# Patient Record
Sex: Male | Born: 1960 | Race: White | Hispanic: No | Marital: Married | Smoking: Former smoker
Health system: Southern US, Community
[De-identification: ages and names within clinical notes are randomized; demographics above are authoritative.]

## PROBLEM LIST (undated history)

## (undated) DIAGNOSIS — K2270 Barrett's esophagus without dysplasia: Secondary | ICD-10-CM

## (undated) DIAGNOSIS — Z8701 Personal history of pneumonia (recurrent): Secondary | ICD-10-CM

## (undated) DIAGNOSIS — J449 Chronic obstructive pulmonary disease, unspecified: Secondary | ICD-10-CM

## (undated) DIAGNOSIS — I4891 Unspecified atrial fibrillation: Principal | ICD-10-CM

## (undated) DIAGNOSIS — G8929 Other chronic pain: Secondary | ICD-10-CM

## (undated) DIAGNOSIS — D751 Secondary polycythemia: Secondary | ICD-10-CM

## (undated) DIAGNOSIS — M25512 Pain in left shoulder: Secondary | ICD-10-CM

## (undated) DIAGNOSIS — K219 Gastro-esophageal reflux disease without esophagitis: Secondary | ICD-10-CM

## (undated) DIAGNOSIS — M549 Dorsalgia, unspecified: Secondary | ICD-10-CM

## (undated) DIAGNOSIS — J189 Pneumonia, unspecified organism: Secondary | ICD-10-CM

## (undated) DIAGNOSIS — F17200 Nicotine dependence, unspecified, uncomplicated: Secondary | ICD-10-CM

## (undated) DIAGNOSIS — R7303 Prediabetes: Secondary | ICD-10-CM

## (undated) DIAGNOSIS — M359 Systemic involvement of connective tissue, unspecified: Secondary | ICD-10-CM

## (undated) DIAGNOSIS — E119 Type 2 diabetes mellitus without complications: Secondary | ICD-10-CM

## (undated) DIAGNOSIS — I2510 Atherosclerotic heart disease of native coronary artery without angina pectoris: Secondary | ICD-10-CM

## (undated) DIAGNOSIS — Z9981 Dependence on supplemental oxygen: Secondary | ICD-10-CM

## (undated) DIAGNOSIS — R911 Solitary pulmonary nodule: Secondary | ICD-10-CM

## (undated) DIAGNOSIS — IMO0002 Reserved for concepts with insufficient information to code with codable children: Secondary | ICD-10-CM

## (undated) DIAGNOSIS — E7800 Pure hypercholesterolemia, unspecified: Secondary | ICD-10-CM

## (undated) DIAGNOSIS — J40 Bronchitis, not specified as acute or chronic: Secondary | ICD-10-CM

## (undated) DIAGNOSIS — I5032 Chronic diastolic (congestive) heart failure: Secondary | ICD-10-CM

## (undated) DIAGNOSIS — R569 Unspecified convulsions: Secondary | ICD-10-CM

## (undated) DIAGNOSIS — J9610 Chronic respiratory failure, unspecified whether with hypoxia or hypercapnia: Secondary | ICD-10-CM

## (undated) DIAGNOSIS — J984 Other disorders of lung: Secondary | ICD-10-CM

## (undated) DIAGNOSIS — I5030 Unspecified diastolic (congestive) heart failure: Secondary | ICD-10-CM

## (undated) DIAGNOSIS — M542 Cervicalgia: Secondary | ICD-10-CM

## (undated) HISTORY — DX: Chronic obstructive pulmonary disease, unspecified: J44.9

## (undated) HISTORY — PX: OTHER SURGICAL HISTORY: SHX169

## (undated) HISTORY — DX: Other disorders of lung: J98.4

## (undated) HISTORY — PX: VASECTOMY: SHX75

## (undated) HISTORY — DX: Secondary polycythemia: D75.1

## (undated) HISTORY — PX: LUNG BIOPSY: SHX232

## (undated) HISTORY — DX: Unspecified convulsions: R56.9

## (undated) HISTORY — DX: Reserved for concepts with insufficient information to code with codable children: IMO0002

## (undated) HISTORY — DX: Nicotine dependence, unspecified, uncomplicated: F17.200

## (undated) HISTORY — DX: Pure hypercholesterolemia, unspecified: E78.00

---

## 1985-07-14 HISTORY — PX: VASECTOMY: SHX75

## 1999-02-28 ENCOUNTER — Observation Stay (HOSPITAL_COMMUNITY): Admission: RE | Admit: 1999-02-28 | Discharge: 1999-03-01 | Payer: Self-pay | Admitting: Cardiovascular Disease

## 1999-02-28 ENCOUNTER — Encounter: Payer: Self-pay | Admitting: Cardiovascular Disease

## 1999-05-27 ENCOUNTER — Encounter: Payer: Self-pay | Admitting: Neurosurgery

## 1999-05-27 ENCOUNTER — Ambulatory Visit (HOSPITAL_COMMUNITY): Admission: RE | Admit: 1999-05-27 | Discharge: 1999-05-27 | Payer: Self-pay | Admitting: Neurosurgery

## 1999-09-04 ENCOUNTER — Encounter: Payer: Self-pay | Admitting: Emergency Medicine

## 1999-09-04 ENCOUNTER — Emergency Department (HOSPITAL_COMMUNITY): Admission: EM | Admit: 1999-09-04 | Discharge: 1999-09-04 | Payer: Self-pay | Admitting: Emergency Medicine

## 2000-09-17 ENCOUNTER — Encounter: Payer: Self-pay | Admitting: Emergency Medicine

## 2000-09-17 ENCOUNTER — Emergency Department (HOSPITAL_COMMUNITY): Admission: EM | Admit: 2000-09-17 | Discharge: 2000-09-17 | Payer: Self-pay | Admitting: Emergency Medicine

## 2003-08-25 ENCOUNTER — Emergency Department (HOSPITAL_COMMUNITY): Admission: EM | Admit: 2003-08-25 | Discharge: 2003-08-25 | Payer: Self-pay

## 2003-08-27 ENCOUNTER — Emergency Department (HOSPITAL_COMMUNITY): Admission: EM | Admit: 2003-08-27 | Discharge: 2003-08-27 | Payer: Self-pay | Admitting: Emergency Medicine

## 2003-09-07 ENCOUNTER — Emergency Department (HOSPITAL_COMMUNITY): Admission: EM | Admit: 2003-09-07 | Discharge: 2003-09-07 | Payer: Self-pay | Admitting: Family Medicine

## 2004-07-17 ENCOUNTER — Emergency Department (HOSPITAL_COMMUNITY): Admission: EM | Admit: 2004-07-17 | Discharge: 2004-07-18 | Payer: Self-pay | Admitting: Emergency Medicine

## 2004-11-11 IMAGING — CR DG CHEST 1V PORT
1 series · 1 of 1 positions shown · non-contrast
Comparison: none

CLINICAL DATA: Chest pain.
 PORTABLE CHEST, 08/25/03, [DATE] HOURS
 Compared to a chest x-ray of 09/17/00, the lungs remain very hyperaerated consistent with COPD.  No active process is seen.  The heart is within normal limits in size.
 IMPRESSION 
 Severe COPD.  No active lung disease.

[view not recorded]
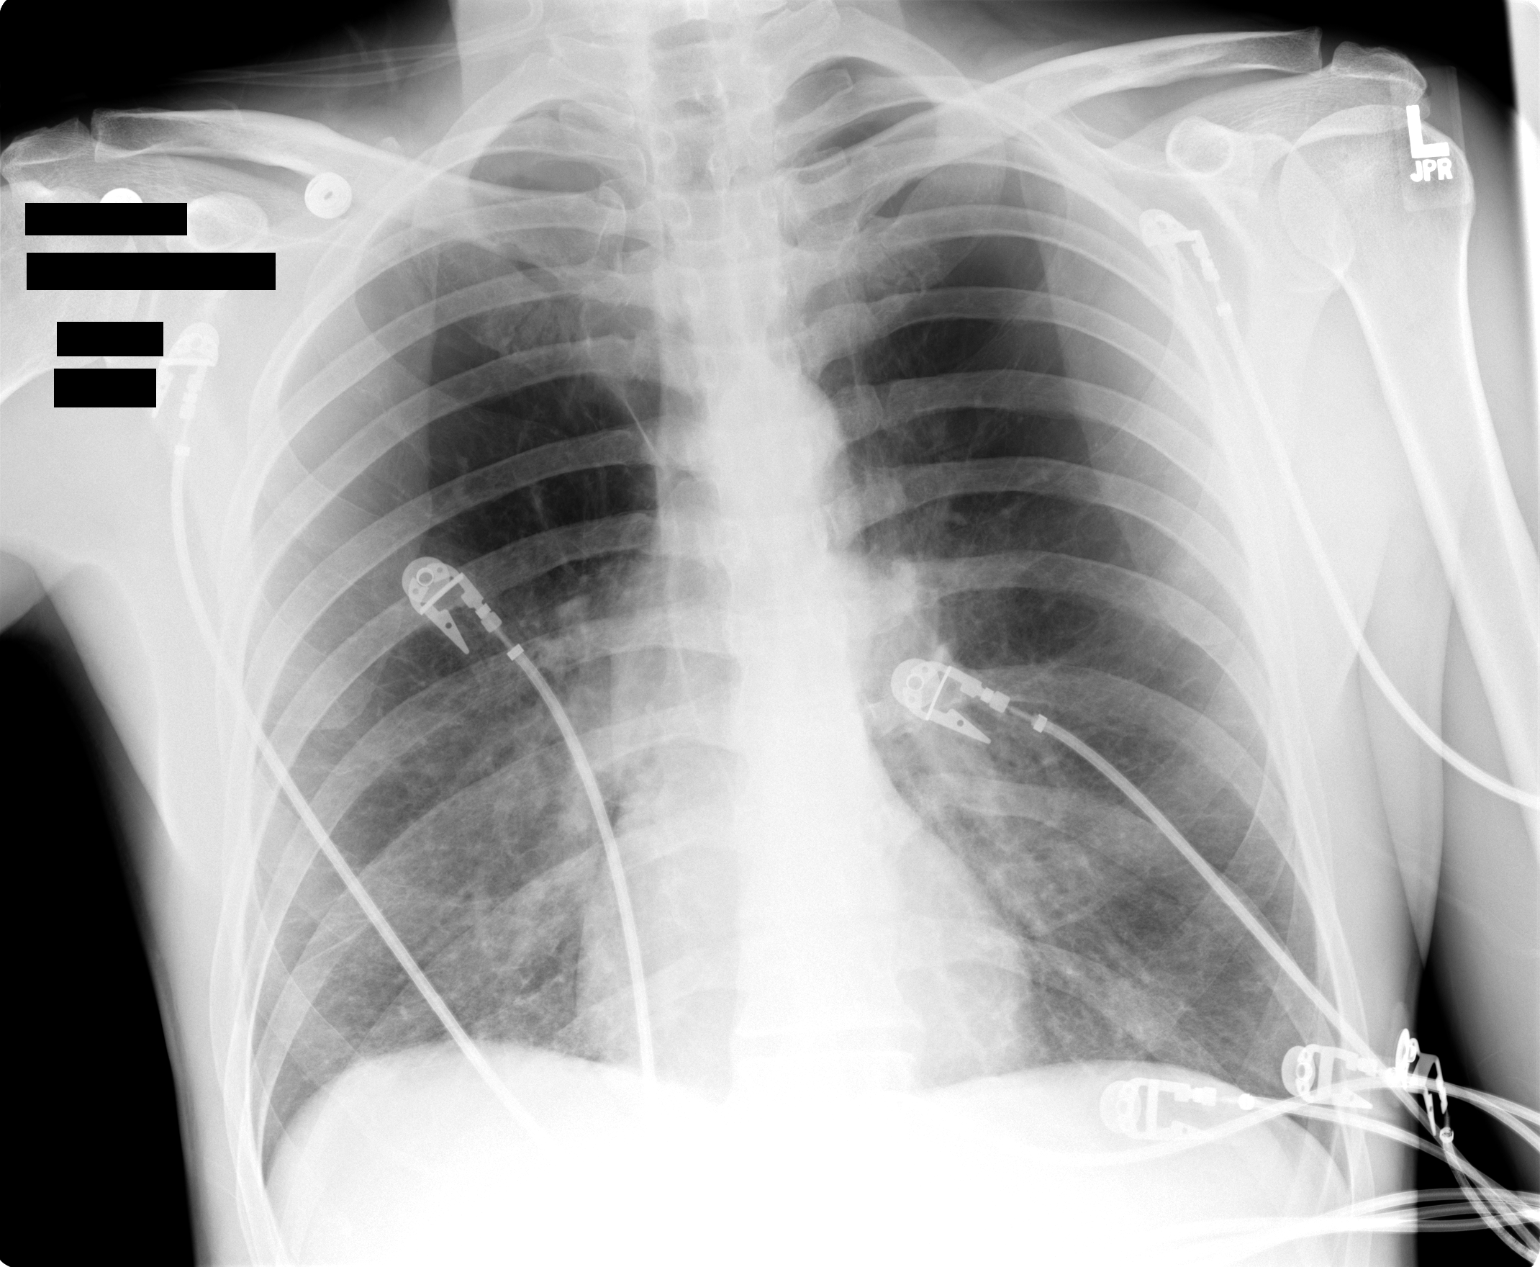

[1 of 1 positions shown; findings below may reference images not displayed]
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
## 2005-01-06 ENCOUNTER — Emergency Department (HOSPITAL_COMMUNITY): Admission: EM | Admit: 2005-01-06 | Discharge: 2005-01-06 | Payer: Self-pay | Admitting: Emergency Medicine

## 2005-09-14 ENCOUNTER — Emergency Department (HOSPITAL_COMMUNITY): Admission: EM | Admit: 2005-09-14 | Discharge: 2005-09-14 | Payer: Self-pay | Admitting: Family Medicine

## 2005-09-16 ENCOUNTER — Emergency Department (HOSPITAL_COMMUNITY): Admission: EM | Admit: 2005-09-16 | Discharge: 2005-09-16 | Payer: Self-pay | Admitting: Family Medicine

## 2005-10-16 ENCOUNTER — Inpatient Hospital Stay (HOSPITAL_COMMUNITY): Admission: EM | Admit: 2005-10-16 | Discharge: 2005-10-16 | Payer: Self-pay | Admitting: Emergency Medicine

## 2005-10-16 ENCOUNTER — Ambulatory Visit: Payer: Self-pay | Admitting: Internal Medicine

## 2005-10-18 ENCOUNTER — Emergency Department (HOSPITAL_COMMUNITY): Admission: EM | Admit: 2005-10-18 | Discharge: 2005-10-18 | Payer: Self-pay | Admitting: Emergency Medicine

## 2005-10-20 ENCOUNTER — Emergency Department (HOSPITAL_COMMUNITY): Admission: EM | Admit: 2005-10-20 | Discharge: 2005-10-20 | Payer: Self-pay | Admitting: Emergency Medicine

## 2005-10-22 ENCOUNTER — Emergency Department (HOSPITAL_COMMUNITY): Admission: EM | Admit: 2005-10-22 | Discharge: 2005-10-22 | Payer: Self-pay | Admitting: Emergency Medicine

## 2005-10-27 ENCOUNTER — Ambulatory Visit: Payer: Self-pay | Admitting: Internal Medicine

## 2005-10-30 ENCOUNTER — Emergency Department (HOSPITAL_COMMUNITY): Admission: EM | Admit: 2005-10-30 | Discharge: 2005-10-31 | Payer: Self-pay | Admitting: Emergency Medicine

## 2005-11-01 ENCOUNTER — Emergency Department (HOSPITAL_COMMUNITY): Admission: EM | Admit: 2005-11-01 | Discharge: 2005-11-01 | Payer: Self-pay | Admitting: Emergency Medicine

## 2006-02-14 ENCOUNTER — Emergency Department (HOSPITAL_COMMUNITY): Admission: EM | Admit: 2006-02-14 | Discharge: 2006-02-14 | Payer: Self-pay | Admitting: Family Medicine

## 2006-05-26 ENCOUNTER — Emergency Department (HOSPITAL_COMMUNITY): Admission: EM | Admit: 2006-05-26 | Discharge: 2006-05-26 | Payer: Self-pay | Admitting: Emergency Medicine

## 2006-08-17 ENCOUNTER — Emergency Department (HOSPITAL_COMMUNITY): Admission: EM | Admit: 2006-08-17 | Discharge: 2006-08-17 | Payer: Self-pay | Admitting: Family Medicine

## 2007-01-03 IMAGING — CR DG CHEST 2V
2 series · 2 of 2 positions shown · non-contrast
Comparison: none

CLINICAL DATA: Cough.  Chest pain and pressure.  History of emphysema.  Left arm and finger numbness. 
 CHEST - 2 VIEW:

[w chest pa]
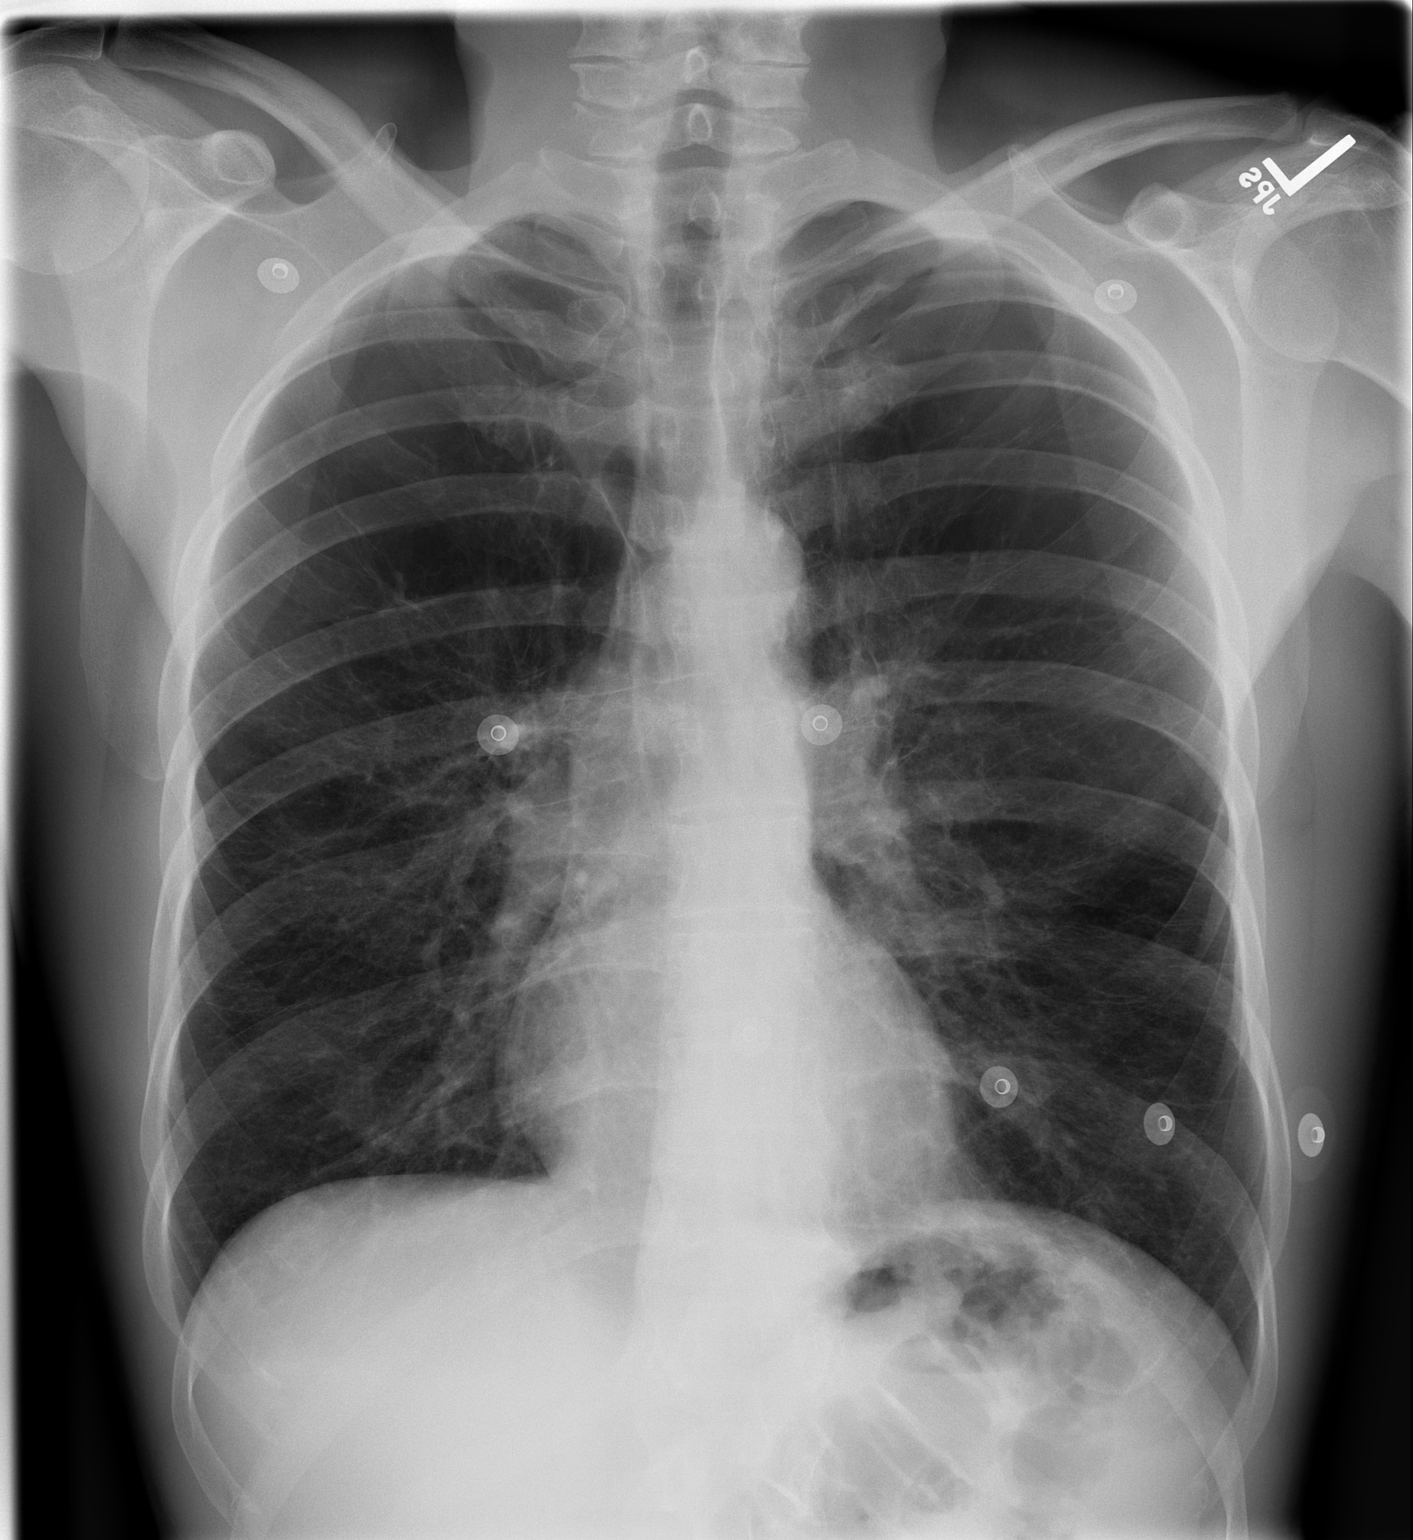

[w chest lat]
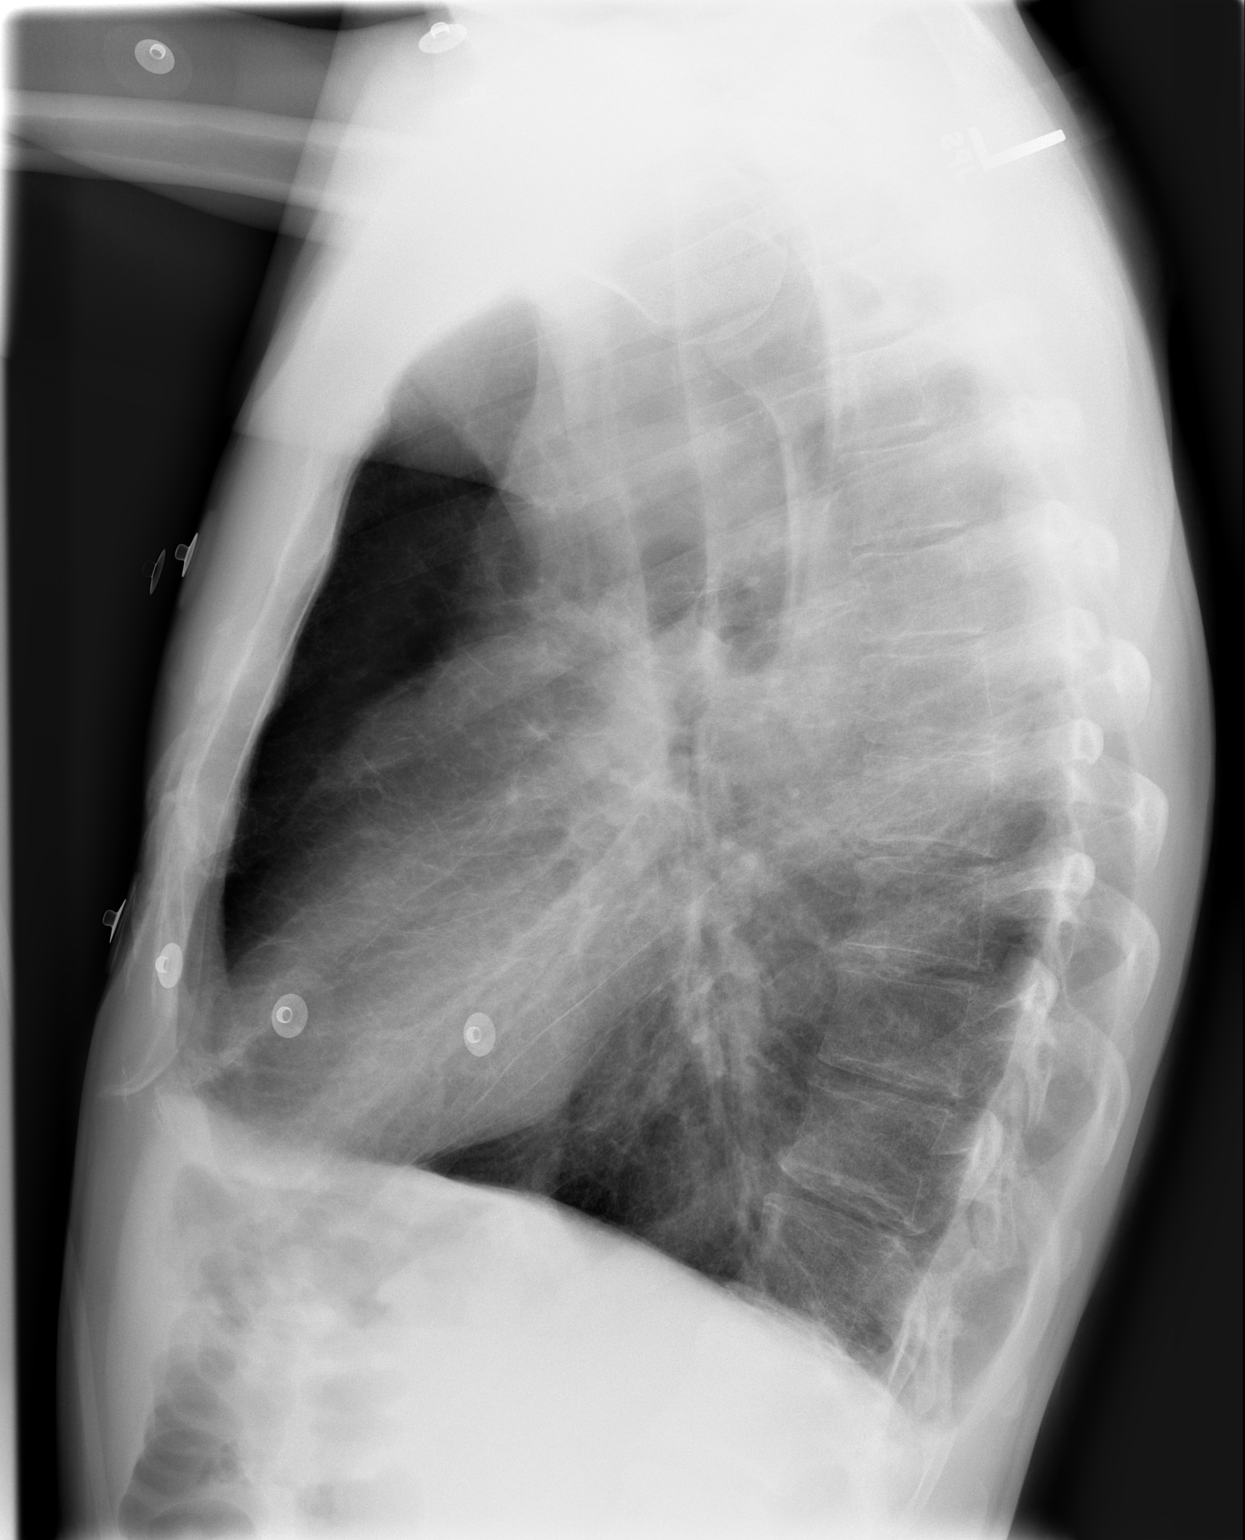

[2 of 2 positions shown; findings below may reference images not displayed]

FINDINGS: PA and lateral views of the chest are made and are compared to previous studies of 08/25/03 and show diffuse peribronchial thickening, flattening of the hemidiaphragms, and increase in AP diameter of the chest consistent with moderate chronic obstructive pulmonary disease.  There is no edema, cardiomegaly, pleural effusion, or pneumothorax.  The bones are within the limits of normal.
IMPRESSION: COPD.  No active infiltrate, cardiomegaly or edema.

## 2007-01-05 IMAGING — CT CT PELVIS W/O CM
2 of 4 series · 17 of 46 positions shown, 19 images · non-contrast
Comparison: Abdomen and pelvis CT of 08/27/03.

CLINICAL DATA: Cardiac catheterization via right femoral approach one week ago, now with right groin pain and abdominal pain.  Clinical concern for retroperitoneal hematoma.
 ABDOMEN CT WITHOUT CONTRAST ? 10/18/05:
TECHNIQUE: Multidetector CT imaging of the abdomen was performed following the standard protocol without IV contrast.
TECHNIQUE: Multidetector CT imaging of the pelvis was performed following the standard protocol without IV contrast.

[Series 2: abd/pelv w/o 5.0 b31f st · axial · non-contrast · 0.61mm/px · z∈[-556,-142]mm · 14 of 91 slices shown, 16 images]
[im 4/91  soft-tissue]
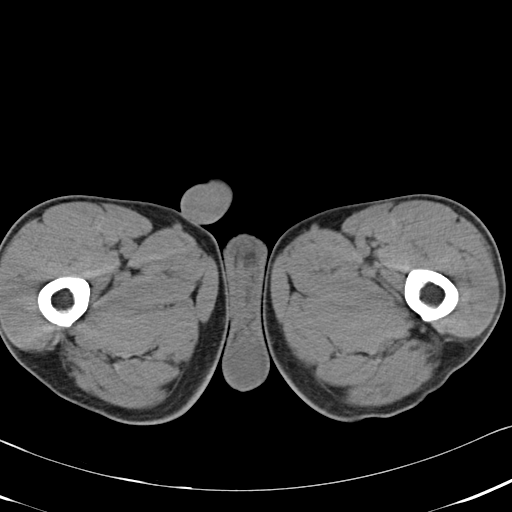
[im 4/91  bone]
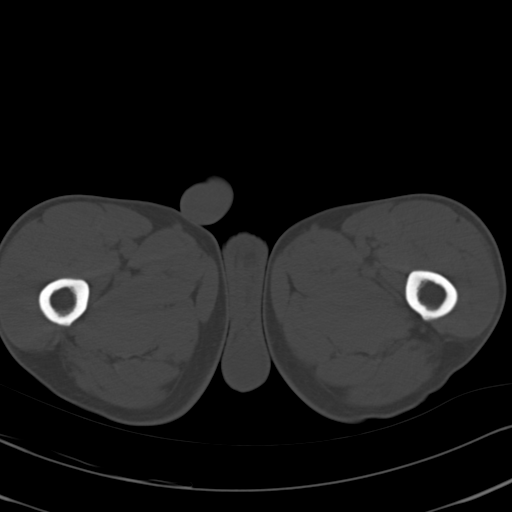
[im 11/91  soft-tissue]
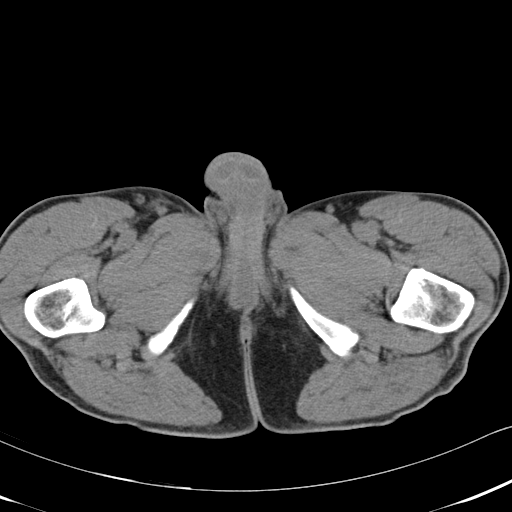
[im 18/91  soft-tissue]
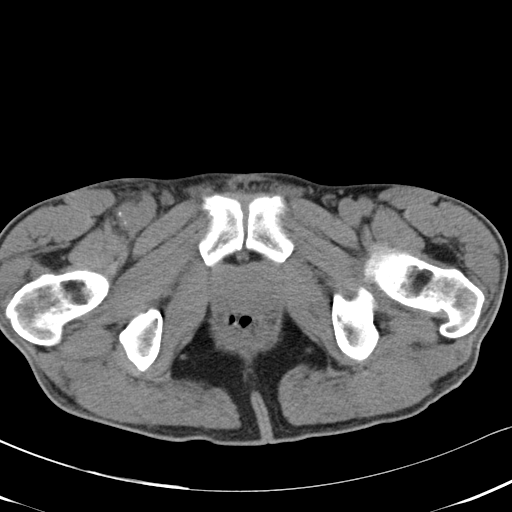
[im 25/91  soft-tissue]
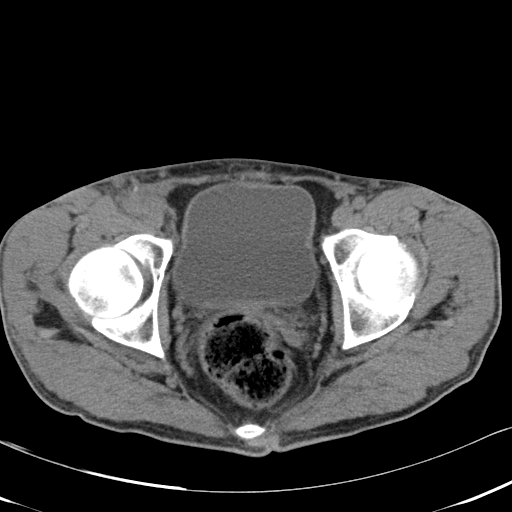
[im 32/91  soft-tissue]
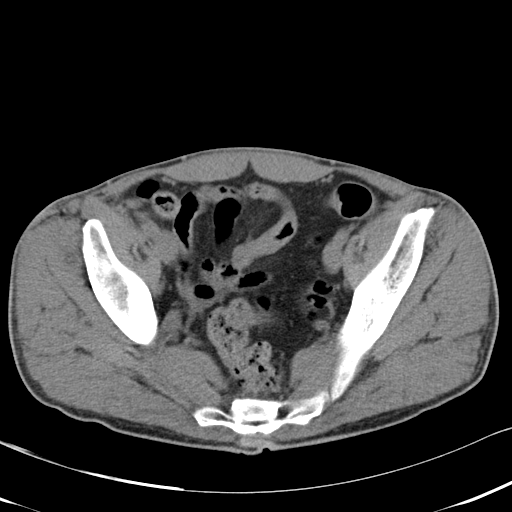
[im 35/91  soft-tissue]
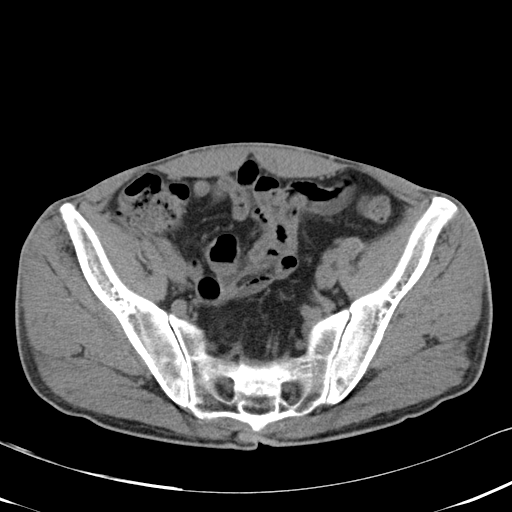
[im 42/91  soft-tissue]
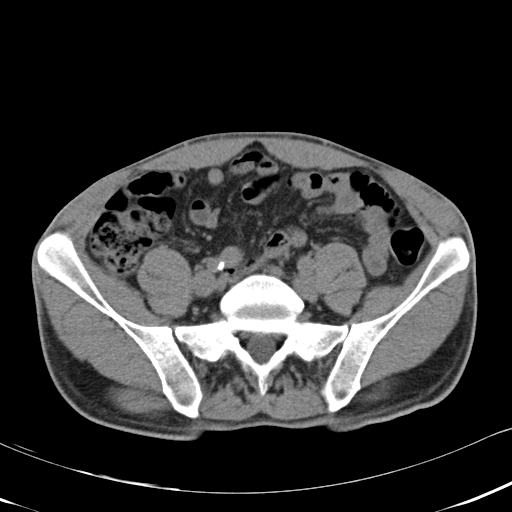
[im 49/91  soft-tissue]
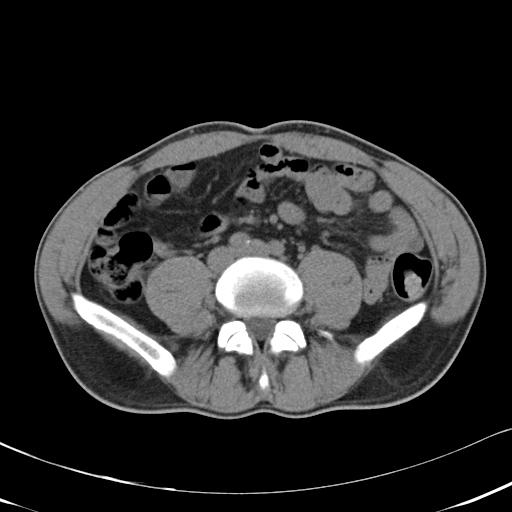
[im 56/91  soft-tissue]
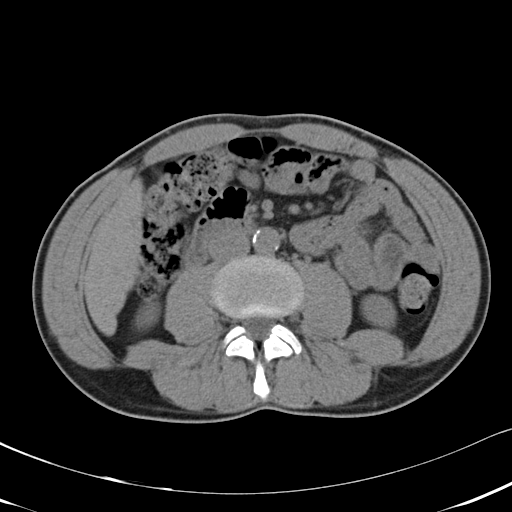
[im 56/91  bone]
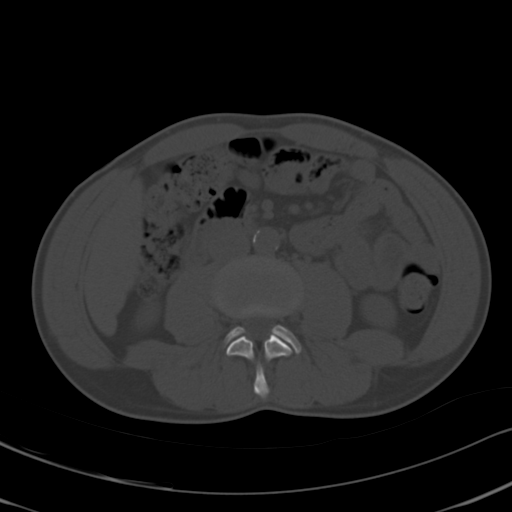
[im 59/91  soft-tissue]
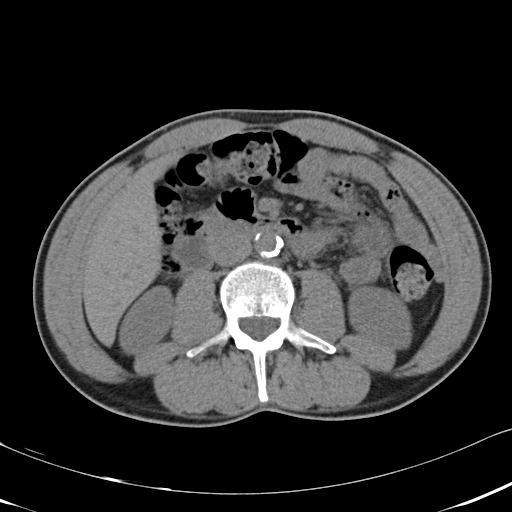
[im 66/91  soft-tissue]
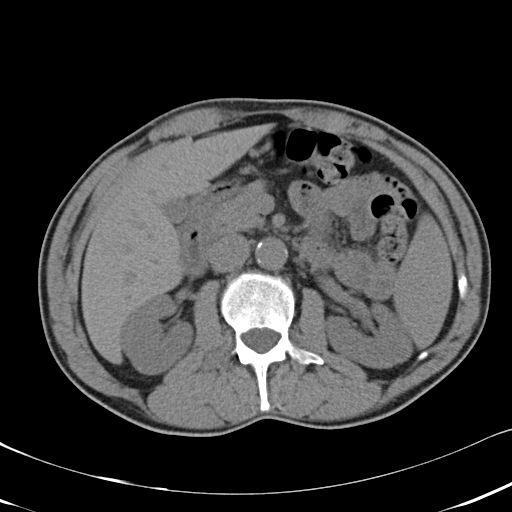
[im 73/91  soft-tissue]
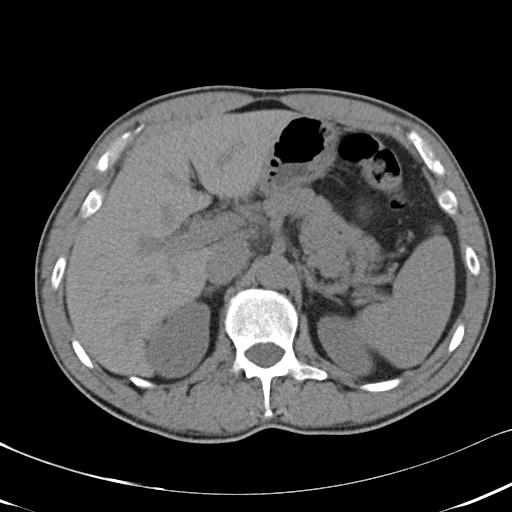
[im 80/91  soft-tissue]
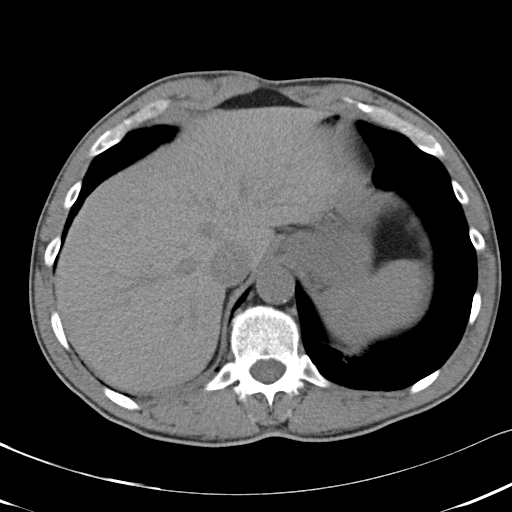
[im 87/91  soft-tissue]
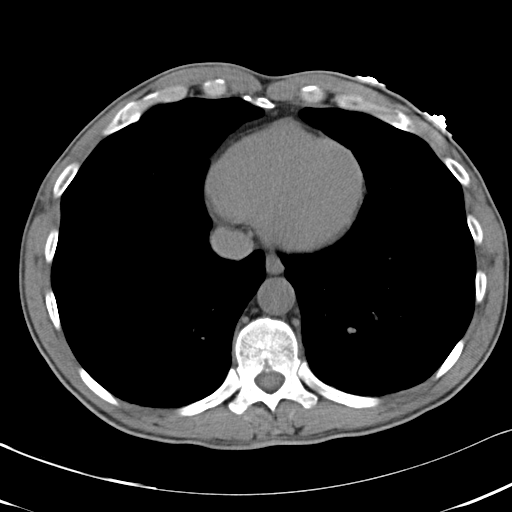

[Series 602: coronals · coronal · 0.88mm/px · 3 of 80 slices shown]
[im 27/80  soft-tissue]
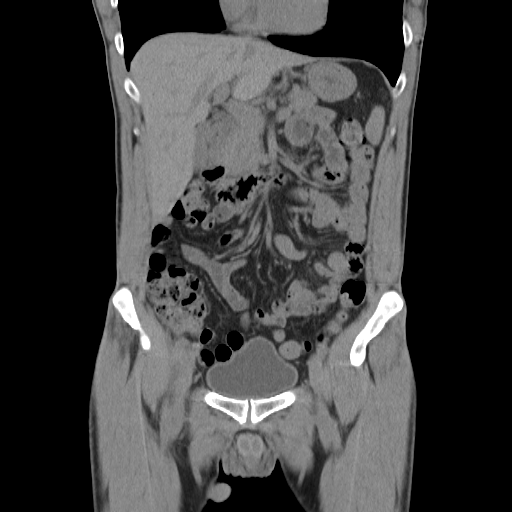
[im 36/80  soft-tissue]
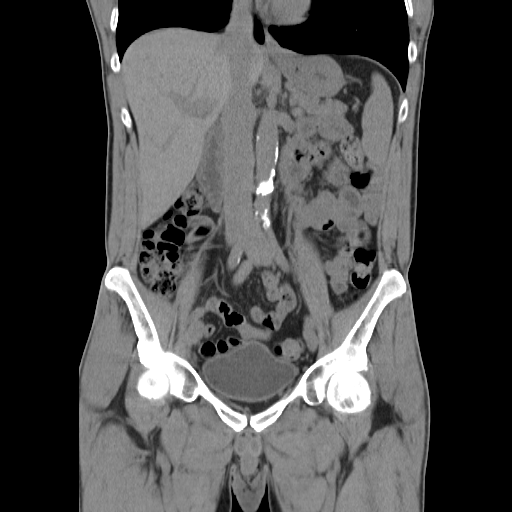
[im 44/80  soft-tissue]
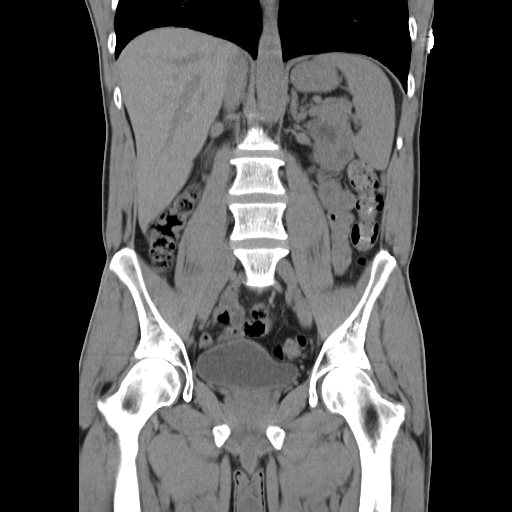

[17 of 46 positions shown; findings below may reference images not displayed]

FINDINGS: Minimal bibasilar subsegmental atelectasis is noted.  Centrilobular emphysematous changes are identified.
 Abdomen:  The abdominal viscera are normal.  No free fluid or lymphadenopathy.  Atheromatous calcification of the aorta is present.
IMPRESSION: No acute intra-abdominal pathology.
 PELVIS CT WITHOUT CONTRAST ? 10/18/05:
FINDINGS: Unopacified bowel and appendix are normal.  No intrapelvic or retroperitoneal free fluid is present.  No lymphadenopathy.  Subcutaneous soft tissue stranding is present in the right groin.  No adjacent focal fluid collection is seen.  Osseous structures are intact.
IMPRESSION: Subcutaneous soft tissue stranding around the right groin catheterization site but no retroperitoneal hematoma or perivascular fluid collection is identified. 
 No acute intrapelvic pathology.

## 2007-01-25 ENCOUNTER — Emergency Department (HOSPITAL_COMMUNITY): Admission: EM | Admit: 2007-01-25 | Discharge: 2007-01-25 | Payer: Self-pay | Admitting: Family Medicine

## 2007-01-27 ENCOUNTER — Emergency Department (HOSPITAL_COMMUNITY): Admission: EM | Admit: 2007-01-27 | Discharge: 2007-01-27 | Payer: Self-pay | Admitting: Emergency Medicine

## 2007-06-17 ENCOUNTER — Emergency Department (HOSPITAL_COMMUNITY): Admission: EM | Admit: 2007-06-17 | Discharge: 2007-06-17 | Payer: Self-pay | Admitting: Family Medicine

## 2007-08-12 ENCOUNTER — Ambulatory Visit: Payer: Self-pay | Admitting: Family Medicine

## 2007-08-13 IMAGING — CR DG CHEST 1V PORT
1 series · 1 of 1 positions shown · non-contrast
Comparison: none

Streak: Dyspnea, cough, dizziness, COPD, coronary artery disease

PORTABLE CHEST ONE VIEW:
Portable exam 3112 hours compared to 10/31/2005
Normal heart size, mediastinal contours, and pulmonary vascularity.
Changes of COPD with bilateral upper lobe bullous disease.
No acute infiltrate, pleural effusion, or pneumothorax.
Minimal interstitial prominence in lower lungs stable.
Mild chronic bronchitic changes.

[view not recorded]
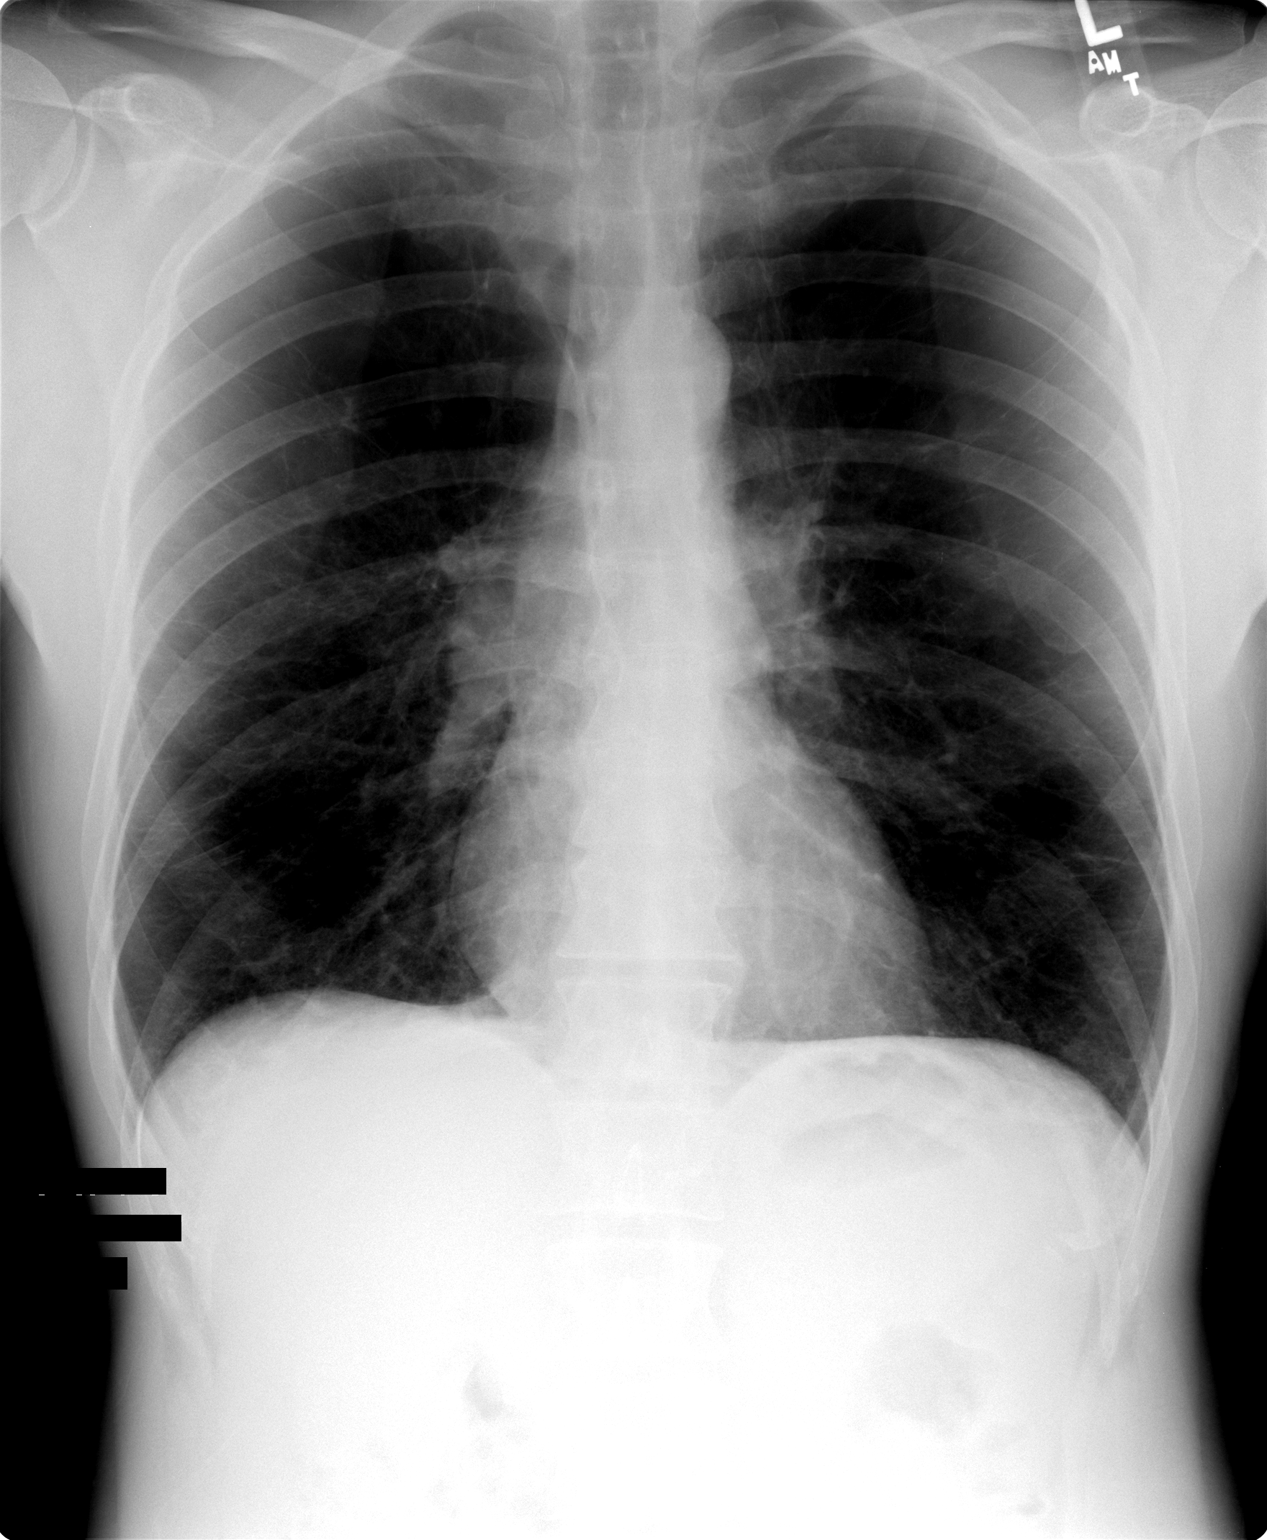

[1 of 1 positions shown; findings below may reference images not displayed]

IMPRESSION: COPD with chronic bronchitic changes, minimal chronic basilar interstitial
disease, and biapical bullous disease.
No acute abnormalities.

## 2007-12-19 ENCOUNTER — Inpatient Hospital Stay (HOSPITAL_COMMUNITY): Admission: EM | Admit: 2007-12-19 | Discharge: 2007-12-21 | Payer: Self-pay | Admitting: Emergency Medicine

## 2007-12-19 ENCOUNTER — Ambulatory Visit: Payer: Self-pay | Admitting: Cardiology

## 2007-12-20 ENCOUNTER — Encounter (INDEPENDENT_AMBULATORY_CARE_PROVIDER_SITE_OTHER): Payer: Self-pay | Admitting: Internal Medicine

## 2007-12-23 ENCOUNTER — Ambulatory Visit: Payer: Self-pay | Admitting: Cardiovascular Disease

## 2007-12-27 ENCOUNTER — Ambulatory Visit: Payer: Self-pay | Admitting: Family Medicine

## 2007-12-27 DIAGNOSIS — I4891 Unspecified atrial fibrillation: Secondary | ICD-10-CM | POA: Insufficient documentation

## 2007-12-27 DIAGNOSIS — J449 Chronic obstructive pulmonary disease, unspecified: Secondary | ICD-10-CM

## 2007-12-27 DIAGNOSIS — E785 Hyperlipidemia, unspecified: Secondary | ICD-10-CM | POA: Insufficient documentation

## 2008-01-02 ENCOUNTER — Emergency Department (HOSPITAL_COMMUNITY): Admission: EM | Admit: 2008-01-02 | Discharge: 2008-01-02 | Payer: Self-pay | Admitting: Emergency Medicine

## 2008-01-05 ENCOUNTER — Ambulatory Visit: Payer: Self-pay | Admitting: Cardiology

## 2008-01-10 ENCOUNTER — Ambulatory Visit: Payer: Self-pay | Admitting: Family Medicine

## 2008-01-12 ENCOUNTER — Telehealth: Payer: Self-pay | Admitting: *Deleted

## 2008-01-12 ENCOUNTER — Ambulatory Visit: Payer: Self-pay | Admitting: Internal Medicine

## 2008-01-20 ENCOUNTER — Ambulatory Visit: Payer: Self-pay | Admitting: Internal Medicine

## 2008-01-20 DIAGNOSIS — J45909 Unspecified asthma, uncomplicated: Secondary | ICD-10-CM

## 2008-01-20 DIAGNOSIS — G4730 Sleep apnea, unspecified: Secondary | ICD-10-CM | POA: Insufficient documentation

## 2008-02-14 ENCOUNTER — Emergency Department (HOSPITAL_COMMUNITY): Admission: EM | Admit: 2008-02-14 | Discharge: 2008-02-14 | Payer: Self-pay | Admitting: Emergency Medicine

## 2008-03-02 ENCOUNTER — Ambulatory Visit: Payer: Self-pay | Admitting: Internal Medicine

## 2008-03-03 LAB — CONVERTED CEMR LAB: A-1 Antitrypsin, Ser: 215 mg/dL — ABNORMAL HIGH (ref 83–200)

## 2008-03-04 ENCOUNTER — Emergency Department (HOSPITAL_COMMUNITY): Admission: EM | Admit: 2008-03-04 | Discharge: 2008-03-04 | Payer: Self-pay | Admitting: Emergency Medicine

## 2008-04-09 ENCOUNTER — Emergency Department (HOSPITAL_COMMUNITY): Admission: EM | Admit: 2008-04-09 | Discharge: 2008-04-09 | Payer: Self-pay | Admitting: Emergency Medicine

## 2008-04-13 IMAGING — CR DG CHEST 2V
2 series · 2 of 2 positions shown · non-contrast
Comparison: 05/26/06.

CLINICAL DATA: 46-year-old with abdominal pain and cough.  
 CHEST ? 2 VIEW:

[view not recorded (1 of 2)]
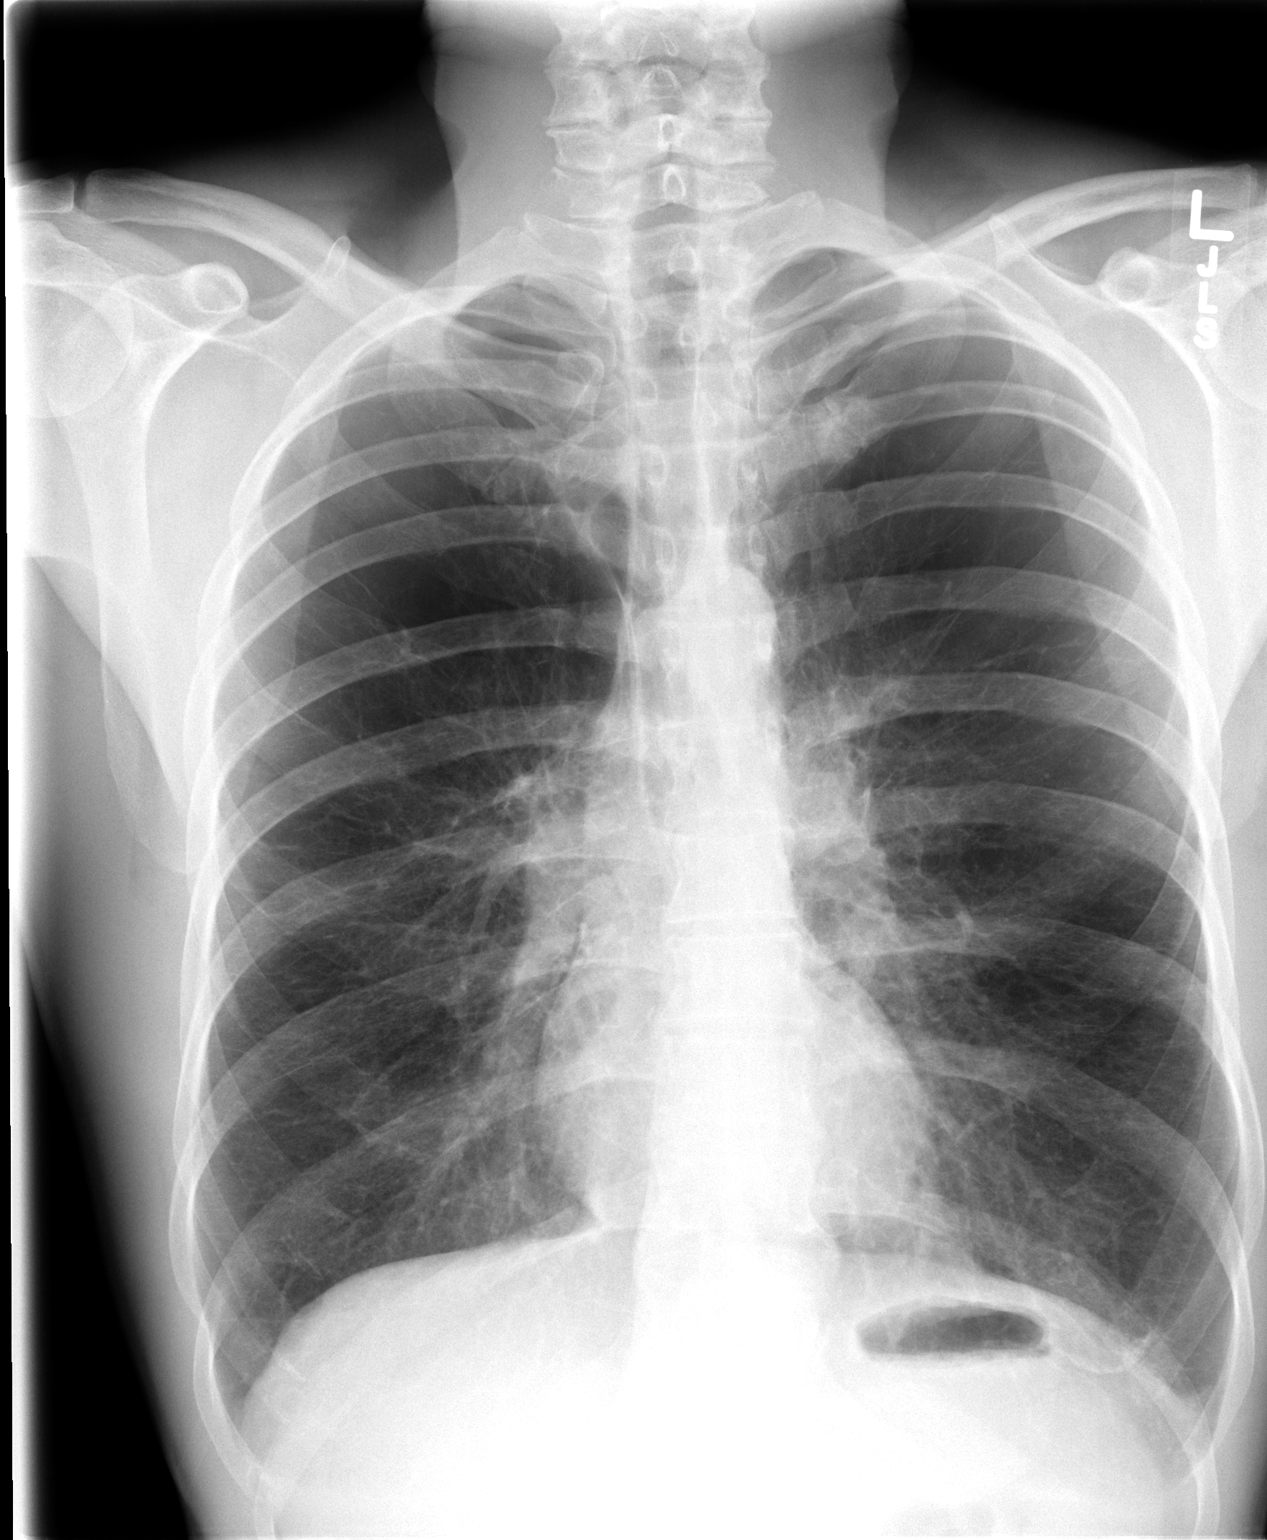

[view not recorded (2 of 2)]
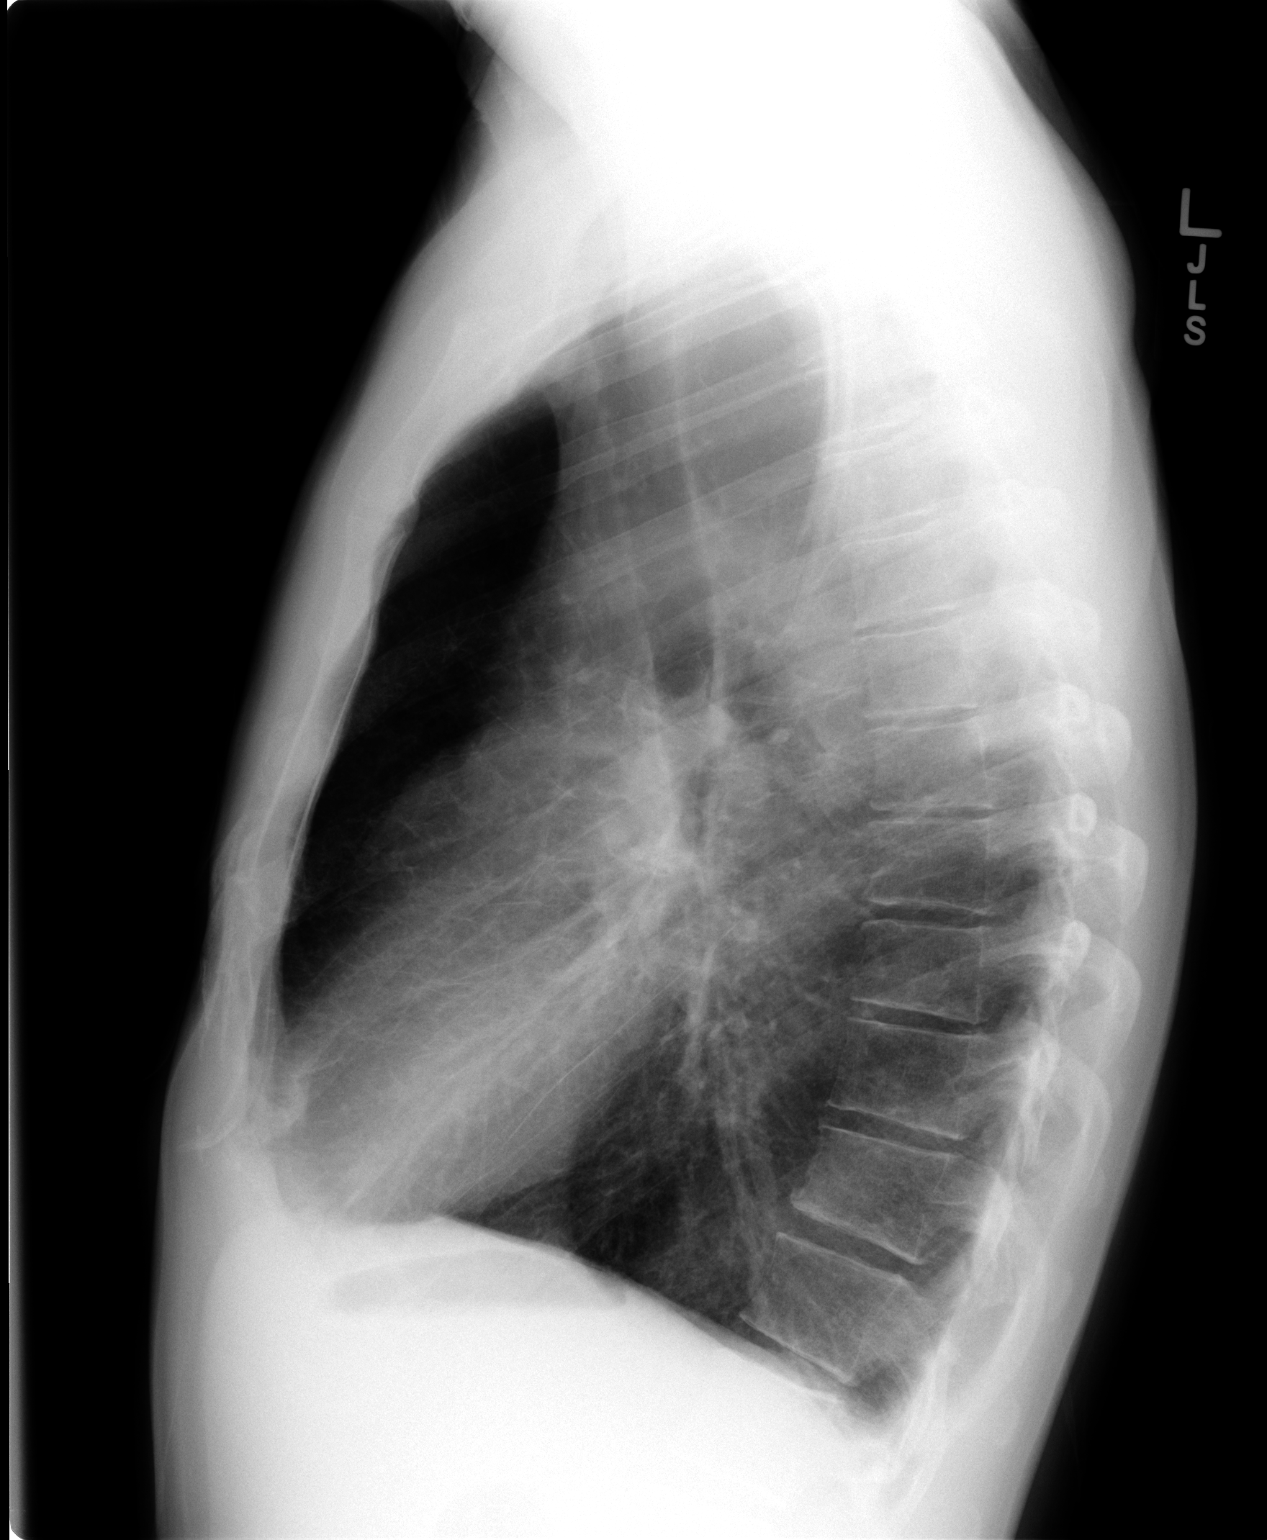

[2 of 2 positions shown; findings below may reference images not displayed]

FINDINGS: Two views of the chest again demonstrate hyperinflation and emphysema.  There is no focal airspace disease.  The heart and mediastinum are stable.  The trachea is midline.  Bony structures are intact.
IMPRESSION: Emphysema without acute findings.

## 2008-05-18 ENCOUNTER — Telehealth: Payer: Self-pay | Admitting: Family Medicine

## 2008-12-05 ENCOUNTER — Ambulatory Visit: Payer: Self-pay | Admitting: Cardiology

## 2008-12-05 ENCOUNTER — Encounter (INDEPENDENT_AMBULATORY_CARE_PROVIDER_SITE_OTHER): Payer: Self-pay | Admitting: Family Medicine

## 2008-12-05 ENCOUNTER — Inpatient Hospital Stay (HOSPITAL_COMMUNITY): Admission: EM | Admit: 2008-12-05 | Discharge: 2008-12-07 | Payer: Self-pay | Admitting: Emergency Medicine

## 2008-12-24 ENCOUNTER — Emergency Department (HOSPITAL_COMMUNITY): Admission: EM | Admit: 2008-12-24 | Discharge: 2008-12-24 | Payer: Self-pay | Admitting: Emergency Medicine

## 2009-03-07 IMAGING — CR DG CHEST 1V PORT
1 series · 1 of 1 positions shown · non-contrast
Comparison: Chest x-ray of 01/25/2007

CLINICAL DATA: Short of breath, palpitations

PORTABLE CHEST - 1 VIEW

[view not recorded]
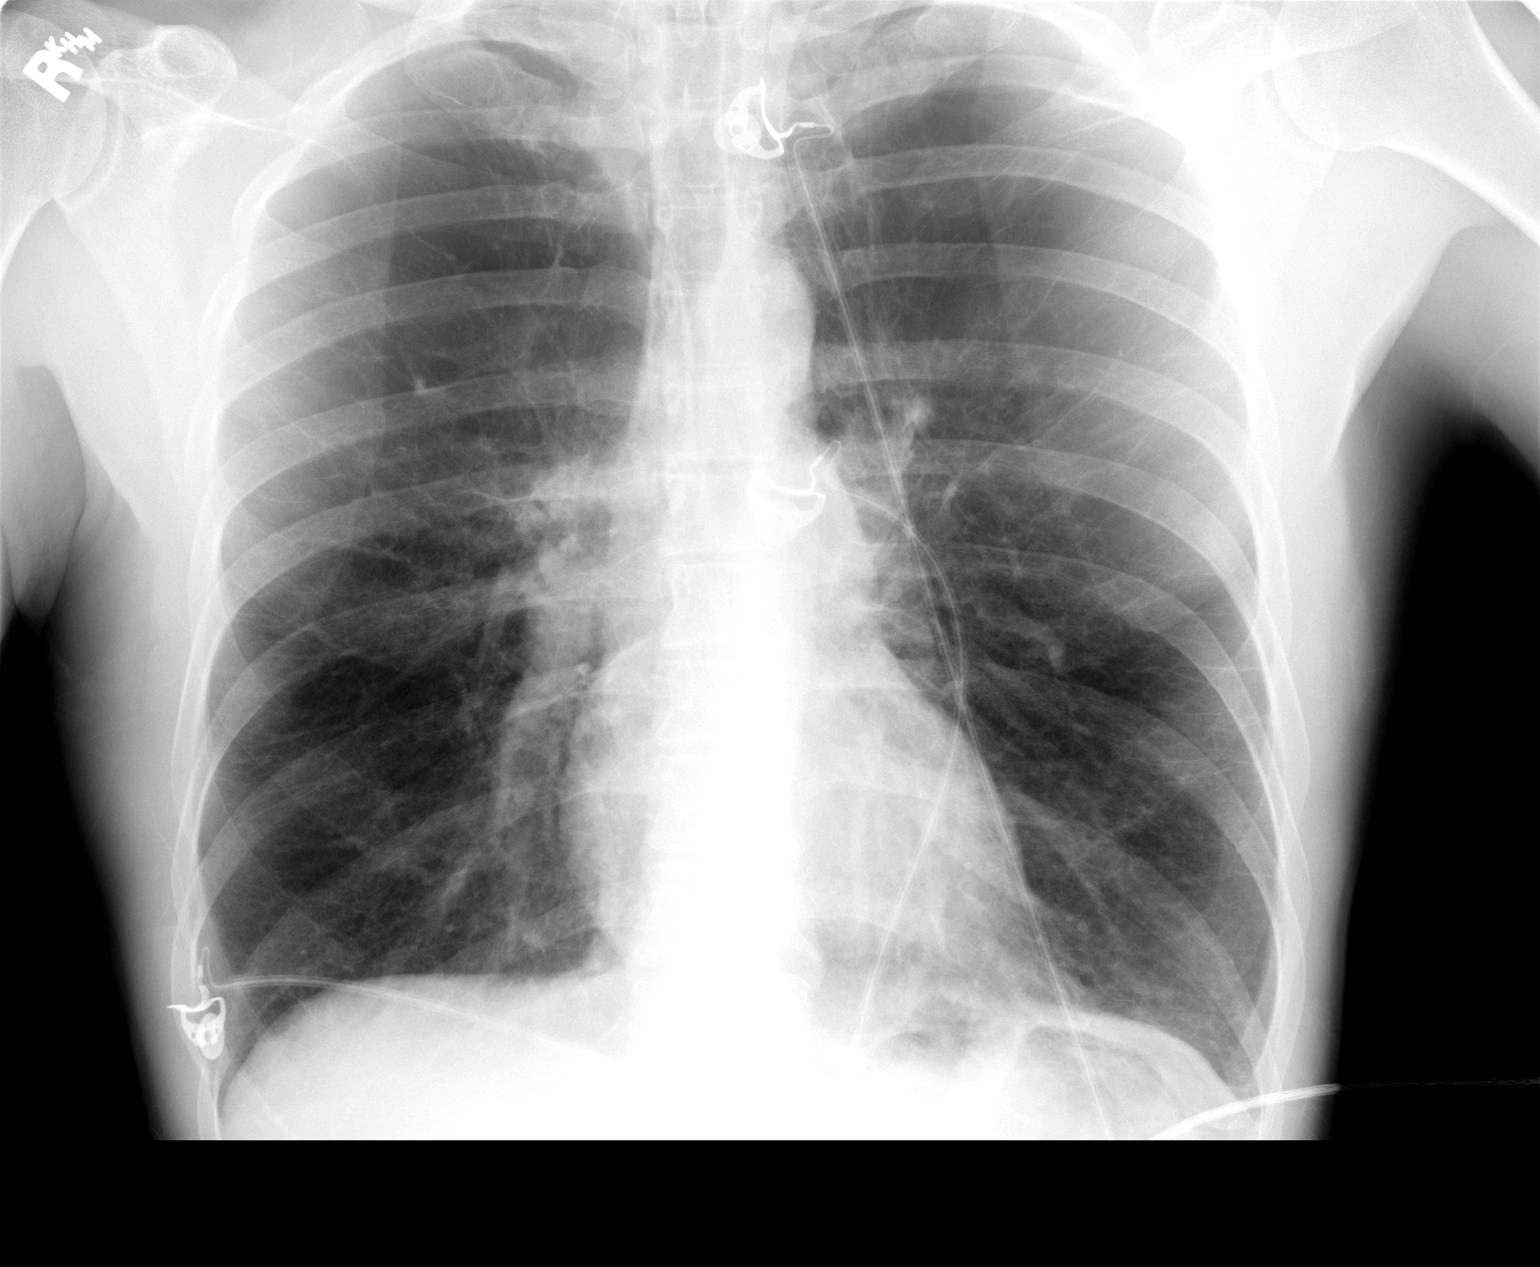

[1 of 1 positions shown; findings below may reference images not displayed]

FINDINGS: The lungs remain hyperaerated consistent with COPD.  No
active infiltrate or effusion is seen.  Heart is within normal
limits in size.
IMPRESSION: COPD.  No active lung disease.

## 2009-03-21 IMAGING — CR DG CHEST 1V PORT
1 series · 1 of 1 positions shown · non-contrast
Comparison: 12/19/2007

CLINICAL DATA: Chest pain and shortness of breath

PORTABLE CHEST - 1 VIEW

[view not recorded]
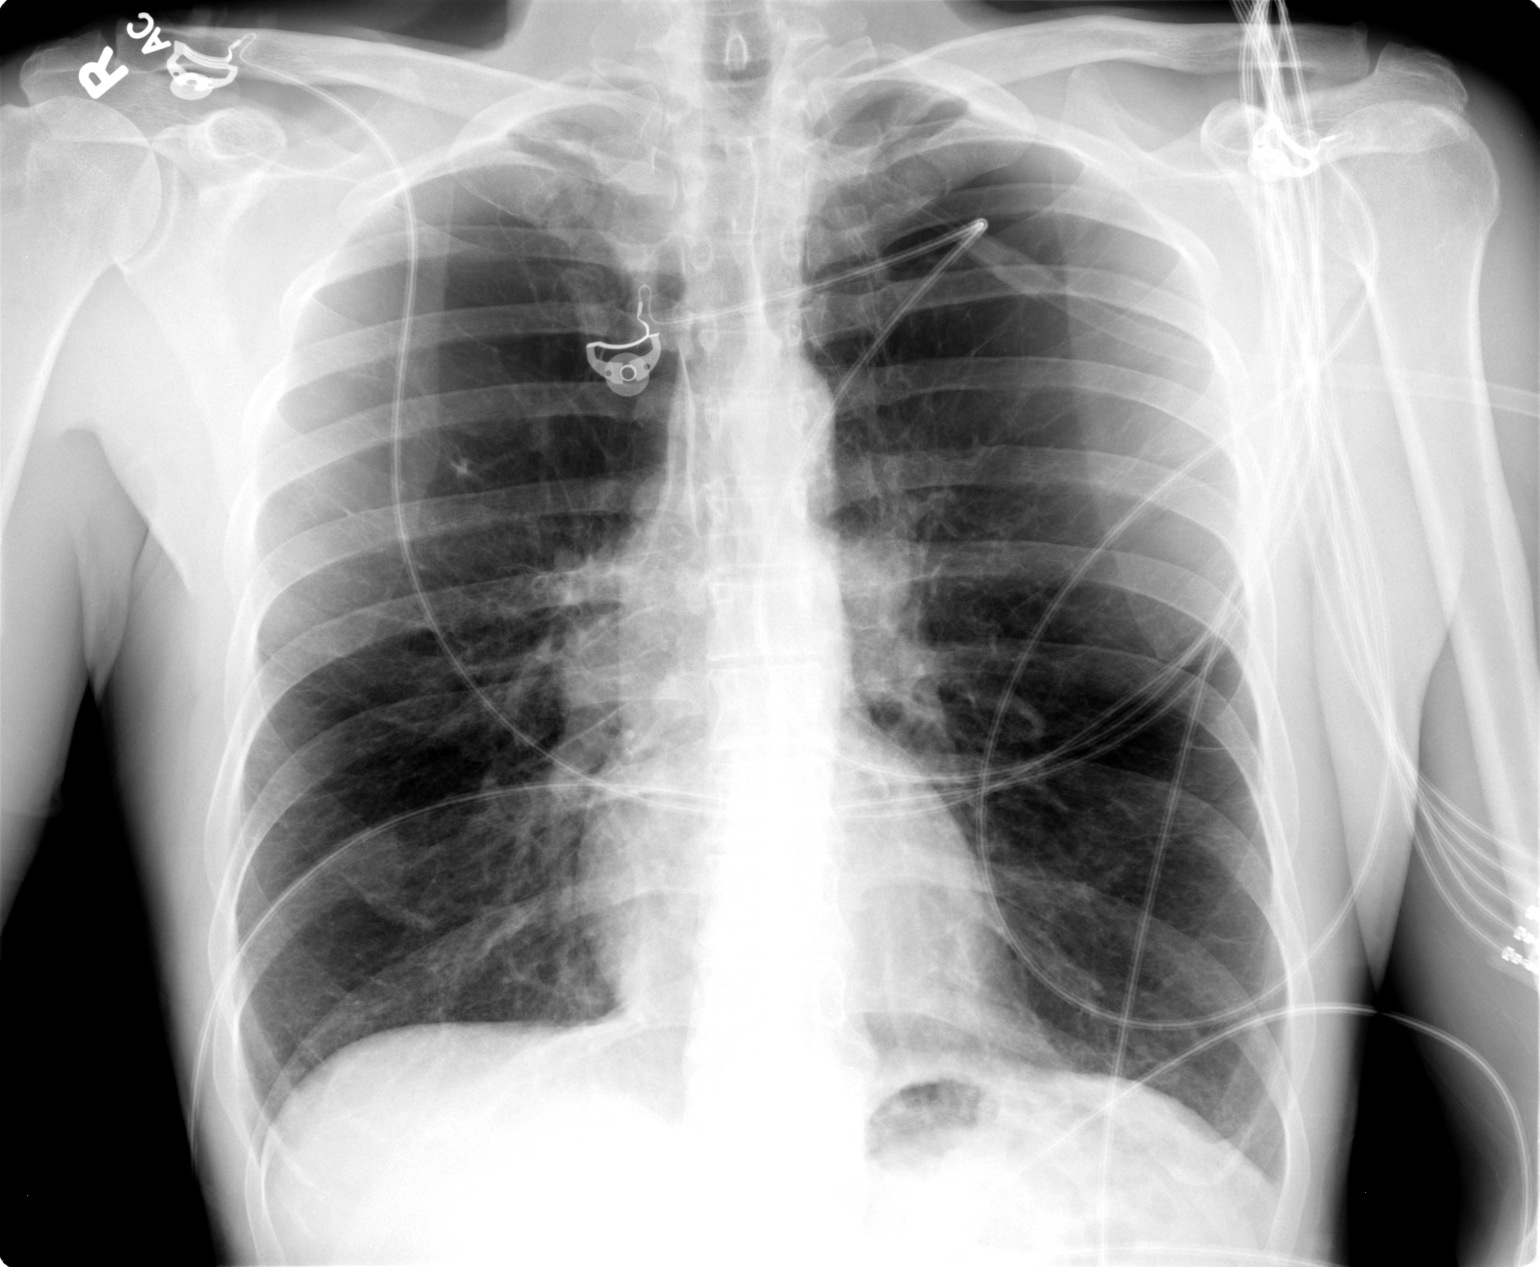

[1 of 1 positions shown; findings below may reference images not displayed]

FINDINGS: Heart size and mediastinal contours are normal.

There is no pleural effusion or pulmonary edema

Marked emphysema involves both lungs.

There are no superimposed airspace densities identified.

Within the right upper lobe there is a 10 mm nodular density.

This is nonspecific and could easily reflect superimposition of
emphysematous lung markings
IMPRESSION: 1.  No evidence for pneumonia or heart failure.
2.  Nonspecific nodular density in the right upper lobe.  Given the
patients advanced emphysema and suspected smoking history I would
suggest a non-emergent , routine, outpatient CT of the chest
without contrast material.

## 2009-03-31 IMAGING — CT CT CHEST W/ CM
2 of 4 series · 15 of 36 positions shown, 18 images · IV contrast (Omnipaque 300)
Comparison: 01/02/2008

CLINICAL DATA: Pulmonary nodule

CT CHEST WITH CONTRAST
TECHNIQUE: Multidetector CT imaging of the chest was performed
following the standard protocol during bolus administration of
intravenous contrast.
Contrast: 80 ml [id number]-[id number]

[Series 2: chest_routine 5.0 b40f st · axial · 0.61mm/px · z∈[-412,-77]mm · 12 of 79 slices shown, 15 images]
[im 6/79  mediastinal]
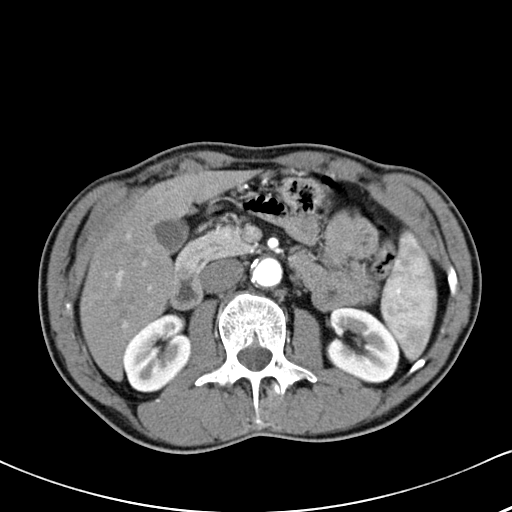
[im 6/79  lung]
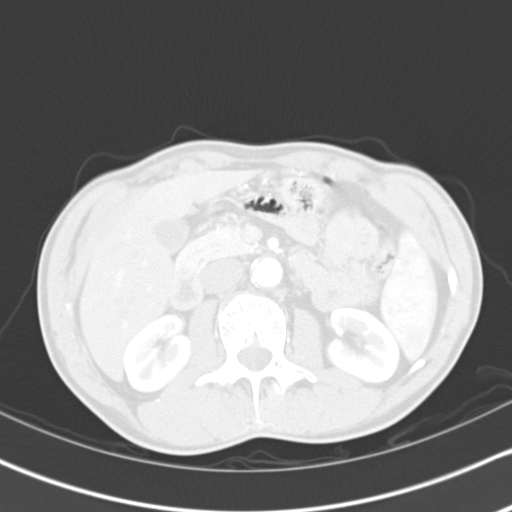
[im 12/79  lung]
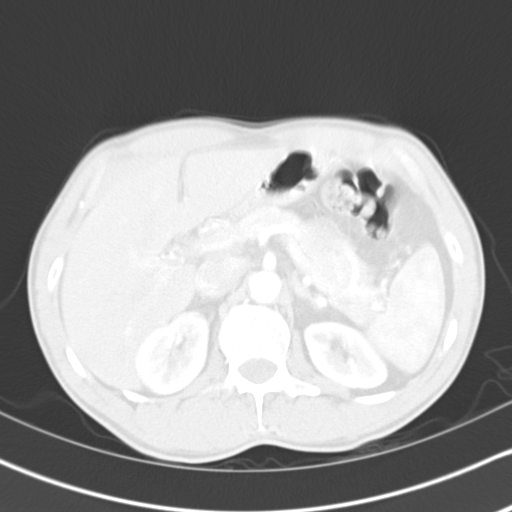
[im 17/79  lung]
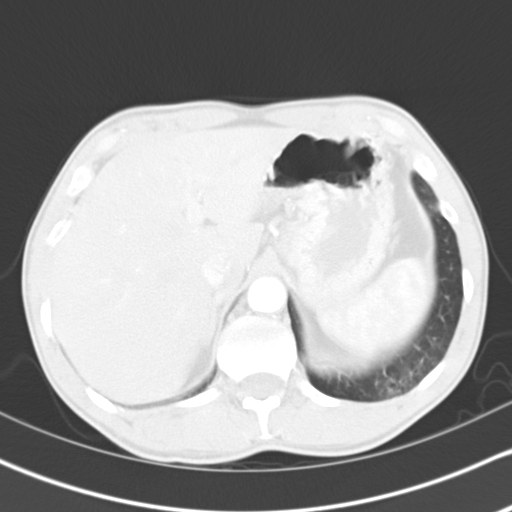
[im 23/79  lung]
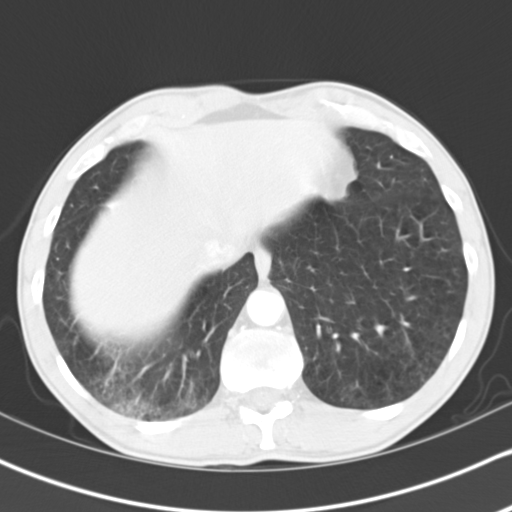
[im 28/79  mediastinal]
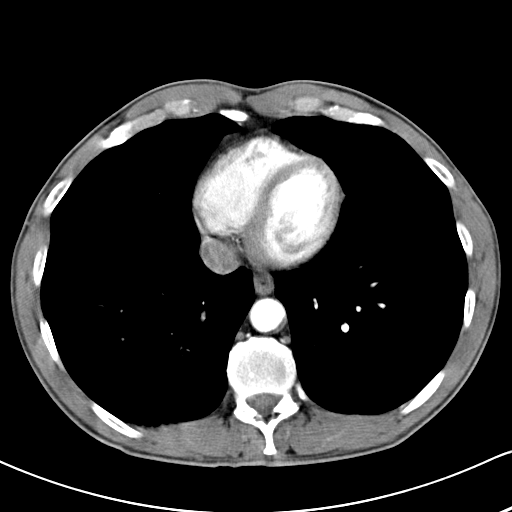
[im 28/79  lung]
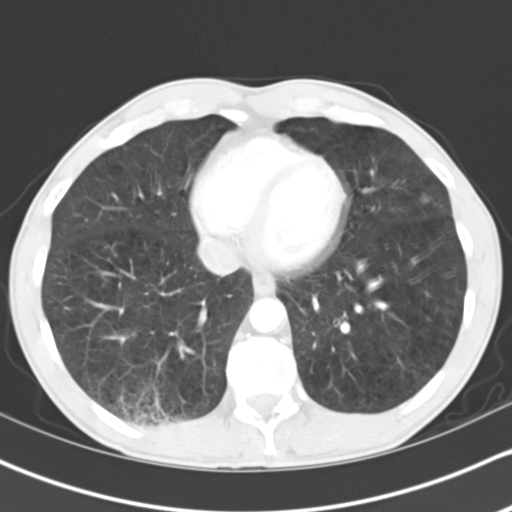
[im 34/79  lung]
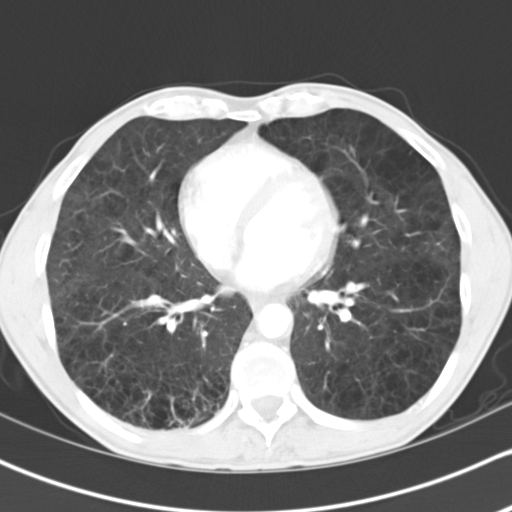
[im 45/79  lung]
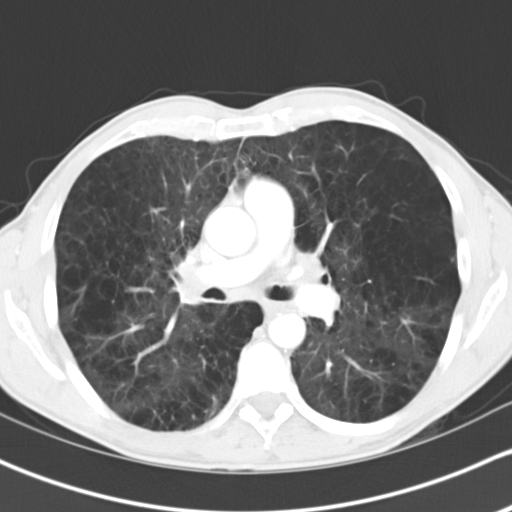
[im 51/79  lung]
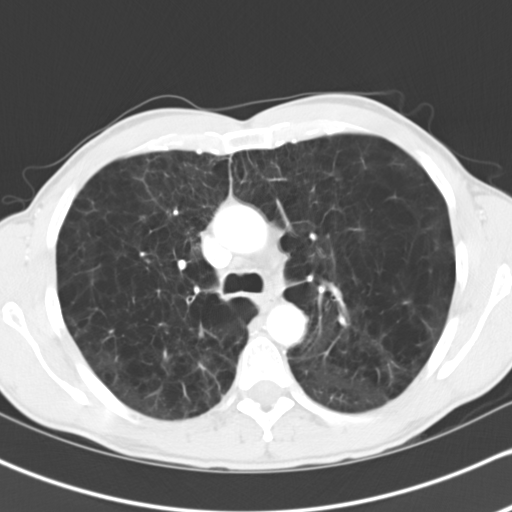
[im 56/79  mediastinal]
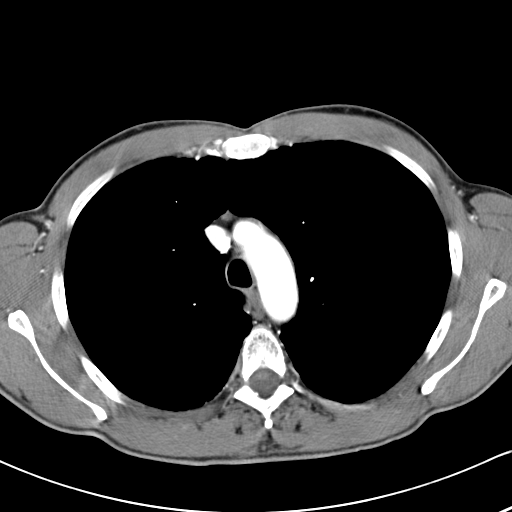
[im 56/79  lung]
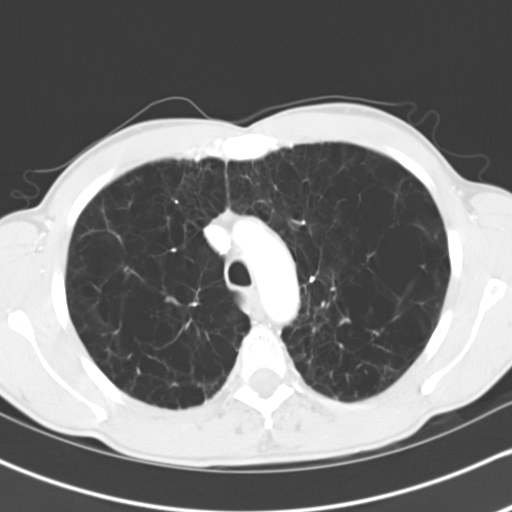
[im 62/79  lung]
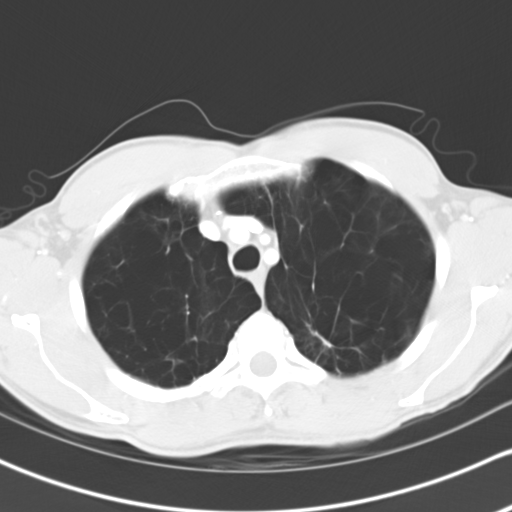
[im 67/79  lung]
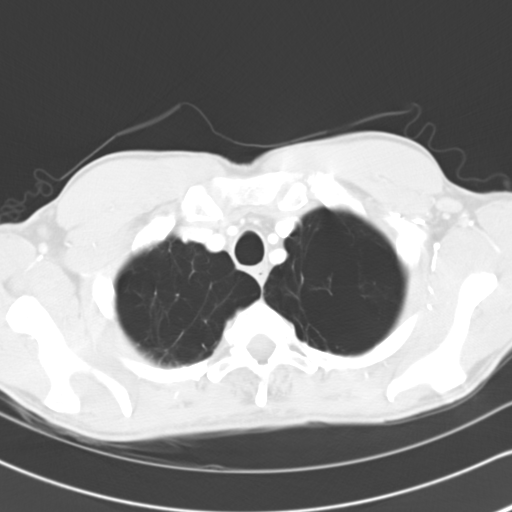
[im 73/79  lung]
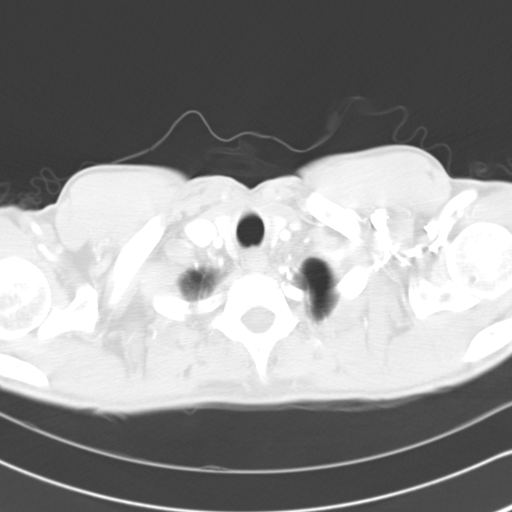

[Series 602: coronal chest · coronal · 0.79mm/px · 3 of 103 slices shown]
[im 21/103  lung]
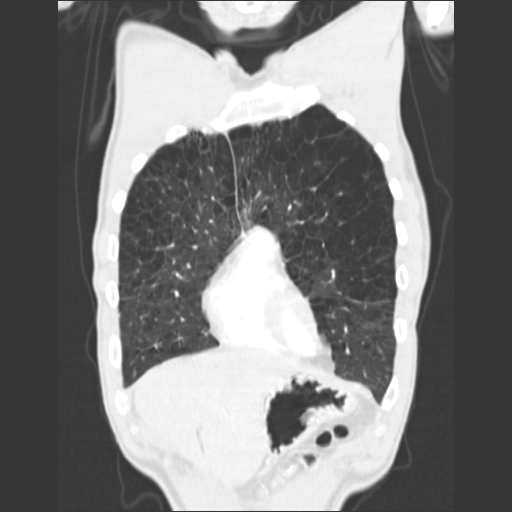
[im 41/103  lung]
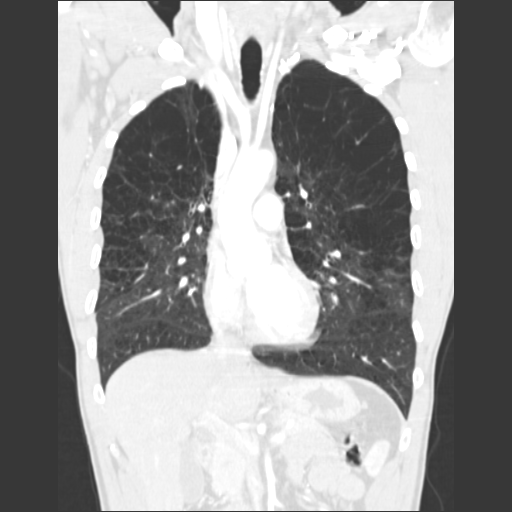
[im 62/103  lung]
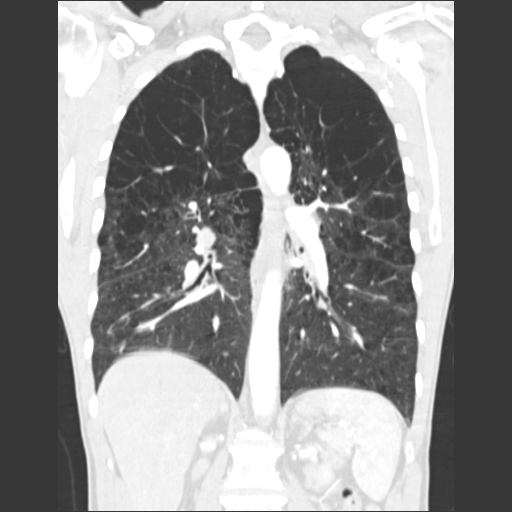

[15 of 36 positions shown; findings below may reference images not displayed]

FINDINGS: Severe COPD changes present in the lungs.  No pulmonary
nodules seen in the right upper lobe in the area questioned by
plain films.  This presumably represented superimposition of
shadows.

Somewhat nodular density noted posteriorly in the left upper lobe.
I suspect this represents scarring.

In the lower lobes bilaterally, coarsened posterior densities are
noted, likely early fibrosis.  No confluent opacities otherwise.
No effusions.

There are mildly prominent bilateral hilar nodes.  Node in the
right hilum measures 15 x 14 mm on image 35.  Left hilar node
measures 16 x 11 mm on image 34.  Small subcarinal node with a
short axis diameter of 7 mm. Small bilateral axillary nodes noted,
none pathologically enlarged.  The largest in the left axilla has a
short axis diameter 8 mm.

Scattered coronary artery calcifications present.  No evidence of
aortic aneurysm or dissection.  Mild atherosclerotic disease in the
large and descending aorta.

Visualized thyroid and chest wall soft tissues unremarkable.
Imaging into the upper abdomen shows no acute findings.
IMPRESSION: No nodules seen in the right upper lobe in the area questioned on
prior chest x-ray.  There is a somewhat nodular density posteriorly
in the left upper lobe which I suspect represents scarring.  There
is also mild bilateral hilar adenopathy.  Given these 2 findings, I
would recommend at least one followup study in 6-12 months.

Severe COPD.

## 2009-05-06 ENCOUNTER — Emergency Department (HOSPITAL_COMMUNITY): Admission: EM | Admit: 2009-05-06 | Discharge: 2009-05-06 | Payer: Self-pay | Admitting: Emergency Medicine

## 2009-05-22 IMAGING — CR DG CHEST 2V
2 series · 2 of 2 positions shown · non-contrast
Comparison: 01/12/2008

CLINICAL DATA: Cough, cold symptoms

CHEST - 2 VIEW

[view not recorded (1 of 2)]
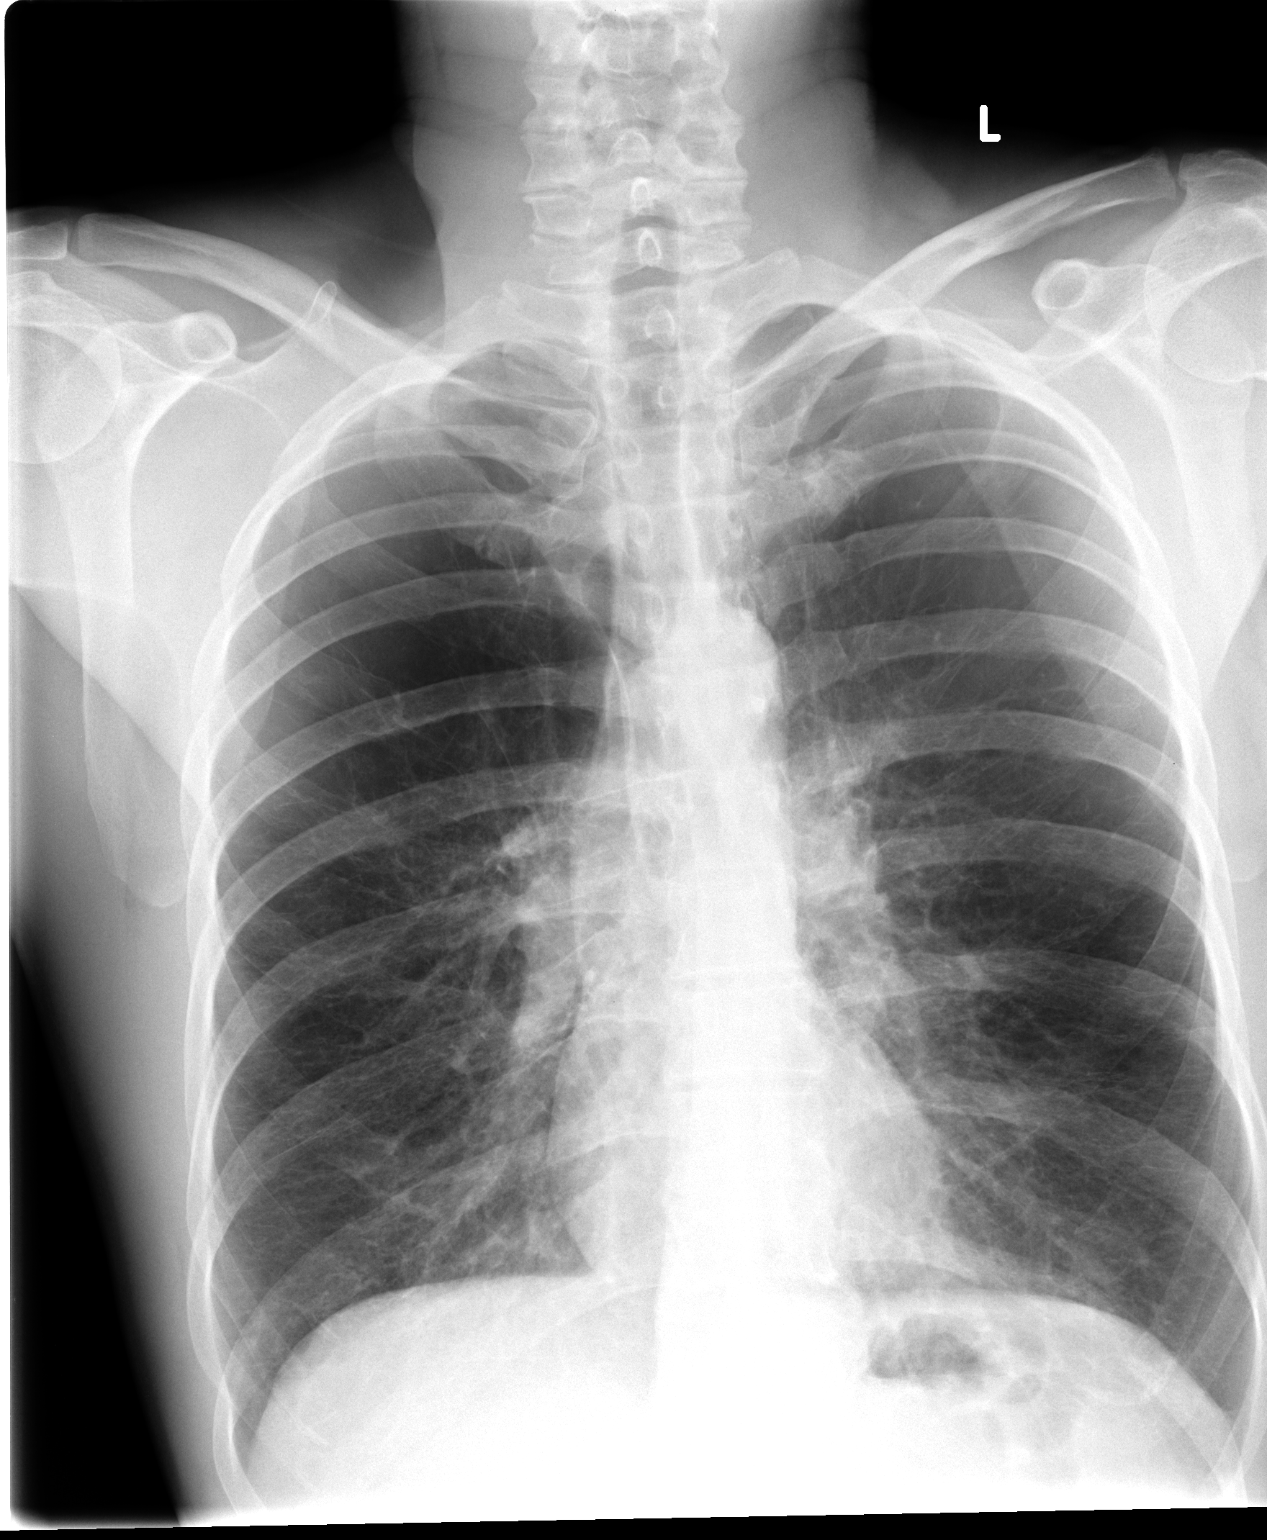

[view not recorded (2 of 2)]
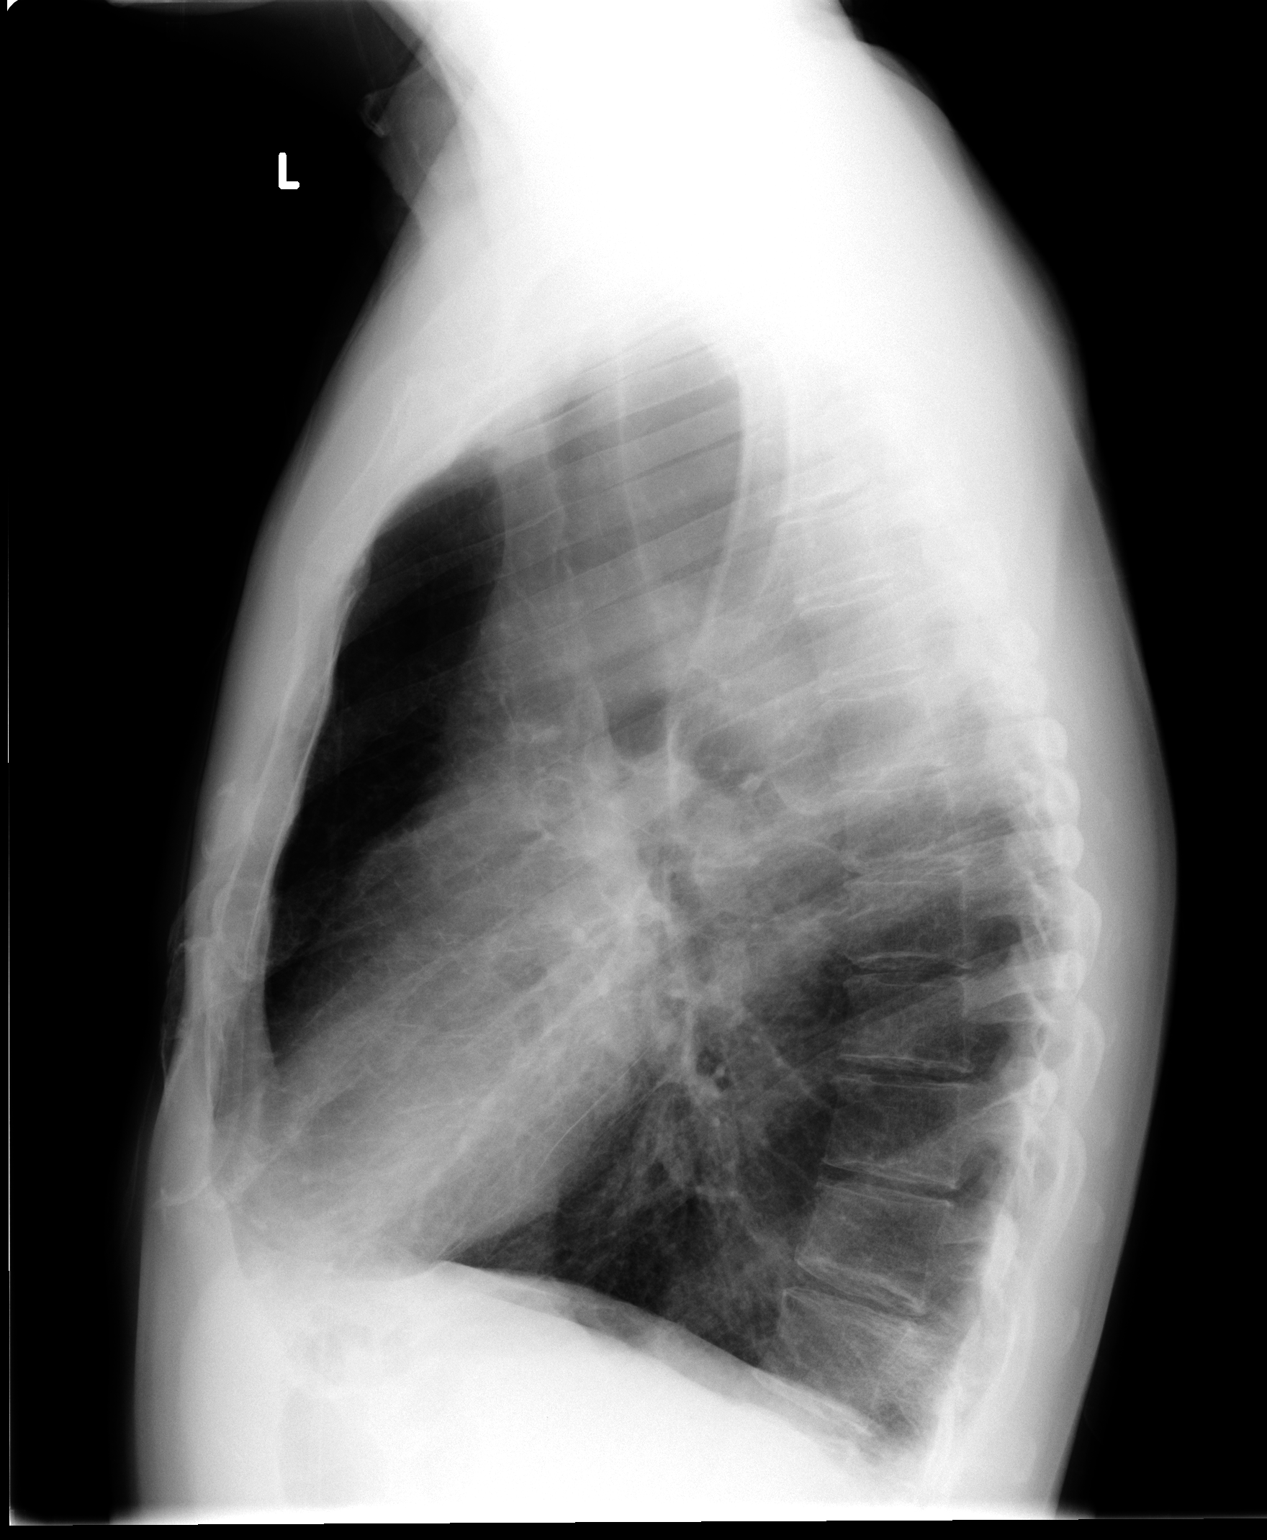

[2 of 2 positions shown; findings below may reference images not displayed]

FINDINGS: Cardiomediastinal silhouette is unremarkable.
Hyperinflation is seen.  No acute infiltrate or pleural effusion.
No edema.  Bilateral central increased bronchial markings noted.
Bronchitic changes or peribronchial inflammation suspected.
IMPRESSION: No acute infiltrate.  Bilateral central increased bronchial
markings.  Bronchitic changes or peribronchial inflammation
suspected.

## 2009-06-04 ENCOUNTER — Emergency Department (HOSPITAL_COMMUNITY): Admission: EM | Admit: 2009-06-04 | Discharge: 2009-06-04 | Payer: Self-pay | Admitting: Family Medicine

## 2009-06-27 IMAGING — CR DG WRIST COMPLETE 3+V*L*
4 series · 4 of 4 positions shown · non-contrast
Comparison: None

CLINICAL DATA: Laceration

LEFT WRIST - COMPLETE 3+ VIEW

[view not recorded (1 of 4)]
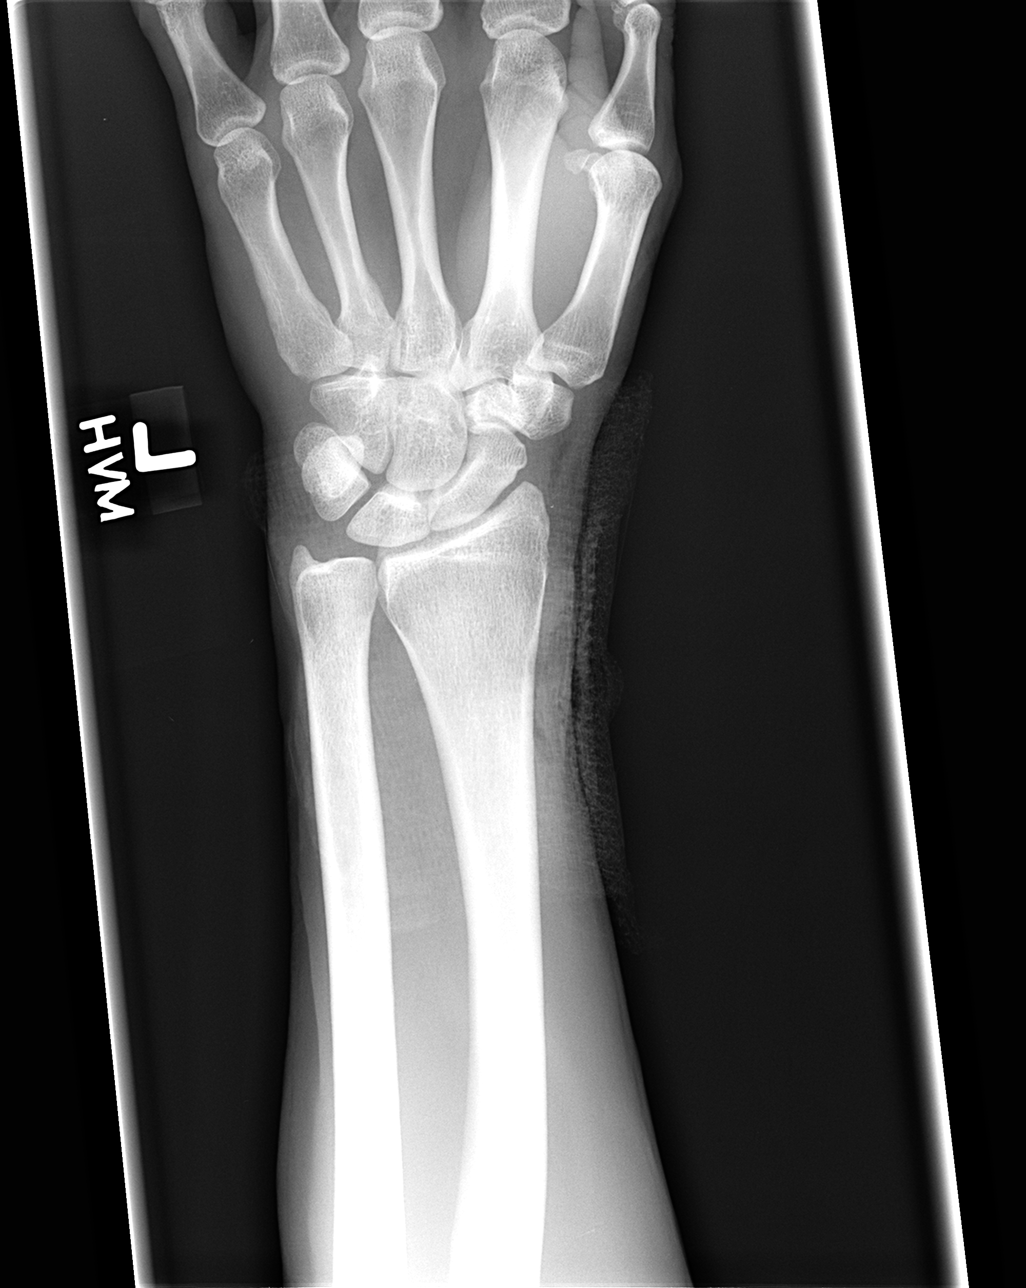

[view not recorded (2 of 4)]
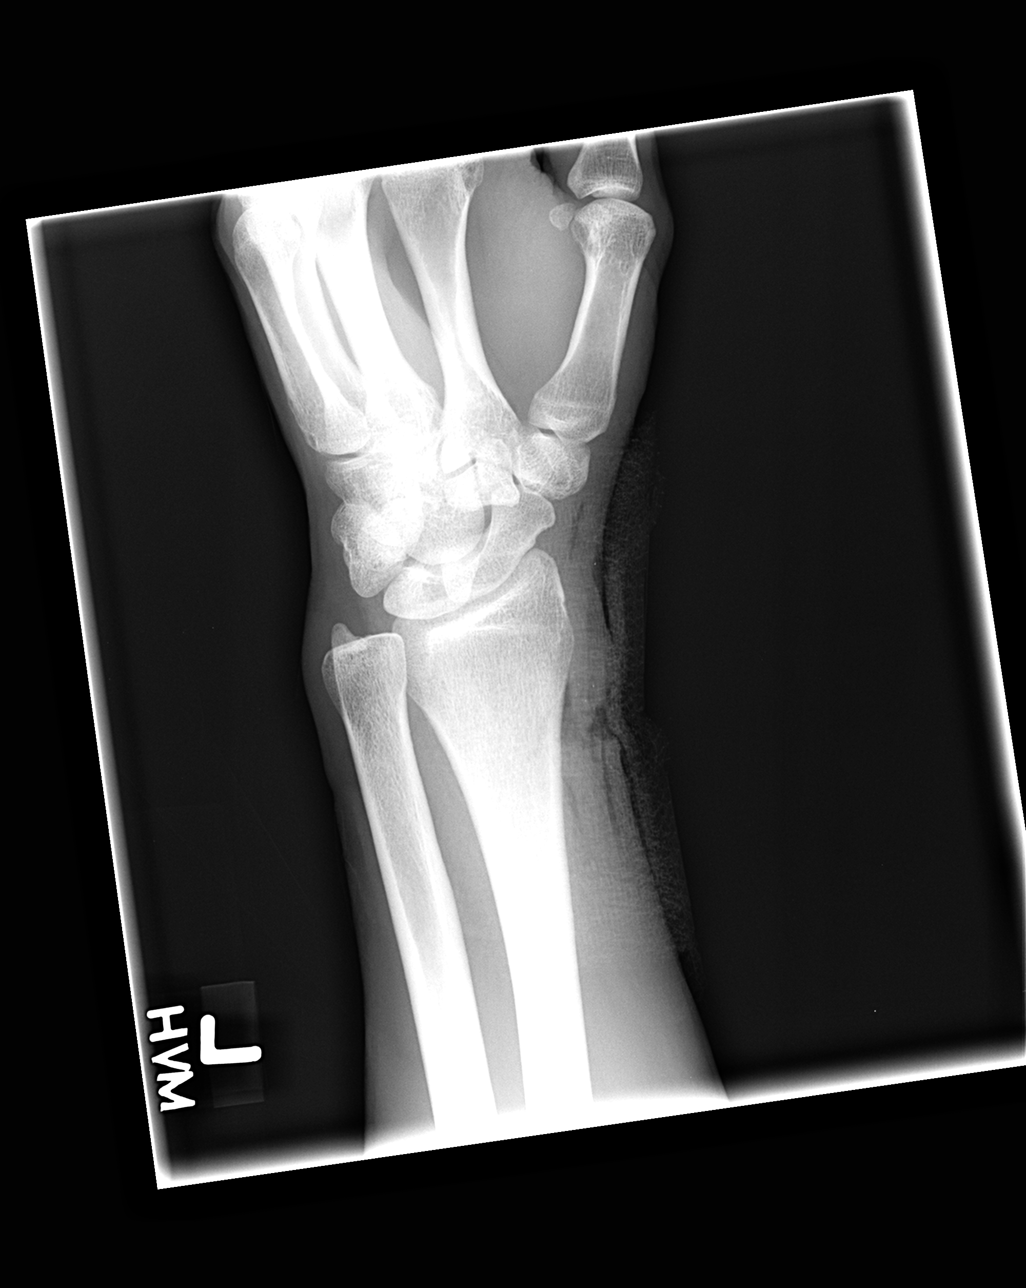

[view not recorded (3 of 4)]
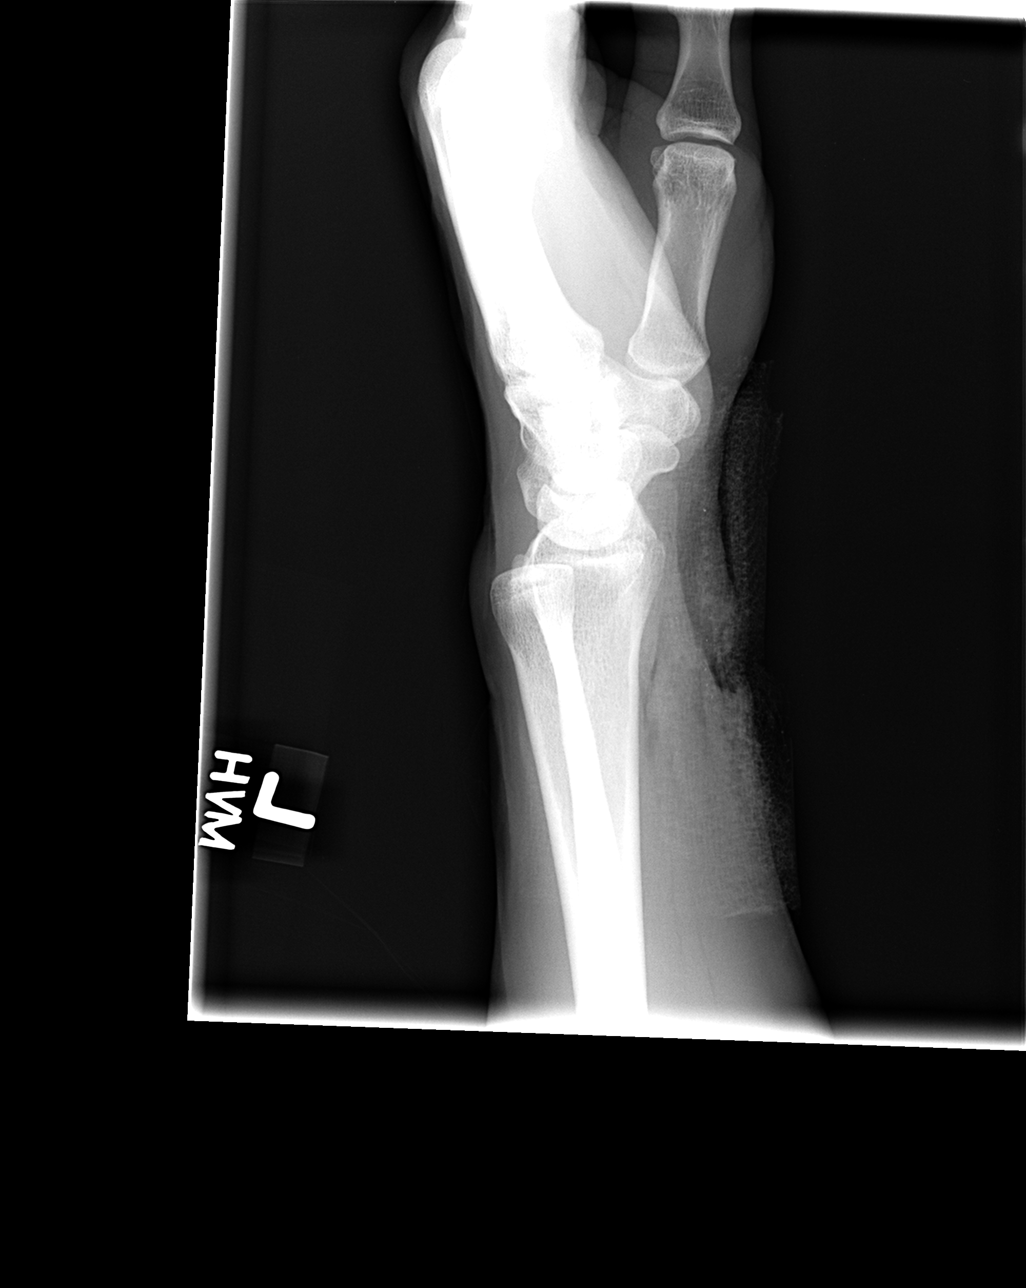

[view not recorded (4 of 4)]
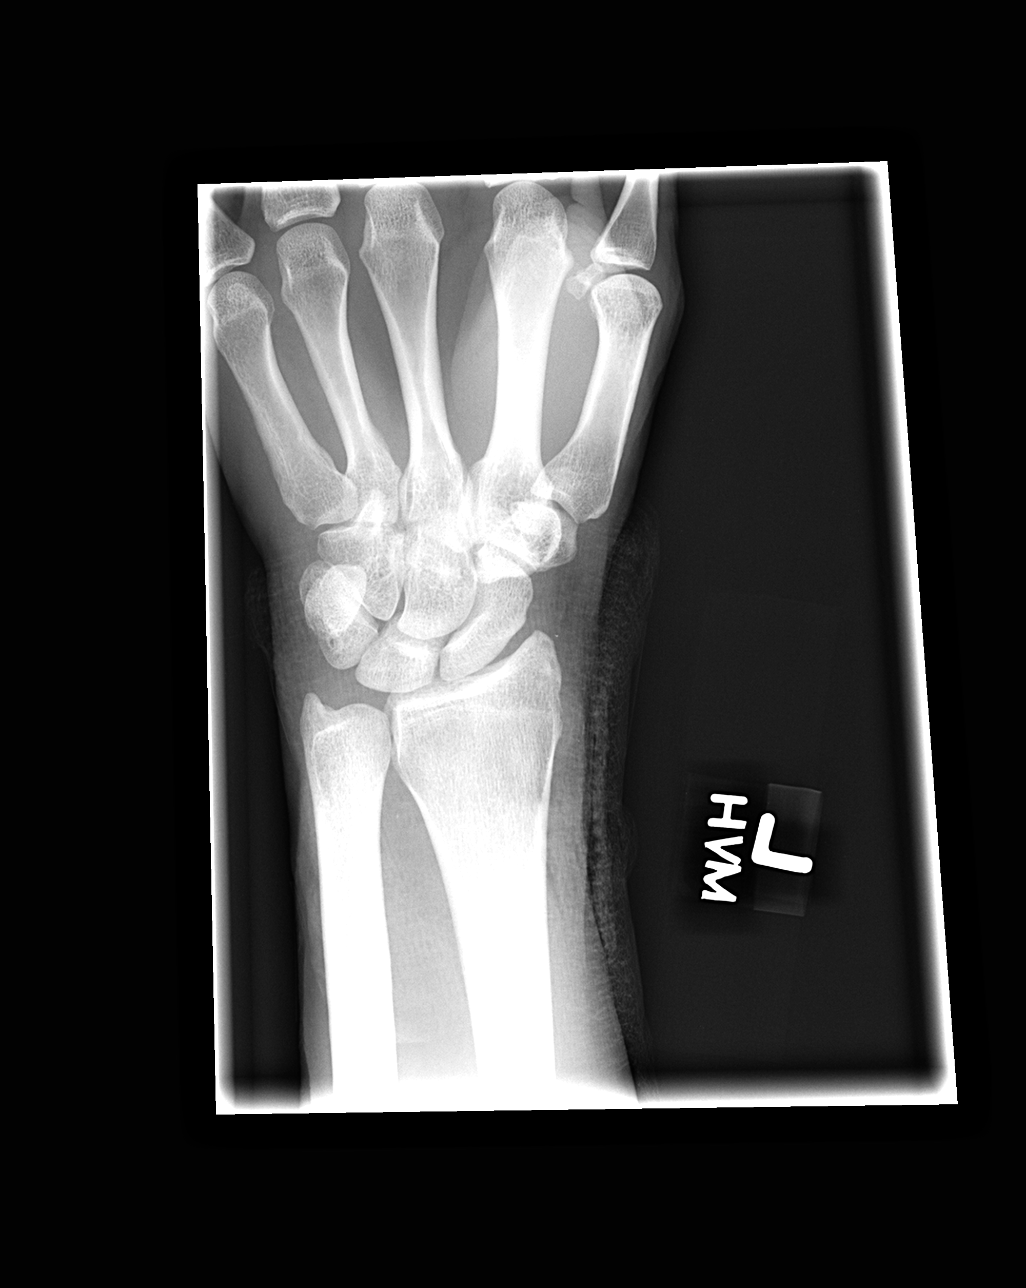

[4 of 4 positions shown; findings below may reference images not displayed]

FINDINGS: Extensive laceration of the antero-lateral aspect of the
distal forearm.  No underlying fracture.
IMPRESSION: No acute osseous findings.

## 2009-07-06 ENCOUNTER — Emergency Department (HOSPITAL_COMMUNITY): Admission: EM | Admit: 2009-07-06 | Discharge: 2009-07-06 | Payer: Self-pay | Admitting: Emergency Medicine

## 2009-07-14 HISTORY — PX: ESOPHAGOGASTRODUODENOSCOPY: SHX1529

## 2010-01-14 ENCOUNTER — Emergency Department (HOSPITAL_COMMUNITY): Admission: EM | Admit: 2010-01-14 | Discharge: 2010-01-15 | Payer: Self-pay | Admitting: Emergency Medicine

## 2010-02-22 IMAGING — CR DG CHEST 1V PORT
1 series · 1 of 1 positions shown · non-contrast
Comparison: Portable exam 9194 hours compared to 03/04/2008

CLINICAL DATA: Difficulty breathing, history COPD, smoking

PORTABLE CHEST - 1 VIEW

[view not recorded]
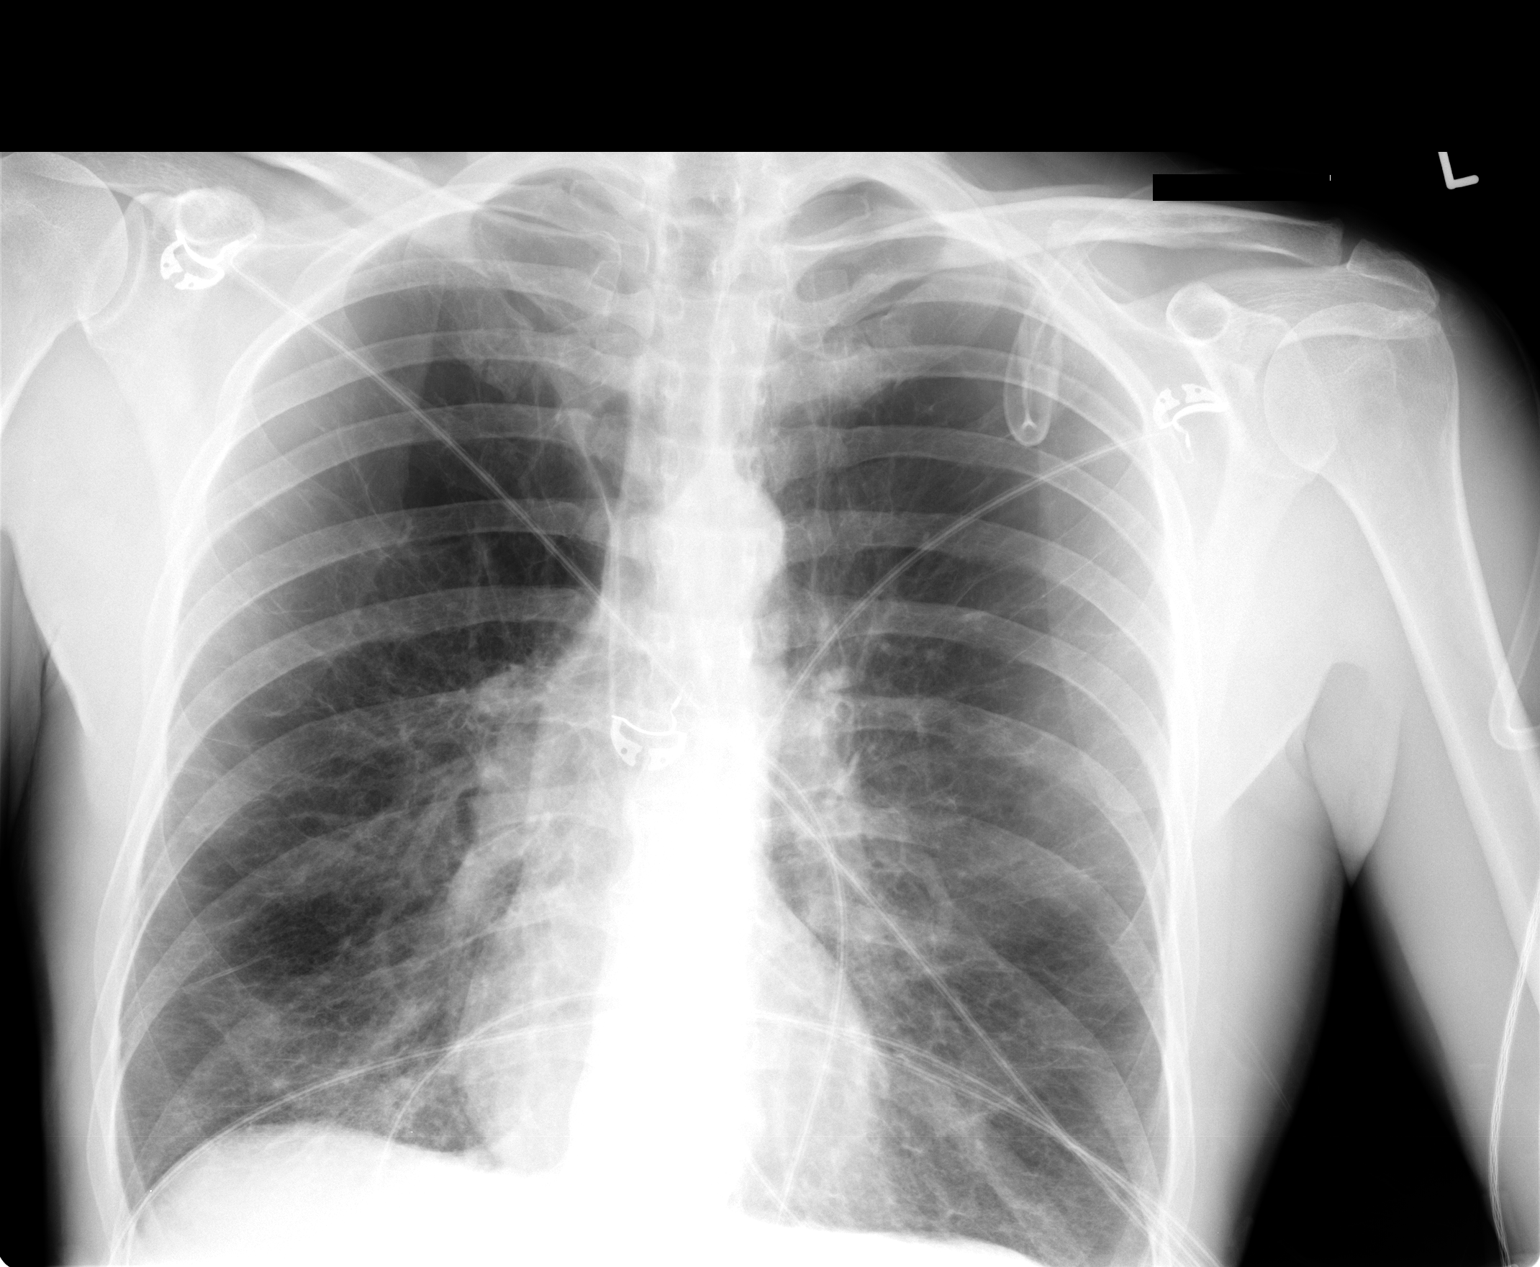

[1 of 1 positions shown; findings below may reference images not displayed]

FINDINGS: Normal heart size, mediastinal contours, and pulmonary vascularity.
Severe emphysematous changes compatible with COPD.
Minimal chronic peribronchial thickening.
Slightly increased right basilar markings versus previous study
cannot exclude early infiltrate.
Remaining lungs clear.
Cardiac monitoring lines and O2 tubing project over chest.
IMPRESSION: COPD with questionable early right basilar infiltrate.

## 2010-02-23 IMAGING — CR DG CHEST 2V
2 series · 2 of 2 positions shown · non-contrast
Comparison: 12/05/2008 and 03/04/2008.

CLINICAL DATA: Chest pain, pneumonia, difficulty breathing.
Hypotension.

CHEST - 2 VIEW

[view not recorded (1 of 2)]
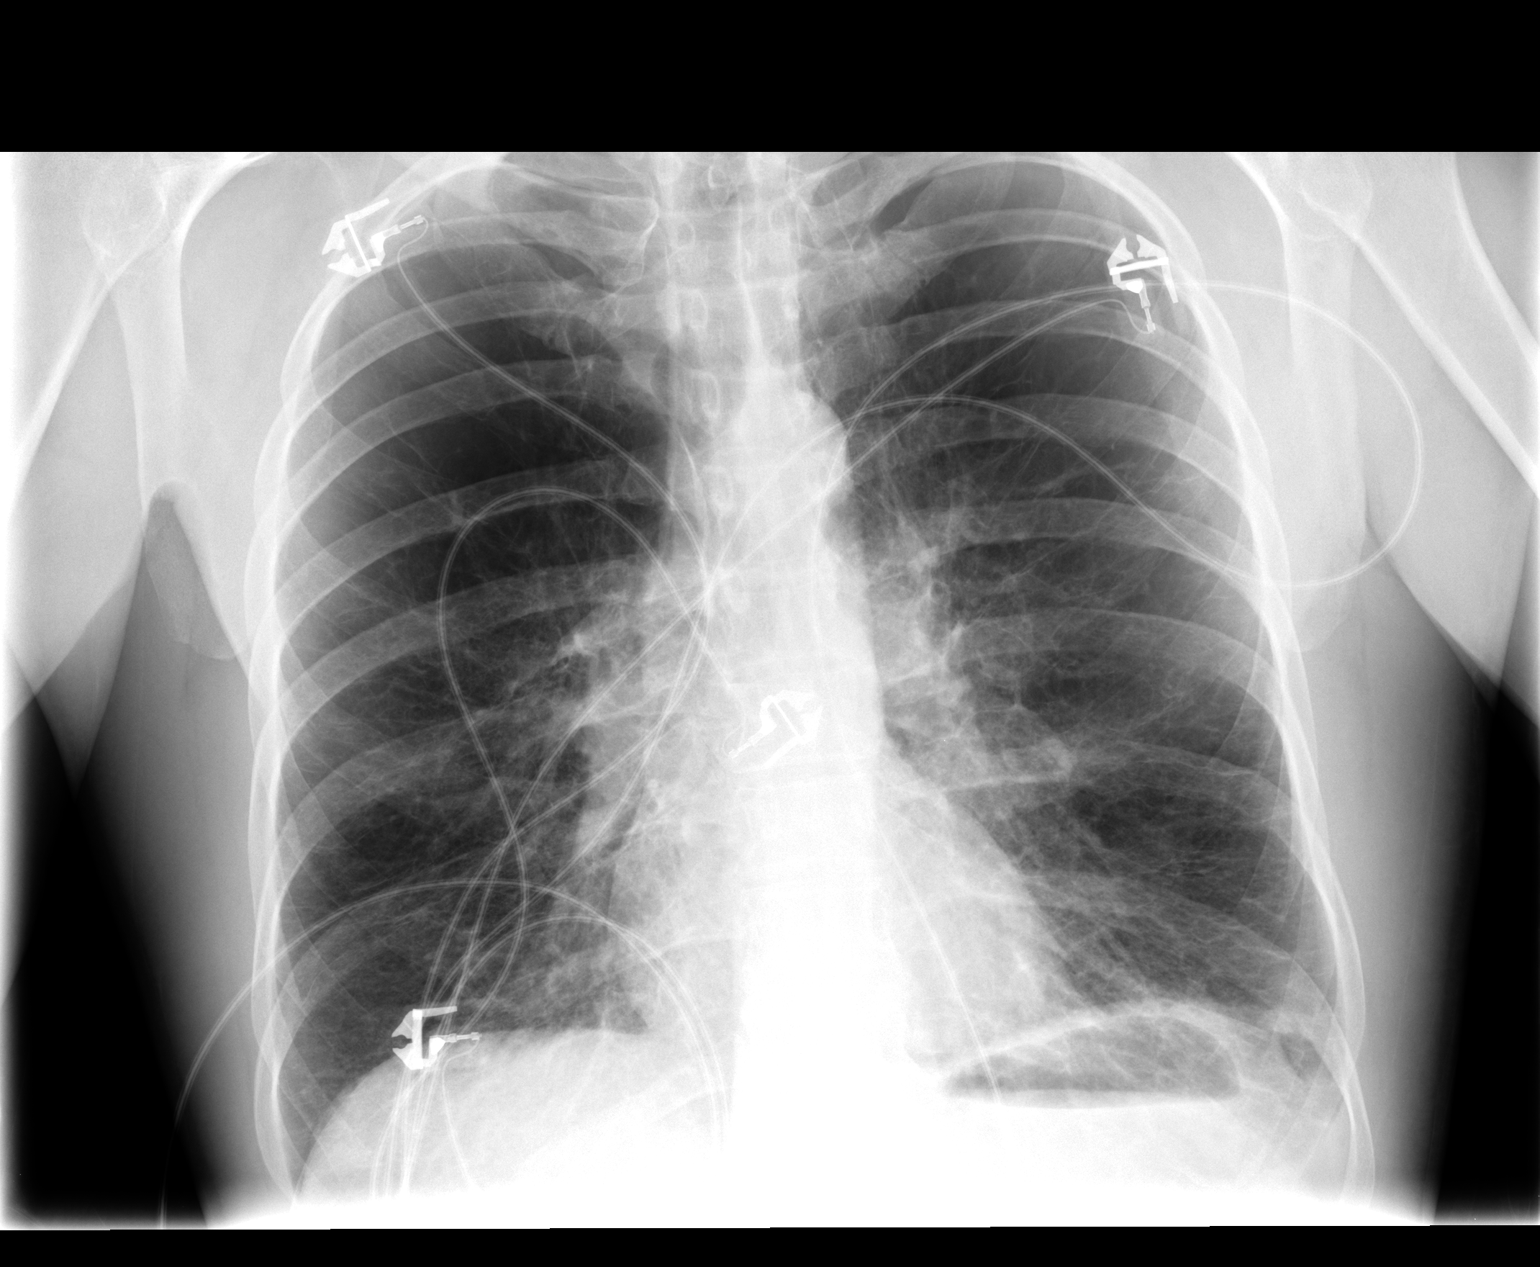

[view not recorded (2 of 2)]
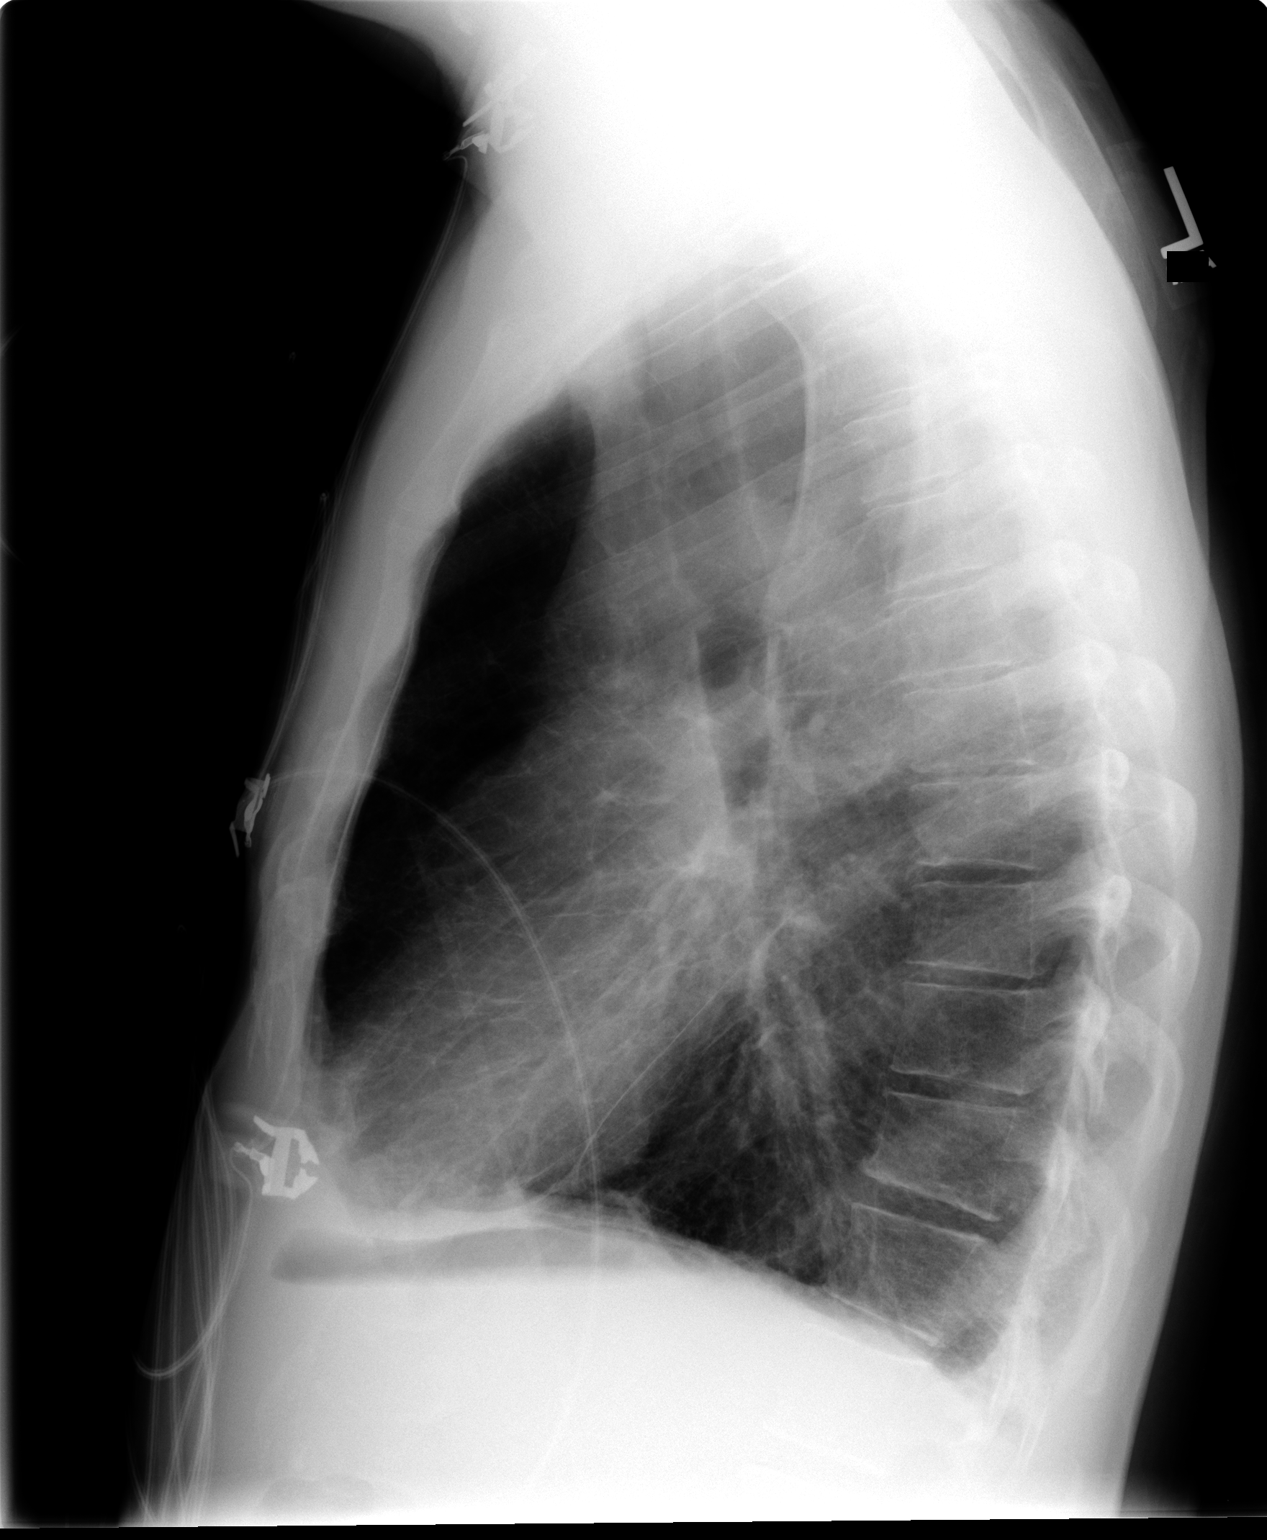

[2 of 2 positions shown; findings below may reference images not displayed]

FINDINGS: There are changes of COPD present.  There is bibasilar
interstitial accentuation noted which is unchanged when compared
with the prior study.   There are no definite focal infiltrates
seen on today's study. The heart is normal in size.  The central
pulmonary arteries are prominent but unchanged.
IMPRESSION: Changes of COPD with bibasilar interstitial accentuation -
unchanged.  No definite acute infiltrate.

## 2010-03-13 IMAGING — CR DG CHEST 1V PORT
1 series · 1 of 1 positions shown · non-contrast
Comparison: 12/06/2008

CLINICAL DATA: Chest pain, shortness of breath

PORTABLE CHEST - 1 VIEW

[view not recorded]
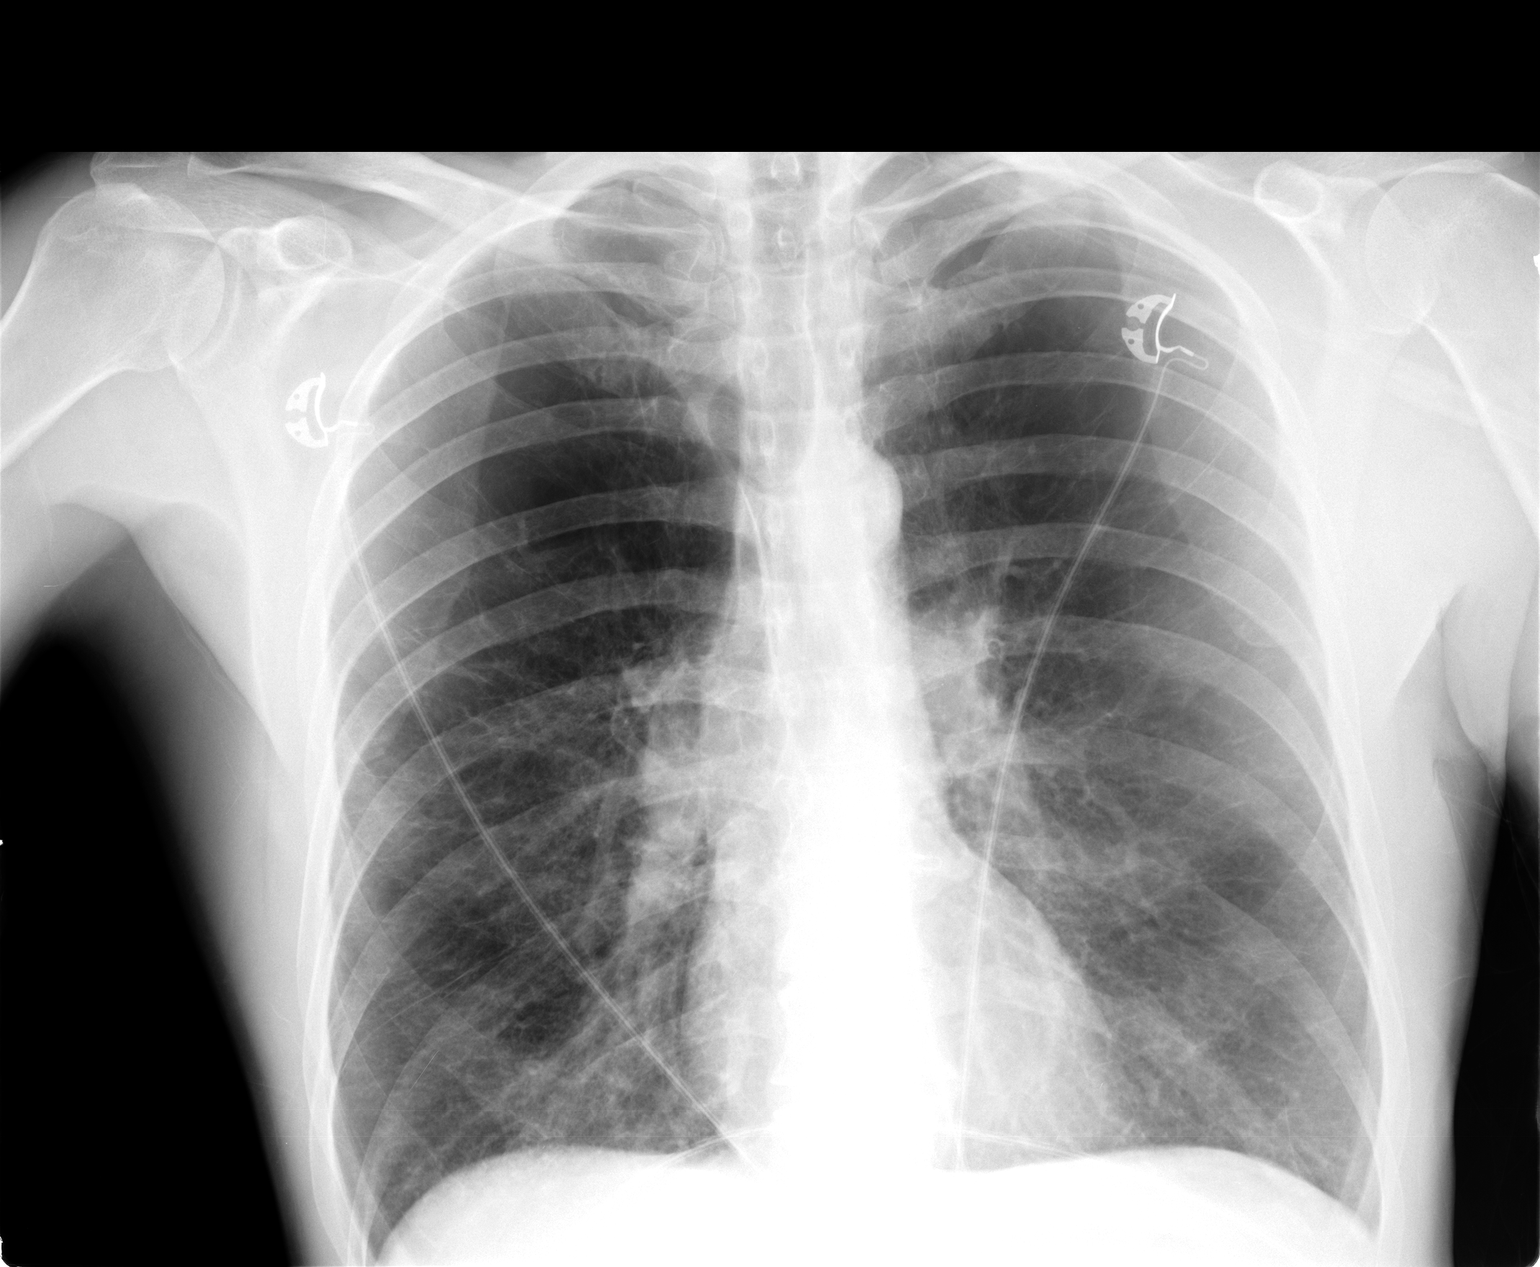

[1 of 1 positions shown; findings below may reference images not displayed]

FINDINGS: Hyperinflation with coarse interstitial markings,
attenuated in   the lung apices.  No confluent airspace infiltrate
or overt edema.  No effusion.  Heart size normal.  Visualized bones
unremarkable.
IMPRESSION: Hyperinflation and chronic changes without acute or superimposed
abnormality.

## 2010-03-31 ENCOUNTER — Encounter (INDEPENDENT_AMBULATORY_CARE_PROVIDER_SITE_OTHER): Payer: Self-pay | Admitting: *Deleted

## 2010-03-31 ENCOUNTER — Emergency Department (HOSPITAL_COMMUNITY): Admission: EM | Admit: 2010-03-31 | Discharge: 2010-03-31 | Payer: Self-pay | Admitting: Emergency Medicine

## 2010-03-31 LAB — CONVERTED CEMR LAB
Basophils Relative: 1 %
CK-MB: <1 ng/mL
Calcium: 8.4 mg/dL
Chloride: 102 meq/L
Eosinophils Absolute: 0.3 10*3/uL
Glomerular Filtration Rate, Af Am: >60 mL/min/{1.73_m2}
HCT: 46.9 %
Hemoglobin: 16.2 g/dL
MCHC: 34.5 g/dL
Monocytes Relative: 8 %
Platelets: 146 10*3/uL
Potassium: 3.7 meq/L
RDW: 13.1 %
Sodium: 134 meq/L
Troponin I: <0.05 ng/mL

## 2010-05-16 ENCOUNTER — Encounter (INDEPENDENT_AMBULATORY_CARE_PROVIDER_SITE_OTHER): Payer: Self-pay | Admitting: *Deleted

## 2010-05-17 ENCOUNTER — Ambulatory Visit: Payer: Self-pay | Admitting: Cardiology

## 2010-05-17 DIAGNOSIS — F17200 Nicotine dependence, unspecified, uncomplicated: Secondary | ICD-10-CM

## 2010-05-20 ENCOUNTER — Telehealth (INDEPENDENT_AMBULATORY_CARE_PROVIDER_SITE_OTHER): Payer: Self-pay | Admitting: *Deleted

## 2010-07-14 HISTORY — PX: COLONOSCOPY: SHX174

## 2010-07-24 IMAGING — CR DG HAND COMPLETE 3+V*R*
3 series · 3 of 3 positions shown · non-contrast
Comparison: None

CLINICAL DATA: Laceration

RIGHT HAND - COMPLETE 3+ VIEW

[view not recorded (1 of 3)]
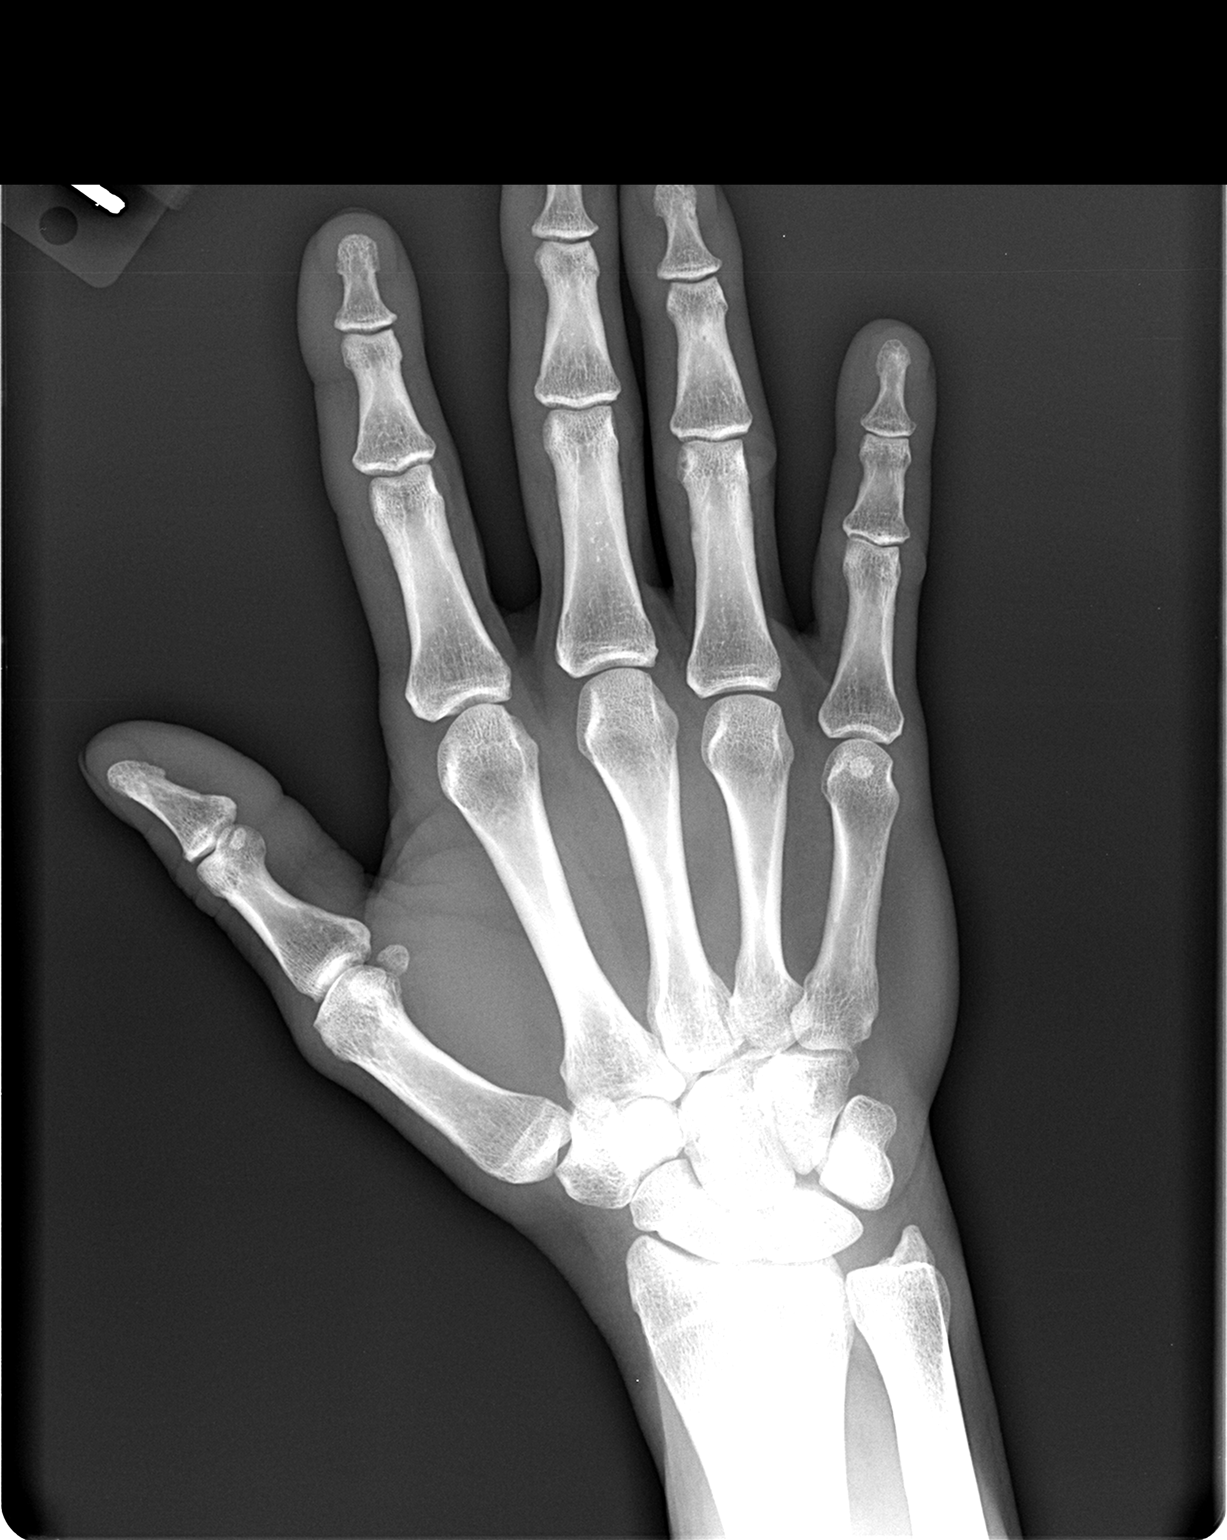

[view not recorded (2 of 3)]
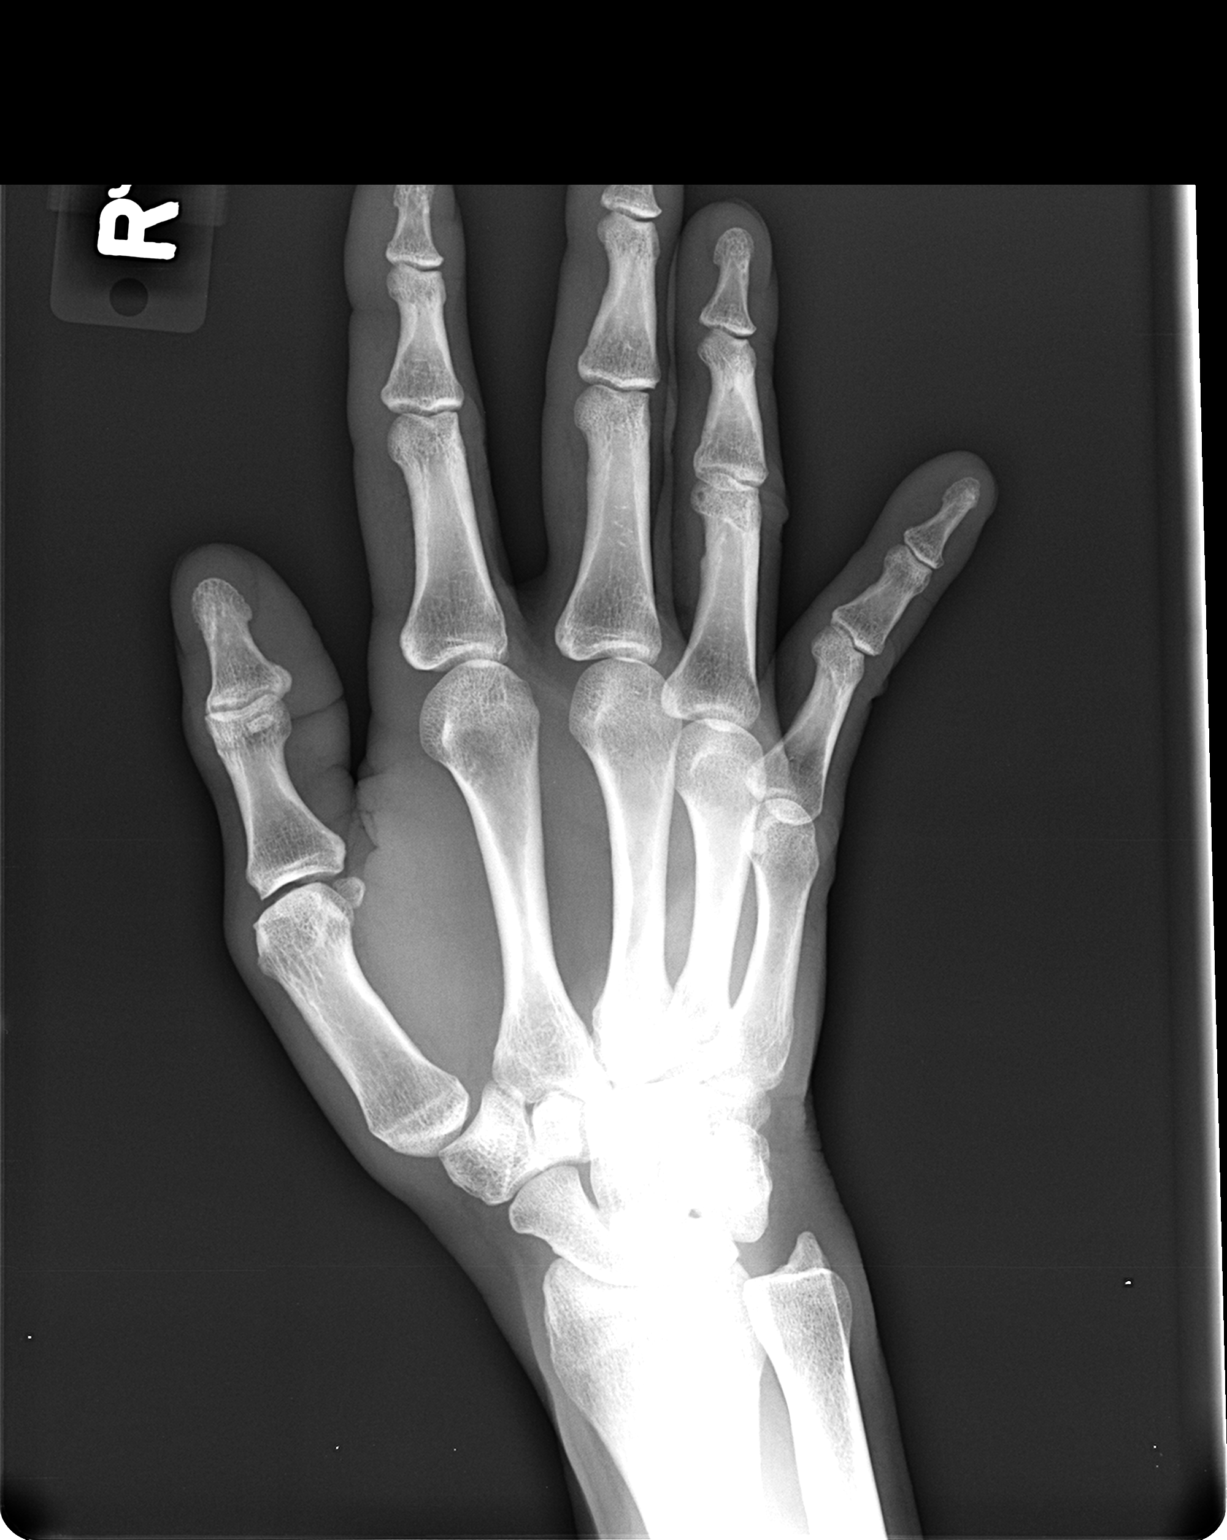

[view not recorded (3 of 3)]
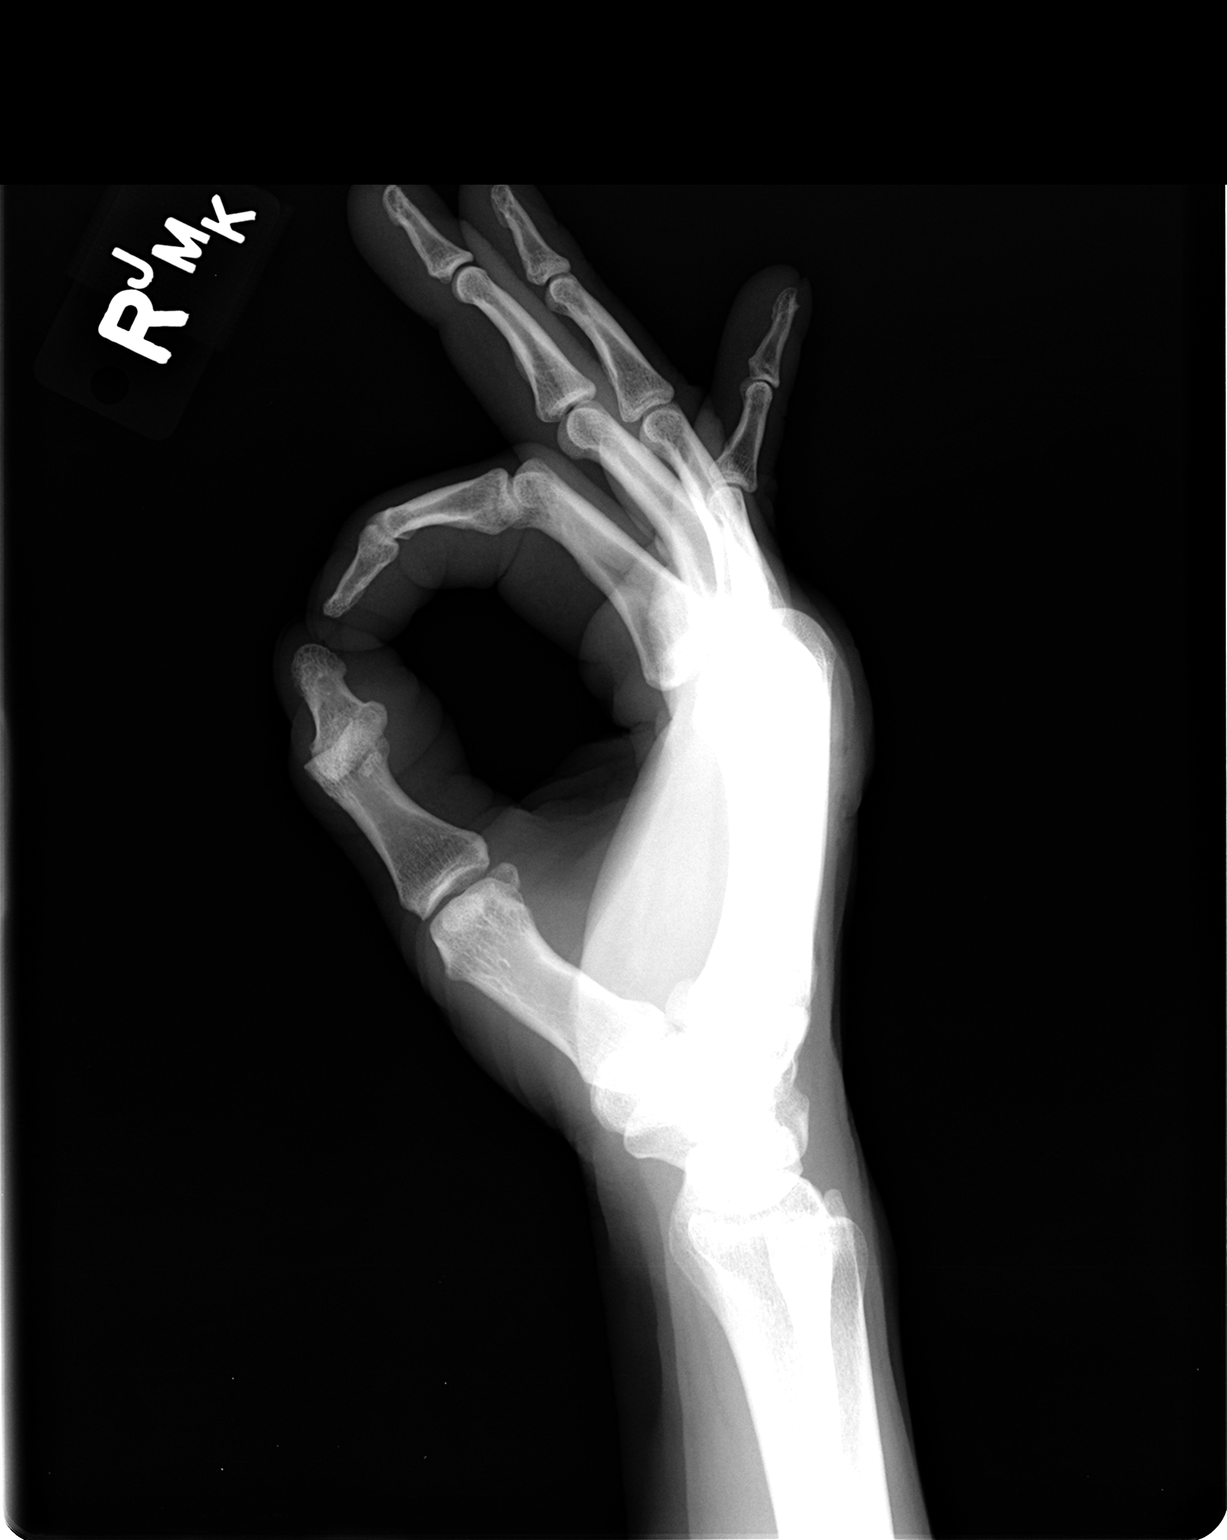

[3 of 3 positions shown; findings below may reference images not displayed]

FINDINGS: Soft tissue defect dorsal to the metacarpals.  No
radiodense foreign body. Negative for fracture, dislocation, or
other acute abnormality.  Normal alignment and mineralization. No
significant degenerative change.
IMPRESSION: Negative for fracture or foreign body.

## 2010-08-11 LAB — CONVERTED CEMR LAB
Cholesterol: 147 mg/dL
Hgb A1c MFr Bld: 6 %
LDL (calc): 101 mg/dL
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
## 2010-08-13 NOTE — Progress Notes (Signed)
Summary: PT NEEDS PCP DOC   Phone Note Call from Patient Call back at Home Phone [phone]   Caller: PT Reason for Call: Talk to Nurse Summary of Call: PT WAS SEEN LAST WEEK AND TOLD THAT WE WOULD BE WORKING ON GETTING HIM SET UP WITH A PRIMARY CARE DOCTOR IN [place]. Initial call taken by: Faythe Ghee,  May 20, 2010 11:24 AM  Follow-up for Phone Call        Spoke with [facility] family practice, pt to call office to set up new pt appt Follow-up by: Teressa Lower RN,  May 20, 2010 11:49 AM

## 2010-08-13 NOTE — Miscellaneous (Signed)
Summary: hospital labs 03/31/2010  Clinical Lists Changes  Observations: Added new observation of TROPONIN I: <0.05 (03/31/2010 16:19) Added new observation of CK-MB ISOENZ: <1.0 (03/31/2010 16:19) Added new observation of CALCIUM: 8.4 mg/dL (60/45/4098 11:91) Added new observation of GFR AA: >60 mL/min/1.78m2 (03/31/2010 16:19) Added new observation of GFR: >60 mL/min (03/31/2010 16:19) Added new observation of CREATININE: 0.98 mg/dL (47/82/9562 13:08) Added new observation of BUN: 14 mg/dL (65/78/4696 29:52) Added new observation of BG RANDOM: 138 mg/dL (84/13/2440 10:27) Added new observation of CO2 PLSM/SER: 23 meq/L (03/31/2010 16:19) Added new observation of CL SERUM: 102 meq/L (03/31/2010 16:19) Added new observation of K SERUM: 3.7 meq/L (03/31/2010 16:19) Added new observation of NA: 134 meq/L (03/31/2010 16:19) Added new observation of ABSOLUTE BAS: 0.1 K/uL (03/31/2010 16:19) Added new observation of BASOPHIL %: 1 % (03/31/2010 16:19) Added new observation of EOS ABSLT: 0.3 K/uL (03/31/2010 16:19) Added new observation of % EOS AUTO: 3 % (03/31/2010 16:19) Added new observation of ABSOLUTE MON: 2.7 K/uL (03/31/2010 16:19) Added new observation of MONOCYTE %: 8 % (03/31/2010 16:19) Added new observation of LYMPHS %: 34 % (03/31/2010 16:19) Added new observation of ABS NEUTROPH: 4.3 K/uL (03/31/2010 16:19) Added new observation of PLATELETK/UL: 146 K/uL (03/31/2010 16:19) Added new observation of RDW: 13.1 % (03/31/2010 16:19) Added new observation of MCHC RBC: 34.5 g/dL (25/36/6440 34:74) Added new observation of MCV: 88.7 fL (03/31/2010 16:19) Added new observation of HCT: 46.9 % (03/31/2010 16:19) Added new observation of HGB: 16.2 g/dL (25/95/6387 56:43) Added new observation of RBC M/UL: 5.29 M/uL (03/31/2010 16:19) Added new observation of WBC COUNT: 8.0 10*3/microliter (03/31/2010 16:19)

## 2010-09-23 ENCOUNTER — Ambulatory Visit (INDEPENDENT_AMBULATORY_CARE_PROVIDER_SITE_OTHER): Payer: Medicaid Other | Admitting: Cardiology

## 2010-09-23 ENCOUNTER — Encounter: Payer: Self-pay | Admitting: Cardiology

## 2010-09-23 DIAGNOSIS — I2510 Atherosclerotic heart disease of native coronary artery without angina pectoris: Secondary | ICD-10-CM

## 2010-09-23 DIAGNOSIS — E782 Mixed hyperlipidemia: Secondary | ICD-10-CM

## 2010-09-23 DIAGNOSIS — R0989 Other specified symptoms and signs involving the circulatory and respiratory systems: Secondary | ICD-10-CM

## 2010-09-23 LAB — CONVERTED CEMR LAB
LDL Cholesterol: 121 mg/dL — ABNORMAL HIGH (ref 0–99)
Triglycerides: 152 mg/dL — ABNORMAL HIGH (ref <150)
VLDL: 30 mg/dL (ref 0–40)

## 2010-09-23 IMAGING — CR DG CHEST 1V PORT
1 series · 1 of 1 positions shown · non-contrast
Comparison: Portable chest x-ray 12/24/2008 and two-view chest x-
ray 12/06/2008 and 03/04/2008.

CLINICAL DATA: Chest pain.

PORTABLE CHEST - 1 VIEW [DATE]/6767 6389 hours:

[view not recorded]
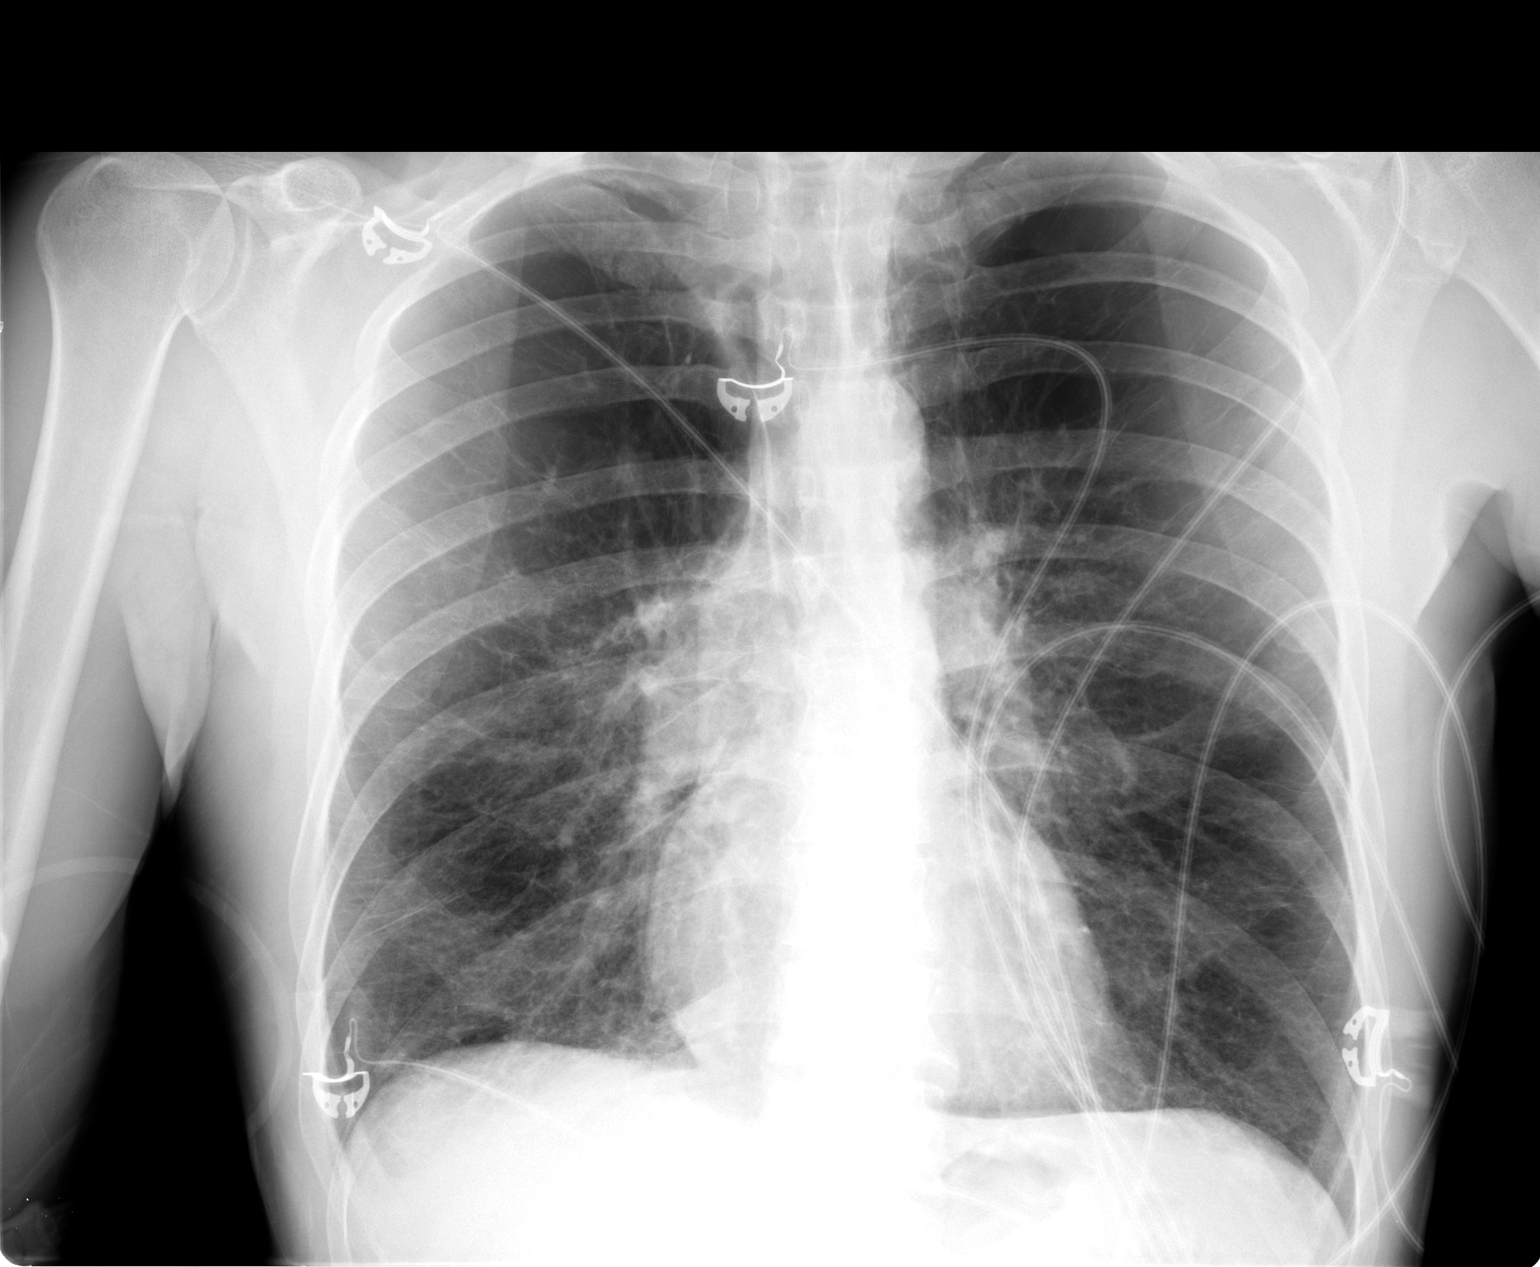

[1 of 1 positions shown; findings below may reference images not displayed]

FINDINGS: Severe emphysematous changes throughout both lungs,
particularly the upper lobes, unchanged.  Moderate central
peribronchial thickening, unchanged, consistent with chronic
bronchitis.  Lungs otherwise clear.  No pleural effusions.
Cardiomediastinal silhouette unremarkable and unchanged.
IMPRESSION: Severe COPD/emphysema.  No acute cardiopulmonary disease.

## 2010-09-26 ENCOUNTER — Emergency Department (HOSPITAL_COMMUNITY)
Admission: EM | Admit: 2010-09-26 | Discharge: 2010-09-27 | Disposition: A | Discharge status: Home / Self Care | Payer: Medicare Other | Attending: Emergency Medicine | Admitting: Emergency Medicine

## 2010-09-26 ENCOUNTER — Emergency Department (HOSPITAL_COMMUNITY): Payer: Medicare Other

## 2010-09-26 DIAGNOSIS — J209 Acute bronchitis, unspecified: Secondary | ICD-10-CM | POA: Insufficient documentation

## 2010-09-26 DIAGNOSIS — R05 Cough: Secondary | ICD-10-CM | POA: Insufficient documentation

## 2010-09-26 DIAGNOSIS — R059 Cough, unspecified: Secondary | ICD-10-CM | POA: Insufficient documentation

## 2010-09-26 DIAGNOSIS — R0602 Shortness of breath: Secondary | ICD-10-CM | POA: Insufficient documentation

## 2010-09-26 DIAGNOSIS — R071 Chest pain on breathing: Secondary | ICD-10-CM | POA: Insufficient documentation

## 2010-09-26 LAB — BASIC METABOLIC PANEL
BUN: 14 mg/dL (ref 6–23)
CO2: 23 mEq/L (ref 19–32)
Chloride: 102 mEq/L (ref 96–112)
Creatinine, Ser: 0.98 mg/dL (ref 0.4–1.5)
Glucose, Bld: 138 mg/dL — ABNORMAL HIGH (ref 70–99)
Potassium: 3.7 mEq/L (ref 3.5–5.1)

## 2010-09-26 LAB — DIFFERENTIAL
Basophils Absolute: 0.1 10*3/uL (ref 0.0–0.1)
Basophils Relative: 1 % (ref 0–1)
Eosinophils Absolute: 0.3 10*3/uL (ref 0.0–0.7)
Neutro Abs: 4.3 10*3/uL (ref 1.7–7.7)
Neutrophils Relative %: 54 % (ref 43–77)

## 2010-09-26 LAB — CBC
HCT: 46.9 % (ref 39.0–52.0)
MCH: 30.6 pg (ref 26.0–34.0)
MCV: 88.7 fL (ref 78.0–100.0)
RDW: 13.1 % (ref 11.5–15.5)
WBC: 8 10*3/uL (ref 4.0–10.5)

## 2010-09-26 LAB — POCT CARDIAC MARKERS
CKMB, poc: <1 ng/mL — ABNORMAL LOW (ref 1.0–8.0)
Myoglobin, poc: 58.3 ng/mL (ref 12–200)

## 2010-09-29 LAB — BASIC METABOLIC PANEL
BUN: 15 mg/dL (ref 6–23)
CO2: 22 mEq/L (ref 19–32)
Calcium: 8.9 mg/dL (ref 8.4–10.5)
Creatinine, Ser: 1.05 mg/dL (ref 0.4–1.5)
GFR calc non Af Amer: >60 mL/min (ref >60)
Glucose, Bld: 110 mg/dL — ABNORMAL HIGH (ref 70–99)

## 2010-09-29 LAB — DIFFERENTIAL
Basophils Absolute: 0.1 10*3/uL (ref 0.0–0.1)
Basophils Relative: 1 % (ref 0–1)
Eosinophils Absolute: 0.2 10*3/uL (ref 0.0–0.7)
Monocytes Relative: 9 % (ref 3–12)
Neutro Abs: 4.3 10*3/uL (ref 1.7–7.7)
Neutrophils Relative %: 51 % (ref 43–77)

## 2010-09-29 LAB — CBC
Hemoglobin: 16.7 g/dL (ref 13.0–17.0)
MCH: 30.7 pg (ref 26.0–34.0)
MCHC: 34.2 g/dL (ref 30.0–36.0)
Platelets: 176 10*3/uL (ref 150–400)
RDW: 12.8 % (ref 11.5–15.5)

## 2010-09-29 LAB — POCT CARDIAC MARKERS: Myoglobin, poc: 66 ng/mL (ref 12–200)
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
## 2010-10-10 ENCOUNTER — Telehealth: Payer: Self-pay | Admitting: Cardiology

## 2010-10-10 NOTE — Telephone Encounter (Signed)
Pt had moved his bedroom around and misplaced his pravastatin, he found it yesterday and restarted yesterday.

## 2010-10-10 NOTE — Telephone Encounter (Signed)
Patient has questions regarding labwork/cholesterol / states that his PCP done blood work and his Cholesterol was high but said Dr.Rothbart done bloodwork too but did not mention it being high / pls return call/tg

## 2010-10-14 LAB — POCT CARDIAC MARKERS
CKMB, poc: 1.2 ng/mL (ref 1.0–8.0)
Myoglobin, poc: 91 ng/mL (ref 12–200)
Troponin i, poc: <0.05 ng/mL (ref 0.00–0.09)

## 2010-10-14 LAB — CBC
Platelets: 166 10*3/uL (ref 150–400)
RBC: 5.38 MIL/uL (ref 4.22–5.81)
WBC: 5.9 10*3/uL (ref 4.0–10.5)

## 2010-10-14 LAB — DIFFERENTIAL
Lymphocytes Relative: 31 % (ref 12–46)
Lymphs Abs: 1.9 10*3/uL (ref 0.7–4.0)
Neutrophils Relative %: 61 % (ref 43–77)

## 2010-10-14 LAB — BASIC METABOLIC PANEL
BUN: 11 mg/dL (ref 6–23)
Creatinine, Ser: 0.96 mg/dL (ref 0.4–1.5)
GFR calc Af Amer: >60 mL/min (ref >60)
GFR calc non Af Amer: >60 mL/min (ref >60)
Potassium: 4.3 mEq/L (ref 3.5–5.1)

## 2010-10-21 LAB — CBC
MCHC: 34.8 g/dL (ref 30.0–36.0)
MCV: 89.6 fL (ref 78.0–100.0)
Platelets: 175 10*3/uL (ref 150–400)
RBC: 5.45 MIL/uL (ref 4.22–5.81)

## 2010-10-21 LAB — BASIC METABOLIC PANEL
BUN: 14 mg/dL (ref 6–23)
CO2: 26 mEq/L (ref 19–32)
Chloride: 104 mEq/L (ref 96–112)
Creatinine, Ser: 1 mg/dL (ref 0.4–1.5)
Glucose, Bld: 104 mg/dL — ABNORMAL HIGH (ref 70–99)

## 2010-10-21 LAB — DIFFERENTIAL
Basophils Relative: 1 % (ref 0–1)
Eosinophils Absolute: 0.2 10*3/uL (ref 0.0–0.7)
Monocytes Relative: 4 % (ref 3–12)
Neutrophils Relative %: 71 % (ref 43–77)

## 2010-10-21 LAB — POCT CARDIAC MARKERS

## 2010-10-22 LAB — CBC
HCT: 45.9 % (ref 39.0–52.0)
HCT: 49.2 % (ref 39.0–52.0)
Hemoglobin: 15.3 g/dL (ref 13.0–17.0)
Hemoglobin: 17.3 g/dL — ABNORMAL HIGH (ref 13.0–17.0)
MCHC: 35.3 g/dL (ref 30.0–36.0)
MCHC: 35.5 g/dL (ref 30.0–36.0)
MCV: 88.8 fL (ref 78.0–100.0)
MCV: 89.6 fL (ref 78.0–100.0)
Platelets: 184 10*3/uL (ref 150–400)
RBC: 4.79 MIL/uL (ref 4.22–5.81)
RBC: 5.17 MIL/uL (ref 4.22–5.81)
RBC: 5.48 MIL/uL (ref 4.22–5.81)
WBC: 10.6 10*3/uL — ABNORMAL HIGH (ref 4.0–10.5)
WBC: 6.3 10*3/uL (ref 4.0–10.5)

## 2010-10-22 LAB — DIFFERENTIAL
Basophils Relative: 0 % (ref 0–1)
Basophils Relative: 1 % (ref 0–1)
Eosinophils Absolute: 0.1 10*3/uL (ref 0.0–0.7)
Eosinophils Absolute: 0.2 10*3/uL (ref 0.0–0.7)
Eosinophils Relative: 1 % (ref 0–5)
Lymphocytes Relative: 27 % (ref 12–46)
Lymphocytes Relative: 34 % (ref 12–46)
Lymphs Abs: 2.8 10*3/uL (ref 0.7–4.0)
Monocytes Absolute: 0.1 10*3/uL (ref 0.1–1.0)
Monocytes Absolute: 0.5 10*3/uL (ref 0.1–1.0)
Monocytes Relative: 1 % — ABNORMAL LOW (ref 3–12)
Monocytes Relative: 6 % (ref 3–12)
Monocytes Relative: 8 % (ref 3–12)
Neutro Abs: 3.5 10*3/uL (ref 1.7–7.7)
Neutrophils Relative %: 55 % (ref 43–77)
Neutrophils Relative %: 66 % (ref 43–77)
Neutrophils Relative %: 74 % (ref 43–77)

## 2010-10-22 LAB — BASIC METABOLIC PANEL
CO2: 23 mEq/L (ref 19–32)
CO2: 29 mEq/L (ref 19–32)
Calcium: 8.7 mg/dL (ref 8.4–10.5)
Chloride: 107 mEq/L (ref 96–112)
Chloride: 107 mEq/L (ref 96–112)
Creatinine, Ser: 0.94 mg/dL (ref 0.4–1.5)
Creatinine, Ser: 1.21 mg/dL (ref 0.4–1.5)
GFR calc Af Amer: >60 mL/min (ref >60)
GFR calc Af Amer: >60 mL/min (ref >60)
GFR calc Af Amer: >60 mL/min (ref >60)
GFR calc non Af Amer: >60 mL/min (ref >60)
GFR calc non Af Amer: >60 mL/min (ref >60)
Potassium: 4.3 mEq/L (ref 3.5–5.1)
Sodium: 140 mEq/L (ref 135–145)

## 2010-10-22 LAB — LIPID PANEL
Cholesterol: 165 mg/dL (ref 0–200)
LDL Cholesterol: 111 mg/dL — ABNORMAL HIGH (ref 0–99)
Triglycerides: 132 mg/dL (ref <150)
VLDL: 26 mg/dL (ref 0–40)

## 2010-10-22 LAB — CULTURE, RESPIRATORY W GRAM STAIN

## 2010-10-22 LAB — URINE CULTURE
Colony Count: NO GROWTH
Culture: NO GROWTH

## 2010-10-22 LAB — EXPECTORATED SPUTUM ASSESSMENT W GRAM STAIN, RFLX TO RESP C

## 2010-10-22 LAB — CULTURE, BLOOD (ROUTINE X 2): Culture: NO GROWTH

## 2010-10-22 LAB — T3, FREE: T3, Free: 3.5 pg/mL (ref 2.3–4.2)

## 2010-10-22 LAB — HEMOGLOBIN A1C
Hgb A1c MFr Bld: 5.8 % (ref 4.6–6.1)
Mean Plasma Glucose: 120 mg/dL

## 2010-10-22 LAB — T4, FREE: Free T4: 1.11 ng/dL (ref 0.80–1.80)

## 2010-10-22 LAB — POCT CARDIAC MARKERS: Troponin i, poc: <0.05 ng/mL (ref 0.00–0.09)

## 2010-10-24 ENCOUNTER — Emergency Department (HOSPITAL_COMMUNITY): Payer: Medicare Other

## 2010-10-24 ENCOUNTER — Emergency Department (HOSPITAL_COMMUNITY)
Admission: EM | Admit: 2010-10-24 | Discharge: 2010-10-24 | Disposition: A | Discharge status: Home / Self Care | Payer: Medicare Other | Attending: Emergency Medicine | Admitting: Emergency Medicine

## 2010-10-24 DIAGNOSIS — F17200 Nicotine dependence, unspecified, uncomplicated: Secondary | ICD-10-CM | POA: Insufficient documentation

## 2010-10-24 DIAGNOSIS — J449 Chronic obstructive pulmonary disease, unspecified: Secondary | ICD-10-CM | POA: Insufficient documentation

## 2010-10-24 DIAGNOSIS — J4489 Other specified chronic obstructive pulmonary disease: Secondary | ICD-10-CM | POA: Insufficient documentation

## 2010-10-24 DIAGNOSIS — R059 Cough, unspecified: Secondary | ICD-10-CM | POA: Insufficient documentation

## 2010-10-24 DIAGNOSIS — R05 Cough: Secondary | ICD-10-CM | POA: Insufficient documentation

## 2010-10-24 DIAGNOSIS — R079 Chest pain, unspecified: Secondary | ICD-10-CM | POA: Insufficient documentation

## 2010-10-24 LAB — BASIC METABOLIC PANEL
Calcium: 8.7 mg/dL (ref 8.4–10.5)
Creatinine, Ser: 1.09 mg/dL (ref 0.4–1.5)
GFR calc Af Amer: >60 mL/min (ref >60)
GFR calc non Af Amer: >60 mL/min (ref >60)
Sodium: 135 mEq/L (ref 135–145)

## 2010-10-24 LAB — DIFFERENTIAL
Basophils Absolute: 0 10*3/uL (ref 0.0–0.1)
Basophils Relative: 0 % (ref 0–1)
Eosinophils Absolute: 0.2 10*3/uL (ref 0.0–0.7)
Monocytes Absolute: 0.9 10*3/uL (ref 0.1–1.0)
Monocytes Relative: 9 % (ref 3–12)
Neutrophils Relative %: 60 % (ref 43–77)

## 2010-10-24 LAB — CBC
MCH: 32.1 pg (ref 26.0–34.0)
MCHC: 35.9 g/dL (ref 30.0–36.0)
Platelets: 188 10*3/uL (ref 150–400)
RBC: 5.27 MIL/uL (ref 4.22–5.81)

## 2010-10-24 LAB — POCT CARDIAC MARKERS
CKMB, poc: <1 ng/mL — ABNORMAL LOW (ref 1.0–8.0)
Myoglobin, poc: 60.4 ng/mL (ref 12–200)

## 2010-11-25 ENCOUNTER — Inpatient Hospital Stay (INDEPENDENT_AMBULATORY_CARE_PROVIDER_SITE_OTHER)
Admission: RE | Admit: 2010-11-25 | Discharge: 2010-11-25 | Disposition: A | Discharge status: Home / Self Care | Payer: Medicare Other | Source: Ambulatory Visit | Attending: Family Medicine | Admitting: Family Medicine

## 2010-11-25 DIAGNOSIS — IMO0001 Reserved for inherently not codable concepts without codable children: Secondary | ICD-10-CM

## 2010-11-26 NOTE — H&P (Signed)
NAME:  Ryan [name], Ryan [name]                 ACCOUNT NO.:  [id number]   MEDICAL RECORD NO.:  [id number]          PATIENT TYPE:  INP   LOCATION:  IC07                          FACILITY:  [facility]   PHYSICIAN:  Skeet Latch, DO    DATE OF BIRTH:  14-Jan-1961   DATE OF ADMISSION:  12/05/2008  DATE OF DISCHARGE:  LH                              HISTORY & PHYSICAL   CHIEF COMPLAINT:  Chest pain and shortness of breath.   HISTORY OF PRESENT ILLNESS:  This is a 50 year old male who presents  with chest pain.  He said it started tonight.  The patient states he  felt like his heart was racing.  He began to have shortness of breath  while lying down.  The patient is on Cardizem for history of atrial  fibrillation.  He e has not been taking his Cardizem as directed  secondary to financial issues and has been out for several months.  The  patient was taking his Cardizem, I believe, twice a day instead of 4  times a day.  He did take a dose prior to the emergency room as well as  an albuterol treatment but states that the pain and shortness of breath  did not improve so the patient came to emergency room to be evaluated.   PAST MEDICAL HISTORY:  1. Atrial fibrillation.  2. COPD.   SURGICAL HISTORY:  Vasectomy, biopsy of lymphatic structure, 2 cardiac  catheterizations and 2 stent placements.   FAMILY HISTORY:  Positive for diabetes, hypertension, coronary artery  disease.   SOCIAL HISTORY:  No history of alcohol; does admit to cannabis use, is a  cigarette smoker.  Lives with spouse.   HOME MEDICATIONS:  1. Aspirin 81 mg daily.  2. Diltiazem 30 mg three times a day.  3. Simvastatin 20 mg at bedtime.  4. Spiriva inhaler daily.  5. Clonazepam 0.5 mg twice a day.  6. Valium as needed.   ALLERGIES:  No known drug allergies.   REVIEW OF SYSTEMS:  Please see HPI.   PHYSICAL EXAMINATION:  VITAL SIGNS: Temperature 97, heart rate 96, blood  pressure 85/65, respiratory rate 24, satting 96% on 2  liters.  GENERAL: Well-nourished, well-hydrated, well-developed.  No acute  distress.  HEENT: Head atraumatic, normocephalic.  Eyes: PERRLA.  EOMI.  No scleral  icterus.  NECK: Soft, supple, nontender, nondistended.  CARDIOVASCULAR:  Irregular irregular rhythm.  No murmurs or rubs.  LUNGS: Clear; slightly decreased breath sounds right side versus left  side.  No rales or rhonchi.  ABDOMEN: Soft, nontender, nondistended.  Positive bowel sounds.  No  rigidity or guarding.  EXTREMITIES:  No clubbing, cyanosis or edema.   LABORATORY DATA:  Sodium 137, potassium 3.7, chloride 107, CO2 23,  glucose 148, BUN 17, creatinine 0.94.  Myoglobin 82.8, CK-MB less than  one, troponin less than 0.05.  White count 12,000, hemoglobin 17.3,  hematocrit 49.2, platelet count 181,000.  Chest x-ray showed COPD with  questionable early right basilar infiltrate.   IMPRESSION:  1. Probable pneumonia.  2. History of chronic obstructive pulmonary disease.  3.  History of atrial fibrillation.   PLAN:  1. For his pneumonia, the patient will placed on IV antibiotics.  Will      obtain sputum cultures.  The patient will be placed on oxygen and      nebulizer treatments.  2. COPD.  The patient is obviously wheezing at this time.  Will      continue the patient on his home inhalers and the patient again      will be on oxygen to try to keep his sats greater than 88-90%.  3. Atrial fibrillation.  The patient is tachycardiac at times.  Will      place the patient back on his oral Cardizem and continue to      monitor.  I do not feel at this time the patient needs to be on IV      diltiazem.  We will continue to monitor closely.  4. For his tobacco abuse, the patient will placed on nicotine patch      and will receive counseling regarding tobacco cessation.  5.      Lastly the patient will be on DVT as well as GI prophylaxis.      Skeet Latch, DO  Electronically Signed     SM/MEDQ  D:  12/05/2008  T:   12/05/2008  Job:  [id number]

## 2010-11-26 NOTE — Assessment & Plan Note (Signed)
[facility]                       [place] [facility] OFFICE NOTE   NAME:Ryan [name], Ryan [name]                        MRN:          [id number]  DATE:01/05/2008                            DOB:          08-Feb-1961    REFERRING PHYSICIAN:  None.   Mr. Hoeffner returns to the office following a recent admission to [facility] with paroxysmal atrial fibrillation.  He converted  spontaneously.  His principal symptom was dizziness.  He returned to the  emergency department 2 days ago complaining of chest discomfort.  This  was nonradiating and not associated with exertion.  There was some  associated dyspnea.  Symptoms resolved spontaneously.  No arrhythmia was  documented.  Today, he feels fine.   A graded exercise test was performed.  He achieved a workload of seven  METs and a heart rate of 134, which was somewhat less than 80% of age-  predicted maximum.  There was no angina and no EKG changes.  He did have  substantial dyspnea with a decline in oxygen saturation from a resting  value of 96 to the mid 80s during exercise.  There was rapid recovery in  oxygenation after cessation of exercise.   Current medications include only diltiazem 30 mg t.i.d. and aspirin 81  mg daily.   PHYSICAL EXAMINATION:  A pleasant, thin gentleman in no acute distress.  The heart rate is 80 and regular, blood pressure 100/80, respirations  14.  No jugular venous distention noted; normal carotid upstrokes  without bruits.  LUNGS:  Increased AP diameter; decreased breath sounds at the bases.  CARDIAC:  Normal first and second heart sounds; fourth heart sound and  modest systolic ejection murmur present.  ABDOMEN:  Soft and nontender; no bruits.  EXTREMITIES:  No edema; distal pulses intact.   EKG:  Normal sinus rhythm; right ventricular conduction delay; otherwise  unremarkable.  Stress EKG:  Insignificant upsloping ST-segment depression.   IMPRESSION:  Mr. Michels does  not appear to have significant coronary  artery disease.  Oxygen desaturations likely related to his chronic  obstructive pulmonary disease.  He might benefit from portable oxygen,  perhaps in the form of a concentrator.  This should at least be tried to  determine if it improves his exercise capacity.   He experienced recurrent chest discomfort the other day without clear  cause.  This does not appear to represent myocardial ischemia.  If  symptoms persist, he will require either additional  empiric therapy, additional testing, or both.  I will plan to reassess  this nice gentleman again in 6 months.  He is scheduled to see Dr. Tawanna Cooler  in the [place] office in 1-2 weeks.     Gerrit Friends. Dietrich Pates, MD, [facility]  Electronically Signed    RMR/MedQ  DD: 01/05/2008  DT: 01/06/2008  Job #: [id number]

## 2010-11-26 NOTE — Assessment & Plan Note (Signed)
[facility]                       [place] [facility] OFFICE NOTE   NAME:Ryan [name], Ryan [name]                        MRN:          [id number]  DATE:12/23/2007                            DOB:          March 07, 1961    CARDIOLOGIST:  Ryan [name] is new to Dr. Dietrich Pates.   PRIMARY CARE PHYSICIAN:  None.   REASON FOR VISIT:  Lightheadedness.   HISTORY OF PRESENT ILLNESS:  Ryan [name] is a 50 year old male [name] who  we just saw at [facility] this week after being admitted for  atrial fibrillation.  Ryan [name] converted to normal sinus rhythm and Ryan [name] was  switched over to aspirin.  Ryan [name] was also placed on diltiazem 30 mg 4 times  a day.  Yesterday, Ryan [name] was working outside and became overheated.  Ryan [name]  felt dizzy and lightheaded.  Ryan [name] checked his blood pressure and Ryan [name]  obtained a number of 90 systolically.  His heart rate was in Ryan 50s as  well.  Ryan [name] continues to complain of chest discomfort.  This is a tight  sensation present when Ryan [name] exerts himself.  Ryan [name] denies any significant  changes in shortness of breath.  Denies orthopnea, PND, or pedal edema.  Denies any syncope.   CURRENT MEDICATIONS:  1. Diltiazem 30 mg 4 times a day.  2. Aspirin 81 mg daily.   PHYSICAL EXAMINATION:  GENERAL:  Ryan [name] is a well nourished, well developed  male, in no acute distress.  VITAL SIGNS:  Blood pressure is 110/74, pulse 78, and weight 125 pounds.  HEENT:  Normal.  NECK:  Without JVD.  CARDIAC:  Normal S1 and S2.  Distant heart sounds.  Regular rate and  rhythm.  LUNGS:  With decreased breath sounds bilaterally.  No rales.  No  wheezing.  ABDOMEN:  Soft and nontender.  EXTREMITIES:  Without edema.  NEUROLOGIC:  Ryan [name] is alert and oriented x3.  Cranial nerves II-XII are  grossly intact.   Electrocardiogram reveals sinus rhythm with a heart rate of 78, normal  axis, no acute changes.   IMPRESSION:  1. Paroxysmal atrial fibrillation.      a.     Maintaining sinus rhythm.      b.     Low  thromboembolic risk factor profile - aspirin therapy.  2. Nonobstructive coronary artery disease by catheterization in 2007      (left main 20% ostial, LAD 30-40% proximal, and left circumflex mid      20%).  3. Chest pain.  4. Dizziness.  5. Good left ventricular function.  6. Dyslipidemia.      a.     Lipid panel on December 20, 2007:  Total cholesterol 147,       triglycerides 79, HDL 30, and LDL 101.  7. Chronic obstructive pulmonary disease.   PLAN:  1. Ryan [name] returns to Ryan office today for earlier than planned      followup secondary to some lightheadedness yesterday.  It sounds as      though Ryan [name] had some symptomatic hypotension.  Ryan [name] was apparently      bradycardic as well.  His  heart rate and blood pressure looked      better today.  In any event, Ryan [name] probably does not need as much      diltiazem as we are giving Ryan [name].  I have cut back his diltiazem to      30 mg q.8 hours.  2. I have instructed Ryan [name] to take 30 mg of diltiazem daily p.r.n.      prolonged palpitations.  I have gone over this with Ryan [name]      extensively.  3. Ryan [name] does continue to complain of chest pain.  It is unlikely that Ryan [name]      developed significant coronary artery disease since his      catheterization 2 years ago.  In any event, Ryan [name] certainly has risk      of progression of disease.  I have set Ryan [name] up for a stress Myoview      study to rule out possibility of ischemia.  4. Ryan [name] had evidence of coronary plaquing by catheterization in      2007.  His LDL is over 70.  I have placed Ryan [name] on simvastatin 20 mg      nightly.  Ryan [name] will get lipids and LFTs done in 3 months' time for      followup.  5. Ryan [name] needs a primary care physician.  We will try to set Ryan [name] up with      a physician at [facility].  Ryan [name] works at [employer]      close to that office and this will be convenient for Ryan [name].      Apparently, there are no primary care physicians in [place] that      accept his insurance.  6. Ryan [name] will  come back for followup as planned in Ryan next      couple of weeks.  This is his routine followup appointment from      [facility] from [facility].      Tereso Newcomer, PA-C  Electronically Signed      Noralyn Pick. Eden Emms, MD, [facility]  Electronically Signed   SW/MedQ  DD: 12/23/2007  DT: 12/24/2007  Job #: [id number]

## 2010-11-26 NOTE — Group Therapy Note (Signed)
NAME:  Ryan [name], Ryan [name]                 ACCOUNT NO.:  [id number]   MEDICAL RECORD NO.:  [id number]          PATIENT TYPE:  INP   LOCATION:  IC04                          FACILITY:  [facility]   PHYSICIAN:  Osvaldo Shipper, MD     DATE OF BIRTH:  [birth year]   DATE OF PROCEDURE:  12/20/2007  DATE OF DISCHARGE:                                 PROGRESS NOTE   SUBJECTIVE:  The patient has slept well overnight.  He denies any chest  pain or shortness of breath.  No new complaints are offered.   OBJECTIVE:  He had been afebrile.  Heart rate has been varying from 60s  to 80s.  Blood pressure 94/66, respiratory rate 16, saturation 95% on 2  L, and he dropped down to 88% on 2 L overnight.  TELEMETRY:  Some of the recordings show pauses of up to 2 seconds.  The lungs actually sound clear to auscultation.  No wheezing, rales or  rhonchi.  CARDIOVASCULAR:  S1, S2 is irregular.  ABDOMEN:  Soft.  EXTREMITIES:  No edema.   LABORATORY DATA:  CBC is unremarkable.  BMET is pending.  Cardiac  markers unremarkable.  His urine drug screen was positive opiates and  marijuana.  EKG this morning shows atrial fibrillation once again.  No  other acute ST or T-wave changes are noted.   ASSESSMENT:  1. New-onset atrial fibrillation.  Cardizem drip was discontinued      overnight because of some pauses.  The maximum I see is a 2-second      pause.  The patient is waiting to be seen by [facility].      He is awaiting an echocardiogram.  His TSH and free T4 have come      back as normal.  I wonder if this patient will be a candidate for      pacemaker because of his slow atrial fibrillation as well as the      fact that he is having pauses.  I would, of course, defer to the      cardiologist on this matter. Elective dardioversion to also to be      considered.  2. Counseling was provided for tobacco as well as marijuana abuse.  3. He is currently on DVT and GI prophylaxis.  He is actually on full-      dose  anticoagulation at this time.  4. COPD with occasional hypoxia: ABG on room air. He is on nebs.      Consider advair.   So we will basically await cardiology input.  I am hoping once they see  him today he will be able to go up to the regular floor.  I would defer  the pacemaker issue to the cardiologist at this time.      Osvaldo Shipper, MD  Electronically Signed     GK/MEDQ  D:  12/20/2007  T:  12/20/2007  Job:  [id number]   cc:   Mila Homer. Sudie Bailey, M.D.  Fax: [phone]

## 2010-11-26 NOTE — Discharge Summary (Signed)
Ryan [name], Ryan [name]                 ACCOUNT NO.:  [id number]   MEDICAL RECORD NO.:  [id number]          PATIENT TYPE:  INP   LOCATION:  A326                          FACILITY:  [facility]   PHYSICIAN:  Renee Ramus, MD       DATE OF BIRTH:  Mar 19, 1961   DATE OF ADMISSION:  12/05/2008  DATE OF DISCHARGE:  05/27/2010LH                               DISCHARGE SUMMARY   PRIMARY DISCHARGE DIAGNOSIS:  Community-acquired pneumonia.   SECONDARY DIAGNOSES:  1. Atrial fibrillation.  2. Atypical chest pain.  3. Chronic obstructive pulmonary disease.  4. Tobacco abuse.  5. Anxiety.  6. Hyperlipidemia.   HOSPITAL COURSE:  1. Community-acquired pneumonia.  The patient is a 50 year old male,      who was admitted secondary to chest pain and shortness of breath.      The patient was seen in the emergency department was admitted to      our service.  The patient admits being noncompliant with his      Cardizem for his atrial fibrillation.  The patient also had chest x-      ray that showed suggestion of infiltrate in the right lower lobe      and the patient did have an elevated white blood cell count.  The      patient was started on antibiotics.  He has now had clinical      improvement.  His white count has dropped to normal.  He is being      discharged to home with instructions to follow up with his primary      care physician as needed.  The patient will continue antibiotics      for approximately 4 days postdischarge.  2. Atypical chest pain.  We believe that this is likely pleuritis and      not indicative of acute coronary syndrome.  We do not believe      further testing is warranted.  The patient was seen by Cardiology      and they agree with that assessment.  He is now being discharged to      home with limited supply of pain medication.  3. Atrial fibrillation.  The patient is relatively well rate      controlled currently and does not require additional medications.      CHADS2 score  is 1.  We are continuing him on aspirin 81 mg p.o.      daily for stroke prophylaxis.  4. COPD.  The patient will continue Spiriva.  He is 93% on room air.      He does not require additional oxygen and we have counseled him      with respect to cigarette smoking and he is inclined to stop      smoking at this time.  5. Anxiety.  The patient will continue clonazepam and Valium as      needed.  6. Hyperlipidemia.  The patient will continue statin therapy.   LABORATORY OF NOTE:  White count 10.6, H and H 16.2 and 46, MCV 88,  platelets 184.  1. Leukocytosis with white count of 10.6 decreasing to 6.3.  2. Initial BUN of 15 with creatinine of 0.95 is now decreased to BUN      11 and creatinine has increased to 1.21.  3. Initial blood glucose of 167 decreasing to 70.  4. BNP of 217.  5. Total cholesterol of 165 with an LDL of 111, HDL of 28.  6. TSH of 4.95 with a free T4 of 1.11.   STUDIES:  1. Two-view of the chest on admission showing possible right lower      lobe infiltrate with ampicillin, but his changes compatible with      COPD.  2. Followup chest film showing resolution of right lower lobe      infiltrate with COPD changes remaining.   MEDICATIONS ON DISCHARGE:  1. Aspirin 81 mg p.o. daily.  2. Diltiazem hydrochloride 30 mg p.o. t.i.d.  3. Simvastatin 20 mg p.o. nightly.  4. Clonazepam 0.5 mg p.o. b.i.d.  5. Valium 5 mg p.o. daily p.r.n.  6. Spiriva 18 mcg inhaled daily.  7. Ciprofloxacin 500 mg p.o. b.i.d. x4 days.  8. Percocet 5/325 1-2 p.o. q.6 h p.r.n. pain.  There are no labs or      studies pending at time of discharge.  The patient is in stable      condition and anxious for discharge.   TIME SPENT:  35 minutes.      Renee Ramus, MD  Electronically Signed     JF/MEDQ  D:  12/07/2008  T:  12/08/2008  Job:  [id number]   cc:   Tinnie Gens A. Tawanna Cooler, MD  [address] [place]  [place] [address]

## 2010-11-26 NOTE — H&P (Signed)
NAME:  Ryan [name], Ryan [name]                 ACCOUNT NO.:  [id number]   MEDICAL RECORD NO.:  [id number]          PATIENT TYPE:  INP   LOCATION:  IC04                          FACILITY:  [facility]   PHYSICIAN:  Osvaldo Shipper, MD     DATE OF BIRTH:  1961-07-11   DATE OF ADMISSION:  12/19/2007  DATE OF DISCHARGE:  LH                              HISTORY & PHYSICAL   PRIMARY MEDICAL DOCTOR:  Dr. Sudie Bailey, although he has not seen Dr.  Sudie Bailey in a year.   ADMITTING DIAGNOSES:  1. New-onset atrial fibrillation with rapid ventricular response.  2. Tobacco abuse.  3. Marijuana use.   CHIEF COMPLAINT:  Chest pain, dizziness since 10:00 this morning.   HISTORY OF PRESENT ILLNESS:  The patient is a 50 year old Caucasian male  who has really no past medical history other than tobacco abuse.  He has  had some chest pain in the past for which he has had cardiac caths which  have been unremarkable.  He is on no scheduled medications at home.  He  was in his usual state of health at 10 o'clock this morning after he  returned from work.  He works in housekeeping at [employer].  He suddenly  felt dizzy, lightheaded, started having chest pain and shortness of  breath.  He felt tingling and numbness in his arms.  Did not have a  syncopal episode.  The patient mentioned this to his wife.  However,  they chose not to come into the ED immediately.  He tried to take a nap,  but did not feel any better and then decided to come in.  In the ED,  when he was brought in, he was found to be in rapid ventricular  response.   The patient says his chest pain is resolving.  His shortness of breath  is better.  His dizziness is improved as well.   MEDICATIONS AT HOME:  None.   ALLERGIES:  No known drug allergies.   PAST MEDICAL HISTORY:  Really quite unremarkable.  No surgeries in the  past.  No medical problems.  No diabetes.  He had a cardiac cath in  April 2007 which did not reveal any significant obstructive  coronary  artery disease.  His left ventricular function was normal.  This was  done by Dr. Gala Romney with [facility] cardiology.   SOCIAL HISTORY:  Lives in [place] with his wife.  Works in  housekeeping as a Copy.  He smokes a pack of cigarettes on a daily  basis.  No significant alcohol use.  He admits to doing marijuana  yesterday.  Denied any cocaine use.  He is fairly active.   FAMILY HISTORY:  Positive for diabetes, heart disease, hypertension.   REVIEW OF SYSTEMS:  GENERAL:  Positive for weakness.  HEENT:  Unremarkable.  CARDIOVASCULAR:  As in HPI.  GI: Unremarkable.  GU:  Unremarkable.  RESPIRATORY:  As in HPI.  MUSCULOSKELETAL:  Unremarkable.  NEUROLOGIC:  Unremarkable.  PSYCHIATRIC:  Unremarkable.  Other systems  unremarkable.   PHYSICAL EXAMINATION:  VITAL SIGNS:  When he  presented to the ED he was  found to have a temperature of 96.7, blood pressure was 75/54; heart  rate at that time was recorded as 72 but apparently it was faster than  that.  Saturation 98%; respiratory rate 20.  Currently in the ICU on the  Cardizem drip his heart rate is running in the 70's, still A-fib, blood  pressure 94/64 with a mean of 72.  Respiratory rate is stable.  Saturations are stable.  He is on O2 at this time.  GENERAL:  Thin white male in no distress, slightly anxious.  HEENT: There is no pallor, no icterus.  Oral mucous membranes moist.  No  oral lesions are noted.  NECK:  Soft and supple.  No thyromegaly is appreciated.  LUNGS:  Reveal a few rhonchi bilaterally.  Few wheezes but mostly clear  to auscultation.  CARDIOVASCULAR:  S1, S2, irregular, normal rate.  No S3, S4; no JVD is  noted.  No bruits are appreciated.  ABDOMEN:  Soft, nontender, nondistended.  Bowel sounds present.  No mass  or organomegaly is appreciated.  EXTREMITIES:  No edema.  NEUROLOGIC:  He is alert, oriented x3.  No focal neurological deficits  are present.   LABORATORY DATA:  CBC, CMET,  magnesium, and D-dimer all unremarkable.  His troponin initially was less than 0.05, subsequently 0.09.  CK-MB is  normal.  BNP was normal.  EKG shows A-fib with RVR.  No significant ST  changes are present.  He had a chest x-ray which showed there is  evidence of COPD with no active lung disease.   ASSESSMENT/PLAN:  This is a 50 year old Caucasian male with no  significant medical problems except for tobacco abuse and use of  marijuana last night who presents with chest pain, palpitations,  dizziness and found to have atrial fibrillation with rapid ventricular  response.  1. Atrial fibrillation with rapid ventricular response. He is      currently on a Cardizem drip.  Heart rate is very well-controlled,      and blood pressure is reasonable.  He is on full-dose Lovenox.       Cardiology has been consulted and will see him in the      morning.  Fasting lipid profile will be checked.  TSH level will be      checked.  Echocardiogram will be checked.  If his rate and blood      pressure become a problem, we may have to use digoxin.  In that      case, I will definitely talk with the cardiologist at [facility] and see      if he needs to be transferred, but at this time the patient appears      to be quite stable.  2. He was counseled regarding his tobacco abuse as well as marijuana      use.  I will not use a nicotine patch at this time because of the      atrial fibrillation.  This may be considered at a later date.  3. Urine drug screen will be checked.  I will use Xopenex nebulizers      every 8 hours for some wheezing that he has.  This is likely from      his smoking.  4. Deep venous thrombosis and gastrointestinal prophylaxes initiated      per ICU protocol.   The patient is a full code.  He states that he will try to reconnect  with Dr.  Sudie Bailey or will seek out another primary medical doctor when  he is discharged from this current hospital stay.  Osvaldo Shipper,  MD  Electronically Signed     GK/MEDQ  D:  12/19/2007  T:  12/19/2007  Job:  [id number]   cc:   Mila Homer. Sudie Bailey, M.D.  Fax: [phone]   Gerrit Friends. Dietrich Pates, MD, [facility]  [address]  [place], [place] [address]

## 2010-11-26 NOTE — Consult Note (Signed)
NAMESOHAN, POTVIN                 ACCOUNT NO.:  [id number]   MEDICAL RECORD NO.:  [id number]          PATIENT TYPE:  INP   LOCATION:  IC07                          FACILITY:  [facility]   PHYSICIAN:  Gerrit Friends. Dietrich Pates, MD, FACCDATE OF BIRTH:  [birth year]   DATE OF CONSULTATION:  12/05/2008  DATE OF DISCHARGE:                                 CONSULTATION   PRIMARY CARE PHYSICIAN:  Tinnie Gens A. Tawanna Cooler, MD   PRIMARY CARDIOLOGIST:  Gerrit Friends. Dietrich Pates, MD, [facility]   HISTORY OF PRESENT ILLNESS:  A 50 year old gentleman with chronic  obstructive pulmonary disease and previous evaluation for chest  discomfort with a negative standard treadmill test, now referred for  assessment of recurrent chest discomfort.  Mr. Cravens has noted a cough  productive of mucoid sputum for the few days prior to admission.  He has  had no subjective sense of fever and no rigors.  He did note progressive  dyspnea on exertion, which prompted him to come to the emergency  department where a question of pneumonia was raised on chest x-ray.  He  has had vague right-sided chest discomfort radiating across the chest  that began prior to presentation to the hospital.  Moderate-strength  analgesics have not relieved this symptom.  There is no radiation.  There is no relationship to movement.  There is a pleuritic component.  There is some chest wall tenderness.  He has had no associated nausea  nor diaphoresis.  He reports no hemoptysis.  He subsequently developed  palpitations and was noted to have atrial fibrillation with a rapid  ventricular response.  With intravenous diltiazem, his heart rate has  slowed.   Mr. Rochin was first seen by me in mid 2009 when he presented with atrial  fibrillation.  He had undergone catheterization in April 2007 revealing  nonobstructive coronary disease.   Past medical history is otherwise notable for severe COPD with class III  exertional dyspnea.  He has had fasting hyperglycemia but not  frank  diabetes.  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  He  has undergone vasectomy.   He reports allergies to PENICILLIN and DARVON.   Medications on admission included:  1. Aspirin 81 mg daily.  2. Diltiazem 30 mg q.i.d.  He was taking only b.i.d.  3. Simvastatin 20 mg daily.  4. Spiral 1 inhalation daily.  5. Clonazepam 0.5 mg b.i.d. with additional Valium as needed.   FAMILY HISTORY:  Mother suffered a myocardial infarction in her 21s, but  is alive in her 64s.  His sister also suffered myocardial infarction at  an young age.   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  A 52 pack-year  history of smoking that continues at 1 pack per day.  No excessive use  of alcohol; occasional use of marijuana.   REVIEW OF SYSTEMS:  He reports a chronic cough with sputum production.  He has a history of seizures and intermittent dizziness.  He frequently  experiences muscular leg cramps.  He eats a regular diet.  He has  impaired vision and requires corrective lenses for reading.  He is  edentulous.  He has a history of depression and anxiety.  He has some  insomnia with middle of the night awakening.  All other systems reviewed  and are negative.   PHYSICAL EXAMINATION:  GENERAL:  Pleasant thin gentleman in no acute  distress.  VITAL SIGNS:  The temperature is 98, heart rate 70 and irregular,  respirations 20, O2 saturation 93% on 2 L, blood pressure 100/60.  HEENT:  Anicteric sclerae; normal lids and conjunctivae; normal oral  mucosa; edentulous.  NECK:  No jugular venous distention; normal carotid upstrokes without  bruits.  LUNGS:  Clear with increased AP diameter.  CARDIAC:  Irregular rhythm; normal first and second heart sounds; no  murmurs nor gallops.  ABDOMEN:  Scaphoid; soft and nontender; normal bowel sounds.  No bruits.  EXTREMITIES:  Distal pulses intact; no edema.   NEUROMUSCULAR:  Symmetric strength and tone; normal cranial nerves.  SKIN:  No significant lesions.  PSYCHIATRIC:  Alert and oriented; normal affect.   EKG:  Atrial fibrillation, initially with a rapid ventricular response;  leftward axis; intermittent PVCs; right ventricular conduction delay.   TELEMETRY:  One 12-beat run of ventricular tachycardia that was  asymptomatic.   CHEST X-RAY:  Severe emphysematous changes; chronic peribronchial  thickening; slightly increased density at the right base.  Chest x-ray  images were reviewed.  There is am increased density at the right base  that certainly could represent a pneumonia but is nondiagnostic.   Laboratory studies otherwise notable for negative cardiac markers, a  minimal leukocytosis at 10,600 with no left shift, normal chemistry  profile except for fasting glucose of 167, and normal cardiac markers.   IMPRESSION:  Mr. Isham presents with atypical chest discomfort that is  more suggestive of a musculoskeletal etiology than of cardiac origin.  Appropriate analgesia can be provided.  I would not be inclined to  pursue additional testing for ischemic heart disease at the present  time.   He has recurrent atrial fibrillation.  His principal risk for  thromboembolism is hypertension.  Due to his social situation, he has  not been compliant with medication or medical followup.  I would not be  inclined to initiate chronic anticoagulation at the present time.   Heart rate control has been rendered more difficult by relative  hypotension.  We will start digoxin and hold other weight control  medications which would tend to lower his blood pressure.  A beta-  blocker would probably be preferable to diltiazem as digoxin does not  prove adequate.   An echocardiogram and TSH level are pending.  If he does in fact have  pneumonia and atrial fibrillation in that setting, then spontaneous  conversion to sinus rhythm is likely over the next  few days.  We  appreciate the opportunity to re-involve ourselves with this gentleman's  care and will be happy to follow him with you.      Gerrit Friends. Dietrich Pates, MD, [facility]  Electronically Signed     RMR/MEDQ  D:  12/05/2008  T:  12/06/2008  Job:  [id number]

## 2010-11-26 NOTE — Group Therapy Note (Signed)
NAME:  Ryan [name], Ryan [name]                 ACCOUNT NO.:  [id number]   MEDICAL RECORD NO.:  [id number]          PATIENT TYPE:  INP   LOCATION:  A326                          FACILITY:  [facility]   PHYSICIAN:  Dorris Singh, DO    DATE OF BIRTH:  Feb 02, 1961   DATE OF PROCEDURE:  12/06/2008  DATE OF DISCHARGE:                                 PROGRESS NOTE   Patient seen today.  States he feels pretty good.  Has not had any  aberrant rhythms.  His blood pressure and his heart rate have remained  stable.   PHYSICAL EXAMINATION:  CURRENT VITALS:  Temperature 97.9, pulse 96,  respirations 8, blood pressure 112/64.  GENERAL:  Patient is well-developed and well-nourished in no acute  distress.  HEART:  Regular rate and rhythm.  LUNGS:  Clear to auscultation bilaterally.  ABDOMEN:  Soft, nontender, nondistended.  EXTREMITIES:  Positive pulses.  No edema, ecchymosis, or cyanosis.   He does not have any labs ordered for today, so we will go ahead and  make sure he has labs pending for tomorrow.   ASSESSMENT:  1. Pneumonia.  2. Chronic obstructive pulmonary disease.  3. History of atrial fibrillation.  4. History of bradycardia.   PLAN:  Continue patient on current regimen.  Will continue to monitor  him.  Keep him on his IV antibiotics.  As for his COPD, we will go ahead  and keep him on his breathing treatment.  Cardiology is following his  atrial fibrillation and dysrhythmias.  Will continue to monitor and  change therapy as necessary.      Dorris Singh, DO  Electronically Signed     CB/MEDQ  D:  12/06/2008  T:  12/06/2008  Job:  [id number]

## 2010-11-26 NOTE — Discharge Summary (Signed)
NAME:  Ryan [name], Ryan [name]                 ACCOUNT NO.:  [id number]   MEDICAL RECORD NO.:  [id number]          PATIENT TYPE:  INP   LOCATION:  A316                          [facility]ACILITY:  [facility]   PHYSICIAN:  Margaretmary Dys, M.D.DATE OF BIRTH:  07-Aug-1960   DATE OF ADMISSION:  12/19/2007  DATE OF DISCHARGE:  06/09/2009LH                               DISCHARGE SUMMARY   DISCHARGE DIAGNOSES:  1. New onset atrial fibrillation with rapid ventricular response.  2. History of chronic tobacco abuse.  3. History of chronic marijuana abuse.   DISCHARGE MEDICATIONS:  Lopressor 12.5 mg p.o. b.i.d.   FOLLOWUP:  He is to follow up Dr. Dietrich Pates of [facility] in the  next 2-3 weeks.   CONSULTATIONS OBTAINED:  [place] Bing, Cardiology.   REASON FOR CONSULTATION:  New onset atrial fibrillation with rapid  ventricular response.   PERTINENT LABORATORY DATA:  CBC, CMET, magnesium, and D-dimer were all  negative.  Cardiac enzymes also were negative.  BNP was normal.  A 12-  lead EKG shows atrial fibrillation with a rapid ventricular response.  The patient had no significant ST-T changes.  A chest x-ray shows COPD  with no active lung disease.   HOSPITAL COURSE:  This is a 50 year old male with no significant medical  problems who presented to the emergency room with chest pain,  palpitations, dizziness, and was found to be in atrial fibrillation with  rapid ventricular response.  The patient reported doing fairly well.  He  denies any alcohol binge.   The patient was subsequently started on a Cardizem drip.  He was also  placed on full-dose Lovenox and lipid profiles were checked.  The  patient was seen by cardiology here, who did not think it necessary for  the patient to be anticoagulated due to his low risk for stroke.  As a  result, this was discontinued.   The patient did not have any chest pain and did not rule in  for MI.  The patient was seen by cardiology and they recommended  discharging the  patient.  The patient only needed very low-dose diltiazem infusion.  The  patient was ultimately discharged to home in satisfactory condition.   DISPOSITION:  To home.   SPECIAL INSTRUCTIONS:  He has been advised to return to the emergency  room if he develops any more palpitations, chest pain, dizziness or  presyncopal episodes.      Margaretmary Dys, M.D.  Electronically Signed     AM/MEDQ  D:  02/02/2008  T:  02/02/2008  Job:  [id number]

## 2010-11-26 NOTE — Consult Note (Signed)
NAME:  Ryan, [name]                 ACCOUNT NO.:  [id number]   MEDICAL RECORD NO.:  [id number]          PATIENT TYPE:  INP   LOCATION:  A316                          FACILITY:  [facility]   PHYSICIAN:  Gerrit Friends. Dietrich Pates, MD, FACCDATE OF BIRTH:  07/13/1961   DATE OF CONSULTATION:  12/20/2007  DATE OF DISCHARGE:                                 CONSULTATION   REFERRING PHYSICIAN:  [facility] team B, Dr. Rito Ehrlich.   REASON FOR REFERRAL:  Atrial fibrillation.   HISTORY OF PRESENT ILLNESS:  Ryan [name] is a 50 year old male patient  with a history of nonobstructive coronary artery disease by  catheterization in April 2007 as well as COPD and ongoing tobacco abuse,  who was in his usual state of health until about 2-3 days ago.  At that  time, he began to note dizziness on occasion.  This would occur while  standing or while sitting.  Yesterday, he returned from work around 10  o'clock in the morning.  He went to go to the bedroom and sit down.  At  that time, he suddenly became more dizzy than usual and noted tachy  palpitations associated.  He also noted some associated chest heaviness,  shortness of breath, nausea, and diaphoresis.  He decided to come to the  emergency room where he was found to be in atrial fibrillation with a  heart rate of 150.  His initial blood pressure was 75/54.  Subsequent  blood pressures were in the 90s to low 100s.  He says that his symptoms  resolve with treatment in the emergency room.  He is currently on a  diltiazem drip, adjusted at 2.5 mg an hour.  His cardiac markers have  been negative.  His TSH was normal at 1.307 and his D-dimer was negative  at 0.22.   PAST MEDICAL HISTORY:  1. Nonobstructive coronary artery disease by catheterization in April      2007:  Left main ostial 20%, LAD proximal 30-40%, circumflex mid      20%; good LV function with an EF 55-60% by cardiac catheterization      in April 2007  2. COPD.  3. Borderline diabetes  mellitus.  4. Status post mastectomy.   MEDICATIONS PRIOR TO ADMISSION:  None.   ALLERGIES:  PENICILLIN and DARVON.   SOCIAL HISTORY:  The patient lives in [place] and is married, with 3  children.  He works as a Associate Professor.  He has a 47-pack-a-  year history of smoking, continues to smoke a pack of cigarettes per  day.  Denies alcohol abuse, but does admit to occasional marijuana use.   FAMILY HISTORY:  Significant for CAD.  His mother is alive in her mid  late 51s, had a myocardial function in her 5s.  His sister is still  alive in her 28s, had a myocardial infarction in her 30s.   REVIEW OF SYSTEMS:  Please see HPI.  Denies any fevers or chills.  He  does note a chronic cough.  His production of sputum has been greater  recently and he notes  yellowish sputum.  Denies any hemoptysis.  Denies  any bright blood per rectum or melena.  Denies any dysuria, hematuria,  or dysphagia.  Denies any vomiting, but has had recent diarrhea.  Denies  any skin or hair changes, weight loss or weight gain.  Rest of the  review of systems are negative.   PHYSICAL EXAMINATION:  GENERAL:  This is a well-nourished and well-  developed male, in no acute distress.  VITAL SIGNS:  Blood pressure 101/62, pulse 91, respirations 18,  temperature 97.5, and oxygen saturation 92% on 4 L.  His O2 sat did drop  in the mid 80s on room air.  HEENT:  Normal.  NECK:  Without JVD.  LYMPH:  Without lymphadenopathy.  ENDOCRINE:  Without thyromegaly.  CARDIAC:  Distant S1 and S2, irregularly irregular rhythm.  No murmurs  appreciated.  LUNGS:  With decreased breath sounds bilaterally.  No rales or wheezes.  ABDOMEN:  Soft and nontender with normoactive bowel sounds.  EXTREMITIES:  With positive digital clubbing noted bilaterally.  No  cyanosis or edema noted.  MUSCULOSKELETAL:  Without joint deformity.  NEUROLOGIC:  He is alert and oriented x3.  Cranial nerves II-XII grossly  intact.  SKIN:   Warm and dry.  VASCULAR:  No carotid bruits noted bilaterally.  No femoral artery  bruits noted bilaterally.  Dorsalis pedis and posterior tibialis pulses  are 2+ bilaterally.   Chest x-ray reveals COPD.  No active lung disease.  EKG reveals atrial  fibrillation with a heart rate of 73.  Normal axis.  No acute changes.   LABORATORY DATA:  1. White count 7400, hemoglobin 15.6, hematocrit 43.1, and platelet      count 149,000.  Sodium 138, potassium 4.2, BUN 12, creatinine 1.02,      and glucose 95.  TSH 1.307.  D-dimer 0.22.  Point-of-care marker:      CK-MB 2.8 and troponin I 0.09.  Regular markers:  CK 86, MB 1.9,      and troponin I 0.04.  2. CK 79, CK-MB 1.8, troponin I 0.02, INR 1.0, and magnesium 1.9.      Urine drug screen positive for THC and opiates.   IMPRESSION:  1. New-onset atrial fibrillation with variable heart rate control.      a.     CHADS2 score is less than 2.  2. Chest pain.  3. Chronic obstructive pulmonary disease with hypoxia.  4. Nonobstructive coronary artery disease by catheterization in 2007.  5. Borderline diabetes mellitus.  6. Family history of coronary artery disease.  7. Marijuana use.   PLAN:  1. [name] presents with new-onset atrial fibrillation.  His heart      rate is fairly well controlled.  He is just on low-dose diltiazem      at this point in time and his blood pressures are marginal.  He      should be able to tolerate low-dose metoprolol and receive good      heart rate control from this.  Therefore, we will stop the      diltiazem and we will replace it with metoprolol 12.5 mg b.i.d.  2. An echocardiogram has been ordered and the results are pending.  3. The patient will likely benefit from elective cardioversion.  We      will initiate Coumadin per pharmacy.  If he remains fairly      symptomatic with his atrial fibrillation, we could consider      proceeding with transesophageal echocardiogram-guided  cardioversion.  He will  likely not need long-term Coumadin as his      stroke was profound slow.  4. He had chest pain associated with atrial fibrillation.  His cardiac      markers have been negative.  He had nonobstructive disease by      catheterization at 2007 that was fairly minimal.  We may want to      consider outpatient stress testing at some point.   Thank you very much for the consultation.  We are glad to follow the  patient throughout the remainder of this admission.      Tereso Newcomer, PA-C      Gerrit Friends. Dietrich Pates, MD, [facility]  Electronically Signed    SW/MEDQ  D:  12/20/2007  T:  12/20/2007  Job:  [id number]   cc:   Mila Homer. Sudie Bailey, M.D.  Fax: [phone]   Gerrit Friends. Dietrich Pates, MD, [facility]  [address]  [place], [place] [address]

## 2010-11-28 LAB — HM COLONOSCOPY: HM Colonoscopy: NORMAL
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
## 2010-11-29 NOTE — Cardiovascular Report (Signed)
NAME:  Ryan [name], SLINEY NO.:  [id number]   MEDICAL RECORD NO.:  [id number]          PATIENT TYPE:  INP   LOCATION:  4711                         FACILITY:  [facility]   PHYSICIAN:  Arvilla Meres, M.D. LHCDATE OF BIRTH:  02-Oct-1960   DATE OF PROCEDURE:  10/16/2005  DATE OF DISCHARGE:                              CARDIAC CATHETERIZATION   IDENTIFICATION:  Mr. Blyden is a 50 year old male with a history of COPD and  ongoing tobacco use who was admitted with several week history of  progressive arm and chest pain which culminated on the day of admission.  He  previously had a cardiac cath 6 years ago which he told was completely  clean.  He has ruled out for myocardial infarction with serial cardiac  enzymes and is now referred to the diagnostic catheterization laboratory.   PROCEDURES PERFORMED:  1.  Selective coronary angiography.  2.  Left heart cath.  3.  Left ventriculogram.  4.  Angio-Seal closure.   DESCRIPTION OF PROCEDURE:  The risks and benefits of catheterization were  explained.  Consent was signed and placed on the chart.  A 6-French arterial  sheath was placed in the right femoral artery using a modified Seldinger  technique.  Standard catheters including preformed Judkins JL-4, JR-4 and  angled pigtail were used for the procedure.  All catheter exchanges were  made over wire.  There were no apparent complications.  At the end of the  procedure, the patient had his arteriotomy site sealed with an Angio-Seal  closure device.  There was good hemostasis.   Central aortic pressure was 105/67 with a mean of 84.  LV was 107/4 with an  LVEDP of 11.  There was no aortic stenosis.   Left main 20% ostial stenosis.   LAD was a long vessel coursing the apex.  It gave off two diagonal branches.  There was a 30-40% tubular lesion proximally.  The distal LAD had some  moderate diffuse disease but this was not obstructive.   Left circumflex was a moderate-sized  branching vessel.  It gave off a large  branching OM-1.  The distal AV groove circumflex was small.  There was a 20%  lesion in the mid left circumflex.   Right coronary artery is a large dominant vessel gave off an RV branch  moderate size PDA and a small PL.   Left ventriculogram done in the RAO position showed an EF of 55-60% with no  wall motion abnormalities.  There was no mitral regurgitation.   ASSESSMENT:  1.  Mild nonobstructive coronary disease.  2.  Normal left ventricular function.   PLAN/DISCUSSION:  I suspect his chest and arm pain is likely  musculoskeletal.  I question whether or not he had some component of  cervical disk disease.  He certainly will need risk factor modification to  prevent progression of his coronary disease, however.  He is otherwise  stable for discharge later today if his groin remains stable after bedrest.      Arvilla Meres, M.D. [facility]  Electronically Signed     DB/MEDQ  D:  10/16/2005  T:  10/17/2005  Job:  [id number]

## 2010-11-29 NOTE — H&P (Signed)
NAME:  Ryan [name], Ryan [name]                 ACCOUNT NO.:  [id number]   MEDICAL RECORD NO.:  [id number]          PATIENT TYPE:  EMS   LOCATION:  MAJO                         FACILITY:  [facility]   PHYSICIAN:  Jackie Plum, M.D.DATE OF BIRTH:  Oct 25, 1960   DATE OF ADMISSION:  10/16/2005  DATE OF DISCHARGE:                                HISTORY & PHYSICAL   CHIEF COMPLAINT:  Chest pain.   HISTORY OF PRESENT ILLNESS:  The patient is a 50 year old Caucasian  gentleman who presents without complaint.  Yesterday, it was said to be  sharp substernally, radiating to his left shoulder and arm.  He has had some  shortness of breath which is increased from baseline.  He denies any fever  or chills.  He has had some cough, productive of yellowish sputum.  He has  been followed by diaphoresis with dizziness without any vertiginous  signatures.  He has had some nausea and near syncopal episodes.  Pain is  said to be severe in intensity.  He has not had any PND or orthopnea to  appreciate palpitations.  No history of peptic ulcer disease or CHF or any  other cardiac condition.   PAST MEDICAL HISTORY:  1.  Chronic obstructive pulmonary disease.  2.  No diabetes.  3.  Hypertension.   FAMILY HISTORY:  Positive for heart disease.  His sister had heart attack at  35 years.   SOCIAL HISTORY:  The patient lives with his family.  He smokes one pack of  cigarettes daily for several years.  Drinks alcohol on a social basis.  Denies any illicit drug use.   REVIEW OF SYSTEMS:  As stated above, unremarkable.   PHYSICAL EXAMINATION:  GENERAL APPEARANCE:  The patient was not in acute  distress.  He seemed to be in mild to moderate distress.  His pain that  accompanied him has been evaluated by nitroglycerin but persists.  HEENT:  Normocephalic, atraumatic.  Pupils equal, round and reactive to  light. EOM Is intact.  Oropharynx is moist.  NECK:  Supple.  No JVD.  No carotid bruits.  LUNGS:  Breath sounds were  diminished and few wheezes.  CARDIAC:  The patient had cardiac rhythm with no rubs or gallops.  ABDOMEN:  Bowel sounds present.  EXTREMITIES:  No cyanosis or edema.  The patient had clubbing of his  fingers.   LABORATORY DATA:  1.  X-ray of the chest is pending.  2.  The patient had a 12-lead EKG which was read by the ED physician as      baseline rhythm, but will check review.  The patient's T wave as stated      and seems to be appropriate sinus rhythm.  It is a bit artifactual, and      I could not appreciate any acute ST wave changes.  3.  Sodium 134, potassium 3.8, chloride 102, BUN 11, glucose 109.  His pH      7.377, PCO2 38.6, bicarbonate 2.7, hemoglobin 16.7, hematocrit 49.0.      Point of care myoglobin 61.9.  Point of care CK  MB 1.1.  Point of care      Troponin I 0.05.  Drug screen was positive for tetrahydrocannabinol.   IMPRESSION:  1.  Chest pain in a patient with a family history of heart disease and      severe smoking.  2.  Marijuana abuse.  3.  Chronic obstructive pulmonary disease with heavy sedation.   PLAN:  He is admitted to telemetry bed to rule out protocol as evidenced in  [place].  Pain relieved with morphine. Cardiac enzymnes ordered at this time.  Discuss with Cardiology consultant, Dr. Bascom Levels, and had mentioned that the  patient may need cardiac catheterization.  Will start him on Avelox with  prednisone after IV Solu-Medrol x1, and continue him on nebulizations for  his mild COPD exhibition.  Will check fasting lipid panel.      Jackie Plum, M.D.  Electronically Signed     GO/MEDQ  D:  10/16/2005  T:  10/16/2005  Job:  [id number]   cc:   Lorain Childes, M.D. [facility]  [address]  [place]  [place] [address]

## 2011-02-18 ENCOUNTER — Encounter: Payer: Self-pay | Admitting: Family Medicine

## 2011-02-18 DIAGNOSIS — K222 Esophageal obstruction: Secondary | ICD-10-CM

## 2011-02-18 DIAGNOSIS — I2510 Atherosclerotic heart disease of native coronary artery without angina pectoris: Secondary | ICD-10-CM

## 2011-02-18 DIAGNOSIS — E119 Type 2 diabetes mellitus without complications: Secondary | ICD-10-CM | POA: Insufficient documentation

## 2011-02-18 DIAGNOSIS — R7303 Prediabetes: Secondary | ICD-10-CM

## 2011-03-12 ENCOUNTER — Inpatient Hospital Stay (HOSPITAL_COMMUNITY)
Admission: RE | Admit: 2011-03-12 | Discharge: 2011-03-12 | Disposition: A | Discharge status: Home / Self Care | Payer: Medicare Other | Source: Ambulatory Visit

## 2011-03-12 ENCOUNTER — Encounter (HOSPITAL_COMMUNITY): Payer: Medicare Other | Attending: Oncology | Admitting: Oncology

## 2011-03-12 ENCOUNTER — Encounter (HOSPITAL_COMMUNITY): Payer: Self-pay | Admitting: Oncology

## 2011-03-12 VITALS — BP 118/76 | HR 88 | Temp 98.1°F | Ht 68.0 in | Wt 138.8 lb

## 2011-03-12 DIAGNOSIS — D751 Secondary polycythemia: Secondary | ICD-10-CM

## 2011-03-12 DIAGNOSIS — Z79899 Other long term (current) drug therapy: Secondary | ICD-10-CM | POA: Insufficient documentation

## 2011-03-12 LAB — URINALYSIS, ROUTINE W REFLEX MICROSCOPIC
Bilirubin Urine: NEGATIVE
Glucose, UA: NEGATIVE mg/dL
Ketones, ur: NEGATIVE mg/dL
Nitrite: NEGATIVE
Protein, ur: NEGATIVE mg/dL
pH: 6 (ref 5.0–8.0)

## 2011-03-12 LAB — DIFFERENTIAL
Basophils Relative: 1 % (ref 0–1)
Lymphocytes Relative: 36 % (ref 12–46)
Lymphs Abs: 3 10*3/uL (ref 0.7–4.0)
Monocytes Absolute: 0.8 10*3/uL (ref 0.1–1.0)
Monocytes Relative: 9 % (ref 3–12)
Neutro Abs: 4.4 10*3/uL (ref 1.7–7.7)
Neutrophils Relative %: 52 % (ref 43–77)

## 2011-03-12 LAB — CBC
HCT: 48.6 % (ref 39.0–52.0)
Hemoglobin: 17.1 g/dL — ABNORMAL HIGH (ref 13.0–17.0)
MCH: 31 pg (ref 26.0–34.0)
MCHC: 35.2 g/dL (ref 30.0–36.0)
MCV: 88.2 fL (ref 78.0–100.0)
RBC: 5.51 MIL/uL (ref 4.22–5.81)

## 2011-03-12 LAB — COMPREHENSIVE METABOLIC PANEL
ALT: 25 U/L (ref 0–53)
Alkaline Phosphatase: 75 U/L (ref 39–117)
BUN: 14 mg/dL (ref 6–23)
Chloride: 103 mEq/L (ref 96–112)
GFR calc Af Amer: >60 mL/min (ref >60)
Glucose, Bld: 96 mg/dL (ref 70–99)
Potassium: 4.1 mEq/L (ref 3.5–5.1)
Sodium: 138 mEq/L (ref 135–145)
Total Bilirubin: 0.3 mg/dL (ref 0.3–1.2)
Total Protein: 7 g/dL (ref 6.0–8.3)

## 2011-03-12 LAB — RETICULOCYTES: Retic Ct Pct: 1.6 % (ref 0.4–3.1)

## 2011-03-12 LAB — BLOOD GAS, ARTERIAL
Acid-base deficit: 1.9 mmol/L (ref 0.0–2.0)
Bicarbonate: 22.2 mEq/L (ref 20.0–24.0)
O2 Saturation: 92.4 %
Patient temperature: 37
pO2, Arterial: 64.9 mmHg — ABNORMAL LOW (ref 80.0–100.0)

## 2011-03-12 NOTE — Progress Notes (Signed)
This office note has been dictated.

## 2011-03-12 NOTE — Patient Instructions (Signed)
[facility]  Discharge Instructions  RECOMMENDATIONS MADE BY THE CONSULTANT AND ANY TEST RESULTS WILL BE SENT TO YOUR REFERRING DOCTOR.        SPECIAL INSTRUCTIONS/FOLLOW-UP:  Labwork and ABG today. We will schedule a CT Scan as well. We will see you back in a couple of weeks to review scan results.   I acknowledge that I have been informed and understand all the instructions given to me and received a copy. I do not have any more questions at this time, but understand that I may call the [facility] at [facility] at [phone] during business hours should I have any further questions or need assistance in obtaining follow-up care.    __________________________________________  _____________  __________ Signature of Patient or Authorized Representative            Date                   Time    __________________________________________ Nurse's Signature

## 2011-03-12 NOTE — Progress Notes (Signed)
REFERRING PHYSICIAN:  Francis P. Modesto Charon, M.D.  DIAGNOSES: 1. 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 3. History of atrial fibrillation in the past for which he sees Dr.     Dietrich Pates. 4. History of hyperlipidemia. 5. History of gastroesophageal reflux disease. 6. History of atherosclerotic cardiovascular disease. 7. History of borderline diabetes.  BRIEF HISTORY:  This gentleman is referred for the above-mentioned finding of an elevated hemoglobin and hematocrit.  His most recent value we have is a hemoglobin 18.2 g, hematocrit 52.4% with a normal white count and normal platelet count and unremarkable differential.  Prior to that in June his hemoglobin was 18.2 g and hematocrit 53%.  His white count and platelets and difference were once again normal at that time.  His other problems are mentioned above.  His wife accompanied him today. He has 3 children with her and a son from a prior marriage many years ago.  He has no known allergies that he is aware of.  In spite of his disability which he was granted last fall 2011, he continues to smoke at least a pack a day.  His wife is also a smoker of a pack a day.  They have been married 30 years.  He is disabled.  He used to be a pipe fitter.  Parents are both alive and in fair health.  ONCOLOGIC REVIEW OF SYSTEMS:  He is not coughing up blood.  He admits to shortness of breath.  He can only walk a very short distance without getting short-winded.  He has not had fever, chills or night sweats that are documented.  He has actually gained weight in the last 6 months.  He has no blood in his urine.  He actually had a colonoscopy in May 2012 at [facility] by a gastroenterologist from [place] and his prostate was checked at that time and was said to be unremarkable, he states.  His colon was clean as a whistle he states and his next colonoscopy is due in 10 years.  He does  not have oxygen at home.  He does not use a wheelchair, does not use any assistance in walking.  He is not sure how for he can walk but it is a very short distance according his wife and him.  PHYSICAL EXAMINATION:  His physical exam shows that he is 138 pounds, height 5 feet 8 inches, blood pressure 118/76.  His pulse is 88 and is very regular presently.  Respirations 18-20 and unlabored.  Temperature is normal.  He has obvious clubbing of both hands.  His age is 10 but he looks much older than his stated age.  His lymph node exam is negative throughout.  His lungs show hyperresonance to percussion and he has markedly diminished breath sounds throughout.  He has no rales or rubs. His heart shows a regular rhythm, very distant heart sounds.  No obvious murmur or gallop.  Breast exam is negative for any gynecomastia.  His abdomen shows no obvious hepatosplenomegaly.  He does have vitiligo.  He has no adenopathy in any location.  He has no peripheral edema.  Pulses are symmetrical.  He is left handed.  His dorsalis pedis pulses are essentially absent.  Posterior tibialis pulses are 1+ to 2+.  He is edentulous.  Tongue is unremarkable.  Pupils appear equally round, reactive to light.  His clubbing is very prominent bilaterally in all fingers.  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 I think we also need to look at his kidneys at the same time.  We will get a urinalysis and arterial blood gases today, erythropoietin level, CBC and CMET to look at his kidney function.  See him back in a few weeks.  If this is all secondary we will encourage him once again to quit smoking which he absolutely should do and we will phlebotomize him down to a hemoglobin of 15 g to decrease his risk of stroke or heart disease going forward due to the thickness of his  blood.    ______________________________ Ladona Horns. Mariel Sleet, MD ESN/MEDQ  D:  03/12/2011  T:  03/12/2011  Job:  [id number]

## 2011-03-18 ENCOUNTER — Emergency Department (HOSPITAL_COMMUNITY)
Admission: EM | Admit: 2011-03-18 | Discharge: 2011-03-18 | Disposition: A | Discharge status: Home / Self Care | Payer: Medicare Other | Attending: Emergency Medicine | Admitting: Emergency Medicine

## 2011-03-18 ENCOUNTER — Other Ambulatory Visit: Payer: Self-pay

## 2011-03-18 ENCOUNTER — Emergency Department (HOSPITAL_COMMUNITY): Payer: Medicare Other

## 2011-03-18 ENCOUNTER — Encounter (HOSPITAL_COMMUNITY): Payer: Self-pay

## 2011-03-18 DIAGNOSIS — J438 Other emphysema: Secondary | ICD-10-CM | POA: Insufficient documentation

## 2011-03-18 DIAGNOSIS — F17200 Nicotine dependence, unspecified, uncomplicated: Secondary | ICD-10-CM | POA: Insufficient documentation

## 2011-03-18 DIAGNOSIS — Z9861 Coronary angioplasty status: Secondary | ICD-10-CM | POA: Insufficient documentation

## 2011-03-18 DIAGNOSIS — R079 Chest pain, unspecified: Secondary | ICD-10-CM

## 2011-03-18 LAB — POCT I-STAT, CHEM 8
BUN: 15 mg/dL (ref 6–23)
Creatinine, Ser: 1.2 mg/dL (ref 0.50–1.35)
Glucose, Bld: 116 mg/dL — ABNORMAL HIGH (ref 70–99)
Hemoglobin: 17 g/dL (ref 13.0–17.0)
Potassium: 3.8 mEq/L (ref 3.5–5.1)

## 2011-03-18 LAB — CBC
HCT: 47.3 % (ref 39.0–52.0)
Hemoglobin: 16.9 g/dL (ref 13.0–17.0)
MCHC: 35.7 g/dL (ref 30.0–36.0)
WBC: 10.5 10*3/uL (ref 4.0–10.5)

## 2011-03-18 LAB — DIFFERENTIAL
Basophils Absolute: 0.1 10*3/uL (ref 0.0–0.1)
Basophils Relative: 1 % (ref 0–1)
Lymphocytes Relative: 36 % (ref 12–46)
Monocytes Relative: 8 % (ref 3–12)
Neutro Abs: 5.5 10*3/uL (ref 1.7–7.7)
Neutrophils Relative %: 52 % (ref 43–77)

## 2011-03-18 LAB — PROTIME-INR
INR: 1.03 (ref 0.00–1.49)
Prothrombin Time: 13.7 seconds (ref 11.6–15.2)

## 2011-03-18 MED ORDER — KETOROLAC TROMETHAMINE 60 MG/2ML IM SOLN
60.0000 mg | Freq: Once | INTRAMUSCULAR | Status: AC
  Administered 2011-03-18: 60 mg via INTRAMUSCULAR
  Filled 2011-03-18: qty 2

## 2011-03-18 MED ORDER — ASPIRIN 81 MG PO CHEW
324.0000 mg | CHEWABLE_TABLET | Freq: Once | ORAL | Status: AC
  Administered 2011-03-18: 324 mg via ORAL
  Filled 2011-03-18: qty 4

## 2011-03-18 NOTE — ED Notes (Signed)
Pt reports cp daily for one month, woke him from sleep tonight w. The pain, mid sternal, reports having the pain daily for 6 weeks.  Denies any sob, no cold/cough, no runny nose.  Has been seen by pmd for same-1 month ago.

## 2011-03-18 NOTE — ED Provider Notes (Signed)
History     CSN: [id number] Arrival date & time: 03/18/2011  3:34 AM  Chief Complaint  Patient presents with  . Chest Pain   HPI Comments: Patient is a pleasant 50 year old male who presents with his family member with complaint of left-sided chest pain. This is a problem that he has been experiencing for the last 6 weeks. It seems to come and go intermittently, lasting several seconds when it gets, a sharp, left-sided, no radiation to the shoulders, jaw, arms. He has chronic shortness of breath and low oxygen related to his COPD. He has had several cardiac investigations in the past including a cardiac catheterization in 2007 showing mild nonobstructive coronary arteries, stress test in 2009 showing no ischemic changes. He does have a history of paroxysmal atrial fibrillation but does not feel that he has had this problem recently. He denies fever, chills, nausea, vomiting, shortness of breath, cough, back pain, abdominal pain, swelling, rash, dysuria, diarrhea or other significant symptoms. He has not taken any medications prior to arrival for his pain. He doesn't admit that he has seen his family doctor for this recently and they told him to come to the ER should he develop this chest pain has been going to the office. He has an existing relationship with Dr. Dietrich Pates, local cardiologist.  Patient is a 50 y.o. male presenting with chest pain. The history is provided by the patient, a relative and medical records.  Chest Pain     Past Medical History  Diagnosis Date  . Emphysema   . COPD (chronic obstructive pulmonary disease)   . Hypercholesteremia   . Seizures   . Pneumonia   . Polycythemia     Past Surgical History  Procedure Date  . Angioplasty   . Colonoscopy w/ endoscopic [place]   . Vasectomy   . Throat biopsy     Family History  Problem Relation Age of Onset  . Hypertension Mother   . Diabetes Mother   . Heart attack Mother   . Hypertension Sister   . Diabetes Sister      History  Substance Use Topics  . Smoking status: Current Everyday Smoker -- 1.5 packs/day    Types: Cigarettes  . Smokeless tobacco: Never Used  . Alcohol Use: No      Review of Systems  Cardiovascular: Positive for chest pain.  All other systems reviewed and are negative.    Physical Exam  BP 123/79  Pulse 69  Temp(Src) 97.9 F (36.6 C) (Oral)  Resp 20  Ht 5\' 11"  (1.803 m)  Wt 135 lb (61.236 kg)  BMI 18.83 kg/m2  SpO2 95%  Physical Exam  Nursing note and vitals reviewed. Constitutional: He appears well-developed and well-nourished. No distress.  HENT:  Head: Normocephalic and atraumatic.  Mouth/Throat: Oropharynx is clear and moist. No oropharyngeal exudate.  Eyes: Conjunctivae and EOM are normal. Pupils are equal, round, and reactive to light. Right eye exhibits no discharge. Left eye exhibits no discharge. No scleral icterus.  Neck: Normal range of motion. Neck supple. No JVD present. No thyromegaly present.  Cardiovascular: Normal rate, regular rhythm, normal heart sounds and intact distal pulses.  Exam reveals no gallop and no friction rub.   No murmur heard. Pulmonary/Chest: Effort normal and breath sounds normal. No respiratory distress. He has no wheezes. He has no rales. He exhibits tenderness (bilateral lower rib anterior tenderness to palpation without crepitus, subcutaneous emphysema or rashes.).  Abdominal: Soft. Bowel sounds are normal. He exhibits no distension and  no mass. There is no tenderness.  Musculoskeletal: Normal range of motion. He exhibits no edema and no tenderness.  Lymphadenopathy:    He has no cervical adenopathy.  Neurological: He is alert. Coordination normal.  Skin: Skin is warm and dry. No rash noted. No erythema.  Psychiatric: He has a normal mood and affect. His behavior is normal.    ED Course  Procedures  MDM EKG is normal with no signs of ischemia, this is during chest pain. He has some reproducible chest pain to  palpation though this is discretely different than the sharp shooting pains he has in the left side of his chest. He does admit that this left-sided chest pain is somewhat worse with deep breathing. His oxygen level is 95% on room air without any labored breathing. This is similar to the oxygen saturations noted on prior cardiac evaluations. His other vital signs are very normal with a pulse of 69 and a blood pressure of 123/79. I will check one set of blood work to rule out acute coronary syndrome as he has been having this pain for 6 weeks at least.  Filed Vitals:   03/18/11 0337  BP: 123/79  Pulse: 69  Temp: 97.9 F (36.6 C)  Resp: 20    ED ECG REPORT   Date: 03/18/2011 3:38 AM  Rate: 74   Rhythm: normal sinus rhythm  QRS Axis: normal  Intervals: normal  ST/T Wave abnormalities: normal  Conduction Disutrbances:none  Narrative Interpretation:   Old EKG Reviewed: unchanged from 10/24/10  Results for orders placed during the hospital encounter of 03/18/11  CBC      Component Value Range   WBC 10.5  4.0 - 10.5 (K/uL)   RBC 5.38  4.22 - 5.81 (MIL/uL)   Hemoglobin 16.9  13.0 - 17.0 (g/dL)   HCT 45.4  09.8 - 11.9 (%)   MCV 87.9  78.0 - 100.0 (fL)   MCH 31.4  26.0 - 34.0 (pg)   MCHC 35.7  30.0 - 36.0 (g/dL)   RDW 14.7  82.9 - 56.2 (%)   Platelets 188  150 - 400 (K/uL)  DIFFERENTIAL      Component Value Range   Neutrophils Relative 52  43 - 77 (%)   Neutro Abs 5.5  1.7 - 7.7 (K/uL)   Lymphocytes Relative 36  12 - 46 (%)   Lymphs Abs 3.8  0.7 - 4.0 (K/uL)   Monocytes Relative 8  3 - 12 (%)   Monocytes Absolute 0.9  0.1 - 1.0 (K/uL)   Eosinophils Relative 3  0 - 5 (%)   Eosinophils Absolute 0.3  0.0 - 0.7 (K/uL)   Basophils Relative 1  0 - 1 (%)   Basophils Absolute 0.1  0.0 - 0.1 (K/uL)  APTT      Component Value Range   aPTT 32  24 - 37 (seconds)  PROTIME-INR      Component Value Range   Prothrombin Time 13.7  11.6 - 15.2 (seconds)   INR 1.03  0.00 - 1.49   POCT I-STAT,  CHEM 8      Component Value Range   Sodium 138  135 - 145 (mEq/L)   Potassium 3.8  3.5 - 5.1 (mEq/L)   Chloride 104  96 - 112 (mEq/L)   BUN 15  6 - 23 (mg/dL)   Creatinine, Ser 1.30  0.50 - 1.35 (mg/dL)   Glucose, Bld 865 (*) 70 - 99 (mg/dL)   Calcium, Ion 7.84  6.96 -  1.32 (mmol/L)   TCO2 22  0 - 100 (mmol/L)   Hemoglobin 17.0  13.0 - 17.0 (g/dL)   HCT 16.1  09.6 - 04.5 (%)  POCT I-STAT TROPONIN I      Component Value Range   Troponin i, poc 0.00  0.00 - 0.08 (ng/mL)   Comment 3            Patient reevaluated and informed of his chest x-ray results including the pulmonary nodule. He states that he will be getting a CT scan of his chest on Wednesday of this week as an evaluation for his elevated red blood counts as ordered by his oncologist.  His pain is very atypical for coronary syndrome and given his normal lab tests in light of 6 weeks of pain, normal EKG, normal workup in the past, will defer further cardiac testing at this time. I have encouraged him to followup with his cardiologist again for repeat stress tests.   Vida Roller, MD 03/18/11 [phone]

## 2011-03-18 NOTE — ED Notes (Signed)
Patient placed on O2 via [place] at 2L/min.

## 2011-03-18 NOTE — ED Notes (Signed)
Patient states pain hurts more when he coughs.

## 2011-03-19 ENCOUNTER — Encounter (HOSPITAL_COMMUNITY): Payer: Medicare Other | Attending: Oncology | Admitting: Oncology

## 2011-03-19 ENCOUNTER — Ambulatory Visit (HOSPITAL_COMMUNITY)
Admission: RE | Admit: 2011-03-19 | Discharge: 2011-03-19 | Disposition: A | Discharge status: Home / Self Care | Payer: Medicare Other | Source: Ambulatory Visit | Attending: Oncology | Admitting: Oncology

## 2011-03-19 ENCOUNTER — Other Ambulatory Visit (HOSPITAL_COMMUNITY): Payer: Self-pay | Admitting: Oncology

## 2011-03-19 DIAGNOSIS — R109 Unspecified abdominal pain: Secondary | ICD-10-CM | POA: Insufficient documentation

## 2011-03-19 DIAGNOSIS — Z79899 Other long term (current) drug therapy: Secondary | ICD-10-CM | POA: Insufficient documentation

## 2011-03-19 DIAGNOSIS — R911 Solitary pulmonary nodule: Secondary | ICD-10-CM

## 2011-03-19 DIAGNOSIS — E7800 Pure hypercholesterolemia, unspecified: Secondary | ICD-10-CM | POA: Insufficient documentation

## 2011-03-19 DIAGNOSIS — D751 Secondary polycythemia: Secondary | ICD-10-CM

## 2011-03-19 DIAGNOSIS — F411 Generalized anxiety disorder: Secondary | ICD-10-CM | POA: Insufficient documentation

## 2011-03-19 DIAGNOSIS — R110 Nausea: Secondary | ICD-10-CM | POA: Insufficient documentation

## 2011-03-19 DIAGNOSIS — J438 Other emphysema: Secondary | ICD-10-CM

## 2011-03-19 DIAGNOSIS — R918 Other nonspecific abnormal finding of lung field: Secondary | ICD-10-CM | POA: Insufficient documentation

## 2011-03-19 DIAGNOSIS — J984 Other disorders of lung: Secondary | ICD-10-CM | POA: Insufficient documentation

## 2011-03-19 MED ORDER — IOHEXOL 300 MG/ML  SOLN
100.0000 mL | Freq: Once | INTRAMUSCULAR | Status: AC | PRN
  Administered 2011-03-19: 100 mL via INTRAVENOUS

## 2011-03-19 NOTE — Progress Notes (Signed)
This office note has been dictated.

## 2011-03-19 NOTE — Progress Notes (Signed)
CC:   Ryan [name], M.D.  DIAGNOSES: 1. 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 2. Left mid lung lesion consistent with an abscess or a lung primary. 3. Severe emphysema. 4. History of atrial fibrillation in the past. 5. History of hyperlipidemia. 6. History of gastroesophageal reflux disease. 7. Atherosclerotic cardiovascular disease. 8. History of borderline diabetes.  Ryan [name]'s laboratory studies thus far point towards secondary erythrocytosis with a very low pO2 of less than 65 on room air.  His erythropoietin level is well within the normal range, however.  His CT scans, other labs, and urinalysis are unremarkable except for a CT scan of the chest which shows a left mid lung lesion that is about 2 cm, thick walled, and therefore with high pickup PET material.  So it is cavitary and may be just an abscess.  He of course has history of chronic bronchitis, emphysema, and therefore we are going to treat him with Augmentin for 28 days and repeat his CT scan in 8 weeks to see if this thing has gone away.  With his severe emphysema he may not be a surgical candidate.  So I do not think it makes a difference as to whether we do a PET scan at this juncture.  His arterial blood gases really show horrendous oxygenation.  So I have asked him to quit smoking, take the Augmentin 875 mg b.i.d. for 28 days, and see [place] back with a repeat CT of the chest in 8 weeks. If it is still is persistent then we will get a consultation with Dr. Edwyna Shell or Dr. Tyrone Sage for consideration of bronchoscopy and cytology, washings, etc.  I showed he and his wife these films.  They understand what we are up against and he is going to take the antibiotics.  Hopefully, he is willing to quit smoking but I am not sure he will do that.  We will see him in 8 weeks, sooner if need be.    ______________________________ Ladona Horns. Mariel Sleet, MD ESN/MEDQ  D:  03/19/2011  T:  03/19/2011  Job:  [id number]

## 2011-03-19 NOTE — Patient Instructions (Signed)
[facility]  Discharge Instructions  RECOMMENDATIONS MADE BY THE CONSULTANT AND ANY TEST RESULTS WILL BE SENT TO YOUR REFERRING DOCTOR.   EXAM FINDINGS BY MD TODAY AND SIGNS AND SYMPTOMS TO REPORT TO CLINIC OR PRIMARY MD: CT scans reviewed. This is more than likely an abscess but we need to follow this. Your lungs look really bad. MEDICATIONS PRESCRIBED: augmentin 875 mg twice a day for 28 days. Follow label directions  INSTRUCTIONS GIVEN AND DISCUSSED: Take these antibiotics and stop smoking! Phlebotomy weekly times 3  SPECIAL INSTRUCTIONS/FOLLOW-UP: Lab work Needed : cbc in 8 weeks and ct  Of chest in 8 weeks.   I acknowledge that I have been informed and understand all the instructions given to me and received a copy. I do not have any more questions at this time, but understand that I may call the [facility]ty [facility] at [facility] at [phone] during business hours should I have any further questions or need assistance in obtaining follow-up care.    __________________________________________  _____________  __________ Signature of Patient or Authorized Representative            Date                   Time    __________________________________________ Nurse's Signature

## 2011-03-21 ENCOUNTER — Telehealth (HOSPITAL_COMMUNITY): Payer: Self-pay

## 2011-03-21 ENCOUNTER — Other Ambulatory Visit (HOSPITAL_COMMUNITY): Payer: Self-pay | Admitting: Oncology

## 2011-03-21 MED ORDER — MOXIFLOXACIN HCL 400 MG PO TABS: 400.0000 mg | ORAL_TABLET | Freq: Every day | ORAL | Status: AC

## 2011-03-21 NOTE — Telephone Encounter (Signed)
Complaining with abdominal discomfort and diarrhea since yesterday.  Has had approximately 5 semi-liquid light brown stools.  Was placed on Augmentin 875 mg bid on 9/5 by Dr. Mariel Sleet.  Took 1 on 9/5 and 1 bid on 9/6.  Has not taken any today.  States that Dr. Mariel Sleet told him to call with any GI problems.

## 2011-03-21 NOTE — Telephone Encounter (Signed)
Discussed abdominal discomfort and diarrhea with PA.  Rx for Avelox sent electronically to [facility] in [place].  Patient notified to stop Augmentin and to take Avelox as prescribed.

## 2011-03-26 ENCOUNTER — Encounter (HOSPITAL_COMMUNITY): Payer: Medicare Other

## 2011-03-26 VITALS — BP 114/76 | HR 69 | Temp 98.3°F

## 2011-03-26 DIAGNOSIS — F419 Anxiety disorder, unspecified: Secondary | ICD-10-CM

## 2011-03-26 DIAGNOSIS — D751 Secondary polycythemia: Secondary | ICD-10-CM

## 2011-03-26 DIAGNOSIS — R110 Nausea: Secondary | ICD-10-CM

## 2011-03-26 MED ORDER — LORAZEPAM 0.5 MG PO TABS: ORAL_TABLET | ORAL | Status: DC

## 2011-03-26 MED ORDER — LORAZEPAM 0.5 MG PO TABS: 0.5000 mg | ORAL_TABLET | Freq: Three times a day (TID) | ORAL | Status: DC

## 2011-03-26 NOTE — Progress Notes (Unsigned)
Patient tolerated phlebotomy fairly. Patient got nauseated, flushed, clammy, sweating. Had to fan patient and apply ice. Patients vitals are stable. Ativan 0.5mg  prescribed for patient to take 1 hour prior to phlebotomy due to above s/s. We were only able to obtain approximately 300cc of blood b/c the tubing clotted off. Blood flow was fairly slow and thick. Next phlebotomy scheduled for next Wednesday.

## 2011-04-01 ENCOUNTER — Ambulatory Visit (HOSPITAL_COMMUNITY): Payer: Medicare Other | Admitting: Oncology

## 2011-04-02 ENCOUNTER — Encounter (HOSPITAL_COMMUNITY): Payer: Medicare Other

## 2011-04-02 VITALS — BP 117/75 | HR 105

## 2011-04-02 DIAGNOSIS — D751 Secondary polycythemia: Secondary | ICD-10-CM

## 2011-04-02 NOTE — Progress Notes (Signed)
Addended by: Oda Kilts on: 04/02/2011 02:47 PM   Modules accepted: Orders

## 2011-04-02 NOTE — Progress Notes (Signed)
Addended by: Oda Kilts on: 04/02/2011 03:05 PM   Modules accepted: Orders

## 2011-04-02 NOTE — Progress Notes (Signed)
vp x 1 to rt ac for phlebotomy. 500cc of blood removed. Patient tolerated well today. IV site wnl.

## 2011-04-03 ENCOUNTER — Ambulatory Visit (HOSPITAL_COMMUNITY): Payer: Medicare Other | Admitting: Oncology

## 2011-04-03 IMAGING — CT CT ANGIO CHEST
1 of 6 series · 5 of 36 positions shown · IV contrast (Omnipaque 300)
Comparison: Chest x-ray dated 01/14/2010 and CT scan of the chest
dated 01/12/2008

CLINICAL DATA: Shortness of breath and chest pain.  Cough.
History of COPD.

CT ANGIOGRAPHY CHEST WITH CONTRAST
TECHNIQUE: Multidetector CT imaging of the chest was performed
using the standard protocol during bolus administration of
intravenous contrast.  Multiplanar CT image reconstructions
including MIPs were obtained to evaluate the vascular anatomy.
Contrast:  100 ccs Jmnipaque-TOO

[Series 6: pe 3.0 b40f · axial · 0.59mm/px · z∈[-244,-38]mm · 5 of 105 slices shown]
[im 18/105  lung]
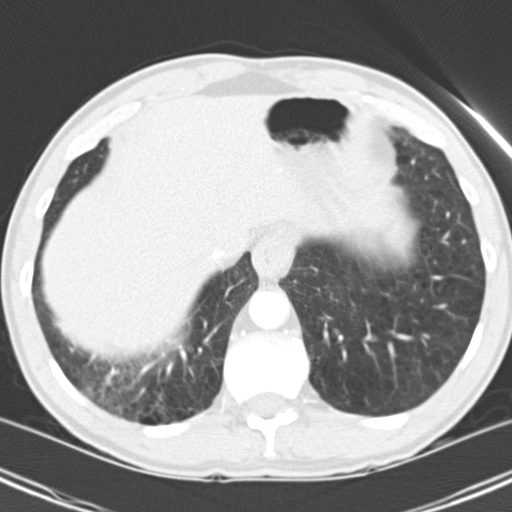
[im 35/105  mediastinal]
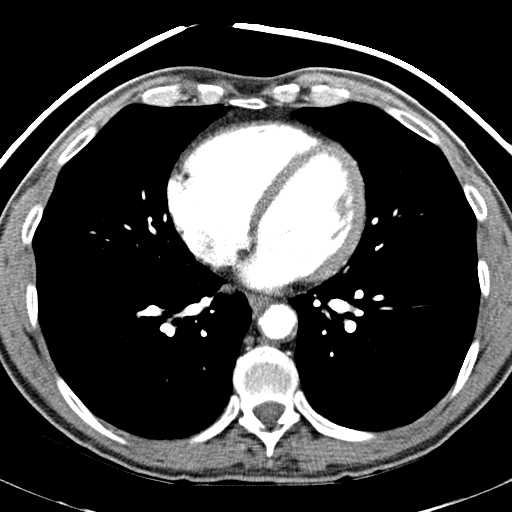
[im 53/105  lung]
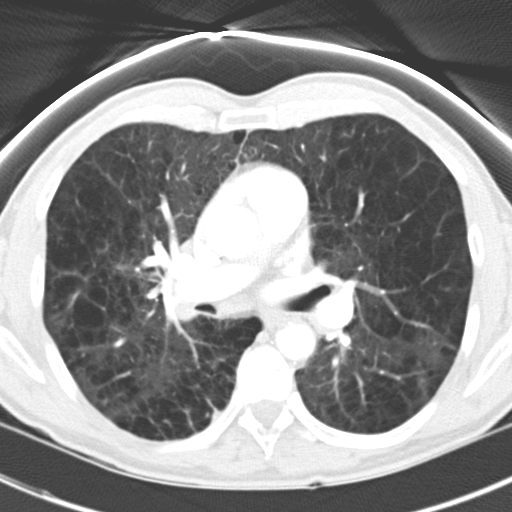
[im 70/105  mediastinal]
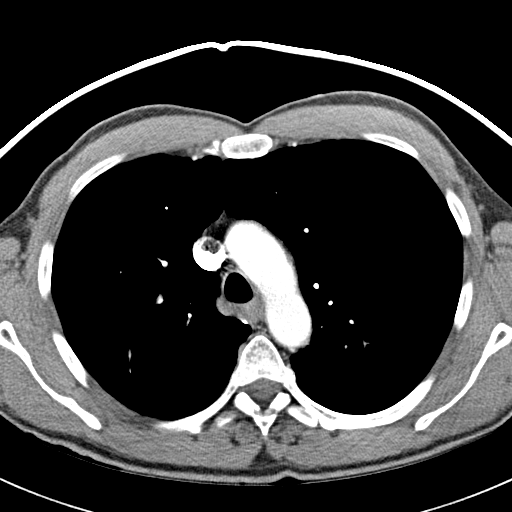
[im 87/105  lung]
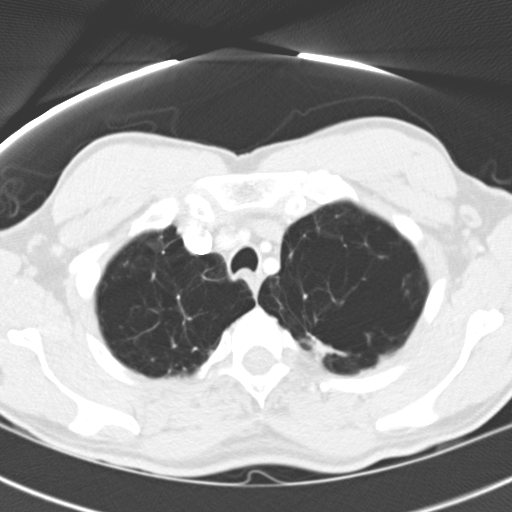

[5 of 36 positions shown; findings below may reference images not displayed]

FINDINGS: There are no pulmonary emboli.  The patient has severe
chronic obstructive lung disease as well as chronic interstitial
lung disease particularly at the right base posteriorly.

There are no consolidative infiltrates or effusions.  There is
chronic mild hilar mediastinal adenopathy which is unchanged.
Heart size is normal.  No bony abnormalities of significance.

Review of the MIP images confirms the above findings.
IMPRESSION: 1.  No pulmonary emboli.
2.  Severe chronic interstitial and obstructive lung disease.
3.  No acute abnormalities.  Interstitial disease is most prominent
at the right base posteriorly, unchanged since 01/12/2008.

## 2011-04-08 ENCOUNTER — Encounter (HOSPITAL_COMMUNITY): Payer: Medicare Other

## 2011-04-09 ENCOUNTER — Encounter (HOSPITAL_COMMUNITY): Payer: Medicare Other

## 2011-04-10 LAB — DIFFERENTIAL
Basophils Relative: 0
Basophils Relative: 0
Eosinophils Relative: 1
Lymphocytes Relative: 24
Lymphocytes Relative: 33
Lymphocytes Relative: 24
Lymphs Abs: 1.9
Lymphs Abs: 2.5
Monocytes Absolute: 0.5
Monocytes Absolute: 0.6
Monocytes Relative: 7
Monocytes Relative: 9
Monocytes Relative: 6
Neutro Abs: 4.2
Neutro Abs: 5.3
Neutro Abs: 7.4
Neutrophils Relative %: 57
Neutrophils Relative %: 66

## 2011-04-10 LAB — CBC
HCT: 46.1
HCT: 44.4
Hemoglobin: 15.6
Hemoglobin: 15.4
MCHC: 36
MCHC: 36.2 — ABNORMAL HIGH
MCHC: 34.8
MCV: 90.1
Platelets: 183
RBC: 4.77
RBC: 4.92
RDW: 12.8
WBC: 7.4
WBC: 8.1

## 2011-04-10 LAB — BLOOD GAS, ARTERIAL
Acid-base deficit: 2.9 — ABNORMAL HIGH
Bicarbonate: 21.2
O2 Saturation: 92.4
Patient temperature: 37
TCO2: 18.2
pH, Arterial: 7.394

## 2011-04-10 LAB — BASIC METABOLIC PANEL
CO2: 25
CO2: 24
Calcium: 8.2 — ABNORMAL LOW
Calcium: 8.8
Chloride: 109
Creatinine, Ser: 1.02
GFR calc Af Amer: >60
Glucose, Bld: 95
Glucose, Bld: 165 — ABNORMAL HIGH
Potassium: 4
Sodium: 138
Sodium: 134 — ABNORMAL LOW

## 2011-04-10 LAB — COMPREHENSIVE METABOLIC PANEL
AST: 20
Albumin: 3.4 — ABNORMAL LOW
BUN: 12
Calcium: 8.8
Creatinine, Ser: 1.11
GFR calc Af Amer: >60
Total Protein: 6.1

## 2011-04-10 LAB — RAPID URINE DRUG SCREEN, HOSP PERFORMED
Barbiturates: NOT DETECTED
Benzodiazepines: NOT DETECTED
Cocaine: NOT DETECTED

## 2011-04-10 LAB — CARDIAC PANEL(CRET KIN+CKTOT+MB+TROPI)
CK, MB: 1.9
Relative Index: INVALID
Total CK: 79
Total CK: 86

## 2011-04-10 LAB — POCT CARDIAC MARKERS
CKMB, poc: 1.9
CKMB, poc: <1 — ABNORMAL LOW
Myoglobin, poc: 59.2
Myoglobin, poc: 73.8
Operator id: 216461
Operator id: 216461
Troponin i, poc: <0.05

## 2011-04-10 LAB — LIPID PANEL
Cholesterol: 147
HDL: 30 — ABNORMAL LOW
LDL Cholesterol: 101 — ABNORMAL HIGH
Total CHOL/HDL Ratio: 4.9
Triglycerides: 79
VLDL: 16

## 2011-04-10 LAB — PROTIME-INR: Prothrombin Time: 13.2

## 2011-04-10 LAB — T4, FREE: Free T4: 1.29

## 2011-04-10 LAB — B-NATRIURETIC PEPTIDE (CONVERTED LAB): Pro B Natriuretic peptide (BNP): 56.9

## 2011-04-10 LAB — MAGNESIUM: Magnesium: 2.2

## 2011-04-10 LAB — D-DIMER, QUANTITATIVE: D-Dimer, Quant: 0.22

## 2011-04-11 LAB — DIFFERENTIAL
Basophils Absolute: 0
Lymphocytes Relative: 22
Lymphs Abs: 2
Monocytes Absolute: 0.6
Monocytes Relative: 7
Neutro Abs: 6.5

## 2011-04-11 LAB — BASIC METABOLIC PANEL
Calcium: 8.6
GFR calc Af Amer: >60
GFR calc non Af Amer: >60
Potassium: 3.7
Sodium: 138

## 2011-04-11 LAB — CBC
Hemoglobin: 16.5
RBC: 5.34
WBC: 9.2

## 2011-04-16 ENCOUNTER — Encounter (HOSPITAL_COMMUNITY): Payer: Medicare Other | Attending: Oncology

## 2011-04-16 VITALS — BP 113/82 | HR 103 | Temp 98.4°F

## 2011-04-16 DIAGNOSIS — D751 Secondary polycythemia: Secondary | ICD-10-CM | POA: Insufficient documentation

## 2011-04-16 NOTE — Progress Notes (Signed)
  Phlebotomy 500cc blood removed. Started at 1005 and ended at 23. Pt tolerated well.

## 2011-04-24 ENCOUNTER — Emergency Department (HOSPITAL_COMMUNITY)
Admission: EM | Admit: 2011-04-24 | Discharge: 2011-04-24 | Disposition: A | Discharge status: Home / Self Care | Payer: Medicare Other | Attending: Emergency Medicine | Admitting: Emergency Medicine

## 2011-04-24 ENCOUNTER — Encounter (HOSPITAL_COMMUNITY): Payer: Self-pay | Admitting: *Deleted

## 2011-04-24 DIAGNOSIS — J438 Other emphysema: Secondary | ICD-10-CM | POA: Insufficient documentation

## 2011-04-24 DIAGNOSIS — M791 Myalgia, unspecified site: Secondary | ICD-10-CM

## 2011-04-24 DIAGNOSIS — R51 Headache: Secondary | ICD-10-CM

## 2011-04-24 DIAGNOSIS — IMO0001 Reserved for inherently not codable concepts without codable children: Secondary | ICD-10-CM | POA: Insufficient documentation

## 2011-04-24 DIAGNOSIS — M79603 Pain in arm, unspecified: Secondary | ICD-10-CM

## 2011-04-24 DIAGNOSIS — M79609 Pain in unspecified limb: Secondary | ICD-10-CM | POA: Insufficient documentation

## 2011-04-24 DIAGNOSIS — R569 Unspecified convulsions: Secondary | ICD-10-CM | POA: Insufficient documentation

## 2011-04-24 DIAGNOSIS — Z87891 Personal history of nicotine dependence: Secondary | ICD-10-CM | POA: Insufficient documentation

## 2011-04-24 DIAGNOSIS — E7800 Pure hypercholesterolemia, unspecified: Secondary | ICD-10-CM | POA: Insufficient documentation

## 2011-04-24 DIAGNOSIS — I2510 Atherosclerotic heart disease of native coronary artery without angina pectoris: Secondary | ICD-10-CM | POA: Insufficient documentation

## 2011-04-24 MED ORDER — ONDANSETRON HCL 4 MG/2ML IJ SOLN
4.0000 mg | Freq: Once | INTRAMUSCULAR | Status: AC
  Administered 2011-04-24: 4 mg via INTRAVENOUS
  Filled 2011-04-24: qty 2

## 2011-04-24 MED ORDER — PANTOPRAZOLE SODIUM 40 MG IV SOLR
40.0000 mg | Freq: Once | INTRAVENOUS | Status: AC
  Administered 2011-04-24: 40 mg via INTRAVENOUS
  Filled 2011-04-24: qty 40

## 2011-04-24 MED ORDER — HYDROMORPHONE HCL 1 MG/ML IJ SOLN
1.0000 mg | Freq: Once | INTRAMUSCULAR | Status: AC
  Administered 2011-04-24: 1 mg via INTRAVENOUS
  Filled 2011-04-24: qty 1

## 2011-04-24 MED ORDER — SODIUM CHLORIDE 0.9 % IV SOLN
Freq: Once | INTRAVENOUS | Status: AC
  Administered 2011-04-24: 05:00:00 via INTRAVENOUS

## 2011-04-24 MED ORDER — IPRATROPIUM BROMIDE 0.02 % IN SOLN
0.5000 mg | Freq: Once | RESPIRATORY_TRACT | Status: AC
  Administered 2011-04-24: 0.5 mg via RESPIRATORY_TRACT
  Filled 2011-04-24: qty 2.5

## 2011-04-24 MED ORDER — SODIUM CHLORIDE 0.9 % IV BOLUS (SEPSIS)
500.0000 mL | Freq: Once | INTRAVENOUS | Status: AC
  Administered 2011-04-24: 500 mL via INTRAVENOUS

## 2011-04-24 MED ORDER — ALBUTEROL SULFATE (5 MG/ML) 0.5% IN NEBU
2.5000 mg | INHALATION_SOLUTION | Freq: Once | RESPIRATORY_TRACT | Status: AC
  Administered 2011-04-24: 2.5 mg via RESPIRATORY_TRACT
  Filled 2011-04-24: qty 0.5

## 2011-04-24 MED ORDER — IBUPROFEN 800 MG PO TABS
800.0000 mg | ORAL_TABLET | Freq: Once | ORAL | Status: AC
  Administered 2011-04-24: 800 mg via ORAL
  Filled 2011-04-24: qty 1

## 2011-04-24 NOTE — ED Notes (Signed)
Pt pt states he is feeling much better at this time, pt denies any pain & his breathing is batter also.

## 2011-04-24 NOTE — ED Notes (Addendum)
Pt reports receiving flu & pneumonia vaccine yesterday. Around 1700 pt states he started itching on his neck & between shoulder blades. Pt also complains of ha, back & neck hurting. Denies any sob. Pt denies taking any meds before coming to the er.

## 2011-04-24 NOTE — ED Notes (Signed)
Patient states that he received a flu and pneumonia shot yesterday, now c/o neck and arm pain with itching, c/o mild CP and HA, chills and body aches as well

## 2011-04-24 NOTE — ED Provider Notes (Signed)
History     CSN: [id number] Arrival date & time: 04/24/2011  4:00 AM  Chief Complaint  Patient presents with  . Medication Reaction    (Consider location/radiation/quality/duration/timing/severity/associated sxs/prior treatment) HPI Comments: Seen [phone] Patient with flue shot and pneumonia shot yesterday afternoon. Now with myalgias, headache, and sore arms. States that both injection sites itch. Denies fever, chills, nausea, vomiting. Recently diagnosed with lung nodule and under evaluation for lung cancer.   Patient is a 50 y.o. male presenting with extremity pain. The history is provided by the patient (Patient had both his flu shot and pneumonia shot yesterday. Now with both arms sore., body aches, itching. headache).  Extremity Pain This is a new problem. The current episode started yesterday. The problem occurs constantly. The problem has not changed since onset.Associated symptoms include headaches. Associated symptoms comments: myalgias. The symptoms are aggravated by nothing. The symptoms are relieved by nothing. He has tried nothing for the symptoms.    Past Medical History  Diagnosis Date  . Emphysema   . COPD (chronic obstructive pulmonary disease)   . Hypercholesteremia   . Seizures   . Pneumonia   . Polycythemia   . Coronary artery disease     Past Surgical History  Procedure Date  . Angioplasty   . Colonoscopy w/ endoscopic [place]   . Vasectomy   . Throat biopsy     Family History  Problem Relation Age of Onset  . Hypertension Mother   . Diabetes Mother   . Heart attack Mother   . Hypertension Sister   . Diabetes Sister     History  Substance Use Topics  . Smoking status: Former Smoker    Quit date: 03/09/2011  . Smokeless tobacco: Former Neurosurgeon  . Alcohol Use: No      Review of Systems  Musculoskeletal: Positive for myalgias.       Upper arm tenderness bilaterally  Neurological: Positive for headaches.  All other systems reviewed and are  negative.    Allergies  Review of patient's allergies indicates no known allergies.  Home Medications   Current Outpatient Rx  Name Route Sig Dispense Refill  . ALBUTEROL SULFATE HFA 108 (90 BASE) MCG/ACT IN AERS Inhalation Inhale 2 puffs into the lungs every 6 (six) hours as needed.      . ASPIRIN 81 MG PO TABS Oral Take 81 mg by mouth daily.      [name] CETIRIZINE HCL 10 MG PO TABS Oral Take 10 mg by mouth daily.      [name] FLUTICASONE-SALMETEROL 250-50 MCG/DOSE IN AEPB Inhalation Inhale 1 puff into the lungs every 12 (twelve) hours.      . OMEPRAZOLE 20 MG PO CPDR Oral Take 20 mg by mouth daily.      [name] SIMVASTATIN 20 MG PO TABS Oral Take 20 mg by mouth at bedtime.      . BUPROPION HCL ER (SR) 150 MG PO TB12 Oral Take 150 mg by mouth 2 (two) times daily.      [name] LORAZEPAM 0.5 MG PO TABS  Take one tablet, PO, 1 hour prior to phlebotomy 10 tablet 0  . CENTRUM PO Oral Take 1 tablet by mouth daily.        BP 90/55  Pulse 89  Temp(Src) 99.5 F (37.5 C) (Oral)  Resp 18  Ht 5\' 8"  (1.727 m)  Wt 139 lb (63.05 kg)  BMI 21.13 kg/m2  SpO2 95%  Physical Exam  Nursing note and vitals reviewed. Constitutional: He is oriented  to person, place, and time. He appears well-developed and well-nourished.  HENT:  Head: Normocephalic and atraumatic.  Mouth/Throat: Oropharynx is clear and moist.  Eyes: EOM are normal.  Neck: Normal range of motion. Neck supple.  Cardiovascular: Normal rate, normal heart sounds and intact distal pulses.   Pulmonary/Chest: Effort normal.       Crackles at both bases.  Abdominal: Soft. Bowel sounds are normal.  Musculoskeletal: Normal range of motion.  Neurological: He is alert and oriented to person, place, and time. He has normal reflexes.  Skin: Skin is warm and dry.       Mild warmth and erythema at both injection sites.     ED Course  Procedures (including critical care time)  Patient with recent flu and pneumonia shots here with myalgias, headache and  bilateral arm pain. Has received IVF, analgesics, antiemetics, taken PO fluids. States he feels better. Pt feels improved after observation and/or treatment in ED.Pt stable in ED with no significant deterioration in condition. MDM Reviewed: nursing note and vitals           Nicoletta Dress. Colon Branch, MD 04/24/11 [phone]

## 2011-04-29 ENCOUNTER — Other Ambulatory Visit (HOSPITAL_COMMUNITY): Payer: Medicare Other

## 2011-04-29 LAB — DIFFERENTIAL
Basophils Absolute: 0
Basophils Relative: 0
Eosinophils Absolute: 0.1
Eosinophils Relative: 2
Lymphocytes Relative: 30
Lymphs Abs: 2.3
Monocytes Absolute: 0.5
Monocytes Relative: 7
Neutro Abs: 4.7
Neutrophils Relative %: 61

## 2011-04-29 LAB — CBC
HCT: 48
Hemoglobin: 16.4
MCHC: 34.3
MCV: 91.6
Platelets: 189
RBC: 5.24
RDW: 13
WBC: 7.6

## 2011-04-29 LAB — POCT H PYLORI SCREEN: H. PYLORI SCREEN, POC: NEGATIVE

## 2011-04-29 LAB — I-STAT 8, (EC8 V) (CONVERTED LAB)
BUN: 14
Bicarbonate: 25.7 — ABNORMAL HIGH
Chloride: 105
Glucose, Bld: 136 — ABNORMAL HIGH
HCT: 51
Hemoglobin: 17.3 — ABNORMAL HIGH
Operator id: 235561
Potassium: 4.5
Sodium: 138
TCO2: 27
pCO2, Ven: 44.7 — ABNORMAL LOW
pH, Ven: 7.368 — ABNORMAL HIGH

## 2011-04-29 LAB — POCT I-STAT CREATININE
Creatinine, Ser: 1
Operator id: 235561

## 2011-04-30 ENCOUNTER — Ambulatory Visit (HOSPITAL_COMMUNITY): Payer: Medicare Other | Admitting: Oncology

## 2011-05-06 ENCOUNTER — Other Ambulatory Visit (HOSPITAL_COMMUNITY): Payer: Medicare Other

## 2011-05-06 ENCOUNTER — Encounter (HOSPITAL_BASED_OUTPATIENT_CLINIC_OR_DEPARTMENT_OTHER): Payer: Medicare Other

## 2011-05-06 DIAGNOSIS — D751 Secondary polycythemia: Secondary | ICD-10-CM

## 2011-05-06 DIAGNOSIS — J984 Other disorders of lung: Secondary | ICD-10-CM

## 2011-05-06 LAB — CBC
MCHC: 33.2 g/dL (ref 30.0–36.0)
Platelets: 286 10*3/uL (ref 150–400)
RDW: 13.3 % (ref 11.5–15.5)
WBC: 11 10*3/uL — ABNORMAL HIGH (ref 4.0–10.5)

## 2011-05-06 NOTE — Progress Notes (Signed)
Labs drawn today for cbc 

## 2011-05-07 ENCOUNTER — Encounter (HOSPITAL_BASED_OUTPATIENT_CLINIC_OR_DEPARTMENT_OTHER): Payer: Medicare Other | Admitting: Oncology

## 2011-05-07 ENCOUNTER — Encounter (HOSPITAL_COMMUNITY): Payer: Self-pay | Admitting: Oncology

## 2011-05-07 VITALS — BP 157/82 | HR 99 | Temp 97.8°F | Wt 139.0 lb

## 2011-05-07 DIAGNOSIS — J984 Other disorders of lung: Secondary | ICD-10-CM | POA: Insufficient documentation

## 2011-05-07 DIAGNOSIS — D751 Secondary polycythemia: Secondary | ICD-10-CM | POA: Insufficient documentation

## 2011-05-07 DIAGNOSIS — R222 Localized swelling, mass and lump, trunk: Secondary | ICD-10-CM

## 2011-05-07 DIAGNOSIS — R52 Pain, unspecified: Secondary | ICD-10-CM

## 2011-05-07 HISTORY — DX: Other disorders of lung: J98.4

## 2011-05-07 NOTE — Progress Notes (Signed)
[name], MD, MD [address] [place] [address]  1. Erythrocytosis  CBC, Differential, Basic metabolic panel    CURRENT THERAPY: S/P 3 phlebotomies for erythrocytosis  INTERVAL HISTORY: Ryan [name] 50 y.o. male returns for  regular  visit for followup of erythrocytosis and cavitary mass in left upper lobe of lung.   The patient reports intermittent and random extremity pain.  He reports that it is not in the same spot and occur randomly.  He explains that it occasionally it wakes him at night.  To resolve the pain, the patient mobilizes the extremity and it the pain eventually resolves.  I went over patient education regarding this symptom.  I wonder if it is related to vessel damage secondary to tobacco abuse.  As the vessel becomes irritated, spasms, it causes pain to distal regions.  Movement of the limb recruits more blood supply and the discomfort resolves.  I explained that since the pain resolves and does not have a particular pattern it is not worrisome at this time.  The patient denies any other complaints.  The patient has completed his antibiotic regimen of Augmentin.  He reports that he is smoking approximately 2 cigarettes daily compared to 2 packs per day in the past.  He would like to quit completely by mid November.    Past Medical History  Diagnosis Date  . Emphysema   . COPD (chronic obstructive pulmonary disease)   . Hypercholesteremia   . Seizures   . Pneumonia   . Polycythemia   . Coronary artery disease     has HYPERLIPIDEMIA; TOBACCO ABUSE; ATRIAL FIBRILLATION; ASTHMA; COPD; SLEEP APNEA; GERD with stricture; ASCVD (arteriosclerotic cardiovascular disease); and Glucose intolerance (pre-diabetes) on his problem list.     is allergic to influenza vaccine live.  Mr. Frieson does not currently have medications on file.  Past Surgical History  Procedure Date  . Angioplasty   . Colonoscopy w/ endoscopic [place]   . Vasectomy   . Throat biopsy      Denies any headaches, dizziness, double vision, fevers, chills, night sweats, nausea, vomiting, diarrhea, constipation, chest pain, heart palpitations, shortness of breath, blood in stool, black tarry stool, urinary pain, urinary burning, urinary frequency, hematuria.   PHYSICAL EXAMINATION  ECOG PERFORMANCE STATUS: 1 - Symptomatic but completely ambulatory  Filed Vitals:   05/07/11 1131  BP: 157/82  Pulse: 99  Temp: 97.8 F (36.6 C)    GENERAL:alert, no distress, well nourished, well developed, comfortable and cooperative SKIN: skin color, texture, turgor are normal HEAD: Normocephalic EYES: normal EARS: External ears normal OROPHARYNX:mucous membranes are moist and poor dentition  NECK: supple, no adenopathy, thyroid normal size, non-tender, without nodularity, trachea midline LYMPH:  no palpable lymphadenopathy, no hepatosplenomegaly BREAST:not examined LUNGS: clear to auscultation and percussion, decreased breath sounds throughout HEART: regular rate & rhythm, no murmurs, no gallops, S1 normal and S1, S2 normal ABDOMEN:abdomen soft, non-tender, normal bowel sounds and no hepatosplenomegaly BACK: Back symmetric, no curvature. EXTREMITIES:less then 2 second capillary refill, no joint deformities, effusion, or inflammation, no edema, no skin discoloration, no clubbing, no cyanosis  NEURO: alert & oriented x 3 with fluent speech, no focal motor/sensory deficits, gait normal   LABORATORY DATA: CBC    Component Value Date/Time   WBC 11.0* 05/06/2011 0933   RBC 4.96 05/06/2011 0933   HGB 14.7 05/06/2011 0933   HCT 44.3 05/06/2011 0933   PLT 286 05/06/2011 0933   MCV 89.3 05/06/2011 0933   MCH  29.6 05/06/2011 0933   MCHC 33.2 05/06/2011 0933   RDW 13.3 05/06/2011 0933   LYMPHSABS 3.8 03/18/2011 0449   MONOABS 0.9 03/18/2011 0449   EOSABS 0.3 03/18/2011 0449   BASOSABS 0.1 03/18/2011 0449      Chemistry      Component Value Date/Time   NA 138 03/18/2011 0357   K 3.8  03/18/2011 0357   CL 104 03/18/2011 0357   CO2 24 03/12/2011 1158   BUN 15 03/18/2011 0357   CREATININE 1.20 03/18/2011 0357      Component Value Date/Time   CALCIUM 8.8 03/12/2011 1158   ALKPHOS 75 03/12/2011 1158   AST 19 03/12/2011 1158   ALT 25 03/12/2011 1158   BILITOT 0.3 03/12/2011 1158       RADIOGRAPHIC STUDIES:  03/19/11  *RADIOLOGY REPORT*  Clinical Data: Abnormal chest radiograph  CT CHEST, ABDOMEN AND PELVIS WITH CONTRAST  Technique: Multidetector CT imaging of the chest, abdomen and  pelvis was performed following the standard protocol during bolus  administration of intravenous contrast.  Contrast: 100 ml Omnipaque 300  Comparison: Chest radiograph 03/18/2011, CT 07/04/2011the  CT CHEST  Findings: Within the left upper lobe there is a new small cavitary  lesion which corresponds to the plain film abnormality. This was  not present on comparison CT from 01/14/2010. Lesion is irregular  but measures approximately 19 x 13 mm (image 28, series 3). On the  coronal projection and sagittal projection there is suggestion of  early cavitation with early wall thickening.  There is severe central lobular emphysema at the lung apices.  There is a focus of nodular scarring at the left posterior upper  lobe measuring 16 mm which is unchanged from prior.  No evidence of mediastinal lymphadenopathy. 8 mm left hilar lymph  node (image 30). No pericardial fluid. Coronary artery  calcifications are present.  No axillary supraclavicular lymphadenopathy.  IMPRESSION:  1. New 2 cm cavitary lesion in the left upper lobe. Differential  would include infection versus carcinoma. This lesion may be  difficult to biopsy from a percutaneous approach do to the severe  emphysema and cavitary morphology. Endobronchial approach may be  feasible. FDG PET scan would likely be hypermetabolic with  infection or carcinoma but would add in staging.  2. Indeterminate left hilar lymph node.  CT ABDOMEN AND  PELVIS  Findings: No focal hepatic lesion. The gallbladder, pancreas,  spleen, adrenal glands, kidneys are normal.  Stomach, small bowel limited view the colon appear normal. No  retroperitoneal lymphadenopathy. Aorta normal caliber.  Review of bone windows demonstrates no aggressive osseous lesions.  IMPRESSION:  No evidence of abdominal metastasis. No lymphadenopathy.  Original Report Authenticated By: Genevive Bi, M.D.    ASSESSMENT:  1. Cavitary mass in left upper lobe of lung 2. 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 COPD, smoking 2 cigarettes daily compared to 2 ppd in the past. 4. Significantly decreased breath sounds throughout.   PLAN:  1. CT scan of chest without contrast the week of November 6th to further evaluate lung mass that is thought to be infection/abscess vs carcinoma. 2. I personally reviewed and went over laboratory results with the patient. 3. Lab work the day prior to return visit: CBC diff, CMET/BMET 4. Return in 3 weeks for follow-up and to go over CT scan results.   All questions were answered. The patient knows to call the clinic with any problems, questions or concerns. We can certainly see the patient much sooner if necessary.  KEFALAS,THOMAS

## 2011-05-07 NOTE — Patient Instructions (Signed)
[facility]  Discharge Instructions  RECOMMENDATIONS MADE BY THE CONSULTANT AND ANY TEST RESULTS WILL BE SENT TO YOUR REFERRING DOCTOR.   SPECIAL INSTRUCTIONS/FOLLOW-UP: Lab work Needed the day before you return to the clinic and Return to Clinic three weeks after CT scan.  CT scan to be done in November as scheduled.  See the front desk for appointments.   I acknowledge that I have been informed and understand all the instructions given to me and received a copy. I do not have any more questions at this time, but understand that I may call the Specialty Clinic at [facility] at [phone] during business hours should I have any further questions or need assistance in obtaining follow-up care.    __________________________________________  _____________  __________ Signature of Patient or Authorized Representative            Date                   Time    __________________________________________ Nurse's Signature

## 2011-05-19 ENCOUNTER — Ambulatory Visit (HOSPITAL_COMMUNITY)
Admission: RE | Admit: 2011-05-19 | Discharge: 2011-05-19 | Disposition: A | Discharge status: Home / Self Care | Payer: Medicare Other | Source: Ambulatory Visit | Attending: Oncology | Admitting: Oncology

## 2011-05-19 DIAGNOSIS — R079 Chest pain, unspecified: Secondary | ICD-10-CM | POA: Insufficient documentation

## 2011-05-19 DIAGNOSIS — R918 Other nonspecific abnormal finding of lung field: Secondary | ICD-10-CM | POA: Insufficient documentation

## 2011-05-19 DIAGNOSIS — R911 Solitary pulmonary nodule: Secondary | ICD-10-CM

## 2011-05-19 DIAGNOSIS — J984 Other disorders of lung: Secondary | ICD-10-CM | POA: Insufficient documentation

## 2011-05-20 ENCOUNTER — Encounter (HOSPITAL_COMMUNITY): Payer: Self-pay | Admitting: Emergency Medicine

## 2011-05-20 ENCOUNTER — Other Ambulatory Visit (HOSPITAL_COMMUNITY): Payer: Self-pay | Admitting: Oncology

## 2011-05-20 ENCOUNTER — Emergency Department (HOSPITAL_COMMUNITY): Payer: Medicare Other

## 2011-05-20 ENCOUNTER — Encounter (HOSPITAL_COMMUNITY): Payer: Medicare Other | Attending: Oncology

## 2011-05-20 ENCOUNTER — Emergency Department (HOSPITAL_COMMUNITY)
Admission: EM | Admit: 2011-05-20 | Discharge: 2011-05-20 | Disposition: A | Discharge status: Home / Self Care | Payer: Medicare Other | Attending: Emergency Medicine | Admitting: Emergency Medicine

## 2011-05-20 DIAGNOSIS — F17200 Nicotine dependence, unspecified, uncomplicated: Secondary | ICD-10-CM | POA: Insufficient documentation

## 2011-05-20 DIAGNOSIS — E785 Hyperlipidemia, unspecified: Secondary | ICD-10-CM | POA: Insufficient documentation

## 2011-05-20 DIAGNOSIS — R7309 Other abnormal glucose: Secondary | ICD-10-CM | POA: Insufficient documentation

## 2011-05-20 DIAGNOSIS — R079 Chest pain, unspecified: Secondary | ICD-10-CM | POA: Insufficient documentation

## 2011-05-20 DIAGNOSIS — R569 Unspecified convulsions: Secondary | ICD-10-CM | POA: Insufficient documentation

## 2011-05-20 DIAGNOSIS — D751 Secondary polycythemia: Secondary | ICD-10-CM | POA: Insufficient documentation

## 2011-05-20 DIAGNOSIS — R0602 Shortness of breath: Secondary | ICD-10-CM | POA: Insufficient documentation

## 2011-05-20 DIAGNOSIS — J438 Other emphysema: Secondary | ICD-10-CM | POA: Insufficient documentation

## 2011-05-20 DIAGNOSIS — J984 Other disorders of lung: Secondary | ICD-10-CM

## 2011-05-20 DIAGNOSIS — R918 Other nonspecific abnormal finding of lung field: Secondary | ICD-10-CM

## 2011-05-20 DIAGNOSIS — I2510 Atherosclerotic heart disease of native coronary artery without angina pectoris: Secondary | ICD-10-CM | POA: Insufficient documentation

## 2011-05-20 DIAGNOSIS — Z9861 Coronary angioplasty status: Secondary | ICD-10-CM | POA: Insufficient documentation

## 2011-05-20 DIAGNOSIS — R222 Localized swelling, mass and lump, trunk: Secondary | ICD-10-CM | POA: Insufficient documentation

## 2011-05-20 DIAGNOSIS — Z7982 Long term (current) use of aspirin: Secondary | ICD-10-CM | POA: Insufficient documentation

## 2011-05-20 HISTORY — DX: Prediabetes: R73.03

## 2011-05-20 LAB — DIFFERENTIAL
Basophils Relative: 0 % (ref 0–1)
Eosinophils Absolute: 0.2 10*3/uL (ref 0.0–0.7)
Lymphs Abs: 2.7 10*3/uL (ref 0.7–4.0)
Monocytes Relative: 10 % (ref 3–12)
Neutro Abs: 4.9 10*3/uL (ref 1.7–7.7)
Neutrophils Relative %: 57 % (ref 43–77)

## 2011-05-20 LAB — CBC
Hemoglobin: 14.5 g/dL (ref 13.0–17.0)
MCHC: 32.6 g/dL (ref 30.0–36.0)
Platelets: 187 10*3/uL (ref 150–400)
RBC: 4.98 MIL/uL (ref 4.22–5.81)

## 2011-05-20 LAB — COMPREHENSIVE METABOLIC PANEL
ALT: 40 U/L (ref 0–53)
Albumin: 3.2 g/dL — ABNORMAL LOW (ref 3.5–5.2)
Alkaline Phosphatase: 79 U/L (ref 39–117)
BUN: 12 mg/dL (ref 6–23)
Chloride: 102 mEq/L (ref 96–112)
Potassium: 4.2 mEq/L (ref 3.5–5.1)
Sodium: 136 mEq/L (ref 135–145)
Total Bilirubin: 0.3 mg/dL (ref 0.3–1.2)
Total Protein: 7.3 g/dL (ref 6.0–8.3)

## 2011-05-20 MED ORDER — TUBERCULIN PPD 5 UNIT/0.1ML ID SOLN
5.0000 [IU] | Freq: Once | INTRADERMAL | Status: DC
  Filled 2011-05-20: qty 0.1

## 2011-05-20 MED ORDER — TUBERCULIN PPD 5 UNIT/0.1ML ID SOLN
5.0000 [IU] | Freq: Once | INTRADERMAL | Status: AC
  Administered 2011-05-20: 5 [IU] via INTRADERMAL
  Filled 2011-05-20: qty 0.1

## 2011-05-20 MED ORDER — OXYCODONE-ACETAMINOPHEN 5-325 MG PO TABS
1.0000 | ORAL_TABLET | Freq: Once | ORAL | Status: AC
  Administered 2011-05-20: 1 via ORAL
  Filled 2011-05-20: qty 1

## 2011-05-20 MED ORDER — OXYCODONE-ACETAMINOPHEN 5-325 MG PO TABS: 1.0000 | ORAL_TABLET | Freq: Four times a day (QID) | ORAL | Status: AC | PRN

## 2011-05-20 NOTE — Progress Notes (Signed)
Ryan [name] presents today for injection per MD orders. TB skin test administered intradermally in right forearm. Administration without incident. Patient tolerated well.

## 2011-05-20 NOTE — ED Notes (Signed)
Patient c/o chills x3 days with shortness of breath, coughing, and back pain starting yesterday. Per patient hurts on left side to take deep breaths. Per pt hx of COPD with pneumonia.

## 2011-05-20 NOTE — ED Provider Notes (Cosign Needed)
History    Scribed for Ryan Lennert, MD, the patient was seen in room APA12/APA12. This chart was scribed by Katha Cabal.   CSN: [id number] Arrival date & time: 05/20/2011  9:33 AM   First MD Initiated Contact with Patient 05/20/11 0940      Chief Complaint  Patient presents with  . Shortness of Breath  . Chills  . Back Pain    (Consider location/radiation/quality/duration/timing/severity/associated sxs/prior treatment) Patient is a 50 y.o. male presenting with shortness of breath and chest pain. The history is provided by the patient. No language interpreter was used.  Shortness of Breath  The current episode started yesterday. The onset was sudden. The problem occurs continuously. The problem has been unchanged. The problem is moderate. The symptoms are relieved by nothing. Associated symptoms include chest pain, cough (productive), shortness of breath and wheezing. Pertinent negatives include no fever and no sore throat. The cough is productive. His past medical history is significant for past wheezing. Past medical history comments: left lung mass, PNA, . Recently, medical care has been given at this facility and by a specialist. Services received include tests performed.  Chest Pain The chest pain began yesterday. The chest pain is unchanged. The pain is associated with breathing. The quality of the pain is described as pleuritic. The pain does not radiate. Chest pain is worsened by deep breathing. Primary symptoms include shortness of breath, cough (productive) and wheezing. Pertinent negatives for primary symptoms include no fever, no fatigue, no abdominal pain, no nausea and no vomiting. Risk factors include smoking/tobacco exposure and male gender.  His past medical history is significant for CAD, COPD and seizures. Past medical history comments: left lung mass, PNA,   His family medical history is significant for diabetes in family, heart disease in family and hypertension in  family.   Patient reports worsening of SOB with left chest pain that woke patient up at 4 AM.  SOB and chest pain persist in ED.  Patient had chest CT scan yesterday here.  PCP Dr. Lew [place]  Glenford Peers MD Oncology   Past Medical History  Diagnosis Date  . Emphysema   . COPD (chronic obstructive pulmonary disease)   . Hypercholesteremia   . Seizures   . Pneumonia   . Polycythemia   . Coronary artery disease   . Cavitary lesion of lung 05/07/2011  . Borderline diabetes     Past Surgical History  Procedure Date  . Angioplasty   . Colonoscopy w/ endoscopic [place]   . Vasectomy   . Throat biopsy     Family History  Problem Relation Age of Onset  . Hypertension Mother   . Diabetes Mother   . Heart attack Mother   . Heart failure Mother   . Hypertension Sister   . Diabetes Sister   . Heart failure Sister     History  Substance Use Topics  . Smoking status: Current Everyday Smoker -- 0.5 packs/day for 45 years    Types: Cigarettes  . Smokeless tobacco: Former Neurosurgeon  . Alcohol Use: No      Review of Systems  Constitutional: Positive for chills (x3 days). Negative for fever and fatigue.  HENT: Negative for congestion, sore throat, sinus pressure and ear discharge.   Eyes: Negative for discharge.  Respiratory: Positive for cough (productive), shortness of breath and wheezing.   Cardiovascular: Positive for chest pain.  Gastrointestinal: Negative for nausea, vomiting, abdominal pain and diarrhea.  Genitourinary: Negative for frequency and  hematuria.  Musculoskeletal: Negative for back pain.  Skin: Negative for rash.  Neurological: Positive for seizures. Negative for headaches.  Hematological: Negative.   Psychiatric/Behavioral: Negative for hallucinations.    Allergies  Influenza vaccine live  Home Medications   Current Outpatient Rx  Name Route Sig Dispense Refill  . ALBUTEROL SULFATE HFA 108 (90 BASE) MCG/ACT IN AERS Inhalation Inhale 2 puffs into  the lungs every 6 (six) hours as needed. Shortness of breath    . ASPIRIN 81 MG PO TABS Oral Take 81 mg by mouth daily.      . BUPROPION HCL ER (SR) 150 MG PO TB12 Oral Take 150 mg by mouth 2 (two) times daily.      [name] CETIRIZINE HCL 10 MG PO TABS Oral Take 10 mg by mouth daily.      [name] FLUTICASONE-SALMETEROL 250-50 MCG/DOSE IN AEPB Inhalation Inhale 1 puff into the lungs every 12 (twelve) hours.      . OMEPRAZOLE 20 MG PO CPDR Oral Take 20 mg by mouth daily.      [name] SIMVASTATIN 20 MG PO TABS Oral Take 20 mg by mouth at bedtime.      . OXYCODONE-ACETAMINOPHEN 5-325 MG PO TABS Oral Take 1 tablet by mouth every 6 (six) hours as needed for pain. 30 tablet 0    BP 116/78  Pulse 87  Temp(Src) 98.1 F (36.7 C) (Oral)  Resp 16  Ht 5\' 8"  (1.727 m)  Wt 139 lb (63.05 kg)  BMI 21.13 kg/m2  SpO2 91%  Physical Exam  Constitutional: He is oriented to person, place, and time. He appears well-developed.  HENT:  Head: Normocephalic and atraumatic.       edentulous  Eyes: Conjunctivae and EOM are normal. No scleral icterus.  Neck: Neck supple. No thyromegaly present.  Cardiovascular: Normal rate and regular rhythm.  Exam reveals no gallop and no friction rub.   No murmur heard. Pulmonary/Chest: Effort normal. No stridor. He has wheezes. He has no rales. He exhibits no tenderness.       Mild bilateral wheezes   Abdominal: He exhibits no distension. There is no tenderness. There is no rebound.  Musculoskeletal: Normal range of motion. He exhibits no edema.  Lymphadenopathy:    He has no cervical adenopathy.  Neurological: He is oriented to person, place, and time. Coordination normal.  Skin: No rash noted. No erythema.  Psychiatric: He has a normal mood and affect. His behavior is normal.    ED Course  Procedures (including critical care time)   DIAGNOSTIC STUDIES: Oxygen Saturation is 94% on room air, adequate by my interpretation.    COORDINATION OF CARE:  9:47 PM  Physical exam complete.   Will review chest CT from yesterday.   9:49 AM  Reviewed CT scan.  Left lung mass in upper lobe.  Patient sees Glenford Peers MD oncology at hospital.  12:45 PM  Pain controlled.  Patient has improved.  Discussed case and reatment with Glenford Peers MD.  Plan to discharge patient home with pain medication.  Patient agrees with plan.    Orders Placed This Encounter  Procedures  . DG Chest 2 View  . CBC  . Differential  . Comprehensive metabolic panel  . Consult to radiation oncology     LABS / RADIOLOGY:  Labs Reviewed  COMPREHENSIVE METABOLIC PANEL - Abnormal; Notable for the following:    Glucose, Bld 133 (*)    Albumin 3.2 (*)    GFR calc non Af Amer 76 (*)  GFR calc Af Amer 88 (*)    All other components within normal limits  CBC  DIFFERENTIAL   Results for orders placed during the hospital encounter of 05/20/11  CBC      Component Value Range   WBC 8.7  4.0 - 10.5 (K/uL)   RBC 4.98  4.22 - 5.81 (MIL/uL)   Hemoglobin 14.5  13.0 - 17.0 (g/dL)   HCT 29.5  28.4 - 13.2 (%)   MCV 89.4  78.0 - 100.0 (fL)   MCH 29.1  26.0 - 34.0 (pg)   MCHC 32.6  30.0 - 36.0 (g/dL)   RDW 44.0  10.2 - 72.5 (%)   Platelets 187  150 - 400 (K/uL)  DIFFERENTIAL      Component Value Range   Neutrophils Relative 57  43 - 77 (%)   Neutro Abs 4.9  1.7 - 7.7 (K/uL)   Lymphocytes Relative 31  12 - 46 (%)   Lymphs Abs 2.7  0.7 - 4.0 (K/uL)   Monocytes Relative 10  3 - 12 (%)   Monocytes Absolute 0.9  0.1 - 1.0 (K/uL)   Eosinophils Relative 2  0 - 5 (%)   Eosinophils Absolute 0.2  0.0 - 0.7 (K/uL)   Basophils Relative 0  0 - 1 (%)   Basophils Absolute 0.0  0.0 - 0.1 (K/uL)  COMPREHENSIVE METABOLIC PANEL      Component Value Range   Sodium 136  135 - 145 (mEq/L)   Potassium 4.2  3.5 - 5.1 (mEq/L)   Chloride 102  96 - 112 (mEq/L)   CO2 27  19 - 32 (mEq/L)   Glucose, Bld 133 (*) 70 - 99 (mg/dL)   BUN 12  6 - 23 (mg/dL)   Creatinine, Ser 3.66  0.50 - 1.35 (mg/dL)   Calcium 8.9  8.4 - 44.0  (mg/dL)   Total Protein 7.3  6.0 - 8.3 (g/dL)   Albumin 3.2 (*) 3.5 - 5.2 (g/dL)   AST 21  0 - 37 (U/L)   ALT 40  0 - 53 (U/L)   Alkaline Phosphatase 79  39 - 117 (U/L)   Total Bilirubin 0.3  0.3 - 1.2 (mg/dL)   GFR calc non Af Amer 76 (*) >90 (mL/min)   GFR calc Af Amer 88 (*) >90 (mL/min)    Dr Jodene Nam to call      MDM   MDM:      MEDICATIONS GIVEN IN THE E.D. Scheduled Meds:    . oxyCODONE-acetaminophen  1 tablet Oral Once   Continuous Infusions:     IMPRESSION: 1. Lung mass     The chart was scribed for me under my direct supervision.  I personally performed the history, physical, and medical decision making and all procedures in the evaluation of this patient.Ryan Lennert, MD 05/20/11 [phone]

## 2011-05-22 ENCOUNTER — Encounter (HOSPITAL_BASED_OUTPATIENT_CLINIC_OR_DEPARTMENT_OTHER): Payer: Medicare Other

## 2011-05-22 ENCOUNTER — Telehealth (HOSPITAL_COMMUNITY): Payer: Self-pay | Admitting: *Deleted

## 2011-05-22 DIAGNOSIS — J984 Other disorders of lung: Secondary | ICD-10-CM

## 2011-05-22 NOTE — Telephone Encounter (Signed)
TB skin test negative. Patient quit taking the percocet that he was ordered on Wednesday pm because he started having trouble urinating. He read that that could be a s/e of the percocet.  Symptoms include trouble getting urine stream started. It may take him "5" minutes of standing to get urine to come out. Once he starts urinating, he can empty his bladder. Patient not having trouble getting erections. Please advise on the pain medicine and if you think this is actually coming from the Percocet.... He feels like he needs a different pain med. Also, patient is due for labs on Monday - can we add a PSA level to it? He sees you Wednesday.

## 2011-05-22 NOTE — Progress Notes (Signed)
Patient's TB skin test is negative. Verified by two RNs. Rosana Berger and [place].

## 2011-05-23 ENCOUNTER — Telehealth (HOSPITAL_COMMUNITY): Payer: Self-pay | Admitting: *Deleted

## 2011-05-23 ENCOUNTER — Other Ambulatory Visit (HOSPITAL_COMMUNITY): Payer: Self-pay | Admitting: Oncology

## 2011-05-23 DIAGNOSIS — R52 Pain, unspecified: Secondary | ICD-10-CM

## 2011-05-23 MED ORDER — TRAMADOL HCL 50 MG PO TABS: 50.0000 mg | ORAL_TABLET | Freq: Four times a day (QID) | ORAL | Status: AC | PRN

## 2011-05-23 NOTE — Telephone Encounter (Signed)
Message copied by Dennie Maizes on Fri May 23, 2011  1:37 PM ------      Message from: Ellouise Newer III      Created: Fri May 23, 2011  1:09 PM       Let patient know I escribed ultram to [facility].

## 2011-05-23 NOTE — Telephone Encounter (Signed)
Message left on answering machine ass below.

## 2011-05-26 ENCOUNTER — Encounter (HOSPITAL_BASED_OUTPATIENT_CLINIC_OR_DEPARTMENT_OTHER): Payer: Medicare Other

## 2011-05-26 DIAGNOSIS — D751 Secondary polycythemia: Secondary | ICD-10-CM

## 2011-05-26 LAB — CBC
HCT: 45.7 % (ref 39.0–52.0)
MCH: 29.4 pg (ref 26.0–34.0)
MCHC: 33.5 g/dL (ref 30.0–36.0)
MCV: 87.7 fL (ref 78.0–100.0)
Platelets: 210 10*3/uL (ref 150–400)
RDW: 13.3 % (ref 11.5–15.5)
WBC: 8.8 10*3/uL (ref 4.0–10.5)

## 2011-05-26 LAB — BASIC METABOLIC PANEL
CO2: 26 mEq/L (ref 19–32)
Calcium: 9.5 mg/dL (ref 8.4–10.5)
Creatinine, Ser: 1.16 mg/dL (ref 0.50–1.35)
GFR calc Af Amer: 82 mL/min — ABNORMAL LOW (ref >90)
GFR calc non Af Amer: 71 mL/min — ABNORMAL LOW (ref >90)
Sodium: 136 mEq/L (ref 135–145)

## 2011-05-26 LAB — DIFFERENTIAL
Basophils Absolute: 0 10*3/uL (ref 0.0–0.1)
Basophils Relative: 0 % (ref 0–1)
Eosinophils Absolute: 0.2 10*3/uL (ref 0.0–0.7)
Eosinophils Relative: 2 % (ref 0–5)
Monocytes Absolute: 0.8 10*3/uL (ref 0.1–1.0)

## 2011-05-26 NOTE — Progress Notes (Signed)
Labs drawn today for cbc/diff,bmp 

## 2011-05-28 ENCOUNTER — Encounter (HOSPITAL_COMMUNITY): Payer: Self-pay | Admitting: Oncology

## 2011-05-28 ENCOUNTER — Encounter (HOSPITAL_BASED_OUTPATIENT_CLINIC_OR_DEPARTMENT_OTHER): Payer: Medicare Other | Admitting: Oncology

## 2011-05-28 VITALS — BP 118/79 | HR 82 | Temp 98.1°F | Wt 140.2 lb

## 2011-05-28 DIAGNOSIS — J984 Other disorders of lung: Secondary | ICD-10-CM

## 2011-05-28 NOTE — Progress Notes (Signed)
Ryan Baseman, MD, MD [address] [place] [address]  1. Cavitary lesion of lung      INTERVAL HISTORY: Ryan [name] 50 y.o. male returns for  regular  visit for followup of a lung mass.   In the past, this lung mass was thought to be a possible abscess or infection. As result the patient was placed on a long course of antibiotics. Despite antibiotic usage, the patient's mass has increased in size per CT scan. As result this is worrisome for malignancy. Therefore the patient was set up to be seen by Dr. Karle Plumber on 06/03/2011 for further evaluation and consideration of biopsy of this mass.   The patient is seen today to go over recent CAT scan results and also his laboratory work. He is also seen to answer any questions he may have..  I personally reviewed and went over radiographic studies with the patient. He understands that his recent CT scan shows interval increase in the size of the cavitary mass. This is worrisome for malignancy. He understands that a biopsy is the next step in the diagnosis of his lung issue.  I personally reviewed and went over laboratory results with the patient. The patient knows his laboratory work was wonderful. His laboratory work is truly unremarkable.  The patient denies any complaints. He thanks me for the pain medication I prescribed him on his last visit.  His pain is well-controlled and he is sleeping much better. She reiterates the fact that he only takes the pain medication when he needed. He explains that he hates taking medication.  I am also pleased to report that the patient explains that he quit smoking tobacco on Sunday.  Past Medical History  Diagnosis Date  . Emphysema   . COPD (chronic obstructive pulmonary disease)   . Hypercholesteremia   . Seizures   . Pneumonia   . Polycythemia   . Coronary artery disease   . Cavitary lesion of lung 05/07/2011  . 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     is allergic to influenza vaccine live.  Ryan [name] does not currently have medications on file.  Past Surgical History  Procedure Date  . Angioplasty   . Colonoscopy w/ endoscopic [place]   . Vasectomy   . Throat biopsy     Denies any headaches, dizziness, double vision, fevers, chills, night sweats, nausea, vomiting, diarrhea, constipation, chest pain, heart palpitations, shortness of breath, blood in stool, black tarry stool, urinary pain, urinary burning, urinary frequency, hematuria.   PHYSICAL EXAMINATION  ECOG PERFORMANCE STATUS: 1 - Symptomatic but completely ambulatory  Filed Vitals:   05/28/11 1000  BP: 118/79  Pulse: 82  Temp: 98.1 F (36.7 C)    GENERAL:alert, well nourished, well developed, comfortable and cooperative SKIN: skin color, texture, turgor are normal HEAD: Normocephalic EYES: normal EARS: External ears normal OROPHARYNX: Not examined  NECK: trachea midline LYMPH:  not examined BREAST:not examined LUNGS: Not examined HEART: Not examined ABDOMEN: Not examined BACK: Not examined EXTREMITIES:no skin discoloration  NEURO: alert & oriented x 3 with fluent speech, no focal motor/sensory deficits, gait normal    LABORATORY DATA: CBC    Component Value Date/Time   WBC 8.8 05/26/2011 1156   RBC 5.21 05/26/2011 1156   HGB 15.3 05/26/2011  1156   HCT 45.7 05/26/2011 1156   PLT 210 05/26/2011 1156   MCV 87.7 05/26/2011 1156   MCH 29.4 05/26/2011 1156   MCHC 33.5 05/26/2011 1156   RDW 13.3 05/26/2011 1156   LYMPHSABS 2.3 05/26/2011 1156   MONOABS 0.8 05/26/2011 1156   EOSABS 0.2 05/26/2011 1156   BASOSABS 0.0 05/26/2011 1156      Chemistry      Component Value Date/Time   NA 136 05/26/2011 1156   K 4.1  05/26/2011 1156   CL 101 05/26/2011 1156   CO2 26 05/26/2011 1156   BUN 11 05/26/2011 1156   CREATININE 1.16 05/26/2011 1156      Component Value Date/Time   CALCIUM 9.5 05/26/2011 1156   ALKPHOS 79 05/20/2011 0958   AST 21 05/20/2011 0958   ALT 40 05/20/2011 0958   BILITOT 0.3 05/20/2011 0958       RADIOGRAPHIC STUDIES:  05/19/11  *RADIOLOGY REPORT*  Clinical Data: Left-sided lung nodule, history of abscess treated  with antibiotics. Chest pain  CT CHEST WITHOUT CONTRAST  Technique: Multidetector CT imaging of the chest was performed  following the standard protocol without IV contrast.  Comparison: 03/19/2011 CT  Findings: Heart size is normal. No pericardial or pleural  effusion. No lymphadenopathy. The previously measured left  suprahilar lymph node cannot be accurately measured on today's exam  due to lack of conspicuity as compared to adjacent noncalcified  vascular structures. Coronary arterial calcifications are present.  Severe diffuse bilateral emphysematous changes are again noted.  Nodular area of distortion in the left upper lobe measures 1.5 cm  at the same anatomic level, allowing for differences in technique.  Previously seen cavitary lesion at the base of the left upper lobe  is larger, the overall area of consolidation measuring 5.9 x 4.4 cm  by image 28, at the same anatomic level. A confluent secondary  cavitary component is noted inferiorly, image 30. 5 mm lingular  pulmonary nodule image 45, is stable. The right lung is clear.  No acute osseous finding. No lytic or sclerotic osseous lesion.  IMPRESSION:  Enlarging cavitary left upper lobe pulmonary mass like lesion.  This could represent progressive primary malignancy (much less  likely metastasis), or progressive infection. Consider  percutaneous biopsy for further evaluation; due to enlargement,  more of the lesion is in close proximity to the pleura, which may  reduce the chance of biopsy related  complication such as  pneumothorax.  Original Report Authenticated By: Harrel Lemon, M.D.    ASSESSMENT:  1. Increasing mass in lung 2. H/O tobacco abuse, quit smoking on 05/25/2003. 3. Lung disease, secondary to tobacco abuse   PLAN:  1. Scheduled to see Dr. Edwyna Shell on 06/03/11 for biopsy consideration of lung mass that has increased in size despite antibiotics to evaluate for lung carcinoma. 2. Return in 6 weeks.  Pending results, will see patient sooner if needed.  3. If biopsy is negative for carcinoma will release patient from clinic and ask him to follow-up with PCP. 4. I personally reviewed and went over laboratory results with the patient. 5. I personally reviewed and went over radiographic studies with the patient. 6. The patient's wife was hand deliver it Dr. Scheryl Darter address and telephone number for their trip. She reports that she does have a GPS system and will put you in his address to find the correct building. I explained to her that his offices across the street from [facility] in the [facility] medical building.  All questions were answered. The patient knows to call the clinic with any problems, questions or concerns. We can certainly see the patient much sooner if necessary.  The patient and plan discussed with Glenford Peers, MD and he is in agreement with the aforementioned.   Gumecindo Hopkin

## 2011-05-28 NOTE — Patient Instructions (Signed)
Tavarius Grewe Castelluccio  [id number] November 07, 1960  [facility]  Discharge Instructions  RECOMMENDATIONS MADE BY THE CONSULTANT AND ANY TEST RESULTS WILL BE SENT TO YOUR REFERRING DOCTOR.   EXAM FINDINGS BY MD TODAY AND SIGNS AND SYMPTOMS TO REPORT TO CLINIC OR PRIMARY MD: will wait to see what Dr. Edwyna Shell plans to do.  MEDICATIONS PRESCRIBED: none  INSTRUCTIONS GIVEN AND DISCUSSED: Report increased shortness of breath, blood in sputum, etc.  SPECIAL INSTRUCTIONS/FOLLOW-UP: Return to Clinic on 6 months.   I acknowledge that I have been informed and understand all the instructions given to me and received a copy. I do not have any more questions at this time, but understand that I may call the Specialty Clinic at [facility] at [phone] during business hours should I have any further questions or need assistance in obtaining follow-up care.    __________________________________________  _____________  __________ Signature of Patient or Authorized Representative            Date                   Time    __________________________________________ Nurse's Signature

## 2011-06-03 ENCOUNTER — Other Ambulatory Visit: Payer: Self-pay | Admitting: Thoracic Surgery

## 2011-06-03 ENCOUNTER — Ambulatory Visit (INDEPENDENT_AMBULATORY_CARE_PROVIDER_SITE_OTHER): Payer: Medicare Other | Admitting: Thoracic Surgery

## 2011-06-03 ENCOUNTER — Encounter: Payer: Self-pay | Admitting: Thoracic Surgery

## 2011-06-03 VITALS — BP 114/81 | HR 98 | Resp 20 | Ht 68.0 in | Wt 140.0 lb

## 2011-06-03 DIAGNOSIS — R222 Localized swelling, mass and lump, trunk: Secondary | ICD-10-CM

## 2011-06-03 DIAGNOSIS — D381 Neoplasm of uncertain behavior of trachea, bronchus and lung: Secondary | ICD-10-CM

## 2011-06-03 DIAGNOSIS — R918 Other nonspecific abnormal finding of lung field: Secondary | ICD-10-CM

## 2011-06-03 NOTE — Progress Notes (Signed)
PCP is [name], MD, MD Referring Provider is Randall An, MD  Chief Complaint  Patient presents with  . Lung Mass    Referral from Dr Mariel Sleet for surgical eval, Lung Mass, CT Chest 05/29/11     HPI: The patient is been followed for an enlarging left upper lobe lung mass or infiltrate. He has severe chronic obstructive pulmonary disease. He quit smoking 2 weeks ago. He's had no marked this is a says he had some fever and chills. This chest x-ray shows a regular left upper lobe posterior segment no mass with some cavitation. He has had no weight loss. We plan to get a PET scan on him. Have scheduled him for bronchoscopy with electromagnetic navigation. Risk of the procedure were explained to patient and made chance of success is moderate. Patient understands risk and agrees to the surgery.   Past Medical History  Diagnosis Date  . Emphysema   . COPD (chronic obstructive pulmonary disease)   . Hypercholesteremia   . Seizures   . Pneumonia   . Polycythemia   . Coronary artery disease   . Cavitary lesion of lung 05/07/2011  . Borderline diabetes     Past Surgical History  Procedure Date  . Angioplasty   . Colonoscopy w/ endoscopic [place]   . Vasectomy   . Throat biopsy     Family History  Problem Relation Age of Onset  . Hypertension Mother   . Diabetes Mother   . Heart attack Mother   . Heart failure Mother   . Hypertension Sister   . Diabetes Sister   . Heart failure Sister     Social History History  Substance Use Topics  . Smoking status: Former Smoker -- 0.5 packs/day for 45 years    Types: Cigarettes    Quit date: 05/25/2011  . Smokeless tobacco: Former Neurosurgeon  . Alcohol Use: No    Current Outpatient Prescriptions  Medication Sig Dispense Refill  . albuterol (VENTOLIN HFA) 108 (90 BASE) MCG/ACT inhaler Inhale 2 puffs into the lungs every 6 (six) hours as needed. Shortness of breath      . aspirin 81 MG tablet Take 81 mg by mouth daily.          [name] buPROPion (WELLBUTRIN SR) 150 MG 12 hr tablet Take 150 mg by mouth 2 (two) times daily.        . cetirizine (ZYRTEC) 10 MG tablet Take 10 mg by mouth daily.        . Fluticasone-Salmeterol (ADVAIR DISKUS) 250-50 MCG/DOSE AEPB Inhale 1 puff into the lungs every 12 (twelve) hours.        [name] omeprazole (PRILOSEC) 20 MG capsule Take 20 mg by mouth daily.        . simvastatin (ZOCOR) 20 MG tablet Take 20 mg by mouth at bedtime.        . traMADol (ULTRAM) 50 MG tablet Take 1 tablet (50 mg total) by mouth every 6 (six) hours as needed for pain. Maximum dose= 8 tablets per day  45 tablet  0    Allergies  Allergen Reactions  . Influenza Vaccine Live Swelling    Review of Systems  Constitutional: Positive for fever and chills. Negative for unexpected weight change.  HENT: Negative.   Eyes: Negative.   Respiratory: Negative for cough, chest tightness and wheezing.   Cardiovascular: Negative.   Gastrointestinal: Negative.   Genitourinary: Negative.   Musculoskeletal: Negative.   Neurological: Positive for seizures.  Hematological: Negative.  Psychiatric/Behavioral: Negative.     BP 114/81  Pulse 98  Resp 20  Ht 5\' 8"  (1.727 m)  Wt 140 lb (63.504 kg)  BMI 21.29 kg/m2  SpO2 92% Physical Exam  Constitutional: He appears well-developed and well-nourished.  HENT:  Head: Normocephalic.  Right Ear: External ear normal.  Left Ear: External ear normal.  Eyes: EOM are normal. Pupils are equal, round, and reactive to light.  Neck: Normal range of motion. Neck supple.  Cardiovascular: Normal rate, normal heart sounds and intact distal pulses.   Pulmonary/Chest: Effort normal and breath sounds normal. No respiratory distress. He has no wheezes.  Abdominal: Soft. Bowel sounds are normal.  Musculoskeletal: Normal range of motion.  Lymphadenopathy:    He has no cervical adenopathy.  Neurological: He is alert. He has normal reflexes.  Skin: Skin is warm.  Psychiatric: He has a normal mood  and affect. His behavior is normal. Judgment and thought content normal.     Diagnostic Tests: CT scan shows an enlarging cavitary mass left upper lobe chronic obstructive pulmonary disease  Impression: Left upper lobe mass cancer versus inflammatory   Plan PET scan bronchoscopy with electromagnetic navigation on November 28:

## 2011-06-04 ENCOUNTER — Encounter (HOSPITAL_COMMUNITY): Payer: Self-pay | Admitting: Pharmacy Technician

## 2011-06-04 ENCOUNTER — Other Ambulatory Visit: Payer: Self-pay

## 2011-06-04 DIAGNOSIS — D381 Neoplasm of uncertain behavior of trachea, bronchus and lung: Secondary | ICD-10-CM

## 2011-06-06 ENCOUNTER — Encounter (HOSPITAL_COMMUNITY)
Admission: RE | Admit: 2011-06-06 | Discharge: 2011-06-06 | Disposition: A | Discharge status: Home / Self Care | Payer: Medicare Other | Source: Ambulatory Visit | Attending: Thoracic Surgery | Admitting: Thoracic Surgery

## 2011-06-06 DIAGNOSIS — D381 Neoplasm of uncertain behavior of trachea, bronchus and lung: Secondary | ICD-10-CM

## 2011-06-06 DIAGNOSIS — R222 Localized swelling, mass and lump, trunk: Secondary | ICD-10-CM | POA: Insufficient documentation

## 2011-06-06 DIAGNOSIS — R599 Enlarged lymph nodes, unspecified: Secondary | ICD-10-CM | POA: Insufficient documentation

## 2011-06-06 DIAGNOSIS — J984 Other disorders of lung: Secondary | ICD-10-CM | POA: Insufficient documentation

## 2011-06-06 MED ORDER — FLUDEOXYGLUCOSE F - 18 (FDG) INJECTION
19.1000 | Freq: Once | INTRAVENOUS | Status: AC | PRN
  Administered 2011-06-06: 19.1 via INTRAVENOUS

## 2011-06-09 ENCOUNTER — Encounter (HOSPITAL_COMMUNITY)
Admission: RE | Admit: 2011-06-09 | Discharge: 2011-06-09 | Disposition: A | Discharge status: Home / Self Care | Payer: Medicare Other | Source: Ambulatory Visit | Attending: Thoracic Surgery | Admitting: Thoracic Surgery

## 2011-06-09 ENCOUNTER — Ambulatory Visit (HOSPITAL_COMMUNITY)
Admission: RE | Admit: 2011-06-09 | Discharge: 2011-06-09 | Disposition: A | Discharge status: Home / Self Care | Payer: Medicare Other | Source: Ambulatory Visit | Attending: Thoracic Surgery | Admitting: Thoracic Surgery

## 2011-06-09 ENCOUNTER — Encounter (HOSPITAL_COMMUNITY): Payer: Self-pay

## 2011-06-09 DIAGNOSIS — Z01812 Encounter for preprocedural laboratory examination: Secondary | ICD-10-CM | POA: Insufficient documentation

## 2011-06-09 DIAGNOSIS — Z01818 Encounter for other preprocedural examination: Secondary | ICD-10-CM | POA: Insufficient documentation

## 2011-06-09 DIAGNOSIS — J438 Other emphysema: Secondary | ICD-10-CM | POA: Insufficient documentation

## 2011-06-09 DIAGNOSIS — J984 Other disorders of lung: Secondary | ICD-10-CM | POA: Insufficient documentation

## 2011-06-09 DIAGNOSIS — D381 Neoplasm of uncertain behavior of trachea, bronchus and lung: Secondary | ICD-10-CM

## 2011-06-09 LAB — CBC
Hemoglobin: 15 g/dL (ref 13.0–17.0)
MCH: 29.5 pg (ref 26.0–34.0)
MCV: 84.7 fL (ref 78.0–100.0)
RBC: 5.09 MIL/uL (ref 4.22–5.81)

## 2011-06-09 LAB — COMPREHENSIVE METABOLIC PANEL
ALT: 19 U/L (ref 0–53)
CO2: 27 mEq/L (ref 19–32)
Calcium: 9.1 mg/dL (ref 8.4–10.5)
GFR calc Af Amer: 81 mL/min — ABNORMAL LOW (ref >90)
GFR calc non Af Amer: 70 mL/min — ABNORMAL LOW (ref >90)
Glucose, Bld: 111 mg/dL — ABNORMAL HIGH (ref 70–99)
Sodium: 139 mEq/L (ref 135–145)
Total Bilirubin: 0.2 mg/dL — ABNORMAL LOW (ref 0.3–1.2)

## 2011-06-09 NOTE — Pre-Procedure Instructions (Signed)
20 Ryan [name]  06/09/2011   Your procedure is scheduled on:  Jun 11, 2011  Report to [facility] at 0615 AM.  Call this number if you have problems the morning of surgery: [phone]   Remember:   Do not eat food:After Midnight.  May have clear liquids: up to 4 Hours before arrival.  Clear liquids include soda, tea, black coffee, apple or grape juice, broth.  Take these medicines the morning of surgery with A SIP OF WATER: inhaler, zyrtec,prilosec   Do not wear jewelry, make-up or nail polish.  Do not wear lotions, powders, or perfumes. You may wear deodorant.  Do not shave 48 hours prior to surgery.  Do not bring valuables to the hospital.  Contacts, dentures or bridgework may not be worn into surgery.  Leave suitcase in the car. After surgery it may be brought to your room.  For patients admitted to the hospital, checkout time is 11:00 AM the day of discharge.   Patients discharged the day of surgery will not be allowed to drive home.  Name and phone number of your driver: Cordelia Pen [phone]  Special Instructions: CHG Shower Use Special Wash: 1/2 bottle night before surgery and 1/2 bottle morning of surgery.   Please read over the following fact sheets that you were given: Pain Booklet and Surgical Site Infection Prevention

## 2011-06-10 ENCOUNTER — Telehealth (HOSPITAL_COMMUNITY): Payer: Self-pay | Admitting: Surgery

## 2011-06-11 ENCOUNTER — Ambulatory Visit (HOSPITAL_COMMUNITY)
Admission: RE | Admit: 2011-06-11 | Discharge: 2011-06-11 | Disposition: A | Discharge status: Home / Self Care | Payer: Medicare Other | Source: Ambulatory Visit | Attending: Thoracic Surgery | Admitting: Thoracic Surgery

## 2011-06-11 ENCOUNTER — Other Ambulatory Visit: Payer: Self-pay | Admitting: Thoracic Surgery

## 2011-06-11 ENCOUNTER — Ambulatory Visit (HOSPITAL_COMMUNITY): Payer: Medicare Other

## 2011-06-11 ENCOUNTER — Encounter (HOSPITAL_COMMUNITY): Payer: Self-pay

## 2011-06-11 ENCOUNTER — Encounter (HOSPITAL_COMMUNITY)
Admission: RE | Disposition: A | Discharge status: Home / Self Care | Payer: Self-pay | Source: Ambulatory Visit | Attending: Thoracic Surgery

## 2011-06-11 ENCOUNTER — Other Ambulatory Visit (HOSPITAL_COMMUNITY): Payer: Self-pay | Admitting: Oncology

## 2011-06-11 ENCOUNTER — Telehealth (HOSPITAL_COMMUNITY): Payer: Self-pay | Admitting: Oncology

## 2011-06-11 DIAGNOSIS — R52 Pain, unspecified: Secondary | ICD-10-CM

## 2011-06-11 DIAGNOSIS — D381 Neoplasm of uncertain behavior of trachea, bronchus and lung: Secondary | ICD-10-CM

## 2011-06-11 DIAGNOSIS — I4891 Unspecified atrial fibrillation: Secondary | ICD-10-CM | POA: Insufficient documentation

## 2011-06-11 DIAGNOSIS — F17200 Nicotine dependence, unspecified, uncomplicated: Secondary | ICD-10-CM | POA: Insufficient documentation

## 2011-06-11 DIAGNOSIS — J4489 Other specified chronic obstructive pulmonary disease: Secondary | ICD-10-CM | POA: Insufficient documentation

## 2011-06-11 DIAGNOSIS — I2510 Atherosclerotic heart disease of native coronary artery without angina pectoris: Secondary | ICD-10-CM | POA: Insufficient documentation

## 2011-06-11 DIAGNOSIS — G4730 Sleep apnea, unspecified: Secondary | ICD-10-CM | POA: Insufficient documentation

## 2011-06-11 DIAGNOSIS — J449 Chronic obstructive pulmonary disease, unspecified: Secondary | ICD-10-CM | POA: Insufficient documentation

## 2011-06-11 DIAGNOSIS — R222 Localized swelling, mass and lump, trunk: Secondary | ICD-10-CM | POA: Insufficient documentation

## 2011-06-11 LAB — AFB CULTURE WITH SMEAR (NOT AT ARMC): Acid Fast Smear: NONE SEEN

## 2011-06-11 SURGERY — VIDEO BRONCHOSCOPY WITH ENDOBRONCHIAL NAVIGATION
Anesthesia: General | Site: Chest | Wound class: Clean Contaminated

## 2011-06-11 MED ORDER — ONDANSETRON HCL 4 MG/2ML IJ SOLN
4.0000 mg | Freq: Four times a day (QID) | INTRAMUSCULAR | Status: DC | PRN
  Filled 2011-06-11: qty 2

## 2011-06-11 MED ORDER — HYDROMORPHONE HCL PF 1 MG/ML IJ SOLN: 0.2500 mg | INTRAMUSCULAR | Status: DC | PRN

## 2011-06-11 MED ORDER — NEOSTIGMINE METHYLSULFATE 1 MG/ML IJ SOLN
INTRAMUSCULAR | Status: DC | PRN
  Administered 2011-06-11: 4 mg via INTRAVENOUS

## 2011-06-11 MED ORDER — FENTANYL CITRATE 0.05 MG/ML IJ SOLN: 25.0000 ug | INTRAMUSCULAR | Status: DC | PRN

## 2011-06-11 MED ORDER — MEPERIDINE HCL 25 MG/ML IJ SOLN: 6.2500 mg | INTRAMUSCULAR | Status: DC | PRN

## 2011-06-11 MED ORDER — SODIUM CHLORIDE 0.9 % IR SOLN
Status: DC | PRN
  Administered 2011-06-11: 1000 mL

## 2011-06-11 MED ORDER — SODIUM CHLORIDE 0.9 % IJ SOLN: 3.0000 mL | Freq: Two times a day (BID) | INTRAMUSCULAR | Status: DC

## 2011-06-11 MED ORDER — LACTATED RINGERS IV SOLN
INTRAVENOUS | Status: DC | PRN
  Administered 2011-06-11: 08:00:00 via INTRAVENOUS

## 2011-06-11 MED ORDER — ACETAMINOPHEN 650 MG RE SUPP: 650.0000 mg | RECTAL | Status: DC | PRN

## 2011-06-11 MED ORDER — FENTANYL CITRATE 0.05 MG/ML IJ SOLN
INTRAMUSCULAR | Status: DC | PRN
  Administered 2011-06-11 (×2): 100 ug via INTRAVENOUS
  Administered 2011-06-11: 50 ug via INTRAVENOUS

## 2011-06-11 MED ORDER — PROMETHAZINE HCL 25 MG/ML IJ SOLN
12.5000 mg | Freq: Four times a day (QID) | INTRAMUSCULAR | Status: DC | PRN
  Filled 2011-06-11: qty 1

## 2011-06-11 MED ORDER — ONDANSETRON HCL 4 MG/2ML IJ SOLN
INTRAMUSCULAR | Status: DC | PRN
  Administered 2011-06-11: 4 mg via INTRAVENOUS

## 2011-06-11 MED ORDER — ACETAMINOPHEN 325 MG PO TABS: 650.0000 mg | ORAL_TABLET | ORAL | Status: DC | PRN

## 2011-06-11 MED ORDER — PROPOFOL 10 MG/ML IV EMUL
INTRAVENOUS | Status: DC | PRN
  Administered 2011-06-11: 50 mg via INTRAVENOUS
  Administered 2011-06-11: 110 mg via INTRAVENOUS

## 2011-06-11 MED ORDER — GLYCOPYRROLATE 0.2 MG/ML IJ SOLN
INTRAMUSCULAR | Status: DC | PRN
  Administered 2011-06-11: .6 mg via INTRAVENOUS

## 2011-06-11 MED ORDER — PHENYLEPHRINE HCL 10 MG/ML IJ SOLN
INTRAMUSCULAR | Status: DC | PRN
  Administered 2011-06-11 (×4): 60 ug via INTRAVENOUS

## 2011-06-11 MED ORDER — SODIUM CHLORIDE 0.9 % IJ SOLN: 3.0000 mL | INTRAMUSCULAR | Status: DC | PRN

## 2011-06-11 MED ORDER — ROCURONIUM BROMIDE 100 MG/10ML IV SOLN
INTRAVENOUS | Status: DC | PRN
  Administered 2011-06-11: 50 mg via INTRAVENOUS

## 2011-06-11 MED ORDER — SODIUM CHLORIDE 0.9 % IV SOLN: 250.0000 mL | INTRAVENOUS | Status: DC | PRN

## 2011-06-11 MED ORDER — ONDANSETRON HCL 4 MG/2ML IJ SOLN: 4.0000 mg | Freq: Once | INTRAMUSCULAR | Status: DC | PRN

## 2011-06-11 MED ORDER — MIDAZOLAM HCL 5 MG/5ML IJ SOLN
INTRAMUSCULAR | Status: DC | PRN
  Administered 2011-06-11: 2 mg via INTRAVENOUS

## 2011-06-11 MED ORDER — HYDROCODONE-ACETAMINOPHEN 5-325 MG PO TABS: 1.0000 | ORAL_TABLET | Freq: Four times a day (QID) | ORAL | Status: DC | PRN

## 2011-06-11 MED ORDER — OXYCODONE HCL 5 MG PO TABS: 5.0000 mg | ORAL_TABLET | ORAL | Status: DC | PRN

## 2011-06-11 SURGICAL SUPPLY — 33 items
BRUSH SUPERTRAX BIOPSY (INSTRUMENTS) IMPLANT
BRUSH SUPERTRAX NDL-TIP CYTO (INSTRUMENTS) ×2 IMPLANT
CANISTER SUCTION 2500CC (MISCELLANEOUS) ×2 IMPLANT
CHANNEL WORK EXTEND EDGE 180 (KITS) IMPLANT
CHANNEL WORK EXTEND EDGE 45 (KITS) IMPLANT
CHANNEL WORK EXTEND EDGE 90 (KITS) IMPLANT
CLOTH BEACON ORANGE TIMEOUT ST (SAFETY) ×2 IMPLANT
CONT SPEC 4OZ CLIKSEAL STRL BL (MISCELLANEOUS) ×4 IMPLANT
COVER TABLE BACK 60X90 (DRAPES) ×2 IMPLANT
DRAPE C-ARM 42X72 X-RAY (DRAPES) IMPLANT
FILTER STRAW FLUID ASPIR (MISCELLANEOUS) IMPLANT
FORCEPS BIOP SUPERTRX PREMAR (INSTRUMENTS) ×2 IMPLANT
GLOVE SS BIOGEL STRL SZ 6.5 (GLOVE) ×1 IMPLANT
GLOVE SUPERSENSE BIOGEL SZ 6.5 (GLOVE) ×1
GLOVE SURG SIGNA 7.5 PF LTX (GLOVE) ×2 IMPLANT
GOWN STRL NON-REIN LRG LVL3 (GOWN DISPOSABLE) ×2 IMPLANT
KIT LOCATABLE GUIDE (CANNULA) IMPLANT
KIT MARKER FIDUCIAL DELIVERY (KITS) IMPLANT
KIT PROCEDURE EDGE 180 (KITS) IMPLANT
KIT PROCEDURE EDGE 45 (KITS) IMPLANT
KIT PROCEDURE EDGE 90 (KITS) ×2 IMPLANT
KIT ROOM TURNOVER OR (KITS) ×2 IMPLANT
MARKER SKIN DUAL TIP RULER LAB (MISCELLANEOUS) ×2 IMPLANT
NEEDLE SUPERTRX PREMARK BIOPSY (NEEDLE) IMPLANT
NS IRRIG 1000ML POUR BTL (IV SOLUTION) ×2 IMPLANT
OIL SILICONE PENTAX (PARTS (SERVICE/REPAIRS)) ×2 IMPLANT
PAD ARMBOARD 7.5X6 YLW CONV (MISCELLANEOUS) ×2 IMPLANT
SPONGE GAUZE 4X4 12PLY (GAUZE/BANDAGES/DRESSINGS) ×2 IMPLANT
SYR 20ML ECCENTRIC (SYRINGE) ×2 IMPLANT
SYR 30ML LL (SYRINGE) ×2 IMPLANT
TOWEL OR 17X24 6PK STRL BLUE (TOWEL DISPOSABLE) ×2 IMPLANT
TRAP SPECIMEN MUCOUS 40CC (MISCELLANEOUS) ×2 IMPLANT
TUBE CONNECTING 12X1/4 (SUCTIONS) ×2 IMPLANT

## 2011-06-11 NOTE — Transfer of Care (Signed)
Immediate Anesthesia Transfer of Care Note  Patient: Ryan [name]  Procedure(s) Performed:  VIDEO BRONCHOSCOPY WITH ENDOBRONCHIAL NAVIGATION  Patient Location: PACU  Anesthesia Type: General  Level of Consciousness: awake, alert  and oriented  Airway & Oxygen Therapy: Patient Spontanous Breathing and Patient connected to face mask oxygen  Post-op Assessment: Report given to PACU RN, Post -op Vital signs reviewed and stable and Patient moving all extremities  Post vital signs: Reviewed and stable  Complications: No apparent anesthesia complications

## 2011-06-11 NOTE — Telephone Encounter (Signed)
Patient call me today reporting pain status post procedure performed today of biopsy of lung mass. He requests Percocet. He has Ultram on his medication list when he is reasonable to give them something else for pain for short interval time. The patient requests Percocet, but I have prescribed him hydrocodone/APAP 5/325 mg #20 with 0 refills. He states one tablet every 6 hours as needed for pain. This medication is to control postprocedural pain only.

## 2011-06-11 NOTE — Interval H&P Note (Signed)
History and Physical Interval Note:   06/11/2011   7:55 AM   Ryan [name]  has presented today for surgery, with the diagnosis of LUNG MASS  The various methods of treatment have been discussed with the patient and family. After consideration of risks, benefits and other options for treatment, the patient has consented to  Procedure(s): VIDEO BRONCHOSCOPY WITH ENDOBRONCHIAL NAVIGATION as a surgical intervention .  The patients' history has been reviewed, patient examined, no change in status, stable for surgery.  I have reviewed the patients' chart and labs.  Questions were answered to the patient's satisfaction.     Cameron Proud  MD

## 2011-06-11 NOTE — Preoperative (Signed)
Beta Blockers   Reason not to administer Beta Blockers:Not Applicable 

## 2011-06-11 NOTE — Anesthesia Procedure Notes (Signed)
Procedure Name: Intubation Date/Time: 06/11/2011 8:42 AM Performed by: Carmela Rima Pre-anesthesia Checklist: Emergency Drugs available, Patient identified, Timeout performed, Suction available and Patient being monitored Patient Re-evaluated:Patient Re-evaluated prior to inductionOxygen Delivery Method: Circle System Utilized Preoxygenation: Pre-oxygenation with 100% oxygen Intubation Type: IV induction Ventilation: Mask ventilation without difficulty Laryngoscope Size: Mac and 3 Grade View: Grade I Tube type: Oral Tube size: 8.5 mm Number of attempts: 1 Placement Confirmation: ETT inserted through vocal cords under direct vision,  breath sounds checked- equal and bilateral,  positive ETCO2 and CO2 detector (intubation by Tish Frederickson SRNA) Secured at: 23 cm Tube secured with: Tape Dental Injury: Teeth and Oropharynx as per pre-operative assessment

## 2011-06-11 NOTE — Brief Op Note (Signed)
06/11/2011  10:03 AM  PATIENT:  Ryan [name]  50 y.o. male  PRE-OPERATIVE DIAGNOSIS:  LUNG MASS  POST-OPERATIVE DIAGNOSIS:  Lung mass inflammatory  PROCEDURE:  Procedure(s): VIDEO BRONCHOSCOPY WITH ENDOBRONCHIAL NAVIGATION  SURGEON:  Surgeon(s): D Karle Plumber, MD  PHYSICIAN ASSISTANT:   ASSISTANTS: Darci Needle MD   ANESTHESIA:   general  EBL:  Total I/O In: 1800 [I.V.:1800] Out: 0   BLOOD ADMINISTERED:none  DRAINS: none   LOCAL MEDICATIONS USED:  NONE  SPECIMEN:  Aspirate  DISPOSITION OF SPECIMEN:  PATHOLOGY  COUNTS:  YES  TOURNIQUET:  * No tourniquets in log *  DICTATION: .Other Dictation: Dictation Number [id number]  PLAN OF CARE: Discharge to home after PACU  PATIENT DISPOSITION:  PACU - hemodynamically stable.   Delay start of Pharmacological VTE agent (>24hrs) due to surgical blood loss or risk of bleeding:  {YES/NO/NOT APPLICABLE:20182

## 2011-06-11 NOTE — Anesthesia Preprocedure Evaluation (Addendum)
Anesthesia Evaluation  Patient identified by MRN, date of birth, ID band Patient awake and Patient confused    Reviewed: Allergy & Precautions, H&P , NPO status , Patient's Chart, lab work & pertinent test results, reviewed documented beta blocker date and time   Airway Mallampati: II TM Distance: >3 FB Neck ROM: full    Dental  (+) Edentulous Upper and Edentulous Lower   Pulmonary asthma , sleep apnea , pneumonia , COPD COPD inhaler,    + wheezing      Cardiovascular + CAD + dysrhythmias Atrial Fibrillation regular Normal    Neuro/Psych Seizures -, Well Controlled,     GI/Hepatic   Endo/Other    Renal/GU      Musculoskeletal   Abdominal   Peds  Hematology   Anesthesia Other Findings   Reproductive/Obstetrics                         Anesthesia Physical Anesthesia Plan  ASA: III  Anesthesia Plan: General   Post-op Pain Management:    Induction: Intravenous  Airway Management Planned: Oral ETT  Additional Equipment:   Intra-op Plan:   Post-operative Plan: Extubation in OR  Informed Consent: I have reviewed the patients History and Physical, chart, labs and discussed the procedure including the risks, benefits and alternatives for the proposed anesthesia with the patient or authorized representative who has indicated his/her understanding and acceptance.     Plan Discussed with: CRNA and Surgeon  Anesthesia Plan Comments:         Anesthesia Quick Evaluation

## 2011-06-11 NOTE — H&P (View-Only) (Signed)
PCP is WONG,FRANCIS PATRICK, MD, MD Referring Provider is Neijstrom, Eric S, MD  Chief Complaint  Patient presents with  . Lung Mass    Referral from Dr Neijstrom for surgical eval, Lung Mass, CT Chest 05/29/11     HPI: The patient is been followed for an enlarging left upper lobe lung mass or infiltrate. He has severe chronic obstructive pulmonary disease. He quit smoking 2 weeks ago. He's had no marked this is a says he had some fever and chills. This chest x-ray shows a regular left upper lobe posterior segment no mass with some cavitation. He has had no weight loss. We plan to get a PET scan on him. Have scheduled him for bronchoscopy with electromagnetic navigation. Risk of the procedure were explained to patient and made chance of success is moderate. Patient understands risk and agrees to the surgery.   Past Medical History  Diagnosis Date  . Emphysema   . COPD (chronic obstructive pulmonary disease)   . Hypercholesteremia   . Seizures   . Pneumonia   . Polycythemia   . Coronary artery disease   . Cavitary lesion of lung 05/07/2011  . Borderline diabetes     Past Surgical History  Procedure Date  . Angioplasty   . Colonoscopy w/ endoscopic [place]   . Vasectomy   . Throat biopsy     Family History  Problem Relation Age of Onset  . Hypertension Mother   . Diabetes Mother   . Heart attack Mother   . Heart failure Mother   . Hypertension Sister   . Diabetes Sister   . Heart failure Sister     Social History History  Substance Use Topics  . Smoking status: Former Smoker -- 0.5 packs/day for 45 years    Types: Cigarettes    Quit date: 05/25/2011  . Smokeless tobacco: Former User  . Alcohol Use: No    Current Outpatient Prescriptions  Medication Sig Dispense Refill  . albuterol (VENTOLIN HFA) 108 (90 BASE) MCG/ACT inhaler Inhale 2 puffs into the lungs every 6 (six) hours as needed. Shortness of breath      . aspirin 81 MG tablet Take 81 mg by mouth daily.          . buPROPion (WELLBUTRIN SR) 150 MG 12 hr tablet Take 150 mg by mouth 2 (two) times daily.        . cetirizine (ZYRTEC) 10 MG tablet Take 10 mg by mouth daily.        . Fluticasone-Salmeterol (ADVAIR DISKUS) 250-50 MCG/DOSE AEPB Inhale 1 puff into the lungs every 12 (twelve) hours.        . omeprazole (PRILOSEC) 20 MG capsule Take 20 mg by mouth daily.        . simvastatin (ZOCOR) 20 MG tablet Take 20 mg by mouth at bedtime.        . traMADol (ULTRAM) 50 MG tablet Take 1 tablet (50 mg total) by mouth every 6 (six) hours as needed for pain. Maximum dose= 8 tablets per day  45 tablet  0    Allergies  Allergen Reactions  . Influenza Vaccine Live Swelling    Review of Systems  Constitutional: Positive for fever and chills. Negative for unexpected weight change.  HENT: Negative.   Eyes: Negative.   Respiratory: Negative for cough, chest tightness and wheezing.   Cardiovascular: Negative.   Gastrointestinal: Negative.   Genitourinary: Negative.   Musculoskeletal: Negative.   Neurological: Positive for seizures.  Hematological: Negative.     Psychiatric/Behavioral: Negative.     BP 114/81  Pulse 98  Resp 20  Ht 5' 8" (1.727 m)  Wt 140 lb (63.504 kg)  BMI 21.29 kg/m2  SpO2 92% Physical Exam  Constitutional: He appears well-developed and well-nourished.  HENT:  Head: Normocephalic.  Right Ear: External ear normal.  Left Ear: External ear normal.  Eyes: EOM are normal. Pupils are equal, round, and reactive to light.  Neck: Normal range of motion. Neck supple.  Cardiovascular: Normal rate, normal heart sounds and intact distal pulses.   Pulmonary/Chest: Effort normal and breath sounds normal. No respiratory distress. He has no wheezes.  Abdominal: Soft. Bowel sounds are normal.  Musculoskeletal: Normal range of motion.  Lymphadenopathy:    He has no cervical adenopathy.  Neurological: He is alert. He has normal reflexes.  Skin: Skin is warm.  Psychiatric: He has a normal mood  and affect. His behavior is normal. Judgment and thought content normal.     Diagnostic Tests: CT scan shows an enlarging cavitary mass left upper lobe chronic obstructive pulmonary disease  Impression: Left upper lobe mass cancer versus inflammatory   Plan PET scan bronchoscopy with electromagnetic navigation on November 28:  

## 2011-06-11 NOTE — Anesthesia Postprocedure Evaluation (Signed)
  Anesthesia Post-op Note  Patient: Ryan [name]  Procedure(s) Performed:  VIDEO BRONCHOSCOPY WITH ENDOBRONCHIAL NAVIGATION  Patient Location: PACU  Anesthesia Type: General  Level of Consciousness: awake  Airway and Oxygen Therapy: Patient Spontanous Breathing  Post-op Pain: none  Post-op Assessment: Post-op Vital signs reviewed  Post-op Vital Signs: stable  Complications: No apparent anesthesia complications

## 2011-06-12 LAB — FUNGAL STAIN

## 2011-06-12 LAB — AFB STAIN: Special Requests: 1

## 2011-06-12 NOTE — Op Note (Signed)
NAMECORLEY, MAFFEO                 ACCOUNT NO.:  [id number]  MEDICAL RECORD NO.:  [id number]  LOCATION:  [facility]                         FACILITY:  [facility]  PHYSICIAN:  Ines Bloomer, M.D. DATE OF BIRTH:  04-30-1961  DATE OF PROCEDURE: DATE OF DISCHARGE:                              OPERATIVE REPORT   PREOPERATIVE DIAGNOSIS:  Left upper lobe mass.  POSTOPERATIVE DIAGNOSIS:  Left upper lobe mass.  OPERATION PERFORMED:  Fiberoptic bronchoscopy with endobronchial ultrasound.  SURGEON:  Ines Bloomer, M.D.  FIRST ASSISTANT:  Oretha Milch, MD.  ANESTHESIA:  General anesthesia.  DESCRIPTION OF PROCEDURE:  After general anesthesia, the video bronchoscope was passed through the endotracheal tube.  The carina was in midline.  The right upper lobe, right middle lobe, right lower lobe orifices were normal.  The left upper lobe, left lower lobe orifices were normal.  The video bronchoscope was removed, and we then inserted the working channel into the video bronchoscope with locatable guide and then reinserted the bronchoscope.  We then did an automatic registration without any difficulty and then proceeded to do electromagnetic navigational bronchoscopies navigating to the left upper lobe and then navigating out to the within 1 to 1.5 cm of the lesion.  After that was done through the extended working, the locatable guide was removed through the extended working channel, and under fluoro guidance, we did brushings and biopsies, and more brushings, and then finally BAL through the extended working channel.  They saw histiocytes and multiple nuclear giant cells on the specimen.  Cultures were also sent for fungus and AFB.  We then sent bronchial washings also for fungus and AFB.  The extended working channel was removed, and we checked for pneumo and none was seen, and the scope was removed.  The patient tolerated procedure well and was turned to recovery room in stable  condition.     Ines Bloomer, M.D.     DPB/MEDQ  D:  06/11/2011  T:  06/11/2011  Job:  [id number]

## 2011-06-13 LAB — CULTURE, RESPIRATORY W GRAM STAIN

## 2011-06-13 LAB — CULTURE, BAL-QUANTITATIVE W GRAM STAIN
Colony Count: NO GROWTH
Culture: NO GROWTH

## 2011-06-17 ENCOUNTER — Encounter: Payer: Self-pay | Admitting: Thoracic Surgery

## 2011-06-17 ENCOUNTER — Ambulatory Visit
Admission: RE | Admit: 2011-06-17 | Discharge: 2011-06-17 | Disposition: A | Discharge status: Home / Self Care | Payer: Medicare Other | Source: Ambulatory Visit | Attending: Thoracic Surgery | Admitting: Thoracic Surgery

## 2011-06-17 ENCOUNTER — Other Ambulatory Visit: Payer: Self-pay | Admitting: Thoracic Surgery

## 2011-06-17 ENCOUNTER — Ambulatory Visit (INDEPENDENT_AMBULATORY_CARE_PROVIDER_SITE_OTHER): Payer: Medicare Other | Admitting: Thoracic Surgery

## 2011-06-17 VITALS — BP 113/77 | HR 102 | Resp 18 | Ht 68.0 in | Wt 140.0 lb

## 2011-06-17 DIAGNOSIS — D381 Neoplasm of uncertain behavior of trachea, bronchus and lung: Secondary | ICD-10-CM

## 2011-06-17 DIAGNOSIS — D491 Neoplasm of unspecified behavior of respiratory system: Secondary | ICD-10-CM

## 2011-06-17 NOTE — Progress Notes (Signed)
HPI the patient returns for followup. I have electromagnetic bronchoscopy. Results revealed multinucleated giant cells and histiocytes which would go along with an inflammatory process of his left upper lobe lesion. We took cultures at that time and sewed for the cultures to come back. I explained this to the patient and his wife they agree with this plan. I will explain to them that there still could possibly cancer but to the tests point toward being an inflammatory or infectious process. I will see him back in the weeks with a chest x-ray   Current Outpatient Prescriptions  Medication Sig Dispense Refill  . albuterol (VENTOLIN HFA) 108 (90 BASE) MCG/ACT inhaler Inhale 2 puffs into the lungs every 4 (four) hours as needed. Shortness of breath      . aspirin 81 MG tablet Take 81 mg by mouth daily.       [name] buPROPion (WELLBUTRIN SR) 150 MG 12 hr tablet Take 150 mg by mouth 2 (two) times daily.        . cetirizine (ZYRTEC) 10 MG tablet Take 10 mg by mouth daily.       . Fluticasone-Salmeterol (ADVAIR DISKUS) 250-50 MCG/DOSE AEPB Inhale 1 puff into the lungs every 12 (twelve) hours.       [name] HYDROcodone-acetaminophen (NORCO) 5-325 MG per tablet Take 1 tablet by mouth every 6 (six) hours as needed for pain.  20 tablet  0  . omeprazole (PRILOSEC) 20 MG capsule Take 20 mg by mouth at bedtime.       . simvastatin (ZOCOR) 20 MG tablet Take 20 mg by mouth at bedtime.       . traMADol (ULTRAM) 50 MG tablet Take 50 mg by mouth every 6 (six) hours as needed. For pain. Maximum dose= 8 tablets per day          Review of Systems unchanged:   Physical Exam lungs are clear bilaterally   Diagnostic Tests: Biopsy revealed histiocytes and multinucleated giant cells cultures pending   Impression:  Left upper lobe mass chronic obstructive pulmonary disease bullous emphysema   Plan: Return in 4 weeks with chest x-ray await culture

## 2011-06-20 ENCOUNTER — Encounter: Payer: Self-pay | Admitting: Adult Health

## 2011-06-22 ENCOUNTER — Emergency Department (HOSPITAL_COMMUNITY): Payer: Medicare Other

## 2011-06-22 ENCOUNTER — Other Ambulatory Visit: Payer: Self-pay

## 2011-06-22 ENCOUNTER — Inpatient Hospital Stay (HOSPITAL_COMMUNITY)
Admission: EM | Admit: 2011-06-22 | Discharge: 2011-06-24 | DRG: 190 | Disposition: A | Discharge status: Home / Self Care | Payer: Medicare Other | Attending: Internal Medicine | Admitting: Internal Medicine

## 2011-06-22 ENCOUNTER — Encounter (HOSPITAL_COMMUNITY): Payer: Self-pay | Admitting: *Deleted

## 2011-06-22 DIAGNOSIS — F17200 Nicotine dependence, unspecified, uncomplicated: Secondary | ICD-10-CM | POA: Diagnosis present

## 2011-06-22 DIAGNOSIS — F3289 Other specified depressive episodes: Secondary | ICD-10-CM | POA: Diagnosis present

## 2011-06-22 DIAGNOSIS — R05 Cough: Secondary | ICD-10-CM | POA: Diagnosis present

## 2011-06-22 DIAGNOSIS — J440 Chronic obstructive pulmonary disease with acute lower respiratory infection: Principal | ICD-10-CM | POA: Diagnosis present

## 2011-06-22 DIAGNOSIS — R197 Diarrhea, unspecified: Secondary | ICD-10-CM

## 2011-06-22 DIAGNOSIS — G4730 Sleep apnea, unspecified: Secondary | ICD-10-CM | POA: Insufficient documentation

## 2011-06-22 DIAGNOSIS — F329 Major depressive disorder, single episode, unspecified: Secondary | ICD-10-CM | POA: Diagnosis present

## 2011-06-22 DIAGNOSIS — I2510 Atherosclerotic heart disease of native coronary artery without angina pectoris: Secondary | ICD-10-CM | POA: Diagnosis present

## 2011-06-22 DIAGNOSIS — R7309 Other abnormal glucose: Secondary | ICD-10-CM | POA: Diagnosis present

## 2011-06-22 DIAGNOSIS — J9601 Acute respiratory failure with hypoxia: Secondary | ICD-10-CM | POA: Diagnosis present

## 2011-06-22 DIAGNOSIS — IMO0002 Reserved for concepts with insufficient information to code with codable children: Secondary | ICD-10-CM

## 2011-06-22 DIAGNOSIS — R109 Unspecified abdominal pain: Secondary | ICD-10-CM | POA: Diagnosis present

## 2011-06-22 DIAGNOSIS — J984 Other disorders of lung: Secondary | ICD-10-CM

## 2011-06-22 DIAGNOSIS — R222 Localized swelling, mass and lump, trunk: Secondary | ICD-10-CM | POA: Diagnosis present

## 2011-06-22 DIAGNOSIS — Z79899 Other long term (current) drug therapy: Secondary | ICD-10-CM

## 2011-06-22 DIAGNOSIS — I4891 Unspecified atrial fibrillation: Secondary | ICD-10-CM

## 2011-06-22 DIAGNOSIS — Z888 Allergy status to other drugs, medicaments and biological substances status: Secondary | ICD-10-CM

## 2011-06-22 DIAGNOSIS — J449 Chronic obstructive pulmonary disease, unspecified: Secondary | ICD-10-CM

## 2011-06-22 DIAGNOSIS — R0602 Shortness of breath: Secondary | ICD-10-CM | POA: Diagnosis present

## 2011-06-22 DIAGNOSIS — R059 Cough, unspecified: Secondary | ICD-10-CM | POA: Diagnosis present

## 2011-06-22 DIAGNOSIS — Z7982 Long term (current) use of aspirin: Secondary | ICD-10-CM

## 2011-06-22 DIAGNOSIS — E7800 Pure hypercholesterolemia, unspecified: Secondary | ICD-10-CM | POA: Diagnosis present

## 2011-06-22 DIAGNOSIS — J9600 Acute respiratory failure, unspecified whether with hypoxia or hypercapnia: Secondary | ICD-10-CM | POA: Diagnosis present

## 2011-06-22 DIAGNOSIS — R112 Nausea with vomiting, unspecified: Secondary | ICD-10-CM | POA: Diagnosis present

## 2011-06-22 DIAGNOSIS — J209 Acute bronchitis, unspecified: Principal | ICD-10-CM | POA: Diagnosis present

## 2011-06-22 DIAGNOSIS — J45909 Unspecified asthma, uncomplicated: Secondary | ICD-10-CM

## 2011-06-22 DIAGNOSIS — E785 Hyperlipidemia, unspecified: Secondary | ICD-10-CM | POA: Diagnosis present

## 2011-06-22 DIAGNOSIS — K219 Gastro-esophageal reflux disease without esophagitis: Secondary | ICD-10-CM | POA: Diagnosis present

## 2011-06-22 DIAGNOSIS — J441 Chronic obstructive pulmonary disease with (acute) exacerbation: Secondary | ICD-10-CM | POA: Diagnosis present

## 2011-06-22 DIAGNOSIS — E860 Dehydration: Secondary | ICD-10-CM | POA: Diagnosis present

## 2011-06-22 DIAGNOSIS — G40909 Epilepsy, unspecified, not intractable, without status epilepticus: Secondary | ICD-10-CM | POA: Diagnosis present

## 2011-06-22 HISTORY — DX: Gastro-esophageal reflux disease without esophagitis: K21.9

## 2011-06-22 LAB — URINALYSIS, ROUTINE W REFLEX MICROSCOPIC
Bilirubin Urine: NEGATIVE
Hgb urine dipstick: NEGATIVE
Specific Gravity, Urine: >1.03 — ABNORMAL HIGH (ref 1.005–1.030)
pH: 6 (ref 5.0–8.0)

## 2011-06-22 LAB — CBC
HCT: 43.1 % (ref 39.0–52.0)
MCV: 82.9 fL (ref 78.0–100.0)
RBC: 5.2 MIL/uL (ref 4.22–5.81)
WBC: 10.5 10*3/uL (ref 4.0–10.5)

## 2011-06-22 LAB — POCT I-STAT, CHEM 8
Calcium, Ion: 1.11 mmol/L — ABNORMAL LOW (ref 1.12–1.32)
HCT: 45 % (ref 39.0–52.0)
TCO2: 25 mmol/L (ref 0–100)

## 2011-06-22 LAB — SURGICAL PCR SCREEN: Staphylococcus aureus: INVALID — AB

## 2011-06-22 LAB — POCT I-STAT TROPONIN I: Troponin i, poc: 0.01 ng/mL (ref 0.00–0.08)

## 2011-06-22 MED ORDER — ALUM & MAG HYDROXIDE-SIMETH 200-200-20 MG/5ML PO SUSP
30.0000 mL | Freq: Four times a day (QID) | ORAL | Status: DC | PRN
  Administered 2011-06-22: 30 mL via ORAL
  Filled 2011-06-22: qty 30

## 2011-06-22 MED ORDER — IPRATROPIUM BROMIDE 0.02 % IN SOLN
0.5000 mg | Freq: Once | RESPIRATORY_TRACT | Status: AC
  Administered 2011-06-22: 0.5 mg via RESPIRATORY_TRACT
  Filled 2011-06-22: qty 2.5

## 2011-06-22 MED ORDER — ONDANSETRON HCL 4 MG/2ML IJ SOLN
4.0000 mg | Freq: Four times a day (QID) | INTRAMUSCULAR | Status: DC | PRN
  Administered 2011-06-22: 4 mg via INTRAVENOUS
  Filled 2011-06-22: qty 2

## 2011-06-22 MED ORDER — GI COCKTAIL ~~LOC~~
30.0000 mL | Freq: Once | ORAL | Status: AC
  Administered 2011-06-22: 30 mL via ORAL
  Filled 2011-06-22: qty 30

## 2011-06-22 MED ORDER — SODIUM CHLORIDE 0.9 % IV SOLN
INTRAVENOUS | Status: DC
  Administered 2011-06-22 – 2011-06-23 (×4): via INTRAVENOUS

## 2011-06-22 MED ORDER — ACETAMINOPHEN 650 MG RE SUPP: 650.0000 mg | Freq: Four times a day (QID) | RECTAL | Status: DC | PRN

## 2011-06-22 MED ORDER — LEVOFLOXACIN IN D5W 500 MG/100ML IV SOLN
500.0000 mg | Freq: Once | INTRAVENOUS | Status: AC
  Administered 2011-06-22: 500 mg via INTRAVENOUS
  Filled 2011-06-22: qty 100

## 2011-06-22 MED ORDER — LORATADINE 10 MG PO TABS
10.0000 mg | ORAL_TABLET | Freq: Every day | ORAL | Status: DC
  Administered 2011-06-22 – 2011-06-24 (×3): 10 mg via ORAL
  Filled 2011-06-22 (×3): qty 1

## 2011-06-22 MED ORDER — ALBUTEROL SULFATE (5 MG/ML) 0.5% IN NEBU
2.5000 mg | INHALATION_SOLUTION | Freq: Once | RESPIRATORY_TRACT | Status: AC
  Administered 2011-06-22: 2.5 mg via RESPIRATORY_TRACT
  Filled 2011-06-22: qty 0.5

## 2011-06-22 MED ORDER — POLYETHYLENE GLYCOL 3350 17 G PO PACK
17.0000 g | PACK | Freq: Every day | ORAL | Status: DC | PRN
  Filled 2011-06-22: qty 1

## 2011-06-22 MED ORDER — FENTANYL CITRATE 0.05 MG/ML IJ SOLN
50.0000 ug | Freq: Once | INTRAMUSCULAR | Status: AC
  Administered 2011-06-22: 50 ug via INTRAVENOUS
  Filled 2011-06-22: qty 2

## 2011-06-22 MED ORDER — SIMVASTATIN 20 MG PO TABS
20.0000 mg | ORAL_TABLET | Freq: Every day | ORAL | Status: DC
  Administered 2011-06-22 – 2011-06-23 (×2): 20 mg via ORAL
  Filled 2011-06-22 (×3): qty 1

## 2011-06-22 MED ORDER — PANTOPRAZOLE SODIUM 40 MG PO TBEC
40.0000 mg | DELAYED_RELEASE_TABLET | Freq: Two times a day (BID) | ORAL | Status: DC
  Administered 2011-06-23 – 2011-06-24 (×2): 40 mg via ORAL
  Filled 2011-06-22 (×2): qty 1

## 2011-06-22 MED ORDER — GUAIFENESIN ER 600 MG PO TB12
600.0000 mg | ORAL_TABLET | Freq: Two times a day (BID) | ORAL | Status: DC
  Administered 2011-06-22 – 2011-06-24 (×4): 600 mg via ORAL
  Filled 2011-06-22 (×5): qty 1

## 2011-06-22 MED ORDER — DEXTROSE 5 % IV SOLN
2.0000 g | Freq: Once | INTRAVENOUS | Status: AC
  Administered 2011-06-22: 2 g via INTRAVENOUS
  Filled 2011-06-22 (×2): qty 2

## 2011-06-22 MED ORDER — FLUTICASONE-SALMETEROL 250-50 MCG/DOSE IN AEPB
1.0000 | INHALATION_SPRAY | Freq: Two times a day (BID) | RESPIRATORY_TRACT | Status: DC
  Administered 2011-06-23 – 2011-06-24 (×3): 1 via RESPIRATORY_TRACT
  Filled 2011-06-22 (×2): qty 14

## 2011-06-22 MED ORDER — PREDNISONE 10 MG PO TABS
10.0000 mg | ORAL_TABLET | Freq: Every day | ORAL | Status: DC
  Administered 2011-06-23 – 2011-06-24 (×2): 10 mg via ORAL
  Filled 2011-06-22 (×3): qty 1

## 2011-06-22 MED ORDER — OXYCODONE HCL 5 MG PO TABS
5.0000 mg | ORAL_TABLET | ORAL | Status: DC | PRN
  Administered 2011-06-22 – 2011-06-24 (×5): 5 mg via ORAL
  Filled 2011-06-22 (×5): qty 1

## 2011-06-22 MED ORDER — ASPIRIN EC 81 MG PO TBEC
81.0000 mg | DELAYED_RELEASE_TABLET | Freq: Every day | ORAL | Status: DC
  Administered 2011-06-22 – 2011-06-24 (×3): 81 mg via ORAL
  Filled 2011-06-22 (×3): qty 1

## 2011-06-22 MED ORDER — ACETAMINOPHEN 325 MG PO TABS: 650.0000 mg | ORAL_TABLET | Freq: Four times a day (QID) | ORAL | Status: DC | PRN

## 2011-06-22 MED ORDER — BUPROPION HCL ER (SR) 150 MG PO TB12
150.0000 mg | ORAL_TABLET | Freq: Two times a day (BID) | ORAL | Status: DC
  Administered 2011-06-22 – 2011-06-24 (×4): 150 mg via ORAL
  Filled 2011-06-22 (×5): qty 1

## 2011-06-22 MED ORDER — ASPIRIN 81 MG PO TABS: 81.0000 mg | ORAL_TABLET | Freq: Every day | ORAL | Status: DC

## 2011-06-22 MED ORDER — ALBUTEROL SULFATE (5 MG/ML) 0.5% IN NEBU
5.0000 mg | INHALATION_SOLUTION | Freq: Once | RESPIRATORY_TRACT | Status: AC
  Administered 2011-06-22: 5 mg via RESPIRATORY_TRACT
  Filled 2011-06-22: qty 1

## 2011-06-22 MED ORDER — MOXIFLOXACIN HCL IN NACL 400 MG/250ML IV SOLN
400.0000 mg | INTRAVENOUS | Status: DC
  Administered 2011-06-23: 400 mg via INTRAVENOUS
  Filled 2011-06-22 (×2): qty 250

## 2011-06-22 MED ORDER — ALBUTEROL SULFATE (5 MG/ML) 0.5% IN NEBU: 2.5000 mg | INHALATION_SOLUTION | Freq: Four times a day (QID) | RESPIRATORY_TRACT | Status: DC | PRN

## 2011-06-22 MED ORDER — MORPHINE SULFATE 2 MG/ML IJ SOLN
1.0000 mg | INTRAMUSCULAR | Status: DC | PRN
  Administered 2011-06-23 – 2011-06-24 (×4): 1 mg via INTRAVENOUS
  Filled 2011-06-22 (×4): qty 1

## 2011-06-22 MED ORDER — HYDROMORPHONE HCL PF 1 MG/ML IJ SOLN
1.0000 mg | Freq: Once | INTRAMUSCULAR | Status: AC
  Administered 2011-06-22: 1 mg via INTRAVENOUS
  Filled 2011-06-22: qty 1

## 2011-06-22 NOTE — ED Notes (Signed)
Pt c/o cough, chills, fever, headache, back pain, left sided abdominal pain, and n/v since Tuesday.

## 2011-06-22 NOTE — ED Provider Notes (Addendum)
History     CSN: [id number] Arrival date & time: 06/22/2011  3:33 AM   First MD Initiated Contact with Patient 06/22/11 [phone]      Chief Complaint  Patient presents with  . Cough  . Shortness of Breath  . Fever  . Chills  . Nausea  . Emesis  . Abdominal Pain    (Consider location/radiation/quality/duration/timing/severity/associated sxs/prior treatment) Patient is a 50 y.o. male presenting with cough, shortness of breath, fever, vomiting, and abdominal pain. The history is provided by the patient. No language interpreter was used.  Cough This is a recurrent problem. The current episode started more than 2 days ago. The problem occurs constantly. The problem has not changed since onset.The cough is productive of purulent sputum. There has been no fever. Associated symptoms include chest pain, shortness of breath and wheezing. Pertinent negatives include no chills, no sweats, no weight loss, no ear congestion, no ear pain, no headaches, no rhinorrhea, no sore throat, no myalgias and no eye redness. He has tried nothing for the symptoms. The treatment provided no relief. He is a smoker. His past medical history is significant for COPD and emphysema.  Shortness of Breath  Associated symptoms include chest pain, a fever, cough, shortness of breath and wheezing. Pertinent negatives include no rhinorrhea and no sore throat.  Fever Primary symptoms of the febrile illness include fever, cough, wheezing, shortness of breath, abdominal pain, vomiting and diarrhea. Primary symptoms do not include fatigue, headaches, dysuria or myalgias.  The patient's medical history is significant for COPD.  The patient's medical history is significant for COPD.  Emesis  Associated symptoms include abdominal pain, cough, diarrhea and a fever. Pertinent negatives include no chills, no headaches, no myalgias and no sweats.  Abdominal Pain The primary symptoms of the illness include abdominal pain, fever, shortness  of breath, vomiting and diarrhea. The primary symptoms of the illness do not include fatigue, hematochezia or dysuria. The current episode started more than 2 days ago. The onset of the illness was gradual. The problem has not changed since onset. The patient's medical history is significant for COPD.  Associated with: nothing. The patient has had a change in bowel habit. Symptoms associated with the illness do not include chills, anorexia, diaphoresis, heartburn, constipation, urgency, hematuria or frequency. Significant associated medical issues do not include PUD, GERD, inflammatory bowel disease, diabetes or HIV.  Pain is sharp in the LLQ no radiation and an 8/10, constant and associated with n/v/ diarrhea.   Cough is productive of yellow sputum  Past Medical History  Diagnosis Date  . Emphysema   . COPD (chronic obstructive pulmonary disease)   . Hypercholesteremia   . Pneumonia   . Polycythemia   . Coronary artery disease   . Cavitary lesion of lung 05/07/2011  . Borderline diabetes   . Seizures     last seizure 2 yrs ago    Past Surgical History  Procedure Date  . Angioplasty   . Colonoscopy w/ endoscopic [place]   . Vasectomy   . Throat biopsy   . Lung biopsy     Family History  Problem Relation Age of Onset  . Hypertension Mother   . Diabetes Mother   . Heart attack Mother   . Heart failure Mother   . Hypertension Sister   . Diabetes Sister   . Heart failure Sister     History  Substance Use Topics  . Smoking status: Current Everyday Smoker -- 0.2 packs/day for 45 years  Types: Cigarettes  . Smokeless tobacco: Former Neurosurgeon  . Alcohol Use: No      Review of Systems  Constitutional: Positive for fever. Negative for chills, weight loss, diaphoresis and fatigue.  HENT: Negative for ear pain, sore throat, facial swelling and rhinorrhea.   Eyes: Negative for redness.  Respiratory: Positive for cough, shortness of breath and wheezing.   Cardiovascular: Positive  for chest pain.  Gastrointestinal: Positive for vomiting, abdominal pain and diarrhea. Negative for heartburn, constipation, hematochezia and anorexia.  Genitourinary: Negative for dysuria, urgency, frequency and hematuria.  Musculoskeletal: Negative for myalgias.  Neurological: Negative for headaches.  Hematological: Negative.   Psychiatric/Behavioral: Negative.     Allergies  Influenza vaccine live  Home Medications   Current Outpatient Rx  Name Route Sig Dispense Refill  . ALBUTEROL SULFATE HFA 108 (90 BASE) MCG/ACT IN AERS Inhalation Inhale 2 puffs into the lungs every 4 (four) hours as needed. Shortness of breath    . ASPIRIN 81 MG PO TABS Oral Take 81 mg by mouth daily.     . BUPROPION HCL ER (SR) 150 MG PO TB12 Oral Take 150 mg by mouth 2 (two) times daily.      [name] CETIRIZINE HCL 10 MG PO TABS Oral Take 10 mg by mouth daily.     [name] FLUTICASONE-SALMETEROL 250-50 MCG/DOSE IN AEPB Inhalation Inhale 1 puff into the lungs every 12 (twelve) hours.     . OMEPRAZOLE 20 MG PO CPDR Oral Take 20 mg by mouth at bedtime.     [name] SIMVASTATIN 20 MG PO TABS Oral Take 20 mg by mouth at bedtime.       BP 122/88  Pulse 98  Temp(Src) 98.3 F (36.8 C) (Oral)  Resp 22  Ht 5\' 8"  (1.727 m)  Wt 140 lb (63.504 kg)  BMI 21.29 kg/m2  SpO2 96%  Physical Exam  Constitutional: He is oriented to person, place, and time. He appears well-developed and well-nourished.  HENT:  Head: Normocephalic and atraumatic.  Mouth/Throat: Oropharynx is clear and moist.  Eyes: EOM are normal. Pupils are equal, round, and reactive to light.  Neck: Normal range of motion. Neck supple. No tracheal deviation present.  Cardiovascular: Normal rate and regular rhythm.   Pulmonary/Chest: He has wheezes.  Abdominal: Soft. Bowel sounds are normal. There is no rebound and no guarding.       LLQ tenderness minimal   Musculoskeletal: Normal range of motion. He exhibits no edema.  Neurological: He is alert and oriented to  person, place, and time.  Skin: Skin is warm and dry. He is not diaphoretic.  Psychiatric: Thought content normal.    ED Course  Procedures (including critical care time)   Labs Reviewed  CBC  I-STAT, CHEM 8  I-STAT TROPONIN I  URINALYSIS, ROUTINE W REFLEX MICROSCOPIC  URINE CULTURE   No results found.   No diagnosis found.    MDM   Date: 06/22/2011  Rate: 86  Rhythm: normal sinus rhythm  QRS Axis: normal  Intervals: normal  ST/T Wave abnormalities: normal  Conduction Disutrbances:none  Narrative Interpretation:   Old EKG Reviewed: unchanged  717 Hospitalist to review patient records Dr. Kerry Hough declines antibiotics at this time   MDM Reviewed: nursing note, vitals and previous chart Interpretation: ECG, x-ray and labs Consults: admitting MD    The patient appears reasonably stabilized for admission considering the current resources, flow, and capabilities available in the ED at this time, and I doubt any other [facility] requiring further screening and/or  treatment in the ED prior to admission.   Jasmine Awe, MD 06/22/11 [id number]  Eiliyah Reh K Kimbree Casanas-Rasch, MD 06/22/11 [phone]

## 2011-06-22 NOTE — ED Notes (Addendum)
Pt reporting cough, congestion and chills.  Also reporting back and stomach pain.  Pt reports productive cough. Reports that he had previously had a fever, temperature normal at this time.  No acute distress noted.

## 2011-06-22 NOTE — ED Notes (Signed)
Pt has requested something to eat. Pts family is going to get food. edp has given ok for pt to eat.

## 2011-06-22 NOTE — H&P (Signed)
PCP:   [name], MD, MD   Chief Complaint:  Shortness of breath, wheezing, fever/chills, productive cough.  HPI: 50 year old male with a past medical history significant for COPD/emphysema, hyperlipidemia, history of nonobstructive coronary artery disease and recent diagnosed with left upper lung mass/cavitary lesion (status post bronchoscopy/biopsy by Dr. Edwyna Shell on 11/28); who came into the hospital complaining of increased shortness of breath/wheezing, productive cough and fever/chills since Tuesday (05/18/11). Patient reports associated  Abdominal pain,nausea, vomiting and inability to keep things down. He denies hematemesis, melena/hematochezia, hemoptysis. Patient presented with these symptoms to [facility] and because of findings on the patient's x-ray and history of cavitary lesion he was transferred to [facility] to rule out TB. Also to involve Dr. Edwyna Shell who has been seeing the patient as an outpatient for further evaluation and treatment of his condition. At this point patient's culture blood and alveolar cultures from his bronchoscopy are negative for AFB/fungal microorganism.  Allergies:   Allergies  Allergen Reactions  . Influenza Vaccine Live Swelling      Past Medical History  Diagnosis Date  . Emphysema   . COPD (chronic obstructive pulmonary disease)   . Hypercholesteremia   . Pneumonia   . Polycythemia   . Coronary artery disease   . Cavitary lesion of lung 05/07/2011  . Borderline diabetes   . Seizures     last seizure 2 yrs ago  . GERD (gastroesophageal reflux disease)     Past Surgical History  Procedure Date  . Angioplasty   . Colonoscopy w/ endoscopic [place]   . Vasectomy   . Throat biopsy   . Lung biopsy   . Vasectomy 1987  . Cardiac catheterization     Prior to Admission medications   Medication Sig Start Date End Date Taking? Authorizing Provider  albuterol (VENTOLIN HFA) 108 (90 BASE) MCG/ACT inhaler Inhale 2 puffs into  the lungs every 4 (four) hours as needed. Shortness of breath   Yes Historical Provider, MD  aspirin 81 MG tablet Take 81 mg by mouth daily.    Yes Historical Provider, MD  buPROPion (WELLBUTRIN SR) 150 MG 12 hr tablet Take 150 mg by mouth 2 (two) times daily.     Yes Historical Provider, MD  cetirizine (ZYRTEC) 10 MG tablet Take 10 mg by mouth daily.    Yes Historical Provider, MD  Fluticasone-Salmeterol (ADVAIR DISKUS) 250-50 MCG/DOSE AEPB Inhale 1 puff into the lungs every 12 (twelve) hours.    Yes Historical Provider, MD  omeprazole (PRILOSEC) 20 MG capsule Take 20 mg by mouth at bedtime.    Yes Historical Provider, MD  simvastatin (ZOCOR) 20 MG tablet Take 20 mg by mouth at bedtime.    Yes Historical Provider, MD    Social History:  reports that he has been smoking Cigarettes.  He has a 11.25 pack-year smoking history. He has quit using smokeless tobacco. He reports that he uses illicit drugs (Marijuana). He reports that he does not drink alcohol.  Family History  Problem Relation Age of Onset  . Hypertension Mother   . Diabetes Mother   . Heart attack Mother   . Heart failure Mother   . Hypertension Sister   . Diabetes Sister   . Heart failure Sister     Review of Systems:  As per history of present illness, otherwise negative   Physical Exam: Blood pressure 122/82, pulse 89, temperature 98 F (36.7 C), temperature source Oral, resp. rate 19, height 5\' 8"  (1.727 m), weight 63.05  kg (139 lb), SpO2 95.00%. Constitutional: He appears well-developed, able to speak in full sentences but with signs of mild shortness of breath. Dry mucous membranes appreciated.  HEENT:  Head: Normocephalic. No traumas  Ears: No discharges, no erythema of his tympanic membrane  Eyes: EOM are normal. Pupils are equal, round, and reactive to light. No nystagmus Neck: Normal range of motion. Neck supple. No thyromegaly Cardiovascular: Normal rate and rhythm, S1 and S2 appreciated, no murmurs; no JVD.    Pulmonary/Chest: Decreased air movement bilaterally with scattered wheezing. No crackles. Abdominal: Soft. Tender to put patient in mid epigastric area, no guarding, no distention; positive bowel sounds are normal.  Extremities: No edema, cyanosis or clubbing. Intact pedal pulses.  Neurological: Alert, awake and oriented x3; cranial nerve 2-12 grossly intact; muscle strength 5 out of 5 bilaterally symmetrically; no focal deficit. Skin: Skin is warm. No rashes or petechiae.  Psychiatric: He has a normal mood and affect. His behavior is normal. Judgment and thought content normal.    Labs on Admission:  Results for orders placed during the hospital encounter of 06/22/11 (from the past 48 hour(s))  CBC     Status: Normal   Collection Time   06/22/11  3:45 AM      Component Value Range Comment   WBC 10.5  4.0 - 10.5 (K/uL)    RBC 5.20  4.22 - 5.81 (MIL/uL)    Hemoglobin 14.5  13.0 - 17.0 (g/dL)    HCT 40.9  81.1 - 91.4 (%)    MCV 82.9  78.0 - 100.0 (fL)    MCH 27.9  26.0 - 34.0 (pg)    MCHC 33.6  30.0 - 36.0 (g/dL)    RDW 78.2  95.6 - 21.3 (%)    Platelets 251  150 - 400 (K/uL)   POCT I-STAT TROPONIN I     Status: Normal   Collection Time   06/22/11  4:13 AM      Component Value Range Comment   Troponin i, poc 0.01  0.00 - 0.08 (ng/mL)    Comment 3            POCT I-STAT, CHEM 8     Status: Abnormal   Collection Time   06/22/11  4:14 AM      Component Value Range Comment   Sodium 137  135 - 145 (mEq/L)    Potassium 4.0  3.5 - 5.1 (mEq/L)    Chloride 102  96 - 112 (mEq/L)    BUN 12  6 - 23 (mg/dL)    Creatinine, Ser 0.86  0.50 - 1.35 (mg/dL)    Glucose, Bld 578 (*) 70 - 99 (mg/dL)    Calcium, Ion 4.69 (*) 1.12 - 1.32 (mmol/L)    TCO2 25  0 - 100 (mmol/L)    Hemoglobin 15.3  13.0 - 17.0 (g/dL)    HCT 62.9  52.8 - 41.3 (%)   URINALYSIS, ROUTINE W REFLEX MICROSCOPIC     Status: Abnormal   Collection Time   06/22/11  5:25 AM      Component Value Range Comment   Color, Urine YELLOW   YELLOW     APPearance CLEAR  CLEAR     Specific Gravity, Urine >1.030 (*) 1.005 - 1.030     pH 6.0  5.0 - 8.0     Glucose, UA NEGATIVE  NEGATIVE (mg/dL)    Hgb urine dipstick NEGATIVE  NEGATIVE     Bilirubin Urine NEGATIVE  NEGATIVE  Ketones, ur NEGATIVE  NEGATIVE (mg/dL)    Protein, ur NEGATIVE  NEGATIVE (mg/dL)    Urobilinogen, UA 0.2  0.0 - 1.0 (mg/dL)    Nitrite NEGATIVE  NEGATIVE     Leukocytes, UA NEGATIVE  NEGATIVE  MICROSCOPIC NOT DONE ON URINES WITH NEGATIVE PROTEIN, BLOOD, LEUKOCYTES, NITRITE, OR GLUCOSE <1000 mg/dL.  CULTURE, BLOOD (ROUTINE X 2)     Status: Normal (Preliminary result)   Collection Time   06/22/11  7:47 AM      Component Value Range Comment   Specimen Description BLOOD BLOOD LEFT FOREARM      Special Requests BOTTLES DRAWN AEROBIC AND ANAEROBIC  4 CC EACH      Culture PENDING      Report Status PENDING     CULTURE, BLOOD (ROUTINE X 2)     Status: Normal (Preliminary result)   Collection Time   06/22/11  7:51 AM      Component Value Range Comment   Specimen Description BLOOD BLOOD RIGHT FOREARM      Special Requests BOTTLES DRAWN AEROBIC AND ANAEROBIC  4 CC EACH      Culture PENDING      Report Status PENDING       Radiological Exams on Admission: Ct Abdomen Pelvis Wo Contrast  06/22/2011  *RADIOLOGY REPORT*  Clinical Data: Left flank pain  CT ABDOMEN AND PELVIS WITHOUT CONTRAST  Technique:  Multidetector CT imaging of the abdomen and pelvis was performed following the standard protocol without intravenous contrast.  Comparison: PET CT dated 06/06/2011  Findings: Emphysematous changes in the lung bases.  5 mm lingular nodule (series 3/image 4).  Unenhanced liver, spleen, pancreas, and adrenal glands within normal limits.  Gallbladder unremarkable.  No intrahepatic or extrahepatic ductal dilatation.  Kidneys within normal limits.  No renal calculi or hydronephrosis.  No evidence of bowel obstruction.  Normal appendix.  No colonic wall thickening or  inflammatory changes.  Atherosclerotic calcifications of the abdominal aorta and branch vessels.  No abdominopelvic ascites.  No suspicious abdominopelvic lymphadenopathy.  Prostate is unremarkable.  No ureteral or bladder calculi.  Very mild degenerative changes of the visualized thoracolumbar spine.  IMPRESSION: No renal, ureteral, or bladder calculi.  No hydronephrosis.  No evidence of bowel obstruction.  Normal appendix.  No CT findings to account for the patient's abdominal pain.  Original Report Authenticated By: Charline Bills, M.D.   Dg Chest 2 View  06/22/2011  *RADIOLOGY REPORT*  Clinical Data: Cough, congestion, and chills.  Smoker.  CHEST - 2 VIEW  Comparison: 06/17/2011  Findings: Cavitary lesion with scarring is again demonstrated in the left mid lung region.  The appearance is relatively stable since the prior study.  This could represent inflammatory process or mass lesion.  Follow up until resolution is recommended.  There is no developing infiltration.  Diffuse emphysematous changes and scattered fibrosis in the lungs.  Normal heart size and pulmonary vascularity.  IMPRESSION: Persistent cavitary lesion with associated scarring and infiltration again demonstrated in the left lung.  This appears stable since the previous study.  Follow up until resolution is recommended.  Diffuse emphysematous changes.  Original Report Authenticated By: Marlon Pel, M.D.     Assessment/Plan 1-SOB (shortness of breath): Most likely secondary to mild exacerbation of his COPD/bronchiectasis. At this point will start patient on prednisone, antibiotics (Avelox), nebulizers as needed and continue his inhalers. If the patient spikes fever will check blood cultures. The patient's symptoms might be secondary to flu will check influenza PCR  for diagnosis purposes, but at this point he is more than 72 hours after symptoms so will hold on Tamiflu.   2-Nausea vomiting and diarrhea: most likely associated to  flu as well. Will use antiemetics as needed. Patient will received fluid resuscitation and supportive care.  3-Dehydration: As mentioned above will provide fluid resuscitation.  4-Cough: Will use guaifenesin twice a day.   5-COPD (chronic obstructive pulmonary disease): As mentioned in problem #1 we'll provide  supplemental oxygen, nebulizer treatment, continue his inhalers, start prednisone and antibiotics    6-Abdominal pain: Normal CT of the abdomen; pain most likely secondary to flu symptoms and the fact that he has been coughing and vomiting prior to admission. Patient also with history of severe reflux disease and has not been taking his medication. Will provide GI cocktail X 1, restart PPI and follow symptoms.  7-GERD: start PPI  8-DVT:SCD's.  9-History of left upper lung mass/cavitary lesion: Status post bronchoscopy; so far cultures are negative (including AFB and fungal smear). Will curbside Dr. Edwyna Shell in the morning for further recommendations.   10-hyperglycemia: Will check hemoglobin A1c. Repeat CBG fasting in a.m.   11-hyperlipidemia: Check FLP continue statins.  12-depression: Continue Wellbutrin.  13-history of non-obstructive coronary artery disease: No abnormalities seen on EKG. Continue ASA.   Time Spent on Admission: 50 minutes  Abbygayle Helfand [facility] [phone]  06/22/2011, 6:47 PM

## 2011-06-22 NOTE — Significant Event (Signed)
Patient more hypoxic with oxygen saturation in low 80's. Received Advair  Physical examination: General: Appears comfortable, no acute respiratory distress. Lungs: scattered expiratory wheezes Heart: RRR  Impression and Plan: 1. COPD exacerbation: continue current plan of care with scheduled inhaler, prn nebulizers, prednisone and Avelox. Continue supplemental oxygen. Will defer transfer for tonight.

## 2011-06-22 NOTE — Progress Notes (Signed)
Talked with Dr. Burnadette Peter and she states pt only needs droplet precautions to r/o flu does not need airborne precautions from reading admitting doctors assessment that pt has been negative for TB

## 2011-06-23 DIAGNOSIS — J984 Other disorders of lung: Secondary | ICD-10-CM | POA: Diagnosis present

## 2011-06-23 DIAGNOSIS — J9601 Acute respiratory failure with hypoxia: Secondary | ICD-10-CM | POA: Diagnosis present

## 2011-06-23 LAB — HEMOGLOBIN A1C
Hgb A1c MFr Bld: 6.5 % — ABNORMAL HIGH (ref <5.7)
Mean Plasma Glucose: 140 mg/dL — ABNORMAL HIGH (ref <117)

## 2011-06-23 LAB — BASIC METABOLIC PANEL
GFR calc Af Amer: >90 mL/min (ref >90)
GFR calc non Af Amer: 78 mL/min — ABNORMAL LOW (ref >90)
Potassium: 4.6 mEq/L (ref 3.5–5.1)
Sodium: 139 mEq/L (ref 135–145)

## 2011-06-23 LAB — INFLUENZA PANEL BY PCR (TYPE A & B)
Influenza A By PCR: NEGATIVE
Influenza B By PCR: NEGATIVE

## 2011-06-23 LAB — CBC
Hemoglobin: 13.1 g/dL (ref 13.0–17.0)
RBC: 4.67 MIL/uL (ref 4.22–5.81)

## 2011-06-23 LAB — LIPID PANEL
Cholesterol: 131 mg/dL (ref 0–200)
LDL Cholesterol: 85 mg/dL (ref 0–99)
VLDL: 17 mg/dL (ref 0–40)

## 2011-06-23 LAB — AFB CULTURE WITH SMEAR (NOT AT ARMC)

## 2011-06-23 MED ORDER — ALBUTEROL SULFATE (5 MG/ML) 0.5% IN NEBU
2.5000 mg | INHALATION_SOLUTION | Freq: Four times a day (QID) | RESPIRATORY_TRACT | Status: DC
  Administered 2011-06-23 – 2011-06-24 (×3): 2.5 mg via RESPIRATORY_TRACT
  Filled 2011-06-23 (×3): qty 0.5

## 2011-06-23 MED ORDER — IPRATROPIUM BROMIDE 0.02 % IN SOLN
0.5000 mg | Freq: Four times a day (QID) | RESPIRATORY_TRACT | Status: DC
  Administered 2011-06-23 – 2011-06-24 (×3): 0.5 mg via RESPIRATORY_TRACT
  Filled 2011-06-23 (×3): qty 2.5

## 2011-06-23 MED ORDER — GUAIFENESIN 100 MG/5ML PO SOLN
5.0000 mL | ORAL | Status: DC | PRN
  Administered 2011-06-23 (×2): 100 mg via ORAL
  Filled 2011-06-23 (×2): qty 5

## 2011-06-23 MED ORDER — BENZONATATE 100 MG PO CAPS
100.0000 mg | ORAL_CAPSULE | Freq: Three times a day (TID) | ORAL | Status: DC | PRN
  Administered 2011-06-23: 100 mg via ORAL
  Filled 2011-06-23 (×2): qty 1

## 2011-06-23 NOTE — Progress Notes (Signed)
UR COMPLETED. PT WAS ADMITTED FOR SOB, COPD EXACERBATION.  PTA PT WAS AT HOME AND PLANS TO RETURN AT DC.  DC MAYBE TOMORROW IF MEDICALLY STABLE.   Ryan [name] 06/23/2011 [phone] OR [phone]

## 2011-06-23 NOTE — Progress Notes (Signed)
Subjective: He endorses marked improvement in respiratory symptoms since arrival - he still complains of a combination of pleuritic-type pain in the back with respiratory effort. He also endorses abdominal discomfort with breathing he feels began after onset of coughing prior to admission. Denies true cardiac-type chest pain. States does not carry a formal diagnosis of obstructive sleep apnea.  Objective: Vital signs in last 24 hours: Temp:  [97.8 F (36.6 C)-99.2 F (37.3 C)] 97.8 F (36.6 C) (12/10 1227) Pulse Rate:  [68-104] 86  (12/10 1227) Resp:  [17-26] 21  (12/10 1227) BP: (106-133)/(70-95) 121/76 mmHg (12/10 1227) SpO2:  [80 %-97 %] 95 % (12/10 1227) Weight:  [63.05 kg (139 lb)-63.5 kg (139 lb 15.9 oz)] 139 lb 15.9 oz (63.5 kg) (12/10 0500) Weight change: -0.454 kg (-1 lb) Last BM Date: 06/22/11  Intake/Output from previous day: 12/09 0701 - 12/10 0700 In: 2620 [P.O.:1320; I.V.:1300] Out: 1150 [Urine:850; Emesis/NG output:300] Intake/Output this shift: Total I/O In: 980 [P.O.:780; I.V.:200] Out: 1050 [Urine:1050]  General appearance: alert, cooperative, appears stated age and no distress Resp: Mostly clear to auscultation bilaterally except for a few scattered wheezes, remains on 2 L nasal cannula oxygen, respiratory effort is nonlabored and without tachypnea. Cardio: regular rate and rhythm, S1, S2 normal, no murmur, click, rub or gallop GI: soft, non-tender; bowel sounds normal; no masses,  no organomegaly Extremities: extremities normal, atraumatic, no cyanosis or edema Neurologic: Grossly normal  Lab Results:  Basename 06/23/11 0555 06/22/11 0414 06/22/11 0345  WBC 7.3 -- 10.5  HGB 13.1 15.3 --  HCT 39.3 45.0 --  PLT 187 -- 251   BMET  Basename 06/23/11 0555 06/22/11 0414  NA 139 137  K 4.6 4.0  CL 106 102  CO2 27 --  GLUCOSE 94 122*  BUN 9 12  CREATININE 1.07 1.10  CALCIUM 8.2* --    Studies/Results: Ct Abdomen Pelvis Wo Contrast  06/22/2011   *RADIOLOGY REPORT*  Clinical Data: Left flank pain  CT ABDOMEN AND PELVIS WITHOUT CONTRAST  Technique:  Multidetector CT imaging of the abdomen and pelvis was performed following the standard protocol without intravenous contrast.  Comparison: PET CT dated 06/06/2011  Findings: Emphysematous changes in the lung bases.  5 mm lingular nodule (series 3/image 4).  Unenhanced liver, spleen, pancreas, and adrenal glands within normal limits.  Gallbladder unremarkable.  No intrahepatic or extrahepatic ductal dilatation.  Kidneys within normal limits.  No renal calculi or hydronephrosis.  No evidence of bowel obstruction.  Normal appendix.  No colonic wall thickening or inflammatory changes.  Atherosclerotic calcifications of the abdominal aorta and branch vessels.  No abdominopelvic ascites.  No suspicious abdominopelvic lymphadenopathy.  Prostate is unremarkable.  No ureteral or bladder calculi.  Very mild degenerative changes of the visualized thoracolumbar spine.  IMPRESSION: No renal, ureteral, or bladder calculi.  No hydronephrosis.  No evidence of bowel obstruction.  Normal appendix.  No CT findings to account for the patient's abdominal pain.  Original Report Authenticated By: Charline Bills, M.D.   Dg Chest 2 View  06/22/2011  *RADIOLOGY REPORT*  Clinical Data: Cough, congestion, and chills.  Smoker.  CHEST - 2 VIEW  Comparison: 06/17/2011  Findings: Cavitary lesion with scarring is again demonstrated in the left mid lung region.  The appearance is relatively stable since the prior study.  This could represent inflammatory process or mass lesion.  Follow up until resolution is recommended.  There is no developing infiltration.  Diffuse emphysematous changes and scattered fibrosis in the lungs.  Normal heart size and pulmonary vascularity.  IMPRESSION: Persistent cavitary lesion with associated scarring and infiltration again demonstrated in the left lung.  This appears stable since the previous study.  Follow  up until resolution is recommended.  Diffuse emphysematous changes.  Original Report Authenticated By: Marlon Pel, M.D.    Medications:  I have reviewed the patient's current medications. Scheduled:   . aspirin EC  81 mg Oral Daily  . buPROPion  150 mg Oral BID  . Fluticasone-Salmeterol  1 puff Inhalation Q12H  . gi cocktail  30 mL Oral Once  . guaiFENesin  600 mg Oral BID  .  HYDROmorphone (DILAUDID) injection  1 mg Intravenous Once  . loratadine  10 mg Oral Daily  . moxifloxacin  400 mg Intravenous Q24H  . pantoprazole  40 mg Oral BID AC  . predniSONE  10 mg Oral Q breakfast  . simvastatin  20 mg Oral QHS  . DISCONTD: aspirin  81 mg Oral Daily    Assessment/Plan:   *Acute respiratory failure with hypoxia/ Decompensated COPD with exacerbation (chronic obstructive pulmonary disease)/acute bronchitis Physical exam and response to treatment more consistent with acute decompensated COPD exacerbation. We'll continue prednisone as well as nebulizer and metered-dose inhaler therapy.  Will also continue empiric antibiotic therapy day #2 with Avelox.   SOB (shortness of breath) Has essentially resolved with appropriate treatment of problem #1. We'll mobilize and continue treatments as noted above.   Nausea vomiting and diarrhea/Dehydration  Has resolved. Tolerating diet. Influenza A and B PCR both are negative. Can discontinue droplet isolation as well   Cough/ Abdominal pain  Likely related to acute COPD exacerbation with probable underlying acute bronchitis. Tessalon Perles and guaifenesin have been added today.   Pulmonary cavitary lesion Has been thoroughly evaluated by Dr. Edwyna Shell. He underwent fiberoptic bronchoscopy on 06/11/2011 and subsequent bronchial washings for AFB as well as culture have been negative. There are no indications for pulmonary isolation for TB. Etiology at this time remains unclear. Tissue pathology report remains pending. Dr. Sharon Seller did notify Dr.  Edwyna Shell of the patient's admission to the hospital but a formal consult was not felt to be needed unless Dr. Edwyna Shell wish to address specific issues while the patient is in the hospital.  Nonobstructive coronary disease He does not endorse any cardiac symptoms. His EKG was negative.  Disposition Stable to transfer to non-telemetry medical floor today. Anticipate possible discharge within the next 24-48 hours.   LOS: 1 day   Junious Silk, ANP pager [phone] 06/23/2011, 12:50 PM  I have personally examined this patient and reviewed the entire database. I have reviewed the above note, made any necessary editorial changes, and agree with its content.  Lonia Blood, MD [facility]

## 2011-06-24 DIAGNOSIS — J984 Other disorders of lung: Secondary | ICD-10-CM

## 2011-06-24 LAB — URINE CULTURE
Culture  Setup Time: 201212101340
Culture: NO GROWTH

## 2011-06-24 MED ORDER — OXYCODONE HCL 5 MG PO TABS: 5.0000 mg | ORAL_TABLET | ORAL | Status: AC | PRN

## 2011-06-24 MED ORDER — BENZONATATE 100 MG PO CAPS: 100.0000 mg | ORAL_CAPSULE | Freq: Three times a day (TID) | ORAL | Status: AC | PRN

## 2011-06-24 MED ORDER — MOXIFLOXACIN HCL 400 MG PO TABS: 400.0000 mg | ORAL_TABLET | Freq: Every day | ORAL | Status: AC

## 2011-06-24 MED ORDER — GUAIFENESIN ER 600 MG PO TB12: 600.0000 mg | ORAL_TABLET | Freq: Two times a day (BID) | ORAL | Status: DC

## 2011-06-24 MED ORDER — PREDNISONE 10 MG PO TABS: 10.0000 mg | ORAL_TABLET | Freq: Every day | ORAL | Status: DC

## 2011-06-24 NOTE — Discharge Summary (Signed)
DISCHARGE SUMMARY  Ryan [name]  MR#: [id number]  DOB:[birth year]  Date of Admission: 06/22/2011 Date of Discharge: 06/24/2011  Attending Physician:Saima Rizwan  Patient's NWG:NFAO,ZHYQMVH PATRICK, MD, MD  Consults: Dr. Karle Plumber with [facility]  Discharge Diagnoses: Principal Problem:  *Acute respiratory failure with hypoxia Active Problems:  Decompensated COPD with exacerbation (chronic obstructive pulmonary disease)  SOB (shortness of breath)  Nausea vomiting and diarrhea  Dehydration  Cough  Abdominal pain  Pulmonary cavitary lesion   Radiology: Ct Abdomen Pelvis Wo Contrast  06/22/2011  *RADIOLOGY REPORT*  Clinical Data: Left flank pain  CT ABDOMEN AND PELVIS WITHOUT CONTRAST  Technique:  Multidetector CT imaging of the abdomen and pelvis was performed following the standard protocol without intravenous contrast.  Comparison: PET CT dated 06/06/2011  Findings: Emphysematous changes in the lung bases.  5 mm lingular nodule (series 3/image 4).  Unenhanced liver, spleen, pancreas, and adrenal glands within normal limits.  Gallbladder unremarkable.  No intrahepatic or extrahepatic ductal dilatation.  Kidneys within normal limits.  No renal calculi or hydronephrosis.  No evidence of bowel obstruction.  Normal appendix.  No colonic wall thickening or inflammatory changes.  Atherosclerotic calcifications of the abdominal aorta and branch vessels.  No abdominopelvic ascites.  No suspicious abdominopelvic lymphadenopathy.  Prostate is unremarkable.  No ureteral or bladder calculi.  Very mild degenerative changes of the visualized thoracolumbar spine.  IMPRESSION: No renal, ureteral, or bladder calculi.  No hydronephrosis.  No evidence of bowel obstruction.  Normal appendix.  No CT findings to account for the patient's abdominal pain.  Original Report Authenticated By: Charline Bills, M.D.   Dg Chest 2 View  06/22/2011  *RADIOLOGY REPORT*  Clinical Data:  Cough, congestion, and chills.  Smoker.  CHEST - 2 VIEW  Comparison: 06/17/2011  Findings: Cavitary lesion with scarring is again demonstrated in the left mid lung region.  The appearance is relatively stable since the prior study.  This could represent inflammatory process or mass lesion.  Follow up until resolution is recommended.  There is no developing infiltration.  Diffuse emphysematous changes and scattered fibrosis in the lungs.  Normal heart size and pulmonary vascularity.  IMPRESSION: Persistent cavitary lesion with associated scarring and infiltration again demonstrated in the left lung.  This appears stable since the previous study.  Follow up until resolution is recommended.  Diffuse emphysematous changes.  Original Report Authenticated By: Marlon Pel, M.D.   Dg Chest 2 View  06/17/2011  *RADIOLOGY REPORT*  Clinical Data: Left upper lobe lesion, status post bronchoscopic biopsy and culture  CHEST - 2 VIEW  Comparison: 06/11/2011, 06/06/2011  Findings: Severe bullous emphysema noted throughout both lungs with hyperinflation.  Irregular peripheral subpleural mass-like opacity persist, minimal interval change compared to the pre biopsy exam 06/09/2011.  No effusion or pneumothorax.  Stable heart size and vascularity.  No superimposed CHF or edema.  Trachea midline.  IMPRESSION: Bullous emphysema  Persistent left upper lobe mass-like opacity.  No pleural effusion or pneumothorax following bronchoscopic biopsy.  Original Report Authenticated By: Judie Petit. Ruel Favors, M.D.   Dg Chest 2 View Within Previous 72 Hours.  Films Obtained On Friday Are Acceptable For Monday And Tuesday Cases  06/09/2011  *RADIOLOGY REPORT*  Clinical Data: Preoperative chest radiograph.  CHEST - 2 VIEW  Comparison: 05/06/2011 PET CT.  Findings: Emphysema.  Mass-like density in the left upper lobe appears little changed compared to the recent PET CT.  Cicatricial changes are present around the lesion.  Breath estimates  of  size are 6 cm transverse by 4 cm cranial-caudal.  Cardiopericardial silhouette and mediastinal contours are within normal limits. Negative for effusion.  IMPRESSION: Emphysema.  Left upper lobe mass.  No acute cardiopulmonary disease.  Original Report Authenticated By: Andreas Newport, M.D.   Nm Pet Image Initial (pi) Skull Base To Thigh  06/06/2011  *RADIOLOGY REPORT*  Clinical Data:  Initial treatment strategy for left lung mass.  NUCLEAR MEDICINE PET CT INITIAL (PI) SKULL BASE TO THIGH  Technique:  19.1 mCi F-18 FDG was injected intravenously via the right arm.  Full-ring PET imaging was performed from the skull base through the mid-thighs 65  minutes after injection.  CT data was obtained and used for attenuation correction and anatomic localization only.  (This was not acquired as a diagnostic CT examination.)  Fasting Blood Glucose:  102  Patient Weight:  140 pounds.  Comparison:  Chest CT on 05/19/2011  Findings: Severe pulmonary emphysema again demonstrated.  Ill- defined confluent opacity in the left upper lobe shows intense hypermetabolic activity, with maximum SUV of 18.4.  Hypermetabolic left hilar lymphadenopathy is seen, with maximum SUV of 4.8.  Shotty mediastinal lymph nodes show no hypermetabolic activity.  A 1 cm left axillary lymph node with fatty hilum shows mild hypermetabolic activity, with maximum SUV of 2.3.  No other hypermetabolic lymphadenopathy is identified within the thorax.  No hypermetabolic soft tissue masses or lymphadenopathy identified within the neck, abdomen, or pelvis.  IMPRESSION:  1.  Ill-defined confluent left upper lobe opacity shows diffuse hypermetabolic activity.  Differential diagnosis includes infectious or inflammatory processes as well as bronchogenic carcinoma. 2.  Hypermetabolic left hilar lymphadenopathy. 3.  1 cm left axillary lymph node with normal fatty hilum shows low grade hypermetabolic activity. 4.  No evidence of extra-thoracic metastatic disease.   Original Report Authenticated By: Danae Orleans, M.D.   Dg Chest Port 1 View  06/11/2011  *RADIOLOGY REPORT*  Clinical Data: Post bronchoscopy  PORTABLE CHEST - 1 VIEW  Comparison: 06/09/2011  Findings: Normal heart size, mediastinal contours, and pulmonary vascularity. Emphysematous changes with minimal chronic peribronchial thickening. Cavitary opacity in the left upper lobe laterally unchanged. Minimal surrounding density likely related to bronchoscopy. Bibasilar atelectasis and minimal interstitial prominence. Bullous disease right upper lobe. No gross pleural effusion or pneumothorax. No acute osseous findings.  IMPRESSION: Persistent cavitary left upper lobe mass with minimal surrounding density likely related to bronchoscopy, could represent edema, infection, or hemorrhage. Changes of COPD with bibasilar atelectasis. No pneumothorax.  Original Report Authenticated By: Lollie Marrow, M.D.   Dg Bronchi Uni  06/11/2011  CLINICAL DATA: Bronchoscopy with navigation   BRONCHO UNI FLUORO  Fluoroscopy was utilized by the requesting physician.  No radiographic  interpretation.      Laboratory: Results for orders placed during the hospital encounter of 06/22/11 (from the past 48 hour(s))  SURGICAL PCR SCREEN     Status: Abnormal   Collection Time   06/22/11  5:41 PM      Component Value Range Comment   MRSA, PCR INVALID RESULTS, SPECIMEN SENT FOR CULTURE (*) NEGATIVE  NOTIFIED MOOSE,P RN [phone] AT [address],P   Staphylococcus aureus INVALID RESULTS, SPECIMEN SENT FOR CULTURE (*) NEGATIVE    MRSA CULTURE     Status: Normal (Preliminary result)   Collection Time   06/22/11  5:41 PM      Component Value Range Comment   Specimen Description NASOPHARYNGEAL      Special Requests SAPCR WAS INVALID  Culture NO GROWTH 1 DAY      Report Status PENDING     INFLUENZA PANEL BY PCR     Status: Normal   Collection Time   06/22/11  6:59 PM      Component Value Range Comment   Influenza A By PCR  NEGATIVE  NEGATIVE     Influenza B By PCR NEGATIVE  NEGATIVE     H1N1 flu by pcr NOT DETECTED  NOT DETECTED    HEMOGLOBIN A1C     Status: Abnormal   Collection Time   06/22/11  8:15 PM      Component Value Range Comment   Hemoglobin A1C 6.5 (*) <5.7 (%)    Mean Plasma Glucose 140 (*) <117 (mg/dL)   BASIC METABOLIC PANEL     Status: Abnormal   Collection Time   06/23/11  5:55 AM      Component Value Range Comment   Sodium 139  135 - 145 (mEq/L)    Potassium 4.6  3.5 - 5.1 (mEq/L)    Chloride 106  96 - 112 (mEq/L)    CO2 27  19 - 32 (mEq/L)    Glucose, Bld 94  70 - 99 (mg/dL)    BUN 9  6 - 23 (mg/dL)    Creatinine, Ser 1.61  0.50 - 1.35 (mg/dL)    Calcium 8.2 (*) 8.4 - 10.5 (mg/dL)    GFR calc non Af Amer 78 (*) >90 (mL/min)    GFR calc Af Amer >90  >90 (mL/min)   CBC     Status: Normal   Collection Time   06/23/11  5:55 AM      Component Value Range Comment   WBC 7.3  4.0 - 10.5 (K/uL)    RBC 4.67  4.22 - 5.81 (MIL/uL)    Hemoglobin 13.1  13.0 - 17.0 (g/dL)    HCT 09.6  04.5 - 40.9 (%)    MCV 84.2  78.0 - 100.0 (fL)    MCH 28.1  26.0 - 34.0 (pg)    MCHC 33.3  30.0 - 36.0 (g/dL)    RDW 81.1  91.4 - 78.2 (%)    Platelets 187  150 - 400 (K/uL)   LIPID PANEL     Status: Abnormal   Collection Time   06/23/11  5:55 AM      Component Value Range Comment   Cholesterol 131  0 - 200 (mg/dL)    Triglycerides 83  <956 (mg/dL)    HDL 29 (*) >21 (mg/dL)    Total CHOL/HDL Ratio 4.5      VLDL 17  0 - 40 (mg/dL)    LDL Cholesterol 85  0 - 99 (mg/dL)      Current Discharge Medication List    START taking these medications   Details  benzonatate (TESSALON) 100 MG capsule Take 1 capsule (100 mg total) by mouth 3 (three) times daily as needed for cough. Qty: 20 capsule, Refills: 0    guaiFENesin (MUCINEX) 600 MG 12 hr tablet Take 1 tablet (600 mg total) by mouth 2 (two) times daily. Qty: 20 tablet, Refills: 0    moxifloxacin (AVELOX) 400 MG tablet Take 1 tablet (400 mg total) by  mouth daily. Qty: 10 tablet, Refills: 0    oxyCODONE (OXY IR/ROXICODONE) 5 MG immediate release tablet Take 1 tablet (5 mg total) by mouth every 4 (four) hours as needed for pain. Qty: 30 tablet, Refills: 0    predniSONE (DELTASONE) 10 MG tablet Take  1 tablet (10 mg total) by mouth daily with breakfast. Qty: 5 tablet, Refills: 0.      CONTINUE these medications which have NOT CHANGED   Details  albuterol (VENTOLIN HFA) 108 (90 BASE) MCG/ACT inhaler Inhale 2 puffs into the lungs every 4 (four) hours as needed. Shortness of breath    aspirin 81 MG tablet Take 81 mg by mouth daily.     buPROPion (WELLBUTRIN SR) 150 MG 12 hr tablet Take 150 mg by mouth 2 (two) times daily.      cetirizine (ZYRTEC) 10 MG tablet Take 10 mg by mouth daily.     Fluticasone-Salmeterol (ADVAIR DISKUS) 250-50 MCG/DOSE AEPB Inhale 1 puff into the lungs every 12 (twelve) hours.     omeprazole (PRILOSEC) 20 MG capsule Take 20 mg by mouth at bedtime.     simvastatin (ZOCOR) 20 MG tablet Take 20 mg by mouth at bedtime.        History of present illness: 50 year old gentleman with known COPD, as well as a left upper lung mass/cavitary lesion post bronchoscopy with biopsy by Dr. Edwyna Shell on 06/11/2011. Presented to the hospital due to progressive shortness of breath with wheezing with associated productive cough fever and chills onset 05/18/2011. In addition he has had problems related to abdominal pain nausea and vomiting patient feels the symptoms are related to persistent coughing and muscle strain. He initially presented to [facility] but because of the cavitary lesion seen on x-ray he was subsequently transferred to [facility] to rule out acute tuberculosis process.after arrival to [facility] he was evaluated by the admitting physician and found to be afebrile and hemodynamically stable. From a pulmonary standpoint he was relatively stable and was noted to have decreased air movement bilaterally  with scattered wheezing no crackles he was not in any acute respiratory distress at that time.his presenting symptoms were more consistent with a COPD exacerbation with possible acute bronchitis based on the patient's expressed history.    Hospital Course: Principal Problem:  *Acute respiratory failure with hypoxia/Decompensated COPD with exacerbation (chronic obstructive pulmonary disease) Respiratory symptoms rapidly improved with the addition of nebulizer therapy as well as all initiation of steroid therapy.He was also empirically started on antibiotics (Avelox) to cover for  possible bronchitic symptoms.he will continue this antibiotic for the next 10 days. By date of discharge patient was without any dyspneic symptoms and he was maintaining O2 saturations of 100% on 2 L of nasal cannula oxygen. Plan is to discontinue oxygen at this time as long as maintaining O2 saturations of at least 92%. If he is unable to maintain oxygen saturations as prescribed will most likely need to order oxygen for home use after discharge. Oxygen saturations were checked on room air and are running between 88 and 89%. The patient endorses that this is normal for him and he is currently asymptomatic. If the patient changes his mind and desires oxygen we will certainly provide this for him at discharge  Active Problems:  SOB (shortness of breath) This has resolved with treatment of the COPD and possible bronchitis.   Nausea vomiting and diarrhea/ Dehydration This resolved with adequate treatment of respiratory symptoms, pain syndrome, and adequate control of cough. There was some concern patient may have influenza based on the symptoms but PCR for both influenza A and B. Were negative.   Cough/ Abdominal pain The symptoms were felt to be related to acute COPD exacerbation with underlying bronchitis. Guaifenesin and Tessalon Perles were added. Patient endorses  continued issues regarding pleuritic chest discomfort which  currently is being controlled by OxyIR we will give him a prescription for this at discharge.   Pulmonary cavitary lesion This patient has been thoroughly evaluated by Dr. Edwyna Shell prior to this admission. He underwent fiberoptic bronchoscopy on 06/11/2011 and subsequent bronchial washings for AFB and culture have been negative. There was no indications for pulmonary isolation for TB therefore this was discontinued upon admission to [facility]. Tissue pathology for the cavitary lesion on the left are pending.Dr. Edwyna Shell did see the patient on 06/24/2011. He did document that he would consider needle biopsy of the left upper lobe lesion while in the hospital but this patient is currently stable enough to be discharged and therefore patient will follow up with Dr. Edwyna Shell as previously scheduled where discussion can be held regarding timing of needle biopsy procedure.   Day of Discharge BP 116/76  Pulse 76  Temp(Src) 97.8 F (36.6 C) (Oral)  Resp 20  Ht 5\' 8"  (1.727 m)  Wt 63.5 kg (139 lb 15.9 oz)  BMI 21.29 kg/m2  SpO2 100%  Physical Exam:  General appearance: alert, cooperative, appears stated age and no distress, he is requesting to discharge to home. Resp: clear to auscultation bilaterally Cardio: regular rate and rhythm, S1, S2 normal, no murmur, click, rub or gallop GI: soft, non-tender; bowel sounds normal; no masses,  no organomegaly Extremities: extremities normal, atraumatic, no cyanosis or edema Neurologic: Grossly normal  Follow-up: He has been informed he needs to contact Dr. Modesto Charon to schedule an appointment to be seen in one week. This appointment is for routine hospital followup post treatment for COPD exacerbation and acute bronchitis. In addition he is to call Dr. Scheryl Darter office and/or keeping the other previously scheduled appointments. This is to discuss timing of needle biopsy for left upper lobe lesion  Disposition:  He will discharge to home via private vehicle.   Junious Silk, ANP pager [phone]

## 2011-06-24 NOTE — Progress Notes (Signed)
PT HAS BEEN DC'D AND REFUSED HOME O2, NOW AFTER BEING HOME HE HAS SEEN THE NEED FOR HOME O2. PT DOESN'T HAVE ENOUGH INFORMATION OF DOCUMENTED SATS TO QUALIFY HIM FOR HOME O2 ACCORDING TO [facility] ( NO AMBULATING SATS DOCUMENTED).  I CONTACTED THE PT AND ASKED HIM IF HE WAS WILLING TO PAY OUT OF POCKET FOR HIS O2 AND HE REFUSED.  I THEN ADVISED HIM TO CALL HIS PCP AND EXPLAIN WHAT HAPPENED AND SEE IF THEY ARE ABLE TO SEE HIM AND TAKE HIS SATS AND ARRANGE O2.  PT STATED THAT HE UNDERSTOOD AND HE WOULD LET THEM KNOW.   Willa Rough 06/24/2011 [phone] OR [phone]

## 2011-06-24 NOTE — Progress Notes (Signed)
                                                  Subjective: Patient well known to me. Cultures are pending. I will consider a needle biopsy while he is in hospital.  Objective: Vital signs in last 24 hours: Temp:  [97.7 F (36.5 C)-98 F (36.7 C)] 97.8 F (36.6 C) (12/11 0700) Pulse Rate:  [75-107] 76  (12/11 0700) Cardiac Rhythm:  [-]  Resp:  [17-22] 20  (12/11 0700) BP: (109-131)/(67-79) 116/76 mmHg (12/11 0700) SpO2:  [89 %-100 %] 100 % (12/11 0700)  Hemodynamic parameters for last 24 hours:    Intake/Output from previous day: 12/10 0701 - 12/11 0700 In: 1885 [P.O.:1500; I.V.:385] Out: 3225 [Urine:3225] Intake/Output this shift: Total I/O In: 240 [P.O.:240] Out: 125 [Urine:125]  General appearance: alert  Lab Results:  Basename 06/23/11 0555 06/22/11 0414 06/22/11 0345  WBC 7.3 -- 10.5  HGB 13.1 15.3 --  HCT 39.3 45.0 --  PLT 187 -- 251   BMET:  Basename 06/23/11 0555 06/22/11 0414  NA 139 137  K 4.6 4.0  CL 106 102  CO2 27 --  GLUCOSE 94 122*  BUN 9 12  CREATININE 1.07 1.10  CALCIUM 8.2* --    PT/INR: No results found for this basename: LABPROT,INR in the last 72 hours ABG    Component Value Date/Time   PHART 7.398 03/12/2011 1220   HCO3 22.2 03/12/2011 1220   TCO2 25 06/22/2011 0414   ACIDBASEDEF 1.9 03/12/2011 1220   O2SAT 92.4 03/12/2011 1220   CBG (last 3)  No results found for this basename: GLUCAP:3 in the last 72 hours  Assessment/Plan: S/P   Will consider needle biopsy of LUL lesion.   LOS: 2 days    Avir Deruiter [facility] 06/24/2011

## 2011-06-24 NOTE — Progress Notes (Signed)
At the time of discharge the patient declined offer of oxygen therapy despite meeting appropriate parameters for obtaining such treatment. After the patient returned home he realized that he was somewhat symptomatic with the low oxygen levels despite his protestations here at the hospital that 87-88% with a normal range for him. He subsequently called back to the nursing unit requesting that oxygen be ordered for him. I did contact Catha Gosselin clinical social worker and she is in the process of obtaining home health oxygen for this patient to use at home.

## 2011-06-24 NOTE — Progress Notes (Signed)
   CARE MANAGEMENT NOTE 06/24/2011  Patient:  Ryan [name], Ryan [name]   Account Number:  [id number]  Date Initiated:  06/23/2011  Documentation initiated by:  Onnie Boer  Subjective/Objective Assessment:   PT WAS ADMITTED WITH SOB, COPD EXACERBATION     Action/Plan:   PROGRESSION OF CARE AND DISCHARGE PLANNING   Anticipated DC Date:  06/20/2011   Anticipated DC Plan:  HOME W HOME HEALTH SERVICES      DC Planning Services  CM consult      Choice offered to / List presented to:             Status of service:  Completed, signed off Medicare Important Message given?   (If response is "NO", the following Medicare IM given date fields will be blank) Date Medicare IM given:   Date Additional Medicare IM given:    Discharge Disposition:  HOME/SELF CARE  Per UR Regulation:  Reviewed for med. necessity/level of care/duration of stay  Comments:  06/24/11 Onnie Boer, RN, BSN 1616 PT REFUSED O2 AT DC, ONCE HOME PT THOUGHT THAT HE NEEDED IT.  PT NOTES DIDNT QUALIFY ACCORDING TO [facility].  PT WAS OFFERED  TO PAY OUT OF POCKET AND REFUSED.  HE WAS ADVISED TO CALL PCP.  UR COMPLETED.  06/23/2011 Onnie Boer, RN, BSN 1354 PT WAS ADMITTED WITH SOB, COPD EXACERBATION. PTA PT WAS AT HOME AND PLANS TO RETURN AT DC.  DC PENDING FOR TOMORROW IF MEDICALLY STABLE.

## 2011-06-25 ENCOUNTER — Emergency Department (HOSPITAL_COMMUNITY)
Admission: EM | Admit: 2011-06-25 | Discharge: 2011-06-25 | Disposition: A | Discharge status: Home / Self Care | Payer: Medicare Other | Attending: Emergency Medicine | Admitting: Emergency Medicine

## 2011-06-25 ENCOUNTER — Encounter (HOSPITAL_COMMUNITY): Payer: Self-pay | Admitting: Emergency Medicine

## 2011-06-25 DIAGNOSIS — E7800 Pure hypercholesterolemia, unspecified: Secondary | ICD-10-CM | POA: Insufficient documentation

## 2011-06-25 DIAGNOSIS — Z7982 Long term (current) use of aspirin: Secondary | ICD-10-CM | POA: Insufficient documentation

## 2011-06-25 DIAGNOSIS — J438 Other emphysema: Secondary | ICD-10-CM | POA: Insufficient documentation

## 2011-06-25 DIAGNOSIS — K5900 Constipation, unspecified: Secondary | ICD-10-CM | POA: Insufficient documentation

## 2011-06-25 DIAGNOSIS — Z9861 Coronary angioplasty status: Secondary | ICD-10-CM | POA: Insufficient documentation

## 2011-06-25 DIAGNOSIS — Z8701 Personal history of pneumonia (recurrent): Secondary | ICD-10-CM | POA: Insufficient documentation

## 2011-06-25 DIAGNOSIS — R109 Unspecified abdominal pain: Secondary | ICD-10-CM | POA: Insufficient documentation

## 2011-06-25 DIAGNOSIS — K219 Gastro-esophageal reflux disease without esophagitis: Secondary | ICD-10-CM | POA: Insufficient documentation

## 2011-06-25 DIAGNOSIS — I2510 Atherosclerotic heart disease of native coronary artery without angina pectoris: Secondary | ICD-10-CM | POA: Insufficient documentation

## 2011-06-25 DIAGNOSIS — J984 Other disorders of lung: Secondary | ICD-10-CM | POA: Insufficient documentation

## 2011-06-25 LAB — MRSA CULTURE: Culture: NO GROWTH

## 2011-06-25 LAB — CULTURE, RESPIRATORY W GRAM STAIN

## 2011-06-25 NOTE — ED Provider Notes (Signed)
This chart was scribed for Joya Gaskins, MD by Wallis Mart. The patient was seen in room APA05/APA05 and the patient's care was started at 10:01 AM.   CSN: [id number] Arrival date & time: 06/25/2011  9:08 AM   First MD Initiated Contact with Patient 06/25/11 0920      Chief Complaint  Patient presents with  . Constipation     Patient is a 50 y.o. male presenting with constipation. The history is provided by the patient.  Constipation  The current episode started today. The onset was sudden. The problem occurs continuously. The problem has been unchanged. The pain is mild. There was no prior successful therapy. Prior unsuccessful therapies include stool softeners. Associated symptoms include abdominal pain. Pertinent negatives include no fever, no vomiting and no chest pain. Recently, medical care has been given at this facility.     Ryan [name] is a 50 y.o. male who presents to the Emergency Department complaining of sudden onset constipation.  Pt is passing small amounts of stool.  Pt took 4 docusate sodium tablets yesterday with no relief.  Pt c/o  abdominal pain that began at 7 am.  Past Medical History  Diagnosis Date  . Emphysema   . COPD (chronic obstructive pulmonary disease)   . Hypercholesteremia   . Pneumonia   . Polycythemia   . Coronary artery disease   . Cavitary lesion of lung 05/07/2011  . Borderline diabetes   . Seizures     last seizure 2 yrs ago  . GERD (gastroesophageal reflux disease)     Past Surgical History  Procedure Date  . Angioplasty   . Colonoscopy w/ endoscopic [place]   . Vasectomy   . Throat biopsy   . Lung biopsy   . Vasectomy 1987  . Cardiac catheterization     Family History  Problem Relation Age of Onset  . Hypertension Mother   . Diabetes Mother   . Heart attack Mother   . Heart failure Mother   . Hypertension Sister   . Diabetes Sister   . Heart failure Sister     History  Substance Use Topics  . Smoking status:  Current Some Day Smoker -- 0.2 packs/day for 45 years    Types: Cigarettes  . Smokeless tobacco: Former Neurosurgeon  . Alcohol Use: No      Review of Systems  Constitutional: Negative for fever.  Cardiovascular: Negative for chest pain.  Gastrointestinal: Positive for abdominal pain and constipation. Negative for vomiting.    Allergies  Influenza vaccine live  Home Medications   Current Outpatient Rx  Name Route Sig Dispense Refill  . ALBUTEROL SULFATE HFA 108 (90 BASE) MCG/ACT IN AERS Inhalation Inhale 2 puffs into the lungs every 4 (four) hours as needed. Shortness of breath    . ASPIRIN 81 MG PO TABS Oral Take 81 mg by mouth daily.     [name] BENZONATATE 100 MG PO CAPS Oral Take 1 capsule (100 mg total) by mouth 3 (three) times daily as needed for cough. 20 capsule 0  . BUPROPION HCL ER (SR) 150 MG PO TB12 Oral Take 150 mg by mouth 2 (two) times daily.      [name] CETIRIZINE HCL 10 MG PO TABS Oral Take 10 mg by mouth daily.     [name] FLUTICASONE-SALMETEROL 250-50 MCG/DOSE IN AEPB Inhalation Inhale 1 puff into the lungs every 12 (twelve) hours.     . GUAIFENESIN ER 600 MG PO TB12 Oral Take 1 tablet (600 mg  total) by mouth 2 (two) times daily. 20 tablet 0  . MOXIFLOXACIN HCL 400 MG PO TABS Oral Take 1 tablet (400 mg total) by mouth daily. 10 tablet 0  . OMEPRAZOLE 20 MG PO CPDR Oral Take 20 mg by mouth at bedtime.     . OXYCODONE HCL 5 MG PO TABS Oral Take 1 tablet (5 mg total) by mouth every 4 (four) hours as needed for pain. 30 tablet 0  . PREDNISONE 10 MG PO TABS Oral Take 1 tablet (10 mg total) by mouth daily with breakfast. 5 tablet 0.    Take one 10 mg tablet for the next 3 days, then ta ...  . SIMVASTATIN 20 MG PO TABS Oral Take 20 mg by mouth at bedtime.       BP 115/89  Pulse 63  Temp(Src) 97.8 F (36.6 C) (Oral)  Resp 16  SpO2 92%  Physical Exam CONSTITUTIONAL: Well developed/well nourished HEAD AND FACE: Normocephalic/atraumatic EYES: EOMI/PERRL ENMT: Mucous membranes  moist NECK: supple no meningeal signs CV: S1/S2 noted, no murmurs/rubs/gallops noted LUNGS: Lungs are clear to auscultation bilaterally, no apparent distress ABDOMEN: soft, nontender, no rebound or guarding, +bs GU:no cva tenderness  Rectal: stool impaction, no mass, no abscess, chaperone present NEURO: Pt is awake/alert, moves all extremitiesx4 EXTREMITIES: pulses normal, full ROM SKIN: warm, color normal PSYCH: no abnormalities of mood noted ED Course  Fecal disimpaction Performed by: Joya Gaskins Authorized by: Joya Gaskins Consent: Verbal consent obtained. Consent given by: patient Patient understanding: patient states understanding of the procedure being performed Patient identity confirmed: verbally with patient Patient sedated: no Patient tolerance: Patient tolerated the procedure well with no immediate complications. Comments: Pt had fecal disimpaction, large amount of stool extracted Pt tolerated well No rectal mass/abscess noted Stool color normal   DIAGNOSTIC STUDIES: Oxygen Saturation is 92% on room air, adequate by my interpretation.  (pt on home O2)  COORDINATION OF CARE:  Pt improved after disimpaction, he felt improved, had BM while in the ED Stable for d/c    MDM  Nursing notes reviewed and considered in documentation Previous records reviewed and considered    I personally performed the services described in this documentation, which was scribed in my presence. The recorded information has been reviewed and considered.          Joya Gaskins, MD 06/25/11 [phone]

## 2011-06-25 NOTE — ED Notes (Signed)
Disimpaction performed by Dr. Bebe Shaggy. Pt to restroom at time of discharge. NAD.

## 2011-06-25 NOTE — ED Notes (Signed)
Pt states last BM was Saturday night.  Per pt he took 4 docusate sodium tablets yesterday without relief.

## 2011-06-25 NOTE — ED Notes (Signed)
Pt reports small amount of loose breakthrough stool with coughing and movement.

## 2011-06-25 NOTE — ED Notes (Signed)
Pt states he had a "good" BM and is no longer in pain.

## 2011-06-27 LAB — CULTURE, BLOOD (ROUTINE X 2)
Culture: NO GROWTH
Culture: NO GROWTH

## 2011-07-03 NOTE — Discharge Summary (Signed)
I have examined the patient and reviewed the chart. I agree with the above discharge summary. He is stable to be discharged.

## 2011-07-03 NOTE — Progress Notes (Signed)
I agree with the above plan to provide Home Oxygen to the patient.   Calvert Cantor MD

## 2011-07-09 ENCOUNTER — Ambulatory Visit (INDEPENDENT_AMBULATORY_CARE_PROVIDER_SITE_OTHER): Payer: Medicare Other | Admitting: Adult Health

## 2011-07-09 ENCOUNTER — Encounter: Payer: Self-pay | Admitting: Adult Health

## 2011-07-09 ENCOUNTER — Other Ambulatory Visit: Payer: Self-pay | Admitting: Thoracic Surgery

## 2011-07-09 DIAGNOSIS — I4891 Unspecified atrial fibrillation: Secondary | ICD-10-CM

## 2011-07-09 DIAGNOSIS — F17200 Nicotine dependence, unspecified, uncomplicated: Secondary | ICD-10-CM

## 2011-07-09 DIAGNOSIS — J984 Other disorders of lung: Secondary | ICD-10-CM

## 2011-07-09 DIAGNOSIS — D381 Neoplasm of uncertain behavior of trachea, bronchus and lung: Secondary | ICD-10-CM

## 2011-07-09 LAB — FUNGUS CULTURE W SMEAR

## 2011-07-09 NOTE — Patient Instructions (Signed)
Your physician recommends that you schedule a follow-up appointment in: 8 months  

## 2011-07-09 NOTE — Assessment & Plan Note (Signed)
Heart rate well controlled and regular. He is without cardiac complaint.  He is continued on ASA only. Will see him in 8 months unless symptomatic.

## 2011-07-09 NOTE — Assessment & Plan Note (Signed)
He has stopped smoking for 2 weeks. He states he just does not want to smoke anymore. They don't taste good to him. I have congratulated him on his cessation and encouraged him to continue this.

## 2011-07-09 NOTE — Assessment & Plan Note (Signed)
This is being followed by Dr Edwyna Shell pulmonologist. More recommendations once his biopsy has returned.

## 2011-07-09 NOTE — Progress Notes (Signed)
   HPI: Mr. Trimarco is a pleasant 50 y/o patient of Dr. Dietrich Pates we are seeing on hospital follow-up after admission for COPD exacerbation, acute respiratory faliure, and dehydration, with known history of Atrial fib and ongoing tobacco abuse. He was found to have abnormal CXR during hospitalization with a left lung cavitary lesion associated with scarring. He was treated for bronchitis with antibiotics as well.  He was tested for TB and found to be negative, bronchoscopy was completed with biopsy for Cancer. He has a follow-up with Dr. Edwyna Shell in January. From a cardiac standpoint, he is doing well. He does not complain of chest discomfort, dizziness or weakness. He is now on nocturnal O2 as he desaturated into the 80's when he was hospitalized. He has quit smoking for 2 weeks.  Allergies  Allergen Reactions  . Influenza Vaccine Live Swelling    Current Outpatient Prescriptions  Medication Sig Dispense Refill  . albuterol (VENTOLIN HFA) 108 (90 BASE) MCG/ACT inhaler Inhale 2 puffs into the lungs every 4 (four) hours as needed. Shortness of breath      . aspirin EC 81 MG tablet Take 81 mg by mouth daily.        [name] buPROPion (WELLBUTRIN SR) 150 MG 12 hr tablet Take 150 mg by mouth 2 (two) times daily.        . cetirizine (ZYRTEC) 10 MG tablet Take 10 mg by mouth daily.       . Fluticasone-Salmeterol (ADVAIR DISKUS) 250-50 MCG/DOSE AEPB Inhale 1 puff into the lungs every 12 (twelve) hours.       [name] omeprazole (PRILOSEC) 20 MG capsule Take 20 mg by mouth at bedtime.       . simvastatin (ZOCOR) 20 MG tablet Take 20 mg by mouth at bedtime.         Past Medical History  Diagnosis Date  . Emphysema   . COPD (chronic obstructive pulmonary disease)   . Hypercholesteremia   . Pneumonia   . Polycythemia   . Coronary artery disease   . Cavitary lesion of lung 05/07/2011  . Borderline diabetes   . Seizures     last seizure 2 yrs ago  . GERD (gastroesophageal reflux disease)     Past Surgical History    Procedure Date  . Angioplasty   . Colonoscopy w/ endoscopic [place]   . Vasectomy   . Throat biopsy   . Lung biopsy   . Vasectomy 1987  . Cardiac catheterization     XBJ:YNWGNF of systems complete and found to be negative unless listed above PHYSICAL EXAM BP 123/78  Pulse 83  Ht 5\' 8"  (1.727 m)  Wt 141 lb (63.957 kg)  BMI 21.44 kg/m2  SpO2 96%  General: Well developed, well nourished, in no acute distress Head: Eyes PERRLA, No xanthomas.   Normal cephalic and atramatic  Lungs: Wheezes are noted bilaterally.  Heart: HRRR S1 S2, without MRG.  Pulses are 2+ & equal.            No carotid bruit. No JVD.  No abdominal bruits. No femoral bruits. Abdomen: Bowel sounds are positive, abdomen soft and non-tender without masses or                  Hernia's noted. Msk:  Back normal, normal gait. Normal strength and tone for age. Extremities: No clubbing, cyanosis or edema.  DP +1 Neuro: Alert and oriented X 3. Psych:  Good affect, responds appropriately    ASSESSMENT AND PLAN

## 2011-07-16 ENCOUNTER — Ambulatory Visit
Admission: RE | Admit: 2011-07-16 | Discharge: 2011-07-16 | Disposition: A | Discharge status: Home / Self Care | Payer: Medicare Other | Source: Ambulatory Visit | Attending: Thoracic Surgery | Admitting: Thoracic Surgery

## 2011-07-16 ENCOUNTER — Ambulatory Visit (INDEPENDENT_AMBULATORY_CARE_PROVIDER_SITE_OTHER): Payer: Medicare Other | Admitting: Thoracic Surgery

## 2011-07-16 ENCOUNTER — Other Ambulatory Visit: Payer: Self-pay | Admitting: Thoracic Surgery

## 2011-07-16 ENCOUNTER — Encounter: Payer: Self-pay | Admitting: Thoracic Surgery

## 2011-07-16 VITALS — BP 124/81 | HR 93 | Resp 20 | Ht 68.0 in | Wt 140.0 lb

## 2011-07-16 DIAGNOSIS — D381 Neoplasm of uncertain behavior of trachea, bronchus and lung: Secondary | ICD-10-CM

## 2011-07-16 DIAGNOSIS — R599 Enlarged lymph nodes, unspecified: Secondary | ICD-10-CM | POA: Diagnosis not present

## 2011-07-16 DIAGNOSIS — D491 Neoplasm of unspecified behavior of respiratory system: Secondary | ICD-10-CM

## 2011-07-16 DIAGNOSIS — C3490 Malignant neoplasm of unspecified part of unspecified bronchus or lung: Secondary | ICD-10-CM | POA: Diagnosis not present

## 2011-07-16 DIAGNOSIS — J438 Other emphysema: Secondary | ICD-10-CM | POA: Diagnosis not present

## 2011-07-16 DIAGNOSIS — J984 Other disorders of lung: Secondary | ICD-10-CM

## 2011-07-16 NOTE — Progress Notes (Signed)
HPI patient in the hospital recently for a chronic obstructive pulmonary disease exacerbation. Chest x-ray today shows that the left upper lobe irregularity is unchanged in to my reading and maybe slightly improved. I'll we have had no culture results of for either fungus and TB. IIA is will continue short-term followup on him. I plan to see him back again in 1 month with a CT scan of the chest it. If the lesion is still the same or possibly unchanged and I will recommend a needle biopsy. Because of his chronic obstructive pulmonary disease I think a needle biopsy would be high risk. He will let us know if he gets readmitted to the hospital.   Current Outpatient Prescriptions  Medication Sig Dispense Refill  . albuterol (VENTOLIN HFA) 108 (90 BASE) MCG/ACT inhaler Inhale 2 puffs into the lungs every 4 (four) hours as needed. Shortness of breath      . aspirin EC 81 MG tablet Take 81 mg by mouth daily.        [name] buPROPion (WELLBUTRIN SR) 150 MG 12 hr tablet Take 150 mg by mouth 2 (two) times daily.        . cetirizine (ZYRTEC) 10 MG tablet Take 10 mg by mouth daily.       . Fluticasone-Salmeterol (ADVAIR DISKUS) 250-50 MCG/DOSE AEPB Inhale 1 puff into the lungs every 12 (twelve) hours.       [name] omeprazole (PRILOSEC) 20 MG capsule Take 20 mg by mouth at bedtime.       . simvastatin (ZOCOR) 20 MG tablet Take 20 mg by mouth at bedtime.          Review of Systems: Recent hospital admission   Physical Exam lungs have distant breath sounds were were clear to auscultation percussion   Diagnostic Tests: Chest x-ray showed left upper lobe irregularity unchanged   Impression: Left upper lobe lesion inflammatory versus chance   Plan: Return in one month with CT scan

## 2011-07-21 LAB — AFB CULTURE WITH SMEAR (NOT AT ARMC): Acid Fast Smear: NONE SEEN

## 2011-07-23 DIAGNOSIS — D45 Polycythemia vera: Secondary | ICD-10-CM | POA: Diagnosis not present

## 2011-07-23 DIAGNOSIS — E119 Type 2 diabetes mellitus without complications: Secondary | ICD-10-CM | POA: Diagnosis not present

## 2011-07-23 DIAGNOSIS — J449 Chronic obstructive pulmonary disease, unspecified: Secondary | ICD-10-CM | POA: Diagnosis not present

## 2011-07-23 DIAGNOSIS — E785 Hyperlipidemia, unspecified: Secondary | ICD-10-CM | POA: Diagnosis not present

## 2011-07-24 ENCOUNTER — Other Ambulatory Visit: Payer: Self-pay | Admitting: Cardiology

## 2011-07-24 DIAGNOSIS — R079 Chest pain, unspecified: Secondary | ICD-10-CM | POA: Diagnosis not present

## 2011-07-24 DIAGNOSIS — R222 Localized swelling, mass and lump, trunk: Secondary | ICD-10-CM | POA: Diagnosis not present

## 2011-07-24 DIAGNOSIS — J449 Chronic obstructive pulmonary disease, unspecified: Secondary | ICD-10-CM | POA: Diagnosis not present

## 2011-08-05 ENCOUNTER — Telehealth (HOSPITAL_COMMUNITY): Payer: Self-pay | Admitting: Dietician

## 2011-08-05 NOTE — Telephone Encounter (Signed)
Received referral from Dr. Nash Dimmer office for dx: diabetes. Appointment scheduled for 08/12/11 at 1:30 PM.

## 2011-08-12 ENCOUNTER — Encounter (HOSPITAL_COMMUNITY): Payer: Self-pay | Admitting: Dietician

## 2011-08-12 ENCOUNTER — Other Ambulatory Visit (HOSPITAL_COMMUNITY): Payer: Self-pay | Admitting: Oncology

## 2011-08-12 NOTE — Progress Notes (Signed)
Outpatient Initial Nutrition Assessment  Date:08/12/2011   Time: 2:00 PM  Referring Physician: Dr. Modesto Charon [facility]) Reason for Visit: diabetes  Nutrition Assessment:  Ht: 62" Wt: 143# IBW: 118# %IBW: 121% UBW: 130# %UBW: 110% BMI: 26.15 Goal Weight: Maintainence Weight hx: Pt reports UBW of 125-130#, but has weight increased dramatically in the past 6 months. He has gone from a 29" waist to 31" waist. Reports highest weight of 158# at age 21. Reports lowest weight of 120# "years ago".   Estimated nutritional needs: 1530-1669 kcals daily, 52-65 grams protein daily, 1.5-1.7 L fluid daily  PMH:  Past Medical History  Diagnosis Date  . Emphysema   . COPD (chronic obstructive pulmonary disease)   . Hypercholesteremia   . Pneumonia   . Polycythemia   . Coronary artery disease   . Cavitary lesion of lung 05/07/2011  . Borderline diabetes   . Seizures     last seizure 2 yrs ago  . GERD (gastroesophageal reflux disease)     Medications:  Current Outpatient Rx  Name Route Sig Dispense Refill  . ALBUTEROL SULFATE HFA 108 (90 BASE) MCG/ACT IN AERS Inhalation Inhale 2 puffs into the lungs every 4 (four) hours as needed. Shortness of breath    . ASPIRIN EC 81 MG PO TBEC Oral Take 81 mg by mouth daily.      . BUPROPION HCL ER (SR) 150 MG PO TB12 Oral Take 150 mg by mouth 2 (two) times daily.      [name] CETIRIZINE HCL 10 MG PO TABS Oral Take 10 mg by mouth daily.     [name] FLUTICASONE-SALMETEROL 250-50 MCG/DOSE IN AEPB Inhalation Inhale 1 puff into the lungs every 12 (twelve) hours.     [name] HYDROCODONE-ACETAMINOPHEN 5-325 MG PO TABS  TAKE 1 TABLET BY MOUTH EVERT 6 HOURS AS NEEDED FOR PAIN 20 tablet 0  . OMEPRAZOLE 20 MG PO CPDR Oral Take 20 mg by mouth at bedtime.     [name] SIMVASTATIN 20 MG PO TABS Oral Take 20 mg by mouth at bedtime.       Labs:  CMP     Component Value Date/Time   NA 139 06/23/2011 0555   K 4.6 06/23/2011 0555   CL 106 06/23/2011 0555   CO2 27 06/23/2011  0555   GLUCOSE 94 06/23/2011 0555   BUN 9 06/23/2011 0555   CREATININE 1.07 06/23/2011 0555   CALCIUM 8.2* 06/23/2011 0555   PROT 6.9 06/09/2011 1355   ALBUMIN 3.1* 06/09/2011 1355   AST 16 06/09/2011 1355   ALT 19 06/09/2011 1355   ALKPHOS 85 06/09/2011 1355   BILITOT 0.2* 06/09/2011 1355   GFRNONAA 78* 06/23/2011 0555   GFRAA >90 06/23/2011 0555     Lipid Panel     Component Value Date/Time   CHOL 131 06/23/2011 0555   TRIG 83 06/23/2011 0555   HDL 29* 06/23/2011 0555   CHOLHDL 4.5 06/23/2011 0555   VLDL 17 06/23/2011 0555   LDLCALC 85 06/23/2011 0555   LDLCALC 101 05/17/2010 0000     Lab Results  Component Value Date   HGBA1C 6.5* 06/22/2011   HGBA1C 6 05/17/2010   HGBA1C  Value: 5.8 (NOTE) The ADA recommends the following therapeutic goal for glycemic control related to Hgb A1c measurement: Goal of therapy: <6.5 Hgb A1c  Reference: American Diabetes Association: Clinical Practice Recommendations 2010, Diabetes Care, 2010, 33: (Suppl  1). 12/05/2008   Lab Results  Component Value Date   LDLCALC 85 06/23/2011  CREATININE 1.07 06/23/2011   Per Dr. Nash Dimmer report, Hgb A1c: 6.8.   Nutrition hx/habits: Ryan [name] is a very pleasant gentleman who is concerned about his blood sugar levels and increasing weight. 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 He lives in [place] with his wife, who is present today, and 1 teenage child. He also has two adult children who live nearby. He reports that he started back smoking 1.5 months ago, but intends on quitting again as smoking "makes me feel terrible". He does not participate in physical activity due to knee and back pain. He reports his stress level as very high, due to hyperactive personality and finances. He reports he has been prediabetic for years, controlling through diet alone. He does not currently check his glucose levels. He has made changes into his diet, such as eating whole wheat bread. His wife bakes all meats  and removes skin and fat from the meats. He does not like the taste of artificial sweeteners. He reports he believes diabetes was brought upon due to steroid use from dx of lung infection. He was prescribed Metformin, but discontinued it due to side effects of GI distress.   Diet recall: Breakfast: whole wheat toast, hash brown, eggs OR cheerios, Lunch: skips; Dinner: meat, mashed potatoes, vegetable. Pt drinks 1.5 pots of coffee with cream and sugar throughout the day.   Nutrition Diagnosis: Inconsistent carbohydrate intake r/t disordered eating pattern AEB pt skips lunch, Hgb A1c: 6.8.   Nutrition Intervention: Nutrition rx: 1500 kcal NAS, diabetic diet; 3 meals/day; limit 1 starch per meal; low calorie beverages only; no added sugar to beverages; physical activity as tolerated  Education/Counseling Provided: Educated pt on diabetic diet principles. Emphasized sources of carbohydrate, portion control, and plate method. Discussed healthy food preparation. Discussed importance of eating 3 meals per day. Provided plate method handout.   Understanding, Motivation, Ability to Follow Recommendations: Expect fair to good compliance.   Monitoring and Evaluation: Goals: 1) Weight maintenance; 2) Hgb A1c < 7.0; 3) 3 meals/day; 4) 3-5 servings of fruits and vegetables daily  Recommendations: 1) Pt will need rx for glucometer and strips if medically indicated; 2) Eat meals no more than 5 hours apart; 3) Consider drinking black coffee; 4) Purchase canned fruit in light syrup or canned in juice  F/U: PRN. Provided RD contact information.  Orlene Plum, RD  08/12/2011  Time: 2:00 PM

## 2011-09-02 ENCOUNTER — Encounter: Payer: Self-pay | Admitting: Thoracic Surgery

## 2011-09-02 ENCOUNTER — Ambulatory Visit (INDEPENDENT_AMBULATORY_CARE_PROVIDER_SITE_OTHER): Payer: Medicare Other | Admitting: Thoracic Surgery

## 2011-09-02 ENCOUNTER — Ambulatory Visit
Admission: RE | Admit: 2011-09-02 | Discharge: 2011-09-02 | Disposition: A | Discharge status: Home / Self Care | Payer: Medicare Other | Source: Ambulatory Visit | Attending: Thoracic Surgery | Admitting: Thoracic Surgery

## 2011-09-02 VITALS — BP 119/73 | HR 98 | Resp 16 | Ht 68.0 in | Wt 148.0 lb

## 2011-09-02 DIAGNOSIS — R911 Solitary pulmonary nodule: Secondary | ICD-10-CM | POA: Diagnosis not present

## 2011-09-02 DIAGNOSIS — D491 Neoplasm of unspecified behavior of respiratory system: Secondary | ICD-10-CM

## 2011-09-02 DIAGNOSIS — J984 Other disorders of lung: Secondary | ICD-10-CM | POA: Diagnosis not present

## 2011-09-02 DIAGNOSIS — Z09 Encounter for follow-up examination after completed treatment for conditions other than malignant neoplasm: Secondary | ICD-10-CM

## 2011-09-02 DIAGNOSIS — F17200 Nicotine dependence, unspecified, uncomplicated: Secondary | ICD-10-CM | POA: Diagnosis not present

## 2011-09-02 DIAGNOSIS — J852 Abscess of lung without pneumonia: Secondary | ICD-10-CM | POA: Diagnosis not present

## 2011-09-02 DIAGNOSIS — R222 Localized swelling, mass and lump, trunk: Secondary | ICD-10-CM | POA: Diagnosis not present

## 2011-09-02 DIAGNOSIS — J449 Chronic obstructive pulmonary disease, unspecified: Secondary | ICD-10-CM | POA: Diagnosis not present

## 2011-09-02 NOTE — Progress Notes (Signed)
HPI the patient returns today for followup. His CT scan shows improvement there still shows a left upper lobe infiltrate but this is decreased in size. Is complaining of some on and leg pain but no other symptoms. He'll be seen in Dr. Juanetta Gosling this afternoon. As his appetite has improved and he's feeling better. I will check him back in 3 months with a followup CT scan   Current Outpatient Prescriptions  Medication Sig Dispense Refill  . albuterol (VENTOLIN HFA) 108 (90 BASE) MCG/ACT inhaler Inhale 2 puffs into the lungs every 4 (four) hours as needed. Shortness of breath      . aspirin EC 81 MG tablet Take 81 mg by mouth daily.        . cetirizine (ZYRTEC) 10 MG tablet Take 10 mg by mouth daily.       . Fluticasone-Salmeterol (ADVAIR DISKUS) 250-50 MCG/DOSE AEPB Inhale 1 puff into the lungs every 12 (twelve) hours.       [name] HYDROcodone-acetaminophen (NORCO) 5-325 MG per tablet TAKE 1 TABLET BY MOUTH EVERT 6 HOURS AS NEEDED FOR PAIN  20 tablet  0  . metFORMIN (GLUCOPHAGE) 500 MG tablet Take 250 mg by mouth 2 (two) times daily with a meal.      . omeprazole (PRILOSEC) 20 MG capsule Take 20 mg by mouth at bedtime.       . simvastatin (ZOCOR) 20 MG tablet Take 20 mg by mouth at bedtime.       [name] tiotropium (SPIRIVA) 18 MCG inhalation capsule Place 18 mcg into inhaler and inhale daily.      [name] buPROPion (WELLBUTRIN SR) 150 MG 12 hr tablet Take 150 mg by mouth 2 (two) times daily.           Review of Systems: Complains of pain in the arm and leg   Physical Exam lungs are clear attestation percussion no wheezes   Diagnostic Tests: CT scan shows improvement in left upper lobe infiltrate.   Impression: Mycobacterial infection left upper lobe   Plan: Returns for followup in 3 months with CT scan

## 2011-09-11 ENCOUNTER — Encounter (HOSPITAL_COMMUNITY): Payer: Self-pay | Admitting: Emergency Medicine

## 2011-09-11 ENCOUNTER — Emergency Department (HOSPITAL_COMMUNITY): Payer: Medicare Other

## 2011-09-11 ENCOUNTER — Emergency Department (HOSPITAL_COMMUNITY)
Admission: EM | Admit: 2011-09-11 | Discharge: 2011-09-11 | Disposition: A | Discharge status: Home / Self Care | Payer: Medicare Other | Attending: Emergency Medicine | Admitting: Emergency Medicine

## 2011-09-11 ENCOUNTER — Other Ambulatory Visit: Payer: Self-pay

## 2011-09-11 DIAGNOSIS — R569 Unspecified convulsions: Secondary | ICD-10-CM | POA: Diagnosis not present

## 2011-09-11 DIAGNOSIS — J984 Other disorders of lung: Secondary | ICD-10-CM | POA: Diagnosis not present

## 2011-09-11 DIAGNOSIS — R200 Anesthesia of skin: Secondary | ICD-10-CM

## 2011-09-11 DIAGNOSIS — J449 Chronic obstructive pulmonary disease, unspecified: Secondary | ICD-10-CM | POA: Diagnosis not present

## 2011-09-11 DIAGNOSIS — R071 Chest pain on breathing: Secondary | ICD-10-CM | POA: Diagnosis not present

## 2011-09-11 DIAGNOSIS — R1110 Vomiting, unspecified: Secondary | ICD-10-CM | POA: Diagnosis not present

## 2011-09-11 DIAGNOSIS — I2510 Atherosclerotic heart disease of native coronary artery without angina pectoris: Secondary | ICD-10-CM | POA: Diagnosis not present

## 2011-09-11 DIAGNOSIS — E7800 Pure hypercholesterolemia, unspecified: Secondary | ICD-10-CM | POA: Diagnosis not present

## 2011-09-11 DIAGNOSIS — R0789 Other chest pain: Secondary | ICD-10-CM

## 2011-09-11 DIAGNOSIS — R222 Localized swelling, mass and lump, trunk: Secondary | ICD-10-CM | POA: Diagnosis not present

## 2011-09-11 DIAGNOSIS — M79609 Pain in unspecified limb: Secondary | ICD-10-CM | POA: Insufficient documentation

## 2011-09-11 DIAGNOSIS — R079 Chest pain, unspecified: Secondary | ICD-10-CM | POA: Insufficient documentation

## 2011-09-11 DIAGNOSIS — R42 Dizziness and giddiness: Secondary | ICD-10-CM | POA: Diagnosis not present

## 2011-09-11 DIAGNOSIS — E119 Type 2 diabetes mellitus without complications: Secondary | ICD-10-CM | POA: Insufficient documentation

## 2011-09-11 DIAGNOSIS — R209 Unspecified disturbances of skin sensation: Secondary | ICD-10-CM | POA: Insufficient documentation

## 2011-09-11 DIAGNOSIS — J4489 Other specified chronic obstructive pulmonary disease: Secondary | ICD-10-CM | POA: Insufficient documentation

## 2011-09-11 LAB — DIFFERENTIAL
Basophils Absolute: 0 10*3/uL (ref 0.0–0.1)
Basophils Relative: 0 % (ref 0–1)
Eosinophils Relative: 2 % (ref 0–5)
Monocytes Absolute: 0.7 10*3/uL (ref 0.1–1.0)
Neutro Abs: 6.5 10*3/uL (ref 1.7–7.7)

## 2011-09-11 LAB — BASIC METABOLIC PANEL
Calcium: 9 mg/dL (ref 8.4–10.5)
Chloride: 99 mEq/L (ref 96–112)
Creatinine, Ser: 0.97 mg/dL (ref 0.50–1.35)
GFR calc Af Amer: >90 mL/min (ref >90)
GFR calc non Af Amer: >90 mL/min (ref >90)

## 2011-09-11 LAB — POCT I-STAT TROPONIN I: Troponin i, poc: 0 ng/mL (ref 0.00–0.08)

## 2011-09-11 LAB — CBC
HCT: 40.8 % (ref 39.0–52.0)
MCHC: 33.8 g/dL (ref 30.0–36.0)
RDW: 14.9 % (ref 11.5–15.5)

## 2011-09-11 MED ORDER — OXYCODONE-ACETAMINOPHEN 5-325 MG PO TABS
2.0000 | ORAL_TABLET | Freq: Once | ORAL | Status: AC
  Administered 2011-09-11: 2 via ORAL
  Filled 2011-09-11: qty 2

## 2011-09-11 NOTE — ED Notes (Signed)
Patient states he started having bilateral side pain, left chest pain that radiates to back, nausea and light emesis, dizziness and lightheadedness last night. Also complaining of blurry vision, left arm pain shooting from fingers to elbow.

## 2011-09-11 NOTE — ED Provider Notes (Signed)
History  Scribed for [employer]. Colon Branch, MD, the patient was seen in [facility]. The chart was scribed by Gilman Schmidt. The patients care was started at 10:18 PM.   CSN: [id number]  Arrival date & time 09/11/11  2131   First MD Initiated Contact with Patient 09/11/11 2207      Chief Complaint  Patient presents with  . Dizziness  . Chest Pain  . Emesis    (Consider location/radiation/quality/duration/timing/severity/associated sxs/prior treatment) HPI Ryan [name] is a 51 y.o. male with a history of multiple illnesses including Emphysema, COPD, CAD, and Cavitary lesion of lung who presents to the Emergency Department complaining of left sided chest pain onset yesterday. Pt also notes back pain, and pain in 2nd and 3rd finger of left hand radiating to numbness with associated numbness. Also notes chills (one month) and cough. Pt said he did not take meds yesterday. Reports having eye exam yesterday and states he has cataracts in left eye. There are no other associated symptoms and no other alleviating or aggravating factors.   PCP: Dr. Modesto Charon  Past Medical History  Diagnosis Date  . Emphysema   . COPD (chronic obstructive pulmonary disease)   . Hypercholesteremia   . Pneumonia   . Polycythemia   . Coronary artery disease   . Cavitary lesion of lung 05/07/2011  . Borderline diabetes   . Seizures     last seizure 2 yrs ago  . GERD (gastroesophageal reflux disease)   . Diabetes mellitus     Past Surgical History  Procedure Date  . Angioplasty   . Colonoscopy w/ endoscopic [place]   . Vasectomy   . Throat biopsy   . Lung biopsy   . Vasectomy 1987  . Cardiac surgery     Family History  Problem Relation Age of Onset  . Hypertension Mother   . Diabetes Mother   . Heart attack Mother   . Heart failure Mother   . Hypertension Sister   . Diabetes Sister   . Heart failure Sister     History  Substance Use Topics  . Smoking status: Current Everyday Smoker -- 0.2 packs/day for  45 years    Types: Cigarettes    Last Attempt to Quit: 06/22/2011  . Smokeless tobacco: Former Neurosurgeon  . Alcohol Use: No      Review of Systems  Eyes:       Blurry vision   Cardiovascular: Positive for chest pain.  Gastrointestinal: Positive for vomiting.  Musculoskeletal: Positive for back pain.       Arm pain  Neurological: Positive for dizziness and light-headedness.  All other systems reviewed and are negative.    Allergies  Influenza vaccine live  Home Medications   Current Outpatient Rx  Name Route Sig Dispense Refill  . ALBUTEROL SULFATE HFA 108 (90 BASE) MCG/ACT IN AERS Inhalation Inhale 2 puffs into the lungs every 4 (four) hours as needed. Shortness of breath    . ASPIRIN EC 81 MG PO TBEC Oral Take 81 mg by mouth daily.      [name] CETIRIZINE HCL 10 MG PO TABS Oral Take 10 mg by mouth daily.     [name] EZETIMIBE 10 MG PO TABS Oral Take 10 mg by mouth daily.    [name] FLUTICASONE-SALMETEROL 250-50 MCG/DOSE IN AEPB Inhalation Inhale 1 puff into the lungs every 12 (twelve) hours.     [name] METFORMIN HCL 500 MG PO TABS Oral Take 250 mg by mouth 2 (two) times daily with a meal.    .  OMEPRAZOLE 20 MG PO CPDR Oral Take 20 mg by mouth at bedtime.     [name] SIMVASTATIN 20 MG PO TABS Oral Take 20 mg by mouth at bedtime.     [name] TIOTROPIUM BROMIDE MONOHYDRATE 18 MCG IN CAPS Inhalation Place 18 mcg into inhaler and inhale daily.      BP 130/75  Pulse 103  Temp(Src) 98.2 F (36.8 C) (Oral)  Ht 5\' 7"  (1.702 m)  Wt 148 lb (67.132 kg)  BMI 23.18 kg/m2  SpO2 93%  Physical Exam  Constitutional: He is oriented to person, place, and time. He appears well-developed and well-nourished.  HENT:  Head: Normocephalic and atraumatic.  Eyes: Conjunctivae are normal. Pupils are equal, round, and reactive to light.  Neck: Neck supple. No tracheal deviation present. No thyromegaly present.  Cardiovascular: Normal rate and regular rhythm.   No murmur heard. Pulmonary/Chest: Effort normal and breath sounds  normal.       Few crackles in base (on left more than right) Focal tenderness to left costal side with palpation No crepitus or deformities   Abdominal: Soft. Bowel sounds are normal. He exhibits no distension. There is no tenderness.  Musculoskeletal: Normal range of motion. He exhibits no edema and no tenderness.  Neurological: He is alert and oriented to person, place, and time. He has normal reflexes. No cranial nerve deficit. Coordination normal.       Sensation to light touch normal in both hands, arms  Skin: Skin is warm and dry. No rash noted.  Psychiatric: He has a normal mood and affect.    ED Course  Procedures (including critical care time)   DIAGNOSTIC STUDIES: Oxygen Saturation is 93% on [place], low by my interpretation.     Date: 09/11/2011  2148  Rate:92  Rhythm: normal sinus rhythm and sinus arrhythmia  QRS Axis: normal  Intervals: normal  ST/T Wave abnormalities: normal  Conduction Disutrbances:none  Narrative Interpretation:   Old EKG Reviewed: unchanged c/w  06/22/11  LABS Results for orders placed during the hospital encounter of 09/11/11  CBC      Component Value Range   WBC 9.2  4.0 - 10.5 (K/uL)   RBC 5.16  4.22 - 5.81 (MIL/uL)   Hemoglobin 13.8  13.0 - 17.0 (g/dL)   HCT 40.9  81.1 - 91.4 (%)   MCV 79.1  78.0 - 100.0 (fL)   MCH 26.7  26.0 - 34.0 (pg)   MCHC 33.8  30.0 - 36.0 (g/dL)   RDW 78.2  95.6 - 21.3 (%)   Platelets 241  150 - 400 (K/uL)  DIFFERENTIAL      Component Value Range   Neutrophils Relative 71  43 - 77 (%)   Neutro Abs 6.5  1.7 - 7.7 (K/uL)   Lymphocytes Relative 20  12 - 46 (%)   Lymphs Abs 1.9  0.7 - 4.0 (K/uL)   Monocytes Relative 8  3 - 12 (%)   Monocytes Absolute 0.7  0.1 - 1.0 (K/uL)   Eosinophils Relative 2  0 - 5 (%)   Eosinophils Absolute 0.1  0.0 - 0.7 (K/uL)   Basophils Relative 0  0 - 1 (%)   Basophils Absolute 0.0  0.0 - 0.1 (K/uL)  BASIC METABOLIC PANEL      Component Value Range   Sodium 133 (*) 135 - 145 (mEq/L)    Potassium 3.9  3.5 - 5.1 (mEq/L)   Chloride 99  96 - 112 (mEq/L)   CO2 23  19 - 32 (  mEq/L)   Glucose, Bld 117 (*) 70 - 99 (mg/dL)   BUN 13  6 - 23 (mg/dL)   Creatinine, Ser 2.72  0.50 - 1.35 (mg/dL)   Calcium 9.0  8.4 - 53.6 (mg/dL)   GFR calc non Af Amer >90  >90 (mL/min)   GFR calc Af Amer >90  >90 (mL/min)  POCT I-STAT TROPONIN I      Component Value Range   Troponin i, poc 0.00  0.00 - 0.08 (ng/mL)   Comment 3           GLUCOSE, CAPILLARY      Component Value Range   Glucose-Capillary 102 (*) 70 - 99 (mg/dL)    Radiology: DG Chest 1 View. Reviewed by me.  IMPRESSION: Left upper lobe mass like lesion grossly stable from January. No definite active infiltrates. Original Report Authenticated By: Elsie Stain, M.D.   COORDINATION OF CARE: 10:18pm:  - Patient evaluated by ED physician, DG Chest, Glucose, POCT, CBC, Diff, BMP, POCT, I-stat, EKG ordered     MDM  Patient with h/o left lung infiltrate/mass that is resolving, here with chest discomfort, coughing and left arm/finger numbness. Labs are unremarkable. Chest xray shows a stable and improving left lung infiltrate/mass. EKG is normal. Given analgesic. Pt stable in ED with no significant deterioration in condition.The patient appears reasonably screened and/or stabilized for discharge and I doubt any other medical condition or other [facility] requiring further screening, evaluation, or treatment in the ED at this time prior to discharge.  I personally performed the services described in this documentation, which was scribed in my presence. The recorded information has been reviewed and considered.  MDM Reviewed: nursing note, vitals and previous chart Reviewed previous: labs, ECG, x-ray and CT scan Interpretation: labs, ECG and x-ray         Nicoletta Dress. Colon Branch, MD 09/11/11 [phone]

## 2011-09-11 NOTE — ED Notes (Signed)
Pt reports side pain, chest pain, that radiates down arms. Pt also reports a productive cough. Denies fever.

## 2011-09-11 NOTE — ED Notes (Signed)
Pt alert & oriented x4, stable gait. Pt given discharge instructions, paperwork. Patient instructed to stop at the registration desk to finish any additional paperwork. pt verbalized understanding. Pt left department w/ no further questions.  

## 2011-09-11 NOTE — Discharge Instructions (Signed)
YOUR BLOOD WORK, EKG AND CHEST XRAY WERE ALL GOOD HERE TONIGHT. USE YOUR HOME MEDICINES. FOLLOW UP WITH YOUR DOCTOR.   Chest Pain (Nonspecific) Chest pain has many causes. Your pain could be caused by something serious, such as a heart attack or a blood clot in the lungs. It could also be caused by something less serious, such as a chest bruise or a virus. Follow up with your doctor. More lab tests or other studies may be needed to find the cause of your pain. Most of the time, nonspecific chest pain will improve within 2 to 3 days of rest and mild pain medicine. HOME CARE  For chest bruises, you may put ice on the sore area for 15 to 20 minutes, 3 to 4 times a day. Do this only if it makes you or your child feel better.   Put ice in a plastic bag.   Place a towel between the skin and the bag.   Rest for the next 2 to 3 days.   Go back to work if the pain improves.   See your doctor if the pain lasts longer than 1 to 2 weeks.   Only take medicine as told by your doctor.   Quit smoking if you smoke.  GET HELP RIGHT AWAY IF:   There is more pain or pain that spreads to the arm, neck, jaw, back, or belly (abdomen).   You or your child has shortness of breath.   You or your child coughs more than usual or coughs up blood.   You or your child has very bad back or belly pain, feels sick to his or her stomach (nauseous), or throws up (vomits).   You or your child has very bad weakness.   You or your child passes out (faints).   You or your child has a temperature by mouth above 102 F (38.9 C), not controlled by medicine.  MAKE SURE YOU:   Understand these instructions.   Will watch this condition.   Will get help right away if you or your child is not doing well or gets worse.  Document Released: 12/17/2007 Document Revised: 03/12/2011 Document Reviewed: 12/17/2007 [facility] Patient Information 2012 [place], [place].

## 2011-10-14 DIAGNOSIS — J449 Chronic obstructive pulmonary disease, unspecified: Secondary | ICD-10-CM | POA: Diagnosis not present

## 2011-10-22 DIAGNOSIS — R042 Hemoptysis: Secondary | ICD-10-CM | POA: Diagnosis not present

## 2011-10-22 DIAGNOSIS — E119 Type 2 diabetes mellitus without complications: Secondary | ICD-10-CM | POA: Diagnosis not present

## 2011-10-22 DIAGNOSIS — J449 Chronic obstructive pulmonary disease, unspecified: Secondary | ICD-10-CM | POA: Diagnosis not present

## 2011-10-22 DIAGNOSIS — E785 Hyperlipidemia, unspecified: Secondary | ICD-10-CM | POA: Diagnosis not present

## 2011-10-23 ENCOUNTER — Other Ambulatory Visit: Payer: Self-pay | Admitting: Thoracic Surgery

## 2011-10-23 DIAGNOSIS — D491 Neoplasm of unspecified behavior of respiratory system: Secondary | ICD-10-CM

## 2011-10-27 ENCOUNTER — Ambulatory Visit
Admission: RE | Admit: 2011-10-27 | Discharge: 2011-10-27 | Disposition: A | Discharge status: Home / Self Care | Payer: Medicare Other | Source: Ambulatory Visit | Attending: Family Medicine | Admitting: Family Medicine

## 2011-10-27 ENCOUNTER — Other Ambulatory Visit: Payer: Self-pay | Admitting: Family Medicine

## 2011-10-27 DIAGNOSIS — M542 Cervicalgia: Secondary | ICD-10-CM

## 2011-10-27 DIAGNOSIS — M47812 Spondylosis without myelopathy or radiculopathy, cervical region: Secondary | ICD-10-CM | POA: Diagnosis not present

## 2011-10-27 DIAGNOSIS — M79609 Pain in unspecified limb: Secondary | ICD-10-CM | POA: Diagnosis not present

## 2011-10-27 DIAGNOSIS — H2510 Age-related nuclear cataract, unspecified eye: Secondary | ICD-10-CM | POA: Diagnosis not present

## 2011-11-04 NOTE — Telephone Encounter (Signed)
Did not make a phone call-looked in chart for phone#.//L. Emrey Thornley,RN

## 2011-11-05 DIAGNOSIS — R222 Localized swelling, mass and lump, trunk: Secondary | ICD-10-CM | POA: Diagnosis not present

## 2011-11-05 DIAGNOSIS — J189 Pneumonia, unspecified organism: Secondary | ICD-10-CM | POA: Diagnosis not present

## 2011-11-12 ENCOUNTER — Ambulatory Visit (INDEPENDENT_AMBULATORY_CARE_PROVIDER_SITE_OTHER): Payer: Medicare Other | Admitting: Thoracic Surgery

## 2011-11-12 ENCOUNTER — Encounter: Payer: Self-pay | Admitting: Thoracic Surgery

## 2011-11-12 ENCOUNTER — Ambulatory Visit
Admission: RE | Admit: 2011-11-12 | Discharge: 2011-11-12 | Disposition: A | Discharge status: Home / Self Care | Payer: Medicare Other | Source: Ambulatory Visit | Attending: Thoracic Surgery | Admitting: Thoracic Surgery

## 2011-11-12 VITALS — BP 113/77 | HR 89 | Resp 16 | Ht 68.0 in | Wt 138.0 lb

## 2011-11-12 DIAGNOSIS — D491 Neoplasm of unspecified behavior of respiratory system: Secondary | ICD-10-CM

## 2011-11-12 DIAGNOSIS — R222 Localized swelling, mass and lump, trunk: Secondary | ICD-10-CM | POA: Diagnosis not present

## 2011-11-12 DIAGNOSIS — J984 Other disorders of lung: Secondary | ICD-10-CM | POA: Diagnosis not present

## 2011-11-12 DIAGNOSIS — J439 Emphysema, unspecified: Secondary | ICD-10-CM | POA: Diagnosis not present

## 2011-11-12 DIAGNOSIS — R918 Other nonspecific abnormal finding of lung field: Secondary | ICD-10-CM

## 2011-11-12 NOTE — Progress Notes (Signed)
HPI patient returns for followup today. There is a further decrease in the left upper lobe mass. This with MAI. He still has occasional low-grade hemoptysis. But the mass infiltrate has decreased to 20 x 33 mm last dose still feel we need to follow him just to make sure this is not a cancer. I explained the findings to the patient and his wife.. He has recently received another course of antibiotics. I will have him see him again in 3 months with another CT scan. Dr. Dorris Fetch will followup.   Current Outpatient Prescriptions  Medication Sig Dispense Refill  . albuterol (VENTOLIN HFA) 108 (90 BASE) MCG/ACT inhaler Inhale 2 puffs into the lungs every 4 (four) hours as needed. Shortness of breath      . aspirin EC 81 MG tablet Take 81 mg by mouth daily.        . cetirizine (ZYRTEC) 10 MG tablet Take 10 mg by mouth daily.       [name] ezetimibe (ZETIA) 10 MG tablet Take 10 mg by mouth daily.      . Fluticasone-Salmeterol (ADVAIR DISKUS) 250-50 MCG/DOSE AEPB Inhale 1 puff into the lungs every 12 (twelve) hours.       [name] HYDROcodone-acetaminophen (NORCO) 5-325 MG per tablet Take 1 tablet by mouth every 6 (six) hours as needed.      . metFORMIN (GLUCOPHAGE) 500 MG tablet Take 250 mg by mouth 2 (two) times daily with a meal.      . omeprazole (PRILOSEC) 20 MG capsule Take 20 mg by mouth at bedtime.       . simvastatin (ZOCOR) 20 MG tablet Take 20 mg by mouth at bedtime.       [name] tiotropium (SPIRIVA) 18 MCG inhalation capsule Place 18 mcg into inhaler and inhale daily.         Review of Systems: Persistent low-grade hemoptysis. 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

## 2011-11-19 ENCOUNTER — Encounter: Payer: Self-pay | Admitting: Orthopedic Surgery

## 2011-11-19 ENCOUNTER — Ambulatory Visit (INDEPENDENT_AMBULATORY_CARE_PROVIDER_SITE_OTHER): Payer: Medicare Other

## 2011-11-19 ENCOUNTER — Ambulatory Visit (INDEPENDENT_AMBULATORY_CARE_PROVIDER_SITE_OTHER): Payer: Medicare Other | Admitting: Orthopedic Surgery

## 2011-11-19 VITALS — BP 90/60 | Ht 68.0 in | Wt 138.0 lb

## 2011-11-19 DIAGNOSIS — M25519 Pain in unspecified shoulder: Secondary | ICD-10-CM | POA: Diagnosis not present

## 2011-11-19 DIAGNOSIS — M47812 Spondylosis without myelopathy or radiculopathy, cervical region: Secondary | ICD-10-CM | POA: Insufficient documentation

## 2011-11-19 MED ORDER — HYDROCODONE-ACETAMINOPHEN 5-325 MG PO TABS: 1.0000 | ORAL_TABLET | Freq: Four times a day (QID) | ORAL | Status: AC | PRN

## 2011-11-19 NOTE — Patient Instructions (Addendum)
Call [facility] arrange Phys Therapy Degenerative Disc Disease Degenerative disc disease is a condition caused by the changes that occur in the cushions of the backbone (spinal discs) as you grow older. Spinal discs are soft and compressible discs located between the bones of the spine (vertebrae). They act like shock absorbers. Degenerative disc disease can affect the wholespine. However, the neck and lower back are most commonly affected. Many changes can occur in the spinal discs with aging, such as:  The spinal discs may dry and shrink.   Small tears may occur in the tough, outer covering of the disc (annulus).   The disc space may become smaller due to loss of water.   Abnormal growths in the bone (spurs) may occur. This can put pressure on the nerve roots exiting the spinal canal, causing pain.   The spinal canal may become narrowed.  CAUSES   Degenerative disc disease is a condition caused by the changes that occur in the spinal discs with aging. The exact cause is not known, but there is a genetic basis for many patients. Degenerative changes can occur due to loss of fluid in the disc. This makes the disc thinner and reduces the space between the backbones. Small cracks can develop in the outer layer of the disc. This can lead to the breakdown of the disc. You are more likely to get degenerative disc disease if you are overweight. Smoking cigarettes and doing heavy work such as weightlifting can also increase your risk of this condition. Degenerative changes can start after a sudden injury. Growth of bone spurs can compress the nerve roots and cause pain.   SYMPTOMS   The symptoms vary from person to person. Some people may have no pain, while others have severe pain. The pain may be so severe that it can limit your activities. The location of the pain depends on the part of your backbone that is affected. You will have neck or arm pain if a disc in the neck area is affected. You will have pain in  your back, buttocks, or legs if a disc in the lower back is affected. The pain becomes worse while bending, reaching up, or with twisting movements. The pain may start gradually and then get worse as time passes. It may also start after a major or minor injury. You may feel numbness or tingling in the arms or legs.   DIAGNOSIS   Your caregiver will ask you about your symptoms and about activities or habits that may cause the pain. He or she may also ask about any injuries, diseases, ortreatments you have had earlier. Your caregiver will examine you to check for the range of movement that is possible in the affected area, to check for strength in your extremities, and to check for sensation in the areas of the arms and legs supplied by different nerve roots. An X-ray of the spine may be taken. Your caregiver may suggest other imaging tests, such as a computerized magnetic scan (MRI), if needed.   TREATMENT   Treatment includes rest, modifying your activities, and applying ice and heat. Your caregiver may prescribe medicines to reduce your pain and may ask you to do some exercises to strengthen your back. In some cases, you may need surgery. You and your caregiver will decide on the treatment that is best for you. HOME CARE INSTRUCTIONS    Follow proper lifting and walking techniques as advised by your caregiver.   Maintain good posture.   Exercise  regularly as advised.   Perform relaxation exercises.   Change your sitting, standing, and sleeping habits as advised. Change positions frequently.   Lose weight as advised.   Stop smoking if you smoke.   Wear supportive footwear.  SEEK MEDICAL CARE IF:   The pain does not go away within 1 to 4 weeks. SEEK IMMEDIATE MEDICAL CARE IF:    The pain is severe.   You notice weakness in your arms, hands, or legs.   You begin to lose control of your bladder or bowel.  MAKE SURE YOU:    Understand these instructions.   Will watch your condition.     Will get help right away if you are not doing well or get worse.  Document Released: 04/27/2007 Document Revised: 06/19/2011 Document Reviewed: 04/27/2007 [facility] Patient Information 2012 [place], [place].

## 2011-11-19 NOTE — Progress Notes (Signed)
  Subjective:     Ryan [name] is a 51 y.o. male who presents for evaluation of neck pain and shoulder pain. Event that precipitated these symptoms: none known. Onset of symptoms was 2 months ago, and have been gradually worsening since that time. Current symptoms are numbness in left long and ring finger , pain in left arm  (numbing, tingling and burning in character; 10/10 in severity), paresthesias in left arm, stiffness in left shoulder and weakness in left arm.  Patient has had no prior neck problems. Previous treatments: none.  The following portions of the patient's history were reviewed and updated as appropriate: allergies, current medications, past family history, past medical history, past social history, past surgical history and problem list.  Review of systems is noted for chills. Patient had a recently diagnosed lung infection, treated with antibiotics currently in treatment.  Complaint of shortness of breath from his COPD he has a history of heartburn. He has some temperature intolerance exam, history of seizure. Review of Systems Pertinent items are noted in HPI.    Objective:    BP 90/60  Ht 5\' 8"  (1.727 m)  Wt 138 lb (62.596 kg)  BMI 20.98 kg/m2 General:   alert, cooperative, appears older than stated age and mild distress  External Deformity:  absent  ROM Cervical Spine:  limited flexion, extension, right rotation, left rotation, right lateral flexion and left lateral flexion  Midline Tenderness:  moderate midline  Paraspinous tenderness:  moderate midline  UE Neurologic Exam:  normal strength, normal sensation, normal reflexes   X-ray of the cervical spine: Positive findings: DJD C spine     Assessment:    Cervical pain secondary to degenerative disk disease    Plan:    Discussed the cervical pain, its course and treatment. Agricultural engineer distributed. Oral opioids started per medication orders. PT referral. Follow up in  7 weeks.

## 2011-11-26 ENCOUNTER — Ambulatory Visit (HOSPITAL_COMMUNITY): Payer: Medicare Other | Admitting: Oncology

## 2011-11-27 ENCOUNTER — Ambulatory Visit (HOSPITAL_COMMUNITY): Payer: Medicare Other | Admitting: Oncology

## 2011-12-01 ENCOUNTER — Ambulatory Visit (HOSPITAL_COMMUNITY)
Admission: RE | Admit: 2011-12-01 | Discharge: 2011-12-01 | Disposition: A | Discharge status: Home / Self Care | Payer: Medicare Other | Source: Ambulatory Visit | Attending: Orthopedic Surgery | Admitting: Orthopedic Surgery

## 2011-12-01 DIAGNOSIS — IMO0001 Reserved for inherently not codable concepts without codable children: Secondary | ICD-10-CM | POA: Insufficient documentation

## 2011-12-01 DIAGNOSIS — M6281 Muscle weakness (generalized): Secondary | ICD-10-CM | POA: Insufficient documentation

## 2011-12-01 DIAGNOSIS — M542 Cervicalgia: Secondary | ICD-10-CM | POA: Diagnosis not present
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
## 2011-12-02 DIAGNOSIS — IMO0002 Reserved for concepts with insufficient information to code with codable children: Secondary | ICD-10-CM | POA: Diagnosis not present

## 2011-12-02 DIAGNOSIS — H2510 Age-related nuclear cataract, unspecified eye: Secondary | ICD-10-CM | POA: Diagnosis not present

## 2011-12-03 ENCOUNTER — Ambulatory Visit (HOSPITAL_COMMUNITY)
Admission: RE | Admit: 2011-12-03 | Discharge: 2011-12-03 | Disposition: A | Discharge status: Home / Self Care | Payer: Medicare Other | Source: Ambulatory Visit | Attending: Orthopedic Surgery | Admitting: Orthopedic Surgery

## 2011-12-03 DIAGNOSIS — M6281 Muscle weakness (generalized): Secondary | ICD-10-CM | POA: Diagnosis not present

## 2011-12-03 DIAGNOSIS — IMO0001 Reserved for inherently not codable concepts without codable children: Secondary | ICD-10-CM | POA: Diagnosis not present

## 2011-12-03 DIAGNOSIS — M542 Cervicalgia: Secondary | ICD-10-CM | POA: Diagnosis not present

## 2011-12-03 NOTE — Progress Notes (Signed)
Physical Therapy Treatment Patient Details  Name: Ryan [name] MRN: [id number] Date of Birth: Sep 05, 1960  Today's Date: 12/03/2011 Time: 4098-1191 PT Time Calculation (min): 34 min  Visit#: 2  of 18   Charges: Therex x 18' Manual x 10'   Subjective: Symptoms/Limitations Symptoms: Pt states that he had eye surgery yesterday so he was unable to do any exercises but he plans to start. Pain Assessment Currently in Pain?: No/denies   Exercise/Treatments Stretches Shoulder Rolls: 10x Neck Exercises Neck Retraction: 10 reps Shoulder Extension: 10 reps;Theraband Theraband Level (Shoulder Extension): Level 3 (Green) Row: 10 reps;Theraband Theraband Level (Row): Level 3 (Green) Scapular Retraction: 10 reps;Theraband Theraband Level (Scapular Retraction): Level 3 (Green) W Back: 15 reps X to V: 10 reps Additional Neck Exercises UBE (Upper Arm Bike): 4'@1 .0   Physical Therapy Assessment and Plan PT Assessment and Plan Clinical Impression Statement: Pt 53' late for appointment. Pt displays difficulty with cervical retraction secondary to weakness. Pt requires manual facilitation to create scapular mm contraction with tband exercises. Pt requires frequent cueing to avoid scapular elevation throughout therex. Tightness and mm spasms noted throughout L cervical area. PT Plan: Continue to progress per PT POC.  If pt does not improve in the next week begin cervical traction per PT.     Problem List Patient Active Problem List  Diagnoses  . HYPERLIPIDEMIA  . TOBACCO ABUSE  . ATRIAL FIBRILLATION  . ASTHMA  . COPD  . GERD with stricture  . ASCVD (arteriosclerotic cardiovascular disease)  . Glucose intolerance (pre-diabetes)  . 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 Cavitary lesion of lung  . SOB (shortness of breath)  . Nausea vomiting and diarrhea  . Dehydration  . Cough  . Decompensated COPD with exacerbation (chronic obstructive pulmonary disease)  . Abdominal pain  .  Acute respiratory failure with hypoxia  . Pulmonary cavitary lesion  . Spondylosis, cervical  . Shoulder pain    PT - End of Session Activity Tolerance: Patient tolerated treatment well General Behavior During Session: [facility] for tasks performed Cognition: [facility] for tasks performed    Seth Bake, PTA 12/03/2011, 10:03 AM

## 2011-12-05 ENCOUNTER — Ambulatory Visit (HOSPITAL_COMMUNITY)
Admission: RE | Admit: 2011-12-05 | Discharge: 2011-12-05 | Disposition: A | Discharge status: Home / Self Care | Payer: Medicare Other | Source: Ambulatory Visit | Attending: Physical Therapy | Admitting: Physical Therapy

## 2011-12-05 DIAGNOSIS — M6281 Muscle weakness (generalized): Secondary | ICD-10-CM | POA: Diagnosis not present

## 2011-12-05 DIAGNOSIS — IMO0001 Reserved for inherently not codable concepts without codable children: Secondary | ICD-10-CM | POA: Diagnosis not present

## 2011-12-05 DIAGNOSIS — M542 Cervicalgia: Secondary | ICD-10-CM | POA: Diagnosis not present

## 2011-12-05 NOTE — Progress Notes (Addendum)
Physical Therapy Treatment Patient Details  Name: Ryan [name] MRN: [id number] Date of Birth: [birth year]  Today's Date: 12/05/2011 Time: 8469-6295 PT Time Calculation (min): 42 min  Visit#: 3  of 18   Re-eval: 12/31/11   Subjective: Symptoms/Limitations Symptoms: Pt states he has been doing his exercises at home.  States that he does not have the pain in his left arm that he did. Pain Assessment Pain Score:   7 Pain Location: Neck Pain Type: Chronic pain Pain Onset: More than a month ago Pain Frequency: Intermittent   Exercise/Treatments  Stretches Shoulder Rolls: 10x Neck Exercises Neck Retraction: 10 reps Shoulder Extension: 10 reps;Theraband Theraband Level (Shoulder Extension): Level 3 (Green) Row: 10 reps;Theraband Theraband Level (Row): Level 3 (Green) Scapular Retraction: 10 reps;Theraband Theraband Level (Scapular Retraction): Level 3 (Green) W Back: 15 reps;Weight X to V: 10 reps Additional Neck Exercises Wall Pushups/Modified Pushups:  (10) UBE (Upper Arm Bike): 4'@1 .0  Manual Therapy Manual Therapy: Massage Massage: cervical and upper/mid trap area B.   Physical Therapy Assessment and Plan PT Assessment and Plan Clinical Impression Statement: Pt has improved technique with exercises but still needs verbal and physical cuing to complete exercises correctly.  Spasms are gone from cervical area but B tightness especially  C6-T2 area noted. Rehab Potential: Good PT Frequency: Min 3X/week PT Duration: 6 weeks PT Plan: begin prone chin tuck head lift, rows and scapular extension next treatment.     Charge:  There ex 23; massage 20 Goals    Problem List Patient Active Problem List  Diagnoses  . HYPERLIPIDEMIA  . TOBACCO ABUSE  . ATRIAL FIBRILLATION  . ASTHMA  . COPD  . GERD with stricture  . ASCVD (arteriosclerotic cardiovascular disease)  . Glucose intolerance (pre-diabetes)  . 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 Cavitary lesion of lung   . SOB (shortness of breath)  . Nausea vomiting and diarrhea  . Dehydration  . Cough  . Decompensated COPD with exacerbation (chronic obstructive pulmonary disease)  . Abdominal pain  . Acute respiratory failure with hypoxia  . Pulmonary cavitary lesion  . Spondylosis, cervical  . Shoulder pain    PT - End of Session Activity Tolerance: Patient tolerated treatment well General Behavior During Session: [facility] for tasks performed Cognition: [facility] for tasks performed PT Plan of Care Consulted and Agree with Plan of Care: Patient  GP No functional reporting required  Cloe Sockwell,CINDY 12/05/2011, 1:46 PM

## 2011-12-10 ENCOUNTER — Ambulatory Visit (HOSPITAL_COMMUNITY)
Admission: RE | Admit: 2011-12-10 | Discharge: 2011-12-10 | Disposition: A | Discharge status: Home / Self Care | Payer: Medicare Other | Source: Ambulatory Visit | Attending: Orthopedic Surgery | Admitting: Orthopedic Surgery

## 2011-12-10 DIAGNOSIS — M6281 Muscle weakness (generalized): Secondary | ICD-10-CM | POA: Diagnosis not present

## 2011-12-10 DIAGNOSIS — IMO0001 Reserved for inherently not codable concepts without codable children: Secondary | ICD-10-CM | POA: Diagnosis not present

## 2011-12-10 DIAGNOSIS — M542 Cervicalgia: Secondary | ICD-10-CM | POA: Diagnosis not present

## 2011-12-10 NOTE — Progress Notes (Addendum)
Physical Therapy Treatment Patient Details  Name: Ryan [name] MRN: [id number] Date of Birth: 11-Nov-1960  Today's Date: 12/10/2011 Time: 4098-1191 PT Time Calculation (min): 40 min  Visit#: 4  of 18   Re-eval: 12/31/11  Charge: therex 28 min Manual 12  Authorization: Medicare  Authorization Time Period: G Code Current: CL, goal: CJ  Authorization Visit#: 4  of 10    Subjective: Symptoms/Limitations Symptoms: Pt stated pain free today, is compliant with HEP and does not have any questions with any exercise. Pain Assessment Currently in Pain?: No/denies  Objective:   Exercise/Treatments  Neck Exercises Neck Retraction: 15 reps Shoulder Extension: 15 reps;Theraband Theraband Level (Shoulder Extension): Level 4 (Blue) Row: 15 reps;Theraband Theraband Level (Row): Level 4 (Blue) Scapular Retraction: Standing;Theraband;15 reps;Prone;10 reps Theraband Level (Scapular Retraction): Level 4 (Blue) W Back: 15 reps;Weight W Back Weights (lbs): 2 X to V: 15 reps Additional Neck Exercises Wall Pushups/Modified Pushups: 10x 3" with chin tuck UBE (Upper Arm Bike): 4'@1 .5     Physical Therapy Assessment and Plan PT Assessment and Plan Clinical Impression Statement: Therex completed with Becky Sax, PTA with manual by Racheal Patches, PTA.  Advanced therex to prone, pt able to complete with cueing for technique and form.  Able to decrease cervical spasms with manual though tightness still noted. PT Plan: Continue with current POC.    Goals    Problem List Patient Active Problem List  Diagnoses  . HYPERLIPIDEMIA  . TOBACCO ABUSE  . ATRIAL FIBRILLATION  . ASTHMA  . COPD  . GERD with stricture  . ASCVD (arteriosclerotic cardiovascular disease)  . Glucose intolerance (pre-diabetes)  . 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 Cavitary lesion of lung  . SOB (shortness of breath)  . Nausea vomiting and diarrhea  . Dehydration  . Cough  . Decompensated COPD with  exacerbation (chronic obstructive pulmonary disease)  . Abdominal pain  . Acute respiratory failure with hypoxia  . Pulmonary cavitary lesion  . Spondylosis, cervical  . Shoulder pain    PT - End of Session Activity Tolerance: Patient tolerated treatment well General Behavior During Session: [facility] for tasks performed Cognition: [facility] for tasks performed  GP No functional reporting required  Juel Burrow, PTA Seth Bake, PTA 12/10/2011, 5:27 PM

## 2011-12-12 ENCOUNTER — Ambulatory Visit (HOSPITAL_COMMUNITY)
Admission: RE | Admit: 2011-12-12 | Discharge: 2011-12-12 | Disposition: A | Discharge status: Home / Self Care | Payer: Medicare Other | Source: Ambulatory Visit | Attending: Physical Therapy | Admitting: Physical Therapy

## 2011-12-12 DIAGNOSIS — M6281 Muscle weakness (generalized): Secondary | ICD-10-CM | POA: Diagnosis not present

## 2011-12-12 DIAGNOSIS — IMO0001 Reserved for inherently not codable concepts without codable children: Secondary | ICD-10-CM | POA: Diagnosis not present

## 2011-12-12 DIAGNOSIS — M542 Cervicalgia: Secondary | ICD-10-CM | POA: Diagnosis not present
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
## 2011-12-14 IMAGING — CR DG CHEST 2V
2 series · 2 of 2 positions shown · non-contrast
Comparison: 03/31/2010 and 01/14/2010

CLINICAL DATA: Left-sided chest pain.  Shortness of breath.  Back
pain.  Shortness of breath.

CHEST - 2 VIEW

[view not recorded (1 of 2)]
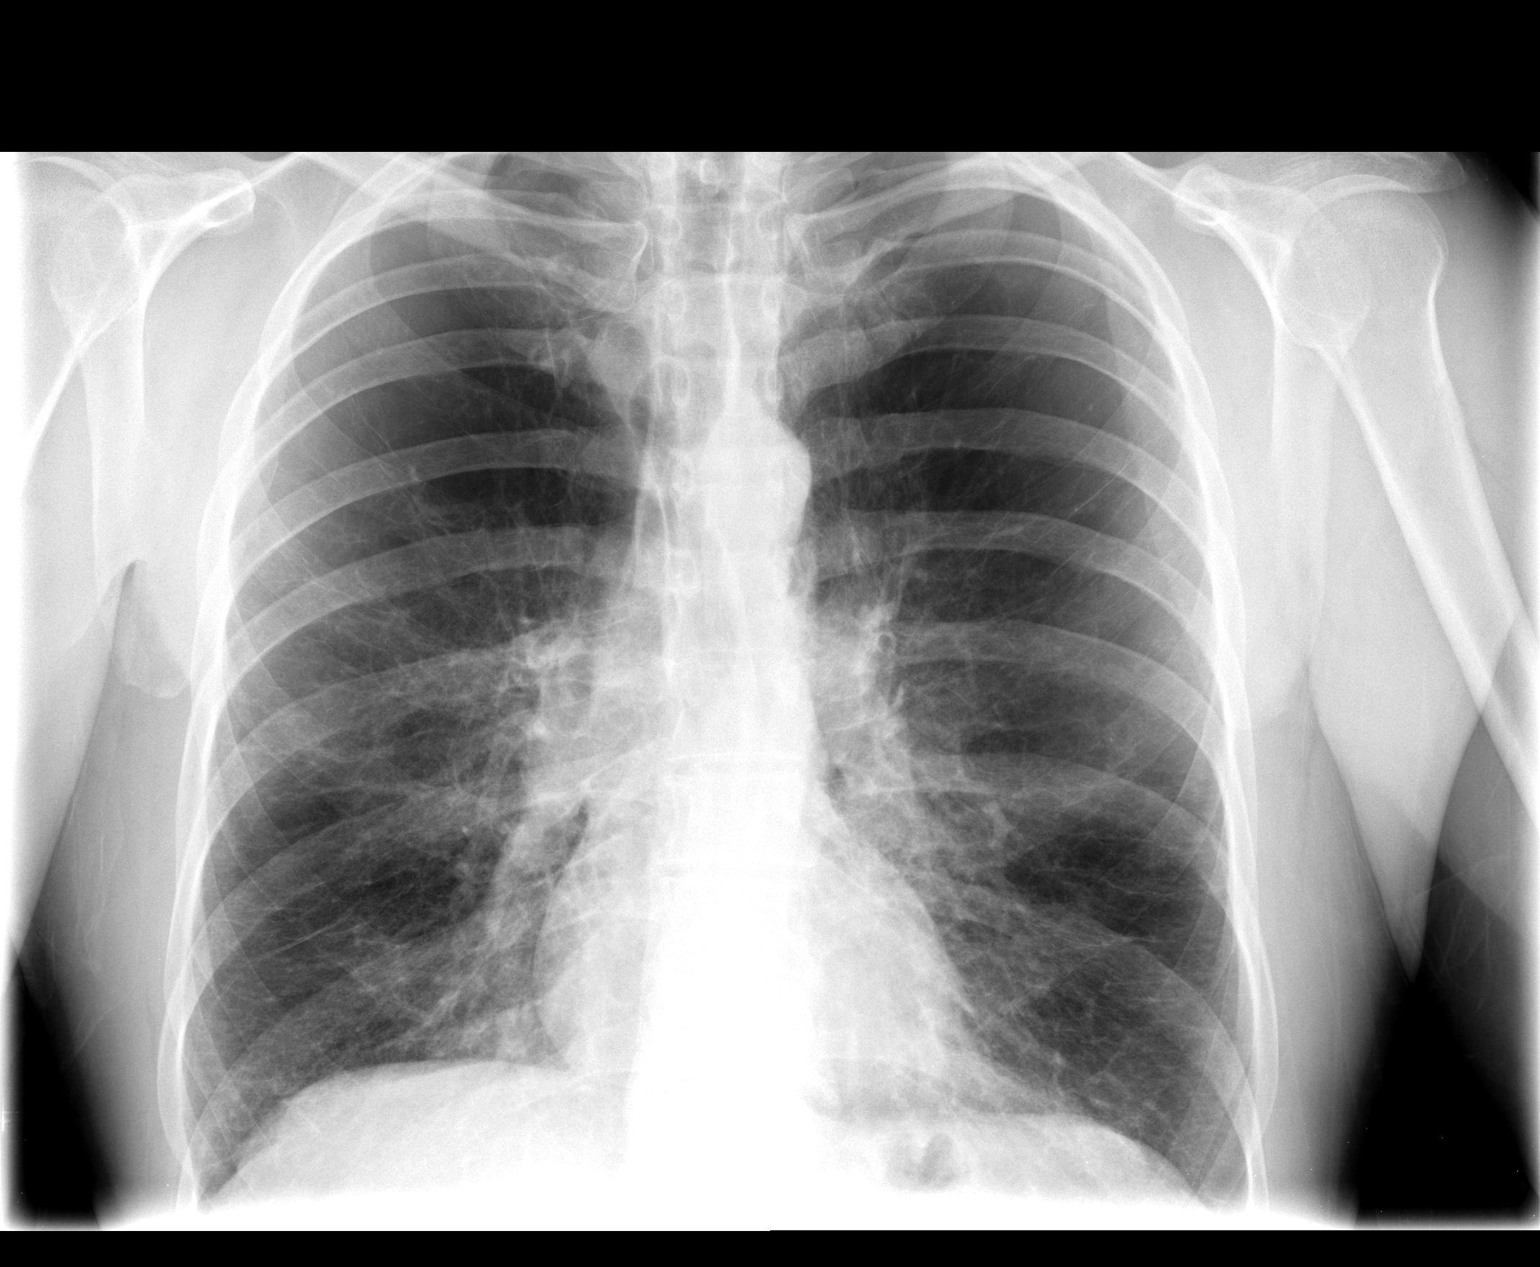

[view not recorded (2 of 2)]
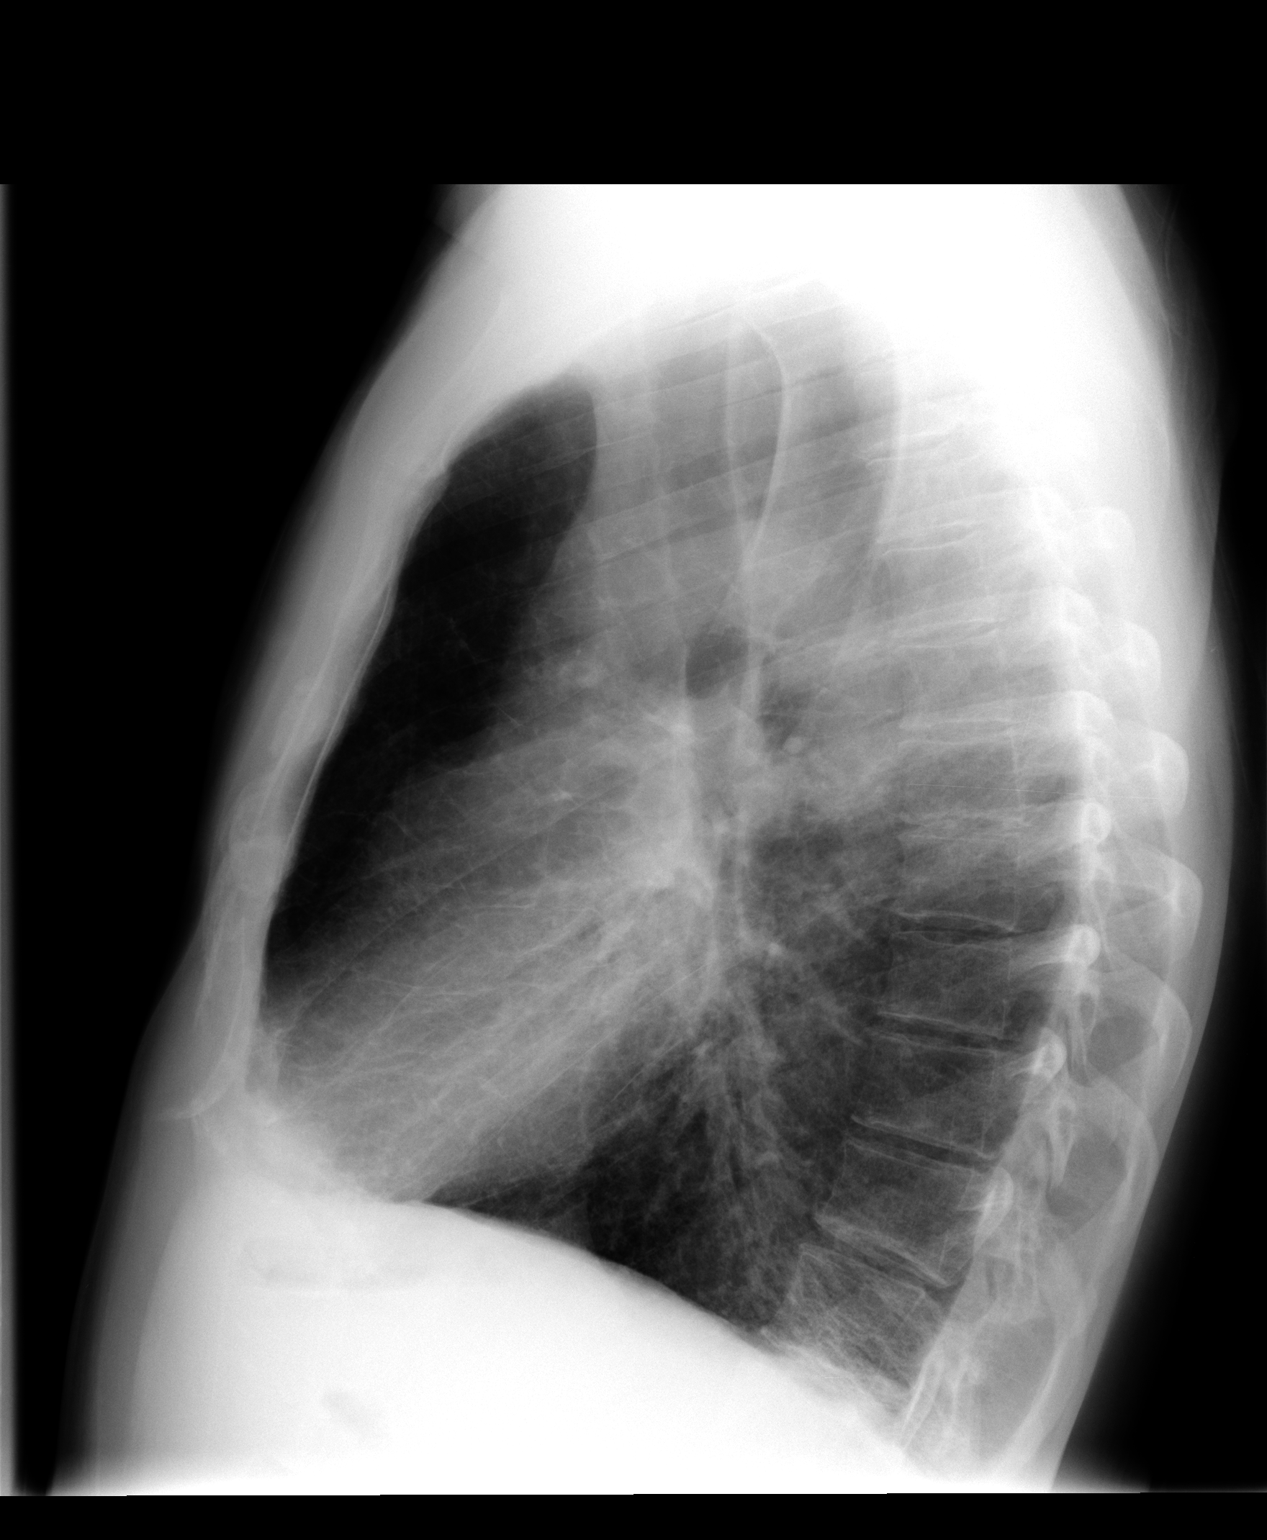

[2 of 2 positions shown; findings below may reference images not displayed]

FINDINGS: The patient has developed accentuation of the
interstitial markings at the bases suggesting mild interstitial
edema with Kerley B lines superimposed on severe emphysematous
disease.  The heart size is normal.  Main pulmonary arteries are
prominent consistent with pulmonary arterial hypertension.  No
discrete effusions.
IMPRESSION: Accentuated interstitial markings at the bases which could
represent mild edema superimposed on severe chronic lung disease.

## 2011-12-15 ENCOUNTER — Ambulatory Visit (HOSPITAL_COMMUNITY)
Admission: RE | Admit: 2011-12-15 | Discharge: 2011-12-15 | Disposition: A | Discharge status: Home / Self Care | Payer: Medicare Other | Source: Ambulatory Visit | Attending: Family Medicine | Admitting: Family Medicine

## 2011-12-15 DIAGNOSIS — M542 Cervicalgia: Secondary | ICD-10-CM | POA: Diagnosis not present

## 2011-12-15 DIAGNOSIS — IMO0001 Reserved for inherently not codable concepts without codable children: Secondary | ICD-10-CM | POA: Insufficient documentation

## 2011-12-15 DIAGNOSIS — M6281 Muscle weakness (generalized): Secondary | ICD-10-CM | POA: Insufficient documentation

## 2011-12-15 NOTE — Progress Notes (Signed)
Physical Therapy Treatment Patient Details  Name: DALANTE MINUS MRN: [id number] Date of Birth: 1960/08/11  Today's Date: 12/15/2011 Time: 9147-8295 PT Time Calculation (min): 38 min Visit#: 6  of 18   Re-eval: 12/26/11 Charges: Therex x 22' Manual x 12'  Authorization: Medicare  Authorization Time Period: G code needed at visit 10; G code goal is CJ   Authorization Visit#: 5  of 10    Subjective: Symptoms/Limitations Symptoms: Pt states that his pain is improveing. He is taking last pain medicine. Pain Assessment Currently in Pain?: No/denies (After pain pill)  Exercise/Treatments Neck Exercises Shoulder Extension: 15 reps;Theraband Theraband Level (Shoulder Extension): Level 4 (Blue) Row: 15 reps;Theraband Theraband Level (Row): Level 4 (Blue) Scapular Retraction: Standing;Theraband;15 reps;Prone;10 reps Theraband Level (Scapular Retraction): Level 4 (Blue) W Back: Prone;10 reps W Back Weights (lbs): 2 Upper Extremity Lift: Limitations Uppder Extremity Lift Limitations: prone rows with 2# x 10; shoulder ext 2# x 10, chin tuck x 10  Manual Therapy Manual Therapy: Massage Massage: STM with MFR techniques to B UT and rhomboids   Physical Therapy Assessment and Plan PT Assessment and Plan Clinical Impression Statement: Pt completes therex well with improved form. Pt does require multimodal cueing to avoid UT contraction with scapular therex. Multiple spasms noted this session throughout UT and rhomboids. Pt reports decreased tightness and 0/10 pain at end of session. PT Plan: Continue to progress per PT POC. Begin prone SAR and horizontal abduction next session.     Problem List Patient Active Problem List  Diagnoses  . HYPERLIPIDEMIA  . TOBACCO ABUSE  . ATRIAL FIBRILLATION  . ASTHMA  . COPD  . GERD with stricture  . ASCVD (arteriosclerotic cardiovascular disease)  . Glucose intolerance (pre-diabetes)  . 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 Cavitary lesion of  lung  . SOB (shortness of breath)  . Nausea vomiting and diarrhea  . Dehydration  . Cough  . Decompensated COPD with exacerbation (chronic obstructive pulmonary disease)  . Abdominal pain  . Acute respiratory failure with hypoxia  . Pulmonary cavitary lesion  . Spondylosis, cervical  . Shoulder pain    PT - End of Session Activity Tolerance: Patient tolerated treatment well General Behavior During Session: [facility] for tasks performed Cognition: [facility] for tasks performed  Seth Bake, PTA 12/15/2011, 3:26 PM

## 2011-12-16 ENCOUNTER — Encounter (HOSPITAL_COMMUNITY): Payer: Medicare Other | Attending: Oncology | Admitting: Oncology

## 2011-12-16 ENCOUNTER — Encounter (HOSPITAL_COMMUNITY): Payer: Self-pay | Admitting: Oncology

## 2011-12-16 VITALS — BP 117/78 | HR 114 | Temp 98.2°F | Wt 139.1 lb

## 2011-12-16 DIAGNOSIS — J984 Other disorders of lung: Secondary | ICD-10-CM | POA: Diagnosis not present

## 2011-12-16 DIAGNOSIS — F17200 Nicotine dependence, unspecified, uncomplicated: Secondary | ICD-10-CM

## 2011-12-16 NOTE — Patient Instructions (Signed)
[facility]  Discharge Instructions  RECOMMENDATIONS MADE BY THE CONSULTANT AND ANY TEST RESULTS WILL BE SENT TO YOUR REFERRING DOCTOR.   Please try to quit smoking. Continue to see Dr.Burney as scheduled. Return to see Dr.Neijstrom in 6 months.   I acknowledge that I have been informed and understand all the instructions given to me and received a copy. I do not have any more questions at this time, but understand that I may call the Specialty Clinic at [facility] at [phone] during business hours should I have any further questions or need assistance in obtaining follow-up care.    __________________________________________  _____________  __________ Signature of Patient or Authorized Representative            Date                   Time    __________________________________________ Nurse's Signature

## 2011-12-16 NOTE — Progress Notes (Signed)
Ryan Baseman, MD, MD [address] [place] [address]  1. Cavitary lesion of lung     CURRENT THERAPY: Observation  INTERVAL HISTORY: Ryan [name] 51 y.o. male returns for  regular  visit for followup of  lung mass.  S/P biopsy of lung mass on 06/11/2011 by Dr. Edwyna [name] which did not reveal a malignancy.  The patient is doing well.  He was recently seen by Dr. Edwyna [name] approximately 1 month ago.  It is reported that his lung mass has decreased in size. He is to follow-up with Dr. Dorris [name] (Dr. Edwyna [name] retired) in 3 months from his May 1 appointment with a CT scan.  I have not ordered a CT scan and will defer this to Dr. Dorris [name].  He is S/P a biopsy which did not reveal a malignancy.  Ryan [name] is undergoing physical therapy for his shoulder which was causing him discomfort secondary to DDD under the care of Dr. Romeo [name] (Ortho).  He reports that his pain is much improved with physical therapy.   The patient had lab work performed by his PCP,Dr. Modesto [name], so we will not repeat any lab work today since that was performed 1 month ago.  Unfortunately, the patient returned to his smoking habits since "I do not have cancer."  I advised him that this was a poor choice.  He is looking for a "fake" cigarette he has at home (I surmise it is an electronic cigarette) so he can try to quite.  He was able to quite smoking when there was a concern for malignancy but reverted back to this poor habit after the biopsy did not reveal bronchogenic carcinoma.  So I provided him some education regarding smoking cessation.    He is to follow-up with Dr. Dorris [name] as ordered and we will see him in follow-up in 6 months.    Past Medical History  Diagnosis Date  . Emphysema   . COPD (chronic obstructive pulmonary disease)   . Hypercholesteremia   . Pneumonia   . Polycythemia   . Coronary artery disease   . Cavitary lesion of lung 05/07/2011  . Borderline diabetes   .  Seizures     last seizure 2 yrs ago  . GERD (gastroesophageal reflux disease)   . Diabetes mellitus   . 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     is allergic to influenza vaccine live.  Ryan [name] does not currently have medications on file.  Past Surgical History  Procedure Date  . Angioplasty   . Colonoscopy w/ endoscopic [place]   . Vasectomy   . Throat biopsy   . Lung biopsy   . Vasectomy 1987  . Cardiac surgery     Denies any headaches, dizziness, double vision, fevers, chills, night sweats, nausea, vomiting, diarrhea, constipation, chest pain, heart palpitations, shortness of breath, blood in stool, black tarry stool, urinary pain, urinary burning, urinary frequency, hematuria.   PHYSICAL EXAMINATION  ECOG PERFORMANCE STATUS: 1 - Symptomatic but completely ambulatory  Filed Vitals:   12/16/11 1502  BP: 117/78  Pulse: 114  Temp: 98.2 F (36.8 C)    GENERAL:alert, no distress, well nourished, well developed, comfortable, cooperative, smiling and chronically ill face SKIN: skin color, texture, turgor are normal, no rashes or significant lesions HEAD: Normocephalic, No masses, lesions, tenderness or abnormalities EYES: normal, Conjunctiva are pink and non-injected EARS: External ears normal OROPHARYNX:lips, buccal mucosa, and tongue normal and mucous membranes are moist  NECK: supple, no adenopathy, trachea  midline LYMPH:  no palpable lymphadenopathy BREAST:not examined LUNGS: clear to auscultation and percussion, decreased breath sounds HEART: regular rate & rhythm, no murmurs, no gallops, S1 normal and S2 normal ABDOMEN:abdomen soft, non-tender and normal bowel sounds BACK: Back symmetric, no curvature. EXTREMITIES:less then 2 second capillary refill, no joint deformities, effusion, or inflammation, no edema, no skin discoloration, no cyanosis, positive findings:  clubbing  NEURO: alert & oriented x 3 with fluent speech, no focal motor/sensory deficits, gait normal   PATHOLOGY: 06/10/2012  Diagnosis Lung, biopsy, Left upper lobe - BENIGN LUNG PARENCHYMA, SEE COMMENT. - NO ATYPIA OR MALIGNANCY PRESENT. Microscopic Comment There is mixed lymphohistiocytic inflammation present. No granulomata are present. Please refer to the corresponding cytology specimens. (CR:mw 06-12-11) Ryan RUND DO Pathologist, Electronic Signature (Case signed 06/12/2011)    ASSESSMENT:  1. Mass in lung, decreased in size.  Not malignanct per pathology on 06/11/2011. 2. Continued tobacco abuse.  3. Lung disease, secondary to tobacco abuse   PLAN:  1. Encouraged the patient to follow-up with Dr. Dorris [name] in 3 months as ordered. He will have a CT scan by Dr. Dorris [name] per Dr. Scheryl Darter note on 11/12/2011. 2. Return in 6 months for follow-up.  If Lung mass continues to decrease in size, and it not malignant at this time, will release the patient from the clinic at his next follow-up appointment and recommend follow-up with PCP.  Smoking cessation education provided.    All questions were answered. The patient knows to call the clinic with any problems, questions or concerns. We can certainly see the patient much sooner if necessary.  Tierra Divelbiss

## 2011-12-17 ENCOUNTER — Ambulatory Visit (HOSPITAL_COMMUNITY)
Admission: RE | Admit: 2011-12-17 | Discharge: 2011-12-17 | Disposition: A | Discharge status: Home / Self Care | Payer: Medicare Other | Source: Ambulatory Visit | Attending: Family Medicine | Admitting: Family Medicine

## 2011-12-17 NOTE — Progress Notes (Signed)
Physical Therapy Treatment Patient Details  Name: JAYKWON MORONES MRN: [id number] Date of Birth: [birth year]  Today's Date: 12/17/2011 Time: 0932-1018 PT Time Calculation (min): 46 min Visit#: 7  of 18   Re-eval: 12/26/11 Charges:  therex 32, massage 10'    Authorization: Medicare  Authorization Time Period: G code needed at visit 10; G code goal is CJ   Authorization Visit#: 7  of 10    Subjective: Symptoms/Limitations Symptoms: Pt. reports his pain increased approx. 4 hours after last session and had radiculopathy into L hand that had returned.  Currently without pain but took pain meds this morning. Pain Assessment Currently in Pain?: No/denies   Exercise/Treatments  12/17/11 1022  Cervical Exercises  Shoulder Extension 20 reps  Theraband Level (Shoulder Extension) Level 4 (Blue)  Row Other reps (comment) (20 reps)  Theraband Level (Row) Level 4 (Blue)  Scapular Retraction Other (comment) (20 reps)  Theraband Level (Scapular Retraction) Level 4 (Blue)  W Back (Prone, 10 reps)  W Back Weights (lbs) 2  X to V 15 reps;Weight (Prone 15 reps)  X to V Weights (lbs) 2#  Upper Extremity Lift Limitations  Uppder Extremity Lift Limitations prone SAR 10 reps, horizontal abd 10 reps, rows with 2# x 10; shoulder ext 2# x 10, chin tuck x 10  Additional Neck Exercises  Wall Pushups/Modified Pushups 10x 3" with chin tuck  UBE (Upper Arm Bike) 4'@1 .5    Manual Therapy Manual Therapy: Massage Massage: STM with MFR techniques to B UT and rhomboids   Physical Therapy Assessment and Plan PT Assessment and Plan Clinical Impression Statement: Added prone SAR and horiz abduction without diff.  VC's/tactile cues for form.  Very tight B UT with STM with multiple spasms.  Pt. reported decreased soreness at end of session. PT Plan: Continue to progress scapular stabilization strength and decrease pain.    Problem List Patient Active Problem List  Diagnoses  . HYPERLIPIDEMIA  . TOBACCO  ABUSE  . ATRIAL FIBRILLATION  . ASTHMA  . COPD  . GERD with stricture  . ASCVD (arteriosclerotic cardiovascular disease)  . Glucose intolerance (pre-diabetes)  . 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 Cavitary lesion of lung  . SOB (shortness of breath)  . Nausea vomiting and diarrhea  . Dehydration  . Cough  . Decompensated COPD with exacerbation (chronic obstructive pulmonary disease)  . Abdominal pain  . Acute respiratory failure with hypoxia  . Pulmonary cavitary lesion  . Spondylosis, cervical  . Shoulder pain    GP No functional reporting required  Lurena Nida, PTA/CLT 12/17/2011, 10:21 AM

## 2011-12-24 ENCOUNTER — Ambulatory Visit (HOSPITAL_COMMUNITY)
Admission: RE | Admit: 2011-12-24 | Discharge: 2011-12-24 | Disposition: A | Discharge status: Home / Self Care | Payer: Medicare Other | Source: Ambulatory Visit | Attending: Family Medicine | Admitting: Family Medicine

## 2011-12-24 NOTE — Progress Notes (Signed)
Physical Therapy Treatment Patient Details  Name: Ryan [name] MRN: [id number] Date of Birth: Nov 14, 1960  Today's Date: 12/24/2011 Time: 4098-1191 PT Time Calculation (min): 39 min  Visit#: 8  of 18   Re-eval: 12/26/11 Charges: Therex x 25' Manual x 10'  Authorization: Medicare  Authorization Time Period: G code needed at visit 10; G code goal is CJ   Authorization Visit#: 8  of 10    Subjective: Symptoms/Limitations Symptoms: Pt reported he has no neck or back pain this morning, pt reported the ramp lowering motorcycle fell and scratched his L LE, pain scale 10/10. Pain Assessment Currently in Pain?: Yes Pain Score: 10-Worst pain ever Pain Location: Leg Pain Orientation: Left   Exercise/Treatments Machines for Strengthening UBE (Upper Arm Bike): 6' @ 2.0 Theraband Exercises Scapula Retraction: 20 reps;Blue Shoulder Extension: 20 reps;Blue Rows: 20 reps;Blue Standing Exercises Wall Push Ups: 15 reps;Limitations Wall Push Ups Limitations: with chin tucks 3" holds Other Standing Exercises: elbow in corner, mid/lower trap isometric 10x 5" Seated Exercises X to V: 15 reps;Weight X to V Weights (lbs): 2# Prone Exercises W Back: 15 reps;Weight W Back Weights (lbs): 2 Shoulder Extension: 15 reps;Weights Shoulder Extension Weights (lbs): 2  Physical Therapy Assessment and Plan PT Assessment and Plan Clinical Impression Statement: Pt completes therex with improved form this session and minimal need for cueing. Tightness and spasms present in B UT but have decreased. Pt reports decreased tightness at end of session. PT Plan: Continue to progress per PT POC.     Problem List Patient Active Problem List  Diagnosis  . HYPERLIPIDEMIA  . TOBACCO ABUSE  . ATRIAL FIBRILLATION  . ASTHMA  . COPD  . GERD with stricture  . ASCVD (arteriosclerotic cardiovascular disease)  . Glucose intolerance (pre-diabetes)  . 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 Cavitary lesion of  lung  . SOB (shortness of breath)  . Nausea vomiting and diarrhea  . Dehydration  . Cough  . Decompensated COPD with exacerbation (chronic obstructive pulmonary disease)  . Abdominal pain  . Acute respiratory failure with hypoxia  . Pulmonary cavitary lesion  . Spondylosis, cervical  . Shoulder pain    PT - End of Session Activity Tolerance: Patient tolerated treatment well General Behavior During Session: [facility] for tasks performed Cognition: [facility] for tasks performed    Seth Bake, PTA 12/24/2011, 11:06 AM

## 2011-12-26 ENCOUNTER — Telehealth (HOSPITAL_COMMUNITY): Payer: Self-pay | Admitting: *Deleted

## 2011-12-26 ENCOUNTER — Ambulatory Visit (HOSPITAL_COMMUNITY): Payer: Medicare Other | Admitting: *Deleted

## 2011-12-29 ENCOUNTER — Ambulatory Visit (HOSPITAL_COMMUNITY)
Admission: RE | Admit: 2011-12-29 | Discharge: 2011-12-29 | Disposition: A | Discharge status: Home / Self Care | Payer: Medicare Other | Source: Ambulatory Visit | Attending: Family Medicine | Admitting: Family Medicine

## 2011-12-29 NOTE — Evaluation (Signed)
Physical Therapy Re-evaluation  Patient Details  Name: Ryan [name] MRN: [id number] Date of Birth: 09/16/1960  Today's Date: 12/29/2011 Time: 7829-5621 PT Time Calculation (min): 34 min  Visit#: 9  of 18   Charges: MMT x 1 ROMM x 1 Self care x 10'    Past Medical History:  Past Medical History  Diagnosis Date  . Emphysema   . COPD (chronic obstructive pulmonary disease)   . Hypercholesteremia   . Pneumonia   . Polycythemia   . Coronary artery disease   . Cavitary lesion of lung 05/07/2011  . Borderline diabetes   . Seizures     last seizure 2 yrs ago  . GERD (gastroesophageal reflux disease)   . Diabetes mellitus   . DDD (degenerative disc disease)     cervical and thoracic   Past Surgical History:  Past Surgical History  Procedure Date  . Angioplasty   . Colonoscopy w/ endoscopic [place]   . Vasectomy   . Throat biopsy   . Lung biopsy   . Vasectomy 1987  . Cardiac surgery     Subjective Symptoms/Limitations Symptoms: Pt states he is not having any pain or radicular sx. Pain Assessment Currently in Pain?: No/denies  Sensation/Coordination/Flexibility/Functional Tests Functional Tests Functional Tests: Neck pain disability 0/50 (was 16/50)  Assessment Cervical AROM Cervical Flexion: wnl Cervical Extension: wnl  Cervical - Right Side Bend: wnl  Cervical - Left Side Bend: wnl Cervical - Right Rotation: wnl Cervical - Left Rotation: wnl Cervical Strength Cervical Extension: 5/5 Cervical - Right Side Bend: 5/5 Cervical - Left Side Bend: 5/5 Palpation Palpation: No significant mm spasms  Exercise/Treatments Machines for Strengthening UBE (Upper Arm Bike): 6' @ 2.0  Physical Therapy Assessment and Plan PT Assessment and Plan Clinical Impression Statement: Pt states that he has improved 80% since beginning therapy. Pt's strength and ROM are WNL. Pt states that he is no longer limited by pain. HEP given. PT Plan: Recommend D/C to HEP.    Goals Home  Exercise Program Pt will Perform Home Exercise Program: Independently PT Short Term Goals Time to Complete Short Term Goals: 2 weeks PT Short Term Goal 1: Pt to state that he is getting 4-5 hours of sleep at night PT Short Term Goal 1 - Progress: Not met (Pt is still not sleeping well but it is not from pain) PT Short Term Goal 2: Pain level highest is a 6 PT Short Term Goal 2 - Progress: Met PT Short Term Goal 3: Improve neck pain disability by 10 levels  PT Short Term Goal 3 - Progress: Met PT Long Term Goals Time to Complete Long Term Goals:  (6 weeks) PT Long Term Goal 1: I in advance HEP PT Long Term Goal 1 - Progress: Met PT Long Term Goal 2: Pt to state he is getting 6 hours of sleep a night PT Long Term Goal 2 - Progress: Not met Long Term Goal 3: Pain level no higher than a 3 Long Term Goal 3 Progress: Progressing toward goal (Pt states that his pain goes above 3/10 around 2x/wk) Long Term Goal 4: Pt to be able to verbalize the importance of posture in neck care Long Term Goal 4 Progress: Met  Problem List Patient Active Problem List  Diagnosis  . HYPERLIPIDEMIA  . TOBACCO ABUSE  . ATRIAL FIBRILLATION  . ASTHMA  . COPD  . GERD with stricture  . ASCVD (arteriosclerotic cardiovascular disease)  . Glucose intolerance (pre-diabetes)  . Erythrocytosis secondary to lung  disease  . Cavitary lesion of lung  . SOB (shortness of breath)  . Nausea vomiting and diarrhea  . Dehydration  . Cough  . Decompensated COPD with exacerbation (chronic obstructive pulmonary disease)  . Abdominal pain  . Acute respiratory failure with hypoxia  . Pulmonary cavitary lesion  . Spondylosis, cervical  . Shoulder pain    PT - End of Session Activity Tolerance: Patient tolerated treatment well General Behavior During Session: [facility] for tasks performed Cognition: [facility] for tasks performed  GP  Functional Reporting Modifier  Current Status  G8978 - Mobility: Walking & Moving Around CI - At  least 1% but less than 20% impaired, limited or restricted  Discharge Status  G8979 - Mobility: [place] & Moving Around CI - At least 1% but less than 20% impaired, limited or restricted   Antonieta Iba 12/29/2011, 10:26 AM  Physician Documentation Your signature is required to indicate approval of the treatment plan as stated above.  Please sign and either send electronically or make a copy of this report for your files and return this physician signed original.   Please mark one 1.__approve of plan  2. ___approve of plan with the following conditions.   ______________________________                                                          _____________________ Physician Signature                                                                                                             Date

## 2012-01-11 IMAGING — CR DG CHEST 1V PORT
1 series · 1 of 1 positions shown · non-contrast
Comparison: 09/26/2010.

CLINICAL DATA: Chest pain.  Cough and chest congestion.  Smoker.
COPD.

PORTABLE CHEST - 1 VIEW

[view not recorded]
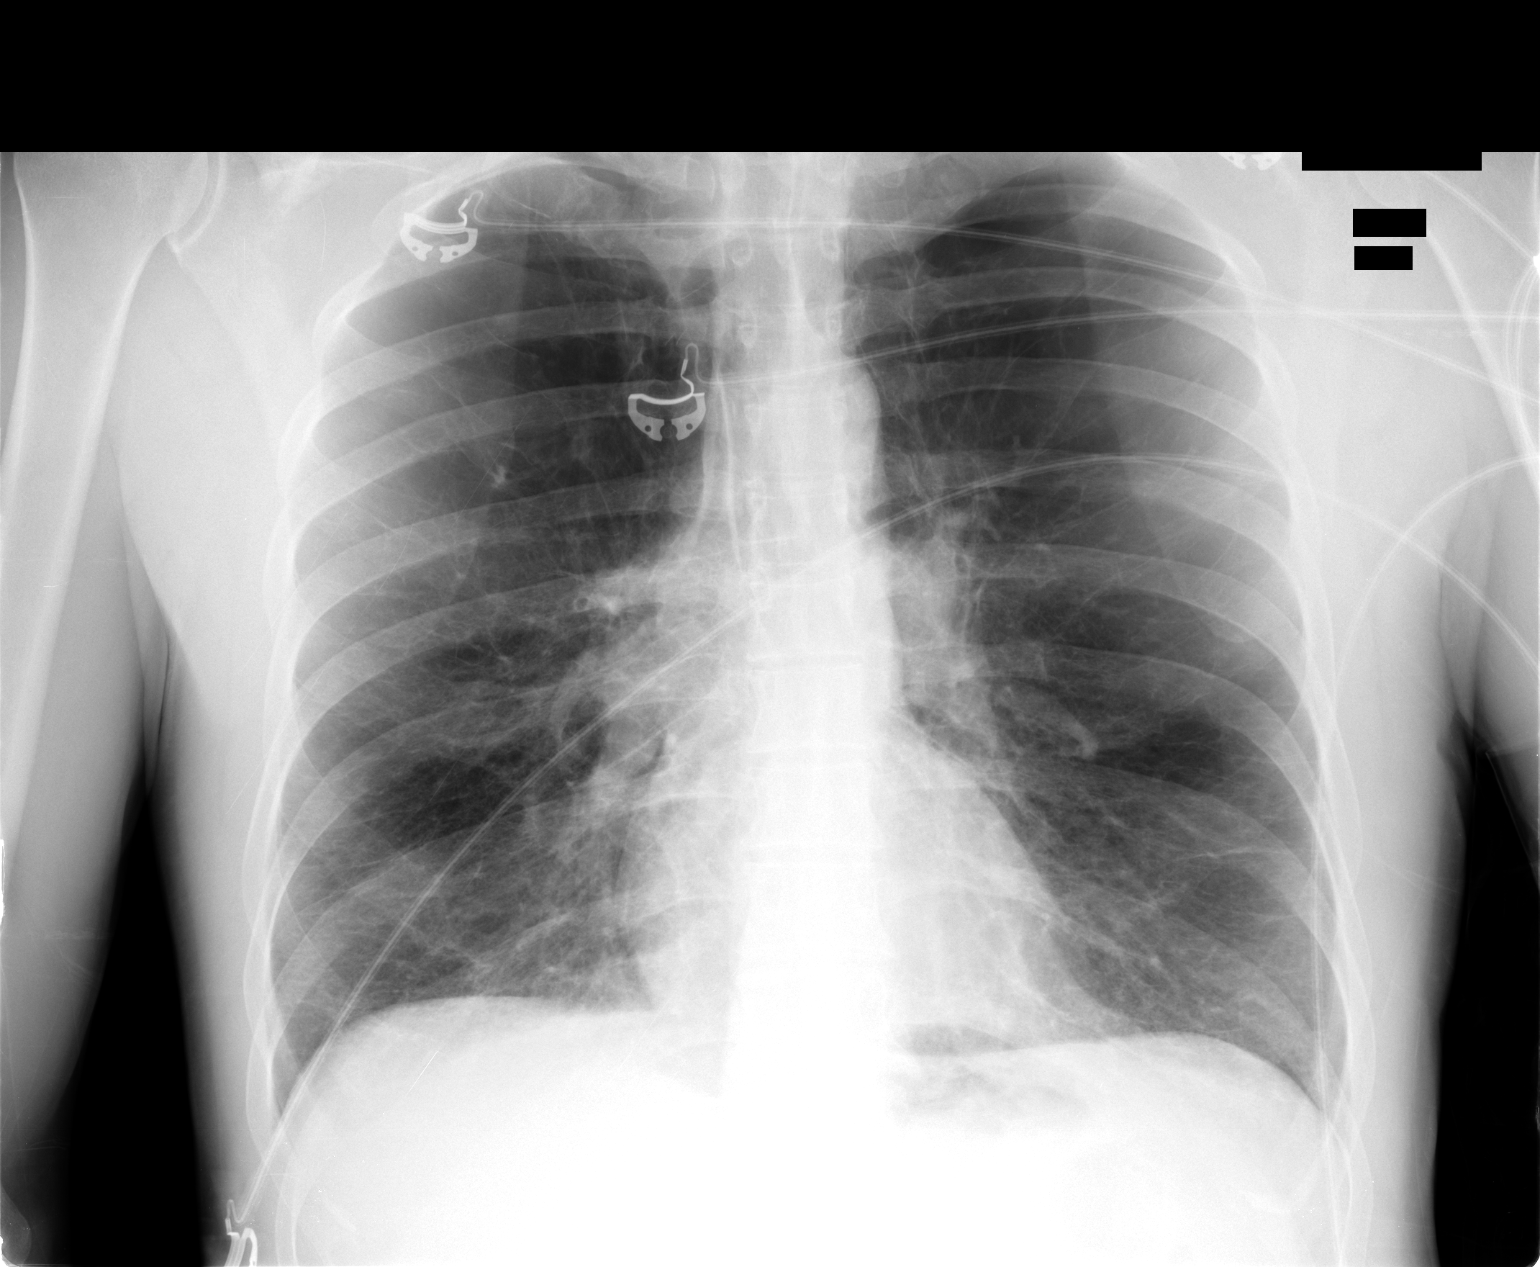

[1 of 1 positions shown; findings below may reference images not displayed]

FINDINGS: The heart remains normal in size.  The lungs remain
hyperexpanded with bullous changes and central peribronchial
thickening.  Minimal scoliosis.
IMPRESSION: Stable changes of COPD and chronic bronchitis.  No acute
abnormality.

## 2012-01-20 ENCOUNTER — Encounter: Payer: Self-pay | Admitting: Orthopedic Surgery

## 2012-01-20 ENCOUNTER — Ambulatory Visit (INDEPENDENT_AMBULATORY_CARE_PROVIDER_SITE_OTHER): Payer: Medicare Other | Admitting: Orthopedic Surgery

## 2012-01-20 VITALS — BP 90/60 | Ht 68.0 in | Wt 139.0 lb

## 2012-01-20 DIAGNOSIS — M542 Cervicalgia: Secondary | ICD-10-CM | POA: Insufficient documentation

## 2012-01-20 DIAGNOSIS — M719 Bursopathy, unspecified: Secondary | ICD-10-CM

## 2012-01-20 DIAGNOSIS — M75102 Unspecified rotator cuff tear or rupture of left shoulder, not specified as traumatic: Secondary | ICD-10-CM | POA: Insufficient documentation

## 2012-01-20 DIAGNOSIS — M25519 Pain in unspecified shoulder: Secondary | ICD-10-CM

## 2012-01-20 DIAGNOSIS — M67919 Unspecified disorder of synovium and tendon, unspecified shoulder: Secondary | ICD-10-CM | POA: Diagnosis not present

## 2012-01-20 DIAGNOSIS — M25512 Pain in left shoulder: Secondary | ICD-10-CM | POA: Insufficient documentation

## 2012-01-20 MED ORDER — HYDROCODONE-ACETAMINOPHEN 5-325 MG PO TABS: 1.0000 | ORAL_TABLET | Freq: Four times a day (QID) | ORAL | Status: DC | PRN

## 2012-01-20 NOTE — Progress Notes (Signed)
Subjective:     Patient ID: Ryan [name], male   DOB: July 30, 1960, 51 y.o.   MRN: [id number]   Chief Complaint  Patient presents with  . Follow-up    7 week follow up Lt shoulder following PT    BP 90/60  Ht 5\' 8"  (1.727 m)  Wt 139 lb (63.05 kg)  BMI 21.13 kg/m2   HPI   Review of Systems No numbness     Objective:   Physical Exam Physical Exam(6) GENERAL: normal development   CDV: pulses are normal   Skin: normal  Psychiatric: awake, alert and oriented  Neuro: normal sensation Reflexes are equal and normal   MSK right shoulder  1 posterior tenderness  2 painful forward elevation 3 no weakness in the cuff  4 + Neer sign          Assessment:     Assessment:  1. RCSYNDROME 2. CERVICAL DJD     Plan:     CONTINUE ORAL HYDROCODONE FOR NECK DJD   INJECT LEFT SHOULDER  Subacromial Shoulder Injection Procedure Note  Pre-operative Diagnosis: left RC Syndrome  Post-operative Diagnosis: same  Indications: pain   Anesthesia: ethyl chloride   Procedure Details   Verbal consent was obtained for the procedure. The shoulder was prepped withalcohol and the skin was anesthetized. A 20 gauge needle was advanced into the subacromial space through posterior approach without difficulty  The space was then injected with 3 ml 1% lidocaine and 1 ml of depomedrol. The injection site was cleansed with isopropyl alcohol and a dressing was applied.  Complications:  None; patient tolerated the procedure well.

## 2012-01-20 NOTE — Patient Instructions (Addendum)
You have received a steroid shot. 15% of patients experience increased pain at the injection site with in the next 24 hours. This is best treated with ice and tylenol extra strength 2 tabs every 8 hours. If you are still having pain please call the office.    

## 2012-01-27 ENCOUNTER — Other Ambulatory Visit: Payer: Self-pay | Admitting: Thoracic Surgery (Cardiothoracic Vascular Surgery)

## 2012-01-27 DIAGNOSIS — D381 Neoplasm of uncertain behavior of trachea, bronchus and lung: Secondary | ICD-10-CM

## 2012-01-28 DIAGNOSIS — Z125 Encounter for screening for malignant neoplasm of prostate: Secondary | ICD-10-CM | POA: Diagnosis not present

## 2012-01-28 DIAGNOSIS — E119 Type 2 diabetes mellitus without complications: Secondary | ICD-10-CM | POA: Diagnosis not present

## 2012-01-28 DIAGNOSIS — E785 Hyperlipidemia, unspecified: Secondary | ICD-10-CM | POA: Diagnosis not present

## 2012-02-17 ENCOUNTER — Ambulatory Visit
Admission: RE | Admit: 2012-02-17 | Discharge: 2012-02-17 | Disposition: A | Discharge status: Home / Self Care | Payer: Medicare Other | Source: Ambulatory Visit | Attending: Thoracic Surgery (Cardiothoracic Vascular Surgery) | Admitting: Thoracic Surgery (Cardiothoracic Vascular Surgery)

## 2012-02-17 ENCOUNTER — Ambulatory Visit (INDEPENDENT_AMBULATORY_CARE_PROVIDER_SITE_OTHER): Payer: Medicare Other | Admitting: Thoracic Surgery (Cardiothoracic Vascular Surgery)

## 2012-02-17 ENCOUNTER — Encounter: Payer: Self-pay | Admitting: Thoracic Surgery (Cardiothoracic Vascular Surgery)

## 2012-02-17 ENCOUNTER — Encounter: Payer: Medicare Other | Admitting: Thoracic Surgery (Cardiothoracic Vascular Surgery)

## 2012-02-17 VITALS — BP 114/74 | HR 78 | Resp 16 | Ht 68.0 in | Wt 140.0 lb

## 2012-02-17 DIAGNOSIS — D381 Neoplasm of uncertain behavior of trachea, bronchus and lung: Secondary | ICD-10-CM

## 2012-02-17 DIAGNOSIS — R911 Solitary pulmonary nodule: Secondary | ICD-10-CM | POA: Diagnosis not present

## 2012-02-17 DIAGNOSIS — R918 Other nonspecific abnormal finding of lung field: Secondary | ICD-10-CM

## 2012-02-17 DIAGNOSIS — J438 Other emphysema: Secondary | ICD-10-CM | POA: Diagnosis not present

## 2012-02-17 NOTE — Progress Notes (Signed)
PCP is [name], MD Referring Provider is Ileana Ladd, MD  Chief Complaint  Patient presents with  . Pulmonary Infiltrate    3 month f/u with CT CHEST W/O    HPI: 51 year old who has been followed by Dr. Edwyna Shell for a cavitary mass in his left upper lobe. Mr. Calvert was found to have a cavitary masses left upper lobe in November of last year. Dr. Edwyna Shell did a bronchoscopy which revealed multi-nucleated giant cells and histiocytes. Cultures grew out MAI. He was treated for that. Dr. Edwyna Shell continue to follow him with CT scans in February and May. Though showed gradual decrease in the size of the left upper lobe cystic mass with a nodular component. He now returns for 3 month followup.  He states that he is currently smoking about a pack a day. He stopped smoking when this issue first came to light. He was using an electronic cigarette. That lasted for about 6 months. He now is back to smoking a pack a day. He says he's been smoking since he was 5 and doubts he will be able to quit. He does get short of breath with exertion and is disabled due to emphysema.     Past Medical History  Diagnosis Date  . Emphysema   . COPD (chronic obstructive pulmonary disease)   . Hypercholesteremia   . Pneumonia   . Polycythemia   . Coronary artery disease   . Cavitary lesion of lung 05/07/2011  . Borderline diabetes   . Seizures     last seizure 2 yrs ago  . GERD (gastroesophageal reflux disease)   . Diabetes mellitus   . DDD (degenerative disc disease)     cervical and thoracic    Past Surgical History  Procedure Date  . Angioplasty   . Colonoscopy w/ endoscopic [place]   . Vasectomy   . Throat biopsy   . Lung biopsy   . Vasectomy 1987  . Cardiac surgery     Family History  Problem Relation Age of Onset  . Hypertension Mother   . Diabetes Mother   . Heart attack Mother   . Heart failure Mother   . Hypertension Sister   . Diabetes Sister   . Heart failure Sister      Social History History  Substance Use Topics  . Smoking status: Current Everyday Smoker -- 1.0 packs/day for 45 years    Types: Cigarettes    Last Attempt to Quit: 06/22/2011  . Smokeless tobacco: Former Neurosurgeon  . Alcohol Use: No    Current Outpatient Prescriptions  Medication Sig Dispense Refill  . albuterol (VENTOLIN HFA) 108 (90 BASE) MCG/ACT inhaler Inhale 2 puffs into the lungs every 4 (four) hours as needed. Shortness of breath      . aspirin EC 81 MG tablet Take 81 mg by mouth daily.        . cetirizine (ZYRTEC) 10 MG tablet Take 10 mg by mouth daily.       [name] CLONAZEPAM PO Take by mouth.      . ezetimibe (ZETIA) 10 MG tablet Take 10 mg by mouth daily.      . Fluticasone-Salmeterol (ADVAIR DISKUS) 250-50 MCG/DOSE AEPB Inhale 1 puff into the lungs every 12 (twelve) hours.       [name] HYDROcodone-acetaminophen (NORCO) 5-325 MG per tablet Take 1 tablet by mouth every 6 (six) hours as needed.  56 tablet  5  . metFORMIN (GLUCOPHAGE) 500 MG tablet Take 250 mg by mouth 2 (  two) times daily with a meal.      . omeprazole (PRILOSEC) 20 MG capsule Take 20 mg by mouth at bedtime.       . simvastatin (ZOCOR) 20 MG tablet Take 20 mg by mouth at bedtime.       [name] tiotropium (SPIRIVA) 18 MCG inhalation capsule Place 18 mcg into inhaler and inhale daily.        Allergies  Allergen Reactions  . Influenza Vaccine Live Swelling    Review of Systems  Constitutional: Negative.   Respiratory: Positive for cough, shortness of breath and wheezing.   Neurological: Positive for seizures.    BP 114/74  Pulse 78  Resp 16  Ht 5\' 8"  (1.727 m)  Wt 140 lb (63.504 kg)  BMI 21.29 kg/m2  SpO2 94% Physical Exam  Constitutional: He is oriented to person, place, and time. He appears well-developed and well-nourished.  HENT:  Head: Normocephalic and atraumatic.  Eyes: EOM are normal. Pupils are equal, round, and reactive to light.  Neck: Neck supple.  Cardiovascular: Normal rate, regular rhythm and  normal heart sounds.   No murmur heard. Pulmonary/Chest: He has no wheezes.       Diminished BS both lungs  Musculoskeletal: He exhibits no edema.       + clubbing and cyanosis of nail beds  Lymphadenopathy:    He has no cervical adenopathy.  Neurological: He is alert and oriented to person, place, and time. No cranial nerve deficit.  Skin: Skin is warm and dry.     Diagnostic Tests: Ct chest 02/17/12 Comparison: CT chest of 11/12/2011  Findings: The cavitary lesion noted within the periphery of the  left upper lobe has further decreased in size. The soft tissue  mass at the base of this cavity has decreased in size, with  measurements at the comparable level now being 2.9 x 1.9 cm  compared to 3.3 x 2.0 cm previously. No new parenchymal lesion is  seen. Diffuse severe changes of centrilobular emphysema are noted.  A small nodule within the lingula appears stable. No pleural  effusion is noted. The airway appears patent.  On soft tissue window images, the thyroid gland is stable. On this  unenhanced study, no mediastinal or hilar adenopathy is noted with  small mediastinal nodes again present. Coronary artery  calcifications again are noted. No abnormality is seen within the  upper abdomen. No bony abnormality is noted.  IMPRESSION:  1. Further decrease in size of the cavitary lesion as well as the  associated soft tissue component of this lesion within the left  upper lobe.  2. Severe centrilobular emphysema.  3. Coronary artery calcifications.   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 He has completed treatment with antibiotics for that. In the cavity and soft tissue component have both have continued to decrease in size. I recommended that we do another scan in 6 months to make sure that this continues to resolve. Beyond that he will be in the age group where annual low-dose CT screening is indicated and we briefly  discussed that as well.  Plan: Return 6 months with a CT of chest.

## 2012-03-03 ENCOUNTER — Ambulatory Visit (INDEPENDENT_AMBULATORY_CARE_PROVIDER_SITE_OTHER): Payer: Medicare Other | Admitting: Adult Health

## 2012-03-03 ENCOUNTER — Encounter: Payer: Self-pay | Admitting: Adult Health

## 2012-03-03 VITALS — BP 120/76 | HR 88 | Ht 68.0 in | Wt 138.1 lb

## 2012-03-03 DIAGNOSIS — I7090 Unspecified atherosclerosis: Secondary | ICD-10-CM | POA: Diagnosis not present

## 2012-03-03 DIAGNOSIS — I2510 Atherosclerotic heart disease of native coronary artery without angina pectoris: Secondary | ICD-10-CM

## 2012-03-03 DIAGNOSIS — I4891 Unspecified atrial fibrillation: Secondary | ICD-10-CM | POA: Diagnosis not present

## 2012-03-03 DIAGNOSIS — E785 Hyperlipidemia, unspecified: Secondary | ICD-10-CM

## 2012-03-03 NOTE — Assessment & Plan Note (Signed)
He is followed by his primary care physician Dr. Modesto Charon for continued labs, We will request recent results.

## 2012-03-03 NOTE — Patient Instructions (Signed)
Your physician recommends that you schedule a follow-up appointment in: 1 year  

## 2012-03-03 NOTE — Assessment & Plan Note (Signed)
This has been quiesced and. Review of his last EKG reveals normal sinus rhythm with occasional arrhythmia. Heart is currently well controlled and regular. He will continue on aspirin daily. We will see him again in one year unless he becomes symptomatic.

## 2012-03-03 NOTE — Progress Notes (Signed)
HPI: Mr. Bourcier is a 51 year old patient of Dr. Dietrich Pates we are seeing for ongoing assessment and treatment of atrial fibrillation, and ongoing tobacco abuse. The patient also has a history of COPD. He was last seen in December of 2012. He has not had any hospitalizations or ER visits since being seen last. 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 He has been placed on pain management medications and has been working with physical therapy. He denies recurrent chest pain, palpitations, or heart racing. He is medically compliant. He has no further episodes of atrial fibrillation. He is not on anticoagulation therapy. He unfortunately continues to smoke.  Allergies  Allergen Reactions  . Influenza Vaccine Live Swelling    Current Outpatient Prescriptions  Medication Sig Dispense Refill  . albuterol (VENTOLIN HFA) 108 (90 BASE) MCG/ACT inhaler Inhale 2 puffs into the lungs every 4 (four) hours as needed. Shortness of breath      . aspirin EC 81 MG tablet Take 81 mg by mouth daily.        [name] atorvastatin (LIPITOR) 20 MG tablet Take 20 mg by mouth at bedtime.       . cetirizine (ZYRTEC) 10 MG tablet Take 10 mg by mouth daily.       [name] CLONAZEPAM PO Take 1 mg by mouth at bedtime.       [name] ezetimibe (ZETIA) 10 MG tablet Take 10 mg by mouth daily.      . Fluticasone-Salmeterol (ADVAIR DISKUS) 250-50 MCG/DOSE AEPB Inhale 1 puff into the lungs every 12 (twelve) hours.       [name] HYDROcodone-acetaminophen (NORCO) 5-325 MG per tablet Take 1 tablet by mouth every 6 (six) hours as needed.  56 tablet  5  . metFORMIN (GLUCOPHAGE) 500 MG tablet Take 250 mg by mouth 2 (two) times daily with a meal.      . tiotropium (SPIRIVA) 18 MCG inhalation capsule Place 18 mcg into inhaler and inhale daily.        Past Medical History  Diagnosis Date  . Emphysema   . COPD (chronic obstructive pulmonary disease)   . Hypercholesteremia   . Pneumonia   . Polycythemia   .  Coronary artery disease   . Cavitary lesion of lung 05/07/2011  . Borderline diabetes   . Seizures     last seizure 2 yrs ago  . GERD (gastroesophageal reflux disease)   . Diabetes mellitus   . DDD (degenerative disc disease)     cervical and thoracic    Past Surgical History  Procedure Date  . Angioplasty   . Colonoscopy w/ endoscopic [place]   . Vasectomy   . Throat biopsy   . Lung biopsy   . Vasectomy 1987  . Cardiac surgery     ZOX:[id number] of systems complete and found to be negative unless listed above PHYSICAL EXAM BP 120/76  Pulse 88  Ht 5\' 8"  (1.727 m)  Wt 138 lb 1.3 oz (62.633 kg)  BMI 21.00 kg/m2  General: Well developed, well nourished, in no acute distress, smelling of cigarettes. Head: Eyes PERRLA, No xanthomas.   Normal cephalic and atramatic  Lungs: Clear bilaterally to auscultation and percussion. Heart: HRRR S1 S2, occasional extra systole without MRG.  Pulses are 2+ & equal.No carotid bruit. No JVD.  No abdominal bruits. No femoral bruits. Abdomen: Bowel sounds are positive, abdomen soft and non-tender without masses or  Hernia's noted. Msk:  Back normal, normal gait. Normal strength and  tone for age. Extremities: No clubbing, cyanosis or edema.  DP +1 Neuro: Alert and oriented X 3. Psych:  Good affect, responds appropriately  EKG: Normal sinus rhythm with occasional sinus arrhythmia rate of 92 beats per minute (March of 2013)  ASSESSMENT AND PLAN

## 2012-03-03 NOTE — Assessment & Plan Note (Signed)
He is without complaints of chest pain, dizziness, or diaphoresis with exertion. He unfortunately continues to smoke. I discussed with him the need to stop smoking as this will increase his risk for MI. He verbalizes understanding.

## 2012-04-28 ENCOUNTER — Telehealth: Payer: Self-pay | Admitting: Orthopedic Surgery

## 2012-04-28 DIAGNOSIS — J029 Acute pharyngitis, unspecified: Secondary | ICD-10-CM | POA: Diagnosis not present

## 2012-04-28 DIAGNOSIS — E119 Type 2 diabetes mellitus without complications: Secondary | ICD-10-CM | POA: Diagnosis not present

## 2012-04-28 DIAGNOSIS — J449 Chronic obstructive pulmonary disease, unspecified: Secondary | ICD-10-CM | POA: Diagnosis not present

## 2012-04-28 DIAGNOSIS — E785 Hyperlipidemia, unspecified: Secondary | ICD-10-CM | POA: Diagnosis not present

## 2012-04-28 NOTE — Telephone Encounter (Signed)
Return the call to the patient to let him know we will refer him to a pain clinic with Dr. Ozzie Hoyle office for evaluation and treatment of his cervical disc disease. We can no longer given any pain medications because we haven't done any surgery and there is no evidence of a tear in his rotator cuff  We/I recommend tylenol or advil over the counter

## 2012-04-28 NOTE — Telephone Encounter (Signed)
Patient called to relay that he is "still in pain" with his shoulder.  He states he has done physical therapy as recommended, and does not have a follow up appointment scheduled.  He said he had gone to his primary care physician Dr. Modesto Charon, and had been told by Dr. Modesto Charon that their are "a pain-free clinic" and do not prescribe pain medication.  Said if he needs to have pain medication prescribed, to follow up with Dr. Romeo Apple.  Please advise regarding scheduling appointment to re-evaluate and/or refill medication.  Patient's ph# is [phone].

## 2012-04-29 NOTE — Telephone Encounter (Signed)
Called patient and relayed.  He appreciates the referral.  Routed note to nurse for sending referral as per Dr. Romeo Apple.

## 2012-05-03 ENCOUNTER — Other Ambulatory Visit: Payer: Self-pay | Admitting: *Deleted

## 2012-05-03 ENCOUNTER — Telehealth: Payer: Self-pay | Admitting: *Deleted

## 2012-05-03 DIAGNOSIS — M5090 Cervical disc disorder, unspecified, unspecified cervical region: Secondary | ICD-10-CM

## 2012-05-03 NOTE — Telephone Encounter (Signed)
Attempted call back to patient to relay per nurse's note, ph# on file not going through.

## 2012-05-03 NOTE — Telephone Encounter (Signed)
REFERRAL HAS BEEN SENT TO DR Eduard Clos

## 2012-05-03 NOTE — Telephone Encounter (Signed)
REFERRAL HAS BEEN SENT TO DR BETHEA 

## 2012-05-04 DIAGNOSIS — E875 Hyperkalemia: Secondary | ICD-10-CM | POA: Diagnosis not present

## 2012-05-10 DIAGNOSIS — M47814 Spondylosis without myelopathy or radiculopathy, thoracic region: Secondary | ICD-10-CM | POA: Diagnosis not present

## 2012-05-10 DIAGNOSIS — M47812 Spondylosis without myelopathy or radiculopathy, cervical region: Secondary | ICD-10-CM | POA: Diagnosis not present

## 2012-05-10 DIAGNOSIS — M542 Cervicalgia: Secondary | ICD-10-CM | POA: Diagnosis not present

## 2012-05-10 DIAGNOSIS — M546 Pain in thoracic spine: Secondary | ICD-10-CM | POA: Diagnosis not present

## 2012-05-12 DIAGNOSIS — M47812 Spondylosis without myelopathy or radiculopathy, cervical region: Secondary | ICD-10-CM | POA: Diagnosis not present

## 2012-05-12 DIAGNOSIS — M542 Cervicalgia: Secondary | ICD-10-CM | POA: Diagnosis not present

## 2012-05-12 DIAGNOSIS — G894 Chronic pain syndrome: Secondary | ICD-10-CM | POA: Diagnosis not present

## 2012-05-12 DIAGNOSIS — R6889 Other general symptoms and signs: Secondary | ICD-10-CM | POA: Diagnosis not present

## 2012-05-20 ENCOUNTER — Telehealth: Payer: Self-pay | Admitting: Orthopedic Surgery

## 2012-05-20 NOTE — Telephone Encounter (Signed)
Patient had been seen for referral appointment with Dr. Ines Bloomer [place],04/25/12; report received, 05/18/12, scanned.

## 2012-05-26 DIAGNOSIS — G894 Chronic pain syndrome: Secondary | ICD-10-CM | POA: Diagnosis not present

## 2012-05-26 DIAGNOSIS — M62838 Other muscle spasm: Secondary | ICD-10-CM | POA: Diagnosis not present

## 2012-05-26 DIAGNOSIS — M47814 Spondylosis without myelopathy or radiculopathy, thoracic region: Secondary | ICD-10-CM | POA: Diagnosis not present

## 2012-05-26 DIAGNOSIS — M546 Pain in thoracic spine: Secondary | ICD-10-CM | POA: Diagnosis not present

## 2012-06-04 IMAGING — CR DG CHEST 1V
1 series · 1 of 1 positions shown · non-contrast
Comparison: Chest 10/24/2010.  CT chest 01/14/2010

CLINICAL DATA: Chest pain.

CHEST - 1 VIEW

[view not recorded]
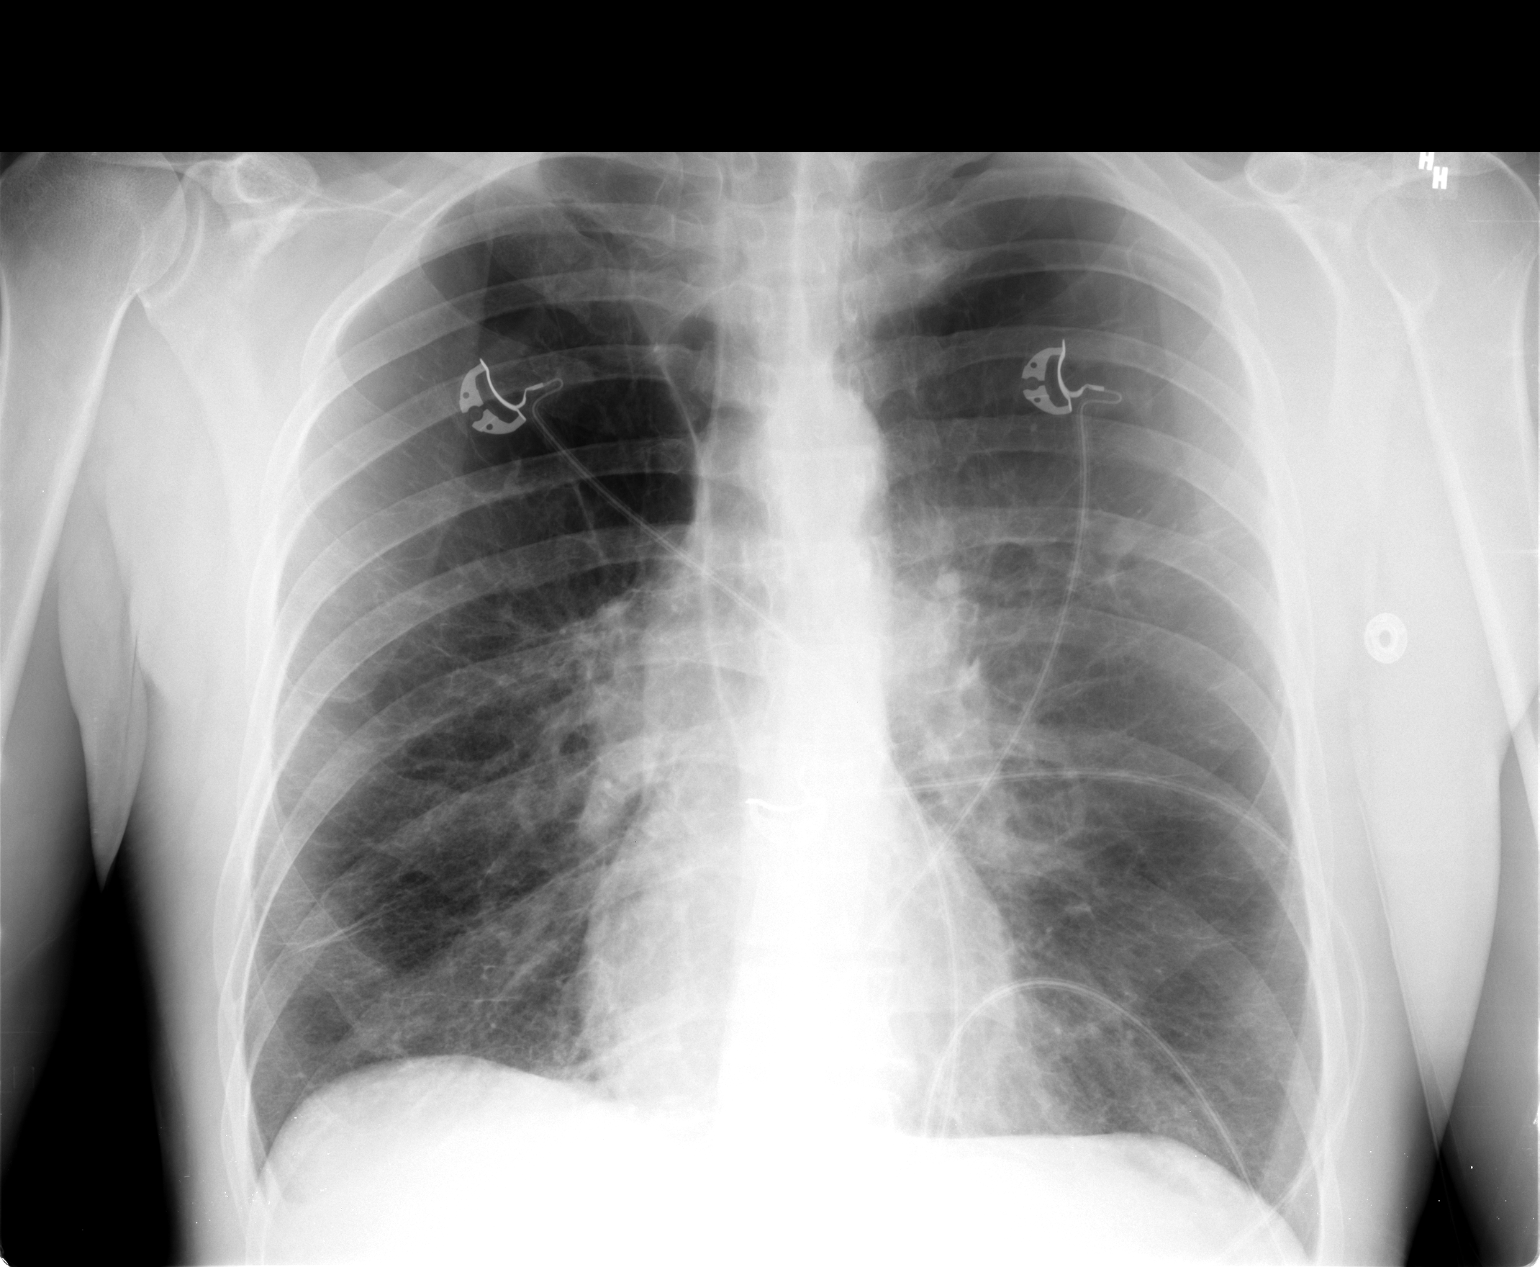

[1 of 1 positions shown; findings below may reference images not displayed]

FINDINGS: Normal heart size and pulmonary vascularity.
Emphysematous changes in the lungs with fibrosis in the lung bases.
Focal nodular scarring in the left mid lung.  This is more
prominent than on the previous studies and a developing pulmonary
nodule should be excluded.
IMPRESSION: Fibrosis and emphysematous changes in the lungs.  Developing
nodular opacity in the left mid lung.  CT recommended to exclude
pulmonary nodule.  No focal airspace consolidation or effusion.

## 2012-06-05 IMAGING — CT CT CHEST W/ CM
2 of 4 series · 15 of 36 positions shown, 18 images · IV contrast (Omnipaque 300)
Comparison: Chest radiograph 03/18/2011, CT [REDACTED]e

CT CHEST

CLINICAL DATA: Abnormal chest radiograph

CT CHEST, ABDOMEN AND PELVIS WITH CONTRAST
TECHNIQUE: Multidetector CT imaging of the chest, abdomen and
pelvis was performed following the standard protocol during bolus
administration of intravenous contrast.
Contrast: 100 ml Omnipaque 300

[Series 2: cap with 5.0 b40f · axial · 0.68mm/px · z∈[-488,-74]mm · 12 of 93 slices shown, 15 images]
[im 5/93  mediastinal]
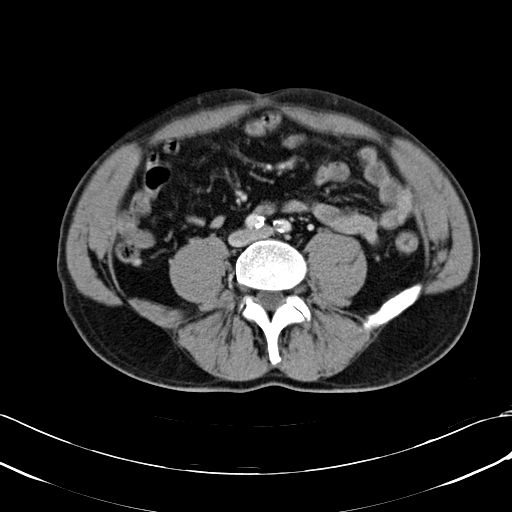
[im 5/93  lung]
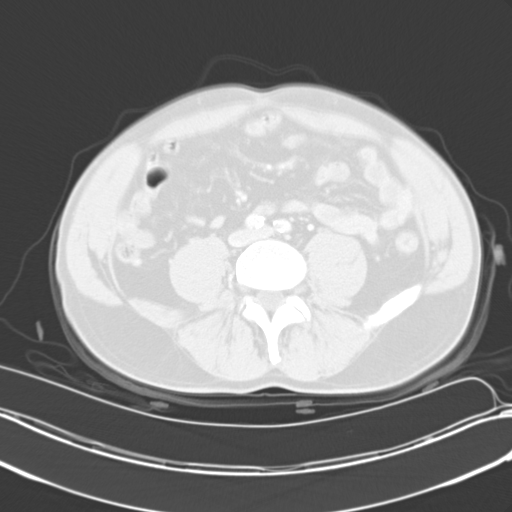
[im 15/93  lung]
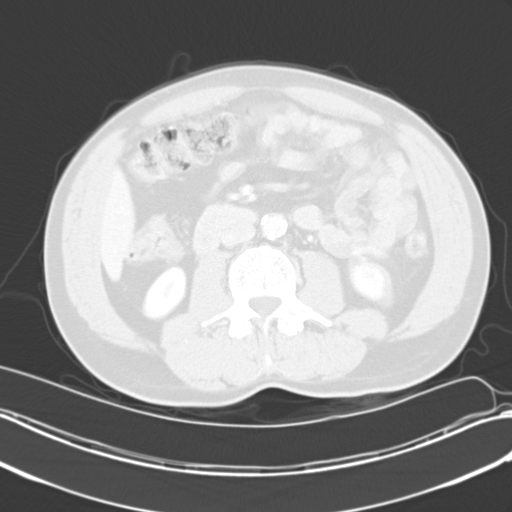
[im 20/93  lung]
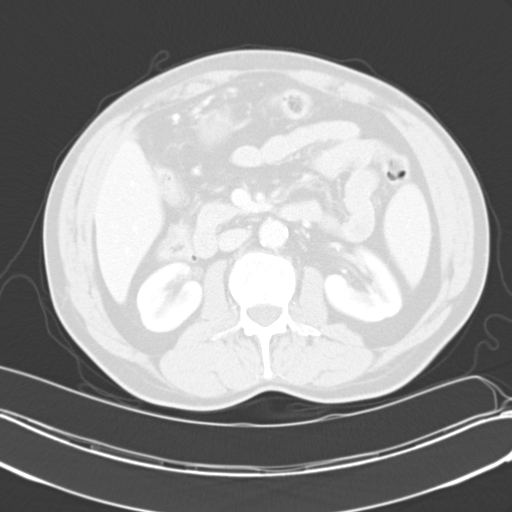
[im 30/93  lung]
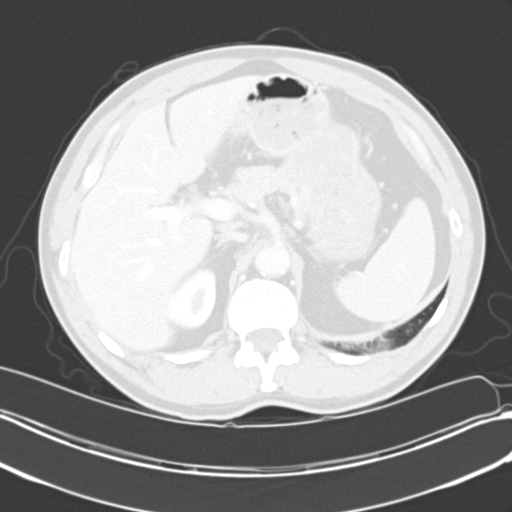
[im 34/93  mediastinal]
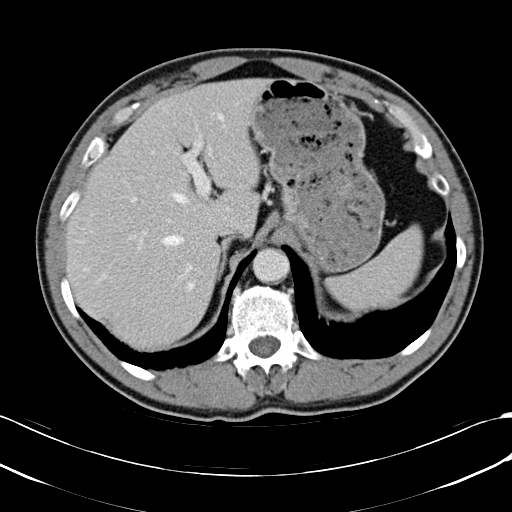
[im 34/93  lung]
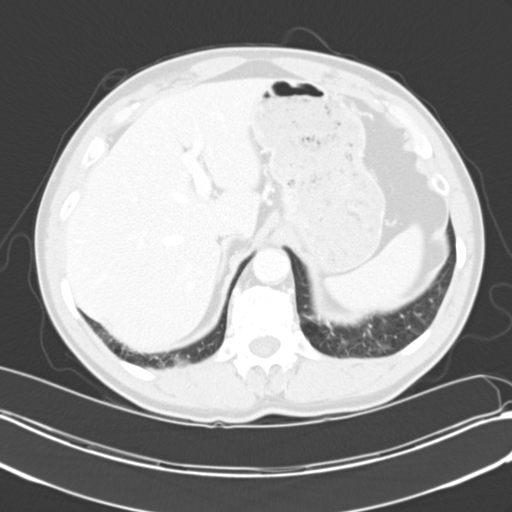
[im 44/93  lung]
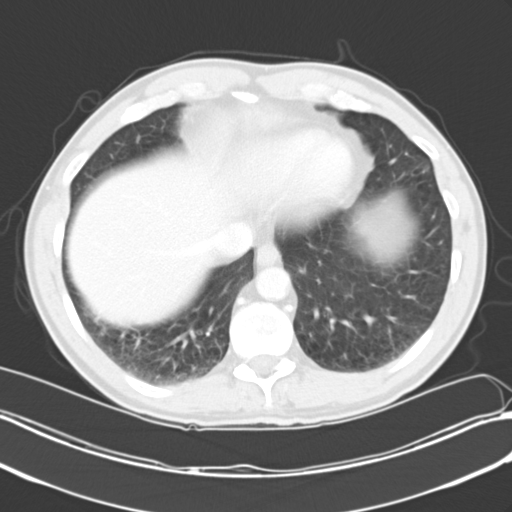
[im 49/93  lung]
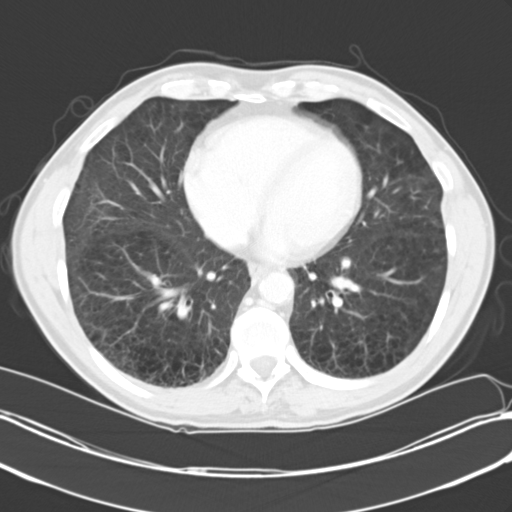
[im 59/93  lung]
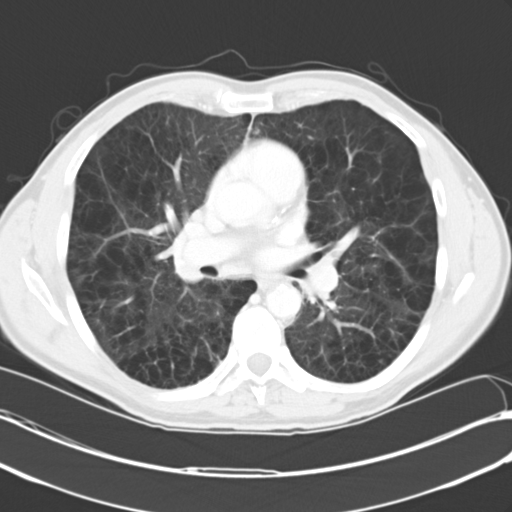
[im 63/93  mediastinal]
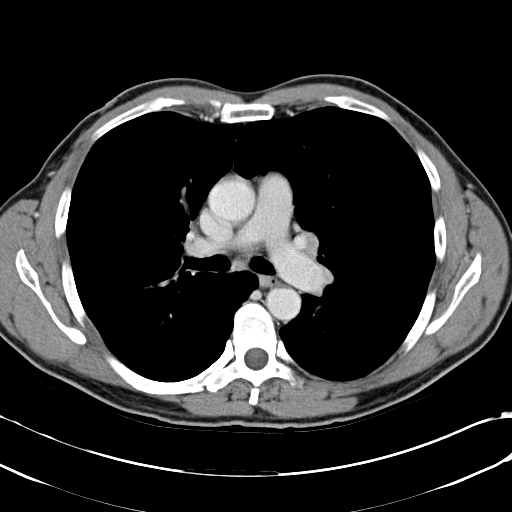
[im 63/93  lung]
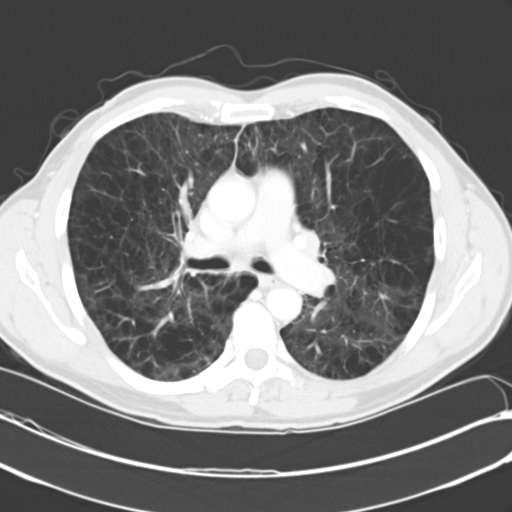
[im 73/93  lung]
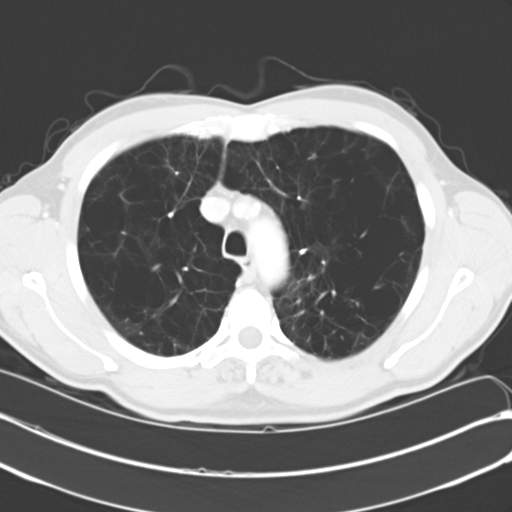
[im 78/93  lung]
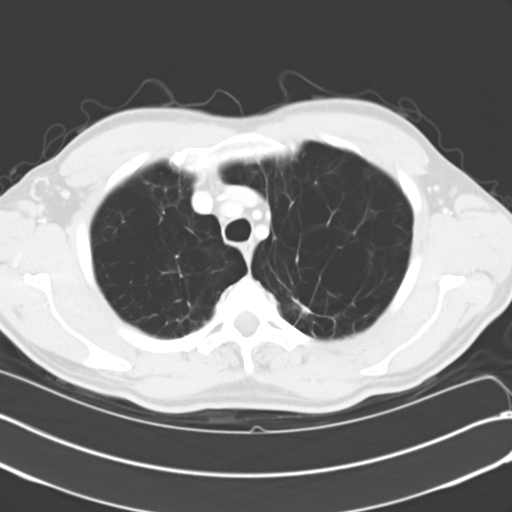
[im 88/93  lung]
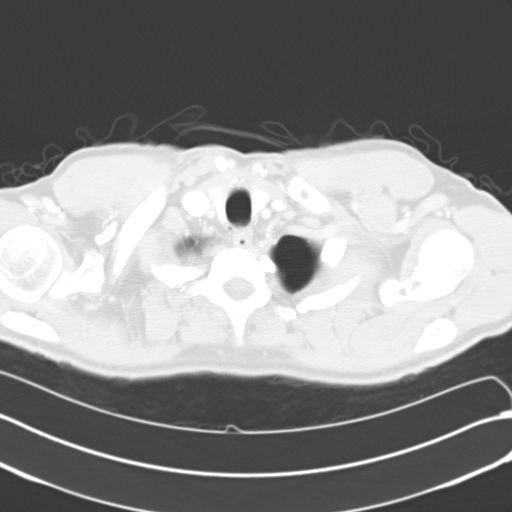

[Series 4: mpr cor post contrast (id) · coronal · 0.72mm/px · 3 of 84 slices shown]
[im 17/84  lung]
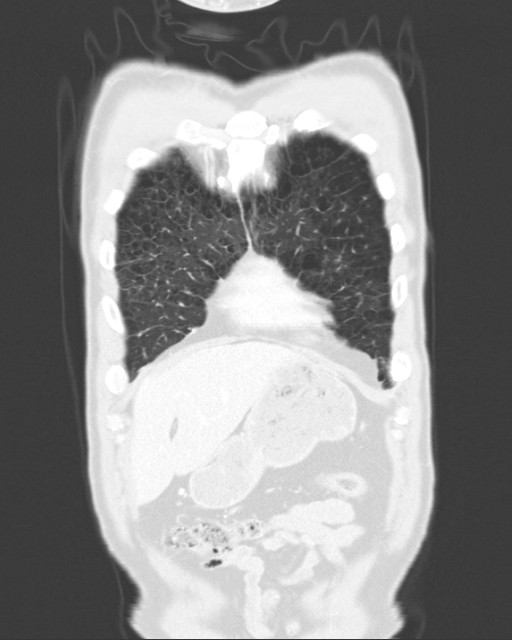
[im 34/84  lung]
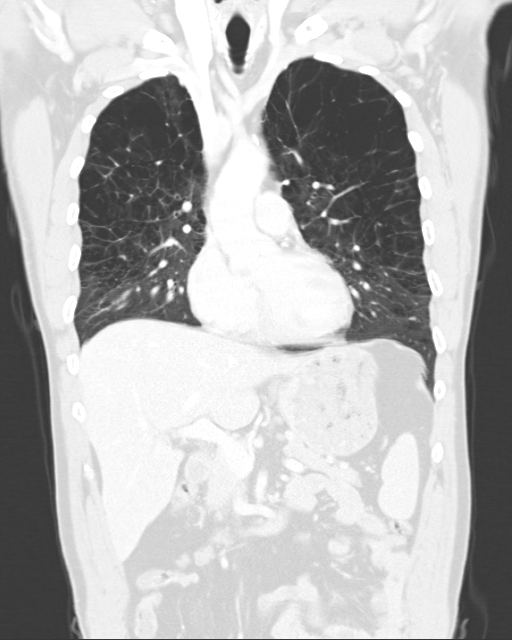
[im 50/84  lung]
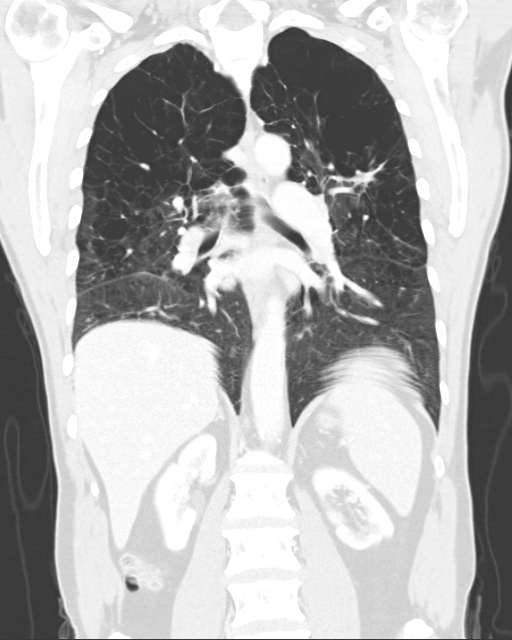

[15 of 36 positions shown; findings below may reference images not displayed]

FINDINGS: Within the left upper lobe there is a new small cavitary
lesion which corresponds to the plain film abnormality.  This was
not present on comparison CT from 01/14/2010.  Lesion is irregular
but measures approximately 19 x 13 mm (image 28, series 3).  On the
coronal projection and sagittal projection there is suggestion of
early cavitation with early wall thickening.

There is severe central lobular emphysema at the lung apices.
There is a focus of nodular scarring at the left posterior upper
lobe measuring 16 mm which is unchanged from prior.

No evidence of mediastinal lymphadenopathy.   8 mm left hilar lymph
node (image 30).  No pericardial fluid.  Coronary artery
calcifications are present.

No axillary supraclavicular lymphadenopathy.
IMPRESSION: 1.  New 2 cm cavitary lesion in the left upper lobe.  Differential
would include infection versus carcinoma.  This lesion may be
difficult to biopsy from a percutaneous approach do to the severe
emphysema and cavitary morphology. Endobronchial approach may be
feasible.  FDG PET scan would likely be hypermetabolic with
infection or carcinoma but would add in staging.

2. Indeterminate left hilar lymph node.

CT ABDOMEN AND PELVIS
FINDINGS: No focal hepatic lesion.  The gallbladder, pancreas,
spleen, adrenal glands, kidneys are normal.

Stomach, small bowel limited view the colon appear normal.  No
retroperitoneal lymphadenopathy.  Aorta normal caliber.

Review of  bone windows demonstrates no aggressive osseous lesions.
IMPRESSION: No evidence of abdominal metastasis.  No lymphadenopathy.

## 2012-06-21 DIAGNOSIS — M47814 Spondylosis without myelopathy or radiculopathy, thoracic region: Secondary | ICD-10-CM | POA: Diagnosis not present

## 2012-06-21 DIAGNOSIS — R6889 Other general symptoms and signs: Secondary | ICD-10-CM | POA: Diagnosis not present

## 2012-06-21 DIAGNOSIS — M47812 Spondylosis without myelopathy or radiculopathy, cervical region: Secondary | ICD-10-CM | POA: Diagnosis not present

## 2012-06-21 DIAGNOSIS — M542 Cervicalgia: Secondary | ICD-10-CM | POA: Diagnosis not present

## 2012-06-21 DIAGNOSIS — G894 Chronic pain syndrome: Secondary | ICD-10-CM | POA: Diagnosis not present

## 2012-06-21 DIAGNOSIS — M546 Pain in thoracic spine: Secondary | ICD-10-CM | POA: Diagnosis not present

## 2012-06-21 DIAGNOSIS — M62838 Other muscle spasm: Secondary | ICD-10-CM | POA: Diagnosis not present

## 2012-06-22 ENCOUNTER — Ambulatory Visit (HOSPITAL_COMMUNITY): Payer: Medicare Other | Admitting: Oncology

## 2012-06-22 DIAGNOSIS — M47812 Spondylosis without myelopathy or radiculopathy, cervical region: Secondary | ICD-10-CM | POA: Diagnosis not present

## 2012-06-22 DIAGNOSIS — R6889 Other general symptoms and signs: Secondary | ICD-10-CM | POA: Diagnosis not present

## 2012-06-22 DIAGNOSIS — M542 Cervicalgia: Secondary | ICD-10-CM | POA: Diagnosis not present

## 2012-06-26 ENCOUNTER — Encounter (HOSPITAL_COMMUNITY): Payer: Self-pay

## 2012-06-26 ENCOUNTER — Emergency Department (HOSPITAL_COMMUNITY): Payer: Medicare Other

## 2012-06-26 ENCOUNTER — Emergency Department (HOSPITAL_COMMUNITY)
Admission: EM | Admit: 2012-06-26 | Discharge: 2012-06-26 | Disposition: A | Discharge status: Home / Self Care | Payer: Medicare Other | Attending: Emergency Medicine | Admitting: Emergency Medicine

## 2012-06-26 DIAGNOSIS — K219 Gastro-esophageal reflux disease without esophagitis: Secondary | ICD-10-CM | POA: Diagnosis not present

## 2012-06-26 DIAGNOSIS — Z7982 Long term (current) use of aspirin: Secondary | ICD-10-CM | POA: Diagnosis not present

## 2012-06-26 DIAGNOSIS — J449 Chronic obstructive pulmonary disease, unspecified: Secondary | ICD-10-CM | POA: Insufficient documentation

## 2012-06-26 DIAGNOSIS — R109 Unspecified abdominal pain: Secondary | ICD-10-CM | POA: Diagnosis not present

## 2012-06-26 DIAGNOSIS — I2510 Atherosclerotic heart disease of native coronary artery without angina pectoris: Secondary | ICD-10-CM | POA: Insufficient documentation

## 2012-06-26 DIAGNOSIS — Z8709 Personal history of other diseases of the respiratory system: Secondary | ICD-10-CM | POA: Insufficient documentation

## 2012-06-26 DIAGNOSIS — Z79899 Other long term (current) drug therapy: Secondary | ICD-10-CM | POA: Insufficient documentation

## 2012-06-26 DIAGNOSIS — F17200 Nicotine dependence, unspecified, uncomplicated: Secondary | ICD-10-CM | POA: Insufficient documentation

## 2012-06-26 DIAGNOSIS — G40909 Epilepsy, unspecified, not intractable, without status epilepticus: Secondary | ICD-10-CM | POA: Insufficient documentation

## 2012-06-26 DIAGNOSIS — R10819 Abdominal tenderness, unspecified site: Secondary | ICD-10-CM | POA: Diagnosis not present

## 2012-06-26 DIAGNOSIS — J4489 Other specified chronic obstructive pulmonary disease: Secondary | ICD-10-CM | POA: Insufficient documentation

## 2012-06-26 DIAGNOSIS — Z8701 Personal history of pneumonia (recurrent): Secondary | ICD-10-CM | POA: Diagnosis not present

## 2012-06-26 DIAGNOSIS — E7800 Pure hypercholesterolemia, unspecified: Secondary | ICD-10-CM | POA: Diagnosis not present

## 2012-06-26 DIAGNOSIS — G8929 Other chronic pain: Secondary | ICD-10-CM | POA: Insufficient documentation

## 2012-06-26 DIAGNOSIS — J438 Other emphysema: Secondary | ICD-10-CM | POA: Diagnosis not present

## 2012-06-26 DIAGNOSIS — R1084 Generalized abdominal pain: Secondary | ICD-10-CM | POA: Insufficient documentation

## 2012-06-26 DIAGNOSIS — Z8739 Personal history of other diseases of the musculoskeletal system and connective tissue: Secondary | ICD-10-CM | POA: Diagnosis not present

## 2012-06-26 DIAGNOSIS — M549 Dorsalgia, unspecified: Secondary | ICD-10-CM | POA: Diagnosis not present

## 2012-06-26 DIAGNOSIS — Z9861 Coronary angioplasty status: Secondary | ICD-10-CM | POA: Diagnosis not present

## 2012-06-26 DIAGNOSIS — E119 Type 2 diabetes mellitus without complications: Secondary | ICD-10-CM | POA: Diagnosis not present

## 2012-06-26 DIAGNOSIS — R072 Precordial pain: Secondary | ICD-10-CM | POA: Insufficient documentation

## 2012-06-26 DIAGNOSIS — J8410 Pulmonary fibrosis, unspecified: Secondary | ICD-10-CM | POA: Diagnosis not present

## 2012-06-26 DIAGNOSIS — R079 Chest pain, unspecified: Secondary | ICD-10-CM

## 2012-06-26 DIAGNOSIS — R599 Enlarged lymph nodes, unspecified: Secondary | ICD-10-CM | POA: Diagnosis not present

## 2012-06-26 HISTORY — DX: Dorsalgia, unspecified: M54.9

## 2012-06-26 HISTORY — DX: Other chronic pain: G89.29

## 2012-06-26 LAB — CBC WITH DIFFERENTIAL/PLATELET
Basophils Absolute: 0 10*3/uL (ref 0.0–0.1)
Basophils Relative: 1 % (ref 0–1)
Eosinophils Relative: 2 % (ref 0–5)
HCT: 46.8 % (ref 39.0–52.0)
MCH: 29 pg (ref 26.0–34.0)
MCHC: 34.6 g/dL (ref 30.0–36.0)
MCV: 83.7 fL (ref 78.0–100.0)
Monocytes Absolute: 0.5 10*3/uL (ref 0.1–1.0)
RDW: 14.3 % (ref 11.5–15.5)

## 2012-06-26 LAB — POCT I-STAT TROPONIN I: Troponin i, poc: 0 ng/mL (ref 0.00–0.08)

## 2012-06-26 LAB — BASIC METABOLIC PANEL
CO2: 26 mEq/L (ref 19–32)
Chloride: 99 mEq/L (ref 96–112)
Creatinine, Ser: 1.02 mg/dL (ref 0.50–1.35)
Potassium: 4.1 mEq/L (ref 3.5–5.1)

## 2012-06-26 LAB — URINALYSIS, ROUTINE W REFLEX MICROSCOPIC
Bilirubin Urine: NEGATIVE
Leukocytes, UA: NEGATIVE
Nitrite: NEGATIVE
Specific Gravity, Urine: <1.005 — ABNORMAL LOW (ref 1.005–1.030)
pH: 7.5 (ref 5.0–8.0)

## 2012-06-26 LAB — LIPASE, BLOOD: Lipase: 22 U/L (ref 11–59)

## 2012-06-26 MED ORDER — ASPIRIN 81 MG PO CHEW
324.0000 mg | CHEWABLE_TABLET | Freq: Once | ORAL | Status: AC
  Administered 2012-06-26: 324 mg via ORAL
  Filled 2012-06-26: qty 4

## 2012-06-26 MED ORDER — OXYCODONE-ACETAMINOPHEN 5-325 MG PO TABS
2.0000 | ORAL_TABLET | Freq: Once | ORAL | Status: AC
  Administered 2012-06-26: 2 via ORAL
  Filled 2012-06-26: qty 2

## 2012-06-26 MED ORDER — HYDROMORPHONE HCL PF 1 MG/ML IJ SOLN
1.0000 mg | Freq: Once | INTRAMUSCULAR | Status: AC
  Administered 2012-06-26: 1 mg via INTRAVENOUS
  Filled 2012-06-26: qty 1

## 2012-06-26 MED ORDER — OXYCODONE-ACETAMINOPHEN 5-325 MG PO TABS: 1.0000 | ORAL_TABLET | ORAL | Status: DC | PRN

## 2012-06-26 MED ORDER — ONDANSETRON HCL 4 MG/2ML IJ SOLN
4.0000 mg | Freq: Once | INTRAMUSCULAR | Status: AC
Start: 2012-06-26 — End: 2012-06-26
  Administered 2012-06-26: 4 mg via INTRAVENOUS
  Filled 2012-06-26: qty 2

## 2012-06-26 MED ORDER — SODIUM CHLORIDE 0.9 % IV BOLUS (SEPSIS)
1000.0000 mL | Freq: Once | INTRAVENOUS | Status: AC
  Administered 2012-06-26: 1000 mL via INTRAVENOUS

## 2012-06-26 MED ORDER — IOHEXOL 300 MG/ML  SOLN
100.0000 mL | Freq: Once | INTRAMUSCULAR | Status: AC | PRN
  Administered 2012-06-26: 100 mL via INTRAVENOUS

## 2012-06-26 NOTE — ED Notes (Signed)
Pt states chest pain at 1030, after eating breakfast. Pt denies SOB, N/V/D, arm and jaw pain. No diaphoresis noted. Pt is alert, oriented x 4. NAD noted.

## 2012-06-26 NOTE — ED Notes (Addendum)
Pt o2 sat dropped to 87% on room air while up urinating, Pt placed on 3l 02 nasal cannula and 02 says 95% a this time, NAD noted, pt states he uses 02 at home PRN

## 2012-06-26 NOTE — ED Provider Notes (Addendum)
History   This chart was scribed for Ryan Quarry, MD by Melba Coon, ED Scribe. The patient was seen in room APA04/APA04 and the patient's care was started at 2:00PM.    CSN: [id number]  Arrival date & time 06/26/12  1330   First MD Initiated Contact with Patient 06/26/12 1352      Chief Complaint  Patient presents with  . Chest Pain    (Consider location/radiation/quality/duration/timing/severity/associated sxs/prior treatment) The history is provided by the patient. No language interpreter was used.   Ryan [name] is a 51 y.o. male who presents to the Emergency Department complaining of constant, moderate to severe, heavy mid sternal chest pain with an onset 2:00-3:00AM this morning. He reports he was getting in his truck when the pain started. He rates the severity of the pain 10/10. Changing body position (especially laying back) and deep inhalation aggravates the chest pain. He has had this pain before with heart palpitations; he was seen at the hospital and admitted into Intensive Care for 4 days.  He reports chronic chills.  Denies HA, cough, fever, neck pain, sore throat, rash, back pain, abdominal pain, nausea, emesis, diarrhea, dysuria, or extremity pain, edema, weakness, numbness, or tingling. He still follows with the cardio surgeon. He reports past stent placement. No other pertinent medical symptoms.  PCP: Dr. Leodis Sias Pulmonary: Dr. Juanetta Gosling Cardio: Dr. Jovita Gamma  Past Medical History  Diagnosis Date  . Emphysema   . COPD (chronic obstructive pulmonary disease)   . Hypercholesteremia   . Pneumonia   . Polycythemia   . Coronary artery disease   . Cavitary lesion of lung 05/07/2011  . Borderline diabetes   . Seizures     last seizure 2 yrs ago  . GERD (gastroesophageal reflux disease)   . Diabetes mellitus   . 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 Chronic back pain     Past Surgical History  Procedure Date  . Angioplasty    . Colonoscopy w/ endoscopic [place]   . Vasectomy   . Throat biopsy   . Lung biopsy   . Vasectomy 1987  . Cardiac surgery     Family History  Problem Relation Age of Onset  . Hypertension Mother   . Diabetes Mother   . Heart attack Mother   . Heart failure Mother   . Hypertension Sister   . Diabetes Sister   . Heart failure Sister     History  Substance Use Topics  . Smoking status: Current Every Day Smoker -- 1.0 packs/day for 45 years    Types: Cigarettes    Last Attempt to Quit: 06/22/2011  . Smokeless tobacco: Former Neurosurgeon  . Alcohol Use: No      Review of Systems  Cardiovascular: Positive for chest pain.   10 Systems reviewed and all are negative for acute change except as noted in the HPI.   Allergies  Influenza vaccine live  Home Medications   Current Outpatient Rx  Name  Route  Sig  Dispense  Refill  . ALBUTEROL SULFATE HFA 108 (90 BASE) MCG/ACT IN AERS   Inhalation   Inhale 2 puffs into the lungs every 4 (four) hours as needed. Shortness of breath         . ASPIRIN EC 81 MG PO TBEC   Oral   Take 81 mg by mouth daily.           . ATORVASTATIN CALCIUM 20 MG PO TABS  Oral   Take 20 mg by mouth at bedtime.          [name] CETIRIZINE HCL 10 MG PO TABS   Oral   Take 10 mg by mouth daily.          [name] CLONAZEPAM PO   Oral   Take 1 mg by mouth at bedtime.          [name] EZETIMIBE 10 MG PO TABS   Oral   Take 10 mg by mouth daily.         [name] FLUTICASONE-SALMETEROL 250-50 MCG/DOSE IN AEPB   Inhalation   Inhale 1 puff into the lungs every 12 (twelve) hours.          [name] HYDROCODONE-ACETAMINOPHEN 5-325 MG PO TABS   Oral   Take 1 tablet by mouth every 6 (six) hours as needed.   56 tablet   5   . METFORMIN HCL 500 MG PO TABS   Oral   Take 250 mg by mouth 2 (two) times daily with a meal.         . TIOTROPIUM BROMIDE MONOHYDRATE 18 MCG IN CAPS   Inhalation   Place 18 mcg into inhaler and inhale daily.           BP 127/94  Pulse 80   Temp 98.3 F (36.8 C) (Oral)  Resp 24  SpO2 94%  Physical Exam  Constitutional: He is oriented to person, place, and time. He appears well-developed and well-nourished.  Non-toxic appearance. He does not appear ill. No distress.  HENT:  Head: Normocephalic and atraumatic.  Right Ear: External ear normal.  Left Ear: External ear normal.  Nose: Nose normal. No mucosal edema or rhinorrhea.  Mouth/Throat: Oropharynx is clear and moist and mucous membranes are normal. No dental abscesses or uvula swelling.  Eyes: Conjunctivae normal and EOM are normal. Pupils are equal, round, and reactive to light.  Neck: Normal range of motion and full passive range of motion without pain. Neck supple.  Cardiovascular: Normal rate, regular rhythm and normal heart sounds.  Exam reveals no gallop and no friction rub.   No murmur heard. Pulmonary/Chest: Effort normal. No respiratory distress. He has no wheezes. He has no rhonchi. He has no rales. He exhibits tenderness (mild; worsened when he lays back). He exhibits no crepitus.       Decreased breath sounds.  Abdominal: Normal appearance and bowel sounds are normal. He exhibits no distension. There is tenderness (mild, diffuse). There is no rebound and no guarding.       Firm abdomen.  Musculoskeletal: Normal range of motion. He exhibits no edema and no tenderness.       Moves all extremities well.   Neurological: He is alert and oriented to person, place, and time. He has normal strength. No cranial nerve deficit.  Skin: Skin is warm, dry and intact. No rash noted. No erythema. No pallor.  Psychiatric: He has a normal mood and affect. His speech is normal and behavior is normal. His mood appears not anxious.    ED Course  Procedures (including critical care time)  DIAGNOSTIC STUDIES: Oxygen Saturation is 94% on room air, adequate by my interpretation.    COORDINATION OF CARE:  2:07PM - labs and radiology studies will be ordered for [employer].      Date: 06/26/2012  Rate: 78  Rhythm: normal sinus rhythm  QRS Axis: normal  Intervals: normal  ST/T Wave abnormalities: normal  Conduction Disutrbances:wandering baseline  Narrative  Interpretation:   Old EKG Reviewed: none available and unchanged  Repeat ekg  Date: 06/26/2012  Rate: 74  Rhythm: normal sinus rhythm  QRS Axis: normal  Intervals: normal  ST/T Wave abnormalities: normal  Conduction Disutrbances: none  Narrative Interpretation: unremarkable      Labs Reviewed  TROPONIN I  CBC WITH DIFFERENTIAL  BASIC METABOLIC PANEL   No results found.   No diagnosis found. Results for orders placed during the hospital encounter of 06/26/12  TROPONIN I      Component Value Range   Troponin I <0.30  <0.30 ng/mL  CBC WITH DIFFERENTIAL      Component Value Range   WBC 6.2  4.0 - 10.5 K/uL   RBC 5.59  4.22 - 5.81 MIL/uL   Hemoglobin 16.2  13.0 - 17.0 g/dL   HCT 16.1  09.6 - 04.5 %   MCV 83.7  78.0 - 100.0 fL   MCH 29.0  26.0 - 34.0 pg   MCHC 34.6  30.0 - 36.0 g/dL   RDW 40.9  81.1 - 91.4 %   Platelets 228  150 - 400 K/uL   Neutrophils Relative 55  43 - 77 %   Neutro Abs 3.4  1.7 - 7.7 K/uL   Lymphocytes Relative 35  12 - 46 %   Lymphs Abs 2.1  0.7 - 4.0 K/uL   Monocytes Relative 9  3 - 12 %   Monocytes Absolute 0.5  0.1 - 1.0 K/uL   Eosinophils Relative 2  0 - 5 %   Eosinophils Absolute 0.1  0.0 - 0.7 K/uL   Basophils Relative 1  0 - 1 %   Basophils Absolute 0.0  0.0 - 0.1 K/uL  BASIC METABOLIC PANEL      Component Value Range   Sodium 134 (*) 135 - 145 mEq/L   Potassium 4.1  3.5 - 5.1 mEq/L   Chloride 99  96 - 112 mEq/L   CO2 26  19 - 32 mEq/L   Glucose, Bld 115 (*) 70 - 99 mg/dL   BUN 15  6 - 23 mg/dL   Creatinine, Ser 7.82  0.50 - 1.35 mg/dL   Calcium 9.2  8.4 - 95.6 mg/dL   GFR calc non Af Amer 82 (*) >90 mL/min   GFR calc Af Amer >90  >90 mL/min  LIPASE, BLOOD      Component Value Range   Lipase 22  11 - 59 U/L  URINALYSIS, ROUTINE W REFLEX  MICROSCOPIC      Component Value Range   Color, Urine YELLOW  YELLOW   APPearance CLEAR  CLEAR   Specific Gravity, Urine <1.005 (*) 1.005 - 1.030   pH 7.5  5.0 - 8.0   Glucose, UA NEGATIVE  NEGATIVE mg/dL   Hgb urine dipstick NEGATIVE  NEGATIVE   Bilirubin Urine NEGATIVE  NEGATIVE   Ketones, ur NEGATIVE  NEGATIVE mg/dL   Protein, ur NEGATIVE  NEGATIVE mg/dL   Urobilinogen, UA 0.2  0.0 - 1.0 mg/dL   Nitrite NEGATIVE  NEGATIVE   Leukocytes, UA NEGATIVE  NEGATIVE   Ct Chest W Contrast  06/26/2012  *RADIOLOGY REPORT*  Clinical Data:  Chest and abdominal pain and tenderness on exam. Left lung mass.  CT CHEST, ABDOMEN AND PELVIS WITH CONTRAST  Technique:  Multidetector CT imaging of the chest, abdomen and pelvis was performed following the standard protocol during bolus administration of intravenous contrast.  Contrast: OMNIPAQUE IOHEXOL 300 MG/ML  SOLN  Comparison:  Chest  CT on 02/17/2012  CT CHEST  Findings:  Severe emphysema again demonstrated. Thick-walled cavitary lesion in the peripheral left upper lobe shows mild decrease in size, now measuring 2.2 x 4.1 cm compared to 2.4 x 4.4 cm previously.  Bibasilar peripheral interstitial fibrosis with honeycombing is unchanged.  No new areas of infiltrates seen and there is no evidence of new or enlarging pulmonary nodules or masses.  Mild bilateral hilar lymphadenopathy shows no significant change. No evidence of mediastinal lymphadenopathy or axillary lymphadenopathy.  No evidence of pleural or pericardial effusion.  IMPRESSION:  1.  Slight decrease in size of thick-walled cavitary lesion in the peripheral left upper lobe. 2.  Severe pulmonary emphysema with bibasilar interstitial fibrosis. 3.  Stable mild bilateral hilar lymphadenopathy. 4.  No new or progressive disease within the thorax.  CT ABDOMEN AND PELVIS  Findings:  The abdominal parenchymal organs are normal in appearance.  Gallbladder is unremarkable.  No evidence of hydronephrosis.  No  soft tissue masses or lymphadenopathy identified.  No evidence of inflammatory process or abnormal fluid collections. Normal appendix is visualized.  No evidence of bowel wall thickening or dilatation.  IMPRESSION: No evidence of metastatic disease or other significant abnormality.   Original Report Authenticated By: Myles Rosenthal, M.D.    Dg Chest Portable 1 View  06/26/2012  *RADIOLOGY REPORT*  Clinical Data: Chest pain, short of breath  PORTABLE CHEST - 1 VIEW  Comparison: Chest radiograph 09/11/2011, CT thorax 02/17/2012  Findings: Normal cardiac silhouette.  There is nodular pleural parenchymal thickening in the lateral left upper lobe which is not significantly changed from comparison CT.  There is bullous change in the upper lobes.  Interstitial lung disease at the lung bases. No pleural fluid.  No pneumothorax.  No focal consolidation.  IMPRESSION:  1.  No clear acute cardiopulmonary findings.  2.  Nodular pleural parenchymal thickening in the left lateral upper lobe is not changed from comparison CT.  3.  Emphysematous change in the upper lobes and interstitial pattern in the lower lobes.   Original Report Authenticated By: Genevive Bi, M.D.      MDM  Patient with chest and abdominal pain with atypical presentation for ischemic chest pain withpositional change and abdomen tender and firm  Pain resolved here with dilaudid.  EKG and first set of trop negative.  Patient pain free now.  Plan repeat ekg and troponin and if negative will d/d home.  Repeat troponin negative and repeat ekg normal.  Patient advised to return if worse at any time and recheck with his pmd on Monday.   Ryan Quarry, MD 06/26/12 Paulo Fruit  Ryan Quarry, MD 06/26/12 [phone]

## 2012-06-26 NOTE — ED Notes (Signed)
Pt removed from O2  Secondary to pt's history. Pt maintains SAO2 in low 90's. Pt reports having O2 at home and uses occasionally. NAD noted at this time. Dr Rosalia Hammers aware

## 2012-06-26 NOTE — ED Notes (Signed)
Pt reports mid sternal cp since 4:30 this am, denies any n/v/d or fever. Denies cough.   Pain does not radiate. No sob.  Hurts worse w. Movement.

## 2012-06-29 DIAGNOSIS — M542 Cervicalgia: Secondary | ICD-10-CM | POA: Diagnosis not present

## 2012-06-29 DIAGNOSIS — M47812 Spondylosis without myelopathy or radiculopathy, cervical region: Secondary | ICD-10-CM | POA: Diagnosis not present

## 2012-06-29 DIAGNOSIS — R6889 Other general symptoms and signs: Secondary | ICD-10-CM | POA: Diagnosis not present

## 2012-06-29 DIAGNOSIS — G894 Chronic pain syndrome: Secondary | ICD-10-CM | POA: Diagnosis not present

## 2012-08-05 IMAGING — CT CT CHEST W/O CM
2 of 3 series · 15 of 36 positions shown, 18 images · non-contrast
Comparison: 03/19/2011 CT

CLINICAL DATA: Left-sided lung nodule, history of abscess treated
with antibiotics.  Chest pain

CT CHEST WITHOUT CONTRAST
TECHNIQUE: Multidetector CT imaging of the chest was performed
following the standard protocol without IV contrast.

[Series 2: chestroutine 5.0 b40f · axial · 0.66mm/px · z∈[-317,-62]mm · 12 of 61 slices shown, 15 images]
[im 5/61  mediastinal]
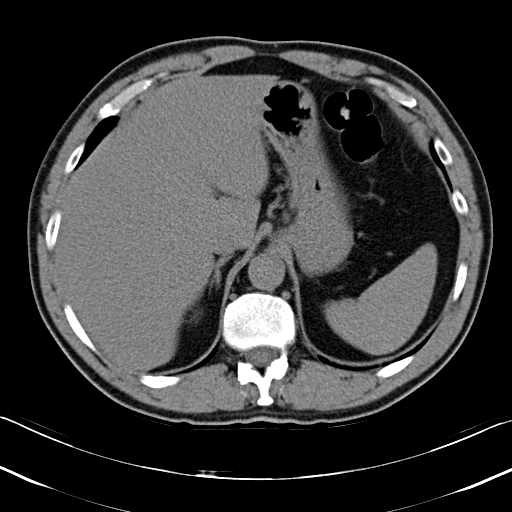
[im 5/61  lung]
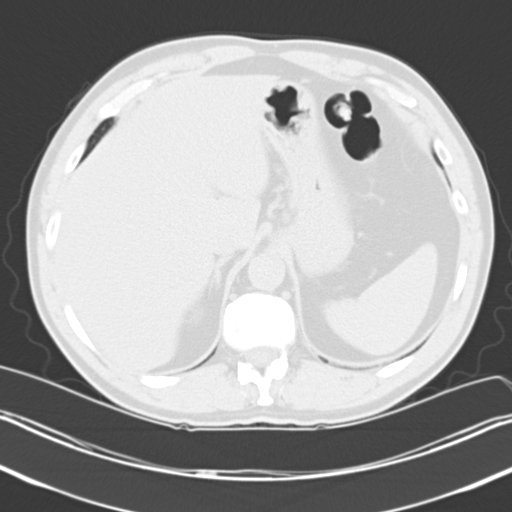
[im 9/61  lung]
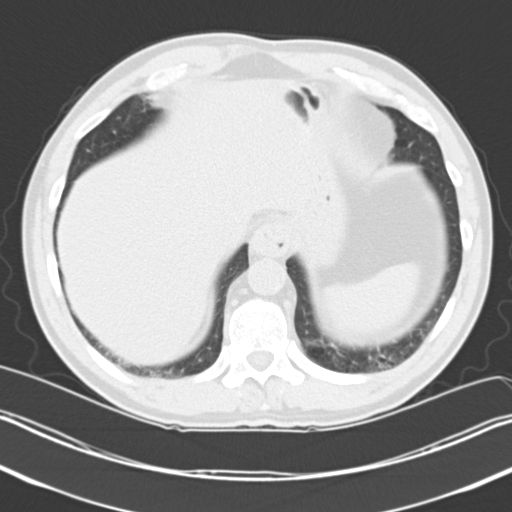
[im 14/61  lung]
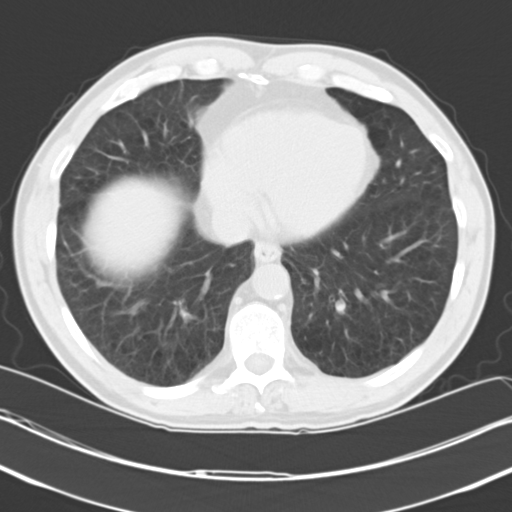
[im 18/61  lung]
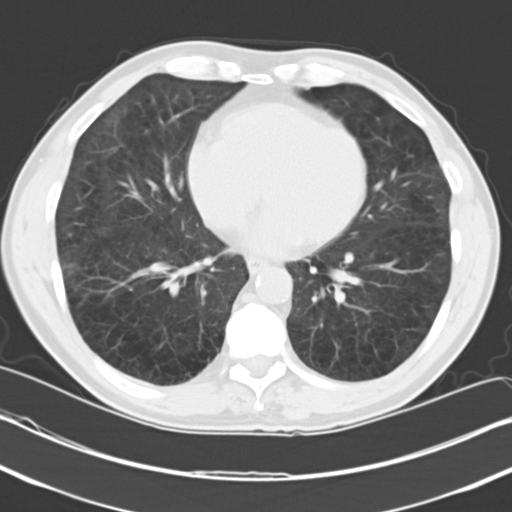
[im 23/61  mediastinal]
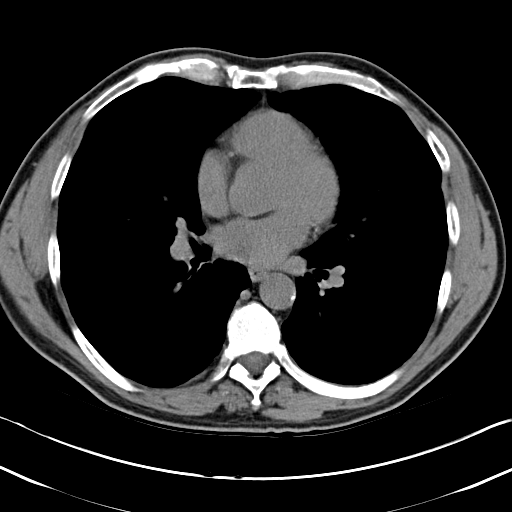
[im 23/61  lung]
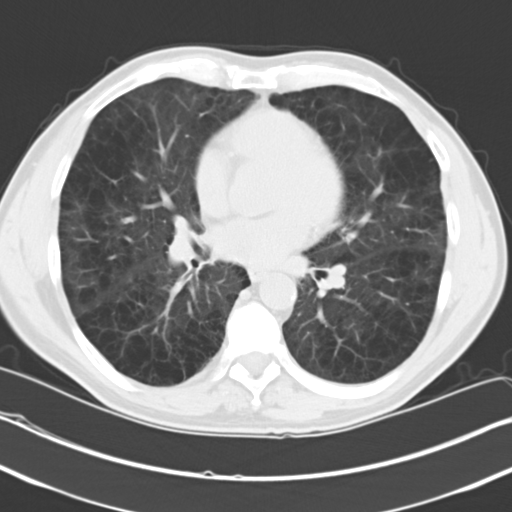
[im 27/61  lung]
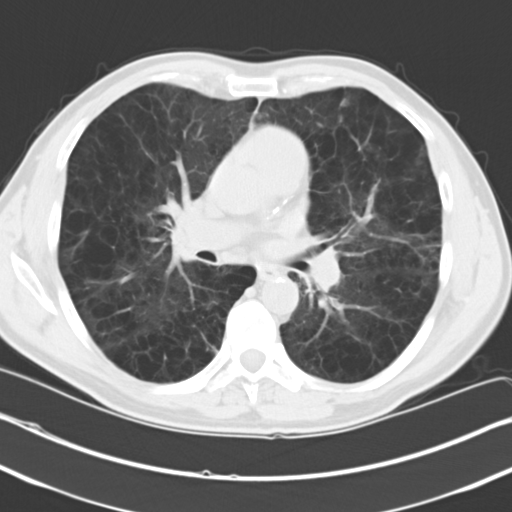
[im 34/61  lung]
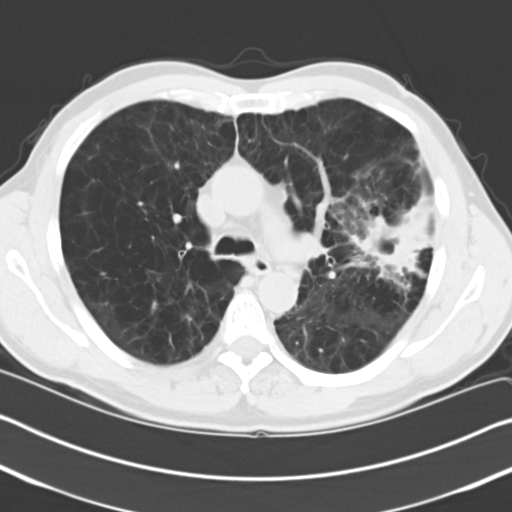
[im 38/61  lung]
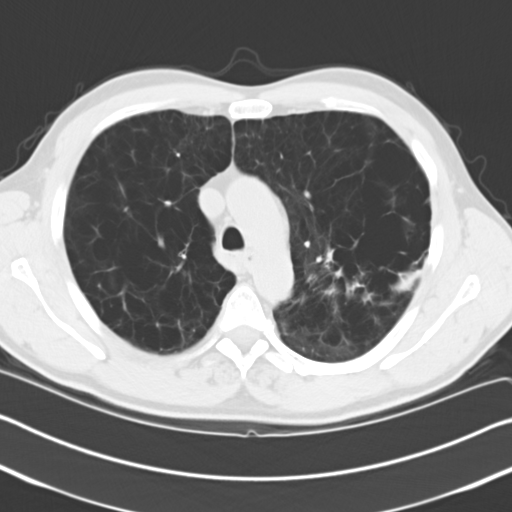
[im 43/61  mediastinal]
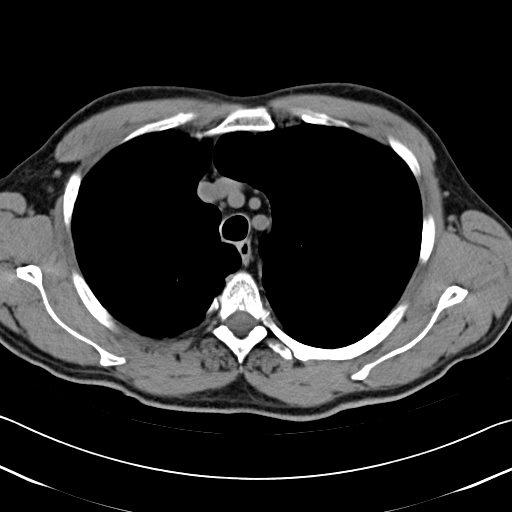
[im 43/61  lung]
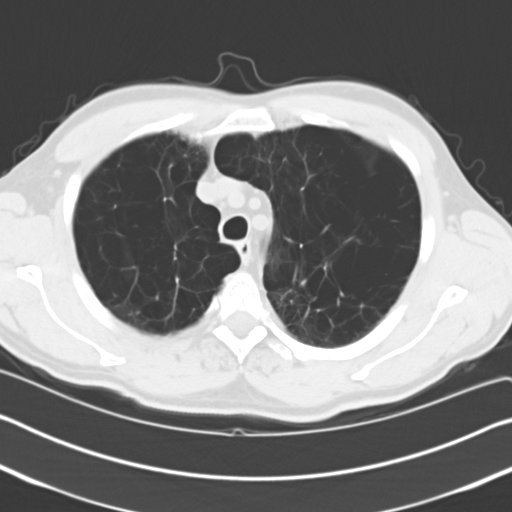
[im 47/61  lung]
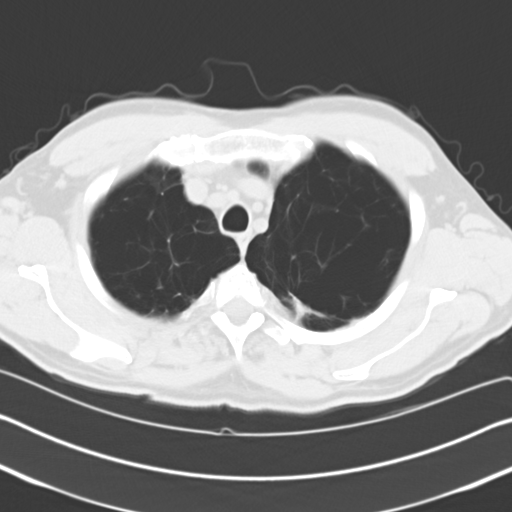
[im 52/61  lung]
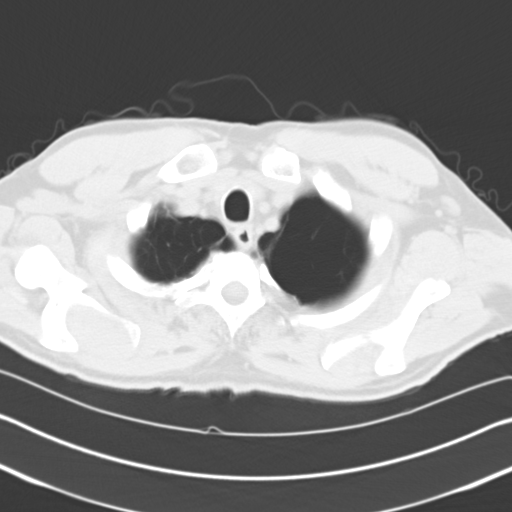
[im 56/61  lung]
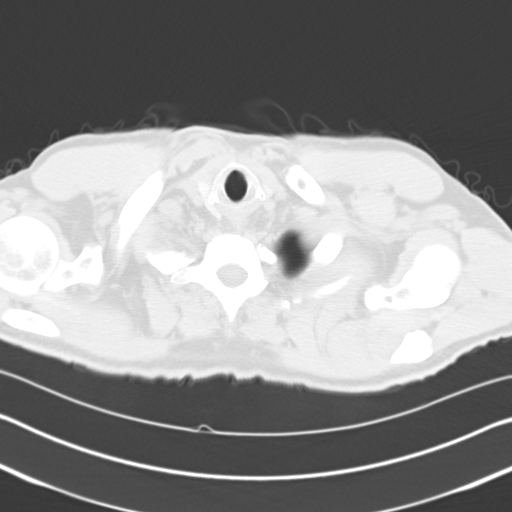

[Series 4: mpr coro 3mm · coronal · 0.61mm/px · 3 of 75 slices shown]
[im 15/75  lung]
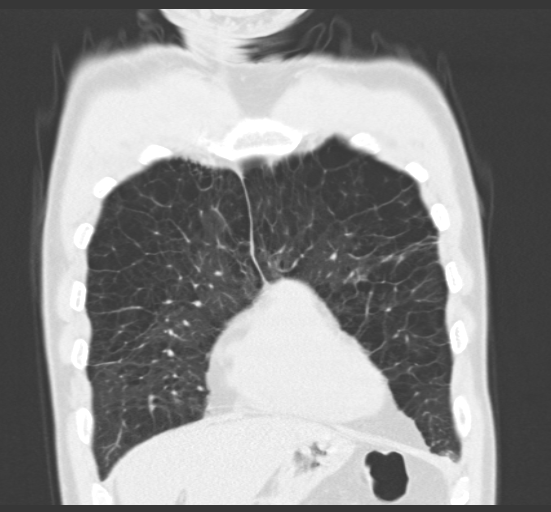
[im 30/75  lung]
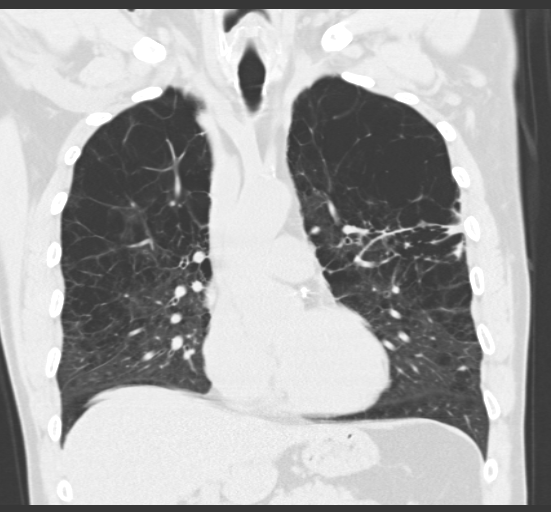
[im 45/75  lung]
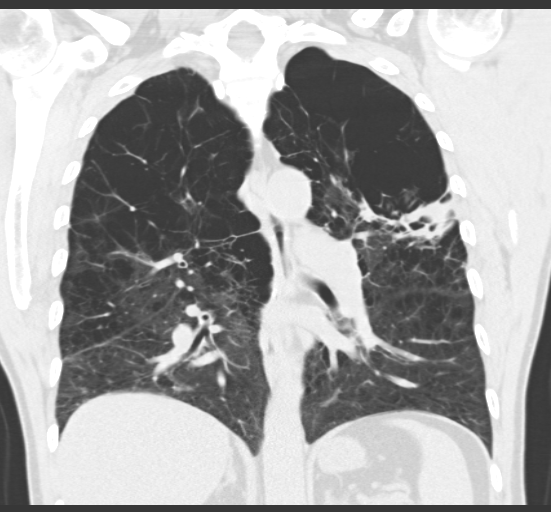

[15 of 36 positions shown; findings below may reference images not displayed]

FINDINGS: Heart size is normal.  No pericardial or pleural
effusion.  No lymphadenopathy.  The previously measured left
suprahilar lymph node cannot be accurately measured on today's exam
due to lack of conspicuity as compared to adjacent noncalcified
vascular structures.  Coronary arterial calcifications are present.

Severe diffuse bilateral emphysematous changes are again noted.
Nodular area of distortion in the left upper lobe measures 1.5 cm
at the same anatomic level, allowing for differences in technique.
Previously seen cavitary lesion at the base of the left upper lobe
is larger, the overall area of consolidation measuring 5.9 x 4.4 cm
by image 28, at the same anatomic level.  A confluent secondary
cavitary component is noted inferiorly, image 30.  5 mm lingular
pulmonary nodule image 45, is stable.  The right lung is clear.

No acute osseous finding.  No lytic or sclerotic osseous lesion.
IMPRESSION: Enlarging cavitary left upper lobe pulmonary mass like lesion.
This could represent progressive primary malignancy (much less
likely metastasis), or progressive infection.  Consider
percutaneous biopsy for further evaluation; due to enlargement,
more of the lesion is in close proximity to the pleura, which may
reduce the chance of biopsy related complication such as
pneumothorax.

## 2012-08-06 ENCOUNTER — Other Ambulatory Visit: Payer: Self-pay | Admitting: *Deleted

## 2012-08-06 DIAGNOSIS — J984 Other disorders of lung: Secondary | ICD-10-CM

## 2012-08-09 ENCOUNTER — Other Ambulatory Visit: Payer: Self-pay | Admitting: *Deleted

## 2012-08-09 DIAGNOSIS — R918 Other nonspecific abnormal finding of lung field: Secondary | ICD-10-CM

## 2012-08-23 IMAGING — CT NM PET TUM IMG INITIAL (PI) SKULL BASE T - THIGH
6 series · 25 of 25 positions shown · IV contrast ([ID])
Comparison: Chest CT on 05/19/2011

CLINICAL DATA: Initial treatment strategy for left lung mass.

NUCLEAR MEDICINE PET CT INITIAL (PI) SKULL BASE TO THIGH
TECHNIQUE: 19.1 mCi F-18 FDG was injected intravenously via the
right arm.  Full-ring PET imaging was performed from the skull base
through the mid-thighs 65  minutes after injection.  CT data was
obtained and used for attenuation correction and anatomic
localization only.  (This was not acquired as a diagnostic CT
examination.)
Fasting Blood Glucose:  102

[Series 1: pet ac · axial · 3.3mm · 4.69mm/px · z∈[-892,-22]mm · 5 of 267 slices shown]
[im 1/267]
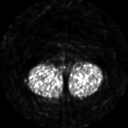
[im 67/267]
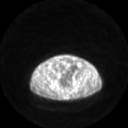
[im 134/267]
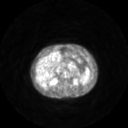
[im 200/267]
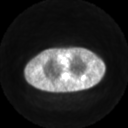
[im 267/267]
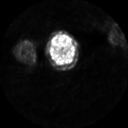

[Series 2: ct images · axial · 3.8mm · 0.98mm/px · z∈[-892,-22]mm · 5 of 267 slices shown]
[im 1/267]
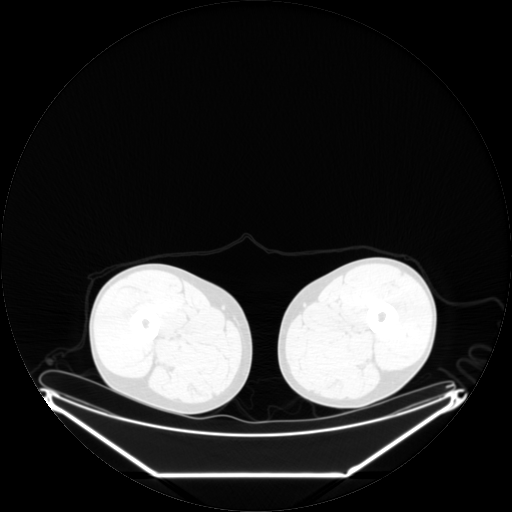
[im 67/267]
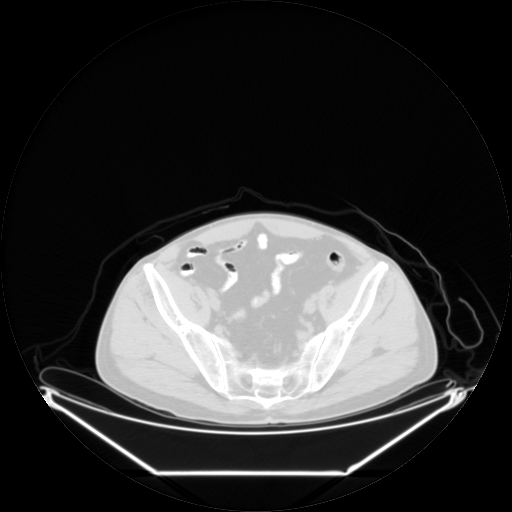
[im 134/267]
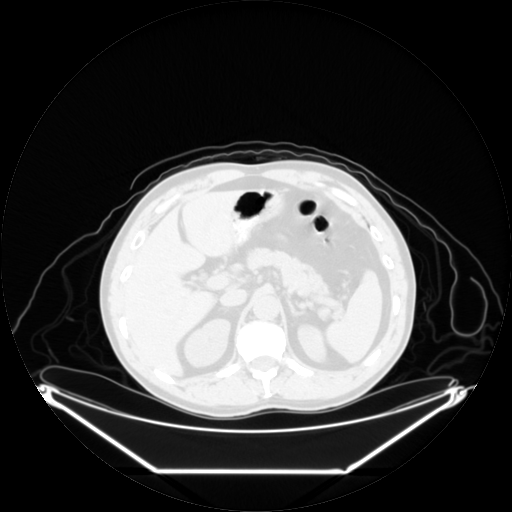
[im 200/267]
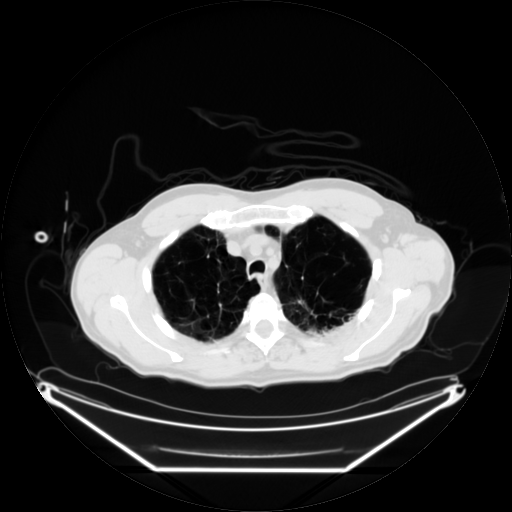
[im 267/267  brain]
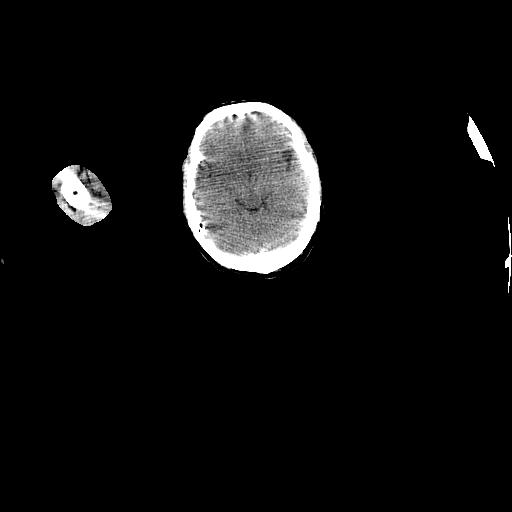

[Series 2: pet nac · axial · 3.3mm · 4.69mm/px · z∈[-892,-22]mm · 6 of 267 slices shown]
[im 1/267]
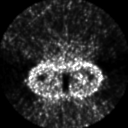
[im 54/267]
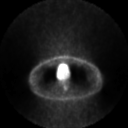
[im 107/267]
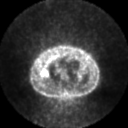
[im 160/267]
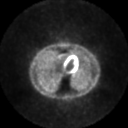
[im 213/267]
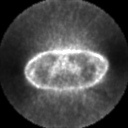
[im 267/267]
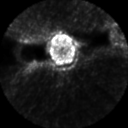

[Series 123: mip · coronal · 3.3mm · 4.69mm/px · 1 of 30 slices shown]
[im 1/30]
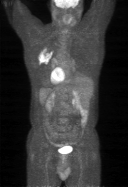

[Series 151: reformatted · axial · 3.3mm · 3.91mm/px · z∈[-892,-22]mm · 6 of 265 slices shown (1 of 2)]
[im 1/265]
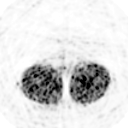
[im 53/265]
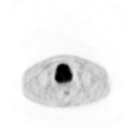
[im 106/265]
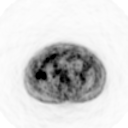
[im 159/265]
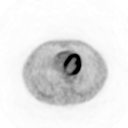
[im 212/265]
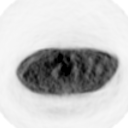
[im 265/265]
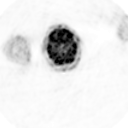

[Series 153: reformatted · coronal · 4.7mm · 6.98mm/px · 2 of 72 slices shown (2 of 2)]
[im 1/72]
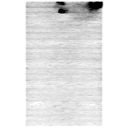
[im 72/72]
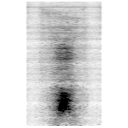

[25 of 25 positions shown; findings below may reference images not displayed]

FINDINGS: Severe pulmonary emphysema again demonstrated.  Ill-
defined confluent opacity in the left upper lobe shows intense
hypermetabolic activity, with maximum SUV of 18.4.

Hypermetabolic left hilar lymphadenopathy is seen, with maximum SUV
of 4.8.  Shotty mediastinal lymph nodes show no hypermetabolic
activity.  A 1 cm left axillary lymph node with fatty hilum shows
mild hypermetabolic activity, with maximum SUV of 2.3.  No other
hypermetabolic lymphadenopathy is identified within the thorax.

No hypermetabolic soft tissue masses or lymphadenopathy identified
within the neck, abdomen, or pelvis.
IMPRESSION: 1.  Ill-defined confluent left upper lobe opacity shows diffuse
hypermetabolic activity.  Differential diagnosis includes
infectious or inflammatory processes as well as bronchogenic
carcinoma.
2.  Hypermetabolic left hilar lymphadenopathy.
3.  1 cm left axillary lymph node with normal fatty hilum shows low
grade hypermetabolic activity.
4.  No evidence of extra-thoracic metastatic disease.

## 2012-08-24 ENCOUNTER — Ambulatory Visit: Payer: Medicare Other | Admitting: Thoracic Surgery (Cardiothoracic Vascular Surgery)

## 2012-08-24 ENCOUNTER — Other Ambulatory Visit: Payer: Medicare Other

## 2012-08-26 IMAGING — CR DG CHEST 2V
2 series · 2 of 2 positions shown · non-contrast
Comparison: 05/06/2011 PET CT.

CLINICAL DATA: Preoperative chest radiograph.

CHEST - 2 VIEW

[view not recorded (1 of 2)]
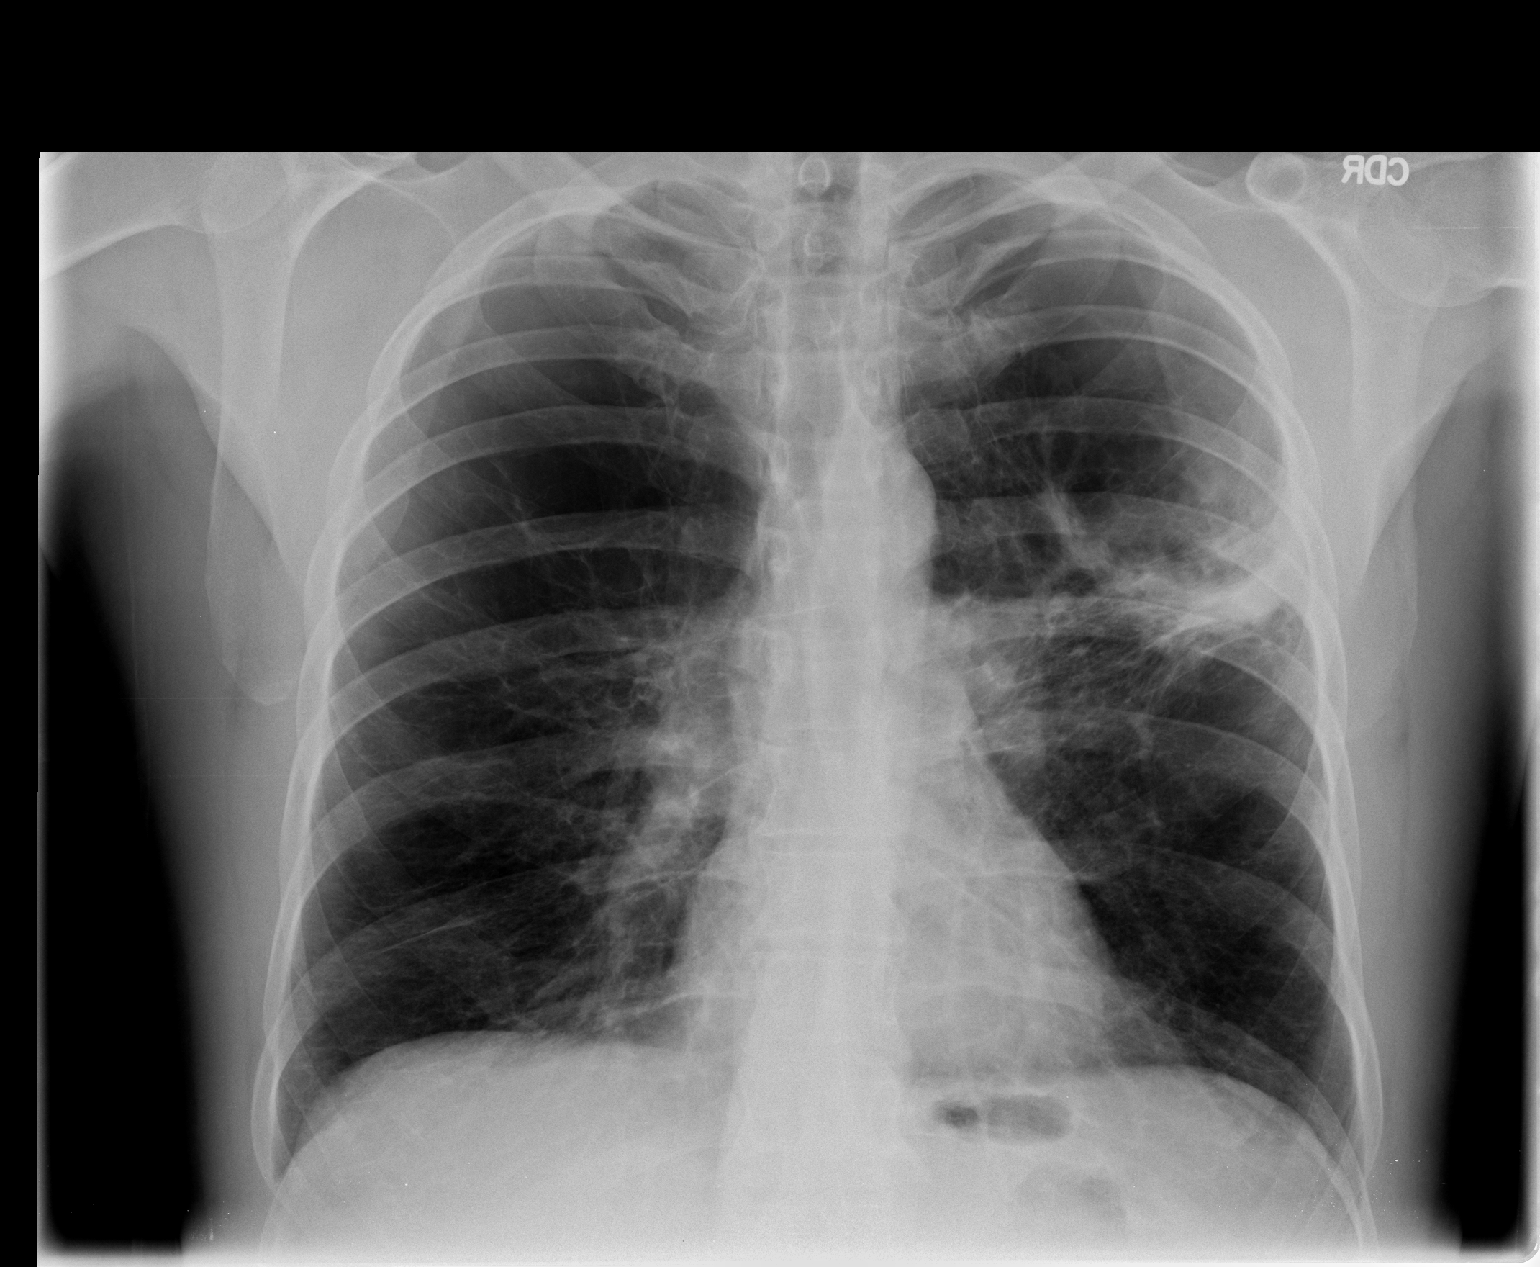

[view not recorded (2 of 2)]
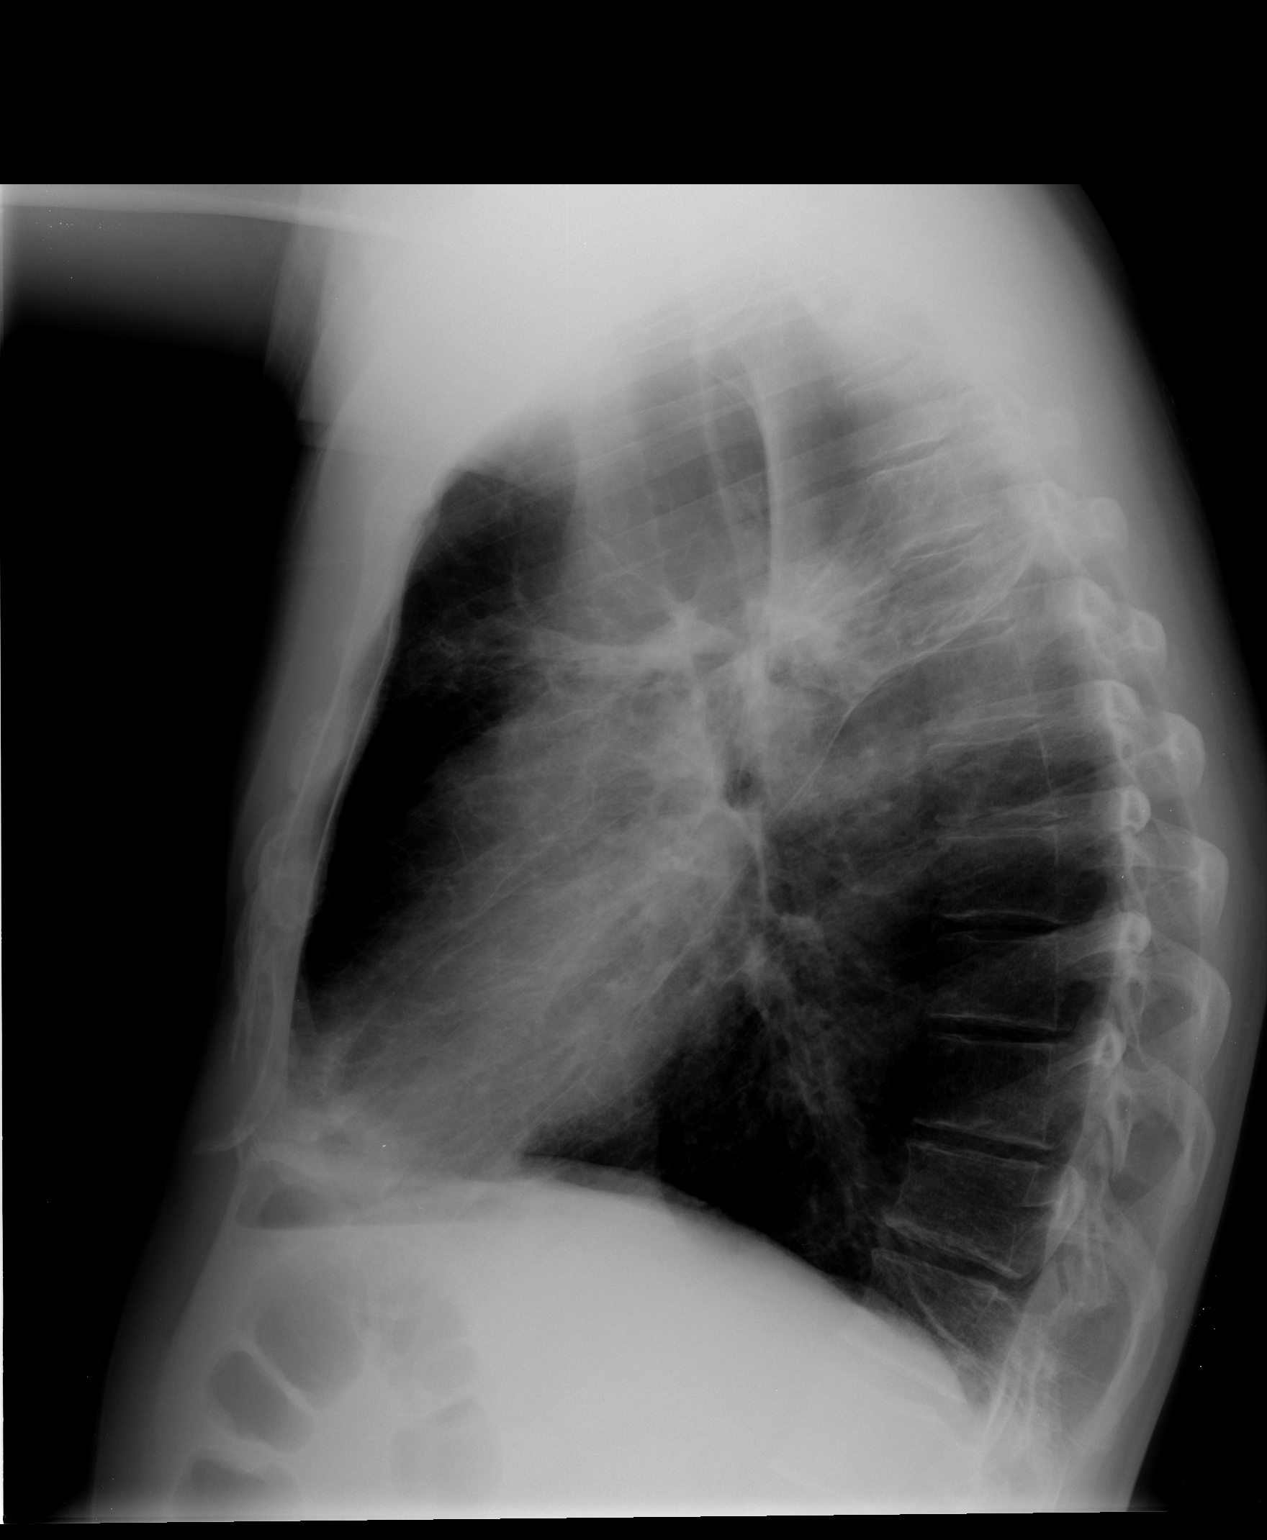

[2 of 2 positions shown; findings below may reference images not displayed]

FINDINGS: Emphysema.  Mass-like density in the left upper lobe
appears little changed compared to the recent PET CT.  Cicatricial
changes are present around the lesion.  Breath estimates of size
are 6 cm transverse by 4 cm cranial-caudal.  Cardiopericardial
silhouette and mediastinal contours are within normal limits.
Negative for effusion.
IMPRESSION: Emphysema.  Left upper lobe mass.  No acute cardiopulmonary
disease.

## 2012-08-29 ENCOUNTER — Emergency Department (HOSPITAL_COMMUNITY)
Admission: EM | Admit: 2012-08-29 | Discharge: 2012-08-30 | Disposition: A | Discharge status: Home / Self Care | Payer: Medicare Other | Attending: Emergency Medicine | Admitting: Emergency Medicine

## 2012-08-29 ENCOUNTER — Emergency Department (HOSPITAL_COMMUNITY): Payer: Medicare Other

## 2012-08-29 ENCOUNTER — Encounter (HOSPITAL_COMMUNITY): Payer: Self-pay | Admitting: *Deleted

## 2012-08-29 DIAGNOSIS — E7800 Pure hypercholesterolemia, unspecified: Secondary | ICD-10-CM | POA: Diagnosis not present

## 2012-08-29 DIAGNOSIS — I2510 Atherosclerotic heart disease of native coronary artery without angina pectoris: Secondary | ICD-10-CM | POA: Insufficient documentation

## 2012-08-29 DIAGNOSIS — M549 Dorsalgia, unspecified: Secondary | ICD-10-CM | POA: Diagnosis not present

## 2012-08-29 DIAGNOSIS — Z9981 Dependence on supplemental oxygen: Secondary | ICD-10-CM | POA: Diagnosis not present

## 2012-08-29 DIAGNOSIS — Z8739 Personal history of other diseases of the musculoskeletal system and connective tissue: Secondary | ICD-10-CM | POA: Diagnosis not present

## 2012-08-29 DIAGNOSIS — G8929 Other chronic pain: Secondary | ICD-10-CM | POA: Diagnosis not present

## 2012-08-29 DIAGNOSIS — W19XXXA Unspecified fall, initial encounter: Secondary | ICD-10-CM

## 2012-08-29 DIAGNOSIS — S0993XA Unspecified injury of face, initial encounter: Secondary | ICD-10-CM | POA: Insufficient documentation

## 2012-08-29 DIAGNOSIS — R05 Cough: Secondary | ICD-10-CM | POA: Diagnosis not present

## 2012-08-29 DIAGNOSIS — Z8719 Personal history of other diseases of the digestive system: Secondary | ICD-10-CM | POA: Insufficient documentation

## 2012-08-29 DIAGNOSIS — S0990XA Unspecified injury of head, initial encounter: Secondary | ICD-10-CM | POA: Diagnosis not present

## 2012-08-29 DIAGNOSIS — IMO0002 Reserved for concepts with insufficient information to code with codable children: Secondary | ICD-10-CM | POA: Insufficient documentation

## 2012-08-29 DIAGNOSIS — J449 Chronic obstructive pulmonary disease, unspecified: Secondary | ICD-10-CM | POA: Insufficient documentation

## 2012-08-29 DIAGNOSIS — J069 Acute upper respiratory infection, unspecified: Secondary | ICD-10-CM | POA: Diagnosis not present

## 2012-08-29 DIAGNOSIS — R079 Chest pain, unspecified: Secondary | ICD-10-CM | POA: Diagnosis not present

## 2012-08-29 DIAGNOSIS — Z8701 Personal history of pneumonia (recurrent): Secondary | ICD-10-CM | POA: Insufficient documentation

## 2012-08-29 DIAGNOSIS — Z7982 Long term (current) use of aspirin: Secondary | ICD-10-CM | POA: Diagnosis not present

## 2012-08-29 DIAGNOSIS — M542 Cervicalgia: Secondary | ICD-10-CM | POA: Diagnosis not present

## 2012-08-29 DIAGNOSIS — Y9389 Activity, other specified: Secondary | ICD-10-CM | POA: Insufficient documentation

## 2012-08-29 DIAGNOSIS — E119 Type 2 diabetes mellitus without complications: Secondary | ICD-10-CM | POA: Insufficient documentation

## 2012-08-29 DIAGNOSIS — Z79899 Other long term (current) drug therapy: Secondary | ICD-10-CM | POA: Diagnosis not present

## 2012-08-29 DIAGNOSIS — W172XXA Fall into hole, initial encounter: Secondary | ICD-10-CM | POA: Insufficient documentation

## 2012-08-29 DIAGNOSIS — F17200 Nicotine dependence, unspecified, uncomplicated: Secondary | ICD-10-CM | POA: Insufficient documentation

## 2012-08-29 DIAGNOSIS — S298XXA Other specified injuries of thorax, initial encounter: Secondary | ICD-10-CM | POA: Diagnosis not present

## 2012-08-29 DIAGNOSIS — R51 Headache: Secondary | ICD-10-CM | POA: Diagnosis not present

## 2012-08-29 DIAGNOSIS — R509 Fever, unspecified: Secondary | ICD-10-CM | POA: Insufficient documentation

## 2012-08-29 DIAGNOSIS — Z8709 Personal history of other diseases of the respiratory system: Secondary | ICD-10-CM | POA: Insufficient documentation

## 2012-08-29 DIAGNOSIS — Y9289 Other specified places as the place of occurrence of the external cause: Secondary | ICD-10-CM | POA: Insufficient documentation

## 2012-08-29 DIAGNOSIS — J209 Acute bronchitis, unspecified: Secondary | ICD-10-CM | POA: Diagnosis not present

## 2012-08-29 DIAGNOSIS — J40 Bronchitis, not specified as acute or chronic: Secondary | ICD-10-CM

## 2012-08-29 DIAGNOSIS — J4489 Other specified chronic obstructive pulmonary disease: Secondary | ICD-10-CM | POA: Insufficient documentation

## 2012-08-29 DIAGNOSIS — Z8669 Personal history of other diseases of the nervous system and sense organs: Secondary | ICD-10-CM | POA: Insufficient documentation

## 2012-08-29 LAB — BASIC METABOLIC PANEL
Chloride: 99 mEq/L (ref 96–112)
GFR calc Af Amer: >90 mL/min (ref >90)
GFR calc non Af Amer: 81 mL/min — ABNORMAL LOW (ref >90)
Potassium: 3.9 mEq/L (ref 3.5–5.1)

## 2012-08-29 LAB — CBC WITH DIFFERENTIAL/PLATELET
Basophils Absolute: 0 10*3/uL (ref 0.0–0.1)
Basophils Relative: 0 % (ref 0–1)
Eosinophils Absolute: 0.1 10*3/uL (ref 0.0–0.7)
MCH: 29.5 pg (ref 26.0–34.0)
MCHC: 35.4 g/dL (ref 30.0–36.0)
Neutro Abs: 7.1 10*3/uL (ref 1.7–7.7)
Neutrophils Relative %: 67 % (ref 43–77)
Platelets: 227 10*3/uL (ref 150–400)
RDW: 13.9 % (ref 11.5–15.5)

## 2012-08-29 LAB — TROPONIN I: Troponin I: <0.3 ng/mL (ref <0.30)

## 2012-08-29 MED ORDER — HYDROCODONE-ACETAMINOPHEN 5-325 MG PO TABS: 2.0000 | ORAL_TABLET | ORAL | Status: DC | PRN

## 2012-08-29 MED ORDER — KETOROLAC TROMETHAMINE 30 MG/ML IJ SOLN
30.0000 mg | Freq: Once | INTRAMUSCULAR | Status: AC
  Administered 2012-08-29: 30 mg via INTRAVENOUS
  Filled 2012-08-29: qty 1

## 2012-08-29 MED ORDER — HYDROCOD POLST-CHLORPHEN POLST 10-8 MG/5ML PO LQCR
5.0000 mL | Freq: Once | ORAL | Status: AC
  Administered 2012-08-29: 5 mL via ORAL
  Filled 2012-08-29: qty 5

## 2012-08-29 MED ORDER — HYDROCODONE-HOMATROPINE 5-1.5 MG/5ML PO SYRP: 5.0000 mL | ORAL_SOLUTION | Freq: Four times a day (QID) | ORAL | Status: DC | PRN

## 2012-08-29 MED ORDER — AZITHROMYCIN 250 MG PO TABS: ORAL_TABLET | ORAL | Status: DC

## 2012-08-29 MED ORDER — AZITHROMYCIN 250 MG PO TABS
500.0000 mg | ORAL_TABLET | Freq: Once | ORAL | Status: AC
  Administered 2012-08-29: 500 mg via ORAL
  Filled 2012-08-29: qty 2

## 2012-08-29 NOTE — ED Notes (Addendum)
Pt states he was sitting at dinner table and began coughing, the next thing he knows, is that he was on the floor. Pt c/o neck pain (only when he coughs) and mid back pain. Right side pain and right ear pain. Pt had been experiencing chest pain x 4-5 days prior to this event but states now his chest pain is gone.

## 2012-08-29 NOTE — ED Notes (Signed)
Pt complains of mid sternal chest pain for 4 days, also notes a cough. States that today while coughing he lost consciousness and fell farward onto the ground, states he does not remember the incident. Pt is concious and AAx4 at current, states he has no chest pain at this time and since he fell the chest pain has gone away.

## 2012-08-29 NOTE — ED Provider Notes (Signed)
History  This chart was scribed for Donnetta Hutching, MD by Erskine Emery, ED Scribe. This patient was seen in room [facility]08/[facility]08 and the patient's care was started at 21:34.   CSN: [id number]  Arrival date & time 08/29/12  2051   First MD Initiated Contact with Patient 08/29/12 2134      Chief Complaint  Patient presents with  . Fall  . Near Syncope  . Back Pain  . Neck Pain  . Chest Pain    (Consider location/radiation/quality/duration/timing/severity/associated sxs/prior treatment) The history is provided by the patient and the spouse. No language interpreter was used.  Ryan [name] is a 52 y.o. male who presents to the Emergency Department complaining of a gradually improving URI, including chest pain, cough, cold chills, and fever for the past 4-5 days. He claims sleeping with a heating pad on his chest and back last night has improved the symptoms. Pt reports a sharp pain that radiates from the left neck to his head every time he coughs. His wife reports he has seemed dazed and weak lately. This evening, after a spell of coughing, he fell onto the garbage can then onto the floor, hitting his face on the side of the garbage can. Pt has a h/o COPD and emphysema and his wife reports he usually gets pneumonia this time of year. Pt has been smoking about a pack/day for the past 45 years and recently was found with a spot on his lungs that his PCP has been monitoring him for. Pt has oxygen at home.  Pt also presents with a constant pain behind his eyes for the past couple weeks. He has a h/o cataracts surgery. He also has a h/o CAD and DDD in his back for which he used to take shots.   Dr[facility] Leodis Sias at [facility] is the pt's PCP.   Past Medical History  Diagnosis Date  . Emphysema   . COPD (chronic obstructive pulmonary disease)   . Hypercholesteremia   . Pneumonia   . Polycythemia   . Coronary artery disease   . Cavitary lesion of lung 05/07/2011  . Borderline diabetes   . Seizures      last seizure 2 yrs ago  . GERD (gastroesophageal reflux disease)   . Diabetes mellitus   . 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 Chronic back pain     Past Surgical History  Procedure Laterality Date  . Angioplasty    . Colonoscopy w/ endoscopic [place]    . Vasectomy    . Throat biopsy    . Lung biopsy    . Vasectomy  1987  . Cardiac surgery      Family History  Problem Relation Age of Onset  . Hypertension Mother   . Diabetes Mother   . Heart attack Mother   . Heart failure Mother   . Hypertension Sister   . Diabetes Sister   . Heart failure Sister     History  Substance Use Topics  . Smoking status: Current Every Day Smoker -- 1.00 packs/day for 45 years    Types: Cigarettes    Last Attempt to Quit: 06/22/2011  . Smokeless tobacco: Former Neurosurgeon  . Alcohol Use: No      Review of Systems A complete 10 system review of systems was obtained and all systems are negative except as noted in the HPI and PMH.    Allergies  Influenza vaccine live  Home Medications  Current Outpatient Rx  Name  Route  Sig  Dispense  Refill  . albuterol (VENTOLIN HFA) 108 (90 BASE) MCG/ACT inhaler   Inhalation   Inhale 2 puffs into the lungs every 4 (four) hours as needed. Shortness of breath         . aspirin EC 81 MG tablet   Oral   Take 81 mg by mouth daily.           [name] atorvastatin (LIPITOR) 20 MG tablet   Oral   Take 20 mg by mouth at bedtime.          . diazepam (VALIUM) 5 MG tablet   Oral   Take 2 tablets by mouth Once daily as needed. Takes 2 tablets approximately 1.5 hour before procedure, then take 2 tablets 30 minutes before procedure. Receives injections in neck for degenerative disk disease.         [name] Fluticasone-Salmeterol (ADVAIR DISKUS) 250-50 MCG/DOSE AEPB   Inhalation   Inhale 1 puff into the lungs every 12 (twelve) hours.          [name] oxyCODONE-acetaminophen (PERCOCET/ROXICET) 5-325 MG per tablet   Oral   Take 1 tablet  by mouth every 4 (four) hours as needed for pain.   15 tablet   0   . tiotropium (SPIRIVA) 18 MCG inhalation capsule   Inhalation   Place 18 mcg into inhaler and inhale daily.           Triage Vitals: BP 112/79  Pulse 95  Temp(Src) 98 F (36.7 C) (Oral)  Resp 20  Ht 5\' 8"  (1.727 m)  Wt 140 lb (63.504 kg)  BMI 21.29 kg/m2  SpO2 94%  Physical Exam  Nursing note and vitals reviewed. Constitutional: He is oriented to person, place, and time. He appears well-developed and well-nourished.  HENT:  Head: Normocephalic and atraumatic.  Eyes: Conjunctivae and EOM are normal. Pupils are equal, round, and reactive to light.  Neck: Normal range of motion.  Tender in neck.  Cardiovascular: Normal rate, regular rhythm and normal heart sounds.   Pulmonary/Chest: Effort normal and breath sounds normal.  Lungs are clear.  Abdominal: Soft. Bowel sounds are normal.  Musculoskeletal: Normal range of motion.  Neurological: He is alert and oriented to person, place, and time.  Skin: Skin is warm and dry.  Psychiatric: He has a normal mood and affect.    ED Course  Procedures (including critical care time) DIAGNOSTIC STUDIES: Oxygen Saturation is 94% on room air, adequate by my interpretation.    COORDINATION OF CARE: 21:43--I evaluated the patient and we discussed a treatment plan including x-rays, blood work, and labs to which the pt agreed.   Labs Reviewed  CBC WITH DIFFERENTIAL - Abnormal; Notable for the following:    WBC 10.7 (*)    All other components within normal limits  BASIC METABOLIC PANEL - Abnormal; Notable for the following:    Sodium 134 (*)    Glucose, Bld 103 (*)    GFR calc non Af Amer 81 (*)    All other components within normal limits  TROPONIN I    Dg Chest 2 View  08/29/2012  *RADIOLOGY REPORT*  Clinical Data: Fall, near-syncope  CHEST - 2 VIEW  Comparison: Prior CT scan of the chest 06/26/2012; prior chest x- ray also dated 06/26/2012  Findings: Stable  appearance of cavitary mass in the periphery of the left upper lobe.  Background changes of COPD and severe emphysema are similar to prior.  Cardiac and mediastinal contours are unchanged.  No new airspace consolidation, pneumothorax or pleural effusion.  No acute osseous abnormality.  IMPRESSION:  1.  No acute cardiopulmonary disease. 2.  Stable cavitary mass in the periphery of the left upper lobe 3.  Background changes of severe COPD/emphysema are unchanged.   Original Report Authenticated By: Malachy Moan, M.D.    Ct Head Wo Contrast  08/29/2012  *RADIOLOGY REPORT*  Clinical Data:  Fall, near-syncope, neck pain  CT HEAD WITHOUT CONTRAST CT CERVICAL SPINE WITHOUT CONTRAST  Technique:  Multidetector CT imaging of the head and cervical spine was performed following the standard protocol without intravenous contrast.  Multiplanar CT image reconstructions of the cervical spine were also generated.  Comparison:  Prior PET CT 06/06/2011  CT HEAD  Findings: No acute intracranial hemorrhage, acute infarction, mass lesion, mass effect, hydrocephalus or midline shift.  No focal soft tissue swelling or significant scalp hematoma.  Unremarkable globes and orbits.  Normal aeration of the mastoid air cells and paranasal sinuses.  No focal calvarial abnormality.  IMPRESSION: No acute intracranial abnormality.  Normal appearance of the brain.  CT CERVICAL SPINE  Findings: This no acute fracture, malalignment or prevertebral soft tissue swelling.  Very mild multilevel spondylitic changes without focality.  Normal anatomic alignment.  Severe biapical pulmonary emphysema.  IMPRESSION:  1.  No acute fracture or malalignment. 2.  Severe biapical emphysema.   Original Report Authenticated By: Malachy Moan, M.D.    Ct Cervical Spine Wo Contrast  08/29/2012  *RADIOLOGY REPORT*  Clinical Data:  Fall, near-syncope, neck pain  CT HEAD WITHOUT CONTRAST CT CERVICAL SPINE WITHOUT CONTRAST  Technique:  Multidetector CT imaging of  the head and cervical spine was performed following the standard protocol without intravenous contrast.  Multiplanar CT image reconstructions of the cervical spine were also generated.  Comparison:  Prior PET CT 06/06/2011  CT HEAD  Findings: No acute intracranial hemorrhage, acute infarction, mass lesion, mass effect, hydrocephalus or midline shift.  No focal soft tissue swelling or significant scalp hematoma.  Unremarkable globes and orbits.  Normal aeration of the mastoid air cells and paranasal sinuses.  No focal calvarial abnormality.  IMPRESSION: No acute intracranial abnormality.  Normal appearance of the brain.  CT CERVICAL SPINE  Findings: This no acute fracture, malalignment or prevertebral soft tissue swelling.  Very mild multilevel spondylitic changes without focality.  Normal anatomic alignment.  Severe biapical pulmonary emphysema.  IMPRESSION:  1.  No acute fracture or malalignment. 2.  Severe biapical emphysema.   Original Report Authenticated By: Malachy Moan, M.D.    No results found.   No diagnosis found.   Date: 08/29/2012  Rate: 74  Rhythm: normal sinus rhythm  QRS Axis: normal  Intervals: normal  ST/T Wave abnormalities: normal  Conduction Disutrbances: none  Narrative Interpretation: unremarkable     MDM  CT of the head and cervical spine show no fracture. Patient is aware of cavity lesion in left lung. This is not a new finding.  Prescriptions for Zithromax, Hycodan, Vicodin #10      I personally performed the services described in this documentation, which was scribed in my presence. The recorded information has been reviewed and is accurate.    Donnetta Hutching, MD 08/29/12 [phone]

## 2012-08-30 NOTE — ED Notes (Signed)
Pt requesting pain medication, dr Adriana Simas notified and will put in orders.

## 2012-08-31 ENCOUNTER — Ambulatory Visit: Payer: Medicare Other | Admitting: Thoracic Surgery (Cardiothoracic Vascular Surgery)

## 2012-08-31 ENCOUNTER — Other Ambulatory Visit: Payer: Medicare Other

## 2012-09-03 IMAGING — CR DG CHEST 2V
2 series · 2 of 2 positions shown · non-contrast
Comparison: 06/11/2011, 06/06/2011

CLINICAL DATA: Left upper lobe lesion, status post bronchoscopic
biopsy and culture

CHEST - 2 VIEW

[w chest pa]
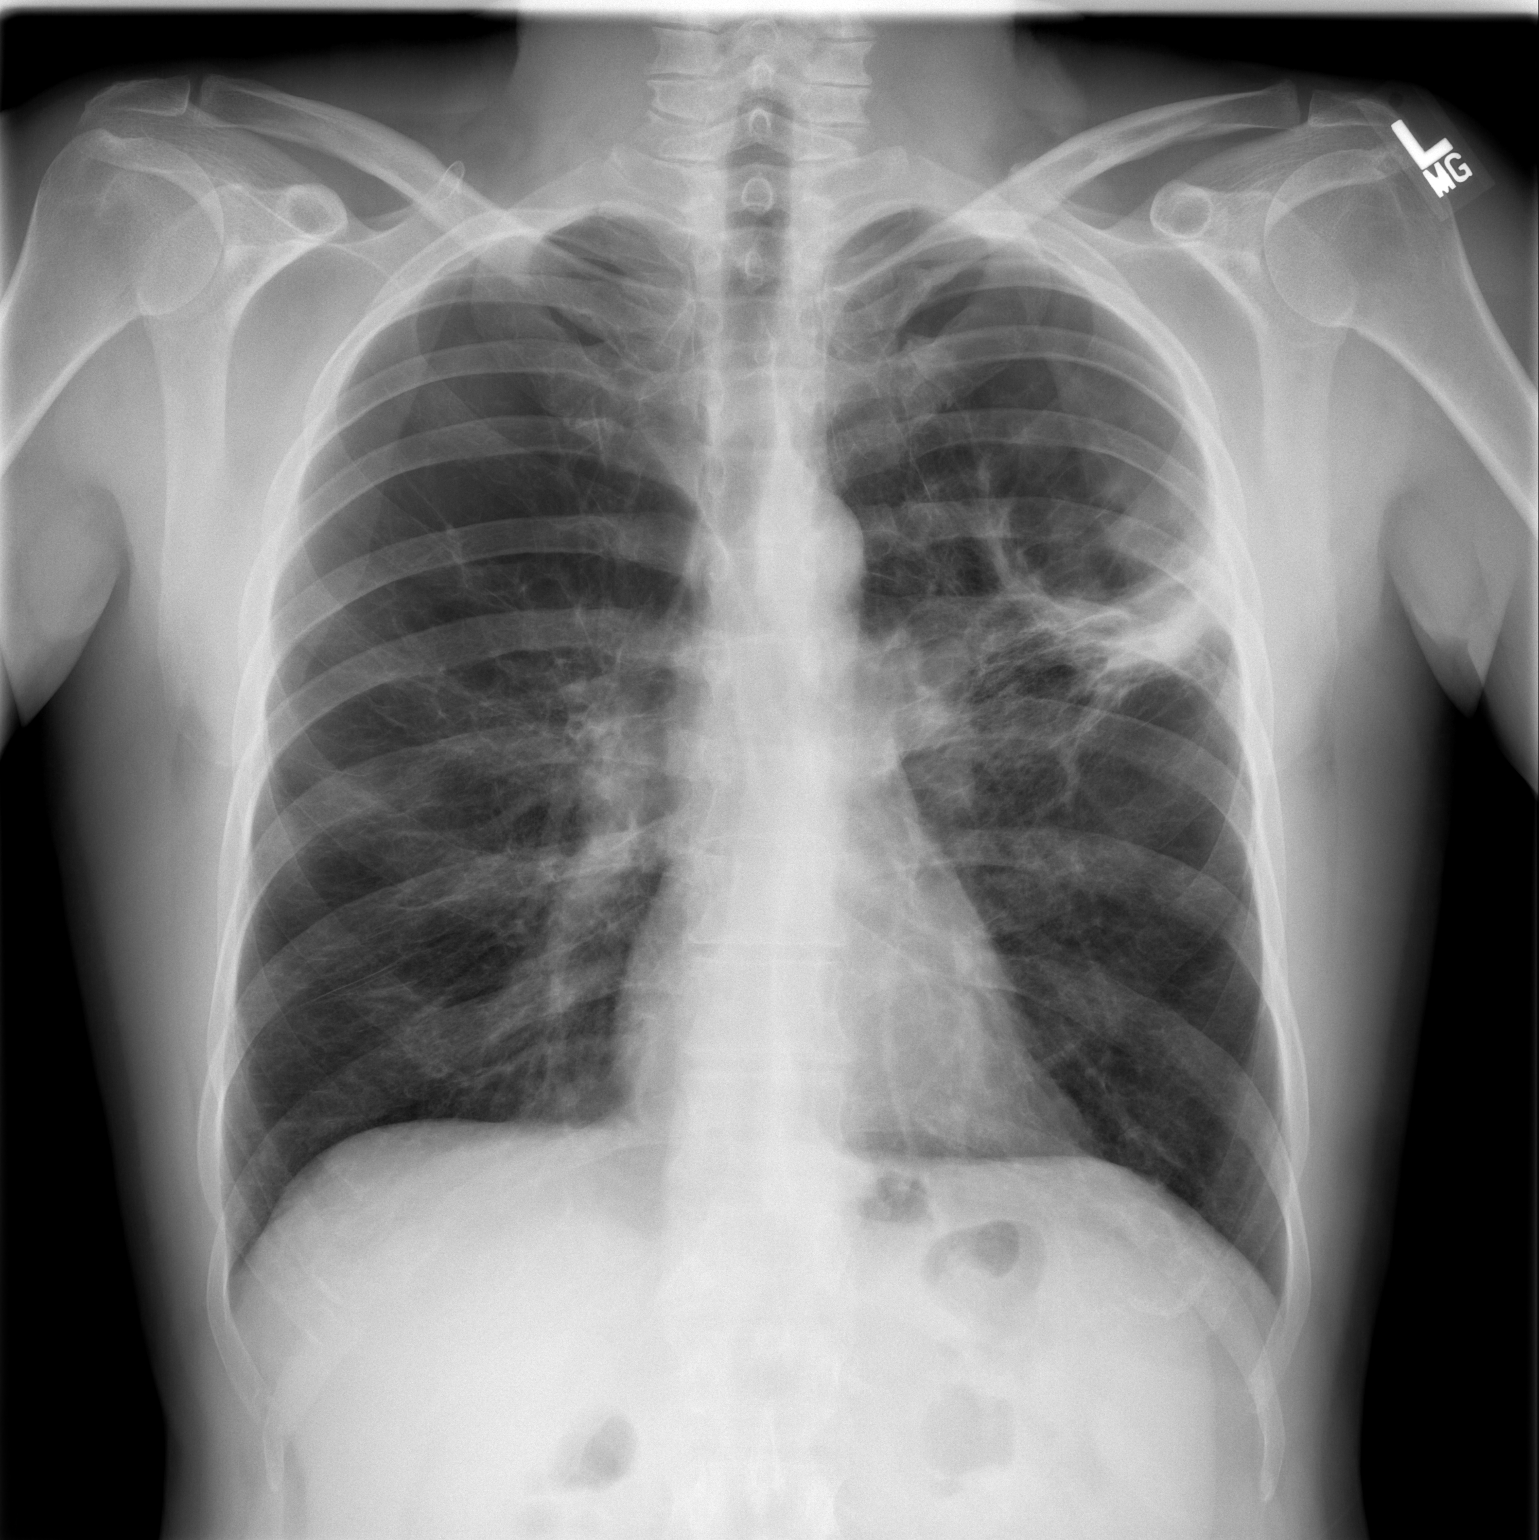

[w chest lat]
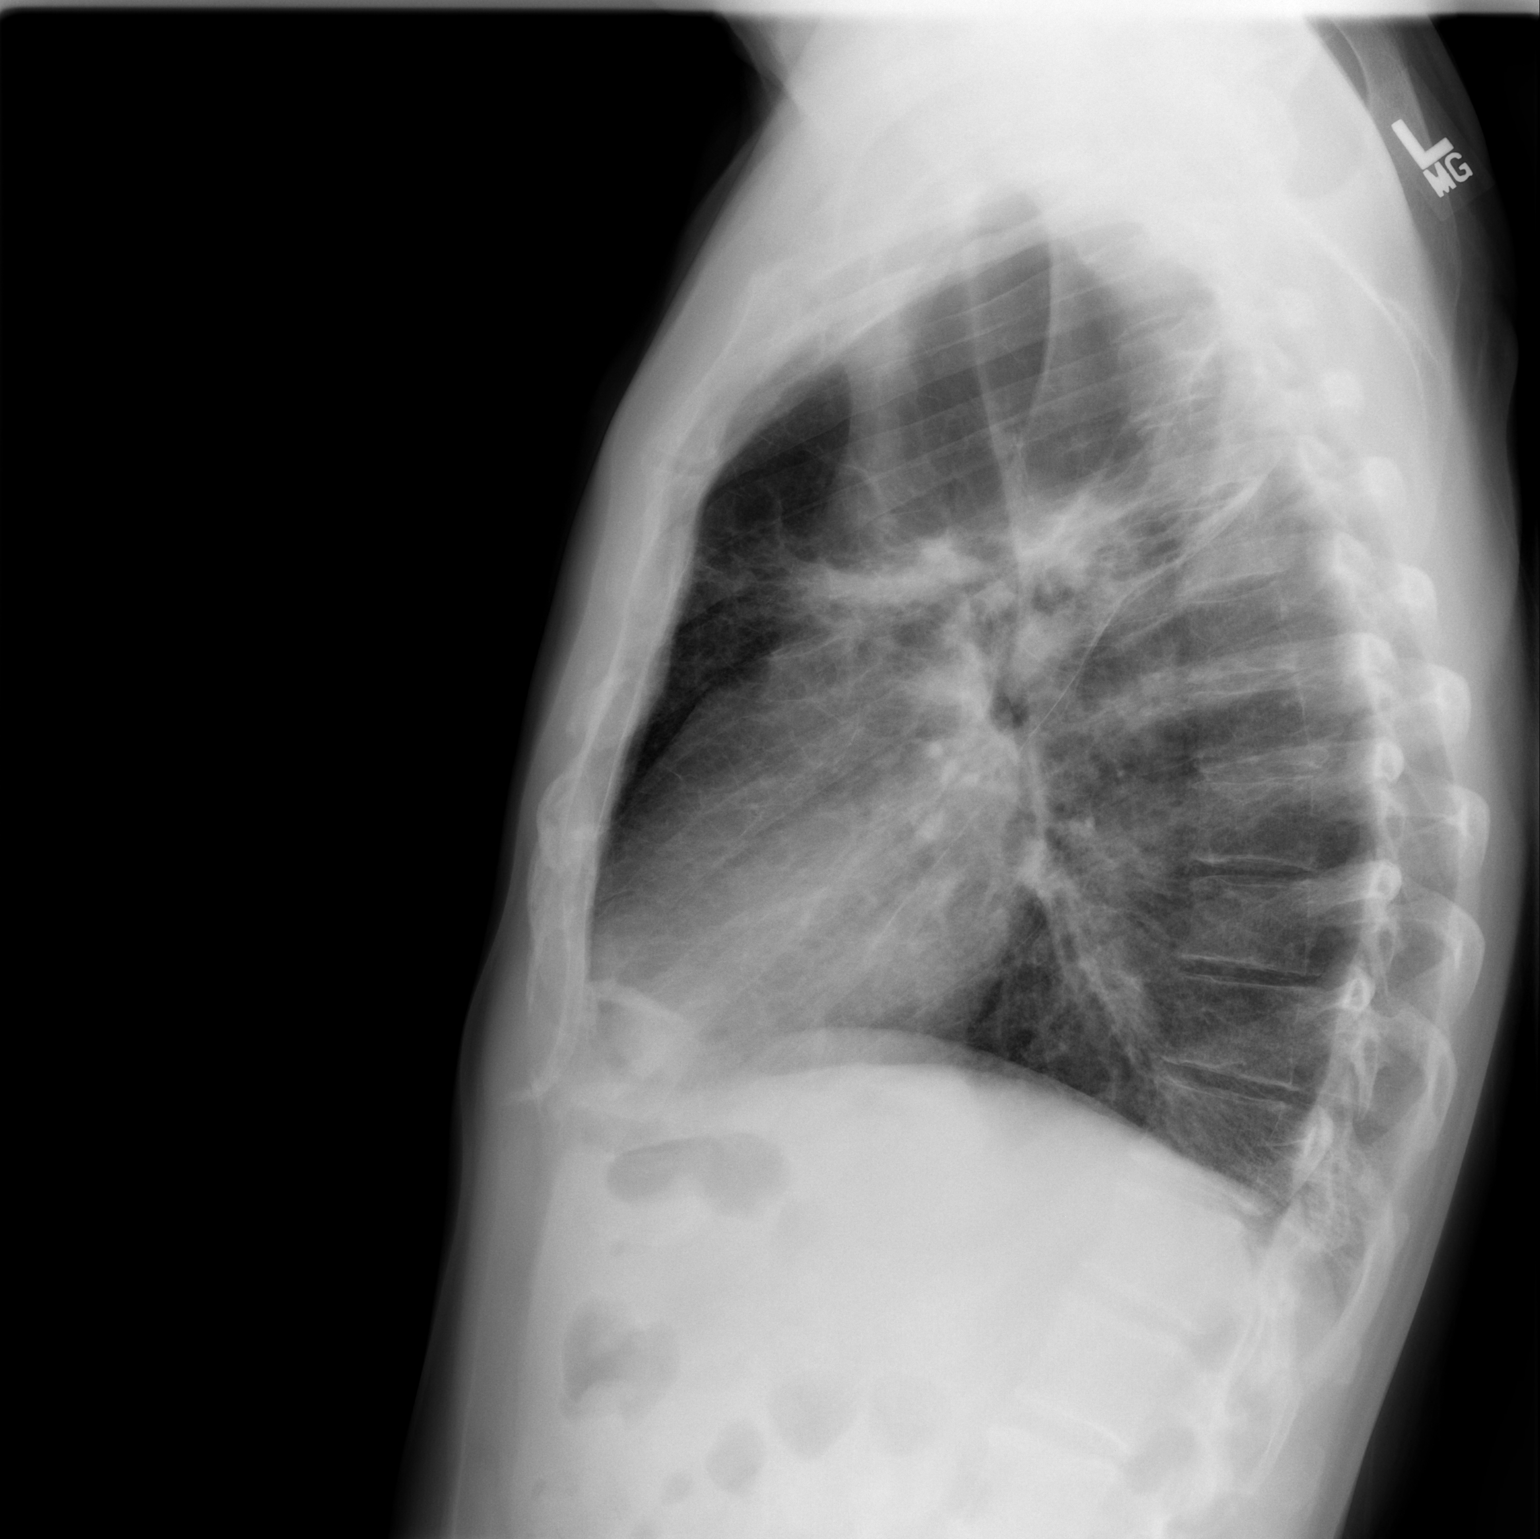

[2 of 2 positions shown; findings below may reference images not displayed]

FINDINGS: Severe bullous emphysema noted throughout both lungs with
hyperinflation.  Irregular peripheral subpleural mass-like opacity
persist, minimal interval change compared to the pre biopsy exam
06/09/2011.  No effusion or pneumothorax.  Stable heart size and
vascularity.  No superimposed CHF or edema.  Trachea midline.
IMPRESSION: Bullous emphysema

Persistent left upper lobe mass-like opacity.

No pleural effusion or pneumothorax following bronchoscopic biopsy.

## 2012-09-07 ENCOUNTER — Ambulatory Visit
Admission: RE | Admit: 2012-09-07 | Discharge: 2012-09-07 | Disposition: A | Discharge status: Home / Self Care | Payer: Medicare Other | Source: Ambulatory Visit | Attending: Thoracic Surgery (Cardiothoracic Vascular Surgery) | Admitting: Thoracic Surgery (Cardiothoracic Vascular Surgery)

## 2012-09-07 ENCOUNTER — Encounter: Payer: Self-pay | Admitting: Thoracic Surgery (Cardiothoracic Vascular Surgery)

## 2012-09-07 ENCOUNTER — Other Ambulatory Visit: Payer: Medicare Other

## 2012-09-07 ENCOUNTER — Ambulatory Visit (INDEPENDENT_AMBULATORY_CARE_PROVIDER_SITE_OTHER): Payer: Medicare Other | Admitting: Thoracic Surgery (Cardiothoracic Vascular Surgery)

## 2012-09-07 VITALS — BP 119/77 | HR 98 | Resp 20 | Ht 68.0 in | Wt 140.0 lb

## 2012-09-07 DIAGNOSIS — F17200 Nicotine dependence, unspecified, uncomplicated: Secondary | ICD-10-CM

## 2012-09-07 DIAGNOSIS — J449 Chronic obstructive pulmonary disease, unspecified: Secondary | ICD-10-CM | POA: Diagnosis not present

## 2012-09-07 DIAGNOSIS — R222 Localized swelling, mass and lump, trunk: Secondary | ICD-10-CM | POA: Diagnosis not present

## 2012-09-07 NOTE — Progress Notes (Signed)
HPI:  Ryan [name] returns for followup today of a left upper lobe cavitary mass. 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 That was done in November of 2012. He was treated by Dr. Juanetta Gosling with antibiotics. He's been followed since that time with serial scans to make sure that the mass did not continue to grow.  He says in the interim since his last visit he was seen in the emergency room after a severe coughing spell resulted in syncope. Reviewing the notes and talking to have it sounds like he had a vasovagal episode. He continues to have a cough. He has not had hemoptysis. His breathing has otherwise been stable. His weight has been stable.  He continues to smoke and feels that he will not be able to stop  Past Medical History  Diagnosis Date  . Emphysema   . COPD (chronic obstructive pulmonary disease)   . Hypercholesteremia   . Pneumonia   . Polycythemia   . Coronary artery disease   . Cavitary lesion of lung 05/07/2011  . Borderline diabetes   . Seizures     last seizure 2 yrs ago  . GERD (gastroesophageal reflux disease)   . Diabetes mellitus   . 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 Chronic back pain       Current Outpatient Prescriptions  Medication Sig Dispense Refill  . albuterol (VENTOLIN HFA) 108 (90 BASE) MCG/ACT inhaler Inhale 2 puffs into the lungs every 4 (four) hours as needed. Shortness of breath      . aspirin EC 81 MG tablet Take 81 mg by mouth daily.        [name] atorvastatin (LIPITOR) 20 MG tablet Take 20 mg by mouth at bedtime.       [name] buPROPion (WELLBUTRIN XL) 300 MG 24 hr tablet Take 300 mg by mouth daily.      . Fluticasone-Salmeterol (ADVAIR DISKUS) 250-50 MCG/DOSE AEPB Inhale 1 puff into the lungs every 12 (twelve) hours.       [name] omeprazole (PRILOSEC) 20 MG capsule Take 20 mg by mouth daily.      [name] tiotropium (SPIRIVA) 18 MCG inhalation capsule Place 18 mcg into inhaler and  inhale daily.      [name] HYDROcodone-acetaminophen (NORCO/VICODIN) 5-325 MG per tablet Take 2 tablets by mouth every 4 (four) hours as needed for pain.  10 tablet  0   No current facility-administered medications for this visit.    Physical Exam BP 119/77  Pulse 98  Resp 20  Ht 5\' 8"  (1.727 m)  Wt 140 lb (63.504 kg)  BMI 21.29 kg/m2  SpO95 23% 52 year old male appearing older than stated age. 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 This appears to be stable based on his current CT of the chest.  I do think we should continue to follow this out until in 2 years from his bronchoscopy. That will be November of this year. I will plan to see him in about 4 months with a CT of the chest, we will do a regular full dose CT rhythm low-dose per the radiologist recommendations.   Plan: Return 4 months with CT of chest  CT  chest result CT CHEST LOW DOSE PILOT WITHOUT CONTRAST  Technique: Multidetector CT imaging of the chest using the standard  low-dose protocol without administration of intravenous contrast.  Comparison: 06/26/2012 and plain film of 08/29/2012.  Findings: Lungs/pleura: Severe centrilobular emphysema. Posterior  left upper lobe irregularly walled cavitary lesion appears similar.  Measures 4.3 x 2.2 cm on image 73/series 3 versus 4.1 x 2.2 cm at  the same level on the prior. In greatest cranial caudal dimension,  5.4 cm on image 110/series 401 sagittal, similar.  A lingular 7 mm nodule on image 166/series 3 was likely present on  the prior exam (image 38/series 4). Better visualized today  secondary to slice thickness.  Similar left-sided pleural fluid/thickening adjacent to the left  upper lobe  cavitary process.  Heart/Mediastinum: Likely similar small axillary nodes. Normal  heart size. Coronary artery atherosclerosis which is age advanced.  No pericardial effusion. Similar small mediastinal nodes. Hilar  regions poorly evaluated secondary to low dose technique and lack  of IV contrast. There is likely similar borderline bilateral hilar  adenopathy.  Upper abdomen: No significant findings. No acute osseous  abnormality.  Bones/Musculoskeletal: No acute osseous abnormality.  IMPRESSION:  1. Similar size of the left upper lobe cavitary mass.  2. Degraded exam, secondary to low dose technique. On follow-up,  recommend routine, full dose CTs.  3. Thoracic lymph nodes are likely similar but poorly evaluated  due to low dose technique.  4. A lingular nodule is present on the prior exam but better  visualized today. Also present back to 09/02/2011. Recommend  attention on follow-up.

## 2012-09-08 IMAGING — CT CT ABD-PELV W/O CM
2 of 3 series · 16 of 46 positions shown, 18 images · non-contrast
Comparison: PET CT dated 06/06/2011

CLINICAL DATA: Left flank pain

CT ABDOMEN AND PELVIS WITHOUT CONTRAST
TECHNIQUE: Multidetector CT imaging of the abdomen and pelvis was
performed following the standard protocol without intravenous
contrast.

[Series 2: standard/full over (age)lbs 5.0 · axial · 0.62mm/px · z∈[+498,+874]mm · 13 of 87 slices shown, 15 images]
[im 6/87  soft-tissue]
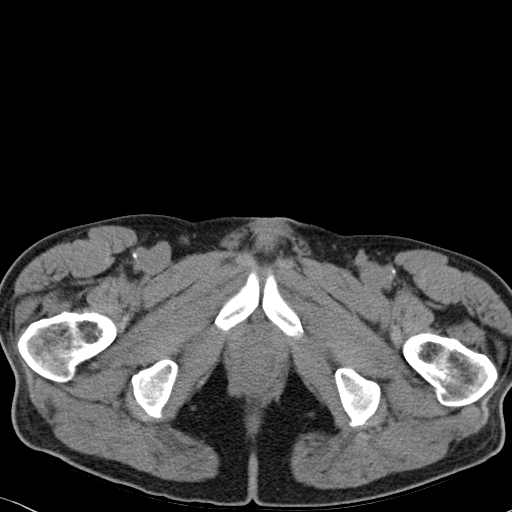
[im 6/87  bone]
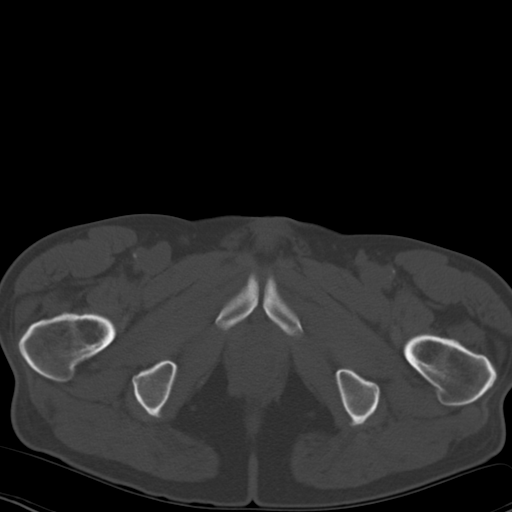
[im 12/87  soft-tissue]
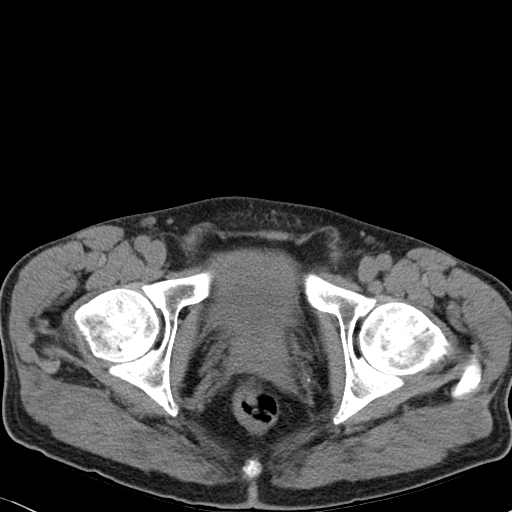
[im 17/87  soft-tissue]
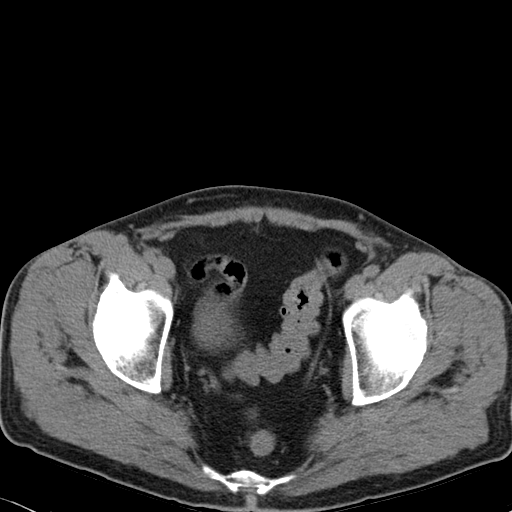
[im 25/87  soft-tissue]
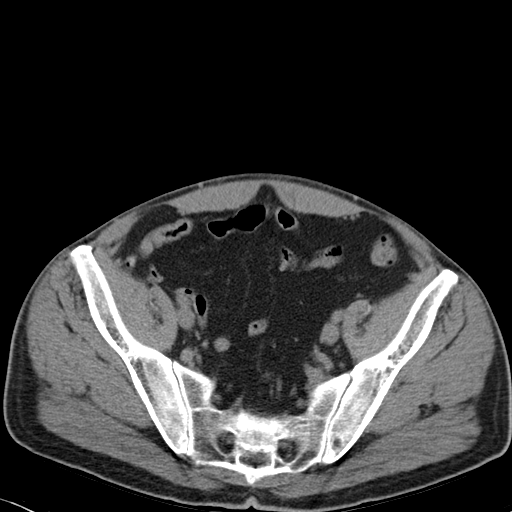
[im 31/87  soft-tissue]
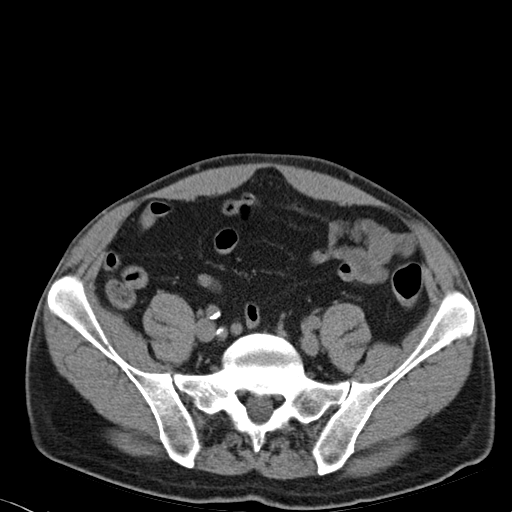
[im 37/87  soft-tissue]
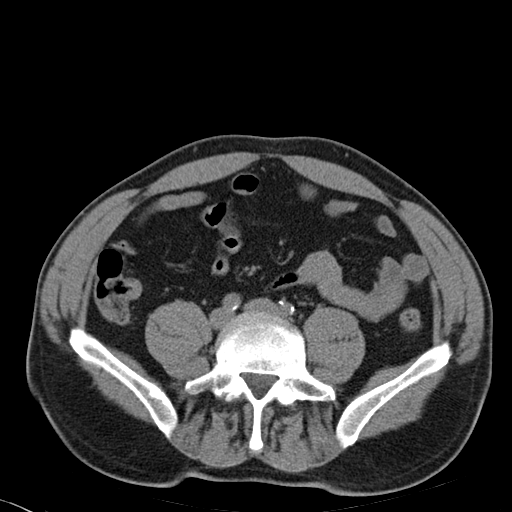
[im 45/87  soft-tissue]
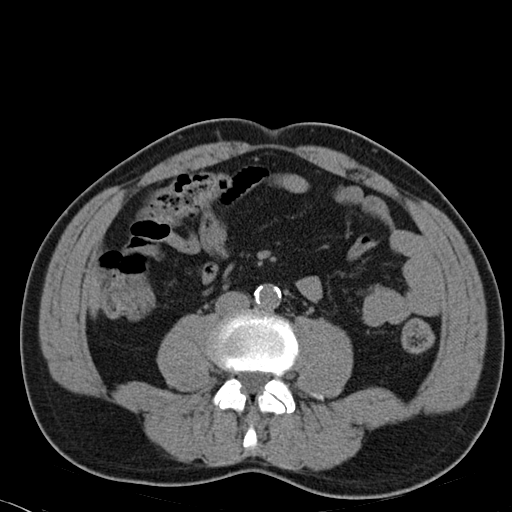
[im 50/87  soft-tissue]
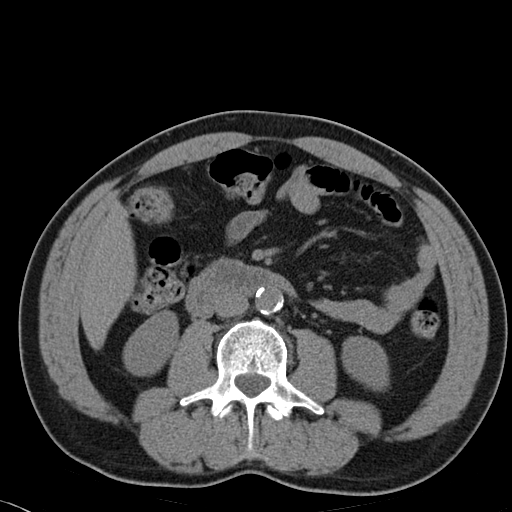
[im 56/87  soft-tissue]
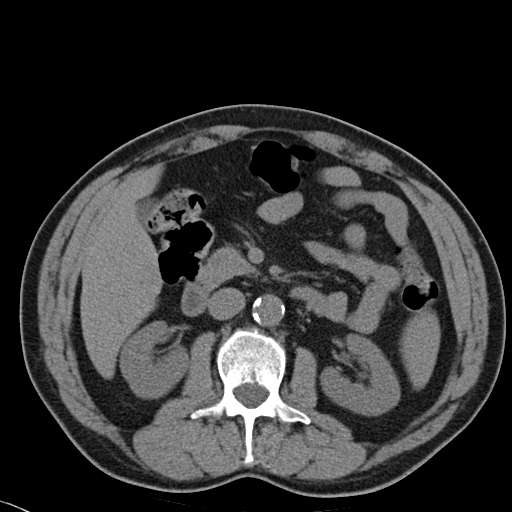
[im 56/87  bone]
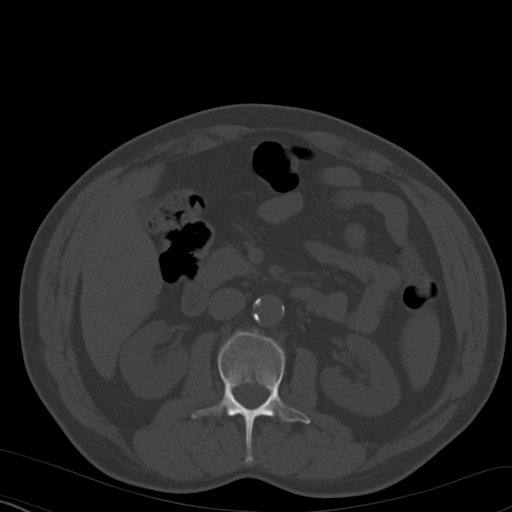
[im 62/87  soft-tissue]
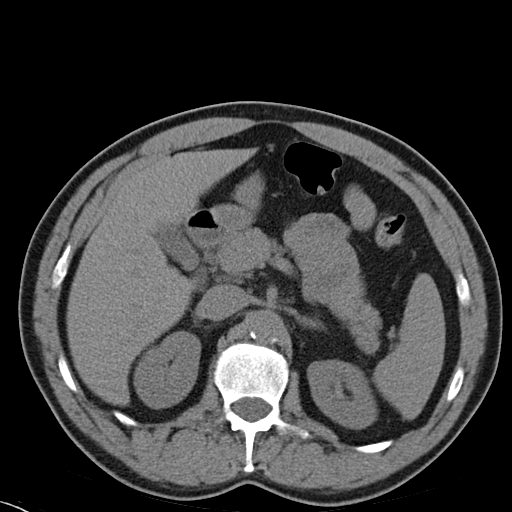
[im 70/87  soft-tissue]
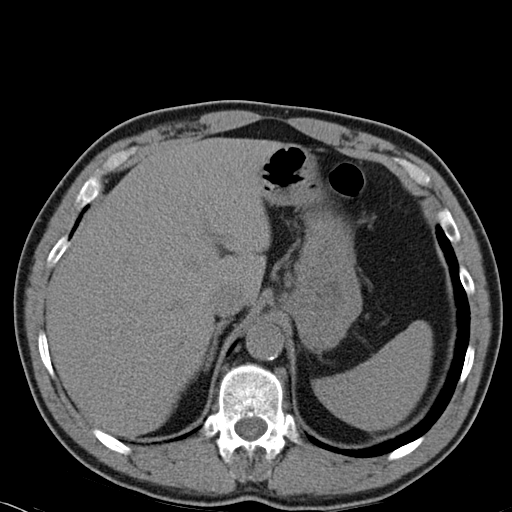
[im 75/87  soft-tissue]
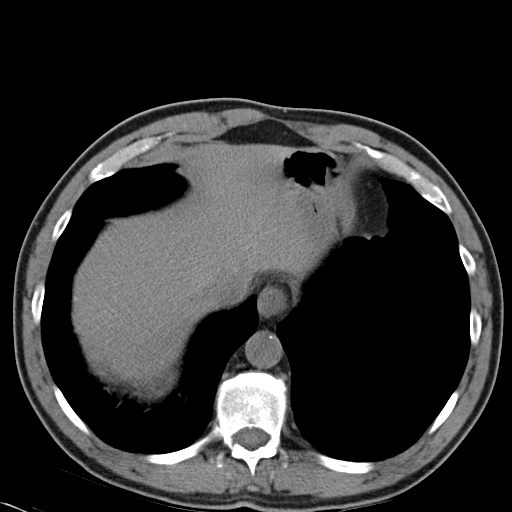
[im 81/87  soft-tissue]
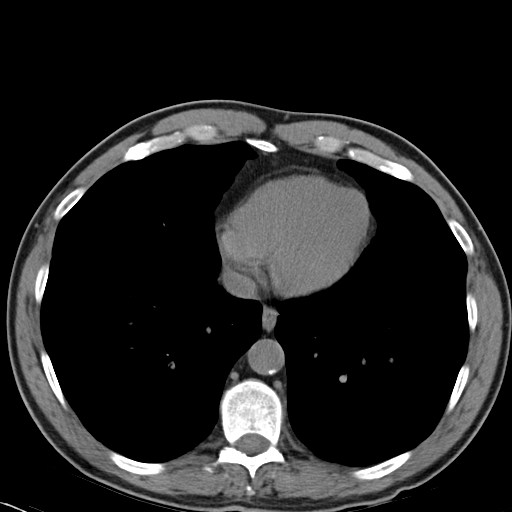

[Series 4: mpr coronal · coronal · 0.63mm/px · 3 of 102 slices shown]
[im 34/102  soft-tissue]
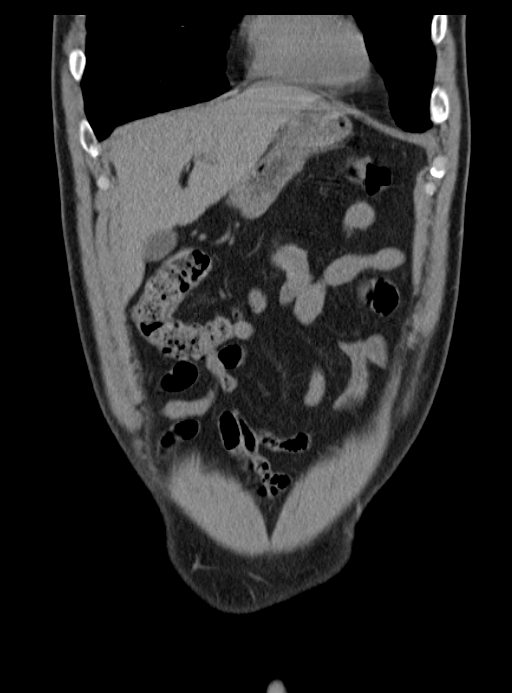
[im 45/102  soft-tissue]
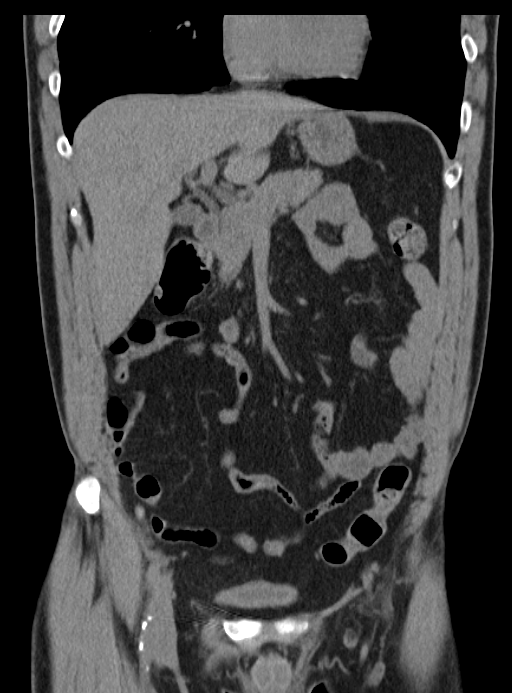
[im 57/102  soft-tissue]
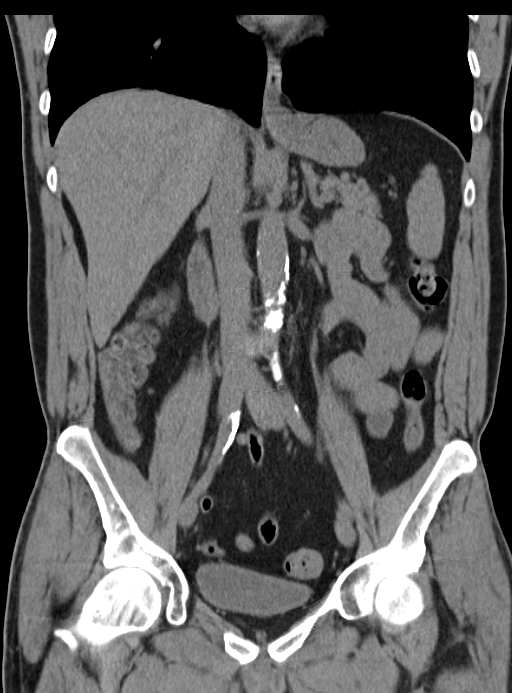

[16 of 46 positions shown; findings below may reference images not displayed]

FINDINGS: Emphysematous changes in the lung bases.  5 mm lingular
nodule (series 3/image 4).

Unenhanced liver, spleen, pancreas, and adrenal glands within
normal limits.

Gallbladder unremarkable.  No intrahepatic or extrahepatic ductal
dilatation.

Kidneys within normal limits.  No renal calculi or hydronephrosis.

No evidence of bowel obstruction.  Normal appendix.  No colonic
wall thickening or inflammatory changes.

Atherosclerotic calcifications of the abdominal aorta and branch
vessels.

No abdominopelvic ascites.

No suspicious abdominopelvic lymphadenopathy.

Prostate is unremarkable.

No ureteral or bladder calculi.

Very mild degenerative changes of the visualized thoracolumbar
spine.
IMPRESSION: No renal, ureteral, or bladder calculi.  No hydronephrosis.

No evidence of bowel obstruction.  Normal appendix.

No CT findings to account for the patient's abdominal pain.

## 2012-09-08 IMAGING — CR DG CHEST 2V
2 series · 2 of 2 positions shown · non-contrast
Comparison: 06/17/2011

CLINICAL DATA: Cough, congestion, and chills.  Smoker.

CHEST - 2 VIEW

[view not recorded (1 of 2)]
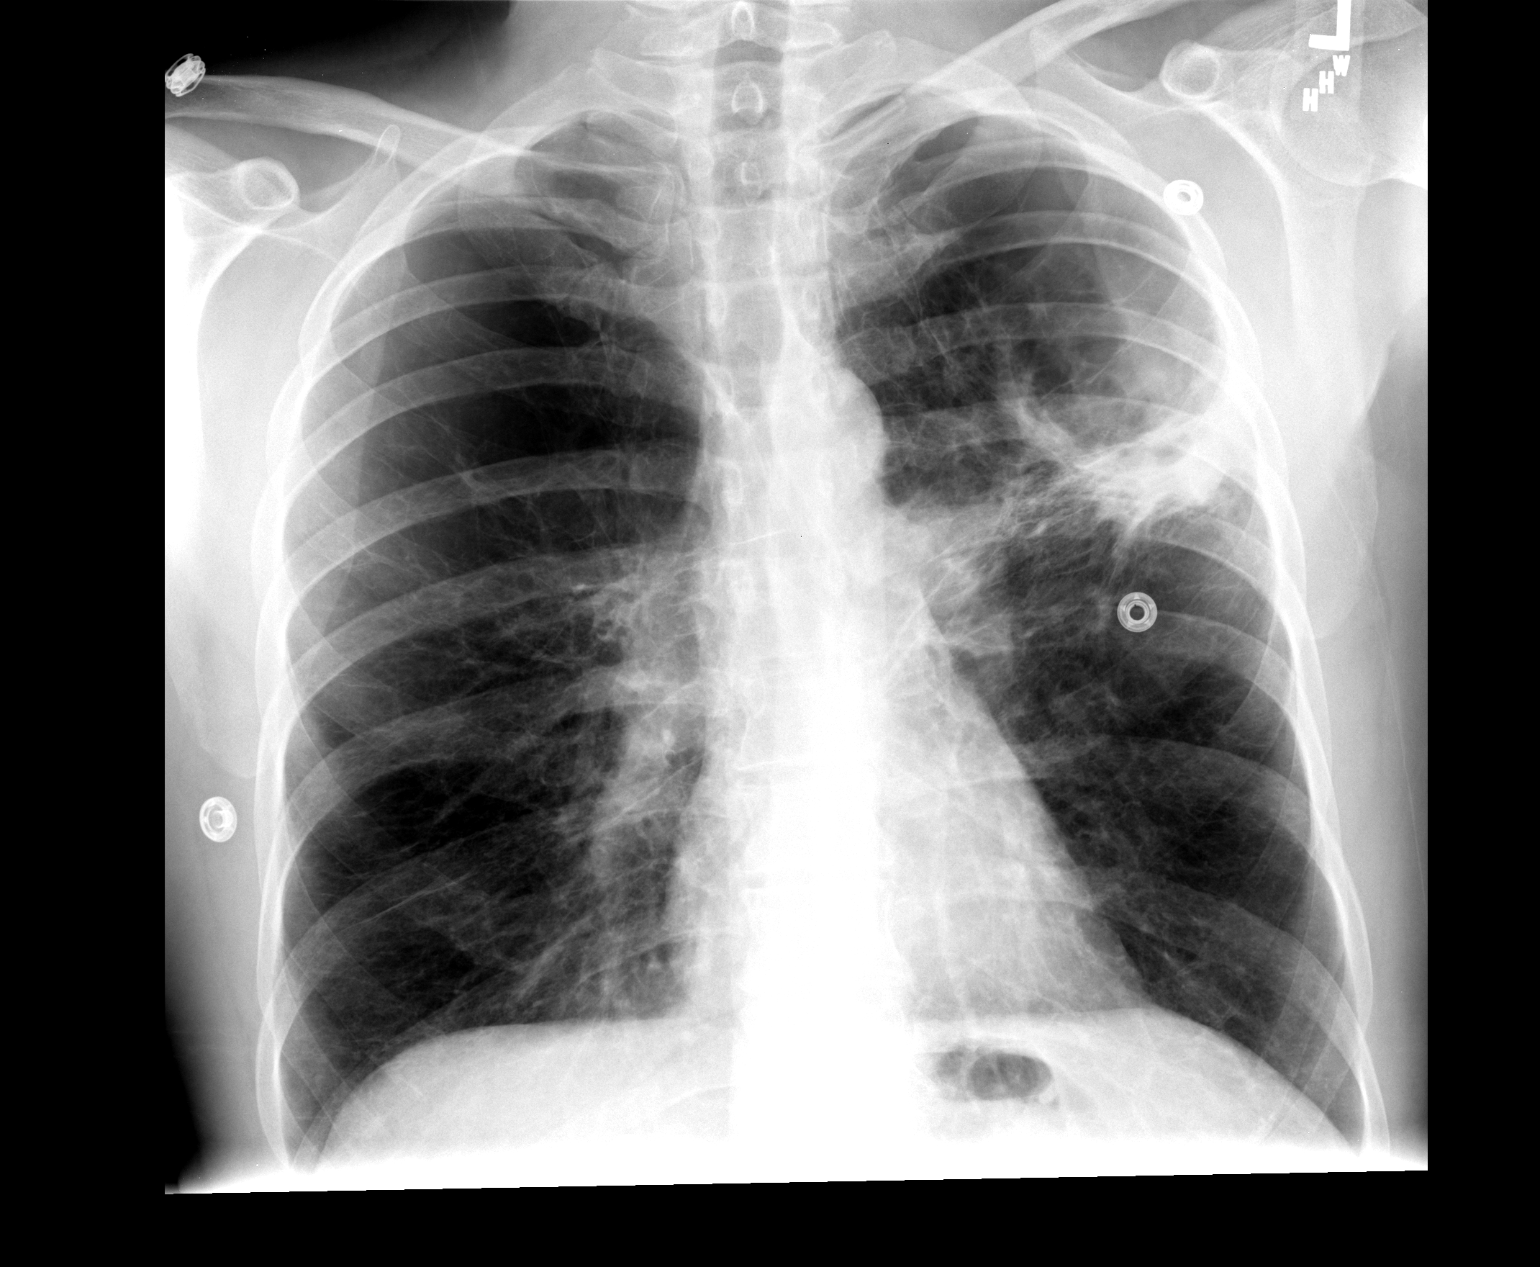

[view not recorded (2 of 2)]
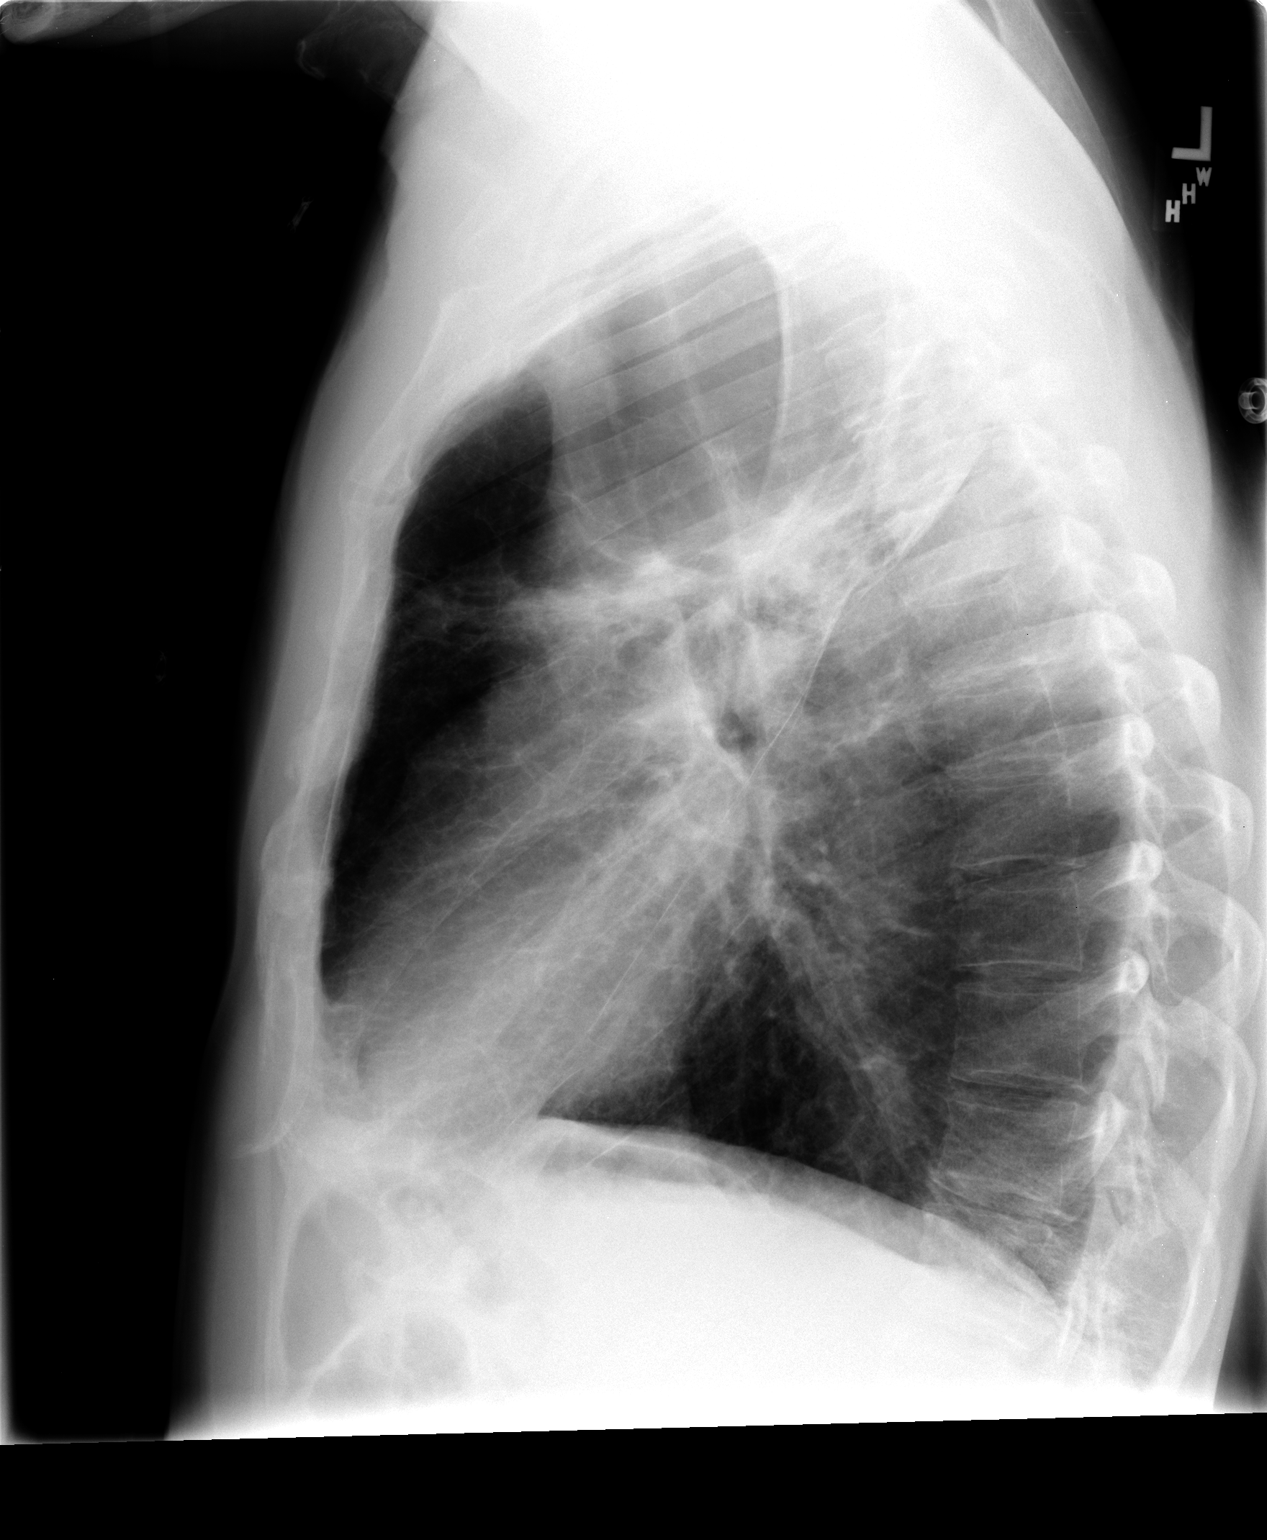

[2 of 2 positions shown; findings below may reference images not displayed]

FINDINGS: Cavitary lesion with scarring is again demonstrated in
the left mid lung region.  The appearance is relatively stable
since the prior study.  This could represent inflammatory process
or mass lesion.  Follow up until resolution is recommended.  There
is no developing infiltration.  Diffuse emphysematous changes and
scattered fibrosis in the lungs.  Normal heart size and pulmonary
vascularity.
IMPRESSION: Persistent cavitary lesion with associated scarring and
infiltration again demonstrated in the left lung.  This appears
stable since the previous study.  Follow up until resolution is
recommended.  Diffuse emphysematous changes.

## 2012-10-02 IMAGING — CR DG CHEST 2V
2 series · 2 of 2 positions shown · non-contrast
Comparison: 06/22/2011

CLINICAL DATA: 4 weeks status post left lung biopsy.  Left lung
neoplasm.

CHEST - 2 VIEW

[view not recorded (1 of 2)]
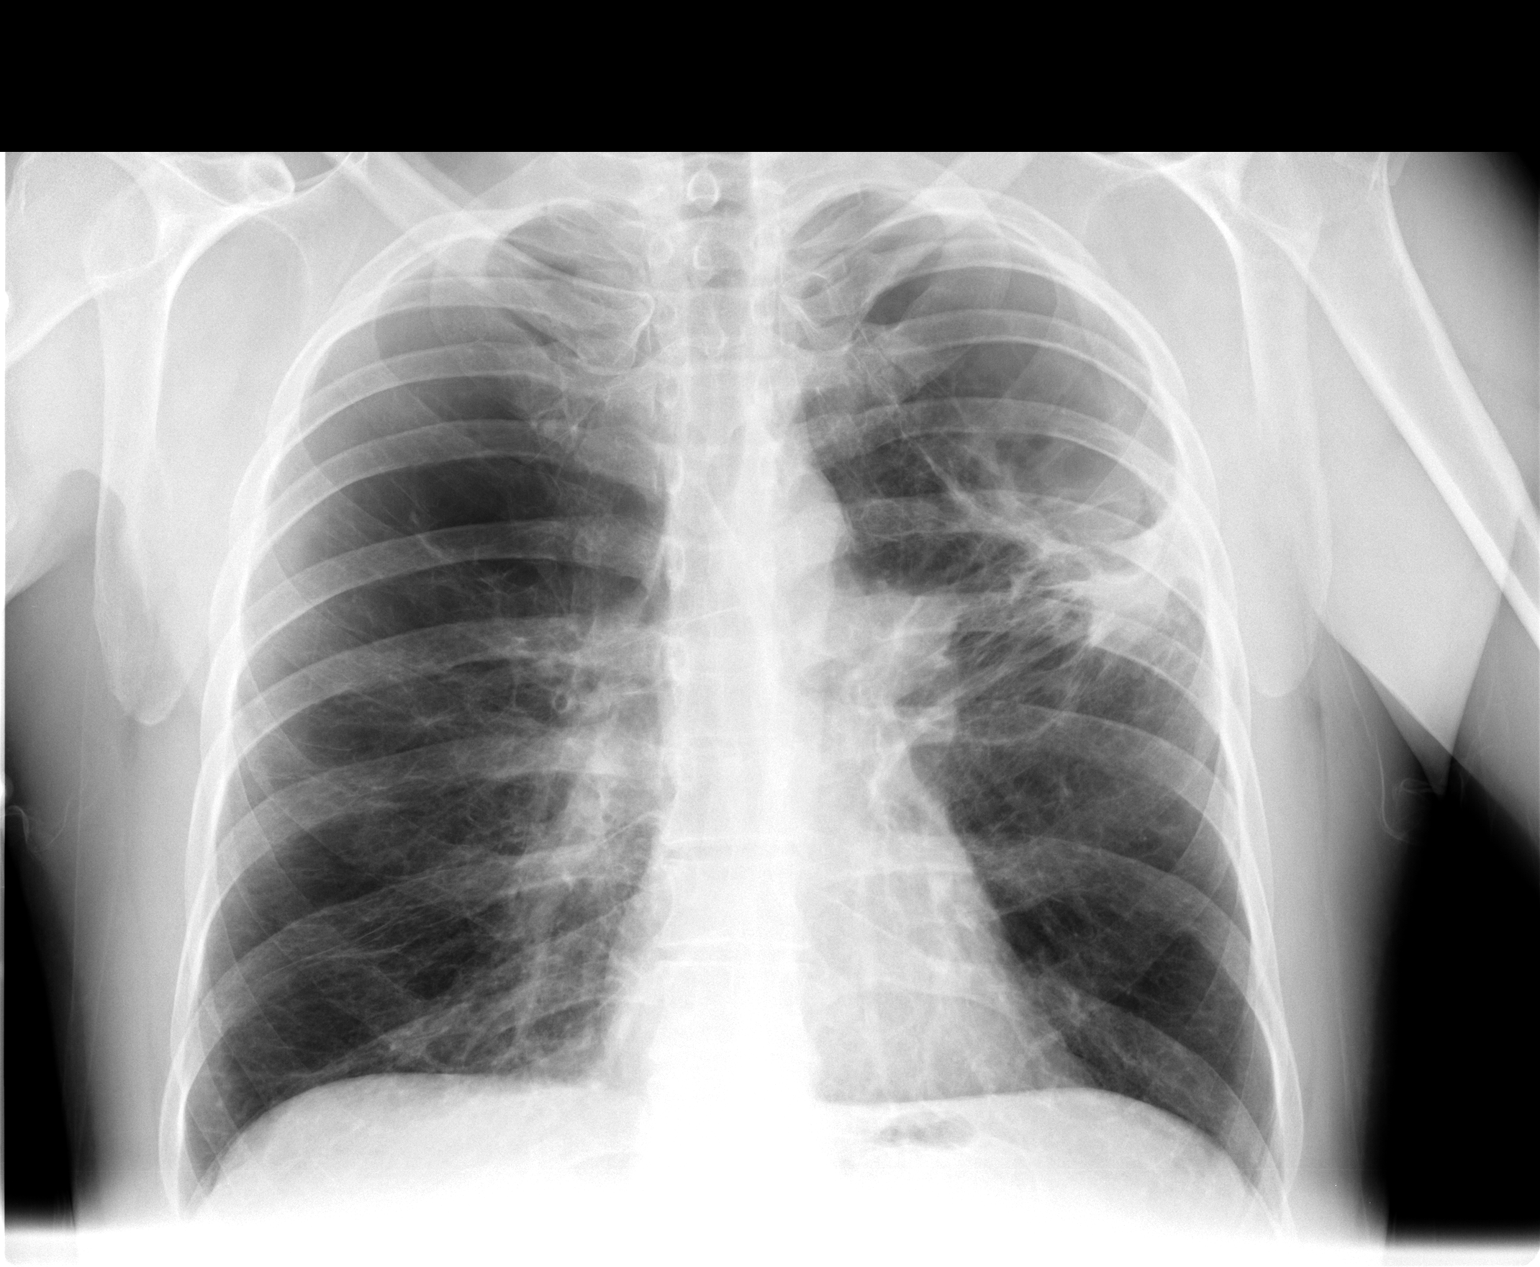

[view not recorded (2 of 2)]
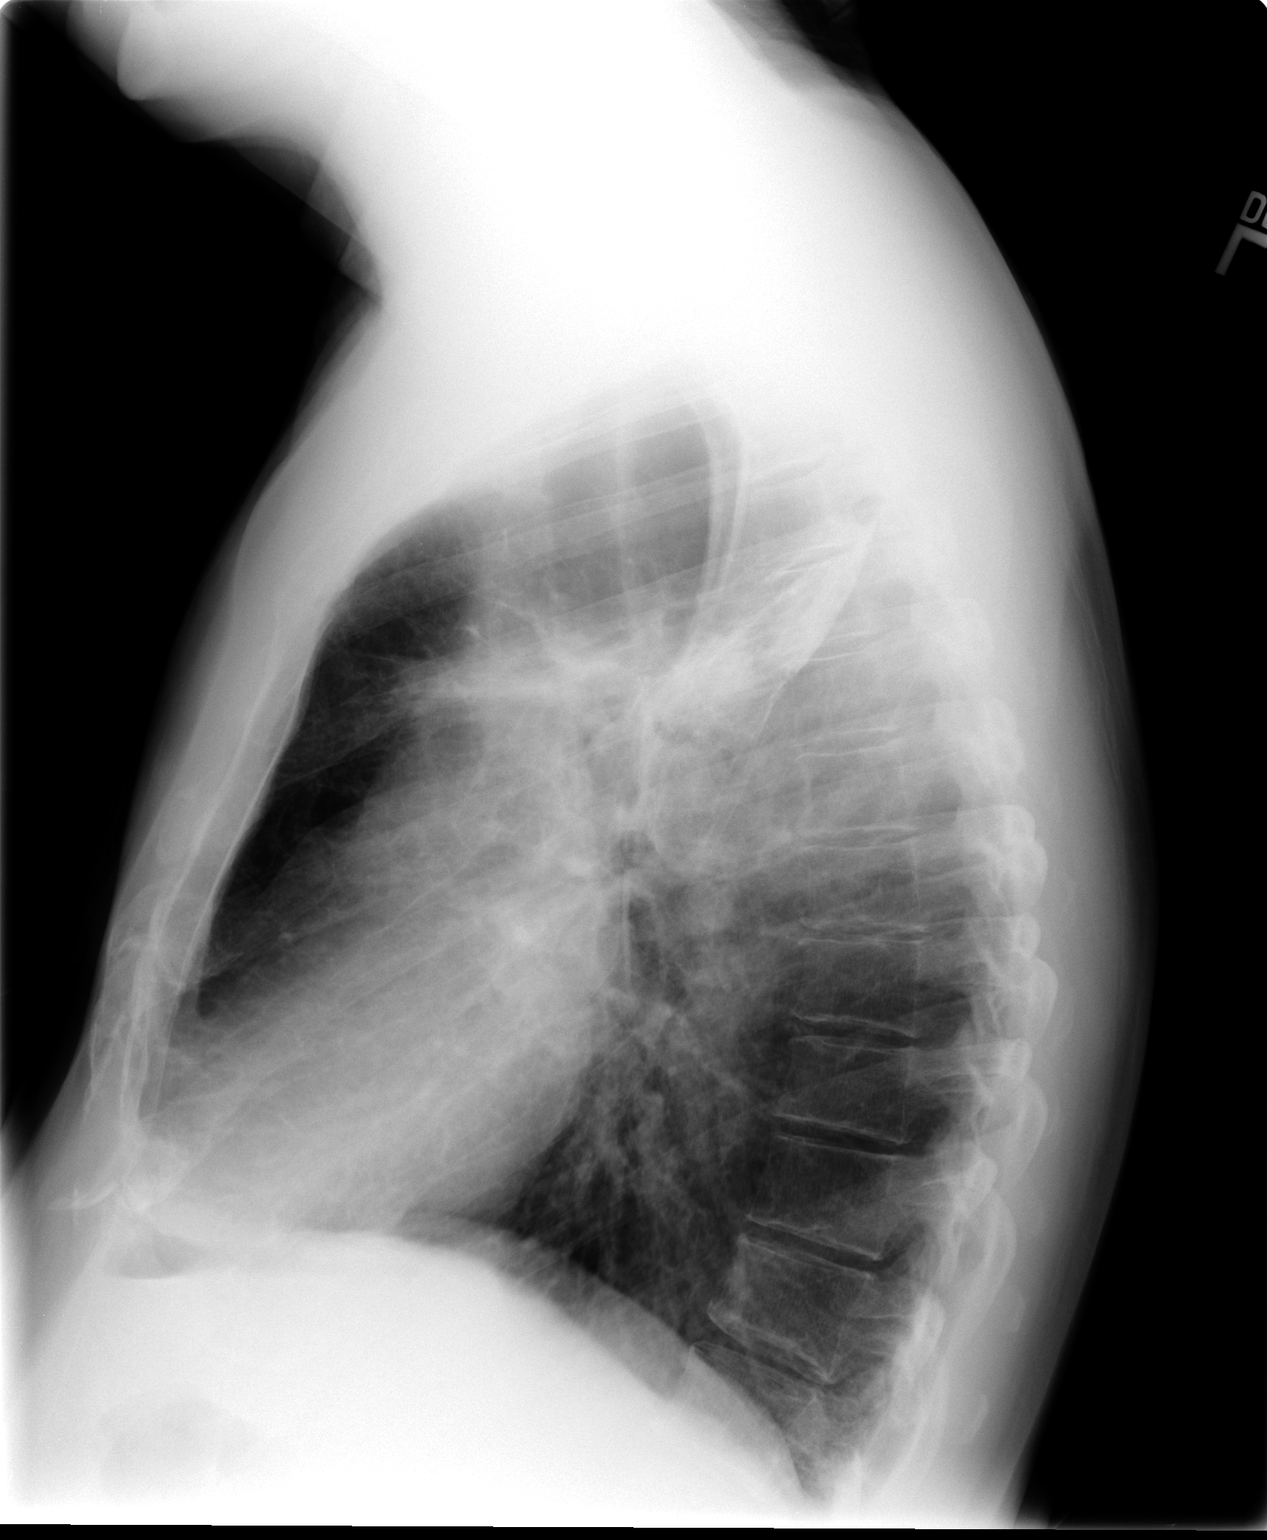

[2 of 2 positions shown; findings below may reference images not displayed]

FINDINGS: Irregular mass-like opacity in the left upper lobe shows
no significant change.  Mild left hilar adenopathy also appears
stable.  No evidence of pneumothorax or pleural effusion.  Right
lung is clear.  Pulmonary emphysema again noted.  Heart size is
normal.
IMPRESSION: 1.  Stable irregular mass-like opacity in the peripheral left upper
lobe, and mild left hilar lymphadenopathy.
2.  Pulmonary emphysema.

## 2012-10-04 ENCOUNTER — Ambulatory Visit (INDEPENDENT_AMBULATORY_CARE_PROVIDER_SITE_OTHER): Payer: Medicare Other | Admitting: Physician Assistant

## 2012-10-04 ENCOUNTER — Encounter: Payer: Self-pay | Admitting: Physician Assistant

## 2012-10-04 VITALS — BP 122/92 | HR 80 | Temp 98.0°F | Resp 18 | Ht 65.5 in | Wt 140.0 lb

## 2012-10-04 DIAGNOSIS — J441 Chronic obstructive pulmonary disease with (acute) exacerbation: Secondary | ICD-10-CM | POA: Diagnosis not present

## 2012-10-04 DIAGNOSIS — J449 Chronic obstructive pulmonary disease, unspecified: Secondary | ICD-10-CM

## 2012-10-04 MED ORDER — LEVOFLOXACIN 750 MG PO TABS: 750.0000 mg | ORAL_TABLET | Freq: Every day | ORAL | Status: DC

## 2012-10-04 NOTE — Progress Notes (Signed)
Patient ID: Ryan [name] MRN: [id number], DOB: 09-16-1960, 52 y.o. Date of Encounter: @DATE @  Chief Complaint:  Chief Complaint  Patient presents with  . c/o breathing problems unresolved  . c/o right side pain  . post ER visit  had appt all on snow days    HPI: 52 y.o. year old male  presents for post hospital f/u. Records were reviewed in [facility]. He went to ER 06/26/12.Had CXRay, CT Chest, CT Abd/Pelvis. At that time he was having cough with phlegm. Was treated with ZPack. He completed this. Says he was a little better while on the medicine but since he has been off abx, has gradually worsened again. Now with phlegm productive of phlegm. Says at his normal baseline, he sometimes has some phlegm but it is clear. Says this phlegm is dark and thick, worse than his baseline.     Past Medical History  Diagnosis Date  . Emphysema   . COPD (chronic obstructive pulmonary disease)   . Hypercholesteremia   . Pneumonia   . Polycythemia   . Coronary artery disease   . Cavitary lesion of lung 05/07/2011  . Borderline diabetes   . Seizures     last seizure 2 yrs ago  . GERD (gastroesophageal reflux disease)   . Diabetes mellitus   . 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 Chronic back pain      Home Meds: Current Outpatient Prescriptions on File Prior to Visit  Medication Sig Dispense Refill  . albuterol (VENTOLIN HFA) 108 (90 BASE) MCG/ACT inhaler Inhale 2 puffs into the lungs every 4 (four) hours as needed. Shortness of breath      . aspirin EC 81 MG tablet Take 81 mg by mouth daily.        . Fluticasone-Salmeterol (ADVAIR DISKUS) 250-50 MCG/DOSE AEPB Inhale 1 puff into the lungs every 12 (twelve) hours.       [name] omeprazole (PRILOSEC) 20 MG capsule Take 20 mg by mouth daily.      [name] tiotropium (SPIRIVA) 18 MCG inhalation capsule Place 18 mcg into inhaler and inhale daily.      [name] atorvastatin (LIPITOR) 20 MG tablet Take 20 mg by mouth at bedtime.       [name] buPROPion  (WELLBUTRIN XL) 300 MG 24 hr tablet Take 300 mg by mouth daily.      [name] HYDROcodone-acetaminophen (NORCO/VICODIN) 5-325 MG per tablet Take 2 tablets by mouth every 4 (four) hours as needed for pain.  10 tablet  0   No current facility-administered medications on file prior to visit.    Allergies:  Allergies  Allergen Reactions  . Influenza Vaccine Live Swelling    History   Social History  . Marital Status: Married    Spouse Name: N/A    Number of Children: N/A  . Years of Education: 10   Occupational History  . Not on file.   Social History Main Topics  . Smoking status: Current Every Day Smoker -- 1.00 packs/day for 45 years    Types: Cigarettes  . Smokeless tobacco: Former Neurosurgeon  . Alcohol Use: No  . Drug Use: Yes    Special: Marijuana  . Sexually Active: Yes    Birth Control/ Protection: Surgical   Other Topics Concern  . Not on file   Social History Narrative  . No narrative on file    Family History  Problem Relation Age of Onset  . Hypertension Mother   .  Diabetes Mother   . Heart attack Mother   . Heart failure Mother   . Hypertension Sister   . Diabetes Sister   . Heart failure Sister      Review of Systems: Constitutional: negative for chills, fever, night sweats, weight changes, or fatigue  HEENT: negative for vision changes, hearing loss, congestion, rhinorrhea, ST, epistaxis, or sinus pressure Cardiovascular: negative for chest pain or palpitations.  Respiratory: negative for hemoptysis. See HPI for positives.  Abdominal: Still with some  abdominal pain, which is the same as the pain he had when at ER and CT was negative. Pain has not worsened or changed. No  nausea, vomiting, diarrhea, or constipation Dermatological: negative for rash or concerning skin lesions Neurologic: negative for headache, dizziness, or syncope All other systems reviewed and are otherwise negative with the exception to those above and in the HPI.   Physical Exam: Blood  pressure 122/92, pulse 80, temperature 98 F (36.7 C), resp. rate 18, height 5' 5.5" (1.664 m), weight 140 lb (63.504 kg), SpO2 95.00%., Body mass index is 22.93 kg/(m^2). General: Somewhat Thin WM but Well developed, well nourished, in no acute distress. Head: Normocephalic, atraumatic, eyes without discharge, sclera non-icteric, nares are without discharge. Bilateral auditory canals clear, TM's are without perforation, pearly grey and translucent with reflective cone of light bilaterally. Oral cavity moist, posterior pharynx without exudate, erythema, peritonsillar abscess, or post nasal drip.  Neck: Supple. No thyromegaly. Full ROM. No lymphadenopathy. Lungs:  Distant/Decreased Breath sounds throughout but without wheezes, rales, or rhonchi. Breathing is unlabored. Heart: RRR with S1 S2. No murmurs, rubs, or gallops. Abdomen: Soft, non-tender, non-distended with normoactive bowel sounds. No hepatomegaly. No rebound/guarding. No obvious abdominal masses. Min tenderness with palpation of periumbilical region but pt says this is the same pain he had when at ER. Musculoskeletal:  Strength and tone normal for age. Extremities/Skin: Warm and dry. No cyanosis. No edema. No rashes or suspicious lesions. Neuro: Alert and oriented X 3. Moves all extremities spontaneously. Gait is normal. CNII-XII grossly in tact. Psych:  Responds to questions appropriately with a normal affect.     ASSESSMENT AND PLAN:  52 y.o. year old male with  1. Obstructive chronic bronchitis with exacerbation - levofloxacin (LEVAQUIN) 750 MG tablet; Take 1 tablet (750 mg total) by mouth daily.  Dispense: 10 tablet; Refill: 0 If increased, thick phlegm does not resolve, F/U.  2. COPD, severe 3. Time was spent reviewing records of: ER Note 06/26/12, including CXR, Chest CT, Abd/Pelvis CT reports 09/07/12 CT Chest Report 09/07/12 OV with Dr. 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 Appears stable on current CT.  H/O Bronch and biopsy Nov 2012. Plan to f/u until 2 years post bronc (Nov 2014).   [address] [place], [place], [facility] 10/04/2012 4:58 PM

## 2012-10-27 ENCOUNTER — Emergency Department (HOSPITAL_COMMUNITY): Payer: Medicare Other

## 2012-10-27 ENCOUNTER — Emergency Department (HOSPITAL_COMMUNITY)
Admission: EM | Admit: 2012-10-27 | Discharge: 2012-10-27 | Disposition: A | Discharge status: Home / Self Care | Payer: Medicare Other | Attending: Emergency Medicine | Admitting: Emergency Medicine

## 2012-10-27 ENCOUNTER — Encounter (HOSPITAL_COMMUNITY): Payer: Self-pay | Admitting: Emergency Medicine

## 2012-10-27 DIAGNOSIS — Z8701 Personal history of pneumonia (recurrent): Secondary | ICD-10-CM | POA: Insufficient documentation

## 2012-10-27 DIAGNOSIS — F17200 Nicotine dependence, unspecified, uncomplicated: Secondary | ICD-10-CM | POA: Diagnosis not present

## 2012-10-27 DIAGNOSIS — G8929 Other chronic pain: Secondary | ICD-10-CM | POA: Insufficient documentation

## 2012-10-27 DIAGNOSIS — R51 Headache: Secondary | ICD-10-CM | POA: Diagnosis not present

## 2012-10-27 DIAGNOSIS — K219 Gastro-esophageal reflux disease without esophagitis: Secondary | ICD-10-CM | POA: Diagnosis not present

## 2012-10-27 DIAGNOSIS — Z8709 Personal history of other diseases of the respiratory system: Secondary | ICD-10-CM | POA: Insufficient documentation

## 2012-10-27 DIAGNOSIS — E119 Type 2 diabetes mellitus without complications: Secondary | ICD-10-CM | POA: Diagnosis not present

## 2012-10-27 DIAGNOSIS — R109 Unspecified abdominal pain: Secondary | ICD-10-CM | POA: Diagnosis not present

## 2012-10-27 DIAGNOSIS — J40 Bronchitis, not specified as acute or chronic: Secondary | ICD-10-CM

## 2012-10-27 DIAGNOSIS — Z9889 Other specified postprocedural states: Secondary | ICD-10-CM | POA: Diagnosis not present

## 2012-10-27 DIAGNOSIS — C3490 Malignant neoplasm of unspecified part of unspecified bronchus or lung: Secondary | ICD-10-CM | POA: Diagnosis not present

## 2012-10-27 DIAGNOSIS — J449 Chronic obstructive pulmonary disease, unspecified: Secondary | ICD-10-CM

## 2012-10-27 DIAGNOSIS — R059 Cough, unspecified: Secondary | ICD-10-CM | POA: Insufficient documentation

## 2012-10-27 DIAGNOSIS — Z7982 Long term (current) use of aspirin: Secondary | ICD-10-CM | POA: Insufficient documentation

## 2012-10-27 DIAGNOSIS — G40909 Epilepsy, unspecified, not intractable, without status epilepticus: Secondary | ICD-10-CM | POA: Insufficient documentation

## 2012-10-27 DIAGNOSIS — Z79899 Other long term (current) drug therapy: Secondary | ICD-10-CM | POA: Insufficient documentation

## 2012-10-27 DIAGNOSIS — Z862 Personal history of diseases of the blood and blood-forming organs and certain disorders involving the immune mechanism: Secondary | ICD-10-CM | POA: Insufficient documentation

## 2012-10-27 DIAGNOSIS — M549 Dorsalgia, unspecified: Secondary | ICD-10-CM | POA: Insufficient documentation

## 2012-10-27 DIAGNOSIS — J441 Chronic obstructive pulmonary disease with (acute) exacerbation: Secondary | ICD-10-CM | POA: Insufficient documentation

## 2012-10-27 DIAGNOSIS — I2510 Atherosclerotic heart disease of native coronary artery without angina pectoris: Secondary | ICD-10-CM | POA: Diagnosis not present

## 2012-10-27 DIAGNOSIS — J440 Chronic obstructive pulmonary disease with acute lower respiratory infection: Secondary | ICD-10-CM | POA: Diagnosis not present

## 2012-10-27 DIAGNOSIS — E7800 Pure hypercholesterolemia, unspecified: Secondary | ICD-10-CM | POA: Diagnosis not present

## 2012-10-27 DIAGNOSIS — J3489 Other specified disorders of nose and nasal sinuses: Secondary | ICD-10-CM | POA: Diagnosis not present

## 2012-10-27 DIAGNOSIS — Z8739 Personal history of other diseases of the musculoskeletal system and connective tissue: Secondary | ICD-10-CM | POA: Insufficient documentation

## 2012-10-27 DIAGNOSIS — R05 Cough: Secondary | ICD-10-CM | POA: Insufficient documentation

## 2012-10-27 LAB — CBC WITH DIFFERENTIAL/PLATELET
Eosinophils Relative: 2 % (ref 0–5)
HCT: 46.1 % (ref 39.0–52.0)
Hemoglobin: 16 g/dL (ref 13.0–17.0)
Lymphocytes Relative: 27 % (ref 12–46)
MCV: 83.7 fL (ref 78.0–100.0)
Monocytes Absolute: 0.9 10*3/uL (ref 0.1–1.0)
Monocytes Relative: 10 % (ref 3–12)
Neutro Abs: 5.1 10*3/uL (ref 1.7–7.7)
WBC: 8.5 10*3/uL (ref 4.0–10.5)

## 2012-10-27 LAB — COMPREHENSIVE METABOLIC PANEL
BUN: 19 mg/dL (ref 6–23)
CO2: 26 mEq/L (ref 19–32)
Chloride: 98 mEq/L (ref 96–112)
Creatinine, Ser: 1.11 mg/dL (ref 0.50–1.35)
GFR calc Af Amer: 86 mL/min — ABNORMAL LOW (ref >90)
GFR calc non Af Amer: 74 mL/min — ABNORMAL LOW (ref >90)
Glucose, Bld: 114 mg/dL — ABNORMAL HIGH (ref 70–99)
Total Bilirubin: 0.2 mg/dL — ABNORMAL LOW (ref 0.3–1.2)

## 2012-10-27 MED ORDER — IPRATROPIUM BROMIDE 0.02 % IN SOLN
0.5000 mg | Freq: Once | RESPIRATORY_TRACT | Status: AC
  Administered 2012-10-27: 0.5 mg via RESPIRATORY_TRACT
  Filled 2012-10-27: qty 2.5

## 2012-10-27 MED ORDER — METHYLPREDNISOLONE SODIUM SUCC 125 MG IJ SOLR
125.0000 mg | Freq: Once | INTRAMUSCULAR | Status: AC
  Administered 2012-10-27: 125 mg via INTRAVENOUS
  Filled 2012-10-27: qty 2

## 2012-10-27 MED ORDER — OXYCODONE-ACETAMINOPHEN 5-325 MG PO TABS: 1.0000 | ORAL_TABLET | Freq: Four times a day (QID) | ORAL | Status: DC | PRN

## 2012-10-27 MED ORDER — PREDNISONE 10 MG PO TABS: 20.0000 mg | ORAL_TABLET | Freq: Every day | ORAL | Status: DC

## 2012-10-27 MED ORDER — AMOXICILLIN 500 MG PO CAPS: 500.0000 mg | ORAL_CAPSULE | Freq: Three times a day (TID) | ORAL | Status: DC

## 2012-10-27 MED ORDER — OXYCODONE-ACETAMINOPHEN 5-325 MG PO TABS
1.0000 | ORAL_TABLET | Freq: Once | ORAL | Status: AC
  Administered 2012-10-27: 1 via ORAL
  Filled 2012-10-27: qty 1

## 2012-10-27 MED ORDER — ALBUTEROL SULFATE (5 MG/ML) 0.5% IN NEBU
5.0000 mg | INHALATION_SOLUTION | Freq: Once | RESPIRATORY_TRACT | Status: AC
  Administered 2012-10-27: 5 mg via RESPIRATORY_TRACT
  Filled 2012-10-27: qty 1

## 2012-10-27 NOTE — ED Provider Notes (Signed)
History    This chart was scribed for Ryan Lennert, MD by Marlyne Beards, ED Scribe. The patient was seen in room APA05/APA05. Patient's care was started at 9:28 PM.    CSN: [id number]  Arrival date & time 10/27/12  2116   First MD Initiated Contact with Patient 10/27/12 2128      Chief Complaint  Patient presents with  . Shortness of Breath  . Cough  . Nasal Congestion    (Consider location/radiation/quality/duration/timing/severity/associated sxs/prior treatment) Patient is a 52 y.o. male presenting with shortness of breath and cough. The history is provided by the patient. No language interpreter was used.  Shortness of Breath Severity:  Moderate Onset quality:  Gradual Timing:  Constant Progression:  Waxing and waning Associated symptoms: abdominal pain, cough and headaches   Associated symptoms: no chest pain and no rash   Cough Associated symptoms: headaches, rhinorrhea and shortness of breath   Associated symptoms: no chest pain, no eye discharge and no rash    Ryan [name] is a 52 y.o. male with h/o COPD who presents to the Emergency Department complaining of moderate constant SOB with associated cough and rhinorrhea the past two days. Pt states that he has a productive cough with yellow sputum. Pt states that he hurts on his right abdominal side when he coughs. Pt states his cough has caused a headache. Pt denies fever, chills, nausea, vomiting, diarrhea, weakness, and any other associated symptoms. Pt's PCP is Dr. Durwin Nora.   Past Medical History  Diagnosis Date  . Emphysema   . COPD (chronic obstructive pulmonary disease)   . Hypercholesteremia   . Pneumonia   . Polycythemia   . Coronary artery disease   . Cavitary lesion of lung 05/07/2011  . Borderline diabetes   . Seizures     last seizure 2 yrs ago  . GERD (gastroesophageal reflux disease)   . Diabetes mellitus   . 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 Chronic back pain     Past  Surgical History  Procedure Laterality Date  . Angioplasty    . Colonoscopy w/ endoscopic [place]    . Vasectomy    . Throat biopsy    . Lung biopsy    . Vasectomy  1987  . Cardiac surgery      Family History  Problem Relation Age of Onset  . Hypertension Mother   . Diabetes Mother   . Heart attack Mother   . Heart failure Mother   . Hypertension Sister   . Diabetes Sister   . Heart failure Sister     History  Substance Use Topics  . Smoking status: Current Every Day Smoker -- 1.00 packs/day for 45 years    Types: Cigarettes  . Smokeless tobacco: Former Neurosurgeon  . Alcohol Use: No      Review of Systems  Constitutional: Negative for appetite change and fatigue.  HENT: Positive for congestion and rhinorrhea. Negative for sinus pressure and ear discharge.   Eyes: Negative for discharge.  Respiratory: Positive for cough and shortness of breath.   Cardiovascular: Negative for chest pain.  Gastrointestinal: Positive for abdominal pain. Negative for diarrhea.  Genitourinary: Negative for frequency and hematuria.  Musculoskeletal: Negative for back pain.  Skin: Negative for rash.  Neurological: Positive for headaches. Negative for seizures.  Psychiatric/Behavioral: Negative for hallucinations.    Allergies  Influenza vaccine live  Home Medications   Current Outpatient Rx  Name  Route  Sig  Dispense  Refill  . albuterol (VENTOLIN HFA) 108 (90 BASE) MCG/ACT inhaler   Inhalation   Inhale 2 puffs into the lungs every 4 (four) hours as needed. Shortness of breath         . aspirin EC 81 MG tablet   Oral   Take 81 mg by mouth daily.           [name] atorvastatin (LIPITOR) 20 MG tablet   Oral   Take 20 mg by mouth at bedtime.          [name] buPROPion (WELLBUTRIN XL) 300 MG 24 hr tablet   Oral   Take 300 mg by mouth daily.         . clonazePAM (KLONOPIN) 1 MG tablet   Oral   Take 1 mg by mouth at bedtime as needed for anxiety.         . Fluticasone-Salmeterol (ADVAIR  DISKUS) 250-50 MCG/DOSE AEPB   Inhalation   Inhale 1 puff into the lungs every 12 (twelve) hours.          [name] HYDROcodone-acetaminophen (NORCO/VICODIN) 5-325 MG per tablet   Oral   Take 2 tablets by mouth every 4 (four) hours as needed for pain.   10 tablet   0   . levofloxacin (LEVAQUIN) 750 MG tablet   Oral   Take 1 tablet (750 mg total) by mouth daily.   10 tablet   0   . omeprazole (PRILOSEC) 20 MG capsule   Oral   Take 20 mg by mouth daily.         [name] tiotropium (SPIRIVA) 18 MCG inhalation capsule   Inhalation   Place 18 mcg into inhaler and inhale daily.           BP 118/78  Pulse 99  Temp(Src) 97.1 F (36.2 C) (Oral)  Resp 20  Ht 5\' 8"  (1.727 m)  Wt 140 lb (63.504 kg)  BMI 21.29 kg/m2  SpO2 94%  Physical Exam  Nursing note and vitals reviewed. Constitutional: He is oriented to person, place, and time. He appears well-developed.  HENT:  Head: Normocephalic.  Eyes: Conjunctivae and EOM are normal. No scleral icterus.  Neck: Neck supple. No thyromegaly present.  Cardiovascular: Normal rate and regular rhythm.  Exam reveals no gallop and no friction rub.   No murmur heard. Pulmonary/Chest: No stridor. He has wheezes. He has no rales. He exhibits no tenderness.  Mild wheezing bilaterally.  Abdominal: He exhibits no distension. There is no tenderness. There is no rebound.  Musculoskeletal: Normal range of motion. He exhibits no edema.  Lymphadenopathy:    He has no cervical adenopathy.  Neurological: He is oriented to person, place, and time. Coordination normal.  Skin: No rash noted. No erythema.  Psychiatric: He has a normal mood and affect. His behavior is normal.    ED Course  Procedures (including critical care time) DIAGNOSTIC STUDIES: Oxygen Saturation is 94% on room air, adequate by my interpretation.    COORDINATION OF CARE: 9:34 PM Discussed ED treatment with pt and pt agrees.  10:29 PM Discussed lab and xray results with pt and pt agrees.  Pt states that breathing treatment helped . Pt takes Spiriva and Advare once a day.   Labs Reviewed  COMPREHENSIVE METABOLIC PANEL - Abnormal; Notable for the following:    Glucose, Bld 114 (*)    Albumin 3.4 (*)    Total Bilirubin 0.2 (*)    GFR calc non Af Amer 74 (*)  GFR calc Af Amer 86 (*)    All other components within normal limits  CBC WITH DIFFERENTIAL   Dg Chest Port 1 View  10/27/2012  *RADIOLOGY REPORT*  Clinical Data: Shortness of breath.  COPD.  Lung cancer.  PORTABLE CHEST - 1 VIEW  Comparison: 08/29/2012  Findings: The cavitary lesion in the left upper lobe is less distinct.  There is persistent fullness of the left hilum consistent with adenopathy seen on prior exams.  Diffuse chronic interstitial and obstructive lung disease, unchanged.  Heart size and pulmonary vascularity are unchanged.  No acute osseous abnormality.  IMPRESSION: Cavitary lesion in the left upper lobe is slightly less distinct. No other change.  Severe chronic lung disease.   Original Report Authenticated By: Francene Boyers, M.D.      No diagnosis found.    MDM   The chart was scribed for me under my direct supervision.  I personally performed the history, physical, and medical decision making and all procedures in the evaluation of this patient.Ryan Lennert, MD 10/27/12 [phone]

## 2012-10-27 NOTE — ED Notes (Addendum)
Pt with sob x 4-5 days with productive cough of yellow/ green phlegm, hx of PNA, wears home O2 at 2L/M, c/o chills as well

## 2012-10-27 NOTE — ED Notes (Signed)
Pt c/o increased sob, cough and runny nose x 2 days.

## 2012-11-13 ENCOUNTER — Emergency Department (INDEPENDENT_AMBULATORY_CARE_PROVIDER_SITE_OTHER)
Admission: EM | Admit: 2012-11-13 | Discharge: 2012-11-13 | Disposition: A | Discharge status: Home / Self Care | Payer: Medicare Other | Source: Home / Self Care | Attending: Family Medicine | Admitting: Family Medicine

## 2012-11-13 ENCOUNTER — Encounter (HOSPITAL_COMMUNITY): Payer: Self-pay | Admitting: Emergency Medicine

## 2012-11-13 ENCOUNTER — Emergency Department (INDEPENDENT_AMBULATORY_CARE_PROVIDER_SITE_OTHER): Payer: Medicare Other

## 2012-11-13 DIAGNOSIS — J984 Other disorders of lung: Secondary | ICD-10-CM | POA: Diagnosis not present

## 2012-11-13 DIAGNOSIS — R1012 Left upper quadrant pain: Secondary | ICD-10-CM

## 2012-11-13 DIAGNOSIS — R079 Chest pain, unspecified: Secondary | ICD-10-CM | POA: Diagnosis not present

## 2012-11-13 DIAGNOSIS — R0781 Pleurodynia: Secondary | ICD-10-CM

## 2012-11-13 HISTORY — DX: Bronchitis, not specified as acute or chronic: J40

## 2012-11-13 MED ORDER — BENZONATATE 100 MG PO CAPS: 100.0000 mg | ORAL_CAPSULE | Freq: Three times a day (TID) | ORAL | Status: DC

## 2012-11-13 MED ORDER — GUAIFENESIN-CODEINE 100-10 MG/5ML PO SYRP: 5.0000 mL | ORAL_SOLUTION | Freq: Three times a day (TID) | ORAL | Status: DC | PRN

## 2012-11-13 MED ORDER — TRAMADOL HCL 50 MG PO TABS: 50.0000 mg | ORAL_TABLET | Freq: Four times a day (QID) | ORAL | Status: DC | PRN

## 2012-11-13 NOTE — ED Notes (Signed)
Patient transported to X-ray 

## 2012-11-13 NOTE — ED Notes (Signed)
Patient states "lung pain" hurts with coughing and with deep breathing.  Patient is on o2 at home.  Has been on o2 for a year.  Touches upper left abdomen/lower left rib cage as location of pain.  Onset 3 days ago.  Unable to get into pcp office.  Patient reports this pain is the same as he has had before and told at that time " emphysema acting up".  Patient was recently treated for bronchitis by [facility].

## 2012-11-13 NOTE — ED Notes (Signed)
Delay in discharging patient-assessing patient in waiting room

## 2012-11-13 NOTE — ED Provider Notes (Signed)
History     CSN: [id number]  Arrival date & time 11/13/12  1133   First MD Initiated Contact with Patient 11/13/12 1253      Chief Complaint  Patient presents with  . Chest Pain  . Abdominal Pain    (Consider location/radiation/quality/duration/timing/severity/associated sxs/prior treatment) HPI Comments: 52 year old smoker male with history of COPD and known cavitary lesions of his lungs among other chronic comorbidities. Here complaining of left lower rib pain associated with left upper quadrant abdominal pain with coughing for the last 3 days. Patient denies current shortness of breath or wheezing. He has refills for his inhalers (Advair, Spiriva and albuterol). Denies fever chills or general malaise. No changes in his sputum. Patient requesting refill on Percocet he got last time at the emergency department for similar pain. He is telling me his primary care clinic does not prescribe pain medications as a policy and he needs to get pain medications from somewere else. Denies chest pain or diaphoresis. No nausea vomiting or diarrhea. Appetite is at baseline. Normal regular bowel movements unchanged. No recent falls or direct injury to his chest   Past Medical History  Diagnosis Date  . Emphysema   . COPD (chronic obstructive pulmonary disease)   . Hypercholesteremia   . Pneumonia   . Polycythemia   . Coronary artery disease   . Cavitary lesion of lung 05/07/2011  . Borderline diabetes   . Seizures     last seizure 2 yrs ago  . GERD (gastroesophageal reflux disease)   . Diabetes mellitus   . 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 Chronic back pain   . Bronchitis     Past Surgical History  Procedure Laterality Date  . Angioplasty    . Colonoscopy w/ endoscopic [place]    . Vasectomy    . Throat biopsy    . Lung biopsy    . Vasectomy  1987  . Cardiac surgery      Family History  Problem Relation Age of Onset  . Hypertension Mother   . Diabetes  Mother   . Heart attack Mother   . Heart failure Mother   . Hypertension Sister   . Diabetes Sister   . Heart failure Sister     History  Substance Use Topics  . Smoking status: Current Every Day Smoker -- 1.00 packs/day for 45 years    Types: Cigarettes  . Smokeless tobacco: Former Neurosurgeon  . Alcohol Use: No      Review of Systems  Constitutional: Negative for fever, chills, diaphoresis, appetite change and fatigue.  Respiratory: Positive for cough. Negative for chest tightness, shortness of breath and wheezing.   Cardiovascular: Positive for chest pain.  Gastrointestinal: Positive for abdominal pain. Negative for nausea, vomiting, diarrhea and constipation.  Skin: Negative for rash.  Neurological: Negative for dizziness.  All other systems reviewed and are negative.    Allergies  Influenza vaccine live  Home Medications   Current Outpatient Rx  Name  Route  Sig  Dispense  Refill  . OVER THE COUNTER MEDICATION      Oxygen at home         . albuterol (VENTOLIN HFA) 108 (90 BASE) MCG/ACT inhaler   Inhalation   Inhale 2 puffs into the lungs every 4 (four) hours as needed. Shortness of breath         . aspirin EC 81 MG tablet   Oral   Take 81 mg by mouth as  directed.          [name] atorvastatin (LIPITOR) 20 MG tablet   Oral   Take 20 mg by mouth at bedtime.          . benzonatate (TESSALON) 100 MG capsule   Oral   Take 1 capsule (100 mg total) by mouth every 8 (eight) hours.   21 capsule   0   . clonazePAM (KLONOPIN) 1 MG tablet   Oral   Take 1 mg by mouth at bedtime as needed for anxiety.         . Fluticasone-Salmeterol (ADVAIR DISKUS) 250-50 MCG/DOSE AEPB   Inhalation   Inhale 1 puff into the lungs every 12 (twelve) hours.          [name] guaiFENesin-codeine (ROBITUSSIN AC) 100-10 MG/5ML syrup   Oral   Take 5 mLs by mouth 3 (three) times daily as needed for cough.   120 mL   0   . loratadine (CLARITIN) 10 MG tablet   Oral   Take 10 mg by mouth  daily.         [name] omeprazole (PRILOSEC) 20 MG capsule   Oral   Take 20 mg by mouth every morning.          [name] oxyCODONE-acetaminophen (PERCOCET/ROXICET) 5-325 MG per tablet   Oral   Take 1 tablet by mouth every 6 (six) hours as needed for pain.   20 tablet   0   . predniSONE (DELTASONE) 10 MG tablet   Oral   Take 2 tablets (20 mg total) by mouth daily.   15 tablet   0   . tiotropium (SPIRIVA) 18 MCG inhalation capsule   Inhalation   Place 18 mcg into inhaler and inhale daily.         . traMADol (ULTRAM) 50 MG tablet   Oral   Take 1 tablet (50 mg total) by mouth every 6 (six) hours as needed for pain.   15 tablet   0     BP 135/73  Pulse 92  Temp(Src) 98.4 F (36.9 C) (Oral)  Resp 16  SpO2 94%  Physical Exam  Nursing note and vitals reviewed. Constitutional: He is oriented to person, place, and time. He appears well-developed and well-nourished. No distress.  HENT:  Head: Normocephalic and atraumatic.  Mouth/Throat: Oropharynx is clear and moist.  Eyes: Conjunctivae are normal. No scleral icterus.  Neck: No JVD present.  Cardiovascular: Normal heart sounds.   Pulmonary/Chest: Effort normal and breath sounds normal. No respiratory distress. He has no wheezes. He has no rales.  Tenderness to palpation over left lower rib cage border. No skin bruising or hematomas associated. No crepitus or deformity palpated.  Abdominal: Soft. He exhibits no distension and no mass. There is no tenderness. There is no rebound and no guarding.  No hepato- or splenomegaly  Neurological: He is alert and oriented to person, place, and time.  Skin: No rash noted. He is not diaphoretic.    ED Course  Procedures (including critical care time)  Labs Reviewed - No data to display Dg Chest 2 View  11/13/2012  *RADIOLOGY REPORT*  Clinical Data: Cough and history of emphysema.  CHEST - 2 VIEW  Comparison: 10/17/2012 and CT from 09/07/2012  Findings: Again noted is hyperinflation and  emphysematous changes. There are persistent densities in the left upper lung and the findings are compatible with the known cavitary lesion.  The densities in the left upper lung have not significantly changed. Stable appearance  of the heart and mediastinum.  IMPRESSION: Minimal change in the left upper lobe densities. Findings are compatible with the known cavitary lesion.  No significant change since the previous examination.   Original Report Authenticated By: Richarda Overlie, M.D.      1. Pain, abdominal, LUQ   2. Rib pain on left side     EKG: Normal sinus rhythm. Ventricular rate 70 beats per minutes. No acute ischemic changes.  MDM  Impress strain of the diaphragmatic muscle from coughing. No currently in respiratory distress. No wheezing on current lung examination.Treated cough with Tessalon Perles, guaifenesin/codeine and tramadol for rib cage pain. Recommended to followup with his primary care provider. Supportive care and red flags that should prompt his return to medical attention discussed with patient and provided in writing.        Sharin Grave, MD 11/13/12 1410

## 2012-11-13 NOTE — ED Notes (Signed)
No instructions currently

## 2012-11-19 IMAGING — CT CT CHEST W/O CM
4 of 7 series · 18 of 30 positions shown, 19 images · non-contrast
Comparison: CT 05/19/2011, chest x-ray 07/16/2011

CLINICAL DATA: Cavitary pulmonary lesion.

CT CHEST WITHOUT CONTRAST
TECHNIQUE: Multidetector CT imaging of the chest was performed
following the standard protocol without IV contrast.

[Series 3: chest w/o · axial · non-contrast · 0.62mm/px · z∈[-304,-134]mm · 3 of 68 slices shown, 4 images]
[im 17/68  mediastinal]
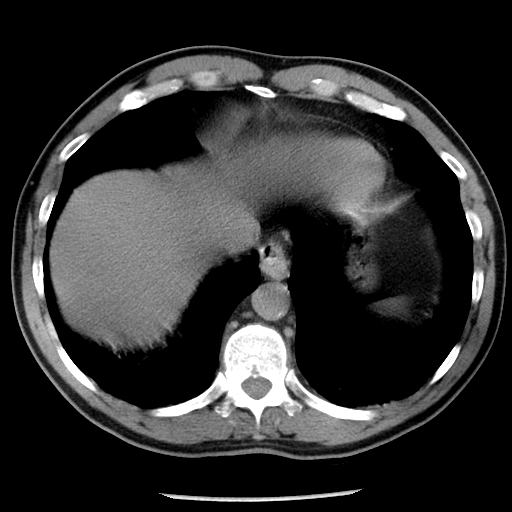
[im 17/68  lung]
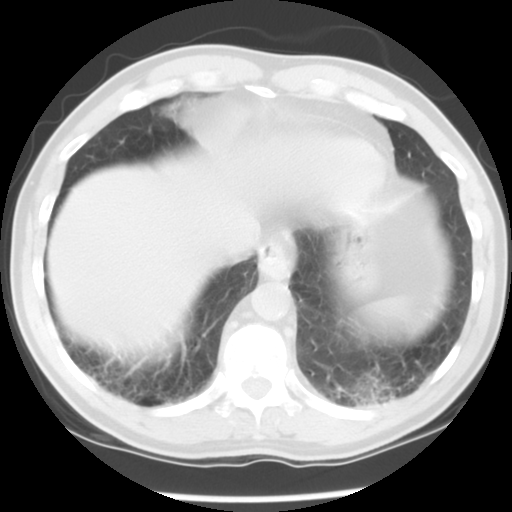
[im 34/68  lung]
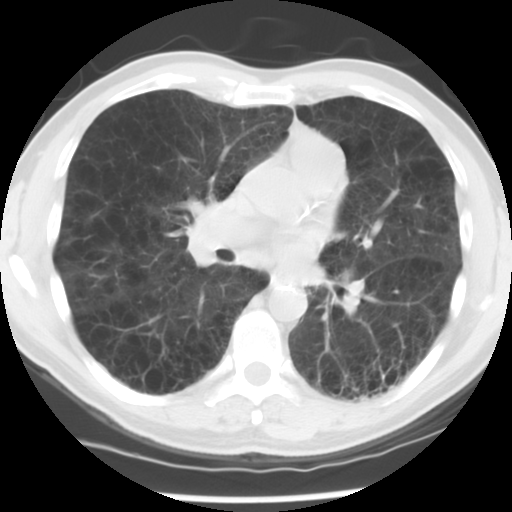
[im 51/68  lung]
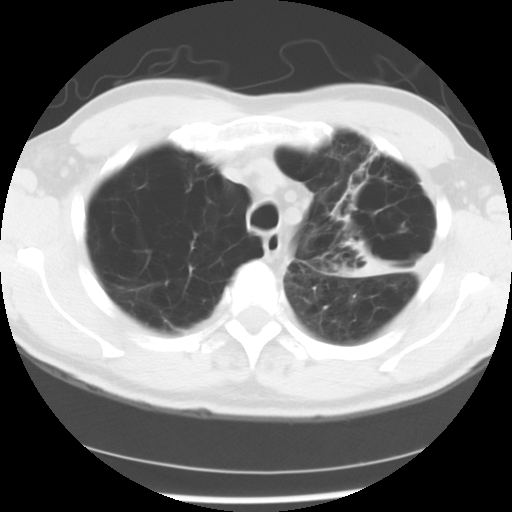

[Series 4: lung windows · axial · 0.62mm/px · z∈[-304,-134]mm · 3 of 68 slices shown]
[im 17/68  lung]
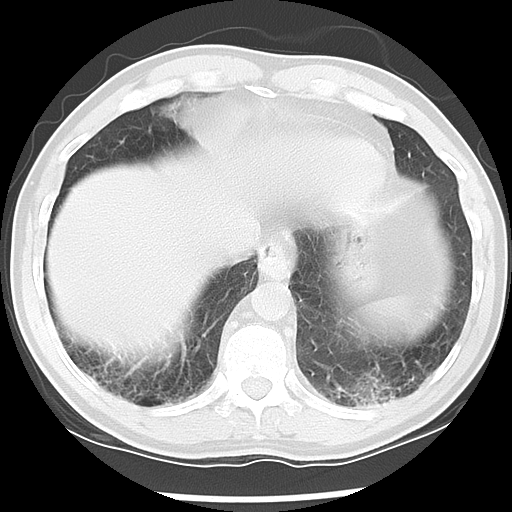
[im 34/68  lung]
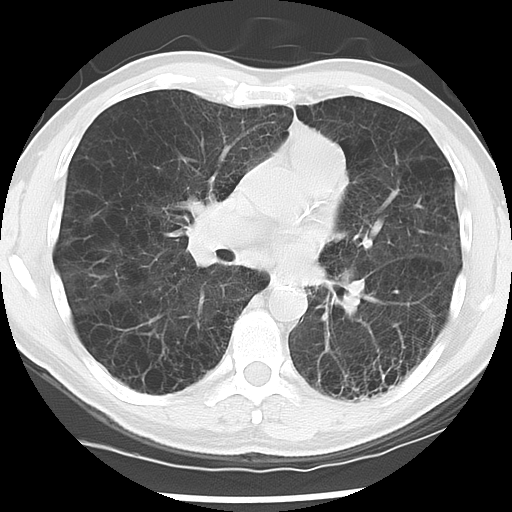
[im 51/68  lung]
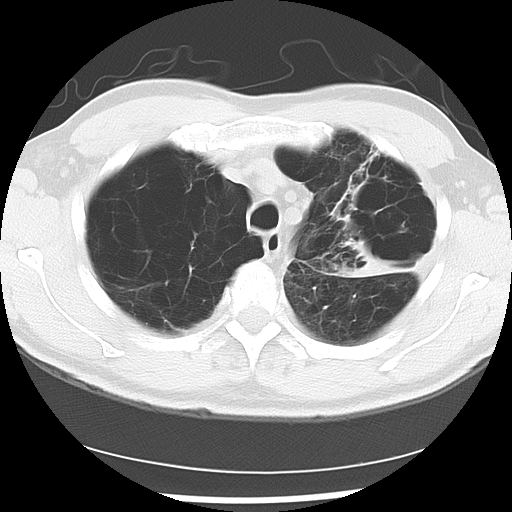

[Series 602: sagittal body · sagittal · 0.66mm/px · 6 of 129 slices shown (1 of 2)]
[im 19/129  mediastinal]
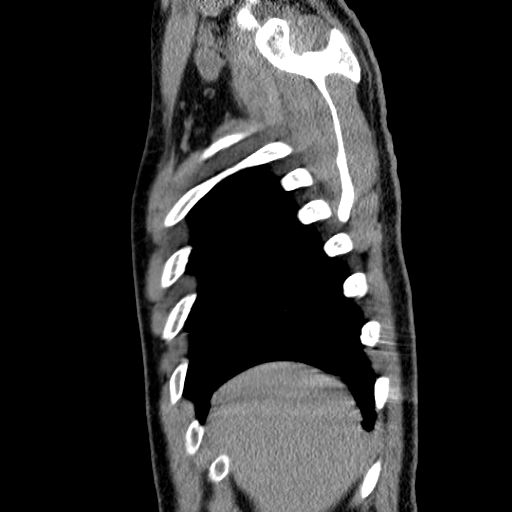
[im 37/129  mediastinal]
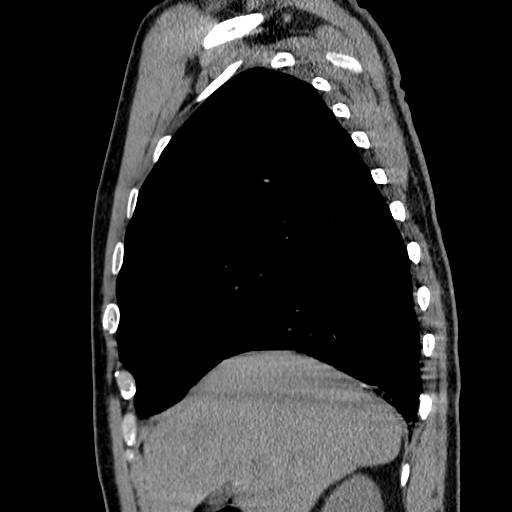
[im 55/129  mediastinal]
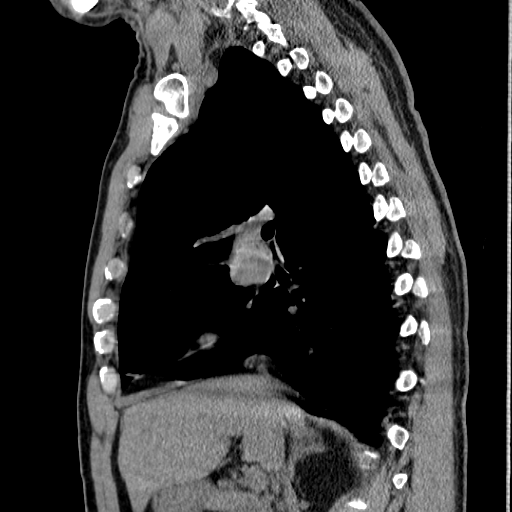
[im 74/129  mediastinal]
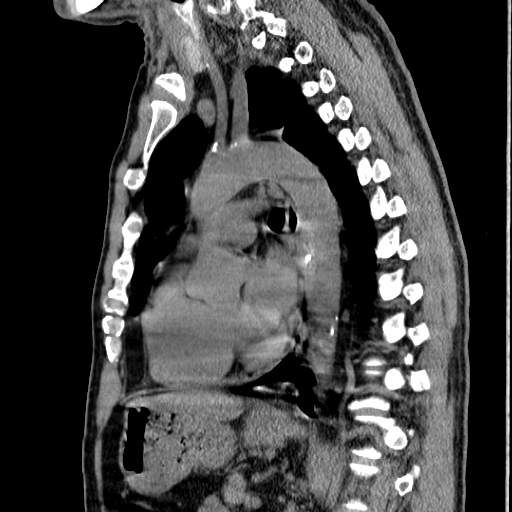
[im 92/129  mediastinal]
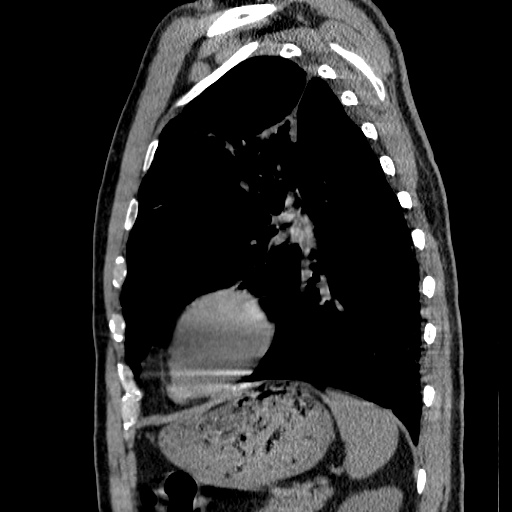
[im 110/129  mediastinal]
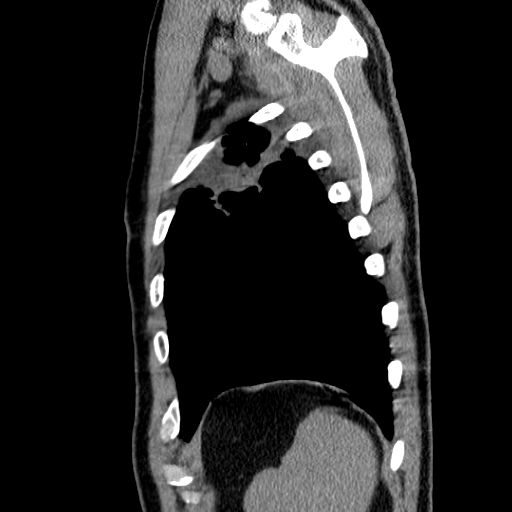

[Series 605: sagittal body · sagittal · 0.62mm/px · 6 of 129 slices shown (2 of 2)]
[im 19/129  mediastinal]
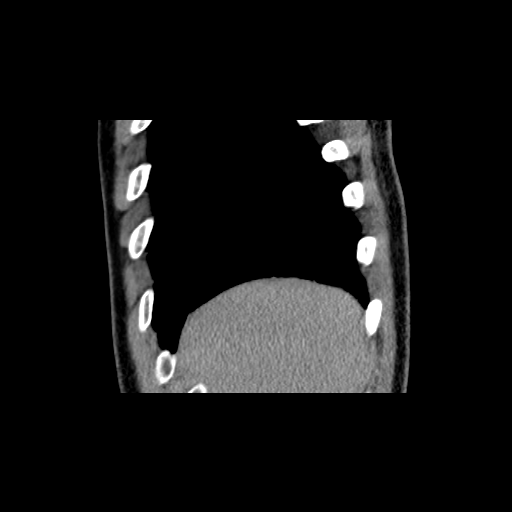
[im 37/129  mediastinal]
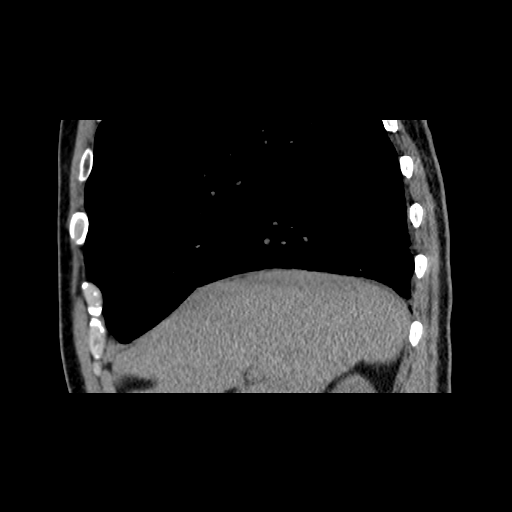
[im 55/129  mediastinal]
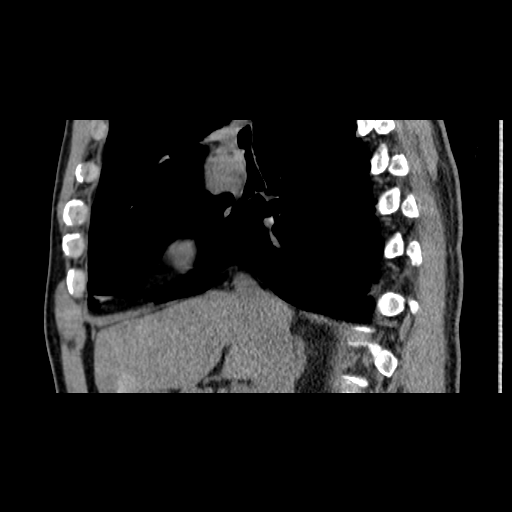
[im 74/129  mediastinal]
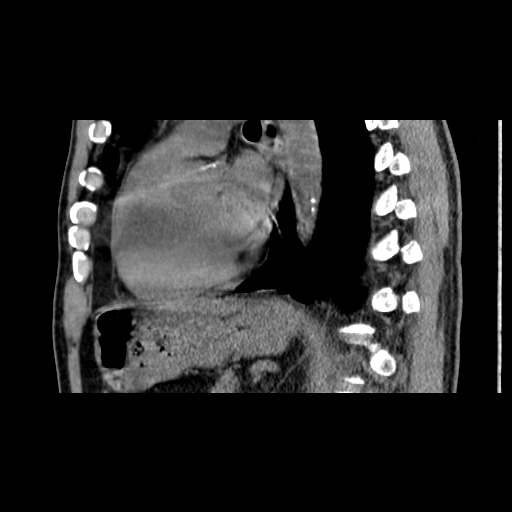
[im 92/129  mediastinal]
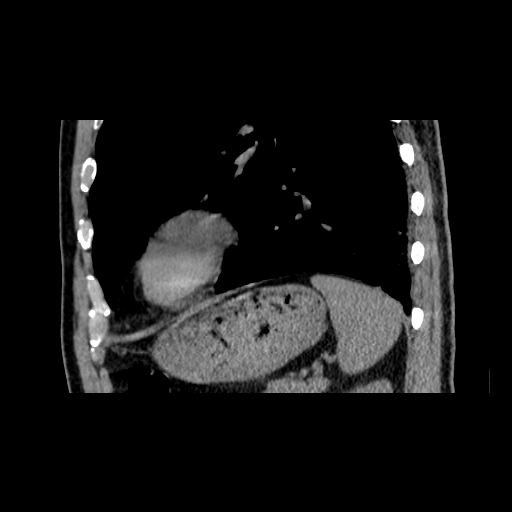
[im 110/129  mediastinal]
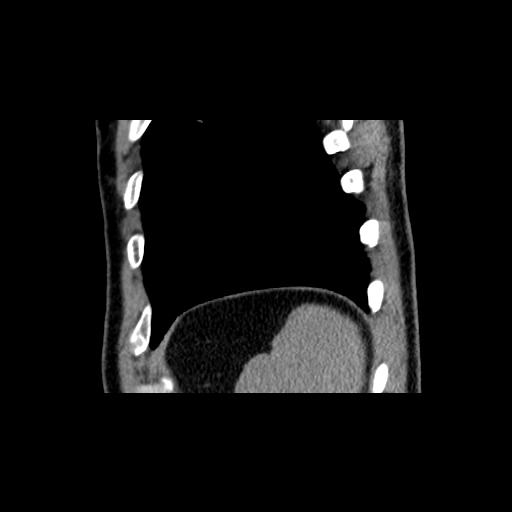

[18 of 30 positions shown; findings below may reference images not displayed]

FINDINGS: Severe apical emphysema is present bilaterally.  There is
moderate to severe basilar emphysema bilaterally.  The lungs are
hyperinflated.

Left upper lobe mass has changed in the interval.  The lesion has a
more solid appearance on today's study with spiculated margins.
The solid component measures approximately 4.8 x 2.7 cm.
Surrounding this is some streaky density in the adjacent lung which
has improved somewhat in the interval.  This appearance is
concerning for carcinoma.  Infection is also possible.

5 x 6 mm nodule in the lingula appears slightly larger.  No new
lung nodules are present.

Negative for pneumonia or pleural effusion.  No mediastinal
adenopathy.  Coronary artery calcification is present.
IMPRESSION: Left upper lobe lesion appears more mass-like on today's study.
Improvement in surrounding inflammatory change.  This lesion is
suspicious for carcinoma of the lung.  Infection considered less
likely at this point.

5 x 6 mm nodule in the lingula appears slightly more prominent than
on the prior study.

## 2012-11-28 IMAGING — CR DG CHEST 1V PORT
1 series · 1 of 1 positions shown · non-contrast
Comparison: CT chest 09/02/2011 prior chest x-ray 07/16/2011.

CLINICAL DATA: Dizziness with chest pain and emesis

PORTABLE CHEST - 1 VIEW

[view not recorded]
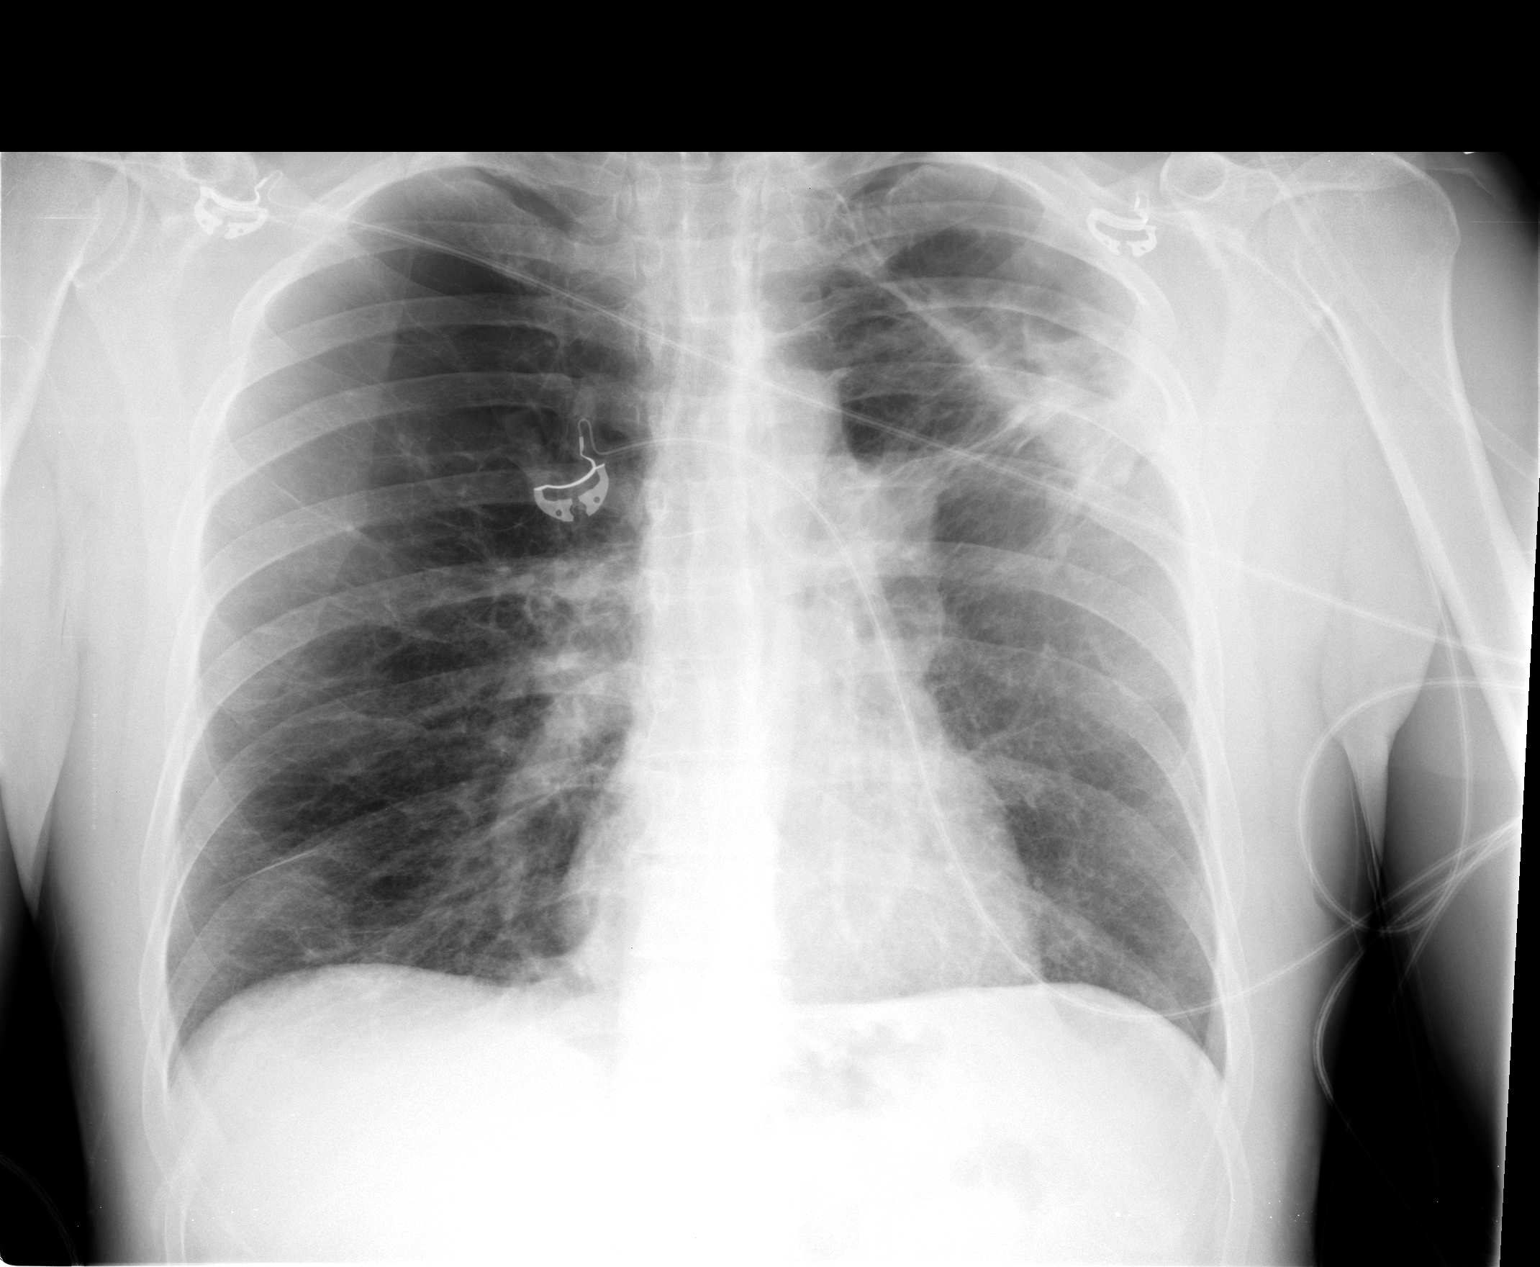

[1 of 1 positions shown; findings below may reference images not displayed]

FINDINGS: Left upper lobe mass like lesion persists, indeterminate
for neoplasm versus post inflammatory process. Baseline COPD.
Moderate scarring on the left.  Right lung remains clear.  No
effusion or pneumothorax.  No acute osseous findings.
IMPRESSION: Left upper lobe mass like lesion grossly stable from [facility].  No
definite active infiltrates.

## 2012-12-03 ENCOUNTER — Other Ambulatory Visit: Payer: Self-pay | Admitting: *Deleted

## 2012-12-03 DIAGNOSIS — J984 Other disorders of lung: Secondary | ICD-10-CM

## 2012-12-20 ENCOUNTER — Other Ambulatory Visit: Payer: Self-pay | Admitting: *Deleted

## 2012-12-21 ENCOUNTER — Ambulatory Visit
Admission: RE | Admit: 2012-12-21 | Discharge: 2012-12-21 | Disposition: A | Discharge status: Home / Self Care | Payer: Medicare Other | Source: Ambulatory Visit | Attending: Thoracic Surgery (Cardiothoracic Vascular Surgery) | Admitting: Thoracic Surgery (Cardiothoracic Vascular Surgery)

## 2012-12-21 ENCOUNTER — Ambulatory Visit (INDEPENDENT_AMBULATORY_CARE_PROVIDER_SITE_OTHER): Payer: Medicare Other | Admitting: Thoracic Surgery (Cardiothoracic Vascular Surgery)

## 2012-12-21 ENCOUNTER — Encounter: Payer: Self-pay | Admitting: Thoracic Surgery (Cardiothoracic Vascular Surgery)

## 2012-12-21 VITALS — BP 123/85 | HR 92 | Resp 20 | Ht 67.0 in | Wt 141.0 lb

## 2012-12-21 DIAGNOSIS — J984 Other disorders of lung: Secondary | ICD-10-CM

## 2012-12-21 DIAGNOSIS — R222 Localized swelling, mass and lump, trunk: Secondary | ICD-10-CM

## 2012-12-21 DIAGNOSIS — J438 Other emphysema: Secondary | ICD-10-CM | POA: Diagnosis not present

## 2012-12-21 DIAGNOSIS — R911 Solitary pulmonary nodule: Secondary | ICD-10-CM | POA: Diagnosis not present

## 2012-12-21 DIAGNOSIS — R918 Other nonspecific abnormal finding of lung field: Secondary | ICD-10-CM

## 2012-12-21 DIAGNOSIS — R091 Pleurisy: Secondary | ICD-10-CM | POA: Diagnosis not present

## 2012-12-21 MED ORDER — BUPROPION HCL ER (SR) 150 MG PO TB12: ORAL_TABLET | ORAL | Status: DC

## 2012-12-21 NOTE — Progress Notes (Signed)
HPI:  Mr. Dayhoff is a 52 year old gentleman with a cavitary left upper lobe mass who returns for a scheduled followup visit.  Mr. Snowdon is a 52 year old with a long history of heavy tobacco abuse. He was found to have a cavitary lung mass back in 2012. Dr. Edwyna Shell did a navigational bronchoscopy. 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 He was treated with antibiotics. He has been followed since that time with serial CT scans. The masses remained stable over time.  He continues to smoke over a pack a day. He says that he has tried almost everything the quit in the past. The only can ever helped him quit smoking was Wellbutrin. Chantix was ineffective.  He had a severe episode of bronchitis and COPD flare about 2 months ago. He was seen in the emergency room with complaints of left-sided chest pain. He says that he continues to have this pain and Advil l and Aleve have been ineffective. He is requesting a prescription for oxycodone.  He states that he is in the process of changing his primary care.  Past Medical History  Diagnosis Date  . Emphysema   . COPD (chronic obstructive pulmonary disease)   . Hypercholesteremia   . Pneumonia   . Polycythemia   . Coronary artery disease   . Cavitary lesion of lung 05/07/2011  . Borderline diabetes   . Seizures     last seizure 2 yrs ago  . GERD (gastroesophageal reflux disease)   . Diabetes mellitus   . 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 Chronic back pain   . Bronchitis       Current Outpatient Prescriptions  Medication Sig Dispense Refill  . albuterol (VENTOLIN HFA) 108 (90 BASE) MCG/ACT inhaler Inhale 2 puffs into the lungs every 4 (four) hours as needed. Shortness of breath      . Fluticasone-Salmeterol (ADVAIR DISKUS) 250-50 MCG/DOSE AEPB Inhale 1 puff into the lungs every 12 (twelve) hours.       [name] tiotropium (SPIRIVA) 18 MCG inhalation capsule Place 18 mcg into inhaler and inhale daily.       [name] buPROPion (WELLBUTRIN SR) 150 MG 12 hr tablet Take 1 tablet by mouth daily for 3 days, then take one tablet twice daily  60 tablet  6   No current facility-administered medications for this visit.    Physical Exam BP 123/85  Pulse 92  Resp 20  Ht 5\' 7"  (1.702 m)  Wt 141 lb (63.957 kg)  BMI 22.08 kg/m2  SpO63 64% 52 year old male in no acute distress Alert and oriented x3 Diminished breath sounds bilaterally, no active wheezing Cardiac regular rate and rhythm No cervical or subclavicular adenopathy  Diagnostic Tests: CT of chest 12/21/2012 *RADIOLOGY REPORT*  Clinical Data: Left upper lobe cavitary mass. Left chest pain for  2 months. History bronchitis. Smoker. Prior biopsy demonstrating  mycobacterial infection.  CT CHEST WITHOUT CONTRAST  Technique: Multidetector CT imaging of the chest was performed  following the standard protocol without IV contrast.  Comparison: 11/13/2012 plain film. 09/07/2012 CT.  Findings: Lungs/pleura: Severe centrilobular emphysema. Mild  dependent bibasilar atelectasis.  Similar lingular nodule at 5 mm. Image 43/series 4.  Partially cavitary left upper lobe lung mass again identified.  This measures 4.7 x 2.1 cm on image 20/series 4 versus 4.3 x 2.2 cm  at the same level on the prior exam. On sagittal reformatted  images, maximally 5.7 cm (image 108). Similar to on the prior  exam  (when remeasured).  No pleural fluid. Similar pleural thickening adjacent the left  upper lobe cavitary mass.  Heart/Mediastinum: No middle mediastinal adenopathy. Hilar regions  poorly evaluated secondary lack of IV contrast. There is subtle  soft tissue fullness in the left suprahilar region, lateral to the  AP window. This measures 1.2 x 1.5 cm on image 27/series 3. This  is likely similar to on the prior exam, and is along the path of  the superior left pulmonary vein. Favored to be positioned just  posterior. Normal heart size, without pericardial effusion.   Multivessel coronary artery atherosclerosis.  Upper abdomen: No significant findings.  Bones/Musculoskeletal: No acute osseous abnormality.  IMPRESSION:  1. Similar size of a cavitary left upper lobe lung mass. Adjacent  similar left-sided pleural thickening.  2. Suspicion of left suprahilar adenopathy. This is suboptimally  evaluated on this unenhanced exam, but favored to be similar to on  the prior. Obscured on the prior secondary to low dose technique.  This is presumably reactive, given the benign prior biopsies.  3. Centrilobular emphysema with a similar 5 mm lingular nodule.  4. Age advanced coronary artery atherosclerosis. Recommend  assessment of coronary risk factors and consideration of medical  therapy.  Original Report Authenticated By: Jeronimo Greaves, M.D.   Impression: 52 year old heavy smoker with severe COPD and a cavitary left upper lobe mass. 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 The residual cavitary mass is being followed with serial CT scans. It showed no change of significance since December of 2012. We will plan to do one final CT in 6 months.  He is requesting narcotics. He says he is between primary care practices as he is in the process of changing. I gave him a prescription for oxycodone 5 mg tablets one to 2 twice daily as needed for severe pain. 30 tablets with no refills. I had a long discussion with him regarding the fact that narcotics do not believe the underlying source of the pain and nonsteroidal anti-inflammatories or better long-term option for him. He may use the oxycodone as needed for times when the pain is severe. I do not plan to give him narcotics beyond this prescription.  Plan: Return in 6 months with CT of chest

## 2013-01-13 IMAGING — CR DG CERVICAL SPINE COMPLETE 4+V
5 series · 5 of 5 positions shown · non-contrast
Comparison: 08/27/2003.

CLINICAL DATA: Neck pain and left arm pain.

CERVICAL SPINE - COMPLETE 4+ VIEW

[w c-spine lat]
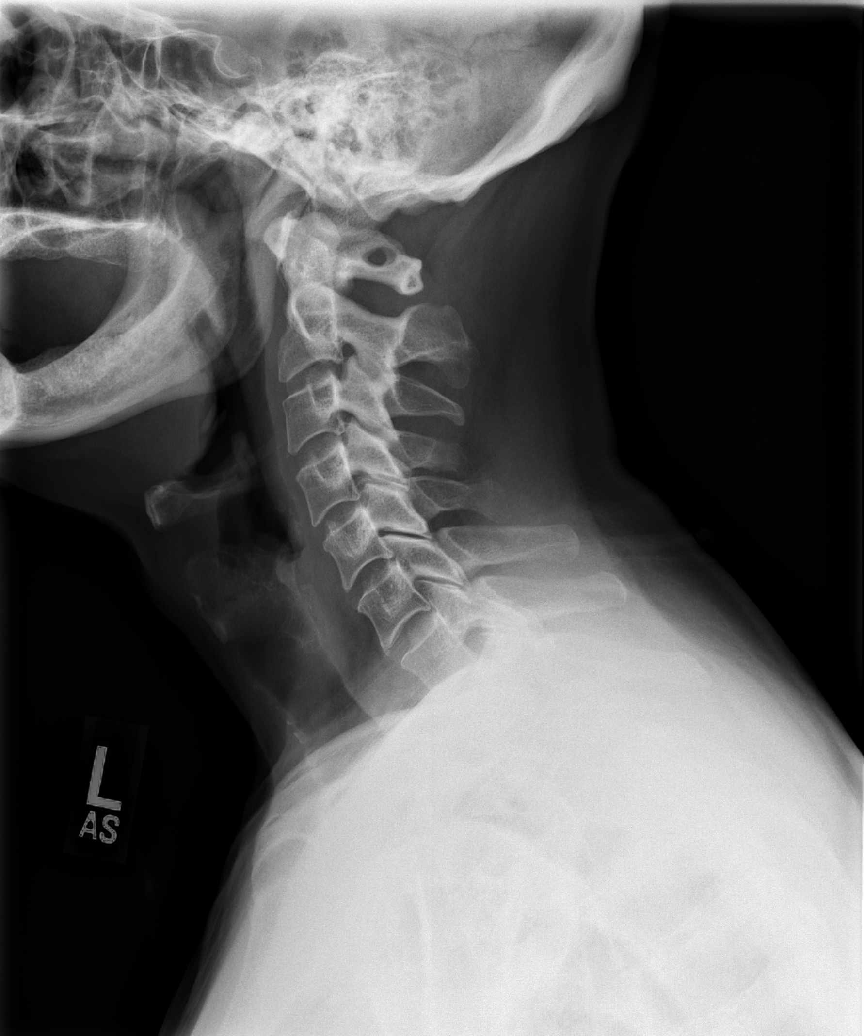

[w c-spine oblique (1 of 2)]
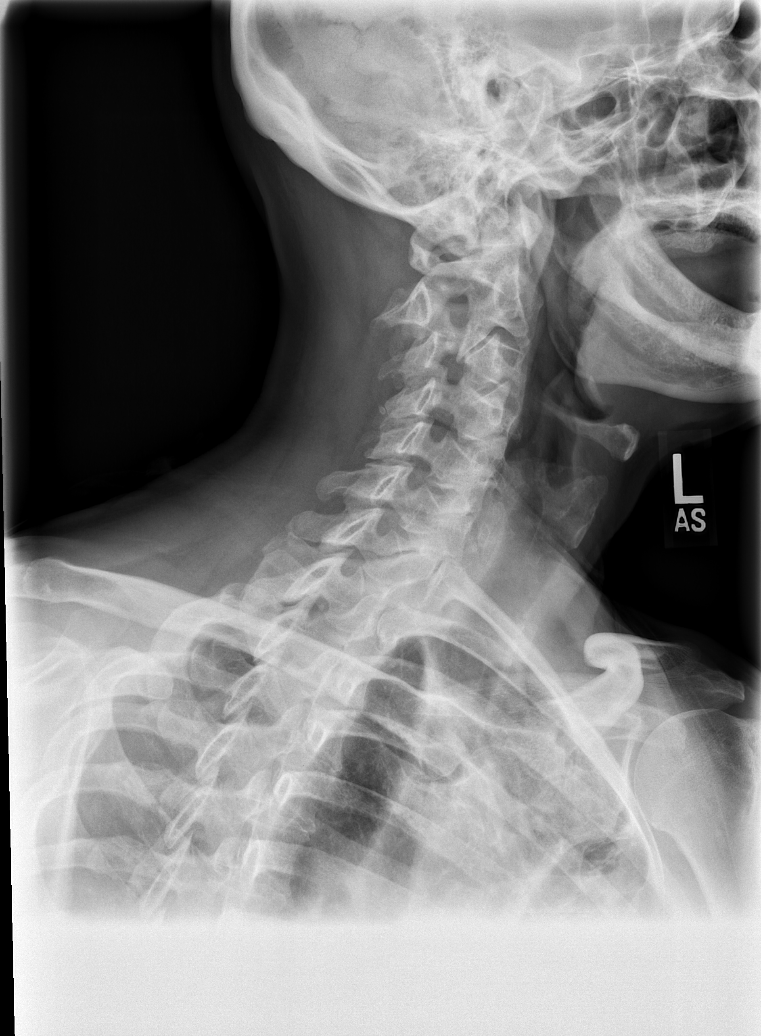

[w c-spine oblique (2 of 2)]
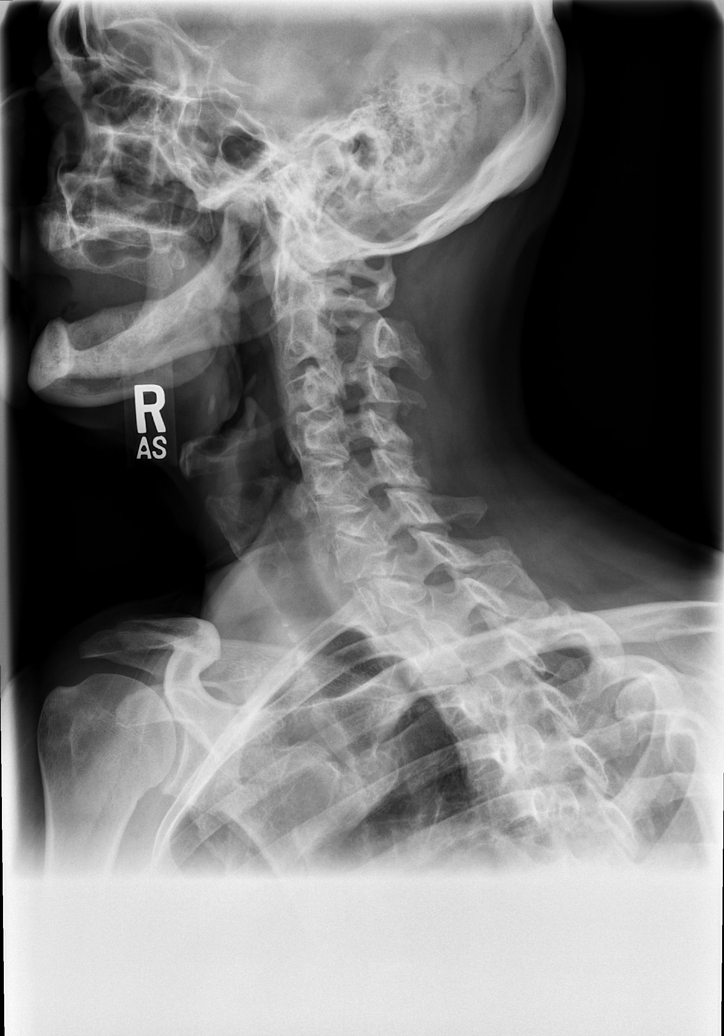

[w c-spine a.p. *]
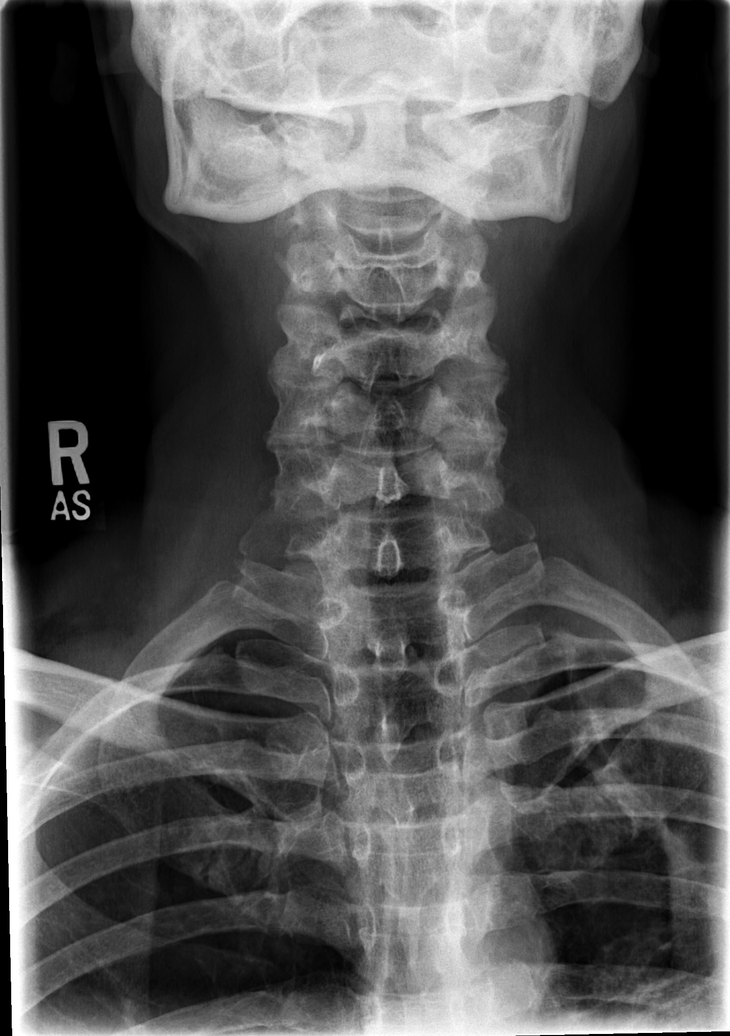

[w c-spine odontoid *]
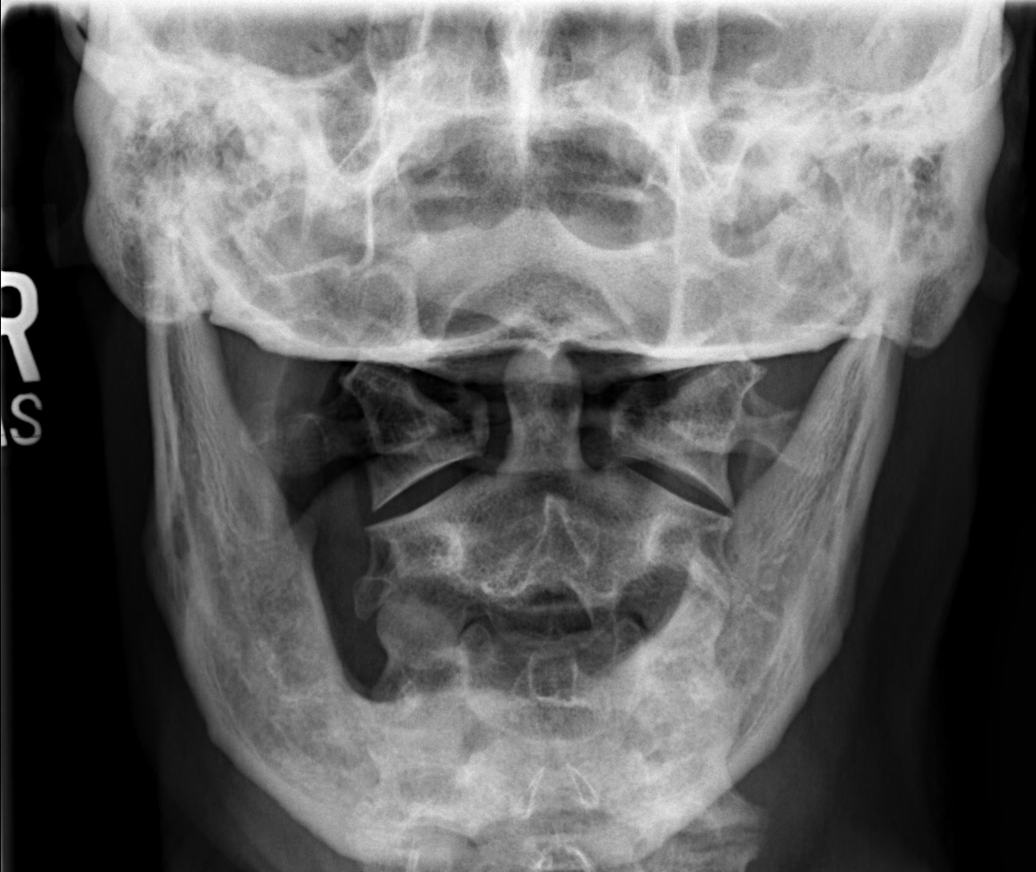

[5 of 5 positions shown; findings below may reference images not displayed]

FINDINGS: Cervical spine is visualized from the occiput to C7.  The
cervicothoracic junction is obscured by the patient's shoulders.
There [DATE] mm of retrolisthesis of C2 on C3.  Alignment is
otherwise anatomic.  Vertebral body and disc space height are
maintained.  Mild facet sclerosis at multiple levels.  Prevertebral
soft tissues are within normal limits.  Neural foramina appear
grossly patent. There may be some mild impingement bilaterally at
C2-3.  Visualized lung apices show linear density in the apex of
the left upper lobe.
IMPRESSION: 1.  Minimal retrolisthesis of C2 and C3, as discussed above.
2.  Mild facet arthropathy.
3.  Linear density in the left upper lobe is better visualized on
09/11/2011.

## 2013-01-18 ENCOUNTER — Emergency Department (HOSPITAL_COMMUNITY)
Admission: EM | Admit: 2013-01-18 | Discharge: 2013-01-18 | Disposition: A | Discharge status: Home / Self Care | Payer: Medicare Other | Attending: Emergency Medicine | Admitting: Emergency Medicine

## 2013-01-18 ENCOUNTER — Encounter (HOSPITAL_COMMUNITY): Payer: Self-pay | Admitting: *Deleted

## 2013-01-18 ENCOUNTER — Emergency Department (HOSPITAL_COMMUNITY): Payer: Medicare Other

## 2013-01-18 DIAGNOSIS — Z9889 Other specified postprocedural states: Secondary | ICD-10-CM | POA: Insufficient documentation

## 2013-01-18 DIAGNOSIS — R0602 Shortness of breath: Secondary | ICD-10-CM | POA: Diagnosis not present

## 2013-01-18 DIAGNOSIS — Z8701 Personal history of pneumonia (recurrent): Secondary | ICD-10-CM | POA: Diagnosis not present

## 2013-01-18 DIAGNOSIS — M25519 Pain in unspecified shoulder: Secondary | ICD-10-CM | POA: Diagnosis not present

## 2013-01-18 DIAGNOSIS — J40 Bronchitis, not specified as acute or chronic: Secondary | ICD-10-CM

## 2013-01-18 DIAGNOSIS — IMO0002 Reserved for concepts with insufficient information to code with codable children: Secondary | ICD-10-CM | POA: Insufficient documentation

## 2013-01-18 DIAGNOSIS — J209 Acute bronchitis, unspecified: Secondary | ICD-10-CM | POA: Diagnosis not present

## 2013-01-18 DIAGNOSIS — M546 Pain in thoracic spine: Secondary | ICD-10-CM | POA: Insufficient documentation

## 2013-01-18 DIAGNOSIS — J438 Other emphysema: Secondary | ICD-10-CM | POA: Diagnosis not present

## 2013-01-18 DIAGNOSIS — R52 Pain, unspecified: Secondary | ICD-10-CM | POA: Diagnosis not present

## 2013-01-18 DIAGNOSIS — R05 Cough: Secondary | ICD-10-CM | POA: Insufficient documentation

## 2013-01-18 DIAGNOSIS — J9801 Acute bronchospasm: Secondary | ICD-10-CM

## 2013-01-18 DIAGNOSIS — Z8709 Personal history of other diseases of the respiratory system: Secondary | ICD-10-CM | POA: Insufficient documentation

## 2013-01-18 DIAGNOSIS — Z79899 Other long term (current) drug therapy: Secondary | ICD-10-CM | POA: Diagnosis not present

## 2013-01-18 DIAGNOSIS — G8929 Other chronic pain: Secondary | ICD-10-CM | POA: Diagnosis not present

## 2013-01-18 DIAGNOSIS — E119 Type 2 diabetes mellitus without complications: Secondary | ICD-10-CM | POA: Diagnosis not present

## 2013-01-18 DIAGNOSIS — Z862 Personal history of diseases of the blood and blood-forming organs and certain disorders involving the immune mechanism: Secondary | ICD-10-CM | POA: Insufficient documentation

## 2013-01-18 DIAGNOSIS — M25512 Pain in left shoulder: Secondary | ICD-10-CM

## 2013-01-18 DIAGNOSIS — F17200 Nicotine dependence, unspecified, uncomplicated: Secondary | ICD-10-CM | POA: Insufficient documentation

## 2013-01-18 DIAGNOSIS — Z8719 Personal history of other diseases of the digestive system: Secondary | ICD-10-CM | POA: Diagnosis not present

## 2013-01-18 DIAGNOSIS — J441 Chronic obstructive pulmonary disease with (acute) exacerbation: Secondary | ICD-10-CM | POA: Insufficient documentation

## 2013-01-18 DIAGNOSIS — Z8669 Personal history of other diseases of the nervous system and sense organs: Secondary | ICD-10-CM | POA: Diagnosis not present

## 2013-01-18 DIAGNOSIS — M5030 Other cervical disc degeneration, unspecified cervical region: Secondary | ICD-10-CM | POA: Diagnosis not present

## 2013-01-18 DIAGNOSIS — Z8639 Personal history of other endocrine, nutritional and metabolic disease: Secondary | ICD-10-CM | POA: Insufficient documentation

## 2013-01-18 DIAGNOSIS — R059 Cough, unspecified: Secondary | ICD-10-CM | POA: Diagnosis not present

## 2013-01-18 DIAGNOSIS — I2510 Atherosclerotic heart disease of native coronary artery without angina pectoris: Secondary | ICD-10-CM | POA: Insufficient documentation

## 2013-01-18 LAB — COMPREHENSIVE METABOLIC PANEL
ALT: 16 U/L (ref 0–53)
AST: 17 U/L (ref 0–37)
Albumin: 3.4 g/dL — ABNORMAL LOW (ref 3.5–5.2)
Calcium: 9.3 mg/dL (ref 8.4–10.5)
Chloride: 100 mEq/L (ref 96–112)
Creatinine, Ser: 1.18 mg/dL (ref 0.50–1.35)
Sodium: 136 mEq/L (ref 135–145)
Total Bilirubin: 0.2 mg/dL — ABNORMAL LOW (ref 0.3–1.2)

## 2013-01-18 LAB — CBC WITH DIFFERENTIAL/PLATELET
Basophils Absolute: 0 10*3/uL (ref 0.0–0.1)
Basophils Relative: 1 % (ref 0–1)
HCT: 48 % (ref 39.0–52.0)
Lymphocytes Relative: 33 % (ref 12–46)
MCHC: 35 g/dL (ref 30.0–36.0)
Monocytes Absolute: 0.8 10*3/uL (ref 0.1–1.0)
Neutro Abs: 4.4 10*3/uL (ref 1.7–7.7)
Neutrophils Relative %: 54 % (ref 43–77)
Platelets: 192 10*3/uL (ref 150–400)
RDW: 13.5 % (ref 11.5–15.5)
WBC: 8.1 10*3/uL (ref 4.0–10.5)

## 2013-01-18 MED ORDER — OXYCODONE-ACETAMINOPHEN 5-325 MG PO TABS
1.0000 | ORAL_TABLET | Freq: Once | ORAL | Status: AC
  Administered 2013-01-18: 1 via ORAL
  Filled 2013-01-18: qty 1

## 2013-01-18 MED ORDER — PREDNISONE 10 MG PO TABS: 20.0000 mg | ORAL_TABLET | Freq: Every day | ORAL | Status: DC

## 2013-01-18 MED ORDER — ONDANSETRON HCL 4 MG/2ML IJ SOLN
INTRAMUSCULAR | Status: AC
  Administered 2013-01-18: 4 mg via INTRAVENOUS
  Filled 2013-01-18: qty 2

## 2013-01-18 MED ORDER — ONDANSETRON HCL 4 MG/2ML IJ SOLN: 4.0000 mg | Freq: Once | INTRAMUSCULAR | Status: AC

## 2013-01-18 MED ORDER — IPRATROPIUM BROMIDE 0.02 % IN SOLN
0.5000 mg | Freq: Once | RESPIRATORY_TRACT | Status: AC
  Administered 2013-01-18: 0.5 mg via RESPIRATORY_TRACT
  Filled 2013-01-18: qty 2.5

## 2013-01-18 MED ORDER — HYDROCODONE-ACETAMINOPHEN 5-325 MG PO TABS: 1.0000 | ORAL_TABLET | Freq: Four times a day (QID) | ORAL | Status: DC | PRN

## 2013-01-18 MED ORDER — SULFAMETHOXAZOLE-TRIMETHOPRIM 800-160 MG PO TABS: 1.0000 | ORAL_TABLET | Freq: Two times a day (BID) | ORAL | Status: DC

## 2013-01-18 MED ORDER — METHYLPREDNISOLONE SODIUM SUCC 125 MG IJ SOLR
125.0000 mg | Freq: Once | INTRAMUSCULAR | Status: AC
  Administered 2013-01-18: 125 mg via INTRAVENOUS
  Filled 2013-01-18: qty 2

## 2013-01-18 MED ORDER — HYDROCODONE-ACETAMINOPHEN 5-325 MG PO TABS: 2.0000 | ORAL_TABLET | ORAL | Status: DC | PRN

## 2013-01-18 MED ORDER — ALBUTEROL SULFATE (5 MG/ML) 0.5% IN NEBU
5.0000 mg | INHALATION_SOLUTION | Freq: Once | RESPIRATORY_TRACT | Status: AC
  Administered 2013-01-18: 5 mg via RESPIRATORY_TRACT
  Filled 2013-01-18: qty 1

## 2013-01-18 MED ORDER — HYDROMORPHONE HCL PF 1 MG/ML IJ SOLN
INTRAMUSCULAR | Status: AC
  Administered 2013-01-18: 1 mg
  Filled 2013-01-18: qty 1

## 2013-01-18 NOTE — ED Notes (Signed)
C/O progressive chest/back/shoulder pain over last month.  Increased SOB, w/more frequent use of home O2.  Uses Spiriva and ventolin and advair at home.  Saw his "lung" doctor last month.  Had CT of chest.  Had to "practically beg him for something for increased pain in chest."  Has used Advil for pain which has not helped.

## 2013-01-18 NOTE — ED Notes (Signed)
Pt vomitted. RT at bedside and reported would attempt to admin breathing treatment once pt nausea/vomitting subsides.

## 2013-01-18 NOTE — ED Provider Notes (Signed)
History  This chart was scribed for Ryan Lennert, MD, by Candelaria Stagers, ED Scribe. This patient was seen in room [facility]18[facility]18 and the patient's care was started at 5:50 PM  CSN: [id number] Arrival date & time 01/18/13  1604  First MD Initiated Contact with Patient 01/18/13 1748     Chief Complaint  Patient presents with  . Back Pain  . Shoulder Pain  . Cough  . Shortness of Breath    Patient is a 52 y.o. male presenting with cough. The history is provided by the patient. No language interpreter was used.  Cough Cough characteristics:  Productive Sputum characteristics:  Yellow Severity:  Moderate Onset quality:  Gradual Timing:  Constant Progression:  Worsening Chronicity:  Chronic Smoker: yes   Context comment:  H/o emphysema Relieved by:  Nothing Worsened by:  Nothing tried Ineffective treatments:  None tried Associated symptoms: shortness of breath and wheezing    HPI Comments: Ryan [name] is a 52 y.o. male who presents to the Emergency Department complaining of a flare up of emphysema including productive cough and SOB.  Pt reports the sputum is yellow.  He has h/o emphysema.  Pt has never been admitted to the hospital for complications of emphysema.  He is also experiencing back and bilateral shoulder pain that started one month ago.  Pt has taken advil for pain with no relief.  He has used Spiriva and Advair at home with no relief of SOB>    Past Medical History  Diagnosis Date  . Emphysema   . COPD (chronic obstructive pulmonary disease)   . Hypercholesteremia   . Pneumonia   . Polycythemia   . Coronary artery disease   . Cavitary lesion of lung 05/07/2011  . Borderline diabetes   . Seizures     last seizure 2 yrs ago  . GERD (gastroesophageal reflux disease)   . Diabetes mellitus   . 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 Chronic back pain   . Bronchitis    Past Surgical History  Procedure Laterality Date  . Angioplasty    .  Colonoscopy w/ endoscopic [place]    . Vasectomy    . Throat biopsy    . Lung biopsy    . Vasectomy  1987  . Cardiac surgery     Family History  Problem Relation Age of Onset  . Hypertension Mother   . Diabetes Mother   . Heart attack Mother   . Heart failure Mother   . Hypertension Sister   . Diabetes Sister   . Heart failure Sister    History  Substance Use Topics  . Smoking status: Current Every Day Smoker -- 1.00 packs/day for 45 years    Types: Cigarettes  . Smokeless tobacco: Former Neurosurgeon  . Alcohol Use: No    Review of Systems  Respiratory: Positive for cough, shortness of breath and wheezing.   Musculoskeletal: Positive for back pain and arthralgias (bilateral shoulder pain).  All other systems reviewed and are negative.    Allergies  Influenza vaccine live  Home Medications   Current Outpatient Rx  Name  Route  Sig  Dispense  Refill  . albuterol (VENTOLIN HFA) 108 (90 BASE) MCG/ACT inhaler   Inhalation   Inhale 2 puffs into the lungs every 4 (four) hours as needed. Shortness of breath         . buPROPion (WELLBUTRIN SR) 150 MG 12 hr tablet  Take 1 tablet by mouth daily for 3 days, then take one tablet twice daily   60 tablet   6   . Fluticasone-Salmeterol (ADVAIR DISKUS) 250-50 MCG/DOSE AEPB   Inhalation   Inhale 1 puff into the lungs every 12 (twelve) hours.          [name] tiotropium (SPIRIVA) 18 MCG inhalation capsule   Inhalation   Place 18 mcg into inhaler and inhale daily.          BP 111/77  Pulse 86  Temp(Src) 97.8 F (36.6 C)  Resp 20  Ht 5\' 7"  (1.702 m)  Wt 140 lb (63.504 kg)  BMI 21.92 kg/m2  SpO2 95% Physical Exam  Nursing note and vitals reviewed. Constitutional: He is oriented to person, place, and time. He appears well-developed and well-nourished. No distress.  HENT:  Head: Normocephalic and atraumatic.  Eyes: EOM are normal.  Neck: Neck supple. No tracheal deviation present.  Cardiovascular: Normal rate.    Pulmonary/Chest: He has wheezes (mild wheezing bilaterally).  Musculoskeletal: Normal range of motion. He exhibits tenderness (mild tenderness to left shoulder).  Neurological: He is alert and oriented to person, place, and time.  Skin: Skin is warm and dry.  Psychiatric: He has a normal mood and affect. His behavior is normal.    ED Course  Procedures   DIAGNOSTIC STUDIES: Oxygen Saturation is 95% on room air, normal by my interpretation.    COORDINATION OF CARE:  5:54 PM Discussed course of care with pt which includes breathing treatment.   8:31 PM Wheezing is improved after breathing treatment.  Course of care discussed.  Advised follow up with PCP.  Pt understands and agrees.   Labs Reviewed  COMPREHENSIVE METABOLIC PANEL - Abnormal; Notable for the following:    Albumin 3.4 (*)    Total Bilirubin 0.2 (*)    GFR calc non Af Amer 68 (*)    GFR calc Af Amer 79 (*)    All other components within normal limits  CBC WITH DIFFERENTIAL   Dg Chest 2 View  01/18/2013   *RADIOLOGY REPORT*  Clinical Data: Back and shoulder pain.  Cough.  Shortness of breath.  COPD.  Emphysema.  CHEST - 2 VIEW  Comparison: CTs including 12/21/2012.  Most recent plain film of 11/13/2012.  Findings: Midline trachea.  Normal heart size.  Left suprahilar soft tissue fullness again suspected. No pleural effusion or pneumothorax.  Similar appearance of the posterior left upper lobe cavitary process.  Underlying emphysema.  Lower lobe predominant interstitial thickening.  Mild left greater than right bibasilar scarring.  IMPRESSION: No significant change in left upper lobe cavitary process and adjacent left hilar/suprahilar adenopathy.  Please see prior CT reports, including 12/21/2012, for further description.   Original Report Authenticated By: Jeronimo Greaves, M.D.   No diagnosis found.  MDM  Copd.  Pt improved with tx The chart was scribed for me under my direct supervision.  I personally performed the history,  physical, and medical decision making and all procedures in the evaluation of this patient.Ryan Lennert, MD 01/18/13 2036

## 2013-01-31 MED FILL — Hydrocodone-Acetaminophen Tab 5-325 MG: ORAL | Qty: 6 | Status: AC

## 2013-02-17 ENCOUNTER — Encounter (HOSPITAL_COMMUNITY): Payer: Self-pay | Admitting: Emergency Medicine

## 2013-02-17 ENCOUNTER — Emergency Department (HOSPITAL_COMMUNITY): Payer: Medicare Other

## 2013-02-17 ENCOUNTER — Emergency Department (HOSPITAL_COMMUNITY)
Admission: EM | Admit: 2013-02-17 | Discharge: 2013-02-18 | Disposition: A | Discharge status: Home / Self Care | Payer: Medicare Other | Attending: Emergency Medicine | Admitting: Emergency Medicine

## 2013-02-17 DIAGNOSIS — J449 Chronic obstructive pulmonary disease, unspecified: Secondary | ICD-10-CM | POA: Diagnosis not present

## 2013-02-17 DIAGNOSIS — G8929 Other chronic pain: Secondary | ICD-10-CM | POA: Diagnosis not present

## 2013-02-17 DIAGNOSIS — Z8719 Personal history of other diseases of the digestive system: Secondary | ICD-10-CM | POA: Diagnosis not present

## 2013-02-17 DIAGNOSIS — Z8669 Personal history of other diseases of the nervous system and sense organs: Secondary | ICD-10-CM | POA: Diagnosis not present

## 2013-02-17 DIAGNOSIS — Z9861 Coronary angioplasty status: Secondary | ICD-10-CM | POA: Insufficient documentation

## 2013-02-17 DIAGNOSIS — M25512 Pain in left shoulder: Secondary | ICD-10-CM

## 2013-02-17 DIAGNOSIS — E119 Type 2 diabetes mellitus without complications: Secondary | ICD-10-CM | POA: Diagnosis not present

## 2013-02-17 DIAGNOSIS — F17200 Nicotine dependence, unspecified, uncomplicated: Secondary | ICD-10-CM | POA: Insufficient documentation

## 2013-02-17 DIAGNOSIS — J438 Other emphysema: Secondary | ICD-10-CM | POA: Diagnosis not present

## 2013-02-17 DIAGNOSIS — Z8709 Personal history of other diseases of the respiratory system: Secondary | ICD-10-CM | POA: Diagnosis not present

## 2013-02-17 DIAGNOSIS — R071 Chest pain on breathing: Secondary | ICD-10-CM | POA: Diagnosis not present

## 2013-02-17 DIAGNOSIS — Z8701 Personal history of pneumonia (recurrent): Secondary | ICD-10-CM | POA: Insufficient documentation

## 2013-02-17 DIAGNOSIS — M25519 Pain in unspecified shoulder: Secondary | ICD-10-CM | POA: Insufficient documentation

## 2013-02-17 DIAGNOSIS — Z79899 Other long term (current) drug therapy: Secondary | ICD-10-CM | POA: Diagnosis not present

## 2013-02-17 DIAGNOSIS — Z7982 Long term (current) use of aspirin: Secondary | ICD-10-CM | POA: Insufficient documentation

## 2013-02-17 DIAGNOSIS — Z8639 Personal history of other endocrine, nutritional and metabolic disease: Secondary | ICD-10-CM | POA: Insufficient documentation

## 2013-02-17 DIAGNOSIS — I2510 Atherosclerotic heart disease of native coronary artery without angina pectoris: Secondary | ICD-10-CM | POA: Insufficient documentation

## 2013-02-17 DIAGNOSIS — Z8739 Personal history of other diseases of the musculoskeletal system and connective tissue: Secondary | ICD-10-CM | POA: Insufficient documentation

## 2013-02-17 DIAGNOSIS — J4489 Other specified chronic obstructive pulmonary disease: Secondary | ICD-10-CM | POA: Insufficient documentation

## 2013-02-17 DIAGNOSIS — Z862 Personal history of diseases of the blood and blood-forming organs and certain disorders involving the immune mechanism: Secondary | ICD-10-CM | POA: Insufficient documentation

## 2013-02-17 HISTORY — DX: Pain in left shoulder: M25.512

## 2013-02-17 HISTORY — DX: Other chronic pain: G89.29

## 2013-02-17 HISTORY — DX: Cervicalgia: M54.2

## 2013-02-17 LAB — CBC WITH DIFFERENTIAL/PLATELET
Basophils Relative: 0 % (ref 0–1)
Eosinophils Absolute: 0.1 10*3/uL (ref 0.0–0.7)
Eosinophils Relative: 2 % (ref 0–5)
HCT: 47.6 % (ref 39.0–52.0)
Hemoglobin: 16.7 g/dL (ref 13.0–17.0)
MCH: 30.3 pg (ref 26.0–34.0)
MCHC: 35.1 g/dL (ref 30.0–36.0)
Monocytes Absolute: 0.7 10*3/uL (ref 0.1–1.0)
Monocytes Relative: 8 % (ref 3–12)

## 2013-02-17 MED ORDER — MORPHINE SULFATE 4 MG/ML IJ SOLN
4.0000 mg | INTRAMUSCULAR | Status: DC | PRN
  Administered 2013-02-17: 4 mg via INTRAVENOUS
  Filled 2013-02-17: qty 1

## 2013-02-17 NOTE — ED Notes (Signed)
Patient complaining of left-sided chest pain radiating into his left arm and back x 3 days.

## 2013-02-18 DIAGNOSIS — J438 Other emphysema: Secondary | ICD-10-CM | POA: Diagnosis not present

## 2013-02-18 LAB — BASIC METABOLIC PANEL
BUN: 14 mg/dL (ref 6–23)
Chloride: 99 mEq/L (ref 96–112)
Creatinine, Ser: 1 mg/dL (ref 0.50–1.35)
Glucose, Bld: 96 mg/dL (ref 70–99)
Potassium: 4.1 mEq/L (ref 3.5–5.1)

## 2013-02-18 MED ORDER — OXYCODONE-ACETAMINOPHEN 5-325 MG PO TABS: ORAL_TABLET | ORAL | Status: DC

## 2013-02-18 MED ORDER — NAPROXEN 250 MG PO TABS: 250.0000 mg | ORAL_TABLET | Freq: Two times a day (BID) | ORAL | Status: DC

## 2013-02-18 NOTE — ED Provider Notes (Signed)
CSN: [id number]     Arrival date & time 02/17/13  2149 History     First MD Initiated Contact with Patient 02/17/13 2314     Chief Complaint  Patient presents with  . Shoulder Pain    HPI Pt was seen at 2325.  Per pt, c/o gradual onset and persistence of constant acute flair of his chronic left upper arm and shoulder "pain" for the past 4 months, worse over the past 3 days. Describes the pain as constant and "burning," with radiation into the left side of his upper chest. Pain will also occasionally radiate into his left upper back. Pain worsens with movement of his shoulder and palpation of the area. States the pain worsened after he ran out of his usual narcotic pain meds. Denies palpitations, no SOB, no worsening cough from baseline, no abd pain, no N/V/D, no fevers, no rash, no injury, no focal motor weakness, no tingling/numbness in extremities, no neck pain.     Past Medical History  Diagnosis Date  . Emphysema   . COPD (chronic obstructive pulmonary disease)   . Hypercholesteremia   . Pneumonia   . Polycythemia   . Coronary artery disease   . 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 Borderline diabetes   . Seizures     last seizure 2 yrs ago  . GERD (gastroesophageal reflux disease)   . Diabetes mellitus   . 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 Chronic back pain   . Bronchitis   . Chronic left shoulder pain   . Chronic neck pain    Past Surgical History  Procedure Laterality Date  . Angioplasty    . Colonoscopy w/ endoscopic [place]    . Vasectomy    . Throat biopsy    . Lung biopsy    . Vasectomy  1987  . Cardiac surgery     Family History  Problem Relation Age of Onset  . Hypertension Mother   . Diabetes Mother   . Heart attack Mother   . Heart failure Mother   . Hypertension Sister   . Diabetes Sister   . Heart failure Sister    History  Substance Use Topics  . Smoking status: Current Every Day  Smoker -- 1.00 packs/day for 45 years    Types: Cigarettes  . Smokeless tobacco: Former Neurosurgeon  . Alcohol Use: No    Review of Systems ROS: Statement: All systems negative except as marked or noted in the HPI; Constitutional: Negative for fever and chills. ; ; Eyes: Negative for eye pain, redness and discharge. ; ; ENMT: Negative for ear pain, hoarseness, nasal congestion, sinus pressure and sore throat. ; ; Cardiovascular:  Negative for palpitations, diaphoresis, dyspnea and peripheral edema. ; ; Respiratory: Negative for cough, wheezing and stridor. ; ; Gastrointestinal: Negative for nausea, vomiting, diarrhea, abdominal pain, blood in stool, hematemesis, jaundice and rectal bleeding. . ; ; Genitourinary: Negative for dysuria, flank pain and hematuria. ; ; Musculoskeletal: +left shoulder pain. Negative for back pain and neck pain. Negative for swelling and trauma.; ; Skin: Negative for pruritus, rash, abrasions, blisters, bruising and skin lesion.; ; Neuro: Negative for headache, lightheadedness and neck stiffness. Negative for weakness, altered level of consciousness , altered mental status, extremity weakness, paresthesias, involuntary movement, seizure and syncope.     Allergies  Influenza vaccine live  Home Medications   Current Outpatient Rx  Name  Route  Sig  Dispense  Refill  . albuterol (VENTOLIN HFA) 108 (90 BASE) MCG/ACT inhaler   Inhalation   Inhale 2 puffs into the lungs every 4 (four) hours as needed. Shortness of breath         . aspirin EC 81 MG tablet   Oral   Take 81 mg by mouth daily.         . Fluticasone-Salmeterol (ADVAIR DISKUS) 250-50 MCG/DOSE AEPB   Inhalation   Inhale 1 puff into the lungs every 12 (twelve) hours.          [name] tiotropium (SPIRIVA) 18 MCG inhalation capsule   Inhalation   Place 18 mcg into inhaler and inhale daily.          BP 108/78  Pulse 71  Temp(Src) 97.9 F (36.6 C) (Oral)  Resp 18  Ht 5' 7.5" (1.715 m)  Wt 140 lb (63.504 kg)   BMI 21.59 kg/m2  SpO2 98% Physical Exam 2330: Physical examination:  Nursing notes reviewed; Vital signs and O2 SAT reviewed;  Constitutional: Well developed, Well nourished, Well hydrated, In no acute distress; Head:  Normocephalic, atraumatic; Eyes: EOMI, PERRL, No scleral icterus; ENMT: Mouth and pharynx normal, Mucous membranes moist; Neck: Supple, Full range of motion, No lymphadenopathy; Cardiovascular: Regular rate and rhythm, No gallop; Respiratory: Breath sounds clear & equal bilaterally, No wheezes.  Speaking full sentences with ease, Normal respiratory effort/excursion; Chest: +left upper chest wall tenderness to palp. No soft tissue crepitus, no rash. Movement normal; Abdomen: Soft, Nontender, Nondistended, Normal bowel sounds; Genitourinary: No CVA tenderness; Spine:  No midline CS, TS, LS tenderness.;;  Extremities: Pulses normal, +generalized TTP left shoulder and biceps areas which reproduces pt's pain, muscles compartments soft, no rash, no ecchymosis. No deformity, No edema, NMS intact left hand. No calf edema or asymmetry.; Neuro: AA&Ox3, Major CN grossly intact.  Speech clear. Climbs on and off stretcher easily by himself. Gait steady. No gross focal motor or sensory deficits in extremities.; Skin: Color normal, Warm, Dry.   ED Course   Procedures     MDM  MDM Reviewed: previous chart, nursing note and vitals Reviewed previous: labs, ECG and CT scan Interpretation: labs, ECG and x-ray    Date: 02/18/2013  Rate: 74  Rhythm: normal sinus rhythm  QRS Axis: normal  Intervals: normal  ST/T Wave abnormalities: normal  Conduction Disutrbances:none  Narrative Interpretation:   Old EKG Reviewed: unchanged; no significant changes from previous EKG dated 11/13/2012.  Results for orders placed during the hospital encounter of 02/17/13  BASIC METABOLIC PANEL      Result Value Range   Sodium 135  135 - 145 mEq/L   Potassium 4.1  3.5 - 5.1 mEq/L   Chloride 99  96 - 112 mEq/L    CO2 27  19 - 32 mEq/L   Glucose, Bld 96  70 - 99 mg/dL   BUN 14  6 - 23 mg/dL   Creatinine, Ser 1.61  0.50 - 1.35 mg/dL   Calcium 9.0  8.4 - 09.6 mg/dL   GFR calc non Af Amer 83 (*) >90 mL/min   GFR calc Af Amer >90  >90 mL/min  CBC WITH DIFFERENTIAL      Result Value Range   WBC 9.6  4.0 - 10.5 K/uL   RBC 5.52  4.22 - 5.81 MIL/uL   Hemoglobin 16.7  13.0 - 17.0 g/dL   HCT 04.5  40.9 - 81.1 %   MCV 86.2  78.0 - 100.0 fL  MCH 30.3  26.0 - 34.0 pg   MCHC 35.1  30.0 - 36.0 g/dL   RDW 40.9  81.1 - 91.4 %   Platelets 215  150 - 400 K/uL   Neutrophils Relative % 55  43 - 77 %   Neutro Abs 5.4  1.7 - 7.7 K/uL   Lymphocytes Relative 35  12 - 46 %   Lymphs Abs 3.3  0.7 - 4.0 K/uL   Monocytes Relative 8  3 - 12 %   Monocytes Absolute 0.7  0.1 - 1.0 K/uL   Eosinophils Relative 2  0 - 5 %   Eosinophils Absolute 0.1  0.0 - 0.7 K/uL   Basophils Relative 0  0 - 1 %   Basophils Absolute 0.0  0.0 - 0.1 K/uL  TROPONIN I      Result Value Range   Troponin I <0.30  <0.30 ng/mL   Dg Chest 2 View 02/18/2013   *RADIOLOGY REPORT*  Clinical Data: Chest pain, chronic shortness of breath, COPD, smoker  CHEST - 2 VIEW  Comparison: 01/18/2013, 12/21/2012  Findings: Severe advanced bullous emphysema.  Normal heart size and vascularity.  Stable left upper lobe cavitary mass and left hilar adenopathy.  No superimposed pneumonia, edema, large effusion, or pneumothorax.  Trachea midline.  IMPRESSION: Advance severe bullous emphysema  Stable Left upper lobe cavitary mass and hilar adenopathy  No superimposed acute process   Original Report Authenticated By: Judie Petit. Shick, M.D.     0200:  Pt states he feels completely improved after morphine and wants to go home now. Has climbed off the stretcher on his own, been ambulatory in the ED with upright steady gait, easy resps. Denies CP/SOB. Has tol PO well without N/V. Doubt PE as cause for symptoms with low risk [place].  Doubt ACS as cause for symptoms with normal troponin  and unchanged EKG from previous after 3 days of constant atypical symptoms. Long hx of chronic pain with multiple ED visits for same.  Pt endorses acute flair of his usual long standing chronic pain today, no change from his usual chronic pain pattern. States he is "in between doctors" at this time and is requesting a refill of his narcotic pain meds. Most recently was eval by his CTS MD and requested narcotic pain meds. States they told him that they would no longer write him for pain meds. Strongly encouraged to establish with PMD and Pain Management doctor for good continuity of care and control of his chronic pain.  Verb understanding.             Laray Anger, DO 02/21/13 [phone]

## 2013-02-18 NOTE — ED Notes (Signed)
Pt tolerating p.o intake.  No distress noted at this time.  Reporting some improvement in shoulder pain following morphine.

## 2013-04-10 ENCOUNTER — Emergency Department (HOSPITAL_COMMUNITY): Payer: Medicare Other

## 2013-04-10 ENCOUNTER — Emergency Department (HOSPITAL_COMMUNITY)
Admission: EM | Admit: 2013-04-10 | Discharge: 2013-04-10 | Disposition: A | Discharge status: Home / Self Care | Payer: Medicare Other | Attending: Emergency Medicine | Admitting: Emergency Medicine

## 2013-04-10 ENCOUNTER — Encounter (HOSPITAL_COMMUNITY): Payer: Self-pay | Admitting: Emergency Medicine

## 2013-04-10 DIAGNOSIS — Z8739 Personal history of other diseases of the musculoskeletal system and connective tissue: Secondary | ICD-10-CM | POA: Diagnosis not present

## 2013-04-10 DIAGNOSIS — I2510 Atherosclerotic heart disease of native coronary artery without angina pectoris: Secondary | ICD-10-CM | POA: Insufficient documentation

## 2013-04-10 DIAGNOSIS — F17200 Nicotine dependence, unspecified, uncomplicated: Secondary | ICD-10-CM | POA: Diagnosis not present

## 2013-04-10 DIAGNOSIS — Z9981 Dependence on supplemental oxygen: Secondary | ICD-10-CM | POA: Diagnosis not present

## 2013-04-10 DIAGNOSIS — Z9861 Coronary angioplasty status: Secondary | ICD-10-CM | POA: Insufficient documentation

## 2013-04-10 DIAGNOSIS — Z7982 Long term (current) use of aspirin: Secondary | ICD-10-CM | POA: Diagnosis not present

## 2013-04-10 DIAGNOSIS — K219 Gastro-esophageal reflux disease without esophagitis: Secondary | ICD-10-CM | POA: Diagnosis not present

## 2013-04-10 DIAGNOSIS — IMO0002 Reserved for concepts with insufficient information to code with codable children: Secondary | ICD-10-CM | POA: Insufficient documentation

## 2013-04-10 DIAGNOSIS — Z9889 Other specified postprocedural states: Secondary | ICD-10-CM | POA: Diagnosis not present

## 2013-04-10 DIAGNOSIS — E119 Type 2 diabetes mellitus without complications: Secondary | ICD-10-CM | POA: Insufficient documentation

## 2013-04-10 DIAGNOSIS — Z8701 Personal history of pneumonia (recurrent): Secondary | ICD-10-CM | POA: Insufficient documentation

## 2013-04-10 DIAGNOSIS — J438 Other emphysema: Secondary | ICD-10-CM | POA: Diagnosis not present

## 2013-04-10 DIAGNOSIS — M549 Dorsalgia, unspecified: Secondary | ICD-10-CM | POA: Insufficient documentation

## 2013-04-10 DIAGNOSIS — Z862 Personal history of diseases of the blood and blood-forming organs and certain disorders involving the immune mechanism: Secondary | ICD-10-CM | POA: Diagnosis not present

## 2013-04-10 DIAGNOSIS — Z8669 Personal history of other diseases of the nervous system and sense organs: Secondary | ICD-10-CM | POA: Diagnosis not present

## 2013-04-10 DIAGNOSIS — G8929 Other chronic pain: Secondary | ICD-10-CM | POA: Insufficient documentation

## 2013-04-10 DIAGNOSIS — Z79899 Other long term (current) drug therapy: Secondary | ICD-10-CM | POA: Diagnosis not present

## 2013-04-10 DIAGNOSIS — R0602 Shortness of breath: Secondary | ICD-10-CM | POA: Diagnosis not present

## 2013-04-10 DIAGNOSIS — J441 Chronic obstructive pulmonary disease with (acute) exacerbation: Secondary | ICD-10-CM | POA: Diagnosis not present

## 2013-04-10 DIAGNOSIS — R079 Chest pain, unspecified: Secondary | ICD-10-CM | POA: Diagnosis not present

## 2013-04-10 DIAGNOSIS — R5381 Other malaise: Secondary | ICD-10-CM | POA: Diagnosis not present

## 2013-04-10 MED ORDER — OXYMETAZOLINE HCL 0.05 % NA SOLN
1.0000 | Freq: Once | NASAL | Status: AC
  Administered 2013-04-10: 1 via NASAL
  Filled 2013-04-10: qty 15

## 2013-04-10 MED ORDER — ALBUTEROL SULFATE (5 MG/ML) 0.5% IN NEBU
5.0000 mg | INHALATION_SOLUTION | RESPIRATORY_TRACT | Status: AC
  Administered 2013-04-10: 5 mg via RESPIRATORY_TRACT
  Filled 2013-04-10: qty 1

## 2013-04-10 MED ORDER — PREDNISONE 20 MG PO TABS
40.0000 mg | ORAL_TABLET | Freq: Once | ORAL | Status: AC
  Administered 2013-04-10: 40 mg via ORAL
  Filled 2013-04-10: qty 2

## 2013-04-10 MED ORDER — NAPROXEN 500 MG PO TABS: 500.0000 mg | ORAL_TABLET | Freq: Two times a day (BID) | ORAL | Status: DC

## 2013-04-10 MED ORDER — NAPROXEN 250 MG PO TABS: 500.0000 mg | ORAL_TABLET | Freq: Once | ORAL | Status: DC

## 2013-04-10 MED ORDER — AZITHROMYCIN 250 MG PO TABS: 250.0000 mg | ORAL_TABLET | Freq: Every day | ORAL | Status: DC

## 2013-04-10 MED ORDER — PREDNISONE 20 MG PO TABS: 40.0000 mg | ORAL_TABLET | Freq: Every day | ORAL | Status: DC

## 2013-04-10 NOTE — ED Provider Notes (Signed)
CSN: [id number]     Arrival date & time 04/10/13  4098 History   First MD Initiated Contact with Patient 04/10/13 [phone]     Chief Complaint  Patient presents with  . Shortness of Breath  . Chest Pain  . 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  He is on home O2 and takes 3L by [place] constantly - he uses MDI at home but describes increased SOB over the last 3 days - increased cough with phlegm production as well.  He has chronic L sided CP which was documented well on prior notes from clinic and ED - with this but notes having R sided CP over the last few days - seems worse with coughing but is persistent.  It is a shar pain.  Denies swelling of legs.  He also complains of nasal congestion which is preventing him from breathing well at night through his nose.  Patient is a 52 y.o. male presenting with shortness of breath, chest pain, and back pain. The history is provided by the patient and medical records.  Shortness of Breath Associated symptoms: chest pain   Chest Pain Associated symptoms: back pain and shortness of breath   Back Pain Associated symptoms: chest pain     Past Medical History  Diagnosis Date  . Emphysema   . COPD (chronic obstructive pulmonary disease)   . Hypercholesteremia   . Pneumonia   . Polycythemia   . Coronary artery disease   . 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 Borderline diabetes   . Seizures     last seizure 2 yrs ago  . GERD (gastroesophageal reflux disease)   . Diabetes mellitus   . 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 Chronic back pain   . Bronchitis   . Chronic left shoulder pain   . Chronic neck pain    Past  Surgical History  Procedure Laterality Date  . Angioplasty    . Colonoscopy w/ endoscopic [place]    . Vasectomy    . Throat biopsy    . Lung biopsy    . Vasectomy  1987  . Cardiac surgery     Family History  Problem Relation Age of Onset  . Hypertension Mother   . Diabetes Mother   . Heart attack Mother   . Heart failure Mother   . Hypertension Sister   . Diabetes Sister   . Heart failure Sister    History  Substance Use Topics  . Smoking status: Current Every Day Smoker -- 1.00 packs/day for 45 years    Types: Cigarettes  . Smokeless tobacco: Former Neurosurgeon  . Alcohol Use: No    Review of Systems  Respiratory: Positive for shortness of breath.   Cardiovascular: Positive for chest pain.  Musculoskeletal: Positive for back pain.  All other systems reviewed and are negative.    Allergies  Influenza vaccine live  Home Medications   Current Outpatient Rx  Name  Route  Sig  Dispense  Refill  . albuterol (VENTOLIN HFA) 108 (90 BASE) MCG/ACT inhaler   Inhalation   Inhale 2 puffs into the lungs every 4 (four) hours as  needed. Shortness of breath         . aspirin EC 81 MG tablet   Oral   Take 81 mg by mouth daily.         . Fluticasone-Salmeterol (ADVAIR DISKUS) 250-50 MCG/DOSE AEPB   Inhalation   Inhale 1 puff into the lungs every 12 (twelve) hours.          [name] azithromycin (ZITHROMAX Z-PAK) 250 MG tablet   Oral   Take 1 tablet (250 mg total) by mouth daily. 500mg  PO day 1, then 250mg  PO days 205   6 tablet   0   . naproxen (NAPROSYN) 250 MG tablet   Oral   Take 1 tablet (250 mg total) by mouth 2 (two) times daily with a meal.   14 tablet   0   . naproxen (NAPROSYN) 500 MG tablet   Oral   Take 1 tablet (500 mg total) by mouth 2 (two) times daily with a meal.   30 tablet   0   . oxyCODONE-acetaminophen (PERCOCET/ROXICET) 5-325 MG per tablet      1 or 2 tabs PO q6h prn pain   25 tablet   0   . predniSONE (DELTASONE) 20 MG tablet   Oral   Take 2  tablets (40 mg total) by mouth daily.   10 tablet   0   . tiotropium (SPIRIVA) 18 MCG inhalation capsule   Inhalation   Place 18 mcg into inhaler and inhale daily.          BP 136/88  Pulse 74  Temp(Src) 97.7 F (36.5 C) (Oral)  Resp 20  Ht 5\' 8"  (1.727 m)  Wt 140 lb (63.504 kg)  BMI 21.29 kg/m2  SpO2 96% Physical Exam  Nursing note and vitals reviewed. Constitutional: He appears well-developed and well-nourished. No distress.  HENT:  Head: Normocephalic and atraumatic.  Mouth/Throat: Oropharynx is clear and moist. No oropharyngeal exudate.  Nasal congestion bilaterally - no d/c,able to breathe through nose.  Eyes: Conjunctivae and EOM are normal. Pupils are equal, round, and reactive to light. Right eye exhibits no discharge. Left eye exhibits no discharge. No scleral icterus.  Neck: Normal range of motion. Neck supple. No JVD present. No thyromegaly present.  No LAD  Cardiovascular: Normal rate, regular rhythm, normal heart sounds and intact distal pulses.  Exam reveals no gallop and no friction rub.   No murmur heard. Pulse in the 70's, normal peripheral pulses.  Pulmonary/Chest: Effort normal. No respiratory distress. He has wheezes ( diffuse expiratory wheezing, prolonged expiratory phase, no accessory muscle use.). He has no rales.  Abdominal: Soft. Bowel sounds are normal. He exhibits no distension and no mass. There is no tenderness.  Musculoskeletal: Normal range of motion. He exhibits no edema and no tenderness.  Lymphadenopathy:    He has no cervical adenopathy.  Neurological: He is alert. Coordination normal.  Skin: Skin is warm and dry. No rash noted. No erythema.  Psychiatric: He has a normal mood and affect. His behavior is normal.    ED Course  Procedures (including critical care time) Labs Review Labs Reviewed - No data to display Imaging Review Dg Chest 2 View  04/10/2013   CLINICAL DATA:  Short of breath. Weakness. History of COPD.  EXAM: CHEST  2  VIEW  COMPARISON:  Plain film 02/17/2013 and CT of 12/21/2012.  FINDINGS: Hyperinflation secondary to COPD/emphysema.  Midline trachea. Normal heart size. Left suprahilar soft tissue fullness is unchanged and likely related to  adenopathy as detailed on prior CT. No pleural effusion or pneumothorax. Similar appearance of cavitary lesion involving the left upper lobe. No acute superimposed airspace disease.  IMPRESSION: Cavitary left upper lobe lung lesion with left suprahilar adenopathy. Grossly similar to 02/17/2013 and the CT of 12/21/2012. Please see that CT report for further discussion.  Underlying COPD/ emphysema.  No superimposed acute process.   Electronically Signed   By: Jeronimo Greaves   On: 04/10/2013 05:33    MDM   1. COPD exacerbation    The pt has evidence of COPD exacerbation or PNA - he will receive a CXR - hold labs at this time as he does not look like he is in sig distress.  His VS show sats of 97%, no tachycardia, fever or hypotension.  Albuterol neb, prednisone.  Anticipate need for antibiotics. Nasal decongestant as well.  ED ECG REPORT  I personally interpreted this EKG   Date: 04/10/2013   Rate: 73  Rhythm: normal sinus rhythm  QRS Axis: normal  Intervals: normal  ST/T Wave abnormalities: normal  Conduction Disutrbances:none  Narrative Interpretation:   Old EKG Reviewed: Compared with 02/17/2013, no significant changes are seen  5:15 AM, patient reevaluated and has improved lung sounds with less wheezing, still appears comfortable, chest x-ray without any new acute findings, old findings are persistently seen but unchanged. The patient has been given Afrin for nasal congestion and has been instructed on how to use this and how often, has expressed his understanding to the need for medications below.   Meds given in ED:  Medications  naproxen (NAPROSYN) tablet 500 mg (not administered)  albuterol (PROVENTIL) (5 MG/ML) 0.5% nebulizer solution 5 mg (5 mg Nebulization  Given 04/10/13 0419)  predniSONE (DELTASONE) tablet 40 mg (40 mg Oral Given 04/10/13 0436)  oxymetazoline (AFRIN) 0.05 % nasal spray 1 spray (1 spray Each Nare Given 04/10/13 0436)    New Prescriptions   AZITHROMYCIN (ZITHROMAX Z-PAK) 250 MG TABLET    Take 1 tablet (250 mg total) by mouth daily. 500mg  PO day 1, then 250mg  PO days 205   NAPROXEN (NAPROSYN) 500 MG TABLET    Take 1 tablet (500 mg total) by mouth 2 (two) times daily with a meal.   PREDNISONE (DELTASONE) 20 MG TABLET    Take 2 tablets (40 mg total) by mouth daily.      Vida Roller, MD 04/10/13 [phone]

## 2013-04-10 NOTE — ED Notes (Signed)
Xray called to let them know that pt was ready for xray.

## 2013-04-10 NOTE — ED Notes (Signed)
Pt reports waking up the past three mornings "feeling like I can't breathe", c/o right sided rib pain extending into the central chest and into the back between the his shoulder blades.

## 2013-04-10 NOTE — ED Notes (Signed)
Pt is on 3L continuous O2 at home.  Also reports cough and runny nose over the past few days.

## 2013-04-22 ENCOUNTER — Encounter: Payer: Self-pay | Admitting: *Deleted

## 2013-04-22 ENCOUNTER — Ambulatory Visit (INDEPENDENT_AMBULATORY_CARE_PROVIDER_SITE_OTHER): Payer: Medicare Other | Admitting: Adult Health

## 2013-04-22 ENCOUNTER — Encounter: Payer: Self-pay | Admitting: Adult Health

## 2013-04-22 VITALS — BP 119/76 | HR 78 | Ht 68.0 in | Wt 139.0 lb

## 2013-04-22 DIAGNOSIS — I4891 Unspecified atrial fibrillation: Secondary | ICD-10-CM | POA: Diagnosis not present

## 2013-04-22 DIAGNOSIS — F17200 Nicotine dependence, unspecified, uncomplicated: Secondary | ICD-10-CM

## 2013-04-22 DIAGNOSIS — Z720 Tobacco use: Secondary | ICD-10-CM

## 2013-04-22 DIAGNOSIS — R079 Chest pain, unspecified: Secondary | ICD-10-CM

## 2013-04-22 DIAGNOSIS — I7090 Unspecified atherosclerosis: Secondary | ICD-10-CM

## 2013-04-22 DIAGNOSIS — I2510 Atherosclerotic heart disease of native coronary artery without angina pectoris: Secondary | ICD-10-CM | POA: Diagnosis not present

## 2013-04-22 MED ORDER — NITROGLYCERIN 0.4 MG SL SUBL: 0.4000 mg | SUBLINGUAL_TABLET | SUBLINGUAL | Status: AC | PRN

## 2013-04-22 NOTE — Assessment & Plan Note (Signed)
He is having recurrent chest pain but has normal EKG. He will be scheduled for lexiscan stress myoiew as he is unable to walk on treadmill due to breathing issues and chronic back pain. Last stress test  Has been over 3 years ago [place]. He continues to smoke heavily, which predisposes him for increased risk of progressive CAD

## 2013-04-22 NOTE — Assessment & Plan Note (Signed)
Remains in NSR. He is on ASA only. Rate is controlled without AV nodal blocking agents.

## 2013-04-22 NOTE — Patient Instructions (Signed)
Your physician recommends that you schedule a follow-up appointment in: Post stress test  Your physician has recommended you make the following change in your medication:  1. Use Nitro when needed  The proper use and anticipated side effects of nitroglycerine has been carefully explained.  If a single episode of chest pain is not relieved by one tablet, the patient will try another within 5 minutes; and if this doesn't relieve the pain, the patient is instructed to call 911 for transportation to an emergency department.  Your physician has requested that you have a lexiscan myoview. For further information please visit [email or web address] Please follow instruction sheet, as given.

## 2013-04-22 NOTE — Progress Notes (Signed)
HPI: [name] is a 52 year old former patient Dr. Dietrich Pates we are seeing for ongoing assessment and management of atrial fibrillation, with history of COPD, ongoing tobacco abuse, CAD. The patient is not on any anticoagulation, as he has been in normal sinus rhythm with occasional arrhythmias. He seen on an annual basis. He was last seen in the office in August of 2013 and was stable. No changes were made in his medication regimen.     He has had recent ER visit for chest pain, thought to be related to emphysema. He unfortunately continues to smoke.           Allergies  Allergen Reactions  . Influenza Vaccine Live Swelling    Current Outpatient Prescriptions  Medication Sig Dispense Refill  . albuterol (VENTOLIN HFA) 108 (90 BASE) MCG/ACT inhaler Inhale 2 puffs into the lungs every 4 (four) hours as needed. Shortness of breath      . aspirin EC 81 MG tablet Take 81 mg by mouth daily.      . Fluticasone-Salmeterol (ADVAIR DISKUS) 250-50 MCG/DOSE AEPB Inhale 1 puff into the lungs every 12 (twelve) hours.       . naproxen (NAPROSYN) 250 MG tablet Take 1 tablet (250 mg total) by mouth 2 (two) times daily with a meal.  14 tablet  0  . naproxen (NAPROSYN) 500 MG tablet Take 1 tablet (500 mg total) by mouth 2 (two) times daily with a meal.  30 tablet  0  . oxyCODONE-acetaminophen (PERCOCET/ROXICET) 5-325 MG per tablet 1 or 2 tabs PO q6h prn pain  25 tablet  0  . predniSONE (DELTASONE) 20 MG tablet Take 2 tablets (40 mg total) by mouth daily.  10 tablet  0  . tiotropium (SPIRIVA) 18 MCG inhalation capsule Place 18 mcg into inhaler and inhale daily.       No current facility-administered medications for this visit.    Past Medical History  Diagnosis Date  . Emphysema   . COPD (chronic obstructive pulmonary disease)   . Hypercholesteremia   . Pneumonia   . Polycythemia   . Coronary artery disease   . 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 Borderline  diabetes   . Seizures     last seizure 2 yrs ago  . GERD (gastroesophageal reflux disease)   . Diabetes mellitus   . 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 Chronic back pain   . Bronchitis   . Chronic left shoulder pain   . Chronic neck pain     Past Surgical History  Procedure Laterality Date  . Angioplasty    . Colonoscopy w/ endoscopic [place]    . Vasectomy    . Throat biopsy    . Lung biopsy    . Vasectomy  1987  . Cardiac surgery      ROS: Review of systems complete and found to be negative unless listed above  PHYSICAL EXAM BP 119/76  Pulse 78  Ht 5\' 8"  (1.727 m)  Wt 139 lb (63.05 kg)  BMI 21.14 kg/m2  General: Well developed, well nourished, in no acute distress Head: Eyes PERRLA, No xanthomas.   Normal cephalic and atramatic  Lungs: Clear bilaterally to auscultation prolonged expiratory effort. No wheezes. Heart: HRRR S1 S2, without MRG.  Pulses are 2+ & equal.            No carotid bruit. No JVD.  No abdominal  bruits. No femoral bruits. Abdomen: Bowel sounds are positive, abdomen soft and non-tender without masses or                  Hernia's noted. Msk:  Back normal, normal gait. Normal strength and tone for age. Extremities: No clubbing, cyanosis or edema.  DP +1 Neuro: Alert and oriented X 3. Psych:  Good affect, responds appropriately  EKG: NSR rate of 73 bpm.  ASSESSMENT AND PLAN

## 2013-04-22 NOTE — Progress Notes (Deleted)
Name: Ryan [name]    DOB: Nov 27, 1960  Age: 52 y.o.  MR#: [id number]       PCP:  Frazier Richards, PA-C      Insurance: Payor: MEDICARE / Plan: MEDICARE PART A AND B / Product Type: *No Product type* /   CC:    Chief Complaint  Patient presents with  . Atrial Fibrillation  . Coronary Artery Disease    VS Filed Vitals:   04/22/13 1355  BP: 119/76  Pulse: 78  Height: 5\' 8"  (1.727 m)  Weight: 139 lb (63.05 kg)    Weights Current Weight  04/22/13 139 lb (63.05 kg)  04/10/13 140 lb (63.504 kg)  02/17/13 140 lb (63.504 kg)    Blood Pressure  BP Readings from Last 3 Encounters:  04/22/13 119/76  04/10/13 136/88  02/18/13 111/74     Admit date:  (Not on file) Last encounter with RMR:  Visit date not found   Allergy Influenza vaccine live  Current Outpatient Prescriptions  Medication Sig Dispense Refill  . albuterol (VENTOLIN HFA) 108 (90 BASE) MCG/ACT inhaler Inhale 2 puffs into the lungs every 4 (four) hours as needed. Shortness of breath      . aspirin EC 81 MG tablet Take 81 mg by mouth daily.      . Fluticasone-Salmeterol (ADVAIR DISKUS) 250-50 MCG/DOSE AEPB Inhale 1 puff into the lungs every 12 (twelve) hours.       [name] tiotropium (SPIRIVA) 18 MCG inhalation capsule Place 18 mcg into inhaler and inhale daily.       No current facility-administered medications for this visit.    Discontinued Meds:    Medications Discontinued During This Encounter  Medication Reason  . azithromycin (ZITHROMAX Z-PAK) 250 MG tablet Error  . naproxen (NAPROSYN) 250 MG tablet Error  . naproxen (NAPROSYN) 500 MG tablet Error  . oxyCODONE-acetaminophen (PERCOCET/ROXICET) 5-325 MG per tablet Error  . predniSONE (DELTASONE) 20 MG tablet Error    Patient Active Problem List   Diagnosis Date Noted  . Shoulder pain, left 01/20/2012  . Neck pain on left side 01/20/2012  . Rotator cuff syndrome of left shoulder 01/20/2012  . Spondylosis, cervical 11/19/2011  . Shoulder pain 11/19/2011  .  Acute respiratory failure with hypoxia 06/23/2011  . Pulmonary cavitary lesion 06/23/2011  . SOB (shortness of breath) 06/22/2011  . Nausea vomiting and diarrhea 06/22/2011  . Dehydration 06/22/2011  . Cough 06/22/2011  . Decompensated COPD with exacerbation (chronic obstructive pulmonary disease) 06/22/2011  . Abdominal pain 06/22/2011  . 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 Cavitary lesion of lung 05/07/2011  . GERD with stricture   . ASCVD (arteriosclerotic cardiovascular disease)   . Glucose intolerance (pre-diabetes)   . TOBACCO ABUSE 05/17/2010  . ASTHMA 01/20/2008  . HYPERLIPIDEMIA 12/27/2007  . ATRIAL FIBRILLATION 12/27/2007  . COPD 12/27/2007    LABS    Component Value Date/Time   NA 135 02/17/2013 2337   NA 136 01/18/2013 1814   NA 135 10/27/2012 2134   K 4.1 02/17/2013 2337   K 3.9 01/18/2013 1814   K 4.3 10/27/2012 2134   CL 99 02/17/2013 2337   CL 100 01/18/2013 1814   CL 98 10/27/2012 2134   CO2 27 02/17/2013 2337   CO2 26 01/18/2013 1814   CO2 26 10/27/2012 2134   GLUCOSE 96 02/17/2013 2337   GLUCOSE 83 01/18/2013 1814   GLUCOSE 114* 10/27/2012 2134   BUN 14 02/17/2013 2337   BUN 13 01/18/2013 1814  BUN 19 10/27/2012 2134   CREATININE 1.00 02/17/2013 2337   CREATININE 1.18 01/18/2013 1814   CREATININE 1.11 10/27/2012 2134   CALCIUM 9.0 02/17/2013 2337   CALCIUM 9.3 01/18/2013 1814   CALCIUM 8.8 10/27/2012 2134   GFRNONAA 83* 02/17/2013 2337   GFRNONAA 68* 01/18/2013 1814   GFRNONAA 74* 10/27/2012 2134   GFRAA >90 02/17/2013 2337   GFRAA 79* 01/18/2013 1814   GFRAA 86* 10/27/2012 2134   CMP     Component Value Date/Time   NA 135 02/17/2013 2337   K 4.1 02/17/2013 2337   CL 99 02/17/2013 2337   CO2 27 02/17/2013 2337   GLUCOSE 96 02/17/2013 2337   BUN 14 02/17/2013 2337   CREATININE 1.00 02/17/2013 2337   CALCIUM 9.0 02/17/2013 2337   PROT 7.4 01/18/2013 1814   ALBUMIN 3.4* 01/18/2013 1814   AST 17 01/18/2013 1814   ALT 16 01/18/2013 1814   ALKPHOS 81 01/18/2013 1814   BILITOT 0.2* 01/18/2013  1814   GFRNONAA 83* 02/17/2013 2337   GFRAA >90 02/17/2013 2337       Component Value Date/Time   WBC 9.6 02/17/2013 2337   WBC 8.1 01/18/2013 1814   WBC 8.5 10/27/2012 2134   HGB 16.7 02/17/2013 2337   HGB 16.8 01/18/2013 1814   HGB 16.0 10/27/2012 2134   HCT 47.6 02/17/2013 2337   HCT 48.0 01/18/2013 1814   HCT 46.1 10/27/2012 2134   MCV 86.2 02/17/2013 2337   MCV 85.9 01/18/2013 1814   MCV 83.7 10/27/2012 2134    Lipid Panel     Component Value Date/Time   CHOL 131 06/23/2011 0555   TRIG 83 06/23/2011 0555   HDL 29* 06/23/2011 0555   CHOLHDL 4.5 06/23/2011 0555   VLDL 17 06/23/2011 0555   LDLCALC 85 06/23/2011 0555   LDLCALC 101 05/17/2010 0000    ABG    Component Value Date/Time   PHART 7.398 03/12/2011 1220   PCO2ART 36.8 03/12/2011 1220   PO2ART 64.9* 03/12/2011 1220   HCO3 22.2 03/12/2011 1220   TCO2 25 06/22/2011 0414   ACIDBASEDEF 1.9 03/12/2011 1220   O2SAT 92.4 03/12/2011 1220     Lab Results  Component Value Date   TSH 4.945 ***Test methodology is 3rd generation TSH**** 12/05/2008   BNP (last 3 results) No results found for this basename: PROBNP,  in the last 8760 hours Cardiac Panel (last 3 results) No results found for this basename: CKTOTAL, CKMB, TROPONINI, RELINDX,  in the last 72 hours  Iron/TIBC/Ferritin No results found for this basename: iron, tibc, ferritin     EKG Orders placed during the hospital encounter of 04/10/13  . EKG     Prior Assessment and Plan Problem List as of 04/22/2013     Cardiovascular and Mediastinum   ATRIAL FIBRILLATION   Last Assessment & Plan   03/03/2012 Office Visit Written 03/03/2012  4:20 PM by Jodelle Gross, NP     This has been quiesced and. Review of his last EKG reveals normal sinus rhythm with occasional arrhythmia. Heart is currently well controlled and regular. He will continue on aspirin daily. We will see him again in one year unless he becomes symptomatic.    ASCVD (arteriosclerotic cardiovascular disease)   Last  Assessment & Plan   03/03/2012 Office Visit Written 03/03/2012  4:21 PM by Jodelle Gross, NP     He is without complaints of chest pain, dizziness, or diaphoresis with exertion. He unfortunately continues to smoke. I  discussed with him the need to stop smoking as this will increase his risk for MI. He verbalizes understanding.      Respiratory   ASTHMA   COPD   Decompensated COPD with exacerbation (chronic obstructive pulmonary disease)   Acute respiratory failure with hypoxia     Digestive   GERD with stricture   Nausea vomiting and diarrhea     Endocrine   Glucose intolerance (pre-diabetes)     Musculoskeletal and Integument   Spondylosis, cervical   Rotator cuff syndrome of left shoulder     Other   HYPERLIPIDEMIA   Last Assessment & Plan   03/03/2012 Office Visit Written 03/03/2012  4:22 PM by Jodelle Gross, NP     He is followed by his primary care physician Dr. Modesto Charon for continued labs, We will request recent results.    TOBACCO ABUSE   Last Assessment & Plan   07/09/2011 Office Visit Written 07/09/2011  2:24 PM by Jodelle Gross, NP     He has stopped smoking for 2 weeks. He states he just does not want to smoke anymore. They don't taste good to him. I have congratulated him on his cessation and encouraged him to continue this.    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 More recommendations once his biopsy has returned.    SOB (shortness of breath)   Dehydration   Cough   Abdominal pain   Pulmonary cavitary lesion   Shoulder pain   Shoulder pain, left   Neck pain on left side       Imaging: Dg Chest 2 View  04/10/2013   CLINICAL DATA:  Short of breath. Weakness. History of COPD.  EXAM: CHEST  2 VIEW  COMPARISON:  Plain film 02/17/2013 and CT of 12/21/2012.   FINDINGS: Hyperinflation secondary to COPD/emphysema.  Midline trachea. Normal heart size. Left suprahilar soft tissue fullness is unchanged and likely related to adenopathy as detailed on prior CT. No pleural effusion or pneumothorax. Similar appearance of cavitary lesion involving the left upper lobe. No acute superimposed airspace disease.  IMPRESSION: Cavitary left upper lobe lung lesion with left suprahilar adenopathy. Grossly similar to 02/17/2013 and the CT of 12/21/2012. Please see that CT report for further discussion.  Underlying COPD/ emphysema.  No superimposed acute process.   Electronically Signed   By: Jeronimo Greaves   On: 04/10/2013 05:33

## 2013-05-04 ENCOUNTER — Encounter (HOSPITAL_COMMUNITY)
Admission: RE | Admit: 2013-05-04 | Discharge: 2013-05-04 | Disposition: A | Discharge status: Home / Self Care | Payer: Medicare Other | Source: Ambulatory Visit | Attending: Adult Health | Admitting: Adult Health

## 2013-05-04 ENCOUNTER — Encounter (HOSPITAL_COMMUNITY): Payer: Self-pay

## 2013-05-04 ENCOUNTER — Encounter (HOSPITAL_COMMUNITY)
Admission: RE | Admit: 2013-05-04 | Discharge: 2013-05-04 | Disposition: A | Discharge status: Home / Self Care | Payer: Medicare Other | Source: Ambulatory Visit | Attending: Cardiology | Admitting: Cardiology

## 2013-05-04 DIAGNOSIS — R079 Chest pain, unspecified: Secondary | ICD-10-CM | POA: Diagnosis not present

## 2013-05-04 DIAGNOSIS — F17200 Nicotine dependence, unspecified, uncomplicated: Secondary | ICD-10-CM | POA: Insufficient documentation

## 2013-05-04 DIAGNOSIS — I2510 Atherosclerotic heart disease of native coronary artery without angina pectoris: Secondary | ICD-10-CM | POA: Diagnosis not present

## 2013-05-04 DIAGNOSIS — I4891 Unspecified atrial fibrillation: Secondary | ICD-10-CM

## 2013-05-04 DIAGNOSIS — Z720 Tobacco use: Secondary | ICD-10-CM

## 2013-05-04 MED ORDER — TECHNETIUM TC 99M SESTAMIBI - CARDIOLITE
10.0000 | Freq: Once | INTRAVENOUS | Status: AC | PRN
  Administered 2013-05-04: 10 via INTRAVENOUS

## 2013-05-04 MED ORDER — REGADENOSON 0.4 MG/5ML IV SOLN
INTRAVENOUS | Status: AC
  Administered 2013-05-04: 0.4 mg via INTRAVENOUS
  Filled 2013-05-04: qty 5

## 2013-05-04 MED ORDER — TECHNETIUM TC 99M TETROFOSMIN IV KIT: 30.0000 | PACK | Freq: Once | INTRAVENOUS | Status: DC | PRN

## 2013-05-04 MED ORDER — SODIUM CHLORIDE 0.9 % IJ SOLN
INTRAMUSCULAR | Status: AC
  Administered 2013-05-04: 10 mL via INTRAVENOUS
  Filled 2013-05-04: qty 10

## 2013-05-04 MED ORDER — TECHNETIUM TC 99M SESTAMIBI - CARDIOLITE
30.0000 | Freq: Once | INTRAVENOUS | Status: AC | PRN
  Administered 2013-05-04: 30 via INTRAVENOUS

## 2013-05-04 NOTE — Progress Notes (Signed)
Stress Lab Nurses Notes - Orla Jolliff Ozier 05/04/2013 Reason for doing test: Chest Pain and AFib Type of test: Marlane Hatcher Nurse performing test: Parke Poisson, RN Nuclear Medicine Tech: Lyndel Pleasure Echo Tech: Not Applicable MD performing test: Francesco Sor Family MD: Allayne Butcher PA Test explained and consent signed: yes IV started: 22g jelco, Saline lock flushed, No redness or edema and Saline lock started in radiology Symptoms: SOB Treatment/Intervention: O2 started @ 2L via Nasal Canule during stress test, then stopped. Reason test stopped: protocol completed After recovery IV was: Discontinued via X-ray tech and No redness or edema Patient to return to Nuc. Med at : 12:15 Patient discharged: Home Patient's Condition upon discharge was: stable Comments: During test BP 143/79 & HR 137.  Recovery BP 102/72 & HR 89.  Symptoms resolved in recovery. O2 stopped, no SOB during recovery. Erskine Speed T

## 2013-05-05 ENCOUNTER — Telehealth: Payer: Self-pay | Admitting: *Deleted

## 2013-05-05 NOTE — Telephone Encounter (Signed)
Called pt to let him know of negative stress. Pt understood.  Pt wants to know if he can get a letter so he does not have to go to jury duty on Nov 1. Please advise.

## 2013-05-06 IMAGING — CT CT CHEST W/O CM
3 of 4 series · 17 of 30 positions shown, 19 images · non-contrast
Comparison: CT chest of 11/12/2011

CLINICAL DATA: History of cavitary left upper lobe lesion, biopsy
in May 2011, follow-up

CT CHEST WITHOUT CONTRAST
TECHNIQUE: Multidetector CT imaging of the chest was performed
following the standard protocol without IV contrast.

[Series 3: chest w/o · axial · non-contrast · 0.70mm/px · z∈[-227,-32]mm · 4 of 67 slices shown]
[im 14/67  lung]
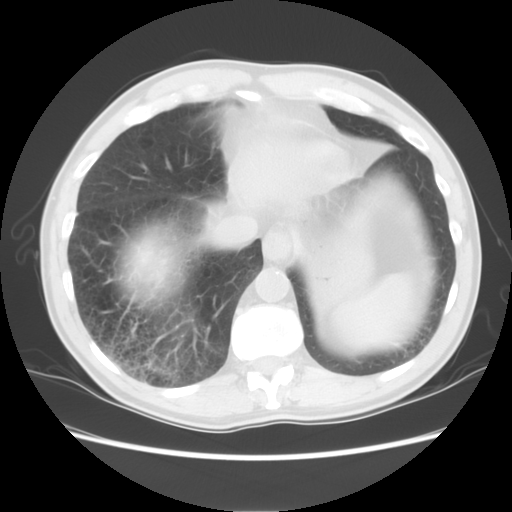
[im 27/67  lung]
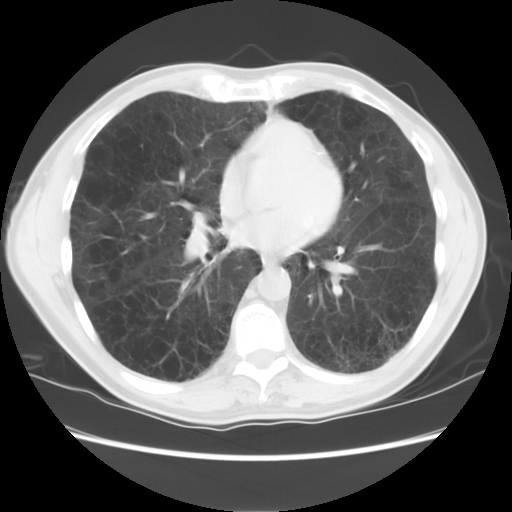
[im 40/67  lung]
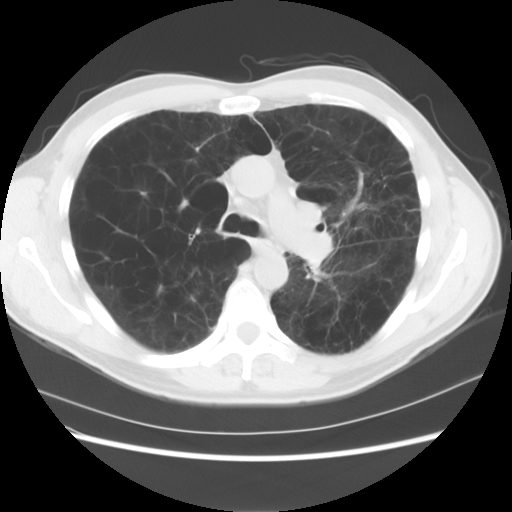
[im 53/67  lung]
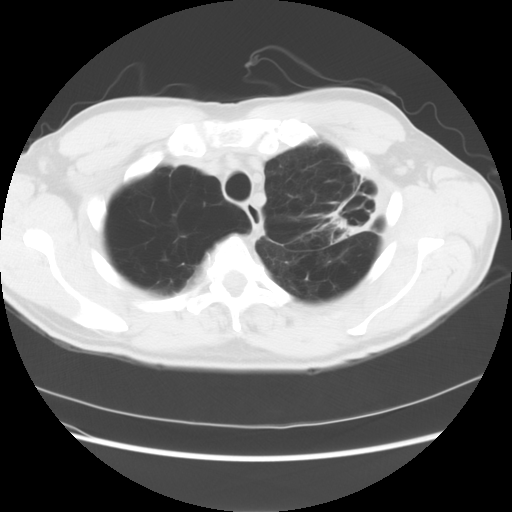

[Series 4: lung windows · axial · 0.70mm/px · z∈[-237,-17]mm · 5 of 67 slices shown, 7 images]
[im 12/67  mediastinal]
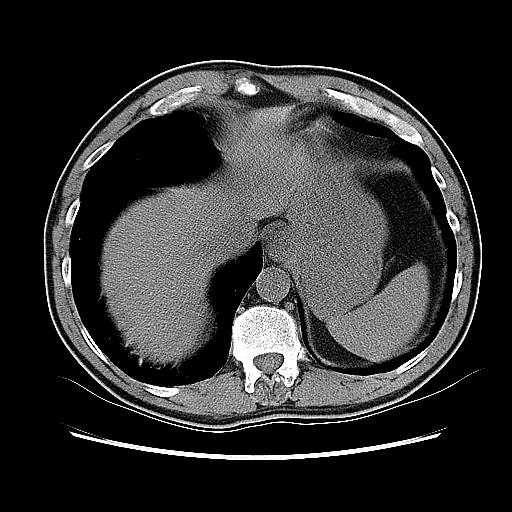
[im 12/67  lung]
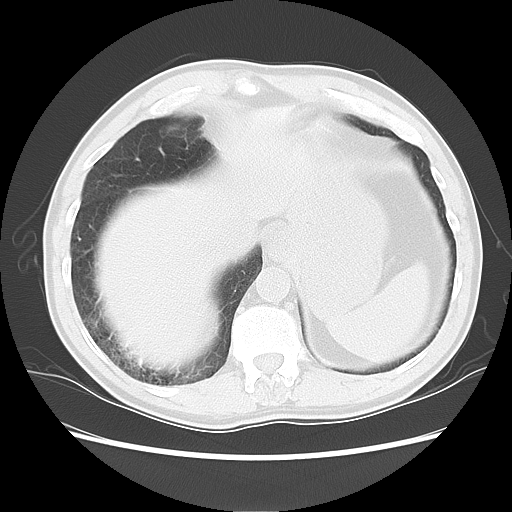
[im 23/67  lung]
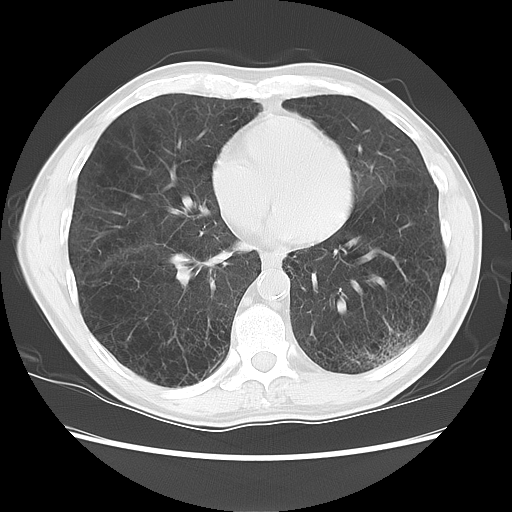
[im 34/67  lung]
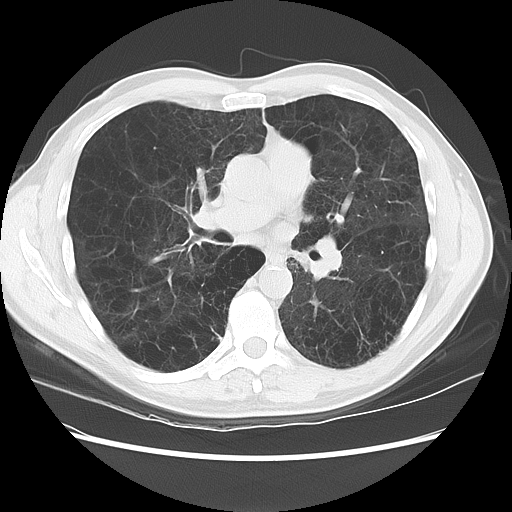
[im 45/67  lung]
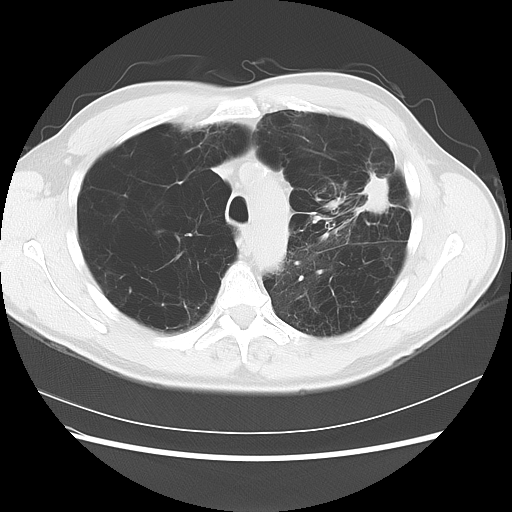
[im 56/67  mediastinal]
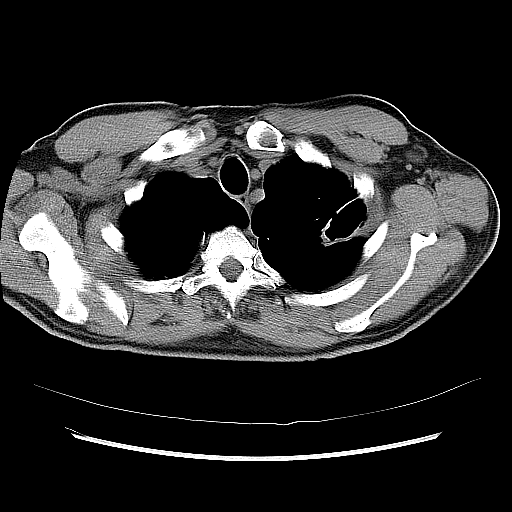
[im 56/67  lung]
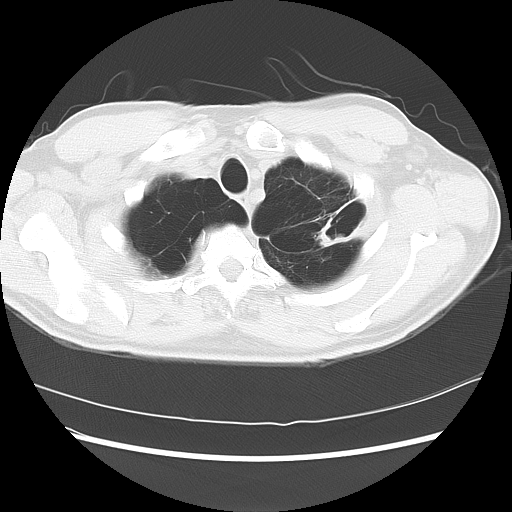

[Series 602: sagittal body · sagittal · 0.70mm/px · 8 of 145 slices shown]
[im 11/145  mediastinal]
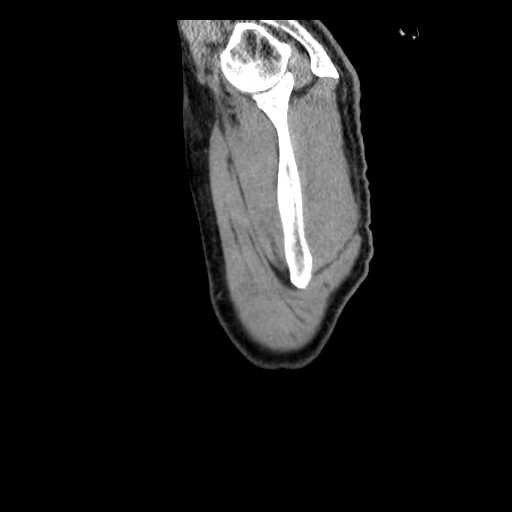
[im 31/145  mediastinal]
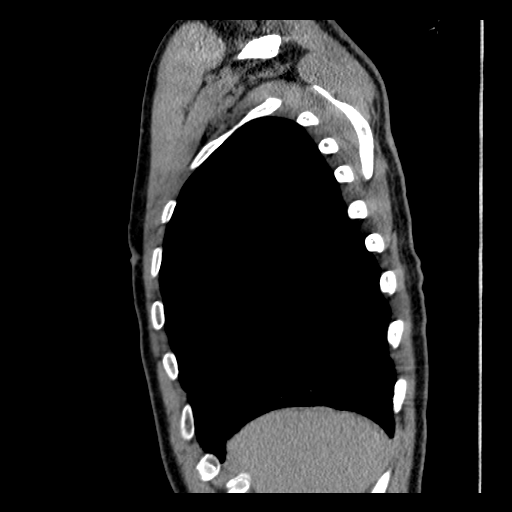
[im 52/145  mediastinal]
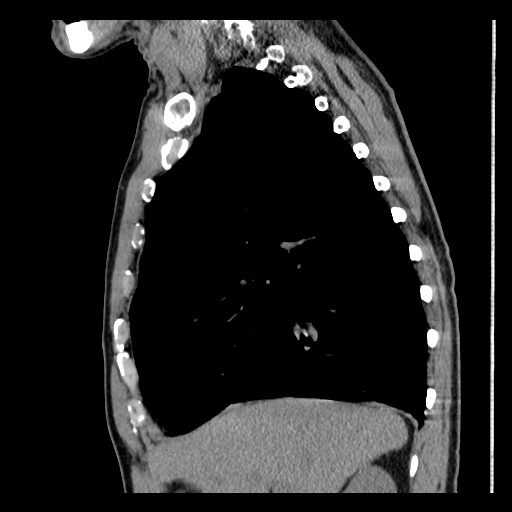
[im 62/145  mediastinal]
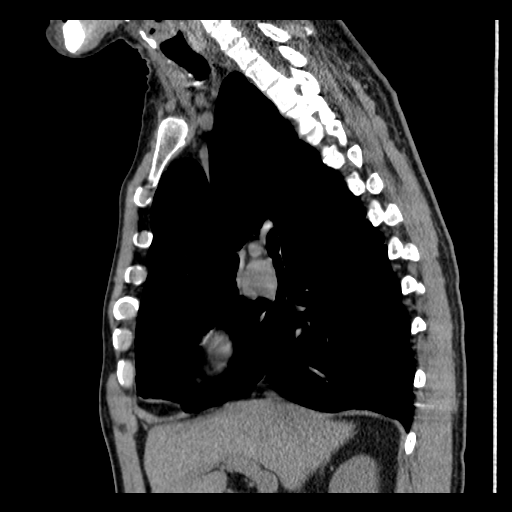
[im 83/145  mediastinal]
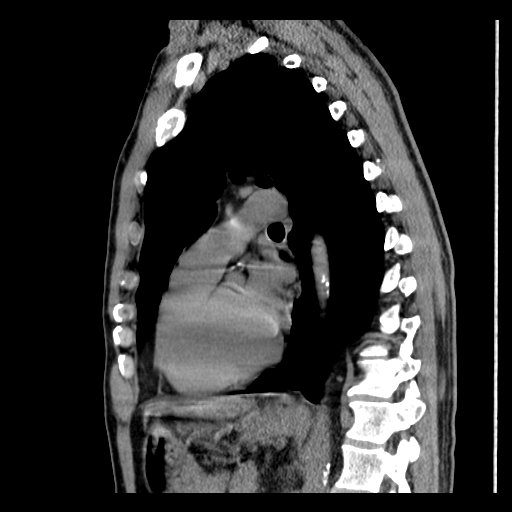
[im 93/145  mediastinal]
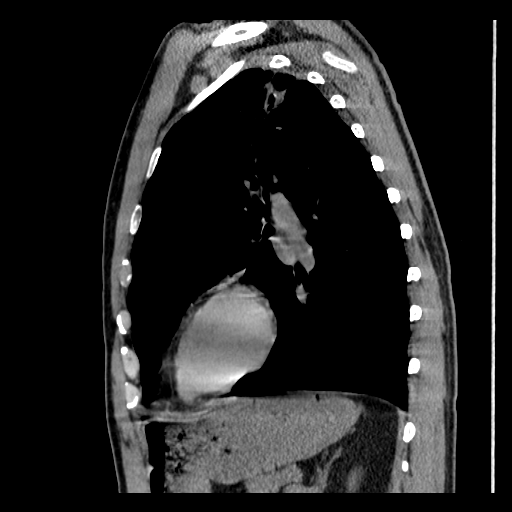
[im 114/145  mediastinal]
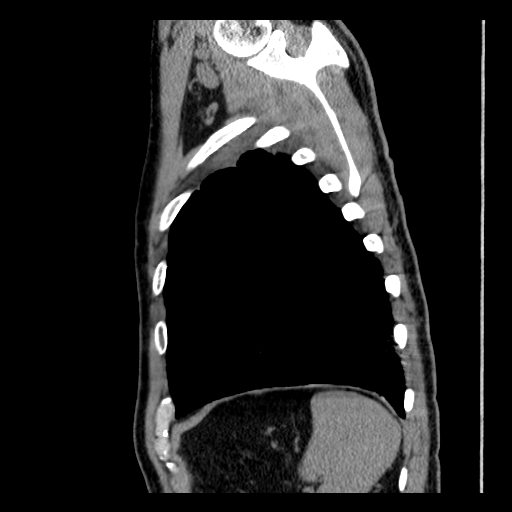
[im 134/145  mediastinal]
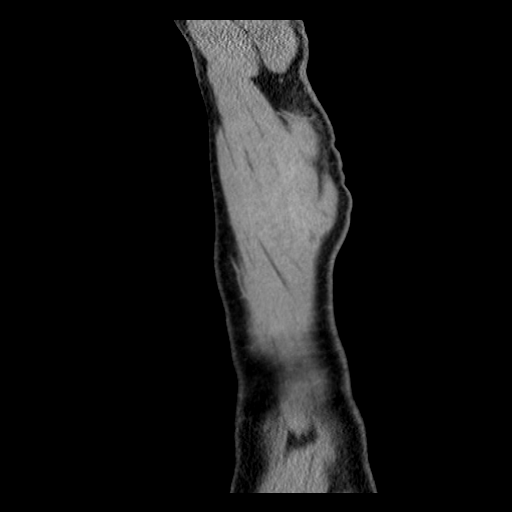

[17 of 30 positions shown; findings below may reference images not displayed]

FINDINGS: The cavitary lesion noted within the periphery of the
left upper lobe has further decreased in size.  The soft tissue
mass at the base of this cavity has decreased in size, with
measurements at the comparable level now being 2.9 x 1.9 cm
compared to 3.3 x 2.0 cm previously.  No new parenchymal lesion is
seen.  Diffuse severe changes of centrilobular emphysema are noted.
A small nodule within the lingula appears stable.  No pleural
effusion is noted.  The airway appears patent.

On soft tissue window images, the thyroid gland is stable.  On this
unenhanced study, no mediastinal or hilar adenopathy is noted with
small mediastinal nodes again present.  Coronary artery
calcifications again are noted.  No abnormality is seen within the
upper abdomen.  No bony abnormality is noted.
IMPRESSION: 1.  Further decrease in size of the cavitary lesion as well as the
associated soft tissue component of this lesion within the left
upper lobe.
2.  Severe centrilobular emphysema.
3.  Coronary artery calcifications.

## 2013-05-06 NOTE — Telephone Encounter (Signed)
Spoke to patient concerning lab/test results/instructions from provider. Patient understood.    

## 2013-05-06 NOTE — Telephone Encounter (Signed)
No cardiac reason to keep him from [place] duty. He will have to get letter from other provider, possibly PCP

## 2013-05-18 ENCOUNTER — Ambulatory Visit: Payer: Medicare Other | Admitting: Cardiology

## 2013-05-24 ENCOUNTER — Ambulatory Visit (INDEPENDENT_AMBULATORY_CARE_PROVIDER_SITE_OTHER): Payer: Medicare Other | Admitting: Adult Health

## 2013-05-24 ENCOUNTER — Encounter: Payer: Self-pay | Admitting: Adult Health

## 2013-05-24 VITALS — BP 102/62 | HR 89 | Ht 68.0 in | Wt 159.0 lb

## 2013-05-24 DIAGNOSIS — E785 Hyperlipidemia, unspecified: Secondary | ICD-10-CM

## 2013-05-24 DIAGNOSIS — I2510 Atherosclerotic heart disease of native coronary artery without angina pectoris: Secondary | ICD-10-CM

## 2013-05-24 DIAGNOSIS — F17200 Nicotine dependence, unspecified, uncomplicated: Secondary | ICD-10-CM | POA: Diagnosis not present

## 2013-05-24 DIAGNOSIS — I7090 Unspecified atherosclerosis: Secondary | ICD-10-CM | POA: Diagnosis not present

## 2013-05-24 NOTE — Assessment & Plan Note (Signed)
He refuses labs at this time due to financial constraints. He is to find PCP as soon as possible.,

## 2013-05-24 NOTE — Patient Instructions (Addendum)
Your physician wants you to follow-up in:  One year You will receive a reminder letter in the mail two months in advance. If you don't receive a letter, please call our office to schedule the follow-up appointment.   Your physician recommends that you continue on your current medications as directed. Please refer to the Current Medication list given to you today.     Your physician wants you to follow-up in:  One year  You will receive a reminder letter in the mail two months in advance. If you don't receive a letter, please call our office to schedule the follow-up appointment.    Your physician recommends that you continue on your current medications as directed. Please refer to the Current Medication list given to you today.

## 2013-05-24 NOTE — Assessment & Plan Note (Signed)
Unfortunately continues to smoke. I have strongly advised him to quit as this is significant risk factor for worsening CAD. He verbalizes understanding

## 2013-05-24 NOTE — Progress Notes (Signed)
HPI: [name] is a 52 year old former patient of Dr. Dietrich Pates we are following for ongoing assessment and management of atrial fibrillation, history of COPD, ongoing tobacco abuse, and CAD. The patient is not on any anticoagulation. Stent in normal sinus rhythm with occasional arrhythmias for greater than one year. He was last seen in the office in October 2014 status post ER visit for recurrent chest pain. He was scheduled for Lexiscan stress test.    Stress test was completed on 05/04/2013, results demonstrated overall low risk, with no diagnostic ST segment abnormalities noted. LVEF of 49% with normal volumes. There was evidence of diaphragmatic attenuation, less likely inferior septal/inferior scar.    He comes today without complaints No DOE, no chest pain or dizziness     Allergies  Allergen Reactions  . Influenza Vaccine Live Swelling    Current Outpatient Prescriptions  Medication Sig Dispense Refill  . albuterol (VENTOLIN HFA) 108 (90 BASE) MCG/ACT inhaler Inhale 2 puffs into the lungs every 4 (four) hours as needed. Shortness of breath      . aspirin EC 81 MG tablet Take 81 mg by mouth daily.      . Fluticasone-Salmeterol (ADVAIR DISKUS) 250-50 MCG/DOSE AEPB Inhale 1 puff into the lungs every 12 (twelve) hours.       [name] tiotropium (SPIRIVA) 18 MCG inhalation capsule Place 18 mcg into inhaler and inhale daily.      . nitroGLYCERIN (NITROSTAT) 0.4 MG SL tablet Place 1 tablet (0.4 mg total) under the tongue every 5 (five) minutes as needed for chest pain.  25 tablet  4   No current facility-administered medications for this visit.    Past Medical History  Diagnosis Date  . Emphysema   . COPD (chronic obstructive pulmonary disease)   . Hypercholesteremia   . Pneumonia   . Polycythemia   . Coronary artery disease   . 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 Borderline diabetes   . Seizures     last seizure 2 yrs ago  . GERD  (gastroesophageal reflux disease)   . Diabetes mellitus   . 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 Chronic back pain   . Bronchitis   . Chronic left shoulder pain   . Chronic neck pain     Past Surgical History  Procedure Laterality Date  . Angioplasty    . Colonoscopy w/ endoscopic [place]    . Vasectomy    . Throat biopsy    . Lung biopsy    . Vasectomy  1987  . Cardiac surgery      WUJ:WJXBJY of systems complete and found to be negative unless listed above  PHYSICAL EXAM BP 102/62  Pulse 89  Ht 5\' 8"  (1.727 m)  Wt 159 lb (72.122 kg)  BMI 24.18 kg/m2 General: Well developed, well nourished, in no acute distress Head: Eyes PERRLA, No xanthomas.   Normal cephalic and atramatic  Lungs: Mild wheezes no congestion. Heart: HRRR S1 S2, without MRG.  Pulses are 2+ & equal.            No carotid bruit. No JVD.  No abdominal bruits. No femoral bruits. Abdomen: Bowel sounds are positive, abdomen soft and non-tender without masses or                  Hernia's noted. Msk:  Back normal, normal gait. Normal strength and tone for age. Extremities: No  clubbing, cyanosis or edema.  DP +1 Neuro: Alert and oriented X 3. Psych:  Good affect, responds appropriately    ASSESSMENT AND PLAN

## 2013-05-24 NOTE — Assessment & Plan Note (Addendum)
Stress test was negative for new area's of ischemia. He is without complaint. Will continue ASA and risk modification. He refuses labs at this time due to finances. He has seizures after stress test. States this occurred with last stress test. He will see [place] again in one year.

## 2013-05-24 NOTE — Progress Notes (Deleted)
Name: Ryan [name]    DOB: 1961-02-10  Age: 52 y.o.  MR#: [id number]       PCP:  Frazier Richards, PA-C      Insurance: Payor: MEDICARE / Plan: MEDICARE PART A AND B / Product Type: *No Product type* /   CC:    Chief Complaint  Patient presents with  . Coronary Artery Disease  . Atrial Fibrillation    VS Filed Vitals:   05/24/13 1340  BP: 102/62  Pulse: 89  Height: 5\' 8"  (1.727 m)  Weight: 159 lb (72.122 kg)    Weights Current Weight  05/24/13 159 lb (72.122 kg)  04/22/13 139 lb (63.05 kg)  04/10/13 140 lb (63.504 kg)    Blood Pressure  BP Readings from Last 3 Encounters:  05/24/13 102/62  04/22/13 119/76  04/10/13 136/88     Admit date:  (Not on file) Last encounter with RMR:  04/22/2013   Allergy Influenza vaccine live  Current Outpatient Prescriptions  Medication Sig Dispense Refill  . albuterol (VENTOLIN HFA) 108 (90 BASE) MCG/ACT inhaler Inhale 2 puffs into the lungs every 4 (four) hours as needed. Shortness of breath      . aspirin EC 81 MG tablet Take 81 mg by mouth daily.      . Fluticasone-Salmeterol (ADVAIR DISKUS) 250-50 MCG/DOSE AEPB Inhale 1 puff into the lungs every 12 (twelve) hours.       [name] tiotropium (SPIRIVA) 18 MCG inhalation capsule Place 18 mcg into inhaler and inhale daily.      . nitroGLYCERIN (NITROSTAT) 0.4 MG SL tablet Place 1 tablet (0.4 mg total) under the tongue every 5 (five) minutes as needed for chest pain.  25 tablet  4   No current facility-administered medications for this visit.    Discontinued Meds:   There are no discontinued medications.  Patient Active Problem List   Diagnosis Date Noted  . Shoulder pain, left 01/20/2012  . Neck pain on left side 01/20/2012  . Rotator cuff syndrome of left shoulder 01/20/2012  . Spondylosis, cervical 11/19/2011  . Shoulder pain 11/19/2011  . Acute respiratory failure with hypoxia 06/23/2011  . Pulmonary cavitary lesion 06/23/2011  . SOB (shortness of breath) 06/22/2011  . Nausea  vomiting and diarrhea 06/22/2011  . Dehydration 06/22/2011  . Cough 06/22/2011  . Decompensated COPD with exacerbation (chronic obstructive pulmonary disease) 06/22/2011  . Abdominal pain 06/22/2011  . 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 Cavitary lesion of lung 05/07/2011  . GERD with stricture   . ASCVD (arteriosclerotic cardiovascular disease)   . Glucose intolerance (pre-diabetes)   . TOBACCO ABUSE 05/17/2010  . ASTHMA 01/20/2008  . HYPERLIPIDEMIA 12/27/2007  . ATRIAL FIBRILLATION 12/27/2007  . COPD 12/27/2007    LABS    Component Value Date/Time   NA 135 02/17/2013 2337   NA 136 01/18/2013 1814   NA 135 10/27/2012 2134   K 4.1 02/17/2013 2337   K 3.9 01/18/2013 1814   K 4.3 10/27/2012 2134   CL 99 02/17/2013 2337   CL 100 01/18/2013 1814   CL 98 10/27/2012 2134   CO2 27 02/17/2013 2337   CO2 26 01/18/2013 1814   CO2 26 10/27/2012 2134   GLUCOSE 96 02/17/2013 2337   GLUCOSE 83 01/18/2013 1814   GLUCOSE 114* 10/27/2012 2134   BUN 14 02/17/2013 2337   BUN 13 01/18/2013 1814   BUN 19 10/27/2012 2134   CREATININE 1.00 02/17/2013 2337   CREATININE 1.18 01/18/2013 1814   CREATININE  1.11 10/27/2012 2134   CALCIUM 9.0 02/17/2013 2337   CALCIUM 9.3 01/18/2013 1814   CALCIUM 8.8 10/27/2012 2134   GFRNONAA 83* 02/17/2013 2337   GFRNONAA 68* 01/18/2013 1814   GFRNONAA 74* 10/27/2012 2134   GFRAA >90 02/17/2013 2337   GFRAA 79* 01/18/2013 1814   GFRAA 86* 10/27/2012 2134   CMP     Component Value Date/Time   NA 135 02/17/2013 2337   K 4.1 02/17/2013 2337   CL 99 02/17/2013 2337   CO2 27 02/17/2013 2337   GLUCOSE 96 02/17/2013 2337   BUN 14 02/17/2013 2337   CREATININE 1.00 02/17/2013 2337   CALCIUM 9.0 02/17/2013 2337   PROT 7.4 01/18/2013 1814   ALBUMIN 3.4* 01/18/2013 1814   AST 17 01/18/2013 1814   ALT 16 01/18/2013 1814   ALKPHOS 81 01/18/2013 1814   BILITOT 0.2* 01/18/2013 1814   GFRNONAA 83* 02/17/2013 2337   GFRAA >90 02/17/2013 2337       Component Value Date/Time   WBC 9.6 02/17/2013 2337   WBC 8.1 01/18/2013  1814   WBC 8.5 10/27/2012 2134   HGB 16.7 02/17/2013 2337   HGB 16.8 01/18/2013 1814   HGB 16.0 10/27/2012 2134   HCT 47.6 02/17/2013 2337   HCT 48.0 01/18/2013 1814   HCT 46.1 10/27/2012 2134   MCV 86.2 02/17/2013 2337   MCV 85.9 01/18/2013 1814   MCV 83.7 10/27/2012 2134    Lipid Panel     Component Value Date/Time   CHOL 131 06/23/2011 0555   TRIG 83 06/23/2011 0555   HDL 29* 06/23/2011 0555   CHOLHDL 4.5 06/23/2011 0555   VLDL 17 06/23/2011 0555   LDLCALC 85 06/23/2011 0555   LDLCALC 101 05/17/2010 0000    ABG    Component Value Date/Time   PHART 7.398 03/12/2011 1220   PCO2ART 36.8 03/12/2011 1220   PO2ART 64.9* 03/12/2011 1220   HCO3 22.2 03/12/2011 1220   TCO2 25 06/22/2011 0414   ACIDBASEDEF 1.9 03/12/2011 1220   O2SAT 92.4 03/12/2011 1220     Lab Results  Component Value Date   TSH 4.945 ***Test methodology is 3rd generation TSH**** 12/05/2008   BNP (last 3 results) No results found for this basename: PROBNP,  in the last 8760 hours Cardiac Panel (last 3 results) No results found for this basename: CKTOTAL, CKMB, TROPONINI, RELINDX,  in the last 72 hours  Iron/TIBC/Ferritin No results found for this basename: iron, tibc, ferritin     EKG Orders placed during the hospital encounter of 04/10/13  . EKG     Prior Assessment and Plan Problem List as of 05/24/2013   HYPERLIPIDEMIA   Last Assessment & Plan   03/03/2012 Office Visit Written 03/03/2012  4:22 PM by Jodelle Gross, NP     He is followed by his primary care physician Dr. Modesto Charon for continued labs, We will request recent results.    TOBACCO ABUSE   Last Assessment & Plan   07/09/2011 Office Visit Written 07/09/2011  2:24 PM by Jodelle Gross, NP     He has stopped smoking for 2 weeks. He states he just does not want to smoke anymore. They don't taste good to him. I have congratulated him on his cessation and encouraged him to continue this.    ATRIAL FIBRILLATION   Last Assessment & Plan   04/22/2013 Office  Visit Written 04/22/2013  2:19 PM by Jodelle Gross, NP     Remains in NSR. He is  on ASA only. Rate is controlled without AV nodal blocking agents.    ASTHMA   COPD   GERD with stricture   ASCVD (arteriosclerotic cardiovascular disease)   Last Assessment & Plan   04/22/2013 Office Visit Written 04/22/2013  2:18 PM by Jodelle Gross, NP     He is having recurrent chest pain but has normal EKG. He will be scheduled for lexiscan stress myoiew as he is unable to walk on treadmill due to breathing issues and chronic back pain. Last stress test  Has been over 3 years ago [place] cardiologist [place]. 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 More recommendations once his biopsy has returned.    SOB (shortness of breath)   Nausea vomiting and diarrhea   Dehydration   Cough   Decompensated COPD with exacerbation (chronic obstructive pulmonary disease)   Abdominal pain   Acute respiratory failure with hypoxia   Pulmonary cavitary lesion   Spondylosis, cervical   Shoulder pain   Shoulder pain, left   Neck pain on left side   Rotator cuff syndrome of left shoulder       Imaging: Nm Myocar Single W/spect W/wall Motion And Ef  05/04/2013   CLINICAL DATA:  52 year old male with history of atrial fibrillation, COPD, and CAD, now referred for the assessment of ischemia.  EXAM: MYOCARDIAL IMAGING WITH SPECT (REST AND PHARMACOLOGIC-STRESS)  GATED LEFT VENTRICULAR WALL MOTION STUDY  LEFT VENTRICULAR EJECTION FRACTION  TECHNIQUE: Standard myocardial SPECT imaging was performed after resting intravenous injection of 10 mCi Tc-34m sestamibi Subsequently, intravenous  infusion of regadenoson was performed under the supervision of the Cardiology staff. At peak effect of the drug, 30 mCi Tc-48m sestamibi was injected intravenously and standard myocardial SPECT imaging was performed. Quantitative gated imaging was also performed to evaluate left ventricular wall motion, and estimate left ventricular ejection fraction.  COMPARISON:  None.  FINDINGS: Baseline ECG shows normal sinus rhythm with decreased R-wave progression. Regadenoson infusion was given in standard fashion. Heart rate increased from 65 beats per min up to 137 beats per min, and blood pressure increased from 103/80 up to 143/79. Patient tolerated infusion without chest pain, although it did experience shortness of breath and was treated with a rescue inhaler and supplemental oxygen with improvement in symptoms. There were no diagnostic ST segment abnormalities to indicate ischemia. No significant arrhythmias were noted.  Analysis of the overall perfusion dated finds diaphragmatic attenuation. Gut uptake also visualized near the inferior wall.  Tomographic views were obtained using the short axis, vertical long axis, and horizontal long axis planes. There is a mild to moderate intensity, fixed inferior and inferior septal defect. This is actually more prominent on rest imaging than stress imaging. Most consistent with diaphragmatic attenuation, less likely scar. No clear evidence of ischemia.  Gated imaging reveals an EDV of 89, ESV of 45, normal TID ratio 0.97, and LVEF of 49%. No focal wall motion abnormalities noted.  IMPRESSION: Overall low risk regadenoson Cardiolite. No diagnostic ST segment abnormalities were noted. There is evidence of diaphragmatic attenuation, less likely inferior septal/inferior scar. No definite ischemia. LVEF 49% with normal volumes, no definite  focal wall motion abnormalities.   Electronically Signed   By: Nona Dell M.D.   On: 05/04/2013 14:42

## 2013-06-03 ENCOUNTER — Other Ambulatory Visit: Payer: Self-pay | Admitting: *Deleted

## 2013-06-03 DIAGNOSIS — J984 Other disorders of lung: Secondary | ICD-10-CM

## 2013-06-28 ENCOUNTER — Ambulatory Visit (INDEPENDENT_AMBULATORY_CARE_PROVIDER_SITE_OTHER): Payer: Medicare Other | Admitting: Thoracic Surgery (Cardiothoracic Vascular Surgery)

## 2013-06-28 ENCOUNTER — Ambulatory Visit
Admission: RE | Admit: 2013-06-28 | Discharge: 2013-06-28 | Disposition: A | Discharge status: Home / Self Care | Payer: Medicare Other | Source: Ambulatory Visit | Attending: Thoracic Surgery (Cardiothoracic Vascular Surgery) | Admitting: Thoracic Surgery (Cardiothoracic Vascular Surgery)

## 2013-06-28 ENCOUNTER — Encounter: Payer: Self-pay | Admitting: Thoracic Surgery (Cardiothoracic Vascular Surgery)

## 2013-06-28 VITALS — BP 118/80 | HR 87 | Resp 16 | Ht 67.0 in | Wt 140.0 lb

## 2013-06-28 DIAGNOSIS — J984 Other disorders of lung: Secondary | ICD-10-CM | POA: Diagnosis not present

## 2013-06-28 DIAGNOSIS — R911 Solitary pulmonary nodule: Secondary | ICD-10-CM | POA: Diagnosis not present

## 2013-06-28 NOTE — Progress Notes (Signed)
HPI:  [name] returns today for 6 month followup visit.  He is a 52 year old gentleman with a history of tobacco abuse and COPD. He was found to have a cavitary lung mass back in 2012. Dr. Edwyna Shell did a navigational bronchoscopy. Biopsies revealed inflammation and cultures grew MAI. He was treated with antibiotics. He has been followed since that time with serial CT scans. The mass has remained stable over time. I last saw him in June at which time the cavitary lesion was unchanged.  He continues to smoke over a pack a day. He was able to quit briefly one time after taking Wellbutrin, but resumed smoking after that.  He still has occasional COPD flares. He was treated with steroids back in September. He has a chronic cough. He has not had any hemoptysis. His weight has been stable. He had a stress test about a month ago that was "okay". Denies fevers, chills, or night sweats.  Past Medical History  Diagnosis Date  . Emphysema   . COPD (chronic obstructive pulmonary disease)   . Hypercholesteremia   . Pneumonia   . Polycythemia   . Coronary artery disease   . 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 Borderline diabetes   . Seizures     last seizure 2 yrs ago  . GERD (gastroesophageal reflux disease)   . Diabetes mellitus   . 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 Chronic back pain   . Bronchitis   . Chronic left shoulder pain   . Chronic neck pain        Current Outpatient Prescriptions  Medication Sig Dispense Refill  . albuterol (VENTOLIN HFA) 108 (90 BASE) MCG/ACT inhaler Inhale 2 puffs into the lungs every 4 (four) hours as needed. Shortness of breath      . aspirin EC 81 MG tablet Take 81 mg by mouth daily.      . Fluticasone-Salmeterol (ADVAIR DISKUS) 250-50 MCG/DOSE AEPB Inhale 1 puff into the lungs every 12 (twelve) hours.       . nitroGLYCERIN (NITROSTAT) 0.4 MG SL tablet Place 1 tablet (0.4 mg total) under  the tongue every 5 (five) minutes as needed for chest pain.  25 tablet  4  . tiotropium (SPIRIVA) 18 MCG inhalation capsule Place 18 mcg into inhaler and inhale daily.       No current facility-administered medications for this visit.    Physical Exam BP 118/80  Pulse 87  Resp 16  Ht 5\' 7"  (1.702 m)  Wt 140 lb (63.504 kg)  BMI 21.92 kg/m2  SpO98 31% 52 year old male in no acute distress, smells of tobacco Well-developed well-nourished Alert and oriented x3 with no focal deficits Cardiac regular rate and rhythm normal S1 and S2 Lungs diminished breath sounds bilaterally, no wheezing No cervical or suprapubic or adenopathy  Diagnostic Tests: FINDINGS:  Mediastinum: Heart size is normal. There is no significant  pericardial fluid, thickening or pericardial calcification. There is  atherosclerosis of the thoracic aorta, the great vessels of the  mediastinum and the coronary arteries, including calcified  atherosclerotic plaque in the left main, left anterior descending,  left circumflex and right coronary arteries. No pathologically  enlarged mediastinal or hilar lymph nodes. Please note that accurate  exclusion of hilar adenopathy is limited on noncontrast CT scans.  Small hiatal hernia.  Lungs/Pleura: As with numerous prior examinations there is a complex  thick-walled cavitary lesion in the  periphery of the left upper  lobe. This appears very similar to the prior examinations, including  several areas of mural nodularity. There is extensive surrounding  architectural distortion and some adjacent cylindrical  bronchiectasis, as well as overlying pleural retraction. There is a  background of severe centrilobular and mild paraseptal emphysema.  Areas of septal thickening are noted in the dependent portions of  the lung bases bilaterally. Previously noted lingular nodule is  similar to prior studies measuring 7 mm on today's examination (this  is unchanged compared to prior study  09/07/2012). No acute  consolidative airspace disease. No pleural effusions.  Upper Abdomen: Unremarkable.  Musculoskeletal: There are no aggressive appearing lytic or blastic  lesions noted in the visualized portions of the skeleton.  IMPRESSION:  1. No significant change in thick-walled cavitary lesion in the left  upper lobe compared in numerous prior examinations.  2. Unchanged lingular nodule. Although this nodule was previously  reported as 5 mm on study 12/21/2012, the difference in size on  today's examination is apparently related to slice selection, as  this nodule measured 7 mm on more remote prior study 09/07/2012, and  is considered unchanged on today's examination.  3. Atherosclerosis, including left main and 3 vessel coronary artery  disease. Please note that although the presence of coronary artery  calcium documents the presence of coronary artery disease, the  severity of this disease and any potential stenosis cannot be  assessed on this non-gated CT examination. Assessment for potential  risk factor modification, dietary therapy or pharmacologic therapy  may be warranted, if clinically indicated.  4. Severe emphysema redemonstrated.  Electronically Signed  By: Trudie Reed M.D.  On: 06/28/2013 13:14   Impression: 52 year old gentleman with a thickwalled cavitary mass in the left upper lobe. This lesion has been stable for 2 years. In comparison to the CT from February of this year the walls are thinner. Overall size is unchanged. There is a smaller 7 mm lesion in the lingula that is also unchanged.  He continues to smoke. I strongly advised him to quit smoking. He is at risk for progression of his emphysema. He also is at risk for development of a lung cancer. Finally he has evidence of coronary disease and smoking could also contribute to the progression of that. I emphasized to him that the fact that this lesion to been stable on CT and that he had a recent  negative stress test does not and it is safe to continue smoking.   Plan: No need to continue to follow left upper lobe cavitary mass.  He was advised to establish primary care and followup with them.

## 2013-07-09 ENCOUNTER — Emergency Department (HOSPITAL_COMMUNITY): Payer: Medicare Other

## 2013-07-09 ENCOUNTER — Emergency Department (HOSPITAL_COMMUNITY)
Admission: EM | Admit: 2013-07-09 | Discharge: 2013-07-09 | Disposition: A | Discharge status: Home / Self Care | Payer: Medicare Other | Source: Home / Self Care | Attending: Emergency Medicine | Admitting: Emergency Medicine

## 2013-07-09 ENCOUNTER — Encounter (HOSPITAL_COMMUNITY): Payer: Self-pay | Admitting: Emergency Medicine

## 2013-07-09 DIAGNOSIS — I2510 Atherosclerotic heart disease of native coronary artery without angina pectoris: Secondary | ICD-10-CM | POA: Diagnosis not present

## 2013-07-09 DIAGNOSIS — R079 Chest pain, unspecified: Secondary | ICD-10-CM | POA: Diagnosis not present

## 2013-07-09 DIAGNOSIS — I7090 Unspecified atherosclerosis: Secondary | ICD-10-CM | POA: Diagnosis not present

## 2013-07-09 DIAGNOSIS — E7800 Pure hypercholesterolemia, unspecified: Secondary | ICD-10-CM | POA: Diagnosis present

## 2013-07-09 DIAGNOSIS — R002 Palpitations: Secondary | ICD-10-CM | POA: Diagnosis not present

## 2013-07-09 DIAGNOSIS — J9600 Acute respiratory failure, unspecified whether with hypoxia or hypercapnia: Secondary | ICD-10-CM | POA: Diagnosis not present

## 2013-07-09 DIAGNOSIS — Z833 Family history of diabetes mellitus: Secondary | ICD-10-CM | POA: Diagnosis not present

## 2013-07-09 DIAGNOSIS — J438 Other emphysema: Secondary | ICD-10-CM | POA: Diagnosis present

## 2013-07-09 DIAGNOSIS — F17200 Nicotine dependence, unspecified, uncomplicated: Secondary | ICD-10-CM | POA: Diagnosis present

## 2013-07-09 DIAGNOSIS — Z887 Allergy status to serum and vaccine status: Secondary | ICD-10-CM | POA: Diagnosis not present

## 2013-07-09 DIAGNOSIS — G8929 Other chronic pain: Secondary | ICD-10-CM | POA: Diagnosis present

## 2013-07-09 DIAGNOSIS — M542 Cervicalgia: Secondary | ICD-10-CM | POA: Diagnosis present

## 2013-07-09 DIAGNOSIS — G40909 Epilepsy, unspecified, not intractable, without status epilepticus: Secondary | ICD-10-CM | POA: Diagnosis present

## 2013-07-09 DIAGNOSIS — J189 Pneumonia, unspecified organism: Secondary | ICD-10-CM | POA: Diagnosis not present

## 2013-07-09 DIAGNOSIS — Z8701 Personal history of pneumonia (recurrent): Secondary | ICD-10-CM | POA: Diagnosis not present

## 2013-07-09 DIAGNOSIS — E872 Acidosis, unspecified: Secondary | ICD-10-CM | POA: Diagnosis not present

## 2013-07-09 DIAGNOSIS — D45 Polycythemia vera: Secondary | ICD-10-CM | POA: Diagnosis not present

## 2013-07-09 DIAGNOSIS — I4891 Unspecified atrial fibrillation: Secondary | ICD-10-CM | POA: Diagnosis not present

## 2013-07-09 DIAGNOSIS — K219 Gastro-esophageal reflux disease without esophagitis: Secondary | ICD-10-CM | POA: Diagnosis present

## 2013-07-09 DIAGNOSIS — Z8249 Family history of ischemic heart disease and other diseases of the circulatory system: Secondary | ICD-10-CM | POA: Diagnosis not present

## 2013-07-09 DIAGNOSIS — R0602 Shortness of breath: Secondary | ICD-10-CM | POA: Diagnosis not present

## 2013-07-09 DIAGNOSIS — Z7982 Long term (current) use of aspirin: Secondary | ICD-10-CM | POA: Diagnosis not present

## 2013-07-09 DIAGNOSIS — E119 Type 2 diabetes mellitus without complications: Secondary | ICD-10-CM | POA: Diagnosis present

## 2013-07-09 DIAGNOSIS — IMO0002 Reserved for concepts with insufficient information to code with codable children: Secondary | ICD-10-CM | POA: Diagnosis present

## 2013-07-09 MED ORDER — AZITHROMYCIN 250 MG PO TABS: 250.0000 mg | ORAL_TABLET | Freq: Every day | ORAL | Status: DC

## 2013-07-09 MED ORDER — IPRATROPIUM BROMIDE 0.02 % IN SOLN
RESPIRATORY_TRACT | Status: AC
  Filled 2013-07-09: qty 2.5

## 2013-07-09 MED ORDER — HYDROCOD POLST-CHLORPHEN POLST 10-8 MG/5ML PO LQCR
5.0000 mL | Freq: Once | ORAL | Status: AC
  Administered 2013-07-09: 5 mL via ORAL
  Filled 2013-07-09: qty 5

## 2013-07-09 MED ORDER — PREDNISONE 20 MG PO TABS
60.0000 mg | ORAL_TABLET | Freq: Once | ORAL | Status: AC
  Administered 2013-07-09: 60 mg via ORAL
  Filled 2013-07-09: qty 3

## 2013-07-09 MED ORDER — ALBUTEROL SULFATE (5 MG/ML) 0.5% IN NEBU
5.0000 mg | INHALATION_SOLUTION | Freq: Once | RESPIRATORY_TRACT | Status: AC
  Administered 2013-07-09: 2.5 mg via RESPIRATORY_TRACT
  Filled 2013-07-09: qty 1

## 2013-07-09 MED ORDER — IPRATROPIUM BROMIDE 0.02 % IN SOLN
0.5000 mg | Freq: Once | RESPIRATORY_TRACT | Status: AC
  Administered 2013-07-09: 0.5 mg via RESPIRATORY_TRACT
  Filled 2013-07-09: qty 2.5

## 2013-07-09 MED ORDER — HYDROCOD POLST-CHLORPHEN POLST 10-8 MG/5ML PO LQCR: 5.0000 mL | Freq: Two times a day (BID) | ORAL | Status: DC | PRN

## 2013-07-09 NOTE — ED Notes (Addendum)
Pt c/o productive cough with yellow/green phlegm.  C/o back throbbing.  States he thinks he has pna.  Hx of COPD, emphysema

## 2013-07-09 NOTE — ED Provider Notes (Signed)
CSN: [id number]     Arrival date & time 07/09/13  1306 History   First MD Initiated Contact with Patient 07/09/13 1557     Chief Complaint  Patient presents with  . Cough   (Consider location/radiation/quality/duration/timing/severity/associated sxs/prior Treatment) HPI Comments: Patient is 52 year old male with history of COPD, LUL lung mass (stable after years of examination), CAD, GERD, DM, DDD and chronic lower back pain presents to the ED today with complaints of productive cough with thick green sputum production, he reports no chest pain but increase in shortness of breath despite using his inhalers.  He denies fever, chills but states that this feels like the last time he had pneumonia.  He reports right upper abdominal tenderness from coughing but denies nausea, vomiting, sweating, constipation or diarrhea.  He states this also happens with his pneumonia and the chronic cough.  He has not quit smoking.  He has a history of chronic lower back pain but states that when he feels like this it is exacerbated.  Patient is a 52 y.o. male presenting with cough. The history is provided by the patient. No language interpreter was used.  Cough Cough characteristics:  Productive Sputum characteristics:  Green Severity:  Severe Onset quality:  Gradual Duration:  2 days Timing:  Constant Progression:  Worsening Chronicity:  Chronic Smoker: yes   Context: upper respiratory infection   Context: not sick contacts and not weather changes   Relieved by:  Nothing Worsened by:  Nothing tried Ineffective treatments:  Beta-agonist inhaler Associated symptoms: chills, myalgias, shortness of breath and wheezing   Associated symptoms: no chest pain, no ear pain, no eye discharge, no fever, no headaches, no rash, no rhinorrhea and no sore throat     Past Medical History  Diagnosis Date  . Emphysema   . COPD (chronic obstructive pulmonary disease)   . Hypercholesteremia   . Pneumonia   .  Polycythemia   . Coronary artery disease   . 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 Borderline diabetes   . Seizures     last seizure 2 yrs ago  . GERD (gastroesophageal reflux disease)   . Diabetes mellitus   . 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 Chronic back pain   . Bronchitis   . Chronic left shoulder pain   . Chronic neck pain    Past Surgical History  Procedure Laterality Date  . Angioplasty    . Colonoscopy w/ endoscopic [place]    . Vasectomy    . Throat biopsy    . Lung biopsy    . Vasectomy  1987  . Cardiac surgery     Family History  Problem Relation Age of Onset  . Hypertension Mother   . Diabetes Mother   . Heart attack Mother   . Heart failure Mother   . Hypertension Sister   . Diabetes Sister   . Heart failure Sister    History  Substance Use Topics  . Smoking status: Current Every Day Smoker -- 1.00 packs/day for 45 years    Types: Cigarettes  . Smokeless tobacco: Former Neurosurgeon  . Alcohol Use: No    Review of Systems  Constitutional: Positive for chills. Negative for fever.  HENT: Negative for ear pain, rhinorrhea and sore throat.   Eyes: Negative for discharge.  Respiratory: Positive for cough, shortness of breath and wheezing.   Cardiovascular: Negative for chest pain.  Musculoskeletal: Positive for myalgias.  Skin: Negative for rash.  Neurological: Negative for headaches.  All other systems reviewed and are negative.    Allergies  Influenza vaccine live  Home Medications   Current Outpatient Rx  Name  Route  Sig  Dispense  Refill  . albuterol (VENTOLIN HFA) 108 (90 BASE) MCG/ACT inhaler   Inhalation   Inhale 2 puffs into the lungs every 4 (four) hours as needed. Shortness of breath         . aspirin EC 81 MG tablet   Oral   Take 81 mg by mouth daily.         . Fluticasone-Salmeterol (ADVAIR DISKUS) 250-50 MCG/DOSE AEPB   Inhalation   Inhale 1 puff into the lungs every  12 (twelve) hours.          [name] tiotropium (SPIRIVA) 18 MCG inhalation capsule   Inhalation   Place 18 mcg into inhaler and inhale daily.         . nitroGLYCERIN (NITROSTAT) 0.4 MG SL tablet   Sublingual   Place 1 tablet (0.4 mg total) under the tongue every 5 (five) minutes as needed for chest pain.   25 tablet   4    BP 105/75  Pulse 98  Temp(Src) 98 F (36.7 C) (Oral)  Resp 20  SpO2 95% Physical Exam  Nursing note and vitals reviewed. Constitutional: He is oriented to person, place, and time. He appears well-developed and well-nourished. No distress.  HENT:  Head: Normocephalic and atraumatic.  Right Ear: External ear normal.  Left Ear: External ear normal.  Nose: Nose normal.  Mouth/Throat: Oropharynx is clear and moist. No oropharyngeal exudate.  Eyes: Conjunctivae are normal. Pupils are equal, round, and reactive to light. No scleral icterus.  Neck: Normal range of motion. Neck supple.  Cardiovascular: Normal rate, regular rhythm and normal heart sounds.  Exam reveals no gallop and no friction rub.   No murmur heard. Pulmonary/Chest: Effort normal. No respiratory distress. He has wheezes. He has no rales. He exhibits tenderness.  Bilateral expiratory wheezing, mild parasternal chest tenderness to palpation  Abdominal: Soft. Bowel sounds are normal. He exhibits no distension and no mass. There is tenderness in the right upper quadrant. There is no rebound and no guarding.    Musculoskeletal: Normal range of motion. He exhibits no edema and no tenderness.  Lymphadenopathy:    He has no cervical adenopathy.  Neurological: He is alert and oriented to person, place, and time. He exhibits normal muscle tone. Coordination normal.  Skin: Skin is warm and dry. No rash noted. No erythema. No pallor.  Psychiatric: He has a normal mood and affect. His behavior is normal. Judgment and thought content normal.    ED Course  Procedures (including critical care time) Labs  Review Labs Reviewed - No data to display Imaging Review No results found.  EKG Interpretation   None      Results for orders placed during the hospital encounter of 02/17/13  BASIC METABOLIC PANEL      Result Value Range   Sodium 135  135 - 145 mEq/L   Potassium 4.1  3.5 - 5.1 mEq/L   Chloride 99  96 - 112 mEq/L   CO2 27  19 - 32 mEq/L   Glucose, Bld 96  70 - 99 mg/dL   BUN 14  6 - 23 mg/dL   Creatinine, Ser 1.61  0.50 - 1.35 mg/dL   Calcium 9.0  8.4 - 09.6 mg/dL  GFR calc non Af Amer 83 (*) >90 mL/min   GFR calc Af Amer >90  >90 mL/min  CBC WITH DIFFERENTIAL      Result Value Range   WBC 9.6  4.0 - 10.5 K/uL   RBC 5.52  4.22 - 5.81 MIL/uL   Hemoglobin 16.7  13.0 - 17.0 g/dL   HCT 16.1  09.6 - 04.5 %   MCV 86.2  78.0 - 100.0 fL   MCH 30.3  26.0 - 34.0 pg   MCHC 35.1  30.0 - 36.0 g/dL   RDW 40.9  81.1 - 91.4 %   Platelets 215  150 - 400 K/uL   Neutrophils Relative % 55  43 - 77 %   Neutro Abs 5.4  1.7 - 7.7 K/uL   Lymphocytes Relative 35  12 - 46 %   Lymphs Abs 3.3  0.7 - 4.0 K/uL   Monocytes Relative 8  3 - 12 %   Monocytes Absolute 0.7  0.1 - 1.0 K/uL   Eosinophils Relative 2  0 - 5 %   Eosinophils Absolute 0.1  0.0 - 0.7 K/uL   Basophils Relative 0  0 - 1 %   Basophils Absolute 0.0  0.0 - 0.1 K/uL  TROPONIN I      Result Value Range   Troponin I <0.30  <0.30 ng/mL   Dg Chest 2 View  07/09/2013   CLINICAL DATA:  One month history of cough and shortness of breath. Smoker with history of emphysema. Known cavitary left upper lobe lung mass and left hilar lymphadenopathy.  EXAM: CHEST  2 VIEW  COMPARISON:  Two-view chest x-ray 04/10/2013, 02/17/2013. CT chest 12/21/2012.  FINDINGS: Cardiac silhouette normal in size, unchanged. Left hilar adenopathy, unchanged. Hilar and mediastinal contours otherwise unremarkable. Cavitary mass with air-fluid level in the lateral left upper lobe, unchanged. View streaky airspace opacities in the right lower lobe. No new abnormalities  elsewhere in either lung. Severe bullous emphysematous changes. Visualized bony thorax intact.  IMPRESSION: 1. Acute right lower lobe pneumonia superimposed upon severe COPD/emphysema. 2. Stable cavitary mass in the left upper lobe and left hilar lymphadenopathy.   Electronically Signed   By: Hulan Saas M.D.   On: 07/09/2013 16:49   Ct Chest Wo Contrast  06/28/2013   CLINICAL DATA:  Cough and mild chest pain. History of prior cavitary lung lesion. Prior biopsy demonstrated mycobacterial infection.  EXAM: CT CHEST WITHOUT CONTRAST  TECHNIQUE: Multidetector CT imaging of the chest was performed following the standard protocol without IV contrast.  COMPARISON:  Chest CT 12/21/2012.  FINDINGS: Mediastinum: Heart size is normal. There is no significant pericardial fluid, thickening or pericardial calcification. There is atherosclerosis of the thoracic aorta, the great vessels of the mediastinum and the coronary arteries, including calcified atherosclerotic plaque in the left main, left anterior descending, left circumflex and right coronary arteries. No pathologically enlarged mediastinal or hilar lymph nodes. Please note that accurate exclusion of hilar adenopathy is limited on noncontrast CT scans. Small hiatal hernia.  Lungs/Pleura: As with numerous prior examinations there is a complex thick-walled cavitary lesion in the periphery of the left upper lobe. This appears very similar to the prior examinations, including several areas of mural nodularity. There is extensive surrounding architectural distortion and some adjacent cylindrical bronchiectasis, as well as overlying pleural retraction. There is a background of severe centrilobular and mild paraseptal emphysema. Areas of septal thickening are noted in the dependent portions of the lung bases bilaterally. Previously noted lingular nodule  is similar to prior studies measuring 7 mm on today's examination (this is unchanged compared to prior study  09/07/2012). No acute consolidative airspace disease. No pleural effusions.  Upper Abdomen: Unremarkable.  Musculoskeletal: There are no aggressive appearing lytic or blastic lesions noted in the visualized portions of the skeleton.  IMPRESSION: 1. No significant change in thick-walled cavitary lesion in the left upper lobe compared in numerous prior examinations. 2. Unchanged lingular nodule. Although this nodule was previously reported as 5 mm on study 12/21/2012, the difference in size on today's examination is apparently related to slice selection, as this nodule measured 7 mm on more remote prior study 09/07/2012, and is considered unchanged on today's examination. 3. Atherosclerosis, including left main and 3 vessel coronary artery disease. Please note that although the presence of coronary artery calcium documents the presence of coronary artery disease, the severity of this disease and any potential stenosis cannot be assessed on this non-gated CT examination. Assessment for potential risk factor modification, dietary therapy or pharmacologic therapy may be warranted, if clinically indicated. 4. Severe emphysema redemonstrated.   Electronically Signed   By: Trudie Reed M.D.   On: 06/28/2013 13:14      MDM  CAP   4:40 PM Plan to get chest x-ray, breathing treatments, steroid and tussionex  6:19 PM Patient reports improvement in symptoms after breathing treatments, new RLL infiltrate, no hypoxia, has home oxygen, will continue home treatments.  No longer sees pulmonary, will follow up with PCP   Izola Price. Marisue Humble, PA-C 07/09/13 1821

## 2013-07-09 NOTE — ED Notes (Signed)
Pt transported to XRAY °

## 2013-07-10 ENCOUNTER — Inpatient Hospital Stay (HOSPITAL_COMMUNITY)
Admission: EM | Admit: 2013-07-10 | Discharge: 2013-07-12 | DRG: 308 | Disposition: A | Discharge status: Home / Self Care | Payer: Medicare Other | Attending: Family Medicine | Admitting: Family Medicine

## 2013-07-10 ENCOUNTER — Emergency Department (HOSPITAL_COMMUNITY): Payer: Medicare Other

## 2013-07-10 ENCOUNTER — Encounter (HOSPITAL_COMMUNITY): Payer: Self-pay | Admitting: Emergency Medicine

## 2013-07-10 DIAGNOSIS — D45 Polycythemia vera: Secondary | ICD-10-CM | POA: Diagnosis present

## 2013-07-10 DIAGNOSIS — G40909 Epilepsy, unspecified, not intractable, without status epilepticus: Secondary | ICD-10-CM | POA: Diagnosis present

## 2013-07-10 DIAGNOSIS — K219 Gastro-esophageal reflux disease without esophagitis: Secondary | ICD-10-CM | POA: Diagnosis present

## 2013-07-10 DIAGNOSIS — I2510 Atherosclerotic heart disease of native coronary artery without angina pectoris: Secondary | ICD-10-CM | POA: Diagnosis present

## 2013-07-10 DIAGNOSIS — E119 Type 2 diabetes mellitus without complications: Secondary | ICD-10-CM | POA: Diagnosis present

## 2013-07-10 DIAGNOSIS — Z7982 Long term (current) use of aspirin: Secondary | ICD-10-CM

## 2013-07-10 DIAGNOSIS — E872 Acidosis, unspecified: Secondary | ICD-10-CM | POA: Diagnosis present

## 2013-07-10 DIAGNOSIS — G8929 Other chronic pain: Secondary | ICD-10-CM | POA: Diagnosis present

## 2013-07-10 DIAGNOSIS — IMO0002 Reserved for concepts with insufficient information to code with codable children: Secondary | ICD-10-CM | POA: Diagnosis present

## 2013-07-10 DIAGNOSIS — R7989 Other specified abnormal findings of blood chemistry: Secondary | ICD-10-CM

## 2013-07-10 DIAGNOSIS — E7800 Pure hypercholesterolemia, unspecified: Secondary | ICD-10-CM | POA: Diagnosis present

## 2013-07-10 DIAGNOSIS — J189 Pneumonia, unspecified organism: Secondary | ICD-10-CM

## 2013-07-10 DIAGNOSIS — J9600 Acute respiratory failure, unspecified whether with hypoxia or hypercapnia: Secondary | ICD-10-CM | POA: Diagnosis present

## 2013-07-10 DIAGNOSIS — Z833 Family history of diabetes mellitus: Secondary | ICD-10-CM

## 2013-07-10 DIAGNOSIS — J984 Other disorders of lung: Secondary | ICD-10-CM | POA: Diagnosis present

## 2013-07-10 DIAGNOSIS — R0602 Shortness of breath: Secondary | ICD-10-CM

## 2013-07-10 DIAGNOSIS — Z8701 Personal history of pneumonia (recurrent): Secondary | ICD-10-CM

## 2013-07-10 DIAGNOSIS — J438 Other emphysema: Secondary | ICD-10-CM | POA: Diagnosis present

## 2013-07-10 DIAGNOSIS — R739 Hyperglycemia, unspecified: Secondary | ICD-10-CM

## 2013-07-10 DIAGNOSIS — I4891 Unspecified atrial fibrillation: Principal | ICD-10-CM | POA: Diagnosis present

## 2013-07-10 DIAGNOSIS — J449 Chronic obstructive pulmonary disease, unspecified: Secondary | ICD-10-CM | POA: Diagnosis present

## 2013-07-10 DIAGNOSIS — F17200 Nicotine dependence, unspecified, uncomplicated: Secondary | ICD-10-CM | POA: Diagnosis present

## 2013-07-10 DIAGNOSIS — Z887 Allergy status to serum and vaccine status: Secondary | ICD-10-CM

## 2013-07-10 DIAGNOSIS — J9601 Acute respiratory failure with hypoxia: Secondary | ICD-10-CM

## 2013-07-10 DIAGNOSIS — M542 Cervicalgia: Secondary | ICD-10-CM | POA: Diagnosis present

## 2013-07-10 DIAGNOSIS — Z8249 Family history of ischemic heart disease and other diseases of the circulatory system: Secondary | ICD-10-CM

## 2013-07-10 HISTORY — DX: Unspecified atrial fibrillation: I48.91

## 2013-07-10 HISTORY — DX: Personal history of pneumonia (recurrent): Z87.01

## 2013-07-10 HISTORY — DX: Atherosclerotic heart disease of native coronary artery without angina pectoris: I25.10

## 2013-07-10 HISTORY — DX: Pneumonia, unspecified organism: J18.9

## 2013-07-10 HISTORY — DX: Type 2 diabetes mellitus without complications: E11.9

## 2013-07-10 LAB — CBC WITH DIFFERENTIAL/PLATELET
Basophils Absolute: 0 10*3/uL (ref 0.0–0.1)
Eosinophils Absolute: 0 10*3/uL (ref 0.0–0.7)
Eosinophils Relative: 0 % (ref 0–5)
HCT: 46.5 % (ref 39.0–52.0)
Hemoglobin: 16.3 g/dL (ref 13.0–17.0)
Lymphocytes Relative: 11 % — ABNORMAL LOW (ref 12–46)
MCH: 30.4 pg (ref 26.0–34.0)
MCHC: 35.1 g/dL (ref 30.0–36.0)
MCV: 86.6 fL (ref 78.0–100.0)
Monocytes Absolute: 0.6 10*3/uL (ref 0.1–1.0)
Monocytes Relative: 6 % (ref 3–12)
Neutro Abs: 8 10*3/uL — ABNORMAL HIGH (ref 1.7–7.7)
Neutrophils Relative %: 83 % — ABNORMAL HIGH (ref 43–77)
Platelets: 209 10*3/uL (ref 150–400)
RDW: 13.5 % (ref 11.5–15.5)
WBC: 9.7 10*3/uL (ref 4.0–10.5)

## 2013-07-10 LAB — BASIC METABOLIC PANEL
BUN: 12 mg/dL (ref 6–23)
CO2: 21 mEq/L (ref 19–32)
Calcium: 9.3 mg/dL (ref 8.4–10.5)
Chloride: 96 mEq/L (ref 96–112)
Creatinine, Ser: 1.08 mg/dL (ref 0.50–1.35)
GFR calc Af Amer: 88 mL/min — ABNORMAL LOW (ref >90)
Potassium: 4.4 mEq/L (ref 3.5–5.1)

## 2013-07-10 LAB — MAGNESIUM: Magnesium: 1.9 mg/dL (ref 1.5–2.5)

## 2013-07-10 LAB — TROPONIN I
Troponin I: <0.3 ng/mL (ref <0.30)
Troponin I: <0.3 ng/mL (ref <0.30)

## 2013-07-10 LAB — LACTIC ACID, PLASMA: Lactic Acid, Venous: 3.7 mmol/L — ABNORMAL HIGH (ref 0.5–2.2)

## 2013-07-10 LAB — GLUCOSE, CAPILLARY: Glucose-Capillary: 93 mg/dL (ref 70–99)

## 2013-07-10 LAB — TSH: TSH: 1.215 u[IU]/mL (ref 0.350–4.500)

## 2013-07-10 LAB — PRO B NATRIURETIC PEPTIDE: Pro B Natriuretic peptide (BNP): 79.5 pg/mL (ref 0–125)

## 2013-07-10 LAB — HEMOGLOBIN A1C: Hgb A1c MFr Bld: 6.6 % — ABNORMAL HIGH (ref <5.7)

## 2013-07-10 LAB — MRSA PCR SCREENING: MRSA by PCR: NEGATIVE

## 2013-07-10 MED ORDER — SODIUM CHLORIDE 0.9 % IV SOLN
INTRAVENOUS | Status: DC
  Administered 2013-07-11: 03:00:00 via INTRAVENOUS

## 2013-07-10 MED ORDER — DEXTROSE 5 % IV SOLN
500.0000 mg | Freq: Once | INTRAVENOUS | Status: AC
  Administered 2013-07-10: 500 mg via INTRAVENOUS

## 2013-07-10 MED ORDER — ENOXAPARIN SODIUM 40 MG/0.4ML ~~LOC~~ SOLN
40.0000 mg | SUBCUTANEOUS | Status: DC
  Administered 2013-07-10 – 2013-07-12 (×3): 40 mg via SUBCUTANEOUS
  Filled 2013-07-10 (×3): qty 0.4

## 2013-07-10 MED ORDER — MOMETASONE FURO-FORMOTEROL FUM 100-5 MCG/ACT IN AERO
2.0000 | INHALATION_SPRAY | Freq: Two times a day (BID) | RESPIRATORY_TRACT | Status: DC
  Administered 2013-07-10 – 2013-07-12 (×5): 2 via RESPIRATORY_TRACT
  Filled 2013-07-10: qty 8.8

## 2013-07-10 MED ORDER — ACETAMINOPHEN 650 MG RE SUPP: 650.0000 mg | Freq: Four times a day (QID) | RECTAL | Status: DC | PRN

## 2013-07-10 MED ORDER — ASPIRIN EC 81 MG PO TBEC
81.0000 mg | DELAYED_RELEASE_TABLET | Freq: Every day | ORAL | Status: DC
  Administered 2013-07-10 – 2013-07-12 (×3): 81 mg via ORAL
  Filled 2013-07-10 (×3): qty 1

## 2013-07-10 MED ORDER — ALBUTEROL SULFATE HFA 108 (90 BASE) MCG/ACT IN AERS
2.0000 | INHALATION_SPRAY | RESPIRATORY_TRACT | Status: DC | PRN
  Administered 2013-07-10: 2 via RESPIRATORY_TRACT
  Filled 2013-07-10: qty 6.7

## 2013-07-10 MED ORDER — HYDROCODONE-ACETAMINOPHEN 5-325 MG PO TABS
1.0000 | ORAL_TABLET | ORAL | Status: DC | PRN
  Administered 2013-07-10 – 2013-07-11 (×6): 1 via ORAL
  Filled 2013-07-10 (×6): qty 1

## 2013-07-10 MED ORDER — BIOTENE DRY MOUTH MT LIQD
15.0000 mL | Freq: Two times a day (BID) | OROMUCOSAL | Status: DC
  Administered 2013-07-10 – 2013-07-12 (×4): 15 mL via OROMUCOSAL

## 2013-07-10 MED ORDER — ALUM & MAG HYDROXIDE-SIMETH 200-200-20 MG/5ML PO SUSP
30.0000 mL | Freq: Once | ORAL | Status: AC
  Administered 2013-07-10: 30 mL via ORAL
  Filled 2013-07-10: qty 30

## 2013-07-10 MED ORDER — ASPIRIN 81 MG PO CHEW
324.0000 mg | CHEWABLE_TABLET | Freq: Once | ORAL | Status: AC
  Administered 2013-07-10: 324 mg via ORAL
  Filled 2013-07-10: qty 4

## 2013-07-10 MED ORDER — DILTIAZEM HCL 60 MG PO TABS
60.0000 mg | ORAL_TABLET | Freq: Four times a day (QID) | ORAL | Status: DC
  Administered 2013-07-10 – 2013-07-12 (×7): 60 mg via ORAL
  Filled 2013-07-10 (×8): qty 1

## 2013-07-10 MED ORDER — SODIUM CHLORIDE 0.9 % IV SOLN: INTRAVENOUS | Status: DC

## 2013-07-10 MED ORDER — NICOTINE 7 MG/24HR TD PT24
7.0000 mg | MEDICATED_PATCH | Freq: Every day | TRANSDERMAL | Status: DC
  Administered 2013-07-10 – 2013-07-11 (×2): 7 mg via TRANSDERMAL
  Filled 2013-07-10 (×4): qty 1

## 2013-07-10 MED ORDER — ACETAMINOPHEN 325 MG PO TABS
650.0000 mg | ORAL_TABLET | Freq: Four times a day (QID) | ORAL | Status: DC | PRN
Start: 2013-07-10 — End: 2013-07-12
  Administered 2013-07-10: 650 mg via ORAL
  Filled 2013-07-10: qty 2

## 2013-07-10 MED ORDER — CEFTRIAXONE SODIUM 1 G IJ SOLR
1.0000 g | Freq: Once | INTRAMUSCULAR | Status: AC
  Administered 2013-07-10: 1 g via INTRAVENOUS
  Filled 2013-07-10: qty 10

## 2013-07-10 MED ORDER — DILTIAZEM HCL 25 MG/5ML IV SOLN
20.0000 mg | Freq: Once | INTRAVENOUS | Status: AC
  Administered 2013-07-10: 20 mg via INTRAVENOUS
  Filled 2013-07-10: qty 5

## 2013-07-10 MED ORDER — SODIUM CHLORIDE 0.9 % IJ SOLN
3.0000 mL | Freq: Two times a day (BID) | INTRAMUSCULAR | Status: DC
  Administered 2013-07-10 – 2013-07-11 (×2): 3 mL via INTRAVENOUS

## 2013-07-10 MED ORDER — DILTIAZEM HCL 100 MG IV SOLR
5.0000 mg/h | INTRAVENOUS | Status: DC
  Administered 2013-07-10: 5 mg/h via INTRAVENOUS

## 2013-07-10 MED ORDER — GUAIFENESIN-DM 100-10 MG/5ML PO SYRP
5.0000 mL | ORAL_SOLUTION | ORAL | Status: DC | PRN
  Administered 2013-07-10 – 2013-07-12 (×11): 5 mL via ORAL
  Filled 2013-07-10 (×11): qty 5

## 2013-07-10 MED ORDER — DEXTROSE 5 % IV SOLN
1.0000 g | INTRAVENOUS | Status: DC
  Administered 2013-07-10 – 2013-07-11 (×2): 1 g via INTRAVENOUS
  Filled 2013-07-10 (×2): qty 10

## 2013-07-10 MED ORDER — TIOTROPIUM BROMIDE MONOHYDRATE 18 MCG IN CAPS
18.0000 ug | ORAL_CAPSULE | Freq: Every day | RESPIRATORY_TRACT | Status: DC
  Administered 2013-07-10 – 2013-07-12 (×3): 18 ug via RESPIRATORY_TRACT
  Filled 2013-07-10: qty 5

## 2013-07-10 MED ORDER — AZITHROMYCIN 250 MG PO TABS: 250.0000 mg | ORAL_TABLET | Freq: Every day | ORAL | Status: DC

## 2013-07-10 MED ORDER — AZITHROMYCIN 250 MG PO TABS
250.0000 mg | ORAL_TABLET | Freq: Every day | ORAL | Status: DC
  Administered 2013-07-10 – 2013-07-12 (×3): 250 mg via ORAL
  Filled 2013-07-10 (×3): qty 1

## 2013-07-10 MED ORDER — MORPHINE SULFATE 4 MG/ML IJ SOLN
4.0000 mg | Freq: Once | INTRAMUSCULAR | Status: AC
  Administered 2013-07-10: 4 mg via INTRAVENOUS
  Filled 2013-07-10: qty 1

## 2013-07-10 MED ORDER — NITROGLYCERIN 0.4 MG SL SUBL: 0.4000 mg | SUBLINGUAL_TABLET | SUBLINGUAL | Status: DC | PRN

## 2013-07-10 NOTE — ED Notes (Signed)
Pt states he was seen at [facility] Saturday afternoon for respiratory issues, diagnosed with pneumonia, states they told him they wanted to admit him at that time but they "had no beds available"  Pt states tonight he was feeling worse and checked his heart rate and found it to be running fast.  Pt also admits to mid sternal chest pain

## 2013-07-10 NOTE — ED Provider Notes (Signed)
Medical screening examination/treatment/procedure(s) were performed by non-physician practitioner and as supervising physician I was immediately available for consultation/collaboration.  EKG Interpretation   None         Shanna Cisco, MD 07/10/13 1228

## 2013-07-10 NOTE — H&P (Signed)
[facility] History and Physical  Ryan [name] ZOX:[id number] DOB: Apr 20, 1961 DOA: 07/10/2013  Referring physician: ED PCP: Frazier Richards, PA-C  Specialists: none  Chief Complaint: Shortness of breath  HPI: Ryan [name] is a 52 y.o. male  She was seen in the emergency room today E. with a feeling of palpitations and shortness of breath. He was diagnosed yesterday in the [facility] long emergency room with pneumonia. He was given a Z-Pak to start taking but he did not fill it yet. Today he felt palpitations and came to the emergency room he denies chest pain or nausea or vomiting.  Review of Systems: The patient denies anorexia, fever, weight loss,, vision loss, decreased hearing, hoarseness, chest pain, syncope, dyspnea on exertion, peripheral edema, balance deficits, hemoptysis, abdominal pain, melena, hematochezia, severe indigestion/heartburn, hematuria, incontinence, genital sores, muscle weakness, suspicious skin lesions, transient blindness, difficulty walking, depression, unusual weight change, abnormal bleeding, enlarged lymph nodes, angioedema, and breast masses.    Past Medical History  Diagnosis Date  . Emphysema   . COPD (chronic obstructive pulmonary disease)   . Hypercholesteremia   . Pneumonia   . Polycythemia   . Coronary artery disease   . 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 Borderline diabetes   . Seizures     last seizure 2 yrs ago  . GERD (gastroesophageal reflux disease)   . Diabetes mellitus   . 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 Chronic back pain   . Bronchitis   . Chronic left shoulder pain   . Chronic neck pain    Past Surgical History  Procedure Laterality Date  . Angioplasty    . Colonoscopy w/ endoscopic [place]    . Vasectomy    . Throat biopsy    . Lung biopsy    . Vasectomy  1987  . Cardiac surgery     Social History:  reports that he has been smoking Cigarettes.  He has  a 45 pack-year smoking history. He has quit using smokeless tobacco. He reports that he uses illicit drugs (Marijuana). He reports that he does not drink alcohol. The patient lives at home and does his own ADLs  Allergies  Allergen Reactions  . Influenza Vaccine Live Swelling    Family History  Problem Relation Age of Onset  . Hypertension Mother   . Diabetes Mother   . Heart attack Mother   . Heart failure Mother   . Hypertension Sister   . Diabetes Sister   . Heart failure Sister       Prior to Admission medications   Medication Sig Start Date End Date Taking? Authorizing Provider  albuterol (VENTOLIN HFA) 108 (90 BASE) MCG/ACT inhaler Inhale 2 puffs into the lungs every 4 (four) hours as needed. Shortness of breath   Yes Historical Provider, MD  aspirin EC 81 MG tablet Take 81 mg by mouth daily.   Yes Historical Provider, MD  azithromycin (ZITHROMAX) 250 MG tablet Take 1 tablet (250 mg total) by mouth daily. Take first 2 tablets together, then 1 every day until finished. 07/09/13  Yes Scarlette Calico C. Sanford, PA-C  chlorpheniramine-HYDROcodone (TUSSIONEX PENNKINETIC ER) 10-8 MG/5ML LQCR Take 5 mLs by mouth every 12 (twelve) hours as needed for cough. 07/09/13  Yes Scarlette Calico C. Sanford, PA-C  Fluticasone-Salmeterol (ADVAIR DISKUS) 250-50 MCG/DOSE AEPB Inhale 1 puff into the lungs every 12 (twelve) hours.    Yes Historical Provider, MD  nitroGLYCERIN (NITROSTAT) 0.4 MG SL tablet Place 1 tablet (0.4 mg total) under the tongue every 5 (five) minutes as needed for chest pain. 04/22/13  Yes Jodelle Gross, NP  tiotropium (SPIRIVA) 18 MCG inhalation capsule Place 18 mcg into inhaler and inhale daily.   Yes Historical Provider, MD   Physical Exam: Filed Vitals:   07/10/13 0730  BP: 95/71  Pulse: 71  Temp:   Resp: 23   Nursing note and vitals reviewed. Constitutional: He is oriented to person, place, and time. He  appears well-developed and well-nourished.  HENT:  Nose: Nose normal.   Mouth/Throat: Oropharynx is clear and moist. No oropharyngeal exudate.  Eyes: Conjunctivae are normal. Pupils are equal, round, and reactive to light.  Neck: Normal range of motion. Neck supple. No thyromegaly present.  Cardiovascular: Irregularly irregular but with a normal rate and normal heart sounds.   Pulmonary/Chest: Effort normal and breath sounds normal.  Abdominal: Soft. Bowel sounds are normal.  no distension. There is no tenderness. There is no rebound.  Lymphadenopathy:    He has no cervical adenopathy.  Neurological: He is alert and oriented to person, place, and time. He has normal reflexes.  Skin: Skin is warm and dry.He has no concerning moles or skin lesions Psychiatric: He has a normal mood and affect. His behavior is normal.   Labs on Admission:  Basic Metabolic Panel:  Recent Labs Lab 07/10/13 0153  NA 133*  K 4.4  CL 96  CO2 21  GLUCOSE 301*  BUN 12  CREATININE 1.08  CALCIUM 9.3   Liver Function Tests: No results found for this basename: AST, ALT, ALKPHOS, BILITOT, PROT, ALBUMIN,  in the last 168 hours No results found for this basename: LIPASE, AMYLASE,  in the last 168 hours No results found for this basename: AMMONIA,  in the last 168 hours CBC:  Recent Labs Lab 07/10/13 0159  WBC 9.7  NEUTROABS 8.0*  HGB 16.3  HCT 46.5  MCV 86.6  PLT 209   Cardiac Enzymes:  Recent Labs Lab 07/10/13 0159  TROPONINI <0.30    BNP (last 3 results)  Recent Labs  07/10/13 0159  PROBNP 79.5   CBG:  Recent Labs Lab 07/10/13 0736  GLUCAP 93    Radiological Exams on Admission: Dg Chest 2 View  07/09/2013   CLINICAL DATA:  One month history of cough and shortness of breath. Smoker with history of emphysema. Known cavitary left upper lobe lung mass and left hilar lymphadenopathy.  EXAM: CHEST  2 VIEW  COMPARISON:  Two-view chest x-ray 04/10/2013, 02/17/2013. CT chest 12/21/2012.  FINDINGS: Cardiac silhouette normal in size, unchanged. Left hilar  adenopathy, unchanged. Hilar and mediastinal contours otherwise unremarkable. Cavitary mass with air-fluid level in the lateral left upper lobe, unchanged. View streaky airspace opacities in the right lower lobe. No new abnormalities elsewhere in either lung. Severe bullous emphysematous changes. Visualized bony thorax intact.  IMPRESSION: 1. Acute right lower lobe pneumonia superimposed upon severe COPD/emphysema. 2. Stable cavitary mass in the left upper lobe and left hilar lymphadenopathy.   Electronically Signed   By: Hulan Saas M.D.   On: 07/09/2013 16:49   Dg Chest Port 1 View  07/10/2013   CLINICAL DATA:  Shortness of breath, midsternal chest pain and tachycardia.  EXAM: PORTABLE CHEST - 1 VIEW  COMPARISON:  Chest radiograph performed 07/09/2013  FINDINGS: New bibasilar airspace opacities are seen. This may reflect mild pneumonia or possibly minimal interstitial edema. No pleural effusion or pneumothorax is  seen.  The cardiomediastinal silhouette is normal in size. No acute osseous abnormalities are identified.  IMPRESSION: New bibasilar airspace opacities noted. This may reflect mild pneumonia or possibly minimal interstitial edema, though no ancillary findings are seen to suggest edema.   Electronically Signed   By: Roanna Raider M.D.   On: 07/10/2013 02:24      Assessment/Plan Active Problems:   Atrial fibrillation with rapid ventricular response   1. Atrial fib with RVR: No trolled on a diltiazem drip. 2. Lactic acidosis: Lactate on admission was 4.5. We'll repeat it. 3. Community-acquired pneumonia: Continue azithromycin and start Rocephin. Of note this patient is on 2 L of oxygen at night at home. 4. Tobacco abuse: The patient quit smoking one week ago after 45 years. He requests a nicotine patch. 5. Diabetes: Diabetic diet and check an A1c. The patient says he is able to control with diet most of the time.  *  Code Status: full Disposition Plan: on stepdown due to drip.  Once on PO will be able to move away.  Time spent: 1 hour  Acey Lav [facility] Pager [phone]  If 7PM-7AM, please contact night-coverage [email or web address] Password [email or web address] 07/10/2013, 8:16 AM

## 2013-07-10 NOTE — Progress Notes (Signed)
[facility] PROGRESS NOTE  Kaci Freel Blumenthal ZHY:[id number] DOB: 03-Jan-1961 DOA: 07/10/2013 PCP: Allayne Butcher BETH, PA-C  Assessment/Plan: 1. Atrial fibrillation with rapid ventricular response, not a new diagnosis. Last seen by cardiology 05/2013, at that time had been noted to be in sinus rhythm for more than one year. Currently rate controlled on diltiazem infusion. 2. Right-sided chest pain, constant, present since admission, history and exam suggest secondary to pneumonia; ACS not suspected at this point. Low risk nuclear stress test 04/2013. 3. Community acquired pneumonia; appears to be improving. Continue empiric antibiotics. 4. Elevated lactic acid, likely secondary to rapid heart rate, no signs or symptoms of sepsis at this point 5. COPD, chronic nighttime hypoxia on 2 L currently appears stable;Appears stable. Continue bronchodilators.  6. Coronary artery disease, As above, continue aspirin 7. History of cavitary lung lesions 2012, Dr. Edwyna Shell did a navigational bronchoscopy. Biopsies revealed inflammation and cultures grew MAI. He was treated with antibiotics. He has been followed since that time with serial CT scans. The mass has remained stable over time. 8. Seizure disorder 9. Diabetes mellitus, diet controlled 10. Cigarette smoker(quit one week ago after smoking 45 years), marijuana use   2-D echocardiogram, check TSH. Start oral diltiazem, wean off diltiazem infusion.  Repeat troponin  Continue Rocephin and Zithromax for pneumonia  COPD treatment, night-time oxygen   Transfer to medical floor once of diltiazem infusion, the anticipated discharge within 48 hours  Pending studies:   TSH  Hemoglobin A1c  Lactic acid  Code Status: full code DVT prophylaxis: Lovenox Family Communication: none present Disposition Plan: home when improved  Brendia Sacks, MD  [facility]  Pager [phone] If 7PM-7AM, please contact night-coverage at [email or web address], password  [facility] 07/10/2013, 9:09 AM  LOS: 0 days   Summary: 52 year old man seen in the emergency department 12/27 at [facility], diagnosed with pneumonia, discharged on azithromycin, prescription went unfilled. Presented 12/28 to [facility] with palpitations and shortness of breath and was admitted for atrial fibrillation with rapid ventricular response, lactic acidosis, community acquired pneumonia.  Consultants:    Procedures:    Antibiotics:  Azithromycin 12/28 >>   Ceftriaxone 12/28 >>   HPI/Subjective: Breathing better. Complains of right-sided chest and back pain, "like I have been kicked by a [place]". Overall feels okay. Reports history of atrial fibrillation in the past.  Objective: Filed Vitals:   07/10/13 0800 07/10/13 0815 07/10/13 0830 07/10/13 0845  BP:  102/85 100/75 100/87  Pulse:   79 104  Temp:      TempSrc:      Resp: 14 22 24 31   Height:      Weight:      SpO2:   99% 95%    Intake/Output Summary (Last 24 hours) at 07/10/13 0909 Last data filed at 07/10/13 0700  Gross per 24 hour  Intake 334.17 ml  Output    875 ml  Net -540.83 ml     Filed Weights   07/10/13 0143 07/10/13 0435  Weight: 63.504 kg (140 lb) 63.7 kg (140 lb 6.9 oz)    Exam:   Afebrile, tachypneic, heart rate predominantly less than 100 with no recorded bradycardia last 24 hour  General: Appears calm and comfortable. Nontoxic.  Cardiovascular: Irregular rhythm, normal rate. No lower extremity edema.  Telemetry: Distant heart sounds, Irregular rhythm, rate 60 to 70s  Respiratory: Clear to auscultation bilaterally, fair air movement, no frank wheezes, rales or rhonchi. Mild increased respiratory effort.  Psychiatric: Grossly normal mood and affect. Speech fluent and appropriate.  Data Reviewed:  Capillary blood sugars stable  Basic metabolic panel unremarkable  Initial troponin negative  Lactic acid 4.5  CBC unremarkable except for left-sided shift chest x-ray with bibasilar airspace  opacities suspected to be pneumonia  Initial EKG atrial fibrillation with rapid regular response, nonspecific ST changes   Scheduled Meds: . antiseptic oral rinse  15 mL Mouth Rinse BID  . aspirin EC  81 mg Oral Daily  . azithromycin  250 mg Oral Daily  . cefTRIAXone (ROCEPHIN)  IV  1 g Intravenous Q24H  . enoxaparin (LOVENOX) injection  40 mg Subcutaneous Q24H  . mometasone-formoterol  2 puff Inhalation BID  . nicotine  7 mg Transdermal Daily  . sodium chloride  3 mL Intravenous Q12H  . tiotropium  18 mcg Inhalation Daily   Continuous Infusions: . diltiazem (CARDIZEM) infusion 15 mg/hr (07/10/13 0430)    Active Problems:   Atrial fibrillation with rapid ventricular response   Time spent 25 minutes

## 2013-07-10 NOTE — ED Provider Notes (Signed)
CSN: [id number]     Arrival date & time 07/10/13  0123 History   First MD Initiated Contact with Patient 07/10/13 0141     Chief Complaint  Patient presents with  . Tachycardia  . Chest Pain   (Consider location/radiation/quality/duration/timing/severity/associated sxs/prior Treatment) The history is provided by the patient.   52 year old male had been seen yesterday at [facility] emergency department and diagnosed with pneumonia and sent home with prescription for azithromycin. He has not gotten the prescription filled. He laid down when he went home and noted that his heart was racing. This is associated with a dull left-sided chest pain which she rated it 8/10. There is no associated dyspnea, nausea, vomiting, diaphoresis. Denies fever or chills. Has a history of atrial fibrillation in the past. Nothing makes his knee symptoms better nothing makes his symptoms worse. He does have history of a known mild mass which has been stable.  Past Medical History  Diagnosis Date  . Emphysema   . COPD (chronic obstructive pulmonary disease)   . Hypercholesteremia   . Pneumonia   . Polycythemia   . Coronary artery disease   . 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 Borderline diabetes   . Seizures     last seizure 2 yrs ago  . GERD (gastroesophageal reflux disease)   . Diabetes mellitus   . 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 Chronic back pain   . Bronchitis   . Chronic left shoulder pain   . Chronic neck pain    Past Surgical History  Procedure Laterality Date  . Angioplasty    . Colonoscopy w/ endoscopic [place]    . Vasectomy    . Throat biopsy    . Lung biopsy    . Vasectomy  1987  . Cardiac surgery     Family History  Problem Relation Age of Onset  . Hypertension Mother   . Diabetes Mother   . Heart attack Mother   . Heart failure Mother   . Hypertension Sister   . Diabetes Sister   . Heart  failure Sister    History  Substance Use Topics  . Smoking status: Current Every Day Smoker -- 1.00 packs/day for 45 years    Types: Cigarettes  . Smokeless tobacco: Former Neurosurgeon  . Alcohol Use: No    Review of Systems  All other systems reviewed and are negative.    Allergies  Influenza vaccine live  Home Medications   Current Outpatient Rx  Name  Route  Sig  Dispense  Refill  . albuterol (VENTOLIN HFA) 108 (90 BASE) MCG/ACT inhaler   Inhalation   Inhale 2 puffs into the lungs every 4 (four) hours as needed. Shortness of breath         . aspirin EC 81 MG tablet   Oral   Take 81 mg by mouth daily.         [name] azithromycin (ZITHROMAX) 250 MG tablet   Oral   Take 1 tablet (250 mg total) by mouth daily. Take first 2 tablets together, then 1 every day until finished.   6 tablet   0   . chlorpheniramine-HYDROcodone (TUSSIONEX PENNKINETIC ER) 10-8 MG/5ML LQCR   Oral   Take 5 mLs by mouth every 12 (twelve) hours as needed for cough.   115 mL   0   . Fluticasone-Salmeterol (ADVAIR DISKUS) 250-50 MCG/DOSE AEPB  Inhalation   Inhale 1 puff into the lungs every 12 (twelve) hours.          . nitroGLYCERIN (NITROSTAT) 0.4 MG SL tablet   Sublingual   Place 1 tablet (0.4 mg total) under the tongue every 5 (five) minutes as needed for chest pain.   25 tablet   4   . tiotropium (SPIRIVA) 18 MCG inhalation capsule   Inhalation   Place 18 mcg into inhaler and inhale daily.          There were no vitals taken for this visit. Physical Exam  Nursing note and vitals reviewed.  52 year old male, resting comfortably and in no acute distress. Vital signs are significant for tachypnea with respiratory rate of 26, and tachycardia with heart rate of 165. Oxygen saturation is 96%, which is normal. Head is normocephalic and atraumatic. PERRLA, EOMI. Oropharynx is clear. Neck is nontender and supple without adenopathy or JVD. Back is nontender and there is no CVA  tenderness. Lungs are clear without rales, wheezes, or rhonchi. Chest is nontender. Heart is tachycardic and irregular without murmur. Abdomen is soft, flat, nontender without masses or hepatosplenomegaly and peristalsis is normoactive. Extremities have no cyanosis or edema, full range of motion is present. Skin is warm and dry without rash. Neurologic: Mental status is normal, cranial nerves are intact, there are no motor or sensory deficits.  ED Course  Procedures (including critical care time) Labs Review Results for orders placed during the hospital encounter of 07/10/13  BASIC METABOLIC PANEL      Result Value Range   Sodium 133 (*) 135 - 145 mEq/L   Potassium 4.4  3.5 - 5.1 mEq/L   Chloride 96  96 - 112 mEq/L   CO2 21  19 - 32 mEq/L   Glucose, Bld 301 (*) 70 - 99 mg/dL   BUN 12  6 - 23 mg/dL   Creatinine, Ser 1.61  0.50 - 1.35 mg/dL   Calcium 9.3  8.4 - 09.6 mg/dL   GFR calc non Af Amer 76 (*) >90 mL/min   GFR calc Af Amer 88 (*) >90 mL/min  CBC WITH DIFFERENTIAL      Result Value Range   WBC 9.7  4.0 - 10.5 K/uL   RBC 5.37  4.22 - 5.81 MIL/uL   Hemoglobin 16.3  13.0 - 17.0 g/dL   HCT 04.5  40.9 - 81.1 %   MCV 86.6  78.0 - 100.0 fL   MCH 30.4  26.0 - 34.0 pg   MCHC 35.1  30.0 - 36.0 g/dL   RDW 91.4  78.2 - 95.6 %   Platelets 209  150 - 400 K/uL   Neutrophils Relative % 83 (*) 43 - 77 %   Neutro Abs 8.0 (*) 1.7 - 7.7 K/uL   Lymphocytes Relative 11 (*) 12 - 46 %   Lymphs Abs 1.1  0.7 - 4.0 K/uL   Monocytes Relative 6  3 - 12 %   Monocytes Absolute 0.6  0.1 - 1.0 K/uL   Eosinophils Relative 0  0 - 5 %   Eosinophils Absolute 0.0  0.0 - 0.7 K/uL   Basophils Relative 0  0 - 1 %   Basophils Absolute 0.0  0.0 - 0.1 K/uL  TROPONIN I      Result Value Range   Troponin I <0.30  <0.30 ng/mL  LACTIC ACID, PLASMA      Result Value Range   Lactic Acid, Venous 4.5 (*) 0.5 -  2.2 mmol/L  PRO B NATRIURETIC PEPTIDE      Result Value Range   Pro B Natriuretic peptide (BNP) 79.5   0 - 125 pg/mL    Imaging Review Dg Chest 2 View  07/09/2013   CLINICAL DATA:  One month history of cough and shortness of breath. Smoker with history of emphysema. Known cavitary left upper lobe lung mass and left hilar lymphadenopathy.  EXAM: CHEST  2 VIEW  COMPARISON:  Two-view chest x-ray 04/10/2013, 02/17/2013. CT chest 12/21/2012.  FINDINGS: Cardiac silhouette normal in size, unchanged. Left hilar adenopathy, unchanged. Hilar and mediastinal contours otherwise unremarkable. Cavitary mass with air-fluid level in the lateral left upper lobe, unchanged. View streaky airspace opacities in the right lower lobe. No new abnormalities elsewhere in either lung. Severe bullous emphysematous changes. Visualized bony thorax intact.  IMPRESSION: 1. Acute right lower lobe pneumonia superimposed upon severe COPD/emphysema. 2. Stable cavitary mass in the left upper lobe and left hilar lymphadenopathy.   Electronically Signed   By: Hulan Saas M.D.   On: 07/09/2013 16:49   Dg Chest Port 1 View  07/10/2013   CLINICAL DATA:  Shortness of breath, midsternal chest pain and tachycardia.  EXAM: PORTABLE CHEST - 1 VIEW  COMPARISON:  Chest radiograph performed 07/09/2013  FINDINGS: New bibasilar airspace opacities are seen. This may reflect mild pneumonia or possibly minimal interstitial edema. No pleural effusion or pneumothorax is seen.  The cardiomediastinal silhouette is normal in size. No acute osseous abnormalities are identified.  IMPRESSION: New bibasilar airspace opacities noted. This may reflect mild pneumonia or possibly minimal interstitial edema, though no ancillary findings are seen to suggest edema.   Electronically Signed   By: Roanna Raider M.D.   On: 07/10/2013 02:24    EKG Interpretation    Date/Time:  Sunday July 10 2013 01:28:49 EST Ventricular Rate:  162 PR Interval:    QRS Duration: 80 QT Interval:  280 QTC Calculation: 459 R Axis:   60 Text Interpretation:  Atrial fibrillation  with rapid ventricular response with premature ventricular or aberrantly conducted complexes Nonspecific ST abnormality Abnormal ECG When compared with ECG of 17-Feb-2013 21:53, Atrial fibrillation has replaced Sinus rhythm Vent. rate has increased BY  88 BPM ST now depressed in Inferior leads ST now depressed in Anterior leads ST depression is likely rate-related Confirmed by [facility]  MD, Vayden ([id number]) on 07/10/2013 1:53:45 AM           CRITICAL CARE Performed by: NWGNF,AOZHY Total critical care time: 55 minutes Critical care time was exclusive of separately billable procedures and treating other patients. Critical care was necessary to treat or prevent imminent or life-threatening deterioration. Critical care was time spent personally by me on the following activities: development of treatment plan with patient and/or surrogate as well as nursing, discussions with consultants, evaluation of patient's response to treatment, examination of patient, obtaining history from patient or surrogate, ordering and performing treatments and interventions, ordering and review of laboratory studies, ordering and review of radiographic studies, pulse oximetry and re-evaluation of patient's condition.  MDM   1. Atrial fibrillation with rapid ventricular response   2. Community acquired pneumonia   3. Elevated lactic acid level   4. Hyperglycemia    New-onset atrial fibrillation with rapid ventricular response. Right lower lobe pneumonia. Stable lung mass. He is maintaining an adequate blood pressure, so he will be given diltiazem to try and control his rate. I reviewed his records and it does not appear that he received his  antibiotic prior to discharge from the ED and did not get his prescription filled. He'll be given initial dose of ceftriaxone and azithromycin here. He had wheezing noted when he in the ED earlier today but there is no wheezing at this point.  After diltiazem, heart rate has come down to  100-115. He is resting comfortably. Elevated lactic acid level is noted and this is felt to represent a response to his period of marked tachycardia and not related to his pneumonia. Chest x-ray is now showing possible bilateral infiltrates. Case has been discussed with Dr. Lucretia Roers [facility] and patient admitted to step down unit for ongoing diltiazem and also treatment of his pneumonia.  Dione Booze, MD 07/10/13 [phone]

## 2013-07-10 NOTE — Progress Notes (Signed)
ANTIBIOTIC CONSULT NOTE - INITIAL  Pharmacy Consult for Ceftriaxone Indication: pneumonia  Allergies  Allergen Reactions  . Influenza Vaccine Live Swelling    Patient Measurements: Height: 5\' 8"  (172.7 cm) Weight: 140 lb 6.9 oz (63.7 kg) IBW/kg (Calculated) : 68.4 Adjusted Body Weight:   Vital Signs: Temp: 98.3 F (36.8 C) (12/28 0800) Temp src: Axillary (12/28 0800) BP: 94/64 mmHg (12/28 0900) Pulse Rate: 68 (12/28 0900) Intake/Output from previous day: 12/27 0701 - 12/28 0700 In: 349.2 [I.V.:349.2] Out: 875 [Urine:875] Intake/Output from this shift: Total I/O In: 450 [P.O.:420; I.V.:30] Out: -   Labs:  Recent Labs  07/10/13 0153 07/10/13 0159  WBC  --  9.7  HGB  --  16.3  PLT  --  209  CREATININE 1.08  --    Estimated Creatinine Clearance: 70.5 ml/min (by C-G formula based on Cr of 1.08). No results found for this basename: VANCOTROUGH, Leodis Binet, VANCORANDOM, GENTTROUGH, GENTPEAK, GENTRANDOM, TOBRATROUGH, TOBRAPEAK, TOBRARND, AMIKACINPEAK, AMIKACINTROU, AMIKACIN,  in the last 72 hours   Microbiology: Recent Results (from the past 720 hour(s))  MRSA PCR SCREENING     Status: None   Collection Time    07/10/13  4:45 AM      Result Value Range Status   MRSA by PCR NEGATIVE  NEGATIVE Final   Comment:            The GeneXpert MRSA Assay (FDA     approved for NASAL specimens     only), is one component of a     comprehensive MRSA colonization     surveillance program. It is not     intended to diagnose MRSA     infection nor to guide or     monitor treatment for     MRSA infections.    Medical History: Past Medical History  Diagnosis Date  . Emphysema   . COPD (chronic obstructive pulmonary disease)   . Hypercholesteremia   . Pneumonia   . Polycythemia   . Coronary artery disease   . 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 Borderline diabetes   . Seizures     last seizure 2 yrs ago  . GERD (gastroesophageal  reflux disease)   . Diabetes mellitus   . 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 Chronic back pain   . Bronchitis   . Chronic left shoulder pain   . Chronic neck pain     Medications:  Scheduled:  . antiseptic oral rinse  15 mL Mouth Rinse BID  . aspirin EC  81 mg Oral Daily  . azithromycin  250 mg Oral Daily  . cefTRIAXone (ROCEPHIN)  IV  1 g Intravenous Q24H  . enoxaparin (LOVENOX) injection  40 mg Subcutaneous Q24H  . mometasone-formoterol  2 puff Inhalation BID  . nicotine  7 mg Transdermal Daily  . sodium chloride  3 mL Intravenous Q12H  . tiotropium  18 mcg Inhalation Daily   Assessment: Azithromycin 500 mg IV given in ED, 250 mg tablet daily continued Community acquired pneumonia  Goal of Therapy:  Eradicate infection  Plan:  Ceftriaxone 1 GM IV every 24 hours Labs per protocol  Raquel James, Jermar Colter Bennett 07/10/2013,9:24 AM

## 2013-07-11 DIAGNOSIS — J9600 Acute respiratory failure, unspecified whether with hypoxia or hypercapnia: Secondary | ICD-10-CM

## 2013-07-11 LAB — BASIC METABOLIC PANEL
CO2: 24 mEq/L (ref 19–32)
Calcium: 7.8 mg/dL — ABNORMAL LOW (ref 8.4–10.5)
Chloride: 103 mEq/L (ref 96–112)
GFR calc Af Amer: >90 mL/min (ref >90)
Glucose, Bld: 116 mg/dL — ABNORMAL HIGH (ref 70–99)
Sodium: 136 mEq/L (ref 135–145)

## 2013-07-11 LAB — GLUCOSE, CAPILLARY
Glucose-Capillary: 89 mg/dL (ref 70–99)
Glucose-Capillary: 114 mg/dL — ABNORMAL HIGH (ref 70–99)

## 2013-07-11 LAB — LACTIC ACID, PLASMA: Lactic Acid, Venous: 1.6 mmol/L (ref 0.5–2.2)

## 2013-07-11 MED ORDER — HYDROCODONE-ACETAMINOPHEN 5-325 MG PO TABS
2.0000 | ORAL_TABLET | ORAL | Status: DC | PRN
  Administered 2013-07-11: 2 via ORAL
  Administered 2013-07-11: 1 via ORAL
  Administered 2013-07-11 – 2013-07-12 (×5): 2 via ORAL
  Filled 2013-07-11 (×5): qty 2
  Filled 2013-07-11: qty 1
  Filled 2013-07-11: qty 2

## 2013-07-11 MED ORDER — INSULIN ASPART 100 UNIT/ML ~~LOC~~ SOLN
0.0000 [IU] | Freq: Three times a day (TID) | SUBCUTANEOUS | Status: DC
  Administered 2013-07-12: 1 [IU] via SUBCUTANEOUS

## 2013-07-11 MED ORDER — ALUM & MAG HYDROXIDE-SIMETH 200-200-20 MG/5ML PO SUSP
15.0000 mL | Freq: Four times a day (QID) | ORAL | Status: DC | PRN
  Administered 2013-07-11 – 2013-07-12 (×2): 15 mL via ORAL
  Filled 2013-07-11 (×2): qty 30

## 2013-07-11 NOTE — Care Management Note (Addendum)
    Page 1 of 1   07/12/2013     12:00:09 PM   CARE MANAGEMENT NOTE 07/11/2013  Patient:  Ryan [name], Ryan [name]   Account Number:  [id number]  Date Initiated:  07/11/2013  Documentation initiated by:  Sharrie Rothman  Subjective/Objective Assessment:   Pt admitted from home with a fib. Pt lives with his wife and will return home at discharge. Pt is independent with ADL's.     Action/Plan:   No CM needs noted.   Anticipated DC Date:  07/13/2013   Anticipated DC Plan:  HOME/SELF CARE      DC Planning Services  CM consult      Choice offered to / List presented to:             Status of service:  Completed, signed off Medicare Important Message given?   (If response is "NO", the following Medicare IM given date fields will be blank) Date Medicare IM given:   Date Additional Medicare IM given:    Discharge Disposition:  HOME/SELF CARE  Per UR Regulation:    If discussed at Long Length of Stay Meetings, dates discussed:    Comments:  07/11/13 1515 Arlyss Queen, RN BSN CM

## 2013-07-11 NOTE — Progress Notes (Signed)
ED CM attempted to contact pt regarding COPD f/u appt. Pt noted to be admitted to [facility] hospital.

## 2013-07-11 NOTE — Progress Notes (Signed)
Pt ambulated around the nurses station two times with RN. Pt HR elevated to 105, NSR. Pt tolerated well. Pt encouraged to ambulate. Will continue to monitor.

## 2013-07-11 NOTE — Progress Notes (Signed)
UR chart review completed.  

## 2013-07-11 NOTE — Progress Notes (Addendum)
[facility] PROGRESS NOTE  Ryan [name] ZOX:[id number] DOB: [birth year] DOA: 07/10/2013 PCP: Allayne Butcher BETH, PA-C  Assessment/Plan: 1. Atrial fibrillation with rapid ventricular response, not a new diagnosis. Last seen by cardiology 05/2013, at that time had been noted to be in sinus rhythm for more than one year. Spontaneously converted to sinus rhythm overnight. Continue oral diltiazem. This likely secondary to acute lung infection. Defer anticoagulation he will followup with cardiology as an outpatient. 2. Right-sided chest pain, constant, present since admission, history and exam suggest secondary to pneumonia; ACS not suspected at this point. Low risk nuclear stress test 04/2013. Troponins negative. 3. Acute hypoxic respiratory failure 4. Community acquired pneumonia; slowly improving. Continue empiric antibiotics. 5. Elevated lactic acid, likely secondary to rapid heart rate, no signs or symptoms of sepsis at this point. Now within normal limits. 6. COPD, chronic nighttime hypoxia on 2 L currently appears stable; Continue bronchodilators.  7. Coronary artery disease, stable, continue aspirin 8. History of cavitary lung lesions 2012, Dr. Edwyna Shell did a navigational bronchoscopy. Biopsies revealed inflammation and cultures grew MAI. He was treated with antibiotics. He has been followed since that time with serial CT scans. The mass has remained stable over time. 9. Seizure disorder? Not on AED. Monitor clinically. 10. Diabetes mellitus, diet controlled. Add sliding scale insulin. 11. Cigarette smoker(quit one week ago after smoking 45 years), marijuana use   Wean oxygen as tolerated  Sliding scale insulin  Transfer to telemetry  If remains stable, likely home within 48 hours  Pending studies:   none  Code Status: full code DVT prophylaxis: Lovenox Family Communication: none present Disposition Plan: home when improved  Brendia Sacks, MD  [facility]  Pager  [phone] If 7PM-7AM, please contact night-coverage at [email or web address], password [facility] 07/11/2013, 7:44 AM  LOS: 1 day   Summary: 52 year old man seen in the emergency department 12/27 at [facility], diagnosed with pneumonia, discharged on azithromycin, prescription went unfilled. Presented 12/28 to [facility] with palpitations and shortness of breath and was admitted for atrial fibrillation with rapid ventricular response, lactic acidosis, community acquired pneumonia.  Consultants:    Procedures:    Antibiotics:  Azithromycin 12/28 >>   Ceftriaxone 12/28 >>   HPI/Subjective: Continues to have right-sided chest pain, discomfort from coughing. Breathing okay. Converted to sinus rhythm overnight. No issues charted overnight.  Objective: Filed Vitals:   07/11/13 0555 07/11/13 0600 07/11/13 0700 07/11/13 0732  BP: 102/73 101/75 104/70   Pulse:  58 56   Temp:      TempSrc:      Resp:  15 15   Height:      Weight:      SpO2:  95% 96% 96%    Intake/Output Summary (Last 24 hours) at 07/11/13 0744 Last data filed at 07/11/13 0700  Gross per 24 hour  Intake 3489.83 ml  Output   1300 ml  Net 2189.83 ml     Filed Weights   07/10/13 0143 07/10/13 0435 07/11/13 0445  Weight: 63.504 kg (140 lb) 63.7 kg (140 lb 6.9 oz) 67.8 kg (149 lb 7.6 oz)    Exam:   Afebrile, vitals stable. Tachycardia resolved. Normotensive. Hypoxic, stable on 3 L.  General: Appears calm and comfortable. Sitting on side of the bed.  Cardiovascular: Regular rate and rhythm. No murmur, rub or gallop. No lower extremity edema.  Telemetry: Sinus rhythm.  Respiratory: Decreased air movement in the right, no frank rhonchi or rales. Mild increased respiratory effort. Left lung fields clear without wheezes, rales  or rhonchi.  Data Reviewed:  +1.6 L since admission. Adequate urine output.  BM x3  Lactic acid normal.  Scheduled Meds: . antiseptic oral rinse  15 mL Mouth Rinse BID  . aspirin EC  81 mg Oral Daily  .  azithromycin  250 mg Oral Daily  . cefTRIAXone (ROCEPHIN)  IV  1 g Intravenous Q24H  . diltiazem  60 mg Oral Q6H  . enoxaparin (LOVENOX) injection  40 mg Subcutaneous Q24H  . mometasone-formoterol  2 puff Inhalation BID  . nicotine  7 mg Transdermal Daily  . sodium chloride  3 mL Intravenous Q12H  . tiotropium  18 mcg Inhalation Daily   Continuous Infusions: . sodium chloride 75 mL/hr at 07/11/13 0302  . diltiazem (CARDIZEM) infusion Stopped (07/10/13 1200)    Principal Problem:   Atrial fibrillation with rapid ventricular response Active Problems:   COPD   Cavitary lesion of lung   CAP (community acquired pneumonia)   Time spent 20 minutes

## 2013-07-12 ENCOUNTER — Encounter (HOSPITAL_COMMUNITY): Payer: Self-pay | Admitting: Cardiology

## 2013-07-12 ENCOUNTER — Other Ambulatory Visit: Payer: Self-pay

## 2013-07-12 DIAGNOSIS — I2510 Atherosclerotic heart disease of native coronary artery without angina pectoris: Secondary | ICD-10-CM

## 2013-07-12 DIAGNOSIS — I7090 Unspecified atherosclerosis: Secondary | ICD-10-CM

## 2013-07-12 LAB — GLUCOSE, CAPILLARY
Glucose-Capillary: 121 mg/dL — ABNORMAL HIGH (ref 70–99)
Glucose-Capillary: 98 mg/dL (ref 70–99)

## 2013-07-12 MED ORDER — HYDROCODONE-ACETAMINOPHEN 5-325 MG PO TABS: 1.0000 | ORAL_TABLET | ORAL | Status: DC | PRN

## 2013-07-12 MED ORDER — DILTIAZEM HCL ER COATED BEADS 240 MG PO CP24: 240.0000 mg | ORAL_CAPSULE | Freq: Every day | ORAL | Status: DC

## 2013-07-12 MED ORDER — NICOTINE 7 MG/24HR TD PT24: 7.0000 mg | MEDICATED_PATCH | Freq: Every day | TRANSDERMAL | Status: DC

## 2013-07-12 MED ORDER — CEFUROXIME AXETIL 500 MG PO TABS: 500.0000 mg | ORAL_TABLET | Freq: Two times a day (BID) | ORAL | Status: DC

## 2013-07-12 MED ORDER — CEFUROXIME AXETIL 250 MG PO TABS: 500.0000 mg | ORAL_TABLET | Freq: Two times a day (BID) | ORAL | Status: DC

## 2013-07-12 MED ORDER — DILTIAZEM HCL ER COATED BEADS 240 MG PO CP24
240.0000 mg | ORAL_CAPSULE | Freq: Every day | ORAL | Status: DC
  Administered 2013-07-12: 240 mg via ORAL
  Filled 2013-07-12: qty 1

## 2013-07-12 MED ORDER — AZITHROMYCIN 250 MG PO TABS: 250.0000 mg | ORAL_TABLET | Freq: Every day | ORAL | Status: DC

## 2013-07-12 NOTE — Progress Notes (Signed)
[facility] PROGRESS NOTE  Ryan [name] ZOX:[id number] DOB: 01/20/1961 DOA: 07/10/2013 PCP: Allayne Butcher BETH, PA-C  Assessment/Plan: 1. Atrial fibrillation with rapid ventricular response, not a new diagnosis. Last seen by cardiology 05/2013, at that time had been noted to be in sinus rhythm for more than one year. Spontaneously converted to sinus rhythm on diltiazem infusion. Remains in sinus rhythm. Continue oral diltiazem. This likely secondary to acute lung infection. Cardiology consult to comment on anticoagulation. 2. Acute hypoxic respiratory failure, secondary to pneumonia. Resolved.. 3. Community acquired pneumonia; continues to improve. Change to oral antibiotics.[name] 4. Right-sided chest pain, constant, present since admission, history and exam suggest secondary to pneumonia; ACS not suspected. Low risk nuclear stress test 04/2013. Troponins negative. 2-D echocardiogram reassuring. 5. Elevated lactic acid, likely secondary to rapid heart rate, no signs or symptoms of sepsis at this point. Now within normal limits. 6. COPD, chronic nighttime hypoxia on 2 L currently appears stable; Continue bronchodilators.  7. Coronary artery disease, stable, continue aspirin 8. History of cavitary lung lesions 2012, Dr. Edwyna Shell did a navigational bronchoscopy. Biopsies revealed inflammation and cultures grew MAI. He was treated with antibiotics. He has been followed since that time with serial CT scans. The mass has remained stable over time. 9. Seizure disorder? Not on AED. Monitor clinically. 10. Diabetes mellitus, diet controlled. Blood sugars well controlled. 11. Cigarette smoker (quit one week ago after smoking 45 years), marijuana use. Wants to quit. Interested in nicotine patch.   Consult cardiology for recommendations, anticoagulation?  Home later today on oral diltiazem, complete antibiotics, nicotine patch.  Discussed with wife at bedside.  Pending studies:   none  Code Status:  full code DVT prophylaxis: Lovenox Family Communication:  Disposition Plan:   Brendia Sacks, MD  [facility]  Pager [phone] If 7PM-7AM, please contact night-coverage at [email or web address], password [facility] 07/12/2013, 7:58 AM  LOS: 2 days   Summary: 52 year old man seen in the emergency department 12/27 at [facility], diagnosed with pneumonia, discharged on azithromycin, prescription went unfilled. Presented 12/28 to [facility] with palpitations and shortness of breath and was admitted for atrial fibrillation with rapid ventricular response, lactic acidosis, community acquired pneumonia.  Consultants:    Procedures:  2-D echocardiogram: Left ventricular ejection fraction 60-65%. Normal wall motion. No regional wall motion abnormalities.  Antibiotics:  Azithromycin 12/28 >> 1/1  Ceftriaxone 12/28 >> 12/29  Ceftin 12/30 >> 1/3  HPI/Subjective: Feels much better. Pain well-controlled. Breathing better. No new issues. Has remained in sinus rhythm overnight.  Objective: Filed Vitals:   07/11/13 1932 07/11/13 2000 07/11/13 2100 07/11/13 2123  BP:  114/69    Pulse:  86    Temp:    97.5 F (36.4 C)  TempSrc:      Resp:   19   Height:      Weight:      SpO2: 98% 94%      Intake/Output Summary (Last 24 hours) at 07/12/13 0758 Last data filed at 07/12/13 0700  Gross per 24 hour  Intake   1200 ml  Output   3800 ml  Net  -2600 ml     Filed Weights   07/10/13 0143 07/10/13 0435 07/11/13 0445  Weight: 63.504 kg (140 lb) 63.7 kg (140 lb 6.9 oz) 67.8 kg (149 lb 7.6 oz)    Exam:   Afebrile, vital signs stable. No hypoxia.  General: Appears calm and comfortable. Well-appearing. Speech fluent and clear.  Cardiovascular: Regular rate and rhythm. No murmur, rub or gallop.  Telemetry: Sinus  rhythm  Respiratory: Clear to auscultation bilaterally. No wheezes, rales or rhonchi. Normal respiratory effort.  Data Reviewed:  -936 since admission. Excellent urine output.  BM  x1  Scheduled Meds: . antiseptic oral rinse  15 mL Mouth Rinse BID  . aspirin EC  81 mg Oral Daily  . azithromycin  250 mg Oral Daily  . cefTRIAXone (ROCEPHIN)  IV  1 g Intravenous Q24H  . diltiazem  60 mg Oral Q6H  . enoxaparin (LOVENOX) injection  40 mg Subcutaneous Q24H  . insulin aspart  0-9 Units Subcutaneous TID WC  . mometasone-formoterol  2 puff Inhalation BID  . nicotine  7 mg Transdermal Daily  . sodium chloride  3 mL Intravenous Q12H  . tiotropium  18 mcg Inhalation Daily   Continuous Infusions:    Principal Problem:   Atrial fibrillation with rapid ventricular response Active Problems:   COPD   Cavitary lesion of lung   CAP (community acquired pneumonia)

## 2013-07-12 NOTE — Progress Notes (Signed)
Patient given discharge instructions. Patient alert, oriented and in stable condition at the time of discharge. Patient verbalized understanding of all discharge instructions. Patient states his PCP refuses to see him because he "owes them 80 dollars". The PCP's office called while I was reviewing DC instructions and made an appointment for January 7th at 1030AM. Patient being discharged home with wife.

## 2013-07-12 NOTE — Progress Notes (Signed)
[facility] PROGRESS NOTE  Ryan [name] ZOX:[id number] DOB: 01-30-1961 DOA: 07/10/2013 PCP: Frazier Richards, PA-C  Addendum: discussed with Dr. Diona Browner. CHADSVASC score 2. Follow-up with cardiology in next few weeks. Anticoagulation was discussed, patient will consider. Continue aspirin.  Assessment/Plan: 1. Atrial fibrillation with rapid ventricular response, not a new diagnosis. Last seen by cardiology 05/2013, at that time had been noted to be in sinus rhythm for more than one year. Spontaneously converted to sinus rhythm on diltiazem infusion. Remains in sinus rhythm. Continue oral diltiazem. This likely secondary to acute lung infection. Cardiology consult to comment on anticoagulation. 2. Acute hypoxic respiratory failure, secondary to pneumonia. Resolved.. 3. Community acquired pneumonia; continues to improve. Change to oral antibiotics.[name] 4. Right-sided chest pain, constant, present since admission, history and exam suggest secondary to pneumonia; ACS not suspected. Low risk nuclear stress test 04/2013. Troponins negative. 2-D echocardiogram reassuring. 5. Elevated lactic acid, likely secondary to rapid heart rate, no signs or symptoms of sepsis at this point. Now within normal limits. 6. COPD, chronic nighttime hypoxia on 2 L currently appears stable; Continue bronchodilators.  7. Coronary artery disease, stable, continue aspirin 8. History of cavitary lung lesions 2012, Dr. Edwyna Shell did a navigational bronchoscopy. Biopsies revealed inflammation and cultures grew MAI. He was treated with antibiotics. He has been followed since that time with serial CT scans. The mass has remained stable over time. 9. Seizure disorder? Not on AED. Monitor clinically. 10. Diabetes mellitus, diet controlled. Blood sugars well controlled. 11. Cigarette smoker (quit one week ago after smoking 45 years), marijuana use. Wants to quit. Interested in nicotine patch.   Consult cardiology for  recommendations, anticoagulation?  Home later today on oral diltiazem, complete antibiotics, nicotine patch.  Discussed with wife at bedside.  Pending studies:   none  Code Status: full code DVT prophylaxis: Lovenox Family Communication:  Disposition Plan:   Brendia Sacks, MD  [facility]  Pager [phone] If 7PM-7AM, please contact night-coverage at [email or web address], password [facility] 07/12/2013, 11:01 AM  LOS: 2 days   Summary: 52 year old man seen in the emergency department 12/27 at [facility], diagnosed with pneumonia, discharged on azithromycin, prescription went unfilled. Presented 12/28 to [facility] with palpitations and shortness of breath and was admitted for atrial fibrillation with rapid ventricular response, lactic acidosis, community acquired pneumonia.  Consultants:    Procedures:  2-D echocardiogram: Left ventricular ejection fraction 60-65%. Normal wall motion. No regional wall motion abnormalities.  Antibiotics:  Azithromycin 12/28 >> 1/1  Ceftriaxone 12/28 >> 12/29  Ceftin 12/30 >> 1/3  HPI/Subjective: Feels much better. Pain well-controlled. Breathing better. No new issues. Has remained in sinus rhythm overnight.  Objective: Filed Vitals:   07/11/13 2100 07/11/13 2123 07/12/13 0730 07/12/13 0816  BP:      Pulse:      Temp:  97.5 F (36.4 C) 97.8 F (36.6 C)   TempSrc:   Oral   Resp: 19     Height:      Weight:      SpO2:    94%    Intake/Output Summary (Last 24 hours) at 07/12/13 1101 Last data filed at 07/12/13 0700  Gross per 24 hour  Intake    860 ml  Output   3800 ml  Net  -2940 ml     Filed Weights   07/10/13 0143 07/10/13 0435 07/11/13 0445  Weight: 63.504 kg (140 lb) 63.7 kg (140 lb 6.9 oz) 67.8 kg (149 lb 7.6 oz)    Exam:   Afebrile, vital signs  stable. No hypoxia.  General: Appears calm and comfortable. Well-appearing. Speech fluent and clear.  Cardiovascular: Regular rate and rhythm. No murmur, rub or gallop.  Telemetry:  Sinus rhythm  Respiratory: Clear to auscultation bilaterally. No wheezes, rales or rhonchi. Normal respiratory effort.  Data Reviewed:  -936 since admission. Excellent urine output.  BM x1  Scheduled Meds: . antiseptic oral rinse  15 mL Mouth Rinse BID  . aspirin EC  81 mg Oral Daily  . azithromycin  250 mg Oral Daily  . cefUROXime  500 mg Oral BID WC  . diltiazem  240 mg Oral Daily  . enoxaparin (LOVENOX) injection  40 mg Subcutaneous Q24H  . insulin aspart  0-9 Units Subcutaneous TID WC  . mometasone-formoterol  2 puff Inhalation BID  . nicotine  7 mg Transdermal Daily  . sodium chloride  3 mL Intravenous Q12H  . tiotropium  18 mcg Inhalation Daily   Continuous Infusions:    Principal Problem:   Atrial fibrillation with rapid ventricular response Active Problems:   COPD   Cavitary lesion of lung   CAP (community acquired pneumonia)

## 2013-07-12 NOTE — Consult Note (Signed)
Primary cardiologist: Previously Dr. Sulphur Bing Consulting cardiologist: Dr. Jonelle Sidle  Clinical Summary Ryan [name] is a 52 y.o.male with past medical history outlined below, currently admitted after an episode of rapid atrial fibrillation that occurred in the setting of an upper respiratory tract infection. He was seen recently at [facility] long ED over the weekend with plan to start outpatient antibiotics. He had not actually started his medications as yet, he felt a sense of palpitations that prompted his assessment at [facility]. He spontaneously converted to sinus rhythm on intravenous diltiazem.  History includes prior documented atrial fibrillation that has been managed conservatively, not on anticoagulation, with no major recurrences over the last few years. He has been following with Ms. Lawrence NP in the clinic, most recently in November. CHADSVASC score is 2.  Recent ischemic evaluation noted with low risk Lexiscan Cardiolite in October, probable diaphragmatic attenuation, less likely inferior septal scar with LVEF 49%. He has a previously documented history of mild nonobstructive CAD at cardiac catheterization 2007.  ECG reviewed while patient in atrial fibrillation, only nonspecific ST segment changes noted. Recent echocardiogram confirms normal LVEF of 60-65% without regional wall motion abnormalities.   Allergies  Allergen Reactions  . Influenza Vaccine Live Swelling    Medications Scheduled Medications: . antiseptic oral rinse  15 mL Mouth Rinse BID  . aspirin EC  81 mg Oral Daily  . azithromycin  250 mg Oral Daily  . cefUROXime  500 mg Oral BID WC  . diltiazem  240 mg Oral Daily  . enoxaparin (LOVENOX) injection  40 mg Subcutaneous Q24H  . insulin aspart  0-9 Units Subcutaneous TID WC  . mometasone-formoterol  2 puff Inhalation BID  . nicotine  7 mg Transdermal Daily  . sodium chloride  3 mL Intravenous Q12H  . tiotropium  18 mcg Inhalation Daily     PRN Medications: acetaminophen, acetaminophen, albuterol, alum & mag hydroxide-simeth, guaiFENesin-dextromethorphan, HYDROcodone-acetaminophen, nitroGLYCERIN   Past Medical History  Diagnosis Date  . COPD (chronic obstructive pulmonary disease)   . Hypercholesteremia   . History of pneumonia   . Polycythemia   . Coronary atherosclerosis of native coronary artery     Mild nonobstructive disease at catheterization 2007  . 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 Borderline diabetes   . Seizures     Last seizure 2 yrs ago  . GERD (gastroesophageal reflux disease)   . Type 2 diabetes mellitus   . DDD (degenerative disc disease)     Cervical and thoracic  . Chronic back pain   . Bronchitis   . Chronic left shoulder pain   . Chronic neck pain   . Atrial fibrillation     Not anticoagulated    Past Surgical History  Procedure Laterality Date  . Colonoscopy w/ endoscopic [place]    . Vasectomy    . Throat biopsy    . Lung biopsy    . Vasectomy  1987    Family History  Problem Relation Age of Onset  . Hypertension Mother   . Diabetes Mother   . Heart attack Mother   . Heart failure Mother   . Hypertension Sister   . Diabetes Sister   . Heart failure Sister     Social History Ryan [name] reports that he has been smoking Cigarettes.  He has a 45 pack-year smoking history. He has quit using smokeless tobacco. Ryan [name] reports that he does not drink alcohol.  Review of Systems Recent cough and chest congestion. Atypical chest pain. No orthopnea or PND. No syncope. No reported bleeding episodes. No seizures. Otherwise negative  Physical Examination Blood pressure 114/69, pulse 86, temperature 97.8 F (36.6 C), temperature source Oral, resp. rate 19, height 5\' 8"  (1.727 m), weight 149 lb 7.6 oz (67.8 kg), SpO2 94.00%.  Intake/Output Summary (Last 24 hours) at 07/12/13 1045 Last data filed at 07/12/13 0700  Gross per 24 hour  Intake     860 ml  Output   3800 ml  Net  -2940 ml   Telemetry: Normal sinus rhythm.  Patient appears comfortable at rest. HEENT: Conjunctiva and lids normal, oropharynx clear. Neck: Supple, no elevated JVP or carotid bruits, no thyromegaly. Lungs: Clear to auscultation, nonlabored breathing at rest. Cardiac: Regular rate and rhythm, no S3 or significant systolic murmur, no pericardial rub. Abdomen: Soft, nontender, bowel sounds present. Extremities: No pitting edema, distal pulses 2+. Skin: Warm and dry. Musculoskeletal: No kyphosis. Neuropsychiatric: Alert and oriented x3, affect grossly appropriate.   Lab Results  Basic Metabolic Panel:  Recent Labs Lab 07/10/13 0153 07/10/13 0847 07/11/13 0457  NA 133*  --  136  K 4.4  --  4.7  CL 96  --  103  CO2 21  --  24  GLUCOSE 301*  --  116*  BUN 12  --  13  CREATININE 1.08  --  1.05  CALCIUM 9.3  --  7.8*  MG  --  1.9  --   PHOS  --  3.9  --     CBC:  Recent Labs Lab 07/10/13 0159  WBC 9.7  NEUTROABS 8.0*  HGB 16.3  HCT 46.5  MCV 86.6  PLT 209    Cardiac Enzymes:  Recent Labs Lab 07/10/13 0159 07/10/13 1030  TROPONINI <0.30 <0.30    Imaging PORTABLE CHEST - 1 VIEW  COMPARISON: Chest radiograph performed 07/09/2013  FINDINGS: New bibasilar airspace opacities are seen. This may reflect mild pneumonia or possibly minimal interstitial edema. No pleural effusion or pneumothorax is seen.  The cardiomediastinal silhouette is normal in size. No acute osseous abnormalities are identified.  IMPRESSION: New bibasilar airspace opacities noted. This may reflect mild pneumonia or possibly minimal interstitial edema, though no ancillary findings are seen to suggest edema.   Impression  1. Transient episode of atrial fibrillation with rapid ventricular response, spontaneously converted to sinus rhythm with intravenous diltiazem. CHADSVASC score 2. He has not had been anticoagulated long-term. No primary care  physician. Cardiology followup has been somewhat sporadic. Recent echocardiogram revealed LVEF 60-65%. Does have some atypical chest pain symptoms, cardiac markers negative and recent Cardiolite was low risk.  2. History of mild, nonobstructive CAD at cardiac catheterization 2007. Recent followup Cardiolite was low risk.  3. Recent diagnosis of pneumonia, just getting started on antibiotics. Possible precipitant for problem #1.  4. Known COPD.   Recommendations  Discussed with patient and wife. Agree with initiation of Cardizem CD, already started by the hospitalist team. Patient will likely be discharged home today. He will need office followup in the next few weeks to ensure clinical stability from a cardiac perspective. We did discuss the possible option of anticoagulation for stroke prophylaxis, and he states that he will consider this further. For now continue aspirin. Compliance with followup will be of critical importance.   Jonelle Sidle, M.D., F.A.C.C.

## 2013-07-12 NOTE — Discharge Summary (Signed)
Physician Discharge Summary  Ryan [name] Dufrane ZOX:[id number] DOB: [birth year] DOA: 07/10/2013  PCP: Ryan Richards, PA-C  Admit date: 07/10/2013 Discharge date: 07/12/2013  Recommendations for Outpatient Follow-up:  1. Atrial fibrillation, converted to sinus rhythm spontaneously during hospitalization. Consider anticoagulation. 2. Resolution of pneumonia 3. Continue to encourage smoking cessation   Follow-up Information   Follow up with [name], PA-C In 1 week.   Specialty:  Physician Assistant   Contact information:   [address] [place] [address] [phone]      Discharge Diagnoses:  1. Atrial fibrillation with rapid ventricular response 2. Acute hypoxic respiratory failure secondary to pneumonia 3. Community acquired pneumonia 4. Atypical chest pain 5. Elevated lactic acid 6. COPD, chronic nighttime hypoxia 7. History of coronary artery disease 8. Diabetes mellitus diet controlled 9. Tobacco dependence  Discharge Condition: Improved Disposition: Home  Diet recommendation: Heart healthy diabetic diet  Filed Weights   07/10/13 0143 07/10/13 0435 07/11/13 0445  Weight: 63.504 kg (140 lb) 63.7 kg (140 lb 6.9 oz) 67.8 kg (149 lb 7.6 oz)    History of present illness:  52 year old man seen in the emergency department 12/27 at [facility], diagnosed with pneumonia, discharged on azithromycin, prescription went unfilled. Presented 12/28 to [facility] with palpitations and shortness of breath and was admitted for atrial fibrillation with rapid ventricular response, lactic acidosis, community acquired pneumonia.  Hospital Course:  Ryan [name] was treated with antibiotics for pneumonia and rapidly improved with resolution of hypoxia and right-sided chest pain. Atrial fibrillation converted to sinus rhythm with diltiazem infusion and has remained stable on oral diltiazem. Elevated lactic acid was likely secondary to rapid heart rate. No evidence of sepsis. Cardiology saw him in  consultation and recommended no anticoagulation at this point. Continue aspirin and diltiazem. Outpatient followup will be arranged by cardiology. Individual is used as below.  1. Atrial fibrillation with rapid ventricular response, not a new diagnosis. Spontaneously converted to sinus rhythm on diltiazem infusion. Remains in sinus rhythm. Continue oral diltiazem. Cardiology has recommended aspirin and outpatient followup. 2. Acute hypoxic respiratory failure, secondary to pneumonia. Resolved.. 3. Community acquired pneumonia; continues to improve. Complete oral antibiotics.[name] 4. Right-sided chest pain, constant, present since admission, history and exam suggest secondary to pneumonia; ACS not suspected. Low risk nuclear stress test 04/2013. Troponins negative. 2-D echocardiogram reassuring. 5. Elevated lactic acid, likely secondary to rapid heart rate, no signs or symptoms of sepsis at this point. Now within normal limits. 6. COPD, chronic nighttime hypoxia on 2 L currently appears stable; Continue bronchodilators.  7. Coronary artery disease, stable, continue aspirin 8. History of cavitary lung lesions 2012, Dr. Edwyna [name] did a navigational bronchoscopy. Biopsies revealed inflammation and cultures grew MAI. He was treated with antibiotics. He has been followed since that time with serial CT scans. The mass has remained stable over time. 9. Seizure disorder? Not on AED. Monitor clinically. 10. Diabetes mellitus, diet controlled. Blood sugars well controlled. 11. Cigarette smoker (quit one week ago after smoking 45 years), marijuana use. Wants to quit. Interested in nicotine patch.  Consultants: cardiology Procedures:  2-D echocardiogram: Left ventricular ejection fraction 60-65%. Normal wall motion. No regional wall motion abnormalities. Antibiotics:  Azithromycin 12/28 >> 1/1  Ceftriaxone 12/28 >> 12/29  Ceftin 12/30 >> 1/3   Discharge Instructions  Discharge Orders   Future Appointments  Provider Department Dept Phone   07/20/2013 1:50 PM Jodelle Gross, NP [facility] [place] [phone]   Future Orders Complete By Expires   Activity  as tolerated - No restrictions  As directed    Diet - low sodium heart healthy  As directed    Diet Carb Modified  As directed    Discharge instructions  As directed    Comments:     Call your physician or seek immediate medical attention for chest pain, rapid heart rate, shortness of breath or worsening of condition.       Medication List         ADVAIR DISKUS 250-50 MCG/DOSE Aepb  Generic drug:  Fluticasone-Salmeterol  Inhale 1 puff into the lungs every 12 (twelve) hours.     aspirin EC 81 MG tablet  Take 81 mg by mouth daily.     azithromycin 250 MG tablet  Commonly known as:  ZITHROMAX  Take 1 tablet (250 mg total) by mouth daily. Start 12/31 in the morning.     cefUROXime 500 MG tablet  Commonly known as:  CEFTIN  Take 1 tablet (500 mg total) by mouth 2 (two) times daily with a meal.     diltiazem 240 MG 24 hr capsule  Commonly known as:  CARDIZEM CD  Take 1 capsule (240 mg total) by mouth daily.     HYDROcodone-acetaminophen 5-325 MG per tablet  Commonly known as:  NORCO/VICODIN  Take 1-2 tablets by mouth every 4 (four) hours as needed for moderate pain.     nicotine 7 mg/24hr patch  Commonly known as:  NICODERM CQ - dosed in mg/24 hr  Place 1 patch (7 mg total) onto the skin daily.     nitroGLYCERIN 0.4 MG SL tablet  Commonly known as:  NITROSTAT  Place 1 tablet (0.4 mg total) under the tongue every 5 (five) minutes as needed for chest pain.     tiotropium 18 MCG inhalation capsule  Commonly known as:  SPIRIVA  Place 18 mcg into inhaler and inhale daily.     VENTOLIN HFA 108 (90 BASE) MCG/ACT inhaler  Generic drug:  albuterol  Inhale 2 puffs into the lungs every 4 (four) hours as needed. Shortness of breath       Allergies  Allergen Reactions  . Influenza Vaccine Live Swelling    The results  of significant diagnostics from this hospitalization (including imaging, microbiology, ancillary and laboratory) are listed below for reference.    Significant Diagnostic Studies: Dg Chest 2 View  07/09/2013   CLINICAL DATA:  One month history of cough and shortness of breath. Smoker with history of emphysema. Known cavitary left upper lobe lung mass and left hilar lymphadenopathy.  EXAM: CHEST  2 VIEW  COMPARISON:  Two-view chest x-ray 04/10/2013, 02/17/2013. CT chest 12/21/2012.  FINDINGS: Cardiac silhouette normal in size, unchanged. Left hilar adenopathy, unchanged. Hilar and mediastinal contours otherwise unremarkable. Cavitary mass with air-fluid level in the lateral left upper lobe, unchanged. View streaky airspace opacities in the right lower lobe. No new abnormalities elsewhere in either lung. Severe bullous emphysematous changes. Visualized bony thorax intact.  IMPRESSION: 1. Acute right lower lobe pneumonia superimposed upon severe COPD/emphysema. 2. Stable cavitary mass in the left upper lobe and left hilar lymphadenopathy.   Electronically Signed   By: Hulan Saas M.D.   On: 07/09/2013 16:49   Dg Chest Port 1 View  07/10/2013   CLINICAL DATA:  Shortness of breath, midsternal chest pain and tachycardia.  EXAM: PORTABLE CHEST - 1 VIEW  COMPARISON:  Chest radiograph performed 07/09/2013  FINDINGS: New bibasilar airspace opacities are seen. This may reflect mild pneumonia or possibly  minimal interstitial edema. No pleural effusion or pneumothorax is seen.  The cardiomediastinal silhouette is normal in size. No acute osseous abnormalities are identified.  IMPRESSION: New bibasilar airspace opacities noted. This may reflect mild pneumonia or possibly minimal interstitial edema, though no ancillary findings are seen to suggest edema.   Electronically Signed   By: Roanna Raider M.D.   On: 07/10/2013 02:24    Microbiology: Recent Results (from the past 240 hour(s))  MRSA PCR SCREENING      Status: None   Collection Time    07/10/13  4:45 AM      Result Value Range Status   MRSA by PCR NEGATIVE  NEGATIVE Final   Comment:            The GeneXpert MRSA Assay (FDA     approved for NASAL specimens     only), is one component of a     comprehensive MRSA colonization     surveillance program. It is not     intended to diagnose MRSA     infection nor to guide or     monitor treatment for     MRSA infections.     Labs: Basic Metabolic Panel:  Recent Labs Lab 07/10/13 0153 07/10/13 0847 07/11/13 0457  NA 133*  --  136  K 4.4  --  4.7  CL 96  --  103  CO2 21  --  24  GLUCOSE 301*  --  116*  BUN 12  --  13  CREATININE 1.08  --  1.05  CALCIUM 9.3  --  7.8*  MG  --  1.9  --   PHOS  --  3.9  --    CBC:  Recent Labs Lab 07/10/13 0159  WBC 9.7  NEUTROABS 8.0*  HGB 16.3  HCT 46.5  MCV 86.6  PLT 209   Cardiac Enzymes:  Recent Labs Lab 07/10/13 0159 07/10/13 1030  TROPONINI <0.30 <0.30    Recent Labs  07/10/13 0159  PROBNP 79.5   CBG:  Recent Labs Lab 07/10/13 0736 07/11/13 1722 07/11/13 2121 07/12/13 0724  GLUCAP 93 89 114* 121*    Principal Problem:   Atrial fibrillation with rapid ventricular response Active Problems:   COPD   Cavitary lesion of lung   CAP (community acquired pneumonia)   Time coordinating discharge: 35 minutes  Signed:  Brendia Sacks, MD [facility] 07/12/2013, 11:07 AM

## 2013-07-20 ENCOUNTER — Ambulatory Visit (INDEPENDENT_AMBULATORY_CARE_PROVIDER_SITE_OTHER): Payer: Medicare Other | Admitting: Adult Health

## 2013-07-20 ENCOUNTER — Encounter: Payer: Self-pay | Admitting: Physician Assistant

## 2013-07-20 ENCOUNTER — Encounter: Payer: Self-pay | Admitting: Adult Health

## 2013-07-20 ENCOUNTER — Ambulatory Visit (INDEPENDENT_AMBULATORY_CARE_PROVIDER_SITE_OTHER): Payer: Medicare Other | Admitting: Physician Assistant

## 2013-07-20 VITALS — BP 122/70 | HR 80 | Temp 98.3°F | Resp 18 | Ht 65.5 in | Wt 144.0 lb

## 2013-07-20 VITALS — BP 114/70 | HR 81 | Ht 68.0 in | Wt 142.0 lb

## 2013-07-20 DIAGNOSIS — I2510 Atherosclerotic heart disease of native coronary artery without angina pectoris: Secondary | ICD-10-CM | POA: Diagnosis not present

## 2013-07-20 DIAGNOSIS — J189 Pneumonia, unspecified organism: Secondary | ICD-10-CM | POA: Diagnosis not present

## 2013-07-20 DIAGNOSIS — I4891 Unspecified atrial fibrillation: Secondary | ICD-10-CM

## 2013-07-20 DIAGNOSIS — F17200 Nicotine dependence, unspecified, uncomplicated: Secondary | ICD-10-CM

## 2013-07-20 DIAGNOSIS — J449 Chronic obstructive pulmonary disease, unspecified: Secondary | ICD-10-CM | POA: Diagnosis not present

## 2013-07-20 DIAGNOSIS — I7090 Unspecified atherosclerosis: Secondary | ICD-10-CM

## 2013-07-20 NOTE — Assessment & Plan Note (Signed)
Paroxysmal. Related to acute illness with pneumonia. Will not start on anticoagulation unless he has recurrence outside of illness. Continue Cardizem and ASA.

## 2013-07-20 NOTE — Progress Notes (Signed)
Patient ID: Ryan [name] MRN: [id number], DOB: 05/17/1961, 53 y.o. Date of Encounter: 07/20/2013, 1:12 PM    Chief Complaint:  Chief Complaint  Patient presents with  . hosp f/u    pneumonia/heart prob  refill hydrocodone     HPI: 53 y.o. year old white male is here for followup of recent hospitalization. He went to [facility] on 07/09/13 and was diagnosed with pneumonia. He then went to [facility] on 07/10/13 secondary to palpitations and increased heart rate. There he was diagnosed with recurrent atrial fibrillation with RVR as well as community-acquired pneumonia. (He had a history of prior A. fib that had been in normal sinus rhythm at the last visit with cardiology.) He was admitted from 12/28 through 12/30. He was rate controlled with Cardizem. He converted to sinus rhythm spontaneously. He has followup appointment with cardiology this afternoon. In regards to the pneumonia, and during the hospitalization he was on Rocephin and Zithromax. He says that he forgot to bring in his medication bottles with him today. However he says that he took antibiotics at home twice a day for 4 days. Says that he has completed all of the antibiotics. Says that his cough is better and is back to his normal baseline.  I discussed his smoking with him. He says that prior to the illness he was smoking 1-1/2 packs per day. While he was sick he smokes none for 4-5 days. He is now started back smoking.     Home Meds: See attached medication section for any medications that were entered at today's visit. The computer does not put those onto this list.The following list is a list of meds entered prior to today's visit.   Current Outpatient Prescriptions on File Prior to Visit  Medication Sig Dispense Refill  . albuterol (VENTOLIN HFA) 108 (90 BASE) MCG/ACT inhaler Inhale 2 puffs into the lungs every 4 (four) hours as needed. Shortness of breath      . aspirin EC 81 MG tablet Take 81 mg by mouth daily.       [name] diltiazem (CARDIZEM CD) 240 MG 24 hr capsule Take 1 capsule (240 mg total) by mouth daily.  30 capsule  0  . Fluticasone-Salmeterol (ADVAIR DISKUS) 250-50 MCG/DOSE AEPB Inhale 1 puff into the lungs every 12 (twelve) hours.       . nitroGLYCERIN (NITROSTAT) 0.4 MG SL tablet Place 1 tablet (0.4 mg total) under the tongue every 5 (five) minutes as needed for chest pain.  25 tablet  4  . tiotropium (SPIRIVA) 18 MCG inhalation capsule Place 18 mcg into inhaler and inhale daily.      [name] HYDROcodone-acetaminophen (NORCO/VICODIN) 5-325 MG per tablet Take 1-2 tablets by mouth every 4 (four) hours as needed for moderate pain.  30 tablet  0  . nicotine (NICODERM CQ - DOSED IN MG/24 HR) 7 mg/24hr patch Place 1 patch (7 mg total) onto the skin daily.  28 patch  0   No current facility-administered medications on file prior to visit.    Allergies:  Allergies  Allergen Reactions  . Influenza Vaccine Live Swelling      Review of Systems: See HPI for pertinent ROS. All other ROS negative.    Physical Exam: Blood pressure 122/70, pulse 80, temperature 98.3 F (36.8 C), temperature source Oral, resp. rate 18, height 5' 5.5" (1.664 m), weight 144 lb (65.318 kg)., Body mass index is 23.59 kg/(m^2). General: WM . Smells of smoke. Appears in no acute  distress. Neck: Supple. No thyromegaly. No lymphadenopathy. Lungs: Clear bilaterally to auscultation without wheezes, rales, or rhonchi. Breathing is unlabored. He has slightly  distant breath sounds but they are clear. Heart: Regular rhythm. No murmurs, rubs, or gallops. Msk:  Strength and tone normal for age. Extremities/Skin: Warm and dry. No clubbing or cyanosis. No edema. No rashes or suspicious lesions. Neuro: Alert and oriented X 3. Moves all extremities spontaneously. Gait is normal. CNII-XII grossly in tact. Psych:  Responds to questions appropriately with a normal affect.     ASSESSMENT AND PLAN:  53 y.o. year old male with  1.s/p  CAP  (community acquired pneumonia) HAs completed antibiotics. This is resolved clinically.   2. COPD See #3 regarding smoking cessation.  3. Smoker I discussed need for smoking cessation. Discussed Chantix he says that he uses in the past but was ineffective. Discussed Wellbutrin. He says that he also uses in the past but was also ineffective. He says that something was set up while he was in the hospital and that someone has to be calling him tomorrow to provide support with smoking cessation.  4. S/p Atrial fibrillation with rapid ventricular response Cardiac exam reveals regular rhythm. He has followup appointment with cardiology this afternoon.    Signed, [address] [place], [place], BSFM 07/20/2013 1:12 PM

## 2013-07-20 NOTE — Patient Instructions (Addendum)
Your physician recommends that you schedule a follow-up appointment in: 6 months You will receive a reminder letter two months in advance reminding you to call and schedule your appointment. If you don't receive this letter, please contact our office.  Your physician recommends that you continue on your current medications as directed. Please refer to the Current Medication list given to you today.

## 2013-07-20 NOTE — Assessment & Plan Note (Signed)
He is without cardiac complaint today. Will continue him on risk modifcation with smoking cessation counseling. He is to work harder at quitting.

## 2013-07-20 NOTE — Progress Notes (Signed)
HPI: [name] is a 53 year old former patient of Dr. Lattie Haw were are following for  ongoing assessment and management of atrial fibrillation with history of COPD ongoing tobacco abuse and  diagnosis of cavitary lung mass. The patient is followed by Dr. Roxan Hockey after having had cervical evaluation secondary to the mass. He was thought to have stable and wall cavitary mass for the last 2 years. Advised not to smoke. He was last seen in the office in November of 2014 status post stress test which was negative for any areas of ischemia. Continued on aspirin risk modification.   Unfortunately the patient was admitted to [facility] in the setting of Atrial fib with RVR, and pneumonia. The patient converted to normal sinus rhythm spontaneously during hospitalization, and was thought to have etiology of acute illness. He was not placed on anticoagulation at that time.   He comes today without complaint of rapid HR or palpitations. Still recovering from pneumonia with occasional coughing. Has cut down smoking to 1/2 ppd.  Allergies  Allergen Reactions  . Influenza Vaccine Live Swelling    Current Outpatient Prescriptions  Medication Sig Dispense Refill  . albuterol (VENTOLIN HFA) 108 (90 BASE) MCG/ACT inhaler Inhale 2 puffs into the lungs every 4 (four) hours as needed. Shortness of breath      . aspirin EC 81 MG tablet Take 81 mg by mouth daily.      [name] diltiazem (CARDIZEM CD) 240 MG 24 hr capsule Take 1 capsule (240 mg total) by mouth daily.  30 capsule  0  . Fluticasone-Salmeterol (ADVAIR DISKUS) 250-50 MCG/DOSE AEPB Inhale 1 puff into the lungs every 12 (twelve) hours.       . nitroGLYCERIN (NITROSTAT) 0.4 MG SL tablet Place 1 tablet (0.4 mg total) under the tongue every 5 (five) minutes as needed for chest pain.  25 tablet  4  . tiotropium (SPIRIVA) 18 MCG inhalation capsule Place 18 mcg into inhaler and inhale daily.       No current facility-administered medications for this  visit.    Past Medical History  Diagnosis Date  . COPD (chronic obstructive pulmonary disease)   . Hypercholesteremia   . History of pneumonia   . Polycythemia   . Coronary atherosclerosis of native coronary artery     Mild nonobstructive disease at catheterization 2007  . 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 Borderline diabetes   . Seizures     Last seizure 2 yrs ago  . GERD (gastroesophageal reflux disease)   . Type 2 diabetes mellitus   . DDD (degenerative disc disease)     Cervical and thoracic  . Chronic back pain   . Bronchitis   . Chronic left shoulder pain   . Chronic neck pain   . Atrial fibrillation     Not anticoagulated  . Smoker     Past Surgical History  Procedure Laterality Date  . Colonoscopy w/ endoscopic [place]    . Vasectomy    . Throat biopsy    . Lung biopsy    . Vasectomy  1987    QPY:[id number] of systems complete and found to be negative unless listed above  PHYSICAL EXAM BP 114/70  Pulse 81  Ht 5\' 8"  (1.727 m)  Wt 142 lb (64.411 kg)  BMI 21.60 kg/m2  General: Well developed, well nourished, in no acute distress Head: Eyes PERRLA, No xanthomas.   Normal cephalic and atramatic  Lungs:  Clear bilaterally to auscultation and percussion with some upper airway wheezes. Heart: HRRR S1 S2, without MRG.  Pulses are 2+ & equal.            No carotid bruit. No JVD.  No abdominal bruits. No femoral bruits. Abdomen: Bowel sounds are positive, abdomen soft and non-tender without masses or                  Hernia's noted. Msk:  Back normal, normal gait. Normal strength and tone for age. Extremities: No clubbing, cyanosis or edema.  DP +1 Neuro: Alert and oriented X 3. Psych:  Good affect, responds appropriately    ASSESSMENT AND PLAN

## 2013-07-20 NOTE — Assessment & Plan Note (Signed)
Continues recovery but better each day.

## 2013-07-20 NOTE — Progress Notes (Deleted)
Name: Ryan [name]    DOB: 06-30-1961  Age: 53 y.o.  MR#: [id number]       PCP:  [name], PA-C      Insurance: Payor: MEDICARE / Plan: MEDICARE PART A AND B / Product Type: *No Product type* /   CC:    Chief Complaint  Patient presents with  . Coronary Artery Disease  . Atrial Fibrillation    VS Filed Vitals:   07/20/13 1358  BP: 114/70  Pulse: 81  Height: 5\' 8"  (1.727 m)  Weight: 142 lb (64.411 kg)    Weights Current Weight  07/20/13 142 lb (64.411 kg)  07/20/13 144 lb (65.318 kg)  07/11/13 149 lb 7.6 oz (67.8 kg)    Blood Pressure  BP Readings from Last 3 Encounters:  07/20/13 114/70  07/20/13 122/70  07/12/13 131/67     Admit date:  (Not on file) Last encounter with RMR:  05/24/2013   Allergy Influenza vaccine live  Current Outpatient Prescriptions  Medication Sig Dispense Refill  . albuterol (VENTOLIN HFA) 108 (90 BASE) MCG/ACT inhaler Inhale 2 puffs into the lungs every 4 (four) hours as needed. Shortness of breath      . aspirin EC 81 MG tablet Take 81 mg by mouth daily.      [name] diltiazem (CARDIZEM CD) 240 MG 24 hr capsule Take 1 capsule (240 mg total) by mouth daily.  30 capsule  0  . Fluticasone-Salmeterol (ADVAIR DISKUS) 250-50 MCG/DOSE AEPB Inhale 1 puff into the lungs every 12 (twelve) hours.       . nitroGLYCERIN (NITROSTAT) 0.4 MG SL tablet Place 1 tablet (0.4 mg total) under the tongue every 5 (five) minutes as needed for chest pain.  25 tablet  4  . tiotropium (SPIRIVA) 18 MCG inhalation capsule Place 18 mcg into inhaler and inhale daily.       No current facility-administered medications for this visit.    Discontinued Meds:    Medications Discontinued During This Encounter  Medication Reason  . HYDROcodone-acetaminophen (NORCO/VICODIN) 5-325 MG per tablet Error  . nicotine (NICODERM CQ - DOSED IN MG/24 HR) 7 mg/24hr patch Error    Patient Active Problem List   Diagnosis Date Noted  . Smoker   . Atrial fibrillation with rapid  ventricular response 07/10/2013  . CAP (community acquired pneumonia) 07/10/2013  . Shoulder pain, left 01/20/2012  . Neck pain on left side 01/20/2012  . Rotator cuff syndrome of left shoulder 01/20/2012  . Spondylosis, cervical 11/19/2011  . Shoulder pain 11/19/2011  . Acute respiratory failure with hypoxia 06/23/2011  . Pulmonary cavitary lesion 06/23/2011  . SOB (shortness of breath) 06/22/2011  . Nausea vomiting and diarrhea 06/22/2011  . Dehydration 06/22/2011  . Cough 06/22/2011  . Decompensated COPD with exacerbation (chronic obstructive pulmonary disease) 06/22/2011  . Abdominal pain 06/22/2011  . 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 Cavitary lesion of lung 05/07/2011  . GERD with stricture   . ASCVD (arteriosclerotic cardiovascular disease)   . Glucose intolerance (pre-diabetes)   . TOBACCO ABUSE 05/17/2010  . ASTHMA 01/20/2008  . HYPERLIPIDEMIA 12/27/2007  . COPD 12/27/2007    LABS    Component Value Date/Time   NA 136 07/11/2013 0457   NA 133* 07/10/2013 0153   NA 135 02/17/2013 2337   K 4.7 07/11/2013 0457   K 4.4 07/10/2013 0153   K 4.1 02/17/2013 2337   CL 103 07/11/2013 0457   CL 96 07/10/2013 0153   CL 99 02/17/2013  2337   CO2 24 07/11/2013 0457   CO2 21 07/10/2013 0153   CO2 27 02/17/2013 2337   GLUCOSE 116* 07/11/2013 0457   GLUCOSE 301* 07/10/2013 0153   GLUCOSE 96 02/17/2013 2337   BUN 13 07/11/2013 0457   BUN 12 07/10/2013 0153   BUN 14 02/17/2013 2337   CREATININE 1.05 07/11/2013 0457   CREATININE 1.08 07/10/2013 0153   CREATININE 1.00 02/17/2013 2337   CALCIUM 7.8* 07/11/2013 0457   CALCIUM 9.3 07/10/2013 0153   CALCIUM 9.0 02/17/2013 2337   GFRNONAA 79* 07/11/2013 0457   GFRNONAA 76* 07/10/2013 0153   GFRNONAA 83* 02/17/2013 2337   GFRAA >90 07/11/2013 0457   GFRAA 88* 07/10/2013 0153   GFRAA >90 02/17/2013 2337   CMP     Component Value Date/Time   NA 136 07/11/2013 0457   K 4.7 07/11/2013 0457   CL 103 07/11/2013 0457   CO2 24  07/11/2013 0457   GLUCOSE 116* 07/11/2013 0457   BUN 13 07/11/2013 0457   CREATININE 1.05 07/11/2013 0457   CALCIUM 7.8* 07/11/2013 0457   PROT 7.4 01/18/2013 1814   ALBUMIN 3.4* 01/18/2013 1814   AST 17 01/18/2013 1814   ALT 16 01/18/2013 1814   ALKPHOS 81 01/18/2013 1814   BILITOT 0.2* 01/18/2013 1814   GFRNONAA 79* 07/11/2013 0457   GFRAA >90 07/11/2013 0457       Component Value Date/Time   WBC 9.7 07/10/2013 0159   WBC 9.6 02/17/2013 2337   WBC 8.1 01/18/2013 1814   HGB 16.3 07/10/2013 0159   HGB 16.7 02/17/2013 2337   HGB 16.8 01/18/2013 1814   HCT 46.5 07/10/2013 0159   HCT 47.6 02/17/2013 2337   HCT 48.0 01/18/2013 1814   MCV 86.6 07/10/2013 0159   MCV 86.2 02/17/2013 2337   MCV 85.9 01/18/2013 1814    Lipid Panel     Component Value Date/Time   CHOL 131 06/23/2011 0555   TRIG 83 06/23/2011 0555   HDL 29* 06/23/2011 0555   CHOLHDL 4.5 06/23/2011 0555   VLDL 17 06/23/2011 0555   LDLCALC 85 06/23/2011 0555   LDLCALC 101 05/17/2010 0000    ABG    Component Value Date/Time   PHART 7.398 03/12/2011 1220   PCO2ART 36.8 03/12/2011 1220   PO2ART 64.9* 03/12/2011 1220   HCO3 22.2 03/12/2011 1220   TCO2 25 06/22/2011 0414   ACIDBASEDEF 1.9 03/12/2011 1220   O2SAT 92.4 03/12/2011 1220     Lab Results  Component Value Date   TSH 1.215 07/10/2013   BNP (last 3 results)  Recent Labs  07/10/13 0159  PROBNP 79.5   Cardiac Panel (last 3 results) No results found for this basename: CKTOTAL, CKMB, TROPONINI, RELINDX,  in the last 72 hours  Iron/TIBC/Ferritin No results found for this basename: iron, tibc, ferritin     EKG Orders placed during the hospital encounter of 07/10/13  . EKG 12-LEAD  . EKG 12-LEAD  . EKG     Prior Assessment and Plan Problem List as of 07/20/2013   HYPERLIPIDEMIA   Last Assessment & Plan   05/24/2013 Office Visit Written 05/24/2013  2:05 PM by Lendon Colonel, NP     He refuses labs at this time due to financial constraints. He is to find PCP as soon as  possible.,    TOBACCO ABUSE   Last Assessment & Plan   05/24/2013 Office Visit Written 05/24/2013  2:04 PM by Lendon Colonel, NP  Unfortunately continues to smoke. I have strongly advised him to quit as this is significant risk factor for worsening CAD. He verbalizes understanding    ASTHMA   COPD   GERD with stricture   ASCVD (arteriosclerotic cardiovascular disease)   Last Assessment & Plan   05/24/2013 Office Visit Edited 05/24/2013  2:06 PM by Lendon Colonel, NP     Stress test was negative for new area's of ischemia. He is without complaint. Will continue ASA and risk modification. He refuses labs at this time due to finances. He has seizures after stress test. States this occurred with last stress test. He will see [place] again in one year.     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 More recommendations once his biopsy has returned.    SOB (shortness of breath)   Nausea vomiting and diarrhea   Dehydration   Cough   Decompensated COPD with exacerbation (chronic obstructive pulmonary disease)   Abdominal pain   Acute respiratory failure with hypoxia   Pulmonary cavitary lesion   Spondylosis, cervical   Shoulder pain   Shoulder pain, left   Neck pain on left side   Rotator cuff syndrome of left shoulder   Atrial fibrillation with rapid ventricular response   CAP (community acquired pneumonia)   Smoker       Imaging: Dg Chest 2 View  07/09/2013   CLINICAL DATA:  One month history of cough and shortness of breath. Smoker with history of emphysema. Known cavitary left upper lobe lung mass and left hilar lymphadenopathy.  EXAM: CHEST  2 VIEW  COMPARISON:  Two-view chest x-ray 04/10/2013, 02/17/2013. CT chest 12/21/2012.  FINDINGS: Cardiac  silhouette normal in size, unchanged. Left hilar adenopathy, unchanged. Hilar and mediastinal contours otherwise unremarkable. Cavitary mass with air-fluid level in the lateral left upper lobe, unchanged. View streaky airspace opacities in the right lower lobe. No new abnormalities elsewhere in either lung. Severe bullous emphysematous changes. Visualized bony thorax intact.  IMPRESSION: 1. Acute right lower lobe pneumonia superimposed upon severe COPD/emphysema. 2. Stable cavitary mass in the left upper lobe and left hilar lymphadenopathy.   Electronically Signed   By: Evangeline Dakin M.D.   On: 07/09/2013 16:49   Ct Chest Wo Contrast  06/28/2013   CLINICAL DATA:  Cough and mild chest pain. History of prior cavitary lung lesion. Prior biopsy demonstrated mycobacterial infection.  EXAM: CT CHEST WITHOUT CONTRAST  TECHNIQUE: Multidetector CT imaging of the chest was performed following the standard protocol without IV contrast.  COMPARISON:  Chest CT 12/21/2012.  FINDINGS: Mediastinum: Heart size is normal. There is no significant pericardial fluid, thickening or pericardial calcification. There is atherosclerosis of the thoracic aorta, the great vessels of the mediastinum and the coronary arteries, including calcified atherosclerotic plaque in the left main, left anterior descending, left circumflex and right coronary arteries. No pathologically enlarged mediastinal or hilar lymph nodes. Please note that accurate exclusion of hilar adenopathy is limited on noncontrast CT scans. Small hiatal hernia.  Lungs/Pleura: As with numerous prior examinations there is a complex thick-walled cavitary lesion in the periphery of the left upper lobe. This appears very similar to the prior examinations, including several areas of mural nodularity. There is extensive surrounding architectural distortion and some adjacent  cylindrical bronchiectasis, as well as overlying pleural retraction. There is a background of severe  centrilobular and mild paraseptal emphysema. Areas of septal thickening are noted in the dependent portions of the lung bases bilaterally. Previously noted lingular nodule is similar to prior studies measuring 7 mm on today's examination (this is unchanged compared to prior study 09/07/2012). No acute consolidative airspace disease. No pleural effusions.  Upper Abdomen: Unremarkable.  Musculoskeletal: There are no aggressive appearing lytic or blastic lesions noted in the visualized portions of the skeleton.  IMPRESSION: 1. No significant change in thick-walled cavitary lesion in the left upper lobe compared in numerous prior examinations. 2. Unchanged lingular nodule. Although this nodule was previously reported as 5 mm on study 12/21/2012, the difference in size on today's examination is apparently related to slice selection, as this nodule measured 7 mm on more remote prior study 09/07/2012, and is considered unchanged on today's examination. 3. Atherosclerosis, including left main and 3 vessel coronary artery disease. Please note that although the presence of coronary artery calcium documents the presence of coronary artery disease, the severity of this disease and any potential stenosis cannot be assessed on this non-gated CT examination. Assessment for potential risk factor modification, dietary therapy or pharmacologic therapy may be warranted, if clinically indicated. 4. Severe emphysema redemonstrated.   Electronically Signed   By: Vinnie Langton M.D.   On: 06/28/2013 13:14   Dg Chest Port 1 View  07/10/2013   CLINICAL DATA:  Shortness of breath, midsternal chest pain and tachycardia.  EXAM: PORTABLE CHEST - 1 VIEW  COMPARISON:  Chest radiograph performed 07/09/2013  FINDINGS: New bibasilar airspace opacities are seen. This may reflect mild pneumonia or possibly minimal interstitial edema. No pleural effusion or pneumothorax is seen.  The cardiomediastinal silhouette is normal in size. No acute  osseous abnormalities are identified.  IMPRESSION: New bibasilar airspace opacities noted. This may reflect mild pneumonia or possibly minimal interstitial edema, though no ancillary findings are seen to suggest edema.   Electronically Signed   By: Garald Balding M.D.   On: 07/10/2013 02:24

## 2013-09-13 IMAGING — CT CT CHEST W/ CM
2 of 4 series · 14 of 36 positions shown, 17 images · IV contrast (Omnipaque 300)
Comparison: Chest CT on 02/17/2012

CT CHEST

CLINICAL DATA: Chest and abdominal pain and tenderness on exam.
Left lung mass.

CT CHEST, ABDOMEN AND PELVIS WITH CONTRAST
TECHNIQUE: Multidetector CT imaging of the chest, abdomen and
pelvis was performed following the standard protocol during bolus
administration of intravenous contrast.
Contrast: 100mL OMNIPAQUE IOHEXOL 300 MG/ML  SOLN

[Series 3: cap with 5.0 b40f · axial · 0.77mm/px · z∈[-492,+78]mm · 11 of 128 slices shown, 14 images]
[im 7/128  mediastinal]
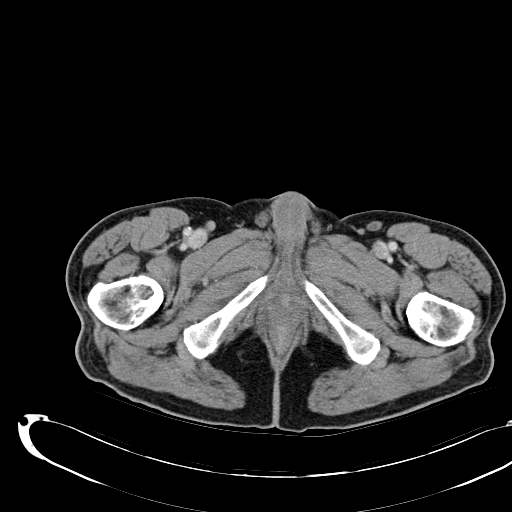
[im 7/128  lung]
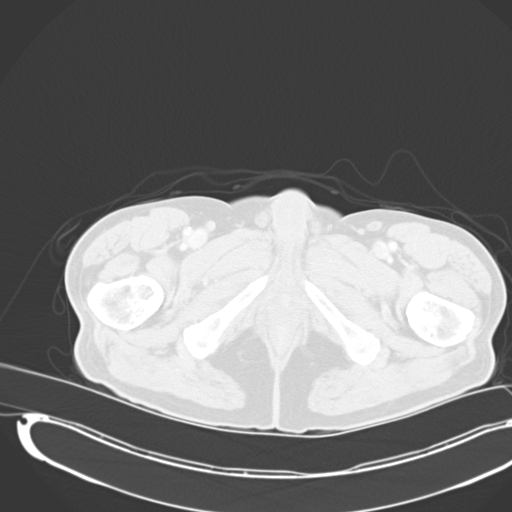
[im 20/128  lung]
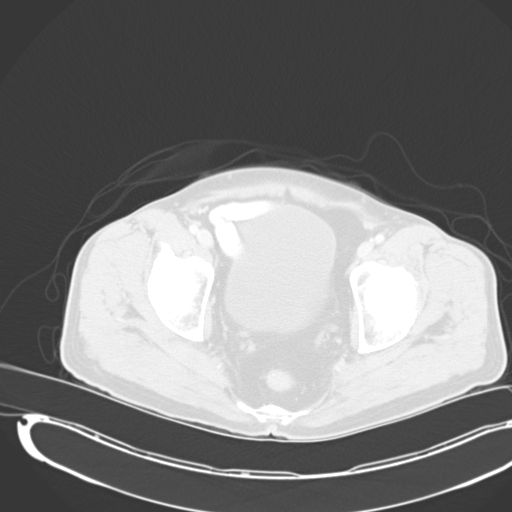
[im 32/128  lung]
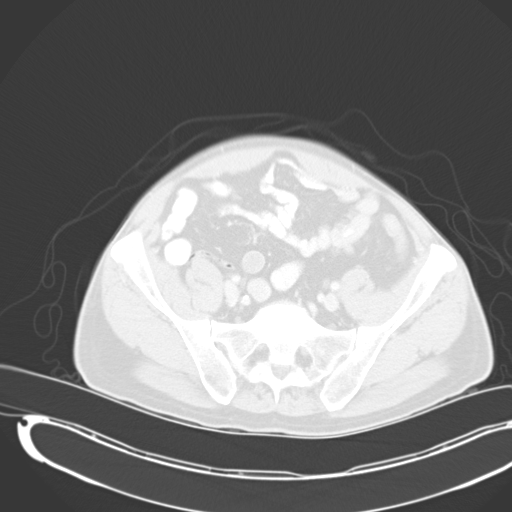
[im 45/128  lung]
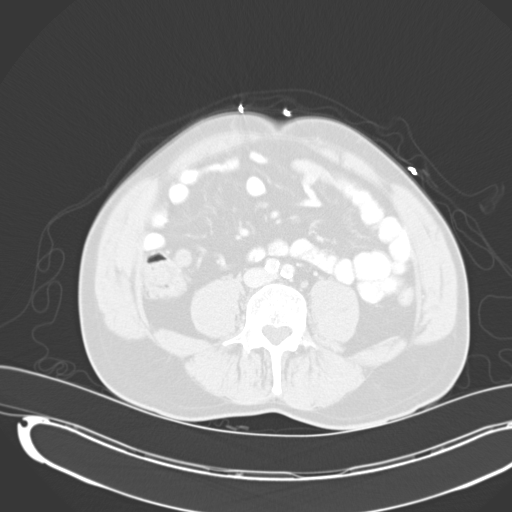
[im 51/128  mediastinal]
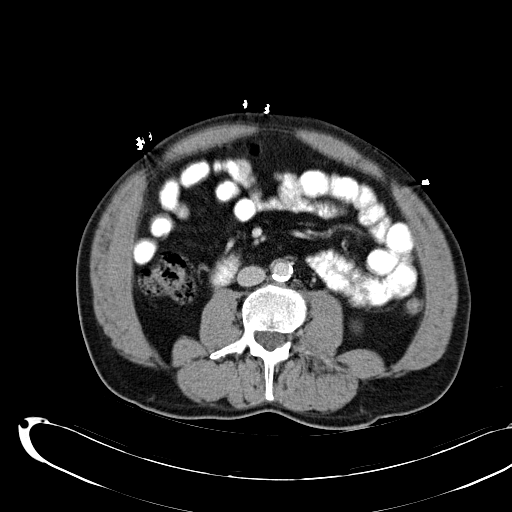
[im 51/128  lung]
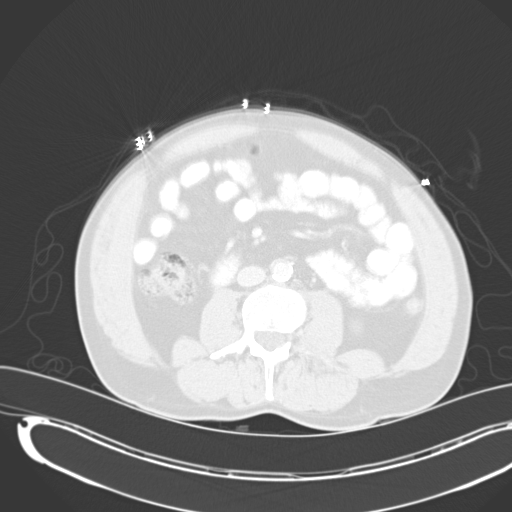
[im 64/128  lung]
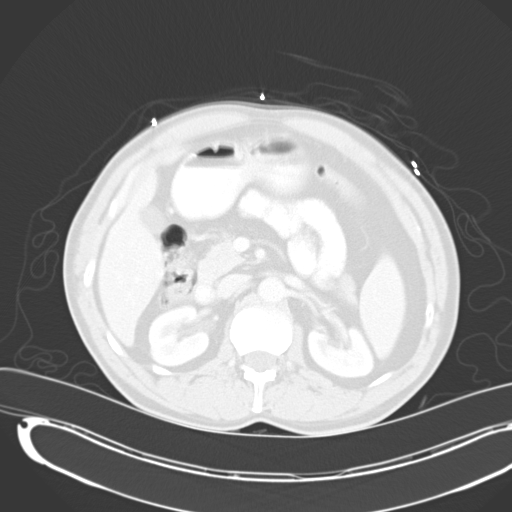
[im 77/128  lung]
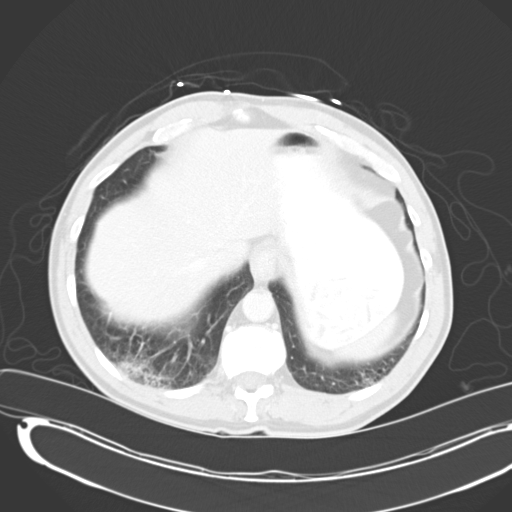
[im 83/128  lung]
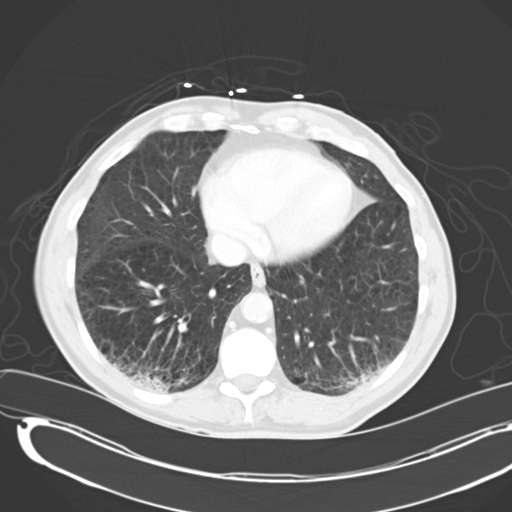
[im 96/128  mediastinal]
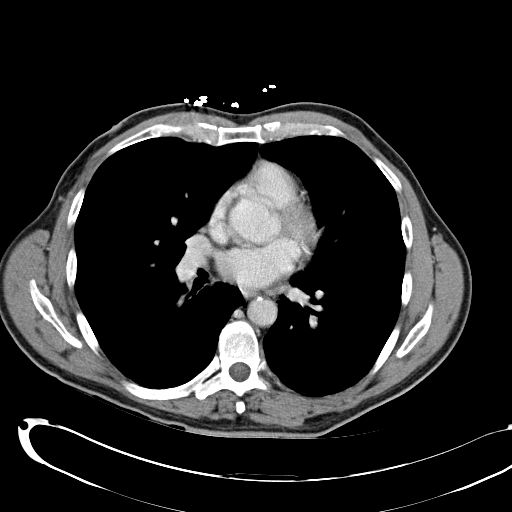
[im 96/128  lung]
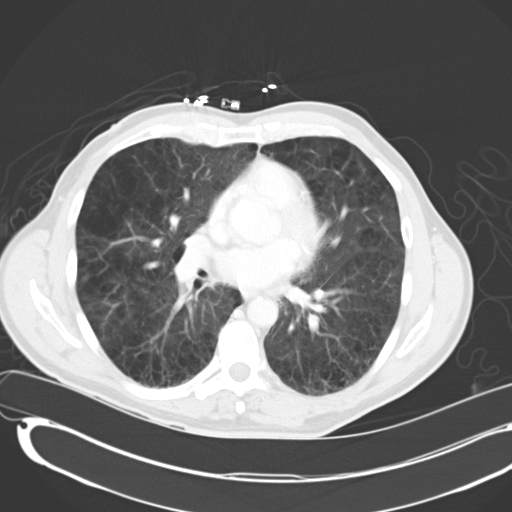
[im 108/128  lung]
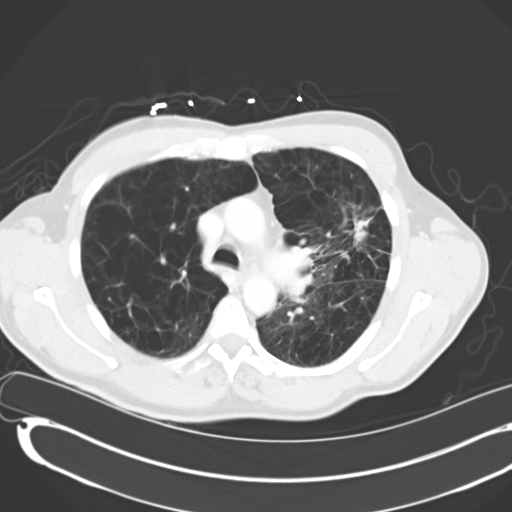
[im 121/128  lung]
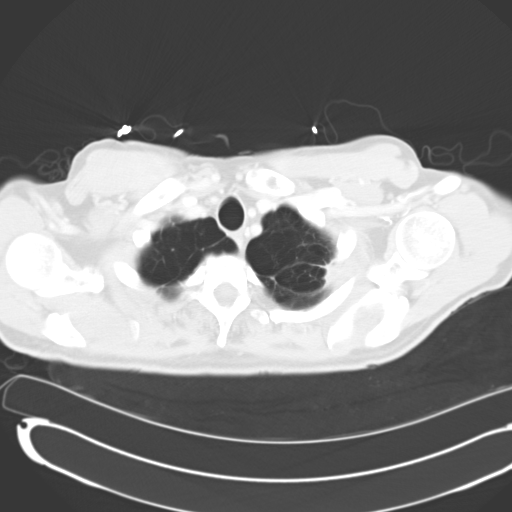

[Series 5: mpr cor post contrast (id) · coronal · 0.72mm/px · 3 of 89 slices shown]
[im 18/89  lung]
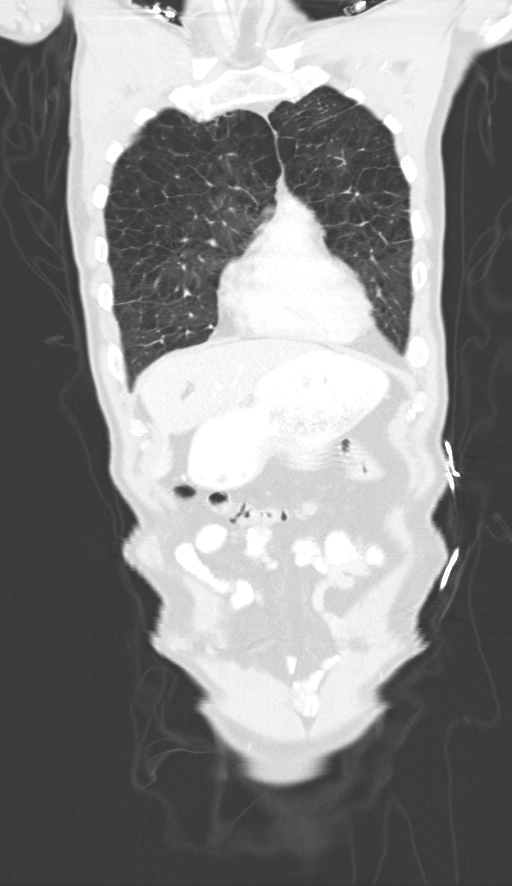
[im 36/89  lung]
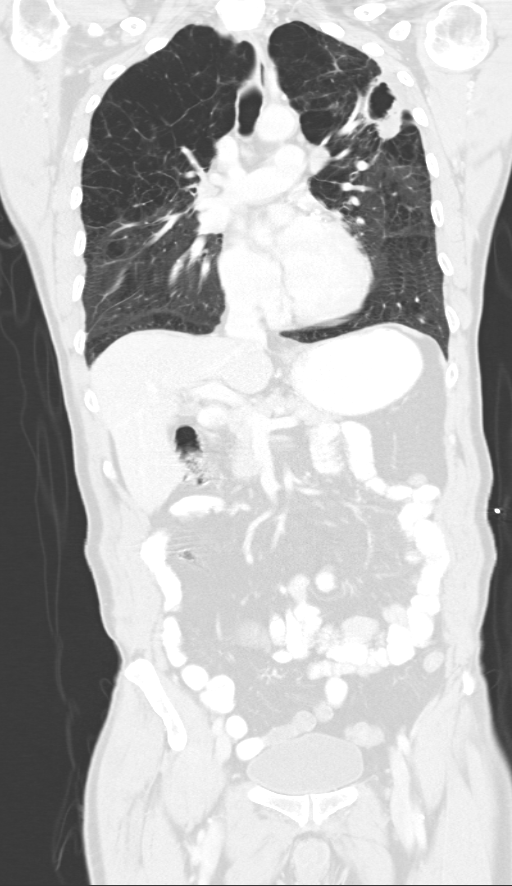
[im 53/89  lung]
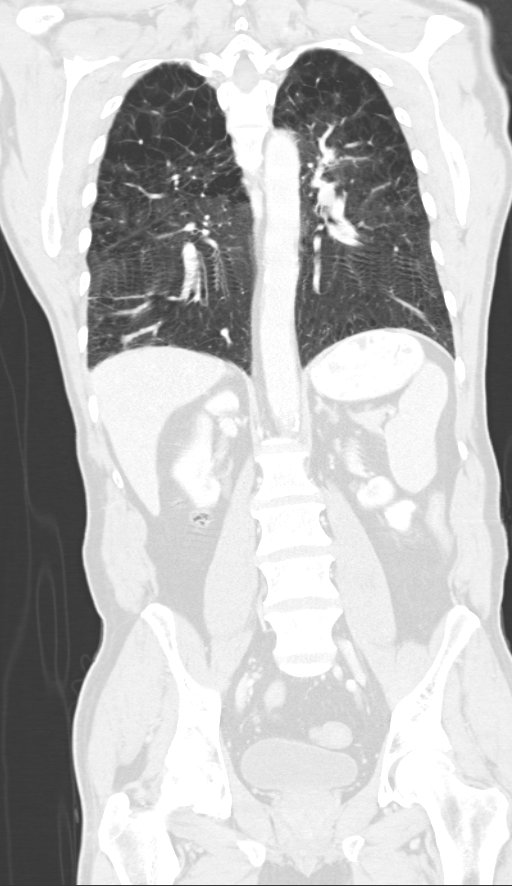

[14 of 36 positions shown; findings below may reference images not displayed]

FINDINGS: Severe emphysema again demonstrated. Thick-walled
cavitary lesion in the peripheral left upper lobe shows mild
decrease in size, now measuring 2.2 x 4.1 cm compared to 2.4 x
cm previously.  Bibasilar peripheral interstitial fibrosis with
honeycombing is unchanged.  No new areas of infiltrates seen and
there is no evidence of new or enlarging pulmonary nodules or
masses.

Mild bilateral hilar lymphadenopathy shows no significant change.
No evidence of mediastinal lymphadenopathy or axillary
lymphadenopathy.  No evidence of pleural or pericardial effusion.
IMPRESSION: 1.  Slight decrease in size of thick-walled cavitary lesion in the
peripheral left upper lobe.
2.  Severe pulmonary emphysema with bibasilar interstitial
fibrosis.
3.  Stable mild bilateral hilar lymphadenopathy.
4.  No new or progressive disease within the thorax.

CT ABDOMEN AND PELVIS
FINDINGS: The abdominal parenchymal organs are normal in
appearance.  Gallbladder is unremarkable.  No evidence of
hydronephrosis.  No soft tissue masses or lymphadenopathy
identified.

No evidence of inflammatory process or abnormal fluid collections.
Normal appendix is visualized.  No evidence of bowel wall
thickening or dilatation.
IMPRESSION: No evidence of metastatic disease or other significant abnormality.

## 2013-10-20 ENCOUNTER — Emergency Department (HOSPITAL_COMMUNITY): Payer: Medicare Other

## 2013-10-20 ENCOUNTER — Inpatient Hospital Stay (HOSPITAL_COMMUNITY)
Admission: EM | Admit: 2013-10-20 | Discharge: 2013-10-21 | DRG: 190 | Disposition: A | Discharge status: Home / Self Care | Payer: Medicare Other | Attending: Internal Medicine | Admitting: Internal Medicine

## 2013-10-20 ENCOUNTER — Encounter (HOSPITAL_COMMUNITY): Payer: Self-pay | Admitting: Emergency Medicine

## 2013-10-20 DIAGNOSIS — Z8701 Personal history of pneumonia (recurrent): Secondary | ICD-10-CM

## 2013-10-20 DIAGNOSIS — E43 Unspecified severe protein-calorie malnutrition: Secondary | ICD-10-CM | POA: Diagnosis present

## 2013-10-20 DIAGNOSIS — Z8249 Family history of ischemic heart disease and other diseases of the circulatory system: Secondary | ICD-10-CM | POA: Diagnosis not present

## 2013-10-20 DIAGNOSIS — J984 Other disorders of lung: Secondary | ICD-10-CM | POA: Diagnosis not present

## 2013-10-20 DIAGNOSIS — E119 Type 2 diabetes mellitus without complications: Secondary | ICD-10-CM | POA: Diagnosis present

## 2013-10-20 DIAGNOSIS — E785 Hyperlipidemia, unspecified: Secondary | ICD-10-CM

## 2013-10-20 DIAGNOSIS — I4891 Unspecified atrial fibrillation: Secondary | ICD-10-CM | POA: Diagnosis present

## 2013-10-20 DIAGNOSIS — Z7982 Long term (current) use of aspirin: Secondary | ICD-10-CM | POA: Diagnosis not present

## 2013-10-20 DIAGNOSIS — R05 Cough: Secondary | ICD-10-CM | POA: Diagnosis not present

## 2013-10-20 DIAGNOSIS — J438 Other emphysema: Secondary | ICD-10-CM | POA: Diagnosis not present

## 2013-10-20 DIAGNOSIS — Z833 Family history of diabetes mellitus: Secondary | ICD-10-CM

## 2013-10-20 DIAGNOSIS — J9601 Acute respiratory failure with hypoxia: Secondary | ICD-10-CM

## 2013-10-20 DIAGNOSIS — F17200 Nicotine dependence, unspecified, uncomplicated: Secondary | ICD-10-CM

## 2013-10-20 DIAGNOSIS — R7303 Prediabetes: Secondary | ICD-10-CM

## 2013-10-20 DIAGNOSIS — IMO0002 Reserved for concepts with insufficient information to code with codable children: Secondary | ICD-10-CM | POA: Diagnosis not present

## 2013-10-20 DIAGNOSIS — R197 Diarrhea, unspecified: Secondary | ICD-10-CM

## 2013-10-20 DIAGNOSIS — J449 Chronic obstructive pulmonary disease, unspecified: Secondary | ICD-10-CM

## 2013-10-20 DIAGNOSIS — M542 Cervicalgia: Secondary | ICD-10-CM

## 2013-10-20 DIAGNOSIS — D751 Secondary polycythemia: Secondary | ICD-10-CM

## 2013-10-20 DIAGNOSIS — R059 Cough, unspecified: Secondary | ICD-10-CM | POA: Diagnosis not present

## 2013-10-20 DIAGNOSIS — K219 Gastro-esophageal reflux disease without esophagitis: Secondary | ICD-10-CM | POA: Diagnosis present

## 2013-10-20 DIAGNOSIS — J45909 Unspecified asthma, uncomplicated: Secondary | ICD-10-CM

## 2013-10-20 DIAGNOSIS — E7800 Pure hypercholesterolemia, unspecified: Secondary | ICD-10-CM | POA: Diagnosis present

## 2013-10-20 DIAGNOSIS — R042 Hemoptysis: Secondary | ICD-10-CM | POA: Diagnosis present

## 2013-10-20 DIAGNOSIS — R071 Chest pain on breathing: Secondary | ICD-10-CM | POA: Diagnosis not present

## 2013-10-20 DIAGNOSIS — G8929 Other chronic pain: Secondary | ICD-10-CM | POA: Diagnosis present

## 2013-10-20 DIAGNOSIS — R079 Chest pain, unspecified: Secondary | ICD-10-CM | POA: Diagnosis not present

## 2013-10-20 DIAGNOSIS — J441 Chronic obstructive pulmonary disease with (acute) exacerbation: Secondary | ICD-10-CM | POA: Diagnosis not present

## 2013-10-20 DIAGNOSIS — I2510 Atherosclerotic heart disease of native coronary artery without angina pectoris: Secondary | ICD-10-CM | POA: Diagnosis present

## 2013-10-20 DIAGNOSIS — K222 Esophageal obstruction: Secondary | ICD-10-CM

## 2013-10-20 DIAGNOSIS — R0789 Other chest pain: Secondary | ICD-10-CM

## 2013-10-20 DIAGNOSIS — R0602 Shortness of breath: Secondary | ICD-10-CM | POA: Diagnosis not present

## 2013-10-20 DIAGNOSIS — J189 Pneumonia, unspecified organism: Secondary | ICD-10-CM

## 2013-10-20 DIAGNOSIS — E860 Dehydration: Secondary | ICD-10-CM

## 2013-10-20 DIAGNOSIS — M25519 Pain in unspecified shoulder: Secondary | ICD-10-CM

## 2013-10-20 DIAGNOSIS — J8410 Pulmonary fibrosis, unspecified: Secondary | ICD-10-CM | POA: Diagnosis not present

## 2013-10-20 DIAGNOSIS — M25512 Pain in left shoulder: Secondary | ICD-10-CM

## 2013-10-20 DIAGNOSIS — R112 Nausea with vomiting, unspecified: Secondary | ICD-10-CM

## 2013-10-20 DIAGNOSIS — Z9981 Dependence on supplemental oxygen: Secondary | ICD-10-CM | POA: Diagnosis not present

## 2013-10-20 DIAGNOSIS — M75102 Unspecified rotator cuff tear or rupture of left shoulder, not specified as traumatic: Secondary | ICD-10-CM

## 2013-10-20 DIAGNOSIS — M47812 Spondylosis without myelopathy or radiculopathy, cervical region: Secondary | ICD-10-CM

## 2013-10-20 LAB — CBC WITH DIFFERENTIAL/PLATELET
BASOS ABS: 0 10*3/uL (ref 0.0–0.1)
BASOS PCT: 0 % (ref 0–1)
EOS PCT: 1 % (ref 0–5)
Eosinophils Absolute: 0.1 10*3/uL (ref 0.0–0.7)
HEMATOCRIT: 45 % (ref 39.0–52.0)
HEMOGLOBIN: 15.1 g/dL (ref 13.0–17.0)
Lymphocytes Relative: 33 % (ref 12–46)
Lymphs Abs: 1.7 10*3/uL (ref 0.7–4.0)
MCH: 29 pg (ref 26.0–34.0)
MCHC: 33.6 g/dL (ref 30.0–36.0)
MCV: 86.5 fL (ref 78.0–100.0)
MONO ABS: 0.5 10*3/uL (ref 0.1–1.0)
MONOS PCT: 10 % (ref 3–12)
Neutro Abs: 2.9 10*3/uL (ref 1.7–7.7)
Neutrophils Relative %: 56 % (ref 43–77)
Platelets: 208 10*3/uL (ref 150–400)
RBC: 5.2 MIL/uL (ref 4.22–5.81)
RDW: 12.8 % (ref 11.5–15.5)
WBC: 5.2 10*3/uL (ref 4.0–10.5)

## 2013-10-20 LAB — COMPREHENSIVE METABOLIC PANEL
ALT: 17 U/L (ref 0–53)
AST: 15 U/L (ref 0–37)
Albumin: 3.2 g/dL — ABNORMAL LOW (ref 3.5–5.2)
Alkaline Phosphatase: 73 U/L (ref 39–117)
BUN: 10 mg/dL (ref 6–23)
CALCIUM: 9.1 mg/dL (ref 8.4–10.5)
CO2: 28 mEq/L (ref 19–32)
Chloride: 100 mEq/L (ref 96–112)
Creatinine, Ser: 0.99 mg/dL (ref 0.50–1.35)
GFR calc Af Amer: >90 mL/min (ref >90)
GFR calc non Af Amer: >90 mL/min (ref >90)
Glucose, Bld: 113 mg/dL — ABNORMAL HIGH (ref 70–99)
Potassium: 4.2 mEq/L (ref 3.7–5.3)
Sodium: 138 mEq/L (ref 137–147)
Total Bilirubin: 0.4 mg/dL (ref 0.3–1.2)
Total Protein: 7.5 g/dL (ref 6.0–8.3)

## 2013-10-20 LAB — RAPID URINE DRUG SCREEN, HOSP PERFORMED
AMPHETAMINES: NOT DETECTED
Barbiturates: NOT DETECTED
Benzodiazepines: NOT DETECTED
Cocaine: NOT DETECTED
OPIATES: NOT DETECTED
Tetrahydrocannabinol: POSITIVE — AB

## 2013-10-20 LAB — URINALYSIS, ROUTINE W REFLEX MICROSCOPIC
Bilirubin Urine: NEGATIVE
GLUCOSE, UA: NEGATIVE mg/dL
Hgb urine dipstick: NEGATIVE
KETONES UR: NEGATIVE mg/dL
LEUKOCYTES UA: NEGATIVE
Nitrite: NEGATIVE
Protein, ur: NEGATIVE mg/dL
Specific Gravity, Urine: 1.015 (ref 1.005–1.030)
UROBILINOGEN UA: 0.2 mg/dL (ref 0.0–1.0)
pH: 6 (ref 5.0–8.0)

## 2013-10-20 LAB — TROPONIN I: Troponin I: <0.3 ng/mL (ref <0.30)

## 2013-10-20 MED ORDER — IPRATROPIUM-ALBUTEROL 0.5-2.5 (3) MG/3ML IN SOLN
3.0000 mL | RESPIRATORY_TRACT | Status: DC
  Administered 2013-10-20 – 2013-10-21 (×5): 3 mL via RESPIRATORY_TRACT
  Filled 2013-10-20 (×5): qty 3

## 2013-10-20 MED ORDER — IPRATROPIUM-ALBUTEROL 0.5-2.5 (3) MG/3ML IN SOLN: 3.0000 mL | Freq: Once | RESPIRATORY_TRACT | Status: DC

## 2013-10-20 MED ORDER — KETOROLAC TROMETHAMINE 30 MG/ML IJ SOLN
30.0000 mg | Freq: Once | INTRAMUSCULAR | Status: AC
  Administered 2013-10-20: 30 mg via INTRAVENOUS
  Filled 2013-10-20: qty 1

## 2013-10-20 MED ORDER — CYCLOBENZAPRINE HCL 10 MG PO TABS
10.0000 mg | ORAL_TABLET | Freq: Three times a day (TID) | ORAL | Status: DC | PRN
  Administered 2013-10-21: 10 mg via ORAL
  Filled 2013-10-20: qty 1

## 2013-10-20 MED ORDER — DEXTROSE 5 % IV SOLN
1.0000 g | INTRAVENOUS | Status: DC
  Administered 2013-10-20: 1 g via INTRAVENOUS
  Filled 2013-10-20 (×4): qty 10

## 2013-10-20 MED ORDER — CLINDAMYCIN PHOSPHATE 600 MG/50ML IV SOLN
600.0000 mg | Freq: Four times a day (QID) | INTRAVENOUS | Status: DC
  Administered 2013-10-20 – 2013-10-21 (×3): 600 mg via INTRAVENOUS
  Filled 2013-10-20 (×15): qty 50

## 2013-10-20 MED ORDER — ALBUTEROL SULFATE (2.5 MG/3ML) 0.083% IN NEBU
5.0000 mg | INHALATION_SOLUTION | Freq: Once | RESPIRATORY_TRACT | Status: AC
  Administered 2013-10-20: 5 mg via RESPIRATORY_TRACT
  Filled 2013-10-20: qty 6

## 2013-10-20 MED ORDER — BUDESONIDE-FORMOTEROL FUMARATE 80-4.5 MCG/ACT IN AERO
2.0000 | INHALATION_SPRAY | Freq: Two times a day (BID) | RESPIRATORY_TRACT | Status: DC
  Administered 2013-10-21: 2 via RESPIRATORY_TRACT
  Filled 2013-10-20: qty 6.9

## 2013-10-20 MED ORDER — METHYLPREDNISOLONE SODIUM SUCC 125 MG IJ SOLR
60.0000 mg | Freq: Four times a day (QID) | INTRAMUSCULAR | Status: DC
  Administered 2013-10-20 – 2013-10-21 (×3): 60 mg via INTRAVENOUS
  Filled 2013-10-20 (×3): qty 2

## 2013-10-20 MED ORDER — ALBUTEROL (5 MG/ML) CONTINUOUS INHALATION SOLN
15.0000 mg/h | INHALATION_SOLUTION | Freq: Once | RESPIRATORY_TRACT | Status: AC
  Administered 2013-10-20: 15 mg/h via RESPIRATORY_TRACT
  Filled 2013-10-20: qty 20

## 2013-10-20 MED ORDER — ADULT MULTIVITAMIN W/MINERALS CH
1.0000 | ORAL_TABLET | Freq: Every day | ORAL | Status: DC
  Administered 2013-10-20 – 2013-10-21 (×2): 1 via ORAL
  Filled 2013-10-20 (×2): qty 1

## 2013-10-20 MED ORDER — IPRATROPIUM BROMIDE 0.02 % IN SOLN
0.5000 mg | Freq: Once | RESPIRATORY_TRACT | Status: AC
  Administered 2013-10-20: 0.5 mg via RESPIRATORY_TRACT
  Filled 2013-10-20: qty 2.5

## 2013-10-20 MED ORDER — BUDESONIDE-FORMOTEROL FUMARATE 80-4.5 MCG/ACT IN AERO
INHALATION_SPRAY | RESPIRATORY_TRACT | Status: AC
  Filled 2013-10-20: qty 6.9

## 2013-10-20 MED ORDER — VITAMIN B-1 100 MG PO TABS
100.0000 mg | ORAL_TABLET | Freq: Every day | ORAL | Status: DC
  Administered 2013-10-20 – 2013-10-21 (×2): 100 mg via ORAL
  Filled 2013-10-20 (×2): qty 1

## 2013-10-20 MED ORDER — ASPIRIN EC 81 MG PO TBEC
81.0000 mg | DELAYED_RELEASE_TABLET | Freq: Every day | ORAL | Status: DC
  Administered 2013-10-20 – 2013-10-21 (×2): 81 mg via ORAL
  Filled 2013-10-20 (×2): qty 1

## 2013-10-20 MED ORDER — CYCLOBENZAPRINE HCL 10 MG PO TABS
5.0000 mg | ORAL_TABLET | Freq: Once | ORAL | Status: AC
  Administered 2013-10-20: 5 mg via ORAL
  Filled 2013-10-20: qty 1

## 2013-10-20 MED ORDER — FOLIC ACID 1 MG PO TABS
1.0000 mg | ORAL_TABLET | Freq: Every day | ORAL | Status: DC
  Administered 2013-10-20 – 2013-10-21 (×2): 1 mg via ORAL
  Filled 2013-10-20 (×2): qty 1

## 2013-10-20 MED ORDER — FLUTICASONE PROPIONATE 50 MCG/ACT NA SUSP
2.0000 | Freq: Every day | NASAL | Status: DC
  Administered 2013-10-21: 2 via NASAL
  Filled 2013-10-20: qty 16

## 2013-10-20 MED ORDER — NITROGLYCERIN 0.4 MG SL SUBL
0.4000 mg | SUBLINGUAL_TABLET | SUBLINGUAL | Status: DC | PRN
Start: 2013-10-20 — End: 2013-10-21
  Filled 2013-10-20: qty 1

## 2013-10-20 MED ORDER — ALBUTEROL SULFATE (2.5 MG/3ML) 0.083% IN NEBU: 2.5000 mg | INHALATION_SOLUTION | Freq: Once | RESPIRATORY_TRACT | Status: DC

## 2013-10-20 MED ORDER — KETOROLAC TROMETHAMINE 30 MG/ML IJ SOLN
30.0000 mg | Freq: Four times a day (QID) | INTRAMUSCULAR | Status: DC | PRN
  Administered 2013-10-20 – 2013-10-21 (×2): 30 mg via INTRAVENOUS
  Filled 2013-10-20 (×3): qty 1

## 2013-10-20 MED ORDER — MOMETASONE FURO-FORMOTEROL FUM 100-5 MCG/ACT IN AERO: 2.0000 | INHALATION_SPRAY | Freq: Two times a day (BID) | RESPIRATORY_TRACT | Status: DC

## 2013-10-20 MED ORDER — DEXTROSE 5 % IV SOLN
INTRAVENOUS | Status: AC
  Filled 2013-10-20: qty 10

## 2013-10-20 MED ORDER — IOHEXOL 350 MG/ML SOLN
100.0000 mL | Freq: Once | INTRAVENOUS | Status: AC | PRN
  Administered 2013-10-20: 100 mL via INTRAVENOUS

## 2013-10-20 MED ORDER — DEXTROSE-NACL 5-0.45 % IV SOLN
INTRAVENOUS | Status: DC
  Administered 2013-10-20: 20:00:00 via INTRAVENOUS

## 2013-10-20 MED ORDER — CLINDAMYCIN PHOSPHATE 600 MG/50ML IV SOLN
INTRAVENOUS | Status: AC
  Filled 2013-10-20: qty 150

## 2013-10-20 NOTE — H&P (Signed)
[facility] History and Physical  Ryan [name] IOE:[id number] DOB: 24-Nov-1960 DOA: 10/20/2013  Referring physician: Rolland Porter, MD PCP: Ryan Juba, PA-C   Chief Complaint: Coughing up blood  HPI: Ryan [name] is a 53 y.o. male presents with coughing up blood. He states that this has been going on for several weeks now. It had improved but again started and so he decided to come in for evaluation. He states that in the past he has had a hole in the lung and was seen in [place] for this. Patient states that he was treated and apparently it had improved but is not sure. He is a smoker and has been for many years. He states he hs no weight loss and has no night sweats. He has had some pain in the chest also mainly on the left side. In addtion he tates that he has had a diagnosis of COPD in the past. He states that he has been on oxygen at home and also does use inhalers fairly regularly. No syncope no nausea and vomiting noted. He has had SOB noted and also admits to having wheeze noted. There is a prior history of pneumonia.   Review of Systems:  Constitutional:  No weight loss, night sweats, Fevers, chills, fatigue.  HEENT:  No headaches, Difficulty swallowing,Tooth/dental problems,Sore throat,  No sneezing, itching, ear ache, nasal congestion, post nasal drip,  Cardio-vascular:  ++ chest pain, no Orthopnea, PND, swelling in lower extremities, anasarca, dizziness, palpitations  GI:  No heartburn, indigestion, abdominal pain, nausea, vomiting, diarrhea, change in bowel habits, loss of appetite  Resp:  ++shortness of breath with exertion and at rest. No excess mucus, ++coughing up of blood.++wheeze Skin:  no rash or lesions.  GU:  no dysuria, change in color of urine, no urgency or frequency. No flank pain.  Musculoskeletal:  No joint pain or swelling. No decreased range of motion. No back pain.  Psych:  No change in mood or affect. No depression or anxiety. No memory loss.     Past Medical History  Diagnosis Date  . COPD (chronic obstructive pulmonary disease)   . Hypercholesteremia   . History of pneumonia   . Polycythemia   . Coronary atherosclerosis of native coronary artery     Mild nonobstructive disease at catheterization 2007  . 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 Borderline diabetes   . Seizures     Last seizure 2 yrs ago  . GERD (gastroesophageal reflux disease)   . Type 2 diabetes mellitus   . DDD (degenerative disc disease)     Cervical and thoracic  . Chronic back pain   . Bronchitis   . Chronic left shoulder pain   . Chronic neck pain   . Atrial fibrillation     Not anticoagulated  . Smoker    Past Surgical History  Procedure Laterality Date  . Colonoscopy w/ endoscopic [place]    . Vasectomy    . Throat biopsy    . Lung biopsy    . Vasectomy  1987   Social History:  reports that he has been smoking Cigarettes.  He has a 22.5 pack-year smoking history. He has quit using smokeless tobacco. He reports that he uses illicit drugs (Marijuana). He reports that he does not drink alcohol.  Allergies  Allergen Reactions  . Influenza Vaccine Live Swelling    Family History  Problem Relation Age of Onset  . Hypertension Mother   .  Diabetes Mother   . Heart attack Mother   . Heart failure Mother   . Hypertension Sister   . Diabetes Sister   . Heart failure Sister      Prior to Admission medications   Medication Sig Start Date End Date Taking? Authorizing Provider  albuterol (VENTOLIN HFA) 108 (90 BASE) MCG/ACT inhaler Inhale 2 puffs into the lungs every 4 (four) hours as needed. Shortness of breath   Yes Historical Provider, MD  aspirin EC 81 MG tablet Take 81 mg by mouth daily.   Yes Historical Provider, MD  fluticasone (FLONASE) 50 MCG/ACT nasal spray Place 2 sprays into both nostrils daily.   Yes Historical Provider, MD  Fluticasone-Salmeterol (ADVAIR DISKUS) 250-50 MCG/DOSE AEPB Inhale 1  puff into the lungs every 12 (twelve) hours.    Yes Historical Provider, MD  tiotropium (SPIRIVA) 18 MCG inhalation capsule Place 18 mcg into inhaler and inhale daily.   Yes Historical Provider, MD  nitroGLYCERIN (NITROSTAT) 0.4 MG SL tablet Place 1 tablet (0.4 mg total) under the tongue every 5 (five) minutes as needed for chest pain. 04/22/13   Ryan Colonel, NP   Physical Exam: Filed Vitals:   10/20/13 1306  BP: 110/72  Pulse: 79  Resp: 22    BP 110/72  Pulse 79  Resp 22  SpO2 97%  General:  Appears calm and comfortable Eyes: PERRL, normal lids, irises & conjunctiva ENT: grossly normal hearing, lips & tongue Neck: no LAD, masses or thyromegaly Cardiovascular: RRR, no m/r/g. No LE edema. Telemetry: SR, no arrhythmias  Respiratory: diminished bilaterally some wheeze is noted diffusely Abdomen: soft, ntnd Skin: no rash or induration seen on limited exam Musculoskeletal: grossly normal tone BUE/BLE Psychiatric: grossly normal mood and affect, speech fluent and appropriate Neurologic: grossly non-focal.          Labs on Admission:  Basic Metabolic Panel:  Recent Labs Lab 10/20/13 1131  NA 138  K 4.2  CL 100  CO2 28  GLUCOSE 113*  BUN 10  CREATININE 0.99  CALCIUM 9.1   Liver Function Tests:  Recent Labs Lab 10/20/13 1131  AST 15  ALT 17  ALKPHOS 73  BILITOT 0.4  PROT 7.5  ALBUMIN 3.2*   No results found for this basename: LIPASE, AMYLASE,  in the last 168 hours No results found for this basename: AMMONIA,  in the last 168 hours CBC:  Recent Labs Lab 10/20/13 1131  WBC 5.2  NEUTROABS 2.9  HGB 15.1  HCT 45.0  MCV 86.5  PLT 208   Cardiac Enzymes:  Recent Labs Lab 10/20/13 1131  TROPONINI <0.30    BNP (last 3 results)  Recent Labs  07/10/13 0159  PROBNP 79.5   CBG: No results found for this basename: GLUCAP,  in the last 168 hours  Radiological Exams on Admission: Dg Chest 2 View  10/20/2013   CLINICAL DATA:  Cough, chest pain,  shortness of Breath  EXAM: CHEST  2 VIEW  COMPARISON:  07/13/2013  FINDINGS: Cardiomediastinal silhouette is stable. Mild hyperinflation. Stable cavitary lesion in left upper lobe measures 3 cm. There might be a second cavitary lesion in left upper lobe measures 1.9 cm. No acute infiltrate or pulmonary edema. Stable hyperinflation and chronic mild interstitial prominence  IMPRESSION: Mild hyperinflation. Stable cavitary lesion in left upper lobe measures 3 cm. There might be a second cavitary lesion in left upper lobe measures 1.9 cm. No acute infiltrate or pulmonary edema. Stable hyperinflation and chronic mild interstitial prominence  Electronically Signed   By: Lahoma Crocker M.D.   On: 10/20/2013 10:57   Ct Angio Chest W/cm &/or Wo Cm  10/20/2013   CLINICAL DATA:  Shortness of Breath  EXAM: CT ANGIOGRAPHY CHEST WITH CONTRAST  TECHNIQUE: Multidetector CT imaging of the chest was performed using the standard protocol during bolus administration of intravenous contrast. Multiplanar CT image reconstructions and MIPs were obtained to evaluate the vascular anatomy.  CONTRAST:  147mL OMNIPAQUE IOHEXOL 350 MG/ML SOLN  COMPARISON:  Chest CT June 28, 2013; chest radiograph October 20, 2013  FINDINGS: There is no demonstrable pulmonary embolus. There is no thoracic aortic aneurysm or dissection.  Extensive underlying emphysematous change is stable. The previously noted thick-walled cavitary lesion in the posterior segment of the left upper lobe is again noted. Currently, it measures 4.6 x 2.6 cm, unchanged from prior study. Nodularity within this cavitary lesion also remains stable in appearance. There is a stable 7 mm nodular opacity in the posterior lingula as well. This small lesion is not cavitated.  There is stable interstitial fibrosis in both lung bases. There is no new fibrosis. There is again noted cicatrization with bronchiectasis in the left upper lobe which is stable.  There is a stable mildly prominent lymph  node in the superior left hilum measuring 1.7 x 1.4 cm. In the mid left hilum, there is a stable lymph node measuring 1.5 x 1.0 cm. In the sub- carinal region, there is a mildly prominent lymph node measuring 1.9 x 1.1 cm, stable. In the right hilum, there is a mildly prominent lymph node measuring 1.6 x 1.2 cm. There is no new lymph node prominence on this study.  Pericardium is not thickened. There are foci of coronary artery calcification, noted previously.  In the visualized upper abdomen, there is fatty change in the liver. Visualized upper abdominal structures otherwise appear normal. There are no blastic or lytic bone lesions. Thyroid appears normal.  Review of the MIP images confirms the above findings.  IMPRESSION: No demonstrable pulmonary embolus. Extensive emphysematous change remains with interstitial fibrosis in the lung bases, stable. The cavitary lesion remains in the left upper lobe without appreciable change as does a 7 mm nodular opacity in the posterior inferior lingula. There is no new lung opacity. Mild adenopathy remains without change. No new lesion identified.   Electronically Signed   By: Lowella Grip M.D.   On: 10/20/2013 14:43    EKG: Independently reviewed.No acute changes  Assessment/Plan Principal Problem:   Hemoptysis Active Problems:   TOBACCO ABUSE   COPD   Cough   Decompensated COPD with exacerbation (chronic obstructive pulmonary disease)   Pulmonary cavitary lesion   1. Hemoptysis -likely related to cavitary lesion ?etiology -will admit for abx rocephin and clindamycin ordered due to possible abscess -will get pulmonary consult for possible bronch  2. Cavitary Lung lesion -as above started on antibiotics -AFB will be collected -will collect sputum C&S -also ordered aspergillus titers (less likely)  3. COPD -started on steroids due to diffuse wheeze -will start duoneb nebulizer -continue with dulera  4. Tobacco Use -counsled on smoking  cesssation    Code Status: Full Code (must indicate code status--if unknown or must be presumed, indicate so) Family Communication: Wife in room (indicate person spoken with, if applicable, with phone number if by telephone) Disposition Plan: Home (indicate anticipated LOS)  Time spent: 106min  Carlita Whitcomb A [place]ah Bejar [facility] Pager [phone]

## 2013-10-20 NOTE — ED Notes (Signed)
Patient's o2 sat was 90-94% while ambulating.  Patient stated that "doctor had already told him his oxygen fell while walking".  Patient was scheduled for stress test and it could not be done due to his o2 sat levels.

## 2013-10-20 NOTE — ED Notes (Signed)
Pt reports has history of copd.  Reports for the past 2 weeks has been having pain in r chest and coughing up blood tinged sputum.  Reports intermittent fever.

## 2013-10-20 NOTE — ED Provider Notes (Signed)
CSN: [id number]     Arrival date & time 10/20/13  1016 History  This chart was scribed for Janice Norrie, MD by Delphia Grates, ED Scribe. This patient was seen in room APA07/APA07 and the patient's care was started at 10:55 AM.    Chief Complaint  Patient presents with  . Shortness of Breath    The history is provided by the patient. No language interpreter was used.    HPI Comments: Ryan [name] is a 53 y.o. male with h/o COPD, pneumonia, and cavitary lesion of lung who presents to the Emergency Department complaining of a few weeks of persistent productive cough with associated SOB, chest pain, and fever.  Pt states cough is productive of white or yellow sputum and today he noticed streaks and specks of blood.  He notes that 2 weeks ago he coughed up "8 ounces of blood" but he has otherwise had no hemoptysis before today.  He has inhalers at home and has been using them with some temporary relief of SOB.  He is on 2-L oxygen at night. He describes mid right sided chest pain.  He states his chest pain feels similar to pneumonia he has had in the past, however it was in his right lower chest.  He also reports intermittent associated wheezing, intermittent subjective fever and chills, "scratchy" throat and rhinorrhea with clear mucus.  He has not taken his temperature at home.  Pt admits to prior h/o similar symptoms and has been hospitalized.  He was last hospitalized 5 months ago for pneumonia and states his current symptoms feel similar.  He has never been on a ventilator.  He is a current 1.5-pack-per-day smoker.  He does not drink.  He lives at home with his wife.  PCP is DIXON,MARY BETH, PA-C at [facility] Pt has no pulmonologist   Past Medical History  Diagnosis Date  . COPD (chronic obstructive pulmonary disease)   . Hypercholesteremia   . History of pneumonia   . Polycythemia   . Coronary atherosclerosis of native coronary artery     Mild nonobstructive disease at catheterization 2007   . 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 Borderline diabetes   . Seizures     Last seizure 2 yrs ago  . GERD (gastroesophageal reflux disease)   . Type 2 diabetes mellitus   . DDD (degenerative disc disease)     Cervical and thoracic  . Chronic back pain   . Bronchitis   . Chronic left shoulder pain   . Chronic neck pain   . Atrial fibrillation     Not anticoagulated  . Smoker     Past Surgical History  Procedure Laterality Date  . Colonoscopy w/ endoscopic [place]    . Vasectomy    . Throat biopsy    . Lung biopsy    . Vasectomy  1987    Family History  Problem Relation Age of Onset  . Hypertension Mother   . Diabetes Mother   . Heart attack Mother   . Heart failure Mother   . Hypertension Sister   . Diabetes Sister   . Heart failure Sister     History  Substance Use Topics  . Smoking status: Current Every Day Smoker -- 0.50 packs/day for 45 years    Types: Cigarettes  . Smokeless tobacco: Former Systems developer     Comment: started smoking half pack 2 days ago per stopped in the hospital as of  07-20-2013  . Alcohol Use: No  smokes 1 1/2 ppd Lives at home Lives with spouse  Review of Systems  Constitutional: Positive for fever and chills.  HENT: Positive for rhinorrhea and sore throat ("scratchy").   Respiratory: Positive for cough and shortness of breath.   Cardiovascular: Positive for chest pain.  All other systems reviewed and are negative.     Allergies  Influenza vaccine live  Home Medications   Current Outpatient Rx  Name  Route  Sig  Dispense  Refill  . albuterol (VENTOLIN HFA) 108 (90 BASE) MCG/ACT inhaler   Inhalation   Inhale 2 puffs into the lungs every 4 (four) hours as needed. Shortness of breath         . aspirin EC 81 MG tablet   Oral   Take 81 mg by mouth daily.         . fluticasone (FLONASE) 50 MCG/ACT nasal spray   Each Nare   Place 2 sprays into both nostrils daily.         .  Fluticasone-Salmeterol (ADVAIR DISKUS) 250-50 MCG/DOSE AEPB   Inhalation   Inhale 1 puff into the lungs every 12 (twelve) hours.          . nitroGLYCERIN (NITROSTAT) 0.4 MG SL tablet   Sublingual   Place 1 tablet (0.4 mg total) under the tongue every 5 (five) minutes as needed for chest pain.   25 tablet   4   . tiotropium (SPIRIVA) 18 MCG inhalation capsule   Inhalation   Place 18 mcg into inhaler and inhale daily.           BP 110/72  Pulse 79  Resp 22  SpO2 97%  Vital signs normal    Physical Exam  Nursing note and vitals reviewed. Constitutional: He is oriented to person, place, and time. He appears well-developed and well-nourished.  Non-toxic appearance. He does not appear ill. No distress.  HENT:  Head: Normocephalic and atraumatic.  Right Ear: External ear normal.  Left Ear: External ear normal.  Nose: Nose normal. No mucosal edema or rhinorrhea.  Mouth/Throat: Oropharynx is clear and moist and mucous membranes are normal. No dental abscesses or uvula swelling.  Eyes: Conjunctivae and EOM are normal. Pupils are equal, round, and reactive to light.  Neck: Normal range of motion and full passive range of motion without pain. Neck supple.  Cardiovascular: Normal rate, regular rhythm and normal heart sounds.  Exam reveals no gallop and no friction rub.   No murmur heard. Pulmonary/Chest: He is in respiratory distress. He has decreased breath sounds. He has no wheezes. He has no rhonchi. He has no rales. He exhibits no tenderness and no crepitus.  Coughing frequently Retractions Very diminished breath sounds No wheezing or rhonchi  Abdominal: Soft. Normal appearance and bowel sounds are normal. He exhibits no distension. There is no tenderness. There is no rebound and no guarding.  Musculoskeletal: Normal range of motion. He exhibits no edema and no tenderness.  Moves all extremities well.   Neurological: He is alert and oriented to person, place, and time. He has  normal strength. No cranial nerve deficit.  Skin: Skin is warm, dry and intact. No rash noted. No erythema. No pallor.  Psychiatric: He has a normal mood and affect. His speech is normal and behavior is normal. His mood appears not anxious.    ED Course  Procedures (including critical care time)  Medications  albuterol (PROVENTIL,VENTOLIN) solution continuous neb (15 mg/hr Nebulization Given 10/20/13  1129)  ipratropium (ATROVENT) nebulizer solution 0.5 mg (0.5 mg Nebulization Given 10/20/13 1129)  ketorolac (TORADOL) 30 MG/ML injection 30 mg (30 mg Intravenous Given 10/20/13 1139)  cyclobenzaprine (FLEXERIL) tablet 5 mg (5 mg Oral Given 10/20/13 1136)  albuterol (PROVENTIL) (2.5 MG/3ML) 0.083% nebulizer solution 5 mg (5 mg Nebulization Given 10/20/13 1354)  ipratropium (ATROVENT) nebulizer solution 0.5 mg (0.5 mg Nebulization Given 10/20/13 1354)  iohexol (OMNIPAQUE) 350 MG/ML injection 100 mL (100 mLs Intravenous Contrast Given 10/20/13 1417)     DIAGNOSTIC STUDIES: Oxygen Saturation is 99% on room air, normal by my interpretation.    COORDINATION OF CARE: 11:03 AM-Discussed treatment plan which includes breathing treatment with pt at bedside and pt agreed to plan.   13:40 recheck pt has had his continuous nebulizer, he has improved air movement, still no wheezing or rhonchi, however he still is sitting up with retractions.  Discussed getting CT angio chest to make sure not missing PE or pneumonia not seen on CXR. Pt is agreeable.   Pt ambulated by nursing staff and his pulse ox was 90-94 % on RA.   15:13 Dr Humphrey Rolls he will admit and do orders  Labs Review Results for orders placed during the hospital encounter of 10/20/13  CBC WITH DIFFERENTIAL      Result Value Ref Range   WBC 5.2  4.0 - 10.5 K/uL   RBC 5.20  4.22 - 5.81 MIL/uL   Hemoglobin 15.1  13.0 - 17.0 g/dL   HCT 45.0  39.0 - 52.0 %   MCV 86.5  78.0 - 100.0 fL   MCH 29.0  26.0 - 34.0 pg   MCHC 33.6  30.0 - 36.0 g/dL   RDW 12.8  11.5  - 15.5 %   Platelets 208  150 - 400 K/uL   Neutrophils Relative % 56  43 - 77 %   Neutro Abs 2.9  1.7 - 7.7 K/uL   Lymphocytes Relative 33  12 - 46 %   Lymphs Abs 1.7  0.7 - 4.0 K/uL   Monocytes Relative 10  3 - 12 %   Monocytes Absolute 0.5  0.1 - 1.0 K/uL   Eosinophils Relative 1  0 - 5 %   Eosinophils Absolute 0.1  0.0 - 0.7 K/uL   Basophils Relative 0  0 - 1 %   Basophils Absolute 0.0  0.0 - 0.1 K/uL  COMPREHENSIVE METABOLIC PANEL      Result Value Ref Range   Sodium 138  137 - 147 mEq/L   Potassium 4.2  3.7 - 5.3 mEq/L   Chloride 100  96 - 112 mEq/L   CO2 28  19 - 32 mEq/L   Glucose, Bld 113 (*) 70 - 99 mg/dL   BUN 10  6 - 23 mg/dL   Creatinine, Ser 0.99  0.50 - 1.35 mg/dL   Calcium 9.1  8.4 - 10.5 mg/dL   Total Protein 7.5  6.0 - 8.3 g/dL   Albumin 3.2 (*) 3.5 - 5.2 g/dL   AST 15  0 - 37 U/L   ALT 17  0 - 53 U/L   Alkaline Phosphatase 73  39 - 117 U/L   Total Bilirubin 0.4  0.3 - 1.2 mg/dL   GFR calc non Af Amer >90  >90 mL/min   GFR calc Af Amer >90  >90 mL/min  TROPONIN I      Result Value Ref Range   Troponin I <0.30  <0.30 ng/mL  URINALYSIS, ROUTINE W REFLEX MICROSCOPIC  Result Value Ref Range   Color, Urine YELLOW  YELLOW   APPearance CLEAR  CLEAR   Specific Gravity, Urine 1.015  1.005 - 1.030   pH 6.0  5.0 - 8.0   Glucose, UA NEGATIVE  NEGATIVE mg/dL   Hgb urine dipstick NEGATIVE  NEGATIVE   Bilirubin Urine NEGATIVE  NEGATIVE   Ketones, ur NEGATIVE  NEGATIVE mg/dL   Protein, ur NEGATIVE  NEGATIVE mg/dL   Urobilinogen, UA 0.2  0.0 - 1.0 mg/dL   Nitrite NEGATIVE  NEGATIVE   Leukocytes, UA NEGATIVE  NEGATIVE  URINE RAPID DRUG SCREEN (HOSP PERFORMED)      Result Value Ref Range   Opiates NONE DETECTED  NONE DETECTED   Cocaine NONE DETECTED  NONE DETECTED   Benzodiazepines NONE DETECTED  NONE DETECTED   Amphetamines NONE DETECTED  NONE DETECTED   Tetrahydrocannabinol POSITIVE (*) NONE DETECTED   Barbiturates NONE DETECTED  NONE DETECTED     Laboratory interpretation all normal except + UDS   Imaging Review Dg Chest 2 View  10/20/2013   CLINICAL DATA:  Cough, chest pain, shortness of Breath  EXAM: CHEST  2 VIEW  COMPARISON:  07/13/2013  FINDINGS: Cardiomediastinal silhouette is stable. Mild hyperinflation. Stable cavitary lesion in left upper lobe measures 3 cm. There might be a second cavitary lesion in left upper lobe measures 1.9 cm. No acute infiltrate or pulmonary edema. Stable hyperinflation and chronic mild interstitial prominence  IMPRESSION: Mild hyperinflation. Stable cavitary lesion in left upper lobe measures 3 cm. There might be a second cavitary lesion in left upper lobe measures 1.9 cm. No acute infiltrate or pulmonary edema. Stable hyperinflation and chronic mild interstitial prominence   Electronically Signed   By: Lahoma Crocker M.D.   On: 10/20/2013 10:57   Ct Angio Chest W/cm &/or Wo Cm  10/20/2013   CLINICAL DATA:  Shortness of Breath  EXAM: CT ANGIOGRAPHY CHEST WITH CONTRAST  TECHNIQUE: Multidetector CT imaging of the chest was performed using the standard protocol during bolus administration of intravenous contrast. Multiplanar CT image reconstructions and MIPs were obtained to evaluate the vascular anatomy.  CONTRAST:  120mL OMNIPAQUE IOHEXOL 350 MG/ML SOLN  COMPARISON:  Chest CT June 28, 2013; chest radiograph October 20, 2013  FINDINGS: There is no demonstrable pulmonary embolus. There is no thoracic aortic aneurysm or dissection.  Extensive underlying emphysematous change is stable. The previously noted thick-walled cavitary lesion in the posterior segment of the left upper lobe is again noted. Currently, it measures 4.6 x 2.6 cm, unchanged from prior study. Nodularity within this cavitary lesion also remains stable in appearance. There is a stable 7 mm nodular opacity in the posterior lingula as well. This small lesion is not cavitated.  There is stable interstitial fibrosis in both lung bases. There is no new  fibrosis. There is again noted cicatrization with bronchiectasis in the left upper lobe which is stable.  There is a stable mildly prominent lymph node in the superior left hilum measuring 1.7 x 1.4 cm. In the mid left hilum, there is a stable lymph node measuring 1.5 x 1.0 cm. In the sub- carinal region, there is a mildly prominent lymph node measuring 1.9 x 1.1 cm, stable. In the right hilum, there is a mildly prominent lymph node measuring 1.6 x 1.2 cm. There is no new lymph node prominence on this study.  Pericardium is not thickened. There are foci of coronary artery calcification, noted previously.  In the visualized upper abdomen, there is fatty  change in the liver. Visualized upper abdominal structures otherwise appear normal. There are no blastic or lytic bone lesions. Thyroid appears normal.  Review of the MIP images confirms the above findings.  IMPRESSION: No demonstrable pulmonary embolus. Extensive emphysematous change remains with interstitial fibrosis in the lung bases, stable. The cavitary lesion remains in the left upper lobe without appreciable change as does a 7 mm nodular opacity in the posterior inferior lingula. There is no new lung opacity. Mild adenopathy remains without change. No new lesion identified.   Electronically Signed   By: Lowella Grip M.D.   On: 10/20/2013 14:43     EKG Interpretation   Date/Time:  Thursday October 20 2013 10:25:49 EDT Ventricular Rate:  74 PR Interval:  118 QRS Duration: 88 QT Interval:  378 QTC Calculation: 419 R Axis:   72 Text Interpretation:  Normal sinus rhythm Normal ECG When compared with  ECG of 10-Jul-2013 01:28, Sinus rhythm has replaced Atrial fibrillation  Vent. rate has decreased BY  88 BPM ST no longer depressed in Inferior  leads ST no longer depressed in Anterior leads Atrial fibrillation is no  longer Present Confirmed by Vashaun Osmon  MD-I, Jaritza Duignan ([id number]) on 10/20/2013 10:31:15  AM      MDM   Final diagnoses:  COPD exacerbation   Right-sided chest wall pain   Plan admission   Rolland Porter, MD, [place] Performed by: Kassadie Pancake L Corlette Ciano Total critical care time: 35 min Critical care time was exclusive of separately billable procedures and treating other patients. Critical care was necessary to treat or prevent imminent or life-threatening deterioration. Critical care was time spent personally by me on the following activities: development of treatment plan with patient and/or surrogate as well as nursing, discussions with consultants, evaluation of patient's response to treatment, examination of patient, obtaining history from patient or surrogate, ordering and performing treatments and interventions, ordering and review of laboratory studies, ordering and review of radiographic studies, pulse oximetry and re-evaluation of patient's condition.   I personally performed the services described in this documentation, which was scribed in my presence. The recorded information has been reviewed and considered.  Rolland Porter, MD, FACEP    Janice Norrie, MD 10/20/13 [phone]

## 2013-10-21 DIAGNOSIS — R042 Hemoptysis: Secondary | ICD-10-CM

## 2013-10-21 DIAGNOSIS — R05 Cough: Secondary | ICD-10-CM | POA: Diagnosis not present

## 2013-10-21 DIAGNOSIS — J441 Chronic obstructive pulmonary disease with (acute) exacerbation: Principal | ICD-10-CM

## 2013-10-21 DIAGNOSIS — J984 Other disorders of lung: Secondary | ICD-10-CM | POA: Diagnosis not present

## 2013-10-21 DIAGNOSIS — R059 Cough, unspecified: Secondary | ICD-10-CM

## 2013-10-21 DIAGNOSIS — E43 Unspecified severe protein-calorie malnutrition: Secondary | ICD-10-CM | POA: Diagnosis present

## 2013-10-21 LAB — CBC
HCT: 43.5 % (ref 39.0–52.0)
Hemoglobin: 14.6 g/dL (ref 13.0–17.0)
MCH: 29.3 pg (ref 26.0–34.0)
MCHC: 33.6 g/dL (ref 30.0–36.0)
MCV: 87.2 fL (ref 78.0–100.0)
PLATELETS: 212 10*3/uL (ref 150–400)
RBC: 4.99 MIL/uL (ref 4.22–5.81)
RDW: 12.8 % (ref 11.5–15.5)
WBC: 5.2 10*3/uL (ref 4.0–10.5)

## 2013-10-21 LAB — COMPREHENSIVE METABOLIC PANEL
ALK PHOS: 63 U/L (ref 39–117)
ALT: 17 U/L (ref 0–53)
AST: 14 U/L (ref 0–37)
Albumin: 2.8 g/dL — ABNORMAL LOW (ref 3.5–5.2)
BILIRUBIN TOTAL: 0.2 mg/dL — AB (ref 0.3–1.2)
BUN: 12 mg/dL (ref 6–23)
CHLORIDE: 98 meq/L (ref 96–112)
CO2: 20 meq/L (ref 19–32)
CREATININE: 0.95 mg/dL (ref 0.50–1.35)
Calcium: 8.7 mg/dL (ref 8.4–10.5)
GFR calc Af Amer: >90 mL/min (ref >90)
Glucose, Bld: 343 mg/dL — ABNORMAL HIGH (ref 70–99)
POTASSIUM: 3.9 meq/L (ref 3.7–5.3)
Sodium: 136 mEq/L — ABNORMAL LOW (ref 137–147)
Total Protein: 6.8 g/dL (ref 6.0–8.3)

## 2013-10-21 LAB — PROTIME-INR
INR: 1.11 (ref 0.00–1.49)
PROTHROMBIN TIME: 14.1 s (ref 11.6–15.2)

## 2013-10-21 LAB — HEMOGLOBIN A1C
HEMOGLOBIN A1C: 6.4 % — AB (ref <5.7)
MEAN PLASMA GLUCOSE: 137 mg/dL — AB (ref <117)

## 2013-10-21 LAB — GLUCOSE, CAPILLARY: GLUCOSE-CAPILLARY: 326 mg/dL — AB (ref 70–99)

## 2013-10-21 LAB — IGE: IGE (IMMUNOGLOBULIN E), SERUM: 53 [IU]/mL (ref 0.0–180.0)

## 2013-10-21 LAB — APTT: aPTT: 31 seconds (ref 24–37)

## 2013-10-21 MED ORDER — BUDESONIDE-FORMOTEROL FUMARATE 80-4.5 MCG/ACT IN AERO: 2.0000 | INHALATION_SPRAY | Freq: Two times a day (BID) | RESPIRATORY_TRACT | Status: DC

## 2013-10-21 MED ORDER — LEVOFLOXACIN 750 MG PO TABS: 750.0000 mg | ORAL_TABLET | Freq: Every day | ORAL | Status: DC

## 2013-10-21 MED ORDER — PREDNISONE 10 MG PO TABS: ORAL_TABLET | ORAL | Status: DC

## 2013-10-21 MED ORDER — BENZONATATE 100 MG PO CAPS
100.0000 mg | ORAL_CAPSULE | Freq: Once | ORAL | Status: AC
  Administered 2013-10-21: 100 mg via ORAL
  Filled 2013-10-21: qty 1

## 2013-10-21 MED ORDER — INSULIN ASPART 100 UNIT/ML ~~LOC~~ SOLN
0.0000 [IU] | Freq: Three times a day (TID) | SUBCUTANEOUS | Status: DC
  Administered 2013-10-21 (×3): 7 [IU] via SUBCUTANEOUS

## 2013-10-21 MED ORDER — INSULIN ASPART 100 UNIT/ML ~~LOC~~ SOLN: 0.0000 [IU] | Freq: Every day | SUBCUTANEOUS | Status: DC

## 2013-10-21 MED ORDER — MENTHOL 3 MG MT LOZG
1.0000 | LOZENGE | OROMUCOSAL | Status: DC | PRN
  Administered 2013-10-21: 3 mg via ORAL
  Filled 2013-10-21: qty 9

## 2013-10-21 NOTE — Progress Notes (Signed)
Nutrition Brief Note  Patient identified on the Malnutrition Screening Tool (MST) Report  Wt Readings from Last 15 Encounters:  10/20/13 140 lb 6.4 oz (63.685 kg)  07/20/13 142 lb (64.411 kg)  07/20/13 144 lb (65.318 kg)  07/11/13 149 lb 7.6 oz (67.8 kg)  06/28/13 140 lb (63.504 kg)  05/24/13 159 lb (72.122 kg)  04/22/13 139 lb (63.05 kg)  04/10/13 140 lb (63.504 kg)  02/17/13 140 lb (63.504 kg)  01/18/13 140 lb (63.504 kg)  12/21/12 141 lb (63.957 kg)  10/27/12 140 lb (63.504 kg)  10/04/12 140 lb (63.504 kg)  09/07/12 140 lb (63.504 kg)  08/29/12 140 lb (63.504 kg)    Body mass index is 21.35 kg/(m^2). Patient meets criteria for normal weight based on current BMI.   Current diet order is carb modified, patient is consuming approximately 85% of meals at this time. Labs and medications reviewed.   No nutrition interventions warranted at this time. If nutrition issues arise, please consult RD.   Jenalee Trevizo A. Jimmye Norman, RD, LDN Pager: [phone]

## 2013-10-21 NOTE — Progress Notes (Signed)
[facility] PROGRESS NOTE  Assessment/Plan: *Hemoptysis: - Ct chest 4.10.2015: cavitary lesion remains in the left upper lobe without appreciable  Change( seen on previous ct on 11.21.2015.) as does a 7 mm nodular opacity  - No leukocytosis.  Pulmonary cavitary lesion: - Consult pulmonary, will probably need biopsy and 7 mm nodule which is also unchanged. - AFB collected. - aspergillus titers.   Decompensated COPD with exacerbation (chronic obstructive pulmonary disease): - Started on steroids due to diffuse wheeze. - Will start duoneb nebulizer. - Continue with dulera    Code Status: Full Code Family Communication: Wife in room  Disposition Plan: Home    Consultants:  pulmonary  Procedures:  CT chest  Antibiotics:  Rocephin and azithromycin 4.10.2015.  HPI/Subjective: Wants to go home  Objective: Filed Vitals:   10/21/13 0008 10/21/13 0318 10/21/13 0419 10/21/13 0732  BP:   99/60   Pulse:   86   Temp:   97.6 F (36.4 C)   TempSrc:   Oral   Resp:   20   Height:      Weight:      SpO2: 93% 95% 94% 95%    Intake/Output Summary (Last 24 hours) at 10/21/13 0809 Last data filed at 10/20/13 2008  Gross per 24 hour  Intake    240 ml  Output      0 ml  Net    240 ml   Filed Weights   10/20/13 1700  Weight: 63.685 kg (140 lb 6.4 oz)    Exam:  General: Alert, awake, oriented x3, in no acute distress.  HEENT: No bruits, no goiter.  Heart: Regular rate and rhythm, without murmurs, rubs, gallops.  Lungs: Good air movement, wheezing. Abdomen: Soft, nontender, nondistended, positive bowel sounds.    Data Reviewed: Basic Metabolic Panel:  Recent Labs Lab 10/20/13 1131 10/21/13 0503  NA 138 136*  K 4.2 3.9  CL 100 98  CO2 28 20  GLUCOSE 113* 343*  BUN 10 12  CREATININE 0.99 0.95  CALCIUM 9.1 8.7   Liver Function Tests:  Recent Labs Lab 10/20/13 1131 10/21/13 0503  AST 15 14  ALT 17 17  ALKPHOS 73 63  BILITOT 0.4 0.2*    PROT 7.5 6.8  ALBUMIN 3.2* 2.8*   No results found for this basename: LIPASE, AMYLASE,  in the last 168 hours No results found for this basename: AMMONIA,  in the last 168 hours CBC:  Recent Labs Lab 10/20/13 1131 10/21/13 0503  WBC 5.2 5.2  NEUTROABS 2.9  --   HGB 15.1 14.6  HCT 45.0 43.5  MCV 86.5 87.2  PLT 208 212   Cardiac Enzymes:  Recent Labs Lab 10/20/13 1131  TROPONINI <0.30   BNP (last 3 results)  Recent Labs  07/10/13 0159  PROBNP 79.5   CBG: No results found for this basename: GLUCAP,  in the last 168 hours  No results found for this or any previous visit (from the past 240 hour(s)).   Studies: Dg Chest 2 View  10/20/2013   CLINICAL DATA:  Cough, chest pain, shortness of Breath  EXAM: CHEST  2 VIEW  COMPARISON:  07/13/2013  FINDINGS: Cardiomediastinal silhouette is stable. Mild hyperinflation. Stable cavitary lesion in left upper lobe measures 3 cm. There might be a second cavitary lesion in left upper lobe measures 1.9 cm. No acute infiltrate or pulmonary edema. Stable hyperinflation and chronic mild interstitial prominence  IMPRESSION: Mild hyperinflation. Stable cavitary lesion in left upper lobe measures 3  cm. There might be a second cavitary lesion in left upper lobe measures 1.9 cm. No acute infiltrate or pulmonary edema. Stable hyperinflation and chronic mild interstitial prominence   Electronically Signed   By: Lahoma Crocker M.D.   On: 10/20/2013 10:57   Ct Angio Chest W/cm &/or Wo Cm  10/20/2013   CLINICAL DATA:  Shortness of Breath  EXAM: CT ANGIOGRAPHY CHEST WITH CONTRAST  TECHNIQUE: Multidetector CT imaging of the chest was performed using the standard protocol during bolus administration of intravenous contrast. Multiplanar CT image reconstructions and MIPs were obtained to evaluate the vascular anatomy.  CONTRAST:  131mL OMNIPAQUE IOHEXOL 350 MG/ML SOLN  COMPARISON:  Chest CT June 28, 2013; chest radiograph October 20, 2013  FINDINGS: There is no  demonstrable pulmonary embolus. There is no thoracic aortic aneurysm or dissection.  Extensive underlying emphysematous change is stable. The previously noted thick-walled cavitary lesion in the posterior segment of the left upper lobe is again noted. Currently, it measures 4.6 x 2.6 cm, unchanged from prior study. Nodularity within this cavitary lesion also remains stable in appearance. There is a stable 7 mm nodular opacity in the posterior lingula as well. This small lesion is not cavitated.  There is stable interstitial fibrosis in both lung bases. There is no new fibrosis. There is again noted cicatrization with bronchiectasis in the left upper lobe which is stable.  There is a stable mildly prominent lymph node in the superior left hilum measuring 1.7 x 1.4 cm. In the mid left hilum, there is a stable lymph node measuring 1.5 x 1.0 cm. In the sub- carinal region, there is a mildly prominent lymph node measuring 1.9 x 1.1 cm, stable. In the right hilum, there is a mildly prominent lymph node measuring 1.6 x 1.2 cm. There is no new lymph node prominence on this study.  Pericardium is not thickened. There are foci of coronary artery calcification, noted previously.  In the visualized upper abdomen, there is fatty change in the liver. Visualized upper abdominal structures otherwise appear normal. There are no blastic or lytic bone lesions. Thyroid appears normal.  Review of the MIP images confirms the above findings.  IMPRESSION: No demonstrable pulmonary embolus. Extensive emphysematous change remains with interstitial fibrosis in the lung bases, stable. The cavitary lesion remains in the left upper lobe without appreciable change as does a 7 mm nodular opacity in the posterior inferior lingula. There is no new lung opacity. Mild adenopathy remains without change. No new lesion identified.   Electronically Signed   By: Lowella Grip M.D.   On: 10/20/2013 14:43    Scheduled Meds: . aspirin EC  81 mg Oral  Daily  . budesonide-formoterol  2 puff Inhalation BID  . cefTRIAXone (ROCEPHIN)  IV  1 g Intravenous Q24H  . clindamycin (CLEOCIN) IV  600 mg Intravenous Q6H  . fluticasone  2 spray Each Nare Daily  . folic acid  1 mg Oral Daily  . insulin aspart  0-5 Units Subcutaneous QHS  . insulin aspart  0-9 Units Subcutaneous TID WC  . ipratropium-albuterol  3 mL Nebulization Q4H  . methylPREDNISolone (SOLU-MEDROL) injection  60 mg Intravenous Q6H  . multivitamin with minerals  1 tablet Oral Daily  . thiamine  100 mg Oral Daily   Continuous Infusions: . dextrose 5 % and 0.45% NaCl 50 mL/hr at 10/20/13 2008     [facility] Pager [phone].  If 8PM-8AM, please contact night-coverage at [email or web address], password [facility] 10/21/2013,  8:09 AM  LOS: 1 day

## 2013-10-21 NOTE — Progress Notes (Signed)
UR completed 

## 2013-10-21 NOTE — Progress Notes (Signed)
Patient discharged with instructions, prescriptions, and carenotes. He verbalized understanding via teach back method.  Notified Dr. Sheran Fava of the patient stating that he was suppose to get a oain med script at discharge.  She attempted to notify Dr. Olevia Bowens but was unsuccessful.  She stated that since the pain the he has is chronic the patient would need to f/u with his primary for narcotics otherwise she advised he should take tylenol for pain.  He wasn't happy but he verbalized understanding.  The patient left the floor with staff via w/c in stable condition.

## 2013-10-21 NOTE — Discharge Summary (Signed)
Physician Discharge Summary  Ryan [name] HYQ:[id number] DOB: [birth year] DOA: 10/20/2013  PCP: [name], PA-C  Admit date: 10/20/2013 Discharge date: 10/21/2013  Time spent: 35 minutes  Recommendations for Outpatient Follow-up:  1. Follow up with pulmonary as an outpatient.  Discharge Diagnoses:  Principal Problem:   Hemoptysis Active Problems:   TOBACCO ABUSE   COPD   Cough   Decompensated COPD with exacerbation (chronic obstructive pulmonary disease)   Pulmonary cavitary lesion   Protein-calorie malnutrition, severe   Discharge Condition: stable  Diet recommendation: regular  Filed Weights   10/20/13 1700  Weight: 63.685 kg (140 lb 6.4 oz)    History of present illness:  53 y.o. male presents with coughing up blood. He states that this has been going on for several weeks now. It had improved but again started and so he decided to come in for evaluation. He states that in the past he has had a hole in the lung and was seen in [place] for this. Patient states that he was treated and apparently it had improved but is not sure. He is a smoker and has been for many years. He states he hs no weight loss and has no night sweats. He has had some pain in the chest also mainly on the left side. In addtion he tates that he has had a diagnosis of COPD in the past. He states that he has been on oxygen at home and also does use inhalers fairly regularly. No syncope no nausea and vomiting noted. He has had SOB noted and also admits to having wheeze noted. There is a prior history of pneumonia.   Hospital Course:  Hemoptysis: - Ct chest 4.10.2015: cavitary lesion remains in the left upper lobe without appreciable Change( seen on previous ct on 11.21.2015.) as does a 7 mm nodular opacity. - No leukocytosis.   Pulmonary cavitary lesion:  - Consult pulmonary, recommended abx 10 days. - he has had a biopsy about 18 months ago.  Decompensated COPD with exacerbation (chronic  obstructive pulmonary disease):  - Started on steroids due to diffuse wheeze. Cont steroids tapered at home. - Will start duoneb nebulizer.  - Continue with dulera   Procedures:  CT chest  Consultations:  pulmonayr  Discharge Exam: Filed Vitals:   10/21/13 0419  BP: 99/60  Pulse: 86  Temp: 97.6 F (36.4 C)  Resp: 20    General: A&O x3 Cardiovascular: RRR Respiratory: good air movement CTA B/L  Discharge Instructions You were cared for by a hospitalist during your hospital stay. If you have any questions about your discharge medications or the care you received while you were in the hospital after you are discharged, you can call the unit and asked to speak with the hospitalist on call if the hospitalist that took care of you is not available. Once you are discharged, your primary care physician will handle any further medical issues. Please note that NO REFILLS for any discharge medications will be authorized once you are discharged, as it is imperative that you return to your primary care physician (or establish a relationship with a primary care physician if you do not have one) for your aftercare needs so that they can reassess your need for medications and monitor your lab values.      Discharge Orders   Future Orders Complete By Expires   Diet - low sodium heart healthy  As directed    Increase activity slowly  As directed  Medication List         ADVAIR DISKUS 250-50 MCG/DOSE Aepb  Generic drug:  Fluticasone-Salmeterol  Inhale 1 puff into the lungs every 12 (twelve) hours.     aspirin EC 81 MG tablet  Take 81 mg by mouth daily.     budesonide-formoterol 80-4.5 MCG/ACT inhaler  Commonly known as:  SYMBICORT  Inhale 2 puffs into the lungs 2 (two) times daily.     fluticasone 50 MCG/ACT nasal spray  Commonly known as:  FLONASE  Place 2 sprays into both nostrils daily.     levofloxacin 750 MG tablet  Commonly known as:  LEVAQUIN  Take 1 tablet (750  mg total) by mouth daily.     nitroGLYCERIN 0.4 MG SL tablet  Commonly known as:  NITROSTAT  Place 1 tablet (0.4 mg total) under the tongue every 5 (five) minutes as needed for chest pain.     predniSONE 10 MG tablet  Commonly known as:  DELTASONE  Takes 6 tablets for 1 days, then 5 tablets for 1 days, then 4 tablets for 1 days, then 3 tablets for 1 days, then 2 tabs for 1 days, then 1 tab for 1 days, and then stop.     tiotropium 18 MCG inhalation capsule  Commonly known as:  SPIRIVA  Place 18 mcg into inhaler and inhale daily.     VENTOLIN HFA 108 (90 BASE) MCG/ACT inhaler  Generic drug:  albuterol  Inhale 2 puffs into the lungs every 4 (four) hours as needed. Shortness of breath       Allergies  Allergen Reactions  . Influenza Vaccine Live Swelling   Follow-up Information   Follow up with HAWKINS,EDWARD L, MD In 2 weeks. (hospital follow up)    Specialty:  Pulmonary Disease   Contact information:   [place] [address] [phone]        The results of significant diagnostics from this hospitalization (including imaging, microbiology, ancillary and laboratory) are listed below for reference.    Significant Diagnostic Studies: Dg Chest 2 View  10/20/2013   CLINICAL DATA:  Cough, chest pain, shortness of Breath  EXAM: CHEST  2 VIEW  COMPARISON:  07/13/2013  FINDINGS: Cardiomediastinal silhouette is stable. Mild hyperinflation. Stable cavitary lesion in left upper lobe measures 3 cm. There might be a second cavitary lesion in left upper lobe measures 1.9 cm. No acute infiltrate or pulmonary edema. Stable hyperinflation and chronic mild interstitial prominence  IMPRESSION: Mild hyperinflation. Stable cavitary lesion in left upper lobe measures 3 cm. There might be a second cavitary lesion in left upper lobe measures 1.9 cm. No acute infiltrate or pulmonary edema. Stable hyperinflation and chronic mild interstitial prominence   Electronically  Signed   By: Lahoma Crocker M.D.   On: 10/20/2013 10:57   Ct Angio Chest W/cm &/or Wo Cm  10/20/2013   CLINICAL DATA:  Shortness of Breath  EXAM: CT ANGIOGRAPHY CHEST WITH CONTRAST  TECHNIQUE: Multidetector CT imaging of the chest was performed using the standard protocol during bolus administration of intravenous contrast. Multiplanar CT image reconstructions and MIPs were obtained to evaluate the vascular anatomy.  CONTRAST:  129mL OMNIPAQUE IOHEXOL 350 MG/ML SOLN  COMPARISON:  Chest CT June 28, 2013; chest radiograph October 20, 2013  FINDINGS: There is no demonstrable pulmonary embolus. There is no thoracic aortic aneurysm or dissection.  Extensive underlying emphysematous change is stable. The previously noted thick-walled cavitary lesion in the posterior segment of the left  upper lobe is again noted. Currently, it measures 4.6 x 2.6 cm, unchanged from prior study. Nodularity within this cavitary lesion also remains stable in appearance. There is a stable 7 mm nodular opacity in the posterior lingula as well. This small lesion is not cavitated.  There is stable interstitial fibrosis in both lung bases. There is no new fibrosis. There is again noted cicatrization with bronchiectasis in the left upper lobe which is stable.  There is a stable mildly prominent lymph node in the superior left hilum measuring 1.7 x 1.4 cm. In the mid left hilum, there is a stable lymph node measuring 1.5 x 1.0 cm. In the sub- carinal region, there is a mildly prominent lymph node measuring 1.9 x 1.1 cm, stable. In the right hilum, there is a mildly prominent lymph node measuring 1.6 x 1.2 cm. There is no new lymph node prominence on this study.  Pericardium is not thickened. There are foci of coronary artery calcification, noted previously.  In the visualized upper abdomen, there is fatty change in the liver. Visualized upper abdominal structures otherwise appear normal. There are no blastic or lytic bone lesions. Thyroid appears  normal.  Review of the MIP images confirms the above findings.  IMPRESSION: No demonstrable pulmonary embolus. Extensive emphysematous change remains with interstitial fibrosis in the lung bases, stable. The cavitary lesion remains in the left upper lobe without appreciable change as does a 7 mm nodular opacity in the posterior inferior lingula. There is no new lung opacity. Mild adenopathy remains without change. No new lesion identified.   Electronically Signed   By: Lowella Grip M.D.   On: 10/20/2013 14:43    Microbiology: No results found for this or any previous visit (from the past 240 hour(s)).   Labs: Basic Metabolic Panel:  Recent Labs Lab 10/20/13 1131 10/21/13 0503  NA 138 136*  K 4.2 3.9  CL 100 98  CO2 28 20  GLUCOSE 113* 343*  BUN 10 12  CREATININE 0.99 0.95  CALCIUM 9.1 8.7   Liver Function Tests:  Recent Labs Lab 10/20/13 1131 10/21/13 0503  AST 15 14  ALT 17 17  ALKPHOS 73 63  BILITOT 0.4 0.2*  PROT 7.5 6.8  ALBUMIN 3.2* 2.8*   No results found for this basename: LIPASE, AMYLASE,  in the last 168 hours No results found for this basename: AMMONIA,  in the last 168 hours CBC:  Recent Labs Lab 10/20/13 1131 10/21/13 0503  WBC 5.2 5.2  NEUTROABS 2.9  --   HGB 15.1 14.6  HCT 45.0 43.5  MCV 86.5 87.2  PLT 208 212   Cardiac Enzymes:  Recent Labs Lab 10/20/13 1131  TROPONINI <0.30   BNP: BNP (last 3 results)  Recent Labs  07/10/13 0159  PROBNP 79.5   CBG: No results found for this basename: GLUCAP,  in the last 168 hours     Signed:  Charlynne Cousins  [facility] 10/21/2013, 8:42 AM

## 2013-10-21 NOTE — Progress Notes (Signed)
Inpatient Diabetes Program Recommendations  AACE/ADA: New Consensus Statement on Inpatient Glycemic Control (2013)  Target Ranges:  Prepandial:   less than 140 mg/dL      Peak postprandial:   less than 180 mg/dL (1-2 hours)      Critically ill patients:  140 - 180 mg/dL    Results for Ryan [name], Ryan [name] (MRN [id number]) as of 10/21/2013 07:46  Ref. Range 10/20/2013 11:31 10/21/2013 05:03  Glucose Latest Range: 70-99 mg/dL 113 (H) 343 (H)   Diabetes history: DM2 Outpatient Diabetes medications: None (diet controlled) Current orders for Inpatient glycemic control: None  Inpatient Diabetes Program Recommendations Correction (SSI): While inpatient, please ordering CBGs with Novolog correction scale ACHS. HgbA1C: Last A1C was 6.6% on 07/10/13.  Please order an A1C to evaluate glycemic control over the past 2-3 months.  Note: Patient has a history of diabetes but does not take any DM medications as an outpatient.  Noted patient is ordered Solumedrol 60 mg Q6H and D50.45%NS @ 50 ml/hr which are contributing to hyperglycemia.  Please order CBGs with Novolog correction ACHS and an A1C.  Thanks, Barnie Alderman, RN, MSN, CCRN Diabetes Coordinator Inpatient Diabetes Program [phone] (Team Pager) [phone] ([facility] office) [phone] [facility] office)

## 2013-10-21 NOTE — Consult Note (Signed)
Consult requested by: [facility] Consult requested for hemoptysis:  HPI: This is a 53 year old who came to the hospital with a 2 to three-week history of cough congestion shortness of breath and hemoptysis. His hemoptysis has mostly been blood-tinged sputum. He's not aware of any fever. He's not had any chest pain. He is known to have cavitary lesion in his chest and he has grown out mycobacterium avium intracellular area. He has been followed by thoracic surgery and infectious disease and has been discharged by thoracic surgery and he's no longer taking medications for Mycobacterium. He does have pretty severe COPD and continues to smoke cigarettes  Past Medical History  Diagnosis Date  . COPD (chronic obstructive pulmonary disease)   . Hypercholesteremia   . History of pneumonia   . Polycythemia   . Coronary atherosclerosis of native coronary artery     Mild nonobstructive disease at catheterization 2007  . 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 Borderline diabetes   . Seizures     Last seizure 2 yrs ago  . GERD (gastroesophageal reflux disease)   . Type 2 diabetes mellitus   . DDD (degenerative disc disease)     Cervical and thoracic  . Chronic back pain   . Bronchitis   . Chronic left shoulder pain   . Chronic neck pain   . Atrial fibrillation     Not anticoagulated  . Smoker      Family History  Problem Relation Age of Onset  . Hypertension Mother   . Diabetes Mother   . Heart attack Mother   . Heart failure Mother   . Hypertension Sister   . Diabetes Sister   . Heart failure Sister      History   Social History  . Marital Status: Married    Spouse Name: N/A    Number of Children: N/A  . Years of Education: 10   Social History Main Topics  . Smoking status: Current Every Day Smoker -- 0.50 packs/day for 45 years    Types: Cigarettes  . Smokeless tobacco: Former Systems developer     Comment: started smoking half pack 2 days  ago per stopped in the hospital as of 07-20-2013  . Alcohol Use: No  . Drug Use: Yes    Special: Marijuana  . Sexual Activity: Yes    Birth Control/ Protection: Surgical   Other Topics Concern  . None   Social History Narrative  . None     ROS: He has not had any chest pain. No edema. No abdominal pain nausea vomiting or diarrhea otherwise negative    Objective: Vital signs in last 24 hours: Temp:  [97.4 F (36.3 C)-98.4 F (36.9 C)] 97.6 F (36.4 C) (04/10 0419) Pulse Rate:  [72-86] 86 (04/10 0419) Resp:  [18-22] 20 (04/10 0419) BP: (98-112)/(55-72) 99/60 mmHg (04/10 0419) SpO2:  [87 %-99 %] 95 % (04/10 0732) Weight:  [63.685 kg (140 lb 6.4 oz)] 63.685 kg (140 lb 6.4 oz) (04/09 1700) Weight change:  Last BM Date: 10/20/13  Intake/Output from previous day: 04/09 0701 - 04/10 0700 In: 240 [P.O.:240] Out: -   PHYSICAL EXAM He is awake and alert. He is in no acute distress. His HEENT exam is unremarkable. His neck is supple without masses. His chest shows some rhonchi bilaterally. His heart is regular without gallop. His abdomen is soft no masses are felt. Extremities show a distinct clubbing of the digits but no edema.  Central nervous system examination is grossly  Lab Results: Basic Metabolic Panel:  Recent Labs  10/20/13 1131 10/21/13 0503  NA 138 136*  K 4.2 3.9  CL 100 98  CO2 28 20  GLUCOSE 113* 343*  BUN 10 12  CREATININE 0.99 0.95  CALCIUM 9.1 8.7   Liver Function Tests:  Recent Labs  10/20/13 1131 10/21/13 0503  AST 15 14  ALT 17 17  ALKPHOS 73 63  BILITOT 0.4 0.2*  PROT 7.5 6.8  ALBUMIN 3.2* 2.8*   No results found for this basename: LIPASE, AMYLASE,  in the last 72 hours No results found for this basename: AMMONIA,  in the last 72 hours CBC:  Recent Labs  10/20/13 1131 10/21/13 0503  WBC 5.2 5.2  NEUTROABS 2.9  --   HGB 15.1 14.6  HCT 45.0 43.5  MCV 86.5 87.2  PLT 208 212   Cardiac Enzymes:  Recent Labs  10/20/13 1131   TROPONINI <0.30   BNP: No results found for this basename: PROBNP,  in the last 72 hours D-Dimer: No results found for this basename: DDIMER,  in the last 72 hours CBG: No results found for this basename: GLUCAP,  in the last 72 hours Hemoglobin A1C: No results found for this basename: HGBA1C,  in the last 72 hours Fasting Lipid Panel: No results found for this basename: CHOL, HDL, LDLCALC, TRIG, CHOLHDL, LDLDIRECT,  in the last 72 hours Thyroid Function Tests: No results found for this basename: TSH, T4TOTAL, FREET4, T3FREE, THYROIDAB,  in the last 72 hours Anemia Panel: No results found for this basename: VITAMINB12, FOLATE, FERRITIN, TIBC, IRON, RETICCTPCT,  in the last 72 hours Coagulation:  Recent Labs  10/21/13 0503  LABPROT 14.1  INR 1.11   Urine Drug Screen: Drugs of Abuse     Component Value Date/Time   LABOPIA NONE DETECTED 10/20/2013 1345   COCAINSCRNUR NONE DETECTED 10/20/2013 1345   LABBENZ NONE DETECTED 10/20/2013 1345   AMPHETMU NONE DETECTED 10/20/2013 1345   THCU POSITIVE* 10/20/2013 1345   LABBARB NONE DETECTED 10/20/2013 1345    Alcohol Level: No results found for this basename: ETH,  in the last 72 hours Urinalysis:  Recent Labs  10/20/13 1345  COLORURINE YELLOW  LABSPEC 1.015  PHURINE 6.0  [place]  UROBILINOGEN 0.2  [place]. Labs:   ABGS: No results found for this basename: PHART, PCO2, PO2ART, TCO2, HCO3,  in the last 72 hours   MICROBIOLOGY: No results found for this or any previous visit (from the past 240 hour(s)).  Studies/Results: Dg Chest 2 View  10/20/2013   CLINICAL DATA:  Cough, chest pain, shortness of Breath  EXAM: CHEST  2 VIEW  COMPARISON:  07/13/2013  FINDINGS: Cardiomediastinal silhouette is stable. Mild hyperinflation. Stable cavitary lesion in left upper lobe measures 3 cm. There might be a second  cavitary lesion in left upper lobe measures 1.9 cm. No acute infiltrate or pulmonary edema. Stable hyperinflation and chronic mild interstitial prominence  IMPRESSION: Mild hyperinflation. Stable cavitary lesion in left upper lobe measures 3 cm. There might be a second cavitary lesion in left upper lobe measures 1.9 cm. No acute infiltrate or pulmonary edema. Stable hyperinflation and chronic mild interstitial prominence   Electronically Signed   By: Lahoma Crocker M.D.   On: 10/20/2013 10:57   Ct Angio Chest W/cm &/or Wo Cm  10/20/2013  CLINICAL DATA:  Shortness of Breath  EXAM: CT ANGIOGRAPHY CHEST WITH CONTRAST  TECHNIQUE: Multidetector CT imaging of the chest was performed using the standard protocol during bolus administration of intravenous contrast. Multiplanar CT image reconstructions and MIPs were obtained to evaluate the vascular anatomy.  CONTRAST:  165mL OMNIPAQUE IOHEXOL 350 MG/ML SOLN  COMPARISON:  Chest CT June 28, 2013; chest radiograph October 20, 2013  FINDINGS: There is no demonstrable pulmonary embolus. There is no thoracic aortic aneurysm or dissection.  Extensive underlying emphysematous change is stable. The previously noted thick-walled cavitary lesion in the posterior segment of the left upper lobe is again noted. Currently, it measures 4.6 x 2.6 cm, unchanged from prior study. Nodularity within this cavitary lesion also remains stable in appearance. There is a stable 7 mm nodular opacity in the posterior lingula as well. This small lesion is not cavitated.  There is stable interstitial fibrosis in both lung bases. There is no new fibrosis. There is again noted cicatrization with bronchiectasis in the left upper lobe which is stable.  There is a stable mildly prominent lymph node in the superior left hilum measuring 1.7 x 1.4 cm. In the mid left hilum, there is a stable lymph node measuring 1.5 x 1.0 cm. In the sub- carinal region, there is a mildly prominent lymph node measuring 1.9 x 1.1  cm, stable. In the right hilum, there is a mildly prominent lymph node measuring 1.6 x 1.2 cm. There is no new lymph node prominence on this study.  Pericardium is not thickened. There are foci of coronary artery calcification, noted previously.  In the visualized upper abdomen, there is fatty change in the liver. Visualized upper abdominal structures otherwise appear normal. There are no blastic or lytic bone lesions. Thyroid appears normal.  Review of the MIP images confirms the above findings.  IMPRESSION: No demonstrable pulmonary embolus. Extensive emphysematous change remains with interstitial fibrosis in the lung bases, stable. The cavitary lesion remains in the left upper lobe without appreciable change as does a 7 mm nodular opacity in the posterior inferior lingula. There is no new lung opacity. Mild adenopathy remains without change. No new lesion identified.   Electronically Signed   By: Lowella Grip M.D.   On: 10/20/2013 14:43    Medications:  Prior to Admission:  Prescriptions prior to admission  Medication Sig Dispense Refill  . albuterol (VENTOLIN HFA) 108 (90 BASE) MCG/ACT inhaler Inhale 2 puffs into the lungs every 4 (four) hours as needed. Shortness of breath      . aspirin EC 81 MG tablet Take 81 mg by mouth daily.      . fluticasone (FLONASE) 50 MCG/ACT nasal spray Place 2 sprays into both nostrils daily.      . Fluticasone-Salmeterol (ADVAIR DISKUS) 250-50 MCG/DOSE AEPB Inhale 1 puff into the lungs every 12 (twelve) hours.       [name] tiotropium (SPIRIVA) 18 MCG inhalation capsule Place 18 mcg into inhaler and inhale daily.      . nitroGLYCERIN (NITROSTAT) 0.4 MG SL tablet Place 1 tablet (0.4 mg total) under the tongue every 5 (five) minutes as needed for chest pain.  25 tablet  4   Scheduled: . aspirin EC  81 mg Oral Daily  . budesonide-formoterol  2 puff Inhalation BID  . cefTRIAXone (ROCEPHIN)  IV  1 g Intravenous Q24H  . clindamycin (CLEOCIN) IV  600 mg Intravenous Q6H   . fluticasone  2 spray Each Nare Daily  . folic acid  1 mg Oral Daily  . insulin aspart  0-5 Units Subcutaneous QHS  . insulin aspart  0-9 Units Subcutaneous TID WC  . ipratropium-albuterol  3 mL Nebulization Q4H  . methylPREDNISolone (SOLU-MEDROL) injection  60 mg Intravenous Q6H  . multivitamin with minerals  1 tablet Oral Daily  . thiamine  100 mg Oral Daily   Continuous: . dextrose 5 % and 0.45% NaCl 50 mL/hr at 10/20/13 2008   DIY:[id number], ketorolac, menthol-cetylpyridinium, nitroGLYCERIN  Assesment: He has hemoptysis and I think that's related to COPD and is cavitary lesion. This cavitary lesion is unchanged over 2 years now. I think this represents an acute infection possibly of the cavity although there was not an air-fluid level and possibly simply an acute bronchitis on top of his COPD Principal Problem:   Hemoptysis Active Problems:   TOBACCO ABUSE   COPD   Cough   Decompensated COPD with exacerbation (chronic obstructive pulmonary disease)   Pulmonary cavitary lesion   Protein-calorie malnutrition, severe    Plan: I discussed with Dr. Olevia Bowens and I think he's okay to go home on oral antibiotics    LOS: 1 day   Alonza Bogus 10/21/2013, 8:53 AM

## 2013-10-24 DIAGNOSIS — R079 Chest pain, unspecified: Secondary | ICD-10-CM | POA: Diagnosis not present

## 2013-10-24 DIAGNOSIS — J42 Unspecified chronic bronchitis: Secondary | ICD-10-CM | POA: Diagnosis not present

## 2013-10-25 LAB — ASPERGILLUS ANTIBODY BY IMMUNODIFF: Aspergillus Antibody ID: NEGATIVE

## 2013-10-31 ENCOUNTER — Emergency Department (HOSPITAL_COMMUNITY)
Admission: EM | Admit: 2013-10-31 | Discharge: 2013-10-31 | Disposition: A | Discharge status: Home / Self Care | Payer: Medicare Other | Attending: Emergency Medicine | Admitting: Emergency Medicine

## 2013-10-31 ENCOUNTER — Encounter (HOSPITAL_COMMUNITY): Payer: Self-pay | Admitting: Emergency Medicine

## 2013-10-31 ENCOUNTER — Emergency Department (HOSPITAL_COMMUNITY): Payer: Medicare Other

## 2013-10-31 DIAGNOSIS — I2510 Atherosclerotic heart disease of native coronary artery without angina pectoris: Secondary | ICD-10-CM | POA: Diagnosis not present

## 2013-10-31 DIAGNOSIS — F17200 Nicotine dependence, unspecified, uncomplicated: Secondary | ICD-10-CM | POA: Diagnosis not present

## 2013-10-31 DIAGNOSIS — R0602 Shortness of breath: Secondary | ICD-10-CM | POA: Diagnosis not present

## 2013-10-31 DIAGNOSIS — Z8701 Personal history of pneumonia (recurrent): Secondary | ICD-10-CM | POA: Diagnosis not present

## 2013-10-31 DIAGNOSIS — Z8739 Personal history of other diseases of the musculoskeletal system and connective tissue: Secondary | ICD-10-CM | POA: Diagnosis not present

## 2013-10-31 DIAGNOSIS — Z7982 Long term (current) use of aspirin: Secondary | ICD-10-CM | POA: Insufficient documentation

## 2013-10-31 DIAGNOSIS — IMO0002 Reserved for concepts with insufficient information to code with codable children: Secondary | ICD-10-CM | POA: Insufficient documentation

## 2013-10-31 DIAGNOSIS — Z862 Personal history of diseases of the blood and blood-forming organs and certain disorders involving the immune mechanism: Secondary | ICD-10-CM | POA: Diagnosis not present

## 2013-10-31 DIAGNOSIS — J438 Other emphysema: Secondary | ICD-10-CM | POA: Diagnosis not present

## 2013-10-31 DIAGNOSIS — E7800 Pure hypercholesterolemia, unspecified: Secondary | ICD-10-CM | POA: Diagnosis not present

## 2013-10-31 DIAGNOSIS — E119 Type 2 diabetes mellitus without complications: Secondary | ICD-10-CM | POA: Insufficient documentation

## 2013-10-31 DIAGNOSIS — G8929 Other chronic pain: Secondary | ICD-10-CM | POA: Insufficient documentation

## 2013-10-31 DIAGNOSIS — Z792 Long term (current) use of antibiotics: Secondary | ICD-10-CM | POA: Diagnosis not present

## 2013-10-31 DIAGNOSIS — Z8719 Personal history of other diseases of the digestive system: Secondary | ICD-10-CM | POA: Diagnosis not present

## 2013-10-31 DIAGNOSIS — J441 Chronic obstructive pulmonary disease with (acute) exacerbation: Secondary | ICD-10-CM | POA: Insufficient documentation

## 2013-10-31 DIAGNOSIS — I4891 Unspecified atrial fibrillation: Secondary | ICD-10-CM | POA: Insufficient documentation

## 2013-10-31 DIAGNOSIS — J8410 Pulmonary fibrosis, unspecified: Secondary | ICD-10-CM | POA: Diagnosis not present

## 2013-10-31 LAB — BASIC METABOLIC PANEL
BUN: 12 mg/dL (ref 6–23)
CALCIUM: 8.3 mg/dL — AB (ref 8.4–10.5)
CO2: 21 meq/L (ref 19–32)
Chloride: 97 mEq/L (ref 96–112)
Creatinine, Ser: 1.07 mg/dL (ref 0.50–1.35)
GFR calc non Af Amer: 77 mL/min — ABNORMAL LOW (ref >90)
GFR, EST AFRICAN AMERICAN: 89 mL/min — AB (ref >90)
Glucose, Bld: 118 mg/dL — ABNORMAL HIGH (ref 70–99)
Potassium: 3.9 mEq/L (ref 3.7–5.3)
SODIUM: 133 meq/L — AB (ref 137–147)

## 2013-10-31 LAB — CBC
HCT: 48.1 % (ref 39.0–52.0)
Hemoglobin: 16.8 g/dL (ref 13.0–17.0)
MCH: 30 pg (ref 26.0–34.0)
MCHC: 34.9 g/dL (ref 30.0–36.0)
MCV: 85.9 fL (ref 78.0–100.0)
PLATELETS: 198 10*3/uL (ref 150–400)
RBC: 5.6 MIL/uL (ref 4.22–5.81)
RDW: 13.2 % (ref 11.5–15.5)
WBC: 10.3 10*3/uL (ref 4.0–10.5)

## 2013-10-31 LAB — PRO B NATRIURETIC PEPTIDE: Pro B Natriuretic peptide (BNP): 359.3 pg/mL — ABNORMAL HIGH (ref 0–125)

## 2013-10-31 LAB — TROPONIN I

## 2013-10-31 MED ORDER — PREDNISONE 20 MG PO TABS: 60.0000 mg | ORAL_TABLET | Freq: Every day | ORAL | Status: DC

## 2013-10-31 MED ORDER — ALBUTEROL SULFATE (2.5 MG/3ML) 0.083% IN NEBU
5.0000 mg | INHALATION_SOLUTION | Freq: Once | RESPIRATORY_TRACT | Status: AC
  Administered 2013-10-31: 5 mg via RESPIRATORY_TRACT
  Filled 2013-10-31: qty 6

## 2013-10-31 MED ORDER — FENTANYL CITRATE 0.05 MG/ML IJ SOLN
50.0000 ug | INTRAMUSCULAR | Status: DC | PRN
  Administered 2013-10-31: 50 ug via INTRAVENOUS
  Filled 2013-10-31: qty 2

## 2013-10-31 MED ORDER — ALBUTEROL SULFATE HFA 108 (90 BASE) MCG/ACT IN AERS: 1.0000 | INHALATION_SPRAY | Freq: Four times a day (QID) | RESPIRATORY_TRACT | Status: DC | PRN

## 2013-10-31 MED ORDER — METHYLPREDNISOLONE SODIUM SUCC 125 MG IJ SOLR
125.0000 mg | Freq: Once | INTRAMUSCULAR | Status: AC
  Administered 2013-10-31: 125 mg via INTRAVENOUS
  Filled 2013-10-31: qty 2

## 2013-10-31 NOTE — Discharge Instructions (Signed)
Chronic Obstructive Pulmonary Disease  Chronic obstructive pulmonary disease (COPD) is a common lung condition in which airflow from the lungs is limited. COPD is a general term that can be used to describe many different lung problems that limit airflow, including both chronic bronchitis and emphysema.  If you have COPD, your lung function will probably never return to normal, but there are measures you can take to improve lung function and make yourself feel better.   CAUSES   · Smoking (common).    · Exposure to secondhand smoke.    · Genetic problems.  · Chronic inflammatory lung diseases or recurrent infections.  SYMPTOMS   · Shortness of breath, especially with physical activity.    · Deep, persistent (chronic) cough with a large amount of thick mucus.    · Wheezing.    · Rapid breaths (tachypnea).    · Gray or bluish discoloration (cyanosis) of the skin, especially in fingers, toes, or lips.    · Fatigue.    · Weight loss.    · Frequent infections or episodes when breathing symptoms become much worse (exacerbations).    · Chest tightness.  DIAGNOSIS   Your healthcare provider will take a medical history and perform a physical examination to make the initial diagnosis.  Additional tests for COPD may include:   · Lung (pulmonary) function tests.  · Chest X-ray.  · CT scan.  · Blood tests.  TREATMENT   Treatment available to help you feel better when you have COPD include:   · Inhaler and nebulizer medicines. These help manage the symptoms of COPD and make your breathing more comfortable  · Supplemental oxygen. Supplemental oxygen is only helpful if you have a low oxygen level in your blood.    · Exercise and physical activity. These are beneficial for nearly all people with COPD. Some people may also benefit from a pulmonary rehabilitation program.  HOME CARE INSTRUCTIONS   · Take all medicines (inhaled or pills) as directed by your health care provider.  · Only take over-the-counter or prescription medicines  for pain, fever, or discomfort as directed by your health care provider.    · Avoid over-the-counter medicines or cough syrups that dry up your airway (such as antihistamines) and slow down the elimination of secretions unless instructed otherwise by your healthcare provider.    · If you are a smoker, the most important thing that you can do is stop smoking. Continuing to smoke will cause further lung damage and breathing trouble. Ask your health care provider for help with quitting smoking. He or she can direct you to community resources or hospitals that provide support.  · Avoid exposure to irritants such as smoke, chemicals, and fumes that aggravate your breathing.  · Use oxygen therapy and pulmonary rehabilitation if directed by your health care provider. If you require home oxygen therapy, ask your healthcare provider whether you should purchase a pulse oximeter to measure your oxygen level at home.    · Avoid contact with individuals who have a contagious illness.  · Avoid extreme temperature and humidity changes.  · Eat healthy foods. Eating smaller, more frequent meals and resting before meals may help you maintain your strength.  · Stay active, but balance activity with periods of rest. Exercise and physical activity will help you maintain your ability to do things you want to do.  · Preventing infection and hospitalization is very important when you have COPD. Make sure to receive all the vaccines your health care provider recommends, especially the pneumococcal and influenza vaccines. Ask your healthcare provider whether you   need a pneumonia vaccine.  · Learn and use relaxation techniques to manage stress.  · Learn and use controlled breathing techniques as directed by your health care provider. Controlled breathing techniques include:    · Pursed lip breathing. Start by breathing in (inhaling) through your nose for 1 second. Then, purse your lips as if you were going to whistle and breathe out (exhale)  through the pursed lips for 2 seconds.    · Diaphragmatic breathing. Start by putting one hand on your abdomen just above your waist. Inhale slowly through your nose. The hand on your abdomen should move out. Then purse your lips and exhale slowly. You should be able to feel the hand on your abdomen moving in as you exhale.    · Learn and use controlled coughing to clear mucus from your lungs. Controlled coughing is a series of short, progressive coughs. The steps of controlled coughing are:    1. Lean your head slightly forward.    2. Breathe in deeply using diaphragmatic breathing.    3. Try to hold your breath for 3 seconds.    4. Keep your mouth slightly open while coughing twice.    5. Spit any mucus out into a tissue.    6. Rest and repeat the steps once or twice as needed.  SEEK MEDICAL CARE IF:   · You are coughing up more mucus than usual.    · There is a change in the color or thickness of your mucus.    · Your breathing is more labored than usual.    · Your breathing is faster than usual.    SEEK IMMEDIATE MEDICAL CARE IF:   · You have shortness of breath while you are resting.    · You have shortness of breath that prevents you from:  · Being able to talk.    · Performing your usual physical activities.    · You have chest pain lasting longer than 5 minutes.    · Your skin color is more cyanotic than usual.  · You measure low oxygen saturations for longer than 5 minutes with a pulse oximeter.  MAKE SURE YOU:   · Understand these instructions.  · Will watch your condition.  · Will get help right away if you are not doing well or get worse.  Document Released: 04/09/2005 Document Revised: 04/20/2013 Document Reviewed: 02/24/2013  [employer]® Patient Information ©2014 [employer].

## 2013-10-31 NOTE — ED Notes (Signed)
Ambulated pt around nurses station. Pt stated before we started he was going to drop O2 sats to around 85%. Pt did drop to 83%. Pt returned to room placed back on O2 & sats returned to 94%. EDP notified.

## 2013-10-31 NOTE — ED Notes (Signed)
Pt reports sob worsening over several days, denies chest pain but states he has pain in his back at times.  Pt still smokes but is trying to quit

## 2013-10-31 NOTE — ED Notes (Signed)
Pt alert & oriented x4, stable gait. Patient  given discharge instructions, paperwork & prescription(s). Patient verbalized understanding. Pt left department w/ no further questions. 

## 2013-10-31 NOTE — ED Provider Notes (Signed)
CSN: [id number]     Arrival date & time 10/31/13  0135 History   First MD Initiated Contact with Patient 10/31/13 0146     Chief Complaint  Patient presents with  . Shortness of Breath     (Consider location/radiation/quality/duration/timing/severity/associated sxs/prior Treatment) HPI History provided by patient. History of COPD, presents with runny nose and allergy symptoms for the last 3-4 days and now with increasing shortness of breath and wheezing despite using inhalers at home. He has ongoing cough without hemoptysis or productive sputum. No associated fevers or chills. No leg pain or leg swelling. He has some back discomfort with coughing denies any chest pain. Symptoms moderate severity. Unable to sleep tonight due to symptoms. He has a known cavitary lesion, was recently admitted to the hospital for COPD exacerbation. No known alleviating factors.   Past Medical History  Diagnosis Date  . COPD (chronic obstructive pulmonary disease)   . Hypercholesteremia   . History of pneumonia   . Polycythemia   . Coronary atherosclerosis of native coronary artery     Mild nonobstructive disease at catheterization 2007  . 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 Borderline diabetes   . Seizures     Last seizure 2 yrs ago  . GERD (gastroesophageal reflux disease)   . Type 2 diabetes mellitus   . DDD (degenerative disc disease)     Cervical and thoracic  . Chronic back pain   . Bronchitis   . Chronic left shoulder pain   . Chronic neck pain   . Atrial fibrillation     Not anticoagulated  . Smoker    Past Surgical History  Procedure Laterality Date  . Colonoscopy w/ endoscopic [place]    . Vasectomy    . Throat biopsy    . Lung biopsy    . Vasectomy  1987   Family History  Problem Relation Age of Onset  . Hypertension Mother   . Diabetes Mother   . Heart attack Mother   . Heart failure Mother   . Hypertension Sister   . Diabetes Sister   .  Heart failure Sister    History  Substance Use Topics  . Smoking status: Current Every Day Smoker -- 0.50 packs/day for 45 years    Types: Cigarettes  . Smokeless tobacco: Former Systems developer     Comment: started smoking half pack 2 days ago per stopped in the hospital as of 07-20-2013  . Alcohol Use: No    Review of Systems  Constitutional: Negative for fever and chills.  Respiratory: Positive for cough, shortness of breath and wheezing.   Cardiovascular: Negative for chest pain.  Gastrointestinal: Negative for abdominal pain.  Genitourinary: Negative for dysuria.  Musculoskeletal: Positive for back pain. Negative for neck pain and neck stiffness.  Skin: Negative for rash.  Neurological: Negative for headaches.  All other systems reviewed and are negative.     Allergies  Influenza vaccine live  Home Medications   Prior to Admission medications   Medication Sig Start Date End Date Taking? Authorizing Provider  albuterol (VENTOLIN HFA) 108 (90 BASE) MCG/ACT inhaler Inhale 2 puffs into the lungs every 4 (four) hours as needed. Shortness of breath   Yes Historical Provider, MD  aspirin EC 81 MG tablet Take 81 mg by mouth daily.   Yes Historical Provider, MD  budesonide-formoterol (SYMBICORT) 80-4.5 MCG/ACT inhaler Inhale 2 puffs into the lungs 2 (two) times daily. 10/21/13  Yes Bess Harvest  Olevia Bowens, MD  fluticasone [facility]) 50 MCG/ACT nasal spray Place 2 sprays into both nostrils daily.   Yes Historical Provider, MD  Fluticasone-Salmeterol (ADVAIR DISKUS) 250-50 MCG/DOSE AEPB Inhale 1 puff into the lungs every 12 (twelve) hours.    Yes Historical Provider, MD  levofloxacin (LEVAQUIN) 750 MG tablet Take 1 tablet (750 mg total) by mouth daily. 10/21/13  Yes Charlynne Cousins, MD  nitroGLYCERIN (NITROSTAT) 0.4 MG SL tablet Place 1 tablet (0.4 mg total) under the tongue every 5 (five) minutes as needed for chest pain. 04/22/13  Yes Lendon Colonel, NP  tiotropium (SPIRIVA) 18 MCG inhalation  capsule Place 18 mcg into inhaler and inhale daily.   Yes Historical Provider, MD  predniSONE (DELTASONE) 10 MG tablet Takes 6 tablets for 1 days, then 5 tablets for 1 days, then 4 tablets for 1 days, then 3 tablets for 1 days, then 2 tabs for 1 days, then 1 tab for 1 days, and then stop. 10/21/13   Charlynne Cousins, MD   BP 121/78  Pulse 96  Temp(Src) 98 F (36.7 C) (Oral)  Resp 28  Ht 5\' 8"  (1.727 m)  Wt 140 lb (63.504 kg)  BMI 21.29 kg/m2  SpO2 96% Physical Exam  Constitutional: He is oriented to person, place, and time. He appears well-developed and well-nourished.  HENT:  Head: Normocephalic and atraumatic.  Eyes: EOM are normal. Pupils are equal, round, and reactive to light.  Neck: Neck supple.  Cardiovascular: Normal rate, regular rhythm and intact distal pulses.   Pulmonary/Chest:  tachypnea. Bilateral expiratory wheezes.  Abdominal: Soft. There is no tenderness.  Musculoskeletal: Normal range of motion.  No calf tenderness. No lower extremity edema.  Neurological: He is alert and oriented to person, place, and time.  Skin: Skin is warm and dry. No rash noted.    ED Course  Procedures (including critical care time) Labs Review Labs Reviewed  BASIC METABOLIC PANEL - Abnormal; Notable for the following:    Sodium 133 (*)    Glucose, Bld 118 (*)    Calcium 8.3 (*)    GFR calc non Af Amer 77 (*)    GFR calc Af Amer 89 (*)    All other components within normal limits  PRO B NATRIURETIC PEPTIDE - Abnormal; Notable for the following:    Pro B Natriuretic peptide (BNP) 359.3 (*)    All other components within normal limits  CBC  TROPONIN I    Imaging Review Dg Chest 2 View  10/31/2013   CLINICAL DATA:  Shortness of breath worsening over several days. Back pain. Every day smoker.  EXAM: CHEST  2 VIEW  COMPARISON:  CT ANGIO CHEST W/CM &/OR WO/CM dated 10/20/2013; DG CHEST 2 VIEW dated 10/20/2013  FINDINGS: Normal heart size and pulmonary vascularity. Emphysematous changes  in the lungs with fibrosis in the periphery and bases. Prominence scarring in the left upper lung with retraction left hilum. This corresponds to a cavitary mass on the recent CT. No significant change in appearance since prior studies.  IMPRESSION: Stable appearance of the chest since previous studies. No acute infiltration. Chronic emphysema, fibrosis, and cavitary lesions in the left upper lung with left hilar retraction and scarring.   Electronically Signed   By: Lucienne Capers M.D.   On: 10/31/2013 02:48     EKG Interpretation   Date/Time:  Monday October 31 2013 01:43:10 EDT Ventricular Rate:  98 PR Interval:  112 QRS Duration: 76 QT Interval:  326 QTC Calculation: 416  R Axis:   48 Text Interpretation:  Normal sinus rhythm Nonspecific ST abnormality  Confirmed by Ilene Witcher  MD, Marquett Bertoli ([id number]) on 10/31/2013 2:11:39 AM     IV steroids. Albuterol breathing treatment. IV fentanyl for back pain.  On recheck is improving. No longer tachypneic. Albuterol repeated.  4:16 AM back pain has resolved. Subjectively feeling much better. Still has some minimal wheezes but moving air significantly better. Patient is requesting to be discharged home. He ambulates in the ED with some dyspnea. He has oxygen at home as needed. He agrees to strict return precautions. Prescription for prednisone and albuterol provided.  MDM   Final diagnoses:  COPD exacerbation   History of COPD presents with dyspnea, cough and bilateral wheezes. Borderline pulse ox. Improved with oxygen. Medications provided including breathing treatments. Chest x-ray labs obtained and reviewed as above. EKG does not show any acute abnormality. Condition improved and patient prefers to be discharged home. Previous EMR admission records, vital signs per nurse's notes reviewed and considered.    Teressa Lower, MD 10/31/13 [phone]

## 2013-11-02 DIAGNOSIS — J449 Chronic obstructive pulmonary disease, unspecified: Secondary | ICD-10-CM | POA: Diagnosis not present

## 2013-11-16 IMAGING — CR DG CHEST 2V
3 series · 3 of 3 positions shown · non-contrast
Comparison: Prior CT scan of the chest 06/26/2012; prior chest x-
ray also dated 06/26/2012

CLINICAL DATA: Fall, near-syncope

CHEST - 2 VIEW

[view not recorded (1 of 3)]
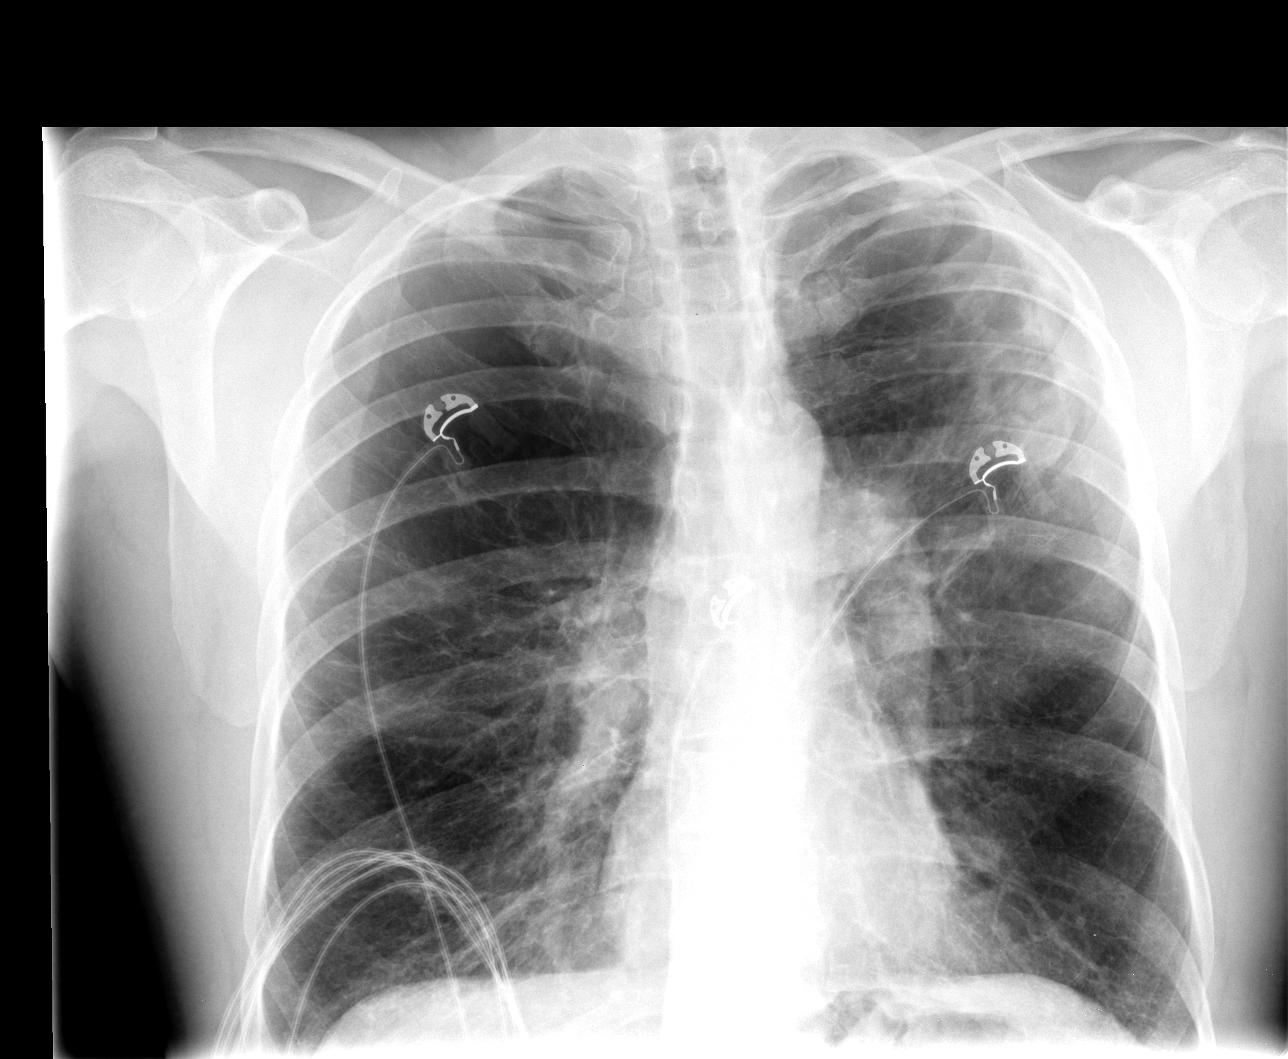

[view not recorded (2 of 3)]
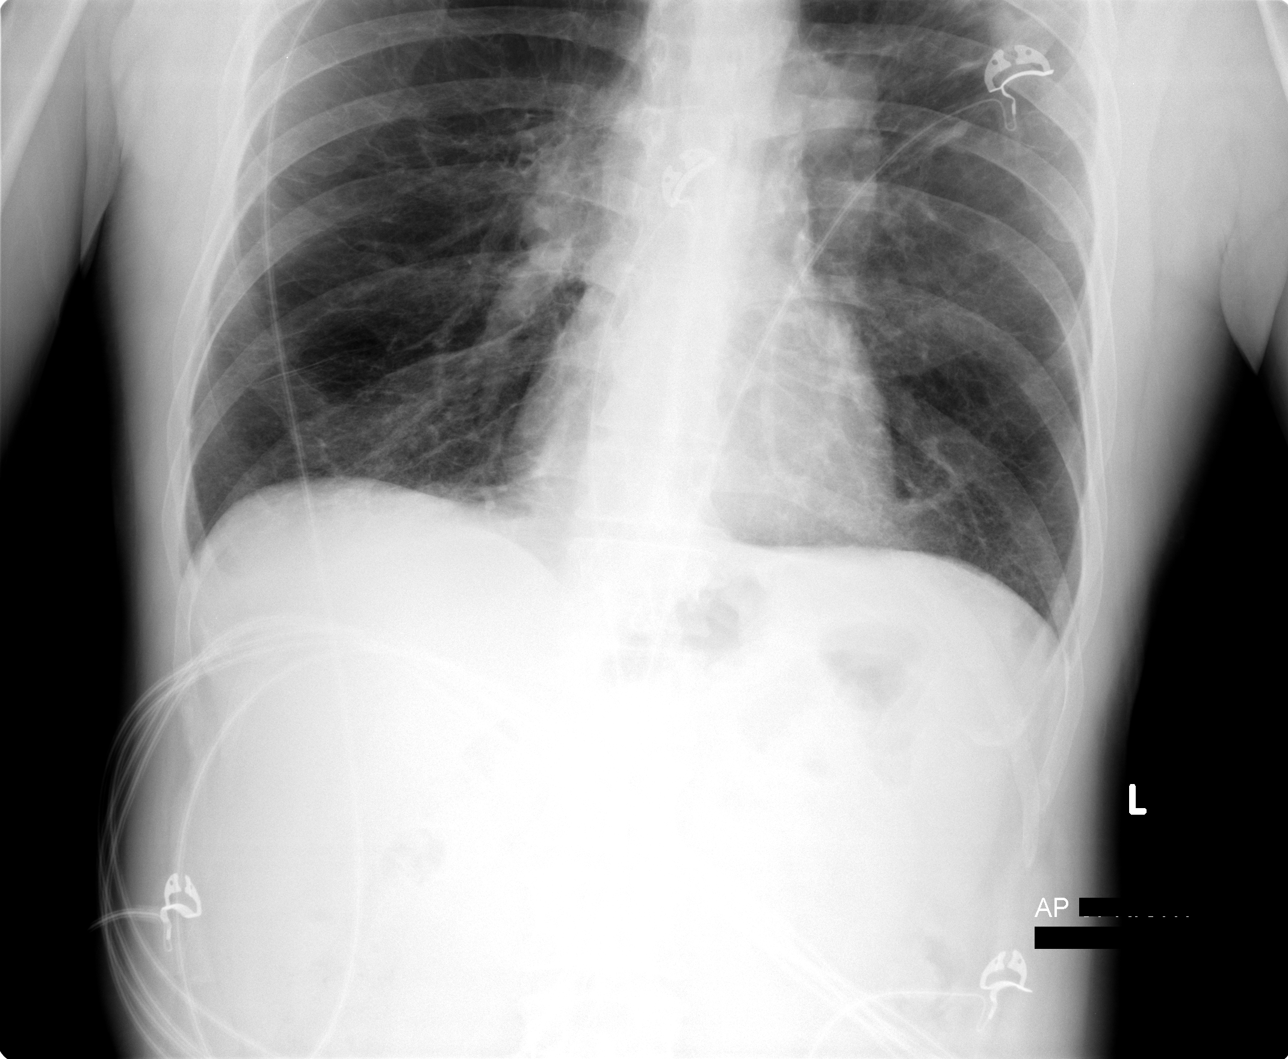

[view not recorded (3 of 3)]
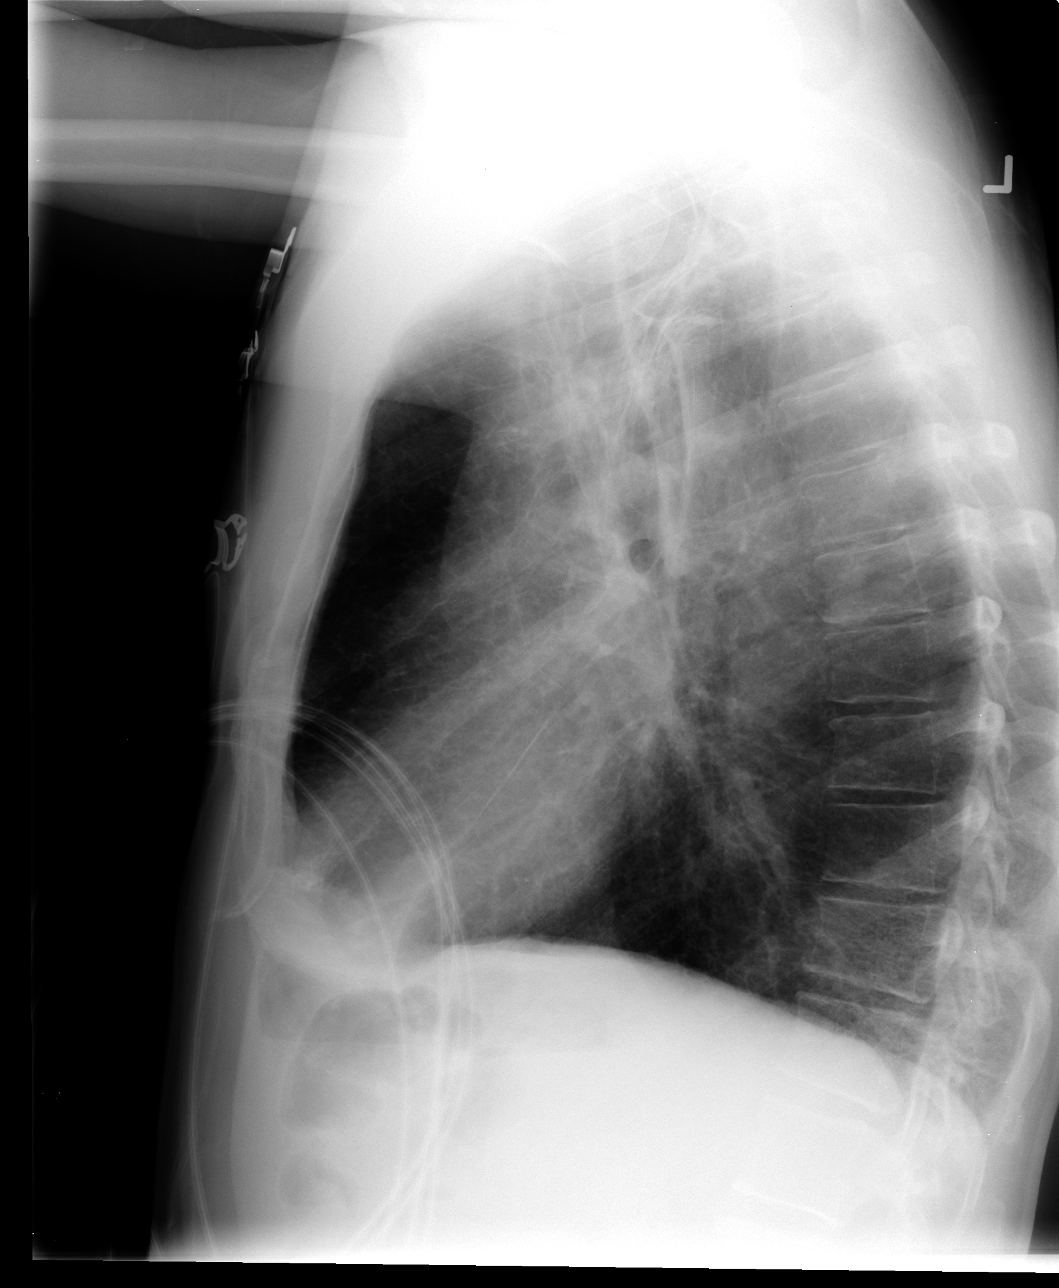

[3 of 3 positions shown; findings below may reference images not displayed]

FINDINGS: Stable appearance of cavitary mass in the periphery of
the left upper lobe.  Background changes of COPD and severe
emphysema are similar to prior.  Cardiac and mediastinal contours
are unchanged.  No new airspace consolidation, pneumothorax or
pleural effusion.  No acute osseous abnormality.
IMPRESSION: 1.  No acute cardiopulmonary disease.
2.  Stable cavitary mass in the periphery of the left upper lobe
3.  Background changes of severe COPD/emphysema are unchanged.

## 2013-11-16 IMAGING — CT CT HEAD W/O CM
4 series · 16 of 47 positions shown, 18 images · non-contrast
Comparison: Prior PET CT 06/06/2011

CT HEAD

CLINICAL DATA: Fall, near-syncope, neck pain

CT HEAD WITHOUT CONTRAST
CT CERVICAL SPINE WITHOUT CONTRAST
TECHNIQUE: Multidetector CT imaging of the head and cervical spine
was performed following the standard protocol without intravenous
contrast.  Multiplanar CT image reconstructions of the cervical
spine were also generated.

[Series 2: headseq 4.8 h37s · axial · 0.44mm/px · z∈[+265,+390]mm · 6 of 36 slices shown, 8 images]
[im 6/36  brain]
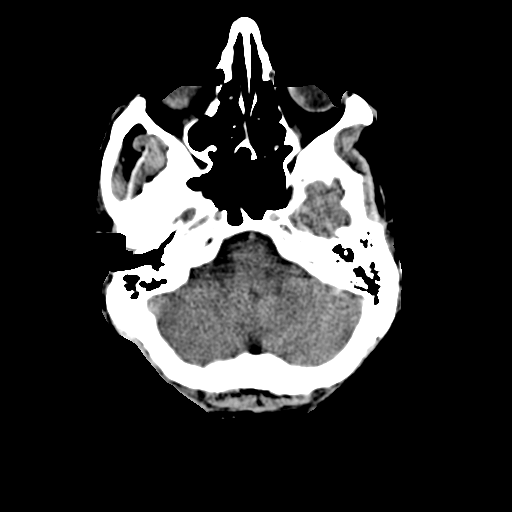
[im 6/36  bone]
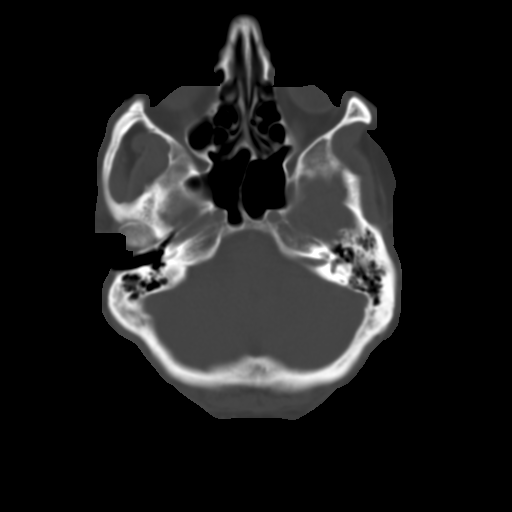
[im 11/36  brain]
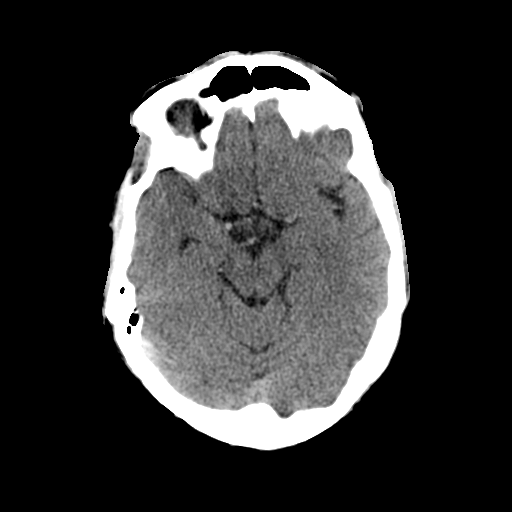
[im 16/36  brain]
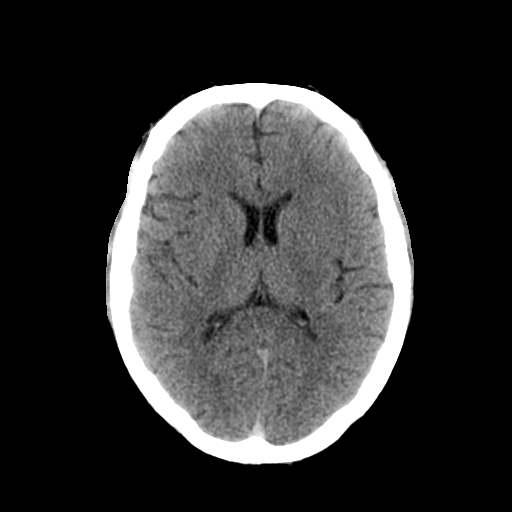
[im 21/36  brain]
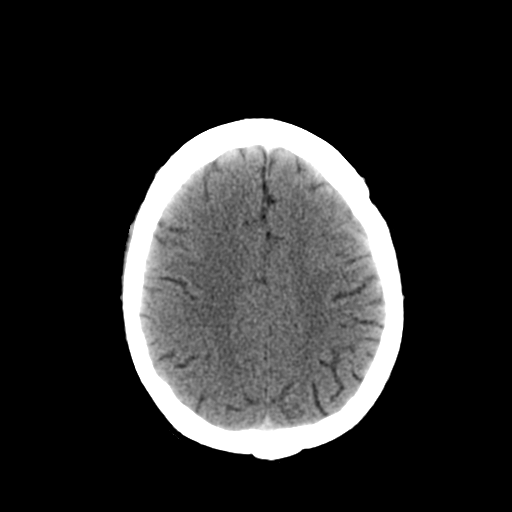
[im 26/36  brain]
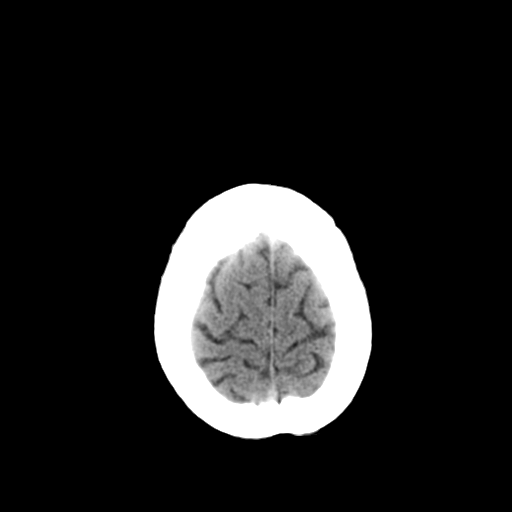
[im 26/36  bone]
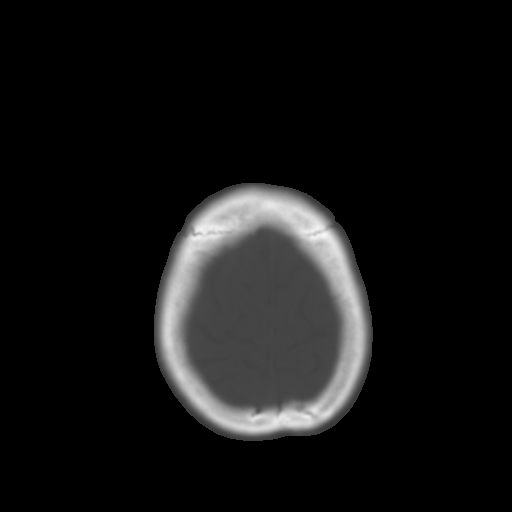
[im 31/36  brain]
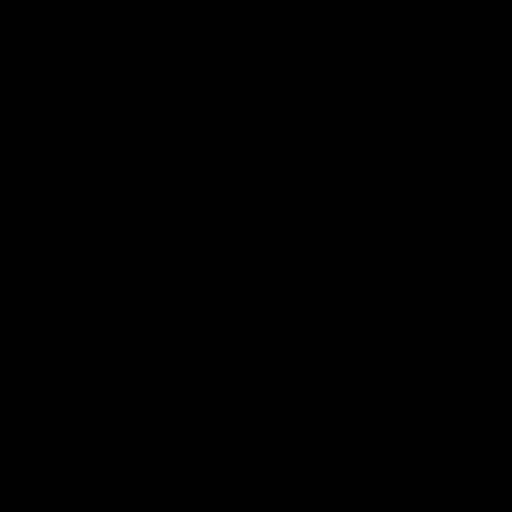

[Series 5: cervical st 2.0 b31s · axial · 0.29mm/px · z∈[+82,+144]mm · 4 of 94 slices shown]
[im 9/94  brain]
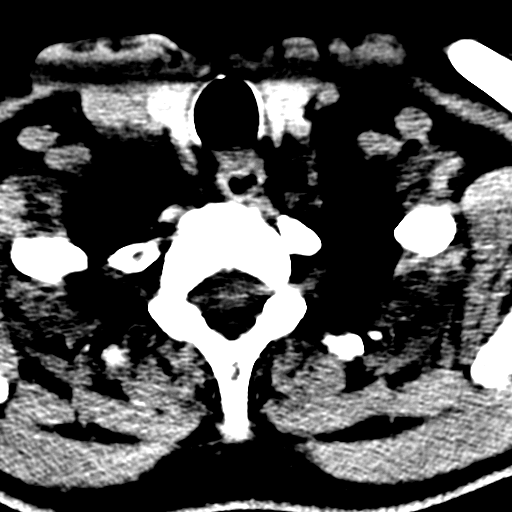
[im 18/94  brain]
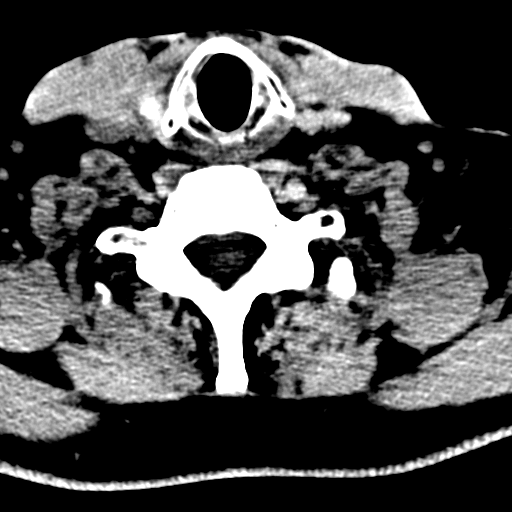
[im 32/94  brain]
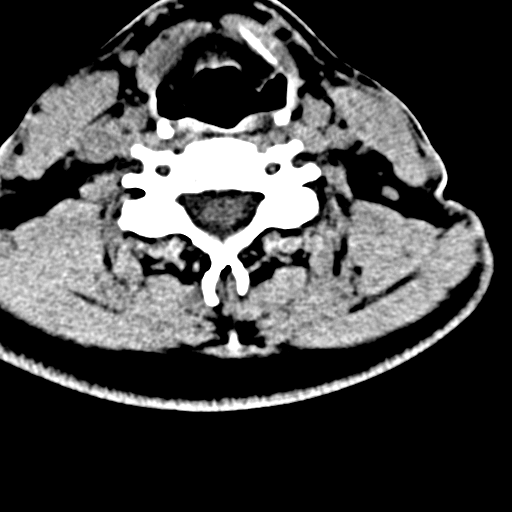
[im 40/94  brain]
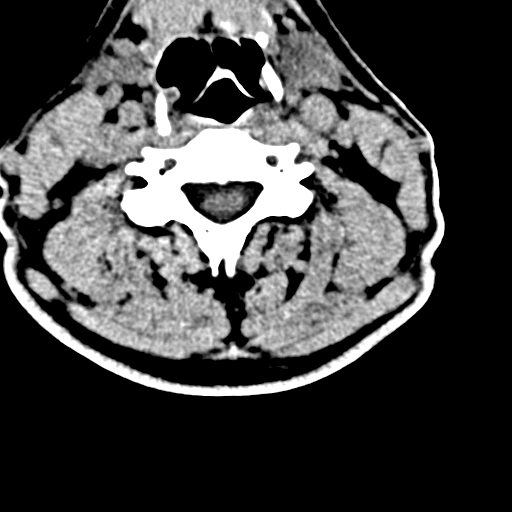

[Series 7: sagittal bone 2.0 · sagittal · 0.30mm/px · 3 of 60 slices shown]
[im 20/60  brain]
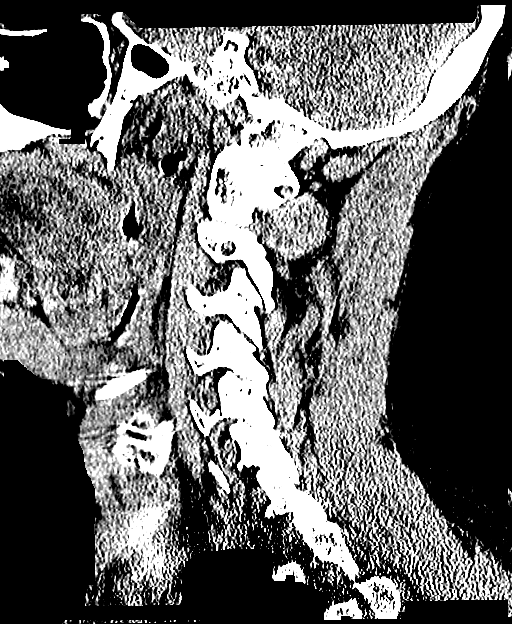
[im 30/60  brain]
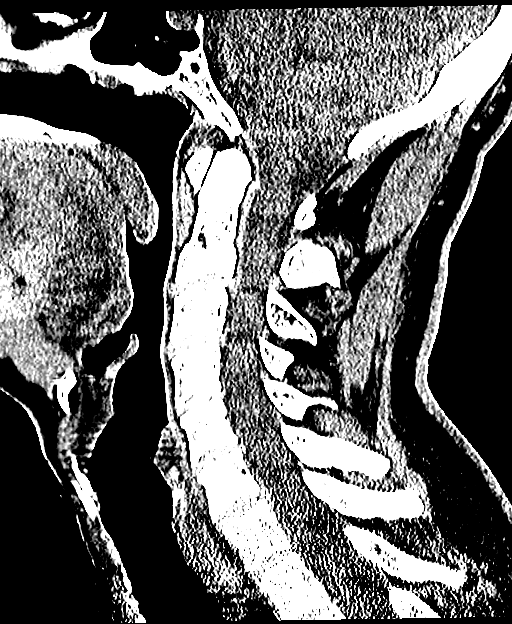
[im 40/60  brain]
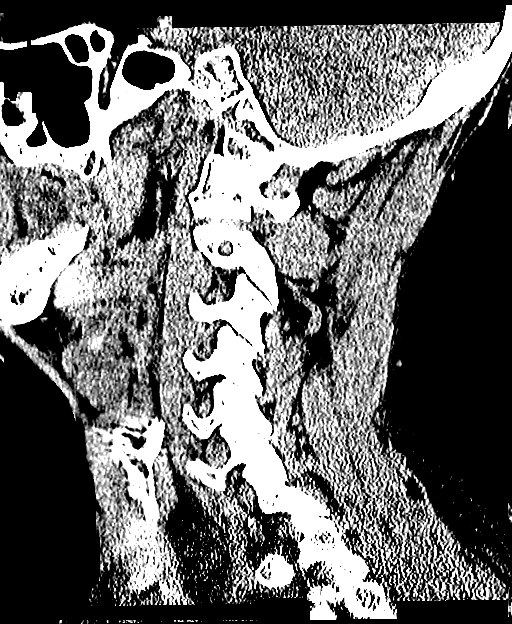

[Series 8: coronal bone 2.0 · coronal · 0.32mm/px · 3 of 71 slices shown]
[im 24/71  brain]
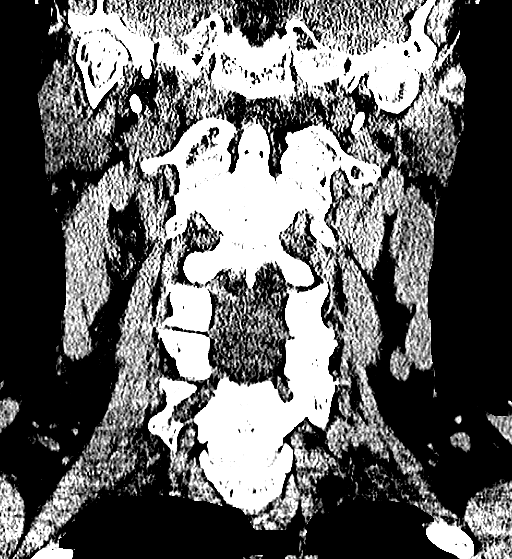
[im 32/71  brain]
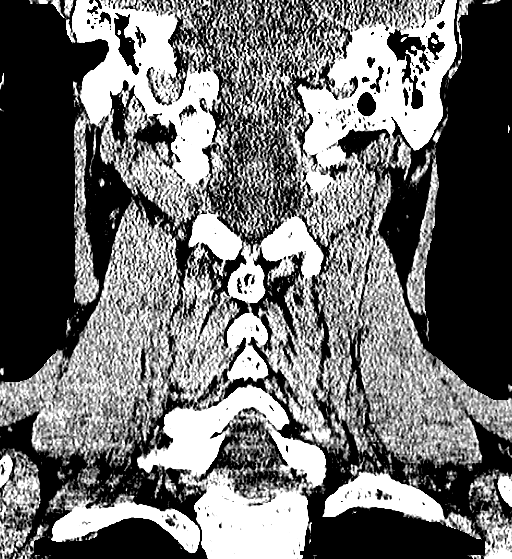
[im 39/71  brain]
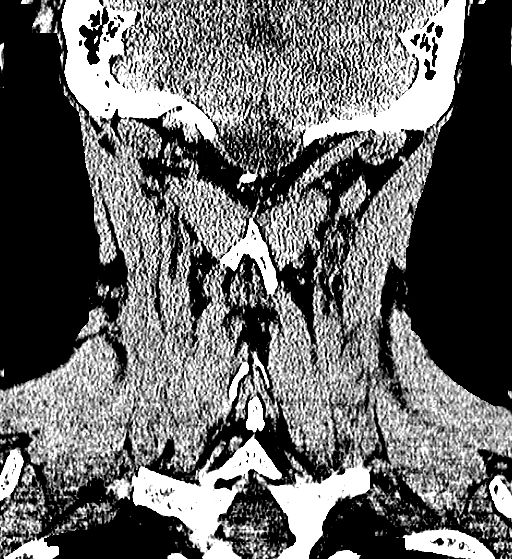

[16 of 47 positions shown; findings below may reference images not displayed]

FINDINGS: No acute intracranial hemorrhage, acute infarction, mass
lesion, mass effect, hydrocephalus or midline shift.  No focal soft
tissue swelling or significant scalp hematoma.  Unremarkable globes
and orbits.  Normal aeration of the mastoid air cells and paranasal
sinuses.  No focal calvarial abnormality.
IMPRESSION: No acute intracranial abnormality.  Normal appearance of the brain.

CT CERVICAL SPINE
FINDINGS: This no acute fracture, malalignment or prevertebral soft
tissue swelling.  Very mild multilevel spondylitic changes without
focality.  Normal anatomic alignment.  Severe biapical pulmonary
emphysema.
IMPRESSION: 1.  No acute fracture or malalignment.
2.  Severe biapical emphysema.

## 2013-11-17 ENCOUNTER — Telehealth: Payer: Self-pay

## 2013-11-17 DIAGNOSIS — M2040 Other hammer toe(s) (acquired), unspecified foot: Secondary | ICD-10-CM | POA: Diagnosis not present

## 2013-11-17 DIAGNOSIS — E119 Type 2 diabetes mellitus without complications: Secondary | ICD-10-CM | POA: Diagnosis not present

## 2013-11-17 DIAGNOSIS — M79609 Pain in unspecified limb: Secondary | ICD-10-CM | POA: Diagnosis not present

## 2013-11-17 DIAGNOSIS — G5750 Tarsal tunnel syndrome, unspecified lower limb: Secondary | ICD-10-CM | POA: Diagnosis not present

## 2013-11-17 DIAGNOSIS — IMO0002 Reserved for concepts with insufficient information to code with codable children: Secondary | ICD-10-CM | POA: Diagnosis not present

## 2013-11-17 DIAGNOSIS — M722 Plantar fascial fibromatosis: Secondary | ICD-10-CM | POA: Diagnosis not present

## 2013-11-17 DIAGNOSIS — G609 Hereditary and idiopathic neuropathy, unspecified: Secondary | ICD-10-CM | POA: Diagnosis not present

## 2013-11-17 NOTE — Telephone Encounter (Signed)
Patient called complaining of leg pain keeping him up at night.  Left message advising patient to contact PCP.  He is not a patient in our office at this time.

## 2013-11-24 DIAGNOSIS — M7150 Other bursitis, not elsewhere classified, unspecified site: Secondary | ICD-10-CM | POA: Diagnosis not present

## 2013-11-24 DIAGNOSIS — IMO0002 Reserved for concepts with insufficient information to code with codable children: Secondary | ICD-10-CM | POA: Diagnosis not present

## 2013-11-24 DIAGNOSIS — G5750 Tarsal tunnel syndrome, unspecified lower limb: Secondary | ICD-10-CM | POA: Diagnosis not present

## 2013-11-25 IMAGING — CT CT CHEST EXAM
2 of 3 series · 15 of 36 positions shown, 18 images · non-contrast
Comparison: 06/26/2012 and plain film of 08/29/2012.

CLINICAL DATA: Follow up of left upper lobe cavitary mass.

CT CHEST LEM TARANTO WITHOUT CONTRAST
TECHNIQUE: Multidetector CT imaging of the chest using the standard
low-dose protocol without administration of intravenous contrast.

[Series 2: low (person_name)/nodule · axial · 0.70mm/px · z∈[-310,-10]mm · 12 of 72 slices shown, 15 images]
[im 6/72  mediastinal]
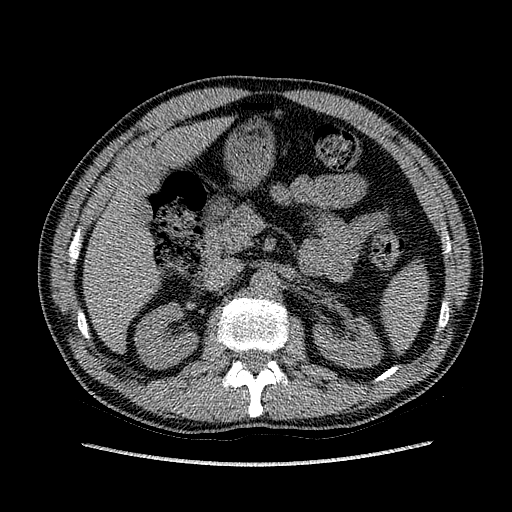
[im 6/72  lung]
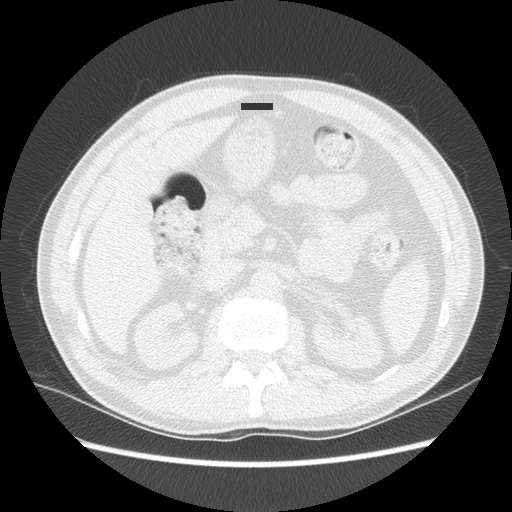
[im 11/72  lung]
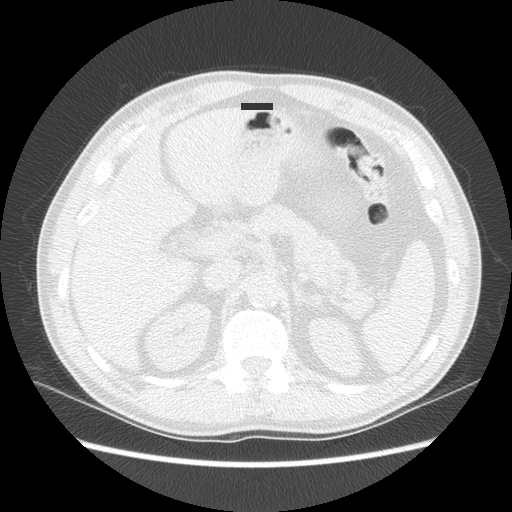
[im 16/72  lung]
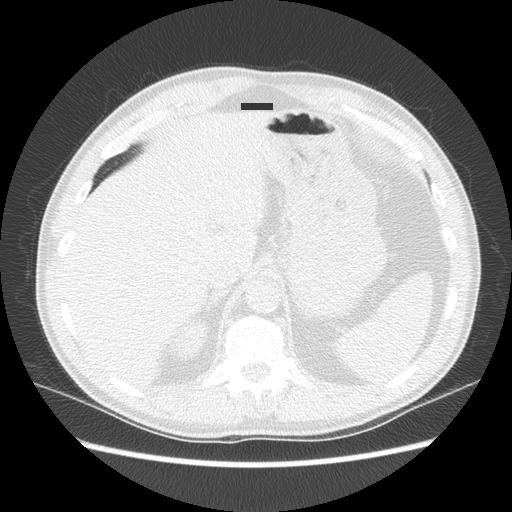
[im 22/72  lung]
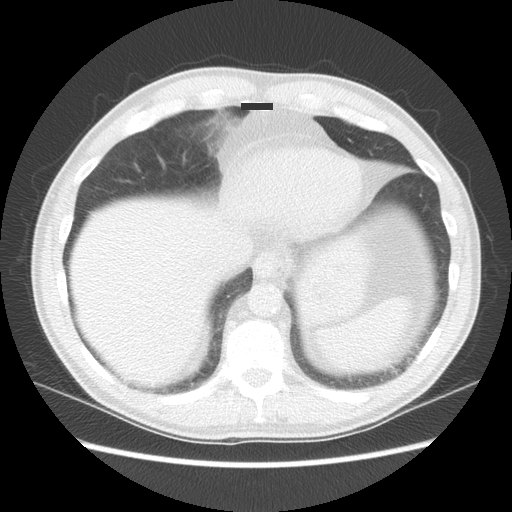
[im 27/72  mediastinal]
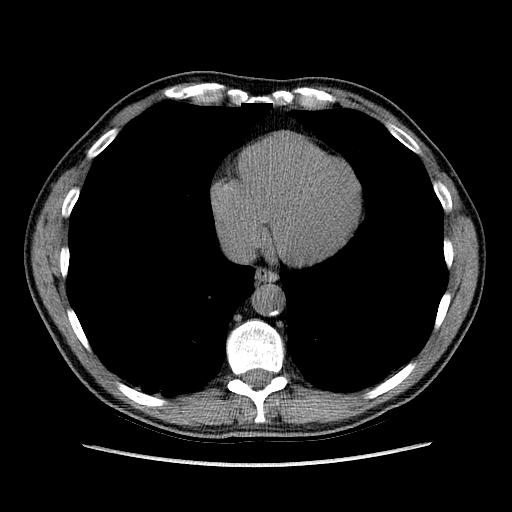
[im 27/72  lung]
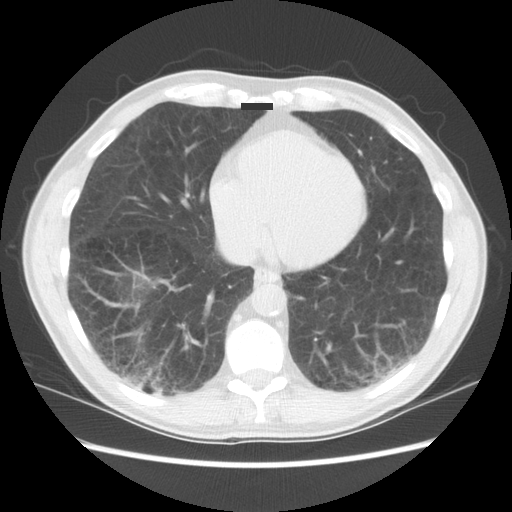
[im 32/72  lung]
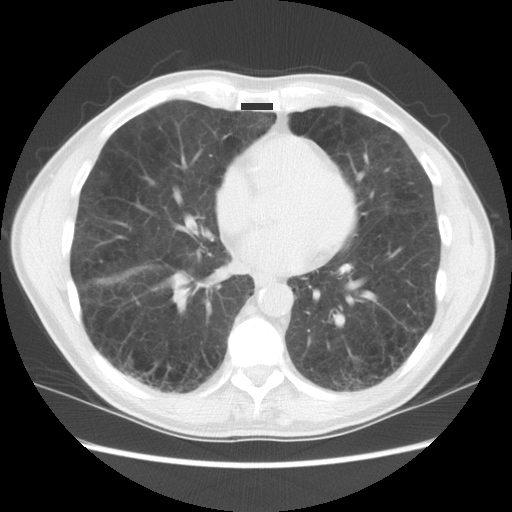
[im 40/72  lung]
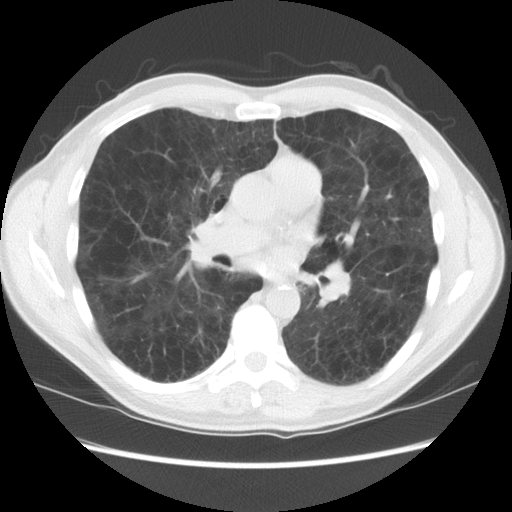
[im 45/72  lung]
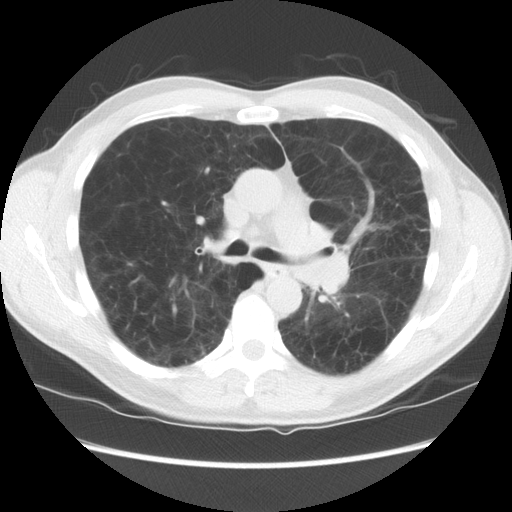
[im 50/72  mediastinal]
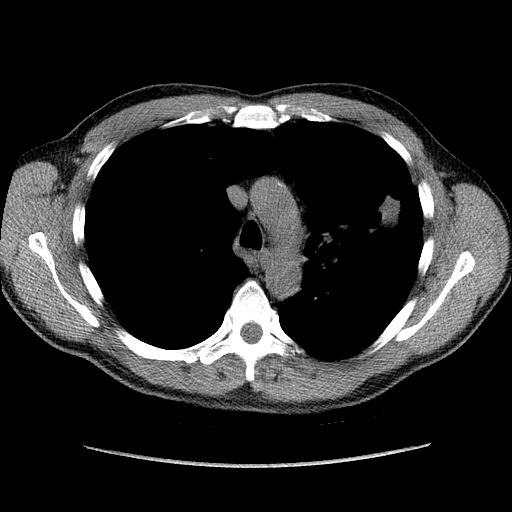
[im 50/72  lung]
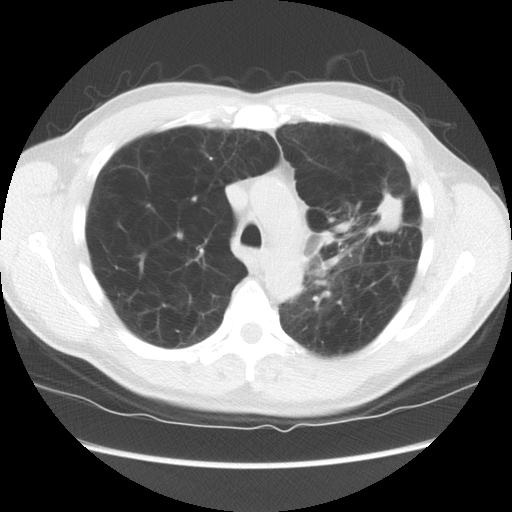
[im 56/72  lung]
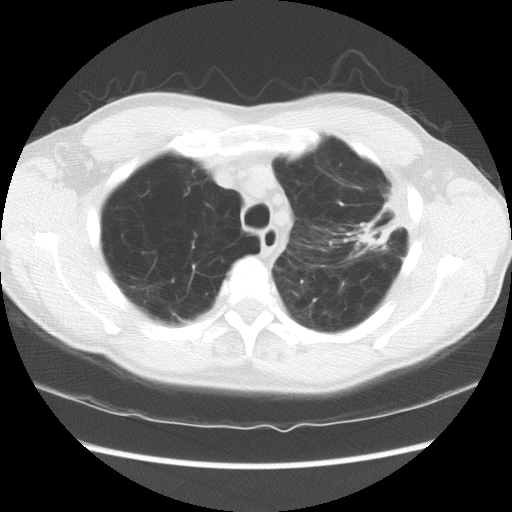
[im 61/72  lung]
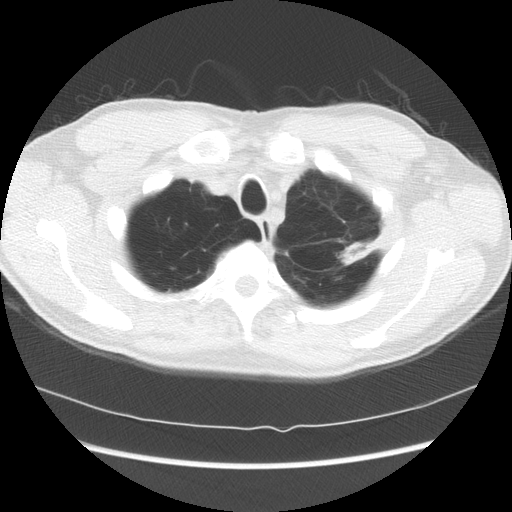
[im 66/72  lung]
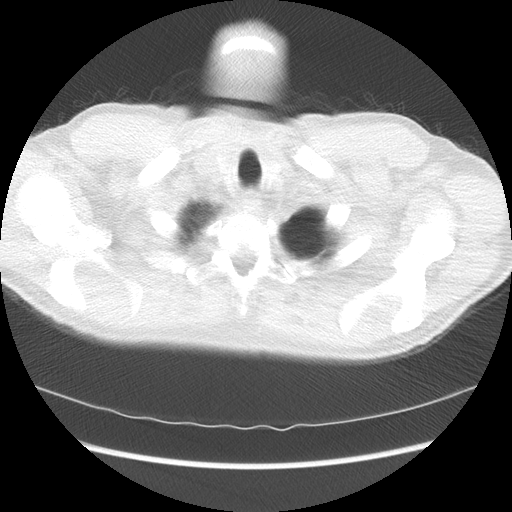

[Series 400: cor · coronal · 0.70mm/px · 3 of 108 slices shown]
[im 22/108  lung]
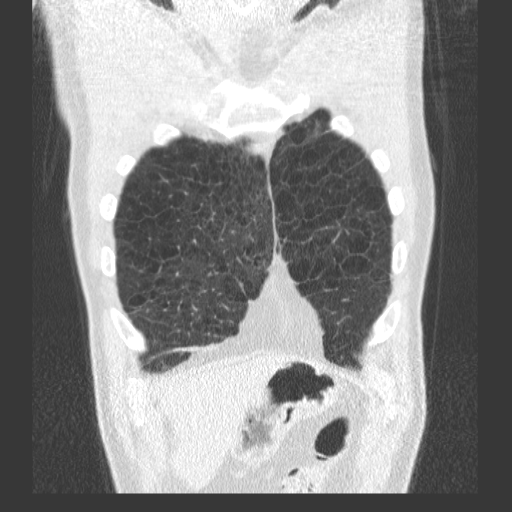
[im 43/108  lung]
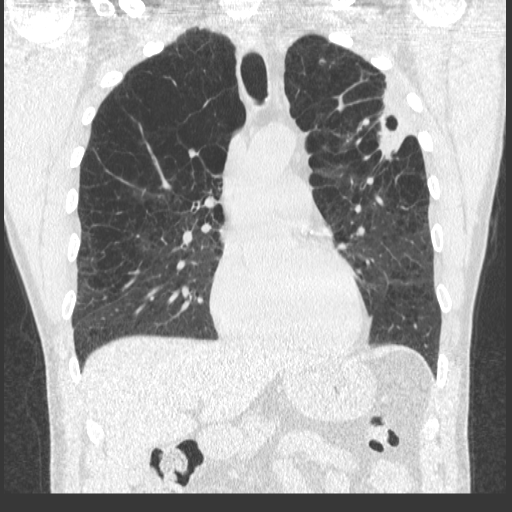
[im 65/108  lung]
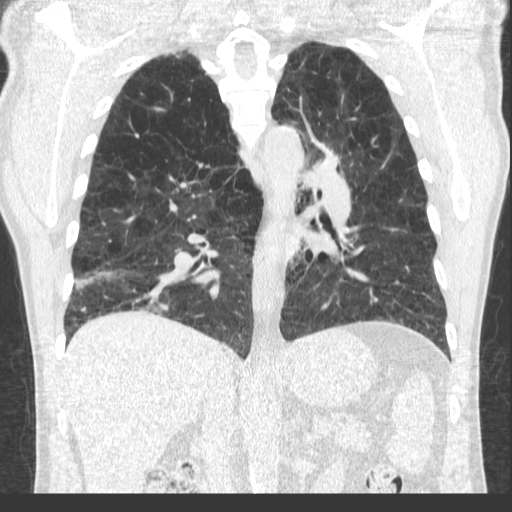

[15 of 36 positions shown; findings below may reference images not displayed]

FINDINGS: Lungs/pleura: Severe centrilobular emphysema.  Posterior
left upper lobe irregularly walled cavitary lesion appears similar.
Measures 4.3 x 2.2 cm on image 73/series 3 versus 4.1 x 2.2 cm at
the same level on the prior.  In greatest cranial caudal dimension,
5.4 cm on image 110/series 401 sagittal, similar.

A lingular 7 mm nodule on image 166/series 3 was likely present on
the prior exam (image 38/series 4).  Better visualized today
secondary to slice thickness.

Similar left-sided pleural fluid/thickening adjacent to the left
upper lobe cavitary process.

Heart/Mediastinum: Likely similar small axillary nodes.  Normal
heart size.  Coronary artery atherosclerosis which is age advanced.
No pericardial effusion.  Similar small mediastinal nodes.  Hilar
regions poorly evaluated secondary to low dose technique and lack
of IV contrast.  There is likely similar borderline bilateral hilar
adenopathy.

Upper abdomen: No significant findings.  No acute osseous
abnormality.

Bones/Musculoskeletal:  No acute osseous abnormality.
IMPRESSION: 1.  Similar size of the left upper lobe cavitary mass.
2.  Degraded exam, secondary to low dose technique.  On follow-up,
recommend routine, full dose CTs.
3.  Thoracic lymph nodes are likely similar but poorly evaluated
due to low dose technique.
4.  A lingular nodule is present on the prior exam but better
visualized today.  Also present back to 09/02/2011. Recommend
attention on follow-up.

## 2013-12-09 DIAGNOSIS — IMO0002 Reserved for concepts with insufficient information to code with codable children: Secondary | ICD-10-CM | POA: Diagnosis not present

## 2013-12-14 DIAGNOSIS — J984 Other disorders of lung: Secondary | ICD-10-CM | POA: Diagnosis not present

## 2013-12-15 ENCOUNTER — Ambulatory Visit (HOSPITAL_COMMUNITY)
Admission: RE | Admit: 2013-12-15 | Discharge: 2013-12-15 | Disposition: A | Discharge status: Home / Self Care | Payer: Medicare Other | Source: Ambulatory Visit | Attending: Pulmonary Disease | Admitting: Pulmonary Disease

## 2013-12-15 ENCOUNTER — Other Ambulatory Visit (HOSPITAL_COMMUNITY): Payer: Self-pay | Admitting: Pulmonary Disease

## 2013-12-15 DIAGNOSIS — J984 Other disorders of lung: Secondary | ICD-10-CM | POA: Insufficient documentation

## 2013-12-15 DIAGNOSIS — J811 Chronic pulmonary edema: Secondary | ICD-10-CM | POA: Diagnosis not present

## 2013-12-15 DIAGNOSIS — R0602 Shortness of breath: Secondary | ICD-10-CM | POA: Insufficient documentation

## 2013-12-26 ENCOUNTER — Telehealth: Payer: Self-pay | Admitting: *Deleted

## 2013-12-26 DIAGNOSIS — M79609 Pain in unspecified limb: Secondary | ICD-10-CM

## 2013-12-26 NOTE — Telephone Encounter (Signed)
Called pt back and he had stated that he was seen by [facility] and they had done test to see if he had diabetic nerve pain and he doesn't, they referred him to [facility] neurology but they can not do his referral because it had to come form him PCP. Pt states that he is having leg pain and shooting down to his knee cap.to his feet. ?ok to place order for neurology.

## 2013-12-26 NOTE — Telephone Encounter (Signed)
Message copied by Maureen Chatters on Mon Dec 26, 2013  3:16 PM ------      Message from: Devoria Glassing      Created: Mon Dec 26, 2013  2:29 PM       Patient would like referral to [facility] and needs a referral if possible please call [phone] ------

## 2013-12-26 NOTE — Telephone Encounter (Signed)
Approved.  

## 2013-12-26 NOTE — Telephone Encounter (Signed)
Referral initiated

## 2013-12-28 ENCOUNTER — Encounter: Payer: Self-pay | Admitting: Neurology

## 2013-12-28 ENCOUNTER — Ambulatory Visit (INDEPENDENT_AMBULATORY_CARE_PROVIDER_SITE_OTHER): Payer: Medicare Other | Admitting: Neurology

## 2013-12-28 VITALS — BP 107/70 | HR 97 | Resp 16 | Ht 67.25 in | Wt 140.0 lb

## 2013-12-28 DIAGNOSIS — I2510 Atherosclerotic heart disease of native coronary artery without angina pectoris: Secondary | ICD-10-CM | POA: Diagnosis not present

## 2013-12-28 DIAGNOSIS — M62838 Other muscle spasm: Secondary | ICD-10-CM

## 2013-12-28 DIAGNOSIS — M25569 Pain in unspecified knee: Secondary | ICD-10-CM | POA: Diagnosis not present

## 2013-12-28 DIAGNOSIS — M5380 Other specified dorsopathies, site unspecified: Secondary | ICD-10-CM

## 2013-12-28 DIAGNOSIS — I7090 Unspecified atherosclerosis: Secondary | ICD-10-CM | POA: Diagnosis not present

## 2013-12-28 DIAGNOSIS — M549 Dorsalgia, unspecified: Secondary | ICD-10-CM | POA: Diagnosis not present

## 2013-12-28 DIAGNOSIS — M62830 Muscle spasm of back: Secondary | ICD-10-CM

## 2013-12-28 MED ORDER — GABAPENTIN 300 MG PO CAPS: 300.0000 mg | ORAL_CAPSULE | Freq: Three times a day (TID) | ORAL | Status: DC

## 2013-12-28 NOTE — Patient Instructions (Signed)
Smoking Cessation, Tips for Success If you are ready to quit smoking, congratulations! You have chosen to help yourself be healthier. Cigarettes bring nicotine, tar, carbon monoxide, and other irritants into your body. Your lungs, heart, and blood vessels will be able to work better without these poisons. There are many different ways to quit smoking. Nicotine gum, nicotine patches, a nicotine inhaler, or nicotine nasal spray can help with physical craving. Hypnosis, support groups, and medicines help break the habit of smoking. WHAT THINGS CAN I DO TO MAKE QUITTING EASIER?  Here are some tips to help you quit for good:  Pick a date when you will quit smoking completely. Tell all of your friends and family about your plan to quit on that date.  Do not try to slowly cut down on the number of cigarettes you are smoking. Pick a quit date and quit smoking completely starting on that day.  Throw away all cigarettes.   Clean and remove all ashtrays from your home, work, and car.   On a card, write down your reasons for quitting. Carry the card with you and read it when you get the urge to smoke.   Cleanse your body of nicotine. Drink enough water and fluids to keep your urine clear or pale yellow. Do this after quitting to flush the nicotine from your body.   Learn to predict your moods. Do not let a bad situation be your excuse to have a cigarette. Some situations in your life might tempt you into wanting a cigarette.   Never have "just one" cigarette. It leads to wanting another and another. Remind yourself of your decision to quit.   Change habits associated with smoking. If you smoked while driving or when feeling stressed, try other activities to replace smoking. Stand up when drinking your coffee. Brush your teeth after eating. Sit in a different chair when you read the paper. Avoid alcohol while trying to quit, and try to drink fewer caffeinated beverages. Alcohol and caffeine may urge  you to smoke.   Avoid foods and drinks that can trigger a desire to smoke, such as sugary or spicy foods and alcohol.   Ask people who smoke not to smoke around you.   Have something planned to do right after eating or having a cup of coffee. For example, plan to take a walk or exercise.   Try a relaxation exercise to calm you down and decrease your stress. Remember, you may be tense and nervous for the first 2 weeks after you quit, but this will pass.   Find new activities to keep your hands busy. Play with a pen, coin, or rubber band. Doodle or draw things on paper.   Brush your teeth right after eating. This will help cut down on the craving for the taste of tobacco after meals. You can also try mouthwash.   Use oral substitutes in place of cigarettes. Try using lemon drops, carrots, cinnamon sticks, or chewing gum. Keep them handy so they are available when you have the urge to smoke.   When you have the urge to smoke, try deep breathing.   Designate your home as a nonsmoking area.   If you are a heavy smoker, ask your health care provider about a prescription for nicotine chewing gum. It can ease your withdrawal from nicotine.   Reward yourself. Set aside the cigarette [place] you save and buy yourself something nice.   Look for support from others. Join a support group or   smoking cessation program. Ask someone at home or at work to help you with your plan to quit smoking.   Always ask yourself, "Do I need this cigarette or is this just a reflex?" Tell yourself, "Today, I choose not to smoke," or "I do not want to smoke." You are reminding yourself of your decision to quit.  Do not replace cigarette smoking with electronic cigarettes (commonly called e-cigarettes). The safety of e-cigarettes is unknown, and some may contain harmful chemicals.  If you relapse, do not give up! Plan ahead and think about what you will do the next time you get the urge to smoke.  HOW WILL  I FEEL WHEN I QUIT SMOKING? You may have symptoms of withdrawal because your body is used to nicotine (the addictive substance in cigarettes). You may crave cigarettes, be irritable, feel very hungry, cough often, get headaches, or have difficulty concentrating. The withdrawal symptoms are only temporary. They are strongest when you first quit but will go away within 10-14 days. When withdrawal symptoms occur, stay in control. Think about your reasons for quitting. Remind yourself that these are signs that your body is healing and getting used to being without cigarettes. Remember that withdrawal symptoms are easier to treat than the major diseases that smoking can cause.  Even after the withdrawal is over, expect periodic urges to smoke. However, these cravings are generally short lived and will go away whether you smoke or not. Do not smoke!  WHAT RESOURCES ARE AVAILABLE TO HELP ME QUIT SMOKING? Your health care provider can direct you to community resources or hospitals for support, which may include:  Group support.  Education.  Hypnosis.  Therapy. Document Released: 03/28/2004 Document Revised: 04/20/2013 Document Reviewed: 12/16/2012 [facility] Patient Information 2015 [place], [place]. This information is not intended to replace advice given to you by your health care provider. Make sure you discuss any questions you have with your health care provider. Chronic Obstructive Pulmonary Disease Chronic obstructive pulmonary disease (COPD) is a common lung condition in which airflow from the lungs is limited. COPD is a general term that can be used to describe many different lung problems that limit airflow, including both chronic bronchitis and emphysema. If you have COPD, your lung function will probably never return to normal, but there are measures you can take to improve lung function and make yourself feel better.  CAUSES   Smoking (common).   Exposure to secondhand smoke.   Genetic  problems.  Chronic inflammatory lung diseases or recurrent infections. SYMPTOMS   Shortness of breath, especially with physical activity.   Deep, persistent (chronic) cough with a large amount of thick mucus.   Wheezing.   Rapid breaths (tachypnea).   Gray or bluish discoloration (cyanosis) of the skin, especially in fingers, toes, or lips.   Fatigue.   Weight loss.   Frequent infections or episodes when breathing symptoms become much worse (exacerbations).   Chest tightness. DIAGNOSIS  Your healthcare provider will take a medical history and perform a physical examination to make the initial diagnosis. Additional tests for COPD may include:   Lung (pulmonary) function tests.  Chest X-ray.  CT scan.  Blood tests. TREATMENT  Treatment available to help you feel better when you have COPD include:   Inhaler and nebulizer medicines. These help manage the symptoms of COPD and make your breathing more comfortable  Supplemental oxygen. Supplemental oxygen is only helpful if you have a low oxygen level in your blood.   Exercise and physical  activity. These are beneficial for nearly all people with COPD. Some people may also benefit from a pulmonary rehabilitation program. HOME CARE INSTRUCTIONS   Take all medicines (inhaled or pills) as directed by your health care provider.  Only take over-the-counter or prescription medicines for pain, fever, or discomfort as directed by your health care provider.   Avoid over-the-counter medicines or cough syrups that dry up your airway (such as antihistamines) and slow down the elimination of secretions unless instructed otherwise by your healthcare provider.   If you are a smoker, the most important thing that you can do is stop smoking. Continuing to smoke will cause further lung damage and breathing trouble. Ask your health care provider for help with quitting smoking. He or she can direct you to community resources or  hospitals that provide support.  Avoid exposure to irritants such as smoke, chemicals, and fumes that aggravate your breathing.  Use oxygen therapy and pulmonary rehabilitation if directed by your health care provider. If you require home oxygen therapy, ask your healthcare provider whether you should purchase a pulse oximeter to measure your oxygen level at home.   Avoid contact with individuals who have a contagious illness.  Avoid extreme temperature and humidity changes.  Eat healthy foods. Eating smaller, more frequent meals and resting before meals may help you maintain your strength.  Stay active, but balance activity with periods of rest. Exercise and physical activity will help you maintain your ability to do things you want to do.  Preventing infection and hospitalization is very important when you have COPD. Make sure to receive all the vaccines your health care provider recommends, especially the pneumococcal and influenza vaccines. Ask your healthcare provider whether you need a pneumonia vaccine.  Learn and use relaxation techniques to manage stress.  Learn and use controlled breathing techniques as directed by your health care provider. Controlled breathing techniques include:   Pursed lip breathing. Start by breathing in (inhaling) through your nose for 1 second. Then, purse your lips as if you were going to whistle and breathe out (exhale) through the pursed lips for 2 seconds.   Diaphragmatic breathing. Start by putting one hand on your abdomen just above your waist. Inhale slowly through your nose. The hand on your abdomen should move out. Then purse your lips and exhale slowly. You should be able to feel the hand on your abdomen moving in as you exhale.   Learn and use controlled coughing to clear mucus from your lungs. Controlled coughing is a series of short, progressive coughs. The steps of controlled coughing are:  1. Lean your head slightly forward.   2. Breathe in deeply using diaphragmatic breathing.  3. Try to hold your breath for 3 seconds.  4. Keep your mouth slightly open while coughing twice.  5. Spit any mucus out into a tissue.  6. Rest and repeat the steps once or twice as needed. SEEK MEDICAL CARE IF:   You are coughing up more mucus than usual.   There is a change in the color or thickness of your mucus.   Your breathing is more labored than usual.   Your breathing is faster than usual.  SEEK IMMEDIATE MEDICAL CARE IF:   You have shortness of breath while you are resting.   You have shortness of breath that prevents you from:  Being able to talk.   Performing your usual physical activities.   You have chest pain lasting longer than 5 minutes.   Your skin  color is more cyanotic than usual.  You measure low oxygen saturations for longer than 5 minutes with a pulse oximeter. MAKE SURE YOU:   Understand these instructions.  Will watch your condition.  Will get help right away if you are not doing well or get worse. Document Released: 04/09/2005 Document Revised: 04/20/2013 Document Reviewed: 02/24/2013 [facility] Patient Information 2015 [place], [place]. This information is not intended to replace advice given to you by your health care provider. Make sure you discuss any questions you have with your health care provider.

## 2013-12-28 NOTE — Progress Notes (Signed)
[facility]  Provider:  Larey Seat, M D  Referring Provider: Orlena Sheldon, PA-C Primary Care Physician:  [name], PA-C  Chief Complaint  Patient presents with  . New Evaluation    Room 10  . Limb pain    HPI:  Ryan [name] is a 53 y.o. caucasian, married ,  Left handed  male  Is seen here as a referral  from [place]. [place] for "leg pain".  Mr. Stolz is an active smoker with a history of severe leg pain going on for several months. He was first seen by a podiatrist who worked him up for diabetic neuropathy but according to the patient the tests were not revealing this as a diagnosis. Over the last months the pains have increased with a weight him up at night and he describes the lower extremity pain as a severe on deep splitting sensation in incredible pain, arising from the knee and radiating down to the feet. The pain affects both legs, but not at the same time simultaneously. He will have to walk about for about 30 minutes or longer to relief the pain. He has used a heating pad at home which has given him some relief of symptoms but it still took over half an hour to get relief.  The patient's last hospital admission was on in 07/09/2013 he was diagnosed with pneumonia she had for the palpitations and increased heart rate and was diagnosed as recurrent atrial fibrillations. As well as community-acquired pneumonia. By April of this year of he was coughing up blood. He has emphysema.  His physician's explained to him that his violent coughing may have let to a vessel bursting and that this caused the  hemoptysis. He has a "spot" on his lung, found by x-ray.     The patient smokes a half pack of cigarettes. He lives with his spouse.  The patient is unemployed and watches TV all day, he will go shopping for groceries , but does not exercise in any way. He is unaware of trigger activities. He is disabled since 2011, COPD / emphysema. He has 2 cardiac catheterizations  with stent placement in 2000. He has no known history of PAD/ PVD. No  Vascular claudication , no incontinence .  No unintended weight loss.       Review of Systems: Out of a complete 14 system review, the patient complains of only the following symptoms, and all other reviewed systems are negative.  The patient is under the impression that neurological care is pain treatment for joint and limb pain.    History   Social History  . Marital Status: Married    Spouse Name: [name]    Number of Children: 4  . Years of Education: 10   Occupational History  . Not on file.   Social History Main Topics  . Smoking status: Current Every Day Smoker -- 0.50 packs/day for 45 years    Types: Cigarettes  . Smokeless tobacco: Never Used     Comment: started smoking half pack 2 days ago per stopped in the hospital as of 07-20-2013  . Alcohol Use: No  . Drug Use: No  . Sexual Activity: Yes    Birth Control/ Protection: Surgical   Other Topics Concern  . Not on file   Social History Narrative   Patient is married [name]) and lives at home with his wife and one child.   Patient has four children.   Patient is disabled.  Patient has a high school education.   Patient is left-handed.   Patient does not drink any caffeine.    Family History  Problem Relation Age of Onset  . Hypertension Mother   . Diabetes Mother   . Heart attack Mother   . Heart failure Mother   . Hypertension Sister   . Diabetes Sister   . Heart failure Sister     Past Medical History  Diagnosis Date  . COPD (chronic obstructive pulmonary disease)   . Hypercholesteremia   . History of pneumonia   . Polycythemia   . Coronary atherosclerosis of native coronary artery     Mild nonobstructive disease at catheterization 2007  . 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 Borderline diabetes   . Seizures     Last seizure 2 yrs ago  . GERD (gastroesophageal reflux disease)   .  Type 2 diabetes mellitus   . DDD (degenerative disc disease)     Cervical and thoracic  . Chronic back pain   . Bronchitis   . Chronic left shoulder pain   . Chronic neck pain   . Atrial fibrillation     Not anticoagulated  . Smoker     Past Surgical History  Procedure Laterality Date  . Colonoscopy w/ endoscopic [place]    . Vasectomy    . Throat biopsy    . Lung biopsy    . Vasectomy  1987    Current Outpatient Prescriptions  Medication Sig Dispense Refill  . albuterol (PROVENTIL HFA;VENTOLIN HFA) 108 (90 BASE) MCG/ACT inhaler Inhale 1-2 puffs into the lungs every 6 (six) hours as needed for wheezing or shortness of breath.  1 Inhaler  0  . albuterol (VENTOLIN HFA) 108 (90 BASE) MCG/ACT inhaler Inhale 2 puffs into the lungs every 4 (four) hours as needed. Shortness of breath      . aspirin EC 81 MG tablet Take 81 mg by mouth daily.      . budesonide-formoterol (SYMBICORT) 80-4.5 MCG/ACT inhaler Inhale 2 puffs into the lungs 2 (two) times daily.  1 Inhaler  12  . fluticasone (FLONASE) 50 MCG/ACT nasal spray Place 2 sprays into both nostrils daily.      . Fluticasone-Salmeterol (ADVAIR DISKUS) 250-50 MCG/DOSE AEPB Inhale 1 puff into the lungs every 12 (twelve) hours.       . nitroGLYCERIN (NITROSTAT) 0.4 MG SL tablet Place 1 tablet (0.4 mg total) under the tongue every 5 (five) minutes as needed for chest pain.  25 tablet  4  . tiotropium (SPIRIVA) 18 MCG inhalation capsule Place 18 mcg into inhaler and inhale daily.       No current facility-administered medications for this visit.    Allergies as of 12/28/2013 - Review Complete 12/28/2013  Allergen Reaction Noted  . Influenza vaccine live Swelling 04/24/2011    Vitals: BP 107/70  Pulse 97  Resp 16  Ht 5' 7.25" (1.708 m)  Wt 140 lb (63.504 kg)  BMI 21.77 kg/m2 Last Weight:  Wt Readings from Last 1 Encounters:  12/28/13 140 lb (63.504 kg)   Last Height:   Ht Readings from Last 1 Encounters:  12/28/13 5' 7.25" (1.708 m)     Physical exam:  General: The patient is awake, alert and appears not in acute distress. The patient is well groomed. Head: Normocephalic, atraumatic. Neck is supple. Mallampati 2 , neck circumference: 14.5 , poor dentition.  Cardiovascular:  Regular rate and rhythm , without  murmurs or carotid bruit, and without distended neck veins. Respiratory: Lungs are clear to auscultation. Skin:  Without evidence of edema, or rash Trunk: BMI is normal .  Neurologic exam : The patient is awake and alert, oriented to place and time.  Memory subjective  described as intact.  There is a normal attention span & concentration ability. Speech is fluent with dysphonia not aphasia. Mood and affect are appropriate.  Cranial nerves: Pupils are equal and briskly reactive to light. Funduscopic exam without  evidence of pallor or edema. Extraocular movements  in vertical and horizontal planes intact and without nystagmus. Visual fields by finger perimetry are intact. Hearing to finger rub intact.  Facial sensation intact to fine touch. Facial motor strength is symmetric and tongue and uvula move midline.  Motor exam:  Diffuse tone increase. ROM limitation due to pain, left rotator shoulder movements are impaired, pain.  Both hips not painful to palpation, but lower back  Lumbar area a,gluteal radiation.   normal muscle bulk and symmetric normal strength in all extremities. He is able to lift either foot , but was given braces to help with foot pain ( nor brought to appointment)   Sensory:  Fine touch, pinprick and vibration were tested in all extremities. Proprioception is normal.  Coordination: Rapid alternating movements in the fingers/hands is tested and normal. Finger-to-nose maneuver tested and normal without evidence of  dysmetria or tremor.  Gait and station: Patient walks without assistive device . Deep tendon reflexes: in the  upper and lower extremities are symmetric , very brisk , almost clonic.   . Babinski maneuver response is upgoing , the feet are extremely pain sensitive.     Assessment:  After physical and neurologic examination, review of laboratory studies, imaging, neurophysiology testing and pre-existing records, assessment is   Hyperreflexia and upgoing toe in a patient with chronic lower back pain. The patient needs to see orthopedist or neurosurgeon for  Evaluation of the back.  MRI thoracic and lumbar spine. Patient to use Neurontin for nerve pain. EMG and NCS with Dr Brien Few or Luan Pulling  , the patient had undergone a skin biopsy with his podiatrist - ruled out small fiber neuropathy.  Neuropathy panel ordered , B12 ,    Plan:  Treatment plan and additional workup : as listed above , EMG and NCS for localisation, MRI , referral to neurosurgery and pain treatment.

## 2014-01-10 ENCOUNTER — Ambulatory Visit
Admission: RE | Admit: 2014-01-10 | Discharge: 2014-01-10 | Disposition: A | Discharge status: Home / Self Care | Payer: Medicare Other | Source: Ambulatory Visit | Attending: Neurology | Admitting: Neurology

## 2014-01-10 DIAGNOSIS — M25569 Pain in unspecified knee: Secondary | ICD-10-CM

## 2014-01-10 DIAGNOSIS — M62838 Other muscle spasm: Secondary | ICD-10-CM

## 2014-01-10 DIAGNOSIS — M549 Dorsalgia, unspecified: Secondary | ICD-10-CM | POA: Diagnosis not present

## 2014-01-10 DIAGNOSIS — M79609 Pain in unspecified limb: Secondary | ICD-10-CM | POA: Diagnosis not present

## 2014-01-10 MED ORDER — GADOBENATE DIMEGLUMINE 529 MG/ML IV SOLN
13.0000 mL | Freq: Once | INTRAVENOUS | Status: AC | PRN
  Administered 2014-01-10: 13 mL via INTRAVENOUS

## 2014-01-14 IMAGING — CR DG CHEST 1V PORT
1 series · 1 of 1 positions shown · non-contrast
Comparison: 08/29/2012

CLINICAL DATA: Shortness of breath.  COPD.  Lung cancer.

PORTABLE CHEST - 1 VIEW

[view not recorded]
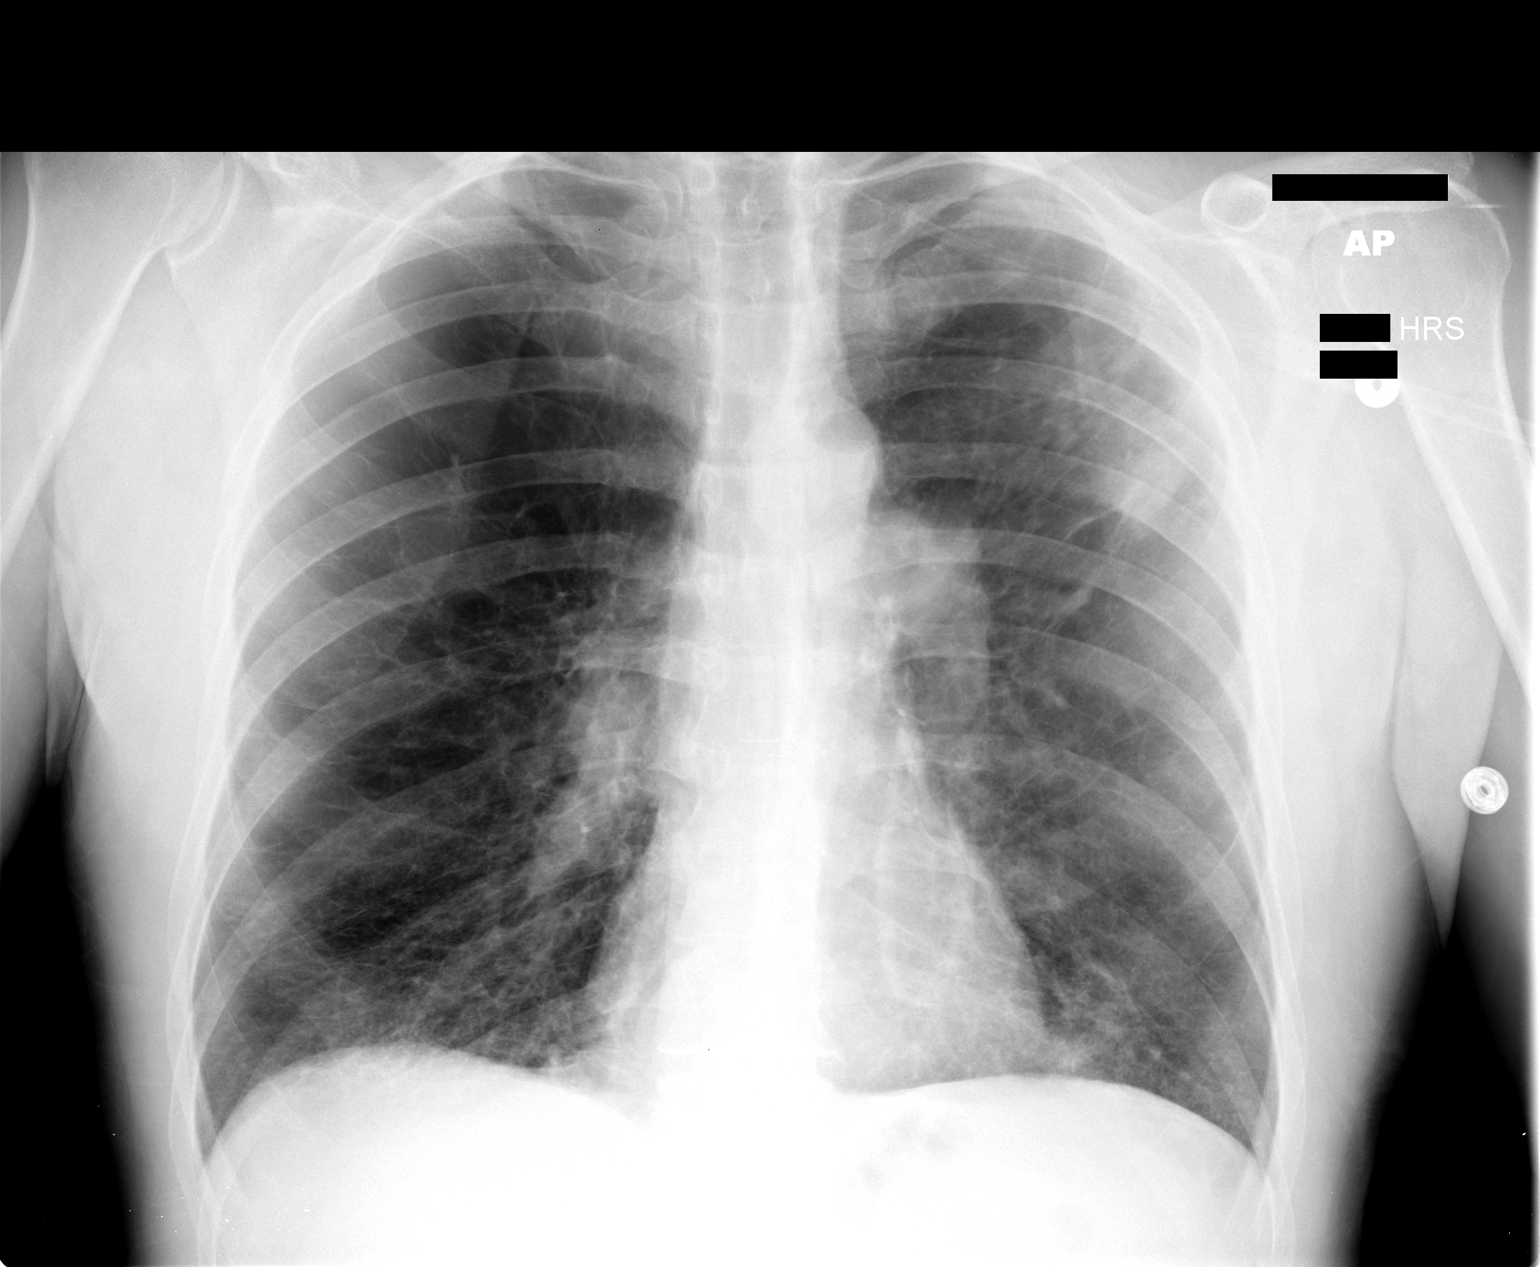

[1 of 1 positions shown; findings below may reference images not displayed]

FINDINGS: The cavitary lesion in the left upper lobe is less
distinct.  There is persistent fullness of the left hilum
consistent with adenopathy seen on prior exams.

Diffuse chronic interstitial and obstructive lung disease,
unchanged.  Heart size and pulmonary vascularity are unchanged.  No
acute osseous abnormality.
IMPRESSION: Cavitary lesion in the left upper lobe is slightly less distinct.
No other change.  Severe chronic lung disease.

## 2014-01-16 ENCOUNTER — Emergency Department (HOSPITAL_COMMUNITY): Payer: Medicare Other

## 2014-01-16 ENCOUNTER — Encounter (HOSPITAL_COMMUNITY): Payer: Self-pay | Admitting: Emergency Medicine

## 2014-01-16 ENCOUNTER — Emergency Department (HOSPITAL_COMMUNITY)
Admission: EM | Admit: 2014-01-16 | Discharge: 2014-01-16 | Disposition: A | Discharge status: Home / Self Care | Payer: Medicare Other | Attending: Emergency Medicine | Admitting: Emergency Medicine

## 2014-01-16 DIAGNOSIS — Z8701 Personal history of pneumonia (recurrent): Secondary | ICD-10-CM | POA: Diagnosis not present

## 2014-01-16 DIAGNOSIS — Z9889 Other specified postprocedural states: Secondary | ICD-10-CM | POA: Insufficient documentation

## 2014-01-16 DIAGNOSIS — F17200 Nicotine dependence, unspecified, uncomplicated: Secondary | ICD-10-CM | POA: Diagnosis not present

## 2014-01-16 DIAGNOSIS — J4489 Other specified chronic obstructive pulmonary disease: Secondary | ICD-10-CM | POA: Insufficient documentation

## 2014-01-16 DIAGNOSIS — E119 Type 2 diabetes mellitus without complications: Secondary | ICD-10-CM | POA: Diagnosis not present

## 2014-01-16 DIAGNOSIS — Z8719 Personal history of other diseases of the digestive system: Secondary | ICD-10-CM | POA: Diagnosis not present

## 2014-01-16 DIAGNOSIS — R209 Unspecified disturbances of skin sensation: Secondary | ICD-10-CM | POA: Insufficient documentation

## 2014-01-16 DIAGNOSIS — R0789 Other chest pain: Secondary | ICD-10-CM | POA: Diagnosis not present

## 2014-01-16 DIAGNOSIS — J449 Chronic obstructive pulmonary disease, unspecified: Secondary | ICD-10-CM | POA: Insufficient documentation

## 2014-01-16 DIAGNOSIS — G8929 Other chronic pain: Secondary | ICD-10-CM | POA: Insufficient documentation

## 2014-01-16 DIAGNOSIS — Z8739 Personal history of other diseases of the musculoskeletal system and connective tissue: Secondary | ICD-10-CM | POA: Insufficient documentation

## 2014-01-16 DIAGNOSIS — R202 Paresthesia of skin: Secondary | ICD-10-CM

## 2014-01-16 DIAGNOSIS — Z79899 Other long term (current) drug therapy: Secondary | ICD-10-CM | POA: Insufficient documentation

## 2014-01-16 DIAGNOSIS — I2510 Atherosclerotic heart disease of native coronary artery without angina pectoris: Secondary | ICD-10-CM | POA: Insufficient documentation

## 2014-01-16 DIAGNOSIS — Z7982 Long term (current) use of aspirin: Secondary | ICD-10-CM | POA: Insufficient documentation

## 2014-01-16 DIAGNOSIS — IMO0002 Reserved for concepts with insufficient information to code with codable children: Secondary | ICD-10-CM | POA: Insufficient documentation

## 2014-01-16 LAB — BASIC METABOLIC PANEL
ANION GAP: 13 (ref 5–15)
BUN: 8 mg/dL (ref 6–23)
CALCIUM: 8.9 mg/dL (ref 8.4–10.5)
CHLORIDE: 97 meq/L (ref 96–112)
CO2: 27 mEq/L (ref 19–32)
CREATININE: 0.99 mg/dL (ref 0.50–1.35)
Glucose, Bld: 157 mg/dL — ABNORMAL HIGH (ref 70–99)
Potassium: 4.2 mEq/L (ref 3.7–5.3)
Sodium: 137 mEq/L (ref 137–147)

## 2014-01-16 LAB — CBC WITH DIFFERENTIAL/PLATELET
BASOS ABS: 0 10*3/uL (ref 0.0–0.1)
Basophils Relative: 0 % (ref 0–1)
EOS ABS: 0.1 10*3/uL (ref 0.0–0.7)
Eosinophils Relative: 1 % (ref 0–5)
HCT: 42.8 % (ref 39.0–52.0)
Hemoglobin: 14.6 g/dL (ref 13.0–17.0)
Lymphocytes Relative: 25 % (ref 12–46)
Lymphs Abs: 1.6 10*3/uL (ref 0.7–4.0)
MCH: 28.3 pg (ref 26.0–34.0)
MCHC: 34.1 g/dL (ref 30.0–36.0)
MCV: 83.1 fL (ref 78.0–100.0)
Monocytes Absolute: 0.6 10*3/uL (ref 0.1–1.0)
Monocytes Relative: 10 % (ref 3–12)
NEUTROS ABS: 4 10*3/uL (ref 1.7–7.7)
NEUTROS PCT: 64 % (ref 43–77)
PLATELETS: 217 10*3/uL (ref 150–400)
RBC: 5.15 MIL/uL (ref 4.22–5.81)
RDW: 13.7 % (ref 11.5–15.5)
WBC: 6.3 10*3/uL (ref 4.0–10.5)

## 2014-01-16 MED ORDER — OXYCODONE-ACETAMINOPHEN 5-325 MG PO TABS: 1.0000 | ORAL_TABLET | ORAL | Status: DC | PRN

## 2014-01-16 NOTE — Discharge Instructions (Signed)
Head CT showed no problems.  Prescription for pain medication. Follow up with your neurologist.

## 2014-01-16 NOTE — ED Notes (Signed)
MD at bedside. 

## 2014-01-16 NOTE — ED Notes (Signed)
Pt ambulated in hallway independently around nurse's station. Steady gait. Denies weakness/dizziness. nad noted.

## 2014-01-16 NOTE — ED Provider Notes (Signed)
CSN: [id number]     Arrival date & time 01/16/14  0755 History  This chart was scribed for Nat Christen, MD by Elby Beck, ED Scribe. This patient was seen in room APA14/APA14 and the patient's care was started at 8:16 AM.   Chief Complaint  Patient presents with  . r side of body pain     The history is provided by the patient. No language interpreter was used.    HPI Comments: Ryan [name] is a 53 y.o. male with a history of COPD who presents to the Emergency Department complaining of right sided chest pain over the past 3 days. He states that the pain radiates to his right arm and right leg as well as to the right side of his back, near the scapula. He describes his pain as pressure. He reports associated paresthesias in his right and right lower leg. He also states that he has had weakness in his right leg. He is concerned that he may have had a stroke. He states that he has tried applying a heating pad to the affected areas without relief. He states that there ar no modifying factors for his symptoms. He states that he has been ambulating normally and he was able to drive himself to the ED today. He states that he has a history of similar symptoms in the past which were self-limiting. He states that on 01/10/14 he had an MRI of his back. He states that he is a 1 pack/day smoker of 48 years.     Past Medical History  Diagnosis Date  . COPD (chronic obstructive pulmonary disease)   . Hypercholesteremia   . History of pneumonia   . Polycythemia   . Coronary atherosclerosis of native coronary artery     Mild nonobstructive disease at catheterization 2007  . 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 Borderline diabetes   . Seizures     Last seizure 2 yrs ago  . GERD (gastroesophageal reflux disease)   . Type 2 diabetes mellitus   . DDD (degenerative disc disease)     Cervical and thoracic  . Chronic back pain   . Bronchitis   . Chronic left shoulder  pain   . Chronic neck pain   . Atrial fibrillation     Not anticoagulated  . Smoker    Past Surgical History  Procedure Laterality Date  . Colonoscopy w/ endoscopic [place]    . Vasectomy    . Throat biopsy    . Lung biopsy    . Vasectomy  1987   Family History  Problem Relation Age of Onset  . Hypertension Mother   . Diabetes Mother   . Heart attack Mother   . Heart failure Mother   . Hypertension Sister   . Diabetes Sister   . Heart failure Sister    History  Substance Use Topics  . Smoking status: Current Every Day Smoker -- 0.50 packs/day for 45 years    Types: Cigarettes  . Smokeless tobacco: Never Used     Comment: started smoking half pack 2 days ago per stopped in the hospital as of 07-20-2013  . Alcohol Use: No    Review of Systems A complete 10 system review of systems was obtained and all systems are negative except as noted in the HPI and PMH.   Allergies  Influenza vaccine live  Home Medications   Prior to Admission medications   Medication Sig  Start Date End Date Taking? Authorizing Provider  albuterol (VENTOLIN HFA) 108 (90 BASE) MCG/ACT inhaler Inhale 2 puffs into the lungs every 4 (four) hours as needed. Shortness of breath   Yes Historical Provider, MD  aspirin EC 81 MG tablet Take 81 mg by mouth daily.   Yes Historical Provider, MD  budesonide-formoterol (SYMBICORT) 80-4.5 MCG/ACT inhaler Inhale 2 puffs into the lungs 2 (two) times daily. 10/21/13  Yes Charlynne Cousins, MD  fluticasone [facility]) 50 MCG/ACT nasal spray Place 2 sprays into both nostrils daily.   Yes Historical Provider, MD  Fluticasone-Salmeterol (ADVAIR DISKUS) 250-50 MCG/DOSE AEPB Inhale 1 puff into the lungs every 12 (twelve) hours.    Yes Historical Provider, MD  nitroGLYCERIN (NITROSTAT) 0.4 MG SL tablet Place 1 tablet (0.4 mg total) under the tongue every 5 (five) minutes as needed for chest pain. 04/22/13  Yes Lendon Colonel, NP  tiotropium (SPIRIVA) 18 MCG inhalation capsule  Place 18 mcg into inhaler and inhale daily.   Yes Historical Provider, MD  oxyCODONE-acetaminophen (PERCOCET) 5-325 MG per tablet Take 1 tablet by mouth every 4 (four) hours as needed. 01/16/14   Nat Christen, MD   Triage Vitals: BP 112/72  Pulse 89  Temp(Src) 98.1 F (36.7 C) (Oral)  Resp 18  Ht 5\' 7"  (1.702 m)  Wt 140 lb (63.504 kg)  BMI 21.92 kg/m2  SpO2 93%  Physical Exam  Nursing note and vitals reviewed. Constitutional: He is oriented to person, place, and time. He appears well-developed and well-nourished.  HENT:  Head: Normocephalic and atraumatic.  Eyes: Conjunctivae and EOM are normal. Pupils are equal, round, and reactive to light.  Neck: Normal range of motion. Neck supple.  Cardiovascular: Normal rate, regular rhythm and normal heart sounds.   Pulmonary/Chest: Effort normal and breath sounds normal.  Abdominal: Soft. Bowel sounds are normal.  Musculoskeletal: Normal range of motion.  Neurological: He is alert and oriented to person, place, and time.  Skin: Skin is warm and dry.  Psychiatric: He has a normal mood and affect. His behavior is normal.    ED Course  Procedures (including critical care time)  DIAGNOSTIC STUDIES: Oxygen Saturation is 93% on RA, normal by my interpretation.    COORDINATION OF CARE: 8:23 AM- Discussed plan to order a CT scan of pt's head along with a BMP and CBC. Pt advised of plan for treatment and pt agrees.  Results for orders placed during the hospital encounter of 15/17/61  BASIC METABOLIC PANEL      Result Value Ref Range   Sodium 137  137 - 147 mEq/L   Potassium 4.2  3.7 - 5.3 mEq/L   Chloride 97  96 - 112 mEq/L   CO2 27  19 - 32 mEq/L   Glucose, Bld 157 (*) 70 - 99 mg/dL   BUN 8  6 - 23 mg/dL   Creatinine, Ser 0.99  0.50 - 1.35 mg/dL   Calcium 8.9  8.4 - 10.5 mg/dL   GFR calc non Af Amer >90  >90 mL/min   GFR calc Af Amer >90  >90 mL/min   Anion gap 13  5 - 15  CBC WITH DIFFERENTIAL      Result Value Ref Range   WBC 6.3   4.0 - 10.5 K/uL   RBC 5.15  4.22 - 5.81 MIL/uL   Hemoglobin 14.6  13.0 - 17.0 g/dL   HCT 42.8  39.0 - 52.0 %   MCV 83.1  78.0 - 100.0 fL  MCH 28.3  26.0 - 34.0 pg   MCHC 34.1  30.0 - 36.0 g/dL   RDW 13.7  11.5 - 15.5 %   Platelets 217  150 - 400 K/uL   Neutrophils Relative % 64  43 - 77 %   Neutro Abs 4.0  1.7 - 7.7 K/uL   Lymphocytes Relative 25  12 - 46 %   Lymphs Abs 1.6  0.7 - 4.0 K/uL   Monocytes Relative 10  3 - 12 %   Monocytes Absolute 0.6  0.1 - 1.0 K/uL   Eosinophils Relative 1  0 - 5 %   Eosinophils Absolute 0.1  0.0 - 0.7 K/uL   Basophils Relative 0  0 - 1 %   Basophils Absolute 0.0  0.0 - 0.1 K/uL   Ct Head Wo Contrast  01/16/2014   CLINICAL DATA:  Right arm and leg tingling; no reported injury  EXAM: CT HEAD WITHOUT CONTRAST  TECHNIQUE: Contiguous axial images were obtained from the base of the skull through the vertex without intravenous contrast.  COMPARISON:  Noncontrast CT scan of brain of June 28, 2013  FINDINGS: The ventricles are normal in size and position. There is no intracranial hemorrhage nor intracranial mass effect. No acute ischemic changes are demonstrated. The cerebellum and brainstem are normal.  At bone window settings there is no acute skull fracture. The observed portions of the paranasal sinuses are clear.  IMPRESSION: Normal noncontrast CT scan of the brain for age.   Electronically Signed   By: Davari  Martinique   On: 01/16/2014 09:03     EKG Interpretation   Date/Time:  Monday January 16 2014 08:37:17 EDT Ventricular Rate:  86 PR Interval:  121 QRS Duration: 91 QT Interval:  358 QTC Calculation: 428 R Axis:   70 Text Interpretation:  Sinus rhythm Nonspecific T abnrm, anterolateral  leads Baseline wander in lead(s) V6 Confirmed by Pacen Watford  MD, Severo Beber ([id number])  on 01/16/2014 8:44:00 AM      MDM   Final diagnoses:  Tingling sensation     patient appears in no acute distress. CT head negative.  EKG, labs  Normal..  patient has neurological  followup.   I personally performed the services described in this documentation, which was scribed in my presence. The recorded information has been reviewed and is accurate.   Nat Christen, MD 01/16/14 1128

## 2014-01-16 NOTE — ED Notes (Signed)
Pt c/o pain in r side of body and r chest x 3 days.   Denies any injury.  Says has DDD.

## 2014-01-18 NOTE — Progress Notes (Signed)
Quick Note:  Shared normal MR Lumbar results with patient and he verbalized understanding ______

## 2014-01-19 NOTE — Progress Notes (Signed)
Quick Note:  Shared normal Thoracic Spine with patient and he verbalized understanding ______

## 2014-01-24 ENCOUNTER — Encounter (INDEPENDENT_AMBULATORY_CARE_PROVIDER_SITE_OTHER): Payer: Self-pay | Admitting: Radiology

## 2014-01-24 ENCOUNTER — Ambulatory Visit (INDEPENDENT_AMBULATORY_CARE_PROVIDER_SITE_OTHER): Payer: Medicare Other | Admitting: Diagnostic Neuroimaging

## 2014-01-24 DIAGNOSIS — M79609 Pain in unspecified limb: Secondary | ICD-10-CM

## 2014-01-24 DIAGNOSIS — Z0289 Encounter for other administrative examinations: Secondary | ICD-10-CM

## 2014-01-24 DIAGNOSIS — M62838 Other muscle spasm: Secondary | ICD-10-CM

## 2014-01-24 DIAGNOSIS — M25569 Pain in unspecified knee: Secondary | ICD-10-CM

## 2014-01-24 DIAGNOSIS — M549 Dorsalgia, unspecified: Secondary | ICD-10-CM

## 2014-01-24 DIAGNOSIS — M62830 Muscle spasm of back: Secondary | ICD-10-CM

## 2014-01-24 NOTE — Procedures (Signed)
   [facility]  NCS (NERVE CONDUCTION STUDY) WITH EMG (ELECTROMYOGRAPHY) REPORT   STUDY DATE: 01/24/14 PATIENT NAME: Ryan [name] DOB: 1961/03/14 MRN: [id number]  ORDERING CLINICIAN: Larey Seat, MD   TECHNOLOGIST: Towana Badger ELECTROMYOGRAPHER: Earlean Polka. Kellis Topete, MD  CLINICAL INFORMATION: 53 year old male with diabetes and bilateral leg pain.  FINDINGS: NERVE CONDUCTION STUDY: Bilateral peroneal and tibial motor responses and F-wave latencies are normal. Bilateral H reflex responses are normal. Bilateral sural sensory responses are normal.  NEEDLE ELECTROMYOGRAPHY: Right vastus medialis, tibialis anterior, gastrocnemius muscles are normal.  IMPRESSION:  This is a normal study. No electrodiagnostic evidence of large fiber neuropathy at this time.   INTERPRETING PHYSICIAN:  Penni Bombard, MD Certified in Neurology, Neurophysiology and Neuroimaging  [facility] [address] [place], [place] [address] [phone]

## 2014-01-27 NOTE — Progress Notes (Signed)
Quick Note:  Shared normal NCV/EMG results with patient,verbalized understanding ______

## 2014-01-31 IMAGING — CR DG CHEST 2V
3 series · 3 of 3 positions shown · non-contrast
Comparison: 10/17/2012 and CT from 09/07/2012

CLINICAL DATA: Cough and history of emphysema.

CHEST - 2 VIEW

[view not recorded (1 of 3)]
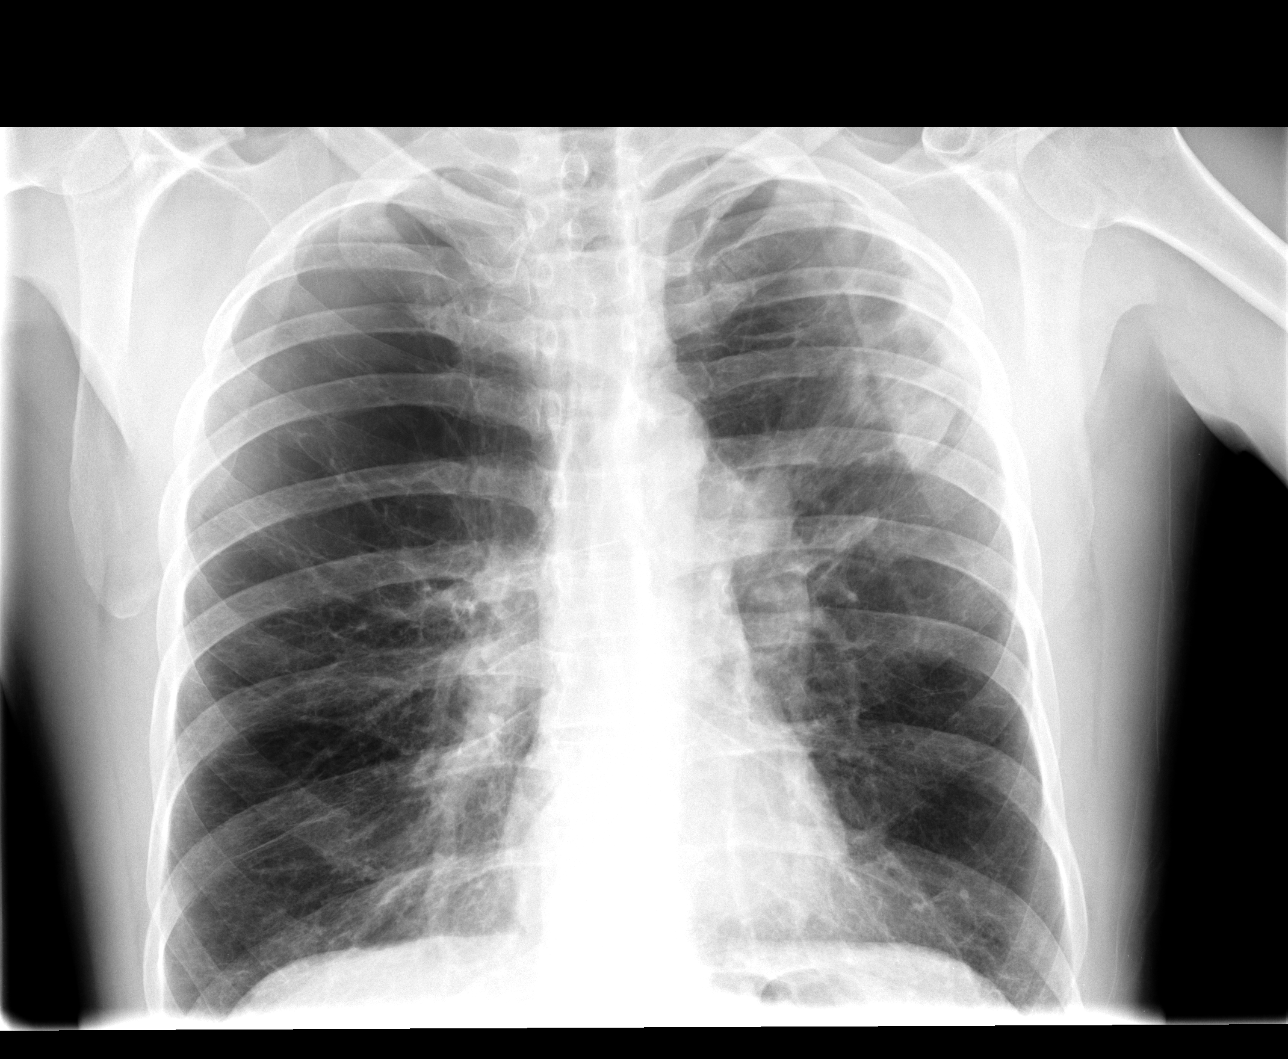

[view not recorded (2 of 3)]
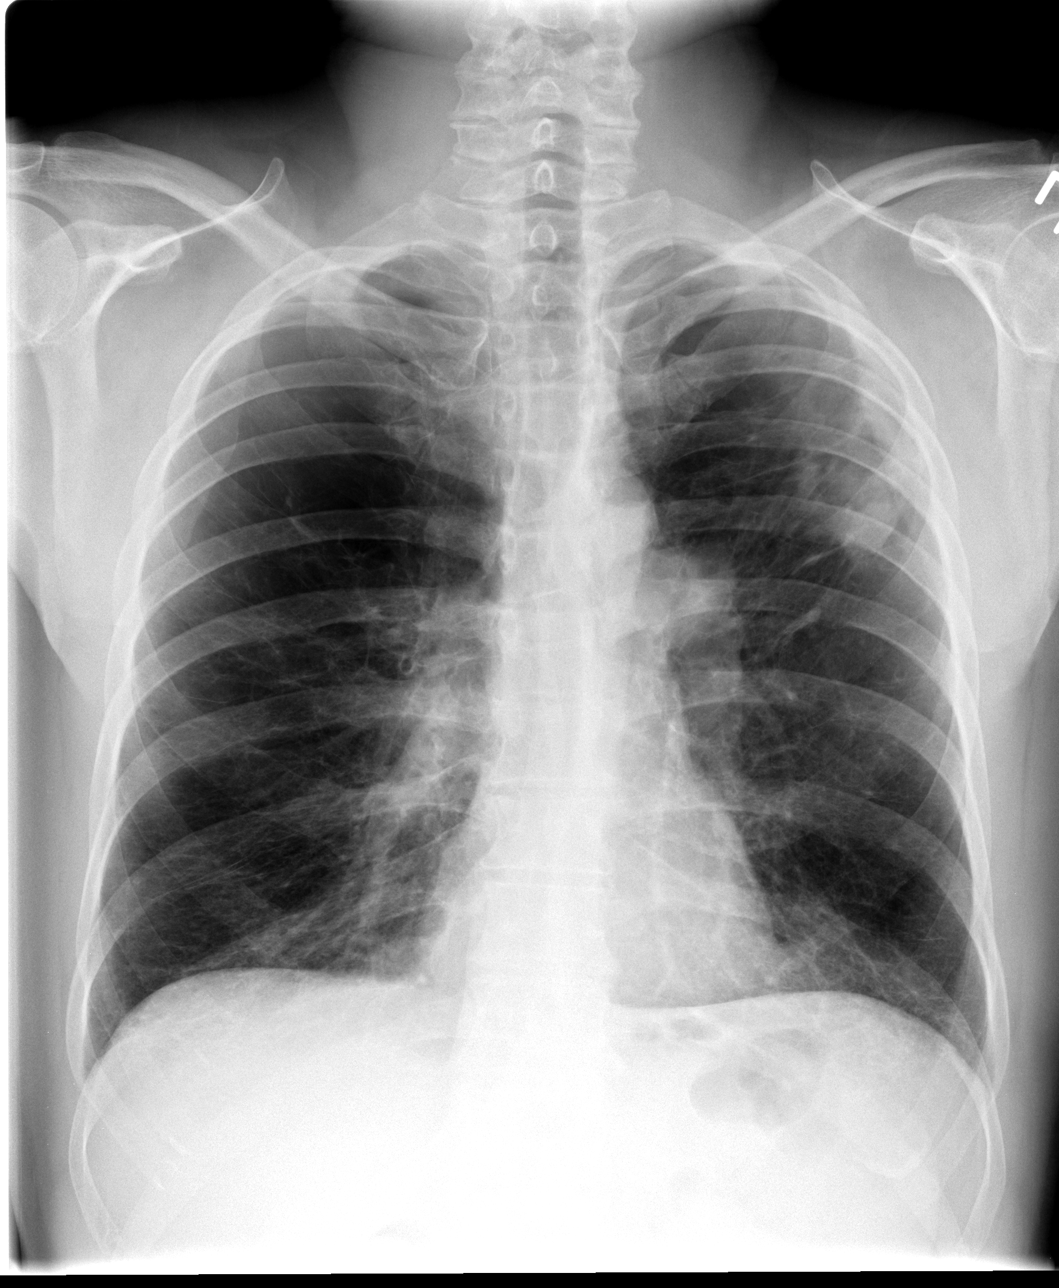

[view not recorded (3 of 3)]
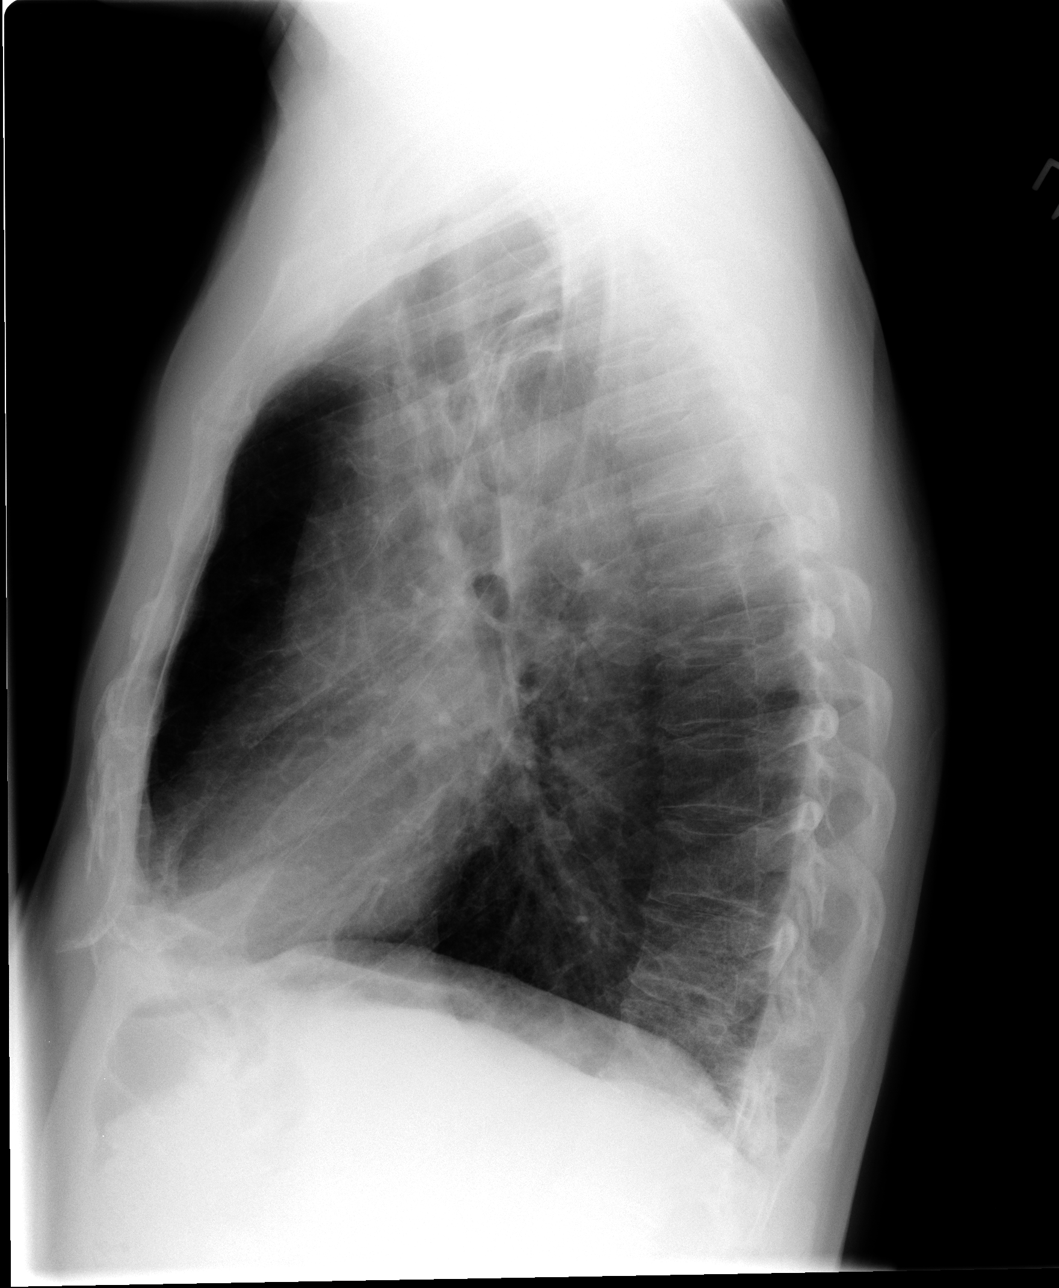

[3 of 3 positions shown; findings below may reference images not displayed]

FINDINGS: Again noted is hyperinflation and emphysematous changes.
There are persistent densities in the left upper lung and the
findings are compatible with the known cavitary lesion.  The
densities in the left upper lung have not significantly changed.
Stable appearance of the heart and mediastinum.
IMPRESSION: Minimal change in the left upper lobe densities.
Findings are compatible with the known cavitary lesion.  No
significant change since the previous examination.

## 2014-02-13 ENCOUNTER — Ambulatory Visit: Payer: Medicare Other | Admitting: Adult Health

## 2014-02-13 ENCOUNTER — Telehealth: Payer: Self-pay | Admitting: Adult Health

## 2014-02-13 NOTE — Telephone Encounter (Signed)
This patient was a no show for a scheduled revisit appointment.

## 2014-02-28 ENCOUNTER — Encounter: Payer: Self-pay | Admitting: Adult Health

## 2014-03-10 IMAGING — CT CT CHEST W/O CM
3 of 4 series · 16 of 30 positions shown, 18 images · non-contrast
Comparison: 11/13/2012 plain film.  09/07/2012 CT.

CLINICAL DATA: Left upper lobe cavitary mass.  Left chest pain for
2 months.  History bronchitis.  Smoker.  Prior biopsy demonstrating
mycobacterial infection.

CT CHEST WITHOUT CONTRAST
TECHNIQUE: Multidetector CT imaging of the chest was performed
following the standard protocol without IV contrast.

[Series 3: chest w/o · axial · non-contrast · 0.70mm/px · z∈[-295,-60]mm · 5 of 71 slices shown, 7 images]
[im 12/71  mediastinal]
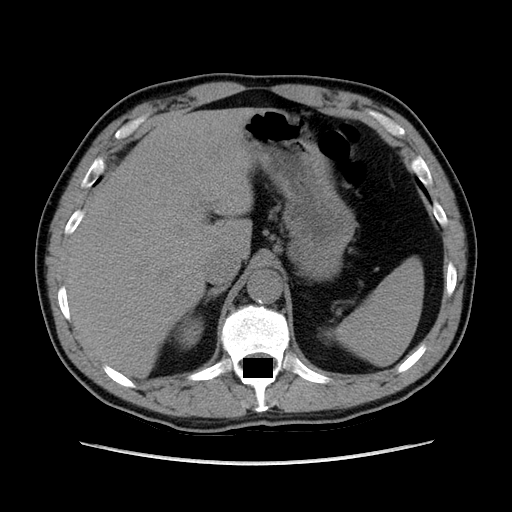
[im 12/71  lung]
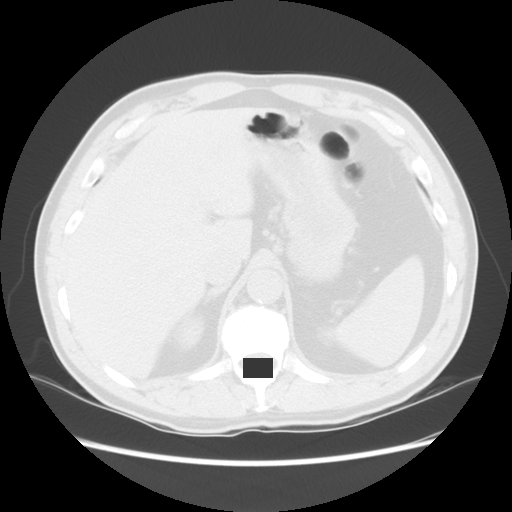
[im 24/71  lung]
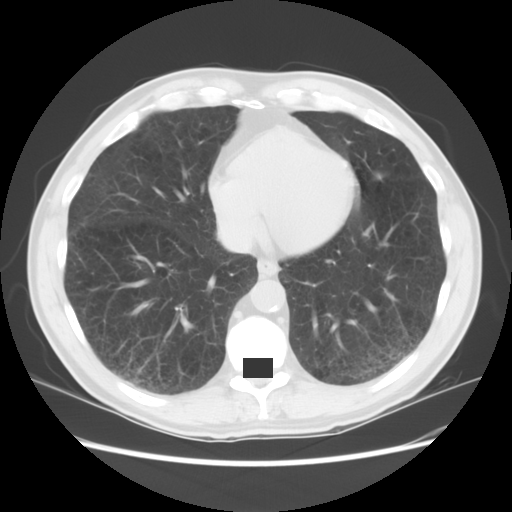
[im 36/71  lung]
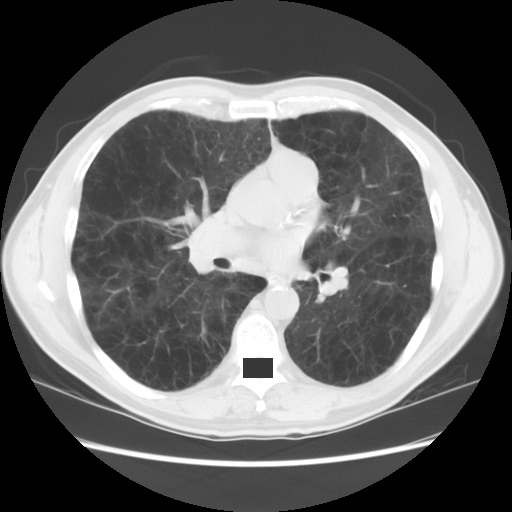
[im 47/71  lung]
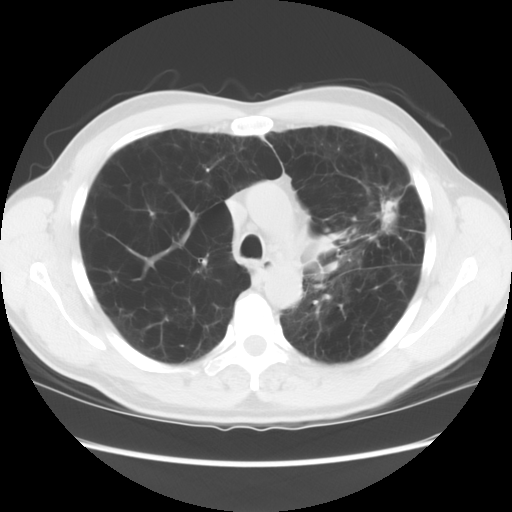
[im 59/71  mediastinal]
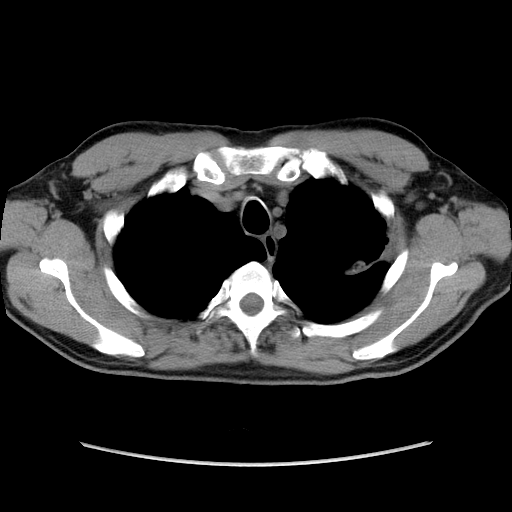
[im 59/71  lung]
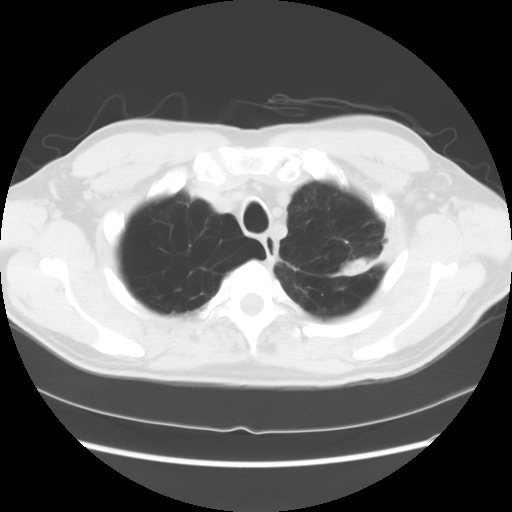

[Series 4: lung windows · axial · 0.70mm/px · z∈[-295,-60]mm · 5 of 71 slices shown]
[im 12/71  lung]
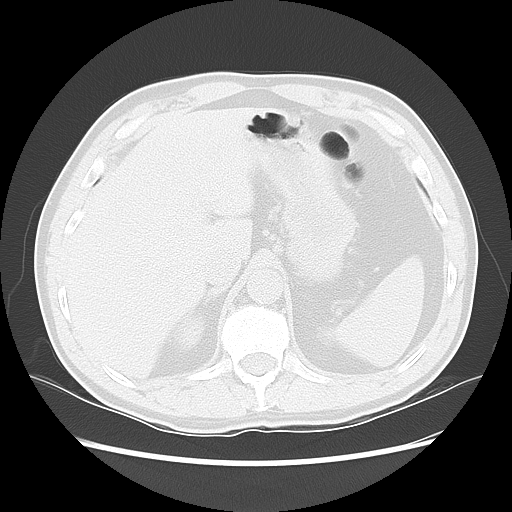
[im 24/71  lung]
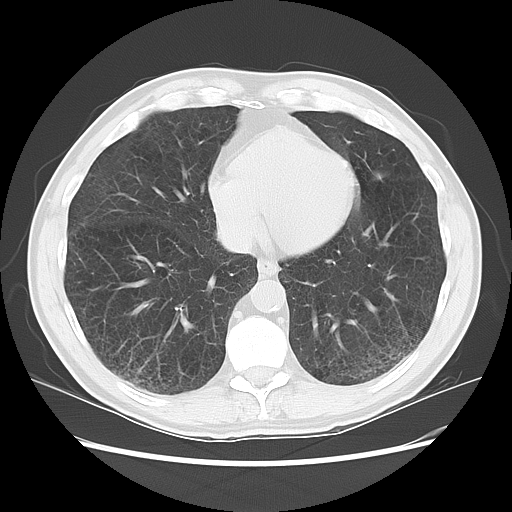
[im 36/71  lung]
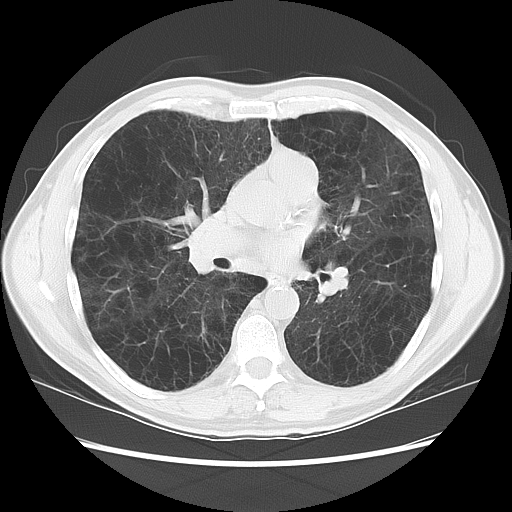
[im 47/71  lung]
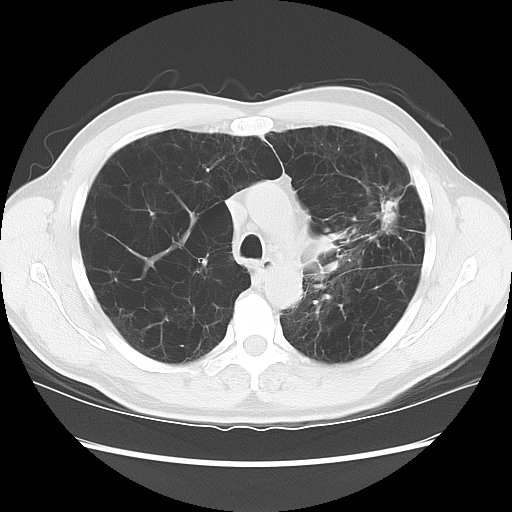
[im 59/71  lung]
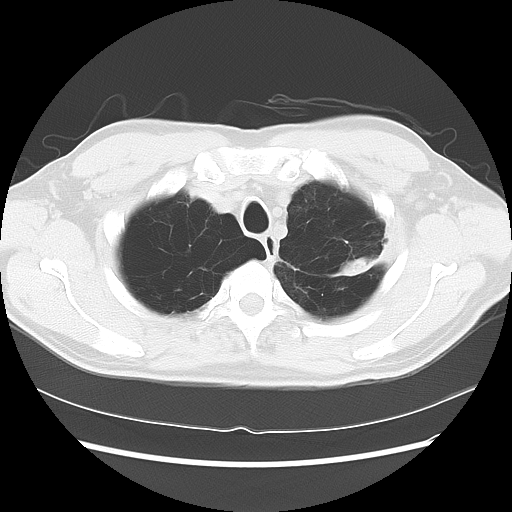

[Series 602: sagittal body · sagittal · 0.70mm/px · 6 of 145 slices shown]
[im 12/145  mediastinal]
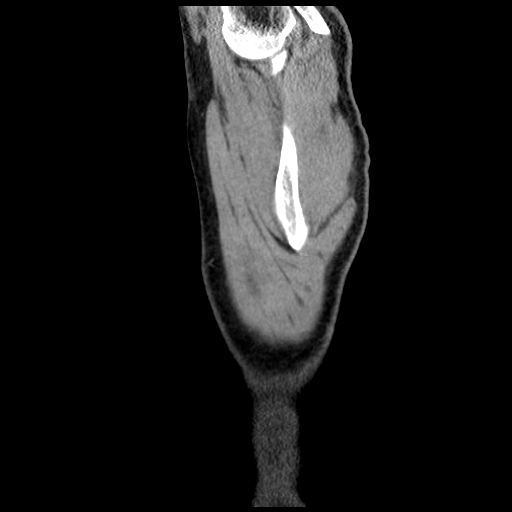
[im 34/145  mediastinal]
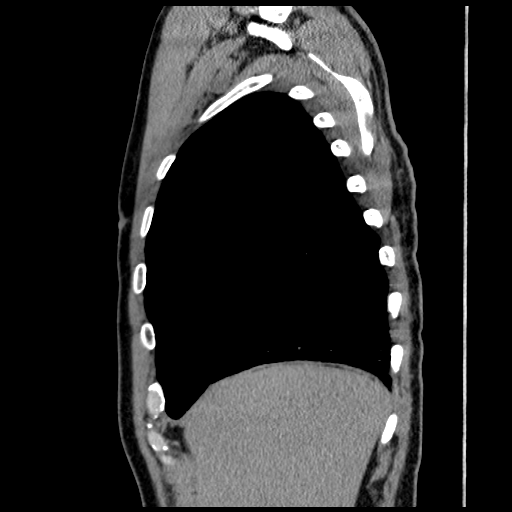
[im 45/145  mediastinal]
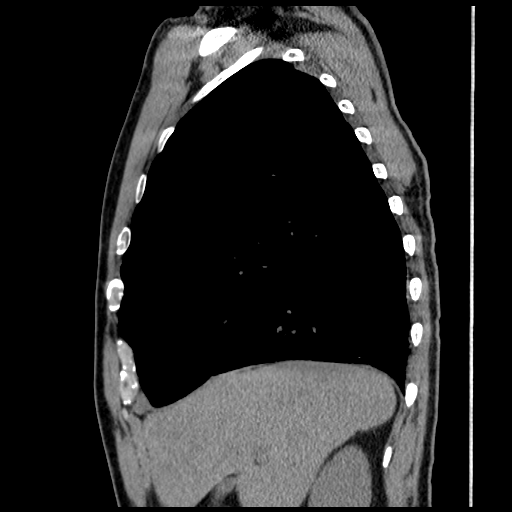
[im 67/145  mediastinal]
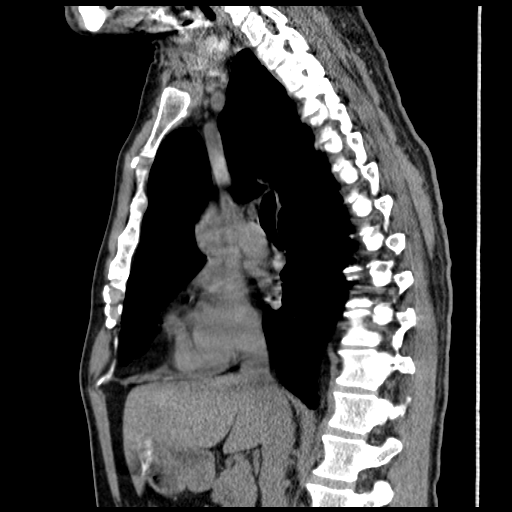
[im 78/145  mediastinal]
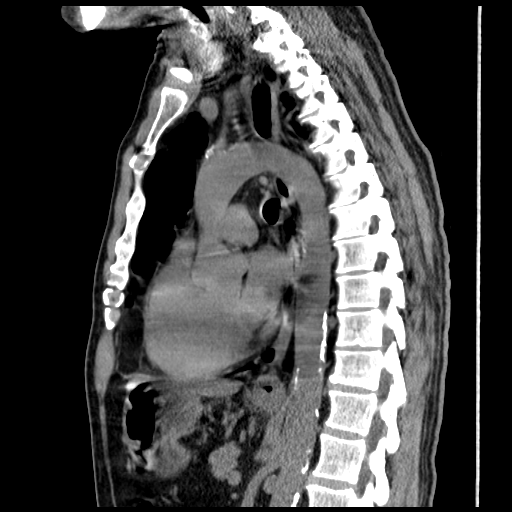
[im 100/145  mediastinal]
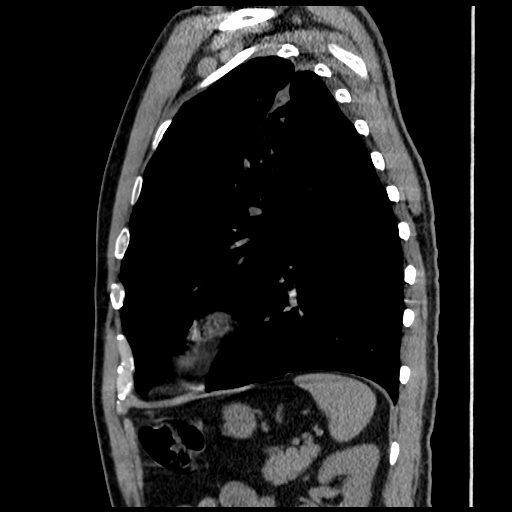

[16 of 30 positions shown; findings below may reference images not displayed]

FINDINGS: Lungs/pleura: Severe centrilobular emphysema.  Mild
dependent bibasilar atelectasis.

Similar lingular nodule at 5 mm.  Image 43/series 4.

Partially cavitary left upper lobe lung mass again identified.
This measures 4.7 x 2.1 cm on image 20/series 4 versus 4.3 x 2.2 cm
at the same level on the prior exam.  On sagittal reformatted
images, maximally 5.7 cm (image 108).  Similar to on the prior exam
(when remeasured).

No pleural fluid.  Similar pleural thickening adjacent the left
upper lobe cavitary mass.

Heart/Mediastinum: No middle mediastinal adenopathy.  Hilar regions
poorly evaluated secondary lack of IV contrast.  There is subtle
soft tissue fullness in the left suprahilar region, lateral to the
AP window.  This measures 1.2 x 1.5 cm on image 27/series 3.  This
is likely similar to on the prior exam, and is along the path of
the superior left pulmonary vein.  Favored to be positioned just
posterior. Normal heart size, without pericardial effusion.
Multivessel coronary artery atherosclerosis.

Upper abdomen: No significant findings.

Bones/Musculoskeletal:  No acute osseous abnormality.
IMPRESSION: 1.  Similar size of a cavitary left upper lobe lung mass.  Adjacent
similar left-sided pleural thickening.
2.  Suspicion of left suprahilar adenopathy.  This is suboptimally
evaluated on this unenhanced exam, but favored to be similar to on
the prior.  Obscured on the prior secondary to low dose technique.
This is presumably reactive, given the benign prior biopsies.
3.  Centrilobular emphysema with a similar 5 mm lingular nodule.
4. Age advanced coronary artery atherosclerosis.  Recommend
assessment of coronary risk factors and consideration of medical
therapy.

## 2014-03-15 DIAGNOSIS — E785 Hyperlipidemia, unspecified: Secondary | ICD-10-CM | POA: Diagnosis not present

## 2014-03-15 DIAGNOSIS — E119 Type 2 diabetes mellitus without complications: Secondary | ICD-10-CM | POA: Diagnosis not present

## 2014-03-15 DIAGNOSIS — G589 Mononeuropathy, unspecified: Secondary | ICD-10-CM | POA: Diagnosis not present

## 2014-03-15 DIAGNOSIS — J449 Chronic obstructive pulmonary disease, unspecified: Secondary | ICD-10-CM | POA: Diagnosis not present

## 2014-03-17 DIAGNOSIS — S46909A Unspecified injury of unspecified muscle, fascia and tendon at shoulder and upper arm level, unspecified arm, initial encounter: Secondary | ICD-10-CM | POA: Diagnosis not present

## 2014-03-17 DIAGNOSIS — S4980XA Other specified injuries of shoulder and upper arm, unspecified arm, initial encounter: Secondary | ICD-10-CM | POA: Diagnosis not present

## 2014-03-17 DIAGNOSIS — S59909A Unspecified injury of unspecified elbow, initial encounter: Secondary | ICD-10-CM | POA: Diagnosis not present

## 2014-04-07 IMAGING — CR DG CHEST 2V
2 series · 2 of 2 positions shown · non-contrast
Comparison: CTs including 12/21/2012.  Most recent plain film of
11/13/2012.

CLINICAL DATA: Back and shoulder pain.  Cough.  Shortness of
breath.  COPD.  Emphysema.

CHEST - 2 VIEW

[view not recorded (1 of 2)]
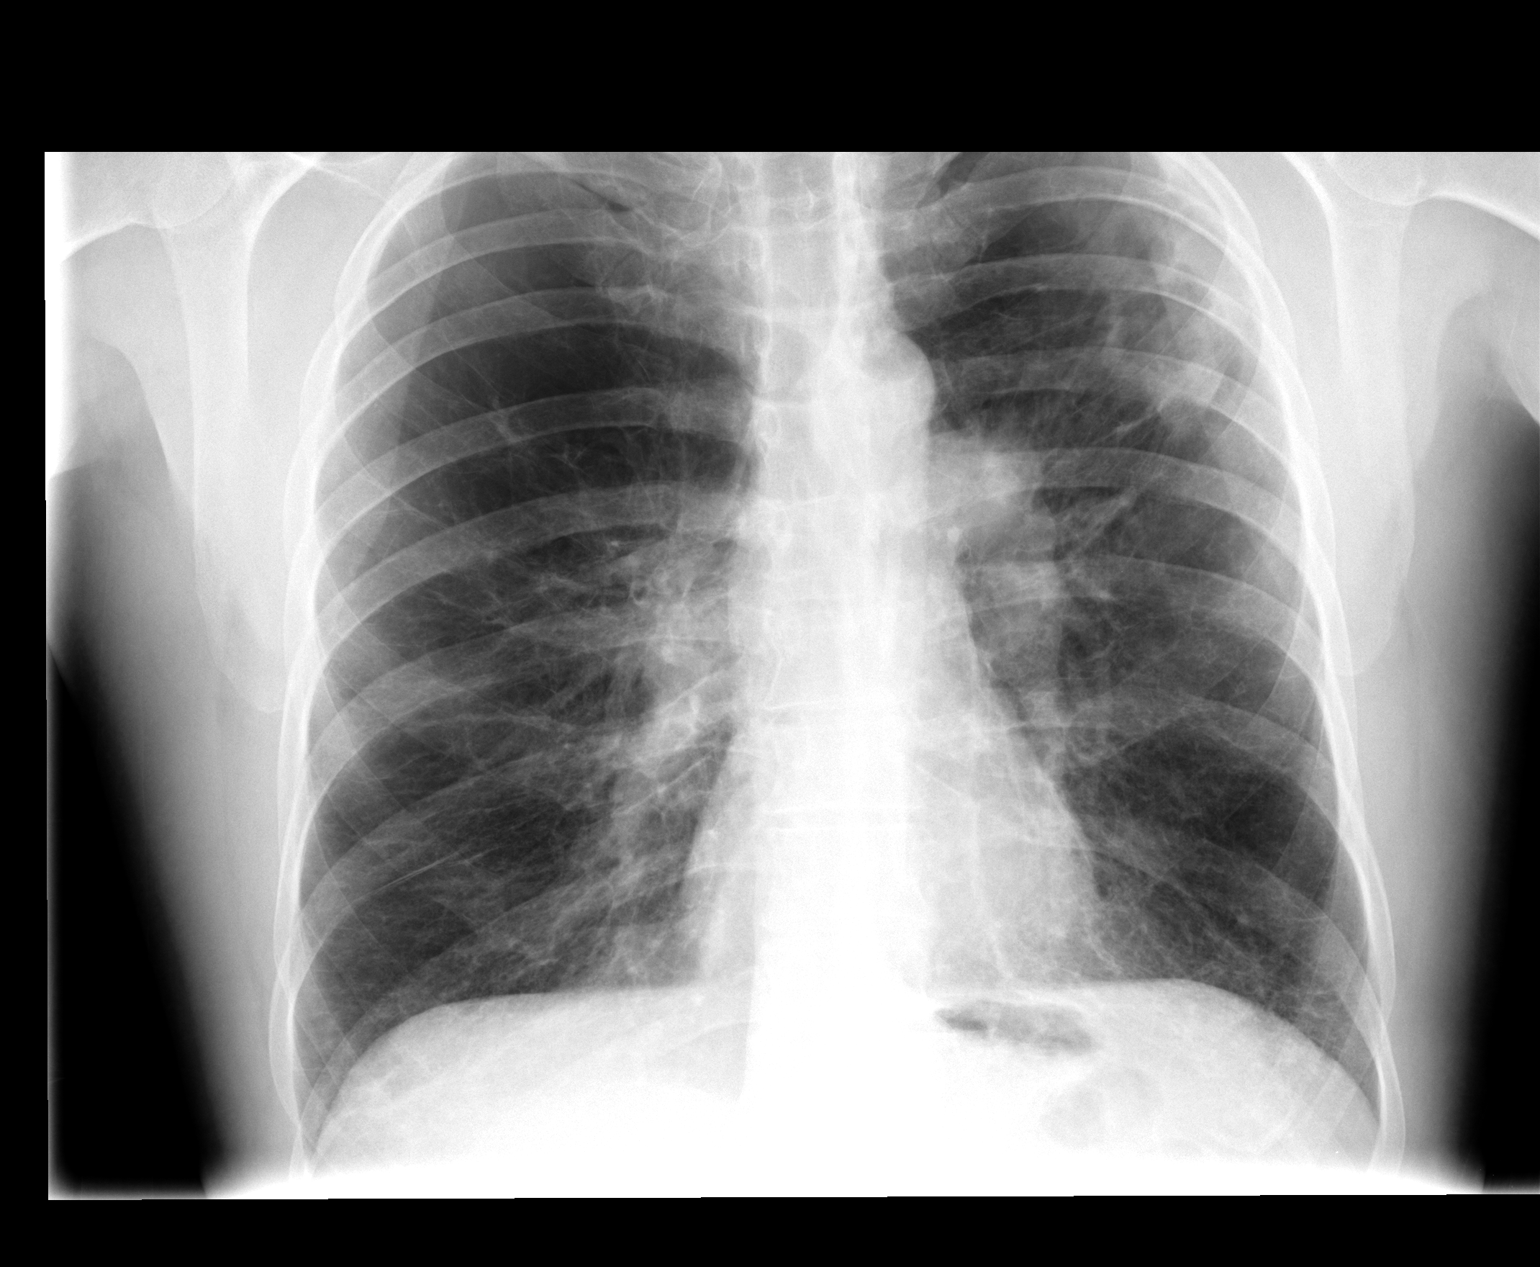

[view not recorded (2 of 2)]
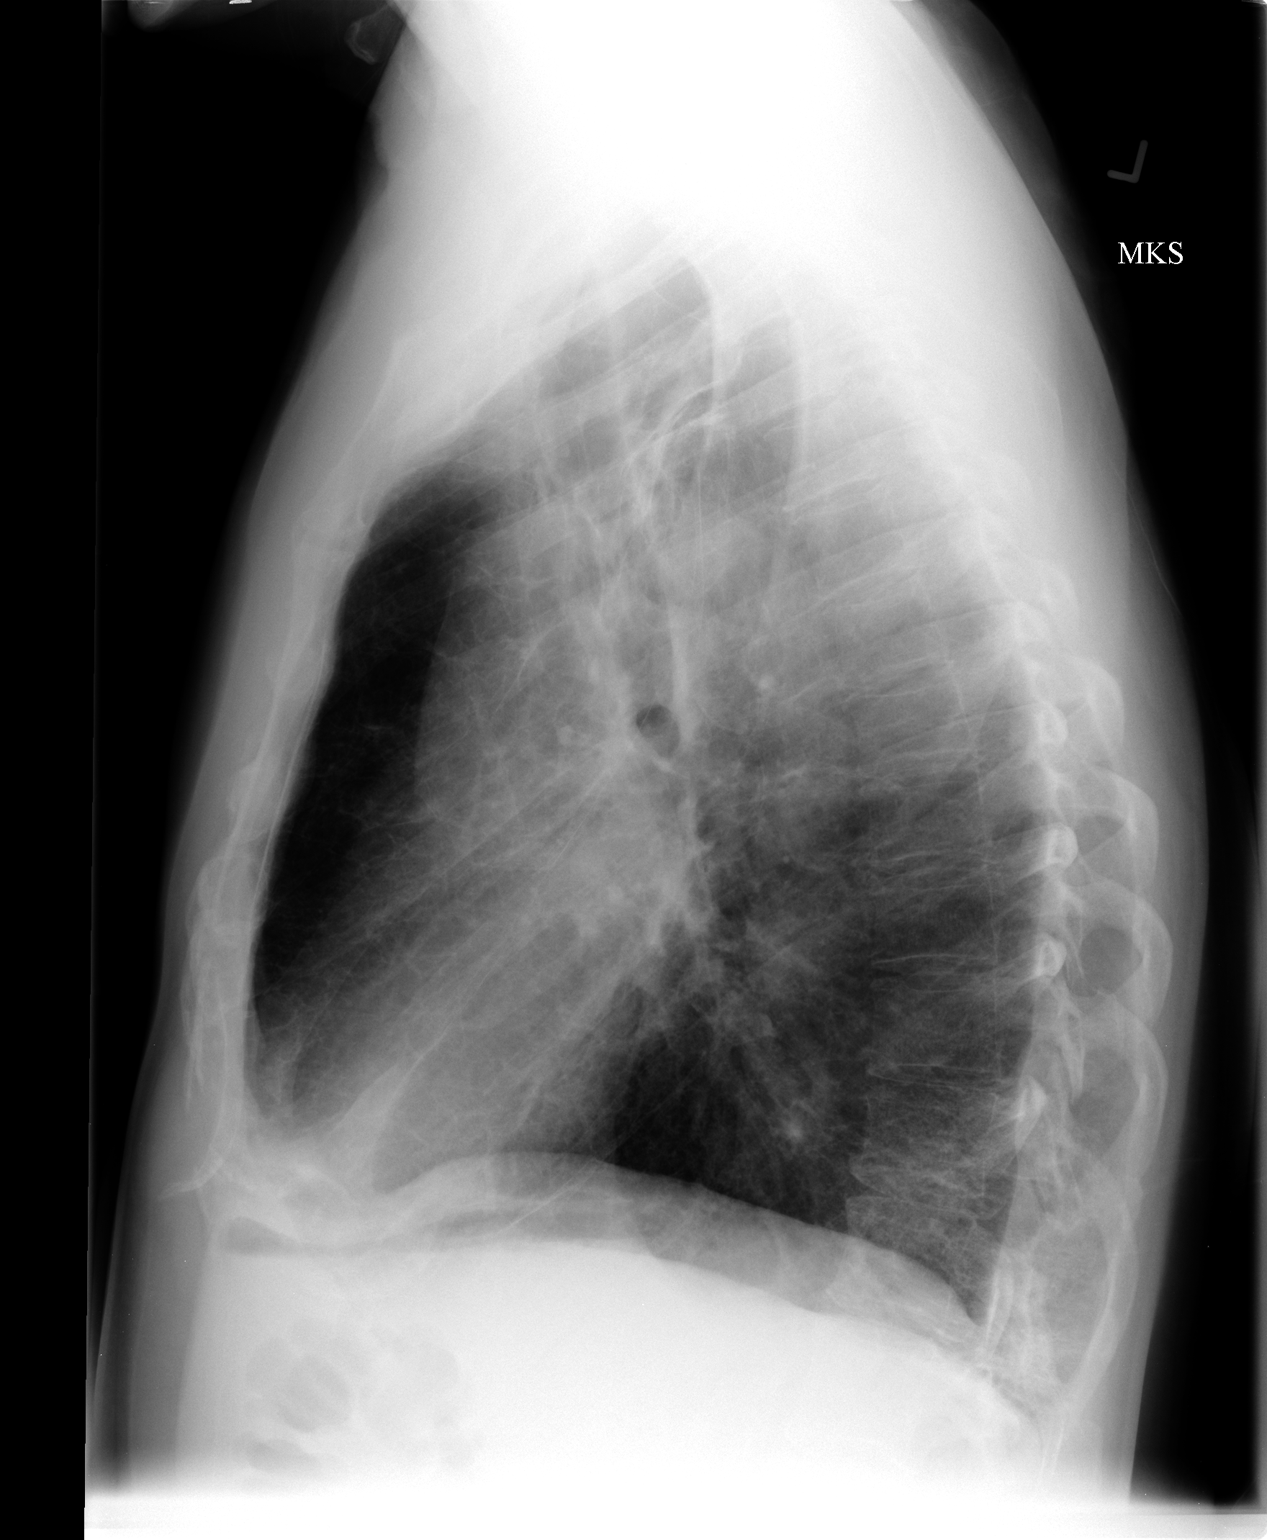

[2 of 2 positions shown; findings below may reference images not displayed]

FINDINGS: Midline trachea.  Normal heart size.  Left suprahilar
soft tissue fullness again suspected. No pleural effusion or
pneumothorax.

Similar appearance of the posterior left upper lobe cavitary
process.  Underlying emphysema.  Lower lobe predominant
interstitial thickening.  Mild left greater than right bibasilar
scarring.
IMPRESSION: No significant change in left upper lobe cavitary process and
adjacent left hilar/suprahilar adenopathy.  Please see prior CT
reports, including 12/21/2012, for further description.

## 2014-05-07 IMAGING — CR DG CHEST 2V
2 series · 2 of 2 positions shown · non-contrast
Comparison: 01/18/2013, 12/21/2012

CLINICAL DATA: Chest pain, chronic shortness of breath, COPD,
smoker

CHEST - 2 VIEW

[view not recorded (1 of 2)]
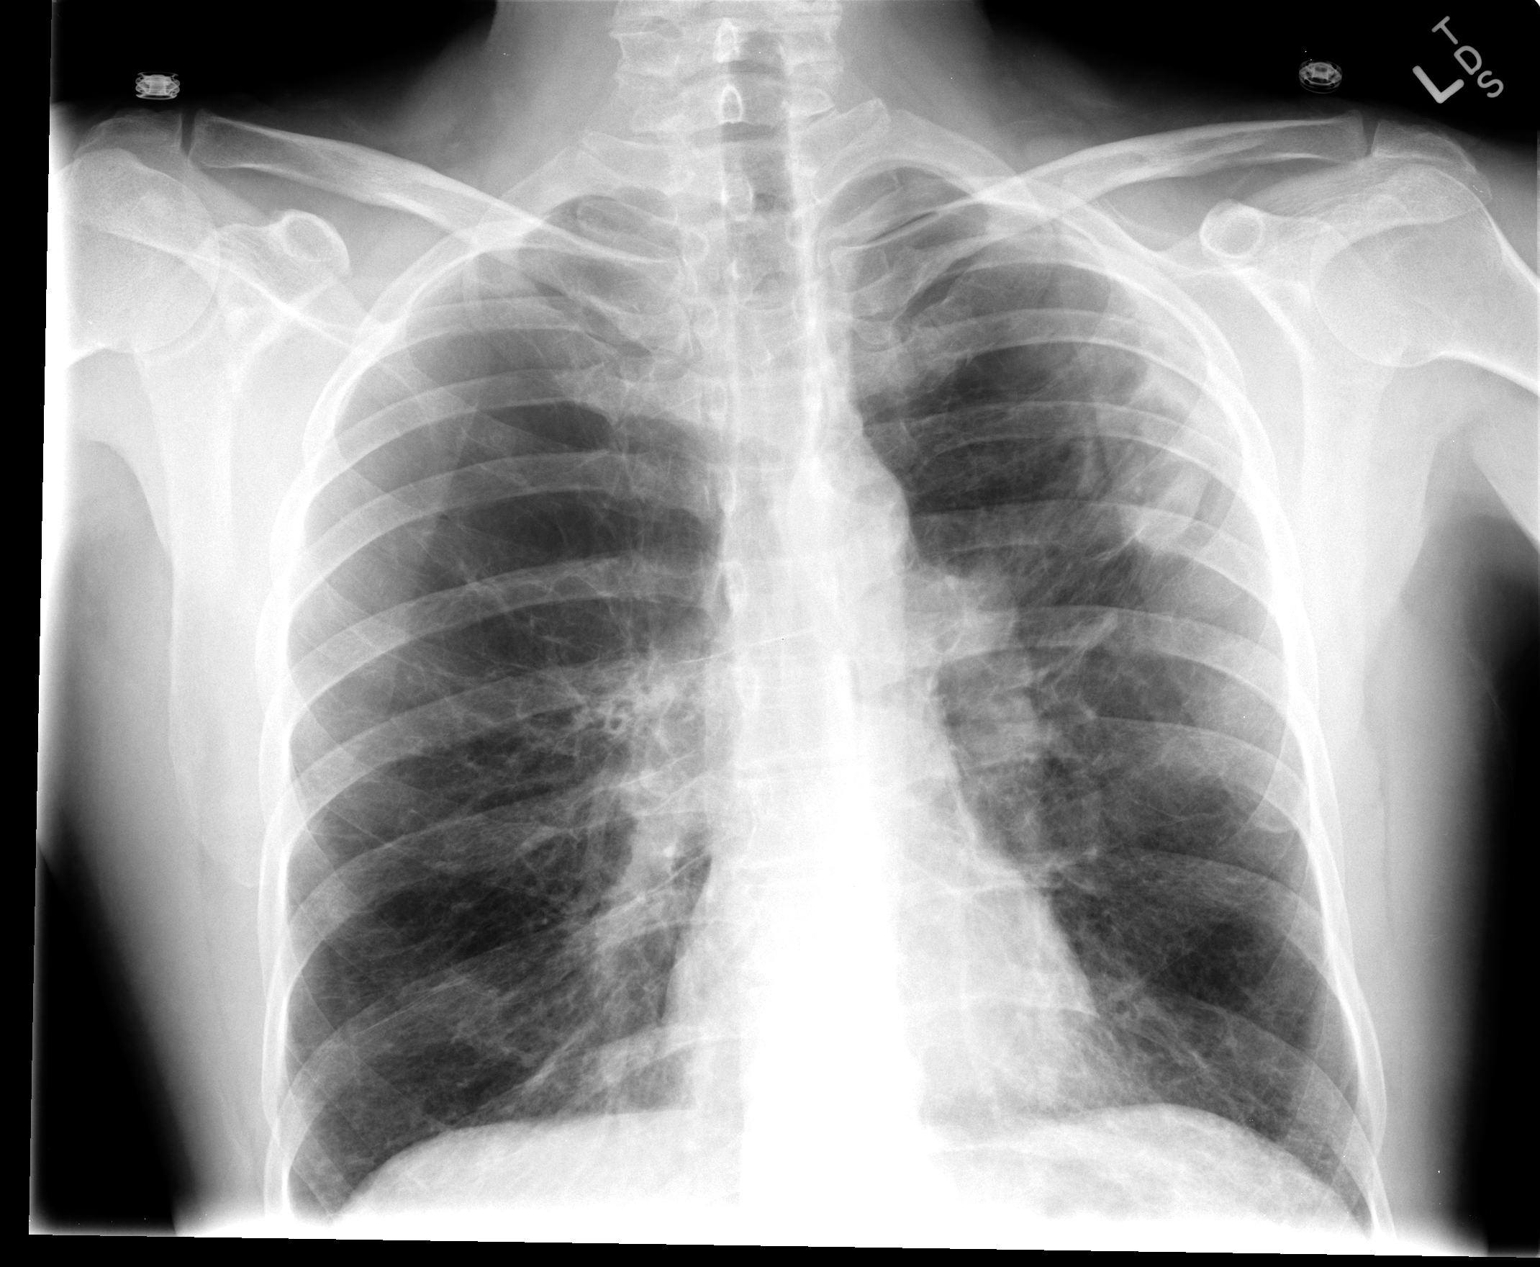

[view not recorded (2 of 2)]
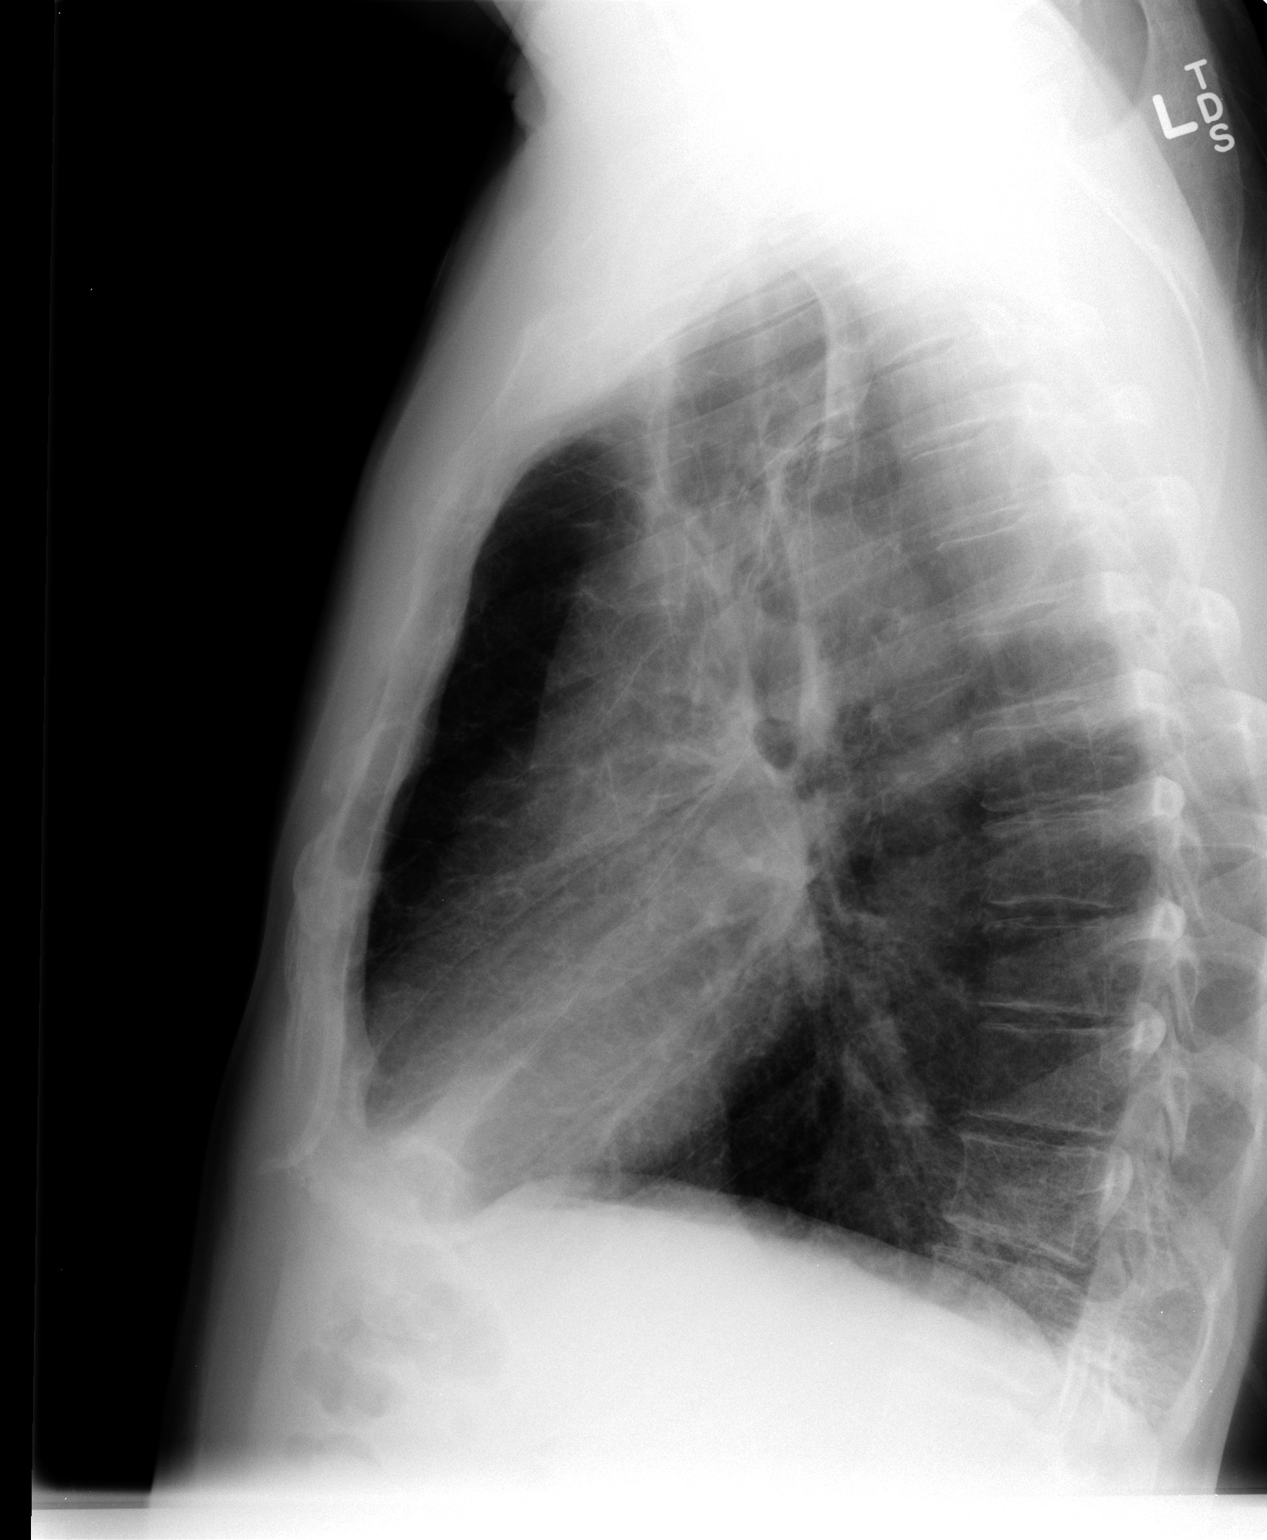

[2 of 2 positions shown; findings below may reference images not displayed]

FINDINGS: Severe advanced bullous emphysema.  Normal heart size and
vascularity.  Stable left upper lobe cavitary mass and left hilar
adenopathy.  No superimposed pneumonia, edema, large effusion, or
pneumothorax.  Trachea midline.
IMPRESSION: Advance severe bullous emphysema

Stable Left upper lobe cavitary mass and hilar adenopathy

No superimposed acute process

## 2014-05-08 ENCOUNTER — Emergency Department (HOSPITAL_COMMUNITY)
Admission: EM | Admit: 2014-05-08 | Discharge: 2014-05-08 | Disposition: A | Discharge status: Home / Self Care | Payer: Medicare Other | Attending: Emergency Medicine | Admitting: Emergency Medicine

## 2014-05-08 ENCOUNTER — Emergency Department (HOSPITAL_COMMUNITY): Payer: Medicare Other

## 2014-05-08 ENCOUNTER — Encounter (HOSPITAL_COMMUNITY): Payer: Self-pay | Admitting: Emergency Medicine

## 2014-05-08 DIAGNOSIS — Z79899 Other long term (current) drug therapy: Secondary | ICD-10-CM | POA: Insufficient documentation

## 2014-05-08 DIAGNOSIS — Z7951 Long term (current) use of inhaled steroids: Secondary | ICD-10-CM | POA: Diagnosis not present

## 2014-05-08 DIAGNOSIS — Z7982 Long term (current) use of aspirin: Secondary | ICD-10-CM | POA: Insufficient documentation

## 2014-05-08 DIAGNOSIS — Z8669 Personal history of other diseases of the nervous system and sense organs: Secondary | ICD-10-CM | POA: Diagnosis not present

## 2014-05-08 DIAGNOSIS — Z862 Personal history of diseases of the blood and blood-forming organs and certain disorders involving the immune mechanism: Secondary | ICD-10-CM | POA: Diagnosis not present

## 2014-05-08 DIAGNOSIS — E119 Type 2 diabetes mellitus without complications: Secondary | ICD-10-CM | POA: Diagnosis not present

## 2014-05-08 DIAGNOSIS — I2510 Atherosclerotic heart disease of native coronary artery without angina pectoris: Secondary | ICD-10-CM | POA: Diagnosis not present

## 2014-05-08 DIAGNOSIS — Z8701 Personal history of pneumonia (recurrent): Secondary | ICD-10-CM | POA: Diagnosis not present

## 2014-05-08 DIAGNOSIS — R0602 Shortness of breath: Secondary | ICD-10-CM

## 2014-05-08 DIAGNOSIS — Z8719 Personal history of other diseases of the digestive system: Secondary | ICD-10-CM | POA: Diagnosis not present

## 2014-05-08 DIAGNOSIS — Z7952 Long term (current) use of systemic steroids: Secondary | ICD-10-CM | POA: Insufficient documentation

## 2014-05-08 DIAGNOSIS — G8929 Other chronic pain: Secondary | ICD-10-CM | POA: Diagnosis not present

## 2014-05-08 DIAGNOSIS — Z720 Tobacco use: Secondary | ICD-10-CM | POA: Insufficient documentation

## 2014-05-08 DIAGNOSIS — R05 Cough: Secondary | ICD-10-CM | POA: Diagnosis not present

## 2014-05-08 DIAGNOSIS — J441 Chronic obstructive pulmonary disease with (acute) exacerbation: Secondary | ICD-10-CM

## 2014-05-08 DIAGNOSIS — Z792 Long term (current) use of antibiotics: Secondary | ICD-10-CM | POA: Insufficient documentation

## 2014-05-08 LAB — URINALYSIS, ROUTINE W REFLEX MICROSCOPIC
Bilirubin Urine: NEGATIVE
GLUCOSE, UA: NEGATIVE mg/dL
HGB URINE DIPSTICK: NEGATIVE
Ketones, ur: NEGATIVE mg/dL
Leukocytes, UA: NEGATIVE
Nitrite: NEGATIVE
PH: 5.5 (ref 5.0–8.0)
PROTEIN: NEGATIVE mg/dL
Specific Gravity, Urine: 1.01 (ref 1.005–1.030)
Urobilinogen, UA: 0.2 mg/dL (ref 0.0–1.0)

## 2014-05-08 LAB — CBC WITH DIFFERENTIAL/PLATELET
BASOS PCT: 0 % (ref 0–1)
Basophils Absolute: 0 10*3/uL (ref 0.0–0.1)
EOS ABS: 0.1 10*3/uL (ref 0.0–0.7)
Eosinophils Relative: 1 % (ref 0–5)
HCT: 39.8 % (ref 39.0–52.0)
HEMOGLOBIN: 13.5 g/dL (ref 13.0–17.0)
LYMPHS ABS: 1.7 10*3/uL (ref 0.7–4.0)
Lymphocytes Relative: 22 % (ref 12–46)
MCH: 27 pg (ref 26.0–34.0)
MCHC: 33.9 g/dL (ref 30.0–36.0)
MCV: 79.6 fL (ref 78.0–100.0)
MONOS PCT: 8 % (ref 3–12)
Monocytes Absolute: 0.6 10*3/uL (ref 0.1–1.0)
NEUTROS PCT: 69 % (ref 43–77)
Neutro Abs: 5.4 10*3/uL (ref 1.7–7.7)
PLATELETS: 225 10*3/uL (ref 150–400)
RBC: 5 MIL/uL (ref 4.22–5.81)
RDW: 14.3 % (ref 11.5–15.5)
WBC: 7.9 10*3/uL (ref 4.0–10.5)

## 2014-05-08 LAB — BASIC METABOLIC PANEL
Anion gap: 11 (ref 5–15)
BUN: 11 mg/dL (ref 6–23)
CALCIUM: 8.4 mg/dL (ref 8.4–10.5)
CO2: 25 meq/L (ref 19–32)
Chloride: 97 mEq/L (ref 96–112)
Creatinine, Ser: 0.99 mg/dL (ref 0.50–1.35)
GFR calc Af Amer: >90 mL/min (ref >90)
GLUCOSE: 133 mg/dL — AB (ref 70–99)
POTASSIUM: 4.1 meq/L (ref 3.7–5.3)
SODIUM: 133 meq/L — AB (ref 137–147)

## 2014-05-08 MED ORDER — ALBUTEROL (5 MG/ML) CONTINUOUS INHALATION SOLN
10.0000 mg/h | INHALATION_SOLUTION | RESPIRATORY_TRACT | Status: AC
  Administered 2014-05-08: 10 mg/h via RESPIRATORY_TRACT
  Filled 2014-05-08: qty 20

## 2014-05-08 MED ORDER — LEVOFLOXACIN IN D5W 750 MG/150ML IV SOLN
750.0000 mg | Freq: Once | INTRAVENOUS | Status: AC
  Administered 2014-05-08: 750 mg via INTRAVENOUS
  Filled 2014-05-08: qty 150

## 2014-05-08 MED ORDER — LEVOFLOXACIN 750 MG PO TABS: 750.0000 mg | ORAL_TABLET | Freq: Every day | ORAL | Status: DC

## 2014-05-08 MED ORDER — PREDNISONE 20 MG PO TABS
40.0000 mg | ORAL_TABLET | Freq: Once | ORAL | Status: AC
  Administered 2014-05-08: 40 mg via ORAL
  Filled 2014-05-08: qty 2

## 2014-05-08 MED ORDER — TRAMADOL HCL 50 MG PO TABS: 50.0000 mg | ORAL_TABLET | Freq: Four times a day (QID) | ORAL | Status: DC | PRN

## 2014-05-08 MED ORDER — PREDNISONE 20 MG PO TABS: 40.0000 mg | ORAL_TABLET | Freq: Every day | ORAL | Status: DC

## 2014-05-08 MED ORDER — MORPHINE SULFATE 4 MG/ML IJ SOLN
4.0000 mg | Freq: Once | INTRAMUSCULAR | Status: AC
  Administered 2014-05-08: 4 mg via INTRAVENOUS
  Filled 2014-05-08: qty 1

## 2014-05-08 NOTE — ED Notes (Signed)
PT ambulated on 2L O2 around nursing station and maintained sats >89%. ER MD made aware. PT stated he felt much better walking after the breathing tx then this am.

## 2014-05-08 NOTE — ED Notes (Signed)
MD at bedside. 

## 2014-05-08 NOTE — ED Notes (Signed)
States he feels much better, is back to his baseline respiratory status. Would like a prescription for pain medication "just in case" he gets pain from coughing. ERMD aware

## 2014-05-08 NOTE — Discharge Instructions (Signed)
Please call your doctor for a followup appointment within 24-48 hours. When you talk to your doctor please let them know that you were seen in the emergency department and have them acquire all of your records so that they can discuss the findings with you and formulate a treatment plan to fully care for your new and ongoing problems.   Prednisone once daily for 5 days  Levaquin once daily for 7 days  Albuterol inhaler or nebulizer every 4 hours for 24 hours, then every 4 hours as needed!

## 2014-05-08 NOTE — ED Notes (Signed)
Respiratory paged and made aware of neb order.

## 2014-05-08 NOTE — ED Provider Notes (Signed)
CSN: [id number]     Arrival date & time 05/08/14  1550 History  This chart was scribed for Ryan Acosta, MD by Rayfield Citizen, ED Scribe. This patient was seen in room APA02/APA02 and the patient's care was started at 4:07 PM.    Chief Complaint  Patient presents with  . Shortness of Breath   The history is provided by the patient. No language interpreter was used.    HPI Comments: Ryan [name] is a 53 y.o. male who presents to the Emergency Department complaining of SOB and productive cough (thick, yellow sputum). He notes associated subjective fever and cold chills. His cough is painful; he has left-sided chest pain and general back pain. He reports slight trouble with urination; he feels the urge but cannot go. He denies trouble with bowel movements or swelling in his legs. Patient explains that he recently traveled out of state and he "caught a virus"; he was taking OTC cold medications and some left over prescriptions to manage his symptoms. Patient's wife explains that she went to the doctor for her symptoms and was diagnosed with bronchitis.   He has pain in his knee caps.   He is a current smoker with a history of COPD. He is prescribed home oxygen (2L/min) but recently went up to 2.5 to relieve his current symptoms.  He has not had recent admission to hospital or prednisone use.  His SOB and cough has become acutely worse the last 3 days   Past Medical History  Diagnosis Date  . COPD (chronic obstructive pulmonary disease)   . Hypercholesteremia   . History of pneumonia   . Polycythemia   . Coronary atherosclerosis of native coronary artery     Mild nonobstructive disease at catheterization 2007  . 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 Borderline diabetes   . Seizures     Last seizure 2 yrs ago  . GERD (gastroesophageal reflux disease)   . Type 2 diabetes mellitus   . DDD (degenerative disc disease)     Cervical and thoracic  . Chronic  back pain   . Bronchitis   . Chronic left shoulder pain   . Chronic neck pain   . Atrial fibrillation     Not anticoagulated  . Smoker    Past Surgical History  Procedure Laterality Date  . Colonoscopy w/ endoscopic [place]    . Vasectomy    . Throat biopsy    . Lung biopsy    . Vasectomy  1987   Family History  Problem Relation Age of Onset  . Hypertension Mother   . Diabetes Mother   . Heart attack Mother   . Heart failure Mother   . Hypertension Sister   . Diabetes Sister   . Heart failure Sister    History  Substance Use Topics  . Smoking status: Current Every Day Smoker -- 0.50 packs/day for 45 years    Types: Cigarettes  . Smokeless tobacco: Never Used     Comment: started smoking half pack 2 days ago per stopped in the hospital as of 07-20-2013  . Alcohol Use: No    Review of Systems  Constitutional: Positive for fever and chills.  Respiratory: Positive for cough and shortness of breath.   Cardiovascular: Negative for leg swelling.  All other systems reviewed and are negative.   Allergies  Influenza vaccine live  Home Medications   Prior to Admission medications   Medication  Sig Start Date End Date Taking? Authorizing Provider  albuterol (PROVENTIL) (2.5 MG/3ML) 0.083% nebulizer solution Take 2.5 mg by nebulization every 6 (six) hours as needed for wheezing or shortness of breath.   Yes Historical Provider, MD  albuterol (VENTOLIN HFA) 108 (90 BASE) MCG/ACT inhaler Inhale 2 puffs into the lungs every 4 (four) hours as needed. Shortness of breath   Yes Historical Provider, MD  aspirin EC 81 MG tablet Take 81 mg by mouth daily.   Yes Historical Provider, MD  budesonide-formoterol (SYMBICORT) 80-4.5 MCG/ACT inhaler Inhale 2 puffs into the lungs 2 (two) times daily. 10/21/13  Yes Charlynne Cousins, MD  fluticasone [facility]) 50 MCG/ACT nasal spray Place 2 sprays into both nostrils daily.   Yes Historical Provider, MD  Fluticasone-Salmeterol (ADVAIR DISKUS) 250-50  MCG/DOSE AEPB Inhale 1 puff into the lungs every 12 (twelve) hours.    Yes Historical Provider, MD  nitroGLYCERIN (NITROSTAT) 0.4 MG SL tablet Place 1 tablet (0.4 mg total) under the tongue every 5 (five) minutes as needed for chest pain. 04/22/13  Yes Lendon Colonel, NP  tiotropium (SPIRIVA) 18 MCG inhalation capsule Place 18 mcg into inhaler and inhale daily.   Yes Historical Provider, MD  levofloxacin (LEVAQUIN) 750 MG tablet Take 1 tablet (750 mg total) by mouth daily. 05/08/14   Ryan Acosta, MD  predniSONE (DELTASONE) 20 MG tablet Take 2 tablets (40 mg total) by mouth daily. 05/08/14   Ryan Acosta, MD   BP 121/74  Pulse 111  Temp(Src) 98.3 F (36.8 C) (Oral)  Resp 32  Ht 5\' 8"  (1.727 m)  Wt 140 lb (63.504 kg)  BMI 21.29 kg/m2  SpO2 100% Physical Exam  Nursing note and vitals reviewed. Constitutional: He is oriented to person, place, and time. He appears well-developed and well-nourished.  HENT:  Head: Normocephalic and atraumatic.  Mouth/Throat: Oropharynx is clear and moist. No oropharyngeal exudate.  Eyes: Conjunctivae and EOM are normal. Pupils are equal, round, and reactive to light. Right eye exhibits no discharge. Left eye exhibits no discharge. No scleral icterus.  Neck: Normal range of motion. Neck supple. No JVD present. No thyromegaly present.  Cardiovascular: Normal rate, regular rhythm, normal heart sounds and intact distal pulses.  Exam reveals no gallop and no friction rub.   No murmur heard. Pulmonary/Chest: Effort normal. No respiratory distress. He has wheezes. He has no rales.  Decreased lung sounds and expiratory wheezing bilaterally; no rales Increased WOB, prolonged exp phase.  Mild tachypnea.  Speaks in shortened sentences.  Abdominal: Soft. Bowel sounds are normal. He exhibits no distension and no mass. There is no tenderness. There is no rebound and no guarding.  Musculoskeletal: Normal range of motion. He exhibits no edema and no tenderness.   Lymphadenopathy:    He has no cervical adenopathy.  Neurological: He is alert and oriented to person, place, and time. Coordination normal.  Skin: Skin is warm and dry. No rash noted. No erythema.  Psychiatric: He has a normal mood and affect. His behavior is normal.    ED Course  Procedures   DIAGNOSTIC STUDIES: Oxygen Saturation is 98% on 2L/min, normal by my interpretation.    COORDINATION OF CARE: 4:14 PM Discussed treatment plan with pt at bedside and pt agreed to plan.   Labs Review Labs Reviewed  BASIC METABOLIC PANEL - Abnormal; Notable for the following:    Sodium 133 (*)    Glucose, Bld 133 (*)    All other components within normal limits  CBC WITH DIFFERENTIAL  URINALYSIS, ROUTINE W REFLEX MICROSCOPIC    Imaging Review Dg Chest 2 View  05/08/2014   CLINICAL DATA:  Productive cough for 3 weeks  EXAM: CHEST  2 VIEW  COMPARISON:  12/15/2013  FINDINGS: Cardiac shadow is within normal limits. Stable interstitial changes are noted throughout both lungs. The known left upper lobe cavitary lesion is again seen. The wall appears slightly less prominent. No new focal infiltrate or effusion is noted. No acute bony abnormality is seen.  IMPRESSION: Chronic changes without acute abnormality.  Stable left upper lobe cavitary lesion.   Electronically Signed   By: Inez Catalina M.D.   On: 05/08/2014 16:52      MDM   Final diagnoses:  Shortness of breath  COPD exacerbation    The pt has severe COPD and emphysema - he has ongoing SOb and cough and appears in mild distress for which he will need prednisone, albuterol continuous and likely Abx after xray.  Labs.    COPD exacerbation, patient improved significantly after continuous nebulizer therapy and prednisone. Levaquin given as well. His chest x-ray shows no acute new findings, his labs show no leukocytosis and his oxygenation has remained adequate. His respiratory rate has decreased significantly at rest. When the patient  ambulates his oxygen drops to the mid 80% but returns back to his 92% on his baseline 2 L at rest. I have discussed with the patient at length indications for return and the need for close follow-up and he has expressed his understanding.   Meds given in ED:  Medications  albuterol (PROVENTIL,VENTOLIN) solution continuous neb (0 mg/hr Nebulization Stopped 05/08/14 1745)  predniSONE (DELTASONE) tablet 40 mg (40 mg Oral Given 05/08/14 1626)  levofloxacin (LEVAQUIN) IVPB 750 mg (0 mg Intravenous Stopped 05/08/14 1843)  morphine 4 MG/ML injection 4 mg (4 mg Intravenous Given 05/08/14 1735)    New Prescriptions   LEVOFLOXACIN (LEVAQUIN) 750 MG TABLET    Take 1 tablet (750 mg total) by mouth daily.   PREDNISONE (DELTASONE) 20 MG TABLET    Take 2 tablets (40 mg total) by mouth daily.       I personally performed the services described in this documentation, which was scribed in my presence. The recorded information has been reviewed and is accurate.       Ryan Acosta, MD 05/08/14 [phone]

## 2014-05-08 NOTE — ED Notes (Signed)
Sob, cough with yellow brown sputum,  Has been taking neb treatments. Thinks he has had a fever.  On 2.5 L 02.

## 2014-05-29 ENCOUNTER — Ambulatory Visit (INDEPENDENT_AMBULATORY_CARE_PROVIDER_SITE_OTHER): Payer: Medicare Other | Admitting: Physician Assistant

## 2014-05-29 ENCOUNTER — Encounter: Payer: Self-pay | Admitting: Physician Assistant

## 2014-05-29 VITALS — BP 102/58 | HR 100 | Temp 98.0°F | Resp 20 | Ht 66.0 in | Wt 135.0 lb

## 2014-05-29 DIAGNOSIS — E1165 Type 2 diabetes mellitus with hyperglycemia: Secondary | ICD-10-CM | POA: Diagnosis not present

## 2014-05-29 DIAGNOSIS — J439 Emphysema, unspecified: Secondary | ICD-10-CM

## 2014-05-29 DIAGNOSIS — I2510 Atherosclerotic heart disease of native coronary artery without angina pectoris: Secondary | ICD-10-CM

## 2014-05-29 DIAGNOSIS — E785 Hyperlipidemia, unspecified: Secondary | ICD-10-CM | POA: Diagnosis not present

## 2014-05-29 DIAGNOSIS — Z720 Tobacco use: Secondary | ICD-10-CM

## 2014-05-29 DIAGNOSIS — F17200 Nicotine dependence, unspecified, uncomplicated: Secondary | ICD-10-CM

## 2014-05-29 DIAGNOSIS — E119 Type 2 diabetes mellitus without complications: Secondary | ICD-10-CM | POA: Insufficient documentation

## 2014-05-29 LAB — HM DIABETES FOOT EXAM: HM DIABETIC FOOT EXAM: NORMAL

## 2014-05-29 LAB — HEMOGLOBIN A1C, FINGERSTICK: Hgb A1C (fingerstick): 6.4 % — ABNORMAL HIGH (ref <5.7)

## 2014-05-29 LAB — MICROALBUMIN, URINE: Microalb, Ur: 0.6 mg/dL (ref <2.0)

## 2014-05-29 NOTE — Progress Notes (Signed)
Patient ID: Ryan [name] MRN: [id number], DOB: 1961-02-21, 53 y.o. Date of Encounter: @DATE @  Chief Complaint:  Chief Complaint  Patient presents with  . routine check up    forms for DME  . Flu Vaccine    has question about being truely allergic    HPI: 53 y.o. year old white male  presents for routine office visit.  He says that he "doesn't come in unless he is sick." However, we recently got forms for Medicare and he needed to be seen in order for them to continue to provide supplies. Therefore he scheduled visit.  He has a history of diabetes but has not required medications for this. It has been controlled with diet and exercise so far. He says that he does check his blood sugar about 2 times per week fasting and always gets around 100 or 102.  He continues to smoke and is smoking about 1-1/2 packs per day currently.  He has no specific complaints today.   Past Medical History  Diagnosis Date  . COPD (chronic obstructive pulmonary disease)   . Hypercholesteremia   . History of pneumonia   . Polycythemia   . Coronary atherosclerosis of native coronary artery     Mild nonobstructive disease at catheterization 2007  . 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 Borderline diabetes   . Seizures     Last seizure 2 yrs ago  . GERD (gastroesophageal reflux disease)   . Type 2 diabetes mellitus   . DDD (degenerative disc disease)     Cervical and thoracic  . Chronic back pain   . Bronchitis   . Chronic left shoulder pain   . Chronic neck pain   . Atrial fibrillation     Not anticoagulated  . Smoker      Home Meds: Outpatient Prescriptions Prior to Visit  Medication Sig Dispense Refill  . albuterol (PROVENTIL) (2.5 MG/3ML) 0.083% nebulizer solution Take 2.5 mg by nebulization every 6 (six) hours as needed for wheezing or shortness of breath.    [name] albuterol (VENTOLIN HFA) 108 (90 BASE) MCG/ACT inhaler Inhale 2 puffs into the lungs  every 4 (four) hours as needed. Shortness of breath    . aspirin EC 81 MG tablet Take 81 mg by mouth daily.    . budesonide-formoterol (SYMBICORT) 80-4.5 MCG/ACT inhaler Inhale 2 puffs into the lungs 2 (two) times daily. 1 Inhaler 12  . fluticasone (FLONASE) 50 MCG/ACT nasal spray Place 2 sprays into both nostrils daily.    . Fluticasone-Salmeterol (ADVAIR DISKUS) 250-50 MCG/DOSE AEPB Inhale 1 puff into the lungs every 12 (twelve) hours.     . nitroGLYCERIN (NITROSTAT) 0.4 MG SL tablet Place 1 tablet (0.4 mg total) under the tongue every 5 (five) minutes as needed for chest pain. 25 tablet 4  . tiotropium (SPIRIVA) 18 MCG inhalation capsule Place 18 mcg into inhaler and inhale daily.    . traMADol (ULTRAM) 50 MG tablet Take 1 tablet (50 mg total) by mouth every 6 (six) hours as needed. 15 tablet 0  . levofloxacin (LEVAQUIN) 750 MG tablet Take 1 tablet (750 mg total) by mouth daily. 7 tablet 0  . predniSONE (DELTASONE) 20 MG tablet Take 2 tablets (40 mg total) by mouth daily. 10 tablet 0   No facility-administered medications prior to visit.    Allergies:  Allergies  Allergen Reactions  . Influenza Vaccine Live Swelling    History   Social  History  . Marital Status: Married    Spouse Name: [name]    Number of Children: 4  . Years of Education: 10   Occupational History  . Not on file.   Social History Main Topics  . Smoking status: Current Every Day Smoker -- 0.50 packs/day for 45 years    Types: Cigarettes  . Smokeless tobacco: Never Used     Comment: started smoking half pack 2 days ago per stopped in the hospital as of 07-20-2013  . Alcohol Use: No  . Drug Use: No  . Sexual Activity: Yes    Birth Control/ Protection: Surgical   Other Topics Concern  . Not on file   Social History Narrative   Patient is married [name]) and lives at home with his wife and one child.   Patient has four children.   Patient is disabled.   Patient has a high school education.   Patient is  left-handed.   Patient does not drink any caffeine.    Family History  Problem Relation Age of Onset  . Hypertension Mother   . Diabetes Mother   . Heart attack Mother   . Heart failure Mother   . Hypertension Sister   . Diabetes Sister   . Heart failure Sister      Review of Systems:  See HPI for pertinent ROS. All other ROS negative.    Physical Exam: Blood pressure 102/58, pulse 100, temperature 98 F (36.7 C), temperature source Oral, resp. rate 20, height 5\' 6"  (1.676 m), weight 135 lb (61.236 kg), SpO2 94 %., Body mass index is 21.8 kg/(m^2). General: Thin WM. Appears in no acute distress. Neck: Supple. No thyromegaly. No lymphadenopathy. Lungs: Distant, Decreased Breath Sounds but no wheezes.  Heart: RRR with S1 S2. No murmurs, rubs, or gallops. Abdomen: Soft, non-tender, non-distended with normoactive bowel sounds. No hepatomegaly. No rebound/guarding. No obvious abdominal masses. Musculoskeletal:  Strength and tone normal for age. Extremities/Skin: Warm and dry.  No edema.  Diabetic foot exam: inspection is normal. No areas of skin breakdown and no wounds. He has 2+ bilateral posterior tibial pulses. No palpable dorsalis pedis pulses bilaterally. Neuro: Alert and oriented X 3. Moves all extremities spontaneously. Gait is normal. CNII-XII grossly in tact. Psych:  Responds to questions appropriately with a normal affect.     ASSESSMENT AND PLAN:  53 y.o. year old male with  1. TOBACCO ABUSE  Discussed need for cessation. He says that he used Chantix in the past but it did not work for him. He says that he use Wellbutrin in the past and did quit smoking but then started back.--However he has a history of seizures so I do not want feel comfortable to prescribe Wellbutrin.  He says that he recently bought some nicotine patches and plans to start using these soon.  2. Pulmonary emphysema, unspecified emphysema type   3. Type 2 diabetes mellitus with  hyperglycemia  - Hemoglobin A1C, fingerstick - Microalbumin, urine  4. Hyperlipidemia He has had no recent lipid panel but he is not fasting today so we will have to wait for him to return to check this in the future.  Explained to him that he needs to have routine office visits and needs to have a follow-up visit with [place] in 3 months or sooner if needed.   Signed, [address] [place], [place], [facility] 05/29/2014 1:42 PM

## 2014-06-01 ENCOUNTER — Telehealth: Payer: Self-pay | Admitting: Physician Assistant

## 2014-06-01 ENCOUNTER — Encounter: Payer: Self-pay | Admitting: Family Medicine

## 2014-06-01 NOTE — Telephone Encounter (Signed)
Nell calling us medical calling to see if we received forms from them regarding a back brace  [phone] ref number [id number]

## 2014-06-02 NOTE — Telephone Encounter (Signed)
Forms were received and reviewed by provider.  Pt does not meet criteria for a back brace.  Form faxed back with denial.

## 2014-06-07 ENCOUNTER — Encounter: Payer: Self-pay | Admitting: Family Medicine

## 2014-06-15 DIAGNOSIS — M779 Enthesopathy, unspecified: Secondary | ICD-10-CM | POA: Diagnosis not present

## 2014-06-17 DIAGNOSIS — Z8679 Personal history of other diseases of the circulatory system: Secondary | ICD-10-CM | POA: Diagnosis not present

## 2014-06-17 DIAGNOSIS — E785 Hyperlipidemia, unspecified: Secondary | ICD-10-CM | POA: Diagnosis not present

## 2014-06-17 DIAGNOSIS — I480 Paroxysmal atrial fibrillation: Secondary | ICD-10-CM | POA: Diagnosis not present

## 2014-06-17 DIAGNOSIS — Z9981 Dependence on supplemental oxygen: Secondary | ICD-10-CM | POA: Diagnosis not present

## 2014-06-17 DIAGNOSIS — R42 Dizziness and giddiness: Secondary | ICD-10-CM | POA: Diagnosis not present

## 2014-06-17 DIAGNOSIS — E780 Pure hypercholesterolemia: Secondary | ICD-10-CM | POA: Diagnosis not present

## 2014-06-17 DIAGNOSIS — I4891 Unspecified atrial fibrillation: Secondary | ICD-10-CM | POA: Diagnosis not present

## 2014-06-17 DIAGNOSIS — Z8249 Family history of ischemic heart disease and other diseases of the circulatory system: Secondary | ICD-10-CM | POA: Diagnosis not present

## 2014-06-17 DIAGNOSIS — J449 Chronic obstructive pulmonary disease, unspecified: Secondary | ICD-10-CM | POA: Diagnosis not present

## 2014-06-17 DIAGNOSIS — I2510 Atherosclerotic heart disease of native coronary artery without angina pectoris: Secondary | ICD-10-CM | POA: Diagnosis not present

## 2014-06-17 DIAGNOSIS — E119 Type 2 diabetes mellitus without complications: Secondary | ICD-10-CM | POA: Diagnosis not present

## 2014-06-17 DIAGNOSIS — I361 Nonrheumatic tricuspid (valve) insufficiency: Secondary | ICD-10-CM | POA: Diagnosis not present

## 2014-06-17 DIAGNOSIS — R000 Tachycardia, unspecified: Secondary | ICD-10-CM | POA: Diagnosis not present

## 2014-06-17 DIAGNOSIS — F17200 Nicotine dependence, unspecified, uncomplicated: Secondary | ICD-10-CM | POA: Diagnosis not present

## 2014-06-17 DIAGNOSIS — Z955 Presence of coronary angioplasty implant and graft: Secondary | ICD-10-CM | POA: Diagnosis not present

## 2014-06-17 DIAGNOSIS — F17210 Nicotine dependence, cigarettes, uncomplicated: Secondary | ICD-10-CM | POA: Diagnosis present

## 2014-06-26 ENCOUNTER — Ambulatory Visit (INDEPENDENT_AMBULATORY_CARE_PROVIDER_SITE_OTHER): Payer: Medicare Other | Admitting: Adult Health

## 2014-06-26 ENCOUNTER — Encounter: Payer: Self-pay | Admitting: Adult Health

## 2014-06-26 VITALS — BP 118/72 | HR 101 | Ht 68.0 in | Wt 134.0 lb

## 2014-06-26 DIAGNOSIS — I2510 Atherosclerotic heart disease of native coronary artery without angina pectoris: Secondary | ICD-10-CM | POA: Diagnosis not present

## 2014-06-26 DIAGNOSIS — I4891 Unspecified atrial fibrillation: Secondary | ICD-10-CM | POA: Diagnosis not present

## 2014-06-26 DIAGNOSIS — Z79899 Other long term (current) drug therapy: Secondary | ICD-10-CM | POA: Diagnosis not present

## 2014-06-26 DIAGNOSIS — I10 Essential (primary) hypertension: Secondary | ICD-10-CM | POA: Diagnosis not present

## 2014-06-26 MED ORDER — DILTIAZEM HCL ER COATED BEADS 180 MG PO CP24
180.0000 mg | ORAL_CAPSULE | Freq: Every day | ORAL | Status: DC
Start: 2014-06-26 — End: 2014-08-01

## 2014-06-26 MED ORDER — EDOXABAN TOSYLATE 60 MG PO TABS: 60.0000 mg | ORAL_TABLET | Freq: Every day | ORAL | Status: DC

## 2014-06-26 NOTE — Assessment & Plan Note (Signed)
This is his 4 th documented recurrence of atrial fib. I will begin him on Savaysa 60 mg daily. Last GFR >90. Creatinine 0.99. I will give him refills on diltiazem 180 mg daily. He has been given samples of the Savaysa.  I will check BMET and CBC in a couple of weeks. See him in one month. He is to call if he has issues with bleeding. He verbalizes understanding.  In the interim, I will get records from [facility] to review concerning treatment course while hospitalized along with echo results.

## 2014-06-26 NOTE — Patient Instructions (Addendum)
Your physician recommends that you schedule a follow-up appointment in: 1 month  Your physician recommends that you continue on your current medications as directed. Please refer to the Current Medication list given to you today.  START SAVAYSA 60 MG DAILY WE HAVE GIVEN YOU SAMPLES.   Your physician recommends that you return for lab work in [place] BMP/CBC  [place]   Thank you for choosing [facility]!!

## 2014-06-26 NOTE — Progress Notes (Deleted)
Name: Ryan [name]    DOB: 1961-03-22  Age: 53 y.o.  MR#: [id number]       PCP:  [name], PA-C      Insurance: Payor: MEDICARE / Plan: MEDICARE PART A AND B / Product Type: *No Product type* /   CC:   No chief complaint on file.   VS Filed Vitals:   06/26/14 1435  BP: 118/72  Pulse: 101  Height: 5\' 8"  (1.727 m)  Weight: 134 lb (60.782 kg)    Weights Current Weight  06/26/14 134 lb (60.782 kg)  05/29/14 135 lb (61.236 kg)  05/08/14 140 lb (63.504 kg)    Blood Pressure  BP Readings from Last 3 Encounters:  06/26/14 118/72  05/29/14 102/58  05/08/14 119/76     Admit date:  (Not on file) Last encounter with RMR:  Visit date not found   Allergy Influenza vaccine live  Current Outpatient Prescriptions  Medication Sig Dispense Refill  . albuterol (PROVENTIL) (2.5 MG/3ML) 0.083% nebulizer solution Take 2.5 mg by nebulization every 6 (six) hours as needed for wheezing or shortness of breath.    [name] albuterol (VENTOLIN HFA) 108 (90 BASE) MCG/ACT inhaler Inhale 2 puffs into the lungs every 4 (four) hours as needed. Shortness of breath    . aspirin EC 81 MG tablet Take 81 mg by mouth daily.    . budesonide-formoterol (SYMBICORT) 80-4.5 MCG/ACT inhaler Inhale 2 puffs into the lungs 2 (two) times daily. 1 Inhaler 12  . diltiazem (CARDIZEM CD) 180 MG 24 hr capsule Take 180 mg by mouth daily.   0  . fluticasone (FLONASE) 50 MCG/ACT nasal spray Place 2 sprays into both nostrils daily.    . Fluticasone-Salmeterol (ADVAIR DISKUS) 250-50 MCG/DOSE AEPB Inhale 1 puff into the lungs every 12 (twelve) hours.     . nitroGLYCERIN (NITROSTAT) 0.4 MG SL tablet Place 1 tablet (0.4 mg total) under the tongue every 5 (five) minutes as needed for chest pain. 25 tablet 4  . tiotropium (SPIRIVA) 18 MCG inhalation capsule Place 18 mcg into inhaler and inhale daily.     No current facility-administered medications for this visit.    Discontinued Meds:    Medications Discontinued During This  Encounter  Medication Reason  . naproxen (NAPROSYN) 500 MG tablet Error  . metFORMIN (GLUCOPHAGE) 500 MG tablet Error  . traMADol (ULTRAM) 50 MG tablet Error    Patient Active Problem List   Diagnosis Date Noted  . Diabetes 05/29/2014  . Pain in joint, lower leg 12/28/2013  . Muscle spasm of both lower legs 12/28/2013  . Protein-calorie malnutrition, severe 10/21/2013  . Hemoptysis 10/20/2013  . Atrial fibrillation with rapid ventricular response 07/10/2013  . CAP (community acquired pneumonia) 07/10/2013  . Shoulder pain, left 01/20/2012  . Neck pain on left side 01/20/2012  . Rotator cuff syndrome of left shoulder 01/20/2012  . Spondylosis, cervical 11/19/2011  . Shoulder pain 11/19/2011  . Acute respiratory failure with hypoxia 06/23/2011  . Pulmonary cavitary lesion 06/23/2011  . SOB (shortness of breath) 06/22/2011  . Nausea vomiting and diarrhea 06/22/2011  . Dehydration 06/22/2011  . Cough 06/22/2011  . Decompensated COPD with exacerbation (chronic obstructive pulmonary disease) 06/22/2011  . Abdominal pain 06/22/2011  . 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 Cavitary lesion of lung 05/07/2011  . GERD with stricture   . ASCVD (arteriosclerotic cardiovascular disease)   . Glucose intolerance (pre-diabetes)   . TOBACCO ABUSE 05/17/2010  . ASTHMA 01/20/2008  . Hyperlipidemia 12/27/2007  .  COPD (chronic obstructive pulmonary disease) 12/27/2007    LABS    Component Value Date/Time   NA 133* 05/08/2014 1623   NA 137 01/16/2014 0832   NA 133* 10/31/2013 0152   K 4.1 05/08/2014 1623   K 4.2 01/16/2014 0832   K 3.9 10/31/2013 0152   CL 97 05/08/2014 1623   CL 97 01/16/2014 0832   CL 97 10/31/2013 0152   CO2 25 05/08/2014 1623   CO2 27 01/16/2014 0832   CO2 21 10/31/2013 0152   GLUCOSE 133* 05/08/2014 1623   GLUCOSE 157* 01/16/2014 0832   GLUCOSE 118* 10/31/2013 0152   BUN 11 05/08/2014 1623   BUN 8 01/16/2014 0832   BUN 12 10/31/2013 0152    CREATININE 0.99 05/08/2014 1623   CREATININE 0.99 01/16/2014 0832   CREATININE 1.07 10/31/2013 0152   CALCIUM 8.4 05/08/2014 1623   CALCIUM 8.9 01/16/2014 0832   CALCIUM 8.3* 10/31/2013 0152   GFRNONAA >90 05/08/2014 1623   GFRNONAA >90 01/16/2014 0832   GFRNONAA 77* 10/31/2013 0152   GFRAA >90 05/08/2014 1623   GFRAA >90 01/16/2014 0832   GFRAA 89* 10/31/2013 0152   CMP     Component Value Date/Time   NA 133* 05/08/2014 1623   K 4.1 05/08/2014 1623   CL 97 05/08/2014 1623   CO2 25 05/08/2014 1623   GLUCOSE 133* 05/08/2014 1623   BUN 11 05/08/2014 1623   CREATININE 0.99 05/08/2014 1623   CALCIUM 8.4 05/08/2014 1623   PROT 6.8 10/21/2013 0503   ALBUMIN 2.8* 10/21/2013 0503   AST 14 10/21/2013 0503   ALT 17 10/21/2013 0503   ALKPHOS 63 10/21/2013 0503   BILITOT 0.2* 10/21/2013 0503   GFRNONAA >90 05/08/2014 1623   GFRAA >90 05/08/2014 1623       Component Value Date/Time   WBC 7.9 05/08/2014 1623   WBC 6.3 01/16/2014 0832   WBC 10.3 10/31/2013 0152   HGB 13.5 05/08/2014 1623   HGB 14.6 01/16/2014 0832   HGB 16.8 10/31/2013 0152   HCT 39.8 05/08/2014 1623   HCT 42.8 01/16/2014 0832   HCT 48.1 10/31/2013 0152   MCV 79.6 05/08/2014 1623   MCV 83.1 01/16/2014 0832   MCV 85.9 10/31/2013 0152    Lipid Panel     Component Value Date/Time   CHOL 131 06/23/2011 0555   TRIG 83 06/23/2011 0555   HDL 29* 06/23/2011 0555   CHOLHDL 4.5 06/23/2011 0555   VLDL 17 06/23/2011 0555   LDLCALC 85 06/23/2011 0555   LDLCALC 101 05/17/2010 0000    ABG    Component Value Date/Time   PHART 7.398 03/12/2011 1220   PCO2ART 36.8 03/12/2011 1220   PO2ART 64.9* 03/12/2011 1220   HCO3 22.2 03/12/2011 1220   TCO2 25 06/22/2011 0414   ACIDBASEDEF 1.9 03/12/2011 1220   O2SAT 92.4 03/12/2011 1220     Lab Results  Component Value Date   TSH 1.215 07/10/2013   BNP (last 3 results)  Recent Labs  07/10/13 0159 10/31/13 0152  PROBNP 79.5 359.3*   Cardiac Panel (last 3  results) No results for input(s): CKTOTAL, CKMB, TROPONINI, RELINDX in the last 72 hours.  Iron/TIBC/Ferritin/ %Sat No results found for: IRON, TIBC, FERRITIN, IRONPCTSAT   EKG Orders placed or performed during the hospital encounter of 01/16/14  . ED EKG  . ED EKG  . EKG 12-Lead  . EKG 12-Lead  . EKG     Prior Assessment and Plan Problem List as of 06/26/2014  Cardiovascular and Mediastinum   ASCVD (arteriosclerotic cardiovascular disease)   Last Assessment & Plan   07/20/2013 Office Visit Written 07/20/2013  2:26 PM by Lendon Colonel, NP    He is without cardiac complaint today. Will continue him on risk modifcation with smoking cessation counseling. He is to work harder at quitting.    Atrial fibrillation with rapid ventricular response   Last Assessment & Plan   07/20/2013 Office Visit Written 07/20/2013  2:27 PM by Lendon Colonel, NP    Paroxysmal. Related to acute illness with pneumonia. Will not start on anticoagulation unless he has recurrence outside of illness. Continue Cardizem and ASA.       Respiratory   ASTHMA   COPD (chronic obstructive pulmonary disease)   Decompensated COPD with exacerbation (chronic obstructive pulmonary disease)   Acute respiratory failure with hypoxia   CAP (community acquired pneumonia)   Last Assessment & Plan   07/20/2013 Office Visit Written 07/20/2013  2:28 PM by Lendon Colonel, NP    Continues recovery but better each day.     Hemoptysis     Digestive   GERD with stricture   Nausea vomiting and diarrhea     Endocrine   Glucose intolerance (pre-diabetes)   Diabetes     Musculoskeletal and Integument   Spondylosis, cervical   Rotator cuff syndrome of left shoulder     Other   Hyperlipidemia   Last Assessment & Plan   05/24/2013 Office Visit Written 05/24/2013  2:05 PM by Lendon Colonel, NP    He refuses labs at this time due to financial constraints. He is to find PCP as soon as possible.,    TOBACCO ABUSE    Last Assessment & Plan   05/24/2013 Office Visit Written 05/24/2013  2:04 PM by Lendon Colonel, NP    Unfortunately continues to smoke. I have strongly advised him to quit as this is significant risk factor for worsening CAD. 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 More recommendations once his biopsy has returned.    SOB (shortness of breath)   Dehydration   Cough   Abdominal pain   Pulmonary cavitary lesion   Shoulder pain   Shoulder pain, left   Neck pain on left side   Protein-calorie malnutrition, severe   Pain in joint, lower leg   Muscle spasm of both lower legs       Imaging: No results found.

## 2014-06-26 NOTE — Assessment & Plan Note (Signed)
He continues on inhalers. May need to consider changing to Xopenex if HR is difficult to control.

## 2014-06-26 NOTE — Assessment & Plan Note (Signed)
No recurrent chest pain. NO evidence of ischemia on EKG today. Continue current cardiac regimen.

## 2014-06-26 NOTE — Progress Notes (Signed)
HPI:  [name] is a 53 year old patient to be established with either Dr. Bronson [name] or Dr.Branch, that we follow for ongoing assessment and management of atrial fibrillation, with history of COPD, and ongoing tobacco abuse. He also has been diagnosed with a cavitary lung mass and is followed by Dr. Roxan [name]. He was last seen in the office in January 2015 at which time he was without cardiac complaints. He was continued on current medication regimen, with smoking cessation. Discussion. He was not restarted on coagulation unless he had recurrence of atrial fibrillation. He had one episode, which was related to acute illness with pneumonia.  Unfortunately, the patient was seen at [facility] on 05/08/2014 with complaints of shortness of breath. He is home oxygen dependent at that time.there no changes in his medication regimen, he was not found to have pneumonia. He was treated with continuous nebulizer therapy, and prednisone. Was given a dose of Levaquin.  The patient was again admitted to [facility] in December of 2015, with 2 days of chest pain, substernal pressure upon awakening each morning. He was found to be hypertensive. He was treated with nitroglycerin, with normalization of blood pressure. He was felt that his discomfort was related to hypertensive urgency. He was restarted on a hypertensive medicine amlodipine 5 mg daily, when necessary sublingual nitroglycerin, aspirin 81 mg daily and started on Zantac 150 mg twice a day.   Since being seen last, he has been hospitalized in [facility], at [facility], 2015, to June 18 2014, for atrial fib. He had driven 12 hours to pick up a camper. He was up for 36 hours straight. Finally went to sleep and awoke 3 hours later with HR racing, and chest pressure. Went to ER where was admitted and placed on diltiazem gtt. He states he converted back to NSR. He was not placed on anticoagulation and is here for follow up concerning  medication refills and need to start anticoagulation.    Allergies  Allergen Reactions  . Influenza Vaccine Live Swelling    Current Outpatient Prescriptions  Medication Sig Dispense Refill  . albuterol (PROVENTIL) (2.5 MG/3ML) 0.083% nebulizer solution Take 2.5 mg by nebulization every 6 (six) hours as needed for wheezing or shortness of breath.    [name] albuterol (VENTOLIN HFA) 108 (90 BASE) MCG/ACT inhaler Inhale 2 puffs into the lungs every 4 (four) hours as needed. Shortness of breath    . aspirin EC 81 MG tablet Take 81 mg by mouth daily.    . budesonide-formoterol (SYMBICORT) 80-4.5 MCG/ACT inhaler Inhale 2 puffs into the lungs 2 (two) times daily. 1 Inhaler 12  . diltiazem (CARDIZEM CD) 180 MG 24 hr capsule Take 1 capsule (180 mg total) by mouth daily. 90 capsule 3  . fluticasone (FLONASE) 50 MCG/ACT nasal spray Place 2 sprays into both nostrils daily.    . Fluticasone-Salmeterol (ADVAIR DISKUS) 250-50 MCG/DOSE AEPB Inhale 1 puff into the lungs every 12 (twelve) hours.     . nitroGLYCERIN (NITROSTAT) 0.4 MG SL tablet Place 1 tablet (0.4 mg total) under the tongue every 5 (five) minutes as needed for chest pain. 25 tablet 4  . tiotropium (SPIRIVA) 18 MCG inhalation capsule Place 18 mcg into inhaler and inhale daily.    [name] edoxaban (SAVAYSA) 60 MG TABS tablet Take 60 mg by mouth daily. 30 tablet 3   No current facility-administered medications for this visit.    Past Medical History  Diagnosis Date  . COPD (chronic obstructive pulmonary  disease)   . Hypercholesteremia   . History of pneumonia   . Polycythemia   . Coronary atherosclerosis of native coronary artery     Mild nonobstructive disease at catheterization 2007  . 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 Borderline diabetes   . Seizures     Last seizure 2 yrs ago  . GERD (gastroesophageal reflux disease)   . Type 2 diabetes mellitus   . DDD (degenerative disc disease)     Cervical and  thoracic  . Chronic back pain   . Bronchitis   . Chronic left shoulder pain   . Chronic neck pain   . Atrial fibrillation     Not anticoagulated  . Smoker     Past Surgical History  Procedure Laterality Date  . Colonoscopy w/ endoscopic [place]    . Vasectomy    . Throat biopsy    . Lung biopsy    . Vasectomy  1987    [id number] review of systems performed and found to be negative unless outlined above  PHYSICAL EXAM BP 118/72 mmHg  Pulse 101  Ht 5\' 8"  (1.727 m)  Wt 134 lb (60.782 kg)  BMI 20.38 kg/m2 General: Well developed, well nourished, in no acute distress Head: Eyes PERRLA, No xanthomas.   Normal cephalic and atramatic  Lungs: Clear bilaterally to auscultation some crackles in the left base.  Heart: HRRR S1 S2, without MRG.  Pulses are 2+ & equal.            No carotid bruit. No JVD.  No abdominal bruits. No femoral bruits. Abdomen: Bowel sounds are positive, abdomen soft and non-tender without masses or                  Hernia's noted. Msk:  Back normal, normal gait. Normal strength and tone for age. Extremities: No clubbing, cyanosis or edema.  DP +1 Neuro: Alert and oriented X 3. Psych:  Good affect, responds appropriately   EKG:NSR rate of 81 bpm  ASSESSMENT AND PLAN

## 2014-06-28 IMAGING — CR DG CHEST 2V
2 series · 2 of 2 positions shown · non-contrast
Comparison: Plain film 02/17/2013 and CT of 12/21/2012.

CLINICAL DATA: Short of breath. Weakness. History of COPD.

EXAM:
CHEST  2 VIEW

[view not recorded (1 of 2)]
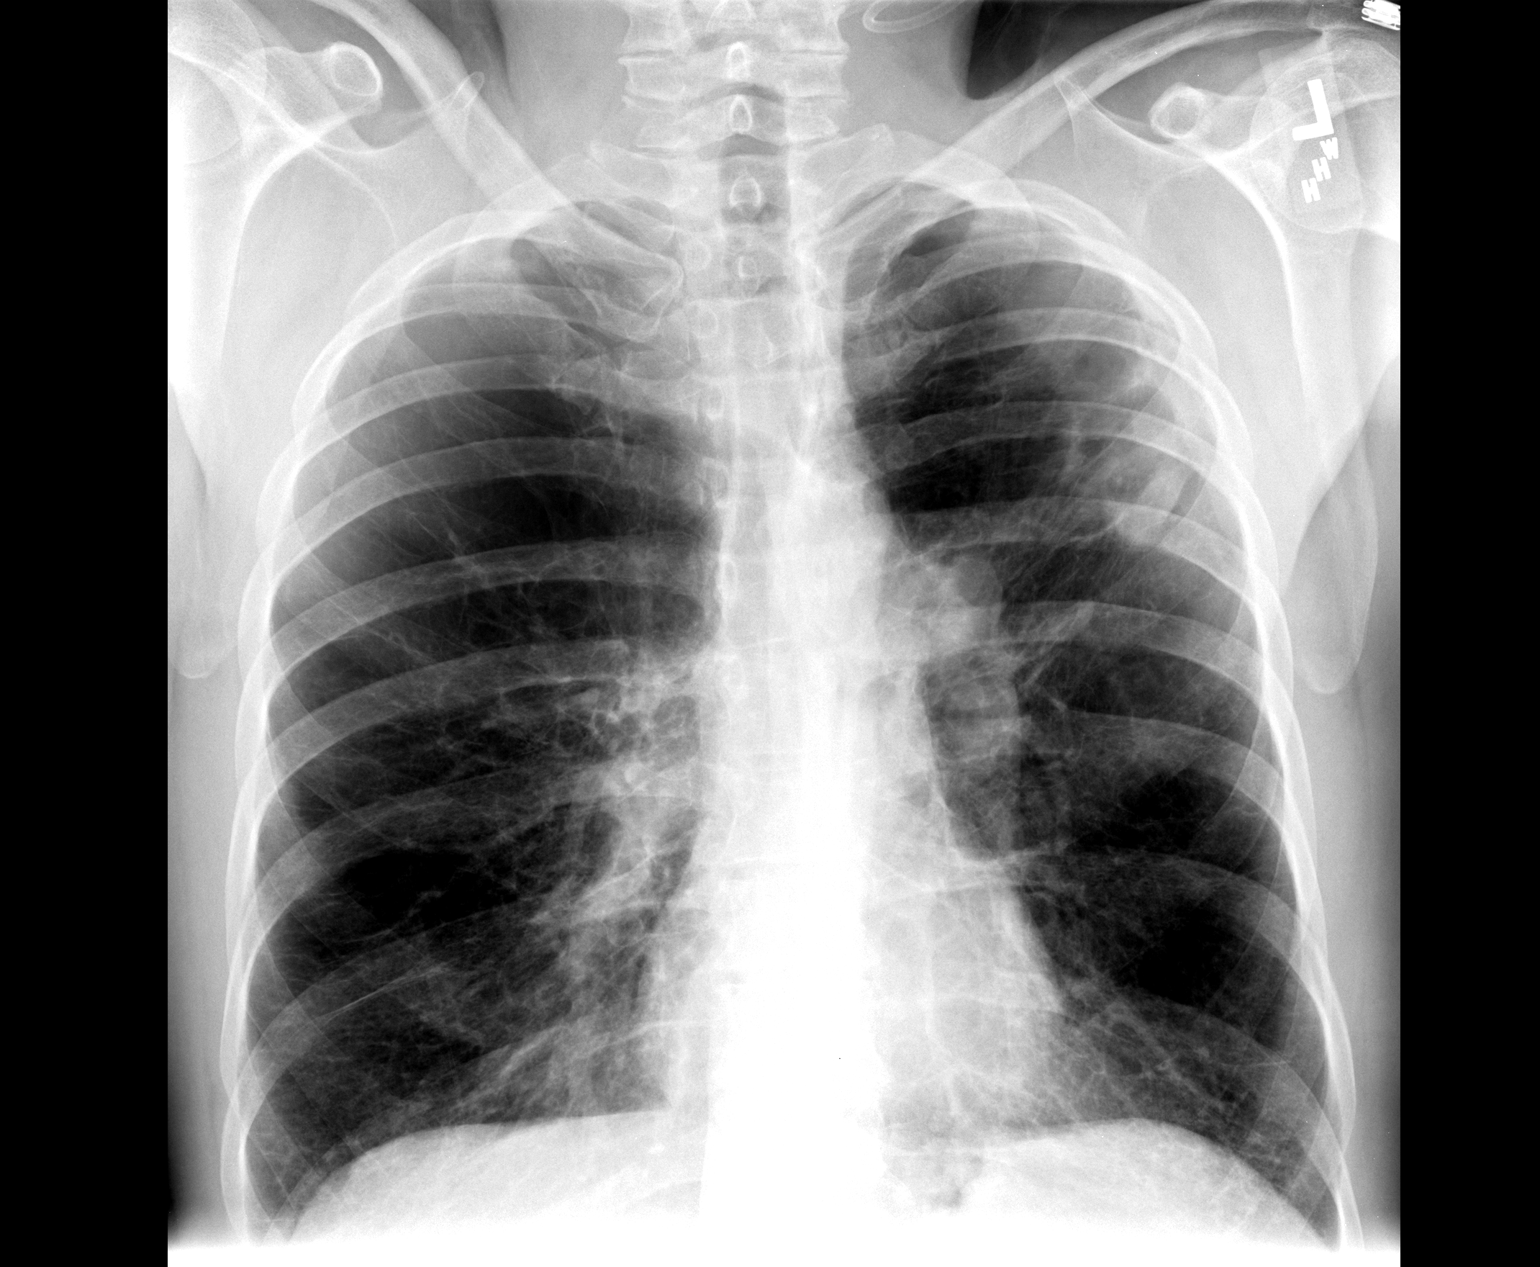

[view not recorded (2 of 2)]
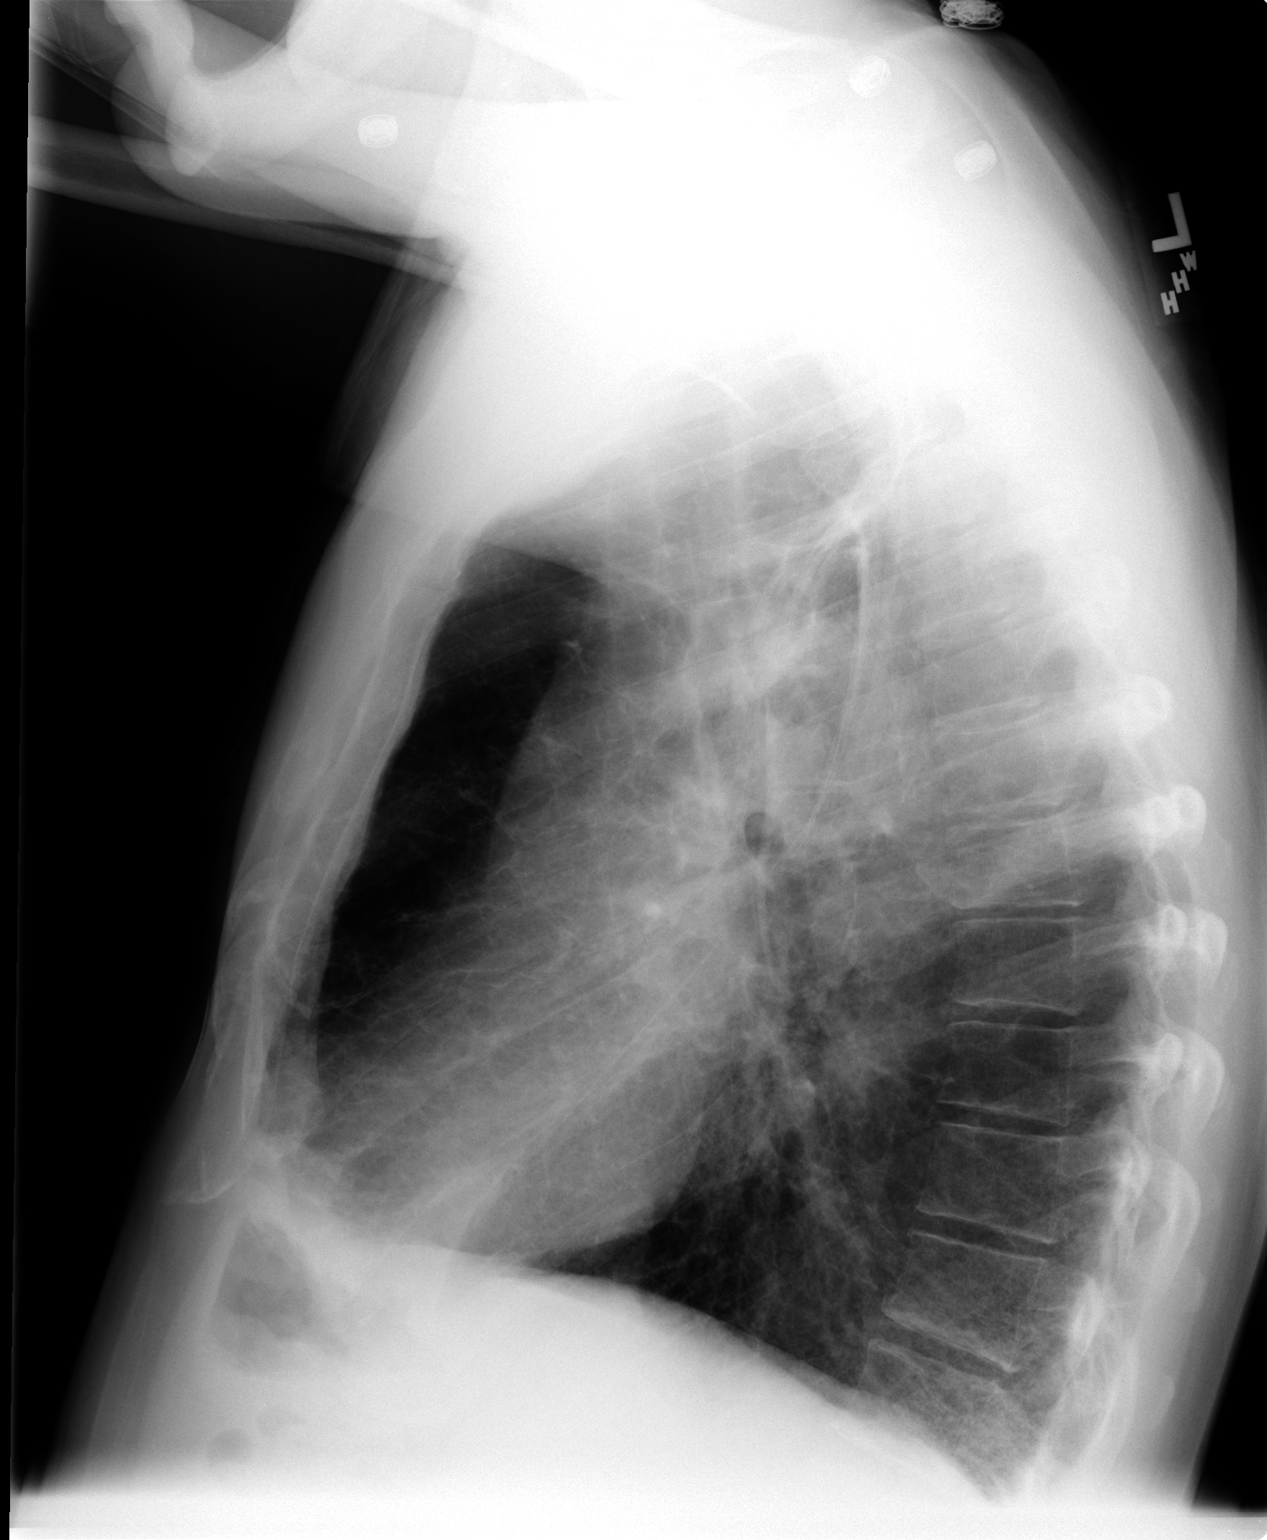

[2 of 2 positions shown; findings below may reference images not displayed]

FINDINGS: Hyperinflation secondary to COPD/emphysema.

Midline trachea. Normal heart size. Left suprahilar soft tissue
fullness is unchanged and likely related to adenopathy as detailed
on prior CT. No pleural effusion or pneumothorax. Similar appearance
of cavitary lesion involving the left upper lobe. No acute
superimposed airspace disease.
IMPRESSION: Cavitary left upper lobe lung lesion with left suprahilar
adenopathy. Grossly similar to 02/17/2013 and the CT of 12/21/2012.
Please see that CT report for further discussion.

Underlying COPD/ emphysema.

No superimposed acute process.

## 2014-07-15 ENCOUNTER — Emergency Department (HOSPITAL_COMMUNITY)
Admission: EM | Admit: 2014-07-15 | Discharge: 2014-07-15 | Disposition: A | Discharge status: Home / Self Care | Payer: Medicare Other | Attending: Emergency Medicine | Admitting: Emergency Medicine

## 2014-07-15 ENCOUNTER — Encounter (HOSPITAL_COMMUNITY): Payer: Self-pay | Admitting: Emergency Medicine

## 2014-07-15 ENCOUNTER — Emergency Department (HOSPITAL_COMMUNITY): Payer: Medicare Other

## 2014-07-15 DIAGNOSIS — E119 Type 2 diabetes mellitus without complications: Secondary | ICD-10-CM | POA: Insufficient documentation

## 2014-07-15 DIAGNOSIS — Z8739 Personal history of other diseases of the musculoskeletal system and connective tissue: Secondary | ICD-10-CM | POA: Insufficient documentation

## 2014-07-15 DIAGNOSIS — Z720 Tobacco use: Secondary | ICD-10-CM | POA: Diagnosis not present

## 2014-07-15 DIAGNOSIS — Z8639 Personal history of other endocrine, nutritional and metabolic disease: Secondary | ICD-10-CM | POA: Insufficient documentation

## 2014-07-15 DIAGNOSIS — J189 Pneumonia, unspecified organism: Secondary | ICD-10-CM

## 2014-07-15 DIAGNOSIS — I2510 Atherosclerotic heart disease of native coronary artery without angina pectoris: Secondary | ICD-10-CM | POA: Diagnosis not present

## 2014-07-15 DIAGNOSIS — J159 Unspecified bacterial pneumonia: Secondary | ICD-10-CM | POA: Diagnosis not present

## 2014-07-15 DIAGNOSIS — Z862 Personal history of diseases of the blood and blood-forming organs and certain disorders involving the immune mechanism: Secondary | ICD-10-CM | POA: Diagnosis not present

## 2014-07-15 DIAGNOSIS — G8929 Other chronic pain: Secondary | ICD-10-CM | POA: Diagnosis not present

## 2014-07-15 DIAGNOSIS — Z79899 Other long term (current) drug therapy: Secondary | ICD-10-CM | POA: Diagnosis not present

## 2014-07-15 DIAGNOSIS — R0602 Shortness of breath: Secondary | ICD-10-CM | POA: Diagnosis present

## 2014-07-15 DIAGNOSIS — Z8719 Personal history of other diseases of the digestive system: Secondary | ICD-10-CM | POA: Diagnosis not present

## 2014-07-15 DIAGNOSIS — R042 Hemoptysis: Secondary | ICD-10-CM | POA: Diagnosis not present

## 2014-07-15 DIAGNOSIS — I4891 Unspecified atrial fibrillation: Secondary | ICD-10-CM | POA: Insufficient documentation

## 2014-07-15 DIAGNOSIS — J440 Chronic obstructive pulmonary disease with acute lower respiratory infection: Secondary | ICD-10-CM | POA: Diagnosis not present

## 2014-07-15 DIAGNOSIS — R0600 Dyspnea, unspecified: Secondary | ICD-10-CM

## 2014-07-15 DIAGNOSIS — Z7982 Long term (current) use of aspirin: Secondary | ICD-10-CM | POA: Diagnosis not present

## 2014-07-15 LAB — BASIC METABOLIC PANEL
ANION GAP: 7 (ref 5–15)
BUN: 15 mg/dL (ref 6–23)
CO2: 25 mmol/L (ref 19–32)
CREATININE: 0.91 mg/dL (ref 0.50–1.35)
Calcium: 8.3 mg/dL — ABNORMAL LOW (ref 8.4–10.5)
Chloride: 103 mEq/L (ref 96–112)
GFR calc Af Amer: >90 mL/min (ref >90)
Glucose, Bld: 170 mg/dL — ABNORMAL HIGH (ref 70–99)
Potassium: 3.4 mmol/L — ABNORMAL LOW (ref 3.5–5.1)
SODIUM: 135 mmol/L (ref 135–145)

## 2014-07-15 LAB — CBC WITH DIFFERENTIAL/PLATELET
BASOS ABS: 0 10*3/uL (ref 0.0–0.1)
BASOS PCT: 0 % (ref 0–1)
Eosinophils Absolute: 0.1 10*3/uL (ref 0.0–0.7)
Eosinophils Relative: 1 % (ref 0–5)
HCT: 42.3 % (ref 39.0–52.0)
Hemoglobin: 13.7 g/dL (ref 13.0–17.0)
LYMPHS PCT: 25 % (ref 12–46)
Lymphs Abs: 2.1 10*3/uL (ref 0.7–4.0)
MCH: 27.1 pg (ref 26.0–34.0)
MCHC: 32.4 g/dL (ref 30.0–36.0)
MCV: 83.6 fL (ref 78.0–100.0)
MONO ABS: 0.6 10*3/uL (ref 0.1–1.0)
Monocytes Relative: 7 % (ref 3–12)
NEUTROS ABS: 5.6 10*3/uL (ref 1.7–7.7)
NEUTROS PCT: 67 % (ref 43–77)
PLATELETS: 224 10*3/uL (ref 150–400)
RBC: 5.06 MIL/uL (ref 4.22–5.81)
RDW: 15.4 % (ref 11.5–15.5)
WBC: 8.3 10*3/uL (ref 4.0–10.5)

## 2014-07-15 LAB — PROTIME-INR
INR: 1.24 (ref 0.00–1.49)
PROTHROMBIN TIME: 15.7 s — AB (ref 11.6–15.2)

## 2014-07-15 LAB — I-STAT TROPONIN, ED: TROPONIN I, POC: 0 ng/mL (ref 0.00–0.08)

## 2014-07-15 MED ORDER — LEVOFLOXACIN 750 MG PO TABS: ORAL_TABLET | ORAL | Status: DC

## 2014-07-15 MED ORDER — PREDNISONE 50 MG PO TABS: ORAL_TABLET | ORAL | Status: DC

## 2014-07-15 MED ORDER — OXYCODONE-ACETAMINOPHEN 5-325 MG PO TABS: 1.0000 | ORAL_TABLET | ORAL | Status: DC | PRN

## 2014-07-15 MED ORDER — OXYCODONE-ACETAMINOPHEN 5-325 MG PO TABS
2.0000 | ORAL_TABLET | Freq: Once | ORAL | Status: AC
  Administered 2014-07-15: 2 via ORAL
  Filled 2014-07-15: qty 2

## 2014-07-15 MED ORDER — LEVOFLOXACIN IN D5W 750 MG/150ML IV SOLN
750.0000 mg | Freq: Once | INTRAVENOUS | Status: AC
  Administered 2014-07-15: 750 mg via INTRAVENOUS

## 2014-07-15 MED ORDER — IPRATROPIUM-ALBUTEROL 0.5-2.5 (3) MG/3ML IN SOLN
3.0000 mL | Freq: Once | RESPIRATORY_TRACT | Status: AC
  Administered 2014-07-15: 3 mL via RESPIRATORY_TRACT
  Filled 2014-07-15: qty 3

## 2014-07-15 MED ORDER — PREDNISONE 50 MG PO TABS
60.0000 mg | ORAL_TABLET | Freq: Once | ORAL | Status: AC
  Administered 2014-07-15: 60 mg via ORAL
  Filled 2014-07-15 (×2): qty 1

## 2014-07-15 MED ORDER — MORPHINE SULFATE 4 MG/ML IJ SOLN
INTRAMUSCULAR | Status: AC
  Filled 2014-07-15: qty 1

## 2014-07-15 MED ORDER — ALBUTEROL (5 MG/ML) CONTINUOUS INHALATION SOLN
10.0000 mg/h | INHALATION_SOLUTION | Freq: Once | RESPIRATORY_TRACT | Status: AC
  Administered 2014-07-15: 10 mg/h via RESPIRATORY_TRACT
  Filled 2014-07-15: qty 20

## 2014-07-15 MED ORDER — MORPHINE SULFATE 4 MG/ML IJ SOLN
4.0000 mg | Freq: Once | INTRAMUSCULAR | Status: AC
  Administered 2014-07-15: 4 mg via INTRAVENOUS

## 2014-07-15 NOTE — ED Provider Notes (Signed)
CSN: [id number]     Arrival date & time 07/15/14  0036 History  This chart was scribed for Sharyon Cable, MD by Chester Holstein, ED Scribe. This patient was seen in room APA08/APA08 and the patient's care was started at 12:54 AM.    Chief Complaint  Patient presents with  . Shortness of Breath      Patient is a 54 y.o. male presenting with chest pain. The history is provided by the patient. No language interpreter was used.  Chest Pain Pain location:  Substernal area and epigastric Pain quality: radiating   Pain radiates to:  L shoulder Pain radiates to the back: no   Pain severity:  Moderate Timing:  Intermittent Progression:  Unchanged Context: at rest   Relieved by:  Nothing Exacerbated by: coughing. Ineffective treatments:  Oxygen (albuterol) Associated symptoms: abdominal pain, cough (with blood), diaphoresis and shortness of breath   Associated symptoms: no fever and not vomiting   Abdominal pain:    Location:  Generalized Cough:    Cough characteristics:  Harsh   Sputum characteristics:  Nondescript   Severity:  Severe   Onset quality:  Sudden   Duration:  4 hours   Timing:  Constant   Progression:  Worsening   Chronicity:  New Risk factors: smoking     Past Medical History  Diagnosis Date  . COPD (chronic obstructive pulmonary disease)   . Hypercholesteremia   . History of pneumonia   . Polycythemia   . Coronary atherosclerosis of native coronary artery     Mild nonobstructive disease at catheterization 2007  . 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 Borderline diabetes   . Seizures     Last seizure 2 yrs ago  . GERD (gastroesophageal reflux disease)   . Type 2 diabetes mellitus   . DDD (degenerative disc disease)     Cervical and thoracic  . Chronic back pain   . Bronchitis   . Chronic left shoulder pain   . Chronic neck pain   . Atrial fibrillation     Not anticoagulated  . Smoker    Past Surgical History   Procedure Laterality Date  . Colonoscopy w/ endoscopic [place]    . Vasectomy    . Throat biopsy    . Lung biopsy    . Vasectomy  1987   Family History  Problem Relation Age of Onset  . Hypertension Mother   . Diabetes Mother   . Heart attack Mother   . Heart failure Mother   . Hypertension Sister   . Diabetes Sister   . Heart failure Sister    History  Substance Use Topics  . Smoking status: Current Every Day Smoker -- 0.50 packs/day for 45 years    Types: Cigarettes  . Smokeless tobacco: Never Used     Comment: started smoking half pack 2 days ago per stopped in the hospital as of 07-20-2013  . Alcohol Use: No    Review of Systems  Constitutional: Positive for diaphoresis. Negative for fever.  HENT: Negative for nosebleeds.   Respiratory: Positive for cough (with blood) and shortness of breath.   Cardiovascular: Positive for chest pain and leg swelling.  Gastrointestinal: Positive for abdominal pain and diarrhea. Negative for vomiting and blood in stool.  All other systems reviewed and are negative.     Allergies  Influenza vaccine live  Home Medications   Prior to Admission medications   Medication Sig Start  Date End Date Taking? Authorizing Provider  albuterol (PROVENTIL) (2.5 MG/3ML) 0.083% nebulizer solution Take 2.5 mg by nebulization every 6 (six) hours as needed for wheezing or shortness of breath.    Historical Provider, MD  albuterol (VENTOLIN HFA) 108 (90 BASE) MCG/ACT inhaler Inhale 2 puffs into the lungs every 4 (four) hours as needed. Shortness of breath    Historical Provider, MD  aspirin EC 81 MG tablet Take 81 mg by mouth daily.    Historical Provider, MD  budesonide-formoterol (SYMBICORT) 80-4.5 MCG/ACT inhaler Inhale 2 puffs into the lungs 2 (two) times daily. 10/21/13   Charlynne Cousins, MD  diltiazem (CARDIZEM CD) 180 MG 24 hr capsule Take 1 capsule (180 mg total) by mouth daily. 06/26/14   Lendon Colonel, NP  edoxaban (SAVAYSA) 60 MG TABS  tablet Take 60 mg by mouth daily. 06/26/14   Lendon Colonel, NP  fluticasone (FLONASE) 50 MCG/ACT nasal spray Place 2 sprays into both nostrils daily.    Historical Provider, MD  Fluticasone-Salmeterol (ADVAIR DISKUS) 250-50 MCG/DOSE AEPB Inhale 1 puff into the lungs every 12 (twelve) hours.     Historical Provider, MD  nitroGLYCERIN (NITROSTAT) 0.4 MG SL tablet Place 1 tablet (0.4 mg total) under the tongue every 5 (five) minutes as needed for chest pain. 04/22/13   Lendon Colonel, NP  tiotropium (SPIRIVA) 18 MCG inhalation capsule Place 18 mcg into inhaler and inhale daily.    Historical Provider, MD   BP 105/63 mmHg  Pulse 83  Temp(Src) 98.2 F (36.8 C) (Oral)  Resp 20  SpO2 96% Physical Exam  Nursing note and vitals reviewed.  CONSTITUTIONAL: Well developed/well nourished HEAD: Normocephalic/atraumatic EYES: EOMI/PERRL ENMT: Mucous membranes moist, no blood noted to nose/oropharynx NECK: supple no meningeal signs SPINE/BACK:entire spine nontender CV: S1/S2 noted, no murmurs/rubs/gallops noted LUNGS: decreased breath sounds bilaterally, diffuse chest wall tenderness, no bruising or crepitus noted ABDOMEN: soft, nontender, no rebound or guarding, bowel sounds noted throughout abdomen GU:no cva tenderness NEURO: Pt is awake/alert/appropriate, moves all extremitiesx4.  No facial droop.   EXTREMITIES: pulses normal/equal, full ROM, no LE edema noted SKIN: warm, color normal PSYCH: no abnormalities of mood noted, alert and oriented to situation  ED Course  Procedures   COORDINATION OF CARE: 12:58 AM Discussed treatment plan with patient at beside, the patient agrees with the plan and has no further questions at this time.   Patient reports worsening cough in the past day that has triggered left chest and left shoulder pain.  He reports only has pain with cough.  He reports using home nebulizers and home oxygen without relief On repeat exam he has diffuse wheezing and left  sided chest wall tenderness. While in room he had an episode of coughing and produced small amt of sputum mixed with blood. He reports several episodes in the past several hours but only producing small amt of blood He reports last time he had this type of hemoptysis he had pneumonia and was not on ATC He just started [facility] for atrial fibrillation and he knows this can be side effect but has tolerated this drug without bleeding for several weeks He does evidence of probable pneumonia on CXR  IV levaquin ordered (pt without admission >48 hours recently) Another nebulizer treatment ordered 4:35 AM Pt reports he feels improved after treatments He ambulated but reports he feels at baseline.  He had reported cough which triggered CP during walking, but feels much improved He would like to go home.  I suspect the hemoptysis he is having is likely related to early pneumonia/COPD exacerbation rather than true bleeding source due to his Anticoagulant.  He denies any other bleeding source (no epistaxis, denies hematemesis) He looks very well, no distress noted.  I doubt massive hemoptysis I doubt acute PE at this time Advised to call his cardiologist next week We discussed strict return precautions He is well appearing/nontoxic, no distress noted He was advised to use his home oxygen and use his nebulizers more frequently He would like to add on prednisone for his exacerbation BP 117/71 mmHg  Pulse 77  Temp(Src) 98.2 F (36.8 C) (Oral)  Resp 16  SpO2 95%  Labs Review Labs Reviewed  BASIC METABOLIC PANEL - Abnormal; Notable for the following:    Potassium 3.4 (*)    Glucose, Bld 170 (*)    Calcium 8.3 (*)    All other components within normal limits  PROTIME-INR - Abnormal; Notable for the following:    Prothrombin Time 15.7 (*)    All other components within normal limits  CBC WITH DIFFERENTIAL  Randolm Idol, ED    Imaging Review Dg Chest 2 View  07/15/2014   CLINICAL DATA:   Acute onset of generalized chest pain and bloody sputum. Initial encounter.  EXAM: CHEST  2 VIEW  COMPARISON:  Chest radiograph performed 05/08/2014  FINDINGS: The lungs are well-aerated. Mildly worsened bibasilar interstitial opacification could reflect chronic worsening interstitial lung disease, though mild acute pneumonia could have a similar appearance. Chronic peribronchial thickening is noted. The cavitary lesion in the left upper lobe is slightly less well characterized. Underlying emphysematous change is again seen. There is no evidence of pleural effusion or pneumothorax.  The heart is normal in size; the mediastinal contour is within normal limits. No acute osseous abnormalities are seen.  IMPRESSION: 1. Mildly worsened bibasilar interstitial opacification could reflect chronic worsening interstitial lung disease, though mild acute superimposed pneumonia could have a similar appearance. 2. Known cavitary lesion at the left upper lobe is slightly less well characterized. Chronic peribronchial thickening noted. Underlying emphysematous change again seen.   Electronically Signed   By: Garald Balding M.D.   On: 07/15/2014 01:48     EKG Interpretation   Date/Time:  Saturday July 15 2014 00:55:13 EST Ventricular Rate:  81 PR Interval:  124 QRS Duration: 92 QT Interval:  378 QTC Calculation: 439 R Axis:   71 Text Interpretation:  Sinus rhythm Nonspecific T wave abnormality No  significant change since last tracing Confirmed by Christy Gentles  MD, Modest Draeger  [phone]) on 07/15/2014 1:02:10 AM     Medications  predniSONE (DELTASONE) tablet 60 mg (not administered)  ipratropium-albuterol (DUONEB) 0.5-2.5 (3) MG/3ML nebulizer solution 3 mL (3 mLs Nebulization Given 07/15/14 0115)  levofloxacin (LEVAQUIN) IVPB 750 mg (750 mg Intravenous New Bag/Given 07/15/14 0300)  morphine 4 MG/ML injection 4 mg (4 mg Intravenous Given 07/15/14 0239)  albuterol (PROVENTIL,VENTOLIN) solution continuous neb (10 mg/hr  Nebulization Given 07/15/14 0245)  oxyCODONE-acetaminophen (PERCOCET/ROXICET) 5-325 MG per tablet 2 tablet (2 tablets Oral Given 07/15/14 0305)    MDM   Final diagnoses:  Dyspnea  CAP (community acquired pneumonia)  Chronic obstructive pulmonary disease with acute lower respiratory infection  Hemoptysis   Nursing notes including past medical history and social history reviewed and considered in documentation Previous records reviewed and considered xrays/imaging reviewed by myself and considered during evaluation Labs/vital reviewed myself and considered during evaluation    I personally performed the services described in this documentation,  which was scribed in my presence. The recorded information has been reviewed and is accurate.    Sharyon Cable, MD 07/15/14 [phone]

## 2014-07-15 NOTE — ED Notes (Signed)
Patient reports shortness of breath that has been worsening for the past couple of days. Reports history of emphysema. Also reports chest pain that started tonight and worsens with deep breathing. States productive cough with bloody sputum.

## 2014-07-15 NOTE — ED Notes (Signed)
Pt ambulated around nursing station x2 on room air. Pt became short of breath at very end and was able to regain normal breathing pattern after sitting on bed. Pt HR-102, respirations 26, and O2 Sat 91% on room air upon return to room. After sitting on bed for 2 minutes VS returned to baseline at HR-80, respirations 18, and O2 Sat 97% on 2L/m nasal cannula. Pt complained of chest pain with nonproductive coughing during the walk, and flank pain on right side which pt reports has been ongoing.

## 2014-07-17 ENCOUNTER — Telehealth: Payer: Self-pay | Admitting: *Deleted

## 2014-07-17 NOTE — Telephone Encounter (Signed)
[facility] is telling patient they never received the Rx for diltiazem

## 2014-07-17 NOTE — Telephone Encounter (Signed)
Called pharmacy and confirmed diltiazem was refilled. Pt made aware.

## 2014-07-18 LAB — CBC
HEMATOCRIT: 44.4 % (ref 39.0–52.0)
HEMOGLOBIN: 14.3 g/dL (ref 13.0–17.0)
MCH: 26.3 pg (ref 26.0–34.0)
MCHC: 32.2 g/dL (ref 30.0–36.0)
MCV: 81.8 fL (ref 78.0–100.0)
MPV: 10.1 fL (ref 9.4–12.4)
Platelets: 271 10*3/uL (ref 150–400)
RBC: 5.43 MIL/uL (ref 4.22–5.81)
RDW: 16.3 % — ABNORMAL HIGH (ref 11.5–15.5)
WBC: 11.4 10*3/uL — ABNORMAL HIGH (ref 4.0–10.5)

## 2014-07-22 IMAGING — NM NM MYOCAR SINGLE W/SPECT W/WALL MOTION & EF
2 series · 12 of 12 positions shown · non-contrast
Comparison: None.

CLINICAL DATA: 52-year-old male with history of atrial
fibrillation, COPD, and CAD, now referred for the assessment of
ischemia.

EXAM:
MYOCARDIAL IMAGING WITH SPECT (REST AND PHARMACOLOGIC-STRESS)
GATED LEFT VENTRICULAR WALL MOTION STUDY
LEFT VENTRICULAR EJECTION FRACTION
TECHNIQUE: Standard myocardial SPECT imaging was performed after resting
intravenous injection of 10 mCi Wc-NNm sestamibi Subsequently,
intravenous infusion of regadenoson was performed under the
supervision of the Cardiology staff. At peak effect of the drug, 30
mCi Wc-NNm sestamibi was injected intravenously and standard
myocardial SPECT imaging was performed. Quantitative gated imaging
was also performed to evaluate left ventricular wall motion, and
estimate left ventricular ejection fraction.

[Series 1: cardiac rest stress · 6.39mm/px · 6 of 512 frames shown (1 of 2)]
[frame 43/512]
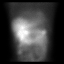
[frame 128/512]
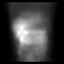
[frame 214/512]
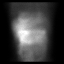
[frame 299/512]
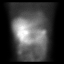
[frame 384/512]
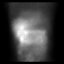
[frame 470/512]
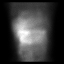

[Series 1: cardiac rest stress · 6.39mm/px · 6 of 64 frames shown (2 of 2)]
[frame 6/64]
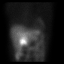
[frame 16/64]
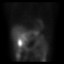
[frame 27/64]
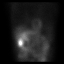
[frame 38/64]
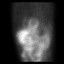
[frame 48/64]
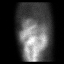
[frame 59/64]
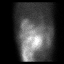

[12 of 12 positions shown; findings below may reference images not displayed]

FINDINGS: Baseline ECG shows normal sinus rhythm with decreased R-wave
progression. Regadenoson infusion was given in standard fashion.
Heart rate increased from 65 beats per min up to 137 beats per min,
and blood pressure increased from 103/80 up to 143/79. Patient
tolerated infusion without chest pain, although it did experience
shortness of breath and was treated with a rescue inhaler and
supplemental oxygen with improvement in symptoms. There were no
diagnostic ST segment abnormalities to indicate ischemia. No
significant arrhythmias were noted.

Analysis of the overall perfusion dated finds diaphragmatic
attenuation. Gut uptake also visualized near the inferior wall.

Tomographic views were obtained using the short axis, vertical long
axis, and horizontal long axis planes. There is a mild to moderate
intensity, fixed inferior and inferior septal defect. This is
actually more prominent on rest imaging than stress imaging. Most
consistent with diaphragmatic attenuation, less likely scar. No
clear evidence of ischemia.

Gated imaging reveals an EDV of 89, ESV of 45, normal TID ratio
0.97, and LVEF of 49%. No focal wall motion abnormalities noted.
IMPRESSION: Overall low risk regadenoson Cardiolite. No diagnostic ST segment
abnormalities were noted. There is evidence of diaphragmatic
attenuation, less likely inferior septal/inferior scar. No definite
ischemia. LVEF 49% with normal volumes, no definite focal wall
motion abnormalities.

## 2014-07-31 NOTE — Progress Notes (Signed)
HPI: Ryan [name] 54 year old male patient to be established with Dr. Bronson Ing or Dr.Branch device on the office last on 06/26/2014 multiple hospitalizations for atrial fibrillation with RVR.  Most recent was in [place] at [facility].  On last office visit.  He was given refills on diltiazem and started on Savaysa for which she was given samples. Unfortunately the patient was seen again in the hospital in the emergency room for complaints of shortness of breath and chest pain.  He was treated for pneumonia.  He did have some mild hemoptysis which was related to the pneumonia, not felt to be related to anticoagulant.  He was sent home on antibiotics.   He comes today with continued complaints of coughing and congestion. He is afebrile. Complaining of rib pain bilaterally from the coughing. Asking for pain medications. He unfortunately continues to smoke. Denies rapid HR or chest pain. Tolerating Savaysa but complains of the cough.  Allergies  Allergen Reactions  . Influenza Vaccine Live Swelling    Current Outpatient Prescriptions  Medication Sig Dispense Refill  . albuterol (PROVENTIL) (2.5 MG/3ML) 0.083% nebulizer solution Take 2.5 mg by nebulization every 6 (six) hours as needed for wheezing or shortness of breath.    [name] albuterol (VENTOLIN HFA) 108 (90 BASE) MCG/ACT inhaler Inhale 2 puffs into the lungs every 4 (four) hours as needed. Shortness of breath    . budesonide-formoterol (SYMBICORT) 80-4.5 MCG/ACT inhaler Inhale 2 puffs into the lungs 2 (two) times daily. 1 Inhaler 12  . edoxaban (SAVAYSA) 60 MG TABS tablet Take 60 mg by mouth daily. 30 tablet 3  . fluticasone (FLONASE) 50 MCG/ACT nasal spray Place 2 sprays into both nostrils daily.    . Fluticasone-Salmeterol (ADVAIR DISKUS) 250-50 MCG/DOSE AEPB Inhale 1 puff into the lungs every 12 (twelve) hours.     . nitroGLYCERIN (NITROSTAT) 0.4 MG SL tablet Place 1 tablet (0.4 mg total) under the tongue every 5 (five) minutes as  needed for chest pain. 25 tablet 4  . tiotropium (SPIRIVA) 18 MCG inhalation capsule Place 18 mcg into inhaler and inhale daily.     No current facility-administered medications for this visit.    Past Medical History  Diagnosis Date  . COPD (chronic obstructive pulmonary disease)   . Hypercholesteremia   . History of pneumonia   . Polycythemia   . Coronary atherosclerosis of native coronary artery     Mild nonobstructive disease at catheterization 2007  . 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 Borderline diabetes   . Seizures     Last seizure 2 yrs ago  . GERD (gastroesophageal reflux disease)   . Type 2 diabetes mellitus   . DDD (degenerative disc disease)     Cervical and thoracic  . Chronic back pain   . Bronchitis   . Chronic left shoulder pain   . Chronic neck pain   . Atrial fibrillation     Not anticoagulated  . Smoker     Past Surgical History  Procedure Laterality Date  . Colonoscopy w/ endoscopic [place]    . Vasectomy    . Throat biopsy    . Lung biopsy    . Vasectomy  1987    ROS: Complete review of systems performed and found to be negative unless outlined above  PHYSICAL EXAM BP 98/64 mmHg  Pulse 69  Ht 5\' 8"  (1.727 m)  Wt 138 lb (62.596 kg)  BMI 20.99 kg/m2  SpO2  91% General: Well developed, well nourished, in no acute distress Head: Eyes PERRLA, Positive for xanthomas.   Normal cephalic and atramatic  Lungs: Some inspiratory wheezes, with rhonchi noted in the left base. Heart: HRIR S1 S2, without MRG.  Pulses are 2+ & equal.            No carotid bruit. No JVD.  No abdominal bruits. No femoral bruits. Abdomen: Bowel sounds are positive, abdomen soft and non-tender without masses or                  Hernia's noted. Msk:  Back normal, normal gait. Normal strength and tone for age. Extremities: Mild  clubbing, cyanosis or edema.  DP +1 Neuro: Alert and oriented X 3. Psych:  Good affect, responds  appropriately  ASSESSMENT AND PLAN

## 2014-08-01 ENCOUNTER — Ambulatory Visit (INDEPENDENT_AMBULATORY_CARE_PROVIDER_SITE_OTHER): Payer: Medicare Other | Admitting: Adult Health

## 2014-08-01 ENCOUNTER — Encounter: Payer: Self-pay | Admitting: Adult Health

## 2014-08-01 VITALS — BP 98/64 | HR 69 | Ht 68.0 in | Wt 138.0 lb

## 2014-08-01 DIAGNOSIS — I2510 Atherosclerotic heart disease of native coronary artery without angina pectoris: Secondary | ICD-10-CM

## 2014-08-01 DIAGNOSIS — E876 Hypokalemia: Secondary | ICD-10-CM

## 2014-08-01 DIAGNOSIS — I4891 Unspecified atrial fibrillation: Secondary | ICD-10-CM

## 2014-08-01 MED ORDER — DILTIAZEM HCL ER COATED BEADS 120 MG PO CP24: 120.0000 mg | ORAL_CAPSULE | Freq: Every day | ORAL | Status: DC

## 2014-08-01 NOTE — Patient Instructions (Addendum)
Your physician recommends that you schedule a follow-up appointment in: 3 Months with Jory Sims, NP  Your physician has recommended you make the following change in your medication:   Decrease   Diltiazem to 120 mg Daily   Thank you for choosing [facility]!

## 2014-08-01 NOTE — Assessment & Plan Note (Signed)
Heart rate is well controlled but BP is low. I have checked it again in both arms manually. 118/68 right arm, 108/70 in the left arm.  He states he is eating and drinking but weight has been difficult to maintain. He smoked some marijuana to stimulate his appetite. I will decrease diltiazem to 120 mg daily. I will give him samples of Savaysa and fill out patient assistance form to be submitted. Will see him again in 3 months.

## 2014-08-01 NOTE — Assessment & Plan Note (Signed)
Denies cardiac chest pain. Continue current regimen with modifications as above.

## 2014-08-01 NOTE — Assessment & Plan Note (Signed)
This complicated by pnuemonia. I have advised him to stop smoking to avoid frequent respiratory infections. He follows Dr. Luan Pulling for pulmonary issues. I will differ any pain medication refils to Dr. Luan Pulling.

## 2014-08-01 NOTE — Progress Notes (Deleted)
Name: Ryan [name]    DOB: 17-Oct-1960  Age: 54 y.o.  MR#: [id number]       PCP:  [name], PA-C      Insurance: Payor: [employer] MEDICARE / Plan: [employer] MEDICARE / Product Type: *No Product type* /   CC:    Chief Complaint  Patient presents with  . Atrial Fibrillation    VS Filed Vitals:   08/01/14 1317  BP: 98/64  Pulse: 69  Height: 5\' 8"  (1.727 m)  Weight: 138 lb (62.596 kg)  SpO2: 91%    Weights Current Weight  08/01/14 138 lb (62.596 kg)  06/26/14 134 lb (60.782 kg)  05/29/14 135 lb (61.236 kg)    Blood Pressure  BP Readings from Last 3 Encounters:  08/01/14 98/64  07/15/14 117/71  06/26/14 118/72     Admit date:  (Not on file) Last encounter with RMR:  06/26/2014   Allergy Influenza vaccine live  Current Outpatient Prescriptions  Medication Sig Dispense Refill  . albuterol (PROVENTIL) (2.5 MG/3ML) 0.083% nebulizer solution Take 2.5 mg by nebulization every 6 (six) hours as needed for wheezing or shortness of breath.    [name] albuterol (VENTOLIN HFA) 108 (90 BASE) MCG/ACT inhaler Inhale 2 puffs into the lungs every 4 (four) hours as needed. Shortness of breath    . budesonide-formoterol (SYMBICORT) 80-4.5 MCG/ACT inhaler Inhale 2 puffs into the lungs 2 (two) times daily. 1 Inhaler 12  . diltiazem (CARDIZEM CD) 180 MG 24 hr capsule Take 1 capsule (180 mg total) by mouth daily. 90 capsule 3  . edoxaban (SAVAYSA) 60 MG TABS tablet Take 60 mg by mouth daily. 30 tablet 3  . fluticasone (FLONASE) 50 MCG/ACT nasal spray Place 2 sprays into both nostrils daily.    . Fluticasone-Salmeterol (ADVAIR DISKUS) 250-50 MCG/DOSE AEPB Inhale 1 puff into the lungs every 12 (twelve) hours.     . nitroGLYCERIN (NITROSTAT) 0.4 MG SL tablet Place 1 tablet (0.4 mg total) under the tongue every 5 (five) minutes as needed for chest pain. 25 tablet 4  . tiotropium (SPIRIVA) 18 MCG inhalation capsule Place 18 mcg into inhaler and inhale daily.     No current facility-administered  medications for this visit.    Discontinued Meds:    Medications Discontinued During This Encounter  Medication Reason  . oxyCODONE-acetaminophen (PERCOCET/ROXICET) 5-325 MG per tablet Discontinued by provider  . aspirin EC 81 MG tablet Discontinued by provider  . levofloxacin (LEVAQUIN) 750 MG tablet Completed Course  . predniSONE (DELTASONE) 50 MG tablet Discontinued by provider    Patient Active Problem List   Diagnosis Date Noted  . Diabetes 05/29/2014  . Pain in joint, lower leg 12/28/2013  . Muscle spasm of both lower legs 12/28/2013  . Protein-calorie malnutrition, severe 10/21/2013  . Hemoptysis 10/20/2013  . Atrial fibrillation with rapid ventricular response 07/10/2013  . CAP (community acquired pneumonia) 07/10/2013  . Shoulder pain, left 01/20/2012  . Neck pain on left side 01/20/2012  . Rotator cuff syndrome of left shoulder 01/20/2012  . Spondylosis, cervical 11/19/2011  . Shoulder pain 11/19/2011  . Acute respiratory failure with hypoxia 06/23/2011  . Pulmonary cavitary lesion 06/23/2011  . SOB (shortness of breath) 06/22/2011  . Nausea vomiting and diarrhea 06/22/2011  . Dehydration 06/22/2011  . Cough 06/22/2011  . Decompensated COPD with exacerbation (chronic obstructive pulmonary disease) 06/22/2011  . Abdominal pain 06/22/2011  . 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 Cavitary lesion of lung 05/07/2011  . GERD with stricture   .  ASCVD (arteriosclerotic cardiovascular disease)   . Glucose intolerance (pre-diabetes)   . TOBACCO ABUSE 05/17/2010  . ASTHMA 01/20/2008  . Hyperlipidemia 12/27/2007  . COPD (chronic obstructive pulmonary disease) 12/27/2007    LABS    Component Value Date/Time   NA 135 07/15/2014 0059   NA 133* 05/08/2014 1623   NA 137 01/16/2014 0832   K 3.4* 07/15/2014 0059   K 4.1 05/08/2014 1623   K 4.2 01/16/2014 0832   CL 103 07/15/2014 0059   CL 97 05/08/2014 1623   CL 97 01/16/2014 0832   CO2 25 07/15/2014  0059   CO2 25 05/08/2014 1623   CO2 27 01/16/2014 0832   GLUCOSE 170* 07/15/2014 0059   GLUCOSE 133* 05/08/2014 1623   GLUCOSE 157* 01/16/2014 0832   BUN 15 07/15/2014 0059   BUN 11 05/08/2014 1623   BUN 8 01/16/2014 0832   CREATININE 0.91 07/15/2014 0059   CREATININE 0.99 05/08/2014 1623   CREATININE 0.99 01/16/2014 0832   CALCIUM 8.3* 07/15/2014 0059   CALCIUM 8.4 05/08/2014 1623   CALCIUM 8.9 01/16/2014 0832   GFRNONAA >90 07/15/2014 0059   GFRNONAA >90 05/08/2014 1623   GFRNONAA >90 01/16/2014 0832   GFRAA >90 07/15/2014 0059   GFRAA >90 05/08/2014 1623   GFRAA >90 01/16/2014 0832   CMP     Component Value Date/Time   NA 135 07/15/2014 0059   K 3.4* 07/15/2014 0059   CL 103 07/15/2014 0059   CO2 25 07/15/2014 0059   GLUCOSE 170* 07/15/2014 0059   BUN 15 07/15/2014 0059   CREATININE 0.91 07/15/2014 0059   CALCIUM 8.3* 07/15/2014 0059   PROT 6.8 10/21/2013 0503   ALBUMIN 2.8* 10/21/2013 0503   AST 14 10/21/2013 0503   ALT 17 10/21/2013 0503   ALKPHOS 63 10/21/2013 0503   BILITOT 0.2* 10/21/2013 0503   GFRNONAA >90 07/15/2014 0059   GFRAA >90 07/15/2014 0059       Component Value Date/Time   WBC 11.4* 07/17/2014 1439   WBC 8.3 07/15/2014 0059   WBC 7.9 05/08/2014 1623   HGB 14.3 07/17/2014 1439   HGB 13.7 07/15/2014 0059   HGB 13.5 05/08/2014 1623   HCT 44.4 07/17/2014 1439   HCT 42.3 07/15/2014 0059   HCT 39.8 05/08/2014 1623   MCV 81.8 07/17/2014 1439   MCV 83.6 07/15/2014 0059   MCV 79.6 05/08/2014 1623    Lipid Panel     Component Value Date/Time   CHOL 131 06/23/2011 0555   TRIG 83 06/23/2011 0555   TRIG 79 05/17/2010 0000   HDL 29* 06/23/2011 0555   CHOLHDL 4.5 06/23/2011 0555   VLDL 17 06/23/2011 0555   LDLCALC 85 06/23/2011 0555   LDLCALC 101 05/17/2010 0000    ABG    Component Value Date/Time   PHART 7.398 03/12/2011 1220   PCO2ART 36.8 03/12/2011 1220   PO2ART 64.9* 03/12/2011 1220   HCO3 22.2 03/12/2011 1220   TCO2 25  06/22/2011 0414   ACIDBASEDEF 1.9 03/12/2011 1220   O2SAT 92.4 03/12/2011 1220     Lab Results  Component Value Date   TSH 1.215 07/10/2013   BNP (last 3 results)  Recent Labs  10/31/13 0152  PROBNP 359.3*   Cardiac Panel (last 3 results) No results for input(s): CKTOTAL, CKMB, TROPONINI, RELINDX in the last 72 hours.  Iron/TIBC/Ferritin/ %Sat No results found for: IRON, TIBC, FERRITIN, IRONPCTSAT   EKG Orders placed or performed during the hospital encounter of 07/15/14  . EKG  12-Lead  . EKG 12-Lead  . EKG     Prior Assessment and Plan Problem List as of 08/01/2014      Cardiovascular and Mediastinum   ASCVD (arteriosclerotic cardiovascular disease)   Last Assessment & Plan 06/26/2014 Office Visit Written 06/26/2014  3:50 PM by Lendon Colonel, NP    No recurrent chest pain. NO evidence of ischemia on EKG today. Continue current cardiac regimen.       Atrial fibrillation with rapid ventricular response   Last Assessment & Plan 06/26/2014 Office Visit Written 06/26/2014  3:47 PM by Lendon Colonel, NP    This is his 4 th documented recurrence of atrial fib. I will begin him on Savaysa 60 mg daily. Last GFR >90. Creatinine 0.99. I will give him refills on diltiazem 180 mg daily. He has been given samples of the Savaysa.  I will check BMET and CBC in a couple of weeks. See him in one month. He is to call if he has issues with bleeding. He verbalizes understanding.  In the interim, I will get records from [facility] to review concerning treatment course while hospitalized along with echo results.         Respiratory   ASTHMA   COPD (chronic obstructive pulmonary disease)   Last Assessment & Plan 06/26/2014 Office Visit Written 06/26/2014  3:50 PM by Lendon Colonel, NP    He continues on inhalers. May need to consider changing to Xopenex if HR is difficult to control.       Decompensated COPD with exacerbation (chronic obstructive pulmonary  disease)   Acute respiratory failure with hypoxia   CAP (community acquired pneumonia)   Last Assessment & Plan 07/20/2013 Office Visit Written 07/20/2013  2:28 PM by Lendon Colonel, NP    Continues recovery but better each day.       Hemoptysis     Digestive   GERD with stricture   Nausea vomiting and diarrhea     Endocrine   Glucose intolerance (pre-diabetes)   Diabetes     Musculoskeletal and Integument   Spondylosis, cervical   Rotator cuff syndrome of left shoulder     Other   Hyperlipidemia   Last Assessment & Plan 05/24/2013 Office Visit Written 05/24/2013  2:05 PM by Lendon Colonel, NP    He refuses labs at this time due to financial constraints. He is to find PCP as soon as possible.,      TOBACCO ABUSE   Last Assessment & Plan 05/24/2013 Office Visit Written 05/24/2013  2:04 PM by Lendon Colonel, NP    Unfortunately continues to smoke. I have strongly advised him to quit as this is significant risk factor for worsening CAD. 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 More recommendations once his biopsy has returned.      SOB (shortness of breath)   Dehydration   Cough   Abdominal pain   Pulmonary cavitary lesion   Shoulder pain   Shoulder pain, left   Neck pain on left side   Protein-calorie malnutrition, severe   Pain in joint, lower leg   Muscle spasm of both lower legs       Imaging: Dg Chest 2 View  07/15/2014   CLINICAL  DATA:  Acute onset of generalized chest pain and bloody sputum. Initial encounter.  EXAM: CHEST  2 VIEW  COMPARISON:  Chest radiograph performed 05/08/2014  FINDINGS: The lungs are well-aerated. Mildly worsened bibasilar interstitial opacification could reflect chronic worsening interstitial lung disease, though  mild acute pneumonia could have a similar appearance. Chronic peribronchial thickening is noted. The cavitary lesion in the left upper lobe is slightly less well characterized. Underlying emphysematous change is again seen. There is no evidence of pleural effusion or pneumothorax.  The heart is normal in size; the mediastinal contour is within normal limits. No acute osseous abnormalities are seen.  IMPRESSION: 1. Mildly worsened bibasilar interstitial opacification could reflect chronic worsening interstitial lung disease, though mild acute superimposed pneumonia could have a similar appearance. 2. Known cavitary lesion at the left upper lobe is slightly less well characterized. Chronic peribronchial thickening noted. Underlying emphysematous change again seen.   Electronically Signed   By: Garald Balding M.D.   On: 07/15/2014 01:48

## 2014-08-02 LAB — BASIC METABOLIC PANEL
BUN: 9 mg/dL (ref 6–23)
CALCIUM: 8.6 mg/dL (ref 8.4–10.5)
CO2: 26 mEq/L (ref 19–32)
CREATININE: 0.86 mg/dL (ref 0.50–1.35)
Chloride: 99 mEq/L (ref 96–112)
GLUCOSE: 117 mg/dL — AB (ref 70–99)
POTASSIUM: 4 meq/L (ref 3.5–5.3)
Sodium: 135 mEq/L (ref 135–145)

## 2014-08-08 ENCOUNTER — Encounter: Payer: Self-pay | Admitting: Physician Assistant

## 2014-09-06 ENCOUNTER — Ambulatory Visit: Payer: Medicare Other | Admitting: Physician Assistant

## 2014-09-08 ENCOUNTER — Inpatient Hospital Stay (HOSPITAL_COMMUNITY)
Admission: EM | Admit: 2014-09-08 | Discharge: 2014-09-15 | DRG: 193 | Disposition: A | Discharge status: Home Health | Payer: Medicare Other | Attending: Internal Medicine | Admitting: Internal Medicine

## 2014-09-08 ENCOUNTER — Emergency Department (HOSPITAL_COMMUNITY): Payer: Medicare Other

## 2014-09-08 ENCOUNTER — Encounter (HOSPITAL_COMMUNITY): Payer: Self-pay

## 2014-09-08 DIAGNOSIS — I2510 Atherosclerotic heart disease of native coronary artery without angina pectoris: Secondary | ICD-10-CM | POA: Diagnosis present

## 2014-09-08 DIAGNOSIS — F419 Anxiety disorder, unspecified: Secondary | ICD-10-CM | POA: Diagnosis present

## 2014-09-08 DIAGNOSIS — J189 Pneumonia, unspecified organism: Secondary | ICD-10-CM | POA: Diagnosis present

## 2014-09-08 DIAGNOSIS — W19XXXA Unspecified fall, initial encounter: Secondary | ICD-10-CM | POA: Diagnosis present

## 2014-09-08 DIAGNOSIS — G8929 Other chronic pain: Secondary | ICD-10-CM | POA: Diagnosis present

## 2014-09-08 DIAGNOSIS — Z7901 Long term (current) use of anticoagulants: Secondary | ICD-10-CM

## 2014-09-08 DIAGNOSIS — E1165 Type 2 diabetes mellitus with hyperglycemia: Secondary | ICD-10-CM | POA: Diagnosis present

## 2014-09-08 DIAGNOSIS — Z8249 Family history of ischemic heart disease and other diseases of the circulatory system: Secondary | ICD-10-CM

## 2014-09-08 DIAGNOSIS — I4891 Unspecified atrial fibrillation: Secondary | ICD-10-CM | POA: Diagnosis present

## 2014-09-08 DIAGNOSIS — R569 Unspecified convulsions: Secondary | ICD-10-CM | POA: Diagnosis present

## 2014-09-08 DIAGNOSIS — M25552 Pain in left hip: Secondary | ICD-10-CM | POA: Diagnosis present

## 2014-09-08 DIAGNOSIS — R05 Cough: Secondary | ICD-10-CM | POA: Diagnosis present

## 2014-09-08 DIAGNOSIS — J9620 Acute and chronic respiratory failure, unspecified whether with hypoxia or hypercapnia: Secondary | ICD-10-CM | POA: Diagnosis present

## 2014-09-08 DIAGNOSIS — E119 Type 2 diabetes mellitus without complications: Secondary | ICD-10-CM

## 2014-09-08 DIAGNOSIS — I480 Paroxysmal atrial fibrillation: Secondary | ICD-10-CM | POA: Diagnosis present

## 2014-09-08 DIAGNOSIS — J9621 Acute and chronic respiratory failure with hypoxia: Secondary | ICD-10-CM | POA: Diagnosis present

## 2014-09-08 DIAGNOSIS — K219 Gastro-esophageal reflux disease without esophagitis: Secondary | ICD-10-CM | POA: Diagnosis present

## 2014-09-08 DIAGNOSIS — M25559 Pain in unspecified hip: Secondary | ICD-10-CM

## 2014-09-08 DIAGNOSIS — R0902 Hypoxemia: Secondary | ICD-10-CM

## 2014-09-08 DIAGNOSIS — R0602 Shortness of breath: Secondary | ICD-10-CM

## 2014-09-08 DIAGNOSIS — E780 Pure hypercholesterolemia: Secondary | ICD-10-CM | POA: Diagnosis present

## 2014-09-08 DIAGNOSIS — R042 Hemoptysis: Secondary | ICD-10-CM | POA: Diagnosis present

## 2014-09-08 DIAGNOSIS — Z9981 Dependence on supplemental oxygen: Secondary | ICD-10-CM

## 2014-09-08 DIAGNOSIS — F17210 Nicotine dependence, cigarettes, uncomplicated: Secondary | ICD-10-CM | POA: Diagnosis present

## 2014-09-08 DIAGNOSIS — Z833 Family history of diabetes mellitus: Secondary | ICD-10-CM | POA: Diagnosis not present

## 2014-09-08 DIAGNOSIS — J479 Bronchiectasis, uncomplicated: Secondary | ICD-10-CM | POA: Diagnosis present

## 2014-09-08 DIAGNOSIS — J441 Chronic obstructive pulmonary disease with (acute) exacerbation: Secondary | ICD-10-CM | POA: Diagnosis present

## 2014-09-08 DIAGNOSIS — F17200 Nicotine dependence, unspecified, uncomplicated: Secondary | ICD-10-CM | POA: Diagnosis present

## 2014-09-08 DIAGNOSIS — R739 Hyperglycemia, unspecified: Secondary | ICD-10-CM | POA: Diagnosis present

## 2014-09-08 DIAGNOSIS — J449 Chronic obstructive pulmonary disease, unspecified: Secondary | ICD-10-CM | POA: Diagnosis present

## 2014-09-08 DIAGNOSIS — R059 Cough, unspecified: Secondary | ICD-10-CM

## 2014-09-08 HISTORY — DX: Pneumonia, unspecified organism: J18.9

## 2014-09-08 HISTORY — DX: Type 2 diabetes mellitus without complications: E11.9

## 2014-09-08 LAB — COMPREHENSIVE METABOLIC PANEL
ALT: 16 U/L (ref 0–53)
ANION GAP: 7 (ref 5–15)
AST: 19 U/L (ref 0–37)
Albumin: 3.1 g/dL — ABNORMAL LOW (ref 3.5–5.2)
Alkaline Phosphatase: 65 U/L (ref 39–117)
BUN: 10 mg/dL (ref 6–23)
CALCIUM: 8.2 mg/dL — AB (ref 8.4–10.5)
CO2: 25 mmol/L (ref 19–32)
Chloride: 103 mmol/L (ref 96–112)
Creatinine, Ser: 0.94 mg/dL (ref 0.50–1.35)
GLUCOSE: 178 mg/dL — AB (ref 70–99)
Potassium: 3.8 mmol/L (ref 3.5–5.1)
Sodium: 135 mmol/L (ref 135–145)
TOTAL PROTEIN: 7.1 g/dL (ref 6.0–8.3)
Total Bilirubin: 0.3 mg/dL (ref 0.3–1.2)

## 2014-09-08 LAB — CBC WITH DIFFERENTIAL/PLATELET
BASOS ABS: 0 10*3/uL (ref 0.0–0.1)
BASOS PCT: 0 % (ref 0–1)
Eosinophils Absolute: 0.1 10*3/uL (ref 0.0–0.7)
Eosinophils Relative: 1 % (ref 0–5)
HCT: 41.7 % (ref 39.0–52.0)
Hemoglobin: 13.7 g/dL (ref 13.0–17.0)
Lymphocytes Relative: 24 % (ref 12–46)
Lymphs Abs: 1.9 10*3/uL (ref 0.7–4.0)
MCH: 26.6 pg (ref 26.0–34.0)
MCHC: 32.9 g/dL (ref 30.0–36.0)
MCV: 81 fL (ref 78.0–100.0)
MONOS PCT: 8 % (ref 3–12)
Monocytes Absolute: 0.6 10*3/uL (ref 0.1–1.0)
NEUTROS ABS: 5.1 10*3/uL (ref 1.7–7.7)
NEUTROS PCT: 67 % (ref 43–77)
Platelets: 251 10*3/uL (ref 150–400)
RBC: 5.15 MIL/uL (ref 4.22–5.81)
RDW: 14 % (ref 11.5–15.5)
WBC: 7.7 10*3/uL (ref 4.0–10.5)

## 2014-09-08 LAB — I-STAT CG4 LACTIC ACID, ED
Lactic Acid, Venous: 1.62 mmol/L (ref 0.5–2.0)
Lactic Acid, Venous: 5.16 mmol/L (ref 0.5–2.0)

## 2014-09-08 LAB — URINALYSIS, ROUTINE W REFLEX MICROSCOPIC
BILIRUBIN URINE: NEGATIVE
Glucose, UA: 500 mg/dL — AB
Hgb urine dipstick: NEGATIVE
Ketones, ur: NEGATIVE mg/dL
Leukocytes, UA: NEGATIVE
NITRITE: NEGATIVE
PH: 5.5 (ref 5.0–8.0)
Protein, ur: NEGATIVE mg/dL
Specific Gravity, Urine: 1.02 (ref 1.005–1.030)
UROBILINOGEN UA: 0.2 mg/dL (ref 0.0–1.0)

## 2014-09-08 LAB — PROTIME-INR
INR: 1.01 (ref 0.00–1.49)
PROTHROMBIN TIME: 13.4 s (ref 11.6–15.2)

## 2014-09-08 LAB — MRSA PCR SCREENING: MRSA by PCR: NEGATIVE

## 2014-09-08 LAB — STREP PNEUMONIAE URINARY ANTIGEN: STREP PNEUMO URINARY ANTIGEN: NEGATIVE

## 2014-09-08 MED ORDER — HYDROMORPHONE HCL 1 MG/ML IJ SOLN
1.0000 mg | Freq: Once | INTRAMUSCULAR | Status: AC
  Administered 2014-09-08: 1 mg via INTRAVENOUS
  Filled 2014-09-08: qty 1

## 2014-09-08 MED ORDER — ONDANSETRON HCL 4 MG/2ML IJ SOLN
INTRAMUSCULAR | Status: AC
  Filled 2014-09-08: qty 2

## 2014-09-08 MED ORDER — SODIUM CHLORIDE 0.9 % IV BOLUS (SEPSIS)
1000.0000 mL | Freq: Once | INTRAVENOUS | Status: AC
  Administered 2014-09-08: 1000 mL via INTRAVENOUS

## 2014-09-08 MED ORDER — BUDESONIDE-FORMOTEROL FUMARATE 80-4.5 MCG/ACT IN AERO
2.0000 | INHALATION_SPRAY | Freq: Two times a day (BID) | RESPIRATORY_TRACT | Status: DC
  Administered 2014-09-08 – 2014-09-11 (×6): 2 via RESPIRATORY_TRACT
  Filled 2014-09-08: qty 6.9

## 2014-09-08 MED ORDER — SODIUM CHLORIDE 0.9 % IV SOLN
250.0000 mL | INTRAVENOUS | Status: DC | PRN
  Administered 2014-09-08: 250 mL via INTRAVENOUS

## 2014-09-08 MED ORDER — METHYLPREDNISOLONE SODIUM SUCC 125 MG IJ SOLR
125.0000 mg | Freq: Once | INTRAMUSCULAR | Status: AC
  Administered 2014-09-08: 125 mg via INTRAVENOUS
  Filled 2014-09-08: qty 2

## 2014-09-08 MED ORDER — SENNOSIDES-DOCUSATE SODIUM 8.6-50 MG PO TABS: 1.0000 | ORAL_TABLET | Freq: Every evening | ORAL | Status: DC | PRN

## 2014-09-08 MED ORDER — ALBUTEROL SULFATE (2.5 MG/3ML) 0.083% IN NEBU
2.5000 mg | INHALATION_SOLUTION | RESPIRATORY_TRACT | Status: DC | PRN
  Filled 2014-09-08: qty 3

## 2014-09-08 MED ORDER — SODIUM CHLORIDE 0.9 % IJ SOLN: 3.0000 mL | INTRAMUSCULAR | Status: DC | PRN

## 2014-09-08 MED ORDER — DILTIAZEM HCL 25 MG/5ML IV SOLN
10.0000 mg | Freq: Once | INTRAVENOUS | Status: AC
  Administered 2014-09-08: 10 mg via INTRAVENOUS
  Filled 2014-09-08: qty 5

## 2014-09-08 MED ORDER — TRAZODONE HCL 50 MG PO TABS
25.0000 mg | ORAL_TABLET | Freq: Every evening | ORAL | Status: DC | PRN
  Administered 2014-09-12 – 2014-09-14 (×3): 25 mg via ORAL
  Filled 2014-09-08 (×3): qty 1

## 2014-09-08 MED ORDER — ACETAMINOPHEN 325 MG PO TABS
650.0000 mg | ORAL_TABLET | Freq: Four times a day (QID) | ORAL | Status: DC | PRN
Start: 2014-09-08 — End: 2014-09-15
  Administered 2014-09-12: 650 mg via ORAL
  Filled 2014-09-08: qty 2

## 2014-09-08 MED ORDER — DILTIAZEM HCL 25 MG/5ML IV SOLN: 5.0000 mg | Freq: Once | INTRAVENOUS | Status: DC

## 2014-09-08 MED ORDER — DILTIAZEM HCL ER COATED BEADS 180 MG PO CP24
180.0000 mg | ORAL_CAPSULE | Freq: Every day | ORAL | Status: DC
  Administered 2014-09-08 – 2014-09-15 (×8): 180 mg via ORAL
  Filled 2014-09-08 (×9): qty 1

## 2014-09-08 MED ORDER — SODIUM CHLORIDE 0.9 % IV SOLN: INTRAVENOUS | Status: DC

## 2014-09-08 MED ORDER — ALBUTEROL SULFATE (2.5 MG/3ML) 0.083% IN NEBU
2.5000 mg | INHALATION_SOLUTION | Freq: Four times a day (QID) | RESPIRATORY_TRACT | Status: DC
  Administered 2014-09-09 (×2): 2.5 mg via RESPIRATORY_TRACT
  Filled 2014-09-08 (×2): qty 3

## 2014-09-08 MED ORDER — ONDANSETRON HCL 4 MG/2ML IJ SOLN
4.0000 mg | Freq: Once | INTRAMUSCULAR | Status: AC
  Administered 2014-09-08: 4 mg via INTRAVENOUS

## 2014-09-08 MED ORDER — NITROGLYCERIN 0.4 MG SL SUBL: 0.4000 mg | SUBLINGUAL_TABLET | SUBLINGUAL | Status: DC | PRN

## 2014-09-08 MED ORDER — DEXTROSE 5 % IV SOLN
500.0000 mg | INTRAVENOUS | Status: DC
  Administered 2014-09-09 – 2014-09-10 (×2): 500 mg via INTRAVENOUS
  Filled 2014-09-08 (×3): qty 500

## 2014-09-08 MED ORDER — ALBUTEROL SULFATE (2.5 MG/3ML) 0.083% IN NEBU
2.5000 mg | INHALATION_SOLUTION | RESPIRATORY_TRACT | Status: DC
  Administered 2014-09-08 (×2): 2.5 mg via RESPIRATORY_TRACT
  Filled 2014-09-08: qty 3

## 2014-09-08 MED ORDER — CEFTRIAXONE SODIUM IN DEXTROSE 20 MG/ML IV SOLN
1.0000 g | INTRAVENOUS | Status: DC
  Filled 2014-09-08 (×3): qty 50

## 2014-09-08 MED ORDER — HYDROCODONE-ACETAMINOPHEN 5-325 MG PO TABS
1.0000 | ORAL_TABLET | ORAL | Status: DC | PRN
  Administered 2014-09-08 – 2014-09-09 (×5): 2 via ORAL
  Filled 2014-09-08 (×5): qty 2

## 2014-09-08 MED ORDER — CEFTRIAXONE SODIUM IN DEXTROSE 20 MG/ML IV SOLN
1.0000 g | INTRAVENOUS | Status: DC
  Administered 2014-09-09 – 2014-09-15 (×7): 1 g via INTRAVENOUS
  Filled 2014-09-08: qty 50
  Filled 2014-09-08: qty 10
  Filled 2014-09-08: qty 50
  Filled 2014-09-08: qty 10
  Filled 2014-09-08 (×6): qty 50
  Filled 2014-09-08: qty 20

## 2014-09-08 MED ORDER — ALUM & MAG HYDROXIDE-SIMETH 200-200-20 MG/5ML PO SUSP
30.0000 mL | Freq: Four times a day (QID) | ORAL | Status: DC | PRN
  Administered 2014-09-09 – 2014-09-13 (×6): 30 mL via ORAL
  Filled 2014-09-08 (×6): qty 30

## 2014-09-08 MED ORDER — DILTIAZEM HCL 100 MG IV SOLR
5.0000 mg/h | INTRAVENOUS | Status: DC
  Administered 2014-09-08: 13 mg/h via INTRAVENOUS
  Administered 2014-09-08 – 2014-09-09 (×2): 5 mg/h via INTRAVENOUS
  Filled 2014-09-08: qty 100

## 2014-09-08 MED ORDER — INSULIN ASPART 100 UNIT/ML ~~LOC~~ SOLN
0.0000 [IU] | Freq: Three times a day (TID) | SUBCUTANEOUS | Status: DC
  Administered 2014-09-08: 15 [IU] via SUBCUTANEOUS
  Administered 2014-09-09 – 2014-09-10 (×4): 3 [IU] via SUBCUTANEOUS
  Administered 2014-09-10: 2 [IU] via SUBCUTANEOUS
  Administered 2014-09-10 – 2014-09-11 (×2): 8 [IU] via SUBCUTANEOUS
  Administered 2014-09-11: 5 [IU] via SUBCUTANEOUS
  Administered 2014-09-11: 3 [IU] via SUBCUTANEOUS
  Administered 2014-09-12: 11 [IU] via SUBCUTANEOUS

## 2014-09-08 MED ORDER — ONDANSETRON HCL 4 MG/2ML IJ SOLN
4.0000 mg | Freq: Four times a day (QID) | INTRAMUSCULAR | Status: DC | PRN
Start: 2014-09-08 — End: 2014-09-15

## 2014-09-08 MED ORDER — INSULIN ASPART 100 UNIT/ML ~~LOC~~ SOLN
0.0000 [IU] | Freq: Every day | SUBCUTANEOUS | Status: DC
  Administered 2014-09-08 – 2014-09-11 (×2): 4 [IU] via SUBCUTANEOUS

## 2014-09-08 MED ORDER — ALBUTEROL (5 MG/ML) CONTINUOUS INHALATION SOLN
15.0000 mg/h | INHALATION_SOLUTION | Freq: Once | RESPIRATORY_TRACT | Status: AC
  Administered 2014-09-08: 15 mg/h via RESPIRATORY_TRACT
  Filled 2014-09-08: qty 20

## 2014-09-08 MED ORDER — SODIUM CHLORIDE 0.9 % IJ SOLN
3.0000 mL | Freq: Two times a day (BID) | INTRAMUSCULAR | Status: DC
  Administered 2014-09-08 – 2014-09-15 (×14): 3 mL via INTRAVENOUS

## 2014-09-08 MED ORDER — FLUTICASONE PROPIONATE 50 MCG/ACT NA SUSP
2.0000 | Freq: Every day | NASAL | Status: DC
  Administered 2014-09-08 – 2014-09-15 (×8): 2 via NASAL
  Filled 2014-09-08 (×2): qty 16

## 2014-09-08 MED ORDER — METHYLPREDNISOLONE SODIUM SUCC 125 MG IJ SOLR
60.0000 mg | Freq: Two times a day (BID) | INTRAMUSCULAR | Status: DC
  Administered 2014-09-08 – 2014-09-10 (×4): 60 mg via INTRAVENOUS
  Filled 2014-09-08 (×4): qty 2

## 2014-09-08 MED ORDER — DEXTROSE 5 % IV SOLN
500.0000 mg | Freq: Once | INTRAVENOUS | Status: AC
  Administered 2014-09-08: 500 mg via INTRAVENOUS
  Filled 2014-09-08: qty 500

## 2014-09-08 MED ORDER — MORPHINE SULFATE 2 MG/ML IJ SOLN
1.0000 mg | INTRAMUSCULAR | Status: DC | PRN
  Administered 2014-09-11 – 2014-09-15 (×18): 1 mg via INTRAVENOUS
  Filled 2014-09-08 (×18): qty 1

## 2014-09-08 MED ORDER — DEXTROSE 5 % IV SOLN
500.0000 mg | INTRAVENOUS | Status: DC
  Filled 2014-09-08 (×3): qty 500

## 2014-09-08 MED ORDER — ENOXAPARIN SODIUM 40 MG/0.4ML ~~LOC~~ SOLN
40.0000 mg | SUBCUTANEOUS | Status: DC
  Administered 2014-09-08 – 2014-09-14 (×7): 40 mg via SUBCUTANEOUS
  Filled 2014-09-08 (×7): qty 0.4

## 2014-09-08 MED ORDER — ACETAMINOPHEN 650 MG RE SUPP: 650.0000 mg | Freq: Four times a day (QID) | RECTAL | Status: DC | PRN

## 2014-09-08 MED ORDER — ONDANSETRON HCL 4 MG PO TABS: 4.0000 mg | ORAL_TABLET | Freq: Four times a day (QID) | ORAL | Status: DC | PRN

## 2014-09-08 MED ORDER — DEXTROSE 5 % IV SOLN
1.0000 g | Freq: Once | INTRAVENOUS | Status: AC
  Administered 2014-09-08: 1 g via INTRAVENOUS
  Filled 2014-09-08: qty 10

## 2014-09-08 MED ORDER — TIOTROPIUM BROMIDE MONOHYDRATE 18 MCG IN CAPS
18.0000 ug | ORAL_CAPSULE | Freq: Every day | RESPIRATORY_TRACT | Status: DC
  Administered 2014-09-09 – 2014-09-15 (×6): 18 ug via RESPIRATORY_TRACT
  Filled 2014-09-08 (×2): qty 5

## 2014-09-08 NOTE — ED Notes (Signed)
Dr. Mauri Brooklyn notified of Lactate level and increased HR of 180-190.  I am waiting for PO Cardizem at this time.

## 2014-09-08 NOTE — ED Notes (Signed)
Dr. Leonides Schanz made aware of Lactate level.  Orrder recv'd. Paged admitting MD.

## 2014-09-08 NOTE — Care Management Utilization Note (Signed)
UR completed 

## 2014-09-08 NOTE — ED Notes (Signed)
Pt reports approx 1 month ago was diagnosed with pneumonia and took antibiotics for 10 days.  Pt says for the past month has had chills, night sweats, and coughing up blood.  Pt says this morning felt weak all over and fell when he got out of bed.  Pt says fell to hands and knees and now c/o pain in middle of back, neck, and chest.

## 2014-09-08 NOTE — ED Provider Notes (Signed)
This chart was scribed for Robins, DO by Einar Pheasant, ED Scribe. This patient was seen in room APA07/APA07 and the patient's care was started at 9:23 AM.  TIME SEEN: 9:23AM  CHIEF COMPLAINT: Fall  HPI: HPI Comments: Ryan [name] is a 54 y.o. male with PMhx of COPD not on O2, CAD, diabetes, hyperlipidemia, atrial fibrillation on Savaysa presents to the Emergency Department complaining of a fall that occurred this morning. Pt states that he was trying to get out of bed when he started feeling weak all over and fell to the floor. States he landed on his knees and hands. Did not hit his head. He states that he was diagnosed with pneumonia 1 month ago and has been on antibiotics. States that he is not feeling any better. Reports hemoptysis, blood-streaked sputum and coughing up small "globs" of blood. For the past 1 month he has been experiencing chills, diaphoresis, and coughing up blood. Denies chest pain. Pt admits to being incarcerated 40 years ago but no other risk factors for TB including history of HIV, IV drug abuse, homelessness, recent travel, prior history of TB. Denies history of PE or DVT. No recent prolonged immobilization, fracture, surgery, trauma. No lower extremity swelling or pain. Denies focal numbness, tingling or focal weakness. Reports he stopped his anticoagulation 8 days ago because of his proptosis.  ROS: See HPI Constitutional: no fever; positive chills and diaphoresis Eyes: no drainage  ENT: no runny nose   Cardiovascular:  no chest pain  Resp: SOB; positive cough GI: no vomiting GU: no dysuria Integumentary: no rash  Allergy: no hives  Musculoskeletal: no leg swelling  Neurological: no slurred speech ROS otherwise negative  PAST MEDICAL HISTORY/PAST SURGICAL HISTORY:  Past Medical History  Diagnosis Date  . COPD (chronic obstructive pulmonary disease)   . Hypercholesteremia   . History of pneumonia   . Polycythemia   . Coronary atherosclerosis of  native coronary artery     Mild nonobstructive disease at catheterization 2007  . 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 Borderline diabetes   . Seizures     Last seizure 2 yrs ago  . GERD (gastroesophageal reflux disease)   . Type 2 diabetes mellitus   . DDD (degenerative disc disease)     Cervical and thoracic  . Chronic back pain   . Bronchitis   . Chronic left shoulder pain   . Chronic neck pain   . Atrial fibrillation     Not anticoagulated  . Smoker     MEDICATIONS:  Prior to Admission medications   Medication Sig Start Date End Date Taking? Authorizing Provider  albuterol (PROVENTIL) (2.5 MG/3ML) 0.083% nebulizer solution Take 2.5 mg by nebulization every 6 (six) hours as needed for wheezing or shortness of breath.    Historical Provider, MD  albuterol (VENTOLIN HFA) 108 (90 BASE) MCG/ACT inhaler Inhale 2 puffs into the lungs every 4 (four) hours as needed. Shortness of breath    Historical Provider, MD  budesonide-formoterol (SYMBICORT) 80-4.5 MCG/ACT inhaler Inhale 2 puffs into the lungs 2 (two) times daily. 10/21/13   Charlynne Cousins, MD  diltiazem (CARDIZEM CD) 120 MG 24 hr capsule Take 1 capsule (120 mg total) by mouth daily. 08/01/14   Lendon Colonel, NP  edoxaban (SAVAYSA) 60 MG TABS tablet Take 60 mg by mouth daily. 06/26/14   Lendon Colonel, NP  fluticasone Asencion Islam) 50 MCG/ACT nasal spray Place 2 sprays into  both nostrils daily.    Historical Provider, MD  Fluticasone-Salmeterol (ADVAIR DISKUS) 250-50 MCG/DOSE AEPB Inhale 1 puff into the lungs every 12 (twelve) hours.     Historical Provider, MD  nitroGLYCERIN (NITROSTAT) 0.4 MG SL tablet Place 1 tablet (0.4 mg total) under the tongue every 5 (five) minutes as needed for chest pain. 04/22/13   Lendon Colonel, NP  tiotropium (SPIRIVA) 18 MCG inhalation capsule Place 18 mcg into inhaler and inhale daily.    Historical Provider, MD    ALLERGIES:  Allergies  Allergen  Reactions  . Influenza Vaccine Live Swelling    SOCIAL HISTORY:  History  Substance Use Topics  . Smoking status: Current Every Day Smoker -- 0.50 packs/day for 45 years    Types: Cigarettes    Start date: 12/07/1965  . Smokeless tobacco: Never Used     Comment: started smoking half pack 2 days ago per stopped in the hospital as of 07-20-2013  . Alcohol Use: No    FAMILY HISTORY: Family History  Problem Relation Age of Onset  . Hypertension Mother   . Diabetes Mother   . Heart attack Mother   . Heart failure Mother   . Hypertension Sister   . Diabetes Sister   . Heart failure Sister     EXAM: BP 116/81 mmHg  Pulse 84  Temp(Src) 98 F (36.7 C) (Oral)  Resp 22  Ht 5\' 7"  (1.702 m)  Wt 140 lb (63.504 kg)  BMI 21.92 kg/m2  SpO2 91% CONSTITUTIONAL: Alert and oriented and responds appropriately to questions. Chronically ill appearing but in no apperant distress; well-nourished; GCS 15 HEAD: Normocephalic; atraumatic EYES: Conjunctivae clear, PERRL, EOMI ENT: normal nose; no rhinorrhea; moist mucous membranes; pharynx without lesions noted; no dental injury; no hemotypanum; no septal hematoma NECK: Supple, no meningismus, no LAD; no midline spinal tenderness, step-off or deformity CARD: RRR; S1 and S2 appreciated; no murmurs, no clicks, no rubs, no gallops RESP: Normal chest excursion without splinting or tachypnea; Dimineshed breath sounds with prolonged expiratory phase. Mild wheezing equal bilaterally; no rhonchi, no rales; chest wall stable, nontender to palpation ABD/GI: Normal bowel sounds; non-distended; soft, non-tender, no rebound, no guarding PELVIS:  stable, nontender to palpation BACK:  The back appears normal and is non-tender to palpation, there is no CVA tenderness; no midline spinal tenderness, step-off or deformity EXT: Normal ROM in all joints; non-tender to palpation; no edema; normal capillary refill; no cyanosis    SKIN: Normal color for age and race;  warm NEURO: Moves all extremities equally, sensation to light touch intact diffusely, cranial nerves II-12 intact PSYCH: The patient's mood and manner are appropriate. Grooming and personal hygiene are appropriate.  MEDICAL DECISION MAKING: Patient here with mild hypoxia concerning for COPD exacerbation versus pneumonia. We'll obtain labs, chest x-ray. We'll give Solu-Medrol and continuous albuterol treatment. Anticipate admission. EKG shows normal sinus rhythm without ischemic changes.  ED PROGRESS:   Labs show no leukocytosis and negative troponin. Lactate normal. Urinalysis shows no sign of infection. Chest x-ray shows an infiltrate and apical septum of the left upper lobe. Given his COPD exacerbation with this daily acquired pneumonia and mild hypoxia will admit. Discuss with Dr. Jerilee Hoh.   2:00 PM  Pt now in atrial fibrillation with RVR. Has a history of atrial fibrillation. Has not had his Cardizem today. Likely secondary to this and albuterol. Has been given Cardizem bolus and oral Cardizem. Lactate is also elevated. We are hydrating patient. He states that he is still  feeling better. No chest pain. Mentating normally. Nontoxic and in no distress.    EKG Interpretation  Date/Time:  Friday September 08 2014 09:26:29 EST Ventricular Rate:  78 PR Interval:  123 QRS Duration: 88 QT Interval:  369 QTC Calculation: 420 R Axis:   80 Text Interpretation:  Sinus rhythm RSR' in V1 or V2, probably normal variant No significant change since last tracing Confirmed by WARD,  DO, KRISTEN [phone]) on 09/08/2014 9:36:52 AM         EKG Interpretation  Date/Time:  Friday September 08 2014 13:59:20 EST Ventricular Rate:  181 PR Interval:  123 QRS Duration: 77 QT Interval:  278 QTC Calculation: 482 R Axis:   78 Text Interpretation:  Atrial fibrillation with rapid V-rate RSR' in V1 or V2, probably normal variant ST depression, probably rate related Confirmed by WARD,  DO, KRISTEN ([id number]) on  09/08/2014 2:09:38 PM        CRITICAL CARE Performed by: Nyra Jabs   Total critical care time: 40 minutes  Critical care time was exclusive of separately billable procedures and treating other patients.  Critical care was necessary to treat or prevent imminent or life-threatening deterioration.  Critical care was time spent personally by me on the following activities: development of treatment plan with patient and/or surrogate as well as nursing, discussions with consultants, evaluation of patient's response to treatment, examination of patient, obtaining history from patient or surrogate, ordering and performing treatments and interventions, ordering and review of laboratory studies, ordering and review of radiographic studies, pulse oximetry and re-evaluation of patient's condition.     I personally performed the services described in this documentation, which was scribed in my presence. The recorded information has been reviewed and is accurate.   [place], DO 09/08/14 1557

## 2014-09-08 NOTE — ED Notes (Signed)
Pt placed on 2liters [place] for comfort. nad noted.

## 2014-09-08 NOTE — Progress Notes (Signed)
ANTIBIOTIC CONSULT NOTE - INITIAL  Pharmacy Consult for Rocephin Indication: CAP  Allergies  Allergen Reactions  . Influenza Vaccine Live Swelling   Patient Measurements: Height: 5\' 7"  (170.2 cm) Weight: 140 lb (63.504 kg) IBW/kg (Calculated) : 66.1  Vital Signs: Temp: 98.4 F (36.9 C) (02/26 1330) Temp Source: Oral (02/26 1330) BP: 110/75 mmHg (02/26 1430) Pulse Rate: 141 (02/26 1430) Intake/Output from previous day:   Intake/Output from this shift:    Labs:  Recent Labs  09/08/14 0942  WBC 7.7  HGB 13.7  PLT 251  CREATININE 0.94   Estimated Creatinine Clearance: 79.8 mL/min (by C-G formula based on Cr of 0.94). No results for input(s): VANCOTROUGH, VANCOPEAK, VANCORANDOM, GENTTROUGH, GENTPEAK, GENTRANDOM, TOBRATROUGH, TOBRAPEAK, TOBRARND, AMIKACINPEAK, AMIKACINTROU, AMIKACIN in the last 72 hours.   Microbiology: No results found for this or any previous visit (from the past 720 hour(s)).  Medical History: Past Medical History  Diagnosis Date  . COPD (chronic obstructive pulmonary disease)   . Hypercholesteremia   . History of pneumonia   . Polycythemia   . Coronary atherosclerosis of native coronary artery     Mild nonobstructive disease at catheterization 2007  . 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 Borderline diabetes   . Seizures     Last seizure 2 yrs ago  . GERD (gastroesophageal reflux disease)   . Type 2 diabetes mellitus   . DDD (degenerative disc disease)     Cervical and thoracic  . Chronic back pain   . Bronchitis   . Chronic left shoulder pain   . Chronic neck pain   . Atrial fibrillation     Not anticoagulated  . Smoker    Anti-infectives    Start     Dose/Rate Route Frequency Ordered Stop   09/08/14 1800  cefTRIAXone (ROCEPHIN) 1 g in dextrose 5 % 50 mL IVPB - Premix     1 g 100 mL/hr over 30 Minutes Intravenous Every 24 hours 09/08/14 1620     09/08/14 1730  azithromycin (ZITHROMAX) 500 mg  in dextrose 5 % 250 mL IVPB     500 mg 250 mL/hr over 60 Minutes Intravenous Every 24 hours 09/08/14 1610     09/08/14 1100  cefTRIAXone (ROCEPHIN) 1 g in dextrose 5 % 50 mL IVPB     1 g 100 mL/hr over 30 Minutes Intravenous  Once 09/08/14 1053 09/08/14 1341   09/08/14 1100  azithromycin (ZITHROMAX) 500 mg in dextrose 5 % 250 mL IVPB     500 mg 250 mL/hr over 60 Minutes Intravenous  Once 09/08/14 1053 09/08/14 1257     Assessment: 54yo male with good renal fxn.  Pt started on Zithromax and Rocephin for CAP.  Goal of Therapy:  Eradicate infection.  Plan:  Rocephin 1gm IV q24hrs Monitor labs and progress  Hart Robinsons A 09/08/2014,4:20 PM

## 2014-09-08 NOTE — H&P (Signed)
[facility] History and Physical  Thong Feeny Zehren ZDG:[id number] DOB: 10/27/1960 DOA: 09/08/2014  Referring physician:  PCP: [name], PA-C   Chief Complaint: Persistent worsening shortness of breath  HPI: Ryan [name] is a 54 y.o. male with a past medical history that includes COPD on oxygen at home at 2 L, assistance/reoccurring pneumonia since December 2015, cavitary lesion of the lung, GERD, seizures, diabetes, chronic back pain, A. fib stopped taking his anticoagulation medicine 8 days ago presents to the emergency department with chief complaint of persistent and worsening shortness of breath. Initial evaluation reveals Infiltrate in the apical segment of the left upper lobe. Underlying emphysema with areas of scarring and bibasilar interstitial Fibrosis.  Patient reports persistent/recurrent pneumonia since December 2014. He does admit to continuing to smoke. He states that is about a week ago he was diagnosed with pneumonia in the outpatient setting was given antibiotics for 10 days which she completed. Associated symptoms include subjective fever chills productive cough with blood-tinged sputum. When he noticed blood-tinged sputum he stopped his savaysa. He denies pain palpitations headache dizziness syncope or near-syncope. He does endorse some generalized weakness decreased appetite and mild unintentional weight loss. Workup in the emergency room includes a complete blood count is unremarkable and basic metabolic panel significant for serum glucose of 178. Chest x-ray as noted above. He is afebrile and hemodynamically stable and hypoxic. In the emergency department he is provided with nebulizers antibiotics and steroids. He's also given 1 L fluid and Dilaudid for pain  Review of Systems:  10 point review of systems complete and all systems are negative except as indicated in the history of present illness   Past Medical History  Diagnosis Date  . COPD (chronic obstructive  pulmonary disease)   . Hypercholesteremia   . History of pneumonia   . Polycythemia   . Coronary atherosclerosis of native coronary artery     Mild nonobstructive disease at catheterization 2007  . 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 Borderline diabetes   . Seizures     Last seizure 2 yrs ago  . GERD (gastroesophageal reflux disease)   . Type 2 diabetes mellitus   . DDD (degenerative disc disease)     Cervical and thoracic  . Chronic back pain   . Bronchitis   . Chronic left shoulder pain   . Chronic neck pain   . Atrial fibrillation     Not anticoagulated  . Smoker    Past Surgical History  Procedure Laterality Date  . Colonoscopy w/ endoscopic [place]    . Vasectomy    . Throat biopsy    . Lung biopsy    . Vasectomy  1987   Social History:  reports that he has been smoking Cigarettes.  He started smoking about 48 years ago. He has a 22.5 pack-year smoking history. He has never used smokeless tobacco. He reports that he does not drink alcohol or use illicit drugs. Patient is on oxygen 24 7 at home he continues to smoke. He is fairly independent with ADLs Allergies  Allergen Reactions  . Influenza Vaccine Live Swelling    Family History  Problem Relation Age of Onset  . Hypertension Mother   . Diabetes Mother   . Heart attack Mother   . Heart failure Mother   . Hypertension Sister   . Diabetes Sister   . Heart failure Sister     Prior to Admission medications  Medication Sig Start Date End Date Taking? Authorizing Provider  albuterol (PROVENTIL) (2.5 MG/3ML) 0.083% nebulizer solution Take 2.5 mg by nebulization every 6 (six) hours as needed for wheezing or shortness of breath.   Yes Historical Provider, MD  budesonide-formoterol (SYMBICORT) 80-4.5 MCG/ACT inhaler Inhale 2 puffs into the lungs 2 (two) times daily. 10/21/13  Yes Charlynne Cousins, MD  diltiazem (CARDIZEM CD) 180 MG 24 hr capsule Take 180 mg by mouth daily. 06/18/14   Yes Historical Provider, MD  fluticasone (FLONASE) 50 MCG/ACT nasal spray Place 2 sprays into both nostrils daily.   Yes Historical Provider, MD  Fluticasone-Salmeterol (ADVAIR DISKUS) 250-50 MCG/DOSE AEPB Inhale 1 puff into the lungs every 12 (twelve) hours.    Yes Historical Provider, MD  tiotropium (SPIRIVA) 18 MCG inhalation capsule Place 18 mcg into inhaler and inhale daily.   Yes Historical Provider, MD  albuterol (VENTOLIN HFA) 108 (90 BASE) MCG/ACT inhaler Inhale 2 puffs into the lungs every 4 (four) hours as needed. Shortness of breath    Historical Provider, MD  diltiazem (CARDIZEM CD) 120 MG 24 hr capsule Take 1 capsule (120 mg total) by mouth daily. Patient not taking: Reported on 09/08/2014 08/01/14   Lendon Colonel, NP  edoxaban (SAVAYSA) 60 MG TABS tablet Take 60 mg by mouth daily. 06/26/14   Lendon Colonel, NP  nitroGLYCERIN (NITROSTAT) 0.4 MG SL tablet Place 1 tablet (0.4 mg total) under the tongue every 5 (five) minutes as needed for chest pain. 04/22/13   Lendon Colonel, NP   Physical Exam: Filed Vitals:   09/08/14 1200 09/08/14 1230 09/08/14 1300 09/08/14 1330  BP: 120/74 105/68 116/72   Pulse: 109 101 78   Temp:    98.4 F (36.9 C)  TempSrc:    Oral  Resp: 30 21 34   Height:      Weight:      SpO2: 97% 95% 91%     Wt Readings from Last 3 Encounters:  09/08/14 63.504 kg (140 lb)  08/01/14 62.596 kg (138 lb)  06/26/14 60.782 kg (134 lb)    General:  Appears calm and comfortable Eyes: PERRL, normal lids, irises & conjunctiva ENT: grossly normal hearing, because membranes of his mouth are pink slightly dry oropharynx without erythema or exudate Neck: no LAD, masses or thyromegaly Cardiovascular: RRR, no m/r/g. No LE edema. Respiratory: Mild increased work of breathing with conversation. Breath sounds are diminished throughout with faint expiratory wheeze no crackles able to complete full sentences Abdomen: soft, ntnd positive bowel sounds Skin: no rash  or induration seen on limited exam Musculoskeletal: grossly normal tone BUE/BLE Psychiatric: grossly normal mood and affect, speech fluent and appropriate Neurologic: grossly non-focal.          Labs on Admission:  Basic Metabolic Panel:  Recent Labs Lab 09/08/14 0942  NA 135  K 3.8  CL 103  CO2 25  GLUCOSE 178*  BUN 10  CREATININE 0.94  CALCIUM 8.2*   Liver Function Tests:  Recent Labs Lab 09/08/14 0942  AST 19  ALT 16  ALKPHOS 65  BILITOT 0.3  PROT 7.1  ALBUMIN 3.1*   No results for input(s): LIPASE, AMYLASE in the last 168 hours. No results for input(s): AMMONIA in the last 168 hours. CBC:  Recent Labs Lab 09/08/14 0942  WBC 7.7  NEUTROABS 5.1  HGB 13.7  HCT 41.7  MCV 81.0  PLT 251   Cardiac Enzymes: No results for input(s): CKTOTAL, CKMB, CKMBINDEX, TROPONINI in the last  168 hours.  BNP (last 3 results) No results for input(s): BNP in the last 8760 hours.  ProBNP (last 3 results)  Recent Labs  10/31/13 0152  PROBNP 359.3*    CBG: No results for input(s): GLUCAP in the last 168 hours.  Radiological Exams on Admission: Dg Chest 2 View  09/08/2014   CLINICAL DATA:  Hemoptysis and night sweats for 1 month  EXAM: CHEST  2 VIEW  COMPARISON:  July 15, 2014 and May 08, 2014  FINDINGS: There is underlying emphysema with fibrotic type change in the lung bases, more on the left than on the right. Scattered areas of scarring are present in both upper and mid lungs. There is patchy infiltrate currently in the left upper lobe near the apex. No other focal infiltrate is seen. The heart size is normal. The pulmonary vascularity reflects underlying emphysema. No adenopathy. No bone lesions.  IMPRESSION: Infiltrate in the apical segment of the left upper lobe. Underlying emphysema with areas of scarring and bibasilar interstitial fibrosis.   Electronically Signed   By: Lowella Grip III M.D.   On: 09/08/2014 10:13    EKG:  Assessment/Plan Principal  Problem:   Acute on chronic respiratory failure: Related to pneumonia in the setting of COPD. Wears oxygen continuously at home at 2 L he is requiring therapy liters to keep his sats above 90%. Will admit and provide steroids antibiotics and nebulizers. Will continue oxygen.  Active Problems:  CAP (community acquired pneumonia): Chart review indicates patient has been dealing with pneumonia off and on since December 2014. Will azithromycin and Rocephin initiated in the emergency department. Will obtain strep pneumo urine antigen as well as Legionella urine antigen. Early he is afebrile and nontoxic appearing    Hyperglycemia: Likely related to frequent need of steroids. Will monitor CBGs and provide sliding scale insulin as indicated    COPD (chronic obstructive pulmonary disease): He has nebulizers and inhalers at home. Not clear how compliant he is with his meds. See #1    ASCVD (arteriosclerotic cardiovascular disease): No chest pain.  History of A. Fib. EKG reveals sinus tachycardia initially. Patient then developed A. fib with RVR while in the emergency department. He was given Cardizem by mouth and bolus dose 10 mg. His heart rate remained between 120 and 140. Cardizem drip was initiated and admission orders changed to step down. He stopped his anticoagulation a days ago due to blood-tinged sputum and bloody nose. All check a PT/INR. Monitor     Code Status: full DVT Prophylaxis: Family Communication: none present Disposition Plan: home when ready  Time spent: 57 minutes  [facility] Pager [phone]

## 2014-09-09 LAB — GLUCOSE, CAPILLARY
GLUCOSE-CAPILLARY: 381 mg/dL — AB (ref 70–99)
GLUCOSE-CAPILLARY: 317 mg/dL — AB (ref 70–99)
GLUCOSE-CAPILLARY: 344 mg/dL — AB (ref 70–99)
GLUCOSE-CAPILLARY: 227 mg/dL — AB (ref 70–99)
GLUCOSE-CAPILLARY: 165 mg/dL — AB (ref 70–99)
Glucose-Capillary: 304 mg/dL — ABNORMAL HIGH (ref 70–99)
Glucose-Capillary: 198 mg/dL — ABNORMAL HIGH (ref 70–99)
Glucose-Capillary: 132 mg/dL — ABNORMAL HIGH (ref 70–99)

## 2014-09-09 LAB — BASIC METABOLIC PANEL
ANION GAP: 8 (ref 5–15)
BUN: 19 mg/dL (ref 6–23)
CO2: 23 mmol/L (ref 19–32)
Calcium: 8.2 mg/dL — ABNORMAL LOW (ref 8.4–10.5)
Chloride: 104 mmol/L (ref 96–112)
Creatinine, Ser: 1.32 mg/dL (ref 0.50–1.35)
GFR calc Af Amer: 69 mL/min — ABNORMAL LOW (ref >90)
GFR calc non Af Amer: 59 mL/min — ABNORMAL LOW (ref >90)
GLUCOSE: 239 mg/dL — AB (ref 70–99)
Potassium: 4.4 mmol/L (ref 3.5–5.1)
SODIUM: 135 mmol/L (ref 135–145)

## 2014-09-09 LAB — CBC
HEMATOCRIT: 35 % — AB (ref 39.0–52.0)
Hemoglobin: 11.2 g/dL — ABNORMAL LOW (ref 13.0–17.0)
MCH: 26.2 pg (ref 26.0–34.0)
MCHC: 32 g/dL (ref 30.0–36.0)
MCV: 81.8 fL (ref 78.0–100.0)
Platelets: 218 10*3/uL (ref 150–400)
RBC: 4.28 MIL/uL (ref 4.22–5.81)
RDW: 14.2 % (ref 11.5–15.5)
WBC: 10.3 10*3/uL (ref 4.0–10.5)

## 2014-09-09 LAB — HEMOGLOBIN A1C
HEMOGLOBIN A1C: 7 % — AB (ref 4.8–5.6)
MEAN PLASMA GLUCOSE: 154 mg/dL

## 2014-09-09 MED ORDER — LEVALBUTEROL HCL 0.63 MG/3ML IN NEBU
0.6300 mg | INHALATION_SOLUTION | Freq: Four times a day (QID) | RESPIRATORY_TRACT | Status: DC
  Administered 2014-09-09 – 2014-09-13 (×16): 0.63 mg via RESPIRATORY_TRACT
  Filled 2014-09-09 (×16): qty 3

## 2014-09-09 MED ORDER — GUAIFENESIN ER 600 MG PO TB12
1200.0000 mg | ORAL_TABLET | Freq: Two times a day (BID) | ORAL | Status: DC
  Administered 2014-09-09 – 2014-09-15 (×13): 1200 mg via ORAL
  Filled 2014-09-09 (×13): qty 2

## 2014-09-09 MED ORDER — LORAZEPAM 2 MG/ML IJ SOLN
0.5000 mg | Freq: Four times a day (QID) | INTRAMUSCULAR | Status: DC | PRN
  Administered 2014-09-10 – 2014-09-12 (×4): 0.5 mg via INTRAVENOUS
  Filled 2014-09-09 (×4): qty 1

## 2014-09-09 MED ORDER — OXYCODONE HCL 5 MG PO TABS
5.0000 mg | ORAL_TABLET | ORAL | Status: DC | PRN
  Administered 2014-09-09 – 2014-09-13 (×20): 5 mg via ORAL
  Filled 2014-09-09 (×20): qty 1

## 2014-09-09 MED ORDER — LEVALBUTEROL HCL 0.63 MG/3ML IN NEBU: 0.6300 mg | INHALATION_SOLUTION | RESPIRATORY_TRACT | Status: DC | PRN

## 2014-09-09 NOTE — Progress Notes (Signed)
[facility] PROGRESS NOTE  Ryan [name] [id number] DOB: 30-Dec-1960 DOA: 09/08/2014 PCP: Dena Billet BETH, PA-C  Assessment/Plan: Acute Chronic Hypoxemic Respiratory Failure -Related to CAP in the setting of COPD. -Continue oxygen as needed. -See below for details.  CAP -Continue rocephin/azithro. -Cx data remains negative to date. -Afebrile/leukocytosis improving.  COPD with Acute Exacerbation -Still with wheezing today. -Continue steroids and PRn nebs.  A Fib with RVR -Has chronic a fib. -Rapid rates likely related to ongoing pulmonary issues. -Continue cardizem drip and wean as tolerated. -He was on saveysa which he self-discontinued due to hemoptysis (has had none since admission). -Will consider restarting in am.  Code Status: Full Code Family Communication: Patient only  Disposition Plan: Home when ready. Keep in [place] until weaned off cardizem drip.   Consultants:  None   Antibiotics:  Rocephin  Azithro   Subjective: Feels better altho still SOB.  Objective: Filed Vitals:   09/09/14 1600 09/09/14 1700 09/09/14 1702 09/09/14 1800  BP: 113/66 112/68    Pulse: 73 76  97  Temp:   97.5 F (36.4 C)   TempSrc:   Oral   Resp: 20 21  39  Height:      Weight:      SpO2: 98% 100%  87%    Intake/Output Summary (Last 24 hours) at 09/09/14 1846 Last data filed at 09/09/14 1230  Gross per 24 hour  Intake 828.31 ml  Output    350 ml  Net 478.31 ml   Filed Weights   09/08/14 0918 09/09/14 0400  Weight: 63.504 kg (140 lb) 63 kg (138 lb 14.2 oz)    Exam:   General:  AA Ox3  Cardiovascular: irregular, not tachy  Respiratory: coarse BS and wheezing  Abdomen: S/NT/ND/+BA  Extremities: no C/C/E   Neurologic:  Intact/non-focal  Data Reviewed: Basic Metabolic Panel:  Recent Labs Lab 09/08/14 0942 09/09/14 0542  NA 135 135  K 3.8 4.4  CL 103 104  CO2 25 23  GLUCOSE 178* 239*  BUN 10 19  CREATININE 0.94 1.32  CALCIUM  8.2* 8.2*   Liver Function Tests:  Recent Labs Lab 09/08/14 0942  AST 19  ALT 16  ALKPHOS 65  BILITOT 0.3  PROT 7.1  ALBUMIN 3.1*   No results for input(s): LIPASE, AMYLASE in the last 168 hours. No results for input(s): AMMONIA in the last 168 hours. CBC:  Recent Labs Lab 09/08/14 0942 09/09/14 0542  WBC 7.7 10.3  NEUTROABS 5.1  --   HGB 13.7 11.2*  HCT 41.7 35.0*  MCV 81.0 81.8  PLT 251 218   Cardiac Enzymes: No results for input(s): CKTOTAL, CKMB, CKMBINDEX, TROPONINI in the last 168 hours. BNP (last 3 results) No results for input(s): BNP in the last 8760 hours.  ProBNP (last 3 results)  Recent Labs  10/31/13 0152  PROBNP 359.3*    CBG:  Recent Labs Lab 09/08/14 2011 09/08/14 2112 09/09/14 0812 09/09/14 1213 09/09/14 1644  GLUCAP 317* 304* 198* 227* 132*    Recent Results (from the past 240 hour(s))  Blood culture (routine x 2)     Status: None (Preliminary result)   Collection Time: 09/08/14 11:12 AM  Result Value Ref Range Status   Specimen Description RIGHT ANTECUBITAL  Final   Special Requests BOTTLES DRAWN AEROBIC AND ANAEROBIC 6CC  Final   Culture NO GROWTH 1 DAY  Final   Report Status PENDING  Incomplete  Blood culture (routine x 2)     Status:  None (Preliminary result)   Collection Time: 09/08/14 11:20 AM  Result Value Ref Range Status   Specimen Description BLOOD RIGHT HAND  Final   Special Requests BOTTLES DRAWN AEROBIC AND ANAEROBIC 6CC  Final   Culture NO GROWTH 1 DAY  Final   Report Status PENDING  Incomplete  MRSA PCR Screening     Status: None   Collection Time: 09/08/14  3:30 PM  Result Value Ref Range Status   MRSA by PCR NEGATIVE NEGATIVE Final    Comment:        The GeneXpert MRSA Assay (FDA approved for NASAL specimens only), is one component of a comprehensive MRSA colonization surveillance program. It is not intended to diagnose MRSA infection nor to guide or monitor treatment for MRSA infections.       Studies: Dg Chest 2 View  09/08/2014   CLINICAL DATA:  Hemoptysis and night sweats for 1 month  EXAM: CHEST  2 VIEW  COMPARISON:  July 15, 2014 and May 08, 2014  FINDINGS: There is underlying emphysema with fibrotic type change in the lung bases, more on the left than on the right. Scattered areas of scarring are present in both upper and mid lungs. There is patchy infiltrate currently in the left upper lobe near the apex. No other focal infiltrate is seen. The heart size is normal. The pulmonary vascularity reflects underlying emphysema. No adenopathy. No bone lesions.  IMPRESSION: Infiltrate in the apical segment of the left upper lobe. Underlying emphysema with areas of scarring and bibasilar interstitial fibrosis.   Electronically Signed   By: Lowella Grip III M.D.   On: 09/08/2014 10:13    Scheduled Meds: . azithromycin  500 mg Intravenous Q24H  . budesonide-formoterol  2 puff Inhalation BID  . cefTRIAXone (ROCEPHIN)  IV  1 g Intravenous Q24H  . diltiazem  180 mg Oral Daily  . enoxaparin (LOVENOX) injection  40 mg Subcutaneous Q24H  . fluticasone  2 spray Each Nare Daily  . guaiFENesin  1,200 mg Oral BID  . insulin aspart  0-15 Units Subcutaneous TID WC  . insulin aspart  0-5 Units Subcutaneous QHS  . levalbuterol  0.63 mg Nebulization Q6H  . methylPREDNISolone (SOLU-MEDROL) injection  60 mg Intravenous Q12H  . sodium chloride  3 mL Intravenous Q12H  . tiotropium  18 mcg Inhalation Daily   Continuous Infusions: . diltiazem (CARDIZEM) infusion 5 mg/hr (09/09/14 1200)    Principal Problem:   Acute on chronic respiratory failure Active Problems:   COPD (chronic obstructive pulmonary disease)   ASCVD (arteriosclerotic cardiovascular disease)   Glucose intolerance (pre-diabetes)   Atrial fibrillation with rapid ventricular response   CAP (community acquired pneumonia)   Hyperglycemia   A-fib    Time spent: 35 minutes. Greater than 50% of this time was spent in  direct contact with the patient coordinating care.    Lelon Frohlich  [facility] Pager [phone]  If 7PM-7AM, please contact night-coverage at [email or web address], password [facility] 09/09/2014, 6:46 PM  LOS: 1 day

## 2014-09-10 ENCOUNTER — Inpatient Hospital Stay (HOSPITAL_COMMUNITY): Payer: Medicare Other

## 2014-09-10 DIAGNOSIS — J42 Unspecified chronic bronchitis: Secondary | ICD-10-CM

## 2014-09-10 LAB — BLOOD GAS, ARTERIAL
Acid-Base Excess: 0.6 mmol/L (ref 0.0–2.0)
Bicarbonate: 24 mEq/L (ref 20.0–24.0)
Drawn by: 21310
FIO2: 0.8 %
O2 CONTENT: 40 L/min
O2 SAT: 83.6 %
PATIENT TEMPERATURE: 37
TCO2: 21.2 mmol/L (ref 0–100)
pCO2 arterial: 34.6 mmHg — ABNORMAL LOW (ref 35.0–45.0)
pH, Arterial: 7.456 — ABNORMAL HIGH (ref 7.350–7.450)
pO2, Arterial: 49.3 mmHg — ABNORMAL LOW (ref 80.0–100.0)

## 2014-09-10 LAB — CBC
HCT: 38.3 % — ABNORMAL LOW (ref 39.0–52.0)
Hemoglobin: 12.5 g/dL — ABNORMAL LOW (ref 13.0–17.0)
MCH: 26.8 pg (ref 26.0–34.0)
MCHC: 32.6 g/dL (ref 30.0–36.0)
MCV: 82 fL (ref 78.0–100.0)
Platelets: 263 10*3/uL (ref 150–400)
RBC: 4.67 MIL/uL (ref 4.22–5.81)
RDW: 14.4 % (ref 11.5–15.5)
WBC: 18.2 10*3/uL — ABNORMAL HIGH (ref 4.0–10.5)

## 2014-09-10 LAB — BASIC METABOLIC PANEL
Anion gap: 8 (ref 5–15)
BUN: 28 mg/dL — AB (ref 6–23)
CHLORIDE: 103 mmol/L (ref 96–112)
CO2: 24 mmol/L (ref 19–32)
CREATININE: 0.99 mg/dL (ref 0.50–1.35)
Calcium: 8.6 mg/dL (ref 8.4–10.5)
GFR calc Af Amer: >90 mL/min (ref >90)
Glucose, Bld: 142 mg/dL — ABNORMAL HIGH (ref 70–99)
Potassium: 5 mmol/L (ref 3.5–5.1)
Sodium: 135 mmol/L (ref 135–145)

## 2014-09-10 LAB — GLUCOSE, CAPILLARY
GLUCOSE-CAPILLARY: 165 mg/dL — AB (ref 70–99)
GLUCOSE-CAPILLARY: 276 mg/dL — AB (ref 70–99)

## 2014-09-10 MED ORDER — METHYLPREDNISOLONE SODIUM SUCC 125 MG IJ SOLR
60.0000 mg | Freq: Three times a day (TID) | INTRAMUSCULAR | Status: DC
  Administered 2014-09-10 – 2014-09-11 (×3): 60 mg via INTRAVENOUS
  Filled 2014-09-10 (×3): qty 2

## 2014-09-10 MED ORDER — NICOTINE 21 MG/24HR TD PT24
21.0000 mg | MEDICATED_PATCH | Freq: Every day | TRANSDERMAL | Status: DC
  Administered 2014-09-10 – 2014-09-15 (×6): 21 mg via TRANSDERMAL
  Filled 2014-09-10 (×7): qty 1

## 2014-09-10 MED ORDER — SODIUM CHLORIDE 0.9 % IV SOLN: Freq: Once | INTRAVENOUS | Status: DC

## 2014-09-10 NOTE — Progress Notes (Signed)
ABG resulted, paged PA, waiting for return call.

## 2014-09-10 NOTE — Progress Notes (Signed)
Patient was on a nonrebreather mask, RT placed on Heated High Flow nasal cannula 65% FIO2 and 30L flow. RN at bedside, patient appears to be tolerating well at this time. RT will continue to monitor.

## 2014-09-10 NOTE — Progress Notes (Signed)
Has been off cardizem drip since yesterday at 1300. Heart rate at that time was 78. Heart rate would increase to 105 when he would cough and get out of bed but quickly returned to low 80 rate. Was reported that last night patient was placed on non-rebreather mask because of some increased difficulty breathing.

## 2014-09-10 NOTE — Progress Notes (Signed)
Called Marval Regal PA-C to report patients SPO2 of 85-90%, having some increased work of breathing. Respiratory therapy increased heated hi flow to 80% and 40L, order for ABG and call results.

## 2014-09-10 NOTE — Progress Notes (Addendum)
[facility] PROGRESS NOTE  Ryan [name] BJY:[id number] DOB: 1961-03-25 DOA: 09/08/2014 PCP: Dena Billet BETH, PA-C  Assessment/Plan: Acute Chronic Hypoxemic Respiratory Failure -Related to CAP in the setting of COPD and what appears to be interstitial lung disease. -Continue oxygen as needed (now on heated high flow 65% FiO2 and 30 L flow). -ABG with pO2 of 35. -Not moving much air today on exam. -Will increase steroids and repeat CXR. -Will request pulmonary assistance. -See below for details.  CAP -Continue rocephin/azithro. -Cx data remains negative to date. -Afebrile/leukocytosis improving.  COPD with Acute Exacerbation -Still with wheezing today. -Continue steroids and PRn nebs.  A Fib with RVR -Has chronic a fib. -Rapid rates likely related to ongoing pulmonary issues. -Cardizem drip weaned off 2/27. -He was on saveysa which he self-discontinued due to hemoptysis (has had none since admission). -Will consider restarting in am.  Code Status: Full Code Family Communication: Patient only  Disposition Plan: Home when ready. Keep in ICU today given hypoxemia   Consultants:  None   Antibiotics:  Rocephin  Azithro   Subjective: Feels better altho still SOB.  Objective: Filed Vitals:   09/10/14 1400 09/10/14 1500 09/10/14 1600 09/10/14 1700  BP: 110/60 123/70    Pulse: 83 105 88 95  Temp:    97.8 F (36.6 C)  TempSrc:    Oral  Resp: 21 27 23 21   Height:      Weight:      SpO2: 94% 97% 94% 98%    Intake/Output Summary (Last 24 hours) at 09/10/14 1819 Last data filed at 09/10/14 1722  Gross per 24 hour  Intake    480 ml  Output   3105 ml  Net  -2625 ml   Filed Weights   09/08/14 0918 09/09/14 0400 09/10/14 0500  Weight: 63.504 kg (140 lb) 63 kg (138 lb 14.2 oz) 64 kg (141 lb 1.5 oz)    Exam:   General:  AA Ox3  Cardiovascular: irregular, not tachy  Respiratory: coarse BS and wheezing  Abdomen: S/NT/ND/+BA  Extremities: no  C/C/E   Neurologic:  Intact/non-focal  Data Reviewed: Basic Metabolic Panel:  Recent Labs Lab 09/08/14 0942 09/09/14 0542 09/10/14 0528  NA 135 135 135  K 3.8 4.4 5.0  CL 103 104 103  CO2 25 23 24   GLUCOSE 178* 239* 142*  BUN 10 19 28*  CREATININE 0.94 1.32 0.99  CALCIUM 8.2* 8.2* 8.6   Liver Function Tests:  Recent Labs Lab 09/08/14 0942  AST 19  ALT 16  ALKPHOS 65  BILITOT 0.3  PROT 7.1  ALBUMIN 3.1*   No results for input(s): LIPASE, AMYLASE in the last 168 hours. No results for input(s): AMMONIA in the last 168 hours. CBC:  Recent Labs Lab 09/08/14 0942 09/09/14 0542 09/10/14 0528  WBC 7.7 10.3 18.2*  NEUTROABS 5.1  --   --   HGB 13.7 11.2* 12.5*  HCT 41.7 35.0* 38.3*  MCV 81.0 81.8 82.0  PLT 251 218 263   Cardiac Enzymes: No results for input(s): CKTOTAL, CKMB, CKMBINDEX, TROPONINI in the last 168 hours. BNP (last 3 results) No results for input(s): BNP in the last 8760 hours.  ProBNP (last 3 results)  Recent Labs  10/31/13 0152  PROBNP 359.3*    CBG:  Recent Labs Lab 09/09/14 1213 09/09/14 1644 09/09/14 2128 09/10/14 0745 09/10/14 1152  GLUCAP 227* 132* 165* 165* 276*    Recent Results (from the past 240 hour(s))  Blood culture (routine x 2)  Status: None (Preliminary result)   Collection Time: 09/08/14 11:12 AM  Result Value Ref Range Status   Specimen Description RIGHT ANTECUBITAL  Final   Special Requests BOTTLES DRAWN AEROBIC AND ANAEROBIC 6CC  Final   Culture NO GROWTH 2 DAYS  Final   Report Status PENDING  Incomplete  Blood culture (routine x 2)     Status: None (Preliminary result)   Collection Time: 09/08/14 11:20 AM  Result Value Ref Range Status   Specimen Description BLOOD RIGHT HAND  Final   Special Requests BOTTLES DRAWN AEROBIC AND ANAEROBIC 6CC  Final   Culture NO GROWTH 2 DAYS  Final   Report Status PENDING  Incomplete  MRSA PCR Screening     Status: None   Collection Time: 09/08/14  3:30 PM  Result  Value Ref Range Status   MRSA by PCR NEGATIVE NEGATIVE Final    Comment:        The GeneXpert MRSA Assay (FDA approved for NASAL specimens only), is one component of a comprehensive MRSA colonization surveillance program. It is not intended to diagnose MRSA infection nor to guide or monitor treatment for MRSA infections.      Studies: Dg Chest 2 View  09/10/2014   CLINICAL DATA:  Shortness of breath  EXAM: CHEST  2 VIEW  COMPARISON:  09/08/2014  FINDINGS: Chronic interstitial markings with fibrosis in the left lung apex and bilateral lung bases.  Superimposed lingular/left lower lobe pneumonia is suspected.  No pleural effusion or pneumothorax.  The heart is normal in size.  IMPRESSION: Lingular/left lower lobe pneumonia.  Underlying chronic interstitial lung disease/fibrosis.   Electronically Signed   By: Julian Hy M.D.   On: 09/10/2014 12:00    Scheduled Meds: . sodium chloride   Intravenous Once  . azithromycin  500 mg Intravenous Q24H  . budesonide-formoterol  2 puff Inhalation BID  . cefTRIAXone (ROCEPHIN)  IV  1 g Intravenous Q24H  . diltiazem  180 mg Oral Daily  . enoxaparin (LOVENOX) injection  40 mg Subcutaneous Q24H  . fluticasone  2 spray Each Nare Daily  . guaiFENesin  1,200 mg Oral BID  . insulin aspart  0-15 Units Subcutaneous TID WC  . insulin aspart  0-5 Units Subcutaneous QHS  . levalbuterol  0.63 mg Nebulization Q6H  . methylPREDNISolone (SOLU-MEDROL) injection  60 mg Intravenous 3 times per day  . nicotine  21 mg Transdermal Daily  . sodium chloride  3 mL Intravenous Q12H  . tiotropium  18 mcg Inhalation Daily   Continuous Infusions:    Principal Problem:   Acute on chronic respiratory failure Active Problems:   COPD (chronic obstructive pulmonary disease)   ASCVD (arteriosclerotic cardiovascular disease)   Glucose intolerance (pre-diabetes)   Atrial fibrillation with rapid ventricular response   CAP (community acquired pneumonia)    Hyperglycemia   A-fib    Time spent: 35 minutes. Greater than 50% of this time was spent in direct contact with the patient coordinating care.    Lelon Frohlich  [facility] Pager [phone]  If 7PM-7AM, please contact night-coverage at [email or web address], password [facility] 09/10/2014, 6:19 PM  LOS: 2 days

## 2014-09-10 NOTE — Progress Notes (Signed)
Spoke to Dr. Maudie Mercury, reported ABG results and patients current condition, no longer having labored breathing, remains on heated hi flow of 80% 40L. Will continue to monitor for now.

## 2014-09-11 LAB — CBC
HCT: 38.7 % — ABNORMAL LOW (ref 39.0–52.0)
Hemoglobin: 12.5 g/dL — ABNORMAL LOW (ref 13.0–17.0)
MCH: 26.3 pg (ref 26.0–34.0)
MCHC: 32.3 g/dL (ref 30.0–36.0)
MCV: 81.5 fL (ref 78.0–100.0)
Platelets: 247 10*3/uL (ref 150–400)
RBC: 4.75 MIL/uL (ref 4.22–5.81)
RDW: 14.3 % (ref 11.5–15.5)
WBC: 10 10*3/uL (ref 4.0–10.5)

## 2014-09-11 LAB — GLUCOSE, CAPILLARY
GLUCOSE-CAPILLARY: 121 mg/dL — AB (ref 70–99)
GLUCOSE-CAPILLARY: 268 mg/dL — AB (ref 70–99)
Glucose-Capillary: 184 mg/dL — ABNORMAL HIGH (ref 70–99)
Glucose-Capillary: 208 mg/dL — ABNORMAL HIGH (ref 70–99)

## 2014-09-11 LAB — LEGIONELLA ANTIGEN, URINE

## 2014-09-11 MED ORDER — AZITHROMYCIN 250 MG PO TABS
500.0000 mg | ORAL_TABLET | Freq: Every day | ORAL | Status: DC
  Administered 2014-09-11 – 2014-09-15 (×5): 500 mg via ORAL
  Filled 2014-09-11 (×5): qty 2

## 2014-09-11 MED ORDER — BENZONATATE 100 MG PO CAPS
100.0000 mg | ORAL_CAPSULE | Freq: Three times a day (TID) | ORAL | Status: DC | PRN
  Administered 2014-09-11 – 2014-09-14 (×7): 100 mg via ORAL
  Filled 2014-09-11 (×7): qty 1

## 2014-09-11 MED ORDER — PANTOPRAZOLE SODIUM 40 MG PO TBEC
40.0000 mg | DELAYED_RELEASE_TABLET | Freq: Every day | ORAL | Status: DC
  Administered 2014-09-11 – 2014-09-15 (×5): 40 mg via ORAL
  Filled 2014-09-11 (×5): qty 1

## 2014-09-11 MED ORDER — BUDESONIDE-FORMOTEROL FUMARATE 160-4.5 MCG/ACT IN AERO
2.0000 | INHALATION_SPRAY | Freq: Two times a day (BID) | RESPIRATORY_TRACT | Status: DC
  Administered 2014-09-11 – 2014-09-15 (×8): 2 via RESPIRATORY_TRACT
  Filled 2014-09-11: qty 6

## 2014-09-11 MED ORDER — METHYLPREDNISOLONE SODIUM SUCC 125 MG IJ SOLR
60.0000 mg | Freq: Two times a day (BID) | INTRAMUSCULAR | Status: DC
  Administered 2014-09-11 – 2014-09-13 (×4): 60 mg via INTRAVENOUS
  Filled 2014-09-11 (×4): qty 2

## 2014-09-11 NOTE — Progress Notes (Signed)
[facility] PROGRESS NOTE  Ryan [name] Perlman OZD:[id number] DOB: Jun 26, 1961 DOA: 09/08/2014 PCP: Dena Billet BETH, PA-C  Assessment/Plan: Acute Chronic Hypoxemic Respiratory Failure -Related to CAP in the setting of COPD and what appears to be interstitial lung disease. -Continue oxygen as needed (now on heated high flow 65% FiO2 and 30 L flow). Try and titrate to [place] today. -Will start titrating steroids today. -Appreciate pulmonary assistance. -Has a component of anxiety it seems. -See below for details.  CAP -Continue rocephin/azithro. -Cx data remains negative to date. -Afebrile/leukocytosis improving.  COPD with Acute Exacerbation -Still with wheezing today. -Continue steroids and PRN nebs.  A Fib with RVR -Has chronic a fib. -Rapid rates likely related to ongoing pulmonary issues. -Cardizem drip weaned off 2/27. -He was on saveysa which he self-discontinued due to hemoptysis (has had none since admission). -Will restart.  Code Status: Full Code Family Communication: Patient only  Disposition Plan: Home when ready. Keep in ICU today given hypoxemia and while we try to wean high flow oxygen.   Consultants:  Pulmonary, Dr. Luan Pulling   Antibiotics:  Rocephin  Azithro   Subjective: Feels better altho still SOB. Slept well last pm with ambien.  Objective: Filed Vitals:   09/11/14 0700 09/11/14 0702 09/11/14 0800 09/11/14 0900  BP: 117/68  128/71 109/94  Pulse: 70  74 88  Temp:   97.5 F (36.4 C)   TempSrc:   Axillary   Resp: 22  19 12   Height:      Weight:      SpO2: 95% 92% 95% 97%    Intake/Output Summary (Last 24 hours) at 09/11/14 0912 Last data filed at 09/11/14 0809  Gross per 24 hour  Intake    960 ml  Output   3005 ml  Net  -2045 ml   Filed Weights   09/09/14 0400 09/10/14 0500 09/11/14 0500  Weight: 63 kg (138 lb 14.2 oz) 64 kg (141 lb 1.5 oz) 65 kg (143 lb 4.8 oz)    Exam:   General:  AA Ox3  Cardiovascular: irregular, not  tachy  Respiratory: coarse BS no wheezing.  Abdomen: S/NT/ND/+BA  Extremities: no C/C/E   Neurologic:  Intact/non-focal  Data Reviewed: Basic Metabolic Panel:  Recent Labs Lab 09/08/14 0942 09/09/14 0542 09/10/14 0528  NA 135 135 135  K 3.8 4.4 5.0  CL 103 104 103  CO2 25 23 24   GLUCOSE 178* 239* 142*  BUN 10 19 28*  CREATININE 0.94 1.32 0.99  CALCIUM 8.2* 8.2* 8.6   Liver Function Tests:  Recent Labs Lab 09/08/14 0942  AST 19  ALT 16  ALKPHOS 65  BILITOT 0.3  PROT 7.1  ALBUMIN 3.1*   No results for input(s): LIPASE, AMYLASE in the last 168 hours. No results for input(s): AMMONIA in the last 168 hours. CBC:  Recent Labs Lab 09/08/14 0942 09/09/14 0542 09/10/14 0528 09/11/14 0431  WBC 7.7 10.3 18.2* 10.0  NEUTROABS 5.1  --   --   --   HGB 13.7 11.2* 12.5* 12.5*  HCT 41.7 35.0* 38.3* 38.7*  MCV 81.0 81.8 82.0 81.5  PLT 251 218 263 247   Cardiac Enzymes: No results for input(s): CKTOTAL, CKMB, CKMBINDEX, TROPONINI in the last 168 hours. BNP (last 3 results) No results for input(s): BNP in the last 8760 hours.  ProBNP (last 3 results)  Recent Labs  10/31/13 0152  PROBNP 359.3*    CBG:  Recent Labs Lab 09/09/14 2128 09/10/14 0745 09/10/14 1152 09/10/14 1659  09/11/14 0724  GLUCAP 165* 165* 276* 121* 184*    Recent Results (from the past 240 hour(s))  Blood culture (routine x 2)     Status: None (Preliminary result)   Collection Time: 09/08/14 11:12 AM  Result Value Ref Range Status   Specimen Description RIGHT ANTECUBITAL  Final   Special Requests BOTTLES DRAWN AEROBIC AND ANAEROBIC 6CC  Final   Culture NO GROWTH 2 DAYS  Final   Report Status PENDING  Incomplete  Blood culture (routine x 2)     Status: None (Preliminary result)   Collection Time: 09/08/14 11:20 AM  Result Value Ref Range Status   Specimen Description BLOOD RIGHT HAND  Final   Special Requests BOTTLES DRAWN AEROBIC AND ANAEROBIC 6CC  Final   Culture NO GROWTH 2  DAYS  Final   Report Status PENDING  Incomplete  MRSA PCR Screening     Status: None   Collection Time: 09/08/14  3:30 PM  Result Value Ref Range Status   MRSA by PCR NEGATIVE NEGATIVE Final    Comment:        The GeneXpert MRSA Assay (FDA approved for NASAL specimens only), is one component of a comprehensive MRSA colonization surveillance program. It is not intended to diagnose MRSA infection nor to guide or monitor treatment for MRSA infections.      Studies: Dg Chest 2 View  09/10/2014   CLINICAL DATA:  Shortness of breath  EXAM: CHEST  2 VIEW  COMPARISON:  09/08/2014  FINDINGS: Chronic interstitial markings with fibrosis in the left lung apex and bilateral lung bases.  Superimposed lingular/left lower lobe pneumonia is suspected.  No pleural effusion or pneumothorax.  The heart is normal in size.  IMPRESSION: Lingular/left lower lobe pneumonia.  Underlying chronic interstitial lung disease/fibrosis.   Electronically Signed   By: Julian Hy M.D.   On: 09/10/2014 12:00    Scheduled Meds: . sodium chloride   Intravenous Once  . azithromycin  500 mg Oral Daily  . budesonide-formoterol  2 puff Inhalation BID  . cefTRIAXone (ROCEPHIN)  IV  1 g Intravenous Q24H  . diltiazem  180 mg Oral Daily  . enoxaparin (LOVENOX) injection  40 mg Subcutaneous Q24H  . fluticasone  2 spray Each Nare Daily  . guaiFENesin  1,200 mg Oral BID  . insulin aspart  0-15 Units Subcutaneous TID WC  . insulin aspart  0-5 Units Subcutaneous QHS  . levalbuterol  0.63 mg Nebulization Q6H  . methylPREDNISolone (SOLU-MEDROL) injection  60 mg Intravenous 3 times per day  . nicotine  21 mg Transdermal Daily  . pantoprazole  40 mg Oral Q1200  . sodium chloride  3 mL Intravenous Q12H  . tiotropium  18 mcg Inhalation Daily   Continuous Infusions:    Principal Problem:   Acute on chronic respiratory failure Active Problems:   COPD (chronic obstructive pulmonary disease)   ASCVD (arteriosclerotic  cardiovascular disease)   Glucose intolerance (pre-diabetes)   Atrial fibrillation with rapid ventricular response   CAP (community acquired pneumonia)   Hyperglycemia   A-fib    Time spent: 35 minutes. Greater than 50% of this time was spent in direct contact with the patient coordinating care.    Lelon Frohlich  [facility] Pager [phone]  If 7PM-7AM, please contact night-coverage at [email or web address], password [facility] 09/11/2014, 9:12 AM  LOS: 3 days

## 2014-09-11 NOTE — Care Management Note (Addendum)
    Page 1 of 2   09/15/2014     1:29:38 PM CARE MANAGEMENT NOTE 09/15/2014  Patient:  Ryan [name], Ryan [name]   Account Number:  [id number]  Date Initiated:  09/11/2014  Documentation initiated by:  Jolene Provost  Subjective/Objective Assessment:   Pt admitted with resp failure. Pt is from home, lives with wife and requires assistance bathing. Pt has home O2 through [employer]. Pt has a neb machine. Pt has no HH services or other DME's.     Action/Plan:   Pt plans to discharge home with self care. PT eval pending. Will cont to follow for CM needs.   Anticipated DC Date:  09/13/2014   Anticipated DC Plan:  [place]  CM consult      [facility] Choice  HOME HEALTH   Choice offered to / List presented to:  C-1 Patient        [place] arranged  HH-1 RN  [place].   Status of service:  Completed, signed off Medicare Important Message given?  YES (If response is "NO", the following Medicare IM given date fields will be blank) Date Medicare IM given:  09/15/2014 Medicare IM given by:  Theophilus Kinds Date Additional Medicare IM given:   Additional Medicare IM given by:    Discharge Disposition:  [place]  Per UR Regulation:  Reviewed for med. necessity/level of care/duration of stay  If discussed at [place] of Stay Meetings, dates discussed:   09/14/2014    Comments:  09/15/14 [place], RN BSN CM Pt discharged home today with [facility] RN (per pts choice). Romualdo Bolk of [facility] is aware and will collect the pts information from the chart. [place] services to start within 48 hours of discharge. No DME needs noted. Pt and pts nurse aware of discharge arrangements.  09/14/14 [place], RN BSN CM Pt agreed to [place] with [place] (per pts choice). Romualdo Bolk of [facility] is aware and will collect the pts information from the chart. Hh services to start within 48 hours of discharge.  Pt has home O2 and neb machine in place. Anticipate discharge within 48 hours. No DME needs noted. Pt and pts nurse aware of discharge arrangements.  09/11/2014 [place], RN, MSN, CM

## 2014-09-11 NOTE — Progress Notes (Signed)
ANTIBIOTIC CONSULT NOTE - follow up  Pharmacy Consult for Rocephin Indication: CAP  Allergies  Allergen Reactions  . Influenza Vaccine Live Swelling   Patient Measurements: Height: 5\' 7"  (170.2 cm) Weight: 143 lb 4.8 oz (65 kg) IBW/kg (Calculated) : 66.1  Vital Signs: Temp: 97.5 F (36.4 C) (02/29 0800) Temp Source: Axillary (02/29 0800) BP: 128/71 mmHg (02/29 0800) Pulse Rate: 74 (02/29 0800) Intake/Output from previous day: 02/28 0701 - 02/29 0700 In: 960 [P.O.:960] Out: 3455 [Urine:3455] Intake/Output from this shift: Total I/O In: -  Out: 225 [Urine:225]  Labs:  Recent Labs  09/08/14 0942 09/09/14 0542 09/10/14 0528 09/11/14 0431  WBC 7.7 10.3 18.2* 10.0  HGB 13.7 11.2* 12.5* 12.5*  PLT 251 218 263 247  CREATININE 0.94 1.32 0.99  --    Estimated Creatinine Clearance: 77.5 mL/min (by C-G formula based on Cr of 0.99). No results for input(s): VANCOTROUGH, VANCOPEAK, VANCORANDOM, GENTTROUGH, GENTPEAK, GENTRANDOM, TOBRATROUGH, TOBRAPEAK, TOBRARND, AMIKACINPEAK, AMIKACINTROU, AMIKACIN in the last 72 hours.   Microbiology: Recent Results (from the past 720 hour(s))  Blood culture (routine x 2)     Status: None (Preliminary result)   Collection Time: 09/08/14 11:12 AM  Result Value Ref Range Status   Specimen Description RIGHT ANTECUBITAL  Final   Special Requests BOTTLES DRAWN AEROBIC AND ANAEROBIC 6CC  Final   Culture NO GROWTH 2 DAYS  Final   Report Status PENDING  Incomplete  Blood culture (routine x 2)     Status: None (Preliminary result)   Collection Time: 09/08/14 11:20 AM  Result Value Ref Range Status   Specimen Description BLOOD RIGHT HAND  Final   Special Requests BOTTLES DRAWN AEROBIC AND ANAEROBIC 6CC  Final   Culture NO GROWTH 2 DAYS  Final   Report Status PENDING  Incomplete  MRSA PCR Screening     Status: None   Collection Time: 09/08/14  3:30 PM  Result Value Ref Range Status   MRSA by PCR NEGATIVE NEGATIVE Final    Comment:        The  GeneXpert MRSA Assay (FDA approved for NASAL specimens only), is one component of a comprehensive MRSA colonization surveillance program. It is not intended to diagnose MRSA infection nor to guide or monitor treatment for MRSA infections.    Medical History: Past Medical History  Diagnosis Date  . COPD (chronic obstructive pulmonary disease)   . Hypercholesteremia   . History of pneumonia   . Polycythemia   . Coronary atherosclerosis of native coronary artery     Mild nonobstructive disease at catheterization 2007  . 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 Borderline diabetes   . Seizures     Last seizure 2 yrs ago  . GERD (gastroesophageal reflux disease)   . Type 2 diabetes mellitus   . DDD (degenerative disc disease)     Cervical and thoracic  . Chronic back pain   . Bronchitis   . Chronic left shoulder pain   . Chronic neck pain   . Atrial fibrillation     Not anticoagulated  . Smoker    Anti-infectives    Start     Dose/Rate Route Frequency Ordered Stop   09/11/14 1000  azithromycin (ZITHROMAX) tablet 500 mg     500 mg Oral Daily 09/11/14 0901 09/17/14 0959   09/09/14 1100  cefTRIAXone (ROCEPHIN) 1 g in dextrose 5 % 50 mL IVPB - Premix     1 g 100  mL/hr over 30 Minutes Intravenous Every 24 hours 09/08/14 1623     09/09/14 1000  azithromycin (ZITHROMAX) 500 mg in dextrose 5 % 250 mL IVPB  Status:  Discontinued     500 mg 250 mL/hr over 60 Minutes Intravenous Every 24 hours 09/08/14 1623 09/11/14 0901   09/08/14 1800  cefTRIAXone (ROCEPHIN) 1 g in dextrose 5 % 50 mL IVPB - Premix  Status:  Discontinued     1 g 100 mL/hr over 30 Minutes Intravenous Every 24 hours 09/08/14 1620 09/08/14 1623   09/08/14 1730  azithromycin (ZITHROMAX) 500 mg in dextrose 5 % 250 mL IVPB  Status:  Discontinued     500 mg 250 mL/hr over 60 Minutes Intravenous Every 24 hours 09/08/14 1610 09/08/14 1623   09/08/14 1100  cefTRIAXone (ROCEPHIN) 1 g in  dextrose 5 % 50 mL IVPB     1 g 100 mL/hr over 30 Minutes Intravenous  Once 09/08/14 1053 09/08/14 1341   09/08/14 1100  azithromycin (ZITHROMAX) 500 mg in dextrose 5 % 250 mL IVPB     500 mg 250 mL/hr over 60 Minutes Intravenous  Once 09/08/14 1053 09/08/14 1257     Assessment: 54yo male with good renal fxn.  Pt started on Zithromax and Rocephin for CAP.  Currently afebrile and tolerating oral medications.  Goal of Therapy:  Eradicate infection.  Plan:  Rocephin 1gm IV q24hrs Monitor labs and progress  PHARMACIST - PHYSICIAN COMMUNICATION DR:   Shonna Chock CONCERNING: Antibiotic IV to Oral Route Change Policy  RECOMMENDATION: This patient is receiving Zithromax by the intravenous route.  Based on criteria approved by the Pharmacy and Therapeutics Committee, the antibiotic(s) is/are being converted to the equivalent oral dose form(s).  DESCRIPTION: These criteria include:  Patient being treated for a respiratory tract infection, urinary tract infection, cellulitis or clostridium difficile associated diarrhea if on metronidazole  The patient is not neutropenic and does not exhibit a GI malabsorption state  The patient is eating (either orally or via tube) and/or has been taking other orally administered medications for a least 24 hours  The patient is improving clinically and has a Tmax < 100.5  If you have questions about this conversion, please contact the Pharmacy Department  [x]   [phone] )  [facility] []   [phone] )  [facility]  []   [phone] )  [facility] []   [phone] )  [facility], Mykaila Blunck A 09/11/2014,9:01 AM

## 2014-09-11 NOTE — Consult Note (Signed)
Consult requested by: [facility] Consult requested for respiratory failure:  HPI: This is a 54 year old who has multiple medical problems including COPD which is at least moderately severe. He had trouble with insurance previously and has not had pulmonary function testing in at least the last several years that I can tell. 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 He has bronchiectasis. He has some fibrotic changes on CT. He was admitted with pneumonia and has had increasing shortness of breath. He has been on high flow oxygen. He says he was able to sleep last night and feels better. He is coughing up sputum mixed with blood which is a chronic problem.  Past Medical History  Diagnosis Date  . COPD (chronic obstructive pulmonary disease)   . Hypercholesteremia   . History of pneumonia   . Polycythemia   . Coronary atherosclerosis of native coronary artery     Mild nonobstructive disease at catheterization 2007  . 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 Borderline diabetes   . Seizures     Last seizure 2 yrs ago  . GERD (gastroesophageal reflux disease)   . Type 2 diabetes mellitus   . DDD (degenerative disc disease)     Cervical and thoracic  . Chronic back pain   . Bronchitis   . Chronic left shoulder pain   . Chronic neck pain   . Atrial fibrillation     Not anticoagulated  . Smoker      Family History  Problem Relation Age of Onset  . Hypertension Mother   . Diabetes Mother   . Heart attack Mother   . Heart failure Mother   . Hypertension Sister   . Diabetes Sister   . Heart failure Sister      History   Social History  . Marital Status: Married    Spouse Name: [name]  . Number of Children: 4  . Years of Education: 10   Social History Main Topics  . Smoking status: Current Every Day Smoker -- 0.50 packs/day for 45 years    Types: Cigarettes    Start date: 12/07/1965  .  Smokeless tobacco: Never Used     Comment: started smoking half pack 2 days ago per stopped in the hospital as of 07-20-2013  . Alcohol Use: No  . Drug Use: No  . Sexual Activity: Yes    Birth Control/ Protection: Surgical   Other Topics Concern  . None   Social History Narrative   Patient is married [name]) and lives at home with his wife and one child.   Patient has four children.   Patient is disabled.   Patient has a high school education.   Patient is left-handed.   Patient does not drink any caffeine.     ROS: He says he is not having any chest pain. He has been coughing up blood. No swelling. He's been very anxious. He has had severe heartburn. He says he had an EGD done in [place] and was told that he needed lifetime PPI but has not been on that recently    Objective: Vital signs in last 24 hours: Temp:  [97.4 F (36.3 C)-97.8 F (36.6 C)] 97.8 F (36.6 C) (02/29 0400) Pulse Rate:  [59-105] 70 (02/29 0700) Resp:  [15-31] 22 (02/29 0700) BP: (107-134)/(60-92) 117/68 mmHg (02/29 0700) SpO2:  [85 %-100 %] 92 % (02/29 0702) FiO2 (%):  [  65 %-80 %] 80 % (02/29 0702) Weight:  [65 kg (143 lb 4.8 oz)] 65 kg (143 lb 4.8 oz) (02/29 0500) Weight change: 1 kg (2 lb 3.3 oz) Last BM Date: 09/09/14  Intake/Output from previous day: 02/28 0701 - 02/29 0700 In: 960 [P.O.:960] Out: 3455 [Urine:3455]  PHYSICAL EXAM He is awake and alert coughing frequently during the examination. He looks fairly comfortable. His pupils are reactive nose and throat are clear mucous membranes are moist his neck is supple without masses. His chest shows rhonchi bilaterally no wheezing. He coughs up blood mixed with sputum multiple times during the examination. His heart shows atrial fibrillation with a pretty well-controlled ventricular response. His abdomen is soft without masses. He doesn't have any edema. Central nervous system exam shows he still seems anxious but otherwise intact  Lab  Results: Basic Metabolic Panel:  Recent Labs  09/09/14 0542 09/10/14 0528  NA 135 135  K 4.4 5.0  CL 104 103  CO2 23 24  GLUCOSE 239* 142*  BUN 19 28*  CREATININE 1.32 0.99  CALCIUM 8.2* 8.6   Liver Function Tests:  Recent Labs  09/08/14 0942  AST 19  ALT 16  ALKPHOS 65  BILITOT 0.3  PROT 7.1  ALBUMIN 3.1*   No results for input(s): LIPASE, AMYLASE in the last 72 hours. No results for input(s): AMMONIA in the last 72 hours. CBC:  Recent Labs  09/08/14 0942  09/10/14 0528 09/11/14 0431  WBC 7.7  < > 18.2* 10.0  NEUTROABS 5.1  --   --   --   HGB 13.7  < > 12.5* 12.5*  HCT 41.7  < > 38.3* 38.7*  MCV 81.0  < > 82.0 81.5  PLT 251  < > 263 247  < > = values in this interval not displayed. Cardiac Enzymes: No results for input(s): CKTOTAL, CKMB, CKMBINDEX, TROPONINI in the last 72 hours. BNP: No results for input(s): PROBNP in the last 72 hours. D-Dimer: No results for input(s): DDIMER in the last 72 hours. CBG:  Recent Labs  09/09/14 1644 09/09/14 2128 09/10/14 0745 09/10/14 1152 09/10/14 1659 09/11/14 0724  GLUCAP 132* 165* 165* 276* 121* 184*   Hemoglobin A1C:  Recent Labs  09/08/14 0942  HGBA1C 7.0*   Fasting Lipid Panel: No results for input(s): CHOL, HDL, LDLCALC, TRIG, CHOLHDL, LDLDIRECT in the last 72 hours. Thyroid Function Tests: No results for input(s): TSH, T4TOTAL, FREET4, T3FREE, THYROIDAB in the last 72 hours. Anemia Panel: No results for input(s): VITAMINB12, FOLATE, FERRITIN, TIBC, IRON, RETICCTPCT in the last 72 hours. Coagulation:  Recent Labs  09/08/14 0942  LABPROT 13.4  INR 1.01   Urine Drug Screen: Drugs of Abuse     Component Value Date/Time   LABOPIA NONE DETECTED 10/20/2013 1345   COCAINSCRNUR NONE DETECTED 10/20/2013 1345   LABBENZ NONE DETECTED 10/20/2013 1345   AMPHETMU NONE DETECTED 10/20/2013 1345   THCU POSITIVE* 10/20/2013 1345   LABBARB NONE DETECTED 10/20/2013 1345    Alcohol Level: No results  for input(s): ETH in the last 72 hours. Urinalysis:  Recent Labs  09/08/14 1444  COLORURINE YELLOW  LABSPEC 1.020  PHURINE 5.5  GLUCOSEU 500*  HGBUR NEGATIVE  BILIRUBINUR NEGATIVE  KETONESUR NEGATIVE  PROTEINUR NEGATIVE  UROBILINOGEN 0.2  NITRITE NEGATIVE  LEUKOCYTESUR NEGATIVE   Misc. Labs:   ABGS:  Recent Labs  09/10/14 2039  PHART 7.456*  PO2ART 49.3*  TCO2 21.2  HCO3 24.0     MICROBIOLOGY: Recent Results (from  the past 240 hour(s))  Blood culture (routine x 2)     Status: None (Preliminary result)   Collection Time: 09/08/14 11:12 AM  Result Value Ref Range Status   Specimen Description RIGHT ANTECUBITAL  Final   Special Requests BOTTLES DRAWN AEROBIC AND ANAEROBIC 6CC  Final   Culture NO GROWTH 2 DAYS  Final   Report Status PENDING  Incomplete  Blood culture (routine x 2)     Status: None (Preliminary result)   Collection Time: 09/08/14 11:20 AM  Result Value Ref Range Status   Specimen Description BLOOD RIGHT HAND  Final   Special Requests BOTTLES DRAWN AEROBIC AND ANAEROBIC 6CC  Final   Culture NO GROWTH 2 DAYS  Final   Report Status PENDING  Incomplete  MRSA PCR Screening     Status: None   Collection Time: 09/08/14  3:30 PM  Result Value Ref Range Status   MRSA by PCR NEGATIVE NEGATIVE Final    Comment:        The GeneXpert MRSA Assay (FDA approved for NASAL specimens only), is one component of a comprehensive MRSA colonization surveillance program. It is not intended to diagnose MRSA infection nor to guide or monitor treatment for MRSA infections.     Studies/Results: Dg Chest 2 View  09/10/2014   CLINICAL DATA:  Shortness of breath  EXAM: CHEST  2 VIEW  COMPARISON:  09/08/2014  FINDINGS: Chronic interstitial markings with fibrosis in the left lung apex and bilateral lung bases.  Superimposed lingular/left lower lobe pneumonia is suspected.  No pleural effusion or pneumothorax.  The heart is normal in size.  IMPRESSION: Lingular/left lower  lobe pneumonia.  Underlying chronic interstitial lung disease/fibrosis.   Electronically Signed   By: Julian Hy M.D.   On: 09/10/2014 12:00    Medications:  Prior to Admission:  Prescriptions prior to admission  Medication Sig Dispense Refill Last Dose  . albuterol (PROVENTIL) (2.5 MG/3ML) 0.083% nebulizer solution Take 2.5 mg by nebulization every 6 (six) hours as needed for wheezing or shortness of breath.   09/07/2014 at Unknown time  . budesonide-formoterol (SYMBICORT) 80-4.5 MCG/ACT inhaler Inhale 2 puffs into the lungs 2 (two) times daily. 1 Inhaler 12 09/07/2014 at Unknown time  . diltiazem (CARDIZEM CD) 180 MG 24 hr capsule Take 180 mg by mouth daily.  0 09/07/2014 at Unknown time  . fluticasone (FLONASE) 50 MCG/ACT nasal spray Place 2 sprays into both nostrils daily.   09/07/2014 at Unknown time  . Fluticasone-Salmeterol (ADVAIR DISKUS) 250-50 MCG/DOSE AEPB Inhale 1 puff into the lungs every 12 (twelve) hours.    09/07/2014 at Unknown time  . tiotropium (SPIRIVA) 18 MCG inhalation capsule Place 18 mcg into inhaler and inhale daily.   09/07/2014 at Unknown time  . albuterol (VENTOLIN HFA) 108 (90 BASE) MCG/ACT inhaler Inhale 2 puffs into the lungs every 4 (four) hours as needed. Shortness of breath   unknown  . diltiazem (CARDIZEM CD) 120 MG 24 hr capsule Take 1 capsule (120 mg total) by mouth daily. (Patient not taking: Reported on 09/08/2014) 90 capsule 3   . edoxaban (SAVAYSA) 60 MG TABS tablet Take 60 mg by mouth daily. 30 tablet 3 8 days  . nitroGLYCERIN (NITROSTAT) 0.4 MG SL tablet Place 1 tablet (0.4 mg total) under the tongue every 5 (five) minutes as needed for chest pain. 25 tablet 4 unknown   Scheduled: . sodium chloride   Intravenous Once  . azithromycin  500 mg Intravenous Q24H  . budesonide-formoterol  2 puff Inhalation BID  . cefTRIAXone (ROCEPHIN)  IV  1 g Intravenous Q24H  . diltiazem  180 mg Oral Daily  . enoxaparin (LOVENOX) injection  40 mg Subcutaneous Q24H  .  fluticasone  2 spray Each Nare Daily  . guaiFENesin  1,200 mg Oral BID  . insulin aspart  0-15 Units Subcutaneous TID WC  . insulin aspart  0-5 Units Subcutaneous QHS  . levalbuterol  0.63 mg Nebulization Q6H  . methylPREDNISolone (SOLU-MEDROL) injection  60 mg Intravenous 3 times per day  . nicotine  21 mg Transdermal Daily  . pantoprazole  40 mg Oral Q1200  . sodium chloride  3 mL Intravenous Q12H  . tiotropium  18 mcg Inhalation Daily   Continuous:  VQW:QVLDKC chloride, acetaminophen **OR** acetaminophen, alum & mag hydroxide-simeth, levalbuterol, LORazepam, morphine injection, nitroGLYCERIN, ondansetron **OR** ondansetron (ZOFRAN) IV, oxyCODONE, senna-docusate, sodium chloride, traZODone  Assesment: He has community-acquired pneumonia. Because of this he has acute on chronic respiratory failure. He's has  COPD at baseline. He has chronic hemoptysis and chronic bronchiectasis which I think produces the hemoptysis. There may be some element of pulmonary fibrosis at least based on his CT it does not appear to be progressive this may be scar tissue. He has atrial fibrillation with rapid ventricular response and that is being treated. He's been very anxious and seems better on his current Ativan and says he slept better last night and feels much better. He has what is apparently GERD and I've restarted PPI Principal Problem:   Acute on chronic respiratory failure Active Problems:   COPD (chronic obstructive pulmonary disease)   ASCVD (arteriosclerotic cardiovascular disease)   Glucose intolerance (pre-diabetes)   Atrial fibrillation with rapid ventricular response   CAP (community acquired pneumonia)   Hyperglycemia   A-fib    Plan: He is on appropriate treatment. He does seem to be improving now. I would continue steroids antibiotics etc. I added PPI. I added flutter valve.    LOS: 3 days   Brendaliz Kuk L 09/11/2014, 8:05 AM

## 2014-09-11 NOTE — Progress Notes (Signed)
Inpatient Diabetes Program Recommendations  AACE/ADA: New Consensus Statement on Inpatient Glycemic Control (2013)  Target Ranges:  Prepandial:   less than 140 mg/dL      Peak postprandial:   less than 180 mg/dL (1-2 hours)      Critically ill patients:  140 - 180 mg/dL  Results for DENNIES, COATE (MRN [id number]) as of 09/11/2014 14:12  Ref. Range 09/10/2014 07:45 09/10/2014 11:52 09/10/2014 16:59 09/11/2014 07:24 09/11/2014 11:27  Glucose-Capillary Latest Range: 70-99 mg/dL 165 (H) 276 (H) 121 (H) 184 (H) 268 (H)   Noted decrease in steroids.  Please consider adding CHO mod to currrent diet. [place], CDE. M.Ed. Pager [phone] Inpatient Diabetes Coordinator

## 2014-09-12 LAB — CBC
HEMATOCRIT: 41.7 % (ref 39.0–52.0)
Hemoglobin: 13.4 g/dL (ref 13.0–17.0)
MCH: 26.6 pg (ref 26.0–34.0)
MCHC: 32.1 g/dL (ref 30.0–36.0)
MCV: 82.7 fL (ref 78.0–100.0)
Platelets: 282 10*3/uL (ref 150–400)
RBC: 5.04 MIL/uL (ref 4.22–5.81)
RDW: 14.4 % (ref 11.5–15.5)
WBC: 12.5 10*3/uL — AB (ref 4.0–10.5)

## 2014-09-12 LAB — GLUCOSE, CAPILLARY
GLUCOSE-CAPILLARY: 301 mg/dL — AB (ref 70–99)
GLUCOSE-CAPILLARY: 186 mg/dL — AB (ref 70–99)
GLUCOSE-CAPILLARY: 448 mg/dL — AB (ref 70–99)
Glucose-Capillary: 309 mg/dL — ABNORMAL HIGH (ref 70–99)

## 2014-09-12 MED ORDER — INSULIN ASPART 100 UNIT/ML ~~LOC~~ SOLN
0.0000 [IU] | Freq: Three times a day (TID) | SUBCUTANEOUS | Status: DC
  Administered 2014-09-13: 15 [IU] via SUBCUTANEOUS
  Administered 2014-09-13: 4 [IU] via SUBCUTANEOUS
  Administered 2014-09-13: 7 [IU] via SUBCUTANEOUS
  Administered 2014-09-14: 4 [IU] via SUBCUTANEOUS
  Administered 2014-09-14: 15 [IU] via SUBCUTANEOUS
  Administered 2014-09-14: 1 [IU] via SUBCUTANEOUS
  Administered 2014-09-15: 7 [IU] via SUBCUTANEOUS

## 2014-09-12 MED ORDER — INSULIN ASPART 100 UNIT/ML ~~LOC~~ SOLN
6.0000 [IU] | Freq: Three times a day (TID) | SUBCUTANEOUS | Status: DC
  Administered 2014-09-13 – 2014-09-15 (×8): 6 [IU] via SUBCUTANEOUS

## 2014-09-12 MED ORDER — INSULIN ASPART 100 UNIT/ML ~~LOC~~ SOLN
0.0000 [IU] | Freq: Every day | SUBCUTANEOUS | Status: DC
  Administered 2014-09-12: 3 [IU] via SUBCUTANEOUS
  Administered 2014-09-14: 2 [IU] via SUBCUTANEOUS

## 2014-09-12 MED ORDER — INSULIN ASPART 100 UNIT/ML ~~LOC~~ SOLN
20.0000 [IU] | Freq: Once | SUBCUTANEOUS | Status: AC
  Administered 2014-09-12: 20 [IU] via SUBCUTANEOUS

## 2014-09-12 NOTE — Progress Notes (Signed)
Patient's HR increases to 140's while ambulating to bathroom and at rest HR is sustaining 115-120's.  MD aware

## 2014-09-12 NOTE — Progress Notes (Signed)
Subjective: He says he feels better. He is still short of breath. He is still coughing but not coughing up as much blood.  Objective: Vital signs in last 24 hours: Temp:  [97.5 F (36.4 C)-97.9 F (36.6 C)] 97.9 F (36.6 C) (03/01 0400) Pulse Rate:  [53-93] 53 (03/01 0600) Resp:  [12-26] 15 (03/01 0600) BP: (104-138)/(64-94) 116/74 mmHg (03/01 0600) SpO2:  [70 %-97 %] 93 % (03/01 0600) FiO2 (%):  [70 %] 70 % (02/29 1430) Weight:  [66.2 kg (145 lb 15.1 oz)] 66.2 kg (145 lb 15.1 oz) (03/01 0500) Weight change: 1.2 kg (2 lb 10.3 oz) Last BM Date: 09/09/14  Intake/Output from previous day: 02/29 0701 - 03/01 0700 In: 240 [P.O.:240] Out: 2475 [Urine:2475]  PHYSICAL EXAM General appearance: alert, cooperative and mild distress Resp: clear to auscultation bilaterally Cardio: regular rate and rhythm, S1, S2 normal, no murmur, click, rub or gallop GI: soft, non-tender; bowel sounds normal; no masses,  no organomegaly Extremities: extremities normal, atraumatic, no cyanosis or edema  Lab Results:  Results for orders placed or performed during the hospital encounter of 09/08/14 (from the past 48 hour(s))  Glucose, capillary     Status: Abnormal   Collection Time: 09/10/14  7:45 AM  Result Value Ref Range   Glucose-Capillary 165 (H) 70 - 99 mg/dL   Comment 1 Notify RN    Comment 2 Document in Chart   Glucose, capillary     Status: Abnormal   Collection Time: 09/10/14 11:52 AM  Result Value Ref Range   Glucose-Capillary 276 (H) 70 - 99 mg/dL   Comment 1 Notify RN   Glucose, capillary     Status: Abnormal   Collection Time: 09/10/14  4:59 PM  Result Value Ref Range   Glucose-Capillary 121 (H) 70 - 99 mg/dL  Blood gas, arterial     Status: Abnormal   Collection Time: 09/10/14  8:39 PM  Result Value Ref Range   FIO2 0.80 %   O2 Content 40.0 L/min   Delivery systems HEATED NASAL CANNULA    pH, Arterial 7.456 (H) 7.350 - 7.450   pCO2 arterial 34.6 (L) 35.0 - 45.0 mmHg   pO2,  Arterial 49.3 (L) 80.0 - 100.0 mmHg   Bicarbonate 24.0 20.0 - 24.0 mEq/L   TCO2 21.2 0 - 100 mmol/L   Acid-Base Excess 0.6 0.0 - 2.0 mmol/L   O2 Saturation 83.6 %   Patient temperature 37.0    Collection site LEFT RADIAL    Drawn by [id number]    Sample type ARTERIAL DRAW    Allens test (pass/fail) PASS PASS  CBC     Status: Abnormal   Collection Time: 09/11/14  4:31 AM  Result Value Ref Range   WBC 10.0 4.0 - 10.5 K/uL   RBC 4.75 4.22 - 5.81 MIL/uL   Hemoglobin 12.5 (L) 13.0 - 17.0 g/dL   HCT 38.7 (L) 39.0 - 52.0 %   MCV 81.5 78.0 - 100.0 fL   MCH 26.3 26.0 - 34.0 pg   MCHC 32.3 30.0 - 36.0 g/dL   RDW 14.3 11.5 - 15.5 %   Platelets 247 150 - 400 K/uL  Glucose, capillary     Status: Abnormal   Collection Time: 09/11/14  7:24 AM  Result Value Ref Range   Glucose-Capillary 184 (H) 70 - 99 mg/dL   Comment 1 Notify RN   Glucose, capillary     Status: Abnormal   Collection Time: 09/11/14 11:27 AM  Result Value Ref Range  Glucose-Capillary 268 (H) 70 - 99 mg/dL   Comment 1 Notify RN   Glucose, capillary     Status: Abnormal   Collection Time: 09/11/14  4:42 PM  Result Value Ref Range   Glucose-Capillary 208 (H) 70 - 99 mg/dL  Glucose, capillary     Status: Abnormal   Collection Time: 09/11/14  9:11 PM  Result Value Ref Range   Glucose-Capillary 301 (H) 70 - 99 mg/dL   Comment 1 Notify RN   CBC     Status: Abnormal   Collection Time: 09/12/14  4:05 AM  Result Value Ref Range   WBC 12.5 (H) 4.0 - 10.5 K/uL   RBC 5.04 4.22 - 5.81 MIL/uL   Hemoglobin 13.4 13.0 - 17.0 g/dL   HCT 41.7 39.0 - 52.0 %   MCV 82.7 78.0 - 100.0 fL   MCH 26.6 26.0 - 34.0 pg   MCHC 32.1 30.0 - 36.0 g/dL   RDW 14.4 11.5 - 15.5 %   Platelets 282 150 - 400 K/uL    ABGS  Recent Labs  09/10/14 2039  PHART 7.456*  PO2ART 49.3*  TCO2 21.2  HCO3 24.0   CULTURES Recent Results (from the past 240 hour(s))  Blood culture (routine x 2)     Status: None (Preliminary result)   Collection Time: 09/08/14  11:12 AM  Result Value Ref Range Status   Specimen Description RIGHT ANTECUBITAL  Final   Special Requests BOTTLES DRAWN AEROBIC AND ANAEROBIC 6CC  Final   Culture NO GROWTH 3 DAYS  Final   Report Status PENDING  Incomplete  Blood culture (routine x 2)     Status: None (Preliminary result)   Collection Time: 09/08/14 11:20 AM  Result Value Ref Range Status   Specimen Description BLOOD RIGHT HAND  Final   Special Requests BOTTLES DRAWN AEROBIC AND ANAEROBIC 6CC  Final   Culture NO GROWTH 3 DAYS  Final   Report Status PENDING  Incomplete  MRSA PCR Screening     Status: None   Collection Time: 09/08/14  3:30 PM  Result Value Ref Range Status   MRSA by PCR NEGATIVE NEGATIVE Final    Comment:        The GeneXpert MRSA Assay (FDA approved for NASAL specimens only), is one component of a comprehensive MRSA colonization surveillance program. It is not intended to diagnose MRSA infection nor to guide or monitor treatment for MRSA infections.    Studies/Results: Dg Chest 2 View  09/10/2014   CLINICAL DATA:  Shortness of breath  EXAM: CHEST  2 VIEW  COMPARISON:  09/08/2014  FINDINGS: Chronic interstitial markings with fibrosis in the left lung apex and bilateral lung bases.  Superimposed lingular/left lower lobe pneumonia is suspected.  No pleural effusion or pneumothorax.  The heart is normal in size.  IMPRESSION: Lingular/left lower lobe pneumonia.  Underlying chronic interstitial lung disease/fibrosis.   Electronically Signed   By: Julian Hy M.D.   On: 09/10/2014 12:00    Medications:  Prior to Admission:  Prescriptions prior to admission  Medication Sig Dispense Refill Last Dose  . albuterol (PROVENTIL) (2.5 MG/3ML) 0.083% nebulizer solution Take 2.5 mg by nebulization every 6 (six) hours as needed for wheezing or shortness of breath.   09/07/2014 at Unknown time  . budesonide-formoterol (SYMBICORT) 80-4.5 MCG/ACT inhaler Inhale 2 puffs into the lungs 2 (two) times daily. 1  Inhaler 12 09/07/2014 at Unknown time  . diltiazem (CARDIZEM CD) 180 MG 24 hr capsule Take 180 mg  by mouth daily.  0 09/07/2014 at Unknown time  . fluticasone (FLONASE) 50 MCG/ACT nasal spray Place 2 sprays into both nostrils daily.   09/07/2014 at Unknown time  . Fluticasone-Salmeterol (ADVAIR DISKUS) 250-50 MCG/DOSE AEPB Inhale 1 puff into the lungs every 12 (twelve) hours.    09/07/2014 at Unknown time  . tiotropium (SPIRIVA) 18 MCG inhalation capsule Place 18 mcg into inhaler and inhale daily.   09/07/2014 at Unknown time  . albuterol (VENTOLIN HFA) 108 (90 BASE) MCG/ACT inhaler Inhale 2 puffs into the lungs every 4 (four) hours as needed. Shortness of breath   unknown  . diltiazem (CARDIZEM CD) 120 MG 24 hr capsule Take 1 capsule (120 mg total) by mouth daily. (Patient not taking: Reported on 09/08/2014) 90 capsule 3   . edoxaban (SAVAYSA) 60 MG TABS tablet Take 60 mg by mouth daily. 30 tablet 3 8 days  . nitroGLYCERIN (NITROSTAT) 0.4 MG SL tablet Place 1 tablet (0.4 mg total) under the tongue every 5 (five) minutes as needed for chest pain. 25 tablet 4 unknown   Scheduled: . sodium chloride   Intravenous Once  . azithromycin  500 mg Oral Daily  . budesonide-formoterol  2 puff Inhalation BID  . cefTRIAXone (ROCEPHIN)  IV  1 g Intravenous Q24H  . diltiazem  180 mg Oral Daily  . enoxaparin (LOVENOX) injection  40 mg Subcutaneous Q24H  . fluticasone  2 spray Each Nare Daily  . guaiFENesin  1,200 mg Oral BID  . insulin aspart  0-15 Units Subcutaneous TID WC  . insulin aspart  0-5 Units Subcutaneous QHS  . levalbuterol  0.63 mg Nebulization Q6H  . methylPREDNISolone (SOLU-MEDROL) injection  60 mg Intravenous Q12H  . nicotine  21 mg Transdermal Daily  . pantoprazole  40 mg Oral Q1200  . sodium chloride  3 mL Intravenous Q12H  . tiotropium  18 mcg Inhalation Daily   Continuous:  BJY:NWGNFA chloride, acetaminophen **OR** acetaminophen, alum & mag hydroxide-simeth, benzonatate, levalbuterol,  LORazepam, morphine injection, nitroGLYCERIN, ondansetron **OR** ondansetron (ZOFRAN) IV, oxyCODONE, senna-docusate, sodium chloride, traZODone  Assesment: He was admitted with acute on chronic respiratory failure on the basis of community-acquired pneumonia and COPD. He has also had atrial fibrillation with rapid ventricular response thought to be related to his COPD exacerbation. He is generally improving. His oxygen requirement seems to have decreased. His steroids were decreased yesterday. He is continuing on antibiotics. 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    LOS: 4 days   Breland Trouten L 09/12/2014, 7:37 AM

## 2014-09-12 NOTE — Progress Notes (Signed)
[facility] PROGRESS NOTE  Cheikh Bramble Koker UXN:[id number] DOB: Jul 04, 1961 DOA: 09/08/2014 PCP: Dena Billet BETH, PA-C  Assessment/Plan: Acute Chronic Hypoxemic Respiratory Failure -Related to CAP in the setting of COPD and what appears to be interstitial lung disease. -Continue oxygen as needed. Now back on [place]. -Will leave steroids at current dose today as still some wheezing on lung auscultation. -Appreciate pulmonary assistance. -Has a component of anxiety it seems. -See below for details.  CAP -Continue rocephin/azithro. -Cx data remains negative to date. -Afebrile/leukocytosis improving.  COPD with Acute Exacerbation -Still with wheezing today. -Continue steroids and PRN nebs.  A Fib with RVR -Has chronic a fib. -Rapid rates likely related to ongoing pulmonary issues. -Cardizem drip weaned off 2/27. -He was on saveysa which he self-discontinued due to hemoptysis, which he continues to have in small amounts. Will not restart anticoagulation for now. -Rates increase with activity, but are mostly controlled.  DM -Patient is diet-controlled at home. -Has had elevated CBGs past 24 hours, likely associated with steroid use. -Start SSI and night and meal coverage.  Code Status: Full Code Family Communication: Patient only  Disposition Plan: Home when ready. Transfer to floor today.  Consultants:  Pulmonary, Dr. Luan Pulling   Antibiotics:  Rocephin  Azithro   Subjective: Feels better altho still SOB. Slept well last pm with ambien.  Objective: Filed Vitals:   09/12/14 0910 09/12/14 0918 09/12/14 1319 09/12/14 1600  BP:    114/67  Pulse: 101 98  102  Temp:    98.2 F (36.8 C)  TempSrc:    Oral  Resp: 19 17  18   Height:      Weight:      SpO2: 88% 88% 90% 93%    Intake/Output Summary (Last 24 hours) at 09/12/14 1816 Last data filed at 09/12/14 1053  Gross per 24 hour  Intake    240 ml  Output   1575 ml  Net  -1335 ml   Filed Weights   09/10/14  0500 09/11/14 0500 09/12/14 0500  Weight: 64 kg (141 lb 1.5 oz) 65 kg (143 lb 4.8 oz) 66.2 kg (145 lb 15.1 oz)    Exam:   General:  AA Ox3  Cardiovascular: irregular, not tachy  Respiratory: coarse BS no wheezing.  Abdomen: S/NT/ND/+BA  Extremities: no C/C/E   Neurologic:  Intact/non-focal  Data Reviewed: Basic Metabolic Panel:  Recent Labs Lab 09/08/14 0942 09/09/14 0542 09/10/14 0528  NA 135 135 135  K 3.8 4.4 5.0  CL 103 104 103  CO2 25 23 24   GLUCOSE 178* 239* 142*  BUN 10 19 28*  CREATININE 0.94 1.32 0.99  CALCIUM 8.2* 8.2* 8.6   Liver Function Tests:  Recent Labs Lab 09/08/14 0942  AST 19  ALT 16  ALKPHOS 65  BILITOT 0.3  PROT 7.1  ALBUMIN 3.1*   No results for input(s): LIPASE, AMYLASE in the last 168 hours. No results for input(s): AMMONIA in the last 168 hours. CBC:  Recent Labs Lab 09/08/14 0942 09/09/14 0542 09/10/14 0528 09/11/14 0431 09/12/14 0405  WBC 7.7 10.3 18.2* 10.0 12.5*  NEUTROABS 5.1  --   --   --   --   HGB 13.7 11.2* 12.5* 12.5* 13.4  HCT 41.7 35.0* 38.3* 38.7* 41.7  MCV 81.0 81.8 82.0 81.5 82.7  PLT 251 218 263 247 282   Cardiac Enzymes: No results for input(s): CKTOTAL, CKMB, CKMBINDEX, TROPONINI in the last 168 hours. BNP (last 3 results) No results for input(s): BNP  in the last 8760 hours.  ProBNP (last 3 results)  Recent Labs  10/31/13 0152  PROBNP 359.3*    CBG:  Recent Labs Lab 09/11/14 1642 09/11/14 2111 09/12/14 0723 09/12/14 1153 09/12/14 1646  GLUCAP 208* 301* 186* 448* 309*    Recent Results (from the past 240 hour(s))  Blood culture (routine x 2)     Status: None (Preliminary result)   Collection Time: 09/08/14 11:12 AM  Result Value Ref Range Status   Specimen Description BLOOD RIGHT ANTECUBITAL  Final   Special Requests BOTTLES DRAWN AEROBIC AND ANAEROBIC 6CC  Final   Culture NO GROWTH 4 DAYS  Final   Report Status PENDING  Incomplete  Blood culture (routine x 2)     Status: None  (Preliminary result)   Collection Time: 09/08/14 11:20 AM  Result Value Ref Range Status   Specimen Description BLOOD RIGHT HAND  Final   Special Requests BOTTLES DRAWN AEROBIC AND ANAEROBIC 6CC  Final   Culture NO GROWTH 4 DAYS  Final   Report Status PENDING  Incomplete  MRSA PCR Screening     Status: None   Collection Time: 09/08/14  3:30 PM  Result Value Ref Range Status   MRSA by PCR NEGATIVE NEGATIVE Final    Comment:        The GeneXpert MRSA Assay (FDA approved for NASAL specimens only), is one component of a comprehensive MRSA colonization surveillance program. It is not intended to diagnose MRSA infection nor to guide or monitor treatment for MRSA infections.      Studies: No results found.  Scheduled Meds: . sodium chloride   Intravenous Once  . azithromycin  500 mg Oral Daily  . budesonide-formoterol  2 puff Inhalation BID  . cefTRIAXone (ROCEPHIN)  IV  1 g Intravenous Q24H  . diltiazem  180 mg Oral Daily  . enoxaparin (LOVENOX) injection  40 mg Subcutaneous Q24H  . fluticasone  2 spray Each Nare Daily  . guaiFENesin  1,200 mg Oral BID  . [START ON 09/13/2014] insulin aspart  0-20 Units Subcutaneous TID WC  . insulin aspart  0-5 Units Subcutaneous QHS  . [START ON 09/13/2014] insulin aspart  6 Units Subcutaneous TID WC  . levalbuterol  0.63 mg Nebulization Q6H  . methylPREDNISolone (SOLU-MEDROL) injection  60 mg Intravenous Q12H  . nicotine  21 mg Transdermal Daily  . pantoprazole  40 mg Oral Q1200  . sodium chloride  3 mL Intravenous Q12H  . tiotropium  18 mcg Inhalation Daily   Continuous Infusions:    Principal Problem:   Acute on chronic respiratory failure Active Problems:   COPD (chronic obstructive pulmonary disease)   ASCVD (arteriosclerotic cardiovascular disease)   Glucose intolerance (pre-diabetes)   Atrial fibrillation with rapid ventricular response   CAP (community acquired pneumonia)   Hyperglycemia   A-fib    Time spent: 25 minutes.  Greater than 50% of this time was spent in direct contact with the patient coordinating care.    Lelon Frohlich  [facility] Pager [phone]  If 7PM-7AM, please contact night-coverage at [email or web address], password [facility] 09/12/2014, 6:16 PM  LOS: 4 days

## 2014-09-12 NOTE — Progress Notes (Signed)
PT Cancellation Note  Patient Details Name: NIKLAUS MAMARIL MRN: [id number] DOB: 10-22-1960   Cancelled Treatment:    Reason Eval/Treat Not Completed: Medical issues which prohibited therapy.  Resting HR in the 130s, pt sitting at EOB.  RN aware and has called MD.  Will try again tomorrow.   Demetrios Isaacs L 09/12/2014, 1:54 PM

## 2014-09-12 NOTE — Progress Notes (Signed)
Inpatient Diabetes Program Recommendations  AACE/ADA: New Consensus Statement on Inpatient Glycemic Control (2013)  Target Ranges:  Prepandial:   less than 140 mg/dL      Peak postprandial:   less than 180 mg/dL (1-2 hours)      Critically ill patients:  140 - 180 mg/dL   Results for Ryan [name], Ryan [name] (MRN [id number]) as of 09/12/2014 08:07  Ref. Range 09/11/2014 07:24 09/11/2014 11:27 09/11/2014 16:42 09/11/2014 21:11 09/12/2014 07:23  Glucose-Capillary Latest Range: 70-99 mg/dL 184 (H) 268 (H) 208 (H) 301 (H) 186 (H)   Diabetes history: DM2 Outpatient Diabetes medications: None Current orders for Inpatient glycemic control: Novolog 0-15 units TID with meals, Novolog 0-5 units HS  Inpatient Diabetes Program Recommendations Insulin - Meal Coverage: While inpatient and ordered steroids, please consider ordering Novolog 5 units TID with meals for meal coverage (in addition to Novolog correction). HgbA1C: A1C was 7.0% on 09/08/14. Patient has a history of DM but does not take any outpatient DM medications. May want to consider starting on oral DM medication at discharge or have patient follow up with PCP.  Thanks, Barnie Alderman, RN, MSN, CCRN, CDE Diabetes Coordinator Inpatient Diabetes Program [phone] (Team Pager) [phone] ([facility] office) [phone] [facility] office)

## 2014-09-12 NOTE — Progress Notes (Signed)
Patient asleep on 6lpm/[place] Saturation 92 to 93 will leave off high flow oxygen for now.

## 2014-09-12 NOTE — Progress Notes (Signed)
Patient transferred to room 330. Report given to [facility] LPN. Vital signs stable at transfer.

## 2014-09-13 ENCOUNTER — Encounter (HOSPITAL_COMMUNITY): Payer: Self-pay | Admitting: Internal Medicine

## 2014-09-13 DIAGNOSIS — E119 Type 2 diabetes mellitus without complications: Secondary | ICD-10-CM

## 2014-09-13 DIAGNOSIS — I482 Chronic atrial fibrillation: Secondary | ICD-10-CM

## 2014-09-13 LAB — CULTURE, BLOOD (ROUTINE X 2)
Culture: NO GROWTH
Culture: NO GROWTH

## 2014-09-13 LAB — BASIC METABOLIC PANEL
Anion gap: 7 (ref 5–15)
BUN: 21 mg/dL (ref 6–23)
CALCIUM: 8.2 mg/dL — AB (ref 8.4–10.5)
CHLORIDE: 97 mmol/L (ref 96–112)
CO2: 33 mmol/L — ABNORMAL HIGH (ref 19–32)
CREATININE: 1.02 mg/dL (ref 0.50–1.35)
GFR calc Af Amer: >90 mL/min (ref >90)
GFR calc non Af Amer: 81 mL/min — ABNORMAL LOW (ref >90)
GLUCOSE: 257 mg/dL — AB (ref 70–99)
Potassium: 4.9 mmol/L (ref 3.5–5.1)
Sodium: 137 mmol/L (ref 135–145)

## 2014-09-13 LAB — GLUCOSE, CAPILLARY
GLUCOSE-CAPILLARY: 267 mg/dL — AB (ref 70–99)
GLUCOSE-CAPILLARY: 243 mg/dL — AB (ref 70–99)
GLUCOSE-CAPILLARY: 171 mg/dL — AB (ref 70–99)
Glucose-Capillary: 199 mg/dL — ABNORMAL HIGH (ref 70–99)
Glucose-Capillary: 329 mg/dL — ABNORMAL HIGH (ref 70–99)

## 2014-09-13 LAB — CBC
HEMATOCRIT: 38.7 % — AB (ref 39.0–52.0)
Hemoglobin: 12.5 g/dL — ABNORMAL LOW (ref 13.0–17.0)
MCH: 26.4 pg (ref 26.0–34.0)
MCHC: 32.3 g/dL (ref 30.0–36.0)
MCV: 81.6 fL (ref 78.0–100.0)
Platelets: 233 10*3/uL (ref 150–400)
RBC: 4.74 MIL/uL (ref 4.22–5.81)
RDW: 14.2 % (ref 11.5–15.5)
WBC: 8.9 10*3/uL (ref 4.0–10.5)

## 2014-09-13 MED ORDER — METHYLPREDNISOLONE SODIUM SUCC 40 MG IJ SOLR
40.0000 mg | Freq: Two times a day (BID) | INTRAMUSCULAR | Status: DC
  Administered 2014-09-13 – 2014-09-14 (×2): 40 mg via INTRAVENOUS
  Filled 2014-09-13 (×2): qty 1

## 2014-09-13 MED ORDER — OXYCODONE HCL 5 MG PO TABS
5.0000 mg | ORAL_TABLET | ORAL | Status: DC | PRN
  Administered 2014-09-13 – 2014-09-15 (×10): 5 mg via ORAL
  Filled 2014-09-13 (×10): qty 1

## 2014-09-13 MED ORDER — INSULIN GLARGINE 100 UNIT/ML ~~LOC~~ SOLN
12.0000 [IU] | Freq: Every day | SUBCUTANEOUS | Status: DC
  Administered 2014-09-13: 12 [IU] via SUBCUTANEOUS
  Filled 2014-09-13 (×2): qty 0.12

## 2014-09-13 NOTE — Evaluation (Signed)
Physical Therapy Evaluation Patient Details Name: BETTY BROOKS MRN: [id number] DOB: 1960-08-29 Today's Date: 09/13/2014   History of Present Illness  Emillio Ngo Zollars is a 54 y.o. male with a past medical history that includes COPD on oxygen at home at 2 L, assistance/reoccurring pneumonia since December 2015, cavitary lesion of the lung, GERD, seizures, diabetes, chronic back pain, A. fib stopped taking his anticoagulation medicine 8 days ago presents to the emergency department with chief complaint of persistent and worsening shortness of breath. Initial evaluation reveals Infiltrate in the apical segment of the left upper lobe. Underlying emphysema with areas of scarring and bibasilar interstitial  Clinical Impression  Pt states that he is still smoking.  Therapist discussed the benefits of quitting.  Therapist educated pt on the importance of nose vs. Mouth breathing.  Pt limitations appear respiratory in nature and not physical.     Follow Up Recommendations No PT follow up    Equipment Recommendations    O2   Recommendations for Other Services   none    Precautions / Restrictions Precautions Precautions: None Restrictions Weight Bearing Restrictions: No      Mobility  Bed Mobility Overal bed mobility: Modified Independent                Transfers Overall transfer level: Modified independent                  Ambulation/Gait Ambulation/Gait assistance: Modified independent (Device/Increase time) Ambulation Distance (Feet): 75 Feet Assistive device: None Gait Pattern/deviations: Decreased step length - right;Decreased step length - left   Gait velocity interpretation: Below normal speed for age/gender General Gait Details: Pt on 6 L O2   Pt O2 decreased to 88 with ambulation but recovered after a minute of sitting and deep breathing.            Pertinent Vitals/Pain Pain Assessment: No/denies pain    Home Living Family/patient expects to be  discharged to:: Private residence Living Arrangements: Spouse/significant other Available Help at Discharge: Family Type of Home: House Home Access: Stairs to enter     Home Layout: One level Home Equipment: None      Prior Function Level of Independence: Independent         Comments: with O2     Hand Dominance   Dominant Hand: Right    Extremity/Trunk Assessment               Lower Extremity Assessment: Overall WFL for tasks assessed         Communication   Communication: No difficulties  Cognition Arousal/Alertness: Awake/alert   Overall Cognitive Status: Within Functional Limits for tasks assessed                               Assessment/Plan    PT Assessment Patent does not need any further PT services  PT Diagnosis     PT Problem List    PT Treatment Interventions     PT Goals (Current goals can be found in the Care Plan section)                 End of Session Equipment Utilized During Treatment: Gait belt Activity Tolerance: Other (comment) (limited by SOB) Patient left: in bed;with call bell/phone within reach;with family/visitor present           Time: 6256-3893 PT Time Calculation (min) (ACUTE ONLY): 30 min   Charges:   PT  Evaluation $Initial PT Evaluation Tier I: 1 Procedure     PT G Codes:        [id number] 10-05-14, 10:56 AM

## 2014-09-13 NOTE — Progress Notes (Signed)
Subjective: He says he feels okay. He has no new complaints. He is still coughing up sputum  Objective: Vital signs in last 24 hours: Temp:  [98 F (36.7 C)-98.6 F (37 C)] 98 F (36.7 C) (03/02 0514) Pulse Rate:  [70-102] 70 (03/02 0514) Resp:  [17-23] 18 (03/02 0514) BP: (114-123)/(67-78) 114/69 mmHg (03/02 0514) SpO2:  [88 %-96 %] 89 % (03/02 0745) Weight change:  Last BM Date: 09/11/14  Intake/Output from previous day: 03/01 0701 - 03/02 0700 In: 1151.5 [I.V.:951.5; IV Piggyback:200] Out: 1725 [Urine:1725]  PHYSICAL EXAM General appearance: alert, cooperative and no distress Resp: clear to auscultation bilaterally Cardio: irregularly irregular rhythm GI: soft, non-tender; bowel sounds normal; no masses,  no organomegaly Extremities: extremities normal, atraumatic, no cyanosis or edema  Lab Results:  Results for orders placed or performed during the hospital encounter of 09/08/14 (from the past 48 hour(s))  Glucose, capillary     Status: Abnormal   Collection Time: 09/11/14 11:27 AM  Result Value Ref Range   Glucose-Capillary 268 (H) 70 - 99 mg/dL   Comment 1 Notify RN   Glucose, capillary     Status: Abnormal   Collection Time: 09/11/14  4:42 PM  Result Value Ref Range   Glucose-Capillary 208 (H) 70 - 99 mg/dL  Glucose, capillary     Status: Abnormal   Collection Time: 09/11/14  9:11 PM  Result Value Ref Range   Glucose-Capillary 301 (H) 70 - 99 mg/dL   Comment 1 Notify RN   CBC     Status: Abnormal   Collection Time: 09/12/14  4:05 AM  Result Value Ref Range   WBC 12.5 (H) 4.0 - 10.5 K/uL   RBC 5.04 4.22 - 5.81 MIL/uL   Hemoglobin 13.4 13.0 - 17.0 g/dL   HCT 41.7 39.0 - 52.0 %   MCV 82.7 78.0 - 100.0 fL   MCH 26.6 26.0 - 34.0 pg   MCHC 32.1 30.0 - 36.0 g/dL   RDW 14.4 11.5 - 15.5 %   Platelets 282 150 - 400 K/uL  Glucose, capillary     Status: Abnormal   Collection Time: 09/12/14  7:23 AM  Result Value Ref Range   Glucose-Capillary 186 (H) 70 - 99  mg/dL  Glucose, capillary     Status: Abnormal   Collection Time: 09/12/14 11:53 AM  Result Value Ref Range   Glucose-Capillary 448 (H) 70 - 99 mg/dL   Comment 1 Notify RN   Glucose, capillary     Status: Abnormal   Collection Time: 09/12/14  4:46 PM  Result Value Ref Range   Glucose-Capillary 309 (H) 70 - 99 mg/dL   Comment 1 Notify RN   Glucose, capillary     Status: Abnormal   Collection Time: 09/12/14  8:49 PM  Result Value Ref Range   Glucose-Capillary 267 (H) 70 - 99 mg/dL   Comment 1 Notify RN   Basic metabolic panel     Status: Abnormal   Collection Time: 09/13/14  6:12 AM  Result Value Ref Range   Sodium 137 135 - 145 mmol/[name]   Potassium 4.9 3.5 - 5.1 mmol/[name]   Chloride 97 96 - 112 mmol/[name]   CO2 33 (H) 19 - 32 mmol/[name]   Glucose, Bld 257 (H) 70 - 99 mg/dL   BUN 21 6 - 23 mg/dL   Creatinine, Ser 1.02 0.50 - 1.35 mg/dL   Calcium 8.2 ([name]) 8.4 - 10.5 mg/dL   GFR calc non Af Amer 81 ([name]) >90 mL/min  GFR calc Af Amer >90 >90 mL/min    Comment: (NOTE) The eGFR has been calculated using the CKD EPI equation. This calculation has not been validated in all clinical situations. eGFR's persistently <90 mL/min signify possible Chronic Kidney Disease.    Anion gap 7 5 - 15  CBC     Status: Abnormal   Collection Time: 09/13/14  6:12 AM  Result Value Ref Range   WBC 8.9 4.0 - 10.5 K/uL   RBC 4.74 4.22 - 5.81 MIL/uL   Hemoglobin 12.5 ([name]) 13.0 - 17.0 g/dL   HCT 38.7 ([name]) 39.0 - 52.0 %   MCV 81.6 78.0 - 100.0 fL   MCH 26.4 26.0 - 34.0 pg   MCHC 32.3 30.0 - 36.0 g/dL   RDW 14.2 11.5 - 15.5 %   Platelets 233 150 - 400 K/uL  Glucose, capillary     Status: Abnormal   Collection Time: 09/13/14  7:42 AM  Result Value Ref Range   Glucose-Capillary 243 (H) 70 - 99 mg/dL   Comment 1 Notify RN    Comment 2 Document in Chart     ABGS  Recent Labs  09/10/14 2039  PHART 7.456*  PO2ART 49.3*  TCO2 21.2  HCO3 24.0   CULTURES Recent Results (from the past 240 hour(s))  Blood  culture (routine x 2)     Status: None (Preliminary result)   Collection Time: 09/08/14 11:12 AM  Result Value Ref Range Status   Specimen Description BLOOD RIGHT ANTECUBITAL  Final   Special Requests BOTTLES DRAWN AEROBIC AND ANAEROBIC 6CC  Final   Culture NO GROWTH 4 DAYS  Final   Report Status PENDING  Incomplete  Blood culture (routine x 2)     Status: None (Preliminary result)   Collection Time: 09/08/14 11:20 AM  Result Value Ref Range Status   Specimen Description BLOOD RIGHT HAND  Final   Special Requests BOTTLES DRAWN AEROBIC AND ANAEROBIC 6CC  Final   Culture NO GROWTH 4 DAYS  Final   Report Status PENDING  Incomplete  MRSA PCR Screening     Status: None   Collection Time: 09/08/14  3:30 PM  Result Value Ref Range Status   MRSA by PCR NEGATIVE NEGATIVE Final    Comment:        The GeneXpert MRSA Assay (FDA approved for NASAL specimens only), is one component of a comprehensive MRSA colonization surveillance program. It is not intended to diagnose MRSA infection nor to guide or monitor treatment for MRSA infections.    Studies/Results: No results found.  Medications:  Prior to Admission:  Prescriptions prior to admission  Medication Sig Dispense Refill Last Dose  . albuterol (PROVENTIL) (2.5 MG/3ML) 0.083% nebulizer solution Take 2.5 mg by nebulization every 6 (six) hours as needed for wheezing or shortness of breath.   09/07/2014 at Unknown time  . budesonide-formoterol (SYMBICORT) 80-4.5 MCG/ACT inhaler Inhale 2 puffs into the lungs 2 (two) times daily. 1 Inhaler 12 09/07/2014 at Unknown time  . diltiazem (CARDIZEM CD) 180 MG 24 hr capsule Take 180 mg by mouth daily.  0 09/07/2014 at Unknown time  . fluticasone (FLONASE) 50 MCG/ACT nasal spray Place 2 sprays into both nostrils daily.   09/07/2014 at Unknown time  . Fluticasone-Salmeterol (ADVAIR DISKUS) 250-50 MCG/DOSE AEPB Inhale 1 puff into the lungs every 12 (twelve) hours.    09/07/2014 at Unknown time  .  tiotropium (SPIRIVA) 18 MCG inhalation capsule Place 18 mcg into inhaler and inhale daily.  09/07/2014 at Unknown time  . albuterol (VENTOLIN HFA) 108 (90 BASE) MCG/ACT inhaler Inhale 2 puffs into the lungs every 4 (four) hours as needed. Shortness of breath   unknown  . diltiazem (CARDIZEM CD) 120 MG 24 hr capsule Take 1 capsule (120 mg total) by mouth daily. (Patient not taking: Reported on 09/08/2014) 90 capsule 3   . edoxaban (SAVAYSA) 60 MG TABS tablet Take 60 mg by mouth daily. 30 tablet 3 8 days  . nitroGLYCERIN (NITROSTAT) 0.4 MG SL tablet Place 1 tablet (0.4 mg total) under the tongue every 5 (five) minutes as needed for chest pain. 25 tablet 4 unknown   Scheduled: . sodium chloride   Intravenous Once  . azithromycin  500 mg Oral Daily  . budesonide-formoterol  2 puff Inhalation BID  . cefTRIAXone (ROCEPHIN)  IV  1 g Intravenous Q24H  . diltiazem  180 mg Oral Daily  . enoxaparin (LOVENOX) injection  40 mg Subcutaneous Q24H  . fluticasone  2 spray Each Nare Daily  . guaiFENesin  1,200 mg Oral BID  . insulin aspart  0-20 Units Subcutaneous TID WC  . insulin aspart  0-5 Units Subcutaneous QHS  . insulin aspart  6 Units Subcutaneous TID WC  . levalbuterol  0.63 mg Nebulization Q6H  . methylPREDNISolone (SOLU-MEDROL) injection  60 mg Intravenous Q12H  . nicotine  21 mg Transdermal Daily  . pantoprazole  40 mg Oral Q1200  . sodium chloride  3 mL Intravenous Q12H  . tiotropium  18 mcg Inhalation Daily   Continuous:  RUE:AVWUJW chloride, acetaminophen **OR** acetaminophen, alum & mag hydroxide-simeth, benzonatate, levalbuterol, LORazepam, morphine injection, nitroGLYCERIN, ondansetron **OR** ondansetron (ZOFRAN) IV, oxyCODONE, senna-docusate, sodium chloride, traZODone  Assesment: He was admitted with acute on chronic respiratory failure on the basis of COPD and community-acquired pneumonia. He has improved substantially. He is still coughing up blood and that has been something of a  chronic problem for him that I think is associated with bronchiectasis. He has had atrial fibrillation with rapid ventricular response and that has improved Principal Problem:   Acute on chronic respiratory failure Active Problems:   COPD (chronic obstructive pulmonary disease)   ASCVD (arteriosclerotic cardiovascular disease)   Glucose intolerance (pre-diabetes)   Atrial fibrillation with rapid ventricular response   CAP (community acquired pneumonia)   Hyperglycemia   A-fib    Plan: Continue current treatments I think he is close to baseline from a pulmonary point of view    LOS: 5 days   Ryan [name] 09/13/2014, 8:45 AM

## 2014-09-13 NOTE — Progress Notes (Addendum)
[facility] PROGRESS NOTE  Ryan [name] WEX:[id number] DOB: 1961-02-09 DOA: 09/08/2014 PCP: Dena Billet BETH, PA-C  Assessment/Plan: Acute Chronic Hypoxemic Respiratory Failure -Related to CAP in the setting of COPD and what appears to be interstitial lung disease. -Continue oxygen therapy titrated to keep his oxygen saturations at a minimum of 89%. -Will decrease steroids as he has no significant wheezing on exam. -Appreciate pulmonary assistance. -Has a component of anxiety it seems. -See below for details.  CAP -Continue rocephin/azithro. -Strep pneumo antigen and Legionella antigen negative. -Blood culture 2 negative to date. Respiratory virus panel pending. -He is afebrile and his white blood cell count has trended downward to within normal limits.  COPD with Acute Exacerbation -He has wheezing today. -Continue steroids, but will decrease the dosing. -Continue Symbicort and Xopenex nebulizer and Spiriva.  Mild hemoptysis, per history given. -Likely secondary to a pneumonia/COPD or possibly bronchiectasis as per Dr. Luan Pulling. -Hemoglobin/hematocrit more or less stable. -Continue to hold anticoagulant and monitor.   Tobacco abuse. -He was advised to stop smoking. We'll continue nicotine replacement therapy.  A Fib with RVR -Has chronic a fib. -Rapid rates were likely related to COPD exacerbation and CAP. -Cardizem drip started and weaned off 2/27. -We'll restart oral diltiazem. -He was on saveysa which he self-discontinued due to hemoptysis, which he continues to have in small amounts. -Will not restart anticoagulation for now. -Rates increase with activity, but are now mostly controlled.  DM, type II -Patient is diet-controlled at home, but will use metformin when necessary for elevated blood sugars. -His hemoglobin A1c was 7.0 on 09/08/14. -Has had elevated CBGs, likely associated with steroid use. -We'll continue sliding scale NovoLog and mealtime  coverage. Will add bedtime Lantus. -Expect CBGs to improve with weaning down of IV Solu-Medrol.  Code Status: Full Code Family Communication: Patient only  Disposition Plan: Home when ready. Transfer to floor today.  Consultants:  Pulmonary, Dr. Luan Pulling   Antibiotics:  Rocephin  Azithro   Subjective: Patient complains of some generalized swelling in his legs and his hands. He says that he is breathing better, but not quite back to his normal breathing.  Objective: Filed Vitals:   09/12/14 1951 09/12/14 2144 09/13/14 0514 09/13/14 0745  BP:  122/78 114/69   Pulse:  97 70   Temp:  98.6 F (37 C) 98 F (36.7 C)   TempSrc:  Oral Oral   Resp:  18 18   Height:      Weight:      SpO2: 91% 96% 91% 89%    Intake/Output Summary (Last 24 hours) at 09/13/14 1124 Last data filed at 09/13/14 0541  Gross per 24 hour  Intake 1151.5 ml  Output   1500 ml  Net -348.5 ml   Filed Weights   09/10/14 0500 09/11/14 0500 09/12/14 0500  Weight: 64 kg (141 lb 1.5 oz) 65 kg (143 lb 4.8 oz) 66.2 kg (145 lb 15.1 oz)    Exam:   General:  54 year old Caucasian man sitting up in bed, in no acute distress.  Cardiovascular: irregular, irregular  Respiratory: coarse breath sounds with rare wheezes; breathing nonlabored.  Abdomen: Positive bowel sounds, soft, nontender, nondistended.  Extremities: Trace of pedal edema.   Neurologic:  Alert and oriented 3.  Data Reviewed: Basic Metabolic Panel:  Recent Labs Lab 09/08/14 0942 09/09/14 0542 09/10/14 0528 09/13/14 0612  NA 135 135 135 137  K 3.8 4.4 5.0 4.9  CL 103 104 103 97  CO2 25 23 24  33*  GLUCOSE 178* 239* 142* 257*  BUN 10 19 28* 21  CREATININE 0.94 1.32 0.99 1.02  CALCIUM 8.2* 8.2* 8.6 8.2*   Liver Function Tests:  Recent Labs Lab 09/08/14 0942  AST 19  ALT 16  ALKPHOS 65  BILITOT 0.3  PROT 7.1  ALBUMIN 3.1*   No results for input(s): LIPASE, AMYLASE in the last 168 hours. No results for input(s): AMMONIA  in the last 168 hours. CBC:  Recent Labs Lab 09/08/14 0942 09/09/14 0542 09/10/14 0528 09/11/14 0431 09/12/14 0405 09/13/14 0612  WBC 7.7 10.3 18.2* 10.0 12.5* 8.9  NEUTROABS 5.1  --   --   --   --   --   HGB 13.7 11.2* 12.5* 12.5* 13.4 12.5*  HCT 41.7 35.0* 38.3* 38.7* 41.7 38.7*  MCV 81.0 81.8 82.0 81.5 82.7 81.6  PLT 251 218 263 247 282 233   Cardiac Enzymes: No results for input(s): CKTOTAL, CKMB, CKMBINDEX, TROPONINI in the last 168 hours. BNP (last 3 results) No results for input(s): BNP in the last 8760 hours.  ProBNP (last 3 results)  Recent Labs  10/31/13 0152  PROBNP 359.3*    CBG:  Recent Labs Lab 09/12/14 1153 09/12/14 1646 09/12/14 2049 09/13/14 0742 09/13/14 1112  GLUCAP 448* 309* 267* 243* 329*    Recent Results (from the past 240 hour(s))  Blood culture (routine x 2)     Status: None   Collection Time: 09/08/14 11:12 AM  Result Value Ref Range Status   Specimen Description BLOOD RIGHT ANTECUBITAL  Final   Special Requests BOTTLES DRAWN AEROBIC AND ANAEROBIC 6CC  Final   Culture NO GROWTH 5 DAYS  Final   Report Status 09/13/2014 FINAL  Final  Blood culture (routine x 2)     Status: None   Collection Time: 09/08/14 11:20 AM  Result Value Ref Range Status   Specimen Description BLOOD RIGHT HAND  Final   Special Requests BOTTLES DRAWN AEROBIC AND ANAEROBIC 6CC  Final   Culture NO GROWTH 5 DAYS  Final   Report Status 09/13/2014 FINAL  Final  MRSA PCR Screening     Status: None   Collection Time: 09/08/14  3:30 PM  Result Value Ref Range Status   MRSA by PCR NEGATIVE NEGATIVE Final    Comment:        The GeneXpert MRSA Assay (FDA approved for NASAL specimens only), is one component of a comprehensive MRSA colonization surveillance program. It is not intended to diagnose MRSA infection nor to guide or monitor treatment for MRSA infections.      Studies: No results found.  Scheduled Meds: . sodium chloride   Intravenous Once  .  azithromycin  500 mg Oral Daily  . budesonide-formoterol  2 puff Inhalation BID  . cefTRIAXone (ROCEPHIN)  IV  1 g Intravenous Q24H  . diltiazem  180 mg Oral Daily  . enoxaparin (LOVENOX) injection  40 mg Subcutaneous Q24H  . fluticasone  2 spray Each Nare Daily  . guaiFENesin  1,200 mg Oral BID  . insulin aspart  0-20 Units Subcutaneous TID WC  . insulin aspart  0-5 Units Subcutaneous QHS  . insulin aspart  6 Units Subcutaneous TID WC  . levalbuterol  0.63 mg Nebulization Q6H  . methylPREDNISolone (SOLU-MEDROL) injection  60 mg Intravenous Q12H  . nicotine  21 mg Transdermal Daily  . pantoprazole  40 mg Oral Q1200  . sodium chloride  3 mL Intravenous Q12H  . tiotropium  18 mcg Inhalation  Daily   Continuous Infusions:    Principal Problem:   Acute on chronic respiratory failure Active Problems:   COPD (chronic obstructive pulmonary disease)   ASCVD (arteriosclerotic cardiovascular disease)   Type 2 diabetes mellitus without complication   Atrial fibrillation with rapid ventricular response   CAP (community acquired pneumonia)   Hyperglycemia   A-fib    Time spent: 25 minutes. Greater than 50% of this time was spent in direct contact with the patient coordinating care.    [facility] Pager [phone]  If 7PM-7AM, please contact night-coverage at [email or web address], password [facility] 09/13/2014, 11:24 AM  LOS: 5 days

## 2014-09-13 NOTE — Progress Notes (Signed)
Resting quietly, no respiratory distress at this time, no cough noted.

## 2014-09-13 NOTE — Progress Notes (Signed)
Inpatient Diabetes Program Recommendations  AACE/ADA: New Consensus Statement on Inpatient Glycemic Control (2013)  Target Ranges:  Prepandial:   less than 140 mg/dL      Peak postprandial:   less than 180 mg/dL (1-2 hours)      Critically ill patients:  140 - 180 mg/dL   Results for DRU, PRIMEAU (MRN [id number]) as of 09/13/2014 07:59  Ref. Range 09/12/2014 07:23 09/12/2014 11:53 09/12/2014 16:46 09/12/2014 20:49 09/13/2014 07:42  Glucose-Capillary Latest Range: 70-99 mg/dL 186 (H) 448 (H) 309 (H) 267 (H) 243 (H)   Diabetes history: DM2 Outpatient Diabetes medications: None Current orders for Inpatient glycemic control: Novolog 0-20 units TID with meals, Novolog 0-5 units HS, Novolog 6 units TID with meals  Inpatient Diabetes Program Recommendations Insulin - Basal: CBGs ranged from 186-448 mg/dl on 09/12/14 and fasting glucose this morning is 243 mg/dl. If steroids are continued, please consider ordering low dose basal insulin. Insulin - Meal Coverage: Noted Novolog 6 units TID meal coverage ordered 09/12/14 to start this morning. HgbA1C: A1C was 7.0% on 09/08/14. Patient has a history of DM but does not take any outpatient DM medications. May want to consider starting on oral DM medication at discharge or have patient follow up with PCP.  Thanks, Barnie Alderman, RN, MSN, CCRN, CDE Diabetes Coordinator Inpatient Diabetes Program [phone] (Team Pager) [phone] ([place] office) [phone] [facility] office)

## 2014-09-13 NOTE — Progress Notes (Signed)
Coughing blood-tinged sputum.  Tessalon Pearl given per orders.  Requested medication for sleep, given per orders.  Requested Maalox with ice cream for throat discomfort, given.

## 2014-09-14 ENCOUNTER — Inpatient Hospital Stay (HOSPITAL_COMMUNITY): Payer: Medicare Other

## 2014-09-14 ENCOUNTER — Encounter: Payer: Self-pay | Admitting: Physician Assistant

## 2014-09-14 LAB — GLUCOSE, CAPILLARY
GLUCOSE-CAPILLARY: 193 mg/dL — AB (ref 70–99)
GLUCOSE-CAPILLARY: 121 mg/dL — AB (ref 70–99)
GLUCOSE-CAPILLARY: 223 mg/dL — AB (ref 70–99)
Glucose-Capillary: 166 mg/dL — ABNORMAL HIGH (ref 70–99)
Glucose-Capillary: 304 mg/dL — ABNORMAL HIGH (ref 70–99)

## 2014-09-14 MED ORDER — PREDNISONE 20 MG PO TABS
60.0000 mg | ORAL_TABLET | Freq: Every day | ORAL | Status: DC
  Administered 2014-09-15: 60 mg via ORAL
  Filled 2014-09-14: qty 3

## 2014-09-14 MED ORDER — LORAZEPAM 0.5 MG PO TABS: 0.5000 mg | ORAL_TABLET | Freq: Three times a day (TID) | ORAL | Status: DC | PRN

## 2014-09-14 MED ORDER — INSULIN GLARGINE 100 UNIT/ML ~~LOC~~ SOLN
14.0000 [IU] | Freq: Every day | SUBCUTANEOUS | Status: DC
  Administered 2014-09-14: 14 [IU] via SUBCUTANEOUS
  Filled 2014-09-14 (×4): qty 0.14

## 2014-09-14 MED ORDER — FUROSEMIDE 20 MG PO TABS
20.0000 mg | ORAL_TABLET | Freq: Once | ORAL | Status: AC
  Administered 2014-09-14: 20 mg via ORAL
  Filled 2014-09-14: qty 1

## 2014-09-14 MED ORDER — LEVALBUTEROL HCL 0.63 MG/3ML IN NEBU
0.6300 mg | INHALATION_SOLUTION | Freq: Three times a day (TID) | RESPIRATORY_TRACT | Status: DC
  Administered 2014-09-14 – 2014-09-15 (×5): 0.63 mg via RESPIRATORY_TRACT
  Filled 2014-09-14 (×5): qty 3

## 2014-09-14 MED ORDER — METFORMIN HCL 500 MG PO TABS
500.0000 mg | ORAL_TABLET | Freq: Two times a day (BID) | ORAL | Status: DC
  Administered 2014-09-14 – 2014-09-15 (×2): 500 mg via ORAL
  Filled 2014-09-14 (×2): qty 1

## 2014-09-14 NOTE — Progress Notes (Signed)
Subjective: He says he hopes to go home tomorrow. He would be changed from his current albuterol to Xopenex and I will see if I can call his mail order pharmacy to get that arranged  Objective: Vital signs in last 24 hours: Temp:  [97.5 F (36.4 C)-97.7 F (36.5 C)] 97.5 F (36.4 C) (03/03 0624) Pulse Rate:  [70-77] 70 (03/03 0624) Resp:  [18] 18 (03/03 0624) BP: (118-135)/(68-77) 135/77 mmHg (03/03 0624) SpO2:  [91 %-96 %] 92 % (03/03 0749) Weight change:  Last BM Date: 09/13/14  Intake/Output from previous day: 03/02 0701 - 03/03 0700 In: 1440 [P.O.:1440] Out: 3100 [Urine:3100]  PHYSICAL EXAM General appearance: alert, cooperative and mild distress Resp: rhonchi bilaterally Cardio: irregularly irregular rhythm GI: soft, non-tender; bowel sounds normal; no masses,  no organomegaly Extremities: extremities normal, atraumatic, no cyanosis or edema  Lab Results:  Results for orders placed or performed during the hospital encounter of 09/08/14 (from the past 48 hour(s))  Glucose, capillary     Status: Abnormal   Collection Time: 09/12/14 11:53 AM  Result Value Ref Range   Glucose-Capillary 448 (H) 70 - 99 mg/dL   Comment 1 Notify RN   Glucose, capillary     Status: Abnormal   Collection Time: 09/12/14  4:46 PM  Result Value Ref Range   Glucose-Capillary 309 (H) 70 - 99 mg/dL   Comment 1 Notify RN   Glucose, capillary     Status: Abnormal   Collection Time: 09/12/14  8:49 PM  Result Value Ref Range   Glucose-Capillary 267 (H) 70 - 99 mg/dL   Comment 1 Notify RN   Basic metabolic panel     Status: Abnormal   Collection Time: 09/13/14  6:12 AM  Result Value Ref Range   Sodium 137 135 - 145 mmol/[name]   Potassium 4.9 3.5 - 5.1 mmol/[name]   Chloride 97 96 - 112 mmol/[name]   CO2 33 (H) 19 - 32 mmol/[name]   Glucose, Bld 257 (H) 70 - 99 mg/dL   BUN 21 6 - 23 mg/dL   Creatinine, Ser 1.02 0.50 - 1.35 mg/dL   Calcium 8.2 ([name]) 8.4 - 10.5 mg/dL   GFR calc non Af Amer 81 ([name]) >90 mL/min   GFR  calc Af Amer >90 >90 mL/min    Comment: (NOTE) The eGFR has been calculated using the CKD EPI equation. This calculation has not been validated in all clinical situations. eGFR's persistently <90 mL/min signify possible Chronic Kidney Disease.    Anion gap 7 5 - 15  CBC     Status: Abnormal   Collection Time: 09/13/14  6:12 AM  Result Value Ref Range   WBC 8.9 4.0 - 10.5 K/uL   RBC 4.74 4.22 - 5.81 MIL/uL   Hemoglobin 12.5 ([name]) 13.0 - 17.0 g/dL   HCT 38.7 ([name]) 39.0 - 52.0 %   MCV 81.6 78.0 - 100.0 fL   MCH 26.4 26.0 - 34.0 pg   MCHC 32.3 30.0 - 36.0 g/dL   RDW 14.2 11.5 - 15.5 %   Platelets 233 150 - 400 K/uL  Glucose, capillary     Status: Abnormal   Collection Time: 09/13/14  7:42 AM  Result Value Ref Range   Glucose-Capillary 243 (H) 70 - 99 mg/dL   Comment 1 Notify RN    Comment 2 Document in Chart   Glucose, capillary     Status: Abnormal   Collection Time: 09/13/14 11:12 AM  Result Value Ref Range   Glucose-Capillary  329 (H) 70 - 99 mg/dL   Comment 1 Notify RN    Comment 2 Document in Chart   Glucose, capillary     Status: Abnormal   Collection Time: 09/13/14  4:44 PM  Result Value Ref Range   Glucose-Capillary 171 (H) 70 - 99 mg/dL   Comment 1 Notify RN    Comment 2 Document in Chart   Glucose, capillary     Status: Abnormal   Collection Time: 09/14/14  7:34 AM  Result Value Ref Range   Glucose-Capillary 193 (H) 70 - 99 mg/dL   Comment 1 Notify RN    Comment 2 Document in Chart     ABGS No results for input(s): PHART, PO2ART, TCO2, HCO3 in the last 72 hours.  Invalid input(s): PCO2 CULTURES Recent Results (from the past 240 hour(s))  Blood culture (routine x 2)     Status: None   Collection Time: 09/08/14 11:12 AM  Result Value Ref Range Status   Specimen Description BLOOD RIGHT ANTECUBITAL  Final   Special Requests BOTTLES DRAWN AEROBIC AND ANAEROBIC 6CC  Final   Culture NO GROWTH 5 DAYS  Final   Report Status 09/13/2014 FINAL  Final  Blood culture  (routine x 2)     Status: None   Collection Time: 09/08/14 11:20 AM  Result Value Ref Range Status   Specimen Description BLOOD RIGHT HAND  Final   Special Requests BOTTLES DRAWN AEROBIC AND ANAEROBIC 6CC  Final   Culture NO GROWTH 5 DAYS  Final   Report Status 09/13/2014 FINAL  Final  MRSA PCR Screening     Status: None   Collection Time: 09/08/14  3:30 PM  Result Value Ref Range Status   MRSA by PCR NEGATIVE NEGATIVE Final    Comment:        The GeneXpert MRSA Assay (FDA approved for NASAL specimens only), is one component of a comprehensive MRSA colonization surveillance program. It is not intended to diagnose MRSA infection nor to guide or monitor treatment for MRSA infections.    Studies/Results: No results found.  Medications:  Prior to Admission:  Prescriptions prior to admission  Medication Sig Dispense Refill Last Dose  . albuterol (PROVENTIL) (2.5 MG/3ML) 0.083% nebulizer solution Take 2.5 mg by nebulization every 6 (six) hours as needed for wheezing or shortness of breath.   09/07/2014 at Unknown time  . budesonide-formoterol (SYMBICORT) 80-4.5 MCG/ACT inhaler Inhale 2 puffs into the lungs 2 (two) times daily. 1 Inhaler 12 09/07/2014 at Unknown time  . diltiazem (CARDIZEM CD) 180 MG 24 hr capsule Take 180 mg by mouth daily.  0 09/07/2014 at Unknown time  . fluticasone (FLONASE) 50 MCG/ACT nasal spray Place 2 sprays into both nostrils daily.   09/07/2014 at Unknown time  . Fluticasone-Salmeterol (ADVAIR DISKUS) 250-50 MCG/DOSE AEPB Inhale 1 puff into the lungs every 12 (twelve) hours.    09/07/2014 at Unknown time  . tiotropium (SPIRIVA) 18 MCG inhalation capsule Place 18 mcg into inhaler and inhale daily.   09/07/2014 at Unknown time  . albuterol (VENTOLIN HFA) 108 (90 BASE) MCG/ACT inhaler Inhale 2 puffs into the lungs every 4 (four) hours as needed. Shortness of breath   unknown  . diltiazem (CARDIZEM CD) 120 MG 24 hr capsule Take 1 capsule (120 mg total) by mouth daily.  (Patient not taking: Reported on 09/08/2014) 90 capsule 3   . edoxaban (SAVAYSA) 60 MG TABS tablet Take 60 mg by mouth daily. 30 tablet 3 8 days  . nitroGLYCERIN (  NITROSTAT) 0.4 MG SL tablet Place 1 tablet (0.4 mg total) under the tongue every 5 (five) minutes as needed for chest pain. 25 tablet 4 unknown   Scheduled: . azithromycin  500 mg Oral Daily  . budesonide-formoterol  2 puff Inhalation BID  . cefTRIAXone (ROCEPHIN)  IV  1 g Intravenous Q24H  . diltiazem  180 mg Oral Daily  . enoxaparin (LOVENOX) injection  40 mg Subcutaneous Q24H  . fluticasone  2 spray Each Nare Daily  . guaiFENesin  1,200 mg Oral BID  . insulin aspart  0-20 Units Subcutaneous TID WC  . insulin aspart  0-5 Units Subcutaneous QHS  . insulin aspart  6 Units Subcutaneous TID WC  . insulin glargine  12 Units Subcutaneous QHS  . levalbuterol  0.63 mg Nebulization TID  . methylPREDNISolone (SOLU-MEDROL) injection  40 mg Intravenous Q12H  . nicotine  21 mg Transdermal Daily  . pantoprazole  40 mg Oral Q1200  . sodium chloride  3 mL Intravenous Q12H  . tiotropium  18 mcg Inhalation Daily   Continuous:  CBS:WHQPRF chloride, acetaminophen **OR** acetaminophen, alum & mag hydroxide-simeth, benzonatate, levalbuterol, LORazepam, morphine injection, nitroGLYCERIN, ondansetron **OR** ondansetron (ZOFRAN) IV, oxyCODONE, senna-docusate, sodium chloride, traZODone  Assesment: He is admitted with acute on chronic respiratory failure from COPD and community-acquired pneumonia. In addition to that he has atrial fibrillation. He is going to be switched to Xopenex Principal Problem:   Acute on chronic respiratory failure Active Problems:   TOBACCO ABUSE   COPD (chronic obstructive pulmonary disease)   ASCVD (arteriosclerotic cardiovascular disease)   Type 2 diabetes mellitus without complication   Atrial fibrillation with rapid ventricular response   CAP (community acquired pneumonia)   Hemoptysis   Hyperglycemia    A-fib    Plan: He says he thinks he'll be discharged tomorrow I will see if I can arrange his new medication.    LOS: 6 days   Ryan [name] 09/14/2014, 8:48 AM

## 2014-09-14 NOTE — Progress Notes (Signed)
Inpatient Diabetes Program Recommendations  AACE/ADA: New Consensus Statement on Inpatient Glycemic Control (2013)  Target Ranges:  Prepandial:   less than 140 mg/dL      Peak postprandial:   less than 180 mg/dL (1-2 hours)      Critically ill patients:  140 - 180 mg/dL   Results for Ryan [name], Ryan [name] (MRN [id number]) as of 09/14/2014 12:02  Ref. Range 09/13/2014 07:42 09/13/2014 11:12 09/13/2014 16:44 09/14/2014 07:34 09/14/2014 11:19  Glucose-Capillary Latest Range: 70-99 mg/dL 243 (H) 329 (H) 171 (H) 193 (H) 304 (H)   Diabetes history: DM2 Outpatient Diabetes medications: None  Current orders for Inpatient glycemic control: Lantus 12 units QHS, Novolog 0-20 units TID with meals, Novolog 0-5  units HS, Novolog 6 units TID with meals for meal coverage  Inpatient Diabetes Program Recommendations Insulin - Basal: Please consider increasing Lantus to 14 units QHS. Insulin - Meal Coverage: If steroids will be continued please consider increasing meal coverage to Novolog 10 units TID with meals. HgbA1C: A1C was 7.0% on 09/08/14. Patient has a history of DM but does not take any outpatient DM medications. May want to consider starting on oral DM medication at discharge or have patient follow up with PCP.  Thanks, Barnie Alderman, RN, MSN, CCRN, CDE Diabetes Coordinator Inpatient Diabetes Program [phone] (Team Pager) [phone] ([place] office) [phone] [facility] office)

## 2014-09-14 NOTE — Progress Notes (Signed)
[facility] PROGRESS NOTE  Ryan [name] LKG:[id number] DOB: [birth year] DOA: 09/08/2014 PCP: Dena Billet BETH, PA-C  Assessment/Plan: Acute Chronic Hypoxemic Respiratory Failure -Related to CAP in the setting of COPD and what appears to be interstitial lung disease. -Continue oxygen therapy titrated to keep his oxygen saturations at a minimum of 89%. -Will decrease steroids as he has no significant wheezing on exam; we'll change Solu-Medrol to oral prednisone. -Appreciate pulmonary assistance. -Has a component of anxiety it seems.   CAP -Continue rocephin/azithro. -Strep pneumo antigen and Legionella antigen were negative. -Blood culture 2 negative to date. Respiratory virus panel pending. -He is afebrile and his white blood cell count has trended downward toward normal limits.  COPD with Acute Exacerbation -He has no significant wheezes. He is clinically improving. -We'll change steroid treatment to prednisone and discontinue Solu-Medrol. -Continue Symbicort and Xopenex nebulizer and Spiriva. Continue oxygen supplementation.  Mild hemoptysis, per history given. -Likely secondary to a pneumonia/COPD or possibly bronchiectasis as per Dr. Luan Pulling. -Hemoglobin/hematocrit more or less stable. -Anticoagulant is being held, but given marginal hemoptysis, will restart it today and monitor.  Tobacco abuse. -He was advised to stop smoking. We'll continue nicotine replacement therapy.  A Fib with RVR -Has chronic a fib. -Initially, his rapid rates were likely related to COPD exacerbation and CAP. -Cardizem drip started and weaned off 2/27. -Oral diltiazem restarted on 09/13/14. -He was on saveysa which he self-discontinued due to hemoptysis. The hemoptysis has all but stopped. -Will restart it today and monitor. If his hemoptysis returns or worsens, will discontinue it again and will need to discuss further with cardiology. -His heart rate did increase with activity, but are  now mostly controlled.  DM, type II -Patient is diet-controlled at home, but will use metformin when necessary for elevated blood sugars. -His hemoglobin A1c was 7.0 on 09/08/14. -Has had elevated CBGs, likely associated with steroid use. -We'll continue sliding scale NovoLog and mealtime coverage; Lantus added on 3/2, but will increase it to 14 units daily at bedtime. -We'll start metformin which she takes when necessary at home. -Expect CBGs to improve with discontinuation of IV site Medrol.  Chronic anxiousness. We'll continue lorazepam, but will change it to by mouth when necessary rather than IV.  Subacute left hip pain. -The patient complains of subacute left hip pain, status post a fall at home. -Pelvic/left hip x-ray ordered and was nonacute. -Continue analgesics as needed.  Mild peripheral edema. -Likely secondary to IV steroids. -No evidence of compensated heart failure. -We'll give one oral dose of Lasix.  Code Status: Full Code Family Communication: Discussed with family on 09/13/14  Disposition Plan: Discharge when clinically appropriate  Consultants:  Pulmonary, Dr. Luan Pulling   Antibiotics:  Rocephin  Azithro   Subjective: Patient complains of left hip pain which started a couple weeks ago, status post fall at home. He also has some swelling in his hands and his lower legs. He says he is breathing better.  Objective: Filed Vitals:   09/13/14 1952 09/13/14 2125 09/14/14 0624 09/14/14 0749  BP:  118/73 135/77   Pulse:  77 70   Temp:  97.7 F (36.5 C) 97.5 F (36.4 C)   TempSrc:  Oral Oral   Resp:  18 18   Height:      Weight:      SpO2: 96% 94% 95% 92%    Intake/Output Summary (Last 24 hours) at 09/14/14 1436 Last data filed at 09/14/14 1327  Gross per 24 hour  Intake  1010 ml  Output   2550 ml  Net  -1540 ml   Filed Weights   09/10/14 0500 09/11/14 0500 09/12/14 0500  Weight: 64 kg (141 lb 1.5 oz) 65 kg (143 lb 4.8 oz) 66.2 kg (145 lb 15.1 oz)     Exam:   General:  54 year old Caucasian man sitting up in bed, in no acute distress.  Cardiovascular: irregular, irregular  Respiratory: coarse breath sounds , but with no audible wheezes or crackles per my gram today; breathing nonlabored.  Abdomen: Positive bowel sounds, soft, nontender, nondistended.  Extremities: Trace of pedal edema. Mild tenderness over the left hip joint, but without edema, effusion, or warmth; good range of motion of his left hip, but with some discomfort.   Neurologic:  Alert and oriented 3.  Data Reviewed: Basic Metabolic Panel:  Recent Labs Lab 09/08/14 0942 09/09/14 0542 09/10/14 0528 09/13/14 0612  NA 135 135 135 137  K 3.8 4.4 5.0 4.9  CL 103 104 103 97  CO2 25 23 24  33*  GLUCOSE 178* 239* 142* 257*  BUN 10 19 28* 21  CREATININE 0.94 1.32 0.99 1.02  CALCIUM 8.2* 8.2* 8.6 8.2*   Liver Function Tests:  Recent Labs Lab 09/08/14 0942  AST 19  ALT 16  ALKPHOS 65  BILITOT 0.3  PROT 7.1  ALBUMIN 3.1*   No results for input(s): LIPASE, AMYLASE in the last 168 hours. No results for input(s): AMMONIA in the last 168 hours. CBC:  Recent Labs Lab 09/08/14 0942 09/09/14 0542 09/10/14 0528 09/11/14 0431 09/12/14 0405 09/13/14 0612  WBC 7.7 10.3 18.2* 10.0 12.5* 8.9  NEUTROABS 5.1  --   --   --   --   --   HGB 13.7 11.2* 12.5* 12.5* 13.4 12.5*  HCT 41.7 35.0* 38.3* 38.7* 41.7 38.7*  MCV 81.0 81.8 82.0 81.5 82.7 81.6  PLT 251 218 263 247 282 233   Cardiac Enzymes: No results for input(s): CKTOTAL, CKMB, CKMBINDEX, TROPONINI in the last 168 hours. BNP (last 3 results) No results for input(s): BNP in the last 8760 hours.  ProBNP (last 3 results)  Recent Labs  10/31/13 0152  PROBNP 359.3*    CBG:  Recent Labs Lab 09/13/14 1112 09/13/14 1644 09/13/14 2122 09/14/14 0734 09/14/14 1119  GLUCAP 329* 171* 166* 193* 304*    Recent Results (from the past 240 hour(s))  Blood culture (routine x 2)     Status: None    Collection Time: 09/08/14 11:12 AM  Result Value Ref Range Status   Specimen Description BLOOD RIGHT ANTECUBITAL  Final   Special Requests BOTTLES DRAWN AEROBIC AND ANAEROBIC 6CC  Final   Culture NO GROWTH 5 DAYS  Final   Report Status 09/13/2014 FINAL  Final  Blood culture (routine x 2)     Status: None   Collection Time: 09/08/14 11:20 AM  Result Value Ref Range Status   Specimen Description BLOOD RIGHT HAND  Final   Special Requests BOTTLES DRAWN AEROBIC AND ANAEROBIC 6CC  Final   Culture NO GROWTH 5 DAYS  Final   Report Status 09/13/2014 FINAL  Final  MRSA PCR Screening     Status: None   Collection Time: 09/08/14  3:30 PM  Result Value Ref Range Status   MRSA by PCR NEGATIVE NEGATIVE Final    Comment:        The GeneXpert MRSA Assay (FDA approved for NASAL specimens only), is one component of a comprehensive MRSA colonization surveillance program. It is  not intended to diagnose MRSA infection nor to guide or monitor treatment for MRSA infections.      Studies: Dg Hip Unilat With Pelvis 2-3 Views Left  09/14/2014   CLINICAL DATA:  Lt sided hip pain anterior and lateral. He fell at home last Friday and his oxygen machine hit his hip  EXAM: LEFT HIP (WITH PELVIS) 2-3 VIEWS  COMPARISON:  None.  FINDINGS: No fracture or bone lesion. Hip joints are normally spaced and aligned with no significant arthropathic change. Bony pelvis is intact. Mild edema is seen in the subcutaneous soft tissues lateral to the left hip.  IMPRESSION: No fracture or dislocation.   Electronically Signed   By: Lajean Manes M.D.   On: 09/14/2014 14:17    Scheduled Meds: . azithromycin  500 mg Oral Daily  . budesonide-formoterol  2 puff Inhalation BID  . cefTRIAXone (ROCEPHIN)  IV  1 g Intravenous Q24H  . diltiazem  180 mg Oral Daily  . enoxaparin (LOVENOX) injection  40 mg Subcutaneous Q24H  . fluticasone  2 spray Each Nare Daily  . guaiFENesin  1,200 mg Oral BID  . insulin aspart  0-20 Units  Subcutaneous TID WC  . insulin aspart  0-5 Units Subcutaneous QHS  . insulin aspart  6 Units Subcutaneous TID WC  . insulin glargine  12 Units Subcutaneous QHS  . levalbuterol  0.63 mg Nebulization TID  . methylPREDNISolone (SOLU-MEDROL) injection  40 mg Intravenous Q12H  . nicotine  21 mg Transdermal Daily  . pantoprazole  40 mg Oral Q1200  . sodium chloride  3 mL Intravenous Q12H  . tiotropium  18 mcg Inhalation Daily   Continuous Infusions:    Principal Problem:   Acute on chronic respiratory failure Active Problems:   TOBACCO ABUSE   COPD (chronic obstructive pulmonary disease)   ASCVD (arteriosclerotic cardiovascular disease)   Type 2 diabetes mellitus without complication   Atrial fibrillation with rapid ventricular response   CAP (community acquired pneumonia)   Hemoptysis   Hyperglycemia   A-fib    Time spent: 25 minutes. Greater than 50% of this time was spent in direct contact with the patient coordinating care.    [facility] Pager [phone]  If 7PM-7AM, please contact night-coverage at [email or web address], password [facility] 09/14/2014, 2:36 PM  LOS: 6 days

## 2014-09-15 DIAGNOSIS — J441 Chronic obstructive pulmonary disease with (acute) exacerbation: Secondary | ICD-10-CM

## 2014-09-15 LAB — RESPIRATORY VIRUS PANEL

## 2014-09-15 LAB — CREATININE, SERUM
CREATININE: 1.06 mg/dL (ref 0.50–1.35)
GFR calc non Af Amer: 77 mL/min — ABNORMAL LOW (ref >90)
GFR, EST AFRICAN AMERICAN: 89 mL/min — AB (ref >90)

## 2014-09-15 LAB — GLUCOSE, CAPILLARY
GLUCOSE-CAPILLARY: 117 mg/dL — AB (ref 70–99)
Glucose-Capillary: 226 mg/dL — ABNORMAL HIGH (ref 70–99)

## 2014-09-15 IMAGING — CT CT CHEST W/O CM
3 of 4 series · 16 of 30 positions shown, 18 images · non-contrast
Comparison: Chest CT 12/21/2012.

CLINICAL DATA: Cough and mild chest pain. History of prior cavitary
lung lesion. Prior biopsy demonstrated mycobacterial infection.

EXAM:
CT CHEST WITHOUT CONTRAST
TECHNIQUE: Multidetector CT imaging of the chest was performed following the
standard protocol without IV contrast.

[Series 3: chest w/o · axial · non-contrast · 0.70mm/px · z∈[-256,-36]mm · 5 of 68 slices shown, 7 images]
[im 12/68  mediastinal]
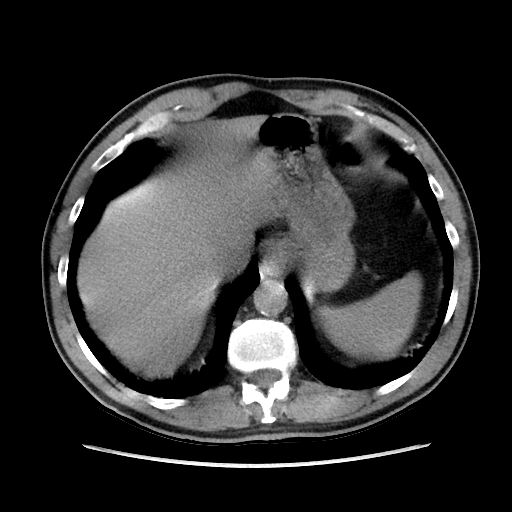
[im 12/68  lung]
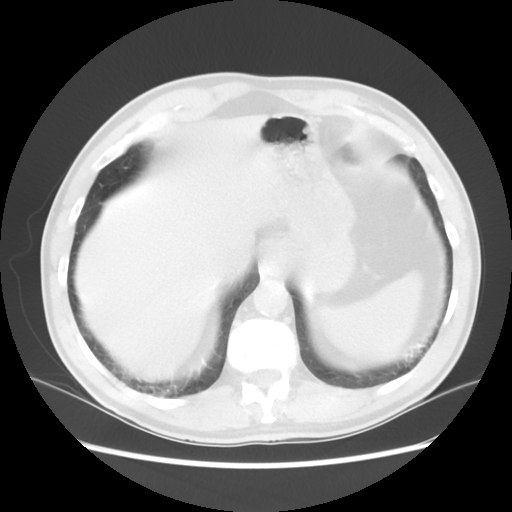
[im 23/68  lung]
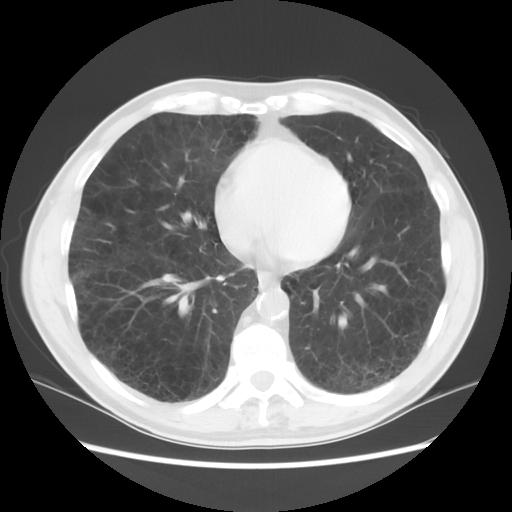
[im 34/68  lung]
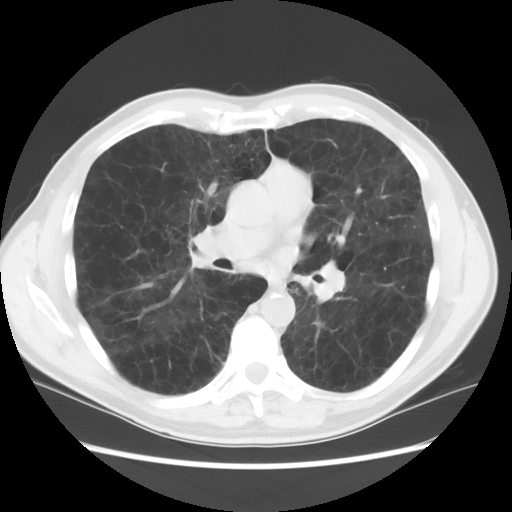
[im 45/68  lung]
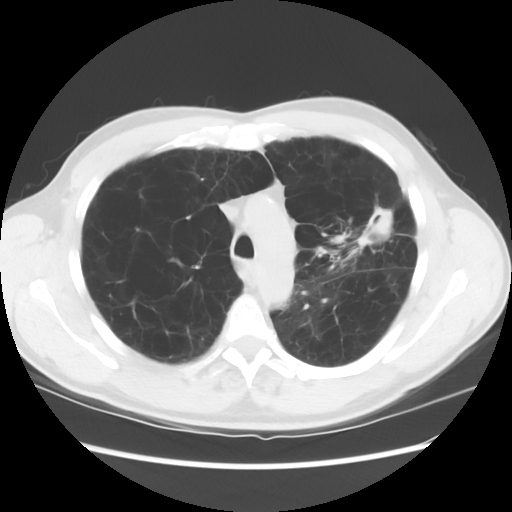
[im 56/68  mediastinal]
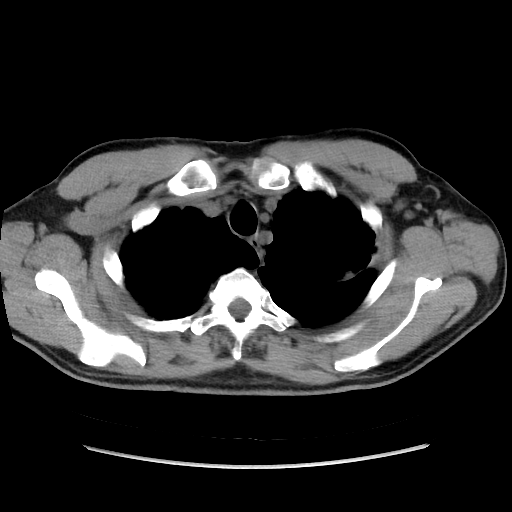
[im 56/68  lung]
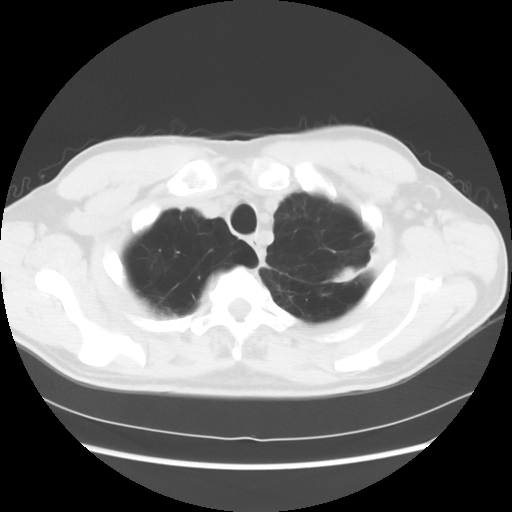

[Series 4: lung windows · axial · 0.70mm/px · z∈[-256,-36]mm · 5 of 68 slices shown]
[im 12/68  lung]
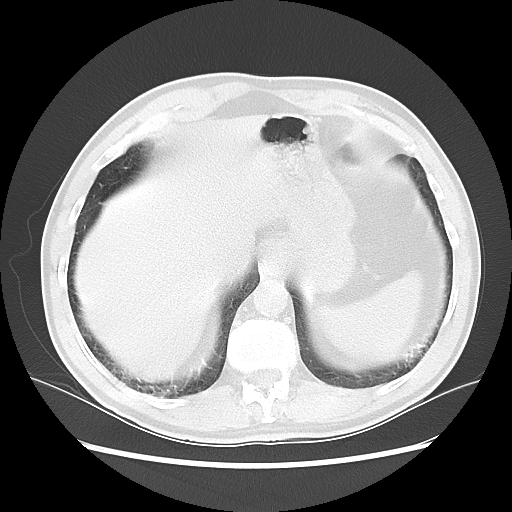
[im 23/68  lung]
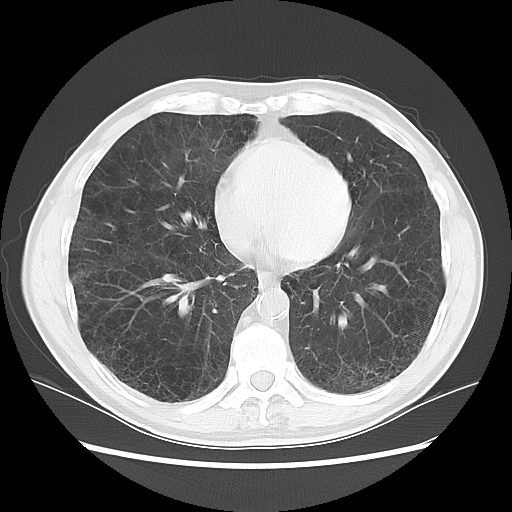
[im 34/68  lung]
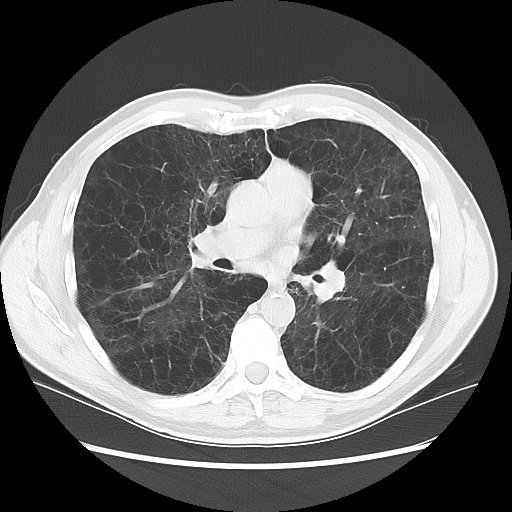
[im 45/68  lung]
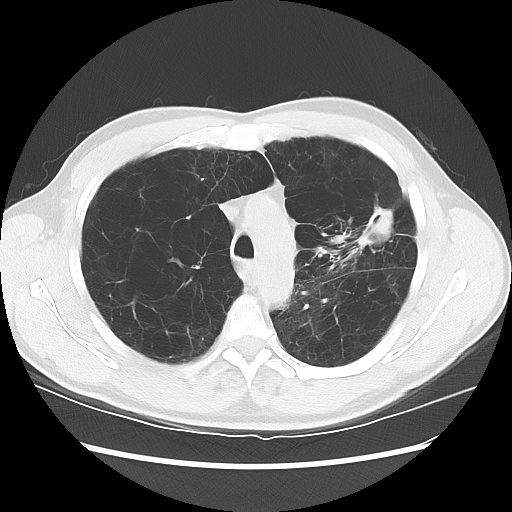
[im 56/68  lung]
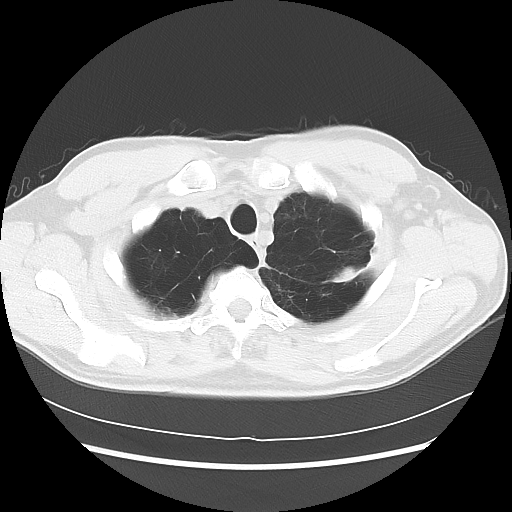

[Series 602: sagittal body · sagittal · 0.70mm/px · 6 of 145 slices shown]
[im 12/145  mediastinal]
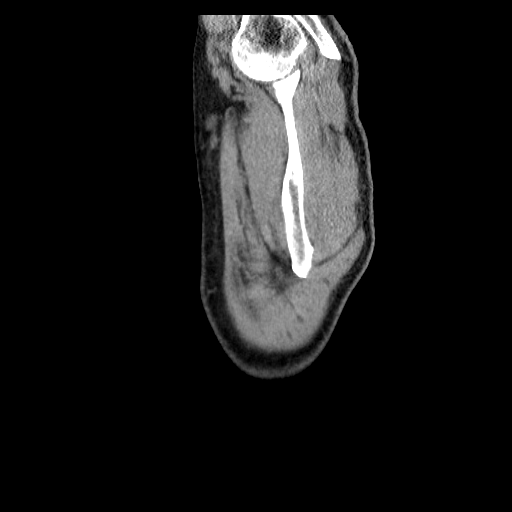
[im 34/145  mediastinal]
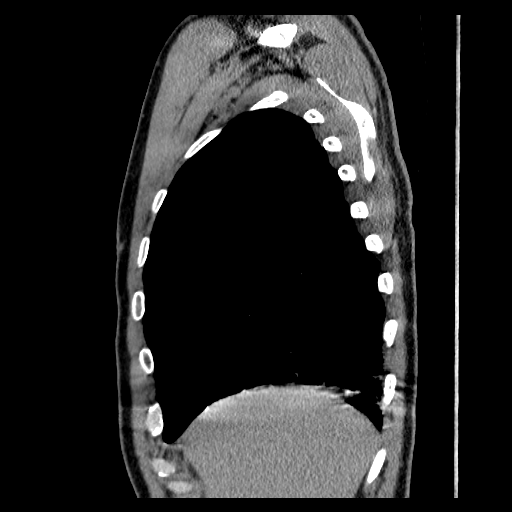
[im 45/145  mediastinal]
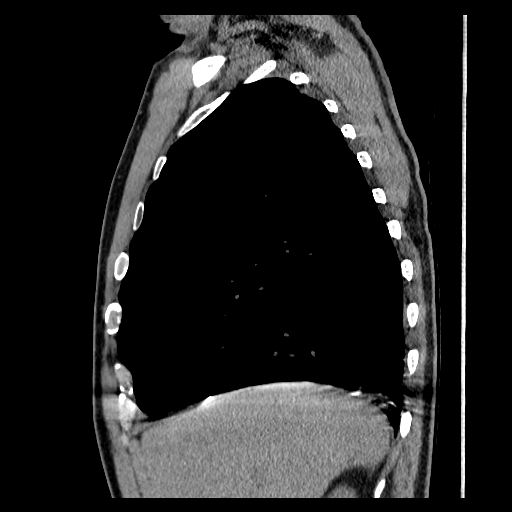
[im 67/145  mediastinal]
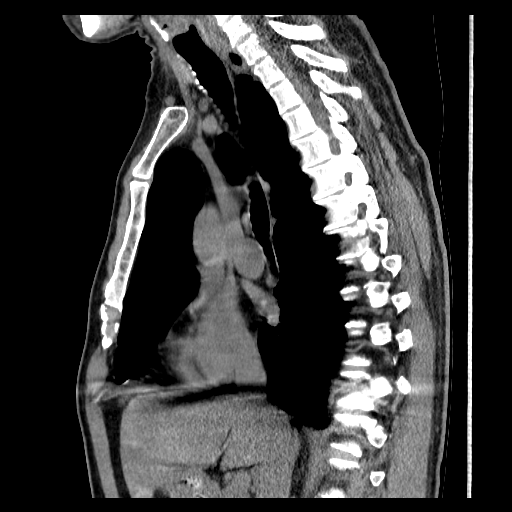
[im 78/145  mediastinal]
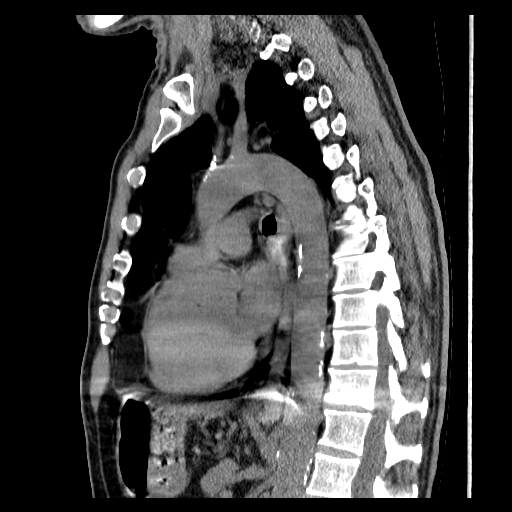
[im 100/145  mediastinal]
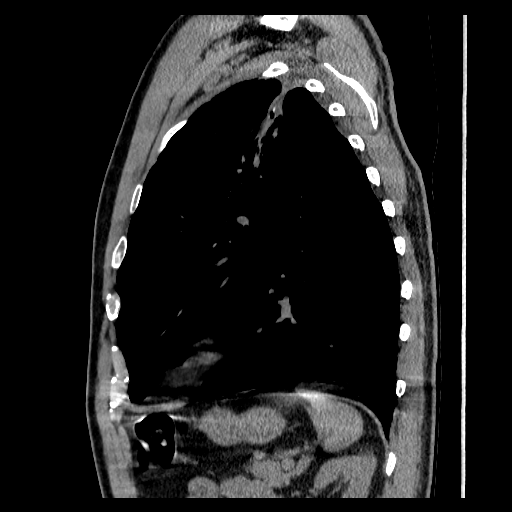

[16 of 30 positions shown; findings below may reference images not displayed]

FINDINGS: Mediastinum: Heart size is normal. There is no significant
pericardial fluid, thickening or pericardial calcification. There is
atherosclerosis of the thoracic aorta, the great vessels of the
mediastinum and the coronary arteries, including calcified
atherosclerotic plaque in the left main, left anterior descending,
left circumflex and right coronary arteries. No pathologically
enlarged mediastinal or hilar lymph nodes. Please note that accurate
exclusion of hilar adenopathy is limited on noncontrast CT scans.
Small hiatal hernia.

Lungs/Pleura: As with numerous prior examinations there is a complex
thick-walled cavitary lesion in the periphery of the left upper
lobe. This appears very similar to the prior examinations, including
several areas of mural nodularity. There is extensive surrounding
architectural distortion and some adjacent cylindrical
bronchiectasis, as well as overlying pleural retraction. There is a
background of severe centrilobular and mild paraseptal emphysema.
Areas of septal thickening are noted in the dependent portions of
the lung bases bilaterally. Previously noted lingular nodule is
similar to prior studies measuring 7 mm on today's examination (this
is unchanged compared to prior study 09/07/2012). No acute
consolidative airspace disease. No pleural effusions.

Upper Abdomen: Unremarkable.

Musculoskeletal: There are no aggressive appearing lytic or blastic
lesions noted in the visualized portions of the skeleton.
IMPRESSION: 1. No significant change in thick-walled cavitary lesion in the left
upper lobe compared in numerous prior examinations.
2. Unchanged lingular nodule. Although this nodule was previously
reported as 5 mm on study 12/21/2012, the difference in size on
today's examination is apparently related to slice selection, as
this nodule measured 7 mm on more remote prior study 09/07/2012, and
is considered unchanged on today's examination.
3. Atherosclerosis, including left main and 3 vessel coronary artery
disease. Please note that although the presence of coronary artery
calcium documents the presence of coronary artery disease, the
severity of this disease and any potential stenosis cannot be
assessed on this non-gated CT examination. Assessment for potential
risk factor modification, dietary therapy or pharmacologic therapy
may be warranted, if clinically indicated.
4. Severe emphysema redemonstrated.

## 2014-09-15 MED ORDER — METFORMIN HCL 500 MG PO TABS: 500.0000 mg | ORAL_TABLET | Freq: Every day | ORAL | Status: AC

## 2014-09-15 MED ORDER — PREDNISONE 20 MG PO TABS: 20.0000 mg | ORAL_TABLET | Freq: Every day | ORAL | Status: DC

## 2014-09-15 MED ORDER — BENZONATATE 100 MG PO CAPS: 100.0000 mg | ORAL_CAPSULE | Freq: Three times a day (TID) | ORAL | Status: DC | PRN

## 2014-09-15 MED ORDER — LEVALBUTEROL HCL 0.63 MG/3ML IN NEBU: 0.6300 mg | INHALATION_SOLUTION | Freq: Three times a day (TID) | RESPIRATORY_TRACT | Status: DC

## 2014-09-15 MED ORDER — LORAZEPAM 0.5 MG PO TABS: 0.5000 mg | ORAL_TABLET | Freq: Two times a day (BID) | ORAL | Status: DC | PRN

## 2014-09-15 MED ORDER — OXYCODONE-ACETAMINOPHEN 5-325 MG PO TABS: 1.0000 | ORAL_TABLET | ORAL | Status: DC | PRN

## 2014-09-15 NOTE — Progress Notes (Signed)
1519 D/C instructions and hard Rxs given to patient prior to d/c home. IV catheter removed from RIGHT forearm, catheter tip intact, no s/s of infection noted. Wife at bedside during d/c instructions and both confirmed awareness of patient's f/u appointments. O2 SAT 92% 2L. Wife brought patient's home O2 tank for patient to transfer home with.

## 2014-09-15 NOTE — Progress Notes (Signed)
He hopes to be discharged later today. He had been receiving his nebulizer medications from a mail order pharmacy but his insurance has changed and he will need to get it from his local drug store now. I will follow as an outpatient. Thanks for allowing me to see him with you

## 2014-09-15 NOTE — Progress Notes (Signed)
Inpatient Diabetes Program Recommendations  AACE/ADA: New Consensus Statement on Inpatient Glycemic Control (2013)  Target Ranges:  Prepandial:   less than 140 mg/dL      Peak postprandial:   less than 180 mg/dL (1-2 hours)      Critically ill patients:  140 - 180 mg/dL   Results for Ryan [name], Ryan [name] (MRN [id number]) as of 09/15/2014 12:42  Ref. Range 09/15/2014 07:52 09/15/2014 11:31  Glucose-Capillary Latest Range: 70-99 mg/dL 117 (H) 226 (H)    Diabetes history: DM2 Outpatient Diabetes medications: None  Current orders for Inpatient glycemic control: Lantus 14 units QHS, Novolog 0-20 units TID with meals, Novolog 0-5 units HS, Novolog 6 units TID with meals for meal coverage  Inpatient Diabetes Program Recommendations  Insulin - Meal Coverage: If patient is not discharged today, and if steroids are continued, please consider increasing meal coverage to Novolog 10 units TID with meals.  HgbA1C: A1C was 7.0% on 09/08/14. Patient has a history of DM but does not take any outpatient DM medications. May want to consider starting on oral DM medication at discharge or have patient follow up with PCP.  Thanks,  Tama Headings RN, MSN, [facility] Inpatient Diabetes Coordinator Team Pager [phone]

## 2014-09-15 NOTE — Discharge Summary (Signed)
Physician Discharge Summary  Ryan [name] PXT:[id number] DOB: 1961/01/23 DOA: 09/08/2014  PCP: [name], PA-C  Admit date: 09/08/2014 Discharge date: 09/15/2014  Time spent: Greater than 30 minutes  Recommendations for Outpatient Follow-up:  1.     Discharge Diagnoses:   1. Acute on chronic respiratory failure with hypoxia secondary to community-acquired pneumonia and COPD exacerbation. 2. Oxygen dependent COPD with exacerbation. 3. Community-acquired pneumonia. 4. Hemoptysis, mild. 4. Chronic paroxysmal atrial fibrillation, with RVR. On chronic anticoagulation with Savaysa 5. Type 2 diabetes mellitus. Hemoglobin A1c was 7.0. 6. Tobacco abuse. The patient was advised to stop smoking. 7. Atherosclerotic cardiovascular disease.  Discharge Condition: Improved.  Diet recommendation: Heart healthy, carbohydrate modified.  Filed Weights   09/10/14 0500 09/11/14 0500 09/12/14 0500  Weight: 64 kg (141 lb 1.5 oz) 65 kg (143 lb 4.8 oz) 66.2 kg (145 lb 15.1 oz)    History of present illness:  The patient is a 54 year old man with a history of oxygen dependent COPD, atherosclerotic cardiovascular disease, and type 2 diabetes mellitus, who presented to the emergency department on 09/08/14 with a chief complaint of progressive worsening shortness of breath. In the ED, he was oxygenating 90-91% on supplemental oxygen. He was tachycardic with a heart rate ranging from 100-180. He was afebrile. His chest x-ray revealed infiltrate in the left upper lobe and underlying emphysema with areas of scarring and bibasilar interstitial fibrosis. His EKG revealed atrial fibrillation with a heart rate of 181 bpm. His lab data revealed a normal ABC, normal lactic acid level, and glucose of 178. He was admitted for further evaluation and management.   Hospital Course:  1. Acute on chronic respiratory failure with hypoxia. The patient was continued on oxygen, but it was titrated to keep his oxygen  saturations at a minimum of 89%. He was treated for COPD exacerbation and pneumonia. Following treatment, he was oxygenating 96% on 2 L of oxygen, likely his baseline. Community acquired pneumonia. The patient was started on azithromycin and Rocephin. Supportive treatment was given. For further evaluation, number studies were ordered. His blood cultures remain negative to date. Strep pneumo antigen and Legionella antigen were negative. Respiratory viral panel was negative. He remained afebrile and his white blood cell count was within normal limits at discharge. He completed a one-week course of IV antibiotics during the hospital course. Oxygen dependent COPD with acute exacerbation. The patient was started on treatment as above. In addition, IV Solu-Medrol was given. Bronchodilator therapy was started with Xopenex rather than albuterol because of his rapid atrial fibrillation. Pulmonologist, Dr. Luan Pulling was consulted and agreed with medical management. The patient improved clinically and symptomatically. He was discharged on a prednisone taper and continue bronchodilator therapy. He is hoping to be able to acquire Xopenex nebulizer rather than albuterol because of the change in his insurance. Dr. Luan Pulling stated that he was willing to follow the patient for COPD. Patient was informed of this and was instructed to follow-up with him. Mild hemoptysis. This was reported by the patient. He stated that his bloody sputum started prior to the hospitalization and had occurred a couple times during hospitalization. He had stopped his anticoagulant, Savaysa. It was withheld during hospitalization, but it was restarted upon discharge when the hemoptysis apparently stopped. His hemoglobin fell slightly, but was likely secondary to mild hemodilution rather than blood loss. The patient was advised to stop smoking. Paroxysmal atrial fibrillation with RVR. The patient was in rapid atrial fibrillation with a heart  rate in the 180s following  admission. This is exacerbated by COPD exacerbation, CAP, and bronchodilators. He was started on a diltiazem drip. It was titrated off when his heart rate improved. He was restarted on oral diltiazem. His anticoagulant was withheld due to hemoptysis, but was restarted upon discharge when his hemoptysis, which was mild, stopped. His heart rate was controlled at the time of discharge. Type 2 diabetes mellitus. The patient is treated chronically with when necessary metformin. His capillary blood glucose increased on IV Solu-Medrol. This was treated with sliding scale NovoLog and Lantus. As these IV steroids were titrated down, his capillary blood glucose improved. His hemoglobin A1c was 7.0. He was instructed to take metformin daily rather than when necessary at the time of discharge.  Procedures:  None   Consultations:  Pulmonologist, Dr. Luan Pulling   Discharge Exam: Filed Vitals:   09/15/14 0608  BP: 119/84  Pulse: 64  Temp: 97.7 F (36.5 C)  Resp: 18   oxygen saturation 96% on 2 L nasal cannula oxygen.  General: 54 year old Caucasian man sitting up in bed, in no acute distress.  Cardiovascular: irregular, irregular  Respiratory: coarse breath sounds , but with no audible wheezes or crackles per my gram today; breathing nonlabored.  Abdomen: Positive bowel sounds, soft, nontender, nondistended.  Extremities: No pedal edema.   Neurologic: Alert and oriented 3.  Discharge Instructions   Discharge Instructions    Diet - low sodium heart healthy    Complete by:  As directed      Discharge instructions    Complete by:  As directed   Take medications as directed. Do not smoke.     Increase activity slowly    Complete by:  As directed           Current Discharge Medication List    START taking these medications   Details  benzonatate (TESSALON) 100 MG capsule Take 1 capsule (100 mg total) by mouth 3 (three) times daily as needed for  cough. Qty: 20 capsule, Refills: 0    levalbuterol (XOPENEX) 0.63 MG/3ML nebulizer solution Take 3 mLs (0.63 mg total) by nebulization 3 (three) times daily. Qty: 3 mL, Refills: 12    LORazepam (ATIVAN) 0.5 MG tablet Take 1 tablet (0.5 mg total) by mouth 2 (two) times daily as needed for anxiety or sleep. Qty: 20 tablet, Refills: 0    metFORMIN (GLUCOPHAGE) 500 MG tablet Take 1 tablet (500 mg total) by mouth daily with breakfast. Qty: 30 tablet, Refills: 3    oxyCODONE-acetaminophen (ROXICET) 5-325 MG per tablet Take 1 tablet by mouth every 4 (four) hours as needed for severe pain. Qty: 25 tablet, Refills: 0    predniSONE (DELTASONE) 20 MG tablet Take 1 tablet (20 mg total) by mouth daily with breakfast. Starting tomorrow, take for 4 more days. Qty: 4 tablet, Refills: 0      CONTINUE these medications which have NOT CHANGED   Details  budesonide-formoterol (SYMBICORT) 80-4.5 MCG/ACT inhaler Inhale 2 puffs into the lungs 2 (two) times daily. Qty: 1 Inhaler, Refills: 12    diltiazem (CARDIZEM CD) 180 MG 24 hr capsule Take 180 mg by mouth daily. Refills: 0    fluticasone (FLONASE) 50 MCG/ACT nasal spray Place 2 sprays into both nostrils daily.    tiotropium (SPIRIVA) 18 MCG inhalation capsule Place 18 mcg into inhaler and inhale daily.    albuterol (VENTOLIN HFA) 108 (90 BASE) MCG/ACT inhaler Inhale 2 puffs into the lungs every 4 (four) hours as needed. Shortness of breath    edoxaban (  SAVAYSA) 60 MG TABS tablet Take 60 mg by mouth daily. Qty: 30 tablet, Refills: 3    nitroGLYCERIN (NITROSTAT) 0.4 MG SL tablet Place 1 tablet (0.4 mg total) under the tongue every 5 (five) minutes as needed for chest pain. Qty: 25 tablet, Refills: 4      STOP taking these medications     albuterol (PROVENTIL) (2.5 MG/3ML) 0.083% nebulizer solution      Fluticasone-Salmeterol (ADVAIR DISKUS) 250-50 MCG/DOSE AEPB        Allergies  Allergen Reactions  . Influenza Vaccine Live Swelling    Follow-up Information    Follow up with [facility] BETH, PA-C.   Specialty:  Physician Assistant   Why:  Follow-up in 1-2 weeks   Contact information:   [place] [id number] [phone]       Follow up with Alonza Bogus, MD.   Specialty:  Pulmonary Disease   Why:  Follow-up on 09/18/14 as recommended.   Contact information:   [address]  [place] [address] [phone]        The results of significant diagnostics from this hospitalization (including imaging, microbiology, ancillary and laboratory) are listed below for reference.    Significant Diagnostic Studies: Dg Chest 2 View  09/10/2014   CLINICAL DATA:  Shortness of breath  EXAM: CHEST  2 VIEW  COMPARISON:  09/08/2014  FINDINGS: Chronic interstitial markings with fibrosis in the left lung apex and bilateral lung bases.  Superimposed lingular/left lower lobe pneumonia is suspected.  No pleural effusion or pneumothorax.  The heart is normal in size.  IMPRESSION: Lingular/left lower lobe pneumonia.  Underlying chronic interstitial lung disease/fibrosis.   Electronically Signed   By: Julian Hy M.D.   On: 09/10/2014 12:00   Dg Chest 2 View  09/08/2014   CLINICAL DATA:  Hemoptysis and night sweats for 1 month  EXAM: CHEST  2 VIEW  COMPARISON:  July 15, 2014 and May 08, 2014  FINDINGS: There is underlying emphysema with fibrotic type change in the lung bases, more on the left than on the right. Scattered areas of scarring are present in both upper and mid lungs. There is patchy infiltrate currently in the left upper lobe near the apex. No other focal infiltrate is seen. The heart size is normal. The pulmonary vascularity reflects underlying emphysema. No adenopathy. No bone lesions.  IMPRESSION: Infiltrate in the apical segment of the left upper lobe. Underlying emphysema with areas of scarring and bibasilar interstitial fibrosis.   Electronically Signed   By: Lowella Grip III M.D.   On: 09/08/2014 10:13   Dg Hip Unilat With Pelvis 2-3 Views Left  09/14/2014   CLINICAL DATA:  Lt sided hip pain anterior and lateral. He fell at home last Friday and his oxygen machine hit his hip  EXAM: LEFT HIP (WITH PELVIS) 2-3 VIEWS  COMPARISON:  None.  FINDINGS: No fracture or bone lesion. Hip joints are normally spaced and aligned with no significant arthropathic change. Bony pelvis is intact. Mild edema is seen in the subcutaneous soft tissues lateral to the left hip.  IMPRESSION: No fracture or dislocation.   Electronically Signed   By: Lajean Manes M.D.   On: 09/14/2014 14:17    Microbiology: Recent Results (from the past 240 hour(s))  Blood culture (routine x 2)     Status: None   Collection Time: 09/08/14 11:12 AM  Result Value Ref Range Status   Specimen Description BLOOD RIGHT ANTECUBITAL  Final   Special Requests BOTTLES DRAWN AEROBIC AND ANAEROBIC [place]  Final   Culture NO GROWTH 5 DAYS  Final   Report Status 09/13/2014 FINAL  Final  Blood culture (routine x 2)     Status: None   Collection Time: 09/08/14 11:20 AM  Result Value Ref Range Status   Specimen Description BLOOD RIGHT HAND  Final   Special Requests BOTTLES DRAWN AEROBIC AND ANAEROBIC 6CC  Final   Culture NO GROWTH 5 DAYS  Final   Report Status 09/13/2014 FINAL  Final  MRSA PCR Screening     Status: None   Collection Time: 09/08/14  3:30 PM  Result Value Ref Range Status   MRSA by PCR NEGATIVE NEGATIVE Final    Comment:        The GeneXpert MRSA Assay (FDA approved for NASAL specimens only), is one component of a comprehensive MRSA colonization surveillance program. It is not intended to diagnose MRSA infection nor to guide or monitor treatment for MRSA infections.   Respiratory virus panel (routine influenza)     Status: None   Collection Time: 09/08/14  3:30 PM  Result Value Ref Range Status   Respiratory Syncytial Virus A Comment Negative Final    Comment: (NOTE) We are UNABLE to  reliably determine a result for the specimen due to the presence of PCR inhibitor(s) in the specimen submitted.  If clinically indicated, please recollect an additional specimen for testing.    Respiratory Syncytial Virus B Comment Negative Final    Comment: (NOTE) We are UNABLE to reliably determine a result for the specimen due to the presence of PCR inhibitor(s) in the specimen submitted.  If clinically indicated, please recollect an additional specimen for testing.    Influenza A Comment Negative Final    Comment: (NOTE) We are UNABLE to reliably determine a result for the specimen due to the presence of PCR inhibitor(s) in the specimen submitted.  If clinically indicated, please recollect an additional specimen for testing.    Influenza B Comment Negative Final    Comment: (NOTE) We are UNABLE to reliably determine a result for the specimen due to the presence of PCR inhibitor(s) in the specimen submitted.  If clinically indicated, please recollect an additional specimen for testing.    Parainfluenza 1 Comment Negative Final    Comment: (NOTE) We are UNABLE to reliably determine a result for the specimen due to the presence of PCR inhibitor(s) in the specimen submitted.  If clinically indicated, please recollect an additional specimen for testing.    Parainfluenza 2 Comment Negative Final    Comment: (NOTE) We are UNABLE to reliably determine a result for the specimen due to the presence of PCR inhibitor(s) in the specimen submitted.  If clinically indicated, please recollect an additional specimen for testing.    Parainfluenza 3 Comment Negative Final    Comment: (NOTE) We are UNABLE to reliably determine a result for the specimen due to the presence of PCR inhibitor(s) in the specimen submitted.  If clinically indicated, please recollect an additional specimen for testing.    Metapneumovirus Comment Negative Final    Comment: (NOTE) We are UNABLE to reliably  determine a result for the specimen due to the presence of PCR inhibitor(s) in the specimen submitted.  If clinically indicated, please recollect an additional specimen for testing.    Rhinovirus Comment Negative Final    Comment: (NOTE) We are UNABLE to reliably determine a result for the specimen due to the presence of  PCR inhibitor(s) in the specimen submitted.  If clinically indicated, please recollect an additional specimen for testing.    Adenovirus Comment Negative Final    Comment: (NOTE) We are UNABLE to reliably determine a result for the specimen due to the presence of PCR inhibitor(s) in the specimen submitted.  If clinically indicated, please recollect an additional specimen for testing. Performed At: [facility] [place], [place] [id number] Lindon Romp MD OH:[id number]      Labs: Basic Metabolic Panel:  Recent Labs Lab 09/09/14 0542 09/10/14 0528 09/13/14 0612 09/15/14 0616  NA 135 135 137  --   K 4.4 5.0 4.9  --   CL 104 103 97  --   CO2 23 24 33*  --   GLUCOSE 239* 142* 257*  --   BUN 19 28* 21  --   CREATININE 1.32 0.99 1.02 1.06  CALCIUM 8.2* 8.6 8.2*  --    Liver Function Tests: No results for input(s): AST, ALT, ALKPHOS, BILITOT, PROT, ALBUMIN in the last 168 hours. No results for input(s): LIPASE, AMYLASE in the last 168 hours. No results for input(s): AMMONIA in the last 168 hours. CBC:  Recent Labs Lab 09/09/14 0542 09/10/14 0528 09/11/14 0431 09/12/14 0405 09/13/14 0612  WBC 10.3 18.2* 10.0 12.5* 8.9  HGB 11.2* 12.5* 12.5* 13.4 12.5*  HCT 35.0* 38.3* 38.7* 41.7 38.7*  MCV 81.8 82.0 81.5 82.7 81.6  PLT 218 263 247 282 233   Cardiac Enzymes: No results for input(s): CKTOTAL, CKMB, CKMBINDEX, TROPONINI in the last 168 hours. BNP: BNP (last 3 results) No results for input(s): BNP in the last 8760 hours.  ProBNP (last 3 results)  Recent Labs  10/31/13 0152  PROBNP 359.3*    CBG:  Recent  Labs Lab 09/14/14 1119 09/14/14 1626 09/14/14 2106 09/15/14 0752 09/15/14 1131  GLUCAP 304* 121* 223* 117* 226*       Signed:  Ethleen Lormand  [facility] 09/15/2014, 1:14 PM

## 2014-09-15 NOTE — Progress Notes (Addendum)
ANTIBIOTIC CONSULT NOTE  Pharmacy Consult for Rocephin Indication: CAP  Allergies  Allergen Reactions  . Influenza Vaccine Live Swelling   Patient Measurements: Height: 5\' 7"  (170.2 cm) Weight: 145 lb 15.1 oz (66.2 kg) IBW/kg (Calculated) : 66.1  Vital Signs: Temp: 97.7 F (36.5 C) (03/04 0608) Temp Source: Oral (03/04 9798) BP: 119/84 mmHg (03/04 9211) Pulse Rate: 64 (03/04 0608) Intake/Output from previous day: 03/03 0701 - 03/04 0700 In: 770 [P.O.:720; IV Piggyback:50] Out: 1500 [Urine:1500] Intake/Output from this shift: Total I/O In: 360 [P.O.:360] Out: 250 [Urine:250]  Labs:  Recent Labs  09/13/14 0612 09/15/14 0616  WBC 8.9  --   HGB 12.5*  --   PLT 233  --   CREATININE 1.02 1.06   Estimated Creatinine Clearance: 73.6 mL/min (by C-G formula based on Cr of 1.06). No results for input(s): VANCOTROUGH, VANCOPEAK, VANCORANDOM, GENTTROUGH, GENTPEAK, GENTRANDOM, TOBRATROUGH, TOBRAPEAK, TOBRARND, AMIKACINPEAK, AMIKACINTROU, AMIKACIN in the last 72 hours.   Microbiology: Recent Results (from the past 720 hour(s))  Blood culture (routine x 2)     Status: None   Collection Time: 09/08/14 11:12 AM  Result Value Ref Range Status   Specimen Description BLOOD RIGHT ANTECUBITAL  Final   Special Requests BOTTLES DRAWN AEROBIC AND ANAEROBIC 6CC  Final   Culture NO GROWTH 5 DAYS  Final   Report Status 09/13/2014 FINAL  Final  Blood culture (routine x 2)     Status: None   Collection Time: 09/08/14 11:20 AM  Result Value Ref Range Status   Specimen Description BLOOD RIGHT HAND  Final   Special Requests BOTTLES DRAWN AEROBIC AND ANAEROBIC 6CC  Final   Culture NO GROWTH 5 DAYS  Final   Report Status 09/13/2014 FINAL  Final  MRSA PCR Screening     Status: None   Collection Time: 09/08/14  3:30 PM  Result Value Ref Range Status   MRSA by PCR NEGATIVE NEGATIVE Final    Comment:        The GeneXpert MRSA Assay (FDA approved for NASAL specimens only), is one component  of a comprehensive MRSA colonization surveillance program. It is not intended to diagnose MRSA infection nor to guide or monitor treatment for MRSA infections.   Respiratory virus panel (routine influenza)     Status: None   Collection Time: 09/08/14  3:30 PM  Result Value Ref Range Status   Respiratory Syncytial Virus A Comment Negative Final    Comment: (NOTE) We are UNABLE to reliably determine a result for the specimen due to the presence of PCR inhibitor(s) in the specimen submitted.  If clinically indicated, please recollect an additional specimen for testing.    Respiratory Syncytial Virus B Comment Negative Final    Comment: (NOTE) We are UNABLE to reliably determine a result for the specimen due to the presence of PCR inhibitor(s) in the specimen submitted.  If clinically indicated, please recollect an additional specimen for testing.    Influenza A Comment Negative Final    Comment: (NOTE) We are UNABLE to reliably determine a result for the specimen due to the presence of PCR inhibitor(s) in the specimen submitted.  If clinically indicated, please recollect an additional specimen for testing.    Influenza B Comment Negative Final    Comment: (NOTE) We are UNABLE to reliably determine a result for the specimen due to the presence of PCR inhibitor(s) in the specimen submitted.  If clinically indicated, please recollect an additional specimen for testing.    Parainfluenza 1 Comment  Negative Final    Comment: (NOTE) We are UNABLE to reliably determine a result for the specimen due to the presence of PCR inhibitor(s) in the specimen submitted.  If clinically indicated, please recollect an additional specimen for testing.    Parainfluenza 2 Comment Negative Final    Comment: (NOTE) We are UNABLE to reliably determine a result for the specimen due to the presence of PCR inhibitor(s) in the specimen submitted.  If clinically indicated, please recollect an  additional specimen for testing.    Parainfluenza 3 Comment Negative Final    Comment: (NOTE) We are UNABLE to reliably determine a result for the specimen due to the presence of PCR inhibitor(s) in the specimen submitted.  If clinically indicated, please recollect an additional specimen for testing.    Metapneumovirus Comment Negative Final    Comment: (NOTE) We are UNABLE to reliably determine a result for the specimen due to the presence of PCR inhibitor(s) in the specimen submitted.  If clinically indicated, please recollect an additional specimen for testing.    Rhinovirus Comment Negative Final    Comment: (NOTE) We are UNABLE to reliably determine a result for the specimen due to the presence of PCR inhibitor(s) in the specimen submitted.  If clinically indicated, please recollect an additional specimen for testing.    Adenovirus Comment Negative Final    Comment: (NOTE) We are UNABLE to reliably determine a result for the specimen due to the presence of PCR inhibitor(s) in the specimen submitted.  If clinically indicated, please recollect an additional specimen for testing. Performed At: [facility] [place], [place] [id number] Lindon Romp MD WU:[id number]    Medical History: Past Medical History  Diagnosis Date  . COPD (chronic obstructive pulmonary disease)   . Hypercholesteremia   . History of pneumonia   . Polycythemia   . Coronary atherosclerosis of native coronary artery     Mild nonobstructive disease at catheterization 2007  . 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 Borderline diabetes   . Seizures     Last seizure 2 yrs ago  . GERD (gastroesophageal reflux disease)   . Type 2 diabetes mellitus   . DDD (degenerative disc disease)     Cervical and thoracic  . Chronic back pain   . Bronchitis   . Chronic left shoulder pain   . Chronic neck pain   . Atrial fibrillation     Not  anticoagulated  . Smoker   . Type 2 diabetes mellitus without complication    Anti-infectives    Start     Dose/Rate Route Frequency Ordered Stop   09/11/14 1000  azithromycin (ZITHROMAX) tablet 500 mg     500 mg Oral Daily 09/11/14 0901 09/17/14 0959   09/09/14 1100  cefTRIAXone (ROCEPHIN) 1 g in dextrose 5 % 50 mL IVPB - Premix     1 g 100 mL/hr over 30 Minutes Intravenous Every 24 hours 09/08/14 1623     09/09/14 1000  azithromycin (ZITHROMAX) 500 mg in dextrose 5 % 250 mL IVPB  Status:  Discontinued     500 mg 250 mL/hr over 60 Minutes Intravenous Every 24 hours 09/08/14 1623 09/11/14 0901   09/08/14 1800  cefTRIAXone (ROCEPHIN) 1 g in dextrose 5 % 50 mL IVPB - Premix  Status:  Discontinued     1 g 100 mL/hr over 30 Minutes Intravenous Every 24 hours 09/08/14 1620 09/08/14 1623   09/08/14 1730  azithromycin (ZITHROMAX) 500 mg in dextrose 5 % 250 mL IVPB  Status:  Discontinued     500 mg 250 mL/hr over 60 Minutes Intravenous Every 24 hours 09/08/14 1610 09/08/14 1623   09/08/14 1100  cefTRIAXone (ROCEPHIN) 1 g in dextrose 5 % 50 mL IVPB     1 g 100 mL/hr over 30 Minutes Intravenous  Once 09/08/14 1053 09/08/14 1341   09/08/14 1100  azithromycin (ZITHROMAX) 500 mg in dextrose 5 % 250 mL IVPB     500 mg 250 mL/hr over 60 Minutes Intravenous  Once 09/08/14 1053 09/08/14 1257     Assessment: 54yo male with good renal fxn.  He is currently on day#7 Zithromax and Rocephin for CAP.  Patient is clinically improving and discharge anticipated soon.  Renal dose adjustment is not needed for Rocephin or Zithromax.    Goal of Therapy:  Eradicate infection.  Plan:  Continue Rocephin 1gm IV q24hrs & Zithromax 500mg  po q24h Duration of therapy per MD- recommend d/c abx if clinically appropriate at this point Pharmacy to sign off.  Please re-consult if needed  Biagio Borg 09/15/2014,12:42 PM

## 2014-09-16 ENCOUNTER — Encounter (HOSPITAL_COMMUNITY): Payer: Self-pay | Admitting: Internal Medicine

## 2014-09-18 NOTE — Care Management Utilization Note (Signed)
UR completed 

## 2014-09-25 ENCOUNTER — Ambulatory Visit (INDEPENDENT_AMBULATORY_CARE_PROVIDER_SITE_OTHER): Payer: Medicare Other | Admitting: Physician Assistant

## 2014-09-25 ENCOUNTER — Encounter: Payer: Self-pay | Admitting: Physician Assistant

## 2014-09-25 VITALS — BP 86/50 | HR 84 | Temp 97.3°F | Resp 20 | Wt 140.0 lb

## 2014-09-25 DIAGNOSIS — M25561 Pain in right knee: Secondary | ICD-10-CM | POA: Diagnosis not present

## 2014-09-25 DIAGNOSIS — M79632 Pain in left forearm: Secondary | ICD-10-CM | POA: Diagnosis not present

## 2014-09-25 DIAGNOSIS — J441 Chronic obstructive pulmonary disease with (acute) exacerbation: Secondary | ICD-10-CM

## 2014-09-25 DIAGNOSIS — F17200 Nicotine dependence, unspecified, uncomplicated: Secondary | ICD-10-CM

## 2014-09-25 DIAGNOSIS — M79642 Pain in left hand: Secondary | ICD-10-CM

## 2014-09-25 DIAGNOSIS — J42 Unspecified chronic bronchitis: Secondary | ICD-10-CM | POA: Diagnosis not present

## 2014-09-25 DIAGNOSIS — J189 Pneumonia, unspecified organism: Secondary | ICD-10-CM | POA: Diagnosis not present

## 2014-09-25 DIAGNOSIS — Z720 Tobacco use: Secondary | ICD-10-CM

## 2014-09-25 DIAGNOSIS — I2510 Atherosclerotic heart disease of native coronary artery without angina pectoris: Secondary | ICD-10-CM

## 2014-09-25 DIAGNOSIS — E119 Type 2 diabetes mellitus without complications: Secondary | ICD-10-CM | POA: Diagnosis not present

## 2014-09-25 DIAGNOSIS — I482 Chronic atrial fibrillation, unspecified: Secondary | ICD-10-CM

## 2014-09-25 NOTE — Progress Notes (Signed)
Patient ID: Levone Otten Dohrmann MRN: [id number], DOB: 1960-09-16, 54 y.o. Date of Encounter: @DATE @  Chief Complaint:  Chief Complaint  Patient presents with  . hospital follow up    a-fib, pneumonia    HPI: 54 y.o. year old male  presents her office visit follow-up recent hospitalization. Says that when he was discharged from the hospital he was told to follow-up here today. Says that he also has a nurse coming to his house to check him 3 days a week. Says that they check his vital signs listens to his heart and lungs just to make sure things remain stable. Says that he sees his pulmonologist Dr. Luan Pulling and has an office visit scheduled with him for this Wednesday 09/27/14. That he on was also seen by Dr. Luan Pulling during his recent hospitalization.  He states that since his hospitalization he feels that his breathing is back to baseline. Reports that he is taking his medications as directed at the time of discharge. He reports that he has not smoked in 20 days.  He states that he fell on the day of admission. Says that he is now having pain in his left hand and left forearm as well as his right knee. I noted that they have during the hospitalization they x-rayed his left hip and pelvis. Patient says that at that time that was the only area that was hurting and then they had him on pain medicines in the hospital. But,  ever since being out of the hospital and off pain medicine he is now noticing pain in these areas listed above.  I have reviewed his hospital discharge summary today. He presented to the ER on 09/08/14 with chief complaint of progressive worsening shortness of breath. Chest x-ray revealed infiltrate in the left upper lobe and underlying emphysema with areas of scarring and  bibasilar interstitial fibrosis. EKG revealed atrial fibrillation with heart rate 181 bpm.  He was on continued oxygen titrated to keep oxygen saturations greater than 89%. Treated for COPD exacerbation and  pneumonia. Diagnosed with community-acquired pneumonia. Was treated with azithromycin and Rocephin. Completed a one-week course of IV antibiotics during the hospital course. Treated with IV Solu-Medrol bronchodilators--Xopenox. He was discharged with prednisone taper and bronchodilator therapy and Xopenex.    Past Medical History  Diagnosis Date  . COPD (chronic obstructive pulmonary disease)   . Hypercholesteremia   . History of pneumonia   . Polycythemia   . Coronary atherosclerosis of native coronary artery     Mild nonobstructive disease at catheterization 2007  . 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 Borderline diabetes   . Seizures     Last seizure 2 yrs ago  . GERD (gastroesophageal reflux disease)   . Type 2 diabetes mellitus   . DDD (degenerative disc disease)     Cervical and thoracic  . Chronic back pain   . Bronchitis   . Chronic left shoulder pain   . Chronic neck pain   . Atrial fibrillation     Not anticoagulated  . Smoker   . Type 2 diabetes mellitus without complication   . CAP (community acquired pneumonia) 07/10/2013     Home Meds: Outpatient Prescriptions Prior to Visit  Medication Sig Dispense Refill  . albuterol (VENTOLIN HFA) 108 (90 BASE) MCG/ACT inhaler Inhale 2 puffs into the lungs every 4 (four) hours as needed. Shortness of breath    . benzonatate (TESSALON) 100 MG capsule Take  1 capsule (100 mg total) by mouth 3 (three) times daily as needed for cough. 20 capsule 0  . budesonide-formoterol (SYMBICORT) 80-4.5 MCG/ACT inhaler Inhale 2 puffs into the lungs 2 (two) times daily. 1 Inhaler 12  . diltiazem (CARDIZEM CD) 180 MG 24 hr capsule Take 180 mg by mouth daily.  0  . edoxaban (SAVAYSA) 60 MG TABS tablet Take 60 mg by mouth daily. 30 tablet 3  . fluticasone (FLONASE) 50 MCG/ACT nasal spray Place 2 sprays into both nostrils daily.    [name] levalbuterol (XOPENEX) 0.63 MG/3ML nebulizer solution Take 3 mLs (0.63 mg  total) by nebulization 3 (three) times daily. 3 mL 12  . LORazepam (ATIVAN) 0.5 MG tablet Take 1 tablet (0.5 mg total) by mouth 2 (two) times daily as needed for anxiety or sleep. 20 tablet 0  . metFORMIN (GLUCOPHAGE) 500 MG tablet Take 1 tablet (500 mg total) by mouth daily with breakfast. 30 tablet 3  . nitroGLYCERIN (NITROSTAT) 0.4 MG SL tablet Place 1 tablet (0.4 mg total) under the tongue every 5 (five) minutes as needed for chest pain. 25 tablet 4  . oxyCODONE-acetaminophen (ROXICET) 5-325 MG per tablet Take 1 tablet by mouth every 4 (four) hours as needed for severe pain. 25 tablet 0  . tiotropium (SPIRIVA) 18 MCG inhalation capsule Place 18 mcg into inhaler and inhale daily.    . predniSONE (DELTASONE) 20 MG tablet Take 1 tablet (20 mg total) by mouth daily with breakfast. Starting tomorrow, take for 4 more days. 4 tablet 0   No facility-administered medications prior to visit.    Allergies:  Allergies  Allergen Reactions  . Influenza Vaccine Live Swelling    History   Social History  . Marital Status: Married    Spouse Name: [name]  . Number of Children: 4  . Years of Education: 10   Occupational History  . Not on file.   Social History Main Topics  . Smoking status: Former Smoker -- 0.50 packs/day for 45 years    Types: Cigarettes    Start date: 12/07/1965    Quit date: 09/05/2014  . Smokeless tobacco: Never Used     Comment: started smoking half pack 2 days ago per stopped in the hospital as of 07-20-2013  . Alcohol Use: No  . Drug Use: No  . Sexual Activity: Yes    Birth Control/ Protection: Surgical   Other Topics Concern  . Not on file   Social History Narrative   Patient is married [name]) and lives at home with his wife and one child.   Patient has four children.   Patient is disabled.   Patient has a high school education.   Patient is left-handed.   Patient does not drink any caffeine.    Family History  Problem Relation Age of Onset  .  Hypertension Mother   . Diabetes Mother   . Heart attack Mother   . Heart failure Mother   . Hypertension Sister   . Diabetes Sister   . Heart failure Sister      Review of Systems:  See HPI for pertinent ROS. All other ROS negative.    Physical Exam: Blood pressure 86/50, pulse 84, temperature 97.3 F (36.3 C), temperature source Oral, resp. rate 20, weight 140 lb (63.504 kg)., Body mass index is 21.92 kg/(m^2). General: Thin WM On Nasal Cannula Oxygen. Appears in no acute distress. Neck: Supple. No thyromegaly. No lymphadenopathy. Lungs: Distant, Decreased Breath Sounds throughout but Clear. No rhonchi or  wheezes. Heart: Irregular.  Musculoskeletal:  Strength and tone normal for age. Left hand and left forearm are normal with no ecchymosis and no abrasion. Right anterior knee with normal appearance with no ecchymosis no swelling no abrasion Extremities/Skin: Warm and dry. Neuro: Alert and oriented X 3. Moves all extremities spontaneously. Gait is normal. CNII-XII grossly in tact. Psych:  Responds to questions appropriately with a normal affect.     ASSESSMENT AND PLAN:  54 y.o. year old male with  1. TOBACCO ABUSE  2. Chronic bronchitis, unspecified chronic bronchitis type  3. ASCVD (arteriosclerotic cardiovascular disease)  4. Type 2 diabetes mellitus without complication  5. CAP (community acquired pneumonia)  55. Chronic atrial fibrillation  7. COPD exacerbation  8. Right knee pain - DG Knee Complete 4 Views Right; Future  9. Left hand pain - DG Hand Complete Left; Future  10. Left forearm pain - DG Forearm Left; Future   Obtain x-rays of left hand and left forearm and right knee. Otherwise he is stable and can wait to follow-up with his pulmonologist Dr. Luan Pulling at office visit scheduled with him this Wednesday.  Marin Olp Rondo, [place], [facility] 09/25/2014 2:14 PM

## 2014-09-26 ENCOUNTER — Ambulatory Visit
Admission: RE | Admit: 2014-09-26 | Discharge: 2014-09-26 | Disposition: A | Discharge status: Home / Self Care | Payer: Medicare Other | Source: Ambulatory Visit | Attending: Physician Assistant | Admitting: Physician Assistant

## 2014-09-26 DIAGNOSIS — M25561 Pain in right knee: Secondary | ICD-10-CM

## 2014-09-26 DIAGNOSIS — M79642 Pain in left hand: Secondary | ICD-10-CM

## 2014-09-26 DIAGNOSIS — M79632 Pain in left forearm: Secondary | ICD-10-CM

## 2014-09-26 IMAGING — CR DG CHEST 2V
2 series · 2 of 2 positions shown · non-contrast
Comparison: Two-view chest x-ray 04/10/2013, 02/17/2013. CT chest
12/21/2012.

CLINICAL DATA: One month history of cough and shortness of breath.
Smoker with history of emphysema. Known cavitary left upper lobe
lung mass and left hilar lymphadenopathy.

EXAM:
CHEST  2 VIEW

[w chest pa]
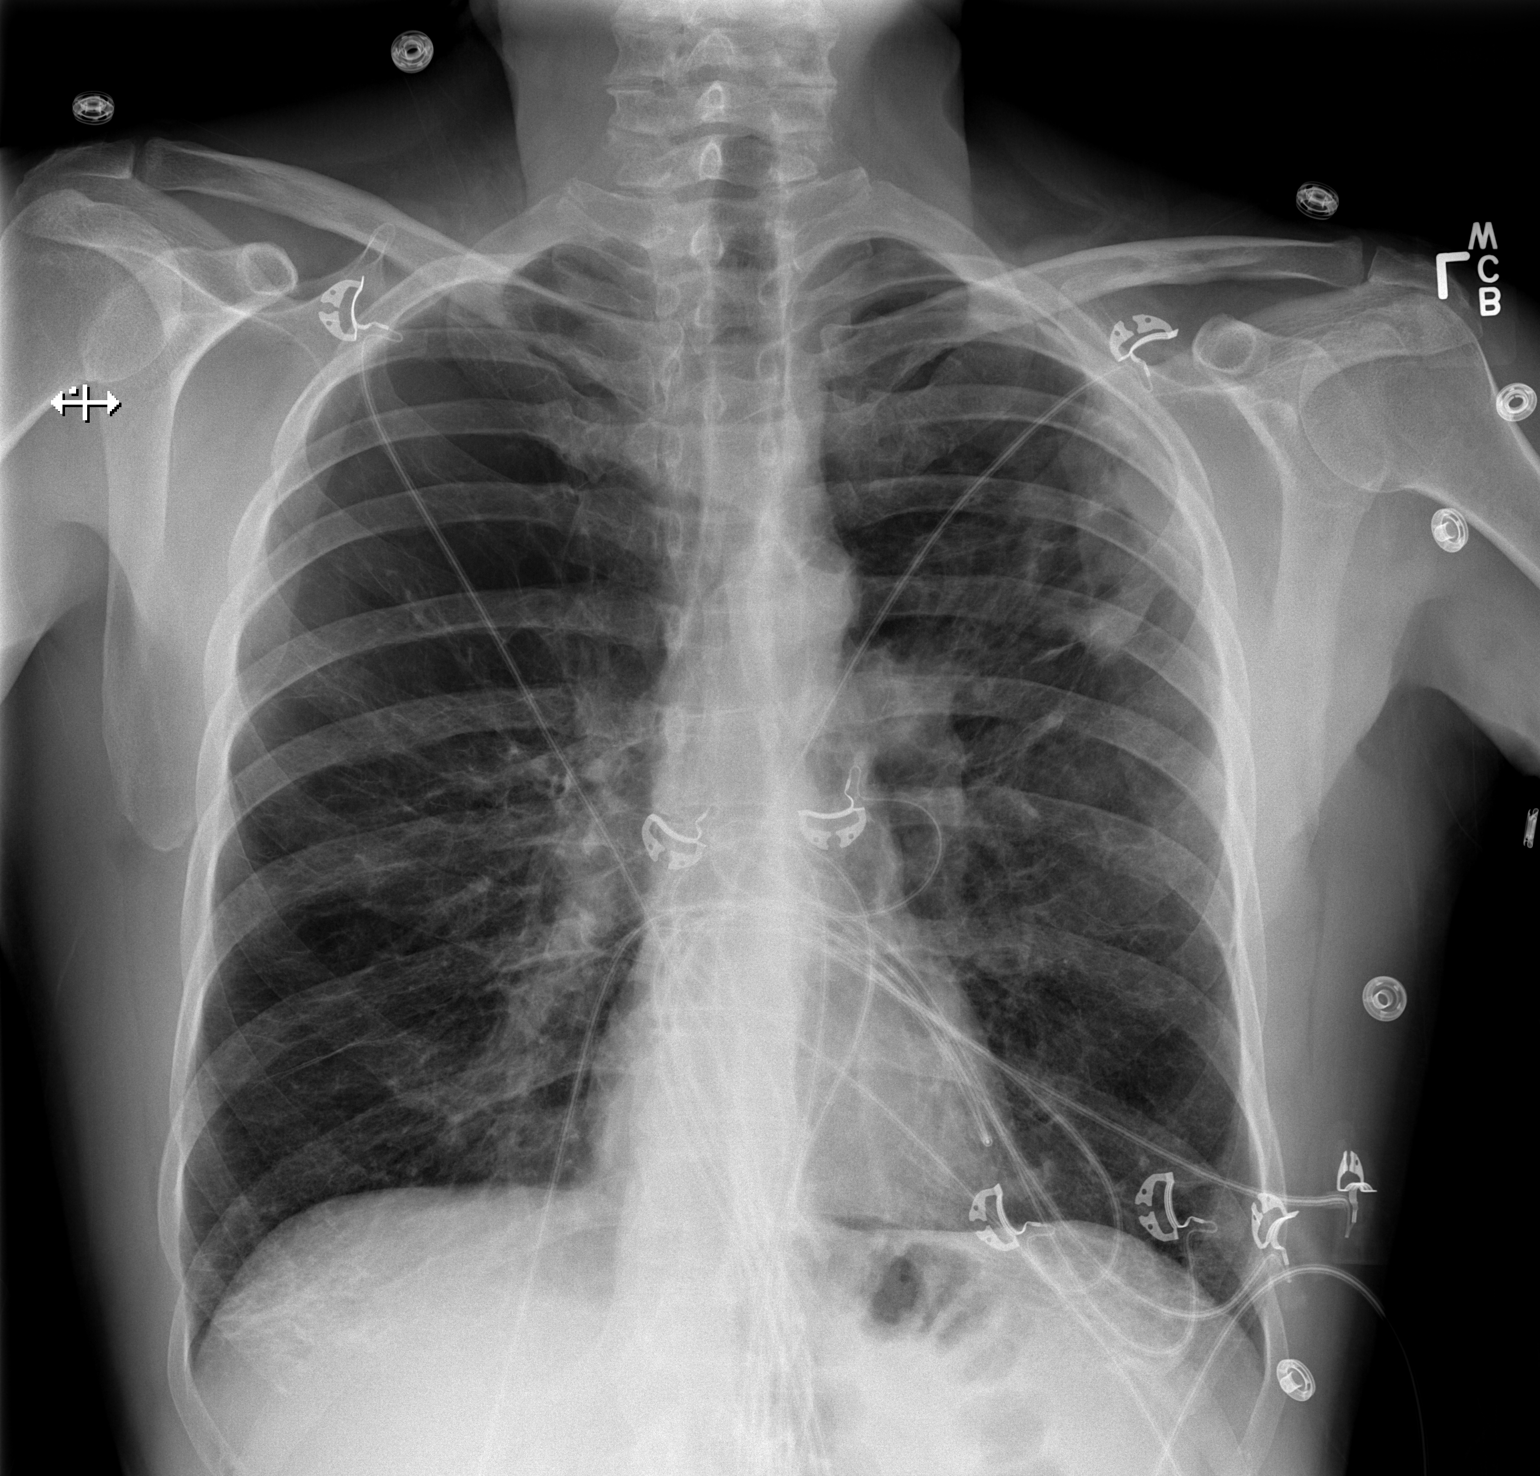

[w chest lat]
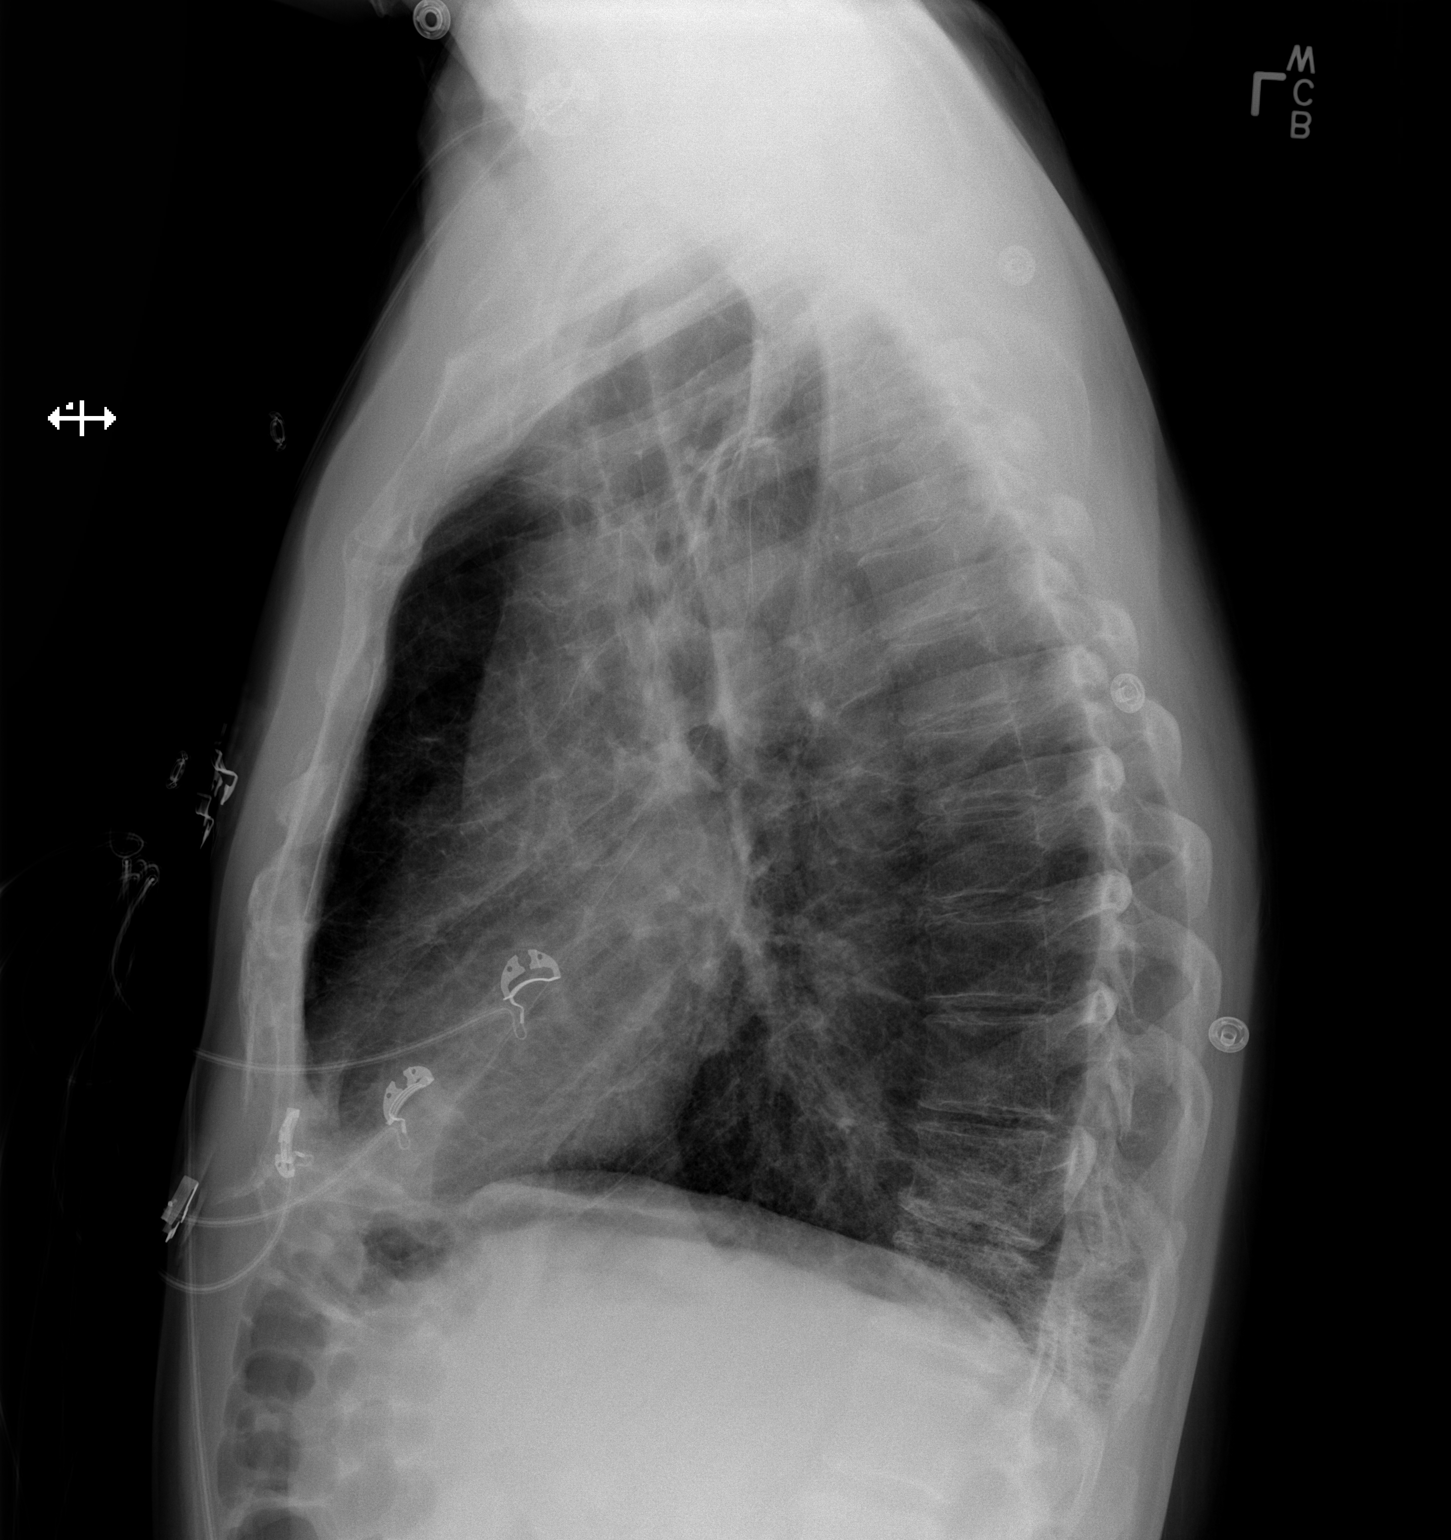

[2 of 2 positions shown; findings below may reference images not displayed]

FINDINGS: Cardiac silhouette normal in size, unchanged. Left hilar adenopathy,
unchanged. Hilar and mediastinal contours otherwise unremarkable.
Cavitary mass with air-fluid level in the lateral left upper lobe,
unchanged. View streaky airspace opacities in the right lower lobe.
No new abnormalities elsewhere in either lung. Severe bullous
emphysematous changes. Visualized bony thorax intact.
IMPRESSION: 1. Acute right lower lobe pneumonia superimposed upon severe
COPD/emphysema.
2. Stable cavitary mass in the left upper lobe and left hilar
lymphadenopathy.

## 2014-09-27 IMAGING — CR DG CHEST 1V PORT
1 series · 1 of 1 positions shown · non-contrast
Comparison: Chest radiograph performed 07/09/2013

CLINICAL DATA: Shortness of breath, midsternal chest pain and
tachycardia.

EXAM:
PORTABLE CHEST - 1 VIEW

[portable]
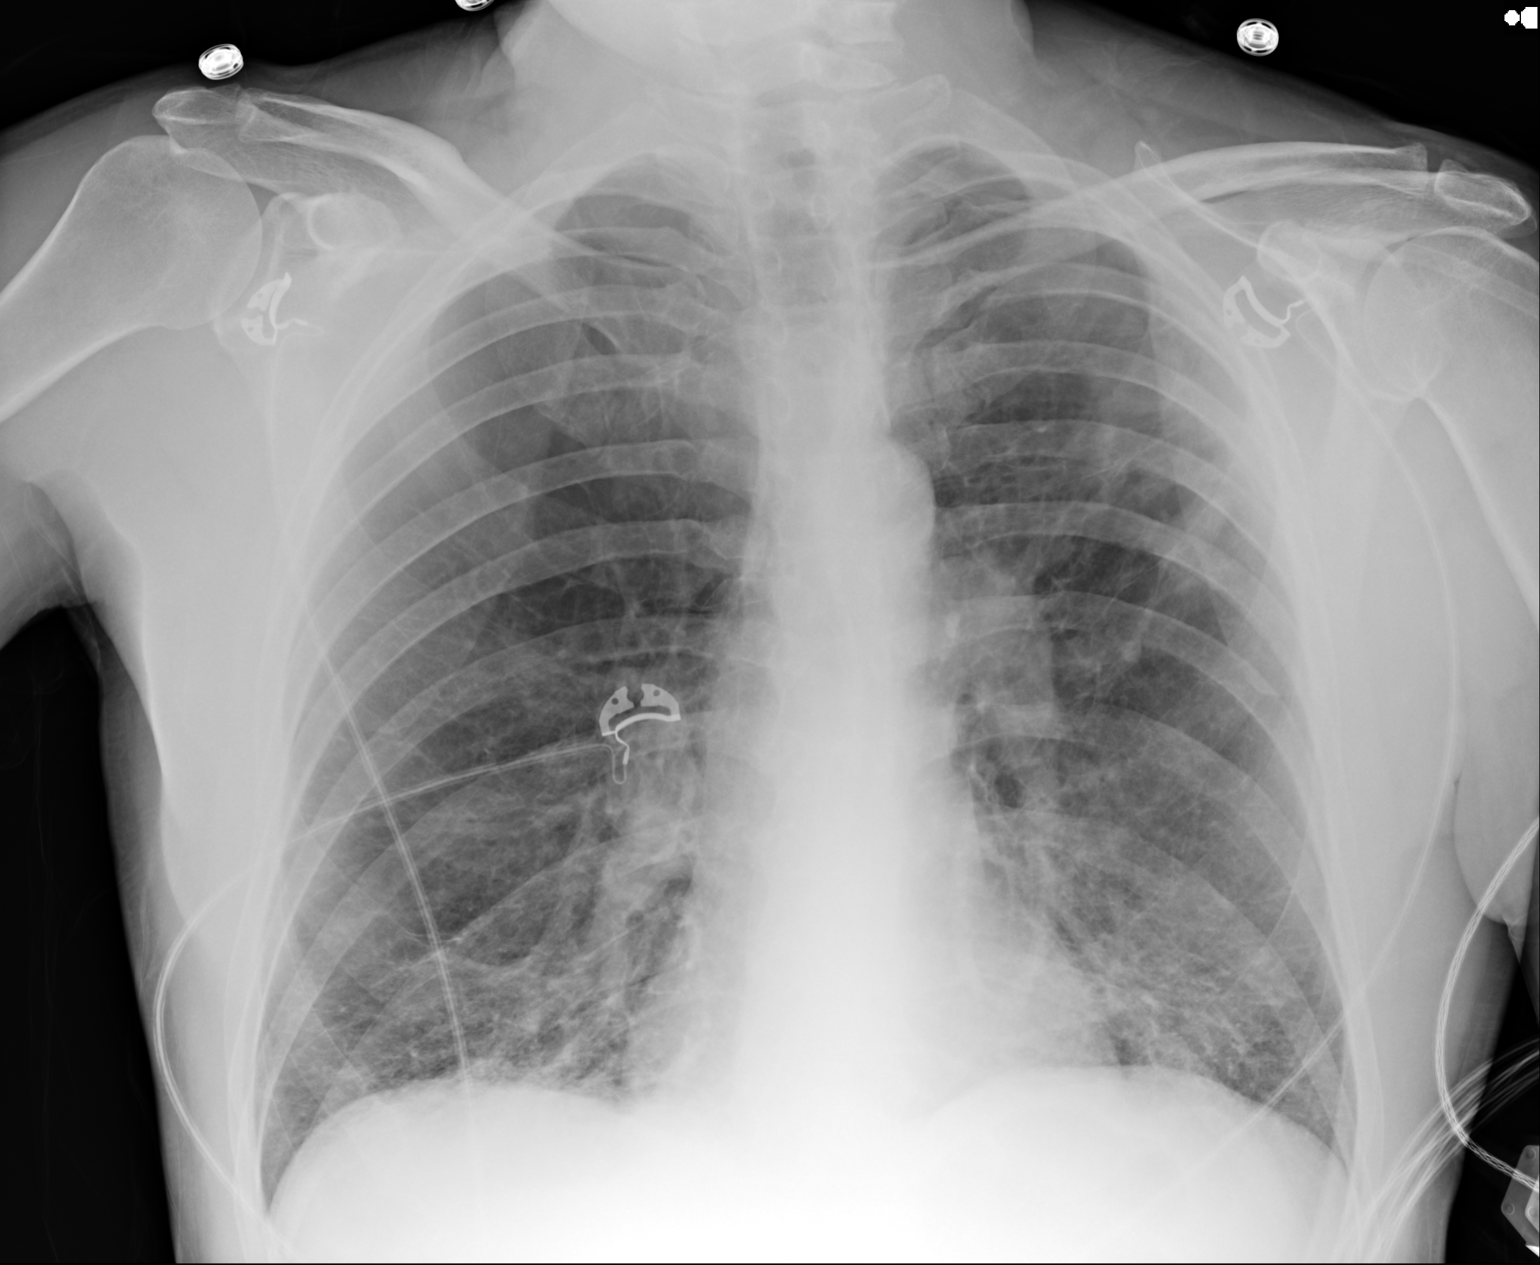

[1 of 1 positions shown; findings below may reference images not displayed]

FINDINGS: New bibasilar airspace opacities are seen. This may reflect mild
pneumonia or possibly minimal interstitial edema. No pleural
effusion or pneumothorax is seen.

The cardiomediastinal silhouette is normal in size. No acute osseous
abnormalities are identified.
IMPRESSION: New bibasilar airspace opacities noted. This may reflect mild
pneumonia or possibly minimal interstitial edema, though no
ancillary findings are seen to suggest edema.

## 2014-10-09 ENCOUNTER — Encounter: Payer: Self-pay | Admitting: Physician Assistant

## 2014-10-24 ENCOUNTER — Encounter: Payer: Self-pay | Admitting: Adult Health

## 2014-10-24 ENCOUNTER — Ambulatory Visit (INDEPENDENT_AMBULATORY_CARE_PROVIDER_SITE_OTHER): Payer: Medicare Other | Admitting: Adult Health

## 2014-10-24 VITALS — BP 110/74 | HR 97 | Ht 68.0 in | Wt 141.0 lb

## 2014-10-24 DIAGNOSIS — Z87891 Personal history of nicotine dependence: Secondary | ICD-10-CM | POA: Diagnosis not present

## 2014-10-24 DIAGNOSIS — I2510 Atherosclerotic heart disease of native coronary artery without angina pectoris: Secondary | ICD-10-CM | POA: Diagnosis not present

## 2014-10-24 DIAGNOSIS — I480 Paroxysmal atrial fibrillation: Secondary | ICD-10-CM

## 2014-10-24 NOTE — Progress Notes (Signed)
Cardiology Office Note   Date:  10/24/2014   ID:  Ryan [name], DOB 1961-05-02, MRN [id number]  PCP:  [name], PA-C  Cardiologist:  Bronson Ing (to be est) Jory Sims, NP   Chief Complaint  Patient presents with  . Atrial Fibrillation  . Coronary Artery Disease      History of Present Illness: Ryan [name] is a 54 y.o. male who presents for ongoing assessment and management of atrial fibrillation and CAD, with ongoing tobacco abuse, hx of COPD and recent hospitalization for community acquired pneumonia. He was in atrial fib with RVR and placed on a diltiazem gtt and transitioned to po diltiazem when HR normalized. His anticoagulation was temporarily stopped due to hemoptysis but restarted on discharge. He is here for post-hospital follow up.   He comes today without complaint. He has STOPPED SMOKING!! He states that when he was in the hospital he was given nicotine patches and has not started back since discharge. He states he feels better has no chest pain or DOE. He is saving [place] each month. He denies palpitations or racing heart rate. Has not desire to return to smoking.     Past Medical History  Diagnosis Date  . COPD (chronic obstructive pulmonary disease)   . Hypercholesteremia   . History of pneumonia   . Polycythemia   . Coronary atherosclerosis of native coronary artery     Mild nonobstructive disease at catheterization 2007  . 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 Borderline diabetes   . Seizures     Last seizure 2 yrs ago  . GERD (gastroesophageal reflux disease)   . Type 2 diabetes mellitus   . DDD (degenerative disc disease)     Cervical and thoracic  . Chronic back pain   . Bronchitis   . Chronic left shoulder pain   . Chronic neck pain   . Atrial fibrillation     Not anticoagulated  . Smoker   . Type 2 diabetes mellitus without complication   . CAP (community acquired pneumonia) 07/10/2013    Past  Surgical History  Procedure Laterality Date  . Colonoscopy w/ endoscopic [place]    . Vasectomy    . Throat biopsy    . Lung biopsy    . Vasectomy  1987     Current Outpatient Prescriptions  Medication Sig Dispense Refill  . albuterol (VENTOLIN HFA) 108 (90 BASE) MCG/ACT inhaler Inhale 2 puffs into the lungs every 4 (four) hours as needed. Shortness of breath    . benzonatate (TESSALON) 100 MG capsule Take 1 capsule (100 mg total) by mouth 3 (three) times daily as needed for cough. 20 capsule 0  . Blood Glucose Calibration (OT ULTRA/FASTTK CNTRL SOLN) SOLN     . Blood Glucose Monitoring Suppl (ONE TOUCH ULTRA 2) W/DEVICE KIT     . budesonide-formoterol (SYMBICORT) 80-4.5 MCG/ACT inhaler Inhale 2 puffs into the lungs 2 (two) times daily. 1 Inhaler 12  . diltiazem (CARDIZEM CD) 120 MG 24 hr capsule     . edoxaban (SAVAYSA) 60 MG TABS tablet Take 60 mg by mouth daily. 30 tablet 3  . fluticasone (FLONASE) 50 MCG/ACT nasal spray Place 2 sprays into both nostrils daily.    [name] LANCETS ULTRA THIN MISC     . levalbuterol (XOPENEX) 0.63 MG/3ML nebulizer solution Take 3 mLs (0.63 mg total) by nebulization 3 (three) times daily. 3 mL 12  . LORazepam (ATIVAN) 0.5 MG tablet  Take 1 tablet (0.5 mg total) by mouth 2 (two) times daily as needed for anxiety or sleep. 20 tablet 0  . metFORMIN (GLUCOPHAGE) 500 MG tablet Take 1 tablet (500 mg total) by mouth daily with breakfast. 30 tablet 3  . nitroGLYCERIN (NITROSTAT) 0.4 MG SL tablet Place 1 tablet (0.4 mg total) under the tongue every 5 (five) minutes as needed for chest pain. 25 tablet 4  . oxyCODONE-acetaminophen (ROXICET) 5-325 MG per tablet Take 1 tablet by mouth every 4 (four) hours as needed for severe pain. 25 tablet 0  . tiotropium (SPIRIVA) 18 MCG inhalation capsule Place 18 mcg into inhaler and inhale daily.     No current facility-administered medications for this visit.    Allergies:   Influenza vaccine live    Social History:  The patient   reports that he quit smoking about 7 weeks ago. His smoking use included Cigarettes. He started smoking about 48 years ago. He has a 22.5 pack-year smoking history. He has never used smokeless tobacco. He reports that he does not drink alcohol or use illicit drugs.   Family History:  The patient's family history includes Diabetes in his mother and sister; Heart attack in his mother; Heart failure in his mother and sister; Hypertension in his mother and sister.    ROS: .   All other systems are reviewed and negative.Unless otherwise mentioned in H&P above.   PHYSICAL EXAM: VS:  BP 110/74 mmHg  Pulse 97  Ht '5\' 8"'  (1.727 m)  Wt 141 lb (63.957 kg)  BMI 21.44 kg/m2  SpO2 92% , BMI Body mass index is 21.44 kg/(m^2). GEN: Well nourished, well developed, in no acute distress HEENT: normal Neck: no JVD, carotid bruits, or masses Cardiac: IRRR; no murmurs, rubs, or gallops,no edema  Respiratory:  clear to auscultation bilaterally, normal work of breathing GI: soft, nontender, nondistended, + BS MS: no deformity or atrophy Skin: warm and dry, no rash Neuro:  Strength and sensation are intact Psych: euthymic mood, full affect  Recent Labs: 10/31/2013: Pro B Natriuretic peptide (BNP) 359.3* 09/08/2014: ALT 16 09/13/2014: BUN 21; Hemoglobin 12.5*; Platelets 233; Potassium 4.9; Sodium 137 09/15/2014: Creatinine 1.06    Lipid Panel    Component Value Date/Time   CHOL 131 06/23/2011 0555   TRIG 83 06/23/2011 0555   TRIG 79 05/17/2010 0000   HDL 29* 06/23/2011 0555   CHOLHDL 4.5 06/23/2011 0555   VLDL 17 06/23/2011 0555   LDLCALC 85 06/23/2011 0555   LDLCALC 101 05/17/2010 0000      Wt Readings from Last 3 Encounters:  10/24/14 141 lb (63.957 kg)  09/25/14 140 lb (63.504 kg)  09/12/14 145 lb 15.1 oz (66.2 kg)      Other studies Reviewed: Additional studies/ records that were reviewed today include: Discharge summary Review of the above records demonstrates:    ASSESSMENT AND  PLAN:  1. Atrial fibrillation: Heart rate is well controlled currently. He is given samples of Savaysa, 30 mg tablets, he is to take two each day. He is able to afford his other medications without problem. I have inquired about changing to Xarelto, but he states he cannot afford it either and is not covered by his insurance yet. Will keep him on current medication regimen.   2. CAD: Non-obstructive per cath in 2007. Will not plan any stress tests unless he is symptomatic.   3.Tobacco abuse: He has stopped smoking for one month. He is encouraged to continue this lifestyle change and congratulated  on his accomplishment.   Current medicines are reviewed at length with the patient today.    Labs/ tests ordered today include: None No orders of the defined types were placed in this encounter.     Disposition:   FU with [place] in 6 months  Signed, Jory Sims, NP  10/24/2014 1:50 PM    [facility]. [address], [place], [place] [address] Phone: [phone]; Fax: [phone]

## 2014-10-24 NOTE — Patient Instructions (Signed)
Your physician wants you to follow-up in: 6 months with Jory Sims, NP. You will receive a reminder letter in the mail two months in advance. If you don't receive a letter, please call our office to schedule the follow-up appointment.  Your physician recommends that you continue on your current medications as directed. Please refer to the Current Medication list given to you today.  You have been samples of Savaysa today.  Thank you for choosing [facility]!

## 2014-10-24 NOTE — Progress Notes (Deleted)
Name: Ryan [name]    DOB: 09/21/1960  Age: 54 y.o.  MR#: [id number]       PCP:  [name], PA-C      Insurance: Payor: [employer] MEDICARE / Plan: [employer] MEDICARE / Product Type: *No Product type* /   CC:    Chief Complaint  Patient presents with  . Atrial Fibrillation  . Coronary Artery Disease    VS Filed Vitals:   10/24/14 1323  BP: 110/74  Pulse: 97  Height: '5\' 8"'  (1.727 m)  Weight: 141 lb (63.957 kg)  SpO2: 92%    Weights Current Weight  10/24/14 141 lb (63.957 kg)  09/25/14 140 lb (63.504 kg)  09/12/14 145 lb 15.1 oz (66.2 kg)    Blood Pressure  BP Readings from Last 3 Encounters:  10/24/14 110/74  09/25/14 86/50  09/15/14 119/84     Admit date:  (Not on file) Last encounter with RMR:  08/01/2014   Allergy Influenza vaccine live  Current Outpatient Prescriptions  Medication Sig Dispense Refill  . albuterol (VENTOLIN HFA) 108 (90 BASE) MCG/ACT inhaler Inhale 2 puffs into the lungs every 4 (four) hours as needed. Shortness of breath    . benzonatate (TESSALON) 100 MG capsule Take 1 capsule (100 mg total) by mouth 3 (three) times daily as needed for cough. 20 capsule 0  . Blood Glucose Calibration (OT ULTRA/FASTTK CNTRL SOLN) SOLN     . Blood Glucose Monitoring Suppl (ONE TOUCH ULTRA 2) W/DEVICE KIT     . budesonide-formoterol (SYMBICORT) 80-4.5 MCG/ACT inhaler Inhale 2 puffs into the lungs 2 (two) times daily. 1 Inhaler 12  . diltiazem (CARDIZEM CD) 120 MG 24 hr capsule     . edoxaban (SAVAYSA) 60 MG TABS tablet Take 60 mg by mouth daily. 30 tablet 3  . fluticasone (FLONASE) 50 MCG/ACT nasal spray Place 2 sprays into both nostrils daily.    [name] LANCETS ULTRA THIN MISC     . levalbuterol (XOPENEX) 0.63 MG/3ML nebulizer solution Take 3 mLs (0.63 mg total) by nebulization 3 (three) times daily. 3 mL 12  . LORazepam (ATIVAN) 0.5 MG tablet Take 1 tablet (0.5 mg total) by mouth 2 (two) times daily as needed for anxiety or sleep. 20 tablet 0  . metFORMIN  (GLUCOPHAGE) 500 MG tablet Take 1 tablet (500 mg total) by mouth daily with breakfast. 30 tablet 3  . nitroGLYCERIN (NITROSTAT) 0.4 MG SL tablet Place 1 tablet (0.4 mg total) under the tongue every 5 (five) minutes as needed for chest pain. 25 tablet 4  . oxyCODONE-acetaminophen (ROXICET) 5-325 MG per tablet Take 1 tablet by mouth every 4 (four) hours as needed for severe pain. 25 tablet 0  . tiotropium (SPIRIVA) 18 MCG inhalation capsule Place 18 mcg into inhaler and inhale daily.     No current facility-administered medications for this visit.    Discontinued Meds:    Medications Discontinued During This Encounter  Medication Reason  . diltiazem (CARDIZEM CD) 180 MG 24 hr capsule Dose change    Patient Active Problem List   Diagnosis Date Noted  . COPD exacerbation   . Acute on chronic respiratory failure 09/08/2014  . Hyperglycemia 09/08/2014  . A-fib 09/08/2014  . Diabetes 05/29/2014  . Pain in joint, lower leg 12/28/2013  . Muscle spasm of both lower legs 12/28/2013  . Protein-calorie malnutrition, severe 10/21/2013  . Hemoptysis 10/20/2013  . Atrial fibrillation with rapid ventricular response 07/10/2013  . CAP (community acquired pneumonia) 07/10/2013  . Shoulder  pain, left 01/20/2012  . Neck pain on left side 01/20/2012  . Rotator cuff syndrome of left shoulder 01/20/2012  . Spondylosis, cervical 11/19/2011  . Shoulder pain 11/19/2011  . Acute respiratory failure with hypoxia 06/23/2011  . Pulmonary cavitary lesion 06/23/2011  . SOB (shortness of breath) 06/22/2011  . Nausea vomiting and diarrhea 06/22/2011  . Dehydration 06/22/2011  . Cough 06/22/2011  . Decompensated COPD with exacerbation (chronic obstructive pulmonary disease) 06/22/2011  . Abdominal pain 06/22/2011  . 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 Cavitary lesion of lung 05/07/2011  . GERD with stricture   . ASCVD (arteriosclerotic cardiovascular disease)   . Type 2 diabetes mellitus  without complication   . TOBACCO ABUSE 05/17/2010  . Asthma 01/20/2008  . Hyperlipidemia 12/27/2007  . COPD (chronic obstructive pulmonary disease) 12/27/2007    LABS    Component Value Date/Time   NA 137 09/13/2014 0612   NA 135 09/10/2014 0528   NA 135 09/09/2014 0542   K 4.9 09/13/2014 0612   K 5.0 09/10/2014 0528   K 4.4 09/09/2014 0542   CL 97 09/13/2014 0612   CL 103 09/10/2014 0528   CL 104 09/09/2014 0542   CO2 33* 09/13/2014 0612   CO2 24 09/10/2014 0528   CO2 23 09/09/2014 0542   GLUCOSE 257* 09/13/2014 0612   GLUCOSE 142* 09/10/2014 0528   GLUCOSE 239* 09/09/2014 0542   BUN 21 09/13/2014 0612   BUN 28* 09/10/2014 0528   BUN 19 09/09/2014 0542   CREATININE 1.06 09/15/2014 0616   CREATININE 1.02 09/13/2014 0612   CREATININE 0.99 09/10/2014 0528   CREATININE 0.86 08/01/2014 1158   CALCIUM 8.2* 09/13/2014 0612   CALCIUM 8.6 09/10/2014 0528   CALCIUM 8.2* 09/09/2014 0542   GFRNONAA 77* 09/15/2014 0616   GFRNONAA 81* 09/13/2014 0612   GFRNONAA >90 09/10/2014 0528   GFRAA 89* 09/15/2014 0616   GFRAA >90 09/13/2014 0612   GFRAA >90 09/10/2014 0528   CMP     Component Value Date/Time   NA 137 09/13/2014 0612   K 4.9 09/13/2014 0612   CL 97 09/13/2014 0612   CO2 33* 09/13/2014 0612   GLUCOSE 257* 09/13/2014 0612   BUN 21 09/13/2014 0612   CREATININE 1.06 09/15/2014 0616   CREATININE 0.86 08/01/2014 1158   CALCIUM 8.2* 09/13/2014 0612   PROT 7.1 09/08/2014 0942   ALBUMIN 3.1* 09/08/2014 0942   AST 19 09/08/2014 0942   ALT 16 09/08/2014 0942   ALKPHOS 65 09/08/2014 0942   BILITOT 0.3 09/08/2014 0942   GFRNONAA 77* 09/15/2014 0616   GFRAA 89* 09/15/2014 0616       Component Value Date/Time   WBC 8.9 09/13/2014 0612   WBC 12.5* 09/12/2014 0405   WBC 10.0 09/11/2014 0431   HGB 12.5* 09/13/2014 0612   HGB 13.4 09/12/2014 0405   HGB 12.5* 09/11/2014 0431   HCT 38.7* 09/13/2014 0612   HCT 41.7 09/12/2014 0405   HCT 38.7* 09/11/2014 0431   MCV 81.6  09/13/2014 0612   MCV 82.7 09/12/2014 0405   MCV 81.5 09/11/2014 0431    Lipid Panel     Component Value Date/Time   CHOL 131 06/23/2011 0555   TRIG 83 06/23/2011 0555   TRIG 79 05/17/2010 0000   HDL 29* 06/23/2011 0555   CHOLHDL 4.5 06/23/2011 0555   VLDL 17 06/23/2011 0555   LDLCALC 85 06/23/2011 0555   LDLCALC 101 05/17/2010 0000    ABG    Component Value  Date/Time   PHART 7.456* 09/10/2014 2039   PCO2ART 34.6* 09/10/2014 2039   PO2ART 49.3* 09/10/2014 2039   HCO3 24.0 09/10/2014 2039   TCO2 21.2 09/10/2014 2039   ACIDBASEDEF 0.4 09/09/2014 2154   O2SAT 83.6 09/10/2014 2039     Lab Results  Component Value Date   TSH 1.215 07/10/2013   BNP (last 3 results) No results for input(s): BNP in the last 8760 hours.  ProBNP (last 3 results)  Recent Labs  10/31/13 0152  PROBNP 359.3*    Cardiac Panel (last 3 results) No results for input(s): CKTOTAL, CKMB, TROPONINI, RELINDX in the last 72 hours.  Iron/TIBC/Ferritin/ %Sat No results found for: IRON, TIBC, FERRITIN, IRONPCTSAT   EKG Orders placed or performed during the hospital encounter of 09/08/14  . EKG 12-Lead  . EKG 12-Lead  . EKG 12-Lead  . EKG 12-Lead  . EKG 12-Lead  . EKG 12-Lead  . EKG     Prior Assessment and Plan Problem List as of 10/24/2014      Cardiovascular and Mediastinum   ASCVD (arteriosclerotic cardiovascular disease)   Last Assessment & Plan 08/01/2014 Office Visit Written 08/01/2014  1:51 PM by Lendon Colonel, NP    Denies cardiac chest pain. Continue current regimen with modifications as above.      Atrial fibrillation with rapid ventricular response   Last Assessment & Plan 08/01/2014 Office Visit Written 08/01/2014  1:50 PM by Lendon Colonel, NP    Heart rate is well controlled but BP is low. I have checked it again in both arms manually. 118/68 right arm, 108/70 in the left arm.  He states he is eating and drinking but weight has been difficult to maintain. He smoked some  marijuana to stimulate his appetite. I will decrease diltiazem to 120 mg daily. I will give him samples of Savaysa and fill out patient assistance form to be submitted. Will see him again in 3 months.       A-fib     Respiratory   Asthma   Last Assessment & Plan 08/01/2014 Office Visit Written 08/01/2014  1:51 PM by Lendon Colonel, NP    This complicated by pnuemonia. I have advised him to stop smoking to avoid frequent respiratory infections. He follows Dr. Luan Pulling for pulmonary issues. I will differ any pain medication refils to Dr. Luan Pulling.       COPD (chronic obstructive pulmonary disease)   Last Assessment & Plan 06/26/2014 Office Visit Written 06/26/2014  3:50 PM by Lendon Colonel, NP    He continues on inhalers. May need to consider changing to Xopenex if HR is difficult to control.       Decompensated COPD with exacerbation (chronic obstructive pulmonary disease)   Acute respiratory failure with hypoxia   CAP (community acquired pneumonia)   Last Assessment & Plan 07/20/2013 Office Visit Written 07/20/2013  2:28 PM by Lendon Colonel, NP    Continues recovery but better each day.       Hemoptysis   Acute on chronic respiratory failure   COPD exacerbation     Digestive   GERD with stricture   Nausea vomiting and diarrhea     Endocrine   Type 2 diabetes mellitus without complication   Diabetes     Musculoskeletal and Integument   Spondylosis, cervical   Rotator cuff syndrome of left shoulder     Other   Hyperlipidemia   Last Assessment & Plan 05/24/2013 Office Visit Written 05/24/2013  2:05 PM  by Lendon Colonel, NP    He refuses labs at this time due to financial constraints. He is to find PCP as soon as possible.,      TOBACCO ABUSE   Last Assessment & Plan 05/24/2013 Office Visit Written 05/24/2013  2:04 PM by Lendon Colonel, NP    Unfortunately continues to smoke. I have strongly advised him to quit as this is significant risk factor for worsening  CAD. 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 More recommendations once his biopsy has returned.      SOB (shortness of breath)   Dehydration   Cough   Abdominal pain   Pulmonary cavitary lesion   Shoulder pain   Shoulder pain, left   Neck pain on left side   Protein-calorie malnutrition, severe   Pain in joint, lower leg   Muscle spasm of both lower legs   Hyperglycemia       Imaging: Dg Forearm Left  09/27/2014   CLINICAL DATA:  Status post fall 1 month ago with persistent mid forearm pain.  EXAM: LEFT FOREARM - 2 VIEW  COMPARISON:  None.  FINDINGS: The radius and ulna are adequately mineralized. There is no acute or healing fracture. The radial head and the olecranon are unremarkable. There is no elbow effusion. The radiocarpal and ulnocarpal joints are unremarkable. The soft tissues of the forearm are normal.  IMPRESSION: There is no acute or chronic bony abnormality of the left radius or ulna.   Electronically Signed   By: Riordan  Martinique   On: 09/27/2014 08:20   Dg Knee Complete 4 Views Right  09/27/2014   CLINICAL DATA:  Status post fall 1 month ago with persistent pain in the patellar region  EXAM: RIGHT KNEE - COMPLETE 4+ VIEW  COMPARISON:  None.  FINDINGS: The joint spaces of the right knee are preserved. There is mild beaking of the tibial spines. The patella is intact and appropriately positioned. There is no joint effusion or chondrocalcinosis. The soft tissues exhibit no evidence of edema.  IMPRESSION: There is no acute or significant chronic bony abnormality of the right knee.   Electronically Signed   By: Zak  Martinique   On: 09/27/2014 08:21   Dg Hand Complete Left  09/27/2014   CLINICAL DATA:  Status post fall 1 month ago with persistent metacarpal region  pain  EXAM: LEFT HAND - COMPLETE 3+ VIEW  COMPARISON:  None.  FINDINGS: The bones of the right hand are adequately mineralized. There is irregularity of the distal aspect of the proximal phalanx of the fourth digit. There is mild overlying soft tissue swelling. There is narrowing of the PIP joint of the fourth digit. The other interphalangeal joints are unremarkable. The metacarpals and metacarpophalangeal joints exhibit no acute abnormalities. The observed portions of the distal radius and ulna and the carpal bones are unremarkable.  IMPRESSION: There is soft tissue swelling and underlying irregularity of the distal aspect of the proximal phalanx of the left fourth finger. Correlation with symptoms here is needed. This could reflect a site of old injury with subsequent onset of degenerative change, but a subacute fracture is not excluded.   Electronically Signed   By: Dagmawi  [place]   On: 09/27/2014 08:19

## 2014-11-08 ENCOUNTER — Emergency Department (HOSPITAL_COMMUNITY): Payer: Medicare Other

## 2014-11-08 ENCOUNTER — Encounter (HOSPITAL_COMMUNITY): Payer: Self-pay | Admitting: Emergency Medicine

## 2014-11-08 ENCOUNTER — Emergency Department (HOSPITAL_COMMUNITY)
Admission: EM | Admit: 2014-11-08 | Discharge: 2014-11-08 | Disposition: A | Discharge status: Home / Self Care | Payer: Medicare Other | Attending: Emergency Medicine | Admitting: Emergency Medicine

## 2014-11-08 DIAGNOSIS — Z7952 Long term (current) use of systemic steroids: Secondary | ICD-10-CM | POA: Insufficient documentation

## 2014-11-08 DIAGNOSIS — K219 Gastro-esophageal reflux disease without esophagitis: Secondary | ICD-10-CM | POA: Insufficient documentation

## 2014-11-08 DIAGNOSIS — R079 Chest pain, unspecified: Secondary | ICD-10-CM | POA: Diagnosis not present

## 2014-11-08 DIAGNOSIS — Z87891 Personal history of nicotine dependence: Secondary | ICD-10-CM | POA: Insufficient documentation

## 2014-11-08 DIAGNOSIS — E119 Type 2 diabetes mellitus without complications: Secondary | ICD-10-CM | POA: Diagnosis not present

## 2014-11-08 DIAGNOSIS — J441 Chronic obstructive pulmonary disease with (acute) exacerbation: Secondary | ICD-10-CM | POA: Diagnosis not present

## 2014-11-08 DIAGNOSIS — Z7901 Long term (current) use of anticoagulants: Secondary | ICD-10-CM | POA: Insufficient documentation

## 2014-11-08 DIAGNOSIS — M542 Cervicalgia: Secondary | ICD-10-CM

## 2014-11-08 DIAGNOSIS — G8929 Other chronic pain: Secondary | ICD-10-CM | POA: Insufficient documentation

## 2014-11-08 DIAGNOSIS — Z79899 Other long term (current) drug therapy: Secondary | ICD-10-CM | POA: Insufficient documentation

## 2014-11-08 DIAGNOSIS — Z8701 Personal history of pneumonia (recurrent): Secondary | ICD-10-CM | POA: Diagnosis not present

## 2014-11-08 DIAGNOSIS — I2510 Atherosclerotic heart disease of native coronary artery without angina pectoris: Secondary | ICD-10-CM | POA: Insufficient documentation

## 2014-11-08 LAB — BASIC METABOLIC PANEL
ANION GAP: 9 (ref 5–15)
BUN: 10 mg/dL (ref 6–23)
CO2: 27 mmol/L (ref 19–32)
CREATININE: 1 mg/dL (ref 0.50–1.35)
Calcium: 8.7 mg/dL (ref 8.4–10.5)
Chloride: 102 mmol/L (ref 96–112)
GFR calc non Af Amer: 83 mL/min — ABNORMAL LOW (ref >90)
Glucose, Bld: 111 mg/dL — ABNORMAL HIGH (ref 70–99)
Potassium: 3.9 mmol/L (ref 3.5–5.1)
SODIUM: 138 mmol/L (ref 135–145)

## 2014-11-08 LAB — CBC
HCT: 42.3 % (ref 39.0–52.0)
HEMOGLOBIN: 13.6 g/dL (ref 13.0–17.0)
MCH: 27 pg (ref 26.0–34.0)
MCHC: 32.2 g/dL (ref 30.0–36.0)
MCV: 84.1 fL (ref 78.0–100.0)
Platelets: 230 10*3/uL (ref 150–400)
RBC: 5.03 MIL/uL (ref 4.22–5.81)
RDW: 16.3 % — ABNORMAL HIGH (ref 11.5–15.5)
WBC: 6.4 10*3/uL (ref 4.0–10.5)

## 2014-11-08 LAB — TROPONIN I: Troponin I: <0.03 ng/mL (ref <0.031)

## 2014-11-08 MED ORDER — ONDANSETRON 4 MG PO TBDP: 4.0000 mg | ORAL_TABLET | Freq: Three times a day (TID) | ORAL | Status: DC | PRN

## 2014-11-08 MED ORDER — LEVALBUTEROL HCL 0.63 MG/3ML IN NEBU
0.6300 mg | INHALATION_SOLUTION | Freq: Once | RESPIRATORY_TRACT | Status: AC
  Administered 2014-11-08: 0.63 mg via RESPIRATORY_TRACT
  Filled 2014-11-08: qty 3

## 2014-11-08 MED ORDER — MORPHINE SULFATE 4 MG/ML IJ SOLN
4.0000 mg | INTRAMUSCULAR | Status: DC | PRN
  Administered 2014-11-08: 4 mg via INTRAVENOUS
  Filled 2014-11-08: qty 1

## 2014-11-08 MED ORDER — LEVOFLOXACIN 500 MG PO TABS: 500.0000 mg | ORAL_TABLET | Freq: Every day | ORAL | Status: DC

## 2014-11-08 MED ORDER — ALBUTEROL SULFATE (2.5 MG/3ML) 0.083% IN NEBU: 2.5000 mg | INHALATION_SOLUTION | RESPIRATORY_TRACT | Status: DC | PRN

## 2014-11-08 MED ORDER — METHYLPREDNISOLONE SODIUM SUCC 125 MG IJ SOLR
125.0000 mg | Freq: Once | INTRAMUSCULAR | Status: AC
  Administered 2014-11-08: 125 mg via INTRAVENOUS
  Filled 2014-11-08: qty 2

## 2014-11-08 MED ORDER — PREDNISONE 20 MG PO TABS: 20.0000 mg | ORAL_TABLET | Freq: Two times a day (BID) | ORAL | Status: DC

## 2014-11-08 MED ORDER — ONDANSETRON HCL 4 MG/2ML IJ SOLN
4.0000 mg | Freq: Once | INTRAMUSCULAR | Status: AC
  Administered 2014-11-08: 4 mg via INTRAVENOUS
  Filled 2014-11-08: qty 2

## 2014-11-08 NOTE — ED Notes (Signed)
Pt reports chest pain x6 hours. Pt reports emesis last night. Moderate dyspnea noted in triage. Pt reports pain is worse when laying flat.

## 2014-11-08 NOTE — ED Notes (Signed)
Patient verbalizes understanding of discharge instructions, prescription medications, home care and follow up care. Patient ambulatory out of department at this time with family. 

## 2014-11-08 NOTE — Discharge Instructions (Signed)
Take Zofran for nausea prior to taking your home pain medication. Recheck with Dr. Luan Pulling as needed.  Chest Wall Pain Chest wall pain is pain in or around the bones and muscles of your chest. It may take up to 6 weeks to get better. It may take longer if you must stay physically active in your work and activities.  CAUSES  Chest wall pain may happen on its own. However, it may be caused by:  A viral illness like the flu.  Injury.  Coughing.  Exercise.  Arthritis.  Fibromyalgia.  Shingles. HOME CARE INSTRUCTIONS   Avoid overtiring physical activity. Try not to strain or perform activities that cause pain. This includes any activities using your chest or your abdominal and side muscles, especially if heavy weights are used.  Put ice on the sore area.  Put ice in a plastic bag.  Place a towel between your skin and the bag.  Leave the ice on for 15-20 minutes per hour while awake for the first 2 days.  Only take over-the-counter or prescription medicines for pain, discomfort, or fever as directed by your caregiver. SEEK IMMEDIATE MEDICAL CARE IF:   Your pain increases, or you are very uncomfortable.  You have a fever.  Your chest pain becomes worse.  You have new, unexplained symptoms.  You have nausea or vomiting.  You feel sweaty or lightheaded.  You have a cough with phlegm (sputum), or you cough up blood. MAKE SURE YOU:   Understand these instructions.  Will watch your condition.  Will get help right away if you are not doing well or get worse. Document Released: 06/30/2005 Document Revised: 09/22/2011 Document Reviewed: 02/24/2011 [facility] Patient Information 2015 [place], [place]. This information is not intended to replace advice given to you by your health care provider. Make sure you discuss any questions you have with your health care provider.  Chronic Obstructive Pulmonary Disease Exacerbation Chronic obstructive pulmonary disease (COPD) is a common  lung condition in which airflow from the lungs is limited. COPD is a general term that can be used to describe many different lung problems that limit airflow, including chronic bronchitis and emphysema. COPD exacerbations are episodes when breathing symptoms become much worse and require extra treatment. Without treatment, COPD exacerbations can be life threatening, and frequent COPD exacerbations can cause further damage to your lungs. CAUSES   Respiratory infections.   Exposure to smoke.   Exposure to air pollution, chemical fumes, or dust. Sometimes there is no apparent cause or trigger. RISK FACTORS  Smoking cigarettes.  Older age.  Frequent prior COPD exacerbations. SIGNS AND SYMPTOMS   Increased coughing.   Increased thick spit (sputum) production.   Increased wheezing.   Increased shortness of breath.   Rapid breathing.   Chest tightness. DIAGNOSIS  Your medical history, a physical exam, and tests will help your health care provider make a diagnosis. Tests may include:  A chest X-ray.  Basic lab tests.  Sputum testing.  An arterial blood gas test. TREATMENT  Depending on the severity of your COPD exacerbation, you may need to be admitted to a hospital for treatment. Some of the treatments commonly used to treat COPD exacerbations are:   Antibiotic medicines.   Bronchodilators. These are drugs that expand the air passages. They may be given with an inhaler or nebulizer. Spacer devices may be needed to help improve drug delivery.  Corticosteroid medicines.  Supplemental oxygen therapy.  HOME CARE INSTRUCTIONS   Do not smoke. Quitting smoking is  very important to prevent COPD from getting worse and exacerbations from happening as often.  Avoid exposure to all substances that irritate the airway, especially to tobacco smoke.   If you were prescribed an antibiotic medicine, finish it all even if you start to feel better.  Take all medicines as  directed by your health care provider.It is important to use correct technique with inhaled medicines.  Drink enough fluids to keep your urine clear or pale yellow (unless you have a medical condition that requires fluid restriction).  Use a cool mist vaporizer. This makes it easier to clear your chest when you cough.   If you have a home nebulizer and oxygen, continue to use them as directed.   Maintain all necessary vaccinations to prevent infections.   Exercise regularly.   Eat a healthy diet.   Keep all follow-up appointments as directed by your health care provider. SEEK IMMEDIATE MEDICAL CARE IF:  You have worsening shortness of breath.   You have trouble talking.   You have severe chest pain.  You have blood in your sputum.  You have a fever.  You have weakness, vomit repeatedly, or faint.   You feel confused.   You continue to get worse. MAKE SURE YOU:   Understand these instructions.  Will watch your condition.  Will get help right away if you are not doing well or get worse. Document Released: 04/27/2007 Document Revised: 11/14/2013 Document Reviewed: 03/04/2013 [facility] Patient Information 2015 [place], [place]. This information is not intended to replace advice given to you by your health care provider. Make sure you discuss any questions you have with your health care provider.  Musculoskeletal Pain Musculoskeletal pain is muscle and boney aches and pains. These pains can occur in any part of the body. Your caregiver may treat you without knowing the cause of the pain. They may treat you if blood or urine tests, X-rays, and other tests were normal.  CAUSES There is often not a definite cause or reason for these pains. These pains may be caused by a type of germ (virus). The discomfort may also come from overuse. Overuse includes working out too hard when your body is not fit. Boney aches also come from weather changes. Bone is sensitive to  atmospheric pressure changes. HOME CARE INSTRUCTIONS   Ask when your test results will be ready. Make sure you get your test results.  Only take over-the-counter or prescription medicines for pain, discomfort, or fever as directed by your caregiver. If you were given medications for your condition, do not drive, operate machinery or power tools, or sign legal documents for 24 hours. Do not drink alcohol. Do not take sleeping pills or other medications that may interfere with treatment.  Continue all activities unless the activities cause more pain. When the pain lessens, slowly resume normal activities. Gradually increase the intensity and duration of the activities or exercise.  During periods of severe pain, bed rest may be helpful. Lay or sit in any position that is comfortable.  Putting ice on the injured area.  Put ice in a bag.  Place a towel between your skin and the bag.  Leave the ice on for 15 to 20 minutes, 3 to 4 times a day.  Follow up with your caregiver for continued problems and no reason can be found for the pain. If the pain becomes worse or does not go away, it may be necessary to repeat tests or do additional testing. Your caregiver may need  to look further for a possible cause. SEEK IMMEDIATE MEDICAL CARE IF:  You have pain that is getting worse and is not relieved by medications.  You develop chest pain that is associated with shortness or breath, sweating, feeling sick to your stomach (nauseous), or throw up (vomit).  Your pain becomes localized to the abdomen.  You develop any new symptoms that seem different or that concern you. MAKE SURE YOU:   Understand these instructions.  Will watch your condition.  Will get help right away if you are not doing well or get worse. Document Released: 06/30/2005 Document Revised: 09/22/2011 Document Reviewed: 03/04/2013 [facility] Patient Information 2015 [place], [place]. This information is not intended to replace advice  given to you by your health care provider. Make sure you discuss any questions you have with your health care provider.

## 2014-11-08 NOTE — ED Provider Notes (Signed)
CSN: [id number]     Arrival date & time 11/08/14  1836 History   First MD Initiated Contact with Patient 11/08/14 1844     Chief Complaint  Patient presents with  . Chest Pain      HPI  Vision presents evaluation of right shoulder pain, left chest pain. He states that he was having some pain in his right shoulder for the last several days. Seen by Dr. Luan Pulling yesterday. Diagnosis of musculoskeletal pain and states he has another follow-up appointment for "considering a cortisone shot". He was given oxycodone to take at home. He took a dose last night before eating a started having some vomiting and still vomited several times. He states had some pain in his left chest and leaning over his toilet. This seemed to go away. However last 6 hours and similar pain in the left side of his chest sharp tender presents here.  History of COPD. History of paroxysmal A. fib. Heart cath '07 showed nonocclusive disease. History of PE. Anticoagulated for his A. fib with Savaysa.    As had more of a cough yesterday and today. No sputum. No shortness of breath above baseline.  Past Medical History  Diagnosis Date  . COPD (chronic obstructive pulmonary disease)   . Hypercholesteremia   . History of pneumonia   . Polycythemia   . Coronary atherosclerosis of native coronary artery     Mild nonobstructive disease at catheterization 2007  . 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 Borderline diabetes   . Seizures     Last seizure 2 yrs ago  . GERD (gastroesophageal reflux disease)   . Type 2 diabetes mellitus   . DDD (degenerative disc disease)     Cervical and thoracic  . Chronic back pain   . Bronchitis   . Chronic left shoulder pain   . Chronic neck pain   . Atrial fibrillation     Not anticoagulated  . Smoker   . Type 2 diabetes mellitus without complication   . CAP (community acquired pneumonia) 07/10/2013   Past Surgical History  Procedure Laterality Date    . Colonoscopy w/ endoscopic [place]    . Vasectomy    . Throat biopsy    . Lung biopsy    . Vasectomy  1987   Family History  Problem Relation Age of Onset  . Hypertension Mother   . Diabetes Mother   . Heart attack Mother   . Heart failure Mother   . Hypertension Sister   . Diabetes Sister   . Heart failure Sister    History  Substance Use Topics  . Smoking status: Former Smoker -- 0.50 packs/day for 45 years    Types: Cigarettes    Start date: 12/07/1965    Quit date: 09/05/2014  . Smokeless tobacco: Never Used     Comment: started smoking half pack 2 days ago per stopped in the hospital as of 07-20-2013  . Alcohol Use: No    Review of Systems  Constitutional: Negative for fever, chills, diaphoresis, appetite change and fatigue.  HENT: Negative for mouth sores, sore throat and trouble swallowing.   Eyes: Negative for visual disturbance.  Respiratory: Negative for cough, chest tightness, shortness of breath and wheezing.   Cardiovascular: Positive for chest pain.  Gastrointestinal: Negative for nausea, vomiting, abdominal pain, diarrhea and abdominal distention.  Endocrine: Negative for polydipsia, polyphagia and polyuria.  Genitourinary: Negative for dysuria, frequency and hematuria.  Musculoskeletal:  Negative for gait problem.  Skin: Negative for color change, pallor and rash.  Neurological: Negative for dizziness, syncope, light-headedness and headaches.  Hematological: Does not bruise/bleed easily.  Psychiatric/Behavioral: Negative for behavioral problems and confusion.      Allergies  Influenza vaccine live  Home Medications   Prior to Admission medications   Medication Sig Start Date End Date Taking? Authorizing Provider  albuterol (VENTOLIN HFA) 108 (90 BASE) MCG/ACT inhaler Inhale 2 puffs into the lungs every 4 (four) hours as needed. Shortness of breath   Yes Historical Provider, MD  benzonatate (TESSALON) 100 MG capsule Take 1 capsule (100 mg total) by mouth  3 (three) times daily as needed for cough. 09/15/14  Yes Rexene Alberts, MD  budesonide-formoterol (SYMBICORT) 80-4.5 MCG/ACT inhaler Inhale 2 puffs into the lungs 2 (two) times daily. 10/21/13  Yes Charlynne Cousins, MD  diltiazem (CARDIZEM CD) 120 MG 24 hr capsule Take 120 mg by mouth daily.  08/01/14  Yes Historical Provider, MD  edoxaban (SAVAYSA) 60 MG TABS tablet Take 60 mg by mouth daily. 06/26/14  Yes Lendon Colonel, NP  fluticasone (FLONASE) 50 MCG/ACT nasal spray Place 2 sprays into both nostrils daily.   Yes Historical Provider, MD  GuaiFENesin (MUCUS RELIEF ADULT PO) Take 1 tablet by mouth every 6 (six) hours.   Yes Historical Provider, MD  levalbuterol (XOPENEX) 0.63 MG/3ML nebulizer solution Take 3 mLs (0.63 mg total) by nebulization 3 (three) times daily. 09/15/14  Yes Rexene Alberts, MD  LORazepam (ATIVAN) 0.5 MG tablet Take 1 tablet (0.5 mg total) by mouth 2 (two) times daily as needed for anxiety or sleep. 09/15/14  Yes Rexene Alberts, MD  metFORMIN (GLUCOPHAGE) 500 MG tablet Take 1 tablet (500 mg total) by mouth daily with breakfast. 09/15/14  Yes Rexene Alberts, MD  nitroGLYCERIN (NITROSTAT) 0.4 MG SL tablet Place 1 tablet (0.4 mg total) under the tongue every 5 (five) minutes as needed for chest pain. 04/22/13  Yes Lendon Colonel, NP  oxyCODONE-acetaminophen (ROXICET) 5-325 MG per tablet Take 1 tablet by mouth every 4 (four) hours as needed for severe pain. 09/15/14  Yes Rexene Alberts, MD  tiotropium (SPIRIVA) 18 MCG inhalation capsule Place 18 mcg into inhaler and inhale daily.   Yes Historical Provider, MD  UNKNOWN TO PATIENT Take 1 tablet by mouth at bedtime. OTC medication for leg cramps given as a sample   Yes Historical Provider, MD  levofloxacin (LEVAQUIN) 500 MG tablet Take 1 tablet (500 mg total) by mouth daily. 11/08/14   Tanna Furry, MD  ondansetron (ZOFRAN ODT) 4 MG disintegrating tablet Take 1 tablet (4 mg total) by mouth every 8 (eight) hours as needed for nausea. 11/08/14    Tanna Furry, MD  predniSONE (DELTASONE) 20 MG tablet Take 1 tablet (20 mg total) by mouth 2 (two) times daily with a meal. 11/08/14   Tanna Furry, MD   BP 122/76 mmHg  Pulse 73  Temp(Src) 98.3 F (36.8 C) (Oral)  Resp 22  Ht '5\' 8"'$  (1.727 m)  Wt 140 lb (63.504 kg)  BMI 21.29 kg/m2  SpO2 95% Physical Exam  Constitutional: He is oriented to person, place, and time. He appears well-developed and well-nourished. No distress.  HENT:  Head: Normocephalic.  Eyes: Conjunctivae are normal. Pupils are equal, round, and reactive to light. No scleral icterus.  Neck: Normal range of motion. Neck supple. No thyromegaly present.  Cardiovascular: Normal rate and regular rhythm.  Exam reveals no gallop and no friction rub.  No murmur heard. Pulmonary/Chest: Effort normal and breath sounds normal. No respiratory distress. He has no wheezes. He has no rales.    Globally diminished breath sounds. No prolongation.  Abdominal: Soft. Bowel sounds are normal. He exhibits no distension. There is no tenderness. There is no rebound.  Musculoskeletal: Normal range of motion.       Back:  Neurological: He is alert and oriented to person, place, and time.  Skin: Skin is warm and dry. No rash noted.  Psychiatric: He has a normal mood and affect. His behavior is normal.    ED Course  Procedures (including critical care time) Labs Review Labs Reviewed  CBC - Abnormal; Notable for the following:    RDW 16.3 (*)    All other components within normal limits  BASIC METABOLIC PANEL - Abnormal; Notable for the following:    Glucose, Bld 111 (*)    GFR calc non Af Amer 83 (*)    All other components within normal limits  TROPONIN I    Imaging Review Dg Chest Port 1 View  11/08/2014   CLINICAL DATA:  54 year old male with central chest pain for the past 6 hr  EXAM: PORTABLE CHEST - 1 VIEW  COMPARISON:  Prior chest x-ray 09/10/2014 ; prior chest x-ray 07/15/2014  FINDINGS: Stable cardiac and mediastinal  contours. Similar appearance of chronic atelectasis versus pleural-parenchymal scarring in both lower lobes over multiple prior studies dating back to at least January of 2016. Cavitary mass in the periphery of the left upper lobe better demonstrated on prior CT imaging background of severe emphysema and central bronchitic change similar compared to prior. No definite new focal airspace consolidation. No pleural effusion or pneumothorax. No acute osseous abnormality.  IMPRESSION: 1. No acute cardiopulmonary process. 2. Stable chronic pulmonary parenchymal changes over several prior studies.   Electronically Signed   By: Jacqulynn Cadet M.D.   On: 11/08/2014 19:00     EKG Interpretation   Date/Time:  Wednesday November 08 2014 18:38:12 EDT Ventricular Rate:  90 PR Interval:  92 QRS Duration: 72 QT Interval:  332 QTC Calculation: 406 R Axis:   29 Text Interpretation:  Sinus rhythm with short PR Otherwise normal ECG  Confirmed by Jeneen Rinks  MD, [place] ([id number]) on 11/08/2014 6:46:00 PM      MDM   Final diagnoses:  Chest pain, unspecified chest pain type  COPD exacerbation  Musculoskeletal neck pain    Patient's EKG is unchanged. Pain is atypical sharp reproduce to palpate. Does not resemble angina. His catheter no 7 showed nonocclusive disease. Normal troponin here after 6 hours of symptoms. Symptoms seem started last night after some episodes of vomiting after taking some oxycodone for his muscular skeletal right shoulder pain. It reassuring studies here his pain is well-controlled after one dose of pain medication. Plan is home. Zofran and food prior to any pain medications. Continue his follow-up with his primary care physician regarding his musculoskeletal shoulder pain.    Tanna Furry, MD 11/08/14 2014

## 2014-12-13 ENCOUNTER — Ambulatory Visit (HOSPITAL_COMMUNITY): Payer: Medicare Other | Attending: Anesthesiology | Admitting: Physical Therapy

## 2014-12-13 DIAGNOSIS — M546 Pain in thoracic spine: Secondary | ICD-10-CM

## 2014-12-13 DIAGNOSIS — M545 Low back pain: Secondary | ICD-10-CM | POA: Diagnosis not present

## 2014-12-13 DIAGNOSIS — M2569 Stiffness of other specified joint, not elsewhere classified: Secondary | ICD-10-CM

## 2014-12-13 DIAGNOSIS — R29898 Other symptoms and signs involving the musculoskeletal system: Secondary | ICD-10-CM | POA: Insufficient documentation

## 2014-12-13 DIAGNOSIS — R2689 Other abnormalities of gait and mobility: Secondary | ICD-10-CM

## 2014-12-13 DIAGNOSIS — Z7409 Other reduced mobility: Secondary | ICD-10-CM | POA: Insufficient documentation

## 2014-12-13 DIAGNOSIS — M2560 Stiffness of unspecified joint, not elsewhere classified: Secondary | ICD-10-CM
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
## 2014-12-19 ENCOUNTER — Ambulatory Visit (HOSPITAL_COMMUNITY): Payer: Medicare Other | Admitting: Physical Therapy

## 2014-12-26 ENCOUNTER — Encounter (HOSPITAL_COMMUNITY): Payer: Medicare Other | Admitting: Physical Therapy

## 2015-01-03 ENCOUNTER — Encounter (HOSPITAL_COMMUNITY): Payer: Medicare Other | Admitting: Physical Therapy

## 2015-01-07 IMAGING — CR DG CHEST 2V
2 series · 2 of 2 positions shown · non-contrast
Comparison: 07/13/2013

CLINICAL DATA: Cough, chest pain, shortness of Breath

EXAM:
CHEST  2 VIEW

[view not recorded (1 of 2)]
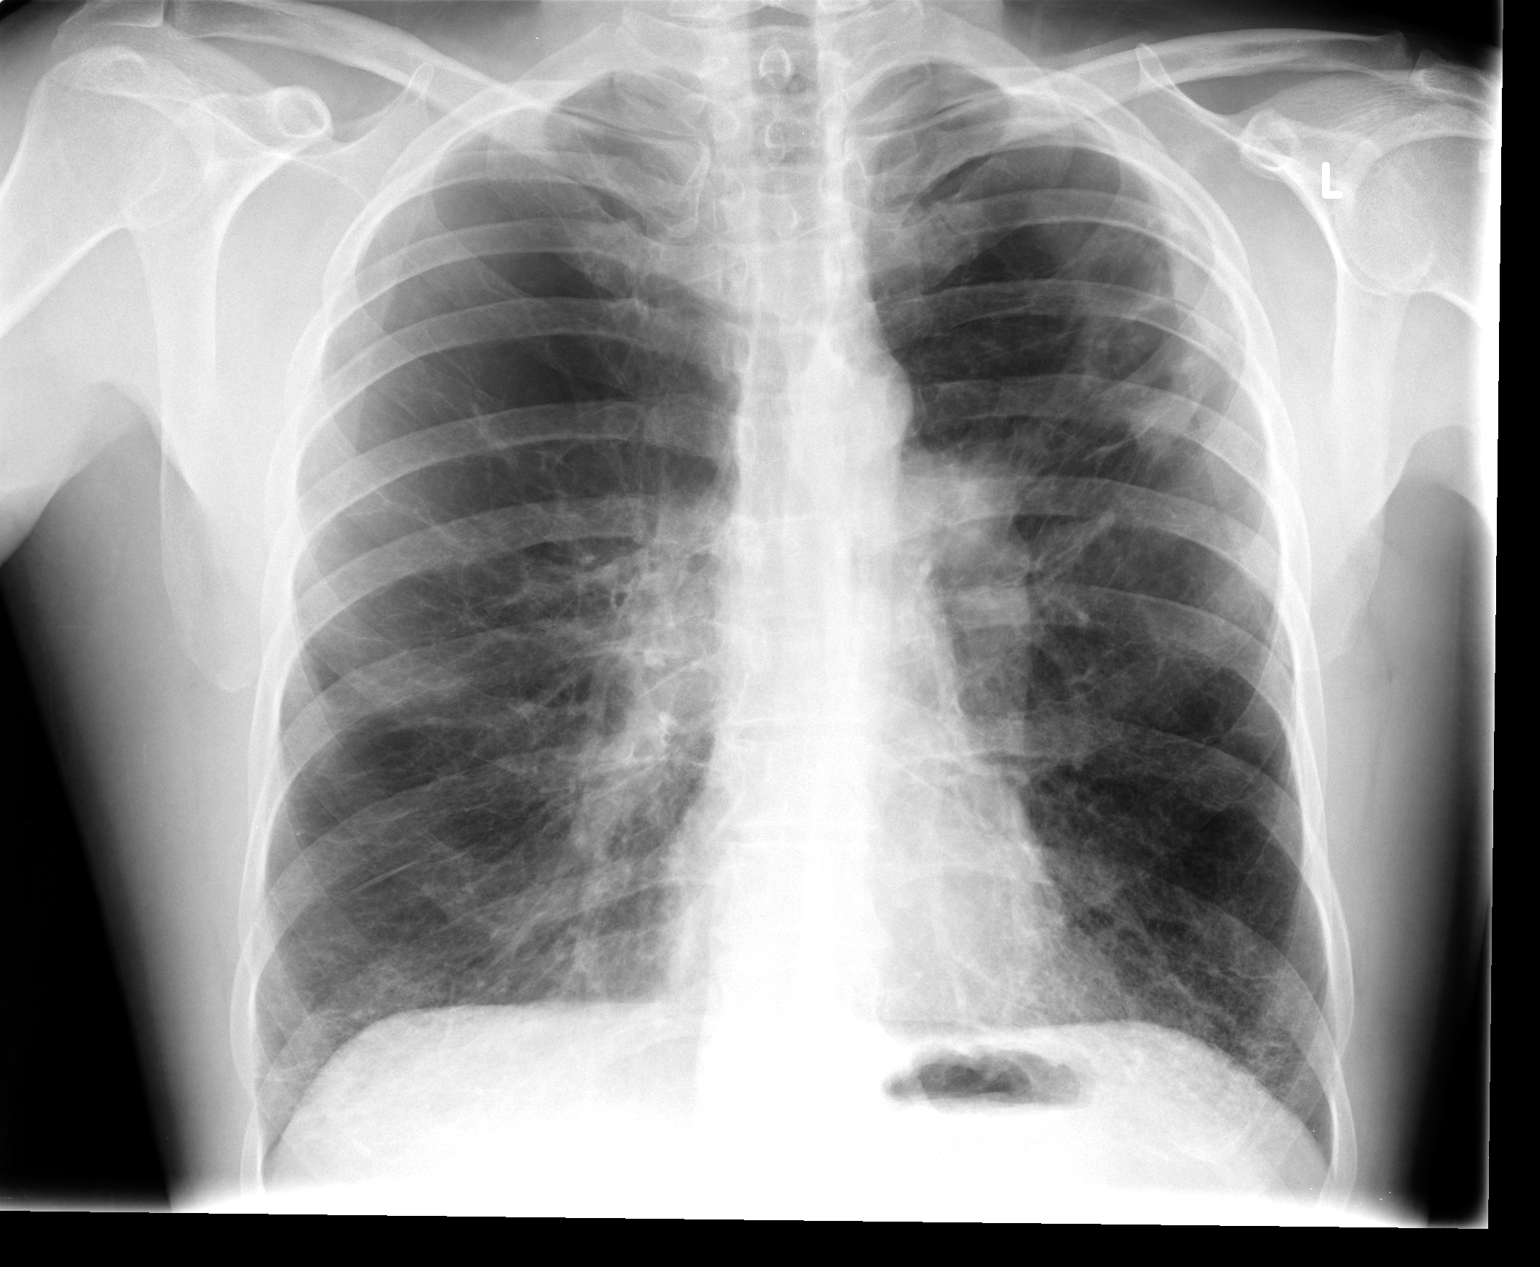

[view not recorded (2 of 2)]
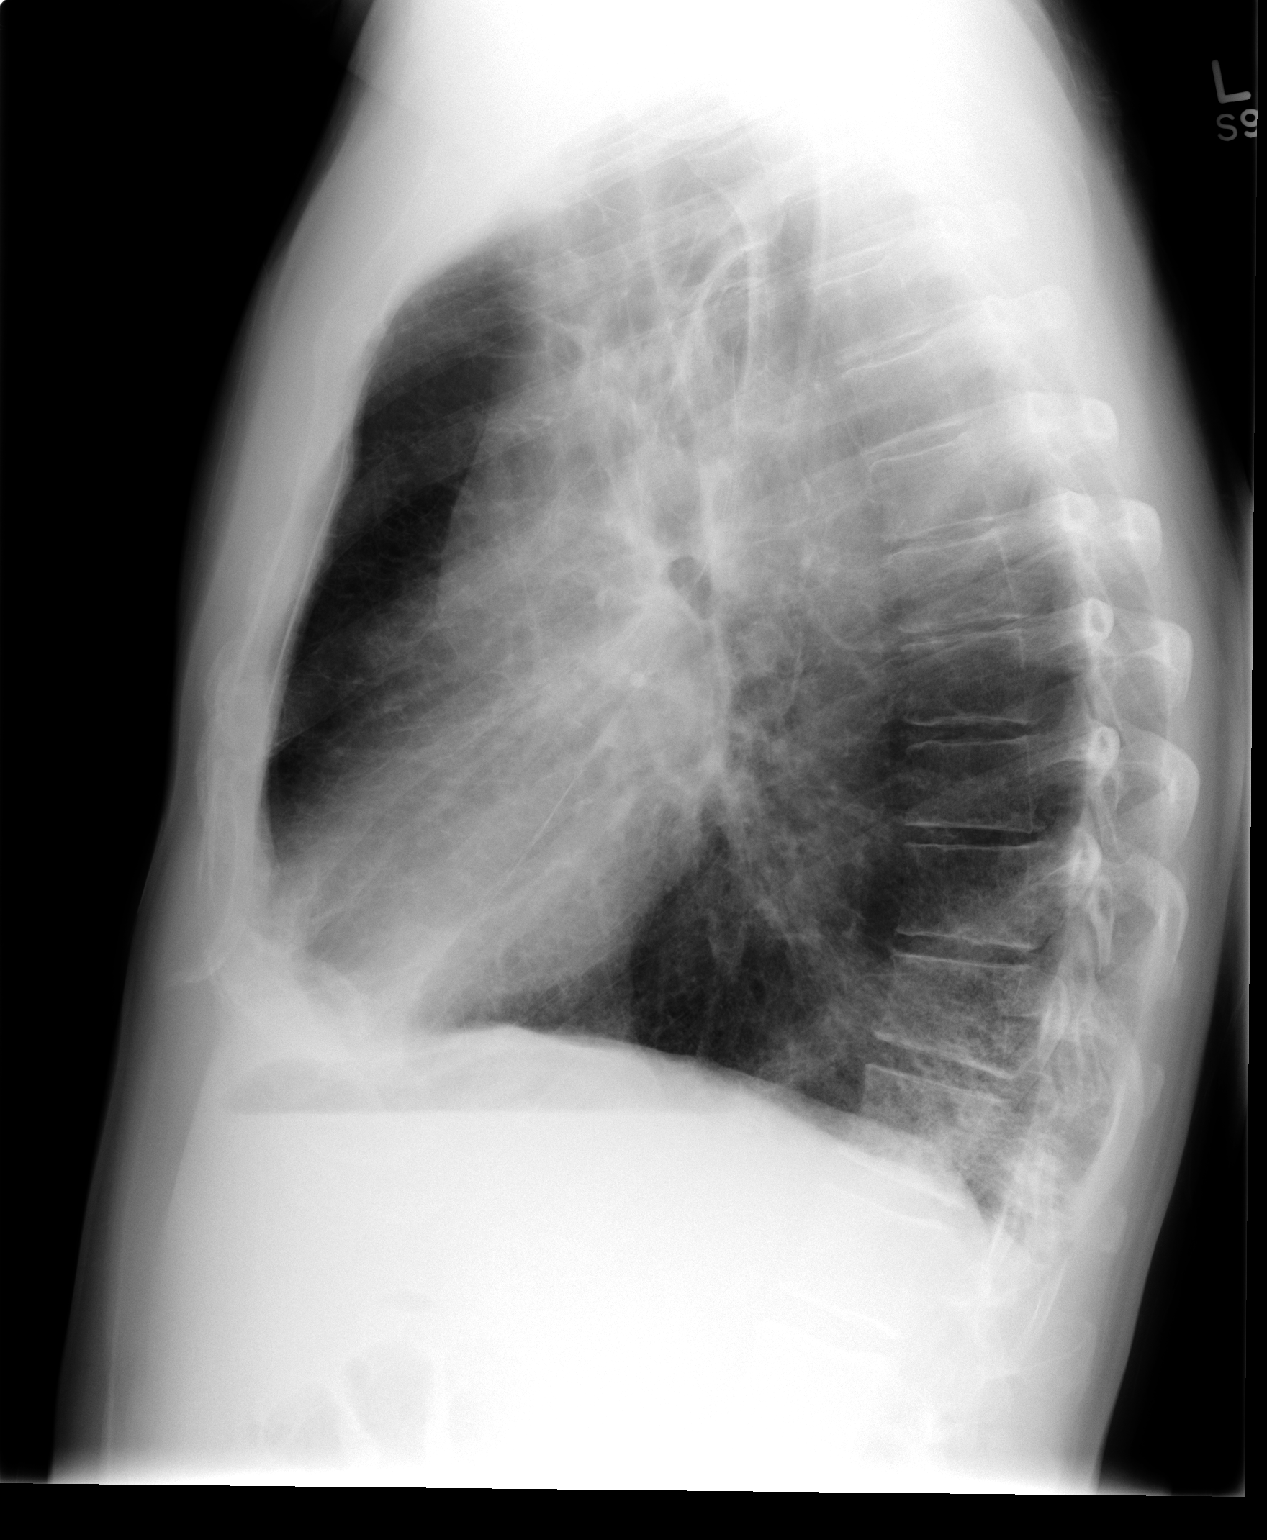

[2 of 2 positions shown; findings below may reference images not displayed]

FINDINGS: Cardiomediastinal silhouette is stable. Mild hyperinflation. Stable
cavitary lesion in left upper lobe measures 3 cm. There might be a
second cavitary lesion in left upper lobe measures 1.9 cm. No acute
infiltrate or pulmonary edema. Stable hyperinflation and chronic
mild interstitial prominence
IMPRESSION: Mild hyperinflation. Stable cavitary lesion in left upper lobe
measures 3 cm. There might be a second cavitary lesion in left upper
lobe measures 1.9 cm. No acute infiltrate or pulmonary edema. Stable
hyperinflation and chronic mild interstitial prominence

## 2015-01-07 IMAGING — CT CT ANGIO CHEST
1 of 6 series · 5 of 36 positions shown · IV contrast (Omnipaque 300)
Comparison: Chest CT June 28, 2013; chest radiograph October 20, 2013

CLINICAL DATA: Shortness of Breath

EXAM:
CT ANGIOGRAPHY CHEST WITH CONTRAST
TECHNIQUE: Multidetector CT imaging of the chest was performed using the
standard protocol during bolus administration of intravenous
contrast. Multiplanar CT image reconstructions and MIPs were
obtained to evaluate the vascular anatomy.
CONTRAST:  100mL OMNIPAQUE IOHEXOL 350 MG/ML SOLN

[Series 4: pe 3.0 b40f · axial · 0.65mm/px · z∈[-264,-72]mm · 5 of 96 slices shown]
[im 16/96  lung]
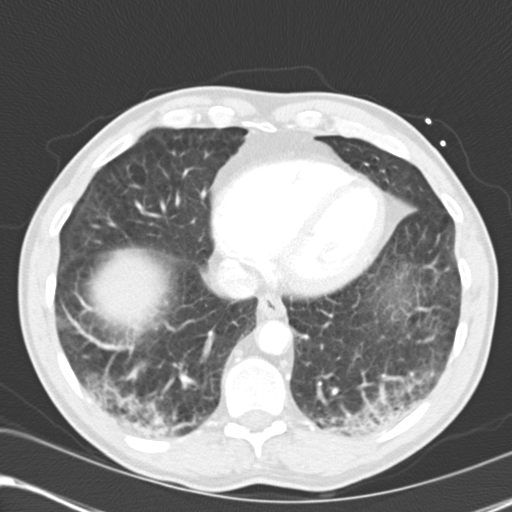
[im 32/96  mediastinal]
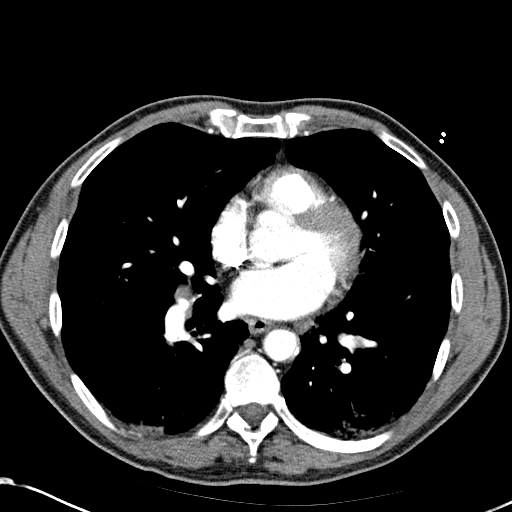
[im 48/96  lung]
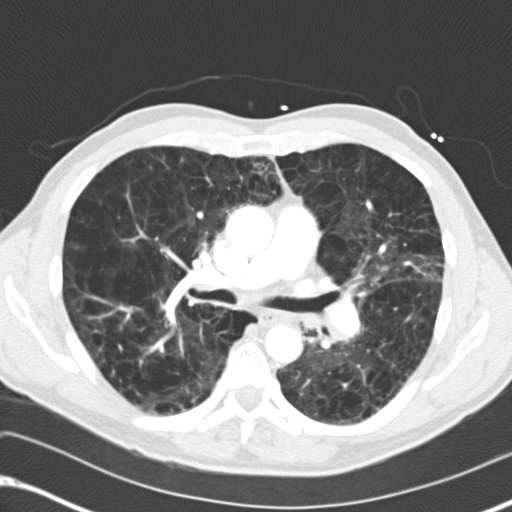
[im 64/96  mediastinal]
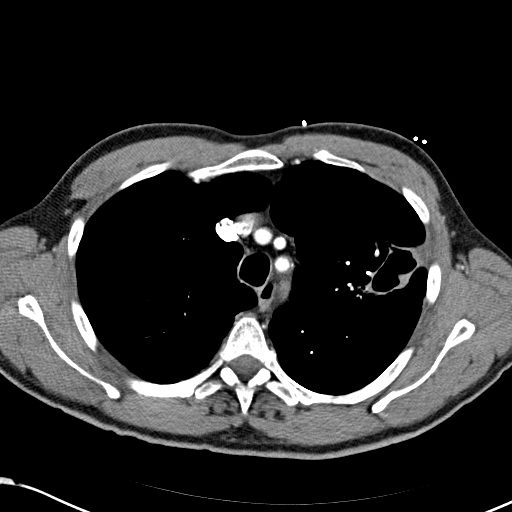
[im 80/96  lung]
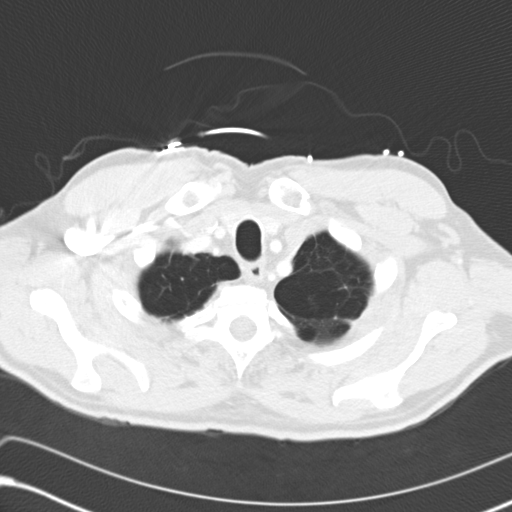

[5 of 36 positions shown; findings below may reference images not displayed]

FINDINGS: There is no demonstrable pulmonary embolus. There is no thoracic
aortic aneurysm or dissection.

Extensive underlying emphysematous change is stable. The previously
noted thick-walled cavitary lesion in the posterior segment of the
left upper lobe is again noted. Currently, it measures 4.6 x 2.6 cm,
unchanged from prior study. Nodularity within this cavitary lesion
also remains stable in appearance. There is a stable 7 mm nodular
opacity in the posterior lingula as well. This small lesion is not
cavitated.

There is stable interstitial fibrosis in both lung bases. There is
no new fibrosis. There is again noted cicatrization with
bronchiectasis in the left upper lobe which is stable.

There is a stable mildly prominent lymph node in the superior left
hilum measuring 1.7 x 1.4 cm. In the mid left hilum, there is a
stable lymph node measuring 1.5 x 1.0 cm. In the sub- carinal
region, there is a mildly prominent lymph node measuring 1.9 x
cm, stable. In the right hilum, there is a mildly prominent lymph
node measuring 1.6 x 1.2 cm. There is no new lymph node prominence
on this study.

Pericardium is not thickened. There are foci of coronary artery
calcification, noted previously.

In the visualized upper abdomen, there is fatty change in the liver.
Visualized upper abdominal structures otherwise appear normal. There
are no blastic or lytic bone lesions. Thyroid appears normal.

Review of the MIP images confirms the above findings.
IMPRESSION: No demonstrable pulmonary embolus. Extensive emphysematous change
remains with interstitial fibrosis in the lung bases, stable. The
cavitary lesion remains in the left upper lobe without appreciable
change as does a 7 mm nodular opacity in the posterior inferior
lingula. There is no new lung opacity. Mild adenopathy remains
without change. No new lesion identified.

## 2015-01-10 ENCOUNTER — Encounter (HOSPITAL_COMMUNITY): Payer: Medicare Other | Admitting: Physical Therapy

## 2015-01-17 ENCOUNTER — Encounter (HOSPITAL_COMMUNITY): Payer: Medicare Other | Admitting: Physical Therapy

## 2015-01-17 NOTE — Therapy (Signed)
[facility] [place], [place], [address] Phone: [phone]   Fax:  [phone]  Patient Details  Name: Ryan [name] MRN: [id number] Date of Birth: [birth year] Referring Provider:  Dorene Ar, MD  Encounter Date: 12/13/2014   PHYSICAL THERAPY DISCHARGE SUMMARY  Visits from Start of Care: 1  Current functional level related to goals / functional outcomes: unknown   Remaining deficits: unknown   Education / Equipment: HEP  Plan: Patient agrees to discharge.  Patient goals were not met. Patient is being discharged due to not returning since the last visit.  ?????    Rayetta Humphrey, [place] CLT [phone] 01/17/2015, 3:16 PM  [facility] [address] [place], [place], [address] Phone: [phone]   Fax:  [phone]

## 2015-01-18 IMAGING — CR DG CHEST 2V
2 series · 2 of 2 positions shown · non-contrast
Comparison: CT ANGIO CHEST W/CM &/OR WO/CM dated 10/20/2013; DG
CHEST 2 VIEW dated 10/20/2013

CLINICAL DATA: Shortness of breath worsening over several days.
Back pain. Every day smoker.

EXAM:
CHEST  2 VIEW

[view not recorded (1 of 2)]
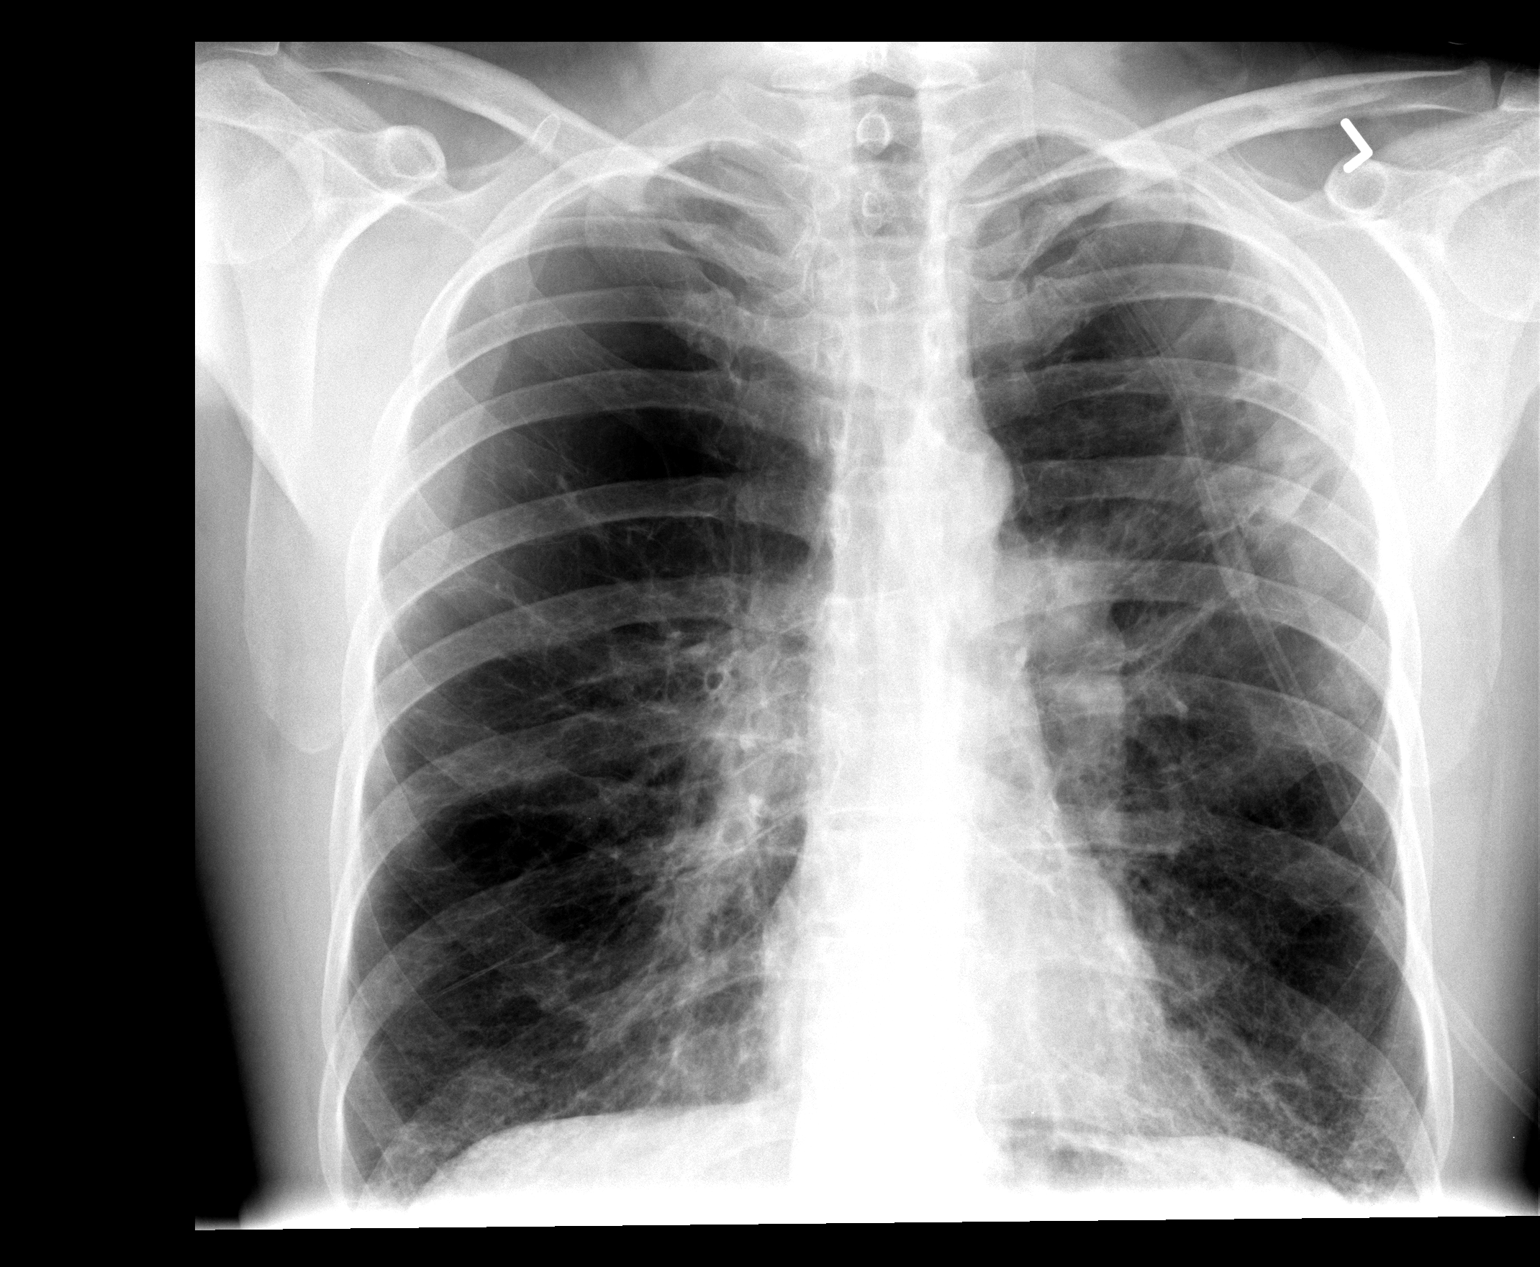

[view not recorded (2 of 2)]
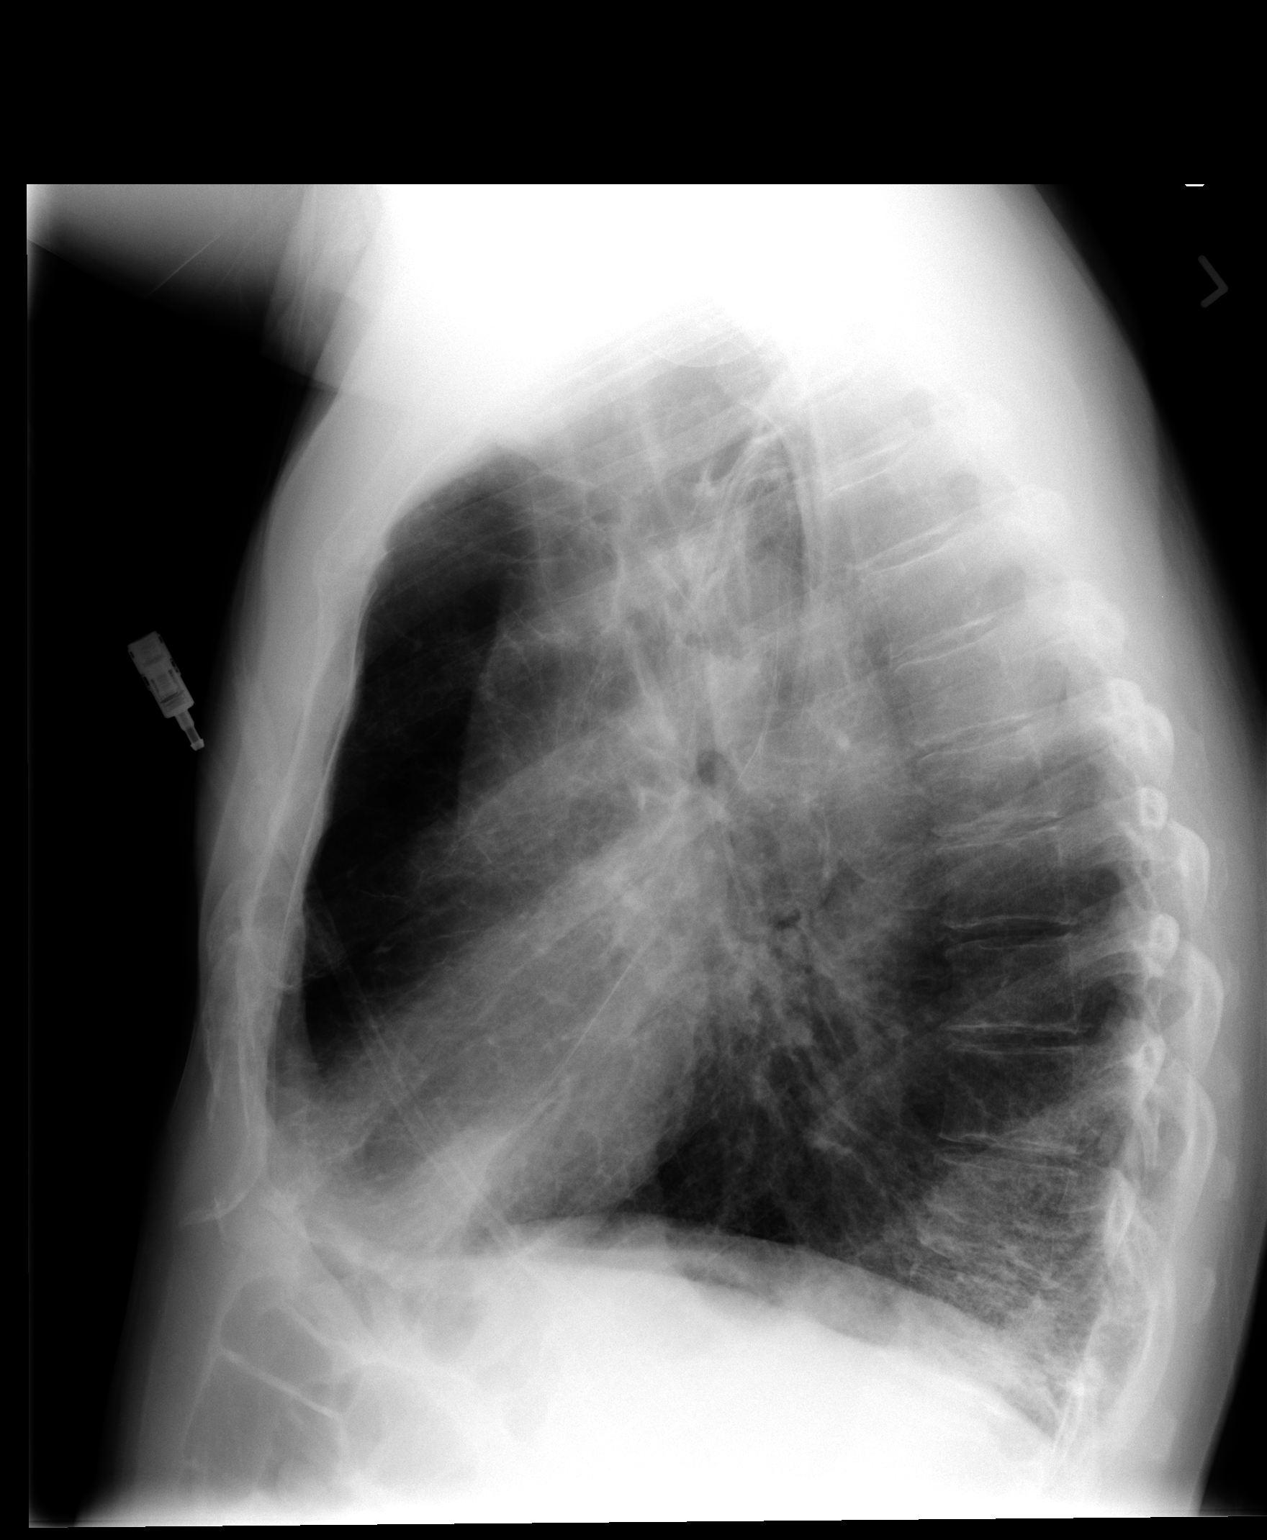

[2 of 2 positions shown; findings below may reference images not displayed]

FINDINGS: Normal heart size and pulmonary vascularity. Emphysematous changes
in the lungs with fibrosis in the periphery and bases. Prominence
scarring in the left upper lung with retraction left hilum. This
corresponds to a cavitary mass on the recent CT. No significant
change in appearance since prior studies.
IMPRESSION: Stable appearance of the chest since previous studies. No acute
infiltration. Chronic emphysema, fibrosis, and cavitary lesions in
the left upper lung with left hilar retraction and scarring.

## 2015-03-04 IMAGING — CR DG CHEST 2V
2 series · 2 of 2 positions shown · non-contrast
Comparison: 10/31/2013

CLINICAL DATA: Shortness of breath

EXAM:
CHEST  2 VIEW

[view not recorded (1 of 2)]
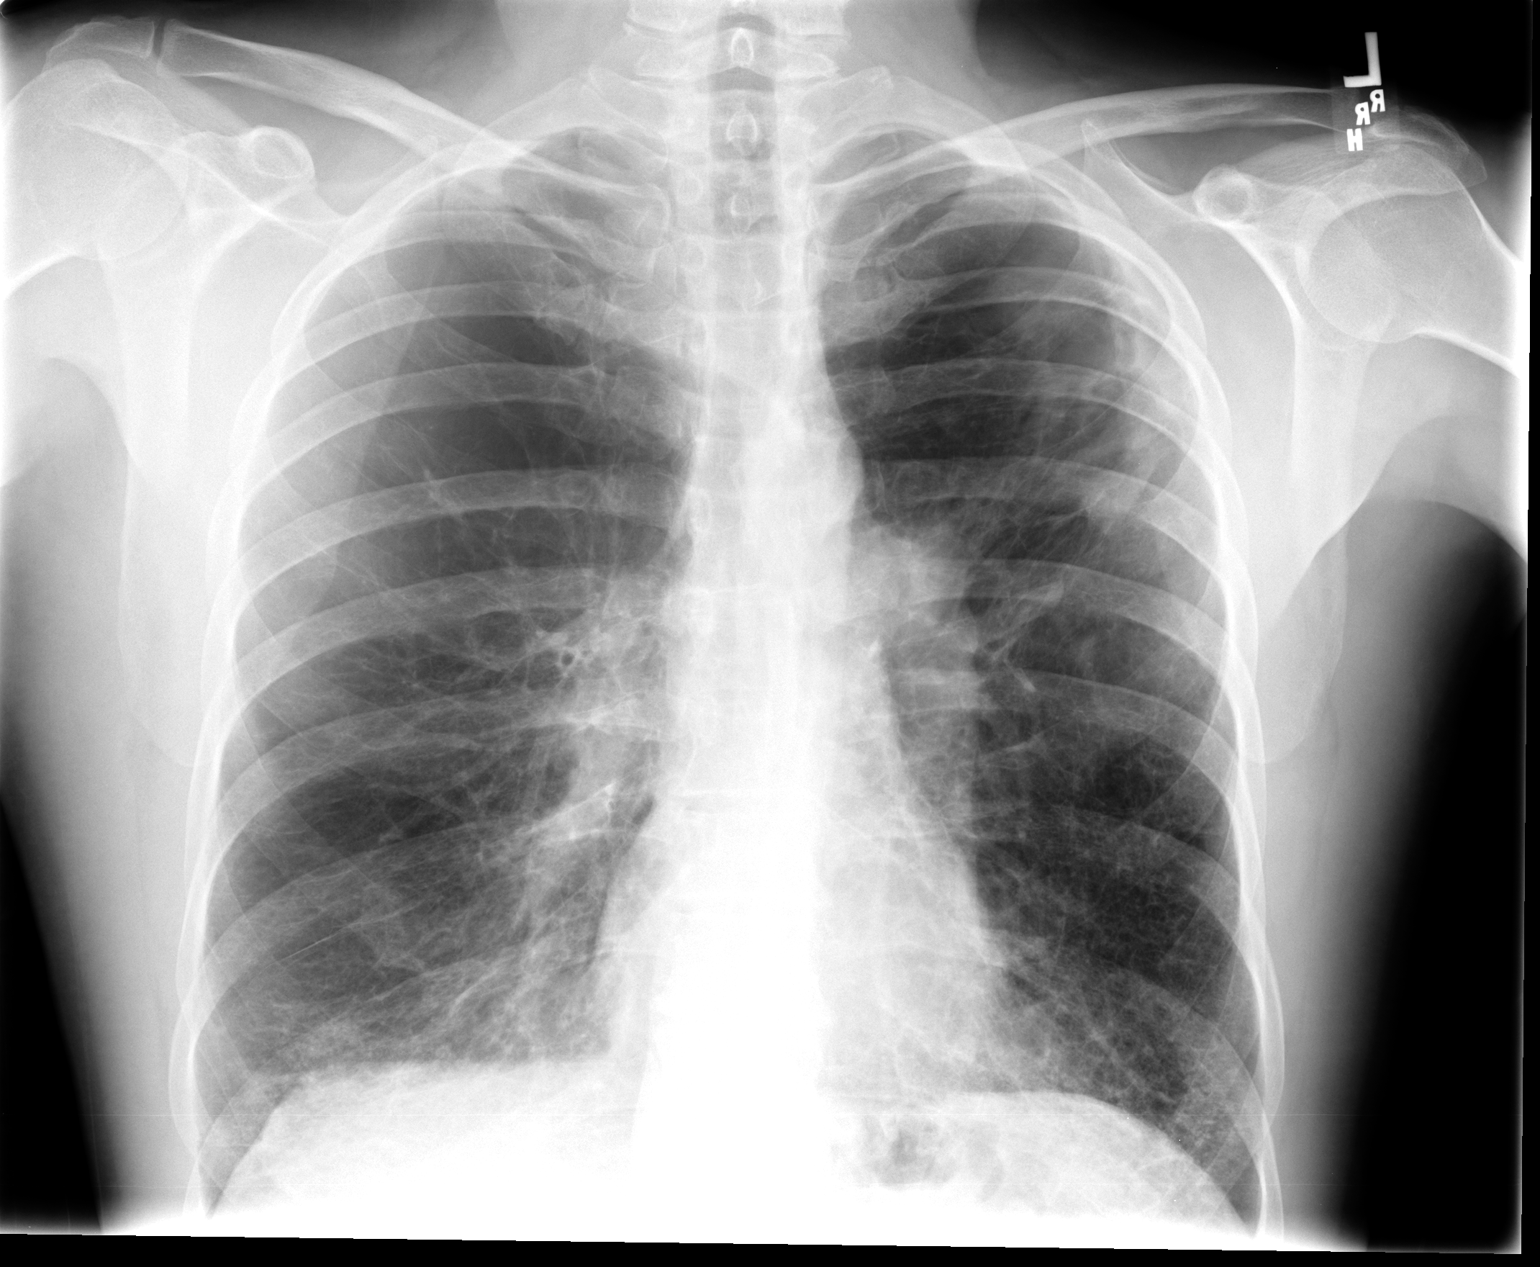

[view not recorded (2 of 2)]
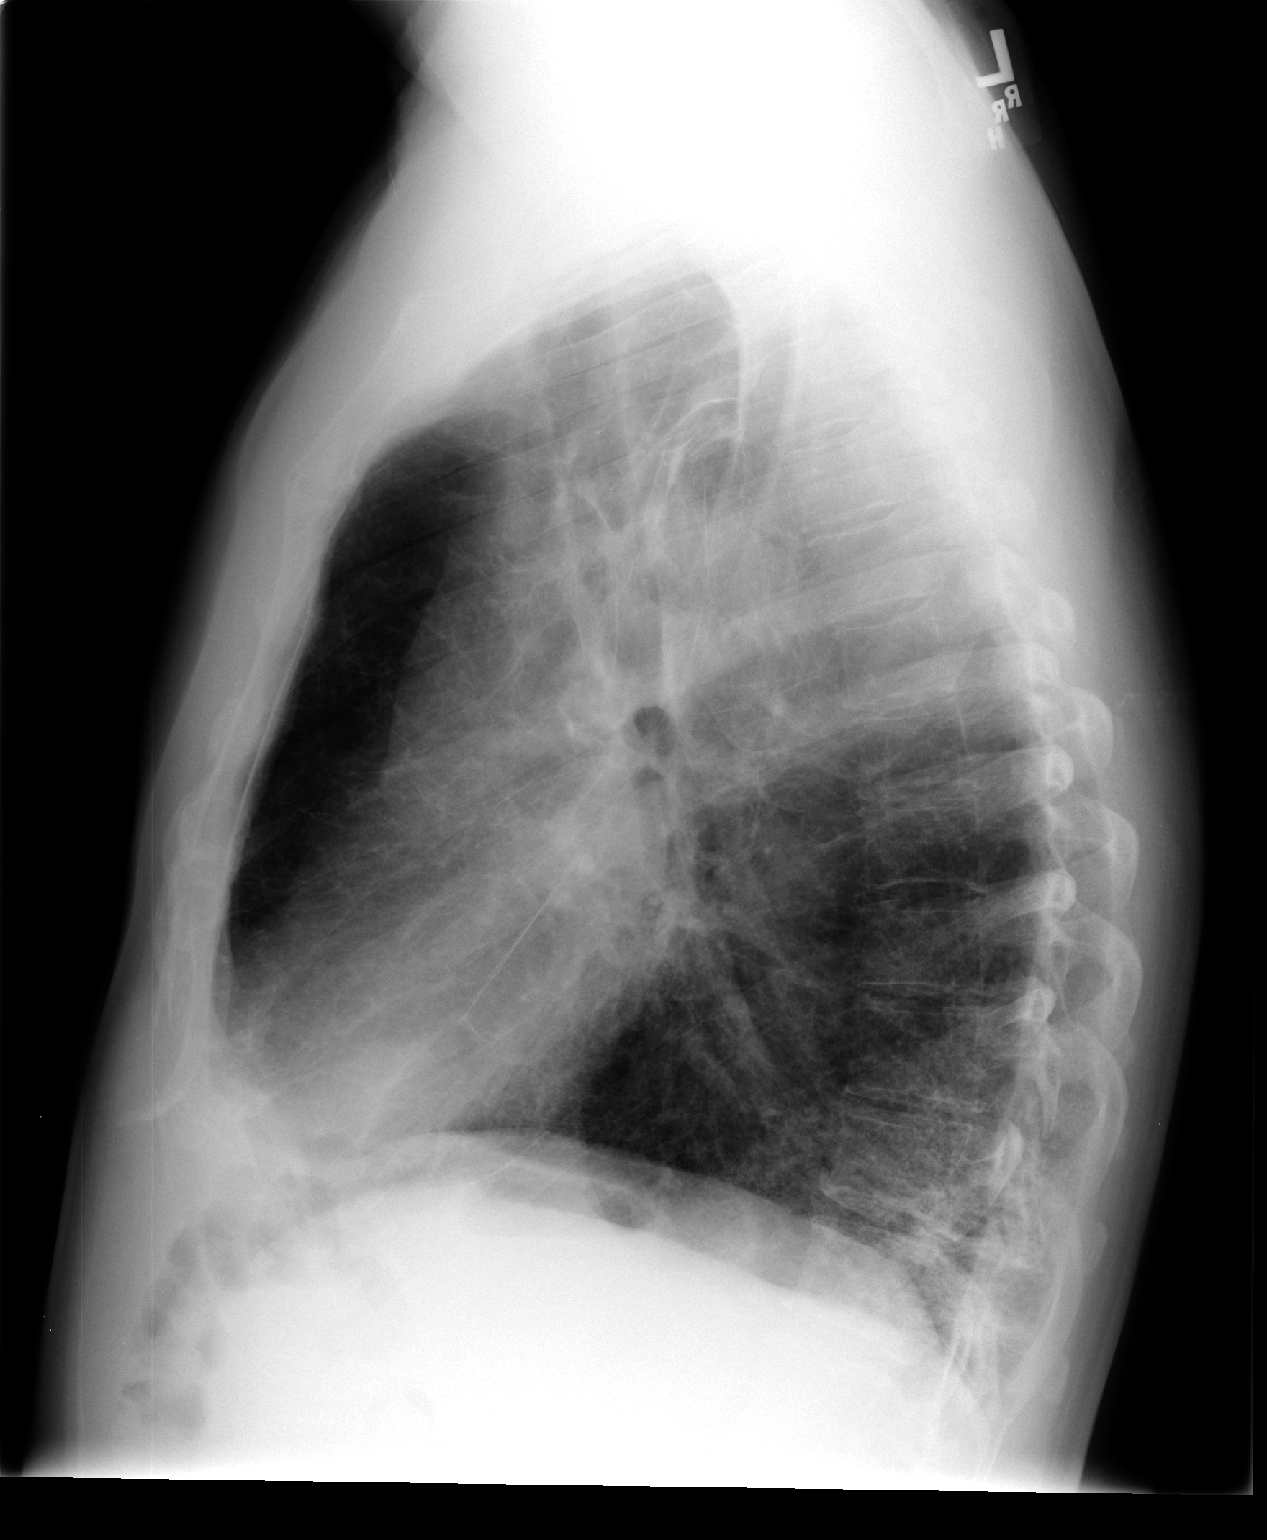

[2 of 2 positions shown; findings below may reference images not displayed]

FINDINGS: Cardiac shadow is stable. The lungs are again hyperinflated. By the
lateral lower lobe fibrotic changes are again seen. No acute
infiltrate is noted. A cavitary lesion is again noted in the lateral
aspect of the left upper lobe, stable from the prior exam. No acute
bony abnormality is seen.
IMPRESSION: Chronic changes without acute abnormality. Stable cavitary lesion in
the left upper lobe.

## 2015-04-05 IMAGING — CT CT HEAD W/O CM
1 series · 16 of 30 positions shown, 20 images · non-contrast
Comparison: Noncontrast CT scan of brain June 28, 2013

CLINICAL DATA: Right arm and leg tingling; no reported injury

EXAM:
CT HEAD WITHOUT CONTRAST
TECHNIQUE: Contiguous axial images were obtained from the base of the skull
through the vertex without intravenous contrast.

[Series 2: headseq 4.8 h37s · axial · 0.43mm/px · z∈[+1087,+1225]mm · 16 of 30 slices shown, 20 images]
[im 2/30  brain]
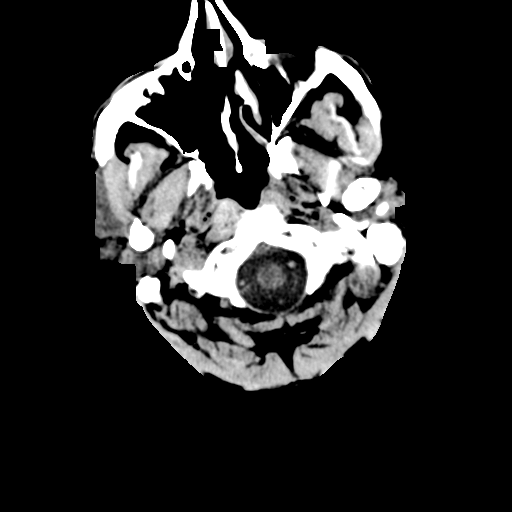
[im 2/30  bone]
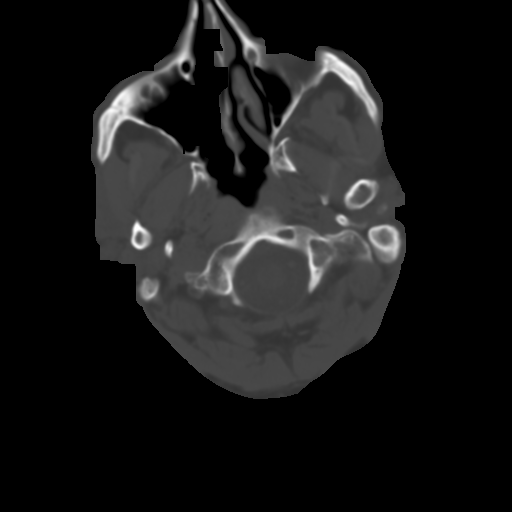
[im 4/30  brain]
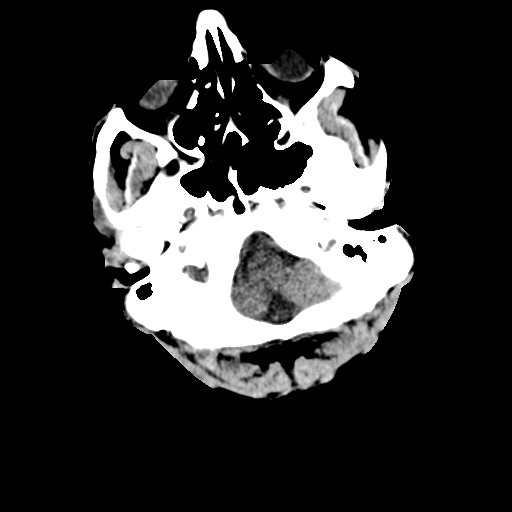
[im 6/30  brain]
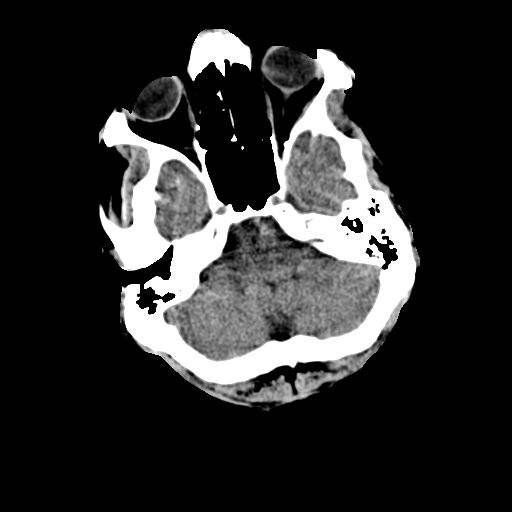
[im 8/30  brain]
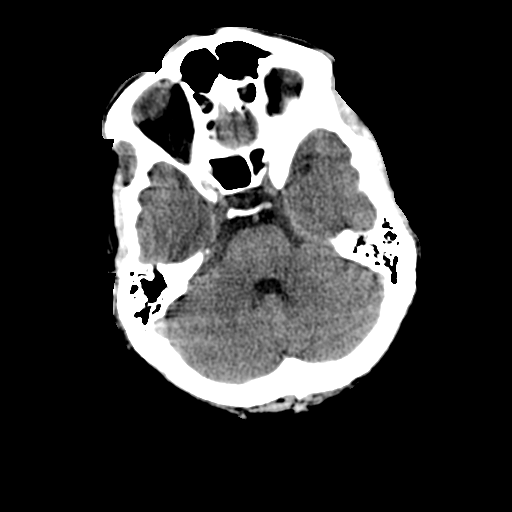
[im 9/30  brain]
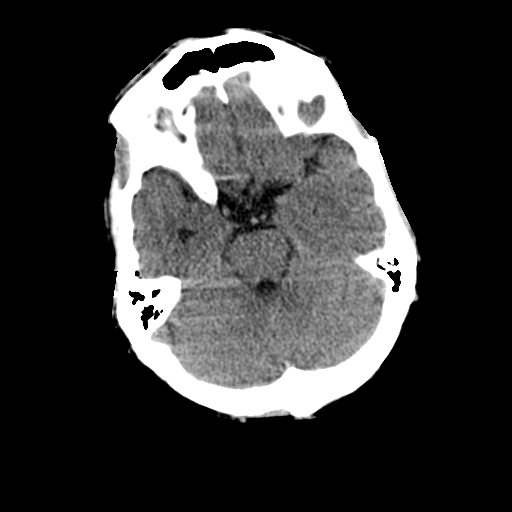
[im 9/30  bone]
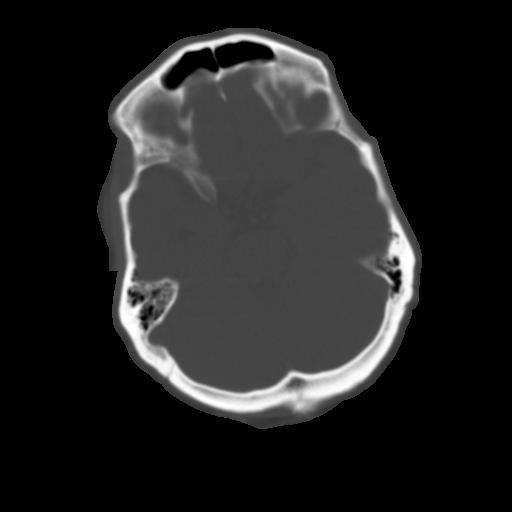
[im 11/30  brain]
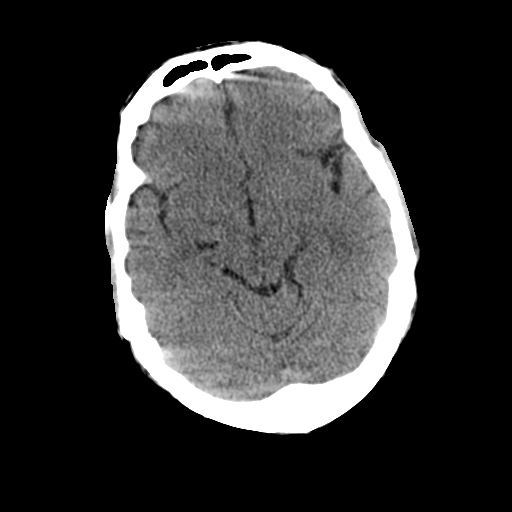
[im 13/30  brain]
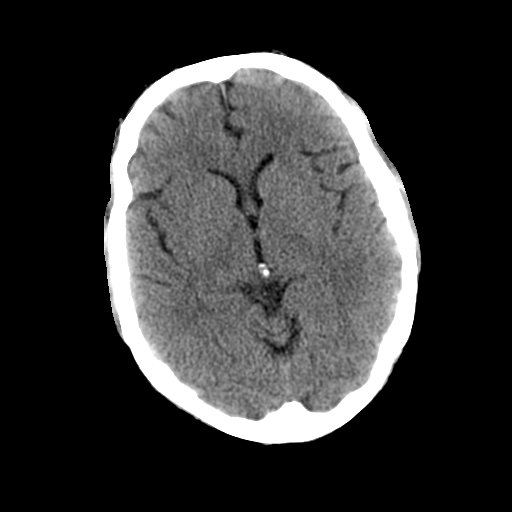
[im 15/30  brain]
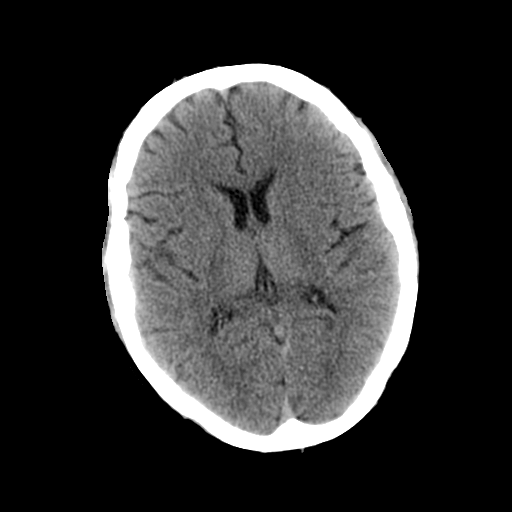
[im 16/30  brain]
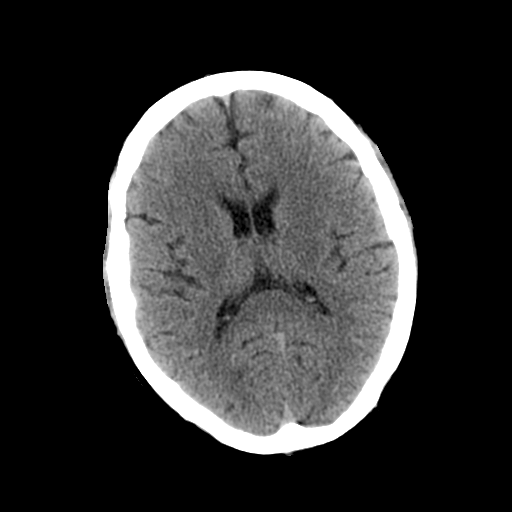
[im 16/30  bone]
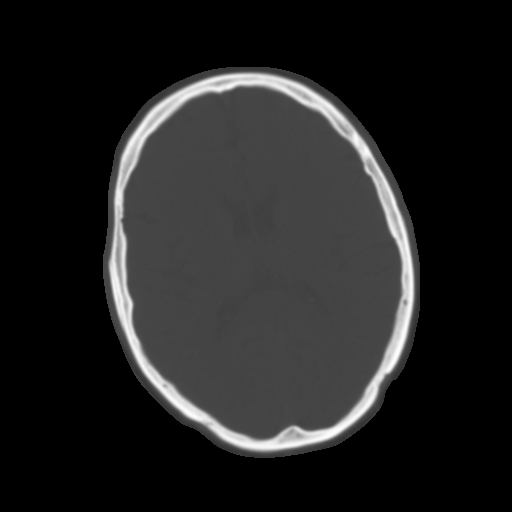
[im 18/30  brain]
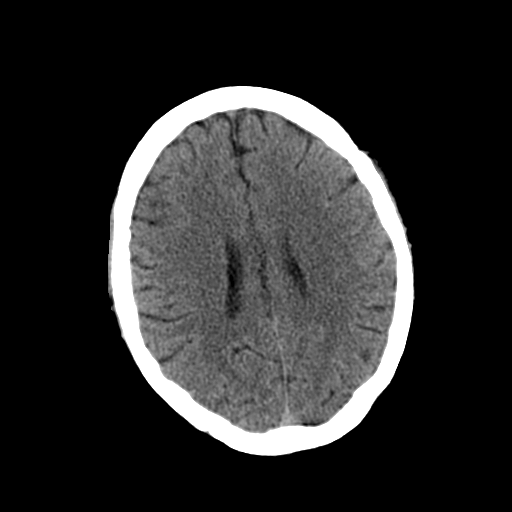
[im 20/30  brain]
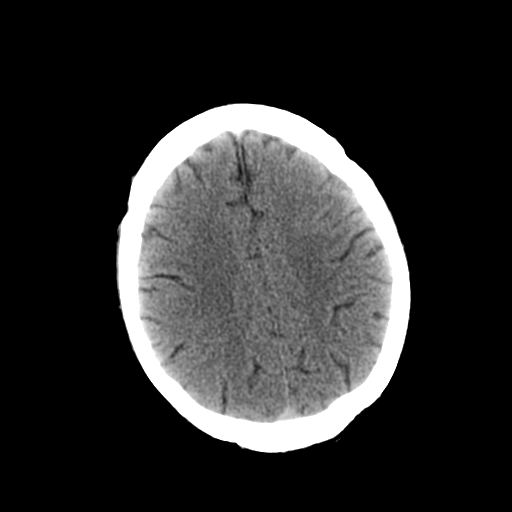
[im 22/30  brain]
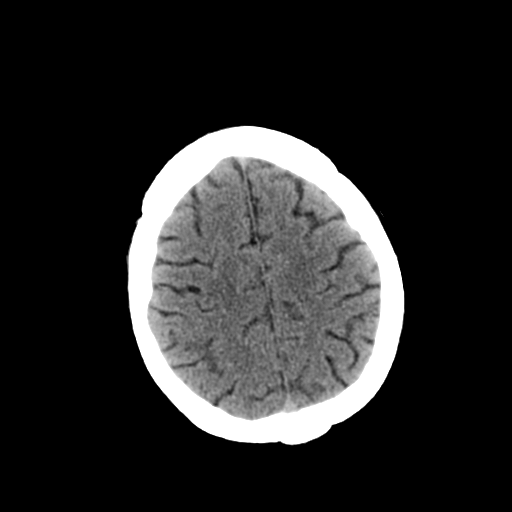
[im 23/30  brain]
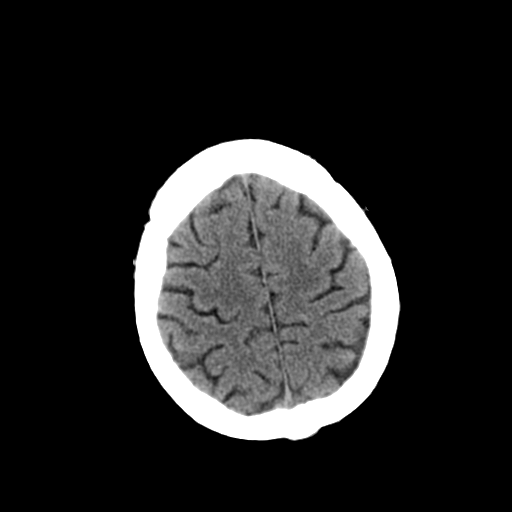
[im 23/30  bone]
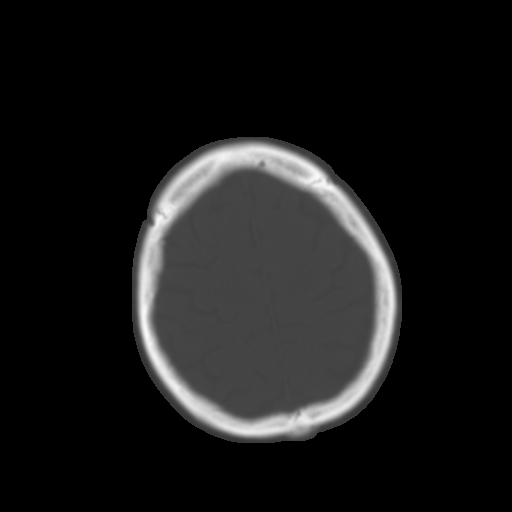
[im 25/30  brain]
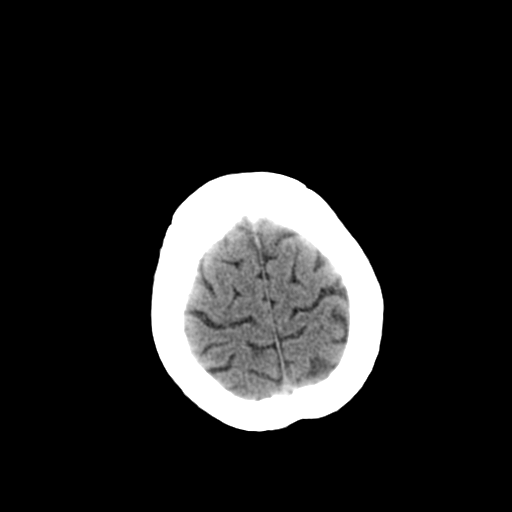
[im 27/30  brain]
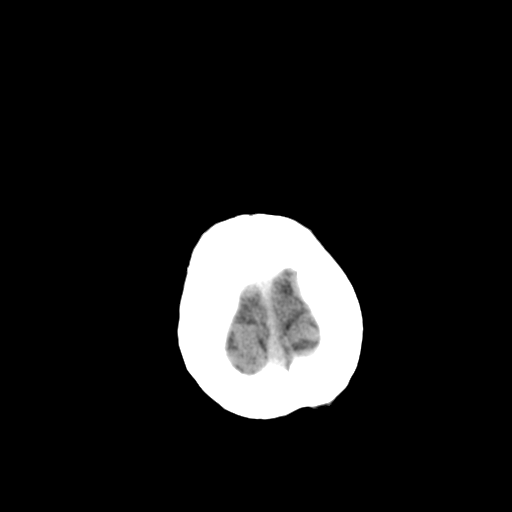
[im 29/30  brain]
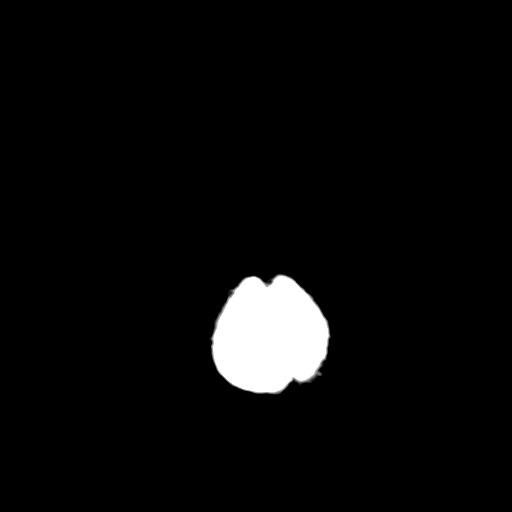

[16 of 30 positions shown; findings below may reference images not displayed]

FINDINGS: The ventricles are normal in size and position. There is no
intracranial hemorrhage nor intracranial mass effect. No acute
ischemic changes are demonstrated. The cerebellum and brainstem are
normal.

At bone window settings there is no acute skull fracture. The
observed portions of the paranasal sinuses are clear.
IMPRESSION: Normal noncontrast CT scan of the brain for age.

## 2015-05-01 ENCOUNTER — Encounter (HOSPITAL_COMMUNITY): Payer: Self-pay | Admitting: General Practice

## 2015-05-01 ENCOUNTER — Observation Stay (HOSPITAL_COMMUNITY)
Admission: EM | Admit: 2015-05-01 | Discharge: 2015-05-05 | Disposition: A | Discharge status: Home / Self Care | Payer: Medicare Other | Attending: Internal Medicine | Admitting: Internal Medicine

## 2015-05-01 ENCOUNTER — Emergency Department (HOSPITAL_COMMUNITY): Payer: Medicare Other

## 2015-05-01 ENCOUNTER — Observation Stay (HOSPITAL_COMMUNITY): Payer: Medicare Other

## 2015-05-01 DIAGNOSIS — K449 Diaphragmatic hernia without obstruction or gangrene: Secondary | ICD-10-CM | POA: Insufficient documentation

## 2015-05-01 DIAGNOSIS — E119 Type 2 diabetes mellitus without complications: Secondary | ICD-10-CM | POA: Diagnosis not present

## 2015-05-01 DIAGNOSIS — K219 Gastro-esophageal reflux disease without esophagitis: Secondary | ICD-10-CM | POA: Diagnosis not present

## 2015-05-01 DIAGNOSIS — R059 Cough, unspecified: Secondary | ICD-10-CM | POA: Diagnosis present

## 2015-05-01 DIAGNOSIS — Z9981 Dependence on supplemental oxygen: Secondary | ICD-10-CM | POA: Diagnosis not present

## 2015-05-01 DIAGNOSIS — K920 Hematemesis: Secondary | ICD-10-CM | POA: Insufficient documentation

## 2015-05-01 DIAGNOSIS — R911 Solitary pulmonary nodule: Secondary | ICD-10-CM | POA: Diagnosis not present

## 2015-05-01 DIAGNOSIS — Z7901 Long term (current) use of anticoagulants: Secondary | ICD-10-CM | POA: Insufficient documentation

## 2015-05-01 DIAGNOSIS — I4891 Unspecified atrial fibrillation: Secondary | ICD-10-CM | POA: Insufficient documentation

## 2015-05-01 DIAGNOSIS — R55 Syncope and collapse: Principal | ICD-10-CM | POA: Insufficient documentation

## 2015-05-01 DIAGNOSIS — I2510 Atherosclerotic heart disease of native coronary artery without angina pectoris: Secondary | ICD-10-CM | POA: Diagnosis not present

## 2015-05-01 DIAGNOSIS — F17200 Nicotine dependence, unspecified, uncomplicated: Secondary | ICD-10-CM | POA: Diagnosis present

## 2015-05-01 DIAGNOSIS — E785 Hyperlipidemia, unspecified: Secondary | ICD-10-CM | POA: Diagnosis not present

## 2015-05-01 DIAGNOSIS — Z7984 Long term (current) use of oral hypoglycemic drugs: Secondary | ICD-10-CM | POA: Diagnosis not present

## 2015-05-01 DIAGNOSIS — S92002A Unspecified fracture of left calcaneus, initial encounter for closed fracture: Secondary | ICD-10-CM | POA: Insufficient documentation

## 2015-05-01 DIAGNOSIS — F17210 Nicotine dependence, cigarettes, uncomplicated: Secondary | ICD-10-CM | POA: Insufficient documentation

## 2015-05-01 DIAGNOSIS — I482 Chronic atrial fibrillation, unspecified: Secondary | ICD-10-CM | POA: Insufficient documentation

## 2015-05-01 DIAGNOSIS — R52 Pain, unspecified: Secondary | ICD-10-CM | POA: Diagnosis present

## 2015-05-01 DIAGNOSIS — R262 Difficulty in walking, not elsewhere classified: Secondary | ICD-10-CM | POA: Diagnosis not present

## 2015-05-01 DIAGNOSIS — J189 Pneumonia, unspecified organism: Secondary | ICD-10-CM | POA: Insufficient documentation

## 2015-05-01 DIAGNOSIS — J449 Chronic obstructive pulmonary disease, unspecified: Secondary | ICD-10-CM | POA: Diagnosis not present

## 2015-05-01 DIAGNOSIS — J9610 Chronic respiratory failure, unspecified whether with hypoxia or hypercapnia: Secondary | ICD-10-CM

## 2015-05-01 DIAGNOSIS — K209 Esophagitis, unspecified: Secondary | ICD-10-CM | POA: Diagnosis not present

## 2015-05-01 DIAGNOSIS — IMO0002 Reserved for concepts with insufficient information to code with codable children: Secondary | ICD-10-CM

## 2015-05-01 DIAGNOSIS — R05 Cough: Secondary | ICD-10-CM | POA: Insufficient documentation

## 2015-05-01 DIAGNOSIS — M79671 Pain in right foot: Secondary | ICD-10-CM | POA: Insufficient documentation

## 2015-05-01 DIAGNOSIS — K922 Gastrointestinal hemorrhage, unspecified: Secondary | ICD-10-CM | POA: Diagnosis present

## 2015-05-01 DIAGNOSIS — M79672 Pain in left foot: Secondary | ICD-10-CM

## 2015-05-01 DIAGNOSIS — S0181XA Laceration without foreign body of other part of head, initial encounter: Secondary | ICD-10-CM | POA: Insufficient documentation

## 2015-05-01 DIAGNOSIS — R112 Nausea with vomiting, unspecified: Secondary | ICD-10-CM

## 2015-05-01 DIAGNOSIS — I480 Paroxysmal atrial fibrillation: Secondary | ICD-10-CM | POA: Insufficient documentation

## 2015-05-01 HISTORY — DX: Chronic respiratory failure, unspecified whether with hypoxia or hypercapnia: J96.10

## 2015-05-01 HISTORY — DX: Systemic involvement of connective tissue, unspecified: M35.9

## 2015-05-01 LAB — COMPREHENSIVE METABOLIC PANEL
ALK PHOS: 55 U/L (ref 38–126)
ALT: 14 U/L — AB (ref 17–63)
ANION GAP: 9 (ref 5–15)
AST: 20 U/L (ref 15–41)
Albumin: 3.1 g/dL — ABNORMAL LOW (ref 3.5–5.0)
BUN: 7 mg/dL (ref 6–20)
CHLORIDE: 102 mmol/L (ref 101–111)
CO2: 24 mmol/L (ref 22–32)
Calcium: 8.7 mg/dL — ABNORMAL LOW (ref 8.9–10.3)
Creatinine, Ser: 0.97 mg/dL (ref 0.61–1.24)
GFR calc Af Amer: >60 mL/min (ref >60)
GFR calc non Af Amer: >60 mL/min (ref >60)
GLUCOSE: 135 mg/dL — AB (ref 65–99)
POTASSIUM: 4 mmol/L (ref 3.5–5.1)
SODIUM: 135 mmol/L (ref 135–145)
Total Bilirubin: 0.3 mg/dL (ref 0.3–1.2)
Total Protein: 6.3 g/dL — ABNORMAL LOW (ref 6.5–8.1)

## 2015-05-01 LAB — CBC
HEMATOCRIT: 41.1 % (ref 39.0–52.0)
HEMOGLOBIN: 13.5 g/dL (ref 13.0–17.0)
MCH: 27.4 pg (ref 26.0–34.0)
MCHC: 32.8 g/dL (ref 30.0–36.0)
MCV: 83.5 fL (ref 78.0–100.0)
Platelets: 197 10*3/uL (ref 150–400)
RBC: 4.92 MIL/uL (ref 4.22–5.81)
RDW: 14.8 % (ref 11.5–15.5)
WBC: 9.5 10*3/uL (ref 4.0–10.5)

## 2015-05-01 LAB — I-STAT CHEM 8, ED
BUN: 8 mg/dL (ref 6–20)
Calcium, Ion: 1.08 mmol/L — ABNORMAL LOW (ref 1.12–1.23)
Chloride: 100 mmol/L — ABNORMAL LOW (ref 101–111)
Creatinine, Ser: 0.9 mg/dL (ref 0.61–1.24)
Glucose, Bld: 142 mg/dL — ABNORMAL HIGH (ref 65–99)
HEMATOCRIT: 44 % (ref 39.0–52.0)
HEMOGLOBIN: 15 g/dL (ref 13.0–17.0)
POTASSIUM: 4 mmol/L (ref 3.5–5.1)
SODIUM: 136 mmol/L (ref 135–145)
TCO2: 22 mmol/L (ref 0–100)

## 2015-05-01 LAB — GLUCOSE, CAPILLARY
Glucose-Capillary: 132 mg/dL — ABNORMAL HIGH (ref 65–99)
Glucose-Capillary: 168 mg/dL — ABNORMAL HIGH (ref 65–99)

## 2015-05-01 LAB — PROTIME-INR
INR: 1.09 (ref 0.00–1.49)
Prothrombin Time: 14.3 seconds (ref 11.6–15.2)

## 2015-05-01 LAB — TSH: TSH: 5.257 u[IU]/mL — ABNORMAL HIGH (ref 0.350–4.500)

## 2015-05-01 LAB — ETHANOL: Alcohol, Ethyl (B): <5 mg/dL (ref <5)

## 2015-05-01 MED ORDER — NITROGLYCERIN 0.4 MG SL SUBL: 0.4000 mg | SUBLINGUAL_TABLET | SUBLINGUAL | Status: DC | PRN

## 2015-05-01 MED ORDER — DILTIAZEM HCL ER COATED BEADS 120 MG PO CP24
120.0000 mg | ORAL_CAPSULE | Freq: Every day | ORAL | Status: DC
  Administered 2015-05-02 – 2015-05-05 (×4): 120 mg via ORAL
  Filled 2015-05-01 (×4): qty 1

## 2015-05-01 MED ORDER — NICOTINE 21 MG/24HR TD PT24
21.0000 mg | MEDICATED_PATCH | Freq: Every day | TRANSDERMAL | Status: DC
  Administered 2015-05-01 – 2015-05-05 (×5): 21 mg via TRANSDERMAL
  Filled 2015-05-01 (×5): qty 1

## 2015-05-01 MED ORDER — IOHEXOL 300 MG/ML  SOLN
100.0000 mL | Freq: Once | INTRAMUSCULAR | Status: AC | PRN
  Administered 2015-05-01: 100 mL via INTRAVENOUS

## 2015-05-01 MED ORDER — LEVOFLOXACIN IN D5W 750 MG/150ML IV SOLN
750.0000 mg | INTRAVENOUS | Status: DC
  Administered 2015-05-02 – 2015-05-03 (×2): 750 mg via INTRAVENOUS
  Filled 2015-05-01 (×3): qty 150

## 2015-05-01 MED ORDER — LEVOFLOXACIN IN D5W 750 MG/150ML IV SOLN
750.0000 mg | Freq: Once | INTRAVENOUS | Status: AC
  Administered 2015-05-01: 750 mg via INTRAVENOUS
  Filled 2015-05-01: qty 150

## 2015-05-01 MED ORDER — INSULIN ASPART 100 UNIT/ML ~~LOC~~ SOLN
0.0000 [IU] | Freq: Every day | SUBCUTANEOUS | Status: DC
  Administered 2015-05-02: 2 [IU] via SUBCUTANEOUS

## 2015-05-01 MED ORDER — BACLOFEN 10 MG PO TABS
10.0000 mg | ORAL_TABLET | Freq: Three times a day (TID) | ORAL | Status: DC
  Administered 2015-05-01 – 2015-05-05 (×10): 10 mg via ORAL
  Filled 2015-05-01 (×11): qty 1

## 2015-05-01 MED ORDER — PREDNISONE 20 MG PO TABS
20.0000 mg | ORAL_TABLET | Freq: Two times a day (BID) | ORAL | Status: DC
  Administered 2015-05-01 – 2015-05-03 (×4): 20 mg via ORAL
  Filled 2015-05-01 (×4): qty 1

## 2015-05-01 MED ORDER — DILTIAZEM HCL 25 MG/5ML IV SOLN
5.0000 mg | Freq: Once | INTRAVENOUS | Status: AC
  Administered 2015-05-01: 5 mg via INTRAVENOUS
  Filled 2015-05-01: qty 5

## 2015-05-01 MED ORDER — ONDANSETRON HCL 4 MG PO TABS: 4.0000 mg | ORAL_TABLET | Freq: Four times a day (QID) | ORAL | Status: DC | PRN

## 2015-05-01 MED ORDER — BISACODYL 10 MG RE SUPP: 10.0000 mg | Freq: Every day | RECTAL | Status: DC | PRN

## 2015-05-01 MED ORDER — DEXTROMETHORPHAN-GUAIFENESIN 20-400 MG PO TABS: 1.0000 | ORAL_TABLET | Freq: Four times a day (QID) | ORAL | Status: DC | PRN

## 2015-05-01 MED ORDER — DOCUSATE SODIUM 100 MG PO CAPS
100.0000 mg | ORAL_CAPSULE | Freq: Two times a day (BID) | ORAL | Status: DC
  Administered 2015-05-01 – 2015-05-05 (×8): 100 mg via ORAL
  Filled 2015-05-01 (×8): qty 1

## 2015-05-01 MED ORDER — LORAZEPAM 0.5 MG PO TABS
0.5000 mg | ORAL_TABLET | Freq: Two times a day (BID) | ORAL | Status: DC
  Administered 2015-05-01 – 2015-05-05 (×7): 0.5 mg via ORAL
  Filled 2015-05-01 (×8): qty 1

## 2015-05-01 MED ORDER — ALUM & MAG HYDROXIDE-SIMETH 200-200-20 MG/5ML PO SUSP
30.0000 mL | Freq: Four times a day (QID) | ORAL | Status: DC | PRN
  Administered 2015-05-01 – 2015-05-03 (×3): 30 mL via ORAL
  Filled 2015-05-01 (×5): qty 30

## 2015-05-01 MED ORDER — DIPHENHYDRAMINE HCL (SLEEP) 25 MG PO TABS: 25.0000 mg | ORAL_TABLET | Freq: Every evening | ORAL | Status: DC | PRN

## 2015-05-01 MED ORDER — EDOXABAN TOSYLATE 60 MG PO TABS
60.0000 mg | ORAL_TABLET | Freq: Every day | ORAL | Status: DC
  Administered 2015-05-02: 60 mg via ORAL
  Filled 2015-05-01: qty 60

## 2015-05-01 MED ORDER — DIPHENHYDRAMINE HCL 25 MG PO CAPS
25.0000 mg | ORAL_CAPSULE | Freq: Every evening | ORAL | Status: DC | PRN
  Administered 2015-05-05: 25 mg via ORAL
  Filled 2015-05-01: qty 1

## 2015-05-01 MED ORDER — BENZONATATE 100 MG PO CAPS
100.0000 mg | ORAL_CAPSULE | Freq: Three times a day (TID) | ORAL | Status: DC | PRN
  Administered 2015-05-02 – 2015-05-05 (×4): 100 mg via ORAL
  Filled 2015-05-01 (×4): qty 1

## 2015-05-01 MED ORDER — SODIUM CHLORIDE 0.9 % IV BOLUS (SEPSIS)
125.0000 mL | Freq: Once | INTRAVENOUS | Status: AC
  Administered 2015-05-01: 125 mL via INTRAVENOUS

## 2015-05-01 MED ORDER — SODIUM CHLORIDE 0.9 % IV SOLN
INTRAVENOUS | Status: AC
  Administered 2015-05-01 – 2015-05-02 (×2): via INTRAVENOUS

## 2015-05-01 MED ORDER — ACETAMINOPHEN 650 MG RE SUPP: 650.0000 mg | Freq: Four times a day (QID) | RECTAL | Status: DC | PRN

## 2015-05-01 MED ORDER — ACETAMINOPHEN 325 MG PO TABS: 650.0000 mg | ORAL_TABLET | Freq: Four times a day (QID) | ORAL | Status: DC | PRN

## 2015-05-01 MED ORDER — LEVALBUTEROL HCL 0.63 MG/3ML IN NEBU
0.6300 mg | INHALATION_SOLUTION | Freq: Three times a day (TID) | RESPIRATORY_TRACT | Status: DC
  Administered 2015-05-01 – 2015-05-04 (×6): 0.63 mg via RESPIRATORY_TRACT
  Filled 2015-05-01 (×7): qty 3

## 2015-05-01 MED ORDER — MOMETASONE FURO-FORMOTEROL FUM 100-5 MCG/ACT IN AERO
2.0000 | INHALATION_SPRAY | Freq: Two times a day (BID) | RESPIRATORY_TRACT | Status: DC
  Administered 2015-05-01 – 2015-05-05 (×7): 2 via RESPIRATORY_TRACT
  Filled 2015-05-01: qty 8.8

## 2015-05-01 MED ORDER — MORPHINE SULFATE (PF) 4 MG/ML IV SOLN
4.0000 mg | Freq: Once | INTRAVENOUS | Status: AC
  Administered 2015-05-01: 4 mg via INTRAVENOUS
  Filled 2015-05-01: qty 1

## 2015-05-01 MED ORDER — HYDROMORPHONE HCL 1 MG/ML IJ SOLN
0.5000 mg | INTRAMUSCULAR | Status: DC | PRN
  Administered 2015-05-01 – 2015-05-05 (×12): 0.5 mg via INTRAVENOUS
  Filled 2015-05-01 (×13): qty 1

## 2015-05-01 MED ORDER — FLUTICASONE PROPIONATE 50 MCG/ACT NA SUSP
2.0000 | Freq: Every day | NASAL | Status: DC
  Filled 2015-05-01: qty 16

## 2015-05-01 MED ORDER — AZITHROMYCIN 250 MG PO TABS: ORAL_TABLET | ORAL | Status: DC

## 2015-05-01 MED ORDER — HYDROCODONE-ACETAMINOPHEN 5-325 MG PO TABS
1.0000 | ORAL_TABLET | ORAL | Status: DC | PRN
  Administered 2015-05-01 – 2015-05-05 (×16): 2 via ORAL
  Filled 2015-05-01 (×17): qty 2

## 2015-05-01 MED ORDER — MONTELUKAST SODIUM 10 MG PO TABS
10.0000 mg | ORAL_TABLET | Freq: Every day | ORAL | Status: DC
  Administered 2015-05-01 – 2015-05-04 (×4): 10 mg via ORAL
  Filled 2015-05-01 (×4): qty 1

## 2015-05-01 MED ORDER — ONDANSETRON HCL 4 MG/2ML IJ SOLN
4.0000 mg | Freq: Four times a day (QID) | INTRAMUSCULAR | Status: DC | PRN
  Administered 2015-05-02: 4 mg via INTRAVENOUS
  Filled 2015-05-01: qty 2

## 2015-05-01 MED ORDER — SODIUM CHLORIDE 0.9 % IJ SOLN
3.0000 mL | Freq: Two times a day (BID) | INTRAMUSCULAR | Status: DC
  Administered 2015-05-01 – 2015-05-05 (×8): 3 mL via INTRAVENOUS

## 2015-05-01 MED ORDER — TIOTROPIUM BROMIDE MONOHYDRATE 18 MCG IN CAPS
18.0000 ug | ORAL_CAPSULE | Freq: Every day | RESPIRATORY_TRACT | Status: DC
  Administered 2015-05-03 – 2015-05-05 (×3): 18 ug via RESPIRATORY_TRACT
  Filled 2015-05-01: qty 5

## 2015-05-01 MED ORDER — INSULIN ASPART 100 UNIT/ML ~~LOC~~ SOLN
0.0000 [IU] | Freq: Three times a day (TID) | SUBCUTANEOUS | Status: DC
  Administered 2015-05-02 (×2): 3 [IU] via SUBCUTANEOUS
  Administered 2015-05-02: 5 [IU] via SUBCUTANEOUS

## 2015-05-01 MED ORDER — DILTIAZEM HCL ER COATED BEADS 120 MG PO CP24
120.0000 mg | ORAL_CAPSULE | Freq: Every day | ORAL | Status: DC
  Administered 2015-05-01: 120 mg via ORAL
  Filled 2015-05-01: qty 1

## 2015-05-01 MED ORDER — GUAIFENESIN-DM 100-10 MG/5ML PO SYRP
10.0000 mL | ORAL_SOLUTION | Freq: Four times a day (QID) | ORAL | Status: DC | PRN
  Administered 2015-05-03: 10 mL via ORAL
  Filled 2015-05-01: qty 10

## 2015-05-01 NOTE — ED Notes (Signed)
Patient being transported out of the department for testing
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
## 2015-05-01 NOTE — ED Notes (Signed)
Patient arrived via [place] on [address] with c-collar in tact; patient placed in gown, on monitor, continuous pulse oximetry and blood pressure cuff

## 2015-05-01 NOTE — H&P (Signed)
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  Assessment/Plan Principal Problem:   Syncope Active Problems:   Hyperlipidemia   TOBACCO ABUSE   COPD (chronic obstructive pulmonary disease) (HCC)   Type 2 diabetes mellitus without complication (HCC)   Cough   CAP (community acquired pneumonia)   A-fib (HCC)   Pain, generalized   Laceration   Syncope, tussive  #1 syncope: tussive. Patient reports history of same. Likely vagal response during coughing. Will monitor on telemetry. CT head without acute abnormalities. Will check orthostatic vital signs on admission and in am. Mobilize with PT. Provide anti-tussive. Will need close OP follow up with Dr. Luan Pulling pulmonology in [place]  #2 CAP: per CT. currently he is afebrile and nontoxic appearing. Will continue Levaquin. Check strep pneumo urine antigen as well as Legionella urine antigen. Continue oxygen supplementation and supportive treatment in the form of inhalers and nebs. Of note patient has chronic respiratory failure on oxygen 3 L at home. Of note CT reveals new 0.8 cm left upper lobe pulmonary nodule since March. Note patient in the hospital in March of this year for community-acquired pneumonia as well. Will need close outpatient follow-up to ensure  resolution.   #3. A. Fib. Rate controlled. Continue diltiazem. He reports taking savaysa. Will continue. Monitor on telemtry.   #4. COPD. He wears oxygen 24 hours a day at 3 L. He reports he has been at his baseline in terms of respiratory effort with perhaps a slight increase in his cough. Continue his home medications including prednisone. Sees Dr. Luan Pulling in [place]  #5. chronic respiratory failure related to COPD.  See #4. Hx lung disease as well as cavitary lesion of lung and pulmonary nodule in setting COPD.   #6. Generalized pain. Patient has chronic pain syndrome particularly in his shoulders and his back. Today's events has worsened that. Will manage his pain and mobilize with assistance of physical therapy.  #7. Diabetes. Hold his metformin for now related to recent CT scans. Obtain a hemoglobin A1c. We sliding scale insulin for optimal control.  #8. Tobacco abuse. He states "I'm smoking a little". Cessation counseling offered. Nicotine patch    Code Status: full DVT Prophylaxis: Family Communication: none present Disposition Plan: home hopefully 24 hours  Time spent: 60 minutes  [facility]
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
## 2015-05-02 ENCOUNTER — Observation Stay (HOSPITAL_COMMUNITY): Payer: Medicare Other

## 2015-05-02 DIAGNOSIS — I482 Chronic atrial fibrillation, unspecified: Secondary | ICD-10-CM | POA: Insufficient documentation

## 2015-05-02 DIAGNOSIS — R911 Solitary pulmonary nodule: Secondary | ICD-10-CM | POA: Insufficient documentation

## 2015-05-02 DIAGNOSIS — F17200 Nicotine dependence, unspecified, uncomplicated: Secondary | ICD-10-CM

## 2015-05-02 DIAGNOSIS — M79672 Pain in left foot: Secondary | ICD-10-CM

## 2015-05-02 DIAGNOSIS — I4891 Unspecified atrial fibrillation: Secondary | ICD-10-CM

## 2015-05-02 DIAGNOSIS — E119 Type 2 diabetes mellitus without complications: Secondary | ICD-10-CM

## 2015-05-02 DIAGNOSIS — R55 Syncope and collapse: Secondary | ICD-10-CM | POA: Diagnosis not present

## 2015-05-02 DIAGNOSIS — S0181XA Laceration without foreign body of other part of head, initial encounter: Secondary | ICD-10-CM | POA: Insufficient documentation

## 2015-05-02 DIAGNOSIS — M79671 Pain in right foot: Secondary | ICD-10-CM

## 2015-05-02 DIAGNOSIS — J189 Pneumonia, unspecified organism: Secondary | ICD-10-CM

## 2015-05-02 DIAGNOSIS — J9610 Chronic respiratory failure, unspecified whether with hypoxia or hypercapnia: Secondary | ICD-10-CM

## 2015-05-02 DIAGNOSIS — R52 Pain, unspecified: Secondary | ICD-10-CM

## 2015-05-02 DIAGNOSIS — E785 Hyperlipidemia, unspecified: Secondary | ICD-10-CM

## 2015-05-02 DIAGNOSIS — R05 Cough: Secondary | ICD-10-CM

## 2015-05-02 DIAGNOSIS — J42 Unspecified chronic bronchitis: Secondary | ICD-10-CM | POA: Diagnosis not present

## 2015-05-02 DIAGNOSIS — T148 Other injury of unspecified body region: Secondary | ICD-10-CM

## 2015-05-02 LAB — CBC
HCT: 33.1 % — ABNORMAL LOW (ref 39.0–52.0)
HEMATOCRIT: 40.4 % (ref 39.0–52.0)
HEMOGLOBIN: 13.1 g/dL (ref 13.0–17.0)
HEMOGLOBIN: 10.7 g/dL — AB (ref 13.0–17.0)
MCH: 27.6 pg (ref 26.0–34.0)
MCH: 27.2 pg (ref 26.0–34.0)
MCHC: 32.4 g/dL (ref 30.0–36.0)
MCHC: 32.3 g/dL (ref 30.0–36.0)
MCV: 85.2 fL (ref 78.0–100.0)
MCV: 84.2 fL (ref 78.0–100.0)
Platelets: 233 10*3/uL (ref 150–400)
Platelets: 208 10*3/uL (ref 150–400)
RBC: 4.74 MIL/uL (ref 4.22–5.81)
RBC: 3.93 MIL/uL — ABNORMAL LOW (ref 4.22–5.81)
RDW: 14.9 % (ref 11.5–15.5)
RDW: 14.6 % (ref 11.5–15.5)
WBC: 8.2 10*3/uL (ref 4.0–10.5)
WBC: 9.2 10*3/uL (ref 4.0–10.5)

## 2015-05-02 LAB — GLUCOSE, CAPILLARY
GLUCOSE-CAPILLARY: 167 mg/dL — AB (ref 65–99)
GLUCOSE-CAPILLARY: 231 mg/dL — AB (ref 65–99)
GLUCOSE-CAPILLARY: 222 mg/dL — AB (ref 65–99)
Glucose-Capillary: 186 mg/dL — ABNORMAL HIGH (ref 65–99)

## 2015-05-02 LAB — BASIC METABOLIC PANEL
ANION GAP: 10 (ref 5–15)
Anion gap: 8 (ref 5–15)
BUN: 11 mg/dL (ref 6–20)
BUN: 33 mg/dL — AB (ref 6–20)
CHLORIDE: 97 mmol/L — AB (ref 101–111)
CHLORIDE: 95 mmol/L — AB (ref 101–111)
CO2: 28 mmol/L (ref 22–32)
CO2: 25 mmol/L (ref 22–32)
CREATININE: 0.92 mg/dL (ref 0.61–1.24)
Calcium: 8.8 mg/dL — ABNORMAL LOW (ref 8.9–10.3)
Calcium: 7.9 mg/dL — ABNORMAL LOW (ref 8.9–10.3)
Creatinine, Ser: 1.03 mg/dL (ref 0.61–1.24)
GFR calc Af Amer: >60 mL/min (ref >60)
GFR calc Af Amer: >60 mL/min (ref >60)
GFR calc non Af Amer: >60 mL/min (ref >60)
GFR calc non Af Amer: >60 mL/min (ref >60)
GLUCOSE: 217 mg/dL — AB (ref 65–99)
Glucose, Bld: 180 mg/dL — ABNORMAL HIGH (ref 65–99)
POTASSIUM: 4.3 mmol/L (ref 3.5–5.1)
Potassium: 5.1 mmol/L (ref 3.5–5.1)
SODIUM: 128 mmol/L — AB (ref 135–145)
Sodium: 135 mmol/L (ref 135–145)

## 2015-05-02 LAB — HEMOGLOBIN A1C
Hgb A1c MFr Bld: 7.2 % — ABNORMAL HIGH (ref 4.8–5.6)
Mean Plasma Glucose: 160 mg/dL

## 2015-05-02 LAB — STREP PNEUMONIAE URINARY ANTIGEN: Strep Pneumo Urinary Antigen: NEGATIVE

## 2015-05-02 LAB — OCCULT BLOOD GASTRIC / DUODENUM (SPECIMEN CUP): Occult Blood, Gastric: POSITIVE — AB

## 2015-05-02 MED ORDER — PANTOPRAZOLE SODIUM 40 MG PO TBEC
40.0000 mg | DELAYED_RELEASE_TABLET | Freq: Every day | ORAL | Status: DC
  Administered 2015-05-02: 40 mg via ORAL
  Filled 2015-05-02: qty 1

## 2015-05-02 MED ORDER — DEXTROSE 5 % IV SOLN
5.0000 mg/h | INTRAVENOUS | Status: DC
  Filled 2015-05-02: qty 100

## 2015-05-02 MED ORDER — PROMETHAZINE HCL 25 MG/ML IJ SOLN
12.5000 mg | Freq: Once | INTRAMUSCULAR | Status: AC
  Administered 2015-05-02: 12.5 mg via INTRAVENOUS
  Filled 2015-05-02: qty 1

## 2015-05-02 MED ORDER — SODIUM CHLORIDE 0.9 % IV SOLN
INTRAVENOUS | Status: DC
  Administered 2015-05-03: 21:00:00 via INTRAVENOUS

## 2015-05-02 MED ORDER — INSULIN ASPART 100 UNIT/ML ~~LOC~~ SOLN
0.0000 [IU] | SUBCUTANEOUS | Status: DC
  Administered 2015-05-03: 3 [IU] via SUBCUTANEOUS
  Administered 2015-05-03: 5 [IU] via SUBCUTANEOUS
  Administered 2015-05-03: 3 [IU] via SUBCUTANEOUS

## 2015-05-02 MED ORDER — PANTOPRAZOLE SODIUM 40 MG IV SOLR
40.0000 mg | Freq: Two times a day (BID) | INTRAVENOUS | Status: DC
  Administered 2015-05-03 – 2015-05-04 (×4): 40 mg via INTRAVENOUS
  Filled 2015-05-02 (×4): qty 40

## 2015-05-02 MED ORDER — FLUTICASONE PROPIONATE 50 MCG/ACT NA SUSP
2.0000 | Freq: Every day | NASAL | Status: DC
  Filled 2015-05-02: qty 16

## 2015-05-02 MED ORDER — GI COCKTAIL ~~LOC~~: 30.0000 mL | Freq: Two times a day (BID) | ORAL | Status: DC | PRN
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
## 2015-05-02 NOTE — Discharge Instructions (Signed)
Keep wound clean. Follow-up for repeat CT scan of your chest in approximate 6 months. Take antibiotics as discussed.  If you were given medicines take as directed.  If you are on coumadin or contraceptives realize their levels and effectiveness is altered by many different medicines.  If you have any reaction (rash, tongues swelling, other) to the medicines stop taking and see a physician.    If your blood pressure was elevated in the ER make sure you follow up for management with a primary doctor or return for chest pain, shortness of breath or stroke symptoms.  Please follow up as directed and return to the ER or see a physician for new or worsening symptoms.  Thank you. Filed Vitals:   05/01/15 1038 05/01/15 1039 05/01/15 1330 05/01/15 1345  BP: 120/82  119/78 117/79  Pulse: 80  73 71  Temp: 97.7 F (36.5 C)     TempSrc: Oral     Resp: '22  18 20  '$ SpO2: 96% 97% 93% 94%    Atrial Fibrillation Atrial fibrillation is a type of irregular or rapid heartbeat (arrhythmia). In atrial fibrillation, the heart quivers continuously in a chaotic pattern. This occurs when parts of the heart receive disorganized signals that make the heart unable to pump blood normally. This can increase the risk for stroke, heart failure, and other heart-related conditions. There are different types of atrial fibrillation, including:  Paroxysmal atrial fibrillation. This type starts suddenly, and it usually stops on its own shortly after it starts.  Persistent atrial fibrillation. This type often lasts longer than a week. It may stop on its own or with treatment.  Long-lasting persistent atrial fibrillation. This type lasts longer than 12 months.  Permanent atrial fibrillation. This type does not go away. Talk with your health care provider to learn about the type of atrial fibrillation that you have. CAUSES This condition is caused by some heart-related conditions or procedures, including:  A heart  attack.  Coronary artery disease.  Heart failure.  Heart valve conditions.  High blood pressure.  Inflammation of the sac that surrounds the heart (pericarditis).  Heart surgery.  Certain heart rhythm disorders, such as Wolf-Parkinson-White syndrome. Other causes include:  Pneumonia.  Obstructive sleep apnea.  Blockage of an artery in the lungs (pulmonary embolism, or PE).  Lung cancer.  Chronic lung disease.  Thyroid problems, especially if the thyroid is overactive (hyperthyroidism).  Caffeine.  Excessive alcohol use or illegal drug use.  Use of some medicines, including certain decongestants and diet pills. Sometimes, the cause cannot be found. RISK FACTORS This condition is more likely to develop in:  People who are older in age.  People who smoke.  People who have diabetes mellitus.  People who are overweight (obese).  Athletes who exercise vigorously. SYMPTOMS Symptoms of this condition include:  A feeling that your heart is beating rapidly or irregularly.  A feeling of discomfort or pain in your chest.  Shortness of breath.  Sudden light-headedness or weakness.  Getting tired easily during exercise. In some cases, there are no symptoms. DIAGNOSIS Your health care provider may be able to detect atrial fibrillation when taking your pulse. If detected, this condition may be diagnosed with:  An electrocardiogram (ECG).  A Holter monitor test that records your heartbeat patterns over a 24-hour period.  Transthoracic echocardiogram (TTE) to evaluate how blood flows through your heart.  Transesophageal echocardiogram (TEE) to view more detailed images of your heart.  A stress test.  Imaging tests, such  as a CT scan or chest X-ray.  Blood tests. TREATMENT The main goals of treatment are to prevent blood clots from forming and to keep your heart beating at a normal rate and rhythm. The type of treatment that you receive depends on many  factors, such as your underlying medical conditions and how you feel when you are experiencing atrial fibrillation. This condition may be treated with:  Medicine to slow down the heart rate, bring the heart's rhythm back to normal, or prevent clots from forming.  Electrical cardioversion. This is a procedure that resets your heart's rhythm by delivering a controlled, low-energy shock to the heart through your skin.  Different types of ablation, such as catheter ablation, catheter ablation with pacemaker, or surgical ablation. These procedures destroy the heart tissues that send abnormal signals. When the pacemaker is used, it is placed under your skin to help your heart beat in a regular rhythm. HOME CARE INSTRUCTIONS  Take over-the counter and prescription medicines only as told by your health care provider.  If your health care provider prescribed a blood-thinning medicine (anticoagulant), take it exactly as told. Taking too much blood-thinning medicine can cause bleeding. If you do not take enough blood-thinning medicine, you will not have the protection that you need against stroke and other problems.  Do not use tobacco products, including cigarettes, chewing tobacco, and e-cigarettes. If you need help quitting, ask your health care provider.  If you have obstructive sleep apnea, manage your condition as told by your health care provider.  Do not drink alcohol.  Do not drink beverages that contain caffeine, such as coffee, soda, and tea.  Maintain a healthy weight. Do not use diet pills unless your health care provider approves. Diet pills may make heart problems worse.  Follow diet instructions as told by your health care provider.  Exercise regularly as told by your health care provider.  Keep all follow-up visits as told by your health care provider. This is important. PREVENTION  Avoid drinking beverages that contain caffeine or alcohol.  Avoid certain medicines, especially  medicines that are used for breathing problems.  Avoid certain herbs and herbal medicines, such as those that contain ephedra or ginseng.  Do not use illegal drugs, such as cocaine and amphetamines.  Do not smoke.  Manage your high blood pressure. SEEK MEDICAL CARE IF:  You notice a change in the rate, rhythm, or strength of your heartbeat.  You are taking an anticoagulant and you notice increased bruising.  You tire more easily when you exercise or exert yourself. SEEK IMMEDIATE MEDICAL CARE IF:  You have chest pain, abdominal pain, sweating, or weakness.  You feel nauseous.  You notice blood in your vomit, bowel movement, or urine.  You have shortness of breath.  You suddenly have swollen feet and ankles.  You feel dizzy.  You have sudden weakness or numbness of the face, arm, or leg, especially on one side of the body.  You have trouble speaking, trouble understanding, or both (aphasia).  Your face or your eyelid droops on one side. These symptoms may represent a serious problem that is an emergency. Do not wait to see if the symptoms will go away. Get medical help right away. Call your local emergency services (911 in the [place] Do not drive yourself to the hospital.   This information is not intended to replace advice given to you by your health care provider. Make sure you discuss any questions you have with your health care  provider.   Document Released: 06/30/2005 Document Revised: 03/21/2015 Document Reviewed: 10/25/2014 Elsevier Interactive Patient Education [employer].

## 2015-05-02 NOTE — Progress Notes (Signed)
PT Cancellation Note  Patient Details Name: Ryan [name] MRN: [id number] DOB: May 21, 1961   Cancelled Treatment:    Reason Eval/Treat Not Completed: Patient at procedure or test/unavailable Patient to x-ray. Will check back shortly for PT evaluation.  Ellouise Newer 05/02/2015, 1:48 PM Elayne Snare, [place]

## 2015-05-02 NOTE — Progress Notes (Signed)
Called NP for RVR at 160-170's sustaining. New order received to give his oral Cardizem and additional 5 mg IV. Patient stated I told them that I have not taken my Cardizem today. Will continue to monitor. Presently  HR at 130's-160's. Not in any  in distress noted. On and off cough. PRN tessalon perle given.
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
## 2015-05-02 NOTE — Progress Notes (Addendum)
RN, Jimmie Molly, paged because pt was in Afib with RVR, confirmed by 12 lead EKG. Pt with hx of Afib, usually rate controlled. Due to admission today, he missed his daily dose of Cardizem CD. Non symptomatic. Cardizem '5mg'$  IV push given without change in HR. Pt to be moved to a unit that can start and titrate a Cardizem drip. Pt received his daily Cardizem CD. Afib likely triggered by missed dose and/or acute illness. Clance Boll, NP [facility] Update: Prior to pt being transferred, his heart rate has decreased and is holding in the 90s. Will forego cardizem drip and continue to monitor on present floor for now.  KJKG, NP [facility]

## 2015-05-02 NOTE — Consult Note (Signed)
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  K 4.0 4.0 4.3  CL 102 100* 97*  CO2 24  --  28  GLUCOSE 135* 142* 217*  BUN _0 CREATININE 0.97 0.90 1.03  CALCIUM 8.7*  --  8.8*    Physical Exam: Filed Vitals:   05/02/15 1604  BP: 116/72  Pulse: 88  Temp: 97.6 F (36.4 C)  Resp: 17   General: Alert, no acute distress Cardiovascular: No pedal edema Respiratory: No cyanosis GI: No organomegaly, abdomen is soft and non-tender, patient has been coughing up tar colored mucous since last night Skin: No lesions in the area of chief complaint other than those listed below in [facility] exam.  Neurologic: Sensation intact distally Psychiatric: Patient is competent for consent with normal mood and affect Lymphatic: No axillary or cervical lymphadenopathy  MUSCULOSKELETAL:  L foot has no swelling.  Tenderness to palpation over the avulsion site. Full ROM of the foot.  Sensation intact with 2+ distal pulses.  Other extremities are atraumatic with painless ROM and NVI.  Assessment: L minimally displaced distal calcaneus fracture  Plan: Will have the patient be WBAT in the LLE in a CAM boot.  So indication for surgical correction.  Will have the patient follow up in our outpatient clinic in 2 weeks for repeat  xrays.  [facility] [phone]   05/02/2015 5:55 PM

## 2015-05-02 NOTE — Progress Notes (Signed)
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 217*  BUN '7 8 11  '$ CREATININE 0.97 0.90 1.03  CALCIUM 8.7*  --  8.8*  AST 20  --   --   ALT 14*  --   --   ALKPHOS 55  --   --   BILITOT 0.3  --   --    ------------------------------------------------------------------------------------------------------------------ estimated creatinine clearance is 71.9 mL/min (by C-G formula based on Cr of 1.03). ------------------------------------------------------------------------------------------------------------------  Recent Labs  05/01/15 1908  HGBA1C 7.2*   ------------------------------------------------------------------------------------------------------------------ No results for input(s): CHOL, HDL, LDLCALC, TRIG, CHOLHDL, LDLDIRECT in the last 72 hours. ------------------------------------------------------------------------------------------------------------------  Recent Labs  05/01/15 1908  TSH 5.257*   ------------------------------------------------------------------------------------------------------------------ No results for input(s): VITAMINB12, FOLATE, FERRITIN, TIBC, IRON, RETICCTPCT in the last 72  hours.  Coagulation profile  Recent Labs Lab 05/01/15 1121  INR 1.09    No results for input(s): DDIMER in the last 72 hours.  Cardiac Enzymes No results for input(s): CKMB, TROPONINI, MYOGLOBIN in the last 168 hours.  Invalid input(s): CK ------------------------------------------------------------------------------------------------------------------ Invalid input(s): POCBNP    Bradden Tadros D.O. on 05/02/2015 at 12:14 PM  Between 7am to 7pm - Pager - [phone]  After 7pm go to [email or web address] - password [email or web address]  And look for the night coverage person covering for me after hours  [facility] Office  [phone]

## 2015-05-02 NOTE — Progress Notes (Signed)
Patient spitting up black liquid in small amounts.  Dr. Ree Kida notified.  Order received for gastric occult. Order entered in to epic

## 2015-05-02 NOTE — Progress Notes (Signed)
Orthopedic Tech Progress Note Patient Details:  Ryan [name] [name] April 12, 1961 [id number]  Ortho Devices Type of Ortho Device: CAM walker Ortho Device/Splint Location: LLE Ortho Device/Splint Interventions: Ordered, Application   [name] 05/02/2015, 6:26 PM

## 2015-05-02 NOTE — Progress Notes (Signed)
Patient vomiting up small amounts of black liquid.  Dr. Ree Kida notified.

## 2015-05-03 ENCOUNTER — Encounter (HOSPITAL_COMMUNITY): Payer: Self-pay | Admitting: Certified Registered Nurse Anesthetist

## 2015-05-03 DIAGNOSIS — R55 Syncope and collapse: Secondary | ICD-10-CM | POA: Diagnosis not present

## 2015-05-03 DIAGNOSIS — I482 Chronic atrial fibrillation: Secondary | ICD-10-CM | POA: Diagnosis not present

## 2015-05-03 DIAGNOSIS — J42 Unspecified chronic bronchitis: Secondary | ICD-10-CM | POA: Diagnosis not present

## 2015-05-03 DIAGNOSIS — J189 Pneumonia, unspecified organism: Secondary | ICD-10-CM | POA: Diagnosis not present

## 2015-05-03 LAB — GLUCOSE, CAPILLARY
GLUCOSE-CAPILLARY: 169 mg/dL — AB (ref 65–99)
GLUCOSE-CAPILLARY: 175 mg/dL — AB (ref 65–99)
GLUCOSE-CAPILLARY: 206 mg/dL — AB (ref 65–99)
Glucose-Capillary: 113 mg/dL — ABNORMAL HIGH (ref 65–99)
Glucose-Capillary: 159 mg/dL — ABNORMAL HIGH (ref 65–99)
Glucose-Capillary: 212 mg/dL — ABNORMAL HIGH (ref 65–99)

## 2015-05-03 LAB — HEMOGLOBIN AND HEMATOCRIT, BLOOD
HCT: 34.2 % — ABNORMAL LOW (ref 39.0–52.0)
Hemoglobin: 11.1 g/dL — ABNORMAL LOW (ref 13.0–17.0)

## 2015-05-03 MED ORDER — INSULIN ASPART 100 UNIT/ML ~~LOC~~ SOLN
0.0000 [IU] | Freq: Every day | SUBCUTANEOUS | Status: DC
  Administered 2015-05-03: 2 [IU] via SUBCUTANEOUS

## 2015-05-03 MED ORDER — INSULIN ASPART 100 UNIT/ML ~~LOC~~ SOLN: 0.0000 [IU] | Freq: Three times a day (TID) | SUBCUTANEOUS | Status: DC

## 2015-05-03 MED ORDER — LEVOFLOXACIN 750 MG PO TABS
750.0000 mg | ORAL_TABLET | Freq: Every day | ORAL | Status: DC
  Administered 2015-05-04 – 2015-05-05 (×2): 750 mg via ORAL
  Filled 2015-05-03 (×2): qty 1

## 2015-05-03 MED ORDER — CETYLPYRIDINIUM CHLORIDE 0.05 % MT LIQD
7.0000 mL | Freq: Two times a day (BID) | OROMUCOSAL | Status: DC
  Administered 2015-05-03 – 2015-05-05 (×4): 7 mL via OROMUCOSAL

## 2015-05-03 MED ORDER — INSULIN ASPART 100 UNIT/ML ~~LOC~~ SOLN
0.0000 [IU] | Freq: Three times a day (TID) | SUBCUTANEOUS | Status: DC
  Administered 2015-05-04 (×2): 2 [IU] via SUBCUTANEOUS
  Administered 2015-05-05 (×2): 7 [IU] via SUBCUTANEOUS
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
## 2015-05-03 NOTE — Progress Notes (Signed)
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 parenchyma. No intracranial hemorrhage, mass lesion, or evidence acute infarction. No extra-axial fluid collections. Sinuses and mastoid air cells clear. Calvaria intact. CT MAXILLOFACIAL FINDINGS Visualized intracranial structures unremarkable. Intraorbital soft tissue planes clear. Soft tissue hematoma LEFT supraorbital and periorbital. Biconvex nasal septal deviation. Visualized paranasal sinuses, mastoid air cells and middle ear cavities clear. No facial bone fractures identified. CT CERVICAL SPINE FINDINGS Visualized skullbase intact. Osseous mineralization normal. Prevertebral soft tissues normal thickness. Vertebral body and disc space heights maintained. No acute fracture, subluxation or bone destruction. Emphysematous changes at lung apices. IMPRESSION: Normal CT head. LEFT periorbital hematoma. No acute facial bone abnormalities. Normal CT cervical spine. Electronically Signed   By: Lavonia Dana M.D.   On: 05/01/2015 12:58    CBC  Recent Labs Lab 05/01/15 1121 05/01/15 1129 05/02/15 0343 05/02/15 2218 05/03/15 0644  WBC 9.5  --  8.2 9.2  --   HGB 13.5 15.0 13.1 10.7* 11.1*  HCT 41.1  44.0 40.4 33.1* 34.2*  PLT 197  --  233 208  --   MCV 83.5  --  85.2 84.2  --   MCH 27.4  --  27.6 27.2  --   MCHC 32.8  --  32.4 32.3  --   RDW 14.8  --  14.9 14.6  --     Chemistries   Recent Labs Lab 05/01/15 1121 05/01/15 1129 05/02/15 0343 05/02/15 2218  NA 135 136 135 128*  K 4.0 4.0 4.3 5.1  CL 102 100* 97* 95*  CO2 24  --  28 25  GLUCOSE 135* 142* 217* 180*  BUN '7 8 11 '$ 33*  CREATININE 0.97 0.90 1.03 0.92  CALCIUM 8.7*  --  8.8* 7.9*  AST 20  --   --   --   ALT 14*  --   --   --   ALKPHOS 55  --   --   --   BILITOT 0.3  --   --   --    ------------------------------------------------------------------------------------------------------------------ estimated creatinine clearance is 80 mL/min (by C-G formula based on Cr of 0.92). ------------------------------------------------------------------------------------------------------------------  Recent Labs  05/01/15 1908  HGBA1C 7.2*   ------------------------------------------------------------------------------------------------------------------ No results for input(s): CHOL, HDL, LDLCALC, TRIG, CHOLHDL, LDLDIRECT in the last 72 hours. ------------------------------------------------------------------------------------------------------------------  Recent Labs  05/01/15 1908  TSH 5.257*   ------------------------------------------------------------------------------------------------------------------ No results for input(s): VITAMINB12, FOLATE, FERRITIN, TIBC, IRON, RETICCTPCT in the last 72 hours.  Coagulation profile  Recent Labs Lab 05/01/15 1121  INR 1.09    No  results for input(s): DDIMER in the last 72 hours.  Cardiac Enzymes No results for input(s): CKMB, TROPONINI, MYOGLOBIN in the last 168 hours.  Invalid input(s): CK ------------------------------------------------------------------------------------------------------------------ Invalid input(s): POCBNP    Shawntee Mainwaring D.O.  on 05/03/2015 at 12:36 PM  Between 7am to 7pm - Pager - [phone]  After 7pm go to [email or web address] - password [email or web address]  And look for the night coverage person covering for me after hours  [facility] Office  [phone]

## 2015-05-03 NOTE — Progress Notes (Signed)
In and out cath done x 2.  1100 clear , 1150  Clear with sediments.
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
## 2015-05-04 ENCOUNTER — Observation Stay (HOSPITAL_COMMUNITY): Payer: Medicare Other

## 2015-05-04 ENCOUNTER — Encounter (HOSPITAL_COMMUNITY): Payer: Self-pay

## 2015-05-04 ENCOUNTER — Encounter (HOSPITAL_COMMUNITY)
Admission: EM | Disposition: A | Discharge status: Home / Self Care | Payer: Self-pay | Source: Home / Self Care | Attending: Internal Medicine

## 2015-05-04 DIAGNOSIS — I482 Chronic atrial fibrillation: Secondary | ICD-10-CM | POA: Diagnosis not present

## 2015-05-04 DIAGNOSIS — R0902 Hypoxemia: Secondary | ICD-10-CM

## 2015-05-04 DIAGNOSIS — J42 Unspecified chronic bronchitis: Secondary | ICD-10-CM

## 2015-05-04 DIAGNOSIS — R55 Syncope and collapse: Secondary | ICD-10-CM | POA: Diagnosis not present

## 2015-05-04 DIAGNOSIS — R042 Hemoptysis: Secondary | ICD-10-CM

## 2015-05-04 DIAGNOSIS — R918 Other nonspecific abnormal finding of lung field: Secondary | ICD-10-CM

## 2015-05-04 DIAGNOSIS — K922 Gastrointestinal hemorrhage, unspecified: Secondary | ICD-10-CM | POA: Diagnosis present

## 2015-05-04 DIAGNOSIS — J189 Pneumonia, unspecified organism: Secondary | ICD-10-CM | POA: Diagnosis not present

## 2015-05-04 HISTORY — PX: ESOPHAGOGASTRODUODENOSCOPY: SHX5428

## 2015-05-04 LAB — CBC
HCT: 30.7 % — ABNORMAL LOW (ref 39.0–52.0)
HEMOGLOBIN: 9.8 g/dL — AB (ref 13.0–17.0)
MCH: 27.4 pg (ref 26.0–34.0)
MCHC: 31.9 g/dL (ref 30.0–36.0)
MCV: 85.8 fL (ref 78.0–100.0)
Platelets: 210 10*3/uL (ref 150–400)
RBC: 3.58 MIL/uL — ABNORMAL LOW (ref 4.22–5.81)
RDW: 15.5 % (ref 11.5–15.5)
WBC: 10.3 10*3/uL (ref 4.0–10.5)

## 2015-05-04 LAB — BASIC METABOLIC PANEL
Anion gap: 11 (ref 5–15)
BUN: 17 mg/dL (ref 6–20)
CALCIUM: 8.1 mg/dL — AB (ref 8.9–10.3)
CO2: 21 mmol/L — AB (ref 22–32)
CREATININE: 1.14 mg/dL (ref 0.61–1.24)
Chloride: 107 mmol/L (ref 101–111)
GFR calc non Af Amer: >60 mL/min (ref >60)
Glucose, Bld: 162 mg/dL — ABNORMAL HIGH (ref 65–99)
Potassium: 4.1 mmol/L (ref 3.5–5.1)
SODIUM: 139 mmol/L (ref 135–145)

## 2015-05-04 LAB — HEMOGLOBIN AND HEMATOCRIT, BLOOD
HEMATOCRIT: 28.4 % — AB (ref 39.0–52.0)
HEMOGLOBIN: 8.9 g/dL — AB (ref 13.0–17.0)

## 2015-05-04 LAB — GLUCOSE, CAPILLARY
GLUCOSE-CAPILLARY: 190 mg/dL — AB (ref 65–99)
GLUCOSE-CAPILLARY: 111 mg/dL — AB (ref 65–99)
GLUCOSE-CAPILLARY: 164 mg/dL — AB (ref 65–99)
Glucose-Capillary: 138 mg/dL — ABNORMAL HIGH (ref 65–99)
Glucose-Capillary: 212 mg/dL — ABNORMAL HIGH (ref 65–99)

## 2015-05-04 SURGERY — EGD (ESOPHAGOGASTRODUODENOSCOPY)
Anesthesia: Moderate Sedation

## 2015-05-04 SURGERY — EGD (ESOPHAGOGASTRODUODENOSCOPY)
Anesthesia: Monitor Anesthesia Care

## 2015-05-04 MED ORDER — SODIUM CHLORIDE 0.9 % IV SOLN
INTRAVENOUS | Status: DC
  Administered 2015-05-04: 500 mL via INTRAVENOUS

## 2015-05-04 MED ORDER — LEVALBUTEROL HCL 0.63 MG/3ML IN NEBU: 0.6300 mg | INHALATION_SOLUTION | RESPIRATORY_TRACT | Status: DC | PRN

## 2015-05-04 MED ORDER — MIDAZOLAM HCL 5 MG/ML IJ SOLN
INTRAMUSCULAR | Status: AC
  Filled 2015-05-04: qty 2

## 2015-05-04 MED ORDER — ARFORMOTEROL TARTRATE 15 MCG/2ML IN NEBU
15.0000 ug | INHALATION_SOLUTION | Freq: Two times a day (BID) | RESPIRATORY_TRACT | Status: DC
  Administered 2015-05-05: 15 ug via RESPIRATORY_TRACT
  Filled 2015-05-04 (×4): qty 2

## 2015-05-04 MED ORDER — FENTANYL CITRATE (PF) 100 MCG/2ML IJ SOLN
INTRAMUSCULAR | Status: AC
  Filled 2015-05-04: qty 2

## 2015-05-04 MED ORDER — MIDAZOLAM HCL 10 MG/2ML IJ SOLN
INTRAMUSCULAR | Status: DC | PRN
  Administered 2015-05-04: 2 mg via INTRAVENOUS
  Administered 2015-05-04: 1 mg via INTRAVENOUS
  Administered 2015-05-04: 2 mg via INTRAVENOUS

## 2015-05-04 MED ORDER — METHYLPREDNISOLONE SODIUM SUCC 125 MG IJ SOLR
60.0000 mg | Freq: Four times a day (QID) | INTRAMUSCULAR | Status: DC
  Administered 2015-05-04 – 2015-05-05 (×3): 60 mg via INTRAVENOUS
  Filled 2015-05-04 (×2): qty 2

## 2015-05-04 MED ORDER — PANTOPRAZOLE SODIUM 40 MG PO TBEC
40.0000 mg | DELAYED_RELEASE_TABLET | Freq: Two times a day (BID) | ORAL | Status: DC
  Administered 2015-05-04 – 2015-05-05 (×2): 40 mg via ORAL
  Filled 2015-05-04 (×2): qty 1

## 2015-05-04 MED ORDER — SODIUM CHLORIDE 0.9 % IV SOLN: INTRAVENOUS | Status: DC

## 2015-05-04 MED ORDER — BENZONATATE 100 MG PO CAPS: 100.0000 mg | ORAL_CAPSULE | Freq: Three times a day (TID) | ORAL | Status: DC | PRN

## 2015-05-04 MED ORDER — FENTANYL CITRATE (PF) 100 MCG/2ML IJ SOLN
INTRAMUSCULAR | Status: DC | PRN
  Administered 2015-05-04 (×2): 25 ug via INTRAVENOUS

## 2015-05-04 MED ORDER — LORAZEPAM 0.5 MG PO TABS: 0.5000 mg | ORAL_TABLET | Freq: Two times a day (BID) | ORAL | Status: AC | PRN

## 2015-05-04 MED ORDER — BUDESONIDE 0.5 MG/2ML IN SUSP
0.5000 mg | Freq: Two times a day (BID) | RESPIRATORY_TRACT | Status: DC
  Administered 2015-05-05: 0.5 mg via RESPIRATORY_TRACT
  Filled 2015-05-04 (×2): qty 2

## 2015-05-04 MED ORDER — BUTAMBEN-TETRACAINE-BENZOCAINE 2-2-14 % EX AERO
INHALATION_SPRAY | CUTANEOUS | Status: DC | PRN
  Administered 2015-05-04: 2 via TOPICAL

## 2015-05-04 MED ORDER — DIPHENHYDRAMINE HCL 50 MG/ML IJ SOLN
INTRAMUSCULAR | Status: AC
  Filled 2015-05-04: qty 1

## 2015-05-04 NOTE — Op Note (Signed)
[facility] [place], [address]   ENDOSCOPY PROCEDURE REPORT  PATIENT: Ryan [name], Ryan [name]  MR#: [id number] BIRTHDATE: 04-Aug-1960 , 89  yrs. old GENDER: male ENDOSCOPIST: Wilford Corner, MD REFERRED BY:  hospital team PROCEDURE DATE:  05/27/15 PROCEDURE:  EGD, diagnostic ASA CLASS:     Class III INDICATIONS:  hematemesis. MEDICATIONS: Fentanyl 50 mcg IV and Versed 5 mg IV TOPICAL ANESTHETIC: Cetacaine Spray  DESCRIPTION OF PROCEDURE: After the risks benefits and alternatives of the procedure were thoroughly explained, informed consent was obtained.  The Pentax Gastroscope [id number] endoscope was introduced through the mouth and advanced to the second portion of the duodenum , Without limitations.  The instrument was slowly withdrawn as the mucosa was fully examined. Estimated blood loss is zero unless otherwise noted in this procedure report.    Minimal edema and superficial ulceration at GEJ c/w minimal erosive esophagitis. Food products in stomach prevented complete visualization of the body of the stomach. Antrum normal. Examined duodenum normal.       Retroflexed views revealed a small hiatal hernia.     The scope was then withdrawn from the patient and the procedure completed.  COMPLICATIONS: There were no immediate complications.  ENDOSCOPIC IMPRESSION:     Minimal erosive esophagitis Small hiatal hernia No active bleeding or blood products seen  RECOMMENDATIONS:     Advance diet as tolerated; PO PPI BID    eSigned:  Wilford Corner, MD 05/27/15 3:39 PM    CC:  CPT CODES: ICD CODES:  The ICD and CPT codes recommended by this software are interpretations from the data that the clinical staff has captured with the software.  The verification of the translation of this report to the ICD and CPT codes and modifiers is the sole responsibility of the health care institution and practicing physician where this report  was generated.  [facility]. will not be held responsible for the validity of the ICD and CPT codes included on this report.  AMA assumes no liability for data contained or not contained herein. CPT is a Designer, television/film set of the [employer].  PATIENT NAME:  [name], [name] MR#: [id number]

## 2015-05-04 NOTE — Progress Notes (Signed)
Physical Therapy Cancellation Note  Off unit for procedure: ESOPHAGOGASTRODUODENOSCOPY  05/04/15 1500  PT Visit Information  Last PT Received On 05/04/15  Reason Eval/Treat Not Completed Patient at procedure or test/unavailable   Will follow-up, likely tomorrow.  [address] [place], [place]
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
## 2015-05-04 NOTE — Interval H&P Note (Signed)
History and Physical Interval Note:  05/04/2015 3:08 PM  Ryan [name]  has presented today for surgery, with the diagnosis of GI bleed  The various methods of treatment have been discussed with the patient and family. After consideration of risks, benefits and other options for treatment, the patient has consented to  Procedure(s): ESOPHAGOGASTRODUODENOSCOPY (EGD) (N/A) as a surgical intervention .  The patient's history has been reviewed, patient examined, no change in status, stable for surgery.  I have reviewed the patient's chart and labs.  Questions were answered to the patient's satisfaction.     [name]
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
## 2015-05-04 NOTE — Brief Op Note (Signed)
Minimal erosive esophagitis. Resume regular diet. No further GI workup. Will sign off. Call if questions. Dr. Oletta Lamas available to see this weekend if needed. F/U with GI prn.
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
## 2015-05-05 DIAGNOSIS — J42 Unspecified chronic bronchitis: Secondary | ICD-10-CM | POA: Diagnosis not present

## 2015-05-05 DIAGNOSIS — R55 Syncope and collapse: Secondary | ICD-10-CM | POA: Diagnosis not present

## 2015-05-05 DIAGNOSIS — J189 Pneumonia, unspecified organism: Secondary | ICD-10-CM | POA: Diagnosis not present

## 2015-05-05 DIAGNOSIS — I482 Chronic atrial fibrillation: Secondary | ICD-10-CM | POA: Diagnosis not present

## 2015-05-05 LAB — BASIC METABOLIC PANEL
Anion gap: 10 (ref 5–15)
BUN: 15 mg/dL (ref 6–20)
CALCIUM: 8.6 mg/dL — AB (ref 8.9–10.3)
CO2: 24 mmol/L (ref 22–32)
CREATININE: 1.19 mg/dL (ref 0.61–1.24)
Chloride: 101 mmol/L (ref 101–111)
GFR calc non Af Amer: >60 mL/min (ref >60)
Glucose, Bld: 315 mg/dL — ABNORMAL HIGH (ref 65–99)
Potassium: 4.8 mmol/L (ref 3.5–5.1)
SODIUM: 135 mmol/L (ref 135–145)

## 2015-05-05 LAB — CBC
HCT: 31.6 % — ABNORMAL LOW (ref 39.0–52.0)
Hemoglobin: 10.3 g/dL — ABNORMAL LOW (ref 13.0–17.0)
MCH: 28.1 pg (ref 26.0–34.0)
MCHC: 32.6 g/dL (ref 30.0–36.0)
MCV: 86.1 fL (ref 78.0–100.0)
Platelets: 218 10*3/uL (ref 150–400)
RBC: 3.67 MIL/uL — ABNORMAL LOW (ref 4.22–5.81)
RDW: 15.3 % (ref 11.5–15.5)
WBC: 6.2 10*3/uL (ref 4.0–10.5)

## 2015-05-05 LAB — GLUCOSE, CAPILLARY
GLUCOSE-CAPILLARY: 310 mg/dL — AB (ref 65–99)
GLUCOSE-CAPILLARY: 340 mg/dL — AB (ref 65–99)

## 2015-05-05 MED ORDER — LEVOFLOXACIN 750 MG PO TABS: 750.0000 mg | ORAL_TABLET | Freq: Every day | ORAL | Status: DC

## 2015-05-05 MED ORDER — PANTOPRAZOLE SODIUM 40 MG PO TBEC: 40.0000 mg | DELAYED_RELEASE_TABLET | Freq: Two times a day (BID) | ORAL | Status: DC

## 2015-05-05 MED ORDER — NICOTINE 21 MG/24HR TD PT24: 21.0000 mg | MEDICATED_PATCH | Freq: Every day | TRANSDERMAL | Status: DC
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
## 2015-05-05 NOTE — Care Management Note (Signed)
Case Management Note  Patient Details  Name: MERION GRIMALDO MRN: [id number] Date of Birth: Nov 13, 1960  Subjective/Objective:                   GI bleed Action/Plan:  Discharge planning Expected Discharge Date:  05/07/15               Expected Discharge Plan:  Home/Self Care  In-House Referral:     Discharge planning Services  CM Consult  Post Acute Care Choice:    Choice offered to:     DME Arranged:  Walker rolling DME Agency:  Ali [place]ukson:    [facility] Agency:     Status of Service:  Completed, signed off  Medicare Important Message Given:    Date Medicare IM Given:    Medicare IM give by:    Date Additional Medicare IM Given:    Additional Medicare Important Message give by:     If discussed at [place] of Stay Meetings, dates discussed:    Additional Comments: Cm received call from RN requesting rolling walker.  Cm called [facility] DME rep, Merry Proud to please deliver STAT as pt ready to be discharged.  No other CM needs were communicated. Dellie Catholic, RN 05/05/2015, 1:21 PM

## 2015-05-07 ENCOUNTER — Emergency Department (HOSPITAL_COMMUNITY)
Admission: EM | Admit: 2015-05-07 | Discharge: 2015-05-07 | Disposition: A | Discharge status: Home / Self Care | Payer: Medicare Other | Attending: Emergency Medicine | Admitting: Emergency Medicine

## 2015-05-07 ENCOUNTER — Encounter (HOSPITAL_COMMUNITY): Payer: Self-pay | Admitting: *Deleted

## 2015-05-07 ENCOUNTER — Emergency Department (HOSPITAL_COMMUNITY): Payer: Medicare Other

## 2015-05-07 DIAGNOSIS — Z862 Personal history of diseases of the blood and blood-forming organs and certain disorders involving the immune mechanism: Secondary | ICD-10-CM | POA: Insufficient documentation

## 2015-05-07 DIAGNOSIS — F0781 Postconcussional syndrome: Secondary | ICD-10-CM | POA: Diagnosis not present

## 2015-05-07 DIAGNOSIS — E119 Type 2 diabetes mellitus without complications: Secondary | ICD-10-CM | POA: Diagnosis not present

## 2015-05-07 DIAGNOSIS — R079 Chest pain, unspecified: Secondary | ICD-10-CM | POA: Diagnosis present

## 2015-05-07 DIAGNOSIS — F17210 Nicotine dependence, cigarettes, uncomplicated: Secondary | ICD-10-CM | POA: Diagnosis not present

## 2015-05-07 DIAGNOSIS — Z79899 Other long term (current) drug therapy: Secondary | ICD-10-CM | POA: Diagnosis not present

## 2015-05-07 DIAGNOSIS — G8929 Other chronic pain: Secondary | ICD-10-CM | POA: Diagnosis not present

## 2015-05-07 DIAGNOSIS — K219 Gastro-esophageal reflux disease without esophagitis: Secondary | ICD-10-CM | POA: Insufficient documentation

## 2015-05-07 DIAGNOSIS — R0789 Other chest pain: Secondary | ICD-10-CM | POA: Insufficient documentation

## 2015-05-07 DIAGNOSIS — Z8739 Personal history of other diseases of the musculoskeletal system and connective tissue: Secondary | ICD-10-CM | POA: Diagnosis not present

## 2015-05-07 DIAGNOSIS — J449 Chronic obstructive pulmonary disease, unspecified: Secondary | ICD-10-CM | POA: Diagnosis not present

## 2015-05-07 DIAGNOSIS — Z792 Long term (current) use of antibiotics: Secondary | ICD-10-CM | POA: Diagnosis not present

## 2015-05-07 DIAGNOSIS — I2510 Atherosclerotic heart disease of native coronary artery without angina pectoris: Secondary | ICD-10-CM | POA: Diagnosis not present

## 2015-05-07 DIAGNOSIS — Z8701 Personal history of pneumonia (recurrent): Secondary | ICD-10-CM | POA: Diagnosis not present

## 2015-05-07 DIAGNOSIS — Z7951 Long term (current) use of inhaled steroids: Secondary | ICD-10-CM | POA: Insufficient documentation

## 2015-05-07 DIAGNOSIS — G44309 Post-traumatic headache, unspecified, not intractable: Secondary | ICD-10-CM

## 2015-05-07 LAB — BASIC METABOLIC PANEL
Anion gap: 8 (ref 5–15)
BUN: 26 mg/dL — AB (ref 6–20)
CO2: 26 mmol/L (ref 22–32)
CREATININE: 1.12 mg/dL (ref 0.61–1.24)
Calcium: 8.5 mg/dL — ABNORMAL LOW (ref 8.9–10.3)
Chloride: 102 mmol/L (ref 101–111)
GFR calc Af Amer: >60 mL/min (ref >60)
GLUCOSE: 134 mg/dL — AB (ref 65–99)
POTASSIUM: 3.7 mmol/L (ref 3.5–5.1)
SODIUM: 136 mmol/L (ref 135–145)

## 2015-05-07 LAB — CBC WITH DIFFERENTIAL/PLATELET
Basophils Absolute: 0 10*3/uL (ref 0.0–0.1)
Basophils Relative: 0 %
EOS ABS: 0.2 10*3/uL (ref 0.0–0.7)
EOS PCT: 2 %
HCT: 31.8 % — ABNORMAL LOW (ref 39.0–52.0)
Hemoglobin: 10.3 g/dL — ABNORMAL LOW (ref 13.0–17.0)
LYMPHS ABS: 4.5 10*3/uL — AB (ref 0.7–4.0)
LYMPHS PCT: 45 %
MCH: 27.7 pg (ref 26.0–34.0)
MCHC: 32.4 g/dL (ref 30.0–36.0)
MCV: 85.5 fL (ref 78.0–100.0)
MONO ABS: 0.9 10*3/uL (ref 0.1–1.0)
MONOS PCT: 9 %
Neutro Abs: 4.6 10*3/uL (ref 1.7–7.7)
Neutrophils Relative %: 45 %
PLATELETS: 252 10*3/uL (ref 150–400)
RBC: 3.72 MIL/uL — AB (ref 4.22–5.81)
RDW: 15.6 % — ABNORMAL HIGH (ref 11.5–15.5)
WBC: 10.1 10*3/uL (ref 4.0–10.5)

## 2015-05-07 LAB — I-STAT TROPONIN, ED: Troponin i, poc: 0 ng/mL (ref 0.00–0.08)

## 2015-05-07 LAB — TROPONIN I: Troponin I: <0.03 ng/mL (ref <0.031)

## 2015-05-07 LAB — BRAIN NATRIURETIC PEPTIDE: B Natriuretic Peptide: 83 pg/mL (ref 0.0–100.0)

## 2015-05-07 MED ORDER — HYDROMORPHONE HCL 1 MG/ML IJ SOLN
1.0000 mg | Freq: Once | INTRAMUSCULAR | Status: AC
  Administered 2015-05-07: 1 mg via INTRAVENOUS

## 2015-05-07 MED ORDER — HYDROMORPHONE HCL 1 MG/ML IJ SOLN
INTRAMUSCULAR | Status: AC
  Filled 2015-05-07: qty 1

## 2015-05-07 MED ORDER — FENTANYL CITRATE (PF) 100 MCG/2ML IJ SOLN
INTRAMUSCULAR | Status: AC
  Filled 2015-05-07: qty 2

## 2015-05-07 MED ORDER — FENTANYL CITRATE (PF) 100 MCG/2ML IJ SOLN
50.0000 ug | Freq: Once | INTRAMUSCULAR | Status: AC
  Administered 2015-05-07: 50 ug via INTRAVENOUS

## 2015-05-07 MED ORDER — SODIUM CHLORIDE 0.9 % IV BOLUS (SEPSIS)
1000.0000 mL | Freq: Once | INTRAVENOUS | Status: AC
  Administered 2015-05-07: 1000 mL via INTRAVENOUS

## 2015-05-07 NOTE — ED Notes (Signed)
Pt states his headache is gone.  No change in his chest pain.

## 2015-05-07 NOTE — Discharge Instructions (Signed)
Chest Wall Pain Chest wall pain is pain in or around the bones and muscles of your chest. Sometimes, an injury causes this pain. Sometimes, the cause may not be known. This pain may take several weeks or longer to get better. HOME CARE INSTRUCTIONS  Pay attention to any changes in your symptoms. Take these actions to help with your pain:   Rest as told by your health care provider.   Avoid activities that cause pain. These include any activities that use your chest muscles or your abdominal and side muscles to lift heavy items.   If directed, apply ice to the painful area:  Put ice in a plastic bag.  Place a towel between your skin and the bag.  Leave the ice on for 20 minutes, 2-3 times per day.  Take over-the-counter and prescription medicines only as told by your health care provider.  Do not use tobacco products, including cigarettes, chewing tobacco, and e-cigarettes. If you need help quitting, ask your health care provider.  Keep all follow-up visits as told by your health care provider. This is important. SEEK MEDICAL CARE IF:  You have a fever.  Your chest pain becomes worse.  You have new symptoms. SEEK IMMEDIATE MEDICAL CARE IF:  You have nausea or vomiting.  You feel sweaty or light-headed.  You have a cough with phlegm (sputum) or you cough up blood.  You develop shortness of breath.   This information is not intended to replace advice given to you by your health care provider. Make sure you discuss any questions you have with your health care provider.   Document Released: 06/30/2005 Document Revised: 03/21/2015 Document Reviewed: 09/25/2014 Elsevier Interactive Patient Education 2016 [employer]  Post-Concussion Syndrome Post-concussion syndrome describes the symptoms that can occur after a head injury. These symptoms can last from weeks to months. CAUSES  It is not clear why some head injuries cause post-concussion syndrome. It can occur whether  your head injury was mild or severe and whether you were wearing head protection or not.  SIGNS AND SYMPTOMS  Memory difficulties.  Dizziness.  Headaches.  Double vision or blurry vision.  Sensitivity to light.  Hearing difficulties.  Depression.  Tiredness.  Weakness.  Difficulty with concentration.  Difficulty sleeping or staying asleep.  Vomiting.  Poor balance or instability on your feet.  Slow reaction time.  Difficulty learning and remembering things you have heard. DIAGNOSIS  There is no test to determine whether you have post-concussion syndrome. Your health care provider may order an imaging scan of your brain, such as a CT scan, to check for other problems that may be causing your symptoms (such as a severe injury inside your skull). TREATMENT  Usually, these problems disappear over time without medical care. Your health care provider may prescribe medicine to help ease your symptoms. It is important to follow up with a neurologist to evaluate your recovery and address any lingering symptoms or issues. HOME CARE INSTRUCTIONS   Take medicines only as directed by your health care provider. Do not take aspirin. Aspirin can slow blood clotting.  Sleep with your head slightly elevated to help with headaches.  Avoid any situation where there is potential for another head injury. This includes football, hockey, soccer, basketball, martial arts, downhill snow sports, and horseback riding. Your condition will get worse every time you experience a concussion. You should avoid these activities until you are evaluated by the appropriate follow-up health care providers.  Keep all follow-up visits as directed  by your health care provider. This is important. SEEK MEDICAL CARE IF:  You have increased problems paying attention or concentrating.  You have increased difficulty remembering or learning new information.  You need more time to complete tasks or assignments than  before.  You have increased irritability or decreased ability to cope with stress.  You have more symptoms than before. Seek medical care if you have any of the following symptoms for more than two weeks after your injury:  Lasting (chronic) headaches.  Dizziness or balance problems.  Nausea.  Vision problems.  Increased sensitivity to noise or light.  Depression or mood swings.  Anxiety or irritability.  Memory problems.  Difficulty concentrating or paying attention.  Sleep problems.  Feeling tired all the time. SEEK IMMEDIATE MEDICAL CARE IF:  You have confusion or unusual drowsiness.  Others find it difficult to wake you up.  You have nausea or persistent, forceful vomiting.  You feel like you are moving when you are not (vertigo). Your eyes may move rapidly back and forth.  You have convulsions or faint.  You have severe, persistent headaches that are not relieved by medicine.  You cannot use your arms or legs normally.  One of your pupils is larger than the other.  You have clear or bloody discharge from your nose or ears.  Your problems are getting worse, not better. MAKE SURE YOU:  Understand these instructions.  Will watch your condition.  Will get help right away if you are not doing well or get worse.   This information is not intended to replace advice given to you by your health care provider. Make sure you discuss any questions you have with your health care provider.   Document Released: 12/20/2001 Document Revised: 07/21/2014 Document Reviewed: 10/05/2013 Elsevier Interactive Patient Education [employer].
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
## 2015-05-07 NOTE — ED Notes (Signed)
Pt waiting for spouse to come from [place] to bring home
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
## 2015-05-07 NOTE — ED Notes (Signed)
Pt and family updated, lights dimmed for comfort,
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
## 2015-05-10 ENCOUNTER — Ambulatory Visit (INDEPENDENT_AMBULATORY_CARE_PROVIDER_SITE_OTHER): Payer: Medicare Other | Admitting: Internal Medicine

## 2015-05-10 ENCOUNTER — Encounter: Payer: Self-pay | Admitting: Internal Medicine

## 2015-05-10 VITALS — BP 100/60 | HR 74 | Ht 68.0 in | Wt 152.0 lb

## 2015-05-10 DIAGNOSIS — J8410 Pulmonary fibrosis, unspecified: Secondary | ICD-10-CM

## 2015-05-10 DIAGNOSIS — R911 Solitary pulmonary nodule: Secondary | ICD-10-CM | POA: Diagnosis not present

## 2015-05-10 DIAGNOSIS — J9611 Chronic respiratory failure with hypoxia: Secondary | ICD-10-CM | POA: Diagnosis not present

## 2015-05-10 DIAGNOSIS — J449 Chronic obstructive pulmonary disease, unspecified: Secondary | ICD-10-CM | POA: Diagnosis not present

## 2015-05-10 HISTORY — DX: Pulmonary fibrosis, unspecified: J84.10

## 2015-05-10 NOTE — Assessment & Plan Note (Signed)
DDx for pulmonary fibrosis  includes idiopathic pulmonary fibrosis, pulmonary fibrosis associated with rheumatologic diseases (which have a relatively benign course in most cases) , adverse effect from  drugs such as chemotherapy or amiodarone exposure, nonspecific interstitial pneumonia which is typically steroid responsive, pneumoconiosis including asbestosis, esp with hx exp hx, and chronic hypersensitivity pneumonitis.   In active  smokers Langerhan's Cell  Histiocyctosis (eosinophilic granuomatosis),  DIP,  and Respiratory Bronchiolitis ILD also need to be considered, and of course  the   treatment for these is to stop smoking/ stay quit, reviewed  At this point he does not need and the anti-fibrotic therapy but rather focus on maintaining oxygen cigarettes and see how he does over the next several months. If Dr. Luan Pulling would like my input on this problem I be happy to see him back for it but will leave it up to Dr Luan Pulling.

## 2015-05-10 NOTE — Patient Instructions (Signed)
Plan A = Automatic = symbicort 2 puffs in am / spiriva 2 puffs in am then symbiocorts x 2 pffs 12 hours late  Plan B = Backup Only use your albuterol/ventolin as a rescue medication to be used if you can't catch your breath by resting or doing a relaxed purse lip breathing pattern.  - The less you use it, the better it will work when you need it. - Ok to use up to 2 puffs  every 4 hours if you must but call for immediate appointment if use goes up over your usual need - Don't leave home without it !!  (think of it like the spare tire for your car)  Plan C = Crisis - only use nebulizer xopenex   if you try B first and it doesn't work up to every 6 hours if needed   We will call in 6 months to be sure you get your follow up scan - key in meantime is NO SMOKING - good luck!!!

## 2015-05-10 NOTE — Assessment & Plan Note (Signed)
3lpm Nasal 02 chronically s hypercarbia   Adequate control on present rx, reviewed > no change in rx needed
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
## 2015-05-10 NOTE — Assessment & Plan Note (Addendum)
PFT's  03/02/08  FEV1 1.78 (52 % ) ratio 39  p no % improvement from saba with DLCO  46 % corrects to 42 % for alv volume   - quit smoking 05/01/15    - d/c all dpi's due to cough 05/10/2015    DDX of  difficult airways management all start with A and  include Adherence, Ace Inhibitors, Acid Reflux, Active Sinus Disease, Alpha 1 Antitripsin deficiency, Anxiety masquerading as Airways dz,  ABPA,  allergy(esp in young), Aspiration (esp in elderly), Adverse effects of meds,  Active smokers, A bunch of PE's (a small clot burden can't cause this syndrome unless there is already severe underlying pulm or vascular dz with poor reserve) plus two Bs  = Bronchiectasis and Beta blocker use..and one C= CHF   Adherence is always the initial "prime suspect" and is a multilayered concern that requires a "trust but verify" approach in every patient - starting with knowing how to use medications, especially inhalers, correctly, keeping up with refills and understanding the fundamental difference between maintenance and prns vs those medications only taken for a very short course and then stopped and not refilled.  -The proper method of use, as well as anticipated side effects, of a metered-dose inhaler are discussed and demonstrated to the patient. Improved effectiveness after extensive coaching during this visit to a level of approximately  75% so change maint rx  to Symbicort 160 2 puffs every 12 hours and Spiriva Respimat 2 puffs each a.m.  ? Active smoking > denies since admission that he smoked at all. Reinforced the importance of maintaining smoking abstinence.  I had an extended discussion with the patient reviewing all relevant studies completed to date and  lasting 25 minutes of a 40 minute visit    Each maintenance medication was reviewed in detail including most importantly the difference between maintenance and prns and under what circumstances the prns are to be triggered using an action plan format that is  not reflected in the computer generated alphabetically organized AVS.    Please see instructions for details which were reviewed in writing and the patient given a copy highlighting the part that I personally wrote and discussed at today's ov.

## 2015-05-10 NOTE — Assessment & Plan Note (Signed)
This problem is artery being followed by Dr. Luan Pulling.CT results reviewed with pt >>> Too small for PET or bx, not suspicious enough for excisional bx > really only option for now is follow the [employer] guidelines as rec by radiology.  We will place it in a tickle file to be sure about end of 6 months these had a comparison study

## 2015-05-11 ENCOUNTER — Encounter: Payer: Self-pay | Admitting: Adult Health

## 2015-05-11 ENCOUNTER — Ambulatory Visit (INDEPENDENT_AMBULATORY_CARE_PROVIDER_SITE_OTHER): Payer: Medicare Other | Admitting: Adult Health

## 2015-05-11 VITALS — BP 104/64 | HR 75 | Ht 66.0 in | Wt 151.6 lb

## 2015-05-11 DIAGNOSIS — I481 Persistent atrial fibrillation: Secondary | ICD-10-CM

## 2015-05-11 DIAGNOSIS — Z79899 Other long term (current) drug therapy: Secondary | ICD-10-CM | POA: Diagnosis not present

## 2015-05-11 DIAGNOSIS — I4819 Other persistent atrial fibrillation: Secondary | ICD-10-CM

## 2015-05-11 MED ORDER — EDOXABAN TOSYLATE 60 MG PO TABS: 60.0000 mg | ORAL_TABLET | Freq: Every day | ORAL | Status: DC

## 2015-05-11 MED ORDER — DILTIAZEM HCL ER COATED BEADS 120 MG PO CP24: 120.0000 mg | ORAL_CAPSULE | Freq: Every day | ORAL | Status: DC
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
## 2015-05-11 NOTE — Patient Instructions (Signed)
Your physician wants you to follow-up in: 6 months with Dr. Harl Bowie. You will receive a reminder letter in the mail two months in advance. If you don't receive a letter, please call our office to schedule the follow-up appointment.  Your physician recommends that you continue on your current medications as directed. Please refer to the Current Medication list given to you today.  Your physician recommends that you return for lab work in: 3 months (1- 27-17)   If you need a refill on your cardiac medications before your next appointment, please call your pharmacy.  Thank you for choosing [facility]!

## 2015-05-20 ENCOUNTER — Encounter (HOSPITAL_COMMUNITY): Payer: Self-pay | Admitting: Emergency Medicine

## 2015-05-20 ENCOUNTER — Emergency Department (INDEPENDENT_AMBULATORY_CARE_PROVIDER_SITE_OTHER)
Admission: EM | Admit: 2015-05-20 | Discharge: 2015-05-20 | Disposition: A | Discharge status: Home / Self Care | Payer: Medicare Other | Source: Home / Self Care

## 2015-05-20 ENCOUNTER — Emergency Department (INDEPENDENT_AMBULATORY_CARE_PROVIDER_SITE_OTHER): Payer: Medicare Other

## 2015-05-20 DIAGNOSIS — M7022 Olecranon bursitis, left elbow: Secondary | ICD-10-CM

## 2015-05-20 MED ORDER — PREDNISONE 10 MG PO TABS: 20.0000 mg | ORAL_TABLET | Freq: Every day | ORAL | Status: DC

## 2015-05-20 NOTE — Discharge Instructions (Signed)
Elbow Bursitis Use ace wrap and apply moist heat compresses to your elbow If redness or worsening of pain develops you will need to return or follow up with your PCP Take prednisone as directed A bursa is a fluid-filled sac that covers and protects a joint. Bursitis is when the fluid-filled sac gets puffy and sore (inflamed). Elbow bursitis, also called olecranon bursitis, happens over your elbow. This may be caused by:  Injury (acute trauma) to your elbow.   Leaning on hard surfaces for long periods of time.   Infection from an injury that breaks the skin near your elbow.  A bone growth (spur) that forms at the tip of your elbow.   A medical condition that causes inflammation in your body, such as:  Gout.  Rheumatoid arthritis. Sometimes the cause is not known. HOME CARE  Take medicines only as told by your doctor.  If you were prescribed an antibiotic medicine, finish all of it even if you start to feel better.   If your bursitis is caused by an injury, rest your elbow and wear your bandage as told by your doctor. You may also apply ice to the injured area as told by your doctor:   Put ice in a plastic bag.   Place a towel between your skin and the bag.   Leave the ice on for 20 minutes, 2-3 times per day.   Do not do any activities that cause pain to your elbow.  Use elbow pads or wraps to cushion your elbow.  GET HELP IF:  You have a fever.   Your symptoms do not get better with treatment.   Your pain or swelling gets worse.   Your pain or swelling goes away and then comes back.  You have drainage of pus from the swollen area over your elbow.   This information is not intended to replace advice given to you by your health care provider. Make sure you discuss any questions you have with your health care provider.   Document Released: 12/18/2009 Document Revised: 03/21/2015 Document Reviewed: 03/08/2014 Elsevier Interactive Patient Education [employer]
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
## 2015-06-19 ENCOUNTER — Ambulatory Visit (HOSPITAL_COMMUNITY)
Admission: RE | Admit: 2015-06-19 | Discharge: 2015-06-19 | Disposition: A | Discharge status: Home / Self Care | Payer: Medicare Other | Source: Ambulatory Visit | Attending: Pulmonary Disease | Admitting: Pulmonary Disease

## 2015-06-19 ENCOUNTER — Other Ambulatory Visit (HOSPITAL_COMMUNITY): Payer: Self-pay | Admitting: Pulmonary Disease

## 2015-06-19 DIAGNOSIS — M25511 Pain in right shoulder: Secondary | ICD-10-CM | POA: Insufficient documentation

## 2015-06-19 DIAGNOSIS — M79601 Pain in right arm: Secondary | ICD-10-CM | POA: Insufficient documentation

## 2015-06-19 DIAGNOSIS — R52 Pain, unspecified: Secondary | ICD-10-CM

## 2015-06-30 ENCOUNTER — Encounter (HOSPITAL_COMMUNITY): Payer: Self-pay | Admitting: *Deleted

## 2015-06-30 ENCOUNTER — Emergency Department (HOSPITAL_COMMUNITY)
Admission: EM | Admit: 2015-06-30 | Discharge: 2015-07-01 | Disposition: A | Discharge status: Home / Self Care | Payer: Medicare Other | Attending: Emergency Medicine | Admitting: Emergency Medicine

## 2015-06-30 DIAGNOSIS — J441 Chronic obstructive pulmonary disease with (acute) exacerbation: Secondary | ICD-10-CM | POA: Diagnosis not present

## 2015-06-30 DIAGNOSIS — Z79899 Other long term (current) drug therapy: Secondary | ICD-10-CM | POA: Diagnosis not present

## 2015-06-30 DIAGNOSIS — K219 Gastro-esophageal reflux disease without esophagitis: Secondary | ICD-10-CM | POA: Diagnosis not present

## 2015-06-30 DIAGNOSIS — R0602 Shortness of breath: Secondary | ICD-10-CM | POA: Diagnosis present

## 2015-06-30 DIAGNOSIS — E119 Type 2 diabetes mellitus without complications: Secondary | ICD-10-CM | POA: Diagnosis not present

## 2015-06-30 DIAGNOSIS — Z7951 Long term (current) use of inhaled steroids: Secondary | ICD-10-CM | POA: Insufficient documentation

## 2015-06-30 DIAGNOSIS — G8929 Other chronic pain: Secondary | ICD-10-CM | POA: Insufficient documentation

## 2015-06-30 DIAGNOSIS — I4891 Unspecified atrial fibrillation: Secondary | ICD-10-CM | POA: Diagnosis not present

## 2015-06-30 DIAGNOSIS — Z87891 Personal history of nicotine dependence: Secondary | ICD-10-CM | POA: Diagnosis not present

## 2015-06-30 DIAGNOSIS — Z862 Personal history of diseases of the blood and blood-forming organs and certain disorders involving the immune mechanism: Secondary | ICD-10-CM | POA: Insufficient documentation

## 2015-06-30 DIAGNOSIS — Z8701 Personal history of pneumonia (recurrent): Secondary | ICD-10-CM | POA: Insufficient documentation

## 2015-06-30 DIAGNOSIS — Z7901 Long term (current) use of anticoagulants: Secondary | ICD-10-CM | POA: Insufficient documentation

## 2015-06-30 DIAGNOSIS — I2510 Atherosclerotic heart disease of native coronary artery without angina pectoris: Secondary | ICD-10-CM | POA: Diagnosis not present

## 2015-06-30 NOTE — ED Notes (Signed)
EKG done and patient on cardiac monitor.

## 2015-06-30 NOTE — ED Notes (Signed)
Pt c/o sob, cough that productive with clear sputum, chest pain that started yesterday, pt states that the pain is located left  Lower rib cage/left upper abd area,

## 2015-07-01 ENCOUNTER — Emergency Department (HOSPITAL_COMMUNITY): Payer: Medicare Other

## 2015-07-01 LAB — CBC WITH DIFFERENTIAL/PLATELET
Basophils Absolute: 0 10*3/uL (ref 0.0–0.1)
Basophils Relative: 0 %
Eosinophils Absolute: 0.1 10*3/uL (ref 0.0–0.7)
Eosinophils Relative: 1 %
HEMATOCRIT: 38 % — AB (ref 39.0–52.0)
Hemoglobin: 11.9 g/dL — ABNORMAL LOW (ref 13.0–17.0)
LYMPHS PCT: 27 %
Lymphs Abs: 2.2 10*3/uL (ref 0.7–4.0)
MCH: 24.3 pg — ABNORMAL LOW (ref 26.0–34.0)
MCHC: 31.3 g/dL (ref 30.0–36.0)
MCV: 77.6 fL — AB (ref 78.0–100.0)
MONO ABS: 0.8 10*3/uL (ref 0.1–1.0)
MONOS PCT: 10 %
NEUTROS ABS: 4.9 10*3/uL (ref 1.7–7.7)
Neutrophils Relative %: 62 %
Platelets: 313 10*3/uL (ref 150–400)
RBC: 4.9 MIL/uL (ref 4.22–5.81)
RDW: 15.3 % (ref 11.5–15.5)
WBC: 8.1 10*3/uL (ref 4.0–10.5)

## 2015-07-01 LAB — BASIC METABOLIC PANEL
Anion gap: 12 (ref 5–15)
BUN: 14 mg/dL (ref 6–20)
CALCIUM: 9 mg/dL (ref 8.9–10.3)
CO2: 27 mmol/L (ref 22–32)
CREATININE: 0.85 mg/dL (ref 0.61–1.24)
Chloride: 100 mmol/L — ABNORMAL LOW (ref 101–111)
GFR calc Af Amer: >60 mL/min (ref >60)
GFR calc non Af Amer: >60 mL/min (ref >60)
GLUCOSE: 201 mg/dL — AB (ref 65–99)
Potassium: 3.6 mmol/L (ref 3.5–5.1)
Sodium: 139 mmol/L (ref 135–145)

## 2015-07-01 LAB — TROPONIN I: Troponin I: <0.03 ng/mL (ref <0.031)

## 2015-07-01 MED ORDER — METHYLPREDNISOLONE SODIUM SUCC 125 MG IJ SOLR
80.0000 mg | Freq: Once | INTRAMUSCULAR | Status: AC
  Administered 2015-07-01: 80 mg via INTRAVENOUS
  Filled 2015-07-01: qty 2

## 2015-07-01 MED ORDER — IPRATROPIUM-ALBUTEROL 0.5-2.5 (3) MG/3ML IN SOLN
3.0000 mL | Freq: Once | RESPIRATORY_TRACT | Status: AC
  Administered 2015-07-01: 3 mL via RESPIRATORY_TRACT
  Filled 2015-07-01: qty 3

## 2015-07-01 MED ORDER — KETOROLAC TROMETHAMINE 30 MG/ML IJ SOLN
30.0000 mg | Freq: Once | INTRAMUSCULAR | Status: AC
  Administered 2015-07-01: 30 mg via INTRAVENOUS
  Filled 2015-07-01: qty 1

## 2015-07-01 MED ORDER — HYDROCODONE-ACETAMINOPHEN 5-325 MG PO TABS
1.0000 | ORAL_TABLET | Freq: Once | ORAL | Status: AC
  Administered 2015-07-01: 1 via ORAL
  Filled 2015-07-01: qty 1

## 2015-07-01 MED ORDER — PREDNISONE 20 MG PO TABS: 40.0000 mg | ORAL_TABLET | Freq: Every day | ORAL | Status: DC

## 2015-07-01 NOTE — ED Notes (Signed)
Patient verbalizes understanding of discharge instructions, prescription medications, home care and follow up care. Patient out of department at this time with family. 

## 2015-07-01 NOTE — ED Provider Notes (Signed)
CSN: [id number]     Arrival date & time 06/30/15  2325 History   First MD Initiated Contact with Patient 06/30/15 2342     Chief Complaint  Patient presents with  . Shortness of Breath     (Consider location/radiation/quality/duration/timing/severity/associated sxs/prior Treatment) HPI  This is a 54 year old male with history COPD, hypercholesterolemia, coronary artery disease who presents with shortness of breath, cough, and chest pain. Onset of symptoms yesterday. He is on 2 L of home oxygen at baseline.  Reports that he increased at home because his oxygen sats were going into the 80s. Reports chills but denies fevers. States that his inhalers are not helping. Cough is productive of clear sputum. Reports pain over the left lower chest which is sharp in nature. It is nonradiating.  Current pain is 8 out of 10.  Denies nausea, vomiting.  Past Medical History  Diagnosis Date  . COPD (chronic obstructive pulmonary disease) ([place])   . Hypercholesteremia   . History of pneumonia   . Polycythemia   . Coronary atherosclerosis of native coronary artery     Mild nonobstructive disease at catheterization 2007  . 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 Borderline diabetes   . Seizures ([place])     Last seizure 2 yrs ago  . GERD (gastroesophageal reflux disease)   . Type 2 diabetes mellitus ([place])   . DDD (degenerative disc disease)     Cervical and thoracic  . Chronic back pain   . Bronchitis   . Chronic left shoulder pain   . Chronic neck pain   . Atrial fibrillation ([place])     Not anticoagulated  . Smoker   . Type 2 diabetes mellitus without complication ([place])   . CAP (community acquired pneumonia) 07/10/2013    04/2015  . Collagen vascular disease ([place])   . Chronic respiratory failure [facility])    Past Surgical History  Procedure Laterality Date  . Colonoscopy w/ endoscopic [place]    . Vasectomy    . Throat biopsy    . Lung biopsy    . Vasectomy  1987   . Esophagogastroduodenoscopy N/A 05/04/2015    Procedure: ESOPHAGOGASTRODUODENOSCOPY (EGD);  Surgeon: Wilford Corner, MD;  Location: [facility] ENDOSCOPY;  Service: Endoscopy;  Laterality: N/A;   Family History  Problem Relation Age of Onset  . Hypertension Mother   . Diabetes Mother   . Heart attack Mother   . Heart failure Mother   . Hypertension Sister   . Diabetes Sister   . Heart failure Sister    Social History  Substance Use Topics  . Smoking status: Former Smoker -- 1.00 packs/day for 45 years    Types: Cigarettes    Quit date: 05/01/2015  . Smokeless tobacco: Never Used  . Alcohol Use: No    Review of Systems  Constitutional: Positive for chills. Negative for fever.  Respiratory: Positive for cough and shortness of breath. Negative for chest tightness.   Cardiovascular: Positive for chest pain. Negative for leg swelling.  Gastrointestinal: Negative.  Negative for nausea, vomiting and abdominal pain.  Genitourinary: Negative.  Negative for dysuria.  Neurological: Negative for headaches.  All other systems reviewed and are negative.     Allergies  Albuterol and Influenza vaccine live  Home Medications   Prior to Admission medications   Medication Sig Start Date End Date Taking? Authorizing Provider  oxyCODONE-acetaminophen (PERCOCET) 10-325 MG tablet Take 1 tablet by mouth every 4 (four) hours as  needed for pain.   Yes Historical Provider, MD  albuterol (VENTOLIN HFA) 108 (90 BASE) MCG/ACT inhaler Inhale 2 puffs into the lungs every 6 (six) hours as needed for wheezing or shortness of breath.    Historical Provider, MD  benzonatate (TESSALON) 100 MG capsule Take 1 capsule (100 mg total) by mouth 3 (three) times daily as needed for cough. 05/04/15   Maryann Mikhail, DO  budesonide-formoterol (SYMBICORT) 160-4.5 MCG/ACT inhaler Inhale 2 puffs into the lungs 2 (two) times daily.    Historical Provider, MD  Dextromethorphan-Guaifenesin 20-400 MG TABS Take 1 tablet by mouth  every 6 (six) hours as needed (mucus).    Historical Provider, MD  diltiazem (CARDIZEM CD) 120 MG 24 hr capsule Take 1 capsule (120 mg total) by mouth daily. 05/11/15   Lendon Colonel, NP  edoxaban (SAVAYSA) 60 MG TABS tablet Take 60 mg by mouth daily. 05/11/15   Lendon Colonel, NP  fluticasone (FLONASE) 50 MCG/ACT nasal spray Place 2 sprays into both nostrils daily.    Historical Provider, MD  GuaiFENesin (MUCUS RELIEF ADULT PO) Take 1 tablet by mouth every 6 (six) hours as needed (mucus).     Historical Provider, MD  levalbuterol Penne Lash) 0.63 MG/3ML nebulizer solution Take 3 mLs (0.63 mg total) by nebulization 3 (three) times daily. 09/15/14   Rexene Alberts, MD  LORazepam (ATIVAN) 0.5 MG tablet Take 1 tablet (0.5 mg total) by mouth 2 (two) times daily as needed for anxiety or sleep. 05/04/15   Maryann Mikhail, DO  metFORMIN (GLUCOPHAGE) 500 MG tablet Take 1 tablet (500 mg total) by mouth daily with breakfast. 09/15/14   Rexene Alberts, MD  montelukast (SINGULAIR) 10 MG tablet Take 10 mg by mouth at bedtime.    Historical Provider, MD  nicotine (NICODERM CQ - DOSED IN MG/24 HOURS) 21 mg/24hr patch Place 1 patch (21 mg total) onto the skin daily. 05/05/15   Maryann Mikhail, DO  nitroGLYCERIN (NITROSTAT) 0.4 MG SL tablet Place 1 tablet (0.4 mg total) under the tongue every 5 (five) minutes as needed for chest pain. 04/22/13   Lendon Colonel, NP  oxyCODONE-acetaminophen (ROXICET) 5-325 MG per tablet Take 1 tablet by mouth every 4 (four) hours as needed for severe pain. 09/15/14   Rexene Alberts, MD  pantoprazole (PROTONIX) 40 MG tablet Take 1 tablet (40 mg total) by mouth 2 (two) times daily. 05/05/15   Maryann Mikhail, DO  predniSONE (DELTASONE) 20 MG tablet Take 2 tablets (40 mg total) by mouth daily. 07/01/15   Merryl Hacker, MD   BP 127/78 mmHg  Pulse 81  Temp(Src) 98.4 F (36.9 C) (Oral)  Resp 22  Ht '5\' 8"'$  (1.727 m)  Wt 144 lb (65.318 kg)  BMI 21.90 kg/m2  SpO2 94% Physical Exam   Constitutional: He is oriented to person, place, and time. No distress.  Chronically ill-appearing, no acute distress  HENT:  Head: Normocephalic and atraumatic.  Eyes: Pupils are equal, round, and reactive to light.  Cardiovascular: Normal rate, regular rhythm and normal heart sounds.   No murmur heard. Pulmonary/Chest: Effort normal. No respiratory distress. He has wheezes.  Fair air movement, expiratory squeaking noted, nasal cannula in place  Abdominal: Soft. Bowel sounds are normal. There is no tenderness. There is no rebound.  Musculoskeletal: He exhibits no edema.  Neurological: He is alert and oriented to person, place, and time.  Skin: Skin is warm and dry.  Psychiatric: He has a normal mood and affect.  Nursing note and vitals  reviewed.   ED Course  Procedures (including critical care time) Labs Review Labs Reviewed  CBC WITH DIFFERENTIAL/PLATELET - Abnormal; Notable for the following:    Hemoglobin 11.9 (*)    HCT 38.0 (*)    MCV 77.6 (*)    MCH 24.3 (*)    All other components within normal limits  BASIC METABOLIC PANEL - Abnormal; Notable for the following:    Chloride 100 (*)    Glucose, Bld 201 (*)    All other components within normal limits  TROPONIN I    Imaging Review Dg Chest 2 View  07/01/2015  CLINICAL DATA:  Shortness of breath. Left-sided chest pain and cough for 3 days. History of diabetes, COPD, emphysema, GERD, bronchitis, community acquired pneumonia, coronary atherosclerosis, and smoker. EXAM: CHEST  2 VIEW COMPARISON:  05/07/2015 chest and 05/01/2015 CT chest. FINDINGS: Normal heart size and pulmonary vascularity. Emphysematous changes in the lungs with diffuse interstitial fibrosis. Scarring and cavitary changes seen previously in the left upper lung are decreased in prominence on today's chest radiograph. No developing consolidation or edema. No blunting of costophrenic angles. No pneumothorax. Mild degenerative changes in the spine. IMPRESSION:  Emphysematous changes and fibrosis throughout the lungs. Scarring and cavitary changes previously seen in the left upper lung are less prominent on today's study, possibly due to differences in technique. No developing consolidation or edema. Electronically Signed   By: Lucienne Capers M.D.   On: 07/01/2015 00:39   I have personally reviewed and evaluated these images and lab results as part of my medical decision-making.   EKG Interpretation   Date/Time:  Saturday June 30 2015 23:36:58 EST Ventricular Rate:  84 PR Interval:  117 QRS Duration: 85 QT Interval:  367 QTC Calculation: 434 R Axis:   70 Text Interpretation:  Sinus rhythm Borderline short PR interval Confirmed  by Sohail Capraro  MD, Waylyn Tenbrink ([id number]) on 07/01/2015 12:03:15 AM      MDM   Final diagnoses:  COPD exacerbation ([place])    Patient presents with shortness of breath, cough, and chest pain. Nontoxic on exam. Afebrile. Satting 94% on 2 L nasal cannula. Evidence of decreased air movement and wheezing on exam. EKG is nonischemic and troponin is negative. Suspect COPD exacerbation. Patient has an albuterol allergy listed. When asked, patient states that he went into atrial fibrillation with RVR after continuous neb. Will place on short duo neb and give Solu-Medrol. Patient tolerated duo neb and reports some improvement of his shortness of breath. Continues to have some chest pain. He was given Norco and Toradol. Chest x-ray shows no evidence of pneumonia. On recheck, patient continues to appear comfortable. He received a second duo neb. Suspect symptoms are related to COPD exacerbation. Will discharge on prednisone and have him follow-up closely with his primary physician.  After history, exam, and medical workup I feel the patient has been appropriately medically screened and is safe for discharge home. Pertinent diagnoses were discussed with the patient. Patient was given return precautions.    Merryl Hacker, MD 07/01/15  [phone]

## 2015-07-01 NOTE — Discharge Instructions (Signed)
Chronic Obstructive Pulmonary Disease Chronic obstructive pulmonary disease (COPD) is a common lung condition in which airflow from the lungs is limited. COPD is a general term that can be used to describe many different lung problems that limit airflow, including both chronic bronchitis and emphysema. If you have COPD, your lung function will probably never return to normal, but there are measures you can take to improve lung function and make yourself feel better. CAUSES   Smoking (common).  Exposure to secondhand smoke.  Genetic problems.  Chronic inflammatory lung diseases or recurrent infections. SYMPTOMS  Shortness of breath, especially with physical activity.  Deep, persistent (chronic) cough with a large amount of thick mucus.  Wheezing.  Rapid breaths (tachypnea).  Gray or bluish discoloration (cyanosis) of the skin, especially in your fingers, toes, or lips.  Fatigue.  Weight loss.  Frequent infections or episodes when breathing symptoms become much worse (exacerbations).  Chest tightness. DIAGNOSIS Your health care provider will take a medical history and perform a physical examination to diagnose COPD. Additional tests for COPD may include:  Lung (pulmonary) function tests.  Chest X-ray.  CT scan.  Blood tests. TREATMENT  Treatment for COPD may include:  Inhaler and nebulizer medicines. These help manage the symptoms of COPD and make your breathing more comfortable.  Supplemental oxygen. Supplemental oxygen is only helpful if you have a low oxygen level in your blood.  Exercise and physical activity. These are beneficial for nearly all people with COPD.  Lung surgery or transplant.  Nutrition therapy to gain weight, if you are underweight.  Pulmonary rehabilitation. This may involve working with a team of health care providers and specialists, such as respiratory, occupational, and physical therapists. HOME CARE INSTRUCTIONS  Take all medicines  (inhaled or pills) as directed by your health care provider.  Avoid over-the-counter medicines or cough syrups that dry up your airway (such as antihistamines) and slow down the elimination of secretions unless instructed otherwise by your health care provider.  If you are a smoker, the most important thing that you can do is stop smoking. Continuing to smoke will cause further lung damage and breathing trouble. Ask your health care provider for help with quitting smoking. He or she can direct you to community resources or hospitals that provide support.  Avoid exposure to irritants such as smoke, chemicals, and fumes that aggravate your breathing.  Use oxygen therapy and pulmonary rehabilitation if directed by your health care provider. If you require home oxygen therapy, ask your health care provider whether you should purchase a pulse oximeter to measure your oxygen level at home.  Avoid contact with individuals who have a contagious illness.  Avoid extreme temperature and humidity changes.  Eat healthy foods. Eating smaller, more frequent meals and resting before meals may help you maintain your strength.  Stay active, but balance activity with periods of rest. Exercise and physical activity will help you maintain your ability to do things you want to do.  Preventing infection and hospitalization is very important when you have COPD. Make sure to receive all the vaccines your health care provider recommends, especially the pneumococcal and influenza vaccines. Ask your health care provider whether you need a pneumonia vaccine.  Learn and use relaxation techniques to manage stress.  Learn and use controlled breathing techniques as directed by your health care provider. Controlled breathing techniques include:  Pursed lip breathing. Start by breathing in (inhaling) through your nose for 1 second. Then, purse your lips as if you were   going to whistle and breathe out (exhale) through the  pursed lips for 2 seconds.  Diaphragmatic breathing. Start by putting one hand on your abdomen just above your waist. Inhale slowly through your nose. The hand on your abdomen should move out. Then purse your lips and exhale slowly. You should be able to feel the hand on your abdomen moving in as you exhale.  Learn and use controlled coughing to clear mucus from your lungs. Controlled coughing is a series of short, progressive coughs. The steps of controlled coughing are: 1. Lean your head slightly forward. 2. Breathe in deeply using diaphragmatic breathing. 3. Try to hold your breath for 3 seconds. 4. Keep your mouth slightly open while coughing twice. 5. Spit any mucus out into a tissue. 6. Rest and repeat the steps once or twice as needed. SEEK MEDICAL CARE IF:  You are coughing up more mucus than usual.  There is a change in the color or thickness of your mucus.  Your breathing is more labored than usual.  Your breathing is faster than usual. SEEK IMMEDIATE MEDICAL CARE IF:  You have shortness of breath while you are resting.  You have shortness of breath that prevents you from:  Being able to talk.  Performing your usual physical activities.  You have chest pain lasting longer than 5 minutes.  Your skin color is more cyanotic than usual.  You measure low oxygen saturations for longer than 5 minutes with a pulse oximeter. MAKE SURE YOU:  Understand these instructions.  Will watch your condition.  Will get help right away if you are not doing well or get worse.   This information is not intended to replace advice given to you by your health care provider. Make sure you discuss any questions you have with your health care provider.   Document Released: 04/09/2005 Document Revised: 07/21/2014 Document Reviewed: 02/24/2013 Elsevier Interactive Patient Education 2016 [employer]  

## 2015-07-15 ENCOUNTER — Encounter (HOSPITAL_COMMUNITY): Payer: Self-pay | Admitting: Emergency Medicine

## 2015-07-15 ENCOUNTER — Emergency Department (HOSPITAL_COMMUNITY): Payer: Medicare Other

## 2015-07-15 ENCOUNTER — Inpatient Hospital Stay (HOSPITAL_COMMUNITY)
Admission: EM | Admit: 2015-07-15 | Discharge: 2015-08-01 | DRG: 208 | Disposition: A | Discharge status: Home Health | Payer: Medicare Other | Attending: Pulmonary Disease | Admitting: Pulmonary Disease

## 2015-07-15 DIAGNOSIS — J440 Chronic obstructive pulmonary disease with acute lower respiratory infection: Secondary | ICD-10-CM | POA: Diagnosis present

## 2015-07-15 DIAGNOSIS — Z7952 Long term (current) use of systemic steroids: Secondary | ICD-10-CM | POA: Diagnosis not present

## 2015-07-15 DIAGNOSIS — J9690 Respiratory failure, unspecified, unspecified whether with hypoxia or hypercapnia: Secondary | ICD-10-CM

## 2015-07-15 DIAGNOSIS — R6520 Severe sepsis without septic shock: Secondary | ICD-10-CM | POA: Diagnosis not present

## 2015-07-15 DIAGNOSIS — E785 Hyperlipidemia, unspecified: Secondary | ICD-10-CM | POA: Diagnosis present

## 2015-07-15 DIAGNOSIS — J9621 Acute and chronic respiratory failure with hypoxia: Secondary | ICD-10-CM | POA: Diagnosis present

## 2015-07-15 DIAGNOSIS — I482 Chronic atrial fibrillation: Secondary | ICD-10-CM | POA: Diagnosis present

## 2015-07-15 DIAGNOSIS — I2510 Atherosclerotic heart disease of native coronary artery without angina pectoris: Secondary | ICD-10-CM | POA: Diagnosis present

## 2015-07-15 DIAGNOSIS — E46 Unspecified protein-calorie malnutrition: Secondary | ICD-10-CM | POA: Diagnosis present

## 2015-07-15 DIAGNOSIS — A4102 Sepsis due to Methicillin resistant Staphylococcus aureus: Secondary | ICD-10-CM

## 2015-07-15 DIAGNOSIS — E7800 Pure hypercholesterolemia, unspecified: Secondary | ICD-10-CM | POA: Diagnosis present

## 2015-07-15 DIAGNOSIS — R0602 Shortness of breath: Secondary | ICD-10-CM | POA: Diagnosis present

## 2015-07-15 DIAGNOSIS — Z79891 Long term (current) use of opiate analgesic: Secondary | ICD-10-CM

## 2015-07-15 DIAGNOSIS — E1165 Type 2 diabetes mellitus with hyperglycemia: Secondary | ICD-10-CM | POA: Diagnosis present

## 2015-07-15 DIAGNOSIS — Z833 Family history of diabetes mellitus: Secondary | ICD-10-CM | POA: Diagnosis not present

## 2015-07-15 DIAGNOSIS — Z6821 Body mass index (BMI) 21.0-21.9, adult: Secondary | ICD-10-CM | POA: Diagnosis not present

## 2015-07-15 DIAGNOSIS — R911 Solitary pulmonary nodule: Secondary | ICD-10-CM | POA: Diagnosis present

## 2015-07-15 DIAGNOSIS — Z87891 Personal history of nicotine dependence: Secondary | ICD-10-CM

## 2015-07-15 DIAGNOSIS — G8929 Other chronic pain: Secondary | ICD-10-CM | POA: Diagnosis present

## 2015-07-15 DIAGNOSIS — F419 Anxiety disorder, unspecified: Secondary | ICD-10-CM | POA: Diagnosis present

## 2015-07-15 DIAGNOSIS — J690 Pneumonitis due to inhalation of food and vomit: Secondary | ICD-10-CM | POA: Diagnosis not present

## 2015-07-15 DIAGNOSIS — J984 Other disorders of lung: Secondary | ICD-10-CM | POA: Diagnosis not present

## 2015-07-15 DIAGNOSIS — J441 Chronic obstructive pulmonary disease with (acute) exacerbation: Principal | ICD-10-CM

## 2015-07-15 DIAGNOSIS — Y95 Nosocomial condition: Secondary | ICD-10-CM | POA: Diagnosis not present

## 2015-07-15 DIAGNOSIS — A419 Sepsis, unspecified organism: Secondary | ICD-10-CM | POA: Diagnosis not present

## 2015-07-15 DIAGNOSIS — N179 Acute kidney failure, unspecified: Secondary | ICD-10-CM | POA: Diagnosis not present

## 2015-07-15 DIAGNOSIS — Z8249 Family history of ischemic heart disease and other diseases of the circulatory system: Secondary | ICD-10-CM | POA: Diagnosis not present

## 2015-07-15 DIAGNOSIS — E876 Hypokalemia: Secondary | ICD-10-CM | POA: Diagnosis not present

## 2015-07-15 DIAGNOSIS — M62838 Other muscle spasm: Secondary | ICD-10-CM | POA: Diagnosis present

## 2015-07-15 DIAGNOSIS — Z720 Tobacco use: Secondary | ICD-10-CM

## 2015-07-15 DIAGNOSIS — J9620 Acute and chronic respiratory failure, unspecified whether with hypoxia or hypercapnia: Secondary | ICD-10-CM | POA: Diagnosis present

## 2015-07-15 DIAGNOSIS — Z7901 Long term (current) use of anticoagulants: Secondary | ICD-10-CM | POA: Diagnosis not present

## 2015-07-15 DIAGNOSIS — Z7984 Long term (current) use of oral hypoglycemic drugs: Secondary | ICD-10-CM | POA: Diagnosis not present

## 2015-07-15 DIAGNOSIS — K219 Gastro-esophageal reflux disease without esophagitis: Secondary | ICD-10-CM | POA: Diagnosis present

## 2015-07-15 DIAGNOSIS — Z8701 Personal history of pneumonia (recurrent): Secondary | ICD-10-CM

## 2015-07-15 DIAGNOSIS — J181 Lobar pneumonia, unspecified organism: Secondary | ICD-10-CM | POA: Diagnosis not present

## 2015-07-15 DIAGNOSIS — R7881 Bacteremia: Secondary | ICD-10-CM | POA: Diagnosis not present

## 2015-07-15 DIAGNOSIS — R0689 Other abnormalities of breathing: Secondary | ICD-10-CM

## 2015-07-15 DIAGNOSIS — J189 Pneumonia, unspecified organism: Secondary | ICD-10-CM | POA: Diagnosis not present

## 2015-07-15 DIAGNOSIS — E119 Type 2 diabetes mellitus without complications: Secondary | ICD-10-CM

## 2015-07-15 DIAGNOSIS — R0600 Dyspnea, unspecified: Secondary | ICD-10-CM | POA: Diagnosis not present

## 2015-07-15 LAB — CBC WITH DIFFERENTIAL/PLATELET
BASOS ABS: 0 10*3/uL (ref 0.0–0.1)
BASOS PCT: 0 %
EOS ABS: 0.1 10*3/uL (ref 0.0–0.7)
EOS PCT: 1 %
HCT: 33 % — ABNORMAL LOW (ref 39.0–52.0)
HEMOGLOBIN: 10.3 g/dL — AB (ref 13.0–17.0)
LYMPHS PCT: 18 %
Lymphs Abs: 1.3 10*3/uL (ref 0.7–4.0)
MCH: 23.9 pg — ABNORMAL LOW (ref 26.0–34.0)
MCHC: 31.2 g/dL (ref 30.0–36.0)
MCV: 76.6 fL — ABNORMAL LOW (ref 78.0–100.0)
Monocytes Absolute: 0.8 10*3/uL (ref 0.1–1.0)
Monocytes Relative: 12 %
Neutro Abs: 4.8 10*3/uL (ref 1.7–7.7)
Neutrophils Relative %: 69 %
PLATELETS: 196 10*3/uL (ref 150–400)
RBC: 4.31 MIL/uL (ref 4.22–5.81)
RDW: 16.6 % — ABNORMAL HIGH (ref 11.5–15.5)
WBC: 7 10*3/uL (ref 4.0–10.5)

## 2015-07-15 LAB — GLUCOSE, CAPILLARY: Glucose-Capillary: 397 mg/dL — ABNORMAL HIGH (ref 65–99)

## 2015-07-15 LAB — BLOOD GAS, ARTERIAL
ACID-BASE EXCESS: 0.6 mmol/L (ref 0.0–2.0)
Bicarbonate: 25.1 mEq/L — ABNORMAL HIGH (ref 20.0–24.0)
DRAWN BY: 25788
O2 CONTENT: 4 L/min
O2 SAT: 94.6 %
pCO2 arterial: 36.5 mmHg (ref 35.0–45.0)
pH, Arterial: 7.438 (ref 7.350–7.450)
pO2, Arterial: 76.3 mmHg — ABNORMAL LOW (ref 80.0–100.0)

## 2015-07-15 LAB — COMPREHENSIVE METABOLIC PANEL
ALBUMIN: 3.1 g/dL — AB (ref 3.5–5.0)
ALK PHOS: 64 U/L (ref 38–126)
ALT: 20 U/L (ref 17–63)
AST: 20 U/L (ref 15–41)
Anion gap: 8 (ref 5–15)
BUN: 13 mg/dL (ref 6–20)
CALCIUM: 8.2 mg/dL — AB (ref 8.9–10.3)
CHLORIDE: 102 mmol/L (ref 101–111)
CO2: 26 mmol/L (ref 22–32)
CREATININE: 0.96 mg/dL (ref 0.61–1.24)
GFR calc Af Amer: >60 mL/min (ref >60)
GFR calc non Af Amer: >60 mL/min (ref >60)
Glucose, Bld: 216 mg/dL — ABNORMAL HIGH (ref 65–99)
Potassium: 3.8 mmol/L (ref 3.5–5.1)
SODIUM: 136 mmol/L (ref 135–145)
Total Bilirubin: 0.4 mg/dL (ref 0.3–1.2)
Total Protein: 6.5 g/dL (ref 6.5–8.1)

## 2015-07-15 LAB — I-STAT TROPONIN, ED: TROPONIN I, POC: 0.01 ng/mL (ref 0.00–0.08)

## 2015-07-15 LAB — BRAIN NATRIURETIC PEPTIDE: B NATRIURETIC PEPTIDE 5: 150 pg/mL — AB (ref 0.0–100.0)

## 2015-07-15 MED ORDER — LEVALBUTEROL HCL 0.63 MG/3ML IN NEBU
0.6300 mg | INHALATION_SOLUTION | Freq: Four times a day (QID) | RESPIRATORY_TRACT | Status: DC
  Administered 2015-07-16 – 2015-07-17 (×6): 0.63 mg via RESPIRATORY_TRACT
  Filled 2015-07-15 (×6): qty 3

## 2015-07-15 MED ORDER — SODIUM CHLORIDE 0.9 % IJ SOLN
3.0000 mL | Freq: Two times a day (BID) | INTRAMUSCULAR | Status: DC
  Administered 2015-07-17 – 2015-07-29 (×19): 3 mL via INTRAVENOUS

## 2015-07-15 MED ORDER — SODIUM CHLORIDE 0.9 % IJ SOLN: 3.0000 mL | INTRAMUSCULAR | Status: DC | PRN

## 2015-07-15 MED ORDER — ALBUTEROL (5 MG/ML) CONTINUOUS INHALATION SOLN
10.0000 mg/h | INHALATION_SOLUTION | Freq: Once | RESPIRATORY_TRACT | Status: AC
  Administered 2015-07-15: 10 mg/h via RESPIRATORY_TRACT
  Filled 2015-07-15: qty 20

## 2015-07-15 MED ORDER — SODIUM CHLORIDE 0.9 % IV SOLN: 250.0000 mL | INTRAVENOUS | Status: DC | PRN

## 2015-07-15 MED ORDER — AZITHROMYCIN 250 MG PO TABS
250.0000 mg | ORAL_TABLET | Freq: Every day | ORAL | Status: AC
  Administered 2015-07-17 – 2015-07-20 (×4): 250 mg via ORAL
  Filled 2015-07-15 (×4): qty 1

## 2015-07-15 MED ORDER — DM-GUAIFENESIN ER 30-600 MG PO TB12: 1.0000 | ORAL_TABLET | Freq: Two times a day (BID) | ORAL | Status: DC | PRN

## 2015-07-15 MED ORDER — BENZONATATE 100 MG PO CAPS
100.0000 mg | ORAL_CAPSULE | Freq: Three times a day (TID) | ORAL | Status: DC | PRN
  Administered 2015-07-15 – 2015-07-17 (×3): 100 mg via ORAL
  Filled 2015-07-15 (×3): qty 1

## 2015-07-15 MED ORDER — IPRATROPIUM BROMIDE 0.02 % IN SOLN
0.5000 mg | Freq: Four times a day (QID) | RESPIRATORY_TRACT | Status: DC
Start: 2015-07-15 — End: 2015-07-25
  Administered 2015-07-16 – 2015-07-25 (×37): 0.5 mg via RESPIRATORY_TRACT
  Filled 2015-07-15 (×37): qty 2.5

## 2015-07-15 MED ORDER — LORAZEPAM 0.5 MG PO TABS
0.5000 mg | ORAL_TABLET | Freq: Two times a day (BID) | ORAL | Status: DC | PRN
  Administered 2015-07-15: 0.5 mg via ORAL
  Filled 2015-07-15: qty 1

## 2015-07-15 MED ORDER — AZITHROMYCIN 250 MG PO TABS
500.0000 mg | ORAL_TABLET | Freq: Every day | ORAL | Status: AC
  Administered 2015-07-16: 500 mg via ORAL
  Filled 2015-07-15: qty 2

## 2015-07-15 MED ORDER — FLUTICASONE PROPIONATE 50 MCG/ACT NA SUSP
2.0000 | Freq: Every day | NASAL | Status: DC
  Administered 2015-07-16 – 2015-07-24 (×9): 2 via NASAL
  Filled 2015-07-15 (×2): qty 16

## 2015-07-15 MED ORDER — INSULIN ASPART 100 UNIT/ML ~~LOC~~ SOLN
5.0000 [IU] | Freq: Once | SUBCUTANEOUS | Status: AC
  Administered 2015-07-15: 5 [IU] via SUBCUTANEOUS

## 2015-07-15 MED ORDER — ONDANSETRON HCL 4 MG/2ML IJ SOLN: 4.0000 mg | Freq: Four times a day (QID) | INTRAMUSCULAR | Status: DC | PRN

## 2015-07-15 MED ORDER — SODIUM CHLORIDE 0.9 % IJ SOLN
3.0000 mL | Freq: Two times a day (BID) | INTRAMUSCULAR | Status: DC
  Administered 2015-07-15 – 2015-07-31 (×22): 3 mL via INTRAVENOUS

## 2015-07-15 MED ORDER — METHYLPREDNISOLONE SODIUM SUCC 125 MG IJ SOLR
125.0000 mg | Freq: Once | INTRAMUSCULAR | Status: AC
  Administered 2015-07-15: 125 mg via INTRAVENOUS
  Filled 2015-07-15: qty 2

## 2015-07-15 MED ORDER — IPRATROPIUM-ALBUTEROL 0.5-2.5 (3) MG/3ML IN SOLN
3.0000 mL | Freq: Once | RESPIRATORY_TRACT | Status: AC
  Administered 2015-07-15: 3 mL via RESPIRATORY_TRACT
  Filled 2015-07-15: qty 3

## 2015-07-15 MED ORDER — METHYLPREDNISOLONE SODIUM SUCC 125 MG IJ SOLR
80.0000 mg | Freq: Two times a day (BID) | INTRAMUSCULAR | Status: DC
  Administered 2015-07-16: 80 mg via INTRAVENOUS
  Filled 2015-07-15 (×2): qty 2

## 2015-07-15 MED ORDER — INSULIN ASPART 100 UNIT/ML ~~LOC~~ SOLN
0.0000 [IU] | Freq: Three times a day (TID) | SUBCUTANEOUS | Status: DC
  Administered 2015-07-16: 2 [IU] via SUBCUTANEOUS
  Administered 2015-07-16: 3 [IU] via SUBCUTANEOUS
  Administered 2015-07-16: 5 [IU] via SUBCUTANEOUS
  Administered 2015-07-17 (×3): 3 [IU] via SUBCUTANEOUS

## 2015-07-15 MED ORDER — LEVALBUTEROL HCL 0.63 MG/3ML IN NEBU
0.6300 mg | INHALATION_SOLUTION | Freq: Four times a day (QID) | RESPIRATORY_TRACT | Status: DC | PRN
  Administered 2015-07-17 – 2015-07-24 (×3): 0.63 mg via RESPIRATORY_TRACT
  Filled 2015-07-15 (×4): qty 3

## 2015-07-15 MED ORDER — EDOXABAN TOSYLATE 60 MG PO TABS
60.0000 mg | ORAL_TABLET | Freq: Every day | ORAL | Status: DC
  Administered 2015-07-16 – 2015-08-01 (×17): 60 mg via ORAL
  Filled 2015-07-15 (×2): qty 60
  Filled 2015-07-15 (×7): qty 2
  Filled 2015-07-15 (×2): qty 60
  Filled 2015-07-15 (×10): qty 2

## 2015-07-15 MED ORDER — OXYCODONE-ACETAMINOPHEN 5-325 MG PO TABS
1.0000 | ORAL_TABLET | ORAL | Status: DC | PRN
  Administered 2015-07-15 – 2015-07-16 (×2): 1 via ORAL
  Filled 2015-07-15 (×4): qty 1

## 2015-07-15 MED ORDER — DILTIAZEM HCL ER COATED BEADS 120 MG PO CP24
120.0000 mg | ORAL_CAPSULE | Freq: Every day | ORAL | Status: DC
  Administered 2015-07-16 – 2015-07-24 (×9): 120 mg via ORAL
  Filled 2015-07-15 (×9): qty 1

## 2015-07-15 MED ORDER — NITROGLYCERIN 0.4 MG SL SUBL: 0.4000 mg | SUBLINGUAL_TABLET | SUBLINGUAL | Status: DC | PRN

## 2015-07-15 MED ORDER — PANTOPRAZOLE SODIUM 40 MG PO TBEC
40.0000 mg | DELAYED_RELEASE_TABLET | Freq: Two times a day (BID) | ORAL | Status: DC
  Administered 2015-07-15 – 2015-07-24 (×19): 40 mg via ORAL
  Filled 2015-07-15 (×19): qty 1

## 2015-07-15 MED ORDER — ONDANSETRON HCL 4 MG PO TABS: 4.0000 mg | ORAL_TABLET | Freq: Four times a day (QID) | ORAL | Status: DC | PRN

## 2015-07-15 MED ORDER — MONTELUKAST SODIUM 10 MG PO TABS
10.0000 mg | ORAL_TABLET | Freq: Every day | ORAL | Status: DC
  Administered 2015-07-15 – 2015-07-24 (×10): 10 mg via ORAL
  Filled 2015-07-15 (×10): qty 1

## 2015-07-15 MED ORDER — DEXTROMETHORPHAN-GUAIFENESIN 20-400 MG PO TABS: 1.0000 | ORAL_TABLET | Freq: Four times a day (QID) | ORAL | Status: DC | PRN

## 2015-07-15 NOTE — ED Provider Notes (Signed)
CSN: [id number]     Arrival date & time 07/15/15  1622 History   First MD Initiated Contact with Patient 07/15/15 1720     Chief Complaint  Patient presents with  . Shortness of Breath     (Consider location/radiation/quality/duration/timing/severity/associated sxs/prior Treatment) HPI   Ryan [name] is a 55 y.o. male who presents for evaluation of shortness of breath and sleepiness. He also has cough productive of sputum. He has been ill for several days. He is using his usual medications, without relief. His wife states that he is sleepy and confused, which is unusual. He has not used his albuterol inhaler since yesterday. He was hospitalized in October 2016. At that time he was noted to have a pulmonary nodule which was actually residual from a cavitary lesion in 2012. 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 There has been no fever, chills, vomiting, focal weakness or paresthesia. He is taking his usual medications, without relief. There are no other known modifying factors.   Past Medical History  Diagnosis Date  . COPD (chronic obstructive pulmonary disease) ([place])   . Hypercholesteremia   . History of pneumonia   . Polycythemia   . Coronary atherosclerosis of native coronary artery     Mild nonobstructive disease at catheterization 2007  . 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 Borderline diabetes   . Seizures ([place])     Last seizure 2 yrs ago  . GERD (gastroesophageal reflux disease)   . Type 2 diabetes mellitus ([place])   . DDD (degenerative disc disease)     Cervical and thoracic  . Chronic back pain   . Bronchitis   . Chronic left shoulder pain   . Chronic neck pain   . Atrial fibrillation ([place])     Not anticoagulated  . Smoker   . Type 2 diabetes mellitus without complication ([place])   . CAP (community acquired  pneumonia) 07/10/2013    04/2015  . Collagen vascular disease ([place])   . Chronic respiratory failure [facility])    Past Surgical History  Procedure Laterality Date  . Colonoscopy w/ endoscopic [place]    . Vasectomy    . Throat biopsy    . Lung biopsy    . Vasectomy  1987  . Esophagogastroduodenoscopy N/A 05/04/2015    Procedure: ESOPHAGOGASTRODUODENOSCOPY (EGD);  Surgeon: Wilford Corner, MD;  Location: [facility] ENDOSCOPY;  Service: Endoscopy;  Laterality: N/A;   Family History  Problem Relation Age of Onset  . Hypertension Mother   . Diabetes Mother   . Heart attack Mother   . Heart failure Mother   . Hypertension Sister   . Diabetes Sister   . Heart failure Sister    Social History  Substance Use Topics  . Smoking status: Former Smoker -- 1.00 packs/day for 45 years    Types: Cigarettes    Quit date: 05/01/2015  . Smokeless tobacco: Never Used  . Alcohol Use: No    Review of Systems  All other systems reviewed and are negative.     Allergies  Albuterol and Influenza vaccine live  Home Medications   Prior to Admission medications   Medication Sig Start Date End Date Taking? Authorizing Provider  albuterol (VENTOLIN HFA) 108 (90 BASE) MCG/ACT inhaler Inhale 2 puffs into the lungs every 6 (six) hours as needed for wheezing or  shortness of breath.   Yes Historical Provider, MD  benzonatate (TESSALON) 100 MG capsule Take 1 capsule (100 mg total) by mouth 3 (three) times daily as needed for cough. 05/04/15  Yes Maryann Mikhail, DO  budesonide-formoterol (SYMBICORT) 160-4.5 MCG/ACT inhaler Inhale 2 puffs into the lungs 2 (two) times daily.   Yes Historical Provider, MD  Dextromethorphan-Guaifenesin 20-400 MG TABS Take 1 tablet by mouth every 6 (six) hours as needed (mucus).   Yes Historical Provider, MD  diltiazem (CARDIZEM CD) 120 MG 24 hr capsule Take 1 capsule (120 mg total) by mouth daily. 05/11/15  Yes Lendon Colonel, NP  fluticasone (FLONASE) 50 MCG/ACT nasal spray Place 2  sprays into both nostrils daily.   Yes Historical Provider, MD  LORazepam (ATIVAN) 0.5 MG tablet Take 1 tablet (0.5 mg total) by mouth 2 (two) times daily as needed for anxiety or sleep. 05/04/15  Yes Maryann Mikhail, DO  metFORMIN (GLUCOPHAGE) 500 MG tablet Take 1 tablet (500 mg total) by mouth daily with breakfast. 09/15/14  Yes Rexene Alberts, MD  montelukast (SINGULAIR) 10 MG tablet Take 10 mg by mouth at bedtime.   Yes Historical Provider, MD  oxyCODONE-acetaminophen (PERCOCET) 10-325 MG tablet Take 1 tablet by mouth every 4 (four) hours as needed for pain.   Yes Historical Provider, MD  oxyCODONE-acetaminophen (ROXICET) 5-325 MG per tablet Take 1 tablet by mouth every 4 (four) hours as needed for severe pain. 09/15/14  Yes Rexene Alberts, MD  pantoprazole (PROTONIX) 40 MG tablet Take 1 tablet (40 mg total) by mouth 2 (two) times daily. 05/05/15  Yes Maryann Mikhail, DO  promethazine-dextromethorphan (PROMETHAZINE-DM) 6.25-15 MG/5ML syrup Take 5 mLs by mouth 4 (four) times daily as needed for cough.   Yes Historical Provider, MD  SPIRIVA RESPIMAT 2.5 MCG/ACT AERS Inhale 1 puff into the lungs daily. 06/20/15  Yes Historical Provider, MD  baclofen (LIORESAL) 10 MG tablet Take 10 mg by mouth 3 (three) times daily as needed. 06/05/15   Historical Provider, MD  edoxaban (SAVAYSA) 60 MG TABS tablet Take 60 mg by mouth daily. 05/11/15   Lendon Colonel, NP  levalbuterol Penne Lash) 0.63 MG/3ML nebulizer solution Take 3 mLs (0.63 mg total) by nebulization 3 (three) times daily. Patient taking differently: Take 0.63 mg by nebulization every 8 (eight) hours as needed for wheezing or shortness of breath.  09/15/14   Rexene Alberts, MD  nicotine (NICODERM CQ - DOSED IN MG/24 HOURS) 21 mg/24hr patch Place 1 patch (21 mg total) onto the skin daily. 05/05/15   Maryann Mikhail, DO  nitroGLYCERIN (NITROSTAT) 0.4 MG SL tablet Place 1 tablet (0.4 mg total) under the tongue every 5 (five) minutes as needed for chest pain.  04/22/13   Lendon Colonel, NP  predniSONE (DELTASONE) 20 MG tablet Take 2 tablets (40 mg total) by mouth daily. 07/01/15   Merryl Hacker, MD   BP 116/66 mmHg  Pulse 93  Temp(Src) 98.6 F (37 C) (Oral)  Resp 28  Ht '5\' 8"'$  (1.727 m)  Wt 144 lb (65.318 kg)  BMI 21.90 kg/m2  SpO2 98% Physical Exam  Constitutional: He is oriented to person, place, and time. He appears well-developed.  Appears older than stated age.  HENT:  Head: Normocephalic and atraumatic.  Right Ear: External ear normal.  Left Ear: External ear normal.  Eyes: Conjunctivae and EOM are normal. Pupils are equal, round, and reactive to light.  Neck: Normal range of motion and phonation normal. Neck supple.  Cardiovascular: Normal rate, regular  rhythm and normal heart sounds.   Pulmonary/Chest: Effort normal. He exhibits no bony tenderness.  Decreased air movement bilaterally with generalized wheezing. Mildly increased work of breathing.  Abdominal: Soft. There is no tenderness.  Musculoskeletal: Normal range of motion. He exhibits edema (1+ bilateral lower extremity).  Neurological: He is alert and oriented to person, place, and time. No cranial nerve deficit or sensory deficit. He exhibits normal muscle tone. Coordination normal.  Skin: Skin is warm, dry and intact.  Psychiatric: He has a normal mood and affect. His behavior is normal. Judgment and thought content normal.  Nursing note and vitals reviewed.   ED Course  Procedures (including critical care time)  Medications  albuterol (PROVENTIL,VENTOLIN) solution continuous neb (10 mg/hr Nebulization Given 07/15/15 1743)  methylPREDNISolone sodium succinate (SOLU-MEDROL) 125 mg/2 mL injection 125 mg (125 mg Intravenous Given 07/15/15 1759)    Patient Vitals for the past 24 hrs:  BP Temp Temp src Pulse Resp SpO2 Height Weight  07/15/15 1800 116/66 mmHg - - 93 (!) 28 98 % - -  07/15/15 1745 - - - 85 20 98 % - -  07/15/15 1741 - - - - - 97 % - -  07/15/15 1700  120/79 mmHg - - 87 (!) 27 94 % - -  07/15/15 1638 104/83 mmHg 98.6 F (37 C) Oral 95 18 98 % '5\' 8"'$  (1.727 m) 144 lb (65.318 kg)    7:33 PM Reevaluation with update and discussion. After initial assessment and treatment, an updated evaluation reveals he has just now finished his continuous nebulizer. Heart rate is 120/m. It appears to be sinus. Lungs with persistent poor air movement and generalized wheezing. His wife states that he still appears confused. Ryan [name]   7:36 PM-Consult complete with Dr. Shanon Brow. Patient case explained and discussed. She agrees to admit patient for further evaluation and treatment. Call ended at 20:25  CRITICAL CARE Performed by: Richarda Blade Total critical care time: 40 minutes Critical care time was exclusive of separately billable procedures and treating other patients. Critical care was necessary to treat or prevent imminent or life-threatening deterioration. Critical care was time spent personally by me on the following activities: development of treatment plan with patient and/or surrogate as well as nursing, discussions with consultants, evaluation of patient's response to treatment, examination of patient, obtaining history from patient or surrogate, ordering and performing treatments and interventions, ordering and review of laboratory studies, ordering and review of radiographic studies, pulse oximetry and re-evaluation of patient's condition.  Labs Review Labs Reviewed  CBC WITH DIFFERENTIAL/PLATELET - Abnormal; Notable for the following:    Hemoglobin 10.3 (*)    HCT 33.0 (*)    MCV 76.6 (*)    MCH 23.9 (*)    RDW 16.6 (*)    All other components within normal limits  COMPREHENSIVE METABOLIC PANEL - Abnormal; Notable for the following:    Glucose, Bld 216 (*)    Calcium 8.2 (*)    Albumin 3.1 (*)    All other components within normal limits  BRAIN NATRIURETIC PEPTIDE - Abnormal; Notable for the following:    B Natriuretic Peptide 150.0  (*)    All other components within normal limits  BLOOD GAS, ARTERIAL - Abnormal; Notable for the following:    pO2, Arterial 76.3 (*)    Bicarbonate 25.1 (*)    All other components within normal limits    Imaging Review Dg Chest 2 View  07/15/2015  CLINICAL DATA:  SOB, WEAK, Pt on  O2 at night, pt has increased sob, cough with thick brown, clear sputum, PATIENT STATES " HE IS ALSO HAVING REALLY BAD LEG SPASMS LATELY" HISTORY OF DIABETES, PNEUMONIA, ATRIAL FIB, SEIZURES, SMOKER, CAD, CHRONIC RESPIRATORY FAILURE EXAM: CHEST  2 VIEW COMPARISON:  Chest radiograph 07/01/2015 FINDINGS: Multiple monitoring leads overlie the patient. Stable cardiac and mediastinal contours. Re- demonstrated coarse bilateral interstitial pulmonary opacities involving the mid and lower lungs. Apical emphysematous change. Re- demonstrated peripheral consolidative opacity within the left upper lung. No pleural effusion pneumothorax. IMPRESSION: Findings most compatible with interstitial lung disease within the bilateral mid and lower lungs. Re- demonstrated cavitary lesion within the left upper lobe. Emphysema. Electronically Signed   By: Lovey Newcomer M.D.   On: 07/15/2015 17:51   I have personally reviewed and evaluated these images and lab results as part of my medical decision-making.   EKG Interpretation   Date/Time:  Sunday July 15 2015 16:49:37 EST Ventricular Rate:  85 PR Interval:  121 QRS Duration: 85 QT Interval:  364 QTC Calculation: 433 R Axis:   64 Text Interpretation:  Sinus rhythm RSR' in V1 or V2, probably normal  variant ST elevation, consider inferior injury since last tracing no  significant change Confirmed by [facility]  MD, Kamaile Zachow [phone]) on 07/15/2015  5:24:46 PM      MDM   Final diagnoses:  COPD exacerbation ([place])  Tobacco abuse    Evaluation is consistent with COPD exacerbation. No hypercapnia. He appears to be in mild respiratory distress. There is no indication for intubation or BiPAP  at this time. He will need admission for observation and repeated nebulizer treatment, in an observe setting.  Nursing Notes Reviewed/ Care Coordinated Applicable Imaging Reviewed Interpretation of Laboratory Data incorporated into ED treatment   Plan: Admit    Daleen Bo, MD 07/20/15 1108

## 2015-07-15 NOTE — ED Notes (Signed)
Per family pt is fall asleep more, weak

## 2015-07-15 NOTE — ED Notes (Signed)
Pt on O2 at night, pt has increased sob, cough with thick brown, clear sputum

## 2015-07-15 NOTE — H&P (Signed)
PCP:   Alonza Bogus, MD   Chief Complaint:  Sob, wheezing  HPI: 55 yo male with h/o copd 2-3 liters [place] at home, dm, afib resumed smoking about 3 months ago comes in with 3 days of worsening sob and wheezing.  No fevers.  No n/v/d.  He also reports he has new le edema for the last 3-4 days also.  No chest pain.  No prior h/o chf.  He has been using his breathing treatments at home and this has not helped.  Received several albuterol nebs and solumedrol in the ED and says his breathing is some better but still not to his baseline.  Pt referred for admission for copd exacerbation.  Review of Systems:  Positive and negative as per HPI otherwise all other systems are negative  Past Medical History: Past Medical History  Diagnosis Date  . COPD (chronic obstructive pulmonary disease) ([place])   . Hypercholesteremia   . History of pneumonia   . Polycythemia   . Coronary atherosclerosis of native coronary artery     Mild nonobstructive disease at catheterization 2007  . 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 Borderline diabetes   . Seizures ([place])     Last seizure 2 yrs ago  . GERD (gastroesophageal reflux disease)   . Type 2 diabetes mellitus ([place])   . DDD (degenerative disc disease)     Cervical and thoracic  . Chronic back pain   . Bronchitis   . Chronic left shoulder pain   . Chronic neck pain   . Atrial fibrillation ([place])     Not anticoagulated  . Smoker   . Type 2 diabetes mellitus without complication ([place])   . CAP (community acquired pneumonia) 07/10/2013    04/2015  . Collagen vascular disease ([place])   . Chronic respiratory failure [facility])    Past Surgical History  Procedure Laterality Date  . Colonoscopy w/ endoscopic [place]    . Vasectomy    . Throat biopsy    . Lung biopsy    . Vasectomy  1987  . Esophagogastroduodenoscopy N/A 05/04/2015    Procedure: ESOPHAGOGASTRODUODENOSCOPY (EGD);  Surgeon: Wilford Corner, MD;  Location: [facility]  ENDOSCOPY;  Service: Endoscopy;  Laterality: N/A;    Medications: Prior to Admission medications   Medication Sig Start Date End Date Taking? Authorizing Provider  albuterol (VENTOLIN HFA) 108 (90 BASE) MCG/ACT inhaler Inhale 2 puffs into the lungs every 6 (six) hours as needed for wheezing or shortness of breath.   Yes Historical Provider, MD  benzonatate (TESSALON) 100 MG capsule Take 1 capsule (100 mg total) by mouth 3 (three) times daily as needed for cough. 05/04/15  Yes Maryann Mikhail, DO  budesonide-formoterol (SYMBICORT) 160-4.5 MCG/ACT inhaler Inhale 2 puffs into the lungs 2 (two) times daily.   Yes Historical Provider, MD  Dextromethorphan-Guaifenesin 20-400 MG TABS Take 1 tablet by mouth every 6 (six) hours as needed (mucus).   Yes Historical Provider, MD  diltiazem (CARDIZEM CD) 120 MG 24 hr capsule Take 1 capsule (120 mg total) by mouth daily. 05/11/15  Yes Lendon Colonel, NP  fluticasone (FLONASE) 50 MCG/ACT nasal spray Place 2 sprays into both nostrils daily.   Yes Historical Provider, MD  LORazepam (ATIVAN) 0.5 MG tablet Take 1 tablet (0.5 mg total) by mouth 2 (two) times daily as needed for anxiety or sleep. 05/04/15  Yes Maryann Mikhail, DO  metFORMIN (GLUCOPHAGE) 500 MG tablet Take 1 tablet (500 mg total)  by mouth daily with breakfast. 09/15/14  Yes Rexene Alberts, MD  montelukast (SINGULAIR) 10 MG tablet Take 10 mg by mouth at bedtime.   Yes Historical Provider, MD  oxyCODONE-acetaminophen (PERCOCET) 10-325 MG tablet Take 1 tablet by mouth every 4 (four) hours as needed for pain.   Yes Historical Provider, MD  oxyCODONE-acetaminophen (ROXICET) 5-325 MG per tablet Take 1 tablet by mouth every 4 (four) hours as needed for severe pain. 09/15/14  Yes Rexene Alberts, MD  pantoprazole (PROTONIX) 40 MG tablet Take 1 tablet (40 mg total) by mouth 2 (two) times daily. 05/05/15  Yes Maryann Mikhail, DO  promethazine-dextromethorphan (PROMETHAZINE-DM) 6.25-15 MG/5ML syrup Take 5 mLs by  mouth 4 (four) times daily as needed for cough.   Yes Historical Provider, MD  SPIRIVA RESPIMAT 2.5 MCG/ACT AERS Inhale 1 puff into the lungs daily. 06/20/15  Yes Historical Provider, MD  baclofen (LIORESAL) 10 MG tablet Take 10 mg by mouth 3 (three) times daily as needed. 06/05/15   Historical Provider, MD  edoxaban (SAVAYSA) 60 MG TABS tablet Take 60 mg by mouth daily. 05/11/15   Lendon Colonel, NP  levalbuterol Penne Lash) 0.63 MG/3ML nebulizer solution Take 3 mLs (0.63 mg total) by nebulization 3 (three) times daily. Patient taking differently: Take 0.63 mg by nebulization every 8 (eight) hours as needed for wheezing or shortness of breath.  09/15/14   Rexene Alberts, MD  nicotine (NICODERM CQ - DOSED IN MG/24 HOURS) 21 mg/24hr patch Place 1 patch (21 mg total) onto the skin daily. 05/05/15   Maryann Mikhail, DO  nitroGLYCERIN (NITROSTAT) 0.4 MG SL tablet Place 1 tablet (0.4 mg total) under the tongue every 5 (five) minutes as needed for chest pain. 04/22/13   Lendon Colonel, NP  predniSONE (DELTASONE) 20 MG tablet Take 2 tablets (40 mg total) by mouth daily. 07/01/15   Merryl Hacker, MD    Allergies:   Allergies  Allergen Reactions  . Albuterol Palpitations  . Influenza Vaccine Live Swelling    Social History:  reports that he quit smoking about 2 months ago. His smoking use included Cigarettes. He has a 45 pack-year smoking history. He has never used smokeless tobacco. He reports that he does not drink alcohol or use illicit drugs.  Family History: Family History  Problem Relation Age of Onset  . Hypertension Mother   . Diabetes Mother   . Heart attack Mother   . Heart failure Mother   . Hypertension Sister   . Diabetes Sister   . Heart failure Sister     Physical Exam: Filed Vitals:   07/15/15 1900 07/15/15 1930 07/15/15 1950 07/15/15 2000  BP: 131/104 152/75  139/67  Pulse: 107 125  118  Temp:      TempSrc:      Resp: 24 35  22  Height:      Weight:       SpO2: 95% 94% 96% 98%   General appearance: alert, cooperative and mild distress Head: Normocephalic, without obvious abnormality, atraumatic Eyes: negative Nose: Nares normal. Septum midline. Mucosa normal. No drainage or sinus tenderness. Neck: no JVD and supple, symmetrical, trachea midline Lungs: clear to auscultation bilaterally Heart: regular rate and rhythm Abdomen: soft, non-tender; bowel sounds normal; no masses,  no organomegaly Extremities: edema 1-2+ Pulses: 2+ and symmetric Skin: Skin color, texture, turgor normal. No rashes or lesions Neurologic: Grossly normal    Labs on Admission:   Recent Labs  07/15/15 1710  NA 136  K 3.8  CL 102  CO2 26  GLUCOSE 216*  BUN 13  CREATININE 0.96  CALCIUM 8.2*    Recent Labs  07/15/15 1710  AST 20  ALT 20  ALKPHOS 64  BILITOT 0.4  PROT 6.5  ALBUMIN 3.1*    Recent Labs  07/15/15 1710  WBC 7.0  NEUTROABS 4.8  HGB 10.3*  HCT 33.0*  MCV 76.6*  PLT 196    Radiological Exams on Admission: Dg Chest 2 View  07/15/2015  CLINICAL DATA:  SOB, WEAK, Pt on O2 at night, pt has increased sob, cough with thick brown, clear sputum, PATIENT STATES " HE IS ALSO HAVING REALLY BAD LEG SPASMS LATELY" HISTORY OF DIABETES, PNEUMONIA, ATRIAL FIB, SEIZURES, SMOKER, CAD, CHRONIC RESPIRATORY FAILURE EXAM: CHEST  2 VIEW COMPARISON:  Chest radiograph 07/01/2015 FINDINGS: Multiple monitoring leads overlie the patient. Stable cardiac and mediastinal contours. Re- demonstrated coarse bilateral interstitial pulmonary opacities involving the mid and lower lungs. Apical emphysematous change. Re- demonstrated peripheral consolidative opacity within the left upper lung. No pleural effusion pneumothorax. IMPRESSION: Findings most compatible with interstitial lung disease within the bilateral mid and lower lungs. Re- demonstrated cavitary lesion within the left upper lobe. Emphysema. Electronically Signed   By: Lovey Newcomer M.D.   On: 07/15/2015 17:51    Old chart reviewed ekg nsr no acute issues, reviewed cxr reviewed lesion in lul noted, no edema noted Case discussed with dr Eulis Foster  Assessment/Plan  55 yo male with acute on chronic respiratory failure due to copde and possible some diastolic chf  Principal Problem:   Decompensated COPD with exacerbation (chronic obstructive pulmonary disease) ([place])  ??h/o pulmonary fibrosis per dr wert note, but unclear.  Place on  Iv steroids, change his nebs to xoponex due to his tachycardia from all the albuterol.  Place on zpack.  May have a component of chf with new edema in legs, will order echo for in am.  Consider adding some lasix tomorrow if he does not improve tonight with above treatment for copd.     Active Problems:   Hyperlipidemia- noted   Type 2 diabetes mellitus without complication ([place])- hold diabetic meds, place on ssi   Cavitary lesion of lung- recommended follow up ct scans per previous notes, being followed by dr Luan Pulling, will refer to PCP   Acute on chronic respiratory failure ([place])- as above, consider multifactorial if does not respond well to copd treatment.  Admit to tele.  Full code.  pcp dr Luan Pulling.    Donathan,Athaliah Baumbach A 07/15/2015, 8:37 PM

## 2015-07-16 ENCOUNTER — Inpatient Hospital Stay (HOSPITAL_COMMUNITY): Payer: Medicare Other

## 2015-07-16 DIAGNOSIS — R0600 Dyspnea, unspecified: Secondary | ICD-10-CM

## 2015-07-16 LAB — BASIC METABOLIC PANEL
ANION GAP: 13 (ref 5–15)
BUN: 20 mg/dL (ref 6–20)
CALCIUM: 8.2 mg/dL — AB (ref 8.9–10.3)
CO2: 22 mmol/L (ref 22–32)
CREATININE: 1.15 mg/dL (ref 0.61–1.24)
Chloride: 100 mmol/L — ABNORMAL LOW (ref 101–111)
GLUCOSE: 288 mg/dL — AB (ref 65–99)
Potassium: 3.8 mmol/L (ref 3.5–5.1)
Sodium: 135 mmol/L (ref 135–145)

## 2015-07-16 LAB — CBC
HCT: 30.9 % — ABNORMAL LOW (ref 39.0–52.0)
HEMOGLOBIN: 9.6 g/dL — AB (ref 13.0–17.0)
MCH: 23.6 pg — AB (ref 26.0–34.0)
MCHC: 31.1 g/dL (ref 30.0–36.0)
MCV: 76.1 fL — ABNORMAL LOW (ref 78.0–100.0)
PLATELETS: 208 10*3/uL (ref 150–400)
RBC: 4.06 MIL/uL — ABNORMAL LOW (ref 4.22–5.81)
RDW: 16.8 % — ABNORMAL HIGH (ref 11.5–15.5)
WBC: 5.1 10*3/uL (ref 4.0–10.5)

## 2015-07-16 LAB — GLUCOSE, CAPILLARY
GLUCOSE-CAPILLARY: 294 mg/dL — AB (ref 65–99)
GLUCOSE-CAPILLARY: 223 mg/dL — AB (ref 65–99)
Glucose-Capillary: 182 mg/dL — ABNORMAL HIGH (ref 65–99)
Glucose-Capillary: 208 mg/dL — ABNORMAL HIGH (ref 65–99)

## 2015-07-16 MED ORDER — NICOTINE 14 MG/24HR TD PT24
14.0000 mg | MEDICATED_PATCH | Freq: Every day | TRANSDERMAL | Status: DC
  Administered 2015-07-16 – 2015-08-01 (×17): 14 mg via TRANSDERMAL
  Filled 2015-07-16 (×17): qty 1

## 2015-07-16 MED ORDER — OXYCODONE-ACETAMINOPHEN 5-325 MG PO TABS
2.0000 | ORAL_TABLET | ORAL | Status: DC | PRN
  Administered 2015-07-16 – 2015-07-25 (×44): 2 via ORAL
  Filled 2015-07-16 (×43): qty 2

## 2015-07-16 MED ORDER — INSULIN GLARGINE 100 UNIT/ML ~~LOC~~ SOLN
7.0000 [IU] | Freq: Every day | SUBCUTANEOUS | Status: DC
  Administered 2015-07-16 – 2015-07-17 (×2): 7 [IU] via SUBCUTANEOUS
  Filled 2015-07-16 (×3): qty 0.07

## 2015-07-16 MED ORDER — MENTHOL 3 MG MT LOZG
1.0000 | LOZENGE | OROMUCOSAL | Status: DC | PRN
  Administered 2015-07-16 – 2015-07-24 (×7): 3 mg via ORAL
  Filled 2015-07-16 (×11): qty 9

## 2015-07-16 MED ORDER — GUAIFENESIN ER 600 MG PO TB12
1200.0000 mg | ORAL_TABLET | Freq: Two times a day (BID) | ORAL | Status: DC
  Administered 2015-07-16 – 2015-07-24 (×18): 1200 mg via ORAL
  Filled 2015-07-16 (×18): qty 2

## 2015-07-16 MED ORDER — CETYLPYRIDINIUM CHLORIDE 0.05 % MT LIQD
7.0000 mL | Freq: Two times a day (BID) | OROMUCOSAL | Status: DC
  Administered 2015-07-16 – 2015-07-24 (×17): 7 mL via OROMUCOSAL

## 2015-07-16 MED ORDER — METHYLPREDNISOLONE SODIUM SUCC 125 MG IJ SOLR
80.0000 mg | Freq: Three times a day (TID) | INTRAMUSCULAR | Status: DC
  Administered 2015-07-16 – 2015-07-17 (×3): 80 mg via INTRAVENOUS
  Filled 2015-07-16 (×3): qty 2

## 2015-07-16 MED ORDER — LORAZEPAM 0.5 MG PO TABS
0.5000 mg | ORAL_TABLET | Freq: Four times a day (QID) | ORAL | Status: DC | PRN
  Administered 2015-07-17 – 2015-07-18 (×3): 0.5 mg via ORAL
  Filled 2015-07-16 (×5): qty 1

## 2015-07-16 NOTE — Progress Notes (Signed)
Subjective: He was admitted yesterday with COPD exacerbation. He is still coughing productively. He is still short of breath.  Objective: Vital signs in last 24 hours: Temp:  [98 F (36.7 C)-98.6 F (37 C)] 98 F (36.7 C) (01/02 0531) Pulse Rate:  [69-125] 86 (01/02 0849) Resp:  [18-36] 24 (01/02 0849) BP: (104-152)/(53-104) 125/53 mmHg (01/02 0531) SpO2:  [73 %-98 %] 96 % (01/02 1048) Weight:  [65.318 kg (144 lb)-68.629 kg (151 lb 4.8 oz)] 68.629 kg (151 lb 4.8 oz) (01/02 0654) Weight change:  Last BM Date: 07/15/15  Intake/Output from previous day: 01/01 0701 - 01/02 0700 In: -  Out: 350 [Urine:350]  PHYSICAL EXAM General appearance: alert, cooperative and moderate distress Resp: rhonchi bilaterally Cardio: regular rate and rhythm, S1, S2 normal, no murmur, click, rub or gallop GI: soft, non-tender; bowel sounds normal; no masses,  no organomegaly Extremities: extremities normal, atraumatic, no cyanosis or edema  Lab Results:  Results for orders placed or performed during the hospital encounter of 07/15/15 (from the past 48 hour(s))  CBC with Differential     Status: Abnormal   Collection Time: 07/15/15  5:10 PM  Result Value Ref Range   WBC 7.0 4.0 - 10.5 K/uL   RBC 4.31 4.22 - 5.81 MIL/uL   Hemoglobin 10.3 (L) 13.0 - 17.0 g/dL   HCT 33.0 (L) 39.0 - 52.0 %   MCV 76.6 (L) 78.0 - 100.0 fL   MCH 23.9 (L) 26.0 - 34.0 pg   MCHC 31.2 30.0 - 36.0 g/dL   RDW 16.6 (H) 11.5 - 15.5 %   Platelets 196 150 - 400 K/uL   Neutrophils Relative % 69 %   Neutro Abs 4.8 1.7 - 7.7 K/uL   Lymphocytes Relative 18 %   Lymphs Abs 1.3 0.7 - 4.0 K/uL   Monocytes Relative 12 %   Monocytes Absolute 0.8 0.1 - 1.0 K/uL   Eosinophils Relative 1 %   Eosinophils Absolute 0.1 0.0 - 0.7 K/uL   Basophils Relative 0 %   Basophils Absolute 0.0 0.0 - 0.1 K/uL  Comprehensive metabolic panel     Status: Abnormal   Collection Time: 07/15/15  5:10 PM  Result Value Ref Range   Sodium 136 135 - 145  mmol/L   Potassium 3.8 3.5 - 5.1 mmol/L   Chloride 102 101 - 111 mmol/L   CO2 26 22 - 32 mmol/L   Glucose, Bld 216 (H) 65 - 99 mg/dL   BUN 13 6 - 20 mg/dL   Creatinine, Ser 0.96 0.61 - 1.24 mg/dL   Calcium 8.2 (L) 8.9 - 10.3 mg/dL   Total Protein 6.5 6.5 - 8.1 g/dL   Albumin 3.1 (L) 3.5 - 5.0 g/dL   AST 20 15 - 41 U/L   ALT 20 17 - 63 U/L   Alkaline Phosphatase 64 38 - 126 U/L   Total Bilirubin 0.4 0.3 - 1.2 mg/dL   GFR calc non Af Amer >60 >60 mL/min   GFR calc Af Amer >60 >60 mL/min    Comment: (NOTE) The eGFR has been calculated using the CKD EPI equation. This calculation has not been validated in all clinical situations. eGFR's persistently <60 mL/min signify possible Chronic Kidney Disease.    Anion gap 8 5 - 15  Brain natriuretic peptide     Status: Abnormal   Collection Time: 07/15/15  5:10 PM  Result Value Ref Range   B Natriuretic Peptide 150.0 (H) 0.0 - 100.0 pg/mL  Blood gas, arterial  Status: Abnormal   Collection Time: 07/15/15  5:51 PM  Result Value Ref Range   O2 Content 4.0 L/min   pH, Arterial 7.438 7.350 - 7.450   pCO2 arterial 36.5 35.0 - 45.0 mmHg   pO2, Arterial 76.3 (L) 80.0 - 100.0 mmHg   Bicarbonate 25.1 (H) 20.0 - 24.0 mEq/L   Acid-Base Excess 0.6 0.0 - 2.0 mmol/L   O2 Saturation 94.6 %   Collection site RIGHT RADIAL    Drawn by [id number]    Sample type ARTERIAL    Allens test (pass/fail) PASS PASS  I-stat troponin, ED     Status: None   Collection Time: 07/15/15  8:12 PM  Result Value Ref Range   Troponin i, poc 0.01 0.00 - 0.08 ng/mL   Comment 3            Comment: Due to the release kinetics of cTnI, a negative result within the first hours of the onset of symptoms does not rule out myocardial infarction with certainty. If myocardial infarction is still suspected, repeat the test at appropriate intervals.   Glucose, capillary     Status: Abnormal   Collection Time: 07/15/15 10:39 PM  Result Value Ref Range   Glucose-Capillary 397 (H)  65 - 99 mg/dL   Comment 1 Notify RN    Comment 2 Document in Chart   Basic metabolic panel     Status: Abnormal   Collection Time: 07/16/15  6:09 AM  Result Value Ref Range   Sodium 135 135 - 145 mmol/L   Potassium 3.8 3.5 - 5.1 mmol/L   Chloride 100 (L) 101 - 111 mmol/L   CO2 22 22 - 32 mmol/L   Glucose, Bld 288 (H) 65 - 99 mg/dL   BUN 20 6 - 20 mg/dL   Creatinine, Ser 1.15 0.61 - 1.24 mg/dL   Calcium 8.2 (L) 8.9 - 10.3 mg/dL   GFR calc non Af Amer >60 >60 mL/min   GFR calc Af Amer >60 >60 mL/min    Comment: (NOTE) The eGFR has been calculated using the CKD EPI equation. This calculation has not been validated in all clinical situations. eGFR's persistently <60 mL/min signify possible Chronic Kidney Disease.    Anion gap 13 5 - 15  CBC     Status: Abnormal   Collection Time: 07/16/15  6:09 AM  Result Value Ref Range   WBC 5.1 4.0 - 10.5 K/uL   RBC 4.06 (L) 4.22 - 5.81 MIL/uL   Hemoglobin 9.6 (L) 13.0 - 17.0 g/dL   HCT 30.9 (L) 39.0 - 52.0 %   MCV 76.1 (L) 78.0 - 100.0 fL   MCH 23.6 (L) 26.0 - 34.0 pg   MCHC 31.1 30.0 - 36.0 g/dL   RDW 16.8 (H) 11.5 - 15.5 %   Platelets 208 150 - 400 K/uL    ABGS  Recent Labs  07/15/15 1751  PHART 7.438  PO2ART 76.3*  HCO3 25.1*   CULTURES No results found for this or any previous visit (from the past 240 hour(s)). Studies/Results: Dg Chest 2 View  07/15/2015  CLINICAL DATA:  SOB, WEAK, Pt on O2 at night, pt has increased sob, cough with thick brown, clear sputum, PATIENT STATES " HE IS ALSO HAVING REALLY BAD LEG SPASMS LATELY" HISTORY OF DIABETES, PNEUMONIA, ATRIAL FIB, SEIZURES, SMOKER, CAD, CHRONIC RESPIRATORY FAILURE EXAM: CHEST  2 VIEW COMPARISON:  Chest radiograph 07/01/2015 FINDINGS: Multiple monitoring leads overlie the patient. Stable cardiac and mediastinal contours. Re- demonstrated coarse bilateral  interstitial pulmonary opacities involving the mid and lower lungs. Apical emphysematous change. Re- demonstrated peripheral  consolidative opacity within the left upper lung. No pleural effusion pneumothorax. IMPRESSION: Findings most compatible with interstitial lung disease within the bilateral mid and lower lungs. Re- demonstrated cavitary lesion within the left upper lobe. Emphysema. Electronically Signed   By: Lovey Newcomer M.D.   On: 07/15/2015 17:51    Medications:  Prior to Admission:  Prescriptions prior to admission  Medication Sig Dispense Refill Last Dose  . albuterol (VENTOLIN HFA) 108 (90 BASE) MCG/ACT inhaler Inhale 2 puffs into the lungs every 6 (six) hours as needed for wheezing or shortness of breath.   Past Week  . benzonatate (TESSALON) 100 MG capsule Take 1 capsule (100 mg total) by mouth 3 (three) times daily as needed for cough. 30 capsule 0 07/15/2015  . budesonide-formoterol (SYMBICORT) 160-4.5 MCG/ACT inhaler Inhale 2 puffs into the lungs 2 (two) times daily.   07/14/2015  . Dextromethorphan-Guaifenesin 20-400 MG TABS Take 1 tablet by mouth every 6 (six) hours as needed (mucus).   Past Week  . diltiazem (CARDIZEM CD) 120 MG 24 hr capsule Take 1 capsule (120 mg total) by mouth daily. 30 capsule 11 07/15/2015  . fluticasone (FLONASE) 50 MCG/ACT nasal spray Place 2 sprays into both nostrils daily.   07/14/2015  . LORazepam (ATIVAN) 0.5 MG tablet Take 1 tablet (0.5 mg total) by mouth 2 (two) times daily as needed for anxiety or sleep. 20 tablet 0 07/15/2015  . metFORMIN (GLUCOPHAGE) 500 MG tablet Take 1 tablet (500 mg total) by mouth daily with breakfast. 30 tablet 3 07/15/2015  . montelukast (SINGULAIR) 10 MG tablet Take 10 mg by mouth at bedtime.   07/14/2015  . oxyCODONE-acetaminophen (PERCOCET) 10-325 MG tablet Take 1 tablet by mouth every 4 (four) hours as needed for pain.   07/14/2015  . oxyCODONE-acetaminophen (ROXICET) 5-325 MG per tablet Take 1 tablet by mouth every 4 (four) hours as needed for severe pain. 25 tablet 0 07/14/2015  . pantoprazole (PROTONIX) 40 MG tablet Take 1 tablet (40 mg total) by  mouth 2 (two) times daily. 60 tablet 0 07/15/2015  . promethazine-dextromethorphan (PROMETHAZINE-DM) 6.25-15 MG/5ML syrup Take 5 mLs by mouth 4 (four) times daily as needed for cough.   07/14/2015  . SPIRIVA RESPIMAT 2.5 MCG/ACT AERS Inhale 1 puff into the lungs daily.   07/15/2015  . baclofen (LIORESAL) 10 MG tablet Take 10 mg by mouth 3 (three) times daily as needed.   Unknown  . edoxaban (SAVAYSA) 60 MG TABS tablet Take 60 mg by mouth daily. 30 tablet 11 Couple Weeks  . levalbuterol (XOPENEX) 0.63 MG/3ML nebulizer solution Take 3 mLs (0.63 mg total) by nebulization 3 (three) times daily. (Patient taking differently: Take 0.63 mg by nebulization every 8 (eight) hours as needed for wheezing or shortness of breath. ) 3 mL 12 07/12/2015  . nicotine (NICODERM CQ - DOSED IN MG/24 HOURS) 21 mg/24hr patch Place 1 patch (21 mg total) onto the skin daily. 28 patch 0 Taking  . nitroGLYCERIN (NITROSTAT) 0.4 MG SL tablet Place 1 tablet (0.4 mg total) under the tongue every 5 (five) minutes as needed for chest pain. 25 tablet 4 Never  . predniSONE (DELTASONE) 20 MG tablet Take 2 tablets (40 mg total) by mouth daily. 10 tablet 0    Scheduled: . antiseptic oral rinse  7 mL Mouth Rinse BID  . [START ON 07/17/2015] azithromycin  250 mg Oral Daily  . diltiazem  120  mg Oral Daily  . edoxaban  60 mg Oral Daily  . fluticasone  2 spray Each Nare Daily  . guaiFENesin  1,200 mg Oral BID  . insulin aspart  0-9 Units Subcutaneous TID WC  . ipratropium  0.5 mg Nebulization Q6H  . levalbuterol  0.63 mg Nebulization Q6H  . methylPREDNISolone (SOLU-MEDROL) injection  80 mg Intravenous Q8H  . montelukast  10 mg Oral QHS  . pantoprazole  40 mg Oral BID  . sodium chloride  3 mL Intravenous Q12H  . sodium chloride  3 mL Intravenous Q12H   Continuous:  PQZ:RAQTMA chloride, benzonatate, levalbuterol, LORazepam, nitroGLYCERIN, ondansetron **OR** ondansetron (ZOFRAN) IV, oxyCODONE-acetaminophen, sodium chloride  Assesment: He  was admitted with decompensated COPD and respiratory failure. He is chronically anticoagulated. He has diabetes and his blood sugar is not controlled. He has chronic pain and says his pain is not controlled Principal Problem:   Decompensated COPD with exacerbation (chronic obstructive pulmonary disease) (HCC) Active Problems:   Hyperlipidemia   Type 2 diabetes mellitus without complication (HCC)   Cavitary lesion of lung   Acute on chronic respiratory failure (HCC)   COPD exacerbation ([place])    Plan: I changed his pain medication. Continue other treatments. Increase his oxygen level because he was somewhat hypoxic. Continue everything else. Add Lantus 7 units daily    LOS: 1 day   Elward Nocera L 07/16/2015, 11:39 AM

## 2015-07-16 NOTE — Care Management Note (Signed)
Case Management Note  Patient Details  Name: LENVIL SWAIM MRN: [id number] Date of Birth: 1961/05/15  Subjective/Objective:                  Pt admitted with COPD exacerbation. Pt is from home, lives with wife and is ind with ADL's. Pt has home O2 with concentrator and port tanks from [employer]. Pt has walker he uses PRN. Pt has neb machine. Pt says he has not been taking his anticoagulant the past few weeks because he is unable to afford it. States he has applied for med assistance in the past, unsure if he was accepted or not. Pt plans to return home with self care.  Action/Plan: CM will determine if pt is eligible for med assistance through drug company, discuss options with cardiologist.   Expected Discharge Date:    07/17/2014              Expected Discharge Plan:  Home/Self Care  In-House Referral:  NA  Discharge planning Services  CM Consult  Post Acute Care Choice:  NA Choice offered to:  NA  DME Arranged:    DME Agency:     HH Arranged:    HH Agency:     Status of Service:  In process, will continue to follow  Medicare Important Message Given:    Date Medicare IM Given:    Medicare IM give by:    Date Additional Medicare IM Given:    Additional Medicare Important Message give by:     If discussed at [place] of Stay Meetings, dates discussed:    Additional Comments:  Sherald Barge, RN 07/16/2015, 3:43 PM

## 2015-07-16 NOTE — Progress Notes (Signed)
Patient has a blood sugar of 397. Does not have any bedtime coverage ordered. Paged midlevel. Order received to give 5 units of novolog tonight.

## 2015-07-16 NOTE — Progress Notes (Signed)
Inpatient Diabetes Program Recommendations  AACE/ADA: New Consensus Statement on Inpatient Glycemic Control (2015)  Target Ranges:  Prepandial:   less than 140 mg/dL      Peak postprandial:   less than 180 mg/dL (1-2 hours)      Critically ill patients:  140 - 180 mg/dL  Results for Ryan [name], Ryan [name] (MRN [id number]) as of 07/16/2015 07:49  Ref. Range 07/15/2015 17:10 07/16/2015 06:09  Glucose Latest Ref Range: 65-99 mg/dL 216 (H) 288 (H)  Results for [name], [name] (MRN [id number]) as of 07/16/2015 07:49  Ref. Range 07/15/2015 22:39  Glucose-Capillary Latest Ref Range: 65-99 mg/dL 397 (H)   Review of Glycemic Control  Diabetes history: DM2 Outpatient Diabetes medications: Metformin 500 mg QAM Current orders for Inpatient glycemic control: Novolog 0-9 units TID with meals  Inpatient Diabetes Program Recommendations: Insulin - Basal: If steroids are continued as ordered, please consider ordering low dose basal insulin. Recommend starting with Lantus 7 units daily (based on 68.6 kg x 0.1 units). Correction (SSI): Please consider increasing Novolog correction to moderate scale and adding Novolog bedtime correction scale.  Thanks, Barnie Alderman, RN, MSN, CDE Diabetes Coordinator Inpatient Diabetes Program [phone] (Team Pager from [place] to [place]) [phone] ([facility] office) [phone] [facility] office) [phone] [facility] office)

## 2015-07-17 LAB — CBC WITH DIFFERENTIAL/PLATELET
Basophils Absolute: 0 10*3/uL (ref 0.0–0.1)
Basophils Relative: 0 %
EOS ABS: 0 10*3/uL (ref 0.0–0.7)
EOS PCT: 0 %
HCT: 32.9 % — ABNORMAL LOW (ref 39.0–52.0)
HEMOGLOBIN: 10.2 g/dL — AB (ref 13.0–17.0)
LYMPHS ABS: 1.3 10*3/uL (ref 0.7–4.0)
LYMPHS PCT: 10 %
MCH: 23.6 pg — AB (ref 26.0–34.0)
MCHC: 31 g/dL (ref 30.0–36.0)
MCV: 76.2 fL — AB (ref 78.0–100.0)
MONOS PCT: 4 %
Monocytes Absolute: 0.5 10*3/uL (ref 0.1–1.0)
NEUTROS PCT: 86 %
Neutro Abs: 10.5 10*3/uL — ABNORMAL HIGH (ref 1.7–7.7)
Platelets: 224 10*3/uL (ref 150–400)
RBC: 4.32 MIL/uL (ref 4.22–5.81)
RDW: 16.7 % — ABNORMAL HIGH (ref 11.5–15.5)
WBC: 12.4 10*3/uL — AB (ref 4.0–10.5)

## 2015-07-17 LAB — GLUCOSE, CAPILLARY
GLUCOSE-CAPILLARY: 206 mg/dL — AB (ref 65–99)
GLUCOSE-CAPILLARY: 239 mg/dL — AB (ref 65–99)
Glucose-Capillary: 203 mg/dL — ABNORMAL HIGH (ref 65–99)
Glucose-Capillary: 269 mg/dL — ABNORMAL HIGH (ref 65–99)

## 2015-07-17 LAB — BASIC METABOLIC PANEL
Anion gap: 11 (ref 5–15)
BUN: 22 mg/dL — AB (ref 6–20)
CHLORIDE: 102 mmol/L (ref 101–111)
CO2: 24 mmol/L (ref 22–32)
CREATININE: 0.95 mg/dL (ref 0.61–1.24)
Calcium: 8.7 mg/dL — ABNORMAL LOW (ref 8.9–10.3)
GFR calc Af Amer: >60 mL/min (ref >60)
GFR calc non Af Amer: >60 mL/min (ref >60)
GLUCOSE: 219 mg/dL — AB (ref 65–99)
POTASSIUM: 4.7 mmol/L (ref 3.5–5.1)
SODIUM: 137 mmol/L (ref 135–145)

## 2015-07-17 MED ORDER — BENZONATATE 100 MG PO CAPS
100.0000 mg | ORAL_CAPSULE | Freq: Three times a day (TID) | ORAL | Status: DC
  Administered 2015-07-17 – 2015-07-24 (×23): 100 mg via ORAL
  Filled 2015-07-17 (×23): qty 1

## 2015-07-17 MED ORDER — HYDROCODONE-HOMATROPINE 5-1.5 MG/5ML PO SYRP
5.0000 mL | ORAL_SOLUTION | ORAL | Status: DC | PRN
  Administered 2015-07-17 – 2015-07-25 (×40): 5 mL via ORAL
  Filled 2015-07-17 (×40): qty 5

## 2015-07-17 MED ORDER — METHYLPREDNISOLONE SODIUM SUCC 125 MG IJ SOLR
125.0000 mg | Freq: Four times a day (QID) | INTRAMUSCULAR | Status: DC
  Administered 2015-07-17 – 2015-07-21 (×16): 125 mg via INTRAVENOUS
  Filled 2015-07-17 (×16): qty 2

## 2015-07-17 MED ORDER — LEVALBUTEROL HCL 0.63 MG/3ML IN NEBU
1.2500 mg | INHALATION_SOLUTION | Freq: Four times a day (QID) | RESPIRATORY_TRACT | Status: DC
  Administered 2015-07-17 – 2015-07-20 (×12): 1.25 mg via RESPIRATORY_TRACT
  Filled 2015-07-17 (×13): qty 6

## 2015-07-17 NOTE — Progress Notes (Signed)
Nurse tech called me to room due to patient having SOB.  Patient had just had coughing spell and oxygen level was 79%, HR 130-142 bpm.  Increased oxygen from 4 to 6 lpm.  Patient's oxygen level slowly came back up after coughing spell was over.  Patient now calm, breathing back to normal, oxygen level back to normal, will continue to monitor.

## 2015-07-17 NOTE — Progress Notes (Signed)
Inpatient Diabetes Program Recommendations  AACE/ADA: New Consensus Statement on Inpatient Glycemic Control (2015)  Target Ranges:  Prepandial:   less than 140 mg/dL      Peak postprandial:   less than 180 mg/dL (1-2 hours)      Critically ill patients:  140 - 180 mg/dL   Review of Glycemic Control:  Results for DALTON, MOLESWORTH (MRN [id number]) as of 07/17/2015 11:03  Ref. Range 07/16/2015 08:46 07/16/2015 11:40 07/16/2015 16:06 07/16/2015 20:47 07/17/2015 07:16  Glucose-Capillary Latest Ref Range: 65-99 mg/dL 294 (H) 182 (H) 208 (H) 223 (H) 203 (H)   Diabetes history: Type 2 diabetes Outpatient Diabetes medications: Metformin 500 mg q AM Current orders for Inpatient glycemic control: Novolog sensitive tid with meals, Lantus 7 units daily  Inpatient Diabetes Program Recommendations: Please consider increasing Lantus to 15 units daily.  Also consider increasing Novolog to moderate tid with meals while patient is on steroids.  Thanks, Adah Perl, RN, BC-ADM Inpatient Diabetes Coordinator Pager [phone] (8a-5p)

## 2015-07-17 NOTE — Progress Notes (Signed)
Subjective: He continues to be coughing. He is coughing up a lot of sputum. He is still short of breath.  Objective: Vital signs in last 24 hours: Temp:  [97.7 F (36.5 C)-98.1 F (36.7 C)] 97.7 F (36.5 C) (01/03 0423) Pulse Rate:  [83-93] 93 (01/03 0423) Resp:  [18-24] 20 (01/03 0423) BP: (102-126)/(57-73) 126/73 mmHg (01/03 0423) SpO2:  [88 %-98 %] 98 % (01/03 0723) Weight:  [68.085 kg (150 lb 1.6 oz)] 68.085 kg (150 lb 1.6 oz) (01/03 0423) Weight change: 2.767 kg (6 lb 1.6 oz) Last BM Date: 07/16/15  Intake/Output from previous day: 01/02 0701 - 01/03 0700 In: 960 [P.O.:960] Out: 950 [Urine:950]  PHYSICAL EXAM General appearance: alert, cooperative and moderate distress Resp: rhonchi bilaterally and wheezes bilaterally Cardio: irregularly irregular rhythm GI: soft, non-tender; bowel sounds normal; no masses,  no organomegaly Extremities: extremities normal, atraumatic, no cyanosis or edema  Lab Results:  Results for orders placed or performed during the hospital encounter of 07/15/15 (from the past 48 hour(s))  CBC with Differential     Status: Abnormal   Collection Time: 07/15/15  5:10 PM  Result Value Ref Range   WBC 7.0 4.0 - 10.5 K/uL   RBC 4.31 4.22 - 5.81 MIL/uL   Hemoglobin 10.3 (L) 13.0 - 17.0 g/dL   HCT 33.0 (L) 39.0 - 52.0 %   MCV 76.6 (L) 78.0 - 100.0 fL   MCH 23.9 (L) 26.0 - 34.0 pg   MCHC 31.2 30.0 - 36.0 g/dL   RDW 16.6 (H) 11.5 - 15.5 %   Platelets 196 150 - 400 K/uL   Neutrophils Relative % 69 %   Neutro Abs 4.8 1.7 - 7.7 K/uL   Lymphocytes Relative 18 %   Lymphs Abs 1.3 0.7 - 4.0 K/uL   Monocytes Relative 12 %   Monocytes Absolute 0.8 0.1 - 1.0 K/uL   Eosinophils Relative 1 %   Eosinophils Absolute 0.1 0.0 - 0.7 K/uL   Basophils Relative 0 %   Basophils Absolute 0.0 0.0 - 0.1 K/uL  Comprehensive metabolic panel     Status: Abnormal   Collection Time: 07/15/15  5:10 PM  Result Value Ref Range   Sodium 136 135 - 145 mmol/L   Potassium 3.8  3.5 - 5.1 mmol/L   Chloride 102 101 - 111 mmol/L   CO2 26 22 - 32 mmol/L   Glucose, Bld 216 (H) 65 - 99 mg/dL   BUN 13 6 - 20 mg/dL   Creatinine, Ser 0.96 0.61 - 1.24 mg/dL   Calcium 8.2 (L) 8.9 - 10.3 mg/dL   Total Protein 6.5 6.5 - 8.1 g/dL   Albumin 3.1 (L) 3.5 - 5.0 g/dL   AST 20 15 - 41 U/L   ALT 20 17 - 63 U/L   Alkaline Phosphatase 64 38 - 126 U/L   Total Bilirubin 0.4 0.3 - 1.2 mg/dL   GFR calc non Af Amer >60 >60 mL/min   GFR calc Af Amer >60 >60 mL/min    Comment: (NOTE) The eGFR has been calculated using the CKD EPI equation. This calculation has not been validated in all clinical situations. eGFR's persistently <60 mL/min signify possible Chronic Kidney Disease.    Anion gap 8 5 - 15  Brain natriuretic peptide     Status: Abnormal   Collection Time: 07/15/15  5:10 PM  Result Value Ref Range   B Natriuretic Peptide 150.0 (H) 0.0 - 100.0 pg/mL  Blood gas, arterial     Status:  Abnormal   Collection Time: 07/15/15  5:51 PM  Result Value Ref Range   O2 Content 4.0 L/min   pH, Arterial 7.438 7.350 - 7.450   pCO2 arterial 36.5 35.0 - 45.0 mmHg   pO2, Arterial 76.3 (L) 80.0 - 100.0 mmHg   Bicarbonate 25.1 (H) 20.0 - 24.0 mEq/L   Acid-Base Excess 0.6 0.0 - 2.0 mmol/L   O2 Saturation 94.6 %   Collection site RIGHT RADIAL    Drawn by [id number]    Sample type ARTERIAL    Allens test (pass/fail) PASS PASS  I-stat troponin, ED     Status: None   Collection Time: 07/15/15  8:12 PM  Result Value Ref Range   Troponin i, poc 0.01 0.00 - 0.08 ng/mL   Comment 3            Comment: Due to the release kinetics of cTnI, a negative result within the first hours of the onset of symptoms does not rule out myocardial infarction with certainty. If myocardial infarction is still suspected, repeat the test at appropriate intervals.   Glucose, capillary     Status: Abnormal   Collection Time: 07/15/15 10:39 PM  Result Value Ref Range   Glucose-Capillary 397 (H) 65 - 99 mg/dL    Comment 1 Notify RN    Comment 2 Document in Chart   Basic metabolic panel     Status: Abnormal   Collection Time: 07/16/15  6:09 AM  Result Value Ref Range   Sodium 135 135 - 145 mmol/L   Potassium 3.8 3.5 - 5.1 mmol/L   Chloride 100 (L) 101 - 111 mmol/L   CO2 22 22 - 32 mmol/L   Glucose, Bld 288 (H) 65 - 99 mg/dL   BUN 20 6 - 20 mg/dL   Creatinine, Ser 1.15 0.61 - 1.24 mg/dL   Calcium 8.2 (L) 8.9 - 10.3 mg/dL   GFR calc non Af Amer >60 >60 mL/min   GFR calc Af Amer >60 >60 mL/min    Comment: (NOTE) The eGFR has been calculated using the CKD EPI equation. This calculation has not been validated in all clinical situations. eGFR's persistently <60 mL/min signify possible Chronic Kidney Disease.    Anion gap 13 5 - 15  CBC     Status: Abnormal   Collection Time: 07/16/15  6:09 AM  Result Value Ref Range   WBC 5.1 4.0 - 10.5 K/uL   RBC 4.06 (L) 4.22 - 5.81 MIL/uL   Hemoglobin 9.6 (L) 13.0 - 17.0 g/dL   HCT 30.9 (L) 39.0 - 52.0 %   MCV 76.1 (L) 78.0 - 100.0 fL   MCH 23.6 (L) 26.0 - 34.0 pg   MCHC 31.1 30.0 - 36.0 g/dL   RDW 16.8 (H) 11.5 - 15.5 %   Platelets 208 150 - 400 K/uL  Glucose, capillary     Status: Abnormal   Collection Time: 07/16/15  8:46 AM  Result Value Ref Range   Glucose-Capillary 294 (H) 65 - 99 mg/dL   Comment 1 Notify RN    Comment 2 Document in Chart   Glucose, capillary     Status: Abnormal   Collection Time: 07/16/15 11:40 AM  Result Value Ref Range   Glucose-Capillary 182 (H) 65 - 99 mg/dL   Comment 1 Notify RN    Comment 2 Document in Chart   Glucose, capillary     Status: Abnormal   Collection Time: 07/16/15  4:06 PM  Result Value Ref Range  Glucose-Capillary 208 (H) 65 - 99 mg/dL   Comment 1 Notify RN    Comment 2 Document in Chart   Glucose, capillary     Status: Abnormal   Collection Time: 07/16/15  8:47 PM  Result Value Ref Range   Glucose-Capillary 223 (H) 65 - 99 mg/dL   Comment 1 Notify RN    Comment 2 Document in Chart   CBC with  Differential/Platelet     Status: Abnormal   Collection Time: 07/17/15  5:23 AM  Result Value Ref Range   WBC 12.4 (H) 4.0 - 10.5 K/uL   RBC 4.32 4.22 - 5.81 MIL/uL   Hemoglobin 10.2 (L) 13.0 - 17.0 g/dL   HCT 32.9 (L) 39.0 - 52.0 %   MCV 76.2 (L) 78.0 - 100.0 fL   MCH 23.6 (L) 26.0 - 34.0 pg   MCHC 31.0 30.0 - 36.0 g/dL   RDW 16.7 (H) 11.5 - 15.5 %   Platelets 224 150 - 400 K/uL   Neutrophils Relative % 86 %   Neutro Abs 10.5 (H) 1.7 - 7.7 K/uL   Lymphocytes Relative 10 %   Lymphs Abs 1.3 0.7 - 4.0 K/uL   Monocytes Relative 4 %   Monocytes Absolute 0.5 0.1 - 1.0 K/uL   Eosinophils Relative 0 %   Eosinophils Absolute 0.0 0.0 - 0.7 K/uL   Basophils Relative 0 %   Basophils Absolute 0.0 0.0 - 0.1 K/uL  Basic metabolic panel     Status: Abnormal   Collection Time: 07/17/15  5:23 AM  Result Value Ref Range   Sodium 137 135 - 145 mmol/L   Potassium 4.7 3.5 - 5.1 mmol/L    Comment: DELTA CHECK NOTED   Chloride 102 101 - 111 mmol/L   CO2 24 22 - 32 mmol/L   Glucose, Bld 219 (H) 65 - 99 mg/dL   BUN 22 (H) 6 - 20 mg/dL   Creatinine, Ser 0.95 0.61 - 1.24 mg/dL   Calcium 8.7 (L) 8.9 - 10.3 mg/dL   GFR calc non Af Amer >60 >60 mL/min   GFR calc Af Amer >60 >60 mL/min    Comment: (NOTE) The eGFR has been calculated using the CKD EPI equation. This calculation has not been validated in all clinical situations. eGFR's persistently <60 mL/min signify possible Chronic Kidney Disease.    Anion gap 11 5 - 15  Glucose, capillary     Status: Abnormal   Collection Time: 07/17/15  7:16 AM  Result Value Ref Range   Glucose-Capillary 203 (H) 65 - 99 mg/dL   Comment 1 Notify RN    Comment 2 Document in Chart     ABGS  Recent Labs  07/15/15 1751  PHART 7.438  PO2ART 76.3*  HCO3 25.1*   CULTURES No results found for this or any previous visit (from the past 240 hour(s)). Studies/Results: Dg Chest 2 View  07/15/2015  CLINICAL DATA:  SOB, WEAK, Pt on O2 at night, pt has increased  sob, cough with thick brown, clear sputum, PATIENT STATES " HE IS ALSO HAVING REALLY BAD LEG SPASMS LATELY" HISTORY OF DIABETES, PNEUMONIA, ATRIAL FIB, SEIZURES, SMOKER, CAD, CHRONIC RESPIRATORY FAILURE EXAM: CHEST  2 VIEW COMPARISON:  Chest radiograph 07/01/2015 FINDINGS: Multiple monitoring leads overlie the patient. Stable cardiac and mediastinal contours. Re- demonstrated coarse bilateral interstitial pulmonary opacities involving the mid and lower lungs. Apical emphysematous change. Re- demonstrated peripheral consolidative opacity within the left upper lung. No pleural effusion pneumothorax. IMPRESSION: Findings most compatible with  interstitial lung disease within the bilateral mid and lower lungs. Re- demonstrated cavitary lesion within the left upper lobe. Emphysema. Electronically Signed   By: Lovey Newcomer M.D.   On: 07/15/2015 17:51    Medications:  Prior to Admission:  Prescriptions prior to admission  Medication Sig Dispense Refill Last Dose  . albuterol (VENTOLIN HFA) 108 (90 BASE) MCG/ACT inhaler Inhale 2 puffs into the lungs every 6 (six) hours as needed for wheezing or shortness of breath.   Past Week  . benzonatate (TESSALON) 100 MG capsule Take 1 capsule (100 mg total) by mouth 3 (three) times daily as needed for cough. 30 capsule 0 07/15/2015  . budesonide-formoterol (SYMBICORT) 160-4.5 MCG/ACT inhaler Inhale 2 puffs into the lungs 2 (two) times daily.   07/14/2015  . Dextromethorphan-Guaifenesin 20-400 MG TABS Take 1 tablet by mouth every 6 (six) hours as needed (mucus).   Past Week  . diltiazem (CARDIZEM CD) 120 MG 24 hr capsule Take 1 capsule (120 mg total) by mouth daily. 30 capsule 11 07/15/2015  . fluticasone (FLONASE) 50 MCG/ACT nasal spray Place 2 sprays into both nostrils daily.   07/14/2015  . LORazepam (ATIVAN) 0.5 MG tablet Take 1 tablet (0.5 mg total) by mouth 2 (two) times daily as needed for anxiety or sleep. 20 tablet 0 07/15/2015  . metFORMIN (GLUCOPHAGE) 500 MG tablet  Take 1 tablet (500 mg total) by mouth daily with breakfast. 30 tablet 3 07/15/2015  . montelukast (SINGULAIR) 10 MG tablet Take 10 mg by mouth at bedtime.   07/14/2015  . oxyCODONE-acetaminophen (PERCOCET) 10-325 MG tablet Take 1 tablet by mouth every 4 (four) hours as needed for pain.   07/14/2015  . oxyCODONE-acetaminophen (ROXICET) 5-325 MG per tablet Take 1 tablet by mouth every 4 (four) hours as needed for severe pain. 25 tablet 0 07/14/2015  . pantoprazole (PROTONIX) 40 MG tablet Take 1 tablet (40 mg total) by mouth 2 (two) times daily. 60 tablet 0 07/15/2015  . promethazine-dextromethorphan (PROMETHAZINE-DM) 6.25-15 MG/5ML syrup Take 5 mLs by mouth 4 (four) times daily as needed for cough.   07/14/2015  . SPIRIVA RESPIMAT 2.5 MCG/ACT AERS Inhale 1 puff into the lungs daily.   07/15/2015  . baclofen (LIORESAL) 10 MG tablet Take 10 mg by mouth 3 (three) times daily as needed.   Unknown  . edoxaban (SAVAYSA) 60 MG TABS tablet Take 60 mg by mouth daily. 30 tablet 11 Couple Weeks  . levalbuterol (XOPENEX) 0.63 MG/3ML nebulizer solution Take 3 mLs (0.63 mg total) by nebulization 3 (three) times daily. (Patient taking differently: Take 0.63 mg by nebulization every 8 (eight) hours as needed for wheezing or shortness of breath. ) 3 mL 12 07/12/2015  . nicotine (NICODERM CQ - DOSED IN MG/24 HOURS) 21 mg/24hr patch Place 1 patch (21 mg total) onto the skin daily. 28 patch 0 Taking  . nitroGLYCERIN (NITROSTAT) 0.4 MG SL tablet Place 1 tablet (0.4 mg total) under the tongue every 5 (five) minutes as needed for chest pain. 25 tablet 4 Never  . predniSONE (DELTASONE) 20 MG tablet Take 2 tablets (40 mg total) by mouth daily. 10 tablet 0    Scheduled: . antiseptic oral rinse  7 mL Mouth Rinse BID  . azithromycin  250 mg Oral Daily  . benzonatate  100 mg Oral TID  . diltiazem  120 mg Oral Daily  . edoxaban  60 mg Oral Daily  . fluticasone  2 spray Each Nare Daily  . guaiFENesin  1,200 mg Oral  BID  . insulin  aspart  0-9 Units Subcutaneous TID WC  . insulin glargine  7 Units Subcutaneous Daily  . ipratropium  0.5 mg Nebulization Q6H  . levalbuterol  1.25 mg Nebulization Q6H  . methylPREDNISolone (SOLU-MEDROL) injection  125 mg Intravenous Q6H  . montelukast  10 mg Oral QHS  . nicotine  14 mg Transdermal Daily  . pantoprazole  40 mg Oral BID  . sodium chloride  3 mL Intravenous Q12H  . sodium chloride  3 mL Intravenous Q12H   Continuous:  JQB:HALPFX chloride, HYDROcodone-homatropine, levalbuterol, LORazepam, menthol-cetylpyridinium, nitroGLYCERIN, ondansetron **OR** ondansetron (ZOFRAN) IV, oxyCODONE-acetaminophen, sodium chloride  Assesment: He has COPD exacerbation and acute on chronic respiratory failure. He has chronic atrial fibrillation and has been anticoagulated but is not always taking his anticoagulant. He says he is still having significant shortness of breath and cough. In the past he has required BiPAP but I don't think he needs that now. Principal Problem:   Decompensated COPD with exacerbation (chronic obstructive pulmonary disease) (HCC) Active Problems:   Hyperlipidemia   Type 2 diabetes mellitus without complication (HCC)   Cavitary lesion of lung   Acute on chronic respiratory failure (HCC)   COPD exacerbation ([place])    Plan: Continue current treatments. Increase his steroids. Increase his nebulizer treatments. I'm going to give him a cough suppressant to see if it will let him be more comfortable. He is not ready for discharge    LOS: 2 days   Marion Seese L 07/17/2015, 8:41 AM

## 2015-07-18 LAB — GLUCOSE, CAPILLARY
GLUCOSE-CAPILLARY: 274 mg/dL — AB (ref 65–99)
GLUCOSE-CAPILLARY: 305 mg/dL — AB (ref 65–99)
Glucose-Capillary: 194 mg/dL — ABNORMAL HIGH (ref 65–99)
Glucose-Capillary: 304 mg/dL — ABNORMAL HIGH (ref 65–99)

## 2015-07-18 MED ORDER — INSULIN ASPART 100 UNIT/ML ~~LOC~~ SOLN
0.0000 [IU] | Freq: Three times a day (TID) | SUBCUTANEOUS | Status: DC
  Administered 2015-07-18: 8 [IU] via SUBCUTANEOUS
  Administered 2015-07-18: 11 [IU] via SUBCUTANEOUS
  Administered 2015-07-18: 3 [IU] via SUBCUTANEOUS
  Administered 2015-07-19: 8 [IU] via SUBCUTANEOUS
  Administered 2015-07-19: 11 [IU] via SUBCUTANEOUS
  Administered 2015-07-19: 8 [IU] via SUBCUTANEOUS

## 2015-07-18 MED ORDER — INSULIN GLARGINE 100 UNIT/ML ~~LOC~~ SOLN
12.0000 [IU] | Freq: Every day | SUBCUTANEOUS | Status: DC
  Administered 2015-07-18 – 2015-07-20 (×3): 12 [IU] via SUBCUTANEOUS
  Filled 2015-07-18 (×6): qty 0.12

## 2015-07-18 MED ORDER — INSULIN ASPART 100 UNIT/ML ~~LOC~~ SOLN
0.0000 [IU] | Freq: Every day | SUBCUTANEOUS | Status: DC
  Administered 2015-07-18 – 2015-07-19 (×2): 4 [IU] via SUBCUTANEOUS

## 2015-07-18 MED ORDER — INSULIN ASPART 100 UNIT/ML ~~LOC~~ SOLN: 0.0000 [IU] | Freq: Three times a day (TID) | SUBCUTANEOUS | Status: DC

## 2015-07-18 NOTE — Progress Notes (Signed)
Subjective: He says he feels better. He is still short of breath and still coughing up sputum. He has no other new complaints. His blood sugar has been elevated.  Objective: Vital signs in last 24 hours: Temp:  [97.6 F (36.4 C)-98 F (36.7 C)] 97.7 F (36.5 C) (01/04 0630) Pulse Rate:  [75-94] 82 (01/04 0630) Resp:  [18-20] 20 (01/04 0630) BP: (116-150)/(71-88) 116/72 mmHg (01/04 0630) SpO2:  [93 %-97 %] 96 % (01/04 0716) Weight change:  Last BM Date: 07/16/15  Intake/Output from previous day: 01/03 0701 - 01/04 0700 In: 720 [P.O.:720] Out: 1300 [Urine:1300]  PHYSICAL EXAM General appearance: alert, cooperative and mild distress Resp: clear to auscultation bilaterally Cardio: irregularly irregular rhythm GI: soft, non-tender; bowel sounds normal; no masses,  no organomegaly Extremities: extremities normal, atraumatic, no cyanosis or edema  Lab Results:  Results for orders placed or performed during the hospital encounter of 07/15/15 (from the past 48 hour(s))  Glucose, capillary     Status: Abnormal   Collection Time: 07/16/15  8:46 AM  Result Value Ref Range   Glucose-Capillary 294 (H) 65 - 99 mg/dL   Comment 1 Notify RN    Comment 2 Document in Chart   Glucose, capillary     Status: Abnormal   Collection Time: 07/16/15 11:40 AM  Result Value Ref Range   Glucose-Capillary 182 (H) 65 - 99 mg/dL   Comment 1 Notify RN    Comment 2 Document in Chart   Glucose, capillary     Status: Abnormal   Collection Time: 07/16/15  4:06 PM  Result Value Ref Range   Glucose-Capillary 208 (H) 65 - 99 mg/dL   Comment 1 Notify RN    Comment 2 Document in Chart   Glucose, capillary     Status: Abnormal   Collection Time: 07/16/15  8:47 PM  Result Value Ref Range   Glucose-Capillary 223 (H) 65 - 99 mg/dL   Comment 1 Notify RN    Comment 2 Document in Chart   CBC with Differential/Platelet     Status: Abnormal   Collection Time: 07/17/15  5:23 AM  Result Value Ref Range   WBC 12.4  (H) 4.0 - 10.5 K/uL   RBC 4.32 4.22 - 5.81 MIL/uL   Hemoglobin 10.2 (L) 13.0 - 17.0 g/dL   HCT 32.9 (L) 39.0 - 52.0 %   MCV 76.2 (L) 78.0 - 100.0 fL   MCH 23.6 (L) 26.0 - 34.0 pg   MCHC 31.0 30.0 - 36.0 g/dL   RDW 16.7 (H) 11.5 - 15.5 %   Platelets 224 150 - 400 K/uL   Neutrophils Relative % 86 %   Neutro Abs 10.5 (H) 1.7 - 7.7 K/uL   Lymphocytes Relative 10 %   Lymphs Abs 1.3 0.7 - 4.0 K/uL   Monocytes Relative 4 %   Monocytes Absolute 0.5 0.1 - 1.0 K/uL   Eosinophils Relative 0 %   Eosinophils Absolute 0.0 0.0 - 0.7 K/uL   Basophils Relative 0 %   Basophils Absolute 0.0 0.0 - 0.1 K/uL  Basic metabolic panel     Status: Abnormal   Collection Time: 07/17/15  5:23 AM  Result Value Ref Range   Sodium 137 135 - 145 mmol/L   Potassium 4.7 3.5 - 5.1 mmol/L    Comment: DELTA CHECK NOTED   Chloride 102 101 - 111 mmol/L   CO2 24 22 - 32 mmol/L   Glucose, Bld 219 (H) 65 - 99 mg/dL   BUN 22 (  H) 6 - 20 mg/dL   Creatinine, Ser 0.95 0.61 - 1.24 mg/dL   Calcium 8.7 (L) 8.9 - 10.3 mg/dL   GFR calc non Af Amer >60 >60 mL/min   GFR calc Af Amer >60 >60 mL/min    Comment: (NOTE) The eGFR has been calculated using the CKD EPI equation. This calculation has not been validated in all clinical situations. eGFR's persistently <60 mL/min signify possible Chronic Kidney Disease.    Anion gap 11 5 - 15  Glucose, capillary     Status: Abnormal   Collection Time: 07/17/15  7:16 AM  Result Value Ref Range   Glucose-Capillary 203 (H) 65 - 99 mg/dL   Comment 1 Notify RN    Comment 2 Document in Chart   Glucose, capillary     Status: Abnormal   Collection Time: 07/17/15 11:39 AM  Result Value Ref Range   Glucose-Capillary 206 (H) 65 - 99 mg/dL   Comment 1 Notify RN    Comment 2 Document in Chart   Glucose, capillary     Status: Abnormal   Collection Time: 07/17/15  4:36 PM  Result Value Ref Range   Glucose-Capillary 239 (H) 65 - 99 mg/dL   Comment 1 Notify RN    Comment 2 Document in Chart    Glucose, capillary     Status: Abnormal   Collection Time: 07/17/15 10:04 PM  Result Value Ref Range   Glucose-Capillary 269 (H) 65 - 99 mg/dL  Glucose, capillary     Status: Abnormal   Collection Time: 07/18/15  7:47 AM  Result Value Ref Range   Glucose-Capillary 274 (H) 65 - 99 mg/dL    ABGS  Recent Labs  07/15/15 1751  PHART 7.438  PO2ART 76.3*  HCO3 25.1*   CULTURES No results found for this or any previous visit (from the past 240 hour(s)). Studies/Results: No results found.  Medications:  Prior to Admission:  Prescriptions prior to admission  Medication Sig Dispense Refill Last Dose  . albuterol (VENTOLIN HFA) 108 (90 BASE) MCG/ACT inhaler Inhale 2 puffs into the lungs every 6 (six) hours as needed for wheezing or shortness of breath.   Past Week  . benzonatate (TESSALON) 100 MG capsule Take 1 capsule (100 mg total) by mouth 3 (three) times daily as needed for cough. 30 capsule 0 07/15/2015  . budesonide-formoterol (SYMBICORT) 160-4.5 MCG/ACT inhaler Inhale 2 puffs into the lungs 2 (two) times daily.   07/14/2015  . Dextromethorphan-Guaifenesin 20-400 MG TABS Take 1 tablet by mouth every 6 (six) hours as needed (mucus).   Past Week  . diltiazem (CARDIZEM CD) 120 MG 24 hr capsule Take 1 capsule (120 mg total) by mouth daily. 30 capsule 11 07/15/2015  . fluticasone (FLONASE) 50 MCG/ACT nasal spray Place 2 sprays into both nostrils daily.   07/14/2015  . LORazepam (ATIVAN) 0.5 MG tablet Take 1 tablet (0.5 mg total) by mouth 2 (two) times daily as needed for anxiety or sleep. 20 tablet 0 07/15/2015  . metFORMIN (GLUCOPHAGE) 500 MG tablet Take 1 tablet (500 mg total) by mouth daily with breakfast. 30 tablet 3 07/15/2015  . montelukast (SINGULAIR) 10 MG tablet Take 10 mg by mouth at bedtime.   07/14/2015  . oxyCODONE-acetaminophen (PERCOCET) 10-325 MG tablet Take 1 tablet by mouth every 4 (four) hours as needed for pain.   07/14/2015  . oxyCODONE-acetaminophen (ROXICET) 5-325 MG per  tablet Take 1 tablet by mouth every 4 (four) hours as needed for severe pain. 25 tablet  0 07/14/2015  . pantoprazole (PROTONIX) 40 MG tablet Take 1 tablet (40 mg total) by mouth 2 (two) times daily. 60 tablet 0 07/15/2015  . promethazine-dextromethorphan (PROMETHAZINE-DM) 6.25-15 MG/5ML syrup Take 5 mLs by mouth 4 (four) times daily as needed for cough.   07/14/2015  . SPIRIVA RESPIMAT 2.5 MCG/ACT AERS Inhale 1 puff into the lungs daily.   07/15/2015  . baclofen (LIORESAL) 10 MG tablet Take 10 mg by mouth 3 (three) times daily as needed.   Unknown  . edoxaban (SAVAYSA) 60 MG TABS tablet Take 60 mg by mouth daily. 30 tablet 11 Couple Weeks  . levalbuterol (XOPENEX) 0.63 MG/3ML nebulizer solution Take 3 mLs (0.63 mg total) by nebulization 3 (three) times daily. (Patient taking differently: Take 0.63 mg by nebulization every 8 (eight) hours as needed for wheezing or shortness of breath. ) 3 mL 12 07/12/2015  . nicotine (NICODERM CQ - DOSED IN MG/24 HOURS) 21 mg/24hr patch Place 1 patch (21 mg total) onto the skin daily. 28 patch 0 Taking  . nitroGLYCERIN (NITROSTAT) 0.4 MG SL tablet Place 1 tablet (0.4 mg total) under the tongue every 5 (five) minutes as needed for chest pain. 25 tablet 4 Never  . predniSONE (DELTASONE) 20 MG tablet Take 2 tablets (40 mg total) by mouth daily. 10 tablet 0    Scheduled: . antiseptic oral rinse  7 mL Mouth Rinse BID  . azithromycin  250 mg Oral Daily  . benzonatate  100 mg Oral TID  . diltiazem  120 mg Oral Daily  . edoxaban  60 mg Oral Daily  . fluticasone  2 spray Each Nare Daily  . guaiFENesin  1,200 mg Oral BID  . insulin aspart  0-9 Units Subcutaneous TID WC  . insulin glargine  7 Units Subcutaneous Daily  . ipratropium  0.5 mg Nebulization Q6H  . levalbuterol  1.25 mg Nebulization Q6H  . methylPREDNISolone (SOLU-MEDROL) injection  125 mg Intravenous Q6H  . montelukast  10 mg Oral QHS  . nicotine  14 mg Transdermal Daily  . pantoprazole  40 mg Oral BID  .  sodium chloride  3 mL Intravenous Q12H  . sodium chloride  3 mL Intravenous Q12H   Continuous:  TMH:DQQIWL chloride, HYDROcodone-homatropine, levalbuterol, LORazepam, menthol-cetylpyridinium, nitroGLYCERIN, ondansetron **OR** ondansetron (ZOFRAN) IV, oxyCODONE-acetaminophen, sodium chloride  Assesment: He has COPD exacerbation and acute on chronic respiratory failure. He is slowly improving. His blood sugar is elevated probably related to his acute illness and steroids Principal Problem:   Decompensated COPD with exacerbation (chronic obstructive pulmonary disease) (HCC) Active Problems:   Hyperlipidemia   Type 2 diabetes mellitus without complication (HCC)   Cavitary lesion of lung   Acute on chronic respiratory failure (HCC)   COPD exacerbation ([place])    Plan: Continue treatments. Change his insulin dosing. Continue other medications. He is not ready for discharge. My anticipation is that he will be ready for discharge on 07/20/2015    LOS: 3 days   Artrice Kraker L 07/18/2015, 8:36 AM

## 2015-07-19 LAB — GLUCOSE, CAPILLARY
GLUCOSE-CAPILLARY: 271 mg/dL — AB (ref 65–99)
GLUCOSE-CAPILLARY: 281 mg/dL — AB (ref 65–99)
GLUCOSE-CAPILLARY: 345 mg/dL — AB (ref 65–99)
Glucose-Capillary: 341 mg/dL — ABNORMAL HIGH (ref 65–99)
Glucose-Capillary: 300 mg/dL — ABNORMAL HIGH (ref 65–99)

## 2015-07-19 MED ORDER — LORAZEPAM 0.5 MG PO TABS
0.5000 mg | ORAL_TABLET | ORAL | Status: DC
  Administered 2015-07-19 – 2015-07-24 (×12): 0.5 mg via ORAL
  Filled 2015-07-19 (×12): qty 1

## 2015-07-19 MED ORDER — FUROSEMIDE 10 MG/ML IJ SOLN
40.0000 mg | Freq: Every day | INTRAMUSCULAR | Status: DC
  Administered 2015-07-19 – 2015-07-24 (×6): 40 mg via INTRAVENOUS
  Filled 2015-07-19 (×6): qty 4

## 2015-07-19 MED ORDER — LORAZEPAM 0.5 MG PO TABS: 0.5000 mg | ORAL_TABLET | Freq: Two times a day (BID) | ORAL | Status: DC

## 2015-07-19 NOTE — Progress Notes (Signed)
Inpatient Diabetes Program Recommendations  AACE/ADA: New Consensus Statement on Inpatient Glycemic Control (2015)  Target Ranges:  Prepandial:   less than 140 mg/dL      Peak postprandial:   less than 180 mg/dL (1-2 hours)      Critically ill patients:  140 - 180 mg/dL   Review of Glycemic Control Results for Ryan [name], Ryan [name] (MRN [id number]) as of 07/19/2015 13:03  Ref. Range 07/18/2015 21:11 07/19/2015 08:13 07/19/2015 11:21  Glucose-Capillary Latest Ref Range: 65-99 mg/dL 304 (H) 341 (H) 300 (H)     Inpatient Diabetes Program Recommendations:While on steroids please consider increasing Lantus to 15 units daily and adding meal coverage 3 units tid with meals.  As steroids are decreased insulin will need to be adjusted.  [place], CDE. M.Ed. Pager [phone] Inpatient Diabetes Coordinator

## 2015-07-19 NOTE — Care Management (Signed)
Cardiology office contacted because has ran out of Sav[place]a samples and pt is unable to afford. Pt has completed financial assistance application and has not gotten results. Cardiology office asks that the pt come pick up samples when [place]. Pt will ask wife to go get them today. DC not anticipated in next 48 hours.

## 2015-07-19 NOTE — Progress Notes (Signed)
Subjective: He is still coughing and still very short of breath. He has no other new complaints. He still coughing up sputum. He has noticed some swelling of his legs. He is having more trouble with anxiety.  Objective: Vital signs in last 24 hours: Temp:  [97.5 F (36.4 C)-97.9 F (36.6 C)] 97.7 F (36.5 C) (01/05 0600) Pulse Rate:  [79-98] 89 (01/05 0825) Resp:  [20-22] 22 (01/05 0825) BP: (120-138)/(67-79) 127/79 mmHg (01/05 0600) SpO2:  [92 %-96 %] 93 % (01/05 0825) Weight:  [69.809 kg (153 lb 14.4 oz)] 69.809 kg (153 lb 14.4 oz) (01/05 0500) Weight change:  Last BM Date: 07/16/15  Intake/Output from previous day: 01/04 0701 - 01/05 0700 In: 720 [P.O.:720] Out: 1350 [Urine:1350]  PHYSICAL EXAM General appearance: alert, cooperative and moderate distress Resp: rhonchi bilaterally Cardio: irregularly irregular rhythm GI: soft, non-tender; bowel sounds normal; no masses,  no organomegaly Extremities: He has 1-2+ edema bilaterally  Lab Results:  Results for orders placed or performed during the hospital encounter of 07/15/15 (from the past 48 hour(s))  Glucose, capillary     Status: Abnormal   Collection Time: 07/17/15 11:39 AM  Result Value Ref Range   Glucose-Capillary 206 (H) 65 - 99 mg/dL   Comment 1 Notify RN    Comment 2 Document in Chart   Glucose, capillary     Status: Abnormal   Collection Time: 07/17/15  4:36 PM  Result Value Ref Range   Glucose-Capillary 239 (H) 65 - 99 mg/dL   Comment 1 Notify RN    Comment 2 Document in Chart   Glucose, capillary     Status: Abnormal   Collection Time: 07/17/15 10:04 PM  Result Value Ref Range   Glucose-Capillary 269 (H) 65 - 99 mg/dL  Glucose, capillary     Status: Abnormal   Collection Time: 07/18/15  7:47 AM  Result Value Ref Range   Glucose-Capillary 274 (H) 65 - 99 mg/dL  Glucose, capillary     Status: Abnormal   Collection Time: 07/18/15 12:16 PM  Result Value Ref Range   Glucose-Capillary 305 (H) 65 - 99  mg/dL  Glucose, capillary     Status: Abnormal   Collection Time: 07/18/15  3:55 PM  Result Value Ref Range   Glucose-Capillary 194 (H) 65 - 99 mg/dL   Comment 1 Notify RN    Comment 2 Document in Chart   Glucose, capillary     Status: Abnormal   Collection Time: 07/18/15  9:11 PM  Result Value Ref Range   Glucose-Capillary 304 (H) 65 - 99 mg/dL   Comment 1 Notify RN    Comment 2 Document in Chart   Glucose, capillary     Status: Abnormal   Collection Time: 07/19/15  8:13 AM  Result Value Ref Range   Glucose-Capillary 341 (H) 65 - 99 mg/dL    ABGS No results for input(s): PHART, PO2ART, TCO2, HCO3 in the last 72 hours.  Invalid input(s): PCO2 CULTURES No results found for this or any previous visit (from the past 240 hour(s)). Studies/Results: No results found.  Medications:  Prior to Admission:  Prescriptions prior to admission  Medication Sig Dispense Refill Last Dose  . albuterol (VENTOLIN HFA) 108 (90 BASE) MCG/ACT inhaler Inhale 2 puffs into the lungs every 6 (six) hours as needed for wheezing or shortness of breath.   Past Week  . benzonatate (TESSALON) 100 MG capsule Take 1 capsule (100 mg total) by mouth 3 (three) times daily as needed for  cough. 30 capsule 0 07/15/2015  . budesonide-formoterol (SYMBICORT) 160-4.5 MCG/ACT inhaler Inhale 2 puffs into the lungs 2 (two) times daily.   07/14/2015  . Dextromethorphan-Guaifenesin 20-400 MG TABS Take 1 tablet by mouth every 6 (six) hours as needed (mucus).   Past Week  . diltiazem (CARDIZEM CD) 120 MG 24 hr capsule Take 1 capsule (120 mg total) by mouth daily. 30 capsule 11 07/15/2015  . fluticasone (FLONASE) 50 MCG/ACT nasal spray Place 2 sprays into both nostrils daily.   07/14/2015  . LORazepam (ATIVAN) 0.5 MG tablet Take 1 tablet (0.5 mg total) by mouth 2 (two) times daily as needed for anxiety or sleep. 20 tablet 0 07/15/2015  . metFORMIN (GLUCOPHAGE) 500 MG tablet Take 1 tablet (500 mg total) by mouth daily with breakfast. 30  tablet 3 07/15/2015  . montelukast (SINGULAIR) 10 MG tablet Take 10 mg by mouth at bedtime.   07/14/2015  . oxyCODONE-acetaminophen (PERCOCET) 10-325 MG tablet Take 1 tablet by mouth every 4 (four) hours as needed for pain.   07/14/2015  . oxyCODONE-acetaminophen (ROXICET) 5-325 MG per tablet Take 1 tablet by mouth every 4 (four) hours as needed for severe pain. 25 tablet 0 07/14/2015  . pantoprazole (PROTONIX) 40 MG tablet Take 1 tablet (40 mg total) by mouth 2 (two) times daily. 60 tablet 0 07/15/2015  . promethazine-dextromethorphan (PROMETHAZINE-DM) 6.25-15 MG/5ML syrup Take 5 mLs by mouth 4 (four) times daily as needed for cough.   07/14/2015  . SPIRIVA RESPIMAT 2.5 MCG/ACT AERS Inhale 1 puff into the lungs daily.   07/15/2015  . baclofen (LIORESAL) 10 MG tablet Take 10 mg by mouth 3 (three) times daily as needed.   Unknown  . edoxaban (SAVAYSA) 60 MG TABS tablet Take 60 mg by mouth daily. 30 tablet 11 Couple Weeks  . levalbuterol (XOPENEX) 0.63 MG/3ML nebulizer solution Take 3 mLs (0.63 mg total) by nebulization 3 (three) times daily. (Patient taking differently: Take 0.63 mg by nebulization every 8 (eight) hours as needed for wheezing or shortness of breath. ) 3 mL 12 07/12/2015  . nicotine (NICODERM CQ - DOSED IN MG/24 HOURS) 21 mg/24hr patch Place 1 patch (21 mg total) onto the skin daily. 28 patch 0 Taking  . nitroGLYCERIN (NITROSTAT) 0.4 MG SL tablet Place 1 tablet (0.4 mg total) under the tongue every 5 (five) minutes as needed for chest pain. 25 tablet 4 Never  . predniSONE (DELTASONE) 20 MG tablet Take 2 tablets (40 mg total) by mouth daily. 10 tablet 0    Scheduled: . antiseptic oral rinse  7 mL Mouth Rinse BID  . azithromycin  250 mg Oral Daily  . benzonatate  100 mg Oral TID  . diltiazem  120 mg Oral Daily  . edoxaban  60 mg Oral Daily  . fluticasone  2 spray Each Nare Daily  . furosemide  40 mg Intravenous Daily  . guaiFENesin  1,200 mg Oral BID  . insulin aspart  0-15 Units  Subcutaneous TID WC  . insulin aspart  0-5 Units Subcutaneous QHS  . insulin glargine  12 Units Subcutaneous Daily  . ipratropium  0.5 mg Nebulization Q6H  . levalbuterol  1.25 mg Nebulization Q6H  . LORazepam  0.5 mg Oral 2 times per day  . methylPREDNISolone (SOLU-MEDROL) injection  125 mg Intravenous Q6H  . montelukast  10 mg Oral QHS  . nicotine  14 mg Transdermal Daily  . pantoprazole  40 mg Oral BID  . sodium chloride  3 mL Intravenous Q12H  .  sodium chloride  3 mL Intravenous Q12H   Continuous:  LPF:XTKWIO chloride, HYDROcodone-homatropine, levalbuterol, menthol-cetylpyridinium, nitroGLYCERIN, ondansetron **OR** ondansetron (ZOFRAN) IV, oxyCODONE-acetaminophen, sodium chloride  Assesment: He has COPD exacerbation and acute on chronic respiratory failure. He is improving but slowly.  He has chronic atrial fibrillation on chronic anticoagulation but has not actually been able to afford his anticoagulant.   he has some volume overload now.  He has more trouble with anxiety   Principal Problem:   Decompensated COPD with exacerbation (chronic obstructive pulmonary disease) (HCC) Active Problems:   Hyperlipidemia   Type 2 diabetes mellitus without complication (HCC)   Cavitary lesion of lung   Acute on chronic respiratory failure (HCC)   COPD exacerbation ([place])    Plan:He's not ready for discharge. He is still very congested coughing and coughing up a lot of sputum and short of breath with minimal exertion. He has some fluid down on going to add Lasix. I changed the way he is getting his anxiety medications.    LOS: 4 days   Markees Carns L 07/19/2015, 8:48 AM

## 2015-07-20 LAB — GLUCOSE, CAPILLARY
GLUCOSE-CAPILLARY: 470 mg/dL — AB (ref 65–99)
GLUCOSE-CAPILLARY: 109 mg/dL — AB (ref 65–99)
Glucose-Capillary: 477 mg/dL — ABNORMAL HIGH (ref 65–99)
Glucose-Capillary: 398 mg/dL — ABNORMAL HIGH (ref 65–99)
Glucose-Capillary: 448 mg/dL — ABNORMAL HIGH (ref 65–99)

## 2015-07-20 MED ORDER — INSULIN GLARGINE 100 UNIT/ML ~~LOC~~ SOLN
20.0000 [IU] | Freq: Every day | SUBCUTANEOUS | Status: DC
  Administered 2015-07-21: 20 [IU] via SUBCUTANEOUS
  Filled 2015-07-20 (×3): qty 0.2

## 2015-07-20 MED ORDER — LEVALBUTEROL HCL 1.25 MG/0.5ML IN NEBU
1.2500 mg | INHALATION_SOLUTION | Freq: Four times a day (QID) | RESPIRATORY_TRACT | Status: DC
  Administered 2015-07-20: 1.25 mg via RESPIRATORY_TRACT
  Filled 2015-07-20: qty 0.5

## 2015-07-20 MED ORDER — LEVALBUTEROL HCL 1.25 MG/0.5ML IN NEBU
1.2500 mg | INHALATION_SOLUTION | Freq: Four times a day (QID) | RESPIRATORY_TRACT | Status: DC
  Administered 2015-07-21 – 2015-07-25 (×16): 1.25 mg via RESPIRATORY_TRACT
  Filled 2015-07-20 (×16): qty 0.5

## 2015-07-20 MED ORDER — INSULIN ASPART 100 UNIT/ML ~~LOC~~ SOLN
25.0000 [IU] | Freq: Once | SUBCUTANEOUS | Status: AC
  Administered 2015-07-20: 25 [IU] via SUBCUTANEOUS

## 2015-07-20 MED ORDER — INSULIN ASPART 100 UNIT/ML ~~LOC~~ SOLN
0.0000 [IU] | Freq: Three times a day (TID) | SUBCUTANEOUS | Status: DC
  Administered 2015-07-20: 20 [IU] via SUBCUTANEOUS
  Administered 2015-07-21: 11 [IU] via SUBCUTANEOUS
  Administered 2015-07-22: 4 [IU] via SUBCUTANEOUS
  Administered 2015-07-22: 20 [IU] via SUBCUTANEOUS
  Administered 2015-07-23: 7 [IU] via SUBCUTANEOUS
  Administered 2015-07-23: 4 [IU] via SUBCUTANEOUS
  Administered 2015-07-23: 7 [IU] via SUBCUTANEOUS
  Administered 2015-07-24: 11 [IU] via SUBCUTANEOUS
  Administered 2015-07-24: 3 [IU] via SUBCUTANEOUS
  Administered 2015-07-24: 15 [IU] via SUBCUTANEOUS
  Administered 2015-07-25: 3 [IU] via SUBCUTANEOUS
  Administered 2015-07-26 (×2): 7 [IU] via SUBCUTANEOUS
  Administered 2015-07-27: 20 [IU] via SUBCUTANEOUS
  Administered 2015-07-27: 7 [IU] via SUBCUTANEOUS
  Administered 2015-07-27 – 2015-07-28 (×2): 4 [IU] via SUBCUTANEOUS
  Administered 2015-07-28: 20 [IU] via SUBCUTANEOUS
  Administered 2015-07-28: 11 [IU] via SUBCUTANEOUS
  Administered 2015-07-29: 7 [IU] via SUBCUTANEOUS
  Administered 2015-07-29 – 2015-07-30 (×3): 20 [IU] via SUBCUTANEOUS
  Administered 2015-07-30: 11 [IU] via SUBCUTANEOUS
  Administered 2015-07-30: 15 [IU] via SUBCUTANEOUS
  Administered 2015-07-31: 4 [IU] via SUBCUTANEOUS
  Administered 2015-07-31: 11 [IU] via SUBCUTANEOUS
  Administered 2015-07-31 – 2015-08-01 (×2): 20 [IU] via SUBCUTANEOUS
  Administered 2015-08-01: 4 [IU] via SUBCUTANEOUS

## 2015-07-20 MED ORDER — INSULIN ASPART 100 UNIT/ML ~~LOC~~ SOLN
0.0000 [IU] | Freq: Every day | SUBCUTANEOUS | Status: DC
  Administered 2015-07-24: 2 [IU] via SUBCUTANEOUS
  Administered 2015-07-27 (×2): 4 [IU] via SUBCUTANEOUS
  Administered 2015-07-28: 5 [IU] via SUBCUTANEOUS
  Administered 2015-07-29 – 2015-07-30 (×2): 4 [IU] via SUBCUTANEOUS
  Administered 2015-07-31: 3 [IU] via SUBCUTANEOUS

## 2015-07-20 MED ORDER — INSULIN ASPART 100 UNIT/ML ~~LOC~~ SOLN
8.0000 [IU] | Freq: Once | SUBCUTANEOUS | Status: AC
  Administered 2015-07-20: 8 [IU] via SUBCUTANEOUS

## 2015-07-20 NOTE — Progress Notes (Signed)
Subjective: He says he's still having a lot of cough and congestion. He is more short of breath than usual. He is coughing up sputum. He has a little bit less edema of his legs  Objective: Vital signs in last 24 hours: Temp:  [97.7 F (36.5 C)-98 F (36.7 C)] 98 F (36.7 C) (01/06 0519) Pulse Rate:  [86-94] 94 (01/06 0744) Resp:  [17-20] 17 (01/06 0744) BP: (122-128)/(63-82) 122/73 mmHg (01/06 0519) SpO2:  [91 %-98 %] 94 % (01/06 0744) Weight:  [70.398 kg (155 lb 3.2 oz)] 70.398 kg (155 lb 3.2 oz) (01/06 0519) Weight change: 0.59 kg (1 lb 4.8 oz) Last BM Date: 07/16/15  Intake/Output from previous day: 01/05 0701 - 01/06 0700 In: 480 [P.O.:480] Out: 2450 [Urine:2450]  PHYSICAL EXAM General appearance: alert, cooperative and moderate distress Resp: rhonchi bilaterally Cardio: irregularly irregular rhythm GI: soft, non-tender; bowel sounds normal; no masses,  no organomegaly Extremities: 1+ edema  Lab Results:  Results for orders placed or performed during the hospital encounter of 07/15/15 (from the past 48 hour(s))  Glucose, capillary     Status: Abnormal   Collection Time: 07/18/15 12:16 PM  Result Value Ref Range   Glucose-Capillary 305 (H) 65 - 99 mg/dL  Glucose, capillary     Status: Abnormal   Collection Time: 07/18/15  3:55 PM  Result Value Ref Range   Glucose-Capillary 194 (H) 65 - 99 mg/dL   Comment 1 Notify RN    Comment 2 Document in Chart   Glucose, capillary     Status: Abnormal   Collection Time: 07/18/15  9:11 PM  Result Value Ref Range   Glucose-Capillary 304 (H) 65 - 99 mg/dL   Comment 1 Notify RN    Comment 2 Document in Chart   Glucose, capillary     Status: Abnormal   Collection Time: 07/19/15  8:13 AM  Result Value Ref Range   Glucose-Capillary 341 (H) 65 - 99 mg/dL  Glucose, capillary     Status: Abnormal   Collection Time: 07/19/15 11:21 AM  Result Value Ref Range   Glucose-Capillary 300 (H) 65 - 99 mg/dL  Glucose, capillary     Status:  Abnormal   Collection Time: 07/19/15  3:48 PM  Result Value Ref Range   Glucose-Capillary 271 (H) 65 - 99 mg/dL   Comment 1 Notify RN    Comment 2 Document in Chart   Glucose, capillary     Status: Abnormal   Collection Time: 07/19/15  5:19 PM  Result Value Ref Range   Glucose-Capillary 281 (H) 65 - 99 mg/dL   Comment 1 Notify RN    Comment 2 Document in Chart   Glucose, capillary     Status: Abnormal   Collection Time: 07/19/15  8:57 PM  Result Value Ref Range   Glucose-Capillary 345 (H) 65 - 99 mg/dL   Comment 1 Notify RN    Comment 2 Document in Chart   Glucose, capillary     Status: Abnormal   Collection Time: 07/20/15  7:28 AM  Result Value Ref Range   Glucose-Capillary 470 (H) 65 - 99 mg/dL   Comment 1 Notify RN    Comment 2 Document in Chart     ABGS No results for input(s): PHART, PO2ART, TCO2, HCO3 in the last 72 hours.  Invalid input(s): PCO2 CULTURES No results found for this or any previous visit (from the past 240 hour(s)). Studies/Results: No results found.  Medications:  Prior to Admission:  Prescriptions prior to  admission  Medication Sig Dispense Refill Last Dose  . albuterol (VENTOLIN HFA) 108 (90 BASE) MCG/ACT inhaler Inhale 2 puffs into the lungs every 6 (six) hours as needed for wheezing or shortness of breath.   Past Week  . benzonatate (TESSALON) 100 MG capsule Take 1 capsule (100 mg total) by mouth 3 (three) times daily as needed for cough. 30 capsule 0 07/15/2015  . budesonide-formoterol (SYMBICORT) 160-4.5 MCG/ACT inhaler Inhale 2 puffs into the lungs 2 (two) times daily.   07/14/2015  . Dextromethorphan-Guaifenesin 20-400 MG TABS Take 1 tablet by mouth every 6 (six) hours as needed (mucus).   Past Week  . diltiazem (CARDIZEM CD) 120 MG 24 hr capsule Take 1 capsule (120 mg total) by mouth daily. 30 capsule 11 07/15/2015  . fluticasone (FLONASE) 50 MCG/ACT nasal spray Place 2 sprays into both nostrils daily.   07/14/2015  . LORazepam (ATIVAN) 0.5 MG  tablet Take 1 tablet (0.5 mg total) by mouth 2 (two) times daily as needed for anxiety or sleep. 20 tablet 0 07/15/2015  . metFORMIN (GLUCOPHAGE) 500 MG tablet Take 1 tablet (500 mg total) by mouth daily with breakfast. 30 tablet 3 07/15/2015  . montelukast (SINGULAIR) 10 MG tablet Take 10 mg by mouth at bedtime.   07/14/2015  . oxyCODONE-acetaminophen (PERCOCET) 10-325 MG tablet Take 1 tablet by mouth every 4 (four) hours as needed for pain.   07/14/2015  . oxyCODONE-acetaminophen (ROXICET) 5-325 MG per tablet Take 1 tablet by mouth every 4 (four) hours as needed for severe pain. 25 tablet 0 07/14/2015  . pantoprazole (PROTONIX) 40 MG tablet Take 1 tablet (40 mg total) by mouth 2 (two) times daily. 60 tablet 0 07/15/2015  . promethazine-dextromethorphan (PROMETHAZINE-DM) 6.25-15 MG/5ML syrup Take 5 mLs by mouth 4 (four) times daily as needed for cough.   07/14/2015  . SPIRIVA RESPIMAT 2.5 MCG/ACT AERS Inhale 1 puff into the lungs daily.   07/15/2015  . baclofen (LIORESAL) 10 MG tablet Take 10 mg by mouth 3 (three) times daily as needed.   Unknown  . edoxaban (SAVAYSA) 60 MG TABS tablet Take 60 mg by mouth daily. 30 tablet 11 Couple Weeks  . levalbuterol (XOPENEX) 0.63 MG/3ML nebulizer solution Take 3 mLs (0.63 mg total) by nebulization 3 (three) times daily. (Patient taking differently: Take 0.63 mg by nebulization every 8 (eight) hours as needed for wheezing or shortness of breath. ) 3 mL 12 07/12/2015  . nicotine (NICODERM CQ - DOSED IN MG/24 HOURS) 21 mg/24hr patch Place 1 patch (21 mg total) onto the skin daily. 28 patch 0 Taking  . nitroGLYCERIN (NITROSTAT) 0.4 MG SL tablet Place 1 tablet (0.4 mg total) under the tongue every 5 (five) minutes as needed for chest pain. 25 tablet 4 Never  . predniSONE (DELTASONE) 20 MG tablet Take 2 tablets (40 mg total) by mouth daily. 10 tablet 0    Scheduled: . antiseptic oral rinse  7 mL Mouth Rinse BID  . benzonatate  100 mg Oral TID  . diltiazem  120 mg Oral  Daily  . edoxaban  60 mg Oral Daily  . fluticasone  2 spray Each Nare Daily  . furosemide  40 mg Intravenous Daily  . guaiFENesin  1,200 mg Oral BID  . insulin aspart  0-15 Units Subcutaneous TID WC  . insulin aspart  0-5 Units Subcutaneous QHS  . insulin glargine  12 Units Subcutaneous Daily  . ipratropium  0.5 mg Nebulization Q6H  . levalbuterol  1.25 mg Nebulization Q6H  .  LORazepam  0.5 mg Oral 2 times per day  . methylPREDNISolone (SOLU-MEDROL) injection  125 mg Intravenous Q6H  . montelukast  10 mg Oral QHS  . nicotine  14 mg Transdermal Daily  . pantoprazole  40 mg Oral BID  . sodium chloride  3 mL Intravenous Q12H  . sodium chloride  3 mL Intravenous Q12H   Continuous:  ZTA:EWYBRK chloride, HYDROcodone-homatropine, levalbuterol, menthol-cetylpyridinium, nitroGLYCERIN, ondansetron **OR** ondansetron (ZOFRAN) IV, oxyCODONE-acetaminophen, sodium chloride  Assesment: He was admitted with COPD exacerbation. He is improved but it is going very slowly. He is not ready for discharge yet. He has acute on chronic respiratory failure still requiring oxygen. He had some edema and that's better Principal Problem:   Decompensated COPD with exacerbation (chronic obstructive pulmonary disease) (HCC) Active Problems:   Hyperlipidemia   Type 2 diabetes mellitus without complication ([place])   Cavitary lesion of lung   Acute on chronic respiratory failure (HCC)   COPD exacerbation ([place])    Plan: Continue current IV treatments    LOS: 5 days   Christen Bedoya L 07/20/2015, 9:00 AM

## 2015-07-20 NOTE — Progress Notes (Signed)
CRITICAL VALUE ALERT  Critical value received:  CBG = 470   Date of notification:  07/20/15  Time of notification:  0755  Critical value read back:Yes.    Nurse who received alert:  Rosealee Albee  MD notified (1st page):  Dr. Luan Pulling  Time of first page:  0757  MD notified (2nd page):  Time of second page:  Responding MD:  Dr. Luan Pulling   Time MD responded: [phone]

## 2015-07-20 NOTE — Progress Notes (Addendum)
Inpatient Diabetes Program Recommendations  AACE/ADA: New Consensus Statement on Inpatient Glycemic Control (2015)  Target Ranges:  Prepandial:   less than 140 mg/dL      Peak postprandial:   less than 180 mg/dL (1-2 hours)      Critically ill patients:  140 - 180 mg/dL    Results for Ryan [name], Ryan [name] (MRN [id number]) as of 07/20/2015 11:26  Ref. Range 07/19/2015 08:13 07/19/2015 11:21 07/19/2015 15:48 07/19/2015 17:19 07/19/2015 20:57  Glucose-Capillary Latest Ref Range: 65-99 mg/dL 341 (H) 300 (H) 271 (H) 281 (H) 345 (H)    Results for Ryan [name], Ryan [name] (MRN [id number]) as of 07/20/2015 11:26  Ref. Range 07/20/2015 07:28 07/20/2015 11:13  Glucose-Capillary Latest Ref Range: 65-99 mg/dL 470 (H) 477 (H)    Home DM Meds: Metformin 500 mg daily  Current Insulin Orders: Novolog Resistant SSI (0-20 units) TID AC+ HS      Lantus 20 units daily     -Note patient currently getting IV Solumedrol 125 mg Q6 hours.  -Glucose levels 300-400s.  -Note Lantus increased to 20 units daily today.    MD- Please consider the following in-hospital insulin adjustments while patient getting IV steroids:  1. Change Novolog SSI to Q4 hours (currently ordered as TID AC + HS)  2. Start Novolog Meal Coverage- Novolog 4 units tidwc     --Will follow patient during hospitalization--  Wyn Quaker RN, MSN, CDE Diabetes Coordinator Inpatient Glycemic Control Team Team Pager: [phone] (8a-5p)

## 2015-07-20 NOTE — Care Management Important Message (Signed)
Important Message  Patient Details  Name: Ryan [name] MRN: [id number] Date of Birth: Jan 07, 1961   Medicare Important Message Given:  Yes    Joylene Draft, RN 07/20/2015, 8:12 AM

## 2015-07-21 LAB — GLUCOSE, CAPILLARY
GLUCOSE-CAPILLARY: 118 mg/dL — AB (ref 65–99)
GLUCOSE-CAPILLARY: 461 mg/dL — AB (ref 65–99)
Glucose-Capillary: 518 mg/dL — ABNORMAL HIGH (ref 65–99)
Glucose-Capillary: 278 mg/dL — ABNORMAL HIGH (ref 65–99)

## 2015-07-21 MED ORDER — METHYLPREDNISOLONE SODIUM SUCC 40 MG IJ SOLR
40.0000 mg | Freq: Two times a day (BID) | INTRAMUSCULAR | Status: DC
  Administered 2015-07-21 – 2015-07-24 (×6): 40 mg via INTRAVENOUS
  Filled 2015-07-21 (×6): qty 1

## 2015-07-21 MED ORDER — INSULIN ASPART 100 UNIT/ML ~~LOC~~ SOLN
8.0000 [IU] | Freq: Once | SUBCUTANEOUS | Status: AC
  Administered 2015-07-22: 8 [IU] via SUBCUTANEOUS

## 2015-07-21 MED ORDER — POTASSIUM CHLORIDE CRYS ER 20 MEQ PO TBCR
20.0000 meq | EXTENDED_RELEASE_TABLET | Freq: Two times a day (BID) | ORAL | Status: DC
  Administered 2015-07-21 – 2015-07-22 (×4): 20 meq via ORAL
  Filled 2015-07-21 (×6): qty 1

## 2015-07-21 MED ORDER — INSULIN ASPART 100 UNIT/ML ~~LOC~~ SOLN
25.0000 [IU] | Freq: Once | SUBCUTANEOUS | Status: AC
  Administered 2015-07-21: 25 [IU] via SUBCUTANEOUS

## 2015-07-21 MED ORDER — METHOCARBAMOL 500 MG PO TABS
500.0000 mg | ORAL_TABLET | Freq: Three times a day (TID) | ORAL | Status: DC | PRN
  Administered 2015-07-21 – 2015-07-24 (×8): 500 mg via ORAL
  Filled 2015-07-21 (×8): qty 1

## 2015-07-21 MED ORDER — INSULIN GLARGINE 100 UNIT/ML ~~LOC~~ SOLN
30.0000 [IU] | Freq: Every day | SUBCUTANEOUS | Status: DC
  Administered 2015-07-22: 30 [IU] via SUBCUTANEOUS
  Filled 2015-07-21 (×2): qty 0.3

## 2015-07-21 NOTE — Progress Notes (Signed)
New order received from Md for novolog (8 units).[name]

## 2015-07-21 NOTE — Progress Notes (Signed)
CBG 461 - Md notified.  Md responded with phone call that we would cover him.  Continuing to wait for order...  [name]

## 2015-07-21 NOTE — Progress Notes (Signed)
CRITICAL VALUE ALERT  Critical value received:  CBG = 518   Date of notification:  07/21/15  Time of notification:  0815  Critical value read back:Yes.    Nurse who received alert:  Rosealee Albee  MD notified (1st page):  Dr. Luan Pulling  Time of first page:  0820  MD notified (2nd page): Dr. Anastasio Champion   Time of second page:1002  Responding MD:  Dr. Anastasio Champion  Time MD responded:  1005

## 2015-07-21 NOTE — Progress Notes (Signed)
Pt's CBG 448.  Md notified - responded with new order of novolog

## 2015-07-21 NOTE — Progress Notes (Signed)
Subjective: He says he feels better. He has no new complaints. He is still hyperglycemic. He is still coughing.  Objective: Vital signs in last 24 hours: Temp:  [97.6 F (36.4 C)-98.3 F (36.8 C)] 97.6 F (36.4 C) (01/07 0440) Pulse Rate:  [74-106] 74 (01/07 0440) Resp:  [20] 20 (01/07 0440) BP: (116-127)/(65-81) 116/65 mmHg (01/07 0440) SpO2:  [92 %-100 %] 93 % (01/07 0735) Weight:  [71 kg (156 lb 8.4 oz)] 71 kg (156 lb 8.4 oz) (01/07 0440) Weight change: 0.602 kg (1 lb 5.2 oz) Last BM Date: 07/20/15  Intake/Output from previous day: 01/06 0701 - 01/07 0700 In: 726 [P.O.:720; I.V.:6] Out: 1700 [Urine:1700]  PHYSICAL EXAM General appearance: alert, cooperative and mild distress Resp: rhonchi bilaterally Cardio: regular rate and rhythm, S1, S2 normal, no murmur, click, rub or gallop GI: soft, non-tender; bowel sounds normal; no masses,  no organomegaly Extremities: extremities normal, atraumatic, no cyanosis or edema  Lab Results:  Results for orders placed or performed during the hospital encounter of 07/15/15 (from the past 48 hour(s))  Glucose, capillary     Status: Abnormal   Collection Time: 07/19/15  3:48 PM  Result Value Ref Range   Glucose-Capillary 271 (H) 65 - 99 mg/dL   Comment 1 Notify RN    Comment 2 Document in Chart   Glucose, capillary     Status: Abnormal   Collection Time: 07/19/15  5:19 PM  Result Value Ref Range   Glucose-Capillary 281 (H) 65 - 99 mg/dL   Comment 1 Notify RN    Comment 2 Document in Chart   Glucose, capillary     Status: Abnormal   Collection Time: 07/19/15  8:57 PM  Result Value Ref Range   Glucose-Capillary 345 (H) 65 - 99 mg/dL   Comment 1 Notify RN    Comment 2 Document in Chart   Glucose, capillary     Status: Abnormal   Collection Time: 07/20/15  7:28 AM  Result Value Ref Range   Glucose-Capillary 470 (H) 65 - 99 mg/dL   Comment 1 Notify RN    Comment 2 Document in Chart   Glucose, capillary     Status: Abnormal   Collection Time: 07/20/15 11:13 AM  Result Value Ref Range   Glucose-Capillary 477 (H) 65 - 99 mg/dL  Glucose, capillary     Status: Abnormal   Collection Time: 07/20/15 12:50 PM  Result Value Ref Range   Glucose-Capillary 398 (H) 65 - 99 mg/dL   Comment 1 Notify RN    Comment 2 Document in Chart   Glucose, capillary     Status: Abnormal   Collection Time: 07/20/15  4:27 PM  Result Value Ref Range   Glucose-Capillary 109 (H) 65 - 99 mg/dL  Glucose, capillary     Status: Abnormal   Collection Time: 07/20/15  9:33 PM  Result Value Ref Range   Glucose-Capillary 448 (H) 65 - 99 mg/dL  Glucose, capillary     Status: Abnormal   Collection Time: 07/21/15  8:14 AM  Result Value Ref Range   Glucose-Capillary 518 (H) 65 - 99 mg/dL    ABGS No results for input(s): PHART, PO2ART, TCO2, HCO3 in the last 72 hours.  Invalid input(s): PCO2 CULTURES No results found for this or any previous visit (from the past 240 hour(s)). Studies/Results: No results found.  Medications:  Prior to Admission:  Prescriptions prior to admission  Medication Sig Dispense Refill Last Dose  . albuterol (VENTOLIN HFA) 108 (90 BASE)  MCG/ACT inhaler Inhale 2 puffs into the lungs every 6 (six) hours as needed for wheezing or shortness of breath.   Past Week  . benzonatate (TESSALON) 100 MG capsule Take 1 capsule (100 mg total) by mouth 3 (three) times daily as needed for cough. 30 capsule 0 07/15/2015  . budesonide-formoterol (SYMBICORT) 160-4.5 MCG/ACT inhaler Inhale 2 puffs into the lungs 2 (two) times daily.   07/14/2015  . Dextromethorphan-Guaifenesin 20-400 MG TABS Take 1 tablet by mouth every 6 (six) hours as needed (mucus).   Past Week  . diltiazem (CARDIZEM CD) 120 MG 24 hr capsule Take 1 capsule (120 mg total) by mouth daily. 30 capsule 11 07/15/2015  . fluticasone (FLONASE) 50 MCG/ACT nasal spray Place 2 sprays into both nostrils daily.   07/14/2015  . LORazepam (ATIVAN) 0.5 MG tablet Take 1 tablet (0.5 mg  total) by mouth 2 (two) times daily as needed for anxiety or sleep. 20 tablet 0 07/15/2015  . metFORMIN (GLUCOPHAGE) 500 MG tablet Take 1 tablet (500 mg total) by mouth daily with breakfast. 30 tablet 3 07/15/2015  . montelukast (SINGULAIR) 10 MG tablet Take 10 mg by mouth at bedtime.   07/14/2015  . oxyCODONE-acetaminophen (PERCOCET) 10-325 MG tablet Take 1 tablet by mouth every 4 (four) hours as needed for pain.   07/14/2015  . oxyCODONE-acetaminophen (ROXICET) 5-325 MG per tablet Take 1 tablet by mouth every 4 (four) hours as needed for severe pain. 25 tablet 0 07/14/2015  . pantoprazole (PROTONIX) 40 MG tablet Take 1 tablet (40 mg total) by mouth 2 (two) times daily. 60 tablet 0 07/15/2015  . promethazine-dextromethorphan (PROMETHAZINE-DM) 6.25-15 MG/5ML syrup Take 5 mLs by mouth 4 (four) times daily as needed for cough.   07/14/2015  . SPIRIVA RESPIMAT 2.5 MCG/ACT AERS Inhale 1 puff into the lungs daily.   07/15/2015  . baclofen (LIORESAL) 10 MG tablet Take 10 mg by mouth 3 (three) times daily as needed.   Unknown  . edoxaban (SAVAYSA) 60 MG TABS tablet Take 60 mg by mouth daily. 30 tablet 11 Couple Weeks  . levalbuterol (XOPENEX) 0.63 MG/3ML nebulizer solution Take 3 mLs (0.63 mg total) by nebulization 3 (three) times daily. (Patient taking differently: Take 0.63 mg by nebulization every 8 (eight) hours as needed for wheezing or shortness of breath. ) 3 mL 12 07/12/2015  . nicotine (NICODERM CQ - DOSED IN MG/24 HOURS) 21 mg/24hr patch Place 1 patch (21 mg total) onto the skin daily. 28 patch 0 Taking  . nitroGLYCERIN (NITROSTAT) 0.4 MG SL tablet Place 1 tablet (0.4 mg total) under the tongue every 5 (five) minutes as needed for chest pain. 25 tablet 4 Never  . predniSONE (DELTASONE) 20 MG tablet Take 2 tablets (40 mg total) by mouth daily. 10 tablet 0    Scheduled: . antiseptic oral rinse  7 mL Mouth Rinse BID  . benzonatate  100 mg Oral TID  . diltiazem  120 mg Oral Daily  . edoxaban  60 mg Oral  Daily  . fluticasone  2 spray Each Nare Daily  . furosemide  40 mg Intravenous Daily  . guaiFENesin  1,200 mg Oral BID  . insulin aspart  0-20 Units Subcutaneous TID WC  . insulin aspart  0-5 Units Subcutaneous QHS  . [START ON 07/22/2015] insulin glargine  30 Units Subcutaneous Daily  . ipratropium  0.5 mg Nebulization Q6H  . levalbuterol  1.25 mg Nebulization Q6H  . LORazepam  0.5 mg Oral 2 times per day  .  methylPREDNISolone (SOLU-MEDROL) injection  40 mg Intravenous Q12H  . montelukast  10 mg Oral QHS  . nicotine  14 mg Transdermal Daily  . pantoprazole  40 mg Oral BID  . potassium chloride  20 mEq Oral BID  . sodium chloride  3 mL Intravenous Q12H  . sodium chloride  3 mL Intravenous Q12H   Continuous:  OIP:PGFQMK chloride, HYDROcodone-homatropine, levalbuterol, menthol-cetylpyridinium, methocarbamol, nitroGLYCERIN, ondansetron **OR** ondansetron (ZOFRAN) IV, oxyCODONE-acetaminophen, sodium chloride  Assesment: He was admitted with COPD exacerbation and acute on chronic respiratory failure. He is much improved. He is still coughing up sputum. His blood sugar has gone up I think is a combination of acute illness and steroids.  He has atrial fibrillation but his heart seems regular now. He is chronically anticoagulated. Principal Problem:   Decompensated COPD with exacerbation (chronic obstructive pulmonary disease) (HCC) Active Problems:   Hyperlipidemia   Type 2 diabetes mellitus without complication (HCC)   Cavitary lesion of lung   Acute on chronic respiratory failure (HCC)   COPD exacerbation (HCC)    Plan: Reduce his steroids. Increase his insulin. Continue other treatments. He is getting better as far as his breathing is concerned    LOS: 6 days   Mehul Rudin L 07/21/2015, 11:21 AM

## 2015-07-22 LAB — CBC WITH DIFFERENTIAL/PLATELET
Basophils Absolute: 0 10*3/uL (ref 0.0–0.1)
Basophils Relative: 0 %
EOS ABS: 0 10*3/uL (ref 0.0–0.7)
EOS PCT: 0 %
HCT: 35.4 % — ABNORMAL LOW (ref 39.0–52.0)
HEMOGLOBIN: 11.1 g/dL — AB (ref 13.0–17.0)
LYMPHS ABS: 1.6 10*3/uL (ref 0.7–4.0)
LYMPHS PCT: 11 %
MCH: 23.7 pg — AB (ref 26.0–34.0)
MCHC: 31.4 g/dL (ref 30.0–36.0)
MCV: 75.5 fL — ABNORMAL LOW (ref 78.0–100.0)
MONOS PCT: 8 %
Monocytes Absolute: 1.2 10*3/uL — ABNORMAL HIGH (ref 0.1–1.0)
Neutro Abs: 11.6 10*3/uL — ABNORMAL HIGH (ref 1.7–7.7)
Neutrophils Relative %: 81 %
Platelets: 241 10*3/uL (ref 150–400)
RBC: 4.69 MIL/uL (ref 4.22–5.81)
RDW: 16.7 % — ABNORMAL HIGH (ref 11.5–15.5)
WBC: 14.4 10*3/uL — AB (ref 4.0–10.5)

## 2015-07-22 LAB — GLUCOSE, CAPILLARY
GLUCOSE-CAPILLARY: 352 mg/dL — AB (ref 65–99)
Glucose-Capillary: 375 mg/dL — ABNORMAL HIGH (ref 65–99)
Glucose-Capillary: 161 mg/dL — ABNORMAL HIGH (ref 65–99)
Glucose-Capillary: 441 mg/dL — ABNORMAL HIGH (ref 65–99)

## 2015-07-22 LAB — BASIC METABOLIC PANEL
Anion gap: 10 (ref 5–15)
BUN: 25 mg/dL — AB (ref 6–20)
CHLORIDE: 95 mmol/L — AB (ref 101–111)
CO2: 32 mmol/L (ref 22–32)
CREATININE: 0.98 mg/dL (ref 0.61–1.24)
Calcium: 8.3 mg/dL — ABNORMAL LOW (ref 8.9–10.3)
GFR calc Af Amer: >60 mL/min (ref >60)
GFR calc non Af Amer: >60 mL/min (ref >60)
GLUCOSE: 369 mg/dL — AB (ref 65–99)
POTASSIUM: 3.7 mmol/L (ref 3.5–5.1)
Sodium: 137 mmol/L (ref 135–145)

## 2015-07-22 MED ORDER — INSULIN GLARGINE 100 UNIT/ML ~~LOC~~ SOLN
38.0000 [IU] | Freq: Every day | SUBCUTANEOUS | Status: DC
  Administered 2015-07-23 – 2015-07-24 (×2): 38 [IU] via SUBCUTANEOUS
  Filled 2015-07-22 (×3): qty 0.38

## 2015-07-22 MED ORDER — MORPHINE SULFATE (PF) 4 MG/ML IV SOLN
4.0000 mg | INTRAVENOUS | Status: DC | PRN
  Administered 2015-07-22 – 2015-07-25 (×14): 4 mg via INTRAVENOUS
  Filled 2015-07-22 (×15): qty 1

## 2015-07-22 MED ORDER — INSULIN ASPART 100 UNIT/ML ~~LOC~~ SOLN
28.0000 [IU] | Freq: Once | SUBCUTANEOUS | Status: AC
  Administered 2015-07-22: 28 [IU] via SUBCUTANEOUS

## 2015-07-22 NOTE — Progress Notes (Signed)
Subjective: He's doing a little better as far as his breathing is concerned but is still pretty short of breath at rest. He is having severe pain in his back that seems to be related to muscle spasm from coughing. His blood sugar is still elevated but improving  Objective: Vital signs in last 24 hours: Temp:  [97.6 F (36.4 C)-98 F (36.7 C)] 97.6 F (36.4 C) (01/08 0554) Pulse Rate:  [76-94] 94 (01/08 0734) Resp:  [19-20] 19 (01/08 0734) BP: (124-138)/(67-82) 124/81 mmHg (01/08 0554) SpO2:  [91 %-95 %] 93 % (01/08 0734) Weight:  [72.303 kg (159 lb 6.4 oz)] 72.303 kg (159 lb 6.4 oz) (01/08 0554) Weight change: 1.303 kg (2 lb 14 oz) Last BM Date: 07/21/15  Intake/Output from previous day: 01/07 0701 - 01/08 0700 In: 240 [P.O.:240] Out: 1500 [Urine:1500]  PHYSICAL EXAM General appearance: alert, cooperative and mild distress Resp: rhonchi bilaterally Cardio: irregularly irregular rhythm GI: soft, non-tender; bowel sounds normal; no masses,  no organomegaly Extremities: extremities normal, atraumatic, no cyanosis or edema  Lab Results:  Results for orders placed or performed during the hospital encounter of 07/15/15 (from the past 48 hour(s))  Glucose, capillary     Status: Abnormal   Collection Time: 07/20/15 11:13 AM  Result Value Ref Range   Glucose-Capillary 477 (H) 65 - 99 mg/dL  Glucose, capillary     Status: Abnormal   Collection Time: 07/20/15 12:50 PM  Result Value Ref Range   Glucose-Capillary 398 (H) 65 - 99 mg/dL   Comment 1 Notify RN    Comment 2 Document in Chart   Glucose, capillary     Status: Abnormal   Collection Time: 07/20/15  4:27 PM  Result Value Ref Range   Glucose-Capillary 109 (H) 65 - 99 mg/dL  Glucose, capillary     Status: Abnormal   Collection Time: 07/20/15  9:33 PM  Result Value Ref Range   Glucose-Capillary 448 (H) 65 - 99 mg/dL  Glucose, capillary     Status: Abnormal   Collection Time: 07/21/15  8:14 AM  Result Value Ref Range   Glucose-Capillary 518 (H) 65 - 99 mg/dL  Glucose, capillary     Status: Abnormal   Collection Time: 07/21/15 12:50 PM  Result Value Ref Range   Glucose-Capillary 278 (H) 65 - 99 mg/dL  Glucose, capillary     Status: Abnormal   Collection Time: 07/21/15  4:41 PM  Result Value Ref Range   Glucose-Capillary 118 (H) 65 - 99 mg/dL  Glucose, capillary     Status: Abnormal   Collection Time: 07/21/15  9:57 PM  Result Value Ref Range   Glucose-Capillary 461 (H) 65 - 99 mg/dL  CBC with Differential/Platelet     Status: Abnormal   Collection Time: 07/22/15  6:04 AM  Result Value Ref Range   WBC 14.4 (H) 4.0 - 10.5 K/uL   RBC 4.69 4.22 - 5.81 MIL/uL   Hemoglobin 11.1 (L) 13.0 - 17.0 g/dL   HCT 35.4 (L) 39.0 - 52.0 %   MCV 75.5 (L) 78.0 - 100.0 fL   MCH 23.7 (L) 26.0 - 34.0 pg   MCHC 31.4 30.0 - 36.0 g/dL   RDW 16.7 (H) 11.5 - 15.5 %   Platelets 241 150 - 400 K/uL    Comment: SPECIMEN CHECKED FOR CLOTS PLATELET COUNT CONFIRMED BY SMEAR    Neutrophils Relative % 81 %   Neutro Abs 11.6 (H) 1.7 - 7.7 K/uL   Lymphocytes Relative 11 %  Lymphs Abs 1.6 0.7 - 4.0 K/uL   Monocytes Relative 8 %   Monocytes Absolute 1.2 (H) 0.1 - 1.0 K/uL   Eosinophils Relative 0 %   Eosinophils Absolute 0.0 0.0 - 0.7 K/uL   Basophils Relative 0 %   Basophils Absolute 0.0 0.0 - 0.1 K/uL  Basic metabolic panel     Status: Abnormal   Collection Time: 07/22/15  6:04 AM  Result Value Ref Range   Sodium 137 135 - 145 mmol/L   Potassium 3.7 3.5 - 5.1 mmol/L   Chloride 95 (L) 101 - 111 mmol/L   CO2 32 22 - 32 mmol/L   Glucose, Bld 369 (H) 65 - 99 mg/dL   BUN 25 (H) 6 - 20 mg/dL   Creatinine, Ser 0.98 0.61 - 1.24 mg/dL   Calcium 8.3 (L) 8.9 - 10.3 mg/dL   GFR calc non Af Amer >60 >60 mL/min   GFR calc Af Amer >60 >60 mL/min    Comment: (NOTE) The eGFR has been calculated using the CKD EPI equation. This calculation has not been validated in all clinical situations. eGFR's persistently <60 mL/min signify  possible Chronic Kidney Disease.    Anion gap 10 5 - 15  Glucose, capillary     Status: Abnormal   Collection Time: 07/22/15  8:14 AM  Result Value Ref Range   Glucose-Capillary 375 (H) 65 - 99 mg/dL    ABGS No results for input(s): PHART, PO2ART, TCO2, HCO3 in the last 72 hours.  Invalid input(s): PCO2 CULTURES No results found for this or any previous visit (from the past 240 hour(s)). Studies/Results: No results found.  Medications:  Prior to Admission:  Prescriptions prior to admission  Medication Sig Dispense Refill Last Dose  . albuterol (VENTOLIN HFA) 108 (90 BASE) MCG/ACT inhaler Inhale 2 puffs into the lungs every 6 (six) hours as needed for wheezing or shortness of breath.   Past Week  . benzonatate (TESSALON) 100 MG capsule Take 1 capsule (100 mg total) by mouth 3 (three) times daily as needed for cough. 30 capsule 0 07/15/2015  . budesonide-formoterol (SYMBICORT) 160-4.5 MCG/ACT inhaler Inhale 2 puffs into the lungs 2 (two) times daily.   07/14/2015  . Dextromethorphan-Guaifenesin 20-400 MG TABS Take 1 tablet by mouth every 6 (six) hours as needed (mucus).   Past Week  . diltiazem (CARDIZEM CD) 120 MG 24 hr capsule Take 1 capsule (120 mg total) by mouth daily. 30 capsule 11 07/15/2015  . fluticasone (FLONASE) 50 MCG/ACT nasal spray Place 2 sprays into both nostrils daily.   07/14/2015  . LORazepam (ATIVAN) 0.5 MG tablet Take 1 tablet (0.5 mg total) by mouth 2 (two) times daily as needed for anxiety or sleep. 20 tablet 0 07/15/2015  . metFORMIN (GLUCOPHAGE) 500 MG tablet Take 1 tablet (500 mg total) by mouth daily with breakfast. 30 tablet 3 07/15/2015  . montelukast (SINGULAIR) 10 MG tablet Take 10 mg by mouth at bedtime.   07/14/2015  . oxyCODONE-acetaminophen (PERCOCET) 10-325 MG tablet Take 1 tablet by mouth every 4 (four) hours as needed for pain.   07/14/2015  . oxyCODONE-acetaminophen (ROXICET) 5-325 MG per tablet Take 1 tablet by mouth every 4 (four) hours as needed for  severe pain. 25 tablet 0 07/14/2015  . pantoprazole (PROTONIX) 40 MG tablet Take 1 tablet (40 mg total) by mouth 2 (two) times daily. 60 tablet 0 07/15/2015  . promethazine-dextromethorphan (PROMETHAZINE-DM) 6.25-15 MG/5ML syrup Take 5 mLs by mouth 4 (four) times daily as needed for  cough.   07/14/2015  . SPIRIVA RESPIMAT 2.5 MCG/ACT AERS Inhale 1 puff into the lungs daily.   07/15/2015  . baclofen (LIORESAL) 10 MG tablet Take 10 mg by mouth 3 (three) times daily as needed.   Unknown  . edoxaban (SAVAYSA) 60 MG TABS tablet Take 60 mg by mouth daily. 30 tablet 11 Couple Weeks  . levalbuterol (XOPENEX) 0.63 MG/3ML nebulizer solution Take 3 mLs (0.63 mg total) by nebulization 3 (three) times daily. (Patient taking differently: Take 0.63 mg by nebulization every 8 (eight) hours as needed for wheezing or shortness of breath. ) 3 mL 12 07/12/2015  . nicotine (NICODERM CQ - DOSED IN MG/24 HOURS) 21 mg/24hr patch Place 1 patch (21 mg total) onto the skin daily. 28 patch 0 Taking  . nitroGLYCERIN (NITROSTAT) 0.4 MG SL tablet Place 1 tablet (0.4 mg total) under the tongue every 5 (five) minutes as needed for chest pain. 25 tablet 4 Never  . predniSONE (DELTASONE) 20 MG tablet Take 2 tablets (40 mg total) by mouth daily. 10 tablet 0    Scheduled: . antiseptic oral rinse  7 mL Mouth Rinse BID  . benzonatate  100 mg Oral TID  . diltiazem  120 mg Oral Daily  . edoxaban  60 mg Oral Daily  . fluticasone  2 spray Each Nare Daily  . furosemide  40 mg Intravenous Daily  . guaiFENesin  1,200 mg Oral BID  . insulin aspart  0-20 Units Subcutaneous TID WC  . insulin aspart  0-5 Units Subcutaneous QHS  . insulin glargine  30 Units Subcutaneous Daily  . ipratropium  0.5 mg Nebulization Q6H  . levalbuterol  1.25 mg Nebulization Q6H  . LORazepam  0.5 mg Oral 2 times per day  . methylPREDNISolone (SOLU-MEDROL) injection  40 mg Intravenous Q12H  . montelukast  10 mg Oral QHS  . nicotine  14 mg Transdermal Daily  .  pantoprazole  40 mg Oral BID  . potassium chloride  20 mEq Oral BID  . sodium chloride  3 mL Intravenous Q12H  . sodium chloride  3 mL Intravenous Q12H   Continuous:  TGY:BWLSLH chloride, HYDROcodone-homatropine, levalbuterol, menthol-cetylpyridinium, methocarbamol, morphine injection, nitroGLYCERIN, ondansetron **OR** ondansetron (ZOFRAN) IV, oxyCODONE-acetaminophen, sodium chloride  Assesment: He was admitted with COPD exacerbation and acute on chronic respiratory failure. He has chronic atrial fibrillation on chronic anticoagulation. His blood sugar has been very elevated. He is complaining of severe back pain now. Always better with his COPD exacerbation he is still very congested Principal Problem:   Decompensated COPD with exacerbation (chronic obstructive pulmonary disease) (HCC) Active Problems:   Hyperlipidemia   Type 2 diabetes mellitus without complication (HCC)   Cavitary lesion of lung   Acute on chronic respiratory failure (HCC)   COPD exacerbation ([place])    Plan: Continue current medications. I will add pain medication because his current oral meds did not work    LOS: 7 days   Mckay Tegtmeyer L 07/22/2015, 10:46 AM

## 2015-07-23 LAB — GLUCOSE, CAPILLARY
GLUCOSE-CAPILLARY: 235 mg/dL — AB (ref 65–99)
GLUCOSE-CAPILLARY: 110 mg/dL — AB (ref 65–99)
Glucose-Capillary: 266 mg/dL — ABNORMAL HIGH (ref 65–99)
Glucose-Capillary: 177 mg/dL — ABNORMAL HIGH (ref 65–99)

## 2015-07-23 MED ORDER — POTASSIUM CHLORIDE CRYS ER 20 MEQ PO TBCR
20.0000 meq | EXTENDED_RELEASE_TABLET | Freq: Three times a day (TID) | ORAL | Status: DC
  Administered 2015-07-23 – 2015-07-24 (×4): 20 meq via ORAL
  Filled 2015-07-23 (×3): qty 1

## 2015-07-23 MED ORDER — INSULIN ASPART 100 UNIT/ML ~~LOC~~ SOLN
5.0000 [IU] | Freq: Three times a day (TID) | SUBCUTANEOUS | Status: DC
  Administered 2015-07-23 – 2015-07-24 (×5): 5 [IU] via SUBCUTANEOUS

## 2015-07-23 NOTE — Progress Notes (Signed)
Inpatient Diabetes Program Recommendations  AACE/ADA: New Consensus Statement on Inpatient Glycemic Control (2015)  Target Ranges:  Prepandial:   less than 140 mg/dL      Peak postprandial:   less than 180 mg/dL (1-2 hours)      Critically ill patients:  140 - 180 mg/dL  Results for Ryan [name], Ryan [name] (MRN [id number]) as of 07/23/2015 08:44  Ref. Range 07/22/2015 08:14 07/22/2015 11:13 07/22/2015 16:45 07/22/2015 19:44 07/23/2015 07:59  Glucose-Capillary Latest Ref Range: 65-99 mg/dL 375 (H) 161 (H) 441 (H) 352 (H) 235 (H)   Review of Glycemic Control  Current orders for Inpatient glycemic control: Lantus 38 units daily, Novolog 0-20 units TID with meals, Novolog 0-5 units HS  Inpatient Diabetes Program Recommendations: Insulin - Meal Coverage: If steroids are continued as ordered, please consider ordering Novolog 5 units TID with meals for meal coverage.  Thanks, Barnie Alderman, RN, MSN, CDE Diabetes Coordinator Inpatient Diabetes Program [phone] (Team Pager from [place] to [place]) [phone] ([facility] office) [phone] [facility] office) [phone] [facility] office)

## 2015-07-23 NOTE — Care Management Note (Signed)
Case Management Note  Patient Details  Name: Ryan [name] MRN: [id number] Date of Birth: [birth year]  Expected Discharge Date:     07/26/2015             Expected Discharge Plan:  [place]  In-House Referral:  NA  Discharge planning Services  CM Consult  Post Acute Care Choice:  Home Health Choice offered to:  Patient  DME Arranged:    DME Agency:     HH Arranged:  RN, PT [place] Agency:  [place]  Status of Service:  In process, will continue to follow  Medicare Important Message Given:  Yes Date Medicare IM Given:    Medicare IM give by:    Date Additional Medicare IM Given:    Additional Medicare Important Message give by:     If discussed at [place] of Stay Meetings, dates discussed:    Additional Comments: PT has recommended HH PT at DC. Pt is agreeable and will obtain pt info from chart. Will cont to follow for DC planning.  Sherald Barge, RN 07/23/2015, 2:08 PM
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
## 2015-07-23 NOTE — Progress Notes (Signed)
Subjective: He continues to have a lot of trouble with muscle spasm in the back. His breathing is doing a little better. He has no other new complaints  Objective: Vital signs in last 24 hours: Temp:  [97.4 F (36.3 C)-98.2 F (36.8 C)] 98 F (36.7 C) (01/09 0553) Pulse Rate:  [76-96] 76 (01/09 0553) Resp:  [20] 20 (01/09 0553) BP: (130-143)/(68-74) 130/68 mmHg (01/09 0553) SpO2:  [78 %-93 %] 90 % (01/09 0811) Weight:  [73.029 kg (161 lb)] 73.029 kg (161 lb) (01/09 0553) Weight change: 0.726 kg (1 lb 9.6 oz) Last BM Date: 07/21/15  Intake/Output from previous day: 01/08 0701 - 01/09 0700 In: 3 [I.V.:3] Out: 700 [Urine:700]  PHYSICAL EXAM General appearance: alert, cooperative and mild distress Resp: rhonchi bilaterally Cardio: irregularly irregular rhythm GI: soft, non-tender; bowel sounds normal; no masses,  no organomegaly Extremities: 1+ edema  Lab Results:  Results for orders placed or performed during the hospital encounter of 07/15/15 (from the past 48 hour(s))  Glucose, capillary     Status: Abnormal   Collection Time: 07/21/15 12:50 PM  Result Value Ref Range   Glucose-Capillary 278 (H) 65 - 99 mg/dL  Glucose, capillary     Status: Abnormal   Collection Time: 07/21/15  4:41 PM  Result Value Ref Range   Glucose-Capillary 118 (H) 65 - 99 mg/dL  Glucose, capillary     Status: Abnormal   Collection Time: 07/21/15  9:57 PM  Result Value Ref Range   Glucose-Capillary 461 (H) 65 - 99 mg/dL  CBC with Differential/Platelet     Status: Abnormal   Collection Time: 07/22/15  6:04 AM  Result Value Ref Range   WBC 14.4 (H) 4.0 - 10.5 K/uL   RBC 4.69 4.22 - 5.81 MIL/uL   Hemoglobin 11.1 (L) 13.0 - 17.0 g/dL   HCT 35.4 (L) 39.0 - 52.0 %   MCV 75.5 (L) 78.0 - 100.0 fL   MCH 23.7 (L) 26.0 - 34.0 pg   MCHC 31.4 30.0 - 36.0 g/dL   RDW 16.7 (H) 11.5 - 15.5 %   Platelets 241 150 - 400 K/uL    Comment: SPECIMEN CHECKED FOR CLOTS PLATELET COUNT CONFIRMED BY SMEAR    Neutrophils Relative % 81 %   Neutro Abs 11.6 (H) 1.7 - 7.7 K/uL   Lymphocytes Relative 11 %   Lymphs Abs 1.6 0.7 - 4.0 K/uL   Monocytes Relative 8 %   Monocytes Absolute 1.2 (H) 0.1 - 1.0 K/uL   Eosinophils Relative 0 %   Eosinophils Absolute 0.0 0.0 - 0.7 K/uL   Basophils Relative 0 %   Basophils Absolute 0.0 0.0 - 0.1 K/uL  Basic metabolic panel     Status: Abnormal   Collection Time: 07/22/15  6:04 AM  Result Value Ref Range   Sodium 137 135 - 145 mmol/L   Potassium 3.7 3.5 - 5.1 mmol/L   Chloride 95 (L) 101 - 111 mmol/L   CO2 32 22 - 32 mmol/L   Glucose, Bld 369 (H) 65 - 99 mg/dL   BUN 25 (H) 6 - 20 mg/dL   Creatinine, Ser 0.98 0.61 - 1.24 mg/dL   Calcium 8.3 (L) 8.9 - 10.3 mg/dL   GFR calc non Af Amer >60 >60 mL/min   GFR calc Af Amer >60 >60 mL/min    Comment: (NOTE) The eGFR has been calculated using the CKD EPI equation. This calculation has not been validated in all clinical situations. eGFR's persistently <60 mL/min signify possible Chronic  Kidney Disease.    Anion gap 10 5 - 15  Glucose, capillary     Status: Abnormal   Collection Time: 07/22/15  8:14 AM  Result Value Ref Range   Glucose-Capillary 375 (H) 65 - 99 mg/dL  Glucose, capillary     Status: Abnormal   Collection Time: 07/22/15 11:13 AM  Result Value Ref Range   Glucose-Capillary 161 (H) 65 - 99 mg/dL  Glucose, capillary     Status: Abnormal   Collection Time: 07/22/15  4:45 PM  Result Value Ref Range   Glucose-Capillary 441 (H) 65 - 99 mg/dL  Glucose, capillary     Status: Abnormal   Collection Time: 07/22/15  7:44 PM  Result Value Ref Range   Glucose-Capillary 352 (H) 65 - 99 mg/dL  Glucose, capillary     Status: Abnormal   Collection Time: 07/23/15  7:59 AM  Result Value Ref Range   Glucose-Capillary 235 (H) 65 - 99 mg/dL    ABGS No results for input(s): PHART, PO2ART, TCO2, HCO3 in the last 72 hours.  Invalid input(s): PCO2 CULTURES No results found for this or any previous visit (from  the past 240 hour(s)). Studies/Results: No results found.  Medications:  Prior to Admission:  Prescriptions prior to admission  Medication Sig Dispense Refill Last Dose  . albuterol (VENTOLIN HFA) 108 (90 BASE) MCG/ACT inhaler Inhale 2 puffs into the lungs every 6 (six) hours as needed for wheezing or shortness of breath.   Past Week  . benzonatate (TESSALON) 100 MG capsule Take 1 capsule (100 mg total) by mouth 3 (three) times daily as needed for cough. 30 capsule 0 07/15/2015  . budesonide-formoterol (SYMBICORT) 160-4.5 MCG/ACT inhaler Inhale 2 puffs into the lungs 2 (two) times daily.   07/14/2015  . Dextromethorphan-Guaifenesin 20-400 MG TABS Take 1 tablet by mouth every 6 (six) hours as needed (mucus).   Past Week  . diltiazem (CARDIZEM CD) 120 MG 24 hr capsule Take 1 capsule (120 mg total) by mouth daily. 30 capsule 11 07/15/2015  . fluticasone (FLONASE) 50 MCG/ACT nasal spray Place 2 sprays into both nostrils daily.   07/14/2015  . LORazepam (ATIVAN) 0.5 MG tablet Take 1 tablet (0.5 mg total) by mouth 2 (two) times daily as needed for anxiety or sleep. 20 tablet 0 07/15/2015  . metFORMIN (GLUCOPHAGE) 500 MG tablet Take 1 tablet (500 mg total) by mouth daily with breakfast. 30 tablet 3 07/15/2015  . montelukast (SINGULAIR) 10 MG tablet Take 10 mg by mouth at bedtime.   07/14/2015  . oxyCODONE-acetaminophen (PERCOCET) 10-325 MG tablet Take 1 tablet by mouth every 4 (four) hours as needed for pain.   07/14/2015  . oxyCODONE-acetaminophen (ROXICET) 5-325 MG per tablet Take 1 tablet by mouth every 4 (four) hours as needed for severe pain. 25 tablet 0 07/14/2015  . pantoprazole (PROTONIX) 40 MG tablet Take 1 tablet (40 mg total) by mouth 2 (two) times daily. 60 tablet 0 07/15/2015  . promethazine-dextromethorphan (PROMETHAZINE-DM) 6.25-15 MG/5ML syrup Take 5 mLs by mouth 4 (four) times daily as needed for cough.   07/14/2015  . SPIRIVA RESPIMAT 2.5 MCG/ACT AERS Inhale 1 puff into the lungs daily.    07/15/2015  . baclofen (LIORESAL) 10 MG tablet Take 10 mg by mouth 3 (three) times daily as needed.   Unknown  . edoxaban (SAVAYSA) 60 MG TABS tablet Take 60 mg by mouth daily. 30 tablet 11 Couple Weeks  . levalbuterol (XOPENEX) 0.63 MG/3ML nebulizer solution Take 3 mLs (0.63 mg  total) by nebulization 3 (three) times daily. (Patient taking differently: Take 0.63 mg by nebulization every 8 (eight) hours as needed for wheezing or shortness of breath. ) 3 mL 12 07/12/2015  . nicotine (NICODERM CQ - DOSED IN MG/24 HOURS) 21 mg/24hr patch Place 1 patch (21 mg total) onto the skin daily. 28 patch 0 Taking  . nitroGLYCERIN (NITROSTAT) 0.4 MG SL tablet Place 1 tablet (0.4 mg total) under the tongue every 5 (five) minutes as needed for chest pain. 25 tablet 4 Never  . predniSONE (DELTASONE) 20 MG tablet Take 2 tablets (40 mg total) by mouth daily. 10 tablet 0    Scheduled: . antiseptic oral rinse  7 mL Mouth Rinse BID  . benzonatate  100 mg Oral TID  . diltiazem  120 mg Oral Daily  . edoxaban  60 mg Oral Daily  . fluticasone  2 spray Each Nare Daily  . furosemide  40 mg Intravenous Daily  . guaiFENesin  1,200 mg Oral BID  . insulin aspart  0-20 Units Subcutaneous TID WC  . insulin aspart  0-5 Units Subcutaneous QHS  . insulin glargine  38 Units Subcutaneous Daily  . ipratropium  0.5 mg Nebulization Q6H  . levalbuterol  1.25 mg Nebulization Q6H  . LORazepam  0.5 mg Oral 2 times per day  . methylPREDNISolone (SOLU-MEDROL) injection  40 mg Intravenous Q12H  . montelukast  10 mg Oral QHS  . nicotine  14 mg Transdermal Daily  . pantoprazole  40 mg Oral BID  . potassium chloride  20 mEq Oral TID  . sodium chloride  3 mL Intravenous Q12H  . sodium chloride  3 mL Intravenous Q12H   Continuous:  FMM:CRFVOH chloride, HYDROcodone-homatropine, levalbuterol, menthol-cetylpyridinium, methocarbamol, morphine injection, nitroGLYCERIN, ondansetron **OR** ondansetron (ZOFRAN) IV, oxyCODONE-acetaminophen, sodium  chloride  Assesment: He has COPD with exacerbation and acute on chronic hypoxic respiratory failure. He is slowly improving.  He has chronic atrial fibrillation on chronic anticoagulation.  He's had fluid retention which I think is multifactorial.  He has diabetes and his blood sugar is not controlled despite sliding scale and basal insulin  He's having muscle spasm that I think is related to his cough Principal Problem:   Decompensated COPD with exacerbation (chronic obstructive pulmonary disease) (HCC) Active Problems:   Hyperlipidemia   Type 2 diabetes mellitus without complication (HCC)   Cavitary lesion of lung   Acute on chronic respiratory failure (HCC)   COPD exacerbation ([place])    Plan: Continue treatments. Add mealtime insulin.    LOS: 8 days   Arthella Headings L 07/23/2015, 8:46 AM

## 2015-07-23 NOTE — Plan of Care (Signed)
Problem: Acute Rehab PT Goals(only PT should resolve) Goal: Pt Will Ambulate Pt will ambulate with LRAD at Supervision using 3L/min O2 for a distances greater than 260f maintaining SaO2 >88%to demonstrate the ability to perform safe household distance ambulation at discharge.    Goal: Pt Will Go Up/Down Stairs Pt will ascend/descend 10 stairs with LRAD and 1 HR at Supervision with 3L/min O2 and SaO2>88% to demonstrate safe entry/exit of home.

## 2015-07-24 LAB — GLUCOSE, CAPILLARY
GLUCOSE-CAPILLARY: 211 mg/dL — AB (ref 65–99)
Glucose-Capillary: 315 mg/dL — ABNORMAL HIGH (ref 65–99)
Glucose-Capillary: 300 mg/dL — ABNORMAL HIGH (ref 65–99)
Glucose-Capillary: 136 mg/dL — ABNORMAL HIGH (ref 65–99)

## 2015-07-24 MED ORDER — POTASSIUM CHLORIDE CRYS ER 20 MEQ PO TBCR
20.0000 meq | EXTENDED_RELEASE_TABLET | Freq: Four times a day (QID) | ORAL | Status: DC
  Administered 2015-07-24 (×3): 20 meq via ORAL
  Filled 2015-07-24 (×3): qty 1

## 2015-07-24 MED ORDER — PREDNISONE 20 MG PO TABS
40.0000 mg | ORAL_TABLET | Freq: Every day | ORAL | Status: DC
  Administered 2015-07-24: 40 mg via ORAL
  Filled 2015-07-24: qty 2

## 2015-07-24 MED ORDER — FUROSEMIDE 40 MG PO TABS
40.0000 mg | ORAL_TABLET | Freq: Two times a day (BID) | ORAL | Status: DC
  Administered 2015-07-24: 40 mg via ORAL
  Filled 2015-07-24: qty 1

## 2015-07-24 NOTE — Care Management Note (Signed)
Case Management Note  Patient Details  Name: Ryan [name] MRN: [id number] Date of Birth: May 24, 1961  Expected Discharge Date:                  Expected Discharge Plan:  [place]  In-House Referral:  NA  Discharge planning Services  CM Consult  Post Acute Care Choice:  Home Health Choice offered to:  Patient  DME Arranged:    DME Agency:     HH Arranged:  RN, PT [place] Agency:  [place]  Status of Service:  In process, will continue to follow  Medicare Important Message Given:  Yes Date Medicare IM Given:    Medicare IM give by:    Date Additional Medicare IM Given:    Additional Medicare Important Message give by:     If discussed at [place] of Stay Meetings, dates discussed:  07/24/2015  Additional Comments:  Sherald Barge, RN 07/24/2015, 3:55 PM

## 2015-07-24 NOTE — Progress Notes (Signed)
Patient's wife educated on how to give insulin injections, demonstrated correct technique.

## 2015-07-24 NOTE — Progress Notes (Signed)
Pt is self medicating with his oxygen. He will raise his Oxygen to 7 L [place] and he will lower it and also. The Pt will take his O2 off. RT educated the Pt on Oxygen but he is non compliant.

## 2015-07-24 NOTE — Progress Notes (Signed)
Patient was standing up at bedside when i arrived to room. Patient was on a NRB. Breathing tx given and placed back on 5LNC, called back to room because patient said he couldn't catch his breath, 02 saturations were 80%. Patient placed on a 55% venturi mask. 02 saturations are 92%. RT will continue to montior.

## 2015-07-24 NOTE — Progress Notes (Signed)
Patient called desk and stated he is having trouble breathing. Pts O2 in the 4s. Placed on Nonrebreather. O2 came up to the 90s. RT gave breathing treatment.

## 2015-07-24 NOTE — Progress Notes (Signed)
Subjective: He says he feels better. His back pain is better. He is improving as far as his breathing is concerned. He still has some swelling of his legs but I think some of this is dependent  Objective: Vital signs in last 24 hours: Temp:  [97.7 F (36.5 C)-98 F (36.7 C)] 97.7 F (36.5 C) (01/10 0536) Pulse Rate:  [83-115] 83 (01/10 0536) Resp:  [20-22] 22 (01/09 2048) BP: (135-150)/(73-79) 150/75 mmHg (01/10 0536) SpO2:  [78 %-95 %] 94 % (01/10 0728) Weight:  [72.938 kg (160 lb 12.8 oz)] 72.938 kg (160 lb 12.8 oz) (01/10 0536) Weight change: -0.091 kg (-3.2 oz) Last BM Date: 07/23/15  Intake/Output from previous day: 01/09 0701 - 01/10 0700 In: 720 [P.O.:720] Out: 500 [Urine:500]  PHYSICAL EXAM General appearance: alert, cooperative and mild distress Resp: rhonchi bilaterally Cardio: irregularly irregular rhythm GI: soft, non-tender; bowel sounds normal; no masses,  no organomegaly Extremities: 1+ edema bilaterally  Lab Results:  Results for orders placed or performed during the hospital encounter of 07/15/15 (from the past 48 hour(s))  Glucose, capillary     Status: Abnormal   Collection Time: 07/22/15 11:13 AM  Result Value Ref Range   Glucose-Capillary 161 (H) 65 - 99 mg/dL  Glucose, capillary     Status: Abnormal   Collection Time: 07/22/15  4:45 PM  Result Value Ref Range   Glucose-Capillary 441 (H) 65 - 99 mg/dL  Glucose, capillary     Status: Abnormal   Collection Time: 07/22/15  7:44 PM  Result Value Ref Range   Glucose-Capillary 352 (H) 65 - 99 mg/dL  Glucose, capillary     Status: Abnormal   Collection Time: 07/23/15  7:59 AM  Result Value Ref Range   Glucose-Capillary 235 (H) 65 - 99 mg/dL  Glucose, capillary     Status: Abnormal   Collection Time: 07/23/15 11:22 AM  Result Value Ref Range   Glucose-Capillary 266 (H) 65 - 99 mg/dL  Glucose, capillary     Status: Abnormal   Collection Time: 07/23/15  5:00 PM  Result Value Ref Range   Glucose-Capillary 177 (H) 65 - 99 mg/dL   Comment 1 Notify RN    Comment 2 Document in Chart   Glucose, capillary     Status: Abnormal   Collection Time: 07/23/15  9:03 PM  Result Value Ref Range   Glucose-Capillary 110 (H) 65 - 99 mg/dL  Glucose, capillary     Status: Abnormal   Collection Time: 07/24/15  7:25 AM  Result Value Ref Range   Glucose-Capillary 315 (H) 65 - 99 mg/dL   Comment 1 Notify RN    Comment 2 Document in Chart     ABGS No results for input(s): PHART, PO2ART, TCO2, HCO3 in the last 72 hours.  Invalid input(s): PCO2 CULTURES No results found for this or any previous visit (from the past 240 hour(s)). Studies/Results: No results found.  Medications:  Prior to Admission:  Prescriptions prior to admission  Medication Sig Dispense Refill Last Dose  . albuterol (VENTOLIN HFA) 108 (90 BASE) MCG/ACT inhaler Inhale 2 puffs into the lungs every 6 (six) hours as needed for wheezing or shortness of breath.   Past Week  . benzonatate (TESSALON) 100 MG capsule Take 1 capsule (100 mg total) by mouth 3 (three) times daily as needed for cough. 30 capsule 0 07/15/2015  . budesonide-formoterol (SYMBICORT) 160-4.5 MCG/ACT inhaler Inhale 2 puffs into the lungs 2 (two) times daily.   07/14/2015  . Dextromethorphan-Guaifenesin 20-400  MG TABS Take 1 tablet by mouth every 6 (six) hours as needed (mucus).   Past Week  . diltiazem (CARDIZEM CD) 120 MG 24 hr capsule Take 1 capsule (120 mg total) by mouth daily. 30 capsule 11 07/15/2015  . fluticasone (FLONASE) 50 MCG/ACT nasal spray Place 2 sprays into both nostrils daily.   07/14/2015  . LORazepam (ATIVAN) 0.5 MG tablet Take 1 tablet (0.5 mg total) by mouth 2 (two) times daily as needed for anxiety or sleep. 20 tablet 0 07/15/2015  . metFORMIN (GLUCOPHAGE) 500 MG tablet Take 1 tablet (500 mg total) by mouth daily with breakfast. 30 tablet 3 07/15/2015  . montelukast (SINGULAIR) 10 MG tablet Take 10 mg by mouth at bedtime.   07/14/2015  .  oxyCODONE-acetaminophen (PERCOCET) 10-325 MG tablet Take 1 tablet by mouth every 4 (four) hours as needed for pain.   07/14/2015  . oxyCODONE-acetaminophen (ROXICET) 5-325 MG per tablet Take 1 tablet by mouth every 4 (four) hours as needed for severe pain. 25 tablet 0 07/14/2015  . pantoprazole (PROTONIX) 40 MG tablet Take 1 tablet (40 mg total) by mouth 2 (two) times daily. 60 tablet 0 07/15/2015  . promethazine-dextromethorphan (PROMETHAZINE-DM) 6.25-15 MG/5ML syrup Take 5 mLs by mouth 4 (four) times daily as needed for cough.   07/14/2015  . SPIRIVA RESPIMAT 2.5 MCG/ACT AERS Inhale 1 puff into the lungs daily.   07/15/2015  . baclofen (LIORESAL) 10 MG tablet Take 10 mg by mouth 3 (three) times daily as needed.   Unknown  . edoxaban (SAVAYSA) 60 MG TABS tablet Take 60 mg by mouth daily. 30 tablet 11 Couple Weeks  . levalbuterol (XOPENEX) 0.63 MG/3ML nebulizer solution Take 3 mLs (0.63 mg total) by nebulization 3 (three) times daily. (Patient taking differently: Take 0.63 mg by nebulization every 8 (eight) hours as needed for wheezing or shortness of breath. ) 3 mL 12 07/12/2015  . nicotine (NICODERM CQ - DOSED IN MG/24 HOURS) 21 mg/24hr patch Place 1 patch (21 mg total) onto the skin daily. 28 patch 0 Taking  . nitroGLYCERIN (NITROSTAT) 0.4 MG SL tablet Place 1 tablet (0.4 mg total) under the tongue every 5 (five) minutes as needed for chest pain. 25 tablet 4 Never  . predniSONE (DELTASONE) 20 MG tablet Take 2 tablets (40 mg total) by mouth daily. 10 tablet 0    Scheduled: . antiseptic oral rinse  7 mL Mouth Rinse BID  . benzonatate  100 mg Oral TID  . diltiazem  120 mg Oral Daily  . edoxaban  60 mg Oral Daily  . fluticasone  2 spray Each Nare Daily  . furosemide  40 mg Oral BID  . guaiFENesin  1,200 mg Oral BID  . insulin aspart  0-20 Units Subcutaneous TID WC  . insulin aspart  0-5 Units Subcutaneous QHS  . insulin aspart  5 Units Subcutaneous TID WC  . insulin glargine  38 Units Subcutaneous  Daily  . ipratropium  0.5 mg Nebulization Q6H  . levalbuterol  1.25 mg Nebulization Q6H  . LORazepam  0.5 mg Oral 2 times per day  . montelukast  10 mg Oral QHS  . nicotine  14 mg Transdermal Daily  . pantoprazole  40 mg Oral BID  . potassium chloride  20 mEq Oral QID  . predniSONE  40 mg Oral Q breakfast  . sodium chloride  3 mL Intravenous Q12H  . sodium chloride  3 mL Intravenous Q12H   Continuous:  RXV:QMGQQP chloride, HYDROcodone-homatropine, levalbuterol, menthol-cetylpyridinium, methocarbamol,  morphine injection, nitroGLYCERIN, ondansetron **OR** ondansetron (ZOFRAN) IV, oxyCODONE-acetaminophen, sodium chloride  Assesment: He was admitted with COPD exacerbation and acute on chronic respiratory failure. He is much improved.  He has had back pain and that's better  His blood sugar is still up area Principal Problem:   Decompensated COPD with exacerbation (chronic obstructive pulmonary disease) (HCC) Active Problems:   Hyperlipidemia   Type 2 diabetes mellitus without complication ([place])   Cavitary lesion of lung   Acute on chronic respiratory failure (HCC)   COPD exacerbation ([place])    Plan: Transition to oral meds today discharge tomorrow he needs diabetic teaching    LOS: 9 days   Daizy Outen L 07/24/2015, 8:48 AM

## 2015-07-25 ENCOUNTER — Inpatient Hospital Stay (HOSPITAL_COMMUNITY): Payer: Medicare Other

## 2015-07-25 LAB — HEPATIC FUNCTION PANEL
ALBUMIN: 2.6 g/dL — AB (ref 3.5–5.0)
ALT: 64 U/L — AB (ref 17–63)
AST: 51 U/L — AB (ref 15–41)
Alkaline Phosphatase: 73 U/L (ref 38–126)
BILIRUBIN DIRECT: 0.3 mg/dL (ref 0.1–0.5)
BILIRUBIN TOTAL: 0.9 mg/dL (ref 0.3–1.2)
Indirect Bilirubin: 0.6 mg/dL (ref 0.3–0.9)
Total Protein: 6.4 g/dL — ABNORMAL LOW (ref 6.5–8.1)

## 2015-07-25 LAB — BLOOD GAS, ARTERIAL
Acid-Base Excess: 2.8 mmol/L — ABNORMAL HIGH (ref 0.0–2.0)
Acid-Base Excess: 3.4 mmol/L — ABNORMAL HIGH (ref 0.0–2.0)
Bicarbonate: 26.6 mEq/L — ABNORMAL HIGH (ref 20.0–24.0)
Bicarbonate: 27.2 mEq/L — ABNORMAL HIGH (ref 20.0–24.0)
Drawn by: 317771
Drawn by: 25788
FIO2: 1
FIO2: 40
LHR: 14 {breaths}/min
MECHVT: 520 mL
O2 Saturation: 95.1 %
O2 Saturation: 92.4 %
PCO2 ART: 42.8 mmHg (ref 35.0–45.0)
PEEP: 5 cmH2O
PO2 ART: 72.9 mmHg — AB (ref 80.0–100.0)
TCO2: 16.6 mmol/L (ref 0–100)
pCO2 arterial: 43.7 mmHg (ref 35.0–45.0)
pH, Arterial: 7.409 (ref 7.350–7.450)
pH, Arterial: 7.424 (ref 7.350–7.450)
pO2, Arterial: 90.5 mmHg (ref 80.0–100.0)

## 2015-07-25 LAB — BASIC METABOLIC PANEL
ANION GAP: 17 — AB (ref 5–15)
BUN: 33 mg/dL — ABNORMAL HIGH (ref 6–20)
CHLORIDE: 95 mmol/L — AB (ref 101–111)
CO2: 26 mmol/L (ref 22–32)
Calcium: 9 mg/dL (ref 8.9–10.3)
Creatinine, Ser: 1.24 mg/dL (ref 0.61–1.24)
GFR calc non Af Amer: >60 mL/min (ref >60)
GLUCOSE: 176 mg/dL — AB (ref 65–99)
Potassium: 5.6 mmol/L — ABNORMAL HIGH (ref 3.5–5.1)
Sodium: 138 mmol/L (ref 135–145)

## 2015-07-25 LAB — CBC WITH DIFFERENTIAL/PLATELET
BAND NEUTROPHILS: 2 %
BLASTS: 0 %
Basophils Absolute: 0 10*3/uL (ref 0.0–0.1)
Basophils Relative: 0 %
EOS ABS: 0 10*3/uL (ref 0.0–0.7)
Eosinophils Relative: 0 %
HCT: 40 % (ref 39.0–52.0)
Hemoglobin: 12.6 g/dL — ABNORMAL LOW (ref 13.0–17.0)
Lymphocytes Relative: 17 %
Lymphs Abs: 6.6 10*3/uL — ABNORMAL HIGH (ref 0.7–4.0)
MCH: 23.8 pg — ABNORMAL LOW (ref 26.0–34.0)
MCHC: 31.5 g/dL (ref 30.0–36.0)
MCV: 75.6 fL — AB (ref 78.0–100.0)
MONOS PCT: 3 %
Metamyelocytes Relative: 0 %
Monocytes Absolute: 1.2 10*3/uL — ABNORMAL HIGH (ref 0.1–1.0)
Myelocytes: 0 %
NEUTROS ABS: 30.8 10*3/uL — AB (ref 1.7–7.7)
NEUTROS PCT: 78 %
NRBC: 0 /100{WBCs}
OTHER: 0 %
PLATELETS: 362 10*3/uL (ref 150–400)
PROMYELOCYTES ABS: 0 %
RBC: 5.29 MIL/uL (ref 4.22–5.81)
RDW: 17.6 % — ABNORMAL HIGH (ref 11.5–15.5)
WBC: 38.6 10*3/uL — ABNORMAL HIGH (ref 4.0–10.5)

## 2015-07-25 LAB — GLUCOSE, CAPILLARY
GLUCOSE-CAPILLARY: 169 mg/dL — AB (ref 65–99)
Glucose-Capillary: 122 mg/dL — ABNORMAL HIGH (ref 65–99)
Glucose-Capillary: 85 mg/dL (ref 65–99)
Glucose-Capillary: 116 mg/dL — ABNORMAL HIGH (ref 65–99)

## 2015-07-25 LAB — TROPONIN I
TROPONIN I: 0.05 ng/mL — AB (ref <0.031)
Troponin I: 0.04 ng/mL — ABNORMAL HIGH (ref <0.031)
Troponin I: <0.03 ng/mL (ref <0.031)

## 2015-07-25 LAB — LACTIC ACID, PLASMA: Lactic Acid, Venous: 4.2 mmol/L (ref 0.5–2.0)

## 2015-07-25 LAB — BRAIN NATRIURETIC PEPTIDE: B Natriuretic Peptide: 113 pg/mL — ABNORMAL HIGH (ref 0.0–100.0)

## 2015-07-25 LAB — MRSA PCR SCREENING: MRSA by PCR: POSITIVE — AB

## 2015-07-25 MED ORDER — LEVALBUTEROL HCL 0.63 MG/3ML IN NEBU
0.6300 mg | INHALATION_SOLUTION | RESPIRATORY_TRACT | Status: DC
  Administered 2015-07-25 – 2015-07-29 (×24): 0.63 mg via RESPIRATORY_TRACT
  Filled 2015-07-25 (×24): qty 3

## 2015-07-25 MED ORDER — FENTANYL CITRATE (PF) 100 MCG/2ML IJ SOLN: 50.0000 ug | INTRAMUSCULAR | Status: DC | PRN

## 2015-07-25 MED ORDER — VANCOMYCIN HCL IN DEXTROSE 1-5 GM/200ML-% IV SOLN
1000.0000 mg | Freq: Two times a day (BID) | INTRAVENOUS | Status: DC
  Administered 2015-07-25 – 2015-07-29 (×8): 1000 mg via INTRAVENOUS
  Filled 2015-07-25 (×7): qty 200

## 2015-07-25 MED ORDER — SODIUM CHLORIDE 0.9 % IV BOLUS (SEPSIS)
1000.0000 mL | Freq: Once | INTRAVENOUS | Status: AC
  Administered 2015-07-25: 1000 mL via INTRAVENOUS

## 2015-07-25 MED ORDER — PANTOPRAZOLE SODIUM 40 MG IV SOLR
40.0000 mg | Freq: Every day | INTRAVENOUS | Status: DC
  Administered 2015-07-25 – 2015-08-01 (×8): 40 mg via INTRAVENOUS
  Filled 2015-07-25 (×8): qty 40

## 2015-07-25 MED ORDER — FUROSEMIDE 10 MG/ML IJ SOLN
80.0000 mg | Freq: Two times a day (BID) | INTRAMUSCULAR | Status: DC
  Administered 2015-07-25 – 2015-08-01 (×15): 80 mg via INTRAVENOUS
  Filled 2015-07-25 (×16): qty 8

## 2015-07-25 MED ORDER — METHYLPREDNISOLONE SODIUM SUCC 125 MG IJ SOLR
125.0000 mg | Freq: Four times a day (QID) | INTRAMUSCULAR | Status: DC
  Administered 2015-07-25 – 2015-07-30 (×20): 125 mg via INTRAVENOUS
  Filled 2015-07-25 (×20): qty 2

## 2015-07-25 MED ORDER — LORAZEPAM 2 MG/ML IJ SOLN: 1.0000 mg | Freq: Once | INTRAMUSCULAR | Status: DC

## 2015-07-25 MED ORDER — ANTISEPTIC ORAL RINSE SOLUTION (CORINZ)
7.0000 mL | Freq: Four times a day (QID) | OROMUCOSAL | Status: DC
  Administered 2015-07-25 – 2015-07-26 (×5): 7 mL via OROMUCOSAL

## 2015-07-25 MED ORDER — VANCOMYCIN HCL 10 G IV SOLR
1500.0000 mg | Freq: Once | INTRAVENOUS | Status: AC
  Administered 2015-07-25: 1500 mg via INTRAVENOUS
  Filled 2015-07-25: qty 1500

## 2015-07-25 MED ORDER — MIDAZOLAM HCL 2 MG/2ML IJ SOLN
2.0000 mg | INTRAMUSCULAR | Status: DC | PRN
  Administered 2015-07-25 – 2015-07-26 (×2): 2 mg via INTRAVENOUS
  Filled 2015-07-25 (×3): qty 2

## 2015-07-25 MED ORDER — PROPOFOL 1000 MG/100ML IV EMUL
5.0000 ug/kg/min | INTRAVENOUS | Status: DC
  Administered 2015-07-25: 5 ug/kg/min via INTRAVENOUS

## 2015-07-25 MED ORDER — IPRATROPIUM BROMIDE 0.02 % IN SOLN
0.5000 mg | RESPIRATORY_TRACT | Status: DC
  Administered 2015-07-25 – 2015-07-29 (×24): 0.5 mg via RESPIRATORY_TRACT
  Filled 2015-07-25 (×24): qty 2.5

## 2015-07-25 MED ORDER — FENTANYL CITRATE (PF) 100 MCG/2ML IJ SOLN
50.0000 ug | Freq: Once | INTRAMUSCULAR | Status: AC
  Administered 2015-07-25: 50 ug via INTRAVENOUS
  Filled 2015-07-25: qty 2

## 2015-07-25 MED ORDER — LORAZEPAM 2 MG/ML IJ SOLN
INTRAMUSCULAR | Status: AC
  Administered 2015-07-25: 07:00:00
  Filled 2015-07-25: qty 1

## 2015-07-25 MED ORDER — CHLORHEXIDINE GLUCONATE 0.12% ORAL RINSE (MEDLINE KIT)
15.0000 mL | Freq: Two times a day (BID) | OROMUCOSAL | Status: DC
  Administered 2015-07-25 – 2015-07-26 (×3): 15 mL via OROMUCOSAL

## 2015-07-25 MED ORDER — PROPOFOL 1000 MG/100ML IV EMUL
INTRAVENOUS | Status: AC
  Administered 2015-07-25: 5 ug/kg/min via INTRAVENOUS
  Filled 2015-07-25: qty 100

## 2015-07-25 MED ORDER — SODIUM CHLORIDE 0.9 % IV SOLN
25.0000 ug/h | INTRAVENOUS | Status: DC
  Administered 2015-07-25: 50 ug/h via INTRAVENOUS
  Administered 2015-07-26: 100 ug/h via INTRAVENOUS
  Filled 2015-07-25 (×2): qty 50

## 2015-07-25 MED ORDER — CHLORHEXIDINE GLUCONATE CLOTH 2 % EX PADS
6.0000 | MEDICATED_PAD | Freq: Every day | CUTANEOUS | Status: AC
  Administered 2015-07-25 – 2015-07-29 (×5): 6 via TOPICAL

## 2015-07-25 MED ORDER — MIDAZOLAM HCL 2 MG/2ML IJ SOLN
1.0000 mg | INTRAMUSCULAR | Status: DC | PRN
  Administered 2015-07-25 (×2): 1 mg via INTRAVENOUS
  Filled 2015-07-25 (×2): qty 2

## 2015-07-25 MED ORDER — FENTANYL CITRATE (PF) 100 MCG/2ML IJ SOLN
12.5000 ug | INTRAMUSCULAR | Status: DC | PRN
  Administered 2015-07-25 – 2015-07-28 (×10): 12.5 ug via INTRAVENOUS
  Filled 2015-07-25 (×12): qty 2

## 2015-07-25 MED ORDER — MIDAZOLAM HCL 2 MG/2ML IJ SOLN
2.0000 mg | INTRAMUSCULAR | Status: DC | PRN
  Administered 2015-07-25: 2 mg via INTRAVENOUS

## 2015-07-25 MED ORDER — MUPIROCIN 2 % EX OINT
1.0000 "application " | TOPICAL_OINTMENT | Freq: Two times a day (BID) | CUTANEOUS | Status: AC
  Administered 2015-07-25 – 2015-07-29 (×10): 1 via NASAL
  Filled 2015-07-25 (×2): qty 22

## 2015-07-25 MED ORDER — PIPERACILLIN-TAZOBACTAM 3.375 G IVPB
3.3750 g | Freq: Three times a day (TID) | INTRAVENOUS | Status: DC
  Administered 2015-07-25 – 2015-07-28 (×10): 3.375 g via INTRAVENOUS
  Filled 2015-07-25 (×7): qty 50

## 2015-07-25 NOTE — Progress Notes (Addendum)
Wasted 50cc of Diprivan in sink with Sharene Skeans, RN. Schonewitz, Eulis Canner 07/25/2015  3:24 PM   Wasted 50cc of Diprivan in sink with Burna Sis, RN

## 2015-07-25 NOTE — ED Notes (Signed)
7:00 AM  Called to ICU for respiratory distress. Patient here with COPD exacerbation. Diminished breath sounds diffusely with expiratory wheezing. Patient has been getting Xopenex. Recent ABG shows normal pH, normal PCO2. I feel there is a large component of anxiety. He has been on BiPAP for 1 hour without significant improvement and has heart rate in the 150s to 160s, sinus tachycardia, respiratory rate in the 50s, sats in the 90s on BiPAP. Decision was made to intubate patient given his increased work of breathing, respiratory distress. Patient agreed to intubation prior to the procedure.  Equal breath sounds bilaterally after intubation. Portable chest x-ray pending and will be followed up by patient's primary care physician Dr. Luan Pulling.   INTUBATION Performed by: Nyra Jabs  Required items: required blood products, implants, devices, and special equipment available Patient identity confirmed: provided demographic data and hospital-assigned identification number Time out: Immediately prior to procedure a "time out" was called to verify the correct patient, procedure, equipment, support staff and site/side marked as required.  Indications: Respiratory distress  Intubation method: Glidescope Laryngoscopy   Preoxygenation: BVM  Sedatives: 30 mg IV Etomidate Paralytic: 100 mg IV Succinylcholine  Tube Size: 7.5 cuffed  Post-procedure assessment: chest rise and ETCO2 monitor Breath sounds: equal and absent over the epigastrium Tube secured with: ETT holder  Chest x-ray findings: to be followed up by PCP  Patient tolerated the procedure well with no immediate complications.     [place], DO 07/25/15 [phone]

## 2015-07-25 NOTE — Progress Notes (Signed)
ANTIBIOTIC CONSULT NOTE - INITIAL  Pharmacy Consult for Vancomycin and Zosyn Indication: pneumonia  Allergies  Allergen Reactions  . Albuterol Palpitations  . Influenza Vaccine Live Swelling   Patient Measurements: Height: '5\' 8"'$  (172.7 cm) Weight: 159 lb 8 oz (72.349 kg) IBW/kg (Calculated) : 68.4  Vital Signs: Temp: 98 F (36.7 C) (01/11 0455) Temp Source: Oral (01/11 0455) BP: 145/102 mmHg (01/11 0455) Pulse Rate: 113 (01/11 0455) Intake/Output from previous day: 01/10 0701 - 01/11 0700 In: 720 [P.O.:720] Out: -  Intake/Output from this shift:    Labs:  Recent Labs  07/25/15 0611  CREATININE 1.24   Estimated Creatinine Clearance: 64.4 mL/min (by C-G formula based on Cr of 1.24). No results for input(s): VANCOTROUGH, VANCOPEAK, VANCORANDOM, GENTTROUGH, GENTPEAK, GENTRANDOM, TOBRATROUGH, TOBRAPEAK, TOBRARND, AMIKACINPEAK, AMIKACINTROU, AMIKACIN in the last 72 hours.   Microbiology: No results found for this or any previous visit (from the past 720 hour(s)).  Medical History: Past Medical History  Diagnosis Date  . COPD (chronic obstructive pulmonary disease) ([place])   . Hypercholesteremia   . History of pneumonia   . Polycythemia   . Coronary atherosclerosis of native coronary artery     Mild nonobstructive disease at catheterization 2007  . 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 Borderline diabetes   . Seizures ([place])     Last seizure 2 yrs ago  . GERD (gastroesophageal reflux disease)   . Type 2 diabetes mellitus ([place])   . DDD (degenerative disc disease)     Cervical and thoracic  . Chronic back pain   . Bronchitis   . Chronic left shoulder pain   . Chronic neck pain   . Atrial fibrillation ([place])     Not anticoagulated  . Smoker   . Type 2 diabetes mellitus without complication ([place])   . CAP (community acquired pneumonia) 07/10/2013    04/2015  . Collagen vascular disease ([place])   . Chronic respiratory failure  (HCC)    Anti-infectives    Start     Dose/Rate Route Frequency Ordered Stop   07/25/15 2200  vancomycin (VANCOCIN) IVPB 1000 mg/200 mL premix     1,000 mg 200 mL/hr over 60 Minutes Intravenous Every 12 hours 07/25/15 0742     07/25/15 1000  vancomycin (VANCOCIN) 1,500 mg in sodium chloride 0.9 % 500 mL IVPB     1,500 mg 250 mL/hr over 120 Minutes Intravenous  Once 07/25/15 0739     07/25/15 0800  piperacillin-tazobactam (ZOSYN) IVPB 3.375 g     3.375 g 12.5 mL/hr over 240 Minutes Intravenous Every 8 hours 07/25/15 0738     07/17/15 1000  azithromycin (ZITHROMAX) tablet 250 mg     250 mg Oral Daily 07/15/15 2212 07/20/15 0833   07/16/15 1000  azithromycin (ZITHROMAX) tablet 500 mg     500 mg Oral Daily 07/15/15 2212 07/16/15 1050     Assessment: 55yo male with worsening respiratory distress.  Transferred to ICU, asked to initiate Vancomycin and Zosyn for pna.  Estimated Creatinine Clearance: 64.4 mL/min (by C-G formula based on Cr of 1.24).  Goal of Therapy:  Vancomycin trough level 15-20 mcg/ml  Plan:  Zosyn 3.375gm IV q8h, EID Vancomycin '1500mg'$  IV now x 1 then Vancomycin '1000mg'$  IV q12hrs Check trough at steady state Monitor labs, renal fxn, progress and c/s Deescalate ABX when improved / appropriate  [place] Crane, Tandrea Kommer A 07/25/2015,7:54 AM

## 2015-07-25 NOTE — Progress Notes (Signed)
CRITICAL VALUE ALERT  Critical value received:  MRSA positive Date of notification:  07/25/2015   Time of notification:  10:56 AM   Critical value read back:Yes.    Nurse who received alert:  Harmoney Sienkiewicz anne schonewitz, rn     MD notified (1st page):  Luan Pulling  Time of first page:  10:56 AM   MD notified (2nd page):  Time of second page:  Responding MD:  Luan Pulling Time MD responded:  10:57 AM

## 2015-07-25 NOTE — Progress Notes (Signed)
Called by 300 for patient in severe resp distress, ABG had been drawn by RT and patient had been placed on Bipap; patient was found sitting up in the recliner in the room, resp rate in the 50's with O2 sats in upper 80's; patient was then transferred to the ICU via recliner by RN and RT on portable BiPap; patient was then placed on ICU bed connected to bedside cardiac monitor; Elink notified of patient declining status instructed to intubate patient; Dr Leonides Schanz arrived to intubate the patient; patient was explained the process and need for intubation and he agreed; his wife was given updates by Barnett Applebaum RN [facility]; patient was intubated on the first attempt by Dr Leonides Schanz using the glidescope, patient was given '30mg'$  of Etomidate and '100mg'$  of Succs; ETT secured with commercial tube holder, verified with EtCO2 detector and ausculation; additional #22 gauge IV inserted in the right wrist; foley inserted without difficulty; portable chest film completed to validate tube placement;  Dr Luan Pulling in the room assessing the patient with orders to be placed on the chart; report given to Darius Bump, RN

## 2015-07-25 NOTE — Progress Notes (Signed)
Reassessment shows that he is still hypotensive. I'm concerned about his fluid retention but I think he's probably septic so I'm going to go and give him a bolus of 1 L of IV fluids. He is still agitated so on going to add lorazepam. His lactate level was greater than 4 and his white blood count is 30,000+.

## 2015-07-25 NOTE — Progress Notes (Signed)
CRITICAL VALUE ALERT  Critical value received: lactic acid 4.2  Date of notification:  07/25/2015  Time of notification:  4287  Critical value read back:Yes.    Nurse who received alert:  Eulis Canner schonewitz, rn   MD notified (1st page):  Luan Pulling Time of first page:  1035   MD notified (2nd page):  Time of second page:  Responding MD:  Luan Pulling  Time MD responded:  1040

## 2015-07-25 NOTE — Progress Notes (Signed)
Inpatient Diabetes Program Recommendations  AACE/ADA: New Consensus Statement on Inpatient Glycemic Control (2015)  Target Ranges:  Prepandial:   less than 140 mg/dL      Peak postprandial:   less than 180 mg/dL (1-2 hours)      Critically ill patients:  140 - 180 mg/dL  Results for ABLE, MALLOY (MRN [id number]) as of 07/25/2015 11:19  Ref. Range 07/24/2015 07:25 07/24/2015 11:03 07/24/2015 16:47 07/24/2015 21:00 07/25/2015 07:56  Glucose-Capillary Latest Ref Range: 65-99 mg/dL 315 (H) 300 (H) 136 (H) 211 (H) 122 (H)   Review of Glycemic Control  Current orders for Inpatient glycemic control: Novolog 0-20 units TID with meals, Novolog 0-5 units HS  Inpatient Diabetes Program Recommendations:  Correction (SSI): Please consider changing frequency of CBGs and Novolog correction to Q4H since patient is NPO and on the ventilator.  Thanks, Barnie Alderman, RN, MSN, CDE Diabetes Coordinator Inpatient Diabetes Program [phone] (Team Pager from [place] to [place]) [phone] ([place] office) [phone] [facility] office) [phone] [facility] office)

## 2015-07-25 NOTE — Progress Notes (Addendum)
Subjective: He was doing well yesterday and I had actually anticipated discharging him this morning. However he developed increasing problems with shortness of breath yesterday evening. This culminated in him being transferred to the intensive care unit and intubated and placed on mechanical ventilation. This occurred at about 7:00 this morning. When I arrived he had just been intubated and I started him on propofol. He was agitated.  Objective: Vital signs in last 24 hours: Temp:  [98 F (36.7 C)-98.1 F (36.7 C)] 98.1 F (36.7 C) (01/11 0807) Pulse Rate:  [102-113] 113 (01/11 0455) Resp:  [21-22] 22 (01/11 0455) BP: (121-147)/(63-102) 145/102 mmHg (01/11 0455) SpO2:  [89 %-98 %] 97 % (01/11 0455) FiO2 (%):  [55 %-100 %] 80 % (01/11 0709) Weight:  [72.349 kg (159 lb 8 oz)] 72.349 kg (159 lb 8 oz) (01/11 0455) Weight change: -0.59 kg (-1 lb 4.8 oz) Last BM Date: 07/23/15  Intake/Output from previous day: 01/10 0701 - 01/11 0700 In: 720 [P.O.:720] Out: -   PHYSICAL EXAM General appearance: Intubated now sedated on mechanical ventilation Resp: rhonchi bilaterally Cardio: irregularly irregular rhythm and With tachycardia at about 120 GI: soft, non-tender; bowel sounds normal; no masses,  no organomegaly Extremities: He has continued edema of both legs. This looks worse than yesterday  Lab Results:  Results for orders placed or performed during the hospital encounter of 07/15/15 (from the past 48 hour(s))  Glucose, capillary     Status: Abnormal   Collection Time: 07/23/15 11:22 AM  Result Value Ref Range   Glucose-Capillary 266 (H) 65 - 99 mg/dL  Glucose, capillary     Status: Abnormal   Collection Time: 07/23/15  5:00 PM  Result Value Ref Range   Glucose-Capillary 177 (H) 65 - 99 mg/dL   Comment 1 Notify RN    Comment 2 Document in Chart   Glucose, capillary     Status: Abnormal   Collection Time: 07/23/15  9:03 PM  Result Value Ref Range   Glucose-Capillary 110 (H) 65 -  99 mg/dL  Glucose, capillary     Status: Abnormal   Collection Time: 07/24/15  7:25 AM  Result Value Ref Range   Glucose-Capillary 315 (H) 65 - 99 mg/dL   Comment 1 Notify RN    Comment 2 Document in Chart   Glucose, capillary     Status: Abnormal   Collection Time: 07/24/15 11:03 AM  Result Value Ref Range   Glucose-Capillary 300 (H) 65 - 99 mg/dL   Comment 1 Notify RN    Comment 2 Document in Chart   Glucose, capillary     Status: Abnormal   Collection Time: 07/24/15  4:47 PM  Result Value Ref Range   Glucose-Capillary 136 (H) 65 - 99 mg/dL   Comment 1 Notify RN    Comment 2 Document in Chart   Glucose, capillary     Status: Abnormal   Collection Time: 07/24/15  9:00 PM  Result Value Ref Range   Glucose-Capillary 211 (H) 65 - 99 mg/dL  Basic metabolic panel     Status: Abnormal   Collection Time: 07/25/15  6:11 AM  Result Value Ref Range   Sodium 138 135 - 145 mmol/L   Potassium 5.6 (H) 3.5 - 5.1 mmol/L   Chloride 95 (L) 101 - 111 mmol/L   CO2 26 22 - 32 mmol/L   Glucose, Bld 176 (H) 65 - 99 mg/dL   BUN 33 (H) 6 - 20 mg/dL   Creatinine, Ser 1.24 0.61 -  1.24 mg/dL   Calcium 9.0 8.9 - 10.3 mg/dL   GFR calc non Af Amer >60 >60 mL/min   GFR calc Af Amer >60 >60 mL/min    Comment: (NOTE) The eGFR has been calculated using the CKD EPI equation. This calculation has not been validated in all clinical situations. eGFR's persistently <60 mL/min signify possible Chronic Kidney Disease.    Anion gap 17 (H) 5 - 15  Blood gas, arterial     Status: Abnormal   Collection Time: 07/25/15  6:16 AM  Result Value Ref Range   FIO2 1.00    Delivery systems NON-REBREATHER OXYGEN MASK    pH, Arterial 7.409 7.350 - 7.450   pCO2 arterial 43.7 35.0 - 45.0 mmHg   pO2, Arterial 90.5 80.0 - 100.0 mmHg   Bicarbonate 26.6 (H) 20.0 - 24.0 mEq/L   TCO2 16.6 0 - 100 mmol/L   Acid-Base Excess 2.8 (H) 0.0 - 2.0 mmol/L   O2 Saturation 95.1 %   Collection site RIGHT RADIAL    Drawn by [id number]     Sample type ARTERIAL DRAW    Allens test (pass/fail) PASS PASS  Glucose, capillary     Status: Abnormal   Collection Time: 07/25/15  7:56 AM  Result Value Ref Range   Glucose-Capillary 122 (H) 65 - 99 mg/dL   Comment 1 Notify RN    Comment 2 Document in Chart     ABGS  Recent Labs  07/25/15 0616  PHART 7.409  PO2ART 90.5  TCO2 16.6  HCO3 26.6*   CULTURES No results found for this or any previous visit (from the past 240 hour(s)). Studies/Results: Dg Chest Port 1 View  07/25/2015  CLINICAL DATA:  Intubated patient, shortness of breath EXAM: PORTABLE CHEST 1 VIEW COMPARISON:  Chest x-ray of July 15, 2015, and chest CT scan dated May 01, 2015. FINDINGS: The lungs are well-expanded. There is confluent interstitial infiltrate in the right lower lobe. The right hemidiaphragm is now obscured. There is patchy interstitial density at the left lung base which is stable. The heart is normal in size. The pulmonary vascularity is prominent centrally but there is no pulmonary vascular congestion. The endotracheal tube tip lies approximately 5.7 cm above the carina. IMPRESSION: Bullous emphysema. Worsening of interstitial infiltrate in the right lower lung likely reflecting pneumonia. Stable interstitial density at the left lung base. The endotracheal tube is in reasonable position. Electronically Signed   By: Alvis  Martinique M.D.   On: 07/25/2015 08:16    Medications:  Prior to Admission:  Prescriptions prior to admission  Medication Sig Dispense Refill Last Dose  . albuterol (VENTOLIN HFA) 108 (90 BASE) MCG/ACT inhaler Inhale 2 puffs into the lungs every 6 (six) hours as needed for wheezing or shortness of breath.   Past Week  . benzonatate (TESSALON) 100 MG capsule Take 1 capsule (100 mg total) by mouth 3 (three) times daily as needed for cough. 30 capsule 0 07/15/2015  . budesonide-formoterol (SYMBICORT) 160-4.5 MCG/ACT inhaler Inhale 2 puffs into the lungs 2 (two) times daily.   07/14/2015   . Dextromethorphan-Guaifenesin 20-400 MG TABS Take 1 tablet by mouth every 6 (six) hours as needed (mucus).   Past Week  . diltiazem (CARDIZEM CD) 120 MG 24 hr capsule Take 1 capsule (120 mg total) by mouth daily. 30 capsule 11 07/15/2015  . fluticasone (FLONASE) 50 MCG/ACT nasal spray Place 2 sprays into both nostrils daily.   07/14/2015  . LORazepam (ATIVAN) 0.5 MG tablet Take 1 tablet (  0.5 mg total) by mouth 2 (two) times daily as needed for anxiety or sleep. 20 tablet 0 07/15/2015  . metFORMIN (GLUCOPHAGE) 500 MG tablet Take 1 tablet (500 mg total) by mouth daily with breakfast. 30 tablet 3 07/15/2015  . montelukast (SINGULAIR) 10 MG tablet Take 10 mg by mouth at bedtime.   07/14/2015  . oxyCODONE-acetaminophen (PERCOCET) 10-325 MG tablet Take 1 tablet by mouth every 4 (four) hours as needed for pain.   07/14/2015  . oxyCODONE-acetaminophen (ROXICET) 5-325 MG per tablet Take 1 tablet by mouth every 4 (four) hours as needed for severe pain. 25 tablet 0 07/14/2015  . pantoprazole (PROTONIX) 40 MG tablet Take 1 tablet (40 mg total) by mouth 2 (two) times daily. 60 tablet 0 07/15/2015  . promethazine-dextromethorphan (PROMETHAZINE-DM) 6.25-15 MG/5ML syrup Take 5 mLs by mouth 4 (four) times daily as needed for cough.   07/14/2015  . SPIRIVA RESPIMAT 2.5 MCG/ACT AERS Inhale 1 puff into the lungs daily.   07/15/2015  . baclofen (LIORESAL) 10 MG tablet Take 10 mg by mouth 3 (three) times daily as needed.   Unknown  . edoxaban (SAVAYSA) 60 MG TABS tablet Take 60 mg by mouth daily. 30 tablet 11 Couple Weeks  . levalbuterol (XOPENEX) 0.63 MG/3ML nebulizer solution Take 3 mLs (0.63 mg total) by nebulization 3 (three) times daily. (Patient taking differently: Take 0.63 mg by nebulization every 8 (eight) hours as needed for wheezing or shortness of breath. ) 3 mL 12 07/12/2015  . nicotine (NICODERM CQ - DOSED IN MG/24 HOURS) 21 mg/24hr patch Place 1 patch (21 mg total) onto the skin daily. 28 patch 0 Taking  .  nitroGLYCERIN (NITROSTAT) 0.4 MG SL tablet Place 1 tablet (0.4 mg total) under the tongue every 5 (five) minutes as needed for chest pain. 25 tablet 4 Never  . predniSONE (DELTASONE) 20 MG tablet Take 2 tablets (40 mg total) by mouth daily. 10 tablet 0    Scheduled: . antiseptic oral rinse  7 mL Mouth Rinse BID  . antiseptic oral rinse  7 mL Mouth Rinse QID  . chlorhexidine gluconate  15 mL Mouth Rinse BID  . edoxaban  60 mg Oral Daily  . furosemide  80 mg Intravenous Q12H  . insulin aspart  0-20 Units Subcutaneous TID WC  . insulin aspart  0-5 Units Subcutaneous QHS  . ipratropium  0.5 mg Nebulization Q4H  . levalbuterol  0.63 mg Nebulization Q4H  . LORazepam      . methylPREDNISolone (SOLU-MEDROL) injection  125 mg Intravenous Q6H  . nicotine  14 mg Transdermal Daily  . pantoprazole (PROTONIX) IV  40 mg Intravenous Daily  . piperacillin-tazobactam (ZOSYN)  IV  3.375 g Intravenous Q8H  . sodium chloride  3 mL Intravenous Q12H  . sodium chloride  3 mL Intravenous Q12H  . vancomycin  1,500 mg Intravenous Once  . vancomycin  1,000 mg Intravenous Q12H   Continuous: . propofol (DIPRIVAN) infusion 20 mcg/kg/min (07/25/15 0801)   OMV:EHMCNO chloride, fentaNYL (SUBLIMAZE) injection, nitroGLYCERIN, [DISCONTINUED] ondansetron **OR** ondansetron (ZOFRAN) IV, sodium chloride  Assesment: He has acute on chronic respiratory failure now ventilator dependent. Chest x-ray shows pneumonia. It's in the right lower lobe so he could have aspirated which may have started the sequence of events. He looks like he has more trouble with fluid retention. His heart rate is elevated and he is known to have atrial fibrillation. He may be septic as his blood pressure has been somewhat low but this is also almost  immediately after intubation and starting on sedation.  He has severe COPD at baseline with chronic hypoxic respiratory failure.  He has not had his left ventricular function assessed recently and is going  to have an echocardiogram  His blood sugar has been elevated although it was improving and he will be on every 4 hours blood sugar measurements with sliding scale coverage  His renal function doesn't look quite as good as yesterday and his potassium level is slightly elevated Principal Problem:   Decompensated COPD with exacerbation (chronic obstructive pulmonary disease) (HCC) Active Problems:   Hyperlipidemia   Type 2 diabetes mellitus without complication (HCC)   Cavitary lesion of lung   Acute on chronic respiratory failure (HCC)   COPD exacerbation ([place])    Plan: Start him back on IV antibiotics. Back on IV steroids. IV diuretics. Check cultures. Check BNP. Echocardiogram. Lactic acid. Blood cultures 2. He is obviously critically ill with multisystem failure. Total critical care time 1 hour Discussed with his wife.   LOS: 10 days   Ryan [name] L 07/25/2015, 8:22 AM

## 2015-07-25 NOTE — Progress Notes (Signed)
Pt c/o not being able to breath. RT called. Pt put on nonrebreather and sats in the 90s. Then put back on venti mask. Wife at bedside. Pts breathing very rapid.  This was after pt stood to be weighed.

## 2015-07-25 NOTE — Progress Notes (Signed)
Reviewed MRSA positive handout with family. Schonewitz, Eulis Canner 07/25/2015

## 2015-07-25 NOTE — Progress Notes (Addendum)
Initial Nutrition Assessment   INTERVENTION:  If pt is unable to wean and stable to start nutrition support: Recommend initiate Osmolite 1.5 @ 20 ml/hr via OGT and increase by 10 ml every 4 hours to goal rate of 50  ml/hr.   30 ml Prostat daily.    Tube feeding regimen provides 1900 kcal (100% of needs), 90 grams of protein, and 914 ml of H2O.     NUTRITION DIAGNOSIS:   Inadequate oral intake related to inability to eat as evidenced by NPO status.   GOAL:   Provide needs based on ASPEN/SCCM guidelines   MONITOR:   Vent status, Weight trends, Labs, I & O's  REASON FOR ASSESSMENT:   Ventilator    ASSESSMENT: Pt has hx of severe COPD. He apparently was progressing toward discharge and became short of breath and is currently intubated on ventilator support.  He has been eating well per his wife and meal intake records reflect same. His weight is 10% above his usual weight of 65 kg. Likely fluid related (3+ right and left lower extremity edema) per nursing.  MV: 12.4 L/min Temp (24hrs), Avg:98 F (36.7 C), Min:98 F (36.7 C), Max:98.1 F (36.7 C)  Fentanyl for sedation.   Labs: Potassium 5.6, BUN 33, Glucose 176  Diet Order:  Diet NPO time specified  Skin:   dry, intact  Last BM:    07/23/15  Height:   Ht Readings from Last 1 Encounters:  07/15/15 '5\' 8"'$  (1.727 m)    Weight:   Wt Readings from Last 1 Encounters:  07/25/15 159 lb 8 oz (72.349 kg)  UBW-65 kg  Ideal Body Weight:  70 kg  BMI:  Body mass index is 24.26 kg/(m^2). skewed with increased edema  Estimated Nutritional Needs:   Kcal:   1883  Protein:   87 gr   Fluid:   1.9 normal needs  EDUCATION NEEDS:   No education needs identified at this time  Colman Cater MS,RD,CSG,LDN Office: #[phone] Pager: [phone]

## 2015-07-26 ENCOUNTER — Inpatient Hospital Stay (HOSPITAL_COMMUNITY): Payer: Medicare Other

## 2015-07-26 DIAGNOSIS — A419 Sepsis, unspecified organism: Secondary | ICD-10-CM | POA: Diagnosis not present

## 2015-07-26 DIAGNOSIS — J181 Lobar pneumonia, unspecified organism: Secondary | ICD-10-CM | POA: Diagnosis not present

## 2015-07-26 DIAGNOSIS — Y95 Nosocomial condition: Secondary | ICD-10-CM

## 2015-07-26 DIAGNOSIS — J189 Pneumonia, unspecified organism: Secondary | ICD-10-CM | POA: Diagnosis not present

## 2015-07-26 LAB — BLOOD GAS, ARTERIAL
ACID-BASE EXCESS: 8.5 mmol/L — AB (ref 0.0–2.0)
ACID-BASE EXCESS: 10.8 mmol/L — AB (ref 0.0–2.0)
BICARBONATE: 32.3 meq/L — AB (ref 20.0–24.0)
Bicarbonate: 34.3 mEq/L — ABNORMAL HIGH (ref 20.0–24.0)
DRAWN BY: 38235
Drawn by: 277331
FIO2: 0.5
FIO2: 0.4
MECHVT: 520 mL
O2 SAT: 92.2 %
O2 Saturation: 86.2 %
PEEP/CPAP: 5 cmH2O
PEEP: 5 cmH2O
PH ART: 7.544 — AB (ref 7.350–7.450)
PRESSURE SUPPORT: 5 cmH2O
Patient temperature: 37
Patient temperature: 37
RATE: 14 resp/min
pCO2 arterial: 38.4 mmHg (ref 35.0–45.0)
pCO2 arterial: 39.8 mmHg (ref 35.0–45.0)
pH, Arterial: 7.529 — ABNORMAL HIGH (ref 7.350–7.450)
pO2, Arterial: 65.1 mmHg — ABNORMAL LOW (ref 80.0–100.0)
pO2, Arterial: 54.2 mmHg — ABNORMAL LOW (ref 80.0–100.0)

## 2015-07-26 LAB — GLUCOSE, RANDOM: GLUCOSE: 482 mg/dL — AB (ref 65–99)

## 2015-07-26 LAB — BASIC METABOLIC PANEL
ANION GAP: 11 (ref 5–15)
Anion gap: 11 (ref 5–15)
BUN: 39 mg/dL — AB (ref 6–20)
BUN: 37 mg/dL — ABNORMAL HIGH (ref 6–20)
CHLORIDE: 101 mmol/L (ref 101–111)
CO2: 27 mmol/L (ref 22–32)
CO2: 33 mmol/L — AB (ref 22–32)
CREATININE: 1.4 mg/dL — AB (ref 0.61–1.24)
Calcium: 7.4 mg/dL — ABNORMAL LOW (ref 8.9–10.3)
Calcium: 7.9 mg/dL — ABNORMAL LOW (ref 8.9–10.3)
Chloride: 96 mmol/L — ABNORMAL LOW (ref 101–111)
Creatinine, Ser: 1.16 mg/dL (ref 0.61–1.24)
GFR calc Af Amer: >60 mL/min (ref >60)
GFR calc non Af Amer: 55 mL/min — ABNORMAL LOW (ref >60)
GFR calc non Af Amer: >60 mL/min (ref >60)
GLUCOSE: 80 mg/dL (ref 65–99)
GLUCOSE: 248 mg/dL — AB (ref 65–99)
POTASSIUM: 4.9 mmol/L (ref 3.5–5.1)
POTASSIUM: 4.3 mmol/L (ref 3.5–5.1)
Sodium: 139 mmol/L (ref 135–145)
Sodium: 140 mmol/L (ref 135–145)

## 2015-07-26 LAB — CBC WITH DIFFERENTIAL/PLATELET
BASOS ABS: 0 10*3/uL (ref 0.0–0.1)
BASOS PCT: 0 %
Eosinophils Absolute: 0 10*3/uL (ref 0.0–0.7)
Eosinophils Relative: 0 %
HEMATOCRIT: 32 % — AB (ref 39.0–52.0)
HEMOGLOBIN: 10.1 g/dL — AB (ref 13.0–17.0)
LYMPHS ABS: 1.2 10*3/uL (ref 0.7–4.0)
Lymphocytes Relative: 5 %
MCH: 23.4 pg — ABNORMAL LOW (ref 26.0–34.0)
MCHC: 31.6 g/dL (ref 30.0–36.0)
MCV: 74.2 fL — AB (ref 78.0–100.0)
MONO ABS: 1.3 10*3/uL — AB (ref 0.1–1.0)
Monocytes Relative: 6 %
Neutro Abs: 19.5 10*3/uL — ABNORMAL HIGH (ref 1.7–7.7)
Neutrophils Relative %: 89 %
Platelets: 296 10*3/uL (ref 150–400)
RBC: 4.31 MIL/uL (ref 4.22–5.81)
RDW: 17.8 % — ABNORMAL HIGH (ref 11.5–15.5)
WBC: 22 10*3/uL — ABNORMAL HIGH (ref 4.0–10.5)

## 2015-07-26 LAB — GLUCOSE, CAPILLARY
GLUCOSE-CAPILLARY: 246 mg/dL — AB (ref 65–99)
GLUCOSE-CAPILLARY: 108 mg/dL — AB (ref 65–99)
GLUCOSE-CAPILLARY: 447 mg/dL — AB (ref 65–99)
Glucose-Capillary: 202 mg/dL — ABNORMAL HIGH (ref 65–99)

## 2015-07-26 LAB — TRIGLYCERIDES: TRIGLYCERIDES: 182 mg/dL — AB (ref <150)

## 2015-07-26 IMAGING — CR DG CHEST 2V
2 series · 2 of 2 positions shown · non-contrast
Comparison: 12/15/2013

CLINICAL DATA: Productive cough for 3 weeks

EXAM:
CHEST  2 VIEW

[view not recorded (1 of 2)]
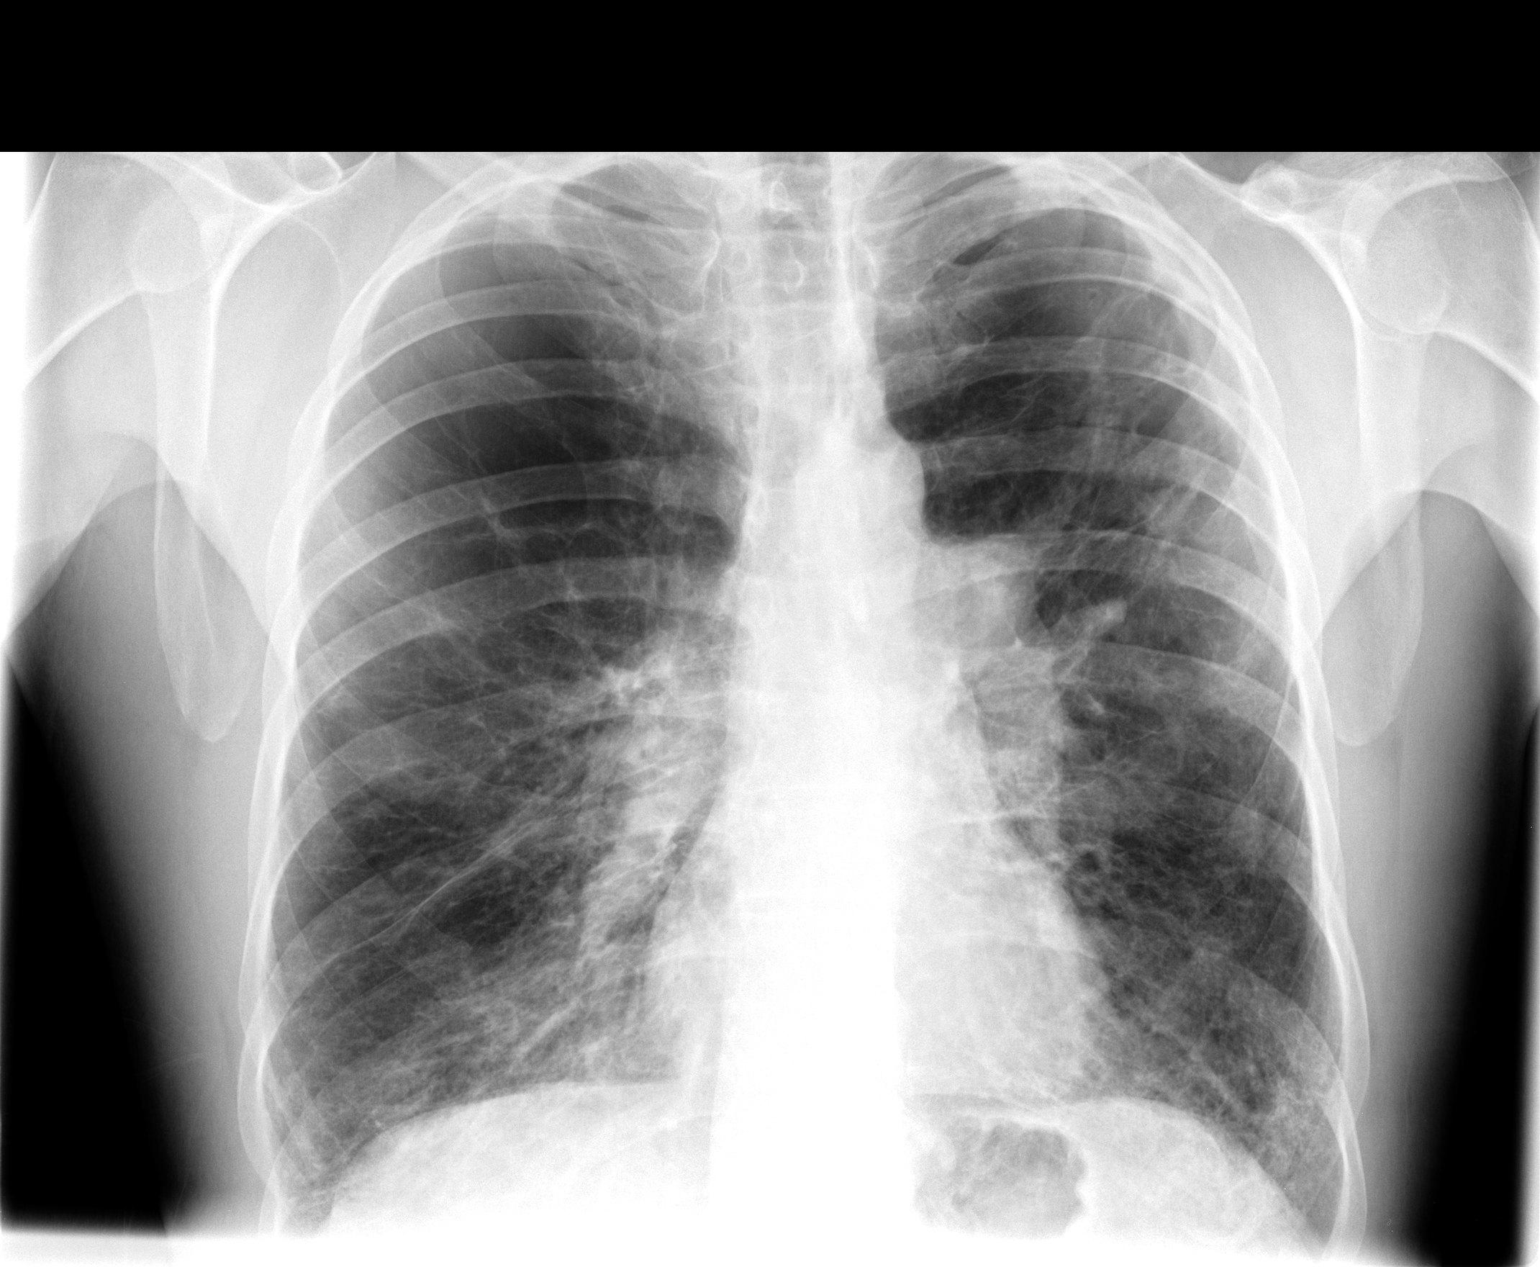

[view not recorded (2 of 2)]
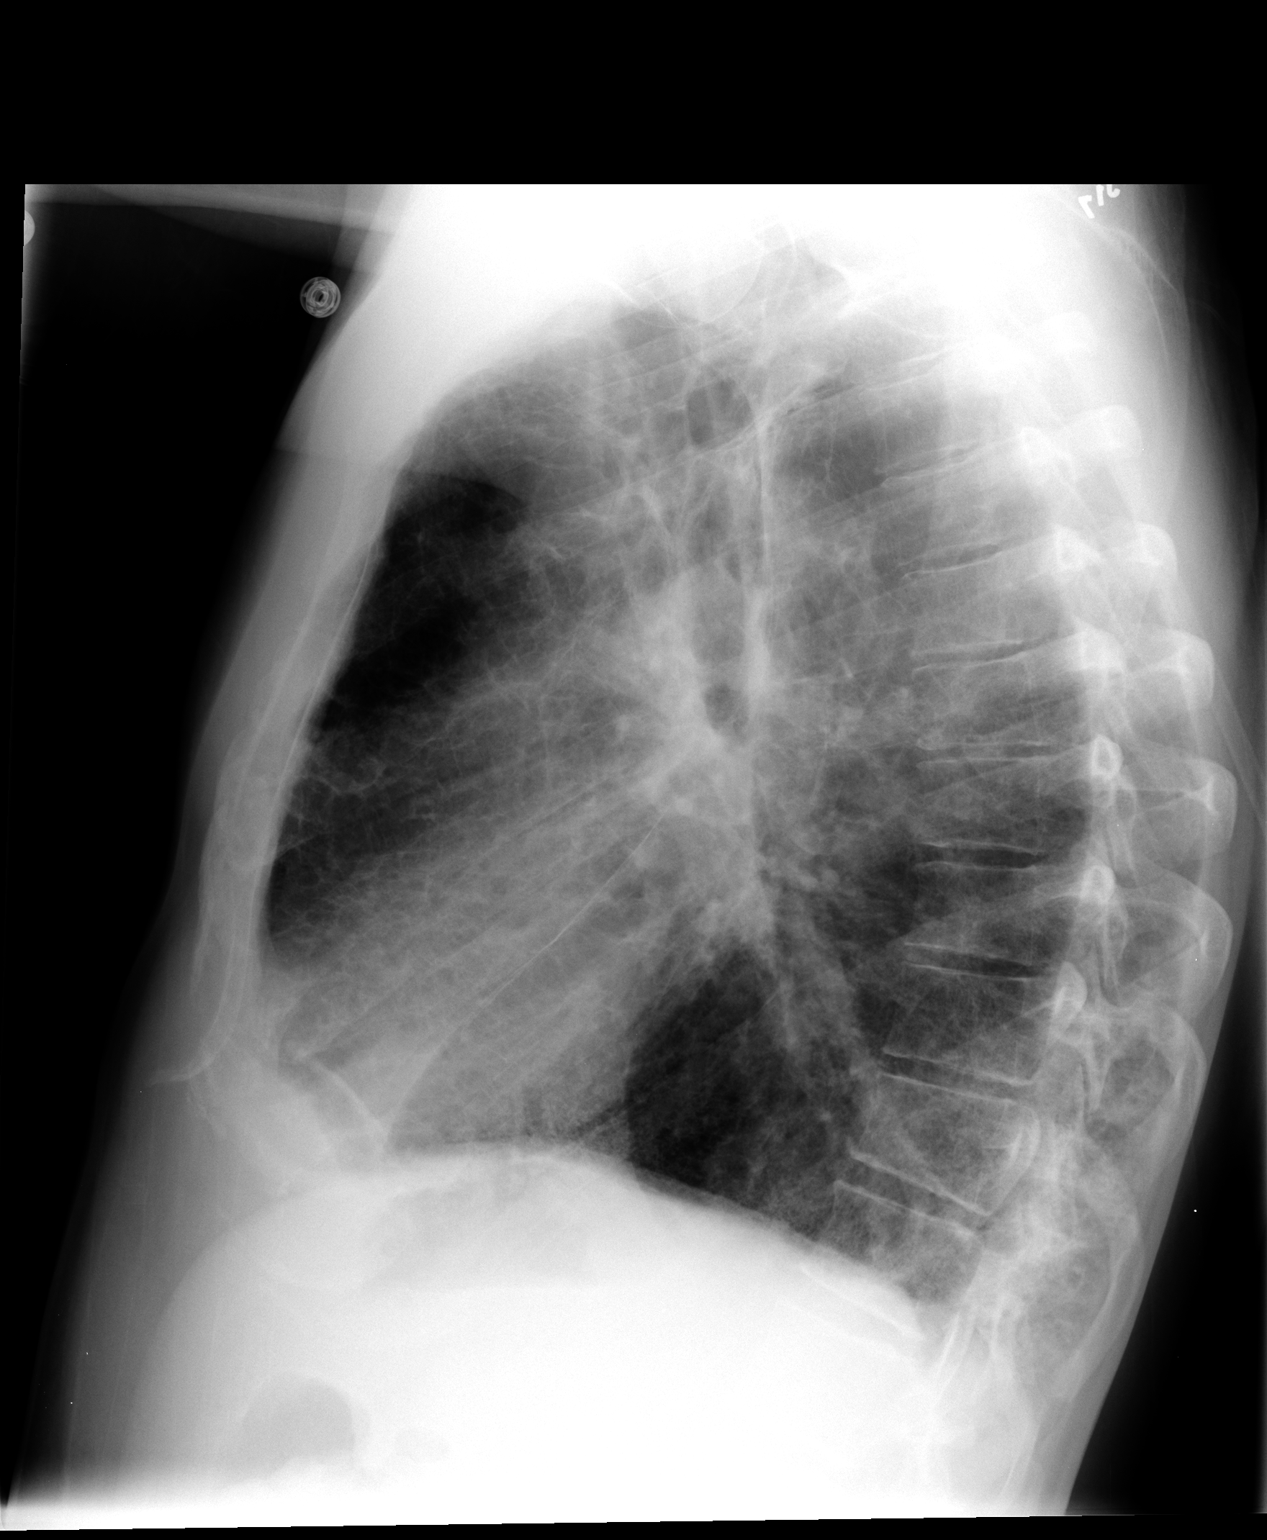

[2 of 2 positions shown; findings below may reference images not displayed]

FINDINGS: Cardiac shadow is within normal limits. Stable interstitial changes
are noted throughout both lungs. The known left upper lobe cavitary
lesion is again seen. The wall appears slightly less prominent. No
new focal infiltrate or effusion is noted. No acute bony abnormality
is seen.
IMPRESSION: Chronic changes without acute abnormality.

Stable left upper lobe cavitary lesion.

## 2015-07-26 MED ORDER — LORAZEPAM 2 MG/ML IJ SOLN
0.5000 mg | INTRAMUSCULAR | Status: DC | PRN
  Administered 2015-07-26: 0.5 mg via INTRAVENOUS
  Filled 2015-07-26: qty 1

## 2015-07-26 MED ORDER — MORPHINE SULFATE (PF) 2 MG/ML IV SOLN
2.0000 mg | INTRAVENOUS | Status: DC | PRN
  Administered 2015-07-26 – 2015-07-30 (×16): 2 mg via INTRAVENOUS
  Filled 2015-07-26 (×17): qty 1

## 2015-07-26 NOTE — Progress Notes (Signed)
Wasted 210cc of Fentanyl in sink with Deno Etienne, RN. Schonewitz, Eulis Canner 07/26/2015 12:47 PM

## 2015-07-26 NOTE — Care Management Note (Signed)
Case Management Note  Patient Details  Name: Ryan [name] MRN: [id number] Date of Birth: 1961-01-20   Expected Discharge Date:                  Expected Discharge Plan:  [place]  In-House Referral:  NA  Discharge planning Services  CM Consult  Post Acute Care Choice:  Home Health Choice offered to:  Patient  DME Arranged:    DME Agency:     HH Arranged:  RN, PT [place] Agency:  [place]  Status of Service:  In process, will continue to follow  Medicare Important Message Given:  Yes Date Medicare IM Given:    Medicare IM give by:    Date Additional Medicare IM Given:    Additional Medicare Important Message give by:     If discussed at [place] of Stay Meetings, dates discussed:  07/26/2015  Additional Comments:  Sherald Barge, RN 07/26/2015, 3:37 PM

## 2015-07-26 NOTE — Procedures (Signed)
Extubation Procedure Note  Patient Details:   Name: Ryan [name] DOB: 12-09-1960 MRN: [id number]   Airway Documentation:  Airway (Active)  Secured at (cm) 24 cm 07/26/2015 11:20 AM  Measured From Lips 07/26/2015 11:20 AM  [place] 07/26/2015 11:20 AM  Secured By [employer] 07/26/2015 11:20 AM  Tube Holder Repositioned Yes 07/26/2015  8:15 AM  Cuff Pressure (cm H2O) 28 cm H2O 07/26/2015 11:20 AM  Site Condition Dry 07/26/2015 11:20 AM    Evaluation  O2 sats: stable throughout Complications: No apparent complications Patient did tolerate procedure well. Bilateral Breath Sounds: Diminished Suctioning: Oral, Airway Yes  Pete Pelt 07/26/2015, 12:50 PM

## 2015-07-26 NOTE — Progress Notes (Signed)
Inpatient Diabetes Program Recommendations  AACE/ADA: New Consensus Statement on Inpatient Glycemic Control (2015)  Target Ranges:  Prepandial:   less than 140 mg/dL      Peak postprandial:   less than 180 mg/dL (1-2 hours)      Critically ill patients:  140 - 180 mg/dL  Results for Ryan [name], Ryan [name] (MRN [id number]) as of 07/26/2015 14:08  Ref. Range 07/25/2015 07:56 07/25/2015 11:27 07/25/2015 16:48 07/25/2015 21:42 07/26/2015 07:49 07/26/2015 11:39  Glucose-Capillary Latest Ref Range: 65-99 mg/dL 122 (H) 85 116 (H) 169 (H) 246 (H) 202 (H)   Review of Glycemic Control  Current orders for Inpatient glycemic control: Novolog 0-20 units TID with meals, Novolog 0-5 units HS  Inpatient Diabetes Program Recommendations: Insulin - Basal: Please consider ordering basal insulin. Recommend ordering at least Lantus 15 units QHS.  Thanks, Barnie Alderman, RN, MSN, CDE Diabetes Coordinator Inpatient Diabetes Program [phone] (Team Pager from [place] to [place]) [phone] ([place] office) [phone] [facility] office) [phone] [facility] office)

## 2015-07-26 NOTE — Progress Notes (Signed)
Subjective: He looks much better. He is still intubated and on the ventilator but is awake and writing notes. He writes that he would like to have the tube out. He is now on 45% oxygen. He looks comfortable. His blood pressure is much better  Objective: Vital signs in last 24 hours: Temp:  [97.7 F (36.5 C)-98.3 F (36.8 C)] 97.8 F (36.6 C) (01/12 0400) Pulse Rate:  [67-125] 80 (01/12 0500) Resp:  [13-39] 14 (01/12 0500) BP: (70-132)/(52-89) 111/74 mmHg (01/12 0500) SpO2:  [85 %-97 %] 90 % (01/12 0502) FiO2 (%):  [45 %-50 %] 50 % (01/12 0502) Weight:  [72.3 kg (159 lb 6.3 oz)] 72.3 kg (159 lb 6.3 oz) (01/12 0500) Weight change: -0.049 kg (-1.7 oz) Last BM Date: 07/24/15  Intake/Output from previous day: 01/11 0701 - 01/12 0700 In: 600 [IV Piggyback:600] Out: 4950 [Urine:4950]  PHYSICAL EXAM General appearance: alert, cooperative, mild distress and Intubated and on mechanical ventilation Resp: He still has some rales on the right but his wheezing is less and he looks much more comfortable and is not tachypneic Cardio: irregularly irregular rhythm GI: soft, non-tender; bowel sounds normal; no masses,  no organomegaly Extremities: He still has significant edema but it is less.  Lab Results:  Results for orders placed or performed during the hospital encounter of 07/15/15 (from the past 48 hour(s))  Glucose, capillary     Status: Abnormal   Collection Time: 07/24/15 11:03 AM  Result Value Ref Range   Glucose-Capillary 300 (H) 65 - 99 mg/dL   Comment 1 Notify RN    Comment 2 Document in Chart   Glucose, capillary     Status: Abnormal   Collection Time: 07/24/15  4:47 PM  Result Value Ref Range   Glucose-Capillary 136 (H) 65 - 99 mg/dL   Comment 1 Notify RN    Comment 2 Document in Chart   Glucose, capillary     Status: Abnormal   Collection Time: 07/24/15  9:00 PM  Result Value Ref Range   Glucose-Capillary 211 (H) 65 - 99 mg/dL  Basic metabolic panel     Status: Abnormal    Collection Time: 07/25/15  6:11 AM  Result Value Ref Range   Sodium 138 135 - 145 mmol/L   Potassium 5.6 (H) 3.5 - 5.1 mmol/L   Chloride 95 (L) 101 - 111 mmol/L   CO2 26 22 - 32 mmol/L   Glucose, Bld 176 (H) 65 - 99 mg/dL   BUN 33 (H) 6 - 20 mg/dL   Creatinine, Ser 1.24 0.61 - 1.24 mg/dL   Calcium 9.0 8.9 - 10.3 mg/dL   GFR calc non Af Amer >60 >60 mL/min   GFR calc Af Amer >60 >60 mL/min    Comment: (NOTE) The eGFR has been calculated using the CKD EPI equation. This calculation has not been validated in all clinical situations. eGFR's persistently <60 mL/min signify possible Chronic Kidney Disease.    Anion gap 17 (H) 5 - 15  Blood gas, arterial     Status: Abnormal   Collection Time: 07/25/15  6:16 AM  Result Value Ref Range   FIO2 1.00    Delivery systems NON-REBREATHER OXYGEN MASK    pH, Arterial 7.409 7.350 - 7.450   pCO2 arterial 43.7 35.0 - 45.0 mmHg   pO2, Arterial 90.5 80.0 - 100.0 mmHg   Bicarbonate 26.6 (H) 20.0 - 24.0 mEq/L   TCO2 16.6 0 - 100 mmol/L   Acid-Base Excess 2.8 (H) 0.0 -  2.0 mmol/L   O2 Saturation 95.1 %   Collection site RIGHT RADIAL    Drawn by [phone]    Sample type ARTERIAL DRAW    Allens test (pass/fail) PASS PASS  MRSA PCR Screening     Status: Abnormal   Collection Time: 07/25/15  7:40 AM  Result Value Ref Range   MRSA by PCR POSITIVE (A) NEGATIVE    Comment: RESULT CALLED TO, READ BACK BY AND VERIFIED WITH: SCHENOWITZ,L AT 1023 BY HUFFINES,S ON 07/25/15.        The GeneXpert MRSA Assay (FDA approved for NASAL specimens only), is one component of a comprehensive MRSA colonization surveillance program. It is not intended to diagnose MRSA infection nor to guide or monitor treatment for MRSA infections.   Glucose, capillary     Status: Abnormal   Collection Time: 07/25/15  7:56 AM  Result Value Ref Range   Glucose-Capillary 122 (H) 65 - 99 mg/dL   Comment 1 Notify RN    Comment 2 Document in Chart   CBC with  Differential/Platelet     Status: Abnormal   Collection Time: 07/25/15  8:28 AM  Result Value Ref Range   WBC 38.6 (H) 4.0 - 10.5 K/uL   RBC 5.29 4.22 - 5.81 MIL/uL   Hemoglobin 12.6 (L) 13.0 - 17.0 g/dL   HCT 40.0 39.0 - 52.0 %   MCV 75.6 (L) 78.0 - 100.0 fL   MCH 23.8 (L) 26.0 - 34.0 pg   MCHC 31.5 30.0 - 36.0 g/dL   RDW 17.6 (H) 11.5 - 15.5 %   Platelets 362 150 - 400 K/uL    Comment: SPECIMEN CHECKED FOR CLOTS PLATELET COUNT CONFIRMED BY SMEAR    Neutrophils Relative % 78 %   Lymphocytes Relative 17 %   Monocytes Relative 3 %   Eosinophils Relative 0 %   Basophils Relative 0 %   Band Neutrophils 2 %   Metamyelocytes Relative 0 %   Myelocytes 0 %   Promyelocytes Absolute 0 %   Blasts 0 %   nRBC 0 0 /100 WBC   Other 0 %   Neutro Abs 30.8 (H) 1.7 - 7.7 K/uL   Lymphs Abs 6.6 (H) 0.7 - 4.0 K/uL   Monocytes Absolute 1.2 (H) 0.1 - 1.0 K/uL   Eosinophils Absolute 0.0 0.0 - 0.7 K/uL   Basophils Absolute 0.0 0.0 - 0.1 K/uL   WBC Morphology ATYPICAL LYMPHOCYTES     Comment: HYPERSEGMENTED NEUT  Troponin I (q 6hr x 3)     Status: Abnormal   Collection Time: 07/25/15  8:28 AM  Result Value Ref Range   Troponin I 0.05 (H) <0.031 ng/mL    Comment:        PERSISTENTLY INCREASED TROPONIN VALUES IN THE RANGE OF 0.04-0.49 ng/mL CAN BE SEEN IN:       -UNSTABLE ANGINA       -CONGESTIVE HEART FAILURE       -MYOCARDITIS       -CHEST TRAUMA       -ARRYHTHMIAS       -LATE PRESENTING MYOCARDIAL INFARCTION       -COPD   CLINICAL FOLLOW-UP RECOMMENDED.   Brain natriuretic peptide     Status: Abnormal   Collection Time: 07/25/15  8:28 AM  Result Value Ref Range   B Natriuretic Peptide 113.0 (H) 0.0 - 100.0 pg/mL  Lactic acid, plasma     Status: Abnormal   Collection Time: 07/25/15  8:28 AM  Result Value  Ref Range   Lactic Acid, Venous 4.2 (HH) 0.5 - 2.0 mmol/L    Comment: CRITICAL RESULT CALLED TO, READ BACK BY AND VERIFIED WITH: CHENOWITZ,L AT 9:10AM ON 07/25/15 BY [facility]    Hepatic function panel     Status: Abnormal   Collection Time: 07/25/15  8:28 AM  Result Value Ref Range   Total Protein 6.4 (L) 6.5 - 8.1 g/dL   Albumin 2.6 (L) 3.5 - 5.0 g/dL   AST 51 (H) 15 - 41 U/L   ALT 64 (H) 17 - 63 U/L   Alkaline Phosphatase 73 38 - 126 U/L   Total Bilirubin 0.9 0.3 - 1.2 mg/dL   Bilirubin, Direct 0.3 0.1 - 0.5 mg/dL   Indirect Bilirubin 0.6 0.3 - 0.9 mg/dL  Culture, blood (Routine X 2) w Reflex to ID Panel     Status: None (Preliminary result)   Collection Time: 07/25/15  8:30 AM  Result Value Ref Range   Specimen Description BLOOD LEFT ARM    Special Requests BOTTLES DRAWN AEROBIC AND ANAEROBIC 10 CC EACH    Culture  Setup Time      GRAM POSITIVE COCCI IN CLUSTERS Gram Stain Report Called to,Read Back By and Verified With: HEARNS J AT 0425 ON [id number] BY FORSYTH K    Culture PENDING    Report Status PENDING   Culture, blood (Routine X 2) w Reflex to ID Panel     Status: None (Preliminary result)   Collection Time: 07/25/15  8:35 AM  Result Value Ref Range   Specimen Description BLOOD LEFT HAND    Special Requests BOTTLES DRAWN AEROBIC AND ANAEROBIC 10 CC EACH    Culture  Setup Time      GRAM POSITIVE COCCI IN CLUSTERS Gram Stain Report Called to,Read Back By and Verified With: HEARN J AT 0425 ON [id number] BY FORSYTH K    Culture PENDING    Report Status PENDING   Glucose, capillary     Status: None   Collection Time: 07/25/15 11:27 AM  Result Value Ref Range   Glucose-Capillary 85 65 - 99 mg/dL   Comment 1 Notify RN    Comment 2 Document in Chart   Draw ABG 1 hour after initiation of ventilator     Status: Abnormal   Collection Time: 07/25/15 11:48 AM  Result Value Ref Range   FIO2 40.00    Delivery systems VENTILATOR    Mode PRESSURE REGULATED VOLUME CONTROL    VT 520 mL   LHR 14 resp/min   Peep/cpap 5.0 cm H20   pH, Arterial 7.424 7.350 - 7.450   pCO2 arterial 42.8 35.0 - 45.0 mmHg   pO2, Arterial 72.9 (L) 80.0 - 100.0 mmHg   Bicarbonate  27.2 (H) 20.0 - 24.0 mEq/L   Acid-Base Excess 3.4 (H) 0.0 - 2.0 mmol/L   O2 Saturation 92.4 %   Collection site LEFT RADIAL    Drawn by [id number]    Sample type ARTERIAL    Allens test (pass/fail) PASS PASS  Troponin I (q 6hr x 3)     Status: Abnormal   Collection Time: 07/25/15  2:13 PM  Result Value Ref Range   Troponin I 0.04 (H) <0.031 ng/mL    Comment:        PERSISTENTLY INCREASED TROPONIN VALUES IN THE RANGE OF 0.04-0.49 ng/mL CAN BE SEEN IN:       -UNSTABLE ANGINA       -CONGESTIVE HEART FAILURE       -MYOCARDITIS       -  CHEST TRAUMA       -ARRYHTHMIAS       -LATE PRESENTING MYOCARDIAL INFARCTION       -COPD   CLINICAL FOLLOW-UP RECOMMENDED.   Basic metabolic panel     Status: Abnormal   Collection Time: 07/25/15  2:13 PM  Result Value Ref Range   Sodium 139 135 - 145 mmol/L   Potassium 4.9 3.5 - 5.1 mmol/L   Chloride 101 101 - 111 mmol/L   CO2 27 22 - 32 mmol/L   Glucose, Bld 80 65 - 99 mg/dL   BUN 39 (H) 6 - 20 mg/dL   Creatinine, Ser 1.40 (H) 0.61 - 1.24 mg/dL   Calcium 7.4 (L) 8.9 - 10.3 mg/dL   GFR calc non Af Amer 55 (L) >60 mL/min   GFR calc Af Amer >60 >60 mL/min    Comment: (NOTE) The eGFR has been calculated using the CKD EPI equation. This calculation has not been validated in all clinical situations. eGFR's persistently <60 mL/min signify possible Chronic Kidney Disease.    Anion gap 11 5 - 15  Glucose, capillary     Status: Abnormal   Collection Time: 07/25/15  4:48 PM  Result Value Ref Range   Glucose-Capillary 116 (H) 65 - 99 mg/dL   Comment 1 Notify RN    Comment 2 Document in Chart   Troponin I (q 6hr x 3)     Status: None   Collection Time: 07/25/15  7:49 PM  Result Value Ref Range   Troponin I <0.03 <0.031 ng/mL    Comment:        NO INDICATION OF MYOCARDIAL INJURY.   Glucose, capillary     Status: Abnormal   Collection Time: 07/25/15  9:42 PM  Result Value Ref Range   Glucose-Capillary 169 (H) 65 - 99 mg/dL   Comment 1 Notify RN    CBC with Differential/Platelet     Status: Abnormal   Collection Time: 07/26/15  4:40 AM  Result Value Ref Range   WBC 22.0 (H) 4.0 - 10.5 K/uL   RBC 4.31 4.22 - 5.81 MIL/uL   Hemoglobin 10.1 (L) 13.0 - 17.0 g/dL   HCT 32.0 (L) 39.0 - 52.0 %   MCV 74.2 (L) 78.0 - 100.0 fL   MCH 23.4 (L) 26.0 - 34.0 pg   MCHC 31.6 30.0 - 36.0 g/dL   RDW 17.8 (H) 11.5 - 15.5 %   Platelets 296 150 - 400 K/uL   Neutrophils Relative % 89 %   Neutro Abs 19.5 (H) 1.7 - 7.7 K/uL   Lymphocytes Relative 5 %   Lymphs Abs 1.2 0.7 - 4.0 K/uL   Monocytes Relative 6 %   Monocytes Absolute 1.3 (H) 0.1 - 1.0 K/uL   Eosinophils Relative 0 %   Eosinophils Absolute 0.0 0.0 - 0.7 K/uL   Basophils Relative 0 %   Basophils Absolute 0.0 0.0 - 0.1 K/uL  Basic metabolic panel     Status: Abnormal   Collection Time: 07/26/15  4:40 AM  Result Value Ref Range   Sodium 140 135 - 145 mmol/L   Potassium 4.3 3.5 - 5.1 mmol/L   Chloride 96 (L) 101 - 111 mmol/L   CO2 33 (H) 22 - 32 mmol/L   Glucose, Bld 248 (H) 65 - 99 mg/dL   BUN 37 (H) 6 - 20 mg/dL   Creatinine, Ser 1.16 0.61 - 1.24 mg/dL   Calcium 7.9 (L) 8.9 - 10.3 mg/dL   GFR calc non  Af Amer >60 >60 mL/min   GFR calc Af Amer >60 >60 mL/min    Comment: (NOTE) The eGFR has been calculated using the CKD EPI equation. This calculation has not been validated in all clinical situations. eGFR's persistently <60 mL/min signify possible Chronic Kidney Disease.    Anion gap 11 5 - 15  Triglycerides     Status: Abnormal   Collection Time: 07/26/15  4:40 AM  Result Value Ref Range   Triglycerides 182 (H) <150 mg/dL  Blood gas, arterial     Status: Abnormal   Collection Time: 07/26/15  5:30 AM  Result Value Ref Range   FIO2 0.50    Delivery systems VENTILATOR    Mode PRESSURE REGULATED VOLUME CONTROL    VT 520 mL   LHR 14 resp/min   Peep/cpap 5.0 cm H20   pH, Arterial 7.529 (H) 7.350 - 7.450   pCO2 arterial 38.4 35.0 - 45.0 mmHg   pO2, Arterial 65.1 (L) 80.0 - 100.0  mmHg   Bicarbonate 32.3 (H) 20.0 - 24.0 mEq/L   Acid-Base Excess 8.5 (H) 0.0 - 2.0 mmol/L   O2 Saturation 92.2 %   Patient temperature 37.0    Collection site LEFT RADIAL    Drawn by [id number]    Sample type ARTERIAL DRAW    Allens test (pass/fail) PASS PASS    ABGS  Recent Labs  07/25/15 0616  07/26/15 0530  PHART 7.409  < > 7.529*  PO2ART 90.5  < > 65.1*  TCO2 16.6  --   --   HCO3 26.6*  < > 32.3*  < > = values in this interval not displayed. CULTURES Recent Results (from the past 240 hour(s))  MRSA PCR Screening     Status: Abnormal   Collection Time: 07/25/15  7:40 AM  Result Value Ref Range Status   MRSA by PCR POSITIVE (A) NEGATIVE Final    Comment: RESULT CALLED TO, READ BACK BY AND VERIFIED WITH: SCHENOWITZ,L AT 1023 BY HUFFINES,S ON 07/25/15.        The GeneXpert MRSA Assay (FDA approved for NASAL specimens only), is one component of a comprehensive MRSA colonization surveillance program. It is not intended to diagnose MRSA infection nor to guide or monitor treatment for MRSA infections.   Culture, blood (Routine X 2) w Reflex to ID Panel     Status: None (Preliminary result)   Collection Time: 07/25/15  8:30 AM  Result Value Ref Range Status   Specimen Description BLOOD LEFT ARM  Final   Special Requests BOTTLES DRAWN AEROBIC AND ANAEROBIC 10 CC EACH  Final   Culture  Setup Time   Final    GRAM POSITIVE COCCI IN CLUSTERS Gram Stain Report Called to,Read Back By and Verified With: HEARNS J AT 0425 ON [id number] BY FORSYTH K    Culture PENDING  Incomplete   Report Status PENDING  Incomplete  Culture, blood (Routine X 2) w Reflex to ID Panel     Status: None (Preliminary result)   Collection Time: 07/25/15  8:35 AM  Result Value Ref Range Status   Specimen Description BLOOD LEFT HAND  Final   Special Requests BOTTLES DRAWN AEROBIC AND ANAEROBIC 10 CC EACH  Final   Culture  Setup Time   Final    GRAM POSITIVE COCCI IN CLUSTERS Gram Stain Report Called to,Read  Back By and Verified With: HEARN J AT 0425 ON [id number] BY FORSYTH K    Culture PENDING  Incomplete   Report Status PENDING  Incomplete   Studies/Results: Dg Chest Port 1 View  07/25/2015  CLINICAL DATA:  Intubated patient, shortness of breath EXAM: PORTABLE CHEST 1 VIEW COMPARISON:  Chest x-ray of July 15, 2015, and chest CT scan dated May 01, 2015. FINDINGS: The lungs are well-expanded. There is confluent interstitial infiltrate in the right lower lobe. The right hemidiaphragm is now obscured. There is patchy interstitial density at the left lung base which is stable. The heart is normal in size. The pulmonary vascularity is prominent centrally but there is no pulmonary vascular congestion. The endotracheal tube tip lies approximately 5.7 cm above the carina. IMPRESSION: Bullous emphysema. Worsening of interstitial infiltrate in the right lower lung likely reflecting pneumonia. Stable interstitial density at the left lung base. The endotracheal tube is in reasonable position. Electronically Signed   By: Eduard  Martinique M.D.   On: 07/25/2015 08:16    Medications:  Prior to Admission:  Prescriptions prior to admission  Medication Sig Dispense Refill Last Dose  . albuterol (VENTOLIN HFA) 108 (90 BASE) MCG/ACT inhaler Inhale 2 puffs into the lungs every 6 (six) hours as needed for wheezing or shortness of breath.   Past Week  . benzonatate (TESSALON) 100 MG capsule Take 1 capsule (100 mg total) by mouth 3 (three) times daily as needed for cough. 30 capsule 0 07/15/2015  . budesonide-formoterol (SYMBICORT) 160-4.5 MCG/ACT inhaler Inhale 2 puffs into the lungs 2 (two) times daily.   07/14/2015  . Dextromethorphan-Guaifenesin 20-400 MG TABS Take 1 tablet by mouth every 6 (six) hours as needed (mucus).   Past Week  . diltiazem (CARDIZEM CD) 120 MG 24 hr capsule Take 1 capsule (120 mg total) by mouth daily. 30 capsule 11 07/15/2015  . fluticasone (FLONASE) 50 MCG/ACT nasal spray Place 2 sprays into both  nostrils daily.   07/14/2015  . LORazepam (ATIVAN) 0.5 MG tablet Take 1 tablet (0.5 mg total) by mouth 2 (two) times daily as needed for anxiety or sleep. 20 tablet 0 07/15/2015  . metFORMIN (GLUCOPHAGE) 500 MG tablet Take 1 tablet (500 mg total) by mouth daily with breakfast. 30 tablet 3 07/15/2015  . montelukast (SINGULAIR) 10 MG tablet Take 10 mg by mouth at bedtime.   07/14/2015  . oxyCODONE-acetaminophen (PERCOCET) 10-325 MG tablet Take 1 tablet by mouth every 4 (four) hours as needed for pain.   07/14/2015  . oxyCODONE-acetaminophen (ROXICET) 5-325 MG per tablet Take 1 tablet by mouth every 4 (four) hours as needed for severe pain. 25 tablet 0 07/14/2015  . pantoprazole (PROTONIX) 40 MG tablet Take 1 tablet (40 mg total) by mouth 2 (two) times daily. 60 tablet 0 07/15/2015  . promethazine-dextromethorphan (PROMETHAZINE-DM) 6.25-15 MG/5ML syrup Take 5 mLs by mouth 4 (four) times daily as needed for cough.   07/14/2015  . SPIRIVA RESPIMAT 2.5 MCG/ACT AERS Inhale 1 puff into the lungs daily.   07/15/2015  . baclofen (LIORESAL) 10 MG tablet Take 10 mg by mouth 3 (three) times daily as needed.   Unknown  . edoxaban (SAVAYSA) 60 MG TABS tablet Take 60 mg by mouth daily. 30 tablet 11 Couple Weeks  . levalbuterol (XOPENEX) 0.63 MG/3ML nebulizer solution Take 3 mLs (0.63 mg total) by nebulization 3 (three) times daily. (Patient taking differently: Take 0.63 mg by nebulization every 8 (eight) hours as needed for wheezing or shortness of breath. ) 3 mL 12 07/12/2015  . nicotine (NICODERM CQ - DOSED IN MG/24 HOURS) 21 mg/24hr patch Place 1 patch (21 mg total) onto the skin daily.  28 patch 0 Taking  . nitroGLYCERIN (NITROSTAT) 0.4 MG SL tablet Place 1 tablet (0.4 mg total) under the tongue every 5 (five) minutes as needed for chest pain. 25 tablet 4 Never  . predniSONE (DELTASONE) 20 MG tablet Take 2 tablets (40 mg total) by mouth daily. 10 tablet 0    Scheduled: . antiseptic oral rinse  7 mL Mouth Rinse QID  .  chlorhexidine gluconate  15 mL Mouth Rinse BID  . Chlorhexidine Gluconate Cloth  6 each Topical Q0600  . edoxaban  60 mg Oral Daily  . furosemide  80 mg Intravenous Q12H  . insulin aspart  0-20 Units Subcutaneous TID WC  . insulin aspart  0-5 Units Subcutaneous QHS  . ipratropium  0.5 mg Nebulization Q4H  . levalbuterol  0.63 mg Nebulization Q4H  . methylPREDNISolone (SOLU-MEDROL) injection  125 mg Intravenous Q6H  . mupirocin ointment  1 application Nasal BID  . nicotine  14 mg Transdermal Daily  . pantoprazole (PROTONIX) IV  40 mg Intravenous Daily  . piperacillin-tazobactam (ZOSYN)  IV  3.375 g Intravenous Q8H  . sodium chloride  3 mL Intravenous Q12H  . sodium chloride  3 mL Intravenous Q12H  . vancomycin  1,000 mg Intravenous Q12H   Continuous: . fentaNYL infusion INTRAVENOUS 200 mcg/hr (07/26/15 0443)   DTO:IZTIWP chloride, fentaNYL (SUBLIMAZE) injection, fentaNYL, midazolam, midazolam, midazolam, nitroGLYCERIN, [DISCONTINUED] ondansetron **OR** ondansetron (ZOFRAN) IV, sodium chloride  Assesment: He was admitted with COPD exacerbation and acute on chronic respiratory failure. He was improving but developed acute respiratory distress about 36 hours ago. He was transferred to the intensive care unit and required intubation and mechanical ventilation. He has lobar pneumonia. This is in an area where it could be related to aspiration but he does not have any overt aspiration episodes. He was septic yesterday but looks much better now. His white blood count has improved. His blood pressure is back at baseline  He has fluid in his legs but his cardiac function looks normal. This is likely multifactorial but is better  History of troponin level was elevated and this appears to been related to demand ischemia during his sepsis. It has returned to normal over 24 hours  He has diabetes which is been much worse because of his acute illness and steroids  He had acute kidney injury related to  his sepsis and that has improved.  His potassium level was mildly elevated but is back to normal  His albumin is low so he has some element of protein calorie malnutrition Principal Problem:   Decompensated COPD with exacerbation (chronic obstructive pulmonary disease) (HCC) Active Problems:   Hyperlipidemia   Type 2 diabetes mellitus without complication ([place])   Cavitary lesion of lung   Acute on chronic respiratory failure (HCC)   COPD exacerbation ([place])    Plan: See if he can be extubated later today.    LOS: 11 days   Anabella Capshaw L 07/26/2015, 7:36 AM

## 2015-07-26 NOTE — Progress Notes (Signed)
Patient able to perform a vital capacity of 1.35L and NIF -36.

## 2015-07-27 LAB — BASIC METABOLIC PANEL
ANION GAP: 10 (ref 5–15)
BUN: 38 mg/dL — AB (ref 6–20)
CHLORIDE: 94 mmol/L — AB (ref 101–111)
CO2: 38 mmol/L — AB (ref 22–32)
Calcium: 8.6 mg/dL — ABNORMAL LOW (ref 8.9–10.3)
Creatinine, Ser: 1.17 mg/dL (ref 0.61–1.24)
GFR calc Af Amer: >60 mL/min (ref >60)
GFR calc non Af Amer: >60 mL/min (ref >60)
GLUCOSE: 60 mg/dL — AB (ref 65–99)
POTASSIUM: 3.5 mmol/L (ref 3.5–5.1)
Sodium: 142 mmol/L (ref 135–145)

## 2015-07-27 LAB — GLUCOSE, CAPILLARY
GLUCOSE-CAPILLARY: 232 mg/dL — AB (ref 65–99)
GLUCOSE-CAPILLARY: 336 mg/dL — AB (ref 65–99)
Glucose-Capillary: 166 mg/dL — ABNORMAL HIGH (ref 65–99)
Glucose-Capillary: 406 mg/dL — ABNORMAL HIGH (ref 65–99)

## 2015-07-27 LAB — CBC WITH DIFFERENTIAL/PLATELET
Basophils Absolute: 0 10*3/uL (ref 0.0–0.1)
Basophils Relative: 0 %
EOS PCT: 0 %
Eosinophils Absolute: 0 10*3/uL (ref 0.0–0.7)
HEMATOCRIT: 32.9 % — AB (ref 39.0–52.0)
HEMOGLOBIN: 10.4 g/dL — AB (ref 13.0–17.0)
LYMPHS ABS: 1.2 10*3/uL (ref 0.7–4.0)
LYMPHS PCT: 6 %
MCH: 23.3 pg — AB (ref 26.0–34.0)
MCHC: 31.6 g/dL (ref 30.0–36.0)
MCV: 73.8 fL — AB (ref 78.0–100.0)
Monocytes Absolute: 2 10*3/uL — ABNORMAL HIGH (ref 0.1–1.0)
Monocytes Relative: 9 %
NEUTROS ABS: 18.1 10*3/uL — AB (ref 1.7–7.7)
Neutrophils Relative %: 85 %
PLATELETS: 297 10*3/uL (ref 150–400)
RBC: 4.46 MIL/uL (ref 4.22–5.81)
RDW: 18 % — ABNORMAL HIGH (ref 11.5–15.5)
WBC: 21.3 10*3/uL — AB (ref 4.0–10.5)

## 2015-07-27 MED ORDER — INSULIN ASPART 100 UNIT/ML ~~LOC~~ SOLN
20.0000 [IU] | Freq: Once | SUBCUTANEOUS | Status: AC
  Administered 2015-07-27: 20 [IU] via SUBCUTANEOUS

## 2015-07-27 NOTE — Progress Notes (Signed)
CHAMP (antimicrobial stewardship ) NOTE  Patient has staph aureus bacteremia. Currently on appropriate coverage but will need to be narrowed, and ruled out for endocarditis. Await for susceptibilities. If questions, please contact ID doctor on call this weekend       [facility]icrobial Management Team Staphylococcus aureus bacteremia   Staphylococcus aureus bacteremia (SAB) is associated with a high rate of complications and mortality.  Specific aspects of clinical management are critical to optimizing the outcome of patients with SAB.  Therefore, the [facility] Antimicrobial Management Team [facility]) has initiated an intervention aimed at improving the management of SAB at [facility].  To do so, Infectious Diseases physicians are providing an evidence-based consult for the management of all patients with SAB.     Yes No Comments  Perform follow-up blood cultures (even if the patient is afebrile) to ensure clearance of bacteremia '[]'$  '[x]'$  Scheduled 1/14  Remove vascular catheter and obtain follow-up blood cultures after the removal of the catheter '[]'$  '[x]'$  Has retained lines that would need to be removed  Perform echocardiography to evaluate for endocarditis (transthoracic ECHO is 40-50% sensitive, TEE is > 90% sensitive) '[]'$  '[x]'$  Please keep in mind, that neither test can definitively EXCLUDE endocarditis, and that should clinical suspicion remain high for endocarditis the patient should then still be treated with an "endocarditis" duration of therapy = 6 weeks       Ensure source control '[]'$  '[]'$  Have all abscesses been drained effectively? Have deep seeded infections (septic joints or osteomyelitis) had appropriate surgical debridement?  Investigate for "metastatic" sites of infection '[]'$  '[]'$  Does the patient have ANY symptom or physical exam finding that would suggest a deeper infection (back or neck pain that may be suggestive of vertebral osteomyelitis or epidural abscess, muscle pain that could  be a symptom of pyomyositis)?  Keep in mind that for deep seeded infections MRI imaging with contrast is preferred rather than other often insensitive tests such as plain x-rays, especially early in a patient's presentation.  Change antibiotic therapy to ______vancomycin for now -- awaiting susceptibilities_______ '[]'$  '[]'$  Beta-lactam antibiotics are preferred for MSSA due to higher cure rates.   If on Vancomycin, goal trough should be 15 - 20 mcg/mL  Estimated duration of IV antibiotic therapy:  4-6 wk '[]'$  '[]'$  Consult case management for probably prolonged outpatient IV antibiotic therapy

## 2015-07-27 NOTE — Care Management Important Message (Signed)
Important Message  Patient Details  Name: Ryan [name] MRN: [id number] Date of Birth: October 22, 1960   Medicare Important Message Given:  Yes    Sherald Barge, RN 07/27/2015, 2:24 PM

## 2015-07-27 NOTE — Progress Notes (Signed)
Subjective: Patient is extubated  Successfully. He is on vent mask.  Patient is improving.  Objective: Vital signs in last 24 hours: Temp:  [97.5 F (36.4 C)-98.4 F (36.9 C)] 98.2 F (36.8 C) (01/13 0749) Pulse Rate:  [69-118] 90 (01/13 0600) Resp:  [11-26] 26 (01/13 0600) BP: (97-163)/(64-88) 104/68 mmHg (01/13 0600) SpO2:  [79 %-96 %] 95 % (01/13 0600) FiO2 (%):  [40 %-60 %] 60 % (01/13 0412) Weight:  [67 kg (147 lb 11.3 oz)] 67 kg (147 lb 11.3 oz) (01/13 0500) Weight change: -5.3 kg (-11 lb 11 oz) Last BM Date: 07/23/15  Intake/Output from previous day: 01/12 0701 - 01/13 0700 In: 1030 [P.O.:480; IV Piggyback:550] Out: 4750 [Urine:4750]  PHYSICAL EXAM General appearance: alert and no distress Resp: diminished breath sounds bilaterally and rhonchi bilaterally Cardio: S1, S2 normal GI: soft, non-tender; bowel sounds normal; no masses,  no organomegaly Extremities: extremities normal, atraumatic, no cyanosis or edema  Lab Results:  Results for orders placed or performed during the hospital encounter of 07/15/15 (from the past 48 hour(s))  CBC with Differential/Platelet     Status: Abnormal   Collection Time: 07/25/15  8:28 AM  Result Value Ref Range   WBC 38.6 (H) 4.0 - 10.5 K/uL   RBC 5.29 4.22 - 5.81 MIL/uL   Hemoglobin 12.6 (L) 13.0 - 17.0 g/dL   HCT 40.0 39.0 - 52.0 %   MCV 75.6 (L) 78.0 - 100.0 fL   MCH 23.8 (L) 26.0 - 34.0 pg   MCHC 31.5 30.0 - 36.0 g/dL   RDW 17.6 (H) 11.5 - 15.5 %   Platelets 362 150 - 400 K/uL    Comment: SPECIMEN CHECKED FOR CLOTS PLATELET COUNT CONFIRMED BY SMEAR    Neutrophils Relative % 78 %   Lymphocytes Relative 17 %   Monocytes Relative 3 %   Eosinophils Relative 0 %   Basophils Relative 0 %   Band Neutrophils 2 %   Metamyelocytes Relative 0 %   Myelocytes 0 %   Promyelocytes Absolute 0 %   Blasts 0 %   nRBC 0 0 /100 WBC   Other 0 %   Neutro Abs 30.8 (H) 1.7 - 7.7 K/uL   Lymphs Abs 6.6 (H) 0.7 - 4.0 K/uL   Monocytes  Absolute 1.2 (H) 0.1 - 1.0 K/uL   Eosinophils Absolute 0.0 0.0 - 0.7 K/uL   Basophils Absolute 0.0 0.0 - 0.1 K/uL   WBC Morphology ATYPICAL LYMPHOCYTES     Comment: HYPERSEGMENTED NEUT  Troponin I (q 6hr x 3)     Status: Abnormal   Collection Time: 07/25/15  8:28 AM  Result Value Ref Range   Troponin I 0.05 (H) <0.031 ng/mL    Comment:        PERSISTENTLY INCREASED TROPONIN VALUES IN THE RANGE OF 0.04-0.49 ng/mL CAN BE SEEN IN:       -UNSTABLE ANGINA       -CONGESTIVE HEART FAILURE       -MYOCARDITIS       -CHEST TRAUMA       -ARRYHTHMIAS       -LATE PRESENTING MYOCARDIAL INFARCTION       -COPD   CLINICAL FOLLOW-UP RECOMMENDED.   Brain natriuretic peptide     Status: Abnormal   Collection Time: 07/25/15  8:28 AM  Result Value Ref Range   B Natriuretic Peptide 113.0 (H) 0.0 - 100.0 pg/mL  Lactic acid, plasma     Status: Abnormal   Collection Time: 07/25/15  8:28 AM  Result Value Ref Range   Lactic Acid, Venous 4.2 (HH) 0.5 - 2.0 mmol/L    Comment: CRITICAL RESULT CALLED TO, READ BACK BY AND VERIFIED WITH: CHENOWITZ,L AT 9:10AM ON 07/25/15 BY [facility]   Hepatic function panel     Status: Abnormal   Collection Time: 07/25/15  8:28 AM  Result Value Ref Range   Total Protein 6.4 (L) 6.5 - 8.1 g/dL   Albumin 2.6 (L) 3.5 - 5.0 g/dL   AST 51 (H) 15 - 41 U/L   ALT 64 (H) 17 - 63 U/L   Alkaline Phosphatase 73 38 - 126 U/L   Total Bilirubin 0.9 0.3 - 1.2 mg/dL   Bilirubin, Direct 0.3 0.1 - 0.5 mg/dL   Indirect Bilirubin 0.6 0.3 - 0.9 mg/dL  Culture, blood (Routine X 2) w Reflex to ID Panel     Status: None (Preliminary result)   Collection Time: 07/25/15  8:30 AM  Result Value Ref Range   Specimen Description BLOOD LEFT ARM    Special Requests BOTTLES DRAWN AEROBIC AND ANAEROBIC 10 CC EACH    Culture  Setup Time      GRAM POSITIVE COCCI IN CLUSTERS Gram Stain Report Called to,Read Back By and Verified With: HEARNS J AT 0425 ON [id number] BY FORSYTH K    Culture NO GROWTH 2  DAYS    Report Status PENDING   Culture, blood (Routine X 2) w Reflex to ID Panel     Status: None (Preliminary result)   Collection Time: 07/25/15  8:35 AM  Result Value Ref Range   Specimen Description BLOOD LEFT HAND    Special Requests BOTTLES DRAWN AEROBIC AND ANAEROBIC 10 CC EACH    Culture  Setup Time      GRAM POSITIVE COCCI IN CLUSTERS Gram Stain Report Called to,Read Back By and Verified With: HEARN J AT 0425 ON [id number] BY FORSYTH K    Culture NO GROWTH 2 DAYS    Report Status PENDING   Glucose, capillary     Status: None   Collection Time: 07/25/15 11:27 AM  Result Value Ref Range   Glucose-Capillary 85 65 - 99 mg/dL   Comment 1 Notify RN    Comment 2 Document in Chart   Draw ABG 1 hour after initiation of ventilator     Status: Abnormal   Collection Time: 07/25/15 11:48 AM  Result Value Ref Range   FIO2 40.00    Delivery systems VENTILATOR    Mode PRESSURE REGULATED VOLUME CONTROL    VT 520 mL   LHR 14 resp/min   Peep/cpap 5.0 cm H20   pH, Arterial 7.424 7.350 - 7.450   pCO2 arterial 42.8 35.0 - 45.0 mmHg   pO2, Arterial 72.9 (L) 80.0 - 100.0 mmHg   Bicarbonate 27.2 (H) 20.0 - 24.0 mEq/L   Acid-Base Excess 3.4 (H) 0.0 - 2.0 mmol/L   O2 Saturation 92.4 %   Collection site LEFT RADIAL    Drawn by [id number]    Sample type ARTERIAL    Allens test (pass/fail) PASS PASS  Troponin I (q 6hr x 3)     Status: Abnormal   Collection Time: 07/25/15  2:13 PM  Result Value Ref Range   Troponin I 0.04 (H) <0.031 ng/mL    Comment:        PERSISTENTLY INCREASED TROPONIN VALUES IN THE RANGE OF 0.04-0.49 ng/mL CAN BE SEEN IN:       -UNSTABLE ANGINA       -  CONGESTIVE HEART FAILURE       -MYOCARDITIS       -CHEST TRAUMA       -ARRYHTHMIAS       -LATE PRESENTING MYOCARDIAL INFARCTION       -COPD   CLINICAL FOLLOW-UP RECOMMENDED.   Basic metabolic panel     Status: Abnormal   Collection Time: 07/25/15  2:13 PM  Result Value Ref Range   Sodium 139 135 - 145 mmol/L   Potassium  4.9 3.5 - 5.1 mmol/L   Chloride 101 101 - 111 mmol/L   CO2 27 22 - 32 mmol/L   Glucose, Bld 80 65 - 99 mg/dL   BUN 39 (H) 6 - 20 mg/dL   Creatinine, Ser 1.40 (H) 0.61 - 1.24 mg/dL   Calcium 7.4 (L) 8.9 - 10.3 mg/dL   GFR calc non Af Amer 55 (L) >60 mL/min   GFR calc Af Amer >60 >60 mL/min    Comment: (NOTE) The eGFR has been calculated using the CKD EPI equation. This calculation has not been validated in all clinical situations. eGFR's persistently <60 mL/min signify possible Chronic Kidney Disease.    Anion gap 11 5 - 15  Glucose, capillary     Status: Abnormal   Collection Time: 07/25/15  4:48 PM  Result Value Ref Range   Glucose-Capillary 116 (H) 65 - 99 mg/dL   Comment 1 Notify RN    Comment 2 Document in Chart   Troponin I (q 6hr x 3)     Status: None   Collection Time: 07/25/15  7:49 PM  Result Value Ref Range   Troponin I <0.03 <0.031 ng/mL    Comment:        NO INDICATION OF MYOCARDIAL INJURY.   Glucose, capillary     Status: Abnormal   Collection Time: 07/25/15  9:42 PM  Result Value Ref Range   Glucose-Capillary 169 (H) 65 - 99 mg/dL   Comment 1 Notify RN   CBC with Differential/Platelet     Status: Abnormal   Collection Time: 07/26/15  4:40 AM  Result Value Ref Range   WBC 22.0 (H) 4.0 - 10.5 K/uL   RBC 4.31 4.22 - 5.81 MIL/uL   Hemoglobin 10.1 (L) 13.0 - 17.0 g/dL   HCT 32.0 (L) 39.0 - 52.0 %   MCV 74.2 (L) 78.0 - 100.0 fL   MCH 23.4 (L) 26.0 - 34.0 pg   MCHC 31.6 30.0 - 36.0 g/dL   RDW 17.8 (H) 11.5 - 15.5 %   Platelets 296 150 - 400 K/uL   Neutrophils Relative % 89 %   Neutro Abs 19.5 (H) 1.7 - 7.7 K/uL   Lymphocytes Relative 5 %   Lymphs Abs 1.2 0.7 - 4.0 K/uL   Monocytes Relative 6 %   Monocytes Absolute 1.3 (H) 0.1 - 1.0 K/uL   Eosinophils Relative 0 %   Eosinophils Absolute 0.0 0.0 - 0.7 K/uL   Basophils Relative 0 %   Basophils Absolute 0.0 0.0 - 0.1 K/uL  Basic metabolic panel     Status: Abnormal   Collection Time: 07/26/15  4:40 AM   Result Value Ref Range   Sodium 140 135 - 145 mmol/L   Potassium 4.3 3.5 - 5.1 mmol/L   Chloride 96 (L) 101 - 111 mmol/L   CO2 33 (H) 22 - 32 mmol/L   Glucose, Bld 248 (H) 65 - 99 mg/dL   BUN 37 (H) 6 - 20 mg/dL   Creatinine, Ser 1.16 0.61 -  1.24 mg/dL   Calcium 7.9 (L) 8.9 - 10.3 mg/dL   GFR calc non Af Amer >60 >60 mL/min   GFR calc Af Amer >60 >60 mL/min    Comment: (NOTE) The eGFR has been calculated using the CKD EPI equation. This calculation has not been validated in all clinical situations. eGFR's persistently <60 mL/min signify possible Chronic Kidney Disease.    Anion gap 11 5 - 15  Triglycerides     Status: Abnormal   Collection Time: 07/26/15  4:40 AM  Result Value Ref Range   Triglycerides 182 (H) <150 mg/dL  Blood gas, arterial     Status: Abnormal   Collection Time: 07/26/15  5:30 AM  Result Value Ref Range   FIO2 0.50    Delivery systems VENTILATOR    Mode PRESSURE REGULATED VOLUME CONTROL    VT 520 mL   LHR 14 resp/min   Peep/cpap 5.0 cm H20   pH, Arterial 7.529 (H) 7.350 - 7.450   pCO2 arterial 38.4 35.0 - 45.0 mmHg   pO2, Arterial 65.1 (L) 80.0 - 100.0 mmHg   Bicarbonate 32.3 (H) 20.0 - 24.0 mEq/L   Acid-Base Excess 8.5 (H) 0.0 - 2.0 mmol/L   O2 Saturation 92.2 %   Patient temperature 37.0    Collection site LEFT RADIAL    Drawn by [phone]    Sample type ARTERIAL DRAW    Allens test (pass/fail) PASS PASS  Glucose, capillary     Status: Abnormal   Collection Time: 07/26/15  7:49 AM  Result Value Ref Range   Glucose-Capillary 246 (H) 65 - 99 mg/dL   Comment 1 Notify RN    Comment 2 Document in Chart   Glucose, capillary     Status: Abnormal   Collection Time: 07/26/15 11:39 AM  Result Value Ref Range   Glucose-Capillary 202 (H) 65 - 99 mg/dL   Comment 1 Notify RN    Comment 2 Document in Chart   Blood gas, arterial     Status: Abnormal   Collection Time: 07/26/15 12:05 PM  Result Value Ref Range   FIO2 0.40    Delivery systems VENTILATOR     Mode PRESSURE SUPPORT    Peep/cpap 5.0 cm H20   Pressure support 5 cm H20   pH, Arterial 7.544 (H) 7.350 - 7.450   pCO2 arterial 39.8 35.0 - 45.0 mmHg   pO2, Arterial 54.2 (L) 80.0 - 100.0 mmHg   Bicarbonate 34.3 (H) 20.0 - 24.0 mEq/L   Acid-Base Excess 10.8 (H) 0.0 - 2.0 mmol/L   O2 Saturation 86.2 %   Patient temperature 37.0    Collection site LEFT RADIAL    Drawn by [id number]    Sample type ARTERIAL DRAW    Allens test (pass/fail) PASS PASS  Glucose, capillary     Status: Abnormal   Collection Time: 07/26/15  4:26 PM  Result Value Ref Range   Glucose-Capillary 108 (H) 65 - 99 mg/dL   Comment 1 Notify RN    Comment 2 Document in Chart   Glucose, capillary     Status: Abnormal   Collection Time: 07/26/15  9:05 PM  Result Value Ref Range   Glucose-Capillary 447 (H) 65 - 99 mg/dL   Comment 1 Notify RN   Glucose, random     Status: Abnormal   Collection Time: 07/26/15  9:26 PM  Result Value Ref Range   Glucose, Bld 482 (H) 65 - 99 mg/dL  CBC with Differential/Platelet     Status:  Abnormal   Collection Time: 07/27/15  4:35 AM  Result Value Ref Range   WBC 21.3 (H) 4.0 - 10.5 K/uL   RBC 4.46 4.22 - 5.81 MIL/uL   Hemoglobin 10.4 (L) 13.0 - 17.0 g/dL   HCT 32.9 (L) 39.0 - 52.0 %   MCV 73.8 (L) 78.0 - 100.0 fL   MCH 23.3 (L) 26.0 - 34.0 pg   MCHC 31.6 30.0 - 36.0 g/dL   RDW 18.0 (H) 11.5 - 15.5 %   Platelets 297 150 - 400 K/uL   Neutrophils Relative % 85 %   Neutro Abs 18.1 (H) 1.7 - 7.7 K/uL   Lymphocytes Relative 6 %   Lymphs Abs 1.2 0.7 - 4.0 K/uL   Monocytes Relative 9 %   Monocytes Absolute 2.0 (H) 0.1 - 1.0 K/uL   Eosinophils Relative 0 %   Eosinophils Absolute 0.0 0.0 - 0.7 K/uL   Basophils Relative 0 %   Basophils Absolute 0.0 0.0 - 0.1 K/uL  Basic metabolic panel     Status: Abnormal   Collection Time: 07/27/15  4:35 AM  Result Value Ref Range   Sodium 142 135 - 145 mmol/L   Potassium 3.5 3.5 - 5.1 mmol/L    Comment: DELTA CHECK NOTED   Chloride 94 (L) 101 -  111 mmol/L   CO2 38 (H) 22 - 32 mmol/L   Glucose, Bld 60 (L) 65 - 99 mg/dL   BUN 38 (H) 6 - 20 mg/dL   Creatinine, Ser 1.17 0.61 - 1.24 mg/dL   Calcium 8.6 (L) 8.9 - 10.3 mg/dL   GFR calc non Af Amer >60 >60 mL/min   GFR calc Af Amer >60 >60 mL/min    Comment: (NOTE) The eGFR has been calculated using the CKD EPI equation. This calculation has not been validated in all clinical situations. eGFR's persistently <60 mL/min signify possible Chronic Kidney Disease.    Anion gap 10 5 - 15    ABGS  Recent Labs  07/25/15 0616  07/26/15 1205  PHART 7.409  < > 7.544*  PO2ART 90.5  < > 54.2*  TCO2 16.6  --   --   HCO3 26.6*  < > 34.3*  < > = values in this interval not displayed. CULTURES Recent Results (from the past 240 hour(s))  MRSA PCR Screening     Status: Abnormal   Collection Time: 07/25/15  7:40 AM  Result Value Ref Range Status   MRSA by PCR POSITIVE (A) NEGATIVE Final    Comment: RESULT CALLED TO, READ BACK BY AND VERIFIED WITH: SCHENOWITZ,L AT 1023 BY HUFFINES,S ON 07/25/15.        The GeneXpert MRSA Assay (FDA approved for NASAL specimens only), is one component of a comprehensive MRSA colonization surveillance program. It is not intended to diagnose MRSA infection nor to guide or monitor treatment for MRSA infections.   Culture, blood (Routine X 2) w Reflex to ID Panel     Status: None (Preliminary result)   Collection Time: 07/25/15  8:30 AM  Result Value Ref Range Status   Specimen Description BLOOD LEFT ARM  Final   Special Requests BOTTLES DRAWN AEROBIC AND ANAEROBIC 10 CC EACH  Final   Culture  Setup Time   Final    GRAM POSITIVE COCCI IN CLUSTERS Gram Stain Report Called to,Read Back By and Verified With: HEARNS J AT 0425 ON [id number] BY FORSYTH K    Culture NO GROWTH 2 DAYS  Final   Report Status PENDING  Incomplete  Culture, blood (Routine X 2) w Reflex to ID Panel     Status: None (Preliminary result)   Collection Time: 07/25/15  8:35 AM  Result  Value Ref Range Status   Specimen Description BLOOD LEFT HAND  Final   Special Requests BOTTLES DRAWN AEROBIC AND ANAEROBIC 10 CC EACH  Final   Culture  Setup Time   Final    GRAM POSITIVE COCCI IN CLUSTERS Gram Stain Report Called to,Read Back By and Verified With: HEARN J AT 0425 ON [id number] BY FORSYTH K    Culture NO GROWTH 2 DAYS  Final   Report Status PENDING  Incomplete   Studies/Results: Portable Chest Xray  07/26/2015  CLINICAL DATA:  Intubation. EXAM: PORTABLE CHEST 1 VIEW COMPARISON:  07/25/2015. FINDINGS: Endotracheal tube and NG tube in stable position. Mediastinum and hilar structures normal. Heart size normal. COPD . Improving infiltrate right lower lobe. Persistent small pleural effusion. No pneumothorax. IMPRESSION: 1. Lines and tubes in stable position . 2. Interim slight improvement of right lower lobe infiltrate. Persistent small right pleural effusion. Electronically Signed   By: Marcello Moores  Register   On: 07/26/2015 07:50    Medications: I have reviewed the patient's current medications.  Assesment:   Principal Problem:   Decompensated COPD with exacerbation (chronic obstructive pulmonary disease) (HCC) Active Problems:   Hyperlipidemia   Type 2 diabetes mellitus without complication (HCC)   Cavitary lesion of lung   Acute on chronic respiratory failure (HCC)   COPD exacerbation (HCC)   Sepsis (HCC)   Lobar pneumonia (HCC)   HAP (hospital-acquired pneumonia)    Plan:  Medications reviewed Continue IV antibiotics Continue oxygen therapy nebulizer Continue current treatment    LOS: 12 days   Yassin Scales 07/27/2015, 7:56 AM

## 2015-07-28 DIAGNOSIS — R7881 Bacteremia: Secondary | ICD-10-CM | POA: Diagnosis not present

## 2015-07-28 LAB — CBC WITH DIFFERENTIAL/PLATELET
BASOS PCT: 0 %
Basophils Absolute: 0 10*3/uL (ref 0.0–0.1)
Eosinophils Absolute: 0 10*3/uL (ref 0.0–0.7)
Eosinophils Relative: 0 %
HEMATOCRIT: 29.3 % — AB (ref 39.0–52.0)
HEMOGLOBIN: 9.6 g/dL — AB (ref 13.0–17.0)
LYMPHS ABS: 1.2 10*3/uL (ref 0.7–4.0)
Lymphocytes Relative: 7 %
MCH: 24.1 pg — ABNORMAL LOW (ref 26.0–34.0)
MCHC: 32.8 g/dL (ref 30.0–36.0)
MCV: 73.4 fL — ABNORMAL LOW (ref 78.0–100.0)
MONOS PCT: 6 %
Monocytes Absolute: 1 10*3/uL (ref 0.1–1.0)
NEUTROS ABS: 14 10*3/uL — AB (ref 1.7–7.7)
NEUTROS PCT: 87 %
Platelets: 240 10*3/uL (ref 150–400)
RBC: 3.99 MIL/uL — AB (ref 4.22–5.81)
RDW: 17.8 % — ABNORMAL HIGH (ref 11.5–15.5)
WBC: 16.2 10*3/uL — AB (ref 4.0–10.5)

## 2015-07-28 LAB — GLUCOSE, CAPILLARY
GLUCOSE-CAPILLARY: 174 mg/dL — AB (ref 65–99)
GLUCOSE-CAPILLARY: 187 mg/dL — AB (ref 65–99)
GLUCOSE-CAPILLARY: 394 mg/dL — AB (ref 65–99)
Glucose-Capillary: 259 mg/dL — ABNORMAL HIGH (ref 65–99)
Glucose-Capillary: 294 mg/dL — ABNORMAL HIGH (ref 65–99)
Glucose-Capillary: 384 mg/dL — ABNORMAL HIGH (ref 65–99)

## 2015-07-28 LAB — BASIC METABOLIC PANEL
Anion gap: 10 (ref 5–15)
BUN: 34 mg/dL — ABNORMAL HIGH (ref 6–20)
CHLORIDE: 90 mmol/L — AB (ref 101–111)
CO2: 38 mmol/L — AB (ref 22–32)
CREATININE: 1.04 mg/dL (ref 0.61–1.24)
Calcium: 8 mg/dL — ABNORMAL LOW (ref 8.9–10.3)
GFR calc non Af Amer: >60 mL/min (ref >60)
Glucose, Bld: 262 mg/dL — ABNORMAL HIGH (ref 65–99)
POTASSIUM: 3.5 mmol/L (ref 3.5–5.1)
Sodium: 138 mmol/L (ref 135–145)

## 2015-07-28 LAB — CULTURE, BLOOD (ROUTINE X 2)

## 2015-07-28 MED ORDER — INSULIN ASPART 100 UNIT/ML ~~LOC~~ SOLN
7.0000 [IU] | Freq: Once | SUBCUTANEOUS | Status: AC
Start: 2015-07-28 — End: 2015-07-28
  Administered 2015-07-28: 7 [IU] via SUBCUTANEOUS

## 2015-07-28 NOTE — Progress Notes (Signed)
ANTIBIOTIC CONSULT NOTE - follow up  Pharmacy Consult for Vancomycin Indication: pneumonia / MRSA BACTEREMIA  Allergies  Allergen Reactions  . Albuterol Palpitations  . Influenza Vaccine Live Swelling   Patient Measurements: Height: '5\' 8"'$  (172.7 cm) Weight: 144 lb 10 oz (65.6 kg) IBW/kg (Calculated) : 68.4  Vital Signs: Temp: 97.1 F (36.2 C) (01/14 0718) Temp Source: Oral (01/14 0718) BP: 142/76 mmHg (01/14 1000) Pulse Rate: 119 (01/14 1000) Intake/Output from previous day: 01/13 0701 - 01/14 0700 In: 1870 [P.O.:1320; IV Piggyback:550] Out: 1000 [Urine:1000] Intake/Output from this shift: Total I/O In: 530 [P.O.:480; IV Piggyback:50] Out: 750 [Urine:750]  Labs:  Recent Labs  07/26/15 0440 07/27/15 0435 07/28/15 0500  WBC 22.0* 21.3* 16.2*  HGB 10.1* 10.4* 9.6*  PLT 296 297 240  CREATININE 1.16 1.17 1.04   Estimated Creatinine Clearance: 73.6 mL/min (by C-G formula based on Cr of 1.04). No results for input(s): VANCOTROUGH, VANCOPEAK, VANCORANDOM, GENTTROUGH, GENTPEAK, GENTRANDOM, TOBRATROUGH, TOBRAPEAK, TOBRARND, AMIKACINPEAK, AMIKACINTROU, AMIKACIN in the last 72 hours.   Microbiology: Recent Results (from the past 720 hour(s))  MRSA PCR Screening     Status: Abnormal   Collection Time: 07/25/15  7:40 AM  Result Value Ref Range Status   MRSA by PCR POSITIVE (A) NEGATIVE Final    Comment: RESULT CALLED TO, READ BACK BY AND VERIFIED WITH: SCHENOWITZ,L AT 1023 BY HUFFINES,S ON 07/25/15.        The GeneXpert MRSA Assay (FDA approved for NASAL specimens only), is one component of a comprehensive MRSA colonization surveillance program. It is not intended to diagnose MRSA infection nor to guide or monitor treatment for MRSA infections.   Culture, blood (Routine X 2) w Reflex to ID Panel     Status: None   Collection Time: 07/25/15  8:30 AM  Result Value Ref Range Status   Specimen Description BLOOD LEFT ARM  Final   Special Requests BOTTLES DRAWN AEROBIC  AND ANAEROBIC 10 CC EACH  Final   Culture  Setup Time   Final    GRAM POSITIVE COCCI IN CLUSTERS Gram Stain Report Called to,Read Back By and Verified With: HEARNS J AT 0425 ON [id number] BY FORSYTH K AEROBIC BOTTLE ONLY    Culture   Final    STAPHYLOCOCCUS AUREUS SUSCEPTIBILITIES PERFORMED ON PREVIOUS CULTURE WITHIN THE LAST 5 DAYS. Performed at [facility]    Report Status 07/28/2015 FINAL  Final  Culture, blood (Routine X 2) w Reflex to ID Panel     Status: None   Collection Time: 07/25/15  8:35 AM  Result Value Ref Range Status   Specimen Description BLOOD LEFT HAND  Final   Special Requests BOTTLES DRAWN AEROBIC AND ANAEROBIC 10 CC EACH  Final   Culture  Setup Time   Final    GRAM POSITIVE COCCI IN CLUSTERS Gram Stain Report Called to,Read Back By and Verified With: HEARN J AT 0425 ON [id number] BY FORSYTH K IN BOTH AEROBIC AND ANAEROBIC BOTTLES    Culture   Final    METHICILLIN RESISTANT STAPHYLOCOCCUS AUREUS Performed at [facility]    Report Status 07/28/2015 FINAL  Final   Organism ID, Bacteria METHICILLIN RESISTANT STAPHYLOCOCCUS AUREUS  Final      Susceptibility   Methicillin resistant staphylococcus aureus - MIC*    CIPROFLOXACIN >=8 RESISTANT Resistant     ERYTHROMYCIN >=8 RESISTANT Resistant     GENTAMICIN <=0.5 SENSITIVE Sensitive     OXACILLIN >=4 RESISTANT Resistant     TETRACYCLINE <=1 SENSITIVE  Sensitive     VANCOMYCIN 1 SENSITIVE Sensitive     TRIMETH/SULFA <=10 SENSITIVE Sensitive     CLINDAMYCIN <=0.25 SENSITIVE Sensitive     RIFAMPIN <=0.5 SENSITIVE Sensitive     Inducible Clindamycin NEGATIVE Sensitive     * METHICILLIN RESISTANT STAPHYLOCOCCUS AUREUS  Culture, blood (Routine X 2) w Reflex to ID Panel     Status: None (Preliminary result)   Collection Time: 07/28/15  4:57 AM  Result Value Ref Range Status   Specimen Description BLOOD RIGHT ARM  Final   Special Requests BOTTLES DRAWN AEROBIC AND ANAEROBIC 6CC  Final   Culture PENDING   Incomplete   Report Status PENDING  Incomplete  Culture, blood (Routine X 2) w Reflex to ID Panel     Status: None (Preliminary result)   Collection Time: 07/28/15  5:05 AM  Result Value Ref Range Status   Specimen Description BLOOD RIGHT HAND  Final   Special Requests BOTTLES DRAWN AEROBIC AND ANAEROBIC 4CC  Final   Culture PENDING  Incomplete   Report Status PENDING  Incomplete   Medical History: Past Medical History  Diagnosis Date  . COPD (chronic obstructive pulmonary disease) ([place])   . Hypercholesteremia   . History of pneumonia   . Polycythemia   . Coronary atherosclerosis of native coronary artery     Mild nonobstructive disease at catheterization 2007  . 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 Borderline diabetes   . Seizures ([place])     Last seizure 2 yrs ago  . GERD (gastroesophageal reflux disease)   . Type 2 diabetes mellitus ([place])   . DDD (degenerative disc disease)     Cervical and thoracic  . Chronic back pain   . Bronchitis   . Chronic left shoulder pain   . Chronic neck pain   . Atrial fibrillation ([place])     Not anticoagulated  . Smoker   . Type 2 diabetes mellitus without complication ([place])   . CAP (community acquired pneumonia) 07/10/2013    04/2015  . Collagen vascular disease ([place])   . Chronic respiratory failure (HCC)    Anti-infectives    Start     Dose/Rate Route Frequency Ordered Stop   07/25/15 2200  vancomycin (VANCOCIN) IVPB 1000 mg/200 mL premix     1,000 mg 200 mL/hr over 60 Minutes Intravenous Every 12 hours 07/25/15 0742     07/25/15 1000  vancomycin (VANCOCIN) 1,500 mg in sodium chloride 0.9 % 500 mL IVPB     1,500 mg 250 mL/hr over 120 Minutes Intravenous  Once 07/25/15 0739 07/25/15 1300   07/25/15 0800  piperacillin-tazobactam (ZOSYN) IVPB 3.375 g  Status:  Discontinued     3.375 g 12.5 mL/hr over 240 Minutes Intravenous Every 8 hours 07/25/15 0738 07/28/15 1017   07/17/15 1000  azithromycin  (ZITHROMAX) tablet 250 mg     250 mg Oral Daily 07/15/15 2212 07/20/15 0833   07/16/15 1000  azithromycin (ZITHROMAX) tablet 500 mg     500 mg Oral Daily 07/15/15 2212 07/16/15 1050     Assessment: 55yo male with worsening respiratory distress.  Transferred to ICU, asked to initiate Vancomycin and Zosyn for pna.  Subsequently developed healthcare associated MRSA bacteremia while hospitalized for an acute exacerbation of his COPD. He also appears to have a new right lower lobe infiltrate with associated pleural effusion.  Appreciate ID input, ABX deescalated to Vancomycin monotherapy.    Estimated Creatinine Clearance: 73.6  mL/min (by C-G formula based on Cr of 1.04).  Goal of Therapy:  Vancomycin trough level 15-20 mcg/ml  Plan:  Vancomycin '1000mg'$  IV q12hrs Check trough level tomorrow before am dose Monitor labs, renal fxn, progress and c/s APPRECIATE ID ASSISTANCE / INPUT  [place] Crane, Hollee Fate A 07/28/2015,11:13 AM

## 2015-07-28 NOTE — Progress Notes (Signed)
Subjective: Patient feels much better. His breathing has improved. He is on 2 liters N/C oxygen. He is tolerated clear liquid diet  Objective: Vital signs in last 24 hours: Temp:  [97.1 F (36.2 C)-98.5 F (36.9 C)] 97.1 F (36.2 C) (01/14 0718) Pulse Rate:  [64-105] 74 (01/14 0700) Resp:  [15-23] 18 (01/14 0700) BP: (100-133)/(62-95) 112/68 mmHg (01/14 0700) SpO2:  [82 %-94 %] 90 % (01/14 0859) FiO2 (%):  [55 %] 55 % (01/13 1124) Weight:  [65.6 kg (144 lb 10 oz)] 65.6 kg (144 lb 10 oz) (01/14 0400) Weight change: -1.4 kg (-3 lb 1.4 oz) Last BM Date: 07/26/15  Intake/Output from previous day: 01/13 0701 - 01/14 0700 In: 1870 [P.O.:1320; IV Piggyback:550] Out: 1000 [Urine:1000]  PHYSICAL EXAM General appearance: alert and no distress Resp: diminished breath sounds bilaterally and rhonchi bilaterally Cardio: S1, S2 normal GI: soft, non-tender; bowel sounds normal; no masses,  no organomegaly Extremities: extremities normal, atraumatic, no cyanosis or edema  Lab Results:  Results for orders placed or performed during the hospital encounter of 07/15/15 (from the past 48 hour(s))  Glucose, capillary     Status: Abnormal   Collection Time: 07/26/15 11:39 AM  Result Value Ref Range   Glucose-Capillary 202 (H) 65 - 99 mg/dL   Comment 1 Notify RN    Comment 2 Document in Chart   Blood gas, arterial     Status: Abnormal   Collection Time: 07/26/15 12:05 PM  Result Value Ref Range   FIO2 0.40    Delivery systems VENTILATOR    Mode PRESSURE SUPPORT    Peep/cpap 5.0 cm H20   Pressure support 5 cm H20   pH, Arterial 7.544 (H) 7.350 - 7.450   pCO2 arterial 39.8 35.0 - 45.0 mmHg   pO2, Arterial 54.2 (L) 80.0 - 100.0 mmHg   Bicarbonate 34.3 (H) 20.0 - 24.0 mEq/L   Acid-Base Excess 10.8 (H) 0.0 - 2.0 mmol/L   O2 Saturation 86.2 %   Patient temperature 37.0    Collection site LEFT RADIAL    Drawn by [id number]    Sample type ARTERIAL DRAW    Allens test (pass/fail) PASS PASS   Glucose, capillary     Status: Abnormal   Collection Time: 07/26/15  4:26 PM  Result Value Ref Range   Glucose-Capillary 108 (H) 65 - 99 mg/dL   Comment 1 Notify RN    Comment 2 Document in Chart   Glucose, capillary     Status: Abnormal   Collection Time: 07/26/15  9:05 PM  Result Value Ref Range   Glucose-Capillary 447 (H) 65 - 99 mg/dL   Comment 1 Notify RN   Glucose, random     Status: Abnormal   Collection Time: 07/26/15  9:26 PM  Result Value Ref Range   Glucose, Bld 482 (H) 65 - 99 mg/dL  CBC with Differential/Platelet     Status: Abnormal   Collection Time: 07/27/15  4:35 AM  Result Value Ref Range   WBC 21.3 (H) 4.0 - 10.5 K/uL   RBC 4.46 4.22 - 5.81 MIL/uL   Hemoglobin 10.4 (L) 13.0 - 17.0 g/dL   HCT 32.9 (L) 39.0 - 52.0 %   MCV 73.8 (L) 78.0 - 100.0 fL   MCH 23.3 (L) 26.0 - 34.0 pg   MCHC 31.6 30.0 - 36.0 g/dL   RDW 18.0 (H) 11.5 - 15.5 %   Platelets 297 150 - 400 K/uL   Neutrophils Relative % 85 %   Neutro  Abs 18.1 (H) 1.7 - 7.7 K/uL   Lymphocytes Relative 6 %   Lymphs Abs 1.2 0.7 - 4.0 K/uL   Monocytes Relative 9 %   Monocytes Absolute 2.0 (H) 0.1 - 1.0 K/uL   Eosinophils Relative 0 %   Eosinophils Absolute 0.0 0.0 - 0.7 K/uL   Basophils Relative 0 %   Basophils Absolute 0.0 0.0 - 0.1 K/uL  Basic metabolic panel     Status: Abnormal   Collection Time: 07/27/15  4:35 AM  Result Value Ref Range   Sodium 142 135 - 145 mmol/L   Potassium 3.5 3.5 - 5.1 mmol/L    Comment: DELTA CHECK NOTED   Chloride 94 (L) 101 - 111 mmol/L   CO2 38 (H) 22 - 32 mmol/L   Glucose, Bld 60 (L) 65 - 99 mg/dL   BUN 38 (H) 6 - 20 mg/dL   Creatinine, Ser 1.17 0.61 - 1.24 mg/dL   Calcium 8.6 (L) 8.9 - 10.3 mg/dL   GFR calc non Af Amer >60 >60 mL/min   GFR calc Af Amer >60 >60 mL/min    Comment: (NOTE) The eGFR has been calculated using the CKD EPI equation. This calculation has not been validated in all clinical situations. eGFR's persistently <60 mL/min signify possible Chronic  Kidney Disease.    Anion gap 10 5 - 15  Glucose, capillary     Status: Abnormal   Collection Time: 07/27/15  7:48 AM  Result Value Ref Range   Glucose-Capillary 166 (H) 65 - 99 mg/dL  Glucose, capillary     Status: Abnormal   Collection Time: 07/27/15 11:24 AM  Result Value Ref Range   Glucose-Capillary 406 (H) 65 - 99 mg/dL  Glucose, capillary     Status: Abnormal   Collection Time: 07/27/15  4:23 PM  Result Value Ref Range   Glucose-Capillary 232 (H) 65 - 99 mg/dL  Glucose, capillary     Status: Abnormal   Collection Time: 07/27/15  8:23 PM  Result Value Ref Range   Glucose-Capillary 336 (H) 65 - 99 mg/dL   Comment 1 Notify RN    Comment 2 Document in Chart   Glucose, capillary     Status: Abnormal   Collection Time: 07/28/15 12:11 AM  Result Value Ref Range   Glucose-Capillary 174 (H) 65 - 99 mg/dL   Comment 1 Notify RN    Comment 2 Document in Chart   Culture, blood (Routine X 2) w Reflex to ID Panel     Status: None (Preliminary result)   Collection Time: 07/28/15  4:57 AM  Result Value Ref Range   Specimen Description BLOOD RIGHT ARM    Special Requests BOTTLES DRAWN AEROBIC AND ANAEROBIC 6CC    Culture PENDING    Report Status PENDING   CBC with Differential/Platelet     Status: Abnormal   Collection Time: 07/28/15  5:00 AM  Result Value Ref Range   WBC 16.2 (H) 4.0 - 10.5 K/uL    Comment: ATYPICAL LYMPHOCYTES   RBC 3.99 (L) 4.22 - 5.81 MIL/uL   Hemoglobin 9.6 (L) 13.0 - 17.0 g/dL   HCT 29.3 (L) 39.0 - 52.0 %   MCV 73.4 (L) 78.0 - 100.0 fL   MCH 24.1 (L) 26.0 - 34.0 pg   MCHC 32.8 30.0 - 36.0 g/dL   RDW 17.8 (H) 11.5 - 15.5 %   Platelets 240 150 - 400 K/uL    Comment: SPECIMEN CHECKED FOR CLOTS PLATELET COUNT CONFIRMED BY SMEAR  Neutrophils Relative % 87 %   Neutro Abs 14.0 (H) 1.7 - 7.7 K/uL   Lymphocytes Relative 7 %   Lymphs Abs 1.2 0.7 - 4.0 K/uL   Monocytes Relative 6 %   Monocytes Absolute 1.0 0.1 - 1.0 K/uL   Eosinophils Relative 0 %    Eosinophils Absolute 0.0 0.0 - 0.7 K/uL   Basophils Relative 0 %   Basophils Absolute 0.0 0.0 - 0.1 K/uL  Basic metabolic panel     Status: Abnormal   Collection Time: 07/28/15  5:00 AM  Result Value Ref Range   Sodium 138 135 - 145 mmol/L   Potassium 3.5 3.5 - 5.1 mmol/L   Chloride 90 (L) 101 - 111 mmol/L   CO2 38 (H) 22 - 32 mmol/L   Glucose, Bld 262 (H) 65 - 99 mg/dL   BUN 34 (H) 6 - 20 mg/dL   Creatinine, Ser 1.04 0.61 - 1.24 mg/dL   Calcium 8.0 (L) 8.9 - 10.3 mg/dL   GFR calc non Af Amer >60 >60 mL/min   GFR calc Af Amer >60 >60 mL/min    Comment: (NOTE) The eGFR has been calculated using the CKD EPI equation. This calculation has not been validated in all clinical situations. eGFR's persistently <60 mL/min signify possible Chronic Kidney Disease.    Anion gap 10 5 - 15  Culture, blood (Routine X 2) w Reflex to ID Panel     Status: None (Preliminary result)   Collection Time: 07/28/15  5:05 AM  Result Value Ref Range   Specimen Description BLOOD RIGHT HAND    Special Requests BOTTLES DRAWN AEROBIC AND ANAEROBIC 4CC    Culture PENDING    Report Status PENDING   Glucose, capillary     Status: Abnormal   Collection Time: 07/28/15  5:16 AM  Result Value Ref Range   Glucose-Capillary 259 (H) 65 - 99 mg/dL   Comment 1 Notify RN    Comment 2 Document in Chart   Glucose, capillary     Status: Abnormal   Collection Time: 07/28/15  7:55 AM  Result Value Ref Range   Glucose-Capillary 294 (H) 65 - 99 mg/dL   Comment 1 Notify RN    Comment 2 Document in Chart     ABGS  Recent Labs  07/26/15 1205  PHART 7.544*  PO2ART 54.2*  HCO3 34.3*   CULTURES Recent Results (from the past 240 hour(s))  MRSA PCR Screening     Status: Abnormal   Collection Time: 07/25/15  7:40 AM  Result Value Ref Range Status   MRSA by PCR POSITIVE (A) NEGATIVE Final    Comment: RESULT CALLED TO, READ BACK BY AND VERIFIED WITH: SCHENOWITZ,L AT 1023 BY HUFFINES,S ON 07/25/15.        The  GeneXpert MRSA Assay (FDA approved for NASAL specimens only), is one component of a comprehensive MRSA colonization surveillance program. It is not intended to diagnose MRSA infection nor to guide or monitor treatment for MRSA infections.   Culture, blood (Routine X 2) w Reflex to ID Panel     Status: None   Collection Time: 07/25/15  8:30 AM  Result Value Ref Range Status   Specimen Description BLOOD LEFT ARM  Final   Special Requests BOTTLES DRAWN AEROBIC AND ANAEROBIC 10 CC EACH  Final   Culture  Setup Time   Final    GRAM POSITIVE COCCI IN CLUSTERS Gram Stain Report Called to,Read Back By and Verified With: HEARNS J AT 0425 ON  [id number] BY FORSYTH K AEROBIC BOTTLE ONLY    Culture   Final    STAPHYLOCOCCUS AUREUS SUSCEPTIBILITIES PERFORMED ON PREVIOUS CULTURE WITHIN THE LAST 5 DAYS. Performed at [facility]    Report Status 07/28/2015 FINAL  Final  Culture, blood (Routine X 2) w Reflex to ID Panel     Status: None   Collection Time: 07/25/15  8:35 AM  Result Value Ref Range Status   Specimen Description BLOOD LEFT HAND  Final   Special Requests BOTTLES DRAWN AEROBIC AND ANAEROBIC 10 CC EACH  Final   Culture  Setup Time   Final    GRAM POSITIVE COCCI IN CLUSTERS Gram Stain Report Called to,Read Back By and Verified With: HEARN J AT 0425 ON [id number] BY FORSYTH K IN BOTH AEROBIC AND ANAEROBIC BOTTLES    Culture   Final    METHICILLIN RESISTANT STAPHYLOCOCCUS AUREUS Performed at [facility]    Report Status 07/28/2015 FINAL  Final   Organism ID, Bacteria METHICILLIN RESISTANT STAPHYLOCOCCUS AUREUS  Final      Susceptibility   Methicillin resistant staphylococcus aureus - MIC*    CIPROFLOXACIN >=8 RESISTANT Resistant     ERYTHROMYCIN >=8 RESISTANT Resistant     GENTAMICIN <=0.5 SENSITIVE Sensitive     OXACILLIN >=4 RESISTANT Resistant     TETRACYCLINE <=1 SENSITIVE Sensitive     VANCOMYCIN 1 SENSITIVE Sensitive     TRIMETH/SULFA <=10 SENSITIVE Sensitive      CLINDAMYCIN <=0.25 SENSITIVE Sensitive     RIFAMPIN <=0.5 SENSITIVE Sensitive     Inducible Clindamycin NEGATIVE Sensitive     * METHICILLIN RESISTANT STAPHYLOCOCCUS AUREUS  Culture, blood (Routine X 2) w Reflex to ID Panel     Status: None (Preliminary result)   Collection Time: 07/28/15  4:57 AM  Result Value Ref Range Status   Specimen Description BLOOD RIGHT ARM  Final   Special Requests BOTTLES DRAWN AEROBIC AND ANAEROBIC 6CC  Final   Culture PENDING  Incomplete   Report Status PENDING  Incomplete  Culture, blood (Routine X 2) w Reflex to ID Panel     Status: None (Preliminary result)   Collection Time: 07/28/15  5:05 AM  Result Value Ref Range Status   Specimen Description BLOOD RIGHT HAND  Final   Special Requests BOTTLES DRAWN AEROBIC AND ANAEROBIC 4CC  Final   Culture PENDING  Incomplete   Report Status PENDING  Incomplete   Studies/Results: No results found.  Medications: I have reviewed the patient's current medications.  Assesment:   Principal Problem:   Decompensated COPD with exacerbation (chronic obstructive pulmonary disease) (HCC) Active Problems:   Hyperlipidemia   Type 2 diabetes mellitus without complication ([place])   Cavitary lesion of lung   Acute on chronic respiratory failure (HCC)   COPD exacerbation (HCC)   Sepsis (HCC)   Lobar pneumonia (HCC)   HAP (hospital-acquired pneumonia)    Plan:  Medications reviewed Continue IV antibiotics Continue oxygen therapy nebulizer Continue current treatment Will advance diet    LOS: 13 days   Ryan [name],Ryan [name] 07/28/2015, 9:07 AM

## 2015-07-28 NOTE — Progress Notes (Signed)
eLink Physician-Brief Progress Note Patient Name: Ryan [name] DOB: 1961/01/30 MRN: [id number]   Date of Service  07/28/2015  HPI/Events of Note  Hyperglycemia on AC/HS coverage but CBGs checked q4 hours.  BS now 289  eICU Interventions  Plan: 7 units of novolog subq now Change blood sugar checks to AC/HS     Intervention Category Intermediate Interventions: Hyperglycemia - evaluation and treatment  DETERDING,ELIZABETH 07/28/2015, 5:52 AM

## 2015-07-28 NOTE — Progress Notes (Signed)
Patient ID: Ryan [name], male   DOB: [birth year], 55 y.o.   MRN: [id number]         [facility]    Date of Admission:  07/15/2015           Day 4 vancomycin        Day 4 piperacillin tazobactam  Principal Problem:   MRSA bacteremia Active Problems:   Hyperlipidemia   Type 2 diabetes mellitus without complication (HCC)   Cavitary lesion of lung   Decompensated COPD with exacerbation (chronic obstructive pulmonary disease) (HCC)   Acute on chronic respiratory failure (HCC)   COPD exacerbation (HCC)   Sepsis (HCC)   Lobar pneumonia (HCC)   HAP (hospital-acquired pneumonia)   . Chlorhexidine Gluconate Cloth  6 each Topical Q0600  . edoxaban  60 mg Oral Daily  . furosemide  80 mg Intravenous Q12H  . insulin aspart  0-20 Units Subcutaneous TID WC  . insulin aspart  0-5 Units Subcutaneous QHS  . ipratropium  0.5 mg Nebulization Q4H  . levalbuterol  0.63 mg Nebulization Q4H  . methylPREDNISolone (SOLU-MEDROL) injection  125 mg Intravenous Q6H  . mupirocin ointment  1 application Nasal BID  . nicotine  14 mg Transdermal Daily  . pantoprazole (PROTONIX) IV  40 mg Intravenous Daily  . piperacillin-tazobactam (ZOSYN)  IV  3.375 g Intravenous Q8H  . sodium chloride  3 mL Intravenous Q12H  . sodium chloride  3 mL Intravenous Q12H  . vancomycin  1,000 mg Intravenous Q12H      Review of Systems: ROS no review of systems possible as this is a remote consultation.  Past Medical History  Diagnosis Date  . COPD (chronic obstructive pulmonary disease) ([place])   . Hypercholesteremia   . History of pneumonia   . Polycythemia   . Coronary atherosclerosis of native coronary artery     Mild nonobstructive disease at catheterization 2007  . 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 Borderline diabetes   . Seizures ([place])     Last seizure 2 yrs ago  . GERD (gastroesophageal reflux disease)   . Type 2 diabetes mellitus ([place])   .  DDD (degenerative disc disease)     Cervical and thoracic  . Chronic back pain   . Bronchitis   . Chronic left shoulder pain   . Chronic neck pain   . Atrial fibrillation ([place])     Not anticoagulated  . Smoker   . Type 2 diabetes mellitus without complication ([place])   . CAP (community acquired pneumonia) 07/10/2013    04/2015  . Collagen vascular disease ([place])   . Chronic respiratory failure [facility])     Social History  Substance Use Topics  . Smoking status: Former Smoker -- 1.00 packs/day for 45 years    Types: Cigarettes    Quit date: 05/01/2015  . Smokeless tobacco: Never Used  . Alcohol Use: No    Family History  Problem Relation Age of Onset  . Hypertension Mother   . Diabetes Mother   . Heart attack Mother   . Heart failure Mother   . Hypertension Sister   . Diabetes Sister   . Heart failure Sister    Allergies  Allergen Reactions  . Albuterol Palpitations  . Influenza Vaccine Live Swelling    OBJECTIVE: Filed Vitals:   07/28/15 0600 07/28/15 0700 07/28/15 0718 07/28/15 0859  BP: 109/71 112/68    Pulse: 71 74  Temp:   97.1 F (36.2 C)   TempSrc:   Oral   Resp: 19 18    Height:      Weight:      SpO2: 88% 88%  90%   Body mass index is 21.99 kg/(m^2).  Physical Exam no physical exam as this is a remote consultation.  Lab Results Lab Results  Component Value Date   WBC 16.2* 07/28/2015   HGB 9.6* 07/28/2015   HCT 29.3* 07/28/2015   MCV 73.4* 07/28/2015   PLT 240 07/28/2015    Lab Results  Component Value Date   CREATININE 1.04 07/28/2015   BUN 34* 07/28/2015   NA 138 07/28/2015   K 3.5 07/28/2015   CL 90* 07/28/2015   CO2 38* 07/28/2015    Lab Results  Component Value Date   ALT 64* 07/25/2015   AST 51* 07/25/2015   ALKPHOS 73 07/25/2015   BILITOT 0.9 07/25/2015     Microbiology: Recent Results (from the past 240 hour(s))  MRSA PCR Screening     Status: Abnormal   Collection Time: 07/25/15  7:40 AM  Result Value Ref Range  Status   MRSA by PCR POSITIVE (A) NEGATIVE Final    Comment: RESULT CALLED TO, READ BACK BY AND VERIFIED WITH: SCHENOWITZ,L AT 1023 BY HUFFINES,S ON 07/25/15.        The GeneXpert MRSA Assay (FDA approved for NASAL specimens only), is one component of a comprehensive MRSA colonization surveillance program. It is not intended to diagnose MRSA infection nor to guide or monitor treatment for MRSA infections.   Culture, blood (Routine X 2) w Reflex to ID Panel     Status: None   Collection Time: 07/25/15  8:30 AM  Result Value Ref Range Status   Specimen Description BLOOD LEFT ARM  Final   Special Requests BOTTLES DRAWN AEROBIC AND ANAEROBIC 10 CC EACH  Final   Culture  Setup Time   Final    GRAM POSITIVE COCCI IN CLUSTERS Gram Stain Report Called to,Read Back By and Verified With: HEARNS J AT 0425 ON [id number] BY FORSYTH K AEROBIC BOTTLE ONLY    Culture   Final    STAPHYLOCOCCUS AUREUS SUSCEPTIBILITIES PERFORMED ON PREVIOUS CULTURE WITHIN THE LAST 5 DAYS. Performed at [facility]    Report Status 07/28/2015 FINAL  Final  Culture, blood (Routine X 2) w Reflex to ID Panel     Status: None   Collection Time: 07/25/15  8:35 AM  Result Value Ref Range Status   Specimen Description BLOOD LEFT HAND  Final   Special Requests BOTTLES DRAWN AEROBIC AND ANAEROBIC 10 CC EACH  Final   Culture  Setup Time   Final    GRAM POSITIVE COCCI IN CLUSTERS Gram Stain Report Called to,Read Back By and Verified With: HEARN J AT 0425 ON [id number] BY FORSYTH K IN BOTH AEROBIC AND ANAEROBIC BOTTLES    Culture   Final    METHICILLIN RESISTANT STAPHYLOCOCCUS AUREUS Performed at [facility]    Report Status 07/28/2015 FINAL  Final   Organism ID, Bacteria METHICILLIN RESISTANT STAPHYLOCOCCUS AUREUS  Final      Susceptibility   Methicillin resistant staphylococcus aureus - MIC*    CIPROFLOXACIN >=8 RESISTANT Resistant     ERYTHROMYCIN >=8 RESISTANT Resistant     GENTAMICIN <=0.5 SENSITIVE  Sensitive     OXACILLIN >=4 RESISTANT Resistant     TETRACYCLINE <=1 SENSITIVE Sensitive     VANCOMYCIN 1 SENSITIVE Sensitive  TRIMETH/SULFA <=10 SENSITIVE Sensitive     CLINDAMYCIN <=0.25 SENSITIVE Sensitive     RIFAMPIN <=0.5 SENSITIVE Sensitive     Inducible Clindamycin NEGATIVE Sensitive     * METHICILLIN RESISTANT STAPHYLOCOCCUS AUREUS  Culture, blood (Routine X 2) w Reflex to ID Panel     Status: None (Preliminary result)   Collection Time: 07/28/15  4:57 AM  Result Value Ref Range Status   Specimen Description BLOOD RIGHT ARM  Final   Special Requests BOTTLES DRAWN AEROBIC AND ANAEROBIC 6CC  Final   Culture PENDING  Incomplete   Report Status PENDING  Incomplete  Culture, blood (Routine X 2) w Reflex to ID Panel     Status: None (Preliminary result)   Collection Time: 07/28/15  5:05 AM  Result Value Ref Range Status   Specimen Description BLOOD RIGHT HAND  Final   Special Requests BOTTLES DRAWN AEROBIC AND ANAEROBIC 4CC  Final   Culture PENDING  Incomplete   Report Status PENDING  Incomplete     ASSESSMENT: Mr. Tay developed healthcare associated MRSA bacteremia while hospitalized for an acute exacerbation of his COPD. He also appears to have a new right lower lobe infiltrate with associated pleural effusion. I will continue vancomycin and discontinue piperacillin tazobactam now. Repeat blood cultures are pending. There was no evidence of valvular lesions on a transthoracic echocardiogram done on 07/16/2015 before he became bacteremic. I would consider transesophageal echocardiogram next week but since this was hospital acquired and caught and treated very early endocarditis is probably relatively unlikely. He seems to be doing better. If he worsens I would consider diagnostic thoracentesis to rule out empyema.  PLAN: 1. Continue vancomycin 2. Discontinue piperacillin tazobactam 3. Await repeat blood culture results 4. We will check back in on Monday, 07/30/2015  Michel Bickers, MD [facility] [phone] pager   [phone] cell 07/28/2015, 10:13 AM

## 2015-07-29 LAB — GLUCOSE, CAPILLARY
GLUCOSE-CAPILLARY: 383 mg/dL — AB (ref 65–99)
GLUCOSE-CAPILLARY: 456 mg/dL — AB (ref 65–99)
GLUCOSE-CAPILLARY: 320 mg/dL — AB (ref 65–99)
Glucose-Capillary: 231 mg/dL — ABNORMAL HIGH (ref 65–99)

## 2015-07-29 LAB — VANCOMYCIN, TROUGH: VANCOMYCIN TR: 14 ug/mL (ref 10.0–20.0)

## 2015-07-29 MED ORDER — IPRATROPIUM BROMIDE 0.02 % IN SOLN
0.5000 mg | Freq: Four times a day (QID) | RESPIRATORY_TRACT | Status: DC
  Administered 2015-07-29 – 2015-08-01 (×14): 0.5 mg via RESPIRATORY_TRACT
  Filled 2015-07-29 (×14): qty 2.5

## 2015-07-29 MED ORDER — VANCOMYCIN HCL IN DEXTROSE 1-5 GM/200ML-% IV SOLN
1000.0000 mg | Freq: Three times a day (TID) | INTRAVENOUS | Status: DC
  Administered 2015-07-29 – 2015-07-31 (×5): 1000 mg via INTRAVENOUS
  Filled 2015-07-29 (×5): qty 200

## 2015-07-29 MED ORDER — LEVALBUTEROL HCL 0.63 MG/3ML IN NEBU
0.6300 mg | INHALATION_SOLUTION | Freq: Four times a day (QID) | RESPIRATORY_TRACT | Status: DC
  Administered 2015-07-29 – 2015-08-01 (×14): 0.63 mg via RESPIRATORY_TRACT
  Filled 2015-07-29 (×14): qty 3

## 2015-07-29 MED ORDER — INSULIN GLARGINE 100 UNIT/ML ~~LOC~~ SOLN
30.0000 [IU] | Freq: Every day | SUBCUTANEOUS | Status: DC
  Administered 2015-07-29 – 2015-07-31 (×3): 30 [IU] via SUBCUTANEOUS
  Filled 2015-07-29 (×4): qty 0.3

## 2015-07-29 NOTE — Progress Notes (Signed)
ANTIBIOTIC CONSULT NOTE - follow up  Pharmacy Consult for Vancomycin Indication: pneumonia / MRSA BACTEREMIA  Allergies  Allergen Reactions  . Albuterol Palpitations  . Influenza Vaccine Live Swelling   Patient Measurements: Height: '5\' 8"'$  (172.7 cm) Weight: 141 lb 5 oz (64.1 kg) IBW/kg (Calculated) : 68.4  Vital Signs: Temp: 97.1 F (36.2 C) (01/15 0817) Temp Source: Oral (01/15 0817) BP: 121/69 mmHg (01/15 0900) Pulse Rate: 82 (01/15 0900) Intake/Output from previous day: 01/14 0701 - 01/15 0700 In: 1410 [P.O.:960; IV Piggyback:450] Out: 4800 [Urine:4800] Intake/Output from this shift: Total I/O In: 480 [P.O.:480] Out: -   Labs:  Recent Labs  07/27/15 0435 07/28/15 0500  WBC 21.3* 16.2*  HGB 10.4* 9.6*  PLT 297 240  CREATININE 1.17 1.04   Estimated Creatinine Clearance: 71.9 mL/min (by C-G formula based on Cr of 1.04).  Recent Labs  07/29/15 0745  [place] 14    Microbiology: Recent Results (from the past 720 hour(s))  MRSA PCR Screening     Status: Abnormal   Collection Time: 07/25/15  7:40 AM  Result Value Ref Range Status   MRSA by PCR POSITIVE (A) NEGATIVE Final    Comment: RESULT CALLED TO, READ BACK BY AND VERIFIED WITH: SCHENOWITZ,L AT 1023 BY HUFFINES,S ON 07/25/15.        The GeneXpert MRSA Assay (FDA approved for NASAL specimens only), is one component of a comprehensive MRSA colonization surveillance program. It is not intended to diagnose MRSA infection nor to guide or monitor treatment for MRSA infections.   Culture, blood (Routine X 2) w Reflex to ID Panel     Status: None   Collection Time: 07/25/15  8:30 AM  Result Value Ref Range Status   Specimen Description BLOOD LEFT ARM  Final   Special Requests BOTTLES DRAWN AEROBIC AND ANAEROBIC 10 CC EACH  Final   Culture  Setup Time   Final    GRAM POSITIVE COCCI IN CLUSTERS Gram Stain Report Called to,Read Back By and Verified With: HEARNS J AT 0425 ON [id number] BY FORSYTH K AEROBIC  BOTTLE ONLY    Culture   Final    STAPHYLOCOCCUS AUREUS SUSCEPTIBILITIES PERFORMED ON PREVIOUS CULTURE WITHIN THE LAST 5 DAYS. Performed at [facility]    Report Status 07/28/2015 FINAL  Final  Culture, blood (Routine X 2) w Reflex to ID Panel     Status: None   Collection Time: 07/25/15  8:35 AM  Result Value Ref Range Status   Specimen Description BLOOD LEFT HAND  Final   Special Requests BOTTLES DRAWN AEROBIC AND ANAEROBIC 10 CC EACH  Final   Culture  Setup Time   Final    GRAM POSITIVE COCCI IN CLUSTERS Gram Stain Report Called to,Read Back By and Verified With: HEARN J AT 0425 ON [id number] BY FORSYTH K IN BOTH AEROBIC AND ANAEROBIC BOTTLES    Culture   Final    METHICILLIN RESISTANT STAPHYLOCOCCUS AUREUS Performed at [facility]    Report Status 07/28/2015 FINAL  Final   Organism ID, Bacteria METHICILLIN RESISTANT STAPHYLOCOCCUS AUREUS  Final      Susceptibility   Methicillin resistant staphylococcus aureus - MIC*    CIPROFLOXACIN >=8 RESISTANT Resistant     ERYTHROMYCIN >=8 RESISTANT Resistant     GENTAMICIN <=0.5 SENSITIVE Sensitive     OXACILLIN >=4 RESISTANT Resistant     TETRACYCLINE <=1 SENSITIVE Sensitive     VANCOMYCIN 1 SENSITIVE Sensitive     TRIMETH/SULFA <=10 SENSITIVE Sensitive  CLINDAMYCIN <=0.25 SENSITIVE Sensitive     RIFAMPIN <=0.5 SENSITIVE Sensitive     Inducible Clindamycin NEGATIVE Sensitive     * METHICILLIN RESISTANT STAPHYLOCOCCUS AUREUS  Culture, blood (Routine X 2) w Reflex to ID Panel     Status: None (Preliminary result)   Collection Time: 07/28/15  4:57 AM  Result Value Ref Range Status   Specimen Description BLOOD RIGHT ARM  Final   Special Requests BOTTLES DRAWN AEROBIC AND ANAEROBIC 6CC  Final   Culture NO GROWTH 1 DAY  Final   Report Status PENDING  Incomplete  Culture, blood (Routine X 2) w Reflex to ID Panel     Status: None (Preliminary result)   Collection Time: 07/28/15  5:05 AM  Result Value Ref Range Status    Specimen Description BLOOD RIGHT HAND  Final   Special Requests BOTTLES DRAWN AEROBIC AND ANAEROBIC 4CC  Final   Culture NO GROWTH 1 DAY  Final   Report Status PENDING  Incomplete   Medical History: Past Medical History  Diagnosis Date  . COPD (chronic obstructive pulmonary disease) ([place])   . Hypercholesteremia   . History of pneumonia   . Polycythemia   . Coronary atherosclerosis of native coronary artery     Mild nonobstructive disease at catheterization 2007  . 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 Borderline diabetes   . Seizures ([place])     Last seizure 2 yrs ago  . GERD (gastroesophageal reflux disease)   . Type 2 diabetes mellitus ([place])   . DDD (degenerative disc disease)     Cervical and thoracic  . Chronic back pain   . Bronchitis   . Chronic left shoulder pain   . Chronic neck pain   . Atrial fibrillation ([place])     Not anticoagulated  . Smoker   . Type 2 diabetes mellitus without complication ([place])   . CAP (community acquired pneumonia) 07/10/2013    04/2015  . Collagen vascular disease ([place])   . Chronic respiratory failure (H[facility])    Anti-infectives    Start     Dose/Rate Route Frequency Ordered Stop   07/29/15 2200  vancomycin (VANCOCIN) IVPB 1000 mg/200 mL premix     1,000 mg 200 mL/hr over 60 Minutes Intravenous Every 8 hours 07/29/15 1320     07/25/15 2200  vancomycin (VANCOCIN) IVPB 1000 mg/200 mL premix  Status:  Discontinued     1,000 mg 200 mL/hr over 60 Minutes Intravenous Every 12 hours 07/25/15 0742 07/29/15 1320   07/25/15 1000  vancomycin (VANCOCIN) 1,500 mg in sodium chloride 0.9 % 500 mL IVPB     1,500 mg 250 mL/hr over 120 Minutes Intravenous  Once 07/25/15 0739 07/25/15 1300   07/25/15 0800  piperacillin-tazobactam (ZOSYN) IVPB 3.375 g  Status:  Discontinued     3.375 g 12.5 mL/hr over 240 Minutes Intravenous Every 8 hours 07/25/15 0738 07/28/15 1017   07/17/15 1000  azithromycin (ZITHROMAX) tablet 250 mg      250 mg Oral Daily 07/15/15 2212 07/20/15 0833   07/16/15 1000  azithromycin (ZITHROMAX) tablet 500 mg     500 mg Oral Daily 07/15/15 2212 07/16/15 1050     Assessment: 55yo male with worsening respiratory distress.  Transferred to ICU, asked to initiate Vancomycin and Zosyn for pna.  Subsequently developed healthcare associated MRSA bacteremia while hospitalized for an acute exacerbation of his COPD. He also appears to have a new right lower lobe infiltrate  with associated pleural effusion.  Appreciate ID input, ABX deescalated to Vancomycin monotherapy.  Trough level is below goal, excellent Vancomycin clearance.  Estimated Creatinine Clearance: 71.9 mL/min (by C-G formula based on Cr of 1.04).  Pharmacokinetic dosing service      Vancomycin single level analysis: Current dose being given: 1000 mg Current dosing interval:  12 hrs Current infusion time (hrs): 1  Single level Trough Data:  Trough level obtained: 14 mcg/ml Timing of trough - Number of hours since last dose:  9 Hrs Desired peak:  Desired trough: 15-20 mcg/ml  Estimated PK Parameters: --------------------------- New rate constant (kel): 0.106 hr-1 New half-life: 6.54 Hours New Vd from levels: 44.87  Liters  (0.7 L/kg)  Renal function is stable  Recommendations: ==================== Give Vancomycin  1000 mg  q 8 hrs. Infuse over 1 hrs Expected Ctrough: 17.6 mcg/ml  Recommended labs and intervals: Measure Bun and Scr 3 times/week.   Thank you for the consult, will continue to follow.  Goal of Therapy:  Vancomycin trough level 15-20 mcg/ml  Plan:  Increase Vancomycin to '1000mg'$  IV q8hrs Check trough level weekly or sooner if warranted Monitor labs, BUN / SCR 3 times/week, progress and c/s APPRECIATE ID ASSISTANCE / INPUT  [place] Crane, Hanae Waiters A 07/29/2015,1:21 PM

## 2015-07-29 NOTE — Progress Notes (Signed)
Subjective: Patient is much better. His breathing has improved. He is tolerateing regular diet.t  Objective: Vital signs in last 24 hours: Temp:  [96.7 F (35.9 C)-97.4 F (36.3 C)] 97.1 F (36.2 C) (01/15 0817) Pulse Rate:  [64-119] 66 (01/15 0600) Resp:  [16-28] 27 (01/15 0600) BP: (104-142)/(63-113) 116/66 mmHg (01/15 0600) SpO2:  [84 %-93 %] 91 % (01/15 0808) Weight:  [64.1 kg (141 lb 5 oz)] 64.1 kg (141 lb 5 oz) (01/15 0500) Weight change: -1.5 kg (-3 lb 4.9 oz) Last BM Date: 07/26/15  Intake/Output from previous day: 01/14 0701 - 01/15 0700 In: 1410 [P.O.:960; IV Piggyback:450] Out: 4800 [Urine:4800]  PHYSICAL EXAM General appearance: alert and no distress Resp: diminished breath sounds bilaterally and rhonchi bilaterally Cardio: S1, S2 normal GI: soft, non-tender; bowel sounds normal; no masses,  no organomegaly Extremities: extremities normal, atraumatic, no cyanosis or edema  Lab Results:  Results for orders placed or performed during the hospital encounter of 07/15/15 (from the past 48 hour(s))  Glucose, capillary     Status: Abnormal   Collection Time: 07/27/15 11:24 AM  Result Value Ref Range   Glucose-Capillary 406 (H) 65 - 99 mg/dL  Glucose, capillary     Status: Abnormal   Collection Time: 07/27/15  4:23 PM  Result Value Ref Range   Glucose-Capillary 232 (H) 65 - 99 mg/dL  Glucose, capillary     Status: Abnormal   Collection Time: 07/27/15  8:23 PM  Result Value Ref Range   Glucose-Capillary 336 (H) 65 - 99 mg/dL   Comment 1 Notify RN    Comment 2 Document in Chart   Glucose, capillary     Status: Abnormal   Collection Time: 07/28/15 12:11 AM  Result Value Ref Range   Glucose-Capillary 174 (H) 65 - 99 mg/dL   Comment 1 Notify RN    Comment 2 Document in Chart   Culture, blood (Routine X 2) w Reflex to ID Panel     Status: None (Preliminary result)   Collection Time: 07/28/15  4:57 AM  Result Value Ref Range   Specimen Description BLOOD RIGHT ARM     Special Requests BOTTLES DRAWN AEROBIC AND ANAEROBIC 6CC    Culture NO GROWTH 1 DAY    Report Status PENDING   CBC with Differential/Platelet     Status: Abnormal   Collection Time: 07/28/15  5:00 AM  Result Value Ref Range   WBC 16.2 (H) 4.0 - 10.5 K/uL    Comment: ATYPICAL LYMPHOCYTES   RBC 3.99 (L) 4.22 - 5.81 MIL/uL   Hemoglobin 9.6 (L) 13.0 - 17.0 g/dL   HCT 29.3 (L) 39.0 - 52.0 %   MCV 73.4 (L) 78.0 - 100.0 fL   MCH 24.1 (L) 26.0 - 34.0 pg   MCHC 32.8 30.0 - 36.0 g/dL   RDW 17.8 (H) 11.5 - 15.5 %   Platelets 240 150 - 400 K/uL    Comment: SPECIMEN CHECKED FOR CLOTS PLATELET COUNT CONFIRMED BY SMEAR    Neutrophils Relative % 87 %   Neutro Abs 14.0 (H) 1.7 - 7.7 K/uL   Lymphocytes Relative 7 %   Lymphs Abs 1.2 0.7 - 4.0 K/uL   Monocytes Relative 6 %   Monocytes Absolute 1.0 0.1 - 1.0 K/uL   Eosinophils Relative 0 %   Eosinophils Absolute 0.0 0.0 - 0.7 K/uL   Basophils Relative 0 %   Basophils Absolute 0.0 0.0 - 0.1 K/uL  Basic metabolic panel     Status: Abnormal  Collection Time: 07/28/15  5:00 AM  Result Value Ref Range   Sodium 138 135 - 145 mmol/L   Potassium 3.5 3.5 - 5.1 mmol/L   Chloride 90 (L) 101 - 111 mmol/L   CO2 38 (H) 22 - 32 mmol/L   Glucose, Bld 262 (H) 65 - 99 mg/dL   BUN 34 (H) 6 - 20 mg/dL   Creatinine, Ser 1.04 0.61 - 1.24 mg/dL   Calcium 8.0 (L) 8.9 - 10.3 mg/dL   GFR calc non Af Amer >60 >60 mL/min   GFR calc Af Amer >60 >60 mL/min    Comment: (NOTE) The eGFR has been calculated using the CKD EPI equation. This calculation has not been validated in all clinical situations. eGFR's persistently <60 mL/min signify possible Chronic Kidney Disease.    Anion gap 10 5 - 15  Culture, blood (Routine X 2) w Reflex to ID Panel     Status: None (Preliminary result)   Collection Time: 07/28/15  5:05 AM  Result Value Ref Range   Specimen Description BLOOD RIGHT HAND    Special Requests BOTTLES DRAWN AEROBIC AND ANAEROBIC 4CC    Culture NO GROWTH 1  DAY    Report Status PENDING   Glucose, capillary     Status: Abnormal   Collection Time: 07/28/15  5:16 AM  Result Value Ref Range   Glucose-Capillary 259 (H) 65 - 99 mg/dL   Comment 1 Notify RN    Comment 2 Document in Chart   Glucose, capillary     Status: Abnormal   Collection Time: 07/28/15  7:55 AM  Result Value Ref Range   Glucose-Capillary 294 (H) 65 - 99 mg/dL   Comment 1 Notify RN    Comment 2 Document in Chart   Glucose, capillary     Status: Abnormal   Collection Time: 07/28/15 11:41 AM  Result Value Ref Range   Glucose-Capillary 394 (H) 65 - 99 mg/dL   Comment 1 Notify RN    Comment 2 Document in Chart   Glucose, capillary     Status: Abnormal   Collection Time: 07/28/15  4:41 PM  Result Value Ref Range   Glucose-Capillary 187 (H) 65 - 99 mg/dL   Comment 1 Notify RN    Comment 2 Document in Chart   Glucose, capillary     Status: Abnormal   Collection Time: 07/28/15  9:12 PM  Result Value Ref Range   Glucose-Capillary 384 (H) 65 - 99 mg/dL  Vancomycin, trough     Status: None   Collection Time: 07/29/15  7:45 AM  Result Value Ref Range   Vancomycin Tr 14 10.0 - 20.0 ug/mL  Glucose, capillary     Status: Abnormal   Collection Time: 07/29/15  7:47 AM  Result Value Ref Range   Glucose-Capillary 383 (H) 65 - 99 mg/dL   Comment 1 Notify RN    Comment 2 Document in Chart     ABGS  Recent Labs  07/26/15 1205  PHART 7.544*  PO2ART 54.2*  HCO3 34.3*   CULTURES Recent Results (from the past 240 hour(s))  MRSA PCR Screening     Status: Abnormal   Collection Time: 07/25/15  7:40 AM  Result Value Ref Range Status   MRSA by PCR POSITIVE (A) NEGATIVE Final    Comment: RESULT CALLED TO, READ BACK BY AND VERIFIED WITH: SCHENOWITZ,L AT 1023 BY HUFFINES,S ON 07/25/15.        The GeneXpert MRSA Assay (FDA approved for NASAL specimens only),  is one component of a comprehensive MRSA colonization surveillance program. It is not intended to diagnose  MRSA infection nor to guide or monitor treatment for MRSA infections.   Culture, blood (Routine X 2) w Reflex to ID Panel     Status: None   Collection Time: 07/25/15  8:30 AM  Result Value Ref Range Status   Specimen Description BLOOD LEFT ARM  Final   Special Requests BOTTLES DRAWN AEROBIC AND ANAEROBIC 10 CC EACH  Final   Culture  Setup Time   Final    GRAM POSITIVE COCCI IN CLUSTERS Gram Stain Report Called to,Read Back By and Verified With: HEARNS J AT 0425 ON [id number] BY FORSYTH K AEROBIC BOTTLE ONLY    Culture   Final    STAPHYLOCOCCUS AUREUS SUSCEPTIBILITIES PERFORMED ON PREVIOUS CULTURE WITHIN THE LAST 5 DAYS. Performed at [facility]    Report Status 07/28/2015 FINAL  Final  Culture, blood (Routine X 2) w Reflex to ID Panel     Status: None   Collection Time: 07/25/15  8:35 AM  Result Value Ref Range Status   Specimen Description BLOOD LEFT HAND  Final   Special Requests BOTTLES DRAWN AEROBIC AND ANAEROBIC 10 CC EACH  Final   Culture  Setup Time   Final    GRAM POSITIVE COCCI IN CLUSTERS Gram Stain Report Called to,Read Back By and Verified With: HEARN J AT 0425 ON [id number] BY FORSYTH K IN BOTH AEROBIC AND ANAEROBIC BOTTLES    Culture   Final    METHICILLIN RESISTANT STAPHYLOCOCCUS AUREUS Performed at [facility]    Report Status 07/28/2015 FINAL  Final   Organism ID, Bacteria METHICILLIN RESISTANT STAPHYLOCOCCUS AUREUS  Final      Susceptibility   Methicillin resistant staphylococcus aureus - MIC*    CIPROFLOXACIN >=8 RESISTANT Resistant     ERYTHROMYCIN >=8 RESISTANT Resistant     GENTAMICIN <=0.5 SENSITIVE Sensitive     OXACILLIN >=4 RESISTANT Resistant     TETRACYCLINE <=1 SENSITIVE Sensitive     VANCOMYCIN 1 SENSITIVE Sensitive     TRIMETH/SULFA <=10 SENSITIVE Sensitive     CLINDAMYCIN <=0.25 SENSITIVE Sensitive     RIFAMPIN <=0.5 SENSITIVE Sensitive     Inducible Clindamycin NEGATIVE Sensitive     * METHICILLIN RESISTANT STAPHYLOCOCCUS  AUREUS  Culture, blood (Routine X 2) w Reflex to ID Panel     Status: None (Preliminary result)   Collection Time: 07/28/15  4:57 AM  Result Value Ref Range Status   Specimen Description BLOOD RIGHT ARM  Final   Special Requests BOTTLES DRAWN AEROBIC AND ANAEROBIC 6CC  Final   Culture NO GROWTH 1 DAY  Final   Report Status PENDING  Incomplete  Culture, blood (Routine X 2) w Reflex to ID Panel     Status: None (Preliminary result)   Collection Time: 07/28/15  5:05 AM  Result Value Ref Range Status   Specimen Description BLOOD RIGHT HAND  Final   Special Requests BOTTLES DRAWN AEROBIC AND ANAEROBIC 4CC  Final   Culture NO GROWTH 1 DAY  Final   Report Status PENDING  Incomplete   Studies/Results: No results found.  Medications: I have reviewed the patient's current medications.  Assesment:   Principal Problem:   MRSA bacteremia Active Problems:   Hyperlipidemia   Type 2 diabetes mellitus without complication ([place])   Cavitary lesion of lung   Decompensated COPD with exacerbation (chronic obstructive pulmonary disease) ([place])   Acute on chronic respiratory failure ([place])  COPD exacerbation ([facility])   Sepsis ([place])   Lobar pneumonia ([place])   HAP (hospital-acquired pneumonia)    Plan:  Medications reviewed Continue IV antibiotics Continue oxygen therapy nebulizer Continue current treatment Will move him out of ICU    LOS: 14 days   Parlee Amescua 07/29/2015, 9:10 AM

## 2015-07-30 LAB — GLUCOSE, CAPILLARY
GLUCOSE-CAPILLARY: 380 mg/dL — AB (ref 65–99)
Glucose-Capillary: 300 mg/dL — ABNORMAL HIGH (ref 65–99)
Glucose-Capillary: 327 mg/dL — ABNORMAL HIGH (ref 65–99)

## 2015-07-30 LAB — BASIC METABOLIC PANEL
Anion gap: 7 (ref 5–15)
BUN: 36 mg/dL — AB (ref 6–20)
CHLORIDE: 88 mmol/L — AB (ref 101–111)
CO2: 42 mmol/L — ABNORMAL HIGH (ref 22–32)
Calcium: 7.7 mg/dL — ABNORMAL LOW (ref 8.9–10.3)
Creatinine, Ser: 0.85 mg/dL (ref 0.61–1.24)
GFR calc Af Amer: >60 mL/min (ref >60)
GLUCOSE: 308 mg/dL — AB (ref 65–99)
POTASSIUM: 2.9 mmol/L — AB (ref 3.5–5.1)
Sodium: 137 mmol/L (ref 135–145)

## 2015-07-30 LAB — CBC
HCT: 26.7 % — ABNORMAL LOW (ref 39.0–52.0)
Hemoglobin: 8.6 g/dL — ABNORMAL LOW (ref 13.0–17.0)
MCH: 23.9 pg — AB (ref 26.0–34.0)
MCHC: 32.2 g/dL (ref 30.0–36.0)
MCV: 74.2 fL — AB (ref 78.0–100.0)
PLATELETS: 156 10*3/uL (ref 150–400)
RBC: 3.6 MIL/uL — AB (ref 4.22–5.81)
RDW: 17.5 % — ABNORMAL HIGH (ref 11.5–15.5)
WBC: 12.4 10*3/uL — ABNORMAL HIGH (ref 4.0–10.5)

## 2015-07-30 MED ORDER — OXYCODONE-ACETAMINOPHEN 10-325 MG PO TABS: 1.0000 | ORAL_TABLET | ORAL | Status: DC | PRN

## 2015-07-30 MED ORDER — BACLOFEN 10 MG PO TABS
10.0000 mg | ORAL_TABLET | Freq: Three times a day (TID) | ORAL | Status: DC | PRN
  Administered 2015-07-30: 10 mg via ORAL
  Filled 2015-07-30 (×2): qty 1

## 2015-07-30 MED ORDER — METHYLPREDNISOLONE SODIUM SUCC 40 MG IJ SOLR
40.0000 mg | Freq: Four times a day (QID) | INTRAMUSCULAR | Status: DC
  Administered 2015-07-30 – 2015-07-31 (×4): 40 mg via INTRAVENOUS
  Filled 2015-07-30 (×5): qty 1

## 2015-07-30 MED ORDER — OXYCODONE-ACETAMINOPHEN 5-325 MG PO TABS
1.0000 | ORAL_TABLET | ORAL | Status: DC | PRN
  Administered 2015-07-30 – 2015-07-31 (×4): 1 via ORAL
  Filled 2015-07-30 (×4): qty 1

## 2015-07-30 MED ORDER — OXYCODONE HCL 5 MG PO TABS
5.0000 mg | ORAL_TABLET | ORAL | Status: DC | PRN
  Administered 2015-07-30 – 2015-07-31 (×4): 5 mg via ORAL
  Filled 2015-07-30 (×4): qty 1

## 2015-07-30 NOTE — Progress Notes (Signed)
Subjective: He is markedly improved. He was able to be extubated on the 12th but then was found to have positive blood cultures with MRSA bacteremia. Infectious disease input is noted and appreciated. He says he feels well. He is on nasal oxygen.  Objective: Vital signs in last 24 hours: Temp:  [97 F (36.1 C)-98.4 F (36.9 C)] 97 F (36.1 C) (01/16 0400) Pulse Rate:  [57-107] 57 (01/16 0600) Resp:  [10-27] 16 (01/16 0600) BP: (98-134)/(48-80) 98/48 mmHg (01/16 0600) SpO2:  [87 %-96 %] 95 % (01/16 0600) Weight:  [63.3 kg (139 lb 8.8 oz)] 63.3 kg (139 lb 8.8 oz) (01/16 0500) Weight change: -0.8 kg (-1 lb 12.2 oz) Last BM Date: 07/28/15  Intake/Output from previous day: 01/15 0701 - 01/16 0700 In: 1560 [P.O.:960; IV Piggyback:600] Out: 4775 [Urine:4775]  PHYSICAL EXAM General appearance: alert, cooperative and no distress Resp: rhonchi bilaterally Cardio: He seems to be in sinus rhythm now. I don't hear a gallop GI: soft, non-tender; bowel sounds normal; no masses,  no organomegaly Extremities: extremities normal, atraumatic, no cyanosis or edema  Lab Results:  Results for orders placed or performed during the hospital encounter of 07/15/15 (from the past 48 hour(s))  Glucose, capillary     Status: Abnormal   Collection Time: 07/28/15  7:55 AM  Result Value Ref Range   Glucose-Capillary 294 (H) 65 - 99 mg/dL   Comment 1 Notify RN    Comment 2 Document in Chart   Glucose, capillary     Status: Abnormal   Collection Time: 07/28/15 11:41 AM  Result Value Ref Range   Glucose-Capillary 394 (H) 65 - 99 mg/dL   Comment 1 Notify RN    Comment 2 Document in Chart   Glucose, capillary     Status: Abnormal   Collection Time: 07/28/15  4:41 PM  Result Value Ref Range   Glucose-Capillary 187 (H) 65 - 99 mg/dL   Comment 1 Notify RN    Comment 2 Document in Chart   Glucose, capillary     Status: Abnormal   Collection Time: 07/28/15  9:12 PM  Result Value Ref Range   Glucose-Capillary 384 (H) 65 - 99 mg/dL  Vancomycin, trough     Status: None   Collection Time: 07/29/15  7:45 AM  Result Value Ref Range   Vancomycin Tr 14 10.0 - 20.0 ug/mL  Glucose, capillary     Status: Abnormal   Collection Time: 07/29/15  7:47 AM  Result Value Ref Range   Glucose-Capillary 383 (H) 65 - 99 mg/dL   Comment 1 Notify RN    Comment 2 Document in Chart   Glucose, capillary     Status: Abnormal   Collection Time: 07/29/15 11:55 AM  Result Value Ref Range   Glucose-Capillary 456 (H) 65 - 99 mg/dL  Glucose, capillary     Status: Abnormal   Collection Time: 07/29/15  4:29 PM  Result Value Ref Range   Glucose-Capillary 231 (H) 65 - 99 mg/dL   Comment 1 Notify RN    Comment 2 Document in Chart   Glucose, capillary     Status: Abnormal   Collection Time: 07/29/15  9:56 PM  Result Value Ref Range   Glucose-Capillary 320 (H) 65 - 99 mg/dL  CBC     Status: Abnormal   Collection Time: 07/30/15  5:15 AM  Result Value Ref Range   WBC 12.4 (H) 4.0 - 10.5 K/uL   RBC 3.60 (L) 4.22 - 5.81 MIL/uL  Hemoglobin 8.6 (L) 13.0 - 17.0 g/dL   HCT 26.7 (L) 39.0 - 52.0 %   MCV 74.2 (L) 78.0 - 100.0 fL   MCH 23.9 (L) 26.0 - 34.0 pg   MCHC 32.2 30.0 - 36.0 g/dL   RDW 17.5 (H) 11.5 - 15.5 %   Platelets 156 150 - 400 K/uL  Basic metabolic panel     Status: Abnormal   Collection Time: 07/30/15  5:15 AM  Result Value Ref Range   Sodium 137 135 - 145 mmol/L   Potassium 2.9 (L) 3.5 - 5.1 mmol/L   Chloride 88 (L) 101 - 111 mmol/L   CO2 42 (H) 22 - 32 mmol/L   Glucose, Bld 308 (H) 65 - 99 mg/dL   BUN 36 (H) 6 - 20 mg/dL   Creatinine, Ser 0.85 0.61 - 1.24 mg/dL   Calcium 7.7 (L) 8.9 - 10.3 mg/dL   GFR calc non Af Amer >60 >60 mL/min   GFR calc Af Amer >60 >60 mL/min    Comment: (NOTE) The eGFR has been calculated using the CKD EPI equation. This calculation has not been validated in all clinical situations. eGFR's persistently <60 mL/min signify possible Chronic Kidney Disease.     Anion gap 7 5 - 15    ABGS No results for input(s): PHART, PO2ART, TCO2, HCO3 in the last 72 hours.  Invalid input(s): PCO2 CULTURES Recent Results (from the past 240 hour(s))  MRSA PCR Screening     Status: Abnormal   Collection Time: 07/25/15  7:40 AM  Result Value Ref Range Status   MRSA by PCR POSITIVE (A) NEGATIVE Final    Comment: RESULT CALLED TO, READ BACK BY AND VERIFIED WITH: SCHENOWITZ,L AT 1023 BY HUFFINES,S ON 07/25/15.        The GeneXpert MRSA Assay (FDA approved for NASAL specimens only), is one component of a comprehensive MRSA colonization surveillance program. It is not intended to diagnose MRSA infection nor to guide or monitor treatment for MRSA infections.   Culture, blood (Routine X 2) w Reflex to ID Panel     Status: None   Collection Time: 07/25/15  8:30 AM  Result Value Ref Range Status   Specimen Description BLOOD LEFT ARM  Final   Special Requests BOTTLES DRAWN AEROBIC AND ANAEROBIC 10 CC EACH  Final   Culture  Setup Time   Final    GRAM POSITIVE COCCI IN CLUSTERS Gram Stain Report Called to,Read Back By and Verified With: HEARNS J AT 0425 ON [id number] BY FORSYTH K AEROBIC BOTTLE ONLY    Culture   Final    STAPHYLOCOCCUS AUREUS SUSCEPTIBILITIES PERFORMED ON PREVIOUS CULTURE WITHIN THE LAST 5 DAYS. Performed at [facility]    Report Status 07/28/2015 FINAL  Final  Culture, blood (Routine X 2) w Reflex to ID Panel     Status: None   Collection Time: 07/25/15  8:35 AM  Result Value Ref Range Status   Specimen Description BLOOD LEFT HAND  Final   Special Requests BOTTLES DRAWN AEROBIC AND ANAEROBIC 10 CC EACH  Final   Culture  Setup Time   Final    GRAM POSITIVE COCCI IN CLUSTERS Gram Stain Report Called to,Read Back By and Verified With: HEARN J AT [place] ON [id number] BY FORSYTH K IN BOTH AEROBIC AND ANAEROBIC BOTTLES    Culture   Final    METHICILLIN RESISTANT STAPHYLOCOCCUS AUREUS Performed at [facility]    Report Status  07/28/2015 FINAL  Final  Organism ID, Bacteria METHICILLIN RESISTANT STAPHYLOCOCCUS AUREUS  Final      Susceptibility   Methicillin resistant staphylococcus aureus - MIC*    CIPROFLOXACIN >=8 RESISTANT Resistant     ERYTHROMYCIN >=8 RESISTANT Resistant     GENTAMICIN <=0.5 SENSITIVE Sensitive     OXACILLIN >=4 RESISTANT Resistant     TETRACYCLINE <=1 SENSITIVE Sensitive     VANCOMYCIN 1 SENSITIVE Sensitive     TRIMETH/SULFA <=10 SENSITIVE Sensitive     CLINDAMYCIN <=0.25 SENSITIVE Sensitive     RIFAMPIN <=0.5 SENSITIVE Sensitive     Inducible Clindamycin NEGATIVE Sensitive     * METHICILLIN RESISTANT STAPHYLOCOCCUS AUREUS  Culture, blood (Routine X 2) w Reflex to ID Panel     Status: None (Preliminary result)   Collection Time: 07/28/15  4:57 AM  Result Value Ref Range Status   Specimen Description BLOOD RIGHT ARM  Final   Special Requests BOTTLES DRAWN AEROBIC AND ANAEROBIC 6CC  Final   Culture NO GROWTH 1 DAY  Final   Report Status PENDING  Incomplete  Culture, blood (Routine X 2) w Reflex to ID Panel     Status: None (Preliminary result)   Collection Time: 07/28/15  5:05 AM  Result Value Ref Range Status   Specimen Description BLOOD RIGHT HAND  Final   Special Requests BOTTLES DRAWN AEROBIC AND ANAEROBIC 4CC  Final   Culture NO GROWTH 1 DAY  Final   Report Status PENDING  Incomplete   Studies/Results: No results found.  Medications:  Prior to Admission:  Prescriptions prior to admission  Medication Sig Dispense Refill Last Dose  . albuterol (VENTOLIN HFA) 108 (90 BASE) MCG/ACT inhaler Inhale 2 puffs into the lungs every 6 (six) hours as needed for wheezing or shortness of breath.   Past Week  . benzonatate (TESSALON) 100 MG capsule Take 1 capsule (100 mg total) by mouth 3 (three) times daily as needed for cough. 30 capsule 0 07/15/2015  . budesonide-formoterol (SYMBICORT) 160-4.5 MCG/ACT inhaler Inhale 2 puffs into the lungs 2 (two) times daily.   07/14/2015  .  Dextromethorphan-Guaifenesin 20-400 MG TABS Take 1 tablet by mouth every 6 (six) hours as needed (mucus).   Past Week  . diltiazem (CARDIZEM CD) 120 MG 24 hr capsule Take 1 capsule (120 mg total) by mouth daily. 30 capsule 11 07/15/2015  . fluticasone (FLONASE) 50 MCG/ACT nasal spray Place 2 sprays into both nostrils daily.   07/14/2015  . LORazepam (ATIVAN) 0.5 MG tablet Take 1 tablet (0.5 mg total) by mouth 2 (two) times daily as needed for anxiety or sleep. 20 tablet 0 07/15/2015  . metFORMIN (GLUCOPHAGE) 500 MG tablet Take 1 tablet (500 mg total) by mouth daily with breakfast. 30 tablet 3 07/15/2015  . montelukast (SINGULAIR) 10 MG tablet Take 10 mg by mouth at bedtime.   07/14/2015  . oxyCODONE-acetaminophen (PERCOCET) 10-325 MG tablet Take 1 tablet by mouth every 4 (four) hours as needed for pain.   07/14/2015  . oxyCODONE-acetaminophen (ROXICET) 5-325 MG per tablet Take 1 tablet by mouth every 4 (four) hours as needed for severe pain. 25 tablet 0 07/14/2015  . pantoprazole (PROTONIX) 40 MG tablet Take 1 tablet (40 mg total) by mouth 2 (two) times daily. 60 tablet 0 07/15/2015  . promethazine-dextromethorphan (PROMETHAZINE-DM) 6.25-15 MG/5ML syrup Take 5 mLs by mouth 4 (four) times daily as needed for cough.   07/14/2015  . SPIRIVA RESPIMAT 2.5 MCG/ACT AERS Inhale 1 puff into the lungs daily.   07/15/2015  .  baclofen (LIORESAL) 10 MG tablet Take 10 mg by mouth 3 (three) times daily as needed.   Unknown  . edoxaban (SAVAYSA) 60 MG TABS tablet Take 60 mg by mouth daily. 30 tablet 11 Couple Weeks  . levalbuterol (XOPENEX) 0.63 MG/3ML nebulizer solution Take 3 mLs (0.63 mg total) by nebulization 3 (three) times daily. (Patient taking differently: Take 0.63 mg by nebulization every 8 (eight) hours as needed for wheezing or shortness of breath. ) 3 mL 12 07/12/2015  . nicotine (NICODERM CQ - DOSED IN MG/24 HOURS) 21 mg/24hr patch Place 1 patch (21 mg total) onto the skin daily. 28 patch 0 Taking  .  nitroGLYCERIN (NITROSTAT) 0.4 MG SL tablet Place 1 tablet (0.4 mg total) under the tongue every 5 (five) minutes as needed for chest pain. 25 tablet 4 Never  . predniSONE (DELTASONE) 20 MG tablet Take 2 tablets (40 mg total) by mouth daily. 10 tablet 0    Scheduled: . edoxaban  60 mg Oral Daily  . furosemide  80 mg Intravenous Q12H  . insulin aspart  0-20 Units Subcutaneous TID WC  . insulin aspart  0-5 Units Subcutaneous QHS  . insulin glargine  30 Units Subcutaneous QHS  . ipratropium  0.5 mg Nebulization Q6H  . levalbuterol  0.63 mg Nebulization Q6H  . methylPREDNISolone (SOLU-MEDROL) injection  40 mg Intravenous Q6H  . nicotine  14 mg Transdermal Daily  . pantoprazole (PROTONIX) IV  40 mg Intravenous Daily  . sodium chloride  3 mL Intravenous Q12H  . sodium chloride  3 mL Intravenous Q12H  . vancomycin  1,000 mg Intravenous Q8H   Continuous:  NOB:SJGGEZ chloride, baclofen, fentaNYL (SUBLIMAZE) injection, LORazepam, nitroGLYCERIN, [DISCONTINUED] ondansetron **OR** ondansetron (ZOFRAN) IV, oxyCODONE-acetaminophen, sodium chloride  Assesment: He has MRSA bacteremia. He was admitted with COPD exacerbation and acute on chronic respiratory failure. He then developed hospital-acquired pneumonia which is lobar. He was septic from that. He looks much better. I think he probably aspirated. Principal Problem:   MRSA bacteremia Active Problems:   Hyperlipidemia   Type 2 diabetes mellitus without complication ([place])   Cavitary lesion of lung   Decompensated COPD with exacerbation (chronic obstructive pulmonary disease) (HCC)   Acute on chronic respiratory failure (HCC)   COPD exacerbation (HCC)   Sepsis (HCC)   Lobar pneumonia (HCC)   HAP (hospital-acquired pneumonia)    Plan: Transfer from ICU when monitored bed is available. Continue IV antibiotics with input per infectious disease as far as duration of therapy.    LOS: 15 days   Caedin Mogan L 07/30/2015, 7:27 AM

## 2015-07-30 NOTE — Progress Notes (Signed)
Report called to L. Rosana Hoes, Therapist, sports. Patient transferred to 303 in stable condition with RN via wheelchair.

## 2015-07-30 NOTE — Progress Notes (Signed)
Inpatient Diabetes Program Recommendations  AACE/ADA: New Consensus Statement on Inpatient Glycemic Control (2015)  Target Ranges:  Prepandial:   less than 140 mg/dL      Peak postprandial:   less than 180 mg/dL (1-2 hours)      Critically ill patients:  140 - 180 mg/dL   Review of Glycemic Control  Glucose remains high in 300's-400's. Noted solumedrol decreased to 40 mg q 6hrs. While on solumedrol,  patient may still potentially benefit from an increase in lantus to 35 units. Patient now eating 100% as well.  Thank you Rosita Kea, RN, MSN, CDE  Diabetes Inpatient Program Office: [phone] Pager: [phone] 8:00 am to 5:00 pm

## 2015-07-30 NOTE — Progress Notes (Signed)
Picc line slow to flush. Spoke with Nadine in ICU. She states "the gray port doesn't work."

## 2015-07-30 NOTE — Progress Notes (Addendum)
ANTIBIOTIC CONSULT NOTE - follow up  Pharmacy Consult for Vancomycin Indication: pneumonia / MRSA BACTEREMIA  Allergies  Allergen Reactions  . Albuterol Palpitations  . Influenza Vaccine Live Swelling   Patient Measurements: Height: '5\' 8"'$  (172.7 cm) Weight: 139 lb 8.8 oz (63.3 kg) IBW/kg (Calculated) : 68.4  Vital Signs: Temp: 97 F (36.1 C) (01/16 0400) Temp Source: Oral (01/16 0400) BP: 110/66 mmHg (01/16 0800) Pulse Rate: 67 (01/16 0800) Intake/Output from previous day: 01/15 0701 - 01/16 0700 In: 1560 [P.O.:960; IV Piggyback:600] Out: 4775 [Urine:4775] Intake/Output from this shift:    Labs:  Recent Labs  07/28/15 0500 07/30/15 0515  WBC 16.2* 12.4*  HGB 9.6* 8.6*  PLT 240 156  CREATININE 1.04 0.85   Estimated Creatinine Clearance: 86.9 mL/min (by C-G formula based on Cr of 0.85).  Recent Labs  07/29/15 0745  [place] [facility]    Microbiology: Recent Results (from the past 720 hour(s))  MRSA PCR Screening     Status: Abnormal   Collection Time: 07/25/15  7:40 AM  Result Value Ref Range Status   MRSA by PCR POSITIVE (A) NEGATIVE Final    Comment: RESULT CALLED TO, READ BACK BY AND VERIFIED WITH: SCHENOWITZ,L AT 1023 BY HUFFINES,S ON 07/25/15.        The GeneXpert MRSA Assay (FDA approved for NASAL specimens only), is one component of a comprehensive MRSA colonization surveillance program. It is not intended to diagnose MRSA infection nor to guide or monitor treatment for MRSA infections.   Culture, blood (Routine X 2) w Reflex to ID Panel     Status: None   Collection Time: 07/25/15  8:30 AM  Result Value Ref Range Status   Specimen Description BLOOD LEFT ARM  Final   Special Requests BOTTLES DRAWN AEROBIC AND ANAEROBIC 10 CC EACH  Final   Culture  Setup Time   Final    GRAM POSITIVE COCCI IN CLUSTERS Gram Stain Report Called to,Read Back By and Verified With: HEARNS J AT 0425 ON [id number] BY FORSYTH K AEROBIC BOTTLE ONLY    Culture   Final     STAPHYLOCOCCUS AUREUS SUSCEPTIBILITIES PERFORMED ON PREVIOUS CULTURE WITHIN THE LAST 5 DAYS. Performed at [facility]    Report Status 07/28/2015 FINAL  Final  Culture, blood (Routine X 2) w Reflex to ID Panel     Status: None   Collection Time: 07/25/15  8:35 AM  Result Value Ref Range Status   Specimen Description BLOOD LEFT HAND  Final   Special Requests BOTTLES DRAWN AEROBIC AND ANAEROBIC 10 CC EACH  Final   Culture  Setup Time   Final    GRAM POSITIVE COCCI IN CLUSTERS Gram Stain Report Called to,Read Back By and Verified With: HEARN J AT 0425 ON [id number] BY FORSYTH K IN BOTH AEROBIC AND ANAEROBIC BOTTLES    Culture   Final    METHICILLIN RESISTANT STAPHYLOCOCCUS AUREUS Performed at [facility]    Report Status 07/28/2015 FINAL  Final   Organism ID, Bacteria METHICILLIN RESISTANT STAPHYLOCOCCUS AUREUS  Final      Susceptibility   Methicillin resistant staphylococcus aureus - MIC*    CIPROFLOXACIN >=8 RESISTANT Resistant     ERYTHROMYCIN >=8 RESISTANT Resistant     GENTAMICIN <=0.5 SENSITIVE Sensitive     OXACILLIN >=4 RESISTANT Resistant     TETRACYCLINE <=1 SENSITIVE Sensitive     VANCOMYCIN 1 SENSITIVE Sensitive     TRIMETH/SULFA <=10 SENSITIVE Sensitive     CLINDAMYCIN <=0.25 SENSITIVE Sensitive  RIFAMPIN <=0.5 SENSITIVE Sensitive     Inducible Clindamycin NEGATIVE Sensitive     * METHICILLIN RESISTANT STAPHYLOCOCCUS AUREUS  Culture, blood (Routine X 2) w Reflex to ID Panel     Status: None (Preliminary result)   Collection Time: 07/28/15  4:57 AM  Result Value Ref Range Status   Specimen Description BLOOD RIGHT ARM  Final   Special Requests BOTTLES DRAWN AEROBIC AND ANAEROBIC 6CC  Final   Culture NO GROWTH 1 DAY  Final   Report Status PENDING  Incomplete  Culture, blood (Routine X 2) w Reflex to ID Panel     Status: None (Preliminary result)   Collection Time: 07/28/15  5:05 AM  Result Value Ref Range Status   Specimen Description BLOOD RIGHT HAND   Final   Special Requests BOTTLES DRAWN AEROBIC AND ANAEROBIC 4CC  Final   Culture NO GROWTH 1 DAY  Final   Report Status PENDING  Incomplete   Medical History: Past Medical History  Diagnosis Date  . COPD (chronic obstructive pulmonary disease) ([place])   . Hypercholesteremia   . History of pneumonia   . Polycythemia   . Coronary atherosclerosis of native coronary artery     Mild nonobstructive disease at catheterization 2007  . 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 Borderline diabetes   . Seizures ([place])     Last seizure 2 yrs ago  . GERD (gastroesophageal reflux disease)   . Type 2 diabetes mellitus ([place])   . DDD (degenerative disc disease)     Cervical and thoracic  . Chronic back pain   . Bronchitis   . Chronic left shoulder pain   . Chronic neck pain   . Atrial fibrillation ([place])     Not anticoagulated  . Smoker   . Type 2 diabetes mellitus without complication ([place])   . CAP (community acquired pneumonia) 07/10/2013    04/2015  . Collagen vascular disease ([place])   . Chronic respiratory failure (HCC)    Anti-infectives    Start     Dose/Rate Route Frequency Ordered Stop   07/29/15 2200  vancomycin (VANCOCIN) IVPB 1000 mg/200 mL premix     1,000 mg 200 mL/hr over 60 Minutes Intravenous Every 8 hours 07/29/15 1320     07/25/15 2200  vancomycin (VANCOCIN) IVPB 1000 mg/200 mL premix  Status:  Discontinued     1,000 mg 200 mL/hr over 60 Minutes Intravenous Every 12 hours 07/25/15 0742 07/29/15 1320   07/25/15 1000  vancomycin (VANCOCIN) 1,500 mg in sodium chloride 0.9 % 500 mL IVPB     1,500 mg 250 mL/hr over 120 Minutes Intravenous  Once 07/25/15 0739 07/25/15 1300   07/25/15 0800  piperacillin-tazobactam (ZOSYN) IVPB 3.375 g  Status:  Discontinued     3.375 g 12.5 mL/hr over 240 Minutes Intravenous Every 8 hours 07/25/15 0738 07/28/15 1017   07/17/15 1000  azithromycin (ZITHROMAX) tablet 250 mg     250 mg Oral Daily 07/15/15 2212  07/20/15 0833   07/16/15 1000  azithromycin (ZITHROMAX) tablet 500 mg     500 mg Oral Daily 07/15/15 2212 07/16/15 1050     Assessment: 55yo male with worsening respiratory distress.  Transferred to ICU, asked to initiate Vancomycin and Zosyn for pna.  Subsequently developed healthcare associated MRSA bacteremia while hospitalized for an acute exacerbation of his COPD. He also appears to have a new right lower lobe infiltrate with associated pleural effusion.  Appreciate ID input,  ABX deescalated to Vancomycin monotherapy.  Trough level is below goal, excellent Vancomycin clearance.  Renal fxn is stable.  Pt is now improved.   Estimated Creatinine Clearance: 86.9 mL/min (by C-G formula based on Cr of 0.85).  Pharmacokinetic dosing service     Vancomycin single level analysis: Current dose being given: 1000 mg Current dosing interval:  12 hrs Current infusion time (hrs): 1  Single level Trough Data:  Trough level obtained: 14 mcg/ml Timing of trough - Number of hours since last dose:  9 Hrs Desired peak:  Desired trough: 15-20 mcg/ml  Estimated PK Parameters: --------------------------- New rate constant (kel): 0.106 hr-1 New half-life: 6.54 Hours New Vd from levels: 44.87  Liters  (0.7 L/kg)  Renal function is stable  Recommendations: ==================== Give Vancomycin  1000 mg  q 8 hrs. Infuse over 1 hrs Expected Ctrough: 17.6 mcg/ml  Recommended labs and intervals: Measure Bun and Scr 3 times/week.   Thank you for the consult, will continue to follow.  Goal of Therapy:  Vancomycin trough level 15-20 mcg/ml  Plan:  Increase Vancomycin to '1000mg'$  IV q8hrs Check trough level weekly or sooner if warranted Monitor labs, BUN / SCR 3 times/week, progress and c/s APPRECIATE ID ASSISTANCE / INPUT  Hall, Scott A 07/30/2015,8:39 AM   Addum:  Will adjust vanc dose to 1250 mg IV q12 hours for ease of outpatient administration Rec weekly vanc trough Excell Seltzer, PharmD

## 2015-07-31 LAB — GLUCOSE, CAPILLARY
GLUCOSE-CAPILLARY: 190 mg/dL — AB (ref 65–99)
GLUCOSE-CAPILLARY: 261 mg/dL — AB (ref 65–99)
Glucose-Capillary: 360 mg/dL — ABNORMAL HIGH (ref 65–99)
Glucose-Capillary: 264 mg/dL — ABNORMAL HIGH (ref 65–99)

## 2015-07-31 MED ORDER — OXYCODONE HCL 5 MG PO TABS
5.0000 mg | ORAL_TABLET | ORAL | Status: DC | PRN
  Filled 2015-07-31: qty 1

## 2015-07-31 MED ORDER — SODIUM CHLORIDE 0.9 % IJ SOLN
10.0000 mL | Freq: Two times a day (BID) | INTRAMUSCULAR | Status: DC
  Administered 2015-07-31 – 2015-08-01 (×2): 10 mL

## 2015-07-31 MED ORDER — PREDNISONE 20 MG PO TABS
40.0000 mg | ORAL_TABLET | Freq: Every day | ORAL | Status: DC
  Administered 2015-07-31 – 2015-08-01 (×2): 40 mg via ORAL
  Filled 2015-07-31 (×2): qty 2

## 2015-07-31 MED ORDER — OXYCODONE-ACETAMINOPHEN 5-325 MG PO TABS
1.0000 | ORAL_TABLET | ORAL | Status: DC
  Administered 2015-07-31 – 2015-08-01 (×7): 1 via ORAL
  Filled 2015-07-31 (×7): qty 1

## 2015-07-31 MED ORDER — SODIUM CHLORIDE 0.9 % IV SOLN
1250.0000 mg | Freq: Two times a day (BID) | INTRAVENOUS | Status: DC
  Administered 2015-08-01: 1250 mg via INTRAVENOUS
  Filled 2015-07-31 (×4): qty 1250

## 2015-07-31 MED ORDER — DILTIAZEM HCL ER COATED BEADS 120 MG PO CP24
120.0000 mg | ORAL_CAPSULE | Freq: Every day | ORAL | Status: DC
  Administered 2015-07-31 – 2015-08-01 (×2): 120 mg via ORAL
  Filled 2015-07-31 (×2): qty 1

## 2015-07-31 MED ORDER — METFORMIN HCL 500 MG PO TABS
500.0000 mg | ORAL_TABLET | Freq: Every day | ORAL | Status: DC
  Administered 2015-07-31 – 2015-08-01 (×2): 500 mg via ORAL
  Filled 2015-07-31 (×2): qty 1

## 2015-07-31 MED ORDER — ALTEPLASE 2 MG IJ SOLR
2.0000 mg | Freq: Once | INTRAMUSCULAR | Status: AC | PRN
  Administered 2015-07-31: 2 mg
  Filled 2015-07-31 (×3): qty 2

## 2015-07-31 MED ORDER — ALTEPLASE 2 MG IJ SOLR
2.0000 mg | Freq: Once | INTRAMUSCULAR | Status: AC | PRN
  Administered 2015-07-31: 2 mg
  Filled 2015-07-31: qty 2

## 2015-07-31 MED ORDER — SODIUM CHLORIDE 0.9 % IJ SOLN
10.0000 mL | INTRAMUSCULAR | Status: DC | PRN
Start: 2015-07-31 — End: 2015-08-01

## 2015-07-31 NOTE — Progress Notes (Addendum)
Pt PICC lumens are not flushing. I tried x2 with no success. Pt was scheduled for solumedrol and vancomycin so I have started a peripheral in the left wrist.

## 2015-07-31 NOTE — Progress Notes (Signed)
Alteplace 2.2 cc's removed without diffuculty, flushed each port with NS 10 cc's easily.

## 2015-07-31 NOTE — Progress Notes (Addendum)
Alteplase 2 mg nserted in 2 ports of PICC line, inserted without difficulty.

## 2015-07-31 NOTE — Progress Notes (Signed)
Discussed with Dr. Linus Salmons from infectious disease. He suggests 4 weeks of IV vancomycin starting on the 14th. I will see if his PICC line can be cleared.

## 2015-07-31 NOTE — Progress Notes (Signed)
Subjective: He says he feels much better. No new complaints. His PICC line is not functioning. He is still coughing. He still has pain.  Objective: Vital signs in last 24 hours: Temp:  [97.1 F (36.2 C)-98 F (36.7 C)] 97.8 F (36.6 C) (01/17 0431) Pulse Rate:  [68-106] 71 (01/17 0431) Resp:  [18-33] 22 (01/16 2059) BP: (113-144)/(65-82) 138/74 mmHg (01/17 0431) SpO2:  [86 %-96 %] 96 % (01/17 0431) Weight:  [64.275 kg (141 lb 11.2 oz)] 64.275 kg (141 lb 11.2 oz) (01/17 0435) Weight change: 0.975 kg (2 lb 2.4 oz) Last BM Date: 07/29/15  Intake/Output from previous day: 01/16 0701 - 01/17 0700 In: 2150 [P.O.:1680; I.V.:70; IV Piggyback:400] Out: 900 [Urine:900]  PHYSICAL EXAM General appearance: alert, cooperative and He generally looks much better Resp: rhonchi bilaterally Cardio: regular rate and rhythm, S1, S2 normal, no murmur, click, rub or gallop GI: soft, non-tender; bowel sounds normal; no masses,  no organomegaly Extremities: extremities normal, atraumatic, no cyanosis or edema  Lab Results:  Results for orders placed or performed during the hospital encounter of 07/15/15 (from the past 48 hour(s))  Glucose, capillary     Status: Abnormal   Collection Time: 07/29/15 11:55 AM  Result Value Ref Range   Glucose-Capillary 456 (H) 65 - 99 mg/dL  Glucose, capillary     Status: Abnormal   Collection Time: 07/29/15  4:29 PM  Result Value Ref Range   Glucose-Capillary 231 (H) 65 - 99 mg/dL   Comment 1 Notify RN    Comment 2 Document in Chart   Glucose, capillary     Status: Abnormal   Collection Time: 07/29/15  9:56 PM  Result Value Ref Range   Glucose-Capillary 320 (H) 65 - 99 mg/dL  CBC     Status: Abnormal   Collection Time: 07/30/15  5:15 AM  Result Value Ref Range   WBC 12.4 (H) 4.0 - 10.5 K/uL   RBC 3.60 (L) 4.22 - 5.81 MIL/uL   Hemoglobin 8.6 (L) 13.0 - 17.0 g/dL   HCT 26.7 (L) 39.0 - 52.0 %   MCV 74.2 (L) 78.0 - 100.0 fL   MCH 23.9 (L) 26.0 - 34.0 pg   MCHC 32.2 30.0 - 36.0 g/dL   RDW 17.5 (H) 11.5 - 15.5 %   Platelets 156 150 - 400 K/uL  Basic metabolic panel     Status: Abnormal   Collection Time: 07/30/15  5:15 AM  Result Value Ref Range   Sodium 137 135 - 145 mmol/L   Potassium 2.9 (L) 3.5 - 5.1 mmol/L   Chloride 88 (L) 101 - 111 mmol/L   CO2 42 (H) 22 - 32 mmol/L   Glucose, Bld 308 (H) 65 - 99 mg/dL   BUN 36 (H) 6 - 20 mg/dL   Creatinine, Ser 0.85 0.61 - 1.24 mg/dL   Calcium 7.7 (L) 8.9 - 10.3 mg/dL   GFR calc non Af Amer >60 >60 mL/min   GFR calc Af Amer >60 >60 mL/min    Comment: (NOTE) The eGFR has been calculated using the CKD EPI equation. This calculation has not been validated in all clinical situations. eGFR's persistently <60 mL/min signify possible Chronic Kidney Disease.    Anion gap 7 5 - 15  Glucose, capillary     Status: Abnormal   Collection Time: 07/30/15  7:52 AM  Result Value Ref Range   Glucose-Capillary 380 (H) 65 - 99 mg/dL  Glucose, capillary     Status: Abnormal   Collection Time:  07/30/15 11:46 AM  Result Value Ref Range   Glucose-Capillary 300 (H) 65 - 99 mg/dL   Comment 1 Notify RN    Comment 2 Document in Chart   Glucose, capillary     Status: Abnormal   Collection Time: 07/30/15  4:21 PM  Result Value Ref Range   Glucose-Capillary 327 (H) 65 - 99 mg/dL   Comment 1 Notify RN    Comment 2 Document in Chart   Glucose, capillary     Status: Abnormal   Collection Time: 07/31/15  7:19 AM  Result Value Ref Range   Glucose-Capillary 360 (H) 65 - 99 mg/dL   Comment 1 Notify RN    Comment 2 Document in Chart     ABGS No results for input(s): PHART, PO2ART, TCO2, HCO3 in the last 72 hours.  Invalid input(s): PCO2 CULTURES Recent Results (from the past 240 hour(s))  MRSA PCR Screening     Status: Abnormal   Collection Time: 07/25/15  7:40 AM  Result Value Ref Range Status   MRSA by PCR POSITIVE (A) NEGATIVE Final    Comment: RESULT CALLED TO, READ BACK BY AND VERIFIED WITH: SCHENOWITZ,L  AT 1023 BY HUFFINES,S ON 07/25/15.        The GeneXpert MRSA Assay (FDA approved for NASAL specimens only), is one component of a comprehensive MRSA colonization surveillance program. It is not intended to diagnose MRSA infection nor to guide or monitor treatment for MRSA infections.   Culture, blood (Routine X 2) w Reflex to ID Panel     Status: None   Collection Time: 07/25/15  8:30 AM  Result Value Ref Range Status   Specimen Description BLOOD LEFT ARM  Final   Special Requests BOTTLES DRAWN AEROBIC AND ANAEROBIC 10 CC EACH  Final   Culture  Setup Time   Final    GRAM POSITIVE COCCI IN CLUSTERS Gram Stain Report Called to,Read Back By and Verified With: HEARNS J AT 0425 ON [id number] BY FORSYTH K AEROBIC BOTTLE ONLY    Culture   Final    STAPHYLOCOCCUS AUREUS SUSCEPTIBILITIES PERFORMED ON PREVIOUS CULTURE WITHIN THE LAST 5 DAYS. Performed at [facility]    Report Status 07/28/2015 FINAL  Final  Culture, blood (Routine X 2) w Reflex to ID Panel     Status: None   Collection Time: 07/25/15  8:35 AM  Result Value Ref Range Status   Specimen Description BLOOD LEFT HAND  Final   Special Requests BOTTLES DRAWN AEROBIC AND ANAEROBIC 10 CC EACH  Final   Culture  Setup Time   Final    GRAM POSITIVE COCCI IN CLUSTERS Gram Stain Report Called to,Read Back By and Verified With: HEARN J AT 0425 ON [id number] BY FORSYTH K IN BOTH AEROBIC AND ANAEROBIC BOTTLES    Culture   Final    METHICILLIN RESISTANT STAPHYLOCOCCUS AUREUS Performed at [facility]    Report Status 07/28/2015 FINAL  Final   Organism ID, Bacteria METHICILLIN RESISTANT STAPHYLOCOCCUS AUREUS  Final      Susceptibility   Methicillin resistant staphylococcus aureus - MIC*    CIPROFLOXACIN >=8 RESISTANT Resistant     ERYTHROMYCIN >=8 RESISTANT Resistant     GENTAMICIN <=0.5 SENSITIVE Sensitive     OXACILLIN >=4 RESISTANT Resistant     TETRACYCLINE <=1 SENSITIVE Sensitive     VANCOMYCIN 1 SENSITIVE Sensitive      TRIMETH/SULFA <=10 SENSITIVE Sensitive     CLINDAMYCIN <=0.25 SENSITIVE Sensitive     RIFAMPIN <=0.5  SENSITIVE Sensitive     Inducible Clindamycin NEGATIVE Sensitive     * METHICILLIN RESISTANT STAPHYLOCOCCUS AUREUS  Culture, blood (Routine X 2) w Reflex to ID Panel     Status: None (Preliminary result)   Collection Time: 07/28/15  4:57 AM  Result Value Ref Range Status   Specimen Description BLOOD RIGHT ARM  Final   Special Requests BOTTLES DRAWN AEROBIC AND ANAEROBIC 6CC  Final   Culture NO GROWTH 2 DAYS  Final   Report Status PENDING  Incomplete  Culture, blood (Routine X 2) w Reflex to ID Panel     Status: None (Preliminary result)   Collection Time: 07/28/15  5:05 AM  Result Value Ref Range Status   Specimen Description BLOOD RIGHT HAND  Final   Special Requests BOTTLES DRAWN AEROBIC AND ANAEROBIC 4CC  Final   Culture NO GROWTH 2 DAYS  Final   Report Status PENDING  Incomplete   Studies/Results: No results found.  Medications:  Prior to Admission:  Prescriptions prior to admission  Medication Sig Dispense Refill Last Dose  . albuterol (VENTOLIN HFA) 108 (90 BASE) MCG/ACT inhaler Inhale 2 puffs into the lungs every 6 (six) hours as needed for wheezing or shortness of breath.   Past Week  . benzonatate (TESSALON) 100 MG capsule Take 1 capsule (100 mg total) by mouth 3 (three) times daily as needed for cough. 30 capsule 0 07/15/2015  . budesonide-formoterol (SYMBICORT) 160-4.5 MCG/ACT inhaler Inhale 2 puffs into the lungs 2 (two) times daily.   07/14/2015  . Dextromethorphan-Guaifenesin 20-400 MG TABS Take 1 tablet by mouth every 6 (six) hours as needed (mucus).   Past Week  . diltiazem (CARDIZEM CD) 120 MG 24 hr capsule Take 1 capsule (120 mg total) by mouth daily. 30 capsule 11 07/15/2015  . fluticasone (FLONASE) 50 MCG/ACT nasal spray Place 2 sprays into both nostrils daily.   07/14/2015  . LORazepam (ATIVAN) 0.5 MG tablet Take 1 tablet (0.5 mg total) by mouth 2 (two) times  daily as needed for anxiety or sleep. 20 tablet 0 07/15/2015  . metFORMIN (GLUCOPHAGE) 500 MG tablet Take 1 tablet (500 mg total) by mouth daily with breakfast. 30 tablet 3 07/15/2015  . montelukast (SINGULAIR) 10 MG tablet Take 10 mg by mouth at bedtime.   07/14/2015  . oxyCODONE-acetaminophen (PERCOCET) 10-325 MG tablet Take 1 tablet by mouth every 4 (four) hours as needed for pain.   07/14/2015  . oxyCODONE-acetaminophen (ROXICET) 5-325 MG per tablet Take 1 tablet by mouth every 4 (four) hours as needed for severe pain. 25 tablet 0 07/14/2015  . pantoprazole (PROTONIX) 40 MG tablet Take 1 tablet (40 mg total) by mouth 2 (two) times daily. 60 tablet 0 07/15/2015  . promethazine-dextromethorphan (PROMETHAZINE-DM) 6.25-15 MG/5ML syrup Take 5 mLs by mouth 4 (four) times daily as needed for cough.   07/14/2015  . SPIRIVA RESPIMAT 2.5 MCG/ACT AERS Inhale 1 puff into the lungs daily.   07/15/2015  . baclofen (LIORESAL) 10 MG tablet Take 10 mg by mouth 3 (three) times daily as needed.   Unknown  . edoxaban (SAVAYSA) 60 MG TABS tablet Take 60 mg by mouth daily. 30 tablet 11 Couple Weeks  . levalbuterol (XOPENEX) 0.63 MG/3ML nebulizer solution Take 3 mLs (0.63 mg total) by nebulization 3 (three) times daily. (Patient taking differently: Take 0.63 mg by nebulization every 8 (eight) hours as needed for wheezing or shortness of breath. ) 3 mL 12 07/12/2015  . nicotine (NICODERM CQ - DOSED IN MG/24  HOURS) 21 mg/24hr patch Place 1 patch (21 mg total) onto the skin daily. 28 patch 0 Taking  . nitroGLYCERIN (NITROSTAT) 0.4 MG SL tablet Place 1 tablet (0.4 mg total) under the tongue every 5 (five) minutes as needed for chest pain. 25 tablet 4 Never  . predniSONE (DELTASONE) 20 MG tablet Take 2 tablets (40 mg total) by mouth daily. 10 tablet 0    Scheduled: . diltiazem  120 mg Oral Daily  . edoxaban  60 mg Oral Daily  . furosemide  80 mg Intravenous Q12H  . insulin aspart  0-20 Units Subcutaneous TID WC  . insulin  aspart  0-5 Units Subcutaneous QHS  . insulin glargine  30 Units Subcutaneous QHS  . ipratropium  0.5 mg Nebulization Q6H  . levalbuterol  0.63 mg Nebulization Q6H  . metFORMIN  500 mg Oral Q breakfast  . nicotine  14 mg Transdermal Daily  . oxyCODONE-acetaminophen  1 tablet Oral Q4H  . pantoprazole (PROTONIX) IV  40 mg Intravenous Daily  . predniSONE  40 mg Oral Q breakfast  . sodium chloride  3 mL Intravenous Q12H  . sodium chloride  3 mL Intravenous Q12H  . vancomycin  1,000 mg Intravenous Q8H   Continuous:  DUK:RCVKFM chloride, baclofen, fentaNYL (SUBLIMAZE) injection, LORazepam, nitroGLYCERIN, [DISCONTINUED] ondansetron **OR** ondansetron (ZOFRAN) IV, oxyCODONE-acetaminophen **AND** oxyCODONE, sodium chloride  Assesment: He was admitted with COPD exacerbation. He had acute on chronic respiratory failure. He was improving from that and approaching discharge when he had sudden worsening and ended up having to be placed on mechanical ventilation. I think he probably aspirated. He had lobar pneumonia from that with hospital-acquired pneumonia. His blood cultures are positive for MRSA. Remote consultation by infectious disease is underway. At baseline his COPD is pretty severe. He is still requiring high flow oxygen but that is being tapered  He has diabetes and at home he's been able to control it with 500 mg of metformin and following his diet but I told him that with him being as sick as he has that may not work and he understands. He does feel like he can give himself shots at home.  He has atrial fibrillation but currently seems to be in sinus rhythm. He is chronically anticoagulated.  He has chronic pain and follows at a pain clinic. He request that he have his pain medication on a scheduled basis which I think is okay now that he is so much more alert   Principal Problem:   MRSA bacteremia Active Problems:   Hyperlipidemia   Type 2 diabetes mellitus without complication ([place])    Cavitary lesion of lung   Decompensated COPD with exacerbation (chronic obstructive pulmonary disease) (HCC)   Acute on chronic respiratory failure (HCC)   COPD exacerbation (HCC)   Sepsis ([place])   Lobar pneumonia (HCC)   HAP (hospital-acquired pneumonia)    Plan: Depending on how long he needs IV antibiotics he will either need to have cathflo for his PICC line to open it up or if that's not successful a new PICC line. Will discuss with infectious disease consultants today   LOS: 16 days   HAWKINS,EDWARD L 07/31/2015, 8:19 AM

## 2015-07-31 NOTE — Evaluation (Signed)
Physical Therapy Evaluation Patient Details Name: Ryan [name] MRN: [id number] DOB: 1961/04/20 Today's Date: 07/31/2015   History of Present Illness  Pt is a 55 year old male admitted with an exacerbation of COPD on 07-15-15.  He was treated and improved but then had a relapse and had to be transferred to ICU where he had to be intubated and on the ventilator for some time.  He is now out of ICU and hoping to go home tomorrow.[name]  He is referred to PT for a reassessment.  Clinical Impression   Pt was seen for re-evaluation.  He was sitting at edge of bed on 6 L O2.  He reported feeling well.  Strength was WNL and he is independent in transfers.  He has been walking to and from the bathroom on his own.  We discussed energy conservation and he was given a walker to aide in this respect.  He was able to ambulate 180' using the walker and minimal dyspnea, good stability.  He should definitely be able to transition to home at d/c.  He feels that he can progress his activity level on his own and declines any HHPT.    Follow Up Recommendations No PT follow up (pt declines any HHPT)    Equipment Recommendations  None recommended by PT    Recommendations for Other Services   none    Precautions / Restrictions Precautions Precautions: None Restrictions Weight Bearing Restrictions: No      Mobility  Bed Mobility Overal bed mobility: Modified Independent                Transfers Overall transfer level: Modified independent Equipment used: None Transfers: Sit to/from Stand Sit to Stand: Modified independent (Device/Increase time)            Ambulation/Gait Ambulation/Gait assistance: Modified independent (Device/Increase time) Ambulation Distance (Feet): 180 Feet Assistive device: Rolling walker (2 wheeled) Gait Pattern/deviations: WFL(Within Functional Limits);Narrow base of support   Gait velocity interpretation: <1.8 ft/sec, indicative of risk for recurrent falls     Stairs            Wheelchair Mobility    Modified Rankin (Stroke Patients Only)       Balance Overall balance assessment: Modified Independent   Sitting balance-Leahy Scale: Normal       Standing balance-Leahy Scale: Good                               Pertinent Vitals/Pain Pain Assessment: No/denies pain    Home Living Family/patient expects to be discharged to:: Private residence Living Arrangements: Spouse/significant other Available Help at Discharge: Family;Available 24 hours/day Type of Home: Mobile home Home Access: Stairs to enter Entrance Stairs-Rails: Right;Left Entrance Stairs-Number of Steps: 5 Home Layout: One level Home Equipment: Cane - single point;Walker - 2 wheels      Prior Function Level of Independence: Independent               Hand Dominance   Dominant Hand: Left    Extremity/Trunk Assessment   Upper Extremity Assessment: Overall WFL for tasks assessed           Lower Extremity Assessment: Overall WFL for tasks assessed      Cervical / Trunk Assessment: Kyphotic  Communication   Communication: No difficulties  Cognition Arousal/Alertness: Awake/alert Behavior During Therapy: WFL for tasks assessed/performed Overall Cognitive Status: Within Functional Limits for tasks assessed  General Comments      Exercises        Assessment/Plan    PT Assessment Patent does not need any further PT services  PT Diagnosis     PT Problem List    PT Treatment Interventions     PT Goals (Current goals can be found in the Care Plan section) Acute Rehab PT Goals PT Goal Formulation: All assessment and education complete, DC therapy    Frequency     Barriers to discharge   none    Co-evaluation               End of Session Equipment Utilized During Treatment: Gait belt;Oxygen Activity Tolerance: Patient tolerated treatment well Patient left: in bed;with call  bell/phone within reach Nurse Communication: Mobility status         Time: 9093-1121 PT Time Calculation (min) (ACUTE ONLY): 32 min   Charges:   PT Evaluation $PT Re-evaluation: 1 Procedure     PT G CodesDemetrios Isaacs L  PT 07/31/2015, 3:09 PM [phone]

## 2015-08-01 LAB — BASIC METABOLIC PANEL
Anion gap: 7 (ref 5–15)
BUN: 29 mg/dL — AB (ref 6–20)
CALCIUM: 7.8 mg/dL — AB (ref 8.9–10.3)
CO2: 40 mmol/L — ABNORMAL HIGH (ref 22–32)
CREATININE: 0.77 mg/dL (ref 0.61–1.24)
Chloride: 92 mmol/L — ABNORMAL LOW (ref 101–111)
GFR calc Af Amer: >60 mL/min (ref >60)
GLUCOSE: 156 mg/dL — AB (ref 65–99)
POTASSIUM: 2.7 mmol/L — AB (ref 3.5–5.1)
SODIUM: 139 mmol/L (ref 135–145)

## 2015-08-01 LAB — GLUCOSE, CAPILLARY
Glucose-Capillary: 179 mg/dL — ABNORMAL HIGH (ref 65–99)
Glucose-Capillary: 360 mg/dL — ABNORMAL HIGH (ref 65–99)

## 2015-08-01 MED ORDER — HEPARIN SOD (PORK) LOCK FLUSH 100 UNIT/ML IV SOLN: 250.0000 [IU] | INTRAVENOUS | Status: DC | PRN

## 2015-08-01 MED ORDER — INSULIN ASPART 100 UNIT/ML ~~LOC~~ SOLN: 0.0000 [IU] | Freq: Three times a day (TID) | SUBCUTANEOUS | Status: DC

## 2015-08-01 MED ORDER — POTASSIUM CHLORIDE CRYS ER 20 MEQ PO TBCR
40.0000 meq | EXTENDED_RELEASE_TABLET | Freq: Once | ORAL | Status: AC
  Administered 2015-08-01: 40 meq via ORAL
  Filled 2015-08-01: qty 2

## 2015-08-01 MED ORDER — INSULIN ASPART 100 UNIT/ML ~~LOC~~ SOLN: 0.0000 [IU] | Freq: Every day | SUBCUTANEOUS | Status: DC

## 2015-08-01 MED ORDER — PREDNISONE 10 MG PO TABS: 40.0000 mg | ORAL_TABLET | Freq: Every day | ORAL | Status: DC

## 2015-08-01 NOTE — Care Management Note (Signed)
Case Management Note  Patient Details  Name: Ryan [name] MRN: [id number] Date of Birth: [birth year]   Expected Discharge Date:                  Expected Discharge Plan:  [place]  In-House Referral:  NA  Discharge planning Services  CM Consult  Post Acute Care Choice:  Home Health, Durable Medical Equipment Choice offered to:  Patient  DME Arranged:  Oxygen DME Agency:     HH Arranged:  RN, IV Antibiotics HH Agency:  [place]  Status of Service:  Completed, signed off  Medicare Important Message Given:  Yes Date Medicare IM Given:    Medicare IM give by:    Date Additional Medicare IM Given:    Additional Medicare Important Message give by:     If discussed at [place] of Stay Meetings, dates discussed:  07/31/2015  Additional Comments: Pt discharging home today with [facility] services through [facility]. Pt will receive IV abx. Pt will also need new O2 equiptment that can provide high flow O2. [facility] is aware of DC plan and needs and will obtain pt info from chart. [facility] will be able to provide a RN this evening in the home to start IV abx. Pt will have port O2 tanks with HF regulator delivered to room prior to DC. No further CM needs at this time Sherald Barge, RN 08/01/2015, 1:16 PM

## 2015-08-01 NOTE — Progress Notes (Signed)
He feels well and wants to go home. He was able to ambulate yesterday. He will need high flow oxygen concentrator I think he is ready for discharge. Please see discharge summary for details he is hypokalemic this morning and that will be replaced

## 2015-08-01 NOTE — Progress Notes (Signed)
Patient states understanding of discharge instructions, midline flushed prior to discharge.

## 2015-08-01 NOTE — Care Management Important Message (Signed)
Important Message  Patient Details  Name: Ryan [name] MRN: [id number] Date of Birth: 1960/08/11   Medicare Important Message Given:  Yes    Sherald Barge, RN 08/01/2015, 1:18 PM

## 2015-08-01 NOTE — Discharge Summary (Signed)
Physician Discharge Summary  Patient ID: Ryan [name] MRN: [id number] DOB/AGE: [birth year] 57 y.o. Primary Care Physician:Florencia Zaccaro L, MD Admit date: 07/15/2015 Discharge date: 08/01/2015    Discharge Diagnoses:   Principal Problem:   MRSA bacteremia Active Problems:   Hyperlipidemia   Type 2 diabetes mellitus without complication ([place])   Cavitary lesion of lung   Decompensated COPD with exacerbation (chronic obstructive pulmonary disease) (HCC)   Acute on chronic respiratory failure (HCC)   COPD exacerbation (HCC)   Sepsis (HCC)   Lobar pneumonia (HCC)   HAP (hospital-acquired pneumonia)     Medication List    TAKE these medications        baclofen 10 MG tablet  Commonly known as:  LIORESAL  Take 10 mg by mouth 3 (three) times daily as needed.     benzonatate 100 MG capsule  Commonly known as:  TESSALON  Take 1 capsule (100 mg total) by mouth 3 (three) times daily as needed for cough.     budesonide-formoterol 160-4.5 MCG/ACT inhaler  Commonly known as:  SYMBICORT  Inhale 2 puffs into the lungs 2 (two) times daily.     Dextromethorphan-Guaifenesin 20-400 MG Tabs  Take 1 tablet by mouth every 6 (six) hours as needed (mucus).     diltiazem 120 MG 24 hr capsule  Commonly known as:  CARDIZEM CD  Take 1 capsule (120 mg total) by mouth daily.     edoxaban 60 MG Tabs tablet  Commonly known as:  SAVAYSA  Take 60 mg by mouth daily.     fluticasone 50 MCG/ACT nasal spray  Commonly known as:  FLONASE  Place 2 sprays into both nostrils daily.     insulin aspart 100 UNIT/ML injection  Commonly known as:  novoLOG  Inject 0-20 Units into the skin 3 (three) times daily with meals.     insulin aspart 100 UNIT/ML injection  Commonly known as:  novoLOG  Inject 0-5 Units into the skin at bedtime.     levalbuterol 0.63 MG/3ML nebulizer solution  Commonly known as:  XOPENEX  Take 3 mLs (0.63 mg total) by nebulization 3 (three) times daily.     LORazepam 0.5 MG tablet   Commonly known as:  ATIVAN  Take 1 tablet (0.5 mg total) by mouth 2 (two) times daily as needed for anxiety or sleep.     metFORMIN 500 MG tablet  Commonly known as:  GLUCOPHAGE  Take 1 tablet (500 mg total) by mouth daily with breakfast.     montelukast 10 MG tablet  Commonly known as:  SINGULAIR  Take 10 mg by mouth at bedtime.     nicotine 21 mg/24hr patch  Commonly known as:  NICODERM CQ - dosed in mg/24 hours  Place 1 patch (21 mg total) onto the skin daily.     nitroGLYCERIN 0.4 MG SL tablet  Commonly known as:  NITROSTAT  Place 1 tablet (0.4 mg total) under the tongue every 5 (five) minutes as needed for chest pain.     oxyCODONE-acetaminophen 10-325 MG tablet  Commonly known as:  PERCOCET  Take 1 tablet by mouth every 4 (four) hours as needed for pain.     pantoprazole 40 MG tablet  Commonly known as:  PROTONIX  Take 1 tablet (40 mg total) by mouth 2 (two) times daily.     predniSONE 10 MG tablet  Commonly known as:  DELTASONE  Take 4 tablets (40 mg total) by mouth daily with breakfast.     promethazine-dextromethorphan  6.25-15 MG/5ML syrup  Commonly known as:  PROMETHAZINE-DM  Take 5 mLs by mouth 4 (four) times daily as needed for cough.     SPIRIVA RESPIMAT 2.5 MCG/ACT Aers  Generic drug:  Tiotropium Bromide Monohydrate  Inhale 1 puff into the lungs daily.     VENTOLIN HFA 108 (90 Base) MCG/ACT inhaler  Generic drug:  albuterol  Inhale 2 puffs into the lungs every 6 (six) hours as needed for wheezing or shortness of breath.        Discharged Condition: Improved    Consults: Remote with infectious disease  Significant Diagnostic Studies: Dg Chest 2 View  07/15/2015  CLINICAL DATA:  SOB, WEAK, Pt on O2 at night, pt has increased sob, cough with thick brown, clear sputum, PATIENT STATES " HE IS ALSO HAVING REALLY BAD LEG SPASMS LATELY" HISTORY OF DIABETES, PNEUMONIA, ATRIAL FIB, SEIZURES, SMOKER, CAD, CHRONIC RESPIRATORY FAILURE EXAM: CHEST  2 VIEW  COMPARISON:  Chest radiograph 07/01/2015 FINDINGS: Multiple monitoring leads overlie the patient. Stable cardiac and mediastinal contours. Re- demonstrated coarse bilateral interstitial pulmonary opacities involving the mid and lower lungs. Apical emphysematous change. Re- demonstrated peripheral consolidative opacity within the left upper lung. No pleural effusion pneumothorax. IMPRESSION: Findings most compatible with interstitial lung disease within the bilateral mid and lower lungs. Re- demonstrated cavitary lesion within the left upper lobe. Emphysema. Electronically Signed   By: Lovey Newcomer M.D.   On: 07/15/2015 17:51   Portable Chest Xray  07/26/2015  CLINICAL DATA:  Intubation. EXAM: PORTABLE CHEST 1 VIEW COMPARISON:  07/25/2015. FINDINGS: Endotracheal tube and NG tube in stable position. Mediastinum and hilar structures normal. Heart size normal. COPD . Improving infiltrate right lower lobe. Persistent small pleural effusion. No pneumothorax. IMPRESSION: 1. Lines and tubes in stable position . 2. Interim slight improvement of right lower lobe infiltrate. Persistent small right pleural effusion. Electronically Signed   By: Marcello Moores  Register   On: 07/26/2015 07:50   Dg Chest Port 1 View  07/25/2015  CLINICAL DATA:  Intubated patient, shortness of breath EXAM: PORTABLE CHEST 1 VIEW COMPARISON:  Chest x-ray of July 15, 2015, and chest CT scan dated May 01, 2015. FINDINGS: The lungs are well-expanded. There is confluent interstitial infiltrate in the right lower lobe. The right hemidiaphragm is now obscured. There is patchy interstitial density at the left lung base which is stable. The heart is normal in size. The pulmonary vascularity is prominent centrally but there is no pulmonary vascular congestion. The endotracheal tube tip lies approximately 5.7 cm above the carina. IMPRESSION: Bullous emphysema. Worsening of interstitial infiltrate in the right lower lung likely reflecting pneumonia. Stable  interstitial density at the left lung base. The endotracheal tube is in reasonable position. Electronically Signed   By: Ziair  Martinique M.D.   On: 07/25/2015 08:16    Lab Results: Basic Metabolic Panel:  Recent Labs  07/30/15 0515 08/01/15 0718  NA 137 139  K 2.9* 2.7*  CL 88* 92*  CO2 42* 40*  GLUCOSE 308* 156*  BUN 36* 29*  CREATININE 0.85 0.77  CALCIUM 7.7* 7.8*   Liver Function Tests: No results for input(s): AST, ALT, ALKPHOS, BILITOT, PROT, ALBUMIN in the last 72 hours.   CBC:  Recent Labs  07/30/15 0515  WBC 12.4*  HGB 8.6*  HCT 26.7*  MCV 74.2*  PLT 156    Recent Results (from the past 240 hour(s))  MRSA PCR Screening     Status: Abnormal   Collection Time: 07/25/15  7:40  AM  Result Value Ref Range Status   MRSA by PCR POSITIVE (A) NEGATIVE Final    Comment: RESULT CALLED TO, READ BACK BY AND VERIFIED WITH: SCHENOWITZ,L AT 1023 BY HUFFINES,S ON 07/25/15.        The GeneXpert MRSA Assay (FDA approved for NASAL specimens only), is one component of a comprehensive MRSA colonization surveillance program. It is not intended to diagnose MRSA infection nor to guide or monitor treatment for MRSA infections.   Culture, blood (Routine X 2) w Reflex to ID Panel     Status: None   Collection Time: 07/25/15  8:30 AM  Result Value Ref Range Status   Specimen Description BLOOD LEFT ARM  Final   Special Requests BOTTLES DRAWN AEROBIC AND ANAEROBIC 10 CC EACH  Final   Culture  Setup Time   Final    GRAM POSITIVE COCCI IN CLUSTERS Gram Stain Report Called to,Read Back By and Verified With: HEARNS J AT 0425 ON [id number] BY FORSYTH K AEROBIC BOTTLE ONLY    Culture   Final    STAPHYLOCOCCUS AUREUS SUSCEPTIBILITIES PERFORMED ON PREVIOUS CULTURE WITHIN THE LAST 5 DAYS. Performed at [facility]    Report Status 07/28/2015 FINAL  Final  Culture, blood (Routine X 2) w Reflex to ID Panel     Status: None   Collection Time: 07/25/15  8:35 AM  Result Value Ref  Range Status   Specimen Description BLOOD LEFT HAND  Final   Special Requests BOTTLES DRAWN AEROBIC AND ANAEROBIC 10 CC EACH  Final   Culture  Setup Time   Final    GRAM POSITIVE COCCI IN CLUSTERS Gram Stain Report Called to,Read Back By and Verified With: HEARN J AT 0425 ON [id number] BY FORSYTH K IN BOTH AEROBIC AND ANAEROBIC BOTTLES    Culture   Final    METHICILLIN RESISTANT STAPHYLOCOCCUS AUREUS Performed at [facility]    Report Status 07/28/2015 FINAL  Final   Organism ID, Bacteria METHICILLIN RESISTANT STAPHYLOCOCCUS AUREUS  Final      Susceptibility   Methicillin resistant staphylococcus aureus - MIC*    CIPROFLOXACIN >=8 RESISTANT Resistant     ERYTHROMYCIN >=8 RESISTANT Resistant     GENTAMICIN <=0.5 SENSITIVE Sensitive     OXACILLIN >=4 RESISTANT Resistant     TETRACYCLINE <=1 SENSITIVE Sensitive     VANCOMYCIN 1 SENSITIVE Sensitive     TRIMETH/SULFA <=10 SENSITIVE Sensitive     CLINDAMYCIN <=0.25 SENSITIVE Sensitive     RIFAMPIN <=0.5 SENSITIVE Sensitive     Inducible Clindamycin NEGATIVE Sensitive     * METHICILLIN RESISTANT STAPHYLOCOCCUS AUREUS  Culture, blood (Routine X 2) w Reflex to ID Panel     Status: None (Preliminary result)   Collection Time: 07/28/15  4:57 AM  Result Value Ref Range Status   Specimen Description BLOOD RIGHT ARM  Final   Special Requests BOTTLES DRAWN AEROBIC AND ANAEROBIC 6CC  Final   Culture NO GROWTH 3 DAYS  Final   Report Status PENDING  Incomplete  Culture, blood (Routine X 2) w Reflex to ID Panel     Status: None (Preliminary result)   Collection Time: 07/28/15  5:05 AM  Result Value Ref Range Status   Specimen Description BLOOD RIGHT HAND  Final   Special Requests BOTTLES DRAWN AEROBIC AND ANAEROBIC 4CC  Final   Culture NO GROWTH 3 DAYS  Final   Report Status PENDING  Incomplete     Hospital Course: This is a 55 year old came to  the hospital with COPD exacerbation. He was treated for that and was improving and actually was  ready for discharge the next morning when he became acutely much worse requiring intubation and mechanical ventilation. He was found to have pneumonia. It was felt that this was likely related to aspiration. He grew MRSA in his blood. He was treated for that was able to come off the ventilator and did well. However he does need high flow oxygen concentrator and he will need vancomycin for a total of 28 days intravenously which will be done through home health services. He is at maximum hospital benefit at the time of discharge  Discharge Exam: Blood pressure 122/67, pulse 74, temperature 98.5 F (36.9 C), temperature source Oral, resp. rate 20, height '5\' 8"'$  (1.727 m), weight 63.594 kg (140 lb 3.2 oz), SpO2 93 %. He is awake and alert. He is still coughing. He looks more comfortable.  Disposition: Home with home health services      Discharge Instructions    Discharge patient    Complete by:  As directed      Face-to-face encounter (required for Medicare/Medicaid patients)    Complete by:  As directed   I Aubreigh Fuerte L certify that this patient is under my care and that I, or a nurse practitioner or physician's assistant working with me, had a face-to-face encounter that meets the physician face-to-face encounter requirements with this patient on 08/01/2015. The encounter with the patient was in whole, or in part for the following medical condition(s) which is the primary reason for home health care (List medical condition): Staphylococcal sepsis  The encounter with the patient was in whole, or in part, for the following medical condition, which is the primary reason for home health care:  Staphylococcal sepsis  I certify that, based on my findings, the following services are medically necessary home health services:  Nursing  Reason for Medically Necessary Home Health Services:  Skilled Nursing- Change/Decline in Patient Status  My clinical findings support the need for the above services:   Shortness of breath with activity  Further, I certify that my clinical findings support that this patient is homebound due to:  Shortness of Breath with activity     Home Health    Complete by:  As directed   To provide the following care/treatments:  RN  1,250 milligrams of vancomycin IV every 12 hours with adjustments per pharmacy. This will be for a total of 28 days with the 28 days starting on 07/26/2015. He will need PICC line care.             Signed: Arsal Tappan L   08/01/2015, 9:48 AM

## 2015-08-02 LAB — CULTURE, BLOOD (ROUTINE X 2)
CULTURE: NO GROWTH
CULTURE: NO GROWTH

## 2015-08-03 LAB — GLUCOSE, CAPILLARY: Glucose-Capillary: 397 mg/dL — ABNORMAL HIGH (ref 65–99)

## 2015-08-07 ENCOUNTER — Telehealth: Payer: Self-pay | Admitting: *Deleted

## 2015-08-07 NOTE — Telephone Encounter (Signed)
Spoke with Zelda from [employer] [place] [phone] about Mr.Entwistle's application. Patient has been approved as of 08/07/15 for one year. He may request another refill on 10/05/15.

## 2015-08-08 ENCOUNTER — Other Ambulatory Visit: Payer: Self-pay | Admitting: Physician Assistant

## 2015-08-10 ENCOUNTER — Other Ambulatory Visit: Payer: Self-pay | Admitting: Physician Assistant

## 2015-08-10 NOTE — Telephone Encounter (Signed)
Refill refused.   Patient is not under [facility] MD.

## 2015-08-17 ENCOUNTER — Inpatient Hospital Stay (HOSPITAL_COMMUNITY)
Admission: EM | Admit: 2015-08-17 | Discharge: 2015-08-25 | DRG: 190 | Disposition: A | Discharge status: Home / Self Care | Payer: Medicare Other | Attending: Pulmonary Disease | Admitting: Pulmonary Disease

## 2015-08-17 ENCOUNTER — Emergency Department (HOSPITAL_COMMUNITY): Payer: Medicare Other

## 2015-08-17 ENCOUNTER — Encounter (HOSPITAL_COMMUNITY): Payer: Self-pay | Admitting: Emergency Medicine

## 2015-08-17 DIAGNOSIS — Z8701 Personal history of pneumonia (recurrent): Secondary | ICD-10-CM | POA: Diagnosis not present

## 2015-08-17 DIAGNOSIS — K760 Fatty (change of) liver, not elsewhere classified: Secondary | ICD-10-CM | POA: Diagnosis present

## 2015-08-17 DIAGNOSIS — D638 Anemia in other chronic diseases classified elsewhere: Secondary | ICD-10-CM | POA: Diagnosis not present

## 2015-08-17 DIAGNOSIS — J9621 Acute and chronic respiratory failure with hypoxia: Secondary | ICD-10-CM | POA: Diagnosis present

## 2015-08-17 DIAGNOSIS — E43 Unspecified severe protein-calorie malnutrition: Secondary | ICD-10-CM | POA: Diagnosis present

## 2015-08-17 DIAGNOSIS — R748 Abnormal levels of other serum enzymes: Secondary | ICD-10-CM

## 2015-08-17 DIAGNOSIS — I482 Chronic atrial fibrillation: Secondary | ICD-10-CM | POA: Diagnosis present

## 2015-08-17 DIAGNOSIS — Z87891 Personal history of nicotine dependence: Secondary | ICD-10-CM | POA: Diagnosis not present

## 2015-08-17 DIAGNOSIS — E785 Hyperlipidemia, unspecified: Secondary | ICD-10-CM | POA: Diagnosis present

## 2015-08-17 DIAGNOSIS — J440 Chronic obstructive pulmonary disease with acute lower respiratory infection: Principal | ICD-10-CM | POA: Diagnosis present

## 2015-08-17 DIAGNOSIS — Z794 Long term (current) use of insulin: Secondary | ICD-10-CM

## 2015-08-17 DIAGNOSIS — R0602 Shortness of breath: Secondary | ICD-10-CM | POA: Diagnosis present

## 2015-08-17 DIAGNOSIS — J8410 Pulmonary fibrosis, unspecified: Secondary | ICD-10-CM | POA: Diagnosis present

## 2015-08-17 DIAGNOSIS — K219 Gastro-esophageal reflux disease without esophagitis: Secondary | ICD-10-CM | POA: Diagnosis present

## 2015-08-17 DIAGNOSIS — Z7951 Long term (current) use of inhaled steroids: Secondary | ICD-10-CM

## 2015-08-17 DIAGNOSIS — Z6821 Body mass index (BMI) 21.0-21.9, adult: Secondary | ICD-10-CM

## 2015-08-17 DIAGNOSIS — J9611 Chronic respiratory failure with hypoxia: Secondary | ICD-10-CM | POA: Diagnosis not present

## 2015-08-17 DIAGNOSIS — Z8614 Personal history of Methicillin resistant Staphylococcus aureus infection: Secondary | ICD-10-CM

## 2015-08-17 DIAGNOSIS — Z8249 Family history of ischemic heart disease and other diseases of the circulatory system: Secondary | ICD-10-CM | POA: Diagnosis not present

## 2015-08-17 DIAGNOSIS — T380X5A Adverse effect of glucocorticoids and synthetic analogues, initial encounter: Secondary | ICD-10-CM | POA: Diagnosis present

## 2015-08-17 DIAGNOSIS — R7881 Bacteremia: Secondary | ICD-10-CM | POA: Diagnosis present

## 2015-08-17 DIAGNOSIS — R600 Localized edema: Secondary | ICD-10-CM | POA: Diagnosis not present

## 2015-08-17 DIAGNOSIS — J449 Chronic obstructive pulmonary disease, unspecified: Secondary | ICD-10-CM | POA: Diagnosis present

## 2015-08-17 DIAGNOSIS — E7800 Pure hypercholesterolemia, unspecified: Secondary | ICD-10-CM | POA: Diagnosis present

## 2015-08-17 DIAGNOSIS — R609 Edema, unspecified: Secondary | ICD-10-CM | POA: Insufficient documentation

## 2015-08-17 DIAGNOSIS — Z7901 Long term (current) use of anticoagulants: Secondary | ICD-10-CM

## 2015-08-17 DIAGNOSIS — N179 Acute kidney failure, unspecified: Secondary | ICD-10-CM | POA: Diagnosis present

## 2015-08-17 DIAGNOSIS — D509 Iron deficiency anemia, unspecified: Secondary | ICD-10-CM | POA: Diagnosis present

## 2015-08-17 DIAGNOSIS — I4891 Unspecified atrial fibrillation: Secondary | ICD-10-CM | POA: Diagnosis present

## 2015-08-17 DIAGNOSIS — M79671 Pain in right foot: Secondary | ICD-10-CM

## 2015-08-17 DIAGNOSIS — J189 Pneumonia, unspecified organism: Secondary | ICD-10-CM | POA: Diagnosis present

## 2015-08-17 DIAGNOSIS — J441 Chronic obstructive pulmonary disease with (acute) exacerbation: Secondary | ICD-10-CM | POA: Diagnosis present

## 2015-08-17 DIAGNOSIS — Z79899 Other long term (current) drug therapy: Secondary | ICD-10-CM | POA: Diagnosis not present

## 2015-08-17 DIAGNOSIS — M79672 Pain in left foot: Secondary | ICD-10-CM

## 2015-08-17 DIAGNOSIS — I2510 Atherosclerotic heart disease of native coronary artery without angina pectoris: Secondary | ICD-10-CM | POA: Diagnosis present

## 2015-08-17 DIAGNOSIS — B9562 Methicillin resistant Staphylococcus aureus infection as the cause of diseases classified elsewhere: Secondary | ICD-10-CM | POA: Diagnosis present

## 2015-08-17 DIAGNOSIS — J9601 Acute respiratory failure with hypoxia: Secondary | ICD-10-CM | POA: Diagnosis not present

## 2015-08-17 DIAGNOSIS — E119 Type 2 diabetes mellitus without complications: Secondary | ICD-10-CM | POA: Diagnosis present

## 2015-08-17 DIAGNOSIS — D508 Other iron deficiency anemias: Secondary | ICD-10-CM | POA: Diagnosis not present

## 2015-08-17 DIAGNOSIS — Z79891 Long term (current) use of opiate analgesic: Secondary | ICD-10-CM

## 2015-08-17 DIAGNOSIS — Z833 Family history of diabetes mellitus: Secondary | ICD-10-CM | POA: Diagnosis not present

## 2015-08-17 DIAGNOSIS — I481 Persistent atrial fibrillation: Secondary | ICD-10-CM | POA: Diagnosis not present

## 2015-08-17 DIAGNOSIS — E876 Hypokalemia: Secondary | ICD-10-CM | POA: Diagnosis present

## 2015-08-17 DIAGNOSIS — D649 Anemia, unspecified: Secondary | ICD-10-CM | POA: Diagnosis not present

## 2015-08-17 HISTORY — DX: Barrett's esophagus without dysplasia: K22.70

## 2015-08-17 LAB — COMPREHENSIVE METABOLIC PANEL
ALBUMIN: 2.4 g/dL — AB (ref 3.5–5.0)
ALT: 287 U/L — AB (ref 17–63)
AST: 217 U/L — AB (ref 15–41)
Alkaline Phosphatase: 112 U/L (ref 38–126)
Anion gap: 8 (ref 5–15)
BUN: 13 mg/dL (ref 6–20)
CHLORIDE: 96 mmol/L — AB (ref 101–111)
CO2: 31 mmol/L (ref 22–32)
CREATININE: 1.23 mg/dL (ref 0.61–1.24)
Calcium: 7.7 mg/dL — ABNORMAL LOW (ref 8.9–10.3)
GFR calc Af Amer: >60 mL/min (ref >60)
GFR calc non Af Amer: >60 mL/min (ref >60)
GLUCOSE: 186 mg/dL — AB (ref 65–99)
POTASSIUM: 3.6 mmol/L (ref 3.5–5.1)
Sodium: 135 mmol/L (ref 135–145)
Total Bilirubin: 1 mg/dL (ref 0.3–1.2)
Total Protein: 6.3 g/dL — ABNORMAL LOW (ref 6.5–8.1)

## 2015-08-17 LAB — I-STAT CHEM 8, ED
BUN: 8 mg/dL (ref 6–20)
CALCIUM ION: 0.91 mmol/L — AB (ref 1.12–1.23)
Chloride: 98 mmol/L — ABNORMAL LOW (ref 101–111)
Creatinine, Ser: 0.9 mg/dL (ref 0.61–1.24)
GLUCOSE: 148 mg/dL — AB (ref 65–99)
HCT: 22 % — ABNORMAL LOW (ref 39.0–52.0)
HEMOGLOBIN: 7.5 g/dL — AB (ref 13.0–17.0)
POTASSIUM: 2.9 mmol/L — AB (ref 3.5–5.1)
Sodium: 137 mmol/L (ref 135–145)
TCO2: 23 mmol/L (ref 0–100)

## 2015-08-17 LAB — GLUCOSE, CAPILLARY
GLUCOSE-CAPILLARY: 301 mg/dL — AB (ref 65–99)
GLUCOSE-CAPILLARY: 348 mg/dL — AB (ref 65–99)

## 2015-08-17 LAB — CBC WITH DIFFERENTIAL/PLATELET
BASOS ABS: 0 10*3/uL (ref 0.0–0.1)
BASOS ABS: 0 10*3/uL (ref 0.0–0.1)
BASOS PCT: 0 %
Basophils Relative: 0 %
EOS PCT: 1 %
EOS PCT: 1 %
Eosinophils Absolute: 0 10*3/uL (ref 0.0–0.7)
Eosinophils Absolute: 0.1 10*3/uL (ref 0.0–0.7)
HCT: 18.6 % — ABNORMAL LOW (ref 39.0–52.0)
HEMATOCRIT: 25.8 % — AB (ref 39.0–52.0)
Hemoglobin: 5.7 g/dL — CL (ref 13.0–17.0)
Hemoglobin: 8.1 g/dL — ABNORMAL LOW (ref 13.0–17.0)
LYMPHS PCT: 17 %
LYMPHS PCT: 11 %
Lymphs Abs: 0.6 10*3/uL — ABNORMAL LOW (ref 0.7–4.0)
Lymphs Abs: 0.5 10*3/uL — ABNORMAL LOW (ref 0.7–4.0)
MCH: 23.5 pg — ABNORMAL LOW (ref 26.0–34.0)
MCH: 23.9 pg — ABNORMAL LOW (ref 26.0–34.0)
MCHC: 30.6 g/dL (ref 30.0–36.0)
MCHC: 31.4 g/dL (ref 30.0–36.0)
MCV: 76.5 fL — AB (ref 78.0–100.0)
MCV: 76.1 fL — AB (ref 78.0–100.0)
MONO ABS: 0.5 10*3/uL (ref 0.1–1.0)
Monocytes Absolute: 0.4 10*3/uL (ref 0.1–1.0)
Monocytes Relative: 12 %
Monocytes Relative: 11 %
NEUTROS ABS: 2.3 10*3/uL (ref 1.7–7.7)
NEUTROS ABS: 3.7 10*3/uL (ref 1.7–7.7)
Neutrophils Relative %: 69 %
Neutrophils Relative %: 77 %
PLATELETS: 105 10*3/uL — AB (ref 150–400)
PLATELETS: 152 10*3/uL (ref 150–400)
RBC: 2.43 MIL/uL — AB (ref 4.22–5.81)
RBC: 3.39 MIL/uL — AB (ref 4.22–5.81)
RDW: 21.4 % — ABNORMAL HIGH (ref 11.5–15.5)
RDW: 21.3 % — AB (ref 11.5–15.5)
WBC: 3.4 10*3/uL — AB (ref 4.0–10.5)
WBC: 4.8 10*3/uL (ref 4.0–10.5)

## 2015-08-17 LAB — I-STAT CG4 LACTIC ACID, ED: LACTIC ACID, VENOUS: 3.09 mmol/L — AB (ref 0.5–2.0)

## 2015-08-17 LAB — FERRITIN: Ferritin: 307 ng/mL (ref 24–336)

## 2015-08-17 LAB — I-STAT TROPONIN, ED: TROPONIN I, POC: 0 ng/mL (ref 0.00–0.08)

## 2015-08-17 LAB — RETICULOCYTES
RBC.: 3.4 MIL/uL — AB (ref 4.22–5.81)
RETIC COUNT ABSOLUTE: 85 10*3/uL (ref 19.0–186.0)
Retic Ct Pct: 2.5 % (ref 0.4–3.1)

## 2015-08-17 LAB — IRON AND TIBC
IRON: 42 ug/dL — AB (ref 45–182)
Saturation Ratios: 15 % — ABNORMAL LOW (ref 17.9–39.5)
TIBC: 272 ug/dL (ref 250–450)
UIBC: 230 ug/dL

## 2015-08-17 LAB — BRAIN NATRIURETIC PEPTIDE: B NATRIURETIC PEPTIDE 5: 33 pg/mL (ref 0.0–100.0)

## 2015-08-17 LAB — VITAMIN B12: Vitamin B-12: 779 pg/mL (ref 180–914)

## 2015-08-17 LAB — FOLATE: Folate: 28.1 ng/mL (ref >5.9)

## 2015-08-17 LAB — ABO/RH: ABO/RH(D): A POS

## 2015-08-17 LAB — POC OCCULT BLOOD, ED: Fecal Occult Bld: NEGATIVE

## 2015-08-17 MED ORDER — HEPARIN SODIUM (PORCINE) 5000 UNIT/ML IJ SOLN: 5000.0000 [IU] | Freq: Three times a day (TID) | INTRAMUSCULAR | Status: DC

## 2015-08-17 MED ORDER — IPRATROPIUM-ALBUTEROL 0.5-2.5 (3) MG/3ML IN SOLN: 3.0000 mL | Freq: Four times a day (QID) | RESPIRATORY_TRACT | Status: DC

## 2015-08-17 MED ORDER — OXYCODONE-ACETAMINOPHEN 5-325 MG PO TABS
1.0000 | ORAL_TABLET | ORAL | Status: DC | PRN
  Administered 2015-08-17 – 2015-08-19 (×8): 1 via ORAL
  Filled 2015-08-17 (×8): qty 1

## 2015-08-17 MED ORDER — ACETAMINOPHEN 650 MG RE SUPP: 650.0000 mg | Freq: Four times a day (QID) | RECTAL | Status: DC | PRN

## 2015-08-17 MED ORDER — BENZONATATE 100 MG PO CAPS
200.0000 mg | ORAL_CAPSULE | Freq: Three times a day (TID) | ORAL | Status: DC
  Administered 2015-08-17 – 2015-08-25 (×25): 200 mg via ORAL
  Filled 2015-08-17 (×26): qty 2

## 2015-08-17 MED ORDER — SODIUM CHLORIDE 0.9 % IV SOLN: Freq: Once | INTRAVENOUS | Status: DC

## 2015-08-17 MED ORDER — IPRATROPIUM-ALBUTEROL 0.5-2.5 (3) MG/3ML IN SOLN: 3.0000 mL | RESPIRATORY_TRACT | Status: DC | PRN

## 2015-08-17 MED ORDER — ACETAMINOPHEN 325 MG PO TABS: 650.0000 mg | ORAL_TABLET | Freq: Four times a day (QID) | ORAL | Status: DC | PRN

## 2015-08-17 MED ORDER — DILTIAZEM HCL ER COATED BEADS 120 MG PO CP24
120.0000 mg | ORAL_CAPSULE | Freq: Every day | ORAL | Status: DC
  Administered 2015-08-18 – 2015-08-25 (×8): 120 mg via ORAL
  Filled 2015-08-17 (×8): qty 1

## 2015-08-17 MED ORDER — POTASSIUM CHLORIDE 10 MEQ/100ML IV SOLN: 10.0000 meq | Freq: Once | INTRAVENOUS | Status: DC

## 2015-08-17 MED ORDER — LORAZEPAM 0.5 MG PO TABS
0.5000 mg | ORAL_TABLET | Freq: Two times a day (BID) | ORAL | Status: DC | PRN
Start: 2015-08-17 — End: 2015-08-19
  Administered 2015-08-17 – 2015-08-18 (×2): 0.5 mg via ORAL
  Filled 2015-08-17 (×3): qty 1

## 2015-08-17 MED ORDER — SODIUM CHLORIDE 0.9 % IV SOLN
500.0000 mg | Freq: Two times a day (BID) | INTRAVENOUS | Status: DC
  Filled 2015-08-17 (×6): qty 500

## 2015-08-17 MED ORDER — LEVALBUTEROL HCL 1.25 MG/0.5ML IN NEBU
1.2500 mg | INHALATION_SOLUTION | Freq: Once | RESPIRATORY_TRACT | Status: AC
  Administered 2015-08-17: 1.25 mg via RESPIRATORY_TRACT
  Filled 2015-08-17: qty 0.5

## 2015-08-17 MED ORDER — VANCOMYCIN HCL 10 G IV SOLR
1250.0000 mg | Freq: Once | INTRAVENOUS | Status: DC
  Filled 2015-08-17: qty 1250

## 2015-08-17 MED ORDER — INSULIN ASPART 100 UNIT/ML ~~LOC~~ SOLN: 0.0000 [IU] | Freq: Three times a day (TID) | SUBCUTANEOUS | Status: DC

## 2015-08-17 MED ORDER — DEXTROSE 5 % IV SOLN
1.0000 g | Freq: Three times a day (TID) | INTRAVENOUS | Status: AC
  Administered 2015-08-17 – 2015-08-25 (×24): 1 g via INTRAVENOUS
  Filled 2015-08-17 (×23): qty 1

## 2015-08-17 MED ORDER — LEVALBUTEROL HCL 0.63 MG/3ML IN NEBU
0.6300 mg | INHALATION_SOLUTION | Freq: Four times a day (QID) | RESPIRATORY_TRACT | Status: DC
  Administered 2015-08-17 – 2015-08-25 (×31): 0.63 mg via RESPIRATORY_TRACT
  Filled 2015-08-17 (×32): qty 3

## 2015-08-17 MED ORDER — OXYCODONE-ACETAMINOPHEN 5-325 MG PO TABS
2.0000 | ORAL_TABLET | Freq: Once | ORAL | Status: AC
  Administered 2015-08-17: 2 via ORAL
  Filled 2015-08-17: qty 2

## 2015-08-17 MED ORDER — OXYCODONE HCL 5 MG PO TABS
5.0000 mg | ORAL_TABLET | ORAL | Status: DC | PRN
  Administered 2015-08-17 – 2015-08-19 (×8): 5 mg via ORAL
  Filled 2015-08-17 (×8): qty 1

## 2015-08-17 MED ORDER — FUROSEMIDE 40 MG PO TABS
40.0000 mg | ORAL_TABLET | Freq: Two times a day (BID) | ORAL | Status: DC
  Administered 2015-08-17 – 2015-08-25 (×16): 40 mg via ORAL
  Filled 2015-08-17 (×16): qty 1

## 2015-08-17 MED ORDER — METHYLPREDNISOLONE SODIUM SUCC 125 MG IJ SOLR
60.0000 mg | Freq: Four times a day (QID) | INTRAMUSCULAR | Status: DC
  Administered 2015-08-17 – 2015-08-19 (×7): 60 mg via INTRAVENOUS
  Filled 2015-08-17 (×7): qty 2

## 2015-08-17 MED ORDER — BACLOFEN 10 MG PO TABS
10.0000 mg | ORAL_TABLET | Freq: Three times a day (TID) | ORAL | Status: DC
  Administered 2015-08-17 – 2015-08-25 (×25): 10 mg via ORAL
  Filled 2015-08-17 (×32): qty 1

## 2015-08-17 MED ORDER — LEVALBUTEROL HCL 0.63 MG/3ML IN NEBU
0.6300 mg | INHALATION_SOLUTION | RESPIRATORY_TRACT | Status: DC | PRN
Start: 2015-08-17 — End: 2015-08-25
  Administered 2015-08-20: 0.63 mg via RESPIRATORY_TRACT

## 2015-08-17 MED ORDER — VANCOMYCIN HCL 10 G IV SOLR
1250.0000 mg | Freq: Two times a day (BID) | INTRAVENOUS | Status: DC
  Administered 2015-08-17 – 2015-08-18 (×3): 1250 mg via INTRAVENOUS
  Filled 2015-08-17 (×6): qty 1250

## 2015-08-17 MED ORDER — INSULIN ASPART 100 UNIT/ML ~~LOC~~ SOLN
0.0000 [IU] | Freq: Three times a day (TID) | SUBCUTANEOUS | Status: DC
  Administered 2015-08-17: 11 [IU] via SUBCUTANEOUS

## 2015-08-17 MED ORDER — METFORMIN HCL 500 MG PO TABS
500.0000 mg | ORAL_TABLET | Freq: Every day | ORAL | Status: DC
  Administered 2015-08-18 – 2015-08-25 (×8): 500 mg via ORAL
  Filled 2015-08-17 (×8): qty 1

## 2015-08-17 MED ORDER — OXYCODONE-ACETAMINOPHEN 10-325 MG PO TABS: 1.0000 | ORAL_TABLET | ORAL | Status: DC | PRN

## 2015-08-17 MED ORDER — SODIUM CHLORIDE 0.9% FLUSH
3.0000 mL | Freq: Two times a day (BID) | INTRAVENOUS | Status: DC
  Administered 2015-08-17 – 2015-08-24 (×13): 3 mL via INTRAVENOUS

## 2015-08-17 MED ORDER — EDOXABAN TOSYLATE 60 MG PO TABS
60.0000 mg | ORAL_TABLET | Freq: Every day | ORAL | Status: DC
  Administered 2015-08-18 – 2015-08-20 (×3): 60 mg via ORAL
  Filled 2015-08-17 (×5): qty 60

## 2015-08-17 MED ORDER — IPRATROPIUM BROMIDE 0.02 % IN SOLN
0.5000 mg | Freq: Once | RESPIRATORY_TRACT | Status: AC
  Administered 2015-08-17: 0.5 mg via RESPIRATORY_TRACT
  Filled 2015-08-17: qty 2.5

## 2015-08-17 MED ORDER — POTASSIUM CHLORIDE CRYS ER 20 MEQ PO TBCR
40.0000 meq | EXTENDED_RELEASE_TABLET | Freq: Once | ORAL | Status: AC
  Administered 2015-08-17: 40 meq via ORAL
  Filled 2015-08-17: qty 2

## 2015-08-17 NOTE — ED Notes (Signed)
Pt clinical presentation improved, work of breathing decreased, SPO2 98% on Bipap 16/8 50%. Patient able to speak in complete sentences now. Patient states he feels like he's "breathing much better".

## 2015-08-17 NOTE — ED Notes (Signed)
Sob x 45 mins. BLE edema. H/s copd. O2 saturation 82% upon ED arrival. Saturation decreased to 72% nrb. Pt on CPAP upon ED arrival, O2 saturation 89%. '125mg'$  solumedrol IV given in route. Pt alert and oriented x 4 upon ED arrival.

## 2015-08-17 NOTE — ED Notes (Signed)
Sharon at [facility] made aware that orders to type/screen/crossmatch and transfuse have been canceled.

## 2015-08-17 NOTE — ED Notes (Signed)
CRITICAL VALUE ALERT  Critical value received: lactic acid 3.09  Date of notification:  08/17/2015   Time of notification:  3735  Critical value read back:Yes.    Nurse who received alert:  Iona Coach  MD notified (1st page):  zammitt  Time of first page:  1125    Time MD responded:  1125

## 2015-08-17 NOTE — H&P (Addendum)
[facility] History and Physical  Ryan [name] XMI:[id number] DOB: 03/08/1961 DOA: 08/17/2015  Referring physician: Emergency Department PCP: Ryan Bogus, MD  Specialists:   Chief Complaint: SOB  HPI: Ryan [name] is a 55 y.o. male with hx of copd, hld, prior seizures, currently treated MRSA bacteremia, DM2 who presented to the ED with complaints of increasing sob and hypoxia. Pt was found to have o2 sats in the 70's in the field. In the ED, pt noted to have resp rates into the 30's, ultimately requiring venturi mask. Patient was started on nebs and given one dose of solumedrol with some improvement. CXR with findings of questionable PNA. Hospitalist service consulted for consideration for admission.  Review of Systems:  Review of Systems  Constitutional: Negative for chills and weight loss.  HENT: Negative for ear discharge, ear pain and tinnitus.   Eyes: Negative for pain and discharge.  Respiratory: Positive for cough and shortness of breath.   Cardiovascular: Negative for palpitations and orthopnea.  Gastrointestinal: Negative for abdominal pain and diarrhea.  Genitourinary: Negative for frequency, hematuria and flank pain.  Musculoskeletal: Negative for back pain, falls and neck pain.  Neurological: Negative for sensory change, seizures and loss of consciousness.  Psychiatric/Behavioral: Negative for hallucinations and memory loss.     Past Medical History  Diagnosis Date  . COPD (chronic obstructive pulmonary disease) ([place])   . Hypercholesteremia   . History of pneumonia   . Polycythemia   . Coronary atherosclerosis of native coronary artery     Mild nonobstructive disease at catheterization 2007  . 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 Borderline diabetes   . Seizures ([place])     Last seizure 2 yrs ago  . GERD (gastroesophageal reflux disease)   . Type 2 diabetes mellitus ([place])   . DDD (degenerative disc disease)      Cervical and thoracic  . Chronic back pain   . Bronchitis   . Chronic left shoulder pain   . Chronic neck pain   . Atrial fibrillation ([place])     Not anticoagulated  . Smoker   . Type 2 diabetes mellitus without complication ([place])   . CAP (community acquired pneumonia) 07/10/2013    04/2015  . Collagen vascular disease ([place])   . Chronic respiratory failure [facility])    Past Surgical History  Procedure Laterality Date  . Colonoscopy w/ endoscopic [place]    . Vasectomy    . Throat biopsy    . Lung biopsy    . Vasectomy  1987  . Esophagogastroduodenoscopy N/A 05/04/2015    Procedure: ESOPHAGOGASTRODUODENOSCOPY (EGD);  Surgeon: Wilford Corner, MD;  Location: [facility] ENDOSCOPY;  Service: Endoscopy;  Laterality: N/A;   Social History:  reports that he quit smoking about 3 months ago. His smoking use included Cigarettes. He has a 45 pack-year smoking history. He has never used smokeless tobacco. He reports that he does not drink alcohol or use illicit drugs.  where does patient live--home, ALF, SNF? and with whom if at home?  Can patient participate in ADLs?  Allergies  Allergen Reactions  . Albuterol Palpitations  . Influenza Vaccine Live Swelling    Family History  Problem Relation Age of Onset  . Hypertension Mother   . Diabetes Mother   . Heart attack Mother   . Heart failure Mother   . Hypertension Sister   . Diabetes Sister   . Heart failure Sister      Prior  to Admission medications   Medication Sig Start Date End Date Taking? Authorizing Provider  albuterol (VENTOLIN HFA) 108 (90 BASE) MCG/ACT inhaler Inhale 2 puffs into the lungs every 6 (six) hours as needed for wheezing or shortness of breath.   Yes Historical Provider, MD  baclofen (LIORESAL) 10 MG tablet Take 10 mg by mouth 3 (three) times daily.  06/05/15  Yes Historical Provider, MD  benzonatate (TESSALON) 200 MG capsule Take 200 mg by mouth 3 (three) times daily.   Yes Historical Provider, MD   Dextromethorphan-Guaifenesin 20-400 MG TABS Take 1 tablet by mouth every morning.    Yes Historical Provider, MD  diltiazem (CARDIZEM CD) 120 MG 24 hr capsule Take 1 capsule (120 mg total) by mouth daily. 05/11/15  Yes Lendon Colonel, NP  diphenhydrAMINE (BENADRYL) 25 MG tablet Take 50 mg by mouth every morning.   Yes Historical Provider, MD  edoxaban (SAVAYSA) 60 MG TABS tablet Take 60 mg by mouth daily. 05/11/15  Yes Lendon Colonel, NP  fluticasone (FLONASE) 50 MCG/ACT nasal spray Place 2 sprays into both nostrils daily as needed for allergies.    Yes Historical Provider, MD  furosemide (LASIX) 40 MG tablet Take 40 mg by mouth 2 (two) times daily.   Yes Historical Provider, MD  insulin aspart (NOVOLOG) 100 UNIT/ML injection Inject 0-20 Units into the skin 3 (three) times daily with meals. 08/01/15  Yes [name], MD  levalbuterol Penne Lash) 0.63 MG/3ML nebulizer solution Take 3 mLs (0.63 mg total) by nebulization 3 (three) times daily. Patient taking differently: Take 0.63 mg by nebulization every 8 (eight) hours as needed for wheezing or shortness of breath.  09/15/14  Yes Rexene Alberts, MD  LORazepam (ATIVAN) 0.5 MG tablet Take 1 tablet (0.5 mg total) by mouth 2 (two) times daily as needed for anxiety or sleep. Patient taking differently: Take 0.5 mg by mouth 4 (four) times daily as needed for anxiety or sleep.  05/04/15  Yes Maryann Mikhail, DO  Magnesium 500 MG TABS Take 500 mg by mouth daily.   Yes Historical Provider, MD  metFORMIN (GLUCOPHAGE) 500 MG tablet Take 1 tablet (500 mg total) by mouth daily with breakfast. 09/15/14  Yes Rexene Alberts, MD  montelukast (SINGULAIR) 10 MG tablet Take 10 mg by mouth at bedtime.   Yes Historical Provider, MD  nicotine (NICODERM CQ - DOSED IN MG/24 HOURS) 14 mg/24hr patch Place 14 mg onto the skin daily.   Yes Historical Provider, MD  nitroGLYCERIN (NITROSTAT) 0.4 MG SL tablet Place 1 tablet (0.4 mg total) under the tongue every 5 (five) minutes  as needed for chest pain. 04/22/13  Yes Lendon Colonel, NP  oxyCODONE-acetaminophen (PERCOCET) 10-325 MG tablet Take 1 tablet by mouth every 4 (four) hours as needed for pain.   Yes Historical Provider, MD  pantoprazole (PROTONIX) 40 MG tablet Take 1 tablet (40 mg total) by mouth 2 (two) times daily. 05/05/15  Yes Maryann Mikhail, DO  potassium chloride SA (K-DUR,KLOR-CON) 20 MEQ tablet Take 20 mEq by mouth 2 (two) times daily.   Yes Historical Provider, MD  SPIRIVA RESPIMAT 2.5 MCG/ACT AERS Inhale 1 puff into the lungs daily. 06/20/15  Yes Historical Provider, MD  nicotine (NICODERM CQ - DOSED IN MG/24 HOURS) 21 mg/24hr patch Place 1 patch (21 mg total) onto the skin daily. Patient not taking: Reported on 08/17/2015 05/05/15   Cristal Ford, DO  PHARMACIST CHOICE LANCETS MISC USE 2 TIMES DAILY FOR DIABETIC TESTING 08/08/15   Orlena Sheldon,  PA-C  predniSONE (DELTASONE) 10 MG tablet Take 4 tablets (40 mg total) by mouth daily with breakfast. Patient not taking: Reported on 08/17/2015 08/01/15   [name], MD  vancomycin [facility]) 10 G SOLR injection  08/16/15   Historical Provider, MD   Physical Exam: Filed Vitals:   08/17/15 1230 08/17/15 1241 08/17/15 1300 08/17/15 1330  BP: 124/82  122/77 134/82  Pulse: 115  111 99  Temp:      Resp: '30  27 23  '$ Height:      Weight:      SpO2: 87% 93% 92% 98%     General:  Awake, in mild resp distress  Eyes: PERRL B  ENT: membranes moist, dentition fair  Neck: trachea midline, neck supple  Cardiovascular: regular, s1, s2  Respiratory: normal resp effort, no wheezing  Abdomen: soft,nondistended  Skin: normal skin turgor, no abnormal skin lesions seen  Musculoskeletal: perfused, no clubbing  Psychiatric: mood/affect normal // no auditory/visual hallucinations  Neurologic: cn2-12 grossly intact, strength/sensation intact  Labs on Admission:  Basic Metabolic Panel:  Recent Labs Lab 08/17/15 1118 08/17/15 1157  NA 137 135  K 2.9*  3.6  CL 98* 96*  CO2  --  31  GLUCOSE 148* 186*  BUN 8 13  CREATININE 0.90 1.23  CALCIUM  --  7.7*   Liver Function Tests:  Recent Labs Lab 08/17/15 1157  AST 217*  ALT 287*  ALKPHOS 112  BILITOT 1.0  PROT 6.3*  ALBUMIN 2.4*   No results for input(s): LIPASE, AMYLASE in the last 168 hours. No results for input(s): AMMONIA in the last 168 hours. CBC:  Recent Labs Lab 08/17/15 1110 08/17/15 1118 08/17/15 1157  WBC 3.4*  --  4.8  NEUTROABS 2.3  --  3.7  HGB 5.7* 7.5* 8.1*  HCT 18.6* 22.0* 25.8*  MCV 76.5*  --  76.1*  PLT 105*  --  152   Cardiac Enzymes: No results for input(s): CKTOTAL, CKMB, CKMBINDEX, TROPONINI in the last 168 hours.  BNP (last 3 results)  Recent Labs  07/15/15 1710 07/25/15 0828 08/17/15 1110  BNP 150.0* 113.0* 33.0    ProBNP (last 3 results) No results for input(s): PROBNP in the last 8760 hours.  CBG: No results for input(s): GLUCAP in the last 168 hours.  Radiological Exams on Admission: Dg Chest Portable 1 View  08/17/2015  CLINICAL DATA:  Shortness of breath. Pneumonia. Subsequent encounter. EXAM: PORTABLE CHEST 1 VIEW COMPARISON:  Single view of the chest 07/26/2015. FINDINGS: Endotracheal tube and NG tube been removed. Small right pleural effusion is decreased in size since the comparison study. There is streaky right basilar airspace disease. Mild left basilar atelectasis is unchanged. The lungs are markedly emphysematous. Scar in the left upper lobe is not well demonstrated due to overlying tubes. No pneumothorax. Heart size normal. IMPRESSION: Decreased right pleural effusion with persistent streaky right basilar airspace opacity which is worrisome for pneumonia. Emphysema. Electronically Signed   By: Inge Rise M.D.   On: 08/17/2015 11:17    Assessment/Plan Principal Problem:   Acute respiratory failure with hypoxia (HCC) Active Problems:   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  quit smoking 05/01/15    Type 2 diabetes mellitus  without complication (HCC)   Atrial fibrillation with rapid ventricular response (HCC)   Chronic respiratory failure with hypoxia (HCC)   COPD (chronic obstructive pulmonary disease) ([place])   1. Acute on chronic respiratory failure secondary to COPD 1. Improved with nebs and steroids, will  continue scheduled 2. Admit to stepdown 3. Cont to wean O2 as tolerated 4. Start empiric vanc and cefepime per below 2. Pneumonia 1. Questionable PNA on CXR 2. Will cover for HCAP with vanc and cefepime 3. DM2 1. Cont on SSI coverage 4. Afib 1. Rate controlled at present 2. On edoxaban for prophylaxis prior to admit, will continue 3. Monitor 5. Hx of MRSA bacteremia 1. Recently admitted and found to have MRSA bacteremia 2. Had been on vanc prior to admit (28 days of vanc originally planned as outpatient starting 1/12) 3. Continue abx per above 4. Follow repeat blood cultures 6. DVT prophylaxis 1. Continue edoxaban as per above  Code Status: Full Family Communication: Pt in room Disposition Plan: Admit stepdown   [place] Pager [phone]  If 7PM-7AM, please contact night-coverage [email or web address] Password [email or web address] 08/17/2015, 1:57 PM

## 2015-08-17 NOTE — ED Notes (Signed)
Triple lumen PICC to left brachial, purple lumen gives no blood return, the other two ports give scant blood return, all three lumens flush. Dressing reinforced.

## 2015-08-17 NOTE — ED Provider Notes (Signed)
CSN: [id number]     Arrival date & time    History  By signing my name below, I, Stephania Fragmin, attest that this documentation has been prepared under the direction and in the presence of Milton Ferguson, MD. Electronically Signed: Stephania Fragmin, ED Scribe. 08/17/2015. 12:03 PM.    Chief Complaint  Patient presents with  . Shortness of Breath   Patient is a 55 y.o. male presenting with shortness of breath. The history is provided by the EMS personnel and medical records. No language interpreter was used.  Shortness of Breath Severity:  Severe Duration:  45 minutes Timing:  Constant Progression:  Worsening Chronicity:  Recurrent Relieved by:  Oxygen Worsened by:  Nothing tried Ineffective treatments:  None tried Associated symptoms: no abdominal pain, no chest pain, no cough, no headaches and no rash   Risk factors: tobacco use (former smoker, per medical records)   Risk factors comment:  COPD   HPI Comments: Ryan [name] is a 55 y.o. male with a history of COPD, brought in by ambulance, who presents to the Emergency Department complaining of constant, severe, SOB that began 45 minutes ago. Per EMS, patient's O2 saturation level was 82% upon arrival. However, this decreased to 72% en route. After being given a CPAP and 125 mg Solu-medrol, his saturation improved to 89%. However, his HR was 170. Per EMS, patient was tested positive for MRSA, which is why he currently has a PICC line, placed 1 week ago.  Past Medical History  Diagnosis Date  . COPD (chronic obstructive pulmonary disease) ([place])   . Hypercholesteremia   . History of pneumonia   . Polycythemia   . Coronary atherosclerosis of native coronary artery     Mild nonobstructive disease at catheterization 2007  . 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 Borderline diabetes   . Seizures ([place])     Last seizure 2 yrs ago  . GERD (gastroesophageal reflux disease)   . Type 2 diabetes mellitus ([place])   .  DDD (degenerative disc disease)     Cervical and thoracic  . Chronic back pain   . Bronchitis   . Chronic left shoulder pain   . Chronic neck pain   . Atrial fibrillation ([place])     Not anticoagulated  . Smoker   . Type 2 diabetes mellitus without complication ([place])   . CAP (community acquired pneumonia) 07/10/2013    04/2015  . Collagen vascular disease ([place])   . Chronic respiratory failure [facility])    Past Surgical History  Procedure Laterality Date  . Colonoscopy w/ endoscopic [place]    . Vasectomy    . Throat biopsy    . Lung biopsy    . Vasectomy  1987  . Esophagogastroduodenoscopy N/A 05/04/2015    Procedure: ESOPHAGOGASTRODUODENOSCOPY (EGD);  Surgeon: Wilford Corner, MD;  Location: [facility] ENDOSCOPY;  Service: Endoscopy;  Laterality: N/A;   Family History  Problem Relation Age of Onset  . Hypertension Mother   . Diabetes Mother   . Heart attack Mother   . Heart failure Mother   . Hypertension Sister   . Diabetes Sister   . Heart failure Sister    Social History  Substance Use Topics  . Smoking status: Former Smoker -- 1.00 packs/day for 45 years    Types: Cigarettes    Quit date: 05/01/2015  . Smokeless tobacco: Never Used  . Alcohol Use: No    Review of Systems  Constitutional:  Negative for appetite change and fatigue.  HENT: Negative for congestion, ear discharge and sinus pressure.   Eyes: Negative for discharge.  Respiratory: Positive for shortness of breath. Negative for cough.   Cardiovascular: Negative for chest pain.  Gastrointestinal: Negative for abdominal pain and diarrhea.  Genitourinary: Negative for frequency and hematuria.  Musculoskeletal: Negative for back pain.  Skin: Negative for rash.  Neurological: Negative for seizures and headaches.  Psychiatric/Behavioral: Negative for hallucinations.   Allergies  Albuterol and Influenza vaccine live  Home Medications   Prior to Admission medications   Medication Sig Start Date End Date Taking?  Authorizing Provider  albuterol (VENTOLIN HFA) 108 (90 BASE) MCG/ACT inhaler Inhale 2 puffs into the lungs every 6 (six) hours as needed for wheezing or shortness of breath.    Historical Provider, MD  baclofen (LIORESAL) 10 MG tablet Take 10 mg by mouth 3 (three) times daily as needed. 06/05/15   Historical Provider, MD  budesonide-formoterol (SYMBICORT) 160-4.5 MCG/ACT inhaler Inhale 2 puffs into the lungs 2 (two) times daily.    Historical Provider, MD  Dextromethorphan-Guaifenesin 20-400 MG TABS Take 1 tablet by mouth every 6 (six) hours as needed (mucus).    Historical Provider, MD  diltiazem (CARDIZEM CD) 120 MG 24 hr capsule Take 1 capsule (120 mg total) by mouth daily. 05/11/15   Lendon Colonel, NP  edoxaban (SAVAYSA) 60 MG TABS tablet Take 60 mg by mouth daily. 05/11/15   Lendon Colonel, NP  fluticasone (FLONASE) 50 MCG/ACT nasal spray Place 2 sprays into both nostrils daily.    Historical Provider, MD  insulin aspart (NOVOLOG) 100 UNIT/ML injection Inject 0-20 Units into the skin 3 (three) times daily with meals. 08/01/15   [name], MD  insulin aspart (NOVOLOG) 100 UNIT/ML injection Inject 0-5 Units into the skin at bedtime. 08/01/15   [name], MD  levalbuterol Penne Lash) 0.63 MG/3ML nebulizer solution Take 3 mLs (0.63 mg total) by nebulization 3 (three) times daily. Patient taking differently: Take 0.63 mg by nebulization every 8 (eight) hours as needed for wheezing or shortness of breath.  09/15/14   Rexene Alberts, MD  LORazepam (ATIVAN) 0.5 MG tablet Take 1 tablet (0.5 mg total) by mouth 2 (two) times daily as needed for anxiety or sleep. 05/04/15   Maryann Mikhail, DO  metFORMIN (GLUCOPHAGE) 500 MG tablet Take 1 tablet (500 mg total) by mouth daily with breakfast. 09/15/14   Rexene Alberts, MD  montelukast (SINGULAIR) 10 MG tablet Take 10 mg by mouth at bedtime.    Historical Provider, MD  nicotine (NICODERM CQ - DOSED IN MG/24 HOURS) 21 mg/24hr patch Place 1 patch (21 mg  total) onto the skin daily. 05/05/15   Maryann Mikhail, DO  nitroGLYCERIN (NITROSTAT) 0.4 MG SL tablet Place 1 tablet (0.4 mg total) under the tongue every 5 (five) minutes as needed for chest pain. 04/22/13   Lendon Colonel, NP  oxyCODONE-acetaminophen (PERCOCET) 10-325 MG tablet Take 1 tablet by mouth every 4 (four) hours as needed for pain.    Historical Provider, MD  pantoprazole (PROTONIX) 40 MG tablet Take 1 tablet (40 mg total) by mouth 2 (two) times daily. 05/05/15   Maryann Mikhail, DO  PHARMACIST CHOICE LANCETS MISC USE 2 TIMES DAILY FOR DIABETIC TESTING 08/08/15   Orlena Sheldon, PA-C  predniSONE (DELTASONE) 10 MG tablet Take 4 tablets (40 mg total) by mouth daily with breakfast. 08/01/15   [name], MD  promethazine-dextromethorphan (PROMETHAZINE-DM) 6.25-15 MG/5ML syrup Take 5 mLs by mouth  4 (four) times daily as needed for cough.    Historical Provider, MD  SPIRIVA RESPIMAT 2.5 MCG/ACT AERS Inhale 1 puff into the lungs daily. 06/20/15   Historical Provider, MD   BP 114/69 mmHg  Pulse 125  Temp(Src) 99.8 F (37.7 C)  Resp 28  Ht '5\' 7"'$  (1.702 m)  Wt 140 lb (63.504 kg)  BMI 21.92 kg/m2  SpO2 96% Physical Exam  Constitutional: He is oriented to person, place, and time. He appears well-developed.  HENT:  Head: Normocephalic.  Eyes: Conjunctivae and EOM are normal. No scleral icterus.  Neck: Neck supple. No thyromegaly present.  Cardiovascular: Normal rate and regular rhythm.  Exam reveals no gallop and no friction rub.   No murmur heard. Pulmonary/Chest: No stridor. He has wheezes. He has no rales. He exhibits no tenderness.  Abdominal: He exhibits no distension. There is no tenderness. There is no rebound.  Musculoskeletal: Normal range of motion. He exhibits no edema.  Lymphadenopathy:    He has no cervical adenopathy.  Neurological: He is oriented to person, place, and time. He exhibits normal muscle tone. Coordination normal.  Skin: No rash noted. No erythema.   Psychiatric: He has a normal mood and affect. His behavior is normal.  Nursing note and vitals reviewed.   ED Course  Procedures (including critical care time)  DIAGNOSTIC STUDIES: Oxygen Saturation is 96% on RA, normal by my interpretation.    COORDINATION OF CARE: Labs Review Labs Reviewed  CBC WITH DIFFERENTIAL/PLATELET - Abnormal; Notable for the following:    WBC 3.4 (*)    RBC 2.43 (*)    Hemoglobin 5.7 (*)    HCT 18.6 (*)    MCV 76.5 (*)    MCH 23.5 (*)    RDW 21.4 (*)    Platelets 105 (*)    Lymphs Abs 0.6 (*)    All other components within normal limits  I-STAT CG4 LACTIC ACID, ED - Abnormal; Notable for the following:    Lactic Acid, Venous 3.09 (*)    All other components within normal limits  I-STAT CHEM 8, ED - Abnormal; Notable for the following:    Potassium 2.9 (*)    Chloride 98 (*)    Glucose, Bld 148 (*)    Calcium, Ion 0.91 (*)    Hemoglobin 7.5 (*)    HCT 22.0 (*)    All other components within normal limits  BRAIN NATRIURETIC PEPTIDE  COMPREHENSIVE METABOLIC PANEL  VITAMIN Z12  FOLATE  IRON AND TIBC  FERRITIN  RETICULOCYTES  CBC WITH DIFFERENTIAL/PLATELET  COMPREHENSIVE METABOLIC PANEL  I-STAT TROPOININ, ED  POC OCCULT BLOOD, ED  TYPE AND SCREEN  PREPARE RBC (CROSSMATCH)    Imaging Review Dg Chest Portable 1 View  08/17/2015  CLINICAL DATA:  Shortness of breath. Pneumonia. Subsequent encounter. EXAM: PORTABLE CHEST 1 VIEW COMPARISON:  Single view of the chest 07/26/2015. FINDINGS: Endotracheal tube and NG tube been removed. Small right pleural effusion is decreased in size since the comparison study. There is streaky right basilar airspace disease. Mild left basilar atelectasis is unchanged. The lungs are markedly emphysematous. Scar in the left upper lobe is not well demonstrated due to overlying tubes. No pneumothorax. Heart size normal. IMPRESSION: Decreased right pleural effusion with persistent streaky right basilar airspace opacity  which is worrisome for pneumonia. Emphysema. Electronically Signed   By: Inge Rise M.D.   On: 08/17/2015 11:17   I have personally reviewed and evaluated these images and lab results as part of  my medical decision-making.   EKG Interpretation   Date/Time:  Friday August 17 2015 10:58:41 EST Ventricular Rate:  134 PR Interval:  111 QRS Duration: 78 QT Interval:  287 QTC Calculation: 428 R Axis:   58 Text Interpretation:  Sinus tachycardia Confirmed by Zurie Platas  MD, Saloma Cadena  [phone]) on 08/17/2015 1:52:32 PM     CRITICAL CARE Performed by: Jasmaine Rochel L Total critical care time: 40 minutes Critical care time was exclusive of separately billable procedures and treating other patients. Critical care was necessary to treat or prevent imminent or life-threatening deterioration. Critical care was time spent personally by me on the following activities: development of treatment plan with patient and/or surrogate as well as nursing, discussions with consultants, evaluation of patient's response to treatment, examination of patient, obtaining history from patient or surrogate, ordering and performing treatments and interventions, ordering and review of laboratory studies, ordering and review of radiographic studies, pulse oximetry and re-evaluation of patient's condition.  MDM   Final diagnoses:  None    COPD exacerbation admit to stepdown   The chart was scribed for me under my direct supervision.  I personally performed the history, physical, and medical decision making and all procedures in the evaluation of this patient..   The chart was scribed for me under my direct supervision.  I personally performed the history, physical, and medical decision making and all procedures in the evaluation of this patient.Milton Ferguson, MD 08/17/15 [phone]

## 2015-08-17 NOTE — Progress Notes (Addendum)
Pharmacy Antibiotic Note  Ryan [name] is a 55 y.o. male admitted on 08/17/2015 with pneumonia.  Pharmacy has been consulted for vancomycin and renal dose antibiotics.    Plan: Vancomycin 1250 IV every 12 hours.  Goal trough 15-20 mcg/mL.  Height: '5\' 7"'$  (170.2 cm) Weight: 140 lb (63.504 kg) IBW/kg (Calculated) : 66.1  Temp (24hrs), Avg:99.8 F (37.7 C), Min:99.8 F (37.7 C), Max:99.8 F (37.7 C)   Recent Labs Lab 08/17/15 1110 08/17/15 1118 08/17/15 1119 08/17/15 1157  WBC 3.4*  --   --  4.8  CREATININE  --  0.90  --  1.23  LATICACIDVEN  --   --  3.09*  --     Estimated Creatinine Clearance: 60.2 mL/min (by C-G formula based on Cr of 1.23).    Allergies  Allergen Reactions  . Albuterol Palpitations  . Influenza Vaccine Live Swelling    Antimicrobials this admission: He was on vancomycin 1250 mg IV q12 hours PTA with an expected stop date of 08/23/15.  His dose was 1250 mg q12 hours and trough values were therapeutic with this dose.  Cefepime dose appropriate for renal function Cefepime 2/3 >>   Microbiology results: Previously being treated for MRSA bacteremia  Thank you for allowing pharmacy to be a part of this patient's care.  Excell Seltzer [place] 08/17/2015 2:22 PM

## 2015-08-17 NOTE — ED Notes (Signed)
CRITICAL VALUE ALERT  Critical value received:  hbg 5.7   Date of notification:  08/17/2015  Time of notification:  3716  Critical value read back:Yes.    Nurse who received alert:  Iona Coach  MD notified (1st page):  zammitt  Time of first page:  1135  Time MD responded:  [id number]

## 2015-08-17 NOTE — ED Notes (Signed)
Concern of hemodilution from PICC specimen, labs reordered.

## 2015-08-17 NOTE — ED Notes (Signed)
Report given to [facility] in ICU, all questions answered.

## 2015-08-18 LAB — GLUCOSE, CAPILLARY
GLUCOSE-CAPILLARY: 292 mg/dL — AB (ref 65–99)
GLUCOSE-CAPILLARY: 459 mg/dL — AB (ref 65–99)
Glucose-Capillary: 417 mg/dL — ABNORMAL HIGH (ref 65–99)
Glucose-Capillary: 355 mg/dL — ABNORMAL HIGH (ref 65–99)

## 2015-08-18 LAB — COMPREHENSIVE METABOLIC PANEL
ALK PHOS: 103 U/L (ref 38–126)
ALT: 319 U/L — AB (ref 17–63)
ANION GAP: 10 (ref 5–15)
AST: 223 U/L — ABNORMAL HIGH (ref 15–41)
Albumin: 2.3 g/dL — ABNORMAL LOW (ref 3.5–5.0)
BUN: 21 mg/dL — ABNORMAL HIGH (ref 6–20)
CALCIUM: 7.9 mg/dL — AB (ref 8.9–10.3)
CO2: 31 mmol/L (ref 22–32)
CREATININE: 1.46 mg/dL — AB (ref 0.61–1.24)
Chloride: 97 mmol/L — ABNORMAL LOW (ref 101–111)
GFR, EST NON AFRICAN AMERICAN: 52 mL/min — AB (ref >60)
Glucose, Bld: 455 mg/dL — ABNORMAL HIGH (ref 65–99)
Potassium: 3.9 mmol/L (ref 3.5–5.1)
SODIUM: 138 mmol/L (ref 135–145)
TOTAL PROTEIN: 6.4 g/dL — AB (ref 6.5–8.1)
Total Bilirubin: 0.6 mg/dL (ref 0.3–1.2)

## 2015-08-18 LAB — CBC
HEMATOCRIT: 24.3 % — AB (ref 39.0–52.0)
Hemoglobin: 7.7 g/dL — ABNORMAL LOW (ref 13.0–17.0)
MCH: 24.2 pg — ABNORMAL LOW (ref 26.0–34.0)
MCHC: 31.7 g/dL (ref 30.0–36.0)
MCV: 76.4 fL — AB (ref 78.0–100.0)
Platelets: 158 10*3/uL (ref 150–400)
RBC: 3.18 MIL/uL — AB (ref 4.22–5.81)
RDW: 21 % — AB (ref 11.5–15.5)
WBC: 3.6 10*3/uL — AB (ref 4.0–10.5)

## 2015-08-18 LAB — HIV ANTIBODY (ROUTINE TESTING W REFLEX): HIV SCREEN 4TH GENERATION: NONREACTIVE

## 2015-08-18 LAB — MRSA PCR SCREENING: MRSA by PCR: NEGATIVE

## 2015-08-18 MED ORDER — INSULIN ASPART 100 UNIT/ML ~~LOC~~ SOLN
0.0000 [IU] | Freq: Three times a day (TID) | SUBCUTANEOUS | Status: DC
  Administered 2015-08-18: 20 [IU] via SUBCUTANEOUS
  Administered 2015-08-18: 11 [IU] via SUBCUTANEOUS
  Administered 2015-08-18 – 2015-08-19 (×2): 20 [IU] via SUBCUTANEOUS
  Administered 2015-08-19: 15 [IU] via SUBCUTANEOUS
  Administered 2015-08-19 – 2015-08-20 (×2): 20 [IU] via SUBCUTANEOUS
  Administered 2015-08-20: 3 [IU] via SUBCUTANEOUS
  Administered 2015-08-20: 11 [IU] via SUBCUTANEOUS
  Administered 2015-08-21: 20 [IU] via SUBCUTANEOUS
  Administered 2015-08-21: 11 [IU] via SUBCUTANEOUS
  Administered 2015-08-21: 4 [IU] via SUBCUTANEOUS
  Administered 2015-08-22: 3 [IU] via SUBCUTANEOUS
  Administered 2015-08-22 (×2): 15 [IU] via SUBCUTANEOUS
  Administered 2015-08-23: 11 [IU] via SUBCUTANEOUS
  Administered 2015-08-23: 15 [IU] via SUBCUTANEOUS
  Administered 2015-08-23 – 2015-08-24 (×2): 4 [IU] via SUBCUTANEOUS
  Administered 2015-08-24: 15 [IU] via SUBCUTANEOUS
  Administered 2015-08-24: 20 [IU] via SUBCUTANEOUS
  Administered 2015-08-25: 15 [IU] via SUBCUTANEOUS
  Administered 2015-08-25: 11 [IU] via SUBCUTANEOUS

## 2015-08-18 MED ORDER — NICOTINE 14 MG/24HR TD PT24
14.0000 mg | MEDICATED_PATCH | Freq: Every day | TRANSDERMAL | Status: DC
  Administered 2015-08-18 – 2015-08-25 (×8): 14 mg via TRANSDERMAL
  Filled 2015-08-18 (×8): qty 1

## 2015-08-18 MED ORDER — SODIUM CHLORIDE 0.9 % IV SOLN
INTRAVENOUS | Status: DC
  Administered 2015-08-18 – 2015-08-24 (×8): via INTRAVENOUS

## 2015-08-18 MED ORDER — INSULIN ASPART 100 UNIT/ML ~~LOC~~ SOLN
0.0000 [IU] | Freq: Every day | SUBCUTANEOUS | Status: DC
  Administered 2015-08-19: 4 [IU] via SUBCUTANEOUS
  Administered 2015-08-21: 2 [IU] via SUBCUTANEOUS
  Administered 2015-08-22: 5 [IU] via SUBCUTANEOUS
  Administered 2015-08-23: 2 [IU] via SUBCUTANEOUS

## 2015-08-18 MED ORDER — PANTOPRAZOLE SODIUM 40 MG PO TBEC
40.0000 mg | DELAYED_RELEASE_TABLET | Freq: Every day | ORAL | Status: DC
  Administered 2015-08-18 – 2015-08-25 (×8): 40 mg via ORAL
  Filled 2015-08-18 (×8): qty 1

## 2015-08-18 MED ORDER — INSULIN ASPART 100 UNIT/ML ~~LOC~~ SOLN
10.0000 [IU] | Freq: Once | SUBCUTANEOUS | Status: AC
  Administered 2015-08-18: 10 [IU] via SUBCUTANEOUS

## 2015-08-18 NOTE — Progress Notes (Signed)
**Note De-Identified Ishaan Villamar Obfuscation** Patient placed on 12 L HFNC; tolerating well.  RRT to continue to monitor.

## 2015-08-18 NOTE — Progress Notes (Signed)
Pt CBG 417, notified Dr Anastasio Champion, on call for Dr Luan Pulling.  No extra insulin coverage at this time.  Adjusted insulin to include night coverage.  Will continue to monitor.

## 2015-08-18 NOTE — Progress Notes (Signed)
Ryan [name] [place] WVP:[id number] DOB: 10-04-1960 DOA: 08/17/2015 PCP: Alonza Bogus, MD   Subjective: This man was admitted with exacerbation of COPD with underlying possible pneumonia. His oxygen requirements were increased from 8 L/m at home more recently and now he is on 12 L/m. He does feel better today than he did yesterday.           Physical Exam: Blood pressure 114/74, pulse 76, temperature 97.6 F (36.4 C), temperature source Oral, resp. rate 12, height '5\' 8"'$  (1.727 m), weight 70.1 kg (154 lb 8.7 oz), SpO2 96 %. He looks chronically sick but does not appear to be in any acute respiratory distress. Lung fields largely are clear with just a few crackles scattered in the bases. There is no wheezing. There is no peripheral central cyanosis. He is alert and orientated.   Investigations:  Recent Results (from the past 240 hour(s))  Culture, blood (routine x 2) Call MD if unable to obtain prior to antibiotics being given     Status: None (Preliminary result)   Collection Time: 08/17/15  2:21 PM  Result Value Ref Range Status   Specimen Description RIGHT ANTECUBITAL  Final   Special Requests BOTTLES DRAWN AEROBIC AND ANAEROBIC [facility]  Final   Culture PENDING  Incomplete   Report Status PENDING  Incomplete  Culture, blood (routine x 2) Call MD if unable to obtain prior to antibiotics being given     Status: None (Preliminary result)   Collection Time: 08/17/15  2:28 PM  Result Value Ref Range Status   Specimen Description BLOOD LEFT HAND  Final   Special Requests BOTTLES DRAWN AEROBIC AND ANAEROBIC [facility]  Final   Culture PENDING  Incomplete   Report Status PENDING  Incomplete  MRSA PCR Screening     Status: None   Collection Time: 08/17/15  7:00 PM  Result Value Ref Range Status   MRSA by PCR NEGATIVE NEGATIVE Final    Comment:        The GeneXpert MRSA Assay (FDA approved for NASAL specimens only), is one component of a comprehensive MRSA colonization surveillance program. It  is not intended to diagnose MRSA infection nor to guide or monitor treatment for MRSA infections.      Basic Metabolic Panel:  Recent Labs  08/17/15 1157 08/18/15 0533  NA 135 138  K 3.6 3.9  CL 96* 97*  CO2 31 31  GLUCOSE 186* 455*  BUN 13 21*  CREATININE 1.23 1.46*  CALCIUM 7.7* 7.9*   Liver Function Tests:  Recent Labs  08/17/15 1157 08/18/15 0533  AST 217* 223*  ALT 287* 319*  ALKPHOS 112 103  BILITOT 1.0 0.6  PROT 6.3* 6.4*  ALBUMIN 2.4* 2.3*     CBC:  Recent Labs  08/17/15 1110  08/17/15 1157 08/18/15 0533  WBC 3.4*  --  4.8 3.6*  NEUTROABS 2.3  --  3.7  --   HGB 5.7*  < > 8.1* 7.7*  HCT 18.6*  < > 25.8* 24.3*  MCV 76.5*  --  76.1* 76.4*  PLT 105*  --  152 158  < > = values in this interval not displayed.  Dg Chest Portable 1 View  08/17/2015  CLINICAL DATA:  Shortness of breath. Pneumonia. Subsequent encounter. EXAM: PORTABLE CHEST 1 VIEW COMPARISON:  Single view of the chest 07/26/2015. FINDINGS: Endotracheal tube and NG tube been removed. Small right pleural effusion is decreased in size since the comparison study. There is streaky right basilar airspace disease. Mild left  basilar atelectasis is unchanged. The lungs are markedly emphysematous. Scar in the left upper lobe is not well demonstrated due to overlying tubes. No pneumothorax. Heart size normal. IMPRESSION: Decreased right pleural effusion with persistent streaky right basilar airspace opacity which is worrisome for pneumonia. Emphysema. Electronically Signed   By: Inge Rise M.D.   On: 08/17/2015 11:17      Medications: I have reviewed the patient's current medications.  Impression: 1. Acute respiratory failure with hypoxia. This is secondary to COPD and probable underlying pneumonia. 2. Diabetes. 3. Worsening renal function.     Plan: 1. Continue with current medications of antibiotics and steroids. Try to wean oxygen requirements down. 2. IV fluids for worsening renal  function, he may be somewhat dehydrated.  Consultants:  None.   Procedures:  None.                      Code Status: Full code.  Family Communication: I discussed the plan with the patient at the bedside.   Disposition Plan: Home when medically stable.  Time spent: 20 minutes.   LOS: 1 day   [place] C   08/18/2015, 7:52 AM

## 2015-08-19 ENCOUNTER — Inpatient Hospital Stay (HOSPITAL_COMMUNITY): Payer: Medicare Other

## 2015-08-19 LAB — COMPREHENSIVE METABOLIC PANEL
ALBUMIN: 2.3 g/dL — AB (ref 3.5–5.0)
ALT: 301 U/L — ABNORMAL HIGH (ref 17–63)
ANION GAP: 11 (ref 5–15)
AST: 141 U/L — ABNORMAL HIGH (ref 15–41)
Alkaline Phosphatase: 88 U/L (ref 38–126)
BUN: 33 mg/dL — ABNORMAL HIGH (ref 6–20)
CHLORIDE: 96 mmol/L — AB (ref 101–111)
CO2: 29 mmol/L (ref 22–32)
Calcium: 7.9 mg/dL — ABNORMAL LOW (ref 8.9–10.3)
Creatinine, Ser: 1.28 mg/dL — ABNORMAL HIGH (ref 0.61–1.24)
GFR calc Af Amer: >60 mL/min (ref >60)
GFR calc non Af Amer: >60 mL/min (ref >60)
GLUCOSE: 340 mg/dL — AB (ref 65–99)
POTASSIUM: 3.1 mmol/L — AB (ref 3.5–5.1)
SODIUM: 136 mmol/L (ref 135–145)
Total Bilirubin: 0.4 mg/dL (ref 0.3–1.2)
Total Protein: 5.9 g/dL — ABNORMAL LOW (ref 6.5–8.1)

## 2015-08-19 LAB — GLUCOSE, CAPILLARY
GLUCOSE-CAPILLARY: 321 mg/dL — AB (ref 65–99)
Glucose-Capillary: 365 mg/dL — ABNORMAL HIGH (ref 65–99)
Glucose-Capillary: 354 mg/dL — ABNORMAL HIGH (ref 65–99)
Glucose-Capillary: 347 mg/dL — ABNORMAL HIGH (ref 65–99)

## 2015-08-19 LAB — STREP PNEUMONIAE URINARY ANTIGEN: Strep Pneumo Urinary Antigen: NEGATIVE

## 2015-08-19 LAB — CBC
HEMATOCRIT: 23 % — AB (ref 39.0–52.0)
Hemoglobin: 7.1 g/dL — ABNORMAL LOW (ref 13.0–17.0)
MCH: 23.5 pg — AB (ref 26.0–34.0)
MCHC: 30.9 g/dL (ref 30.0–36.0)
MCV: 76.2 fL — AB (ref 78.0–100.0)
Platelets: 207 10*3/uL (ref 150–400)
RBC: 3.02 MIL/uL — AB (ref 4.22–5.81)
RDW: 21.5 % — AB (ref 11.5–15.5)
WBC: 5.9 10*3/uL (ref 4.0–10.5)

## 2015-08-19 LAB — VANCOMYCIN, TROUGH
VANCOMYCIN TR: 24 ug/mL — AB (ref 10.0–20.0)
VANCOMYCIN TR: 43 ug/mL — AB (ref 10.0–20.0)

## 2015-08-19 MED ORDER — INSULIN GLARGINE 100 UNIT/ML ~~LOC~~ SOLN
20.0000 [IU] | Freq: Every day | SUBCUTANEOUS | Status: DC
  Administered 2015-08-19: 20 [IU] via SUBCUTANEOUS
  Filled 2015-08-19 (×3): qty 0.2

## 2015-08-19 MED ORDER — VANCOMYCIN HCL IN DEXTROSE 750-5 MG/150ML-% IV SOLN
750.0000 mg | Freq: Two times a day (BID) | INTRAVENOUS | Status: DC
  Administered 2015-08-19 – 2015-08-25 (×13): 750 mg via INTRAVENOUS
  Filled 2015-08-19 (×6): qty 150

## 2015-08-19 MED ORDER — ALTEPLASE 2 MG IJ SOLR
2.0000 mg | Freq: Once | INTRAMUSCULAR | Status: AC
  Administered 2015-08-19: 2 mg
  Filled 2015-08-19: qty 2

## 2015-08-19 MED ORDER — LORAZEPAM 0.5 MG PO TABS
0.5000 mg | ORAL_TABLET | ORAL | Status: DC | PRN
Start: 2015-08-19 — End: 2015-08-25
  Administered 2015-08-19 – 2015-08-24 (×9): 0.5 mg via ORAL
  Filled 2015-08-19 (×9): qty 1

## 2015-08-19 MED ORDER — OXYCODONE-ACETAMINOPHEN 5-325 MG PO TABS
1.0000 | ORAL_TABLET | ORAL | Status: DC
  Administered 2015-08-19 – 2015-08-21 (×12): 1 via ORAL
  Filled 2015-08-19 (×11): qty 1

## 2015-08-19 MED ORDER — ALUM & MAG HYDROXIDE-SIMETH 200-200-20 MG/5ML PO SUSP: 30.0000 mL | Freq: Four times a day (QID) | ORAL | Status: DC | PRN

## 2015-08-19 MED ORDER — POTASSIUM CHLORIDE CRYS ER 20 MEQ PO TBCR
40.0000 meq | EXTENDED_RELEASE_TABLET | Freq: Once | ORAL | Status: AC
  Administered 2015-08-19: 40 meq via ORAL
  Filled 2015-08-19: qty 2

## 2015-08-19 MED ORDER — METHYLPREDNISOLONE SODIUM SUCC 125 MG IJ SOLR
60.0000 mg | Freq: Two times a day (BID) | INTRAMUSCULAR | Status: DC
  Administered 2015-08-19 – 2015-08-24 (×10): 60 mg via INTRAVENOUS
  Filled 2015-08-19 (×10): qty 2

## 2015-08-19 NOTE — Progress Notes (Signed)
Ryan [name] KGM:[id number] DOB: 1961-04-01 DOA: 08/17/2015 PCP: Alonza Bogus, MD   Subjective: This man was admitted with exacerbation of COPD with underlying possible pneumonia. His oxygen requirements were increased from 8 L/m at home to 12 L/m but now he has improved today and appears to be at 9 L/m. He does feel somewhat better.[name]           Physical Exam: Blood pressure 125/83, pulse 71, temperature 97 F (36.1 C), temperature source Oral, resp. rate 16, height '5\' 8"'$  (1.727 m), weight 70.9 kg (156 lb 4.9 oz), SpO2 97 %. He looks chronically sick but does not appear to be in any acute respiratory distress. Lung fields largely are clear with just a few crackles scattered in the bases. There is no wheezing. Overall, the lung fields sound clearer than yesterday. There is no peripheral central cyanosis. He is alert and orientated.   Investigations:  Recent Results (from the past 240 hour(s))  Culture, blood (routine x 2) Call MD if unable to obtain prior to antibiotics being given     Status: None (Preliminary result)   Collection Time: 08/17/15  2:21 PM  Result Value Ref Range Status   Specimen Description RIGHT ANTECUBITAL  Final   Special Requests BOTTLES DRAWN AEROBIC AND ANAEROBIC [facility]  Final   Culture PENDING  Incomplete   Report Status PENDING  Incomplete  Culture, blood (routine x 2) Call MD if unable to obtain prior to antibiotics being given     Status: None (Preliminary result)   Collection Time: 08/17/15  2:28 PM  Result Value Ref Range Status   Specimen Description BLOOD LEFT HAND  Final   Special Requests BOTTLES DRAWN AEROBIC AND ANAEROBIC [facility]  Final   Culture PENDING  Incomplete   Report Status PENDING  Incomplete  MRSA PCR Screening     Status: None   Collection Time: 08/17/15  7:00 PM  Result Value Ref Range Status   MRSA by PCR NEGATIVE NEGATIVE Final    Comment:        The GeneXpert MRSA Assay (FDA approved for NASAL specimens only), is one component  of a comprehensive MRSA colonization surveillance program. It is not intended to diagnose MRSA infection nor to guide or monitor treatment for MRSA infections.   Culture, respiratory (NON-Expectorated)     Status: None (Preliminary result)   Collection Time: 08/18/15  4:16 PM  Result Value Ref Range Status   Specimen Description SPUTUM EXPECTORATED  Final   Special Requests NONE  Final   Gram Stain   Final    RARE WBC PRESENT, PREDOMINANTLY PMN FEW SQUAMOUS EPITHELIAL CELLS PRESENT RARE GRAM POSITIVE COCCI IN PAIRS Performed at [facility]    Culture PENDING  Incomplete   Report Status PENDING  Incomplete     Basic Metabolic Panel:  Recent Labs  08/18/15 0533 08/19/15 0514  NA 138 136  K 3.9 3.1*  CL 97* 96*  CO2 31 29  GLUCOSE 455* 340*  BUN 21* 33*  CREATININE 1.46* 1.28*  CALCIUM 7.9* 7.9*   Liver Function Tests:  Recent Labs  08/18/15 0533 08/19/15 0514  AST 223* 141*  ALT 319* 301*  ALKPHOS 103 88  BILITOT 0.6 0.4  PROT 6.4* 5.9*  ALBUMIN 2.3* 2.3*     CBC:  Recent Labs  08/17/15 1110  08/17/15 1157 08/18/15 0533 08/19/15 0514  WBC 3.4*  --  4.8 3.6* 5.9  NEUTROABS 2.3  --  3.7  --   --  HGB 5.7*  < > 8.1* 7.7* 7.1*  HCT 18.6*  < > 25.8* 24.3* 23.0*  MCV 76.5*  --  76.1* 76.4* 76.2*  PLT 105*  --  152 158 207  < > = values in this interval not displayed.  Dg Chest Portable 1 View  08/17/2015  CLINICAL DATA:  Shortness of breath. Pneumonia. Subsequent encounter. EXAM: PORTABLE CHEST 1 VIEW COMPARISON:  Single view of the chest 07/26/2015. FINDINGS: Endotracheal tube and NG tube been removed. Small right pleural effusion is decreased in size since the comparison study. There is streaky right basilar airspace disease. Mild left basilar atelectasis is unchanged. The lungs are markedly emphysematous. Scar in the left upper lobe is not well demonstrated due to overlying tubes. No pneumothorax. Heart size normal. IMPRESSION: Decreased right  pleural effusion with persistent streaky right basilar airspace opacity which is worrisome for pneumonia. Emphysema. Electronically Signed   By: Inge Rise M.D.   On: 08/17/2015 11:17      Medications: I have reviewed the patient's current medications.  Impression: 1. Acute respiratory failure with hypoxia. This is secondary to COPD and probable underlying pneumonia. Continue with intravenous antibiotics and steroids, reduced frequency. 2. Diabetes. 3. Acute renal failure. This has improved with some IV fluids. 4. Elevated liver enzymes. Etiology of this is unclear. I'm going to obtain ultrasound of the liver and we will monitor the liver enzymes closely. 5. Hypokalemia. Replete.       Consultants:  None.   Procedures:  None.                      Code Status: Full code.  Family Communication: I discussed the plan with the patient at the bedside.   Disposition Plan: Home when medically stable.  Time spent: 20 minutes.   LOS: 2 days   [place] C   08/19/2015, 7:28 AM

## 2015-08-19 NOTE — Progress Notes (Signed)
Pharmacy Antibiotic Note  Ryan [name] is a 55 y.o. male admitted on 08/17/2015 with pneumonia.  Pharmacy has been consulted for vancomycin and renal dose antibiotics.   His vanc trough today is 24 mg/L   T1/2 ~ 11.2 hours  Plan: Change vancomycin to 750 mg IV q12 hours Cont cefepime as ordered  Height: '5\' 8"'$  (172.7 cm) Weight: 156 lb 4.9 oz (70.9 kg) IBW/kg (Calculated) : 68.4  Temp (24hrs), Avg:97.5 F (36.4 C), Min:97 F (36.1 C), Max:97.8 F (36.6 C)   Recent Labs Lab 08/17/15 1110 08/17/15 1118 08/17/15 1119 08/17/15 1157 08/18/15 0533 08/19/15 0037 08/19/15 0514 08/19/15 0817  WBC 3.4*  --   --  4.8 3.6*  --  5.9  --   CREATININE  --  0.90  --  1.23 1.46*  --  1.28*  --   LATICACIDVEN  --   --  3.09*  --   --   --   --   --   VANCOTROUGH  --   --   --   --   --  43*  --  24*    Estimated Creatinine Clearance: 62.3 mL/min (by C-G formula based on Cr of 1.28).    Allergies  Allergen Reactions  . Albuterol Palpitations  . Influenza Vaccine Live Swelling    Antimicrobials this admission: He was on vancomycin 1250 mg IV q12 hours PTA for MRSA bacteremia with an expected stop date of 08/23/15.  Cefepime dose appropriate for renal function Cefepime 2/3 >>   Microbiology results: Previously being treated for MRSA bacteremia BC and sputum cultures are neg to date  Thank you for allowing pharmacy to be a part of this patient's care.  Excell Seltzer Poteet 08/19/2015 9:15 AM

## 2015-08-19 NOTE — Progress Notes (Signed)
White PICC port flushes well.  Gray PICC port still not flushing after cathflo.

## 2015-08-19 NOTE — Progress Notes (Signed)
Gray port instilled with cathflo for 1/2 hour, now flushes well

## 2015-08-20 ENCOUNTER — Inpatient Hospital Stay (HOSPITAL_COMMUNITY): Payer: Medicare Other

## 2015-08-20 ENCOUNTER — Encounter (HOSPITAL_COMMUNITY): Payer: Self-pay | Admitting: Gastroenterology

## 2015-08-20 DIAGNOSIS — D649 Anemia, unspecified: Secondary | ICD-10-CM

## 2015-08-20 DIAGNOSIS — R748 Abnormal levels of other serum enzymes: Secondary | ICD-10-CM | POA: Diagnosis present

## 2015-08-20 DIAGNOSIS — J9601 Acute respiratory failure with hypoxia: Secondary | ICD-10-CM

## 2015-08-20 DIAGNOSIS — D509 Iron deficiency anemia, unspecified: Secondary | ICD-10-CM | POA: Insufficient documentation

## 2015-08-20 LAB — HEPATIC FUNCTION PANEL
ALK PHOS: 75 U/L (ref 38–126)
ALT: 234 U/L — AB (ref 17–63)
AST: 63 U/L — ABNORMAL HIGH (ref 15–41)
Albumin: 2.3 g/dL — ABNORMAL LOW (ref 3.5–5.0)
BILIRUBIN DIRECT: 0.1 mg/dL (ref 0.1–0.5)
BILIRUBIN TOTAL: 0.3 mg/dL (ref 0.3–1.2)
Indirect Bilirubin: 0.2 mg/dL — ABNORMAL LOW (ref 0.3–0.9)
TOTAL PROTEIN: 5.8 g/dL — AB (ref 6.5–8.1)

## 2015-08-20 LAB — GLUCOSE, CAPILLARY
GLUCOSE-CAPILLARY: 267 mg/dL — AB (ref 65–99)
Glucose-Capillary: 370 mg/dL — ABNORMAL HIGH (ref 65–99)
Glucose-Capillary: 121 mg/dL — ABNORMAL HIGH (ref 65–99)
Glucose-Capillary: 404 mg/dL — ABNORMAL HIGH (ref 65–99)

## 2015-08-20 LAB — BASIC METABOLIC PANEL
Anion gap: 9 (ref 5–15)
BUN: 35 mg/dL — AB (ref 6–20)
CALCIUM: 7.7 mg/dL — AB (ref 8.9–10.3)
CHLORIDE: 99 mmol/L — AB (ref 101–111)
CO2: 31 mmol/L (ref 22–32)
CREATININE: 1.24 mg/dL (ref 0.61–1.24)
GFR calc Af Amer: >60 mL/min (ref >60)
GFR calc non Af Amer: >60 mL/min (ref >60)
Glucose, Bld: 386 mg/dL — ABNORMAL HIGH (ref 65–99)
Potassium: 3.4 mmol/L — ABNORMAL LOW (ref 3.5–5.1)
SODIUM: 139 mmol/L (ref 135–145)

## 2015-08-20 LAB — PROTIME-INR
INR: 1.26 (ref 0.00–1.49)
Prothrombin Time: 16 seconds — ABNORMAL HIGH (ref 11.6–15.2)

## 2015-08-20 MED ORDER — INSULIN ASPART 100 UNIT/ML ~~LOC~~ SOLN
5.0000 [IU] | Freq: Three times a day (TID) | SUBCUTANEOUS | Status: DC
  Administered 2015-08-20 – 2015-08-21 (×3): 5 [IU] via SUBCUTANEOUS

## 2015-08-20 MED ORDER — INSULIN GLARGINE 100 UNIT/ML ~~LOC~~ SOLN
30.0000 [IU] | Freq: Every day | SUBCUTANEOUS | Status: DC
  Administered 2015-08-20: 30 [IU] via SUBCUTANEOUS
  Filled 2015-08-20 (×2): qty 0.3

## 2015-08-20 MED ORDER — INSULIN ASPART 100 UNIT/ML ~~LOC~~ SOLN
10.0000 [IU] | Freq: Once | SUBCUTANEOUS | Status: AC
  Administered 2015-08-20: 10 [IU] via SUBCUTANEOUS

## 2015-08-20 NOTE — Progress Notes (Signed)
Inpatient Diabetes Program Recommendations  AACE/ADA: New Consensus Statement on Inpatient Glycemic Control (2015)  Target Ranges:  Prepandial:   less than 140 mg/dL      Peak postprandial:   less than 180 mg/dL (1-2 hours)      Critically ill patients:  140 - 180 mg/dL   Results for Ryan [name], Ryan [name] (MRN [id number]) as of 08/20/2015 08:16  Ref. Range 08/19/2015 07:18 08/19/2015 11:41 08/19/2015 16:17 08/19/2015 21:43 08/20/2015 07:35  Glucose-Capillary Latest Ref Range: 65-99 mg/dL 365 (H) 354 (H) 347 (H) 321 (H) 370 (H)   Review of Glycemic Control  Diabetes history: DM2 Outpatient Diabetes medications: Metformin 500 mg QAM, Novolog 0-20 units TID with meals Current orders for Inpatient glycemic control: Lantus 20 units daily, Novolog 0-20 units TID with meals, Novolog 0-5 units QHS, Metformin 500 mg QAM  Inpatient Diabetes Program Recommendations: Insulin - Basal: If steroids are continued as ordered (Solumedrol 60 mg Q12H), please consider increasing Lantus to 30 units daily (based on 73.8 x 0.4 units). Insulin - Meal Coverage: If steroids are continued as ordered, please consider ordering Novolog 8 units TID with meals for meal coverage.   Thanks, Barnie Alderman, RN, MSN, CDE Diabetes Coordinator Inpatient Diabetes Program [phone] (Team Pager from [place] to [place]) [phone] ([facility] office) [phone] [facility] office) [phone] [facility] office)

## 2015-08-20 NOTE — Progress Notes (Addendum)
Subjective: He was admitted with probable pneumonia and acute on chronic respiratory failure. He is being treated for MRSA bacteremia. He has severe COPD at baseline with some element of pulmonary fibrosis. He has chronic atrial fibrillation on chronic anticoagulation. He says he feels a little better. He is still coughing up sputum but it has lightened up a little bit. His blood sugar has been high  Objective: Vital signs in last 24 hours: Temp:  [96.9 F (36.1 C)-98.1 F (36.7 C)] 96.9 F (36.1 C) (02/06 0730) Pulse Rate:  [50-108] 78 (02/06 0400) Resp:  [13-26] 20 (02/06 0400) BP: (110-147)/(64-89) 131/78 mmHg (02/06 0400) SpO2:  [91 %-100 %] 94 % (02/06 0730) Weight:  [73.8 kg (162 lb 11.2 oz)] 73.8 kg (162 lb 11.2 oz) (02/06 0500) Weight change: 2.9 kg (6 lb 6.3 oz) Last BM Date: 08/19/15  Intake/Output from previous day: 02/05 0701 - 02/06 0700 In: 1950 [I.V.:1650; IV Piggyback:300] Out: 1550 [Urine:1550]  PHYSICAL EXAM General appearance: alert, cooperative and mild distress Resp: rhonchi bilaterally Cardio: irregularly irregular rhythm GI: soft, non-tender; bowel sounds normal; no masses,  no organomegaly Extremities: extremities normal, atraumatic, no cyanosis or edema  Lab Results:  Results for orders placed or performed during the hospital encounter of 08/17/15 (from the past 48 hour(s))  Glucose, capillary     Status: Abnormal   Collection Time: 08/18/15 11:51 AM  Result Value Ref Range   Glucose-Capillary 355 (H) 65 - 99 mg/dL   Comment 1 Notify RN    Comment 2 Document in Chart   Strep pneumoniae urinary antigen     Status: None   Collection Time: 08/18/15  4:16 PM  Result Value Ref Range   Strep Pneumo Urinary Antigen NEGATIVE NEGATIVE    Comment:        Infection due to S. pneumoniae cannot be absolutely ruled out since the antigen present may be below the detection limit of the test. Performed at  [facility]   Culture, respiratory  (NON-Expectorated)     Status: None (Preliminary result)   Collection Time: 08/18/15  4:16 PM  Result Value Ref Range   Specimen Description SPUTUM EXPECTORATED    Special Requests NONE    Gram Stain      RARE WBC PRESENT, PREDOMINANTLY PMN FEW SQUAMOUS EPITHELIAL CELLS PRESENT RARE GRAM POSITIVE COCCI IN PAIRS Performed at [facility]    Culture PENDING    Report Status PENDING   Glucose, capillary     Status: Abnormal   Collection Time: 08/18/15  4:51 PM  Result Value Ref Range   Glucose-Capillary 292 (H) 65 - 99 mg/dL   Comment 1 Notify RN    Comment 2 Document in Chart   Glucose, capillary     Status: Abnormal   Collection Time: 08/18/15  9:11 PM  Result Value Ref Range   Glucose-Capillary 459 (H) 65 - 99 mg/dL   Comment 1 Notify RN   Vancomycin, trough     Status: Abnormal   Collection Time: 08/19/15 12:37 AM  Result Value Ref Range   Vancomycin Tr 43 (HH) 10.0 - 20.0 ug/mL    Comment: CRITICAL RESULT CALLED TO, READ BACK BY AND VERIFIED WITH:  HEARN,J @ 0121 ON 08/19/15 BY WOODIE,J   CBC     Status: Abnormal   Collection Time: 08/19/15  5:14 AM  Result Value Ref Range   WBC 5.9 4.0 - 10.5 K/uL   RBC 3.02 (L) 4.22 - 5.81 MIL/uL   Hemoglobin 7.1 (L)  13.0 - 17.0 g/dL   HCT 23.0 (L) 39.0 - 52.0 %   MCV 76.2 (L) 78.0 - 100.0 fL   MCH 23.5 (L) 26.0 - 34.0 pg   MCHC 30.9 30.0 - 36.0 g/dL   RDW 21.5 (H) 11.5 - 15.5 %   Platelets 207 150 - 400 K/uL  Comprehensive metabolic panel     Status: Abnormal   Collection Time: 08/19/15  5:14 AM  Result Value Ref Range   Sodium 136 135 - 145 mmol/L   Potassium 3.1 (L) 3.5 - 5.1 mmol/L    Comment: DELTA CHECK NOTED   Chloride 96 (L) 101 - 111 mmol/L   CO2 29 22 - 32 mmol/L   Glucose, Bld 340 (H) 65 - 99 mg/dL   BUN 33 (H) 6 - 20 mg/dL   Creatinine, Ser 1.28 (H) 0.61 - 1.24 mg/dL   Calcium 7.9 (L) 8.9 - 10.3 mg/dL   Total Protein 5.9 (L) 6.5 - 8.1 g/dL   Albumin 2.3 (L) 3.5 - 5.0 g/dL   AST 141 (H) 15 - 41 U/L   ALT  301 (H) 17 - 63 U/L   Alkaline Phosphatase 88 38 - 126 U/L   Total Bilirubin 0.4 0.3 - 1.2 mg/dL   GFR calc non Af Amer >60 >60 mL/min   GFR calc Af Amer >60 >60 mL/min    Comment: (NOTE) The eGFR has been calculated using the CKD EPI equation. This calculation has not been validated in all clinical situations. eGFR's persistently <60 mL/min signify possible Chronic Kidney Disease.    Anion gap 11 5 - 15  Glucose, capillary     Status: Abnormal   Collection Time: 08/19/15  7:18 AM  Result Value Ref Range   Glucose-Capillary 365 (H) 65 - 99 mg/dL  Vancomycin, trough     Status: Abnormal   Collection Time: 08/19/15  8:17 AM  Result Value Ref Range   Vancomycin Tr 24 (H) 10.0 - 20.0 ug/mL  Glucose, capillary     Status: Abnormal   Collection Time: 08/19/15 11:41 AM  Result Value Ref Range   Glucose-Capillary 354 (H) 65 - 99 mg/dL   Comment 1 Notify RN    Comment 2 Document in Chart   Glucose, capillary     Status: Abnormal   Collection Time: 08/19/15  4:17 PM  Result Value Ref Range   Glucose-Capillary 347 (H) 65 - 99 mg/dL   Comment 1 Notify RN    Comment 2 Document in Chart   Glucose, capillary     Status: Abnormal   Collection Time: 08/19/15  9:43 PM  Result Value Ref Range   Glucose-Capillary 321 (H) 65 - 99 mg/dL   Comment 1 Notify RN   Basic metabolic panel     Status: Abnormal   Collection Time: 08/20/15  5:21 AM  Result Value Ref Range   Sodium 139 135 - 145 mmol/L   Potassium 3.4 (L) 3.5 - 5.1 mmol/L   Chloride 99 (L) 101 - 111 mmol/L   CO2 31 22 - 32 mmol/L   Glucose, Bld 386 (H) 65 - 99 mg/dL   BUN 35 (H) 6 - 20 mg/dL   Creatinine, Ser 1.24 0.61 - 1.24 mg/dL   Calcium 7.7 (L) 8.9 - 10.3 mg/dL   GFR calc non Af Amer >60 >60 mL/min   GFR calc Af Amer >60 >60 mL/min    Comment: (NOTE) The eGFR has been calculated using the CKD EPI equation. This calculation has not  been validated in all clinical situations. eGFR's persistently <60 mL/min signify possible  Chronic Kidney Disease.    Anion gap 9 5 - 15  Hepatic function panel     Status: Abnormal   Collection Time: 08/20/15  5:21 AM  Result Value Ref Range   Total Protein 5.8 (L) 6.5 - 8.1 g/dL   Albumin 2.3 (L) 3.5 - 5.0 g/dL   AST 63 (H) 15 - 41 U/L   ALT 234 (H) 17 - 63 U/L   Alkaline Phosphatase 75 38 - 126 U/L   Total Bilirubin 0.3 0.3 - 1.2 mg/dL   Bilirubin, Direct 0.1 0.1 - 0.5 mg/dL   Indirect Bilirubin 0.2 (L) 0.3 - 0.9 mg/dL  Protime-INR     Status: Abnormal   Collection Time: 08/20/15  5:21 AM  Result Value Ref Range   Prothrombin Time 16.0 (H) 11.6 - 15.2 seconds   INR 1.26 0.00 - 1.49  Glucose, capillary     Status: Abnormal   Collection Time: 08/20/15  7:35 AM  Result Value Ref Range   Glucose-Capillary 370 (H) 65 - 99 mg/dL   Comment 1 Notify RN    Comment 2 Document in Chart     ABGS  Recent Labs  08/17/15 1118  TCO2 23   CULTURES Recent Results (from the past 240 hour(s))  Culture, blood (routine x 2) Call MD if unable to obtain prior to antibiotics being given     Status: None (Preliminary result)   Collection Time: 08/17/15  2:21 PM  Result Value Ref Range Status   Specimen Description RIGHT ANTECUBITAL  Final   Special Requests BOTTLES DRAWN AEROBIC AND ANAEROBIC 6CC  Final   Culture NO GROWTH 2 DAYS  Final   Report Status PENDING  Incomplete  Culture, blood (routine x 2) Call MD if unable to obtain prior to antibiotics being given     Status: None (Preliminary result)   Collection Time: 08/17/15  2:28 PM  Result Value Ref Range Status   Specimen Description BLOOD LEFT HAND  Final   Special Requests BOTTLES DRAWN AEROBIC AND ANAEROBIC 6CC  Final   Culture NO GROWTH 2 DAYS  Final   Report Status PENDING  Incomplete  MRSA PCR Screening     Status: None   Collection Time: 08/17/15  7:00 PM  Result Value Ref Range Status   MRSA by PCR NEGATIVE NEGATIVE Final    Comment:        The GeneXpert MRSA Assay (FDA approved for NASAL specimens only), is  one component of a comprehensive MRSA colonization surveillance program. It is not intended to diagnose MRSA infection nor to guide or monitor treatment for MRSA infections.   Culture, respiratory (NON-Expectorated)     Status: None (Preliminary result)   Collection Time: 08/18/15  4:16 PM  Result Value Ref Range Status   Specimen Description SPUTUM EXPECTORATED  Final   Special Requests NONE  Final   Gram Stain   Final    RARE WBC PRESENT, PREDOMINANTLY PMN FEW SQUAMOUS EPITHELIAL CELLS PRESENT RARE GRAM POSITIVE COCCI IN PAIRS Performed at [facility]    Culture PENDING  Incomplete   Report Status PENDING  Incomplete   Studies/Results: No results found.  Medications:  Prior to Admission:  Prescriptions prior to admission  Medication Sig Dispense Refill Last Dose  . albuterol (VENTOLIN HFA) 108 (90 BASE) MCG/ACT inhaler Inhale 2 puffs into the lungs every 6 (six) hours as needed for wheezing or shortness of breath.  Past Week at Unknown time  . baclofen (LIORESAL) 10 MG tablet Take 10 mg by mouth 3 (three) times daily.    08/17/2015 at Unknown time  . benzonatate (TESSALON) 200 MG capsule Take 200 mg by mouth 3 (three) times daily.   08/17/2015 at Unknown time  . Dextromethorphan-Guaifenesin 20-400 MG TABS Take 1 tablet by mouth every morning.    08/16/2015 at Unknown time  . diltiazem (CARDIZEM CD) 120 MG 24 hr capsule Take 1 capsule (120 mg total) by mouth daily. 30 capsule 11 08/17/2015 at Unknown time  . diphenhydrAMINE (BENADRYL) 25 MG tablet Take 50 mg by mouth every morning.   08/17/2015 at Unknown time  . edoxaban (SAVAYSA) 60 MG TABS tablet Take 60 mg by mouth daily. 30 tablet 11 08/17/2015 at Unknown time  . fluticasone (FLONASE) 50 MCG/ACT nasal spray Place 2 sprays into both nostrils daily as needed for allergies.    Past Month at Unknown time  . furosemide (LASIX) 40 MG tablet Take 40 mg by mouth 2 (two) times daily.   08/17/2015 at Unknown time  . insulin aspart  (NOVOLOG) 100 UNIT/ML injection Inject 0-20 Units into the skin 3 (three) times daily with meals. 10 mL 11 08/17/2015 at Unknown time  . levalbuterol (XOPENEX) 0.63 MG/3ML nebulizer solution Take 3 mLs (0.63 mg total) by nebulization 3 (three) times daily. (Patient taking differently: Take 0.63 mg by nebulization every 8 (eight) hours as needed for wheezing or shortness of breath. ) 3 mL 12 Past Week at Unknown time  . LORazepam (ATIVAN) 0.5 MG tablet Take 1 tablet (0.5 mg total) by mouth 2 (two) times daily as needed for anxiety or sleep. (Patient taking differently: Take 0.5 mg by mouth 4 (four) times daily as needed for anxiety or sleep. ) 20 tablet 0 08/17/2015 at Unknown time  . Magnesium 500 MG TABS Take 500 mg by mouth daily.   08/17/2015 at Unknown time  . metFORMIN (GLUCOPHAGE) 500 MG tablet Take 1 tablet (500 mg total) by mouth daily with breakfast. 30 tablet 3 08/17/2015 at Unknown time  . montelukast (SINGULAIR) 10 MG tablet Take 10 mg by mouth at bedtime.   08/16/2015 at Unknown time  . nicotine (NICODERM CQ - DOSED IN MG/24 HOURS) 14 mg/24hr patch Place 14 mg onto the skin daily.   08/16/2015 at Unknown time  . nitroGLYCERIN (NITROSTAT) 0.4 MG SL tablet Place 1 tablet (0.4 mg total) under the tongue every 5 (five) minutes as needed for chest pain. 25 tablet 4 unknown  . oxyCODONE-acetaminophen (PERCOCET) 10-325 MG tablet Take 1 tablet by mouth every 4 (four) hours as needed for pain.   08/16/2015 at Unknown time  . pantoprazole (PROTONIX) 40 MG tablet Take 1 tablet (40 mg total) by mouth 2 (two) times daily. 60 tablet 0 08/17/2015 at Unknown time  . potassium chloride SA (K-DUR,KLOR-CON) 20 MEQ tablet Take 20 mEq by mouth 2 (two) times daily.   08/17/2015 at Unknown time  . SPIRIVA RESPIMAT 2.5 MCG/ACT AERS Inhale 1 puff into the lungs daily.   08/17/2015 at Unknown time  . vancomycin in sodium chloride 0.9 % 250 mL Inject 1,250 mg into the vein every 12 (twelve) hours. Approximate time of 75 minutes per  infusion   08/17/2015 at 830a  . nicotine (NICODERM CQ - DOSED IN MG/24 HOURS) 21 mg/24hr patch Place 1 patch (21 mg total) onto the skin daily. (Patient not taking: Reported on 08/17/2015) 28 patch 0 Taking  . PHARMACIST CHOICE LANCETS  MISC USE 2 TIMES DAILY FOR DIABETIC TESTING 200 each 0   . predniSONE (DELTASONE) 10 MG tablet Take 4 tablets (40 mg total) by mouth daily with breakfast. (Patient not taking: Reported on 08/17/2015) 30 tablet 1 Completed Course at Unknown time   Scheduled: . sodium chloride   Intravenous Once  . baclofen  10 mg Oral TID  . benzonatate  200 mg Oral TID  . ceFEPime (MAXIPIME) IV  1 g Intravenous 3 times per day  . diltiazem  120 mg Oral Daily  . edoxaban  60 mg Oral Daily  . furosemide  40 mg Oral BID  . insulin aspart  0-20 Units Subcutaneous TID WC  . insulin aspart  0-5 Units Subcutaneous QHS  . insulin glargine  20 Units Subcutaneous Daily  . levalbuterol  0.63 mg Nebulization Q6H  . metFORMIN  500 mg Oral Q breakfast  . methylPREDNISolone (SOLU-MEDROL) injection  60 mg Intravenous Q12H  . nicotine  14 mg Transdermal Daily  . oxyCODONE-acetaminophen  1 tablet Oral Q4H  . pantoprazole  40 mg Oral Daily  . sodium chloride flush  3 mL Intravenous Q12H  . vancomycin  750 mg Intravenous Q12H   Continuous: . sodium chloride 75 mL/hr at 08/20/15 0433   OHY:[id number] **OR** acetaminophen, alum & mag hydroxide-simeth, levalbuterol, LORazepam  Assesment: He was admitted with acute hypoxic respiratory failure. He has what appears to be pneumonia. He is iron deficient but his stool was negative. He is definitely anemic. He is on anticoagulation for atrial fibrillation. His stool was negative for blood but iron studies show iron deficiency anemia. His blood sugar is elevated. He has elevated liver function testing also. He has swelling of both feet and is being getting IV fluids because of acute kidney injury. His cardiac function was actually pretty good so I'm  going to have him get an ultrasound of his legs to make sure he has not developed some sort of blood clot even though he is on full anticoagulation Principal Problem:   Acute respiratory failure with hypoxia ([place]) Active Problems:   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  quit smoking 05/01/15    Type 2 diabetes mellitus without complication (HCC)   Atrial fibrillation with rapid ventricular response (HCC)   Chronic respiratory failure with hypoxia (HCC)   COPD (chronic obstructive pulmonary disease) ([place])    Plan: Continue current treatments. I will request GI consultation. Alter his insulin. No other new medications.    LOS: 3 days   Tymon Nemetz L 08/20/2015, 8:23 AM

## 2015-08-20 NOTE — Care Management Note (Signed)
Case Management Note  Patient Details  Name: EMRIK ERHARD MRN: [id number] Date of Birth: 1961/06/09  Subjective/Objective:                  Pt is from home, lives with wife and has [facility] nursing services through [facility]. Pt receiving IV abx prior to admission. Romualdo Bolk, of [facility], made aware of referral and will obtain pt info from chart. Pt has home O2 prior to admission. Pt plans to return home with resumption of HH and IV abx at DC.   Action/Plan: Will cont to follow.   Expected Discharge Date:      08/23/2015            Expected Discharge Plan:  [place]  In-House Referral:  NA  Discharge planning Services  CM Consult  Post Acute Care Choice:  Resumption of Svcs/PTA Provider Choice offered to:  Patient  DME Arranged:    DME Agency:     HH Arranged:  RN [place] Agency:  [place]  Status of Service:  In process, will continue to follow  Medicare Important Message Given:    Date Medicare IM Given:    Medicare IM give by:    Date Additional Medicare IM Given:    Additional Medicare Important Message give by:     If discussed at [place] of Stay Meetings, dates discussed:    Additional Comments:  Sherald Barge, RN 08/20/2015, 4:03 PM

## 2015-08-20 NOTE — Consult Note (Signed)
error 

## 2015-08-20 NOTE — Consult Note (Signed)
Referring Provider: Dr. Luan Pulling  Primary Care Physician: Alonza Bogus, MD Primary Gastroenterologist: Dr. Michail Sermon (performed EGD in Oct 2016 while inpatient). Dr. Gala Romney this hospitalization.   Date of Admission: 08/17/15 Date of Consultation: 08/20/15  Reason for Consultation: IDA   HPI:  Ryan [name] is a 55 y.o. year old male with a history of COPD, presenting with increasing shortness of breath and hypoxia. O2 sats were 70% in the field prior to arrival. Findings of questionable pneumonia on chest xray. History of MRSA bacteremia in Jan 2017, requiring prolonged admission. GI has been consulted due to IDA.   Denies any overt GI bleeding to include melena, hematochezia, hematemesis. Hemoccult negative in ED. Very rare nausea. Has felt light-headed and dizzy. Notes chronic GERD, on Protonix BID as outpatient. No dysphagia. No abdominal pain. Denies diarrhea, constipation. Denies any NSAIDs or aspirin powders. However, he is chronically anticoagulated for afib with Savaysa.   He denies any weight loss. Has a good appetite. Notes lower extremity pitting edema and feels like his abdomen is distended as well. He recently had an EGD by Dr. Michail Sermon in Oct 2016 with minimal erosive esophagitis and small hiatal hernia. Last colonoscopy at [facility] "several years ago", normal per patient. Records not available at time of consultation.   Elevated transaminases noted, in the 200s range, improving. Prior LFTs in Oct 2016 normal. Appears elevations started in Jan 2017. No known prior liver disease. US abdomen with suspected hepatic steatosis. No ascites on ultrasound after review with Dr. Thornton Papas. Hgb 5.7 on admission, with improvement to 7 range after 2 units PRBCs. Holding steady now. Last month, Hgb in the 10 range. Appears Oct 2016 marks when Hgb started drifting, as previously it had remained overall normal. Ferritin 307. Iron low at 42.     Past Medical History  Diagnosis Date  . COPD  (chronic obstructive pulmonary disease) ([place])   . Hypercholesteremia   . History of pneumonia   . Polycythemia   . Coronary atherosclerosis of native coronary artery     Mild nonobstructive disease at catheterization 2007  . 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 Borderline diabetes   . Seizures ([place])     Last seizure 2 yrs ago  . GERD (gastroesophageal reflux disease)   . Type 2 diabetes mellitus ([place])   . DDD (degenerative disc disease)     Cervical and thoracic  . Chronic back pain   . Bronchitis   . Chronic left shoulder pain   . Chronic neck pain   . Atrial fibrillation ([place])     Not anticoagulated  . Smoker   . Type 2 diabetes mellitus without complication ([place])   . CAP (community acquired pneumonia) 07/10/2013    04/2015  . Collagen vascular disease ([place])   . Chronic respiratory failure [facility])     Past Surgical History  Procedure Laterality Date  . Colonoscopy      at [facility] "several years" ago per patient and wife. Requesting records   . Vasectomy    . Throat biopsy    . Lung biopsy    . Vasectomy  1987  . Esophagogastroduodenoscopy N/A 05/04/2015    Dr. Michail Sermon: minimal erosive esophagitis, small hiatal hernia    Prior to Admission medications   Medication Sig Start Date End Date Taking? Authorizing Provider  albuterol (VENTOLIN HFA) 108 (90 BASE) MCG/ACT inhaler Inhale 2 puffs into the lungs every 6 (six) hours as needed for wheezing or  shortness of breath.   Yes Historical Provider, MD  baclofen (LIORESAL) 10 MG tablet Take 10 mg by mouth 3 (three) times daily.  06/05/15  Yes Historical Provider, MD  benzonatate (TESSALON) 200 MG capsule Take 200 mg by mouth 3 (three) times daily.   Yes Historical Provider, MD  Dextromethorphan-Guaifenesin 20-400 MG TABS Take 1 tablet by mouth  every morning.    Yes Historical Provider, MD  diltiazem (CARDIZEM CD) 120 MG 24 hr capsule Take 1 capsule (120 mg total) by mouth daily. 05/11/15  Yes Lendon Colonel, NP  diphenhydrAMINE (BENADRYL) 25 MG tablet Take 50 mg by mouth every morning.   Yes Historical Provider, MD  edoxaban (SAVAYSA) 60 MG TABS tablet Take 60 mg by mouth daily. 05/11/15  Yes Lendon Colonel, NP  fluticasone (FLONASE) 50 MCG/ACT nasal spray Place 2 sprays into both nostrils daily as needed for allergies.    Yes Historical Provider, MD  furosemide (LASIX) 40 MG tablet Take 40 mg by mouth 2 (two) times daily.   Yes Historical Provider, MD  insulin aspart (NOVOLOG) 100 UNIT/ML injection Inject 0-20 Units into the skin 3 (three) times daily with meals. 08/01/15  Yes [name], MD  levalbuterol Penne Lash) 0.63 MG/3ML nebulizer solution Take 3 mLs (0.63 mg total) by nebulization 3 (three) times daily. Patient taking differently: Take 0.63 mg by nebulization every 8 (eight) hours as needed for wheezing or shortness of breath.  09/15/14  Yes Rexene Alberts, MD  LORazepam (ATIVAN) 0.5 MG tablet Take 1 tablet (0.5 mg total) by mouth 2 (two) times daily as needed for anxiety or sleep. Patient taking differently: Take 0.5 mg by mouth 4 (four) times daily as needed for anxiety or sleep.  05/04/15  Yes Maryann Mikhail, DO  Magnesium 500 MG TABS Take 500 mg by mouth daily.   Yes Historical Provider, MD  metFORMIN (GLUCOPHAGE) 500 MG tablet Take 1 tablet (500 mg total) by mouth daily with breakfast. 09/15/14  Yes Rexene Alberts, MD  montelukast (SINGULAIR) 10 MG tablet Take 10 mg by mouth at bedtime.   Yes Historical Provider, MD  nicotine (NICODERM CQ - DOSED IN MG/24 HOURS) 14 mg/24hr patch Place 14 mg onto the skin daily.   Yes Historical Provider, MD  nitroGLYCERIN (NITROSTAT) 0.4 MG SL tablet Place 1 tablet (0.4 mg total) under the tongue every 5 (five)  minutes as needed for chest pain. 04/22/13  Yes Lendon Colonel, NP  oxyCODONE-acetaminophen (PERCOCET) 10-325 MG tablet Take 1 tablet by mouth every 4 (four) hours as needed for pain.   Yes Historical Provider, MD  pantoprazole (PROTONIX) 40 MG tablet Take 1 tablet (40 mg total) by mouth 2 (two) times daily. 05/05/15  Yes Maryann Mikhail, DO  potassium chloride SA (K-DUR,KLOR-CON) 20 MEQ tablet Take 20 mEq by mouth 2 (two) times daily.   Yes Historical Provider, MD  SPIRIVA RESPIMAT 2.5 MCG/ACT AERS Inhale 1 puff into the lungs daily. 06/20/15  Yes Historical Provider, MD  vancomycin in sodium chloride 0.9 % 250 mL Inject 1,250 mg into the vein every 12 (twelve) hours. Approximate time of 75 minutes per infusion   Yes Historical Provider, MD  nicotine (NICODERM CQ - DOSED IN MG/24 HOURS) 21 mg/24hr patch Place 1 patch (21 mg total) onto the skin daily. Patient not taking: Reported on 08/17/2015 05/05/15   Cristal Ford, DO  PHARMACIST CHOICE LANCETS MISC USE 2 TIMES DAILY FOR DIABETIC TESTING 08/08/15   Orlena Sheldon, PA-C  predniSONE (DELTASONE) 10 MG  tablet Take 4 tablets (40 mg total) by mouth daily with breakfast. Patient not taking: Reported on 08/17/2015 08/01/15   [name], MD    Current Facility-Administered Medications  Medication Dose Route Frequency Provider Last Rate Last Dose  . 0.9 % sodium chloride infusion  Intravenous Once Milton Ferguson, MD    . 0.9 % sodium chloride infusion  Intravenous Continuous Doree Albee, MD 75 mL/hr at 08/20/15 0433   . acetaminophen (TYLENOL) tablet 650 mg 650 mg Oral Q6H PRN Donne Hazel, MD     Or  . acetaminophen (TYLENOL) suppository 650 mg 650 mg Rectal Q6H PRN Donne Hazel, MD    . alum & mag hydroxide-simeth (MAALOX/MYLANTA) 200-200-20 MG/5ML suspension 30 mL 30 mL Oral Q6H PRN Nimish Luther Parody, MD    . baclofen  (LIORESAL) tablet 10 mg 10 mg Oral TID Donne Hazel, MD  10 mg at 08/20/15 1032  . benzonatate (TESSALON) capsule 200 mg 200 mg Oral TID Donne Hazel, MD  200 mg at 08/20/15 1033  . ceFEPIme (MAXIPIME) 1 g in dextrose 5 % 50 mL IVPB 1 g Intravenous 3 times per day Donne Hazel, MD  1 g at 08/20/15 [id number]  . diltiazem (CARDIZEM CD) 24 hr capsule 120 mg 120 mg Oral Daily Donne Hazel, MD  120 mg at 08/20/15 1041  . edoxaban (SAVAYSA) tablet 60 mg 60 mg Oral Daily Donne Hazel, MD  60 mg at 08/20/15 1032  . furosemide (LASIX) tablet 40 mg 40 mg Oral BID Donne Hazel, MD  40 mg at 08/20/15 0820  . insulin aspart (novoLOG) injection 0-20 Units 0-20 Units Subcutaneous TID WC Doree Albee, MD  11 Units at 08/20/15 1223  . insulin aspart (novoLOG) injection 0-5 Units 0-5 Units Subcutaneous QHS Doree Albee, MD  4 Units at 08/19/15 2203  . insulin aspart (novoLOG) injection 5 Units 5 Units Subcutaneous TID WC [name], MD  5 Units at 08/20/15 1222  . insulin glargine (LANTUS) injection 30 Units 30 Units Subcutaneous Daily [name], MD  30 Units at 08/20/15 1023  . levalbuterol (XOPENEX) nebulizer solution 0.63 mg 0.63 mg Nebulization Q6H [name], MD  0.63 mg at 08/20/15 0730  . levalbuterol (XOPENEX) nebulizer solution 0.63 mg 0.63 mg Nebulization Q3H PRN [name], MD  0.63 mg at 08/20/15 0410  . LORazepam (ATIVAN) tablet 0.5 mg 0.5 mg Oral Q4H PRN Doree Albee, MD  0.5 mg at 08/20/15 0721  . metFORMIN (GLUCOPHAGE) tablet 500 mg 500 mg Oral Q breakfast Donne Hazel, MD  500 mg at 08/20/15 0820  . methylPREDNISolone sodium succinate (SOLU-MEDROL) 125 mg/2 mL injection 60 mg 60 mg Intravenous Q12H Nimish C Anastasio Champion, MD  60 mg at 08/20/15 0256  . nicotine (NICODERM CQ - dosed in mg/24 hours) patch 14 mg 14 mg  Transdermal Daily Nimish C Gosrani, MD  14 mg at 08/20/15 1027  . oxyCODONE-acetaminophen (PERCOCET/ROXICET) 5-325 MG per tablet 1 tablet 1 tablet Oral Q4H Doree Albee, MD  1 tablet at 08/20/15 0716  . pantoprazole (PROTONIX) EC tablet 40 mg 40 mg Oral Daily Nimish C Anastasio Champion, MD  40 mg at 08/20/15 1040  . sodium chloride flush (NS) 0.9 % injection 3 mL 3 mL Intravenous Q12H Donne Hazel, MD  3 mL at 08/20/15 1053  . vancomycin (VANCOCIN) IVPB 750 mg/150 ml premix 750 mg Intravenous Q12H [name], MD  750 mg at 08/20/15 1029  Allergies as of 08/17/2015 - Review Complete 08/17/2015  Allergen Reaction Noted  . Albuterol Palpitations 05/01/2015  . Influenza vaccine live Swelling 04/24/2011    Family History  Problem Relation Age of Onset  . Hypertension Mother   . Diabetes Mother   . Heart attack Mother   . Heart failure Mother   . Hypertension Sister   . Diabetes Sister   . Heart failure Sister   . Colon cancer Neg Hx     Social History   Social History  . Marital Status: Married    Spouse Name: [name]  . Number of Children: 4  . Years of Education: 10   Occupational History  . Not on file.   Social History Main Topics  . Smoking status: Former Smoker -- 1.00 packs/day for 45 years    Types: Cigarettes    Quit date: 05/01/2015  . Smokeless tobacco: Never Used  . Alcohol Use: No  . Drug Use: No  . Sexual Activity: Yes    Birth Control/ Protection: Surgical   Other Topics Concern  . Not on file   Social History Narrative   Patient is married [name]) and lives at home with his wife and one child.   Patient has four children.   Patient is disabled.   Patient has a high school education.   Patient is left-handed.   Patient does not drink any caffeine.    Review of Systems: Gen:  see HPI  CV: Denies chest pain, heart palpitations, syncope, edema  Resp: +SOB, +DOE  GI: see HPI  GU : Denies urinary burning, urinary frequency, urinary incontinence.  MS: +joint pain  Derm: Denies rash, itching, dry skin Psych: +anxiety  Heme: Denies bruising, bleeding, and enlarged lymph nodes.  Physical Exam: Vital signs in last 24 hours: Temp: [96.9 F (36.1 C)-98.1 F (36.7 C)] 97.7 F (36.5 C) (02/06 1130) Pulse Rate: [50-108] 71 (02/06 1000) Resp: [13-26] 17 (02/06 1000) BP: (110-147)/(66-89) 129/73 mmHg (02/06 1027) SpO2: [89 %-100 %] 93 % (02/06 1000) Weight: [162 lb 11.2 oz (73.8 kg)] 162 lb 11.2 oz (73.8 kg) (02/06 0500) Last BM Date: 08/19/15 General: Alert, Sallow-appearing, appears chronically ill.  Head: Normocephalic and atraumatic. Eyes: Sclera clear, no icterus.  Ears: Normal auditory acuity. Nose: No deformity, discharge, or lesions. Mouth: No deformity or lesions, edentulous  Lungs: Scattered rhonchi, coarse bilaterally, diminished in bases. Normal respiratory rate but appears to use accessory muscles in breathing Heart: S1 S2 present, irregularly irregular  Abdomen: Distended, tense, anasarca. +BS. TTP RUQ Rectal: Deferred until time of colonoscopy.  Msk: Symmetrical without gross deformities. Normal posture. Extremities: 2-3+ pitting lower extremity edema to thigh  Neurologic: Alert and oriented x4; grossly normal neurologically. Psych: Alert and cooperative. Normal mood and affect.  Intake/Output from previous day: 02/05 0701 - 02/06 0700 In: 2050 [I.V.:1650; IV Piggyback:400] Out: 1550 [Urine:1550] Intake/Output this shift: Total I/O In: 300 [IV Piggyback:300] Out: 1600 [Urine:1600]  Lab Results:  Recent Labs (last 2 labs)      Recent Labs  08/18/15 0533 08/19/15 0514  WBC 3.6* 5.9  HGB 7.7* 7.1*  HCT 24.3* 23.0*  PLT 158 207      Recent Labs    Lab Results  Component Value  Date   IRON 42* 08/17/2015   TIBC 272 08/17/2015   FERRITIN 307 08/17/2015      BMET  Recent Labs (last 2 labs)      Recent Labs  08/18/15 0533 08/19/15 0514 08/20/15 0521  NA  138 136 139  K 3.9 3.1* 3.4*  CL 97* 96* 99*  CO2 '31 29 31  '$ GLUCOSE 455* 340* 386*  BUN 21* 33* 35*  CREATININE 1.46* 1.28* 1.24  CALCIUM 7.9* 7.9* 7.7*     LFT  Recent Labs (last 2 labs)      Recent Labs  08/18/15 0533 08/19/15 0514 08/20/15 0521  PROT 6.4* 5.9* 5.8*  ALBUMIN 2.3* 2.3* 2.3*  AST 223* 141* 63*  ALT 319* 301* 234*  ALKPHOS 103 88 75  BILITOT 0.6 0.4 0.3  BILIDIR --  --  0.1  IBILI --  --  0.2*     PT/INR  Recent Labs (last 2 labs)      Recent Labs  08/20/15 0521  LABPROT 16.0*  INR 1.26      Studies/Results:  Imaging Results (Last 48 hours)    US Abdomen Complete  08/20/2015 CLINICAL DATA: Elevated LFTs, upper abdominal pain EXAM: ABDOMEN ULTRASOUND COMPLETE COMPARISON: CT abdomen pelvis dated 05/01/2015 FINDINGS: Gallbladder: No gallstones, gallbladder wall thickening, or pericholecystic fluid. Negative sonographic Murphy's sign. Common bile duct: Diameter: 4 mm Liver: Coarse hepatic echotexture with hyperechoic hepatic parenchyma, nonspecific but likely reflecting hepatic steatosis. No focal hepatic lesion is seen. IVC: No abnormality visualized. Pancreas: Visualized portion unremarkable. Spleen: Size and appearance within normal limits. Right Kidney: Length: 12.3 cm. No mass or hydronephrosis. Left Kidney: Length: 12.7 cm. No mass or hydronephrosis. Abdominal aorta: No aneurysm visualized. Other findings: None. IMPRESSION: Suspected hepatic steatosis. Otherwise negative abdominal ultrasound. Electronically Signed By: Julian Hy M.D. On: 08/20/2015 11:56   US Venous Img Lower Bilateral  08/20/2015 CLINICAL DATA: Lower extremity swelling for 1 month EXAM:  BILATERAL LOWER EXTREMITY VENOUS DOPPLER ULTRASOUND TECHNIQUE: Gray-scale sonography with graded compression, as well as color Doppler and duplex ultrasound were performed to evaluate the lower extremity deep venous systems from the level of the common femoral vein and including the common femoral, femoral, profunda femoral, popliteal and calf veins including the posterior tibial, peroneal and gastrocnemius veins when visible. The superficial great saphenous vein was also interrogated. Spectral Doppler was utilized to evaluate flow at rest and with distal augmentation maneuvers in the common femoral, femoral and popliteal veins. COMPARISON: None. FINDINGS: RIGHT LOWER EXTREMITY Common Femoral Vein: No evidence of thrombus. Normal compressibility, respiratory phasicity and response to augmentation. Saphenofemoral Junction: No evidence of thrombus. Normal compressibility and flow on color Doppler imaging. Profunda Femoral Vein: No evidence of thrombus. Normal compressibility and flow on color Doppler imaging. Femoral Vein: No evidence of thrombus. Normal compressibility, respiratory phasicity and response to augmentation. Popliteal Vein: No evidence of thrombus. Normal compressibility, respiratory phasicity and response to augmentation. Calf Veins: No evidence of thrombus. Normal compressibility and flow on color Doppler imaging. Superficial Great Saphenous Vein: No evidence of thrombus. Normal compressibility and flow on color Doppler imaging. Venous Reflux: None. Other Findings: Mild subcutaneous edema is noted. LEFT LOWER EXTREMITY Common Femoral Vein: No evidence of thrombus. Normal compressibility, respiratory phasicity and response to augmentation. Saphenofemoral Junction: No evidence of thrombus. Normal compressibility and flow on color Doppler imaging. Profunda Femoral Vein: No evidence of thrombus. Normal compressibility and flow on color Doppler imaging. Femoral Vein: No evidence of thrombus. Normal  compressibility, respiratory phasicity and response to augmentation. Popliteal Vein: No evidence of thrombus. Normal compressibility, respiratory phasicity and response to augmentation. Calf Veins: No evidence of thrombus. Normal compressibility and flow on color Doppler imaging. Superficial Great Saphenous Vein: No evidence of thrombus. Normal compressibility and flow on color  Doppler imaging. Venous Reflux: None. Other Findings: Mild subcutaneous edema is noted. 3.9 cm lymph node is noted in the left groin. This is new from the prior exam of October 2016. IMPRESSION: No evidence of deep venous thrombosis. Bilateral lower extremity subcutaneous edema. Prominent lymph node in the left inguinal region likely reactive in nature. Clinical correlation is recommended. Electronically Signed By: Inez Catalina M.D. On: 08/20/2015 11:49     Impression: 55 year old male with multiple comorbidities, presenting this admission with probable pneumonia, acute on chronic respiratory failure in the setting of severe COPD at baseline. GI consulted due to anemia, likely IDA component and multifactorial in the setting of chronic disease. He has no overt GI bleeding whatsoever; negative hemoccult in ED. Notable, his Hgb was in the 5 range on this admission, requiring 2 units PRBCs. He has been maintaining in the 7 range currently. Upon review of historical labs, evidence of drifting Hgb began in Oct 2016; he had been maintaining a value of around 10, so this presentation is new. In the setting of chronic anticoagulation [facility]), concern remains for stuttering GI bleed anywhere in the GI tract. EGD fairly recent, as of Oct 2016 by Dr. Michail Sermon. Last colonoscopy several years ago and reportedly normal at [facility]. We will obtain these reports. He will definitely need GI evaluation due to new onset anemia; a colonoscopy would be recommended as a starting point, and a capsule study may need to be considered as well if no obvious  etiology on colonoscopy. For now, he is not stable from a respiratory standpoint to undergo a procedure. Will attempt to obtain medical records from [place] in the interim.   Elevated LFTs; in setting of fatty liver. He denies any prior known history of chronic liver disease. Likely transaminases elevated due to acute illness in the setting of fatty liver but unable to exclude chronic liver disease; will obtain baseline serologies to include viral markers for now. [place] without ascites. He has notable anasarca on exam. ECHO reviewed from Jan 2017 with EF of 60-65%. No stigmata of portal hypertension on recent EGD. Overall, LFTs improving. Elastography may need to be pursued as outpatient once acute illness resolved. Will obtain serologies for now.   Plan: Will attempt to obtain colonoscopy reports from [place] to follow H/H, repeat HFP in am Recommend colonoscopy at a minimum once respiratory status improves May need capsule study Serologies to include viral markers, autoimmune, etc Will continue to follow with you   Orvil Feil, ANP-BC [facility]       LOS: 3 days   08/20/2015, 12:47 PM

## 2015-08-21 ENCOUNTER — Encounter (HOSPITAL_COMMUNITY): Payer: Self-pay | Admitting: Gastroenterology

## 2015-08-21 ENCOUNTER — Inpatient Hospital Stay (HOSPITAL_COMMUNITY): Payer: Medicare Other

## 2015-08-21 DIAGNOSIS — R600 Localized edema: Secondary | ICD-10-CM

## 2015-08-21 DIAGNOSIS — I481 Persistent atrial fibrillation: Secondary | ICD-10-CM

## 2015-08-21 DIAGNOSIS — D508 Other iron deficiency anemias: Secondary | ICD-10-CM

## 2015-08-21 LAB — CBC WITH DIFFERENTIAL/PLATELET
Basophils Absolute: 0 10*3/uL (ref 0.0–0.1)
Basophils Relative: 1 %
Eosinophils Absolute: 0 10*3/uL (ref 0.0–0.7)
Eosinophils Relative: 0 %
HEMATOCRIT: 24.8 % — AB (ref 39.0–52.0)
HEMOGLOBIN: 7.6 g/dL — AB (ref 13.0–17.0)
LYMPHS ABS: 1.2 10*3/uL (ref 0.7–4.0)
LYMPHS PCT: 18 %
MCH: 23.4 pg — AB (ref 26.0–34.0)
MCHC: 30.6 g/dL (ref 30.0–36.0)
MCV: 76.3 fL — ABNORMAL LOW (ref 78.0–100.0)
MONOS PCT: 13 %
Monocytes Absolute: 0.8 10*3/uL (ref 0.1–1.0)
NEUTROS ABS: 4.5 10*3/uL (ref 1.7–7.7)
NEUTROS PCT: 68 %
Platelets: 253 10*3/uL (ref 150–400)
RBC: 3.25 MIL/uL — ABNORMAL LOW (ref 4.22–5.81)
RDW: 21.7 % — ABNORMAL HIGH (ref 11.5–15.5)
WBC: 6.5 10*3/uL (ref 4.0–10.5)

## 2015-08-21 LAB — HEPATITIS B SURFACE ANTIGEN: HEP B S AG: NEGATIVE

## 2015-08-21 LAB — GLUCOSE, CAPILLARY
GLUCOSE-CAPILLARY: 187 mg/dL — AB (ref 65–99)
Glucose-Capillary: 280 mg/dL — ABNORMAL HIGH (ref 65–99)
Glucose-Capillary: 415 mg/dL — ABNORMAL HIGH (ref 65–99)
Glucose-Capillary: 236 mg/dL — ABNORMAL HIGH (ref 65–99)

## 2015-08-21 LAB — HEPATIC FUNCTION PANEL
ALBUMIN: 2.3 g/dL — AB (ref 3.5–5.0)
ALK PHOS: 71 U/L (ref 38–126)
ALT: 187 U/L — AB (ref 17–63)
AST: 46 U/L — ABNORMAL HIGH (ref 15–41)
BILIRUBIN TOTAL: 0.4 mg/dL (ref 0.3–1.2)
Bilirubin, Direct: 0.1 mg/dL (ref 0.1–0.5)
Indirect Bilirubin: 0.3 mg/dL (ref 0.3–0.9)
Total Protein: 5.9 g/dL — ABNORMAL LOW (ref 6.5–8.1)

## 2015-08-21 LAB — CULTURE, RESPIRATORY: CULTURE: NORMAL

## 2015-08-21 LAB — BASIC METABOLIC PANEL
Anion gap: 11 (ref 5–15)
BUN: 36 mg/dL — AB (ref 6–20)
CHLORIDE: 97 mmol/L — AB (ref 101–111)
CO2: 33 mmol/L — AB (ref 22–32)
CREATININE: 1.25 mg/dL — AB (ref 0.61–1.24)
Calcium: 8 mg/dL — ABNORMAL LOW (ref 8.9–10.3)
GFR calc non Af Amer: >60 mL/min (ref >60)
Glucose, Bld: 323 mg/dL — ABNORMAL HIGH (ref 65–99)
POTASSIUM: 3.2 mmol/L — AB (ref 3.5–5.1)
Sodium: 141 mmol/L (ref 135–145)

## 2015-08-21 LAB — ALPHA-1-ANTITRYPSIN: A-1 Antitrypsin, Ser: 277 mg/dL — ABNORMAL HIGH (ref 90–200)

## 2015-08-21 LAB — MITOCHONDRIAL ANTIBODIES: Mitochondrial M2 Ab, IgG: 6.8 Units (ref 0.0–20.0)

## 2015-08-21 LAB — CULTURE, RESPIRATORY W GRAM STAIN

## 2015-08-21 LAB — ANTINUCLEAR ANTIBODIES, IFA: ANA Ab, IFA: NEGATIVE

## 2015-08-21 LAB — CERULOPLASMIN: Ceruloplasmin: 51.4 mg/dL — ABNORMAL HIGH (ref 16.0–31.0)

## 2015-08-21 LAB — ANTI-SMOOTH MUSCLE ANTIBODY, IGG: F-Actin IgG: 12 Units (ref 0–19)

## 2015-08-21 LAB — HEPATITIS C ANTIBODY

## 2015-08-21 MED ORDER — OXYCODONE-ACETAMINOPHEN 5-325 MG PO TABS
1.0000 | ORAL_TABLET | ORAL | Status: DC
  Administered 2015-08-21 – 2015-08-25 (×24): 1 via ORAL
  Filled 2015-08-21 (×24): qty 1

## 2015-08-21 MED ORDER — INSULIN ASPART 100 UNIT/ML ~~LOC~~ SOLN
8.0000 [IU] | Freq: Three times a day (TID) | SUBCUTANEOUS | Status: DC
  Administered 2015-08-21 – 2015-08-25 (×13): 8 [IU] via SUBCUTANEOUS

## 2015-08-21 MED ORDER — POTASSIUM CHLORIDE CRYS ER 20 MEQ PO TBCR
20.0000 meq | EXTENDED_RELEASE_TABLET | Freq: Two times a day (BID) | ORAL | Status: DC
  Administered 2015-08-21 (×2): 20 meq via ORAL
  Filled 2015-08-21 (×2): qty 1

## 2015-08-21 MED ORDER — INSULIN GLARGINE 100 UNIT/ML ~~LOC~~ SOLN
35.0000 [IU] | Freq: Every day | SUBCUTANEOUS | Status: DC
Start: 2015-08-21 — End: 2015-08-25
  Administered 2015-08-21 – 2015-08-25 (×5): 35 [IU] via SUBCUTANEOUS
  Filled 2015-08-21 (×7): qty 0.35

## 2015-08-21 MED ORDER — VANCOMYCIN HCL IN DEXTROSE 750-5 MG/150ML-% IV SOLN
INTRAVENOUS | Status: AC
  Filled 2015-08-21: qty 150

## 2015-08-21 NOTE — Progress Notes (Signed)
Inpatient Diabetes Program Recommendations  AACE/ADA: New Consensus Statement on Inpatient Glycemic Control (2015)  Target Ranges:  Prepandial:   less than 140 mg/dL      Peak postprandial:   less than 180 mg/dL (1-2 hours)      Critically ill patients:  140 - 180 mg/dL   Review of Glycemic Control  Diabetes history: DM2 Outpatient Diabetes medications: Metformin 500 mg QAM, Novolog 0-20 units TID with meals Current orders for Inpatient glycemic control: Lantus 30 units daily, Novolog 0-20 units TID with meals, Novolog 0-5 units QHS, Metformin 500 mg QAM, Novolog 5 units TID with meals for meal coverage  Inpatient Diabetes Program Recommendations: Insulin - Basal: If steroids are continued as ordered (Solumedrol 60 mg Q12H), please consider increasing Lantus to 35 units daily. Insulin - Meal Coverage: If steroids are continued as ordered, please consider increasing meal coverage to Novolog 8 units TID with meals.  Thanks, Barnie Alderman, RN, MSN, CDE Diabetes Coordinator Inpatient Diabetes Program [phone] (Team Pager from [place] to [place]) [phone] ([facility] office) [phone] [facility] office) [phone] [facility] office)

## 2015-08-21 NOTE — Progress Notes (Signed)
Dr Willey Blade called for bedtime CBG of 404; order received on the chart
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
## 2015-08-21 NOTE — Progress Notes (Signed)
Subjective: Feels better from a breathing standpoint today but still not at baseline. No abdominal pain, N/V. No overt GI bleeding. Procedure notes from [facility] in paper chart, noting a normal colonoscopy in 2012 by Dr. Posey Pronto. Appears he also has a history of Barrett's on EGD in 2011.  Objective: Vital signs in last 24 hours: Temp:  [97 F (36.1 C)-97.8 F (36.6 C)] 97 F (36.1 C) (02/07 0730) Pulse Rate:  [47-99] 68 (02/07 0800) Resp:  [15-28] 15 (02/07 0800) BP: (108-145)/(65-85) 131/85 mmHg (02/07 0800) SpO2:  [70 %-100 %] 99 % (02/07 0800) FiO2 (%):  [55 %] 55 % (02/07 0400) Weight:  [157 lb 6.5 oz (71.4 kg)] 157 lb 6.5 oz (71.4 kg) (02/07 0500) Last BM Date: 08/20/15 General:   Alert and oriented, pleasant Head:  Normocephalic and atraumatic. Eyes:  No icterus, sclera clear. Conjuctiva pink.  Lungs: diminished bases, scattered rhonchi Abdomen:  Bowel sounds present, distended but remains soft, anasarca noted Extremities:  2+ pitting lower extremity edema to thigh, somewhat improved from yesterday  Neurologic:  Alert and  oriented x4;  grossly normal neurologically. Psych:  Alert and cooperative. Normal mood and affect.  Intake/Output from previous day: 02/06 0701 - 02/07 0700 In: 3893 [P.O.:440; I.V.:2853; IV Piggyback:600] Out: 5701 [Urine:5700; Stool:1] Intake/Output this shift:    Lab Results:  Recent Labs  08/19/15 0514 08/21/15 0545  WBC 5.9 6.5  HGB 7.1* 7.6*  HCT 23.0* 24.8*  PLT 207 253   BMET  Recent Labs  08/19/15 0514 08/20/15 0521 08/21/15 0545  NA 136 139 141  K 3.1* 3.4* 3.2*  CL 96* 99* 97*  CO2 29 31 33*  GLUCOSE 340* 386* 323*  BUN 33* 35* 36*  CREATININE 1.28* 1.24 1.25*  CALCIUM 7.9* 7.7* 8.0*   LFT  Recent Labs  08/19/15 0514 08/20/15 0521 08/21/15 0545  PROT 5.9* 5.8* 5.9*  ALBUMIN 2.3* 2.3* 2.3*  AST 141* 63* 46*  ALT 301* 234* 187*  ALKPHOS 88 75 71  BILITOT 0.4 0.3 0.4  BILIDIR  --  0.1 0.1  IBILI  --  0.2*  0.3   PT/INR  Recent Labs  08/20/15 0521  LABPROT 16.0*  INR 1.26    Studies/Results: US Abdomen Complete  08/20/2015  CLINICAL DATA:  Elevated LFTs, upper abdominal pain EXAM: ABDOMEN ULTRASOUND COMPLETE COMPARISON:  CT abdomen pelvis dated 05/01/2015 FINDINGS: Gallbladder: No gallstones, gallbladder wall thickening, or pericholecystic fluid. Negative sonographic Murphy's sign. Common bile duct: Diameter: 4 mm Liver: Coarse hepatic echotexture with hyperechoic hepatic parenchyma, nonspecific but likely reflecting hepatic steatosis. No focal hepatic lesion is seen. IVC: No abnormality visualized. Pancreas: Visualized portion unremarkable. Spleen: Size and appearance within normal limits. Right Kidney: Length: 12.3 cm.  No mass or hydronephrosis. Left Kidney: Length: 12.7 cm.  No mass or hydronephrosis. Abdominal aorta: No aneurysm visualized. Other findings: None. IMPRESSION: Suspected hepatic steatosis. Otherwise negative abdominal ultrasound. Electronically Signed   By: Julian Hy M.D.   On: 08/20/2015 11:56   US Venous Img Lower Bilateral  08/20/2015  CLINICAL DATA:  Lower extremity swelling for 1 month EXAM: BILATERAL LOWER EXTREMITY VENOUS DOPPLER ULTRASOUND TECHNIQUE: Gray-scale sonography with graded compression, as well as color Doppler and duplex ultrasound were performed to evaluate the lower extremity deep venous systems from the level of the common femoral vein and including the common femoral, femoral, profunda femoral, popliteal and calf veins including the posterior tibial, peroneal and gastrocnemius veins when visible. The superficial great saphenous vein was  also interrogated. Spectral Doppler was utilized to evaluate flow at rest and with distal augmentation maneuvers in the common femoral, femoral and popliteal veins. COMPARISON:  None. FINDINGS: RIGHT LOWER EXTREMITY Common Femoral Vein: No evidence of thrombus. Normal compressibility, respiratory phasicity and response to  augmentation. Saphenofemoral Junction: No evidence of thrombus. Normal compressibility and flow on color Doppler imaging. Profunda Femoral Vein: No evidence of thrombus. Normal compressibility and flow on color Doppler imaging. Femoral Vein: No evidence of thrombus. Normal compressibility, respiratory phasicity and response to augmentation. Popliteal Vein: No evidence of thrombus. Normal compressibility, respiratory phasicity and response to augmentation. Calf Veins: No evidence of thrombus. Normal compressibility and flow on color Doppler imaging. Superficial Great Saphenous Vein: No evidence of thrombus. Normal compressibility and flow on color Doppler imaging. Venous Reflux:  None. Other Findings:  Mild subcutaneous edema is noted. LEFT LOWER EXTREMITY Common Femoral Vein: No evidence of thrombus. Normal compressibility, respiratory phasicity and response to augmentation. Saphenofemoral Junction: No evidence of thrombus. Normal compressibility and flow on color Doppler imaging. Profunda Femoral Vein: No evidence of thrombus. Normal compressibility and flow on color Doppler imaging. Femoral Vein: No evidence of thrombus. Normal compressibility, respiratory phasicity and response to augmentation. Popliteal Vein: No evidence of thrombus. Normal compressibility, respiratory phasicity and response to augmentation. Calf Veins: No evidence of thrombus. Normal compressibility and flow on color Doppler imaging. Superficial Great Saphenous Vein: No evidence of thrombus. Normal compressibility and flow on color Doppler imaging. Venous Reflux:  None. Other Findings: Mild subcutaneous edema is noted. 3.9 cm lymph node is noted in the left groin. This is new from the prior exam of October 2016. IMPRESSION: No evidence of deep venous thrombosis. Bilateral lower extremity subcutaneous edema. Prominent lymph node in the left inguinal region likely reactive in nature. Clinical correlation is recommended. Electronically Signed    By: Inez Catalina M.D.   On: 08/20/2015 11:49   Dg Foot 2 Views Left  08/20/2015  CLINICAL DATA:  LEFT foot pain and swelling EXAM: LEFT FOOT - 2 VIEW COMPARISON:  Portable exam 1413 hours compared to 05/01/2015 FINDINGS: Bones appear demineralized. Joint spaces preserved. Displaced fracture fragment identified at distal lateral aspect of the calcaneus at calcaneal cuboid joint. No additional fracture, dislocation or bone destruction. Diffuse soft tissue swelling LEFT foot greatest at dorsum. IMPRESSION: Subacute distal LEFT calcaneal fracture extending intact calcaneocuboid joint. Marked soft tissue swelling without additional acute bony abnormalities. Electronically Signed   By: Lavonia Dana M.D.   On: 08/20/2015 14:33    Assessment: 55 year old male with multiple comorbidities, presenting this admission with probable pneumonia, acute on chronic respiratory failure in the setting of severe COPD at baseline. GI consulted due to anemia, likely IDA component and multifactorial in the setting of chronic disease. He has no overt GI bleeding whatsoever; negative hemoccult in ED. Notable, his Hgb was in the 5 range on this admission, requiring 2 units PRBCs. He has been maintaining in the 7 range currently. Upon review of historical labs, evidence of drifting Hgb began in Oct 2016; he had been maintaining a value of around 10, so this presentation is new. In the setting of chronic anticoagulation [facility]), concern remains for stuttering GI bleed anywhere in the GI tract. EGD fairly recent, as of Oct 2016 by Dr. Michail Sermon. However, upon further evaluation of procedure note, he had food in his stomach, which precluded complete evaluation of upper GI tract. He also has a history of Barrett's esophagus noted in 2011; recent EGD in 2016 without  any biopsies completed.  Last colonoscopy in 2012 normal. He will definitely need GI evaluation due to new onset anemia; at this point, would recommend a colonoscopy with repeat  EGD for complete evaluation of upper GI tract and biopsy, as prior EGD was not entirely complete. Capsule study could be considered if no obvious source for anemia at a later date. For now, he is not stable from a respiratory standpoint to undergo a procedure. Will continue to follow.    Elevated LFTs: in setting of fatty liver. He denies any prior known history of chronic liver disease. Likely transaminases elevated due to acute illness in the setting of fatty liver but unable to exclude chronic liver disease; baseline serologies pending with negative Hep B and C. [place] without ascites. He has notable anasarca on exam. ECHO reviewed from Jan 2017 with EF of 60-65%. No stigmata of portal hypertension on recent EGD. Overall, LFTs continue to improve. Elastography may need to be pursued as outpatient once acute illness resolved. Ceruloplasmin: elevated at 51.4 but is also an acute phase reactant and normally low in Wilson's disease; consider repeating after acute illness resolved.  Further serologies pending.    Plan: Continue to follow H/H; no procedure until improved from respiratory standpoint Colonoscopy/EGD as outpatient vs inpatient if marked clinical improvement while inpatient Follow-up on remaining serologies Continue Protonix once daily   Orvil Feil, ANP-BC [facility]    LOS: 4 days    08/21/2015, 8:20 AM

## 2015-08-21 NOTE — Consult Note (Signed)
CARDIOLOGY CONSULT NOTE   Patient ID: Ryan [name] MRN: [id number] DOB/AGE: December 23, 1960 55 y.o.  Admit Date: 08/17/2015 Referring Physician: [name] MD Primary Physician: Alonza Bogus, MD Consulting Cardiologist: Kate Sable, MD Primary Cardiologist:  Carlyle Dolly  Reason for Consultation: CHF, with hx of atrial fib  Clinical Summary Ryan [name] is a 55 y.o.male  with known history of COPD, diabetes, nonobstructive coronary artery disease per catheterization in 2007, atrial fibrillation, CHADS VASC Score of 3, on [place], with other history to include diabetes, GERD. He was admitted on 08/17/2015 in the setting of worsening dyspnea, and hypoxia with O2 sats in the 70s. He was initially placed on 11 3 mass, started on nebulizer treatments, and has been followed by pulmonology. He is currently being treated for MRSA bacteremia. On admission he was found to be anemic with a hemoglobin of 8.1, trending downward to the lowest of 7.1. GI was consulted. We are asked for cardiology recommendations. Due to lower extremity edema and questionable diastolic CHF.  He states that after he was discharged from the hospital in January, he began to have LEE and abdominal swelling. He was seen by Dr. Luan Pulling who increased his lasix and his potassium. He states he has been drinking a lot of fluids due to thirst. He also complains that the steroids are making him very hungry.   He is currently on lasix 40 mg BID and has had 4.6 liter output, after beginning with IV lasix. He continues to have 2+-3+ bilaterally LEE.    Allergies  Allergen Reactions  . Albuterol Palpitations  . Influenza Vaccine Live Swelling    Medications Scheduled Medications: . sodium chloride   Intravenous Once  . baclofen  10 mg Oral TID  . benzonatate  200 mg Oral TID  . ceFEPime (MAXIPIME) IV  1 g Intravenous 3 times per day  . diltiazem  120 mg Oral Daily  . edoxaban  60 mg Oral Daily  . furosemide  40 mg  Oral BID  . insulin aspart  0-20 Units Subcutaneous TID WC  . insulin aspart  0-5 Units Subcutaneous QHS  . insulin aspart  5 Units Subcutaneous TID WC  . insulin glargine  30 Units Subcutaneous Daily  . levalbuterol  0.63 mg Nebulization Q6H  . metFORMIN  500 mg Oral Q breakfast  . methylPREDNISolone (SOLU-MEDROL) injection  60 mg Intravenous Q12H  . nicotine  14 mg Transdermal Daily  . oxyCODONE-acetaminophen  1 tablet Oral Q4H  . pantoprazole  40 mg Oral Daily  . sodium chloride flush  3 mL Intravenous Q12H  . vancomycin  750 mg Intravenous Q12H    Infusions: . sodium chloride 75 mL/hr at 08/21/15 0600    PRN Medications: acetaminophen **OR** acetaminophen, alum & mag hydroxide-simeth, levalbuterol, LORazepam   Past Medical History  Diagnosis Date  . COPD (chronic obstructive pulmonary disease) ([place])   . Hypercholesteremia   . History of pneumonia   . Polycythemia   . Coronary atherosclerosis of native coronary artery     Mild nonobstructive disease at catheterization 2007  . 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 Borderline diabetes   . Seizures ([place])     Last seizure 2 yrs ago  . GERD (gastroesophageal reflux disease)   . Type 2 diabetes mellitus ([place])   . DDD (degenerative disc disease)     Cervical and thoracic  . Chronic back pain   . Bronchitis   .  Chronic left shoulder pain   . Chronic neck pain   . Atrial fibrillation ([place])     Not anticoagulated  . Smoker   . Type 2 diabetes mellitus without complication ([place])   . CAP (community acquired pneumonia) 07/10/2013    04/2015  . Collagen vascular disease ([place])   . Chronic respiratory failure [facility])     Past Surgical History  Procedure Laterality Date  . [facility]      [facility] per patient and wife. Requesting records   . Vasectomy    . Throat biopsy    . Lung biopsy    . Vasectomy  1987  . Esophagogastroduodenoscopy N/A 05/04/2015    Dr.  Michail Sermon: minimal erosive esophagitis, small hiatal hernia    Family History  Problem Relation Age of Onset  . Hypertension Mother   . Diabetes Mother   . Heart attack Mother   . Heart failure Mother   . Hypertension Sister   . Diabetes Sister   . Heart failure Sister   . Colon cancer Neg Hx     Social History Ryan [name] reports that he quit smoking about 3 months ago. His smoking use included Cigarettes. He has a 45 pack-year smoking history. He has never used smokeless tobacco. Ryan [name] reports that he does not drink alcohol.  Review of Systems Complete review of systems are found to be negative unless outlined in H&P above.  Physical Examination Blood pressure 131/85, pulse 68, temperature 97 F (36.1 C), temperature source Oral, resp. rate 15, height '5\' 8"'$  (1.727 m), weight 157 lb 6.5 oz (71.4 kg), SpO2 99 %.  Intake/Output Summary (Last 24 hours) at 08/21/15 0854 Last data filed at 08/21/15 2595  Gross per 24 hour  Intake   3893 ml  Output   5701 ml  Net  -1808 ml    Telemetry: SR  GEN:Ill appearing male, no acute distress  HEENT: Conjunctiva and lids normal, oropharynx clear with moist mucosa. Neck: Supple, no elevated JVP or carotid bruits, no thyromegaly. Lungs: Minimal crackles in the bases.  Cardiac: Regular rate and rhythm, no S3 or significant systolic murmur, no pericardial rub. Abdomen: Soft, distended, no hepatomegaly, bowel sounds present, no guarding or rebound. Extremities: 2+ pitting pre-tibial edema, distal pulses diminished Skin: Warm and dry.Pale Musculoskeletal: No kyphosis. Neuropsychiatric: Alert and oriented x3, affect grossly appropriate.  Prior Cardiac Testing/Procedures 1. Echocardiogram 07/16/2015 Left ventricle: The cavity size was normal. Systolic function was normal. The estimated ejection fraction was in the range of 60% to 65%. Wall motion was normal; there were no regional wall motion abnormalities. Left ventricular  diastolic function parameters were normal. There was no evidence of elevated ventricular filling pressure by Doppler parameters. - Aortic valve: Trileaflet; normal thickness leaflets. There was no regurgitation. - Aortic root: The aortic root was normal in size. - Mitral valve: Structurally normal valve. There was trivial regurgitation. - Left atrium: The atrium was normal in size. - Right ventricle: Systolic function was normal. - Right atrium: The atrium was normal in size. - Pulmonary arteries: The main pulmonary artery was normal-sized. Systolic pressure was within the normal range. - Inferior vena cava: The vessel was normal in size. - Pericardium, extracardiac: There was no pericardial effusion.  NM Stress Test 05/04/2013 IMPRESSION: Overall low risk regadenoson Cardiolite. No diagnostic ST segment abnormalities were noted. There is evidence of diaphragmatic attenuation, less likely inferior septal/inferior scar. No definite ischemia. LVEF 49% with normal volumes, no definite focal wall motion  abnormalities.  Cardiac Cath 10/16/2005 ASSESSMENT: 1. Mild nonobstructive coronary disease. 2. Normal left ventricular function.  PLAN/DISCUSSION: I suspect his chest and arm pain is likely musculoskeletal. I question whether or not he had some component of cervical disk disease. He certainly will need risk factor modification to prevent progression of his coronary disease, however. He is otherwise stable for discharge later today if his groin remains stable after bedrest.   Lab Results  Basic Metabolic Panel:  Recent Labs Lab 08/17/15 1157 08/18/15 0533 08/19/15 0514 08/20/15 0521 08/21/15 0545  NA 135 138 136 139 141  K 3.6 3.9 3.1* 3.4* 3.2*  CL 96* 97* 96* 99* 97*  CO2 '31 31 29 31 '$ 33*  GLUCOSE 186* 455* 340* 386* 323*  BUN 13 21* 33* 35* 36*  CREATININE 1.23 1.46* 1.28* 1.24 1.25*  CALCIUM 7.7* 7.9* 7.9* 7.7* 8.0*    Liver Function  Tests:  Recent Labs Lab 08/17/15 1157 08/18/15 0533 08/19/15 0514 08/20/15 0521 08/21/15 0545  AST 217* 223* 141* 63* 46*  ALT 287* 319* 301* 234* 187*  ALKPHOS 112 103 88 75 71  BILITOT 1.0 0.6 0.4 0.3 0.4  PROT 6.3* 6.4* 5.9* 5.8* 5.9*  ALBUMIN 2.4* 2.3* 2.3* 2.3* 2.3*    CBC:  Recent Labs Lab 08/17/15 1110 08/17/15 1118 08/17/15 1157 08/18/15 0533 08/19/15 0514 08/21/15 0545  WBC 3.4*  --  4.8 3.6* 5.9 6.5  NEUTROABS 2.3  --  3.7  --   --  4.5  HGB 5.7* 7.5* 8.1* 7.7* 7.1* 7.6*  HCT 18.6* 22.0* 25.8* 24.3* 23.0* 24.8*  MCV 76.5*  --  76.1* 76.4* 76.2* 76.3*  PLT 105*  --  152 158 207 253    Radiology: US Abdomen Complete  08/20/2015  CLINICAL DATA:  Elevated LFTs, upper abdominal pain EXAM: ABDOMEN ULTRASOUND COMPLETE COMPARISON:  CT abdomen pelvis dated 05/01/2015 FINDINGS: Gallbladder: No gallstones, gallbladder wall thickening, or pericholecystic fluid. Negative sonographic Murphy's sign. Common bile duct: Diameter: 4 mm Liver: Coarse hepatic echotexture with hyperechoic hepatic parenchyma, nonspecific but likely reflecting hepatic steatosis. No focal hepatic lesion is seen. IVC: No abnormality visualized. Pancreas: Visualized portion unremarkable. Spleen: Size and appearance within normal limits. Right Kidney: Length: 12.3 cm.  No mass or hydronephrosis. Left Kidney: Length: 12.7 cm.  No mass or hydronephrosis. Abdominal aorta: No aneurysm visualized. Other findings: None. IMPRESSION: Suspected hepatic steatosis. Otherwise negative abdominal ultrasound. Electronically Signed   By: Julian Hy M.D.   On: 08/20/2015 11:56   US Venous Img Lower Bilateral  08/20/2015  CLINICAL DATA:  Lower extremity swelling for 1 month EXAM: BILATERAL LOWER EXTREMITY VENOUS DOPPLER ULTRASOUND TECHNIQUE: Gray-scale sonography with graded compression, as well as color Doppler and duplex ultrasound were performed to evaluate the lower extremity deep venous systems from the level of the  common femoral vein and including the common femoral, femoral, profunda femoral, popliteal and calf veins including the posterior tibial, peroneal and gastrocnemius veins when visible. The superficial great saphenous vein was also interrogated. Spectral Doppler was utilized to evaluate flow at rest and with distal augmentation maneuvers in the common femoral, femoral and popliteal veins. COMPARISON:  None. FINDINGS: RIGHT LOWER EXTREMITY Common Femoral Vein: No evidence of thrombus. Normal compressibility, respiratory phasicity and response to augmentation. Saphenofemoral Junction: No evidence of thrombus. Normal compressibility and flow on color Doppler imaging. Profunda Femoral Vein: No evidence of thrombus. Normal compressibility and flow on color Doppler imaging. Femoral Vein: No evidence of thrombus. Normal compressibility, respiratory phasicity and response  to augmentation. Popliteal Vein: No evidence of thrombus. Normal compressibility, respiratory phasicity and response to augmentation. Calf Veins: No evidence of thrombus. Normal compressibility and flow on color Doppler imaging. Superficial Great Saphenous Vein: No evidence of thrombus. Normal compressibility and flow on color Doppler imaging. Venous Reflux:  None. Other Findings:  Mild subcutaneous edema is noted. LEFT LOWER EXTREMITY Common Femoral Vein: No evidence of thrombus. Normal compressibility, respiratory phasicity and response to augmentation. Saphenofemoral Junction: No evidence of thrombus. Normal compressibility and flow on color Doppler imaging. Profunda Femoral Vein: No evidence of thrombus. Normal compressibility and flow on color Doppler imaging. Femoral Vein: No evidence of thrombus. Normal compressibility, respiratory phasicity and response to augmentation. Popliteal Vein: No evidence of thrombus. Normal compressibility, respiratory phasicity and response to augmentation. Calf Veins: No evidence of thrombus. Normal compressibility and  flow on color Doppler imaging. Superficial Great Saphenous Vein: No evidence of thrombus. Normal compressibility and flow on color Doppler imaging. Venous Reflux:  None. Other Findings: Mild subcutaneous edema is noted. 3.9 cm lymph node is noted in the left groin. This is new from the prior exam of October 2016. IMPRESSION: No evidence of deep venous thrombosis. Bilateral lower extremity subcutaneous edema. Prominent lymph node in the left inguinal region likely reactive in nature. Clinical correlation is recommended. Electronically Signed   By: Inez Catalina M.D.   On: 08/20/2015 11:49   Dg Foot 2 Views Left  08/20/2015  CLINICAL DATA:  LEFT foot pain and swelling EXAM: LEFT FOOT - 2 VIEW COMPARISON:  Portable exam 1413 hours compared to 05/01/2015 FINDINGS: Bones appear demineralized. Joint spaces preserved. Displaced fracture fragment identified at distal lateral aspect of the calcaneus at calcaneal cuboid joint. No additional fracture, dislocation or bone destruction. Diffuse soft tissue swelling LEFT foot greatest at dorsum. IMPRESSION: Subacute distal LEFT calcaneal fracture extending intact calcaneocuboid joint. Marked soft tissue swelling without additional acute bony abnormalities. Electronically Signed   By: Lavonia Dana M.D.   On: 08/20/2015 14:33     ECG: Sinus tachycardia rate of 134 bpm.   Impression and Recommendations  1. Bilateral leg edema:  His last echo in January of 2017 revealed normal EF. No evidence of diastolic dysfunction. He had initially been placed on IV lasix with good result, and continues on po lasix now. He continues to have significant LEE and abdominal distention. Creatinine 1.25. Will place on fluid restriction and continue po diuretics. No evidence of DVT on LE ultrasound.   2. Atrial fib: No evidence of atrial fib while hospitalized. He remains on diltiazem and S[place]. Will hold NOAC temporarily due to anemia. GI is following. Last EDG demonstrated erosive gastritis  in 04/2015. CHADS VASC Score of 3. Would check anemia profile. He denies active bleeding. Anemia may be playing a part in fluid retention.   3, COPD: Exacerbation on admission. He is being treated with antibiotics by Dr. Luan Pulling along with steroids.   4. Hx of tobacco abuse; Quit on day he was admitted to hospital   5. Hypokalemia: This is being repleated. Check magnesium      Signed: Phill Myron. [place] NP [place]  08/21/2015, 8:54 AM Co-Sign MD  The patient was seen and examined, and I agree with the assessment and plan as documented above, with modifications as noted below. Consulted regarding lower extremity edema. As noted above, echocardiogram in 07/2015 demonstrated normal systolic and diastolic function. He has hypoalbuminemia and is markedly anemic and has elevated transaminases, and I suspect this is the etiology of  his leg edema. BNP 33 also effectively excluding heart failure as the etiology. Venous Dopplers negative for DVT. In order to be prudent, will obtain a limited echo to specifically reassess left ventricular systolic and diastolic function. Will also temporarily hold direct oral anticoagulant.   Kate Sable, MD, [facility]  08/21/2015 10:23 AM

## 2015-08-21 NOTE — Evaluation (Signed)
Clinical/Bedside Swallow Evaluation Patient Details  Name: Ryan [name] MRN: [id number] Date of Birth: April 01, 1961  Today's Date: 08/21/2015 Time: SLP Start Time (ACUTE ONLY): 1400 SLP Stop Time (ACUTE ONLY): 1430 SLP Time Calculation (min) (ACUTE ONLY): 30 min  Past Medical History:  Past Medical History  Diagnosis Date  . COPD (chronic obstructive pulmonary disease) ([place])   . Hypercholesteremia   . History of pneumonia   . Polycythemia   . Coronary atherosclerosis of native coronary artery     Mild nonobstructive disease at catheterization 2007  . 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 Borderline diabetes   . Seizures ([place])     Last seizure 2 yrs ago  . GERD (gastroesophageal reflux disease)   . Type 2 diabetes mellitus ([place])   . DDD (degenerative disc disease)     Cervical and thoracic  . Chronic back pain   . Bronchitis   . Chronic left shoulder pain   . Chronic neck pain   . Atrial fibrillation ([place])     Not anticoagulated  . Smoker   . Type 2 diabetes mellitus without complication ([place])   . CAP (community acquired pneumonia) 07/10/2013    04/2015  . Collagen vascular disease ([place])   . Chronic respiratory failure ([place])   . Barrett's esophagus    Past Surgical History:  Past Surgical History  Procedure Laterality Date  . Colonoscopy  2012    Dr. Posey Pronto: normal  . Vasectomy    . Throat biopsy    . Lung biopsy    . Vasectomy  1987  . Esophagogastroduodenoscopy N/A 05/04/2015    Dr. Michail Sermon: minimal erosive esophagitis, small hiatal hernia. FOOD PRECLUDED A COMPLETE EXAM. No biopsies taken  . Esophagogastroduodenoscopy  2011    [place]: Barrett's    HPI:  Ryan [name] is a 55 y.o. year old male with a history of COPD, presenting with increasing shortness of breath and hypoxia. O2 sats were 70% in the field prior to arrival. Findings of questionable pneumonia on chest xray. History of MRSA bacteremia in Jan 2017, requiring  prolonged admission. GI consulted due to IDA. Denies any overt GI bleeding to include melena, hematochezia, hematemesis. Hemoccult negative in ED. Very rare nausea. Has felt light-headed and dizzy. Notes chronic GERD, on Protonix BID as outpatient. No dysphagia. No abdominal pain. Denies diarrhea, constipation. He denies any weight loss. Has a good appetite. Notes lower extremity pitting edema and feels like his abdomen is distended as well. He recently had an EGD by Dr. Michail Sermon in Oct 2016 with minimal erosive esophagitis and small hiatal hernia. However, he had food in his stomach, which precluded complete evaluation of upper GI tract. History of Barrett's on EGD in 2011. Elevated LFTs; in setting of fatty liver. He denies any prior known history of chronic liver disease. GI following and may do EGD once stable from respiratory standpoint. Dr. Luan Pulling requested SLP for swallowing evaluation.   Assessment / Plan / Recommendation Clinical Impression  Mr. Viernes was seen at bedside for a clinical swallow evaluation. Pt visibly short of breath and requiring increased oxygen this date. Oral motor examination is unremarkable. Pt endorses occasional coughing/choking during meals and reports that he frequently has PNA. He has never undergone objective assessment of oropharyngeal swallow, but did have EGD in October 2016. Given pt with subjective report of coughing/choking with meals, severe COPD, and frequent bouts of PNA, recommend objective testing tomorrow to rule out occult  aspiration. Pt did not show overt signs/symptoms aspiration at bedside this date, but is at risk for aspiration given severe COPD. Continue diet as ordered and continue aspiration and reflux precautions (pt endorses significant reflux) with objective testing tomorrow afternoon. Above to RN.     Aspiration Risk  Moderate aspiration risk    Diet Recommendation Regular;Thin liquid   Liquid Administration via: Cup;Straw Medication  Administration: Whole meds with liquid Supervision: Patient able to self feed;Intermittent supervision to cue for compensatory strategies Compensations: Slow rate;Small sips/bites;Multiple dry swallows after each bite/sip Postural Changes: Seated upright at 90 degrees;Remain upright for at least 30 minutes after po intake    Other  Recommendations Oral Care Recommendations: Oral care BID Other Recommendations: Clarify dietary restrictions   Follow up Recommendations  None    Frequency and Duration min 2x/week  1 week       Prognosis Prognosis for Safe Diet Advancement: Fair Barriers to Reach Goals:  (respiratory effort)      Clinical Swallow Study   General Date of Onset: 08/17/15 HPI: Ryan [name] is a 55 y.o. year old male with a history of COPD, presenting with increasing shortness of breath and hypoxia. O2 sats were 70% in the field prior to arrival. Findings of questionable pneumonia on chest xray. History of MRSA bacteremia in Jan 2017, requiring prolonged admission. GI consulted due to IDA. Denies any overt GI bleeding to include melena, hematochezia, hematemesis. Hemoccult negative in ED. Very rare nausea. Has felt light-headed and dizzy. Notes chronic GERD, on Protonix BID as outpatient. No dysphagia. No abdominal pain. Denies diarrhea, constipation. He denies any weight loss. Has a good appetite. Notes lower extremity pitting edema and feels like his abdomen is distended as well. He recently had an EGD by Dr. Michail Sermon in Oct 2016 with minimal erosive esophagitis and small hiatal hernia. However, he had food in his stomach, which precluded complete evaluation of upper GI tract. History of Barrett's on EGD in 2011. Elevated LFTs; in setting of fatty liver. He denies any prior known history of chronic liver disease. GI following and may do EGD once stable from respiratory standpoint. Dr. Luan Pulling requested SLP for swallowing evaluation. Type of Study: Bedside Swallow  Evaluation Previous Swallow Assessment: None on record Diet Prior to this Study: Regular;Thin liquids Temperature Spikes Noted: No Respiratory Status: Nasal cannula History of Recent Intubation: No Behavior/Cognition: Alert;Cooperative;Pleasant mood Oral Cavity Assessment: Within Functional Limits Oral Care Completed by SLP: Recent completion by staff Oral Cavity - Dentition: Edentulous (Pt does not usually wear his dentures) Vision: Functional for self-feeding Self-Feeding Abilities: Able to feed self Patient Positioning: Upright in bed Baseline Vocal Quality: Normal Volitional Cough: Strong;Congested Volitional Swallow: Able to elicit    Oral/Motor/Sensory Function Overall Oral Motor/Sensory Function: Within functional limits   Ice Chips Ice chips: Within functional limits Presentation: Spoon   Thin Liquid Thin Liquid: Within functional limits Presentation: Cup;Straw;Self Fed    Nectar Thick Nectar Thick Liquid: Not tested   Honey Thick     Puree Puree: Within functional limits Presentation: Spoon   Solid   Thank you,  Genene Churn, CCC-SLP [phone]    Solid: Within functional limits Presentation: Self Fed        PORTER,DABNEY 08/21/2015,5:34 PM

## 2015-08-21 NOTE — Progress Notes (Signed)
CBG 415, Dr Luan Pulling notified.  No new orders at this time.  Covered with SSC and meal coverage. Will continue to monitor.

## 2015-08-22 ENCOUNTER — Inpatient Hospital Stay (HOSPITAL_COMMUNITY): Payer: Medicare Other

## 2015-08-22 DIAGNOSIS — E876 Hypokalemia: Secondary | ICD-10-CM | POA: Diagnosis present

## 2015-08-22 LAB — CBC WITH DIFFERENTIAL/PLATELET
BASOS ABS: 0 10*3/uL (ref 0.0–0.1)
Basophils Relative: 0 %
EOS PCT: 0 %
Eosinophils Absolute: 0 10*3/uL (ref 0.0–0.7)
HEMATOCRIT: 24.6 % — AB (ref 39.0–52.0)
Hemoglobin: 7.6 g/dL — ABNORMAL LOW (ref 13.0–17.0)
LYMPHS PCT: 14 %
Lymphs Abs: 1 10*3/uL (ref 0.7–4.0)
MCH: 23.6 pg — ABNORMAL LOW (ref 26.0–34.0)
MCHC: 30.9 g/dL (ref 30.0–36.0)
MCV: 76.4 fL — AB (ref 78.0–100.0)
Monocytes Absolute: 0.9 10*3/uL (ref 0.1–1.0)
Monocytes Relative: 12 %
NEUTROS ABS: 5.5 10*3/uL (ref 1.7–7.7)
NEUTROS PCT: 74 %
PLATELETS: 268 10*3/uL (ref 150–400)
RBC: 3.22 MIL/uL — AB (ref 4.22–5.81)
RDW: 21.5 % — ABNORMAL HIGH (ref 11.5–15.5)
WBC: 7.4 10*3/uL (ref 4.0–10.5)

## 2015-08-22 LAB — FERRITIN: FERRITIN: 109 ng/mL (ref 24–336)

## 2015-08-22 LAB — BASIC METABOLIC PANEL
ANION GAP: 9 (ref 5–15)
BUN: 34 mg/dL — ABNORMAL HIGH (ref 6–20)
CO2: 34 mmol/L — ABNORMAL HIGH (ref 22–32)
Calcium: 7.9 mg/dL — ABNORMAL LOW (ref 8.9–10.3)
Chloride: 96 mmol/L — ABNORMAL LOW (ref 101–111)
Creatinine, Ser: 1.04 mg/dL (ref 0.61–1.24)
GFR calc Af Amer: >60 mL/min (ref >60)
GLUCOSE: 313 mg/dL — AB (ref 65–99)
POTASSIUM: 3.3 mmol/L — AB (ref 3.5–5.1)
Sodium: 139 mmol/L (ref 135–145)

## 2015-08-22 LAB — GLUCOSE, CAPILLARY
Glucose-Capillary: 320 mg/dL — ABNORMAL HIGH (ref 65–99)
Glucose-Capillary: 348 mg/dL — ABNORMAL HIGH (ref 65–99)
Glucose-Capillary: 129 mg/dL — ABNORMAL HIGH (ref 65–99)
Glucose-Capillary: 376 mg/dL — ABNORMAL HIGH (ref 65–99)

## 2015-08-22 LAB — MAGNESIUM: Magnesium: 2.2 mg/dL (ref 1.7–2.4)

## 2015-08-22 LAB — CULTURE, BLOOD (ROUTINE X 2)
CULTURE: NO GROWTH
CULTURE: NO GROWTH

## 2015-08-22 LAB — IRON AND TIBC
Iron: 18 ug/dL — ABNORMAL LOW (ref 45–182)
Saturation Ratios: 6 % — ABNORMAL LOW (ref 17.9–39.5)
TIBC: 311 ug/dL (ref 250–450)
UIBC: 293 ug/dL

## 2015-08-22 LAB — RETICULOCYTES
RBC.: 3.22 MIL/uL — AB (ref 4.22–5.81)
RETIC COUNT ABSOLUTE: 109.5 10*3/uL (ref 19.0–186.0)
Retic Ct Pct: 3.4 % — ABNORMAL HIGH (ref 0.4–3.1)

## 2015-08-22 LAB — VITAMIN B12: Vitamin B-12: 655 pg/mL (ref 180–914)

## 2015-08-22 LAB — FOLATE: Folate: 15.6 ng/mL (ref >5.9)

## 2015-08-22 MED ORDER — RESOURCE THICKENUP CLEAR PO POWD
ORAL | Status: DC | PRN
  Filled 2015-08-22 (×2): qty 125

## 2015-08-22 MED ORDER — POTASSIUM CHLORIDE CRYS ER 20 MEQ PO TBCR
40.0000 meq | EXTENDED_RELEASE_TABLET | Freq: Two times a day (BID) | ORAL | Status: DC
  Administered 2015-08-22 – 2015-08-25 (×7): 40 meq via ORAL
  Filled 2015-08-22 (×7): qty 2

## 2015-08-22 NOTE — Progress Notes (Signed)
Subjective:  Continues to feel some better from breathing standpoint but not at baseline. Denies abdominal pain, vomiting. Appetite is good.   Objective: Vital signs in last 24 hours: Temp:  [97.1 F (36.2 C)-98.9 F (37.2 C)] 98.6 F (37 C) (02/08 0400) Pulse Rate:  [49-92] 58 (02/08 0600) Resp:  [15-23] 16 (02/08 0600) BP: (110-145)/(65-85) 121/76 mmHg (02/08 0600) SpO2:  [95 %-100 %] 99 % (02/08 0600) FiO2 (%):  [55 %] 55 % (02/08 0400) Weight:  [161 lb 2.5 oz (73.1 kg)] 161 lb 2.5 oz (73.1 kg) (02/08 0500) Last BM Date: 08/21/15 General:   Alert,  Well-developed, well-nourished, pleasant and cooperative in NAD Head:  Normocephalic and atraumatic. Eyes:  Sclera clear, no icterus.  Chest: diminished breath sounds and scattered rhonchi.    Heart:  Regular rate and rhythm; no murmurs, clicks, rubs,  or gallops. Abdomen:  Soft, nontender and nondistended.   Normal bowel sounds, without guarding, and without rebound.   Extremities:  Without clubbing, deformity or edema. Neurologic:  Alert and  oriented x4;  grossly normal neurologically. Skin:  Intact without significant lesions or rashes. Psych:  Alert and cooperative. Normal mood and affect.  Intake/Output from previous day: 02/07 0701 - 02/08 0700 In: 2733 [P.O.:480; I.V.:1803; IV Piggyback:450] Out: 4259 [Urine:4050] Intake/Output this shift:    Lab Results: CBC  Recent Labs  08/21/15 0545 08/22/15 0515  WBC 6.5 7.4  HGB 7.6* 7.6*  HCT 24.8* 24.6*  MCV 76.3* 76.4*  PLT 253 268   BMET  Recent Labs  08/20/15 0521 08/21/15 0545 08/22/15 0515  NA 139 141 139  K 3.4* 3.2* 3.3*  CL 99* 97* 96*  CO2 31 33* 34*  GLUCOSE 386* 323* 313*  BUN 35* 36* 34*  CREATININE 1.24 1.25* 1.04  CALCIUM 7.7* 8.0* 7.9*   LFTs  Recent Labs  08/20/15 0521 08/21/15 0545  BILITOT 0.3 0.4  BILIDIR 0.1 0.1  IBILI 0.2* 0.3  ALKPHOS 75 71  AST 63* 46*  ALT 234* 187*  PROT 5.8* 5.9*  ALBUMIN 2.3* 2.3*   No results for  input(s): LIPASE in the last 72 hours. PT/INR  Recent Labs  08/20/15 0521  LABPROT 16.0*  INR 1.26      Imaging Studies: US Abdomen Complete  08/20/2015  CLINICAL DATA:  Elevated LFTs, upper abdominal pain EXAM: ABDOMEN ULTRASOUND COMPLETE COMPARISON:  CT abdomen pelvis dated 05/01/2015 FINDINGS: Gallbladder: No gallstones, gallbladder wall thickening, or pericholecystic fluid. Negative sonographic Murphy's sign. Common bile duct: Diameter: 4 mm Liver: Coarse hepatic echotexture with hyperechoic hepatic parenchyma, nonspecific but likely reflecting hepatic steatosis. No focal hepatic lesion is seen. IVC: No abnormality visualized. Pancreas: Visualized portion unremarkable. Spleen: Size and appearance within normal limits. Right Kidney: Length: 12.3 cm.  No mass or hydronephrosis. Left Kidney: Length: 12.7 cm.  No mass or hydronephrosis. Abdominal aorta: No aneurysm visualized. Other findings: None. IMPRESSION: Suspected hepatic steatosis. Otherwise negative abdominal ultrasound. Electronically Signed   By: Julian Hy M.D.   On: 08/20/2015 11:56   US Venous Img Lower Bilateral  08/20/2015  CLINICAL DATA:  Lower extremity swelling for 1 month EXAM: BILATERAL LOWER EXTREMITY VENOUS DOPPLER ULTRASOUND TECHNIQUE: Gray-scale sonography with graded compression, as well as color Doppler and duplex ultrasound were performed to evaluate the lower extremity deep venous systems from the level of the common femoral vein and including the common femoral, femoral, profunda femoral, popliteal and calf veins including the posterior tibial, peroneal and gastrocnemius veins when visible. The superficial great  saphenous vein was also interrogated. Spectral Doppler was utilized to evaluate flow at rest and with distal augmentation maneuvers in the common femoral, femoral and popliteal veins. COMPARISON:  None. FINDINGS: RIGHT LOWER EXTREMITY Common Femoral Vein: No evidence of thrombus. Normal compressibility,  respiratory phasicity and response to augmentation. Saphenofemoral Junction: No evidence of thrombus. Normal compressibility and flow on color Doppler imaging. Profunda Femoral Vein: No evidence of thrombus. Normal compressibility and flow on color Doppler imaging. Femoral Vein: No evidence of thrombus. Normal compressibility, respiratory phasicity and response to augmentation. Popliteal Vein: No evidence of thrombus. Normal compressibility, respiratory phasicity and response to augmentation. Calf Veins: No evidence of thrombus. Normal compressibility and flow on color Doppler imaging. Superficial Great Saphenous Vein: No evidence of thrombus. Normal compressibility and flow on color Doppler imaging. Venous Reflux:  None. Other Findings:  Mild subcutaneous edema is noted. LEFT LOWER EXTREMITY Common Femoral Vein: No evidence of thrombus. Normal compressibility, respiratory phasicity and response to augmentation. Saphenofemoral Junction: No evidence of thrombus. Normal compressibility and flow on color Doppler imaging. Profunda Femoral Vein: No evidence of thrombus. Normal compressibility and flow on color Doppler imaging. Femoral Vein: No evidence of thrombus. Normal compressibility, respiratory phasicity and response to augmentation. Popliteal Vein: No evidence of thrombus. Normal compressibility, respiratory phasicity and response to augmentation. Calf Veins: No evidence of thrombus. Normal compressibility and flow on color Doppler imaging. Superficial Great Saphenous Vein: No evidence of thrombus. Normal compressibility and flow on color Doppler imaging. Venous Reflux:  None. Other Findings: Mild subcutaneous edema is noted. 3.9 cm lymph node is noted in the left groin. This is new from the prior exam of October 2016. IMPRESSION: No evidence of deep venous thrombosis. Bilateral lower extremity subcutaneous edema. Prominent lymph node in the left inguinal region likely reactive in nature. Clinical correlation is  recommended. Electronically Signed   By: Inez Catalina M.D.   On: 08/20/2015 11:49   Dg Chest Portable 1 View  08/17/2015  CLINICAL DATA:  Shortness of breath. Pneumonia. Subsequent encounter. EXAM: PORTABLE CHEST 1 VIEW COMPARISON:  Single view of the chest 07/26/2015. FINDINGS: Endotracheal tube and NG tube been removed. Small right pleural effusion is decreased in size since the comparison study. There is streaky right basilar airspace disease. Mild left basilar atelectasis is unchanged. The lungs are markedly emphysematous. Scar in the left upper lobe is not well demonstrated due to overlying tubes. No pneumothorax. Heart size normal. IMPRESSION: Decreased right pleural effusion with persistent streaky right basilar airspace opacity which is worrisome for pneumonia. Emphysema. Electronically Signed   By: Inge Rise M.D.   On: 08/17/2015 11:17   Portable Chest Xray  07/26/2015  CLINICAL DATA:  Intubation. EXAM: PORTABLE CHEST 1 VIEW COMPARISON:  07/25/2015. FINDINGS: Endotracheal tube and NG tube in stable position. Mediastinum and hilar structures normal. Heart size normal. COPD . Improving infiltrate right lower lobe. Persistent small pleural effusion. No pneumothorax. IMPRESSION: 1. Lines and tubes in stable position . 2. Interim slight improvement of right lower lobe infiltrate. Persistent small right pleural effusion. Electronically Signed   By: Marcello Moores  Register   On: 07/26/2015 07:50   Dg Chest Port 1 View  07/25/2015  CLINICAL DATA:  Intubated patient, shortness of breath EXAM: PORTABLE CHEST 1 VIEW COMPARISON:  Chest x-ray of July 15, 2015, and chest CT scan dated May 01, 2015. FINDINGS: The lungs are well-expanded. There is confluent interstitial infiltrate in the right lower lobe. The right hemidiaphragm is now obscured. There is patchy interstitial density  at the left lung base which is stable. The heart is normal in size. The pulmonary vascularity is prominent centrally but there is  no pulmonary vascular congestion. The endotracheal tube tip lies approximately 5.7 cm above the carina. IMPRESSION: Bullous emphysema. Worsening of interstitial infiltrate in the right lower lung likely reflecting pneumonia. Stable interstitial density at the left lung base. The endotracheal tube is in reasonable position. Electronically Signed   By: Eugen  Martinique M.D.   On: 07/25/2015 08:16   Dg Foot 2 Views Left  08/20/2015  CLINICAL DATA:  LEFT foot pain and swelling EXAM: LEFT FOOT - 2 VIEW COMPARISON:  Portable exam 1413 hours compared to 05/01/2015 FINDINGS: Bones appear demineralized. Joint spaces preserved. Displaced fracture fragment identified at distal lateral aspect of the calcaneus at calcaneal cuboid joint. No additional fracture, dislocation or bone destruction. Diffuse soft tissue swelling LEFT foot greatest at dorsum. IMPRESSION: Subacute distal LEFT calcaneal fracture extending intact calcaneocuboid joint. Marked soft tissue swelling without additional acute bony abnormalities. Electronically Signed   By: Lavonia Dana M.D.   On: 08/20/2015 14:33  [2 weeks]   Assessment: 55 year old male with multiple comorbidities, presenting this admission with probable pneumonia, acute on chronic respiratory failure in the setting of severe COPD at baseline. GI consulted due to anemia, likely IDA component and multifactorial in the setting of chronic disease. He has no overt GI bleeding whatsoever; negative hemoccult in ED. Notable, his Hgb was in the 5 range on this admission, requiring 2 units PRBCs. He has been maintaining in the 7 range currently. Upon review of historical labs, evidence of drifting Hgb began in Oct 2016; he had been maintaining a value of around 10, so this presentation is new. In the setting of chronic anticoagulation [facility]), concern remains for stuttering GI bleed anywhere in the GI tract. EGD fairly recent, as of Oct 2016 by Dr. Michail Sermon. However, upon further evaluation of  procedure note, he had food in his stomach, which precluded complete evaluation of upper GI tract. He also has a history of Barrett's esophagus noted in 2011; recent EGD in 2016 without any biopsies completed. Last colonoscopy in 2012 normal.   He will definitely need GI evaluation due to new onset anemia; at this point, would recommend a colonoscopy with repeat EGD for complete evaluation of upper GI tract and biopsy, as prior EGD was not entirely complete. Capsule study could be considered if no obvious source for anemia at a later date. For now, he is not stable from a respiratory standpoint to undergo a procedure. Will continue to follow.   Elevated LFTs: in setting of fatty liver. He denies any prior known history of chronic liver disease. [place] without ascites. He has notable lower extremity edema on exam. ECHO reviewed from Jan 2017 with EF of 60-65%. No stigmata of portal hypertension on recent EGD. Overall, LFTs continue to improve.    Plan: 1. Colonoscopy/egd outpatient vs inpatient (once respiratory status is markedly improved).  2. Follow up remaining serologies.  3. Continue PPI.   Laureen Ochs. Bernarda Caffey [facility] [phone] 2/8/201710:52 AM     LOS: 5 days

## 2015-08-22 NOTE — Progress Notes (Signed)
Pharmacy Antibiotic Note  Ryan [name] is a 55 y.o. male admitted on 08/17/2015 with pneumonia.  Pharmacy has been consulted for vancomycin and renal dose antibiotics.   His vanc trough was 24 mg/L .  Afebrile.  SCr stable.  MRSA PCR (-) Blood cx (-)  Plan: vancomycin to 750 mg IV q12 hours (dose adjusted due to high trough) Cont cefepime 1gm IV q8h Duration of therapy per MD  Height: '5\' 8"'$  (172.7 cm) Weight: 161 lb 2.5 oz (73.1 kg) IBW/kg (Calculated) : 68.4  Temp (24hrs), Avg:98.1 F (36.7 C), Min:97.1 F (36.2 C), Max:98.9 F (37.2 C)   Recent Labs Lab 08/17/15 1119 08/17/15 1157 08/18/15 0533 08/19/15 0037 08/19/15 0514 08/19/15 0817 08/20/15 0521 08/21/15 0545 08/22/15 0515  WBC  --  4.8 3.6*  --  5.9  --   --  6.5 7.4  CREATININE  --  1.23 1.46*  --  1.28*  --  1.24 1.25* 1.04  LATICACIDVEN 3.09*  --   --   --   --   --   --   --   --   VANCOTROUGH  --   --   --  43*  --  24*  --   --   --     Estimated Creatinine Clearance: 76.7 mL/min (by C-G formula based on Cr of 1.04).    Allergies  Allergen Reactions  . Albuterol Palpitations  . Influenza Vaccine Live Swelling   Antimicrobials this admission: He was on vancomycin 1250 mg IV q12 hours PTA for MRSA bacteremia with an expected stop date of 08/23/15.  Cefepime dose appropriate for renal function Cefepime 2/3 >>   Microbiology results: Previously being treated for MRSA bacteremia BC and sputum cultures are neg to date  Thank you for allowing pharmacy to be a part of this patient's care.  Hart Robinsons A 08/22/2015 2:00 PM

## 2015-08-22 NOTE — Progress Notes (Signed)
Called report to Tedd Sias, RN on dept 300. Verbalized understanding. Pt transferred to room 324 in safe and stable condition.

## 2015-08-22 NOTE — Progress Notes (Signed)
Inpatient Diabetes Program Recommendations  AACE/ADA: New Consensus Statement on Inpatient Glycemic Control (2015)  Target Ranges:  Prepandial:   less than 140 mg/dL      Peak postprandial:   less than 180 mg/dL (1-2 hours)      Critically ill patients:  140 - 180 mg/dL   Results for PASCUAL, MANTEL (MRN [id number]) as of 08/22/2015 11:02  Ref. Range 08/21/2015 07:33 08/21/2015 11:25 08/21/2015 16:00 08/21/2015 21:43 08/22/2015 08:04  Glucose-Capillary Latest Ref Range: 65-99 mg/dL 280 (H) 415 (H) 187 (H) 236 (H) 320 (H)   Review of Glycemic Control  Diabetes history: DM2 Outpatient Diabetes medications: Metformin 500 mg QAM, Novolog 0-20 units TID with meals Current orders for Inpatient glycemic control: Lantus 35 units daily, Novolog 0-20 units TID with meals, Novolog 0-5 units QHS, Metformin 500 mg QAM, Novolog 8 units TID with meals for meal coverage  Inpatient Diabetes Program Recommendations: Insulin - Basal: If steroids are continued as ordered (Solumedrol 60 mg Q12H), please consider increasing Lantus to 40 units daily. Insulin - Meal Coverage: If steroids are continued as ordered, please consider increasing meal coverage to Novolog 15 units TID with meals.  Thanks, Barnie Alderman, RN, MSN, CDE Diabetes Coordinator Inpatient Diabetes Program [phone] (Team Pager from [place] to [place]) [phone] ([facility] office) [phone] [facility] office) [phone] [facility] office)

## 2015-08-22 NOTE — Progress Notes (Signed)
Subjective: He feels a little better. He has no new complaints. Speech evaluation noted and appreciated. Cardiology evaluation noted and appreciated it is reassuring that he has not developed heart failure. His edema seems to be multifactorial. He says his breathing is better. No bleeding noted.  Objective: Vital signs in last 24 hours: Temp:  [97.1 F (36.2 C)-98.9 F (37.2 C)] 98.6 F (37 C) (02/08 0400) Pulse Rate:  [49-92] 58 (02/08 0600) Resp:  [15-23] 16 (02/08 0600) BP: (110-145)/(65-85) 121/76 mmHg (02/08 0600) SpO2:  [95 %-100 %] 99 % (02/08 0600) FiO2 (%):  [55 %] 55 % (02/08 0400) Weight:  [73.1 kg (161 lb 2.5 oz)] 73.1 kg (161 lb 2.5 oz) (02/08 0500) Weight change: 1.7 kg (3 lb 12 oz) Last BM Date: 08/21/15  Intake/Output from previous day: 02/07 0701 - 02/08 0700 In: 2733 [P.O.:480; I.V.:1803; IV Piggyback:450] Out: 4050 [Urine:4050]  PHYSICAL EXAM General appearance: alert, cooperative and mild distress Resp: rhonchi bilaterally Cardio: His heart is slow this morning and per monitor while he is sleeping it looks like he has a junctional rhythm at about 50 GI: soft, non-tender; bowel sounds normal; no masses,  no organomegaly Extremities: Bilateral edema 1+ now  Lab Results:  Results for orders placed or performed during the hospital encounter of 08/17/15 (from the past 48 hour(s))  Glucose, capillary     Status: Abnormal   Collection Time: 08/20/15 11:33 AM  Result Value Ref Range   Glucose-Capillary 267 (H) 65 - 99 mg/dL   Comment 1 Notify RN    Comment 2 Document in Chart   Hepatitis B surface antigen     Status: None   Collection Time: 08/20/15  1:44 PM  Result Value Ref Range   Hepatitis B Surface Ag Negative Negative    Comment: (NOTE) Performed At: [facility] [place], [place] [id number] Lindon Romp MD [id number]   Hepatitis C antibody     Status: None   Collection Time: 08/20/15  1:44 PM  Result Value Ref Range    HCV Ab <0.1 0.0 - 0.9 s/co ratio    Comment: (NOTE)                                  Negative:     < 0.8                             Indeterminate: 0.8 - 0.9                                  Positive:     > 0.9 The CDC recommends that a positive HCV antibody result be followed up with a HCV Nucleic Acid Amplification test (426834). Performed At: [facility] [place], [place] [id number] Lindon Romp MD [id number]   Mitochondrial antibodies     Status: None   Collection Time: 08/20/15  1:44 PM  Result Value Ref Range   Mitochondrial M2 Ab, IgG 6.8 0.0 - 20.0 Units    Comment: (NOTE)                                Negative    0.0 - 20.0  Equivocal  20.1 - 24.9                                Positive         >24.9 Mitochondrial (M2) Antibodies are found in 90-96% of patients with primary biliary cirrhosis. Performed At: [facility] [place], [place] [id number] Lindon Romp MD [id number]   Anti-smooth muscle antibody, IgG     Status: None   Collection Time: 08/20/15  1:44 PM  Result Value Ref Range   F-Actin IgG 12 0 - 19 Units    Comment: (NOTE)                 Negative                     0 - 19                 Weak positive               20 - 30                 Moderate to strong positive     >30 Actin Antibodies are found in 52-85% of patients with autoimmune hepatitis or chronic active hepatitis and in 22% of patients with primary biliary cirrhosis. Performed At: [facility] [address] [place], [place] [id number] Lindon Romp MD [id number]   Antinuclear Antibodies, IFA     Status: None   Collection Time: 08/20/15  1:44 PM  Result Value Ref Range   ANA Ab, IFA Negative     Comment: (NOTE)                                     Negative   <1:80                                     Borderline  1:80                                     Positive   >1:80 Performed At: [facility] [address] [place], [place] [id number] Lindon Romp MD [id number]   Alpha-1-antitrypsin     Status: Abnormal   Collection Time: 08/20/15  1:44 PM  Result Value Ref Range   A-1 Antitrypsin, Ser 277 (H) 90 - 200 mg/dL    Comment: (NOTE) Performed At: [facility] [address] [place], [place] [id number] Lindon Romp MD [id number]   Ceruloplasmin     Status: Abnormal   Collection Time: 08/20/15  1:44 PM  Result Value Ref Range   Ceruloplasmin 51.4 (H) 16.0 - 31.0 mg/dL    Comment: (NOTE) Performed At: [facility] [place], [place] [id number] Lindon Romp MD [id number]   Glucose, capillary     Status: Abnormal   Collection Time: 08/20/15  5:03 PM  Result Value Ref Range   Glucose-Capillary 121 (H) 65 - 99 mg/dL   Comment 1 Notify RN    Comment 2 Document in Chart   Glucose, capillary     Status: Abnormal   Collection Time: 08/20/15  9:06 PM  Result Value Ref  Range   Glucose-Capillary 404 (H) 65 - 99 mg/dL  CBC with Differential/Platelet     Status: Abnormal   Collection Time: 08/21/15  5:45 AM  Result Value Ref Range   WBC 6.5 4.0 - 10.5 K/uL   RBC 3.25 (L) 4.22 - 5.81 MIL/uL   Hemoglobin 7.6 (L) 13.0 - 17.0 g/dL   HCT 24.8 (L) 39.0 - 52.0 %   MCV 76.3 (L) 78.0 - 100.0 fL   MCH 23.4 (L) 26.0 - 34.0 pg   MCHC 30.6 30.0 - 36.0 g/dL   RDW 21.7 (H) 11.5 - 15.5 %   Platelets 253 150 - 400 K/uL   Neutrophils Relative % 68 %   Neutro Abs 4.5 1.7 - 7.7 K/uL   Lymphocytes Relative 18 %   Lymphs Abs 1.2 0.7 - 4.0 K/uL   Monocytes Relative 13 %   Monocytes Absolute 0.8 0.1 - 1.0 K/uL   Eosinophils Relative 0 %   Eosinophils Absolute 0.0 0.0 - 0.7 K/uL   Basophils Relative 1 %   Basophils Absolute 0.0 0.0 - 0.1 K/uL  Basic metabolic panel     Status: Abnormal   Collection Time: 08/21/15  5:45 AM  Result Value Ref Range   Sodium 141 135 - 145 mmol/L   Potassium 3.2 (L) 3.5 - 5.1 mmol/L   Chloride 97  (L) 101 - 111 mmol/L   CO2 33 (H) 22 - 32 mmol/L   Glucose, Bld 323 (H) 65 - 99 mg/dL   BUN 36 (H) 6 - 20 mg/dL   Creatinine, Ser 1.25 (H) 0.61 - 1.24 mg/dL   Calcium 8.0 (L) 8.9 - 10.3 mg/dL   GFR calc non Af Amer >60 >60 mL/min   GFR calc Af Amer >60 >60 mL/min    Comment: (NOTE) The eGFR has been calculated using the CKD EPI equation. This calculation has not been validated in all clinical situations. eGFR's persistently <60 mL/min signify possible Chronic Kidney Disease.    Anion gap 11 5 - 15  Hepatic function panel     Status: Abnormal   Collection Time: 08/21/15  5:45 AM  Result Value Ref Range   Total Protein 5.9 (L) 6.5 - 8.1 g/dL   Albumin 2.3 (L) 3.5 - 5.0 g/dL   AST 46 (H) 15 - 41 U/L   ALT 187 (H) 17 - 63 U/L   Alkaline Phosphatase 71 38 - 126 U/L   Total Bilirubin 0.4 0.3 - 1.2 mg/dL   Bilirubin, Direct 0.1 0.1 - 0.5 mg/dL   Indirect Bilirubin 0.3 0.3 - 0.9 mg/dL  Glucose, capillary     Status: Abnormal   Collection Time: 08/21/15  7:33 AM  Result Value Ref Range   Glucose-Capillary 280 (H) 65 - 99 mg/dL   Comment 1 Notify RN    Comment 2 Document in Chart   Glucose, capillary     Status: Abnormal   Collection Time: 08/21/15 11:25 AM  Result Value Ref Range   Glucose-Capillary 415 (H) 65 - 99 mg/dL   Comment 1 Notify RN    Comment 2 Document in Chart   Glucose, capillary     Status: Abnormal   Collection Time: 08/21/15  4:00 PM  Result Value Ref Range   Glucose-Capillary 187 (H) 65 - 99 mg/dL   Comment 1 Notify RN   Glucose, capillary     Status: Abnormal   Collection Time: 08/21/15  9:43 PM  Result Value Ref Range   Glucose-Capillary 236 (H) 65 -  99 mg/dL  CBC with Differential/Platelet     Status: Abnormal   Collection Time: 08/22/15  5:15 AM  Result Value Ref Range   WBC 7.4 4.0 - 10.5 K/uL   RBC 3.22 (L) 4.22 - 5.81 MIL/uL   Hemoglobin 7.6 (L) 13.0 - 17.0 g/dL   HCT 24.6 (L) 39.0 - 52.0 %   MCV 76.4 (L) 78.0 - 100.0 fL   MCH 23.6 (L) 26.0 -  34.0 pg   MCHC 30.9 30.0 - 36.0 g/dL   RDW 21.5 (H) 11.5 - 15.5 %   Platelets 268 150 - 400 K/uL   Neutrophils Relative % 74 %   Neutro Abs 5.5 1.7 - 7.7 K/uL   Lymphocytes Relative 14 %   Lymphs Abs 1.0 0.7 - 4.0 K/uL   Monocytes Relative 12 %   Monocytes Absolute 0.9 0.1 - 1.0 K/uL   Eosinophils Relative 0 %   Eosinophils Absolute 0.0 0.0 - 0.7 K/uL   Basophils Relative 0 %   Basophils Absolute 0.0 0.0 - 0.1 K/uL  Basic metabolic panel     Status: Abnormal   Collection Time: 08/22/15  5:15 AM  Result Value Ref Range   Sodium 139 135 - 145 mmol/L   Potassium 3.3 (L) 3.5 - 5.1 mmol/L   Chloride 96 (L) 101 - 111 mmol/L   CO2 34 (H) 22 - 32 mmol/L   Glucose, Bld 313 (H) 65 - 99 mg/dL   BUN 34 (H) 6 - 20 mg/dL   Creatinine, Ser 1.04 0.61 - 1.24 mg/dL   Calcium 7.9 (L) 8.9 - 10.3 mg/dL   GFR calc non Af Amer >60 >60 mL/min   GFR calc Af Amer >60 >60 mL/min    Comment: (NOTE) The eGFR has been calculated using the CKD EPI equation. This calculation has not been validated in all clinical situations. eGFR's persistently <60 mL/min signify possible Chronic Kidney Disease.    Anion gap 9 5 - 15  Magnesium     Status: None   Collection Time: 08/22/15  5:15 AM  Result Value Ref Range   Magnesium 2.2 1.7 - 2.4 mg/dL  Reticulocytes     Status: Abnormal   Collection Time: 08/22/15  5:15 AM  Result Value Ref Range   Retic Ct Pct 3.4 (H) 0.4 - 3.1 %   RBC. 3.22 (L) 4.22 - 5.81 MIL/uL   Retic Count, Manual 109.5 19.0 - 186.0 K/uL    ABGS No results for input(s): PHART, PO2ART, TCO2, HCO3 in the last 72 hours.  Invalid input(s): PCO2 CULTURES Recent Results (from the past 240 hour(s))  Culture, blood (routine x 2) Call MD if unable to obtain prior to antibiotics being given     Status: None (Preliminary result)   Collection Time: 08/17/15  2:21 PM  Result Value Ref Range Status   Specimen Description BLOOD RIGHT ANTECUBITAL  Final   Special Requests BOTTLES DRAWN AEROBIC AND  ANAEROBIC 6CC  Final   Culture NO GROWTH 4 DAYS  Final   Report Status PENDING  Incomplete  Culture, blood (routine x 2) Call MD if unable to obtain prior to antibiotics being given     Status: None (Preliminary result)   Collection Time: 08/17/15  2:28 PM  Result Value Ref Range Status   Specimen Description BLOOD LEFT HAND  Final   Special Requests BOTTLES DRAWN AEROBIC AND ANAEROBIC 6CC  Final   Culture NO GROWTH 4 DAYS  Final   Report Status PENDING  Incomplete  MRSA PCR Screening     Status: None   Collection Time: 08/17/15  7:00 PM  Result Value Ref Range Status   MRSA by PCR NEGATIVE NEGATIVE Final    Comment:        The GeneXpert MRSA Assay (FDA approved for NASAL specimens only), is one component of a comprehensive MRSA colonization surveillance program. It is not intended to diagnose MRSA infection nor to guide or monitor treatment for MRSA infections.   Culture, respiratory (NON-Expectorated)     Status: None   Collection Time: 08/18/15  4:16 PM  Result Value Ref Range Status   Specimen Description SPUTUM EXPECTORATED  Final   Special Requests NONE  Final   Gram Stain   Final    RARE WBC PRESENT, PREDOMINANTLY PMN FEW SQUAMOUS EPITHELIAL CELLS PRESENT RARE GRAM POSITIVE COCCI IN PAIRS Performed at [facility]    Culture   Final    NORMAL OROPHARYNGEAL FLORA Performed at [facility]    Report Status 08/21/2015 FINAL  Final   Studies/Results: US Abdomen Complete  08/20/2015  CLINICAL DATA:  Elevated LFTs, upper abdominal pain EXAM: ABDOMEN ULTRASOUND COMPLETE COMPARISON:  CT abdomen pelvis dated 05/01/2015 FINDINGS: Gallbladder: No gallstones, gallbladder wall thickening, or pericholecystic fluid. Negative sonographic Murphy's sign. Common bile duct: Diameter: 4 mm Liver: Coarse hepatic echotexture with hyperechoic hepatic parenchyma, nonspecific but likely reflecting hepatic steatosis. No focal hepatic lesion is seen. IVC: No abnormality  visualized. Pancreas: Visualized portion unremarkable. Spleen: Size and appearance within normal limits. Right Kidney: Length: 12.3 cm.  No mass or hydronephrosis. Left Kidney: Length: 12.7 cm.  No mass or hydronephrosis. Abdominal aorta: No aneurysm visualized. Other findings: None. IMPRESSION: Suspected hepatic steatosis. Otherwise negative abdominal ultrasound. Electronically Signed   By: Julian Hy M.D.   On: 08/20/2015 11:56   US Venous Img Lower Bilateral  08/20/2015  CLINICAL DATA:  Lower extremity swelling for 1 month EXAM: BILATERAL LOWER EXTREMITY VENOUS DOPPLER ULTRASOUND TECHNIQUE: Gray-scale sonography with graded compression, as well as color Doppler and duplex ultrasound were performed to evaluate the lower extremity deep venous systems from the level of the common femoral vein and including the common femoral, femoral, profunda femoral, popliteal and calf veins including the posterior tibial, peroneal and gastrocnemius veins when visible. The superficial great saphenous vein was also interrogated. Spectral Doppler was utilized to evaluate flow at rest and with distal augmentation maneuvers in the common femoral, femoral and popliteal veins. COMPARISON:  None. FINDINGS: RIGHT LOWER EXTREMITY Common Femoral Vein: No evidence of thrombus. Normal compressibility, respiratory phasicity and response to augmentation. Saphenofemoral Junction: No evidence of thrombus. Normal compressibility and flow on color Doppler imaging. Profunda Femoral Vein: No evidence of thrombus. Normal compressibility and flow on color Doppler imaging. Femoral Vein: No evidence of thrombus. Normal compressibility, respiratory phasicity and response to augmentation. Popliteal Vein: No evidence of thrombus. Normal compressibility, respiratory phasicity and response to augmentation. Calf Veins: No evidence of thrombus. Normal compressibility and flow on color Doppler imaging. Superficial Great Saphenous Vein: No evidence of  thrombus. Normal compressibility and flow on color Doppler imaging. Venous Reflux:  None. Other Findings:  Mild subcutaneous edema is noted. LEFT LOWER EXTREMITY Common Femoral Vein: No evidence of thrombus. Normal compressibility, respiratory phasicity and response to augmentation. Saphenofemoral Junction: No evidence of thrombus. Normal compressibility and flow on color Doppler imaging. Profunda Femoral Vein: No evidence of thrombus. Normal compressibility and flow on color Doppler imaging. Femoral Vein: No evidence of thrombus. Normal compressibility, respiratory phasicity  and response to augmentation. Popliteal Vein: No evidence of thrombus. Normal compressibility, respiratory phasicity and response to augmentation. Calf Veins: No evidence of thrombus. Normal compressibility and flow on color Doppler imaging. Superficial Great Saphenous Vein: No evidence of thrombus. Normal compressibility and flow on color Doppler imaging. Venous Reflux:  None. Other Findings: Mild subcutaneous edema is noted. 3.9 cm lymph node is noted in the left groin. This is new from the prior exam of October 2016. IMPRESSION: No evidence of deep venous thrombosis. Bilateral lower extremity subcutaneous edema. Prominent lymph node in the left inguinal region likely reactive in nature. Clinical correlation is recommended. Electronically Signed   By: Inez Catalina M.D.   On: 08/20/2015 11:49   Dg Foot 2 Views Left  08/20/2015  CLINICAL DATA:  LEFT foot pain and swelling EXAM: LEFT FOOT - 2 VIEW COMPARISON:  Portable exam 1413 hours compared to 05/01/2015 FINDINGS: Bones appear demineralized. Joint spaces preserved. Displaced fracture fragment identified at distal lateral aspect of the calcaneus at calcaneal cuboid joint. No additional fracture, dislocation or bone destruction. Diffuse soft tissue swelling LEFT foot greatest at dorsum. IMPRESSION: Subacute distal LEFT calcaneal fracture extending intact calcaneocuboid joint. Marked soft  tissue swelling without additional acute bony abnormalities. Electronically Signed   By: Lavonia Dana M.D.   On: 08/20/2015 14:33    Medications:  Prior to Admission:  Prescriptions prior to admission  Medication Sig Dispense Refill Last Dose  . albuterol (VENTOLIN HFA) 108 (90 BASE) MCG/ACT inhaler Inhale 2 puffs into the lungs every 6 (six) hours as needed for wheezing or shortness of breath.   Past Week at Unknown time  . baclofen (LIORESAL) 10 MG tablet Take 10 mg by mouth 3 (three) times daily.    08/17/2015 at Unknown time  . benzonatate (TESSALON) 200 MG capsule Take 200 mg by mouth 3 (three) times daily.   08/17/2015 at Unknown time  . Dextromethorphan-Guaifenesin 20-400 MG TABS Take 1 tablet by mouth every morning.    08/16/2015 at Unknown time  . diltiazem (CARDIZEM CD) 120 MG 24 hr capsule Take 1 capsule (120 mg total) by mouth daily. 30 capsule 11 08/17/2015 at Unknown time  . diphenhydrAMINE (BENADRYL) 25 MG tablet Take 50 mg by mouth every morning.   08/17/2015 at Unknown time  . edoxaban (SAVAYSA) 60 MG TABS tablet Take 60 mg by mouth daily. 30 tablet 11 08/17/2015 at Unknown time  . fluticasone (FLONASE) 50 MCG/ACT nasal spray Place 2 sprays into both nostrils daily as needed for allergies.    Past Month at Unknown time  . furosemide (LASIX) 40 MG tablet Take 40 mg by mouth 2 (two) times daily.   08/17/2015 at Unknown time  . insulin aspart (NOVOLOG) 100 UNIT/ML injection Inject 0-20 Units into the skin 3 (three) times daily with meals. 10 mL 11 08/17/2015 at Unknown time  . levalbuterol (XOPENEX) 0.63 MG/3ML nebulizer solution Take 3 mLs (0.63 mg total) by nebulization 3 (three) times daily. (Patient taking differently: Take 0.63 mg by nebulization every 8 (eight) hours as needed for wheezing or shortness of breath. ) 3 mL 12 Past Week at Unknown time  . LORazepam (ATIVAN) 0.5 MG tablet Take 1 tablet (0.5 mg total) by mouth 2 (two) times daily as needed for anxiety or sleep. (Patient taking  differently: Take 0.5 mg by mouth 4 (four) times daily as needed for anxiety or sleep. ) 20 tablet 0 08/17/2015 at Unknown time  . Magnesium 500 MG TABS Take 500 mg by  mouth daily.   08/17/2015 at Unknown time  . metFORMIN (GLUCOPHAGE) 500 MG tablet Take 1 tablet (500 mg total) by mouth daily with breakfast. 30 tablet 3 08/17/2015 at Unknown time  . montelukast (SINGULAIR) 10 MG tablet Take 10 mg by mouth at bedtime.   08/16/2015 at Unknown time  . nicotine (NICODERM CQ - DOSED IN MG/24 HOURS) 14 mg/24hr patch Place 14 mg onto the skin daily.   08/16/2015 at Unknown time  . nitroGLYCERIN (NITROSTAT) 0.4 MG SL tablet Place 1 tablet (0.4 mg total) under the tongue every 5 (five) minutes as needed for chest pain. 25 tablet 4 unknown  . oxyCODONE-acetaminophen (PERCOCET) 10-325 MG tablet Take 1 tablet by mouth every 4 (four) hours as needed for pain.   08/16/2015 at Unknown time  . pantoprazole (PROTONIX) 40 MG tablet Take 1 tablet (40 mg total) by mouth 2 (two) times daily. 60 tablet 0 08/17/2015 at Unknown time  . potassium chloride SA (K-DUR,KLOR-CON) 20 MEQ tablet Take 20 mEq by mouth 2 (two) times daily.   08/17/2015 at Unknown time  . SPIRIVA RESPIMAT 2.5 MCG/ACT AERS Inhale 1 puff into the lungs daily.   08/17/2015 at Unknown time  . vancomycin in sodium chloride 0.9 % 250 mL Inject 1,250 mg into the vein every 12 (twelve) hours. Approximate time of 75 minutes per infusion   08/17/2015 at 830a  . nicotine (NICODERM CQ - DOSED IN MG/24 HOURS) 21 mg/24hr patch Place 1 patch (21 mg total) onto the skin daily. (Patient not taking: Reported on 08/17/2015) 28 patch 0 Taking  . PHARMACIST CHOICE LANCETS MISC USE 2 TIMES DAILY FOR DIABETIC TESTING 200 each 0   . predniSONE (DELTASONE) 10 MG tablet Take 4 tablets (40 mg total) by mouth daily with breakfast. (Patient not taking: Reported on 08/17/2015) 30 tablet 1 Completed Course at Unknown time   Scheduled: . sodium chloride   Intravenous Once  . baclofen  10 mg Oral TID  .  benzonatate  200 mg Oral TID  . ceFEPime (MAXIPIME) IV  1 g Intravenous 3 times per day  . diltiazem  120 mg Oral Daily  . furosemide  40 mg Oral BID  . insulin aspart  0-20 Units Subcutaneous TID WC  . insulin aspart  0-5 Units Subcutaneous QHS  . insulin aspart  8 Units Subcutaneous TID WC  . insulin glargine  35 Units Subcutaneous Daily  . levalbuterol  0.63 mg Nebulization Q6H  . metFORMIN  500 mg Oral Q breakfast  . methylPREDNISolone (SOLU-MEDROL) injection  60 mg Intravenous Q12H  . nicotine  14 mg Transdermal Daily  . oxyCODONE-acetaminophen  1 tablet Oral Q4H  . pantoprazole  40 mg Oral Daily  . potassium chloride  20 mEq Oral BID  . sodium chloride flush  3 mL Intravenous Q12H  . vancomycin  750 mg Intravenous Q12H   Continuous: . sodium chloride 75 mL/hr at 08/22/15 0600   QMG:[id number] **OR** acetaminophen, alum & mag hydroxide-simeth, levalbuterol, LORazepam  Assesment: He was admitted with acute hypoxic respiratory failure. This is on top of his chronic respiratory failure. He has severe COPD at baseline.  He has chronic atrial fibrillation and has been anticoagulated.  He has anemia presumably from GI bleeding.  He has malnutrition with low albumin and I think his edema is related to steroid use and his low protein state  He has elevated liver enzymes presumably from fatty infiltration of the liver  He has diabetes on insulin.  He is  at risk of aspiration Principal Problem:   Acute respiratory failure with hypoxia (HCC) Active Problems:   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  quit smoking 05/01/15    Type 2 diabetes mellitus without complication (HCC)   Atrial fibrillation with rapid ventricular response (HCC)   Chronic respiratory failure with hypoxia (HCC)   COPD (chronic obstructive pulmonary disease) (HCC)   Elevated liver enzymes   Absolute anemia    Plan: I think it's okay for him to transfer out of the ICU today.    LOS: 5 days   Ciera Beckum  L 08/22/2015, 8:02 AM

## 2015-08-22 NOTE — Procedures (Signed)
Objective Swallowing Evaluation: Type of Study: MBS-Modified Barium Swallow Study  Patient Details  Name: Ryan [name] MRN: [id number] Date of Birth: 11/30/1960  Today's Date: 08/22/2015 Time: SLP Start Time (ACUTE ONLY): 1310-SLP Stop Time (ACUTE ONLY): 1430 SLP Time Calculation (min) (ACUTE ONLY): 80 min  Past Medical History:  Past Medical History  Diagnosis Date  . COPD (chronic obstructive pulmonary disease) ([place])   . Hypercholesteremia   . History of pneumonia   . Polycythemia   . Coronary atherosclerosis of native coronary artery     Mild nonobstructive disease at catheterization 2007  . 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 Borderline diabetes   . Seizures ([place])     Last seizure 2 yrs ago  . GERD (gastroesophageal reflux disease)   . Type 2 diabetes mellitus ([place])   . DDD (degenerative disc disease)     Cervical and thoracic  . Chronic back pain   . Bronchitis   . Chronic left shoulder pain   . Chronic neck pain   . Atrial fibrillation ([place])     Not anticoagulated  . Smoker   . Type 2 diabetes mellitus without complication ([place])   . CAP (community acquired pneumonia) 07/10/2013    04/2015  . Collagen vascular disease ([place])   . Chronic respiratory failure ([place])   . Barrett's esophagus    Past Surgical History:  Past Surgical History  Procedure Laterality Date  . Colonoscopy  2012    Dr. Posey Pronto: normal  . Vasectomy    . Throat biopsy    . Lung biopsy    . Vasectomy  1987  . Esophagogastroduodenoscopy N/A 05/04/2015    Dr. Michail Sermon: minimal erosive esophagitis, small hiatal hernia. FOOD PRECLUDED A COMPLETE EXAM. No biopsies taken  . Esophagogastroduodenoscopy  2011    [place]: Barrett's    HPI: Ryan [name] is a 55 y.o. year old male with a history of COPD, presenting with increasing shortness of breath and hypoxia. O2 sats were 70% in the field prior to arrival. Findings of questionable pneumonia on chest xray. History  of MRSA bacteremia in Jan 2017, requiring prolonged admission. GI consulted due to IDA. Denies any overt GI bleeding to include melena, hematochezia, hematemesis. Hemoccult negative in ED. Very rare nausea. Has felt light-headed and dizzy. Notes chronic GERD, on Protonix BID as outpatient. No dysphagia. No abdominal pain. Denies diarrhea, constipation. He denies any weight loss. Has a good appetite. Notes lower extremity pitting edema and feels like his abdomen is distended as well. He recently had an EGD by Dr. Michail Sermon in Oct 2016 with minimal erosive esophagitis and small hiatal hernia. However, he had food in his stomach, which precluded complete evaluation of upper GI tract. History of Barrett's on EGD in 2011. Elevated LFTs; in setting of fatty liver. He denies any prior known history of chronic liver disease. GI following and may do EGD once stable from respiratory standpoint. Dr. Luan Pulling requested SLP for swallowing evaluation.  Subjective: Pt reports feeling ok  Assessment / Plan / Recommendation  CHL IP CLINICAL IMPRESSIONS 08/22/2015  Therapy Diagnosis Mild pharyngeal phase dysphagia  Clinical Impression Mild pharyngeal phase with min delay in swallow initiation and trace residuals in valleculae after the swallow with thins. Pt with one single episode of trace, silent aspiration of thins when he inhaled just after primary swallow and before secondary swallow elicited. Strong coughing episode did eventually occur after several seconds when aspirate dropped deeper into  trachea (suspected). Pt was challenged throughout the rest of the evaluation (straw sips, mixed consistencies, sequential swallows), however no further penetration or aspiration observed. Pt did continue to have mild vallecular residue (lingual coating dripped down) after thins, but no further aspiration visualized. Suspect pt has transient episodes of aspiration over the course of a meal and possibly with pooled secretions at times  given pt/wife report of strong coughing episodes where he cannot catch his breath for several minutes. Many pt's with severe COPD have decreased sensation for residuals in pharynx/near airway and are therefore at higher risk for aspiration. Given frequent bouts of PNA, increased oxygen demands, severe COPD, and increased work of breath during meals, recommend trial period of nectar-thick liquids to see if symptoms can be alleviated. This was discussed at length with pt and wife and they are in agreement with plan. Pt continues to be at risk for aspiration of oral secretions (dripping down to vallecular space) and reportedly has GERD, however I am hopeful that over the course of a meal, aspiration of liquids will be reduced with liquid consistency modification and implementation of swallow strategies (breath hold, swallow, exhale, swallow again). SLP provided written information to pt/wife and will follow up while in acute setting. Pt currently on fluid restriction per cardiology, however SLP will recommend [facility] water protocol (oral care followed by free water only (no other thin liquids) between meals while in adherence to fluid restrictions. Recommend that pt thicken all other liquids to nectar-consistency for 3-4 weeks with reassessment of needs at that time. Pt given SLP phone number to remain in contact should he have further questions after discharge.  Impact on safety and function Moderate aspiration risk      CHL IP TREATMENT RECOMMENDATION 08/22/2015  Treatment Recommendations Therapy as outlined in treatment plan below     Prognosis 08/22/2015  Prognosis for Safe Diet Advancement Fair  Barriers to Reach Goals (No Data)  Barriers/Prognosis Comment --    CHL IP DIET RECOMMENDATION 08/22/2015  SLP Diet Recommendations Dysphagia 3 (Mech soft) solids;Nectar thick liquid;Free water protocol after oral care  Liquid Administration via Cup  Medication Administration Whole meds with puree  Compensations  Slow rate;Small sips/bites;Multiple dry swallows after each bite/sip  Postural Changes Remain semi-upright after after feeds/meals (Comment);Seated upright at 90 degrees      CHL IP OTHER RECOMMENDATIONS 08/22/2015  Recommended Consults --  Oral Care Recommendations Oral care BID;Patient independent with oral care  Other Recommendations Clarify dietary restrictions;Order thickener from pharmacy      CHL IP FOLLOW UP RECOMMENDATIONS 08/22/2015  Follow up Recommendations None      CHL IP FREQUENCY AND DURATION 08/22/2015  Speech Therapy Frequency (ACUTE ONLY) min 2x/week  Treatment Duration 1 week           CHL IP ORAL PHASE 08/22/2015  Oral Phase WFL  Oral - Pudding Teaspoon --  Oral - Pudding Cup --  Oral - Honey Teaspoon --  Oral - Honey Cup --  Oral - Nectar Teaspoon --  Oral - Nectar Cup --  Oral - Nectar Straw --  Oral - Thin Teaspoon --  Oral - Thin Cup --  Oral - Thin Straw --  Oral - Puree --  Oral - Mech Soft --  Oral - Regular --  Oral - Multi-Consistency --  Oral - Pill --  Oral Phase - Comment --    CHL IP PHARYNGEAL PHASE 08/22/2015  Pharyngeal Phase Impaired  Pharyngeal- Pudding Teaspoon --  Pharyngeal --  Pharyngeal- Pudding Cup --  Pharyngeal --  Pharyngeal- Honey Teaspoon --  Pharyngeal --  Pharyngeal- Honey Cup --  Pharyngeal --  Pharyngeal- Nectar Teaspoon --  Pharyngeal --  Pharyngeal- Nectar Cup Delayed swallow initiation-vallecula  Pharyngeal --  Pharyngeal- Nectar Straw --  Pharyngeal --  Pharyngeal- Thin Teaspoon --  Pharyngeal --  Pharyngeal- Thin Cup Delayed swallow initiation-vallecula;Pharyngeal residue - valleculae;Trace aspiration  Pharyngeal --  Pharyngeal- Thin Straw Delayed swallow initiation-vallecula  Pharyngeal --  Pharyngeal- Puree WFL  Pharyngeal --  Pharyngeal- Mechanical Soft --  Pharyngeal --  Pharyngeal- Regular WFL  Pharyngeal --  Pharyngeal- Multi-consistency --  Pharyngeal --  Pharyngeal- Pill WFL  Pharyngeal --   Pharyngeal Comment trace silent aspiration occurred after the swallow from residuals in valleculae     CHL IP CERVICAL ESOPHAGEAL PHASE 08/22/2015  Cervical Esophageal Phase WFL  Pudding Teaspoon --  Pudding Cup --  Honey Teaspoon --  Honey Cup --  Nectar Teaspoon --  Nectar Cup --  Nectar Straw --  Thin Teaspoon --  Thin Cup --  Thin Straw --  Puree --  Mechanical Soft --  Regular --  Multi-consistency --  Pill --  Cervical Esophageal Comment --   Thank you,  Genene Churn, [place]  Adlean Hardeman 08/22/2015, 3:19 PM

## 2015-08-23 DIAGNOSIS — D638 Anemia in other chronic diseases classified elsewhere: Secondary | ICD-10-CM

## 2015-08-23 DIAGNOSIS — R609 Edema, unspecified: Secondary | ICD-10-CM

## 2015-08-23 LAB — HEPATIC FUNCTION PANEL
ALBUMIN: 2.5 g/dL — AB (ref 3.5–5.0)
ALT: 117 U/L — AB (ref 17–63)
AST: 32 U/L (ref 15–41)
Alkaline Phosphatase: 76 U/L (ref 38–126)
BILIRUBIN DIRECT: 0.1 mg/dL (ref 0.1–0.5)
Indirect Bilirubin: 0.4 mg/dL (ref 0.3–0.9)
TOTAL PROTEIN: 5.9 g/dL — AB (ref 6.5–8.1)
Total Bilirubin: 0.5 mg/dL (ref 0.3–1.2)

## 2015-08-23 LAB — BASIC METABOLIC PANEL
Anion gap: 7 (ref 5–15)
BUN: 36 mg/dL — AB (ref 6–20)
CHLORIDE: 97 mmol/L — AB (ref 101–111)
CO2: 35 mmol/L — ABNORMAL HIGH (ref 22–32)
Calcium: 8.1 mg/dL — ABNORMAL LOW (ref 8.9–10.3)
Creatinine, Ser: 1.05 mg/dL (ref 0.61–1.24)
GFR calc Af Amer: >60 mL/min (ref >60)
GFR calc non Af Amer: >60 mL/min (ref >60)
Glucose, Bld: 304 mg/dL — ABNORMAL HIGH (ref 65–99)
POTASSIUM: 4 mmol/L (ref 3.5–5.1)
SODIUM: 139 mmol/L (ref 135–145)

## 2015-08-23 LAB — GLUCOSE, CAPILLARY
GLUCOSE-CAPILLARY: 322 mg/dL — AB (ref 65–99)
GLUCOSE-CAPILLARY: 159 mg/dL — AB (ref 65–99)
GLUCOSE-CAPILLARY: 215 mg/dL — AB (ref 65–99)
Glucose-Capillary: 294 mg/dL — ABNORMAL HIGH (ref 65–99)

## 2015-08-23 LAB — CBC WITH DIFFERENTIAL/PLATELET
Basophils Absolute: 0 10*3/uL (ref 0.0–0.1)
Basophils Relative: 0 %
Eosinophils Absolute: 0 10*3/uL (ref 0.0–0.7)
Eosinophils Relative: 0 %
HCT: 27.3 % — ABNORMAL LOW (ref 39.0–52.0)
HEMOGLOBIN: 8.4 g/dL — AB (ref 13.0–17.0)
LYMPHS ABS: 1 10*3/uL (ref 0.7–4.0)
LYMPHS PCT: 11 %
MCH: 23.6 pg — AB (ref 26.0–34.0)
MCHC: 30.8 g/dL (ref 30.0–36.0)
MCV: 76.7 fL — AB (ref 78.0–100.0)
Monocytes Absolute: 0.7 10*3/uL (ref 0.1–1.0)
Monocytes Relative: 8 %
NEUTROS PCT: 81 %
Neutro Abs: 7.4 10*3/uL (ref 1.7–7.7)
Platelets: 314 10*3/uL (ref 150–400)
RBC: 3.56 MIL/uL — AB (ref 4.22–5.81)
RDW: 21.5 % — ABNORMAL HIGH (ref 11.5–15.5)
WBC: 9.2 10*3/uL (ref 4.0–10.5)

## 2015-08-23 LAB — RETICULOCYTES
RBC.: 3.73 MIL/uL — ABNORMAL LOW (ref 4.22–5.81)
RETIC COUNT ABSOLUTE: 149.2 10*3/uL (ref 19.0–186.0)
Retic Ct Pct: 4 % — ABNORMAL HIGH (ref 0.4–3.1)

## 2015-08-23 LAB — IRON AND TIBC
IRON: 26 ug/dL — AB (ref 45–182)
Saturation Ratios: 7 % — ABNORMAL LOW (ref 17.9–39.5)
TIBC: 361 ug/dL (ref 250–450)
UIBC: 335 ug/dL

## 2015-08-23 LAB — VITAMIN B12: VITAMIN B 12: 765 pg/mL (ref 180–914)

## 2015-08-23 LAB — FOLATE: FOLATE: 15.9 ng/mL (ref >5.9)

## 2015-08-23 LAB — FERRITIN: Ferritin: 94 ng/mL (ref 24–336)

## 2015-08-23 MED ORDER — VANCOMYCIN HCL IN DEXTROSE 750-5 MG/150ML-% IV SOLN
INTRAVENOUS | Status: AC
  Filled 2015-08-23: qty 150

## 2015-08-23 MED ORDER — VARENICLINE TARTRATE 1 MG PO TABS
0.5000 mg | ORAL_TABLET | Freq: Two times a day (BID) | ORAL | Status: DC
  Filled 2015-08-23 (×3): qty 1

## 2015-08-23 MED ORDER — VARENICLINE TARTRATE 1 MG PO TABS: 1.0000 mg | ORAL_TABLET | Freq: Two times a day (BID) | ORAL | Status: DC

## 2015-08-23 MED ORDER — VARENICLINE TARTRATE 1 MG PO TABS
0.5000 mg | ORAL_TABLET | Freq: Every day | ORAL | Status: AC
  Administered 2015-08-23 – 2015-08-25 (×3): 0.5 mg via ORAL
  Filled 2015-08-23 (×3): qty 1

## 2015-08-23 NOTE — Progress Notes (Signed)
Speech Language Pathology Treatment: Dysphagia  Patient Details Name: Ryan [name] MRN: [id number] DOB: 18-Sep-1960 Today's Date: 08/23/2015 Time: 1700-1749 SLP Time Calculation (min) (ACUTE ONLY): 29 min  Assessment / Plan / Recommendation Clinical Impression  Pt seen for tolerance of diet following MBS yesterday. SLP recommendations were nectar thick liquids, mechanical soft textures and [place] [place] free water protocol: (water in between meals after oral care). Pt reports not having any thickener and that dietary staff served thin liquids for breakfast and lunch meals. Education reinforced to nursing and dietary as pt with episodic aspiration on MBS and recurrent PNA.  SLP observed pt this date with thin and nectar thick trials which pt continued to exhibit suspected episodic aspiration characterized by overt productive coughing spells. Pt with signficant reduction in coughing with use of supraglottic swallow strategy (hold breath prior to and during swallow). Recommend continue nectar thick liquids, mechanical soft, and [place] water protocol with use of supraglottic swallow strategy when consuming liquids. Pt able to implement without cueing successfully, strategies posted visually at bedside, with pts wife also present during safe swallow education. SLP to continue to monitor.    HPI HPI: Ryan [name] is a 55 y.o. year old male with a history of COPD, presenting with increasing shortness of breath and hypoxia. O2 sats were 70% in the field prior to arrival. Findings of questionable pneumonia on chest xray. History of MRSA bacteremia in Jan 2017, requiring prolonged admission. GI consulted due to IDA. Denies any overt GI bleeding to include melena, hematochezia, hematemesis. Hemoccult negative in ED. Very rare nausea. Has felt light-headed and dizzy. Notes chronic GERD, on Protonix BID as outpatient. No dysphagia. No abdominal pain. Denies diarrhea, constipation. He denies any weight loss. Has a good  appetite. Notes lower extremity pitting edema and feels like his abdomen is distended as well. He recently had an EGD by Dr. Michail Sermon in Oct 2016 with minimal erosive esophagitis and small hiatal hernia. However, he had food in his stomach, which precluded complete evaluation of upper GI tract. History of Barrett's on EGD in 2011. Elevated LFTs; in setting of fatty liver. He denies any prior known history of chronic liver disease. GI following and may do EGD once stable from respiratory standpoint. Dr. Luan Pulling requested SLP for swallowing evaluation.      SLP Plan  Continue with current plan of care     Recommendations  Diet recommendations: Dysphagia 3 (mechanical soft);Nectar-thick liquid Liquids provided via: Cup;Straw Medication Administration: Whole meds with puree Supervision: Patient able to self feed;Intermittent supervision to cue for compensatory strategies Compensations: Slow rate;Small sips/bites;Multiple dry swallows after each bite/sip;Other (Comment) (supraglottic swallow with liquids ) Postural Changes and/or Swallow Maneuvers: Hold breath before and during swallow (Supraglottic swallow)             Oral Care Recommendations: Oral care BID Plan: Continue with current plan of care     GO               [name] MA, [place] Speech Language Pathologist    [name] 08/23/2015, 4:44 PM

## 2015-08-23 NOTE — Progress Notes (Signed)
Subjective:  Patient slowly improving from respiratory standpoint. Still very weak. Doesn't feel like he could undergo bowel prep right now and wants to wait at least 1-2 days OR as outpatient. No melena, brbpr. BM normal. No vomiting. No abdominal pain.  Objective: Vital signs in last 24 hours: Temp:  [97.6 F (36.4 C)-98 F (36.7 C)] 97.6 F (36.4 C) (02/09 0549) Pulse Rate:  [58-100] 59 (02/09 0549) Resp:  [15-25] 22 (02/09 0549) BP: (109-136)/(65-76) 130/72 mmHg (02/09 0549) SpO2:  [92 %-100 %] 100 % (02/09 0549) Last BM Date: 08/21/15 General:   Alert,  Well-developed, well-nourished, pleasant and cooperative in NAD Head:  Normocephalic and atraumatic. Eyes:  Sclera clear, no icterus.  Abdomen:  Soft, firm, nontender and nondistended.   Normal bowel sounds, without guarding, and without rebound.   Extremities:  Without clubbing, deformity. 2-3+ pitting edema to 4 inches below the knees, slightly improved Neurologic:  Alert and  oriented x4;  grossly normal neurologically. Skin:  Intact without significant lesions or rashes. Psych:  Alert and cooperative. Normal mood and affect.  Intake/Output from previous day: 02/08 0701 - 02/09 0700 In: 2885 [P.O.:960; I.V.:1725; IV Piggyback:200] Out: 4850 [Urine:4850] Intake/Output this shift:    Lab Results: CBC  Recent Labs  08/21/15 0545 08/22/15 0515 08/23/15 0608  WBC 6.5 7.4 9.2  HGB 7.6* 7.6* 8.4*  HCT 24.8* 24.6* 27.3*  MCV 76.3* 76.4* 76.7*  PLT 253 268 314   Lab Results  Component Value Date   IRON 18* 08/22/2015   TIBC 311 08/22/2015   FERRITIN 109 08/22/2015   Lab Results  Component Value Date   XBJYNWGN56 213 08/22/2015   Lab Results  Component Value Date   FOLATE 15.6 08/22/2015     BMET  Recent Labs  08/21/15 0545 08/22/15 0515 08/23/15 0608  NA 141 139 139  K 3.2* 3.3* 4.0  CL 97* 96* 97*  CO2 33* 34* 35*  GLUCOSE 323* 313* 304*  BUN 36* 34* 36*  CREATININE 1.25* 1.04 1.05  CALCIUM  8.0* 7.9* 8.1*   LFTs  Recent Labs  08/21/15 0545  BILITOT 0.4  BILIDIR 0.1  IBILI 0.3  ALKPHOS 71  AST 46*  ALT 187*  PROT 5.9*  ALBUMIN 2.3*   No results for input(s): LIPASE in the last 72 hours. PT/INR No results for input(s): LABPROT, INR in the last 72 hours.    Imaging Studies: US Abdomen Complete  08/20/2015  CLINICAL DATA:  Elevated LFTs, upper abdominal pain EXAM: ABDOMEN ULTRASOUND COMPLETE COMPARISON:  CT abdomen pelvis dated 05/01/2015 FINDINGS: Gallbladder: No gallstones, gallbladder wall thickening, or pericholecystic fluid. Negative sonographic Murphy's sign. Common bile duct: Diameter: 4 mm Liver: Coarse hepatic echotexture with hyperechoic hepatic parenchyma, nonspecific but likely reflecting hepatic steatosis. No focal hepatic lesion is seen. IVC: No abnormality visualized. Pancreas: Visualized portion unremarkable. Spleen: Size and appearance within normal limits. Right Kidney: Length: 12.3 cm.  No mass or hydronephrosis. Left Kidney: Length: 12.7 cm.  No mass or hydronephrosis. Abdominal aorta: No aneurysm visualized. Other findings: None. IMPRESSION: Suspected hepatic steatosis. Otherwise negative abdominal ultrasound. Electronically Signed   By: Julian Hy M.D.   On: 08/20/2015 11:56   US Venous Img Lower Bilateral  08/20/2015  CLINICAL DATA:  Lower extremity swelling for 1 month EXAM: BILATERAL LOWER EXTREMITY VENOUS DOPPLER ULTRASOUND TECHNIQUE: Gray-scale sonography with graded compression, as well as color Doppler and duplex ultrasound were performed to evaluate the lower extremity deep venous systems from the level of the common  femoral vein and including the common femoral, femoral, profunda femoral, popliteal and calf veins including the posterior tibial, peroneal and gastrocnemius veins when visible. The superficial great saphenous vein was also interrogated. Spectral Doppler was utilized to evaluate flow at rest and with distal augmentation maneuvers in  the common femoral, femoral and popliteal veins. COMPARISON:  None. FINDINGS: RIGHT LOWER EXTREMITY Common Femoral Vein: No evidence of thrombus. Normal compressibility, respiratory phasicity and response to augmentation. Saphenofemoral Junction: No evidence of thrombus. Normal compressibility and flow on color Doppler imaging. Profunda Femoral Vein: No evidence of thrombus. Normal compressibility and flow on color Doppler imaging. Femoral Vein: No evidence of thrombus. Normal compressibility, respiratory phasicity and response to augmentation. Popliteal Vein: No evidence of thrombus. Normal compressibility, respiratory phasicity and response to augmentation. Calf Veins: No evidence of thrombus. Normal compressibility and flow on color Doppler imaging. Superficial Great Saphenous Vein: No evidence of thrombus. Normal compressibility and flow on color Doppler imaging. Venous Reflux:  None. Other Findings:  Mild subcutaneous edema is noted. LEFT LOWER EXTREMITY Common Femoral Vein: No evidence of thrombus. Normal compressibility, respiratory phasicity and response to augmentation. Saphenofemoral Junction: No evidence of thrombus. Normal compressibility and flow on color Doppler imaging. Profunda Femoral Vein: No evidence of thrombus. Normal compressibility and flow on color Doppler imaging. Femoral Vein: No evidence of thrombus. Normal compressibility, respiratory phasicity and response to augmentation. Popliteal Vein: No evidence of thrombus. Normal compressibility, respiratory phasicity and response to augmentation. Calf Veins: No evidence of thrombus. Normal compressibility and flow on color Doppler imaging. Superficial Great Saphenous Vein: No evidence of thrombus. Normal compressibility and flow on color Doppler imaging. Venous Reflux:  None. Other Findings: Mild subcutaneous edema is noted. 3.9 cm lymph node is noted in the left groin. This is new from the prior exam of October 2016. IMPRESSION: No evidence of  deep venous thrombosis. Bilateral lower extremity subcutaneous edema. Prominent lymph node in the left inguinal region likely reactive in nature. Clinical correlation is recommended. Electronically Signed   By: Inez Catalina M.D.   On: 08/20/2015 11:49   Dg Chest Portable 1 View  08/17/2015  CLINICAL DATA:  Shortness of breath. Pneumonia. Subsequent encounter. EXAM: PORTABLE CHEST 1 VIEW COMPARISON:  Single view of the chest 07/26/2015. FINDINGS: Endotracheal tube and NG tube been removed. Small right pleural effusion is decreased in size since the comparison study. There is streaky right basilar airspace disease. Mild left basilar atelectasis is unchanged. The lungs are markedly emphysematous. Scar in the left upper lobe is not well demonstrated due to overlying tubes. No pneumothorax. Heart size normal. IMPRESSION: Decreased right pleural effusion with persistent streaky right basilar airspace opacity which is worrisome for pneumonia. Emphysema. Electronically Signed   By: Inge Rise M.D.   On: 08/17/2015 11:17   Portable Chest Xray  07/26/2015  CLINICAL DATA:  Intubation. EXAM: PORTABLE CHEST 1 VIEW COMPARISON:  07/25/2015. FINDINGS: Endotracheal tube and NG tube in stable position. Mediastinum and hilar structures normal. Heart size normal. COPD . Improving infiltrate right lower lobe. Persistent small pleural effusion. No pneumothorax. IMPRESSION: 1. Lines and tubes in stable position . 2. Interim slight improvement of right lower lobe infiltrate. Persistent small right pleural effusion. Electronically Signed   By: Marcello Moores  Register   On: 07/26/2015 07:50   Dg Chest Port 1 View  07/25/2015  CLINICAL DATA:  Intubated patient, shortness of breath EXAM: PORTABLE CHEST 1 VIEW COMPARISON:  Chest x-ray of July 15, 2015, and chest CT scan dated May 01, 2015. FINDINGS: The lungs are well-expanded. There is confluent interstitial infiltrate in the right lower lobe. The right hemidiaphragm is now  obscured. There is patchy interstitial density at the left lung base which is stable. The heart is normal in size. The pulmonary vascularity is prominent centrally but there is no pulmonary vascular congestion. The endotracheal tube tip lies approximately 5.7 cm above the carina. IMPRESSION: Bullous emphysema. Worsening of interstitial infiltrate in the right lower lung likely reflecting pneumonia. Stable interstitial density at the left lung base. The endotracheal tube is in reasonable position. Electronically Signed   By: Irving  Martinique M.D.   On: 07/25/2015 08:16   Dg Foot 2 Views Left  08/20/2015  CLINICAL DATA:  LEFT foot pain and swelling EXAM: LEFT FOOT - 2 VIEW COMPARISON:  Portable exam 1413 hours compared to 05/01/2015 FINDINGS: Bones appear demineralized. Joint spaces preserved. Displaced fracture fragment identified at distal lateral aspect of the calcaneus at calcaneal cuboid joint. No additional fracture, dislocation or bone destruction. Diffuse soft tissue swelling LEFT foot greatest at dorsum. IMPRESSION: Subacute distal LEFT calcaneal fracture extending intact calcaneocuboid joint. Marked soft tissue swelling without additional acute bony abnormalities. Electronically Signed   By: Lavonia Dana M.D.   On: 08/20/2015 14:33   Dg Swallowing Func-speech Pathology  08/22/2015  Ephraim Hamburger, CCC-SLP     08/22/2015 10:42 PM Objective Swallowing Evaluation: Type of Study: MBS-Modified Barium Swallow Study Patient Details Name: NAYQUAN EVINGER MRN: [id number] Date of Birth: [birth year] Today's Date: 08/22/2015 Time: SLP Start Time (ACUTE ONLY): 1310-SLP Stop Time (ACUTE ONLY): 1430 SLP Time Calculation (min) (ACUTE ONLY): 80 min Past Medical History: Past Medical History Diagnosis Date . COPD (chronic obstructive pulmonary disease) ([place])  . Hypercholesteremia  . History of pneumonia  . Polycythemia  . Coronary atherosclerosis of native coronary artery    Mild nonobstructive disease at catheterization 2007 .  Cavitary lesion of lung 05/07/2011   Cultures grew MAI, tx antibiotics . Borderline diabetes  . Seizures ([place])    Last seizure 2 yrs ago . GERD (gastroesophageal reflux disease)  . Type 2 diabetes mellitus ([place])  . DDD (degenerative disc disease)    Cervical and thoracic . Chronic back pain  . Bronchitis  . Chronic left shoulder pain  . Chronic neck pain  . Atrial fibrillation ([place])    Not anticoagulated . Smoker  . Type 2 diabetes mellitus without complication ([place])  . CAP (community acquired pneumonia) 07/10/2013   04/2015 . Collagen vascular disease ([place])  . Chronic respiratory failure ([place])  . Barrett's esophagus  Past Surgical History: Past Surgical History Procedure Laterality Date . Colonoscopy  2012   Dr. Posey Pronto: normal . Vasectomy   . Throat biopsy   . Lung biopsy   . Vasectomy  1987 . Esophagogastroduodenoscopy N/A 05/04/2015   Dr. Michail Sermon: minimal erosive esophagitis, small hiatal hernia. FOOD PRECLUDED A COMPLETE EXAM. No biopsies taken . Esophagogastroduodenoscopy  2011   [place]: Barrett's  HPI: Julez Huseby Boulier is a 55 y.o. year old male with a history of COPD, presenting with increasing shortness of breath and hypoxia. O2 sats were 70% in the field prior to arrival. Findings of questionable pneumonia on chest xray. History of MRSA bacteremia in Jan 2017, requiring prolonged admission. GI consulted due to IDA. Denies any overt GI bleeding to include melena, hematochezia, hematemesis. Hemoccult negative in ED. Very rare nausea. Has felt light-headed and dizzy. Notes chronic GERD, on Protonix BID as outpatient. No dysphagia. No abdominal pain. Denies diarrhea,  constipation. He denies any weight loss. Has a good appetite. Notes lower extremity pitting edema and feels like his abdomen is distended as well. He recently had an EGD by Dr. Michail Sermon in Oct 2016 with minimal erosive esophagitis and small hiatal hernia. However, he had food in his stomach, which precluded complete evaluation of upper GI tract.  History of Barrett's on EGD in 2011. Elevated LFTs; in setting of fatty liver. He denies any prior known history of chronic liver disease. GI following and may do EGD once stable from respiratory standpoint. Dr. Luan Pulling requested SLP for swallowing evaluation. Subjective: Pt reports feeling ok Assessment / Plan / Recommendation CHL IP CLINICAL IMPRESSIONS 08/22/2015 Therapy Diagnosis Mild pharyngeal phase dysphagia Clinical Impression Mild pharyngeal phase with min delay in swallow initiation and trace residuals in valleculae after the swallow with thins. Pt with one single episode of trace, silent aspiration of thins when he inhaled just after primary swallow and before secondary swallow elicited. Strong coughing episode did eventually occur after several seconds when aspirate dropped deeper into trachea (suspected). Pt was challenged throughout the rest of the evaluation (straw sips, mixed consistencies, sequential swallows), however no further penetration or aspiration observed. Pt did continue to have mild vallecular residue (lingual coating dripped down) after thins, but no further aspiration visualized. Suspect pt has transient episodes of aspiration over the course of a meal and possibly with pooled secretions at times given pt/wife report of strong coughing episodes where he cannot catch his breath for several minutes. Many pt's with severe COPD have decreased sensation for residuals in pharynx/near airway and are therefore at higher risk for aspiration. Given frequent bouts of PNA, increased oxygen demands, severe COPD, and increased work of breath during meals, recommend trial period of nectar-thick liquids to see if symptoms can be alleviated. This was discussed at length with pt and wife and they are in agreement with plan. Pt continues to be at risk for aspiration of oral secretions (dripping down to vallecular space) and reportedly has GERD, however I am hopeful that over the course of a meal, aspiration  of liquids will be reduced with liquid consistency modification and implementation of swallow strategies (breath hold, swallow, exhale, swallow again). SLP provided written information to pt/wife and will follow up while in acute setting. Pt currently on fluid restriction per cardiology, however SLP will recommend [facility] water protocol (oral care followed by free water only (no other thin liquids) between meals while in adherence to fluid restrictions. Recommend that pt thicken all other liquids to nectar-consistency for 3-4 weeks with reassessment of needs at that time. Pt given SLP phone number to remain in contact should he have further questions after discharge. Impact on safety and function Moderate aspiration risk   CHL IP TREATMENT RECOMMENDATION 08/22/2015 Treatment Recommendations Therapy as outlined in treatment plan below   Prognosis 08/22/2015 Prognosis for Safe Diet Advancement Fair Barriers to Reach Goals (No Data) Barriers/Prognosis Comment -- CHL IP DIET RECOMMENDATION 08/22/2015 SLP Diet Recommendations Dysphagia 3 (Mech soft) solids;Nectar thick liquid;Free water protocol after oral care Liquid Administration via Cup Medication Administration Whole meds with puree Compensations Slow rate;Small sips/bites;Multiple dry swallows after each bite/sip Postural Changes Remain semi-upright after after feeds/meals (Comment);Seated upright at 90 degrees   CHL IP OTHER RECOMMENDATIONS 08/22/2015 Recommended Consults -- Oral Care Recommendations Oral care BID;Patient independent with oral care Other Recommendations Clarify dietary restrictions;Order thickener from pharmacy   CHL IP FOLLOW UP RECOMMENDATIONS 08/22/2015 Follow up Recommendations None   CHL IP FREQUENCY  AND DURATION 08/22/2015 Speech Therapy Frequency (ACUTE ONLY) min 2x/week Treatment Duration 1 week      CHL IP ORAL PHASE 08/22/2015 Oral Phase WFL Oral - Pudding Teaspoon -- Oral - Pudding Cup -- Oral - Honey Teaspoon -- Oral - Honey Cup -- Oral - Nectar  Teaspoon -- Oral - Nectar Cup -- Oral - Nectar Straw -- Oral - Thin Teaspoon -- Oral - Thin Cup -- Oral - Thin Straw -- Oral - Puree -- Oral - Mech Soft -- Oral - Regular -- Oral - Multi-Consistency -- Oral - Pill -- Oral Phase - Comment --  CHL IP PHARYNGEAL PHASE 08/22/2015 Pharyngeal Phase Impaired Pharyngeal- Pudding Teaspoon -- Pharyngeal -- Pharyngeal- Pudding Cup -- Pharyngeal -- Pharyngeal- Honey Teaspoon -- Pharyngeal -- Pharyngeal- Honey Cup -- Pharyngeal -- Pharyngeal- Nectar Teaspoon -- Pharyngeal -- Pharyngeal- Nectar Cup Delayed swallow initiation-vallecula Pharyngeal -- Pharyngeal- Nectar Straw -- Pharyngeal -- Pharyngeal- Thin Teaspoon -- Pharyngeal -- Pharyngeal- Thin Cup Delayed swallow initiation-vallecula;Pharyngeal residue - valleculae;Trace aspiration Pharyngeal -- Pharyngeal- Thin Straw Delayed swallow initiation-vallecula Pharyngeal -- Pharyngeal- Puree WFL Pharyngeal -- Pharyngeal- Mechanical Soft -- Pharyngeal -- Pharyngeal- Regular WFL Pharyngeal -- Pharyngeal- Multi-consistency -- Pharyngeal -- Pharyngeal- Pill WFL Pharyngeal -- Pharyngeal Comment trace silent aspiration occurred after the swallow from residuals in valleculae  CHL IP CERVICAL ESOPHAGEAL PHASE 08/22/2015 Cervical Esophageal Phase WFL Pudding Teaspoon -- Pudding Cup -- Honey Teaspoon -- Honey Cup -- Nectar Teaspoon -- Nectar Cup -- Nectar Straw -- Thin Teaspoon -- Thin Cup -- Thin Straw -- Puree -- Mechanical Soft -- Regular -- Multi-consistency -- Pill -- Cervical Esophageal Comment -- Thank you, Genene Churn, [place] PORTER,DABNEY 08/22/2015, 3:19 PM             [2 weeks]   Assessment: 56 year old male with multiple comorbidities, presenting this admission with probable pneumonia, acute on chronic respiratory failure in the setting of severe COPD at baseline. GI consulted due to anemia, likely IDA component and multifactorial in the setting of chronic disease. He has no overt GI bleeding whatsoever; negative  hemoccult in ED. Notable, his Hgb was in the 5 range in the ER but there was concern for hemodilution due to PICC line and labs were redrawn. Hgb was up in 7 range therefore patient did not require transfusion. Previous GI notes indicated that he received 2 units of prbcs but this was actually cancelled. He has been maintaining in the 7 range currently. Upon review of historical labs, evidence of drifting Hgb began in Oct 2016; he had been maintaining a value of around 10, so this presentation is new. In the setting of chronic anticoagulation [facility]), concern remains for stuttering GI bleed anywhere in the GI tract. EGD fairly recent, as of Oct 2016 by Dr. Michail Sermon. However, upon further evaluation of procedure note, he had food in his stomach, which precluded complete evaluation of upper GI tract. He also has a history of Barrett's esophagus noted in 2011; recent EGD in 2016 without any biopsies completed. Last colonoscopy in 2012 normal.   He will definitely need GI evaluation due to new onset anemia; at this point, would recommend a colonoscopy with repeat EGD for complete evaluation of upper GI tract and biopsy, as prior EGD was not entirely complete. Capsule study could be considered if no obvious source for anemia at a later date. For now, he is not stable from a respiratory standpoint to undergo a procedure. Will continue to follow.   Elevated LFTs: in setting of fatty liver.  He denies any prior known history of chronic liver disease. [place] without ascites. He has notable lower extremity edema on exam. ECHO reviewed from Jan 2017 with EF of 60-65%. No stigmata of portal hypertension on recent EGD. Overall, LFTs continue to improve.serologies negative.    Plan: 1. Patient leaning towards outpatient TCS/EGD as he is weak and doesn't feel he can undergo bowel prep. His Hgb has been stable this admission without overt signs of bleeding. He did not require transfusion. Would be reasonable  approach. 2. Recheck LFTs.  3. Continue PPI.   Laureen Ochs. Bernarda Caffey [facility] [phone] 2/9/20179:16 AM     LOS: 6 days

## 2015-08-23 NOTE — Care Management Note (Signed)
Case Management Note  Patient Details  Name: Ryan [name] MRN: [id number] Date of Birth: 02-01-1961  Expected Discharge Date:                  Expected Discharge Plan:  [place]  In-House Referral:  NA  Discharge planning Services  CM Consult  Post Acute Care Choice:  Resumption of Svcs/PTA Provider Choice offered to:  Patient  DME Arranged:    DME Agency:     HH Arranged:  RN [place] Agency:  [facility]  Status of Service:  In process, will continue to follow  Medicare Important Message Given:    Date Medicare IM Given:    Medicare IM give by:    Date Additional Medicare IM Given:    Additional Medicare Important Message give by:     If discussed at [place] of Stay Meetings, dates discussed:  08/23/2015  Additional Comments:  Sherald Barge, RN 08/23/2015, 11:45 AM

## 2015-08-23 NOTE — Progress Notes (Addendum)
Results for ALBINO, BUFFORD (MRN [id number]) as of 08/23/2015 13:22  Ref. Range 08/22/2015 11:46 08/22/2015 16:40 08/22/2015 21:10 08/23/2015 07:48 08/23/2015 12:00  Glucose-Capillary Latest Ref Range: 65-99 mg/dL 348 (H) 129 (H) 376 (H) 322 (H) 294 (H)  Noted that blood sugars continue to be greater than 250 mg/dl. Recommend increasing Lantus to 40 units daily, continuing Novolog RESISTANT correction scale TID & HS, and increasing Novolog meal coverage to 10 or 12 units TID.  Will continue to monitor blood sugars while in hospital. Harvel Ricks RN BSN CDE

## 2015-08-24 DIAGNOSIS — E876 Hypokalemia: Secondary | ICD-10-CM

## 2015-08-24 DIAGNOSIS — J441 Chronic obstructive pulmonary disease with (acute) exacerbation: Secondary | ICD-10-CM

## 2015-08-24 LAB — GLUCOSE, CAPILLARY
GLUCOSE-CAPILLARY: 322 mg/dL — AB (ref 65–99)
GLUCOSE-CAPILLARY: 548 mg/dL — AB (ref 65–99)
GLUCOSE-CAPILLARY: 373 mg/dL — AB (ref 65–99)
Glucose-Capillary: 188 mg/dL — ABNORMAL HIGH (ref 65–99)
Glucose-Capillary: 174 mg/dL — ABNORMAL HIGH (ref 65–99)

## 2015-08-24 LAB — CBC WITH DIFFERENTIAL/PLATELET
BASOS ABS: 0 10*3/uL (ref 0.0–0.1)
Basophils Relative: 0 %
EOS PCT: 0 %
Eosinophils Absolute: 0 10*3/uL (ref 0.0–0.7)
HCT: 29.2 % — ABNORMAL LOW (ref 39.0–52.0)
Hemoglobin: 8.9 g/dL — ABNORMAL LOW (ref 13.0–17.0)
LYMPHS ABS: 1 10*3/uL (ref 0.7–4.0)
LYMPHS PCT: 8 %
MCH: 23.5 pg — AB (ref 26.0–34.0)
MCHC: 30.5 g/dL (ref 30.0–36.0)
MCV: 77 fL — AB (ref 78.0–100.0)
MONO ABS: 0.9 10*3/uL (ref 0.1–1.0)
MONOS PCT: 8 %
Neutro Abs: 10.2 10*3/uL — ABNORMAL HIGH (ref 1.7–7.7)
Neutrophils Relative %: 84 %
PLATELETS: 313 10*3/uL (ref 150–400)
RBC: 3.79 MIL/uL — ABNORMAL LOW (ref 4.22–5.81)
RDW: 22 % — AB (ref 11.5–15.5)
WBC: 12.1 10*3/uL — ABNORMAL HIGH (ref 4.0–10.5)

## 2015-08-24 LAB — BASIC METABOLIC PANEL
Anion gap: 10 (ref 5–15)
BUN: 37 mg/dL — AB (ref 6–20)
CALCIUM: 8.3 mg/dL — AB (ref 8.9–10.3)
CO2: 33 mmol/L — ABNORMAL HIGH (ref 22–32)
CREATININE: 1.1 mg/dL (ref 0.61–1.24)
Chloride: 97 mmol/L — ABNORMAL LOW (ref 101–111)
GFR calc Af Amer: >60 mL/min (ref >60)
GLUCOSE: 232 mg/dL — AB (ref 65–99)
Potassium: 4.3 mmol/L (ref 3.5–5.1)
Sodium: 140 mmol/L (ref 135–145)

## 2015-08-24 LAB — GLUCOSE, RANDOM: Glucose, Bld: 572 mg/dL (ref 65–99)

## 2015-08-24 MED ORDER — VANCOMYCIN HCL IN DEXTROSE 750-5 MG/150ML-% IV SOLN
INTRAVENOUS | Status: AC
  Filled 2015-08-24: qty 150

## 2015-08-24 MED ORDER — METHYLPREDNISOLONE SODIUM SUCC 40 MG IJ SOLR
40.0000 mg | Freq: Two times a day (BID) | INTRAMUSCULAR | Status: DC
  Administered 2015-08-24 – 2015-08-25 (×3): 40 mg via INTRAVENOUS
  Filled 2015-08-24 (×3): qty 1

## 2015-08-24 NOTE — Progress Notes (Signed)
Subjective: States he's doing well. breathing slowly improving. Last BM 2 days ago, normal/soft. Denies melena, hematochezia, N/V. Mild generalized abdominal tenderness. Prefers outpatient endoscopic evaluation. No other upper or lower GI complaints.  Objective: Vital signs in last 24 hours: Temp:  [97.9 F (36.6 C)-98.4 F (36.9 C)] 97.9 F (36.6 C) (02/10 0557) Pulse Rate:  [70-96] 96 (02/10 0826) Resp:  [15-20] 15 (02/10 0557) BP: (120-151)/(67-82) 120/71 mmHg (02/10 0826) SpO2:  [92 %-98 %] 92 % (02/10 0730) Last BM Date: 08/21/15 General:   Alert and oriented, pleasant Head:  Normocephalic and atraumatic. Eyes:  No icterus, sclera clear. Conjuctiva pink.  Heart:  S1, S2 present, no murmurs noted.  Lungs: Diminished throughout, minimal expiratory wheezes noted at based.  Abdomen:  Bowel sounds present, firm, mild TTP, non-distended. No HSM or hernias noted. No rebound or guarding. Neurologic:  Alert and  oriented x4;  grossly normal neurologically. Psych:  Alert and cooperative. Normal mood and affect.  Intake/Output from previous day: 02/09 0701 - 02/10 0700 In: 1203 [P.O.:1200; I.V.:3] Out: 5100 [Urine:5100] Intake/Output this shift: Total I/O In: 3 [I.V.:3] Out: -   Lab Results:  Recent Labs  08/22/15 0515 08/23/15 0608 08/24/15 0649  WBC 7.4 9.2 12.1*  HGB 7.6* 8.4* 8.9*  HCT 24.6* 27.3* 29.2*  PLT 268 314 313   BMET  Recent Labs  08/22/15 0515 08/23/15 0608 08/24/15 0649  NA 139 139 140  K 3.3* 4.0 4.3  CL 96* 97* 97*  CO2 34* 35* 33*  GLUCOSE 313* 304* 232*  BUN 34* 36* 37*  CREATININE 1.04 1.05 1.10  CALCIUM 7.9* 8.1* 8.3*   LFT  Recent Labs  08/23/15 0939  PROT 5.9*  ALBUMIN 2.5*  AST 32  ALT 117*  ALKPHOS 76  BILITOT 0.5  BILIDIR 0.1  IBILI 0.4   PT/INR No results for input(s): LABPROT, INR in the last 72 hours. Hepatitis Panel No results for input(s): HEPBSAG, HCVAB, HEPAIGM, HEPBIGM in the last 72  hours.   Studies/Results: Dg Swallowing Func-speech Pathology  08/22/2015  Ephraim Hamburger, CCC-SLP     08/22/2015 10:42 PM Objective Swallowing Evaluation: Type of Study: MBS-Modified Barium Swallow Study Patient Details Name: Ryan [name] MRN: [id number] Date of Birth: [birth year] Today's Date: 08/22/2015 Time: SLP Start Time (ACUTE ONLY): 1310-SLP Stop Time (ACUTE ONLY): 1430 SLP Time Calculation (min) (ACUTE ONLY): 80 min Past Medical History: Past Medical History Diagnosis Date . COPD (chronic obstructive pulmonary disease) ([place])  . Hypercholesteremia  . History of pneumonia  . Polycythemia  . Coronary atherosclerosis of native coronary artery    Mild nonobstructive disease at catheterization 2007 . Cavitary lesion of lung 05/07/2011   Cultures grew MAI, tx antibiotics . Borderline diabetes  . Seizures ([place])    Last seizure 2 yrs ago . GERD (gastroesophageal reflux disease)  . Type 2 diabetes mellitus ([place])  . DDD (degenerative disc disease)    Cervical and thoracic . Chronic back pain  . Bronchitis  . Chronic left shoulder pain  . Chronic neck pain  . Atrial fibrillation ([place])    Not anticoagulated . Smoker  . Type 2 diabetes mellitus without complication ([place])  . CAP (community acquired pneumonia) 07/10/2013   04/2015 . Collagen vascular disease ([place])  . Chronic respiratory failure ([place])  . Barrett's esophagus  Past Surgical History: Past Surgical History Procedure Laterality Date . Colonoscopy  2012   Dr. Posey Pronto: normal . Vasectomy   . Throat biopsy   . Lung  biopsy   . Vasectomy  1987 . Esophagogastroduodenoscopy N/A 05/04/2015   Dr. Michail Sermon: minimal erosive esophagitis, small hiatal hernia. FOOD PRECLUDED A COMPLETE EXAM. No biopsies taken . Esophagogastroduodenoscopy  2011   [place]: Barrett's  HPI: Ryan [name] is a 55 y.o. year old male with a history of COPD, presenting with increasing shortness of breath and hypoxia. O2 sats were 70% in the field prior to arrival. Findings of questionable pneumonia on  chest xray. History of MRSA bacteremia in Jan 2017, requiring prolonged admission. GI consulted due to IDA. Denies any overt GI bleeding to include melena, hematochezia, hematemesis. Hemoccult negative in ED. Very rare nausea. Has felt light-headed and dizzy. Notes chronic GERD, on Protonix BID as outpatient. No dysphagia. No abdominal pain. Denies diarrhea, constipation. He denies any weight loss. Has a good appetite. Notes lower extremity pitting edema and feels like his abdomen is distended as well. He recently had an EGD by Dr. Michail Sermon in Oct 2016 with minimal erosive esophagitis and small hiatal hernia. However, he had food in his stomach, which precluded complete evaluation of upper GI tract. History of Barrett's on EGD in 2011. Elevated LFTs; in setting of fatty liver. He denies any prior known history of chronic liver disease. GI following and may do EGD once stable from respiratory standpoint. Dr. Luan Pulling requested SLP for swallowing evaluation. Subjective: Pt reports feeling ok Assessment / Plan / Recommendation CHL IP CLINICAL IMPRESSIONS 08/22/2015 Therapy Diagnosis Mild pharyngeal phase dysphagia Clinical Impression Mild pharyngeal phase with min delay in swallow initiation and trace residuals in valleculae after the swallow with thins. Pt with one single episode of trace, silent aspiration of thins when he inhaled just after primary swallow and before secondary swallow elicited. Strong coughing episode did eventually occur after several seconds when aspirate dropped deeper into trachea (suspected). Pt was challenged throughout the rest of the evaluation (straw sips, mixed consistencies, sequential swallows), however no further penetration or aspiration observed. Pt did continue to have mild vallecular residue (lingual coating dripped down) after thins, but no further aspiration visualized. Suspect pt has transient episodes of aspiration over the course of a meal and possibly with pooled secretions at  times given pt/wife report of strong coughing episodes where he cannot catch his breath for several minutes. Many pt's with severe COPD have decreased sensation for residuals in pharynx/near airway and are therefore at higher risk for aspiration. Given frequent bouts of PNA, increased oxygen demands, severe COPD, and increased work of breath during meals, recommend trial period of nectar-thick liquids to see if symptoms can be alleviated. This was discussed at length with pt and wife and they are in agreement with plan. Pt continues to be at risk for aspiration of oral secretions (dripping down to vallecular space) and reportedly has GERD, however I am hopeful that over the course of a meal, aspiration of liquids will be reduced with liquid consistency modification and implementation of swallow strategies (breath hold, swallow, exhale, swallow again). SLP provided written information to pt/wife and will follow up while in acute setting. Pt currently on fluid restriction per cardiology, however SLP will recommend [facility] water protocol (oral care followed by free water only (no other thin liquids) between meals while in adherence to fluid restrictions. Recommend that pt thicken all other liquids to nectar-consistency for 3-4 weeks with reassessment of needs at that time. Pt given SLP phone number to remain in contact should he have further questions after discharge. Impact on safety and function Moderate aspiration risk  CHL IP TREATMENT RECOMMENDATION 08/22/2015 Treatment Recommendations Therapy as outlined in treatment plan below   Prognosis 08/22/2015 Prognosis for Safe Diet Advancement Fair Barriers to Reach Goals (No Data) Barriers/Prognosis Comment -- CHL IP DIET RECOMMENDATION 08/22/2015 SLP Diet Recommendations Dysphagia 3 (Mech soft) solids;Nectar thick liquid;Free water protocol after oral care Liquid Administration via Cup Medication Administration Whole meds with puree Compensations Slow rate;Small  sips/bites;Multiple dry swallows after each bite/sip Postural Changes Remain semi-upright after after feeds/meals (Comment);Seated upright at 90 degrees   CHL IP OTHER RECOMMENDATIONS 08/22/2015 Recommended Consults -- Oral Care Recommendations Oral care BID;Patient independent with oral care Other Recommendations Clarify dietary restrictions;Order thickener from pharmacy   CHL IP FOLLOW UP RECOMMENDATIONS 08/22/2015 Follow up Recommendations None   CHL IP FREQUENCY AND DURATION 08/22/2015 Speech Therapy Frequency (ACUTE ONLY) min 2x/week Treatment Duration 1 week      CHL IP ORAL PHASE 08/22/2015 Oral Phase WFL Oral - Pudding Teaspoon -- Oral - Pudding Cup -- Oral - Honey Teaspoon -- Oral - Honey Cup -- Oral - Nectar Teaspoon -- Oral - Nectar Cup -- Oral - Nectar Straw -- Oral - Thin Teaspoon -- Oral - Thin Cup -- Oral - Thin Straw -- Oral - Puree -- Oral - Mech Soft -- Oral - Regular -- Oral - Multi-Consistency -- Oral - Pill -- Oral Phase - Comment --  CHL IP PHARYNGEAL PHASE 08/22/2015 Pharyngeal Phase Impaired Pharyngeal- Pudding Teaspoon -- Pharyngeal -- Pharyngeal- Pudding Cup -- Pharyngeal -- Pharyngeal- Honey Teaspoon -- Pharyngeal -- Pharyngeal- Honey Cup -- Pharyngeal -- Pharyngeal- Nectar Teaspoon -- Pharyngeal -- Pharyngeal- Nectar Cup Delayed swallow initiation-vallecula Pharyngeal -- Pharyngeal- Nectar Straw -- Pharyngeal -- Pharyngeal- Thin Teaspoon -- Pharyngeal -- Pharyngeal- Thin Cup Delayed swallow initiation-vallecula;Pharyngeal residue - valleculae;Trace aspiration Pharyngeal -- Pharyngeal- Thin Straw Delayed swallow initiation-vallecula Pharyngeal -- Pharyngeal- Puree WFL Pharyngeal -- Pharyngeal- Mechanical Soft -- Pharyngeal -- Pharyngeal- Regular WFL Pharyngeal -- Pharyngeal- Multi-consistency -- Pharyngeal -- Pharyngeal- Pill WFL Pharyngeal -- Pharyngeal Comment trace silent aspiration occurred after the swallow from residuals in valleculae  CHL IP CERVICAL ESOPHAGEAL PHASE 08/22/2015 Cervical  Esophageal Phase WFL Pudding Teaspoon -- Pudding Cup -- Honey Teaspoon -- Honey Cup -- Nectar Teaspoon -- Nectar Cup -- Nectar Straw -- Thin Teaspoon -- Thin Cup -- Thin Straw -- Puree -- Mechanical Soft -- Regular -- Multi-consistency -- Pill -- Cervical Esophageal Comment -- Thank you, Genene Churn, [place] PORTER,DABNEY 08/22/2015, 3:19 PM               Assessment: 55 year old male with multiple comorbidities, presenting this admission with probable pneumonia, acute on chronic respiratory failure in the setting of severe COPD at baseline. GI consulted due to anemia, likely IDA component and multifactorial in the setting of chronic disease. He has no overt GI bleeding whatsoever; negative hemoccult in ED. Notable, his Hgb was in the 5 range in the ER but there was concern for hemodilution due to PICC line and labs were redrawn. Hgb was up in 7 range therefore patient did not require transfusion. Previous GI notes indicated that he received 2 units of prbcs but this was actually cancelled. He has been maintaining in the 7 range currently. Upon review of historical labs, evidence of drifting Hgb began in Oct 2016; he had been maintaining a value of around 10, so this presentation is new. In the setting of chronic anticoagulation [facility]), concern remains for stuttering GI bleed anywhere in the GI tract. EGD fairly recent, as of Oct 2016 by Dr.  Schooler. However, upon further evaluation of procedure note, he had food in his stomach, which precluded complete evaluation of upper GI tract. He also has a history of Barrett's esophagus noted in 2011; recent EGD in 2016 without any biopsies completed. Last colonoscopy in 2012 normal.   He will definitely need GI evaluation due to new onset anemia; at this point, would recommend a colonoscopy with repeat EGD for complete evaluation of upper GI tract and biopsy, as prior EGD was not entirely complete. Capsule study could be considered if no obvious source  for anemia at a later date. For now, he is not wanting inpatient endoscopic evaluation because he feels he is not strong enough.   Elevated LFTs: in setting of fatty liver. He denies any prior known history of chronic liver disease. [place] without ascites. He has notable lower extremity edema on exam. ECHO reviewed from Jan 2017 with EF of 60-65%. No stigmata of portal hypertension on recent EGD. Overall, LFTs continue to improve.serologies negative. His anemia continues to improve and was hgb 8.9 this morning (from 8.4 yesterday and from 7.1 admission low). Hepatic function panel shows continued improvement yesterday  Clinically improved, remains high risk sedation candidate at this time due to respiratory status. Still on 7 lpm per nasal cannula. Hgb trending upward daily, no signs of obvious bleeding. Mild tenderness possibly due to work of breathing during hospitalization.   Plan: 1. Outpatient GI evaluation approximately 2 weeks after discharge to evaluate for possible Colonoscopy/EGD 2. Continued PPI 3. Notify of any signs of overt bleeding 4. Continue to trend H/H 5. We will follow peripherally, please contact us for any acute changes    Walden Field, AGNP-C Adult & Gerontological Nurse Practitioner [facility]    LOS: 7 days    08/24/2015, 9:06 AM

## 2015-08-24 NOTE — Progress Notes (Signed)
Pt's CBG = 548 prior to lunch.  Hyperglycemia protocol initiate.  STAT serum glucose obtained and results =572.  Dr. Cindie Laroche paged and made aware.  No new orders received at this time.  Will give scheduled novolog per previous order and will continue to monitor patient.

## 2015-08-24 NOTE — Progress Notes (Signed)
Speech Language Pathology Treatment: Dysphagia  Patient Details Name: GREEN QUINCY MRN: [id number] DOB: 1960-10-06 Today's Date: 08/24/2015 Time: 5038-8828 SLP Time Calculation (min) (ACUTE ONLY): 14 min  Assessment / Plan / Recommendation Clinical Impression  Pt and nursing report improved diet tolerance with nectar thick liquids and dysphagia 3 (mechanical soft) consistencies. Pt still implementing supraglottic swallow strategy (hold breath prior to and during swallow) with liquid consumption which he reports has helped reduce coughing spells. No overt signs or symtpoms of reduced airway protection with PO trials this date. Continue dysphagia 3 and nectar thick diet with [employer] Protocol (water after oral care in between meals). ST to continue to monitor.    HPI HPI: JACOREY DONAWAY is a 55 y.o. year old male with a history of COPD, presenting with increasing shortness of breath and hypoxia. O2 sats were 70% in the field prior to arrival. Findings of questionable pneumonia on chest xray. History of MRSA bacteremia in Jan 2017, requiring prolonged admission. GI consulted due to IDA. Denies any overt GI bleeding to include melena, hematochezia, hematemesis. Hemoccult negative in ED. Very rare nausea. Has felt light-headed and dizzy. Notes chronic GERD, on Protonix BID as outpatient. No dysphagia. No abdominal pain. Denies diarrhea, constipation. He denies any weight loss. Has a good appetite. Notes lower extremity pitting edema and feels like his abdomen is distended as well. He recently had an EGD by Dr. Michail Sermon in Oct 2016 with minimal erosive esophagitis and small hiatal hernia. However, he had food in his stomach, which precluded complete evaluation of upper GI tract. History of Barrett's on EGD in 2011. Elevated LFTs; in setting of fatty liver. He denies any prior known history of chronic liver disease. GI following and may do EGD once stable from respiratory standpoint. Dr. Luan Pulling  requested SLP for swallowing evaluation.      SLP Plan  Continue with current plan of care     Recommendations  Diet recommendations: Dysphagia 3 (mechanical soft);Nectar-thick liquid [employer] Protocol (water after oral care between meals)) Liquids provided via: Cup;Straw Medication Administration: Whole meds with liquid Supervision: Patient able to self feed;Intermittent supervision to cue for compensatory strategies Compensations: Slow rate;Small sips/bites;Multiple dry swallows after each bite/sip;Other (Comment) Postural Changes and/or Swallow Maneuvers: Hold breath before and during swallow (Supraglottic swallow)             Oral Care Recommendations: Oral care BID Plan: Continue with current plan of care     [place] MA, [place] Language Pathologist    [name] 08/24/2015, 3:58 PM

## 2015-08-24 NOTE — Progress Notes (Signed)
Inpatient Diabetes Program Recommendations  AACE/ADA: New Consensus Statement on Inpatient Glycemic Control (2015)  Target Ranges:  Prepandial:   less than 140 mg/dL      Peak postprandial:   less than 180 mg/dL (1-2 hours)      Critically ill patients:  140 - 180 mg/dL  Results for Ryan [name], Ryan [name] (MRN [id number]) as of 08/24/2015 11:00  Ref. Range 08/23/2015 07:48 08/23/2015 12:00 08/23/2015 16:42 08/23/2015 21:34 08/24/2015 07:35  Glucose-Capillary Latest Ref Range: 65-99 mg/dL 322 (H) 294 (H) 159 (H) 215 (H) 322 (H)   Review of Glycemic Control  Diabetes history: DM2 Outpatient Diabetes medications: Metformin 500 mg QAM, Novolog 0-20 units TID with meals Current orders for Inpatient glycemic control: Lantus 35 units daily, Novolog 0-20 units TID with meals, Novolog 0-5 units QHS, Metformin 500 mg QAM, Novolog 8 units TID with meals for meal coverage  Inpatient Diabetes Program Recommendations: Insulin - Basal: If steroids are continued as ordered (Solumedrol 60 mg Q12H), please consider increasing Lantus to 40 units daily. Insulin - Meal Coverage: If steroids are continued as ordered, please consider increasing meal coverage to Novolog 13 units TID with meals.  Thanks, Barnie Alderman, RN, MSN, CDE Diabetes Coordinator Inpatient Diabetes Program [phone] (Team Pager from [place] to [place]) [phone] ([place] office) [phone] [facility] office) [phone] [facility] office)

## 2015-08-24 NOTE — Progress Notes (Signed)
Patient currently on vancomycin and cefepime with high flow O2 as well as Solu-Medrol 60 every 12 IV, saturating in 4 500s with his insulin-dependent diabetes initial prednisone and we'll diminish Solu-Medrol 40 every 12 obtain be met and CBC in a.m. Ryan [name]:[id number] DOB: 57-Dec-1962 DOA: 08/17/2015 PCP: Ryan Bogus, MD             Physical Exam: Blood pressure 120/71, pulse 96, temperature 97.9 F (36.6 C), temperature source Oral, resp. rate 15, height _0  (1.727 m), weight 161 lb 2.5 oz (73.1 kg), SpO2 92 %. lungs show diminished breath sounds in the bases prolonged his return expiratory phase mild end expiratory wheeze no rhonchi appreciable heart regular rhythm no S3 or S4 no heaves thrills rubs abdomen soft nontender bowel sounds normoactive   Investigations:  Recent Results (from the past 240 hour(s))  Culture, blood (routine x 2) Call MD if unable to obtain prior to antibiotics being given     Status: None   Collection Time: 08/17/15  2:21 PM  Result Value Ref Range Status   Specimen Description BLOOD RIGHT ANTECUBITAL  Final   Special Requests BOTTLES DRAWN AEROBIC AND ANAEROBIC 6CC  Final   Culture NO GROWTH 5 DAYS  Final   Report Status 08/22/2015 FINAL  Final  Culture, blood (routine x 2) Call MD if unable to obtain prior to antibiotics being given     Status: None   Collection Time: 08/17/15  2:28 PM  Result Value Ref Range Status   Specimen Description BLOOD LEFT HAND  Final   Special Requests BOTTLES DRAWN AEROBIC AND ANAEROBIC 6CC  Final   Culture NO GROWTH 5 DAYS  Final   Report Status 08/22/2015 FINAL  Final  MRSA PCR Screening     Status: None   Collection Time: 08/17/15  7:00 PM  Result Value Ref Range Status   MRSA by PCR NEGATIVE NEGATIVE Final    Comment:        The GeneXpert MRSA Assay (FDA approved for NASAL specimens only), is one component of a comprehensive MRSA colonization surveillance program. It is not intended to diagnose  MRSA infection nor to guide or monitor treatment for MRSA infections.   Culture, respiratory (NON-Expectorated)     Status: None   Collection Time: 08/18/15  4:16 PM  Result Value Ref Range Status   Specimen Description SPUTUM EXPECTORATED  Final   Special Requests NONE  Final   Gram Stain   Final    RARE WBC PRESENT, PREDOMINANTLY PMN FEW SQUAMOUS EPITHELIAL CELLS PRESENT RARE GRAM POSITIVE COCCI IN PAIRS Performed at [facility]    Culture   Final    NORMAL OROPHARYNGEAL FLORA Performed at [facility]    Report Status 08/21/2015 FINAL  Final     Basic Metabolic Panel:  Recent Labs  08/22/15 0515 08/23/15 0608 08/24/15 0649 08/24/15 1200  NA 139 139 140  --   K 3.3* 4.0 4.3  --   CL 96* 97* 97*  --   CO2 34* 35* 33*  --   GLUCOSE 313* 304* 232* 572*  BUN 34* 36* 37*  --   CREATININE 1.04 1.05 1.10  --   CALCIUM 7.9* 8.1* 8.3*  --   MG 2.2  --   --   --    Liver Function Tests:  Recent Labs  08/23/15 0939  AST 32  ALT 117*  ALKPHOS 76  BILITOT 0.5  PROT 5.9*  ALBUMIN 2.5*  CBC:  Recent Labs  08/23/15 0608 08/24/15 0649  WBC 9.2 12.1*  NEUTROABS 7.4 10.2*  HGB 8.4* 8.9*  HCT 27.3* 29.2*  MCV 76.7* 77.0*  PLT 314 313    Dg Swallowing Func-speech Pathology  08/22/2015  Ryan [name], CCC-SLP     08/22/2015 10:42 PM Objective Swallowing Evaluation: Type of Study: MBS-Modified Barium Swallow Study Patient Details Name: Ryan [name] MRN: [id number] Date of Birth: Jan 03, 1961 Today's Date: 08/22/2015 Time: SLP Start Time (ACUTE ONLY): 1310-SLP Stop Time (ACUTE ONLY): 1430 SLP Time Calculation (min) (ACUTE ONLY): 80 min Past Medical History: Past Medical History Diagnosis Date . COPD (chronic obstructive pulmonary disease) ([place])  . Hypercholesteremia  . History of pneumonia  . Polycythemia  . Coronary atherosclerosis of native coronary artery    Mild nonobstructive disease at catheterization 2007 . Cavitary lesion of lung 05/07/2011    Cultures grew MAI, tx antibiotics . Borderline diabetes  . Seizures ([place])    Last seizure 2 yrs ago . GERD (gastroesophageal reflux disease)  . Type 2 diabetes mellitus ([place])  . DDD (degenerative disc disease)    Cervical and thoracic . Chronic back pain  . Bronchitis  . Chronic left shoulder pain  . Chronic neck pain  . Atrial fibrillation ([place])    Not anticoagulated . Smoker  . Type 2 diabetes mellitus without complication ([place])  . CAP (community acquired pneumonia) 07/10/2013   04/2015 . Collagen vascular disease ([place])  . Chronic respiratory failure ([place])  . Barrett's esophagus  Past Surgical History: Past Surgical History Procedure Laterality Date . Colonoscopy  2012   Dr. Posey Pronto: normal . Vasectomy   . Throat biopsy   . Lung biopsy   . Vasectomy  1987 . Esophagogastroduodenoscopy N/A 05/04/2015   Dr. Michail Sermon: minimal erosive esophagitis, small hiatal hernia. FOOD PRECLUDED A COMPLETE EXAM. No biopsies taken . Esophagogastroduodenoscopy  2011   [place]: Barrett's  HPI: Ryan [name] is a 55 y.o. year old male with a history of COPD, presenting with increasing shortness of breath and hypoxia. O2 sats were 70% in the field prior to arrival. Findings of questionable pneumonia on chest xray. History of MRSA bacteremia in Jan 2017, requiring prolonged admission. GI consulted due to IDA. Denies any overt GI bleeding to include melena, hematochezia, hematemesis. Hemoccult negative in ED. Very rare nausea. Has felt light-headed and dizzy. Notes chronic GERD, on Protonix BID as outpatient. No dysphagia. No abdominal pain. Denies diarrhea, constipation. He denies any weight loss. Has a good appetite. Notes lower extremity pitting edema and feels like his abdomen is distended as well. He recently had an EGD by Dr. Michail Sermon in Oct 2016 with minimal erosive esophagitis and small hiatal hernia. However, he had food in his stomach, which precluded complete evaluation of upper GI tract. History of Barrett's on EGD in 2011.  Elevated LFTs; in setting of fatty liver. He denies any prior known history of chronic liver disease. GI following and may do EGD once stable from respiratory standpoint. Dr. Luan Pulling requested SLP for swallowing evaluation. Subjective: Pt reports feeling ok Assessment / Plan / Recommendation CHL IP CLINICAL IMPRESSIONS 08/22/2015 Therapy Diagnosis Mild pharyngeal phase dysphagia Clinical Impression Mild pharyngeal phase with min delay in swallow initiation and trace residuals in valleculae after the swallow with thins. Pt with one single episode of trace, silent aspiration of thins when he inhaled just after primary swallow and before secondary swallow elicited. Strong coughing episode did eventually occur after several seconds when aspirate dropped deeper into  trachea (suspected). Pt was challenged throughout the rest of the evaluation (straw sips, mixed consistencies, sequential swallows), however no further penetration or aspiration observed. Pt did continue to have mild vallecular residue (lingual coating dripped down) after thins, but no further aspiration visualized. Suspect pt has transient episodes of aspiration over the course of a meal and possibly with pooled secretions at times given pt/wife report of strong coughing episodes where he cannot catch his breath for several minutes. Many pt's with severe COPD have decreased sensation for residuals in pharynx/near airway and are therefore at higher risk for aspiration. Given frequent bouts of PNA, increased oxygen demands, severe COPD, and increased work of breath during meals, recommend trial period of nectar-thick liquids to see if symptoms can be alleviated. This was discussed at length with pt and wife and they are in agreement with plan. Pt continues to be at risk for aspiration of oral secretions (dripping down to vallecular space) and reportedly has GERD, however I am hopeful that over the course of a meal, aspiration of liquids will be reduced with  liquid consistency modification and implementation of swallow strategies (breath hold, swallow, exhale, swallow again). SLP provided written information to pt/wife and will follow up while in acute setting. Pt currently on fluid restriction per cardiology, however SLP will recommend [facility] water protocol (oral care followed by free water only (no other thin liquids) between meals while in adherence to fluid restrictions. Recommend that pt thicken all other liquids to nectar-consistency for 3-4 weeks with reassessment of needs at that time. Pt given SLP phone number to remain in contact should he have further questions after discharge. Impact on safety and function Moderate aspiration risk   CHL IP TREATMENT RECOMMENDATION 08/22/2015 Treatment Recommendations Therapy as outlined in treatment plan below   Prognosis 08/22/2015 Prognosis for Safe Diet Advancement Fair Barriers to Reach Goals (No Data) Barriers/Prognosis Comment -- CHL IP DIET RECOMMENDATION 08/22/2015 SLP Diet Recommendations Dysphagia 3 (Mech soft) solids;Nectar thick liquid;Free water protocol after oral care Liquid Administration via Cup Medication Administration Whole meds with puree Compensations Slow rate;Small sips/bites;Multiple dry swallows after each bite/sip Postural Changes Remain semi-upright after after feeds/meals (Comment);Seated upright at 90 degrees   CHL IP OTHER RECOMMENDATIONS 08/22/2015 Recommended Consults -- Oral Care Recommendations Oral care BID;Patient independent with oral care Other Recommendations Clarify dietary restrictions;Order thickener from pharmacy   CHL IP FOLLOW UP RECOMMENDATIONS 08/22/2015 Follow up Recommendations None   CHL IP FREQUENCY AND DURATION 08/22/2015 Speech Therapy Frequency (ACUTE ONLY) min 2x/week Treatment Duration 1 week      CHL IP ORAL PHASE 08/22/2015 Oral Phase WFL Oral - Pudding Teaspoon -- Oral - Pudding Cup -- Oral - Honey Teaspoon -- Oral - Honey Cup -- Oral - Nectar Teaspoon -- Oral - Nectar Cup --  Oral - Nectar Straw -- Oral - Thin Teaspoon -- Oral - Thin Cup -- Oral - Thin Straw -- Oral - Puree -- Oral - Mech Soft -- Oral - Regular -- Oral - Multi-Consistency -- Oral - Pill -- Oral Phase - Comment --  CHL IP PHARYNGEAL PHASE 08/22/2015 Pharyngeal Phase Impaired Pharyngeal- Pudding Teaspoon -- Pharyngeal -- Pharyngeal- Pudding Cup -- Pharyngeal -- Pharyngeal- Honey Teaspoon -- Pharyngeal -- Pharyngeal- Honey Cup -- Pharyngeal -- Pharyngeal- Nectar Teaspoon -- Pharyngeal -- Pharyngeal- Nectar Cup Delayed swallow initiation-vallecula Pharyngeal -- Pharyngeal- Nectar Straw -- Pharyngeal -- Pharyngeal- Thin Teaspoon -- Pharyngeal -- Pharyngeal- Thin Cup Delayed swallow initiation-vallecula;Pharyngeal residue - valleculae;Trace aspiration Pharyngeal -- Pharyngeal- Thin Straw Delayed swallow  initiation-vallecula Pharyngeal -- Pharyngeal- Puree WFL Pharyngeal -- Pharyngeal- Mechanical Soft -- Pharyngeal -- Pharyngeal- Regular WFL Pharyngeal -- Pharyngeal- Multi-consistency -- Pharyngeal -- Pharyngeal- Pill WFL Pharyngeal -- Pharyngeal Comment trace silent aspiration occurred after the swallow from residuals in valleculae  CHL IP CERVICAL ESOPHAGEAL PHASE 08/22/2015 Cervical Esophageal Phase WFL Pudding Teaspoon -- Pudding Cup -- Honey Teaspoon -- Honey Cup -- Nectar Teaspoon -- Nectar Cup -- Nectar Straw -- Thin Teaspoon -- Thin Cup -- Thin Straw -- Puree -- Mechanical Soft -- Regular -- Multi-consistency -- Pill -- Cervical Esophageal Comment -- Thank you, Genene Churn, CCC-SLP [phone] PORTER,DABNEY 08/22/2015, 3:19 PM                 Medications:  Impression:  Principal Problem:   Acute respiratory failure with hypoxia ([place]) Active Problems:   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  quit smoking 05/01/15    Type 2 diabetes mellitus without complication ([facility])   Decompensated COPD with exacerbation (chronic obstructive pulmonary disease) ([facility])   Atrial fibrillation with rapid ventricular response ([facility])    Protein-calorie malnutrition, severe ([facility])   Pulmonary fibrosis ([facility])   Chronic respiratory failure with hypoxia ([facility])   COPD (chronic obstructive pulmonary disease) ([facility])   Elevated liver enzymes   Absolute anemia   Hypokalemia   Edema   Anemia of chronic disease     Plan: Decrease I'm mental to 40 mg IV every 12 hours obtain CBC and be met in a.m. continue high flow O2   Consultants: Gastroenterology   Procedures   Antibiotics: Vancomycin and cefepime                  Code Status:   Family Communication:    Disposition Plan   Time spent: 30 minutes   LOS: 7 days   Alysah Carton M   08/24/2015, 12:44 PM

## 2015-08-24 NOTE — Care Management Important Message (Signed)
Important Message  Patient Details  Name: Ryan [name] MRN: [id number] Date of Birth: 03-18-1961   Medicare Important Message Given:  Yes    Alvie Heidelberg, RN 08/24/2015, 9:31 AM

## 2015-08-25 LAB — CBC WITH DIFFERENTIAL/PLATELET
BASOS ABS: 0 10*3/uL (ref 0.0–0.1)
BASOS PCT: 0 %
Eosinophils Absolute: 0 10*3/uL (ref 0.0–0.7)
Eosinophils Relative: 0 %
HEMATOCRIT: 27.4 % — AB (ref 39.0–52.0)
HEMOGLOBIN: 8.6 g/dL — AB (ref 13.0–17.0)
Lymphocytes Relative: 8 %
Lymphs Abs: 1 10*3/uL (ref 0.7–4.0)
MCH: 24.3 pg — ABNORMAL LOW (ref 26.0–34.0)
MCHC: 31.4 g/dL (ref 30.0–36.0)
MCV: 77.4 fL — ABNORMAL LOW (ref 78.0–100.0)
Monocytes Absolute: 1.2 10*3/uL — ABNORMAL HIGH (ref 0.1–1.0)
Monocytes Relative: 9 %
NEUTROS ABS: 10.9 10*3/uL — AB (ref 1.7–7.7)
NEUTROS PCT: 83 %
Platelets: 322 10*3/uL (ref 150–400)
RBC: 3.54 MIL/uL — AB (ref 4.22–5.81)
RDW: 22.2 % — ABNORMAL HIGH (ref 11.5–15.5)
WBC: 13.2 10*3/uL — AB (ref 4.0–10.5)

## 2015-08-25 LAB — GLUCOSE, CAPILLARY
GLUCOSE-CAPILLARY: 321 mg/dL — AB (ref 65–99)
GLUCOSE-CAPILLARY: 271 mg/dL — AB (ref 65–99)

## 2015-08-25 LAB — BASIC METABOLIC PANEL
ANION GAP: 9 (ref 5–15)
BUN: 39 mg/dL — ABNORMAL HIGH (ref 6–20)
CHLORIDE: 95 mmol/L — AB (ref 101–111)
CO2: 33 mmol/L — AB (ref 22–32)
Calcium: 8.4 mg/dL — ABNORMAL LOW (ref 8.9–10.3)
Creatinine, Ser: 1.12 mg/dL (ref 0.61–1.24)
GFR calc non Af Amer: >60 mL/min (ref >60)
Glucose, Bld: 396 mg/dL — ABNORMAL HIGH (ref 65–99)
Potassium: 5 mmol/L (ref 3.5–5.1)
Sodium: 137 mmol/L (ref 135–145)

## 2015-08-25 MED ORDER — INSULIN GLARGINE 100 UNIT/ML ~~LOC~~ SOLN: 35.0000 [IU] | Freq: Every day | SUBCUTANEOUS | Status: DC

## 2015-08-25 MED ORDER — VARENICLINE TARTRATE 0.5 MG PO TABS: 0.5000 mg | ORAL_TABLET | Freq: Two times a day (BID) | ORAL | Status: DC

## 2015-08-25 NOTE — Progress Notes (Signed)
Patient has active home health services with [facility], per client [facility] managing PICC line care. PICC line left in place at discharge awaiting MD follow up on Monday.

## 2015-08-25 NOTE — Progress Notes (Signed)
Reviewed discharge instructions with patient and cg, instructed on s/s to report and to follow up with PCP. Understanding voiced. Prescriptions given for Lantus and Chantix. Patient escorted to vehicle via wheelchair and discharged to home with family.

## 2015-08-25 NOTE — Discharge Summary (Signed)
Physician Discharge Summary  Ryan [name] EVO:[id number] DOB: [birth year] DOA: 08/17/2015  PCP: Alonza Bogus, MD  Admit date: 08/17/2015 Discharge date: 08/25/2015   Recommendations for Outpatient Follow-up:  The patient is discharged to his home high flow 7 L/m nasal O2 his antibiotics have been discontinued as he received a 9 day course of cefepime his vancomycin is discontinued after 32 days for MRSA bacteremia he shows no signs of sepsis the additional medicines he will take an top of his admission medicines of Lantus 35 units subcutaneous at bedtime daily as well as Chantix 0.5 mg by mouth twice a day for smoking cessation patient is to take 40 mg of prednisone daily in the form of 10 mg 4 tablets by mouth daily he is likewise to follow-up with Dr. 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  quit smoking 05/01/15    Type 2 diabetes mellitus without complication (HCC)   Decompensated COPD with exacerbation (chronic obstructive pulmonary disease) (HCC)   Atrial fibrillation with rapid ventricular response (HCC)   Protein-calorie malnutrition, severe (HCC)   Pulmonary fibrosis (HCC)   Chronic respiratory failure with hypoxia (HCC)   COPD (chronic obstructive pulmonary disease) (HCC)   Elevated liver enzymes   Absolute anemia   Hypokalemia   Edema   Anemia of chronic disease   Discharge Condition: Good and improving  Filed Weights   08/20/15 0500 08/21/15 0500 08/22/15 0500  Weight: 162 lb 11.2 oz (73.8 kg) 157 lb 6.5 oz (71.4 kg) 161 lb 2.5 oz (73.1 kg)    History of present illness:  Patient is to seizure white male with end-stage COPD continue smoking at home who was admitted course of being treated with vancomycin for 30 days for MRSA bacteremia and had a streaking left lower lobe infiltrate with hypoxia worsening dyspnea for which she  was treated with empiric Maxipime for 9 day course while in hospital he continued his high flow O2 vancomycin was continued likewise first MRSA bacteremia this was discontinued on the day of discharge. Reached total of 32 days complete therapy with vancomycin cefepime likewise was discontinued he patient continues to improve his see a poor glycemic control as well as some steroids he had Lantus 35 units subsequently added to his medical regimen along with Chantix 0.5 mg by mouth twice a day for smoking cessation and to take this and will follow-up with Dr. Luan Pulling in 2-5 days' time is to continue his 7 L nasal high flow O2 saturations on this rate were 96% while in hospital on the last 2 days of admission  Hospital Course:  See history of present illness  Procedures:    Consultations:    Discharge Instructions  Discharge Instructions    Discharge instructions    Complete by:  As directed      Discharge instructions    Complete by:  As directed             Medication List    STOP taking these medications        vancomycin in sodium chloride 0.9 % 250 mL      TAKE these medications        baclofen 10 MG tablet  Commonly known as:  LIORESAL  Take 10 mg by mouth 3 (three) times daily.     benzonatate 200 MG capsule  Commonly known as:  TESSALON  Take 200 mg by mouth 3 (three) times daily.     Dextromethorphan-Guaifenesin 20-400 MG Tabs  Take 1 tablet by mouth every morning.     diltiazem 120 MG 24 hr capsule  Commonly known as:  CARDIZEM CD  Take 1 capsule (120 mg total) by mouth daily.     diphenhydrAMINE 25 MG tablet  Commonly known as:  BENADRYL  Take 50 mg by mouth every morning.     edoxaban 60 MG Tabs tablet  Commonly known as:  SAVAYSA  Take 60 mg by mouth daily.     fluticasone 50 MCG/ACT nasal spray  Commonly known as:  FLONASE  Place 2 sprays into both nostrils daily as needed for allergies.     furosemide 40 MG tablet  Commonly known as:  LASIX    Take 40 mg by mouth 2 (two) times daily.     insulin aspart 100 UNIT/ML injection  Commonly known as:  novoLOG  Inject 0-20 Units into the skin 3 (three) times daily with meals.     insulin glargine 100 UNIT/ML injection  Commonly known as:  LANTUS  Inject 0.35 mLs (35 Units total) into the skin daily.     levalbuterol 0.63 MG/3ML nebulizer solution  Commonly known as:  XOPENEX  Take 3 mLs (0.63 mg total) by nebulization 3 (three) times daily.     LORazepam 0.5 MG tablet  Commonly known as:  ATIVAN  Take 1 tablet (0.5 mg total) by mouth 2 (two) times daily as needed for anxiety or sleep.     Magnesium 500 MG Tabs  Take 500 mg by mouth daily.     metFORMIN 500 MG tablet  Commonly known as:  GLUCOPHAGE  Take 1 tablet (500 mg total) by mouth daily with breakfast.     montelukast 10 MG tablet  Commonly known as:  SINGULAIR  Take 10 mg by mouth at bedtime.     nicotine 14 mg/24hr patch  Commonly known as:  NICODERM CQ - dosed in mg/24 hours  Place 14 mg onto the skin daily.     nicotine 21 mg/24hr patch  Commonly known as:  NICODERM CQ - dosed in mg/24 hours  Place 1 patch (21 mg total) onto the skin daily.     nitroGLYCERIN 0.4 MG SL tablet  Commonly known as:  NITROSTAT  Place 1 tablet (0.4 mg total) under the tongue every 5 (five) minutes as needed for chest pain.     oxyCODONE-acetaminophen 10-325 MG tablet  Commonly known as:  PERCOCET  Take 1 tablet by mouth every 4 (four) hours as needed for pain.     pantoprazole 40 MG tablet  Commonly known as:  PROTONIX  Take 1 tablet (40 mg total) by mouth 2 (two) times daily.     PHARMACIST CHOICE LANCETS Misc  USE 2 TIMES DAILY FOR DIABETIC TESTING     potassium chloride SA 20 MEQ tablet  Commonly known as:  K-DUR,KLOR-CON  Take 20 mEq by mouth 2 (two) times daily.     predniSONE 10 MG tablet  Commonly known as:  DELTASONE  Take 4 tablets (40 mg total) by mouth daily with breakfast.     SPIRIVA RESPIMAT 2.5  MCG/ACT Aers  Generic drug:  Tiotropium Bromide Monohydrate  Inhale 1 puff into the lungs daily.     varenicline 0.5 MG tablet  Commonly known as:  CHANTIX  Take 1 tablet (0.5 mg total) by mouth 2 (two) times daily.  Start taking  on:  08/26/2015     VENTOLIN HFA 108 (90 Base) MCG/ACT inhaler  Generic drug:  albuterol  Inhale 2 puffs into the lungs every 6 (six) hours as needed for wheezing or shortness of breath.       Allergies  Allergen Reactions  . Albuterol Palpitations  . Influenza Vaccine Live Swelling      The results of significant diagnostics from this hospitalization (including imaging, microbiology, ancillary and laboratory) are listed below for reference.    Significant Diagnostic Studies: US Abdomen Complete  08/20/2015  CLINICAL DATA:  Elevated LFTs, upper abdominal pain EXAM: ABDOMEN ULTRASOUND COMPLETE COMPARISON:  CT abdomen pelvis dated 05/01/2015 FINDINGS: Gallbladder: No gallstones, gallbladder wall thickening, or pericholecystic fluid. Negative sonographic Murphy's sign. Common bile duct: Diameter: 4 mm Liver: Coarse hepatic echotexture with hyperechoic hepatic parenchyma, nonspecific but likely reflecting hepatic steatosis. No focal hepatic lesion is seen. IVC: No abnormality visualized. Pancreas: Visualized portion unremarkable. Spleen: Size and appearance within normal limits. Right Kidney: Length: 12.3 cm.  No mass or hydronephrosis. Left Kidney: Length: 12.7 cm.  No mass or hydronephrosis. Abdominal aorta: No aneurysm visualized. Other findings: None. IMPRESSION: Suspected hepatic steatosis. Otherwise negative abdominal ultrasound. Electronically Signed   By: Julian Hy M.D.   On: 08/20/2015 11:56   US Venous Img Lower Bilateral  08/20/2015  CLINICAL DATA:  Lower extremity swelling for 1 month EXAM: BILATERAL LOWER EXTREMITY VENOUS DOPPLER ULTRASOUND TECHNIQUE: Gray-scale sonography with graded compression, as well as color Doppler and duplex ultrasound  were performed to evaluate the lower extremity deep venous systems from the level of the common femoral vein and including the common femoral, femoral, profunda femoral, popliteal and calf veins including the posterior tibial, peroneal and gastrocnemius veins when visible. The superficial great saphenous vein was also interrogated. Spectral Doppler was utilized to evaluate flow at rest and with distal augmentation maneuvers in the common femoral, femoral and popliteal veins. COMPARISON:  None. FINDINGS: RIGHT LOWER EXTREMITY Common Femoral Vein: No evidence of thrombus. Normal compressibility, respiratory phasicity and response to augmentation. Saphenofemoral Junction: No evidence of thrombus. Normal compressibility and flow on color Doppler imaging. Profunda Femoral Vein: No evidence of thrombus. Normal compressibility and flow on color Doppler imaging. Femoral Vein: No evidence of thrombus. Normal compressibility, respiratory phasicity and response to augmentation. Popliteal Vein: No evidence of thrombus. Normal compressibility, respiratory phasicity and response to augmentation. Calf Veins: No evidence of thrombus. Normal compressibility and flow on color Doppler imaging. Superficial Great Saphenous Vein: No evidence of thrombus. Normal compressibility and flow on color Doppler imaging. Venous Reflux:  None. Other Findings:  Mild subcutaneous edema is noted. LEFT LOWER EXTREMITY Common Femoral Vein: No evidence of thrombus. Normal compressibility, respiratory phasicity and response to augmentation. Saphenofemoral Junction: No evidence of thrombus. Normal compressibility and flow on color Doppler imaging. Profunda Femoral Vein: No evidence of thrombus. Normal compressibility and flow on color Doppler imaging. Femoral Vein: No evidence of thrombus. Normal compressibility, respiratory phasicity and response to augmentation. Popliteal Vein: No evidence of thrombus. Normal compressibility, respiratory phasicity and  response to augmentation. Calf Veins: No evidence of thrombus. Normal compressibility and flow on color Doppler imaging. Superficial Great Saphenous Vein: No evidence of thrombus. Normal compressibility and flow on color Doppler imaging. Venous Reflux:  None. Other Findings: Mild subcutaneous edema is noted. 3.9 cm lymph node is noted in the left groin. This is new from the prior exam of October 2016. IMPRESSION: No evidence of deep venous thrombosis. Bilateral lower extremity subcutaneous edema. Prominent  lymph node in the left inguinal region likely reactive in nature. Clinical correlation is recommended. Electronically Signed   By: Inez Catalina M.D.   On: 08/20/2015 11:49   Dg Chest Portable 1 View  08/17/2015  CLINICAL DATA:  Shortness of breath. Pneumonia. Subsequent encounter. EXAM: PORTABLE CHEST 1 VIEW COMPARISON:  Single view of the chest 07/26/2015. FINDINGS: Endotracheal tube and NG tube been removed. Small right pleural effusion is decreased in size since the comparison study. There is streaky right basilar airspace disease. Mild left basilar atelectasis is unchanged. The lungs are markedly emphysematous. Scar in the left upper lobe is not well demonstrated due to overlying tubes. No pneumothorax. Heart size normal. IMPRESSION: Decreased right pleural effusion with persistent streaky right basilar airspace opacity which is worrisome for pneumonia. Emphysema. Electronically Signed   By: Inge Rise M.D.   On: 08/17/2015 11:17   Dg Foot 2 Views Left  08/20/2015  CLINICAL DATA:  LEFT foot pain and swelling EXAM: LEFT FOOT - 2 VIEW COMPARISON:  Portable exam 1413 hours compared to 05/01/2015 FINDINGS: Bones appear demineralized. Joint spaces preserved. Displaced fracture fragment identified at distal lateral aspect of the calcaneus at calcaneal cuboid joint. No additional fracture, dislocation or bone destruction. Diffuse soft tissue swelling LEFT foot greatest at dorsum. IMPRESSION: Subacute distal  LEFT calcaneal fracture extending intact calcaneocuboid joint. Marked soft tissue swelling without additional acute bony abnormalities. Electronically Signed   By: Lavonia Dana M.D.   On: 08/20/2015 14:33   Dg Swallowing Func-speech Pathology  08/22/2015  Ephraim Hamburger, CCC-SLP     08/22/2015 10:42 PM Objective Swallowing Evaluation: Type of Study: MBS-Modified Barium Swallow Study Patient Details Name: Ryan [name] MRN: [id number] Date of Birth: 06-Feb-1961 Today's Date: 08/22/2015 Time: SLP Start Time (ACUTE ONLY): 1310-SLP Stop Time (ACUTE ONLY): 1430 SLP Time Calculation (min) (ACUTE ONLY): 80 min Past Medical History: Past Medical History Diagnosis Date . COPD (chronic obstructive pulmonary disease) ([place])  . Hypercholesteremia  . History of pneumonia  . Polycythemia  . Coronary atherosclerosis of native coronary artery    Mild nonobstructive disease at catheterization 2007 . Cavitary lesion of lung 05/07/2011   Cultures grew MAI, tx antibiotics . Borderline diabetes  . Seizures ([place])    Last seizure 2 yrs ago . GERD (gastroesophageal reflux disease)  . Type 2 diabetes mellitus ([place])  . DDD (degenerative disc disease)    Cervical and thoracic . Chronic back pain  . Bronchitis  . Chronic left shoulder pain  . Chronic neck pain  . Atrial fibrillation ([place])    Not anticoagulated . Smoker  . Type 2 diabetes mellitus without complication ([place])  . CAP (community acquired pneumonia) 07/10/2013   04/2015 . Collagen vascular disease ([place])  . Chronic respiratory failure ([place])  . Barrett's esophagus  Past Surgical History: Past Surgical History Procedure Laterality Date . Colonoscopy  2012   Dr. Posey Pronto: normal . Vasectomy   . Throat biopsy   . Lung biopsy   . Vasectomy  1987 . Esophagogastroduodenoscopy N/A 05/04/2015   Dr. Michail Sermon: minimal erosive esophagitis, small hiatal hernia. FOOD PRECLUDED A COMPLETE EXAM. No biopsies taken . Esophagogastroduodenoscopy  2011   [place]: Barrett's  HPI: Ryan [name] is a 55 y.o. year  old male with a history of COPD, presenting with increasing shortness of breath and hypoxia. O2 sats were 70% in the field prior to arrival. Findings of questionable pneumonia on chest xray. History of MRSA bacteremia in Jan 2017, requiring prolonged admission. GI  consulted due to [place]. Denies any overt GI bleeding to include melena, hematochezia, hematemesis. Hemoccult negative in ED. Very rare nausea. Has felt light-headed and dizzy. Notes chronic GERD, on Protonix BID as outpatient. No dysphagia. No abdominal pain. Denies diarrhea, constipation. He denies any weight loss. Has a good appetite. Notes lower extremity pitting edema and feels like his abdomen is distended as well. He recently had an EGD by Dr. Michail Sermon in Oct 2016 with minimal erosive esophagitis and small hiatal hernia. However, he had food in his stomach, which precluded complete evaluation of upper GI tract. History of Barrett's on EGD in 2011. Elevated LFTs; in setting of fatty liver. He denies any prior known history of chronic liver disease. GI following and may do EGD once stable from respiratory standpoint. Dr. Luan Pulling requested SLP for swallowing evaluation. Subjective: Pt reports feeling ok Assessment / Plan / Recommendation CHL IP CLINICAL IMPRESSIONS 08/22/2015 Therapy Diagnosis Mild pharyngeal phase dysphagia Clinical Impression Mild pharyngeal phase with min delay in swallow initiation and trace residuals in valleculae after the swallow with thins. Pt with one single episode of trace, silent aspiration of thins when he inhaled just after primary swallow and before secondary swallow elicited. Strong coughing episode did eventually occur after several seconds when aspirate dropped deeper into trachea (suspected). Pt was challenged throughout the rest of the evaluation (straw sips, mixed consistencies, sequential swallows), however no further penetration or aspiration observed. Pt did continue to have mild vallecular residue (lingual coating  dripped down) after thins, but no further aspiration visualized. Suspect pt has transient episodes of aspiration over the course of a meal and possibly with pooled secretions at times given pt/wife report of strong coughing episodes where he cannot catch his breath for several minutes. Many pt's with severe COPD have decreased sensation for residuals in pharynx/near airway and are therefore at higher risk for aspiration. Given frequent bouts of PNA, increased oxygen demands, severe COPD, and increased work of breath during meals, recommend trial period of nectar-thick liquids to see if symptoms can be alleviated. This was discussed at length with pt and wife and they are in agreement with plan. Pt continues to be at risk for aspiration of oral secretions (dripping down to vallecular space) and reportedly has GERD, however I am hopeful that over the course of a meal, aspiration of liquids will be reduced with liquid consistency modification and implementation of swallow strategies (breath hold, swallow, exhale, swallow again). SLP provided written information to pt/wife and will follow up while in acute setting. Pt currently on fluid restriction per cardiology, however SLP will recommend [facility] water protocol (oral care followed by free water only (no other thin liquids) between meals while in adherence to fluid restrictions. Recommend that pt thicken all other liquids to nectar-consistency for 3-4 weeks with reassessment of needs at that time. Pt given SLP phone number to remain in contact should he have further questions after discharge. Impact on safety and function Moderate aspiration risk   CHL IP TREATMENT RECOMMENDATION 08/22/2015 Treatment Recommendations Therapy as outlined in treatment plan below   Prognosis 08/22/2015 Prognosis for Safe Diet Advancement Fair Barriers to Reach Goals (No Data) Barriers/Prognosis Comment -- CHL IP DIET RECOMMENDATION 08/22/2015 SLP Diet Recommendations Dysphagia 3 (Mech soft)  solids;Nectar thick liquid;Free water protocol after oral care Liquid Administration via Cup Medication Administration Whole meds with puree Compensations Slow rate;Small sips/bites;Multiple dry swallows after each bite/sip Postural Changes Remain semi-upright after after feeds/meals (Comment);Seated upright at 90 degrees   CHL IP  OTHER RECOMMENDATIONS 08/22/2015 Recommended Consults -- Oral Care Recommendations Oral care BID;Patient independent with oral care Other Recommendations Clarify dietary restrictions;Order thickener from pharmacy   CHL IP FOLLOW UP RECOMMENDATIONS 08/22/2015 Follow up Recommendations None   CHL IP FREQUENCY AND DURATION 08/22/2015 Speech Therapy Frequency (ACUTE ONLY) min 2x/week Treatment Duration 1 week      CHL IP ORAL PHASE 08/22/2015 Oral Phase WFL Oral - Pudding Teaspoon -- Oral - Pudding Cup -- Oral - Honey Teaspoon -- Oral - Honey Cup -- Oral - Nectar Teaspoon -- Oral - Nectar Cup -- Oral - Nectar Straw -- Oral - Thin Teaspoon -- Oral - Thin Cup -- Oral - Thin Straw -- Oral - Puree -- Oral - Mech Soft -- Oral - Regular -- Oral - Multi-Consistency -- Oral - Pill -- Oral Phase - Comment --  CHL IP PHARYNGEAL PHASE 08/22/2015 Pharyngeal Phase Impaired Pharyngeal- Pudding Teaspoon -- Pharyngeal -- Pharyngeal- Pudding Cup -- Pharyngeal -- Pharyngeal- Honey Teaspoon -- Pharyngeal -- Pharyngeal- Honey Cup -- Pharyngeal -- Pharyngeal- Nectar Teaspoon -- Pharyngeal -- Pharyngeal- Nectar Cup Delayed swallow initiation-vallecula Pharyngeal -- Pharyngeal- Nectar Straw -- Pharyngeal -- Pharyngeal- Thin Teaspoon -- Pharyngeal -- Pharyngeal- Thin Cup Delayed swallow initiation-vallecula;Pharyngeal residue - valleculae;Trace aspiration Pharyngeal -- Pharyngeal- Thin Straw Delayed swallow initiation-vallecula Pharyngeal -- Pharyngeal- Puree WFL Pharyngeal -- Pharyngeal- Mechanical Soft -- Pharyngeal -- Pharyngeal- Regular WFL Pharyngeal -- Pharyngeal- Multi-consistency -- Pharyngeal -- Pharyngeal- Pill WFL  Pharyngeal -- Pharyngeal Comment trace silent aspiration occurred after the swallow from residuals in valleculae  CHL IP CERVICAL ESOPHAGEAL PHASE 08/22/2015 Cervical Esophageal Phase WFL Pudding Teaspoon -- Pudding Cup -- Honey Teaspoon -- Honey Cup -- Nectar Teaspoon -- Nectar Cup -- Nectar Straw -- Thin Teaspoon -- Thin Cup -- Thin Straw -- Puree -- Mechanical Soft -- Regular -- Multi-consistency -- Pill -- Cervical Esophageal Comment -- Thank you, Genene Churn, [place] Ryan [name],Ryan [name] 08/22/2015, 3:19 PM               Microbiology: Recent Results (from the past 240 hour(s))  Culture, blood (routine x 2) Call MD if unable to obtain prior to antibiotics being given     Status: None   Collection Time: 08/17/15  2:21 PM  Result Value Ref Range Status   Specimen Description BLOOD RIGHT ANTECUBITAL  Final   Special Requests BOTTLES DRAWN AEROBIC AND ANAEROBIC 6CC  Final   Culture NO GROWTH 5 DAYS  Final   Report Status 08/22/2015 FINAL  Final  Culture, blood (routine x 2) Call MD if unable to obtain prior to antibiotics being given     Status: None   Collection Time: 08/17/15  2:28 PM  Result Value Ref Range Status   Specimen Description BLOOD LEFT HAND  Final   Special Requests BOTTLES DRAWN AEROBIC AND ANAEROBIC [place]  Final   Culture NO GROWTH 5 DAYS  Final   Report Status 08/22/2015 FINAL  Final  MRSA PCR Screening     Status: None   Collection Time: 08/17/15  7:00 PM  Result Value Ref Range Status   MRSA by PCR NEGATIVE NEGATIVE Final    Comment:        The GeneXpert MRSA Assay (FDA approved for NASAL specimens only), is one component of a comprehensive MRSA colonization surveillance program. It is not intended to diagnose MRSA infection nor to guide or monitor treatment for MRSA infections.   Culture, respiratory (NON-Expectorated)     Status: None   Collection Time: 08/18/15  4:16 PM  Result Value Ref Range Status   Specimen Description SPUTUM EXPECTORATED  Final    Special Requests NONE  Final   Gram Stain   Final    RARE WBC PRESENT, PREDOMINANTLY PMN FEW SQUAMOUS EPITHELIAL CELLS PRESENT RARE GRAM POSITIVE COCCI IN PAIRS Performed at [facility]    Culture   Final    NORMAL OROPHARYNGEAL FLORA Performed at [facility]    Report Status 08/21/2015 FINAL  Final     Labs: Basic Metabolic Panel:  Recent Labs Lab 08/21/15 0545 08/22/15 0515 08/23/15 3419 08/24/15 0649 08/24/15 1200 08/25/15 0625  NA 141 139 139 140  --  137  K 3.2* 3.3* 4.0 4.3  --  5.0  CL 97* 96* 97* 97*  --  95*  CO2 33* 34* 35* 33*  --  33*  GLUCOSE 323* 313* 304* 232* 572* 396*  BUN 36* 34* 36* 37*  --  39*  CREATININE 1.25* 1.04 1.05 1.10  --  1.12  CALCIUM 8.0* 7.9* 8.1* 8.3*  --  8.4*  MG  --  2.2  --   --   --   --    Liver Function Tests:  Recent Labs Lab 08/19/15 0514 08/20/15 0521 08/21/15 0545 08/23/15 0939  AST 141* 63* 46* 32  ALT 301* 234* 187* 117*  ALKPHOS 88 75 71 76  BILITOT 0.4 0.3 0.4 0.5  PROT 5.9* 5.8* 5.9* 5.9*  ALBUMIN 2.3* 2.3* 2.3* 2.5*   No results for input(s): LIPASE, AMYLASE in the last 168 hours. No results for input(s): AMMONIA in the last 168 hours. CBC:  Recent Labs Lab 08/21/15 0545 08/22/15 0515 08/23/15 0608 08/24/15 0649 08/25/15 0625  WBC 6.5 7.4 9.2 12.1* 13.2*  NEUTROABS 4.5 5.5 7.4 10.2* 10.9*  HGB 7.6* 7.6* 8.4* 8.9* 8.6*  HCT 24.8* 24.6* 27.3* 29.2* 27.4*  MCV 76.3* 76.4* 76.7* 77.0* 77.4*  PLT 253 268 314 313 322   Cardiac Enzymes: No results for input(s): CKTOTAL, CKMB, CKMBINDEX, TROPONINI in the last 168 hours. BNP: BNP (last 3 results)  Recent Labs  07/15/15 1710 07/25/15 0828 08/17/15 1110  BNP 150.0* 113.0* 33.0    ProBNP (last 3 results) No results for input(s): PROBNP in the last 8760 hours.  CBG:  Recent Labs Lab 08/24/15 1340 08/24/15 1712 08/24/15 2153 08/25/15 0802 08/25/15 1134  GLUCAP 373* 188* 174* 321* 271*        Signed:  Taeya Theall M  [facility] Pager: [phone] 08/25/2015, 1:57 PM

## 2015-08-29 ENCOUNTER — Encounter (HOSPITAL_COMMUNITY): Payer: Self-pay | Admitting: Emergency Medicine

## 2015-08-29 ENCOUNTER — Emergency Department (HOSPITAL_COMMUNITY): Payer: Medicare Other

## 2015-08-29 ENCOUNTER — Inpatient Hospital Stay (HOSPITAL_COMMUNITY)
Admission: EM | Admit: 2015-08-29 | Discharge: 2015-09-05 | DRG: 871 | Disposition: A | Discharge status: Home Health | Payer: Medicare Other | Attending: Pulmonary Disease | Admitting: Pulmonary Disease

## 2015-08-29 DIAGNOSIS — Z8701 Personal history of pneumonia (recurrent): Secondary | ICD-10-CM

## 2015-08-29 DIAGNOSIS — J441 Chronic obstructive pulmonary disease with (acute) exacerbation: Secondary | ICD-10-CM | POA: Diagnosis present

## 2015-08-29 DIAGNOSIS — Z8249 Family history of ischemic heart disease and other diseases of the circulatory system: Secondary | ICD-10-CM

## 2015-08-29 DIAGNOSIS — Z79899 Other long term (current) drug therapy: Secondary | ICD-10-CM

## 2015-08-29 DIAGNOSIS — J189 Pneumonia, unspecified organism: Secondary | ICD-10-CM | POA: Diagnosis present

## 2015-08-29 DIAGNOSIS — R0902 Hypoxemia: Secondary | ICD-10-CM

## 2015-08-29 DIAGNOSIS — A310 Pulmonary mycobacterial infection: Secondary | ICD-10-CM | POA: Diagnosis present

## 2015-08-29 DIAGNOSIS — R0602 Shortness of breath: Secondary | ICD-10-CM | POA: Diagnosis present

## 2015-08-29 DIAGNOSIS — J9621 Acute and chronic respiratory failure with hypoxia: Secondary | ICD-10-CM | POA: Diagnosis present

## 2015-08-29 DIAGNOSIS — I2510 Atherosclerotic heart disease of native coronary artery without angina pectoris: Secondary | ICD-10-CM | POA: Diagnosis present

## 2015-08-29 DIAGNOSIS — Z7951 Long term (current) use of inhaled steroids: Secondary | ICD-10-CM | POA: Diagnosis not present

## 2015-08-29 DIAGNOSIS — E7800 Pure hypercholesterolemia, unspecified: Secondary | ICD-10-CM | POA: Diagnosis present

## 2015-08-29 DIAGNOSIS — J440 Chronic obstructive pulmonary disease with acute lower respiratory infection: Secondary | ICD-10-CM | POA: Diagnosis present

## 2015-08-29 DIAGNOSIS — Z79891 Long term (current) use of opiate analgesic: Secondary | ICD-10-CM | POA: Diagnosis not present

## 2015-08-29 DIAGNOSIS — Y95 Nosocomial condition: Secondary | ICD-10-CM | POA: Diagnosis present

## 2015-08-29 DIAGNOSIS — Z794 Long term (current) use of insulin: Secondary | ICD-10-CM | POA: Diagnosis not present

## 2015-08-29 DIAGNOSIS — I482 Chronic atrial fibrillation, unspecified: Secondary | ICD-10-CM | POA: Diagnosis present

## 2015-08-29 DIAGNOSIS — J984 Other disorders of lung: Secondary | ICD-10-CM | POA: Diagnosis not present

## 2015-08-29 DIAGNOSIS — K219 Gastro-esophageal reflux disease without esophagitis: Secondary | ICD-10-CM | POA: Diagnosis present

## 2015-08-29 DIAGNOSIS — J449 Chronic obstructive pulmonary disease, unspecified: Secondary | ICD-10-CM

## 2015-08-29 DIAGNOSIS — Z8614 Personal history of Methicillin resistant Staphylococcus aureus infection: Secondary | ICD-10-CM | POA: Diagnosis not present

## 2015-08-29 DIAGNOSIS — J8410 Pulmonary fibrosis, unspecified: Secondary | ICD-10-CM | POA: Diagnosis present

## 2015-08-29 DIAGNOSIS — Z7901 Long term (current) use of anticoagulants: Secondary | ICD-10-CM

## 2015-08-29 DIAGNOSIS — F17200 Nicotine dependence, unspecified, uncomplicated: Secondary | ICD-10-CM | POA: Diagnosis present

## 2015-08-29 DIAGNOSIS — E119 Type 2 diabetes mellitus without complications: Secondary | ICD-10-CM | POA: Diagnosis present

## 2015-08-29 DIAGNOSIS — Z833 Family history of diabetes mellitus: Secondary | ICD-10-CM | POA: Diagnosis not present

## 2015-08-29 DIAGNOSIS — Z87891 Personal history of nicotine dependence: Secondary | ICD-10-CM | POA: Diagnosis not present

## 2015-08-29 DIAGNOSIS — A419 Sepsis, unspecified organism: Principal | ICD-10-CM | POA: Diagnosis present

## 2015-08-29 LAB — COMPREHENSIVE METABOLIC PANEL
ALBUMIN: 2.4 g/dL — AB (ref 3.5–5.0)
ALT: 38 U/L (ref 17–63)
ANION GAP: 7 (ref 5–15)
AST: 23 U/L (ref 15–41)
Alkaline Phosphatase: 66 U/L (ref 38–126)
BUN: 34 mg/dL — ABNORMAL HIGH (ref 6–20)
CO2: 33 mmol/L — AB (ref 22–32)
Calcium: 8 mg/dL — ABNORMAL LOW (ref 8.9–10.3)
Chloride: 97 mmol/L — ABNORMAL LOW (ref 101–111)
Creatinine, Ser: 1.75 mg/dL — ABNORMAL HIGH (ref 0.61–1.24)
GFR calc Af Amer: 48 mL/min — ABNORMAL LOW (ref >60)
GFR calc non Af Amer: 42 mL/min — ABNORMAL LOW (ref >60)
GLUCOSE: 204 mg/dL — AB (ref 65–99)
POTASSIUM: 4.1 mmol/L (ref 3.5–5.1)
SODIUM: 137 mmol/L (ref 135–145)
TOTAL PROTEIN: 5.6 g/dL — AB (ref 6.5–8.1)
Total Bilirubin: 0.5 mg/dL (ref 0.3–1.2)

## 2015-08-29 LAB — I-STAT CG4 LACTIC ACID, ED
LACTIC ACID, VENOUS: 0.86 mmol/L (ref 0.5–2.0)
Lactic Acid, Venous: 2.63 mmol/L (ref 0.5–2.0)

## 2015-08-29 LAB — CBC WITH DIFFERENTIAL/PLATELET
BASOS PCT: 0 %
Basophils Absolute: 0 10*3/uL (ref 0.0–0.1)
EOS ABS: 0.1 10*3/uL (ref 0.0–0.7)
Eosinophils Relative: 0 %
HCT: 26.3 % — ABNORMAL LOW (ref 39.0–52.0)
Hemoglobin: 8.2 g/dL — ABNORMAL LOW (ref 13.0–17.0)
Lymphocytes Relative: 12 %
Lymphs Abs: 1.8 10*3/uL (ref 0.7–4.0)
MCH: 24.2 pg — ABNORMAL LOW (ref 26.0–34.0)
MCHC: 31.2 g/dL (ref 30.0–36.0)
MCV: 77.6 fL — ABNORMAL LOW (ref 78.0–100.0)
MONO ABS: 1.8 10*3/uL — AB (ref 0.1–1.0)
MONOS PCT: 11 %
NEUTROS PCT: 76 %
Neutro Abs: 11.9 10*3/uL — ABNORMAL HIGH (ref 1.7–7.7)
PLATELETS: 228 10*3/uL (ref 150–400)
RBC: 3.39 MIL/uL — ABNORMAL LOW (ref 4.22–5.81)
RDW: 23.1 % — AB (ref 11.5–15.5)
WBC: 15.6 10*3/uL — ABNORMAL HIGH (ref 4.0–10.5)

## 2015-08-29 LAB — GLUCOSE, CAPILLARY: Glucose-Capillary: 184 mg/dL — ABNORMAL HIGH (ref 65–99)

## 2015-08-29 LAB — INFLUENZA PANEL BY PCR (TYPE A & B)
H1N1FLUPCR: NOT DETECTED
Influenza A By PCR: NEGATIVE
Influenza B By PCR: NEGATIVE

## 2015-08-29 MED ORDER — CETYLPYRIDINIUM CHLORIDE 0.05 % MT LIQD
7.0000 mL | Freq: Two times a day (BID) | OROMUCOSAL | Status: DC
  Administered 2015-08-29 – 2015-09-05 (×14): 7 mL via OROMUCOSAL

## 2015-08-29 MED ORDER — DEXTROSE 5 % IV SOLN
INTRAVENOUS | Status: AC
  Filled 2015-08-29: qty 500

## 2015-08-29 MED ORDER — METFORMIN HCL 500 MG PO TABS
500.0000 mg | ORAL_TABLET | Freq: Every day | ORAL | Status: DC
  Administered 2015-08-30 – 2015-09-05 (×7): 500 mg via ORAL
  Filled 2015-08-29 (×7): qty 1

## 2015-08-29 MED ORDER — LEVALBUTEROL HCL 0.63 MG/3ML IN NEBU: 0.6300 mg | INHALATION_SOLUTION | Freq: Three times a day (TID) | RESPIRATORY_TRACT | Status: DC | PRN

## 2015-08-29 MED ORDER — RIFAMPIN 300 MG PO CAPS
600.0000 mg | ORAL_CAPSULE | Freq: Every day | ORAL | Status: DC
  Administered 2015-08-29 – 2015-09-05 (×8): 600 mg via ORAL
  Filled 2015-08-29 (×10): qty 2

## 2015-08-29 MED ORDER — INSULIN ASPART 100 UNIT/ML ~~LOC~~ SOLN: 0.0000 [IU] | Freq: Every day | SUBCUTANEOUS | Status: DC

## 2015-08-29 MED ORDER — ETHAMBUTOL HCL 400 MG PO TABS
ORAL_TABLET | ORAL | Status: AC
  Filled 2015-08-29: qty 3

## 2015-08-29 MED ORDER — TIOTROPIUM BROMIDE MONOHYDRATE 2.5 MCG/ACT IN AERS: 1.0000 | INHALATION_SPRAY | Freq: Every day | RESPIRATORY_TRACT | Status: DC

## 2015-08-29 MED ORDER — ENOXAPARIN SODIUM 40 MG/0.4ML ~~LOC~~ SOLN
40.0000 mg | SUBCUTANEOUS | Status: DC
  Administered 2015-08-29 – 2015-09-04 (×7): 40 mg via SUBCUTANEOUS
  Filled 2015-08-29 (×7): qty 0.4

## 2015-08-29 MED ORDER — NICOTINE 14 MG/24HR TD PT24
14.0000 mg | MEDICATED_PATCH | Freq: Every day | TRANSDERMAL | Status: DC
  Filled 2015-08-29 (×2): qty 1

## 2015-08-29 MED ORDER — HYDROCODONE-ACETAMINOPHEN 5-325 MG PO TABS
1.0000 | ORAL_TABLET | Freq: Four times a day (QID) | ORAL | Status: DC | PRN
  Administered 2015-08-29 – 2015-08-31 (×5): 1 via ORAL
  Filled 2015-08-29 (×5): qty 1

## 2015-08-29 MED ORDER — SODIUM CHLORIDE 0.9 % IV SOLN
1250.0000 mg | Freq: Once | INTRAVENOUS | Status: AC
  Administered 2015-08-29: 1250 mg via INTRAVENOUS
  Filled 2015-08-29: qty 1250

## 2015-08-29 MED ORDER — LORAZEPAM 0.5 MG PO TABS
0.5000 mg | ORAL_TABLET | Freq: Four times a day (QID) | ORAL | Status: DC | PRN
  Administered 2015-08-31 – 2015-09-05 (×3): 0.5 mg via ORAL
  Filled 2015-08-29 (×3): qty 1

## 2015-08-29 MED ORDER — CEFEPIME HCL 1 G IJ SOLR
1.0000 g | Freq: Once | INTRAMUSCULAR | Status: AC
  Administered 2015-08-29: 1 g via INTRAVENOUS
  Filled 2015-08-29: qty 1

## 2015-08-29 MED ORDER — INSULIN ASPART 100 UNIT/ML ~~LOC~~ SOLN
0.0000 [IU] | Freq: Three times a day (TID) | SUBCUTANEOUS | Status: DC
  Administered 2015-08-31: 7 [IU] via SUBCUTANEOUS
  Administered 2015-09-01: 3 [IU] via SUBCUTANEOUS
  Administered 2015-09-01 – 2015-09-02 (×2): 4 [IU] via SUBCUTANEOUS
  Administered 2015-09-02 – 2015-09-05 (×6): 3 [IU] via SUBCUTANEOUS

## 2015-08-29 MED ORDER — SODIUM CHLORIDE 0.9 % IV BOLUS (SEPSIS)
250.0000 mL | Freq: Once | INTRAVENOUS | Status: AC
  Administered 2015-08-29: 250 mL via INTRAVENOUS

## 2015-08-29 MED ORDER — RIFAMPIN 300 MG PO CAPS
ORAL_CAPSULE | ORAL | Status: AC
  Filled 2015-08-29: qty 2

## 2015-08-29 MED ORDER — NICOTINE 21 MG/24HR TD PT24
21.0000 mg | MEDICATED_PATCH | Freq: Every day | TRANSDERMAL | Status: DC
  Administered 2015-08-29 – 2015-09-05 (×8): 21 mg via TRANSDERMAL
  Filled 2015-08-29 (×8): qty 1

## 2015-08-29 MED ORDER — PANTOPRAZOLE SODIUM 40 MG PO TBEC
40.0000 mg | DELAYED_RELEASE_TABLET | Freq: Two times a day (BID) | ORAL | Status: DC
  Administered 2015-08-29 – 2015-09-05 (×14): 40 mg via ORAL
  Filled 2015-08-29 (×14): qty 1

## 2015-08-29 MED ORDER — DEXTROSE 5 % IV SOLN
500.0000 mg | INTRAVENOUS | Status: DC
  Administered 2015-08-29 – 2015-09-04 (×7): 500 mg via INTRAVENOUS
  Filled 2015-08-29 (×7): qty 500

## 2015-08-29 MED ORDER — ACETAMINOPHEN 325 MG PO TABS
650.0000 mg | ORAL_TABLET | Freq: Once | ORAL | Status: AC
Start: 2015-08-29 — End: 2015-08-29
  Administered 2015-08-29: 650 mg via ORAL
  Filled 2015-08-29: qty 2

## 2015-08-29 MED ORDER — INSULIN ASPART 100 UNIT/ML ~~LOC~~ SOLN
3.0000 [IU] | Freq: Three times a day (TID) | SUBCUTANEOUS | Status: DC
  Administered 2015-08-30 – 2015-09-05 (×19): 3 [IU] via SUBCUTANEOUS

## 2015-08-29 MED ORDER — SODIUM CHLORIDE 0.9 % IV SOLN
INTRAVENOUS | Status: DC
  Administered 2015-08-29 – 2015-09-02 (×4): via INTRAVENOUS

## 2015-08-29 MED ORDER — ETHAMBUTOL HCL 400 MG PO TABS
15.0000 mg/kg | ORAL_TABLET | Freq: Every day | ORAL | Status: DC
  Administered 2015-08-29: 1000 mg via ORAL
  Filled 2015-08-29 (×3): qty 2

## 2015-08-29 MED ORDER — VANCOMYCIN HCL IN DEXTROSE 750-5 MG/150ML-% IV SOLN
750.0000 mg | Freq: Two times a day (BID) | INTRAVENOUS | Status: DC
  Administered 2015-08-30 – 2015-09-05 (×14): 750 mg via INTRAVENOUS
  Filled 2015-08-29 (×16): qty 150

## 2015-08-29 MED ORDER — DILTIAZEM HCL ER COATED BEADS 120 MG PO CP24
120.0000 mg | ORAL_CAPSULE | Freq: Every day | ORAL | Status: DC
  Administered 2015-08-29 – 2015-09-05 (×8): 120 mg via ORAL
  Filled 2015-08-29 (×8): qty 1

## 2015-08-29 NOTE — Progress Notes (Signed)
Pharmacy Antibiotic Note  Ryan [name] is a 55 y.o. male admitted on 08/29/2015 with pneumonia.  Pharmacy has been consulted for vancomycin dosing. He just completed a course of vancomycin last week for MRSA bacteremia and had a recent admission for PNA  Plan: Vancomycin 1250 mg IV x 1 then 750 mg IV q12 hours   Height: '5\' 8"'$  (172.7 cm) Weight: 144 lb (65.318 kg) IBW/kg (Calculated) : 68.4  Temp (24hrs), Avg:100.3 F (37.9 C), Min:99 F (37.2 C), Max:101.6 F (38.7 C)   Recent Labs Lab 08/23/15 0608 08/24/15 0649 08/25/15 0625 08/29/15 1025  WBC 9.2 12.1* 13.2* 15.6*  CREATININE 1.05 1.10 1.12 1.75*    Estimated Creatinine Clearance: 43.5 mL/min (by C-G formula based on Cr of 1.75).    Allergies  Allergen Reactions  . Albuterol Palpitations  . Influenza Vaccine Live Swelling    Thank you for allowing pharmacy to be a part of this patient's care.  Excell Seltzer Poteet 08/29/2015 11:54 AM

## 2015-08-29 NOTE — ED Notes (Signed)
MD notified of pt temp 101.6 rectally and hr 132.

## 2015-08-29 NOTE — H&P (Signed)
[facility] History and Physical  [address] XNA:[id number] DOB: 1960-10-05    PCP:   Alonza Bogus, MD   Chief Complaint:  Hypoxia, fever, chills, and coughs.   HPI: Ryan [name] is an 55 y.o. male with complex medical history, including recent admission for sepsis, Tx with 9 days of Cefepime and Vancomycin, with intention is to continue with IV Lucianne Lei via PICC line for another 4 weeks for MRSA bactermia, hx of cavitary lesion on his left upper lobe, s/p biopsy showing inflammation, not cancerous, and positive for MAI at that time at least  2 years ago.  He also had another smaller lung lesion being followed.  He was discharged, and his wife brought him back as he was not feeling well, became more hypoxic, and SOB.  Work up in the ER showed a CT of the lung without contrast, confirming the LUL cavitary lesion, but it has gotten much larger than previous, and having both partially cavitated and partially solid.  His leukkocytosis with WBC of 15K, and Cr of 1.75.  His oxygen saturation was in the 50 percent, and he corrected with HFNC.  Hospitalist was asked to admit him for further evaluation and Tx.  He admitted to having weight loss and night sweat.  He was incarcerated for 3 years many years ago.  Rewiew of Systems:  Constitutional: Negative for malaise, fever and chills. No significant weight loss or weight gain Eyes: Negative for eye pain, redness and discharge, diplopia, visual changes, or flashes of light. ENMT: Negative for ear pain, hoarseness, nasal congestion, sinus pressure and sore throat. No headaches; tinnitus, drooling, or problem swallowing. Cardiovascular: Negative for chest pain, palpitations,  dyspnea and peripheral edema. ; No orthopnea, PND Respiratory: Negative for cough, hemoptysis, wheezing and stridor. No pleuritic chestpain. Gastrointestinal: Negative for nausea, vomiting, diarrhea, constipation, abdominal pain, melena, blood in stool, hematemesis, jaundice and  rectal bleeding.    Genitourinary: Negative for frequency, dysuria, incontinence,flank pain and hematuria; Musculoskeletal: Negative for back pain and neck pain. Negative for swelling and trauma.;  Skin: . Negative for pruritus, rash, abrasions, bruising and skin lesion.; ulcerations Neuro: Negative for headache, lightheadedness and neck stiffness. Negative for weakness, altered level of consciousness , altered mental status, extremity weakness, burning feet, involuntary movement, seizure and syncope.  Psych: negative for anxiety, depression, insomnia, tearfulness, panic attacks, hallucinations, paranoia, suicidal or homicidal ideation    Past Medical History  Diagnosis Date  . COPD (chronic obstructive pulmonary disease) ([place])   . Hypercholesteremia   . History of pneumonia   . Polycythemia   . Coronary atherosclerosis of native coronary artery     Mild nonobstructive disease at catheterization 2007  . 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 Borderline diabetes   . Seizures ([place])     Last seizure 2 yrs ago  . GERD (gastroesophageal reflux disease)   . Type 2 diabetes mellitus ([place])   . DDD (degenerative disc disease)     Cervical and thoracic  . Chronic back pain   . Bronchitis   . Chronic left shoulder pain   . Chronic neck pain   . Atrial fibrillation ([place])     Not anticoagulated  . Smoker   . Type 2 diabetes mellitus without complication ([place])   . CAP (community acquired pneumonia) 07/10/2013    04/2015  . Collagen vascular disease ([place])   . Chronic respiratory failure ([place])   . Barrett's esophagus  Past Surgical History  Procedure Laterality Date  . Colonoscopy  2012    Dr. Posey Pronto: normal  . Vasectomy    . Throat biopsy    . Lung biopsy    . Vasectomy  1987  . Esophagogastroduodenoscopy N/A 05/04/2015    Dr. Michail Sermon: minimal erosive esophagitis, small hiatal hernia. FOOD PRECLUDED A COMPLETE EXAM. No biopsies taken  .  Esophagogastroduodenoscopy  2011    [place]: Barrett's     Medications:  HOME MEDS: Prior to Admission medications   Medication Sig Start Date End Date Taking? Authorizing Provider  albuterol (VENTOLIN HFA) 108 (90 BASE) MCG/ACT inhaler Inhale 2 puffs into the lungs every 6 (six) hours as needed for wheezing or shortness of breath.   Yes Historical Provider, MD  baclofen (LIORESAL) 10 MG tablet Take 10 mg by mouth 3 (three) times daily.  06/05/15  Yes Historical Provider, MD  benzonatate (TESSALON) 200 MG capsule Take 200 mg by mouth 3 (three) times daily.   Yes Historical Provider, MD  Dextromethorphan-Guaifenesin 20-400 MG TABS Take 1 tablet by mouth every morning.    Yes Historical Provider, MD  diltiazem (CARDIZEM CD) 120 MG 24 hr capsule Take 1 capsule (120 mg total) by mouth daily. 05/11/15  Yes Lendon Colonel, NP  diphenhydrAMINE (BENADRYL) 25 MG tablet Take 50 mg by mouth every morning.   Yes Historical Provider, MD  fluticasone (FLONASE) 50 MCG/ACT nasal spray Place 2 sprays into both nostrils daily as needed for allergies.    Yes Historical Provider, MD  furosemide (LASIX) 40 MG tablet Take 40 mg by mouth 2 (two) times daily.   Yes Historical Provider, MD  insulin glargine (LANTUS) 100 UNIT/ML injection Inject 0.35 mLs (35 Units total) into the skin daily. 08/25/15  Yes Lucia Gaskins, MD  insulin lispro (HUMALOG) 100 UNIT/ML injection Inject into the skin 3 (three) times daily before meals. Per sliding scale   Yes Historical Provider, MD  levalbuterol (XOPENEX) 0.63 MG/3ML nebulizer solution Take 3 mLs (0.63 mg total) by nebulization 3 (three) times daily. Patient taking differently: Take 0.63 mg by nebulization every 8 (eight) hours as needed for wheezing or shortness of breath.  09/15/14  Yes Rexene Alberts, MD  LORazepam (ATIVAN) 0.5 MG tablet Take 1 tablet (0.5 mg total) by mouth 2 (two) times daily as needed for anxiety or sleep. Patient taking differently: Take 0.5 mg by  mouth 4 (four) times daily as needed for anxiety or sleep.  05/04/15  Yes Maryann Mikhail, DO  Magnesium 500 MG TABS Take 500 mg by mouth daily.   Yes Historical Provider, MD  metFORMIN (GLUCOPHAGE) 500 MG tablet Take 1 tablet (500 mg total) by mouth daily with breakfast. 09/15/14  Yes Rexene Alberts, MD  mometasone-formoterol (DULERA) 100-5 MCG/ACT AERO Inhale 2 puffs into the lungs 2 (two) times daily.   Yes Historical Provider, MD  montelukast (SINGULAIR) 10 MG tablet Take 10 mg by mouth at bedtime.   Yes Historical Provider, MD  nicotine (NICODERM CQ - DOSED IN MG/24 HOURS) 14 mg/24hr patch Place 14 mg onto the skin daily.   Yes Historical Provider, MD  nitroGLYCERIN (NITROSTAT) 0.4 MG SL tablet Place 1 tablet (0.4 mg total) under the tongue every 5 (five) minutes as needed for chest pain. 04/22/13  Yes Lendon Colonel, NP  oxyCODONE-acetaminophen (PERCOCET) 10-325 MG tablet Take 1 tablet by mouth every 4 (four) hours as needed for pain.   Yes Historical Provider, MD  pantoprazole (PROTONIX) 40 MG tablet Take 1 tablet (  40 mg total) by mouth 2 (two) times daily. 05/05/15  Yes Maryann Mikhail, DO  potassium chloride SA (K-DUR,KLOR-CON) 20 MEQ tablet Take 20 mEq by mouth 2 (two) times daily.   Yes Historical Provider, MD  SPIRIVA RESPIMAT 2.5 MCG/ACT AERS Inhale 1 puff into the lungs daily. 06/20/15  Yes Historical Provider, MD  varenicline (CHANTIX) 0.5 MG tablet Take 1 tablet (0.5 mg total) by mouth 2 (two) times daily. 08/26/15  Yes Lucia Gaskins, MD  edoxaban (SAVAYSA) 60 MG TABS tablet Take 60 mg by mouth daily. Patient not taking: Reported on 08/29/2015 05/11/15   Lendon Colonel, NP  insulin aspart (NOVOLOG) 100 UNIT/ML injection Inject 0-20 Units into the skin 3 (three) times daily with meals. Patient not taking: Reported on 08/29/2015 08/01/15   [name], MD  nicotine (NICODERM CQ - DOSED IN MG/24 HOURS) 21 mg/24hr patch Place 1 patch (21 mg total) onto the skin daily. Patient not  taking: Reported on 08/17/2015 05/05/15   Cristal Ford, DO  PHARMACIST CHOICE LANCETS MISC USE 2 TIMES DAILY FOR DIABETIC TESTING Patient not taking: Reported on 08/29/2015 08/08/15   Orlena Sheldon, PA-C  predniSONE (DELTASONE) 10 MG tablet Take 4 tablets (40 mg total) by mouth daily with breakfast. Patient not taking: Reported on 08/17/2015 08/01/15   [name], MD     Allergies:  Allergies  Allergen Reactions  . Albuterol Palpitations  . Influenza Vaccine Live Swelling    Social History:   reports that he quit smoking about 3 months ago. His smoking use included Cigarettes. He has a 45 pack-year smoking history. He has never used smokeless tobacco. He reports that he does not drink alcohol or use illicit drugs.  Family History: Family History  Problem Relation Age of Onset  . Hypertension Mother   . Diabetes Mother   . Heart attack Mother   . Heart failure Mother   . Hypertension Sister   . Diabetes Sister   . Heart failure Sister   . Colon cancer Neg Hx      Physical Exam: Filed Vitals:   08/29/15 1100 08/29/15 1145 08/29/15 1200 08/29/15 1345  BP: 105/71  129/82   Pulse: 95  106 93  Temp:  101.6 F (38.7 C)    TempSrc:  Rectal    Resp: '30  28 25  '$ Height:      Weight:      SpO2: 98%  97% 97%   Blood pressure 129/82, pulse 93, temperature 101.6 F (38.7 C), temperature source Rectal, resp. rate 25, height '5\' 8"'$  (1.727 m), weight 65.318 kg (144 lb), SpO2 97 %.  GEN:  Pleasant patient lying in the stretcher in no acute distress; cooperative with exam. PSYCH:  alert and oriented x4; does not appear anxious or depressed; affect is appropriate. HEENT: Mucous membranes pink and anicteric; PERRLA; EOM intact; no cervical lymphadenopathy nor thyromegaly or carotid bruit; no JVD; There were no stridor. Neck is very supple. Breasts:: Not examined CHEST WALL: No tenderness CHEST: Normal respiration, clear to auscultation bilaterally.  HEART: Regular rate and rhythm.   There are no murmur, rub, or gallops.   BACK: No kyphosis or scoliosis; no CVA tenderness ABDOMEN: soft and non-tender; no masses, no organomegaly, normal abdominal bowel sounds; no pannus; no intertriginous candida. There is no rebound and no distention. Rectal Exam: Not done EXTREMITIES: No bone or joint deformity; age-appropriate arthropathy of the hands and knees; no edema; no ulcerations.  There is no calf tenderness. Genitalia: not examined  PULSES: 2+ and symmetric SKIN: Normal hydration no rash or ulceration CNS: Cranial nerves 2-12 grossly intact no focal lateralizing neurologic deficit.  Speech is fluent; uvula elevated with phonation, facial symmetry and tongue midline. DTR are normal bilaterally, cerebella exam is intact, barbinski is negative and strengths are equaled bilaterally.  No sensory loss.   Labs on Admission:  Basic Metabolic Panel:  Recent Labs Lab 08/23/15 0608 08/24/15 0649 08/24/15 1200 08/25/15 0625 08/29/15 1025  NA 139 140  --  137 137  K 4.0 4.3  --  5.0 4.1  CL 97* 97*  --  95* 97*  CO2 35* 33*  --  33* 33*  GLUCOSE 304* 232* 572* 396* 204*  BUN 36* 37*  --  39* 34*  CREATININE 1.05 1.10  --  1.12 1.75*  CALCIUM 8.1* 8.3*  --  8.4* 8.0*   Liver Function Tests:  Recent Labs Lab 08/23/15 0939 08/29/15 1025  AST 32 23  ALT 117* 38  ALKPHOS 76 66  BILITOT 0.5 0.5  PROT 5.9* 5.6*  ALBUMIN 2.5* 2.4*   CBC:  Recent Labs Lab 08/23/15 0608 08/24/15 0649 08/25/15 0625 08/29/15 1025  WBC 9.2 12.1* 13.2* 15.6*  NEUTROABS 7.4 10.2* 10.9* 11.9*  HGB 8.4* 8.9* 8.6* 8.2*  HCT 27.3* 29.2* 27.4* 26.3*  MCV 76.7* 77.0* 77.4* 77.6*  PLT 314 313 322 228   Cardiac Enzymes:  CBG:  Recent Labs Lab 08/24/15 1340 08/24/15 1712 08/24/15 2153 08/25/15 0802 08/25/15 1134  GLUCAP 373* 188* 174* 321* 271*     Radiological Exams on Admission: Dg Chest 2 View  08/29/2015  CLINICAL DATA:  Hypoxia and fever EXAM: CHEST  2 VIEW COMPARISON:   08/17/2015, 05/01/2015 FINDINGS: Cardiac shadow is stable. Persistent changes are noted in the right lung base when compared with the prior exam. No sizable effusion is seen. Left lung demonstrates evidence of a cavitary appearing lesion in the left upper lobe. This was not as well appreciated on the recent exam due to overlying external artifact. Increased density is noted along the inferior aspect of the cavity which may represent some more acute wall thickening and inflammatory change. Emphysematous changes are again seen. IMPRESSION: Stable changes in the right base. More apparent cavitary lesion in the left upper lobe when compared with the prior exam. This is in part due to overlying artifact although some wall thickening in the inferior aspect of the cavity is noted. CT of the chest is recommended for further evaluation as well as per prior recommendations. Although not mentioned in the body of the report, a left-sided PICC line is again seen and stable. Electronically Signed   By: Inez Catalina M.D.   On: 08/29/2015 10:59   Ct Chest Wo Contrast  08/29/2015  CLINICAL DATA:  Hypoxia and fever. Cough and shortness of breath. COPD. Enlarging left upper lobe cavitary lesions seen on recent chest radiograph. EXAM: CT CHEST WITHOUT CONTRAST TECHNIQUE: Multidetector CT imaging of the chest was performed following the standard protocol without IV contrast. COMPARISON:  Chest radiograph on 08/29/2015 and CT on 05/01/2015 FINDINGS: Mediastinum/Lymph Nodes: No masses or pathologically enlarged lymph nodes identified on this un-enhanced exam. Tiny hiatal hernia again noted. Three-vessel coronary artery calcification again demonstrated. Lungs/Pleura: Severe emphysema again demonstrated with upper lobe predominance. Peripheral honeycombing is seen in the posterior lower lobes bilaterally which is stable and consistent with chronic interstitial fibrosis. A new tiny right pleural effusion is seen. No evidence of left  pleural effusion. A partially solid  and partially cavitary lesion is seen in the left upper lobe which has increased in size since previous study. This currently measures 3.7 x 5.4 cm on image 19/series 3 compared to 2.1 x 3.2 cm previously. This is suspicious for enlarging cavitary bronchogenic carcinoma, although differential diagnosis still includes a cavitary infectious or inflammatory process. Upper abdomen: No acute findings. Musculoskeletal: No chest wall mass or suspicious bone lesions identified. IMPRESSION: Enlarging mixed solid and cavitary lesion in the left upper lobe. 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 Consider bronchoscopy or percutaneous needle biopsy. Severe bullous emphysema and chronic bibasilar predominant interstitial fibrosis. Increased tiny right pleural effusion. No definite evidence of thoracic lymphadenopathy. Electronically Signed   By: Earle Gell M.D.   On: 08/29/2015 12:50   Assessment/Plan Present on Admission:  . MAI (mycobacterium avium-intracellulare) ([place]) . TOBACCO ABUSE . Pulmonary fibrosis ([place]) . Chronic atrial fibrillation ([place]) . SOB (shortness of breath)  PLAN:  Respiratory failure:  I am concerned that he may have NTM (non TB Mycoplasma) infection.  He had hx of MAI in the past, and the lesion has grown after Prednisone.  He has been tx with antibiotics Van/Cefepime for bacteremia, and hadn't gotten better.  He could have TB, but less likely.  I also can't r/out malignancy, as he certain has increase risk for lung CA, and the lesion has grown in size.  He will need bronchoscopy for biopsy.  Will start him on IV Zithromax, Rifampin and Ethambutol at '15mg'$ /kg daily until we can obtain diagnosis.  We place him on airborn precaution.  I will continue with IV Lucianne Lei to finish his MRSA bacteremia Tx.  Will obtain culture and Quanterferon Gold.  He  is fortunately hemodynamically stable, and will be admitted to Dr Luan Pulling s service.  Thank you and Good Day.   Other plans as per orders. Code Status: FULL Haskel Khan, MD. FACP [facility] Pager [phone] 7pm to 7am.  08/29/2015, 2:45 PM

## 2015-08-29 NOTE — ED Notes (Signed)
Dr Le at bedside.  

## 2015-08-29 NOTE — Progress Notes (Signed)
Patient complaining of pain in his back side and shoulder.  Rated it as a 10 out of 10.  No pain meds ordered.  MD notified.  Waiting on a response.

## 2015-08-29 NOTE — ED Notes (Signed)
Called to room by patient's wife because pt had chills.  Pt afebrile orally but checked rectal temp and pt had fever.  Pt's primary RN aware and will notify edp.

## 2015-08-29 NOTE — ED Provider Notes (Signed)
CSN: [id number]     Arrival date & time 08/29/15  0910 History  By signing my name below, I, Ryan [name], attest that this documentation has been prepared under the direction and in the presence of Ryan Sorrow, MD. Electronically Signed: Eustaquio [name], ED Scribe. 08/29/2015. 9:55 AM.   Chief Complaint  Patient presents with  . Cough   Patient is a 55 y.o. male presenting with cough. The history is provided by the patient. No language interpreter was used.  Cough Cough characteristics:  Productive Sputum characteristics:  Yellow Severity:  Moderate Onset quality:  Gradual Duration:  1 day Timing:  Constant Progression:  Unchanged Chronicity:  Recurrent Smoker: former smoker.   Associated symptoms: chest pain, chills, fever (subjective), headaches, myalgias, rash (on abdomen) and shortness of breath   Associated symptoms: no rhinorrhea and no sore throat      HPI Comments: Ryan [name] is a 55 y.o. male brought by ambulance, with hx COPD who presents to the Emergency Department complaining of gradual onset, constant, shortness of breath and low oxygen levels that began earlier this morning. Wife reports that she came home from dropping her children off at school and checked pt's O2 level which was 55%. She states that she increased pt's 2L O2 at home "all the way up" without relief, prompting her to call EMS. Pt was recently admitted to the hospital for pneumonia on 08/17/2015 and discharged 5 days ago. Per chart review, pt was told by provider to increase his home O2 to 7%.Wife states that they were told to place pt on 4% home O2. She mentions that pt was doing well since being discharged but began having chills and a subjective fever last night. Pt also reports a rash on his abdomen. Denies any other associated symptoms.   Past Medical History  Diagnosis Date  . COPD (chronic obstructive pulmonary disease) ([place])   . Hypercholesteremia   . History of pneumonia   . Polycythemia    . Coronary atherosclerosis of native coronary artery     Mild nonobstructive disease at catheterization 2007  . 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 Borderline diabetes   . Seizures ([place])     Last seizure 2 yrs ago  . GERD (gastroesophageal reflux disease)   . Type 2 diabetes mellitus ([place])   . DDD (degenerative disc disease)     Cervical and thoracic  . Chronic back pain   . Bronchitis   . Chronic left shoulder pain   . Chronic neck pain   . Atrial fibrillation ([place])     Not anticoagulated  . Smoker   . Type 2 diabetes mellitus without complication ([place])   . CAP (community acquired pneumonia) 07/10/2013    04/2015  . Collagen vascular disease ([place])   . Chronic respiratory failure ([place])   . Barrett's esophagus    Past Surgical History  Procedure Laterality Date  . Colonoscopy  2012    Dr. Posey Pronto: normal  . Vasectomy    . Throat biopsy    . Lung biopsy    . Vasectomy  1987  . Esophagogastroduodenoscopy N/A 05/04/2015    Dr. Michail Sermon: minimal erosive esophagitis, small hiatal hernia. FOOD PRECLUDED A COMPLETE EXAM. No biopsies taken  . Esophagogastroduodenoscopy  2011    [place]: Barrett's    Family History  Problem Relation Age of Onset  . Hypertension Mother   . Diabetes Mother   . Heart attack Mother   .  Heart failure Mother   . Hypertension Sister   . Diabetes Sister   . Heart failure Sister   . Colon cancer Neg Hx    Social History  Substance Use Topics  . Smoking status: Former Smoker -- 1.00 packs/day for 45 years    Types: Cigarettes    Quit date: 05/01/2015  . Smokeless tobacco: Never Used  . Alcohol Use: No    Review of Systems  Constitutional: Positive for fever (subjective) and chills.  HENT: Negative for congestion, rhinorrhea and sore throat.   Eyes: Negative for visual disturbance.  Respiratory: Positive for cough and shortness of breath.   Cardiovascular: Positive for chest pain and leg swelling.   Gastrointestinal: Positive for abdominal pain. Negative for nausea, vomiting and diarrhea.  Genitourinary: Positive for dysuria. Negative for hematuria.  Musculoskeletal: Positive for myalgias. Negative for back pain and neck pain.  Skin: Positive for rash (on abdomen).  Neurological: Positive for headaches.  Hematological: Does not bruise/bleed easily.  Psychiatric/Behavioral: Negative for confusion.      Allergies  Albuterol and Influenza vaccine live  Home Medications   Prior to Admission medications   Medication Sig Start Date End Date Taking? Authorizing Provider  albuterol (VENTOLIN HFA) 108 (90 BASE) MCG/ACT inhaler Inhale 2 puffs into the lungs every 6 (six) hours as needed for wheezing or shortness of breath.   Yes Historical Provider, MD  baclofen (LIORESAL) 10 MG tablet Take 10 mg by mouth 3 (three) times daily.  06/05/15  Yes Historical Provider, MD  benzonatate (TESSALON) 200 MG capsule Take 200 mg by mouth 3 (three) times daily.   Yes Historical Provider, MD  Dextromethorphan-Guaifenesin 20-400 MG TABS Take 1 tablet by mouth every morning.    Yes Historical Provider, MD  diltiazem (CARDIZEM CD) 120 MG 24 hr capsule Take 1 capsule (120 mg total) by mouth daily. 05/11/15  Yes Lendon Colonel, NP  diphenhydrAMINE (BENADRYL) 25 MG tablet Take 50 mg by mouth every morning.   Yes Historical Provider, MD  fluticasone (FLONASE) 50 MCG/ACT nasal spray Place 2 sprays into both nostrils daily as needed for allergies.    Yes Historical Provider, MD  furosemide (LASIX) 40 MG tablet Take 40 mg by mouth 2 (two) times daily.   Yes Historical Provider, MD  insulin glargine (LANTUS) 100 UNIT/ML injection Inject 0.35 mLs (35 Units total) into the skin daily. 08/25/15  Yes Lucia Gaskins, MD  insulin lispro (HUMALOG) 100 UNIT/ML injection Inject into the skin 3 (three) times daily before meals. Per sliding scale   Yes Historical Provider, MD  levalbuterol (XOPENEX) 0.63 MG/3ML nebulizer  solution Take 3 mLs (0.63 mg total) by nebulization 3 (three) times daily. Patient taking differently: Take 0.63 mg by nebulization every 8 (eight) hours as needed for wheezing or shortness of breath.  09/15/14  Yes Rexene Alberts, MD  LORazepam (ATIVAN) 0.5 MG tablet Take 1 tablet (0.5 mg total) by mouth 2 (two) times daily as needed for anxiety or sleep. Patient taking differently: Take 0.5 mg by mouth 4 (four) times daily as needed for anxiety or sleep.  05/04/15  Yes Maryann Mikhail, DO  Magnesium 500 MG TABS Take 500 mg by mouth daily.   Yes Historical Provider, MD  metFORMIN (GLUCOPHAGE) 500 MG tablet Take 1 tablet (500 mg total) by mouth daily with breakfast. 09/15/14  Yes Rexene Alberts, MD  mometasone-formoterol (DULERA) 100-5 MCG/ACT AERO Inhale 2 puffs into the lungs 2 (two) times daily.   Yes Historical Provider, MD  montelukast (  SINGULAIR) 10 MG tablet Take 10 mg by mouth at bedtime.   Yes Historical Provider, MD  nicotine (NICODERM CQ - DOSED IN MG/24 HOURS) 14 mg/24hr patch Place 14 mg onto the skin daily.   Yes Historical Provider, MD  nitroGLYCERIN (NITROSTAT) 0.4 MG SL tablet Place 1 tablet (0.4 mg total) under the tongue every 5 (five) minutes as needed for chest pain. 04/22/13  Yes Lendon Colonel, NP  oxyCODONE-acetaminophen (PERCOCET) 10-325 MG tablet Take 1 tablet by mouth every 4 (four) hours as needed for pain.   Yes Historical Provider, MD  pantoprazole (PROTONIX) 40 MG tablet Take 1 tablet (40 mg total) by mouth 2 (two) times daily. 05/05/15  Yes Maryann Mikhail, DO  potassium chloride SA (K-DUR,KLOR-CON) 20 MEQ tablet Take 20 mEq by mouth 2 (two) times daily.   Yes Historical Provider, MD  SPIRIVA RESPIMAT 2.5 MCG/ACT AERS Inhale 1 puff into the lungs daily. 06/20/15  Yes Historical Provider, MD  varenicline (CHANTIX) 0.5 MG tablet Take 1 tablet (0.5 mg total) by mouth 2 (two) times daily. 08/26/15  Yes Lucia Gaskins, MD  edoxaban (SAVAYSA) 60 MG TABS tablet Take 60 mg by  mouth daily. Patient not taking: Reported on 08/29/2015 05/11/15   Lendon Colonel, NP  insulin aspart (NOVOLOG) 100 UNIT/ML injection Inject 0-20 Units into the skin 3 (three) times daily with meals. Patient not taking: Reported on 08/29/2015 08/01/15   [name], MD  nicotine (NICODERM CQ - DOSED IN MG/24 HOURS) 21 mg/24hr patch Place 1 patch (21 mg total) onto the skin daily. Patient not taking: Reported on 08/17/2015 05/05/15   Cristal Ford, DO  PHARMACIST CHOICE LANCETS MISC USE 2 TIMES DAILY FOR DIABETIC TESTING Patient not taking: Reported on 08/29/2015 08/08/15   Orlena Sheldon, PA-C  predniSONE (DELTASONE) 10 MG tablet Take 4 tablets (40 mg total) by mouth daily with breakfast. Patient not taking: Reported on 08/17/2015 08/01/15   [name], MD   BP 97/67 mmHg  Pulse 104  Temp(Src) 101.6 F (38.7 C) (Rectal)  Resp 24  Ht '5\' 8"'$  (1.727 m)  Wt 65.318 kg  BMI 21.90 kg/m2  SpO2 96%   Physical Exam  Constitutional: He is oriented to person, place, and time. He appears well-developed and well-nourished. No distress.  HENT:  Head: Normocephalic and atraumatic.  Mouth/Throat: Mucous membranes are normal.  Eyes: Conjunctivae and EOM are normal. Pupils are equal, round, and reactive to light. Right eye exhibits no discharge. Left eye exhibits no discharge.  Neck: Neck supple. No tracheal deviation present.  Cardiovascular: Regular rhythm.  Tachycardia present.   Pulmonary/Chest: Effort normal. No respiratory distress. He has no wheezes. He has no rales.  Decreased breath sounds  Abdominal: Soft. Bowel sounds are normal. He exhibits distension. There is no tenderness.  Musculoskeletal: Normal range of motion. He exhibits edema.  Bilateral 2+ pitting edema  Neurological: He is alert and oriented to person, place, and time.  Skin: Skin is warm and dry.  Psychiatric: He has a normal mood and affect. His behavior is normal.  Nursing note and vitals reviewed.   ED Course   Procedures (including critical care time)  DIAGNOSTIC STUDIES: Oxygen Saturation is 94% on 7L [place], low by my interpretation.    COORDINATION OF CARE: 9:53 AM-Discussed treatment plan with pt at bedside and pt agreed to plan.   Labs Review Labs Reviewed  CBC WITH DIFFERENTIAL/PLATELET - Abnormal; Notable for the following:    WBC 15.6 (*)    RBC  3.39 (*)    Hemoglobin 8.2 (*)    HCT 26.3 (*)    MCV 77.6 (*)    MCH 24.2 (*)    RDW 23.1 (*)    Neutro Abs 11.9 (*)    Monocytes Absolute 1.8 (*)    All other components within normal limits  COMPREHENSIVE METABOLIC PANEL - Abnormal; Notable for the following:    Chloride 97 (*)    CO2 33 (*)    Glucose, Bld 204 (*)    BUN 34 (*)    Creatinine, Ser 1.75 (*)    Calcium 8.0 (*)    Total Protein 5.6 (*)    Albumin 2.4 (*)    GFR calc non Af Amer 42 (*)    GFR calc Af Amer 48 (*)    All other components within normal limits  I-STAT CG4 LACTIC ACID, ED - Abnormal; Notable for the following:    Lactic Acid, Venous 2.63 (*)    All other components within normal limits  CULTURE, BLOOD (ROUTINE X 2)  CULTURE, BLOOD (ROUTINE X 2)  INFLUENZA PANEL BY PCR (TYPE A & B, H1N1)   Results for orders placed or performed during the hospital encounter of 08/29/15  CBC with Differential/Platelet  Result Value Ref Range   WBC 15.6 (H) 4.0 - 10.5 K/uL   RBC 3.39 (L) 4.22 - 5.81 MIL/uL   Hemoglobin 8.2 (L) 13.0 - 17.0 g/dL   HCT 26.3 (L) 39.0 - 52.0 %   MCV 77.6 (L) 78.0 - 100.0 fL   MCH 24.2 (L) 26.0 - 34.0 pg   MCHC 31.2 30.0 - 36.0 g/dL   RDW 23.1 (H) 11.5 - 15.5 %   Platelets 228 150 - 400 K/uL   Neutrophils Relative % 76 %   Neutro Abs 11.9 (H) 1.7 - 7.7 K/uL   Lymphocytes Relative 12 %   Lymphs Abs 1.8 0.7 - 4.0 K/uL   Monocytes Relative 11 %   Monocytes Absolute 1.8 (H) 0.1 - 1.0 K/uL   Eosinophils Relative 0 %   Eosinophils Absolute 0.1 0.0 - 0.7 K/uL   Basophils Relative 0 %   Basophils Absolute 0.0 0.0 - 0.1 K/uL   Comprehensive metabolic panel  Result Value Ref Range   Sodium 137 135 - 145 mmol/L   Potassium 4.1 3.5 - 5.1 mmol/L   Chloride 97 (L) 101 - 111 mmol/L   CO2 33 (H) 22 - 32 mmol/L   Glucose, Bld 204 (H) 65 - 99 mg/dL   BUN 34 (H) 6 - 20 mg/dL   Creatinine, Ser 1.75 (H) 0.61 - 1.24 mg/dL   Calcium 8.0 (L) 8.9 - 10.3 mg/dL   Total Protein 5.6 (L) 6.5 - 8.1 g/dL   Albumin 2.4 (L) 3.5 - 5.0 g/dL   AST 23 15 - 41 U/L   ALT 38 17 - 63 U/L   Alkaline Phosphatase 66 38 - 126 U/L   Total Bilirubin 0.5 0.3 - 1.2 mg/dL   GFR calc non Af Amer 42 (L) >60 mL/min   GFR calc Af Amer 48 (L) >60 mL/min   Anion gap 7 5 - 15  I-Stat CG4 Lactic Acid, ED  Result Value Ref Range   Lactic Acid, Venous 2.63 (HH) 0.5 - 2.0 mmol/L   Comment NOTIFIED PHYSICIAN      Imaging Review Dg Chest 2 View  08/29/2015  CLINICAL DATA:  Hypoxia and fever EXAM: CHEST  2 VIEW COMPARISON:  08/17/2015, 05/01/2015 FINDINGS: Cardiac shadow is stable.  Persistent changes are noted in the right lung base when compared with the prior exam. No sizable effusion is seen. Left lung demonstrates evidence of a cavitary appearing lesion in the left upper lobe. This was not as well appreciated on the recent exam due to overlying external artifact. Increased density is noted along the inferior aspect of the cavity which may represent some more acute wall thickening and inflammatory change. Emphysematous changes are again seen. IMPRESSION: Stable changes in the right base. More apparent cavitary lesion in the left upper lobe when compared with the prior exam. This is in part due to overlying artifact although some wall thickening in the inferior aspect of the cavity is noted. CT of the chest is recommended for further evaluation as well as per prior recommendations. Although not mentioned in the body of the report, a left-sided PICC line is again seen and stable. Electronically Signed   By: Inez Catalina M.D.   On: 08/29/2015 10:59   Ct Chest Wo  Contrast  08/29/2015  CLINICAL DATA:  Hypoxia and fever. Cough and shortness of breath. COPD. Enlarging left upper lobe cavitary lesions seen on recent chest radiograph. EXAM: CT CHEST WITHOUT CONTRAST TECHNIQUE: Multidetector CT imaging of the chest was performed following the standard protocol without IV contrast. COMPARISON:  Chest radiograph on 08/29/2015 and CT on 05/01/2015 FINDINGS: Mediastinum/Lymph Nodes: No masses or pathologically enlarged lymph nodes identified on this un-enhanced exam. Tiny hiatal hernia again noted. Three-vessel coronary artery calcification again demonstrated. Lungs/Pleura: Severe emphysema again demonstrated with upper lobe predominance. Peripheral honeycombing is seen in the posterior lower lobes bilaterally which is stable and consistent with chronic interstitial fibrosis. A new tiny right pleural effusion is seen. No evidence of left pleural effusion. A partially solid and partially cavitary lesion is seen in the left upper lobe which has increased in size since previous study. This currently measures 3.7 x 5.4 cm on image 19/series 3 compared to 2.1 x 3.2 cm previously. This is suspicious for enlarging cavitary bronchogenic carcinoma, although differential diagnosis still includes a cavitary infectious or inflammatory process. Upper abdomen: No acute findings. Musculoskeletal: No chest wall mass or suspicious bone lesions identified. IMPRESSION: Enlarging mixed solid and cavitary lesion in the left upper lobe. 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 Consider bronchoscopy or percutaneous needle biopsy. Severe bullous emphysema and chronic bibasilar predominant interstitial fibrosis. Increased tiny right pleural effusion. No definite evidence of thoracic lymphadenopathy. Electronically Signed   By: Earle Gell M.D.   On: 08/29/2015 12:50   I have personally  reviewed and evaluated these images and lab results as part of my medical decision-making.   EKG Interpretation   Date/Time:  Wednesday August 29 2015 09:15:11 EST Ventricular Rate:  112 PR Interval:  109 QRS Duration: 82 QT Interval:  301 QTC Calculation: 411 R Axis:   10 Text Interpretation:  Sinus tachycardia Abnormal R-wave progression, early  transition No significant change since last tracing Confirmed by Nisa Decaire   MD, Tallin Hart [phone]) on 08/29/2015 9:27:11 AM      CRITICAL CARE Performed by: Ryan [name] Total critical care time: 30 minutes Critical care time was exclusive of separately billable procedures and treating other patients. Critical care was necessary to treat or prevent imminent or life-threatening deterioration. Critical care was time spent personally by me on the following activities: development of treatment plan with patient and/or surrogate as well as nursing, discussions with consultants, evaluation  of patient's response to treatment, examination of patient, obtaining history from patient or surrogate, ordering and performing treatments and interventions, ordering and review of laboratory studies, ordering and review of radiographic studies, pulse oximetry and re-evaluation of patient's condition.    MDM   Final diagnoses:  Chronic obstructive pulmonary disease, unspecified COPD type ([place])  Hypoxia  HCAP (healthcare-associated pneumonia)   Patient presented with signs concerning for possible sepsis. Never was hypotensive but had blood pressures in the low 90s. Tachycardic to [place] mentioned went on to develop a fever to 101.6. Patient was admitted to the hospital on February 3 just discharged on February 11 initially brought in for concerns for sepsis appeared to be a COPD exacerbation. Patient was supposed to be discharged home on 7 L of nasal cannula oxygen. When I was first seeing him he was only on 4 L and he had saturations in below 90. Patient's  wife stated that he had saturations 55% at home. Patient was on his oxygen but was only on 4 L. Patient's had chills the past 2 days.  Workup here blood cultures done lactic acid is elevated but is been elevated many times before. Patient's blood pressure has remained above 95%. Heart rate had improved. Patient started on antibiotics for healthcare acquired pneumonia based on the chest x-ray and CT of the chest. Is showing a cavitary lesion which could be infectious could be neoplastic.  Patient not given full fluid challenge. Patient overall feeling better. Heart rates had dropped down to his low as 75 bpm now at 90 bpm. For the development of fever patient received Tylenol.  Discussed with hospitalist. Patient perhaps could be early sepsis like to gases as stated always been elevated so it's a difficult judgment and marker on him. Oxygen status has improved significant by increasing the oxygen currently on 6 L. patient CT scan concerning for perhaps even TB.  Hospitalist will admit.  I personally performed the services described in this documentation, which was scribed in my presence. The recorded information has been reviewed and is accurate.        Ryan Sorrow, MD 08/29/15 [phone]

## 2015-08-29 NOTE — ED Notes (Signed)
Per EMS: Pt was d/c'd Saturday from hospital for pneumonia.  Pt took 02 sat this morning and was 88%.  Pt usually wears 2L 02 [place] at home.  EMS started pt on NRB and then to [place] 4L. Lung sounds diminished, with mild rhonchi.  Pt coughing up yellow sputum. Pt also treated for MRSA in nasal passage.  Pt was treated with antibiotics through PICC that is still currently in place.  Pt in nad at this time. Pt has rash on abdomen.  96% on 4L, 97/61

## 2015-08-30 LAB — URINE MICROSCOPIC-ADD ON
Bacteria, UA: NONE SEEN
SQUAMOUS EPITHELIAL / LPF: NONE SEEN
WBC, UA: NONE SEEN WBC/hpf (ref 0–5)

## 2015-08-30 LAB — URINALYSIS, ROUTINE W REFLEX MICROSCOPIC
BILIRUBIN URINE: NEGATIVE
Glucose, UA: NEGATIVE mg/dL
KETONES UR: NEGATIVE mg/dL
Leukocytes, UA: NEGATIVE
Nitrite: NEGATIVE
PROTEIN: 30 mg/dL — AB
Specific Gravity, Urine: 1.015 (ref 1.005–1.030)
pH: 8 (ref 5.0–8.0)

## 2015-08-30 LAB — GLUCOSE, CAPILLARY
GLUCOSE-CAPILLARY: 105 mg/dL — AB (ref 65–99)
Glucose-Capillary: 112 mg/dL — ABNORMAL HIGH (ref 65–99)
Glucose-Capillary: 108 mg/dL — ABNORMAL HIGH (ref 65–99)
Glucose-Capillary: 96 mg/dL (ref 65–99)

## 2015-08-30 LAB — HIV ANTIBODY (ROUTINE TESTING W REFLEX): HIV Screen 4th Generation wRfx: NONREACTIVE

## 2015-08-30 MED ORDER — OXYCODONE-ACETAMINOPHEN 5-325 MG PO TABS
1.0000 | ORAL_TABLET | Freq: Four times a day (QID) | ORAL | Status: DC | PRN
  Administered 2015-08-30 (×2): 1 via ORAL
  Filled 2015-08-30 (×2): qty 1

## 2015-08-30 MED ORDER — TIOTROPIUM BROMIDE MONOHYDRATE 18 MCG IN CAPS
18.0000 ug | ORAL_CAPSULE | Freq: Every day | RESPIRATORY_TRACT | Status: DC
  Administered 2015-08-30 – 2015-09-05 (×7): 18 ug via RESPIRATORY_TRACT
  Filled 2015-08-30 (×2): qty 5

## 2015-08-30 MED ORDER — ETHAMBUTOL HCL 400 MG PO TABS
1000.0000 mg | ORAL_TABLET | Freq: Every day | ORAL | Status: DC
  Administered 2015-08-30 – 2015-09-05 (×7): 1000 mg via ORAL
  Filled 2015-08-30 (×9): qty 2.5

## 2015-08-30 MED ORDER — VANCOMYCIN HCL IN DEXTROSE 750-5 MG/150ML-% IV SOLN
INTRAVENOUS | Status: AC
  Filled 2015-08-30: qty 150

## 2015-08-30 NOTE — Progress Notes (Signed)
Extensive and convoluted history diagnosed MAI 2012 with treatment recent treatment for this MRSA septicemia with 32 days of vancomycin and also treated for 9 days recently with Maxipime on recent discharge on 08/25/2015 patient presents with hypoxia desaturation cough night sweats chills and consideration given to reactivation of MAI and or left upper lobe bronchogenic carcinoma with combined solid and cavitary lesion which appears new measuring 3 x 5 x 3 cm he is placed on Zithromax and ethambutol and rifampin for the previous 24 hours to cover MAI and on isolation with sputum being collected patient is not on steroids due to possible suppressive issues Ryan [name] IRW:[id number] DOB: 1960/09/15 DOA: 08/29/2015 PCP: Alonza Bogus, MD             Physical Exam: Blood pressure 109/63, pulse 77, temperature 98.2 F (36.8 C), temperature source Oral, resp. rate 20, height '5\' 8"'$  (1.727 m), weight 144 lb (65.318 kg), SpO2 96 %. lungs show prolonged inspiratory and expiratory phase scattered rhonchi bilaterally no rales audible mild end expiratory wheeze noted bilaterally heart regular rhythm no S3 no S4 no heaves thrills rubs abdomen soft nontender bowel sounds normoactive   Investigations:  Recent Results (from the past 240 hour(s))  Culture, blood (Routine X 2) w Reflex to ID Panel     Status: None (Preliminary result)   Collection Time: 08/29/15 12:10 PM  Result Value Ref Range Status   Specimen Description BLOOD RIGHT ANTECUBITAL  Final   Special Requests   Final    BOTTLES DRAWN AEROBIC AND ANAEROBIC 10CC Performed at [facility]    Culture NO GROWTH < 24 HOURS  Final   Report Status PENDING  Incomplete  Culture, blood (Routine X 2) w Reflex to ID Panel     Status: None (Preliminary result)   Collection Time: 08/29/15 12:20 PM  Result Value Ref Range Status   Specimen Description BLOOD RIGHT WRIST  Final   Special Requests BOTTLES DRAWN AEROBIC AND ANAEROBIC [facility] EACH   Final   Culture NO GROWTH < 24 HOURS  Final   Report Status PENDING  Incomplete     Basic Metabolic Panel:  Recent Labs  08/29/15 1025  NA 137  K 4.1  CL 97*  CO2 33*  GLUCOSE 204*  BUN 34*  CREATININE 1.75*  CALCIUM 8.0*   Liver Function Tests:  Recent Labs  08/29/15 1025  AST 23  ALT 38  ALKPHOS 66  BILITOT 0.5  PROT 5.6*  ALBUMIN 2.4*     CBC:  Recent Labs  08/29/15 1025  WBC 15.6*  NEUTROABS 11.9*  HGB 8.2*  HCT 26.3*  MCV 77.6*  PLT 228    Dg Chest 2 View  08/29/2015  CLINICAL DATA:  Hypoxia and fever EXAM: CHEST  2 VIEW COMPARISON:  08/17/2015, 05/01/2015 FINDINGS: Cardiac shadow is stable. Persistent changes are noted in the right lung base when compared with the prior exam. No sizable effusion is seen. Left lung demonstrates evidence of a cavitary appearing lesion in the left upper lobe. This was not as well appreciated on the recent exam due to overlying external artifact. Increased density is noted along the inferior aspect of the cavity which may represent some more acute wall thickening and inflammatory change. Emphysematous changes are again seen. IMPRESSION: Stable changes in the right base. More apparent cavitary lesion in the left upper lobe when compared with the prior exam. This is in part due to overlying artifact although some wall thickening in the inferior aspect  of the cavity is noted. CT of the chest is recommended for further evaluation as well as per prior recommendations. Although not mentioned in the body of the report, a left-sided PICC line is again seen and stable. Electronically Signed   By: Inez Catalina M.D.   On: 08/29/2015 10:59   Ct Chest Wo Contrast  08/29/2015  CLINICAL DATA:  Hypoxia and fever. Cough and shortness of breath. COPD. Enlarging left upper lobe cavitary lesions seen on recent chest radiograph. EXAM: CT CHEST WITHOUT CONTRAST TECHNIQUE: Multidetector CT imaging of the chest was performed following the standard  protocol without IV contrast. COMPARISON:  Chest radiograph on 08/29/2015 and CT on 05/01/2015 FINDINGS: Mediastinum/Lymph Nodes: No masses or pathologically enlarged lymph nodes identified on this un-enhanced exam. Tiny hiatal hernia again noted. Three-vessel coronary artery calcification again demonstrated. Lungs/Pleura: Severe emphysema again demonstrated with upper lobe predominance. Peripheral honeycombing is seen in the posterior lower lobes bilaterally which is stable and consistent with chronic interstitial fibrosis. A new tiny right pleural effusion is seen. No evidence of left pleural effusion. A partially solid and partially cavitary lesion is seen in the left upper lobe which has increased in size since previous study. This currently measures 3.7 x 5.4 cm on image 19/series 3 compared to 2.1 x 3.2 cm previously. This is suspicious for enlarging cavitary bronchogenic carcinoma, although differential diagnosis still includes a cavitary infectious or inflammatory process. Upper abdomen: No acute findings. Musculoskeletal: No chest wall mass or suspicious bone lesions identified. IMPRESSION: Enlarging mixed solid and cavitary lesion in the left upper lobe. 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 Consider bronchoscopy or percutaneous needle biopsy. Severe bullous emphysema and chronic bibasilar predominant interstitial fibrosis. Increased tiny right pleural effusion. No definite evidence of thoracic lymphadenopathy. Electronically Signed   By: Earle Gell M.D.   On: 08/29/2015 12:50      Medications:   Impression: Combined solid and cavitary lesion of left upper lobe suspicious for bronchogenic carcinoma or MAI  Principal Problem:   MAI (mycobacterium avium-intracellulare) (HCC) Active Problems:   TOBACCO ABUSE   SOB (shortness of breath)   Pulmonary cavitary lesion    Chronic atrial fibrillation (HCC)   Pulmonary fibrosis (HCC)     Plan: Continue Zithromax and ethambutol and rifampin continue nebulizer therapy and isolation for sputum AFB smear and cultures   Consultants: Dr. Luan Pulling pulmonologist duplex tomorrow   Procedures   Antibiotics: Zithromax ethambutol and rifampin                   Code Status: Full  Family Communication:  Spoke with wife and patient   Disposition Plan see plan above  Time spent: 40 minutes   LOS: 1 day   Saryiah Bencosme M   08/30/2015, 1:20 PM

## 2015-08-30 NOTE — Clinical Documentation Improvement (Signed)
Internal Medicine  Based on the clinical findings below, please document any associated diagnoses/conditions the patient has or may have.   Sepsis  Other  Clinically Undetermined  Please update your documentation within the medical record to reflect your response to this query. Thank you.  Supporting Information: (As per notes) Vitals on admission:  "BP 97/67 mmHg  Pulse 104  Temp(Src) 101.6 F (38.7 C) (Rectal)  Resp 24  Ht '5\' 8"'$  (1.727 m)  Wt 65.318 kg  BMI 21.90 kg/m2  SpO2 96% "  Labs:  Component     Latest Ref Rng 08/29/2015        12:37 PM  Lactic Acid, Venous     0.5 - 2.0 mmol/L 2.63 (HH)   Component     Latest Ref Rng 08/29/2015          WBC     4.0 - 10.5 K/uL 15.6 (H)   Please exercise your independent, professional judgment when responding. A specific answer is not anticipated or expected.   Thank You, Alessandra Grout, RN, BSN, CCDS,Clinical Documentation Specialist:  [phone]  [phone]=[facility] [place]- [facility] Information Management

## 2015-08-31 LAB — CBC
HCT: 28.1 % — ABNORMAL LOW (ref 39.0–52.0)
HEMOGLOBIN: 8.3 g/dL — AB (ref 13.0–17.0)
MCH: 23.6 pg — AB (ref 26.0–34.0)
MCHC: 29.5 g/dL — AB (ref 30.0–36.0)
MCV: 80.1 fL (ref 78.0–100.0)
Platelets: 259 10*3/uL (ref 150–400)
RBC: 3.51 MIL/uL — ABNORMAL LOW (ref 4.22–5.81)
RDW: 23.1 % — AB (ref 11.5–15.5)
WBC: 8.7 10*3/uL (ref 4.0–10.5)

## 2015-08-31 LAB — QUANTIFERON IN TUBE
QFT TB AG MINUS NIL VALUE: 0 [IU]/mL
QUANTIFERON MITOGEN VALUE: 1.01 IU/mL
QUANTIFERON TB AG VALUE: 0.05 [IU]/mL
QUANTIFERON TB GOLD: NEGATIVE
Quantiferon Nil Value: 0.05 IU/mL

## 2015-08-31 LAB — BASIC METABOLIC PANEL
Anion gap: 7 (ref 5–15)
BUN: 12 mg/dL (ref 6–20)
CALCIUM: 7.8 mg/dL — AB (ref 8.9–10.3)
CHLORIDE: 106 mmol/L (ref 101–111)
CO2: 26 mmol/L (ref 22–32)
CREATININE: 1.24 mg/dL (ref 0.61–1.24)
GFR calc non Af Amer: >60 mL/min (ref >60)
Glucose, Bld: 132 mg/dL — ABNORMAL HIGH (ref 65–99)
Potassium: 4 mmol/L (ref 3.5–5.1)
SODIUM: 139 mmol/L (ref 135–145)

## 2015-08-31 LAB — GLUCOSE, CAPILLARY
Glucose-Capillary: 120 mg/dL — ABNORMAL HIGH (ref 65–99)
Glucose-Capillary: 208 mg/dL — ABNORMAL HIGH (ref 65–99)
Glucose-Capillary: 112 mg/dL — ABNORMAL HIGH (ref 65–99)
Glucose-Capillary: 153 mg/dL — ABNORMAL HIGH (ref 65–99)

## 2015-08-31 LAB — QUANTIFERON TB GOLD ASSAY (BLOOD)

## 2015-08-31 MED ORDER — PIPERACILLIN-TAZOBACTAM 3.375 G IVPB
3.3750 g | Freq: Three times a day (TID) | INTRAVENOUS | Status: DC
  Administered 2015-08-31 – 2015-09-05 (×16): 3.375 g via INTRAVENOUS
  Filled 2015-08-31 (×14): qty 50

## 2015-08-31 MED ORDER — LEVALBUTEROL HCL 0.63 MG/3ML IN NEBU
0.6300 mg | INHALATION_SOLUTION | Freq: Three times a day (TID) | RESPIRATORY_TRACT | Status: DC
  Administered 2015-08-31 – 2015-09-05 (×16): 0.63 mg via RESPIRATORY_TRACT
  Filled 2015-08-31 (×15): qty 3

## 2015-08-31 MED ORDER — DIPHENHYDRAMINE HCL 25 MG PO CAPS
25.0000 mg | ORAL_CAPSULE | Freq: Four times a day (QID) | ORAL | Status: DC | PRN
  Administered 2015-08-31 – 2015-09-04 (×5): 25 mg via ORAL
  Filled 2015-08-31 (×5): qty 1

## 2015-08-31 MED ORDER — OXYCODONE HCL 5 MG PO TABS
5.0000 mg | ORAL_TABLET | ORAL | Status: DC
  Administered 2015-08-31 – 2015-09-05 (×32): 5 mg via ORAL
  Filled 2015-08-31 (×32): qty 1

## 2015-08-31 MED ORDER — OXYCODONE-ACETAMINOPHEN 5-325 MG PO TABS
1.0000 | ORAL_TABLET | ORAL | Status: DC
  Administered 2015-08-31 – 2015-09-05 (×32): 1 via ORAL
  Filled 2015-08-31 (×32): qty 1

## 2015-08-31 MED ORDER — OXYCODONE-ACETAMINOPHEN 10-325 MG PO TABS: 1.0000 | ORAL_TABLET | ORAL | Status: DC

## 2015-08-31 NOTE — Care Management Important Message (Signed)
Important Message  Patient Details  Name: Ryan [name] MRN: [id number] Date of Birth: 08-Oct-1960   Medicare Important Message Given:  Yes    Alvie Heidelberg, RN 08/31/2015, 4:58 PM

## 2015-08-31 NOTE — Progress Notes (Signed)
[facility]  Patient Status: Active (receiving services up to time of hospitalization)  [facility] is providing the following services: RN, PT  If patient discharges after hours, please call [phone].   [place] 08/31/2015, 12:57 PM

## 2015-08-31 NOTE — Progress Notes (Signed)
Subjective: He was admitted with febrile illness. He has an enlarging cavitary lesion. 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 He is coughing productively. He also has a new rash.  Objective: Vital signs in last 24 hours: Temp:  [98.2 F (36.8 C)-98.3 F (36.8 C)] 98.3 F (36.8 C) (02/17 0656) Pulse Rate:  [86] 86 (02/17 0656) Resp:  [20] 20 (02/16 2152) BP: (122-130)/(72-76) 122/76 mmHg (02/17 0656) SpO2:  [93 %-96 %] 93 % (02/17 0656) Weight change:  Last BM Date: 08/24/15  Intake/Output from previous day: 02/16 0701 - 02/17 0700 In: 1320 [P.O.:1320] Out: 2025 [Urine:2025]  PHYSICAL EXAM General appearance: alert, cooperative and mild distress Resp: rhonchi bilaterally Cardio: regular rate and rhythm, S1, S2 normal, no murmur, click, rub or gallop GI: soft, non-tender; bowel sounds normal; no masses,  no organomegaly Extremities: His edema of his legs has improved  Lab Results:  Results for orders placed or performed during the hospital encounter of 08/29/15 (from the past 48 hour(s))  CBC with Differential/Platelet     Status: Abnormal   Collection Time: 08/29/15 10:25 AM  Result Value Ref Range   WBC 15.6 (H) 4.0 - 10.5 K/uL   RBC 3.39 (L) 4.22 - 5.81 MIL/uL   Hemoglobin 8.2 (L) 13.0 - 17.0 g/dL   HCT 26.3 (L) 39.0 - 52.0 %   MCV 77.6 (L) 78.0 - 100.0 fL   MCH 24.2 (L) 26.0 - 34.0 pg   MCHC 31.2 30.0 - 36.0 g/dL   RDW 23.1 (H) 11.5 - 15.5 %   Platelets 228 150 - 400 K/uL   Neutrophils Relative % 76 %   Neutro Abs 11.9 (H) 1.7 - 7.7 K/uL   Lymphocytes Relative 12 %   Lymphs Abs 1.8 0.7 - 4.0 K/uL   Monocytes Relative 11 %   Monocytes Absolute 1.8 (H) 0.1 - 1.0 K/uL   Eosinophils Relative 0 %   Eosinophils Absolute 0.1 0.0 - 0.7 K/uL   Basophils Relative 0 %   Basophils Absolute 0.0 0.0 - 0.1 K/uL  Comprehensive  metabolic panel     Status: Abnormal   Collection Time: 08/29/15 10:25 AM  Result Value Ref Range   Sodium 137 135 - 145 mmol/L   Potassium 4.1 3.5 - 5.1 mmol/L   Chloride 97 (L) 101 - 111 mmol/L   CO2 33 (H) 22 - 32 mmol/L   Glucose, Bld 204 (H) 65 - 99 mg/dL   BUN 34 (H) 6 - 20 mg/dL   Creatinine, Ser 1.75 (H) 0.61 - 1.24 mg/dL   Calcium 8.0 (L) 8.9 - 10.3 mg/dL   Total Protein 5.6 (L) 6.5 - 8.1 g/dL   Albumin 2.4 (L) 3.5 - 5.0 g/dL   AST 23 15 - 41 U/L   ALT 38 17 - 63 U/L   Alkaline Phosphatase 66 38 - 126 U/L   Total Bilirubin 0.5 0.3 - 1.2 mg/dL   GFR calc non Af Amer 42 (L) >60 mL/min   GFR calc Af Amer 48 (L) >60 mL/min    Comment: (NOTE) The eGFR has been calculated using the CKD EPI equation. This calculation has not been validated in all clinical situations. eGFR's persistently <60 mL/min signify possible Chronic Kidney Disease.    Anion gap 7 5 - 15  HIV antibody     Status: None   Collection  Time: 08/29/15 10:25 AM  Result Value Ref Range   HIV Screen 4th Generation wRfx Non Reactive Non Reactive    Comment: (NOTE) Performed At: [facility] [address] [place], [place] [id number] Lindon Romp MD DG:[id number]   Influenza panel by PCR (type A & B, H1N1)     Status: None   Collection Time: 08/29/15 12:00 PM  Result Value Ref Range   Influenza A By PCR NEGATIVE NEGATIVE   Influenza B By PCR NEGATIVE NEGATIVE   H1N1 flu by pcr NOT DETECTED NOT DETECTED    Comment:        The Xpert Flu assay (FDA approved for nasal aspirates or washes and nasopharyngeal swab specimens), is intended as an aid in the diagnosis of influenza and should not be used as a sole basis for treatment.   Culture, blood (Routine X 2) w Reflex to ID Panel     Status: None (Preliminary result)   Collection Time: 08/29/15 12:10 PM  Result Value Ref Range   Specimen Description BLOOD RIGHT ANTECUBITAL    Special Requests BOTTLES DRAWN AEROBIC AND ANAEROBIC 10CC    Culture   Setup Time      GRAM NEGATIVE RODS ANAEROBIC BOTTLE ONLY CRITICAL RESULT CALLED TO, READ BACK BY AND VERIFIED WITH: V DILDY,RN AT 1333 08/30/15 BY L BENFIELD    Culture      GRAM NEGATIVE RODS Performed at [facility]    Report Status PENDING   Culture, blood (Routine X 2) w Reflex to ID Panel     Status: None (Preliminary result)   Collection Time: 08/29/15 12:20 PM  Result Value Ref Range   Specimen Description BLOOD RIGHT WRIST    Special Requests BOTTLES DRAWN AEROBIC AND ANAEROBIC 6CC EACH    Culture NO GROWTH 2 DAYS    Report Status PENDING   I-Stat CG4 Lactic Acid, ED     Status: Abnormal   Collection Time: 08/29/15 12:37 PM  Result Value Ref Range   Lactic Acid, Venous 2.63 (HH) 0.5 - 2.0 mmol/L   Comment NOTIFIED PHYSICIAN   I-Stat CG4 Lactic Acid, ED     Status: None   Collection Time: 08/29/15  3:46 PM  Result Value Ref Range   Lactic Acid, Venous 0.86 0.5 - 2.0 mmol/L  Glucose, capillary     Status: Abnormal   Collection Time: 08/29/15 10:37 PM  Result Value Ref Range   Glucose-Capillary 184 (H) 65 - 99 mg/dL   Comment 1 Notify RN    Comment 2 Document in Chart   Glucose, capillary     Status: Abnormal   Collection Time: 08/30/15  8:10 AM  Result Value Ref Range   Glucose-Capillary 105 (H) 65 - 99 mg/dL  Urinalysis, Routine w reflex microscopic (not at [facility])     Status: Abnormal   Collection Time: 08/30/15  9:45 AM  Result Value Ref Range   Color, Urine ORANGE (A) YELLOW    Comment: BIOCHEMICALS MAY BE AFFECTED BY COLOR   APPearance CLEAR CLEAR   Specific Gravity, Urine 1.015 1.005 - 1.030   pH 8.0 5.0 - 8.0   Glucose, UA NEGATIVE NEGATIVE mg/dL   Hgb urine dipstick SMALL (A) NEGATIVE   Bilirubin Urine NEGATIVE NEGATIVE   Ketones, ur NEGATIVE NEGATIVE mg/dL   Protein, ur 30 (A) NEGATIVE mg/dL   Nitrite NEGATIVE NEGATIVE   Leukocytes, UA NEGATIVE NEGATIVE  Urine microscopic-add on     Status: None   Collection Time: 08/30/15  9:45 AM  Result  Value Ref Range   Squamous Epithelial / LPF NONE SEEN NONE SEEN   WBC, UA NONE SEEN 0 - 5 WBC/hpf   RBC / HPF 0-5 0 - 5 RBC/hpf   Bacteria, UA NONE SEEN NONE SEEN  Glucose, capillary     Status: Abnormal   Collection Time: 08/30/15 11:41 AM  Result Value Ref Range   Glucose-Capillary 112 (H) 65 - 99 mg/dL  Glucose, capillary     Status: Abnormal   Collection Time: 08/30/15  4:28 PM  Result Value Ref Range   Glucose-Capillary 108 (H) 65 - 99 mg/dL   Comment 1 Notify RN    Comment 2 Document in Chart   Glucose, capillary     Status: None   Collection Time: 08/30/15 10:49 PM  Result Value Ref Range   Glucose-Capillary 96 65 - 99 mg/dL   Comment 1 Notify RN    Comment 2 Document in Chart   CBC     Status: Abnormal   Collection Time: 08/31/15  6:30 AM  Result Value Ref Range   WBC 8.7 4.0 - 10.5 K/uL   RBC 3.51 (L) 4.22 - 5.81 MIL/uL   Hemoglobin 8.3 (L) 13.0 - 17.0 g/dL   HCT 28.1 (L) 39.0 - 52.0 %   MCV 80.1 78.0 - 100.0 fL   MCH 23.6 (L) 26.0 - 34.0 pg   MCHC 29.5 (L) 30.0 - 36.0 g/dL   RDW 23.1 (H) 11.5 - 15.5 %   Platelets 259 150 - 400 K/uL  Basic metabolic panel     Status: Abnormal   Collection Time: 08/31/15  6:30 AM  Result Value Ref Range   Sodium 139 135 - 145 mmol/L   Potassium 4.0 3.5 - 5.1 mmol/L   Chloride 106 101 - 111 mmol/L   CO2 26 22 - 32 mmol/L   Glucose, Bld 132 (H) 65 - 99 mg/dL   BUN 12 6 - 20 mg/dL   Creatinine, Ser 1.24 0.61 - 1.24 mg/dL   Calcium 7.8 (L) 8.9 - 10.3 mg/dL   GFR calc non Af Amer >60 >60 mL/min   GFR calc Af Amer >60 >60 mL/min    Comment: (NOTE) The eGFR has been calculated using the CKD EPI equation. This calculation has not been validated in all clinical situations. eGFR's persistently <60 mL/min signify possible Chronic Kidney Disease.    Anion gap 7 5 - 15  Glucose, capillary     Status: Abnormal   Collection Time: 08/31/15  7:59 AM  Result Value Ref Range   Glucose-Capillary 120 (H) 65 - 99 mg/dL    ABGS No results  for input(s): PHART, PO2ART, TCO2, HCO3 in the last 72 hours.  Invalid input(s): PCO2 CULTURES Recent Results (from the past 240 hour(s))  Culture, blood (Routine X 2) w Reflex to ID Panel     Status: None (Preliminary result)   Collection Time: 08/29/15 12:10 PM  Result Value Ref Range Status   Specimen Description BLOOD RIGHT ANTECUBITAL  Final   Special Requests BOTTLES DRAWN AEROBIC AND ANAEROBIC 10CC  Final   Culture  Setup Time   Final    GRAM NEGATIVE RODS ANAEROBIC BOTTLE ONLY CRITICAL RESULT CALLED TO, READ BACK BY AND VERIFIED WITH: V DILDY,RN AT 1333 08/30/15 BY L BENFIELD    Culture   Final    GRAM NEGATIVE RODS Performed at [facility]    Report Status PENDING  Incomplete  Culture, blood (Routine X 2)  w Reflex to ID Panel     Status: None (Preliminary result)   Collection Time: 08/29/15 12:20 PM  Result Value Ref Range Status   Specimen Description BLOOD RIGHT WRIST  Final   Special Requests BOTTLES DRAWN AEROBIC AND ANAEROBIC [place]  Final   Culture NO GROWTH 2 DAYS  Final   Report Status PENDING  Incomplete   Studies/Results: Dg Chest 2 View  08/29/2015  CLINICAL DATA:  Hypoxia and fever EXAM: CHEST  2 VIEW COMPARISON:  08/17/2015, 05/01/2015 FINDINGS: Cardiac shadow is stable. Persistent changes are noted in the right lung base when compared with the prior exam. No sizable effusion is seen. Left lung demonstrates evidence of a cavitary appearing lesion in the left upper lobe. This was not as well appreciated on the recent exam due to overlying external artifact. Increased density is noted along the inferior aspect of the cavity which may represent some more acute wall thickening and inflammatory change. Emphysematous changes are again seen. IMPRESSION: Stable changes in the right base. More apparent cavitary lesion in the left upper lobe when compared with the prior exam. This is in part due to overlying artifact although some wall thickening in the inferior  aspect of the cavity is noted. CT of the chest is recommended for further evaluation as well as per prior recommendations. Although not mentioned in the body of the report, a left-sided PICC line is again seen and stable. Electronically Signed   By: Inez Catalina M.D.   On: 08/29/2015 10:59   Ct Chest Wo Contrast  08/29/2015  CLINICAL DATA:  Hypoxia and fever. Cough and shortness of breath. COPD. Enlarging left upper lobe cavitary lesions seen on recent chest radiograph. EXAM: CT CHEST WITHOUT CONTRAST TECHNIQUE: Multidetector CT imaging of the chest was performed following the standard protocol without IV contrast. COMPARISON:  Chest radiograph on 08/29/2015 and CT on 05/01/2015 FINDINGS: Mediastinum/Lymph Nodes: No masses or pathologically enlarged lymph nodes identified on this un-enhanced exam. Tiny hiatal hernia again noted. Three-vessel coronary artery calcification again demonstrated. Lungs/Pleura: Severe emphysema again demonstrated with upper lobe predominance. Peripheral honeycombing is seen in the posterior lower lobes bilaterally which is stable and consistent with chronic interstitial fibrosis. A new tiny right pleural effusion is seen. No evidence of left pleural effusion. A partially solid and partially cavitary lesion is seen in the left upper lobe which has increased in size since previous study. This currently measures 3.7 x 5.4 cm on image 19/series 3 compared to 2.1 x 3.2 cm previously. This is suspicious for enlarging cavitary bronchogenic carcinoma, although differential diagnosis still includes a cavitary infectious or inflammatory process. Upper abdomen: No acute findings. Musculoskeletal: No chest wall mass or suspicious bone lesions identified. IMPRESSION: Enlarging mixed solid and cavitary lesion in the left upper lobe. 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 Consider  bronchoscopy or percutaneous needle biopsy. Severe bullous emphysema and chronic bibasilar predominant interstitial fibrosis. Increased tiny right pleural effusion. No definite evidence of thoracic lymphadenopathy. Electronically Signed   By: Earle Gell M.D.   On: 08/29/2015 12:50    Medications:  Prior to Admission:  Prescriptions prior to admission  Medication Sig Dispense Refill Last Dose  . albuterol (VENTOLIN HFA) 108 (90 BASE) MCG/ACT inhaler Inhale 2 puffs into the lungs every 6 (six) hours as needed for wheezing or shortness of breath.   08/29/2015 at Unknown time  . baclofen (LIORESAL) 10 MG  tablet Take 10 mg by mouth 3 (three) times daily.    08/28/2015 at Unknown time  . benzonatate (TESSALON) 200 MG capsule Take 200 mg by mouth 3 (three) times daily.   08/28/2015 at Unknown time  . Dextromethorphan-Guaifenesin 20-400 MG TABS Take 1 tablet by mouth every morning.    08/28/2015 at Unknown time  . diltiazem (CARDIZEM CD) 120 MG 24 hr capsule Take 1 capsule (120 mg total) by mouth daily. 30 capsule 11 08/28/2015 at Unknown time  . diphenhydrAMINE (BENADRYL) 25 MG tablet Take 50 mg by mouth every morning.   08/28/2015 at Unknown time  . fluticasone (FLONASE) 50 MCG/ACT nasal spray Place 2 sprays into both nostrils daily as needed for allergies.    unknown  . furosemide (LASIX) 40 MG tablet Take 40 mg by mouth 2 (two) times daily.   08/28/2015 at Unknown time  . insulin glargine (LANTUS) 100 UNIT/ML injection Inject 0.35 mLs (35 Units total) into the skin daily. 10 mL 11 08/28/2015 at Unknown time  . insulin lispro (HUMALOG) 100 UNIT/ML injection Inject into the skin 3 (three) times daily before meals. Per sliding scale   08/28/2015 at Unknown time  . levalbuterol (XOPENEX) 0.63 MG/3ML nebulizer solution Take 3 mLs (0.63 mg total) by nebulization 3 (three) times daily. (Patient taking differently: Take 0.63 mg by nebulization every 8 (eight) hours as needed for wheezing or shortness of breath. ) 3 mL  12 08/28/2015 at Unknown time  . LORazepam (ATIVAN) 0.5 MG tablet Take 1 tablet (0.5 mg total) by mouth 2 (two) times daily as needed for anxiety or sleep. (Patient taking differently: Take 0.5 mg by mouth 4 (four) times daily as needed for anxiety or sleep. ) 20 tablet 0 08/28/2015 at Unknown time  . Magnesium 500 MG TABS Take 500 mg by mouth daily.   08/28/2015 at Unknown time  . metFORMIN (GLUCOPHAGE) 500 MG tablet Take 1 tablet (500 mg total) by mouth daily with breakfast. 30 tablet 3 08/28/2015 at Unknown time  . mometasone-formoterol (DULERA) 100-5 MCG/ACT AERO Inhale 2 puffs into the lungs 2 (two) times daily.   08/29/2015 at Unknown time  . montelukast (SINGULAIR) 10 MG tablet Take 10 mg by mouth at bedtime.   08/28/2015 at Unknown time  . nicotine (NICODERM CQ - DOSED IN MG/24 HOURS) 14 mg/24hr patch Place 14 mg onto the skin daily.   08/28/2015 at Unknown time  . nitroGLYCERIN (NITROSTAT) 0.4 MG SL tablet Place 1 tablet (0.4 mg total) under the tongue every 5 (five) minutes as needed for chest pain. 25 tablet 4 unknown  . oxyCODONE-acetaminophen (PERCOCET) 10-325 MG tablet Take 1 tablet by mouth every 4 (four) hours as needed for pain.   08/28/2015 at Unknown time  . pantoprazole (PROTONIX) 40 MG tablet Take 1 tablet (40 mg total) by mouth 2 (two) times daily. 60 tablet 0 08/28/2015 at Unknown time  . potassium chloride SA (K-DUR,KLOR-CON) 20 MEQ tablet Take 20 mEq by mouth 2 (two) times daily.   08/28/2015 at Unknown time  . SPIRIVA RESPIMAT 2.5 MCG/ACT AERS Inhale 1 puff into the lungs daily.   08/28/2015 at Unknown time  . varenicline (CHANTIX) 0.5 MG tablet Take 1 tablet (0.5 mg total) by mouth 2 (two) times daily. 60 tablet 2 unknown  . edoxaban (SAVAYSA) 60 MG TABS tablet Take 60 mg by mouth daily. (Patient not taking: Reported on 08/29/2015) 30 tablet 11 08/17/2015 at Unknown time  . insulin aspart (NOVOLOG) 100 UNIT/ML injection Inject 0-20  Units into the skin 3 (three) times daily with meals.  (Patient not taking: Reported on 08/29/2015) 10 mL 11 08/17/2015 at Unknown time  . nicotine (NICODERM CQ - DOSED IN MG/24 HOURS) 21 mg/24hr patch Place 1 patch (21 mg total) onto the skin daily. (Patient not taking: Reported on 08/17/2015) 28 patch 0 Taking  . PHARMACIST CHOICE LANCETS MISC USE 2 TIMES DAILY FOR DIABETIC TESTING (Patient not taking: Reported on 08/29/2015) 200 each 0   . predniSONE (DELTASONE) 10 MG tablet Take 4 tablets (40 mg total) by mouth daily with breakfast. (Patient not taking: Reported on 08/17/2015) 30 tablet 1 Completed Course at Unknown time   Scheduled: . antiseptic oral rinse  7 mL Mouth Rinse BID  . azithromycin  500 mg Intravenous Q24H  . diltiazem  120 mg Oral Daily  . enoxaparin (LOVENOX) injection  40 mg Subcutaneous Q24H  . ethambutol  1,000 mg Oral Daily  . insulin aspart  0-20 Units Subcutaneous TID WC  . insulin aspart  0-5 Units Subcutaneous QHS  . insulin aspart  3 Units Subcutaneous TID WC  . levalbuterol  0.63 mg Nebulization Q8H  . metFORMIN  500 mg Oral Q breakfast  . nicotine  14 mg Transdermal Daily  . nicotine  21 mg Transdermal Daily  . oxyCODONE-acetaminophen  1 tablet Oral Q4H   And  . oxyCODONE  5 mg Oral Q4H  . pantoprazole  40 mg Oral BID  . rifampin  600 mg Oral Daily  . tiotropium  18 mcg Inhalation Daily  . vancomycin  750 mg Intravenous Q12H   Continuous: . sodium chloride 75 mL/hr at 08/29/15 1848   WUJ:[id number], HYDROcodone-acetaminophen, LORazepam, oxyCODONE-acetaminophen  Assesment: He has COPD exacerbation. He was septic on admission and that has improved. He has atrial fibrillation and is chronically anticoagulated. He has severe COPD at baseline. He has chronic hypoxic respiratory failure. He has a partially cavitary mass in his alone and that may be cancer or mycobacterial infection Principal Problem:   MAI (mycobacterium avium-intracellulare) (HCC) Active Problems:   TOBACCO ABUSE   SOB (shortness of breath)    Pulmonary cavitary lesion   Chronic atrial fibrillation (HCC)   Pulmonary fibrosis (HCC)   Sepsis ([place])    Plan:continue rx.    LOS: 2 days   Corvette Orser L 08/31/2015, 9:03 AM

## 2015-08-31 NOTE — Care Management Note (Signed)
Case Management Note  Patient Details  Name: Ryan [name] MRN: [id number] Date of Birth: 1961/07/04  Subjective/Objective:          Patient open to [facility] , Resumption of home health orders placed.          Action/Plan:  Home with Home Health Expected Discharge Date:                  Expected Discharge Plan:  [place]  In-House Referral:     Discharge planning Services  CM Consult  Post Acute Care Choice:    Choice offered to:     DME Arranged:    DME Agency:  [facility] Arranged:  RN, PT [facility] Agency:     Status of Service:  In process, will continue to follow  Medicare Important Message Given:  Yes Date Medicare IM Given:    Medicare IM give by:    Date Additional Medicare IM Given:    Additional Medicare Important Message give by:     If discussed at [place] of Stay Meetings, dates discussed:    Additional Comments:  Alvie Heidelberg, RN 08/31/2015, 4:59 PM

## 2015-09-01 LAB — GLUCOSE, CAPILLARY
GLUCOSE-CAPILLARY: 130 mg/dL — AB (ref 65–99)
GLUCOSE-CAPILLARY: 178 mg/dL — AB (ref 65–99)
Glucose-Capillary: 146 mg/dL — ABNORMAL HIGH (ref 65–99)
Glucose-Capillary: 158 mg/dL — ABNORMAL HIGH (ref 65–99)

## 2015-09-01 LAB — VANCOMYCIN, TROUGH: Vancomycin Tr: 17 ug/mL (ref 10.0–20.0)

## 2015-09-01 NOTE — Plan of Care (Signed)
Problem: Pain Managment: Goal: General experience of comfort will improve Outcome: Progressing Pt has scheduled pain medicine.  Problem: Physical Regulation: Goal: Ability to maintain clinical measurements within normal limits will improve Outcome: Progressing See flowsheet

## 2015-09-01 NOTE — Progress Notes (Signed)
Pharmacy Antibiotic Note  Ryan [name] is a 55 y.o. male admitted on 08/29/2015 with pneumonia.  Pharmacy has been consulted for vancomycin dosing. He just completed a course of vancomycin last week for MRSA bacteremia and had a recent admission for PNA  Plan: Vancomycin trough at goal Continue 750 mg IV q12 hours Repeat VT next week or earlier if labs indicate   Height: '5\' 8"'$  (172.7 cm) Weight: 144 lb (65.318 kg) IBW/kg (Calculated) : 68.4  Temp (24hrs), Avg:98.4 F (36.9 C), Min:98 F (36.7 C), Max:99 F (37.2 C)   Recent Labs Lab 08/29/15 1025 08/29/15 1237 08/29/15 1546 08/31/15 0630 08/31/15 2331  WBC 15.6*  --   --  8.7  --   CREATININE 1.75*  --   --  1.24  --   LATICACIDVEN  --  2.63* 0.86  --   --   VANCOTROUGH  --   --   --   --  17    Estimated Creatinine Clearance: 61.4 mL/min (by C-G formula based on Cr of 1.24).    Allergies  Allergen Reactions  . Albuterol Palpitations  . Influenza Vaccine Live Swelling    Thank you for allowing pharmacy to be a part of this patient's care.  Chriss Czar 09/01/2015 2:02 PM

## 2015-09-01 NOTE — Progress Notes (Signed)
Subjective: He feels better today. He is still producing sputum. He has no new complaints.  Objective: Vital signs in last 24 hours: Temp:  [98 F (36.7 C)-99 F (37.2 C)] 98 F (36.7 C) (02/18 0557) Pulse Rate:  [77-99] 77 (02/18 0557) Resp:  [20] 20 (02/18 0557) BP: (132-139)/(66-74) 139/66 mmHg (02/18 0557) SpO2:  [94 %-99 %] 96 % (02/18 0630) FiO2 (%):  [40 %] 40 % (02/18 0630) Weight change:  Last BM Date: 08/31/15  Intake/Output from previous day: 02/17 0701 - 02/18 0700 In: 7386.3 [P.O.:600; I.V.:5136.3; IV Piggyback:1650] Out: 1200 [Urine:1200]  PHYSICAL EXAM General appearance: alert, cooperative, mild distress and Chronically sick Resp: rhonchi bilaterally Cardio: regular rate and rhythm, S1, S2 normal, no murmur, click, rub or gallop GI: soft, non-tender; bowel sounds normal; no masses,  no organomegaly Extremities: He still has trace to 1+ edema in his feet but generally better  Lab Results:  Results for orders placed or performed during the hospital encounter of 08/29/15 (from the past 48 hour(s))  Glucose, capillary     Status: Abnormal   Collection Time: 08/30/15 11:41 AM  Result Value Ref Range   Glucose-Capillary 112 (H) 65 - 99 mg/dL  Glucose, capillary     Status: Abnormal   Collection Time: 08/30/15  4:28 PM  Result Value Ref Range   Glucose-Capillary 108 (H) 65 - 99 mg/dL   Comment 1 Notify RN    Comment 2 Document in Chart   Glucose, capillary     Status: None   Collection Time: 08/30/15 10:49 PM  Result Value Ref Range   Glucose-Capillary 96 65 - 99 mg/dL   Comment 1 Notify RN    Comment 2 Document in Chart   CBC     Status: Abnormal   Collection Time: 08/31/15  6:30 AM  Result Value Ref Range   WBC 8.7 4.0 - 10.5 K/uL   RBC 3.51 (L) 4.22 - 5.81 MIL/uL   Hemoglobin 8.3 (L) 13.0 - 17.0 g/dL   HCT 28.1 (L) 39.0 - 52.0 %   MCV 80.1 78.0 - 100.0 fL   MCH 23.6 (L) 26.0 - 34.0 pg   MCHC 29.5 (L) 30.0 - 36.0 g/dL   RDW 23.1 (H) 11.5 - 15.5 %    Platelets 259 150 - 400 K/uL  Basic metabolic panel     Status: Abnormal   Collection Time: 08/31/15  6:30 AM  Result Value Ref Range   Sodium 139 135 - 145 mmol/L   Potassium 4.0 3.5 - 5.1 mmol/L   Chloride 106 101 - 111 mmol/L   CO2 26 22 - 32 mmol/L   Glucose, Bld 132 (H) 65 - 99 mg/dL   BUN 12 6 - 20 mg/dL   Creatinine, Ser 1.24 0.61 - 1.24 mg/dL   Calcium 7.8 (L) 8.9 - 10.3 mg/dL   GFR calc non Af Amer >60 >60 mL/min   GFR calc Af Amer >60 >60 mL/min    Comment: (NOTE) The eGFR has been calculated using the CKD EPI equation. This calculation has not been validated in all clinical situations. eGFR's persistently <60 mL/min signify possible Chronic Kidney Disease.    Anion gap 7 5 - 15  Glucose, capillary     Status: Abnormal   Collection Time: 08/31/15  7:59 AM  Result Value Ref Range   Glucose-Capillary 120 (H) 65 - 99 mg/dL  Glucose, capillary     Status: Abnormal   Collection Time: 08/31/15 11:18 AM  Result Value Ref  Range   Glucose-Capillary 208 (H) 65 - 99 mg/dL  Glucose, capillary     Status: Abnormal   Collection Time: 08/31/15  4:16 PM  Result Value Ref Range   Glucose-Capillary 112 (H) 65 - 99 mg/dL  Culture, respiratory (NON-Expectorated)     Status: None (Preliminary result)   Collection Time: 08/31/15  4:20 PM  Result Value Ref Range   Specimen Description SPUTUM EXPECTORATED    Special Requests NONE    Gram Stain      ABUNDANT WBC PRESENT,BOTH PMN AND MONONUCLEAR FEW SQUAMOUS EPITHELIAL CELLS PRESENT MODERATE GRAM POSITIVE COCCI IN PAIRS IN CLUSTERS FEW GRAM NEGATIVE RODS THIS SPECIMEN IS ACCEPTABLE FOR SPUTUM CULTURE Performed at [facility]    Culture PENDING    Report Status PENDING   Glucose, capillary     Status: Abnormal   Collection Time: 08/31/15  8:57 PM  Result Value Ref Range   Glucose-Capillary 153 (H) 65 - 99 mg/dL   Comment 1 Notify RN    Comment 2 Document in Chart   Vancomycin, trough     Status: None   Collection  Time: 08/31/15 11:31 PM  Result Value Ref Range   Vancomycin Tr 17 10.0 - 20.0 ug/mL  Glucose, capillary     Status: Abnormal   Collection Time: 09/01/15  7:38 AM  Result Value Ref Range   Glucose-Capillary 146 (H) 65 - 99 mg/dL    ABGS No results for input(s): PHART, PO2ART, TCO2, HCO3 in the last 72 hours.  Invalid input(s): PCO2 CULTURES Recent Results (from the past 240 hour(s))  Culture, blood (Routine X 2) w Reflex to ID Panel     Status: None (Preliminary result)   Collection Time: 08/29/15 12:10 PM  Result Value Ref Range Status   Specimen Description BLOOD RIGHT ANTECUBITAL  Final   Special Requests BOTTLES DRAWN AEROBIC AND ANAEROBIC 10CC  Final   Culture  Setup Time   Final    GRAM NEGATIVE RODS ANAEROBIC BOTTLE ONLY CRITICAL RESULT CALLED TO, READ BACK BY AND VERIFIED WITH: V DILDY,RN AT 1333 08/30/15 BY L BENFIELD    Culture   Final    PANTOEA SPECIES SENT TO REFERENCE LAB FOR SENSITIVITIES Performed at [facility]    Report Status PENDING  Incomplete  Culture, blood (Routine X 2) w Reflex to ID Panel     Status: None (Preliminary result)   Collection Time: 08/29/15 12:20 PM  Result Value Ref Range Status   Specimen Description BLOOD RIGHT WRIST  Final   Special Requests BOTTLES DRAWN AEROBIC AND ANAEROBIC 6CC EACH  Final   Culture NO GROWTH 3 DAYS  Final   Report Status PENDING  Incomplete  Culture, respiratory (NON-Expectorated)     Status: None (Preliminary result)   Collection Time: 08/31/15  4:20 PM  Result Value Ref Range Status   Specimen Description SPUTUM EXPECTORATED  Final   Special Requests NONE  Final   Gram Stain   Final    ABUNDANT WBC PRESENT,BOTH PMN AND MONONUCLEAR FEW SQUAMOUS EPITHELIAL CELLS PRESENT MODERATE GRAM POSITIVE COCCI IN PAIRS IN CLUSTERS FEW GRAM NEGATIVE RODS THIS SPECIMEN IS ACCEPTABLE FOR SPUTUM CULTURE Performed at [facility]    Culture PENDING  Incomplete   Report Status PENDING  Incomplete    Studies/Results: No results found.  Medications:  Prior to Admission:  Prescriptions prior to admission  Medication Sig Dispense Refill Last Dose  . albuterol (VENTOLIN HFA) 108 (90 BASE) MCG/ACT inhaler Inhale 2 puffs  into the lungs every 6 (six) hours as needed for wheezing or shortness of breath.   08/29/2015 at Unknown time  . baclofen (LIORESAL) 10 MG tablet Take 10 mg by mouth 3 (three) times daily.    08/28/2015 at Unknown time  . benzonatate (TESSALON) 200 MG capsule Take 200 mg by mouth 3 (three) times daily.   08/28/2015 at Unknown time  . Dextromethorphan-Guaifenesin 20-400 MG TABS Take 1 tablet by mouth every morning.    08/28/2015 at Unknown time  . diltiazem (CARDIZEM CD) 120 MG 24 hr capsule Take 1 capsule (120 mg total) by mouth daily. 30 capsule 11 08/28/2015 at Unknown time  . diphenhydrAMINE (BENADRYL) 25 MG tablet Take 50 mg by mouth every morning.   08/28/2015 at Unknown time  . fluticasone (FLONASE) 50 MCG/ACT nasal spray Place 2 sprays into both nostrils daily as needed for allergies.    unknown  . furosemide (LASIX) 40 MG tablet Take 40 mg by mouth 2 (two) times daily.   08/28/2015 at Unknown time  . insulin glargine (LANTUS) 100 UNIT/ML injection Inject 0.35 mLs (35 Units total) into the skin daily. 10 mL 11 08/28/2015 at Unknown time  . insulin lispro (HUMALOG) 100 UNIT/ML injection Inject into the skin 3 (three) times daily before meals. Per sliding scale   08/28/2015 at Unknown time  . levalbuterol (XOPENEX) 0.63 MG/3ML nebulizer solution Take 3 mLs (0.63 mg total) by nebulization 3 (three) times daily. (Patient taking differently: Take 0.63 mg by nebulization every 8 (eight) hours as needed for wheezing or shortness of breath. ) 3 mL 12 08/28/2015 at Unknown time  . LORazepam (ATIVAN) 0.5 MG tablet Take 1 tablet (0.5 mg total) by mouth 2 (two) times daily as needed for anxiety or sleep. (Patient taking differently: Take 0.5 mg by mouth 4 (four) times daily as needed for  anxiety or sleep. ) 20 tablet 0 08/28/2015 at Unknown time  . Magnesium 500 MG TABS Take 500 mg by mouth daily.   08/28/2015 at Unknown time  . metFORMIN (GLUCOPHAGE) 500 MG tablet Take 1 tablet (500 mg total) by mouth daily with breakfast. 30 tablet 3 08/28/2015 at Unknown time  . mometasone-formoterol (DULERA) 100-5 MCG/ACT AERO Inhale 2 puffs into the lungs 2 (two) times daily.   08/29/2015 at Unknown time  . montelukast (SINGULAIR) 10 MG tablet Take 10 mg by mouth at bedtime.   08/28/2015 at Unknown time  . nicotine (NICODERM CQ - DOSED IN MG/24 HOURS) 14 mg/24hr patch Place 14 mg onto the skin daily.   08/28/2015 at Unknown time  . nitroGLYCERIN (NITROSTAT) 0.4 MG SL tablet Place 1 tablet (0.4 mg total) under the tongue every 5 (five) minutes as needed for chest pain. 25 tablet 4 unknown  . oxyCODONE-acetaminophen (PERCOCET) 10-325 MG tablet Take 1 tablet by mouth every 4 (four) hours as needed for pain.   08/28/2015 at Unknown time  . pantoprazole (PROTONIX) 40 MG tablet Take 1 tablet (40 mg total) by mouth 2 (two) times daily. 60 tablet 0 08/28/2015 at Unknown time  . potassium chloride SA (K-DUR,KLOR-CON) 20 MEQ tablet Take 20 mEq by mouth 2 (two) times daily.   08/28/2015 at Unknown time  . SPIRIVA RESPIMAT 2.5 MCG/ACT AERS Inhale 1 puff into the lungs daily.   08/28/2015 at Unknown time  . varenicline (CHANTIX) 0.5 MG tablet Take 1 tablet (0.5 mg total) by mouth 2 (two) times daily. 60 tablet 2 unknown  . edoxaban (SAVAYSA) 60 MG TABS tablet Take 60  mg by mouth daily. (Patient not taking: Reported on 08/29/2015) 30 tablet 11 08/17/2015 at Unknown time  . insulin aspart (NOVOLOG) 100 UNIT/ML injection Inject 0-20 Units into the skin 3 (three) times daily with meals. (Patient not taking: Reported on 08/29/2015) 10 mL 11 08/17/2015 at Unknown time  . nicotine (NICODERM CQ - DOSED IN MG/24 HOURS) 21 mg/24hr patch Place 1 patch (21 mg total) onto the skin daily. (Patient not taking: Reported on 08/17/2015) 28 patch  0 Taking  . PHARMACIST CHOICE LANCETS MISC USE 2 TIMES DAILY FOR DIABETIC TESTING (Patient not taking: Reported on 08/29/2015) 200 each 0   . predniSONE (DELTASONE) 10 MG tablet Take 4 tablets (40 mg total) by mouth daily with breakfast. (Patient not taking: Reported on 08/17/2015) 30 tablet 1 Completed Course at Unknown time   Scheduled: . antiseptic oral rinse  7 mL Mouth Rinse BID  . azithromycin  500 mg Intravenous Q24H  . diltiazem  120 mg Oral Daily  . enoxaparin (LOVENOX) injection  40 mg Subcutaneous Q24H  . ethambutol  1,000 mg Oral Daily  . insulin aspart  0-20 Units Subcutaneous TID WC  . insulin aspart  0-5 Units Subcutaneous QHS  . insulin aspart  3 Units Subcutaneous TID WC  . levalbuterol  0.63 mg Nebulization Q8H  . metFORMIN  500 mg Oral Q breakfast  . nicotine  21 mg Transdermal Daily  . oxyCODONE-acetaminophen  1 tablet Oral Q4H   And  . oxyCODONE  5 mg Oral Q4H  . pantoprazole  40 mg Oral BID  . piperacillin-tazobactam (ZOSYN)  IV  3.375 g Intravenous Q8H  . rifampin  600 mg Oral Daily  . tiotropium  18 mcg Inhalation Daily  . vancomycin  750 mg Intravenous Q12H   Continuous: . sodium chloride 75 mL/hr at 08/31/15 1615   PQD:[id number], HYDROcodone-acetaminophen, LORazepam, oxyCODONE-acetaminophen  Assesment: He was admitted with abnormal x-ray probable cavitary pneumonia and sepsis. His sepsis has was soft. 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 Alternatively this could be a lung cancer and he is also high risk for that.  He has severe COPD at baseline. He also has some element of pulmonary fibrosis.  He has chronic hypoxic respiratory failure  He has atrial fibrillation but currently seems to be in sinus rhythm he is fully anticoagulated Principal Problem:   MAI (mycobacterium avium-intracellulare) (HCC) Active Problems:   TOBACCO ABUSE   SOB (shortness of breath)   Pulmonary cavitary  lesion   Chronic atrial fibrillation (HCC)   Pulmonary fibrosis (HCC)   Sepsis ([place])    Plan: Continue current treatments. Await sputum for AFB.    LOS: 3 days   Brookelle Pellicane L 09/01/2015, 10:58 AM

## 2015-09-02 LAB — GLUCOSE, CAPILLARY
GLUCOSE-CAPILLARY: 160 mg/dL — AB (ref 65–99)
GLUCOSE-CAPILLARY: 143 mg/dL — AB (ref 65–99)
GLUCOSE-CAPILLARY: 148 mg/dL — AB (ref 65–99)
Glucose-Capillary: 116 mg/dL — ABNORMAL HIGH (ref 65–99)

## 2015-09-02 NOTE — Discharge Summary (Signed)
NAMEADLEY, MAZUROWSKI                 ACCOUNT NO.:  [id number]  MEDICAL RECORD NO.:  [id number]  LOCATION:  A341                          FACILITY:  [facility]  PHYSICIAN:  Jourdyn Ferrin L. Luan Pulling, M.D.DATE OF BIRTH:  [birth year]  DATE OF ADMISSION:  08/29/2015 DATE OF DISCHARGE:  02/11/2017LH                              DISCHARGE SUMMARY   ADDENDUM  DISCHARGE DIAGNOSIS:  Healthcare-associated pneumonia.     Kahlia Lagunes L. Luan Pulling, M.D.     EL[facility]/MEDQ  D:  09/02/2015  T:  09/02/2015  Job:  [id number]

## 2015-09-02 NOTE — Progress Notes (Signed)
Subjective: He says he feels better. He has no new complaints. His breathing is better. He still coughing up sputum but less  Objective: Vital signs in last 24 hours: Temp:  [98.2 F (36.8 C)-98.8 F (37.1 C)] 98.2 F (36.8 C) (02/19 0509) Pulse Rate:  [74-95] 74 (02/19 0509) Resp:  [20] 20 (02/19 0509) BP: (129-131)/(68-73) 129/68 mmHg (02/19 0509) SpO2:  [95 %-99 %] 99 % (02/19 0833) Weight change:  Last BM Date: 08/31/15  Intake/Output from previous day: 02/18 0701 - 02/19 0700 In: -  Out: 1300 [Urine:1300]  PHYSICAL EXAM General appearance: alert, cooperative and mild distress Resp: rhonchi bilaterally Cardio: regular rate and rhythm, S1, S2 normal, no murmur, click, rub or gallop GI: soft, non-tender; bowel sounds normal; no masses,  no organomegaly Extremities: He still has edema of the extremities but it looks better  Lab Results:  Results for orders placed or performed during the hospital encounter of 08/29/15 (from the past 48 hour(s))  Glucose, capillary     Status: Abnormal   Collection Time: 08/31/15 11:18 AM  Result Value Ref Range   Glucose-Capillary 208 (H) 65 - 99 mg/dL  Glucose, capillary     Status: Abnormal   Collection Time: 08/31/15  4:16 PM  Result Value Ref Range   Glucose-Capillary 112 (H) 65 - 99 mg/dL  Culture, respiratory (NON-Expectorated)     Status: None (Preliminary result)   Collection Time: 08/31/15  4:20 PM  Result Value Ref Range   Specimen Description SPUTUM EXPECTORATED    Special Requests NONE    Gram Stain      ABUNDANT WBC PRESENT,BOTH PMN AND MONONUCLEAR FEW SQUAMOUS EPITHELIAL CELLS PRESENT MODERATE GRAM POSITIVE COCCI IN PAIRS IN CLUSTERS FEW GRAM NEGATIVE RODS THIS SPECIMEN IS ACCEPTABLE FOR SPUTUM CULTURE Performed at [facility]    Culture PENDING    Report Status PENDING   Glucose, capillary     Status: Abnormal   Collection Time: 08/31/15  8:57 PM  Result Value Ref Range   Glucose-Capillary 153 (H) 65 -  99 mg/dL   Comment 1 Notify RN    Comment 2 Document in Chart   Vancomycin, trough     Status: None   Collection Time: 08/31/15 11:31 PM  Result Value Ref Range   Vancomycin Tr 17 10.0 - 20.0 ug/mL  Glucose, capillary     Status: Abnormal   Collection Time: 09/01/15  7:38 AM  Result Value Ref Range   Glucose-Capillary 146 (H) 65 - 99 mg/dL  Glucose, capillary     Status: Abnormal   Collection Time: 09/01/15 11:16 AM  Result Value Ref Range   Glucose-Capillary 158 (H) 65 - 99 mg/dL  Glucose, capillary     Status: Abnormal   Collection Time: 09/01/15  5:18 PM  Result Value Ref Range   Glucose-Capillary 130 (H) 65 - 99 mg/dL   Comment 1 Notify RN    Comment 2 Document in Chart   Glucose, capillary     Status: Abnormal   Collection Time: 09/01/15  8:17 PM  Result Value Ref Range   Glucose-Capillary 178 (H) 65 - 99 mg/dL   Comment 1 Notify RN    Comment 2 Document in Chart   Glucose, capillary     Status: Abnormal   Collection Time: 09/02/15  7:50 AM  Result Value Ref Range   Glucose-Capillary 160 (H) 65 - 99 mg/dL   Comment 1 Notify RN     ABGS No results for input(s): PHART, PO2ART,  TCO2, HCO3 in the last 72 hours.  Invalid input(s): PCO2 CULTURES Recent Results (from the past 240 hour(s))  Culture, blood (Routine X 2) w Reflex to ID Panel     Status: None (Preliminary result)   Collection Time: 08/29/15 12:10 PM  Result Value Ref Range Status   Specimen Description BLOOD RIGHT ANTECUBITAL  Final   Special Requests BOTTLES DRAWN AEROBIC AND ANAEROBIC 10CC  Final   Culture  Setup Time   Final    GRAM NEGATIVE RODS ANAEROBIC BOTTLE ONLY CRITICAL RESULT CALLED TO, READ BACK BY AND VERIFIED WITH: V DILDY,RN AT 1333 08/30/15 BY [name] BENFIELD    Culture   Final    PANTOEA SPECIES SENT TO REFERENCE LAB FOR SENSITIVITIES Performed at [facility]    Report Status PENDING  Incomplete  Culture, blood (Routine X 2) w Reflex to ID Panel     Status: None (Preliminary  result)   Collection Time: 08/29/15 12:20 PM  Result Value Ref Range Status   Specimen Description BLOOD RIGHT WRIST  Final   Special Requests BOTTLES DRAWN AEROBIC AND ANAEROBIC 6CC EACH  Final   Culture NO GROWTH 4 DAYS  Final   Report Status PENDING  Incomplete  Culture, respiratory (NON-Expectorated)     Status: None (Preliminary result)   Collection Time: 08/31/15  4:20 PM  Result Value Ref Range Status   Specimen Description SPUTUM EXPECTORATED  Final   Special Requests NONE  Final   Gram Stain   Final    ABUNDANT WBC PRESENT,BOTH PMN AND MONONUCLEAR FEW SQUAMOUS EPITHELIAL CELLS PRESENT MODERATE GRAM POSITIVE COCCI IN PAIRS IN CLUSTERS FEW GRAM NEGATIVE RODS THIS SPECIMEN IS ACCEPTABLE FOR SPUTUM CULTURE Performed at [facility]    Culture PENDING  Incomplete   Report Status PENDING  Incomplete   Studies/Results: No results found.  Medications:  Prior to Admission:  Prescriptions prior to admission  Medication Sig Dispense Refill Last Dose  . albuterol (VENTOLIN HFA) 108 (90 BASE) MCG/ACT inhaler Inhale 2 puffs into the lungs every 6 (six) hours as needed for wheezing or shortness of breath.   08/29/2015 at Unknown time  . baclofen (LIORESAL) 10 MG tablet Take 10 mg by mouth 3 (three) times daily.    08/28/2015 at Unknown time  . benzonatate (TESSALON) 200 MG capsule Take 200 mg by mouth 3 (three) times daily.   08/28/2015 at Unknown time  . Dextromethorphan-Guaifenesin 20-400 MG TABS Take 1 tablet by mouth every morning.    08/28/2015 at Unknown time  . diltiazem (CARDIZEM CD) 120 MG 24 hr capsule Take 1 capsule (120 mg total) by mouth daily. 30 capsule 11 08/28/2015 at Unknown time  . diphenhydrAMINE (BENADRYL) 25 MG tablet Take 50 mg by mouth every morning.   08/28/2015 at Unknown time  . fluticasone (FLONASE) 50 MCG/ACT nasal spray Place 2 sprays into both nostrils daily as needed for allergies.    unknown  . furosemide (LASIX) 40 MG tablet Take 40 mg by mouth 2  (two) times daily.   08/28/2015 at Unknown time  . insulin glargine (LANTUS) 100 UNIT/ML injection Inject 0.35 mLs (35 Units total) into the skin daily. 10 mL 11 08/28/2015 at Unknown time  . insulin lispro (HUMALOG) 100 UNIT/ML injection Inject into the skin 3 (three) times daily before meals. Per sliding scale   08/28/2015 at Unknown time  . levalbuterol (XOPENEX) 0.63 MG/3ML nebulizer solution Take 3 mLs (0.63 mg total) by nebulization 3 (three) times daily. (Patient taking differently:  Take 0.63 mg by nebulization every 8 (eight) hours as needed for wheezing or shortness of breath. ) 3 mL 12 08/28/2015 at Unknown time  . LORazepam (ATIVAN) 0.5 MG tablet Take 1 tablet (0.5 mg total) by mouth 2 (two) times daily as needed for anxiety or sleep. (Patient taking differently: Take 0.5 mg by mouth 4 (four) times daily as needed for anxiety or sleep. ) 20 tablet 0 08/28/2015 at Unknown time  . Magnesium 500 MG TABS Take 500 mg by mouth daily.   08/28/2015 at Unknown time  . metFORMIN (GLUCOPHAGE) 500 MG tablet Take 1 tablet (500 mg total) by mouth daily with breakfast. 30 tablet 3 08/28/2015 at Unknown time  . mometasone-formoterol (DULERA) 100-5 MCG/ACT AERO Inhale 2 puffs into the lungs 2 (two) times daily.   08/29/2015 at Unknown time  . montelukast (SINGULAIR) 10 MG tablet Take 10 mg by mouth at bedtime.   08/28/2015 at Unknown time  . nicotine (NICODERM CQ - DOSED IN MG/24 HOURS) 14 mg/24hr patch Place 14 mg onto the skin daily.   08/28/2015 at Unknown time  . nitroGLYCERIN (NITROSTAT) 0.4 MG SL tablet Place 1 tablet (0.4 mg total) under the tongue every 5 (five) minutes as needed for chest pain. 25 tablet 4 unknown  . oxyCODONE-acetaminophen (PERCOCET) 10-325 MG tablet Take 1 tablet by mouth every 4 (four) hours as needed for pain.   08/28/2015 at Unknown time  . pantoprazole (PROTONIX) 40 MG tablet Take 1 tablet (40 mg total) by mouth 2 (two) times daily. 60 tablet 0 08/28/2015 at Unknown time  . potassium  chloride SA (K-DUR,KLOR-CON) 20 MEQ tablet Take 20 mEq by mouth 2 (two) times daily.   08/28/2015 at Unknown time  . SPIRIVA RESPIMAT 2.5 MCG/ACT AERS Inhale 1 puff into the lungs daily.   08/28/2015 at Unknown time  . varenicline (CHANTIX) 0.5 MG tablet Take 1 tablet (0.5 mg total) by mouth 2 (two) times daily. 60 tablet 2 unknown  . edoxaban (SAVAYSA) 60 MG TABS tablet Take 60 mg by mouth daily. (Patient not taking: Reported on 08/29/2015) 30 tablet 11 08/17/2015 at Unknown time  . insulin aspart (NOVOLOG) 100 UNIT/ML injection Inject 0-20 Units into the skin 3 (three) times daily with meals. (Patient not taking: Reported on 08/29/2015) 10 mL 11 08/17/2015 at Unknown time  . nicotine (NICODERM CQ - DOSED IN MG/24 HOURS) 21 mg/24hr patch Place 1 patch (21 mg total) onto the skin daily. (Patient not taking: Reported on 08/17/2015) 28 patch 0 Taking  . PHARMACIST CHOICE LANCETS MISC USE 2 TIMES DAILY FOR DIABETIC TESTING (Patient not taking: Reported on 08/29/2015) 200 each 0   . predniSONE (DELTASONE) 10 MG tablet Take 4 tablets (40 mg total) by mouth daily with breakfast. (Patient not taking: Reported on 08/17/2015) 30 tablet 1 Completed Course at Unknown time   Scheduled: . antiseptic oral rinse  7 mL Mouth Rinse BID  . azithromycin  500 mg Intravenous Q24H  . diltiazem  120 mg Oral Daily  . enoxaparin (LOVENOX) injection  40 mg Subcutaneous Q24H  . ethambutol  1,000 mg Oral Daily  . insulin aspart  0-20 Units Subcutaneous TID WC  . insulin aspart  0-5 Units Subcutaneous QHS  . insulin aspart  3 Units Subcutaneous TID WC  . levalbuterol  0.63 mg Nebulization Q8H  . metFORMIN  500 mg Oral Q breakfast  . nicotine  21 mg Transdermal Daily  . oxyCODONE-acetaminophen  1 tablet Oral Q4H   And  .  oxyCODONE  5 mg Oral Q4H  . pantoprazole  40 mg Oral BID  . piperacillin-tazobactam (ZOSYN)  IV  3.375 g Intravenous Q8H  . rifampin  600 mg Oral Daily  . tiotropium  18 mcg Inhalation Daily  . vancomycin  750 mg  Intravenous Q12H   Continuous: . sodium chloride 75 mL/hr at 09/02/15 0804   RCB:ULAGTXMIWOEHOZY, HYDROcodone-acetaminophen, LORazepam, oxyCODONE-acetaminophen  Assesment: He was admitted with acute on chronic respiratory failure. This is multifactorial and includes pneumonia COPD exacerbation a pulmonary cavitary lesion pulmonary fibrosis. He has had mycobacterium avium intracellular area in the past and is being checked for that. He was septic on admission and that has improved. 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 Principal Problem:   MAI (mycobacterium avium-intracellulare) (HCC) Active Problems:   TOBACCO ABUSE   SOB (shortness of breath)   Pulmonary cavitary lesion   COPD exacerbation (HCC)   Chronic atrial fibrillation (HCC)   Pulmonary fibrosis (HCC)   Sepsis (HCC)    Plan: Apparently he's not had any AFBs collected. I have requested that. The fact that he is improving on treatment this quickly makes me feel that his current problem is not primarily mycobacterium avium intracellulare disease.    LOS: 4 days   Ryan [name] 09/02/2015, 10:36 AM

## 2015-09-03 LAB — CBC WITH DIFFERENTIAL/PLATELET
Basophils Absolute: 0 10*3/uL (ref 0.0–0.1)
Basophils Relative: 0 %
Eosinophils Absolute: 0.6 10*3/uL (ref 0.0–0.7)
Eosinophils Relative: 6 %
HEMATOCRIT: 24.6 % — AB (ref 39.0–52.0)
HEMOGLOBIN: 7.6 g/dL — AB (ref 13.0–17.0)
LYMPHS ABS: 1.7 10*3/uL (ref 0.7–4.0)
LYMPHS PCT: 19 %
MCH: 24.2 pg — ABNORMAL LOW (ref 26.0–34.0)
MCHC: 30.9 g/dL (ref 30.0–36.0)
MCV: 78.3 fL (ref 78.0–100.0)
Monocytes Absolute: 1.1 10*3/uL — ABNORMAL HIGH (ref 0.1–1.0)
Monocytes Relative: 12 %
NEUTROS ABS: 5.9 10*3/uL (ref 1.7–7.7)
NEUTROS PCT: 63 %
PLATELETS: 204 10*3/uL (ref 150–400)
RBC: 3.14 MIL/uL — AB (ref 4.22–5.81)
RDW: 23.2 % — ABNORMAL HIGH (ref 11.5–15.5)
WBC: 9.3 10*3/uL (ref 4.0–10.5)

## 2015-09-03 LAB — CULTURE, BLOOD (ROUTINE X 2): CULTURE: NO GROWTH

## 2015-09-03 LAB — BASIC METABOLIC PANEL
ANION GAP: 7 (ref 5–15)
BUN: 6 mg/dL (ref 6–20)
CHLORIDE: 104 mmol/L (ref 101–111)
CO2: 29 mmol/L (ref 22–32)
Calcium: 7.9 mg/dL — ABNORMAL LOW (ref 8.9–10.3)
Creatinine, Ser: 1.17 mg/dL (ref 0.61–1.24)
GFR calc Af Amer: >60 mL/min (ref >60)
GLUCOSE: 146 mg/dL — AB (ref 65–99)
POTASSIUM: 3.7 mmol/L (ref 3.5–5.1)
Sodium: 140 mmol/L (ref 135–145)

## 2015-09-03 LAB — GLUCOSE, CAPILLARY
GLUCOSE-CAPILLARY: 103 mg/dL — AB (ref 65–99)
Glucose-Capillary: 130 mg/dL — ABNORMAL HIGH (ref 65–99)
Glucose-Capillary: 114 mg/dL — ABNORMAL HIGH (ref 65–99)
Glucose-Capillary: 104 mg/dL — ABNORMAL HIGH (ref 65–99)

## 2015-09-03 LAB — CULTURE, RESPIRATORY: CULTURE: NORMAL

## 2015-09-03 LAB — CULTURE, RESPIRATORY W GRAM STAIN

## 2015-09-03 NOTE — Progress Notes (Signed)
Subjective: He says he feels better. He has no new complaints. His breathing is doing better. He is still coughing up sputum.  Objective: Vital signs in last 24 hours: Temp:  [98.2 F (36.8 C)-98.5 F (36.9 C)] 98.5 F (36.9 C) (02/20 0627) Pulse Rate:  [78-81] 78 (02/20 0627) Resp:  [20] 20 (02/20 0627) BP: (128-132)/(64-69) 128/64 mmHg (02/20 0627) SpO2:  [96 %-100 %] 100 % (02/20 0627) Weight change:  Last BM Date: 08/31/15  Intake/Output from previous day: 02/19 0701 - 02/20 0700 In: 3003.8 [P.O.:480; I.V.:1623.8; IV Piggyback:900] Out: 2900 [Urine:2900]  PHYSICAL EXAM General appearance: alert, cooperative and mild distress Resp: rhonchi bilaterally Cardio: regular rate and rhythm, S1, S2 normal, no murmur, click, rub or gallop GI: soft, non-tender; bowel sounds normal; no masses,  no organomegaly Extremities: He still has edema of the extremities  Lab Results:  Results for orders placed or performed during the hospital encounter of 08/29/15 (from the past 48 hour(s))  Glucose, capillary     Status: Abnormal   Collection Time: 09/01/15 11:16 AM  Result Value Ref Range   Glucose-Capillary 158 (H) 65 - 99 mg/dL  Glucose, capillary     Status: Abnormal   Collection Time: 09/01/15  5:18 PM  Result Value Ref Range   Glucose-Capillary 130 (H) 65 - 99 mg/dL   Comment 1 Notify RN    Comment 2 Document in Chart   Glucose, capillary     Status: Abnormal   Collection Time: 09/01/15  8:17 PM  Result Value Ref Range   Glucose-Capillary 178 (H) 65 - 99 mg/dL   Comment 1 Notify RN    Comment 2 Document in Chart   Glucose, capillary     Status: Abnormal   Collection Time: 09/02/15  7:50 AM  Result Value Ref Range   Glucose-Capillary 160 (H) 65 - 99 mg/dL   Comment 1 Notify RN   Glucose, capillary     Status: Abnormal   Collection Time: 09/02/15 11:37 AM  Result Value Ref Range   Glucose-Capillary 143 (H) 65 - 99 mg/dL  Glucose, capillary     Status: Abnormal   Collection  Time: 09/02/15  5:19 PM  Result Value Ref Range   Glucose-Capillary 148 (H) 65 - 99 mg/dL   Comment 1 Notify RN   Glucose, capillary     Status: Abnormal   Collection Time: 09/02/15  8:18 PM  Result Value Ref Range   Glucose-Capillary 116 (H) 65 - 99 mg/dL   Comment 1 Notify RN    Comment 2 Document in Chart   CBC with Differential/Platelet     Status: Abnormal   Collection Time: 09/03/15  6:55 AM  Result Value Ref Range   WBC 9.3 4.0 - 10.5 K/uL   RBC 3.14 (L) 4.22 - 5.81 MIL/uL   Hemoglobin 7.6 (L) 13.0 - 17.0 g/dL   HCT 24.6 (L) 39.0 - 52.0 %   MCV 78.3 78.0 - 100.0 fL   MCH 24.2 (L) 26.0 - 34.0 pg   MCHC 30.9 30.0 - 36.0 g/dL   RDW 23.2 (H) 11.5 - 15.5 %   Platelets 204 150 - 400 K/uL   Neutrophils Relative % 63 %   Neutro Abs 5.9 1.7 - 7.7 K/uL   Lymphocytes Relative 19 %   Lymphs Abs 1.7 0.7 - 4.0 K/uL   Monocytes Relative 12 %   Monocytes Absolute 1.1 (H) 0.1 - 1.0 K/uL   Eosinophils Relative 6 %   Eosinophils Absolute 0.6 0.0 -  0.7 K/uL   Basophils Relative 0 %   Basophils Absolute 0.0 0.0 - 0.1 K/uL   WBC Morphology ATYPICAL LYMPHOCYTES   Basic metabolic panel     Status: Abnormal   Collection Time: 09/03/15  6:55 AM  Result Value Ref Range   Sodium 140 135 - 145 mmol/L   Potassium 3.7 3.5 - 5.1 mmol/L   Chloride 104 101 - 111 mmol/L   CO2 29 22 - 32 mmol/L   Glucose, Bld 146 (H) 65 - 99 mg/dL   BUN 6 6 - 20 mg/dL   Creatinine, Ser 1.17 0.61 - 1.24 mg/dL   Calcium 7.9 (L) 8.9 - 10.3 mg/dL   GFR calc non Af Amer >60 >60 mL/min   GFR calc Af Amer >60 >60 mL/min    Comment: (NOTE) The eGFR has been calculated using the CKD EPI equation. This calculation has not been validated in all clinical situations. eGFR's persistently <60 mL/min signify possible Chronic Kidney Disease.    Anion gap 7 5 - 15    ABGS No results for input(s): PHART, PO2ART, TCO2, HCO3 in the last 72 hours.  Invalid input(s): PCO2 CULTURES Recent Results (from the past 240 hour(s))   Culture, blood (Routine X 2) w Reflex to ID Panel     Status: None (Preliminary result)   Collection Time: 08/29/15 12:10 PM  Result Value Ref Range Status   Specimen Description BLOOD RIGHT ANTECUBITAL  Final   Special Requests BOTTLES DRAWN AEROBIC AND ANAEROBIC 10CC  Final   Culture  Setup Time   Final    GRAM NEGATIVE RODS ANAEROBIC BOTTLE ONLY CRITICAL RESULT CALLED TO, READ BACK BY AND VERIFIED WITH: V DILDY,RN AT 1333 08/30/15 BY L BENFIELD    Culture   Final    PANTOEA SPECIES SENT TO REFERENCE LAB FOR SENSITIVITIES Performed at [facility]    Report Status PENDING  Incomplete  Culture, blood (Routine X 2) w Reflex to ID Panel     Status: None (Preliminary result)   Collection Time: 08/29/15 12:20 PM  Result Value Ref Range Status   Specimen Description BLOOD RIGHT WRIST  Final   Special Requests BOTTLES DRAWN AEROBIC AND ANAEROBIC 6CC EACH  Final   Culture NO GROWTH 4 DAYS  Final   Report Status PENDING  Incomplete  Culture, respiratory (NON-Expectorated)     Status: None (Preliminary result)   Collection Time: 08/31/15  4:20 PM  Result Value Ref Range Status   Specimen Description SPUTUM EXPECTORATED  Final   Special Requests NONE  Final   Gram Stain   Final    ABUNDANT WBC PRESENT,BOTH PMN AND MONONUCLEAR FEW SQUAMOUS EPITHELIAL CELLS PRESENT MODERATE GRAM POSITIVE COCCI IN PAIRS IN CLUSTERS FEW GRAM NEGATIVE RODS THIS SPECIMEN IS ACCEPTABLE FOR SPUTUM CULTURE Performed at [facility]    Culture   Final    Culture reincubated for better growth Performed at [facility]    Report Status PENDING  Incomplete   Studies/Results: No results found.  Medications:  Prior to Admission:  Prescriptions prior to admission  Medication Sig Dispense Refill Last Dose  . albuterol (VENTOLIN HFA) 108 (90 BASE) MCG/ACT inhaler Inhale 2 puffs into the lungs every 6 (six) hours as needed for wheezing or shortness of breath.   08/29/2015 at Unknown time   . baclofen (LIORESAL) 10 MG tablet Take 10 mg by mouth 3 (three) times daily.    08/28/2015 at Unknown time  . benzonatate (TESSALON) 200 MG capsule  Take 200 mg by mouth 3 (three) times daily.   08/28/2015 at Unknown time  . Dextromethorphan-Guaifenesin 20-400 MG TABS Take 1 tablet by mouth every morning.    08/28/2015 at Unknown time  . diltiazem (CARDIZEM CD) 120 MG 24 hr capsule Take 1 capsule (120 mg total) by mouth daily. 30 capsule 11 08/28/2015 at Unknown time  . diphenhydrAMINE (BENADRYL) 25 MG tablet Take 50 mg by mouth every morning.   08/28/2015 at Unknown time  . fluticasone (FLONASE) 50 MCG/ACT nasal spray Place 2 sprays into both nostrils daily as needed for allergies.    unknown  . furosemide (LASIX) 40 MG tablet Take 40 mg by mouth 2 (two) times daily.   08/28/2015 at Unknown time  . insulin glargine (LANTUS) 100 UNIT/ML injection Inject 0.35 mLs (35 Units total) into the skin daily. 10 mL 11 08/28/2015 at Unknown time  . insulin lispro (HUMALOG) 100 UNIT/ML injection Inject into the skin 3 (three) times daily before meals. Per sliding scale   08/28/2015 at Unknown time  . levalbuterol (XOPENEX) 0.63 MG/3ML nebulizer solution Take 3 mLs (0.63 mg total) by nebulization 3 (three) times daily. (Patient taking differently: Take 0.63 mg by nebulization every 8 (eight) hours as needed for wheezing or shortness of breath. ) 3 mL 12 08/28/2015 at Unknown time  . LORazepam (ATIVAN) 0.5 MG tablet Take 1 tablet (0.5 mg total) by mouth 2 (two) times daily as needed for anxiety or sleep. (Patient taking differently: Take 0.5 mg by mouth 4 (four) times daily as needed for anxiety or sleep. ) 20 tablet 0 08/28/2015 at Unknown time  . Magnesium 500 MG TABS Take 500 mg by mouth daily.   08/28/2015 at Unknown time  . metFORMIN (GLUCOPHAGE) 500 MG tablet Take 1 tablet (500 mg total) by mouth daily with breakfast. 30 tablet 3 08/28/2015 at Unknown time  . mometasone-formoterol (DULERA) 100-5 MCG/ACT AERO Inhale 2  puffs into the lungs 2 (two) times daily.   08/29/2015 at Unknown time  . montelukast (SINGULAIR) 10 MG tablet Take 10 mg by mouth at bedtime.   08/28/2015 at Unknown time  . nicotine (NICODERM CQ - DOSED IN MG/24 HOURS) 14 mg/24hr patch Place 14 mg onto the skin daily.   08/28/2015 at Unknown time  . nitroGLYCERIN (NITROSTAT) 0.4 MG SL tablet Place 1 tablet (0.4 mg total) under the tongue every 5 (five) minutes as needed for chest pain. 25 tablet 4 unknown  . oxyCODONE-acetaminophen (PERCOCET) 10-325 MG tablet Take 1 tablet by mouth every 4 (four) hours as needed for pain.   08/28/2015 at Unknown time  . pantoprazole (PROTONIX) 40 MG tablet Take 1 tablet (40 mg total) by mouth 2 (two) times daily. 60 tablet 0 08/28/2015 at Unknown time  . potassium chloride SA (K-DUR,KLOR-CON) 20 MEQ tablet Take 20 mEq by mouth 2 (two) times daily.   08/28/2015 at Unknown time  . SPIRIVA RESPIMAT 2.5 MCG/ACT AERS Inhale 1 puff into the lungs daily.   08/28/2015 at Unknown time  . varenicline (CHANTIX) 0.5 MG tablet Take 1 tablet (0.5 mg total) by mouth 2 (two) times daily. 60 tablet 2 unknown  . edoxaban (SAVAYSA) 60 MG TABS tablet Take 60 mg by mouth daily. (Patient not taking: Reported on 08/29/2015) 30 tablet 11 08/17/2015 at Unknown time  . insulin aspart (NOVOLOG) 100 UNIT/ML injection Inject 0-20 Units into the skin 3 (three) times daily with meals. (Patient not taking: Reported on 08/29/2015) 10 mL 11 08/17/2015 at Unknown time  .  nicotine (NICODERM CQ - DOSED IN MG/24 HOURS) 21 mg/24hr patch Place 1 patch (21 mg total) onto the skin daily. (Patient not taking: Reported on 08/17/2015) 28 patch 0 Taking  . PHARMACIST CHOICE LANCETS MISC USE 2 TIMES DAILY FOR DIABETIC TESTING (Patient not taking: Reported on 08/29/2015) 200 each 0   . predniSONE (DELTASONE) 10 MG tablet Take 4 tablets (40 mg total) by mouth daily with breakfast. (Patient not taking: Reported on 08/17/2015) 30 tablet 1 Completed Course at Unknown time    Scheduled: . antiseptic oral rinse  7 mL Mouth Rinse BID  . azithromycin  500 mg Intravenous Q24H  . diltiazem  120 mg Oral Daily  . enoxaparin (LOVENOX) injection  40 mg Subcutaneous Q24H  . ethambutol  1,000 mg Oral Daily  . insulin aspart  0-20 Units Subcutaneous TID WC  . insulin aspart  0-5 Units Subcutaneous QHS  . insulin aspart  3 Units Subcutaneous TID WC  . levalbuterol  0.63 mg Nebulization Q8H  . metFORMIN  500 mg Oral Q breakfast  . nicotine  21 mg Transdermal Daily  . oxyCODONE-acetaminophen  1 tablet Oral Q4H   And  . oxyCODONE  5 mg Oral Q4H  . pantoprazole  40 mg Oral BID  . piperacillin-tazobactam (ZOSYN)  IV  3.375 g Intravenous Q8H  . rifampin  600 mg Oral Daily  . tiotropium  18 mcg Inhalation Daily  . vancomycin  750 mg Intravenous Q12H   Continuous: . sodium chloride 75 mL/hr at 09/02/15 1800   GTX:[id number], HYDROcodone-acetaminophen, LORazepam, oxyCODONE-acetaminophen  Assesment: He was admitted with acute on chronic respiratory failure. He appeared to be septic. I think this is more likely pneumonia rather than atypical mycobacterial infection considering that he is improved as much as he has as quickly as he has. However he does need to have sputum collected. He may also have a malignancy in his lung based on the CT. Overall he is improving. He has chronic atrial fibrillation but seems to be in sinus rhythm right now. He is chronically anticoagulated Principal Problem:   MAI (mycobacterium avium-intracellulare) (HCC) Active Problems:   TOBACCO ABUSE   SOB (shortness of breath)   Pulmonary cavitary lesion   COPD exacerbation (HCC)   Chronic atrial fibrillation (HCC)   Pulmonary fibrosis (HCC)   Sepsis ([place])    Plan: Continue with current treatments. No medication changes. Continue to collect sputum for AFB.    LOS: 5 days   Bowdy Bair L 09/03/2015, 8:40 AM

## 2015-09-04 LAB — CULTURE, BLOOD (ROUTINE X 2)

## 2015-09-04 LAB — GLUCOSE, CAPILLARY
GLUCOSE-CAPILLARY: 133 mg/dL — AB (ref 65–99)
GLUCOSE-CAPILLARY: 138 mg/dL — AB (ref 65–99)
GLUCOSE-CAPILLARY: 88 mg/dL (ref 65–99)
Glucose-Capillary: 138 mg/dL — ABNORMAL HIGH (ref 65–99)

## 2015-09-04 NOTE — Progress Notes (Signed)
Subjective: He says he feels better. He is still coughing up sputum. He has now obtained 2 AFB smears. I don't think it's likely that this is Mycobacterium avium disease  Objective: Vital signs in last 24 hours: Temp:  [97.9 F (36.6 C)-98.3 F (36.8 C)] 98.3 F (36.8 C) (02/21 0659) Pulse Rate:  [80-86] 80 (02/21 0659) Resp:  [19-20] 20 (02/21 0659) BP: (126-134)/(70-76) 127/70 mmHg (02/21 0659) SpO2:  [97 %-99 %] 99 % (02/21 0758) FiO2 (%):  [40 %] 40 % (02/21 0758) Weight change:  Last BM Date: 08/31/15  Intake/Output from previous day: 02/20 0701 - 02/21 0700 In: 1405 [P.O.:480; I.V.:725; IV Piggyback:200] Out: 2425 [Urine:2425]  PHYSICAL EXAM General appearance: alert, cooperative and no distress Resp: rhonchi bilaterally Cardio: regular rate and rhythm, S1, S2 normal, no murmur, click, rub or gallop GI: soft, non-tender; bowel sounds normal; no masses,  no organomegaly Extremities: Trace to 1+ edema  Lab Results:  Results for orders placed or performed during the hospital encounter of 08/29/15 (from the past 48 hour(s))  Glucose, capillary     Status: Abnormal   Collection Time: 09/02/15 11:37 AM  Result Value Ref Range   Glucose-Capillary 143 (H) 65 - 99 mg/dL  Glucose, capillary     Status: Abnormal   Collection Time: 09/02/15  5:19 PM  Result Value Ref Range   Glucose-Capillary 148 (H) 65 - 99 mg/dL   Comment 1 Notify RN   Glucose, capillary     Status: Abnormal   Collection Time: 09/02/15  8:18 PM  Result Value Ref Range   Glucose-Capillary 116 (H) 65 - 99 mg/dL   Comment 1 Notify RN    Comment 2 Document in Chart   CBC with Differential/Platelet     Status: Abnormal   Collection Time: 09/03/15  6:55 AM  Result Value Ref Range   WBC 9.3 4.0 - 10.5 K/uL   RBC 3.14 (L) 4.22 - 5.81 MIL/uL   Hemoglobin 7.6 (L) 13.0 - 17.0 g/dL   HCT 24.6 (L) 39.0 - 52.0 %   MCV 78.3 78.0 - 100.0 fL   MCH 24.2 (L) 26.0 - 34.0 pg   MCHC 30.9 30.0 - 36.0 g/dL   RDW 23.2 (H)  11.5 - 15.5 %   Platelets 204 150 - 400 K/uL   Neutrophils Relative % 63 %   Neutro Abs 5.9 1.7 - 7.7 K/uL   Lymphocytes Relative 19 %   Lymphs Abs 1.7 0.7 - 4.0 K/uL   Monocytes Relative 12 %   Monocytes Absolute 1.1 (H) 0.1 - 1.0 K/uL   Eosinophils Relative 6 %   Eosinophils Absolute 0.6 0.0 - 0.7 K/uL   Basophils Relative 0 %   Basophils Absolute 0.0 0.0 - 0.1 K/uL   WBC Morphology ATYPICAL LYMPHOCYTES   Basic metabolic panel     Status: Abnormal   Collection Time: 09/03/15  6:55 AM  Result Value Ref Range   Sodium 140 135 - 145 mmol/L   Potassium 3.7 3.5 - 5.1 mmol/L   Chloride 104 101 - 111 mmol/L   CO2 29 22 - 32 mmol/L   Glucose, Bld 146 (H) 65 - 99 mg/dL   BUN 6 6 - 20 mg/dL   Creatinine, Ser 1.17 0.61 - 1.24 mg/dL   Calcium 7.9 (L) 8.9 - 10.3 mg/dL   GFR calc non Af Amer >60 >60 mL/min   GFR calc Af Amer >60 >60 mL/min    Comment: (NOTE) The eGFR has been calculated using  the CKD EPI equation. This calculation has not been validated in all clinical situations. eGFR's persistently <60 mL/min signify possible Chronic Kidney Disease.    Anion gap 7 5 - 15  Glucose, capillary     Status: Abnormal   Collection Time: 09/03/15  9:17 AM  Result Value Ref Range   Glucose-Capillary 130 (H) 65 - 99 mg/dL  Glucose, capillary     Status: Abnormal   Collection Time: 09/03/15 12:43 PM  Result Value Ref Range   Glucose-Capillary 114 (H) 65 - 99 mg/dL   Comment 1 Notify RN   Glucose, capillary     Status: Abnormal   Collection Time: 09/03/15  5:29 PM  Result Value Ref Range   Glucose-Capillary 103 (H) 65 - 99 mg/dL  Glucose, capillary     Status: Abnormal   Collection Time: 09/03/15  9:29 PM  Result Value Ref Range   Glucose-Capillary 104 (H) 65 - 99 mg/dL  Glucose, capillary     Status: Abnormal   Collection Time: 09/04/15  8:28 AM  Result Value Ref Range   Glucose-Capillary 133 (H) 65 - 99 mg/dL   Comment 1 Notify RN     ABGS No results for input(s): PHART, PO2ART,  TCO2, HCO3 in the last 72 hours.  Invalid input(s): PCO2 CULTURES Recent Results (from the past 240 hour(s))  Culture, blood (Routine X 2) w Reflex to ID Panel     Status: None (Preliminary result)   Collection Time: 08/29/15 12:10 PM  Result Value Ref Range Status   Specimen Description BLOOD RIGHT ANTECUBITAL  Final   Special Requests BOTTLES DRAWN AEROBIC AND ANAEROBIC 10CC  Final   Culture  Setup Time   Final    GRAM NEGATIVE RODS ANAEROBIC BOTTLE ONLY CRITICAL RESULT CALLED TO, READ BACK BY AND VERIFIED WITH: V DILDY,RN AT 1333 08/30/15 BY L BENFIELD    Culture   Final    PANTOEA SPECIES SENT TO REFERENCE LAB FOR SENSITIVITIES Performed at [facility]    Report Status PENDING  Incomplete  Culture, blood (Routine X 2) w Reflex to ID Panel     Status: None   Collection Time: 08/29/15 12:20 PM  Result Value Ref Range Status   Specimen Description BLOOD RIGHT WRIST  Final   Special Requests BOTTLES DRAWN AEROBIC AND ANAEROBIC 6CC EACH  Final   Culture NO GROWTH 5 DAYS  Final   Report Status 09/03/2015 FINAL  Final  Culture, respiratory (NON-Expectorated)     Status: None   Collection Time: 08/31/15  4:20 PM  Result Value Ref Range Status   Specimen Description SPUTUM EXPECTORATED  Final   Special Requests NONE  Final   Gram Stain   Final    ABUNDANT WBC PRESENT,BOTH PMN AND MONONUCLEAR FEW SQUAMOUS EPITHELIAL CELLS PRESENT MODERATE GRAM POSITIVE COCCI IN PAIRS IN CLUSTERS FEW GRAM NEGATIVE RODS THIS SPECIMEN IS ACCEPTABLE FOR SPUTUM CULTURE Performed at [facility]    Culture   Final    NORMAL OROPHARYNGEAL FLORA Performed at [facility]    Report Status 09/03/2015 FINAL  Final   Studies/Results: No results found.  Medications:  Prior to Admission:  Prescriptions prior to admission  Medication Sig Dispense Refill Last Dose  . albuterol (VENTOLIN HFA) 108 (90 BASE) MCG/ACT inhaler Inhale 2 puffs into the lungs every 6 (six) hours as  needed for wheezing or shortness of breath.   08/29/2015 at Unknown time  . baclofen (LIORESAL) 10 MG tablet Take 10 mg by  mouth 3 (three) times daily.    08/28/2015 at Unknown time  . benzonatate (TESSALON) 200 MG capsule Take 200 mg by mouth 3 (three) times daily.   08/28/2015 at Unknown time  . Dextromethorphan-Guaifenesin 20-400 MG TABS Take 1 tablet by mouth every morning.    08/28/2015 at Unknown time  . diltiazem (CARDIZEM CD) 120 MG 24 hr capsule Take 1 capsule (120 mg total) by mouth daily. 30 capsule 11 08/28/2015 at Unknown time  . diphenhydrAMINE (BENADRYL) 25 MG tablet Take 50 mg by mouth every morning.   08/28/2015 at Unknown time  . fluticasone (FLONASE) 50 MCG/ACT nasal spray Place 2 sprays into both nostrils daily as needed for allergies.    unknown  . furosemide (LASIX) 40 MG tablet Take 40 mg by mouth 2 (two) times daily.   08/28/2015 at Unknown time  . insulin glargine (LANTUS) 100 UNIT/ML injection Inject 0.35 mLs (35 Units total) into the skin daily. 10 mL 11 08/28/2015 at Unknown time  . insulin lispro (HUMALOG) 100 UNIT/ML injection Inject into the skin 3 (three) times daily before meals. Per sliding scale   08/28/2015 at Unknown time  . levalbuterol (XOPENEX) 0.63 MG/3ML nebulizer solution Take 3 mLs (0.63 mg total) by nebulization 3 (three) times daily. (Patient taking differently: Take 0.63 mg by nebulization every 8 (eight) hours as needed for wheezing or shortness of breath. ) 3 mL 12 08/28/2015 at Unknown time  . LORazepam (ATIVAN) 0.5 MG tablet Take 1 tablet (0.5 mg total) by mouth 2 (two) times daily as needed for anxiety or sleep. (Patient taking differently: Take 0.5 mg by mouth 4 (four) times daily as needed for anxiety or sleep. ) 20 tablet 0 08/28/2015 at Unknown time  . Magnesium 500 MG TABS Take 500 mg by mouth daily.   08/28/2015 at Unknown time  . metFORMIN (GLUCOPHAGE) 500 MG tablet Take 1 tablet (500 mg total) by mouth daily with breakfast. 30 tablet 3 08/28/2015 at Unknown  time  . mometasone-formoterol (DULERA) 100-5 MCG/ACT AERO Inhale 2 puffs into the lungs 2 (two) times daily.   08/29/2015 at Unknown time  . montelukast (SINGULAIR) 10 MG tablet Take 10 mg by mouth at bedtime.   08/28/2015 at Unknown time  . nicotine (NICODERM CQ - DOSED IN MG/24 HOURS) 14 mg/24hr patch Place 14 mg onto the skin daily.   08/28/2015 at Unknown time  . nitroGLYCERIN (NITROSTAT) 0.4 MG SL tablet Place 1 tablet (0.4 mg total) under the tongue every 5 (five) minutes as needed for chest pain. 25 tablet 4 unknown  . oxyCODONE-acetaminophen (PERCOCET) 10-325 MG tablet Take 1 tablet by mouth every 4 (four) hours as needed for pain.   08/28/2015 at Unknown time  . pantoprazole (PROTONIX) 40 MG tablet Take 1 tablet (40 mg total) by mouth 2 (two) times daily. 60 tablet 0 08/28/2015 at Unknown time  . potassium chloride SA (K-DUR,KLOR-CON) 20 MEQ tablet Take 20 mEq by mouth 2 (two) times daily.   08/28/2015 at Unknown time  . SPIRIVA RESPIMAT 2.5 MCG/ACT AERS Inhale 1 puff into the lungs daily.   08/28/2015 at Unknown time  . varenicline (CHANTIX) 0.5 MG tablet Take 1 tablet (0.5 mg total) by mouth 2 (two) times daily. 60 tablet 2 unknown  . edoxaban (SAVAYSA) 60 MG TABS tablet Take 60 mg by mouth daily. (Patient not taking: Reported on 08/29/2015) 30 tablet 11 08/17/2015 at Unknown time  . insulin aspart (NOVOLOG) 100 UNIT/ML injection Inject 0-20 Units into the skin 3 (  three) times daily with meals. (Patient not taking: Reported on 08/29/2015) 10 mL 11 08/17/2015 at Unknown time  . nicotine (NICODERM CQ - DOSED IN MG/24 HOURS) 21 mg/24hr patch Place 1 patch (21 mg total) onto the skin daily. (Patient not taking: Reported on 08/17/2015) 28 patch 0 Taking  . PHARMACIST CHOICE LANCETS MISC USE 2 TIMES DAILY FOR DIABETIC TESTING (Patient not taking: Reported on 08/29/2015) 200 each 0   . predniSONE (DELTASONE) 10 MG tablet Take 4 tablets (40 mg total) by mouth daily with breakfast. (Patient not taking: Reported on  08/17/2015) 30 tablet 1 Completed Course at Unknown time   Scheduled: . antiseptic oral rinse  7 mL Mouth Rinse BID  . azithromycin  500 mg Intravenous Q24H  . diltiazem  120 mg Oral Daily  . enoxaparin (LOVENOX) injection  40 mg Subcutaneous Q24H  . ethambutol  1,000 mg Oral Daily  . insulin aspart  0-20 Units Subcutaneous TID WC  . insulin aspart  0-5 Units Subcutaneous QHS  . insulin aspart  3 Units Subcutaneous TID WC  . levalbuterol  0.63 mg Nebulization Q8H  . metFORMIN  500 mg Oral Q breakfast  . nicotine  21 mg Transdermal Daily  . oxyCODONE-acetaminophen  1 tablet Oral Q4H   And  . oxyCODONE  5 mg Oral Q4H  . pantoprazole  40 mg Oral BID  . piperacillin-tazobactam (ZOSYN)  IV  3.375 g Intravenous Q8H  . rifampin  600 mg Oral Daily  . tiotropium  18 mcg Inhalation Daily  . vancomycin  750 mg Intravenous Q12H   Continuous: . sodium chloride 75 mL/hr at 09/02/15 1800   YHC:[id number], HYDROcodone-acetaminophen, LORazepam, oxyCODONE-acetaminophen  Assesment: He was admitted with acute on chronic respiratory failure multifactorial. He is being treated for pneumonia and for Mac. Sputum's are being obtained. He has improved markedly. He was septic on admission and much better Principal Problem:   MAI (mycobacterium avium-intracellulare) ([place]) Active Problems:   TOBACCO ABUSE   SOB (shortness of breath)   Pulmonary cavitary lesion   COPD exacerbation (HCC)   Chronic atrial fibrillation (HCC)   Pulmonary fibrosis (HCC)   Sepsis ([place])    Plan: Obtain third sputum today probable discharge in the morning    LOS: 6 days   Aadyn Buchheit L 09/04/2015, 9:07 AM

## 2015-09-04 NOTE — Progress Notes (Signed)
Pharmacy Antibiotic Note  Ryan [name] is a 55 y.o. male admitted on 08/29/2015 with pneumonia.  Pharmacy has been consulted for vancomycin dosing. He just completed a course of vancomycin last week for MRSA bacteremia and had a recent admission for PNA   Plan: Continue vancomycin 750 mg IV q12 hours F/u renal function, cultures and clinical course   Height: '5\' 8"'$  (172.7 cm) Weight: 144 lb (65.318 kg) IBW/kg (Calculated) : 68.4  Temp (24hrs), Avg:98.1 F (36.7 C), Min:97.9 F (36.6 C), Max:98.3 F (36.8 C)   Recent Labs Lab 08/29/15 1025 08/29/15 1237 08/29/15 1546 08/31/15 0630 08/31/15 2331 09/03/15 0655  WBC 15.6*  --   --  8.7  --  9.3  CREATININE 1.75*  --   --  1.24  --  1.17  LATICACIDVEN  --  2.63* 0.86  --   --   --   VANCOTROUGH  --   --   --   --  17  --     Estimated Creatinine Clearance: 65.1 mL/min (by C-G formula based on Cr of 1.17).    Allergies  Allergen Reactions  . Albuterol Palpitations  . Influenza Vaccine Live Swelling    Thank you for allowing pharmacy to be a part of this patient's care.  Beverlee Nims 09/04/2015 12:49 PM

## 2015-09-04 NOTE — Progress Notes (Signed)
MD notified this RN to ensure that the patient is on the proper infectious disease precautions and verbalized that there was nothing else that needed to be done tonight regarding the positive AF[employer] smear.

## 2015-09-04 NOTE — Progress Notes (Signed)
Lab called this RN to report a positive [employer] smear on patient.  MD notified.  Waiting on response.

## 2015-09-05 ENCOUNTER — Encounter (HOSPITAL_COMMUNITY): Payer: Self-pay | Admitting: Internal Medicine

## 2015-09-05 LAB — GLUCOSE, CAPILLARY
Glucose-Capillary: 123 mg/dL — ABNORMAL HIGH (ref 65–99)
Glucose-Capillary: 118 mg/dL — ABNORMAL HIGH (ref 65–99)

## 2015-09-05 LAB — CREATININE, SERUM: Creatinine, Ser: 1.13 mg/dL (ref 0.61–1.24)

## 2015-09-05 MED ORDER — AMOXICILLIN-POT CLAVULANATE 875-125 MG PO TABS: 1.0000 | ORAL_TABLET | Freq: Two times a day (BID) | ORAL | Status: DC

## 2015-09-05 MED ORDER — ETHAMBUTOL HCL 400 MG PO TABS: 1000.0000 mg | ORAL_TABLET | Freq: Every day | ORAL | Status: DC

## 2015-09-05 MED ORDER — LEVALBUTEROL HCL 0.63 MG/3ML IN NEBU: 0.6300 mg | INHALATION_SOLUTION | Freq: Three times a day (TID) | RESPIRATORY_TRACT | Status: DC | PRN

## 2015-09-05 MED ORDER — RIFAMPIN 300 MG PO CAPS: 600.0000 mg | ORAL_CAPSULE | Freq: Every day | ORAL | Status: DC

## 2015-09-05 NOTE — Progress Notes (Signed)
He says he feels better and wants to go home. I think he is at maximum hospital benefit. Please see discharge summary for details

## 2015-09-05 NOTE — Progress Notes (Signed)
PICC line removed by Janett Billow, RN with tip intact.  Dressing in place.  Discharge ;instructions reviewed with patient and family, questions answered, understanding verbalized.  Home oxygen in room for discharge at [address] [place].  Discharged via wheelchair for discharge home in care of family and home health.  Stable at discharge.

## 2015-09-05 NOTE — Progress Notes (Signed)
MEDICATION RELATED CONSULT NOTE - DDI  Discussed discharged medications with Dr Luan Pulling.  Rifampin - Edoxaban drug interaction which decreases edoxaban levels and reduces AC effectiveness.  He stated will discuss alternatives with the patient and formulate a plan.  Pharmacy Dept.  Hart Robinsons A PharmD 09/05/2015,2:11 PM

## 2015-09-05 NOTE — Progress Notes (Signed)
Central telemetry notified of patient's discharge, telemetry removed.

## 2015-09-05 NOTE — Discharge Summary (Signed)
Physician Discharge Summary  Patient ID: Ryan [name] MRN: [id number] DOB/AGE: 09-18-1960 55 y.o. Primary Care Physician:Norah Fick L, MD Admit date: 08/29/2015 Discharge date: 09/05/2015    Discharge Diagnoses:   Principal Problem:   MAI (mycobacterium avium-intracellulare) ([place]) Active Problems:   TOBACCO ABUSE   SOB (shortness of breath)   Pulmonary cavitary lesion   COPD exacerbation (HCC)   Chronic atrial fibrillation (HCC)   Pulmonary fibrosis (HCC)   Sepsis (HCC)     Medication List    STOP taking these medications        predniSONE 10 MG tablet  Commonly known as:  DELTASONE      TAKE these medications        amoxicillin-clavulanate 875-125 MG tablet  Commonly known as:  AUGMENTIN  Take 1 tablet by mouth 2 (two) times daily.     baclofen 10 MG tablet  Commonly known as:  LIORESAL  Take 10 mg by mouth 3 (three) times daily.     benzonatate 200 MG capsule  Commonly known as:  TESSALON  Take 200 mg by mouth 3 (three) times daily.     Dextromethorphan-Guaifenesin 20-400 MG Tabs  Take 1 tablet by mouth every morning.     diltiazem 120 MG 24 hr capsule  Commonly known as:  CARDIZEM CD  Take 1 capsule (120 mg total) by mouth daily.     diphenhydrAMINE 25 MG tablet  Commonly known as:  BENADRYL  Take 50 mg by mouth every morning.     edoxaban 60 MG Tabs tablet  Commonly known as:  SAVAYSA  Take 60 mg by mouth daily.     ethambutol 400 MG tablet  Commonly known as:  MYAMBUTOL  Take 2.5 tablets (1,000 mg total) by mouth daily.     fluticasone 50 MCG/ACT nasal spray  Commonly known as:  FLONASE  Place 2 sprays into both nostrils daily as needed for allergies.     furosemide 40 MG tablet  Commonly known as:  LASIX  Take 40 mg by mouth 2 (two) times daily.     insulin aspart 100 UNIT/ML injection  Commonly known as:  novoLOG  Inject 0-20 Units into the skin 3 (three) times daily with meals.     insulin glargine 100 UNIT/ML injection   Commonly known as:  LANTUS  Inject 0.35 mLs (35 Units total) into the skin daily.     insulin lispro 100 UNIT/ML injection  Commonly known as:  HUMALOG  Inject into the skin 3 (three) times daily before meals. Per sliding scale     levalbuterol 0.63 MG/3ML nebulizer solution  Commonly known as:  XOPENEX  Take 3 mLs (0.63 mg total) by nebulization every 8 (eight) hours as needed for wheezing or shortness of breath.     LORazepam 0.5 MG tablet  Commonly known as:  ATIVAN  Take 1 tablet (0.5 mg total) by mouth 2 (two) times daily as needed for anxiety or sleep.     Magnesium 500 MG Tabs  Take 500 mg by mouth daily.     metFORMIN 500 MG tablet  Commonly known as:  GLUCOPHAGE  Take 1 tablet (500 mg total) by mouth daily with breakfast.     mometasone-formoterol 100-5 MCG/ACT Aero  Commonly known as:  DULERA  Inhale 2 puffs into the lungs 2 (two) times daily.     montelukast 10 MG tablet  Commonly known as:  SINGULAIR  Take 10 mg by mouth at bedtime.     nicotine 14  mg/24hr patch  Commonly known as:  NICODERM CQ - dosed in mg/24 hours  Place 14 mg onto the skin daily.     nitroGLYCERIN 0.4 MG SL tablet  Commonly known as:  NITROSTAT  Place 1 tablet (0.4 mg total) under the tongue every 5 (five) minutes as needed for chest pain.     oxyCODONE-acetaminophen 10-325 MG tablet  Commonly known as:  PERCOCET  Take 1 tablet by mouth every 4 (four) hours as needed for pain.     pantoprazole 40 MG tablet  Commonly known as:  PROTONIX  Take 1 tablet (40 mg total) by mouth 2 (two) times daily.     PHARMACIST CHOICE LANCETS Misc  USE 2 TIMES DAILY FOR DIABETIC TESTING     potassium chloride SA 20 MEQ tablet  Commonly known as:  K-DUR,KLOR-CON  Take 20 mEq by mouth 2 (two) times daily.     rifampin 300 MG capsule  Commonly known as:  RIFADIN  Take 2 capsules (600 mg total) by mouth daily.     varenicline 0.5 MG tablet  Commonly known as:  CHANTIX  Take 1 tablet (0.5 mg total)  by mouth 2 (two) times daily.     VENTOLIN HFA 108 (90 Base) MCG/ACT inhaler  Generic drug:  albuterol  Inhale 2 puffs into the lungs every 6 (six) hours as needed for wheezing or shortness of breath.      ASK your doctor about these medications        SPIRIVA RESPIMAT 2.5 MCG/ACT Aers  Generic drug:  Tiotropium Bromide Monohydrate  Inhale 1 puff into the lungs daily.        Discharged Condition: Improved    Consults: None  Significant Diagnostic Studies: Dg Chest 2 View  08/29/2015  CLINICAL DATA:  Hypoxia and fever EXAM: CHEST  2 VIEW COMPARISON:  08/17/2015, 05/01/2015 FINDINGS: Cardiac shadow is stable. Persistent changes are noted in the right lung base when compared with the prior exam. No sizable effusion is seen. Left lung demonstrates evidence of a cavitary appearing lesion in the left upper lobe. This was not as well appreciated on the recent exam due to overlying external artifact. Increased density is noted along the inferior aspect of the cavity which may represent some more acute wall thickening and inflammatory change. Emphysematous changes are again seen. IMPRESSION: Stable changes in the right base. More apparent cavitary lesion in the left upper lobe when compared with the prior exam. This is in part due to overlying artifact although some wall thickening in the inferior aspect of the cavity is noted. CT of the chest is recommended for further evaluation as well as per prior recommendations. Although not mentioned in the body of the report, a left-sided PICC line is again seen and stable. Electronically Signed   By: Inez Catalina M.D.   On: 08/29/2015 10:59   Ct Chest Wo Contrast  08/29/2015  CLINICAL DATA:  Hypoxia and fever. Cough and shortness of breath. COPD. Enlarging left upper lobe cavitary lesions seen on recent chest radiograph. EXAM: CT CHEST WITHOUT CONTRAST TECHNIQUE: Multidetector CT imaging of the chest was performed following the standard protocol without IV  contrast. COMPARISON:  Chest radiograph on 08/29/2015 and CT on 05/01/2015 FINDINGS: Mediastinum/Lymph Nodes: No masses or pathologically enlarged lymph nodes identified on this un-enhanced exam. Tiny hiatal hernia again noted. Three-vessel coronary artery calcification again demonstrated. Lungs/Pleura: Severe emphysema again demonstrated with upper lobe predominance. Peripheral honeycombing is seen in the posterior lower lobes bilaterally which  is stable and consistent with chronic interstitial fibrosis. A new tiny right pleural effusion is seen. No evidence of left pleural effusion. A partially solid and partially cavitary lesion is seen in the left upper lobe which has increased in size since previous study. This currently measures 3.7 x 5.4 cm on image 19/series 3 compared to 2.1 x 3.2 cm previously. This is suspicious for enlarging cavitary bronchogenic carcinoma, although differential diagnosis still includes a cavitary infectious or inflammatory process. Upper abdomen: No acute findings. Musculoskeletal: No chest wall mass or suspicious bone lesions identified. IMPRESSION: Enlarging mixed solid and cavitary lesion in the left upper lobe. 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 Consider bronchoscopy or percutaneous needle biopsy. Severe bullous emphysema and chronic bibasilar predominant interstitial fibrosis. Increased tiny right pleural effusion. No definite evidence of thoracic lymphadenopathy. Electronically Signed   By: Earle Gell M.D.   On: 08/29/2015 12:50   US Abdomen Complete  08/20/2015  CLINICAL DATA:  Elevated LFTs, upper abdominal pain EXAM: ABDOMEN ULTRASOUND COMPLETE COMPARISON:  CT abdomen pelvis dated 05/01/2015 FINDINGS: Gallbladder: No gallstones, gallbladder wall thickening, or pericholecystic fluid. Negative sonographic Murphy's sign. Common bile duct: Diameter: 4 mm  Liver: Coarse hepatic echotexture with hyperechoic hepatic parenchyma, nonspecific but likely reflecting hepatic steatosis. No focal hepatic lesion is seen. IVC: No abnormality visualized. Pancreas: Visualized portion unremarkable. Spleen: Size and appearance within normal limits. Right Kidney: Length: 12.3 cm.  No mass or hydronephrosis. Left Kidney: Length: 12.7 cm.  No mass or hydronephrosis. Abdominal aorta: No aneurysm visualized. Other findings: None. IMPRESSION: Suspected hepatic steatosis. Otherwise negative abdominal ultrasound. Electronically Signed   By: Julian Hy M.D.   On: 08/20/2015 11:56   US Venous Img Lower Bilateral  08/20/2015  CLINICAL DATA:  Lower extremity swelling for 1 month EXAM: BILATERAL LOWER EXTREMITY VENOUS DOPPLER ULTRASOUND TECHNIQUE: Gray-scale sonography with graded compression, as well as color Doppler and duplex ultrasound were performed to evaluate the lower extremity deep venous systems from the level of the common femoral vein and including the common femoral, femoral, profunda femoral, popliteal and calf veins including the posterior tibial, peroneal and gastrocnemius veins when visible. The superficial great saphenous vein was also interrogated. Spectral Doppler was utilized to evaluate flow at rest and with distal augmentation maneuvers in the common femoral, femoral and popliteal veins. COMPARISON:  None. FINDINGS: RIGHT LOWER EXTREMITY Common Femoral Vein: No evidence of thrombus. Normal compressibility, respiratory phasicity and response to augmentation. Saphenofemoral Junction: No evidence of thrombus. Normal compressibility and flow on color Doppler imaging. Profunda Femoral Vein: No evidence of thrombus. Normal compressibility and flow on color Doppler imaging. Femoral Vein: No evidence of thrombus. Normal compressibility, respiratory phasicity and response to augmentation. Popliteal Vein: No evidence of thrombus. Normal compressibility, respiratory phasicity  and response to augmentation. Calf Veins: No evidence of thrombus. Normal compressibility and flow on color Doppler imaging. Superficial Great Saphenous Vein: No evidence of thrombus. Normal compressibility and flow on color Doppler imaging. Venous Reflux:  None. Other Findings:  Mild subcutaneous edema is noted. LEFT LOWER EXTREMITY Common Femoral Vein: No evidence of thrombus. Normal compressibility, respiratory phasicity and response to augmentation. Saphenofemoral Junction: No evidence of thrombus. Normal compressibility and flow on color Doppler imaging. Profunda Femoral Vein: No evidence of thrombus. Normal compressibility and flow on color Doppler imaging. Femoral Vein: No evidence of thrombus. Normal compressibility, respiratory phasicity and response to augmentation. Popliteal Vein: No evidence of  thrombus. Normal compressibility, respiratory phasicity and response to augmentation. Calf Veins: No evidence of thrombus. Normal compressibility and flow on color Doppler imaging. Superficial Great Saphenous Vein: No evidence of thrombus. Normal compressibility and flow on color Doppler imaging. Venous Reflux:  None. Other Findings: Mild subcutaneous edema is noted. 3.9 cm lymph node is noted in the left groin. This is new from the prior exam of October 2016. IMPRESSION: No evidence of deep venous thrombosis. Bilateral lower extremity subcutaneous edema. Prominent lymph node in the left inguinal region likely reactive in nature. Clinical correlation is recommended. Electronically Signed   By: Inez Catalina M.D.   On: 08/20/2015 11:49   Dg Chest Portable 1 View  08/17/2015  CLINICAL DATA:  Shortness of breath. Pneumonia. Subsequent encounter. EXAM: PORTABLE CHEST 1 VIEW COMPARISON:  Single view of the chest 07/26/2015. FINDINGS: Endotracheal tube and NG tube been removed. Small right pleural effusion is decreased in size since the comparison study. There is streaky right basilar airspace disease. Mild left  basilar atelectasis is unchanged. The lungs are markedly emphysematous. Scar in the left upper lobe is not well demonstrated due to overlying tubes. No pneumothorax. Heart size normal. IMPRESSION: Decreased right pleural effusion with persistent streaky right basilar airspace opacity which is worrisome for pneumonia. Emphysema. Electronically Signed   By: Inge Rise M.D.   On: 08/17/2015 11:17   Dg Foot 2 Views Left  08/20/2015  CLINICAL DATA:  LEFT foot pain and swelling EXAM: LEFT FOOT - 2 VIEW COMPARISON:  Portable exam 1413 hours compared to 05/01/2015 FINDINGS: Bones appear demineralized. Joint spaces preserved. Displaced fracture fragment identified at distal lateral aspect of the calcaneus at calcaneal cuboid joint. No additional fracture, dislocation or bone destruction. Diffuse soft tissue swelling LEFT foot greatest at dorsum. IMPRESSION: Subacute distal LEFT calcaneal fracture extending intact calcaneocuboid joint. Marked soft tissue swelling without additional acute bony abnormalities. Electronically Signed   By: Lavonia Dana M.D.   On: 08/20/2015 14:33   Dg Swallowing Func-speech Pathology  08/22/2015  Ephraim Hamburger, CCC-SLP     08/22/2015 10:42 PM Objective Swallowing Evaluation: Type of Study: MBS-Modified Barium Swallow Study Patient Details Name: NEKODA CHOCK MRN: [id number] Date of Birth: 04-10-1961 Today's Date: 08/22/2015 Time: SLP Start Time (ACUTE ONLY): 1310-SLP Stop Time (ACUTE ONLY): 1430 SLP Time Calculation (min) (ACUTE ONLY): 80 min Past Medical History: Past Medical History Diagnosis Date . COPD (chronic obstructive pulmonary disease) ([place])  . Hypercholesteremia  . History of pneumonia  . Polycythemia  . Coronary atherosclerosis of native coronary artery    Mild nonobstructive disease at catheterization 2007 . Cavitary lesion of lung 05/07/2011   Cultures grew MAI, tx antibiotics . Borderline diabetes  . Seizures ([place])    Last seizure 2 yrs ago . GERD (gastroesophageal reflux  disease)  . Type 2 diabetes mellitus ([place])  . DDD (degenerative disc disease)    Cervical and thoracic . Chronic back pain  . Bronchitis  . Chronic left shoulder pain  . Chronic neck pain  . Atrial fibrillation ([place])    Not anticoagulated . Smoker  . Type 2 diabetes mellitus without complication ([place])  . CAP (community acquired pneumonia) 07/10/2013   04/2015 . Collagen vascular disease ([place])  . Chronic respiratory failure ([place])  . Barrett's esophagus  Past Surgical History: Past Surgical History Procedure Laterality Date . Colonoscopy  2012   Dr. Posey Pronto: normal . Vasectomy   . Throat biopsy   . Lung biopsy   . Vasectomy  1987 .  Esophagogastroduodenoscopy N/A 05/04/2015   Dr. Michail Sermon: minimal erosive esophagitis, small hiatal hernia. FOOD PRECLUDED A COMPLETE EXAM. No biopsies taken . Esophagogastroduodenoscopy  2011   [place]: Barrett's  HPI: Glennon Kopko Trillo is a 55 y.o. year old male with a history of COPD, presenting with increasing shortness of breath and hypoxia. O2 sats were 70% in the field prior to arrival. Findings of questionable pneumonia on chest xray. History of MRSA bacteremia in Jan 2017, requiring prolonged admission. GI consulted due to IDA. Denies any overt GI bleeding to include melena, hematochezia, hematemesis. Hemoccult negative in ED. Very rare nausea. Has felt light-headed and dizzy. Notes chronic GERD, on Protonix BID as outpatient. No dysphagia. No abdominal pain. Denies diarrhea, constipation. He denies any weight loss. Has a good appetite. Notes lower extremity pitting edema and feels like his abdomen is distended as well. He recently had an EGD by Dr. Michail Sermon in Oct 2016 with minimal erosive esophagitis and small hiatal hernia. However, he had food in his stomach, which precluded complete evaluation of upper GI tract. History of Barrett's on EGD in 2011. Elevated LFTs; in setting of fatty liver. He denies any prior known history of chronic liver disease. GI following and may do EGD once  stable from respiratory standpoint. Dr. Luan Pulling requested SLP for swallowing evaluation. Subjective: Pt reports feeling ok Assessment / Plan / Recommendation CHL IP CLINICAL IMPRESSIONS 08/22/2015 Therapy Diagnosis Mild pharyngeal phase dysphagia Clinical Impression Mild pharyngeal phase with min delay in swallow initiation and trace residuals in valleculae after the swallow with thins. Pt with one single episode of trace, silent aspiration of thins when he inhaled just after primary swallow and before secondary swallow elicited. Strong coughing episode did eventually occur after several seconds when aspirate dropped deeper into trachea (suspected). Pt was challenged throughout the rest of the evaluation (straw sips, mixed consistencies, sequential swallows), however no further penetration or aspiration observed. Pt did continue to have mild vallecular residue (lingual coating dripped down) after thins, but no further aspiration visualized. Suspect pt has transient episodes of aspiration over the course of a meal and possibly with pooled secretions at times given pt/wife report of strong coughing episodes where he cannot catch his breath for several minutes. Many pt's with severe COPD have decreased sensation for residuals in pharynx/near airway and are therefore at higher risk for aspiration. Given frequent bouts of PNA, increased oxygen demands, severe COPD, and increased work of breath during meals, recommend trial period of nectar-thick liquids to see if symptoms can be alleviated. This was discussed at length with pt and wife and they are in agreement with plan. Pt continues to be at risk for aspiration of oral secretions (dripping down to vallecular space) and reportedly has GERD, however I am hopeful that over the course of a meal, aspiration of liquids will be reduced with liquid consistency modification and implementation of swallow strategies (breath hold, swallow, exhale, swallow again). SLP provided  written information to pt/wife and will follow up while in acute setting. Pt currently on fluid restriction per cardiology, however SLP will recommend [facility] water protocol (oral care followed by free water only (no other thin liquids) between meals while in adherence to fluid restrictions. Recommend that pt thicken all other liquids to nectar-consistency for 3-4 weeks with reassessment of needs at that time. Pt given SLP phone number to remain in contact should he have further questions after discharge. Impact on safety and function Moderate aspiration risk   CHL IP TREATMENT RECOMMENDATION 08/22/2015 Treatment  Recommendations Therapy as outlined in treatment plan below   Prognosis 08/22/2015 Prognosis for Safe Diet Advancement Fair Barriers to Reach Goals (No Data) Barriers/Prognosis Comment -- CHL IP DIET RECOMMENDATION 08/22/2015 SLP Diet Recommendations Dysphagia 3 (Mech soft) solids;Nectar thick liquid;Free water protocol after oral care Liquid Administration via Cup Medication Administration Whole meds with puree Compensations Slow rate;Small sips/bites;Multiple dry swallows after each bite/sip Postural Changes Remain semi-upright after after feeds/meals (Comment);Seated upright at 90 degrees   CHL IP OTHER RECOMMENDATIONS 08/22/2015 Recommended Consults -- Oral Care Recommendations Oral care BID;Patient independent with oral care Other Recommendations Clarify dietary restrictions;Order thickener from pharmacy   CHL IP FOLLOW UP RECOMMENDATIONS 08/22/2015 Follow up Recommendations None   CHL IP FREQUENCY AND DURATION 08/22/2015 Speech Therapy Frequency (ACUTE ONLY) min 2x/week Treatment Duration 1 week      CHL IP ORAL PHASE 08/22/2015 Oral Phase WFL Oral - Pudding Teaspoon -- Oral - Pudding Cup -- Oral - Honey Teaspoon -- Oral - Honey Cup -- Oral - Nectar Teaspoon -- Oral - Nectar Cup -- Oral - Nectar Straw -- Oral - Thin Teaspoon -- Oral - Thin Cup -- Oral - Thin Straw -- Oral - Puree -- Oral - Mech Soft -- Oral -  Regular -- Oral - Multi-Consistency -- Oral - Pill -- Oral Phase - Comment --  CHL IP PHARYNGEAL PHASE 08/22/2015 Pharyngeal Phase Impaired Pharyngeal- Pudding Teaspoon -- Pharyngeal -- Pharyngeal- Pudding Cup -- Pharyngeal -- Pharyngeal- Honey Teaspoon -- Pharyngeal -- Pharyngeal- Honey Cup -- Pharyngeal -- Pharyngeal- Nectar Teaspoon -- Pharyngeal -- Pharyngeal- Nectar Cup Delayed swallow initiation-vallecula Pharyngeal -- Pharyngeal- Nectar Straw -- Pharyngeal -- Pharyngeal- Thin Teaspoon -- Pharyngeal -- Pharyngeal- Thin Cup Delayed swallow initiation-vallecula;Pharyngeal residue - valleculae;Trace aspiration Pharyngeal -- Pharyngeal- Thin Straw Delayed swallow initiation-vallecula Pharyngeal -- Pharyngeal- Puree WFL Pharyngeal -- Pharyngeal- Mechanical Soft -- Pharyngeal -- Pharyngeal- Regular WFL Pharyngeal -- Pharyngeal- Multi-consistency -- Pharyngeal -- Pharyngeal- Pill WFL Pharyngeal -- Pharyngeal Comment trace silent aspiration occurred after the swallow from residuals in valleculae  CHL IP CERVICAL ESOPHAGEAL PHASE 08/22/2015 Cervical Esophageal Phase WFL Pudding Teaspoon -- Pudding Cup -- Honey Teaspoon -- Honey Cup -- Nectar Teaspoon -- Nectar Cup -- Nectar Straw -- Thin Teaspoon -- Thin Cup -- Thin Straw -- Puree -- Mechanical Soft -- Regular -- Multi-consistency -- Pill -- Cervical Esophageal Comment -- Thank you, Genene Churn, CCC-SLP [phone] PORTER,DABNEY 08/22/2015, 3:19 PM               Lab Results: Basic Metabolic Panel:  Recent Labs  09/03/15 0655 09/05/15 0643  NA 140  --   K 3.7  --   CL 104  --   CO2 29  --   GLUCOSE 146*  --   BUN 6  --   CREATININE 1.17 1.13  CALCIUM 7.9*  --    Liver Function Tests: No results for input(s): AST, ALT, ALKPHOS, BILITOT, PROT, ALBUMIN in the last 72 hours.   CBC:  Recent Labs  09/03/15 0655  WBC 9.3  NEUTROABS 5.9  HGB 7.6*  HCT 24.6*  MCV 78.3  PLT 204    Recent Results (from the past 240 hour(s))  Culture, blood  (Routine X 2) w Reflex to ID Panel     Status: None   Collection Time: 08/29/15 12:10 PM  Result Value Ref Range Status   Specimen Description BLOOD RIGHT ANTECUBITAL  Final   Special Requests BOTTLES DRAWN AEROBIC AND ANAEROBIC 10CC  Final   Culture  Setup Time  Final    GRAM NEGATIVE RODS ANAEROBIC BOTTLE ONLY CRITICAL RESULT CALLED TO, READ BACK BY AND VERIFIED WITH: V DILDY,RN AT 1333 08/30/15 BY L BENFIELD    Culture   Final    PANTOEA SPECIES Performed at [facility]    Report Status 09/04/2015 FINAL  Final   Organism ID, Bacteria PANTOEA SPECIES  Final      Susceptibility   Pantoea species - MIC*    CEFAZOLIN <=4 SENSITIVE Sensitive     CEFEPIME <=1 SENSITIVE Sensitive     CEFTAZIDIME <=1 SENSITIVE Sensitive     CEFTRIAXONE <=1 SENSITIVE Sensitive     CIPROFLOXACIN <=0.25 SENSITIVE Sensitive     GENTAMICIN <=1 SENSITIVE Sensitive     IMIPENEM <=0.25 SENSITIVE Sensitive     TRIMETH/SULFA <=20 SENSITIVE Sensitive     * PANTOEA SPECIES  Culture, blood (Routine X 2) w Reflex to ID Panel     Status: None   Collection Time: 08/29/15 12:20 PM  Result Value Ref Range Status   Specimen Description BLOOD RIGHT WRIST  Final   Special Requests BOTTLES DRAWN AEROBIC AND ANAEROBIC 6CC EACH  Final   Culture NO GROWTH 5 DAYS  Final   Report Status 09/03/2015 FINAL  Final  Culture, respiratory (NON-Expectorated)     Status: None   Collection Time: 08/31/15  4:20 PM  Result Value Ref Range Status   Specimen Description SPUTUM EXPECTORATED  Final   Special Requests NONE  Final   Gram Stain   Final    ABUNDANT WBC PRESENT,BOTH PMN AND MONONUCLEAR FEW SQUAMOUS EPITHELIAL CELLS PRESENT MODERATE GRAM POSITIVE COCCI IN PAIRS IN CLUSTERS FEW GRAM NEGATIVE RODS THIS SPECIMEN IS ACCEPTABLE FOR SPUTUM CULTURE Performed at [facility]    Culture   Final    NORMAL OROPHARYNGEAL FLORA Performed at [facility]    Report Status 09/03/2015 FINAL  Final      Hospital Course: This is a 55 year old with long known history of severe COPD previous history of Mycobacterium avium intracellulare infection and who had been in the hospital with another exacerbation of COPD and acute on chronic respiratory failure. He was noted to have a cavitary lesion on chest x-ray and was started on treatment again for Mycobacterium avium intracellular area. Sputum's have been obtained but are not back yet. He improved clinically over the next several days with treatment for COPD exacerbation/pneumonia and mac. He is at maximum hospital benefit at the time of discharge.  Discharge Exam: Blood pressure 134/69, pulse 73, temperature 98.2 F (36.8 C), temperature source Oral, resp. rate 19, height '5\' 8"'$  (1.727 m), weight 65.318 kg (144 lb), SpO2 97 %. He is awake and alert. He looks more comfortable. His chest is much clearer.  Disposition: Home with home health services. Await AF[employer] cultures. If he is not positive for Mycobacterium avium [place] he's going to need bronchoscopy for cultures and also because his cavitary lesion could represent lung cancer.      Discharge Instructions    Discharge patient    Complete by:  As directed      Face-to-face encounter (required for Medicare/Medicaid patients)    Complete by:  As directed   I Aeriana Speece L certify that this patient is under my care and that I, or a nurse practitioner or physician's assistant working with me, had a face-to-face encounter that meets the physician face-to-face encounter requirements with this patient on 09/05/2015. The encounter with the patient was in whole, or in part for  the following medical condition(s) which is the primary reason for home health care (List medical condition): COPD exacerbation  The encounter with the patient was in whole, or in part, for the following medical condition, which is the primary reason for home health care:  COPD exacerbation  I certify that, based on my  findings, the following services are medically necessary home health services:   Nursing Physical therapy    Reason for Medically Necessary Home Health Services:  Skilled Nursing- Change/Decline in Patient Status  My clinical findings support the need for the above services:  Shortness of breath with activity  Further, I certify that my clinical findings support that this patient is homebound due to:  Shortness of Breath with activity     Home Health    Complete by:  As directed   To provide the following care/treatments:   PT RN               Signed: Chamika Cunanan L   09/05/2015, 9:21 AM

## 2015-09-05 NOTE — Care Management Important Message (Signed)
Important Message  Patient Details  Name: Ryan [name] MRN: [id number] Date of Birth: 06-15-1961   Medicare Important Message Given:  Yes    Alvie Heidelberg, RN 09/05/2015, 10:54 AM

## 2015-09-07 LAB — ACID FAST SMEAR (AFB, MYCOBACTERIA): Acid Fast Smear: POSITIVE

## 2015-09-23 ENCOUNTER — Telehealth: Payer: Self-pay | Admitting: Gastroenterology

## 2015-09-23 NOTE — Telephone Encounter (Signed)
Patient needs hospital followup, first nonurgent.

## 2015-09-24 ENCOUNTER — Encounter: Payer: Self-pay | Admitting: Internal Medicine

## 2015-09-24 NOTE — Telephone Encounter (Signed)
APPT MADE AND LETTER SENT  °

## 2015-10-02 IMAGING — CR DG CHEST 2V
2 series · 2 of 2 positions shown · non-contrast
Comparison: Chest radiograph performed 05/08/2014

CLINICAL DATA: Acute onset of generalized chest pain and bloody
sputum. Initial encounter.

EXAM:
CHEST  2 VIEW

[view not recorded (1 of 2)]
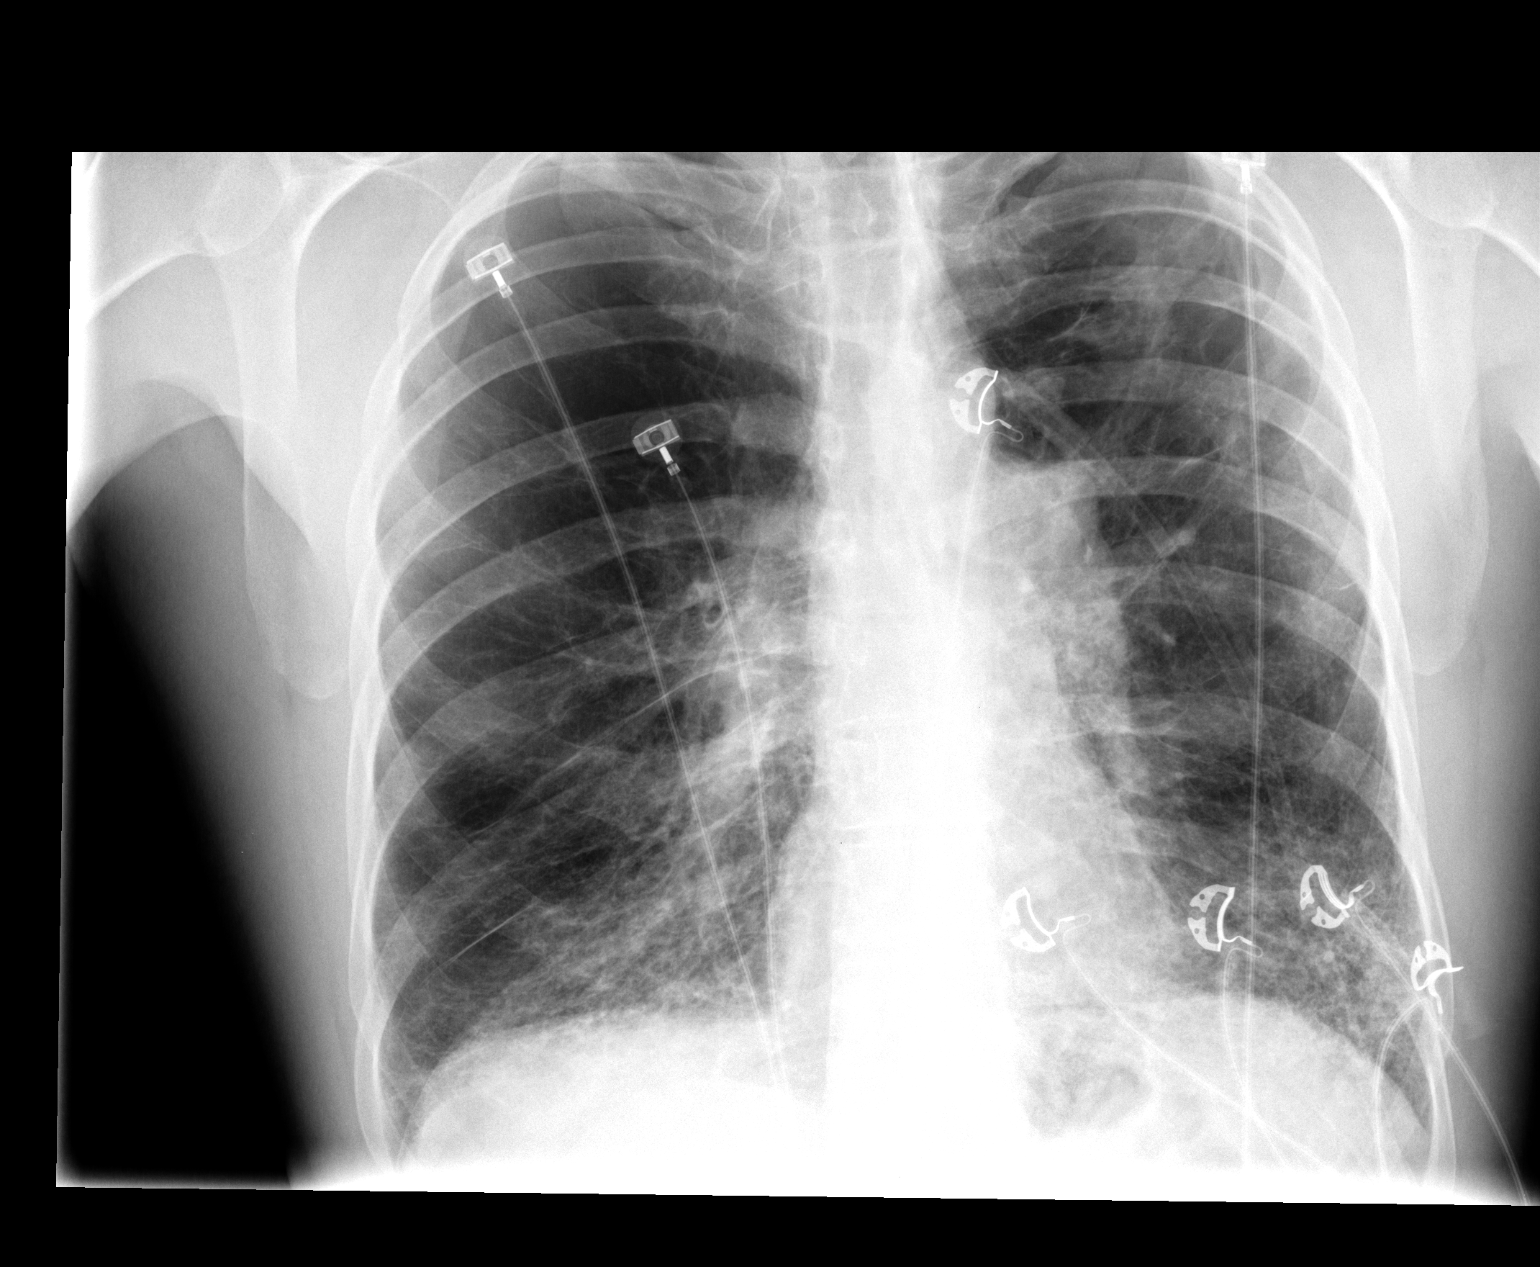

[view not recorded (2 of 2)]
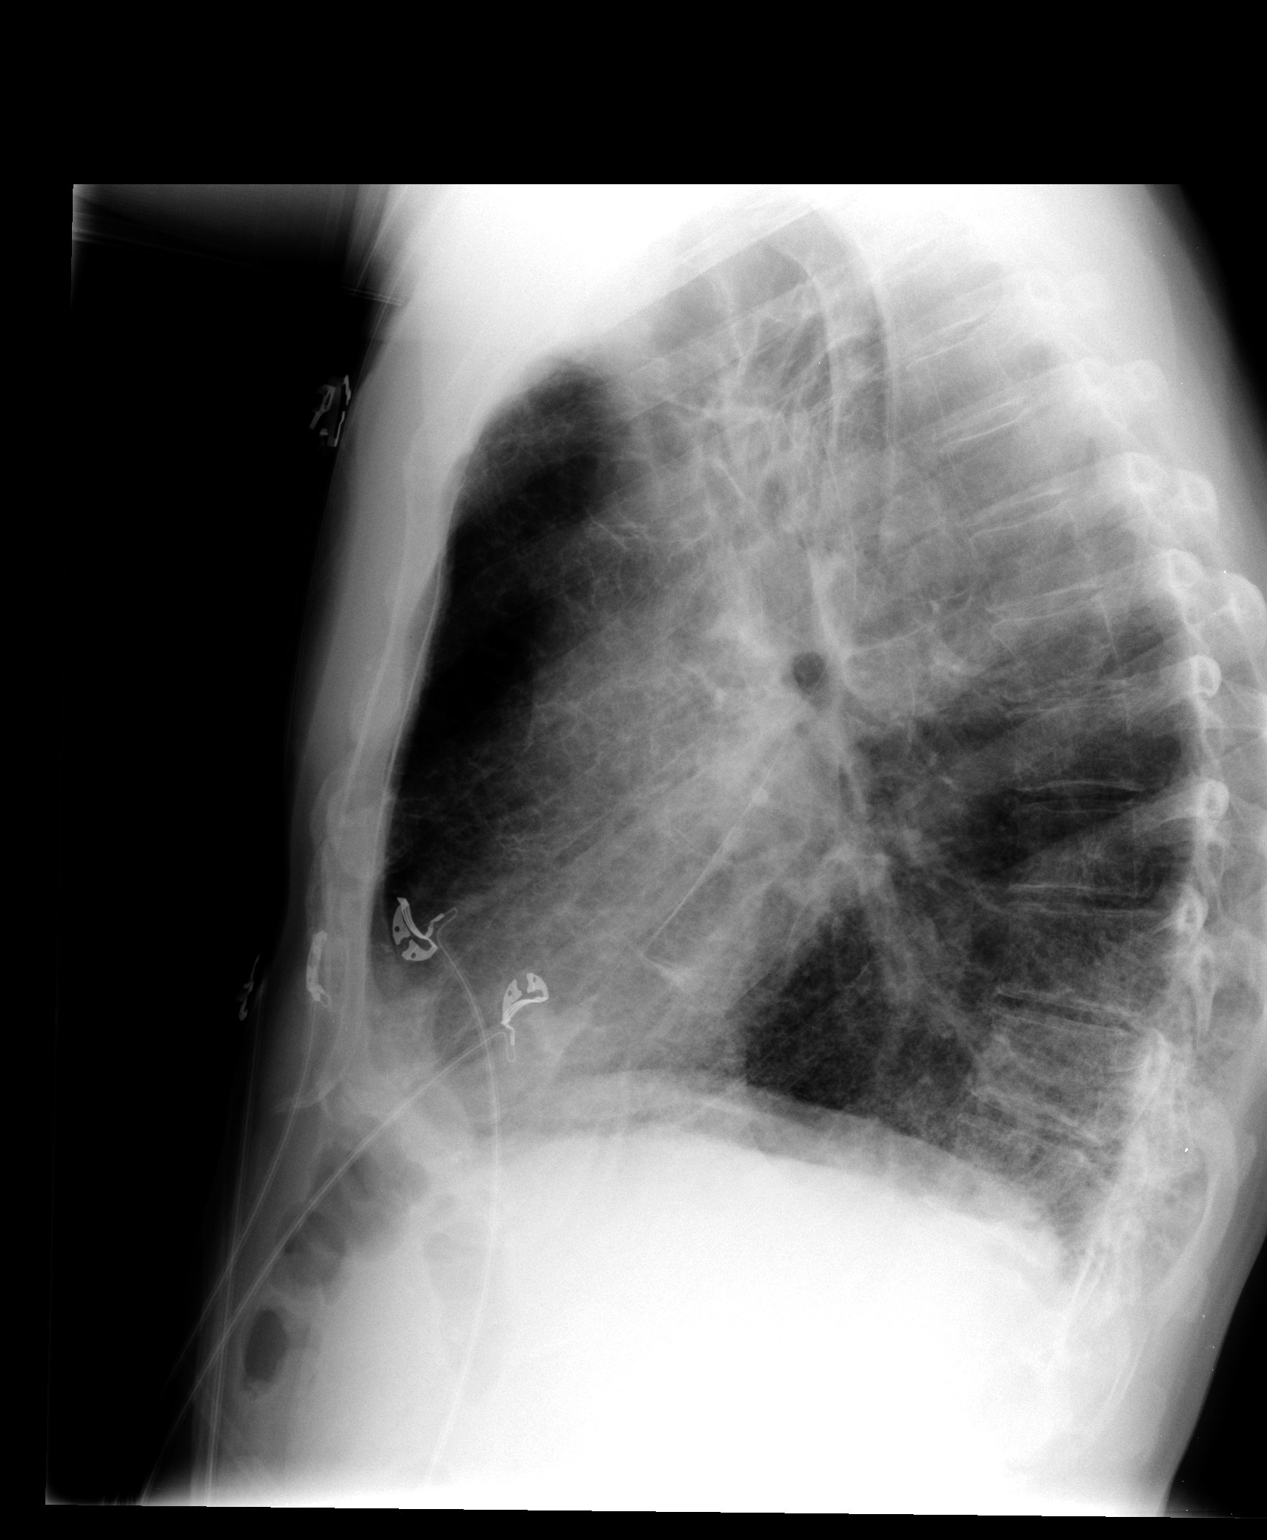

[2 of 2 positions shown; findings below may reference images not displayed]

FINDINGS: The lungs are well-aerated. Mildly worsened bibasilar interstitial
opacification could reflect chronic worsening interstitial lung
disease, though mild acute pneumonia could have a similar
appearance. Chronic peribronchial thickening is noted. The cavitary
lesion in the left upper lobe is slightly less well characterized.
Underlying emphysematous change is again seen. There is no evidence
of pleural effusion or pneumothorax.

The heart is normal in size; the mediastinal contour is within
normal limits. No acute osseous abnormalities are seen.
IMPRESSION: 1. Mildly worsened bibasilar interstitial opacification could
reflect chronic worsening interstitial lung disease, though mild
acute superimposed pneumonia could have a similar appearance.
2. Known cavitary lesion at the left upper lobe is slightly less
well characterized. Chronic peribronchial thickening noted.
Underlying emphysematous change again seen.

## 2015-10-03 LAB — MAC SUSCEPTIBILITY BROTH
Amikacin: 16
Ciprofloxacin: >16
Linezolid: 32

## 2015-10-03 LAB — AFB ORGANISM ID BY DNA PROBE
M AVIUM COMPLEX: POSITIVE — AB
M tuberculosis complex: NEGATIVE

## 2015-10-03 LAB — ACID FAST CULTURE WITH REFLEXED SENSITIVITIES: ACID FAST CULTURE - AFSCU3: POSITIVE — AB

## 2015-10-03 LAB — ACID FAST CULTURE WITH REFLEXED SENSITIVITIES (MYCOBACTERIA)

## 2015-10-11 ENCOUNTER — Encounter: Payer: Self-pay | Admitting: Gastroenterology

## 2015-10-11 ENCOUNTER — Ambulatory Visit: Payer: Medicare Other | Admitting: Gastroenterology

## 2015-10-11 ENCOUNTER — Telehealth: Payer: Self-pay | Admitting: Gastroenterology

## 2015-10-11 NOTE — Telephone Encounter (Signed)
PATIENT WAS A NO SHOW AND LETTER SENT  °

## 2015-10-17 LAB — AFB CULTURE WITH SMEAR (NOT AT ARMC): Special Requests: 1

## 2015-10-23 ENCOUNTER — Telehealth: Payer: Self-pay | Admitting: *Deleted

## 2015-10-23 NOTE — Telephone Encounter (Signed)
-----   Message from Tanda Rockers, MD sent at 10/23/2015  4:33 PM EDT ----- Call and see if he's f/u here or with [facility] and document his response but doesn't need ct in [place]  ----- Message -----    From: Rosana Berger, CMA    Sent: 10/23/2015   4:19 PM      To: Tanda Rockers, MD  Ct done 08/29/15 but it was not high res ----- Message -----    From: Tanda Rockers, MD    Sent: 05/10/2015   5:56 PM      To: Rosana Berger, CMA  Needs hrct f/u PF / nodules if not already ordered by Luan Pulling

## 2015-10-23 NOTE — Telephone Encounter (Signed)
LMTCB

## 2015-10-29 LAB — ACID FAST CULTURE WITH REFLEXED SENSITIVITIES (MYCOBACTERIA)

## 2015-10-29 LAB — AFB ID BY DNA PROBE
M AVIUM COMPLEX: POSITIVE
M TUBERCULOSIS COMPLEX: NEGATIVE

## 2015-10-29 LAB — ACID FAST CULTURE WITH REFLEXED SENSITIVITIES

## 2015-10-29 LAB — ACID FAST SMEAR (AFB): ACID FAST SMEAR - AFSCU2: POSITIVE — AB

## 2015-10-30 NOTE — Telephone Encounter (Signed)
LMTCB

## 2015-11-07 NOTE — Telephone Encounter (Signed)
LMTCB

## 2015-11-21 ENCOUNTER — Other Ambulatory Visit (HOSPITAL_COMMUNITY): Payer: Self-pay | Admitting: Pulmonary Disease

## 2015-11-21 DIAGNOSIS — J984 Other disorders of lung: Secondary | ICD-10-CM

## 2015-11-23 NOTE — Telephone Encounter (Signed)
That's fine - send letter

## 2015-11-23 NOTE — Telephone Encounter (Signed)
Dr [name]- I have left him 3 msgs now with no call back  Do you want me to mail him a letter? Please advise, thanks!

## 2015-11-26 ENCOUNTER — Encounter: Payer: Self-pay | Admitting: *Deleted

## 2015-11-26 IMAGING — DX DG CHEST 2V
2 series · 2 of 2 positions shown · non-contrast
Comparison: July 15, 2014 and May 08, 2014

CLINICAL DATA: Hemoptysis and night sweats for 1 month

EXAM:
CHEST  2 VIEW

[chest pa]
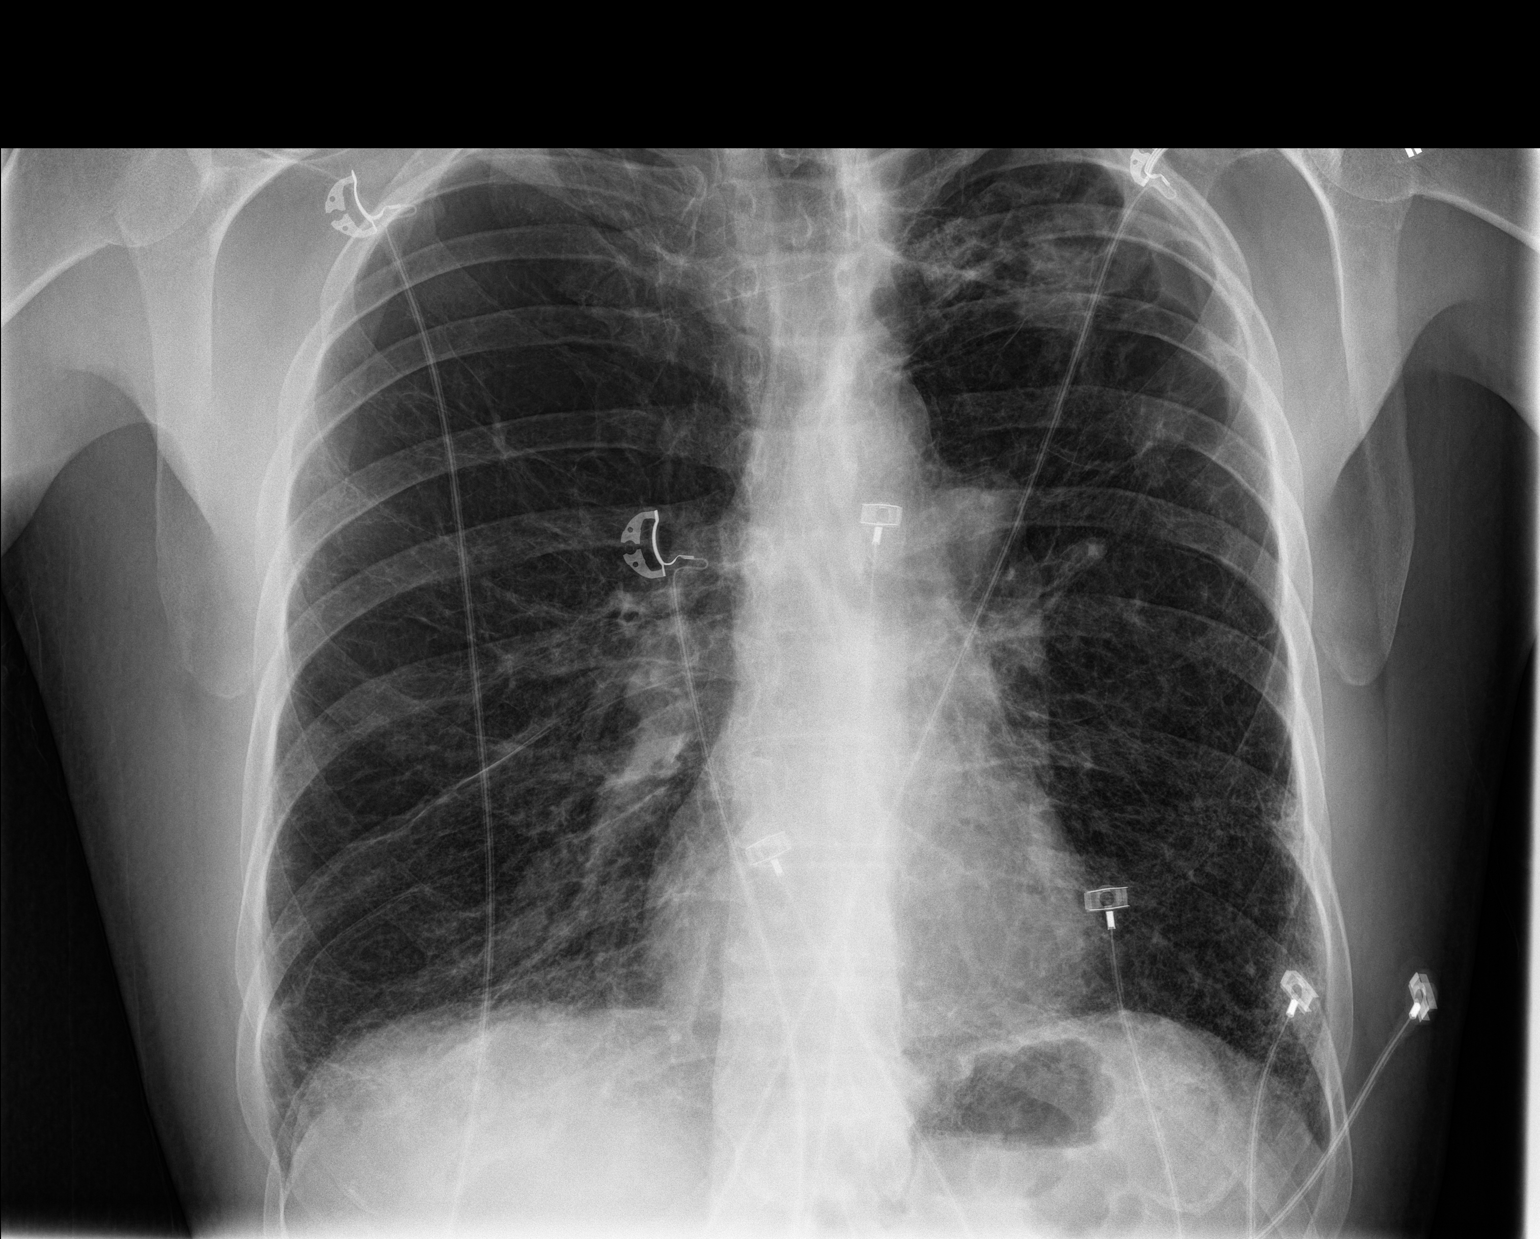

[chest lat]
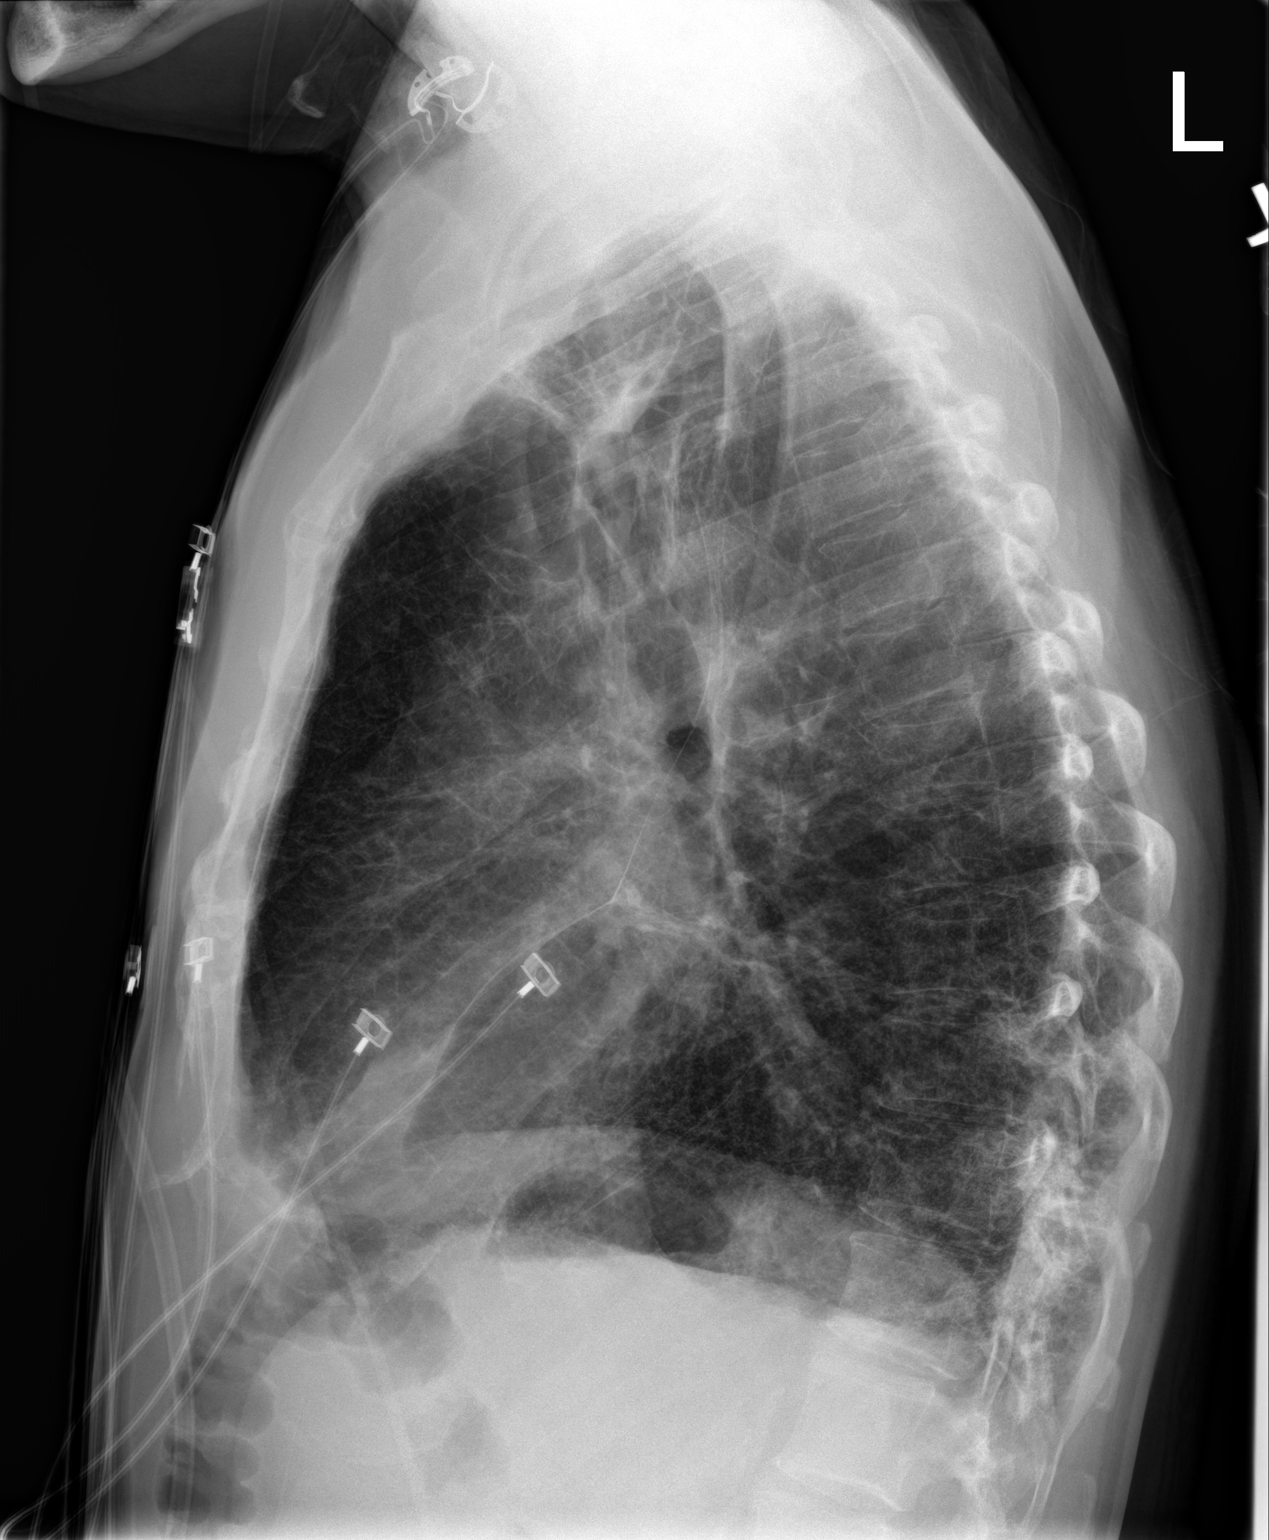

[2 of 2 positions shown; findings below may reference images not displayed]

FINDINGS: There is underlying emphysema with fibrotic type change in the lung
bases, more on the left than on the right. Scattered areas of
scarring are present in both upper and mid lungs. There is patchy
infiltrate currently in the left upper lobe near the apex. No other
focal infiltrate is seen. The heart size is normal. The pulmonary
vascularity reflects underlying emphysema. No adenopathy. No bone
lesions.
IMPRESSION: Infiltrate in the apical segment of the left upper lobe. Underlying
emphysema with areas of scarring and bibasilar interstitial
fibrosis.

## 2015-11-26 NOTE — Telephone Encounter (Signed)
Letter mailed to the pt. 

## 2015-11-28 ENCOUNTER — Ambulatory Visit (HOSPITAL_COMMUNITY)
Admission: RE | Admit: 2015-11-28 | Discharge: 2015-11-28 | Disposition: A | Discharge status: Home / Self Care | Payer: Medicare Other | Source: Ambulatory Visit | Attending: Pulmonary Disease | Admitting: Pulmonary Disease

## 2015-11-28 DIAGNOSIS — J984 Other disorders of lung: Secondary | ICD-10-CM

## 2015-11-28 DIAGNOSIS — R918 Other nonspecific abnormal finding of lung field: Secondary | ICD-10-CM | POA: Diagnosis present

## 2015-11-28 DIAGNOSIS — J439 Emphysema, unspecified: Secondary | ICD-10-CM | POA: Insufficient documentation

## 2015-11-28 LAB — POCT I-STAT CREATININE: CREATININE: 0.9 mg/dL (ref 0.61–1.24)

## 2015-11-28 IMAGING — CR DG CHEST 2V
2 series · 2 of 2 positions shown · non-contrast
Comparison: 09/08/2014

CLINICAL DATA: Shortness of breath

EXAM:
CHEST  2 VIEW

[view not recorded (1 of 2)]
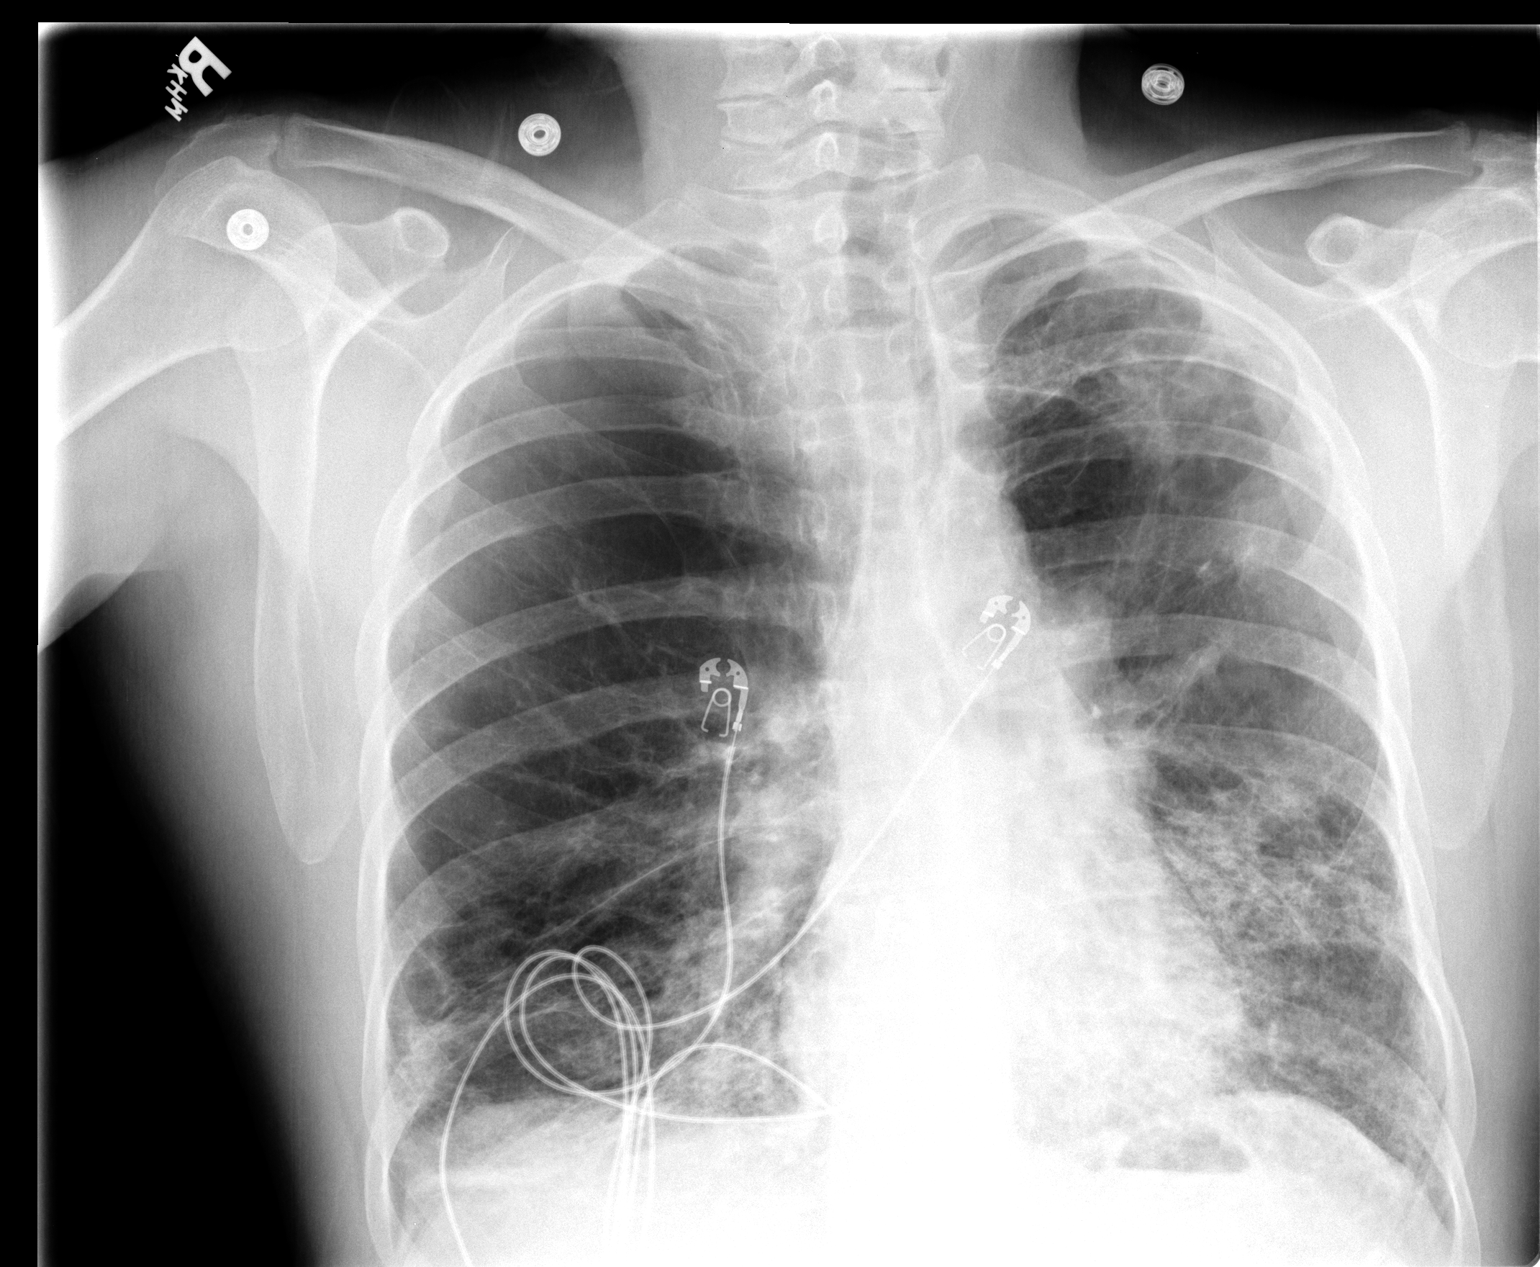

[view not recorded (2 of 2)]
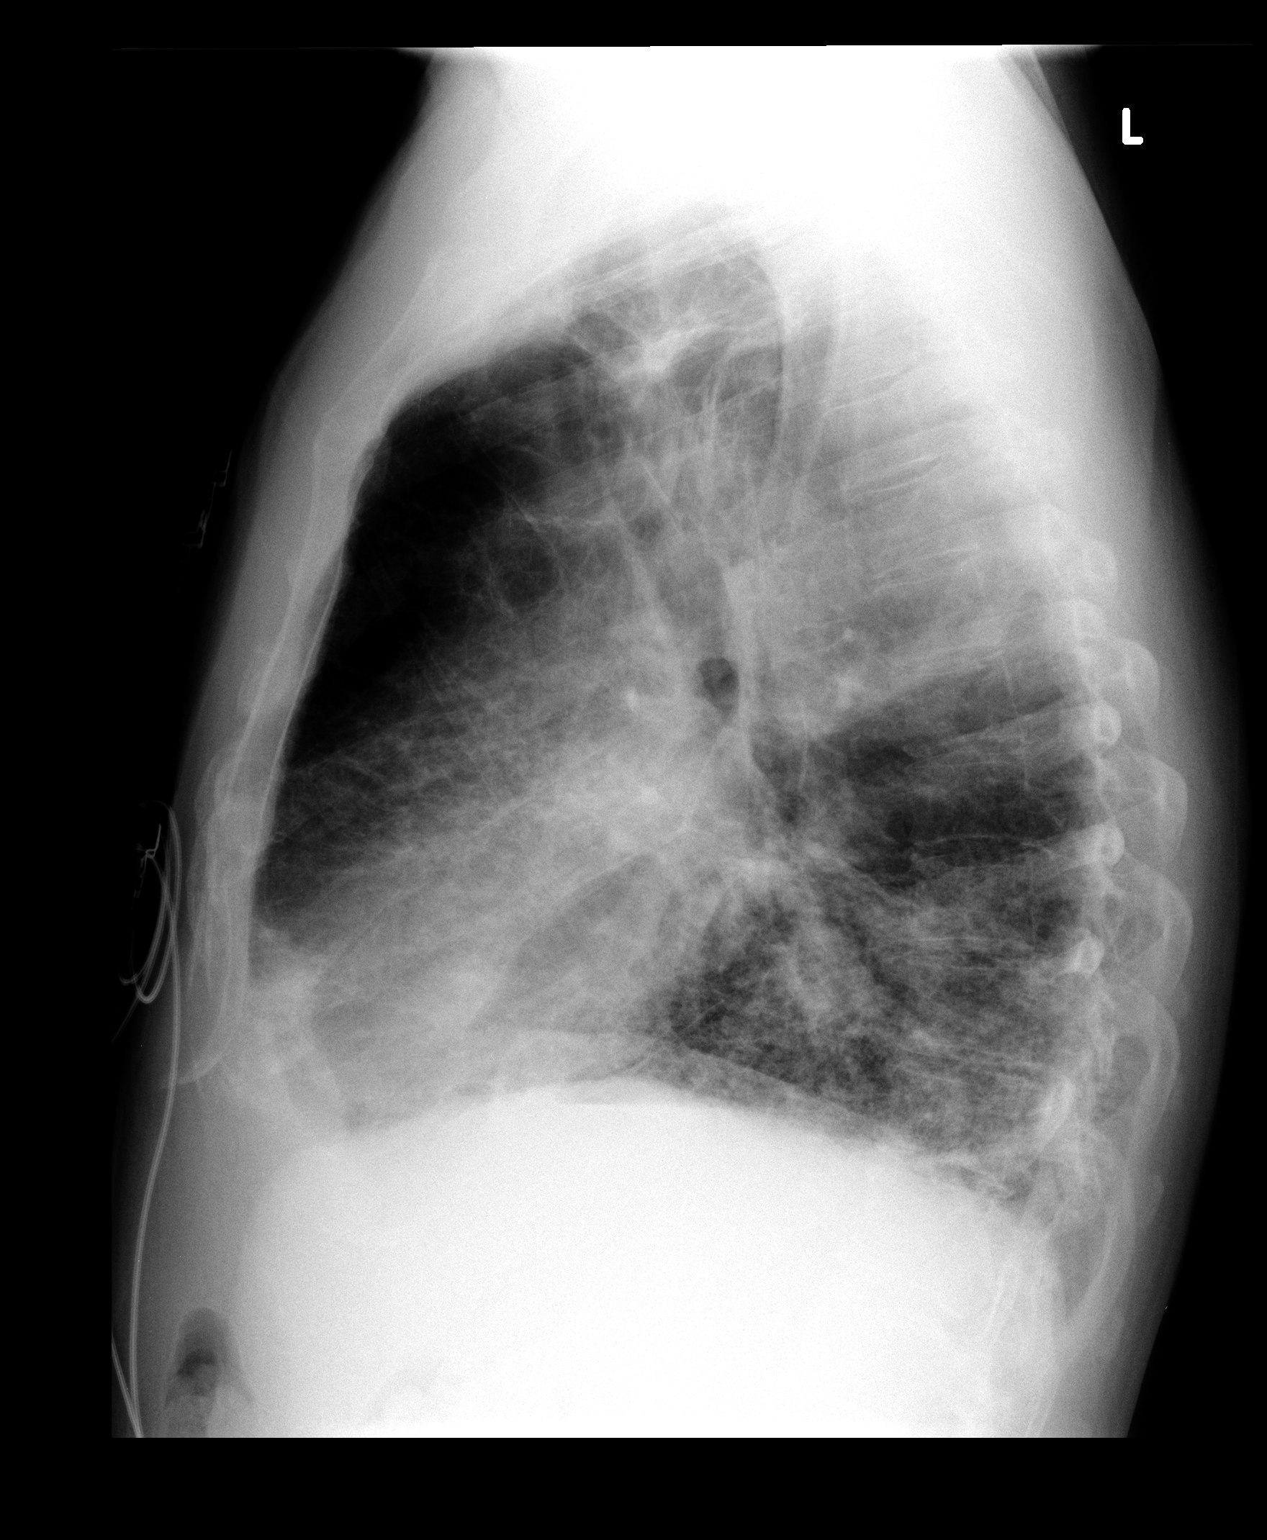

[2 of 2 positions shown; findings below may reference images not displayed]

FINDINGS: Chronic interstitial markings with fibrosis in the left lung apex
and bilateral lung bases.

Superimposed lingular/left lower lobe pneumonia is suspected.

No pleural effusion or pneumothorax.

The heart is normal in size.
IMPRESSION: Lingular/left lower lobe pneumonia.

Underlying chronic interstitial lung disease/fibrosis.

## 2015-11-28 MED ORDER — IOPAMIDOL (ISOVUE-300) INJECTION 61%
75.0000 mL | Freq: Once | INTRAVENOUS | Status: AC | PRN
  Administered 2015-11-28: 75 mL via INTRAVENOUS

## 2015-11-30 ENCOUNTER — Telehealth: Payer: Self-pay | Admitting: Internal Medicine
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
## 2015-12-03 NOTE — Telephone Encounter (Signed)
Spoke with pt and advised him that MW was fine for him to f/u with Dr Luan Pulling. Pt did receive letter with CT results and is to follow up with Dr Luan Pulling in 60mo. Nothing further needed.

## 2015-12-03 NOTE — Telephone Encounter (Signed)
Patient returned call, CB is [phone]

## 2015-12-03 NOTE — Telephone Encounter (Signed)
See last phone note- looks like this was regarding pt's follow up, which Dr. Luan Pulling is managing per CT chest ordered.

## 2015-12-03 NOTE — Telephone Encounter (Signed)
lmtcb x1 for pt. 

## 2015-12-03 NOTE — Telephone Encounter (Signed)
Will forward to MW so he is aware that f/u being done through Dr. Luan Pulling

## 2015-12-03 NOTE — Telephone Encounter (Signed)
Fine with me

## 2015-12-14 IMAGING — CR DG FOREARM 2V*L*
2 series · 2 of 2 positions shown · non-contrast
Comparison: None.

CLINICAL DATA: Status post fall 1 month ago with persistent mid
forearm pain.

EXAM:
LEFT FOREARM - 2 VIEW

[x forearm ap left]
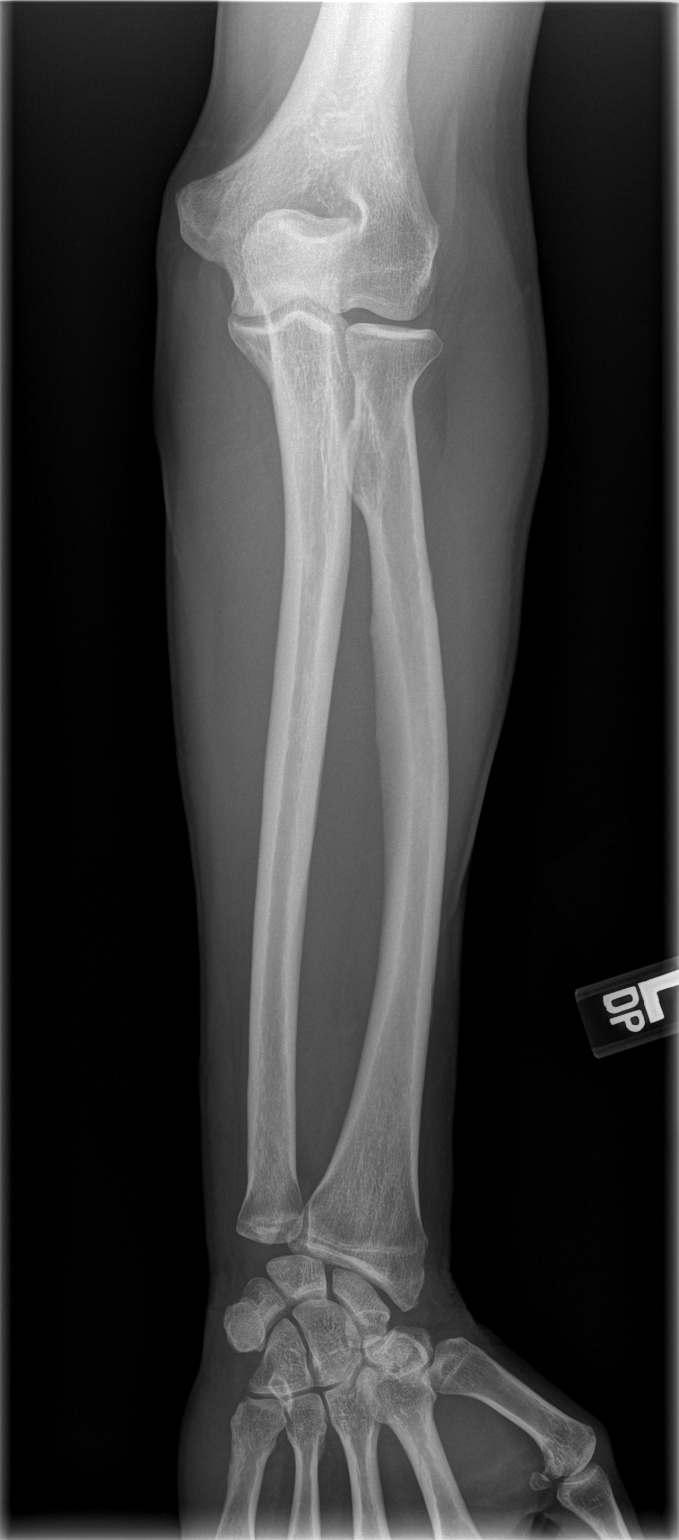

[x forearm lat left]
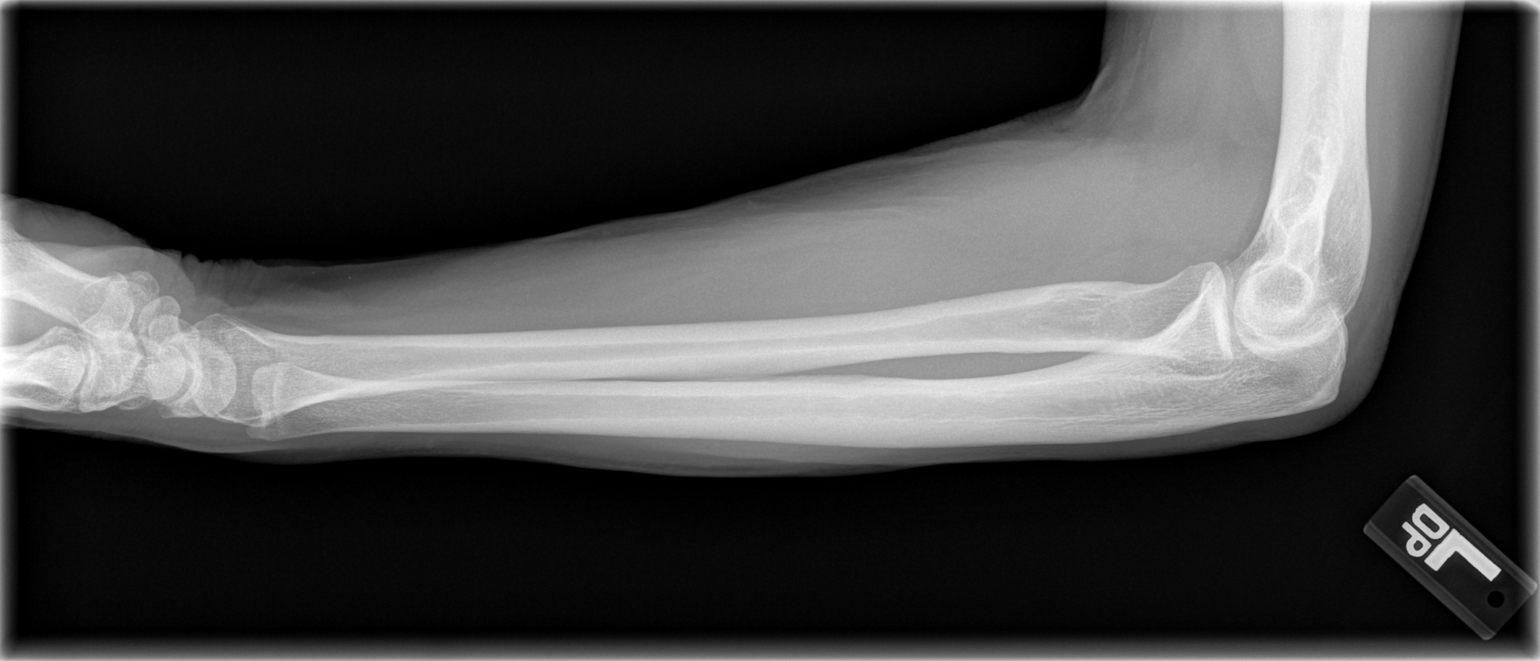

[2 of 2 positions shown; findings below may reference images not displayed]

FINDINGS: The radius and ulna are adequately mineralized. There is no acute or
healing fracture. The radial head and the olecranon are
unremarkable. There is no elbow effusion. The radiocarpal and
ulnocarpal joints are unremarkable. The soft tissues of the forearm
are normal.
IMPRESSION: There is no acute or chronic bony abnormality of the left radius or
ulna.

## 2015-12-14 IMAGING — CR DG KNEE COMPLETE 4+V*R*
4 series · 4 of 4 positions shown · non-contrast
Comparison: None.

CLINICAL DATA: Status post fall 1 month ago with persistent pain in
the patellar region

EXAM:
RIGHT KNEE - COMPLETE 4+ VIEW

[w knee ap right (1 of 2)]
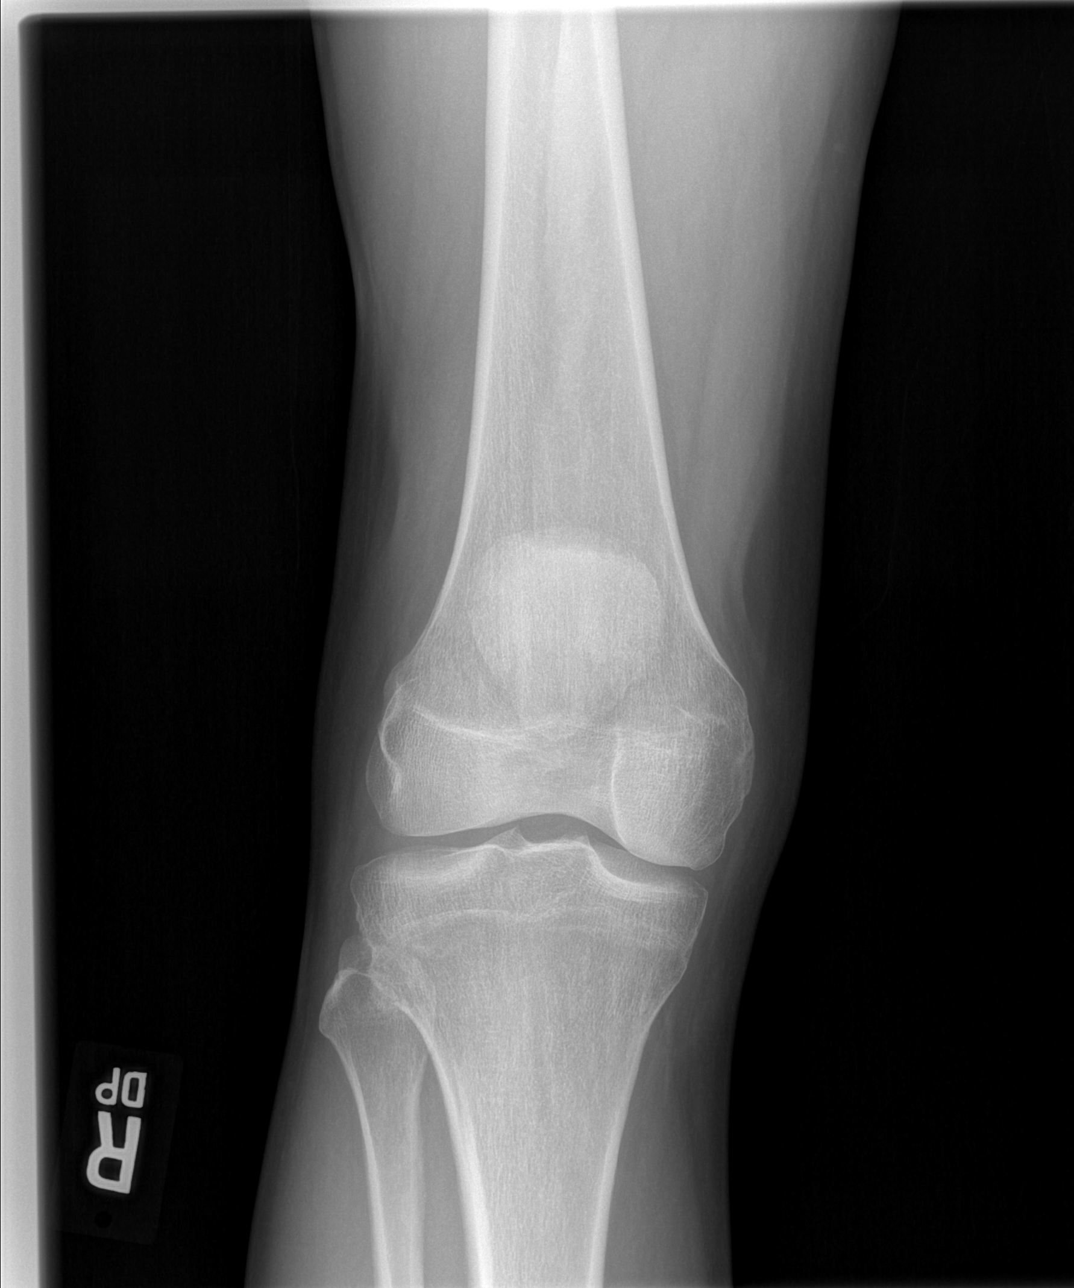

[w knee ap right (2 of 2)]
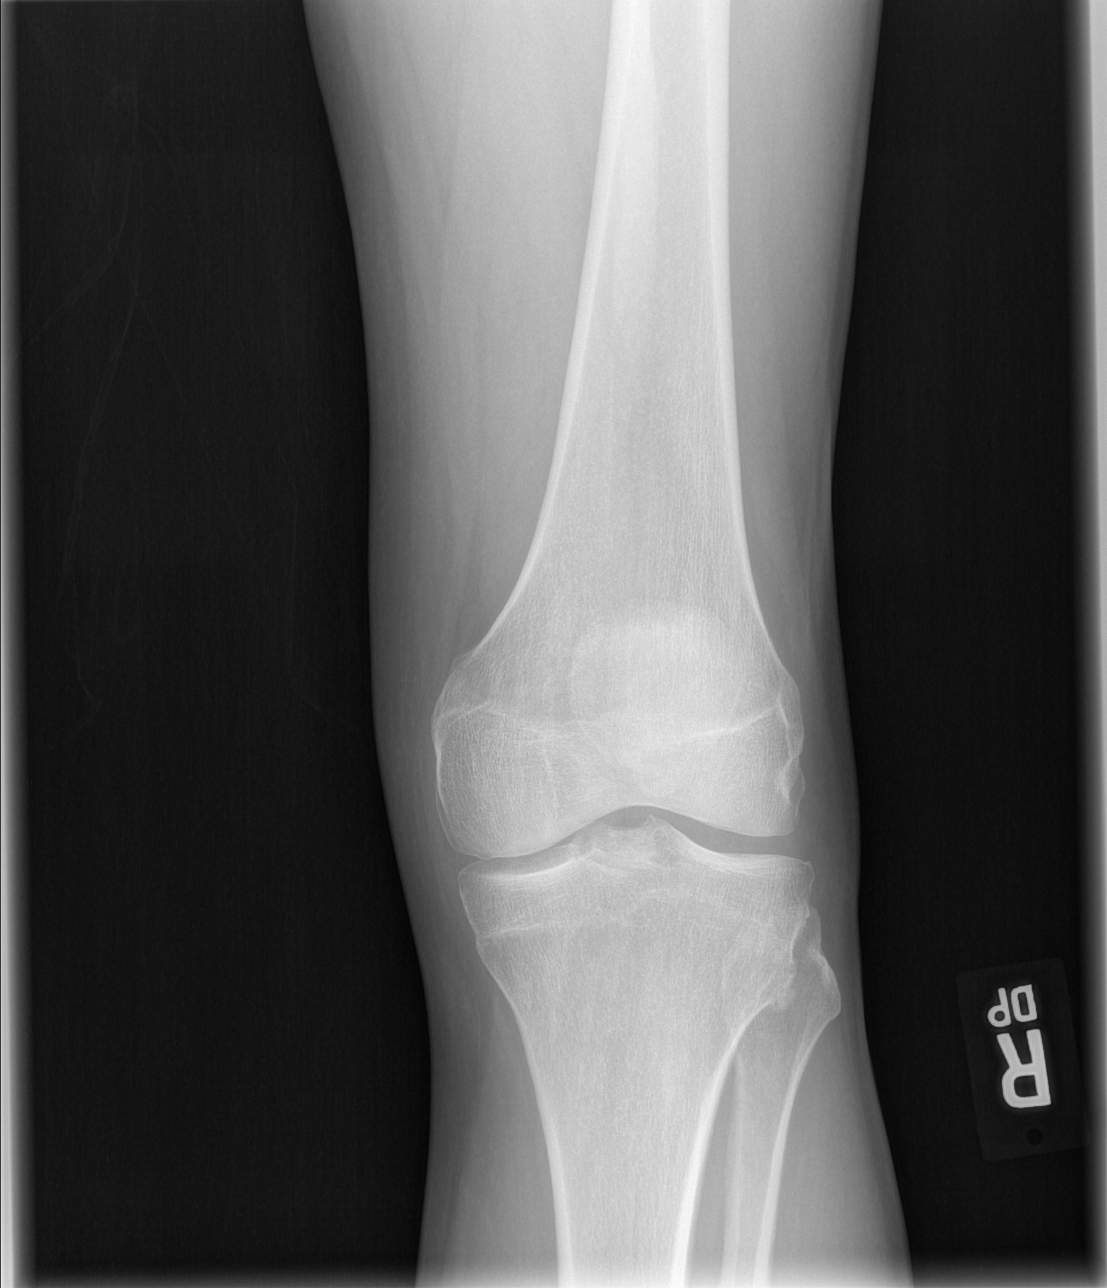

[w knee lat. right]
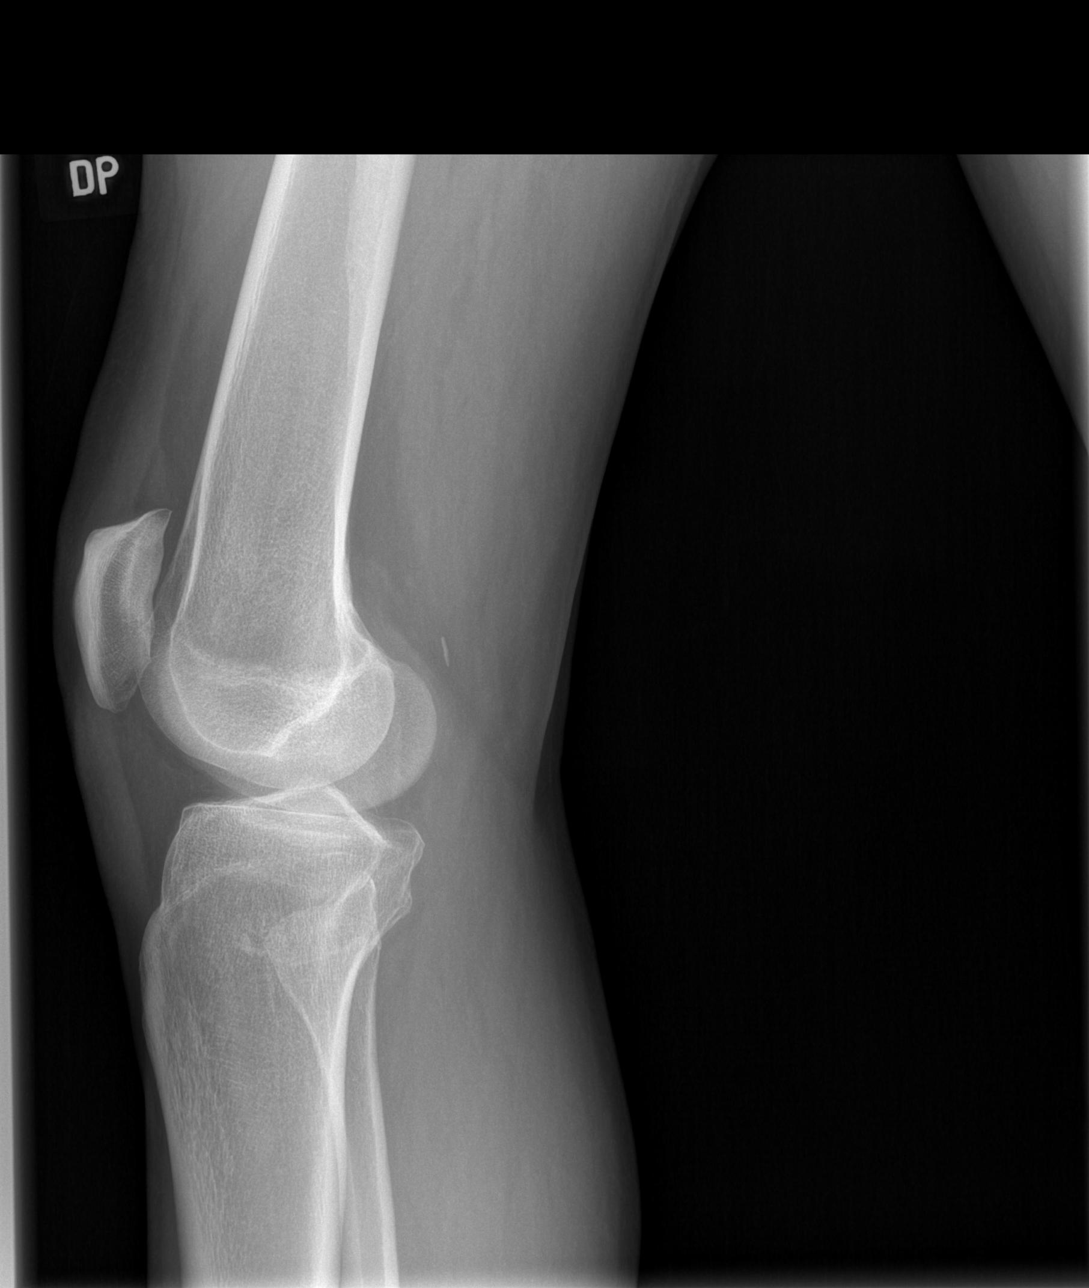

[view not recorded]
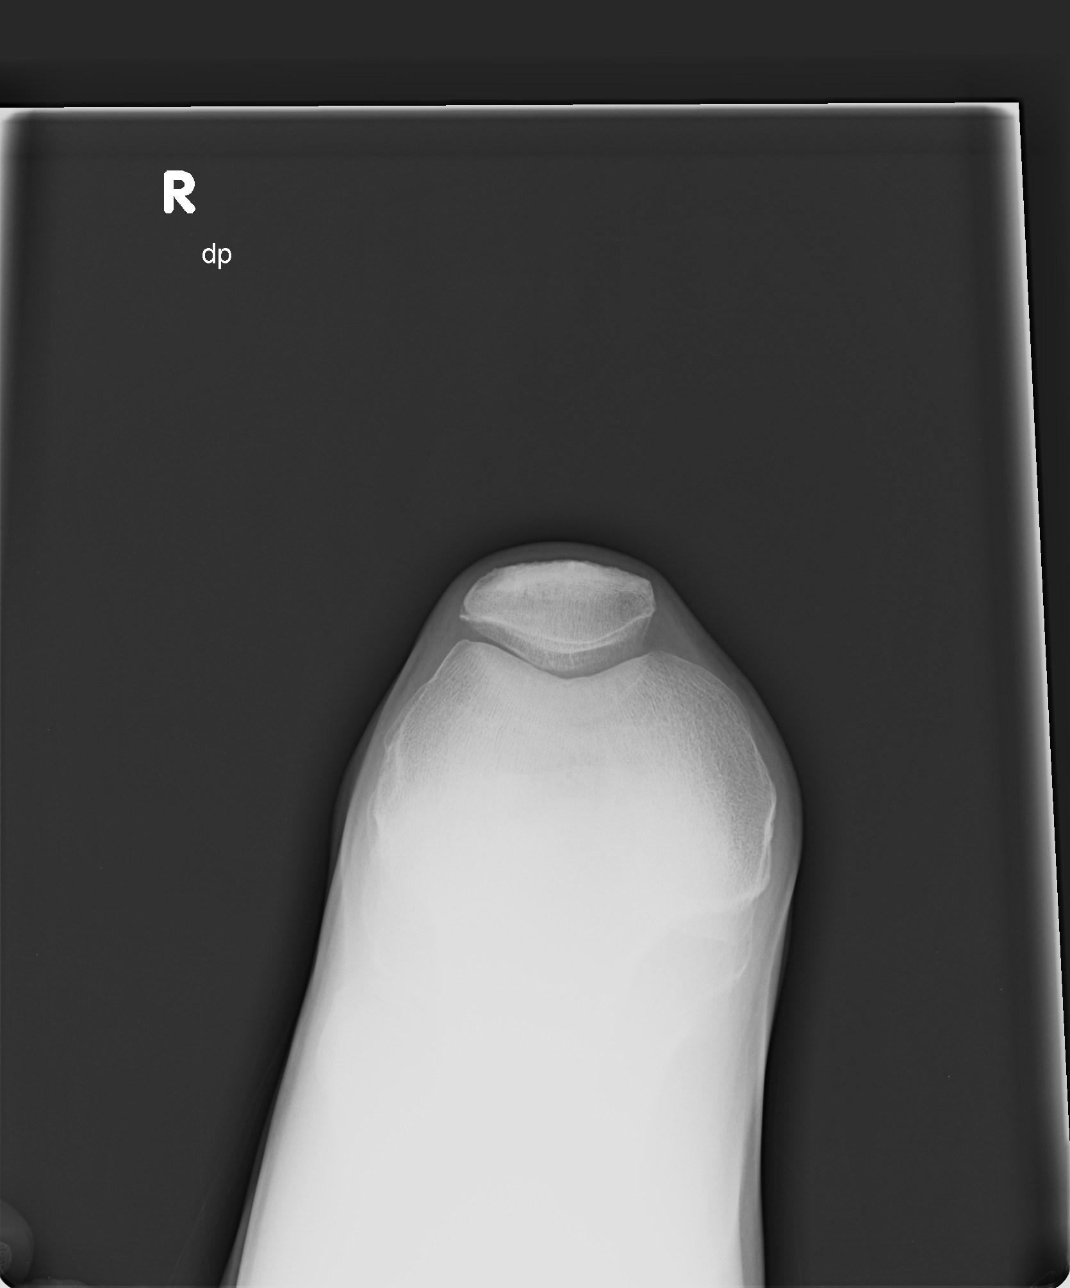

[4 of 4 positions shown; findings below may reference images not displayed]

FINDINGS: The joint spaces of the right knee are preserved. There is mild
beaking of the tibial spines. The patella is intact and
appropriately positioned. There is no joint effusion or
chondrocalcinosis. The soft tissues exhibit no evidence of edema.
IMPRESSION: There is no acute or significant chronic bony abnormality of the
right knee.

## 2015-12-14 IMAGING — CR DG HAND COMPLETE 3+V*L*
3 series · 3 of 3 positions shown · non-contrast
Comparison: None.

CLINICAL DATA: Status post fall 1 month ago with persistent
metacarpal region pain

EXAM:
LEFT HAND - COMPLETE 3+ VIEW

[x hand pa left]
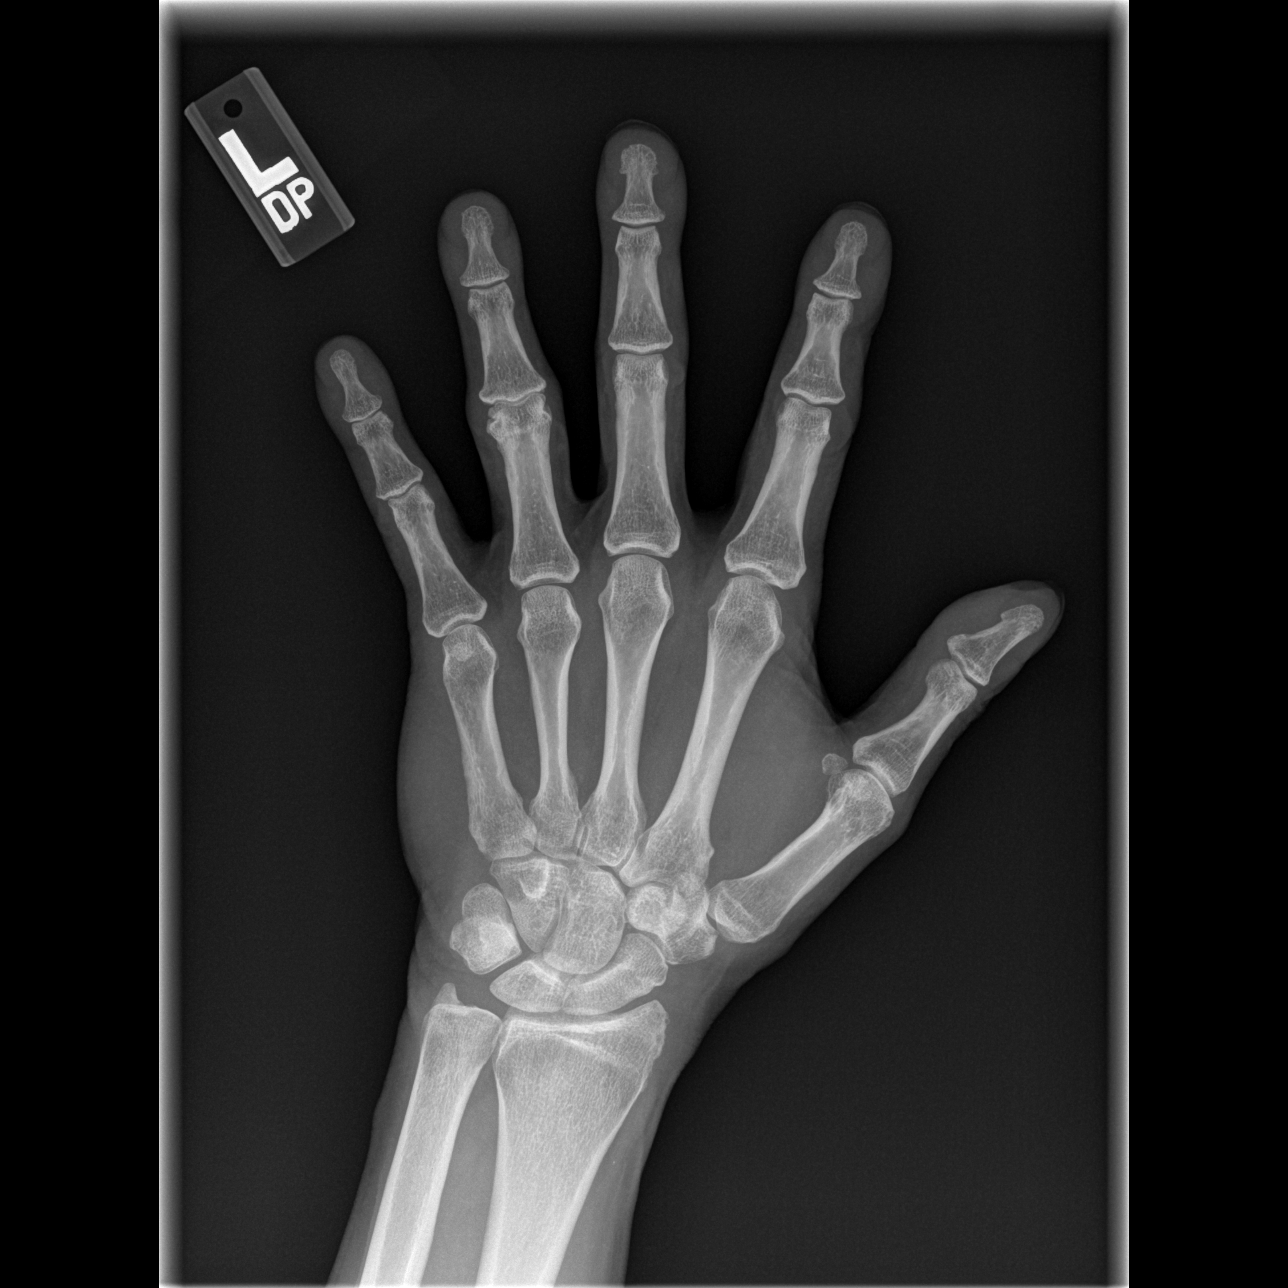

[x hand oblique left]
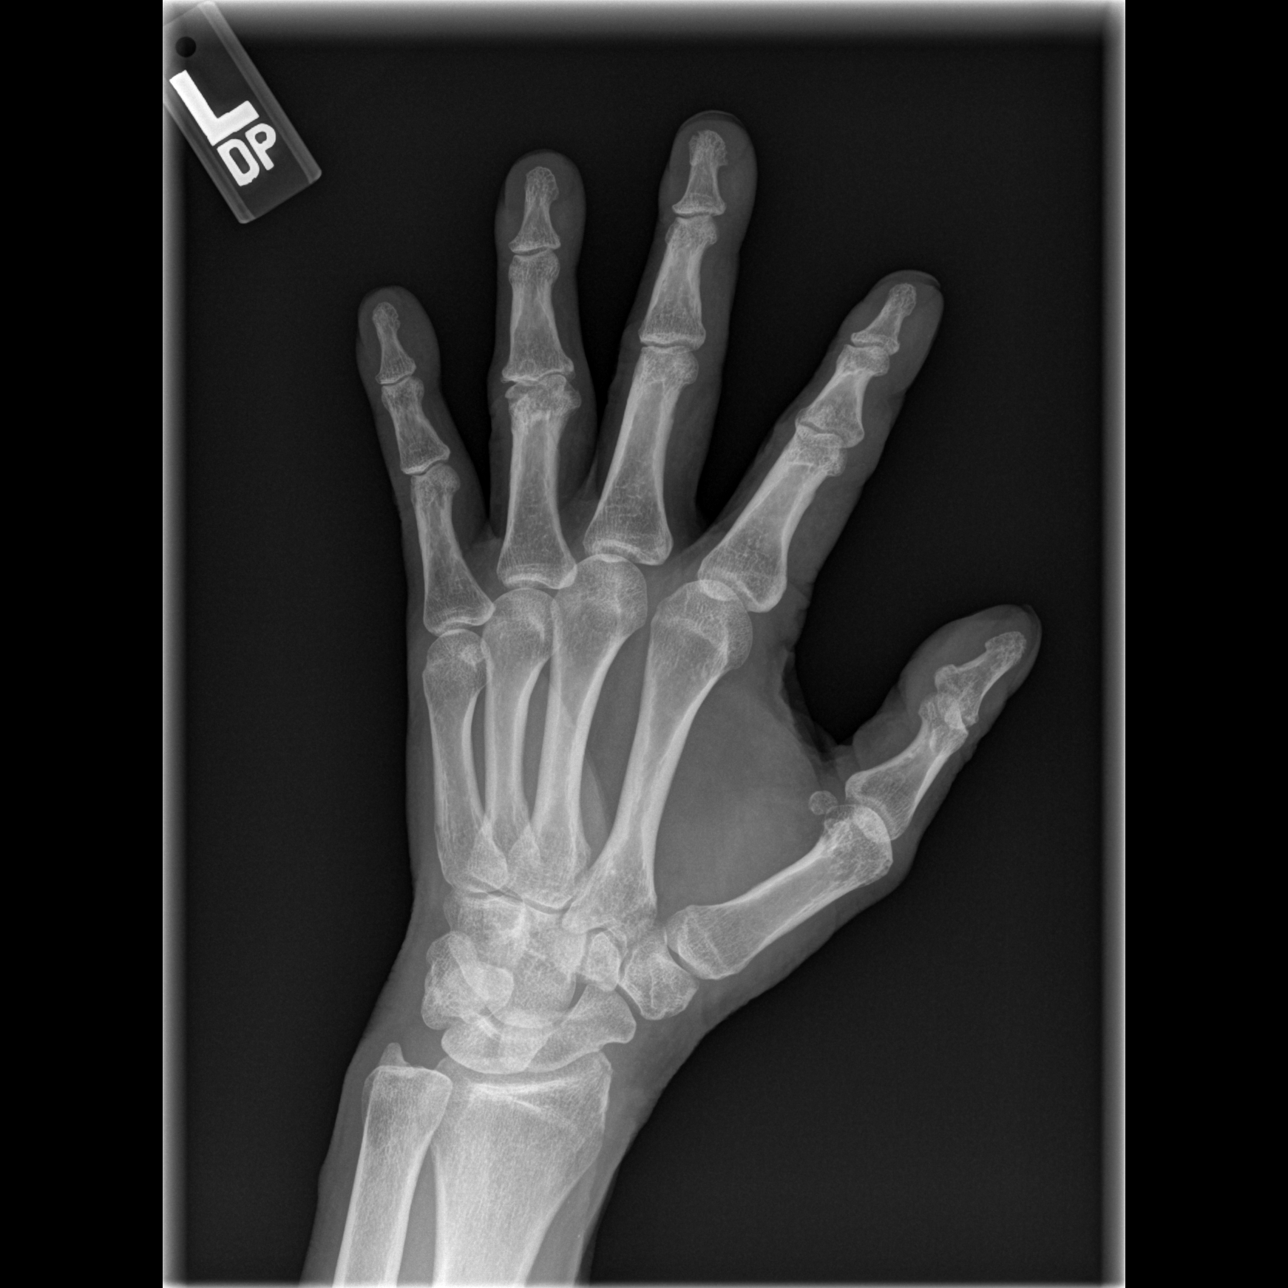

[x hand lat left]
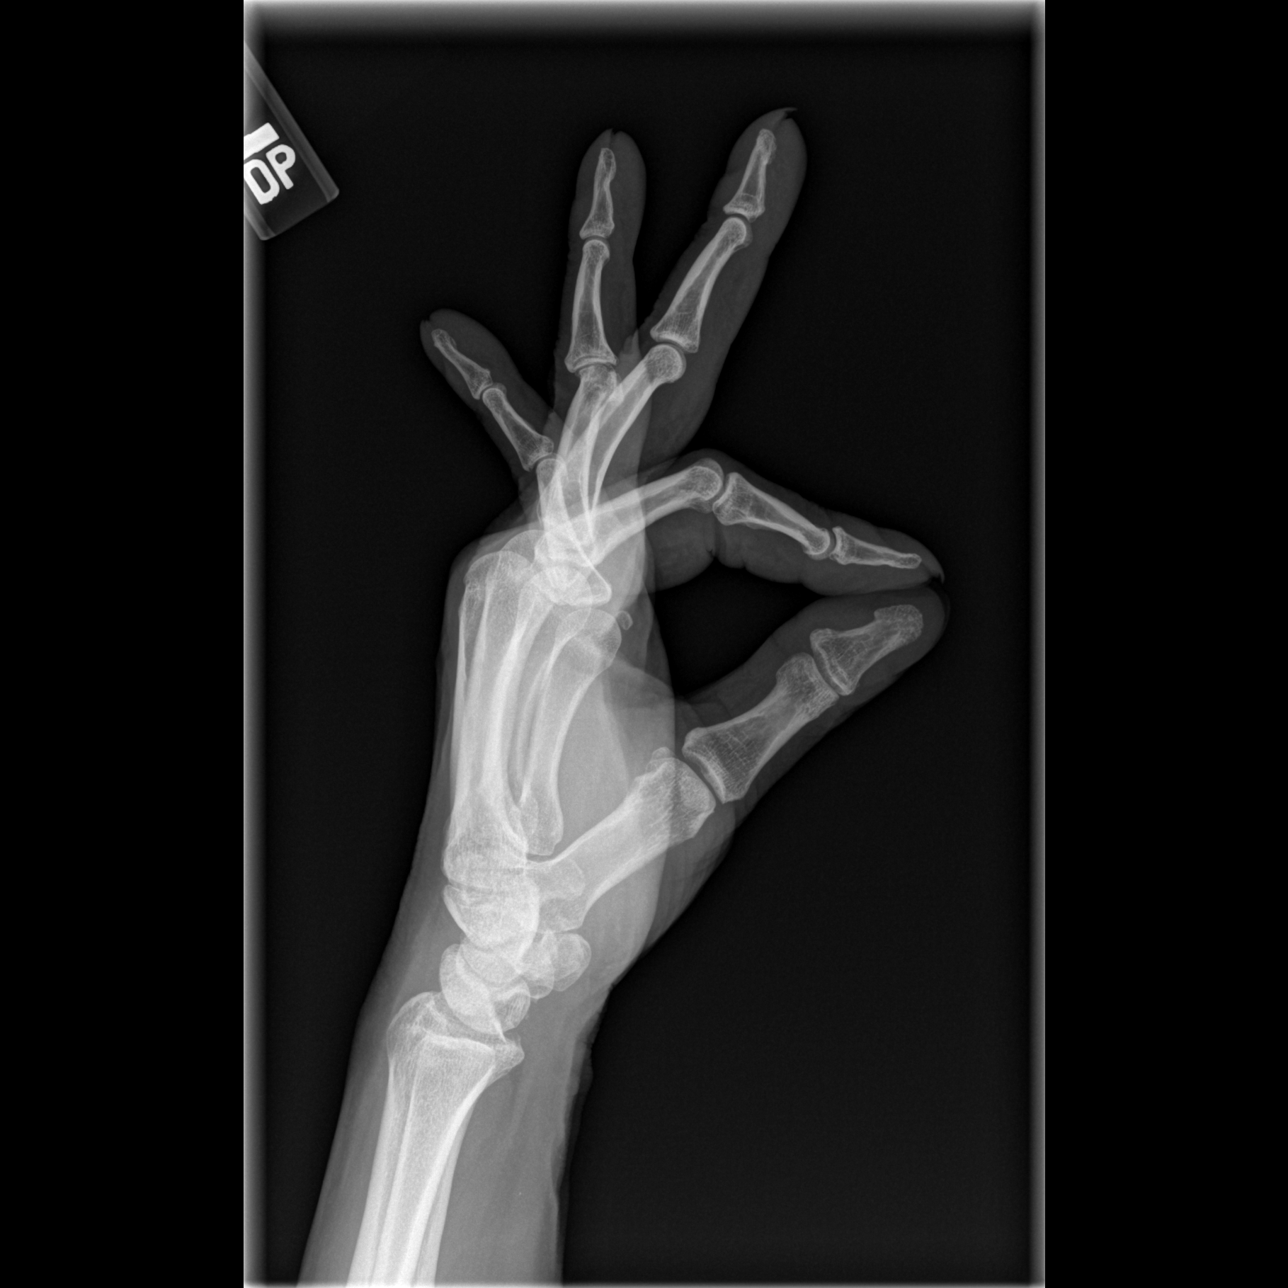

[3 of 3 positions shown; findings below may reference images not displayed]

FINDINGS: The bones of the right hand are adequately mineralized. There is
irregularity of the distal aspect of the proximal phalanx of the
fourth digit. There is mild overlying soft tissue swelling. There is
narrowing of the PIP joint of the fourth digit. The other
interphalangeal joints are unremarkable. The metacarpals and
metacarpophalangeal joints exhibit no acute abnormalities. The
observed portions of the distal radius and ulna and the carpal bones
are unremarkable.
IMPRESSION: There is soft tissue swelling and underlying irregularity of the
distal aspect of the proximal phalanx of the left fourth finger.
Correlation with symptoms here is needed. This could reflect a site
of old injury with subsequent onset of degenerative change, but a
subacute fracture is not excluded.
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
## 2016-05-13 LAB — BLOOD GAS, ARTERIAL
ACID-BASE DEFICIT: 0.4 mmol/L (ref 0.0–2.0)
BICARBONATE: 23.4 meq/L (ref 20.0–24.0)
Drawn by: 283401
O2 CONTENT: 2.5 L/min
O2 Saturation: 61.7 %
PO2 ART: 35 mmHg — AB (ref 80.0–100.0)
Patient temperature: 37
pCO2 arterial: 36.3 mmHg (ref 35.0–45.0)
pH, Arterial: 7.426 (ref 7.350–7.450)

## 2016-05-30 ENCOUNTER — Other Ambulatory Visit (HOSPITAL_COMMUNITY): Payer: Self-pay | Admitting: Pulmonary Disease

## 2016-05-30 ENCOUNTER — Ambulatory Visit (HOSPITAL_COMMUNITY)
Admission: RE | Admit: 2016-05-30 | Discharge: 2016-05-30 | Disposition: A | Discharge status: Home / Self Care | Payer: Medicare Other | Source: Ambulatory Visit | Attending: Pulmonary Disease | Admitting: Pulmonary Disease

## 2016-05-30 DIAGNOSIS — A319 Mycobacterial infection, unspecified: Secondary | ICD-10-CM

## 2016-05-30 DIAGNOSIS — J439 Emphysema, unspecified: Secondary | ICD-10-CM | POA: Diagnosis not present

## 2016-05-30 DIAGNOSIS — J8410 Pulmonary fibrosis, unspecified: Secondary | ICD-10-CM | POA: Diagnosis not present

## 2016-06-03 ENCOUNTER — Other Ambulatory Visit: Payer: Self-pay | Admitting: Adult Health
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
## 2016-07-19 IMAGING — CR DG ABD PORTABLE 1V
1 series · 1 of 1 positions shown · non-contrast
Comparison: CT abdomen pelvis 05/01/2015

CLINICAL DATA: Intractable nausea and vomiting

EXAM:
PORTABLE ABDOMEN - 1 VIEW

[AP]
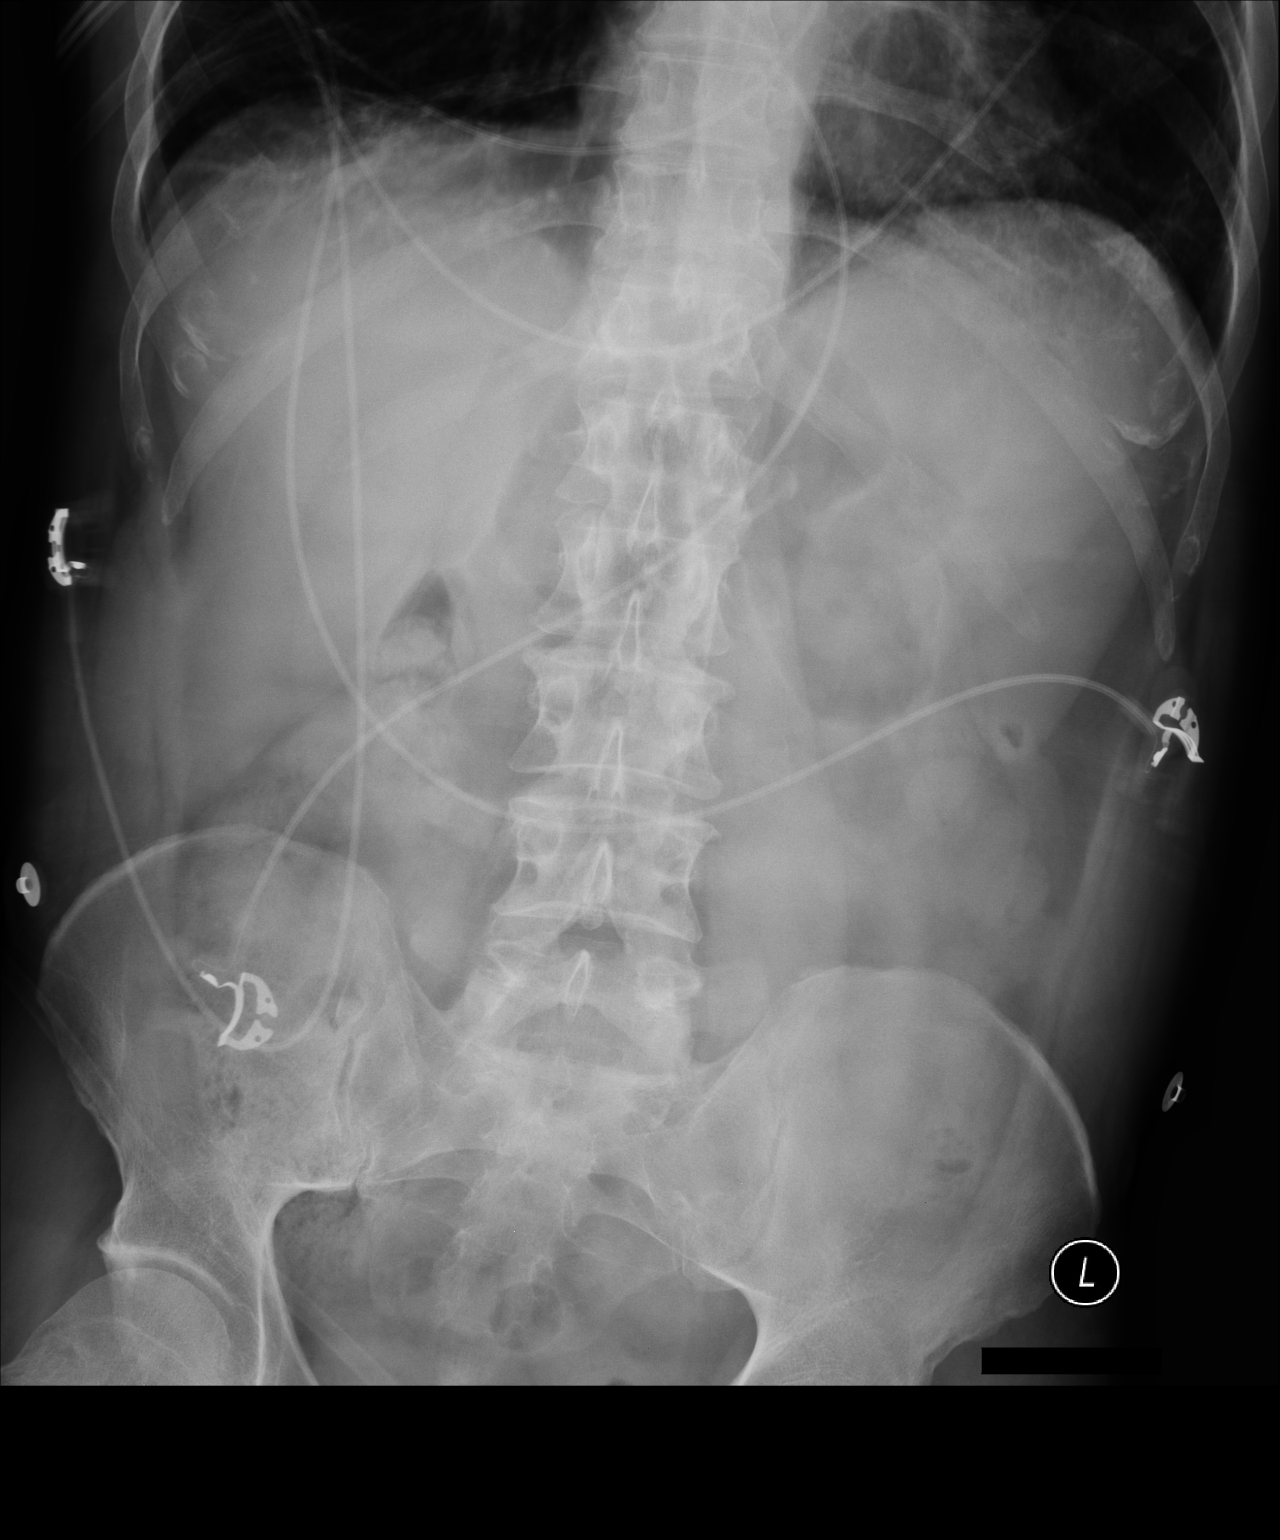

[1 of 1 positions shown; findings below may reference images not displayed]

FINDINGS: Paucity of bowel gas.

No bowel dilatation or wall thickening evident.

Lung bases hyperinflated but clear.

Bones unremarkable.

No definite urinary tract calcification.
IMPRESSION: No acute abnormalities.

## 2016-07-19 IMAGING — DX DG SHOULDER 2+V*L*
3 series · 3 of 3 positions shown · non-contrast
Comparison: None.

CLINICAL DATA: Left shoulder pain

EXAM:
LEFT SHOULDER - 2+ VIEW

[shoulder grashey]
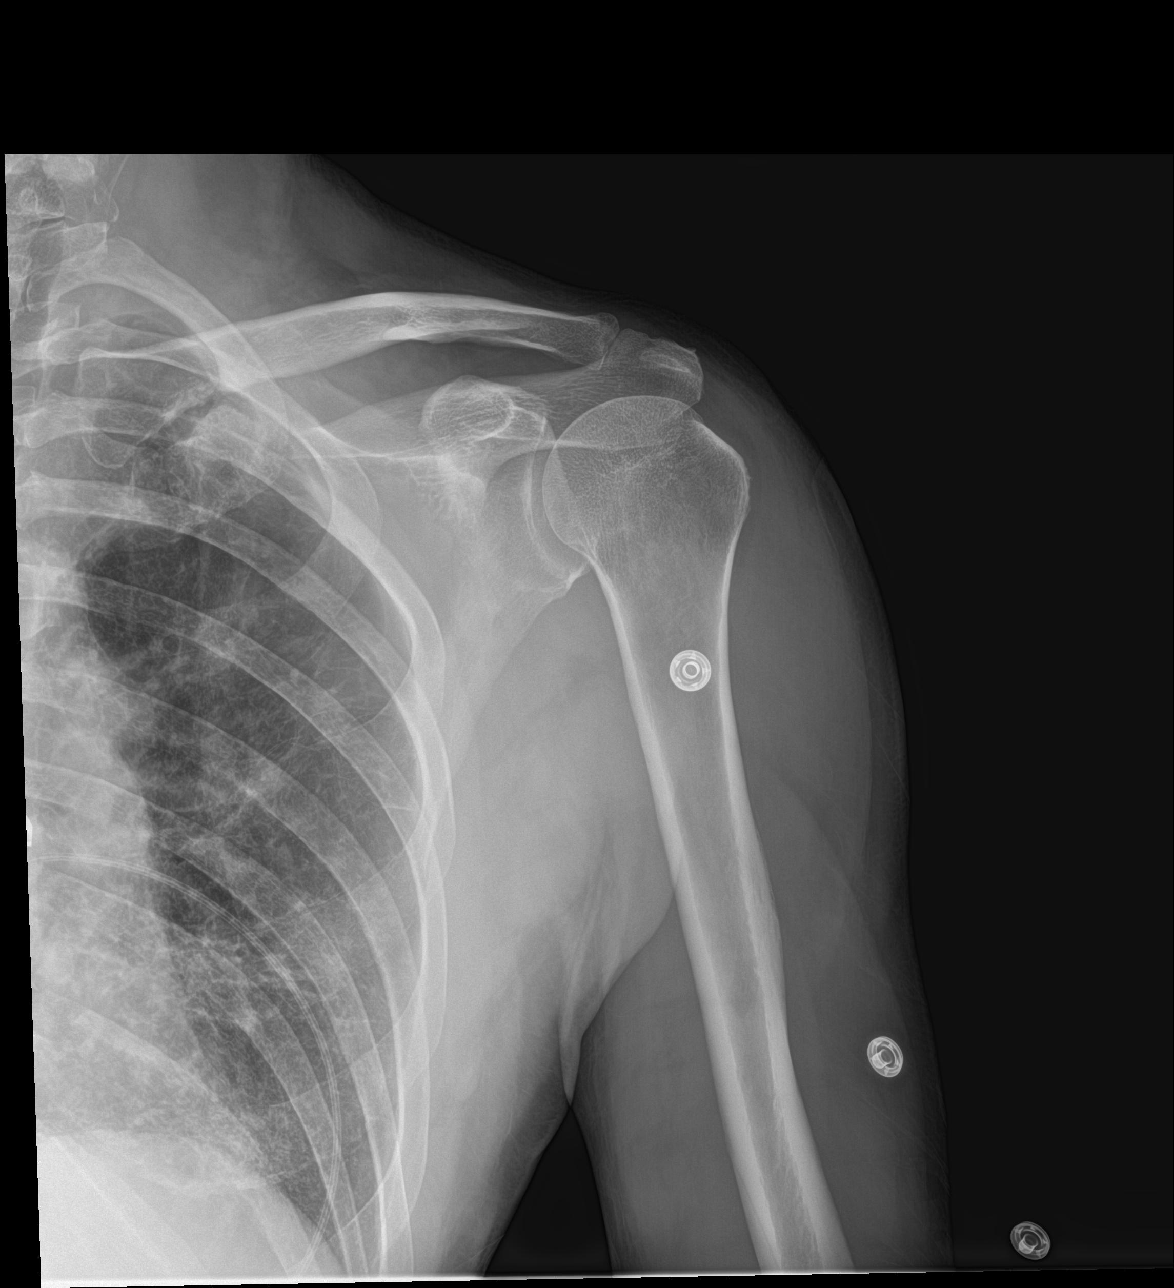

[shoulder axillary (1 of 2)]
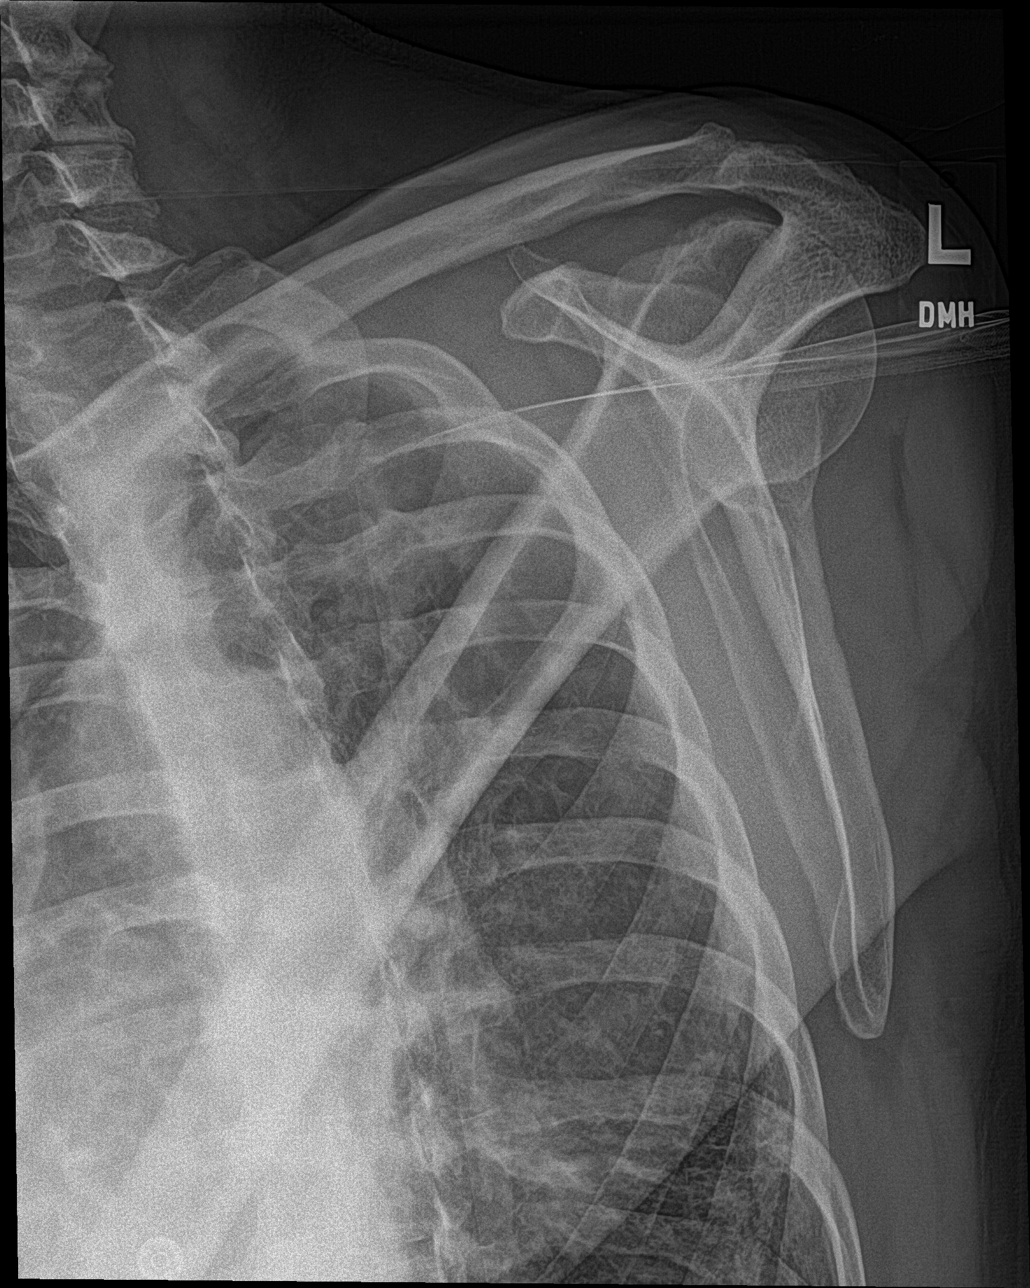

[shoulder axillary (2 of 2)]
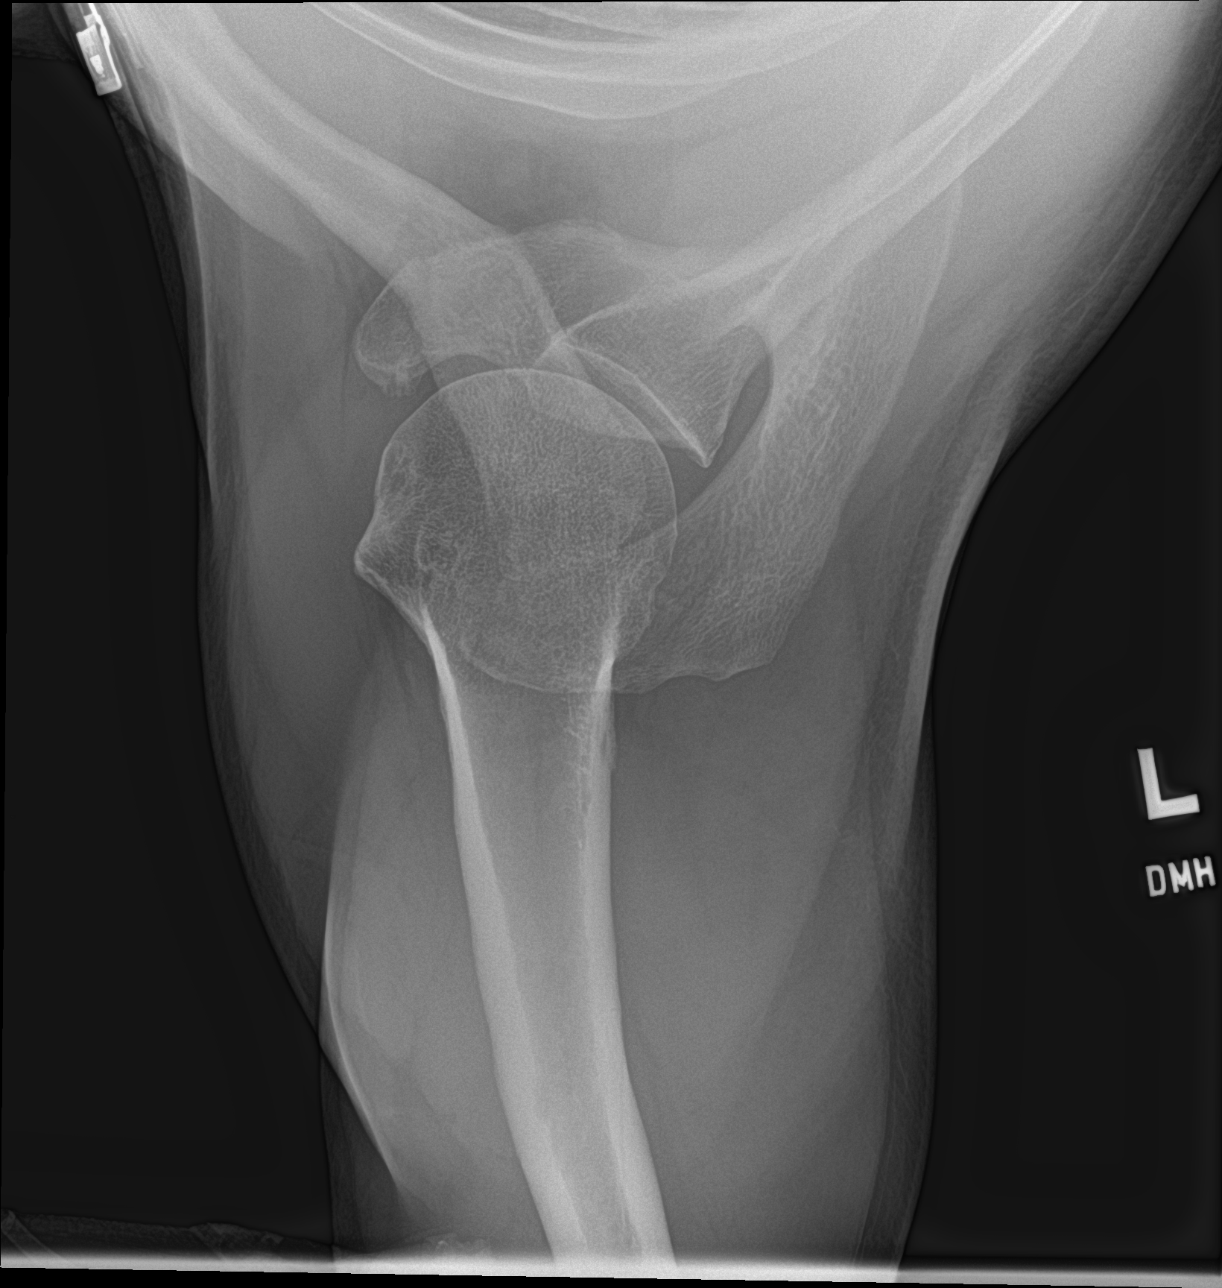

[3 of 3 positions shown; findings below may reference images not displayed]

FINDINGS: Mild joint space narrowing in the left AC joint with superior
spurring. Glenohumeral joint is intact. No acute bony abnormality.
Specifically, no fracture, subluxation, or dislocation. Soft tissues
are intact.
IMPRESSION: Mild degenerative changes in the left AC joint. No acute bony
abnormality.
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
## 2016-07-24 DIAGNOSIS — J439 Emphysema, unspecified: Secondary | ICD-10-CM | POA: Diagnosis not present

## 2016-07-24 DIAGNOSIS — J9610 Chronic respiratory failure, unspecified whether with hypoxia or hypercapnia: Secondary | ICD-10-CM | POA: Diagnosis not present

## 2016-07-24 DIAGNOSIS — R0902 Hypoxemia: Secondary | ICD-10-CM | POA: Diagnosis not present

## 2016-07-24 DIAGNOSIS — J449 Chronic obstructive pulmonary disease, unspecified: Secondary | ICD-10-CM | POA: Diagnosis not present
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
## 2016-08-07 ENCOUNTER — Other Ambulatory Visit: Payer: Self-pay | Admitting: Adult Health

## 2016-08-14 ENCOUNTER — Other Ambulatory Visit: Payer: Self-pay | Admitting: Cardiology

## 2016-08-14 MED ORDER — DILTIAZEM HCL ER COATED BEADS 120 MG PO CP24: 120.0000 mg | ORAL_CAPSULE | Freq: Every day | ORAL | 1 refills | Status: DC

## 2016-08-14 NOTE — Telephone Encounter (Signed)
Patient states his refills were denied due to needing f/u appointment. Patient scheduled for 09/24/16 (first available). Wants to know if he can have refill til then. / tg

## 2016-08-14 NOTE — Telephone Encounter (Signed)
refilled cartia at pt request

## 2016-08-24 DIAGNOSIS — J9610 Chronic respiratory failure, unspecified whether with hypoxia or hypercapnia: Secondary | ICD-10-CM | POA: Diagnosis not present

## 2016-08-24 DIAGNOSIS — J439 Emphysema, unspecified: Secondary | ICD-10-CM | POA: Diagnosis not present

## 2016-08-24 DIAGNOSIS — J449 Chronic obstructive pulmonary disease, unspecified: Secondary | ICD-10-CM | POA: Diagnosis not present

## 2016-08-24 DIAGNOSIS — R0902 Hypoxemia: Secondary | ICD-10-CM | POA: Diagnosis not present

## 2016-08-26 ENCOUNTER — Encounter: Payer: Self-pay | Admitting: Physical Medicine & Rehabilitation

## 2016-08-27 DIAGNOSIS — A318 Other mycobacterial infections: Secondary | ICD-10-CM | POA: Diagnosis not present

## 2016-08-27 DIAGNOSIS — I482 Chronic atrial fibrillation: Secondary | ICD-10-CM | POA: Diagnosis not present

## 2016-08-27 DIAGNOSIS — J449 Chronic obstructive pulmonary disease, unspecified: Secondary | ICD-10-CM | POA: Diagnosis not present

## 2016-08-27 DIAGNOSIS — J9611 Chronic respiratory failure with hypoxia: Secondary | ICD-10-CM | POA: Diagnosis not present

## 2016-09-03 ENCOUNTER — Encounter: Payer: Medicare Other | Attending: Physical Medicine & Rehabilitation | Admitting: Physical Medicine & Rehabilitation

## 2016-09-03 ENCOUNTER — Encounter: Payer: Self-pay | Admitting: Physical Medicine & Rehabilitation

## 2016-09-03 VITALS — BP 117/79 | HR 87 | Resp 14

## 2016-09-03 DIAGNOSIS — Z87891 Personal history of nicotine dependence: Secondary | ICD-10-CM | POA: Diagnosis not present

## 2016-09-03 DIAGNOSIS — M791 Myalgia, unspecified site: Secondary | ICD-10-CM

## 2016-09-03 DIAGNOSIS — J449 Chronic obstructive pulmonary disease, unspecified: Secondary | ICD-10-CM | POA: Diagnosis not present

## 2016-09-03 DIAGNOSIS — M5410 Radiculopathy, site unspecified: Secondary | ICD-10-CM | POA: Insufficient documentation

## 2016-09-03 DIAGNOSIS — E119 Type 2 diabetes mellitus without complications: Secondary | ICD-10-CM | POA: Insufficient documentation

## 2016-09-03 DIAGNOSIS — M47892 Other spondylosis, cervical region: Secondary | ICD-10-CM | POA: Diagnosis not present

## 2016-09-03 DIAGNOSIS — G8929 Other chronic pain: Secondary | ICD-10-CM | POA: Diagnosis not present

## 2016-09-03 DIAGNOSIS — M542 Cervicalgia: Secondary | ICD-10-CM

## 2016-09-03 DIAGNOSIS — Z79899 Other long term (current) drug therapy: Secondary | ICD-10-CM

## 2016-09-03 DIAGNOSIS — G479 Sleep disorder, unspecified: Secondary | ICD-10-CM | POA: Insufficient documentation

## 2016-09-03 DIAGNOSIS — R569 Unspecified convulsions: Secondary | ICD-10-CM | POA: Insufficient documentation

## 2016-09-03 DIAGNOSIS — J439 Emphysema, unspecified: Secondary | ICD-10-CM

## 2016-09-03 DIAGNOSIS — Z5181 Encounter for therapeutic drug level monitoring: Secondary | ICD-10-CM

## 2016-09-03 MED ORDER — MELOXICAM 15 MG PO TABS: 15.0000 mg | ORAL_TABLET | Freq: Every day | ORAL | 1 refills | Status: DC

## 2016-09-03 MED ORDER — GABAPENTIN 100 MG PO CAPS: 100.0000 mg | ORAL_CAPSULE | Freq: Every day | ORAL | 1 refills | Status: DC

## 2016-09-03 MED ORDER — DULOXETINE HCL 30 MG PO CPEP: 30.0000 mg | ORAL_CAPSULE | Freq: Every day | ORAL | 1 refills | Status: DC

## 2016-09-03 NOTE — Progress Notes (Signed)
Subjective:    Patient ID: Ryan [name], male    DOB: January 01, 1961, 56 y.o.   MRN: [id number]  HPI 55 y/o male with pmh of COPD on supplemental O2, DM, Cervical spondylosis, seizures, tobacco abuse, generalized pain presents with generalized > in arms. Pt presents with wife, who supplements much of the history.  Located throughout arms.  Started ~2016.  Denies inciting event.  Progressively getting worse.  Pain meds helps.  ESIs help.  Bending over exacerbates.  Associated tingling to fingers.  Also associated weakness.  Constant.  Heat helps.  Pain does not really limit activities. Denies falls.   Pain Inventory Average Pain 10 Pain Right Now 10 My pain is sharp  In the last 24 hours, has pain interfered with the following? General activity na Relation with others na Enjoyment of life na What TIME of day is your pain at its worst? morning Sleep (in general) Fair  Pain is worse with: bending and some activites Pain improves with: heat/ice, medication and injections Relief from Meds: 7  Mobility walk without assistance ability to climb steps?  yes do you drive?  yes  Function disabled: date disabled . I need assistance with the following:  bathing, meal prep, household duties and shopping  Neuro/Psych tingling spasms  Prior Studies x-rays CT/MRI nerve study new visit  Physicians involved in your care new visit   Family History  Problem Relation Age of Onset  . Hypertension Mother   . Diabetes Mother   . Heart attack Mother   . Heart failure Mother   . Hypertension Sister   . Diabetes Sister   . Heart failure Sister   . Colon cancer Neg Hx    Social History   Social History  . Marital status: Married    Spouse name: Ryan [name]  . Number of children: 4  . Years of education: 10   Social History Main Topics  . Smoking status: Former Smoker    Packs/day: 1.00    Years: 45.00    Types: Cigarettes    Quit date: 05/01/2015  . Smokeless tobacco: Never Used    . Alcohol use No  . Drug use: No  . Sexual activity: Yes    Birth control/ protection: Surgical   Other Topics Concern  . None   Social History Narrative   Patient is married Ryan [name]) and lives at home with his wife and one child.   Patient has four children.   Patient is disabled.   Patient has a high school education.   Patient is left-handed.   Patient does not drink any caffeine.   Past Surgical History:  Procedure Laterality Date  . COLONOSCOPY  2012   Dr. Posey Pronto: normal  . ESOPHAGOGASTRODUODENOSCOPY N/A 05/04/2015   Dr. Michail Sermon: minimal erosive esophagitis, small hiatal hernia. FOOD PRECLUDED A COMPLETE EXAM. No biopsies taken  . ESOPHAGOGASTRODUODENOSCOPY  2011   [place]: Barrett's   . LUNG BIOPSY    . Throat biopsy    . VASECTOMY    . VASECTOMY  1987   Past Medical History:  Diagnosis Date  . Atrial fibrillation ([place])    Not anticoagulated  . Barrett's esophagus   . Borderline diabetes   . Bronchitis   . CAP (community acquired pneumonia) 07/10/2013   04/2015  . 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 Chronic back pain   . Chronic left shoulder pain   . Chronic neck pain   . Chronic respiratory  failure ([place])   . Collagen vascular disease ([place])   . COPD (chronic obstructive pulmonary disease) ([place])   . Coronary atherosclerosis of native coronary artery    Mild nonobstructive disease at catheterization 2007  . DDD (degenerative disc disease)    Cervical and thoracic  . GERD (gastroesophageal reflux disease)   . History of pneumonia   . Hypercholesteremia   . Polycythemia   . Seizures ([place])    Last seizure 2 yrs ago  . Smoker   . Type 2 diabetes mellitus ([place])   . Type 2 diabetes mellitus without complication (HCC)    BP 117/79   Pulse 87   Resp 14   SpO2 97%   Opioid Risk Score:   Fall Risk Score:  `1  Depression screen PHQ 2/9  Depression screen PHQ 2/9 09/03/2016  Decreased Interest 0  Down, Depressed,  Hopeless 0  PHQ - 2 Score 0  Altered sleeping 3  Tired, decreased energy 3  Change in appetite 0  Feeling bad or failure about yourself  0  Trouble concentrating 0  Moving slowly or fidgety/restless 0  Suicidal thoughts 0  PHQ-9 Score 6  Difficult doing work/chores Not difficult at all    Review of Systems  Constitutional: Negative.   HENT: Negative.   Eyes: Negative.   Respiratory: Positive for cough, shortness of breath and wheezing.        Respiratory infection  Gastrointestinal: Negative.   Endocrine:       High blood sugar  Musculoskeletal: Positive for back pain.       Spasms  Skin: Negative.   Allergic/Immunologic: Negative.   Neurological:       Tingling  Hematological: Negative.   Psychiatric/Behavioral: Negative.   All other systems reviewed and are negative.      Objective:   Physical Exam Gen: NAD. Vital signs reviewed HENT: Normocephalic, Atraumatic Eyes: EOMI. No discharge.  Cardio: RRR. No JVD. Pulm: B/l clear to auscultation.  Effort normal Abd:  Soft, BS+ MSK:  Gait WNL.   TTP along cervical and thoracic PSPs.    No edema.  Neuro: CN II-XII grossly intact.    Sensation intact to light touch in all UE dermatomes  Reflexes 2+ throughout  Strength  4/5 in all UE myotomes  Neg hoffman's  Neg Spurling's Skin: Warm and Dry. Intact Psych: Normal mood and behavior.    Assessment & Plan:  56 y/o male with pmh of COPD on supplemental O2, DM, Cervical spondylosis, seizures, tobacco abuse, generalized pain presents with generalized > in arms.    1. Chronic mechanical back pain with radiculopathy  CT from 04/2015 reviewed, revealing showing relatively normal C-spine  Labs reviewed  Referral information reviewed  [place] reviewed  UDS performed  Cont Heat  Pt has never had PT, will refer for eval and treat of back/neck pain with consider of traction and TENS  PCP started Baclofen, recently increased to 10 TID  Will trial Gabapentin 100qhs  Will  trial Cymbalta 30 and consider increase to 60 on next visit.   Will order Mobic '15mg'$  daily with food  Will consider referral to Psychology  Will consider Voltaren gel/Lidoderm  Will consider Robaxin   Will be mindful in prescribed respiratory depressant meds due to severe COPD.  Will wean Percocet first and then Lorazepam.  Pt states he has 2 month supply, encouraged to wean.   2. Sleep disturbance  Will order Elavil '10mg'$   3. Myalgia   Will consider Trigger point injections  in future  4. Emphysema- severe  Severe with limited mobility  See #1

## 2016-09-03 NOTE — Addendum Note (Signed)
Addended by: Geryl Rankins D on: 09/03/2016 03:44 PM   Modules accepted: Orders
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
## 2016-09-11 LAB — TOXASSURE SELECT,+ANTIDEPR,UR

## 2016-09-16 ENCOUNTER — Ambulatory Visit (HOSPITAL_COMMUNITY): Payer: Medicare Other | Attending: Physical Medicine & Rehabilitation

## 2016-09-17 IMAGING — DX DG CHEST 2V
2 series · 2 of 2 positions shown · non-contrast
Comparison: 05/07/2015 chest and 05/01/2015 CT chest.

CLINICAL DATA: Shortness of breath. Left-sided chest pain and cough
for 3 days. History of diabetes, COPD, emphysema, GERD, bronchitis,
community acquired pneumonia, coronary atherosclerosis, and smoker.

EXAM:
CHEST  2 VIEW

[chest pa]
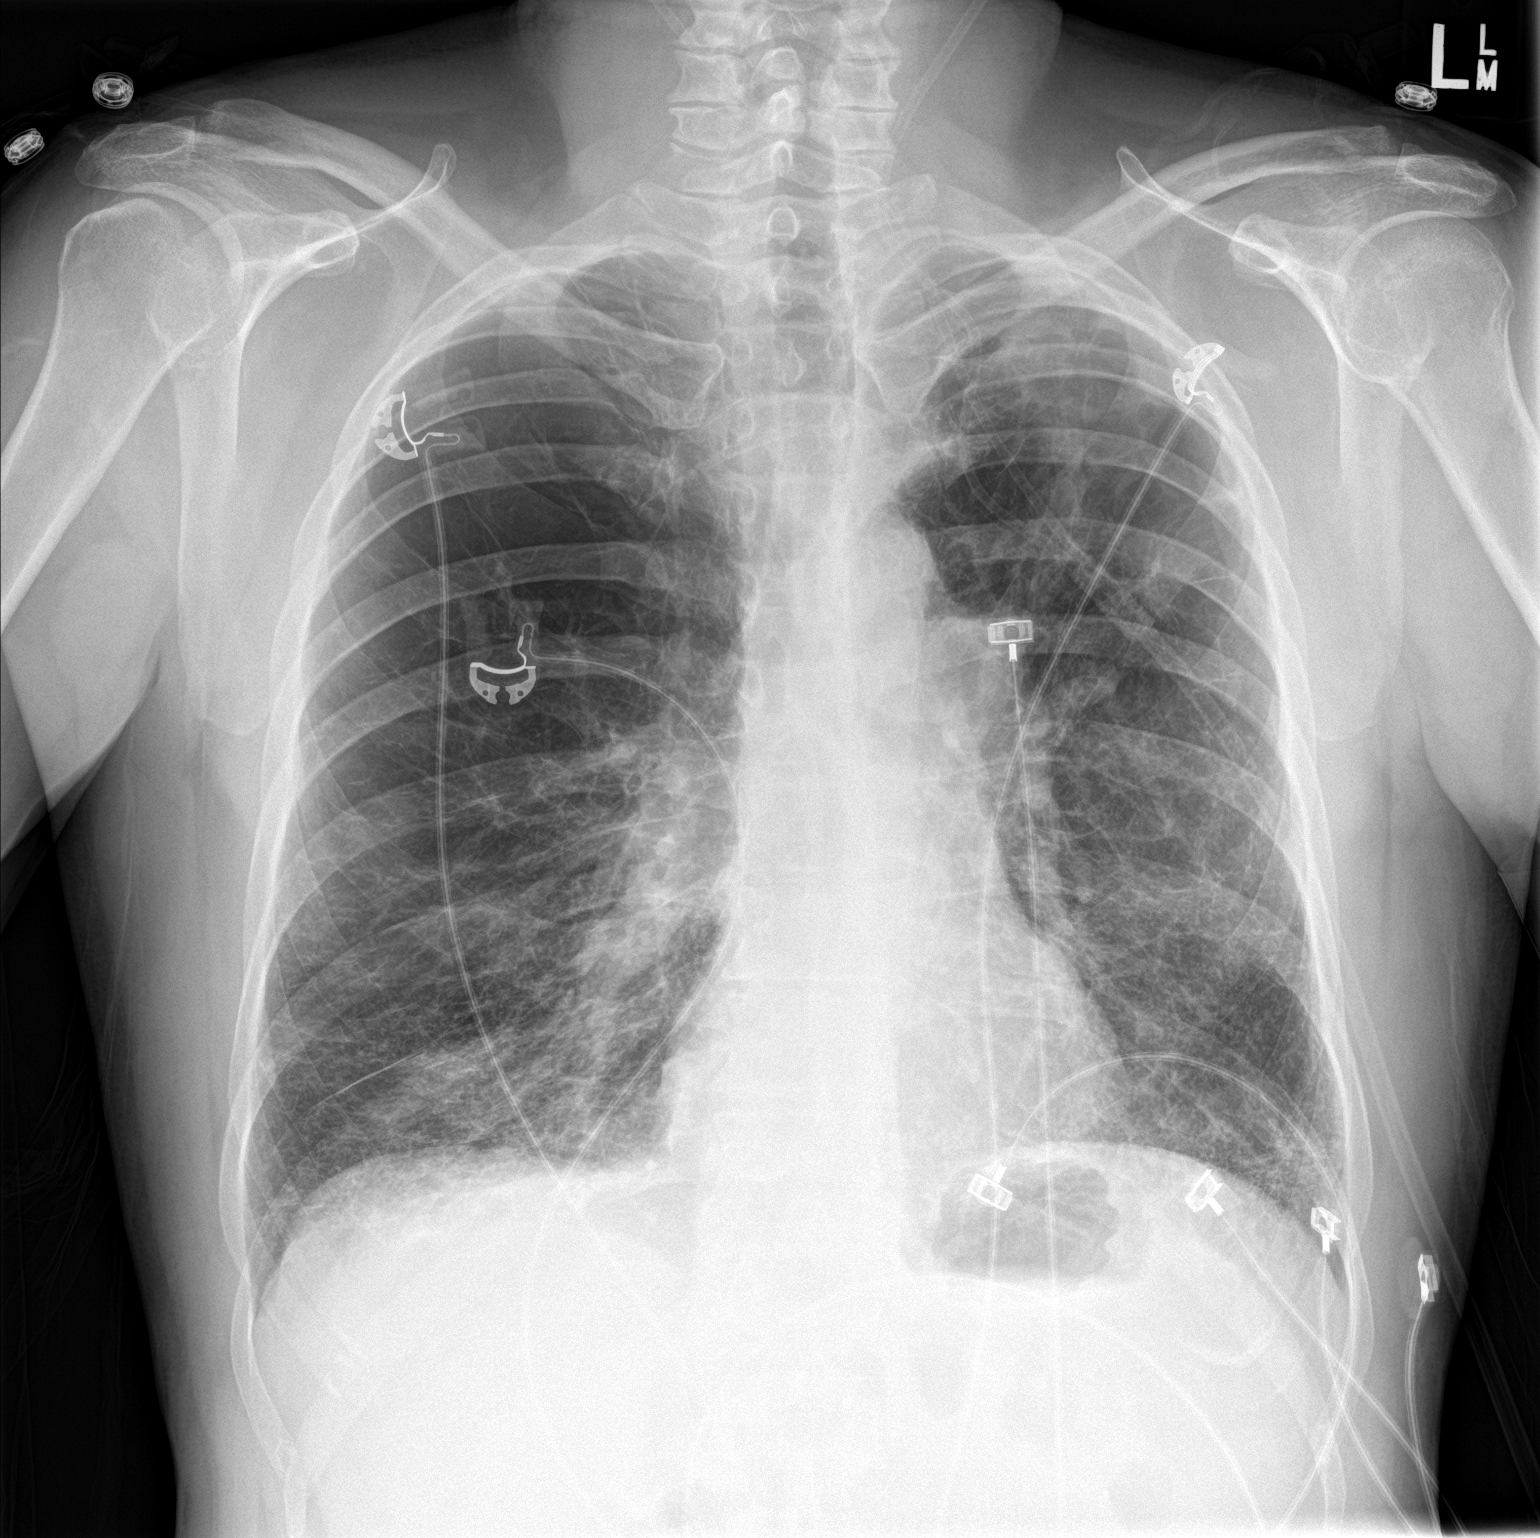

[chest lat]
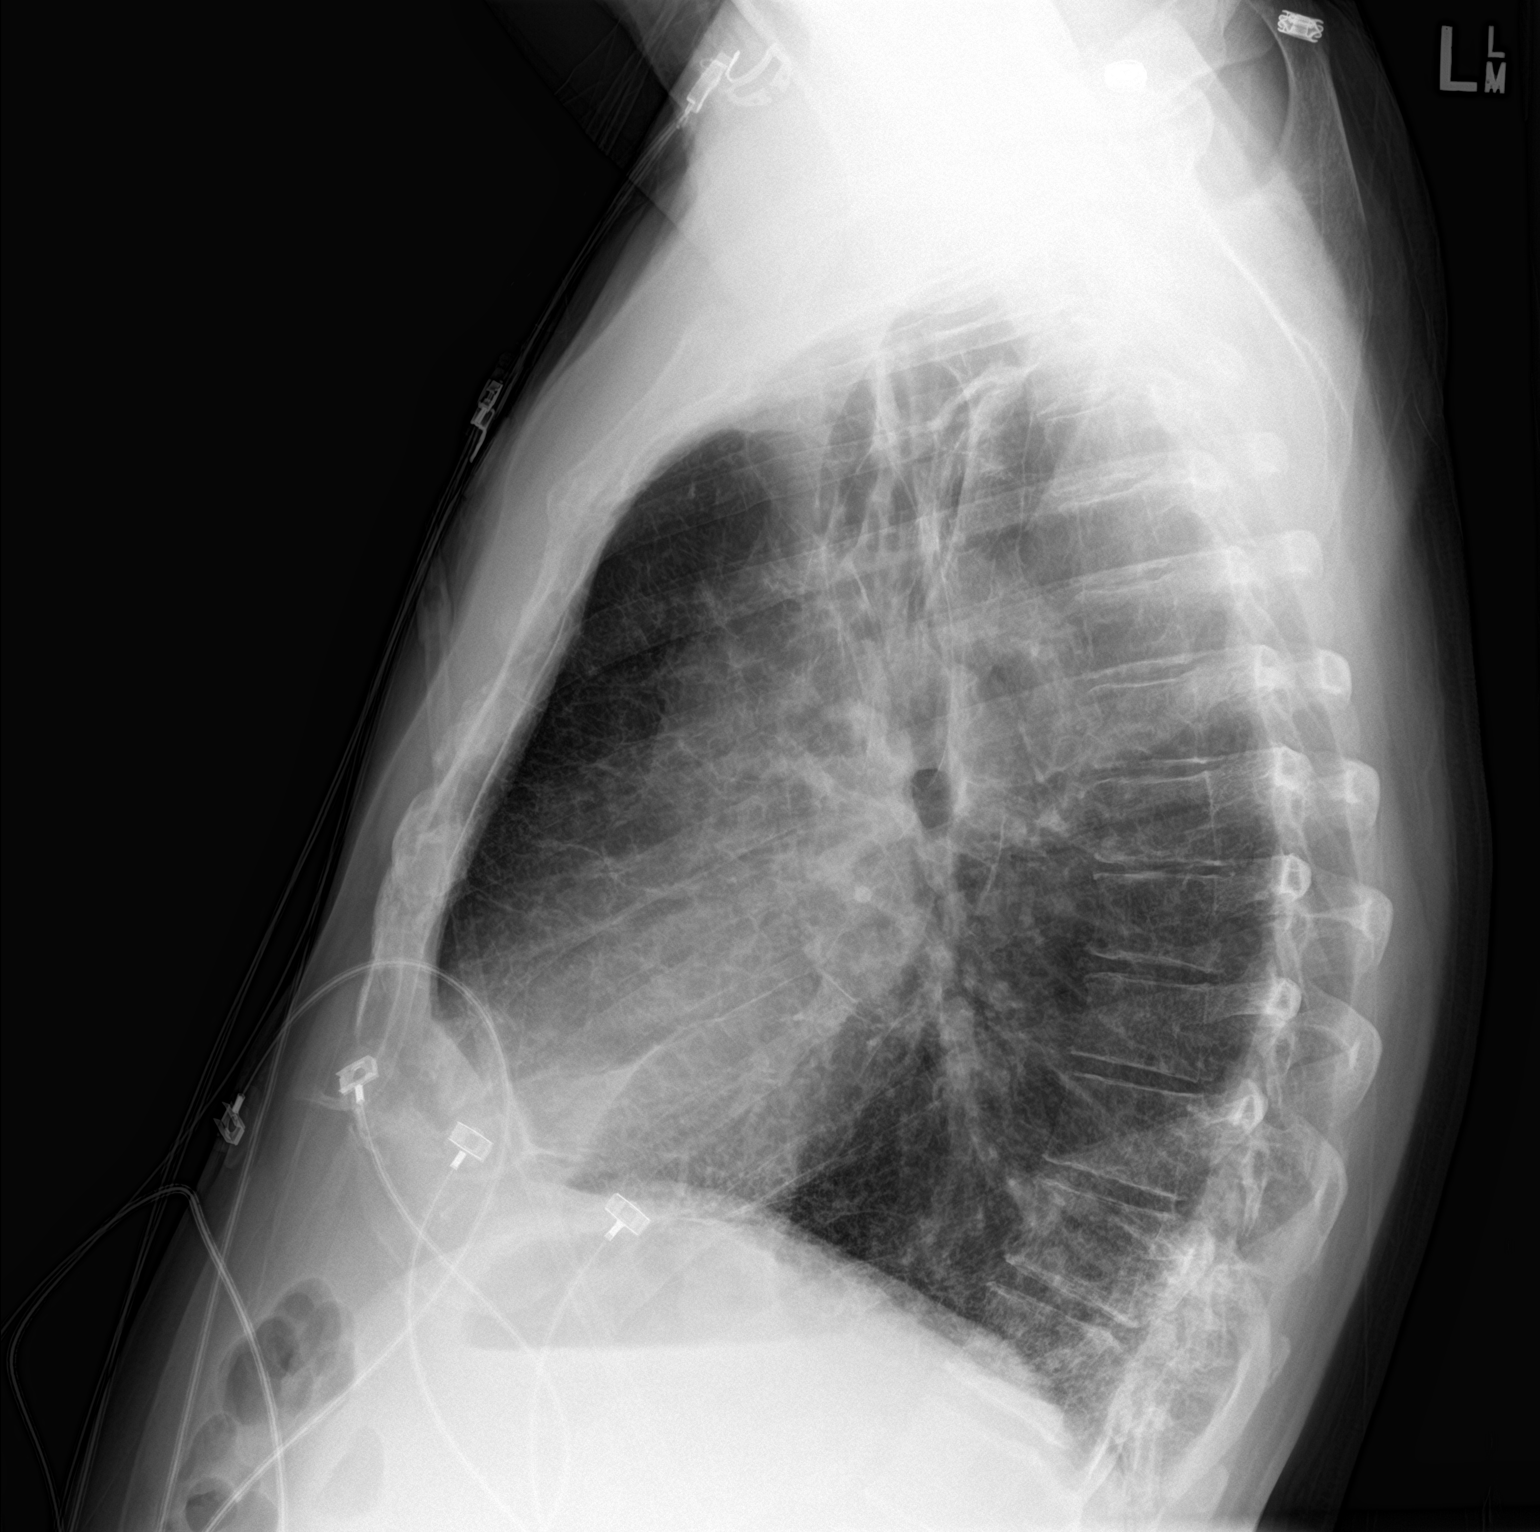

[2 of 2 positions shown; findings below may reference images not displayed]

FINDINGS: Normal heart size and pulmonary vascularity. Emphysematous changes
in the lungs with diffuse interstitial fibrosis. Scarring and
cavitary changes seen previously in the left upper lung are
decreased in prominence on today's chest radiograph. No developing
consolidation or edema. No blunting of costophrenic angles. No
pneumothorax. Mild degenerative changes in the spine.
IMPRESSION: Emphysematous changes and fibrosis throughout the lungs. Scarring
and cavitary changes previously seen in the left upper lung are less
prominent on today's study, possibly due to differences in
technique. No developing consolidation or edema.

## 2016-09-21 DIAGNOSIS — R0902 Hypoxemia: Secondary | ICD-10-CM | POA: Diagnosis not present

## 2016-09-21 DIAGNOSIS — J9610 Chronic respiratory failure, unspecified whether with hypoxia or hypercapnia: Secondary | ICD-10-CM | POA: Diagnosis not present

## 2016-09-21 DIAGNOSIS — J439 Emphysema, unspecified: Secondary | ICD-10-CM | POA: Diagnosis not present

## 2016-09-21 DIAGNOSIS — J449 Chronic obstructive pulmonary disease, unspecified: Secondary | ICD-10-CM | POA: Diagnosis not present

## 2016-09-24 ENCOUNTER — Ambulatory Visit (INDEPENDENT_AMBULATORY_CARE_PROVIDER_SITE_OTHER): Payer: Medicare Other | Admitting: Cardiology

## 2016-09-24 ENCOUNTER — Encounter: Payer: Self-pay | Admitting: Cardiology

## 2016-09-24 VITALS — BP 122/72 | HR 93 | Ht 68.0 in | Wt 147.0 lb

## 2016-09-24 DIAGNOSIS — J449 Chronic obstructive pulmonary disease, unspecified: Secondary | ICD-10-CM | POA: Diagnosis not present

## 2016-09-24 DIAGNOSIS — I482 Chronic atrial fibrillation, unspecified: Secondary | ICD-10-CM

## 2016-09-24 DIAGNOSIS — E119 Type 2 diabetes mellitus without complications: Secondary | ICD-10-CM | POA: Diagnosis not present

## 2016-09-24 DIAGNOSIS — R0789 Other chest pain: Secondary | ICD-10-CM

## 2016-09-24 NOTE — Progress Notes (Signed)
Clinical Summary Ryan [name] is a 56 y.o.male last seen by NP Purcell Nails, this is our first visit together. He is seen for the following medical problems.   1. Afib - denies any palpitations  compliant with meds. - off anticoagulation for 1 year. He is not sure why it was stopped, he believes due to "low blood counts"   2. CAD - mild left sided chest pain. Mainly occurs at rest. Sharp pain. Worst with movement, worst with coughing. Can last several hours in a row.   3. COPD - on home O2 at 6 liters - followed by Dr Luan Pulling.   4. DM2 - followed by pcp   Past Medical History:  Diagnosis Date  . Atrial fibrillation ([place])    Not anticoagulated  . Barrett's esophagus   . Borderline diabetes   . Bronchitis   . CAP (community acquired pneumonia) 07/10/2013   04/2015  . 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 Chronic back pain   . Chronic left shoulder pain   . Chronic neck pain   . Chronic respiratory failure ([place])   . Collagen vascular disease ([place])   . COPD (chronic obstructive pulmonary disease) ([place])   . Coronary atherosclerosis of native coronary artery    Mild nonobstructive disease at catheterization 2007  . DDD (degenerative disc disease)    Cervical and thoracic  . GERD (gastroesophageal reflux disease)   . History of pneumonia   . Hypercholesteremia   . Polycythemia   . Seizures ([place])    Last seizure 2 yrs ago  . Smoker   . Type 2 diabetes mellitus ([place])   . Type 2 diabetes mellitus without complication (HCC)      Allergies  Allergen Reactions  . Albuterol Palpitations  . Influenza Vaccine Live Swelling     Current Outpatient Prescriptions  Medication Sig Dispense Refill  . albuterol (VENTOLIN HFA) 108 (90 BASE) MCG/ACT inhaler Inhale 2 puffs into the lungs every 6 (six) hours as needed for wheezing or shortness of breath.    . baclofen (LIORESAL) 10 MG tablet Take 10 mg by mouth 3 (three) times daily.     .  benzonatate (TESSALON) 200 MG capsule Take 200 mg by mouth 3 (three) times daily.    [name] diltiazem (CARTIA XT) 120 MG 24 hr capsule Take 1 capsule (120 mg total) by mouth daily. 30 capsule 1  . DULoxetine (CYMBALTA) 30 MG capsule Take 1 capsule (30 mg total) by mouth daily. 30 capsule 1  . ethambutol (MYAMBUTOL) 400 MG tablet Take 2.5 tablets (1,000 mg total) by mouth daily. 100 tablet 5  . fluticasone (FLONASE) 50 MCG/ACT nasal spray Place 2 sprays into both nostrils daily as needed for allergies.     [name] gabapentin (NEURONTIN) 100 MG capsule Take 1 capsule (100 mg total) by mouth at bedtime. 30 capsule 1  . levalbuterol (XOPENEX) 0.63 MG/3ML nebulizer solution Take 3 mLs (0.63 mg total) by nebulization every 8 (eight) hours as needed for wheezing or shortness of breath. 3 mL 12  . LORazepam (ATIVAN) 0.5 MG tablet Take 1 tablet (0.5 mg total) by mouth 2 (two) times daily as needed for anxiety or sleep. (Patient taking differently: Take 0.5 mg by mouth 4 (four) times daily as needed for anxiety or sleep. ) 20 tablet 0  . meloxicam (MOBIC) 15 MG tablet Take 1 tablet (15 mg total) by mouth daily. 30 tablet 1  . metFORMIN (GLUCOPHAGE) 500  MG tablet Take 1 tablet (500 mg total) by mouth daily with breakfast. 30 tablet 3  . montelukast (SINGULAIR) 10 MG tablet Take 10 mg by mouth at bedtime.    . nitroGLYCERIN (NITROSTAT) 0.4 MG SL tablet Place 1 tablet (0.4 mg total) under the tongue every 5 (five) minutes as needed for chest pain. (Patient not taking: Reported on 09/03/2016) 25 tablet 4  . omeprazole (PRILOSEC) 20 MG capsule Take 20 mg by mouth daily.    [name] oxyCODONE-acetaminophen (PERCOCET) 10-325 MG tablet Take 1 tablet by mouth every 4 (four) hours as needed for pain.    . rifampin (RIFADIN) 300 MG capsule Take 2 capsules (600 mg total) by mouth daily. 60 capsule 5  . SPIRIVA RESPIMAT 2.5 MCG/ACT AERS Inhale 1 puff into the lungs daily.     No current facility-administered medications for this visit.       Past Surgical History:  Procedure Laterality Date  . COLONOSCOPY  2012   Dr. Posey Pronto: normal  . ESOPHAGOGASTRODUODENOSCOPY N/A 05/04/2015   Dr. Michail Sermon: minimal erosive esophagitis, small hiatal hernia. FOOD PRECLUDED A COMPLETE EXAM. No biopsies taken  . ESOPHAGOGASTRODUODENOSCOPY  2011   [facility]: Barrett's   . LUNG BIOPSY    . Throat biopsy    . VASECTOMY    . VASECTOMY  1987     Allergies  Allergen Reactions  . Albuterol Palpitations  . Influenza Vaccine Live Swelling      Family History  Problem Relation Age of Onset  . Hypertension Mother   . Diabetes Mother   . Heart attack Mother   . Heart failure Mother   . Hypertension Sister   . Diabetes Sister   . Heart failure Sister   . Colon cancer Neg Hx      Social History Ryan [name] reports that he quit smoking about 16 months ago. His smoking use included Cigarettes. He has a 45.00 pack-year smoking history. He has never used smokeless tobacco. Ryan [name] reports that he does not drink alcohol.   Review of Systems CONSTITUTIONAL: No weight loss, fever, chills, weakness or fatigue.  HEENT: Eyes: No visual loss, blurred vision, double vision or yellow sclerae.No hearing loss, sneezing, congestion, runny nose or sore throat.  SKIN: No rash or itching.  CARDIOVASCULAR: per hpi RESPIRATORY: No shortness of breath, cough or sputum.  GASTROINTESTINAL: No anorexia, nausea, vomiting or diarrhea. No abdominal pain or blood.  GENITOURINARY: No burning on urination, no polyuria NEUROLOGICAL: No headache, dizziness, syncope, paralysis, ataxia, numbness or tingling in the extremities. No change in bowel or bladder control.  MUSCULOSKELETAL: No muscle, back pain, joint pain or stiffness.  LYMPHATICS: No enlarged nodes. No history of splenectomy.  PSYCHIATRIC: No history of depression or anxiety.  ENDOCRINOLOGIC: No reports of sweating, cold or heat intolerance. No polyuria or polydipsia.  [name]   Physical  Examination Vitals:   09/24/16 1544  BP: 122/72  Pulse: 93   Vitals:   09/24/16 1544  Weight: 147 lb (66.7 kg)  Height: '5\' 8"'$  (1.727 m)    Gen: resting comfortably, no acute distress HEENT: no scleral icterus, pupils equal round and reactive, no palptable cervical adenopathy,  CV: RRR, no m/r/g, no jvd Resp: Clear to auscultation bilaterally GI: abdomen is soft, non-tender, non-distended, normal bowel sounds, no hepatosplenomegaly MSK: extremities are warm, no edema.  Skin: warm, no rash Neuro:  no focal deficits Psych: appropriate affect   Diagnostic Studies 08/2015 echo Study Conclusions  - Left ventricle: The cavity size was normal.  Wall thickness was   normal. Systolic function was vigorous. The estimated ejection   fraction was in the range of 65% to 70%. Wall motion was normal;   there were no regional wall motion abnormalities. Left   ventricular diastolic function parameters were normal. - Mitral valve: There was moderate regurgitation. - Tricuspid valve: There was mild regurgitation.      Assessment and Plan  1. Afib - no recent symptoms - remains off anticoagulation. This history remains unclear to me, will review further and see if able to restart  2. Chest pain - atypical, noncardiac in description.  - continue to monitor  3. Severe COPD - continue home O2  4. DM2 - request labs from pcp. He has been on metformin.       Arnoldo Lenis, M.D.

## 2016-09-24 NOTE — Patient Instructions (Signed)
Medication Instructions:  Your physician recommends that you continue on your current medications as directed. Please refer to the Current Medication list given to you today.   Labwork: I WILL REQUEST LABS FROM PCP.  Testing/Procedures: NONE  Follow-Up: Your physician wants you to follow-up in: 6 MONTHS.  You will receive a reminder letter in the mail two months in advance. If you don't receive a letter, please call our office to schedule the follow-up appointment.   Any Other Special Instructions Will Be Listed Below (If Applicable).     If you need a refill on your cardiac medications before your next appointment, please call your pharmacy.   

## 2016-10-01 ENCOUNTER — Encounter: Payer: Medicare Other | Admitting: Physical Medicine & Rehabilitation

## 2016-10-01 IMAGING — DX DG CHEST 2V
5 series · 5 of 5 positions shown · non-contrast
Comparison: Chest radiograph 07/01/2015

CLINICAL DATA: SOB, WEAK, Pt on O2 at night, pt has increased sob,
cough with thick brown, clear sputum, PATIENT STATES " HE IS ALSO
HAVING REALLY BAD LEG SPASMS LATELY" HISTORY OF DIABETES, PNEUMONIA,
ATRIAL FIB, SEIZURES, SMOKER, CAD, CHRONIC RESPIRATORY FAILURE

EXAM:
CHEST  2 VIEW

[chest pa]
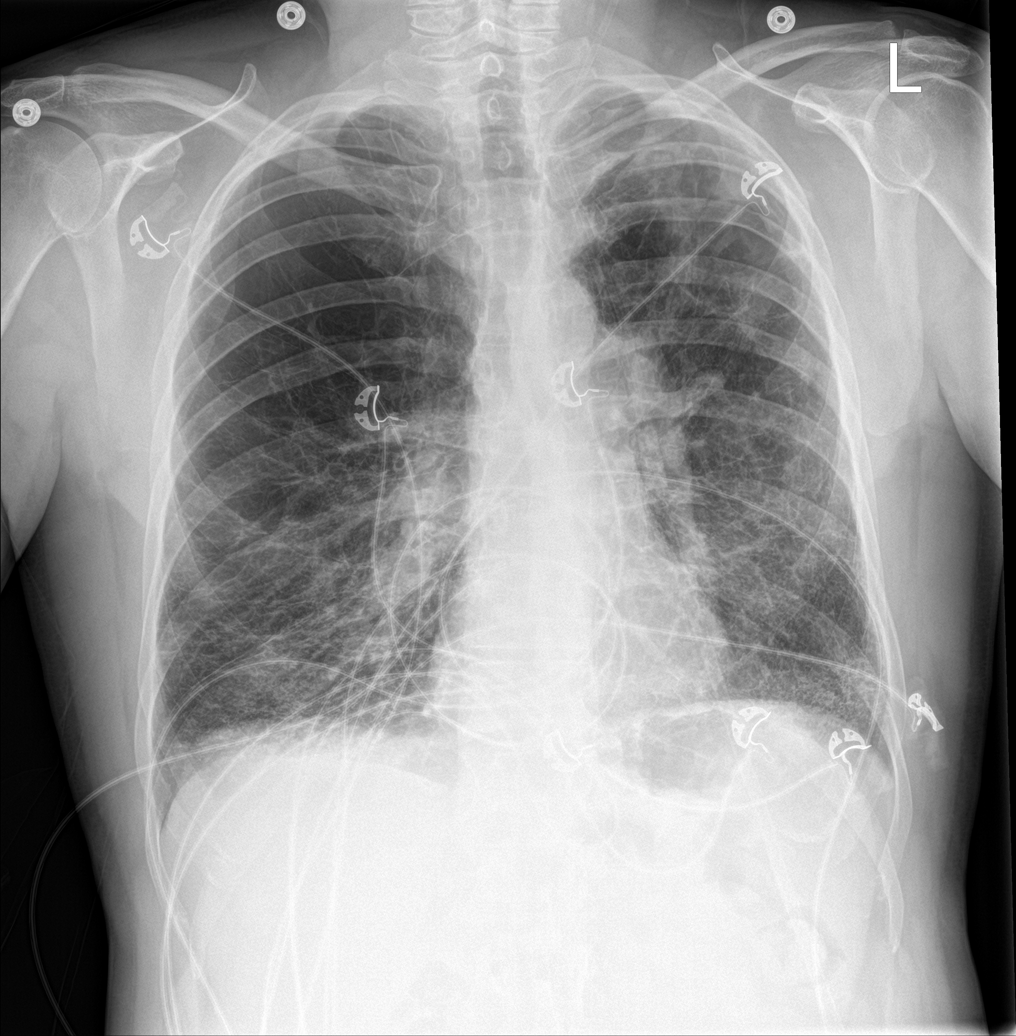

[chest lat (1 of 3)]
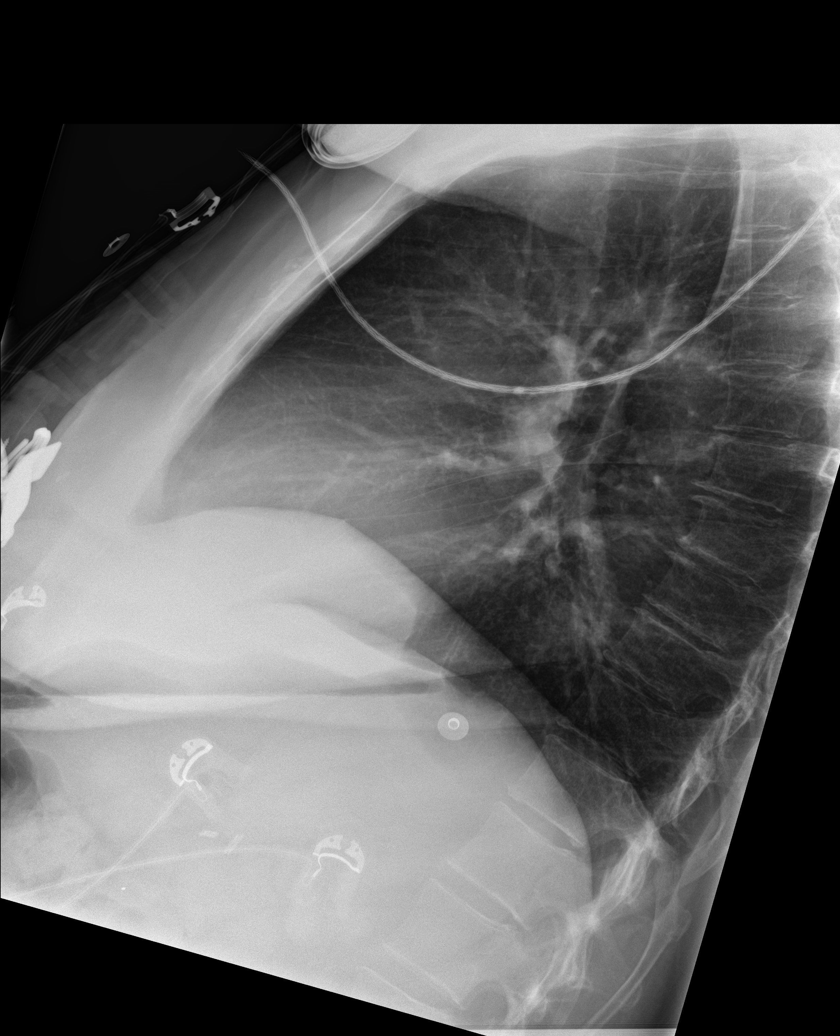

[chest lat (2 of 3)]
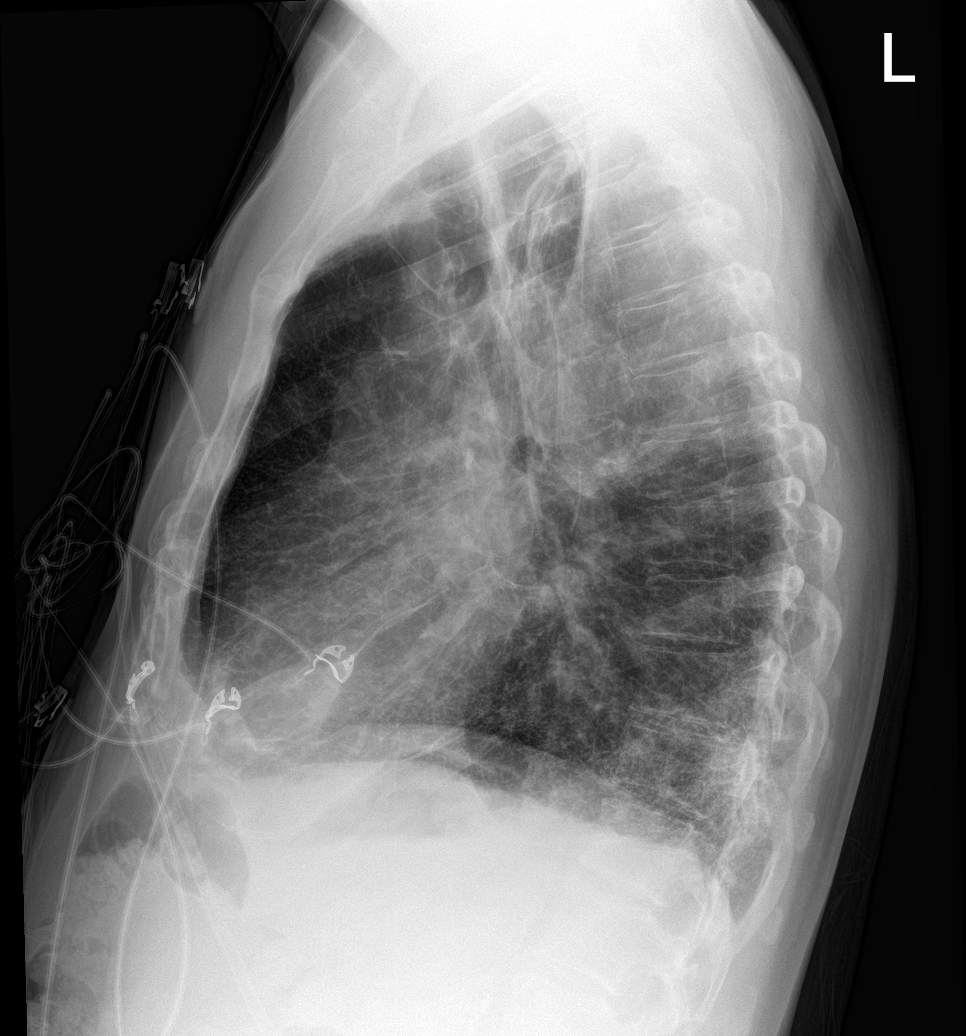

[chest lat (3 of 3)]
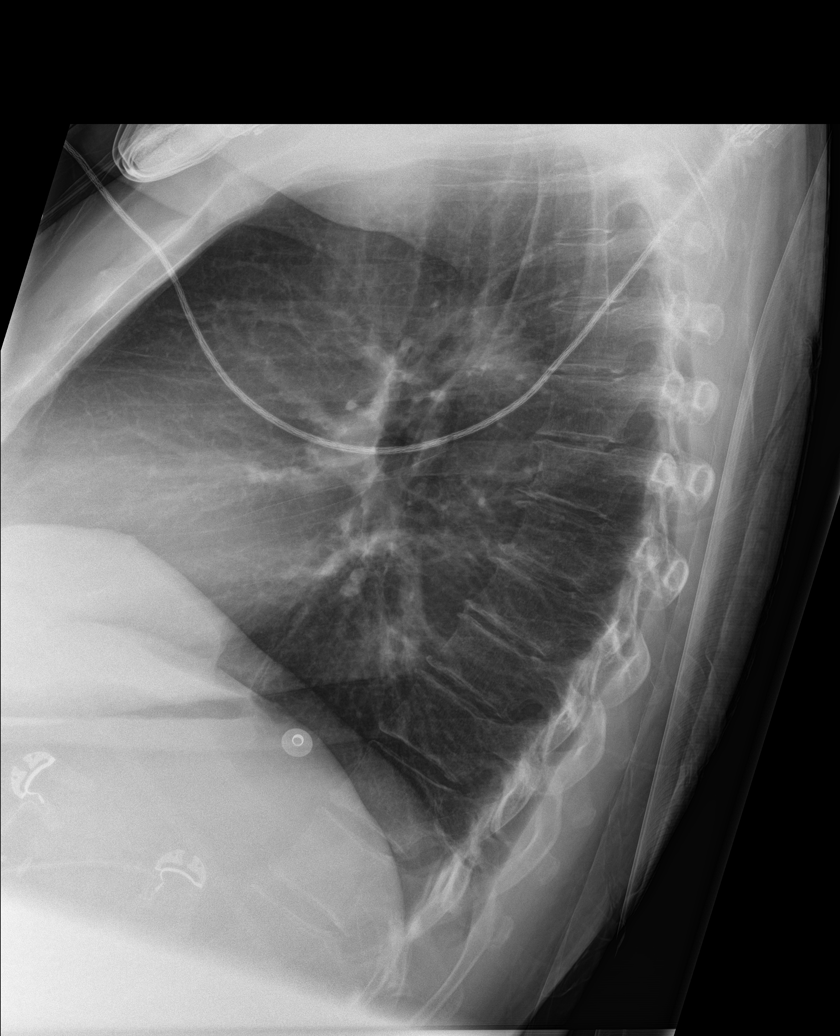

[chest ap]
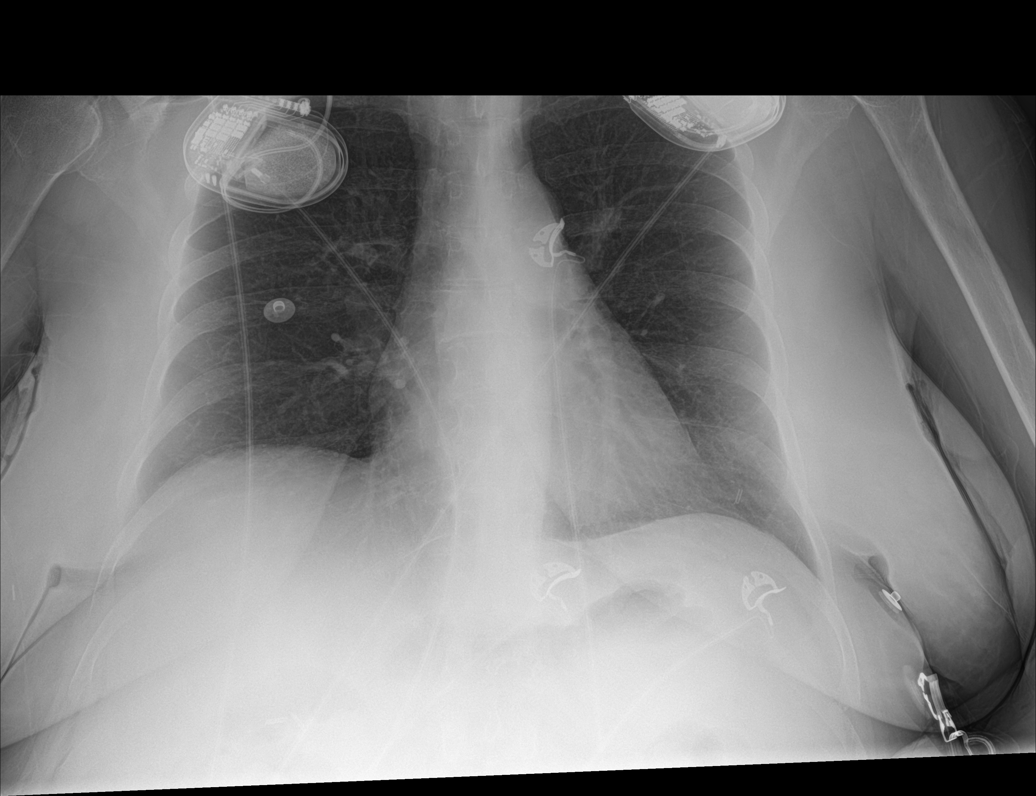

[5 of 5 positions shown; findings below may reference images not displayed]

FINDINGS: Multiple monitoring leads overlie the patient. Stable cardiac and
mediastinal contours. Re- demonstrated coarse bilateral interstitial
pulmonary opacities involving the mid and lower lungs. Apical
emphysematous change. Re- demonstrated peripheral consolidative
opacity within the left upper lung. No pleural effusion
pneumothorax.
IMPRESSION: Findings most compatible with interstitial lung disease within the
bilateral mid and lower lungs.

Re- demonstrated cavitary lesion within the left upper lobe.

Emphysema.

## 2016-10-09 ENCOUNTER — Other Ambulatory Visit: Payer: Self-pay | Admitting: Cardiology
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
## 2016-10-12 IMAGING — CR DG CHEST 1V PORT
1 series · 1 of 1 positions shown · non-contrast
Comparison: 07/25/2015.

CLINICAL DATA: Intubation.

EXAM:
PORTABLE CHEST 1 VIEW

[ap]
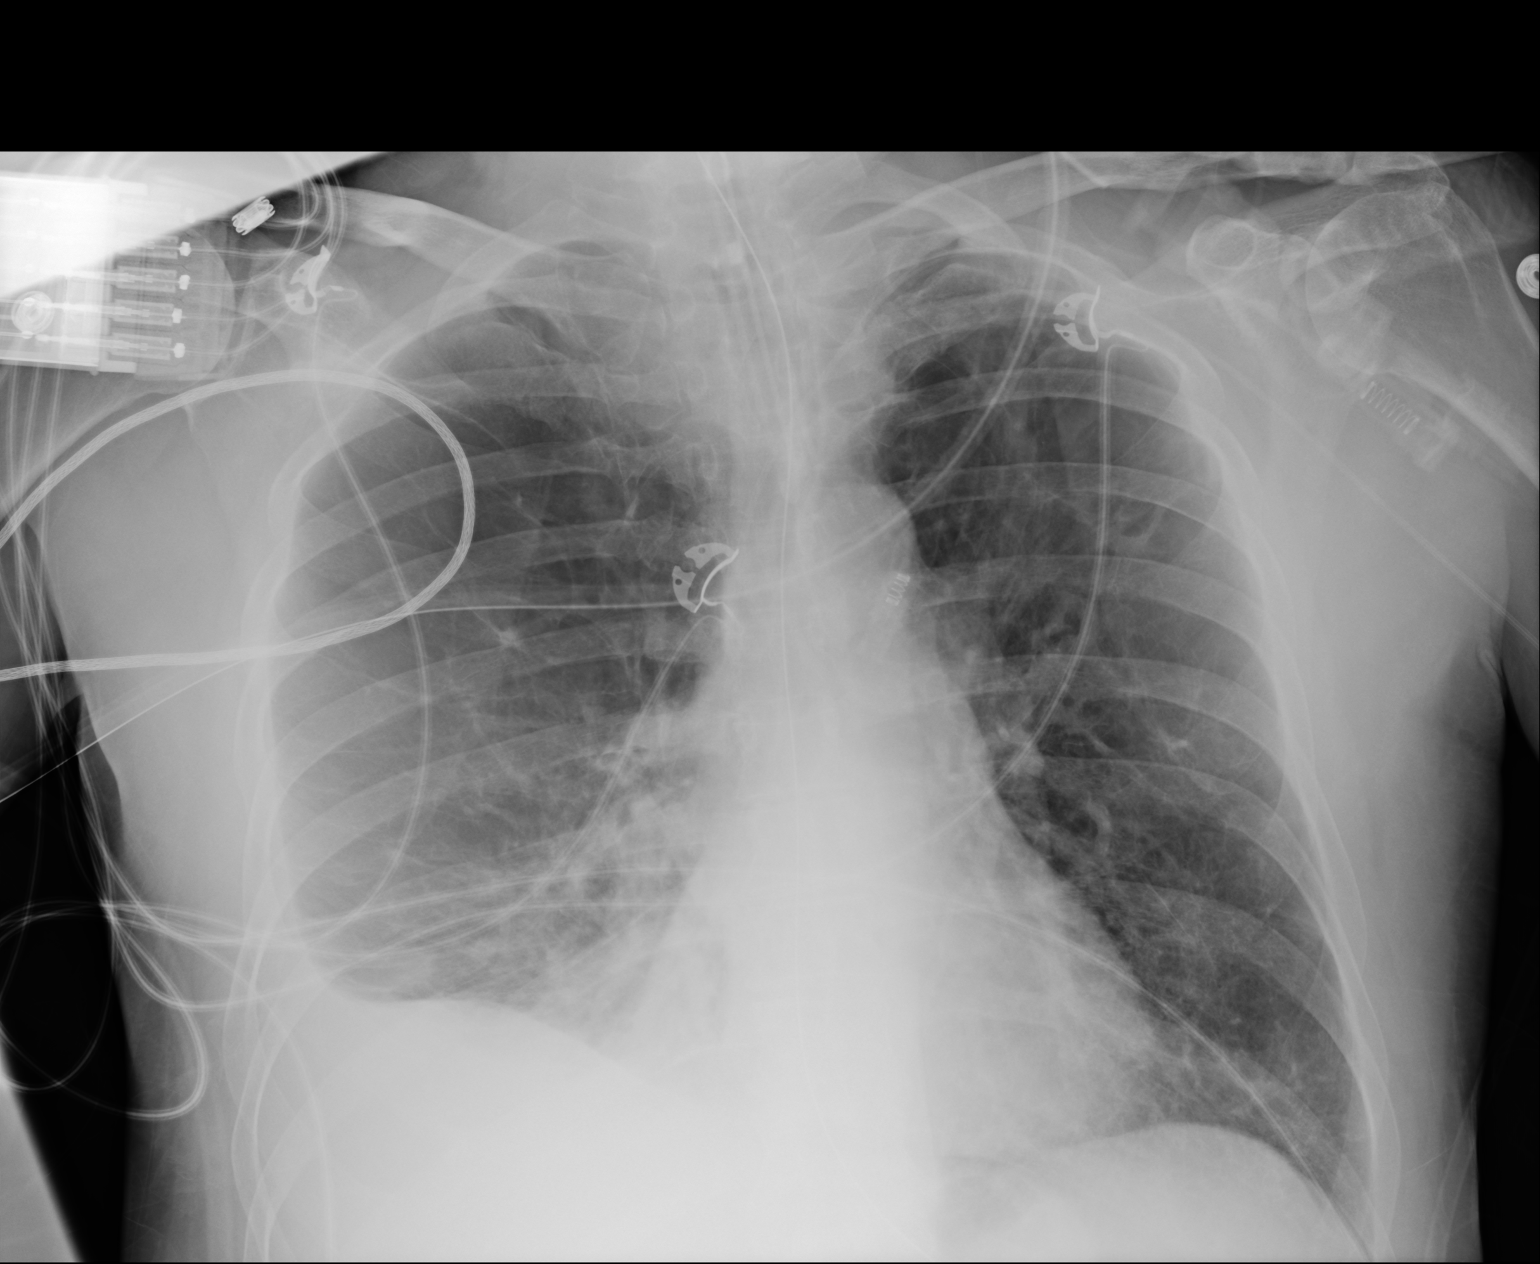

[1 of 1 positions shown; findings below may reference images not displayed]

FINDINGS: Endotracheal tube and NG tube in stable position. Mediastinum and
hilar structures normal. Heart size normal. COPD . Improving
infiltrate right lower lobe. Persistent small pleural effusion. No
pneumothorax.
IMPRESSION: 1. Lines and tubes in stable position .
2. Interim slight improvement of right lower lobe infiltrate.
Persistent small right pleural effusion.

## 2016-10-16 ENCOUNTER — Encounter: Payer: Medicare Other | Attending: Physical Medicine & Rehabilitation | Admitting: Physical Medicine & Rehabilitation

## 2016-10-16 DIAGNOSIS — R569 Unspecified convulsions: Secondary | ICD-10-CM | POA: Insufficient documentation

## 2016-10-16 DIAGNOSIS — M5410 Radiculopathy, site unspecified: Secondary | ICD-10-CM | POA: Insufficient documentation

## 2016-10-16 DIAGNOSIS — M791 Myalgia: Secondary | ICD-10-CM | POA: Insufficient documentation

## 2016-10-16 DIAGNOSIS — J449 Chronic obstructive pulmonary disease, unspecified: Secondary | ICD-10-CM | POA: Insufficient documentation

## 2016-10-16 DIAGNOSIS — M47892 Other spondylosis, cervical region: Secondary | ICD-10-CM | POA: Insufficient documentation

## 2016-10-16 DIAGNOSIS — Z87891 Personal history of nicotine dependence: Secondary | ICD-10-CM | POA: Insufficient documentation

## 2016-10-16 DIAGNOSIS — G8929 Other chronic pain: Secondary | ICD-10-CM | POA: Insufficient documentation

## 2016-10-16 DIAGNOSIS — G479 Sleep disorder, unspecified: Secondary | ICD-10-CM | POA: Insufficient documentation

## 2016-10-16 DIAGNOSIS — E119 Type 2 diabetes mellitus without complications: Secondary | ICD-10-CM | POA: Insufficient documentation

## 2016-10-22 DIAGNOSIS — J9610 Chronic respiratory failure, unspecified whether with hypoxia or hypercapnia: Secondary | ICD-10-CM | POA: Diagnosis not present

## 2016-10-22 DIAGNOSIS — J449 Chronic obstructive pulmonary disease, unspecified: Secondary | ICD-10-CM | POA: Diagnosis not present

## 2016-10-22 DIAGNOSIS — J439 Emphysema, unspecified: Secondary | ICD-10-CM | POA: Diagnosis not present

## 2016-10-22 DIAGNOSIS — R0902 Hypoxemia: Secondary | ICD-10-CM | POA: Diagnosis not present

## 2016-11-02 ENCOUNTER — Other Ambulatory Visit: Payer: Self-pay | Admitting: Physical Medicine & Rehabilitation

## 2016-11-03 IMAGING — CR DG CHEST 1V PORT
1 series · 1 of 1 positions shown · non-contrast
Comparison: Single view of the chest 07/26/2015.

CLINICAL DATA: Shortness of breath. Pneumonia. Subsequent
encounter.

EXAM:
PORTABLE CHEST 1 VIEW

[ap portable]
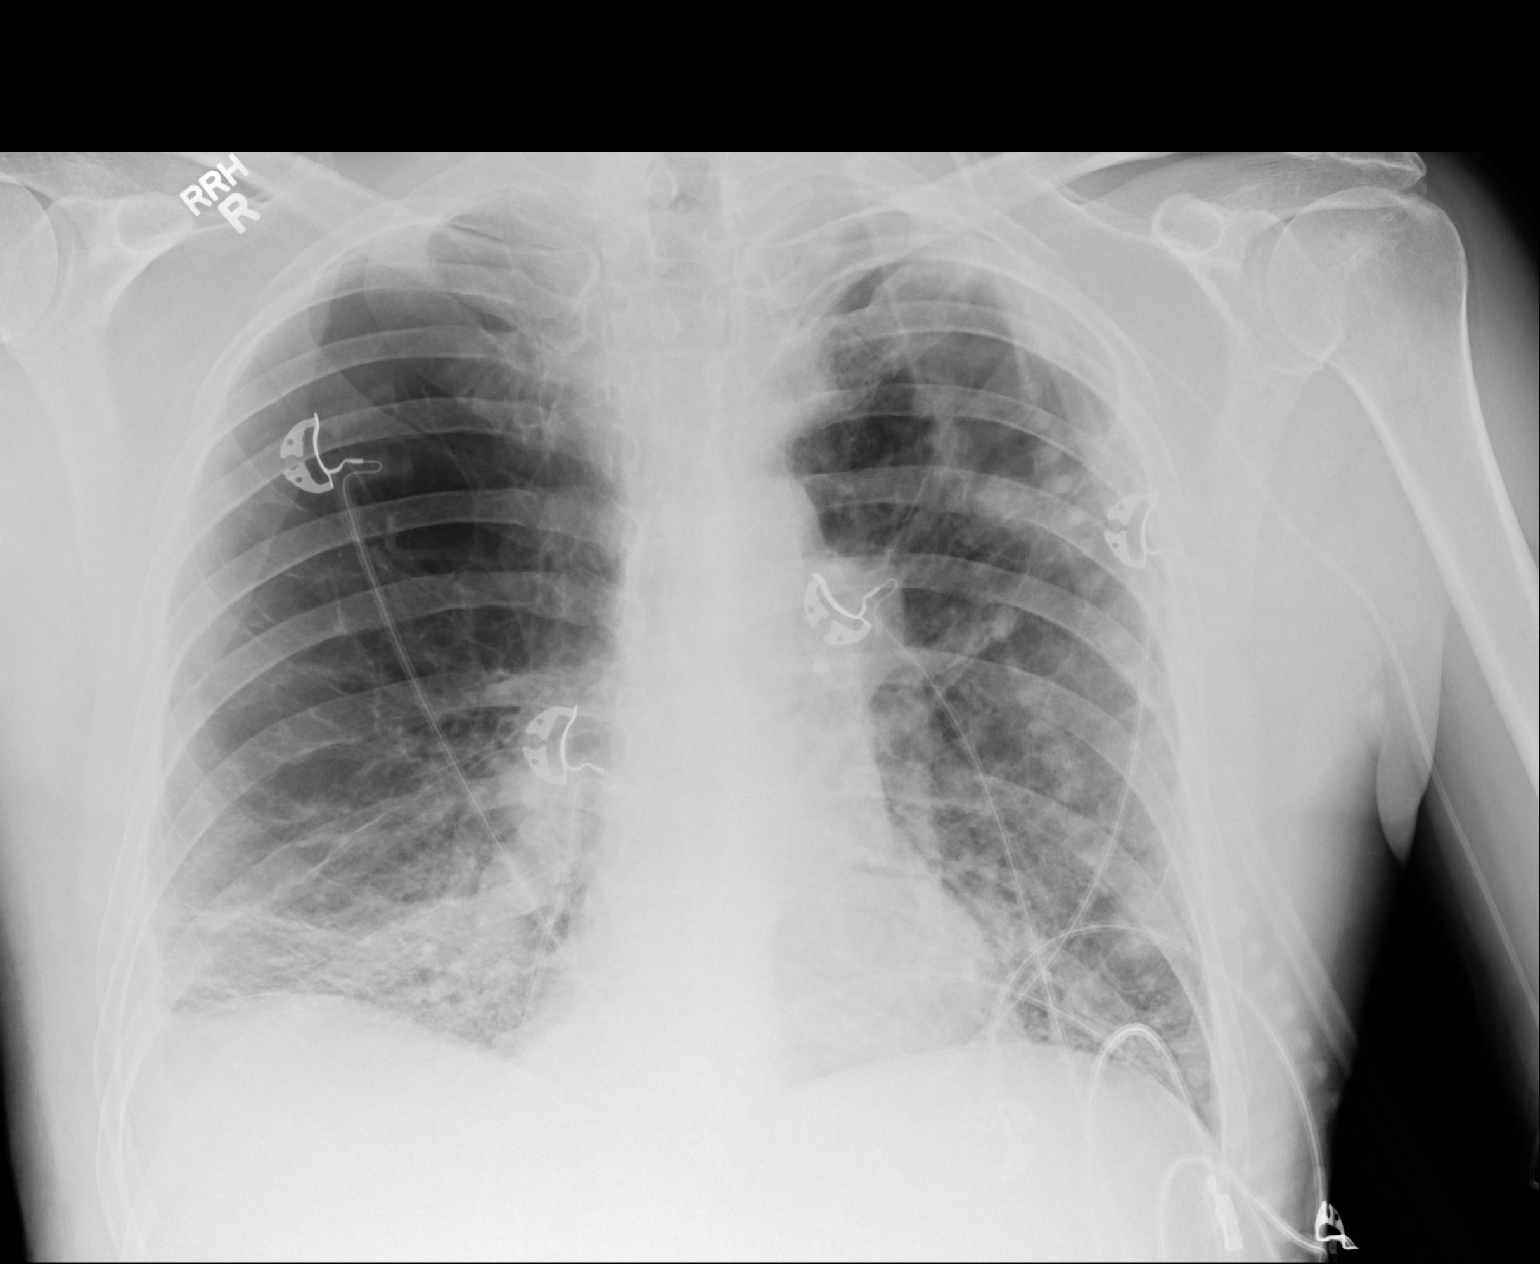

[1 of 1 positions shown; findings below may reference images not displayed]

FINDINGS: Endotracheal tube and NG tube been removed. Small right pleural
effusion is decreased in size since the comparison study. There is
streaky right basilar airspace disease. Mild left basilar
atelectasis is unchanged. The lungs are markedly emphysematous. Scar
in the left upper lobe is not well demonstrated due to overlying
tubes. No pneumothorax. Heart size normal.
IMPRESSION: Decreased right pleural effusion with persistent streaky right
basilar airspace opacity which is worrisome for pneumonia.

Emphysema.

## 2016-11-06 IMAGING — CR DG FOOT 2V*L*
2 series · 2 of 2 positions shown · non-contrast
Comparison: Portable exam 7379 hours compared to 05/01/2015

CLINICAL DATA: LEFT foot pain and swelling

EXAM:
LEFT FOOT - 2 VIEW

[ap]
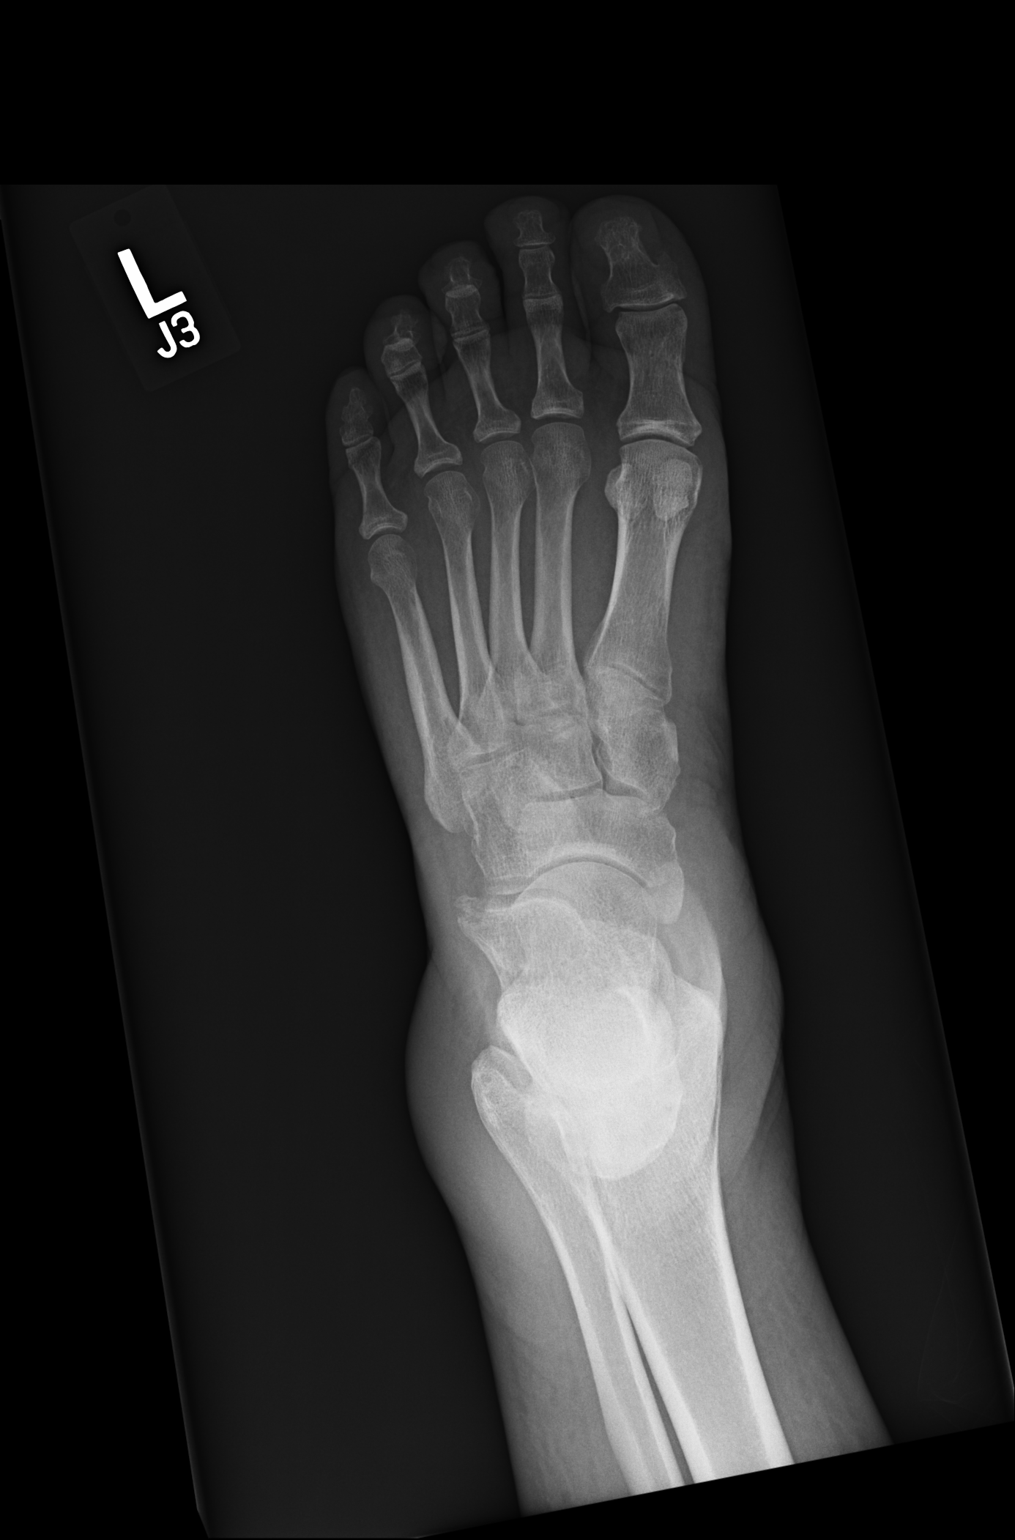

[lat]
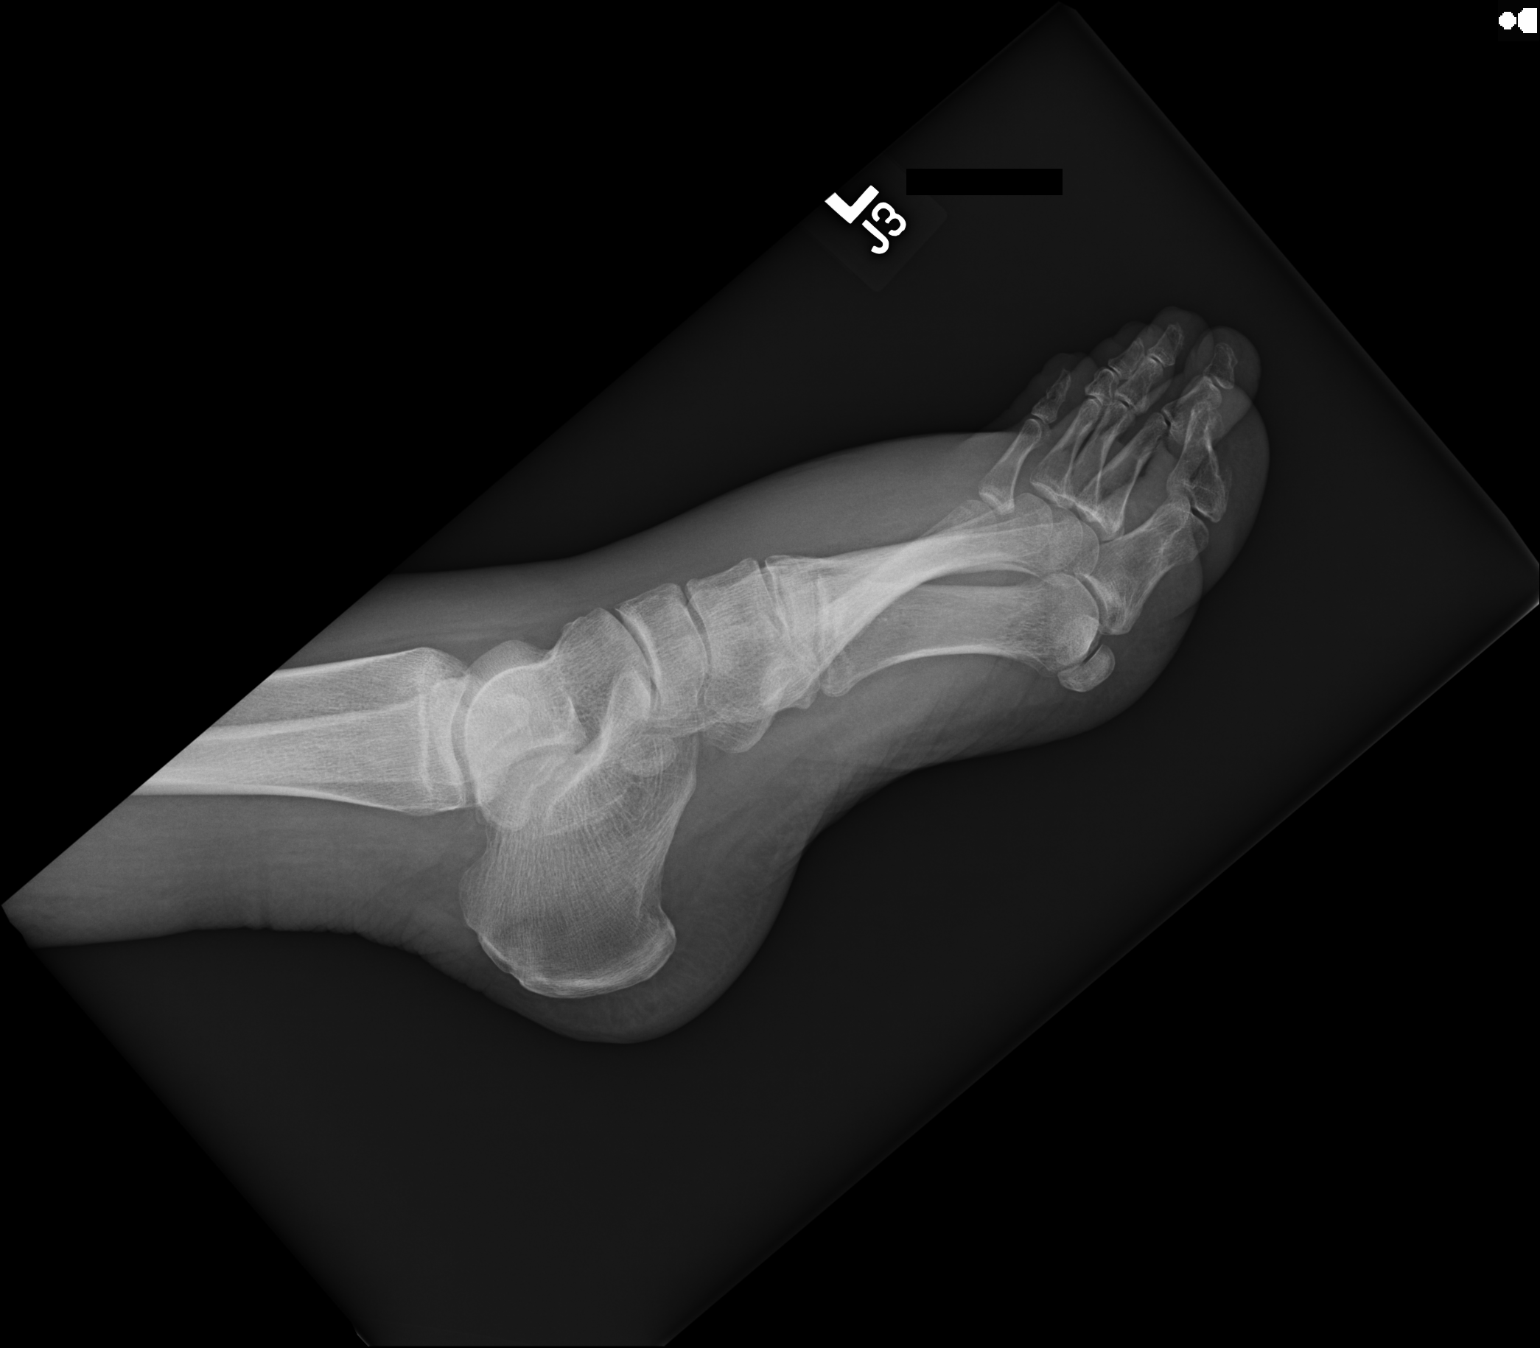

[2 of 2 positions shown; findings below may reference images not displayed]

FINDINGS: Bones appear demineralized.

Joint spaces preserved.

Displaced fracture fragment identified at distal lateral aspect of
the calcaneus at calcaneal cuboid joint.

No additional fracture, dislocation or bone destruction.

Diffuse soft tissue swelling LEFT foot greatest at dorsum.
IMPRESSION: Subacute distal LEFT calcaneal fracture extending intact
calcaneocuboid joint.

Marked soft tissue swelling without additional acute bony
abnormalities.

## 2016-11-15 IMAGING — CT CT CHEST W/O CM
2 of 3 series · 15 of 36 positions shown, 18 images · non-contrast
Comparison: Chest radiograph on 08/29/2015 and CT on 05/01/2015

CLINICAL DATA: Hypoxia and fever. Cough and shortness of breath.
COPD. Enlarging left upper lobe cavitary lesions seen on recent
chest radiograph.

EXAM:
CT CHEST WITHOUT CONTRAST
TECHNIQUE: Multidetector CT imaging of the chest was performed following the
standard protocol without IV contrast.

[Series 2: chestroutine 5.0 b40f · axial · 0.66mm/px · z∈[-290,-24]mm · 12 of 63 slices shown, 15 images]
[im 5/63  mediastinal]
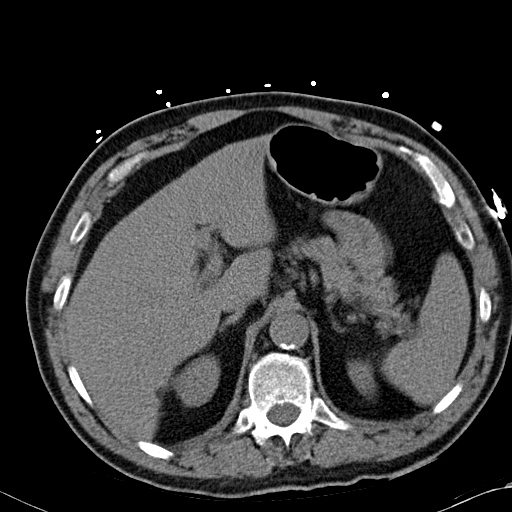
[im 5/63  lung]
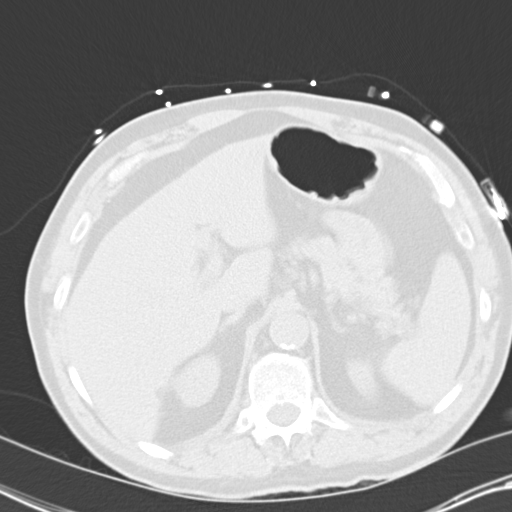
[im 10/63  lung]
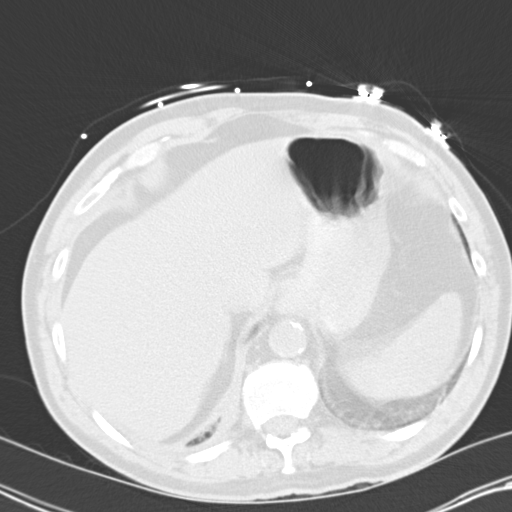
[im 14/63  lung]
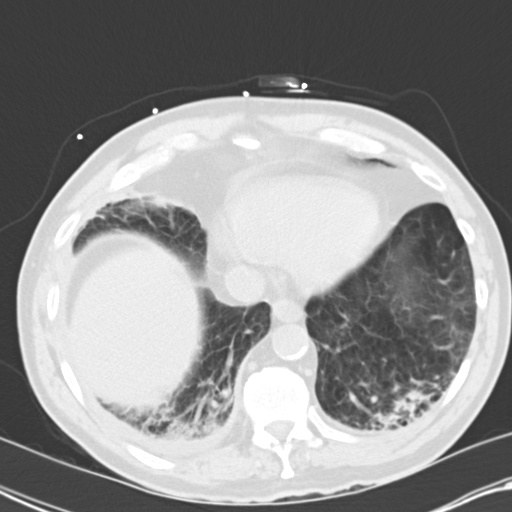
[im 19/63  lung]
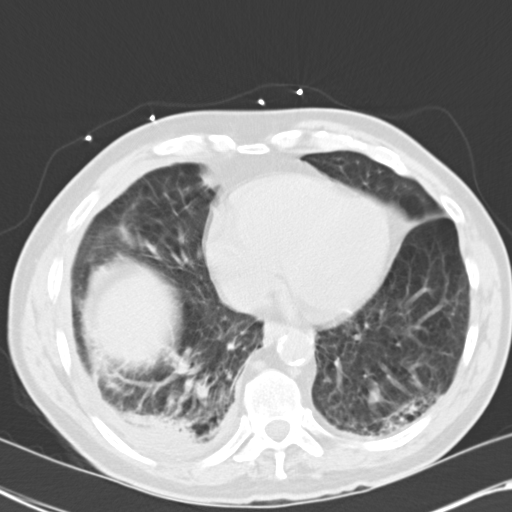
[im 23/63  mediastinal]
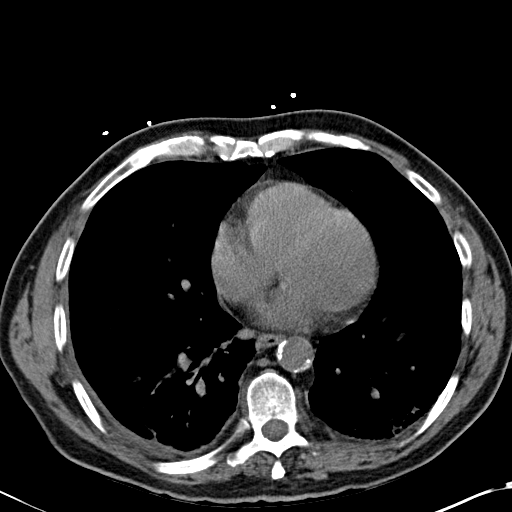
[im 23/63  lung]
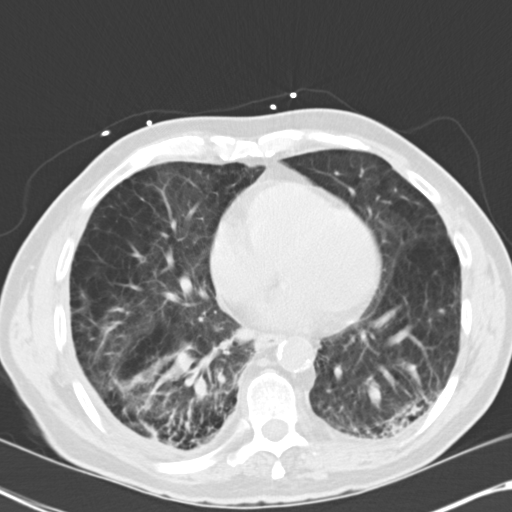
[im 28/63  lung]
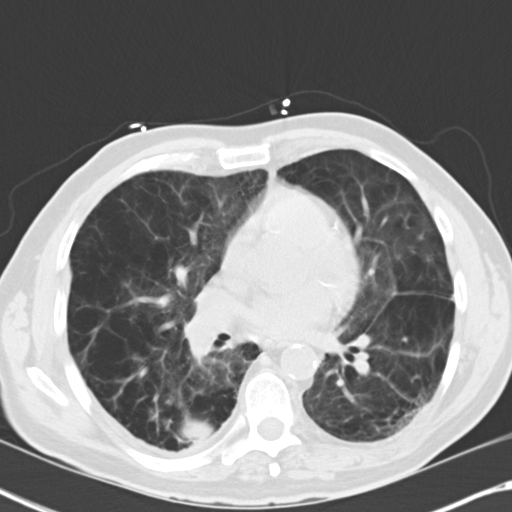
[im 35/63  lung]
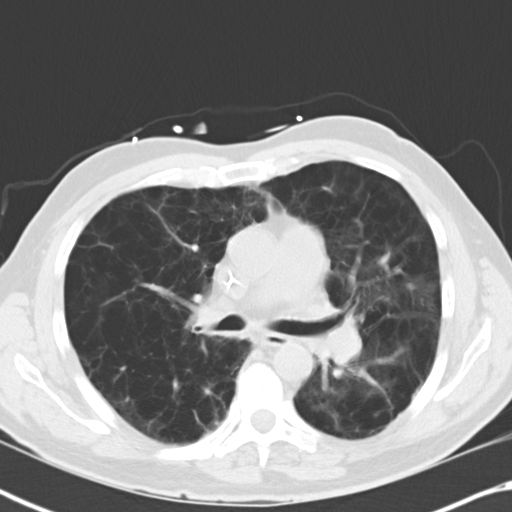
[im 40/63  lung]
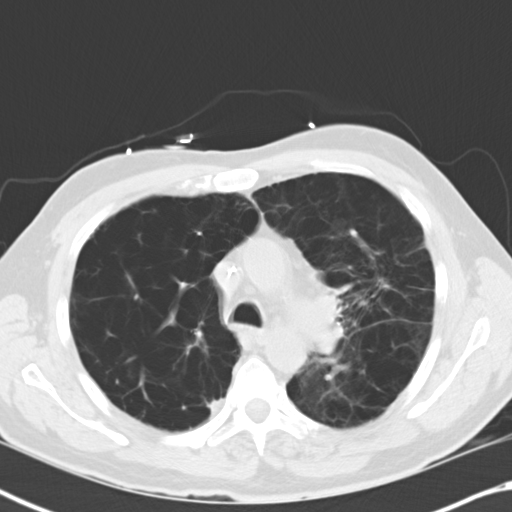
[im 44/63  mediastinal]
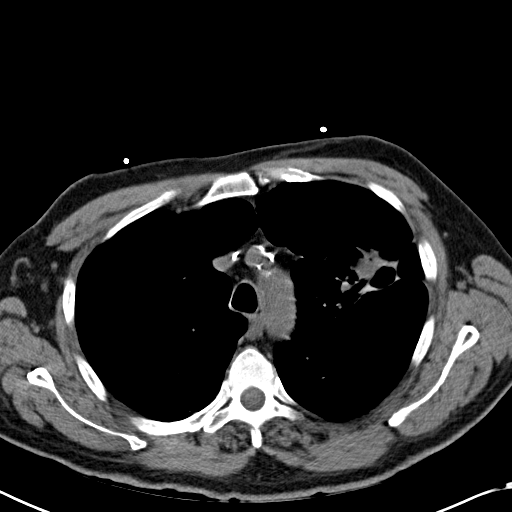
[im 44/63  lung]
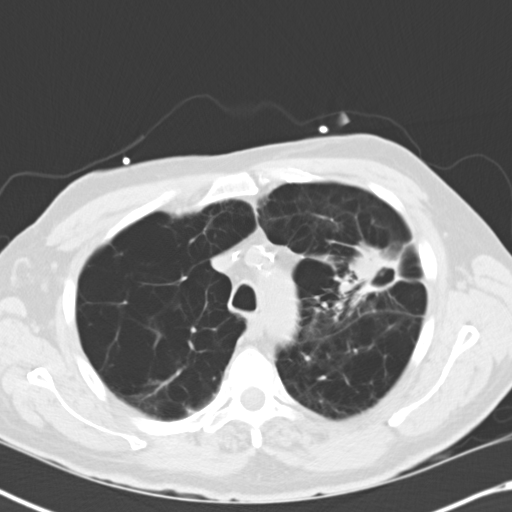
[im 49/63  lung]
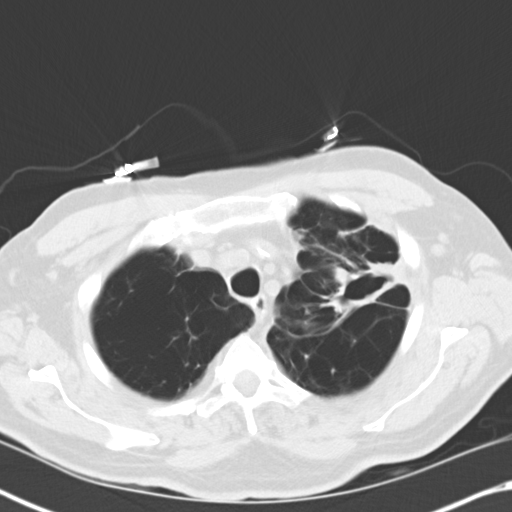
[im 53/63  lung]
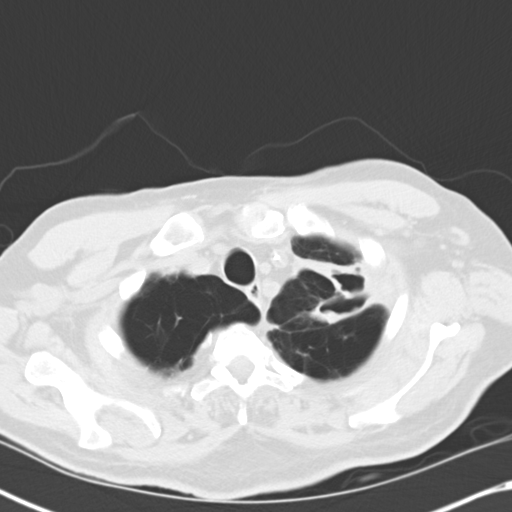
[im 58/63  lung]
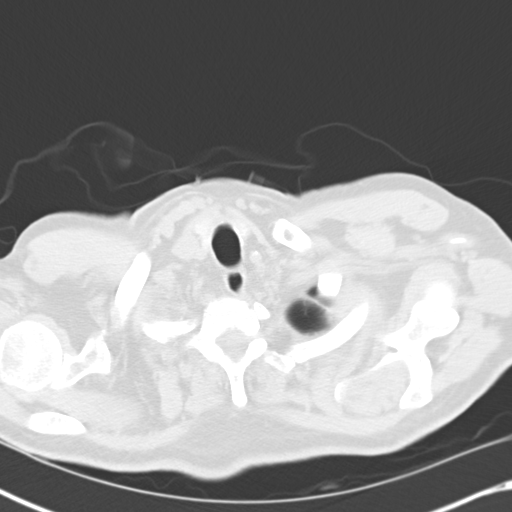

[Series 4: mpr coro 3mm · coronal · 0.62mm/px · 3 of 87 slices shown]
[im 18/87  lung]
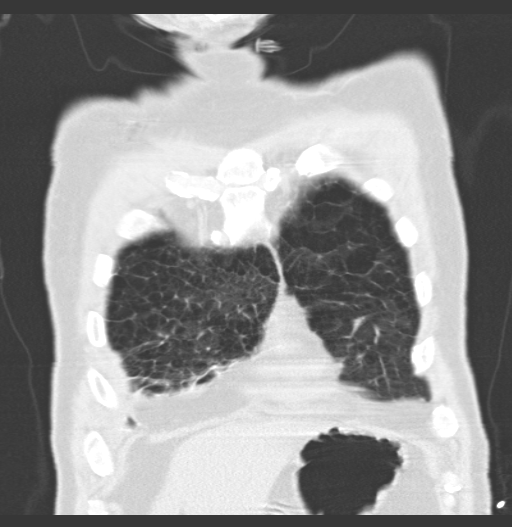
[im 35/87  lung]
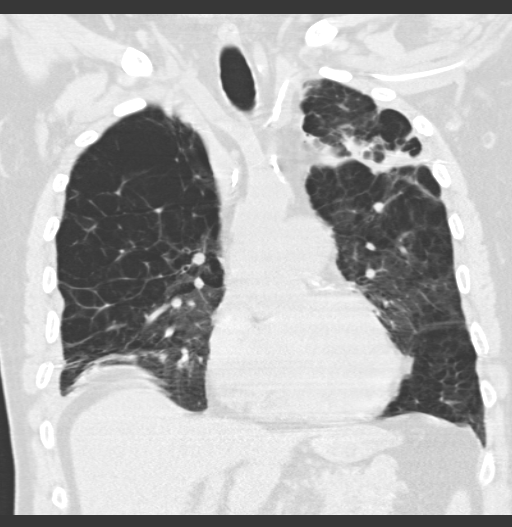
[im 52/87  lung]
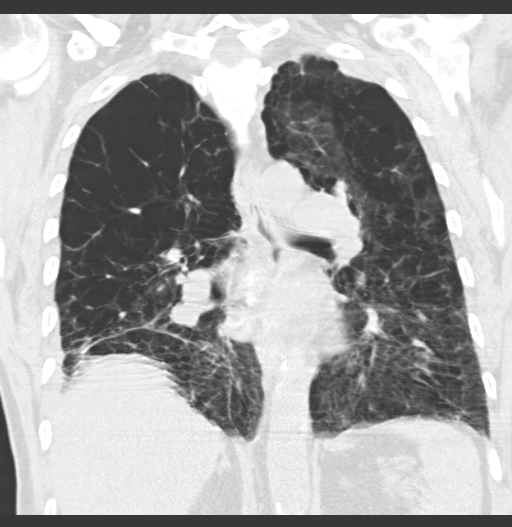

[15 of 36 positions shown; findings below may reference images not displayed]

FINDINGS: Mediastinum/Lymph Nodes: No masses or pathologically enlarged lymph
nodes identified on this un-enhanced exam. Tiny hiatal hernia again
noted. Three-vessel coronary artery calcification again
demonstrated.

Lungs/Pleura: Severe emphysema again demonstrated with upper lobe
predominance. Peripheral honeycombing is seen in the posterior lower
lobes bilaterally which is stable and consistent with chronic
interstitial fibrosis. A new tiny right pleural effusion is seen. No
evidence of left pleural effusion.

A partially solid and partially cavitary lesion is seen in the left
upper lobe which has increased in size since previous study. This
currently measures 3.7 x 5.4 cm on image 19/series 3 compared to
x 3.2 cm previously. This is suspicious for enlarging cavitary
bronchogenic carcinoma, although differential diagnosis still
includes a cavitary infectious or inflammatory process.

Upper abdomen: No acute findings.

Musculoskeletal: No chest wall mass or suspicious bone lesions
identified.
IMPRESSION: Enlarging mixed solid and cavitary lesion in the left upper lobe.
This is suspicious for an enlarging cavitary bronchogenic carcinoma,
although cavitary infectious or inflammatory process are also in the
differential diagnosis. Consider bronchoscopy or percutaneous needle
biopsy.

Severe bullous emphysema and chronic bibasilar predominant
interstitial fibrosis.

Increased tiny right pleural effusion.

No definite evidence of thoracic lymphadenopathy.

## 2016-11-21 DIAGNOSIS — R0902 Hypoxemia: Secondary | ICD-10-CM | POA: Diagnosis not present

## 2016-11-21 DIAGNOSIS — J449 Chronic obstructive pulmonary disease, unspecified: Secondary | ICD-10-CM | POA: Diagnosis not present

## 2016-11-21 DIAGNOSIS — J439 Emphysema, unspecified: Secondary | ICD-10-CM | POA: Diagnosis not present

## 2016-11-21 DIAGNOSIS — J9610 Chronic respiratory failure, unspecified whether with hypoxia or hypercapnia: Secondary | ICD-10-CM | POA: Diagnosis not present

## 2016-12-02 IMAGING — US US EXTREM LOW VENOUS BILAT
1 series · 13 of 24 positions shown · non-contrast
Comparison: None.

CLINICAL DATA: Lower extremity swelling for 1 month



[Series 1: us extrem low venous bilat · 0.08mm/px · 13 of 63 slices shown]
[im 1/63]
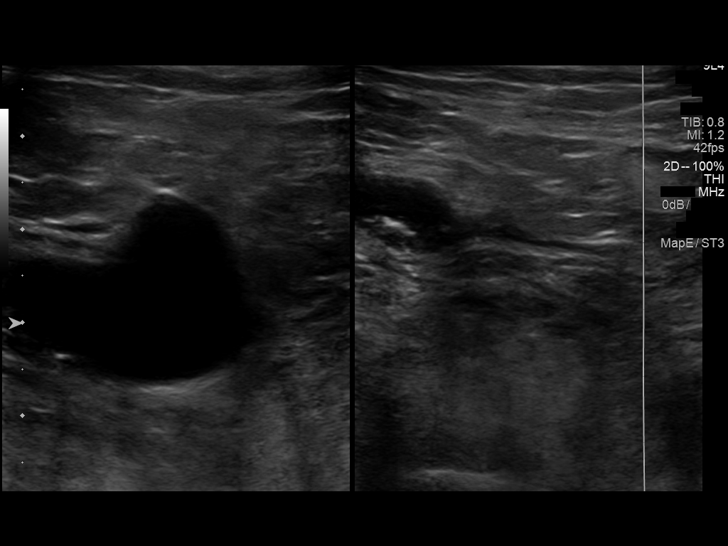
[im 6/63]
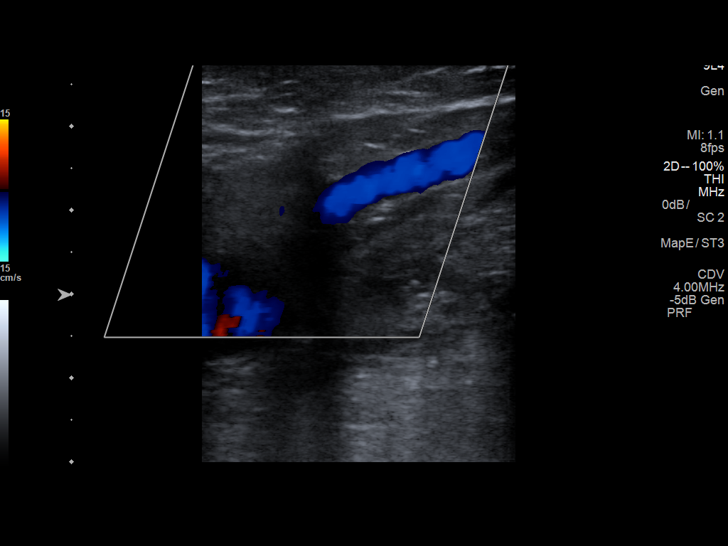
[im 11/63]
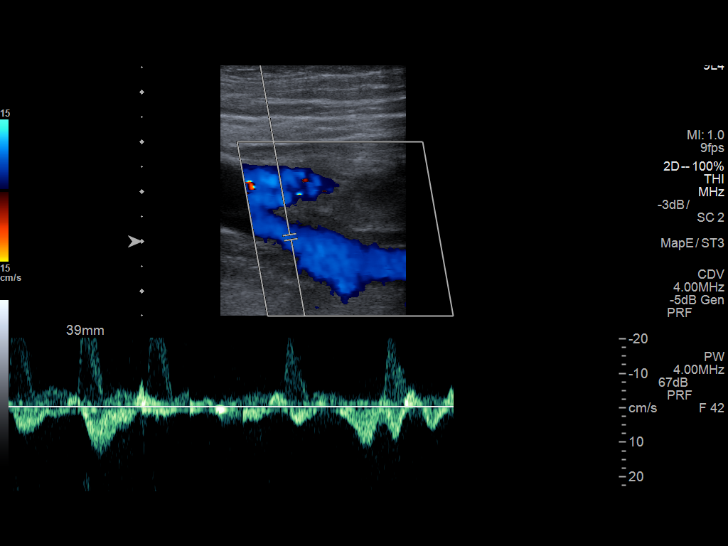
[im 17/63]
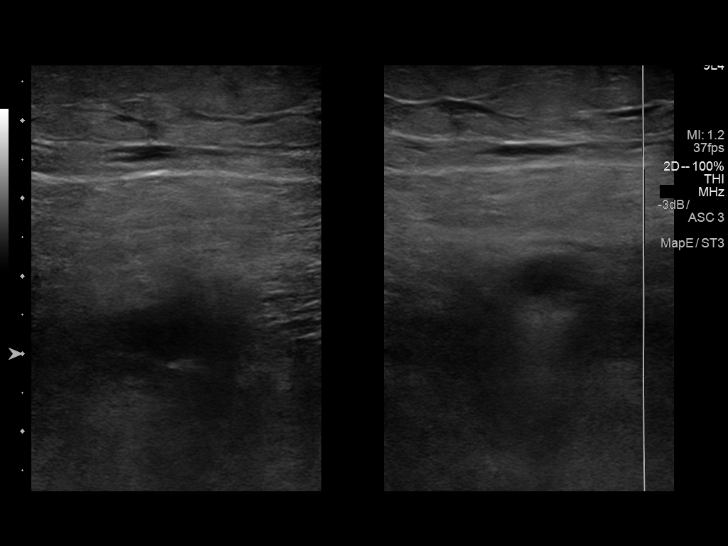
[im 22/63]
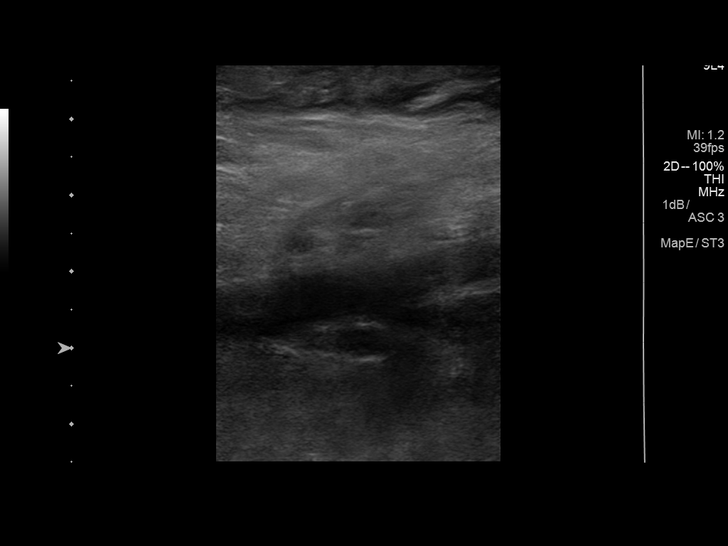
[im 27/63]
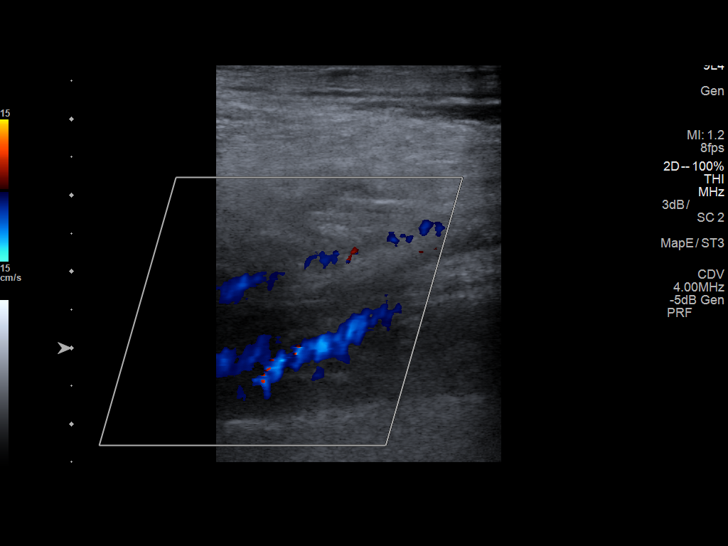
[im 33/63]
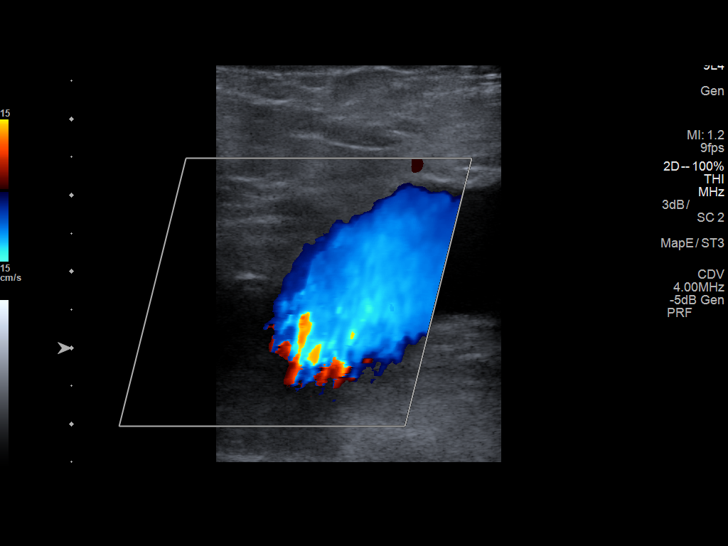
[im 36/63]
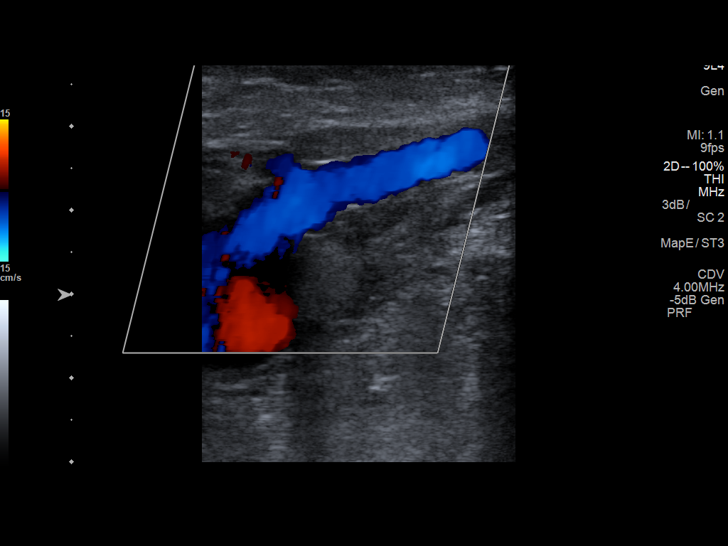
[im 41/63]
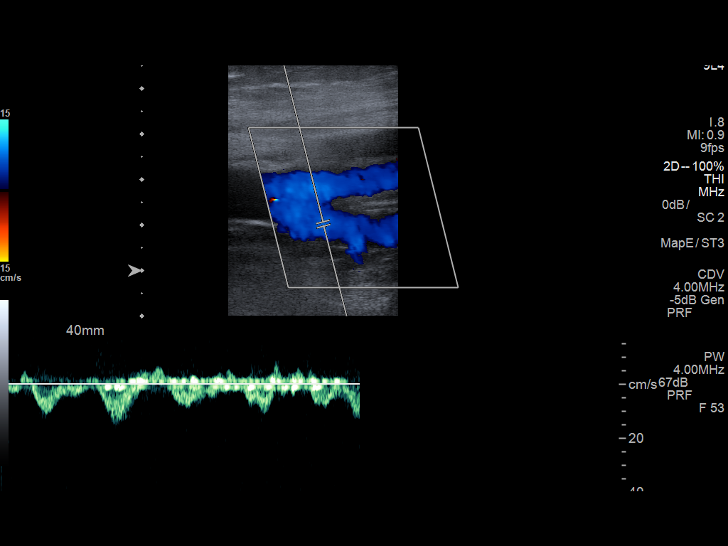
[im 46/63]
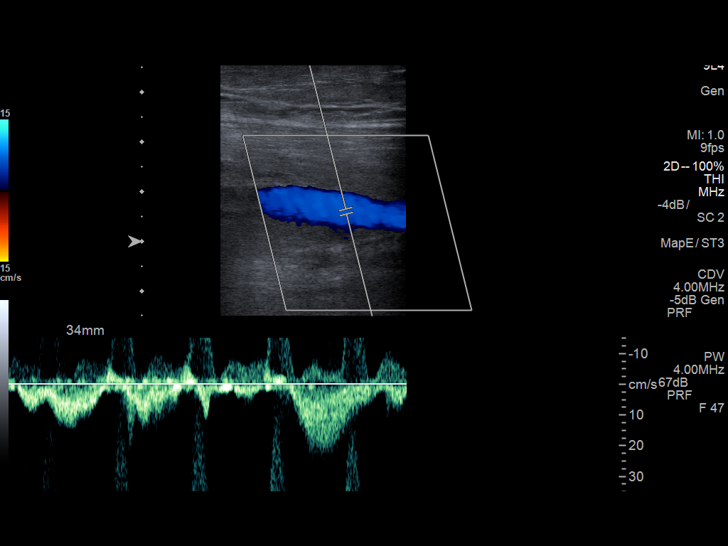
[im 52/63]
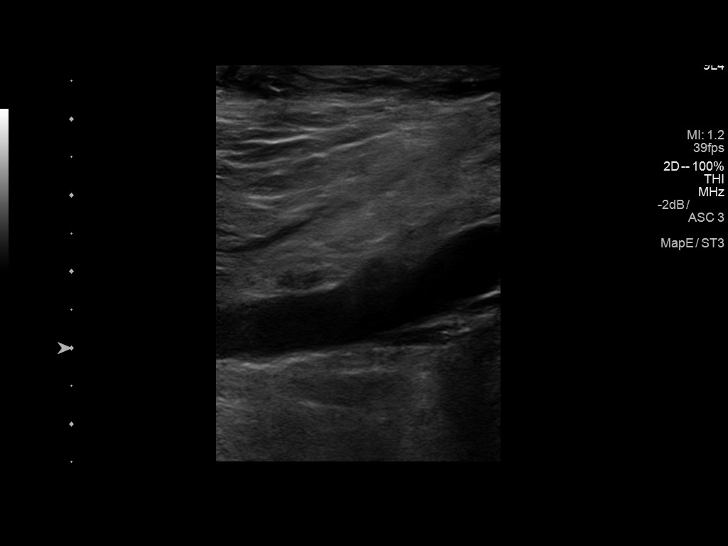
[im 57/63]
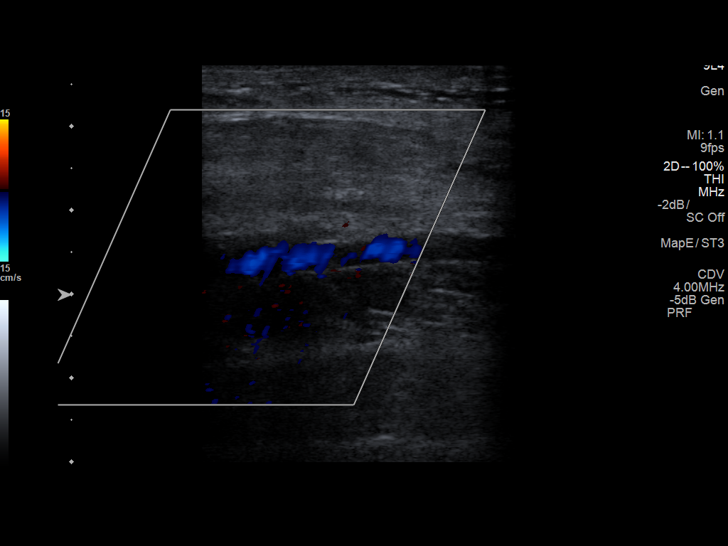
[im 63/63]
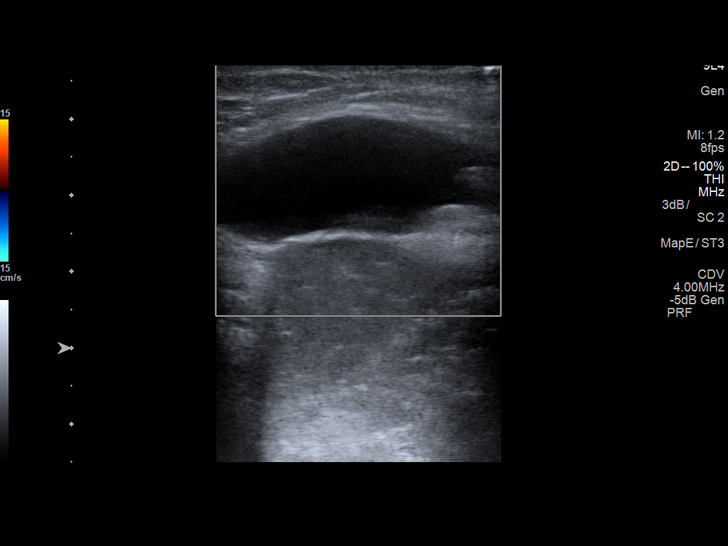

[13 of 24 positions shown; findings below may reference images not displayed]

FINDINGS: RIGHT LOWER EXTREMITY

Common Femoral Vein: No evidence of thrombus. Normal
compressibility, respiratory phasicity and response to augmentation.

Saphenofemoral Junction: No evidence of thrombus. Normal
compressibility and flow on color Doppler imaging.

Profunda Femoral Vein: No evidence of thrombus. Normal
compressibility and flow on color Doppler imaging.

Femoral Vein: No evidence of thrombus. Normal compressibility,
respiratory phasicity and response to augmentation.

Popliteal Vein: No evidence of thrombus. Normal compressibility,
respiratory phasicity and response to augmentation.

Calf Veins: No evidence of thrombus. Normal compressibility and flow
on color Doppler imaging.

Superficial Great Saphenous Vein: No evidence of thrombus. Normal
compressibility and flow on color Doppler imaging.

Venous Reflux:  None.

Other Findings:  Mild subcutaneous edema is noted.

LEFT LOWER EXTREMITY

Common Femoral Vein: No evidence of thrombus. Normal
compressibility, respiratory phasicity and response to augmentation.

Saphenofemoral Junction: No evidence of thrombus. Normal
compressibility and flow on color Doppler imaging.

Profunda Femoral Vein: No evidence of thrombus. Normal
compressibility and flow on color Doppler imaging.

Femoral Vein: No evidence of thrombus. Normal compressibility,
respiratory phasicity and response to augmentation.

Popliteal Vein: No evidence of thrombus. Normal compressibility,
respiratory phasicity and response to augmentation.

Calf Veins: No evidence of thrombus. Normal compressibility and flow
on color Doppler imaging.

Superficial Great Saphenous Vein: No evidence of thrombus. Normal
compressibility and flow on color Doppler imaging.

Venous Reflux:  None.

Other Findings: Mild subcutaneous edema is noted. 3.9 cm lymph node
is noted in the left groin. This is new from the prior exam of
April 2015.
IMPRESSION: No evidence of deep venous thrombosis.

Bilateral lower extremity subcutaneous edema.

Prominent lymph node in the left inguinal region likely reactive in
nature. Clinical correlation is recommended.

## 2016-12-02 IMAGING — US US ABDOMEN COMPLETE
1 series · 14 of 25 positions shown · non-contrast
Comparison: CT abdomen pelvis dated 05/01/2015

CLINICAL DATA: Elevated LFTs, upper abdominal pain

EXAM:
ABDOMEN ULTRASOUND COMPLETE

[Series 1: us abdomen complete · 0.24mm/px · 14 of 85 slices shown]
[im 1/85]
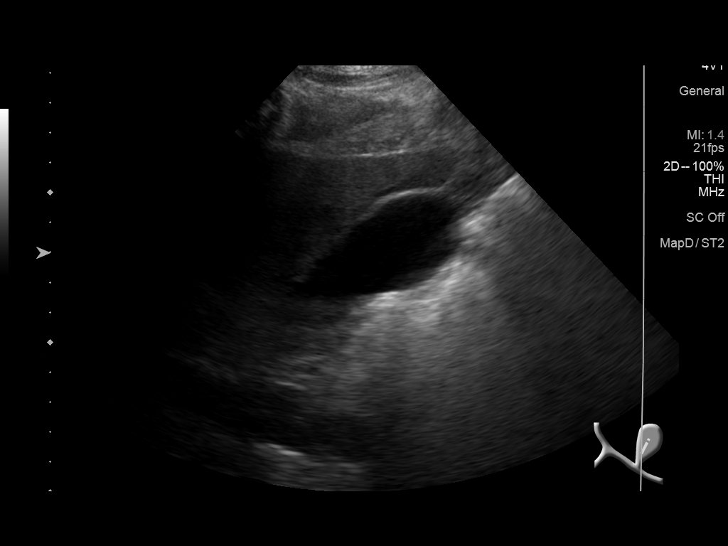
[im 8/85]
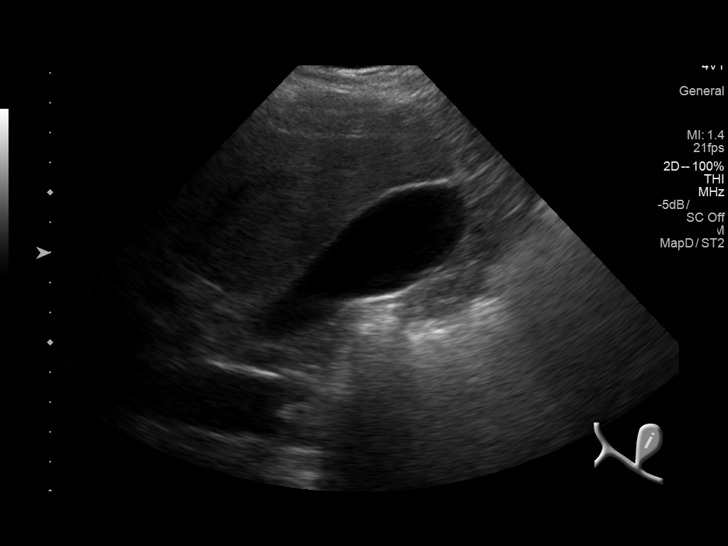
[im 15/85]
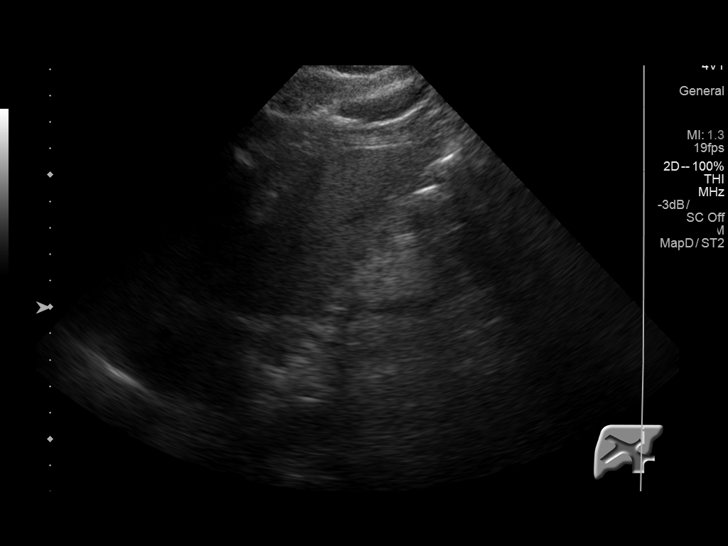
[im 22/85]
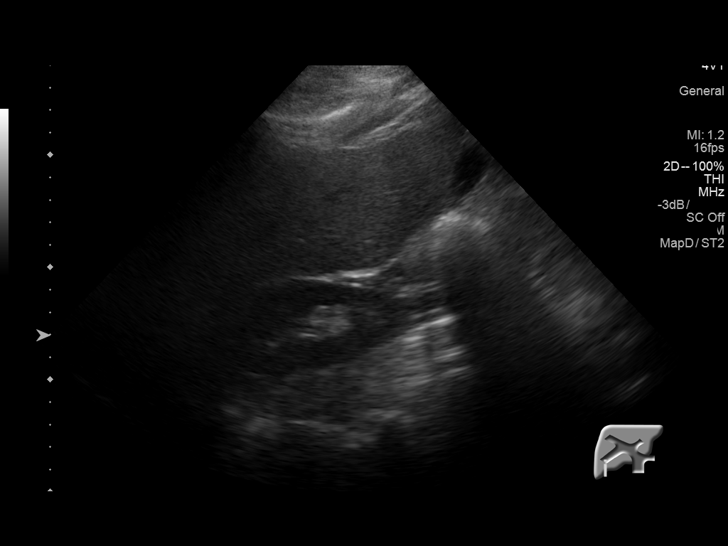
[im 29/85]
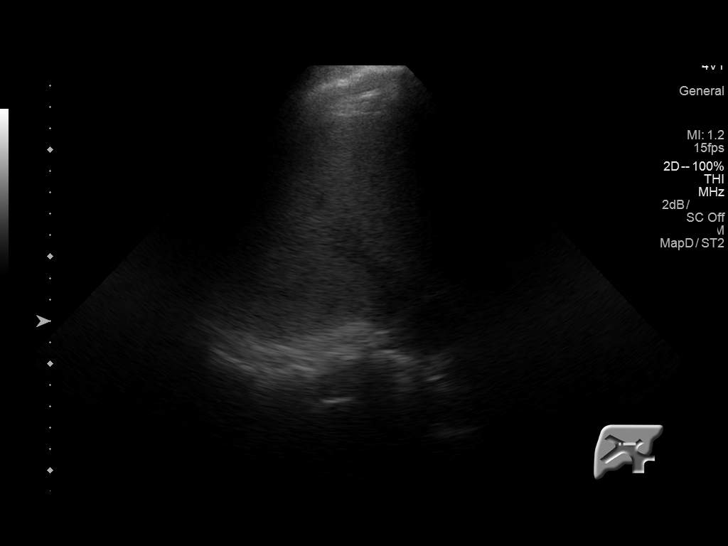
[im 32/85]
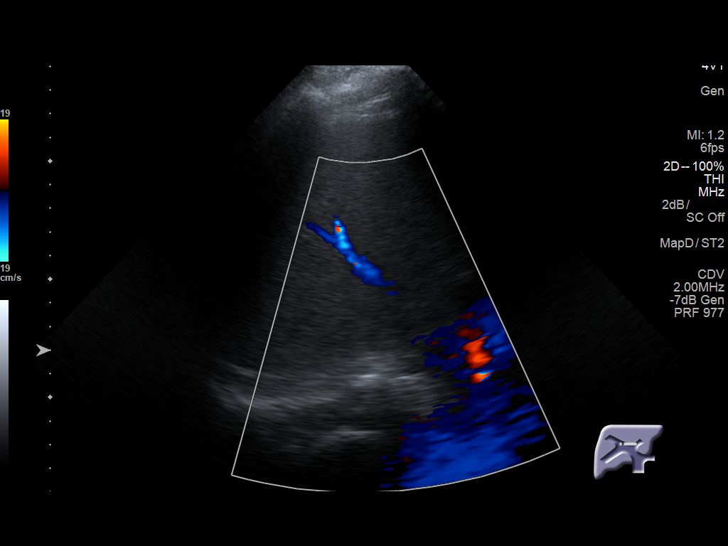
[im 39/85]
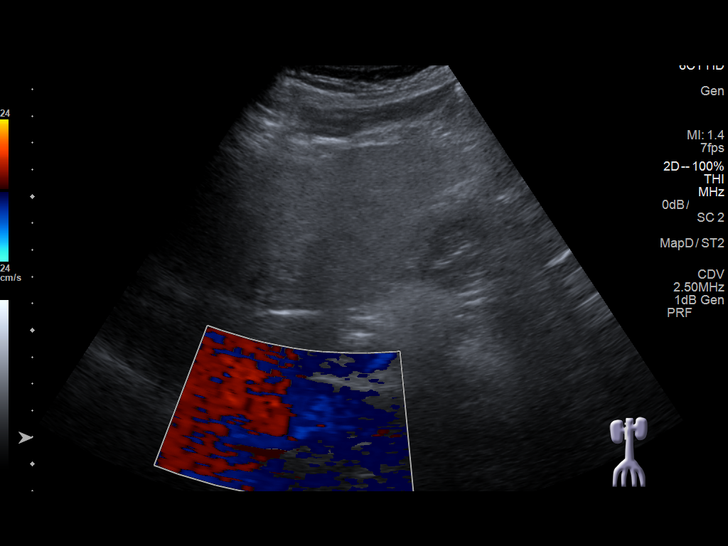
[im 46/85]
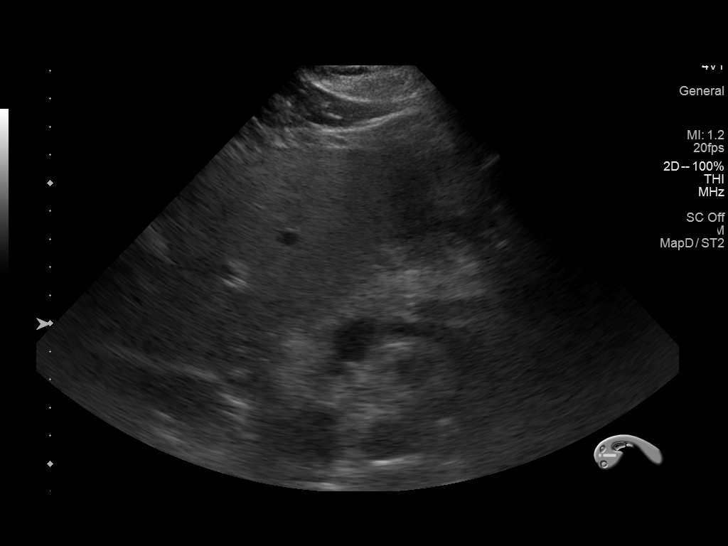
[im 53/85]
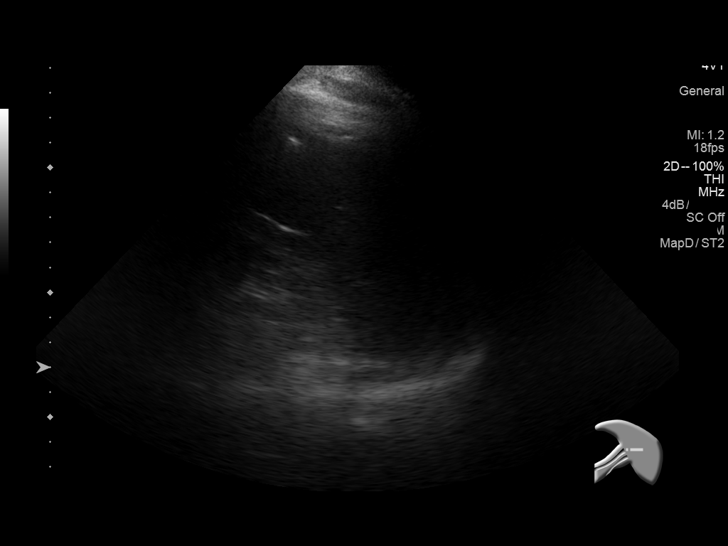
[im 57/85]
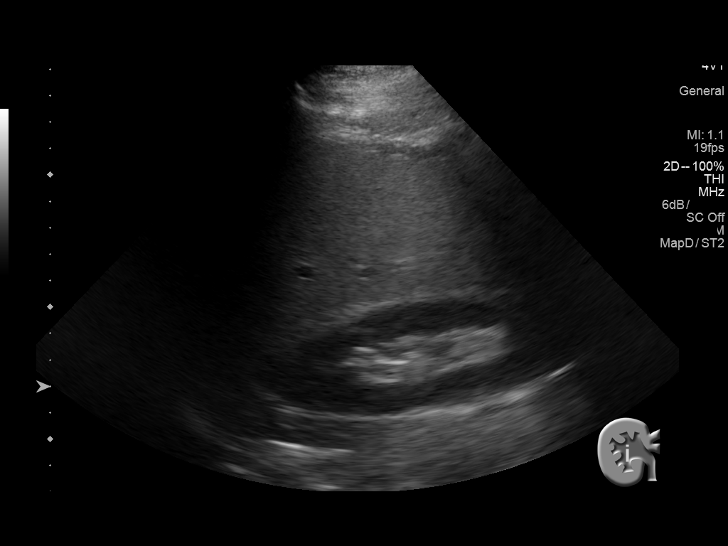
[im 64/85]
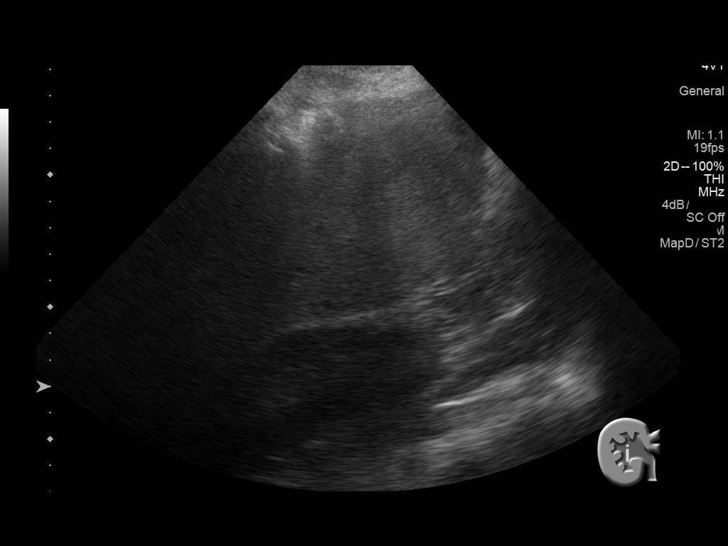
[im 71/85]
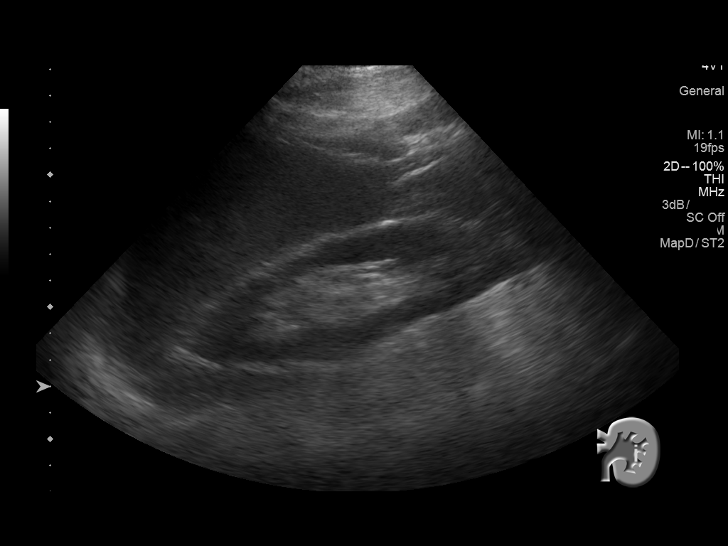
[im 78/85]
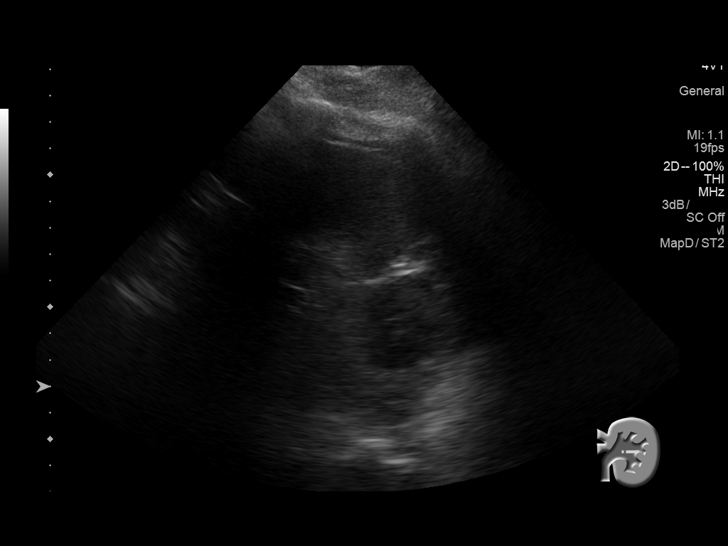
[im 85/85]
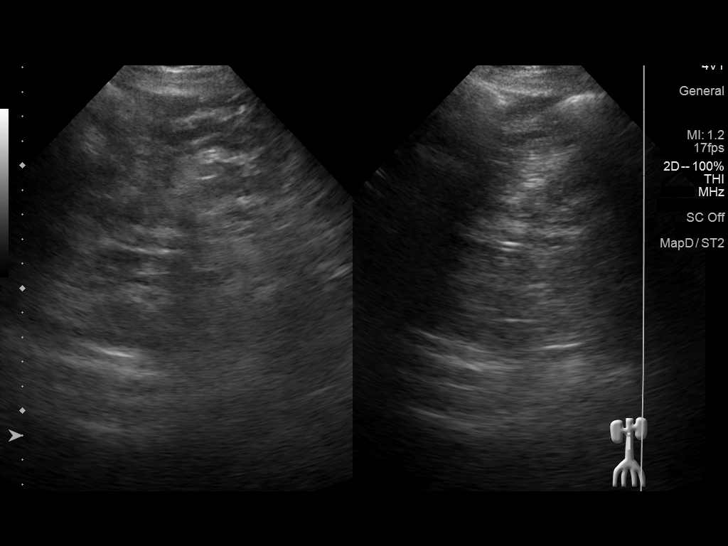

[14 of 25 positions shown; findings below may reference images not displayed]

FINDINGS: Gallbladder: No gallstones, gallbladder wall thickening, or
pericholecystic fluid. Negative sonographic Murphy's sign.

Common bile duct: Diameter: 4 mm

Liver: Coarse hepatic echotexture with hyperechoic hepatic
parenchyma, nonspecific but likely reflecting hepatic steatosis. No
focal hepatic lesion is seen.

IVC: No abnormality visualized.

Pancreas: Visualized portion unremarkable.

Spleen: Size and appearance within normal limits.

Right Kidney: Length: 12.3 cm.  No mass or hydronephrosis.

Left Kidney: Length: 12.7 cm.  No mass or hydronephrosis.

Abdominal aorta: No aneurysm visualized.

Other findings: None.
IMPRESSION: Suspected hepatic steatosis.

Otherwise negative abdominal ultrasound.

## 2016-12-22 DIAGNOSIS — J439 Emphysema, unspecified: Secondary | ICD-10-CM | POA: Diagnosis not present

## 2016-12-22 DIAGNOSIS — J9610 Chronic respiratory failure, unspecified whether with hypoxia or hypercapnia: Secondary | ICD-10-CM | POA: Diagnosis not present

## 2016-12-22 DIAGNOSIS — R0902 Hypoxemia: Secondary | ICD-10-CM | POA: Diagnosis not present

## 2016-12-22 DIAGNOSIS — J449 Chronic obstructive pulmonary disease, unspecified: Secondary | ICD-10-CM | POA: Diagnosis not present

## 2016-12-26 ENCOUNTER — Emergency Department (HOSPITAL_COMMUNITY): Payer: Medicare Other

## 2016-12-26 ENCOUNTER — Emergency Department (HOSPITAL_COMMUNITY)
Admission: EM | Admit: 2016-12-26 | Discharge: 2016-12-26 | Disposition: A | Discharge status: Home / Self Care | Payer: Medicare Other | Attending: Emergency Medicine | Admitting: Emergency Medicine

## 2016-12-26 ENCOUNTER — Encounter (HOSPITAL_COMMUNITY): Payer: Self-pay | Admitting: Emergency Medicine

## 2016-12-26 DIAGNOSIS — J45909 Unspecified asthma, uncomplicated: Secondary | ICD-10-CM | POA: Diagnosis not present

## 2016-12-26 DIAGNOSIS — R0789 Other chest pain: Secondary | ICD-10-CM | POA: Diagnosis not present

## 2016-12-26 DIAGNOSIS — Z7984 Long term (current) use of oral hypoglycemic drugs: Secondary | ICD-10-CM | POA: Insufficient documentation

## 2016-12-26 DIAGNOSIS — R0602 Shortness of breath: Secondary | ICD-10-CM | POA: Insufficient documentation

## 2016-12-26 DIAGNOSIS — I2510 Atherosclerotic heart disease of native coronary artery without angina pectoris: Secondary | ICD-10-CM | POA: Insufficient documentation

## 2016-12-26 DIAGNOSIS — J449 Chronic obstructive pulmonary disease, unspecified: Secondary | ICD-10-CM | POA: Insufficient documentation

## 2016-12-26 DIAGNOSIS — E119 Type 2 diabetes mellitus without complications: Secondary | ICD-10-CM | POA: Diagnosis not present

## 2016-12-26 DIAGNOSIS — Z79899 Other long term (current) drug therapy: Secondary | ICD-10-CM | POA: Insufficient documentation

## 2016-12-26 DIAGNOSIS — Z87891 Personal history of nicotine dependence: Secondary | ICD-10-CM | POA: Diagnosis not present

## 2016-12-26 DIAGNOSIS — R079 Chest pain, unspecified: Secondary | ICD-10-CM | POA: Diagnosis not present

## 2016-12-26 LAB — CBC
HEMATOCRIT: 40.9 % (ref 39.0–52.0)
HEMOGLOBIN: 13.9 g/dL (ref 13.0–17.0)
MCH: 29.4 pg (ref 26.0–34.0)
MCHC: 34 g/dL (ref 30.0–36.0)
MCV: 86.7 fL (ref 78.0–100.0)
Platelets: 199 10*3/uL (ref 150–400)
RBC: 4.72 MIL/uL (ref 4.22–5.81)
RDW: 12.9 % (ref 11.5–15.5)
WBC: 6.2 10*3/uL (ref 4.0–10.5)

## 2016-12-26 LAB — BASIC METABOLIC PANEL
ANION GAP: 10 (ref 5–15)
BUN: 13 mg/dL (ref 6–20)
CHLORIDE: 102 mmol/L (ref 101–111)
CO2: 27 mmol/L (ref 22–32)
Calcium: 9 mg/dL (ref 8.9–10.3)
Creatinine, Ser: 1.02 mg/dL (ref 0.61–1.24)
Glucose, Bld: 146 mg/dL — ABNORMAL HIGH (ref 65–99)
POTASSIUM: 3.4 mmol/L — AB (ref 3.5–5.1)
SODIUM: 139 mmol/L (ref 135–145)

## 2016-12-26 LAB — TROPONIN I: Troponin I: <0.03 ng/mL (ref <0.03)

## 2016-12-26 MED ORDER — HYDROCODONE-ACETAMINOPHEN 5-325 MG PO TABS: 1.0000 | ORAL_TABLET | Freq: Four times a day (QID) | ORAL | 0 refills | Status: DC | PRN

## 2016-12-26 MED ORDER — PREDNISONE 20 MG PO TABS: 40.0000 mg | ORAL_TABLET | Freq: Every day | ORAL | 0 refills | Status: DC

## 2016-12-26 MED ORDER — METHYLPREDNISOLONE SODIUM SUCC 125 MG IJ SOLR
125.0000 mg | Freq: Once | INTRAMUSCULAR | Status: AC
  Administered 2016-12-26: 125 mg via INTRAVENOUS
  Filled 2016-12-26: qty 2

## 2016-12-26 MED ORDER — LEVALBUTEROL HCL 0.63 MG/3ML IN NEBU
0.6300 mg | INHALATION_SOLUTION | Freq: Once | RESPIRATORY_TRACT | Status: AC
  Administered 2016-12-26: 0.63 mg via RESPIRATORY_TRACT
  Filled 2016-12-26: qty 3

## 2016-12-26 MED ORDER — ALBUTEROL SULFATE (2.5 MG/3ML) 0.083% IN NEBU
5.0000 mg | INHALATION_SOLUTION | Freq: Once | RESPIRATORY_TRACT | Status: DC
  Filled 2016-12-26: qty 6

## 2016-12-26 MED ORDER — MORPHINE SULFATE (PF) 4 MG/ML IV SOLN
4.0000 mg | Freq: Once | INTRAVENOUS | Status: AC
  Administered 2016-12-26: 4 mg via INTRAVENOUS
  Filled 2016-12-26: qty 1

## 2016-12-26 NOTE — ED Triage Notes (Signed)
Patient c/o left and right chest pain with shortness of breath, nausea, vomiting, and lightheadedness. Patient reports productive cough with tick yellow sputum. Unsure of any fevers but states "has been hot to touch." Patient has hx of afib and COPD. Per patient wears 8 liters of oxygen at home via [place] at home.

## 2016-12-26 NOTE — ED Notes (Signed)
Pt returned from xray

## 2016-12-26 NOTE — ED Notes (Signed)
Pt taken to xray. RT at bedside

## 2016-12-26 NOTE — ED Notes (Signed)
Iv 20g to left fa removed and cath intact. Nad.

## 2016-12-26 NOTE — Discharge Instructions (Signed)
As discussed, your evaluation today has been largely reassuring.  But, it is important that you monitor your condition carefully, and do not hesitate to return to the ED if you develop new, or concerning changes in your condition. ? ?Otherwise, please follow-up with your physician for appropriate ongoing care. ? ?
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
## 2016-12-26 NOTE — ED Notes (Signed)
Rt aware of breathing tx. Nad.

## 2017-01-21 DIAGNOSIS — J439 Emphysema, unspecified: Secondary | ICD-10-CM | POA: Diagnosis not present

## 2017-01-21 DIAGNOSIS — R0902 Hypoxemia: Secondary | ICD-10-CM | POA: Diagnosis not present

## 2017-01-21 DIAGNOSIS — J9610 Chronic respiratory failure, unspecified whether with hypoxia or hypercapnia: Secondary | ICD-10-CM | POA: Diagnosis not present

## 2017-01-21 DIAGNOSIS — J449 Chronic obstructive pulmonary disease, unspecified: Secondary | ICD-10-CM | POA: Diagnosis not present

## 2017-01-28 DIAGNOSIS — E119 Type 2 diabetes mellitus without complications: Secondary | ICD-10-CM | POA: Diagnosis not present

## 2017-01-28 DIAGNOSIS — I482 Chronic atrial fibrillation: Secondary | ICD-10-CM | POA: Diagnosis not present

## 2017-01-28 DIAGNOSIS — A318 Other mycobacterial infections: Secondary | ICD-10-CM | POA: Diagnosis not present

## 2017-01-28 DIAGNOSIS — J9611 Chronic respiratory failure with hypoxia: Secondary | ICD-10-CM | POA: Diagnosis not present

## 2017-01-29 ENCOUNTER — Other Ambulatory Visit (HOSPITAL_COMMUNITY): Payer: Self-pay | Admitting: Pulmonary Disease

## 2017-01-29 DIAGNOSIS — R911 Solitary pulmonary nodule: Secondary | ICD-10-CM

## 2017-02-12 ENCOUNTER — Ambulatory Visit (HOSPITAL_COMMUNITY)
Admission: RE | Admit: 2017-02-12 | Discharge: 2017-02-12 | Disposition: A | Discharge status: Home / Self Care | Payer: Medicare Other | Source: Ambulatory Visit | Attending: Pulmonary Disease | Admitting: Pulmonary Disease

## 2017-02-12 ENCOUNTER — Encounter (HOSPITAL_COMMUNITY): Payer: Self-pay

## 2017-02-12 DIAGNOSIS — I700 Atherosclerosis of aorta: Secondary | ICD-10-CM | POA: Insufficient documentation

## 2017-02-12 DIAGNOSIS — J984 Other disorders of lung: Secondary | ICD-10-CM | POA: Diagnosis not present

## 2017-02-12 DIAGNOSIS — R911 Solitary pulmonary nodule: Secondary | ICD-10-CM

## 2017-02-12 DIAGNOSIS — R918 Other nonspecific abnormal finding of lung field: Secondary | ICD-10-CM | POA: Insufficient documentation

## 2017-02-12 DIAGNOSIS — J439 Emphysema, unspecified: Secondary | ICD-10-CM | POA: Insufficient documentation

## 2017-02-12 LAB — POCT I-STAT CREATININE: Creatinine, Ser: 1.1 mg/dL (ref 0.61–1.24)

## 2017-02-12 MED ORDER — IOPAMIDOL (ISOVUE-300) INJECTION 61%
75.0000 mL | Freq: Once | INTRAVENOUS | Status: AC | PRN
  Administered 2017-02-12: 75 mL via INTRAVENOUS

## 2017-02-14 IMAGING — CT CT CHEST W/ CM
2 of 3 series · 15 of 36 positions shown, 18 images · IV contrast (Omnipaque 300)
Comparison: Chest CTs dated 10/20/2013, 05/01/2015 and 08/29/2015.

CLINICAL DATA: Follow-up cavitary lung lesion. History of seizures,
diabetes and smoking. No given history of malignancy.

EXAM:
CT CHEST WITH CONTRAST
TECHNIQUE: Multidetector CT imaging of the chest was performed during
intravenous contrast administration.
CONTRAST:  75mL YVQP2C-NHH IOPAMIDOL (YVQP2C-NHH) INJECTION 61%

[Series 2: chestroutine 2.0 b40f · axial · 0.65mm/px · z∈[+862,+1142]mm · 12 of 166 slices shown, 15 images]
[im 13/166  mediastinal]
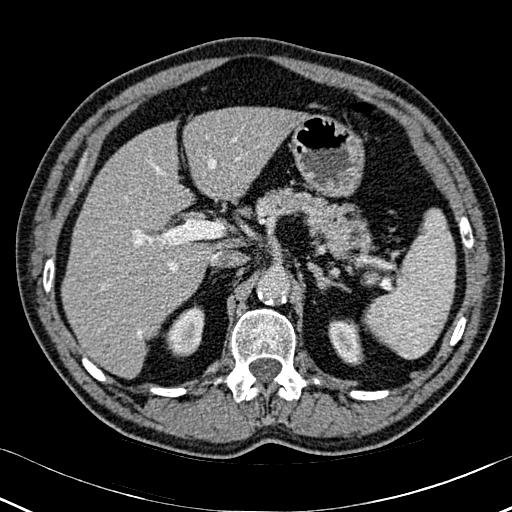
[im 13/166  lung]
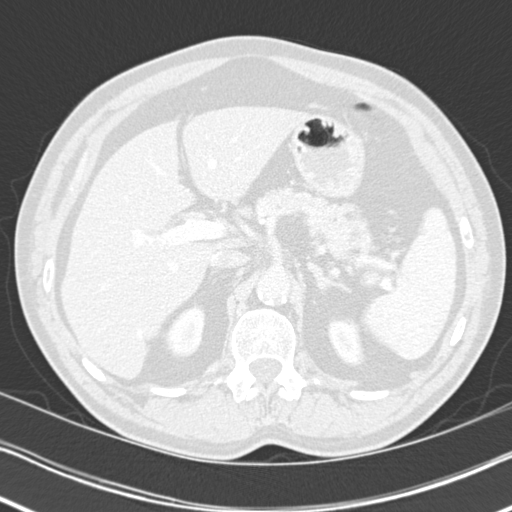
[im 25/166  lung]
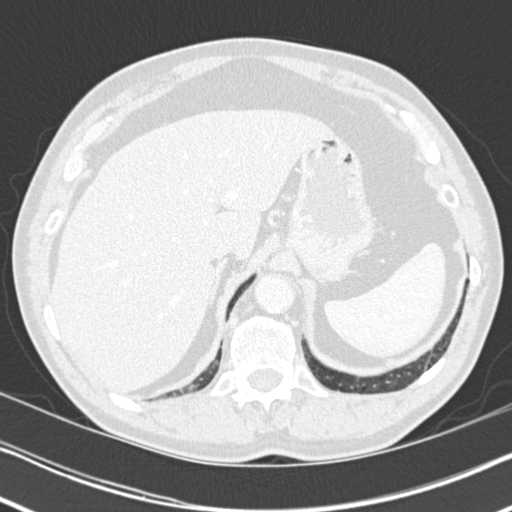
[im 37/166  lung]
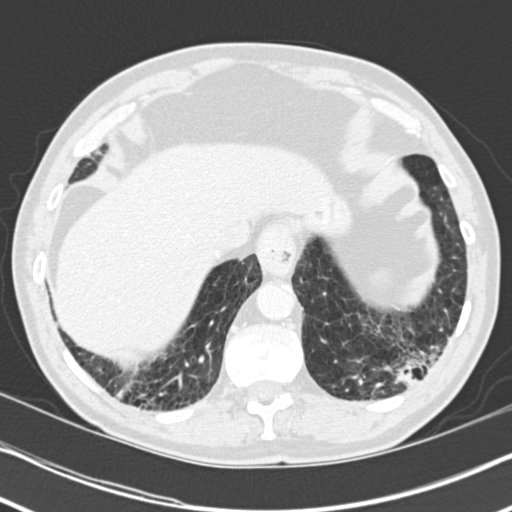
[im 49/166  lung]
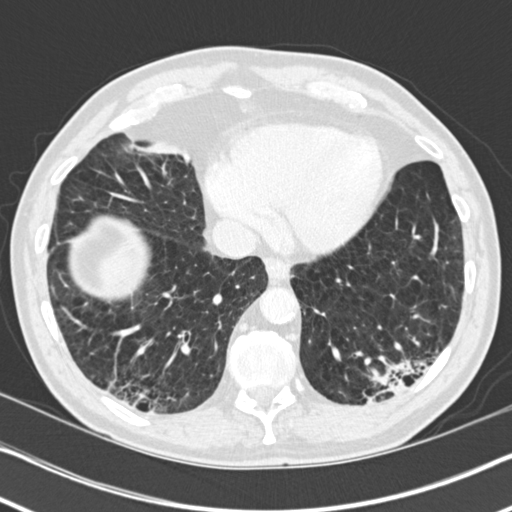
[im 62/166  mediastinal]
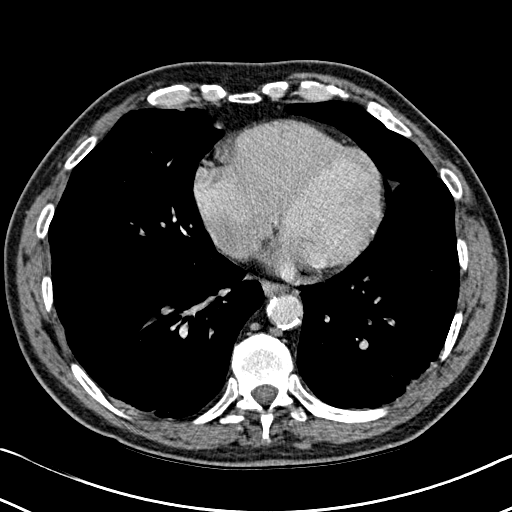
[im 62/166  lung]
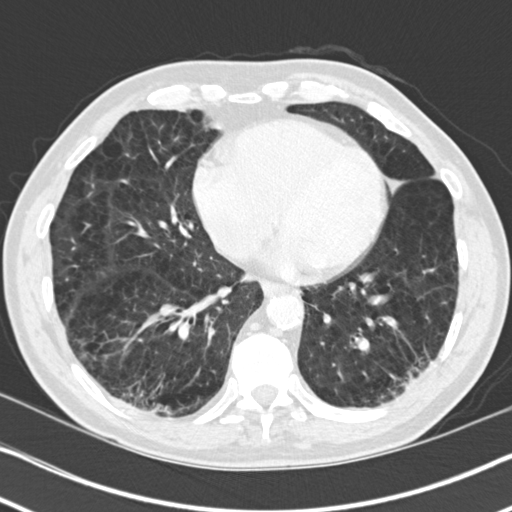
[im 74/166  lung]
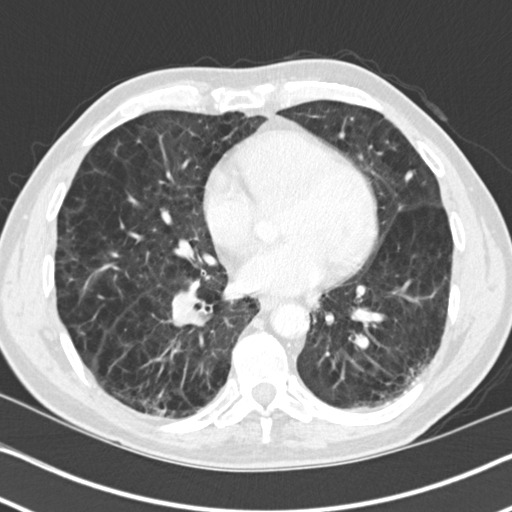
[im 92/166  lung]
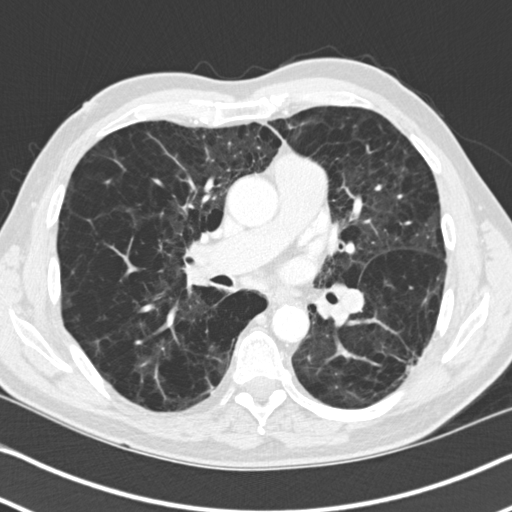
[im 104/166  lung]
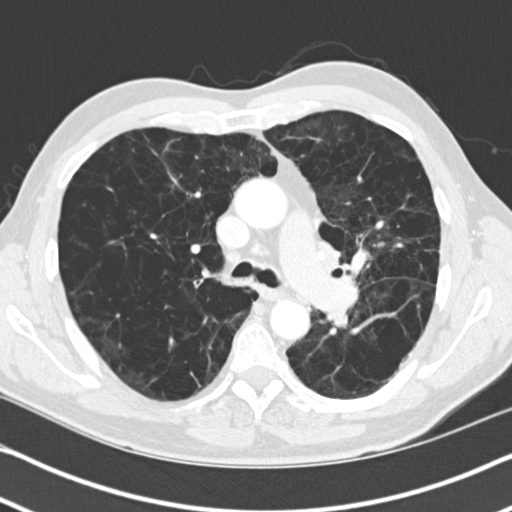
[im 117/166  mediastinal]
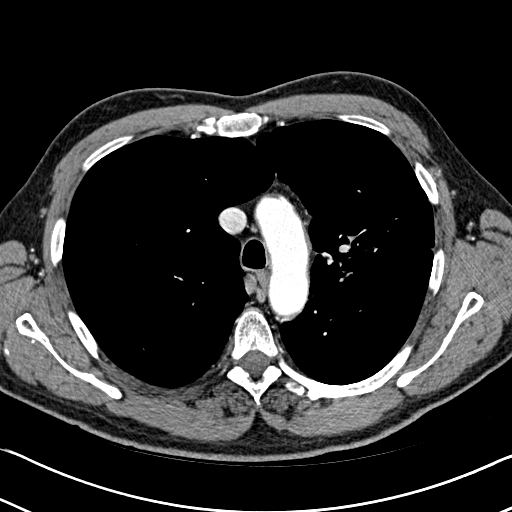
[im 117/166  lung]
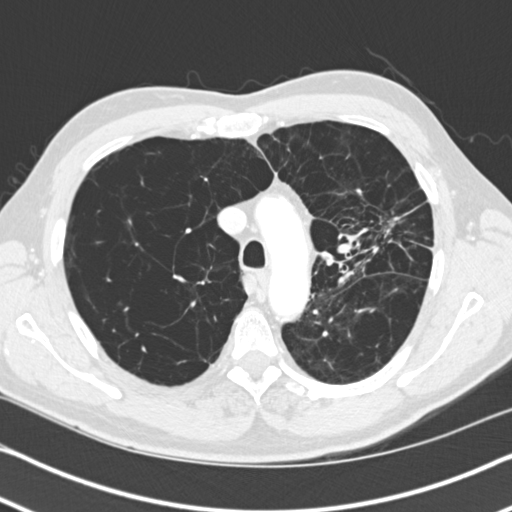
[im 129/166  lung]
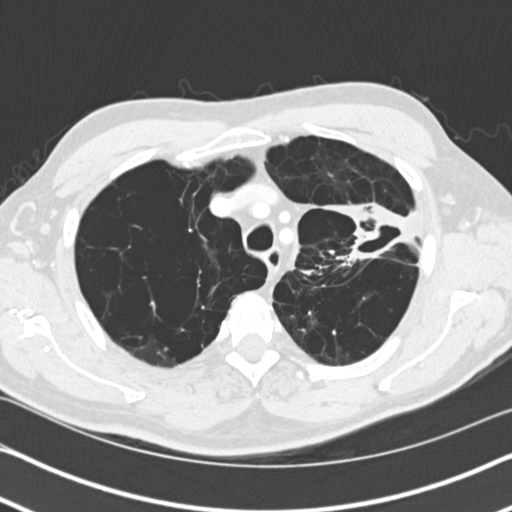
[im 141/166  lung]
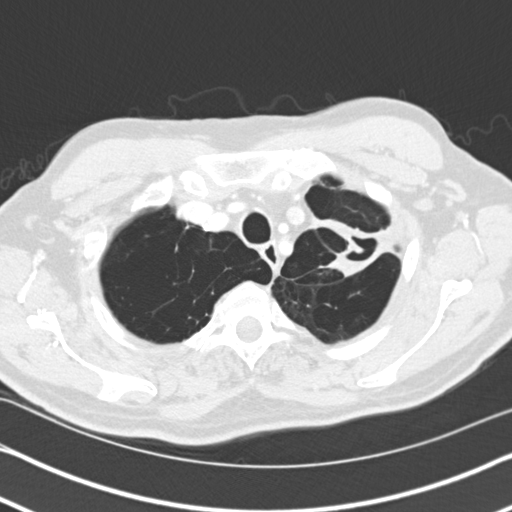
[im 153/166  lung]
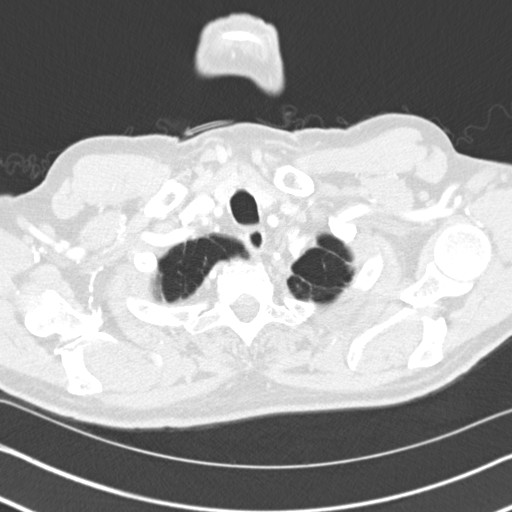

[Series 4: mpr coronal chest 3mm · coronal · 0.67mm/px · 3 of 148 slices shown]
[im 30/148  lung]
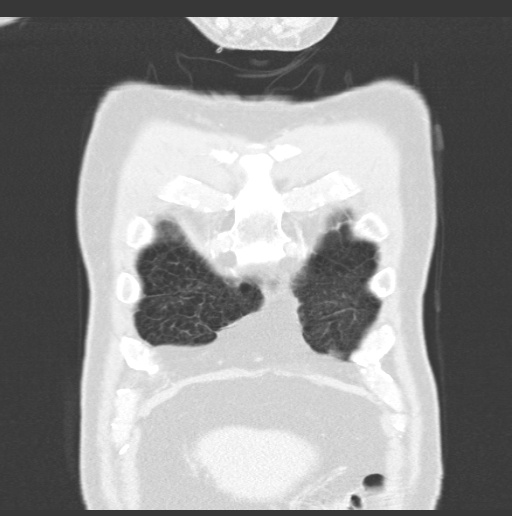
[im 59/148  lung]
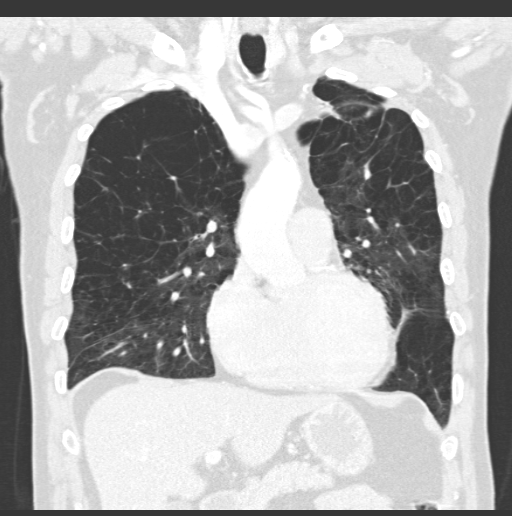
[im 89/148  lung]
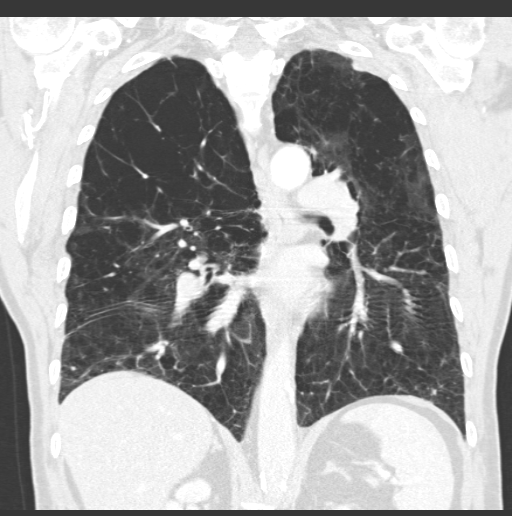

[15 of 36 positions shown; findings below may reference images not displayed]

FINDINGS: Mediastinum/Nodes: There are no enlarged mediastinal, hilar or
axillary lymph nodes.Mildly prominent axillary lymph nodes with
retain fatty hila are unchanged. There is a small hiatal hernia. The
heart size is normal. There is no pericardial effusion. There is
diffuse atherosclerosis of the aorta, great vessels and coronary
arteries.

Lungs/Pleura: There is no pleural effusion. Severe upper lobe
predominant emphysema again noted. Subpleural honeycomb formation
posteriorly in both lower lobes is similar to the prior study. The
large irregular cavitary left upper lobe lesion remains difficult to
measure given its irregular shape. However, at the level of the
cavitary component, this measures approximately 5.1 x 3.3 x 6.2 cm
which appears mildly improved from the prior study. This has
persistent thickened walls without enlarging soft tissue components.
However, separate from this lesion, there is a new somewhat
irregular nodular density medially in the left upper lobe, measuring
1.8 x 0.9 cm on axial image 42.

Upper abdomen: The visualized upper abdomen has a stable appearance
without suspicious findings.

Musculoskeletal/Chest wall: Probable nondisplaced fracture of the
right second rib laterally. No lytic lesion or other focal osseous
abnormalities identified.
IMPRESSION: 1. Chronic irregular cavitary left upper lobe lesion, little changed
from prior examination of 2 years ago and mildly improved from most
recent study of 3 months ago. This appearance remains most
consistent with a chronic inflammatory process.
2. New irregular nodular density more medially in the left upper
lobe, possibly inflammatory as well. Chest CT follow-up in 6 months
recommended to exclude developing neoplasm.
3. Severe chronic lung disease with emphysema and subpleural
honeycomb formation in both lower lobes.
4. No adenopathy.

## 2017-02-21 DIAGNOSIS — R0902 Hypoxemia: Secondary | ICD-10-CM | POA: Diagnosis not present

## 2017-02-21 DIAGNOSIS — J439 Emphysema, unspecified: Secondary | ICD-10-CM | POA: Diagnosis not present

## 2017-02-21 DIAGNOSIS — J449 Chronic obstructive pulmonary disease, unspecified: Secondary | ICD-10-CM | POA: Diagnosis not present

## 2017-02-21 DIAGNOSIS — J9610 Chronic respiratory failure, unspecified whether with hypoxia or hypercapnia: Secondary | ICD-10-CM | POA: Diagnosis not present

## 2017-03-04 DIAGNOSIS — A318 Other mycobacterial infections: Secondary | ICD-10-CM | POA: Diagnosis not present

## 2017-03-04 DIAGNOSIS — I482 Chronic atrial fibrillation: Secondary | ICD-10-CM | POA: Diagnosis not present

## 2017-03-04 DIAGNOSIS — J449 Chronic obstructive pulmonary disease, unspecified: Secondary | ICD-10-CM | POA: Diagnosis not present

## 2017-03-04 DIAGNOSIS — J9611 Chronic respiratory failure with hypoxia: Secondary | ICD-10-CM | POA: Diagnosis not present

## 2017-03-04 DIAGNOSIS — E119 Type 2 diabetes mellitus without complications: Secondary | ICD-10-CM | POA: Diagnosis not present

## 2017-03-24 DIAGNOSIS — J9610 Chronic respiratory failure, unspecified whether with hypoxia or hypercapnia: Secondary | ICD-10-CM | POA: Diagnosis not present

## 2017-03-24 DIAGNOSIS — J449 Chronic obstructive pulmonary disease, unspecified: Secondary | ICD-10-CM | POA: Diagnosis not present

## 2017-03-24 DIAGNOSIS — R0902 Hypoxemia: Secondary | ICD-10-CM | POA: Diagnosis not present

## 2017-03-24 DIAGNOSIS — J439 Emphysema, unspecified: Secondary | ICD-10-CM | POA: Diagnosis not present

## 2017-04-23 DIAGNOSIS — J439 Emphysema, unspecified: Secondary | ICD-10-CM | POA: Diagnosis not present

## 2017-04-23 DIAGNOSIS — J9610 Chronic respiratory failure, unspecified whether with hypoxia or hypercapnia: Secondary | ICD-10-CM | POA: Diagnosis not present

## 2017-04-23 DIAGNOSIS — R0902 Hypoxemia: Secondary | ICD-10-CM | POA: Diagnosis not present

## 2017-04-23 DIAGNOSIS — J449 Chronic obstructive pulmonary disease, unspecified: Secondary | ICD-10-CM | POA: Diagnosis not present

## 2017-05-24 DIAGNOSIS — J9610 Chronic respiratory failure, unspecified whether with hypoxia or hypercapnia: Secondary | ICD-10-CM | POA: Diagnosis not present

## 2017-05-24 DIAGNOSIS — R0902 Hypoxemia: Secondary | ICD-10-CM | POA: Diagnosis not present

## 2017-05-24 DIAGNOSIS — J449 Chronic obstructive pulmonary disease, unspecified: Secondary | ICD-10-CM | POA: Diagnosis not present

## 2017-05-24 DIAGNOSIS — J439 Emphysema, unspecified: Secondary | ICD-10-CM | POA: Diagnosis not present

## 2017-06-08 DIAGNOSIS — E119 Type 2 diabetes mellitus without complications: Secondary | ICD-10-CM | POA: Diagnosis not present

## 2017-06-08 DIAGNOSIS — J9611 Chronic respiratory failure with hypoxia: Secondary | ICD-10-CM | POA: Diagnosis not present

## 2017-06-08 DIAGNOSIS — I482 Chronic atrial fibrillation: Secondary | ICD-10-CM | POA: Diagnosis not present

## 2017-06-08 DIAGNOSIS — A318 Other mycobacterial infections: Secondary | ICD-10-CM | POA: Diagnosis not present

## 2017-06-09 ENCOUNTER — Emergency Department (HOSPITAL_COMMUNITY): Payer: Medicare Other

## 2017-06-09 ENCOUNTER — Other Ambulatory Visit: Payer: Self-pay

## 2017-06-09 ENCOUNTER — Encounter (HOSPITAL_COMMUNITY): Payer: Self-pay | Admitting: *Deleted

## 2017-06-09 ENCOUNTER — Emergency Department (HOSPITAL_COMMUNITY)
Admission: EM | Admit: 2017-06-09 | Discharge: 2017-06-09 | Disposition: A | Discharge status: Home / Self Care | Payer: Medicare Other | Attending: Emergency Medicine | Admitting: Emergency Medicine

## 2017-06-09 DIAGNOSIS — Z7984 Long term (current) use of oral hypoglycemic drugs: Secondary | ICD-10-CM | POA: Diagnosis not present

## 2017-06-09 DIAGNOSIS — J45909 Unspecified asthma, uncomplicated: Secondary | ICD-10-CM | POA: Insufficient documentation

## 2017-06-09 DIAGNOSIS — I2510 Atherosclerotic heart disease of native coronary artery without angina pectoris: Secondary | ICD-10-CM | POA: Insufficient documentation

## 2017-06-09 DIAGNOSIS — J449 Chronic obstructive pulmonary disease, unspecified: Secondary | ICD-10-CM | POA: Diagnosis not present

## 2017-06-09 DIAGNOSIS — H5347 Heteronymous bilateral field defects: Secondary | ICD-10-CM | POA: Diagnosis not present

## 2017-06-09 DIAGNOSIS — E119 Type 2 diabetes mellitus without complications: Secondary | ICD-10-CM | POA: Diagnosis not present

## 2017-06-09 DIAGNOSIS — H26491 Other secondary cataract, right eye: Secondary | ICD-10-CM | POA: Diagnosis not present

## 2017-06-09 DIAGNOSIS — A310 Pulmonary mycobacterial infection: Secondary | ICD-10-CM | POA: Diagnosis not present

## 2017-06-09 DIAGNOSIS — Z87891 Personal history of nicotine dependence: Secondary | ICD-10-CM | POA: Insufficient documentation

## 2017-06-09 DIAGNOSIS — H538 Other visual disturbances: Secondary | ICD-10-CM | POA: Diagnosis not present

## 2017-06-09 DIAGNOSIS — R51 Headache: Secondary | ICD-10-CM | POA: Diagnosis not present

## 2017-06-09 DIAGNOSIS — Z79899 Other long term (current) drug therapy: Secondary | ICD-10-CM | POA: Insufficient documentation

## 2017-06-09 DIAGNOSIS — Z961 Presence of intraocular lens: Secondary | ICD-10-CM | POA: Diagnosis not present

## 2017-06-09 NOTE — ED Notes (Signed)
Pt states that he is visiting the ED per doctor's orders for an MRI to look at both eyes. Pt resting comfortably.
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
## 2017-06-09 NOTE — ED Triage Notes (Signed)
Pt with blurry vision for a month. Seen eye doctor today and was sent here for MRI.

## 2017-06-09 NOTE — Discharge Instructions (Signed)
Go for the MRI scan - we are trying to set it up for tomorrow. You will need to see Dr. Luan Pulling 1-2 days after the scan.

## 2017-06-09 NOTE — ED Notes (Signed)
Pt on 6L O2

## 2017-06-09 NOTE — ED Notes (Signed)
Per pt. Sent here by PCP for mri to rule out a brain tumor or stroke. Blurry vision in both eyes for the last month

## 2017-06-11 ENCOUNTER — Ambulatory Visit (HOSPITAL_COMMUNITY): Payer: Medicare Other

## 2017-06-11 DIAGNOSIS — I482 Chronic atrial fibrillation: Secondary | ICD-10-CM | POA: Diagnosis not present

## 2017-06-11 DIAGNOSIS — A318 Other mycobacterial infections: Secondary | ICD-10-CM | POA: Diagnosis not present

## 2017-06-11 DIAGNOSIS — J449 Chronic obstructive pulmonary disease, unspecified: Secondary | ICD-10-CM | POA: Diagnosis not present

## 2017-06-11 DIAGNOSIS — E1165 Type 2 diabetes mellitus with hyperglycemia: Secondary | ICD-10-CM | POA: Diagnosis not present

## 2017-06-15 ENCOUNTER — Ambulatory Visit (HOSPITAL_COMMUNITY)
Admission: RE | Admit: 2017-06-15 | Discharge: 2017-06-15 | Disposition: A | Discharge status: Home / Self Care | Payer: Medicare Other | Source: Ambulatory Visit | Attending: Emergency Medicine | Admitting: Emergency Medicine

## 2017-06-15 DIAGNOSIS — H538 Other visual disturbances: Secondary | ICD-10-CM | POA: Insufficient documentation

## 2017-06-15 LAB — POCT I-STAT CREATININE: Creatinine, Ser: 1 mg/dL (ref 0.61–1.24)

## 2017-06-15 MED ORDER — GADOBENATE DIMEGLUMINE 529 MG/ML IV SOLN
15.0000 mL | Freq: Once | INTRAVENOUS | Status: AC | PRN
  Administered 2017-06-15: 13 mL via INTRAVENOUS

## 2017-06-23 DIAGNOSIS — J439 Emphysema, unspecified: Secondary | ICD-10-CM | POA: Diagnosis not present

## 2017-06-23 DIAGNOSIS — J9610 Chronic respiratory failure, unspecified whether with hypoxia or hypercapnia: Secondary | ICD-10-CM | POA: Diagnosis not present

## 2017-06-23 DIAGNOSIS — J449 Chronic obstructive pulmonary disease, unspecified: Secondary | ICD-10-CM | POA: Diagnosis not present

## 2017-06-23 DIAGNOSIS — R0902 Hypoxemia: Secondary | ICD-10-CM | POA: Diagnosis not present

## 2017-07-03 ENCOUNTER — Encounter (HOSPITAL_COMMUNITY): Payer: Self-pay | Admitting: Emergency Medicine

## 2017-07-03 ENCOUNTER — Other Ambulatory Visit: Payer: Self-pay

## 2017-07-03 ENCOUNTER — Emergency Department (HOSPITAL_COMMUNITY)
Admission: EM | Admit: 2017-07-03 | Discharge: 2017-07-03 | Disposition: A | Discharge status: Home / Self Care | Payer: Medicare Other | Attending: Emergency Medicine | Admitting: Emergency Medicine

## 2017-07-03 ENCOUNTER — Emergency Department (HOSPITAL_COMMUNITY): Payer: Medicare Other

## 2017-07-03 DIAGNOSIS — R0602 Shortness of breath: Secondary | ICD-10-CM | POA: Diagnosis not present

## 2017-07-03 DIAGNOSIS — E119 Type 2 diabetes mellitus without complications: Secondary | ICD-10-CM | POA: Insufficient documentation

## 2017-07-03 DIAGNOSIS — I2510 Atherosclerotic heart disease of native coronary artery without angina pectoris: Secondary | ICD-10-CM | POA: Diagnosis not present

## 2017-07-03 DIAGNOSIS — Z9981 Dependence on supplemental oxygen: Secondary | ICD-10-CM | POA: Insufficient documentation

## 2017-07-03 DIAGNOSIS — Z7984 Long term (current) use of oral hypoglycemic drugs: Secondary | ICD-10-CM | POA: Diagnosis not present

## 2017-07-03 DIAGNOSIS — J441 Chronic obstructive pulmonary disease with (acute) exacerbation: Secondary | ICD-10-CM | POA: Diagnosis not present

## 2017-07-03 DIAGNOSIS — J45909 Unspecified asthma, uncomplicated: Secondary | ICD-10-CM | POA: Insufficient documentation

## 2017-07-03 DIAGNOSIS — Z79899 Other long term (current) drug therapy: Secondary | ICD-10-CM | POA: Diagnosis not present

## 2017-07-03 DIAGNOSIS — F17210 Nicotine dependence, cigarettes, uncomplicated: Secondary | ICD-10-CM | POA: Insufficient documentation

## 2017-07-03 DIAGNOSIS — R05 Cough: Secondary | ICD-10-CM | POA: Diagnosis not present

## 2017-07-03 LAB — COMPREHENSIVE METABOLIC PANEL
ALT: 19 U/L (ref 17–63)
AST: 20 U/L (ref 15–41)
Albumin: 3.9 g/dL (ref 3.5–5.0)
Alkaline Phosphatase: 70 U/L (ref 38–126)
Anion gap: 12 (ref 5–15)
BUN: 12 mg/dL (ref 6–20)
CHLORIDE: 100 mmol/L — AB (ref 101–111)
CO2: 27 mmol/L (ref 22–32)
CREATININE: 0.86 mg/dL (ref 0.61–1.24)
Calcium: 9.1 mg/dL (ref 8.9–10.3)
GFR calc non Af Amer: >60 mL/min (ref >60)
Glucose, Bld: 88 mg/dL (ref 65–99)
Potassium: 4.3 mmol/L (ref 3.5–5.1)
SODIUM: 139 mmol/L (ref 135–145)
Total Bilirubin: 0.5 mg/dL (ref 0.3–1.2)
Total Protein: 7.4 g/dL (ref 6.5–8.1)

## 2017-07-03 LAB — CBC WITH DIFFERENTIAL/PLATELET
Basophils Absolute: 0.1 10*3/uL (ref 0.0–0.1)
Basophils Relative: 1 %
EOS ABS: 0.3 10*3/uL (ref 0.0–0.7)
Eosinophils Relative: 5 %
HEMATOCRIT: 39.9 % (ref 39.0–52.0)
HEMOGLOBIN: 13 g/dL (ref 13.0–17.0)
LYMPHS ABS: 1.8 10*3/uL (ref 0.7–4.0)
LYMPHS PCT: 27 %
MCH: 28.3 pg (ref 26.0–34.0)
MCHC: 32.6 g/dL (ref 30.0–36.0)
MCV: 86.9 fL (ref 78.0–100.0)
MONOS PCT: 10 %
Monocytes Absolute: 0.7 10*3/uL (ref 0.1–1.0)
NEUTROS PCT: 57 %
Neutro Abs: 4 10*3/uL (ref 1.7–7.7)
Platelets: 207 10*3/uL (ref 150–400)
RBC: 4.59 MIL/uL (ref 4.22–5.81)
RDW: 13 % (ref 11.5–15.5)
WBC: 6.9 10*3/uL (ref 4.0–10.5)

## 2017-07-03 LAB — PROTIME-INR
INR: 0.95
Prothrombin Time: 12.5 seconds (ref 11.4–15.2)

## 2017-07-03 LAB — LIPASE, BLOOD: LIPASE: 19 U/L (ref 11–51)

## 2017-07-03 LAB — TROPONIN I: Troponin I: <0.03 ng/mL (ref <0.03)

## 2017-07-03 MED ORDER — PREDNISONE 50 MG PO TABS: ORAL_TABLET | ORAL | 0 refills | Status: DC

## 2017-07-03 MED ORDER — HYDROCODONE-ACETAMINOPHEN 5-325 MG PO TABS
1.0000 | ORAL_TABLET | Freq: Once | ORAL | Status: AC
  Administered 2017-07-03: 1 via ORAL
  Filled 2017-07-03: qty 1

## 2017-07-03 MED ORDER — PREDNISONE 50 MG PO TABS
60.0000 mg | ORAL_TABLET | Freq: Once | ORAL | Status: AC
  Administered 2017-07-03: 60 mg via ORAL
  Filled 2017-07-03: qty 1

## 2017-07-03 MED ORDER — LEVALBUTEROL HCL 1.25 MG/0.5ML IN NEBU
1.2500 mg | INHALATION_SOLUTION | Freq: Once | RESPIRATORY_TRACT | Status: AC
  Administered 2017-07-03: 1.25 mg via RESPIRATORY_TRACT
  Filled 2017-07-03: qty 0.5

## 2017-07-03 NOTE — Discharge Instructions (Signed)
Take your next dose of prednisone tomorrow evening.  Make sure you are using your home Xopenex nebulizer treatments if you are wheezing or shortness of breath returns.  Return here for any worsening symptoms or call your primary doctor for further management.

## 2017-07-03 NOTE — ED Notes (Signed)
RT notified of xopenex neb.
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
## 2017-07-03 NOTE — ED Notes (Signed)
Pt left with family. Pt was not driving.

## 2017-07-03 NOTE — ED Notes (Signed)
Pt.'s breathing is easier than when pt came in. Pt.'s sats are 100% and respirations are no labored. Is complaining of pain in shoulder. He is complaining of pain in shoulder, back, and head. Pt is able to talk in full sentences.

## 2017-07-03 NOTE — ED Notes (Signed)
Pt in radiology at this time. 

## 2017-07-03 NOTE — ED Notes (Signed)
Attempted to help pt urinate in the urinal, pt couldn't and said he would notify staff when he needed to.

## 2017-07-03 NOTE — ED Triage Notes (Signed)
  Pt on home O2, reports that he is having shortness of breath Also complains of shoulder pain Also complains of dizziness Continues to smoke

## 2017-07-15 DIAGNOSIS — E119 Type 2 diabetes mellitus without complications: Secondary | ICD-10-CM | POA: Diagnosis not present

## 2017-07-15 DIAGNOSIS — H5347 Heteronymous bilateral field defects: Secondary | ICD-10-CM | POA: Diagnosis not present

## 2017-07-24 DIAGNOSIS — J439 Emphysema, unspecified: Secondary | ICD-10-CM | POA: Diagnosis not present

## 2017-07-24 DIAGNOSIS — J449 Chronic obstructive pulmonary disease, unspecified: Secondary | ICD-10-CM | POA: Diagnosis not present

## 2017-07-24 DIAGNOSIS — R0902 Hypoxemia: Secondary | ICD-10-CM | POA: Diagnosis not present

## 2017-07-24 DIAGNOSIS — J9610 Chronic respiratory failure, unspecified whether with hypoxia or hypercapnia: Secondary | ICD-10-CM | POA: Diagnosis not present

## 2017-08-17 IMAGING — DX DG CHEST 2V
2 series · 2 of 2 positions shown · non-contrast
Comparison: CT chest 11/28/2015 and 08/29/2015. PA and lateral
chest 08/29/2015 and 07/01/2015.

CLINICAL DATA: History of cavitary lung lesion in a smoker.
Subsequent encounter.

EXAM:
CHEST  2 VIEW

[chest lat]
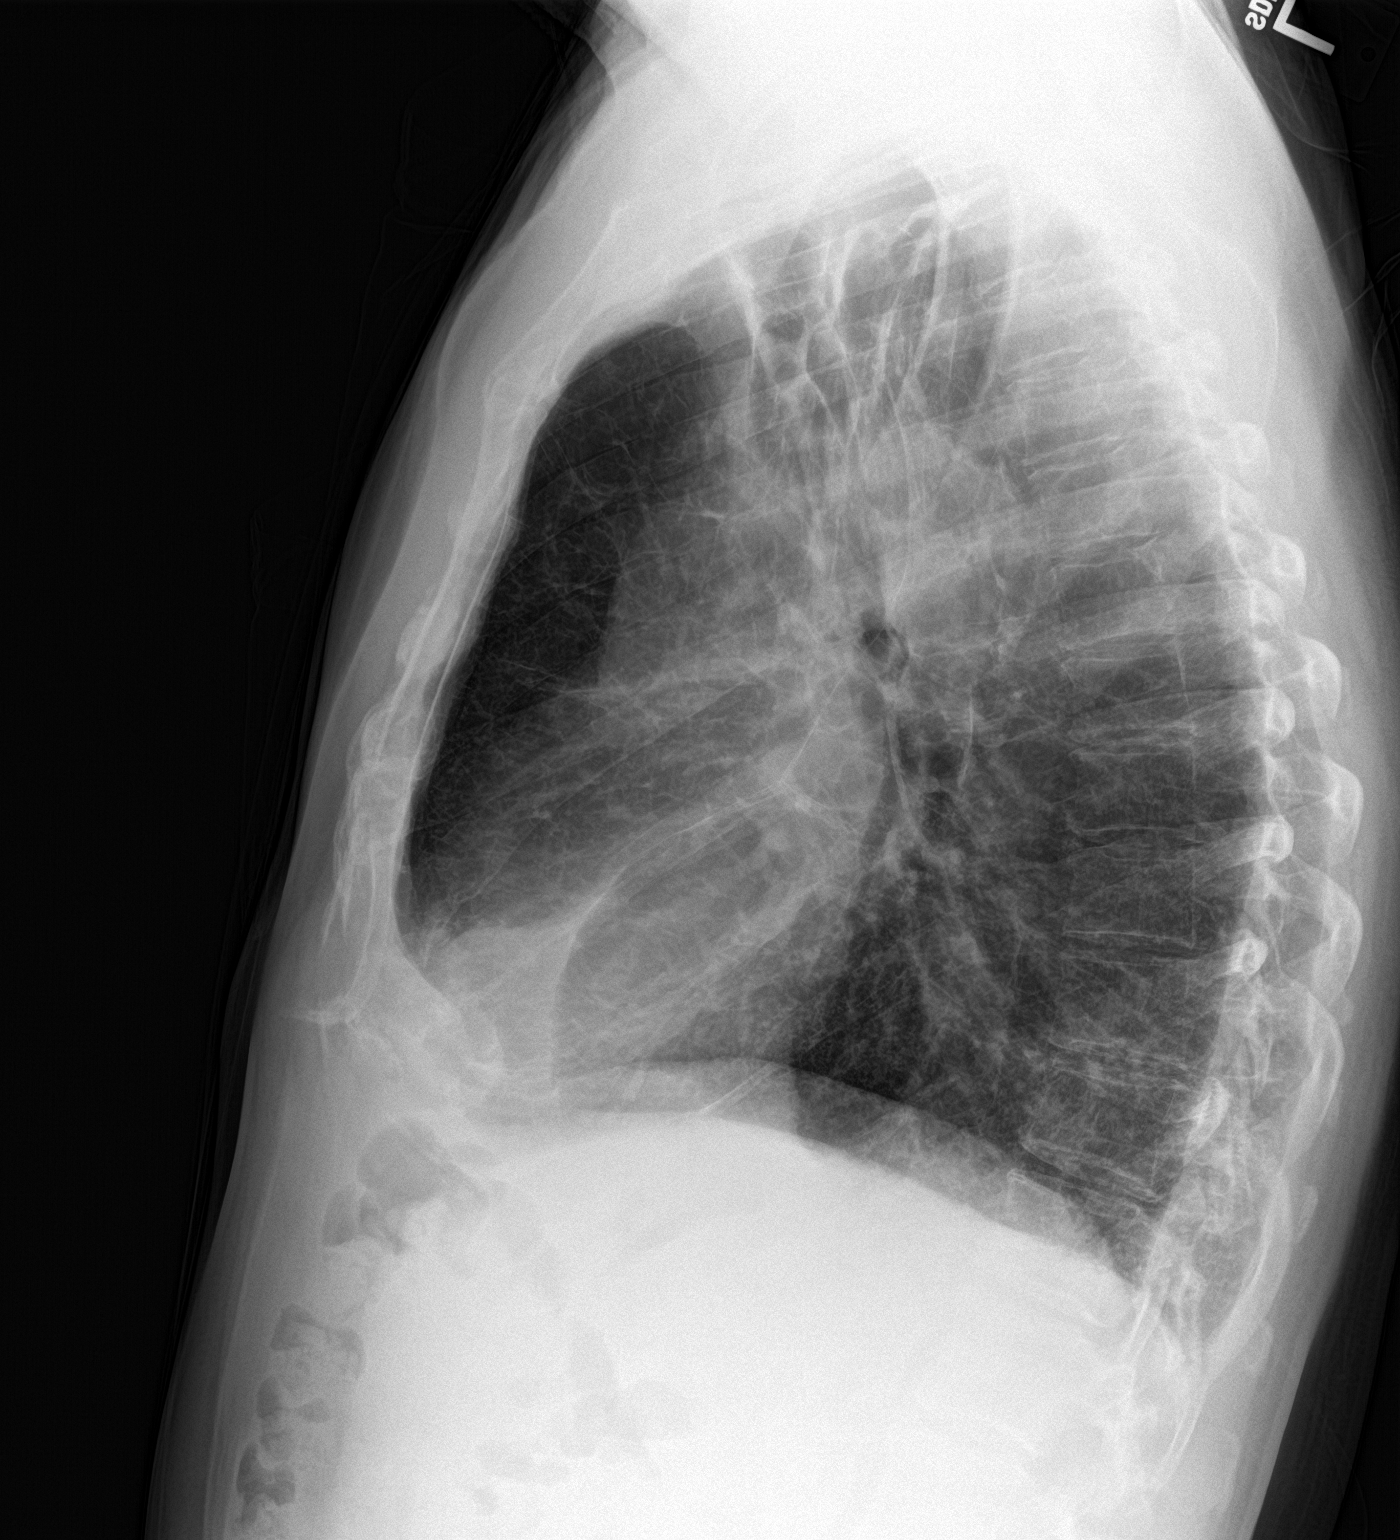

[chest pa]
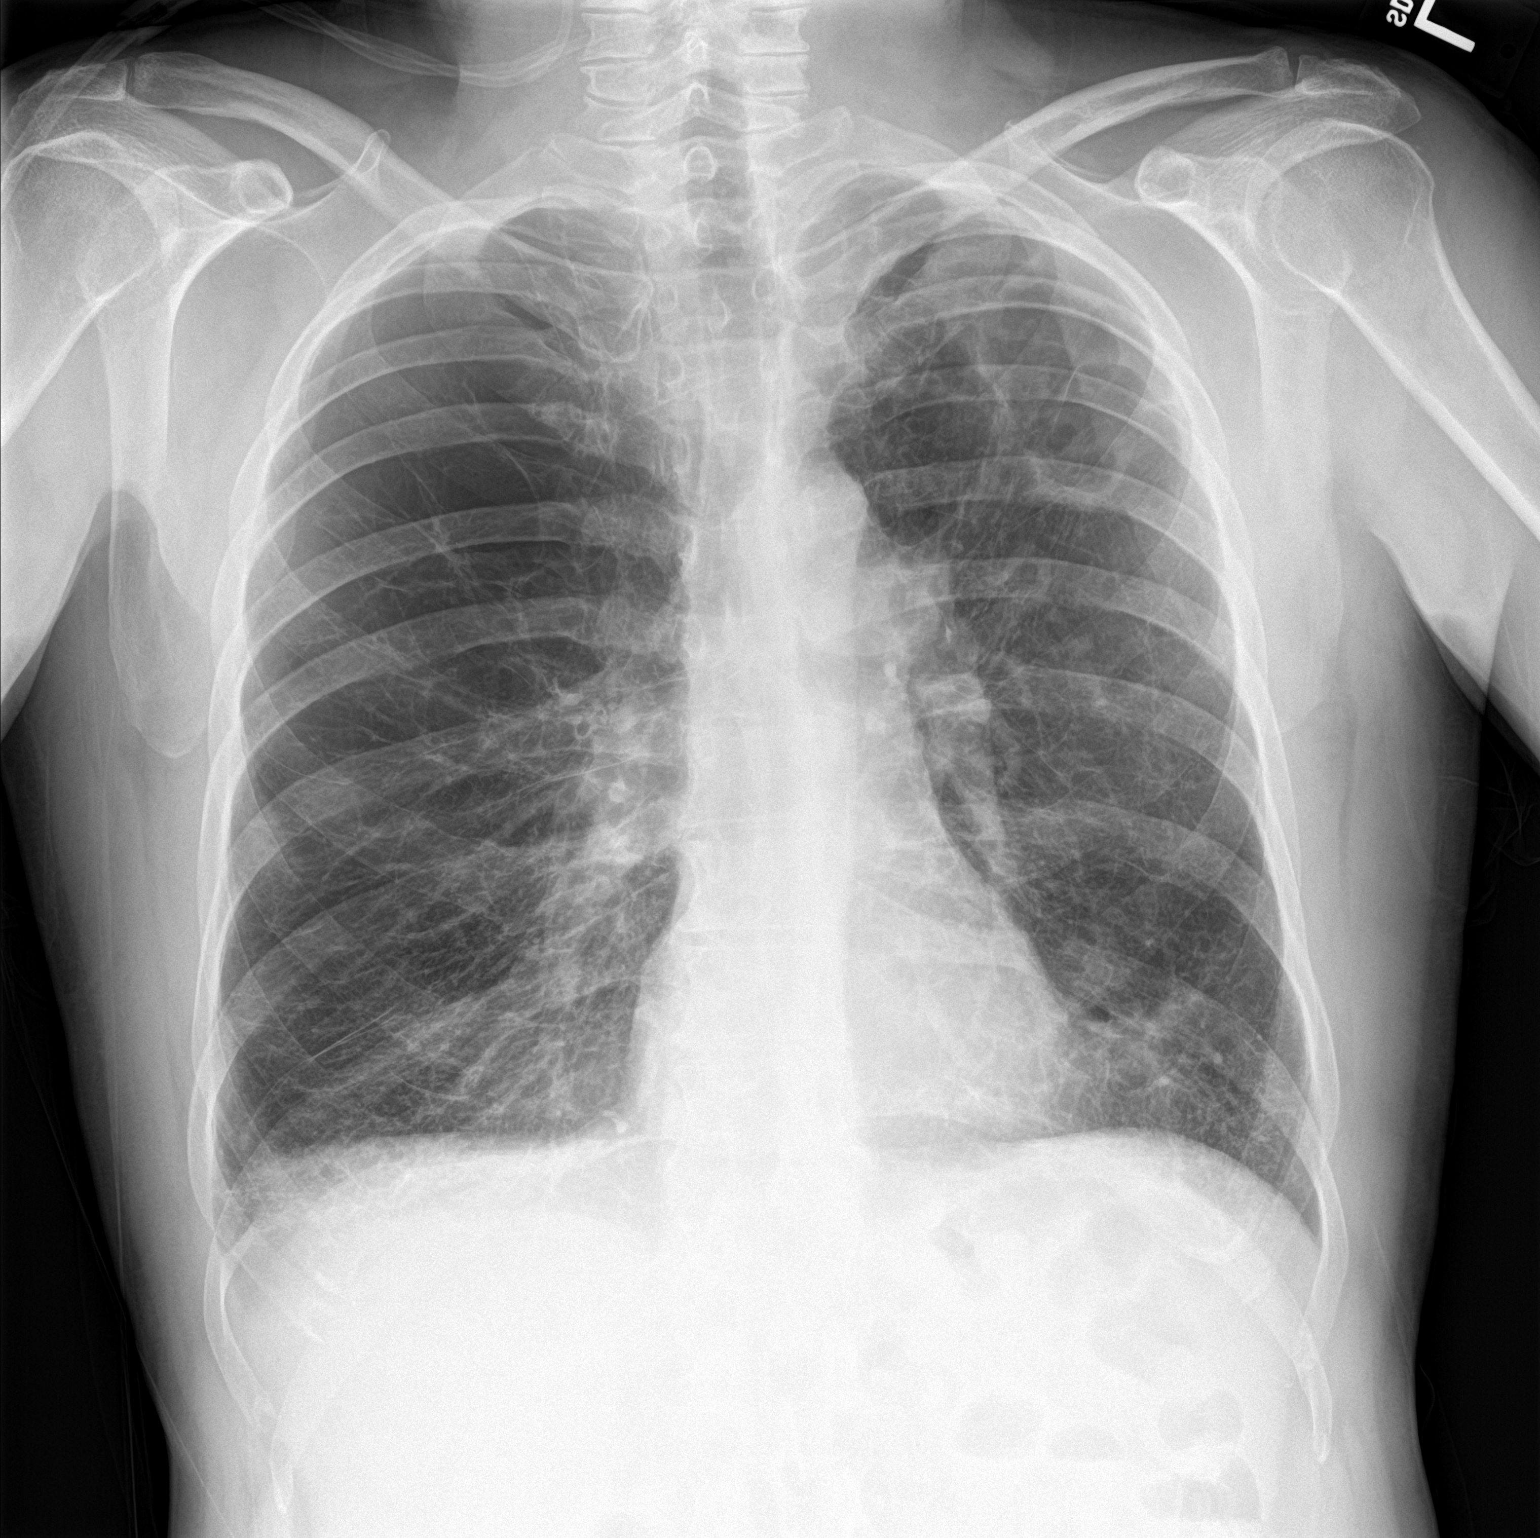

[2 of 2 positions shown; findings below may reference images not displayed]

FINDINGS: Again seen is severe emphysematous disease with basilar fibrosis.
Cavitary left upper lobe lesion is again identified. The appearance
is not grossly changed since the most recent plain films. No new
airspace disease. Heart size is normal. Atherosclerosis noted.
IMPRESSION: No marked change in a cavitary left upper lobe lesion.

Severe emphysema.

Basilar fibrosis.

## 2017-08-18 ENCOUNTER — Other Ambulatory Visit (HOSPITAL_COMMUNITY): Payer: Self-pay | Admitting: Pulmonary Disease

## 2017-08-18 DIAGNOSIS — J984 Other disorders of lung: Secondary | ICD-10-CM

## 2017-08-24 DIAGNOSIS — J439 Emphysema, unspecified: Secondary | ICD-10-CM | POA: Diagnosis not present

## 2017-08-24 DIAGNOSIS — J9610 Chronic respiratory failure, unspecified whether with hypoxia or hypercapnia: Secondary | ICD-10-CM | POA: Diagnosis not present

## 2017-08-24 DIAGNOSIS — J449 Chronic obstructive pulmonary disease, unspecified: Secondary | ICD-10-CM | POA: Diagnosis not present

## 2017-08-24 DIAGNOSIS — R0902 Hypoxemia: Secondary | ICD-10-CM | POA: Diagnosis not present

## 2017-08-27 ENCOUNTER — Ambulatory Visit (HOSPITAL_COMMUNITY): Payer: Medicare Other

## 2017-09-01 ENCOUNTER — Ambulatory Visit (HOSPITAL_COMMUNITY)
Admission: RE | Admit: 2017-09-01 | Discharge: 2017-09-01 | Disposition: A | Discharge status: Home / Self Care | Payer: Medicare Other | Source: Ambulatory Visit | Attending: Pulmonary Disease | Admitting: Pulmonary Disease

## 2017-09-01 DIAGNOSIS — H538 Other visual disturbances: Secondary | ICD-10-CM | POA: Diagnosis not present

## 2017-09-01 DIAGNOSIS — I2510 Atherosclerotic heart disease of native coronary artery without angina pectoris: Secondary | ICD-10-CM | POA: Diagnosis not present

## 2017-09-01 DIAGNOSIS — R0602 Shortness of breath: Secondary | ICD-10-CM | POA: Diagnosis not present

## 2017-09-01 DIAGNOSIS — Z961 Presence of intraocular lens: Secondary | ICD-10-CM | POA: Diagnosis not present

## 2017-09-01 DIAGNOSIS — R9389 Abnormal findings on diagnostic imaging of other specified body structures: Secondary | ICD-10-CM | POA: Diagnosis not present

## 2017-09-01 DIAGNOSIS — I700 Atherosclerosis of aorta: Secondary | ICD-10-CM | POA: Diagnosis not present

## 2017-09-01 DIAGNOSIS — K449 Diaphragmatic hernia without obstruction or gangrene: Secondary | ICD-10-CM | POA: Insufficient documentation

## 2017-09-01 DIAGNOSIS — J439 Emphysema, unspecified: Secondary | ICD-10-CM | POA: Diagnosis not present

## 2017-09-01 DIAGNOSIS — H53022 Refractive amblyopia, left eye: Secondary | ICD-10-CM | POA: Diagnosis not present

## 2017-09-01 DIAGNOSIS — R918 Other nonspecific abnormal finding of lung field: Secondary | ICD-10-CM | POA: Insufficient documentation

## 2017-09-01 DIAGNOSIS — J189 Pneumonia, unspecified organism: Secondary | ICD-10-CM | POA: Diagnosis not present

## 2017-09-01 DIAGNOSIS — J984 Other disorders of lung: Secondary | ICD-10-CM | POA: Diagnosis not present

## 2017-09-09 DIAGNOSIS — J449 Chronic obstructive pulmonary disease, unspecified: Secondary | ICD-10-CM | POA: Diagnosis not present

## 2017-09-09 DIAGNOSIS — J9611 Chronic respiratory failure with hypoxia: Secondary | ICD-10-CM | POA: Diagnosis not present

## 2017-09-09 DIAGNOSIS — A318 Other mycobacterial infections: Secondary | ICD-10-CM | POA: Diagnosis not present

## 2017-09-09 DIAGNOSIS — M545 Low back pain: Secondary | ICD-10-CM | POA: Diagnosis not present

## 2017-09-15 ENCOUNTER — Emergency Department (HOSPITAL_COMMUNITY): Payer: Medicare Other

## 2017-09-15 ENCOUNTER — Encounter (HOSPITAL_COMMUNITY): Payer: Self-pay | Admitting: Emergency Medicine

## 2017-09-15 ENCOUNTER — Other Ambulatory Visit: Payer: Self-pay

## 2017-09-15 ENCOUNTER — Inpatient Hospital Stay (HOSPITAL_COMMUNITY)
Admission: EM | Admit: 2017-09-15 | Discharge: 2017-09-23 | DRG: 193 | Disposition: A | Discharge status: Home Health | Payer: Medicare Other | Attending: Pulmonary Disease | Admitting: Pulmonary Disease

## 2017-09-15 DIAGNOSIS — J9621 Acute and chronic respiratory failure with hypoxia: Secondary | ICD-10-CM | POA: Diagnosis present

## 2017-09-15 DIAGNOSIS — A310 Pulmonary mycobacterial infection: Secondary | ICD-10-CM | POA: Diagnosis not present

## 2017-09-15 DIAGNOSIS — J9690 Respiratory failure, unspecified, unspecified whether with hypoxia or hypercapnia: Secondary | ICD-10-CM | POA: Diagnosis present

## 2017-09-15 DIAGNOSIS — J101 Influenza due to other identified influenza virus with other respiratory manifestations: Secondary | ICD-10-CM | POA: Diagnosis present

## 2017-09-15 DIAGNOSIS — Z9981 Dependence on supplemental oxygen: Secondary | ICD-10-CM

## 2017-09-15 DIAGNOSIS — J9601 Acute respiratory failure with hypoxia: Secondary | ICD-10-CM | POA: Diagnosis not present

## 2017-09-15 DIAGNOSIS — M542 Cervicalgia: Secondary | ICD-10-CM | POA: Diagnosis present

## 2017-09-15 DIAGNOSIS — J984 Other disorders of lung: Secondary | ICD-10-CM

## 2017-09-15 DIAGNOSIS — Z7984 Long term (current) use of oral hypoglycemic drugs: Secondary | ICD-10-CM | POA: Diagnosis not present

## 2017-09-15 DIAGNOSIS — I482 Chronic atrial fibrillation: Secondary | ICD-10-CM | POA: Diagnosis present

## 2017-09-15 DIAGNOSIS — E1142 Type 2 diabetes mellitus with diabetic polyneuropathy: Secondary | ICD-10-CM | POA: Diagnosis not present

## 2017-09-15 DIAGNOSIS — I480 Paroxysmal atrial fibrillation: Secondary | ICD-10-CM | POA: Diagnosis not present

## 2017-09-15 DIAGNOSIS — F419 Anxiety disorder, unspecified: Secondary | ICD-10-CM | POA: Diagnosis present

## 2017-09-15 DIAGNOSIS — G8929 Other chronic pain: Secondary | ICD-10-CM | POA: Diagnosis not present

## 2017-09-15 DIAGNOSIS — M545 Low back pain: Secondary | ICD-10-CM | POA: Diagnosis present

## 2017-09-15 DIAGNOSIS — J189 Pneumonia, unspecified organism: Secondary | ICD-10-CM | POA: Diagnosis not present

## 2017-09-15 DIAGNOSIS — E871 Hypo-osmolality and hyponatremia: Secondary | ICD-10-CM | POA: Diagnosis present

## 2017-09-15 DIAGNOSIS — J1000 Influenza due to other identified influenza virus with unspecified type of pneumonia: Secondary | ICD-10-CM | POA: Diagnosis not present

## 2017-09-15 DIAGNOSIS — R911 Solitary pulmonary nodule: Secondary | ICD-10-CM | POA: Diagnosis present

## 2017-09-15 DIAGNOSIS — J441 Chronic obstructive pulmonary disease with (acute) exacerbation: Secondary | ICD-10-CM | POA: Diagnosis present

## 2017-09-15 DIAGNOSIS — Z888 Allergy status to other drugs, medicaments and biological substances status: Secondary | ICD-10-CM

## 2017-09-15 DIAGNOSIS — K219 Gastro-esophageal reflux disease without esophagitis: Secondary | ICD-10-CM | POA: Diagnosis present

## 2017-09-15 DIAGNOSIS — R0602 Shortness of breath: Secondary | ICD-10-CM | POA: Diagnosis not present

## 2017-09-15 DIAGNOSIS — Z887 Allergy status to serum and vaccine status: Secondary | ICD-10-CM

## 2017-09-15 DIAGNOSIS — E1165 Type 2 diabetes mellitus with hyperglycemia: Secondary | ICD-10-CM | POA: Diagnosis present

## 2017-09-15 DIAGNOSIS — J9620 Acute and chronic respiratory failure, unspecified whether with hypoxia or hypercapnia: Secondary | ICD-10-CM | POA: Diagnosis present

## 2017-09-15 DIAGNOSIS — Z79899 Other long term (current) drug therapy: Secondary | ICD-10-CM | POA: Diagnosis not present

## 2017-09-15 DIAGNOSIS — R062 Wheezing: Secondary | ICD-10-CM | POA: Diagnosis not present

## 2017-09-15 DIAGNOSIS — I2510 Atherosclerotic heart disease of native coronary artery without angina pectoris: Secondary | ICD-10-CM | POA: Diagnosis not present

## 2017-09-15 DIAGNOSIS — J440 Chronic obstructive pulmonary disease with acute lower respiratory infection: Secondary | ICD-10-CM | POA: Diagnosis not present

## 2017-09-15 DIAGNOSIS — E119 Type 2 diabetes mellitus without complications: Secondary | ICD-10-CM

## 2017-09-15 DIAGNOSIS — J9611 Chronic respiratory failure with hypoxia: Secondary | ICD-10-CM | POA: Diagnosis present

## 2017-09-15 DIAGNOSIS — F17210 Nicotine dependence, cigarettes, uncomplicated: Secondary | ICD-10-CM | POA: Diagnosis present

## 2017-09-15 DIAGNOSIS — J449 Chronic obstructive pulmonary disease, unspecified: Secondary | ICD-10-CM | POA: Diagnosis present

## 2017-09-15 LAB — BASIC METABOLIC PANEL
ANION GAP: 15 (ref 5–15)
BUN: 14 mg/dL (ref 6–20)
CO2: 22 mmol/L (ref 22–32)
Calcium: 8.3 mg/dL — ABNORMAL LOW (ref 8.9–10.3)
Chloride: 94 mmol/L — ABNORMAL LOW (ref 101–111)
Creatinine, Ser: 1.03 mg/dL (ref 0.61–1.24)
Glucose, Bld: 223 mg/dL — ABNORMAL HIGH (ref 65–99)
POTASSIUM: 3.9 mmol/L (ref 3.5–5.1)
SODIUM: 131 mmol/L — AB (ref 135–145)

## 2017-09-15 LAB — CBC WITH DIFFERENTIAL/PLATELET
BASOS ABS: 0 10*3/uL (ref 0.0–0.1)
BASOS PCT: 0 %
EOS ABS: 0.7 10*3/uL (ref 0.0–0.7)
EOS PCT: 10 %
HCT: 37.6 % — ABNORMAL LOW (ref 39.0–52.0)
Hemoglobin: 12.3 g/dL — ABNORMAL LOW (ref 13.0–17.0)
LYMPHS PCT: 17 %
Lymphs Abs: 1.2 10*3/uL (ref 0.7–4.0)
MCH: 27.7 pg (ref 26.0–34.0)
MCHC: 32.7 g/dL (ref 30.0–36.0)
MCV: 84.7 fL (ref 78.0–100.0)
Monocytes Absolute: 0.9 10*3/uL (ref 0.1–1.0)
Monocytes Relative: 12 %
Neutro Abs: 4.4 10*3/uL (ref 1.7–7.7)
Neutrophils Relative %: 61 %
PLATELETS: 202 10*3/uL (ref 150–400)
RBC: 4.44 MIL/uL (ref 4.22–5.81)
RDW: 12.8 % (ref 11.5–15.5)
WBC: 7.2 10*3/uL (ref 4.0–10.5)

## 2017-09-15 LAB — GLUCOSE, CAPILLARY
GLUCOSE-CAPILLARY: 268 mg/dL — AB (ref 65–99)
Glucose-Capillary: 270 mg/dL — ABNORMAL HIGH (ref 65–99)
Glucose-Capillary: 93 mg/dL (ref 65–99)
Glucose-Capillary: 220 mg/dL — ABNORMAL HIGH (ref 65–99)

## 2017-09-15 LAB — PROCALCITONIN: PROCALCITONIN: 0.19 ng/mL

## 2017-09-15 MED ORDER — TIOTROPIUM BROMIDE MONOHYDRATE 18 MCG IN CAPS: 18.0000 ug | ORAL_CAPSULE | Freq: Every day | RESPIRATORY_TRACT | Status: DC

## 2017-09-15 MED ORDER — OXYCODONE-ACETAMINOPHEN 10-325 MG PO TABS: 1.0000 | ORAL_TABLET | ORAL | Status: DC | PRN

## 2017-09-15 MED ORDER — SODIUM CHLORIDE 0.9% FLUSH
3.0000 mL | INTRAVENOUS | Status: DC | PRN
  Administered 2017-09-16: 3 mL via INTRAVENOUS
  Filled 2017-09-15: qty 3

## 2017-09-15 MED ORDER — GABAPENTIN 100 MG PO CAPS
100.0000 mg | ORAL_CAPSULE | Freq: Every day | ORAL | Status: DC
  Administered 2017-09-15 – 2017-09-22 (×8): 100 mg via ORAL
  Filled 2017-09-15 (×8): qty 1

## 2017-09-15 MED ORDER — UMECLIDINIUM BROMIDE 62.5 MCG/INH IN AEPB
1.0000 | INHALATION_SPRAY | Freq: Every day | RESPIRATORY_TRACT | Status: DC
  Administered 2017-09-16 – 2017-09-23 (×8): 1 via RESPIRATORY_TRACT
  Filled 2017-09-15: qty 7

## 2017-09-15 MED ORDER — BACLOFEN 10 MG PO TABS
20.0000 mg | ORAL_TABLET | Freq: Three times a day (TID) | ORAL | Status: DC
  Administered 2017-09-15 – 2017-09-23 (×25): 20 mg via ORAL
  Filled 2017-09-15 (×24): qty 2

## 2017-09-15 MED ORDER — ONDANSETRON HCL 4 MG PO TABS
4.0000 mg | ORAL_TABLET | Freq: Four times a day (QID) | ORAL | Status: DC | PRN
  Administered 2017-09-18: 4 mg via ORAL
  Filled 2017-09-15: qty 1

## 2017-09-15 MED ORDER — OXYCODONE-ACETAMINOPHEN 5-325 MG PO TABS
1.0000 | ORAL_TABLET | ORAL | Status: DC | PRN
  Administered 2017-09-15 – 2017-09-18 (×12): 1 via ORAL
  Filled 2017-09-15 (×12): qty 1

## 2017-09-15 MED ORDER — PANTOPRAZOLE SODIUM 40 MG PO TBEC
40.0000 mg | DELAYED_RELEASE_TABLET | Freq: Every day | ORAL | Status: DC
  Administered 2017-09-15 – 2017-09-23 (×10): 40 mg via ORAL
  Filled 2017-09-15 (×10): qty 1

## 2017-09-15 MED ORDER — HYDROXYZINE HCL 25 MG PO TABS: 25.0000 mg | ORAL_TABLET | Freq: Four times a day (QID) | ORAL | Status: DC | PRN

## 2017-09-15 MED ORDER — HYDROXYZINE PAMOATE 25 MG PO CAPS
25.0000 mg | ORAL_CAPSULE | Freq: Four times a day (QID) | ORAL | Status: DC | PRN
  Filled 2017-09-15: qty 1

## 2017-09-15 MED ORDER — LEVALBUTEROL HCL 0.63 MG/3ML IN NEBU
INHALATION_SOLUTION | RESPIRATORY_TRACT | Status: AC
  Administered 2017-09-15: 0.63 mg via RESPIRATORY_TRACT
  Filled 2017-09-15: qty 3

## 2017-09-15 MED ORDER — AMOXICILLIN-POT CLAVULANATE 875-125 MG PO TABS
1.0000 | ORAL_TABLET | Freq: Two times a day (BID) | ORAL | Status: DC
  Administered 2017-09-15 – 2017-09-16 (×4): 1 via ORAL
  Filled 2017-09-15 (×4): qty 1

## 2017-09-15 MED ORDER — SODIUM CHLORIDE 0.9 % IV SOLN: 250.0000 mL | INTRAVENOUS | Status: DC | PRN

## 2017-09-15 MED ORDER — INSULIN ASPART 100 UNIT/ML ~~LOC~~ SOLN
0.0000 [IU] | Freq: Three times a day (TID) | SUBCUTANEOUS | Status: DC
  Administered 2017-09-15: 9 [IU] via SUBCUTANEOUS
  Administered 2017-09-15: 8 [IU] via SUBCUTANEOUS
  Administered 2017-09-16: 5 [IU] via SUBCUTANEOUS
  Administered 2017-09-16: 3 [IU] via SUBCUTANEOUS
  Administered 2017-09-16: 5 [IU] via SUBCUTANEOUS
  Administered 2017-09-17: 2 [IU] via SUBCUTANEOUS
  Administered 2017-09-17 (×2): 8 [IU] via SUBCUTANEOUS
  Administered 2017-09-18 (×2): 11 [IU] via SUBCUTANEOUS
  Administered 2017-09-19: 2 [IU] via SUBCUTANEOUS
  Administered 2017-09-19: 11 [IU] via SUBCUTANEOUS
  Administered 2017-09-19 – 2017-09-20 (×2): 15 [IU] via SUBCUTANEOUS
  Administered 2017-09-20: 3 [IU] via SUBCUTANEOUS
  Administered 2017-09-20 – 2017-09-21 (×3): 15 [IU] via SUBCUTANEOUS
  Administered 2017-09-21: 3 [IU] via SUBCUTANEOUS
  Administered 2017-09-22: 5 [IU] via SUBCUTANEOUS
  Administered 2017-09-22: 11 [IU] via SUBCUTANEOUS
  Administered 2017-09-23: 8 [IU] via SUBCUTANEOUS
  Administered 2017-09-23: 11 [IU] via SUBCUTANEOUS

## 2017-09-15 MED ORDER — TRIAMCINOLONE ACETONIDE 0.1 % EX CREA: 1.0000 "application " | TOPICAL_CREAM | Freq: Every day | CUTANEOUS | Status: DC | PRN

## 2017-09-15 MED ORDER — METFORMIN HCL 500 MG PO TABS
500.0000 mg | ORAL_TABLET | Freq: Every day | ORAL | Status: DC
  Administered 2017-09-15 – 2017-09-23 (×9): 500 mg via ORAL
  Filled 2017-09-15 (×9): qty 1

## 2017-09-15 MED ORDER — FLUTICASONE FUROATE-VILANTEROL 100-25 MCG/INH IN AEPB
1.0000 | INHALATION_SPRAY | Freq: Every day | RESPIRATORY_TRACT | Status: DC
  Administered 2017-09-16 – 2017-09-23 (×8): 1 via RESPIRATORY_TRACT
  Filled 2017-09-15: qty 28

## 2017-09-15 MED ORDER — PREDNISONE 50 MG PO TABS
60.0000 mg | ORAL_TABLET | Freq: Once | ORAL | Status: AC
  Administered 2017-09-15: 06:00:00 60 mg via ORAL
  Filled 2017-09-15: qty 1

## 2017-09-15 MED ORDER — MONTELUKAST SODIUM 10 MG PO TABS
10.0000 mg | ORAL_TABLET | Freq: Every day | ORAL | Status: DC
  Administered 2017-09-15 – 2017-09-22 (×8): 10 mg via ORAL
  Filled 2017-09-15 (×8): qty 1

## 2017-09-15 MED ORDER — PHENOL 1.4 % MT LIQD: 1.0000 | OROMUCOSAL | Status: DC | PRN

## 2017-09-15 MED ORDER — OXYCODONE HCL 5 MG PO TABS
5.0000 mg | ORAL_TABLET | ORAL | Status: DC | PRN
  Administered 2017-09-16 – 2017-09-18 (×8): 5 mg via ORAL
  Filled 2017-09-15 (×8): qty 1

## 2017-09-15 MED ORDER — PREDNISONE 20 MG PO TABS
60.0000 mg | ORAL_TABLET | Freq: Two times a day (BID) | ORAL | Status: DC
  Administered 2017-09-15 – 2017-09-16 (×2): 60 mg via ORAL
  Filled 2017-09-15 (×2): qty 3

## 2017-09-15 MED ORDER — FLUTICASONE PROPIONATE 50 MCG/ACT NA SUSP: 2.0000 | Freq: Every day | NASAL | Status: DC | PRN

## 2017-09-15 MED ORDER — BENZONATATE 100 MG PO CAPS
200.0000 mg | ORAL_CAPSULE | Freq: Three times a day (TID) | ORAL | Status: DC
  Administered 2017-09-15 – 2017-09-23 (×25): 200 mg via ORAL
  Filled 2017-09-15 (×25): qty 2

## 2017-09-15 MED ORDER — ONDANSETRON HCL 4 MG/2ML IJ SOLN: 4.0000 mg | Freq: Four times a day (QID) | INTRAMUSCULAR | Status: DC | PRN

## 2017-09-15 MED ORDER — INSULIN ASPART 100 UNIT/ML ~~LOC~~ SOLN
0.0000 [IU] | Freq: Every day | SUBCUTANEOUS | Status: DC
  Administered 2017-09-15: 2 [IU] via SUBCUTANEOUS
  Administered 2017-09-18: 4 [IU] via SUBCUTANEOUS
  Administered 2017-09-18: 5 [IU] via SUBCUTANEOUS
  Administered 2017-09-19 – 2017-09-21 (×3): 4 [IU] via SUBCUTANEOUS
  Administered 2017-09-22: 5 [IU] via SUBCUTANEOUS

## 2017-09-15 MED ORDER — IPRATROPIUM BROMIDE 0.02 % IN SOLN
0.5000 mg | Freq: Once | RESPIRATORY_TRACT | Status: AC
  Administered 2017-09-15: 0.5 mg via RESPIRATORY_TRACT
  Filled 2017-09-15: qty 2.5

## 2017-09-15 MED ORDER — RIFAMPIN 300 MG PO CAPS
600.0000 mg | ORAL_CAPSULE | Freq: Every day | ORAL | Status: DC
  Administered 2017-09-15 – 2017-09-18 (×4): 600 mg via ORAL
  Filled 2017-09-15 (×7): qty 2

## 2017-09-15 MED ORDER — ENOXAPARIN SODIUM 40 MG/0.4ML ~~LOC~~ SOLN
40.0000 mg | SUBCUTANEOUS | Status: DC
  Administered 2017-09-15 – 2017-09-23 (×9): 40 mg via SUBCUTANEOUS
  Filled 2017-09-15 (×9): qty 0.4

## 2017-09-15 MED ORDER — SODIUM CHLORIDE 0.9% FLUSH
3.0000 mL | Freq: Two times a day (BID) | INTRAVENOUS | Status: DC
  Administered 2017-09-15 – 2017-09-22 (×12): 3 mL via INTRAVENOUS

## 2017-09-15 MED ORDER — ACETAMINOPHEN 650 MG RE SUPP: 650.0000 mg | Freq: Four times a day (QID) | RECTAL | Status: DC | PRN

## 2017-09-15 MED ORDER — ACETAMINOPHEN 325 MG PO TABS: 650.0000 mg | ORAL_TABLET | Freq: Four times a day (QID) | ORAL | Status: DC | PRN

## 2017-09-15 MED ORDER — LEVALBUTEROL HCL 0.63 MG/3ML IN NEBU
0.6300 mg | INHALATION_SOLUTION | Freq: Four times a day (QID) | RESPIRATORY_TRACT | Status: DC
  Administered 2017-09-15 – 2017-09-23 (×33): 0.63 mg via RESPIRATORY_TRACT
  Filled 2017-09-15 (×33): qty 3

## 2017-09-15 MED ORDER — DILTIAZEM HCL ER COATED BEADS 120 MG PO CP24
120.0000 mg | ORAL_CAPSULE | Freq: Every day | ORAL | Status: DC
  Administered 2017-09-15 – 2017-09-23 (×9): 120 mg via ORAL
  Filled 2017-09-15 (×9): qty 1

## 2017-09-15 MED ORDER — HYDROCODONE-ACETAMINOPHEN 5-325 MG PO TABS
1.0000 | ORAL_TABLET | Freq: Once | ORAL | Status: AC
  Administered 2017-09-15: 1 via ORAL
  Filled 2017-09-15: qty 1

## 2017-09-15 MED ORDER — LORAZEPAM 0.5 MG PO TABS
0.5000 mg | ORAL_TABLET | Freq: Two times a day (BID) | ORAL | Status: DC | PRN
  Administered 2017-09-15 – 2017-09-22 (×7): 0.5 mg via ORAL
  Filled 2017-09-15 (×8): qty 1

## 2017-09-15 MED ORDER — LEVALBUTEROL HCL 0.63 MG/3ML IN NEBU
0.6300 mg | INHALATION_SOLUTION | Freq: Once | RESPIRATORY_TRACT | Status: AC
  Administered 2017-09-15: 0.63 mg via RESPIRATORY_TRACT
  Filled 2017-09-15: qty 3
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
## 2017-09-15 NOTE — H&P (Signed)
History and Physical    Ryan [name] [id number] DOB: 1960-11-18 DOA: 09/15/2017  PCP: [name], MD   Patient coming from: Home  Chief Complaint: Dyspnea  HPI: Ryan [name] is a 57 y.o. male with medical history significant for type 2 diabetes, chronic left lower lobe cavitary lesion, GERD, neuropathy, chronic neck and back pain, and COPD with chronic hypoxemia on 6 L of home oxygen who presented to the emergency department with complaints of shortness of breath that began to worsen at around 2 AM this morning.  He states that he was up at the time trying to take some medications.  He states that he has actually had some fever along with cough and some sputum production with possible hemoptysis over the last 3-4 days.  He has been recently started on Augmentin on 2/27 in addition to his rifampin for the cavitary lesion by his primary care provider Dr. Luan Pulling.  He noted on his pulse oximetry that his oxygen saturation fell to the 70th percentile for which she had to turn his nasal oxygen up to 10 L and he reports being bradycardic with a heart rate supposedly in the 30s. He denies any chest pain or lower extremity edema.   ED Course: Vital signs demonstrate heart rate of 101 with respiratory rate of 25 and he is currently on his baseline 6 L of nasal cannula saturating in the mid to high 90th percentile.  He appears to be in no significant respiratory distress.  Chest x-ray demonstrates stable left cavitary lung lesion finding with emphysematous changes and no signs of infiltrate.  His sodium is 131 with a glucose of 223.  He has been given some breathing treatments and is now being given oral prednisone and has had minimal relief noted thus far.  Review of Systems: All others reviewed and otherwise negative.  Past Medical History:  Diagnosis Date  . Atrial fibrillation ([place])    Not anticoagulated  . Barrett's esophagus   . Borderline diabetes   . Bronchitis   . CAP (community  acquired pneumonia) 07/10/2013   04/2015  . 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 Chronic back pain   . Chronic left shoulder pain   . Chronic neck pain   . Chronic respiratory failure ([place])   . Collagen vascular disease ([place])   . COPD (chronic obstructive pulmonary disease) ([place])   . Coronary atherosclerosis of native coronary artery    Mild nonobstructive disease at catheterization 2007  . DDD (degenerative disc disease)    Cervical and thoracic  . GERD (gastroesophageal reflux disease)   . History of pneumonia   . Hypercholesteremia   . Polycythemia   . Seizures ([place])    Last seizure 2 yrs ago  . Smoker   . Type 2 diabetes mellitus ([place])   . Type 2 diabetes mellitus without complication [facility])     Past Surgical History:  Procedure Laterality Date  . COLONOSCOPY  2012   Dr. Posey Pronto: normal  . ESOPHAGOGASTRODUODENOSCOPY N/A 05/04/2015   Dr. Michail Sermon: minimal erosive esophagitis, small hiatal hernia. FOOD PRECLUDED A COMPLETE EXAM. No biopsies taken  . ESOPHAGOGASTRODUODENOSCOPY  2011   [place]: Barrett's   . LUNG BIOPSY    . Throat biopsy    . VASECTOMY    . VASECTOMY  1987     reports that he has been smoking cigarettes.  He has a 22.50 pack-year smoking history. he has never used smokeless tobacco. He  reports that he does not drink alcohol or use drugs.  Allergies  Allergen Reactions  . Albuterol Palpitations  . Influenza Vaccine Live Swelling    Family History  Problem Relation Age of Onset  . Hypertension Mother   . Diabetes Mother   . Heart attack Mother   . Heart failure Mother   . Hypertension Sister   . Diabetes Sister   . Heart failure Sister   . Colon cancer Neg Hx     Prior to Admission medications   Medication Sig Start Date End Date Taking? Authorizing Provider  amoxicillin-clavulanate (AUGMENTIN) 875-125 MG tablet Take 1 tablet by mouth 2 (two) times daily. 09/09/17  Yes [provider]  albuterol  (VENTOLIN HFA) 108 (90 BASE) MCG/ACT inhaler Inhale 2 puffs into the lungs every 6 (six) hours as needed for wheezing or shortness of breath.    [provider]  baclofen (LIORESAL) 20 MG tablet Take 20 mg by mouth 3 (three) times daily.  06/05/15   [provider]  benzonatate (TESSALON) 200 MG capsule Take 200 mg by mouth 3 (three) times daily.    [provider]  CARTIA XT 120 MG 24 hr capsule Take 120 mg by mouth daily.  10/09/16   [provider]  fluticasone (FLONASE) 50 MCG/ACT nasal spray Place 2 sprays into both nostrils daily as needed for allergies.     [provider]  gabapentin (NEURONTIN) 100 MG capsule take 1 capsule by mouth at bedtime 11/03/16   Jamse Arn, MD  HYDROcodone-acetaminophen (NORCO/VICODIN) 5-325 MG tablet Take 1 tablet by mouth every 6 (six) hours as needed for severe pain. Patient not taking: Reported on 07/03/2017 12/26/16   Carmin Muskrat, MD  hydrOXYzine (VISTARIL) 25 MG capsule Take 25 mg by mouth every 6 (six) hours as needed for itching.  12/05/16   [provider]  levalbuterol Penne Lash) 0.63 MG/3ML nebulizer solution Take 3 mLs (0.63 mg total) by nebulization every 8 (eight) hours as needed for wheezing or shortness of breath. 09/05/15   [name], MD  LORazepam (ATIVAN) 0.5 MG tablet Take 1 tablet (0.5 mg total) by mouth 2 (two) times daily as needed for anxiety or sleep. Patient taking differently: Take 0.5 mg by mouth 4 (four) times daily as needed for anxiety or sleep.  05/04/15   Cristal Ford, DO  metFORMIN (GLUCOPHAGE) 500 MG tablet Take 1 tablet (500 mg total) by mouth daily with breakfast. 09/15/14   Rexene Alberts, MD  montelukast (SINGULAIR) 10 MG tablet Take 10 mg by mouth at bedtime.    [provider]  nitroGLYCERIN (NITROSTAT) 0.4 MG SL tablet Place 1 tablet (0.4 mg total) under the tongue every 5 (five) minutes as needed for chest pain. 04/22/13   Lendon Colonel, NP    omeprazole (PRILOSEC) 20 MG capsule Take 20 mg by mouth daily.    [provider]  oxyCODONE-acetaminophen (PERCOCET) 10-325 MG tablet Take 1 tablet by mouth every 4 (four) hours as needed for pain.    [provider]  predniSONE (DELTASONE) 50 MG tablet Take one tablet daily for 4 days 07/03/17   Evalee Jefferson, PA-C  rifampin (RIFADIN) 300 MG capsule Take 2 capsules (600 mg total) by mouth daily. 09/05/15   [name], MD  triamcinolone cream (KENALOG) 0.1 % Apply 1 application topically daily as needed (for itching/irritation).  09/29/16   [provider]    Physical Exam: Vitals:   09/15/17 1610 09/15/17 0421 09/15/17 0448  BP:  132/80   Pulse:  (!) 101   Resp:  (!) 25   Temp:  98.4 F (36.9 C)   TempSrc:  Oral   SpO2:  99% 94%  Weight: 63.5 kg (140 lb)    Height: 5\' 8"  (1.727 m)      Constitutional: NAD, calm, comfortable Vitals:   09/15/17 0416 09/15/17 0421 09/15/17 0448  BP:  132/80   Pulse:  (!) 101   Resp:  (!) 25   Temp:  98.4 F (36.9 C)   TempSrc:  Oral   SpO2:  99% 94%  Weight: 63.5 kg (140 lb)    Height: 5\' 8"  (1.727 m)     Eyes: lids and conjunctivae normal; legally blind ENMT: Mucous membranes are moist.  Neck: normal, supple Respiratory: clear to auscultation bilaterally with wheezing R>L. Normal respiratory effort. No accessory muscle use. On 6L [place]. Cardiovascular: Regular rate and rhythm, no murmurs. No extremity edema. Abdomen: no tenderness, no distention. Bowel sounds positive.  Musculoskeletal:  No joint deformity upper and lower extremities.   Skin: no rashes, lesions, ulcers.  Psychiatric: Normal judgment and insight. Alert and oriented x 3. Normal mood.   Labs on Admission: I have personally reviewed following labs and imaging studies  CBC: Recent Labs  Lab 09/15/17 0440  WBC 7.2  NEUTROABS 4.4  HGB 12.3*  HCT 37.6*  MCV 84.7  PLT 948   Basic Metabolic Panel: Recent Labs  Lab 09/15/17 0440  NA 131*   K 3.9  CL 94*  CO2 22  GLUCOSE 223*  BUN 14  CREATININE 1.03  CALCIUM 8.3*   GFR: Estimated Creatinine Clearance: 70.2 mL/min (by C-G formula based on SCr of 1.03 mg/dL). Liver Function Tests: No results for input(s): AST, ALT, ALKPHOS, BILITOT, PROT, ALBUMIN in the last 168 hours. No results for input(s): LIPASE, AMYLASE in the last 168 hours. No results for input(s): AMMONIA in the last 168 hours. Coagulation Profile: No results for input(s): INR, PROTIME in the last 168 hours. Cardiac Enzymes: No results for input(s): CKTOTAL, CKMB, CKMBINDEX, TROPONINI in the last 168 hours. BNP (last 3 results) No results for input(s): PROBNP in the last 8760 hours. HbA1C: No results for input(s): HGBA1C in the last 72 hours. CBG: No results for input(s): GLUCAP in the last 168 hours. Lipid Profile: No results for input(s): CHOL, HDL, LDLCALC, TRIG, CHOLHDL, LDLDIRECT in the last 72 hours. Thyroid Function Tests: No results for input(s): TSH, T4TOTAL, FREET4, T3FREE, THYROIDAB in the last 72 hours. Anemia Panel: No results for input(s): VITAMINB12, FOLATE, FERRITIN, TIBC, IRON, RETICCTPCT in the last 72 hours. Urine analysis:    Component Value Date/Time   COLORURINE ORANGE (A) 08/30/2015 0945   APPEARANCEUR CLEAR 08/30/2015 0945   LABSPEC 1.015 08/30/2015 0945   PHURINE 8.0 08/30/2015 0945   GLUCOSEU NEGATIVE 08/30/2015 0945   HGBUR SMALL (A) 08/30/2015 0945   BILIRUBINUR NEGATIVE 08/30/2015 0945   KETONESUR NEGATIVE 08/30/2015 0945   PROTEINUR 30 (A) 08/30/2015 0945   UROBILINOGEN 0.2 09/08/2014 1444   NITRITE NEGATIVE 08/30/2015 0945   LEUKOCYTESUR NEGATIVE 08/30/2015 0945    Radiological Exams on Admission: Dg Chest Port 1 View  Result Date: 09/15/2017 CLINICAL DATA:  Shortness of breath. EXAM: PORTABLE CHEST 1 VIEW COMPARISON:  Chest CT 09/01/2017 FINDINGS: Advanced emphysema and hyperinflation. Peripheral left lower lobe cavitary lesion with surrounding soft tissue  density, better assessed on prior MRI. The left upper lobe cavitary lesion on prior CT is only faintly visualized radiographically. Normal heart size.  Unchanged mediastinal contours. Chronic bronchial thickening. No pulmonary edema, pleural effusion or pneumothorax. IMPRESSION: 1. Advanced emphysema with cavitary left lung lesions, assessed on recent CT. 2. No evidence of acute abnormality. Electronically Signed   By: Jeb Levering M.D.   On: 09/15/2017 05:34    EKG: Independently reviewed. Sinus tach, 100bpm.  Assessment/Plan Principal Problem:   Acute respiratory failure with hypoxia (HCC) Active Problems:   Cavitary lesion of lung   Diabetes (HCC)   COPD exacerbation ([place])    1. Acute hypoxemic respiratory failure secondary to COPD exacerbation.  Continue with breathing treatments as well as oral prednisone that has already been given in ED.  Check pro-calcitonin and continue current home antibiotics.  Check respiratory panel.  Sputum cultures and sensitivities. 2. Type 2 diabetes with hyperglycemia.  Continue on home hypoglycemic regimen with SSI to be added on. 3. Hyponatremia mild likely pseudohyponatremia secondary to above.  Continue to monitor. 4. Chronic neck and low back pain with neuropathy.  Continue home medications as prescribed.   DVT prophylaxis: Lovenox Code Status: Full Family Communication: None Disposition Plan:Treatment of COPD Ex Consults called:None Admission status: Obs, med-surg   Caral Whan Darleen Crocker DO [facility] Pager [phone]  If 7PM-7AM, please contact night-coverage [email or web address] Password [facility]  09/15/2017, 6:22 AM

## 2017-09-15 NOTE — ED Triage Notes (Signed)
Pt c/o SOB and increased left lung pain x 1 wk, pt reports seeing PCP this week and was RX'ed another antibiotic; pt on 6L O2 at home

## 2017-09-16 DIAGNOSIS — J449 Chronic obstructive pulmonary disease, unspecified: Secondary | ICD-10-CM | POA: Diagnosis not present

## 2017-09-16 DIAGNOSIS — F419 Anxiety disorder, unspecified: Secondary | ICD-10-CM | POA: Diagnosis present

## 2017-09-16 DIAGNOSIS — J189 Pneumonia, unspecified organism: Secondary | ICD-10-CM | POA: Diagnosis present

## 2017-09-16 DIAGNOSIS — E871 Hypo-osmolality and hyponatremia: Secondary | ICD-10-CM | POA: Diagnosis present

## 2017-09-16 DIAGNOSIS — Z888 Allergy status to other drugs, medicaments and biological substances status: Secondary | ICD-10-CM | POA: Diagnosis not present

## 2017-09-16 DIAGNOSIS — Z79899 Other long term (current) drug therapy: Secondary | ICD-10-CM | POA: Diagnosis not present

## 2017-09-16 DIAGNOSIS — E1142 Type 2 diabetes mellitus with diabetic polyneuropathy: Secondary | ICD-10-CM | POA: Diagnosis present

## 2017-09-16 DIAGNOSIS — J9621 Acute and chronic respiratory failure with hypoxia: Secondary | ICD-10-CM | POA: Diagnosis present

## 2017-09-16 DIAGNOSIS — J441 Chronic obstructive pulmonary disease with (acute) exacerbation: Secondary | ICD-10-CM | POA: Diagnosis not present

## 2017-09-16 DIAGNOSIS — I2510 Atherosclerotic heart disease of native coronary artery without angina pectoris: Secondary | ICD-10-CM | POA: Diagnosis present

## 2017-09-16 DIAGNOSIS — R911 Solitary pulmonary nodule: Secondary | ICD-10-CM | POA: Diagnosis present

## 2017-09-16 DIAGNOSIS — J439 Emphysema, unspecified: Secondary | ICD-10-CM | POA: Diagnosis not present

## 2017-09-16 DIAGNOSIS — K219 Gastro-esophageal reflux disease without esophagitis: Secondary | ICD-10-CM | POA: Diagnosis present

## 2017-09-16 DIAGNOSIS — G8929 Other chronic pain: Secondary | ICD-10-CM | POA: Diagnosis present

## 2017-09-16 DIAGNOSIS — J440 Chronic obstructive pulmonary disease with acute lower respiratory infection: Secondary | ICD-10-CM | POA: Diagnosis present

## 2017-09-16 DIAGNOSIS — M545 Low back pain: Secondary | ICD-10-CM | POA: Diagnosis present

## 2017-09-16 DIAGNOSIS — R0902 Hypoxemia: Secondary | ICD-10-CM | POA: Diagnosis not present

## 2017-09-16 DIAGNOSIS — A310 Pulmonary mycobacterial infection: Secondary | ICD-10-CM | POA: Diagnosis present

## 2017-09-16 DIAGNOSIS — Z9981 Dependence on supplemental oxygen: Secondary | ICD-10-CM | POA: Diagnosis not present

## 2017-09-16 DIAGNOSIS — F17210 Nicotine dependence, cigarettes, uncomplicated: Secondary | ICD-10-CM | POA: Diagnosis present

## 2017-09-16 DIAGNOSIS — J9610 Chronic respiratory failure, unspecified whether with hypoxia or hypercapnia: Secondary | ICD-10-CM | POA: Diagnosis not present

## 2017-09-16 DIAGNOSIS — I482 Chronic atrial fibrillation: Secondary | ICD-10-CM | POA: Diagnosis present

## 2017-09-16 DIAGNOSIS — M542 Cervicalgia: Secondary | ICD-10-CM | POA: Diagnosis present

## 2017-09-16 DIAGNOSIS — E1165 Type 2 diabetes mellitus with hyperglycemia: Secondary | ICD-10-CM | POA: Diagnosis present

## 2017-09-16 DIAGNOSIS — Z887 Allergy status to serum and vaccine status: Secondary | ICD-10-CM | POA: Diagnosis not present

## 2017-09-16 DIAGNOSIS — I480 Paroxysmal atrial fibrillation: Secondary | ICD-10-CM | POA: Diagnosis present

## 2017-09-16 DIAGNOSIS — Z7984 Long term (current) use of oral hypoglycemic drugs: Secondary | ICD-10-CM | POA: Diagnosis not present

## 2017-09-16 DIAGNOSIS — J1000 Influenza due to other identified influenza virus with unspecified type of pneumonia: Secondary | ICD-10-CM | POA: Diagnosis present

## 2017-09-16 LAB — BASIC METABOLIC PANEL
ANION GAP: 12 (ref 5–15)
BUN: 17 mg/dL (ref 6–20)
CALCIUM: 8.3 mg/dL — AB (ref 8.9–10.3)
CO2: 28 mmol/L (ref 22–32)
CREATININE: 0.98 mg/dL (ref 0.61–1.24)
Chloride: 93 mmol/L — ABNORMAL LOW (ref 101–111)
Glucose, Bld: 188 mg/dL — ABNORMAL HIGH (ref 65–99)
Potassium: 4.1 mmol/L (ref 3.5–5.1)
SODIUM: 133 mmol/L — AB (ref 135–145)

## 2017-09-16 LAB — GLUCOSE, CAPILLARY
GLUCOSE-CAPILLARY: 201 mg/dL — AB (ref 65–99)
GLUCOSE-CAPILLARY: 175 mg/dL — AB (ref 65–99)
Glucose-Capillary: 135 mg/dL — ABNORMAL HIGH (ref 65–99)
Glucose-Capillary: 216 mg/dL — ABNORMAL HIGH (ref 65–99)

## 2017-09-16 LAB — EXPECTORATED SPUTUM ASSESSMENT W GRAM STAIN, RFLX TO RESP C

## 2017-09-16 LAB — RESPIRATORY PANEL BY PCR
ADENOVIRUS-RVPPCR: NOT DETECTED
BORDETELLA PERTUSSIS-RVPCR: NOT DETECTED
CHLAMYDOPHILA PNEUMONIAE-RVPPCR: NOT DETECTED
Coronavirus 229E: NOT DETECTED
Coronavirus HKU1: NOT DETECTED
Coronavirus NL63: NOT DETECTED
Coronavirus OC43: NOT DETECTED
INFLUENZA B-RVPPCR: DETECTED — AB
Influenza A: NOT DETECTED
MYCOPLASMA PNEUMONIAE-RVPPCR: NOT DETECTED
Metapneumovirus: NOT DETECTED
PARAINFLUENZA VIRUS 4-RVPPCR: NOT DETECTED
Parainfluenza Virus 1: NOT DETECTED
Parainfluenza Virus 2: NOT DETECTED
Parainfluenza Virus 3: NOT DETECTED
RESPIRATORY SYNCYTIAL VIRUS-RVPPCR: NOT DETECTED
Rhinovirus / Enterovirus: NOT DETECTED

## 2017-09-16 LAB — HIV ANTIBODY (ROUTINE TESTING W REFLEX): HIV SCREEN 4TH GENERATION: NONREACTIVE

## 2017-09-16 LAB — EXPECTORATED SPUTUM ASSESSMENT W REFEX TO RESP CULTURE

## 2017-09-16 MED ORDER — METHYLPREDNISOLONE SODIUM SUCC 125 MG IJ SOLR
125.0000 mg | Freq: Three times a day (TID) | INTRAMUSCULAR | Status: DC
  Administered 2017-09-16 – 2017-09-20 (×12): 125 mg via INTRAVENOUS
  Filled 2017-09-16 (×12): qty 2

## 2017-09-16 MED ORDER — OSELTAMIVIR PHOSPHATE 75 MG PO CAPS
75.0000 mg | ORAL_CAPSULE | Freq: Two times a day (BID) | ORAL | Status: AC
  Administered 2017-09-16 – 2017-09-20 (×10): 75 mg via ORAL
  Filled 2017-09-16 (×10): qty 1

## 2017-09-16 NOTE — Progress Notes (Signed)
Pt. Anxious. Working at breathing. Use of accesory muscles . Will DC P.O. Prednisone and order Solumedrol 125 IV Q8H. Continue Rifampin I& Augmentin 875-125 BID. Pt smoked until 2 days ago. Ryan [name] [id number] DOB: 1961-07-08 DOA: 09/15/2017 PCP: Ryan Du, MD   Physical Exam: Blood pressure 112/74, pulse 82, temperature 97.8 F (36.6 C), temperature source Oral, resp. rate 20, height 5\' 8"  (1.727 [name]), weight 63.5 kg (140 lb), SpO2 98 %.use of accesory muscles noted . inspiratoyry / expiratory wheeze noted. No rales.  Positive ronchi bilaterally. Heart- Reg Rhythm . No S3, S4. No H, T, or R.   Investigations:  Recent Results (from the past 240 hour(s))  Respiratory Panel by PCR     Status: Abnormal   Collection Time: 09/15/17 10:15 AM  Result Value Ref Range Status   Adenovirus NOT DETECTED NOT DETECTED Final   Coronavirus 229E NOT DETECTED NOT DETECTED Final   Coronavirus HKU1 NOT DETECTED NOT DETECTED Final   Coronavirus NL63 NOT DETECTED NOT DETECTED Final   Coronavirus OC43 NOT DETECTED NOT DETECTED Final   Metapneumovirus NOT DETECTED NOT DETECTED Final   Rhinovirus / Enterovirus NOT DETECTED NOT DETECTED Final   Influenza A NOT DETECTED NOT DETECTED Final   Influenza B DETECTED (A) NOT DETECTED Final   Parainfluenza Virus 1 NOT DETECTED NOT DETECTED Final   Parainfluenza Virus 2 NOT DETECTED NOT DETECTED Final   Parainfluenza Virus 3 NOT DETECTED NOT DETECTED Final   Parainfluenza Virus 4 NOT DETECTED NOT DETECTED Final   Respiratory Syncytial Virus NOT DETECTED NOT DETECTED Final   Bordetella pertussis NOT DETECTED NOT DETECTED Final   Chlamydophila pneumoniae NOT DETECTED NOT DETECTED Final   Mycoplasma pneumoniae NOT DETECTED NOT DETECTED Final    Comment: Performed at [facility], [place]. [address], [place], [place] [address]  Culture, sputum-assessment     Status: None   Collection Time: 09/15/17  5:48 PM  Result Value Ref Range Status   Specimen  Description EXPECTORATED SPUTUM  Final   Special Requests NONE  Final   Sputum evaluation   Final    THIS SPECIMEN IS ACCEPTABLE FOR SPUTUM CULTURE Performed at [facility], [address], [place], [place] [address]    Report Status 09/16/2017 FINAL  Final     Basic Metabolic Panel: Recent Labs    09/15/17 0440 09/16/17 0507  NA 131* 133*  K 3.9 4.1  CL 94* 93*  CO2 22 28  GLUCOSE 223* 188*  BUN 14 17  CREATININE 1.03 0.98  CALCIUM 8.3* 8.3*   Liver Function Tests: No results for input(s): AST, ALT, ALKPHOS, BILITOT, PROT, ALBUMIN in the last 72 hours.   CBC: Recent Labs    09/15/17 0440  WBC 7.2  NEUTROABS 4.4  HGB 12.3*  HCT 37.6*  MCV 84.7  PLT 202    Dg Chest Port 1 View  Result Date: 09/15/2017 CLINICAL DATA:  Shortness of breath. EXAM: PORTABLE CHEST 1 VIEW COMPARISON:  Chest CT 09/01/2017 FINDINGS: Advanced emphysema and hyperinflation. Peripheral left lower lobe cavitary lesion with surrounding soft tissue density, better assessed on prior MRI. The left upper lobe cavitary lesion on prior CT is only faintly visualized radiographically. Normal heart size. Unchanged mediastinal contours. Chronic bronchial thickening. No pulmonary edema, pleural effusion or pneumothorax. IMPRESSION: 1. Advanced emphysema with cavitary left lung lesions, assessed on recent CT. 2. No evidence of acute abnormality. Electronically Signed   By: Jeb Levering [name]   On: 09/15/2017 05:34  Medications:   Impression:  Principal Problem:   Acute respiratory failure with hypoxia (HCC) Active Problems:   Cavitary lesion of lung   Diabetes (HCC)   COPD exacerbation (HCC)   Respiratory failure ([place])     Plan: solumedrol 125 IV Q8H. Cbc & bmet in AM   Consultants:    Procedures   Antibiotics: Rifampin and Augmentin 875-125 bid          Time spent:30 min.   LOS: 0 days   Ryan [name]   09/16/2017, 12:57 PM

## 2017-09-16 NOTE — Plan of Care (Signed)
  Education: Knowledge of General Education information will improve 09/16/2017 2335 - Progressing by Sherlynn Stalls, RN   Health Behavior/Discharge Planning: Ability to manage health-related needs will improve 09/16/2017 2335 - Progressing by Sherlynn Stalls, RN   Clinical Measurements: Ability to maintain clinical measurements within normal limits will improve 09/16/2017 2335 - Progressing by Sherlynn Stalls, RN Will remain free from infection 09/16/2017 2335 - Progressing by Sherlynn Stalls, RN Diagnostic test results will improve 09/16/2017 2335 - Progressing by Sherlynn Stalls, RN Respiratory complications will improve 09/16/2017 2335 - Progressing by Sherlynn Stalls, RN

## 2017-09-16 NOTE — Progress Notes (Signed)
Pharmacy Antibiotic Note  Ryan [name] is a 57 y.o. male admitted on 09/15/2017 with influenza B detected.  Pharmacy has been consulted for TAMIFLU dosing.  Plan: Tamiflu 75mg  po BID X 5 days Monitor progress  Height: 5\' 8"  (172.7 cm) Weight: 140 lb (63.5 kg) IBW/kg (Calculated) : 68.4  Temp (24hrs), Avg:97.4 F (36.3 C), Min:95.5 F (35.3 C), Max:98.4 F (36.9 C)  Recent Labs  Lab 09/15/17 0440 09/16/17 0507  WBC 7.2  --   CREATININE 1.03 0.98    Estimated Creatinine Clearance: 73.8 mL/min (by C-G formula based on SCr of 0.98 mg/dL).    Allergies  Allergen Reactions  . Albuterol Palpitations  . 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 Influenza Vaccine Live Swelling   Recent Results (from the past 240 hour(s))  Respiratory Panel by PCR     Status: Abnormal   Collection Time: 09/15/17 10:15 AM  Result Value Ref Range Status   Adenovirus NOT DETECTED NOT DETECTED Final   Coronavirus 229E NOT DETECTED NOT DETECTED Final   Coronavirus HKU1 NOT DETECTED NOT DETECTED Final   Coronavirus NL63 NOT DETECTED NOT DETECTED Final   Coronavirus OC43 NOT DETECTED NOT DETECTED Final   Metapneumovirus NOT DETECTED NOT DETECTED Final   Rhinovirus / Enterovirus NOT DETECTED NOT DETECTED Final   Influenza A NOT DETECTED NOT DETECTED Final   Influenza B DETECTED (A) NOT DETECTED Final   Parainfluenza Virus 1 NOT DETECTED NOT DETECTED Final   Parainfluenza Virus 2 NOT DETECTED NOT DETECTED Final   Parainfluenza Virus 3 NOT DETECTED NOT DETECTED Final   Parainfluenza Virus 4 NOT DETECTED NOT DETECTED Final   Respiratory Syncytial Virus NOT DETECTED NOT DETECTED Final   Bordetella pertussis NOT DETECTED NOT DETECTED Final   Chlamydophila pneumoniae NOT DETECTED NOT DETECTED Final   Mycoplasma pneumoniae NOT DETECTED NOT DETECTED Final    Comment: Performed at [facility], [address] [place], [place] [address]   Culture, sputum-assessment     Status: None   Collection Time: 09/15/17  5:48 PM  Result Value Ref Range Status   Specimen Description EXPECTORATED SPUTUM  Final   Special Requests NONE  Final   Sputum evaluation   Final    THIS SPECIMEN IS ACCEPTABLE FOR SPUTUM CULTURE Performed at [facility], [address] [place], [place] [address]    Report Status 09/16/2017 FINAL  Final   Thank you for allowing pharmacy to be a part of this patient's care.  Hart Robinsons A 09/16/2017 8:07 AM

## 2017-09-17 LAB — GLUCOSE, CAPILLARY
GLUCOSE-CAPILLARY: 261 mg/dL — AB (ref 65–99)
GLUCOSE-CAPILLARY: 286 mg/dL — AB (ref 65–99)
GLUCOSE-CAPILLARY: 123 mg/dL — AB (ref 65–99)

## 2017-09-17 LAB — BASIC METABOLIC PANEL
Anion gap: 11 (ref 5–15)
BUN: 16 mg/dL (ref 6–20)
CHLORIDE: 93 mmol/L — AB (ref 101–111)
CO2: 27 mmol/L (ref 22–32)
Calcium: 8.2 mg/dL — ABNORMAL LOW (ref 8.9–10.3)
Creatinine, Ser: 0.91 mg/dL (ref 0.61–1.24)
GFR calc non Af Amer: >60 mL/min (ref >60)
Glucose, Bld: 331 mg/dL — ABNORMAL HIGH (ref 65–99)
POTASSIUM: 4.4 mmol/L (ref 3.5–5.1)
SODIUM: 131 mmol/L — AB (ref 135–145)

## 2017-09-17 LAB — CBC WITH DIFFERENTIAL/PLATELET
Basophils Absolute: 0 10*3/uL (ref 0.0–0.1)
Basophils Relative: 0 %
Eosinophils Absolute: 0.1 10*3/uL (ref 0.0–0.7)
Eosinophils Relative: 2 %
HEMATOCRIT: 33 % — AB (ref 39.0–52.0)
HEMOGLOBIN: 10.6 g/dL — AB (ref 13.0–17.0)
LYMPHS ABS: 0.9 10*3/uL (ref 0.7–4.0)
LYMPHS PCT: 22 %
MCH: 27.5 pg (ref 26.0–34.0)
MCHC: 32.1 g/dL (ref 30.0–36.0)
MCV: 85.5 fL (ref 78.0–100.0)
Monocytes Absolute: 0.3 10*3/uL (ref 0.1–1.0)
Monocytes Relative: 6 %
NEUTROS ABS: 2.8 10*3/uL (ref 1.7–7.7)
NEUTROS PCT: 70 %
Platelets: 163 10*3/uL (ref 150–400)
RBC: 3.86 MIL/uL — AB (ref 4.22–5.81)
RDW: 12.9 % (ref 11.5–15.5)
WBC: 4 10*3/uL (ref 4.0–10.5)

## 2017-09-17 MED ORDER — HYDROCODONE-HOMATROPINE 5-1.5 MG/5ML PO SYRP
5.0000 mL | ORAL_SOLUTION | ORAL | Status: DC | PRN
  Administered 2017-09-17 – 2017-09-22 (×4): 5 mL via ORAL
  Filled 2017-09-17 (×4): qty 5

## 2017-09-17 MED ORDER — PIPERACILLIN-TAZOBACTAM 3.375 G IVPB
3.3750 g | Freq: Three times a day (TID) | INTRAVENOUS | Status: DC
  Administered 2017-09-17 – 2017-09-22 (×16): 3.375 g via INTRAVENOUS
  Filled 2017-09-17 (×16): qty 50

## 2017-09-17 MED ORDER — VANCOMYCIN HCL 10 G IV SOLR
1250.0000 mg | Freq: Once | INTRAVENOUS | Status: AC
  Administered 2017-09-17: 1250 mg via INTRAVENOUS
  Filled 2017-09-17: qty 1250

## 2017-09-17 MED ORDER — VANCOMYCIN HCL IN DEXTROSE 750-5 MG/150ML-% IV SOLN
750.0000 mg | Freq: Two times a day (BID) | INTRAVENOUS | Status: DC
  Administered 2017-09-17 – 2017-09-19 (×5): 750 mg via INTRAVENOUS
  Filled 2017-09-17 (×8): qty 150

## 2017-09-17 NOTE — Progress Notes (Signed)
Subjective: He says he does not feel well.  He was having significant cough yesterday and had what sounds like near syncope from coughing.  He is coughing up sputum but he cannot see it very well.  He is still short of breath.  He complains of back pain.  Objective: Vital signs in last 24 hours: Temp:  [97.7 F (36.5 C)-98.5 F (36.9 C)] 97.7 F (36.5 C) (03/07 0546) Pulse Rate:  [84-92] 84 (03/07 0546) Resp:  [18-20] 18 (03/07 0546) BP: (114-120)/(54-77) 114/54 (03/07 0546) SpO2:  [97 %-100 %] 97 % (03/07 0755) Weight change:  Last BM Date: 09/14/17  Intake/Output from previous day: 03/06 0701 - 03/07 0700 In: 723 [P.O.:720; I.V.:3] Out: -   PHYSICAL EXAM General appearance: alert, cooperative and mild distress Resp: rhonchi bilaterally Cardio: irregularly irregular rhythm GI: soft, non-tender; bowel sounds normal; no masses,  no organomegaly Extremities: extremities normal, atraumatic, no cyanosis or edema Very poor vision  Lab Results:  Results for orders placed or performed during the hospital encounter of 09/15/17 (from the past 48 hour(s))  Respiratory Panel by PCR     Status: Abnormal   Collection Time: 09/15/17 10:15 AM  Result Value Ref Range   Adenovirus NOT DETECTED NOT DETECTED   Coronavirus 229E NOT DETECTED NOT DETECTED   Coronavirus HKU1 NOT DETECTED NOT DETECTED   Coronavirus NL63 NOT DETECTED NOT DETECTED   Coronavirus OC43 NOT DETECTED NOT DETECTED   Metapneumovirus NOT DETECTED NOT DETECTED   Rhinovirus / Enterovirus NOT DETECTED NOT DETECTED   Influenza A NOT DETECTED NOT DETECTED   Influenza B DETECTED (A) NOT DETECTED   Parainfluenza Virus 1 NOT DETECTED NOT DETECTED   Parainfluenza Virus 2 NOT DETECTED NOT DETECTED   Parainfluenza Virus 3 NOT DETECTED NOT DETECTED   Parainfluenza Virus 4 NOT DETECTED NOT DETECTED   Respiratory Syncytial Virus NOT DETECTED NOT DETECTED   Bordetella pertussis NOT DETECTED NOT DETECTED   Chlamydophila pneumoniae  NOT DETECTED NOT DETECTED   Mycoplasma pneumoniae NOT DETECTED NOT DETECTED    Comment: Performed at [facility], [address] [place], [place] [address]  Glucose, capillary     Status: Abnormal   Collection Time: 09/15/17 11:21 AM  Result Value Ref Range   Glucose-Capillary 268 (H) 65 - 99 mg/dL   Comment 1 Notify RN    Comment 2 Document in Chart   Glucose, capillary     Status: None   Collection Time: 09/15/17  4:35 PM  Result Value Ref Range   Glucose-Capillary 93 65 - 99 mg/dL   Comment 1 Notify RN    Comment 2 Document in Chart   Culture, sputum-assessment     Status: None   Collection Time: 09/15/17  5:48 PM  Result Value Ref Range   Specimen Description EXPECTORATED SPUTUM    Special Requests NONE    Sputum evaluation      THIS SPECIMEN IS ACCEPTABLE FOR SPUTUM CULTURE Performed at [facility], [address] [place], [place] [address]    Report Status 09/16/2017 FINAL   Culture, respiratory (NON-Expectorated)     Status: None (Preliminary result)   Collection Time: 09/15/17  5:48 PM  Result Value Ref Range   Specimen Description      EXPECTORATED SPUTUM Performed at [facility], [address], [place], [place] [address]    Special Requests      NONE Reflexed from [id number] Performed at [facility], [address], [place], [place] [address]  Gram Stain      RARE WBC PRESENT, PREDOMINANTLY PMN FEW YEAST Performed at [place] [address], [place], [place] [address]    Culture PENDING    Report Status PENDING   Glucose, capillary     Status: Abnormal   Collection Time: 09/15/17  9:08 PM  Result Value Ref Range   Glucose-Capillary 220 (H) 65 - 99 mg/dL   Comment 1 Notify RN    Comment 2 Document in Chart   Basic metabolic panel     Status: Abnormal   Collection Time: 09/16/17  5:07 AM  Result Value Ref Range   Sodium 133 (L) 135 - 145 mmol/L   Potassium 4.1 3.5 - 5.1 mmol/L   Chloride 93 (L) 101 - 111 mmol/L   CO2 28 22 - 32 mmol/L    Glucose, Bld 188 (H) 65 - 99 mg/dL   BUN 17 6 - 20 mg/dL   Creatinine, Ser 0.98 0.61 - 1.24 mg/dL   Calcium 8.3 (L) 8.9 - 10.3 mg/dL   GFR calc non Af Amer >60 >60 mL/min   GFR calc Af Amer >60 >60 mL/min    Comment: (NOTE) The eGFR has been calculated using the CKD EPI equation. This calculation has not been validated in all clinical situations. eGFR's persistently <60 mL/min signify possible Chronic Kidney Disease.    Anion gap 12 5 - 15    Comment: Performed at [facility], [address], [place], [place] [address]  Glucose, capillary     Status: Abnormal   Collection Time: 09/16/17  7:58 AM  Result Value Ref Range   Glucose-Capillary 201 (H) 65 - 99 mg/dL   Comment 1 Notify RN    Comment 2 Document in Chart   Glucose, capillary     Status: Abnormal   Collection Time: 09/16/17 11:35 AM  Result Value Ref Range   Glucose-Capillary 135 (H) 65 - 99 mg/dL   Comment 1 Notify RN    Comment 2 Document in Chart   Glucose, capillary     Status: Abnormal   Collection Time: 09/16/17  4:19 PM  Result Value Ref Range   Glucose-Capillary 216 (H) 65 - 99 mg/dL   Comment 1 Notify RN    Comment 2 Document in Chart   Glucose, capillary     Status: Abnormal   Collection Time: 09/16/17  9:50 PM  Result Value Ref Range   Glucose-Capillary 175 (H) 65 - 99 mg/dL  Basic metabolic panel     Status: Abnormal   Collection Time: 09/17/17  4:15 AM  Result Value Ref Range   Sodium 131 (L) 135 - 145 mmol/L   Potassium 4.4 3.5 - 5.1 mmol/L   Chloride 93 (L) 101 - 111 mmol/L   CO2 27 22 - 32 mmol/L   Glucose, Bld 331 (H) 65 - 99 mg/dL   BUN 16 6 - 20 mg/dL   Creatinine, Ser 0.91 0.61 - 1.24 mg/dL   Calcium 8.2 (L) 8.9 - 10.3 mg/dL   GFR calc non Af Amer >60 >60 mL/min   GFR calc Af Amer >60 >60 mL/min    Comment: (NOTE) The eGFR has been calculated using the CKD EPI equation. This calculation has not been validated in all clinical situations. eGFR's persistently <60 mL/min signify possible  Chronic Kidney Disease.    Anion gap 11 5 - 15    Comment: Performed at [facility], [address] [place], [place] [address]  CBC with Differential/Platelet  Status: Abnormal   Collection Time: 09/17/17  4:15 AM  Result Value Ref Range   WBC 4.0 4.0 - 10.5 K/uL   RBC 3.86 (L) 4.22 - 5.81 MIL/uL   Hemoglobin 10.6 (L) 13.0 - 17.0 g/dL   HCT 33.0 (L) 39.0 - 52.0 %   MCV 85.5 78.0 - 100.0 fL   MCH 27.5 26.0 - 34.0 pg   MCHC 32.1 30.0 - 36.0 g/dL   RDW 12.9 11.5 - 15.5 %   Platelets 163 150 - 400 K/uL   Neutrophils Relative % 70 %   Neutro Abs 2.8 1.7 - 7.7 K/uL   Lymphocytes Relative 22 %   Lymphs Abs 0.9 0.7 - 4.0 K/uL   Monocytes Relative 6 %   Monocytes Absolute 0.3 0.1 - 1.0 K/uL   Eosinophils Relative 2 %   Eosinophils Absolute 0.1 0.0 - 0.7 K/uL   Basophils Relative 0 %   Basophils Absolute 0.0 0.0 - 0.1 K/uL    Comment: Performed at [facility], [address] [place], [place] [address]  Glucose, capillary     Status: Abnormal   Collection Time: 09/17/17  7:38 AM  Result Value Ref Range   Glucose-Capillary 261 (H) 65 - 99 mg/dL   Comment 1 Notify RN    Comment 2 Document in Chart     ABGS No results for input(s): PHART, PO2ART, TCO2, HCO3 in the last 72 hours.  Invalid input(s): PCO2 CULTURES Recent Results (from the past 240 hour(s))  Respiratory Panel by PCR     Status: Abnormal   Collection Time: 09/15/17 10:15 AM  Result Value Ref Range Status   Adenovirus NOT DETECTED NOT DETECTED Final   Coronavirus 229E NOT DETECTED NOT DETECTED Final   Coronavirus HKU1 NOT DETECTED NOT DETECTED Final   Coronavirus NL63 NOT DETECTED NOT DETECTED Final   Coronavirus OC43 NOT DETECTED NOT DETECTED Final   Metapneumovirus NOT DETECTED NOT DETECTED Final   Rhinovirus / Enterovirus NOT DETECTED NOT DETECTED Final   Influenza A NOT DETECTED NOT DETECTED Final   Influenza B DETECTED (A) NOT DETECTED Final   Parainfluenza Virus 1 NOT DETECTED NOT DETECTED Final    Parainfluenza Virus 2 NOT DETECTED NOT DETECTED Final   Parainfluenza Virus 3 NOT DETECTED NOT DETECTED Final   Parainfluenza Virus 4 NOT DETECTED NOT DETECTED Final   Respiratory Syncytial Virus NOT DETECTED NOT DETECTED Final   Bordetella pertussis NOT DETECTED NOT DETECTED Final   Chlamydophila pneumoniae NOT DETECTED NOT DETECTED Final   Mycoplasma pneumoniae NOT DETECTED NOT DETECTED Final    Comment: Performed at [facility], [address] [place], [place] [address]  Culture, sputum-assessment     Status: None   Collection Time: 09/15/17  5:48 PM  Result Value Ref Range Status   Specimen Description EXPECTORATED SPUTUM  Final   Special Requests NONE  Final   Sputum evaluation   Final    THIS SPECIMEN IS ACCEPTABLE FOR SPUTUM CULTURE Performed at [facility], [address] [place], [place] [address]    Report Status 09/16/2017 FINAL  Final  Culture, respiratory (NON-Expectorated)     Status: None (Preliminary result)   Collection Time: 09/15/17  5:48 PM  Result Value Ref Range Status   Specimen Description   Final    EXPECTORATED SPUTUM Performed at [facility], [address] [place],  [address]    Special Requests   Final    NONE Reflexed from [id number] Performed at [facility], [address] [place], [place] [address]    Gram Stain   Final    RARE WBC PRESENT, PREDOMINANTLY PMN FEW YEAST Performed at [place] [address], [place], [place] [address]    Culture PENDING  Incomplete   Report Status PENDING  Incomplete   Studies/Results: No results found.  Medications:  Prior to Admission:  Medications Prior to Admission  Medication Sig Dispense Refill Last Dose  . albuterol (VENTOLIN HFA) 108 (90 BASE) MCG/ACT inhaler Inhale 2 puffs into the lungs every 6 (six) hours as needed for wheezing or shortness of breath.   07/03/2017 at Unknown time  . amoxicillin-clavulanate (AUGMENTIN) 875-125 MG tablet Take 1 tablet by mouth 2 (two) times  daily.   09/14/2017 at 1200  . baclofen (LIORESAL) 20 MG tablet Take 20 mg by mouth 3 (three) times daily.    09/14/2017 at Unknown time  . benzonatate (TESSALON) 200 MG capsule Take 200 mg by mouth 3 (three) times daily.   09/14/2017 at Unknown time  . CARTIA XT 120 MG 24 hr capsule Take 120 mg by mouth daily.    09/14/2017 at 1200  . fluticasone (FLONASE) 50 MCG/ACT nasal spray Place 2 sprays into both nostrils daily as needed for allergies.    07/03/2017 at Unknown time  . hydrOXYzine (VISTARIL) 25 MG capsule Take 25 mg by mouth every 6 (six) hours as needed for itching.    07/03/2017 at Unknown time  . levalbuterol (XOPENEX) 0.63 MG/3ML nebulizer solution Take 3 mLs (0.63 mg total) by nebulization every 8 (eight) hours as needed for wheezing or shortness of breath. 3 mL 12 09/14/2017 at 1200  . LORazepam (ATIVAN) 0.5 MG tablet Take 1 tablet (0.5 mg total) by mouth 2 (two) times daily as needed for anxiety or sleep. (Patient taking differently: Take 0.5 mg by mouth 4 (four) times daily as needed for anxiety or sleep. ) 20 tablet 0 Past Week at Unknown time  . metFORMIN (GLUCOPHAGE) 500 MG tablet Take 1 tablet (500 mg total) by mouth daily with breakfast. 30 tablet 3 09/14/2017 at 1200  . montelukast (SINGULAIR) 10 MG tablet Take 10 mg by mouth at bedtime.   09/14/2017 at Unknown time  . nitroGLYCERIN (NITROSTAT) 0.4 MG SL tablet Place 1 tablet (0.4 mg total) under the tongue every 5 (five) minutes as needed for chest pain. 25 tablet 4   . omeprazole (PRILOSEC) 20 MG capsule Take 20 mg by mouth daily.   09/14/2017 at Unknown time  . oxyCODONE-acetaminophen (PERCOCET) 10-325 MG tablet Take 1 tablet by mouth every 4 (four) hours as needed for pain.   09/14/2017 at Unknown time  . rifampin (RIFADIN) 300 MG capsule Take 2 capsules (600 mg total) by mouth daily. 60 capsule 5 09/14/2017 at 1200  . triamcinolone cream (KENALOG) 0.1 % Apply 1 application topically daily as needed (for itching/irritation).    Past Month at  Unknown time  . predniSONE (DELTASONE) 50 MG tablet Take one tablet daily for 4 days (Patient not taking: Reported on 09/15/2017) 4 tablet 0 Completed Course at Unknown time   Scheduled: . baclofen  20 mg Oral TID  . benzonatate  200 mg Oral TID  . diltiazem  120 mg Oral Daily  . enoxaparin (LOVENOX) injection  40 mg Subcutaneous Q24H  . fluticasone furoate-vilanterol  1 puff Inhalation Daily  . gabapentin  100 mg Oral QHS  . insulin aspart  0-15 Units Subcutaneous TID WC  . insulin aspart  0-5 Units Subcutaneous  QHS  . levalbuterol  0.63 mg Nebulization Q6H  . metFORMIN  500 mg Oral Q breakfast  . methylPREDNISolone sodium succinate  125 mg Intravenous Q8H  . montelukast  10 mg Oral QHS  . oseltamivir  75 mg Oral BID  . pantoprazole  40 mg Oral Daily  . rifampin  600 mg Oral Daily  . sodium chloride flush  3 mL Intravenous Q12H  . umeclidinium bromide  1 puff Inhalation Daily   Continuous: . sodium chloride    . piperacillin-tazobactam (ZOSYN)  IV    . vancomycin     RFV:OHKGOV chloride, acetaminophen **OR** acetaminophen, fluticasone, HYDROcodone-homatropine, hydrOXYzine, LORazepam, ondansetron **OR** ondansetron (ZOFRAN) IV, oxyCODONE-acetaminophen **AND** oxyCODONE, phenol, sodium chloride flush, triamcinolone cream  Assesment: He is admitted with acute hypoxic respiratory failure.  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  I had seen him in my office about 10 days ago and thought he had pneumonia surrounding his previous cavitary lesions based on his clinical appearance and put him on Augmentin.  This has not made much difference.  He still coughing up sputum.  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  He has acute on chronic hypoxic respiratory failure  He has chronic atrial fib not on anticoagulation because he we could not get him therapeutic while he was on rifampin  He has Mycobacterium  avium intracellulare and had been on rifampin and ethambutol but developed visual defects and the ethambutol has been dropped.  The problems were seen on his CT could be related to this and I will discuss with infectious disease  He has Principal Problem:   Acute respiratory failure with hypoxia ([facility]) Active Problems:   Cavitary lesion of lung   Diabetes (H[facility])   COPD exacerbation ([facility])   COPD (chronic obstructive pulmonary disease) ([place])   Respiratory failure ([place])    Plan: Switch to Zosyn and Vanco for now.  Continue other treatments.  Give him something for cough.  Continue IV steroids for now.    LOS: 1 day   Dagmawi Venable L 09/17/2017, 8:10 AM

## 2017-09-17 NOTE — Progress Notes (Signed)
Pharmacy Antibiotic Note  Ryan [name] is a 57 y.o. male admitted on 09/15/2017 with pneumonia.  Pharmacy has been consulted for Vancomycin and Zosyn dosing.  Plan: Vancomycin 750mg  IV q12hrs Zosyn 3.375gm IV q8h Monitor labs, progress, c/s  Antimicrobials this admission: Vancomycin 3/7 >>  Zosyn 3/7 >>  Tamiflu 3/6 >> Rifampin 3/5 >>  Dose adjustments this admission:  Microbiology results: 3/5 Resp Cx 3/5 Sputum: pending   Height: 5\' 8"  (172.7 cm) Weight: 140 lb (63.5 kg) IBW/kg (Calculated) : 68.4  Temp (24hrs), Avg:98.1 F (36.7 C), Min:97.7 F (36.5 C), Max:98.5 F (36.9 C)  Recent Labs  Lab 09/15/17 0440 09/16/17 0507 09/17/17 0415  WBC 7.2  --  4.0  CREATININE 1.03 0.98 0.91    Estimated Creatinine Clearance: 79.5 mL/min (by C-G formula based on SCr of 0.91 mg/dL).    Allergies  Allergen Reactions  . Albuterol Palpitations  . 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 Influenza Vaccine Live Swelling   Thank you for allowing pharmacy to be a part of this patient's care.  Hart Robinsons A 09/17/2017 12:38 PM

## 2017-09-18 LAB — GLUCOSE, CAPILLARY
Glucose-Capillary: 308 mg/dL — ABNORMAL HIGH (ref 65–99)
Glucose-Capillary: 322 mg/dL — ABNORMAL HIGH (ref 65–99)
Glucose-Capillary: 102 mg/dL — ABNORMAL HIGH (ref 65–99)
Glucose-Capillary: 365 mg/dL — ABNORMAL HIGH (ref 65–99)

## 2017-09-18 LAB — COMPREHENSIVE METABOLIC PANEL
ALK PHOS: 70 U/L (ref 38–126)
ALT: 23 U/L (ref 17–63)
AST: 25 U/L (ref 15–41)
Albumin: 3.1 g/dL — ABNORMAL LOW (ref 3.5–5.0)
Anion gap: 16 — ABNORMAL HIGH (ref 5–15)
BILIRUBIN TOTAL: 0.6 mg/dL (ref 0.3–1.2)
BUN: 15 mg/dL (ref 6–20)
CALCIUM: 8.6 mg/dL — AB (ref 8.9–10.3)
CO2: 25 mmol/L (ref 22–32)
CREATININE: 1.05 mg/dL (ref 0.61–1.24)
Chloride: 95 mmol/L — ABNORMAL LOW (ref 101–111)
GFR calc non Af Amer: >60 mL/min (ref >60)
GLUCOSE: 304 mg/dL — AB (ref 65–99)
Potassium: 4.3 mmol/L (ref 3.5–5.1)
SODIUM: 136 mmol/L (ref 135–145)
Total Protein: 6.8 g/dL (ref 6.5–8.1)

## 2017-09-18 MED ORDER — ORAL CARE MOUTH RINSE
15.0000 mL | Freq: Two times a day (BID) | OROMUCOSAL | Status: DC
  Administered 2017-09-18 – 2017-09-22 (×7): 15 mL via OROMUCOSAL

## 2017-09-18 MED ORDER — OXYCODONE HCL 5 MG PO TABS
15.0000 mg | ORAL_TABLET | ORAL | Status: DC | PRN
  Administered 2017-09-18 (×3): 15 mg via ORAL
  Administered 2017-09-19: 10 mg via ORAL
  Filled 2017-09-18 (×4): qty 3

## 2017-09-18 NOTE — Progress Notes (Signed)
Inpatient Diabetes Program Recommendations  AACE/ADA: New Consensus Statement on Inpatient Glycemic Control (2015)  Target Ranges:  Prepandial:   less than 140 mg/dL      Peak postprandial:   less than 180 mg/dL (1-2 hours)      Critically ill patients:  140 - 180 mg/dL   Lab Results  Component Value Date   GLUCAP 322 (H) 09/18/2017   HGBA1C 7.2 (H) 05/01/2015    Review of Glycemic Control Results for Ryan [name], [name] (MRN [id number]) as of 09/18/2017 11:46  Ref. Range 09/17/2017 07:38 09/17/2017 11:21 09/17/2017 16:17 09/18/2017 07:37 09/18/2017 11:25  Glucose-Capillary Latest Ref Range: 65 - 99 mg/dL 261 (H) 286 (H) 123 (H) 308 (H) 322 (H)   Diabetes history: DM2 Outpatient Diabetes medications: Metformin 500 mg qd Current orders for Inpatient glycemic control: Metformin 500 mg qd + Novolog moderate correction scale tid + hs 0-5 units  Inpatient Diabetes Program Recommendations:   If steroids continued as ordered: -Lantus 12 units qd (0.2 units/kg x 63.5 kg) -Novolog 3 units tid meal coverage if eats 50%  Thank you, Bethena Roys E. Dajah Fischman, RN, MSN, CDE  Diabetes Coordinator Inpatient Glycemic Control Team Team Pager [phone] (8am-5pm) 09/18/2017 11:49 AM

## 2017-09-18 NOTE — Care Management Important Message (Signed)
Important Message  Patient Details  Name: Ryan [name] MRN: [id number] Date of Birth: 08/14/1960   Medicare Important Message Given:  Yes    Sherald Barge, RN 09/18/2017, 1:21 PM

## 2017-09-18 NOTE — Progress Notes (Signed)
Subjective: He says he feels a little better than yesterday.  He is on antibiotics which seem to be working.  I was not able to get in touch with infectious disease yesterday but will attempt again today.  He is coughing up a lot of sputum now.  His pain is not well controlled I think because of the cough  Objective: Vital signs in last 24 hours: Temp:  [97.5 F (36.4 C)-97.8 F (36.6 C)] 97.6 F (36.4 C) (03/08 0500) Pulse Rate:  [71-76] 71 (03/08 0500) Resp:  [18] 18 (03/08 0500) BP: (112-120)/(64-78) 120/64 (03/08 0500) SpO2:  [95 %-100 %] 98 % (03/08 0809) Weight change:  Last BM Date: 09/17/17  Intake/Output from previous day: 03/07 0701 - 03/08 0700 In: 1761 [P.O.:1200; I.V.:6; IV Piggyback:500] Out: 500 [Urine:500]  PHYSICAL EXAM General appearance: alert, cooperative and mild distress Resp: rhonchi bilaterally Cardio: regular rate and rhythm, S1, S2 normal, no murmur, click, rub or gallop GI: soft, non-tender; bowel sounds normal; no masses,  no organomegaly Extremities: extremities normal, atraumatic, no cyanosis or edema Skin warm and dry  Lab Results:  Results for orders placed or performed during the hospital encounter of 09/15/17 (from the past 48 hour(s))  Glucose, capillary     Status: Abnormal   Collection Time: 09/16/17 11:35 AM  Result Value Ref Range   Glucose-Capillary 135 (H) 65 - 99 mg/dL   Comment 1 Notify RN    Comment 2 Document in Chart   Glucose, capillary     Status: Abnormal   Collection Time: 09/16/17  4:19 PM  Result Value Ref Range   Glucose-Capillary 216 (H) 65 - 99 mg/dL   Comment 1 Notify RN    Comment 2 Document in Chart   Glucose, capillary     Status: Abnormal   Collection Time: 09/16/17  9:50 PM  Result Value Ref Range   Glucose-Capillary 175 (H) 65 - 99 mg/dL  Basic metabolic panel     Status: Abnormal   Collection Time: 09/17/17  4:15 AM  Result Value Ref Range   Sodium 131 (L) 135 - 145 mmol/L   Potassium 4.4 3.5 - 5.1  mmol/L   Chloride 93 (L) 101 - 111 mmol/L   CO2 27 22 - 32 mmol/L   Glucose, Bld 331 (H) 65 - 99 mg/dL   BUN 16 6 - 20 mg/dL   Creatinine, Ser 0.91 0.61 - 1.24 mg/dL   Calcium 8.2 (L) 8.9 - 10.3 mg/dL   GFR calc non Af Amer >60 >60 mL/min   GFR calc Af Amer >60 >60 mL/min    Comment: (NOTE) The eGFR has been calculated using the CKD EPI equation. This calculation has not been validated in all clinical situations. eGFR's persistently <60 mL/min signify possible Chronic Kidney Disease.    Anion gap 11 5 - 15    Comment: Performed at [facility], [address] [place], [place] [address]  CBC with Differential/Platelet     Status: Abnormal   Collection Time: 09/17/17  4:15 AM  Result Value Ref Range   WBC 4.0 4.0 - 10.5 K/uL   RBC 3.86 (L) 4.22 - 5.81 MIL/uL   Hemoglobin 10.6 (L) 13.0 - 17.0 g/dL   HCT 33.0 (L) 39.0 - 52.0 %   MCV 85.5 78.0 - 100.0 fL   MCH 27.5 26.0 - 34.0 pg   MCHC 32.1 30.0 - 36.0 g/dL   RDW 12.9 11.5 - 15.5 %   Platelets 163 150 - 400 K/uL  Neutrophils Relative % 70 %   Neutro Abs 2.8 1.7 - 7.7 K/uL   Lymphocytes Relative 22 %   Lymphs Abs 0.9 0.7 - 4.0 K/uL   Monocytes Relative 6 %   Monocytes Absolute 0.3 0.1 - 1.0 K/uL   Eosinophils Relative 2 %   Eosinophils Absolute 0.1 0.0 - 0.7 K/uL   Basophils Relative 0 %   Basophils Absolute 0.0 0.0 - 0.1 K/uL    Comment: Performed at [facility], [address], [place], [place] [address]  Glucose, capillary     Status: Abnormal   Collection Time: 09/17/17  7:38 AM  Result Value Ref Range   Glucose-Capillary 261 (H) 65 - 99 mg/dL   Comment 1 Notify RN    Comment 2 Document in Chart   Glucose, capillary     Status: Abnormal   Collection Time: 09/17/17 11:21 AM  Result Value Ref Range   Glucose-Capillary 286 (H) 65 - 99 mg/dL   Comment 1 Notify RN    Comment 2 Document in Chart   Glucose, capillary     Status: Abnormal   Collection Time: 09/17/17  4:17 PM  Result Value Ref Range   Glucose-Capillary  123 (H) 65 - 99 mg/dL   Comment 1 Notify RN    Comment 2 Document in Chart   Comprehensive metabolic panel     Status: Abnormal   Collection Time: 09/18/17  5:09 AM  Result Value Ref Range   Sodium 136 135 - 145 mmol/L   Potassium 4.3 3.5 - 5.1 mmol/L   Chloride 95 (L) 101 - 111 mmol/L   CO2 25 22 - 32 mmol/L   Glucose, Bld 304 (H) 65 - 99 mg/dL   BUN 15 6 - 20 mg/dL   Creatinine, Ser 1.05 0.61 - 1.24 mg/dL   Calcium 8.6 (L) 8.9 - 10.3 mg/dL   Total Protein 6.8 6.5 - 8.1 g/dL   Albumin 3.1 (L) 3.5 - 5.0 g/dL   AST 25 15 - 41 U/L   ALT 23 17 - 63 U/L   Alkaline Phosphatase 70 38 - 126 U/L   Total Bilirubin 0.6 0.3 - 1.2 mg/dL   GFR calc non Af Amer >60 >60 mL/min   GFR calc Af Amer >60 >60 mL/min    Comment: (NOTE) The eGFR has been calculated using the CKD EPI equation. This calculation has not been validated in all clinical situations. eGFR's persistently <60 mL/min signify possible Chronic Kidney Disease.    Anion gap 16 (H) 5 - 15    Comment: Performed at [facility], [address] [place], [place] [address]  Glucose, capillary     Status: Abnormal   Collection Time: 09/18/17  7:37 AM  Result Value Ref Range   Glucose-Capillary 308 (H) 65 - 99 mg/dL   Comment 1 Notify RN    Comment 2 Document in Chart     ABGS No results for input(s): PHART, PO2ART, TCO2, HCO3 in the last 72 hours.  Invalid input(s): PCO2 CULTURES Recent Results (from the past 240 hour(s))  Respiratory Panel by PCR     Status: Abnormal   Collection Time: 09/15/17 10:15 AM  Result Value Ref Range Status   Adenovirus NOT DETECTED NOT DETECTED Final   Coronavirus 229E NOT DETECTED NOT DETECTED Final   Coronavirus HKU1 NOT DETECTED NOT DETECTED Final   Coronavirus NL63 NOT DETECTED NOT DETECTED Final   Coronavirus OC43 NOT DETECTED NOT DETECTED Final   Metapneumovirus NOT DETECTED NOT DETECTED Final  Rhinovirus / Enterovirus NOT DETECTED NOT DETECTED Final   Influenza A NOT DETECTED NOT  DETECTED Final   Influenza B DETECTED (A) NOT DETECTED Final   Parainfluenza Virus 1 NOT DETECTED NOT DETECTED Final   Parainfluenza Virus 2 NOT DETECTED NOT DETECTED Final   Parainfluenza Virus 3 NOT DETECTED NOT DETECTED Final   Parainfluenza Virus 4 NOT DETECTED NOT DETECTED Final   Respiratory Syncytial Virus NOT DETECTED NOT DETECTED Final   Bordetella pertussis NOT DETECTED NOT DETECTED Final   Chlamydophila pneumoniae NOT DETECTED NOT DETECTED Final   Mycoplasma pneumoniae NOT DETECTED NOT DETECTED Final    Comment: Performed at [facility], [place] [address], [place], [place] [address]  Culture, sputum-assessment     Status: None   Collection Time: 09/15/17  5:48 PM  Result Value Ref Range Status   Specimen Description EXPECTORATED SPUTUM  Final   Special Requests NONE  Final   Sputum evaluation   Final    THIS SPECIMEN IS ACCEPTABLE FOR SPUTUM CULTURE Performed at [facility], [address] [place], [place] [address]    Report Status 09/16/2017 FINAL  Final  Culture, respiratory (NON-Expectorated)     Status: None (Preliminary result)   Collection Time: 09/15/17  5:48 PM  Result Value Ref Range Status   Specimen Description   Final    EXPECTORATED SPUTUM Performed at [facility], [address], [place], [place] [address]    Special Requests   Final    NONE Reflexed from [phone] [id number] Performed at [facility], [address], [place], [place] [address]    Gram Stain RARE WBC PRESENT, PREDOMINANTLY PMN FEW YEAST   Final   Culture   Final    CULTURE REINCUBATED FOR BETTER GROWTH Performed at [facility], [place] [address], [place],  [address]    Report Status PENDING  Incomplete   Studies/Results: No results found.  Medications:  Prior to Admission:  Medications Prior to Admission  Medication Sig Dispense Refill Last Dose  . albuterol (VENTOLIN HFA) 108 (90 BASE) MCG/ACT inhaler Inhale 2 puffs into the lungs every 6 (six) hours as needed for wheezing or  shortness of breath.   07/03/2017 at Unknown time  . amoxicillin-clavulanate (AUGMENTIN) 875-125 MG tablet Take 1 tablet by mouth 2 (two) times daily.   09/14/2017 at 1200  . baclofen (LIORESAL) 20 MG tablet Take 20 mg by mouth 3 (three) times daily.    09/14/2017 at Unknown time  . benzonatate (TESSALON) 200 MG capsule Take 200 mg by mouth 3 (three) times daily.   09/14/2017 at Unknown time  . CARTIA XT 120 MG 24 hr capsule Take 120 mg by mouth daily.    09/14/2017 at 1200  . fluticasone (FLONASE) 50 MCG/ACT nasal spray Place 2 sprays into both nostrils daily as needed for allergies.    07/03/2017 at Unknown time  . hydrOXYzine (VISTARIL) 25 MG capsule Take 25 mg by mouth every 6 (six) hours as needed for itching.    07/03/2017 at Unknown time  . levalbuterol (XOPENEX) 0.63 MG/3ML nebulizer solution Take 3 mLs (0.63 mg total) by nebulization every 8 (eight) hours as needed for wheezing or shortness of breath. 3 mL 12 09/14/2017 at 1200  . LORazepam (ATIVAN) 0.5 MG tablet Take 1 tablet (0.5 mg total) by mouth 2 (two) times daily as needed for anxiety or sleep. (Patient taking differently: Take 0.5 mg by mouth 4 (four) times daily as needed for anxiety or sleep. ) 20 tablet  0 Past Week at Unknown time  . metFORMIN (GLUCOPHAGE) 500 MG tablet Take 1 tablet (500 mg total) by mouth daily with breakfast. 30 tablet 3 09/14/2017 at 1200  . montelukast (SINGULAIR) 10 MG tablet Take 10 mg by mouth at bedtime.   09/14/2017 at Unknown time  . nitroGLYCERIN (NITROSTAT) 0.4 MG SL tablet Place 1 tablet (0.4 mg total) under the tongue every 5 (five) minutes as needed for chest pain. 25 tablet 4   . omeprazole (PRILOSEC) 20 MG capsule Take 20 mg by mouth daily.   09/14/2017 at Unknown time  . oxyCODONE-acetaminophen (PERCOCET) 10-325 MG tablet Take 1 tablet by mouth every 4 (four) hours as needed for pain.   09/14/2017 at Unknown time  . rifampin (RIFADIN) 300 MG capsule Take 2 capsules (600 mg total) by mouth daily. 60 capsule 5  09/14/2017 at 1200  . triamcinolone cream (KENALOG) 0.1 % Apply 1 application topically daily as needed (for itching/irritation).    Past Month at Unknown time  . predniSONE (DELTASONE) 50 MG tablet Take one tablet daily for 4 days (Patient not taking: Reported on 09/15/2017) 4 tablet 0 Completed Course at Unknown time   Scheduled: . baclofen  20 mg Oral TID  . benzonatate  200 mg Oral TID  . diltiazem  120 mg Oral Daily  . enoxaparin (LOVENOX) injection  40 mg Subcutaneous Q24H  . fluticasone furoate-vilanterol  1 puff Inhalation Daily  . gabapentin  100 mg Oral QHS  . insulin aspart  0-15 Units Subcutaneous TID WC  . insulin aspart  0-5 Units Subcutaneous QHS  . levalbuterol  0.63 mg Nebulization Q6H  . mouth rinse  15 mL Mouth Rinse BID  . metFORMIN  500 mg Oral Q breakfast  . methylPREDNISolone sodium succinate  125 mg Intravenous Q8H  . montelukast  10 mg Oral QHS  . oseltamivir  75 mg Oral BID  . pantoprazole  40 mg Oral Daily  . rifampin  600 mg Oral Daily  . sodium chloride flush  3 mL Intravenous Q12H  . umeclidinium bromide  1 puff Inhalation Daily   Continuous: . sodium chloride    . piperacillin-tazobactam (ZOSYN)  IV 3.375 g (09/18/17 0821)  . vancomycin Stopped (09/17/17 2301)   ELY:HTMBPJ chloride, acetaminophen **OR** acetaminophen, fluticasone, HYDROcodone-homatropine, hydrOXYzine, LORazepam, ondansetron **OR** ondansetron (ZOFRAN) IV, oxyCODONE, phenol, sodium chloride flush, triamcinolone cream  Assesment: He was admitted with acute hypoxic respiratory failure.  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  He is been treated but he developed vision problems on ethambutol and that was discontinued.  I think he has some pneumonia in the areas of the cavities and that is being treated.  He has COPD at baseline.  He has chronic atrial fib which is stable.  He has diabetes and he is on sliding scale Principal Problem:   Acute respiratory failure  with hypoxia ([place]) Active Problems:   Cavitary lesion of lung   Diabetes (H[facility])   COPD exacerbation ([facility])   COPD (chronic obstructive pulmonary disease) ([place])   Respiratory failure ([place])    Plan: Continue current treatment he has improved    LOS: 2 days   Christohper Dube L 09/18/2017, 9:03 AM

## 2017-09-18 NOTE — Care Management (Signed)
Chart reviewed for needs. Pt from home with wife, ind with ADL's. Has home oxygen. Has transportation and insurance with drug coverage. Pt continues to smoke. Has all necessary DME. CM has made referral for [facility] transition calls.

## 2017-09-19 LAB — CULTURE, RESPIRATORY

## 2017-09-19 LAB — GLUCOSE, CAPILLARY
GLUCOSE-CAPILLARY: 334 mg/dL — AB (ref 65–99)
GLUCOSE-CAPILLARY: 129 mg/dL — AB (ref 65–99)
Glucose-Capillary: 372 mg/dL — ABNORMAL HIGH (ref 65–99)
Glucose-Capillary: 320 mg/dL — ABNORMAL HIGH (ref 65–99)
Glucose-Capillary: 354 mg/dL — ABNORMAL HIGH (ref 65–99)

## 2017-09-19 LAB — CULTURE, RESPIRATORY W GRAM STAIN

## 2017-09-19 LAB — BASIC METABOLIC PANEL
ANION GAP: 13 (ref 5–15)
BUN: 17 mg/dL (ref 6–20)
CALCIUM: 8.7 mg/dL — AB (ref 8.9–10.3)
CO2: 29 mmol/L (ref 22–32)
Chloride: 96 mmol/L — ABNORMAL LOW (ref 101–111)
Creatinine, Ser: 1.1 mg/dL (ref 0.61–1.24)
GFR calc Af Amer: >60 mL/min (ref >60)
GFR calc non Af Amer: >60 mL/min (ref >60)
GLUCOSE: 301 mg/dL — AB (ref 65–99)
Potassium: 4.5 mmol/L (ref 3.5–5.1)
Sodium: 138 mmol/L (ref 135–145)

## 2017-09-19 MED ORDER — HYDROMORPHONE HCL 1 MG/ML IJ SOLN
0.5000 mg | INTRAMUSCULAR | Status: DC | PRN
  Administered 2017-09-19 – 2017-09-21 (×9): 0.5 mg via INTRAVENOUS
  Filled 2017-09-19 (×10): qty 0.5

## 2017-09-19 MED ORDER — SACCHAROMYCES BOULARDII 250 MG PO CAPS
250.0000 mg | ORAL_CAPSULE | Freq: Two times a day (BID) | ORAL | Status: DC
  Administered 2017-09-19 – 2017-09-23 (×9): 250 mg via ORAL
  Filled 2017-09-19 (×9): qty 1

## 2017-09-19 MED ORDER — OXYCODONE-ACETAMINOPHEN 5-325 MG PO TABS
1.0000 | ORAL_TABLET | ORAL | Status: DC | PRN
  Administered 2017-09-19 – 2017-09-23 (×17): 1 via ORAL
  Filled 2017-09-19 (×17): qty 1

## 2017-09-19 MED ORDER — OXYCODONE HCL 5 MG PO TABS
5.0000 mg | ORAL_TABLET | ORAL | Status: DC | PRN
  Administered 2017-09-19 – 2017-09-23 (×17): 5 mg via ORAL
  Filled 2017-09-19 (×17): qty 1

## 2017-09-19 NOTE — Progress Notes (Signed)
Pt given sputum cup for AFB sample. Pt instructed on proper use. Pt states he understands and will obtain a sample early this morning before eating or drinking. Pt's RN, Elmyra Ricks, aware of need for sample also

## 2017-09-19 NOTE — Progress Notes (Signed)
Subjective: He says he feels okay.  He is still having pain.  He feels like he had some trouble with some abdominal discomfort that may be associated with his antibiotics.  He had trouble with incontinence of urine after taking the larger dose of pain medication so I am going to drop that back down.  He is still having some visual problems.  I discussed his situation with infectious disease by telephone yesterday.  I thought he was being followed at the infectious disease clinic but he has not been.  I think what happened is that he had been going to a different primary care physician and I thought notes were being sent to that physician but he is not been being seen at all.  He is off macrolide and off ethambutol so I am going to discontinue the rifampin to to try to keep him from developing resistance.  Objective: Vital signs in last 24 hours: Temp:  [97.6 F (36.4 C)] 97.6 F (36.4 C) (03/08 1518) Pulse Rate:  [70-80] 70 (03/09 0847) Resp:  [18] 18 (03/08 1518) BP: (103-129)/(70-80) 103/80 (03/09 0847) SpO2:  [95 %-99 %] 95 % (03/09 0847) Weight change:  Last BM Date: 09/18/17  Intake/Output from previous day: 03/08 0701 - 03/09 0700 In: 1558 [P.O.:1080; I.V.:3; IV Piggyback:450] Out: 1225 [Urine:1225]  PHYSICAL EXAM General appearance: alert, cooperative and mild distress Resp: rhonchi bilaterally Cardio: regular rate and rhythm, S1, S2 normal, no murmur, click, rub or gallop GI: soft, non-tender; bowel sounds normal; no masses,  no organomegaly Extremities: extremities normal, atraumatic, no cyanosis or edema Skin warm and dry  Lab Results:  Results for orders placed or performed during the hospital encounter of 09/15/17 (from the past 48 hour(s))  Glucose, capillary     Status: Abnormal   Collection Time: 09/17/17 11:21 AM  Result Value Ref Range   Glucose-Capillary 286 (H) 65 - 99 mg/dL   Comment 1 Notify RN    Comment 2 Document in Chart   Glucose, capillary     Status:  Abnormal   Collection Time: 09/17/17  4:17 PM  Result Value Ref Range   Glucose-Capillary 123 (H) 65 - 99 mg/dL   Comment 1 Notify RN    Comment 2 Document in Chart   Comprehensive metabolic panel     Status: Abnormal   Collection Time: 09/18/17  5:09 AM  Result Value Ref Range   Sodium 136 135 - 145 mmol/L   Potassium 4.3 3.5 - 5.1 mmol/L   Chloride 95 (L) 101 - 111 mmol/L   CO2 25 22 - 32 mmol/L   Glucose, Bld 304 (H) 65 - 99 mg/dL   BUN 15 6 - 20 mg/dL   Creatinine, Ser 1.05 0.61 - 1.24 mg/dL   Calcium 8.6 (L) 8.9 - 10.3 mg/dL   Total Protein 6.8 6.5 - 8.1 g/dL   Albumin 3.1 (L) 3.5 - 5.0 g/dL   AST 25 15 - 41 U/L   ALT 23 17 - 63 U/L   Alkaline Phosphatase 70 38 - 126 U/L   Total Bilirubin 0.6 0.3 - 1.2 mg/dL   GFR calc non Af Amer >60 >60 mL/min   GFR calc Af Amer >60 >60 mL/min    Comment: (NOTE) The eGFR has been calculated using the CKD EPI equation. This calculation has not been validated in all clinical situations. eGFR's persistently <60 mL/min signify possible Chronic Kidney Disease.    Anion gap 16 (H) 5 - 15    Comment:  Performed at [facility], [address] [place], [place] [address]  Glucose, capillary     Status: Abnormal   Collection Time: 09/18/17  7:37 AM  Result Value Ref Range   Glucose-Capillary 308 (H) 65 - 99 mg/dL   Comment 1 Notify RN    Comment 2 Document in Chart   Glucose, capillary     Status: Abnormal   Collection Time: 09/18/17 11:25 AM  Result Value Ref Range   Glucose-Capillary 322 (H) 65 - 99 mg/dL   Comment 1 Notify RN    Comment 2 Document in Chart   Glucose, capillary     Status: Abnormal   Collection Time: 09/18/17  4:10 PM  Result Value Ref Range   Glucose-Capillary 102 (H) 65 - 99 mg/dL   Comment 1 Notify RN    Comment 2 Document in Chart   Glucose, capillary     Status: Abnormal   Collection Time: 09/18/17  9:23 PM  Result Value Ref Range   Glucose-Capillary 365 (H) 65 - 99 mg/dL   Comment 1 Notify RN    Comment 2  Document in Chart   Basic metabolic panel     Status: Abnormal   Collection Time: 09/19/17  4:26 AM  Result Value Ref Range   Sodium 138 135 - 145 mmol/L   Potassium 4.5 3.5 - 5.1 mmol/L   Chloride 96 (L) 101 - 111 mmol/L   CO2 29 22 - 32 mmol/L   Glucose, Bld 301 (H) 65 - 99 mg/dL   BUN 17 6 - 20 mg/dL   Creatinine, Ser 1.10 0.61 - 1.24 mg/dL   Calcium 8.7 (L) 8.9 - 10.3 mg/dL   GFR calc non Af Amer >60 >60 mL/min   GFR calc Af Amer >60 >60 mL/min    Comment: (NOTE) The eGFR has been calculated using the CKD EPI equation. This calculation has not been validated in all clinical situations. eGFR's persistently <60 mL/min signify possible Chronic Kidney Disease.    Anion gap 13 5 - 15    Comment: Performed at [facility], [address] [place], [place] [address]  Glucose, capillary     Status: Abnormal   Collection Time: 09/19/17  7:26 AM  Result Value Ref Range   Glucose-Capillary 372 (H) 65 - 99 mg/dL   Comment 1 Notify RN    Comment 2 Document in Chart     ABGS No results for input(s): PHART, PO2ART, TCO2, HCO3 in the last 72 hours.  Invalid input(s): PCO2 CULTURES Recent Results (from the past 240 hour(s))  Respiratory Panel by PCR     Status: Abnormal   Collection Time: 09/15/17 10:15 AM  Result Value Ref Range Status   Adenovirus NOT DETECTED NOT DETECTED Final   Coronavirus 229E NOT DETECTED NOT DETECTED Final   Coronavirus HKU1 NOT DETECTED NOT DETECTED Final   Coronavirus NL63 NOT DETECTED NOT DETECTED Final   Coronavirus OC43 NOT DETECTED NOT DETECTED Final   Metapneumovirus NOT DETECTED NOT DETECTED Final   Rhinovirus / Enterovirus NOT DETECTED NOT DETECTED Final   Influenza A NOT DETECTED NOT DETECTED Final   Influenza B DETECTED (A) NOT DETECTED Final   Parainfluenza Virus 1 NOT DETECTED NOT DETECTED Final   Parainfluenza Virus 2 NOT DETECTED NOT DETECTED Final   Parainfluenza Virus 3 NOT DETECTED NOT DETECTED Final   Parainfluenza Virus 4 NOT DETECTED  NOT DETECTED Final   Respiratory Syncytial Virus NOT DETECTED NOT DETECTED Final   Bordetella pertussis NOT DETECTED  NOT DETECTED Final   Chlamydophila pneumoniae NOT DETECTED NOT DETECTED Final   Mycoplasma pneumoniae NOT DETECTED NOT DETECTED Final    Comment: Performed at [facility], [place] [address] [place], [place] [address]  Culture, sputum-assessment     Status: None   Collection Time: 09/15/17  5:48 PM  Result Value Ref Range Status   Specimen Description EXPECTORATED SPUTUM  Final   Special Requests NONE  Final   Sputum evaluation   Final    THIS SPECIMEN IS ACCEPTABLE FOR SPUTUM CULTURE Performed at [facility], [address] [place], [place] [address]    Report Status 09/16/2017 FINAL  Final  Culture, respiratory (NON-Expectorated)     Status: None (Preliminary result)   Collection Time: 09/15/17  5:48 PM  Result Value Ref Range Status   Specimen Description   Final    EXPECTORATED SPUTUM Performed at [facility], [address], [place], [place] [address]    Special Requests   Final    NONE Reflexed from [id number] Performed at [facility], [address] [place], [place] [address]    Gram Stain RARE WBC PRESENT, PREDOMINANTLY PMN FEW YEAST   Final   Culture   Final    CULTURE REINCUBATED FOR BETTER GROWTH Performed at [facility], [place] [address], [place], [place] [address]    Report Status PENDING  Incomplete   Studies/Results: No results found.  Medications:  Prior to Admission:  Medications Prior to Admission  Medication Sig Dispense Refill Last Dose  . albuterol (VENTOLIN HFA) 108 (90 BASE) MCG/ACT inhaler Inhale 2 puffs into the lungs every 6 (six) hours as needed for wheezing or shortness of breath.   07/03/2017 at Unknown time  . amoxicillin-clavulanate (AUGMENTIN) 875-125 MG tablet Take 1 tablet by mouth 2 (two) times daily.   09/14/2017 at 1200  . baclofen (LIORESAL) 20 MG tablet Take 20 mg by mouth 3 (three) times daily.    09/14/2017 at  Unknown time  . benzonatate (TESSALON) 200 MG capsule Take 200 mg by mouth 3 (three) times daily.   09/14/2017 at Unknown time  . CARTIA XT 120 MG 24 hr capsule Take 120 mg by mouth daily.    09/14/2017 at 1200  . fluticasone (FLONASE) 50 MCG/ACT nasal spray Place 2 sprays into both nostrils daily as needed for allergies.    07/03/2017 at Unknown time  . hydrOXYzine (VISTARIL) 25 MG capsule Take 25 mg by mouth every 6 (six) hours as needed for itching.    07/03/2017 at Unknown time  . levalbuterol (XOPENEX) 0.63 MG/3ML nebulizer solution Take 3 mLs (0.63 mg total) by nebulization every 8 (eight) hours as needed for wheezing or shortness of breath. 3 mL 12 09/14/2017 at 1200  . LORazepam (ATIVAN) 0.5 MG tablet Take 1 tablet (0.5 mg total) by mouth 2 (two) times daily as needed for anxiety or sleep. (Patient taking differently: Take 0.5 mg by mouth 4 (four) times daily as needed for anxiety or sleep. ) 20 tablet 0 Past Week at Unknown time  . metFORMIN (GLUCOPHAGE) 500 MG tablet Take 1 tablet (500 mg total) by mouth daily with breakfast. 30 tablet 3 09/14/2017 at 1200  . montelukast (SINGULAIR) 10 MG tablet Take 10 mg by mouth at bedtime.   09/14/2017 at Unknown time  . nitroGLYCERIN (NITROSTAT) 0.4 MG SL tablet Place 1 tablet (0.4 mg total) under the tongue every 5 (five) minutes as needed for chest pain. 25 tablet 4   . omeprazole (  PRILOSEC) 20 MG capsule Take 20 mg by mouth daily.   09/14/2017 at Unknown time  . oxyCODONE-acetaminophen (PERCOCET) 10-325 MG tablet Take 1 tablet by mouth every 4 (four) hours as needed for pain.   09/14/2017 at Unknown time  . rifampin (RIFADIN) 300 MG capsule Take 2 capsules (600 mg total) by mouth daily. 60 capsule 5 09/14/2017 at 1200  . triamcinolone cream (KENALOG) 0.1 % Apply 1 application topically daily as needed (for itching/irritation).    Past Month at Unknown time  . predniSONE (DELTASONE) 50 MG tablet Take one tablet daily for 4 days (Patient not taking: Reported on  09/15/2017) 4 tablet 0 Completed Course at Unknown time   Scheduled: . baclofen  20 mg Oral TID  . benzonatate  200 mg Oral TID  . diltiazem  120 mg Oral Daily  . enoxaparin (LOVENOX) injection  40 mg Subcutaneous Q24H  . fluticasone furoate-vilanterol  1 puff Inhalation Daily  . gabapentin  100 mg Oral QHS  . insulin aspart  0-15 Units Subcutaneous TID WC  . insulin aspart  0-5 Units Subcutaneous QHS  . levalbuterol  0.63 mg Nebulization Q6H  . mouth rinse  15 mL Mouth Rinse BID  . metFORMIN  500 mg Oral Q breakfast  . methylPREDNISolone sodium succinate  125 mg Intravenous Q8H  . montelukast  10 mg Oral QHS  . oseltamivir  75 mg Oral BID  . pantoprazole  40 mg Oral Daily  . sodium chloride flush  3 mL Intravenous Q12H  . umeclidinium bromide  1 puff Inhalation Daily   Continuous: . sodium chloride    . piperacillin-tazobactam (ZOSYN)  IV 3.375 g (09/19/17 0927)  . vancomycin Stopped (09/18/17 2228)   MIW:OEHOZY chloride, acetaminophen **OR** acetaminophen, fluticasone, HYDROcodone-homatropine, HYDROmorphone, hydrOXYzine, LORazepam, ondansetron **OR** ondansetron (ZOFRAN) IV, oxyCODONE-acetaminophen **AND** oxyCODONE, phenol, sodium chloride flush, triamcinolone cream  Assesment: He was admitted with acute hypoxic respiratory failure and pneumonia.  He is on appropriate treatment for that and is slowly improving.  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  However he has not been followed at the infectious disease clinic.  I have discontinued rifampin now to try to keep him from developing resistance.  He has diabetes which is fairly well controlled Principal Problem:   Acute respiratory failure with hypoxia ([place]) Active Problems:   Cavitary lesion of lung   Diabetes (HCC)   COPD exacerbation (HCC)   COPD (chronic obstructive pulmonary disease) (HCC)   Respiratory failure  ([place])    Plan: Check AFB.  He may need to restart treatment for MAC but I like to see if he shows that he and his sputum now and what his sensitivities are.  I will send him to the infectious disease clinic.  He will continue his treatment for his pneumonia.    LOS: 3 days   Yusef Lamp L 09/19/2017, 9:39 AM

## 2017-09-20 LAB — GLUCOSE, CAPILLARY
GLUCOSE-CAPILLARY: 373 mg/dL — AB (ref 65–99)
Glucose-Capillary: 375 mg/dL — ABNORMAL HIGH (ref 65–99)
Glucose-Capillary: 159 mg/dL — ABNORMAL HIGH (ref 65–99)
Glucose-Capillary: 397 mg/dL — ABNORMAL HIGH (ref 65–99)

## 2017-09-20 LAB — ACID FAST SMEAR (AFB)

## 2017-09-20 LAB — ACID FAST SMEAR (AFB, MYCOBACTERIA): Acid Fast Smear: NEGATIVE

## 2017-09-20 LAB — VANCOMYCIN, TROUGH: VANCOMYCIN TR: 12 ug/mL — AB (ref 15–20)

## 2017-09-20 MED ORDER — VANCOMYCIN HCL IN DEXTROSE 1-5 GM/200ML-% IV SOLN
1000.0000 mg | Freq: Two times a day (BID) | INTRAVENOUS | Status: DC
  Administered 2017-09-20 – 2017-09-21 (×4): 1000 mg via INTRAVENOUS
  Filled 2017-09-20 (×4): qty 200

## 2017-09-20 MED ORDER — METHYLPREDNISOLONE SODIUM SUCC 125 MG IJ SOLR
60.0000 mg | Freq: Three times a day (TID) | INTRAMUSCULAR | Status: DC
  Administered 2017-09-20 – 2017-09-22 (×6): 60 mg via INTRAVENOUS
  Filled 2017-09-20 (×6): qty 2

## 2017-09-20 NOTE — Progress Notes (Addendum)
Pharmacy Antibiotic Note  Jeri Rawlins Savard is a 57 y.o. male admitted on 09/15/2017 with pneumonia.  Pharmacy has been consulted for Vancomycin and Zosyn dosing. VT reported as 71mg/ml. Goal is 15-239m/ml. He is slowly improving. At baseline, he has a chronic cavitary lesion of the lung and was being treated for MAC. 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 Hence, the ethambutol was stopped and macrolide had been stopped according to Dr. HaLuan PullingTreatment now with vancomycin and zosyn. Patient is having AFB smears done and MD will f/u with cxs and have patient see ID.   Plan: Increase Vancomycin 100019mV q12hrs Contine Zosyn 3.375gm IV q8h Monitor labs, progress, c/s  Height: _0  (172.7 cm) Weight: 140 lb (63.5 kg) IBW/kg (Calculated) : 68.4  Temp (24hrs), Avg:97.6 F (36.4 C), Min:97.3 F (36.3 C), Max:97.8 F (36.6 C)  Recent Labs  Lab 09/15/17 0440 09/16/17 0507 09/17/17 0415 09/18/17 0509 09/19/17 0426 09/20/17 0912  WBC 7.2  --  4.0  --   --   --   CREATININE 1.03 0.98 0.91 1.05 1.10  --   VANCOTROUGH  --   --   --   --   --  12*    Estimated Creatinine Clearance: 65.7 mL/min (by C-G formula based on SCr of 1.1 mg/dL).    Allergies  Allergen Reactions  . Albuterol Palpitations  . 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 Influenza Vaccine Live Swelling   Antimicrobials this admission: Vancomycin 3/7 >>  Zosyn 3/7 >>  Tamiflu 3/6 >>  For 5 days thru 3/11 Rifampin 3/5 >>3/9   Dose adjustments this admission: 3/10 Increase vancomycin 1000m80m q12h  Microbiology results: 3/5 Sputum: abundant candida glabrata 3/9 AFB smear: pending  3/5 influenza B detected   Thank you for allowing pharmacy to be a part of this patient's care.  LoriIsac Sarna Pharm D, BCPSCalifornianical Pharmacist Pager #[phone] 10:53 AM

## 2017-09-20 NOTE — Progress Notes (Signed)
Patient sitting up appears more energetic today.  Leukosis in the 350 range we will decrease Solu-Medrol from 125-60 every 8 his wheezing is much less pronounced today.  Patient on Vancocin Zosyn doing well. Yadriel Kerrigan Giannone ZOX:[id number] DOB: 1960/08/14 DOA: 09/15/2017 PCP: [name], MD   Physical Exam: Blood pressure 132/77, pulse 60, temperature 97.8 F (36.6 C), temperature source Oral, resp. rate 18, height 5\' 8"  (1.727 m), weight 63.5 kg (140 lb), SpO2 96 %.  Lungs only trace end expiratory wheeze noted today diminished breath sounds in the bases no rales no rhonchi appreciable.  Heart regular rhythm no S3-S4 no heaves thrills or rub   Investigations:  Recent Results (from the past 240 hour(s))  Respiratory Panel by PCR     Status: Abnormal   Collection Time: 09/15/17 10:15 AM  Result Value Ref Range Status   Adenovirus NOT DETECTED NOT DETECTED Final   Coronavirus 229E NOT DETECTED NOT DETECTED Final   Coronavirus HKU1 NOT DETECTED NOT DETECTED Final   Coronavirus NL63 NOT DETECTED NOT DETECTED Final   Coronavirus OC43 NOT DETECTED NOT DETECTED Final   Metapneumovirus NOT DETECTED NOT DETECTED Final   Rhinovirus / Enterovirus NOT DETECTED NOT DETECTED Final   Influenza A NOT DETECTED NOT DETECTED Final   Influenza B DETECTED (A) NOT DETECTED Final   Parainfluenza Virus 1 NOT DETECTED NOT DETECTED Final   Parainfluenza Virus 2 NOT DETECTED NOT DETECTED Final   Parainfluenza Virus 3 NOT DETECTED NOT DETECTED Final   Parainfluenza Virus 4 NOT DETECTED NOT DETECTED Final   Respiratory Syncytial Virus NOT DETECTED NOT DETECTED Final   Bordetella pertussis NOT DETECTED NOT DETECTED Final   Chlamydophila pneumoniae NOT DETECTED NOT DETECTED Final   Mycoplasma pneumoniae NOT DETECTED NOT DETECTED Final    Comment: Performed at [facility], [place] [address] [place], [place] [address]  Culture, sputum-assessment     Status: None   Collection Time: 09/15/17  5:48 PM  Result  Value Ref Range Status   Specimen Description EXPECTORATED SPUTUM  Final   Special Requests NONE  Final   Sputum evaluation   Final    THIS SPECIMEN IS ACCEPTABLE FOR SPUTUM CULTURE Performed at [facility], [address] [place], [place] [address]    Report Status 09/16/2017 FINAL  Final  Culture, respiratory (NON-Expectorated)     Status: None   Collection Time: 09/15/17  5:48 PM  Result Value Ref Range Status   Specimen Description   Final    EXPECTORATED SPUTUM Performed at [facility], [address] [place], [place] [address]    Special Requests   Final    NONE Reflexed from [phone] [id number] Performed at [facility], [address] [place], [place] [address]    Gram Stain   Final    RARE WBC PRESENT, PREDOMINANTLY PMN FEW YEAST Performed at [facility], [place] [address], [place], [place] [address]    Culture ABUNDANT CANDIDA GLABRATA  Final   Report Status 09/19/2017 FINAL  Final     Basic Metabolic Panel: Recent Labs    09/18/17 0509 09/19/17 0426  NA 136 138  K 4.3 4.5  CL 95* 96*  CO2 25 29  GLUCOSE 304* 301*  BUN 15 17  CREATININE 1.05 1.10  CALCIUM 8.6* 8.7*   Liver Function Tests: Recent Labs    09/18/17 0509  AST 25  ALT 23  ALKPHOS 70  BILITOT 0.6  PROT 6.8  ALBUMIN 3.1*     CBC:  No results for input(s): WBC, NEUTROABS, HGB, HCT, MCV, PLT in the last 72 hours.  No results found.    Medications:   Impression:  Principal Problem:   Acute respiratory failure with hypoxia (HCC) Active Problems:   Cavitary lesion of lung   Diabetes (HCC)   COPD exacerbation (HCC)   COPD (chronic obstructive pulmonary disease) (HCC)   Respiratory failure ([place])     Plan: Continue vancomycin and Zosyn.  Decrease Solu-Medrol from 125-60 mg IV every 8 hours.  Continue sliding scale insulin coverage.  Consultants:     Procedures   Antibiotics: Vancomycin and Zosyn           Time spent: 30 minutes   LOS: 4 days   Dalisha Shively  M   09/20/2017, 12:26 PM

## 2017-09-21 DIAGNOSIS — J101 Influenza due to other identified influenza virus with other respiratory manifestations: Secondary | ICD-10-CM | POA: Diagnosis present

## 2017-09-21 LAB — GLUCOSE, CAPILLARY
GLUCOSE-CAPILLARY: 352 mg/dL — AB (ref 65–99)
GLUCOSE-CAPILLARY: 313 mg/dL — AB (ref 65–99)
Glucose-Capillary: 398 mg/dL — ABNORMAL HIGH (ref 65–99)
Glucose-Capillary: 199 mg/dL — ABNORMAL HIGH (ref 65–99)

## 2017-09-21 NOTE — Progress Notes (Signed)
Inpatient Diabetes Program Recommendations  AACE/ADA: New Consensus Statement on Inpatient Glycemic Control (2015)  Target Ranges:  Prepandial:   less than 140 mg/dL      Peak postprandial:   less than 180 mg/dL (1-2 hours)      Critically ill patients:  140 - 180 mg/dL   Lab Results  Component Value Date   GLUCAP 398 (H) 09/21/2017   HGBA1C 7.2 (H) 05/01/2015    Review of Glycemic Control Results for Ryan [name], Ryan [name] (MRN [id number]) as of 09/21/2017 12:25  Ref. Range 09/20/2017 11:23 09/20/2017 16:10 09/20/2017 22:11 09/21/2017 07:32 09/21/2017 11:26  Glucose-Capillary Latest Ref Range: 65 - 99 mg/dL 375 (H) 159 (H) 397 (H) 352 (H) 398 (H)   Diabetes history: DM2 Outpatient Diabetes medications: Metformin 500 mg qd Current orders for Inpatient glycemic control: Metformin 500 mg qd + Novolog moderate correction scale tid + hs 0-5 units  Inpatient Diabetes Program Recommendations:   If steroids continued as ordered: -Lantus 12 units qd (0.2 units/kg x 63.5 kg) -Novolog 3 units tid meal coverage if eats 50% -Decrease Novolog to sensitive correction Noted CBGs continue elevated even though steroids gradually decreased.  Thank you, Nani Gasser. Aunica Dauphinee, RN, MSN, CDE  Diabetes Coordinator Inpatient Glycemic Control Team Team Pager [phone] (8am-5pm) 09/21/2017 12:27 PM

## 2017-09-21 NOTE — Progress Notes (Addendum)
Subjective: He says he feels better.  He is still coughing productively but not as much.  His blood sugar has been high and his Solu-Medrol has been decreased.  Objective: Vital signs in last 24 hours: Temp:  [97.8 F (36.6 C)-98.2 F (36.8 C)] 97.8 F (36.6 C) (03/11 0508) Pulse Rate:  [72-80] 72 (03/11 0508) Resp:  [18] 18 (03/11 0508) BP: (130-131)/(72-94) 131/94 (03/11 0508) SpO2:  [96 %-100 %] 98 % (03/11 0508) Weight change:  Last BM Date: 09/20/17  Intake/Output from previous day: 03/10 0701 - 03/11 0700 In: 720 [P.O.:720] Out: 2750 [Urine:2750]  PHYSICAL EXAM General appearance: alert, cooperative and no distress Resp: Lungs sound much better but he still has prolonged expiratory phase and somewhat diminished breath sounds Cardio: regular rate and rhythm, S1, S2 normal, no murmur, click, rub or gallop GI: soft, non-tender; bowel sounds normal; no masses,  no organomegaly Extremities: extremities normal, atraumatic, no cyanosis or edema  Lab Results:  Results for orders placed or performed during the hospital encounter of 09/15/17 (from the past 48 hour(s))  Glucose, capillary     Status: Abnormal   Collection Time: 09/19/17 11:24 AM  Result Value Ref Range   Glucose-Capillary 320 (H) 65 - 99 mg/dL   Comment 1 Notify RN    Comment 2 Document in Chart   Glucose, capillary     Status: Abnormal   Collection Time: 09/19/17  1:30 PM  Result Value Ref Range   Glucose-Capillary 334 (H) 65 - 99 mg/dL  Glucose, capillary     Status: Abnormal   Collection Time: 09/19/17  4:18 PM  Result Value Ref Range   Glucose-Capillary 129 (H) 65 - 99 mg/dL   Comment 1 Notify RN    Comment 2 Document in Chart   Glucose, capillary     Status: Abnormal   Collection Time: 09/19/17  9:16 PM  Result Value Ref Range   Glucose-Capillary 354 (H) 65 - 99 mg/dL   Comment 1 Notify RN    Comment 2 Document in Chart   Glucose, capillary     Status: Abnormal   Collection Time: 09/20/17  7:56  AM  Result Value Ref Range   Glucose-Capillary 373 (H) 65 - 99 mg/dL   Comment 1 Notify RN    Comment 2 Document in Chart   Vancomycin, trough     Status: Abnormal   Collection Time: 09/20/17  9:12 AM  Result Value Ref Range   Vancomycin Tr 12 (L) 15 - 20 ug/mL    Comment: Performed at [facility], [address] [place], [place] [address]  Glucose, capillary     Status: Abnormal   Collection Time: 09/20/17 11:23 AM  Result Value Ref Range   Glucose-Capillary 375 (H) 65 - 99 mg/dL   Comment 1 Notify RN    Comment 2 Document in Chart   Glucose, capillary     Status: Abnormal   Collection Time: 09/20/17  4:10 PM  Result Value Ref Range   Glucose-Capillary 159 (H) 65 - 99 mg/dL   Comment 1 Notify RN    Comment 2 Document in Chart   Glucose, capillary     Status: Abnormal   Collection Time: 09/20/17 10:11 PM  Result Value Ref Range   Glucose-Capillary 397 (H) 65 - 99 mg/dL   Comment 1 Notify RN    Comment 2 Document in Chart   Glucose, capillary     Status: Abnormal   Collection Time: 09/21/17  7:32 AM  Result  Value Ref Range   Glucose-Capillary 352 (H) 65 - 99 mg/dL   Comment 1 Notify RN    Comment 2 Document in Chart     ABGS No results for input(s): PHART, PO2ART, TCO2, HCO3 in the last 72 hours.  Invalid input(s): PCO2 CULTURES Recent Results (from the past 240 hour(s))  Respiratory Panel by PCR     Status: Abnormal   Collection Time: 09/15/17 10:15 AM  Result Value Ref Range Status   Adenovirus NOT DETECTED NOT DETECTED Final   Coronavirus 229E NOT DETECTED NOT DETECTED Final   Coronavirus HKU1 NOT DETECTED NOT DETECTED Final   Coronavirus NL63 NOT DETECTED NOT DETECTED Final   Coronavirus OC43 NOT DETECTED NOT DETECTED Final   Metapneumovirus NOT DETECTED NOT DETECTED Final   Rhinovirus / Enterovirus NOT DETECTED NOT DETECTED Final   Influenza A NOT DETECTED NOT DETECTED Final   Influenza B DETECTED (A) NOT DETECTED Final   Parainfluenza Virus 1 NOT DETECTED  NOT DETECTED Final   Parainfluenza Virus 2 NOT DETECTED NOT DETECTED Final   Parainfluenza Virus 3 NOT DETECTED NOT DETECTED Final   Parainfluenza Virus 4 NOT DETECTED NOT DETECTED Final   Respiratory Syncytial Virus NOT DETECTED NOT DETECTED Final   Bordetella pertussis NOT DETECTED NOT DETECTED Final   Chlamydophila pneumoniae NOT DETECTED NOT DETECTED Final   Mycoplasma pneumoniae NOT DETECTED NOT DETECTED Final    Comment: Performed at [facility], [address], [place], [place] [address]  Culture, sputum-assessment     Status: None   Collection Time: 09/15/17  5:48 PM  Result Value Ref Range Status   Specimen Description EXPECTORATED SPUTUM  Final   Special Requests NONE  Final   Sputum evaluation   Final    THIS SPECIMEN IS ACCEPTABLE FOR SPUTUM CULTURE Performed at [facility], [address], [place], [place] [address]    Report Status 09/16/2017 FINAL  Final  Culture, respiratory (NON-Expectorated)     Status: None   Collection Time: 09/15/17  5:48 PM  Result Value Ref Range Status   Specimen Description   Final    EXPECTORATED SPUTUM Performed at [facility], [address] [place], [place] [address]    Special Requests   Final    NONE Reflexed from [phone] [id number] Performed at [facility], [address] [place], [place] [address]    Gram Stain   Final    RARE WBC PRESENT, PREDOMINANTLY PMN FEW YEAST Performed at [facility], [place] [address] [place], [place] [address]    Culture ABUNDANT CANDIDA [place]  Final   Report Status 09/19/2017 FINAL  Final  Acid Fast Smear (AFB)     Status: None   Collection Time: 09/19/17  6:00 AM  Result Value Ref Range Status   AFB Specimen Processing Concentration  Final   Acid Fast Smear Negative  Final    Comment: (NOTE) Performed At: [facility] [address] [place], [place] [id number] Rush Farmer MD TK:[id number]    Source (AFB) SPU  Final    Comment: Performed at [facility], [address]  [place], [place] [address]   Studies/Results: No results found.  Medications:  Prior to Admission:  Medications Prior to Admission  Medication Sig Dispense Refill Last Dose  . albuterol (VENTOLIN HFA) 108 (90 BASE) MCG/ACT inhaler Inhale 2 puffs into the lungs every 6 (six) hours as needed for wheezing or shortness of breath.   07/03/2017 at Unknown time  . amoxicillin-clavulanate (AUGMENTIN) 875-125 MG  tablet Take 1 tablet by mouth 2 (two) times daily.   09/14/2017 at 1200  . baclofen (LIORESAL) 20 MG tablet Take 20 mg by mouth 3 (three) times daily.    09/14/2017 at Unknown time  . benzonatate (TESSALON) 200 MG capsule Take 200 mg by mouth 3 (three) times daily.   09/14/2017 at Unknown time  . CARTIA XT 120 MG 24 hr capsule Take 120 mg by mouth daily.    09/14/2017 at 1200  . fluticasone (FLONASE) 50 MCG/ACT nasal spray Place 2 sprays into both nostrils daily as needed for allergies.    07/03/2017 at Unknown time  . hydrOXYzine (VISTARIL) 25 MG capsule Take 25 mg by mouth every 6 (six) hours as needed for itching.    07/03/2017 at Unknown time  . levalbuterol (XOPENEX) 0.63 MG/3ML nebulizer solution Take 3 mLs (0.63 mg total) by nebulization every 8 (eight) hours as needed for wheezing or shortness of breath. 3 mL 12 09/14/2017 at 1200  . LORazepam (ATIVAN) 0.5 MG tablet Take 1 tablet (0.5 mg total) by mouth 2 (two) times daily as needed for anxiety or sleep. (Patient taking differently: Take 0.5 mg by mouth 4 (four) times daily as needed for anxiety or sleep. ) 20 tablet 0 Past Week at Unknown time  . metFORMIN (GLUCOPHAGE) 500 MG tablet Take 1 tablet (500 mg total) by mouth daily with breakfast. 30 tablet 3 09/14/2017 at 1200  . montelukast (SINGULAIR) 10 MG tablet Take 10 mg by mouth at bedtime.   09/14/2017 at Unknown time  . nitroGLYCERIN (NITROSTAT) 0.4 MG SL tablet Place 1 tablet (0.4 mg total) under the tongue every 5 (five) minutes as needed for chest pain. 25 tablet 4   . omeprazole (PRILOSEC) 20 MG  capsule Take 20 mg by mouth daily.   09/14/2017 at Unknown time  . oxyCODONE-acetaminophen (PERCOCET) 10-325 MG tablet Take 1 tablet by mouth every 4 (four) hours as needed for pain.   09/14/2017 at Unknown time  . rifampin (RIFADIN) 300 MG capsule Take 2 capsules (600 mg total) by mouth daily. 60 capsule 5 09/14/2017 at 1200  . triamcinolone cream (KENALOG) 0.1 % Apply 1 application topically daily as needed (for itching/irritation).    Past Month at Unknown time  . predniSONE (DELTASONE) 50 MG tablet Take one tablet daily for 4 days (Patient not taking: Reported on 09/15/2017) 4 tablet 0 Completed Course at Unknown time   Scheduled: . baclofen  20 mg Oral TID  . benzonatate  200 mg Oral TID  . diltiazem  120 mg Oral Daily  . enoxaparin (LOVENOX) injection  40 mg Subcutaneous Q24H  . fluticasone furoate-vilanterol  1 puff Inhalation Daily  . gabapentin  100 mg Oral QHS  . insulin aspart  0-15 Units Subcutaneous TID WC  . insulin aspart  0-5 Units Subcutaneous QHS  . levalbuterol  0.63 mg Nebulization Q6H  . mouth rinse  15 mL Mouth Rinse BID  . metFORMIN  500 mg Oral Q breakfast  . methylPREDNISolone sodium succinate  60 mg Intravenous Q8H  . montelukast  10 mg Oral QHS  . pantoprazole  40 mg Oral Daily  . saccharomyces boulardii  250 mg Oral BID  . sodium chloride flush  3 mL Intravenous Q12H  . umeclidinium bromide  1 puff Inhalation Daily   Continuous: . sodium chloride    . piperacillin-tazobactam (ZOSYN)  IV Stopped (09/21/17 0340)  . vancomycin Stopped (09/20/17 2309)   RKY:HCWCBJ chloride, acetaminophen **OR** acetaminophen, fluticasone, HYDROcodone-homatropine, HYDROmorphone,  hydrOXYzine, LORazepam, ondansetron **OR** ondansetron (ZOFRAN) IV, oxyCODONE-acetaminophen **AND** oxyCODONE, phenol, sodium chloride flush, triamcinolone cream  Assesment: He was admitted with acute on chronic hypoxic respiratory failure because of pneumonia.  He is slowly improving.  He has influenza B and  has COPD exacerbation.  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  Acid-fast smear is negative and culture is pending  He has chronic hypoxic respiratory failure at baseline  He has diabetes and his control of his blood sugar was not as good because of his steroids  He has chronic pain and he is on pain medication Principal Problem:   Acute respiratory failure with hypoxia (HCC) Active Problems:   Cavitary lesion of lung   Diabetes (HCC)   COPD exacerbation (HCC)   COPD (chronic obstructive pulmonary disease) (HCC)   Respiratory failure ([place])   Influenza B    Plan: Continue treatments.  He is improving but not ready for discharge    LOS: 5 days   Shamila Lerch L 09/21/2017, 7:42 AM

## 2017-09-21 NOTE — Progress Notes (Signed)
IV site leaking while receiving Solumedrol. Site d/c'd .

## 2017-09-22 LAB — GLUCOSE, RANDOM: GLUCOSE: 436 mg/dL — AB (ref 65–99)

## 2017-09-22 LAB — GLUCOSE, CAPILLARY
GLUCOSE-CAPILLARY: 323 mg/dL — AB (ref 65–99)
Glucose-Capillary: 507 mg/dL (ref 65–99)
Glucose-Capillary: 226 mg/dL — ABNORMAL HIGH (ref 65–99)
Glucose-Capillary: 284 mg/dL — ABNORMAL HIGH (ref 65–99)
Glucose-Capillary: 364 mg/dL — ABNORMAL HIGH (ref 65–99)

## 2017-09-22 LAB — CREATININE, SERUM: Creatinine, Ser: 1.03 mg/dL (ref 0.61–1.24)

## 2017-09-22 MED ORDER — INSULIN GLARGINE 100 UNIT/ML ~~LOC~~ SOLN
12.0000 [IU] | Freq: Every day | SUBCUTANEOUS | Status: DC
  Administered 2017-09-22 – 2017-09-23 (×2): 12 [IU] via SUBCUTANEOUS
  Filled 2017-09-22 (×3): qty 0.12

## 2017-09-22 MED ORDER — INSULIN ASPART 100 UNIT/ML ~~LOC~~ SOLN
20.0000 [IU] | Freq: Once | SUBCUTANEOUS | Status: AC
  Administered 2017-09-22: 20 [IU] via SUBCUTANEOUS

## 2017-09-22 MED ORDER — CEFUROXIME AXETIL 250 MG PO TABS
500.0000 mg | ORAL_TABLET | Freq: Two times a day (BID) | ORAL | Status: DC
  Administered 2017-09-22 – 2017-09-23 (×2): 500 mg via ORAL
  Filled 2017-09-22 (×2): qty 2

## 2017-09-22 MED ORDER — PREDNISONE 20 MG PO TABS
40.0000 mg | ORAL_TABLET | Freq: Every day | ORAL | Status: DC
  Administered 2017-09-22 – 2017-09-23 (×2): 40 mg via ORAL
  Filled 2017-09-22 (×2): qty 2

## 2017-09-22 NOTE — Progress Notes (Signed)
Rechecked pt's CBG. It was 284.

## 2017-09-22 NOTE — Plan of Care (Signed)
progressing 

## 2017-09-22 NOTE — Care Management Note (Addendum)
Case Management Note  Patient Details  Name: Ryan [name] MRN: [id number] Date of Birth: January 30, 1961  If discussed at Long Length of Stay Meetings, dates discussed:  09/22/2017  Additional Comments: d/t pt's extended hospitalization and high risk for re-hospitalization CM has made referral to [facility]. Rep, Janci Minor, to speak with pt at bedside on services.   Sherald Barge, RN 09/22/2017, 1:18 PM

## 2017-09-22 NOTE — Consult Note (Signed)
   [facility] CM Inpatient Consult   09/22/2017  Ryan [name] 1961-04-08 [id number]   Referral received by inpatient CM for [place] Management services and post hospital discharge follow up related to a diagnosis of COPD, DM and 2 ED visits in the last 6 months. Patient was evaluated for community based chronic disease management services with [facility] care Management Program as a benefit of patient's [employer]. Met with the patient at the bedside to explain [place] Management services. Patient stated he has issues affording his medication at times. He is concerned about the costs of the medications he will be ordered when discharged. The patient stated he usually does not take insulin but since being in the hospital he has had to have insulin daily.  Verbal consent obtained patient agreeable to services. Patient gave [phone] as the best number to reach him. He also gave verbal permission to call his spouse [name] at [phone] if he can not be reached. Patient will receive post hospital discharge calls and be evaluated for monthly home visits. [facility] Care Management services does not interfere with or replace any services arranged by the inpatient care management team. RNCM left contact information and [facility] literature at the bedside. Made inpatient RNCM aware [facility] will be following for care management. For additional questions please contact:   Ryan Geisel RN, [facility] Liaison  [phone]) Business Mobile [phone]) Toll free office

## 2017-09-22 NOTE — Progress Notes (Signed)
CRITICAL VALUE ALERT  Critical Value:  Blood Glucose 507  Date & Time Notied:  09/22/17 1200  Provider Notified: Luan Pulling  Orders Received/Actions taken: 20 units of insulin given

## 2017-09-22 NOTE — Progress Notes (Signed)
Subjective: He is doing better.  He says he was able to get up and move around.  His cough is much improved.  No other new complaints.  His blood sugar is still high  Objective: Vital signs in last 24 hours: Temp:  [97.8 F (36.6 C)] 97.8 F (36.6 C) (03/12 0506) Pulse Rate:  [69-74] 74 (03/12 0506) Resp:  [18] 18 (03/12 0506) BP: (119-128)/(56-70) 119/56 (03/12 0506) SpO2:  [95 %-100 %] 97 % (03/12 0759) Weight change:  Last BM Date: 09/20/17  Intake/Output from previous day: 03/11 0701 - 03/12 0700 In: 1820 [P.O.:720; IV Piggyback:1100] Out: 3400 [Urine:3400]  PHYSICAL EXAM General appearance: alert, cooperative and no distress Resp: clear to auscultation bilaterally Cardio: regular rate and rhythm, S1, S2 normal, no murmur, click, rub or gallop GI: soft, non-tender; bowel sounds normal; no masses,  no organomegaly Extremities: extremities normal, atraumatic, no cyanosis or edema  Lab Results:  Results for orders placed or performed during the hospital encounter of 09/15/17 (from the past 48 hour(s))  Vancomycin, trough     Status: Abnormal   Collection Time: 09/20/17  9:12 AM  Result Value Ref Range   Vancomycin Tr 12 (L) 15 - 20 ug/mL    Comment: Performed at [facility], [address] [place], [place] [address]  Glucose, capillary     Status: Abnormal   Collection Time: 09/20/17 11:23 AM  Result Value Ref Range   Glucose-Capillary 375 (H) 65 - 99 mg/dL   Comment 1 Notify RN    Comment 2 Document in Chart   Glucose, capillary     Status: Abnormal   Collection Time: 09/20/17  4:10 PM  Result Value Ref Range   Glucose-Capillary 159 (H) 65 - 99 mg/dL   Comment 1 Notify RN    Comment 2 Document in Chart   Glucose, capillary     Status: Abnormal   Collection Time: 09/20/17 10:11 PM  Result Value Ref Range   Glucose-Capillary 397 (H) 65 - 99 mg/dL   Comment 1 Notify RN    Comment 2 Document in Chart   Glucose, capillary     Status: Abnormal   Collection Time:  09/21/17  7:32 AM  Result Value Ref Range   Glucose-Capillary 352 (H) 65 - 99 mg/dL   Comment 1 Notify RN    Comment 2 Document in Chart   Glucose, capillary     Status: Abnormal   Collection Time: 09/21/17 11:26 AM  Result Value Ref Range   Glucose-Capillary 398 (H) 65 - 99 mg/dL   Comment 1 Notify RN    Comment 2 Document in Chart   Glucose, capillary     Status: Abnormal   Collection Time: 09/21/17  4:50 PM  Result Value Ref Range   Glucose-Capillary 199 (H) 65 - 99 mg/dL   Comment 1 Notify RN    Comment 2 Document in Chart   Glucose, capillary     Status: Abnormal   Collection Time: 09/21/17  8:27 PM  Result Value Ref Range   Glucose-Capillary 313 (H) 65 - 99 mg/dL   Comment 1 Notify RN    Comment 2 Document in Chart   Creatinine, serum     Status: None   Collection Time: 09/22/17  4:44 AM  Result Value Ref Range   Creatinine, Ser 1.03 0.61 - 1.24 mg/dL   GFR calc non Af Amer >60 >60 mL/min   GFR calc Af Amer >60 >60 mL/min    Comment: (NOTE) The eGFR  has been calculated using the CKD EPI equation. This calculation has not been validated in all clinical situations. eGFR's persistently <60 mL/min signify possible Chronic Kidney Disease. Performed at [facility], [address] [place], [place] [address]     ABGS No results for input(s): PHART, PO2ART, TCO2, HCO3 in the last 72 hours.  Invalid input(s): PCO2 CULTURES Recent Results (from the past 240 hour(s))  Respiratory Panel by PCR     Status: Abnormal   Collection Time: 09/15/17 10:15 AM  Result Value Ref Range Status   Adenovirus NOT DETECTED NOT DETECTED Final   Coronavirus 229E NOT DETECTED NOT DETECTED Final   Coronavirus HKU1 NOT DETECTED NOT DETECTED Final   Coronavirus NL63 NOT DETECTED NOT DETECTED Final   Coronavirus OC43 NOT DETECTED NOT DETECTED Final   Metapneumovirus NOT DETECTED NOT DETECTED Final   Rhinovirus / Enterovirus NOT DETECTED NOT DETECTED Final   Influenza A NOT DETECTED NOT  DETECTED Final   Influenza B DETECTED (A) NOT DETECTED Final   Parainfluenza Virus 1 NOT DETECTED NOT DETECTED Final   Parainfluenza Virus 2 NOT DETECTED NOT DETECTED Final   Parainfluenza Virus 3 NOT DETECTED NOT DETECTED Final   Parainfluenza Virus 4 NOT DETECTED NOT DETECTED Final   Respiratory Syncytial Virus NOT DETECTED NOT DETECTED Final   Bordetella pertussis NOT DETECTED NOT DETECTED Final   Chlamydophila pneumoniae NOT DETECTED NOT DETECTED Final   Mycoplasma pneumoniae NOT DETECTED NOT DETECTED Final    Comment: Performed at [facility], [address] [place], [place] [address]  Culture, sputum-assessment     Status: None   Collection Time: 09/15/17  5:48 PM  Result Value Ref Range Status   Specimen Description EXPECTORATED SPUTUM  Final   Special Requests NONE  Final   Sputum evaluation   Final    THIS SPECIMEN IS ACCEPTABLE FOR SPUTUM CULTURE Performed at [facility], [address] [place], [place] [address]    Report Status 09/16/2017 FINAL  Final  Culture, respiratory (NON-Expectorated)     Status: None   Collection Time: 09/15/17  5:48 PM  Result Value Ref Range Status   Specimen Description   Final    EXPECTORATED SPUTUM Performed at [facility], [address], [place], [place] [address]    Special Requests   Final    NONE Reflexed from [id number] Performed at [facility], [address] [place], [place] [address]    Gram Stain   Final    RARE WBC PRESENT, PREDOMINANTLY PMN FEW YEAST Performed at [facility], [place] [address], [place], [place] [address]    Culture ABUNDANT CAND[place]  Final   Report Status 09/19/2017 FINAL  Final  Acid Fast Smear (AFB)     Status: None   Collection Time: 09/19/17  6:00 AM  Result Value Ref Range Status   AFB Specimen Processing Concentration  Final   Acid Fast Smear Negative  Final    Comment: (NOTE) Performed At: [facility] [address] [place], [place] [id number] Rush Farmer MD  KZ:[id number]    Source (AFB) SPU  Final    Comment: Performed at [facility], [address], [place],  [address]   Studies/Results: No results found.  Medications:  Prior to Admission:  Medications Prior to Admission  Medication Sig Dispense Refill Last Dose  . albuterol (VENTOLIN HFA) 108 (90 BASE) MCG/ACT inhaler Inhale 2 puffs into the lungs every 6 (six) hours as needed for wheezing or shortness of breath.  07/03/2017 at Unknown time  . amoxicillin-clavulanate (AUGMENTIN) 875-125 MG tablet Take 1 tablet by mouth 2 (two) times daily.   09/14/2017 at 1200  . baclofen (LIORESAL) 20 MG tablet Take 20 mg by mouth 3 (three) times daily.    09/14/2017 at Unknown time  . benzonatate (TESSALON) 200 MG capsule Take 200 mg by mouth 3 (three) times daily.   09/14/2017 at Unknown time  . CARTIA XT 120 MG 24 hr capsule Take 120 mg by mouth daily.    09/14/2017 at 1200  . fluticasone (FLONASE) 50 MCG/ACT nasal spray Place 2 sprays into both nostrils daily as needed for allergies.    07/03/2017 at Unknown time  . hydrOXYzine (VISTARIL) 25 MG capsule Take 25 mg by mouth every 6 (six) hours as needed for itching.    07/03/2017 at Unknown time  . levalbuterol (XOPENEX) 0.63 MG/3ML nebulizer solution Take 3 mLs (0.63 mg total) by nebulization every 8 (eight) hours as needed for wheezing or shortness of breath. 3 mL 12 09/14/2017 at 1200  . LORazepam (ATIVAN) 0.5 MG tablet Take 1 tablet (0.5 mg total) by mouth 2 (two) times daily as needed for anxiety or sleep. (Patient taking differently: Take 0.5 mg by mouth 4 (four) times daily as needed for anxiety or sleep. ) 20 tablet 0 Past Week at Unknown time  . metFORMIN (GLUCOPHAGE) 500 MG tablet Take 1 tablet (500 mg total) by mouth daily with breakfast. 30 tablet 3 09/14/2017 at 1200  . montelukast (SINGULAIR) 10 MG tablet Take 10 mg by mouth at bedtime.   09/14/2017 at Unknown time  . nitroGLYCERIN (NITROSTAT) 0.4 MG SL tablet Place 1 tablet (0.4 mg total) under the  tongue every 5 (five) minutes as needed for chest pain. 25 tablet 4   . omeprazole (PRILOSEC) 20 MG capsule Take 20 mg by mouth daily.   09/14/2017 at Unknown time  . oxyCODONE-acetaminophen (PERCOCET) 10-325 MG tablet Take 1 tablet by mouth every 4 (four) hours as needed for pain.   09/14/2017 at Unknown time  . rifampin (RIFADIN) 300 MG capsule Take 2 capsules (600 mg total) by mouth daily. 60 capsule 5 09/14/2017 at 1200  . triamcinolone cream (KENALOG) 0.1 % Apply 1 application topically daily as needed (for itching/irritation).    Past Month at Unknown time  . predniSONE (DELTASONE) 50 MG tablet Take one tablet daily for 4 days (Patient not taking: Reported on 09/15/2017) 4 tablet 0 Completed Course at Unknown time   Scheduled: . baclofen  20 mg Oral TID  . benzonatate  200 mg Oral TID  . diltiazem  120 mg Oral Daily  . enoxaparin (LOVENOX) injection  40 mg Subcutaneous Q24H  . fluticasone furoate-vilanterol  1 puff Inhalation Daily  . gabapentin  100 mg Oral QHS  . insulin aspart  0-15 Units Subcutaneous TID WC  . insulin aspart  0-5 Units Subcutaneous QHS  . insulin glargine  12 Units Subcutaneous Daily  . levalbuterol  0.63 mg Nebulization Q6H  . mouth rinse  15 mL Mouth Rinse BID  . metFORMIN  500 mg Oral Q breakfast  . montelukast  10 mg Oral QHS  . pantoprazole  40 mg Oral Daily  . predniSONE  40 mg Oral Q breakfast  . saccharomyces boulardii  250 mg Oral BID  . sodium chloride flush  3 mL Intravenous Q12H  . umeclidinium bromide  1 puff Inhalation Daily   Continuous: . sodium chloride    . piperacillin-tazobactam (ZOSYN)  IV Stopped (  09/22/17 0310)  . vancomycin Stopped (09/22/17 0016)   CHJ:SCBIPJ chloride, acetaminophen **OR** acetaminophen, fluticasone, HYDROcodone-homatropine, HYDROmorphone, hydrOXYzine, LORazepam, ondansetron **OR** ondansetron (ZOFRAN) IV, oxyCODONE-acetaminophen **AND** oxyCODONE, phenol, sodium chloride flush, triamcinolone cream  Assesment: He was  admitted with acute hypoxic respiratory failure.  He has pneumonia which is being treated and has improved.  He has a cavitary lesion of the lung and had what looked like some infiltrate around that and he was started on outpatient treatment but failed that.  He has severe COPD at baseline.  He has history of Mycobacterium avium infection and AFB smear here is negative.  He has chronic hypoxic respiratory failure which is better  He has diabetes and his blood sugar has been up I think from being sick and from steroids  He has chronic pain and is on pain medication which is working Principal Problem:   Acute respiratory failure with hypoxia ([place]) Active Problems:   Cavitary lesion of lung   Diabetes (HCC)   COPD exacerbation (HCC)   COPD (chronic obstructive pulmonary disease) ([place])   Respiratory failure ([place])   Influenza B    Plan: Switch to prednisone.  Add Lantus and see how he does his sugar should improve with being off the Solu-Medrol.  And I encouraged him to walk around some more.  Potential discharge in the next 48 hours    LOS: 6 days   Ryan [name] L 09/22/2017, 8:07 AM

## 2017-09-23 ENCOUNTER — Other Ambulatory Visit: Payer: Self-pay | Admitting: *Deleted

## 2017-09-23 LAB — GLUCOSE, CAPILLARY
Glucose-Capillary: 269 mg/dL — ABNORMAL HIGH (ref 65–99)
Glucose-Capillary: 340 mg/dL — ABNORMAL HIGH (ref 65–99)

## 2017-09-23 MED ORDER — PREDNISONE 10 MG (21) PO TBPK: ORAL_TABLET | ORAL | 0 refills | Status: DC

## 2017-09-23 MED ORDER — CEFUROXIME AXETIL 500 MG PO TABS: 500.0000 mg | ORAL_TABLET | Freq: Two times a day (BID) | ORAL | 0 refills | Status: DC

## 2017-09-23 MED ORDER — SACCHAROMYCES BOULARDII 250 MG PO CAPS: 250.0000 mg | ORAL_CAPSULE | Freq: Two times a day (BID) | ORAL | 1 refills | Status: DC

## 2017-09-23 MED ORDER — FLUTICASONE-UMECLIDIN-VILANT 100-62.5-25 MCG/INH IN AEPB: 1.0000 | INHALATION_SPRAY | Freq: Every day | RESPIRATORY_TRACT | 12 refills | Status: DC

## 2017-09-23 MED ORDER — INSULIN GLARGINE 100 UNIT/ML ~~LOC~~ SOLN: 12.0000 [IU] | Freq: Every day | SUBCUTANEOUS | 11 refills | Status: DC

## 2017-09-23 MED ORDER — AZITHROMYCIN 250 MG PO TABS: ORAL_TABLET | ORAL | 0 refills | Status: DC

## 2017-09-23 MED ORDER — INSULIN LISPRO 100 UNIT/ML ~~LOC~~ SOLN: SUBCUTANEOUS | 11 refills | Status: AC

## 2017-09-23 NOTE — Care Management Note (Addendum)
Case Management Note  Patient Details  Name: Ryan [name] MRN: [id number] Date of Birth: [birth year]  Subjective/Objective:    Admitted with flu, COPD exacerbation, pneumonia. From home with wife, ind with ADL's. Has home oxygen and PCP, no transportation or medication needs.  Pt continues to smoke despite progressive, extensive lung disease.                Action/Plan: DC home today with referral for [facility]. Pt has chosen [facility] from list of providers. Aware HH has 48 hrs to make first visit. CM has notified [facility] rep, of referral, will pull pt info from Epic. Pt's wife will bring O2 from home for transport. Pt has also agreed to [facility] CM serivces.   Expected Discharge Date:  09/23/17               Expected Discharge Plan:  [name]  In-House Referral:  NA  Discharge planning Services  CM Consult  Post Acute Care Choice:  Home Health Choice offered to:  Patient  HH Arranged:  RN [facility] Agency:  [facility]  Status of Service:  Completed, signed off   Addendum: [name] unable to take pt d/t payor source. CM will refer to [name], pt aware and agreeable. [facility] rep, aware of referral.   Sherald Barge, RN 09/23/2017, 10:08 AM

## 2017-09-23 NOTE — Discharge Summary (Signed)
Physician Discharge Summary  Patient ID: Ryan [name] MRN: [id number] DOB/AGE: 1961/03/11 57 y.o. Primary Care Physician:Hyden Soley, Percell Miller, MD Admit date: 09/15/2017 Discharge date: 09/23/2017    Discharge Diagnoses:   Principal Problem:   Acute respiratory failure with hypoxia [facility]) Active Problems:   Cavitary lesion of lung   Pulmonary cavitary lesion   Diabetes ([place])   Acute on chronic respiratory failure (HCC)   COPD exacerbation (HCC)   Paroxysmal atrial fibrillation (HCC)   Chronic respiratory failure with hypoxia (HCC)   COPD (chronic obstructive pulmonary disease) (HCC)   MAI (mycobacterium avium-intracellulare) (HCC)   Respiratory failure (HCC)   Influenza B Anxiety Chronic low back pain  Allergies as of 09/23/2017      Reactions   Albuterol Palpitations   Ciprofloxacin Other (See Comments)   Patient experiences vision loss from long-term and short-term use   Influenza Vaccine Live Swelling      Medication List    STOP taking these medications   amoxicillin-clavulanate 875-125 MG tablet Commonly known as:  AUGMENTIN   predniSONE 50 MG tablet Commonly known as:  DELTASONE Replaced by:  predniSONE 10 MG (21) Tbpk tablet   rifampin 300 MG capsule Commonly known as:  RIFADIN     TAKE these medications   azithromycin 250 MG tablet Commonly known as:  ZITHROMAX Z-PAK Take by package instructions   baclofen 20 MG tablet Commonly known as:  LIORESAL Take 20 mg by mouth 3 (three) times daily.   benzonatate 200 MG capsule Commonly known as:  TESSALON Take 200 mg by mouth 3 (three) times daily.   CARTIA XT 120 MG 24 hr capsule Generic drug:  diltiazem Take 120 mg by mouth daily.   cefUROXime 500 MG tablet Commonly known as:  CEFTIN Take 1 tablet (500 mg total) by mouth 2 (two) times daily with a meal.   fluticasone 50 MCG/ACT nasal spray Commonly known as:  FLONASE Place 2 sprays into both nostrils daily as needed for allergies.    Fluticasone-Umeclidin-Vilant 100-62.5-25 MCG/INH Aepb Commonly known as:  TRELEGY ELLIPTA Inhale 1 puff into the lungs daily. Rinse mouth after you use this   hydrOXYzine 25 MG capsule Commonly known as:  VISTARIL Take 25 mg by mouth every 6 (six) hours as needed for itching.   insulin glargine 100 UNIT/ML injection Commonly known as:  LANTUS Inject 0.12 mLs (12 Units total) into the skin daily.   insulin lispro 100 UNIT/ML injection Commonly known as:  HUMALOG Take by sliding scale provided by hospital maximum amount daily is 60 units.  Check blood sugar 4 times a day   levalbuterol 0.63 MG/3ML nebulizer solution Commonly known as:  XOPENEX Take 3 mLs (0.63 mg total) by nebulization every 8 (eight) hours as needed for wheezing or shortness of breath.   LORazepam 0.5 MG tablet Commonly known as:  ATIVAN Take 1 tablet (0.5 mg total) by mouth 2 (two) times daily as needed for anxiety or sleep. What changed:  when to take this   metFORMIN 500 MG tablet Commonly known as:  GLUCOPHAGE Take 1 tablet (500 mg total) by mouth daily with breakfast.   montelukast 10 MG tablet Commonly known as:  SINGULAIR Take 10 mg by mouth at bedtime.   nitroGLYCERIN 0.4 MG SL tablet Commonly known as:  NITROSTAT Place 1 tablet (0.4 mg total) under the tongue every 5 (five) minutes as needed for chest pain.   omeprazole 20 MG capsule Commonly known as:  PRILOSEC Take 20 mg by mouth daily.  oxyCODONE-acetaminophen 10-325 MG tablet Commonly known as:  PERCOCET Take 1 tablet by mouth every 4 (four) hours as needed for pain.   predniSONE 10 MG (21) Tbpk tablet Commonly known as:  STERAPRED UNI-PAK 21 TAB Take by package instructions Replaces:  predniSONE 50 MG tablet   saccharomyces boulardii 250 MG capsule Commonly known as:  FLORASTOR Take 1 capsule (250 mg total) by mouth 2 (two) times daily.   triamcinolone cream 0.1 % Commonly known as:  KENALOG Apply 1 application topically daily  as needed (for itching/irritation).   VENTOLIN HFA 108 (90 Base) MCG/ACT inhaler Generic drug:  albuterol Inhale 2 puffs into the lungs every 6 (six) hours as needed for wheezing or shortness of breath.       Discharged Condition: Improved    Consults: None  Significant Diagnostic Studies: Ct Chest Wo Contrast  Result Date: 09/02/2017 CLINICAL DATA:  Pneumonia. Pulmonary lesion follow up. Shortness of breath. COPD. Emphysema. EXAM: CT CHEST WITHOUT CONTRAST TECHNIQUE: Multidetector CT imaging of the chest was performed following the standard protocol without IV contrast. COMPARISON:  Multiple exams, including 02/12/2017 FINDINGS: Cardiovascular: Coronary, aortic arch, and branch vessel atherosclerotic vascular disease. Mediastinum/Nodes: Right hilar lymph node 1.2 cm in short axis on image 86/2, stable. Additional notable bilateral hilar nodal tissue. These nodes are difficult to measure due to the lack of IV contrast causing than to be of the same density is adjacent vessels. Small type 1 hiatal hernia. Lungs/Pleura: Markedly severe emphysema. Chronic interstitial accentuation posteriorly in the right lower lobe, coarsely irregular. Cavitary left upper lobe lesion measuring 5.2 by 2.7 by 7.0 cm has a band of internal density and wall irregularity with variable thickness. There is increase in airspace opacity along the posterior inferior margin of this lesion on image 130/6 compared to the prior exam. This could be from atelectasis or pneumonia adjacent to the lesion. Otherwise the morphology is similar to prior. This lesion has had a slow evolution and shape back from April 2015 but also has a lot of similarities back through April 2015. The medial nodularity in the left upper lobe on image 41/4 appears stable measuring 1.7 by 0.6 cm with a small amount of internal gas density as before. A lingular nodule measures 0.9 by 0.6 cm. Back on 10/20/2013 this nodule measured 0.8 by 0.5 cm by my  measurement. There is significantly increased peripheral airspace opacity in the left lower lobe with enlargement of the cavitary lesion at that site, the total process measures 5.2 by 3.0 cm on image 97/4, and previously measured 2.7 by 1.5 cm on 02/12/2017. Airway thickening is present, suggesting bronchitis or reactive airways disease. Upper Abdomen: Unremarkable Musculoskeletal: Mild thoracic spondylosis. IMPRESSION: 1. Chronic cavitary left upper lobe lesion has demonstrated slow evolution in appearance over the last 3 years. This lesion has wall irregularity of variable thickness probably representing chronic smoldering infection, fungal infection is not excluded. On today's exam there is some increase in airspace opacity along the inferior margin of the lesion. 2. There is also increase in airspace opacity in the left lower lobe surrounding a cavitary lesion or bulla, progressive compared to 02/12/2017. 3.  Emphysema (ICD10-J43.9). 4. Chronic interstitial accentuation at the lung bases, right greater than left. 5. Other imaging findings of potential clinical significance: Aortic Atherosclerosis (ICD10-I70.0). Coronary atherosclerosis. Airway thickening is present, suggesting bronchitis or reactive airways disease. Small type 1 hiatal hernia. Electronically Signed   By: Van Clines M.D.   On: 09/02/2017 09:02   Dg Chest  Port 1 View  Result Date: 09/15/2017 CLINICAL DATA:  Shortness of breath. EXAM: PORTABLE CHEST 1 VIEW COMPARISON:  Chest CT 09/01/2017 FINDINGS: Advanced emphysema and hyperinflation. Peripheral left lower lobe cavitary lesion with surrounding soft tissue density, better assessed on prior MRI. The left upper lobe cavitary lesion on prior CT is only faintly visualized radiographically. Normal heart size. Unchanged mediastinal contours. Chronic bronchial thickening. No pulmonary edema, pleural effusion or pneumothorax. IMPRESSION: 1. Advanced emphysema with cavitary left lung lesions,  assessed on recent CT. 2. No evidence of acute abnormality. Electronically Signed   By: Jeb Levering M.D.   On: 09/15/2017 05:34    Lab Results: Basic Metabolic Panel: Recent Labs    09/22/17 0444 09/22/17 1221  GLUCOSE  --  436*  CREATININE 1.03  --    Liver Function Tests: No results for input(s): AST, ALT, ALKPHOS, BILITOT, PROT, ALBUMIN in the last 72 hours.   CBC: No results for input(s): WBC, NEUTROABS, HGB, HCT, MCV, PLT in the last 72 hours.  Recent Results (from the past 240 hour(s))  Respiratory Panel by PCR     Status: Abnormal   Collection Time: 09/15/17 10:15 AM  Result Value Ref Range Status   Adenovirus NOT DETECTED NOT DETECTED Final   Coronavirus 229E NOT DETECTED NOT DETECTED Final   Coronavirus HKU1 NOT DETECTED NOT DETECTED Final   Coronavirus NL63 NOT DETECTED NOT DETECTED Final   Coronavirus OC43 NOT DETECTED NOT DETECTED Final   Metapneumovirus NOT DETECTED NOT DETECTED Final   Rhinovirus / Enterovirus NOT DETECTED NOT DETECTED Final   Influenza A NOT DETECTED NOT DETECTED Final   Influenza B DETECTED (A) NOT DETECTED Final   Parainfluenza Virus 1 NOT DETECTED NOT DETECTED Final   Parainfluenza Virus 2 NOT DETECTED NOT DETECTED Final   Parainfluenza Virus 3 NOT DETECTED NOT DETECTED Final   Parainfluenza Virus 4 NOT DETECTED NOT DETECTED Final   Respiratory Syncytial Virus NOT DETECTED NOT DETECTED Final   Bordetella pertussis NOT DETECTED NOT DETECTED Final   Chlamydophila pneumoniae NOT DETECTED NOT DETECTED Final   Mycoplasma pneumoniae NOT DETECTED NOT DETECTED Final    Comment: Performed at [facility], [address] [place], [place] [address]  Culture, sputum-assessment     Status: None   Collection Time: 09/15/17  5:48 PM  Result Value Ref Range Status   Specimen Description EXPECTORATED SPUTUM  Final   Special Requests NONE  Final   Sputum evaluation   Final    THIS SPECIMEN IS ACCEPTABLE FOR SPUTUM CULTURE Performed at [facility], [address] [place], [place] [address]    Report Status 09/16/2017 FINAL  Final  Culture, respiratory (NON-Expectorated)     Status: None   Collection Time: 09/15/17  5:48 PM  Result Value Ref Range Status   Specimen Description   Final    EXPECTORATED SPUTUM Performed at [facility], [address], [place], [place] [address]    Special Requests   Final    NONE Reflexed from [phone] Performed at [facility], [address] [place], [place] [address]    Gram Stain   Final    RARE WBC PRESENT, PREDOMINANTLY PMN FEW YEAST Performed at [facility], [place] [address] [place], [place] [address]    Culture ABUNDANT CANDIDA [place]  Final   Report Status 09/19/2017 FINAL  Final  Acid Fast Smear (AFB)     Status: None   Collection Time: 09/19/17  6:00 AM  Result Value  Ref Range Status   AFB Specimen Processing Concentration  Final   Acid Fast Smear Negative  Final    Comment: (NOTE) Performed At: [facility] [place], [place] [id number] Rush Farmer MD RD:[id number]    Source (AFB) SPU  Final    Comment: Performed at [facility], [address] [place], [place] [address]     Hospital Course: This is a 57 year old who has a long known history of severe COPD with cavitary lesions of the lung.  He had Mycobacterium avium infection and has been on antibiotics for that.  He developed problems with his vision and the ethambutol was discontinued.  He did however continue on rifampin which was discontinued during this hospitalization to try to avoid antibiotic resistance.  He had been seen in my office about a week prior to admission and had a CT that showed his cavitary lesions were still present and he had what looked like more of an infiltrate around both of the cavities and he was started on antibiotic for presumed community-acquired pneumonia.  However he continued to have increasing shortness of breath cough and congestion and came to the emergency  department.  He was found to be hypoxic was found to have influenza B.  He was started on treatment for influenza.  He did not respond initially to treatment and was started on broad-spectrum intravenous antibiotics because of the cavitary areas.  He was also started on intravenous steroids.  He is known to have diabetes and his blood sugar went up substantially on the IV steroids.  He improved markedly over the next several days to the point that his lungs were clear although he was still coughing up sputum.  He was transitioned to oral prednisone and oral antibiotics and his blood sugar improved but was still elevated.  He had AFB smear during this hospitalization that was negative and culture is pending.  He continues to complain of pain in his back which is a chronic problem.  He continues to have some anxiety which is a chronic problem.  He has had paroxysmal atrial fibrillation but no episodes of atrial fib this hospitalization.  He is at maximum hospital benefit but will need close follow-up with home health services and close follow-up in my office.  Discharge Exam: Blood pressure 115/67, pulse 79, temperature 97.8 F (36.6 C), temperature source Oral, resp. rate 18, height 5\' 8"  (1.727 m), weight 63.5 kg (140 lb), SpO2 93 %. He is awake and alert.  His chest is clear with no wheezing or rhonchi.  His heart is regular with normal heart sounds.  Disposition: Home with home health services and support from [place] than 30 minutes were spent on this discharge  Discharge Instructions    AMB Referral to [facility] Management   Complete by:  As directed    Please assign patient for community nurse to engage for transition of care calls and evaluate for monthly home visits. Please also assign [facility] Pharmacist for patient's expressed need with medication affordability. For questions please contact:   Janci Minor RN, [facility] Liaison [phone])   Reason for  consult:  Post hospital discharge follow up with [place] and [facility] Pharmacist.   Diagnoses of:   COPD/ Pneumonia Diabetes     Expected date of contact:  1-3 days (reserved for hospital discharges)   Discharge patient   Complete by:  As directed    Discharge disposition:  01-Home or Self Care   Discharge patient date:  09/23/2017   Face-to-face encounter (required for Medicare/Medicaid patients)   Complete by:  As directed    I Shamyia Grandpre L certify that this patient is under my care and that I, or a nurse practitioner or physician's assistant working with me, had a face-to-face encounter that meets the physician face-to-face encounter requirements with this patient on 09/23/2017. The encounter with the patient was in whole, or in part for the following medical condition(s) which is the primary reason for home health care (List medical condition): COPD exacerbation/pneumonia   The encounter with the patient was in whole, or in part, for the following medical condition, which is the primary reason for home health care:  COPD exacerbation/pneumonia   I certify that, based on my findings, the following services are medically necessary home health services:  Nursing   Reason for Medically Necessary Home Health Services:  Skilled Nursing- Change/Decline in Patient Status   My clinical findings support the need for the above services:  Shortness of breath with activity   Further, I certify that my clinical findings support that this patient is homebound due to:  Shortness of Breath with activity   Home Health   Complete by:  As directed    To provide the following care/treatments:  RN   Please check his lungs.  Check on his use of insulin.  Help with diabetic instructions        Signed: Oniyah Rohe L   09/23/2017, 8:35 AM

## 2017-09-23 NOTE — Progress Notes (Signed)
Patient states understanding of discharge instructions.  

## 2017-09-23 NOTE — Care Management Important Message (Signed)
Important Message  Patient Details  Name: AMARIE TARTE MRN: [id number] Date of Birth: [birth year]   Medicare Important Message Given:  Yes    Sherald Barge, RN 09/23/2017, 9:13 AM

## 2017-09-23 NOTE — Progress Notes (Signed)
Subjective: He feels better.  He is still congested and he is coughing up some sputum today but his lungs are very clear and he is been able to get up and move around.  His blood sugar is better but still not totally controlled.  He has been able to give himself insulin injections in the past so I think he can do that again at least short-term.  Objective: Vital signs in last 24 hours: Temp:  [97.5 F (36.4 C)-97.8 F (36.6 C)] 97.8 F (36.6 C) (03/13 0643) Pulse Rate:  [73-90] 79 (03/13 0643) Resp:  [18] 18 (03/12 1501) BP: (115-122)/(64-67) 115/67 (03/13 0643) SpO2:  [91 %-99 %] 93 % (03/13 0803) Weight change:  Last BM Date: 09/22/17  Intake/Output from previous day: 03/12 0701 - 03/13 0700 In: 960 [P.O.:960] Out: 2475 [Urine:2475]  PHYSICAL EXAM General appearance: alert, cooperative and no distress Resp: clear to auscultation bilaterally Cardio: regular rate and rhythm, S1, S2 normal, no murmur, click, rub or gallop GI: soft, non-tender; bowel sounds normal; no masses,  no organomegaly Extremities: extremities normal, atraumatic, no cyanosis or edema  Lab Results:  Results for orders placed or performed during the hospital encounter of 09/15/17 (from the past 48 hour(s))  Glucose, capillary     Status: Abnormal   Collection Time: 09/21/17 11:26 AM  Result Value Ref Range   Glucose-Capillary 398 (H) 65 - 99 mg/dL   Comment 1 Notify RN    Comment 2 Document in Chart   Glucose, capillary     Status: Abnormal   Collection Time: 09/21/17  4:50 PM  Result Value Ref Range   Glucose-Capillary 199 (H) 65 - 99 mg/dL   Comment 1 Notify RN    Comment 2 Document in Chart   Glucose, capillary     Status: Abnormal   Collection Time: 09/21/17  8:27 PM  Result Value Ref Range   Glucose-Capillary 313 (H) 65 - 99 mg/dL   Comment 1 Notify RN    Comment 2 Document in Chart   Creatinine, serum     Status: None   Collection Time: 09/22/17  4:44 AM  Result Value Ref Range    Creatinine, Ser 1.03 0.61 - 1.24 mg/dL   GFR calc non Af Amer >60 >60 mL/min   GFR calc Af Amer >60 >60 mL/min    Comment: (NOTE) The eGFR has been calculated using the CKD EPI equation. This calculation has not been validated in all clinical situations. eGFR's persistently <60 mL/min signify possible Chronic Kidney Disease. Performed at [facility], [address], [place], [place] [address]   Glucose, capillary     Status: Abnormal   Collection Time: 09/22/17  8:10 AM  Result Value Ref Range   Glucose-Capillary 323 (H) 65 - 99 mg/dL  Glucose, capillary     Status: Abnormal   Collection Time: 09/22/17 11:31 AM  Result Value Ref Range   Glucose-Capillary 507 (HH) 65 - 99 mg/dL  Glucose, random     Status: Abnormal   Collection Time: 09/22/17 12:21 PM  Result Value Ref Range   Glucose, Bld 436 (H) 65 - 99 mg/dL    Comment: Performed at [facility], [address] [place], [place] [address]  Glucose, capillary     Status: Abnormal   Collection Time: 09/22/17  3:35 PM  Result Value Ref Range   Glucose-Capillary 284 (H) 65 - 99 mg/dL  Glucose, capillary     Status: Abnormal   Collection Time: 09/22/17  4:17 PM  Result Value Ref Range   Glucose-Capillary 226 (H) 65 - 99 mg/dL  Glucose, capillary     Status: Abnormal   Collection Time: 09/22/17  9:42 PM  Result Value Ref Range   Glucose-Capillary 364 (H) 65 - 99 mg/dL   Comment 1 Notify RN    Comment 2 Document in Chart   Glucose, capillary     Status: Abnormal   Collection Time: 09/23/17  7:53 AM  Result Value Ref Range   Glucose-Capillary 269 (H) 65 - 99 mg/dL   Comment 1 Notify RN    Comment 2 Document in Chart     ABGS No results for input(s): PHART, PO2ART, TCO2, HCO3 in the last 72 hours.  Invalid input(s): PCO2 CULTURES Recent Results (from the past 240 hour(s))  Respiratory Panel by PCR     Status: Abnormal   Collection Time: 09/15/17 10:15 AM  Result Value Ref Range Status   Adenovirus NOT DETECTED NOT  DETECTED Final   Coronavirus 229E NOT DETECTED NOT DETECTED Final   Coronavirus HKU1 NOT DETECTED NOT DETECTED Final   Coronavirus NL63 NOT DETECTED NOT DETECTED Final   Coronavirus OC43 NOT DETECTED NOT DETECTED Final   Metapneumovirus NOT DETECTED NOT DETECTED Final   Rhinovirus / Enterovirus NOT DETECTED NOT DETECTED Final   Influenza A NOT DETECTED NOT DETECTED Final   Influenza B DETECTED (A) NOT DETECTED Final   Parainfluenza Virus 1 NOT DETECTED NOT DETECTED Final   Parainfluenza Virus 2 NOT DETECTED NOT DETECTED Final   Parainfluenza Virus 3 NOT DETECTED NOT DETECTED Final   Parainfluenza Virus 4 NOT DETECTED NOT DETECTED Final   Respiratory Syncytial Virus NOT DETECTED NOT DETECTED Final   Bordetella pertussis NOT DETECTED NOT DETECTED Final   Chlamydophila pneumoniae NOT DETECTED NOT DETECTED Final   Mycoplasma pneumoniae NOT DETECTED NOT DETECTED Final    Comment: Performed at [facility], [address], [name], [place] [address]  Culture, sputum-assessment     Status: None   Collection Time: 09/15/17  5:48 PM  Result Value Ref Range Status   Specimen Description EXPECTORATED SPUTUM  Final   Special Requests NONE  Final   Sputum evaluation   Final    THIS SPECIMEN IS ACCEPTABLE FOR SPUTUM CULTURE Performed at [facility], [address] [place], [place] [address]    Report Status 09/16/2017 FINAL  Final  Culture, respiratory (NON-Expectorated)     Status: None   Collection Time: 09/15/17  5:48 PM  Result Value Ref Range Status   Specimen Description   Final    EXPECTORATED SPUTUM Performed at [facility], [address], [place], [place] [address]    Special Requests   Final    NONE Reflexed from [id number] Performed at [facility], [address], [place], [place] [address]    Gram Stain   Final    RARE WBC PRESENT, PREDOMINANTLY PMN FEW YEAST Performed at [facility], [place] [address] [place], [place] [address]    Culture ABUNDANT CANDIDA [facility]   Final   Report Status 09/19/2017 FINAL  Final  Acid Fast Smear (AFB)     Status: None   Collection Time: 09/19/17  6:00 AM  Result Value Ref Range Status   AFB Specimen Processing Concentration  Final   Acid Fast Smear Negative  Final    Comment: (NOTE) Performed At: [facility] [address] [place], [place] [id number] Rush Farmer MD DG:[id number]    Source (AFB) SPU  Final  Comment: Performed at [facility], [address] [place], [place] [address]   Studies/Results: No results found.  Medications:  Prior to Admission:  Medications Prior to Admission  Medication Sig Dispense Refill Last Dose  . albuterol (VENTOLIN HFA) 108 (90 BASE) MCG/ACT inhaler Inhale 2 puffs into the lungs every 6 (six) hours as needed for wheezing or shortness of breath.   07/03/2017 at Unknown time  . amoxicillin-clavulanate (AUGMENTIN) 875-125 MG tablet Take 1 tablet by mouth 2 (two) times daily.   09/14/2017 at 1200  . baclofen (LIORESAL) 20 MG tablet Take 20 mg by mouth 3 (three) times daily.    09/14/2017 at Unknown time  . benzonatate (TESSALON) 200 MG capsule Take 200 mg by mouth 3 (three) times daily.   09/14/2017 at Unknown time  . CARTIA XT 120 MG 24 hr capsule Take 120 mg by mouth daily.    09/14/2017 at 1200  . fluticasone (FLONASE) 50 MCG/ACT nasal spray Place 2 sprays into both nostrils daily as needed for allergies.    07/03/2017 at Unknown time  . hydrOXYzine (VISTARIL) 25 MG capsule Take 25 mg by mouth every 6 (six) hours as needed for itching.    07/03/2017 at Unknown time  . levalbuterol (XOPENEX) 0.63 MG/3ML nebulizer solution Take 3 mLs (0.63 mg total) by nebulization every 8 (eight) hours as needed for wheezing or shortness of breath. 3 mL 12 09/14/2017 at 1200  . LORazepam (ATIVAN) 0.5 MG tablet Take 1 tablet (0.5 mg total) by mouth 2 (two) times daily as needed for anxiety or sleep. (Patient taking differently: Take 0.5 mg by mouth 4 (four) times daily as needed for anxiety or  sleep. ) 20 tablet 0 Past Week at Unknown time  . metFORMIN (GLUCOPHAGE) 500 MG tablet Take 1 tablet (500 mg total) by mouth daily with breakfast. 30 tablet 3 09/14/2017 at 1200  . montelukast (SINGULAIR) 10 MG tablet Take 10 mg by mouth at bedtime.   09/14/2017 at Unknown time  . nitroGLYCERIN (NITROSTAT) 0.4 MG SL tablet Place 1 tablet (0.4 mg total) under the tongue every 5 (five) minutes as needed for chest pain. 25 tablet 4   . omeprazole (PRILOSEC) 20 MG capsule Take 20 mg by mouth daily.   09/14/2017 at Unknown time  . oxyCODONE-acetaminophen (PERCOCET) 10-325 MG tablet Take 1 tablet by mouth every 4 (four) hours as needed for pain.   09/14/2017 at Unknown time  . rifampin (RIFADIN) 300 MG capsule Take 2 capsules (600 mg total) by mouth daily. 60 capsule 5 09/14/2017 at 1200  . triamcinolone cream (KENALOG) 0.1 % Apply 1 application topically daily as needed (for itching/irritation).    Past Month at Unknown time  . predniSONE (DELTASONE) 50 MG tablet Take one tablet daily for 4 days (Patient not taking: Reported on 09/15/2017) 4 tablet 0 Completed Course at Unknown time   Scheduled: . baclofen  20 mg Oral TID  . benzonatate  200 mg Oral TID  . cefUROXime  500 mg Oral BID WC  . diltiazem  120 mg Oral Daily  . enoxaparin (LOVENOX) injection  40 mg Subcutaneous Q24H  . fluticasone furoate-vilanterol  1 puff Inhalation Daily  . gabapentin  100 mg Oral QHS  . insulin aspart  0-15 Units Subcutaneous TID WC  . insulin aspart  0-5 Units Subcutaneous QHS  . insulin glargine  12 Units Subcutaneous Daily  . levalbuterol  0.63 mg Nebulization Q6H  . mouth rinse  15 mL Mouth Rinse BID  .  metFORMIN  500 mg Oral Q breakfast  . montelukast  10 mg Oral QHS  . pantoprazole  40 mg Oral Daily  . predniSONE  40 mg Oral Q breakfast  . saccharomyces boulardii  250 mg Oral BID  . sodium chloride flush  3 mL Intravenous Q12H  . umeclidinium bromide  1 puff Inhalation Daily   Continuous: . sodium chloride      HSV:EXOGAC chloride, acetaminophen **OR** acetaminophen, fluticasone, HYDROcodone-homatropine, HYDROmorphone, hydrOXYzine, LORazepam, ondansetron **OR** ondansetron (ZOFRAN) IV, oxyCODONE-acetaminophen **AND** oxyCODONE, phenol, sodium chloride flush, triamcinolone cream  Assesment: He was admitted with acute hypoxic respiratory failure and COPD exacerbation.  I think he had community-acquired pneumonia and cavitary lesions of his lung.  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  AFB smear this admission was negative but culture is pending.  He has diabetes and his blood sugar was out of control at least mostly because of his steroid use.  This is getting better  He had influenza B and has finished treatment  He has chronic back pain which is stable  He has anxiety and after we discussed the black box warning regarding the concomitant use of pain medications with anxiety meds and his last office visit he had taken himself off of his lorazepam.  However he is not tolerating that well and I would like him to take the lorazepam just minimize the use at this point  He has had episodes of atrial fib and that is stable now  He has visual defect related to the use of ethambutol Principal Problem:   Acute respiratory failure with hypoxia ([place]) Active Problems:   Cavitary lesion of lung   Diabetes ([place])   COPD exacerbation ([place])   COPD (chronic obstructive pulmonary disease) ([place])   Respiratory failure ([place])   Influenza B    Plan: He is ready for discharge.  He is going to have home health services and have help from [facility]    LOS: 7 days   Aniela Caniglia L 09/23/2017, 8:27 AM

## 2017-09-23 NOTE — Patient Outreach (Signed)
[facility]) Care Management  09/23/2017  Ryan [name] [birth year] [id number]   Transition of care week 1  [facility] CM received a referral from J Minor for [name] on 09/22/17 and he discharged from [facility] on 09/23/17 [facility] with [facility] and his wife He confirms he received a call from "someone today about four o eight but my phone went dead.   CM was notified by Bryn Gulling that she received a call from [place] of [facility] informing her that a [place] patient called to refuse services   Plans: Cm to follow up with [name] this week  Cm left a voice message for [place] at the [place] [facility] office [phone] to inform Gwynne Edinger that if [name] is the patient she left a message with j farmer, he did not refuse services. [name] and his wife would like to have services Cm left CM mobile number for a return call  [place]. Lavina Hamman, RN, BSN, [place] Coordinator [phone] week day mobile

## 2017-09-24 ENCOUNTER — Other Ambulatory Visit: Payer: Self-pay | Admitting: Pharmacist

## 2017-09-24 DIAGNOSIS — A310 Pulmonary mycobacterial infection: Secondary | ICD-10-CM | POA: Diagnosis not present

## 2017-09-24 DIAGNOSIS — J439 Emphysema, unspecified: Secondary | ICD-10-CM | POA: Diagnosis not present

## 2017-09-24 DIAGNOSIS — J984 Other disorders of lung: Secondary | ICD-10-CM | POA: Diagnosis not present

## 2017-09-24 DIAGNOSIS — Z9981 Dependence on supplemental oxygen: Secondary | ICD-10-CM | POA: Diagnosis not present

## 2017-09-24 DIAGNOSIS — E119 Type 2 diabetes mellitus without complications: Secondary | ICD-10-CM | POA: Diagnosis not present

## 2017-09-24 DIAGNOSIS — J9621 Acute and chronic respiratory failure with hypoxia: Secondary | ICD-10-CM | POA: Diagnosis not present

## 2017-09-24 DIAGNOSIS — I480 Paroxysmal atrial fibrillation: Secondary | ICD-10-CM | POA: Diagnosis not present

## 2017-09-24 DIAGNOSIS — Z794 Long term (current) use of insulin: Secondary | ICD-10-CM | POA: Diagnosis not present

## 2017-09-24 NOTE — Patient Outreach (Signed)
[place]) Care Management  09/24/2017  Ryan [name] July 07, 1961 [id number]  Patient was referred to [place] Pharmacist by [facility] for medication assistance evaluation.   Successful phone outreach to patient---HIPAA details verified, and purpose of call explained to patient.    He reports concerns paying for insulin and his new inhaler, Trelegy.  He reports he has [facility] Medicare and sees Dr Susann Givens.   Reviewed preferred drug list for his plan---appears Trelegy, Lantus, and Humalog are all Tier 3.    Discussed Social Security Extra Help with patient---he reports his household income and assets may meet requirements---he was encouraged to apply---reports he prefers to go to [employer] office.   Discussed Engineer, production patient assistance for insulin will require him to be denied Social Security Extra Help, and meet additional requirements to apply.   He reports he has not contacted his PCP to inquire about samples.   Offered to review medications and patient declined.    Placed a call to Dr Luan Pulling to inquire about samples and left a message.    Called patient to report a message was left with his PCP regarding any samples.  He states he or his wife were planning to go to PCP office to ask.   Received call from [place], with PCP who states if they have any samples they will let patient know.   Plan:  Follow-up call to patient in next 2 weeks to see if he completed Social Security Extra Help application.    Will also see if patient is interested in medication review at that call.    Note faxed to PCP.    Karrie Meres, PharmD, [place] [phone]

## 2017-09-29 ENCOUNTER — Other Ambulatory Visit: Payer: Self-pay | Admitting: *Deleted

## 2017-09-29 ENCOUNTER — Other Ambulatory Visit: Payer: Self-pay

## 2017-09-29 DIAGNOSIS — I480 Paroxysmal atrial fibrillation: Secondary | ICD-10-CM | POA: Diagnosis not present

## 2017-09-29 DIAGNOSIS — J9611 Chronic respiratory failure with hypoxia: Secondary | ICD-10-CM | POA: Diagnosis not present

## 2017-09-29 DIAGNOSIS — J984 Other disorders of lung: Secondary | ICD-10-CM | POA: Diagnosis not present

## 2017-09-29 DIAGNOSIS — J439 Emphysema, unspecified: Secondary | ICD-10-CM | POA: Diagnosis not present

## 2017-09-29 DIAGNOSIS — I482 Chronic atrial fibrillation: Secondary | ICD-10-CM | POA: Diagnosis not present

## 2017-09-29 DIAGNOSIS — E1165 Type 2 diabetes mellitus with hyperglycemia: Secondary | ICD-10-CM | POA: Diagnosis not present

## 2017-09-29 DIAGNOSIS — Z794 Long term (current) use of insulin: Secondary | ICD-10-CM | POA: Diagnosis not present

## 2017-09-29 DIAGNOSIS — J9621 Acute and chronic respiratory failure with hypoxia: Secondary | ICD-10-CM | POA: Diagnosis not present

## 2017-09-29 DIAGNOSIS — A310 Pulmonary mycobacterial infection: Secondary | ICD-10-CM | POA: Diagnosis not present

## 2017-09-29 DIAGNOSIS — Z9981 Dependence on supplemental oxygen: Secondary | ICD-10-CM | POA: Diagnosis not present

## 2017-09-29 DIAGNOSIS — J441 Chronic obstructive pulmonary disease with (acute) exacerbation: Secondary | ICD-10-CM | POA: Diagnosis not present

## 2017-09-29 DIAGNOSIS — E119 Type 2 diabetes mellitus without complications: Secondary | ICD-10-CM | POA: Diagnosis not present

## 2017-09-29 NOTE — Patient Outreach (Signed)
[facility] [name]) Care Management   09/29/2017  Ryan [name] 29-Jun-1961 [id number]  Ryan [name] is an 57 y.o. male being followed by [facility] CM for transition of care services   Subjective: see notes below  Objective:   BP 112/64   Pulse (!) 115   Temp 97.6 F (36.4 C) (Oral)   Resp 20   SpO2 95%   Review of Systems  Constitutional: Positive for malaise/fatigue. Negative for chills, diaphoresis, fever and weight loss.  HENT: Negative.  Negative for congestion, ear discharge, ear pain, hearing loss, nosebleeds, sinus pain, sore throat and tinnitus.   Eyes: Positive for blurred vision and pain. Negative for double vision, photophobia, discharge and redness.  Respiratory: Positive for cough, sputum production, shortness of breath and wheezing. Negative for hemoptysis and stridor.        Does not know what color of sputum can not see it and wife has not observed sputum   Cardiovascular: Positive for orthopnea. Negative for chest pain, palpitations, claudication, leg swelling and PND.  Gastrointestinal: Positive for constipation. Negative for abdominal pain, blood in stool, diarrhea, heartburn, melena, nausea and vomiting.       Been 4-5 days since last stool  Genitourinary: Negative.  Negative for dysuria, flank pain, frequency, hematuria and urgency.  Musculoskeletal: Negative for back pain, falls, joint pain, myalgias and neck pain.  Skin: Negative for itching and rash.  Neurological: Positive for weakness. Negative for dizziness, tingling, tremors, sensory change, speech change, focal weakness, seizures, loss of consciousness and headaches.  Endo/Heme/Allergies: Negative.  Negative for environmental allergies and polydipsia. Does not bruise/bleed easily.  Psychiatric/Behavioral: Negative.  Negative for depression, hallucinations, memory loss, substance abuse and suicidal ideas. The patient is not nervous/anxious and does not have insomnia.     Physical Exam   Constitutional: He is oriented to person, place, and time. He appears well-developed and well-nourished.  HENT:  Head: Normocephalic and atraumatic.  Neck: Normal range of motion. Neck supple.  Respiratory: He has wheezes.  GI: Soft.  Musculoskeletal: He exhibits tenderness.  Neurological: He is alert and oriented to person, place, and time.  Skin: Skin is warm and dry.  Psychiatric: He has a normal mood and affect. His behavior is normal. Judgment and thought content normal.    Encounter Medications:   Outpatient Encounter Medications as of 09/29/2017  Medication Sig Note  . albuterol (VENTOLIN HFA) 108 (90 BASE) MCG/ACT inhaler Inhale 2 puffs into the lungs every 6 (six) hours as needed for wheezing or shortness of breath. 09/15/2017: Patient has if needed but has not had to take in the past month  . benzonatate (TESSALON) 200 MG capsule Take 200 mg by mouth 3 (three) times daily.   . cefUROXime (CEFTIN) 500 MG tablet Take 1 tablet (500 mg total) by mouth 2 (two) times daily with a meal.   . azithromycin (ZITHROMAX Z-PAK) 250 MG tablet Take by package instructions   . baclofen (LIORESAL) 20 MG tablet Take 20 mg by mouth 3 (three) times daily.    [name] CARTIA XT 120 MG 24 hr capsule Take 120 mg by mouth daily.    . fluticasone (FLONASE) 50 MCG/ACT nasal spray Place 2 sprays into both nostrils daily as needed for allergies.  09/15/2017: Patient has if needed but has not had to take in the past month  . Fluticasone-Umeclidin-Vilant (TRELEGY ELLIPTA) 100-62.5-25 MCG/INH AEPB Inhale 1 puff into the lungs daily. Rinse mouth after you use this   . hydrOXYzine (VISTARIL) 25  MG capsule Take 25 mg by mouth every 6 (six) hours as needed for itching.  09/15/2017: Patient has if needed but has not had to take in the past month  . insulin glargine (LANTUS) 100 UNIT/ML injection Inject 0.12 mLs (12 Units total) into the skin daily.   . insulin lispro (HUMALOG) 100 UNIT/ML injection Take by sliding scale provided  by [name] maximum amount daily is 60 units.  Check blood sugar 4 times a day   . levalbuterol (XOPENEX) 0.63 MG/3ML nebulizer solution Take 3 mLs (0.63 mg total) by nebulization every 8 (eight) hours as needed for wheezing or shortness of breath.   [name] LORazepam (ATIVAN) 0.5 MG tablet Take 1 tablet (0.5 mg total) by mouth 2 (two) times daily as needed for anxiety or sleep. (Patient taking differently: Take 0.5 mg by mouth 4 (four) times daily as needed for anxiety or sleep. )   . metFORMIN (GLUCOPHAGE) 500 MG tablet Take 1 tablet (500 mg total) by mouth daily with breakfast.   . montelukast (SINGULAIR) 10 MG tablet Take 10 mg by mouth at bedtime.   . nitroGLYCERIN (NITROSTAT) 0.4 MG SL tablet Place 1 tablet (0.4 mg total) under the tongue every 5 (five) minutes as needed for chest pain.   [name] omeprazole (PRILOSEC) 20 MG capsule Take 20 mg by mouth daily.   [name] oxyCODONE-acetaminophen (PERCOCET) 10-325 MG tablet Take 1 tablet by mouth every 4 (four) hours as needed for pain.   . predniSONE (STERAPRED UNI-PAK 21 TAB) 10 MG (21) TBPK tablet Take by package instructions   . saccharomyces boulardii (FLORASTOR) 250 MG capsule Take 1 capsule (250 mg total) by mouth 2 (two) times daily.   [name] triamcinolone cream (KENALOG) 0.1 % Apply 1 application topically daily as needed (for itching/irritation).     No facility-administered encounter medications on file as of 09/29/2017.     Functional Status:   In your present state of health, do you have any difficulty performing the following activities: 09/15/2017  Hearing? N  Vision? Y  Difficulty concentrating or making decisions? N  Walking or climbing stairs? N  Dressing or bathing? N  Doing errands, shopping? N  Some recent data might be hidden    Fall/Depression Screening:    Fall Risk  09/03/2016  Falls in the past year? No   PHQ 2/9 Scores 09/03/2016  PHQ - 2 Score 0  PHQ- 9 Score 6    Assessment:    [facility] CM met with Ryan [name] and his wife for this  initial home visit. His son is present but preparing to go to work.  He is sitting at his kitchen table with his 6 L of oxygen intact per [place].  Respiratory. He is noted with wheezing and reports his is scheduled to follow up with Dr Luan Pulling today at 2 pm. O2 Sat has been down to 74%. Encouraged him to discuss these concerns with Dr Luan Pulling today. He informs CM he has been taking his breathing treatments.  He reports that two spots have been noted on his lungs that is being followed by Dr Luan Pulling, his pulmonologist and primary MD.    Home health - He receives home health on Tuesdays and Wednesday. His HHRN , Kathlee Nations arrived during this CM home visit  Medications reviewed Potassium, Albuterol, Augmentin, Lopressor, prednisone, Potassium cl 20 mEq 1-tab qd with/Lasix 1 40 mg prn per pt and Ativan 1 bid 0.5 mg. He reports that [name] pharmacy staff has called already to offer assist  for medication assistance programs to lower $300 home benzonatate 1 cap prn and  trelegy ($45) Cm called [facility] to correct his address. 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 He reports the highest cbg he has had in the last few months has been 400 and states he drank Dr Malachi Bonds on that occasion His a1c is listed at 7.  He says he does not follow a DM diet  Cm reviewed the importance of the ordered Low sodium heart healthy Dm diet   Plan:  [facility] CM will continue to follow Ryan [name] for transition of care services  [facility] CM stopped by Dr Luan Pulling office to discuss [name] respiratory concerns today      Joelene Millin L. Lavina Hamman, RN, BSN, [place] Coordinator Direct number [phone]  [facility] [name] number [phone] Fax number [phone]

## 2017-10-02 ENCOUNTER — Inpatient Hospital Stay (HOSPITAL_COMMUNITY)
Admission: EM | Admit: 2017-10-02 | Discharge: 2017-10-04 | DRG: 637 | Disposition: A | Discharge status: Home / Self Care | Payer: Medicare Other | Attending: Pulmonary Disease | Admitting: Pulmonary Disease

## 2017-10-02 ENCOUNTER — Other Ambulatory Visit: Payer: Self-pay

## 2017-10-02 ENCOUNTER — Other Ambulatory Visit: Payer: Self-pay | Admitting: Pharmacist

## 2017-10-02 DIAGNOSIS — I4891 Unspecified atrial fibrillation: Secondary | ICD-10-CM | POA: Diagnosis present

## 2017-10-02 DIAGNOSIS — A310 Pulmonary mycobacterial infection: Secondary | ICD-10-CM | POA: Diagnosis not present

## 2017-10-02 DIAGNOSIS — T380X5A Adverse effect of glucocorticoids and synthetic analogues, initial encounter: Secondary | ICD-10-CM | POA: Diagnosis not present

## 2017-10-02 DIAGNOSIS — Z833 Family history of diabetes mellitus: Secondary | ICD-10-CM

## 2017-10-02 DIAGNOSIS — J984 Other disorders of lung: Secondary | ICD-10-CM | POA: Diagnosis not present

## 2017-10-02 DIAGNOSIS — E1165 Type 2 diabetes mellitus with hyperglycemia: Secondary | ICD-10-CM | POA: Diagnosis not present

## 2017-10-02 DIAGNOSIS — R739 Hyperglycemia, unspecified: Secondary | ICD-10-CM | POA: Diagnosis not present

## 2017-10-02 DIAGNOSIS — I482 Chronic atrial fibrillation, unspecified: Secondary | ICD-10-CM | POA: Diagnosis present

## 2017-10-02 DIAGNOSIS — G40909 Epilepsy, unspecified, not intractable, without status epilepticus: Secondary | ICD-10-CM | POA: Diagnosis present

## 2017-10-02 DIAGNOSIS — F419 Anxiety disorder, unspecified: Secondary | ICD-10-CM | POA: Diagnosis present

## 2017-10-02 DIAGNOSIS — M549 Dorsalgia, unspecified: Secondary | ICD-10-CM | POA: Diagnosis present

## 2017-10-02 DIAGNOSIS — J8410 Pulmonary fibrosis, unspecified: Secondary | ICD-10-CM | POA: Diagnosis present

## 2017-10-02 DIAGNOSIS — J9611 Chronic respiratory failure with hypoxia: Secondary | ICD-10-CM | POA: Diagnosis present

## 2017-10-02 DIAGNOSIS — R627 Adult failure to thrive: Secondary | ICD-10-CM | POA: Diagnosis not present

## 2017-10-02 DIAGNOSIS — Z8249 Family history of ischemic heart disease and other diseases of the circulatory system: Secondary | ICD-10-CM

## 2017-10-02 DIAGNOSIS — I2510 Atherosclerotic heart disease of native coronary artery without angina pectoris: Secondary | ICD-10-CM | POA: Diagnosis not present

## 2017-10-02 DIAGNOSIS — G8929 Other chronic pain: Secondary | ICD-10-CM | POA: Diagnosis present

## 2017-10-02 DIAGNOSIS — E43 Unspecified severe protein-calorie malnutrition: Secondary | ICD-10-CM | POA: Diagnosis present

## 2017-10-02 DIAGNOSIS — K2270 Barrett's esophagus without dysplasia: Secondary | ICD-10-CM | POA: Diagnosis present

## 2017-10-02 DIAGNOSIS — J449 Chronic obstructive pulmonary disease, unspecified: Secondary | ICD-10-CM | POA: Diagnosis present

## 2017-10-02 DIAGNOSIS — Z794 Long term (current) use of insulin: Secondary | ICD-10-CM

## 2017-10-02 DIAGNOSIS — Z9981 Dependence on supplemental oxygen: Secondary | ICD-10-CM

## 2017-10-02 DIAGNOSIS — R0602 Shortness of breath: Secondary | ICD-10-CM | POA: Diagnosis not present

## 2017-10-02 DIAGNOSIS — E785 Hyperlipidemia, unspecified: Secondary | ICD-10-CM | POA: Diagnosis present

## 2017-10-02 DIAGNOSIS — E119 Type 2 diabetes mellitus without complications: Secondary | ICD-10-CM | POA: Diagnosis not present

## 2017-10-02 DIAGNOSIS — K219 Gastro-esophageal reflux disease without esophagitis: Secondary | ICD-10-CM | POA: Diagnosis present

## 2017-10-02 DIAGNOSIS — J439 Emphysema, unspecified: Secondary | ICD-10-CM | POA: Diagnosis not present

## 2017-10-02 DIAGNOSIS — Z6822 Body mass index (BMI) 22.0-22.9, adult: Secondary | ICD-10-CM | POA: Diagnosis not present

## 2017-10-02 DIAGNOSIS — I480 Paroxysmal atrial fibrillation: Secondary | ICD-10-CM | POA: Diagnosis present

## 2017-10-02 DIAGNOSIS — J9621 Acute and chronic respiratory failure with hypoxia: Secondary | ICD-10-CM | POA: Diagnosis not present

## 2017-10-02 DIAGNOSIS — Z87891 Personal history of nicotine dependence: Secondary | ICD-10-CM

## 2017-10-02 LAB — CBG MONITORING, ED
GLUCOSE-CAPILLARY: 508 mg/dL — AB (ref 65–99)
Glucose-Capillary: 565 mg/dL (ref 65–99)
Glucose-Capillary: 365 mg/dL — ABNORMAL HIGH (ref 65–99)

## 2017-10-02 LAB — BASIC METABOLIC PANEL
ANION GAP: 15 (ref 5–15)
BUN: 26 mg/dL — AB (ref 6–20)
CHLORIDE: 97 mmol/L — AB (ref 101–111)
CO2: 24 mmol/L (ref 22–32)
Calcium: 9 mg/dL (ref 8.9–10.3)
Creatinine, Ser: 1.17 mg/dL (ref 0.61–1.24)
GFR calc Af Amer: >60 mL/min (ref >60)
GFR calc non Af Amer: >60 mL/min (ref >60)
GLUCOSE: 626 mg/dL — AB (ref 65–99)
POTASSIUM: 4.9 mmol/L (ref 3.5–5.1)
Sodium: 136 mmol/L (ref 135–145)

## 2017-10-02 LAB — CBC
HEMATOCRIT: 36.3 % — AB (ref 39.0–52.0)
HEMOGLOBIN: 11.4 g/dL — AB (ref 13.0–17.0)
MCH: 27.4 pg (ref 26.0–34.0)
MCHC: 31.4 g/dL (ref 30.0–36.0)
MCV: 87.3 fL (ref 78.0–100.0)
Platelets: 216 10*3/uL (ref 150–400)
RBC: 4.16 MIL/uL — ABNORMAL LOW (ref 4.22–5.81)
RDW: 13.4 % (ref 11.5–15.5)
WBC: 9.4 10*3/uL (ref 4.0–10.5)

## 2017-10-02 LAB — URINALYSIS, ROUTINE W REFLEX MICROSCOPIC
BACTERIA UA: NONE SEEN
BILIRUBIN URINE: NEGATIVE
Glucose, UA: >=500 mg/dL — AB
Ketones, ur: NEGATIVE mg/dL
Leukocytes, UA: NEGATIVE
NITRITE: NEGATIVE
Protein, ur: NEGATIVE mg/dL
SQUAMOUS EPITHELIAL / LPF: NONE SEEN
Specific Gravity, Urine: 1.026 (ref 1.005–1.030)
pH: 5 (ref 5.0–8.0)

## 2017-10-02 MED ORDER — CEFUROXIME AXETIL 250 MG PO TABS
500.0000 mg | ORAL_TABLET | Freq: Two times a day (BID) | ORAL | Status: DC
  Administered 2017-10-03 – 2017-10-04 (×3): 500 mg via ORAL
  Filled 2017-10-02 (×3): qty 2

## 2017-10-02 MED ORDER — PANTOPRAZOLE SODIUM 40 MG PO TBEC
40.0000 mg | DELAYED_RELEASE_TABLET | Freq: Every day | ORAL | Status: DC
  Administered 2017-10-03 – 2017-10-04 (×2): 40 mg via ORAL
  Filled 2017-10-02 (×2): qty 1

## 2017-10-02 MED ORDER — LEVALBUTEROL HCL 0.63 MG/3ML IN NEBU
0.6300 mg | INHALATION_SOLUTION | Freq: Three times a day (TID) | RESPIRATORY_TRACT | Status: DC | PRN
  Administered 2017-10-03: 0.63 mg via RESPIRATORY_TRACT
  Filled 2017-10-02: qty 3

## 2017-10-02 MED ORDER — POLYETHYLENE GLYCOL 3350 17 G PO PACK: 17.0000 g | PACK | Freq: Every day | ORAL | Status: DC | PRN

## 2017-10-02 MED ORDER — NITROGLYCERIN 0.4 MG SL SUBL: 0.4000 mg | SUBLINGUAL_TABLET | SUBLINGUAL | Status: DC | PRN

## 2017-10-02 MED ORDER — SODIUM CHLORIDE 0.9 % IV SOLN: INTRAVENOUS | Status: DC

## 2017-10-02 MED ORDER — ENOXAPARIN SODIUM 40 MG/0.4ML ~~LOC~~ SOLN
40.0000 mg | SUBCUTANEOUS | Status: DC
  Administered 2017-10-02 – 2017-10-03 (×2): 40 mg via SUBCUTANEOUS
  Filled 2017-10-02 (×2): qty 0.4

## 2017-10-02 MED ORDER — DILTIAZEM HCL ER COATED BEADS 120 MG PO CP24
120.0000 mg | ORAL_CAPSULE | Freq: Every day | ORAL | Status: DC
  Administered 2017-10-03 – 2017-10-04 (×2): 120 mg via ORAL
  Filled 2017-10-02 (×2): qty 1

## 2017-10-02 MED ORDER — LORAZEPAM 0.5 MG PO TABS
0.5000 mg | ORAL_TABLET | Freq: Four times a day (QID) | ORAL | Status: DC | PRN
  Administered 2017-10-03 – 2017-10-04 (×3): 0.5 mg via ORAL
  Filled 2017-10-02 (×3): qty 1

## 2017-10-02 MED ORDER — SODIUM CHLORIDE 0.9 % IV BOLUS (SEPSIS)
500.0000 mL | Freq: Once | INTRAVENOUS | Status: AC
  Administered 2017-10-02: 500 mL via INTRAVENOUS

## 2017-10-02 MED ORDER — OXYCODONE-ACETAMINOPHEN 5-325 MG PO TABS
1.0000 | ORAL_TABLET | ORAL | Status: DC | PRN
  Administered 2017-10-02 – 2017-10-04 (×9): 1 via ORAL
  Filled 2017-10-02 (×9): qty 1

## 2017-10-02 MED ORDER — OXYCODONE-ACETAMINOPHEN 10-325 MG PO TABS: 1.0000 | ORAL_TABLET | ORAL | Status: DC | PRN

## 2017-10-02 MED ORDER — DEXTROSE-NACL 5-0.45 % IV SOLN: INTRAVENOUS | Status: DC

## 2017-10-02 MED ORDER — ONDANSETRON HCL 4 MG PO TABS: 4.0000 mg | ORAL_TABLET | Freq: Four times a day (QID) | ORAL | Status: DC | PRN

## 2017-10-02 MED ORDER — SODIUM CHLORIDE 0.9 % IV SOLN
INTRAVENOUS | Status: DC
  Administered 2017-10-02: 4.5 [IU]/h via INTRAVENOUS
  Administered 2017-10-02: 3.1 [IU]/h via INTRAVENOUS
  Filled 2017-10-02 (×2): qty 1

## 2017-10-02 MED ORDER — BENZONATATE 100 MG PO CAPS
200.0000 mg | ORAL_CAPSULE | Freq: Three times a day (TID) | ORAL | Status: DC
  Administered 2017-10-02 – 2017-10-04 (×5): 200 mg via ORAL
  Filled 2017-10-02 (×5): qty 2

## 2017-10-02 MED ORDER — ONDANSETRON HCL 4 MG/2ML IJ SOLN: 4.0000 mg | Freq: Four times a day (QID) | INTRAMUSCULAR | Status: DC | PRN

## 2017-10-02 MED ORDER — OXYCODONE HCL 5 MG PO TABS
5.0000 mg | ORAL_TABLET | ORAL | Status: DC | PRN
  Administered 2017-10-02 – 2017-10-04 (×9): 5 mg via ORAL
  Filled 2017-10-02 (×9): qty 1

## 2017-10-02 MED ORDER — FLUTICASONE PROPIONATE 50 MCG/ACT NA SUSP: 2.0000 | Freq: Every day | NASAL | Status: DC | PRN

## 2017-10-02 MED ORDER — MONTELUKAST SODIUM 10 MG PO TABS
10.0000 mg | ORAL_TABLET | Freq: Every day | ORAL | Status: DC
  Administered 2017-10-02 – 2017-10-03 (×2): 10 mg via ORAL
  Filled 2017-10-02 (×2): qty 1

## 2017-10-02 MED ORDER — ACETAMINOPHEN 650 MG RE SUPP: 650.0000 mg | Freq: Four times a day (QID) | RECTAL | Status: DC | PRN

## 2017-10-02 MED ORDER — SODIUM CHLORIDE 0.9 % IV SOLN
INTRAVENOUS | Status: DC
  Administered 2017-10-02 – 2017-10-04 (×3): via INTRAVENOUS

## 2017-10-02 MED ORDER — PREDNISONE 10 MG PO TABS
10.0000 mg | ORAL_TABLET | Freq: Every day | ORAL | Status: DC
  Administered 2017-10-03 – 2017-10-04 (×2): 10 mg via ORAL
  Filled 2017-10-02 (×2): qty 1

## 2017-10-02 MED ORDER — BACLOFEN 10 MG PO TABS
20.0000 mg | ORAL_TABLET | Freq: Three times a day (TID) | ORAL | Status: DC
  Administered 2017-10-02 – 2017-10-04 (×5): 20 mg via ORAL
  Filled 2017-10-02 (×5): qty 2

## 2017-10-02 MED ORDER — SODIUM CHLORIDE 0.9 % IV SOLN
INTRAVENOUS | Status: DC
  Administered 2017-10-02: 23:00:00 via INTRAVENOUS

## 2017-10-02 MED ORDER — ACETAMINOPHEN 325 MG PO TABS: 650.0000 mg | ORAL_TABLET | Freq: Four times a day (QID) | ORAL | Status: DC | PRN

## 2017-10-02 MED ORDER — DEXTROSE-NACL 5-0.45 % IV SOLN
INTRAVENOUS | Status: DC
  Administered 2017-10-03: 01:00:00 via INTRAVENOUS

## 2017-10-02 MED ORDER — FLUTICASONE FUROATE-VILANTEROL 100-25 MCG/INH IN AEPB
1.0000 | INHALATION_SPRAY | Freq: Every day | RESPIRATORY_TRACT | Status: DC
  Administered 2017-10-03 – 2017-10-04 (×2): 1 via RESPIRATORY_TRACT
  Filled 2017-10-02: qty 28
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
## 2017-10-02 NOTE — Patient Outreach (Signed)
[facility]) Care Management  10/02/2017  Kairon Shock Atha Mar 03, 1961 [id number]  Unsuccessful phone outreach to patient, HIPAA compliant message left requesting return call.   Purpose of call to follow-up if he went to [place] Office to complete Extra Help application (patient preferred to complete there versus online) and if patient would be willing to complete medication review.   Plan:  Will make second outreach attempt next week if no return call from patient.  Karrie Meres, PharmD, [place] [phone]

## 2017-10-02 NOTE — H&P (Signed)
History and Physical    Ryan [name] ELF:[id number] DOB: June 11, 1961 DOA: 10/02/2017  PCP: [name], MD   Patient coming from: Home   Chief Complaint: Hyperglycemia  HPI: Ryan [name] is a 57 y.o. 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  Patient was recently hospitalized for acute COPD exacerbation due to influenza B and was treated with IV steroids.  During that time he apparently was taken off all treatment for Mycobacterium avium complex due to concern for resistance with monotherapy.  He was quite hyperglycemic during that hospitalization he was discharged home on oral steroids.  He saw his PCP approximately 3 days ago and was noted to be getting somewhat worse and was given a dose of IV steroids and antibiotics.  His blood sugars have been ranging between 100-500s at home particularly during dinner.  He has been doing glargine 12 units at night and sliding scale insulin before meals.  He otherwise denies any fevers, nausea, vomiting, diarrhea.  He has chronic dyspnea and chronic sputum production however this is largely unchanged.  ED Course: In the ED no vitals were taken.  Labs were notable for hypergl ycemia with glucose of 626.  No acidosis was noted.  Patient was started on insulin drip and given some IV fluids.  Review of Systems: As per HPI otherwise 10 point review of systems negative.    Past Medical History:  Diagnosis Date  . Atrial fibrillation ([place])    Not anticoagulated  . Barrett's esophagus   . Borderline diabetes   . Bronchitis   . CAP (community acquired pneumonia) 07/10/2013   04/2015  . 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  Chronic back pain   . Chronic left shoulder pain   . Chronic neck pain   . Chronic respiratory failure ([place])   . Collagen vascular disease ([place])   . COPD (chronic obstructive pulmonary disease) ([place])   . Coronary atherosclerosis of native coronary artery    Mild nonobstructive disease at catheterization 2007  . DDD (degenerative disc disease)    Cervical and thoracic  . GERD (gastroesophageal reflux disease)   . History of pneumonia   . Hypercholesteremia   . Polycythemia   . Seizures ([place])    Last seizure 2 yrs ago  . Smoker   . Type 2 diabetes mellitus ([place])   . Type 2 diabetes mellitus without complication [facility])     Past Surgical History:  Procedure Laterality Date  . COLONOSCOPY  2012   Dr. Posey Pronto: normal  . ESOPHAGOGASTRODUODENOSCOPY N/A 05/04/2015   Dr. Michail Sermon: minimal erosive esophagitis, small hiatal hernia. FOOD PRECLUDED A COMPLETE EXAM. No biopsies taken  . ESOPHAGOGASTRODUODENOSCOPY  2011   [place]: Barrett's   . LUNG BIOPSY    . Throat biopsy    . VASECTOMY    . VASECTOMY  1987     reports that he has been smoking cigarettes.  He has a 22.50 pack-year smoking history. He has never used smokeless tobacco. He reports that he does not drink alcohol or use drugs.  Allergies  Allergen Reactions  . Albuterol Palpitations  . 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 Influenza  Vaccine Live Swelling    Family History  Problem Relation Age of Onset  . Hypertension Mother   . Diabetes Mother   . Heart attack Mother   . Heart failure Mother   . Hypertension Sister   . Diabetes Sister   . Heart failure Sister   . Colon cancer Neg Hx    Unacceptable: Noncontributory, unremarkable, or negative. Acceptable: Family history reviewed and not pertinent (If you reviewed it)  Prior to Admission medications   Medication Sig Start Date End Date Taking? Authorizing Provider  albuterol (VENTOLIN HFA)  108 (90 BASE) MCG/ACT inhaler Inhale 2 puffs into the lungs every 6 (six) hours as needed for wheezing or shortness of breath.    [provider]  azithromycin (ZITHROMAX Z-PAK) 250 MG tablet Take by package instructions 09/23/17   [name], MD  baclofen (LIORESAL) 20 MG tablet Take 20 mg by mouth 3 (three) times daily.  06/05/15   [provider]  benzonatate (TESSALON) 200 MG capsule Take 200 mg by mouth 3 (three) times daily.    [provider]  CARTIA XT 120 MG 24 hr capsule Take 120 mg by mouth daily.  10/09/16   [provider]  cefUROXime (CEFTIN) 500 MG tablet Take 1 tablet (500 mg total) by mouth 2 (two) times daily with a meal. 09/23/17   [name], MD  fluticasone [facility]) 50 MCG/ACT nasal spray Place 2 sprays into both nostrils daily as needed for allergies.     [provider]  Fluticasone-Umeclidin-Vilant (TRELEGY ELLIPTA) 100-62.5-25 MCG/INH AEPB Inhale 1 puff into the lungs daily. Rinse mouth after you use this 09/23/17   [name], MD  hydrOXYzine (VISTARIL) 25 MG capsule Take 25 mg by mouth every 6 (six) hours as needed for itching.  12/05/16   [provider]  insulin glargine (LANTUS) 100 UNIT/ML injection Inject 0.12 mLs (12 Units total) into the skin daily. 09/23/17   [name], MD  insulin lispro (HUMALOG) 100 UNIT/ML injection Take by sliding scale provided by hospital maximum amount daily is 60 units.  Check blood sugar 4 times a day 09/23/17   [name], MD  levalbuterol Penne Lash) 0.63 MG/3ML nebulizer solution Take 3 mLs (0.63 mg total) by nebulization every 8 (eight) hours as needed for wheezing or shortness of breath. 09/05/15   [name], MD  LORazepam (ATIVAN) 0.5 MG tablet Take 1 tablet (0.5 mg total) by mouth 2 (two) times daily as needed for anxiety or sleep. Patient taking differently: Take 0.5 mg by mouth 4 (four) times daily as needed for anxiety or sleep.  05/04/15   Cristal Ford, DO  metFORMIN (GLUCOPHAGE) 500 MG tablet Take 1 tablet (500 mg total) by mouth daily with breakfast. 09/15/14   Rexene Alberts, MD  montelukast (SINGULAIR) 10 MG tablet Take 10 mg by mouth at bedtime.    [provider]  nitroGLYCERIN (NITROSTAT) 0.4 MG SL tablet Place 1 tablet (0.4 mg total) under the tongue every 5 (five) minutes as needed for chest pain. 04/22/13   Lendon Colonel, NP  omeprazole (PRILOSEC) 20 MG capsule Take 20 mg by mouth daily.    [provider]  oxyCODONE-acetaminophen (PERCOCET) 10-325 MG tablet Take 1 tablet by mouth every 4 (four) hours as needed for pain.    [provider]  predniSONE (STERAPRED UNI-PAK 21 TAB) 10 MG (21) TBPK tablet Take by package instructions 09/23/17   [name], MD  saccharomyces boulardii (FLORASTOR) 250 MG capsule Take 1 capsule (  250 mg total) by mouth 2 (two) times daily. 09/23/17   [name], MD  triamcinolone cream (KENALOG) 0.1 % Apply 1 application topically daily as needed (for itching/irritation).  09/29/16   [provider]    Physical Exam: There were no vitals filed for this visit.  Constitutional: Chronically ill-appearing There were no vitals filed for this visit. Eyes: Anicteric sclera ENMT: Dry mucous membranes, poor dentition Neck: normal, supple Respiratory: Mildly increased work of breathing, diffuse wheezing throughout particularly worse on left, minimal if any breath sounds, vesicular breath sounds in upper lobes Cardiovascular: Regular rate, rhythm, no murmurs Abdomen: no tenderness, no masses palpated. No hepatosplenomegaly. Bowel sounds positive.  Musculoskeletal: Trace lower extremity edema Skin: Multiple ecchymoses all over visible skin Neurologic: Grossly intact, moving all extremities.  Psychiatric: Normal judgment and insight. Alert and oriented x 3. Normal mood.   (Anything < 9 systems with 2 bullets each down codes to level 1) (If patient refuses exam  can't bill higher level) (Make sure to document decubitus ulcers present on admission -- if possible -- and whether patient has chronic indwelling catheter at time of admission)  Labs on Admission: I have personally reviewed following labs and imaging studies  CBC: Recent Labs  Lab 10/02/17 2006  WBC 9.4  HGB 11.4*  HCT 36.3*  MCV 87.3  PLT 161   Basic Metabolic Panel: Recent Labs  Lab 10/02/17 2006  NA 136  K 4.9  CL 97*  CO2 24  GLUCOSE 626*  BUN 26*  CREATININE 1.17  CALCIUM 9.0   GFR: CrCl cannot be calculated (Unknown ideal weight.). Liver Function Tests: No results for input(s): AST, ALT, ALKPHOS, BILITOT, PROT, ALBUMIN in the last 168 hours. No results for input(s): LIPASE, AMYLASE in the last 168 hours. No results for input(s): AMMONIA in the last 168 hours. Coagulation Profile: No results for input(s): INR, PROTIME in the last 168 hours. Cardiac Enzymes: No results for input(s): CKTOTAL, CKMB, CKMBINDEX, TROPONINI in the last 168 hours. BNP (last 3 results) No results for input(s): PROBNP in the last 8760 hours. HbA1C: No results for input(s): HGBA1C in the last 72 hours. CBG: Recent Labs  Lab 10/02/17 1931  GLUCAP 565*   Lipid Profile: No results for input(s): CHOL, HDL, LDLCALC, TRIG, CHOLHDL, LDLDIRECT in the last 72 hours. Thyroid Function Tests: No results for input(s): TSH, T4TOTAL, FREET4, T3FREE, THYROIDAB in the last 72 hours. Anemia Panel: No results for input(s): VITAMINB12, FOLATE, FERRITIN, TIBC, IRON, RETICCTPCT in the last 72 hours. Urine analysis:    Component Value Date/Time   COLORURINE STRAW (A) 10/02/2017 2006   APPEARANCEUR CLEAR 10/02/2017 2006   LABSPEC 1.026 10/02/2017 2006   PHURINE 5.0 10/02/2017 2006   GLUCOSEU >=500 (A) 10/02/2017 2006   HGBUR SMALL (A) 10/02/2017 2006   BILIRUBINUR NEGATIVE 10/02/2017 2006   KETONESUR NEGATIVE 10/02/2017 2006   PROTEINUR NEGATIVE 10/02/2017 2006   UROBILINOGEN 0.2 09/08/2014 1444     NITRITE NEGATIVE 10/02/2017 2006   LEUKOCYTESUR NEGATIVE 10/02/2017 2006    Radiological Exams on Admission: No results found.  EKG: Independently reviewed.  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  The emergency department started the patient on insulin drip. -Continue insulin drip, admit to ICU/stepdown -Continue IV fluids, transition to subcu 12 units glargine once blood sugar is less than 250 - Would suggest starting patient on mealtime insulin scheduled if he is to be on such chronic steroids  -carb restricted diet -Hold metformin  #) Gold stage III COPD: He is currently on 6 L.  His respiratory failure is fairly dramatic.  Even on exam he appears to chronically be wheezing.  It increasingly seems like he would be a good candidate for discussion of hospice particularly as it is unlikely that he will merit any further advanced treatment for his very severe COPD. 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  Unfortunately it appears he is not tolerated the treatment regimen for this infection  #) Psych/pain: -Continue lorazepam 0.5 mg 4 times daily as needed  Fluids:IV fluids with IV insulin protocol Electrolytes monitor and supplement Nutrition: Carb restricted diet  Prophylaxis: Inox apparent  Disposition: Pending stabilization of blood sugars  Full code   Cristy Folks MD [facility]   If 7PM-7AM, please contact night-coverage [email or web address] Password [facility]  10/02/2017, 9:02 PM

## 2017-10-02 NOTE — ED Triage Notes (Addendum)
Per pt's wife, the pt's BS was 529 and the wife gave the pt was gvien 12units of Lantus. Pt ans family unaware of any sliding scale for high blood sugars. Pt has been on steroids x 3 weeks. Pt received a steroid on Tuesday of this week.

## 2017-10-02 NOTE — ED Notes (Signed)
Date and time results received: 10/02/17 2046 (use smartphrase ".now" to insert current time)  Test: glucose Critical Value: 626  Name of Provider Notified: Dr. Rogene Houston at 2052  Orders Received? Or Actions Taken?: yes; glucostabilizer ordered

## 2017-10-02 NOTE — ED Notes (Signed)
Patient asking for pain medication at this time.

## 2017-10-03 ENCOUNTER — Encounter (HOSPITAL_COMMUNITY): Payer: Self-pay | Admitting: Emergency Medicine

## 2017-10-03 ENCOUNTER — Other Ambulatory Visit: Payer: Self-pay

## 2017-10-03 DIAGNOSIS — I480 Paroxysmal atrial fibrillation: Secondary | ICD-10-CM | POA: Diagnosis not present

## 2017-10-03 DIAGNOSIS — J8410 Pulmonary fibrosis, unspecified: Secondary | ICD-10-CM | POA: Diagnosis not present

## 2017-10-03 DIAGNOSIS — T380X5A Adverse effect of glucocorticoids and synthetic analogues, initial encounter: Secondary | ICD-10-CM | POA: Diagnosis not present

## 2017-10-03 DIAGNOSIS — R627 Adult failure to thrive: Secondary | ICD-10-CM | POA: Diagnosis not present

## 2017-10-03 DIAGNOSIS — J449 Chronic obstructive pulmonary disease, unspecified: Secondary | ICD-10-CM | POA: Diagnosis not present

## 2017-10-03 DIAGNOSIS — J9611 Chronic respiratory failure with hypoxia: Secondary | ICD-10-CM | POA: Diagnosis not present

## 2017-10-03 DIAGNOSIS — Z6822 Body mass index (BMI) 22.0-22.9, adult: Secondary | ICD-10-CM | POA: Diagnosis not present

## 2017-10-03 DIAGNOSIS — G8929 Other chronic pain: Secondary | ICD-10-CM | POA: Diagnosis not present

## 2017-10-03 DIAGNOSIS — R739 Hyperglycemia, unspecified: Secondary | ICD-10-CM | POA: Diagnosis not present

## 2017-10-03 DIAGNOSIS — E785 Hyperlipidemia, unspecified: Secondary | ICD-10-CM | POA: Diagnosis not present

## 2017-10-03 DIAGNOSIS — I2510 Atherosclerotic heart disease of native coronary artery without angina pectoris: Secondary | ICD-10-CM | POA: Diagnosis not present

## 2017-10-03 DIAGNOSIS — E43 Unspecified severe protein-calorie malnutrition: Secondary | ICD-10-CM | POA: Diagnosis not present

## 2017-10-03 DIAGNOSIS — K2270 Barrett's esophagus without dysplasia: Secondary | ICD-10-CM | POA: Diagnosis not present

## 2017-10-03 DIAGNOSIS — E1165 Type 2 diabetes mellitus with hyperglycemia: Secondary | ICD-10-CM | POA: Diagnosis not present

## 2017-10-03 DIAGNOSIS — M549 Dorsalgia, unspecified: Secondary | ICD-10-CM | POA: Diagnosis not present

## 2017-10-03 DIAGNOSIS — Z794 Long term (current) use of insulin: Secondary | ICD-10-CM | POA: Diagnosis not present

## 2017-10-03 DIAGNOSIS — Z9981 Dependence on supplemental oxygen: Secondary | ICD-10-CM | POA: Diagnosis not present

## 2017-10-03 DIAGNOSIS — I482 Chronic atrial fibrillation: Secondary | ICD-10-CM | POA: Diagnosis not present

## 2017-10-03 LAB — CBC
HCT: 34.5 % — ABNORMAL LOW (ref 39.0–52.0)
Hemoglobin: 10.9 g/dL — ABNORMAL LOW (ref 13.0–17.0)
MCH: 27 pg (ref 26.0–34.0)
MCHC: 31.6 g/dL (ref 30.0–36.0)
MCV: 85.4 fL (ref 78.0–100.0)
Platelets: 227 10*3/uL (ref 150–400)
RBC: 4.04 MIL/uL — ABNORMAL LOW (ref 4.22–5.81)
RDW: 13.3 % (ref 11.5–15.5)
WBC: 10 10*3/uL (ref 4.0–10.5)

## 2017-10-03 LAB — BASIC METABOLIC PANEL WITH GFR
Anion gap: 12 (ref 5–15)
CO2: 26 mmol/L (ref 22–32)
Chloride: 103 mmol/L (ref 101–111)
GFR calc Af Amer: >60 mL/min (ref >60)
Potassium: 4.2 mmol/L (ref 3.5–5.1)

## 2017-10-03 LAB — CBG MONITORING, ED
Glucose-Capillary: 186 mg/dL — ABNORMAL HIGH (ref 65–99)
Glucose-Capillary: 235 mg/dL — ABNORMAL HIGH (ref 65–99)

## 2017-10-03 LAB — BASIC METABOLIC PANEL
BUN: 25 mg/dL — ABNORMAL HIGH (ref 6–20)
Calcium: 9 mg/dL (ref 8.9–10.3)
Creatinine, Ser: 0.84 mg/dL (ref 0.61–1.24)
GFR calc non Af Amer: >60 mL/min (ref >60)
Glucose, Bld: 121 mg/dL — ABNORMAL HIGH (ref 65–99)
Sodium: 141 mmol/L (ref 135–145)

## 2017-10-03 LAB — GLUCOSE, CAPILLARY
Glucose-Capillary: 110 mg/dL — ABNORMAL HIGH (ref 65–99)
Glucose-Capillary: 122 mg/dL — ABNORMAL HIGH (ref 65–99)
Glucose-Capillary: 306 mg/dL — ABNORMAL HIGH (ref 65–99)
Glucose-Capillary: 297 mg/dL — ABNORMAL HIGH (ref 65–99)
Glucose-Capillary: 133 mg/dL — ABNORMAL HIGH (ref 65–99)

## 2017-10-03 LAB — MRSA PCR SCREENING: MRSA by PCR: NEGATIVE

## 2017-10-03 MED ORDER — UMECLIDINIUM BROMIDE 62.5 MCG/INH IN AEPB
1.0000 | INHALATION_SPRAY | Freq: Every day | RESPIRATORY_TRACT | Status: DC
  Administered 2017-10-03 – 2017-10-04 (×2): 1 via RESPIRATORY_TRACT
  Filled 2017-10-03: qty 7

## 2017-10-03 MED ORDER — ORAL CARE MOUTH RINSE
15.0000 mL | Freq: Two times a day (BID) | OROMUCOSAL | Status: DC
  Administered 2017-10-03 – 2017-10-04 (×3): 15 mL via OROMUCOSAL

## 2017-10-03 MED ORDER — INSULIN ASPART 100 UNIT/ML ~~LOC~~ SOLN: 0.0000 [IU] | Freq: Every day | SUBCUTANEOUS | Status: DC

## 2017-10-03 MED ORDER — INSULIN GLARGINE 100 UNIT/ML ~~LOC~~ SOLN
15.0000 [IU] | SUBCUTANEOUS | Status: DC
  Administered 2017-10-03: 15 [IU] via SUBCUTANEOUS
  Filled 2017-10-03 (×2): qty 0.15

## 2017-10-03 MED ORDER — INSULIN ASPART 100 UNIT/ML ~~LOC~~ SOLN
0.0000 [IU] | Freq: Three times a day (TID) | SUBCUTANEOUS | Status: DC
  Administered 2017-10-03: 2 [IU] via SUBCUTANEOUS
  Administered 2017-10-03: 11 [IU] via SUBCUTANEOUS
  Administered 2017-10-03: 8 [IU] via SUBCUTANEOUS
  Administered 2017-10-04 (×2): 3 [IU] via SUBCUTANEOUS

## 2017-10-03 MED ORDER — INSULIN GLARGINE 100 UNIT/ML ~~LOC~~ SOLN
10.0000 [IU] | SUBCUTANEOUS | Status: DC
  Administered 2017-10-03: 10 [IU] via SUBCUTANEOUS
  Filled 2017-10-03 (×3): qty 0.1

## 2017-10-03 NOTE — Progress Notes (Signed)
Insulin gtt stopped - two hrs after Lantus given.  NS infusing @ 100 ml/hr. Pt tolerating well. Will continue to monitor

## 2017-10-03 NOTE — Progress Notes (Addendum)
Subjective: He says he feels better.  He is coughing up some sputum.  I am going to go ahead and get another AFB culture done while he is here.  His blood sugar is better and he is off the insulin drip.  Objective: Vital signs in last 24 hours: Temp:  [97.7 F (36.5 C)] 97.7 F (36.5 C) (03/23 0235) Pulse Rate:  [58-102] 87 (03/23 0749) Resp:  [15-26] 26 (03/23 0749) BP: (115-149)/(71-95) 130/79 (03/23 0700) SpO2:  [93 %-99 %] 95 % (03/23 0749) Weight:  [65.9 kg (145 lb 4.5 oz)] 65.9 kg (145 lb 4.5 oz) (03/23 0235) Weight change:     Intake/Output from previous day: 03/22 0701 - 03/23 0700 In: 1320.4 [I.V.:820.4; IV Piggyback:500] Out: 0   PHYSICAL EXAM General appearance: alert, cooperative and no distress Resp: Some rhonchi but he is actually clearer than he usually is Cardio: regular rate and rhythm, S1, S2 normal, no murmur, click, rub or gallop GI: soft, non-tender; bowel sounds normal; no masses,  no organomegaly Extremities: extremities normal, atraumatic, no cyanosis or edema  Lab Results:  Results for orders placed or performed during the hospital encounter of 10/02/17 (from the past 48 hour(s))  CBG monitoring, ED     Status: Abnormal   Collection Time: 10/02/17  7:31 PM  Result Value Ref Range   Glucose-Capillary 565 (HH) 65 - 99 mg/dL   Comment 1 Notify RN    Comment 2 Document in Chart   Basic metabolic panel     Status: Abnormal   Collection Time: 10/02/17  8:06 PM  Result Value Ref Range   Sodium 136 135 - 145 mmol/[name]   Potassium 4.9 3.5 - 5.1 mmol/[name]   Chloride 97 ([name]) 101 - 111 mmol/[name]   CO2 24 22 - 32 mmol/[name]   Glucose, Bld 626 (HH) 65 - 99 mg/dL    Comment: CRITICAL RESULT CALLED TO, READ BACK BY AND VERIFIED WITH: TALBOTT,T ON 10/02/17 AT 2045 BY LOY,C    BUN 26 (H) 6 - 20 mg/dL   Creatinine, Ser 1.17 0.61 - 1.24 mg/dL   Calcium 9.0 8.9 - 10.3 mg/dL   GFR calc non Af Amer >60 >60 mL/min   GFR calc Af Amer >60 >60 mL/min    Comment: (NOTE) The eGFR  has been calculated using the CKD EPI equation. This calculation has not been validated in all clinical situations. eGFR's persistently <60 mL/min signify possible Chronic Kidney Disease.    Anion gap 15 5 - 15    Comment: Performed at [facility], [address] [place], [place] [address]  CBC     Status: Abnormal   Collection Time: 10/02/17  8:06 PM  Result Value Ref Range   WBC 9.4 4.0 - 10.5 K/uL   RBC 4.16 ([name]) 4.22 - 5.81 MIL/uL   Hemoglobin 11.4 ([name]) 13.0 - 17.0 g/dL   HCT 36.3 ([name]) 39.0 - 52.0 %   MCV 87.3 78.0 - 100.0 fL   MCH 27.4 26.0 - 34.0 pg   MCHC 31.4 30.0 - 36.0 g/dL   RDW 13.4 11.5 - 15.5 %   Platelets 216 150 - 400 K/uL    Comment: Performed at [facility], [address] [place],  [address]  Urinalysis, Routine w reflex microscopic     Status: Abnormal   Collection Time: 10/02/17  8:06 PM  Result Value Ref Range   Color, Urine STRAW (A) YELLOW   APPearance CLEAR CLEAR   Specific Gravity, Urine 1.026 1.005 -  1.030   pH 5.0 5.0 - 8.0   Glucose, UA >=500 (A) NEGATIVE mg/dL   Hgb urine dipstick SMALL (A) NEGATIVE   Bilirubin Urine NEGATIVE NEGATIVE   Ketones, ur NEGATIVE NEGATIVE mg/dL   Protein, ur NEGATIVE NEGATIVE mg/dL   Nitrite NEGATIVE NEGATIVE   Leukocytes, UA NEGATIVE NEGATIVE   RBC / HPF 0-5 0 - 5 RBC/hpf   WBC, UA 0-5 0 - 5 WBC/hpf   Bacteria, UA NONE SEEN NONE SEEN   Squamous Epithelial / LPF NONE SEEN NONE SEEN    Comment: Performed at [facility], [address], [place], [place] [address]  CBG monitoring, ED     Status: Abnormal   Collection Time: 10/02/17  9:27 PM  Result Value Ref Range   Glucose-Capillary 508 (HH) 65 - 99 mg/dL  CBG monitoring, ED     Status: Abnormal   Collection Time: 10/02/17 10:24 PM  Result Value Ref Range   Glucose-Capillary 365 (H) 65 - 99 mg/dL  CBG monitoring, ED     Status: Abnormal   Collection Time: 10/02/17 11:59 PM  Result Value Ref Range   Glucose-Capillary 235 (H) 65 - 99 mg/dL  CBG monitoring,  ED     Status: Abnormal   Collection Time: 10/03/17 12:55 AM  Result Value Ref Range   Glucose-Capillary 186 (H) 65 - 99 mg/dL  MRSA PCR Screening     Status: None   Collection Time: 10/03/17  2:43 AM  Result Value Ref Range   MRSA by PCR NEGATIVE NEGATIVE    Comment:        The GeneXpert MRSA Assay (FDA approved for NASAL specimens only), is one component of a comprehensive MRSA colonization surveillance program. It is not intended to diagnose MRSA infection nor to guide or monitor treatment for MRSA infections. Performed at [facility], [address], [place], [place] [address]   Basic metabolic panel     Status: Abnormal   Collection Time: 10/03/17  3:21 AM  Result Value Ref Range   Sodium 141 135 - 145 mmol/[name]   Potassium 4.2 3.5 - 5.1 mmol/[name]   Chloride 103 101 - 111 mmol/[name]   CO2 26 22 - 32 mmol/[name]   Glucose, Bld 121 (H) 65 - 99 mg/dL   BUN 25 (H) 6 - 20 mg/dL   Creatinine, Ser 0.84 0.61 - 1.24 mg/dL   Calcium 9.0 8.9 - 10.3 mg/dL   GFR calc non Af Amer >60 >60 mL/min   GFR calc Af Amer >60 >60 mL/min    Comment: (NOTE) The eGFR has been calculated using the CKD EPI equation. This calculation has not been validated in all clinical situations. eGFR's persistently <60 mL/min signify possible Chronic Kidney Disease.    Anion gap 12 5 - 15    Comment: Performed at [facility], [address], [place], [place] [address]  CBC     Status: Abnormal   Collection Time: 10/03/17  3:21 AM  Result Value Ref Range   WBC 10.0 4.0 - 10.5 K/uL   RBC 4.04 ([name]) 4.22 - 5.81 MIL/uL   Hemoglobin 10.9 ([name]) 13.0 - 17.0 g/dL   HCT 34.5 ([name]) 39.0 - 52.0 %   MCV 85.4 78.0 - 100.0 fL   MCH 27.0 26.0 - 34.0 pg   MCHC 31.6 30.0 - 36.0 g/dL   RDW 13.3 11.5 - 15.5 %   Platelets 227 150 - 400 K/uL    Comment: Performed at [facility], [address] [place],  [place] [address]  Glucose, capillary     Status: Abnormal   Collection Time: 10/03/17  5:16 AM  Result Value Ref Range    Glucose-Capillary 110 (H) 65 - 99 mg/dL   Comment 1 Notify RN    Comment 2 Document in Chart   Glucose, capillary     Status: Abnormal   Collection Time: 10/03/17  7:53 AM  Result Value Ref Range   Glucose-Capillary 122 (H) 65 - 99 mg/dL    ABGS No results for input(s): PHART, PO2ART, TCO2, HCO3 in the last 72 hours.  Invalid input(s): PCO2 CULTURES Recent Results (from the past 240 hour(s))  MRSA PCR Screening     Status: None   Collection Time: 10/03/17  2:43 AM  Result Value Ref Range Status   MRSA by PCR NEGATIVE NEGATIVE Final    Comment:        The GeneXpert MRSA Assay (FDA approved for NASAL specimens only), is one component of a comprehensive MRSA colonization surveillance program. It is not intended to diagnose MRSA infection nor to guide or monitor treatment for MRSA infections. Performed at [facility], [address] [place], [place] [address]    Studies/Results: No results found.  Medications:  Prior to Admission:  Medications Prior to Admission  Medication Sig Dispense Refill Last Dose  . albuterol (VENTOLIN HFA) 108 (90 BASE) MCG/ACT inhaler Inhale 2 puffs into the lungs every 6 (six) hours as needed for wheezing or shortness of breath.   Taking  . azithromycin (ZITHROMAX Z-PAK) 250 MG tablet Take by package instructions 6 each 0   . baclofen (LIORESAL) 20 MG tablet Take 20 mg by mouth 3 (three) times daily.    09/14/2017 at Unknown time  . benzonatate (TESSALON) 200 MG capsule Take 200 mg by mouth 3 (three) times daily.   Taking  . CARTIA XT 120 MG 24 hr capsule Take 120 mg by mouth daily.    09/14/2017 at 1200  . cefUROXime (CEFTIN) 500 MG tablet Take 1 tablet (500 mg total) by mouth 2 (two) times daily with a meal. 14 tablet 0 Taking  . fluticasone (FLONASE) 50 MCG/ACT nasal spray Place 2 sprays into both nostrils daily as needed for allergies.    07/03/2017 at Unknown time  . Fluticasone-Umeclidin-Vilant (TRELEGY ELLIPTA) 100-62.5-25 MCG/INH AEPB Inhale 1  puff into the lungs daily. Rinse mouth after you use this 1 each 12   . hydrOXYzine (VISTARIL) 25 MG capsule Take 25 mg by mouth every 6 (six) hours as needed for itching.    07/03/2017 at Unknown time  . insulin glargine (LANTUS) 100 UNIT/ML injection Inject 0.12 mLs (12 Units total) into the skin daily. 10 mL 11 Taking  . insulin lispro (HUMALOG) 100 UNIT/ML injection Take by sliding scale provided by hospital maximum amount daily is 60 units.  Check blood sugar 4 times a day 10 mL 11 Taking  . levalbuterol (XOPENEX) 0.63 MG/3ML nebulizer solution Take 3 mLs (0.63 mg total) by nebulization every 8 (eight) hours as needed for wheezing or shortness of breath. 3 mL 12 09/14/2017 at 1200  . LORazepam (ATIVAN) 0.5 MG tablet Take 1 tablet (0.5 mg total) by mouth 2 (two) times daily as needed for anxiety or sleep. (Patient taking differently: Take 0.5 mg by mouth 4 (four) times daily as needed for anxiety or sleep. ) 20 tablet 0 Taking  . metFORMIN (GLUCOPHAGE) 500 MG tablet Take 1 tablet (500 mg total) by mouth daily with breakfast. 30 tablet 3 Taking  .  montelukast (SINGULAIR) 10 MG tablet Take 10 mg by mouth at bedtime.   Taking  . nitroGLYCERIN (NITROSTAT) 0.4 MG SL tablet Place 1 tablet (0.4 mg total) under the tongue every 5 (five) minutes as needed for chest pain. 25 tablet 4 Taking  . omeprazole (PRILOSEC) 20 MG capsule Take 20 mg by mouth daily.   Taking  . oxyCODONE-acetaminophen (PERCOCET) 10-325 MG tablet Take 1 tablet by mouth every 4 (four) hours as needed for pain.   Taking  . predniSONE (STERAPRED UNI-PAK 21 TAB) 10 MG (21) TBPK tablet Take by package instructions 21 tablet 0 Taking  . saccharomyces boulardii (FLORASTOR) 250 MG capsule Take 1 capsule (250 mg total) by mouth 2 (two) times daily. 60 capsule 1 Taking  . triamcinolone cream (KENALOG) 0.1 % Apply 1 application topically daily as needed (for itching/irritation).    Taking   Scheduled: . baclofen  20 mg Oral TID  . benzonatate   200 mg Oral TID  . cefUROXime  500 mg Oral BID WC  . diltiazem  120 mg Oral Daily  . enoxaparin (LOVENOX) injection  40 mg Subcutaneous Q24H  . fluticasone furoate-vilanterol  1 puff Inhalation Daily  . insulin aspart  0-15 Units Subcutaneous TID WC  . insulin aspart  0-5 Units Subcutaneous QHS  . insulin glargine  15 Units Subcutaneous Q24H  . mouth rinse  15 mL Mouth Rinse BID  . montelukast  10 mg Oral QHS  . pantoprazole  40 mg Oral Daily  . predniSONE  10 mg Oral Q breakfast  . umeclidinium bromide  1 puff Inhalation Daily   Continuous: . sodium chloride Stopped (10/02/17 2245)  . sodium chloride 100 mL/hr at 10/02/17 2245   WIO:[id number] **OR** acetaminophen, fluticasone, levalbuterol, LORazepam, nitroGLYCERIN, ondansetron **OR** ondansetron (ZOFRAN) IV, oxyCODONE-acetaminophen **AND** oxyCODONE, polyethylene glycol  Assesment: He was admitted with elevated blood sugar.  He is better now.  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  Switch over to Lantus/sliding scale. Check AFB smear.  He does not need to be isolated    LOS: 1 day   Ryan [name] 10/03/2017, 8:36 AM

## 2017-10-04 DIAGNOSIS — J449 Chronic obstructive pulmonary disease, unspecified: Secondary | ICD-10-CM | POA: Diagnosis not present

## 2017-10-04 DIAGNOSIS — E1165 Type 2 diabetes mellitus with hyperglycemia: Secondary | ICD-10-CM | POA: Diagnosis not present

## 2017-10-04 DIAGNOSIS — R627 Adult failure to thrive: Secondary | ICD-10-CM | POA: Diagnosis not present

## 2017-10-04 DIAGNOSIS — K2270 Barrett's esophagus without dysplasia: Secondary | ICD-10-CM | POA: Diagnosis not present

## 2017-10-04 DIAGNOSIS — J8410 Pulmonary fibrosis, unspecified: Secondary | ICD-10-CM | POA: Diagnosis not present

## 2017-10-04 DIAGNOSIS — I480 Paroxysmal atrial fibrillation: Secondary | ICD-10-CM | POA: Diagnosis not present

## 2017-10-04 DIAGNOSIS — G8929 Other chronic pain: Secondary | ICD-10-CM | POA: Diagnosis not present

## 2017-10-04 DIAGNOSIS — J9611 Chronic respiratory failure with hypoxia: Secondary | ICD-10-CM | POA: Diagnosis not present

## 2017-10-04 DIAGNOSIS — E785 Hyperlipidemia, unspecified: Secondary | ICD-10-CM | POA: Diagnosis not present

## 2017-10-04 DIAGNOSIS — Z9981 Dependence on supplemental oxygen: Secondary | ICD-10-CM | POA: Diagnosis not present

## 2017-10-04 DIAGNOSIS — M549 Dorsalgia, unspecified: Secondary | ICD-10-CM | POA: Diagnosis not present

## 2017-10-04 DIAGNOSIS — Z6822 Body mass index (BMI) 22.0-22.9, adult: Secondary | ICD-10-CM | POA: Diagnosis not present

## 2017-10-04 DIAGNOSIS — E43 Unspecified severe protein-calorie malnutrition: Secondary | ICD-10-CM | POA: Diagnosis not present

## 2017-10-04 DIAGNOSIS — I2510 Atherosclerotic heart disease of native coronary artery without angina pectoris: Secondary | ICD-10-CM | POA: Diagnosis not present

## 2017-10-04 DIAGNOSIS — R739 Hyperglycemia, unspecified: Secondary | ICD-10-CM | POA: Diagnosis not present

## 2017-10-04 DIAGNOSIS — Z794 Long term (current) use of insulin: Secondary | ICD-10-CM | POA: Diagnosis not present

## 2017-10-04 DIAGNOSIS — I482 Chronic atrial fibrillation: Secondary | ICD-10-CM | POA: Diagnosis not present

## 2017-10-04 DIAGNOSIS — T380X5A Adverse effect of glucocorticoids and synthetic analogues, initial encounter: Secondary | ICD-10-CM | POA: Diagnosis not present

## 2017-10-04 LAB — GLUCOSE, CAPILLARY
Glucose-Capillary: 156 mg/dL — ABNORMAL HIGH (ref 65–99)
Glucose-Capillary: 182 mg/dL — ABNORMAL HIGH (ref 65–99)

## 2017-10-04 MED ORDER — INSULIN GLARGINE 100 UNIT/ML ~~LOC~~ SOLN: 16.0000 [IU] | Freq: Every day | SUBCUTANEOUS | 11 refills | Status: DC

## 2017-10-04 NOTE — Progress Notes (Signed)
Subjective: He feels better.  His blood sugar is better although he still had 300 yesterday.  Objective: Vital signs in last 24 hours: Temp:  [98 F (36.7 C)-99.4 F (37.4 C)] 98 F (36.7 C) (03/24 0400) Pulse Rate:  [61-101] 95 (03/24 0647) Resp:  [13-26] 20 (03/24 0647) BP: (107-147)/(54-104) 124/78 (03/24 0600) SpO2:  [90 %-99 %] 94 % (03/24 0647) Weight:  [65.9 kg (145 lb 4.5 oz)] 65.9 kg (145 lb 4.5 oz) (03/24 0500) Weight change: 0 kg (0 lb)    Intake/Output from previous day: 03/23 0701 - 03/24 0700 In: 2445 [I.V.:2445] Out: 2275 [Urine:2275]  PHYSICAL EXAM General appearance: alert, cooperative and no distress Resp: rhonchi bilaterally Cardio: irregularly irregular rhythm GI: soft, non-tender; bowel sounds normal; no masses,  no organomegaly Extremities: extremities normal, atraumatic, no cyanosis or edema  Lab Results:  Results for orders placed or performed during the hospital encounter of 10/02/17 (from the past 48 hour(s))  CBG monitoring, ED     Status: Abnormal   Collection Time: 10/02/17  7:31 PM  Result Value Ref Range   Glucose-Capillary 565 (HH) 65 - 99 mg/dL   Comment 1 Notify RN    Comment 2 Document in Chart   Basic metabolic panel     Status: Abnormal   Collection Time: 10/02/17  8:06 PM  Result Value Ref Range   Sodium 136 135 - 145 mmol/L   Potassium 4.9 3.5 - 5.1 mmol/L   Chloride 97 (L) 101 - 111 mmol/L   CO2 24 22 - 32 mmol/L   Glucose, Bld 626 (HH) 65 - 99 mg/dL    Comment: CRITICAL RESULT CALLED TO, READ BACK BY AND VERIFIED WITH: TALBOTT,T ON 10/02/17 AT 2045 BY LOY,C    BUN 26 (H) 6 - 20 mg/dL   Creatinine, Ser 1.17 0.61 - 1.24 mg/dL   Calcium 9.0 8.9 - 10.3 mg/dL   GFR calc non Af Amer >60 >60 mL/min   GFR calc Af Amer >60 >60 mL/min    Comment: (NOTE) The eGFR has been calculated using the CKD EPI equation. This calculation has not been validated in all clinical situations. eGFR's persistently <60 mL/min signify possible Chronic  Kidney Disease.    Anion gap 15 5 - 15    Comment: Performed at [facility], [address] [place], [place] [address]  CBC     Status: Abnormal   Collection Time: 10/02/17  8:06 PM  Result Value Ref Range   WBC 9.4 4.0 - 10.5 K/uL   RBC 4.16 (L) 4.22 - 5.81 MIL/uL   Hemoglobin 11.4 (L) 13.0 - 17.0 g/dL   HCT 36.3 (L) 39.0 - 52.0 %   MCV 87.3 78.0 - 100.0 fL   MCH 27.4 26.0 - 34.0 pg   MCHC 31.4 30.0 - 36.0 g/dL   RDW 13.4 11.5 - 15.5 %   Platelets 216 150 - 400 K/uL    Comment: Performed at [facility], [address] [place], [place] [address]  Urinalysis, Routine w reflex microscopic     Status: Abnormal   Collection Time: 10/02/17  8:06 PM  Result Value Ref Range   Color, Urine STRAW (A) YELLOW   APPearance CLEAR CLEAR   Specific Gravity, Urine 1.026 1.005 - 1.030   pH 5.0 5.0 - 8.0   Glucose, UA >=500 (A) NEGATIVE mg/dL   Hgb urine dipstick SMALL (A) NEGATIVE   Bilirubin Urine NEGATIVE NEGATIVE   Ketones, ur NEGATIVE NEGATIVE mg/dL   Protein, ur NEGATIVE  NEGATIVE mg/dL   Nitrite NEGATIVE NEGATIVE   Leukocytes, UA NEGATIVE NEGATIVE   RBC / HPF 0-5 0 - 5 RBC/hpf   WBC, UA 0-5 0 - 5 WBC/hpf   Bacteria, UA NONE SEEN NONE SEEN   Squamous Epithelial / LPF NONE SEEN NONE SEEN    Comment: Performed at [facility], [address] [place], [place] [address]  CBG monitoring, ED     Status: Abnormal   Collection Time: 10/02/17  9:27 PM  Result Value Ref Range   Glucose-Capillary 508 (HH) 65 - 99 mg/dL  CBG monitoring, ED     Status: Abnormal   Collection Time: 10/02/17 10:24 PM  Result Value Ref Range   Glucose-Capillary 365 (H) 65 - 99 mg/dL  CBG monitoring, ED     Status: Abnormal   Collection Time: 10/02/17 11:59 PM  Result Value Ref Range   Glucose-Capillary 235 (H) 65 - 99 mg/dL  CBG monitoring, ED     Status: Abnormal   Collection Time: 10/03/17 12:55 AM  Result Value Ref Range   Glucose-Capillary 186 (H) 65 - 99 mg/dL  MRSA PCR Screening     Status: None    Collection Time: 10/03/17  2:43 AM  Result Value Ref Range   MRSA by PCR NEGATIVE NEGATIVE    Comment:        The GeneXpert MRSA Assay (FDA approved for NASAL specimens only), is one component of a comprehensive MRSA colonization surveillance program. It is not intended to diagnose MRSA infection nor to guide or monitor treatment for MRSA infections. Performed at [facility], [address] [place], [place] [address]   Basic metabolic panel     Status: Abnormal   Collection Time: 10/03/17  3:21 AM  Result Value Ref Range   Sodium 141 135 - 145 mmol/L   Potassium 4.2 3.5 - 5.1 mmol/L   Chloride 103 101 - 111 mmol/L   CO2 26 22 - 32 mmol/L   Glucose, Bld 121 (H) 65 - 99 mg/dL   BUN 25 (H) 6 - 20 mg/dL   Creatinine, Ser 0.84 0.61 - 1.24 mg/dL   Calcium 9.0 8.9 - 10.3 mg/dL   GFR calc non Af Amer >60 >60 mL/min   GFR calc Af Amer >60 >60 mL/min    Comment: (NOTE) The eGFR has been calculated using the CKD EPI equation. This calculation has not been validated in all clinical situations. eGFR's persistently <60 mL/min signify possible Chronic Kidney Disease.    Anion gap 12 5 - 15    Comment: Performed at [facility], [address], [place], [place] [address]  CBC     Status: Abnormal   Collection Time: 10/03/17  3:21 AM  Result Value Ref Range   WBC 10.0 4.0 - 10.5 K/uL   RBC 4.04 (L) 4.22 - 5.81 MIL/uL   Hemoglobin 10.9 (L) 13.0 - 17.0 g/dL   HCT 34.5 (L) 39.0 - 52.0 %   MCV 85.4 78.0 - 100.0 fL   MCH 27.0 26.0 - 34.0 pg   MCHC 31.6 30.0 - 36.0 g/dL   RDW 13.3 11.5 - 15.5 %   Platelets 227 150 - 400 K/uL    Comment: Performed at [facility], [address], [place], [place] [address]  Glucose, capillary     Status: Abnormal   Collection Time: 10/03/17  5:16 AM  Result Value Ref Range   Glucose-Capillary 110 (H) 65 - 99 mg/dL   Comment 1 Notify RN  Comment 2 Document in Chart   Glucose, capillary     Status: Abnormal   Collection Time: 10/03/17  7:53 AM  Result  Value Ref Range   Glucose-Capillary 122 (H) 65 - 99 mg/dL  Glucose, capillary     Status: Abnormal   Collection Time: 10/03/17 12:08 PM  Result Value Ref Range   Glucose-Capillary 306 (H) 65 - 99 mg/dL  Glucose, capillary     Status: Abnormal   Collection Time: 10/03/17  5:00 PM  Result Value Ref Range   Glucose-Capillary 297 (H) 65 - 99 mg/dL  Glucose, capillary     Status: Abnormal   Collection Time: 10/03/17  9:54 PM  Result Value Ref Range   Glucose-Capillary 133 (H) 65 - 99 mg/dL    ABGS No results for input(s): PHART, PO2ART, TCO2, HCO3 in the last 72 hours.  Invalid input(s): PCO2 CULTURES Recent Results (from the past 240 hour(s))  MRSA PCR Screening     Status: None   Collection Time: 10/03/17  2:43 AM  Result Value Ref Range Status   MRSA by PCR NEGATIVE NEGATIVE Final    Comment:        The GeneXpert MRSA Assay (FDA approved for NASAL specimens only), is one component of a comprehensive MRSA colonization surveillance program. It is not intended to diagnose MRSA infection nor to guide or monitor treatment for MRSA infections. Performed at [facility] [address], [place], [place] [address]    Studies/Results: No results found.  Medications:  Prior to Admission:  Medications Prior to Admission  Medication Sig Dispense Refill Last Dose  . albuterol (VENTOLIN HFA) 108 (90 BASE) MCG/ACT inhaler Inhale 2 puffs into the lungs every 6 (six) hours as needed for wheezing or shortness of breath.   10/02/2017 at Unknown time  . azithromycin (ZITHROMAX Z-PAK) 250 MG tablet Take by package instructions 6 each 0 10/02/2017 at Unknown time  . baclofen (LIORESAL) 20 MG tablet Take 20 mg by mouth 3 (three) times daily.    10/02/2017 at Unknown time  . benzonatate (TESSALON) 200 MG capsule Take 200 mg by mouth 3 (three) times daily.   10/02/2017 at Unknown time  . CARTIA XT 120 MG 24 hr capsule Take 120 mg by mouth daily.    10/02/2017 at Unknown time  .  Fluticasone-Umeclidin-Vilant (TRELEGY ELLIPTA) 100-62.5-25 MCG/INH AEPB Inhale 1 puff into the lungs daily. Rinse mouth after you use this 1 each 12 10/02/2017 at Unknown time  . insulin lispro (HUMALOG) 100 UNIT/ML injection Take by sliding scale provided by hospital maximum amount daily is 60 units.  Check blood sugar 4 times a day 10 mL 11 10/02/2017 at Unknown time  . levalbuterol (XOPENEX) 0.63 MG/3ML nebulizer solution Take 3 mLs (0.63 mg total) by nebulization every 8 (eight) hours as needed for wheezing or shortness of breath. 3 mL 12 10/02/2017 at Unknown time  . LORazepam (ATIVAN) 0.5 MG tablet Take 1 tablet (0.5 mg total) by mouth 2 (two) times daily as needed for anxiety or sleep. (Patient taking differently: Take 0.5 mg by mouth 4 (four) times daily as needed for anxiety or sleep. ) 20 tablet 0 10/02/2017 at Unknown time  . metFORMIN (GLUCOPHAGE) 500 MG tablet Take 1 tablet (500 mg total) by mouth daily with breakfast. 30 tablet 3 10/02/2017 at Unknown time  . montelukast (SINGULAIR) 10 MG tablet Take 10 mg by mouth at bedtime.   Past Week at Unknown time  . nitroGLYCERIN (NITROSTAT) 0.4 MG SL tablet  Place 1 tablet (0.4 mg total) under the tongue every 5 (five) minutes as needed for chest pain. 25 tablet 4 Taking  . omeprazole (PRILOSEC) 20 MG capsule Take 20 mg by mouth daily.   10/02/2017 at Unknown time  . oxyCODONE-acetaminophen (PERCOCET) 10-325 MG tablet Take 1 tablet by mouth every 4 (four) hours as needed for pain.   10/02/2017 at Unknown time  . predniSONE (STERAPRED UNI-PAK 21 TAB) 10 MG (21) TBPK tablet Take by package instructions 21 tablet 0 10/02/2017 at Unknown time  . [DISCONTINUED] insulin glargine (LANTUS) 100 UNIT/ML injection Inject 0.12 mLs (12 Units total) into the skin daily. 10 mL 11 10/02/2017 at Unknown time  . ipratropium (ATROVENT) 0.02 % nebulizer solution VVN TID WITH LEVALBUTEROL  5    Scheduled: . baclofen  20 mg Oral TID  . benzonatate  200 mg Oral TID  .  cefUROXime  500 mg Oral BID WC  . diltiazem  120 mg Oral Daily  . enoxaparin (LOVENOX) injection  40 mg Subcutaneous Q24H  . fluticasone furoate-vilanterol  1 puff Inhalation Daily  . insulin aspart  0-15 Units Subcutaneous TID WC  . insulin aspart  0-5 Units Subcutaneous QHS  . insulin glargine  15 Units Subcutaneous Q24H  . mouth rinse  15 mL Mouth Rinse BID  . montelukast  10 mg Oral QHS  . pantoprazole  40 mg Oral Daily  . predniSONE  10 mg Oral Q breakfast  . umeclidinium bromide  1 puff Inhalation Daily   Continuous: . sodium chloride 75 mL/hr at 10/04/17 0540  . sodium chloride Stopped (10/03/17 1600)   VDI:[id number] **OR** acetaminophen, fluticasone, levalbuterol, LORazepam, nitroGLYCERIN, ondansetron **OR** ondansetron (ZOFRAN) IV, oxyCODONE-acetaminophen **AND** oxyCODONE, polyethylene glycol  Assesment: He was admitted with elevated blood sugar.  He has diabetes but he was on prednisone and had an acute illness and I think that is what caused his problem.  He is doing much better and I think he can manage his blood sugar at home now.  He has severe COPD at baseline  He had mycobacterial infection and I had him do another sputum yesterday.  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

## 2017-10-04 NOTE — Discharge Summary (Signed)
Physician Discharge Summary  Patient ID: Ryan [name] MRN: [id number] DOB/AGE: December 19, 1960 57 y.o. 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  quit smoking 05/01/15    Protein-calorie malnutrition, severe (HCC)   A-fib (HCC)   Chronic atrial fibrillation (HCC)   Chronic respiratory failure with hypoxia (HCC)   Allergies as of 10/04/2017      Reactions   Albuterol Palpitations   Ciprofloxacin Other (See Comments)   Patient experiences vision loss from long-term and short-term use   Influenza Vaccine Live Swelling      Medication List    TAKE these medications   azithromycin 250 MG tablet Commonly known as:  ZITHROMAX Z-PAK Take by package instructions   baclofen 20 MG tablet Commonly known as:  LIORESAL Take 20 mg by mouth 3 (three) times daily.   benzonatate 200 MG capsule Commonly known as:  TESSALON Take 200 mg by mouth 3 (three) times daily.   CARTIA XT 120 MG 24 hr capsule Generic drug:  diltiazem Take 120 mg by mouth daily.   Fluticasone-Umeclidin-Vilant 100-62.5-25 MCG/INH Aepb Commonly known as:  TRELEGY ELLIPTA Inhale 1 puff into the lungs daily. Rinse mouth after you use this   insulin glargine 100 UNIT/ML injection Commonly known as:  LANTUS Inject 0.16 mLs (16 Units total) into the skin daily. What changed:  how much to take   insulin lispro 100 UNIT/ML injection Commonly known as:  HUMALOG Take by sliding scale provided by hospital maximum amount daily is 60 units.  Check blood sugar 4 times a day   ipratropium 0.02 % nebulizer solution Commonly known as:  ATROVENT VVN TID WITH LEVALBUTEROL   levalbuterol 0.63 MG/3ML nebulizer solution Commonly known as:  XOPENEX Take 3 mLs (0.63 mg total) by nebulization every 8 (eight) hours as needed for wheezing or shortness of  breath.   LORazepam 0.5 MG tablet Commonly known as:  ATIVAN Take 1 tablet (0.5 mg total) by mouth 2 (two) times daily as needed for anxiety or sleep. What changed:  when to take this   metFORMIN 500 MG tablet Commonly known as:  GLUCOPHAGE Take 1 tablet (500 mg total) by mouth daily with breakfast.   montelukast 10 MG tablet Commonly known as:  SINGULAIR Take 10 mg by mouth at bedtime.   nitroGLYCERIN 0.4 MG SL tablet Commonly known as:  NITROSTAT Place 1 tablet (0.4 mg total) under the tongue every 5 (five) minutes as needed for chest pain.   omeprazole 20 MG capsule Commonly known as:  PRILOSEC Take 20 mg by mouth daily.   oxyCODONE-acetaminophen 10-325 MG tablet Commonly known as:  PERCOCET Take 1 tablet by mouth every 4 (four) hours as needed for pain.   predniSONE 10 MG (21) Tbpk tablet Commonly known as:  STERAPRED UNI-PAK 21 TAB Take by package instructions   VENTOLIN HFA 108 (90 Base) MCG/ACT inhaler Generic drug:  albuterol Inhale 2 puffs into the lungs every 6 (six) hours as needed for wheezing or shortness of breath.       Discharged Condition: Improved    Consults: None  Significant Diagnostic Studies: Dg Chest Port 1 View  Result Date: 09/15/2017 CLINICAL DATA:  Shortness of breath. EXAM: PORTABLE CHEST 1 VIEW COMPARISON:  Chest CT 09/01/2017 FINDINGS: Advanced emphysema and hyperinflation. Peripheral left lower lobe cavitary lesion with surrounding soft tissue density,  better assessed on prior MRI. The left upper lobe cavitary lesion on prior CT is only faintly visualized radiographically. Normal heart size. Unchanged mediastinal contours. Chronic bronchial thickening. No pulmonary edema, pleural effusion or pneumothorax. IMPRESSION: 1. Advanced emphysema with cavitary left lung lesions, assessed on recent CT. 2. No evidence of acute abnormality. Electronically Signed   By: Jeb Levering M.D.   On: 09/15/2017 05:34    Lab Results: Basic Metabolic  Panel: Recent Labs    10/02/17 2006 10/03/17 0321  NA 136 141  K 4.9 4.2  CL 97* 103  CO2 24 26  GLUCOSE 626* 121*  BUN 26* 25*  CREATININE 1.17 0.84  CALCIUM 9.0 9.0   Liver Function Tests: No results for input(s): AST, ALT, ALKPHOS, BILITOT, PROT, ALBUMIN in the last 72 hours.   CBC: Recent Labs    10/02/17 2006 10/03/17 0321  WBC 9.4 10.0  HGB 11.4* 10.9*  HCT 36.3* 34.5*  MCV 87.3 85.4  PLT 216 227    Recent Results (from the past 240 hour(s))  MRSA PCR Screening     Status: None   Collection Time: 10/03/17  2:43 AM  Result Value Ref Range Status   MRSA by PCR NEGATIVE NEGATIVE Final    Comment:        The GeneXpert MRSA Assay (FDA approved for NASAL specimens only), is one component of a comprehensive MRSA colonization surveillance program. It is not intended to diagnose MRSA infection nor to guide or monitor treatment for MRSA infections. Performed at [facility], [address], [place], [place] [address]      Hospital Course: This is a 57 year old who came to the emergency department because his blood sugar was high.  He had been sick in the hospital with pneumonia and went home on a steroid taper because of his COPD.  He then came back to my office about 2 days prior to admission and appeared to be having something of a COPD exacerbation and he was put back on steroids at that point.  His blood sugar went up and he was not able to get it down with insulin.  He was started on insulin drip in the hospital his blood sugar back came under much better control.  He was back on sliding scale and basal insulin by the time of discharge and was much improved his breathing is stable  Discharge Exam: Blood pressure 124/78, pulse 95, temperature 98 F (36.7 C), temperature source Oral, resp. rate 20, height 5\' 8"  (1.727 m), weight 65.9 kg (145 lb 4.5 oz), SpO2 94 %. He is awake and alert.  His chest although he is coughing up sputum is pretty clear.  His heart is  irregular.  His abdomen is soft  Disposition: Home increase his Lantus.  He will stay on 10 mg of prednisone instead of finishing the taper.  He has an appointment to see me next week      Signed: Jakerria [name] L   10/04/2017, 7:30 AM

## 2017-10-05 ENCOUNTER — Other Ambulatory Visit: Payer: Self-pay | Admitting: Pharmacist

## 2017-10-05 DIAGNOSIS — Z794 Long term (current) use of insulin: Secondary | ICD-10-CM | POA: Diagnosis not present

## 2017-10-05 DIAGNOSIS — A310 Pulmonary mycobacterial infection: Secondary | ICD-10-CM | POA: Diagnosis not present

## 2017-10-05 DIAGNOSIS — I480 Paroxysmal atrial fibrillation: Secondary | ICD-10-CM | POA: Diagnosis not present

## 2017-10-05 DIAGNOSIS — J984 Other disorders of lung: Secondary | ICD-10-CM | POA: Diagnosis not present

## 2017-10-05 DIAGNOSIS — J439 Emphysema, unspecified: Secondary | ICD-10-CM | POA: Diagnosis not present

## 2017-10-05 DIAGNOSIS — Z9981 Dependence on supplemental oxygen: Secondary | ICD-10-CM | POA: Diagnosis not present

## 2017-10-05 DIAGNOSIS — E119 Type 2 diabetes mellitus without complications: Secondary | ICD-10-CM | POA: Diagnosis not present

## 2017-10-05 DIAGNOSIS — J9621 Acute and chronic respiratory failure with hypoxia: Secondary | ICD-10-CM | POA: Diagnosis not present

## 2017-10-05 LAB — GLUCOSE, CAPILLARY: Glucose-Capillary: 107 mg/dL — ABNORMAL HIGH (ref 65–99)

## 2017-10-05 LAB — ACID FAST SMEAR (AFB, MYCOBACTERIA)

## 2017-10-05 LAB — ACID FAST SMEAR (AFB): ACID FAST SMEAR - AFSCU2: NEGATIVE

## 2017-10-05 NOTE — Patient Outreach (Signed)
[name]) Care Management  10/05/2017  Ryan [name] 07-26-1960 [id number]  Second outreach attempt to patient successful.  Patient verified HIPAA details.  Noted per chart review patient was discharged from [facility] 10/04/17.   Offered to complete medication review with patient---he declines and reports he understands his medications.  When asked if he has all of his medications, he reports he does.    Original referral was for patient assistance evaluation.  On 09/24/17 phone call with patient, had discussed Social Security Extra Help---he had reported he preferred to apply himself at [employer] office.   Today patient reports he has not had time to do this yet.  He reports he has not reviewed household income to verify if he meets requirements.   Discussed with patient [facility] Pharmacist can help him complete application online, he declines and reports he prefers to go to [employer] office.   Discussed with patient options for manufacturer patient assistance are limited if he feels he may be eligible for Social Security Extra Help and he was encouraged to apply as soon as possible to determine if he is eligible or not.   Plan:  As patient declines wanting to review medication with [facility] Pharmacist and declines wanting [facility] Pharmacist to assist him in applying for Extra Help, will close pharmacy episode at this time.   He was encouraged to contact [place] if he would like [facility] Pharmacist assistance later and verbalized he has phone number.    Will route this note to [place].    Karrie Meres, PharmD, [place] [phone]

## 2017-10-06 ENCOUNTER — Encounter: Payer: Self-pay | Admitting: Pharmacist

## 2017-10-07 DIAGNOSIS — J449 Chronic obstructive pulmonary disease, unspecified: Secondary | ICD-10-CM | POA: Diagnosis not present

## 2017-10-07 DIAGNOSIS — I482 Chronic atrial fibrillation: Secondary | ICD-10-CM | POA: Diagnosis not present

## 2017-10-07 DIAGNOSIS — E1165 Type 2 diabetes mellitus with hyperglycemia: Secondary | ICD-10-CM | POA: Diagnosis not present

## 2017-10-07 DIAGNOSIS — J9610 Chronic respiratory failure, unspecified whether with hypoxia or hypercapnia: Secondary | ICD-10-CM | POA: Diagnosis not present

## 2017-10-08 DIAGNOSIS — Z794 Long term (current) use of insulin: Secondary | ICD-10-CM | POA: Diagnosis not present

## 2017-10-08 DIAGNOSIS — Z9981 Dependence on supplemental oxygen: Secondary | ICD-10-CM | POA: Diagnosis not present

## 2017-10-08 DIAGNOSIS — A310 Pulmonary mycobacterial infection: Secondary | ICD-10-CM | POA: Diagnosis not present

## 2017-10-08 DIAGNOSIS — J9621 Acute and chronic respiratory failure with hypoxia: Secondary | ICD-10-CM | POA: Diagnosis not present

## 2017-10-08 DIAGNOSIS — J439 Emphysema, unspecified: Secondary | ICD-10-CM | POA: Diagnosis not present

## 2017-10-08 DIAGNOSIS — J984 Other disorders of lung: Secondary | ICD-10-CM | POA: Diagnosis not present

## 2017-10-08 DIAGNOSIS — I480 Paroxysmal atrial fibrillation: Secondary | ICD-10-CM | POA: Diagnosis not present

## 2017-10-08 DIAGNOSIS — E119 Type 2 diabetes mellitus without complications: Secondary | ICD-10-CM | POA: Diagnosis not present

## 2017-10-13 DIAGNOSIS — Z9981 Dependence on supplemental oxygen: Secondary | ICD-10-CM | POA: Diagnosis not present

## 2017-10-13 DIAGNOSIS — I480 Paroxysmal atrial fibrillation: Secondary | ICD-10-CM | POA: Diagnosis not present

## 2017-10-13 DIAGNOSIS — E119 Type 2 diabetes mellitus without complications: Secondary | ICD-10-CM | POA: Diagnosis not present

## 2017-10-13 DIAGNOSIS — Z794 Long term (current) use of insulin: Secondary | ICD-10-CM | POA: Diagnosis not present

## 2017-10-13 DIAGNOSIS — J439 Emphysema, unspecified: Secondary | ICD-10-CM | POA: Diagnosis not present

## 2017-10-13 DIAGNOSIS — A310 Pulmonary mycobacterial infection: Secondary | ICD-10-CM | POA: Diagnosis not present

## 2017-10-13 DIAGNOSIS — J984 Other disorders of lung: Secondary | ICD-10-CM | POA: Diagnosis not present

## 2017-10-13 DIAGNOSIS — J9621 Acute and chronic respiratory failure with hypoxia: Secondary | ICD-10-CM | POA: Diagnosis not present

## 2017-10-15 DIAGNOSIS — A310 Pulmonary mycobacterial infection: Secondary | ICD-10-CM | POA: Diagnosis not present

## 2017-10-15 DIAGNOSIS — Z794 Long term (current) use of insulin: Secondary | ICD-10-CM | POA: Diagnosis not present

## 2017-10-15 DIAGNOSIS — J984 Other disorders of lung: Secondary | ICD-10-CM | POA: Diagnosis not present

## 2017-10-15 DIAGNOSIS — J9621 Acute and chronic respiratory failure with hypoxia: Secondary | ICD-10-CM | POA: Diagnosis not present

## 2017-10-15 DIAGNOSIS — E119 Type 2 diabetes mellitus without complications: Secondary | ICD-10-CM | POA: Diagnosis not present

## 2017-10-15 DIAGNOSIS — J439 Emphysema, unspecified: Secondary | ICD-10-CM | POA: Diagnosis not present

## 2017-10-15 DIAGNOSIS — Z9981 Dependence on supplemental oxygen: Secondary | ICD-10-CM | POA: Diagnosis not present

## 2017-10-15 DIAGNOSIS — I480 Paroxysmal atrial fibrillation: Secondary | ICD-10-CM | POA: Diagnosis not present

## 2017-10-17 ENCOUNTER — Other Ambulatory Visit: Payer: Self-pay

## 2017-10-17 ENCOUNTER — Encounter (HOSPITAL_COMMUNITY): Payer: Self-pay | Admitting: *Deleted

## 2017-10-17 ENCOUNTER — Emergency Department (HOSPITAL_COMMUNITY): Payer: Medicare Other

## 2017-10-17 ENCOUNTER — Inpatient Hospital Stay (HOSPITAL_COMMUNITY)
Admission: EM | Admit: 2017-10-17 | Discharge: 2017-10-24 | DRG: 190 | Disposition: A | Discharge status: Home Health | Payer: Medicare Other | Attending: Pulmonary Disease | Admitting: Pulmonary Disease

## 2017-10-17 DIAGNOSIS — Z887 Allergy status to serum and vaccine status: Secondary | ICD-10-CM | POA: Diagnosis not present

## 2017-10-17 DIAGNOSIS — M549 Dorsalgia, unspecified: Secondary | ICD-10-CM | POA: Diagnosis not present

## 2017-10-17 DIAGNOSIS — Z833 Family history of diabetes mellitus: Secondary | ICD-10-CM | POA: Diagnosis not present

## 2017-10-17 DIAGNOSIS — R600 Localized edema: Secondary | ICD-10-CM | POA: Diagnosis present

## 2017-10-17 DIAGNOSIS — R05 Cough: Secondary | ICD-10-CM | POA: Diagnosis not present

## 2017-10-17 DIAGNOSIS — Z9981 Dependence on supplemental oxygen: Secondary | ICD-10-CM | POA: Diagnosis not present

## 2017-10-17 DIAGNOSIS — E119 Type 2 diabetes mellitus without complications: Secondary | ICD-10-CM

## 2017-10-17 DIAGNOSIS — E1165 Type 2 diabetes mellitus with hyperglycemia: Secondary | ICD-10-CM | POA: Diagnosis not present

## 2017-10-17 DIAGNOSIS — J9601 Acute respiratory failure with hypoxia: Secondary | ICD-10-CM | POA: Diagnosis not present

## 2017-10-17 DIAGNOSIS — J449 Chronic obstructive pulmonary disease, unspecified: Secondary | ICD-10-CM | POA: Diagnosis not present

## 2017-10-17 DIAGNOSIS — R0602 Shortness of breath: Secondary | ICD-10-CM | POA: Diagnosis not present

## 2017-10-17 DIAGNOSIS — Z8249 Family history of ischemic heart disease and other diseases of the circulatory system: Secondary | ICD-10-CM

## 2017-10-17 DIAGNOSIS — G8929 Other chronic pain: Secondary | ICD-10-CM | POA: Diagnosis present

## 2017-10-17 DIAGNOSIS — J8410 Pulmonary fibrosis, unspecified: Secondary | ICD-10-CM | POA: Diagnosis present

## 2017-10-17 DIAGNOSIS — Z792 Long term (current) use of antibiotics: Secondary | ICD-10-CM

## 2017-10-17 DIAGNOSIS — R000 Tachycardia, unspecified: Secondary | ICD-10-CM | POA: Diagnosis not present

## 2017-10-17 DIAGNOSIS — I5033 Acute on chronic diastolic (congestive) heart failure: Secondary | ICD-10-CM | POA: Diagnosis not present

## 2017-10-17 DIAGNOSIS — I340 Nonrheumatic mitral (valve) insufficiency: Secondary | ICD-10-CM | POA: Diagnosis not present

## 2017-10-17 DIAGNOSIS — Z794 Long term (current) use of insulin: Secondary | ICD-10-CM | POA: Diagnosis not present

## 2017-10-17 DIAGNOSIS — J9621 Acute and chronic respiratory failure with hypoxia: Secondary | ICD-10-CM | POA: Diagnosis not present

## 2017-10-17 DIAGNOSIS — J9610 Chronic respiratory failure, unspecified whether with hypoxia or hypercapnia: Secondary | ICD-10-CM | POA: Diagnosis not present

## 2017-10-17 DIAGNOSIS — F17210 Nicotine dependence, cigarettes, uncomplicated: Secondary | ICD-10-CM | POA: Diagnosis present

## 2017-10-17 DIAGNOSIS — A310 Pulmonary mycobacterial infection: Secondary | ICD-10-CM | POA: Diagnosis present

## 2017-10-17 DIAGNOSIS — D638 Anemia in other chronic diseases classified elsewhere: Secondary | ICD-10-CM | POA: Diagnosis present

## 2017-10-17 DIAGNOSIS — R079 Chest pain, unspecified: Secondary | ICD-10-CM | POA: Diagnosis not present

## 2017-10-17 DIAGNOSIS — J441 Chronic obstructive pulmonary disease with (acute) exacerbation: Secondary | ICD-10-CM | POA: Diagnosis not present

## 2017-10-17 DIAGNOSIS — K219 Gastro-esophageal reflux disease without esophagitis: Secondary | ICD-10-CM | POA: Diagnosis not present

## 2017-10-17 DIAGNOSIS — R609 Edema, unspecified: Secondary | ICD-10-CM

## 2017-10-17 DIAGNOSIS — R0902 Hypoxemia: Secondary | ICD-10-CM | POA: Diagnosis not present

## 2017-10-17 DIAGNOSIS — I5032 Chronic diastolic (congestive) heart failure: Secondary | ICD-10-CM | POA: Diagnosis present

## 2017-10-17 DIAGNOSIS — I480 Paroxysmal atrial fibrillation: Secondary | ICD-10-CM | POA: Diagnosis present

## 2017-10-17 DIAGNOSIS — J439 Emphysema, unspecified: Secondary | ICD-10-CM | POA: Diagnosis not present

## 2017-10-17 LAB — COMPREHENSIVE METABOLIC PANEL
ALK PHOS: 91 U/L (ref 38–126)
ALT: 42 U/L (ref 17–63)
ANION GAP: 14 (ref 5–15)
AST: 34 U/L (ref 15–41)
Albumin: 2.7 g/dL — ABNORMAL LOW (ref 3.5–5.0)
BILIRUBIN TOTAL: 0.7 mg/dL (ref 0.3–1.2)
BUN: 19 mg/dL (ref 6–20)
CALCIUM: 8.5 mg/dL — AB (ref 8.9–10.3)
CO2: 27 mmol/L (ref 22–32)
CREATININE: 1.07 mg/dL (ref 0.61–1.24)
Chloride: 94 mmol/L — ABNORMAL LOW (ref 101–111)
GFR calc non Af Amer: >60 mL/min (ref >60)
GLUCOSE: 151 mg/dL — AB (ref 65–99)
Potassium: 3.7 mmol/L (ref 3.5–5.1)
Sodium: 135 mmol/L (ref 135–145)
TOTAL PROTEIN: 6.6 g/dL (ref 6.5–8.1)

## 2017-10-17 LAB — URINALYSIS, ROUTINE W REFLEX MICROSCOPIC
BILIRUBIN URINE: NEGATIVE
Bacteria, UA: NONE SEEN
Glucose, UA: NEGATIVE mg/dL
KETONES UR: NEGATIVE mg/dL
LEUKOCYTES UA: NEGATIVE
NITRITE: NEGATIVE
PH: 5 (ref 5.0–8.0)
Protein, ur: NEGATIVE mg/dL
SQUAMOUS EPITHELIAL / LPF: NONE SEEN
Specific Gravity, Urine: 1.01 (ref 1.005–1.030)

## 2017-10-17 LAB — PROTIME-INR
INR: 1.17
Prothrombin Time: 14.8 seconds (ref 11.4–15.2)

## 2017-10-17 LAB — CBC WITH DIFFERENTIAL/PLATELET
BASOS PCT: 0 %
Basophils Absolute: 0 10*3/uL (ref 0.0–0.1)
EOS ABS: 0.2 10*3/uL (ref 0.0–0.7)
EOS PCT: 2 %
HCT: 33.7 % — ABNORMAL LOW (ref 39.0–52.0)
HEMOGLOBIN: 10.7 g/dL — AB (ref 13.0–17.0)
Lymphocytes Relative: 15 %
Lymphs Abs: 1.5 10*3/uL (ref 0.7–4.0)
MCH: 26.7 pg (ref 26.0–34.0)
MCHC: 31.8 g/dL (ref 30.0–36.0)
MCV: 84 fL (ref 78.0–100.0)
MONOS PCT: 11 %
Monocytes Absolute: 1.1 10*3/uL — ABNORMAL HIGH (ref 0.1–1.0)
NEUTROS PCT: 72 %
Neutro Abs: 7.1 10*3/uL (ref 1.7–7.7)
Platelets: 235 10*3/uL (ref 150–400)
RBC: 4.01 MIL/uL — ABNORMAL LOW (ref 4.22–5.81)
RDW: 14.2 % (ref 11.5–15.5)
WBC: 9.9 10*3/uL (ref 4.0–10.5)

## 2017-10-17 LAB — I-STAT CG4 LACTIC ACID, ED
LACTIC ACID, VENOUS: 1.48 mmol/L (ref 0.5–1.9)
Lactic Acid, Venous: 1.21 mmol/L (ref 0.5–1.9)

## 2017-10-17 LAB — TROPONIN I

## 2017-10-17 LAB — BRAIN NATRIURETIC PEPTIDE: B Natriuretic Peptide: 34 pg/mL (ref 0.0–100.0)

## 2017-10-17 MED ORDER — INSULIN GLARGINE 100 UNIT/ML ~~LOC~~ SOLN: 15.0000 [IU] | Freq: Every day | SUBCUTANEOUS | Status: DC

## 2017-10-17 MED ORDER — ONDANSETRON HCL 4 MG PO TABS: 4.0000 mg | ORAL_TABLET | Freq: Four times a day (QID) | ORAL | Status: DC | PRN

## 2017-10-17 MED ORDER — DILTIAZEM HCL ER COATED BEADS 120 MG PO CP24
120.0000 mg | ORAL_CAPSULE | Freq: Every day | ORAL | Status: DC
  Administered 2017-10-18 – 2017-10-24 (×7): 120 mg via ORAL
  Filled 2017-10-17 (×7): qty 1

## 2017-10-17 MED ORDER — BACLOFEN 10 MG PO TABS
20.0000 mg | ORAL_TABLET | Freq: Three times a day (TID) | ORAL | Status: DC
  Administered 2017-10-18 – 2017-10-24 (×20): 20 mg via ORAL
  Filled 2017-10-17 (×21): qty 2

## 2017-10-17 MED ORDER — HEPARIN SODIUM (PORCINE) 5000 UNIT/ML IJ SOLN
5000.0000 [IU] | Freq: Three times a day (TID) | INTRAMUSCULAR | Status: DC
  Administered 2017-10-18 – 2017-10-24 (×19): 5000 [IU] via SUBCUTANEOUS
  Filled 2017-10-17 (×20): qty 1

## 2017-10-17 MED ORDER — ACETAMINOPHEN 325 MG PO TABS: 650.0000 mg | ORAL_TABLET | Freq: Four times a day (QID) | ORAL | Status: DC | PRN

## 2017-10-17 MED ORDER — LEVALBUTEROL HCL 0.63 MG/3ML IN NEBU
0.6300 mg | INHALATION_SOLUTION | Freq: Four times a day (QID) | RESPIRATORY_TRACT | Status: DC | PRN
  Administered 2017-10-18: 0.63 mg via RESPIRATORY_TRACT
  Filled 2017-10-17: qty 3

## 2017-10-17 MED ORDER — LEVALBUTEROL HCL 0.63 MG/3ML IN NEBU
0.6300 mg | INHALATION_SOLUTION | Freq: Four times a day (QID) | RESPIRATORY_TRACT | Status: DC
  Administered 2017-10-18 – 2017-10-24 (×27): 0.63 mg via RESPIRATORY_TRACT
  Filled 2017-10-17 (×26): qty 3

## 2017-10-17 MED ORDER — IPRATROPIUM BROMIDE 0.02 % IN SOLN
0.5000 mg | Freq: Four times a day (QID) | RESPIRATORY_TRACT | Status: DC
  Administered 2017-10-18 – 2017-10-24 (×27): 0.5 mg via RESPIRATORY_TRACT
  Filled 2017-10-17 (×26): qty 2.5

## 2017-10-17 MED ORDER — INSULIN GLARGINE 100 UNIT/ML ~~LOC~~ SOLN
20.0000 [IU] | Freq: Every day | SUBCUTANEOUS | Status: DC
  Administered 2017-10-18: 20 [IU] via SUBCUTANEOUS
  Filled 2017-10-17 (×3): qty 0.2

## 2017-10-17 MED ORDER — LEVOFLOXACIN IN D5W 750 MG/150ML IV SOLN
750.0000 mg | Freq: Once | INTRAVENOUS | Status: DC
  Filled 2017-10-17: qty 150

## 2017-10-17 MED ORDER — LEVALBUTEROL HCL 0.63 MG/3ML IN NEBU
0.6300 mg | INHALATION_SOLUTION | Freq: Once | RESPIRATORY_TRACT | Status: AC
  Administered 2017-10-17: 0.63 mg via RESPIRATORY_TRACT
  Filled 2017-10-17: qty 3

## 2017-10-17 MED ORDER — GUAIFENESIN 100 MG/5ML PO SOLN
400.0000 mg | ORAL | Status: DC
  Administered 2017-10-18 – 2017-10-20 (×13): 400 mg via ORAL
  Filled 2017-10-17: qty 20
  Filled 2017-10-17: qty 5
  Filled 2017-10-17: qty 20
  Filled 2017-10-17: qty 15
  Filled 2017-10-17: qty 5
  Filled 2017-10-17 (×2): qty 20
  Filled 2017-10-17: qty 5
  Filled 2017-10-17 (×4): qty 20

## 2017-10-17 MED ORDER — BUDESONIDE 0.25 MG/2ML IN SUSP
0.2500 mg | Freq: Two times a day (BID) | RESPIRATORY_TRACT | Status: DC
  Administered 2017-10-18 – 2017-10-24 (×14): 0.25 mg via RESPIRATORY_TRACT
  Filled 2017-10-17 (×14): qty 2

## 2017-10-17 MED ORDER — MONTELUKAST SODIUM 10 MG PO TABS
10.0000 mg | ORAL_TABLET | Freq: Every day | ORAL | Status: DC
  Administered 2017-10-18 – 2017-10-23 (×7): 10 mg via ORAL
  Filled 2017-10-17 (×7): qty 1

## 2017-10-17 MED ORDER — NITROGLYCERIN 0.4 MG SL SUBL: 0.4000 mg | SUBLINGUAL_TABLET | SUBLINGUAL | Status: DC | PRN

## 2017-10-17 MED ORDER — OXYCODONE-ACETAMINOPHEN 5-325 MG PO TABS
2.0000 | ORAL_TABLET | ORAL | Status: DC | PRN
  Administered 2017-10-18 – 2017-10-24 (×31): 2 via ORAL
  Filled 2017-10-17 (×31): qty 2

## 2017-10-17 MED ORDER — BENZONATATE 100 MG PO CAPS
200.0000 mg | ORAL_CAPSULE | Freq: Three times a day (TID) | ORAL | Status: DC
  Administered 2017-10-18 – 2017-10-24 (×20): 200 mg via ORAL
  Filled 2017-10-17 (×20): qty 2

## 2017-10-17 MED ORDER — FUROSEMIDE 40 MG PO TABS
40.0000 mg | ORAL_TABLET | Freq: Every day | ORAL | Status: DC
  Administered 2017-10-18: 40 mg via ORAL
  Filled 2017-10-17 (×2): qty 1

## 2017-10-17 MED ORDER — LORAZEPAM 0.5 MG PO TABS
0.5000 mg | ORAL_TABLET | Freq: Four times a day (QID) | ORAL | Status: DC | PRN
  Administered 2017-10-18 (×3): 0.5 mg via ORAL
  Filled 2017-10-17 (×3): qty 1

## 2017-10-17 MED ORDER — MORPHINE SULFATE (PF) 2 MG/ML IV SOLN
2.0000 mg | Freq: Once | INTRAVENOUS | Status: AC
  Administered 2017-10-17: 2 mg via INTRAVENOUS
  Filled 2017-10-17: qty 1

## 2017-10-17 MED ORDER — ONDANSETRON HCL 4 MG/2ML IJ SOLN: 4.0000 mg | Freq: Four times a day (QID) | INTRAMUSCULAR | Status: DC | PRN

## 2017-10-17 MED ORDER — FUROSEMIDE 10 MG/ML IJ SOLN
20.0000 mg | Freq: Once | INTRAMUSCULAR | Status: AC
  Administered 2017-10-18: 20 mg via INTRAVENOUS
  Filled 2017-10-17: qty 2

## 2017-10-17 MED ORDER — INSULIN ASPART 100 UNIT/ML ~~LOC~~ SOLN
0.0000 [IU] | Freq: Three times a day (TID) | SUBCUTANEOUS | Status: DC
  Administered 2017-10-18: 9 [IU] via SUBCUTANEOUS
  Administered 2017-10-18 (×2): 5 [IU] via SUBCUTANEOUS
  Administered 2017-10-19: 3 [IU] via SUBCUTANEOUS
  Administered 2017-10-19: 5 [IU] via SUBCUTANEOUS
  Administered 2017-10-19: 7 [IU] via SUBCUTANEOUS
  Administered 2017-10-20 (×2): 3 [IU] via SUBCUTANEOUS
  Administered 2017-10-20 – 2017-10-21 (×2): 9 [IU] via SUBCUTANEOUS
  Administered 2017-10-21: 5 [IU] via SUBCUTANEOUS
  Administered 2017-10-21: 3 [IU] via SUBCUTANEOUS
  Administered 2017-10-22: 2 [IU] via SUBCUTANEOUS
  Administered 2017-10-22: 7 [IU] via SUBCUTANEOUS
  Administered 2017-10-22: 5 [IU] via SUBCUTANEOUS
  Administered 2017-10-23: 2 [IU] via SUBCUTANEOUS
  Administered 2017-10-23: 1 [IU] via SUBCUTANEOUS
  Administered 2017-10-23: 7 [IU] via SUBCUTANEOUS
  Administered 2017-10-24: 1 [IU] via SUBCUTANEOUS
  Administered 2017-10-24: 2 [IU] via SUBCUTANEOUS

## 2017-10-17 MED ORDER — ACETAMINOPHEN 650 MG RE SUPP: 650.0000 mg | Freq: Four times a day (QID) | RECTAL | Status: DC | PRN

## 2017-10-17 MED ORDER — METHYLPREDNISOLONE SODIUM SUCC 40 MG IJ SOLR
40.0000 mg | Freq: Every day | INTRAMUSCULAR | Status: DC
  Administered 2017-10-18 – 2017-10-20 (×4): 40 mg via INTRAVENOUS
  Filled 2017-10-17 (×5): qty 1

## 2017-10-17 MED ORDER — PANTOPRAZOLE SODIUM 40 MG PO TBEC
40.0000 mg | DELAYED_RELEASE_TABLET | Freq: Every day | ORAL | Status: DC
  Administered 2017-10-18 – 2017-10-24 (×7): 40 mg via ORAL
  Filled 2017-10-17 (×7): qty 1

## 2017-10-17 NOTE — ED Triage Notes (Addendum)
Pt reports having a deep cough that is causing his sharp pain in his his chest. Pt is coughing up dark brown and green phlegm.Pt has had the flu and pneumonia recently. Pt also bilateral lower leg edema and right arm pain. Pt states he has felt dizzy, sob, weak and light headed. Pt reports this has gotten worse today but symptoms have increased today. Pt is taking oxycodone without relief.

## 2017-10-17 NOTE — ED Notes (Addendum)
EDP Nanavati made aware of this pt ans his vitals. He said to initiate possible sepsis order but not code stroke orders.   Dr. Kathrynn Humble also aware that the pt said he had the close cousin of tb. Dr. Tammy Sours stated that the pt did not have TB and did not to be on any precautions.

## 2017-10-17 NOTE — ED Provider Notes (Signed)
[facility] EMERGENCY DEPARTMENT Provider Note   CSN: [id number] Arrival date & time: 10/17/17  1954     History   Chief Complaint Chief Complaint  Patient presents with  . Shortness of Breath    HPI Ryan [name] is a 57 y.o. male.  HPI  56 year old male with history of A. fib not on any anticoagulation, COPD on 6 L oxygen, recent admission for flu and pneumonia, diabetes and CAD comes in with chief complaint of shortness of breath.  Patient states that his shortness of breath started getting worse about 2 days ago.  Patient now is short of breath even at rest.  He has also noted increased leg swelling.  Patient has chest pain all over with deep inspiration.  Patient's cough is producing dark green and brown phlegm.  Patient denies any fevers or chills.  Past Medical History:  Diagnosis Date  . Atrial fibrillation ([place])    Not anticoagulated  . Barrett's esophagus   . Borderline diabetes   . Bronchitis   . CAP (community acquired pneumonia) 07/10/2013   04/2015  . 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 Chronic back pain   . Chronic left shoulder pain   . Chronic neck pain   . Chronic respiratory failure ([place])   . Collagen vascular disease ([place])   . COPD (chronic obstructive pulmonary disease) ([place])   . Coronary atherosclerosis of native coronary artery    Mild nonobstructive disease at catheterization 2007  . DDD (degenerative disc disease)    Cervical and thoracic  . GERD (gastroesophageal reflux disease)   . History of pneumonia   . Hypercholesteremia   . Polycythemia   . Seizures ([place])    Last seizure 2 yrs ago  . Smoker   . Type 2 diabetes mellitus ([place])   . Type 2 diabetes mellitus without complication [facility])     Patient Active Problem List   Diagnosis Date Noted  . Influenza B 09/21/2017  . MAI (mycobacterium avium-intracellulare) ([place]) 08/29/2015  . Edema   . Anemia of chronic disease   . Hypokalemia 08/22/2015  .  Elevated liver enzymes   . Absolute anemia   . COPD (chronic obstructive pulmonary disease) ([place]) 08/17/2015  . Sepsis ([place]) 07/26/2015  . Lobar pneumonia ([place]) 07/26/2015  . Pulmonary fibrosis ([place]) 05/10/2015  . Chronic respiratory failure with hypoxia ([place]) 05/10/2015  . Chronic atrial fibrillation ([place])   . Foot pain, bilateral   . Pulmonary nodule   . Pain, generalized 05/01/2015  . Laceration 05/01/2015  . Paroxysmal atrial fibrillation (HCC)   . Chronic respiratory failure ([place])   . Hyperglycemia 09/08/2014  . A-fib ([place]) 09/08/2014  . Diabetes ([place]) 05/29/2014  . Protein-calorie malnutrition, severe ([place]) 10/21/2013  . Hemoptysis 10/20/2013  . Atrial fibrillation with rapid ventricular response ([place]) 07/10/2013  . Shoulder pain, left 01/20/2012  . Rotator cuff syndrome of left shoulder 01/20/2012  . Spondylosis, cervical 11/19/2011  . Shoulder pain 11/19/2011  . 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 Cavitary lesion of lung 05/07/2011  . GERD with stricture   . ASCVD (arteriosclerotic cardiovascular disease)   . Type 2 diabetes mellitus without complication ([place])   . TOBACCO ABUSE 05/17/2010  . Asthma 01/20/2008  . Hyperlipidemia 12/27/2007  . 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 COLONOSCOPY  2012   Dr. Posey Pronto: normal  .  ESOPHAGOGASTRODUODENOSCOPY N/A 05/04/2015   Dr. Michail Sermon: minimal erosive esophagitis, small hiatal hernia. FOOD PRECLUDED A COMPLETE EXAM. No biopsies taken  . ESOPHAGOGASTRODUODENOSCOPY  2011   [place]: Barrett's   . LUNG BIOPSY    . Throat biopsy    . VASECTOMY    . VASECTOMY  1987        Home Medications    Prior to Admission medications   Medication Sig Start Date End Date Taking? Authorizing Provider  albuterol (VENTOLIN HFA) 108 (90 BASE) MCG/ACT inhaler Inhale 2 puffs into the lungs every 6 (six) hours as needed for wheezing or  shortness of breath.    [provider]  azithromycin (ZITHROMAX Z-PAK) 250 MG tablet Take by package instructions 09/23/17   [name], MD  baclofen (LIORESAL) 20 MG tablet Take 20 mg by mouth 3 (three) times daily.  06/05/15   [provider]  benzonatate (TESSALON) 200 MG capsule Take 200 mg by mouth 3 (three) times daily.    [provider]  CARTIA XT 120 MG 24 hr capsule Take 120 mg by mouth daily.  10/09/16   [provider]  Fluticasone-Umeclidin-Vilant (TRELEGY ELLIPTA) 100-62.5-25 MCG/INH AEPB Inhale 1 puff into the lungs daily. Rinse mouth after you use this 09/23/17   [name], MD  insulin glargine (LANTUS) 100 UNIT/ML injection Inject 0.16 mLs (16 Units total) into the skin daily. 10/04/17   [name], MD  insulin lispro (HUMALOG) 100 UNIT/ML injection Take by sliding scale provided by hospital maximum amount daily is 60 units.  Check blood sugar 4 times a day 09/23/17   [name], MD  ipratropium (ATROVENT) 0.02 % nebulizer solution VVN TID WITH LEVALBUTEROL 09/29/17   [provider]  levalbuterol Penne Lash) 0.63 MG/3ML nebulizer solution Take 3 mLs (0.63 mg total) by nebulization every 8 (eight) hours as needed for wheezing or shortness of breath. 09/05/15   [name], MD  LORazepam (ATIVAN) 0.5 MG tablet Take 1 tablet (0.5 mg total) by mouth 2 (two) times daily as needed for anxiety or sleep. Patient taking differently: Take 0.5 mg by mouth 4 (four) times daily as needed for anxiety or sleep.  05/04/15   Cristal Ford, DO  metFORMIN (GLUCOPHAGE) 500 MG tablet Take 1 tablet (500 mg total) by mouth daily with breakfast. 09/15/14   Rexene Alberts, MD  montelukast (SINGULAIR) 10 MG tablet Take 10 mg by mouth at bedtime.    [provider]  nitroGLYCERIN (NITROSTAT) 0.4 MG SL tablet Place 1 tablet (0.4 mg total) under the tongue every 5 (five) minutes as needed for chest pain. 04/22/13   Lendon Colonel, NP    omeprazole (PRILOSEC) 20 MG capsule Take 20 mg by mouth daily.    [provider]  oxyCODONE-acetaminophen (PERCOCET) 10-325 MG tablet Take 1 tablet by mouth every 4 (four) hours as needed for pain.    [provider]  predniSONE (STERAPRED UNI-PAK 21 TAB) 10 MG (21) TBPK tablet Take by package instructions 09/23/17   [name], MD    Family History Family History  Problem Relation Age of Onset  . Hypertension Mother   . Diabetes Mother   . Heart attack Mother   . Heart failure Mother   . Hypertension Sister   . Diabetes Sister   . Heart failure Sister   . Colon cancer Neg Hx     Social History Social History   Tobacco Use  . Smoking status: Current Every Day Smoker    Packs/day: 0.50  Years: 45.00    Pack years: 22.50    Types: Cigarettes    Last attempt to quit: 05/01/2015    Years since quitting: 2.4  . Smokeless tobacco: Never Used  Substance Use Topics  . Alcohol use: No    Alcohol/week: 0.0 oz  . Drug use: No     Allergies   Albuterol; Ciprofloxacin; and Influenza vaccine live   Review of Systems Review of Systems  Constitutional: Positive for activity change.  Respiratory: Positive for cough and shortness of breath.   Cardiovascular: Positive for chest pain.  Hematological: Does not bruise/bleed easily.  All other systems reviewed and are negative.    Physical Exam Updated Vital Signs BP 120/84   Pulse (!) 103   Temp 98.4 F (36.9 C) (Oral)   Resp (!) 22   Ht 5\' 8"  (1.727 m)   Wt 65.9 kg (145 lb 4.5 oz)   SpO2 91%   BMI 22.09 kg/m   Physical Exam  Constitutional: He is oriented to person, place, and time. He appears well-developed. He appears distressed.  HENT:  Head: Atraumatic.  Neck: Neck supple.  Cardiovascular: Normal rate.  Pulmonary/Chest: Breath sounds normal. He is in respiratory distress. He has no rhonchi. He has no rales.  Poor aeration diffusely  Neurological: He is alert and oriented to person,  place, and time.  Skin: Skin is warm.  Nursing note and vitals reviewed.    ED Treatments / Results  Labs (all labs ordered are listed, but only abnormal results are displayed) Labs Reviewed  CBC WITH DIFFERENTIAL/PLATELET - Abnormal; Notable for the following components:      Result Value   RBC 4.01 (*)    Hemoglobin 10.7 (*)    HCT 33.7 (*)    Monocytes Absolute 1.1 (*)    All other components within normal limits  CULTURE, BLOOD (ROUTINE X 2)  CULTURE, BLOOD (ROUTINE X 2)  PROTIME-INR  COMPREHENSIVE METABOLIC PANEL  URINALYSIS, ROUTINE W REFLEX MICROSCOPIC  BRAIN NATRIURETIC PEPTIDE  TROPONIN I  I-STAT CG4 LACTIC ACID, ED    EKG EKG Interpretation  Date/Time:  Saturday October 17 2017 20:24:23 EDT Ventricular Rate:  105 PR Interval:    QRS Duration: 80 QT Interval:  320 QTC Calculation: 423 R Axis:   62 Text Interpretation:  Sinus tachycardia No acute changes Nonspecific ST and T wave abnormality Confirmed by Varney Biles [phone]) on 10/17/2017 8:51:44 PM   Radiology No results found.  Procedures Procedures (including critical care time)  Medications Ordered in ED Medications  levalbuterol (XOPENEX) nebulizer solution 0.63 mg (has no administration in time range)     Initial Impression / Assessment and Plan / ED Course  I have reviewed the triage vital signs and the nursing notes.  Pertinent labs & imaging results that were available during my care of the patient were reviewed by me and considered in my medical decision making (see chart for details).     57 year old male comes in with chief complaint of shortness of breath.  Patient has multiple medical comorbidities, most notably he has advanced COPD and a recent admission for pneumonia and influenza.  Patient also has CAD history and cavitary lesions to the lungs.  On my exam patient has poor aeration and he appears slightly distressed due to his tachypnea.  Patient also noted to drop his O2 sats when  he coughs.  Patient also has bilateral leg swelling and edema, right worse than left.  Differential diagnosis includes COPD exacerbation, DVT/PE, CHF.  Patient denies fevers, he does not have any focal rhonchi -but he does have dark colored phlegm.  Questionable pneumonia -basic labs and chest x-ray ordered at this time.  We will start Xopenex nebulizer and assess.  Final Clinical Impressions(s) / ED Diagnoses   Final diagnoses:  Acute on chronic respiratory failure with hypoxia [facility])  COPD with acute exacerbation [facility])    ED Discharge Orders    None       Varney Biles, MD 10/17/17 2103

## 2017-10-17 NOTE — ED Notes (Signed)
Pt returned from xray,  

## 2017-10-17 NOTE — ED Notes (Signed)
Dr Delane Ginger in to assess

## 2017-10-18 ENCOUNTER — Encounter (HOSPITAL_COMMUNITY): Payer: Self-pay | Admitting: *Deleted

## 2017-10-18 ENCOUNTER — Inpatient Hospital Stay (HOSPITAL_COMMUNITY): Payer: Medicare Other

## 2017-10-18 ENCOUNTER — Other Ambulatory Visit: Payer: Self-pay

## 2017-10-18 DIAGNOSIS — J9601 Acute respiratory failure with hypoxia: Secondary | ICD-10-CM

## 2017-10-18 DIAGNOSIS — R600 Localized edema: Secondary | ICD-10-CM

## 2017-10-18 DIAGNOSIS — I340 Nonrheumatic mitral (valve) insufficiency: Secondary | ICD-10-CM

## 2017-10-18 DIAGNOSIS — J441 Chronic obstructive pulmonary disease with (acute) exacerbation: Principal | ICD-10-CM

## 2017-10-18 LAB — CBC
HCT: 32.9 % — ABNORMAL LOW (ref 39.0–52.0)
Hemoglobin: 10.3 g/dL — ABNORMAL LOW (ref 13.0–17.0)
MCH: 26.5 pg (ref 26.0–34.0)
MCHC: 31.3 g/dL (ref 30.0–36.0)
MCV: 84.8 fL (ref 78.0–100.0)
PLATELETS: 246 10*3/uL (ref 150–400)
RBC: 3.88 MIL/uL — ABNORMAL LOW (ref 4.22–5.81)
RDW: 14.3 % (ref 11.5–15.5)
WBC: 9 10*3/uL (ref 4.0–10.5)

## 2017-10-18 LAB — GLUCOSE, CAPILLARY
GLUCOSE-CAPILLARY: 362 mg/dL — AB (ref 65–99)
GLUCOSE-CAPILLARY: 254 mg/dL — AB (ref 65–99)
Glucose-Capillary: 275 mg/dL — ABNORMAL HIGH (ref 65–99)
Glucose-Capillary: 275 mg/dL — ABNORMAL HIGH (ref 65–99)

## 2017-10-18 LAB — BASIC METABOLIC PANEL
Anion gap: 15 (ref 5–15)
BUN: 21 mg/dL — AB (ref 6–20)
CO2: 28 mmol/L (ref 22–32)
CREATININE: 1.15 mg/dL (ref 0.61–1.24)
Calcium: 8.5 mg/dL — ABNORMAL LOW (ref 8.9–10.3)
Chloride: 91 mmol/L — ABNORMAL LOW (ref 101–111)
GFR calc Af Amer: >60 mL/min (ref >60)
Glucose, Bld: 308 mg/dL — ABNORMAL HIGH (ref 65–99)
POTASSIUM: 4 mmol/L (ref 3.5–5.1)
SODIUM: 134 mmol/L — AB (ref 135–145)

## 2017-10-18 LAB — ECHOCARDIOGRAM COMPLETE
Height: 68 in
Weight: 2395.08 oz

## 2017-10-18 LAB — EXPECTORATED SPUTUM ASSESSMENT W GRAM STAIN, RFLX TO RESP C

## 2017-10-18 LAB — EXPECTORATED SPUTUM ASSESSMENT W REFEX TO RESP CULTURE

## 2017-10-18 LAB — D-DIMER, QUANTITATIVE: D-Dimer, Quant: 1.78 ug/mL-FEU — ABNORMAL HIGH (ref 0.00–0.50)

## 2017-10-18 MED ORDER — METOPROLOL TARTRATE 5 MG/5ML IV SOLN
10.0000 mg | Freq: Once | INTRAVENOUS | Status: DC
  Filled 2017-10-18 (×2): qty 10

## 2017-10-18 MED ORDER — LEVOFLOXACIN IN D5W 500 MG/100ML IV SOLN
500.0000 mg | Freq: Once | INTRAVENOUS | Status: AC
  Administered 2017-10-18: 500 mg via INTRAVENOUS
  Filled 2017-10-18: qty 100

## 2017-10-18 MED ORDER — LEVOFLOXACIN IN D5W 500 MG/100ML IV SOLN
500.0000 mg | INTRAVENOUS | Status: DC
  Administered 2017-10-19 – 2017-10-22 (×4): 500 mg via INTRAVENOUS
  Filled 2017-10-18 (×4): qty 100

## 2017-10-18 MED ORDER — INSULIN GLARGINE 100 UNIT/ML ~~LOC~~ SOLN
20.0000 [IU] | Freq: Every day | SUBCUTANEOUS | Status: DC
  Administered 2017-10-18 – 2017-10-20 (×3): 20 [IU] via SUBCUTANEOUS
  Filled 2017-10-18 (×3): qty 0.2

## 2017-10-18 NOTE — Progress Notes (Signed)
**Note De-identified Elleni Mozingo Obfuscation** EKG complete RN notified  and placed in patient chart.  

## 2017-10-18 NOTE — Progress Notes (Signed)
  Echocardiogram 2D Echocardiogram has been performed.  Extremely difficult parasternal views due to patient lung interference. Patient experienced severe coughing spells during exam.  Ryan [name] L Androw 10/18/2017, 10:23 AM

## 2017-10-18 NOTE — Progress Notes (Signed)
Called in to patient's room d/t elevated HR and patient not feeling well. When I assessed patient his HR was in the 120s-130s. He stated that he had just gotten up to the bathroom without his O2 and was not feeling well, I instructed patient to rest and take some deep breaths. I gave patient his PRN Ativan and at this time patient HR was down to the 110's. Dr. Jerilee Hoh notified and no changes were made.

## 2017-10-18 NOTE — Progress Notes (Signed)
Pharmacy Antibiotic Note  Ryan [name] is a 57 y.o. male admitted on 10/17/2017 with pneumonia.  Pharmacy has been consulted for Levaquin dosing. 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 MD to discuss with ID.  Plan: Levaquin 575m IV q24h F/u clinical progress Monitor V/S, labs F/u plan  Height: _0  (172.7 cm) Weight: 149 lb 11.1 oz (67.9 kg) IBW/kg (Calculated) : 68.4  Temp (24hrs), Avg:98.2 F (36.8 C), Min:97.9 F (36.6 C), Max:98.4 F (36.9 C)  Recent Labs  Lab 10/17/17 2037 10/17/17 2216 10/18/17 0612  WBC 9.9  --  9.0  CREATININE 1.07  --  1.15  LATICACIDVEN 1.21 1.48  --     Estimated Creatinine Clearance: 67.2 mL/min (by C-G formula based on SCr of 1.15 mg/dL).    Allergies  Allergen Reactions  . Albuterol Palpitations  . 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 Influenza Vaccine Live Swelling    Antimicrobials this admission: Levaquin 4/6 (on PTA) >>   Dose adjustments this admission: n/a  Microbiology results: 4/6 BCx: pending 4/7 Sputum: pending 3/23 MRSA PCR: negative 3/9  AFB + M avium complex-> sensitivities pending Thank you for allowing pharmacy to be a part of this patient's care.  LIsac Sarna BS Pharm D, [place]Clinical Pharmacist Pager #[phone]/01/2018 8:51 AM

## 2017-10-18 NOTE — H&P (Signed)
History and Physical    Ryan [name] ACZ:[id number] DOB: 04-27-1961 DOA: 10/17/2017  PCP: [name], MD  Patient coming from: Home.  Chief Complaint: Lower extremity edema and shortness of breath.  HPI: Ryan [name] is a 57 y.o. male with history of COPD Gold stage III, pulmonary fibrosis on home oxygen 6 L, paroxysmal atrial fibrillation, cavitary lesion in the lung, diabetes mellitus type 2 presents to the ER with complaints of increasing lower extremity edema and shortness of breath.  Patient has been having increasing lower extremity edema over the last 2 days.  Patient states during previous admission a few days ago patient noticed some edema in his PCP had given Lasix which he took for 2 days.  Following which his edema decreased and he took off the Lasix.  Patient had gone to his PCP last week for increasing shortness of breath and cough and was prescribed Levaquin.  Last 2 days he noticed increasing cough shortness of breath and lower extremity edema.  Lower extremity edema is more on the right leg.  ED Course: In the ER chest x-ray does not show anything acute.  On exam patient has bilateral lower extremity edema pitting.  More on the right side.  Patient also is short of breath with tight lungs.  BNP is 34 and troponin less than 0.03.  EKG shows sinus tachycardia.  Hemoglobin around 10.  Patient has been having some blood-tinged sputum last 2 days.  Patient admitted for acute respiratory failure likely from COPD/CHF.  Will need to rule out DVT in the lower extremity.  Review of Systems: As per HPI, rest all negative.   Past Medical History:  Diagnosis Date  . Atrial fibrillation ([place])    Not anticoagulated  . Barrett's esophagus   . Borderline diabetes   . Bronchitis   . CAP (community acquired pneumonia) 07/10/2013   04/2015  . 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 Chronic back pain   . Chronic left shoulder pain   . Chronic neck  pain   . Chronic respiratory failure ([place])   . Collagen vascular disease ([place])   . COPD (chronic obstructive pulmonary disease) ([place])   . Coronary atherosclerosis of native coronary artery    Mild nonobstructive disease at catheterization 2007  . DDD (degenerative disc disease)    Cervical and thoracic  . GERD (gastroesophageal reflux disease)   . History of pneumonia   . Hypercholesteremia   . Polycythemia   . Seizures ([place])    Last seizure 2 yrs ago  . Smoker   . Type 2 diabetes mellitus ([place])   . Type 2 diabetes mellitus without complication [facility])     Past Surgical History:  Procedure Laterality Date  . COLONOSCOPY  2012   Dr. Posey Pronto: normal  . ESOPHAGOGASTRODUODENOSCOPY N/A 05/04/2015   Dr. Michail Sermon: minimal erosive esophagitis, small hiatal hernia. FOOD PRECLUDED A COMPLETE EXAM. No biopsies taken  . ESOPHAGOGASTRODUODENOSCOPY  2011   [place]: Barrett's   . LUNG BIOPSY    . Throat biopsy    . VASECTOMY    . VASECTOMY  1987     reports that he has been smoking cigarettes.  He has a 22.50 pack-year smoking history. He has never used smokeless tobacco. He reports that he does not drink alcohol or use drugs.  Allergies  Allergen Reactions  . Albuterol Palpitations  . Ciprofloxacin Other (See Comments)    Patient experiences vision loss from long-term  and short-term use  . Influenza Vaccine Live Swelling    Family History  Problem Relation Age of Onset  . Hypertension Mother   . Diabetes Mother   . Heart attack Mother   . Heart failure Mother   . Hypertension Sister   . Diabetes Sister   . Heart failure Sister   . Colon cancer Neg Hx     Prior to Admission medications   Medication Sig Start Date End Date Taking? Authorizing Provider  albuterol (VENTOLIN HFA) 108 (90 BASE) MCG/ACT inhaler Inhale 2 puffs into the lungs every 6 (six) hours as needed for wheezing or shortness of breath.   Yes [provider]  azithromycin (ZITHROMAX Z-PAK) 250 MG tablet  Take by package instructions 09/23/17  Yes [name], MD  baclofen (LIORESAL) 20 MG tablet Take 20 mg by mouth 3 (three) times daily.  06/05/15  Yes [provider]  benzonatate (TESSALON) 200 MG capsule Take 200 mg by mouth 3 (three) times daily.   Yes [provider]  CARTIA XT 120 MG 24 hr capsule Take 120 mg by mouth daily.  10/09/16  Yes [provider]  Fluticasone-Umeclidin-Vilant (TRELEGY ELLIPTA) 100-62.5-25 MCG/INH AEPB Inhale 1 puff into the lungs daily. Rinse mouth after you use this 09/23/17  Yes [name], MD  furosemide (LASIX) 40 MG tablet Take 40 mg by mouth daily.   Yes [provider]  guaifenesin (HUMIBID E) 400 MG TABS tablet Take 400 mg by mouth every 4 (four) hours.   Yes [provider]  HUMALOG KWIKPEN 100 UNIT/ML KiwkPen  09/24/17  Yes [provider]  insulin glargine (LANTUS) 100 UNIT/ML injection Inject 0.16 mLs (16 Units total) into the skin daily. 10/04/17  Yes [name], MD  insulin lispro (HUMALOG) 100 UNIT/ML injection Take by sliding scale provided by hospital maximum amount daily is 60 units.  Check blood sugar 4 times a day 09/23/17  Yes [name], MD  ipratropium (ATROVENT) 0.02 % nebulizer solution VVN TID WITH LEVALBUTEROL 09/29/17  Yes [provider]  levalbuterol (XOPENEX) 0.63 MG/3ML nebulizer solution Take 3 mLs (0.63 mg total) by nebulization every 8 (eight) hours as needed for wheezing or shortness of breath. 09/05/15  Yes [name], MD  levofloxacin (LEVAQUIN) 500 MG tablet Take 1 tablet by mouth every other day. 10/11/17  Yes [provider]  LORazepam (ATIVAN) 0.5 MG tablet Take 1 tablet (0.5 mg total) by mouth 2 (two) times daily as needed for anxiety or sleep. Patient taking differently: Take 0.5 mg by mouth 4 (four) times daily as needed for anxiety or sleep.  05/04/15  Yes Mikhail, [place], DO  metFORMIN (GLUCOPHAGE) 500 MG tablet Take 1 tablet (500 mg  total) by mouth daily with breakfast. 09/15/14  Yes Rexene Alberts, MD  montelukast (SINGULAIR) 10 MG tablet Take 10 mg by mouth at bedtime.   Yes [provider]  omeprazole (PRILOSEC) 20 MG capsule Take 20 mg by mouth daily.   Yes [provider]  oxyCODONE-acetaminophen (PERCOCET) 10-325 MG tablet Take 1 tablet by mouth every 4 (four) hours as needed for pain.   Yes [provider]  nitroGLYCERIN (NITROSTAT) 0.4 MG SL tablet Place 1 tablet (0.4 mg total) under the tongue every 5 (five) minutes as needed for chest pain. 04/22/13   Lendon Colonel, NP  predniSONE (STERAPRED UNI-PAK 21 TAB) 10 MG (21) TBPK tablet Take by package instructions Patient not taking: Reported on 10/17/2017 09/23/17   [name], MD  Physical Exam: Vitals:   10/18/17 0132 10/18/17 0140 10/18/17 0436 10/18/17 0500  BP:    110/71  Pulse:    95  Resp:    20  Temp:    97.9 F (36.6 C)  TempSrc:    Oral  SpO2: 94% 94% 95% 96%  Weight:    67.9 kg (149 lb 11.1 oz)  Height:          Constitutional: Moderately built and nourished. Vitals:   10/18/17 0132 10/18/17 0140 10/18/17 0436 10/18/17 0500  BP:    110/71  Pulse:    95  Resp:    20  Temp:    97.9 F (36.6 C)  TempSrc:    Oral  SpO2: 94% 94% 95% 96%  Weight:    67.9 kg (149 lb 11.1 oz)  Height:       Eyes: Anicteric no pallor. ENMT: No discharge from the ears eyes nose or mouth. Neck: No mass felt.  No neck rigidity.  No JVD appreciated. Respiratory: Bilateral air entry present but appears tight. Cardiovascular: S1-S2 heard no murmurs appreciated. Abdomen: Soft nontender bowel sounds present. Musculoskeletal: Bilateral lower extremity edema present more on the right side.  Pitting. Skin: No rash. Neurologic: Alert awake oriented to time place and person.  Moves all extremities. Psychiatric: Appears normal.  Normal affect.   Labs on Admission: I have personally reviewed following labs and imaging  studies  CBC: Recent Labs  Lab 10/17/17 2037  WBC 9.9  NEUTROABS 7.1  HGB 10.7*  HCT 33.7*  MCV 84.0  PLT 810   Basic Metabolic Panel: Recent Labs  Lab 10/17/17 2037  NA 135  K 3.7  CL 94*  CO2 27  GLUCOSE 151*  BUN 19  CREATININE 1.07  CALCIUM 8.5*   GFR: Estimated Creatinine Clearance: 72.3 mL/min (by C-G formula based on SCr of 1.07 mg/dL). Liver Function Tests: Recent Labs  Lab 10/17/17 2037  AST 34  ALT 42  ALKPHOS 91  BILITOT 0.7  PROT 6.6  ALBUMIN 2.7*   No results for input(s): LIPASE, AMYLASE in the last 168 hours. No results for input(s): AMMONIA in the last 168 hours. Coagulation Profile: Recent Labs  Lab 10/17/17 2037  INR 1.17   Cardiac Enzymes: Recent Labs  Lab 10/17/17 2037  TROPONINI <0.03   BNP (last 3 results) No results for input(s): PROBNP in the last 8760 hours. HbA1C: No results for input(s): HGBA1C in the last 72 hours. CBG: No results for input(s): GLUCAP in the last 168 hours. Lipid Profile: No results for input(s): CHOL, HDL, LDLCALC, TRIG, CHOLHDL, LDLDIRECT in the last 72 hours. Thyroid Function Tests: No results for input(s): TSH, T4TOTAL, FREET4, T3FREE, THYROIDAB in the last 72 hours. Anemia Panel: No results for input(s): VITAMINB12, FOLATE, FERRITIN, TIBC, IRON, RETICCTPCT in the last 72 hours. Urine analysis:    Component Value Date/Time   COLORURINE YELLOW 10/17/2017 2205   APPEARANCEUR CLEAR 10/17/2017 2205   LABSPEC 1.010 10/17/2017 2205   PHURINE 5.0 10/17/2017 2205   GLUCOSEU NEGATIVE 10/17/2017 2205   HGBUR SMALL (A) 10/17/2017 2205   BILIRUBINUR NEGATIVE 10/17/2017 2205   KETONESUR NEGATIVE 10/17/2017 2205   PROTEINUR NEGATIVE 10/17/2017 2205   UROBILINOGEN 0.2 09/08/2014 1444   NITRITE NEGATIVE 10/17/2017 2205   LEUKOCYTESUR NEGATIVE 10/17/2017 2205   Sepsis Labs: @LABRCNTIP (procalcitonin:4,lacticidven:4) ) Recent Results (from the past 240 hour(s))  Culture, blood (Routine x 2)     Status:  None (Preliminary result)   Collection Time: 10/17/17  9:20 PM  Result Value Ref Range Status   Specimen Description RIGHT ANTECUBITAL  Final   Special Requests   Final    BOTTLES DRAWN AEROBIC AND ANAEROBIC Blood Culture adequate volume Performed at [facility], [address], [place], [place] [address]    Culture PENDING  Incomplete   Report Status PENDING  Incomplete     Radiological Exams on Admission: Dg Chest 2 View  Result Date: 10/17/2017 CLINICAL DATA:  Deep cough with chest pain EXAM: CHEST - 2 VIEW COMPARISON:  09/15/2017.  CT chest 09/01/2017. FINDINGS: Lungs are hyperexpanded. Chronic interstitial changes noted right lung base with bullous change in the right apex. Cavitary lesions again noted in the left mid and posterior lower lung. The cardiopericardial silhouette is within normal limits for size. The visualized bony structures of the thorax are intact. Telemetry leads overlie the chest. IMPRESSION: Marked hyperexpansion with cavitary lesions in the left mid and posterior lower lung, better seen on previous CT. Electronically Signed   By: Misty Stanley M.D.   On: 10/17/2017 21:22    EKG: Independently reviewed.  Sinus tachycardia.  Assessment/Plan Principal Problem:   Acute respiratory failure with hypoxia (HCC) Active Problems:   Type 2 diabetes mellitus without complication (HCC)   Paroxysmal atrial fibrillation (HCC)   Pulmonary fibrosis (HCC)   Anemia of chronic disease   Lower extremity edema   COPD with acute exacerbation ([place])    1. Acute respiratory failure with hypoxia likely a combination of COPD/pulmonary fibrosis with possible CHF likely right heart failure.  Patient has been taking Lasix last 2 days.  I have ordered 20 mg IV Lasix 1 dose now and continue with steroids nebulizer and Pulmicort.  Patient's last 2D echo done was in February 2017 which showed EF of 65-70% with moderate mitral regurgitation.  We will also check d-dimer.  If elevated will get a  CT angiogram of the chest.  Sputum cultures.  Patient was started on Levaquin by primary care physician last week which will be continued now. 2. Bilateral lower extremity edema more on the right side -we will order Dopplers to rule out DVT.  Patient received Lasix. 3. COPD -see #1. 4. History of pulmonary fibrosis. 5. Paroxysmal atrial fibrillation presently in sinus rhythm.  On Cardizem.  Not on anticoagulation exact reason not known. 6. Normocytic normochromic anemia -appears to be chronic.  Follow CBC. 7. Cavitary lesion in the lung appears to be chronic.  Was previously treated completely for MAI.   DVT prophylaxis: Heparin.  Closely watch out for any worsening hemoptysis. Code Status: Full code. Family Communication: Patient's family. Disposition Plan: Home. Consults called: None. Admission status: Inpatient.   Rise Patience MD [facility] Pager [phone].  If 7PM-7AM, please contact night-coverage [email or web address] Password [email or web address]  10/18/2017, 5:19 AM

## 2017-10-18 NOTE — Progress Notes (Signed)
ANTIBIOTIC CONSULT NOTE-Preliminary  Pharmacy Consult for Levofloxicin Indication: Bronchitis  Allergies  Allergen Reactions  . Albuterol Palpitations  . 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 Influenza Vaccine Live Swelling    Patient Measurements: Height: _0  (172.7 cm) Weight: 145 lb 4.5 oz (65.9 kg) IBW/kg (Calculated) : 68.4   Vital Signs: Temp: 98.4 F (36.9 C) (04/06 1958) Temp Source: Oral (04/06 1958) BP: 124/75 (04/06 2330) Pulse Rate: 107 (04/06 2330)  Labs: Recent Labs    10/17/17 2037  WBC 9.9  HGB 10.7*  PLT 235  CREATININE 1.07    Estimated Creatinine Clearance: 70.1 mL/min (by C-G formula based on SCr of 1.07 mg/dL).  No results for input(s): VANCOTROUGH, VANCOPEAK, VANCORANDOM, GENTTROUGH, GENTPEAK, GENTRANDOM, TOBRATROUGH, TOBRAPEAK, TOBRARND, AMIKACINPEAK, AMIKACINTROU, AMIKACIN in the last 72 hours.   Microbiology: Recent Results (from the past 720 hour(s))  Acid Fast Smear (AFB)     Status: None   Collection Time: 09/19/17  6:00 AM  Result Value Ref Range Status   AFB Specimen Processing Concentration  Final   Acid Fast Smear Negative  Final    Comment: (NOTE) Performed At: [facility] [address] [place], [place] [id number] Rush Farmer MD [id number]    Source (AFB) SPU  Final    Comment: Performed at [facility], [address], [place], [place] [address]  Acid Fast Culture with reflexed sensitivities     Status: Abnormal   Collection Time: 09/19/17  6:00 AM  Result Value Ref Range Status   Acid Fast Culture Positive (A)  Final    Comment: (NOTE) Acid-fast bacilli have been detected in culture at 1 week; see AFB Organism ID by DNA probe Notified Margaret.B on 10/13/17 @ 15:16 by DAP. Faxed to [phone]. Performed At: [facility] [place], [place] [id number] Rush Farmer MD [id number]    Source of Sample SPU   Final    Comment: Performed at [facility], [address] [place], [place] [address]  AFB Organism ID By DNA Probe     Status: Abnormal   Collection Time: 09/19/17  6:00 AM  Result Value Ref Range Status   M tuberculosis complex Negative  Final   M avium complex Positive (A)  Final    Comment: (NOTE) Notified Margaret.B on 10/13/17 @ 15:16 by DAP. Faxed to [phone].    M kansasii Not Indicated  Final   M gordonae Not Indicated  Final   Other: NAIDN  Final    Comment: No additional identification testing is necessary.   Susceptibility Testing See reflex.  Final    Comment: (NOTE) Performed At: [facility] [place], [place] [id number] Rush Farmer MD YI:[id number] Performed at [facility], [address], [place], [place] [address]   MAC Susceptibility Broth     Status: None (Preliminary result)   Collection Time: 09/19/17  6:00 AM  Result Value Ref Range Status   Organism ID PENDING  Incomplete   Amikacin PENDING  Incomplete   Ciprofloxacin PENDING  Incomplete   Clarithromycin PENDING  Incomplete   Ethambutol PENDING  Incomplete   Linezolid PENDING  Incomplete   Moxifloxacin PENDING  Incomplete   Rifampin PENDING  Incomplete   Streptomycin PENDING  Incomplete   Please note: Comment  Final    Comment: (NOTE) CLSI does not have established guidelines for interpretation of these organism-drug combinations. Results for this test are for research purposes only by  the assay's manufacturer.  The performance characteristics of this product have not been established.  Results should not be used as a diagnostic procedure without confirmation of the diagnosis by another medically established diagnostic product or procedure. Performed At: [facility] [place], [place] [id number] Rush Farmer MD [id number] Performed at [facility], [address] [place], [place] [address]   MRSA PCR Screening     Status: None   Collection  Time: 10/03/17  2:43 AM  Result Value Ref Range Status   MRSA by PCR NEGATIVE NEGATIVE Final    Comment:        The GeneXpert MRSA Assay (FDA approved for NASAL specimens only), is one component of a comprehensive MRSA colonization surveillance program. It is not intended to diagnose MRSA infection nor to guide or monitor treatment for MRSA infections. Performed at [facility], [address] [place], [place] [address]   Acid Fast Smear (AFB)     Status: None   Collection Time: 10/03/17  2:00 PM  Result Value Ref Range Status   AFB Specimen Processing Concentration  Final   Acid Fast Smear Negative  Final    Comment: (NOTE) Performed At: [facility] [address] [place], [place] [id number] Rush Farmer MD FG:[id number]    Source (AFB) EXPECTORATED SPUTUM  Final    Comment: Performed at [facility], [address] [place], [place] [address]  Culture, blood (Routine x 2)     Status: None (Preliminary result)   Collection Time: 10/17/17  9:20 PM  Result Value Ref Range Status   Specimen Description RIGHT ANTECUBITAL  Final   Special Requests   Final    BOTTLES DRAWN AEROBIC AND ANAEROBIC Blood Culture adequate volume Performed at [facility], [address] [place], [place] [address]    Culture PENDING  Incomplete   Report Status PENDING  Incomplete    Medical History: Past Medical History:  Diagnosis Date  . Atrial fibrillation ([place])    Not anticoagulated  . Barrett's esophagus   . Borderline diabetes   . Bronchitis   . CAP (community acquired pneumonia) 07/10/2013   04/2015  . 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 Chronic back pain   . Chronic left shoulder pain   . Chronic neck pain   . Chronic respiratory failure ([place])   . Collagen vascular disease ([place])   . COPD (chronic obstructive pulmonary disease) ([place])   . Coronary atherosclerosis of native coronary artery    Mild nonobstructive disease at  catheterization 2007  . DDD (degenerative disc disease)    Cervical and thoracic  . GERD (gastroesophageal reflux disease)   . History of pneumonia   . Hypercholesteremia   . Polycythemia   . Seizures ([place])    Last seizure 2 yrs ago  . Smoker   . Type 2 diabetes mellitus ([place])   . Type 2 diabetes mellitus without complication (HCC)     Medications:   Assessment: 57 yo male with PMH of COPD with recent hospitalization for influenza and pneumonia. Pt has been on OP oral levofloxacin x several days with last dose given in the am on 4/6, but reports increased SOB for 2 days. Pharmacy has been consulted for levofloxacin dosing for bronchitis.  Goal of Therapy:  Eradicate infection  Plan:  Preliminary review of pertinent patient information completed.  Protocol will be initiated with dose of levofloxacin 500 mg IV.[name]  Forestine Na clinical pharmacist will complete  review during morning rounds to assess patient and finalize treatment regimen if needed.  Norberto Sorenson, [facility] 10/18/2017,1:07 AM

## 2017-10-18 NOTE — Progress Notes (Signed)
Subjective: He has come back in with another episode of acute respiratory failure.  He says he feels better this morning.  He had significant swelling of his legs right more than left also.  His last echocardiogram was in 2017.  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  I do not think it is reasonable right now to start him on medicines because I think he is very likely resistant to ethambutol and rifampin.  I should have sensitivities I think this week.  Objective: Vital signs in last 24 hours: Temp:  [97.9 F (36.6 C)-98.4 F (36.9 C)] 97.9 F (36.6 C) (04/07 0500) Pulse Rate:  [95-120] 95 (04/07 0500) Resp:  [17-27] 20 (04/07 0500) BP: (93-135)/(71-87) 110/71 (04/07 0500) SpO2:  [90 %-100 %] 100 % (04/07 0726) Weight:  [65.9 kg (145 lb 4.5 oz)-67.9 kg (149 lb 11.1 oz)] 67.9 kg (149 lb 11.1 oz) (04/07 0500) Weight change:  Last BM Date: 10/17/17  Intake/Output from previous day: 04/06 0701 - 04/07 0700 In: 60 [P.O.:60] Out: -   PHYSICAL EXAM General appearance: alert, cooperative and no distress Resp: rhonchi bilaterally Cardio: regular rate and rhythm, S1, S2 normal, no murmur, click, rub or gallop GI: soft, non-tender; bowel sounds normal; no masses,  no organomegaly Extremities: extremities normal, atraumatic, no cyanosis or edema Correction of the extremity exam he has 2+ edema of his right leg and 1+ of his left  Lab Results:  Results for orders placed or performed during the hospital encounter of 10/17/17 (from the past 48 hour(s))  Comprehensive metabolic panel     Status: Abnormal   Collection Time: 10/17/17  8:37 PM  Result Value Ref Range   Sodium 135 135 - 145 mmol/L   Potassium 3.7 3.5 - 5.1 mmol/L   Chloride 94 (L) 101 - 111 mmol/L   CO2 27 22 - 32 mmol/L   Glucose, Bld 151 (H) 65 - 99 mg/dL   BUN 19 6 - 20 mg/dL   Creatinine, Ser 1.07 0.61 - 1.24 mg/dL   Calcium 8.5 (L) 8.9 - 10.3 mg/dL    Total Protein 6.6 6.5 - 8.1 g/dL   Albumin 2.7 (L) 3.5 - 5.0 g/dL   AST 34 15 - 41 U/L   ALT 42 17 - 63 U/L   Alkaline Phosphatase 91 38 - 126 U/L   Total Bilirubin 0.7 0.3 - 1.2 mg/dL   GFR calc non Af Amer >60 >60 mL/min   GFR calc Af Amer >60 >60 mL/min    Comment: (NOTE) The eGFR has been calculated using the CKD EPI equation. This calculation has not been validated in all clinical situations. eGFR's persistently <60 mL/min signify possible Chronic Kidney Disease.    Anion gap 14 5 - 15    Comment: Performed at [facility], [address], [place], [place] [address]  I-Stat CG4 Lactic Acid, ED     Status: None   Collection Time: 10/17/17  8:37 PM  Result Value Ref Range   Lactic Acid, Venous 1.21 0.5 - 1.9 mmol/L  CBC with Differential     Status: Abnormal   Collection Time: 10/17/17  8:37 PM  Result Value Ref Range   WBC 9.9 4.0 - 10.5 K/uL   RBC 4.01 (L) 4.22 - 5.81 MIL/uL   Hemoglobin 10.7 (L) 13.0 - 17.0 g/dL   HCT 33.7 (L) 39.0 - 52.0 %   MCV 84.0 78.0 - 100.0 fL  MCH 26.7 26.0 - 34.0 pg   MCHC 31.8 30.0 - 36.0 g/dL   RDW 14.2 11.5 - 15.5 %   Platelets 235 150 - 400 K/uL   Neutrophils Relative % 72 %   Neutro Abs 7.1 1.7 - 7.7 K/uL   Lymphocytes Relative 15 %   Lymphs Abs 1.5 0.7 - 4.0 K/uL   Monocytes Relative 11 %   Monocytes Absolute 1.1 (H) 0.1 - 1.0 K/uL   Eosinophils Relative 2 %   Eosinophils Absolute 0.2 0.0 - 0.7 K/uL   Basophils Relative 0 %   Basophils Absolute 0.0 0.0 - 0.1 K/uL    Comment: Performed at [facility], [address], [place], [place] [address]  Protime-INR     Status: None   Collection Time: 10/17/17  8:37 PM  Result Value Ref Range   Prothrombin Time 14.8 11.4 - 15.2 seconds   INR 1.17     Comment: Performed at [facility], [address] [place], [place] [address]  Culture, blood (Routine x 2)     Status: None (Preliminary result)   Collection Time: 10/17/17  8:37 PM  Result Value Ref Range   Specimen Description BLOOD  RIGHT WRIST    Special Requests      BOTTLES DRAWN AEROBIC AND ANAEROBIC Blood Culture adequate volume   Culture      NO GROWTH < 12 HOURS Performed at [facility], [address], [place], [place] [address]    Report Status PENDING   Brain natriuretic peptide     Status: None   Collection Time: 10/17/17  8:37 PM  Result Value Ref Range   B Natriuretic Peptide 34.0 0.0 - 100.0 pg/mL    Comment: Performed at [facility], [address] [place], [place] [address]  Troponin I     Status: None   Collection Time: 10/17/17  8:37 PM  Result Value Ref Range   Troponin I <0.03 <0.03 ng/mL    Comment: Performed at [facility], [address], [place], [place] [address]  Culture, blood (Routine x 2)     Status: None (Preliminary result)   Collection Time: 10/17/17  9:20 PM  Result Value Ref Range   Specimen Description RIGHT ANTECUBITAL    Special Requests      BOTTLES DRAWN AEROBIC AND ANAEROBIC Blood Culture adequate volume   Culture      NO GROWTH < 12 HOURS Performed at [facility], [address] [place], [place] [address]    Report Status PENDING   Urinalysis, Routine w reflex microscopic     Status: Abnormal   Collection Time: 10/17/17 10:05 PM  Result Value Ref Range   Color, Urine YELLOW YELLOW   APPearance CLEAR CLEAR   Specific Gravity, Urine 1.010 1.005 - 1.030   pH 5.0 5.0 - 8.0   Glucose, UA NEGATIVE NEGATIVE mg/dL   Hgb urine dipstick SMALL (A) NEGATIVE   Bilirubin Urine NEGATIVE NEGATIVE   Ketones, ur NEGATIVE NEGATIVE mg/dL   Protein, ur NEGATIVE NEGATIVE mg/dL   Nitrite NEGATIVE NEGATIVE   Leukocytes, UA NEGATIVE NEGATIVE   RBC / HPF 0-5 0 - 5 RBC/hpf   WBC, UA 0-5 0 - 5 WBC/hpf   Bacteria, UA NONE SEEN NONE SEEN   Squamous Epithelial / LPF NONE SEEN NONE SEEN    Comment: Performed at [facility], [address], [place], [place] [address]  I-Stat CG4 Lactic Acid, ED     Status: None   Collection Time: 10/17/17 10:16 PM  Result Value Ref Range   Lactic  Acid, Venous 1.48 0.5 - 1.9 mmol/L  Basic metabolic panel     Status: Abnormal   Collection Time: 10/18/17  6:12 AM  Result Value Ref Range   Sodium 134 (L) 135 - 145 mmol/L   Potassium 4.0 3.5 - 5.1 mmol/L   Chloride 91 (L) 101 - 111 mmol/L   CO2 28 22 - 32 mmol/L   Glucose, Bld 308 (H) 65 - 99 mg/dL   BUN 21 (H) 6 - 20 mg/dL   Creatinine, Ser 1.15 0.61 - 1.24 mg/dL   Calcium 8.5 (L) 8.9 - 10.3 mg/dL   GFR calc non Af Amer >60 >60 mL/min   GFR calc Af Amer >60 >60 mL/min    Comment: (NOTE) The eGFR has been calculated using the CKD EPI equation. This calculation has not been validated in all clinical situations. eGFR's persistently <60 mL/min signify possible Chronic Kidney Disease.    Anion gap 15 5 - 15    Comment: Performed at [facility], [address], [place], [place] [address]  CBC     Status: Abnormal   Collection Time: 10/18/17  6:12 AM  Result Value Ref Range   WBC 9.0 4.0 - 10.5 K/uL   RBC 3.88 (L) 4.22 - 5.81 MIL/uL   Hemoglobin 10.3 (L) 13.0 - 17.0 g/dL   HCT 32.9 (L) 39.0 - 52.0 %   MCV 84.8 78.0 - 100.0 fL   MCH 26.5 26.0 - 34.0 pg   MCHC 31.3 30.0 - 36.0 g/dL   RDW 14.3 11.5 - 15.5 %   Platelets 246 150 - 400 K/uL    Comment: Performed at [facility], [address] [place], [place] [address]  Glucose, capillary     Status: Abnormal   Collection Time: 10/18/17  7:26 AM  Result Value Ref Range   Glucose-Capillary 275 (H) 65 - 99 mg/dL    ABGS No results for input(s): PHART, PO2ART, TCO2, HCO3 in the last 72 hours.  Invalid input(s): PCO2 CULTURES Recent Results (from the past 240 hour(s))  Culture, blood (Routine x 2)     Status: None (Preliminary result)   Collection Time: 10/17/17  8:37 PM  Result Value Ref Range Status   Specimen Description BLOOD RIGHT WRIST  Final   Special Requests   Final    BOTTLES DRAWN AEROBIC AND ANAEROBIC Blood Culture adequate volume   Culture   Final    NO GROWTH < 12 HOURS Performed at [facility], [address] [place], [place] [address]    Report Status PENDING  Incomplete  Culture, blood (Routine x 2)     Status: None (Preliminary result)   Collection Time: 10/17/17  9:20 PM  Result Value Ref Range Status   Specimen Description RIGHT ANTECUBITAL  Final   Special Requests   Final    BOTTLES DRAWN AEROBIC AND ANAEROBIC Blood Culture adequate volume   Culture   Final    NO GROWTH < 12 HOURS Performed at [facility], [address], [place], [place] [address]    Report Status PENDING  Incomplete   Studies/Results: Dg Chest 2 View  Result Date: 10/17/2017 CLINICAL DATA:  Deep cough with chest pain EXAM: CHEST - 2 VIEW COMPARISON:  09/15/2017.  CT chest 09/01/2017. FINDINGS: Lungs are hyperexpanded. Chronic interstitial changes noted right lung base with bullous change in the right apex. Cavitary lesions again noted in the left mid and posterior lower lung. The cardiopericardial silhouette is  within normal limits for size. The visualized bony structures of the thorax are intact. Telemetry leads overlie the chest. IMPRESSION: Marked hyperexpansion with cavitary lesions in the left mid and posterior lower lung, better seen on previous CT. Electronically Signed   By: Misty Stanley M.D.   On: 10/17/2017 21:22    Medications:  Prior to Admission:  Medications Prior to Admission  Medication Sig Dispense Refill Last Dose  . albuterol (VENTOLIN HFA) 108 (90 BASE) MCG/ACT inhaler Inhale 2 puffs into the lungs every 6 (six) hours as needed for wheezing or shortness of breath.   10/17/2017 at Unknown time  . azithromycin (ZITHROMAX Z-PAK) 250 MG tablet Take by package instructions 6 each 0 Past Month at Unknown time  . baclofen (LIORESAL) 20 MG tablet Take 20 mg by mouth 3 (three) times daily.    10/16/2017 at Unknown time  . benzonatate (TESSALON) 200 MG capsule Take 200 mg by mouth 3 (three) times daily.   10/16/2017 at Unknown time  . CARTIA XT 120 MG 24 hr capsule Take 120 mg by mouth daily.    10/17/2017 at  Unknown time  . Fluticasone-Umeclidin-Vilant (TRELEGY ELLIPTA) 100-62.5-25 MCG/INH AEPB Inhale 1 puff into the lungs daily. Rinse mouth after you use this 1 each 12 10/17/2017 at Unknown time  . furosemide (LASIX) 40 MG tablet Take 40 mg by mouth daily.   10/17/2017 at Unknown time  . guaifenesin (HUMIBID E) 400 MG TABS tablet Take 400 mg by mouth every 4 (four) hours.     [name] HUMALOG KWIKPEN 100 UNIT/ML KiwkPen   0 10/17/2017 at Unknown time  . insulin glargine (LANTUS) 100 UNIT/ML injection Inject 0.16 mLs (16 Units total) into the skin daily. 10 mL 11 10/16/2017 at Unknown time  . insulin lispro (HUMALOG) 100 UNIT/ML injection Take by sliding scale provided by hospital maximum amount daily is 60 units.  Check blood sugar 4 times a day 10 mL 11 10/17/2017 at Unknown time  . ipratropium (ATROVENT) 0.02 % nebulizer solution VVN TID WITH LEVALBUTEROL  5 10/17/2017 at Unknown time  . levalbuterol (XOPENEX) 0.63 MG/3ML nebulizer solution Take 3 mLs (0.63 mg total) by nebulization every 8 (eight) hours as needed for wheezing or shortness of breath. 3 mL 12 10/17/2017 at Unknown time  . levofloxacin (LEVAQUIN) 500 MG tablet Take 1 tablet by mouth every other day.  0 10/17/2017 at Unknown time  . LORazepam (ATIVAN) 0.5 MG tablet Take 1 tablet (0.5 mg total) by mouth 2 (two) times daily as needed for anxiety or sleep. (Patient taking differently: Take 0.5 mg by mouth 4 (four) times daily as needed for anxiety or sleep. ) 20 tablet 0 10/16/2017 at Unknown time  . metFORMIN (GLUCOPHAGE) 500 MG tablet Take 1 tablet (500 mg total) by mouth daily with breakfast. 30 tablet 3 10/17/2017 at Unknown time  . montelukast (SINGULAIR) 10 MG tablet Take 10 mg by mouth at bedtime.   10/16/2017 at Unknown time  . omeprazole (PRILOSEC) 20 MG capsule Take 20 mg by mouth daily.   10/17/2017 at Unknown time  . oxyCODONE-acetaminophen (PERCOCET) 10-325 MG tablet Take 1 tablet by mouth every 4 (four) hours as needed for pain.   10/17/2017 at Unknown time  .  nitroGLYCERIN (NITROSTAT) 0.4 MG SL tablet Place 1 tablet (0.4 mg total) under the tongue every 5 (five) minutes as needed for chest pain. 25 tablet 4 unknown  . predniSONE (STERAPRED UNI-PAK 21 TAB) 10 MG (21) TBPK tablet Take by package instructions (Patient  not taking: Reported on 10/17/2017) 21 tablet 0 Completed Course at Unknown time   Scheduled: . baclofen  20 mg Oral TID  . benzonatate  200 mg Oral TID  . budesonide (PULMICORT) nebulizer solution  0.25 mg Nebulization BID  . diltiazem  120 mg Oral Daily  . furosemide  40 mg Oral Daily  . guaiFENesin  400 mg Oral Q4H  . heparin  5,000 Units Subcutaneous Q8H  . insulin aspart  0-9 Units Subcutaneous TID WC  . insulin glargine  20 Units Subcutaneous Daily  . ipratropium  0.5 mg Nebulization Q6H  . levalbuterol  0.63 mg Nebulization Q6H  . methylPREDNISolone (SOLU-MEDROL) injection  40 mg Intravenous Daily  . montelukast  10 mg Oral QHS  . pantoprazole  40 mg Oral Daily   Continuous:  HFS:[id number] **OR** acetaminophen, levalbuterol, LORazepam, nitroGLYCERIN, ondansetron **OR** ondansetron (ZOFRAN) IV, oxyCODONE-acetaminophen  Assesment: He has acute hypoxic respiratory failure that may be a combination of pulmonary fibrosis COPD exacerbation and perhaps some heart failure.  Additionally he is known to have Mycobacterium avium complex. Principal Problem:   Acute respiratory failure with hypoxia (HCC) Active Problems:   Type 2 diabetes mellitus without complication (HCC)   Paroxysmal atrial fibrillation (HCC)   Pulmonary fibrosis (HCC)   Anemia of chronic disease   Lower extremity edema   COPD with acute exacerbation (HCC)    Plan: Check ultrasound of his leg.  Continue Lasix.  Continue other treatments.  Await sensitivities and discuss with infectious disease    LOS: 1 day   Tripton Ned L 10/18/2017, 7:53 AM

## 2017-10-19 LAB — GLUCOSE, CAPILLARY
GLUCOSE-CAPILLARY: 321 mg/dL — AB (ref 65–99)
GLUCOSE-CAPILLARY: 413 mg/dL — AB (ref 65–99)
Glucose-Capillary: 242 mg/dL — ABNORMAL HIGH (ref 65–99)
Glucose-Capillary: 260 mg/dL — ABNORMAL HIGH (ref 65–99)
Glucose-Capillary: 296 mg/dL — ABNORMAL HIGH (ref 65–99)

## 2017-10-19 MED ORDER — HYDROMORPHONE HCL 1 MG/ML IJ SOLN
0.5000 mg | INTRAMUSCULAR | Status: DC | PRN
  Administered 2017-10-19 – 2017-10-24 (×24): 0.5 mg via INTRAVENOUS
  Filled 2017-10-19 (×26): qty 0.5

## 2017-10-19 MED ORDER — INSULIN ASPART 100 UNIT/ML ~~LOC~~ SOLN
5.0000 [IU] | Freq: Once | SUBCUTANEOUS | Status: AC
  Administered 2017-10-19: 5 [IU] via SUBCUTANEOUS

## 2017-10-19 MED ORDER — LORAZEPAM 0.5 MG PO TABS
0.5000 mg | ORAL_TABLET | Freq: Two times a day (BID) | ORAL | Status: DC
  Administered 2017-10-19 – 2017-10-24 (×11): 0.5 mg via ORAL
  Filled 2017-10-19 (×11): qty 1

## 2017-10-19 MED ORDER — FUROSEMIDE 10 MG/ML IJ SOLN
40.0000 mg | Freq: Every day | INTRAMUSCULAR | Status: DC
  Administered 2017-10-19 – 2017-10-21 (×3): 40 mg via INTRAVENOUS
  Filled 2017-10-19 (×3): qty 4

## 2017-10-19 NOTE — Progress Notes (Signed)
Subjective: He says he feels fairly well.  He is still having a lot of swelling of both legs.  He has more pain.  He wants to have his anxiety medication scheduled because he takes it that way at home.  He understands the plan to get sensitivities for his mycobacterial disease and then start him on medication  Objective: Vital signs in last 24 hours: Temp:  [97.7 F (36.5 C)-98.2 F (36.8 C)] 97.7 F (36.5 C) (04/08 0539) Pulse Rate:  [78-150] 89 (04/08 0539) Resp:  [18-20] 18 (04/08 0539) BP: (98-124)/(63-77) 98/73 (04/08 0539) SpO2:  [91 %-96 %] 91 % (04/08 0539) Weight change:  Last BM Date: 10/15/17  Intake/Output from previous day: 04/07 0701 - 04/08 0700 In: 820 [P.O.:720; IV Piggyback:100] Out: 1925 [Urine:1925]  PHYSICAL EXAM General appearance: alert, cooperative and no distress Resp: rhonchi bilaterally Cardio: regular rate and rhythm, S1, S2 normal, no murmur, click, rub or gallop GI: soft, non-tender; bowel sounds normal; no masses,  no organomegaly Extremities: 2+ edema of the legs bilaterally Skin warm and dry  Lab Results:  Results for orders placed or performed during the hospital encounter of 10/17/17 (from the past 48 hour(s))  Comprehensive metabolic panel     Status: Abnormal   Collection Time: 10/17/17  8:37 PM  Result Value Ref Range   Sodium 135 135 - 145 mmol/L   Potassium 3.7 3.5 - 5.1 mmol/L   Chloride 94 (L) 101 - 111 mmol/L   CO2 27 22 - 32 mmol/L   Glucose, Bld 151 (H) 65 - 99 mg/dL   BUN 19 6 - 20 mg/dL   Creatinine, Ser 1.07 0.61 - 1.24 mg/dL   Calcium 8.5 (L) 8.9 - 10.3 mg/dL   Total Protein 6.6 6.5 - 8.1 g/dL   Albumin 2.7 (L) 3.5 - 5.0 g/dL   AST 34 15 - 41 U/L   ALT 42 17 - 63 U/L   Alkaline Phosphatase 91 38 - 126 U/L   Total Bilirubin 0.7 0.3 - 1.2 mg/dL   GFR calc non Af Amer >60 >60 mL/min   GFR calc Af Amer >60 >60 mL/min    Comment: (NOTE) The eGFR has been calculated using the CKD EPI equation. This calculation has not  been validated in all clinical situations. eGFR's persistently <60 mL/min signify possible Chronic Kidney Disease.    Anion gap 14 5 - 15    Comment: Performed at [facility], [address] [place], [place] [address]  I-Stat CG4 Lactic Acid, ED     Status: None   Collection Time: 10/17/17  8:37 PM  Result Value Ref Range   Lactic Acid, Venous 1.21 0.5 - 1.9 mmol/L  CBC with Differential     Status: Abnormal   Collection Time: 10/17/17  8:37 PM  Result Value Ref Range   WBC 9.9 4.0 - 10.5 K/uL   RBC 4.01 (L) 4.22 - 5.81 MIL/uL   Hemoglobin 10.7 (L) 13.0 - 17.0 g/dL   HCT 33.7 (L) 39.0 - 52.0 %   MCV 84.0 78.0 - 100.0 fL   MCH 26.7 26.0 - 34.0 pg   MCHC 31.8 30.0 - 36.0 g/dL   RDW 14.2 11.5 - 15.5 %   Platelets 235 150 - 400 K/uL   Neutrophils Relative % 72 %   Neutro Abs 7.1 1.7 - 7.7 K/uL   Lymphocytes Relative 15 %   Lymphs Abs 1.5 0.7 - 4.0 K/uL   Monocytes Relative 11 %   Monocytes Absolute  1.1 (H) 0.1 - 1.0 K/uL   Eosinophils Relative 2 %   Eosinophils Absolute 0.2 0.0 - 0.7 K/uL   Basophils Relative 0 %   Basophils Absolute 0.0 0.0 - 0.1 K/uL    Comment: Performed at [facility], [address], [place], [place] [address]  Protime-INR     Status: None   Collection Time: 10/17/17  8:37 PM  Result Value Ref Range   Prothrombin Time 14.8 11.4 - 15.2 seconds   INR 1.17     Comment: Performed at [facility], [address], [place], [place] [address]  Culture, blood (Routine x 2)     Status: None (Preliminary result)   Collection Time: 10/17/17  8:37 PM  Result Value Ref Range   Specimen Description BLOOD RIGHT WRIST    Special Requests      BOTTLES DRAWN AEROBIC AND ANAEROBIC Blood Culture adequate volume   Culture      NO GROWTH 2 DAYS Performed at [facility], [address], [place], [place] [address]    Report Status PENDING   Brain natriuretic peptide     Status: None   Collection Time: 10/17/17  8:37 PM  Result Value Ref Range   B Natriuretic Peptide 34.0  0.0 - 100.0 pg/mL    Comment: Performed at [facility], [address] [place], [place] [address]  Troponin I     Status: None   Collection Time: 10/17/17  8:37 PM  Result Value Ref Range   Troponin I <0.03 <0.03 ng/mL    Comment: Performed at [facility], [address] [place], [place] [address]  Culture, blood (Routine x 2)     Status: None (Preliminary result)   Collection Time: 10/17/17  9:20 PM  Result Value Ref Range   Specimen Description RIGHT ANTECUBITAL    Special Requests      BOTTLES DRAWN AEROBIC AND ANAEROBIC Blood Culture adequate volume   Culture      NO GROWTH 2 DAYS Performed at [facility], [address] [place], [place] [address]    Report Status PENDING   Urinalysis, Routine w reflex microscopic     Status: Abnormal   Collection Time: 10/17/17 10:05 PM  Result Value Ref Range   Color, Urine YELLOW YELLOW   APPearance CLEAR CLEAR   Specific Gravity, Urine 1.010 1.005 - 1.030   pH 5.0 5.0 - 8.0   Glucose, UA NEGATIVE NEGATIVE mg/dL   Hgb urine dipstick SMALL (A) NEGATIVE   Bilirubin Urine NEGATIVE NEGATIVE   Ketones, ur NEGATIVE NEGATIVE mg/dL   Protein, ur NEGATIVE NEGATIVE mg/dL   Nitrite NEGATIVE NEGATIVE   Leukocytes, UA NEGATIVE NEGATIVE   RBC / HPF 0-5 0 - 5 RBC/hpf   WBC, UA 0-5 0 - 5 WBC/hpf   Bacteria, UA NONE SEEN NONE SEEN   Squamous Epithelial / LPF NONE SEEN NONE SEEN    Comment: Performed at [facility], [address], [place], [place] [address]  I-Stat CG4 Lactic Acid, ED     Status: None   Collection Time: 10/17/17 10:16 PM  Result Value Ref Range   Lactic Acid, Venous 1.48 0.5 - 1.9 mmol/L  Basic metabolic panel     Status: Abnormal   Collection Time: 10/18/17  6:12 AM  Result Value Ref Range   Sodium 134 (L) 135 - 145 mmol/L   Potassium 4.0 3.5 - 5.1 mmol/L   Chloride 91 (L) 101 - 111 mmol/L   CO2 28 22 - 32 mmol/L  Glucose, Bld 308 (H) 65 - 99 mg/dL   BUN 21 (H) 6 - 20 mg/dL   Creatinine, Ser 1.15 0.61 - 1.24 mg/dL    Calcium 8.5 (L) 8.9 - 10.3 mg/dL   GFR calc non Af Amer >60 >60 mL/min   GFR calc Af Amer >60 >60 mL/min    Comment: (NOTE) The eGFR has been calculated using the CKD EPI equation. This calculation has not been validated in all clinical situations. eGFR's persistently <60 mL/min signify possible Chronic Kidney Disease.    Anion gap 15 5 - 15    Comment: Performed at [facility], [address] [place], [place] [address]  CBC     Status: Abnormal   Collection Time: 10/18/17  6:12 AM  Result Value Ref Range   WBC 9.0 4.0 - 10.5 K/uL   RBC 3.88 (L) 4.22 - 5.81 MIL/uL   Hemoglobin 10.3 (L) 13.0 - 17.0 g/dL   HCT 32.9 (L) 39.0 - 52.0 %   MCV 84.8 78.0 - 100.0 fL   MCH 26.5 26.0 - 34.0 pg   MCHC 31.3 30.0 - 36.0 g/dL   RDW 14.3 11.5 - 15.5 %   Platelets 246 150 - 400 K/uL    Comment: Performed at [facility], [address] [place], [place] [address]  D-dimer, quantitative (not at [facility])     Status: Abnormal   Collection Time: 10/18/17  6:12 AM  Result Value Ref Range   D-Dimer, Quant 1.78 (H) 0.00 - 0.50 ug/mL-FEU    Comment: (NOTE) At the manufacturer cut-off of 0.50 ug/mL FEU, this assay has been documented to exclude PE with a sensitivity and negative predictive value of 97 to 99%.  At this time, this assay has not been approved by the FDA to exclude DVT/VTE. Results should be correlated with clinical presentation. Performed at [facility], [address], [place], [place] [address]   Glucose, capillary     Status: Abnormal   Collection Time: 10/18/17  7:26 AM  Result Value Ref Range   Glucose-Capillary 275 (H) 65 - 99 mg/dL  Culture, expectorated sputum-assessment     Status: None   Collection Time: 10/18/17 10:42 AM  Result Value Ref Range   Specimen Description SPUTUM    Special Requests NONE    Sputum evaluation      THIS SPECIMEN IS ACCEPTABLE FOR SPUTUM CULTURE Performed at [facility], [address] [place], [place] [address]    Report Status 10/18/2017 FINAL    Culture, respiratory (NON-Expectorated)     Status: None (Preliminary result)   Collection Time: 10/18/17 10:42 AM  Result Value Ref Range   Specimen Description      SPUTUM Performed at [facility], [address], [place], [place] [address]    Special Requests      NONE Reflexed from [id number] Performed at [facility], [address], [place], [place] [address]    Gram Stain      MODERATE WBC PRESENT, PREDOMINANTLY PMN FEW GRAM POSITIVE COCCI IN CLUSTERS RARE YEAST RARE GRAM NEGATIVE RODS Performed at [facility], [place] [address], [place], [place] [address]    Culture PENDING    Report Status PENDING   Glucose, capillary     Status: Abnormal   Collection Time: 10/18/17 11:25 AM  Result Value Ref Range   Glucose-Capillary 362 (H) 65 - 99 mg/dL  Glucose, capillary     Status: Abnormal   Collection Time: 10/18/17  4:31 PM  Result Value Ref  Range   Glucose-Capillary 254 (H) 65 - 99 mg/dL  Glucose, capillary     Status: Abnormal   Collection Time: 10/18/17  9:17 PM  Result Value Ref Range   Glucose-Capillary 275 (H) 65 - 99 mg/dL  Glucose, capillary     Status: Abnormal   Collection Time: 10/19/17  8:05 AM  Result Value Ref Range   Glucose-Capillary 242 (H) 65 - 99 mg/dL    ABGS No results for input(s): PHART, PO2ART, TCO2, HCO3 in the last 72 hours.  Invalid input(s): PCO2 CULTURES Recent Results (from the past 240 hour(s))  Culture, blood (Routine x 2)     Status: None (Preliminary result)   Collection Time: 10/17/17  8:37 PM  Result Value Ref Range Status   Specimen Description BLOOD RIGHT WRIST  Final   Special Requests   Final    BOTTLES DRAWN AEROBIC AND ANAEROBIC Blood Culture adequate volume   Culture   Final    NO GROWTH 2 DAYS Performed at [facility], [address], [place], [place] [address]    Report Status PENDING  Incomplete  Culture, blood (Routine x 2)     Status: None (Preliminary result)   Collection Time: 10/17/17  9:20 PM  Result Value  Ref Range Status   Specimen Description RIGHT ANTECUBITAL  Final   Special Requests   Final    BOTTLES DRAWN AEROBIC AND ANAEROBIC Blood Culture adequate volume   Culture   Final    NO GROWTH 2 DAYS Performed at [facility], [address], [place], [place] [address]    Report Status PENDING  Incomplete  Culture, expectorated sputum-assessment     Status: None   Collection Time: 10/18/17 10:42 AM  Result Value Ref Range Status   Specimen Description SPUTUM  Final   Special Requests NONE  Final   Sputum evaluation   Final    THIS SPECIMEN IS ACCEPTABLE FOR SPUTUM CULTURE Performed at [facility], [address], [place], [place] [address]    Report Status 10/18/2017 FINAL  Final  Culture, respiratory (NON-Expectorated)     Status: None (Preliminary result)   Collection Time: 10/18/17 10:42 AM  Result Value Ref Range Status   Specimen Description   Final    SPUTUM Performed at [facility], [address] [place], [place] [address]    Special Requests   Final    NONE Reflexed from [id number] Performed at [facility], [address], [place], [place] [address]    Gram Stain   Final    MODERATE WBC PRESENT, PREDOMINANTLY PMN FEW GRAM POSITIVE COCCI IN CLUSTERS RARE YEAST RARE GRAM NEGATIVE RODS Performed at [facility], [place] [address], [place], [place] [address]    Culture PENDING  Incomplete   Report Status PENDING  Incomplete   Studies/Results: Dg Chest 2 View  Result Date: 10/17/2017 CLINICAL DATA:  Deep cough with chest pain EXAM: CHEST - 2 VIEW COMPARISON:  09/15/2017.  CT chest 09/01/2017. FINDINGS: Lungs are hyperexpanded. Chronic interstitial changes noted right lung base with bullous change in the right apex. Cavitary lesions again noted in the left mid and posterior lower lung. The cardiopericardial silhouette is within normal limits for size. The visualized bony structures of the thorax are intact. Telemetry leads overlie the chest. IMPRESSION: Marked  hyperexpansion with cavitary lesions in the left mid and posterior lower lung, better seen on previous CT. Electronically Signed   By: Misty Stanley M.D.   On: 10/17/2017 21:22   US  Venous Img Lower Bilateral  Result Date: 10/18/2017 CLINICAL DATA:  57 year old male with bilateral lower extremity edema for the past 3 days EXAM: BILATERAL LOWER EXTREMITY VENOUS DOPPLER ULTRASOUND TECHNIQUE: Gray-scale sonography with graded compression, as well as color Doppler and duplex ultrasound were performed to evaluate the lower extremity deep venous systems from the level of the common femoral vein and including the common femoral, femoral, profunda femoral, popliteal and calf veins including the posterior tibial, peroneal and gastrocnemius veins when visible. The superficial great saphenous vein was also interrogated. Spectral Doppler was utilized to evaluate flow at rest and with distal augmentation maneuvers in the common femoral, femoral and popliteal veins. COMPARISON:  None. FINDINGS: RIGHT LOWER EXTREMITY Common Femoral Vein: No evidence of thrombus. Normal compressibility, respiratory phasicity and response to augmentation. Saphenofemoral Junction: No evidence of thrombus. Normal compressibility and flow on color Doppler imaging. Profunda Femoral Vein: No evidence of thrombus. Normal compressibility and flow on color Doppler imaging. Femoral Vein: No evidence of thrombus. Normal compressibility, respiratory phasicity and response to augmentation. Popliteal Vein: No evidence of thrombus. Normal compressibility, respiratory phasicity and response to augmentation. Calf Veins: No evidence of thrombus. Normal compressibility and flow on color Doppler imaging. Superficial Great Saphenous Vein: No evidence of thrombus. Normal compressibility. Venous Reflux:  None. Other Findings:  None. LEFT LOWER EXTREMITY Common Femoral Vein: No evidence of thrombus. Normal compressibility, respiratory phasicity and response to  augmentation. Saphenofemoral Junction: No evidence of thrombus. Normal compressibility and flow on color Doppler imaging. Profunda Femoral Vein: No evidence of thrombus. Normal compressibility and flow on color Doppler imaging. Femoral Vein: No evidence of thrombus. Normal compressibility, respiratory phasicity and response to augmentation. Popliteal Vein: No evidence of thrombus. Normal compressibility, respiratory phasicity and response to augmentation. Calf Veins: No evidence of thrombus. Normal compressibility and flow on color Doppler imaging. Superficial Great Saphenous Vein: No evidence of thrombus. Normal compressibility. Venous Reflux:  None. Other Findings:  None. IMPRESSION: No evidence of deep venous thrombosis. Electronically Signed   By: Jacqulynn Cadet M.D.   On: 10/18/2017 11:18    Medications:  Prior to Admission:  Medications Prior to Admission  Medication Sig Dispense Refill Last Dose  . albuterol (VENTOLIN HFA) 108 (90 BASE) MCG/ACT inhaler Inhale 2 puffs into the lungs every 6 (six) hours as needed for wheezing or shortness of breath.   10/17/2017 at Unknown time  . azithromycin (ZITHROMAX Z-PAK) 250 MG tablet Take by package instructions 6 each 0 Past Month at Unknown time  . baclofen (LIORESAL) 20 MG tablet Take 20 mg by mouth 3 (three) times daily.    10/16/2017 at Unknown time  . benzonatate (TESSALON) 200 MG capsule Take 200 mg by mouth 3 (three) times daily.   10/16/2017 at Unknown time  . CARTIA XT 120 MG 24 hr capsule Take 120 mg by mouth daily.    10/17/2017 at Unknown time  . Fluticasone-Umeclidin-Vilant (TRELEGY ELLIPTA) 100-62.5-25 MCG/INH AEPB Inhale 1 puff into the lungs daily. Rinse mouth after you use this 1 each 12 10/17/2017 at Unknown time  . furosemide (LASIX) 40 MG tablet Take 40 mg by mouth daily.   10/17/2017 at Unknown time  . guaifenesin (HUMIBID E) 400 MG TABS tablet Take 400 mg by mouth every 4 (four) hours.     [name] HUMALOG KWIKPEN 100 UNIT/ML KiwkPen   0 10/17/2017  at Unknown time  . insulin glargine (LANTUS) 100 UNIT/ML injection Inject 0.16 mLs (16 Units total) into the skin daily. 10 mL 11 10/16/2017  at Unknown time  . insulin lispro (HUMALOG) 100 UNIT/ML injection Take by sliding scale provided by hospital maximum amount daily is 60 units.  Check blood sugar 4 times a day 10 mL 11 10/17/2017 at Unknown time  . ipratropium (ATROVENT) 0.02 % nebulizer solution VVN TID WITH LEVALBUTEROL  5 10/17/2017 at Unknown time  . levalbuterol (XOPENEX) 0.63 MG/3ML nebulizer solution Take 3 mLs (0.63 mg total) by nebulization every 8 (eight) hours as needed for wheezing or shortness of breath. 3 mL 12 10/17/2017 at Unknown time  . levofloxacin (LEVAQUIN) 500 MG tablet Take 1 tablet by mouth every other day.  0 10/17/2017 at Unknown time  . LORazepam (ATIVAN) 0.5 MG tablet Take 1 tablet (0.5 mg total) by mouth 2 (two) times daily as needed for anxiety or sleep. (Patient taking differently: Take 0.5 mg by mouth 4 (four) times daily as needed for anxiety or sleep. ) 20 tablet 0 10/16/2017 at Unknown time  . metFORMIN (GLUCOPHAGE) 500 MG tablet Take 1 tablet (500 mg total) by mouth daily with breakfast. 30 tablet 3 10/17/2017 at Unknown time  . montelukast (SINGULAIR) 10 MG tablet Take 10 mg by mouth at bedtime.   10/16/2017 at Unknown time  . omeprazole (PRILOSEC) 20 MG capsule Take 20 mg by mouth daily.   10/17/2017 at Unknown time  . oxyCODONE-acetaminophen (PERCOCET) 10-325 MG tablet Take 1 tablet by mouth every 4 (four) hours as needed for pain.   10/17/2017 at Unknown time  . nitroGLYCERIN (NITROSTAT) 0.4 MG SL tablet Place 1 tablet (0.4 mg total) under the tongue every 5 (five) minutes as needed for chest pain. 25 tablet 4 unknown  . predniSONE (STERAPRED UNI-PAK 21 TAB) 10 MG (21) TBPK tablet Take by package instructions (Patient not taking: Reported on 10/17/2017) 21 tablet 0 Completed Course at Unknown time   Scheduled: . baclofen  20 mg Oral TID  . benzonatate  200 mg Oral TID  .  budesonide (PULMICORT) nebulizer solution  0.25 mg Nebulization BID  . diltiazem  120 mg Oral Daily  . furosemide  40 mg Intravenous Daily  . guaiFENesin  400 mg Oral Q4H  . heparin  5,000 Units Subcutaneous Q8H  . insulin aspart  0-9 Units Subcutaneous TID WC  . insulin glargine  20 Units Subcutaneous QHS  . ipratropium  0.5 mg Nebulization Q6H  . levalbuterol  0.63 mg Nebulization Q6H  . LORazepam  0.5 mg Oral BID  . methylPREDNISolone (SOLU-MEDROL) injection  40 mg Intravenous Daily  . metoprolol tartrate  10 mg Intravenous Once  . montelukast  10 mg Oral QHS  . pantoprazole  40 mg Oral Daily   Continuous: . levofloxacin (LEVAQUIN) IV Stopped (10/19/17 0344)   YKZ:[id number] **OR** acetaminophen, HYDROmorphone, levalbuterol, nitroGLYCERIN, ondansetron **OR** ondansetron (ZOFRAN) IV, oxyCODONE-acetaminophen  Assesment: He was admitted with acute hypoxic respiratory failure.  At baseline he has COPD with chronic hypoxic respiratory failure some element of pulmonary fibrosis and he has Mycobacterium avium complex.  I am awaiting sensitivities for the mycobacterial disease.  His treatment for that has been fragmented  He has visual defects from the ethambutol  He has edema of the lower extremities but his cardiac ejection fraction was normal with diastolic dysfunction.  He did not show evidence of right heart failure which I was concerned about  He has chronic low back pain and current medications are not working with him coughing as much as he is  He has diabetes which is doing okay  He has  anxiety and I have changed his medication Principal Problem:   Acute respiratory failure with hypoxia (HCC) Active Problems:   Type 2 diabetes mellitus without complication (HCC)   Paroxysmal atrial fibrillation (HCC)   Pulmonary fibrosis (HCC)   Anemia of chronic disease   Lower extremity edema   COPD with acute exacerbation ([place])    Plan: Continue treatments.  Change his Lasix to  IV    LOS: 2 days   Mahayla Haddaway L 10/19/2017, 8:20 AM

## 2017-10-19 NOTE — Progress Notes (Signed)
Inpatient Diabetes Program Recommendations  AACE/ADA: New Consensus Statement on Inpatient Glycemic Control (2015)  Target Ranges:  Prepandial:   less than 140 mg/dL      Peak postprandial:   less than 180 mg/dL (1-2 hours)      Critically ill patients:  140 - 180 mg/dL   Lab Results  Component Value Date   GLUCAP 242 (H) 10/19/2017   HGBA1C 7.2 (H) 05/01/2015    Review of Glycemic Control Results for Ryan [name], Ryan [name] (MRN [id number]) as of 10/19/2017 11:07  Ref. Range 10/18/2017 07:26 10/18/2017 11:25 10/18/2017 16:31 10/18/2017 21:17 10/19/2017 08:05  Glucose-Capillary Latest Ref Range: 65 - 99 mg/dL 275 (H) 362 (H) 254 (H) 275 (H) 242 (H)   Diabetes history: DM Outpatient Diabetes medications: Lantus 16 units qd + Humalog tid with meals + Metformin 500 mg qd  Current orders for Inpatient glycemic control: Lantus 20 units + Novolog sensitive correction tid  Inpatient Diabetes Program Recommendations:   -Add Novolog 5 units tid meal coverage if eats 50% -Add Novolog hs correction 0-5 units  Thank you, Bethena Roys E. Yadir Zentner, RN, MSN, CDE  Diabetes Coordinator Inpatient Glycemic Control Team Team Pager [phone] (8am-5pm) 10/19/2017 11:12 AM

## 2017-10-19 NOTE — Progress Notes (Signed)
[facility]  Patient Status: Active (receiving services up to time of hospitalization)  [facility] is providing the following services: RN  If patient discharges after hours, please call [phone].   [place] 10/19/2017, 12:24 PM

## 2017-10-19 NOTE — Care Management Note (Signed)
Case Management Note  Patient Details  Name: Ryan [name] MRN: [id number] Date of Birth: [birth year]  Subjective/Objective:   acute hypoxic respiratory failure.  At baseline he has COPD with chronic hypoxic respiratory failure some element of pulmonary fibrosis and he has Mycobacterium avium complex. Recently in hospital from 3/5 through 3/24.  From home with wife, ind with ADL's. Has home oxygen and PCP, no transportation or medication needs.  Pt. continues to smoke. He is active with [facility]. Juliann Pulse of [facility] aware of admission. Per chart review, patient is also active with [facility].              Action/Plan: DC home with resumption of home health. Expected Discharge Date:  10/20/17               Expected Discharge Plan:  [place]  In-House Referral:     Discharge planning Services  CM Consult  Post Acute Care Choice:    Choice offered to:     DME Arranged:    DME Agency:     HH Arranged:    HH Agency:     Status of Service:  In process, will continue to follow  If discussed at Long Length of Stay Meetings, dates discussed:    Additional Comments:  Weyman Bogdon, Chauncey Reading, RN 10/19/2017, 11:57 AM

## 2017-10-20 LAB — GLUCOSE, CAPILLARY
GLUCOSE-CAPILLARY: 226 mg/dL — AB (ref 65–99)
Glucose-Capillary: 209 mg/dL — ABNORMAL HIGH (ref 65–99)
Glucose-Capillary: 351 mg/dL — ABNORMAL HIGH (ref 65–99)
Glucose-Capillary: 301 mg/dL — ABNORMAL HIGH (ref 65–99)

## 2017-10-20 MED ORDER — INSULIN ASPART 100 UNIT/ML ~~LOC~~ SOLN
5.0000 [IU] | Freq: Three times a day (TID) | SUBCUTANEOUS | Status: DC
  Administered 2017-10-20 – 2017-10-24 (×12): 5 [IU] via SUBCUTANEOUS

## 2017-10-20 MED ORDER — INSULIN ASPART 100 UNIT/ML ~~LOC~~ SOLN
5.0000 [IU] | Freq: Once | SUBCUTANEOUS | Status: AC
  Administered 2017-10-20: 5 [IU] via SUBCUTANEOUS

## 2017-10-20 MED ORDER — GUAIFENESIN ER 600 MG PO TB12
1200.0000 mg | ORAL_TABLET | Freq: Two times a day (BID) | ORAL | Status: DC
  Administered 2017-10-20 – 2017-10-24 (×9): 1200 mg via ORAL
  Filled 2017-10-20 (×9): qty 2

## 2017-10-20 NOTE — Progress Notes (Addendum)
Inpatient Diabetes Program Recommendations  AACE/ADA: New Consensus Statement on Inpatient Glycemic Control (2015)  Target Ranges:  Prepandial:   less than 140 mg/dL      Peak postprandial:   less than 180 mg/dL (1-2 hours)      Critically ill patients:  140 - 180 mg/dL   Lab Results  Component Value Date   GLUCAP 209 (H) 10/20/2017   HGBA1C 7.2 (H) 05/01/2015    Review of Glycemic Control Results for Ryan [name], Ryan [name] (MRN [id number]) as of 10/20/2017 10:50  Ref. Range 10/19/2017 08:05 10/19/2017 11:24 10/19/2017 16:17 10/19/2017 21:18 10/20/2017 07:49  Glucose-Capillary Latest Ref Range: 65 - 99 mg/dL 242 (H) 296 (H) 321 (H) 413 (H) 209 (H)   Diabetes history: DM Outpatient Diabetes medications: Lantus 16 units qd + Humalog tid with meals + Metformin 500 mg qd  Current orders for Inpatient glycemic control: Lantus 20 units + Novolog sensitive correction tid  Inpatient Diabetes Program Recommendations:   -Add Novolog 5 units tid meal coverage if eats 50% -Add Novolog hs correction 0-5 units Called Dr. Luan Pulling office with above recommendations.Office staff to give Dr. Luan Pulling message.  Thank you, Ryan [name]. Ryan Maready, RN, MSN, CDE  Diabetes Coordinator Inpatient Glycemic Control Team Team Pager [phone] (8am-5pm) 10/20/2017 10:50 AM

## 2017-10-20 NOTE — Progress Notes (Signed)
Subjective: He says he feels okay.  He had a lot of cough last night.  It was not productive.  No other new complaints.  We are still awaiting sensitivities on his Mycobacterium avium  Objective: Vital signs in last 24 hours: Temp:  [97.7 F (36.5 C)-98.1 F (36.7 C)] 97.7 F (36.5 C) (04/09 0547) Pulse Rate:  [65-93] 93 (04/09 0547) Resp:  [18-20] 20 (04/09 0547) BP: (111-121)/(71-82) 121/82 (04/09 0547) SpO2:  [93 %-98 %] 98 % (04/09 0817) Weight:  [69.2 kg (152 lb 9.6 oz)] 69.2 kg (152 lb 9.6 oz) (04/09 0500) Weight change:  Last BM Date: 10/19/17  Intake/Output from previous day: 04/08 0701 - 04/09 0700 In: 1200 [P.O.:1200] Out: 1850 [Urine:1850]  PHYSICAL EXAM General appearance: alert, cooperative and no distress Resp: His chest is clear with diminished breath sounds bilaterally Cardio: regular rate and rhythm, S1, S2 normal, no murmur, click, rub or gallop GI: soft, non-tender; bowel sounds normal; no masses,  no organomegaly Extremities: Still 1-2+ edema  Lab Results:  Results for orders placed or performed during the hospital encounter of 10/17/17 (from the past 48 hour(s))  Culture, expectorated sputum-assessment     Status: None   Collection Time: 10/18/17 10:42 AM  Result Value Ref Range   Specimen Description SPUTUM    Special Requests NONE    Sputum evaluation      THIS SPECIMEN IS ACCEPTABLE FOR SPUTUM CULTURE Performed at [facility], [address] [place], [place] [address]    Report Status 10/18/2017 FINAL   Culture, respiratory (NON-Expectorated)     Status: None (Preliminary result)   Collection Time: 10/18/17 10:42 AM  Result Value Ref Range   Specimen Description      SPUTUM Performed at [facility], [address], [place], [place] [address]    Special Requests      NONE Reflexed from [id number] Performed at [facility], [address] [place], [place] [address]    Gram Stain      MODERATE WBC PRESENT, PREDOMINANTLY PMN FEW GRAM POSITIVE  COCCI IN CLUSTERS RARE YEAST RARE GRAM NEGATIVE RODS Performed at [facility], [place] [address] [place], [place] [address]    Culture PENDING    Report Status PENDING   Glucose, capillary     Status: Abnormal   Collection Time: 10/18/17 11:25 AM  Result Value Ref Range   Glucose-Capillary 362 (H) 65 - 99 mg/dL  Glucose, capillary     Status: Abnormal   Collection Time: 10/18/17  4:31 PM  Result Value Ref Range   Glucose-Capillary 254 (H) 65 - 99 mg/dL  Glucose, capillary     Status: Abnormal   Collection Time: 10/18/17  9:17 PM  Result Value Ref Range   Glucose-Capillary 275 (H) 65 - 99 mg/dL  Glucose, capillary     Status: Abnormal   Collection Time: 10/19/17  8:05 AM  Result Value Ref Range   Glucose-Capillary 242 (H) 65 - 99 mg/dL  Glucose, capillary     Status: Abnormal   Collection Time: 10/19/17 11:24 AM  Result Value Ref Range   Glucose-Capillary 296 (H) 65 - 99 mg/dL  Glucose, capillary     Status: Abnormal   Collection Time: 10/19/17  4:17 PM  Result Value Ref Range   Glucose-Capillary 321 (H) 65 - 99 mg/dL  Glucose, capillary     Status: Abnormal   Collection Time: 10/19/17  9:18 PM  Result Value Ref Range   Glucose-Capillary 413 (H) 65 - 99  mg/dL  Glucose, capillary     Status: Abnormal   Collection Time: 10/20/17  7:49 AM  Result Value Ref Range   Glucose-Capillary 209 (H) 65 - 99 mg/dL    ABGS No results for input(s): PHART, PO2ART, TCO2, HCO3 in the last 72 hours.  Invalid input(s): PCO2 CULTURES Recent Results (from the past 240 hour(s))  Culture, blood (Routine x 2)     Status: None (Preliminary result)   Collection Time: 10/17/17  8:37 PM  Result Value Ref Range Status   Specimen Description BLOOD RIGHT WRIST  Final   Special Requests   Final    BOTTLES DRAWN AEROBIC AND ANAEROBIC Blood Culture adequate volume   Culture   Final    NO GROWTH 3 DAYS Performed at [facility], [address] [place], [place] [address]    Report Status  PENDING  Incomplete  Culture, blood (Routine x 2)     Status: None (Preliminary result)   Collection Time: 10/17/17  9:20 PM  Result Value Ref Range Status   Specimen Description RIGHT ANTECUBITAL  Final   Special Requests   Final    BOTTLES DRAWN AEROBIC AND ANAEROBIC Blood Culture adequate volume   Culture   Final    NO GROWTH 3 DAYS Performed at [facility], [address], [place], [place] [address]    Report Status PENDING  Incomplete  Culture, expectorated sputum-assessment     Status: None   Collection Time: 10/18/17 10:42 AM  Result Value Ref Range Status   Specimen Description SPUTUM  Final   Special Requests NONE  Final   Sputum evaluation   Final    THIS SPECIMEN IS ACCEPTABLE FOR SPUTUM CULTURE Performed at [facility], [address] [place], [place] [address]    Report Status 10/18/2017 FINAL  Final  Culture, respiratory (NON-Expectorated)     Status: None (Preliminary result)   Collection Time: 10/18/17 10:42 AM  Result Value Ref Range Status   Specimen Description   Final    SPUTUM Performed at [facility], [address] [place], [place] [address]    Special Requests   Final    NONE Reflexed from [id number] Performed at [facility], [address] [place], [place] [address]    Gram Stain   Final    MODERATE WBC PRESENT, PREDOMINANTLY PMN FEW GRAM POSITIVE COCCI IN CLUSTERS RARE YEAST RARE GRAM NEGATIVE RODS Performed at [facility], [place] [address] [place], [place] [address]    Culture PENDING  Incomplete   Report Status PENDING  Incomplete   Studies/Results: US Venous Img Lower Bilateral  Result Date: 10/18/2017 CLINICAL DATA:  57 year old male with bilateral lower extremity edema for the past 3 days EXAM: BILATERAL LOWER EXTREMITY VENOUS DOPPLER ULTRASOUND TECHNIQUE: Gray-scale sonography with graded compression, as well as color Doppler and duplex ultrasound were performed to evaluate the lower extremity deep venous systems from the level of the  common femoral vein and including the common femoral, femoral, profunda femoral, popliteal and calf veins including the posterior tibial, peroneal and gastrocnemius veins when visible. The superficial great saphenous vein was also interrogated. Spectral Doppler was utilized to evaluate flow at rest and with distal augmentation maneuvers in the common femoral, femoral and popliteal veins. COMPARISON:  None. FINDINGS: RIGHT LOWER EXTREMITY Common Femoral Vein: No evidence of thrombus. Normal compressibility, respiratory phasicity and response to augmentation. Saphenofemoral Junction: No evidence of thrombus. Normal compressibility and flow on color Doppler imaging. Profunda Femoral Vein: No evidence of  thrombus. Normal compressibility and flow on color Doppler imaging. Femoral Vein: No evidence of thrombus. Normal compressibility, respiratory phasicity and response to augmentation. Popliteal Vein: No evidence of thrombus. Normal compressibility, respiratory phasicity and response to augmentation. Calf Veins: No evidence of thrombus. Normal compressibility and flow on color Doppler imaging. Superficial Great Saphenous Vein: No evidence of thrombus. Normal compressibility. Venous Reflux:  None. Other Findings:  None. LEFT LOWER EXTREMITY Common Femoral Vein: No evidence of thrombus. Normal compressibility, respiratory phasicity and response to augmentation. Saphenofemoral Junction: No evidence of thrombus. Normal compressibility and flow on color Doppler imaging. Profunda Femoral Vein: No evidence of thrombus. Normal compressibility and flow on color Doppler imaging. Femoral Vein: No evidence of thrombus. Normal compressibility, respiratory phasicity and response to augmentation. Popliteal Vein: No evidence of thrombus. Normal compressibility, respiratory phasicity and response to augmentation. Calf Veins: No evidence of thrombus. Normal compressibility and flow on color Doppler imaging. Superficial Great Saphenous  Vein: No evidence of thrombus. Normal compressibility. Venous Reflux:  None. Other Findings:  None. IMPRESSION: No evidence of deep venous thrombosis. Electronically Signed   By: Jacqulynn Cadet M.D.   On: 10/18/2017 11:18    Medications:  Prior to Admission:  Medications Prior to Admission  Medication Sig Dispense Refill Last Dose  . albuterol (VENTOLIN HFA) 108 (90 BASE) MCG/ACT inhaler Inhale 2 puffs into the lungs every 6 (six) hours as needed for wheezing or shortness of breath.   10/17/2017 at Unknown time  . azithromycin (ZITHROMAX Z-PAK) 250 MG tablet Take by package instructions 6 each 0 Past Month at Unknown time  . baclofen (LIORESAL) 20 MG tablet Take 20 mg by mouth 3 (three) times daily.    10/16/2017 at Unknown time  . benzonatate (TESSALON) 200 MG capsule Take 200 mg by mouth 3 (three) times daily.   10/16/2017 at Unknown time  . CARTIA XT 120 MG 24 hr capsule Take 120 mg by mouth daily.    10/17/2017 at Unknown time  . Fluticasone-Umeclidin-Vilant (TRELEGY ELLIPTA) 100-62.5-25 MCG/INH AEPB Inhale 1 puff into the lungs daily. Rinse mouth after you use this 1 each 12 10/17/2017 at Unknown time  . furosemide (LASIX) 40 MG tablet Take 40 mg by mouth daily.   10/17/2017 at Unknown time  . guaifenesin (HUMIBID E) 400 MG TABS tablet Take 400 mg by mouth every 4 (four) hours.     [name] HUMALOG KWIKPEN 100 UNIT/ML KiwkPen   0 10/17/2017 at Unknown time  . insulin glargine (LANTUS) 100 UNIT/ML injection Inject 0.16 mLs (16 Units total) into the skin daily. 10 mL 11 10/16/2017 at Unknown time  . insulin lispro (HUMALOG) 100 UNIT/ML injection Take by sliding scale provided by hospital maximum amount daily is 60 units.  Check blood sugar 4 times a day 10 mL 11 10/17/2017 at Unknown time  . ipratropium (ATROVENT) 0.02 % nebulizer solution VVN TID WITH LEVALBUTEROL  5 10/17/2017 at Unknown time  . levalbuterol (XOPENEX) 0.63 MG/3ML nebulizer solution Take 3 mLs (0.63 mg total) by nebulization every 8 (eight) hours as  needed for wheezing or shortness of breath. 3 mL 12 10/17/2017 at Unknown time  . levofloxacin (LEVAQUIN) 500 MG tablet Take 1 tablet by mouth every other day.  0 10/17/2017 at Unknown time  . LORazepam (ATIVAN) 0.5 MG tablet Take 1 tablet (0.5 mg total) by mouth 2 (two) times daily as needed for anxiety or sleep. (Patient taking differently: Take 0.5 mg by mouth 4 (four) times daily as needed for anxiety or sleep. )  20 tablet 0 10/16/2017 at Unknown time  . metFORMIN (GLUCOPHAGE) 500 MG tablet Take 1 tablet (500 mg total) by mouth daily with breakfast. 30 tablet 3 10/17/2017 at Unknown time  . montelukast (SINGULAIR) 10 MG tablet Take 10 mg by mouth at bedtime.   10/16/2017 at Unknown time  . omeprazole (PRILOSEC) 20 MG capsule Take 20 mg by mouth daily.   10/17/2017 at Unknown time  . oxyCODONE-acetaminophen (PERCOCET) 10-325 MG tablet Take 1 tablet by mouth every 4 (four) hours as needed for pain.   10/17/2017 at Unknown time  . nitroGLYCERIN (NITROSTAT) 0.4 MG SL tablet Place 1 tablet (0.4 mg total) under the tongue every 5 (five) minutes as needed for chest pain. 25 tablet 4 unknown  . predniSONE (STERAPRED UNI-PAK 21 TAB) 10 MG (21) TBPK tablet Take by package instructions (Patient not taking: Reported on 10/17/2017) 21 tablet 0 Completed Course at Unknown time   Scheduled: . baclofen  20 mg Oral TID  . benzonatate  200 mg Oral TID  . budesonide (PULMICORT) nebulizer solution  0.25 mg Nebulization BID  . diltiazem  120 mg Oral Daily  . furosemide  40 mg Intravenous Daily  . guaiFENesin  1,200 mg Oral BID  . heparin  5,000 Units Subcutaneous Q8H  . insulin aspart  0-9 Units Subcutaneous TID WC  . insulin glargine  20 Units Subcutaneous QHS  . ipratropium  0.5 mg Nebulization Q6H  . levalbuterol  0.63 mg Nebulization Q6H  . LORazepam  0.5 mg Oral BID  . methylPREDNISolone (SOLU-MEDROL) injection  40 mg Intravenous Daily  . metoprolol tartrate  10 mg Intravenous Once  . montelukast  10 mg Oral QHS  .  pantoprazole  40 mg Oral Daily   Continuous: . levofloxacin (LEVAQUIN) IV Stopped (10/20/17 0240)   XUX:YBFXOVANVBTYO **OR** acetaminophen, HYDROmorphone, levalbuterol, nitroGLYCERIN, ondansetron **OR** ondansetron (ZOFRAN) IV, oxyCODONE-acetaminophen  Assesment: He was admitted with acute hypoxic respiratory failure.  He has some element of pulmonary fibrosis.  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  He has had swelling of his legs likely acute on chronic diastolic heart failure.  He has COPD exacerbation. Principal Problem:   Acute respiratory failure with hypoxia (HCC) Active Problems:   Type 2 diabetes mellitus without complication (HCC)   Paroxysmal atrial fibrillation (HCC)   Pulmonary fibrosis (HCC)   Anemia of chronic disease   Lower extremity edema   COPD with acute exacerbation (HCC)    Plan: Continue current treatments add flutter valve.    LOS: 3 days   Stephanie Mcglone L 10/20/2017, 8:37 AM

## 2017-10-21 LAB — GLUCOSE, CAPILLARY
GLUCOSE-CAPILLARY: 365 mg/dL — AB (ref 65–99)
GLUCOSE-CAPILLARY: 227 mg/dL — AB (ref 65–99)
Glucose-Capillary: 256 mg/dL — ABNORMAL HIGH (ref 65–99)
Glucose-Capillary: 286 mg/dL — ABNORMAL HIGH (ref 65–99)

## 2017-10-21 LAB — CULTURE, RESPIRATORY W GRAM STAIN: Culture: NORMAL

## 2017-10-21 LAB — CULTURE, RESPIRATORY

## 2017-10-21 MED ORDER — SALINE SPRAY 0.65 % NA SOLN
1.0000 | NASAL | Status: DC | PRN
  Administered 2017-10-22: 1 via NASAL
  Filled 2017-10-21: qty 44

## 2017-10-21 MED ORDER — INSULIN GLARGINE 100 UNIT/ML ~~LOC~~ SOLN
25.0000 [IU] | Freq: Every day | SUBCUTANEOUS | Status: DC
  Administered 2017-10-21: 25 [IU] via SUBCUTANEOUS
  Filled 2017-10-21: qty 0.25

## 2017-10-21 MED ORDER — PREDNISONE 20 MG PO TABS
40.0000 mg | ORAL_TABLET | Freq: Every day | ORAL | Status: DC
  Administered 2017-10-21 – 2017-10-24 (×4): 40 mg via ORAL
  Filled 2017-10-21 (×4): qty 2

## 2017-10-21 NOTE — Progress Notes (Signed)
Pharmacy Antibiotic Note  Ryan [name] is a 57 y.o. male admitted on 10/17/2017 with pneumonia.  Pharmacy has been consulted for Levaquin dosing. 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 MD to discuss with ID.  Plan: Levaquin 562m IV q24h F/u clinical progress Monitor V/S, labs F/u plan  Height: 5' 8"  (172.7 cm) Weight: 151 lb 4.8 oz (68.6 kg) IBW/kg (Calculated) : 68.4  Temp (24hrs), Avg:97.8 F (36.6 C), Min:97.7 F (36.5 C), Max:98 F (36.7 C)  Recent Labs  Lab 10/17/17 2037 10/17/17 2216 10/18/17 0612  WBC 9.9  --  9.0  CREATININE 1.07  --  1.15  LATICACIDVEN 1.21 1.48  --     Estimated Creatinine Clearance: 67.7 mL/min (by C-G formula based on SCr of 1.15 mg/dL).    Allergies  Allergen Reactions  . Albuterol Palpitations  . 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 Influenza Vaccine Live Swelling   Antimicrobials this admission: Levaquin 4/6 (on PTA) >>   Dose adjustments this admission: n/a  Microbiology results: 4/6 BCx: pending 4/7 Sputum: pending 3/23 MRSA PCR: negative 3/9  AFB + M avium complex-> sensitivities pending Thank you for allowing pharmacy to be a part of this patient's care.  SHart Robinsons PharmD Clinical Pharmacist Pager:  [phone]/04/2018  10/21/2017 10:25 AM

## 2017-10-21 NOTE — Progress Notes (Signed)
Subjective: He says he feels okay.  He still coughing up some sputum but not as much.  He still has swelling of both legs.  No other new complaints.  His blood sugar has been elevated and I have adjusted his insulin.  He is still having pain not controlled by his current Percocet but he does have hydromorphone available.  He has chronic back pain  Objective: Vital signs in last 24 hours: Temp:  [97.7 F (36.5 C)-98 F (36.7 C)] 97.7 F (36.5 C) (04/10 0320) Pulse Rate:  [71-88] 71 (04/10 0320) Resp:  [18-21] 18 (04/10 0320) BP: (122-145)/(84-92) 144/85 (04/10 0320) SpO2:  [94 %-99 %] 99 % (04/10 0816) Weight:  [68.6 kg (151 lb 4.8 oz)] 68.6 kg (151 lb 4.8 oz) (04/10 0320) Weight change: -0.59 kg (-1 lb 4.8 oz) Last BM Date: 10/20/17  Intake/Output from previous day: 04/09 0701 - 04/10 0700 In: 720 [P.O.:720] Out: 3275 [Urine:3275]  PHYSICAL EXAM General appearance: alert, cooperative and no distress Resp: His chest is much clearer.  He is still having coughing episodes. Cardio: regular rate and rhythm, S1, S2 normal, no murmur, click, rub or gallop GI: soft, non-tender; bowel sounds normal; no masses,  no organomegaly Extremities: extremities normal, atraumatic, no cyanosis or edema  Lab Results:  Results for orders placed or performed during the hospital encounter of 10/17/17 (from the past 48 hour(s))  Glucose, capillary     Status: Abnormal   Collection Time: 10/19/17 11:24 AM  Result Value Ref Range   Glucose-Capillary 296 (H) 65 - 99 mg/dL  Glucose, capillary     Status: Abnormal   Collection Time: 10/19/17  4:17 PM  Result Value Ref Range   Glucose-Capillary 321 (H) 65 - 99 mg/dL  Glucose, capillary     Status: Abnormal   Collection Time: 10/19/17  9:18 PM  Result Value Ref Range   Glucose-Capillary 413 (H) 65 - 99 mg/dL  Glucose, capillary     Status: Abnormal   Collection Time: 10/20/17  7:49 AM  Result Value Ref Range   Glucose-Capillary 209 (H) 65 - 99 mg/dL   Glucose, capillary     Status: Abnormal   Collection Time: 10/20/17 11:07 AM  Result Value Ref Range   Glucose-Capillary 351 (H) 65 - 99 mg/dL  Glucose, capillary     Status: Abnormal   Collection Time: 10/20/17  4:27 PM  Result Value Ref Range   Glucose-Capillary 226 (H) 65 - 99 mg/dL  Glucose, capillary     Status: Abnormal   Collection Time: 10/20/17  9:43 PM  Result Value Ref Range   Glucose-Capillary 301 (H) 65 - 99 mg/dL  Glucose, capillary     Status: Abnormal   Collection Time: 10/21/17  7:55 AM  Result Value Ref Range   Glucose-Capillary 365 (H) 65 - 99 mg/dL    ABGS No results for input(s): PHART, PO2ART, TCO2, HCO3 in the last 72 hours.  Invalid input(s): PCO2 CULTURES Recent Results (from the past 240 hour(s))  Culture, blood (Routine x 2)     Status: None (Preliminary result)   Collection Time: 10/17/17  8:37 PM  Result Value Ref Range Status   Specimen Description BLOOD RIGHT WRIST  Final   Special Requests   Final    BOTTLES DRAWN AEROBIC AND ANAEROBIC Blood Culture adequate volume   Culture   Final    NO GROWTH 4 DAYS Performed at [facility], [address] [place], [place] [address]    Report Status PENDING  Incomplete  Culture, blood (Routine x 2)     Status: None (Preliminary result)   Collection Time: 10/17/17  9:20 PM  Result Value Ref Range Status   Specimen Description RIGHT ANTECUBITAL  Final   Special Requests   Final    BOTTLES DRAWN AEROBIC AND ANAEROBIC Blood Culture adequate volume   Culture   Final    NO GROWTH 4 DAYS Performed at [facility], [address] [place], [place] [address]    Report Status PENDING  Incomplete  Culture, expectorated sputum-assessment     Status: None   Collection Time: 10/18/17 10:42 AM  Result Value Ref Range Status   Specimen Description SPUTUM  Final   Special Requests NONE  Final   Sputum evaluation   Final    THIS SPECIMEN IS ACCEPTABLE FOR SPUTUM CULTURE Performed at [facility], [address] [place], [place] [address]    Report Status 10/18/2017 FINAL  Final  Culture, respiratory (NON-Expectorated)     Status: None   Collection Time: 10/18/17 10:42 AM  Result Value Ref Range Status   Specimen Description   Final    SPUTUM Performed at [facility], [address], [place], [place] [address]    Special Requests   Final    NONE Reflexed from [id number] Performed at [facility], [address], [place], [place] [address]    Gram Stain   Final    MODERATE WBC PRESENT, PREDOMINANTLY PMN FEW GRAM POSITIVE COCCI IN CLUSTERS RARE YEAST RARE GRAM NEGATIVE RODS    Culture   Final    MODERATE Consistent with normal respiratory flora. Performed at [facility], [place] [address] [place], [place] [address]    Report Status 10/21/2017 FINAL  Final   Studies/Results: No results found.  Medications:  Prior to Admission:  Medications Prior to Admission  Medication Sig Dispense Refill Last Dose  . albuterol (VENTOLIN HFA) 108 (90 BASE) MCG/ACT inhaler Inhale 2 puffs into the lungs every 6 (six) hours as needed for wheezing or shortness of breath.   10/17/2017 at Unknown time  . azithromycin (ZITHROMAX Z-PAK) 250 MG tablet Take by package instructions 6 each 0 Past Month at Unknown time  . baclofen (LIORESAL) 20 MG tablet Take 20 mg by mouth 3 (three) times daily.    10/16/2017 at Unknown time  . benzonatate (TESSALON) 200 MG capsule Take 200 mg by mouth 3 (three) times daily.   10/16/2017 at Unknown time  . CARTIA XT 120 MG 24 hr capsule Take 120 mg by mouth daily.    10/17/2017 at Unknown time  . Fluticasone-Umeclidin-Vilant (TRELEGY ELLIPTA) 100-62.5-25 MCG/INH AEPB Inhale 1 puff into the lungs daily. Rinse mouth after you use this 1 each 12 10/17/2017 at Unknown time  . furosemide (LASIX) 40 MG tablet Take 40 mg by mouth daily.   10/17/2017 at Unknown time  . guaifenesin (HUMIBID E) 400 MG TABS tablet Take 400 mg by mouth every 4 (four) hours.     [name] HUMALOG KWIKPEN 100 UNIT/ML KiwkPen   0  10/17/2017 at Unknown time  . insulin glargine (LANTUS) 100 UNIT/ML injection Inject 0.16 mLs (16 Units total) into the skin daily. 10 mL 11 10/16/2017 at Unknown time  . insulin lispro (HUMALOG) 100 UNIT/ML injection Take by sliding scale provided by hospital maximum amount daily is 60 units.  Check blood sugar 4 times a day 10 mL 11 10/17/2017 at Unknown time  . ipratropium (ATROVENT) 0.02 % nebulizer solution VVN TID WITH  LEVALBUTEROL  5 10/17/2017 at Unknown time  . levalbuterol (XOPENEX) 0.63 MG/3ML nebulizer solution Take 3 mLs (0.63 mg total) by nebulization every 8 (eight) hours as needed for wheezing or shortness of breath. 3 mL 12 10/17/2017 at Unknown time  . levofloxacin (LEVAQUIN) 500 MG tablet Take 1 tablet by mouth every other day.  0 10/17/2017 at Unknown time  . LORazepam (ATIVAN) 0.5 MG tablet Take 1 tablet (0.5 mg total) by mouth 2 (two) times daily as needed for anxiety or sleep. (Patient taking differently: Take 0.5 mg by mouth 4 (four) times daily as needed for anxiety or sleep. ) 20 tablet 0 10/16/2017 at Unknown time  . metFORMIN (GLUCOPHAGE) 500 MG tablet Take 1 tablet (500 mg total) by mouth daily with breakfast. 30 tablet 3 10/17/2017 at Unknown time  . montelukast (SINGULAIR) 10 MG tablet Take 10 mg by mouth at bedtime.   10/16/2017 at Unknown time  . omeprazole (PRILOSEC) 20 MG capsule Take 20 mg by mouth daily.   10/17/2017 at Unknown time  . oxyCODONE-acetaminophen (PERCOCET) 10-325 MG tablet Take 1 tablet by mouth every 4 (four) hours as needed for pain.   10/17/2017 at Unknown time  . nitroGLYCERIN (NITROSTAT) 0.4 MG SL tablet Place 1 tablet (0.4 mg total) under the tongue every 5 (five) minutes as needed for chest pain. 25 tablet 4 unknown  . predniSONE (STERAPRED UNI-PAK 21 TAB) 10 MG (21) TBPK tablet Take by package instructions (Patient not taking: Reported on 10/17/2017) 21 tablet 0 Completed Course at Unknown time   Scheduled: . baclofen  20 mg Oral TID  . benzonatate  200 mg Oral TID   . budesonide (PULMICORT) nebulizer solution  0.25 mg Nebulization BID  . diltiazem  120 mg Oral Daily  . furosemide  40 mg Intravenous Daily  . guaiFENesin  1,200 mg Oral BID  . heparin  5,000 Units Subcutaneous Q8H  . insulin aspart  0-9 Units Subcutaneous TID WC  . insulin aspart  5 Units Subcutaneous TID WC  . insulin glargine  25 Units Subcutaneous QHS  . ipratropium  0.5 mg Nebulization Q6H  . levalbuterol  0.63 mg Nebulization Q6H  . LORazepam  0.5 mg Oral BID  . metoprolol tartrate  10 mg Intravenous Once  . montelukast  10 mg Oral QHS  . pantoprazole  40 mg Oral Daily  . predniSONE  40 mg Oral Q breakfast   Continuous: . levofloxacin (LEVAQUIN) IV Stopped (10/21/17 0229)   VQX:[id number] **OR** acetaminophen, HYDROmorphone, levalbuterol, nitroGLYCERIN, ondansetron **OR** ondansetron (ZOFRAN) IV, oxyCODONE-acetaminophen, sodium chloride  Assesment: He was admitted with acute hypoxic respiratory failure and COPD exacerbation.  He is improving.  He has some element of pulmonary fibrosis.  His lungs are clearing.   He has diabetes and his blood sugars not controlled likely because he is on steroids so I have switched him to oral steroids and increased his insulin  He has bilateral lower extremity edema with what is probably some diastolic dysfunction.  He is on Lasix and I ordered TED hose yesterday but they were not placed so I reordered today  He has history of Mycobacterium avium complex infection and his culture is positive again for Mycobacterium avium complex but because his treatment was interrupted and fragmented I am not going to start him on anything until I see what the sensitivities are because I am concerned that he is no longer sensitive to at least rifampin and he had visual disturbance from ethambutol Principal Problem:  Acute respiratory failure with hypoxia (HCC) Active Problems:   Type 2 diabetes mellitus without complication (HCC)   Paroxysmal atrial  fibrillation (HCC)   Pulmonary fibrosis (HCC)   Anemia of chronic disease   Lower extremity edema   COPD with acute exacerbation ([place])    Plan: Continue treatments.  Switch to oral steroids.    LOS: 4 days   Kailen Hinkle L 10/21/2017, 8:38 AM

## 2017-10-21 NOTE — Care Management Important Message (Signed)
Important Message  Patient Details  Name: Ryan [name] MRN: [id number] Date of Birth: 09-03-1960   Medicare Important Message Given:  Yes    [name] 10/21/2017, 11:55 AM

## 2017-10-22 LAB — BASIC METABOLIC PANEL
ANION GAP: 16 — AB (ref 5–15)
BUN: 24 mg/dL — ABNORMAL HIGH (ref 6–20)
CALCIUM: 9.1 mg/dL (ref 8.9–10.3)
CO2: 30 mmol/L (ref 22–32)
Chloride: 94 mmol/L — ABNORMAL LOW (ref 101–111)
Creatinine, Ser: 1.02 mg/dL (ref 0.61–1.24)
Glucose, Bld: 218 mg/dL — ABNORMAL HIGH (ref 65–99)
POTASSIUM: 4 mmol/L (ref 3.5–5.1)
Sodium: 140 mmol/L (ref 135–145)

## 2017-10-22 LAB — GLUCOSE, CAPILLARY
GLUCOSE-CAPILLARY: 273 mg/dL — AB (ref 65–99)
GLUCOSE-CAPILLARY: 302 mg/dL — AB (ref 65–99)
Glucose-Capillary: 187 mg/dL — ABNORMAL HIGH (ref 65–99)
Glucose-Capillary: 197 mg/dL — ABNORMAL HIGH (ref 65–99)

## 2017-10-22 LAB — CBC WITH DIFFERENTIAL/PLATELET
BASOS ABS: 0 10*3/uL (ref 0.0–0.1)
Basophils Relative: 0 %
Eosinophils Absolute: 0.1 10*3/uL (ref 0.0–0.7)
Eosinophils Relative: 1 %
HEMATOCRIT: 36.5 % — AB (ref 39.0–52.0)
Hemoglobin: 11.4 g/dL — ABNORMAL LOW (ref 13.0–17.0)
LYMPHS ABS: 3.3 10*3/uL (ref 0.7–4.0)
Lymphocytes Relative: 35 %
MCH: 26.7 pg (ref 26.0–34.0)
MCHC: 31.2 g/dL (ref 30.0–36.0)
MCV: 85.5 fL (ref 78.0–100.0)
MONO ABS: 0.9 10*3/uL (ref 0.1–1.0)
MONOS PCT: 10 %
NEUTROS PCT: 54 %
Neutro Abs: 5 10*3/uL (ref 1.7–7.7)
PLATELETS: 289 10*3/uL (ref 150–400)
RBC: 4.27 MIL/uL (ref 4.22–5.81)
RDW: 14.4 % (ref 11.5–15.5)
WBC: 9.3 10*3/uL (ref 4.0–10.5)

## 2017-10-22 LAB — CULTURE, BLOOD (ROUTINE X 2)
CULTURE: NO GROWTH
Culture: NO GROWTH
SPECIAL REQUESTS: ADEQUATE
Special Requests: ADEQUATE

## 2017-10-22 MED ORDER — LEVOFLOXACIN 500 MG PO TABS
500.0000 mg | ORAL_TABLET | Freq: Every day | ORAL | Status: DC
  Administered 2017-10-22 – 2017-10-23 (×2): 500 mg via ORAL
  Filled 2017-10-22 (×2): qty 1

## 2017-10-22 MED ORDER — INSULIN GLARGINE 100 UNIT/ML ~~LOC~~ SOLN
30.0000 [IU] | Freq: Every day | SUBCUTANEOUS | Status: DC
  Administered 2017-10-22 – 2017-10-23 (×2): 30 [IU] via SUBCUTANEOUS
  Filled 2017-10-22 (×4): qty 0.3

## 2017-10-22 MED ORDER — FUROSEMIDE 10 MG/ML IJ SOLN
40.0000 mg | Freq: Two times a day (BID) | INTRAMUSCULAR | Status: DC
  Administered 2017-10-22 – 2017-10-24 (×5): 40 mg via INTRAVENOUS
  Filled 2017-10-22 (×5): qty 4

## 2017-10-22 NOTE — Progress Notes (Signed)
Subjective: He says he feels better.  His blood sugar is still up.  He still has some swelling of his legs.  It does look a little bit better.  He is been able to ambulate some in the room.  Objective: Vital signs in last 24 hours: Temp:  [97.6 F (36.4 C)] 97.6 F (36.4 C) (04/10 1608) Pulse Rate:  [60-82] 60 (04/11 0602) Resp:  [16-20] 18 (04/11 0602) BP: (125-139)/(70-80) 125/70 (04/11 0602) SpO2:  [95 %-100 %] 100 % (04/11 0602) Weight:  [68.6 kg (151 lb 4.1 oz)] 68.6 kg (151 lb 4.1 oz) (04/11 0500) Weight change: -0.019 kg (-0.7 oz) Last BM Date: 10/21/17  Intake/Output from previous day: 04/10 0701 - 04/11 0700 In: 720 [P.O.:720] Out: 1800 [Urine:1800]  PHYSICAL EXAM General appearance: alert, cooperative and no distress Resp: He has some Velcro rales in the left base Cardio: regular rate and rhythm, S1, S2 normal, no murmur, click, rub or gallop GI: soft, non-tender; bowel sounds normal; no masses,  no organomegaly Extremities: He still has significant edema in both legs but it is better than yesterday  Lab Results:  Results for orders placed or performed during the hospital encounter of 10/17/17 (from the past 48 hour(s))  Glucose, capillary     Status: Abnormal   Collection Time: 10/20/17 11:07 AM  Result Value Ref Range   Glucose-Capillary 351 (H) 65 - 99 mg/dL  Glucose, capillary     Status: Abnormal   Collection Time: 10/20/17  4:27 PM  Result Value Ref Range   Glucose-Capillary 226 (H) 65 - 99 mg/dL  Glucose, capillary     Status: Abnormal   Collection Time: 10/20/17  9:43 PM  Result Value Ref Range   Glucose-Capillary 301 (H) 65 - 99 mg/dL  Glucose, capillary     Status: Abnormal   Collection Time: 10/21/17  7:55 AM  Result Value Ref Range   Glucose-Capillary 365 (H) 65 - 99 mg/dL  Glucose, capillary     Status: Abnormal   Collection Time: 10/21/17 11:53 AM  Result Value Ref Range   Glucose-Capillary 227 (H) 65 - 99 mg/dL  Glucose, capillary      Status: Abnormal   Collection Time: 10/21/17  4:20 PM  Result Value Ref Range   Glucose-Capillary 256 (H) 65 - 99 mg/dL  Glucose, capillary     Status: Abnormal   Collection Time: 10/21/17  9:31 PM  Result Value Ref Range   Glucose-Capillary 286 (H) 65 - 99 mg/dL   Comment 1 Notify RN    Comment 2 Document in Chart   CBC with Differential/Platelet     Status: Abnormal (Preliminary result)   Collection Time: 10/22/17  6:49 AM  Result Value Ref Range   WBC 9.3 4.0 - 10.5 K/uL   RBC 4.27 4.22 - 5.81 MIL/uL   Hemoglobin 11.4 (L) 13.0 - 17.0 g/dL   HCT 36.5 (L) 39.0 - 52.0 %   MCV 85.5 78.0 - 100.0 fL   MCH 26.7 26.0 - 34.0 pg   MCHC 31.2 30.0 - 36.0 g/dL   RDW 14.4 11.5 - 15.5 %   Platelets 289 150 - 400 K/uL    Comment: Performed at [facility], [address], [place], [place] [address]   Neutrophils Relative % PENDING %   Neutro Abs PENDING 1.7 - 7.7 K/uL   Band Neutrophils PENDING %   Lymphocytes Relative PENDING %   Lymphs Abs PENDING 0.7 - 4.0 K/uL   Monocytes Relative PENDING %  Monocytes Absolute PENDING 0.1 - 1.0 K/uL   Eosinophils Relative PENDING %   Eosinophils Absolute PENDING 0.0 - 0.7 K/uL   Basophils Relative PENDING %   Basophils Absolute PENDING 0.0 - 0.1 K/uL   WBC Morphology PENDING    RBC Morphology PENDING    Smear Review PENDING    nRBC PENDING 0 /100 WBC   Metamyelocytes Relative PENDING %   Myelocytes PENDING %   Promyelocytes Relative PENDING %   Blasts PENDING %  Basic metabolic panel     Status: Abnormal   Collection Time: 10/22/17  6:49 AM  Result Value Ref Range   Sodium 140 135 - 145 mmol/L   Potassium 4.0 3.5 - 5.1 mmol/L   Chloride 94 (L) 101 - 111 mmol/L   CO2 30 22 - 32 mmol/L   Glucose, Bld 218 (H) 65 - 99 mg/dL   BUN 24 (H) 6 - 20 mg/dL   Creatinine, Ser 1.02 0.61 - 1.24 mg/dL   Calcium 9.1 8.9 - 10.3 mg/dL   GFR calc non Af Amer >60 >60 mL/min   GFR calc Af Amer >60 >60 mL/min    Comment: (NOTE) The eGFR has been calculated  using the CKD EPI equation. This calculation has not been validated in all clinical situations. eGFR's persistently <60 mL/min signify possible Chronic Kidney Disease.    Anion gap 16 (H) 5 - 15    Comment: Performed at [facility], [address], [place], [place] [address]  Glucose, capillary     Status: Abnormal   Collection Time: 10/22/17  7:30 AM  Result Value Ref Range   Glucose-Capillary 187 (H) 65 - 99 mg/dL    ABGS No results for input(s): PHART, PO2ART, TCO2, HCO3 in the last 72 hours.  Invalid input(s): PCO2 CULTURES Recent Results (from the past 240 hour(s))  Culture, blood (Routine x 2)     Status: None   Collection Time: 10/17/17  8:37 PM  Result Value Ref Range Status   Specimen Description BLOOD RIGHT WRIST  Final   Special Requests   Final    BOTTLES DRAWN AEROBIC AND ANAEROBIC Blood Culture adequate volume   Culture   Final    NO GROWTH 5 DAYS Performed at [facility], [address] [place], [place] [address]    Report Status 10/22/2017 FINAL  Final  Culture, blood (Routine x 2)     Status: None   Collection Time: 10/17/17  9:20 PM  Result Value Ref Range Status   Specimen Description RIGHT ANTECUBITAL  Final   Special Requests   Final    BOTTLES DRAWN AEROBIC AND ANAEROBIC Blood Culture adequate volume   Culture   Final    NO GROWTH 5 DAYS Performed at [facility], [address] [place], [place] [address]    Report Status 10/22/2017 FINAL  Final  Culture, expectorated sputum-assessment     Status: None   Collection Time: 10/18/17 10:42 AM  Result Value Ref Range Status   Specimen Description SPUTUM  Final   Special Requests NONE  Final   Sputum evaluation   Final    THIS SPECIMEN IS ACCEPTABLE FOR SPUTUM CULTURE Performed at [facility], [address], [place], [place] [address]    Report Status 10/18/2017 FINAL  Final  Culture, respiratory (NON-Expectorated)     Status: None   Collection Time: 10/18/17 10:42 AM  Result Value Ref Range  Status   Specimen Description   Final    SPUTUM Performed at [facility], [address], [place], [place] [address]    Special Requests   Final    NONE Reflexed from [id number] Performed at [facility], [address] [place], [place] [address]    Gram Stain   Final    MODERATE WBC PRESENT, PREDOMINANTLY PMN FEW GRAM POSITIVE COCCI IN CLUSTERS RARE YEAST RARE GRAM NEGATIVE RODS    Culture   Final    MODERATE Consistent with normal respiratory flora. Performed at [facility], [place] [address], [place], [place] [address]    Report Status 10/21/2017 FINAL  Final   Studies/Results: No results found.  Medications:  Prior to Admission:  Medications Prior to Admission  Medication Sig Dispense Refill Last Dose  . albuterol (VENTOLIN HFA) 108 (90 BASE) MCG/ACT inhaler Inhale 2 puffs into the lungs every 6 (six) hours as needed for wheezing or shortness of breath.   10/17/2017 at Unknown time  . azithromycin (ZITHROMAX Z-PAK) 250 MG tablet Take by package instructions 6 each 0 Past Month at Unknown time  . baclofen (LIORESAL) 20 MG tablet Take 20 mg by mouth 3 (three) times daily.    10/16/2017 at Unknown time  . benzonatate (TESSALON) 200 MG capsule Take 200 mg by mouth 3 (three) times daily.   10/16/2017 at Unknown time  . CARTIA XT 120 MG 24 hr capsule Take 120 mg by mouth daily.    10/17/2017 at Unknown time  . Fluticasone-Umeclidin-Vilant (TRELEGY ELLIPTA) 100-62.5-25 MCG/INH AEPB Inhale 1 puff into the lungs daily. Rinse mouth after you use this 1 each 12 10/17/2017 at Unknown time  . furosemide (LASIX) 40 MG tablet Take 40 mg by mouth daily.   10/17/2017 at Unknown time  . guaifenesin (HUMIBID E) 400 MG TABS tablet Take 400 mg by mouth every 4 (four) hours.     [name] HUMALOG KWIKPEN 100 UNIT/ML KiwkPen   0 10/17/2017 at Unknown time  . insulin glargine (LANTUS) 100 UNIT/ML injection Inject 0.16 mLs (16 Units total) into the skin daily. 10 mL 11 10/16/2017 at Unknown time  . insulin lispro (HUMALOG)  100 UNIT/ML injection Take by sliding scale provided by hospital maximum amount daily is 60 units.  Check blood sugar 4 times a day 10 mL 11 10/17/2017 at Unknown time  . ipratropium (ATROVENT) 0.02 % nebulizer solution VVN TID WITH LEVALBUTEROL  5 10/17/2017 at Unknown time  . levalbuterol (XOPENEX) 0.63 MG/3ML nebulizer solution Take 3 mLs (0.63 mg total) by nebulization every 8 (eight) hours as needed for wheezing or shortness of breath. 3 mL 12 10/17/2017 at Unknown time  . levofloxacin (LEVAQUIN) 500 MG tablet Take 1 tablet by mouth every other day.  0 10/17/2017 at Unknown time  . LORazepam (ATIVAN) 0.5 MG tablet Take 1 tablet (0.5 mg total) by mouth 2 (two) times daily as needed for anxiety or sleep. (Patient taking differently: Take 0.5 mg by mouth 4 (four) times daily as needed for anxiety or sleep. ) 20 tablet 0 10/16/2017 at Unknown time  . metFORMIN (GLUCOPHAGE) 500 MG tablet Take 1 tablet (500 mg total) by mouth daily with breakfast. 30 tablet 3 10/17/2017 at Unknown time  . montelukast (SINGULAIR) 10 MG tablet Take 10 mg by mouth at bedtime.   10/16/2017 at Unknown time  . omeprazole (PRILOSEC) 20 MG capsule Take 20 mg by mouth daily.   10/17/2017 at Unknown time  . oxyCODONE-acetaminophen (PERCOCET) 10-325 MG tablet Take 1 tablet by mouth every 4 (four) hours as needed for pain.  10/17/2017 at Unknown time  . nitroGLYCERIN (NITROSTAT) 0.4 MG SL tablet Place 1 tablet (0.4 mg total) under the tongue every 5 (five) minutes as needed for chest pain. 25 tablet 4 unknown  . predniSONE (STERAPRED UNI-PAK 21 TAB) 10 MG (21) TBPK tablet Take by package instructions (Patient not taking: Reported on 10/17/2017) 21 tablet 0 Completed Course at Unknown time   Scheduled: . baclofen  20 mg Oral TID  . benzonatate  200 mg Oral TID  . budesonide (PULMICORT) nebulizer solution  0.25 mg Nebulization BID  . diltiazem  120 mg Oral Daily  . furosemide  40 mg Intravenous BID  . guaiFENesin  1,200 mg Oral BID  . heparin   5,000 Units Subcutaneous Q8H  . insulin aspart  0-9 Units Subcutaneous TID WC  . insulin aspart  5 Units Subcutaneous TID WC  . insulin glargine  30 Units Subcutaneous QHS  . ipratropium  0.5 mg Nebulization Q6H  . levalbuterol  0.63 mg Nebulization Q6H  . LORazepam  0.5 mg Oral BID  . metoprolol tartrate  10 mg Intravenous Once  . montelukast  10 mg Oral QHS  . pantoprazole  40 mg Oral Daily  . predniSONE  40 mg Oral Q breakfast   Continuous: . levofloxacin (LEVAQUIN) IV Stopped (10/22/17 0103)   ZXY:[id number] **OR** acetaminophen, HYDROmorphone, levalbuterol, nitroGLYCERIN, ondansetron **OR** ondansetron (ZOFRAN) IV, oxyCODONE-acetaminophen, sodium chloride  Assesment: He was admitted with acute hypoxic respiratory failure, COPD exacerbation.  He has some element of pulmonary fibrosis as well.  He has edema of his lower legs and that is improving but not gone.  He has pulmonary mycobacterial infection and we are awaiting the results of sensitivities so that he can start treatment  He has diabetes and that has not been as well-controlled because of his illness and steroids and I switched him to prednisone yesterday and he is better. Principal Problem:   Acute respiratory failure with hypoxia (HCC) Active Problems:   Type 2 diabetes mellitus without complication (HCC)   Paroxysmal atrial fibrillation (HCC)   Pulmonary fibrosis (HCC)   Anemia of chronic disease   Lower extremity edema   COPD with acute exacerbation (HCC)    Plan: Decrease prednisone to 20 mg.  Have him take Lasix twice daily now.  Increase Lantus.  He is approaching discharge but I like his edema better and his blood sugar better before he goes    LOS: 5 days   Shahan Starks L 10/22/2017, 7:57 AM

## 2017-10-22 NOTE — Care Management Note (Addendum)
Case Management Note  Patient Details  Name: Ryan [name] MRN: [id number] Date of Birth: 1961/03/30   If discussed at Long Length of Stay Meetings, dates discussed:  10/22/2017  Additional Comments: Patient active with [facility] and [facility]. Will start [place] services with [facility]. Juliann Pulse of [facility] notified.  Minda Faas, Chauncey [place], RN 10/22/2017, 12:03 PM

## 2017-10-22 NOTE — Progress Notes (Signed)
Inpatient Diabetes Program Recommendations  AACE/ADA: New Consensus Statement on Inpatient Glycemic Control (2015)  Target Ranges:  Prepandial:   less than 140 mg/dL      Peak postprandial:   less than 180 mg/dL (1-2 hours)      Critically ill patients:  140 - 180 mg/dL   Lab Results  Component Value Date   GLUCAP 187 (H) 10/22/2017   HGBA1C 7.2 (H) 05/01/2015    Review of Glycemic Control  Diabetes history:DM Outpatient Diabetes medications:Lantus 16 units qd + Humalog tid with meals + Metformin 500 mg qd Current orders for Inpatient glycemic control:Lantus 30 units + Novolog 5 units tid meal coverage + Novolog sensitive correction tid  Inpatient Diabetes Program Recommendations:    Noted postprandial hyperglycemia -Increase Meal coverage to Novolog 10 units tid if eats 50%  Thank you, Bethena Roys E. Lannette Avellino, RN, MSN, CDE  Diabetes Coordinator Inpatient Glycemic Control Team Team Pager [phone] (8am-5pm) 10/22/2017 10:35 AM

## 2017-10-22 NOTE — Progress Notes (Signed)
Pharmacy Antibiotic Note  Ryan [name] is a 57 y.o. male admitted on 10/17/2017 with pneumonia.  Pharmacy has been consulted for Levaquin dosing.  Plan: Change Levaquin 500mg  PO q24h Dose stable for age, weight, renal function and indication. No pharmacokinetic monitoring needed. Sign off.   Height: 5\' 8"  (172.7 cm) Weight: 151 lb 4.1 oz (68.6 kg) IBW/kg (Calculated) : 68.4  Temp (24hrs), Avg:97.6 F (36.4 C), Min:97.6 F (36.4 C), Max:97.6 F (36.4 C)  Recent Labs  Lab 10/17/17 2037 10/17/17 2216 10/18/17 0612 10/22/17 0649  WBC 9.9  --  9.0 9.3  CREATININE 1.07  --  1.15 1.02  LATICACIDVEN 1.21 1.48  --   --     Estimated Creatinine Clearance: 76.4 mL/min (by C-G formula based on SCr of 1.02 mg/dL).    Allergies  Allergen Reactions  . Albuterol Palpitations  . 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 Influenza Vaccine Live Swelling    Antimicrobials this admission: Levaquin 4/6 (on PTA) >>   Dose adjustments this admission: n/a  Microbiology results: 4/6 BCx: (-) 4/7 Sputum: (-) 3/23 MRSA PCR: (-) 3/9  AFB + M avium complex-> (-)  Thank you for allowing pharmacy to be a part of this patient's care.  Pricilla Larsson, [facility]  10/22/2017 11:38 AM

## 2017-10-23 LAB — GLUCOSE, CAPILLARY
GLUCOSE-CAPILLARY: 158 mg/dL — AB (ref 65–99)
GLUCOSE-CAPILLARY: 319 mg/dL — AB (ref 65–99)
GLUCOSE-CAPILLARY: 205 mg/dL — AB (ref 65–99)
Glucose-Capillary: 136 mg/dL — ABNORMAL HIGH (ref 65–99)

## 2017-10-23 LAB — BASIC METABOLIC PANEL
ANION GAP: 14 (ref 5–15)
BUN: 29 mg/dL — AB (ref 6–20)
CALCIUM: 8.9 mg/dL (ref 8.9–10.3)
CO2: 33 mmol/L — AB (ref 22–32)
Chloride: 90 mmol/L — ABNORMAL LOW (ref 101–111)
Creatinine, Ser: 1.17 mg/dL (ref 0.61–1.24)
GFR calc Af Amer: >60 mL/min (ref >60)
GFR calc non Af Amer: >60 mL/min (ref >60)
GLUCOSE: 145 mg/dL — AB (ref 65–99)
Potassium: 3.5 mmol/L (ref 3.5–5.1)
Sodium: 137 mmol/L (ref 135–145)

## 2017-10-23 LAB — ACID FAST CULTURE WITH REFLEXED SENSITIVITIES: ACID FAST CULTURE - AFSCU3: POSITIVE — AB

## 2017-10-23 LAB — MAC SUSCEPTIBILITY BROTH
Amikacin: 8
Ciprofloxacin: 4
Clarithromycin: 1
Ethambutol: 4
Linezolid: 16
Rifampin: 1
Streptomycin: 8

## 2017-10-23 LAB — AFB ORGANISM ID BY DNA PROBE
M avium complex: POSITIVE — AB
M tuberculosis complex: NEGATIVE

## 2017-10-23 NOTE — Progress Notes (Signed)
Subjective: He says he feels okay.  He is still having a lot of swelling of his legs.  He still coughing productively.  He is moving around some in the room but still short of breath with exertion.  Objective: Vital signs in last 24 hours: Temp:  [97.6 F (36.4 C)-98.1 F (36.7 C)] 98.1 F (36.7 C) (04/12 0558) Pulse Rate:  [78-80] 79 (04/12 0558) Resp:  [20] 20 (04/12 0558) BP: (112-125)/(75-83) 112/83 (04/12 0558) SpO2:  [94 %-100 %] 100 % (04/12 0754) Weight:  [69.7 kg (153 lb 10.6 oz)] 69.7 kg (153 lb 10.6 oz) (04/12 0558) Weight change: 1.09 kg (2 lb 6.5 oz) Last BM Date: 10/22/17  Intake/Output from previous day: 04/11 0701 - 04/12 0700 In: 960 [P.O.:960] Out: 4400 [Urine:4400]  PHYSICAL EXAM General appearance: alert, cooperative and mild distress Resp: rhonchi bilaterally Cardio: regular rate and rhythm, S1, S2 normal, no murmur, click, rub or gallop GI: soft, non-tender; bowel sounds normal; no masses,  no organomegaly Extremities: Still 2+ edema  Lab Results:  Results for orders placed or performed during the hospital encounter of 10/17/17 (from the past 48 hour(s))  Glucose, capillary     Status: Abnormal   Collection Time: 10/21/17 11:53 AM  Result Value Ref Range   Glucose-Capillary 227 (H) 65 - 99 mg/dL  Glucose, capillary     Status: Abnormal   Collection Time: 10/21/17  4:20 PM  Result Value Ref Range   Glucose-Capillary 256 (H) 65 - 99 mg/dL  Glucose, capillary     Status: Abnormal   Collection Time: 10/21/17  9:31 PM  Result Value Ref Range   Glucose-Capillary 286 (H) 65 - 99 mg/dL   Comment 1 Notify RN    Comment 2 Document in Chart   CBC with Differential/Platelet     Status: Abnormal   Collection Time: 10/22/17  6:49 AM  Result Value Ref Range   WBC 9.3 4.0 - 10.5 K/uL   RBC 4.27 4.22 - 5.81 MIL/uL   Hemoglobin 11.4 (L) 13.0 - 17.0 g/dL   HCT 36.5 (L) 39.0 - 52.0 %   MCV 85.5 78.0 - 100.0 fL   MCH 26.7 26.0 - 34.0 pg   MCHC 31.2 30.0 - 36.0  g/dL   RDW 14.4 11.5 - 15.5 %   Platelets 289 150 - 400 K/uL   Neutrophils Relative % 54 %   Lymphocytes Relative 35 %   Monocytes Relative 10 %   Eosinophils Relative 1 %   Basophils Relative 0 %   Neutro Abs 5.0 1.7 - 7.7 K/uL   Lymphs Abs 3.3 0.7 - 4.0 K/uL   Monocytes Absolute 0.9 0.1 - 1.0 K/uL   Eosinophils Absolute 0.1 0.0 - 0.7 K/uL   Basophils Absolute 0.0 0.0 - 0.1 K/uL   WBC Morphology MILD LEFT SHIFT (1-5% METAS, OCC MYELO, OCC BANDS)     Comment: Performed at [facility], [address], [place], [place] [address]  Basic metabolic panel     Status: Abnormal   Collection Time: 10/22/17  6:49 AM  Result Value Ref Range   Sodium 140 135 - 145 mmol/L   Potassium 4.0 3.5 - 5.1 mmol/L   Chloride 94 (L) 101 - 111 mmol/L   CO2 30 22 - 32 mmol/L   Glucose, Bld 218 (H) 65 - 99 mg/dL   BUN 24 (H) 6 - 20 mg/dL   Creatinine, Ser 1.02 0.61 - 1.24 mg/dL   Calcium 9.1 8.9 - 10.3 mg/dL  GFR calc non Af Amer >60 >60 mL/min   GFR calc Af Amer >60 >60 mL/min    Comment: (NOTE) The eGFR has been calculated using the CKD EPI equation. This calculation has not been validated in all clinical situations. eGFR's persistently <60 mL/min signify possible Chronic Kidney Disease.    Anion gap 16 (H) 5 - 15    Comment: Performed at [facility], [address] [place], [place] [address]  Glucose, capillary     Status: Abnormal   Collection Time: 10/22/17  7:30 AM  Result Value Ref Range   Glucose-Capillary 187 (H) 65 - 99 mg/dL  Glucose, capillary     Status: Abnormal   Collection Time: 10/22/17 11:00 AM  Result Value Ref Range   Glucose-Capillary 273 (H) 65 - 99 mg/dL  Glucose, capillary     Status: Abnormal   Collection Time: 10/22/17  4:48 PM  Result Value Ref Range   Glucose-Capillary 302 (H) 65 - 99 mg/dL  Glucose, capillary     Status: Abnormal   Collection Time: 10/22/17  8:57 PM  Result Value Ref Range   Glucose-Capillary 197 (H) 65 - 99 mg/dL  Basic metabolic panel      Status: Abnormal   Collection Time: 10/23/17  6:27 AM  Result Value Ref Range   Sodium 137 135 - 145 mmol/L   Potassium 3.5 3.5 - 5.1 mmol/L   Chloride 90 (L) 101 - 111 mmol/L   CO2 33 (H) 22 - 32 mmol/L   Glucose, Bld 145 (H) 65 - 99 mg/dL   BUN 29 (H) 6 - 20 mg/dL   Creatinine, Ser 1.17 0.61 - 1.24 mg/dL   Calcium 8.9 8.9 - 10.3 mg/dL   GFR calc non Af Amer >60 >60 mL/min   GFR calc Af Amer >60 >60 mL/min    Comment: (NOTE) The eGFR has been calculated using the CKD EPI equation. This calculation has not been validated in all clinical situations. eGFR's persistently <60 mL/min signify possible Chronic Kidney Disease.    Anion gap 14 5 - 15    Comment: Performed at [facility], [address] [place], [place] [address]  Glucose, capillary     Status: Abnormal   Collection Time: 10/23/17  7:51 AM  Result Value Ref Range   Glucose-Capillary 136 (H) 65 - 99 mg/dL   Comment 1 Notify RN    Comment 2 Document in Chart     ABGS No results for input(s): PHART, PO2ART, TCO2, HCO3 in the last 72 hours.  Invalid input(s): PCO2 CULTURES Recent Results (from the past 240 hour(s))  Culture, blood (Routine x 2)     Status: None   Collection Time: 10/17/17  8:37 PM  Result Value Ref Range Status   Specimen Description BLOOD RIGHT WRIST  Final   Special Requests   Final    BOTTLES DRAWN AEROBIC AND ANAEROBIC Blood Culture adequate volume   Culture   Final    NO GROWTH 5 DAYS Performed at [facility], [address] [place], [place] [address]    Report Status 10/22/2017 FINAL  Final  Culture, blood (Routine x 2)     Status: None   Collection Time: 10/17/17  9:20 PM  Result Value Ref Range Status   Specimen Description RIGHT ANTECUBITAL  Final   Special Requests   Final    BOTTLES DRAWN AEROBIC AND ANAEROBIC Blood Culture adequate volume   Culture   Final    NO GROWTH 5 DAYS Performed at  [facility], [address], [place], [place] [address]    Report Status 10/22/2017 FINAL   Final  Culture, expectorated sputum-assessment     Status: None   Collection Time: 10/18/17 10:42 AM  Result Value Ref Range Status   Specimen Description SPUTUM  Final   Special Requests NONE  Final   Sputum evaluation   Final    THIS SPECIMEN IS ACCEPTABLE FOR SPUTUM CULTURE Performed at [facility], [address] [place], [place] [address]    Report Status 10/18/2017 FINAL  Final  Culture, respiratory (NON-Expectorated)     Status: None   Collection Time: 10/18/17 10:42 AM  Result Value Ref Range Status   Specimen Description   Final    SPUTUM Performed at [facility], [address], [place], [place] [address]    Special Requests   Final    NONE Reflexed from [id number] Performed at [facility], [address] [place], [place] [address]    Gram Stain   Final    MODERATE WBC PRESENT, PREDOMINANTLY PMN FEW GRAM POSITIVE COCCI IN CLUSTERS RARE YEAST RARE GRAM NEGATIVE RODS    Culture   Final    MODERATE Consistent with normal respiratory flora. Performed at [facility], [place] [address] [place], [place] [address]    Report Status 10/21/2017 FINAL  Final   Studies/Results: No results found.  Medications:  Prior to Admission:  Medications Prior to Admission  Medication Sig Dispense Refill Last Dose  . albuterol (VENTOLIN HFA) 108 (90 BASE) MCG/ACT inhaler Inhale 2 puffs into the lungs every 6 (six) hours as needed for wheezing or shortness of breath.   10/17/2017 at Unknown time  . azithromycin (ZITHROMAX Z-PAK) 250 MG tablet Take by package instructions 6 each 0 Past Month at Unknown time  . baclofen (LIORESAL) 20 MG tablet Take 20 mg by mouth 3 (three) times daily.    10/16/2017 at Unknown time  . benzonatate (TESSALON) 200 MG capsule Take 200 mg by mouth 3 (three) times daily.   10/16/2017 at Unknown time  . CARTIA XT 120 MG 24 hr capsule Take 120 mg by mouth daily.    10/17/2017 at Unknown time  . Fluticasone-Umeclidin-Vilant (TRELEGY ELLIPTA) 100-62.5-25 MCG/INH AEPB  Inhale 1 puff into the lungs daily. Rinse mouth after you use this 1 each 12 10/17/2017 at Unknown time  . furosemide (LASIX) 40 MG tablet Take 40 mg by mouth daily.   10/17/2017 at Unknown time  . guaifenesin (HUMIBID E) 400 MG TABS tablet Take 400 mg by mouth every 4 (four) hours.     [name] HUMALOG KWIKPEN 100 UNIT/ML KiwkPen   0 10/17/2017 at Unknown time  . insulin glargine (LANTUS) 100 UNIT/ML injection Inject 0.16 mLs (16 Units total) into the skin daily. 10 mL 11 10/16/2017 at Unknown time  . insulin lispro (HUMALOG) 100 UNIT/ML injection Take by sliding scale provided by hospital maximum amount daily is 60 units.  Check blood sugar 4 times a day 10 mL 11 10/17/2017 at Unknown time  . ipratropium (ATROVENT) 0.02 % nebulizer solution VVN TID WITH LEVALBUTEROL  5 10/17/2017 at Unknown time  . levalbuterol (XOPENEX) 0.63 MG/3ML nebulizer solution Take 3 mLs (0.63 mg total) by nebulization every 8 (eight) hours as needed for wheezing or shortness of breath. 3 mL 12 10/17/2017 at Unknown time  . levofloxacin (LEVAQUIN) 500 MG tablet Take 1 tablet by mouth every other day.  0 10/17/2017 at Unknown time  . LORazepam (ATIVAN) 0.5 MG tablet  Take 1 tablet (0.5 mg total) by mouth 2 (two) times daily as needed for anxiety or sleep. (Patient taking differently: Take 0.5 mg by mouth 4 (four) times daily as needed for anxiety or sleep. ) 20 tablet 0 10/16/2017 at Unknown time  . metFORMIN (GLUCOPHAGE) 500 MG tablet Take 1 tablet (500 mg total) by mouth daily with breakfast. 30 tablet 3 10/17/2017 at Unknown time  . montelukast (SINGULAIR) 10 MG tablet Take 10 mg by mouth at bedtime.   10/16/2017 at Unknown time  . omeprazole (PRILOSEC) 20 MG capsule Take 20 mg by mouth daily.   10/17/2017 at Unknown time  . oxyCODONE-acetaminophen (PERCOCET) 10-325 MG tablet Take 1 tablet by mouth every 4 (four) hours as needed for pain.   10/17/2017 at Unknown time  . nitroGLYCERIN (NITROSTAT) 0.4 MG SL tablet Place 1 tablet (0.4 mg total) under the  tongue every 5 (five) minutes as needed for chest pain. 25 tablet 4 unknown  . predniSONE (STERAPRED UNI-PAK 21 TAB) 10 MG (21) TBPK tablet Take by package instructions (Patient not taking: Reported on 10/17/2017) 21 tablet 0 Completed Course at Unknown time   Scheduled: . baclofen  20 mg Oral TID  . benzonatate  200 mg Oral TID  . budesonide (PULMICORT) nebulizer solution  0.25 mg Nebulization BID  . diltiazem  120 mg Oral Daily  . furosemide  40 mg Intravenous BID  . guaiFENesin  1,200 mg Oral BID  . heparin  5,000 Units Subcutaneous Q8H  . insulin aspart  0-9 Units Subcutaneous TID WC  . insulin aspart  5 Units Subcutaneous TID WC  . insulin glargine  30 Units Subcutaneous QHS  . ipratropium  0.5 mg Nebulization Q6H  . levalbuterol  0.63 mg Nebulization Q6H  . levofloxacin  500 mg Oral QHS  . LORazepam  0.5 mg Oral BID  . metoprolol tartrate  10 mg Intravenous Once  . montelukast  10 mg Oral QHS  . pantoprazole  40 mg Oral Daily  . predniSONE  40 mg Oral Q breakfast   Continuous:  KLK:[id number] **OR** acetaminophen, HYDROmorphone, levalbuterol, nitroGLYCERIN, ondansetron **OR** ondansetron (ZOFRAN) IV, oxyCODONE-acetaminophen, sodium chloride  Assesment: He was admitted with acute hypoxic respiratory failure and COPD exacerbation.  He has some element of pulmonary fibrosis as well.  He is getting better.  He is still coughing up sputum.  He has Mycobacterium avium complex disease and we are waiting for sensitivities to get him started on medications  He continues to have significant trouble with his back and he is still getting IV pain medication  He has acute on chronic diastolic heart failure and I have him on twice a day IV Lasix which we will continue  He has diabetes which is being treated with insulin Principal Problem:   Acute respiratory failure with hypoxia ([place]) Active Problems:   Type 2 diabetes mellitus without complication (HCC)   Paroxysmal atrial  fibrillation (HCC)   Pulmonary fibrosis (HCC)   Anemia of chronic disease   Lower extremity edema   COPD with acute exacerbation ([place])    Plan: Continue current treatments.    LOS: 6 days   HAWKINS,EDWARD L 10/23/2017, 8:33 AM

## 2017-10-23 NOTE — Care Management Important Message (Signed)
Important Message  Patient Details  Name: Ryan [name] MRN: [id number] Date of Birth: January 16, 1961   Medicare Important Message Given:  Yes    [name] 10/23/2017, 12:43 PM

## 2017-10-24 DIAGNOSIS — I5032 Chronic diastolic (congestive) heart failure: Secondary | ICD-10-CM | POA: Diagnosis present

## 2017-10-24 LAB — GLUCOSE, CAPILLARY
GLUCOSE-CAPILLARY: 144 mg/dL — AB (ref 65–99)
Glucose-Capillary: 164 mg/dL — ABNORMAL HIGH (ref 65–99)

## 2017-10-24 MED ORDER — INSULIN GLARGINE 100 UNIT/ML ~~LOC~~ SOLN: 24.0000 [IU] | Freq: Every day | SUBCUTANEOUS | 11 refills | Status: DC

## 2017-10-24 MED ORDER — BUDESONIDE 0.25 MG/2ML IN SUSP: 0.2500 mg | Freq: Two times a day (BID) | RESPIRATORY_TRACT | 12 refills | Status: AC

## 2017-10-24 MED ORDER — FUROSEMIDE 40 MG PO TABS: 40.0000 mg | ORAL_TABLET | Freq: Two times a day (BID) | ORAL | 12 refills | Status: DC

## 2017-10-24 MED ORDER — PREDNISONE 10 MG PO TABS: ORAL_TABLET | ORAL | 5 refills | Status: DC

## 2017-10-24 MED ORDER — CEFDINIR 300 MG PO CAPS: 300.0000 mg | ORAL_CAPSULE | Freq: Two times a day (BID) | ORAL | 2 refills | Status: DC

## 2017-10-24 NOTE — Progress Notes (Signed)
Subjective: He feels much better.  He is been able to get up and walk around.  He wants to go home.  His swelling is better.  His breathing is okay.  Objective: Vital signs in last 24 hours: Temp:  [97.4 F (36.3 C)-98.1 F (36.7 C)] 98.1 F (36.7 C) (04/13 0530) Pulse Rate:  [84-98] 98 (04/13 0530) Resp:  [18-22] 20 (04/13 0530) BP: (124-134)/(72-81) 134/80 (04/13 0530) SpO2:  [95 %-100 %] 100 % (04/13 0808) Weight:  [67.8 kg (149 lb 6.4 oz)] 67.8 kg (149 lb 6.4 oz) (04/13 0530) Weight change: -1.933 kg (-4 lb 4.2 oz) Last BM Date: 10/23/17  Intake/Output from previous day: 04/12 0701 - 04/13 0700 In: 720 [P.O.:720] Out: 1600 [Urine:1600]  PHYSICAL EXAM General appearance: alert, cooperative and no distress Resp: clear to auscultation bilaterally Cardio: regular rate and rhythm, S1, S2 normal, no murmur, click, rub or gallop GI: soft, non-tender; bowel sounds normal; no masses,  no organomegaly Extremities: He has edema of his feet  Lab Results:  Results for orders placed or performed during the hospital encounter of 10/17/17 (from the past 48 hour(s))  Glucose, capillary     Status: Abnormal   Collection Time: 10/22/17 11:00 AM  Result Value Ref Range   Glucose-Capillary 273 (H) 65 - 99 mg/dL  Glucose, capillary     Status: Abnormal   Collection Time: 10/22/17  4:48 PM  Result Value Ref Range   Glucose-Capillary 302 (H) 65 - 99 mg/dL  Glucose, capillary     Status: Abnormal   Collection Time: 10/22/17  8:57 PM  Result Value Ref Range   Glucose-Capillary 197 (H) 65 - 99 mg/dL  Basic metabolic panel     Status: Abnormal   Collection Time: 10/23/17  6:27 AM  Result Value Ref Range   Sodium 137 135 - 145 mmol/L   Potassium 3.5 3.5 - 5.1 mmol/L   Chloride 90 (L) 101 - 111 mmol/L   CO2 33 (H) 22 - 32 mmol/L   Glucose, Bld 145 (H) 65 - 99 mg/dL   BUN 29 (H) 6 - 20 mg/dL   Creatinine, Ser 1.17 0.61 - 1.24 mg/dL   Calcium 8.9 8.9 - 10.3 mg/dL   GFR calc non Af Amer >60  >60 mL/min   GFR calc Af Amer >60 >60 mL/min    Comment: (NOTE) The eGFR has been calculated using the CKD EPI equation. This calculation has not been validated in all clinical situations. eGFR's persistently <60 mL/min signify possible Chronic Kidney Disease.    Anion gap 14 5 - 15    Comment: Performed at [facility], [address] [place], [place] [address]  Glucose, capillary     Status: Abnormal   Collection Time: 10/23/17  7:51 AM  Result Value Ref Range   Glucose-Capillary 136 (H) 65 - 99 mg/dL   Comment 1 Notify RN    Comment 2 Document in Chart   Glucose, capillary     Status: Abnormal   Collection Time: 10/23/17 11:42 AM  Result Value Ref Range   Glucose-Capillary 158 (H) 65 - 99 mg/dL   Comment 1 Notify RN    Comment 2 Document in Chart   Glucose, capillary     Status: Abnormal   Collection Time: 10/23/17  5:04 PM  Result Value Ref Range   Glucose-Capillary 319 (H) 65 - 99 mg/dL   Comment 1 Notify RN    Comment 2 Document in Chart   Glucose, capillary       Status: Abnormal   Collection Time: 10/23/17  8:55 PM  Result Value Ref Range   Glucose-Capillary 205 (H) 65 - 99 mg/dL   Comment 1 Notify RN    Comment 2 Document in Chart   Glucose, capillary     Status: Abnormal   Collection Time: 10/24/17  7:43 AM  Result Value Ref Range   Glucose-Capillary 164 (H) 65 - 99 mg/dL   Comment 1 Notify RN    Comment 2 Document in Chart     ABGS No results for input(s): PHART, PO2ART, TCO2, HCO3 in the last 72 hours.  Invalid input(s): PCO2 CULTURES Recent Results (from the past 240 hour(s))  Culture, blood (Routine x 2)     Status: None   Collection Time: 10/17/17  8:37 PM  Result Value Ref Range Status   Specimen Description BLOOD RIGHT WRIST  Final   Special Requests   Final    BOTTLES DRAWN AEROBIC AND ANAEROBIC Blood Culture adequate volume   Culture   Final    NO GROWTH 5 DAYS Performed at [facility], [address] [place], [place] [address]    Report  Status 10/22/2017 FINAL  Final  Culture, blood (Routine x 2)     Status: None   Collection Time: 10/17/17  9:20 PM  Result Value Ref Range Status   Specimen Description RIGHT ANTECUBITAL  Final   Special Requests   Final    BOTTLES DRAWN AEROBIC AND ANAEROBIC Blood Culture adequate volume   Culture   Final    NO GROWTH 5 DAYS Performed at [facility], [address] [place], [place] [address]    Report Status 10/22/2017 FINAL  Final  Culture, expectorated sputum-assessment     Status: None   Collection Time: 10/18/17 10:42 AM  Result Value Ref Range Status   Specimen Description SPUTUM  Final   Special Requests NONE  Final   Sputum evaluation   Final    THIS SPECIMEN IS ACCEPTABLE FOR SPUTUM CULTURE Performed at [facility], [address] [place], [place] [address]    Report Status 10/18/2017 FINAL  Final  Culture, respiratory (NON-Expectorated)     Status: None   Collection Time: 10/18/17 10:42 AM  Result Value Ref Range Status   Specimen Description   Final    SPUTUM Performed at [facility], [address] [place], [place] [address]    Special Requests   Final    NONE Reflexed from [id number] Performed at [facility], [address] [place], [place] [address]    Gram Stain   Final    MODERATE WBC PRESENT, PREDOMINANTLY PMN FEW GRAM POSITIVE COCCI IN CLUSTERS RARE YEAST RARE GRAM NEGATIVE RODS    Culture   Final    MODERATE Consistent with normal respiratory flora. Performed at [facility], [address] [place], [place] [address]    Report Status 10/21/2017 FINAL  Final   Studies/Results: No results found.  Medications:  Prior to Admission:  Medications Prior to Admission  Medication Sig Dispense Refill Last Dose  . albuterol (VENTOLIN HFA) 108 (90 BASE) MCG/ACT inhaler Inhale 2 puffs into the lungs every 6 (six) hours as needed for wheezing or shortness of breath.   10/17/2017 at Unknown time  . azithromycin (ZITHROMAX Z-PAK) 250 MG tablet Take by  package instructions 6 each 0 Past Month at Unknown time  . baclofen (LIORESAL) 20 MG tablet Take 20 mg by mouth 3 (three) times daily.    10/16/2017 at Unknown   time  . benzonatate (TESSALON) 200 MG capsule Take 200 mg by mouth 3 (three) times daily.   10/16/2017 at Unknown time  . CARTIA XT 120 MG 24 hr capsule Take 120 mg by mouth daily.    10/17/2017 at Unknown time  . Fluticasone-Umeclidin-Vilant (TRELEGY ELLIPTA) 100-62.5-25 MCG/INH AEPB Inhale 1 puff into the lungs daily. Rinse mouth after you use this 1 each 12 10/17/2017 at Unknown time  . guaifenesin (HUMIBID E) 400 MG TABS tablet Take 400 mg by mouth every 4 (four) hours.     [name] HUMALOG KWIKPEN 100 UNIT/ML KiwkPen   0 10/17/2017 at Unknown time  . insulin lispro (HUMALOG) 100 UNIT/ML injection Take by sliding scale provided by hospital maximum amount daily is 60 units.  Check blood sugar 4 times a day 10 mL 11 10/17/2017 at Unknown time  . ipratropium (ATROVENT) 0.02 % nebulizer solution VVN TID WITH LEVALBUTEROL  5 10/17/2017 at Unknown time  . levalbuterol (XOPENEX) 0.63 MG/3ML nebulizer solution Take 3 mLs (0.63 mg total) by nebulization every 8 (eight) hours as needed for wheezing or shortness of breath. 3 mL 12 10/17/2017 at Unknown time  . levofloxacin (LEVAQUIN) 500 MG tablet Take 1 tablet by mouth every other day.  0 10/17/2017 at Unknown time  . LORazepam (ATIVAN) 0.5 MG tablet Take 1 tablet (0.5 mg total) by mouth 2 (two) times daily as needed for anxiety or sleep. (Patient taking differently: Take 0.5 mg by mouth 4 (four) times daily as needed for anxiety or sleep. ) 20 tablet 0 10/16/2017 at Unknown time  . metFORMIN (GLUCOPHAGE) 500 MG tablet Take 1 tablet (500 mg total) by mouth daily with breakfast. 30 tablet 3 10/17/2017 at Unknown time  . montelukast (SINGULAIR) 10 MG tablet Take 10 mg by mouth at bedtime.   10/16/2017 at Unknown time  . omeprazole (PRILOSEC) 20 MG capsule Take 20 mg by mouth daily.   10/17/2017 at Unknown time  .  oxyCODONE-acetaminophen (PERCOCET) 10-325 MG tablet Take 1 tablet by mouth every 4 (four) hours as needed for pain.   10/17/2017 at Unknown time  . [DISCONTINUED] furosemide (LASIX) 40 MG tablet Take 40 mg by mouth daily.   10/17/2017 at Unknown time  . [DISCONTINUED] insulin glargine (LANTUS) 100 UNIT/ML injection Inject 0.16 mLs (16 Units total) into the skin daily. 10 mL 11 10/16/2017 at Unknown time  . nitroGLYCERIN (NITROSTAT) 0.4 MG SL tablet Place 1 tablet (0.4 mg total) under the tongue every 5 (five) minutes as needed for chest pain. 25 tablet 4 unknown  . predniSONE (STERAPRED UNI-PAK 21 TAB) 10 MG (21) TBPK tablet Take by package instructions (Patient not taking: Reported on 10/17/2017) 21 tablet 0 Completed Course at Unknown time   Scheduled: . baclofen  20 mg Oral TID  . benzonatate  200 mg Oral TID  . budesonide (PULMICORT) nebulizer solution  0.25 mg Nebulization BID  . diltiazem  120 mg Oral Daily  . furosemide  40 mg Intravenous BID  . guaiFENesin  1,200 mg Oral BID  . heparin  5,000 Units Subcutaneous Q8H  . insulin aspart  0-9 Units Subcutaneous TID WC  . insulin aspart  5 Units Subcutaneous TID WC  . insulin glargine  30 Units Subcutaneous QHS  . ipratropium  0.5 mg Nebulization Q6H  . levalbuterol  0.63 mg Nebulization Q6H  . levofloxacin  500 mg Oral QHS  . LORazepam  0.5 mg Oral BID  . metoprolol tartrate  10 mg Intravenous Once  . montelukast  10 mg Oral QHS  . pantoprazole  40 mg Oral Daily  . predniSONE  40 mg Oral Q breakfast   Continuous:  PRN:acetaminophen **OR** acetaminophen, HYDROmorphone, levalbuterol, nitroGLYCERIN, ondansetron **OR** ondansetron (ZOFRAN) IV, oxyCODONE-acetaminophen, sodium chloride  Assesment: He was admitted with COPD exacerbation and acute hypoxic respiratory failure.  He has some element of acute on chronic diastolic heart failure.  He is much improved and ready for discharge.  He has diabetes which is doing better Principal Problem:    Acute respiratory failure with hypoxia (HCC) Active Problems:   Type 2 diabetes mellitus without complication (HCC)   Paroxysmal atrial fibrillation (HCC)   Pulmonary fibrosis (HCC)   Anemia of chronic disease   Lower extremity edema   COPD with acute exacerbation (HCC)    Plan: Discharge home today    LOS: 7 days   HAWKINS,EDWARD L 10/24/2017, 9:49 AM  

## 2017-10-24 NOTE — Discharge Summary (Signed)
Physician Discharge Summary  Patient ID: Ryan [name] MRN: [id number] DOB/AGE: 1961/01/19 57 y.o. Primary Care Physician:Pearly Apachito, Percell Miller, MD Admit date: 10/17/2017 Discharge date: 10/24/2017    Discharge Diagnoses:   Principal Problem:   Acute respiratory failure with hypoxia [facility]) Active Problems:   Type 2 diabetes mellitus without complication (HCC)   Paroxysmal atrial fibrillation (HCC)   Pulmonary fibrosis (HCC)   Anemia of chronic disease   Lower extremity edema   COPD with acute exacerbation (HCC)   Acute on chronic diastolic heart failure (HCC)   Allergies as of 10/24/2017      Reactions   Albuterol Palpitations   Ciprofloxacin Other (See Comments)   Patient experiences vision loss from long-term and short-term use   Influenza Vaccine Live Swelling      Medication List    STOP taking these medications   azithromycin 250 MG tablet Commonly known as:  ZITHROMAX Z-PAK   levofloxacin 500 MG tablet Commonly known as:  LEVAQUIN   predniSONE 10 MG (21) Tbpk tablet Commonly known as:  STERAPRED UNI-PAK 21 TAB Replaced by:  predniSONE 10 MG tablet     TAKE these medications   baclofen 20 MG tablet Commonly known as:  LIORESAL Take 20 mg by mouth 3 (three) times daily.   benzonatate 200 MG capsule Commonly known as:  TESSALON Take 200 mg by mouth 3 (three) times daily.   budesonide 0.25 MG/2ML nebulizer solution Commonly known as:  PULMICORT Take 2 mLs (0.25 mg total) by nebulization 2 (two) times daily.   CARTIA XT 120 MG 24 hr capsule Generic drug:  diltiazem Take 120 mg by mouth daily.   cefdinir 300 MG capsule Commonly known as:  OMNICEF Take 1 capsule (300 mg total) by mouth 2 (two) times daily.   Fluticasone-Umeclidin-Vilant 100-62.5-25 MCG/INH Aepb Commonly known as:  TRELEGY ELLIPTA Inhale 1 puff into the lungs daily. Rinse mouth after you use this   furosemide 40 MG tablet Commonly known as:  LASIX Take 1 tablet (40 mg total) by mouth 2 (two)  times daily. What changed:  when to take this   guaifenesin 400 MG Tabs tablet Commonly known as:  HUMIBID E Take 400 mg by mouth every 4 (four) hours.   insulin glargine 100 UNIT/ML injection Commonly known as:  LANTUS Inject 0.24 mLs (24 Units total) into the skin daily. 24 units daily What changed:    how much to take  additional instructions   insulin lispro 100 UNIT/ML injection Commonly known as:  HUMALOG Take by sliding scale provided by hospital maximum amount daily is 60 units.  Check blood sugar 4 times a day   HUMALOG KWIKPEN 100 UNIT/ML KiwkPen Generic drug:  insulin lispro   ipratropium 0.02 % nebulizer solution Commonly known as:  ATROVENT VVN TID WITH LEVALBUTEROL   levalbuterol 0.63 MG/3ML nebulizer solution Commonly known as:  XOPENEX Take 3 mLs (0.63 mg total) by nebulization every 8 (eight) hours as needed for wheezing or shortness of breath.   LORazepam 0.5 MG tablet Commonly known as:  ATIVAN Take 1 tablet (0.5 mg total) by mouth 2 (two) times daily as needed for anxiety or sleep. What changed:  when to take this   metFORMIN 500 MG tablet Commonly known as:  GLUCOPHAGE Take 1 tablet (500 mg total) by mouth daily with breakfast.   montelukast 10 MG tablet Commonly known as:  SINGULAIR Take 10 mg by mouth at bedtime.   nitroGLYCERIN 0.4 MG SL tablet Commonly known as:  NITROSTAT  Place 1 tablet (0.4 mg total) under the tongue every 5 (five) minutes as needed for chest pain.   omeprazole 20 MG capsule Commonly known as:  PRILOSEC Take 20 mg by mouth daily.   oxyCODONE-acetaminophen 10-325 MG tablet Commonly known as:  PERCOCET Take 1 tablet by mouth every 4 (four) hours as needed for pain.   predniSONE 10 MG tablet Commonly known as:  DELTASONE Take 2 daily for 1 week then 1 daily Replaces:  predniSONE 10 MG (21) Tbpk tablet   VENTOLIN HFA 108 (90 Base) MCG/ACT inhaler Generic drug:  albuterol Inhale 2 puffs into the lungs every 6 (six)  hours as needed for wheezing or shortness of breath.       Discharged Condition: Improved    Consults: None  Significant Diagnostic Studies: Dg Chest 2 View  Result Date: 10/17/2017 CLINICAL DATA:  Deep cough with chest pain EXAM: CHEST - 2 VIEW COMPARISON:  09/15/2017.  CT chest 09/01/2017. FINDINGS: Lungs are hyperexpanded. Chronic interstitial changes noted right lung base with bullous change in the right apex. Cavitary lesions again noted in the left mid and posterior lower lung. The cardiopericardial silhouette is within normal limits for size. The visualized bony structures of the thorax are intact. Telemetry leads overlie the chest. IMPRESSION: Marked hyperexpansion with cavitary lesions in the left mid and posterior lower lung, better seen on previous CT. Electronically Signed   By: Misty Stanley M.D.   On: 10/17/2017 21:22   US Venous Img Lower Bilateral  Result Date: 10/18/2017 CLINICAL DATA:  57 year old male with bilateral lower extremity edema for the past 3 days EXAM: BILATERAL LOWER EXTREMITY VENOUS DOPPLER ULTRASOUND TECHNIQUE: Gray-scale sonography with graded compression, as well as color Doppler and duplex ultrasound were performed to evaluate the lower extremity deep venous systems from the level of the common femoral vein and including the common femoral, femoral, profunda femoral, popliteal and calf veins including the posterior tibial, peroneal and gastrocnemius veins when visible. The superficial great saphenous vein was also interrogated. Spectral Doppler was utilized to evaluate flow at rest and with distal augmentation maneuvers in the common femoral, femoral and popliteal veins. COMPARISON:  None. FINDINGS: RIGHT LOWER EXTREMITY Common Femoral Vein: No evidence of thrombus. Normal compressibility, respiratory phasicity and response to augmentation. Saphenofemoral Junction: No evidence of thrombus. Normal compressibility and flow on color Doppler imaging. Profunda Femoral  Vein: No evidence of thrombus. Normal compressibility and flow on color Doppler imaging. Femoral Vein: No evidence of thrombus. Normal compressibility, respiratory phasicity and response to augmentation. Popliteal Vein: No evidence of thrombus. Normal compressibility, respiratory phasicity and response to augmentation. Calf Veins: No evidence of thrombus. Normal compressibility and flow on color Doppler imaging. Superficial Great Saphenous Vein: No evidence of thrombus. Normal compressibility. Venous Reflux:  None. Other Findings:  None. LEFT LOWER EXTREMITY Common Femoral Vein: No evidence of thrombus. Normal compressibility, respiratory phasicity and response to augmentation. Saphenofemoral Junction: No evidence of thrombus. Normal compressibility and flow on color Doppler imaging. Profunda Femoral Vein: No evidence of thrombus. Normal compressibility and flow on color Doppler imaging. Femoral Vein: No evidence of thrombus. Normal compressibility, respiratory phasicity and response to augmentation. Popliteal Vein: No evidence of thrombus. Normal compressibility, respiratory phasicity and response to augmentation. Calf Veins: No evidence of thrombus. Normal compressibility and flow on color Doppler imaging. Superficial Great Saphenous Vein: No evidence of thrombus. Normal compressibility. Venous Reflux:  None. Other Findings:  None. IMPRESSION: No evidence of deep venous thrombosis. Electronically Signed   By: Myrle Sheng  Laurence Ferrari M.D.   On: 10/18/2017 11:18    Lab Results: Basic Metabolic Panel: Recent Labs    10/22/17 0649 10/23/17 0627  NA 140 137  K 4.0 3.5  CL 94* 90*  CO2 30 33*  GLUCOSE 218* 145*  BUN 24* 29*  CREATININE 1.02 1.17  CALCIUM 9.1 8.9   Liver Function Tests: No results for input(s): AST, ALT, ALKPHOS, BILITOT, PROT, ALBUMIN in the last 72 hours.   CBC: Recent Labs    10/22/17 0649  WBC 9.3  NEUTROABS 5.0  HGB 11.4*  HCT 36.5*  MCV 85.5  PLT 289    Recent Results  (from the past 240 hour(s))  Culture, blood (Routine x 2)     Status: None   Collection Time: 10/17/17  8:37 PM  Result Value Ref Range Status   Specimen Description BLOOD RIGHT WRIST  Final   Special Requests   Final    BOTTLES DRAWN AEROBIC AND ANAEROBIC Blood Culture adequate volume   Culture   Final    NO GROWTH 5 DAYS Performed at [facility], [address], [place], [place] [address]    Report Status 10/22/2017 FINAL  Final  Culture, blood (Routine x 2)     Status: None   Collection Time: 10/17/17  9:20 PM  Result Value Ref Range Status   Specimen Description RIGHT ANTECUBITAL  Final   Special Requests   Final    BOTTLES DRAWN AEROBIC AND ANAEROBIC Blood Culture adequate volume   Culture   Final    NO GROWTH 5 DAYS Performed at [facility], [address] [place], [place] [address]    Report Status 10/22/2017 FINAL  Final  Culture, expectorated sputum-assessment     Status: None   Collection Time: 10/18/17 10:42 AM  Result Value Ref Range Status   Specimen Description SPUTUM  Final   Special Requests NONE  Final   Sputum evaluation   Final    THIS SPECIMEN IS ACCEPTABLE FOR SPUTUM CULTURE Performed at [facility], [address], [place], [place] [address]    Report Status 10/18/2017 FINAL  Final  Culture, respiratory (NON-Expectorated)     Status: None   Collection Time: 10/18/17 10:42 AM  Result Value Ref Range Status   Specimen Description   Final    SPUTUM Performed at [facility], [address], [place], [place] [address]    Special Requests   Final    NONE Reflexed from [id number] Performed at [facility], [address] [place], [place] [address]    Gram Stain   Final    MODERATE WBC PRESENT, PREDOMINANTLY PMN FEW GRAM POSITIVE COCCI IN CLUSTERS RARE YEAST RARE GRAM NEGATIVE RODS    Culture   Final    MODERATE Consistent with normal respiratory flora. Performed at [facility], [place] [address], [place], [place] [address]    Report Status  10/21/2017 FINAL  Final     Hospital Course: This is a 57 year old with severe COPD chronic hypoxic respiratory failure atrial fib pulmonary Mycobacterium avium complex infection, diabetes who came to the hospital because of swelling of his legs and shortness of breath.  He was started on IV antibiotics IV steroids inhaled bronchodilators and IV diuretics.  He slowly improved.  He is known to have Mycobacterium avium complex and he had been on treatment but then his treatment became fragmented and it was discontinued because of concerns about developing resistance.  He has a positive sputum now but sensitivities have not  come back.  He will need to start back on medication when the sensitivities returned.  His swelling is much better his breathing is much better his blood sugar is better.  Discharge Exam: Blood pressure 134/80, pulse 98, temperature 98.1 F (36.7 C), temperature source Oral, resp. rate 20, height _0  (1.727 m), weight 67.8 kg (149 lb 6.4 oz), SpO2 100 %. He is awake and alert.  He has minimal swelling of his feet.  His chest is relatively clear  Disposition: Home with home health services.  He will have blood work next week.  He will be on increased dose of Lasix which he can cut back once his swelling is gone.  Discharge Instructions    Face-to-face encounter (required for Medicare/Medicaid patients)   Complete by:  As directed    I Fraida Veldman L certify that this patient is under my care and that I, or a nurse practitioner or physician's assistant working with me, had a face-to-face encounter that meets the physician face-to-face encounter requirements with this patient on 10/24/2017. The encounter with the patient was in whole, or in part for the following medical condition(s) which is the primary reason for home health care (List medical condition): copd exacerbation   The encounter with the patient was in whole, or in part, for the following medical condition, which is the  primary reason for home health care:  copd exacerbation   I certify that, based on my findings, the following services are medically necessary home health services:   Nursing Physical therapy     Reason for Medically Necessary Home Health Services:  Skilled Nursing- Change/Decline in Patient Status   My clinical findings support the need for the above services:  Shortness of breath with activity   Further, I certify that my clinical findings support that this patient is homebound due to:  Shortness of Breath with activity   Home Health   Complete by:  As directed    To provide the following care/treatments:   PT RN     Please get BMET on 11/26/2017        Signed: Kristien Salatino L   10/24/2017, 9:53 AM

## 2017-10-25 DIAGNOSIS — J449 Chronic obstructive pulmonary disease, unspecified: Secondary | ICD-10-CM | POA: Diagnosis not present

## 2017-10-25 DIAGNOSIS — J9611 Chronic respiratory failure with hypoxia: Secondary | ICD-10-CM | POA: Diagnosis not present

## 2017-10-25 DIAGNOSIS — R600 Localized edema: Secondary | ICD-10-CM | POA: Diagnosis not present

## 2017-10-25 DIAGNOSIS — E43 Unspecified severe protein-calorie malnutrition: Secondary | ICD-10-CM | POA: Diagnosis not present

## 2017-10-25 DIAGNOSIS — J8410 Pulmonary fibrosis, unspecified: Secondary | ICD-10-CM | POA: Diagnosis not present

## 2017-10-25 DIAGNOSIS — E1165 Type 2 diabetes mellitus with hyperglycemia: Secondary | ICD-10-CM | POA: Diagnosis not present

## 2017-10-25 DIAGNOSIS — J984 Other disorders of lung: Secondary | ICD-10-CM | POA: Diagnosis not present

## 2017-10-25 DIAGNOSIS — E785 Hyperlipidemia, unspecified: Secondary | ICD-10-CM | POA: Diagnosis not present

## 2017-10-25 DIAGNOSIS — A310 Pulmonary mycobacterial infection: Secondary | ICD-10-CM | POA: Diagnosis not present

## 2017-10-25 DIAGNOSIS — J439 Emphysema, unspecified: Secondary | ICD-10-CM | POA: Diagnosis not present

## 2017-10-25 DIAGNOSIS — J9621 Acute and chronic respiratory failure with hypoxia: Secondary | ICD-10-CM | POA: Diagnosis not present

## 2017-10-25 DIAGNOSIS — D649 Anemia, unspecified: Secondary | ICD-10-CM | POA: Diagnosis not present

## 2017-10-25 DIAGNOSIS — Z794 Long term (current) use of insulin: Secondary | ICD-10-CM | POA: Diagnosis not present

## 2017-10-25 DIAGNOSIS — Z9981 Dependence on supplemental oxygen: Secondary | ICD-10-CM | POA: Diagnosis not present

## 2017-10-25 DIAGNOSIS — E119 Type 2 diabetes mellitus without complications: Secondary | ICD-10-CM | POA: Diagnosis not present

## 2017-10-25 DIAGNOSIS — I480 Paroxysmal atrial fibrillation: Secondary | ICD-10-CM | POA: Diagnosis not present

## 2017-10-26 DIAGNOSIS — E119 Type 2 diabetes mellitus without complications: Secondary | ICD-10-CM | POA: Diagnosis not present

## 2017-10-26 DIAGNOSIS — A310 Pulmonary mycobacterial infection: Secondary | ICD-10-CM | POA: Diagnosis not present

## 2017-10-26 DIAGNOSIS — J984 Other disorders of lung: Secondary | ICD-10-CM | POA: Diagnosis not present

## 2017-10-26 DIAGNOSIS — Z794 Long term (current) use of insulin: Secondary | ICD-10-CM | POA: Diagnosis not present

## 2017-10-26 DIAGNOSIS — Z9981 Dependence on supplemental oxygen: Secondary | ICD-10-CM | POA: Diagnosis not present

## 2017-10-26 DIAGNOSIS — J439 Emphysema, unspecified: Secondary | ICD-10-CM | POA: Diagnosis not present

## 2017-10-26 DIAGNOSIS — I480 Paroxysmal atrial fibrillation: Secondary | ICD-10-CM | POA: Diagnosis not present

## 2017-10-26 DIAGNOSIS — J9621 Acute and chronic respiratory failure with hypoxia: Secondary | ICD-10-CM | POA: Diagnosis not present

## 2017-10-28 DIAGNOSIS — A310 Pulmonary mycobacterial infection: Secondary | ICD-10-CM | POA: Diagnosis not present

## 2017-10-28 DIAGNOSIS — Z9981 Dependence on supplemental oxygen: Secondary | ICD-10-CM | POA: Diagnosis not present

## 2017-10-28 DIAGNOSIS — J9621 Acute and chronic respiratory failure with hypoxia: Secondary | ICD-10-CM | POA: Diagnosis not present

## 2017-10-28 DIAGNOSIS — Z794 Long term (current) use of insulin: Secondary | ICD-10-CM | POA: Diagnosis not present

## 2017-10-28 DIAGNOSIS — J439 Emphysema, unspecified: Secondary | ICD-10-CM | POA: Diagnosis not present

## 2017-10-28 DIAGNOSIS — E119 Type 2 diabetes mellitus without complications: Secondary | ICD-10-CM | POA: Diagnosis not present

## 2017-10-28 DIAGNOSIS — J984 Other disorders of lung: Secondary | ICD-10-CM | POA: Diagnosis not present

## 2017-10-28 DIAGNOSIS — I480 Paroxysmal atrial fibrillation: Secondary | ICD-10-CM | POA: Diagnosis not present

## 2017-10-29 ENCOUNTER — Other Ambulatory Visit: Payer: Self-pay | Admitting: *Deleted

## 2017-10-29 LAB — AFB ORGANISM ID BY DNA PROBE
M AVIUM COMPLEX: POSITIVE — AB
M TUBERCULOSIS COMPLEX: NEGATIVE

## 2017-10-29 LAB — ACID FAST CULTURE WITH REFLEXED SENSITIVITIES (MYCOBACTERIA): Acid Fast Culture: POSITIVE — AB

## 2017-10-29 NOTE — Patient Outreach (Signed)
[facility]) Care Management  10/29/2017  Ryan [name] [birth year] [id number]   Transition of care week one call one   [name] has been admitted 3 times in the last 6 months Last admission on 10/17/17 to 10/24/17 for acute respiratory failure   Plans [facility] CM left a HIPPA compliant voice message for  including [facility] CM mobile number for a return call to initiate transition of care services  Unable to reach you letter sent. Pended case for 3-4 days pending another call attempt to reach [name] Route note to MD  Joelene Millin L. Lavina Hamman, RN, BSN, [place] Coordinator [phone] week day mobile

## 2017-10-30 ENCOUNTER — Other Ambulatory Visit: Payer: Self-pay

## 2017-10-30 ENCOUNTER — Other Ambulatory Visit: Payer: Self-pay | Admitting: *Deleted

## 2017-10-30 NOTE — Patient Outreach (Signed)
[facility]) Care Management  10/30/2017  Ryan [name] 1960/07/17 [id number]  Transition of care week one, call 2 this week    CM returned a call to Ryan [name] after a missed call without a voice message noted to have been left on 10/29/17 at 2019 Ryan [name] reports he was admitted recently for respiratory issues and "they found out I did not have a blood clot in my right leg." Reports he had swelling in his feet and a negative EKG   Medicines He has been placed back on "steroids and a change in my antibiotic"  He states he recently received a letter from infectious disease to confirm an appointment for Nov 17 2017 to assist with attempting to find the appropriate antibiotic   Pending an appointment with Ryan [name], primary MD after ID MD is seen [facility] RN is visiting qod Ryan [name] from advanced home care) She visited on Sunday then Tuesday and called Ryan [name] on Monday about swelling of his feet Ryan [name] reports today his feet are not but bruised  Oxygen remains at 6 L/min [place] and has been like this for a "few years" Ryan [name] reports he has not smoked "in fifty days" but has a hx of smoking since age 22  DM He reports he can control DM with diet -cbgs remain in 100s  Wt 145 lbs is the weight he was at on "yesterday" During the outreach call he weighed 143.8 lbs today Ryan [name] reports "someone is sending out a scale and pulse oximeter to me" He believes the name of the person is Ryan [name]   Plans Follow up with Ryan [name] next week for further transition of care assessment  Ryan L. Lavina Hamman, RN, BSN, [place] Coordinator [phone] week day mobile

## 2017-11-02 DIAGNOSIS — J9621 Acute and chronic respiratory failure with hypoxia: Secondary | ICD-10-CM | POA: Diagnosis not present

## 2017-11-02 DIAGNOSIS — J439 Emphysema, unspecified: Secondary | ICD-10-CM | POA: Diagnosis not present

## 2017-11-02 DIAGNOSIS — I480 Paroxysmal atrial fibrillation: Secondary | ICD-10-CM | POA: Diagnosis not present

## 2017-11-02 DIAGNOSIS — Z794 Long term (current) use of insulin: Secondary | ICD-10-CM | POA: Diagnosis not present

## 2017-11-02 DIAGNOSIS — E119 Type 2 diabetes mellitus without complications: Secondary | ICD-10-CM | POA: Diagnosis not present

## 2017-11-02 DIAGNOSIS — J984 Other disorders of lung: Secondary | ICD-10-CM | POA: Diagnosis not present

## 2017-11-02 DIAGNOSIS — A310 Pulmonary mycobacterial infection: Secondary | ICD-10-CM | POA: Diagnosis not present

## 2017-11-02 DIAGNOSIS — Z9981 Dependence on supplemental oxygen: Secondary | ICD-10-CM | POA: Diagnosis not present

## 2017-11-03 ENCOUNTER — Ambulatory Visit: Payer: Self-pay | Admitting: *Deleted

## 2017-11-03 DIAGNOSIS — J9611 Chronic respiratory failure with hypoxia: Secondary | ICD-10-CM | POA: Diagnosis not present

## 2017-11-03 DIAGNOSIS — J449 Chronic obstructive pulmonary disease, unspecified: Secondary | ICD-10-CM | POA: Diagnosis not present

## 2017-11-03 DIAGNOSIS — Z794 Long term (current) use of insulin: Secondary | ICD-10-CM | POA: Diagnosis not present

## 2017-11-03 DIAGNOSIS — D649 Anemia, unspecified: Secondary | ICD-10-CM | POA: Diagnosis not present

## 2017-11-03 DIAGNOSIS — A310 Pulmonary mycobacterial infection: Secondary | ICD-10-CM | POA: Diagnosis not present

## 2017-11-03 DIAGNOSIS — J9621 Acute and chronic respiratory failure with hypoxia: Secondary | ICD-10-CM | POA: Diagnosis not present

## 2017-11-03 DIAGNOSIS — R600 Localized edema: Secondary | ICD-10-CM | POA: Diagnosis not present

## 2017-11-03 DIAGNOSIS — E119 Type 2 diabetes mellitus without complications: Secondary | ICD-10-CM | POA: Diagnosis not present

## 2017-11-03 DIAGNOSIS — E1165 Type 2 diabetes mellitus with hyperglycemia: Secondary | ICD-10-CM | POA: Diagnosis not present

## 2017-11-03 DIAGNOSIS — J984 Other disorders of lung: Secondary | ICD-10-CM | POA: Diagnosis not present

## 2017-11-03 DIAGNOSIS — J439 Emphysema, unspecified: Secondary | ICD-10-CM | POA: Diagnosis not present

## 2017-11-03 DIAGNOSIS — J8410 Pulmonary fibrosis, unspecified: Secondary | ICD-10-CM | POA: Diagnosis not present

## 2017-11-03 DIAGNOSIS — E785 Hyperlipidemia, unspecified: Secondary | ICD-10-CM | POA: Diagnosis not present

## 2017-11-03 DIAGNOSIS — Z9981 Dependence on supplemental oxygen: Secondary | ICD-10-CM | POA: Diagnosis not present

## 2017-11-03 DIAGNOSIS — I480 Paroxysmal atrial fibrillation: Secondary | ICD-10-CM | POA: Diagnosis not present

## 2017-11-03 DIAGNOSIS — E43 Unspecified severe protein-calorie malnutrition: Secondary | ICD-10-CM | POA: Diagnosis not present

## 2017-11-06 ENCOUNTER — Other Ambulatory Visit: Payer: Self-pay | Admitting: *Deleted

## 2017-11-06 DIAGNOSIS — R600 Localized edema: Secondary | ICD-10-CM | POA: Diagnosis not present

## 2017-11-06 DIAGNOSIS — E1165 Type 2 diabetes mellitus with hyperglycemia: Secondary | ICD-10-CM | POA: Diagnosis not present

## 2017-11-06 DIAGNOSIS — J984 Other disorders of lung: Secondary | ICD-10-CM | POA: Diagnosis not present

## 2017-11-06 DIAGNOSIS — D649 Anemia, unspecified: Secondary | ICD-10-CM | POA: Diagnosis not present

## 2017-11-06 DIAGNOSIS — J8410 Pulmonary fibrosis, unspecified: Secondary | ICD-10-CM | POA: Diagnosis not present

## 2017-11-06 DIAGNOSIS — J9611 Chronic respiratory failure with hypoxia: Secondary | ICD-10-CM | POA: Diagnosis not present

## 2017-11-06 DIAGNOSIS — E43 Unspecified severe protein-calorie malnutrition: Secondary | ICD-10-CM | POA: Diagnosis not present

## 2017-11-06 DIAGNOSIS — J439 Emphysema, unspecified: Secondary | ICD-10-CM | POA: Diagnosis not present

## 2017-11-06 DIAGNOSIS — J9621 Acute and chronic respiratory failure with hypoxia: Secondary | ICD-10-CM | POA: Diagnosis not present

## 2017-11-06 DIAGNOSIS — Z794 Long term (current) use of insulin: Secondary | ICD-10-CM | POA: Diagnosis not present

## 2017-11-06 DIAGNOSIS — E785 Hyperlipidemia, unspecified: Secondary | ICD-10-CM | POA: Diagnosis not present

## 2017-11-06 DIAGNOSIS — I480 Paroxysmal atrial fibrillation: Secondary | ICD-10-CM | POA: Diagnosis not present

## 2017-11-06 DIAGNOSIS — E119 Type 2 diabetes mellitus without complications: Secondary | ICD-10-CM | POA: Diagnosis not present

## 2017-11-06 DIAGNOSIS — Z9981 Dependence on supplemental oxygen: Secondary | ICD-10-CM | POA: Diagnosis not present

## 2017-11-06 DIAGNOSIS — J449 Chronic obstructive pulmonary disease, unspecified: Secondary | ICD-10-CM | POA: Diagnosis not present

## 2017-11-06 DIAGNOSIS — A310 Pulmonary mycobacterial infection: Secondary | ICD-10-CM | POA: Diagnosis not present

## 2017-11-06 NOTE — Patient Outreach (Signed)
[place] [facility]) Care Management  11/06/2017  Ryan [name] March 08, 1961 [id number]  Transition of care services week 2 call 1  CHF "swelling went down" denies sob, smoking,  Wt 146.4 (was 143 lbs leaving hospital) Pt reports highest weight since d/c was 147 lb but went down after he took his "fluid pill" HR low Received a call about DME from Ryan [name] health care staff poor sleep pattern Reports he "naps off and on" Still has cough that is productive of brown to yellow sputum at this time and still on antibiotics. "I feel like my antibiotic is working I don't feel like a zombie" and reports less congestion Appointment scheduled on Nov 17 2017 at 0900 with infectious disease MD  Ryan [name] to visit today to draw blood/labs.  Anemia pt not aware of this dx listed for him- anemia of chronic disease/absolute anemia. Cm provided education/discussed how anemia affects CHF patients related to low blood counts/oxygen intake/ iron/fatigue. Cm answered questions Discussed treatments   Diet -steroids "making me snack"  COPD continues with sob with increases activity Denies skin color changes of pale or gray nor fingernails or lips of a blue color  HHPT "they examined me and I don't need it" "no falls in a while.  My balance is good." They left me with exercises that I am doing.  I still get sob with lots of activity   CHF clinic - CM discussed CHF clinic program availability. Ryan [name] discussed concerns with co pays for CHF clinic as a barrier  Plan Follow up with Ryan [name] for further transition of care services next week  CM sent referral to Ryan [name] health care CHF program to also check to see if Ryan [name] has been referred previously and to make contact with his assigned staff member to check on status and possible DME Pending response   [facility] CM Care Plan Problem One     Most Recent Value  Care Plan Problem One  risk for re admission to hospital secondary to three admissions in the  last 6 months related to CHF/COPD  Role Documenting the Problem One  Care Management Coordinator  Care Plan for Problem One  Active  [facility] Long Term Goal   over the next 31 days patient will not experience hospital re admission as evidence by patient reporting and review of EPIC record during CM contact  [place] Goal Start Date  10/29/17  Interventions for Problem One Long Term Goal  transition of care services initiated, review discharge instructions, Medications, home services and DME for possible needs, Review follow up appointments  [facility] CM Short Term Goal #1   over the next 21 days patient will see infectious disease provider as ordered  [facility] CM Short Term Goal #1 Start Date  10/29/17  Interventions for Short Term Goal #1  encouraged patient to attend schedule appointment, Reminders of scheduled appointment to Infectious disease during call outreach         Ercia Crisafulli L. Lavina Hamman, RN, BSN, [place] Coordinator [phone] week day mobile

## 2017-11-10 DIAGNOSIS — I480 Paroxysmal atrial fibrillation: Secondary | ICD-10-CM | POA: Diagnosis not present

## 2017-11-10 DIAGNOSIS — J439 Emphysema, unspecified: Secondary | ICD-10-CM | POA: Diagnosis not present

## 2017-11-10 DIAGNOSIS — J9621 Acute and chronic respiratory failure with hypoxia: Secondary | ICD-10-CM | POA: Diagnosis not present

## 2017-11-10 DIAGNOSIS — E119 Type 2 diabetes mellitus without complications: Secondary | ICD-10-CM | POA: Diagnosis not present

## 2017-11-10 DIAGNOSIS — Z794 Long term (current) use of insulin: Secondary | ICD-10-CM | POA: Diagnosis not present

## 2017-11-10 DIAGNOSIS — A310 Pulmonary mycobacterial infection: Secondary | ICD-10-CM | POA: Diagnosis not present

## 2017-11-10 DIAGNOSIS — J984 Other disorders of lung: Secondary | ICD-10-CM | POA: Diagnosis not present

## 2017-11-10 DIAGNOSIS — Z9981 Dependence on supplemental oxygen: Secondary | ICD-10-CM | POA: Diagnosis not present

## 2017-11-12 DIAGNOSIS — J984 Other disorders of lung: Secondary | ICD-10-CM | POA: Diagnosis not present

## 2017-11-12 DIAGNOSIS — A310 Pulmonary mycobacterial infection: Secondary | ICD-10-CM | POA: Diagnosis not present

## 2017-11-12 DIAGNOSIS — Z794 Long term (current) use of insulin: Secondary | ICD-10-CM | POA: Diagnosis not present

## 2017-11-12 DIAGNOSIS — J439 Emphysema, unspecified: Secondary | ICD-10-CM | POA: Diagnosis not present

## 2017-11-12 DIAGNOSIS — I480 Paroxysmal atrial fibrillation: Secondary | ICD-10-CM | POA: Diagnosis not present

## 2017-11-12 DIAGNOSIS — Z9981 Dependence on supplemental oxygen: Secondary | ICD-10-CM | POA: Diagnosis not present

## 2017-11-12 DIAGNOSIS — E119 Type 2 diabetes mellitus without complications: Secondary | ICD-10-CM | POA: Diagnosis not present

## 2017-11-12 DIAGNOSIS — J9621 Acute and chronic respiratory failure with hypoxia: Secondary | ICD-10-CM | POA: Diagnosis not present

## 2017-11-13 ENCOUNTER — Other Ambulatory Visit: Payer: Self-pay | Admitting: *Deleted

## 2017-11-13 NOTE — Patient Outreach (Addendum)
[facility]) Care Management  11/13/2017  Falcon Mccaskey Lipe 01-25-1961 [id number]   Transition of care week 3  [facility] CM left a HIPPA compliant voice message for  including [facility] CM mobile number for a return call.    Plans CM will follow the call attempt workflow - Sent unsuccessful outreach letter to patient. Scheduled 2nd call attempt within 3-4 business days   Safiyyah Vasconez L. Lavina Hamman, RN, BSN, [place] Coordinator [phone] week day mobile

## 2017-11-16 DIAGNOSIS — J984 Other disorders of lung: Secondary | ICD-10-CM | POA: Diagnosis not present

## 2017-11-16 DIAGNOSIS — Z9981 Dependence on supplemental oxygen: Secondary | ICD-10-CM | POA: Diagnosis not present

## 2017-11-16 DIAGNOSIS — Z794 Long term (current) use of insulin: Secondary | ICD-10-CM | POA: Diagnosis not present

## 2017-11-16 DIAGNOSIS — J9621 Acute and chronic respiratory failure with hypoxia: Secondary | ICD-10-CM | POA: Diagnosis not present

## 2017-11-16 DIAGNOSIS — A310 Pulmonary mycobacterial infection: Secondary | ICD-10-CM | POA: Diagnosis not present

## 2017-11-16 DIAGNOSIS — E119 Type 2 diabetes mellitus without complications: Secondary | ICD-10-CM | POA: Diagnosis not present

## 2017-11-16 DIAGNOSIS — I480 Paroxysmal atrial fibrillation: Secondary | ICD-10-CM | POA: Diagnosis not present

## 2017-11-16 DIAGNOSIS — J439 Emphysema, unspecified: Secondary | ICD-10-CM | POA: Diagnosis not present

## 2017-11-17 ENCOUNTER — Ambulatory Visit: Payer: Medicare Other | Admitting: Internal Medicine

## 2017-11-17 ENCOUNTER — Encounter: Payer: Self-pay | Admitting: Internal Medicine

## 2017-11-17 VITALS — BP 137/78 | HR 88 | Temp 98.2°F | Ht 68.0 in | Wt 155.1 lb

## 2017-11-17 DIAGNOSIS — A310 Pulmonary mycobacterial infection: Secondary | ICD-10-CM

## 2017-11-17 MED ORDER — RIFAMPIN 300 MG PO CAPS: 600.0000 mg | ORAL_CAPSULE | Freq: Every day | ORAL | 11 refills | Status: DC

## 2017-11-17 MED ORDER — AZITHROMYCIN 500 MG PO TABS: 500.0000 mg | ORAL_TABLET | Freq: Every day | ORAL | 11 refills | Status: DC

## 2017-11-17 MED ORDER — ETHAMBUTOL HCL 400 MG PO TABS: 1200.0000 mg | ORAL_TABLET | Freq: Every day | ORAL | 11 refills | Status: DC

## 2017-11-17 NOTE — Progress Notes (Signed)
HPI: Ryan [name] is a 57 y.o. male who is here to see Dr. Linus Salmons for his MAC infection  Allergies: Allergies  Allergen Reactions  . Albuterol Palpitations  . 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 Influenza Vaccine Live Swelling    Vitals: Temp: 98.2 F (36.8 C) (05/07 0916) BP: 137/78 (05/07 0916) Pulse Rate: 88 (05/07 0916)  Past Medical History: Past Medical History:  Diagnosis Date  . Atrial fibrillation ([place])    Not anticoagulated  . Barrett's esophagus   . Borderline diabetes   . Bronchitis   . CAP (community acquired pneumonia) 07/10/2013   04/2015  . 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 Chronic back pain   . Chronic left shoulder pain   . Chronic neck pain   . Chronic respiratory failure ([place])   . Collagen vascular disease ([place])   . COPD (chronic obstructive pulmonary disease) ([place])   . Coronary atherosclerosis of native coronary artery    Mild nonobstructive disease at catheterization 2007  . DDD (degenerative disc disease)    Cervical and thoracic  . GERD (gastroesophageal reflux disease)   . History of pneumonia   . Hypercholesteremia   . Polycythemia   . Seizures ([place])    Last seizure 2 yrs ago  . Smoker   . Type 2 diabetes mellitus ([place])   . Type 2 diabetes mellitus without complication ([place])     Social History: Social History   Socioeconomic History  . Marital status: Married    Spouse name: [name]  . Number of children: 4  . Years of education: 10  . Highest education level: Not on file  Occupational History  . Not on file  Social Needs  . Financial resource strain: Not on file  . Food insecurity:    Worry: Not on file    Inability: Not on file  . Transportation needs:    Medical: Not on file    Non-medical: Not on file  Tobacco Use  . Smoking status: Current Every Day Smoker    Packs/day: 0.50    Years: 45.00    Pack years:  22.50    Types: Cigarettes    Last attempt to quit: 05/01/2015    Years since quitting: 2.5  . Smokeless tobacco: Never Used  Substance and Sexual Activity  . Alcohol use: No    Alcohol/week: 0.0 oz  . Drug use: No  . Sexual activity: Yes    Birth control/protection: Surgical  Lifestyle  . Physical activity:    Days per week: Not on file    Minutes per session: Not on file  . Stress: Not on file  Relationships  . Social connections:    Talks on phone: Not on file    Gets together: Not on file    Attends religious service: Not on file    Active member of club or organization: Not on file    Attends meetings of clubs or organizations: Not on file    Relationship status: Not on file  Other Topics Concern  . Not on file  Social History Narrative   Patient is married [name]) and lives at home with his wife and one child.   Patient has four children.   Patient is disabled.   Patient has a high school education.   Patient is left-handed.   Patient does not drink any caffeine.    Previous  Regimen: Cipro/Azith?  Current Regimen: None  Labs: Hepatitis B Surface Ag (no units)  Date Value  08/20/2015 Negative   HCV Ab (s/co ratio)  Date Value  08/20/2015 <0.1    CrCl: CrCl cannot be calculated (Patient's most recent lab result is older than the maximum 21 days allowed.).  Lipids:    Component Value Date/Time   CHOL 131 06/23/2011 0555   TRIG 182 (H) 07/26/2015 0440   TRIG 79 05/17/2010 0000   HDL 29 (L) 06/23/2011 0555   CHOLHDL 4.5 06/23/2011 0555   VLDL 17 06/23/2011 0555   LDLCALC 85 06/23/2011 0555   LDLCALC 101 05/17/2010 0000    Assessment: Rosalie has a long history of COPD due to years of smoking. He has now developed pulmonary MAC that has been treated by pulm but it was interrupted due to adverse effect. He and his wife described it as vision changes on Cipro. We are planning to use the triple combination with Azith/ETB/RIF. Counseled him on how to  take the regimen. Since rifampin has to be taken on an empty stomach, he will take it 2 hrs post breakfast. He will then take the others after that. Ethambutol can cause vision issue also. He is also being referred to optometry for monitor his vision monitoring.   Recommendations:  Rifampin 641m PO qday on empty stomach Azithromycin 5057mPO qday Ethambutol 120026mO qday  MinOnnie BoerharmD, BCPS, AAHIVP, CPP Clinical Infectious Disease [facility] [facility] 11/17/2017, 9:55 AM

## 2017-11-17 NOTE — Progress Notes (Signed)
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 Luan Pulling    Patient ID: Ryan [name], male    DOB: 1960-07-27, 57 y.o.   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  He was first noted to have cavitary lesions in 2017 and started then on treatment with rifampin and cipro.  He did well for about 2 years on that treatment but had to stop with concern with vision problems with cipro.  He went to an ophthalmologist and no issues found.  Since being off the medication, he has been hospitalized and in the ED mulitple times.  Recent CT noted cavitary area and  Repeat culture again with MAI.  Sensivities noted.  Currently on cefdinir for acute exacerbation.  Uses O2 24/7.   CT independently reviewed by me and with the patient and cavitary area noted.   Previous record reviewed in Epic with previousl hosptializations.    Past Medical History:  Diagnosis Date  . Atrial fibrillation ([place])    Not anticoagulated  . Barrett's esophagus   . Borderline diabetes   . Bronchitis   . CAP (community acquired pneumonia) 07/10/2013   04/2015  . 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 Chronic back pain   . Chronic left shoulder pain   . Chronic neck pain   . Chronic respiratory failure ([place])   . Collagen vascular disease ([place])   . COPD (chronic obstructive pulmonary disease) ([place])   . Coronary atherosclerosis of native coronary artery    Mild nonobstructive disease at catheterization 2007  . DDD (degenerative disc disease)    Cervical and thoracic  . GERD (gastroesophageal reflux disease)   . History of pneumonia   . Hypercholesteremia   . Polycythemia   . Seizures ([place])    Last seizure 2 yrs ago  . Smoker   . Type 2 diabetes mellitus ([place])   . Type 2 diabetes mellitus without complication ([place])     Prior to  Admission medications   Medication Sig Start Date End Date Taking? Authorizing Provider  albuterol (VENTOLIN HFA) 108 (90 BASE) MCG/ACT inhaler Inhale 2 puffs into the lungs every 6 (six) hours as needed for wheezing or shortness of breath.    [provider]  baclofen (LIORESAL) 20 MG tablet Take 20 mg by mouth 3 (three) times daily.  06/05/15   [provider]  benzonatate (TESSALON) 200 MG capsule Take 200 mg by mouth 3 (three) times daily.    [provider]  budesonide (PULMICORT) 0.25 MG/2ML nebulizer solution Take 2 mLs (0.25 mg total) by nebulization 2 (two) times daily. 10/24/17   [name], MD  CARTIA XT 120 MG 24 hr capsule Take 120 mg by mouth daily.  10/09/16   [provider]  cefdinir (OMNICEF) 300 MG capsule Take 1 capsule (300 mg total) by mouth 2 (two) times daily. 10/24/17   [name], MD  Fluticasone-Umeclidin-Vilant (TRELEGY ELLIPTA) 100-62.5-25 MCG/INH AEPB Inhale 1 puff into the lungs daily. Rinse mouth after you use this 09/23/17   [name], MD  furosemide (LASIX) 40 MG tablet Take 1 tablet (40 mg total) by mouth 2 (two) times daily. 10/24/17   [name], MD  guaifenesin (HUMIBID E) 400 MG TABS tablet Take 400 mg by mouth every 4 (four) hours.  [provider]  Ryan [name] 100 UNIT/ML [facility]  09/24/17   [provider]  insulin glargine (LANTUS) 100 UNIT/ML injection Inject 0.24 mLs (24 Units total) into the skin daily. 24 units daily 10/24/17   [name], MD  insulin lispro (HUMALOG) 100 UNIT/ML injection Take by sliding scale provided by hospital maximum amount daily is 60 units.  Check blood sugar 4 times a day 09/23/17   [name], MD  ipratropium (ATROVENT) 0.02 % nebulizer solution VVN TID WITH LEVALBUTEROL 09/29/17   [provider]  levalbuterol Penne Lash) 0.63 MG/3ML nebulizer solution Take 3 mLs (0.63 mg total) by nebulization every 8 (eight) hours as needed for wheezing  or shortness of breath. 09/05/15   [name], MD  LORazepam (ATIVAN) 0.5 MG tablet Take 1 tablet (0.5 mg total) by mouth 2 (two) times daily as needed for anxiety or sleep. Patient taking differently: Take 0.5 mg by mouth 4 (four) times daily as needed for anxiety or sleep.  05/04/15   Cristal Ford, DO  metFORMIN (GLUCOPHAGE) 500 MG tablet Take 1 tablet (500 mg total) by mouth daily with breakfast. 09/15/14   Rexene Alberts, MD  montelukast (SINGULAIR) 10 MG tablet Take 10 mg by mouth at bedtime.    [provider]  nitroGLYCERIN (NITROSTAT) 0.4 MG SL tablet Place 1 tablet (0.4 mg total) under the tongue every 5 (five) minutes as needed for chest pain. 04/22/13   Lendon Colonel, NP  omeprazole (PRILOSEC) 20 MG capsule Take 20 mg by mouth daily.    [provider]  oxyCODONE-acetaminophen (PERCOCET) 10-325 MG tablet Take 1 tablet by mouth every 4 (four) hours as needed for pain.    [provider]  predniSONE (DELTASONE) 10 MG tablet Take 2 daily for 1 week then 1 daily 10/24/17   [name], MD    Allergies  Allergen Reactions  . Albuterol Palpitations  . 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 Influenza Vaccine Live Swelling    Social History   Tobacco Use  . Smoking status: Current Every Day Smoker    Packs/day: 0.50    Years: 45.00    Pack years: 22.50    Types: Cigarettes    Last attempt to quit: 05/01/2015    Years since quitting: 2.5  . Smokeless tobacco: Never Used  Substance Use Topics  . Alcohol use: No    Alcohol/week: 0.0 oz  . Drug use: No    Family History  Problem Relation Age of Onset  . Hypertension Mother   . Diabetes Mother   . Heart attack Mother   . Heart failure Mother   . Hypertension Sister   . Diabetes Sister   . Heart failure Sister   . Colon cancer Neg Hx     Review of Systems  Constitutional: negative for fevers and  chills Respiratory: positive for cough, sputum or emphysema Gastrointestinal: negative for diarrhea Hematologic/lymphatic: negative for lymphadenopathy All other systems reviewed and are negative    Constitutional: in no apparent distress  Blood pressure 137/78, pulse 88, temperature 98.2 F (36.8 C), height _0  (1.727 m), weight 155 lb 1.9 oz (70.4 kg), SpO2 93 %. EYES: anicteric ENMT: Cardiovascular: Cor RRR Respiratory: distant; normal respiratory effort on oxygen GI: Bowel sounds are normal, liver is not enlarged, spleen is not enlarged Musculoskeletal: no pedal edema noted Skin: negatives: no rash Hematologic: no cervical lad  Labs: Lab Results  Component Value  Date   WBC 9.3 10/22/2017   HGB 11.4 (L) 10/22/2017   HCT 36.5 (L) 10/22/2017   MCV 85.5 10/22/2017   PLT 289 10/22/2017    Lab Results  Component Value Date   CREATININE 1.17 10/23/2017   BUN 29 (H) 10/23/2017   NA 137 10/23/2017   K 3.5 10/23/2017   CL 90 (L) 10/23/2017   CO2 33 (H) 10/23/2017    Lab Results  Component Value Date   ALT 42 10/17/2017   AST 34 10/17/2017   ALKPHOS 91 10/17/2017   BILITOT 0.7 10/17/2017   INR 1.17 10/17/2017     Assessment: Cavitary MAC. Sensitivities noted and will use 3 drug treatment. Optimal treatment for this will be daily rifampin and azithromycin based with ethambutol.  will also consider inhaled amikacin if he continues to be symptomatic after 2-3 months of daily therapy.  If he responds to this, can consider three times a week after 3-6 months of daily therapy. With his previous eye issues I will also have him go back to his ophthalmologist for a routine check.  I discussed expectations of treatment to include some improvement in symptoms but difficulty in curing this with a less than 60% chance of long-term cure.  I would not expect him to have much more significant return to activity.  This was discussed with him and his wife.     Plan: 1) azithromycin 500 mg  daily 2) rifampin 600 mg daily 3) ethambutol 1200 mg daily 4) referral for eye exam 5) stop cefdinir with above treatment rtc 1 month and will check LFTs, tolerance.

## 2017-11-18 ENCOUNTER — Ambulatory Visit: Payer: Self-pay | Admitting: *Deleted

## 2017-11-18 DIAGNOSIS — E785 Hyperlipidemia, unspecified: Secondary | ICD-10-CM | POA: Diagnosis not present

## 2017-11-18 DIAGNOSIS — J449 Chronic obstructive pulmonary disease, unspecified: Secondary | ICD-10-CM | POA: Diagnosis not present

## 2017-11-18 DIAGNOSIS — E43 Unspecified severe protein-calorie malnutrition: Secondary | ICD-10-CM | POA: Diagnosis not present

## 2017-11-18 DIAGNOSIS — E1165 Type 2 diabetes mellitus with hyperglycemia: Secondary | ICD-10-CM | POA: Diagnosis not present

## 2017-11-18 DIAGNOSIS — J439 Emphysema, unspecified: Secondary | ICD-10-CM | POA: Diagnosis not present

## 2017-11-18 DIAGNOSIS — R600 Localized edema: Secondary | ICD-10-CM | POA: Diagnosis not present

## 2017-11-18 DIAGNOSIS — Z9981 Dependence on supplemental oxygen: Secondary | ICD-10-CM | POA: Diagnosis not present

## 2017-11-18 DIAGNOSIS — J8410 Pulmonary fibrosis, unspecified: Secondary | ICD-10-CM | POA: Diagnosis not present

## 2017-11-18 DIAGNOSIS — Z794 Long term (current) use of insulin: Secondary | ICD-10-CM | POA: Diagnosis not present

## 2017-11-18 DIAGNOSIS — D649 Anemia, unspecified: Secondary | ICD-10-CM | POA: Diagnosis not present

## 2017-11-18 DIAGNOSIS — J984 Other disorders of lung: Secondary | ICD-10-CM | POA: Diagnosis not present

## 2017-11-18 DIAGNOSIS — J9611 Chronic respiratory failure with hypoxia: Secondary | ICD-10-CM | POA: Diagnosis not present

## 2017-11-18 DIAGNOSIS — J9621 Acute and chronic respiratory failure with hypoxia: Secondary | ICD-10-CM | POA: Diagnosis not present

## 2017-11-18 DIAGNOSIS — A310 Pulmonary mycobacterial infection: Secondary | ICD-10-CM | POA: Diagnosis not present

## 2017-11-18 DIAGNOSIS — E119 Type 2 diabetes mellitus without complications: Secondary | ICD-10-CM | POA: Diagnosis not present

## 2017-11-18 DIAGNOSIS — I480 Paroxysmal atrial fibrillation: Secondary | ICD-10-CM | POA: Diagnosis not present

## 2017-11-19 ENCOUNTER — Other Ambulatory Visit: Payer: Self-pay | Admitting: *Deleted

## 2017-11-19 ENCOUNTER — Telehealth: Payer: Self-pay

## 2017-11-19 NOTE — Patient Outreach (Addendum)
[place] [facility]) Care Management  11/19/2017  Joy [facility] Jul 05, 1961 [id number]   Care Coordination Transition of Care: Week 3   [facility] CM left a HIPPA compliant voice message for Mr. Siems including [facility] CM mobile number for a return call.    Plan CM will return a call to Mr. Mapel in 7-10 days to continue follow up for transition of care. Unsuccessful Outreach Letter sent to patient.  [place] V[place] L. Lavina Hamman, RN, BSN, [place] Coordinator [phone] week day mobile

## 2017-11-19 NOTE — Telephone Encounter (Signed)
Ryan [name] from [facility] called with patient on the phone stating that the Rifampin is a financial burden of $45 on patient and she is trying to get him assistance. She stated that if she can't get him help with financial assistance then he will be "back at square one".

## 2017-11-21 DIAGNOSIS — J9610 Chronic respiratory failure, unspecified whether with hypoxia or hypercapnia: Secondary | ICD-10-CM | POA: Diagnosis not present

## 2017-11-21 DIAGNOSIS — J439 Emphysema, unspecified: Secondary | ICD-10-CM | POA: Diagnosis not present

## 2017-11-21 DIAGNOSIS — R0902 Hypoxemia: Secondary | ICD-10-CM | POA: Diagnosis not present

## 2017-11-21 DIAGNOSIS — J449 Chronic obstructive pulmonary disease, unspecified: Secondary | ICD-10-CM | POA: Diagnosis not present

## 2017-11-23 DIAGNOSIS — J8410 Pulmonary fibrosis, unspecified: Secondary | ICD-10-CM | POA: Diagnosis not present

## 2017-11-23 DIAGNOSIS — J9611 Chronic respiratory failure with hypoxia: Secondary | ICD-10-CM | POA: Diagnosis not present

## 2017-11-23 DIAGNOSIS — D649 Anemia, unspecified: Secondary | ICD-10-CM | POA: Diagnosis not present

## 2017-11-23 DIAGNOSIS — E785 Hyperlipidemia, unspecified: Secondary | ICD-10-CM | POA: Diagnosis not present

## 2017-11-23 DIAGNOSIS — Z794 Long term (current) use of insulin: Secondary | ICD-10-CM | POA: Diagnosis not present

## 2017-11-23 DIAGNOSIS — I480 Paroxysmal atrial fibrillation: Secondary | ICD-10-CM | POA: Diagnosis not present

## 2017-11-23 DIAGNOSIS — R600 Localized edema: Secondary | ICD-10-CM | POA: Diagnosis not present

## 2017-11-23 DIAGNOSIS — E1165 Type 2 diabetes mellitus with hyperglycemia: Secondary | ICD-10-CM | POA: Diagnosis not present

## 2017-11-23 DIAGNOSIS — Z9981 Dependence on supplemental oxygen: Secondary | ICD-10-CM | POA: Diagnosis not present

## 2017-11-23 DIAGNOSIS — J449 Chronic obstructive pulmonary disease, unspecified: Secondary | ICD-10-CM | POA: Diagnosis not present

## 2017-11-23 DIAGNOSIS — E43 Unspecified severe protein-calorie malnutrition: Secondary | ICD-10-CM | POA: Diagnosis not present

## 2017-11-24 ENCOUNTER — Telehealth: Payer: Self-pay | Admitting: Pharmacist Clinician (PhC)/ Clinical Pharmacy Specialist

## 2017-11-24 ENCOUNTER — Other Ambulatory Visit: Payer: Self-pay | Admitting: *Deleted

## 2017-11-24 MED ORDER — CARTIA XT 120 MG PO CP24: 120.0000 mg | ORAL_CAPSULE | Freq: Every day | ORAL | 0 refills | Status: DC

## 2017-11-24 NOTE — Patient Outreach (Addendum)
[place] [facility]) Care Management  11/24/2017  Ryan [name] 1960-08-27 [id number]   Transition of care week 4, follow up care after no return call from message left and letter sent last week   [facility] CM left a HIPPA compliant voice message for  including [facility] CM mobile number for a return call.  [facility] encouraged a return call to CM to follow up on transition of care services   Plans CM will follow outreach call policy and plan for closure if no return call from [name] after this second message left plus an unable to reach outreach letter was sent   routed note to primary care   [place]. Lavina Hamman, RN, BSN, [place] Coordinator [phone] week day mobile

## 2017-11-24 NOTE — Telephone Encounter (Signed)
Axzel called [place] the other day to say that he has issue with copay for his MAC therapy. Around $45 for each med. Luckily, the patient assistance funds still have funding for the disease. Got him 30 day coverage settle but he has to send in some financial documents to get it approved beyond 30d. He knows to send it in once he gets the letter. The coverage info below has been given to his [facility] in [place].    ID: [id number] BIN: [id number] PCN: [id number] GRP: [id number]

## 2017-11-27 ENCOUNTER — Other Ambulatory Visit: Payer: Self-pay | Admitting: *Deleted

## 2017-11-27 ENCOUNTER — Other Ambulatory Visit: Payer: Self-pay

## 2017-11-27 ENCOUNTER — Encounter (HOSPITAL_COMMUNITY): Payer: Self-pay | Admitting: Emergency Medicine

## 2017-11-27 ENCOUNTER — Inpatient Hospital Stay (HOSPITAL_COMMUNITY)
Admission: EM | Admit: 2017-11-27 | Discharge: 2017-12-03 | DRG: 190 | Disposition: A | Discharge status: Home Health | Payer: Medicare Other | Attending: Pulmonary Disease | Admitting: Pulmonary Disease

## 2017-11-27 ENCOUNTER — Emergency Department (HOSPITAL_COMMUNITY): Payer: Medicare Other

## 2017-11-27 DIAGNOSIS — J441 Chronic obstructive pulmonary disease with (acute) exacerbation: Principal | ICD-10-CM | POA: Diagnosis present

## 2017-11-27 DIAGNOSIS — R05 Cough: Secondary | ICD-10-CM | POA: Diagnosis not present

## 2017-11-27 DIAGNOSIS — E119 Type 2 diabetes mellitus without complications: Secondary | ICD-10-CM

## 2017-11-27 DIAGNOSIS — E860 Dehydration: Secondary | ICD-10-CM | POA: Diagnosis not present

## 2017-11-27 DIAGNOSIS — Z888 Allergy status to other drugs, medicaments and biological substances status: Secondary | ICD-10-CM

## 2017-11-27 DIAGNOSIS — J9611 Chronic respiratory failure with hypoxia: Secondary | ICD-10-CM | POA: Diagnosis present

## 2017-11-27 DIAGNOSIS — E7800 Pure hypercholesterolemia, unspecified: Secondary | ICD-10-CM | POA: Diagnosis not present

## 2017-11-27 DIAGNOSIS — R0602 Shortness of breath: Secondary | ICD-10-CM | POA: Diagnosis not present

## 2017-11-27 DIAGNOSIS — Z7951 Long term (current) use of inhaled steroids: Secondary | ICD-10-CM

## 2017-11-27 DIAGNOSIS — K429 Umbilical hernia without obstruction or gangrene: Secondary | ICD-10-CM | POA: Diagnosis not present

## 2017-11-27 DIAGNOSIS — R0902 Hypoxemia: Secondary | ICD-10-CM | POA: Diagnosis not present

## 2017-11-27 DIAGNOSIS — Z794 Long term (current) use of insulin: Secondary | ICD-10-CM | POA: Diagnosis not present

## 2017-11-27 DIAGNOSIS — E785 Hyperlipidemia, unspecified: Secondary | ICD-10-CM | POA: Diagnosis not present

## 2017-11-27 DIAGNOSIS — I480 Paroxysmal atrial fibrillation: Secondary | ICD-10-CM | POA: Diagnosis present

## 2017-11-27 DIAGNOSIS — Z9981 Dependence on supplemental oxygen: Secondary | ICD-10-CM | POA: Diagnosis not present

## 2017-11-27 DIAGNOSIS — Z887 Allergy status to serum and vaccine status: Secondary | ICD-10-CM | POA: Diagnosis not present

## 2017-11-27 DIAGNOSIS — Z79899 Other long term (current) drug therapy: Secondary | ICD-10-CM | POA: Diagnosis not present

## 2017-11-27 DIAGNOSIS — J9621 Acute and chronic respiratory failure with hypoxia: Secondary | ICD-10-CM | POA: Diagnosis not present

## 2017-11-27 DIAGNOSIS — I5033 Acute on chronic diastolic (congestive) heart failure: Secondary | ICD-10-CM | POA: Diagnosis not present

## 2017-11-27 DIAGNOSIS — E43 Unspecified severe protein-calorie malnutrition: Secondary | ICD-10-CM | POA: Diagnosis present

## 2017-11-27 DIAGNOSIS — I5032 Chronic diastolic (congestive) heart failure: Secondary | ICD-10-CM | POA: Diagnosis present

## 2017-11-27 DIAGNOSIS — E876 Hypokalemia: Secondary | ICD-10-CM | POA: Diagnosis not present

## 2017-11-27 DIAGNOSIS — Z881 Allergy status to other antibiotic agents status: Secondary | ICD-10-CM | POA: Diagnosis not present

## 2017-11-27 DIAGNOSIS — Z7952 Long term (current) use of systemic steroids: Secondary | ICD-10-CM

## 2017-11-27 DIAGNOSIS — F17210 Nicotine dependence, cigarettes, uncomplicated: Secondary | ICD-10-CM | POA: Diagnosis present

## 2017-11-27 DIAGNOSIS — J9610 Chronic respiratory failure, unspecified whether with hypoxia or hypercapnia: Secondary | ICD-10-CM | POA: Diagnosis present

## 2017-11-27 DIAGNOSIS — A310 Pulmonary mycobacterial infection: Secondary | ICD-10-CM | POA: Diagnosis present

## 2017-11-27 DIAGNOSIS — I951 Orthostatic hypotension: Secondary | ICD-10-CM | POA: Diagnosis not present

## 2017-11-27 LAB — GLUCOSE, CAPILLARY
Glucose-Capillary: 335 mg/dL — ABNORMAL HIGH (ref 65–99)
Glucose-Capillary: 329 mg/dL — ABNORMAL HIGH (ref 65–99)

## 2017-11-27 LAB — BRAIN NATRIURETIC PEPTIDE: B NATRIURETIC PEPTIDE 5: 31 pg/mL (ref 0.0–100.0)

## 2017-11-27 LAB — MAGNESIUM: MAGNESIUM: 1.8 mg/dL (ref 1.7–2.4)

## 2017-11-27 LAB — CBC WITH DIFFERENTIAL/PLATELET
Basophils Absolute: 0.1 10*3/uL (ref 0.0–0.1)
Basophils Relative: 1 %
EOS ABS: 0.3 10*3/uL (ref 0.0–0.7)
Eosinophils Relative: 3 %
HEMATOCRIT: 35.8 % — AB (ref 39.0–52.0)
HEMOGLOBIN: 11.3 g/dL — AB (ref 13.0–17.0)
LYMPHS ABS: 1.9 10*3/uL (ref 0.7–4.0)
Lymphocytes Relative: 23 %
MCH: 26.3 pg (ref 26.0–34.0)
MCHC: 31.6 g/dL (ref 30.0–36.0)
MCV: 83.4 fL (ref 78.0–100.0)
MONO ABS: 0.9 10*3/uL (ref 0.1–1.0)
MONOS PCT: 11 %
NEUTROS PCT: 62 %
Neutro Abs: 5.3 10*3/uL (ref 1.7–7.7)
Platelets: 256 10*3/uL (ref 150–400)
RBC: 4.29 MIL/uL (ref 4.22–5.81)
RDW: 15 % (ref 11.5–15.5)
WBC: 8.5 10*3/uL (ref 4.0–10.5)

## 2017-11-27 LAB — BASIC METABOLIC PANEL
Anion gap: 12 (ref 5–15)
BUN: 14 mg/dL (ref 6–20)
CO2: 33 mmol/L — ABNORMAL HIGH (ref 22–32)
CREATININE: 0.93 mg/dL (ref 0.61–1.24)
Calcium: 8.9 mg/dL (ref 8.9–10.3)
Chloride: 93 mmol/L — ABNORMAL LOW (ref 101–111)
GFR calc Af Amer: >60 mL/min (ref >60)
GLUCOSE: 221 mg/dL — AB (ref 65–99)
POTASSIUM: 3.1 mmol/L — AB (ref 3.5–5.1)
SODIUM: 138 mmol/L (ref 135–145)

## 2017-11-27 LAB — TROPONIN I: Troponin I: <0.03 ng/mL (ref <0.03)

## 2017-11-27 LAB — CBG MONITORING, ED: Glucose-Capillary: 216 mg/dL — ABNORMAL HIGH (ref 65–99)

## 2017-11-27 MED ORDER — ONDANSETRON HCL 4 MG PO TABS: 4.0000 mg | ORAL_TABLET | Freq: Four times a day (QID) | ORAL | Status: DC | PRN

## 2017-11-27 MED ORDER — MONTELUKAST SODIUM 10 MG PO TABS
10.0000 mg | ORAL_TABLET | Freq: Every day | ORAL | Status: DC
  Administered 2017-11-27 – 2017-12-02 (×6): 10 mg via ORAL
  Filled 2017-11-27 (×6): qty 1

## 2017-11-27 MED ORDER — DILTIAZEM HCL ER COATED BEADS 120 MG PO CP24
120.0000 mg | ORAL_CAPSULE | Freq: Every day | ORAL | Status: DC
  Administered 2017-11-27 – 2017-12-03 (×7): 120 mg via ORAL
  Filled 2017-11-27 (×7): qty 1

## 2017-11-27 MED ORDER — LEVALBUTEROL HCL 0.63 MG/3ML IN NEBU
0.6300 mg | INHALATION_SOLUTION | Freq: Once | RESPIRATORY_TRACT | Status: AC
  Administered 2017-11-27: 0.63 mg via RESPIRATORY_TRACT
  Filled 2017-11-27: qty 3

## 2017-11-27 MED ORDER — METHYLPREDNISOLONE SODIUM SUCC 125 MG IJ SOLR
125.0000 mg | Freq: Once | INTRAMUSCULAR | Status: AC
  Administered 2017-11-27: 125 mg via INTRAVENOUS
  Filled 2017-11-27: qty 2

## 2017-11-27 MED ORDER — LEVALBUTEROL HCL 1.25 MG/0.5ML IN NEBU: 1.2500 mg | INHALATION_SOLUTION | RESPIRATORY_TRACT | Status: DC | PRN

## 2017-11-27 MED ORDER — OXYCODONE-ACETAMINOPHEN 5-325 MG PO TABS
2.0000 | ORAL_TABLET | Freq: Four times a day (QID) | ORAL | Status: DC | PRN
  Administered 2017-11-28: 2 via ORAL
  Filled 2017-11-27: qty 2

## 2017-11-27 MED ORDER — LEVALBUTEROL HCL 1.25 MG/0.5ML IN NEBU
1.2500 mg | INHALATION_SOLUTION | Freq: Three times a day (TID) | RESPIRATORY_TRACT | Status: DC
  Administered 2017-11-27 – 2017-12-03 (×18): 1.25 mg via RESPIRATORY_TRACT
  Filled 2017-11-27 (×17): qty 0.5

## 2017-11-27 MED ORDER — ONDANSETRON HCL 4 MG/2ML IJ SOLN: 4.0000 mg | Freq: Four times a day (QID) | INTRAMUSCULAR | Status: DC | PRN

## 2017-11-27 MED ORDER — BENZONATATE 100 MG PO CAPS
200.0000 mg | ORAL_CAPSULE | Freq: Three times a day (TID) | ORAL | Status: DC
  Administered 2017-11-27 – 2017-12-03 (×18): 200 mg via ORAL
  Filled 2017-11-27 (×18): qty 2

## 2017-11-27 MED ORDER — METHYLPREDNISOLONE SODIUM SUCC 125 MG IJ SOLR
60.0000 mg | Freq: Four times a day (QID) | INTRAMUSCULAR | Status: DC
  Administered 2017-11-27 – 2017-11-30 (×10): 60 mg via INTRAVENOUS
  Filled 2017-11-27 (×11): qty 2

## 2017-11-27 MED ORDER — MORPHINE SULFATE (PF) 2 MG/ML IV SOLN
2.0000 mg | Freq: Once | INTRAVENOUS | Status: AC
  Administered 2017-11-27: 2 mg via INTRAVENOUS
  Filled 2017-11-27: qty 1

## 2017-11-27 MED ORDER — SODIUM CHLORIDE 0.9 % IV SOLN
INTRAVENOUS | Status: DC
  Administered 2017-11-27 – 2017-11-29 (×4): via INTRAVENOUS

## 2017-11-27 MED ORDER — ACETAMINOPHEN 325 MG PO TABS
650.0000 mg | ORAL_TABLET | Freq: Four times a day (QID) | ORAL | Status: DC | PRN
  Administered 2017-11-27: 650 mg via ORAL
  Filled 2017-11-27: qty 2

## 2017-11-27 MED ORDER — RIFAMPIN 300 MG PO CAPS
600.0000 mg | ORAL_CAPSULE | Freq: Every day | ORAL | Status: DC
  Administered 2017-11-28 – 2017-12-03 (×6): 600 mg via ORAL
  Filled 2017-11-27 (×8): qty 2

## 2017-11-27 MED ORDER — SENNOSIDES-DOCUSATE SODIUM 8.6-50 MG PO TABS: 1.0000 | ORAL_TABLET | Freq: Every evening | ORAL | Status: DC | PRN

## 2017-11-27 MED ORDER — AZITHROMYCIN 250 MG PO TABS: 500.0000 mg | ORAL_TABLET | Freq: Every day | ORAL | Status: DC

## 2017-11-27 MED ORDER — ENOXAPARIN SODIUM 40 MG/0.4ML ~~LOC~~ SOLN
40.0000 mg | SUBCUTANEOUS | Status: DC
  Administered 2017-11-27 – 2017-12-02 (×6): 40 mg via SUBCUTANEOUS
  Filled 2017-11-27 (×6): qty 0.4

## 2017-11-27 MED ORDER — INSULIN ASPART 100 UNIT/ML ~~LOC~~ SOLN
4.0000 [IU] | Freq: Three times a day (TID) | SUBCUTANEOUS | Status: DC
  Administered 2017-11-28 – 2017-12-03 (×17): 4 [IU] via SUBCUTANEOUS

## 2017-11-27 MED ORDER — NITROGLYCERIN 0.4 MG SL SUBL: 0.4000 mg | SUBLINGUAL_TABLET | SUBLINGUAL | Status: DC | PRN

## 2017-11-27 MED ORDER — BUDESONIDE 0.25 MG/2ML IN SUSP
0.2500 mg | Freq: Two times a day (BID) | RESPIRATORY_TRACT | Status: DC
  Administered 2017-11-27 – 2017-12-03 (×12): 0.25 mg via RESPIRATORY_TRACT
  Filled 2017-11-27 (×12): qty 2

## 2017-11-27 MED ORDER — SODIUM CHLORIDE 0.9 % IV BOLUS
250.0000 mL | Freq: Once | INTRAVENOUS | Status: AC
  Administered 2017-11-27: 250 mL via INTRAVENOUS

## 2017-11-27 MED ORDER — BACLOFEN 10 MG PO TABS
20.0000 mg | ORAL_TABLET | Freq: Three times a day (TID) | ORAL | Status: DC
  Administered 2017-11-27 – 2017-12-03 (×18): 20 mg via ORAL
  Filled 2017-11-27 (×18): qty 2

## 2017-11-27 MED ORDER — INSULIN ASPART 100 UNIT/ML ~~LOC~~ SOLN
0.0000 [IU] | Freq: Every day | SUBCUTANEOUS | Status: DC
  Administered 2017-11-27: 4 [IU] via SUBCUTANEOUS
  Administered 2017-11-28: 5 [IU] via SUBCUTANEOUS
  Administered 2017-11-29: 3 [IU] via SUBCUTANEOUS
  Administered 2017-12-01: 2 [IU] via SUBCUTANEOUS

## 2017-11-27 MED ORDER — ACETAMINOPHEN 650 MG RE SUPP: 650.0000 mg | Freq: Four times a day (QID) | RECTAL | Status: DC | PRN

## 2017-11-27 MED ORDER — MORPHINE SULFATE (PF) 2 MG/ML IV SOLN
2.0000 mg | INTRAVENOUS | Status: DC | PRN
  Administered 2017-11-27: 2 mg via INTRAVENOUS
  Filled 2017-11-27: qty 1

## 2017-11-27 MED ORDER — INSULIN GLARGINE 100 UNIT/ML ~~LOC~~ SOLN
24.0000 [IU] | Freq: Every day | SUBCUTANEOUS | Status: DC
  Administered 2017-11-27 – 2017-11-29 (×3): 24 [IU] via SUBCUTANEOUS
  Filled 2017-11-27 (×4): qty 0.24

## 2017-11-27 MED ORDER — PANTOPRAZOLE SODIUM 40 MG PO TBEC
40.0000 mg | DELAYED_RELEASE_TABLET | Freq: Every day | ORAL | Status: DC
  Administered 2017-11-28 – 2017-12-03 (×5): 40 mg via ORAL
  Filled 2017-11-27 (×6): qty 1

## 2017-11-27 MED ORDER — POTASSIUM CHLORIDE CRYS ER 20 MEQ PO TBCR
40.0000 meq | EXTENDED_RELEASE_TABLET | Freq: Once | ORAL | Status: AC
Start: 2017-11-27 — End: 2017-11-27
  Administered 2017-11-27: 40 meq via ORAL
  Filled 2017-11-27: qty 2

## 2017-11-27 MED ORDER — GUAIFENESIN 200 MG PO TABS
400.0000 mg | ORAL_TABLET | ORAL | Status: DC
  Filled 2017-11-27 (×18): qty 2

## 2017-11-27 MED ORDER — IPRATROPIUM BROMIDE 0.02 % IN SOLN
0.5000 mg | Freq: Once | RESPIRATORY_TRACT | Status: AC
  Administered 2017-11-27: 0.5 mg via RESPIRATORY_TRACT
  Filled 2017-11-27: qty 2.5

## 2017-11-27 MED ORDER — ETHAMBUTOL HCL 400 MG PO TABS
1200.0000 mg | ORAL_TABLET | Freq: Every day | ORAL | Status: DC
  Administered 2017-11-28 – 2017-12-03 (×6): 1200 mg via ORAL
  Filled 2017-11-27 (×8): qty 3

## 2017-11-27 MED ORDER — IPRATROPIUM BROMIDE 0.02 % IN SOLN
0.5000 mg | Freq: Four times a day (QID) | RESPIRATORY_TRACT | Status: DC
  Administered 2017-11-27 – 2017-12-01 (×16): 0.5 mg via RESPIRATORY_TRACT
  Filled 2017-11-27 (×16): qty 2.5

## 2017-11-27 MED ORDER — LORAZEPAM 0.5 MG PO TABS
0.5000 mg | ORAL_TABLET | Freq: Four times a day (QID) | ORAL | Status: DC | PRN
  Administered 2017-11-27: 0.5 mg via ORAL
  Filled 2017-11-27 (×2): qty 1

## 2017-11-27 MED ORDER — INSULIN ASPART 100 UNIT/ML ~~LOC~~ SOLN
0.0000 [IU] | Freq: Three times a day (TID) | SUBCUTANEOUS | Status: DC
  Administered 2017-11-28: 8 [IU] via SUBCUTANEOUS
  Administered 2017-11-28: 5 [IU] via SUBCUTANEOUS
  Administered 2017-11-28: 3 [IU] via SUBCUTANEOUS
  Administered 2017-11-29 (×2): 15 [IU] via SUBCUTANEOUS
  Administered 2017-11-29: 5 [IU] via SUBCUTANEOUS
  Administered 2017-11-30: 11 [IU] via SUBCUTANEOUS
  Administered 2017-11-30: 2 [IU] via SUBCUTANEOUS
  Administered 2017-11-30: 11 [IU] via SUBCUTANEOUS
  Administered 2017-12-01 (×2): 3 [IU] via SUBCUTANEOUS
  Administered 2017-12-01 – 2017-12-02 (×2): 8 [IU] via SUBCUTANEOUS
  Administered 2017-12-02: 5 [IU] via SUBCUTANEOUS
  Administered 2017-12-02 – 2017-12-03 (×3): 3 [IU] via SUBCUTANEOUS

## 2017-11-27 NOTE — ED Provider Notes (Signed)
[facility] EMERGENCY DEPARTMENT Provider Note   CSN: [id number] Arrival date & time: 11/27/17  1046     History   Chief Complaint Chief Complaint  Patient presents with  . Shortness of Breath    HPI Ryan [name] is a 57 y.o. male.  HPI  Pt was seen at 1120.  Per pt, c/o gradual onset and worsening of persistent cough, wheezing and SOB for the past 3 days.  Describes his symptoms as "my COPD."  Has been using home O2 N/C, MDI and nebs without relief. Has been associated with "dizziness," described as lightheadedness, and generalized weakness when he attempted to get out of bed this morning.  Denies CP/palpitations, no back pain, no abd pain, no N/V/D, no fevers, no rash, no focal motor weakness, no tingling/numbness in extremities.    Past Medical History:  Diagnosis Date  . Atrial fibrillation ([place])    Not anticoagulated  . Barrett's esophagus   . Borderline diabetes   . Bronchitis   . CAP (community acquired pneumonia) 07/10/2013   04/2015  . 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 Chronic back pain   . Chronic left shoulder pain   . Chronic neck pain   . Chronic respiratory failure ([place])   . Collagen vascular disease ([place])   . COPD (chronic obstructive pulmonary disease) ([place])   . Coronary atherosclerosis of native coronary artery    Mild nonobstructive disease at catheterization 2007  . DDD (degenerative disc disease)    Cervical and thoracic  . GERD (gastroesophageal reflux disease)   . History of pneumonia   . Hypercholesteremia   . Polycythemia   . Seizures ([place])    Last seizure 2 yrs ago  . Smoker   . Type 2 diabetes mellitus ([place])   . Type 2 diabetes mellitus without complication [facility])     Patient Active Problem List   Diagnosis Date Noted  . Acute on chronic diastolic heart failure ([place]) 10/24/2017  . COPD with acute exacerbation ([place])   . Acute respiratory failure with hypoxia ([place]) 10/17/2017  . Lower extremity  edema 10/17/2017  . Influenza B 09/21/2017  . MAI (mycobacterium avium-intracellulare) ([place]) 08/29/2015  . Edema   . Anemia of chronic disease   . Hypokalemia 08/22/2015  . Elevated liver enzymes   . Absolute anemia   . COPD (chronic obstructive pulmonary disease) ([place]) 08/17/2015  . Sepsis ([place]) 07/26/2015  . Lobar pneumonia ([place]) 07/26/2015  . Pulmonary fibrosis ([place]) 05/10/2015  . Chronic respiratory failure with hypoxia ([place]) 05/10/2015  . Chronic atrial fibrillation ([place])   . Foot pain, bilateral   . Pulmonary nodule   . Pain, generalized 05/01/2015  . Laceration 05/01/2015  . Paroxysmal atrial fibrillation ([facility])   . Chronic respiratory failure ([place])   . Hyperglycemia 09/08/2014  . A-fib ([place]) 09/08/2014  . Diabetes ([place]) 05/29/2014  . Protein-calorie malnutrition, severe ([place]) 10/21/2013  . Hemoptysis 10/20/2013  . Atrial fibrillation with rapid ventricular response ([place]) 07/10/2013  . Shoulder pain, left 01/20/2012  . Rotator cuff syndrome of left shoulder 01/20/2012  . Spondylosis, cervical 11/19/2011  . Shoulder pain 11/19/2011  . 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 Cavitary lesion of lung 05/07/2011  . GERD with stricture   . ASCVD (arteriosclerotic cardiovascular disease)   . Type 2 diabetes mellitus without complication ([place])   . TOBACCO ABUSE 05/17/2010  . Asthma 01/20/2008  . Hyperlipidemia 12/27/2007  . COPD GOLD II/ III 02  dep  quit smoking 05/01/15  12/27/2007    Past Surgical History:  Procedure Laterality Date  . COLONOSCOPY  2012   Dr. Posey Pronto: normal  . ESOPHAGOGASTRODUODENOSCOPY N/A 05/04/2015   Dr. Michail Sermon: minimal erosive esophagitis, small hiatal hernia. FOOD PRECLUDED A COMPLETE EXAM. No biopsies taken  . ESOPHAGOGASTRODUODENOSCOPY  2011   [place]: Barrett's   . LUNG BIOPSY    . Throat biopsy    . VASECTOMY    . VASECTOMY  1987        Home Medications    Prior to Admission medications   Medication Sig Start Date  End Date Taking? Authorizing Provider  albuterol (VENTOLIN HFA) 108 (90 BASE) MCG/ACT inhaler Inhale 2 puffs into the lungs every 6 (six) hours as needed for wheezing or shortness of breath.   Yes [provider]  baclofen (LIORESAL) 20 MG tablet Take 20 mg by mouth 3 (three) times daily.  06/05/15  Yes [provider]  benzonatate (TESSALON) 200 MG capsule Take 200 mg by mouth 3 (three) times daily.   Yes [provider]  budesonide (PULMICORT) 0.25 MG/2ML nebulizer solution Take 2 mLs (0.25 mg total) by nebulization 2 (two) times daily. 10/24/17  Yes [name], MD  CARTIA XT 120 MG 24 hr capsule Take 1 capsule (120 mg total) by mouth daily. 11/24/17  Yes Branch, Alphonse Guild, MD  ethambutol (MYAMBUTOL) 400 MG tablet Take 3 tablets (1,200 mg total) by mouth daily. 11/17/17  Yes Comer, Okey Regal, MD  furosemide (LASIX) 40 MG tablet Take 1 tablet (40 mg total) by mouth 2 (two) times daily. 10/24/17  Yes [name], MD  guaifenesin (HUMIBID E) 400 MG TABS tablet Take 400 mg by mouth every 4 (four) hours.   Yes [provider]  HUMALOG KWIKPEN 100 UNIT/ML KiwkPen Sliding scale 09/24/17  Yes [provider]  insulin glargine (LANTUS) 100 UNIT/ML injection Inject 0.24 mLs (24 Units total) into the skin daily. 24 units daily 10/24/17  Yes [name], MD  insulin lispro (HUMALOG) 100 UNIT/ML injection Take by sliding scale provided by hospital maximum amount daily is 60 units.  Check blood sugar 4 times a day 09/23/17  Yes [name], MD  ipratropium (ATROVENT) 0.02 % nebulizer solution VVN TID WITH LEVALBUTEROL 09/29/17  Yes [provider]  levalbuterol (XOPENEX) 0.63 MG/3ML nebulizer solution Take 3 mLs (0.63 mg total) by nebulization every 8 (eight) hours as needed for wheezing or shortness of breath. 09/05/15  Yes [name], MD  LORazepam (ATIVAN) 0.5 MG tablet Take 1 tablet (0.5 mg total) by mouth 2 (two) times daily as needed for  anxiety or sleep. Patient taking differently: Take 0.5 mg by mouth 4 (four) times daily as needed for anxiety or sleep.  05/04/15  Yes Mikhail, [place], DO  metFORMIN (GLUCOPHAGE) 500 MG tablet Take 1 tablet (500 mg total) by mouth daily with breakfast. 09/15/14  Yes Rexene Alberts, MD  montelukast (SINGULAIR) 10 MG tablet Take 10 mg by mouth at bedtime.   Yes [provider]  nitroGLYCERIN (NITROSTAT) 0.4 MG SL tablet Place 1 tablet (0.4 mg total) under the tongue every 5 (five) minutes as needed for chest pain. 04/22/13  Yes Lendon Colonel, NP  omeprazole (PRILOSEC) 20 MG capsule Take 20 mg by mouth daily.   Yes [provider]  oxyCODONE-acetaminophen (PERCOCET) 10-325 MG tablet Take 1 tablet by mouth every 4 (four) hours as needed for pain.   Yes [provider]  predniSONE (DELTASONE) 10 MG  tablet Take 2 daily for 1 week then 1 daily Patient taking differently: Take 10 mg by mouth daily with breakfast. Take 2 daily for 1 week then 1 daily 10/24/17  Yes [name], MD  rifampin (RIFADIN) 300 MG capsule Take 2 capsules (600 mg total) by mouth daily. 11/17/17  Yes Comer, Okey Regal, MD  azithromycin (ZITHROMAX) 500 MG tablet Take 1 tablet (500 mg total) by mouth daily. Patient not taking: Reported on 11/27/2017 11/17/17   Thayer Headings, MD  Fluticasone-Umeclidin-Vilant (TRELEGY ELLIPTA) 100-62.5-25 MCG/INH AEPB Inhale 1 puff into the lungs daily. Rinse mouth after you use this Patient not taking: Reported on 11/27/2017 09/23/17   [name], MD    Family History Family History  Problem Relation Age of Onset  . Hypertension Mother   . Diabetes Mother   . Heart attack Mother   . Heart failure Mother   . Hypertension Sister   . Diabetes Sister   . Heart failure Sister   . Colon cancer Neg Hx     Social History Social History   Tobacco Use  . Smoking status: Current Every Day Smoker    Packs/day: 0.50    Years: 45.00    Pack years: 22.50    Types:  Cigarettes    Last attempt to quit: 05/01/2015    Years since quitting: 2.5  . Smokeless tobacco: Never Used  Substance Use Topics  . Alcohol use: No    Alcohol/week: 0.0 oz  . Drug use: No     Allergies   Albuterol; Ciprofloxacin; and Influenza vaccine live   Review of Systems Review of Systems ROS: Statement: All systems negative except as marked or noted in the HPI; Constitutional: Negative for fever and chills. ; ; Eyes: Negative for eye pain, redness and discharge. ; ; ENMT: Negative for ear pain, hoarseness, nasal congestion, sinus pressure and sore throat. ; ; Cardiovascular: Negative for chest pain, palpitations, diaphoresis, and peripheral edema. ; ; Respiratory: +cough, wheezing, SOB. Negative for stridor. ; ; Gastrointestinal: Negative for nausea, vomiting, diarrhea, abdominal pain, blood in stool, hematemesis, jaundice and rectal bleeding. . ; ; Genitourinary: Negative for dysuria, flank pain and hematuria. ; ; Musculoskeletal: Negative for back pain and neck pain. Negative for swelling and trauma.; ; Skin: Negative for pruritus, rash, abrasions, blisters, bruising and skin lesion.; ; Neuro: +lightheadedness. Negative for headache and neck stiffness. Negative for weakness, altered level of consciousness, altered mental status, extremity weakness, paresthesias, involuntary movement, seizure and syncope.       Physical Exam Updated Vital Signs BP 125/75 (BP Location: Left Arm)   Pulse 80   Temp 98.1 F (36.7 C) (Oral)   Resp 18   Ht 5\' 8"  (1.727 m)   Wt 68 kg (150 lb)   SpO2 94%   BMI 22.81 kg/m    Patient Vitals for the past 24 hrs:  BP Temp Temp src Pulse Resp SpO2 Height Weight  11/27/17 1423 122/68 - - 87 18 98 % - -  11/27/17 1330 - - - - - 97 % - -  11/27/17 1233 125/75 - - 80 18 94 % - -  11/27/17 1206 - - - - - 98 % - -  11/27/17 1103 134/78 - - - - - - -  11/27/17 1102 - 98.1 F (36.7 C) Oral 82 (!) 24 96 % - -  11/27/17 1100 - - - - - - 5\' 8"  (1.727 m)  68 kg (150 lb)  12:34 Orthostatic Vital Signs FS  Orthostatic Lying   BP- Lying: 112/63   Pulse- Lying: 78       Orthostatic Sitting  BP- Sitting: 116/72   Pulse- Sitting: 89       Orthostatic Standing at 0 minutes  BP- Standing at 0 minutes: 107/72   Pulse- Standing at 0 minutes: 96    Physical Exam 1125: Physical examination:  Nursing notes reviewed; Vital signs and O2 SAT reviewed;  Constitutional: Well developed, Well nourished, Mildly uncomfortable appearing.;; Head:  Normocephalic, atraumatic; Eyes: EOMI, PERRL, No scleral icterus; ENMT: Mouth and pharynx normal, Mucous membranes dry; Neck: Supple, Full range of motion, No lymphadenopathy; Cardiovascular: Regular rate and rhythm, No gallop; Respiratory: Breath sounds coarse & equal bilaterally, insp/exp wheezes bilat. No audible wheezing.  Speaking short sentences, sitting upright, mildly tachypneic.; Chest: Nontender, Movement normal; Abdomen: Soft, Nontender, Nondistended, Normal bowel sounds; Genitourinary: No CVA tenderness; Extremities: Pulses normal, No tenderness, +tr pedal edema bilat. No calf edema or asymmetry.; Neuro: AA&Ox3, Major CN grossly intact.  Speech clear. No gross focal motor or sensory deficits in extremities.; Skin: Color normal, Warm, Dry.   ED Treatments / Results  Labs (all labs ordered are listed, but only abnormal results are displayed)   EKG EKG Interpretation  Date/Time:  Friday Nov 27 2017 11:02:11 EDT Ventricular Rate:  79 PR Interval:    QRS Duration: 88 QT Interval:  363 QTC Calculation: 417 R Axis:   61 Text Interpretation:  Sinus rhythm RSR' in V1 or V2, right VCD or RVH Nonspecific T abnormalities, lateral leads Baseline wander When compared with ECG of 10/17/2017 Rate slower Otherwise no significant change Confirmed by Francine Graven [phone]) on 11/27/2017 12:21:42 PM   Radiology   Procedures Procedures (including critical care time)  Medications Ordered in  ED Medications  potassium chloride SA (K-DUR,KLOR-CON) CR tablet 40 mEq (has no administration in time range)  sodium chloride 0.9 % bolus 250 mL (has no administration in time range)  levalbuterol (XOPENEX) nebulizer solution 0.63 mg (has no administration in time range)  ipratropium (ATROVENT) nebulizer solution 0.5 mg (has no administration in time range)  morphine 2 MG/ML injection 2 mg (has no administration in time range)  levalbuterol (XOPENEX) nebulizer solution 0.63 mg (0.63 mg Nebulization Given 11/27/17 1206)  ipratropium (ATROVENT) nebulizer solution 0.5 mg (0.5 mg Nebulization Given 11/27/17 1206)     Initial Impression / Assessment and Plan / ED Course  I have reviewed the triage vital signs and the nursing notes.  Pertinent labs & imaging results that were available during my care of the patient were reviewed by me and considered in my medical decision making (see chart for details).  MDM Reviewed: previous chart, nursing note and vitals Reviewed previous: labs and ECG Interpretation: labs, ECG and x-ray Total time providing critical care: 30-74 minutes. This excludes time spent performing separately reportable procedures and services. Consults: admitting MD   CRITICAL CARE Performed by: Alfonzo Feller Total critical care time: 35 minutes Critical care time was exclusive of separately billable procedures and treating other patients. Critical care was necessary to treat or prevent imminent or life-threatening deterioration. Critical care was time spent personally by me on the following activities: development of treatment plan with patient and/or surrogate as well as nursing, discussions with consultants, evaluation of patient's response to treatment, examination of patient, obtaining history from patient or surrogate, ordering and performing treatments and interventions, ordering and review of laboratory studies, ordering and review of radiographic studies, pulse  oximetry and re-evaluation of patient's condition.   Results for orders placed or performed during the hospital encounter of 93/81/82  Basic metabolic panel  Result Value Ref Range   Sodium 138 135 - 145 mmol/L   Potassium 3.1 (L) 3.5 - 5.1 mmol/L   Chloride 93 (L) 101 - 111 mmol/L   CO2 33 (H) 22 - 32 mmol/L   Glucose, Bld 221 (H) 65 - 99 mg/dL   BUN 14 6 - 20 mg/dL   Creatinine, Ser 0.93 0.61 - 1.24 mg/dL   Calcium 8.9 8.9 - 10.3 mg/dL   GFR calc non Af Amer >60 >60 mL/min   GFR calc Af Amer >60 >60 mL/min   Anion gap 12 5 - 15  Brain natriuretic peptide  Result Value Ref Range   B Natriuretic Peptide 31.0 0.0 - 100.0 pg/mL  Troponin I  Result Value Ref Range   Troponin I <0.03 <0.03 ng/mL  CBC with Differential  Result Value Ref Range   WBC 8.5 4.0 - 10.5 K/uL   RBC 4.29 4.22 - 5.81 MIL/uL   Hemoglobin 11.3 (L) 13.0 - 17.0 g/dL   HCT 35.8 (L) 39.0 - 52.0 %   MCV 83.4 78.0 - 100.0 fL   MCH 26.3 26.0 - 34.0 pg   MCHC 31.6 30.0 - 36.0 g/dL   RDW 15.0 11.5 - 15.5 %   Platelets 256 150 - 400 K/uL   Neutrophils Relative % 62 %   Neutro Abs 5.3 1.7 - 7.7 K/uL   Lymphocytes Relative 23 %   Lymphs Abs 1.9 0.7 - 4.0 K/uL   Monocytes Relative 11 %   Monocytes Absolute 0.9 0.1 - 1.0 K/uL   Eosinophils Relative 3 %   Eosinophils Absolute 0.3 0.0 - 0.7 K/uL   Basophils Relative 1 %   Basophils Absolute 0.1 0.0 - 0.1 K/uL   Dg Chest Port 1 View Result Date: 11/27/2017 CLINICAL DATA:  Cough, shortness of breath, fever. EXAM: PORTABLE CHEST 1 VIEW COMPARISON:  Radiographs of October 17, 2017. FINDINGS: The heart size and mediastinal contours are within normal limits. Stable emphysematous disease is noted in both upper lobes. Stable bibasilar opacities are noted concerning for atelectasis or scarring. Cavitary lesions noted on prior exam are not well visualized currently. No pneumothorax or significant pleural effusion is noted. The visualized skeletal structures are unremarkable.  IMPRESSION: Stable bibasilar scarring or atelectasis is noted. Stable severe emphysematous disease is noted in the upper lobes. Cavitary lesions in left lung are not well visualized on this study, and may be better evaluated with CT scan. Emphysema (ICD10-J43.9). Electronically Signed   By: Marijo Conception, M.D.   On: 11/27/2017 11:53    1245:  On arrival: pt sitting upright, tachycardic, Sats 94-96 % on his usual O2 6L N/C, lungs coarse with wheezing. IV solumedrol and xopenex/atrovent neb given (no albuterol given d/t hx palpitations). After neb: pt appears more comfortable at rest, less tachypneic, Sats 94-96 % on O2 6L N/C, lungs continue coarse with wheezing. Pt ambulated in hallway: pt's O2 Sats dropped to 86 % on usual O2 6L N/C with pt c/o increasing SOB, with increasing HR and RR. Pt escorted back to stretcher with O2 Sats slowly increasing to 98 % on O2 6L N/C. Pt orthostatic on VS; judicious IVF bolus ordered. Potassium repleted PO. 2nd short xopenex/atrovent neb ordered. Will re-ambulate after both. Pt requesting pain meds.   1425:  2nd neb tx completed. Pt continues to cough, lungs coarse. Pt  attempted to ambulate again: Sats decreased to 77% on O2 6L N/C. Dx and testing d/w pt and family.  Questions answered.  Verb understanding, agreeable to admit. T/C returned from [facility] Dr. Jerilee Hoh, case discussed, including:  HPI, pertinent PM/SHx, VS/PE, dx testing, ED course and treatment:  Agreeable to admit.       Final Clinical Impressions(s) / ED Diagnoses   Final diagnoses:  None    ED Discharge Orders    None       Francine Graven, DO 11/29/17 2134

## 2017-11-27 NOTE — Patient Outreach (Addendum)
[facility]) Care Management  11/27/2017  Pierre Dellarocco Fisch 1961-07-05 [id number]   Care coordination/Transition of care week   [facility] CM received an ADT Notice of ED arrival for Mr Mangione for sob x 3 days Mr Bodey has not returned a call from [facility] after a voice message was left for him on 11/24/17 and on 11/19/17  11/27/17 1709 Cm noted Mr Bellis was admitted to [facility] for COPD Exacerbation today Cm cancelled scheduled appointment and closed last started Transition of care case in Epic. He was last admitted and d/c from 10/18/17 to 10/24/17    Plans [facility] CM will monitor Mr Houdek for possible ED discharged today and contact him later in the day He was scheduled for a telephone outreach call today for his third call per [facility] Call attempt workflow If Mr Ginsberg is admitted, a warm transfer will be completed to Gwendalyn Ege to restart transition of care services after discharge.  11/27/17 1709 This Cm and F McCray discussed this patient as restarting Transition of care services for Mr Cen once the CM is notified of his discharge from [facility].   Kimberly L. Lavina Hamman, RN, BSN, [place] Coordinator [phone] week day mobile

## 2017-11-27 NOTE — ED Notes (Signed)
Resp called for breathing treatment.

## 2017-11-27 NOTE — ED Notes (Signed)
Pt ambulated. Initial o2 was 96%. Pt desat to 86%. Pt c/o dizziness and sob toward end of ambulating.

## 2017-11-27 NOTE — ED Triage Notes (Signed)
Increased sob x past 3 days

## 2017-11-27 NOTE — ED Notes (Signed)
Pt ambulated after 2nd breathing tx. Initial o2 96%. Pt rapid desat to low 80%. Lowest 77% on 6L o2 via [place]. Pt c/o cp, sob, dizziness.

## 2017-11-27 NOTE — H&P (Signed)
History and Physical    [name] BWI:[id number] DOB: March 15, 1961 DOA: 11/27/2017  Referring MD/NP/PA: Francine Graven, EDP PCP: [name], MD  Patient coming from: Home  Chief Complaint: Shortness of breath  HPI: Ryan [name] is a 57 y.o. male with a history of COPD and pulmonary MAI infection who comes into the hospital today with a gradual worsening of cough, wheezing and shortness of breath over the past 3 days.  He recognizes this is typical for his COPD flare.  He is chronically on 6 L of oxygen and in the ED desaturated to the upper 70s on his 6 L while ambulating.  He denies chest pain, palpitations, abdominal pain, nausea, vomiting, diarrhea.  Denies fevers or chills.  Admission is requested for further evaluation and management.  Past Medical/Surgical History: Past Medical History:  Diagnosis Date  . Atrial fibrillation ([place])    Not anticoagulated  . Barrett's esophagus   . Borderline diabetes   . Bronchitis   . CAP (community acquired pneumonia) 07/10/2013   04/2015  . 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 Chronic back pain   . Chronic left shoulder pain   . Chronic neck pain   . Chronic respiratory failure ([place])   . Collagen vascular disease ([place])   . COPD (chronic obstructive pulmonary disease) ([place])   . Coronary atherosclerosis of native coronary artery    Mild nonobstructive disease at catheterization 2007  . DDD (degenerative disc disease)    Cervical and thoracic  . GERD (gastroesophageal reflux disease)   . History of pneumonia   . Hypercholesteremia   . Polycythemia   . Seizures ([place])    Last seizure 2 yrs ago  . Smoker   . Type 2 diabetes mellitus ([place])   . Type 2 diabetes mellitus without complication [facility])     Past Surgical History:  Procedure Laterality Date  . COLONOSCOPY  2012   Dr. Posey Pronto: normal  . ESOPHAGOGASTRODUODENOSCOPY N/A 05/04/2015   Dr. Michail Sermon: minimal erosive esophagitis, small  hiatal hernia. FOOD PRECLUDED A COMPLETE EXAM. No biopsies taken  . ESOPHAGOGASTRODUODENOSCOPY  2011   [place]: Barrett's   . LUNG BIOPSY    . Throat biopsy    . VASECTOMY    . VASECTOMY  1987    Social History:  reports that he has been smoking cigarettes.  He has a 22.50 pack-year smoking history. He has never used smokeless tobacco. He reports that he does not drink alcohol or use drugs.  Allergies: Allergies  Allergen Reactions  . Albuterol Palpitations  . 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 Influenza Vaccine Live Swelling    Family History:  Family History  Problem Relation Age of Onset  . Hypertension Mother   . Diabetes Mother   . Heart attack Mother   . Heart failure Mother   . Hypertension Sister   . Diabetes Sister   . Heart failure Sister   . Colon cancer Neg Hx     Prior to Admission medications   Medication Sig Start Date End Date Taking? Authorizing Provider  albuterol (VENTOLIN HFA) 108 (90 BASE) MCG/ACT inhaler Inhale 2 puffs into the lungs every 6 (six) hours as needed for wheezing or shortness of breath.   Yes [provider]  baclofen (LIORESAL) 20 MG tablet Take 20 mg by mouth 3 (three) times daily.  06/05/15  Yes [provider]  benzonatate (TESSALON) 200 MG capsule Take 200 mg by mouth 3 (three) times daily.   Yes [provider]  budesonide (PULMICORT) 0.25 MG/2ML nebulizer solution Take 2 mLs (0.25 mg total) by nebulization 2 (two) times daily. 10/24/17  Yes [name], MD  CARTIA XT 120 MG 24 hr capsule Take 1 capsule (120 mg total) by mouth daily. 11/24/17  Yes Branch, Alphonse Guild, MD  ethambutol (MYAMBUTOL) 400 MG tablet Take 3 tablets (1,200 mg total) by mouth daily. 11/17/17  Yes Comer, Okey Regal, MD  furosemide (LASIX) 40 MG tablet Take 1 tablet (40 mg total) by mouth 2 (two) times daily. 10/24/17  Yes [name], MD  guaifenesin (HUMIBID E)  400 MG TABS tablet Take 400 mg by mouth every 4 (four) hours.   Yes [provider]  HUMALOG KWIKPEN 100 UNIT/ML KiwkPen Sliding scale 09/24/17  Yes [provider]  insulin glargine (LANTUS) 100 UNIT/ML injection Inject 0.24 mLs (24 Units total) into the skin daily. 24 units daily 10/24/17  Yes [name], MD  insulin lispro (HUMALOG) 100 UNIT/ML injection Take by sliding scale provided by hospital maximum amount daily is 60 units.  Check blood sugar 4 times a day 09/23/17  Yes [name], MD  ipratropium (ATROVENT) 0.02 % nebulizer solution VVN TID WITH LEVALBUTEROL 09/29/17  Yes [provider]  levalbuterol (XOPENEX) 0.63 MG/3ML nebulizer solution Take 3 mLs (0.63 mg total) by nebulization every 8 (eight) hours as needed for wheezing or shortness of breath. 09/05/15  Yes [name], MD  LORazepam (ATIVAN) 0.5 MG tablet Take 1 tablet (0.5 mg total) by mouth 2 (two) times daily as needed for anxiety or sleep. Patient taking differently: Take 0.5 mg by mouth 4 (four) times daily as needed for anxiety or sleep.  05/04/15  Yes Mikhail, [place], DO  metFORMIN (GLUCOPHAGE) 500 MG tablet Take 1 tablet (500 mg total) by mouth daily with breakfast. 09/15/14  Yes Rexene Alberts, MD  montelukast (SINGULAIR) 10 MG tablet Take 10 mg by mouth at bedtime.   Yes [provider]  nitroGLYCERIN (NITROSTAT) 0.4 MG SL tablet Place 1 tablet (0.4 mg total) under the tongue every 5 (five) minutes as needed for chest pain. 04/22/13  Yes Lendon Colonel, NP  omeprazole (PRILOSEC) 20 MG capsule Take 20 mg by mouth daily.   Yes [provider]  oxyCODONE-acetaminophen (PERCOCET) 10-325 MG tablet Take 1 tablet by mouth every 4 (four) hours as needed for pain.   Yes [provider]  predniSONE (DELTASONE) 10 MG tablet Take 2 daily for 1 week then 1 daily Patient taking differently: Take 10 mg by mouth daily with breakfast. Take 2 daily for 1 week then 1 daily  10/24/17  Yes [name], MD  rifampin (RIFADIN) 300 MG capsule Take 2 capsules (600 mg total) by mouth daily. 11/17/17  Yes Comer, Okey Regal, MD  azithromycin (ZITHROMAX) 500 MG tablet Take 1 tablet (500 mg total) by mouth daily. Patient not taking: Reported on 11/27/2017 11/17/17   Thayer Headings, MD  Fluticasone-Umeclidin-Vilant (TRELEGY ELLIPTA) 100-62.5-25 MCG/INH AEPB Inhale 1 puff into the lungs daily. Rinse mouth after you use this Patient not taking: Reported on 11/27/2017 09/23/17   [name], MD    Review of Systems:  Constitutional: Denies fever, chills, diaphoresis, appetite change and fatigue.  HEENT: Denies photophobia, eye pain, redness, hearing loss, ear pain, congestion, sore throat, rhinorrhea, sneezing, mouth sores, trouble swallowing, neck pain, neck stiffness and tinnitus.   Respiratory: Positive for  SOB, DOE, cough, and wheezing.  Denies chest tightness Cardiovascular: Denies chest pain, palpitations and leg swelling.  Gastrointestinal: Denies nausea, vomiting, abdominal pain, diarrhea, constipation, blood in stool and abdominal distention.  Genitourinary: Denies dysuria, urgency, frequency, hematuria, flank pain and difficulty urinating.  Endocrine: Denies: hot or cold intolerance, sweats, changes in hair or nails, polyuria, polydipsia. Musculoskeletal: Denies myalgias, back pain, joint swelling, arthralgias and gait problem.  Skin: Denies pallor, rash and wound.  Neurological: Denies dizziness, seizures, syncope, weakness, light-headedness, numbness and headaches.  Hematological: Denies adenopathy. Easy bruising, personal or family bleeding history  Psychiatric/Behavioral: Denies suicidal ideation, mood changes, confusion, nervousness, sleep disturbance and agitation    Physical Exam: Vitals:   11/27/17 1530 11/27/17 1600 11/27/17 1617 11/27/17 1630  BP: 116/69 109/66 109/66 (!) 107/59  Pulse: 93 87 92 90  Resp: (!) 22 (!) 23 (!) 22 (!) 21  Temp:        TempSrc:      SpO2: 95% 96% 98% 96%  Weight:      Height:         Constitutional: NAD, calm, comfortable, thin Eyes: PERRL, lids and conjunctivae normal ENMT: Mucous membranes are dry. Posterior pharynx clear of any exudate or lesions.  Neck: normal, supple, no masses, no thyromegaly Respiratory: Diffuse expiratory wheezes, no accessory muscle use, normal respiratory effort.  Cardiovascular: Regular rate and rhythm, no murmurs / rubs / gallops. No extremity edema. 2+ pedal pulses. No carotid bruits.  Abdomen: no tenderness, no masses palpated. No hepatosplenomegaly. Bowel sounds positive.  Musculoskeletal: no clubbing / cyanosis. No joint deformity upper and lower extremities. Good ROM, no contractures. Normal muscle tone.  Skin: no rashes, lesions, ulcers. No induration Neurologic: CN 2-12 grossly intact. Sensation intact, DTR normal. Strength 5/5 in all 4.  Psychiatric: Normal judgment and insight. Alert and oriented x 3. Normal mood.    Labs on Admission: I have personally reviewed the following labs and imaging studies  CBC: Recent Labs  Lab 11/27/17 1136  WBC 8.5  NEUTROABS 5.3  HGB 11.3*  HCT 35.8*  MCV 83.4  PLT 387   Basic Metabolic Panel: Recent Labs  Lab 11/27/17 1136  NA 138  K 3.1*  CL 93*  CO2 33*  GLUCOSE 221*  BUN 14  CREATININE 0.93  CALCIUM 8.9  MG 1.8   GFR: Estimated Creatinine Clearance: 83.3 mL/min (by C-G formula based on SCr of 0.93 mg/dL). Liver Function Tests: No results for input(s): AST, ALT, ALKPHOS, BILITOT, PROT, ALBUMIN in the last 168 hours. No results for input(s): LIPASE, AMYLASE in the last 168 hours. No results for input(s): AMMONIA in the last 168 hours. Coagulation Profile: No results for input(s): INR, PROTIME in the last 168 hours. Cardiac Enzymes: Recent Labs  Lab 11/27/17 1136  TROPONINI <0.03   BNP (last 3 results) No results for input(s): PROBNP in the last 8760 hours. HbA1C: No results for input(s): HGBA1C  in the last 72 hours. CBG: Recent Labs  Lab 11/27/17 1518  GLUCAP 216*   Lipid Profile: No results for input(s): CHOL, HDL, LDLCALC, TRIG, CHOLHDL, LDLDIRECT in the last 72 hours. Thyroid Function Tests: No results for input(s): TSH, T4TOTAL, FREET4, T3FREE, THYROIDAB in the last 72 hours. Anemia Panel: No results for input(s): VITAMINB12, FOLATE, FERRITIN, TIBC, IRON, RETICCTPCT in the last 72 hours. Urine analysis:    Component Value Date/Time   COLORURINE YELLOW 10/17/2017 2205   APPEARANCEUR CLEAR 10/17/2017 2205   LABSPEC 1.010 10/17/2017 2205   PHURINE 5.0 10/17/2017  [place] 10/17/2017 2205   HGBUR SMALL (A) 10/17/2017 [place] 10/17/2017 2205   [place] 10/17/2017 2205   PROTEINUR NEGATIVE 10/17/2017 2205   UROBILINOGEN 0.2 09/08/2014 1444   NITRITE NEGATIVE 10/17/2017 2205   LEUKOCYTESUR NEGATIVE 10/17/2017 2205   Sepsis Labs: _0 (procalcitonin:4,lacticidven:4) )No results found for this or any previous visit (from the past 240 hour(s)).   Radiological Exams on Admission: Dg Chest Port 1 View  Result Date: 11/27/2017 CLINICAL DATA:  Cough, shortness of breath, fever. EXAM: PORTABLE CHEST 1 VIEW COMPARISON:  Radiographs of October 17, 2017. FINDINGS: The heart size and mediastinal contours are within normal limits. Stable emphysematous disease is noted in both upper lobes. Stable bibasilar opacities are noted concerning for atelectasis or scarring. Cavitary lesions noted on prior exam are not well visualized currently. No pneumothorax or significant pleural effusion is noted. The visualized skeletal structures are unremarkable. IMPRESSION: Stable bibasilar scarring or atelectasis is noted. Stable severe emphysematous disease is noted in the upper lobes. Cavitary lesions in left lung are not well visualized on this study, and may be better evaluated with CT scan. Emphysema (ICD10-J43.9). Electronically Signed   By: Marijo Conception, M.D.   On: 11/27/2017 11:53    EKG: Independently reviewed.  Normal sinus rhythm, nonspecific T wave changes  Assessment/Plan Principal Problem:   COPD with acute exacerbation (HCC) Active Problems:   Hyperlipidemia   Type 2 diabetes mellitus without complication (HCC)   Protein-calorie malnutrition, severe (HCC)   Paroxysmal atrial fibrillation (HCC)   Chronic respiratory failure (HCC)   Chronic respiratory failure with hypoxia (HCC)   MAI (mycobacterium avium-intracellulare) (HCC)    Acute on chronic hypoxemic respiratory failure -Due to COPD with acute exacerbation and pulmonary MAC infection. -Continue oxygen as needed.  Is on 6 L chronically at home. -Start Solu-Medrol, nebs for his COPD. -For his pulmonary MAC we will continue ethambutol, rifampin and azithromycin (this has been discussed with Dr. Johnnye Sima with infectious diseases).  He is followed by Dr. Linus Salmons with infectious diseases.  Insulin-dependent diabetes -Check hemoglobin A1c. -Continue home dose of Lantus and start sliding scale while hospitalized.  Severe protein caloric malnutrition -We will request dietitian evaluation.  Dr. Luan Pulling to assume care in a.m.   DVT prophylaxis: Lovenox Code Status: Full code Family Communication: Patient only Disposition Plan: Discharge home once medically stable Consults called: Curbside consultation with ID, Dr. Johnnye Sima in regard to his MAI treatment. Admission status: Admit - It is my clinical opinion that admission to INPATIENT is reasonable and necessary because of the expectation that this patient will require hospital care that crosses at least 2 midnights to treat this condition based on the medical complexity of the problems presented.  Given the aforementioned information, the predictability of an adverse outcome is felt to be significant.      Time Spent: 85 minutes  Estela Isaac Bliss MD [facility] Pager [phone]  If 7PM-7AM,  please contact night-coverage [email or web address] Password [email or web address]  11/27/2017, 4:59 PM

## 2017-11-28 LAB — CBC
HCT: 33.7 % — ABNORMAL LOW (ref 39.0–52.0)
Hemoglobin: 10.6 g/dL — ABNORMAL LOW (ref 13.0–17.0)
MCH: 26.6 pg (ref 26.0–34.0)
MCHC: 31.5 g/dL (ref 30.0–36.0)
MCV: 84.5 fL (ref 78.0–100.0)
PLATELETS: 244 10*3/uL (ref 150–400)
RBC: 3.99 MIL/uL — ABNORMAL LOW (ref 4.22–5.81)
RDW: 14.8 % (ref 11.5–15.5)
WBC: 6.9 10*3/uL (ref 4.0–10.5)

## 2017-11-28 LAB — GLUCOSE, CAPILLARY
GLUCOSE-CAPILLARY: 354 mg/dL — AB (ref 65–99)
Glucose-Capillary: 270 mg/dL — ABNORMAL HIGH (ref 65–99)
Glucose-Capillary: 153 mg/dL — ABNORMAL HIGH (ref 65–99)
Glucose-Capillary: 249 mg/dL — ABNORMAL HIGH (ref 65–99)

## 2017-11-28 LAB — BASIC METABOLIC PANEL
Anion gap: 8 (ref 5–15)
BUN: 13 mg/dL (ref 6–20)
CHLORIDE: 98 mmol/L — AB (ref 101–111)
CO2: 31 mmol/L (ref 22–32)
CREATININE: 0.88 mg/dL (ref 0.61–1.24)
Calcium: 8.9 mg/dL (ref 8.9–10.3)
GFR calc Af Amer: >60 mL/min (ref >60)
GFR calc non Af Amer: >60 mL/min (ref >60)
GLUCOSE: 325 mg/dL — AB (ref 65–99)
Potassium: 4.3 mmol/L (ref 3.5–5.1)
Sodium: 137 mmol/L (ref 135–145)

## 2017-11-28 LAB — HEMOGLOBIN A1C
Hgb A1c MFr Bld: 8.7 % — ABNORMAL HIGH (ref 4.8–5.6)
Mean Plasma Glucose: 202.99 mg/dL

## 2017-11-28 MED ORDER — LORAZEPAM 0.5 MG PO TABS
0.5000 mg | ORAL_TABLET | Freq: Two times a day (BID) | ORAL | Status: DC
  Administered 2017-11-28 – 2017-12-03 (×10): 0.5 mg via ORAL
  Filled 2017-11-28 (×10): qty 1

## 2017-11-28 MED ORDER — GUAIFENESIN 100 MG/5ML PO SOLN
20.0000 mL | ORAL | Status: DC
  Administered 2017-11-28 – 2017-12-03 (×31): 400 mg via ORAL
  Filled 2017-11-28: qty 15
  Filled 2017-11-28 (×5): qty 20
  Filled 2017-11-28: qty 5
  Filled 2017-11-28 (×3): qty 20
  Filled 2017-11-28: qty 5
  Filled 2017-11-28 (×3): qty 20
  Filled 2017-11-28: qty 15
  Filled 2017-11-28 (×6): qty 20
  Filled 2017-11-28: qty 5
  Filled 2017-11-28: qty 20
  Filled 2017-11-28: qty 15
  Filled 2017-11-28 (×5): qty 20
  Filled 2017-11-28: qty 5
  Filled 2017-11-28 (×2): qty 20
  Filled 2017-11-28: qty 5

## 2017-11-28 MED ORDER — MORPHINE SULFATE (PF) 2 MG/ML IV SOLN
2.0000 mg | INTRAVENOUS | Status: DC | PRN
  Administered 2017-11-28 – 2017-12-03 (×23): 2 mg via INTRAVENOUS
  Filled 2017-11-28 (×24): qty 1

## 2017-11-28 MED ORDER — ORAL CARE MOUTH RINSE
15.0000 mL | Freq: Two times a day (BID) | OROMUCOSAL | Status: DC
  Administered 2017-11-29 – 2017-12-03 (×4): 15 mL via OROMUCOSAL

## 2017-11-28 MED ORDER — GUAIFENESIN 100 MG/5ML PO SOLN: 20.0000 mL | ORAL | Status: DC | PRN

## 2017-11-28 MED ORDER — OXYCODONE-ACETAMINOPHEN 5-325 MG PO TABS
2.0000 | ORAL_TABLET | ORAL | Status: DC | PRN
  Administered 2017-11-28 – 2017-11-29 (×4): 2 via ORAL
  Filled 2017-11-28 (×4): qty 2

## 2017-11-28 NOTE — Progress Notes (Signed)
Subjective: He was admitted yesterday with COPD exacerbation.  He is had trouble with keeping his oxygenation adequate at home.  He is been weak.  He is coughing up some discolored sputum.  He is not having any other new complaints specifically no chest pain nausea vomiting diarrhea.  Objective: Vital signs in last 24 hours: Temp:  [97.9 F (36.6 C)-98.7 F (37.1 C)] 98.7 F (37.1 C) (05/18 0935) Pulse Rate:  [76-93] 83 (05/18 0935) Resp:  [16-24] 16 (05/18 0935) BP: (103-134)/(59-79) 116/74 (05/18 0935) SpO2:  [94 %-99 %] 98 % (05/18 0935) Weight:  [66.9 kg (147 lb 7.8 oz)-68 kg (150 lb)] 66.9 kg (147 lb 7.8 oz) (05/17 1732) Weight change:  Last BM Date: 11/26/17  Intake/Output from previous day: 05/17 0701 - 05/18 0700 In: 1631.4 [P.O.:360; I.V.:713.8; IV Piggyback:557.6] Out: 700 [Urine:700]  PHYSICAL EXAM General appearance: alert, cooperative and no distress Resp: rhonchi bilaterally Cardio: irregularly irregular rhythm GI: soft, non-tender; bowel sounds normal; no masses,  no organomegaly Extremities: extremities normal, atraumatic, no cyanosis or edema Skin warm and dry.  Poor vision  Lab Results:  Results for orders placed or performed during the hospital encounter of 11/27/17 (from the past 48 hour(s))  Basic metabolic panel     Status: Abnormal   Collection Time: 11/27/17 11:36 AM  Result Value Ref Range   Sodium 138 135 - 145 mmol/L   Potassium 3.1 (L) 3.5 - 5.1 mmol/L   Chloride 93 (L) 101 - 111 mmol/L   CO2 33 (H) 22 - 32 mmol/L   Glucose, Bld 221 (H) 65 - 99 mg/dL   BUN 14 6 - 20 mg/dL   Creatinine, Ser 0.93 0.61 - 1.24 mg/dL   Calcium 8.9 8.9 - 10.3 mg/dL   GFR calc non Af Amer >60 >60 mL/min   GFR calc Af Amer >60 >60 mL/min    Comment: (NOTE) The eGFR has been calculated using the CKD EPI equation. This calculation has not been validated in all clinical situations. eGFR's persistently <60 mL/min signify possible Chronic Kidney Disease.    Anion gap  12 5 - 15    Comment: Performed at [facility], [address], [place], [place] [address]  Brain natriuretic peptide     Status: None   Collection Time: 11/27/17 11:36 AM  Result Value Ref Range   B Natriuretic Peptide 31.0 0.0 - 100.0 pg/mL    Comment: Performed at [facility], [address], [place], [place] [address]  Troponin I     Status: None   Collection Time: 11/27/17 11:36 AM  Result Value Ref Range   Troponin I <0.03 <0.03 ng/mL    Comment: Performed at [facility], [address], [place], [place] [address]  CBC with Differential     Status: Abnormal   Collection Time: 11/27/17 11:36 AM  Result Value Ref Range   WBC 8.5 4.0 - 10.5 K/uL   RBC 4.29 4.22 - 5.81 MIL/uL   Hemoglobin 11.3 (L) 13.0 - 17.0 g/dL   HCT 35.8 (L) 39.0 - 52.0 %   MCV 83.4 78.0 - 100.0 fL   MCH 26.3 26.0 - 34.0 pg   MCHC 31.6 30.0 - 36.0 g/dL   RDW 15.0 11.5 - 15.5 %   Platelets 256 150 - 400 K/uL   Neutrophils Relative % 62 %   Neutro Abs 5.3 1.7 - 7.7 K/uL   Lymphocytes Relative 23 %   Lymphs Abs 1.9 0.7 - 4.0 K/uL   Monocytes Relative 11 %  Monocytes Absolute 0.9 0.1 - 1.0 K/uL   Eosinophils Relative 3 %   Eosinophils Absolute 0.3 0.0 - 0.7 K/uL   Basophils Relative 1 %   Basophils Absolute 0.1 0.0 - 0.1 K/uL    Comment: Performed at [facility], [address], [place], [place] [address]  Magnesium     Status: None   Collection Time: 11/27/17 11:36 AM  Result Value Ref Range   Magnesium 1.8 1.7 - 2.4 mg/dL    Comment: Performed at [facility], [address], [place], [place] [address]  CBG monitoring, ED     Status: Abnormal   Collection Time: 11/27/17  3:18 PM  Result Value Ref Range   Glucose-Capillary 216 (H) 65 - 99 mg/dL  Glucose, capillary     Status: Abnormal   Collection Time: 11/27/17  5:41 PM  Result Value Ref Range   Glucose-Capillary 335 (H) 65 - 99 mg/dL   Comment 1 Notify RN    Comment 2 Document in Chart   Glucose, capillary     Status: Abnormal   Collection  Time: 11/27/17  9:24 PM  Result Value Ref Range   Glucose-Capillary 329 (H) 65 - 99 mg/dL   Comment 1 Notify RN    Comment 2 Document in Chart   Basic metabolic panel     Status: Abnormal   Collection Time: 11/28/17  5:53 AM  Result Value Ref Range   Sodium 137 135 - 145 mmol/L   Potassium 4.3 3.5 - 5.1 mmol/L    Comment: DELTA CHECK NOTED   Chloride 98 (L) 101 - 111 mmol/L   CO2 31 22 - 32 mmol/L   Glucose, Bld 325 (H) 65 - 99 mg/dL   BUN 13 6 - 20 mg/dL   Creatinine, Ser 0.88 0.61 - 1.24 mg/dL   Calcium 8.9 8.9 - 10.3 mg/dL   GFR calc non Af Amer >60 >60 mL/min   GFR calc Af Amer >60 >60 mL/min    Comment: (NOTE) The eGFR has been calculated using the CKD EPI equation. This calculation has not been validated in all clinical situations. eGFR's persistently <60 mL/min signify possible Chronic Kidney Disease.    Anion gap 8 5 - 15    Comment: Performed at [facility], [address], [place], [place] [address]  CBC     Status: Abnormal   Collection Time: 11/28/17  5:53 AM  Result Value Ref Range   WBC 6.9 4.0 - 10.5 K/uL   RBC 3.99 (L) 4.22 - 5.81 MIL/uL   Hemoglobin 10.6 (L) 13.0 - 17.0 g/dL   HCT 33.7 (L) 39.0 - 52.0 %   MCV 84.5 78.0 - 100.0 fL   MCH 26.6 26.0 - 34.0 pg   MCHC 31.5 30.0 - 36.0 g/dL   RDW 14.8 11.5 - 15.5 %   Platelets 244 150 - 400 K/uL    Comment: Performed at [facility], [address], [place],  [address]  Glucose, capillary     Status: Abnormal   Collection Time: 11/28/17  7:53 AM  Result Value Ref Range   Glucose-Capillary 270 (H) 65 - 99 mg/dL    ABGS No results for input(s): PHART, PO2ART, TCO2, HCO3 in the last 72 hours.  Invalid input(s): PCO2 CULTURES No results found for this or any previous visit (from the past 240 hour(s)). Studies/Results: Dg Chest Port 1 View  Result Date: 11/27/2017 CLINICAL DATA:  Cough, shortness of breath, fever. EXAM: PORTABLE CHEST 1 VIEW COMPARISON:  Radiographs of  October 17, 2017. FINDINGS: The  heart size and mediastinal contours are within normal limits. Stable emphysematous disease is noted in both upper lobes. Stable bibasilar opacities are noted concerning for atelectasis or scarring. Cavitary lesions noted on prior exam are not well visualized currently. No pneumothorax or significant pleural effusion is noted. The visualized skeletal structures are unremarkable. IMPRESSION: Stable bibasilar scarring or atelectasis is noted. Stable severe emphysematous disease is noted in the upper lobes. Cavitary lesions in left lung are not well visualized on this study, and may be better evaluated with CT scan. Emphysema (ICD10-J43.9). Electronically Signed   By: Marijo Conception, M.D.   On: 11/27/2017 11:53    Medications:  Prior to Admission:  Medications Prior to Admission  Medication Sig Dispense Refill Last Dose  . albuterol (VENTOLIN HFA) 108 (90 BASE) MCG/ACT inhaler Inhale 2 puffs into the lungs every 6 (six) hours as needed for wheezing or shortness of breath.   unknown  . baclofen (LIORESAL) 20 MG tablet Take 20 mg by mouth 3 (three) times daily.    11/26/2017 at Unknown time  . benzonatate (TESSALON) 200 MG capsule Take 200 mg by mouth 3 (three) times daily.   11/26/2017 at Unknown time  . budesonide (PULMICORT) 0.25 MG/2ML nebulizer solution Take 2 mLs (0.25 mg total) by nebulization 2 (two) times daily. 60 mL 12 11/26/2017 at Unknown time  . CARTIA XT 120 MG 24 hr capsule Take 1 capsule (120 mg total) by mouth daily. 30 capsule 0 11/26/2017 at Unknown time  . ethambutol (MYAMBUTOL) 400 MG tablet Take 3 tablets (1,200 mg total) by mouth daily. 90 tablet 11 11/26/2017 at Unknown time  . furosemide (LASIX) 40 MG tablet Take 1 tablet (40 mg total) by mouth 2 (two) times daily. 60 tablet 12 11/26/2017 at Unknown time  . guaifenesin (HUMIBID E) 400 MG TABS tablet Take 400 mg by mouth every 4 (four) hours.   11/26/2017 at Unknown time  . HUMALOG KWIKPEN 100 UNIT/ML KiwkPen Sliding scale  0 11/26/2017 at  Unknown time  . insulin glargine (LANTUS) 100 UNIT/ML injection Inject 0.24 mLs (24 Units total) into the skin daily. 24 units daily 10 mL 11 11/22/2017 at Unknown time  . insulin lispro (HUMALOG) 100 UNIT/ML injection Take by sliding scale provided by hospital maximum amount daily is 60 units.  Check blood sugar 4 times a day 10 mL 11 unknown  . ipratropium (ATROVENT) 0.02 % nebulizer solution VVN TID WITH LEVALBUTEROL  5 11/26/2017 at Unknown time  . levalbuterol (XOPENEX) 0.63 MG/3ML nebulizer solution Take 3 mLs (0.63 mg total) by nebulization every 8 (eight) hours as needed for wheezing or shortness of breath. 3 mL 12 11/26/2017 at Unknown time  . LORazepam (ATIVAN) 0.5 MG tablet Take 1 tablet (0.5 mg total) by mouth 2 (two) times daily as needed for anxiety or sleep. (Patient taking differently: Take 0.5 mg by mouth 4 (four) times daily as needed for anxiety or sleep. ) 20 tablet 0 11/26/2017 at Unknown time  . metFORMIN (GLUCOPHAGE) 500 MG tablet Take 1 tablet (500 mg total) by mouth daily with breakfast. 30 tablet 3 11/26/2017 at Unknown time  . montelukast (SINGULAIR) 10 MG tablet Take 10 mg by mouth at bedtime.   11/26/2017 at Unknown time  . nitroGLYCERIN (NITROSTAT) 0.4 MG SL tablet Place 1 tablet (0.4 mg total) under the tongue every 5 (five) minutes as needed for chest pain. 25 tablet 4 unknown  . omeprazole (PRILOSEC) 20 MG capsule Take 20  mg by mouth daily.   11/26/2017 at Unknown time  . oxyCODONE-acetaminophen (PERCOCET) 10-325 MG tablet Take 1 tablet by mouth every 4 (four) hours as needed for pain.   11/26/2017 at Unknown time  . predniSONE (DELTASONE) 10 MG tablet Take 2 daily for 1 week then 1 daily (Patient taking differently: Take 10 mg by mouth daily with breakfast. Take 2 daily for 1 week then 1 daily) 60 tablet 5 11/26/2017 at Unknown time  . rifampin (RIFADIN) 300 MG capsule Take 2 capsules (600 mg total) by mouth daily. 60 capsule 11 11/26/2017 at Unknown time  . azithromycin  (ZITHROMAX) 500 MG tablet Take 1 tablet (500 mg total) by mouth daily. (Patient not taking: Reported on 11/27/2017) 30 tablet 11 Not Taking at Unknown time  . Fluticasone-Umeclidin-Vilant (TRELEGY ELLIPTA) 100-62.5-25 MCG/INH AEPB Inhale 1 puff into the lungs daily. Rinse mouth after you use this (Patient not taking: Reported on 11/27/2017) 1 each 12 Not Taking at Unknown time   Scheduled: . baclofen  20 mg Oral TID  . benzonatate  200 mg Oral TID  . budesonide  0.25 mg Nebulization BID  . diltiazem  120 mg Oral Daily  . enoxaparin (LOVENOX) injection  40 mg Subcutaneous Q24H  . ethambutol  1,200 mg Oral Daily  . guaiFENesin  20 mL Oral Q4H  . insulin aspart  0-15 Units Subcutaneous TID WC  . insulin aspart  0-5 Units Subcutaneous QHS  . insulin aspart  4 Units Subcutaneous TID WC  . insulin glargine  24 Units Subcutaneous Daily  . ipratropium  0.5 mg Nebulization Q6H  . levalbuterol  1.25 mg Nebulization TID  . LORazepam  0.5 mg Oral BID  . methylPREDNISolone (SOLU-MEDROL) injection  60 mg Intravenous Q6H  . montelukast  10 mg Oral QHS  . pantoprazole  40 mg Oral Daily  . rifampin  600 mg Oral Daily   Continuous: . sodium chloride 75 mL/hr at 11/28/17 5883   GPQ:DIYMEBRAXENMM **OR** acetaminophen, morphine injection, nitroGLYCERIN, ondansetron **OR** ondansetron (ZOFRAN) IV, oxyCODONE-acetaminophen, senna-docusate  Assesment: He has COPD with exacerbation.  He has paroxysmal atrial fib and he has not been anticoagulated because of rifampin.  He has chronic hypoxic respiratory failure which is stable.  He has diabetes which is doing okay.  He has diastolic heart failure which is well controlled now.  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

## 2017-11-28 NOTE — Progress Notes (Signed)
Nutrition Brief Note  RD consulted due to PMHx of Severe Protein Calorie malnutrition  Wt Readings from Last 15 Encounters:  11/27/17 147 lb 7.8 oz (66.9 kg)  11/17/17 155 lb 1.9 oz (70.4 kg)  10/30/17 143 lb 12.8 oz (65.2 kg)  10/24/17 149 lb 6.4 oz (67.8 kg)  10/04/17 145 lb 4.5 oz (65.9 kg)  09/15/17 140 lb (63.5 kg)  07/03/17 144 lb (65.3 kg)  06/09/17 144 lb (65.3 kg)  12/26/16 140 lb (63.5 kg)  09/24/16 147 lb (66.7 kg)  08/29/15 144 lb (65.3 kg)  08/22/15 161 lb 2.5 oz (73.1 kg)  08/01/15 140 lb 3.2 oz (63.6 kg)  06/30/15 144 lb (65.3 kg)  05/11/15 151 lb 9.6 oz (68.8 kg)   Body mass index is 22.43 kg/m. Patient meets criteria for Healthy wt for ht based on current BMI.   Per review of chart, this dx was placed during a remote hospital admission in 2015, over four years ago. He was weighing 135-140 lbs at that time and certainly may have met severe malnutrition criteria.   However, over the past couple years, the patient appears to have maintained, or potentially even gained weight. Per this encounters H&P/Pulm notes, the patients sole complaints have been cough, wheezing, SOB. He has not had any n/v/c/d or other symptoms that could limit PO intake. Infact, he has eaten 100% of all meals since his admission.   Current diet order is heart healty/carb mod. Again, patient is consuming approximately 100% of meals at this time.   No nutrition interventions warranted at this time. If nutrition issues arise, please consult RD.   Burtis Junes RD, LDN, CNSC Clinical Nutrition Available Tues-Sat via Pager: [phone] 11/28/2017 11:53 AM

## 2017-11-29 LAB — GLUCOSE, CAPILLARY
GLUCOSE-CAPILLARY: 362 mg/dL — AB (ref 65–99)
GLUCOSE-CAPILLARY: 269 mg/dL — AB (ref 65–99)
Glucose-Capillary: 223 mg/dL — ABNORMAL HIGH (ref 65–99)
Glucose-Capillary: 362 mg/dL — ABNORMAL HIGH (ref 65–99)

## 2017-11-29 MED ORDER — OXYCODONE-ACETAMINOPHEN 5-325 MG PO TABS
2.0000 | ORAL_TABLET | ORAL | Status: DC
  Administered 2017-11-29 – 2017-12-03 (×24): 2 via ORAL
  Filled 2017-11-29 (×25): qty 2

## 2017-11-29 MED ORDER — SALINE SPRAY 0.65 % NA SOLN
1.0000 | NASAL | Status: DC | PRN
  Administered 2017-11-29: 1 via NASAL
  Filled 2017-11-29: qty 44

## 2017-11-29 NOTE — Progress Notes (Signed)
Subjective: He feels better.  He is still coughing and coughing up some sputum.  He still has pain and wants his pain medicine to be scheduled.  He was able to walk around in the room a little bit today.  Objective: Vital signs in last 24 hours: Temp:  [98 F (36.7 C)-98.5 F (36.9 C)] 98 F (36.7 C) (05/19 0534) Pulse Rate:  [70-83] 70 (05/19 0534) Resp:  [16-20] 20 (05/19 0534) BP: (116-137)/(67-74) 128/67 (05/19 0534) SpO2:  [95 %-98 %] 98 % (05/19 0733) Weight change:  Last BM Date: 11/28/17  Intake/Output from previous day: 05/18 0701 - 05/19 0700 In: 2780 [P.O.:980; I.V.:1800] Out: 1500 [Urine:1500]  PHYSICAL EXAM General appearance: alert, cooperative and no distress Resp: rhonchi bilaterally Cardio: regular rate and rhythm, S1, S2 normal, no murmur, click, rub or gallop GI: soft, non-tender; bowel sounds normal; no masses,  no organomegaly Extremities: extremities normal, atraumatic, no cyanosis or edema  Lab Results:  Results for orders placed or performed during the hospital encounter of 11/27/17 (from the past 48 hour(s))  Basic metabolic panel     Status: Abnormal   Collection Time: 11/27/17 11:36 AM  Result Value Ref Range   Sodium 138 135 - 145 mmol/L   Potassium 3.1 (L) 3.5 - 5.1 mmol/L   Chloride 93 (L) 101 - 111 mmol/L   CO2 33 (H) 22 - 32 mmol/L   Glucose, Bld 221 (H) 65 - 99 mg/dL   BUN 14 6 - 20 mg/dL   Creatinine, Ser 0.93 0.61 - 1.24 mg/dL   Calcium 8.9 8.9 - 10.3 mg/dL   GFR calc non Af Amer >60 >60 mL/min   GFR calc Af Amer >60 >60 mL/min    Comment: (NOTE) The eGFR has been calculated using the CKD EPI equation. This calculation has not been validated in all clinical situations. eGFR's persistently <60 mL/min signify possible Chronic Kidney Disease.    Anion gap 12 5 - 15    Comment: Performed at [facility] [address], [place], [place] [address]  Brain natriuretic peptide     Status: None   Collection Time: 11/27/17 11:36 AM  Result  Value Ref Range   B Natriuretic Peptide 31.0 0.0 - 100.0 pg/mL    Comment: Performed at [facility], [address], [place], [place] [address]  Troponin I     Status: None   Collection Time: 11/27/17 11:36 AM  Result Value Ref Range   Troponin I <0.03 <0.03 ng/mL    Comment: Performed at [facility], [address] [place], [place] [address]  CBC with Differential     Status: Abnormal   Collection Time: 11/27/17 11:36 AM  Result Value Ref Range   WBC 8.5 4.0 - 10.5 K/uL   RBC 4.29 4.22 - 5.81 MIL/uL   Hemoglobin 11.3 (L) 13.0 - 17.0 g/dL   HCT 35.8 (L) 39.0 - 52.0 %   MCV 83.4 78.0 - 100.0 fL   MCH 26.3 26.0 - 34.0 pg   MCHC 31.6 30.0 - 36.0 g/dL   RDW 15.0 11.5 - 15.5 %   Platelets 256 150 - 400 K/uL   Neutrophils Relative % 62 %   Neutro Abs 5.3 1.7 - 7.7 K/uL   Lymphocytes Relative 23 %   Lymphs Abs 1.9 0.7 - 4.0 K/uL   Monocytes Relative 11 %   Monocytes Absolute 0.9 0.1 - 1.0 K/uL   Eosinophils Relative 3 %   Eosinophils Absolute 0.3 0.0 - 0.7 K/uL  Basophils Relative 1 %   Basophils Absolute 0.1 0.0 - 0.1 K/uL    Comment: Performed at [facility], [address], [place], [place] [address]  Magnesium     Status: None   Collection Time: 11/27/17 11:36 AM  Result Value Ref Range   Magnesium 1.8 1.7 - 2.4 mg/dL    Comment: Performed at [facility], [address] [place], [place] [address]  CBG monitoring, ED     Status: Abnormal   Collection Time: 11/27/17  3:18 PM  Result Value Ref Range   Glucose-Capillary 216 (H) 65 - 99 mg/dL  Glucose, capillary     Status: Abnormal   Collection Time: 11/27/17  5:41 PM  Result Value Ref Range   Glucose-Capillary 335 (H) 65 - 99 mg/dL   Comment 1 Notify RN    Comment 2 Document in Chart   Glucose, capillary     Status: Abnormal   Collection Time: 11/27/17  9:24 PM  Result Value Ref Range   Glucose-Capillary 329 (H) 65 - 99 mg/dL   Comment 1 Notify RN    Comment 2 Document in Chart   Basic metabolic panel     Status:  Abnormal   Collection Time: 11/28/17  5:53 AM  Result Value Ref Range   Sodium 137 135 - 145 mmol/L   Potassium 4.3 3.5 - 5.1 mmol/L    Comment: DELTA CHECK NOTED   Chloride 98 (L) 101 - 111 mmol/L   CO2 31 22 - 32 mmol/L   Glucose, Bld 325 (H) 65 - 99 mg/dL   BUN 13 6 - 20 mg/dL   Creatinine, Ser 0.88 0.61 - 1.24 mg/dL   Calcium 8.9 8.9 - 10.3 mg/dL   GFR calc non Af Amer >60 >60 mL/min   GFR calc Af Amer >60 >60 mL/min    Comment: (NOTE) The eGFR has been calculated using the CKD EPI equation. This calculation has not been validated in all clinical situations. eGFR's persistently <60 mL/min signify possible Chronic Kidney Disease.    Anion gap 8 5 - 15    Comment: Performed at [facility], [address], [place], [place] [address]  CBC     Status: Abnormal   Collection Time: 11/28/17  5:53 AM  Result Value Ref Range   WBC 6.9 4.0 - 10.5 K/uL   RBC 3.99 (L) 4.22 - 5.81 MIL/uL   Hemoglobin 10.6 (L) 13.0 - 17.0 g/dL   HCT 33.7 (L) 39.0 - 52.0 %   MCV 84.5 78.0 - 100.0 fL   MCH 26.6 26.0 - 34.0 pg   MCHC 31.5 30.0 - 36.0 g/dL   RDW 14.8 11.5 - 15.5 %   Platelets 244 150 - 400 K/uL    Comment: Performed at [facility], [address], [place], [place] [address]  Hemoglobin A1c     Status: Abnormal   Collection Time: 11/28/17  5:53 AM  Result Value Ref Range   Hgb A1c MFr Bld 8.7 (H) 4.8 - 5.6 %    Comment: (NOTE) Pre diabetes:          5.7%-6.4% Diabetes:              >6.4% Glycemic control for   <7.0% adults with diabetes    Mean Plasma Glucose 202.99 mg/dL    Comment: Performed at [facility], [place] [address] [place], [place] [address]  Glucose, capillary     Status: Abnormal   Collection Time: 11/28/17  7:53 AM  Result Value Ref Range   Glucose-Capillary 270 (H) 65 - 99 mg/dL  Glucose, capillary     Status: Abnormal   Collection Time: 11/28/17 11:56 AM  Result Value Ref Range   Glucose-Capillary 153 (H) 65 - 99 mg/dL  Glucose, capillary     Status:  Abnormal   Collection Time: 11/28/17  4:29 PM  Result Value Ref Range   Glucose-Capillary 249 (H) 65 - 99 mg/dL   Comment 1 Notify RN    Comment 2 Document in Chart   Glucose, capillary     Status: Abnormal   Collection Time: 11/28/17  8:49 PM  Result Value Ref Range   Glucose-Capillary 354 (H) 65 - 99 mg/dL   Comment 1 Notify RN    Comment 2 Document in Chart   Glucose, capillary     Status: Abnormal   Collection Time: 11/29/17  7:25 AM  Result Value Ref Range   Glucose-Capillary 362 (H) 65 - 99 mg/dL    ABGS No results for input(s): PHART, PO2ART, TCO2, HCO3 in the last 72 hours.  Invalid input(s): PCO2 CULTURES No results found for this or any previous visit (from the past 240 hour(s)). Studies/Results: Dg Chest Port 1 View  Result Date: 11/27/2017 CLINICAL DATA:  Cough, shortness of breath, fever. EXAM: PORTABLE CHEST 1 VIEW COMPARISON:  Radiographs of October 17, 2017. FINDINGS: The heart size and mediastinal contours are within normal limits. Stable emphysematous disease is noted in both upper lobes. Stable bibasilar opacities are noted concerning for atelectasis or scarring. Cavitary lesions noted on prior exam are not well visualized currently. No pneumothorax or significant pleural effusion is noted. The visualized skeletal structures are unremarkable. IMPRESSION: Stable bibasilar scarring or atelectasis is noted. Stable severe emphysematous disease is noted in the upper lobes. Cavitary lesions in left lung are not well visualized on this study, and may be better evaluated with CT scan. Emphysema (ICD10-J43.9). Electronically Signed   By: Marijo Conception, M.D.   On: 11/27/2017 11:53    Medications:  Prior to Admission:  Medications Prior to Admission  Medication Sig Dispense Refill Last Dose  . albuterol (VENTOLIN HFA) 108 (90 BASE) MCG/ACT inhaler Inhale 2 puffs into the lungs every 6 (six) hours as needed for wheezing or shortness of breath.   unknown  . baclofen (LIORESAL)  20 MG tablet Take 20 mg by mouth 3 (three) times daily.    11/26/2017 at Unknown time  . benzonatate (TESSALON) 200 MG capsule Take 200 mg by mouth 3 (three) times daily.   11/26/2017 at Unknown time  . budesonide (PULMICORT) 0.25 MG/2ML nebulizer solution Take 2 mLs (0.25 mg total) by nebulization 2 (two) times daily. 60 mL 12 11/26/2017 at Unknown time  . CARTIA XT 120 MG 24 hr capsule Take 1 capsule (120 mg total) by mouth daily. 30 capsule 0 11/26/2017 at Unknown time  . ethambutol (MYAMBUTOL) 400 MG tablet Take 3 tablets (1,200 mg total) by mouth daily. 90 tablet 11 11/26/2017 at Unknown time  . furosemide (LASIX) 40 MG tablet Take 1 tablet (40 mg total) by mouth 2 (two) times daily. 60 tablet 12 11/26/2017 at Unknown time  . guaifenesin (HUMIBID E) 400 MG TABS tablet Take 400 mg by mouth every 4 (four) hours.   11/26/2017 at Unknown time  . HUMALOG KWIKPEN 100 UNIT/ML KiwkPen Sliding scale  0 11/26/2017 at Unknown time  . insulin glargine (LANTUS) 100 UNIT/ML injection Inject 0.24 mLs (24 Units total) into the skin daily. 24 units  daily 10 mL 11 11/22/2017 at Unknown time  . insulin lispro (HUMALOG) 100 UNIT/ML injection Take by sliding scale provided by hospital maximum amount daily is 60 units.  Check blood sugar 4 times a day 10 mL 11 unknown  . ipratropium (ATROVENT) 0.02 % nebulizer solution VVN TID WITH LEVALBUTEROL  5 11/26/2017 at Unknown time  . levalbuterol (XOPENEX) 0.63 MG/3ML nebulizer solution Take 3 mLs (0.63 mg total) by nebulization every 8 (eight) hours as needed for wheezing or shortness of breath. 3 mL 12 11/26/2017 at Unknown time  . LORazepam (ATIVAN) 0.5 MG tablet Take 1 tablet (0.5 mg total) by mouth 2 (two) times daily as needed for anxiety or sleep. (Patient taking differently: Take 0.5 mg by mouth 4 (four) times daily as needed for anxiety or sleep. ) 20 tablet 0 11/26/2017 at Unknown time  . metFORMIN (GLUCOPHAGE) 500 MG tablet Take 1 tablet (500 mg total) by mouth daily with  breakfast. 30 tablet 3 11/26/2017 at Unknown time  . montelukast (SINGULAIR) 10 MG tablet Take 10 mg by mouth at bedtime.   11/26/2017 at Unknown time  . nitroGLYCERIN (NITROSTAT) 0.4 MG SL tablet Place 1 tablet (0.4 mg total) under the tongue every 5 (five) minutes as needed for chest pain. 25 tablet 4 unknown  . omeprazole (PRILOSEC) 20 MG capsule Take 20 mg by mouth daily.   11/26/2017 at Unknown time  . oxyCODONE-acetaminophen (PERCOCET) 10-325 MG tablet Take 1 tablet by mouth every 4 (four) hours as needed for pain.   11/26/2017 at Unknown time  . predniSONE (DELTASONE) 10 MG tablet Take 2 daily for 1 week then 1 daily (Patient taking differently: Take 10 mg by mouth daily with breakfast. Take 2 daily for 1 week then 1 daily) 60 tablet 5 11/26/2017 at Unknown time  . rifampin (RIFADIN) 300 MG capsule Take 2 capsules (600 mg total) by mouth daily. 60 capsule 11 11/26/2017 at Unknown time  . azithromycin (ZITHROMAX) 500 MG tablet Take 1 tablet (500 mg total) by mouth daily. (Patient not taking: Reported on 11/27/2017) 30 tablet 11 Not Taking at Unknown time  . Fluticasone-Umeclidin-Vilant (TRELEGY ELLIPTA) 100-62.5-25 MCG/INH AEPB Inhale 1 puff into the lungs daily. Rinse mouth after you use this (Patient not taking: Reported on 11/27/2017) 1 each 12 Not Taking at Unknown time   Scheduled: . baclofen  20 mg Oral TID  . benzonatate  200 mg Oral TID  . budesonide  0.25 mg Nebulization BID  . diltiazem  120 mg Oral Daily  . enoxaparin (LOVENOX) injection  40 mg Subcutaneous Q24H  . ethambutol  1,200 mg Oral Daily  . guaiFENesin  20 mL Oral Q4H  . insulin aspart  0-15 Units Subcutaneous TID WC  . insulin aspart  0-5 Units Subcutaneous QHS  . insulin aspart  4 Units Subcutaneous TID WC  . insulin glargine  24 Units Subcutaneous Daily  . ipratropium  0.5 mg Nebulization Q6H  . levalbuterol  1.25 mg Nebulization TID  . LORazepam  0.5 mg Oral BID  . mouth rinse  15 mL Mouth Rinse BID  .  methylPREDNISolone (SOLU-MEDROL) injection  60 mg Intravenous Q6H  . montelukast  10 mg Oral QHS  . oxyCODONE-acetaminophen  2 tablet Oral Q4H  . pantoprazole  40 mg Oral Daily  . rifampin  600 mg Oral Daily   Continuous:  JSH:[id number] **OR** acetaminophen, morphine injection, nitroGLYCERIN, ondansetron **OR** ondansetron (ZOFRAN) IV, senna-docusate  Assesment: He was admitted with COPD exacerbation.  He is doing  better now.  He has acute on chronic hypoxic respiratory failure still using oxygen.  He has had trouble with congestive heart failure in the past so I am going to discontinue his IV fluids now.  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  He has diabetes and he is on insulin Principal Problem:   COPD with acute exacerbation ([place]) Active Problems:   Hyperlipidemia   Type 2 diabetes mellitus without complication (HCC)   Protein-calorie malnutrition, severe (HCC)   Paroxysmal atrial fibrillation (HCC)   Chronic respiratory failure (HCC)   Chronic respiratory failure with hypoxia (HCC)   MAI (mycobacterium avium-intracellulare) ([place])    Plan: Continue current treatments.    LOS: 2 days   Faron Whitelock L 11/29/2017, 9:48 AM

## 2017-11-30 LAB — GLUCOSE, CAPILLARY
Glucose-Capillary: 321 mg/dL — ABNORMAL HIGH (ref 65–99)
Glucose-Capillary: 304 mg/dL — ABNORMAL HIGH (ref 65–99)
Glucose-Capillary: 135 mg/dL — ABNORMAL HIGH (ref 65–99)
Glucose-Capillary: 195 mg/dL — ABNORMAL HIGH (ref 65–99)

## 2017-11-30 MED ORDER — FUROSEMIDE 40 MG PO TABS
40.0000 mg | ORAL_TABLET | Freq: Two times a day (BID) | ORAL | Status: DC
  Administered 2017-11-30 – 2017-12-02 (×5): 40 mg via ORAL
  Filled 2017-11-30 (×5): qty 1

## 2017-11-30 MED ORDER — POTASSIUM CHLORIDE CRYS ER 20 MEQ PO TBCR: 20.0000 meq | EXTENDED_RELEASE_TABLET | Freq: Two times a day (BID) | ORAL | Status: DC

## 2017-11-30 MED ORDER — PREDNISONE 20 MG PO TABS
40.0000 mg | ORAL_TABLET | Freq: Every day | ORAL | Status: DC
  Administered 2017-11-30 – 2017-12-03 (×4): 40 mg via ORAL
  Filled 2017-11-30 (×4): qty 2

## 2017-11-30 MED ORDER — AZITHROMYCIN 250 MG PO TABS
500.0000 mg | ORAL_TABLET | Freq: Every day | ORAL | Status: DC
  Administered 2017-11-30 – 2017-12-03 (×4): 500 mg via ORAL
  Filled 2017-11-30 (×4): qty 2

## 2017-11-30 MED ORDER — POTASSIUM CHLORIDE CRYS ER 20 MEQ PO TBCR
20.0000 meq | EXTENDED_RELEASE_TABLET | Freq: Every day | ORAL | Status: DC
  Administered 2017-11-30 – 2017-12-01 (×2): 20 meq via ORAL
  Filled 2017-11-30 (×2): qty 1

## 2017-11-30 MED ORDER — INSULIN GLARGINE 100 UNIT/ML ~~LOC~~ SOLN
30.0000 [IU] | Freq: Every day | SUBCUTANEOUS | Status: DC
  Administered 2017-11-30 – 2017-12-03 (×4): 30 [IU] via SUBCUTANEOUS
  Filled 2017-11-30 (×5): qty 0.3

## 2017-11-30 NOTE — Progress Notes (Signed)
Subjective: He says he feels better.  He has no new complaints except his feet are swelling.  He had been on Lasix at home but was taken off here because he seems dehydrated on admission.  I discontinued his IV fluids yesterday.  He is not getting azithromycin and I am not sure what happened with that.  Objective: Vital signs in last 24 hours: Temp:  [97.7 F (36.5 C)-98.2 F (36.8 C)] 97.8 F (36.6 C) (05/20 0533) Pulse Rate:  [63-89] 63 (05/20 0533) Resp:  [20-22] 20 (05/20 0533) BP: (128-138)/(75-87) 135/75 (05/20 0533) SpO2:  [94 %-97 %] 95 % (05/20 0755) Weight change:  Last BM Date: 11/29/17  Intake/Output from previous day: 05/19 0701 - 05/20 0700 In: 240 [P.O.:240] Out: 2750 [Urine:2750]  PHYSICAL EXAM General appearance: alert, cooperative and no distress Resp: clear to auscultation bilaterally Cardio: regular rate and rhythm, S1, S2 normal, no murmur, click, rub or gallop GI: soft, non-tender; bowel sounds normal; no masses,  no organomegaly Extremities: 1+ pretibial edema bilaterally  Lab Results:  Results for orders placed or performed during the hospital encounter of 11/27/17 (from the past 48 hour(s))  Glucose, capillary     Status: Abnormal   Collection Time: 11/28/17 11:56 AM  Result Value Ref Range   Glucose-Capillary 153 (H) 65 - 99 mg/dL  Glucose, capillary     Status: Abnormal   Collection Time: 11/28/17  4:29 PM  Result Value Ref Range   Glucose-Capillary 249 (H) 65 - 99 mg/dL   Comment 1 Notify RN    Comment 2 Document in Chart   Glucose, capillary     Status: Abnormal   Collection Time: 11/28/17  8:49 PM  Result Value Ref Range   Glucose-Capillary 354 (H) 65 - 99 mg/dL   Comment 1 Notify RN    Comment 2 Document in Chart   Glucose, capillary     Status: Abnormal   Collection Time: 11/29/17  7:25 AM  Result Value Ref Range   Glucose-Capillary 362 (H) 65 - 99 mg/dL  Glucose, capillary     Status: Abnormal   Collection Time: 11/29/17 11:22 AM   Result Value Ref Range   Glucose-Capillary 223 (H) 65 - 99 mg/dL  Glucose, capillary     Status: Abnormal   Collection Time: 11/29/17  4:40 PM  Result Value Ref Range   Glucose-Capillary 362 (H) 65 - 99 mg/dL   Comment 1 Notify RN    Comment 2 Document in Chart   Glucose, capillary     Status: Abnormal   Collection Time: 11/29/17  9:19 PM  Result Value Ref Range   Glucose-Capillary 269 (H) 65 - 99 mg/dL   Comment 1 Notify RN    Comment 2 Document in Chart   Glucose, capillary     Status: Abnormal   Collection Time: 11/30/17  7:41 AM  Result Value Ref Range   Glucose-Capillary 321 (H) 65 - 99 mg/dL   Comment 1 Notify RN     ABGS No results for input(s): PHART, PO2ART, TCO2, HCO3 in the last 72 hours.  Invalid input(s): PCO2 CULTURES No results found for this or any previous visit (from the past 240 hour(s)). Studies/Results: No results found.  Medications:  Prior to Admission:  Medications Prior to Admission  Medication Sig Dispense Refill Last Dose  . albuterol (VENTOLIN HFA) 108 (90 BASE) MCG/ACT inhaler Inhale 2 puffs into the lungs every 6 (six) hours as needed for wheezing or shortness of breath.   unknown  .  baclofen (LIORESAL) 20 MG tablet Take 20 mg by mouth 3 (three) times daily.    11/26/2017 at Unknown time  . benzonatate (TESSALON) 200 MG capsule Take 200 mg by mouth 3 (three) times daily.   11/26/2017 at Unknown time  . budesonide (PULMICORT) 0.25 MG/2ML nebulizer solution Take 2 mLs (0.25 mg total) by nebulization 2 (two) times daily. 60 mL 12 11/26/2017 at Unknown time  . CARTIA XT 120 MG 24 hr capsule Take 1 capsule (120 mg total) by mouth daily. 30 capsule 0 11/26/2017 at Unknown time  . ethambutol (MYAMBUTOL) 400 MG tablet Take 3 tablets (1,200 mg total) by mouth daily. 90 tablet 11 11/26/2017 at Unknown time  . furosemide (LASIX) 40 MG tablet Take 1 tablet (40 mg total) by mouth 2 (two) times daily. 60 tablet 12 11/26/2017 at Unknown time  . guaifenesin (HUMIBID  E) 400 MG TABS tablet Take 400 mg by mouth every 4 (four) hours.   11/26/2017 at Unknown time  . HUMALOG KWIKPEN 100 UNIT/ML KiwkPen Sliding scale  0 11/26/2017 at Unknown time  . insulin glargine (LANTUS) 100 UNIT/ML injection Inject 0.24 mLs (24 Units total) into the skin daily. 24 units daily 10 mL 11 11/22/2017 at Unknown time  . insulin lispro (HUMALOG) 100 UNIT/ML injection Take by sliding scale provided by hospital maximum amount daily is 60 units.  Check blood sugar 4 times a day 10 mL 11 unknown  . ipratropium (ATROVENT) 0.02 % nebulizer solution VVN TID WITH LEVALBUTEROL  5 11/26/2017 at Unknown time  . levalbuterol (XOPENEX) 0.63 MG/3ML nebulizer solution Take 3 mLs (0.63 mg total) by nebulization every 8 (eight) hours as needed for wheezing or shortness of breath. 3 mL 12 11/26/2017 at Unknown time  . LORazepam (ATIVAN) 0.5 MG tablet Take 1 tablet (0.5 mg total) by mouth 2 (two) times daily as needed for anxiety or sleep. (Patient taking differently: Take 0.5 mg by mouth 4 (four) times daily as needed for anxiety or sleep. ) 20 tablet 0 11/26/2017 at Unknown time  . metFORMIN (GLUCOPHAGE) 500 MG tablet Take 1 tablet (500 mg total) by mouth daily with breakfast. 30 tablet 3 11/26/2017 at Unknown time  . montelukast (SINGULAIR) 10 MG tablet Take 10 mg by mouth at bedtime.   11/26/2017 at Unknown time  . nitroGLYCERIN (NITROSTAT) 0.4 MG SL tablet Place 1 tablet (0.4 mg total) under the tongue every 5 (five) minutes as needed for chest pain. 25 tablet 4 unknown  . omeprazole (PRILOSEC) 20 MG capsule Take 20 mg by mouth daily.   11/26/2017 at Unknown time  . oxyCODONE-acetaminophen (PERCOCET) 10-325 MG tablet Take 1 tablet by mouth every 4 (four) hours as needed for pain.   11/26/2017 at Unknown time  . predniSONE (DELTASONE) 10 MG tablet Take 2 daily for 1 week then 1 daily (Patient taking differently: Take 10 mg by mouth daily with breakfast. Take 2 daily for 1 week then 1 daily) 60 tablet 5 11/26/2017 at  Unknown time  . rifampin (RIFADIN) 300 MG capsule Take 2 capsules (600 mg total) by mouth daily. 60 capsule 11 11/26/2017 at Unknown time  . azithromycin (ZITHROMAX) 500 MG tablet Take 1 tablet (500 mg total) by mouth daily. (Patient not taking: Reported on 11/27/2017) 30 tablet 11 Not Taking at Unknown time  . Fluticasone-Umeclidin-Vilant (TRELEGY ELLIPTA) 100-62.5-25 MCG/INH AEPB Inhale 1 puff into the lungs daily. Rinse mouth after you use this (Patient not taking: Reported on 11/27/2017) 1 each 12 Not Taking at Unknown  time   Scheduled: . azithromycin  500 mg Oral Daily  . baclofen  20 mg Oral TID  . benzonatate  200 mg Oral TID  . budesonide  0.25 mg Nebulization BID  . diltiazem  120 mg Oral Daily  . enoxaparin (LOVENOX) injection  40 mg Subcutaneous Q24H  . ethambutol  1,200 mg Oral Daily  . furosemide  40 mg Oral BID  . guaiFENesin  20 mL Oral Q4H  . insulin aspart  0-15 Units Subcutaneous TID WC  . insulin aspart  0-5 Units Subcutaneous QHS  . insulin aspart  4 Units Subcutaneous TID WC  . insulin glargine  24 Units Subcutaneous Daily  . ipratropium  0.5 mg Nebulization Q6H  . levalbuterol  1.25 mg Nebulization TID  . LORazepam  0.5 mg Oral BID  . mouth rinse  15 mL Mouth Rinse BID  . montelukast  10 mg Oral QHS  . oxyCODONE-acetaminophen  2 tablet Oral Q4H  . pantoprazole  40 mg Oral Daily  . potassium chloride  20 mEq Oral Daily  . predniSONE  40 mg Oral Q breakfast  . rifampin  600 mg Oral Daily   Continuous:  YWV:[id number] **OR** acetaminophen, morphine injection, nitroGLYCERIN, ondansetron **OR** ondansetron (ZOFRAN) IV, senna-docusate, sodium chloride  Assesment: He was admitted with COPD exacerbation.  He has acute on chronic hypoxic respiratory failure.  He is much better his chest is clear and I think it is okay for him to switch to oral prednisone now  He has diabetes and I will increase his Lantus.  He has Mycobacterium avium infection and I have adjusted  his antibiotics  He has acute on chronic diastolic heart failure and will restart Lasix Principal Problem:   COPD with acute exacerbation (HCC) Active Problems:   Hyperlipidemia   Type 2 diabetes mellitus without complication (HCC)   Protein-calorie malnutrition, severe (HCC)   Paroxysmal atrial fibrillation (HCC)   Chronic respiratory failure (HCC)   Chronic respiratory failure with hypoxia (HCC)   MAI (mycobacterium avium-intracellulare) ([place])    Plan: Restart Lasix.  Discontinue IV steroids and switch to prednisone.  Restart azithromycin increase Lantus    LOS: 3 days   Amirr Achord L 11/30/2017, 8:35 AM

## 2017-11-30 NOTE — Progress Notes (Signed)
Inpatient Diabetes Program Recommendations  AACE/ADA: New Consensus Statement on Inpatient Glycemic Control (2019)  Target Ranges:  Prepandial:   less than 140 mg/dL      Peak postprandial:   less than 180 mg/dL (1-2 hours)      Critically ill patients:  140 - 180 mg/dL   Results for Ryan [name], Ryan [name] (MRN [id number]) as of 11/30/2017 08:19  Ref. Range 11/29/2017 07:25 11/29/2017 11:22 11/29/2017 16:40 11/29/2017 21:19 11/30/2017 07:41  Glucose-Capillary Latest Ref Range: 65 - 99 mg/dL 362 (H) 223 (H) 362 (H) 269 (H) 321 (H)   Review of Glycemic Control  Diabetes history: DM2 Outpatient Diabetes medications: Lantus 24 units daily, Humalog QID, Metformin 500 mg daily Current orders for Inpatient glycemic control: Lantus 24 units daily, Novolog 0-15 units TID with meals, Novolog 0-5 units QHS, Novolog 4 units TID with meals; Solumedrol 60 mg Q6H  Inpatient Diabetes Program Recommendations: Insulin - Basal: Please consider increasing Lantus to 30 units daily. Insulin - Meal Coverage: If steroids are continued as ordered, please consider increasing meal coverage to Novolog 10 units TID with meals.  Thanks, Barnie Alderman, RN, MSN, CDE Diabetes Coordinator Inpatient Diabetes Program [phone] (Team Pager from 8am to 5pm)

## 2017-11-30 NOTE — Care Management Important Message (Signed)
Important Message  Patient Details  Name: Ryan [name] MRN: [id number] Date of Birth: 1960/12/02   Medicare Important Message Given:  Yes    [name] 11/30/2017, 11:37 AM

## 2017-11-30 NOTE — Care Management Note (Signed)
Case Management Note  Patient Details  Name: Ryan [name] MRN: [id number] Date of Birth: [birth year]  Subjective/Objective:            COPD exacerbation. Pt active with [facility] for nursing services. He is active with [facility]. He has home oxygen and neb machine. This is his 4th admission in 6 months.         Action/Plan: DC home with resumption of [place] services. Juliann Pulse, [facility] rep, aware of admission. Pt will need order to resume services. CM will cont to follow.   Expected Discharge Date:                  Expected Discharge Plan:  [place]  In-House Referral:  NA  Discharge planning Services  CM Consult  Post Acute Care Choice:  Resumption of Svcs/PTA Provider, Home Health Choice offered to:  Patient  HH Arranged:  RN [facility] Agency:  [place]  Status of Service:  In process, will continue to follow   Sherald Barge, RN 11/30/2017, 1:52 PM

## 2017-12-01 DIAGNOSIS — K429 Umbilical hernia without obstruction or gangrene: Secondary | ICD-10-CM | POA: Diagnosis present

## 2017-12-01 LAB — GLUCOSE, CAPILLARY
GLUCOSE-CAPILLARY: 161 mg/dL — AB (ref 65–99)
GLUCOSE-CAPILLARY: 186 mg/dL — AB (ref 65–99)
Glucose-Capillary: 280 mg/dL — ABNORMAL HIGH (ref 65–99)
Glucose-Capillary: 205 mg/dL — ABNORMAL HIGH (ref 65–99)

## 2017-12-01 LAB — BASIC METABOLIC PANEL
ANION GAP: 10 (ref 5–15)
BUN: 22 mg/dL — ABNORMAL HIGH (ref 6–20)
CO2: 33 mmol/L — ABNORMAL HIGH (ref 22–32)
Calcium: 8.9 mg/dL (ref 8.9–10.3)
Chloride: 93 mmol/L — ABNORMAL LOW (ref 101–111)
Creatinine, Ser: 1.2 mg/dL (ref 0.61–1.24)
GFR calc Af Amer: >60 mL/min (ref >60)
Glucose, Bld: 183 mg/dL — ABNORMAL HIGH (ref 65–99)
POTASSIUM: 3.5 mmol/L (ref 3.5–5.1)
Sodium: 136 mmol/L (ref 135–145)

## 2017-12-01 LAB — MAGNESIUM: MAGNESIUM: 2.1 mg/dL (ref 1.7–2.4)

## 2017-12-01 LAB — MRSA PCR SCREENING: MRSA by PCR: NEGATIVE

## 2017-12-01 MED ORDER — PRAVASTATIN SODIUM 40 MG PO TABS
40.0000 mg | ORAL_TABLET | Freq: Every day | ORAL | Status: DC
  Administered 2017-12-01: 40 mg via ORAL
  Filled 2017-12-01: qty 1

## 2017-12-01 MED ORDER — POTASSIUM CHLORIDE CRYS ER 20 MEQ PO TBCR
40.0000 meq | EXTENDED_RELEASE_TABLET | Freq: Two times a day (BID) | ORAL | Status: DC
  Administered 2017-12-01 – 2017-12-03 (×4): 40 meq via ORAL
  Filled 2017-12-01 (×4): qty 2

## 2017-12-01 MED ORDER — IPRATROPIUM BROMIDE 0.02 % IN SOLN
0.5000 mg | Freq: Three times a day (TID) | RESPIRATORY_TRACT | Status: DC
  Administered 2017-12-01 – 2017-12-03 (×6): 0.5 mg via RESPIRATORY_TRACT
  Filled 2017-12-01 (×6): qty 2.5

## 2017-12-01 NOTE — Progress Notes (Signed)
Subjective: He says he feels better.  He still has some swelling in his legs.  He has no other new complaints except he has an umbilical hernia.  He is still coughing some.  He has some muscle cramps.  Objective: Vital signs in last 24 hours: Temp:  [97.4 F (36.3 C)-98.7 F (37.1 C)] 97.4 F (36.3 C) (05/21 0507) Pulse Rate:  [80-91] 84 (05/21 0507) Resp:  [16-21] 16 (05/21 0507) BP: (95-131)/(67-90) 95/67 (05/21 0507) SpO2:  [96 %-98 %] 97 % (05/21 0507) Weight change:  Last BM Date: 11/30/17  Intake/Output from previous day: 05/20 0701 - 05/21 0700 In: 840 [P.O.:840] Out: 2775 [Urine:2775]  PHYSICAL EXAM General appearance: alert, cooperative and no distress Resp: Some rhonchi Cardio: regular rate and rhythm, S1, S2 normal, no murmur, click, rub or gallop GI: soft, non-tender; bowel sounds normal; no masses,  no organomegaly Extremities: He has some edema above his socks  Lab Results:  Results for orders placed or performed during the hospital encounter of 11/27/17 (from the past 48 hour(s))  Glucose, capillary     Status: Abnormal   Collection Time: 11/29/17 11:22 AM  Result Value Ref Range   Glucose-Capillary 223 (H) 65 - 99 mg/dL  Glucose, capillary     Status: Abnormal   Collection Time: 11/29/17  4:40 PM  Result Value Ref Range   Glucose-Capillary 362 (H) 65 - 99 mg/dL   Comment 1 Notify RN    Comment 2 Document in Chart   Glucose, capillary     Status: Abnormal   Collection Time: 11/29/17  9:19 PM  Result Value Ref Range   Glucose-Capillary 269 (H) 65 - 99 mg/dL   Comment 1 Notify RN    Comment 2 Document in Chart   Glucose, capillary     Status: Abnormal   Collection Time: 11/30/17  7:41 AM  Result Value Ref Range   Glucose-Capillary 321 (H) 65 - 99 mg/dL   Comment 1 Notify RN   Glucose, capillary     Status: Abnormal   Collection Time: 11/30/17 12:14 PM  Result Value Ref Range   Glucose-Capillary 304 (H) 65 - 99 mg/dL   Comment 1 Notify RN    Glucose, capillary     Status: Abnormal   Collection Time: 11/30/17  5:04 PM  Result Value Ref Range   Glucose-Capillary 135 (H) 65 - 99 mg/dL   Comment 1 Notify RN   Glucose, capillary     Status: Abnormal   Collection Time: 11/30/17  8:49 PM  Result Value Ref Range   Glucose-Capillary 195 (H) 65 - 99 mg/dL  MRSA PCR Screening     Status: None   Collection Time: 12/01/17  2:39 AM  Result Value Ref Range   MRSA by PCR NEGATIVE NEGATIVE    Comment:        The GeneXpert MRSA Assay (FDA approved for NASAL specimens only), is one component of a comprehensive MRSA colonization surveillance program. It is not intended to diagnose MRSA infection nor to guide or monitor treatment for MRSA infections. Performed at [facility], [address], [place], [place] [address]   Basic metabolic panel     Status: Abnormal   Collection Time: 12/01/17  5:32 AM  Result Value Ref Range   Sodium 136 135 - 145 mmol/L   Potassium 3.5 3.5 - 5.1 mmol/L   Chloride 93 (L) 101 - 111 mmol/L   CO2 33 (H) 22 - 32 mmol/L   Glucose, Bld 183 (H)  65 - 99 mg/dL   BUN 22 (H) 6 - 20 mg/dL   Creatinine, Ser 1.20 0.61 - 1.24 mg/dL   Calcium 8.9 8.9 - 10.3 mg/dL   GFR calc non Af Amer >60 >60 mL/min   GFR calc Af Amer >60 >60 mL/min    Comment: (NOTE) The eGFR has been calculated using the CKD EPI equation. This calculation has not been validated in all clinical situations. eGFR's persistently <60 mL/min signify possible Chronic Kidney Disease.    Anion gap 10 5 - 15    Comment: Performed at [facility], [address], [place], [place] [address]  Glucose, capillary     Status: Abnormal   Collection Time: 12/01/17  7:29 AM  Result Value Ref Range   Glucose-Capillary 161 (H) 65 - 99 mg/dL   Comment 1 Notify RN     ABGS No results for input(s): PHART, PO2ART, TCO2, HCO3 in the last 72 hours.  Invalid input(s): PCO2 CULTURES Recent Results (from the past 240 hour(s))  MRSA PCR Screening     Status:  None   Collection Time: 12/01/17  2:39 AM  Result Value Ref Range Status   MRSA by PCR NEGATIVE NEGATIVE Final    Comment:        The GeneXpert MRSA Assay (FDA approved for NASAL specimens only), is one component of a comprehensive MRSA colonization surveillance program. It is not intended to diagnose MRSA infection nor to guide or monitor treatment for MRSA infections. Performed at [name], [address] [place], [place] [address]    Studies/Results: No results found.  Medications:  Prior to Admission:  Medications Prior to Admission  Medication Sig Dispense Refill Last Dose  . albuterol (VENTOLIN HFA) 108 (90 BASE) MCG/ACT inhaler Inhale 2 puffs into the lungs every 6 (six) hours as needed for wheezing or shortness of breath.   unknown  . baclofen (LIORESAL) 20 MG tablet Take 20 mg by mouth 3 (three) times daily.    11/26/2017 at Unknown time  . benzonatate (TESSALON) 200 MG capsule Take 200 mg by mouth 3 (three) times daily.   11/26/2017 at Unknown time  . budesonide (PULMICORT) 0.25 MG/2ML nebulizer solution Take 2 mLs (0.25 mg total) by nebulization 2 (two) times daily. 60 mL 12 11/26/2017 at Unknown time  . CARTIA XT 120 MG 24 hr capsule Take 1 capsule (120 mg total) by mouth daily. 30 capsule 0 11/26/2017 at Unknown time  . ethambutol (MYAMBUTOL) 400 MG tablet Take 3 tablets (1,200 mg total) by mouth daily. 90 tablet 11 11/26/2017 at Unknown time  . furosemide (LASIX) 40 MG tablet Take 1 tablet (40 mg total) by mouth 2 (two) times daily. 60 tablet 12 11/26/2017 at Unknown time  . guaifenesin (HUMIBID E) 400 MG TABS tablet Take 400 mg by mouth every 4 (four) hours.   11/26/2017 at Unknown time  . HUMALOG KWIKPEN 100 UNIT/ML KiwkPen Sliding scale  0 11/26/2017 at Unknown time  . insulin glargine (LANTUS) 100 UNIT/ML injection Inject 0.24 mLs (24 Units total) into the skin daily. 24 units daily 10 mL 11 11/22/2017 at Unknown time  . insulin lispro (HUMALOG) 100 UNIT/ML injection  Take by sliding scale provided by hospital maximum amount daily is 60 units.  Check blood sugar 4 times a day 10 mL 11 unknown  . ipratropium (ATROVENT) 0.02 % nebulizer solution VVN TID WITH LEVALBUTEROL  5 11/26/2017 at Unknown time  . levalbuterol (XOPENEX) 0.63 MG/3ML nebulizer solution Take 3 mLs (0.63 mg  total) by nebulization every 8 (eight) hours as needed for wheezing or shortness of breath. 3 mL 12 11/26/2017 at Unknown time  . LORazepam (ATIVAN) 0.5 MG tablet Take 1 tablet (0.5 mg total) by mouth 2 (two) times daily as needed for anxiety or sleep. (Patient taking differently: Take 0.5 mg by mouth 4 (four) times daily as needed for anxiety or sleep. ) 20 tablet 0 11/26/2017 at Unknown time  . metFORMIN (GLUCOPHAGE) 500 MG tablet Take 1 tablet (500 mg total) by mouth daily with breakfast. 30 tablet 3 11/26/2017 at Unknown time  . montelukast (SINGULAIR) 10 MG tablet Take 10 mg by mouth at bedtime.   11/26/2017 at Unknown time  . nitroGLYCERIN (NITROSTAT) 0.4 MG SL tablet Place 1 tablet (0.4 mg total) under the tongue every 5 (five) minutes as needed for chest pain. 25 tablet 4 unknown  . omeprazole (PRILOSEC) 20 MG capsule Take 20 mg by mouth daily.   11/26/2017 at Unknown time  . oxyCODONE-acetaminophen (PERCOCET) 10-325 MG tablet Take 1 tablet by mouth every 4 (four) hours as needed for pain.   11/26/2017 at Unknown time  . predniSONE (DELTASONE) 10 MG tablet Take 2 daily for 1 week then 1 daily (Patient taking differently: Take 10 mg by mouth daily with breakfast. Take 2 daily for 1 week then 1 daily) 60 tablet 5 11/26/2017 at Unknown time  . rifampin (RIFADIN) 300 MG capsule Take 2 capsules (600 mg total) by mouth daily. 60 capsule 11 11/26/2017 at Unknown time  . azithromycin (ZITHROMAX) 500 MG tablet Take 1 tablet (500 mg total) by mouth daily. (Patient not taking: Reported on 11/27/2017) 30 tablet 11 Not Taking at Unknown time  . Fluticasone-Umeclidin-Vilant (TRELEGY ELLIPTA) 100-62.5-25 MCG/INH  AEPB Inhale 1 puff into the lungs daily. Rinse mouth after you use this (Patient not taking: Reported on 11/27/2017) 1 each 12 Not Taking at Unknown time   Scheduled: . azithromycin  500 mg Oral Daily  . baclofen  20 mg Oral TID  . benzonatate  200 mg Oral TID  . budesonide  0.25 mg Nebulization BID  . diltiazem  120 mg Oral Daily  . enoxaparin (LOVENOX) injection  40 mg Subcutaneous Q24H  . ethambutol  1,200 mg Oral Daily  . furosemide  40 mg Oral BID  . guaiFENesin  20 mL Oral Q4H  . insulin aspart  0-15 Units Subcutaneous TID WC  . insulin aspart  0-5 Units Subcutaneous QHS  . insulin aspart  4 Units Subcutaneous TID WC  . insulin glargine  30 Units Subcutaneous Daily  . ipratropium  0.5 mg Nebulization Q6H  . levalbuterol  1.25 mg Nebulization TID  . LORazepam  0.5 mg Oral BID  . mouth rinse  15 mL Mouth Rinse BID  . montelukast  10 mg Oral QHS  . oxyCODONE-acetaminophen  2 tablet Oral Q4H  . pantoprazole  40 mg Oral Daily  . potassium chloride  40 mEq Oral BID  . pravastatin  40 mg Oral q1800  . predniSONE  40 mg Oral Q breakfast  . rifampin  600 mg Oral Daily   Continuous:  BVQ:[id number] **OR** acetaminophen, morphine injection, nitroGLYCERIN, ondansetron **OR** ondansetron (ZOFRAN) IV, senna-docusate, sodium chloride  Assesment: He was admitted with COPD exacerbation.  He is improving.  He has acute on chronic hypoxic respiratory failure.  He has acute on chronic diastolic heart failure.  His potassium level is borderline low so that may be causing the muscle cramps.  He has diabetes which is  doing better.  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

## 2017-12-01 NOTE — Consult Note (Signed)
   [facility] Inpatient Consult   12/01/2017  Ryan [name] [birth year] [id number]   Patient is currently active with [facility] for chronic disease management services.  Patient has been engaged by a [employer].  Our community based plan of care has focused on disease management and community resource support.  Patient will receive a post discharge transition of care call and will be evaluated for monthly home visits for assessments and disease process education. [place] aware of patient's inpatient status and will resume care at discharge. Of note, [facility] Care Management services does not replace or interfere with any services that are needed or arranged by inpatient case management or social work.  For additional questions or referrals please contact:  Koda Defrank RN, [facility] Liaison  [phone]) [place] [phone]) Toll free office

## 2017-12-02 LAB — BASIC METABOLIC PANEL
ANION GAP: 11 (ref 5–15)
BUN: 24 mg/dL — ABNORMAL HIGH (ref 6–20)
CHLORIDE: 93 mmol/L — AB (ref 101–111)
CO2: 33 mmol/L — AB (ref 22–32)
CREATININE: 1.13 mg/dL (ref 0.61–1.24)
Calcium: 9 mg/dL (ref 8.9–10.3)
GFR calc non Af Amer: >60 mL/min (ref >60)
GLUCOSE: 233 mg/dL — AB (ref 65–99)
Potassium: 3.7 mmol/L (ref 3.5–5.1)
Sodium: 137 mmol/L (ref 135–145)

## 2017-12-02 LAB — GLUCOSE, CAPILLARY
GLUCOSE-CAPILLARY: 264 mg/dL — AB (ref 65–99)
GLUCOSE-CAPILLARY: 152 mg/dL — AB (ref 65–99)
Glucose-Capillary: 201 mg/dL — ABNORMAL HIGH (ref 65–99)
Glucose-Capillary: 186 mg/dL — ABNORMAL HIGH (ref 65–99)

## 2017-12-02 MED ORDER — FUROSEMIDE 40 MG PO TABS
40.0000 mg | ORAL_TABLET | Freq: Every day | ORAL | Status: DC
  Administered 2017-12-02 – 2017-12-03 (×2): 40 mg via ORAL
  Filled 2017-12-02 (×2): qty 1

## 2017-12-02 MED ORDER — OXYMETAZOLINE HCL 0.05 % NA SOLN
1.0000 | Freq: Two times a day (BID) | NASAL | Status: DC
  Administered 2017-12-02 – 2017-12-03 (×2): 1 via NASAL
  Filled 2017-12-02: qty 15

## 2017-12-02 NOTE — Progress Notes (Signed)
Subjective: He feels better.  He is complaining that his nose is stopped up.  He still has some shortness of breath.  He is still coughing.  He feels like he still has some swelling but I cannot see that.  Objective: Vital signs in last 24 hours: Temp:  [97.8 F (36.6 C)-98.2 F (36.8 C)] 98.2 F (36.8 C) (05/22 0506) Pulse Rate:  [86-95] 92 (05/22 0506) Resp:  [16-21] 17 (05/22 0506) BP: (112-129)/(80-86) 112/86 (05/22 0506) SpO2:  [92 %-99 %] 99 % (05/22 0808) Weight change:  Last BM Date: 11/30/17  Intake/Output from previous day: 05/21 0701 - 05/22 0700 In: 480 [P.O.:480] Out: 2150 [Urine:2150]  PHYSICAL EXAM General appearance: alert, cooperative and no distress Resp: rhonchi bilaterally Cardio: regular rate and rhythm, S1, S2 normal, no murmur, click, rub or gallop GI: soft, non-tender; bowel sounds normal; no masses,  no organomegaly Extremities: extremities normal, atraumatic, no cyanosis or edema  Lab Results:  Results for orders placed or performed during the hospital encounter of 11/27/17 (from the past 48 hour(s))  Glucose, capillary     Status: Abnormal   Collection Time: 11/30/17 12:14 PM  Result Value Ref Range   Glucose-Capillary 304 (H) 65 - 99 mg/dL   Comment 1 Notify RN   Glucose, capillary     Status: Abnormal   Collection Time: 11/30/17  5:04 PM  Result Value Ref Range   Glucose-Capillary 135 (H) 65 - 99 mg/dL   Comment 1 Notify RN   Glucose, capillary     Status: Abnormal   Collection Time: 11/30/17  8:49 PM  Result Value Ref Range   Glucose-Capillary 195 (H) 65 - 99 mg/dL  MRSA PCR Screening     Status: None   Collection Time: 12/01/17  2:39 AM  Result Value Ref Range   MRSA by PCR NEGATIVE NEGATIVE    Comment:        The GeneXpert MRSA Assay (FDA approved for NASAL specimens only), is one component of a comprehensive MRSA colonization surveillance program. It is not intended to diagnose MRSA infection nor to guide or monitor treatment  for MRSA infections. Performed at [facility], [address], [place], [place] [address]   Basic metabolic panel     Status: Abnormal   Collection Time: 12/01/17  5:32 AM  Result Value Ref Range   Sodium 136 135 - 145 mmol/L   Potassium 3.5 3.5 - 5.1 mmol/L   Chloride 93 (L) 101 - 111 mmol/L   CO2 33 (H) 22 - 32 mmol/L   Glucose, Bld 183 (H) 65 - 99 mg/dL   BUN 22 (H) 6 - 20 mg/dL   Creatinine, Ser 1.20 0.61 - 1.24 mg/dL   Calcium 8.9 8.9 - 10.3 mg/dL   GFR calc non Af Amer >60 >60 mL/min   GFR calc Af Amer >60 >60 mL/min    Comment: (NOTE) The eGFR has been calculated using the CKD EPI equation. This calculation has not been validated in all clinical situations. eGFR's persistently <60 mL/min signify possible Chronic Kidney Disease.    Anion gap 10 5 - 15    Comment: Performed at [facility], [address] [place], [place] [address]  Magnesium     Status: None   Collection Time: 12/01/17  5:32 AM  Result Value Ref Range   Magnesium 2.1 1.7 - 2.4 mg/dL    Comment: Performed at [facility], [address], [place], [place] [address]  Glucose, capillary     Status:  Abnormal   Collection Time: 12/01/17  7:29 AM  Result Value Ref Range   Glucose-Capillary 161 (H) 65 - 99 mg/dL   Comment 1 Notify RN   Glucose, capillary     Status: Abnormal   Collection Time: 12/01/17 11:36 AM  Result Value Ref Range   Glucose-Capillary 280 (H) 65 - 99 mg/dL   Comment 1 Notify RN   Glucose, capillary     Status: Abnormal   Collection Time: 12/01/17  4:51 PM  Result Value Ref Range   Glucose-Capillary 186 (H) 65 - 99 mg/dL   Comment 1 Notify RN   Glucose, capillary     Status: Abnormal   Collection Time: 12/01/17  9:38 PM  Result Value Ref Range   Glucose-Capillary 205 (H) 65 - 99 mg/dL   Comment 1 Notify RN    Comment 2 Document in Chart   Basic metabolic panel     Status: Abnormal   Collection Time: 12/02/17  4:35 AM  Result Value Ref Range   Sodium 137 135 - 145 mmol/L    Potassium 3.7 3.5 - 5.1 mmol/L   Chloride 93 (L) 101 - 111 mmol/L   CO2 33 (H) 22 - 32 mmol/L   Glucose, Bld 233 (H) 65 - 99 mg/dL   BUN 24 (H) 6 - 20 mg/dL   Creatinine, Ser 1.13 0.61 - 1.24 mg/dL   Calcium 9.0 8.9 - 10.3 mg/dL   GFR calc non Af Amer >60 >60 mL/min   GFR calc Af Amer >60 >60 mL/min    Comment: (NOTE) The eGFR has been calculated using the CKD EPI equation. This calculation has not been validated in all clinical situations. eGFR's persistently <60 mL/min signify possible Chronic Kidney Disease.    Anion gap 11 5 - 15    Comment: Performed at [facility], [address], [place], [place] [address]  Glucose, capillary     Status: Abnormal   Collection Time: 12/02/17  7:44 AM  Result Value Ref Range   Glucose-Capillary 264 (H) 65 - 99 mg/dL   Comment 1 Notify RN    Comment 2 Document in Chart     ABGS No results for input(s): PHART, PO2ART, TCO2, HCO3 in the last 72 hours.  Invalid input(s): PCO2 CULTURES Recent Results (from the past 240 hour(s))  MRSA PCR Screening     Status: None   Collection Time: 12/01/17  2:39 AM  Result Value Ref Range Status   MRSA by PCR NEGATIVE NEGATIVE Final    Comment:        The GeneXpert MRSA Assay (FDA approved for NASAL specimens only), is one component of a comprehensive MRSA colonization surveillance program. It is not intended to diagnose MRSA infection nor to guide or monitor treatment for MRSA infections. Performed at [facility], [address], [place], [place] [address]    Studies/Results: No results found.  Medications:  Prior to Admission:  Medications Prior to Admission  Medication Sig Dispense Refill Last Dose  . albuterol (VENTOLIN HFA) 108 (90 BASE) MCG/ACT inhaler Inhale 2 puffs into the lungs every 6 (six) hours as needed for wheezing or shortness of breath.   unknown  . baclofen (LIORESAL) 20 MG tablet Take 20 mg by mouth 3 (three) times daily.    11/26/2017 at Unknown time  . benzonatate  (TESSALON) 200 MG capsule Take 200 mg by mouth 3 (three) times daily.   11/26/2017 at Unknown time  . budesonide (PULMICORT) 0.25 MG/2ML nebulizer solution Take 2  mLs (0.25 mg total) by nebulization 2 (two) times daily. 60 mL 12 11/26/2017 at Unknown time  . CARTIA XT 120 MG 24 hr capsule Take 1 capsule (120 mg total) by mouth daily. 30 capsule 0 11/26/2017 at Unknown time  . ethambutol (MYAMBUTOL) 400 MG tablet Take 3 tablets (1,200 mg total) by mouth daily. 90 tablet 11 11/26/2017 at Unknown time  . furosemide (LASIX) 40 MG tablet Take 1 tablet (40 mg total) by mouth 2 (two) times daily. 60 tablet 12 11/26/2017 at Unknown time  . guaifenesin (HUMIBID E) 400 MG TABS tablet Take 400 mg by mouth every 4 (four) hours.   11/26/2017 at Unknown time  . HUMALOG KWIKPEN 100 UNIT/ML KiwkPen Sliding scale  0 11/26/2017 at Unknown time  . insulin glargine (LANTUS) 100 UNIT/ML injection Inject 0.24 mLs (24 Units total) into the skin daily. 24 units daily 10 mL 11 11/22/2017 at Unknown time  . insulin lispro (HUMALOG) 100 UNIT/ML injection Take by sliding scale provided by hospital maximum amount daily is 60 units.  Check blood sugar 4 times a day 10 mL 11 unknown  . ipratropium (ATROVENT) 0.02 % nebulizer solution VVN TID WITH LEVALBUTEROL  5 11/26/2017 at Unknown time  . levalbuterol (XOPENEX) 0.63 MG/3ML nebulizer solution Take 3 mLs (0.63 mg total) by nebulization every 8 (eight) hours as needed for wheezing or shortness of breath. 3 mL 12 11/26/2017 at Unknown time  . LORazepam (ATIVAN) 0.5 MG tablet Take 1 tablet (0.5 mg total) by mouth 2 (two) times daily as needed for anxiety or sleep. (Patient taking differently: Take 0.5 mg by mouth 4 (four) times daily as needed for anxiety or sleep. ) 20 tablet 0 11/26/2017 at Unknown time  . metFORMIN (GLUCOPHAGE) 500 MG tablet Take 1 tablet (500 mg total) by mouth daily with breakfast. 30 tablet 3 11/26/2017 at Unknown time  . montelukast (SINGULAIR) 10 MG tablet Take 10 mg by  mouth at bedtime.   11/26/2017 at Unknown time  . nitroGLYCERIN (NITROSTAT) 0.4 MG SL tablet Place 1 tablet (0.4 mg total) under the tongue every 5 (five) minutes as needed for chest pain. 25 tablet 4 unknown  . omeprazole (PRILOSEC) 20 MG capsule Take 20 mg by mouth daily.   11/26/2017 at Unknown time  . oxyCODONE-acetaminophen (PERCOCET) 10-325 MG tablet Take 1 tablet by mouth every 4 (four) hours as needed for pain.   11/26/2017 at Unknown time  . predniSONE (DELTASONE) 10 MG tablet Take 2 daily for 1 week then 1 daily (Patient taking differently: Take 10 mg by mouth daily with breakfast. Take 2 daily for 1 week then 1 daily) 60 tablet 5 11/26/2017 at Unknown time  . rifampin (RIFADIN) 300 MG capsule Take 2 capsules (600 mg total) by mouth daily. 60 capsule 11 11/26/2017 at Unknown time  . azithromycin (ZITHROMAX) 500 MG tablet Take 1 tablet (500 mg total) by mouth daily. (Patient not taking: Reported on 11/27/2017) 30 tablet 11 Not Taking at Unknown time  . Fluticasone-Umeclidin-Vilant (TRELEGY ELLIPTA) 100-62.5-25 MCG/INH AEPB Inhale 1 puff into the lungs daily. Rinse mouth after you use this (Patient not taking: Reported on 11/27/2017) 1 each 12 Not Taking at Unknown time   Scheduled: . azithromycin  500 mg Oral Daily  . baclofen  20 mg Oral TID  . benzonatate  200 mg Oral TID  . budesonide  0.25 mg Nebulization BID  . diltiazem  120 mg Oral Daily  . enoxaparin (LOVENOX) injection  40 mg Subcutaneous Q24H  .  ethambutol  1,200 mg Oral Daily  . furosemide  40 mg Oral Daily  . guaiFENesin  20 mL Oral Q4H  . insulin aspart  0-15 Units Subcutaneous TID WC  . insulin aspart  0-5 Units Subcutaneous QHS  . insulin aspart  4 Units Subcutaneous TID WC  . insulin glargine  30 Units Subcutaneous Daily  . ipratropium  0.5 mg Nebulization TID  . levalbuterol  1.25 mg Nebulization TID  . LORazepam  0.5 mg Oral BID  . mouth rinse  15 mL Mouth Rinse BID  . montelukast  10 mg Oral QHS  .  oxyCODONE-acetaminophen  2 tablet Oral Q4H  . oxymetazoline  1 spray Each Nare BID  . pantoprazole  40 mg Oral Daily  . potassium chloride  40 mEq Oral BID  . pravastatin  40 mg Oral q1800  . predniSONE  40 mg Oral Q breakfast  . rifampin  600 mg Oral Daily   Continuous:  VCB:[id number] **OR** acetaminophen, morphine injection, nitroGLYCERIN, ondansetron **OR** ondansetron (ZOFRAN) IV, senna-docusate, sodium chloride  Assesment: He has COPD with acute exacerbation.  He is better but still coughing and congested.  He still has some shortness of breath.    He has acute on chronic diastolic heart failure which is much improved.  I will switch him to oral furosemide today  He has acute on chronic hypoxic respiratory failure still on oxygen.  He has diabetes which is doing a little better.  This should improve since he is been switched to prednisone  He has Mycobacterium avium infection and he is on treatment for that  He has nasal congestion and has been using saline without much help so I am going to give him Neo-Synephrine Principal Problem:   COPD with acute exacerbation ([place]) Active Problems:   Hyperlipidemia   Type 2 diabetes mellitus without complication ([place])   Protein-calorie malnutrition, severe (HCC)   Paroxysmal atrial fibrillation (HCC)   Chronic respiratory failure (HCC)   Chronic respiratory failure with hypoxia ([place])   MAI (mycobacterium avium-intracellulare) ([place])   Umbilical hernia    Plan: Continue treatments.  Potential discharge soon    LOS: 5 days   Ryan [name] L 12/02/2017, 8:35 AM

## 2017-12-03 ENCOUNTER — Other Ambulatory Visit: Payer: Self-pay

## 2017-12-03 LAB — GLUCOSE, CAPILLARY
Glucose-Capillary: 196 mg/dL — ABNORMAL HIGH (ref 65–99)
Glucose-Capillary: 184 mg/dL — ABNORMAL HIGH (ref 65–99)

## 2017-12-03 LAB — BASIC METABOLIC PANEL
Anion gap: 8 (ref 5–15)
BUN: 23 mg/dL — ABNORMAL HIGH (ref 6–20)
CALCIUM: 8.6 mg/dL — AB (ref 8.9–10.3)
CO2: 35 mmol/L — AB (ref 22–32)
CREATININE: 1.03 mg/dL (ref 0.61–1.24)
Chloride: 96 mmol/L — ABNORMAL LOW (ref 101–111)
GFR calc Af Amer: >60 mL/min (ref >60)
GFR calc non Af Amer: >60 mL/min (ref >60)
GLUCOSE: 193 mg/dL — AB (ref 65–99)
Potassium: 3.8 mmol/L (ref 3.5–5.1)
Sodium: 139 mmol/L (ref 135–145)

## 2017-12-03 NOTE — Care Management Important Message (Signed)
Important Message  Patient Details  Name: Ryan [name] MRN: [id number] Date of Birth: [birth year]   Medicare Important Message Given:  Yes    Sherald Barge, RN 12/03/2017, 12:13 PM

## 2017-12-03 NOTE — Progress Notes (Signed)
Patient discharged home with instructions given on medications,and follow up visits,patient verbalized understanding. Oxygen at 6 liters nasal canula. Accompanied by staff to an awaiting vehicle.

## 2017-12-03 NOTE — Progress Notes (Signed)
He says he feels much better and wants to go home.  He is still coughing but not as short of breath has no swelling in his back at baseline plan is for discharge home today

## 2017-12-03 NOTE — Care Management Note (Signed)
Case Management Note  Patient Details  Name: Ryan [name] MRN: [id number] Date of Birth: 1960/09/02  Expected Discharge Date:       12/03/17           Expected Discharge Plan:  [place]  In-House Referral:  NA  Discharge planning Services  CM Consult  Post Acute Care Choice:  Resumption of Svcs/PTA Provider, Home Health Choice offered to:  Patient  DME Arranged:    DME Agency:     HH Arranged:  RN [place] Agency:  [facility]  Status of Service:  In process, will continue to follow  If discussed at Long Length of Stay Meetings, dates discussed:  12/03/17  Additional Comments: DC home today with resumption of [place] services. Pt aware HH has 48 hrs to make resumption visit and Juliann Pulse, [facility] rep, aware of DC planned for today. Pt currently active with [facility].   Sherald Barge, RN 12/03/2017, 12:11 PM

## 2017-12-04 DIAGNOSIS — I480 Paroxysmal atrial fibrillation: Secondary | ICD-10-CM | POA: Diagnosis not present

## 2017-12-04 DIAGNOSIS — J8410 Pulmonary fibrosis, unspecified: Secondary | ICD-10-CM | POA: Diagnosis not present

## 2017-12-04 DIAGNOSIS — E43 Unspecified severe protein-calorie malnutrition: Secondary | ICD-10-CM | POA: Diagnosis not present

## 2017-12-04 DIAGNOSIS — D649 Anemia, unspecified: Secondary | ICD-10-CM | POA: Diagnosis not present

## 2017-12-04 DIAGNOSIS — Z9981 Dependence on supplemental oxygen: Secondary | ICD-10-CM | POA: Diagnosis not present

## 2017-12-04 DIAGNOSIS — J449 Chronic obstructive pulmonary disease, unspecified: Secondary | ICD-10-CM | POA: Diagnosis not present

## 2017-12-04 DIAGNOSIS — E785 Hyperlipidemia, unspecified: Secondary | ICD-10-CM | POA: Diagnosis not present

## 2017-12-04 DIAGNOSIS — Z794 Long term (current) use of insulin: Secondary | ICD-10-CM | POA: Diagnosis not present

## 2017-12-04 DIAGNOSIS — E1165 Type 2 diabetes mellitus with hyperglycemia: Secondary | ICD-10-CM | POA: Diagnosis not present

## 2017-12-04 DIAGNOSIS — R600 Localized edema: Secondary | ICD-10-CM | POA: Diagnosis not present

## 2017-12-04 DIAGNOSIS — J9611 Chronic respiratory failure with hypoxia: Secondary | ICD-10-CM | POA: Diagnosis not present

## 2017-12-04 NOTE — Discharge Summary (Signed)
Physician Discharge Summary  Patient ID: Ryan [name] MRN: [id number] DOB/AGE: [birth year] 59 y.o. Primary Care Physician:Janey Petron, Percell Miller, MD Admit date: 11/27/2017 Discharge date: 12/04/2017    Discharge Diagnoses:   Principal Problem:   COPD with acute exacerbation [facility]) Active Problems:   Hyperlipidemia   Type 2 diabetes mellitus without complication (HCC)   Protein-calorie malnutrition, severe (HCC)   Paroxysmal atrial fibrillation (HCC)   Chronic respiratory failure (HCC)   Chronic respiratory failure with hypoxia (HCC)   MAI (mycobacterium avium-intracellulare) (HCC)   Acute on chronic diastolic heart failure (HCC)   Umbilical hernia   Allergies as of 12/03/2017      Reactions   Albuterol Palpitations   Ciprofloxacin Other (See Comments)   Patient experiences vision loss from long-term and short-term use   Influenza Vaccine Live Swelling      Medication List    TAKE these medications   azithromycin 500 MG tablet Commonly known as:  ZITHROMAX Take 1 tablet (500 mg total) by mouth daily.   baclofen 20 MG tablet Commonly known as:  LIORESAL Take 20 mg by mouth 3 (three) times daily.   benzonatate 200 MG capsule Commonly known as:  TESSALON Take 200 mg by mouth 3 (three) times daily.   budesonide 0.25 MG/2ML nebulizer solution Commonly known as:  PULMICORT Take 2 mLs (0.25 mg total) by nebulization 2 (two) times daily.   CARTIA XT 120 MG 24 hr capsule Generic drug:  diltiazem Take 1 capsule (120 mg total) by mouth daily.   ethambutol 400 MG tablet Commonly known as:  MYAMBUTOL Take 3 tablets (1,200 mg total) by mouth daily.   Fluticasone-Umeclidin-Vilant 100-62.5-25 MCG/INH Aepb Commonly known as:  TRELEGY ELLIPTA Inhale 1 puff into the lungs daily. Rinse mouth after you use this   furosemide 40 MG tablet Commonly known as:  LASIX Take 1 tablet (40 mg total) by mouth 2 (two) times daily.   guaifenesin 400 MG Tabs tablet Commonly known as:  HUMIBID  E Take 400 mg by mouth every 4 (four) hours.   insulin glargine 100 UNIT/ML injection Commonly known as:  LANTUS Inject 0.24 mLs (24 Units total) into the skin daily. 24 units daily   insulin lispro 100 UNIT/ML injection Commonly known as:  HUMALOG Take by sliding scale provided by hospital maximum amount daily is 60 units.  Check blood sugar 4 times a day   HUMALOG KWIKPEN 100 UNIT/ML KiwkPen Generic drug:  insulin lispro Sliding scale   ipratropium 0.02 % nebulizer solution Commonly known as:  ATROVENT VVN TID WITH LEVALBUTEROL   levalbuterol 0.63 MG/3ML nebulizer solution Commonly known as:  XOPENEX Take 3 mLs (0.63 mg total) by nebulization every 8 (eight) hours as needed for wheezing or shortness of breath.   LORazepam 0.5 MG tablet Commonly known as:  ATIVAN Take 1 tablet (0.5 mg total) by mouth 2 (two) times daily as needed for anxiety or sleep. What changed:  when to take this   metFORMIN 500 MG tablet Commonly known as:  GLUCOPHAGE Take 1 tablet (500 mg total) by mouth daily with breakfast.   montelukast 10 MG tablet Commonly known as:  SINGULAIR Take 10 mg by mouth at bedtime.   nitroGLYCERIN 0.4 MG SL tablet Commonly known as:  NITROSTAT Place 1 tablet (0.4 mg total) under the tongue every 5 (five) minutes as needed for chest pain.   omeprazole 20 MG capsule Commonly known as:  PRILOSEC Take 20 mg by mouth daily.   oxyCODONE-acetaminophen 10-325 MG tablet Commonly  known as:  PERCOCET Take 1 tablet by mouth every 4 (four) hours as needed for pain.   predniSONE 10 MG tablet Commonly known as:  DELTASONE Take 2 daily for 1 week then 1 daily What changed:    how much to take  how to take this  when to take this  additional instructions   rifampin 300 MG capsule Commonly known as:  RIFADIN Take 2 capsules (600 mg total) by mouth daily.   VENTOLIN HFA 108 (90 Base) MCG/ACT inhaler Generic drug:  albuterol Inhale 2 puffs into the lungs every 6  (six) hours as needed for wheezing or shortness of breath.       Discharged Condition: Improved    Consults: None  Significant Diagnostic Studies: Dg Chest Port 1 View  Result Date: 11/27/2017 CLINICAL DATA:  Cough, shortness of breath, fever. EXAM: PORTABLE CHEST 1 VIEW COMPARISON:  Radiographs of October 17, 2017. FINDINGS: The heart size and mediastinal contours are within normal limits. Stable emphysematous disease is noted in both upper lobes. Stable bibasilar opacities are noted concerning for atelectasis or scarring. Cavitary lesions noted on prior exam are not well visualized currently. No pneumothorax or significant pleural effusion is noted. The visualized skeletal structures are unremarkable. IMPRESSION: Stable bibasilar scarring or atelectasis is noted. Stable severe emphysematous disease is noted in the upper lobes. Cavitary lesions in left lung are not well visualized on this study, and may be better evaluated with CT scan. Emphysema (ICD10-J43.9). Electronically Signed   By: Marijo Conception, M.D.   On: 11/27/2017 11:53    Lab Results: Basic Metabolic Panel: Recent Labs    12/02/17 0435 12/03/17 0619  NA 137 139  K 3.7 3.8  CL 93* 96*  CO2 33* 35*  GLUCOSE 233* 193*  BUN 24* 23*  CREATININE 1.13 1.03  CALCIUM 9.0 8.6*   Liver Function Tests: No results for input(s): AST, ALT, ALKPHOS, BILITOT, PROT, ALBUMIN in the last 72 hours.   CBC: No results for input(s): WBC, NEUTROABS, HGB, HCT, MCV, PLT in the last 72 hours.  Recent Results (from the past 240 hour(s))  MRSA PCR Screening     Status: None   Collection Time: 12/01/17  2:39 AM  Result Value Ref Range Status   MRSA by PCR NEGATIVE NEGATIVE Final    Comment:        The GeneXpert MRSA Assay (FDA approved for NASAL specimens only), is one component of a comprehensive MRSA colonization surveillance program. It is not intended to diagnose MRSA infection nor to guide or monitor treatment for MRSA  infections. Performed at [facility], [address], [place], [place] [address]      Hospital Course: This is a 57 year old who came to the emergency department with increasing shortness of breath.  He is known to have severe COPD as well as Mycobacterium avium infection.  He was admitted with COPD exacerbation.  He was started on IV steroids IV antibiotics and improved.  He was clinically dehydrated on admission and was given IV fluids and did develop some swelling of his legs presumably acute on chronic diastolic heart failure.  He had some trouble with nasal congestion and also some trouble with solid food dysphagia.  By the time of discharge she was back at baseline not short of breath and plans are made for him to have work-up of dysphagia as an outpatient.  His swelling was gone.  Discharge Exam: Blood pressure 114/68, pulse 60, temperature 98.6 F (37 C), temperature  source Oral, resp. rate 18, height 5\' 8"  (1.727 m), weight 66.9 kg (147 lb 7.8 oz), SpO2 99 %. He is awake and alert.  Chest is clear.  Heart regular.  No swelling.  Disposition: Home with home health services outpatient work-up of dysphagia    Follow-up Information    [name], MD. Call.   Specialty:  Pulmonary Disease Why:  call office to make appointment for 1 week. Contact information: [place] [address] [phone]           Signed: Tracker Mance L   12/04/2017, 7:43 AM

## 2017-12-08 ENCOUNTER — Telehealth: Payer: Self-pay | Admitting: *Deleted

## 2017-12-08 ENCOUNTER — Other Ambulatory Visit: Payer: Self-pay | Admitting: *Deleted

## 2017-12-08 DIAGNOSIS — Z794 Long term (current) use of insulin: Secondary | ICD-10-CM | POA: Diagnosis not present

## 2017-12-08 DIAGNOSIS — J449 Chronic obstructive pulmonary disease, unspecified: Secondary | ICD-10-CM | POA: Diagnosis not present

## 2017-12-08 DIAGNOSIS — J8410 Pulmonary fibrosis, unspecified: Secondary | ICD-10-CM | POA: Diagnosis not present

## 2017-12-08 DIAGNOSIS — D649 Anemia, unspecified: Secondary | ICD-10-CM | POA: Diagnosis not present

## 2017-12-08 DIAGNOSIS — E43 Unspecified severe protein-calorie malnutrition: Secondary | ICD-10-CM | POA: Diagnosis not present

## 2017-12-08 DIAGNOSIS — I480 Paroxysmal atrial fibrillation: Secondary | ICD-10-CM | POA: Diagnosis not present

## 2017-12-08 DIAGNOSIS — Z9981 Dependence on supplemental oxygen: Secondary | ICD-10-CM | POA: Diagnosis not present

## 2017-12-08 DIAGNOSIS — E1165 Type 2 diabetes mellitus with hyperglycemia: Secondary | ICD-10-CM | POA: Diagnosis not present

## 2017-12-08 DIAGNOSIS — E785 Hyperlipidemia, unspecified: Secondary | ICD-10-CM | POA: Diagnosis not present

## 2017-12-08 DIAGNOSIS — J9611 Chronic respiratory failure with hypoxia: Secondary | ICD-10-CM | POA: Diagnosis not present

## 2017-12-08 DIAGNOSIS — R600 Localized edema: Secondary | ICD-10-CM | POA: Diagnosis not present

## 2017-12-08 LAB — MAC SUSCEPTIBILITY BROTH

## 2017-12-08 NOTE — Patient Outreach (Signed)
Opened in error

## 2017-12-08 NOTE — Patient Outreach (Signed)
[facility]) Care Management  12/08/2017  Kewon Statler Lepera [birth year] [id number]   Late entry for 10/07/17   Transition of care   [facility] CM spoke with Mr and Mrs Noguez on 10/07/17 to follow up for transition of care services Mr Graf denied no issues with medications, DME or follow up appointments  Mr Wyne was followed for transition of care services q 1-2 weeks   Joelene Millin L. Lavina Hamman, RN, BSN, [place] Coordinator Direct number [phone]  [facility] number [phone] Fax number [phone]

## 2017-12-09 ENCOUNTER — Ambulatory Visit (HOSPITAL_COMMUNITY)
Admission: RE | Admit: 2017-12-09 | Discharge: 2017-12-09 | Disposition: A | Discharge status: Home / Self Care | Payer: Medicare Other | Source: Ambulatory Visit | Attending: Pulmonary Disease | Admitting: Pulmonary Disease

## 2017-12-09 ENCOUNTER — Other Ambulatory Visit (HOSPITAL_COMMUNITY): Payer: Self-pay | Admitting: Pulmonary Disease

## 2017-12-09 DIAGNOSIS — A318 Other mycobacterial infections: Secondary | ICD-10-CM | POA: Diagnosis not present

## 2017-12-09 DIAGNOSIS — J9611 Chronic respiratory failure with hypoxia: Secondary | ICD-10-CM | POA: Diagnosis not present

## 2017-12-09 DIAGNOSIS — J449 Chronic obstructive pulmonary disease, unspecified: Secondary | ICD-10-CM | POA: Diagnosis not present

## 2017-12-09 DIAGNOSIS — R112 Nausea with vomiting, unspecified: Secondary | ICD-10-CM | POA: Diagnosis not present

## 2017-12-09 DIAGNOSIS — R109 Unspecified abdominal pain: Secondary | ICD-10-CM | POA: Diagnosis not present

## 2017-12-09 DIAGNOSIS — I700 Atherosclerosis of aorta: Secondary | ICD-10-CM | POA: Diagnosis not present

## 2017-12-09 DIAGNOSIS — E119 Type 2 diabetes mellitus without complications: Secondary | ICD-10-CM | POA: Diagnosis not present

## 2017-12-09 MED ORDER — IOPAMIDOL (ISOVUE-300) INJECTION 61%
100.0000 mL | Freq: Once | INTRAVENOUS | Status: AC | PRN
  Administered 2017-12-09: 100 mL via INTRAVENOUS

## 2017-12-15 DIAGNOSIS — E785 Hyperlipidemia, unspecified: Secondary | ICD-10-CM | POA: Diagnosis not present

## 2017-12-15 DIAGNOSIS — Z9981 Dependence on supplemental oxygen: Secondary | ICD-10-CM | POA: Diagnosis not present

## 2017-12-15 DIAGNOSIS — E43 Unspecified severe protein-calorie malnutrition: Secondary | ICD-10-CM | POA: Diagnosis not present

## 2017-12-15 DIAGNOSIS — J8410 Pulmonary fibrosis, unspecified: Secondary | ICD-10-CM | POA: Diagnosis not present

## 2017-12-15 DIAGNOSIS — R600 Localized edema: Secondary | ICD-10-CM | POA: Diagnosis not present

## 2017-12-15 DIAGNOSIS — E1165 Type 2 diabetes mellitus with hyperglycemia: Secondary | ICD-10-CM | POA: Diagnosis not present

## 2017-12-15 DIAGNOSIS — D649 Anemia, unspecified: Secondary | ICD-10-CM | POA: Diagnosis not present

## 2017-12-15 DIAGNOSIS — J449 Chronic obstructive pulmonary disease, unspecified: Secondary | ICD-10-CM | POA: Diagnosis not present

## 2017-12-15 DIAGNOSIS — Z794 Long term (current) use of insulin: Secondary | ICD-10-CM | POA: Diagnosis not present

## 2017-12-15 DIAGNOSIS — J9611 Chronic respiratory failure with hypoxia: Secondary | ICD-10-CM | POA: Diagnosis not present

## 2017-12-15 DIAGNOSIS — I480 Paroxysmal atrial fibrillation: Secondary | ICD-10-CM | POA: Diagnosis not present

## 2017-12-22 DIAGNOSIS — J449 Chronic obstructive pulmonary disease, unspecified: Secondary | ICD-10-CM | POA: Diagnosis not present

## 2017-12-23 DIAGNOSIS — D649 Anemia, unspecified: Secondary | ICD-10-CM | POA: Diagnosis not present

## 2017-12-23 DIAGNOSIS — Z9981 Dependence on supplemental oxygen: Secondary | ICD-10-CM | POA: Diagnosis not present

## 2017-12-23 DIAGNOSIS — Z794 Long term (current) use of insulin: Secondary | ICD-10-CM | POA: Diagnosis not present

## 2017-12-23 DIAGNOSIS — E43 Unspecified severe protein-calorie malnutrition: Secondary | ICD-10-CM | POA: Diagnosis not present

## 2017-12-23 DIAGNOSIS — E1165 Type 2 diabetes mellitus with hyperglycemia: Secondary | ICD-10-CM | POA: Diagnosis not present

## 2017-12-23 DIAGNOSIS — E785 Hyperlipidemia, unspecified: Secondary | ICD-10-CM | POA: Diagnosis not present

## 2017-12-23 DIAGNOSIS — J9611 Chronic respiratory failure with hypoxia: Secondary | ICD-10-CM | POA: Diagnosis not present

## 2017-12-23 DIAGNOSIS — J8410 Pulmonary fibrosis, unspecified: Secondary | ICD-10-CM | POA: Diagnosis not present

## 2017-12-23 DIAGNOSIS — I480 Paroxysmal atrial fibrillation: Secondary | ICD-10-CM | POA: Diagnosis not present

## 2017-12-23 DIAGNOSIS — R600 Localized edema: Secondary | ICD-10-CM | POA: Diagnosis not present

## 2017-12-23 DIAGNOSIS — J449 Chronic obstructive pulmonary disease, unspecified: Secondary | ICD-10-CM | POA: Diagnosis not present

## 2017-12-29 ENCOUNTER — Ambulatory Visit: Payer: Medicare Other | Admitting: Internal Medicine

## 2017-12-30 ENCOUNTER — Encounter: Payer: Self-pay | Admitting: Internal Medicine

## 2017-12-30 ENCOUNTER — Ambulatory Visit (INDEPENDENT_AMBULATORY_CARE_PROVIDER_SITE_OTHER): Payer: Medicare Other | Admitting: Internal Medicine

## 2017-12-30 VITALS — BP 128/78 | HR 79 | Temp 98.0°F | Resp 22 | Ht 68.0 in | Wt 154.0 lb

## 2017-12-30 DIAGNOSIS — J9611 Chronic respiratory failure with hypoxia: Secondary | ICD-10-CM | POA: Diagnosis not present

## 2017-12-30 DIAGNOSIS — D649 Anemia, unspecified: Secondary | ICD-10-CM | POA: Diagnosis not present

## 2017-12-30 DIAGNOSIS — Z794 Long term (current) use of insulin: Secondary | ICD-10-CM | POA: Diagnosis not present

## 2017-12-30 DIAGNOSIS — H539 Unspecified visual disturbance: Secondary | ICD-10-CM | POA: Diagnosis not present

## 2017-12-30 DIAGNOSIS — Z5181 Encounter for therapeutic drug level monitoring: Secondary | ICD-10-CM | POA: Insufficient documentation

## 2017-12-30 DIAGNOSIS — A310 Pulmonary mycobacterial infection: Secondary | ICD-10-CM

## 2017-12-30 DIAGNOSIS — Z9981 Dependence on supplemental oxygen: Secondary | ICD-10-CM | POA: Diagnosis not present

## 2017-12-30 DIAGNOSIS — I480 Paroxysmal atrial fibrillation: Secondary | ICD-10-CM | POA: Diagnosis not present

## 2017-12-30 DIAGNOSIS — J8410 Pulmonary fibrosis, unspecified: Secondary | ICD-10-CM | POA: Diagnosis not present

## 2017-12-30 DIAGNOSIS — E43 Unspecified severe protein-calorie malnutrition: Secondary | ICD-10-CM | POA: Diagnosis not present

## 2017-12-30 DIAGNOSIS — J449 Chronic obstructive pulmonary disease, unspecified: Secondary | ICD-10-CM | POA: Diagnosis not present

## 2017-12-30 DIAGNOSIS — R600 Localized edema: Secondary | ICD-10-CM | POA: Diagnosis not present

## 2017-12-30 DIAGNOSIS — E1165 Type 2 diabetes mellitus with hyperglycemia: Secondary | ICD-10-CM | POA: Diagnosis not present

## 2017-12-30 DIAGNOSIS — E785 Hyperlipidemia, unspecified: Secondary | ICD-10-CM | POA: Diagnosis not present

## 2017-12-30 NOTE — Progress Notes (Signed)
   Subjective:    Patient ID: Ryan [name], male    DOB: Nov 18, 1960, 57 y.o.   MRN: [id number]  HPI Here for follow up of pulmonary MAI infection He has cavitary lesions and a history of eye issues while on treatment previously that included rifampin an cipro. It was felt most likely to cipro and he was not able to continue treatment after that.  He was referred to me and I started him on daily three drug treatment for cavitary MAI with rifampin, ethambutol and azithromycin.  He is tolerating this well and no vision issues, no abdominal pain and overall feels better.  He was hospitalized once since I saw him with an acute exacerbation.    Review of Systems  Constitutional: Negative for chills, fatigue and fever.  Eyes: Negative for redness and visual disturbance.  Gastrointestinal: Negative for abdominal distention, abdominal pain, diarrhea and nausea.  Skin: Negative for rash.       Objective:   Physical Exam  Constitutional: He appears well-developed and well-nourished. No distress.  HENT:  Mouth/Throat: No oropharyngeal exudate.  Eyes: No scleral icterus.  Cardiovascular: Normal rate, regular rhythm and normal heart sounds.  No murmur heard. Pulmonary/Chest: Effort normal and breath sounds normal. No respiratory distress.  Skin: No rash noted.   SH: quit smoking in 2016       Assessment & Plan:

## 2017-12-30 NOTE — Assessment & Plan Note (Signed)
Currently his vision is good. I have advised he follow up with his ophthalmologist with his history while on ethambutol.  He will reschedule after missing due to hospitalization

## 2017-12-30 NOTE — Assessment & Plan Note (Signed)
Will check his LFTs and cbc today He can get this checked every 3 months

## 2017-12-30 NOTE — Assessment & Plan Note (Signed)
He is doing well with this treatment.  He will need treatment for over 1 year, at least 12 months after first negative sputum.  He can continue with daily treatment as long as tolerated, can consider 3 times per week after 6 months if needed.  If he needs additional treatment in the future, can consider inhaled liposomal amikacin if he has persistent positive sputums or refractory disease.   He can follow up here as needed.  He does have access to his medications with a support program.

## 2018-01-01 ENCOUNTER — Other Ambulatory Visit: Payer: Self-pay | Admitting: *Deleted

## 2018-01-01 MED ORDER — CARTIA XT 120 MG PO CP24: 120.0000 mg | ORAL_CAPSULE | Freq: Every day | ORAL | 0 refills | Status: AC

## 2018-01-05 DIAGNOSIS — E43 Unspecified severe protein-calorie malnutrition: Secondary | ICD-10-CM | POA: Diagnosis not present

## 2018-01-05 DIAGNOSIS — E785 Hyperlipidemia, unspecified: Secondary | ICD-10-CM | POA: Diagnosis not present

## 2018-01-05 DIAGNOSIS — Z9981 Dependence on supplemental oxygen: Secondary | ICD-10-CM | POA: Diagnosis not present

## 2018-01-05 DIAGNOSIS — J8410 Pulmonary fibrosis, unspecified: Secondary | ICD-10-CM | POA: Diagnosis not present

## 2018-01-05 DIAGNOSIS — J449 Chronic obstructive pulmonary disease, unspecified: Secondary | ICD-10-CM | POA: Diagnosis not present

## 2018-01-05 DIAGNOSIS — R600 Localized edema: Secondary | ICD-10-CM | POA: Diagnosis not present

## 2018-01-05 DIAGNOSIS — Z794 Long term (current) use of insulin: Secondary | ICD-10-CM | POA: Diagnosis not present

## 2018-01-05 DIAGNOSIS — D649 Anemia, unspecified: Secondary | ICD-10-CM | POA: Diagnosis not present

## 2018-01-05 DIAGNOSIS — E1165 Type 2 diabetes mellitus with hyperglycemia: Secondary | ICD-10-CM | POA: Diagnosis not present

## 2018-01-05 DIAGNOSIS — I480 Paroxysmal atrial fibrillation: Secondary | ICD-10-CM | POA: Diagnosis not present

## 2018-01-05 DIAGNOSIS — J9611 Chronic respiratory failure with hypoxia: Secondary | ICD-10-CM | POA: Diagnosis not present

## 2018-01-12 DIAGNOSIS — J9611 Chronic respiratory failure with hypoxia: Secondary | ICD-10-CM | POA: Diagnosis not present

## 2018-01-12 DIAGNOSIS — J449 Chronic obstructive pulmonary disease, unspecified: Secondary | ICD-10-CM | POA: Diagnosis not present

## 2018-01-12 DIAGNOSIS — E119 Type 2 diabetes mellitus without complications: Secondary | ICD-10-CM | POA: Diagnosis not present

## 2018-01-12 DIAGNOSIS — E785 Hyperlipidemia, unspecified: Secondary | ICD-10-CM | POA: Diagnosis not present

## 2018-01-14 DIAGNOSIS — D649 Anemia, unspecified: Secondary | ICD-10-CM | POA: Diagnosis not present

## 2018-01-14 DIAGNOSIS — J9611 Chronic respiratory failure with hypoxia: Secondary | ICD-10-CM | POA: Diagnosis not present

## 2018-01-14 DIAGNOSIS — E1165 Type 2 diabetes mellitus with hyperglycemia: Secondary | ICD-10-CM | POA: Diagnosis not present

## 2018-01-14 DIAGNOSIS — Z9981 Dependence on supplemental oxygen: Secondary | ICD-10-CM | POA: Diagnosis not present

## 2018-01-14 DIAGNOSIS — E785 Hyperlipidemia, unspecified: Secondary | ICD-10-CM | POA: Diagnosis not present

## 2018-01-14 DIAGNOSIS — Z794 Long term (current) use of insulin: Secondary | ICD-10-CM | POA: Diagnosis not present

## 2018-01-14 DIAGNOSIS — R600 Localized edema: Secondary | ICD-10-CM | POA: Diagnosis not present

## 2018-01-14 DIAGNOSIS — E43 Unspecified severe protein-calorie malnutrition: Secondary | ICD-10-CM | POA: Diagnosis not present

## 2018-01-14 DIAGNOSIS — J8410 Pulmonary fibrosis, unspecified: Secondary | ICD-10-CM | POA: Diagnosis not present

## 2018-01-14 DIAGNOSIS — J449 Chronic obstructive pulmonary disease, unspecified: Secondary | ICD-10-CM | POA: Diagnosis not present

## 2018-01-14 DIAGNOSIS — I480 Paroxysmal atrial fibrillation: Secondary | ICD-10-CM | POA: Diagnosis not present

## 2018-01-15 ENCOUNTER — Encounter: Payer: Self-pay | Admitting: Internal Medicine

## 2018-01-20 DIAGNOSIS — E785 Hyperlipidemia, unspecified: Secondary | ICD-10-CM | POA: Diagnosis not present

## 2018-01-20 DIAGNOSIS — R600 Localized edema: Secondary | ICD-10-CM | POA: Diagnosis not present

## 2018-01-20 DIAGNOSIS — J449 Chronic obstructive pulmonary disease, unspecified: Secondary | ICD-10-CM | POA: Diagnosis not present

## 2018-01-20 DIAGNOSIS — J9611 Chronic respiratory failure with hypoxia: Secondary | ICD-10-CM | POA: Diagnosis not present

## 2018-01-20 DIAGNOSIS — E43 Unspecified severe protein-calorie malnutrition: Secondary | ICD-10-CM | POA: Diagnosis not present

## 2018-01-20 DIAGNOSIS — E1165 Type 2 diabetes mellitus with hyperglycemia: Secondary | ICD-10-CM | POA: Diagnosis not present

## 2018-01-20 DIAGNOSIS — Z794 Long term (current) use of insulin: Secondary | ICD-10-CM | POA: Diagnosis not present

## 2018-01-20 DIAGNOSIS — I480 Paroxysmal atrial fibrillation: Secondary | ICD-10-CM | POA: Diagnosis not present

## 2018-01-20 DIAGNOSIS — J8410 Pulmonary fibrosis, unspecified: Secondary | ICD-10-CM | POA: Diagnosis not present

## 2018-01-20 DIAGNOSIS — Z9981 Dependence on supplemental oxygen: Secondary | ICD-10-CM | POA: Diagnosis not present

## 2018-01-20 DIAGNOSIS — D649 Anemia, unspecified: Secondary | ICD-10-CM | POA: Diagnosis not present

## 2018-01-21 DIAGNOSIS — J449 Chronic obstructive pulmonary disease, unspecified: Secondary | ICD-10-CM | POA: Diagnosis not present

## 2018-02-21 DIAGNOSIS — J449 Chronic obstructive pulmonary disease, unspecified: Secondary | ICD-10-CM | POA: Diagnosis not present

## 2018-02-24 DIAGNOSIS — M545 Low back pain: Secondary | ICD-10-CM | POA: Diagnosis not present

## 2018-02-24 DIAGNOSIS — I5032 Chronic diastolic (congestive) heart failure: Secondary | ICD-10-CM | POA: Diagnosis not present

## 2018-02-24 DIAGNOSIS — J449 Chronic obstructive pulmonary disease, unspecified: Secondary | ICD-10-CM | POA: Diagnosis not present

## 2018-02-24 DIAGNOSIS — J9611 Chronic respiratory failure with hypoxia: Secondary | ICD-10-CM | POA: Diagnosis not present

## 2018-03-15 IMAGING — DX DG CHEST 2V
2 series · 2 of 2 positions shown · non-contrast
Comparison: Chest x-ray 05/30/2016

CLINICAL DATA: Chest pain, shortness of Breath and lightheadedness.

EXAM:
CHEST  2 VIEW

[chest pa]
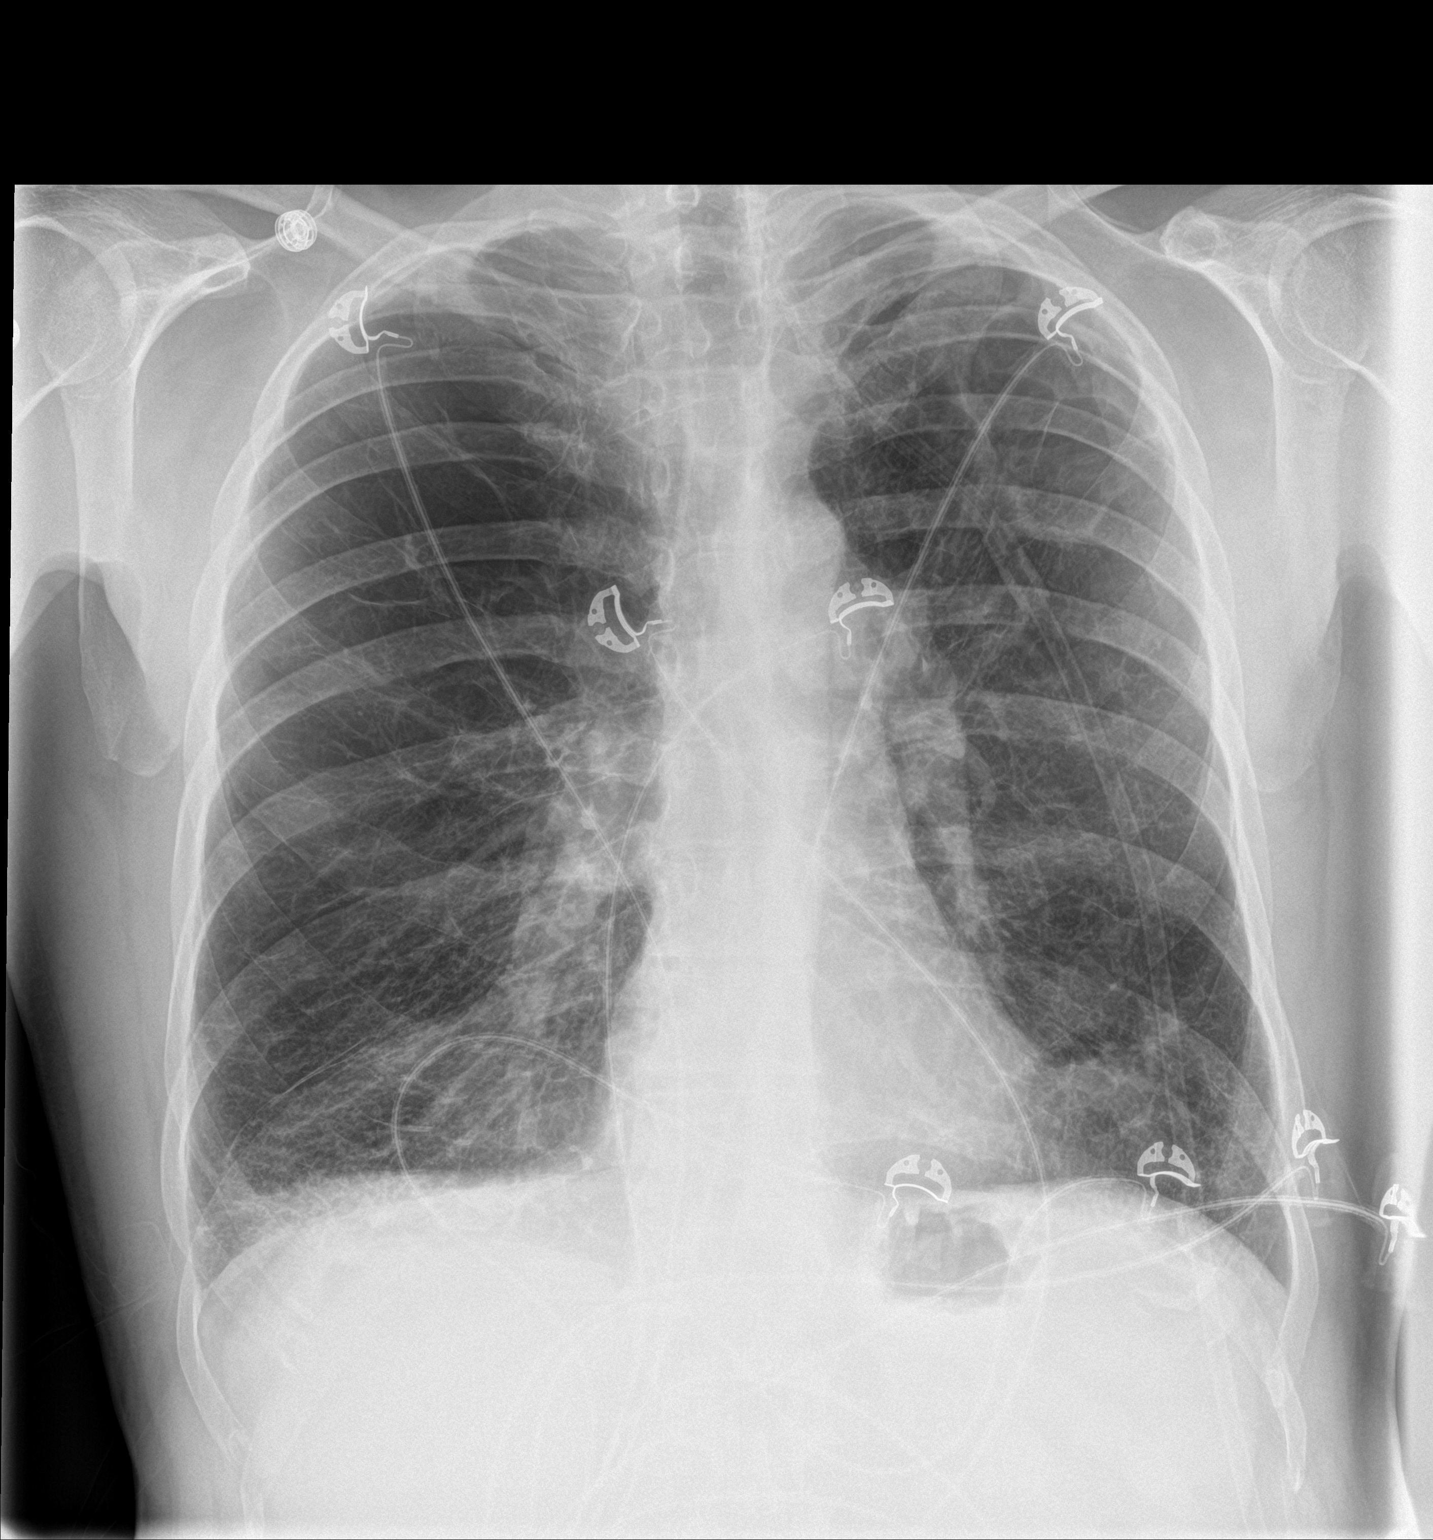

[chest lat]
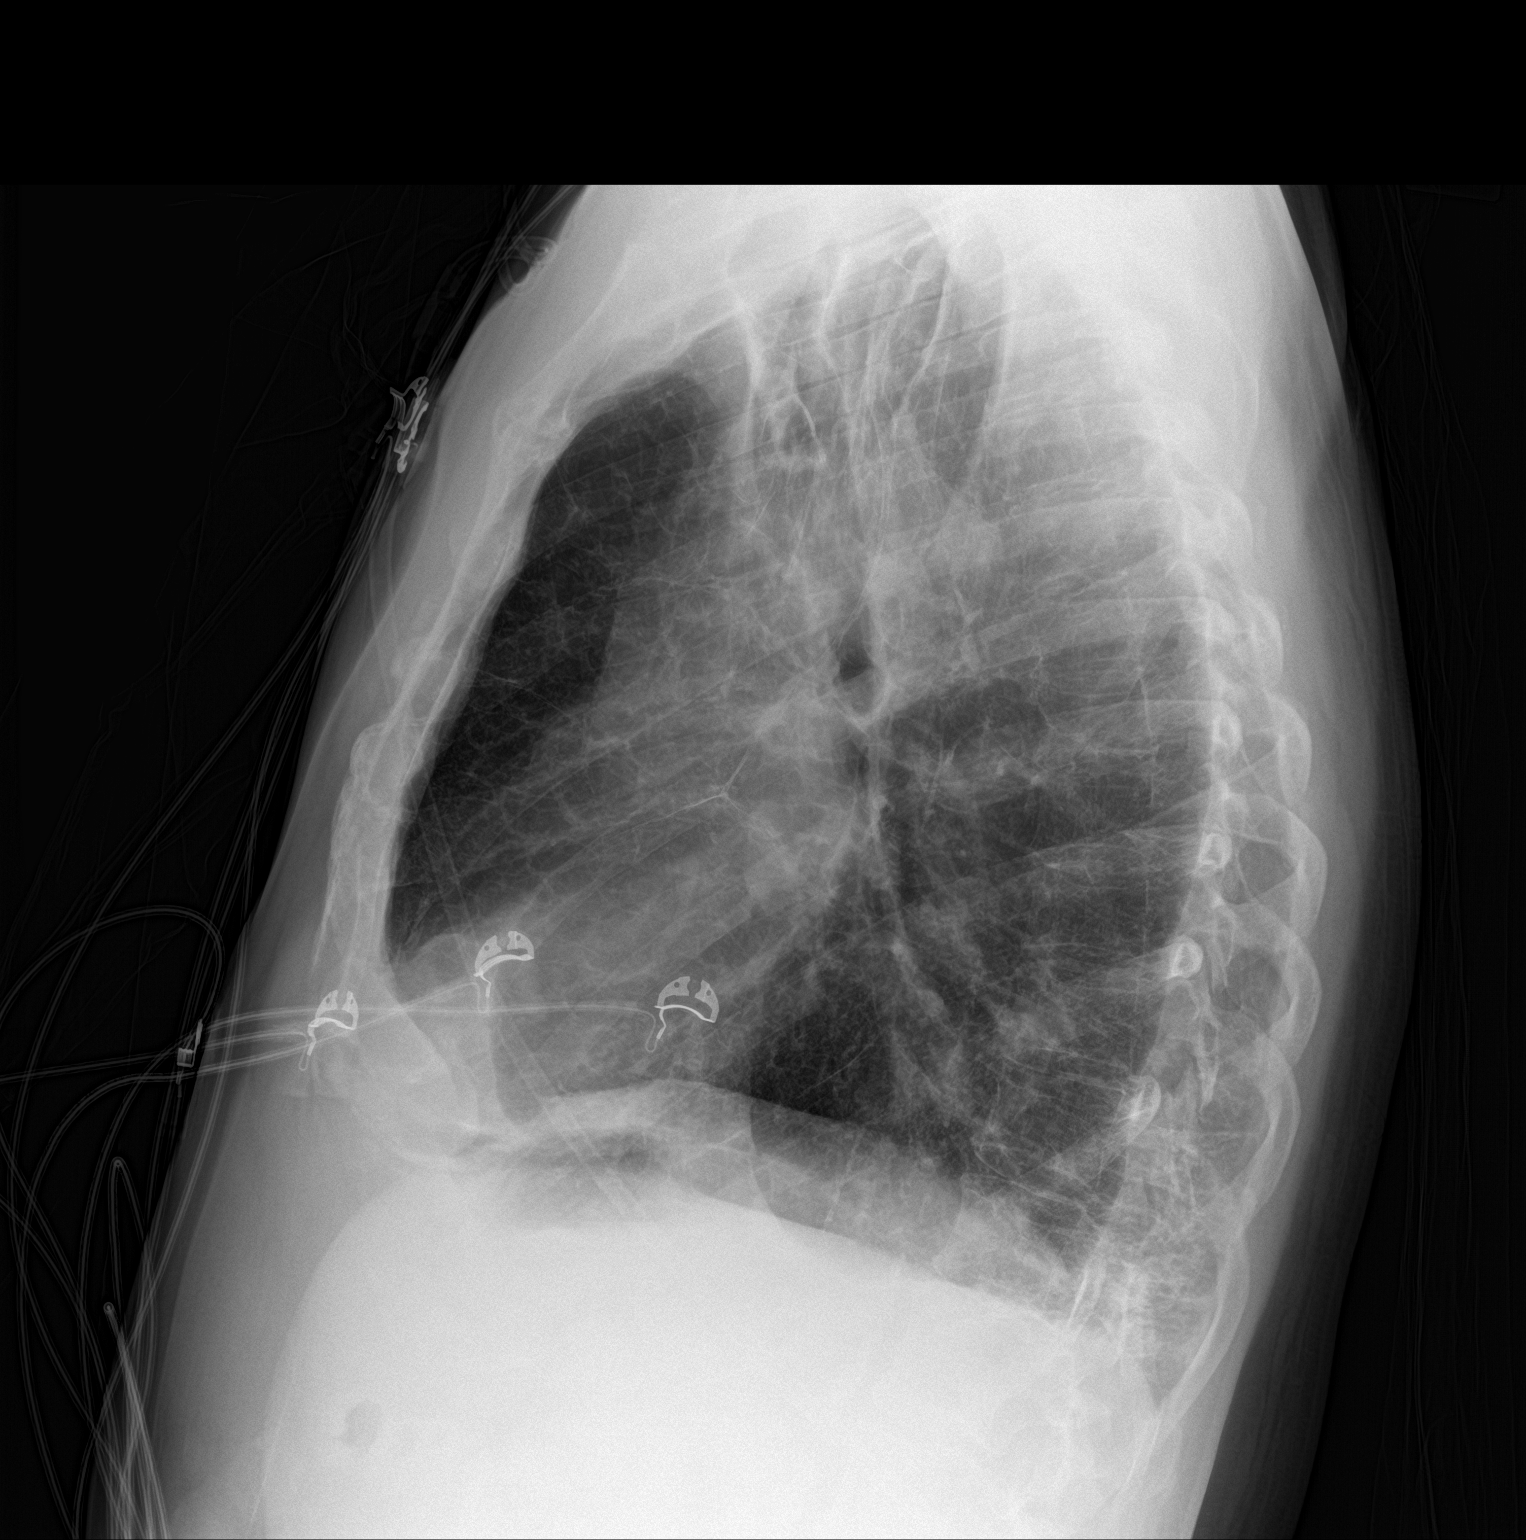

[2 of 2 positions shown; findings below may reference images not displayed]

FINDINGS: The cardiac silhouette, mediastinal and hilar contours are within
normal limits and stable. There are chronic emphysematous changes
and areas of pulmonary scarring. No definite acute overlying
pulmonary process. The bony thorax is intact.
IMPRESSION: Chronic lung changes.  No acute cardiopulmonary findings.

## 2018-03-24 DIAGNOSIS — J449 Chronic obstructive pulmonary disease, unspecified: Secondary | ICD-10-CM | POA: Diagnosis not present

## 2018-03-29 DIAGNOSIS — I5032 Chronic diastolic (congestive) heart failure: Secondary | ICD-10-CM | POA: Diagnosis not present

## 2018-03-29 DIAGNOSIS — A318 Other mycobacterial infections: Secondary | ICD-10-CM | POA: Diagnosis not present

## 2018-03-29 DIAGNOSIS — J449 Chronic obstructive pulmonary disease, unspecified: Secondary | ICD-10-CM | POA: Diagnosis not present

## 2018-03-29 DIAGNOSIS — I482 Chronic atrial fibrillation: Secondary | ICD-10-CM | POA: Diagnosis not present

## 2018-04-13 LAB — CBC WITH DIFFERENTIAL/PLATELET
BASOS ABS: 51 {cells}/uL (ref 0–200)
Basophils Relative: 0.8 %
EOS PCT: 3.9 %
Eosinophils Absolute: 250 cells/uL (ref 15–500)
HEMATOCRIT: 34.4 % — AB (ref 38.5–50.0)
Hemoglobin: 11.5 g/dL — ABNORMAL LOW (ref 13.2–17.1)
LYMPHS ABS: 2022 {cells}/uL (ref 850–3900)
MCH: 27.3 pg (ref 27.0–33.0)
MCHC: 33.4 g/dL (ref 32.0–36.0)
MCV: 81.7 fL (ref 80.0–100.0)
MONOS PCT: 12.9 %
MPV: 11.4 fL (ref 7.5–12.5)
NEUTROS PCT: 50.8 %
Neutro Abs: 3251 cells/uL (ref 1500–7800)
Platelets: 243 10*3/uL (ref 140–400)
RBC: 4.21 10*6/uL (ref 4.20–5.80)
RDW: 15.7 % — AB (ref 11.0–15.0)
TOTAL LYMPHOCYTE: 31.6 %
WBC mixed population: 826 cells/uL (ref 200–950)
WBC: 6.4 10*3/uL (ref 3.8–10.8)

## 2018-04-13 LAB — COMPLETE METABOLIC PANEL WITH GFR
AG Ratio: 1.3 (calc) (ref 1.0–2.5)
ALBUMIN MSPROF: 3.5 g/dL — AB (ref 3.6–5.1)
ALKALINE PHOSPHATASE (APISO): 58 U/L (ref 40–115)
ALT: 14 U/L (ref 9–46)
AST: 8 U/L — ABNORMAL LOW (ref 10–35)
BUN: 13 mg/dL (ref 7–25)
CHLORIDE: 95 mmol/L — AB (ref 98–110)
CO2: 37 mmol/L — ABNORMAL HIGH (ref 20–32)
Calcium: 9 mg/dL (ref 8.6–10.3)
Creat: 0.94 mg/dL (ref 0.70–1.33)
GFR, EST AFRICAN AMERICAN: 102 mL/min/{1.73_m2} (ref >=60)
GFR, Est Non African American: 88 mL/min/{1.73_m2} (ref >=60)
GLUCOSE: 264 mg/dL — AB (ref 65–99)
Globulin: 2.6 g/dL (calc) (ref 1.9–3.7)
Potassium: 3.4 mmol/L — ABNORMAL LOW (ref 3.5–5.3)
SODIUM: 140 mmol/L (ref 135–146)
TOTAL PROTEIN: 6.1 g/dL (ref 6.1–8.1)
Total Bilirubin: 0.2 mg/dL (ref 0.2–1.2)

## 2018-04-19 ENCOUNTER — Encounter (HOSPITAL_COMMUNITY): Payer: Self-pay | Admitting: Emergency Medicine

## 2018-04-19 ENCOUNTER — Emergency Department (HOSPITAL_COMMUNITY): Payer: Medicare Other

## 2018-04-19 ENCOUNTER — Other Ambulatory Visit: Payer: Self-pay

## 2018-04-19 ENCOUNTER — Inpatient Hospital Stay (HOSPITAL_COMMUNITY)
Admission: EM | Admit: 2018-04-19 | Discharge: 2018-04-25 | DRG: 190 | Disposition: A | Discharge status: Home / Self Care | Payer: Medicare Other | Attending: Pulmonary Disease | Admitting: Pulmonary Disease

## 2018-04-19 DIAGNOSIS — K222 Esophageal obstruction: Secondary | ICD-10-CM | POA: Diagnosis present

## 2018-04-19 DIAGNOSIS — J441 Chronic obstructive pulmonary disease with (acute) exacerbation: Principal | ICD-10-CM | POA: Diagnosis present

## 2018-04-19 DIAGNOSIS — I480 Paroxysmal atrial fibrillation: Secondary | ICD-10-CM | POA: Diagnosis not present

## 2018-04-19 DIAGNOSIS — M5134 Other intervertebral disc degeneration, thoracic region: Secondary | ICD-10-CM | POA: Diagnosis present

## 2018-04-19 DIAGNOSIS — R402253 Coma scale, best verbal response, oriented, at hospital admission: Secondary | ICD-10-CM | POA: Diagnosis present

## 2018-04-19 DIAGNOSIS — R402143 Coma scale, eyes open, spontaneous, at hospital admission: Secondary | ICD-10-CM | POA: Diagnosis present

## 2018-04-19 DIAGNOSIS — R109 Unspecified abdominal pain: Secondary | ICD-10-CM | POA: Diagnosis not present

## 2018-04-19 DIAGNOSIS — Z7982 Long term (current) use of aspirin: Secondary | ICD-10-CM

## 2018-04-19 DIAGNOSIS — D751 Secondary polycythemia: Secondary | ICD-10-CM | POA: Diagnosis not present

## 2018-04-19 DIAGNOSIS — Z79899 Other long term (current) drug therapy: Secondary | ICD-10-CM

## 2018-04-19 DIAGNOSIS — A310 Pulmonary mycobacterial infection: Secondary | ICD-10-CM | POA: Diagnosis present

## 2018-04-19 DIAGNOSIS — K219 Gastro-esophageal reflux disease without esophagitis: Secondary | ICD-10-CM | POA: Diagnosis not present

## 2018-04-19 DIAGNOSIS — M25512 Pain in left shoulder: Secondary | ICD-10-CM | POA: Diagnosis present

## 2018-04-19 DIAGNOSIS — Z8701 Personal history of pneumonia (recurrent): Secondary | ICD-10-CM

## 2018-04-19 DIAGNOSIS — Z23 Encounter for immunization: Secondary | ICD-10-CM | POA: Diagnosis not present

## 2018-04-19 DIAGNOSIS — J329 Chronic sinusitis, unspecified: Secondary | ICD-10-CM | POA: Diagnosis not present

## 2018-04-19 DIAGNOSIS — K2270 Barrett's esophagus without dysplasia: Secondary | ICD-10-CM | POA: Diagnosis present

## 2018-04-19 DIAGNOSIS — R402363 Coma scale, best motor response, obeys commands, at hospital admission: Secondary | ICD-10-CM | POA: Diagnosis not present

## 2018-04-19 DIAGNOSIS — J9621 Acute and chronic respiratory failure with hypoxia: Secondary | ICD-10-CM | POA: Diagnosis present

## 2018-04-19 DIAGNOSIS — R0602 Shortness of breath: Secondary | ICD-10-CM | POA: Diagnosis not present

## 2018-04-19 DIAGNOSIS — Z794 Long term (current) use of insulin: Secondary | ICD-10-CM

## 2018-04-19 DIAGNOSIS — E119 Type 2 diabetes mellitus without complications: Secondary | ICD-10-CM

## 2018-04-19 DIAGNOSIS — Z792 Long term (current) use of antibiotics: Secondary | ICD-10-CM

## 2018-04-19 DIAGNOSIS — M359 Systemic involvement of connective tissue, unspecified: Secondary | ICD-10-CM | POA: Diagnosis not present

## 2018-04-19 DIAGNOSIS — Z7951 Long term (current) use of inhaled steroids: Secondary | ICD-10-CM

## 2018-04-19 DIAGNOSIS — G8929 Other chronic pain: Secondary | ICD-10-CM | POA: Diagnosis present

## 2018-04-19 DIAGNOSIS — R079 Chest pain, unspecified: Secondary | ICD-10-CM | POA: Diagnosis not present

## 2018-04-19 DIAGNOSIS — E785 Hyperlipidemia, unspecified: Secondary | ICD-10-CM | POA: Diagnosis present

## 2018-04-19 DIAGNOSIS — M5030 Other cervical disc degeneration, unspecified cervical region: Secondary | ICD-10-CM | POA: Diagnosis not present

## 2018-04-19 DIAGNOSIS — F17210 Nicotine dependence, cigarettes, uncomplicated: Secondary | ICD-10-CM | POA: Diagnosis present

## 2018-04-19 DIAGNOSIS — I5033 Acute on chronic diastolic (congestive) heart failure: Secondary | ICD-10-CM | POA: Diagnosis present

## 2018-04-19 DIAGNOSIS — Z8669 Personal history of other diseases of the nervous system and sense organs: Secondary | ICD-10-CM

## 2018-04-19 DIAGNOSIS — E1165 Type 2 diabetes mellitus with hyperglycemia: Secondary | ICD-10-CM | POA: Diagnosis present

## 2018-04-19 DIAGNOSIS — I2510 Atherosclerotic heart disease of native coronary artery without angina pectoris: Secondary | ICD-10-CM | POA: Diagnosis present

## 2018-04-19 DIAGNOSIS — M549 Dorsalgia, unspecified: Secondary | ICD-10-CM | POA: Diagnosis not present

## 2018-04-19 DIAGNOSIS — Z881 Allergy status to other antibiotic agents status: Secondary | ICD-10-CM

## 2018-04-19 DIAGNOSIS — Z7952 Long term (current) use of systemic steroids: Secondary | ICD-10-CM

## 2018-04-19 DIAGNOSIS — R0600 Dyspnea, unspecified: Secondary | ICD-10-CM

## 2018-04-19 DIAGNOSIS — Z887 Allergy status to serum and vaccine status: Secondary | ICD-10-CM

## 2018-04-19 DIAGNOSIS — Z888 Allergy status to other drugs, medicaments and biological substances status: Secondary | ICD-10-CM

## 2018-04-19 LAB — CBC
HEMATOCRIT: 37.9 % — AB (ref 39.0–52.0)
HEMOGLOBIN: 12.5 g/dL — AB (ref 13.0–17.0)
MCH: 27.8 pg (ref 26.0–34.0)
MCHC: 33 g/dL (ref 30.0–36.0)
MCV: 84.2 fL (ref 78.0–100.0)
Platelets: 191 10*3/uL (ref 150–400)
RBC: 4.5 MIL/uL (ref 4.22–5.81)
RDW: 13.9 % (ref 11.5–15.5)
WBC: 6.7 10*3/uL (ref 4.0–10.5)

## 2018-04-19 LAB — BASIC METABOLIC PANEL
Anion gap: 11 (ref 5–15)
BUN: 18 mg/dL (ref 6–20)
CHLORIDE: 96 mmol/L — AB (ref 98–111)
CO2: 30 mmol/L (ref 22–32)
CREATININE: 0.95 mg/dL (ref 0.61–1.24)
Calcium: 8.7 mg/dL — ABNORMAL LOW (ref 8.9–10.3)
GFR calc Af Amer: >60 mL/min (ref >60)
GFR calc non Af Amer: >60 mL/min (ref >60)
Glucose, Bld: 216 mg/dL — ABNORMAL HIGH (ref 70–99)
POTASSIUM: 3.5 mmol/L (ref 3.5–5.1)
SODIUM: 137 mmol/L (ref 135–145)

## 2018-04-19 LAB — CBG MONITORING, ED: Glucose-Capillary: 240 mg/dL — ABNORMAL HIGH (ref 70–99)

## 2018-04-19 LAB — BRAIN NATRIURETIC PEPTIDE: B NATRIURETIC PEPTIDE 5: 17 pg/mL (ref 0.0–100.0)

## 2018-04-19 MED ORDER — SODIUM CHLORIDE 0.9 % IN NEBU
INHALATION_SOLUTION | RESPIRATORY_TRACT | Status: AC
  Filled 2018-04-19: qty 3

## 2018-04-19 MED ORDER — SODIUM CHLORIDE 0.9 % IN NEBU
INHALATION_SOLUTION | RESPIRATORY_TRACT | Status: AC
  Administered 2018-04-19: 3 mL
  Filled 2018-04-19: qty 3

## 2018-04-19 MED ORDER — ACETAMINOPHEN 325 MG PO TABS
650.0000 mg | ORAL_TABLET | Freq: Once | ORAL | Status: AC
  Administered 2018-04-19: 650 mg via ORAL
  Filled 2018-04-19: qty 2

## 2018-04-19 MED ORDER — PREDNISONE 20 MG PO TABS
20.0000 mg | ORAL_TABLET | Freq: Once | ORAL | Status: AC
  Administered 2018-04-19: 20 mg via ORAL
  Filled 2018-04-19: qty 1

## 2018-04-19 MED ORDER — LEVALBUTEROL HCL 1.25 MG/0.5ML IN NEBU
1.2500 mg | INHALATION_SOLUTION | Freq: Once | RESPIRATORY_TRACT | Status: AC
  Administered 2018-04-19: 1.25 mg via RESPIRATORY_TRACT
  Filled 2018-04-19: qty 0.5

## 2018-04-19 NOTE — ED Provider Notes (Addendum)
[facility] EMERGENCY DEPARTMENT Provider Note   CSN: [id number] Arrival date & time: 04/19/18  2103     History   Chief Complaint Chief Complaint  Patient presents with  . Shortness of Breath    HPI Ryan [name] is a 57 y.o. male.  HPI Complains of shortness of breath for several years worse over the approximate the past 2 days.  He complains of cough productive of brownish sputum for several years, unchanged.  He denies any fever.  He is been treating himself with Xopenex at home with partial relief.  He is also on prednisone 10 mg daily.  Also reports facial congestion for the past few days.  No other associated symptoms.  Nothing makes symptoms better or worse.  Symptoms feel like "emphysema" he is had in the past Past Medical History:  Diagnosis Date  . Atrial fibrillation ([place])    Not anticoagulated  . Barrett's esophagus   . Borderline diabetes   . Bronchitis   . CAP (community acquired pneumonia) 07/10/2013   04/2015  . 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 Chronic back pain   . Chronic left shoulder pain   . Chronic neck pain   . Chronic respiratory failure ([place])   . Collagen vascular disease ([place])   . COPD (chronic obstructive pulmonary disease) ([place])   . Coronary atherosclerosis of native coronary artery    Mild nonobstructive disease at catheterization 2007  . DDD (degenerative disc disease)    Cervical and thoracic  . GERD (gastroesophageal reflux disease)   . History of pneumonia   . Hypercholesteremia   . Polycythemia   . Seizures ([place])    Last seizure 2 yrs ago  . Smoker   . Type 2 diabetes mellitus ([place])   . Type 2 diabetes mellitus without complication [facility])     Patient Active Problem List   Diagnosis Date Noted  . Medication monitoring encounter 12/30/2017  . Vision disorder 12/30/2017  . Umbilical hernia 34/74/2595  . Acute on chronic diastolic heart failure ([place]) 10/24/2017  . COPD with acute  exacerbation ([place])   . Acute respiratory failure with hypoxia ([place]) 10/17/2017  . Lower extremity edema 10/17/2017  . Influenza B 09/21/2017  . MAI (mycobacterium avium-intracellulare) ([place]) 08/29/2015  . Edema   . Anemia of chronic disease   . Hypokalemia 08/22/2015  . Elevated liver enzymes   . Absolute anemia   . COPD (chronic obstructive pulmonary disease) ([place]) 08/17/2015  . Sepsis ([place]) 07/26/2015  . Lobar pneumonia ([place]) 07/26/2015  . Pulmonary fibrosis ([place]) 05/10/2015  . Chronic respiratory failure with hypoxia ([place]) 05/10/2015  . Chronic atrial fibrillation   . Foot pain, bilateral   . Pulmonary nodule   . Pain, generalized 05/01/2015  . Laceration 05/01/2015  . Paroxysmal atrial fibrillation (HCC)   . Chronic respiratory failure ([place])   . Hyperglycemia 09/08/2014  . A-fib ([place]) 09/08/2014  . Diabetes ([place]) 05/29/2014  . Protein-calorie malnutrition, severe ([place]) 10/21/2013  . Hemoptysis 10/20/2013  . Atrial fibrillation with rapid ventricular response ([place]) 07/10/2013  . Shoulder pain, left 01/20/2012  . Rotator cuff syndrome of left shoulder 01/20/2012  . Spondylosis, cervical 11/19/2011  . Shoulder pain 11/19/2011  . 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 Cavitary lesion of lung 05/07/2011  . GERD with stricture   . ASCVD (arteriosclerotic cardiovascular disease)   . Type 2 diabetes mellitus without complication ([place])   . TOBACCO ABUSE 05/17/2010  .  Asthma 01/20/2008  . Hyperlipidemia 12/27/2007  . 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 COLONOSCOPY  2012   Dr. Posey Pronto: normal  . ESOPHAGOGASTRODUODENOSCOPY N/A 05/04/2015   Dr. Michail Sermon: minimal erosive esophagitis, small hiatal hernia. FOOD PRECLUDED A COMPLETE EXAM. No biopsies taken  . ESOPHAGOGASTRODUODENOSCOPY  2011   [place]: Barrett's   . LUNG BIOPSY    . Throat biopsy    . VASECTOMY    . VASECTOMY  1987          Home Medications    Prior to Admission medications   Medication Sig Start Date End Date Taking? Authorizing Provider  albuterol (VENTOLIN HFA) 108 (90 BASE) MCG/ACT inhaler Inhale 2 puffs into the lungs every 6 (six) hours as needed for wheezing or shortness of breath.    [provider]  azithromycin (ZITHROMAX) 500 MG tablet Take 1 tablet (500 mg total) by mouth daily. 11/17/17   Comer, Okey Regal, MD  baclofen (LIORESAL) 20 MG tablet Take 20 mg by mouth 3 (three) times daily.  06/05/15   [provider]  benzonatate (TESSALON) 200 MG capsule Take 200 mg by mouth 3 (three) times daily.    [provider]  budesonide (PULMICORT) 0.25 MG/2ML nebulizer solution Take 2 mLs (0.25 mg total) by nebulization 2 (two) times daily. 10/24/17   [name], MD  CARTIA XT 120 MG 24 hr capsule Take 1 capsule (120 mg total) by mouth daily. 01/01/18   Arnoldo Lenis, MD  ethambutol (MYAMBUTOL) 400 MG tablet Take 3 tablets (1,200 mg total) by mouth daily. 11/17/17   Thayer Headings, MD  Fluticasone-Umeclidin-Vilant (TRELEGY ELLIPTA) 100-62.5-25 MCG/INH AEPB Inhale 1 puff into the lungs daily. Rinse mouth after you use this 09/23/17   [name], MD  furosemide (LASIX) 40 MG tablet Take 1 tablet (40 mg total) by mouth 2 (two) times daily. 10/24/17   [name], MD  guaifenesin (HUMIBID E) 400 MG TABS tablet Take 400 mg by mouth every 4 (four) hours.    [provider]  HUMALOG KWIKPEN 100 UNIT/ML KiwkPen Sliding scale 09/24/17   [provider]  insulin glargine (LANTUS) 100 UNIT/ML injection Inject 0.24 mLs (24 Units total) into the skin daily. 24 units daily 10/24/17   [name], MD  insulin lispro (HUMALOG) 100 UNIT/ML injection Take by sliding scale provided by hospital maximum amount daily is 60 units.  Check blood sugar 4 times a day 09/23/17   [name], MD  ipratropium (ATROVENT) 0.02 % nebulizer solution VVN TID WITH LEVALBUTEROL  09/29/17   [provider]  levalbuterol Penne Lash) 0.63 MG/3ML nebulizer solution Take 3 mLs (0.63 mg total) by nebulization every 8 (eight) hours as needed for wheezing or shortness of breath. 09/05/15   [name], MD  LORazepam (ATIVAN) 0.5 MG tablet Take 1 tablet (0.5 mg total) by mouth 2 (two) times daily as needed for anxiety or sleep. Patient taking differently: Take 0.5 mg by mouth 4 (four) times daily as needed for anxiety or sleep.  05/04/15   Cristal Ford, DO  metFORMIN (GLUCOPHAGE) 500 MG tablet Take 1 tablet (500 mg total) by mouth daily with breakfast. 09/15/14   Rexene Alberts, MD  montelukast (SINGULAIR) 10 MG tablet Take 10 mg by mouth at bedtime.    [provider]  nitroGLYCERIN (NITROSTAT) 0.4 MG SL tablet Place 1 tablet (0.4 mg total) under the tongue every 5 (  five) minutes as needed for chest pain. 04/22/13   Lendon Colonel, NP  omeprazole (PRILOSEC) 20 MG capsule Take 20 mg by mouth daily.    [provider]  oxyCODONE-acetaminophen (PERCOCET) 10-325 MG tablet Take 1 tablet by mouth every 4 (four) hours as needed for pain.    [provider]  predniSONE (DELTASONE) 10 MG tablet Take 2 daily for 1 week then 1 daily Patient taking differently: Take 10 mg by mouth daily with breakfast. Take 2 daily for 1 week then 1 daily 10/24/17   [name], MD  rifampin (RIFADIN) 300 MG capsule Take 2 capsules (600 mg total) by mouth daily. 11/17/17   Comer, Okey Regal, MD    Family History Family History  Problem Relation Age of Onset  . Hypertension Mother   . Diabetes Mother   . Heart attack Mother   . Heart failure Mother   . Hypertension Sister   . Diabetes Sister   . Heart failure Sister   . Colon cancer Neg Hx     Social History Social History   Tobacco Use  . Smoking status: Current Every Day Smoker    Packs/day: 0.50    Years: 45.00    Pack years: 22.50    Types: Cigarettes    Last attempt to quit: 05/01/2015    Years  since quitting: 2.9  . Smokeless tobacco: Never Used  Substance Use Topics  . Alcohol use: No    Alcohol/week: 0.0 standard drinks  . Drug use: No     Allergies   Albuterol; Ciprofloxacin; and Influenza vaccine live   Review of Systems Review of Systems  HENT: Positive for sinus pressure.   Respiratory: Positive for cough and shortness of breath.   Cardiovascular: Positive for chest pain.       C/o pain at right anterior chest with cough only  Allergic/Immunologic: Positive for immunocompromised state.       Diabetic  All other systems reviewed and are negative.    Physical Exam Updated Vital Signs BP (!) 145/81 (BP Location: Right Arm)   Pulse 90   Temp 98.1 F (36.7 C) (Oral)   Resp 18   Ht 5\' 8"  (1.727 m)   Wt 69.9 kg   SpO2 97%   BMI 23.42 kg/m   Physical Exam  Constitutional: He appears well-developed and well-nourished.  HENT:  Head: Normocephalic and atraumatic.  Eyes: Pupils are equal, round, and reactive to light. Conjunctivae are normal.  Neck: Neck supple. No tracheal deviation present. No thyromegaly present.  Cardiovascular: Normal rate and regular rhythm.  No murmur heard. Pulmonary/Chest: Effort normal and breath sounds normal. No respiratory distress.  Speaks in paragraphs. diffuse rhonchi  Abdominal: Soft. Bowel sounds are normal. He exhibits no distension. There is no tenderness.  Musculoskeletal: Normal range of motion. He exhibits no edema or tenderness.  Neurological: He is alert. Coordination normal.  Skin: Skin is warm and dry. No rash noted.  Psychiatric: He has a normal mood and affect.  Nursing note and vitals reviewed.    ED Treatments / Results  Labs (all labs ordered are listed, but only abnormal results are displayed) Labs Reviewed  CBG MONITORING, ED - Abnormal; Notable for the following components:      Result Value   Glucose-Capillary 240 (*)    All other components within normal limits  CBC  BASIC METABOLIC PANEL    BRAIN NATRIURETIC PEPTIDE   Lab work consistent with hyperglycemia EKG None ED ECG REPORT  Date: 04/19/2018  Rate: 80  Rhythm: normal sinus rhythm  QRS Axis: normal  Intervals: normal  ST/T Wave abnormalities: nonspecific T wave changes  Conduction Disutrbances:none  Narrative Interpretation:   Old EKG Reviewed: unchanged  I have personally reviewed the EKG tracing and disagree with the computerized printout as noted.  Rhythm is normal sinus, not accelerated junctional Radiology No results found.  Procedures Procedures (including critical care time)  Medications Ordered in ED Medications - No data to display  Chest x-ray viewed by me Results for orders placed or performed during the hospital encounter of 04/19/18  CBC  Result Value Ref Range   WBC 6.7 4.0 - 10.5 K/uL   RBC 4.50 4.22 - 5.81 MIL/uL   Hemoglobin 12.5 (L) 13.0 - 17.0 g/dL   HCT 37.9 (L) 39.0 - 52.0 %   MCV 84.2 78.0 - 100.0 fL   MCH 27.8 26.0 - 34.0 pg   MCHC 33.0 30.0 - 36.0 g/dL   RDW 13.9 11.5 - 15.5 %   Platelets 191 150 - 400 K/uL  CBG monitoring, ED  Result Value Ref Range   Glucose-Capillary 240 (H) 70 - 99 mg/dL   Comment 1 Notify RN    Dg Chest 2 View  Result Date: 04/19/2018 CLINICAL DATA:  Shortness of breath EXAM: CHEST - 2 VIEW COMPARISON:  Nov 27, 2017 FINDINGS: There are multiple bullae, most notably in the right upper lobe. There are multiple areas of scarring. There is no edema or consolidation. The heart size is normal. Pulmonary vascularity reflects the underlying bullous disease in the upper lobes. No adenopathy. There is evidence of an old fracture of the anterior left fifth rib. 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 No edema or consolidation. Stable cardiac silhouette. No evident adenopathy. Emphysema (ICD10-J43.9). Electronically Signed   By: Lowella Grip III M.D.   On: 04/19/2018 21:39   Initial Impression / Assessment and Plan / ED Course  I  have reviewed the triage vital signs and the nursing notes.  Pertinent labs & imaging results that were available during my care of the patient were reviewed by me and considered in my medical decision making (see chart for details).     10:10 PM patient states breathing is not improved after Xopenex nebulized treatment.  Additional Xopenex nebulizer treatment ordered by me as well as oral prednisone.. Pt signed out to Dr,.Mesner 1015 p.m. Who will check lab work and re-evalaute pt  Final Clinical Impressions(s) / ED Diagnoses  dx #1 COPD exacerbation 2 hyperglycemia Final diagnoses:  None    ED Discharge Orders    None       Orlie Dakin, MD 04/19/18 2222    Orlie Dakin, MD 04/19/18 2223 Addendum: requesting pain mediation for chest pain with cough. Tylenol ordered   Orlie Dakin, MD 04/19/18 2229

## 2018-04-19 NOTE — ED Provider Notes (Signed)
10:16 PM Assumed care from Dr. Winfred Leeds, please see their note for full history, physical and decision making until this point. In brief this is a 57 y.o. year old male who presented to the ED tonight with Shortness of Breath     Chronic COPD with 6L [place] requirement. Appears well. Getting breathing treatments.   On reevaluation patient states that he does not feel much better.  He states that some of the difficulty breathing is in his upper airway but he states that he feels like his right lung is not getting air either.  On examination he still tachypneic with significant diminished breath sounds and mild wheezing.  Shared decision making instituted and patient states he is scared he can get much worse as he is gotten significantly worse over the last few days.  This with his diminished breath sounds will plan for hospital admission  Labs, studies and imaging reviewed by myself and considered in medical decision making if ordered. Imaging interpreted by radiology.  Labs Reviewed  CBC - Abnormal; Notable for the following components:      Result Value   Hemoglobin 12.5 (*)    HCT 37.9 (*)    All other components within normal limits  CBG MONITORING, ED - Abnormal; Notable for the following components:   Glucose-Capillary 240 (*)    All other components within normal limits  BASIC METABOLIC PANEL  BRAIN NATRIURETIC PEPTIDE    DG Chest 2 View  Final Result      No follow-ups on file.    Merrily Pew, MD 04/20/18 [phone]

## 2018-04-19 NOTE — H&P (Signed)
History and Physical    Ryan [name] WJX:[id number] DOB: [birth year] DOA: 04/19/2018  PCP: [name], MD   Patient coming from: Home.  I have personally briefly reviewed patient's old medical records in [place]  Chief Complaint: Shortness of breath.  HPI: Ryan [name] is a 57 y.o. 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  He denies headache, sore throat, hemoptysis, abdominal pain, nausea, emesis, diarrhea, constipation, melena or hematochezia.  No dysuria, frequency or hematuria.  Denies polyuria, polydipsia, polyphagia or blurred vision.  No heat or cold intolerance.  ED Course: Initial vital signs temperature 98.1 F, pulse 90, respirations 18, blood pressure 145/81 mmHg and O2 sat 97% on room air.  The patient received supplemental oxygen, Xopenex 1.25 mg neb x2 and prednisone 20 mg p.o. x1 dose.  His white count 6.7, hemoglobin 12.5 g/dL and platelets 191.  Sodium 137, potassium 3.5, chloride was 96 and CO2 30 mmol/L.  Renal function was normal.  Phosphorus 4.1, magnesium 2.4, glucose was 216 and calcium 8.7 mg/dL.  BNP was 17.0 pg/mL.  His chest radiograph shows bullous emphysematous change with areas of scaring.  There was no edema or consolidation.  Stable  cardiac silhouette.  No adenopathy.  Please see images and full radiology report for further detail.  Review of Systems: As per HPI otherwise 10 point review of systems negative.   Past Medical History:  Diagnosis Date  . Atrial fibrillation ([place])    Not anticoagulated  . Barrett's esophagus   . Borderline diabetes   . Bronchitis   . CAP (community acquired pneumonia) 07/10/2013   04/2015  . 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 Chronic back pain   . Chronic left shoulder pain   . Chronic neck pain   . Chronic respiratory failure ([place])   . Collagen vascular disease ([place])   . COPD (chronic obstructive pulmonary disease) ([place])   . Coronary atherosclerosis of native coronary artery    Mild nonobstructive disease at catheterization 2007  . DDD (degenerative disc disease)    Cervical and thoracic  . GERD (gastroesophageal reflux disease)   . History of pneumonia   . Hypercholesteremia   . Polycythemia   . Seizures ([place])    Last seizure 2 yrs ago  . Smoker   . Type 2 diabetes mellitus ([place])   . Type 2 diabetes mellitus without complication [facility]     Past Surgical History:  Procedure Laterality Date  . COLONOSCOPY  2012   Dr. Posey Pronto: normal  . ESOPHAGOGASTRODUODENOSCOPY N/A 05/04/2015   Dr. Michail Sermon: minimal erosive esophagitis, small hiatal hernia. FOOD PRECLUDED A COMPLETE EXAM. No biopsies taken  . ESOPHAGOGASTRODUODENOSCOPY  2011   [place]: Barrett's   . LUNG BIOPSY    . Throat biopsy    . VASECTOMY    . VASECTOMY  1987     reports that he has been  smoking cigarettes. He has a 22.50 pack-year smoking history. He has never used smokeless tobacco. He reports that he does not drink alcohol or use drugs.  Allergies  Allergen Reactions  . Albuterol Palpitations  . 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 Influenza Vaccine Live Swelling    Family History  Problem  Relation Age of Onset  . Hypertension Mother   . Diabetes Mother   . Heart attack Mother   . Heart failure Mother   . Hypertension Sister   . Diabetes Sister   . Heart failure Sister   . Colon cancer Neg Hx    Prior to Admission medications   Medication Sig Start Date End Date Taking? Authorizing Provider  albuterol (VENTOLIN HFA) 108 (90 BASE) MCG/ACT inhaler Inhale 2 puffs into the lungs every 6 (six) hours as needed for wheezing or shortness of breath.    [provider]  azithromycin (ZITHROMAX) 500 MG tablet Take 1 tablet (500 mg total) by mouth daily. 11/17/17   Comer, Okey Regal, MD  baclofen (LIORESAL) 20 MG tablet Take 20 mg by mouth 3 (three) times daily.  06/05/15   [provider]  benzonatate (TESSALON) 200 MG capsule Take 200 mg by mouth 3 (three) times daily.    [provider]  budesonide (PULMICORT) 0.25 MG/2ML nebulizer solution Take 2 mLs (0.25 mg total) by nebulization 2 (two) times daily. 10/24/17   [name], MD  CARTIA XT 120 MG 24 hr capsule Take 1 capsule (120 mg total) by mouth daily. 01/01/18   Arnoldo Lenis, MD  ethambutol (MYAMBUTOL) 400 MG tablet Take 3 tablets (1,200 mg total) by mouth daily. 11/17/17   Thayer Headings, MD  Fluticasone-Umeclidin-Vilant (TRELEGY ELLIPTA) 100-62.5-25 MCG/INH AEPB Inhale 1 puff into the lungs daily. Rinse mouth after you use this 09/23/17   [name], MD  furosemide (LASIX) 40 MG tablet Take 1 tablet (40 mg total) by mouth 2 (two) times daily. 10/24/17   [name], MD  guaifenesin (HUMIBID E) 400 MG TABS tablet Take 400 mg by mouth every 4 (four) hours.    [provider]  HUMALOG KWIKPEN 100 UNIT/ML KiwkPen Sliding scale 09/24/17   [provider]  insulin glargine (LANTUS) 100 UNIT/ML injection Inject 0.24 mLs (24 Units total) into the skin daily. 24 units daily 10/24/17   [name], MD  insulin lispro (HUMALOG) 100 UNIT/ML injection Take by sliding scale provided by  hospital maximum amount daily is 60 units.  Check blood sugar 4 times a day 09/23/17   [name], MD  ipratropium (ATROVENT) 0.02 % nebulizer solution VVN TID WITH LEVALBUTEROL 09/29/17   [provider]  levalbuterol Penne Lash) 0.63 MG/3ML nebulizer solution Take 3 mLs (0.63 mg total) by nebulization every 8 (eight) hours as needed for wheezing or shortness of breath. 09/05/15   [name], MD  LORazepam (ATIVAN) 0.5 MG tablet Take 1 tablet (0.5 mg total) by mouth 2 (two) times daily as needed for anxiety or sleep. Patient taking differently: Take 0.5 mg by mouth 4 (four) times daily as needed for anxiety or sleep.  05/04/15   Cristal Ford, DO  metFORMIN (GLUCOPHAGE) 500 MG tablet Take 1 tablet (500 mg total) by mouth daily with breakfast. 09/15/14   Rexene Alberts, MD  montelukast (SINGULAIR) 10 MG tablet Take 10 mg by mouth at bedtime.    [provider]  nitroGLYCERIN (NITROSTAT) 0.4 MG SL tablet Place 1  tablet (0.4 mg total) under the tongue every 5 (five) minutes as needed for chest pain. 04/22/13   Lendon Colonel, NP  omeprazole (PRILOSEC) 20 MG capsule Take 20 mg by mouth daily.    [provider]  oxyCODONE-acetaminophen (PERCOCET) 10-325 MG tablet Take 1 tablet by mouth every 4 (four) hours as needed for pain.    [provider]  predniSONE (DELTASONE) 10 MG tablet Take 2 daily for 1 week then 1 daily Patient taking differently: Take 10 mg by mouth daily with breakfast. Take 2 daily for 1 week then 1 daily 10/24/17   [name], MD  rifampin (RIFADIN) 300 MG capsule Take 2 capsules (600 mg total) by mouth daily. 11/17/17   Thayer Headings, MD    Physical Exam: Vitals:   04/19/18 2139 04/19/18 2147 04/19/18 2200 04/19/18 2230  BP: 139/76  112/75 (!) 143/80  Pulse: 78  82 81  Resp: 16  14 17   Temp:      TempSrc:      SpO2: 99% 99% 100% 99%  Weight:      Height:        Constitutional: NAD, calm, comfortable Eyes: PERRL, lids and  conjunctivae normal ENMT: Mucous membranes are moist. Posterior pharynx clear of any exudate or lesions. Neck: normal, supple, no masses, no thyromegaly Respiratory: Bilateral decrease in breath sounds with rhonchi and wheezing, no crackles. Normal respiratory effort. No accessory muscle use.  Cardiovascular: Regular rate and rhythm, no murmurs / rubs / gallops. No extremity edema. 2+ pedal pulses. No carotid bruits.  Abdomen: no tenderness, no masses palpated. No hepatosplenomegaly. Bowel sounds positive.  Musculoskeletal: Positive clubbing, no cyanosis.  Good ROM, no contractures. Normal muscle tone.  Skin: no rashes, lesions, ulcers. No induration on limited dermatological examination. Neurologic: CN 2-12 grossly intact. Sensation intact, DTR normal. Strength 5/5 in all 4.  Psychiatric: Normal judgment and insight. Alert and oriented x 3. Normal mood.   Labs on Admission: I have personally reviewed following labs and imaging studies  CBC: Recent Labs  Lab 04/19/18 2153  WBC 6.7  HGB 12.5*  HCT 37.9*  MCV 84.2  PLT 338   Basic Metabolic Panel: Recent Labs  Lab 04/19/18 2153  NA 137  K 3.5  CL 96*  CO2 30  GLUCOSE 216*  BUN 18  CREATININE 0.95  CALCIUM 8.7*   GFR: Estimated Creatinine Clearance: 81 mL/min (by C-G formula based on SCr of 0.95 mg/dL). Liver Function Tests: No results for input(s): AST, ALT, ALKPHOS, BILITOT, PROT, ALBUMIN in the last 168 hours. No results for input(s): LIPASE, AMYLASE in the last 168 hours. No results for input(s): AMMONIA in the last 168 hours. Coagulation Profile: No results for input(s): INR, PROTIME in the last 168 hours. Cardiac Enzymes: No results for input(s): CKTOTAL, CKMB, CKMBINDEX, TROPONINI in the last 168 hours. BNP (last 3 results) No results for input(s): PROBNP in the last 8760 hours. HbA1C: No results for input(s): HGBA1C in the last 72 hours. CBG: Recent Labs  Lab 04/19/18 2112  GLUCAP 240*   Lipid Profile: No  results for input(s): CHOL, HDL, LDLCALC, TRIG, CHOLHDL, LDLDIRECT in the last 72 hours. Thyroid Function Tests: No results for input(s): TSH, T4TOTAL, FREET4, T3FREE, THYROIDAB in the last 72 hours. Anemia Panel: No results for input(s): VITAMINB12, FOLATE, FERRITIN, TIBC, IRON, RETICCTPCT in the last 72 hours. Urine analysis:    Component Value Date/Time   COLORURINE YELLOW 10/17/2017 [place] 10/17/2017 2205  LABSPEC 1.010 10/17/2017 2205   PHURINE 5.0 10/17/2017 2205   GLUCOSEU NEGATIVE 10/17/2017 2205   HGBUR SMALL (A) 10/17/2017 2205   BILIRUBINUR NEGATIVE 10/17/2017 2205   KETONESUR NEGATIVE 10/17/2017 2205   PROTEINUR NEGATIVE 10/17/2017 2205   UROBILINOGEN 0.2 09/08/2014 1444   NITRITE NEGATIVE 10/17/2017 2205   LEUKOCYTESUR NEGATIVE 10/17/2017 2205    Radiological Exams on Admission: Dg Chest 2 View  Result Date: 04/19/2018 CLINICAL DATA:  Shortness of breath EXAM: CHEST - 2 VIEW COMPARISON:  Nov 27, 2017 FINDINGS: There are multiple bullae, most notably in the right upper lobe. There are multiple areas of scarring. There is no edema or consolidation. The heart size is normal. Pulmonary vascularity reflects the underlying bullous disease in the upper lobes. No adenopathy. There is evidence of an old fracture of the anterior left fifth rib. 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 No edema or consolidation. Stable cardiac silhouette. No evident adenopathy. Emphysema (ICD10-J43.9). Electronically Signed   By: Lowella Grip III M.D.   On: 04/19/2018 21:39    EKG: Independently reviewed. Unable to to see on EMR or find paper tracing. Per Dr. Winfred Leeds:  ED ECG REPORT   Date: 04/19/2018  Rate: 80  Rhythm: normal sinus rhythm  QRS Axis: normal  Intervals: normal  ST/T Wave abnormalities: nonspecific T wave changes  Conduction Disutrbances:none  Narrative Interpretation:   Old EKG Reviewed:  unchanged  Assessment/Plan Principal Problem:   COPD with acute exacerbation ([place])  Observation/stepdown. Continue supplemental oxygen. BiPAP as needed. Xopenex 1.25 mg + ipratropium 0.5 mg via neb every 6 hours. Xopenex 1.25 mg twice daily every 6 hours as needed. Solu-Medrol 125 mg IVP x1 dose. Solu-Medrol 40 mg IVP every 6 hours x 4 doses. Magnesium sulfate 2 g IVPB x1 dose.  Active Problems:   Hyperlipidemia Not on medical therapy. Continue lifestyle modifications.    GERD with stricture Continue PPI.    ASCVD (arteriosclerotic cardiovascular disease) Continue aspirin and calcium channel blocker. Not on statin.    Type 2 diabetes mellitus without complication (HCC) Last hemoglobin A1c was 8.7% in May 18, this year. Carbohydrate modified diet. CBG monitoring with regular insulin sliding scale.    Paroxysmal atrial fibrillation (HCC)  CHA?DS?-VASc Score of at least 2. Not on  anticoagulation. Continue Cartia XT 120 mg p.o. daily. Continue levalbuterol as of bronchodilator. Monitor heart rate.    MAI (mycobacterium avium-intracellulare) (HCC) Continue rifampin, ethambutol and azithromycin. Follow-up with Dr. Vivien Rossetti from infectious diseases as scheduled.   DVT prophylaxis: Heparin SQ. Code Status: Full code. Family Communication:  Disposition Plan: Observation for 24 to 48 hours for COPD exacerbation treatment. Consults called:  Admission status: Observation/stepdown.   Reubin Milan MD [facility] Pager [phone].  If 7PM-7AM, please contact night-coverage [email or web address] Password [email or web address]  04/19/2018, 11:58 PM

## 2018-04-19 NOTE — ED Triage Notes (Signed)
Pt reports difficulty breathing for "a few days." Pt is on 6L [place] at home. Pt states his head "feels congested."

## 2018-04-20 DIAGNOSIS — K449 Diaphragmatic hernia without obstruction or gangrene: Secondary | ICD-10-CM | POA: Diagnosis not present

## 2018-04-20 DIAGNOSIS — I5032 Chronic diastolic (congestive) heart failure: Secondary | ICD-10-CM | POA: Diagnosis not present

## 2018-04-20 DIAGNOSIS — M5030 Other cervical disc degeneration, unspecified cervical region: Secondary | ICD-10-CM | POA: Diagnosis present

## 2018-04-20 DIAGNOSIS — J441 Chronic obstructive pulmonary disease with (acute) exacerbation: Secondary | ICD-10-CM | POA: Diagnosis present

## 2018-04-20 DIAGNOSIS — Z23 Encounter for immunization: Secondary | ICD-10-CM | POA: Diagnosis not present

## 2018-04-20 DIAGNOSIS — J9621 Acute and chronic respiratory failure with hypoxia: Secondary | ICD-10-CM | POA: Diagnosis present

## 2018-04-20 DIAGNOSIS — E1165 Type 2 diabetes mellitus with hyperglycemia: Secondary | ICD-10-CM | POA: Diagnosis present

## 2018-04-20 DIAGNOSIS — M359 Systemic involvement of connective tissue, unspecified: Secondary | ICD-10-CM | POA: Diagnosis present

## 2018-04-20 DIAGNOSIS — M5134 Other intervertebral disc degeneration, thoracic region: Secondary | ICD-10-CM | POA: Diagnosis present

## 2018-04-20 DIAGNOSIS — F17210 Nicotine dependence, cigarettes, uncomplicated: Secondary | ICD-10-CM | POA: Diagnosis present

## 2018-04-20 DIAGNOSIS — R402253 Coma scale, best verbal response, oriented, at hospital admission: Secondary | ICD-10-CM | POA: Diagnosis present

## 2018-04-20 DIAGNOSIS — K222 Esophageal obstruction: Secondary | ICD-10-CM | POA: Diagnosis present

## 2018-04-20 DIAGNOSIS — J449 Chronic obstructive pulmonary disease, unspecified: Secondary | ICD-10-CM | POA: Diagnosis not present

## 2018-04-20 DIAGNOSIS — D751 Secondary polycythemia: Secondary | ICD-10-CM | POA: Diagnosis present

## 2018-04-20 DIAGNOSIS — R402143 Coma scale, eyes open, spontaneous, at hospital admission: Secondary | ICD-10-CM | POA: Diagnosis present

## 2018-04-20 DIAGNOSIS — J329 Chronic sinusitis, unspecified: Secondary | ICD-10-CM | POA: Diagnosis present

## 2018-04-20 DIAGNOSIS — K2270 Barrett's esophagus without dysplasia: Secondary | ICD-10-CM | POA: Diagnosis present

## 2018-04-20 DIAGNOSIS — R402363 Coma scale, best motor response, obeys commands, at hospital admission: Secondary | ICD-10-CM | POA: Diagnosis present

## 2018-04-20 DIAGNOSIS — A310 Pulmonary mycobacterial infection: Secondary | ICD-10-CM | POA: Diagnosis present

## 2018-04-20 DIAGNOSIS — I480 Paroxysmal atrial fibrillation: Secondary | ICD-10-CM | POA: Diagnosis present

## 2018-04-20 DIAGNOSIS — R109 Unspecified abdominal pain: Secondary | ICD-10-CM | POA: Diagnosis not present

## 2018-04-20 DIAGNOSIS — M549 Dorsalgia, unspecified: Secondary | ICD-10-CM | POA: Diagnosis present

## 2018-04-20 DIAGNOSIS — G8929 Other chronic pain: Secondary | ICD-10-CM | POA: Diagnosis present

## 2018-04-20 DIAGNOSIS — I5033 Acute on chronic diastolic (congestive) heart failure: Secondary | ICD-10-CM | POA: Diagnosis present

## 2018-04-20 DIAGNOSIS — K219 Gastro-esophageal reflux disease without esophagitis: Secondary | ICD-10-CM | POA: Diagnosis present

## 2018-04-20 DIAGNOSIS — I2510 Atherosclerotic heart disease of native coronary artery without angina pectoris: Secondary | ICD-10-CM | POA: Diagnosis present

## 2018-04-20 DIAGNOSIS — M25512 Pain in left shoulder: Secondary | ICD-10-CM | POA: Diagnosis present

## 2018-04-20 LAB — CBC WITH DIFFERENTIAL/PLATELET
BASOS ABS: 0 10*3/uL (ref 0.0–0.1)
BASOS PCT: 1 %
Eosinophils Absolute: 0.1 10*3/uL (ref 0.0–0.5)
Eosinophils Relative: 1 %
HEMATOCRIT: 35.4 % — AB (ref 39.0–52.0)
HEMOGLOBIN: 11.5 g/dL — AB (ref 13.0–17.0)
LYMPHS PCT: 23 %
Lymphs Abs: 1.3 10*3/uL (ref 0.7–4.0)
MCH: 27.6 pg (ref 26.0–34.0)
MCHC: 32.5 g/dL (ref 30.0–36.0)
MCV: 84.9 fL (ref 80.0–100.0)
Monocytes Absolute: 0.4 10*3/uL (ref 0.1–1.0)
Monocytes Relative: 7 %
NEUTROS ABS: 4.1 10*3/uL (ref 1.7–7.7)
NEUTROS PCT: 68 %
Platelets: 166 10*3/uL (ref 150–400)
RBC: 4.17 MIL/uL — ABNORMAL LOW (ref 4.22–5.81)
RDW: 13.9 % (ref 11.5–15.5)
WBC: 5.9 10*3/uL (ref 4.0–10.5)

## 2018-04-20 LAB — GLUCOSE, CAPILLARY
Glucose-Capillary: 278 mg/dL — ABNORMAL HIGH (ref 70–99)
Glucose-Capillary: 492 mg/dL — ABNORMAL HIGH (ref 70–99)
Glucose-Capillary: 329 mg/dL — ABNORMAL HIGH (ref 70–99)
Glucose-Capillary: 266 mg/dL — ABNORMAL HIGH (ref 70–99)
Glucose-Capillary: 305 mg/dL — ABNORMAL HIGH (ref 70–99)

## 2018-04-20 LAB — BASIC METABOLIC PANEL
Anion gap: 11 (ref 5–15)
BUN: 15 mg/dL (ref 6–20)
CO2: 28 mmol/L (ref 22–32)
Calcium: 8.2 mg/dL — ABNORMAL LOW (ref 8.9–10.3)
Chloride: 94 mmol/L — ABNORMAL LOW (ref 98–111)
Creatinine, Ser: 0.91 mg/dL (ref 0.61–1.24)
GFR calc Af Amer: >60 mL/min (ref >60)
GLUCOSE: 296 mg/dL — AB (ref 70–99)
POTASSIUM: 3.8 mmol/L (ref 3.5–5.1)
Sodium: 133 mmol/L — ABNORMAL LOW (ref 135–145)

## 2018-04-20 LAB — PHOSPHORUS: PHOSPHORUS: 4.1 mg/dL (ref 2.5–4.6)

## 2018-04-20 LAB — MAGNESIUM: Magnesium: 2.4 mg/dL (ref 1.7–2.4)

## 2018-04-20 LAB — MRSA PCR SCREENING: MRSA BY PCR: NEGATIVE

## 2018-04-20 MED ORDER — LEVALBUTEROL HCL 1.25 MG/0.5ML IN NEBU
1.2500 mg | INHALATION_SOLUTION | Freq: Four times a day (QID) | RESPIRATORY_TRACT | Status: DC
  Administered 2018-04-20 – 2018-04-25 (×19): 1.25 mg via RESPIRATORY_TRACT
  Filled 2018-04-20 (×19): qty 0.5

## 2018-04-20 MED ORDER — ONDANSETRON HCL 4 MG PO TABS: 4.0000 mg | ORAL_TABLET | Freq: Four times a day (QID) | ORAL | Status: DC | PRN

## 2018-04-20 MED ORDER — RIFAMPIN 300 MG PO CAPS
600.0000 mg | ORAL_CAPSULE | Freq: Every day | ORAL | Status: DC
  Administered 2018-04-20 – 2018-04-25 (×6): 600 mg via ORAL
  Filled 2018-04-20 (×7): qty 2

## 2018-04-20 MED ORDER — OXYCODONE-ACETAMINOPHEN 5-325 MG PO TABS
1.0000 | ORAL_TABLET | ORAL | Status: DC | PRN
  Administered 2018-04-20: 1 via ORAL
  Filled 2018-04-20: qty 1

## 2018-04-20 MED ORDER — SODIUM CHLORIDE 0.9 % IV SOLN
1.0000 g | INTRAVENOUS | Status: DC
  Administered 2018-04-20 – 2018-04-25 (×6): 1 g via INTRAVENOUS
  Filled 2018-04-20: qty 1
  Filled 2018-04-20: qty 10
  Filled 2018-04-20 (×5): qty 1

## 2018-04-20 MED ORDER — OXYCODONE HCL 5 MG PO TABS
5.0000 mg | ORAL_TABLET | ORAL | Status: DC | PRN
  Administered 2018-04-20 – 2018-04-21 (×2): 5 mg via ORAL
  Filled 2018-04-20: qty 1

## 2018-04-20 MED ORDER — ONDANSETRON HCL 4 MG/2ML IJ SOLN
4.0000 mg | Freq: Four times a day (QID) | INTRAMUSCULAR | Status: DC | PRN
  Filled 2018-04-20: qty 2

## 2018-04-20 MED ORDER — BENZONATATE 100 MG PO CAPS
200.0000 mg | ORAL_CAPSULE | Freq: Three times a day (TID) | ORAL | Status: DC
  Administered 2018-04-20 – 2018-04-25 (×16): 200 mg via ORAL
  Filled 2018-04-20 (×16): qty 2

## 2018-04-20 MED ORDER — POTASSIUM CHLORIDE CRYS ER 20 MEQ PO TBCR
40.0000 meq | EXTENDED_RELEASE_TABLET | Freq: Every day | ORAL | Status: DC
  Administered 2018-04-20 – 2018-04-25 (×6): 40 meq via ORAL
  Filled 2018-04-20 (×5): qty 2

## 2018-04-20 MED ORDER — IPRATROPIUM BROMIDE 0.02 % IN SOLN
0.5000 mg | Freq: Four times a day (QID) | RESPIRATORY_TRACT | Status: DC
  Administered 2018-04-20 – 2018-04-25 (×19): 0.5 mg via RESPIRATORY_TRACT
  Filled 2018-04-20 (×19): qty 2.5

## 2018-04-20 MED ORDER — MAGNESIUM SULFATE 2 GM/50ML IV SOLN
2.0000 g | Freq: Once | INTRAVENOUS | Status: AC
  Administered 2018-04-20: 2 g via INTRAVENOUS
  Filled 2018-04-20: qty 50

## 2018-04-20 MED ORDER — NITROGLYCERIN 0.4 MG SL SUBL: 0.4000 mg | SUBLINGUAL_TABLET | SUBLINGUAL | Status: DC | PRN

## 2018-04-20 MED ORDER — PHENOL 1.4 % MT LIQD
1.0000 | OROMUCOSAL | Status: DC | PRN
  Filled 2018-04-20: qty 177

## 2018-04-20 MED ORDER — GUAIFENESIN ER 600 MG PO TB12
1200.0000 mg | ORAL_TABLET | Freq: Two times a day (BID) | ORAL | Status: DC
  Administered 2018-04-20 – 2018-04-25 (×11): 1200 mg via ORAL
  Filled 2018-04-20 (×11): qty 2

## 2018-04-20 MED ORDER — LEVALBUTEROL HCL 1.25 MG/0.5ML IN NEBU: 1.2500 mg | INHALATION_SOLUTION | Freq: Four times a day (QID) | RESPIRATORY_TRACT | Status: DC | PRN

## 2018-04-20 MED ORDER — HYDROMORPHONE HCL 1 MG/ML IJ SOLN
0.5000 mg | INTRAMUSCULAR | Status: DC | PRN
  Administered 2018-04-20 – 2018-04-22 (×9): 0.5 mg via INTRAVENOUS
  Filled 2018-04-20 (×9): qty 0.5

## 2018-04-20 MED ORDER — HEPARIN SODIUM (PORCINE) 5000 UNIT/ML IJ SOLN
5000.0000 [IU] | Freq: Three times a day (TID) | INTRAMUSCULAR | Status: DC
  Administered 2018-04-20 – 2018-04-25 (×16): 5000 [IU] via SUBCUTANEOUS
  Filled 2018-04-20 (×16): qty 1

## 2018-04-20 MED ORDER — METHYLPREDNISOLONE SODIUM SUCC 40 MG IJ SOLR
40.0000 mg | Freq: Four times a day (QID) | INTRAMUSCULAR | Status: AC
  Administered 2018-04-20 – 2018-04-21 (×4): 40 mg via INTRAVENOUS
  Filled 2018-04-20 (×4): qty 1

## 2018-04-20 MED ORDER — OSELTAMIVIR PHOSPHATE 75 MG PO CAPS
75.0000 mg | ORAL_CAPSULE | Freq: Two times a day (BID) | ORAL | Status: DC
  Administered 2018-04-20: 75 mg via ORAL
  Filled 2018-04-20: qty 1

## 2018-04-20 MED ORDER — OXYCODONE-ACETAMINOPHEN 10-325 MG PO TABS: 1.0000 | ORAL_TABLET | ORAL | Status: DC | PRN

## 2018-04-20 MED ORDER — GUAIFENESIN 200 MG PO TABS
400.0000 mg | ORAL_TABLET | ORAL | Status: DC
  Filled 2018-04-20 (×9): qty 2

## 2018-04-20 MED ORDER — BACLOFEN 10 MG PO TABS
20.0000 mg | ORAL_TABLET | Freq: Three times a day (TID) | ORAL | Status: DC
  Administered 2018-04-20 – 2018-04-25 (×16): 20 mg via ORAL
  Filled 2018-04-20 (×16): qty 2

## 2018-04-20 MED ORDER — IPRATROPIUM BROMIDE 0.02 % IN SOLN: 0.5000 mg | Freq: Four times a day (QID) | RESPIRATORY_TRACT | Status: DC

## 2018-04-20 MED ORDER — INSULIN ASPART 100 UNIT/ML ~~LOC~~ SOLN
0.0000 [IU] | Freq: Three times a day (TID) | SUBCUTANEOUS | Status: DC
  Administered 2018-04-20 (×2): 11 [IU] via SUBCUTANEOUS
  Administered 2018-04-20: 20 [IU] via SUBCUTANEOUS
  Administered 2018-04-21 (×2): 7 [IU] via SUBCUTANEOUS
  Administered 2018-04-21: 15 [IU] via SUBCUTANEOUS
  Administered 2018-04-22 (×3): 11 [IU] via SUBCUTANEOUS
  Administered 2018-04-23: 15 [IU] via SUBCUTANEOUS
  Administered 2018-04-23: 7 [IU] via SUBCUTANEOUS
  Administered 2018-04-23: 4 [IU] via SUBCUTANEOUS
  Administered 2018-04-24 (×2): 11 [IU] via SUBCUTANEOUS
  Administered 2018-04-24: 4 [IU] via SUBCUTANEOUS
  Administered 2018-04-25: 7 [IU] via SUBCUTANEOUS
  Administered 2018-04-25: 11 [IU] via SUBCUTANEOUS

## 2018-04-20 MED ORDER — MONTELUKAST SODIUM 10 MG PO TABS
10.0000 mg | ORAL_TABLET | Freq: Every day | ORAL | Status: DC
  Administered 2018-04-20 – 2018-04-24 (×5): 10 mg via ORAL
  Filled 2018-04-20 (×5): qty 1

## 2018-04-20 MED ORDER — ETHAMBUTOL HCL 400 MG PO TABS
1200.0000 mg | ORAL_TABLET | Freq: Every day | ORAL | Status: DC
  Administered 2018-04-20 – 2018-04-25 (×6): 1200 mg via ORAL
  Filled 2018-04-20 (×7): qty 3

## 2018-04-20 MED ORDER — ORAL CARE MOUTH RINSE
15.0000 mL | Freq: Two times a day (BID) | OROMUCOSAL | Status: DC
  Administered 2018-04-21 – 2018-04-24 (×7): 15 mL via OROMUCOSAL

## 2018-04-20 MED ORDER — PREDNISONE 20 MG PO TABS: 40.0000 mg | ORAL_TABLET | Freq: Every day | ORAL | Status: DC

## 2018-04-20 MED ORDER — POTASSIUM CHLORIDE CRYS ER 20 MEQ PO TBCR
40.0000 meq | EXTENDED_RELEASE_TABLET | Freq: Once | ORAL | Status: AC
  Administered 2018-04-20: 40 meq via ORAL
  Filled 2018-04-20: qty 2

## 2018-04-20 MED ORDER — FUROSEMIDE 40 MG PO TABS
40.0000 mg | ORAL_TABLET | Freq: Two times a day (BID) | ORAL | Status: DC
  Administered 2018-04-20 – 2018-04-25 (×11): 40 mg via ORAL
  Filled 2018-04-20 (×11): qty 1

## 2018-04-20 MED ORDER — OXYCODONE-ACETAMINOPHEN 5-325 MG PO TABS: 1.0000 | ORAL_TABLET | ORAL | Status: DC | PRN

## 2018-04-20 MED ORDER — ACETAMINOPHEN 325 MG PO TABS: 650.0000 mg | ORAL_TABLET | Freq: Four times a day (QID) | ORAL | Status: DC | PRN

## 2018-04-20 MED ORDER — INSULIN GLARGINE 100 UNIT/ML ~~LOC~~ SOLN
24.0000 [IU] | Freq: Every day | SUBCUTANEOUS | Status: DC
  Administered 2018-04-20: 24 [IU] via SUBCUTANEOUS
  Filled 2018-04-20 (×3): qty 0.24

## 2018-04-20 MED ORDER — METHYLPREDNISOLONE SODIUM SUCC 125 MG IJ SOLR
125.0000 mg | Freq: Once | INTRAMUSCULAR | Status: AC
  Administered 2018-04-20: 125 mg via INTRAVENOUS
  Filled 2018-04-20: qty 2

## 2018-04-20 MED ORDER — LEVALBUTEROL HCL 1.25 MG/0.5ML IN NEBU: 1.2500 mg | INHALATION_SOLUTION | Freq: Four times a day (QID) | RESPIRATORY_TRACT | Status: DC

## 2018-04-20 MED ORDER — PNEUMOCOCCAL VAC POLYVALENT 25 MCG/0.5ML IJ INJ
0.5000 mL | INJECTION | INTRAMUSCULAR | Status: AC
  Administered 2018-04-21: 0.5 mL via INTRAMUSCULAR
  Filled 2018-04-20: qty 0.5

## 2018-04-20 MED ORDER — AZITHROMYCIN 250 MG PO TABS
500.0000 mg | ORAL_TABLET | Freq: Every day | ORAL | Status: DC
  Administered 2018-04-20 – 2018-04-25 (×6): 500 mg via ORAL
  Filled 2018-04-20 (×6): qty 2

## 2018-04-20 MED ORDER — DILTIAZEM HCL ER COATED BEADS 120 MG PO CP24
120.0000 mg | ORAL_CAPSULE | Freq: Every day | ORAL | Status: DC
  Administered 2018-04-20 – 2018-04-25 (×6): 120 mg via ORAL
  Filled 2018-04-20 (×6): qty 1

## 2018-04-20 MED ORDER — ACETAMINOPHEN 650 MG RE SUPP: 650.0000 mg | Freq: Four times a day (QID) | RECTAL | Status: DC | PRN

## 2018-04-20 MED ORDER — LORAZEPAM 0.5 MG PO TABS
0.5000 mg | ORAL_TABLET | Freq: Four times a day (QID) | ORAL | Status: DC | PRN
  Administered 2018-04-23 – 2018-04-25 (×6): 0.5 mg via ORAL
  Filled 2018-04-20 (×8): qty 1

## 2018-04-20 MED ORDER — PANTOPRAZOLE SODIUM 40 MG PO TBEC
40.0000 mg | DELAYED_RELEASE_TABLET | Freq: Every day | ORAL | Status: DC
  Administered 2018-04-20 – 2018-04-25 (×6): 40 mg via ORAL
  Filled 2018-04-20 (×6): qty 1

## 2018-04-20 MED ORDER — OXYCODONE HCL 5 MG PO TABS
5.0000 mg | ORAL_TABLET | ORAL | Status: DC | PRN
  Filled 2018-04-20: qty 1

## 2018-04-20 MED ORDER — METFORMIN HCL 500 MG PO TABS
500.0000 mg | ORAL_TABLET | Freq: Every day | ORAL | Status: DC
  Administered 2018-04-20 – 2018-04-25 (×5): 500 mg via ORAL
  Filled 2018-04-20 (×6): qty 1

## 2018-04-20 NOTE — Progress Notes (Signed)
Inpatient Diabetes Program Recommendations  AACE/ADA: New Consensus Statement on Inpatient Glycemic Control (2015)  Target Ranges:  Prepandial:   less than 140 mg/dL      Peak postprandial:   less than 180 mg/dL (1-2 hours)      Critically ill patients:  140 - 180 mg/dL   Lab Results  Component Value Date   GLUCAP 278 (H) 04/20/2018   HGBA1C 8.7 (H) 11/28/2017    Review of Glycemic Control Results for Ryan [name], Ryan [name] (MRN [id number]) as of 04/20/2018 09:36  Ref. Range 04/19/2018 21:12 04/20/2018 07:46  Glucose-Capillary Latest Ref Range: 70 - 99 mg/dL 240 (H) 278 (H)   Diabetes history: Type 2 DM Outpatient Diabetes medications: Metformin 500 mg QAM, Humalog SSI QID (with max 60 units), Lantus 24 units QD Current orders for Inpatient glycemic control: Lantus 24 units QD, Novolog 0-20 units TID, Metformin 500 mg QAM Solumedrol 40 mg Q6H  Inpatient Diabetes Program Recommendations:    Last A1C was from May 2019, consider repeating A1C?  Additionally, in the setting of steroids, anticipate BS trends to increase. Consider adding Novolog 5 units TID (Assuming patient is consuming >50% of meal).  Thanks, Bronson Curb, MSN, RNC-OB Diabetes Coordinator [phone] (8a-5p)

## 2018-04-20 NOTE — Progress Notes (Signed)
OT Cancellation Note  Patient Details Name: Ryan [name] MRN: [id number] DOB: Jan 08, 1961   Cancelled Treatment:    Reason Eval/Treat Not Completed: OT screened, no needs identified, will sign off. Pt is independent in ADLs at baseline, primary deficit at this time is SOB. Pt educated on energy conservation techniques, verbalized understanding, handout provided. No further OT services required at this time.   Guadelupe Sabin, OTR/L  [phone] 04/20/2018, 7:47 AM

## 2018-04-20 NOTE — Progress Notes (Signed)
BG 492. Dr. Luan Pulling paged. Verbal order to give 20 unit now and recheck BG in 1 hour.

## 2018-04-20 NOTE — Progress Notes (Signed)
Subjective: He was admitted with increased shortness of breath.  He says that he started with some swelling of his legs about 2 days ago and has been coughing productively.  His productive cough is not much different but he is much more short of breath.  His respiratory panel shows influenza B.  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  He is complaining of pain  Objective: Vital signs in last 24 hours: Temp:  [97.7 F (36.5 C)-98.1 F (36.7 C)] 98.1 F (36.7 C) (10/08 0747) Pulse Rate:  [66-90] 73 (10/08 0747) Resp:  [13-20] 13 (10/08 0747) BP: (103-145)/(59-86) 123/81 (10/08 0747) SpO2:  [93 %-100 %] 95 % (10/08 0807) FiO2 (%):  [44 %] 44 % (10/08 0117) Weight:  [69.9 kg-75.6 kg] 75.6 kg (10/08 0500) Weight change:     Intake/Output from previous day: No intake/output data recorded.  PHYSICAL EXAM General appearance: alert, cooperative and mild distress Resp: rhonchi bilaterally Cardio: regular rate and rhythm, S1, S2 normal, no murmur, click, rub or gallop GI: soft, non-tender; bowel sounds normal; no masses,  no organomegaly Extremities: extremities normal, atraumatic, no cyanosis or edema  Lab Results:  Results for orders placed or performed during the hospital encounter of 04/19/18 (from the past 48 hour(s))  CBG monitoring, ED     Status: Abnormal   Collection Time: 04/19/18  9:12 PM  Result Value Ref Range   Glucose-Capillary 240 (H) 70 - 99 mg/dL   Comment 1 Notify RN   CBC     Status: Abnormal   Collection Time: 04/19/18  9:53 PM  Result Value Ref Range   WBC 6.7 4.0 - 10.5 K/uL   RBC 4.50 4.22 - 5.81 MIL/uL   Hemoglobin 12.5 (L) 13.0 - 17.0 g/dL   HCT 37.9 (L) 39.0 - 52.0 %   MCV 84.2 78.0 - 100.0 fL   MCH 27.8 26.0 - 34.0 pg   MCHC 33.0 30.0 - 36.0 g/dL   RDW 13.9 11.5 - 15.5 %   Platelets 191 150 - 400 K/uL    Comment: Performed at [facility], [address], [place], [place] [address]  Basic metabolic panel     Status:  Abnormal   Collection Time: 04/19/18  9:53 PM  Result Value Ref Range   Sodium 137 135 - 145 mmol/L   Potassium 3.5 3.5 - 5.1 mmol/L   Chloride 96 (L) 98 - 111 mmol/L   CO2 30 22 - 32 mmol/L   Glucose, Bld 216 (H) 70 - 99 mg/dL   BUN 18 6 - 20 mg/dL   Creatinine, Ser 0.95 0.61 - 1.24 mg/dL   Calcium 8.7 (L) 8.9 - 10.3 mg/dL   GFR calc non Af Amer >60 >60 mL/min   GFR calc Af Amer >60 >60 mL/min    Comment: (NOTE) The eGFR has been calculated using the CKD EPI equation. This calculation has not been validated in all clinical situations. eGFR's persistently <60 mL/min signify possible Chronic Kidney Disease.    Anion gap 11 5 - 15    Comment: Performed at [facility], [address] [place], [place] [address]  Brain natriuretic peptide     Status: None   Collection Time: 04/19/18  9:53 PM  Result Value Ref Range   B Natriuretic Peptide 17.0 0.0 - 100.0 pg/mL    Comment: Performed at [facility], [address], [place], [place] [address]  Magnesium     Status: None   Collection Time: 04/19/18  9:53 PM  Result Value Ref Range   Magnesium 2.4 1.7 - 2.4 mg/dL    Comment: Performed at [facility], [address] [place], [place] [address]  Phosphorus     Status: None   Collection Time: 04/19/18  9:53 PM  Result Value Ref Range   Phosphorus 4.1 2.5 - 4.6 mg/dL    Comment: Performed at [facility], [address] [place], [place] [address]  MRSA PCR Screening     Status: None   Collection Time: 04/20/18  1:07 AM  Result Value Ref Range   MRSA by PCR NEGATIVE NEGATIVE    Comment:        The GeneXpert MRSA Assay (FDA approved for NASAL specimens only), is one component of a comprehensive MRSA colonization surveillance program. It is not intended to diagnose MRSA infection nor to guide or monitor treatment for MRSA infections. Performed at [facility], [address], [place], [place] [address]   CBC WITH DIFFERENTIAL     Status: Abnormal   Collection Time: 04/20/18  4:17  AM  Result Value Ref Range   WBC 5.9 4.0 - 10.5 K/uL   RBC 4.17 (L) 4.22 - 5.81 MIL/uL   Hemoglobin 11.5 (L) 13.0 - 17.0 g/dL   HCT 35.4 (L) 39.0 - 52.0 %   MCV 84.9 80.0 - 100.0 fL   MCH 27.6 26.0 - 34.0 pg   MCHC 32.5 30.0 - 36.0 g/dL   RDW 13.9 11.5 - 15.5 %   Platelets 166 150 - 400 K/uL   Neutrophils Relative % 68 %   Neutro Abs 4.1 1.7 - 7.7 K/uL   Lymphocytes Relative 23 %   Lymphs Abs 1.3 0.7 - 4.0 K/uL   Monocytes Relative 7 %   Monocytes Absolute 0.4 0.1 - 1.0 K/uL   Eosinophils Relative 1 %   Eosinophils Absolute 0.1 0.0 - 0.5 K/uL   Basophils Relative 1 %   Basophils Absolute 0.0 0.0 - 0.1 K/uL    Comment: Performed at [facility], [address] [place], [place] [address]  Glucose, capillary     Status: Abnormal   Collection Time: 04/20/18  7:46 AM  Result Value Ref Range   Glucose-Capillary 278 (H) 70 - 99 mg/dL    ABGS No results for input(s): PHART, PO2ART, TCO2, HCO3 in the last 72 hours.  Invalid input(s): PCO2 CULTURES Recent Results (from the past 240 hour(s))  MRSA PCR Screening     Status: None   Collection Time: 04/20/18  1:07 AM  Result Value Ref Range Status   MRSA by PCR NEGATIVE NEGATIVE Final    Comment:        The GeneXpert MRSA Assay (FDA approved for NASAL specimens only), is one component of a comprehensive MRSA colonization surveillance program. It is not intended to diagnose MRSA infection nor to guide or monitor treatment for MRSA infections. Performed at [facility], [address] [place], [place] [address]    Studies/Results: Dg Chest 2 View  Result Date: 04/19/2018 CLINICAL DATA:  Shortness of breath EXAM: CHEST - 2 VIEW COMPARISON:  Nov 27, 2017 FINDINGS: There are multiple bullae, most notably in the right upper lobe. There are multiple areas of scarring. There is no edema or consolidation. The heart size is normal. Pulmonary vascularity reflects the underlying bullous disease in the upper lobes. No adenopathy. There is  evidence of an old fracture of the anterior left fifth rib. 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 No edema or  consolidation. Stable cardiac silhouette. No evident adenopathy. Emphysema (ICD10-J43.9). Electronically Signed   By: Lowella Grip III M.D.   On: 04/19/2018 21:39    Medications:  Prior to Admission:  Medications Prior to Admission  Medication Sig Dispense Refill Last Dose  . albuterol (VENTOLIN HFA) 108 (90 BASE) MCG/ACT inhaler Inhale 2 puffs into the lungs every 6 (six) hours as needed for wheezing or shortness of breath.   Taking  . azithromycin (ZITHROMAX) 500 MG tablet Take 1 tablet (500 mg total) by mouth daily. 30 tablet 11 Taking  . baclofen (LIORESAL) 20 MG tablet Take 20 mg by mouth 3 (three) times daily.    Taking  . benzonatate (TESSALON) 200 MG capsule Take 200 mg by mouth 3 (three) times daily.   Taking  . budesonide (PULMICORT) 0.25 MG/2ML nebulizer solution Take 2 mLs (0.25 mg total) by nebulization 2 (two) times daily. 60 mL 12 Taking  . CARTIA XT 120 MG 24 hr capsule Take 1 capsule (120 mg total) by mouth daily. 7 capsule 0   . ethambutol (MYAMBUTOL) 400 MG tablet Take 3 tablets (1,200 mg total) by mouth daily. 90 tablet 11 Taking  . Fluticasone-Umeclidin-Vilant (TRELEGY ELLIPTA) 100-62.5-25 MCG/INH AEPB Inhale 1 puff into the lungs daily. Rinse mouth after you use this 1 each 12 Taking  . furosemide (LASIX) 40 MG tablet Take 1 tablet (40 mg total) by mouth 2 (two) times daily. 60 tablet 12 Taking  . guaifenesin (HUMIBID E) 400 MG TABS tablet Take 400 mg by mouth every 4 (four) hours.   Taking  . HUMALOG KWIKPEN 100 UNIT/ML KiwkPen Sliding scale  0 Taking  . insulin glargine (LANTUS) 100 UNIT/ML injection Inject 0.24 mLs (24 Units total) into the skin daily. 24 units daily 10 mL 11 Taking  . insulin lispro (HUMALOG) 100 UNIT/ML injection Take by sliding scale provided by hospital maximum amount daily is 60 units.  Check blood sugar 4 times  a day 10 mL 11 Taking  . ipratropium (ATROVENT) 0.02 % nebulizer solution VVN TID WITH LEVALBUTEROL  5 Taking  . levalbuterol (XOPENEX) 0.63 MG/3ML nebulizer solution Take 3 mLs (0.63 mg total) by nebulization every 8 (eight) hours as needed for wheezing or shortness of breath. 3 mL 12 Taking  . LORazepam (ATIVAN) 0.5 MG tablet Take 1 tablet (0.5 mg total) by mouth 2 (two) times daily as needed for anxiety or sleep. (Patient taking differently: Take 0.5 mg by mouth 4 (four) times daily as needed for anxiety or sleep. ) 20 tablet 0 Taking  . metFORMIN (GLUCOPHAGE) 500 MG tablet Take 1 tablet (500 mg total) by mouth daily with breakfast. 30 tablet 3 Taking  . montelukast (SINGULAIR) 10 MG tablet Take 10 mg by mouth at bedtime.   Taking  . nitroGLYCERIN (NITROSTAT) 0.4 MG SL tablet Place 1 tablet (0.4 mg total) under the tongue every 5 (five) minutes as needed for chest pain. 25 tablet 4 Taking  . omeprazole (PRILOSEC) 20 MG capsule Take 20 mg by mouth daily.   Taking  . oxyCODONE-acetaminophen (PERCOCET) 10-325 MG tablet Take 1 tablet by mouth every 4 (four) hours as needed for pain.   Taking  . predniSONE (DELTASONE) 10 MG tablet Take 2 daily for 1 week then 1 daily (Patient taking differently: Take 10 mg by mouth daily with breakfast. Take 2 daily for 1 week then 1 daily) 60 tablet 5 Taking  . rifampin (RIFADIN) 300 MG capsule Take 2 capsules (600 mg total) by mouth daily. [place]  capsule 11 Taking   Scheduled: . azithromycin  500 mg Oral Daily  . baclofen  20 mg Oral TID  . benzonatate  200 mg Oral TID  . diltiazem  120 mg Oral Daily  . ethambutol  1,200 mg Oral Daily  . furosemide  40 mg Oral BID  . guaiFENesin  1,200 mg Oral BID  . heparin  5,000 Units Subcutaneous Q8H  . insulin aspart  0-20 Units Subcutaneous TID WC  . insulin glargine  24 Units Subcutaneous Daily  . ipratropium  0.5 mg Nebulization Q6H  . levalbuterol  1.25 mg Nebulization Q6H  . metFORMIN  500 mg Oral Q breakfast  .  methylPREDNISolone (SOLU-MEDROL) injection  40 mg Intravenous Q6H  . montelukast  10 mg Oral QHS  . oseltamivir  75 mg Oral BID  . pantoprazole  40 mg Oral Daily  . potassium chloride  40 mEq Oral Daily  . rifampin  600 mg Oral Daily  . sodium chloride       Continuous:  VZS:MOLMBEMLJQGBE **OR** acetaminophen, HYDROmorphone, levalbuterol, LORazepam, nitroGLYCERIN, ondansetron **OR** ondansetron (ZOFRAN) IV, oxyCODONE-acetaminophen **AND** oxyCODONE, oxyCODONE-acetaminophen **AND** oxyCODONE, phenol  Assesment: He was admitted with COPD with exacerbation.  This appears to be due to influenza B. He is on appropriate treatment and I have started Tamiflu this morning.  He has acute on chronic diastolic heart failure and that is better.  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  He has chronic back pain which is giving him more trouble  He has diabetes and is on sliding scale Principal Problem:   COPD with acute exacerbation ([place]) Active Problems:   Hyperlipidemia   GERD with stricture   ASCVD (arteriosclerotic cardiovascular disease)   Type 2 diabetes mellitus without complication (HCC)   Paroxysmal atrial fibrillation (HCC)   MAI (mycobacterium avium-intracellulare) ([place])    Plan: Continue treatments.  Full admission.  Treat for influenza B.  Continue diuresis for now but that may be able to be discontinued tomorrow    LOS: 0 days   Ryan [name] L 04/20/2018, 8:28 AM

## 2018-04-20 NOTE — Evaluation (Signed)
Physical Therapy Evaluation Patient Details Name: Ryan [name] MRN: [id number] DOB: 1961-02-19 Today's Date: 04/20/2018   History of Present Illness   Ryan [name] is a 57 y.o. 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  He denies headache, sore throat, hemoptysis, abdominal pain, nausea, emesis, diarrhea, constipation, melena or hematochezia.  No dysuria, frequency or hematuria.  Denies polyuria, polydipsia, polyphagia or blurred vision.  No heat or cold intolerance.    Clinical Impression  Patient functioning at baseline for functional mobility and gait.  Plan:  Patient discharged from physical therapy to care of nursing for ambulation daily as tolerated for length of stay.    Follow Up Recommendations No PT follow up    Equipment Recommendations  None recommended by PT    Recommendations for Other Services       Precautions / Restrictions Precautions Precautions: None Restrictions Weight Bearing Restrictions: No      Mobility  Bed Mobility Overal bed mobility: Modified Independent             General bed mobility comments: uses bed rail  Transfers Overall transfer level: Modified independent Equipment used: None             General transfer comment:  increased time  Ambulation/Gait Ambulation/Gait assistance: Modified independent (Device/Increase time) Gait Distance (Feet): 100 Feet Assistive device: None Gait Pattern/deviations: WFL(Within Functional Limits) Gait velocity: slow (baseline for patient)   General Gait Details: grossly WFL, ambulated in room and hallways without loss of balance while on 6 LPM O2  Stairs            Wheelchair Mobility    Modified Rankin (Stroke Patients Only)       Balance Overall balance assessment: No apparent balance deficits (not formally assessed)                                           Pertinent Vitals/Pain Pain Assessment: No/denies pain    Home Living Family/patient expects to be discharged to:: Private residence Living Arrangements: Spouse/significant other Available Help at Discharge: Family Type of Home: Mobile home Home Access: Stairs to enter Entrance Stairs-Rails: Right;Left;Can reach both Entrance Stairs-Number of Steps: 6-8 Home Layout: One level Home Equipment: Cane - single point;Walker - 2 wheels      Prior Function Level of Independence: Independent               Hand Dominance        Extremity/Trunk Assessment   Upper Extremity Assessment Upper Extremity Assessment: Overall WFL for tasks assessed    Lower Extremity Assessment Lower Extremity Assessment: Overall WFL for tasks assessed    Cervical / Trunk Assessment Cervical / Trunk Assessment: Normal  Communication   Communication: No difficulties  Cognition Arousal/Alertness: Awake/alert Behavior During Therapy: WFL for tasks assessed/performed Overall Cognitive Status:  Within Functional Limits for tasks assessed                                        General Comments      Exercises     Assessment/Plan    PT Assessment Patent does not need any further PT services  PT Problem List         PT Treatment Interventions      PT Goals  (Current goals can be found in the Care Plan section)  Acute Rehab PT Goals Patient Stated Goal: return home PT Goal Formulation: With patient Time For Goal Achievement: 04/20/18 Potential to Achieve Goals: Good    Frequency     Barriers to discharge        Co-evaluation               AM-PAC PT "6 Clicks" Daily Activity  Outcome Measure Difficulty turning over in bed (including adjusting bedclothes, sheets and blankets)?: None Difficulty moving from lying on back to sitting on the side of the bed? : None Difficulty sitting down on and standing up from a chair with arms (e.g., wheelchair, bedside commode, etc,.)?: None Help needed moving to and from a bed to chair (including a wheelchair)?: None Help needed walking in hospital room?: None Help needed climbing 3-5 steps with a railing? : None 6 Click Score: 24    End of Session Equipment Utilized During Treatment: Oxygen Activity Tolerance: Patient tolerated treatment well Patient left: in bed;with call bell/phone within reach(seated at bedside) Nurse Communication: Mobility status PT Visit Diagnosis: Unsteadiness on feet (R26.81);Other abnormalities of gait and mobility (R26.89);Muscle weakness (generalized) (M62.81)    Time: 9741-6384 PT Time Calculation (min) (ACUTE ONLY): 15 min   Charges:   PT Evaluation $PT Eval Low Complexity: 1 Low PT Treatments $Therapeutic Activity: 8-22 mins        11:49 AM, 04/20/18 Lonell Grandchild, MPT Physical Therapist with [facility] 336 [phone] office [phone] mobile phone

## 2018-04-20 NOTE — Progress Notes (Signed)
Nutrition Brief Note  Nutrition screened performed as part of COPD gold procotol   Wt Readings from Last 15 Encounters:  04/20/18 75.6 kg  12/30/17 69.9 kg  11/27/17 66.9 kg  11/17/17 70.4 kg  10/30/17 65.2 kg  10/24/17 67.8 kg  10/04/17 65.9 kg  09/15/17 63.5 kg  07/03/17 65.3 kg  06/09/17 65.3 kg  12/26/16 63.5 kg  09/24/16 66.7 kg  08/29/15 65.3 kg  08/22/15 73.1 kg  08/01/15 63.6 kg   Body mass index is 25.34 kg/m. Patient meets criteria for overweight based on current BMI.   Patient seen as he is about to eat lunch.   He says his appetite is "fine". He denies his COPD affecting his appetite in any way. He reports gaining weight, which is confirmed with objective chart history. His weight gain is deemed to be favorable, as he had once been borderline underweight.   Patient has no nutrition concerns at this time. RD also does not have any nutrition concerns regarding patient.   No nutrition interventions warranted at this time. If nutrition issues arise, please consult RD.   Burtis Junes RD, LDN, CNSC Clinical Nutrition Available Tues-Sat via Pager: [phone] 04/20/2018 12:41 PM

## 2018-04-21 LAB — BASIC METABOLIC PANEL
ANION GAP: 12 (ref 5–15)
BUN: 18 mg/dL (ref 6–20)
CO2: 29 mmol/L (ref 22–32)
Calcium: 8.6 mg/dL — ABNORMAL LOW (ref 8.9–10.3)
Chloride: 94 mmol/L — ABNORMAL LOW (ref 98–111)
Creatinine, Ser: 1.04 mg/dL (ref 0.61–1.24)
GFR calc Af Amer: >60 mL/min (ref >60)
Glucose, Bld: 370 mg/dL — ABNORMAL HIGH (ref 70–99)
Potassium: 4.2 mmol/L (ref 3.5–5.1)
SODIUM: 135 mmol/L (ref 135–145)

## 2018-04-21 LAB — GLUCOSE, CAPILLARY
GLUCOSE-CAPILLARY: 326 mg/dL — AB (ref 70–99)
GLUCOSE-CAPILLARY: 297 mg/dL — AB (ref 70–99)
Glucose-Capillary: 225 mg/dL — ABNORMAL HIGH (ref 70–99)
Glucose-Capillary: 204 mg/dL — ABNORMAL HIGH (ref 70–99)

## 2018-04-21 LAB — CBC WITH DIFFERENTIAL/PLATELET
Abs Immature Granulocytes: 0.05 10*3/uL (ref 0.00–0.07)
BASOS PCT: 1 %
Basophils Absolute: 0 10*3/uL (ref 0.0–0.1)
Eosinophils Absolute: 0 10*3/uL (ref 0.0–0.5)
Eosinophils Relative: 0 %
HCT: 39.5 % (ref 39.0–52.0)
Hemoglobin: 12.4 g/dL — ABNORMAL LOW (ref 13.0–17.0)
IMMATURE GRANULOCYTES: 1 %
Lymphocytes Relative: 16 %
Lymphs Abs: 0.8 10*3/uL (ref 0.7–4.0)
MCH: 27.3 pg (ref 26.0–34.0)
MCHC: 31.4 g/dL (ref 30.0–36.0)
MCV: 87 fL (ref 80.0–100.0)
Monocytes Absolute: 0.3 10*3/uL (ref 0.1–1.0)
Monocytes Relative: 6 %
NEUTROS PCT: 76 %
NRBC: 0 % (ref 0.0–0.2)
Neutro Abs: 4.1 10*3/uL (ref 1.7–7.7)
PLATELETS: 191 10*3/uL (ref 150–400)
RBC: 4.54 MIL/uL (ref 4.22–5.81)
RDW: 13.9 % (ref 11.5–15.5)
WBC: 5.4 10*3/uL (ref 4.0–10.5)

## 2018-04-21 MED ORDER — OXYCODONE HCL 5 MG PO TABS
10.0000 mg | ORAL_TABLET | ORAL | Status: DC | PRN
  Administered 2018-04-21 – 2018-04-25 (×17): 10 mg via ORAL
  Filled 2018-04-21 (×17): qty 2

## 2018-04-21 MED ORDER — INSULIN GLARGINE 100 UNIT/ML ~~LOC~~ SOLN
28.0000 [IU] | Freq: Every day | SUBCUTANEOUS | Status: DC
  Administered 2018-04-21: 28 [IU] via SUBCUTANEOUS
  Filled 2018-04-21 (×3): qty 0.28

## 2018-04-21 MED ORDER — ALUM & MAG HYDROXIDE-SIMETH 200-200-20 MG/5ML PO SUSP
30.0000 mL | ORAL | Status: DC | PRN
  Administered 2018-04-21: 30 mL via ORAL
  Filled 2018-04-21: qty 30

## 2018-04-21 NOTE — Progress Notes (Signed)
Late entry  Patient's CBG 305, no coverage ordered. Mid level aware. Will continue to monitor.

## 2018-04-21 NOTE — Progress Notes (Signed)
Inpatient Diabetes Program Recommendations  AACE/ADA: New Consensus Statement on Inpatient Glycemic Control (2015)  Target Ranges:  Prepandial:   less than 140 mg/dL      Peak postprandial:   less than 180 mg/dL (1-2 hours)      Critically ill patients:  140 - 180 mg/dL   Lab Results  Component Value Date   GLUCAP 326 (H) 04/21/2018   HGBA1C 8.7 (H) 11/28/2017    Review of Glycemic Control Results for Ryan [name], Ryan [name] (MRN [id number]) as of 04/21/2018 08:59  Ref. Range 04/20/2018 11:03 04/20/2018 12:47 04/20/2018 17:12 04/20/2018 21:01  Glucose-Capillary Latest Ref Range: 70 - 99 mg/dL 492 (H) 329 (H) 266 (H) 305 (H)   Diabetes history: Type 2 DM Outpatient Diabetes medications: Metformin 500 mg QAM, Humalog SSI QID (with max 60 units), Lantus 24 units QD Current orders for Inpatient glycemic control: Lantus 24 units QD, Novolog 0-20 units TID, Metformin 500 mg QAM  Inpatient Diabetes Program Recommendations:    Noted steroid dosing completed.  Last A1C was from May 2019, consider repeating A1C?  Consider adding Novolog 3 units TID (Assuming patient is consuming >50% of meal).  Thanks, Bronson Curb, MSN, RNC-OB Diabetes Coordinator [phone] (8a-5p)

## 2018-04-21 NOTE — Progress Notes (Signed)
Subjective: He feels better.  He is still coughing.  He is not producing much sputum.  The influenza B was from March of this year but somehow showed up with a current lab.  He does not have influenza.  He still struggling with pain.  No other new complaints.  His blood sugar has been up  Objective: Vital signs in last 24 hours: Temp:  [97.6 F (36.4 C)-98.3 F (36.8 C)] 98 F (36.7 C) (10/09 0452) Pulse Rate:  [79-90] 79 (10/09 0452) Resp:  [18-26] 18 (10/09 0452) BP: (117-142)/(77-78) 126/77 (10/09 0452) SpO2:  [92 %-99 %] 99 % (10/09 0726) Weight change:     Intake/Output from previous day: 10/08 0701 - 10/09 0700 In: 2108.3 [P.O.:2008; IV Piggyback:100.3] Out: 2400 [Urine:2400]  PHYSICAL EXAM General appearance: alert, cooperative and no distress Resp: rhonchi bilaterally Cardio: regular rate and rhythm, S1, S2 normal, no murmur, click, rub or gallop GI: soft, non-tender; bowel sounds normal; no masses,  no organomegaly Extremities: extremities normal, atraumatic, no cyanosis or edema  Lab Results:  Results for orders placed or performed during the hospital encounter of 04/19/18 (from the past 48 hour(s))  CBG monitoring, ED     Status: Abnormal   Collection Time: 04/19/18  9:12 PM  Result Value Ref Range   Glucose-Capillary 240 (H) 70 - 99 mg/dL   Comment 1 Notify RN   CBC     Status: Abnormal   Collection Time: 04/19/18  9:53 PM  Result Value Ref Range   WBC 6.7 4.0 - 10.5 K/uL   RBC 4.50 4.22 - 5.81 MIL/uL   Hemoglobin 12.5 (L) 13.0 - 17.0 g/dL   HCT 37.9 (L) 39.0 - 52.0 %   MCV 84.2 78.0 - 100.0 fL   MCH 27.8 26.0 - 34.0 pg   MCHC 33.0 30.0 - 36.0 g/dL   RDW 13.9 11.5 - 15.5 %   Platelets 191 150 - 400 K/uL    Comment: Performed at [facility], [address] [place], [place] [address]  Basic metabolic panel     Status: Abnormal   Collection Time: 04/19/18  9:53 PM  Result Value Ref Range   Sodium 137 135 - 145 mmol/L   Potassium 3.5 3.5 - 5.1 mmol/L    Chloride 96 (L) 98 - 111 mmol/L   CO2 30 22 - 32 mmol/L   Glucose, Bld 216 (H) 70 - 99 mg/dL   BUN 18 6 - 20 mg/dL   Creatinine, Ser 0.95 0.61 - 1.24 mg/dL   Calcium 8.7 (L) 8.9 - 10.3 mg/dL   GFR calc non Af Amer >60 >60 mL/min   GFR calc Af Amer >60 >60 mL/min    Comment: (NOTE) The eGFR has been calculated using the CKD EPI equation. This calculation has not been validated in all clinical situations. eGFR's persistently <60 mL/min signify possible Chronic Kidney Disease.    Anion gap 11 5 - 15    Comment: Performed at [facility], [address] [place], [place] [address]  Brain natriuretic peptide     Status: None   Collection Time: 04/19/18  9:53 PM  Result Value Ref Range   B Natriuretic Peptide 17.0 0.0 - 100.0 pg/mL    Comment: Performed at [facility], [address], [place], [place] [address]  Magnesium     Status: None   Collection Time: 04/19/18  9:53 PM  Result Value Ref Range   Magnesium 2.4 1.7 - 2.4 mg/dL    Comment: Performed at  [facility], [address], [place], [place] [address]  Phosphorus     Status: None   Collection Time: 04/19/18  9:53 PM  Result Value Ref Range   Phosphorus 4.1 2.5 - 4.6 mg/dL    Comment: Performed at [facility], [address], [place], [place] [address]  MRSA PCR Screening     Status: None   Collection Time: 04/20/18  1:07 AM  Result Value Ref Range   MRSA by PCR NEGATIVE NEGATIVE    Comment:        The GeneXpert MRSA Assay (FDA approved for NASAL specimens only), is one component of a comprehensive MRSA colonization surveillance program. It is not intended to diagnose MRSA infection nor to guide or monitor treatment for MRSA infections. Performed at [facility], [address], [place], [place] [address]   CBC WITH DIFFERENTIAL     Status: Abnormal   Collection Time: 04/20/18  4:17 AM  Result Value Ref Range   WBC 5.9 4.0 - 10.5 K/uL   RBC 4.17 (L) 4.22 - 5.81 MIL/uL   Hemoglobin 11.5 (L) 13.0 - 17.0 g/dL   HCT  35.4 (L) 39.0 - 52.0 %   MCV 84.9 80.0 - 100.0 fL   MCH 27.6 26.0 - 34.0 pg   MCHC 32.5 30.0 - 36.0 g/dL   RDW 13.9 11.5 - 15.5 %   Platelets 166 150 - 400 K/uL   Neutrophils Relative % 68 %   Neutro Abs 4.1 1.7 - 7.7 K/uL   Lymphocytes Relative 23 %   Lymphs Abs 1.3 0.7 - 4.0 K/uL   Monocytes Relative 7 %   Monocytes Absolute 0.4 0.1 - 1.0 K/uL   Eosinophils Relative 1 %   Eosinophils Absolute 0.1 0.0 - 0.5 K/uL   Basophils Relative 1 %   Basophils Absolute 0.0 0.0 - 0.1 K/uL    Comment: Performed at [facility], [address] [place], [place] [address]  Basic metabolic panel     Status: Abnormal   Collection Time: 04/20/18  4:17 AM  Result Value Ref Range   Sodium 133 (L) 135 - 145 mmol/L   Potassium 3.8 3.5 - 5.1 mmol/L   Chloride 94 (L) 98 - 111 mmol/L   CO2 28 22 - 32 mmol/L   Glucose, Bld 296 (H) 70 - 99 mg/dL   BUN 15 6 - 20 mg/dL   Creatinine, Ser 0.91 0.61 - 1.24 mg/dL   Calcium 8.2 (L) 8.9 - 10.3 mg/dL   GFR calc non Af Amer >60 >60 mL/min   GFR calc Af Amer >60 >60 mL/min    Comment: (NOTE) The eGFR has been calculated using the CKD EPI equation. This calculation has not been validated in all clinical situations. eGFR's persistently <60 mL/min signify possible Chronic Kidney Disease.    Anion gap 11 5 - 15    Comment: Performed at [facility], [address], [place], [place] [address]  Glucose, capillary     Status: Abnormal   Collection Time: 04/20/18  7:46 AM  Result Value Ref Range   Glucose-Capillary 278 (H) 70 - 99 mg/dL  Glucose, capillary     Status: Abnormal   Collection Time: 04/20/18 11:03 AM  Result Value Ref Range   Glucose-Capillary 492 (H) 70 - 99 mg/dL  Glucose, capillary     Status: Abnormal   Collection Time: 04/20/18 12:47 PM  Result Value Ref Range   Glucose-Capillary 329 (H) 70 - 99 mg/dL  Glucose, capillary  Status: Abnormal   Collection Time: 04/20/18  5:12 PM  Result Value Ref Range   Glucose-Capillary 266 (H) 70 - 99 mg/dL    Comment 1 Notify RN    Comment 2 Document in Chart   Glucose, capillary     Status: Abnormal   Collection Time: 04/20/18  9:01 PM  Result Value Ref Range   Glucose-Capillary 305 (H) 70 - 99 mg/dL   Comment 1 Notify RN    Comment 2 Document in Chart   CBC WITH DIFFERENTIAL     Status: Abnormal   Collection Time: 04/21/18  4:58 AM  Result Value Ref Range   WBC 5.4 4.0 - 10.5 K/uL   RBC 4.54 4.22 - 5.81 MIL/uL   Hemoglobin 12.4 (L) 13.0 - 17.0 g/dL   HCT 39.5 39.0 - 52.0 %   MCV 87.0 80.0 - 100.0 fL   MCH 27.3 26.0 - 34.0 pg   MCHC 31.4 30.0 - 36.0 g/dL   RDW 13.9 11.5 - 15.5 %   Platelets 191 150 - 400 K/uL   nRBC 0.0 0.0 - 0.2 %   Neutrophils Relative % 76 %   Neutro Abs 4.1 1.7 - 7.7 K/uL   Lymphocytes Relative 16 %   Lymphs Abs 0.8 0.7 - 4.0 K/uL   Monocytes Relative 6 %   Monocytes Absolute 0.3 0.1 - 1.0 K/uL   Eosinophils Relative 0 %   Eosinophils Absolute 0.0 0.0 - 0.5 K/uL   Basophils Relative 1 %   Basophils Absolute 0.0 0.0 - 0.1 K/uL   Immature Granulocytes 1 %   Abs Immature Granulocytes 0.05 0.00 - 0.07 K/uL    Comment: Performed at [facility], [address], [place], [place] [address]  Basic metabolic panel     Status: Abnormal   Collection Time: 04/21/18  4:58 AM  Result Value Ref Range   Sodium 135 135 - 145 mmol/L   Potassium 4.2 3.5 - 5.1 mmol/L   Chloride 94 (L) 98 - 111 mmol/L   CO2 29 22 - 32 mmol/L   Glucose, Bld 370 (H) 70 - 99 mg/dL   BUN 18 6 - 20 mg/dL   Creatinine, Ser 1.04 0.61 - 1.24 mg/dL   Calcium 8.6 (L) 8.9 - 10.3 mg/dL   GFR calc non Af Amer >60 >60 mL/min   GFR calc Af Amer >60 >60 mL/min    Comment: (NOTE) The eGFR has been calculated using the CKD EPI equation. This calculation has not been validated in all clinical situations. eGFR's persistently <60 mL/min signify possible Chronic Kidney Disease.    Anion gap 12 5 - 15    Comment: Performed at [facility], [address] [place], [place] [address]  Glucose, capillary      Status: Abnormal   Collection Time: 04/21/18  7:33 AM  Result Value Ref Range   Glucose-Capillary 326 (H) 70 - 99 mg/dL   Comment 1 Notify RN    Comment 2 Document in Chart     ABGS No results for input(s): PHART, PO2ART, TCO2, HCO3 in the last 72 hours.  Invalid input(s): PCO2 CULTURES Recent Results (from the past 240 hour(s))  MRSA PCR Screening     Status: None   Collection Time: 04/20/18  1:07 AM  Result Value Ref Range Status   MRSA by PCR NEGATIVE NEGATIVE Final    Comment:        The GeneXpert MRSA Assay (FDA approved for NASAL specimens only), is one component of a  comprehensive MRSA colonization surveillance program. It is not intended to diagnose MRSA infection nor to guide or monitor treatment for MRSA infections. Performed at [facility], [address], [place], [place] [address]    Studies/Results: Dg Chest 2 View  Result Date: 04/19/2018 CLINICAL DATA:  Shortness of breath EXAM: CHEST - 2 VIEW COMPARISON:  Nov 27, 2017 FINDINGS: There are multiple bullae, most notably in the right upper lobe. There are multiple areas of scarring. There is no edema or consolidation. The heart size is normal. Pulmonary vascularity reflects the underlying bullous disease in the upper lobes. No adenopathy. There is evidence of an old fracture of the anterior left fifth rib. 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 No edema or consolidation. Stable cardiac silhouette. No evident adenopathy. Emphysema (ICD10-J43.9). Electronically Signed   By: Lowella Grip III M.D.   On: 04/19/2018 21:39    Medications:  Prior to Admission:  Medications Prior to Admission  Medication Sig Dispense Refill Last Dose  . albuterol (VENTOLIN HFA) 108 (90 BASE) MCG/ACT inhaler Inhale 2 puffs into the lungs every 6 (six) hours as needed for wheezing or shortness of breath.   04/19/2018 at Unknown time  . azithromycin (ZITHROMAX) 500 MG tablet Take 1 tablet (500 mg total) by  mouth daily. 30 tablet 11 04/19/2018 at Unknown time  . baclofen (LIORESAL) 20 MG tablet Take 20 mg by mouth 3 (three) times daily.    04/19/2018 at Unknown time  . benzonatate (TESSALON) 200 MG capsule Take 200 mg by mouth 3 (three) times daily.   04/19/2018 at Unknown time  . budesonide (PULMICORT) 0.25 MG/2ML nebulizer solution Take 2 mLs (0.25 mg total) by nebulization 2 (two) times daily. 60 mL 12 04/19/2018 at Unknown time  . CARTIA XT 120 MG 24 hr capsule Take 1 capsule (120 mg total) by mouth daily. 7 capsule 0 04/19/2018 at Unknown time  . ethambutol (MYAMBUTOL) 400 MG tablet Take 3 tablets (1,200 mg total) by mouth daily. 90 tablet 11 04/19/2018 at Unknown time  . fexofenadine (ALLEGRA) 180 MG tablet Take 180 mg by mouth daily.   04/19/2018 at Unknown time  . Fluticasone-Umeclidin-Vilant (TRELEGY ELLIPTA) 100-62.5-25 MCG/INH AEPB Inhale 1 puff into the lungs daily. Rinse mouth after you use this 1 each 12 04/19/2018 at Unknown time  . furosemide (LASIX) 40 MG tablet Take 1 tablet (40 mg total) by mouth 2 (two) times daily. 60 tablet 12 04/19/2018 at Unknown time  . guaifenesin (HUMIBID E) 400 MG TABS tablet Take 400 mg by mouth every 4 (four) hours.   04/19/2018 at Unknown time  . HUMALOG KWIKPEN 100 UNIT/ML KiwkPen Sliding scale  0 04/19/2018 at Unknown time  . insulin glargine (LANTUS) 100 UNIT/ML injection Inject 0.24 mLs (24 Units total) into the skin daily. 24 units daily (Patient taking differently: Inject 26 Units into the skin daily. 24 units daily) 10 mL 11 04/19/2018 at Unknown time  . ipratropium (ATROVENT) 0.02 % nebulizer solution VVN TID WITH LEVALBUTEROL  5 04/19/2018 at Unknown time  . LORazepam (ATIVAN) 0.5 MG tablet Take 1 tablet (0.5 mg total) by mouth 2 (two) times daily as needed for anxiety or sleep. (Patient taking differently: Take 0.5 mg by mouth 4 (four) times daily as needed for anxiety or sleep. ) 20 tablet 0 04/19/2018 at Unknown time  . metFORMIN (GLUCOPHAGE) 500 MG tablet  Take 1 tablet (500 mg total) by mouth daily with breakfast. 30 tablet 3 04/19/2018 at Unknown time  . montelukast (  SINGULAIR) 10 MG tablet Take 10 mg by mouth at bedtime.   Past Week at Unknown time  . nitroGLYCERIN (NITROSTAT) 0.4 MG SL tablet Place 1 tablet (0.4 mg total) under the tongue every 5 (five) minutes as needed for chest pain. 25 tablet 4 unknown  . omeprazole (PRILOSEC) 20 MG capsule Take 20 mg by mouth daily.   04/19/2018 at Unknown time  . oxyCODONE-acetaminophen (PERCOCET) 10-325 MG tablet Take 1 tablet by mouth every 4 (four) hours as needed for pain.   04/19/2018 at Unknown time  . potassium chloride SA (K-DUR,KLOR-CON) 20 MEQ tablet Take 20 mEq by mouth daily as needed (swelling).   04/19/2018 at Unknown time  . predniSONE (DELTASONE) 10 MG tablet Take 2 daily for 1 week then 1 daily (Patient taking differently: Take 10 mg by mouth daily with breakfast. Take 2 daily for 1 week then 1 daily) 60 tablet 5 04/19/2018 at Unknown time  . rifampin (RIFADIN) 300 MG capsule Take 2 capsules (600 mg total) by mouth daily. 60 capsule 11 04/19/2018 at Unknown time  . rosuvastatin (CRESTOR) 10 MG tablet TK 1 T PO QD  11 04/19/2018 at Unknown time  . fluticasone (FLONASE) 50 MCG/ACT nasal spray SHAKE LQ AND U 2 SPRAYS IEN QD  5   . insulin lispro (HUMALOG) 100 UNIT/ML injection Take by sliding scale provided by hospital maximum amount daily is 60 units.  Check blood sugar 4 times a day 10 mL 11 Taking   Scheduled: . azithromycin  500 mg Oral Daily  . baclofen  20 mg Oral TID  . benzonatate  200 mg Oral TID  . diltiazem  120 mg Oral Daily  . ethambutol  1,200 mg Oral Daily  . furosemide  40 mg Oral BID  . guaiFENesin  1,200 mg Oral BID  . heparin  5,000 Units Subcutaneous Q8H  . insulin aspart  0-20 Units Subcutaneous TID WC  . insulin glargine  24 Units Subcutaneous Daily  . ipratropium  0.5 mg Nebulization Q6H  . levalbuterol  1.25 mg Nebulization Q6H  . mouth rinse  15 mL Mouth Rinse BID  .  metFORMIN  500 mg Oral Q breakfast  . montelukast  10 mg Oral QHS  . pantoprazole  40 mg Oral Daily  . pneumococcal 23 valent vaccine  0.5 mL Intramuscular Tomorrow-1000  . potassium chloride  40 mEq Oral Daily  . rifampin  600 mg Oral Daily   Continuous: . cefTRIAXone (ROCEPHIN)  IV Stopped (04/20/18 1330)   WUJ:[id number] **OR** acetaminophen, HYDROmorphone, levalbuterol, LORazepam, nitroGLYCERIN, ondansetron **OR** ondansetron (ZOFRAN) IV, oxyCODONE-acetaminophen **AND** oxyCODONE, oxyCODONE-acetaminophen **AND** oxyCODONE, phenol  Assesment: He was admitted with COPD exacerbation.  He has acute on chronic hypoxic respiratory failure.  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  He has diabetes and that is not totally controlled.  He has chronic pain and that will be treated. Principal Problem:   COPD with acute exacerbation ([place]) Active Problems:   Hyperlipidemia   GERD with stricture   ASCVD (arteriosclerotic cardiovascular disease)   Type 2 diabetes mellitus without complication (HCC)   Paroxysmal atrial fibrillation (HCC)   MAI (mycobacterium avium-intracellulare) ([place])   Respiratory failure, acute-on-chronic ([place])    Plan: Continue treatments    LOS: 1 day   Darris Carachure L 04/21/2018, 9:02 AM

## 2018-04-21 NOTE — Care Management Note (Signed)
Case Management Note  Patient Details  Name: Ryan [name] MRN: [id number] Date of Birth: Jun 05, 1961  Subjective/Objective:     COPD exacerbation.  He is still listed as active with [facility]. He has home oxygen and neb machine. He has had [facility] in the past.  This is his third admission in six months.              Action/Plan: CM following for needs. ? Home Health again, not sure that home health is helping to prevent admissions due to disease state.   Expected Discharge Date:     unk             Expected Discharge Plan:     In-House Referral:     Discharge planning Services  CM Consult  Post Acute Care Choice:    Choice offered to:     DME Arranged:    DME Agency:     HH Arranged:    HH Agency:     Status of Service:  In process, will continue to follow  If discussed at Long Length of Stay Meetings, dates discussed:    Additional Comments:  Arlesia Kiel, Chauncey Reading, RN 04/21/2018, 9:19 AM

## 2018-04-22 ENCOUNTER — Inpatient Hospital Stay (HOSPITAL_COMMUNITY): Payer: Medicare Other

## 2018-04-22 LAB — CBC WITH DIFFERENTIAL/PLATELET
ABS IMMATURE GRANULOCYTES: 0.05 10*3/uL (ref 0.00–0.07)
BASOS ABS: 0.1 10*3/uL (ref 0.0–0.1)
Basophils Relative: 1 %
Eosinophils Absolute: 0.3 10*3/uL (ref 0.0–0.5)
Eosinophils Relative: 4 %
HCT: 42.3 % (ref 39.0–52.0)
HEMOGLOBIN: 13.3 g/dL (ref 13.0–17.0)
IMMATURE GRANULOCYTES: 1 %
LYMPHS PCT: 22 %
Lymphs Abs: 1.9 10*3/uL (ref 0.7–4.0)
MCH: 27.7 pg (ref 26.0–34.0)
MCHC: 31.4 g/dL (ref 30.0–36.0)
MCV: 87.9 fL (ref 80.0–100.0)
Monocytes Absolute: 1 10*3/uL (ref 0.1–1.0)
Monocytes Relative: 12 %
NEUTROS ABS: 5.4 10*3/uL (ref 1.7–7.7)
NRBC: 0 % (ref 0.0–0.2)
Neutrophils Relative %: 60 %
Platelets: 200 10*3/uL (ref 150–400)
RBC: 4.81 MIL/uL (ref 4.22–5.81)
RDW: 14 % (ref 11.5–15.5)
WBC: 8.7 10*3/uL (ref 4.0–10.5)

## 2018-04-22 LAB — BASIC METABOLIC PANEL
Anion gap: 12 (ref 5–15)
BUN: 20 mg/dL (ref 6–20)
CHLORIDE: 94 mmol/L — AB (ref 98–111)
CO2: 32 mmol/L (ref 22–32)
CREATININE: 1.16 mg/dL (ref 0.61–1.24)
Calcium: 8.8 mg/dL — ABNORMAL LOW (ref 8.9–10.3)
GFR calc non Af Amer: >60 mL/min (ref >60)
Glucose, Bld: 254 mg/dL — ABNORMAL HIGH (ref 70–99)
POTASSIUM: 3.8 mmol/L (ref 3.5–5.1)
SODIUM: 138 mmol/L (ref 135–145)

## 2018-04-22 LAB — GLUCOSE, CAPILLARY
GLUCOSE-CAPILLARY: 270 mg/dL — AB (ref 70–99)
GLUCOSE-CAPILLARY: 255 mg/dL — AB (ref 70–99)
GLUCOSE-CAPILLARY: 286 mg/dL — AB (ref 70–99)
GLUCOSE-CAPILLARY: 109 mg/dL — AB (ref 70–99)

## 2018-04-22 MED ORDER — IOPAMIDOL (ISOVUE-300) INJECTION 61%
100.0000 mL | Freq: Once | INTRAVENOUS | Status: AC | PRN
  Administered 2018-04-22: 100 mL via INTRAVENOUS

## 2018-04-22 MED ORDER — INSULIN ASPART 100 UNIT/ML ~~LOC~~ SOLN
4.0000 [IU] | Freq: Three times a day (TID) | SUBCUTANEOUS | Status: DC
  Administered 2018-04-22 – 2018-04-25 (×10): 4 [IU] via SUBCUTANEOUS

## 2018-04-22 MED ORDER — HYDROMORPHONE HCL 1 MG/ML IJ SOLN
1.0000 mg | INTRAMUSCULAR | Status: DC | PRN
  Administered 2018-04-22: 1 mg via INTRAVENOUS
  Filled 2018-04-22: qty 1

## 2018-04-22 MED ORDER — INSULIN GLARGINE 100 UNIT/ML ~~LOC~~ SOLN
32.0000 [IU] | Freq: Every day | SUBCUTANEOUS | Status: DC
  Administered 2018-04-22 – 2018-04-23 (×2): 32 [IU] via SUBCUTANEOUS
  Filled 2018-04-22 (×5): qty 0.32

## 2018-04-22 MED ORDER — METHYLPREDNISOLONE SODIUM SUCC 40 MG IJ SOLR
40.0000 mg | Freq: Two times a day (BID) | INTRAMUSCULAR | Status: DC
  Administered 2018-04-22 – 2018-04-24 (×5): 40 mg via INTRAVENOUS
  Filled 2018-04-22 (×5): qty 1

## 2018-04-22 MED ORDER — HYDROMORPHONE HCL 1 MG/ML IJ SOLN
0.5000 mg | INTRAMUSCULAR | Status: DC | PRN
  Administered 2018-04-22 – 2018-04-25 (×13): 0.5 mg via INTRAVENOUS
  Filled 2018-04-22 (×14): qty 0.5

## 2018-04-22 NOTE — Progress Notes (Addendum)
Subjective: He says he is having more trouble now with abdominal pain.  He does not have any other new complaints.  His breathing is better.  He is still coughing.  He is complaining of pain in his right arm where he had Pneumovax  Objective: Vital signs in last 24 hours: Temp:  [98.3 F (36.8 C)-98.7 F (37.1 C)] 98.3 F (36.8 C) (10/10 0621) Pulse Rate:  [92-97] 96 (10/10 0621) Resp:  [20-22] 22 (10/10 0621) BP: (120-134)/(76-113) 120/83 (10/10 0621) SpO2:  [93 %-99 %] 94 % (10/10 0717) Weight change:  Last BM Date: 04/21/18  Intake/Output from previous day: 10/09 0701 - 10/10 0700 In: 95.5 [IV Piggyback:95.5] Out: 900 [Urine:900]  PHYSICAL EXAM General appearance: alert, cooperative and mild distress Resp: rhonchi bilaterally Cardio: regular rate and rhythm, S1, S2 normal, no murmur, click, rub or gallop GI: He has left upper quadrant tenderness and fullness Extremities: extremities normal, atraumatic, no cyanosis or edema  Lab Results:  Results for orders placed or performed during the hospital encounter of 04/19/18 (from the past 48 hour(s))  Glucose, capillary     Status: Abnormal   Collection Time: 04/20/18 11:03 AM  Result Value Ref Range   Glucose-Capillary 492 (H) 70 - 99 mg/dL  Glucose, capillary     Status: Abnormal   Collection Time: 04/20/18 12:47 PM  Result Value Ref Range   Glucose-Capillary 329 (H) 70 - 99 mg/dL  Glucose, capillary     Status: Abnormal   Collection Time: 04/20/18  5:12 PM  Result Value Ref Range   Glucose-Capillary 266 (H) 70 - 99 mg/dL   Comment 1 Notify RN    Comment 2 Document in Chart   Glucose, capillary     Status: Abnormal   Collection Time: 04/20/18  9:01 PM  Result Value Ref Range   Glucose-Capillary 305 (H) 70 - 99 mg/dL   Comment 1 Notify RN    Comment 2 Document in Chart   CBC WITH DIFFERENTIAL     Status: Abnormal   Collection Time: 04/21/18  4:58 AM  Result Value Ref Range   WBC 5.4 4.0 - 10.5 K/uL   RBC 4.54 4.22  - 5.81 MIL/uL   Hemoglobin 12.4 (L) 13.0 - 17.0 g/dL   HCT 39.5 39.0 - 52.0 %   MCV 87.0 80.0 - 100.0 fL   MCH 27.3 26.0 - 34.0 pg   MCHC 31.4 30.0 - 36.0 g/dL   RDW 13.9 11.5 - 15.5 %   Platelets 191 150 - 400 K/uL   nRBC 0.0 0.0 - 0.2 %   Neutrophils Relative % 76 %   Neutro Abs 4.1 1.7 - 7.7 K/uL   Lymphocytes Relative 16 %   Lymphs Abs 0.8 0.7 - 4.0 K/uL   Monocytes Relative 6 %   Monocytes Absolute 0.3 0.1 - 1.0 K/uL   Eosinophils Relative 0 %   Eosinophils Absolute 0.0 0.0 - 0.5 K/uL   Basophils Relative 1 %   Basophils Absolute 0.0 0.0 - 0.1 K/uL   Immature Granulocytes 1 %   Abs Immature Granulocytes 0.05 0.00 - 0.07 K/uL    Comment: Performed at [facility], [address], [place], [place] [address]  Basic metabolic panel     Status: Abnormal   Collection Time: 04/21/18  4:58 AM  Result Value Ref Range   Sodium 135 135 - 145 mmol/L   Potassium 4.2 3.5 - 5.1 mmol/L   Chloride 94 (L) 98 - 111 mmol/L   CO2 29  22 - 32 mmol/L   Glucose, Bld 370 (H) 70 - 99 mg/dL   BUN 18 6 - 20 mg/dL   Creatinine, Ser 1.04 0.61 - 1.24 mg/dL   Calcium 8.6 (L) 8.9 - 10.3 mg/dL   GFR calc non Af Amer >60 >60 mL/min   GFR calc Af Amer >60 >60 mL/min    Comment: (NOTE) The eGFR has been calculated using the CKD EPI equation. This calculation has not been validated in all clinical situations. eGFR's persistently <60 mL/min signify possible Chronic Kidney Disease.    Anion gap 12 5 - 15    Comment: Performed at [facility], [address], [place], [place] [address]  Glucose, capillary     Status: Abnormal   Collection Time: 04/21/18  7:33 AM  Result Value Ref Range   Glucose-Capillary 326 (H) 70 - 99 mg/dL   Comment 1 Notify RN    Comment 2 Document in Chart   Glucose, capillary     Status: Abnormal   Collection Time: 04/21/18 11:30 AM  Result Value Ref Range   Glucose-Capillary 225 (H) 70 - 99 mg/dL   Comment 1 Notify RN    Comment 2 Document in Chart   Glucose, capillary      Status: Abnormal   Collection Time: 04/21/18  4:19 PM  Result Value Ref Range   Glucose-Capillary 204 (H) 70 - 99 mg/dL   Comment 1 Notify RN    Comment 2 Document in Chart   Glucose, capillary     Status: Abnormal   Collection Time: 04/21/18 10:22 PM  Result Value Ref Range   Glucose-Capillary 297 (H) 70 - 99 mg/dL   Comment 1 Notify RN    Comment 2 Document in Chart   CBC WITH DIFFERENTIAL     Status: None   Collection Time: 04/22/18  4:37 AM  Result Value Ref Range   WBC 8.7 4.0 - 10.5 K/uL   RBC 4.81 4.22 - 5.81 MIL/uL   Hemoglobin 13.3 13.0 - 17.0 g/dL   HCT 42.3 39.0 - 52.0 %   MCV 87.9 80.0 - 100.0 fL   MCH 27.7 26.0 - 34.0 pg   MCHC 31.4 30.0 - 36.0 g/dL   RDW 14.0 11.5 - 15.5 %   Platelets 200 150 - 400 K/uL   nRBC 0.0 0.0 - 0.2 %   Neutrophils Relative % 60 %   Neutro Abs 5.4 1.7 - 7.7 K/uL   Lymphocytes Relative 22 %   Lymphs Abs 1.9 0.7 - 4.0 K/uL   Monocytes Relative 12 %   Monocytes Absolute 1.0 0.1 - 1.0 K/uL   Eosinophils Relative 4 %   Eosinophils Absolute 0.3 0.0 - 0.5 K/uL   Basophils Relative 1 %   Basophils Absolute 0.1 0.0 - 0.1 K/uL   Immature Granulocytes 1 %   Abs Immature Granulocytes 0.05 0.00 - 0.07 K/uL    Comment: Performed at [facility], [address], [place], [place] [address]  Basic metabolic panel     Status: Abnormal   Collection Time: 04/22/18  4:37 AM  Result Value Ref Range   Sodium 138 135 - 145 mmol/L   Potassium 3.8 3.5 - 5.1 mmol/L   Chloride 94 (L) 98 - 111 mmol/L   CO2 32 22 - 32 mmol/L   Glucose, Bld 254 (H) 70 - 99 mg/dL   BUN 20 6 - 20 mg/dL   Creatinine, Ser 1.16 0.61 - 1.24 mg/dL   Calcium 8.8 (L) 8.9 -  10.3 mg/dL   GFR calc non Af Amer >60 >60 mL/min   GFR calc Af Amer >60 >60 mL/min    Comment: (NOTE) The eGFR has been calculated using the CKD EPI equation. This calculation has not been validated in all clinical situations. eGFR's persistently <60 mL/min signify possible Chronic Kidney Disease.    Anion gap  12 5 - 15    Comment: Performed at [facility], [address], [place], [place] [address]  Glucose, capillary     Status: Abnormal   Collection Time: 04/22/18  7:32 AM  Result Value Ref Range   Glucose-Capillary 270 (H) 70 - 99 mg/dL   Comment 1 Notify RN    Comment 2 Document in Chart     ABGS No results for input(s): PHART, PO2ART, TCO2, HCO3 in the last 72 hours.  Invalid input(s): PCO2 CULTURES Recent Results (from the past 240 hour(s))  MRSA PCR Screening     Status: None   Collection Time: 04/20/18  1:07 AM  Result Value Ref Range Status   MRSA by PCR NEGATIVE NEGATIVE Final    Comment:        The GeneXpert MRSA Assay (FDA approved for NASAL specimens only), is one component of a comprehensive MRSA colonization surveillance program. It is not intended to diagnose MRSA infection nor to guide or monitor treatment for MRSA infections. Performed at [facility], [address], [place], [place] [address]    Studies/Results: No results found.  Medications:  Prior to Admission:  Medications Prior to Admission  Medication Sig Dispense Refill Last Dose  . albuterol (VENTOLIN HFA) 108 (90 BASE) MCG/ACT inhaler Inhale 2 puffs into the lungs every 6 (six) hours as needed for wheezing or shortness of breath.   04/19/2018 at Unknown time  . azithromycin (ZITHROMAX) 500 MG tablet Take 1 tablet (500 mg total) by mouth daily. 30 tablet 11 04/19/2018 at Unknown time  . baclofen (LIORESAL) 20 MG tablet Take 20 mg by mouth 3 (three) times daily.    04/19/2018 at Unknown time  . benzonatate (TESSALON) 200 MG capsule Take 200 mg by mouth 3 (three) times daily.   04/19/2018 at Unknown time  . budesonide (PULMICORT) 0.25 MG/2ML nebulizer solution Take 2 mLs (0.25 mg total) by nebulization 2 (two) times daily. 60 mL 12 04/19/2018 at Unknown time  . CARTIA XT 120 MG 24 hr capsule Take 1 capsule (120 mg total) by mouth daily. 7 capsule 0 04/19/2018 at Unknown time  . ethambutol (MYAMBUTOL) 400 MG  tablet Take 3 tablets (1,200 mg total) by mouth daily. 90 tablet 11 04/19/2018 at Unknown time  . fexofenadine (ALLEGRA) 180 MG tablet Take 180 mg by mouth daily.   04/19/2018 at Unknown time  . Fluticasone-Umeclidin-Vilant (TRELEGY ELLIPTA) 100-62.5-25 MCG/INH AEPB Inhale 1 puff into the lungs daily. Rinse mouth after you use this 1 each 12 04/19/2018 at Unknown time  . furosemide (LASIX) 40 MG tablet Take 1 tablet (40 mg total) by mouth 2 (two) times daily. 60 tablet 12 04/19/2018 at Unknown time  . guaifenesin (HUMIBID E) 400 MG TABS tablet Take 400 mg by mouth every 4 (four) hours.   04/19/2018 at Unknown time  . HUMALOG KWIKPEN 100 UNIT/ML KiwkPen Sliding scale  0 04/19/2018 at Unknown time  . insulin glargine (LANTUS) 100 UNIT/ML injection Inject 0.24 mLs (24 Units total) into the skin daily. 24 units daily (Patient taking differently: Inject 26 Units into the skin daily. 24 units daily) 10 mL 11 04/19/2018 at  Unknown time  . ipratropium (ATROVENT) 0.02 % nebulizer solution VVN TID WITH LEVALBUTEROL  5 04/19/2018 at Unknown time  . LORazepam (ATIVAN) 0.5 MG tablet Take 1 tablet (0.5 mg total) by mouth 2 (two) times daily as needed for anxiety or sleep. (Patient taking differently: Take 0.5 mg by mouth 4 (four) times daily as needed for anxiety or sleep. ) 20 tablet 0 04/19/2018 at Unknown time  . metFORMIN (GLUCOPHAGE) 500 MG tablet Take 1 tablet (500 mg total) by mouth daily with breakfast. 30 tablet 3 04/19/2018 at Unknown time  . montelukast (SINGULAIR) 10 MG tablet Take 10 mg by mouth at bedtime.   Past Week at Unknown time  . nitroGLYCERIN (NITROSTAT) 0.4 MG SL tablet Place 1 tablet (0.4 mg total) under the tongue every 5 (five) minutes as needed for chest pain. 25 tablet 4 unknown  . omeprazole (PRILOSEC) 20 MG capsule Take 20 mg by mouth daily.   04/19/2018 at Unknown time  . oxyCODONE-acetaminophen (PERCOCET) 10-325 MG tablet Take 1 tablet by mouth every 4 (four) hours as needed for pain.   04/19/2018  at Unknown time  . potassium chloride SA (K-DUR,KLOR-CON) 20 MEQ tablet Take 20 mEq by mouth daily as needed (swelling).   04/19/2018 at Unknown time  . predniSONE (DELTASONE) 10 MG tablet Take 2 daily for 1 week then 1 daily (Patient taking differently: Take 10 mg by mouth daily with breakfast. Take 2 daily for 1 week then 1 daily) 60 tablet 5 04/19/2018 at Unknown time  . rifampin (RIFADIN) 300 MG capsule Take 2 capsules (600 mg total) by mouth daily. 60 capsule 11 04/19/2018 at Unknown time  . rosuvastatin (CRESTOR) 10 MG tablet TK 1 T PO QD  11 04/19/2018 at Unknown time  . fluticasone (FLONASE) 50 MCG/ACT nasal spray SHAKE LQ AND U 2 SPRAYS IEN QD  5   . insulin lispro (HUMALOG) 100 UNIT/ML injection Take by sliding scale provided by hospital maximum amount daily is 60 units.  Check blood sugar 4 times a day 10 mL 11 Taking   Scheduled: . azithromycin  500 mg Oral Daily  . baclofen  20 mg Oral TID  . benzonatate  200 mg Oral TID  . diltiazem  120 mg Oral Daily  . ethambutol  1,200 mg Oral Daily  . furosemide  40 mg Oral BID  . guaiFENesin  1,200 mg Oral BID  . heparin  5,000 Units Subcutaneous Q8H  . insulin aspart  0-20 Units Subcutaneous TID WC  . insulin glargine  28 Units Subcutaneous Daily  . ipratropium  0.5 mg Nebulization Q6H  . levalbuterol  1.25 mg Nebulization Q6H  . mouth rinse  15 mL Mouth Rinse BID  . metFORMIN  500 mg Oral Q breakfast  . methylPREDNISolone (SOLU-MEDROL) injection  40 mg Intravenous Q12H  . montelukast  10 mg Oral QHS  . pantoprazole  40 mg Oral Daily  . potassium chloride  40 mEq Oral Daily  . rifampin  600 mg Oral Daily   Continuous: . cefTRIAXone (ROCEPHIN)  IV Stopped (04/21/18 1343)   Anti-infectives (From admission, onward)   Start     Dose/Rate Route Frequency Ordered Stop   04/20/18 1200  cefTRIAXone (ROCEPHIN) 1 g in sodium chloride 0.9 % 100 mL IVPB     1 g 200 mL/hr over 30 Minutes Intravenous Every 24 hours 04/20/18 1111     04/20/18  1000  azithromycin (ZITHROMAX) tablet 500 mg     500 mg Oral Daily  04/20/18 0411     04/20/18 1000  ethambutol (MYAMBUTOL) tablet 1,200 mg     1,200 mg Oral Daily 04/20/18 0411     04/20/18 1000  rifampin (RIFADIN) capsule 600 mg     600 mg Oral Daily 04/20/18 0411     04/20/18 1000  oseltamivir (TAMIFLU) capsule 75 mg  Status:  Discontinued     75 mg Oral 2 times daily 04/20/18 0723 04/20/18 1112      Assesment: He is admitted with COPD exacerbation.  He has acute on chronic respiratory failure with hypoxia and that is getting better.  Today he is complaining of abdominal pain which is new.  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  He is had trouble with diastolic heart failure but does not seem to be having issues now  He has diabetes which is not controlled so I am going to add mealtime insulin and increase his Lantus  He has abdominal pain and is not clear what that is from Principal Problem:   COPD with acute exacerbation (HCC) Active Problems:   Hyperlipidemia   GERD with stricture   ASCVD (arteriosclerotic cardiovascular disease)   Type 2 diabetes mellitus without complication (HCC)   Paroxysmal atrial fibrillation (HCC)   MAI (mycobacterium avium-intracellulare) ([place])   Respiratory failure, acute-on-chronic ([place])    Plan: Check CT abdomen.  Continue other treatments.    LOS: 2 days   Marlena Barbato L 04/22/2018, 8:52 AM

## 2018-04-23 LAB — GLUCOSE, CAPILLARY
GLUCOSE-CAPILLARY: 310 mg/dL — AB (ref 70–99)
GLUCOSE-CAPILLARY: 193 mg/dL — AB (ref 70–99)
Glucose-Capillary: 242 mg/dL — ABNORMAL HIGH (ref 70–99)
Glucose-Capillary: 222 mg/dL — ABNORMAL HIGH (ref 70–99)

## 2018-04-23 LAB — CBC WITH DIFFERENTIAL/PLATELET
ABS IMMATURE GRANULOCYTES: 0.05 10*3/uL (ref 0.00–0.07)
BASOS ABS: 0.1 10*3/uL (ref 0.0–0.1)
BASOS PCT: 1 %
EOS ABS: 0.2 10*3/uL (ref 0.0–0.5)
Eosinophils Relative: 3 %
HEMATOCRIT: 39.1 % (ref 39.0–52.0)
Hemoglobin: 12.4 g/dL — ABNORMAL LOW (ref 13.0–17.0)
IMMATURE GRANULOCYTES: 1 %
LYMPHS ABS: 1.9 10*3/uL (ref 0.7–4.0)
Lymphocytes Relative: 27 %
MCH: 27.8 pg (ref 26.0–34.0)
MCHC: 31.7 g/dL (ref 30.0–36.0)
MCV: 87.7 fL (ref 80.0–100.0)
Monocytes Absolute: 0.7 10*3/uL (ref 0.1–1.0)
Monocytes Relative: 10 %
NEUTROS PCT: 58 %
NRBC: 0 % (ref 0.0–0.2)
Neutro Abs: 4.1 10*3/uL (ref 1.7–7.7)
PLATELETS: 208 10*3/uL (ref 150–400)
RBC: 4.46 MIL/uL (ref 4.22–5.81)
RDW: 14 % (ref 11.5–15.5)
WBC: 7.1 10*3/uL (ref 4.0–10.5)

## 2018-04-23 MED ORDER — FLUTICASONE FUROATE-VILANTEROL 100-25 MCG/INH IN AEPB
1.0000 | INHALATION_SPRAY | Freq: Every day | RESPIRATORY_TRACT | Status: DC
  Administered 2018-04-24 – 2018-04-25 (×2): 1 via RESPIRATORY_TRACT
  Filled 2018-04-23: qty 28

## 2018-04-23 MED ORDER — FLUTICASONE PROPIONATE 50 MCG/ACT NA SUSP
2.0000 | Freq: Every day | NASAL | Status: DC
  Administered 2018-04-23 – 2018-04-25 (×3): 2 via NASAL
  Filled 2018-04-23: qty 16

## 2018-04-23 MED ORDER — HYDROCODONE-HOMATROPINE 5-1.5 MG/5ML PO SYRP
5.0000 mL | ORAL_SOLUTION | ORAL | Status: DC | PRN
  Administered 2018-04-23 – 2018-04-25 (×10): 5 mL via ORAL
  Filled 2018-04-23 (×10): qty 5

## 2018-04-23 NOTE — Care Management Important Message (Signed)
Important Message  Patient Details  Name: ABHISHEK LEVESQUE MRN: [id number] Date of Birth: 02/17/1961   Medicare Important Message Given:  Yes    [name] 04/23/2018, 10:33 AM

## 2018-04-23 NOTE — Progress Notes (Signed)
Inpatient Diabetes Program Recommendations  AACE/ADA: New Consensus Statement on Inpatient Glycemic Control (2015)  Target Ranges:  Prepandial:   less than 140 mg/dL      Peak postprandial:   less than 180 mg/dL (1-2 hours)      Critically ill patients:  140 - 180 mg/dL   Lab Results  Component Value Date   GLUCAP 310 (H) 04/23/2018   HGBA1C 8.7 (H) 11/28/2017    Review of Glycemic Control Results for MYER, BOHLMAN (MRN [id number]) as of 04/23/2018 12:19  Ref. Range 04/22/2018 16:45 04/22/2018 21:27 04/23/2018 07:43 04/23/2018 11:22  Glucose-Capillary Latest Ref Range: 70 - 99 mg/dL 286 (H) 109 (H) 242 (H) 310 (H)   Diabetes history:Type 2 DM Outpatient Diabetes medications:Metformin 500 mg QAM, Humalog SSI QID (with max 60 units), Lantus 24 units QD Current orders for Inpatient glycemic control:Lantus 32 units QD, Novolog 0-20 units TID, Metformin 500 mg QAM, Novolog 4 units TID Solumedrol 40 mg Q12H  Inpatient Diabetes Program Recommendations:    Patient has received >50 units of short acting insulin in last 24 hours. Given that and his AM FSBS, consider increasing Lantus 38 units QD.   In the setting of steroids, also consider increasing meal coverage to Novolog 6 units TID (assuming patient continues to consume >50% of meal).   Thanks, Bronson Curb, MSN, RNC-OB Diabetes Coordinator [phone] (8a-5p)

## 2018-04-23 NOTE — Progress Notes (Signed)
Subjective: He says he feels better.  He still has abdominal pain but his CT did not show anything acute.  His bowels are moving.  His breathing is better but he still has a lot of nasal and sinus congestion.  Objective: Vital signs in last 24 hours: Temp:  [98.2 F (36.8 C)-98.4 F (36.9 C)] 98.2 F (36.8 C) (10/11 0555) Pulse Rate:  [100-102] 102 (10/11 0555) Resp:  [20] 20 (10/11 0555) BP: (109-123)/(81-83) 123/81 (10/11 0555) SpO2:  [95 %-96 %] 96 % (10/11 0736) Weight change:  Last BM Date: 04/21/18  Intake/Output from previous day: 10/10 0701 - 10/11 0700 In: 960 [P.O.:960] Out: 1100 [Urine:1100]  PHYSICAL EXAM General appearance: alert, cooperative and mild distress Resp: rhonchi bilaterally Cardio: regular rate and rhythm, S1, S2 normal, no murmur, click, rub or gallop GI: soft, non-tender; bowel sounds normal; no masses,  no organomegaly Extremities: extremities normal, atraumatic, no cyanosis or edema  Lab Results:  Results for orders placed or performed during the hospital encounter of 04/19/18 (from the past 48 hour(s))  Glucose, capillary     Status: Abnormal   Collection Time: 04/21/18 11:30 AM  Result Value Ref Range   Glucose-Capillary 225 (H) 70 - 99 mg/dL   Comment 1 Notify RN    Comment 2 Document in Chart   Glucose, capillary     Status: Abnormal   Collection Time: 04/21/18  4:19 PM  Result Value Ref Range   Glucose-Capillary 204 (H) 70 - 99 mg/dL   Comment 1 Notify RN    Comment 2 Document in Chart   Glucose, capillary     Status: Abnormal   Collection Time: 04/21/18 10:22 PM  Result Value Ref Range   Glucose-Capillary 297 (H) 70 - 99 mg/dL   Comment 1 Notify RN    Comment 2 Document in Chart   CBC WITH DIFFERENTIAL     Status: None   Collection Time: 04/22/18  4:37 AM  Result Value Ref Range   WBC 8.7 4.0 - 10.5 K/uL   RBC 4.81 4.22 - 5.81 MIL/uL   Hemoglobin 13.3 13.0 - 17.0 g/dL   HCT 42.3 39.0 - 52.0 %   MCV 87.9 80.0 - 100.0 fL   MCH  27.7 26.0 - 34.0 pg   MCHC 31.4 30.0 - 36.0 g/dL   RDW 14.0 11.5 - 15.5 %   Platelets 200 150 - 400 K/uL   nRBC 0.0 0.0 - 0.2 %   Neutrophils Relative % 60 %   Neutro Abs 5.4 1.7 - 7.7 K/uL   Lymphocytes Relative 22 %   Lymphs Abs 1.9 0.7 - 4.0 K/uL   Monocytes Relative 12 %   Monocytes Absolute 1.0 0.1 - 1.0 K/uL   Eosinophils Relative 4 %   Eosinophils Absolute 0.3 0.0 - 0.5 K/uL   Basophils Relative 1 %   Basophils Absolute 0.1 0.0 - 0.1 K/uL   Immature Granulocytes 1 %   Abs Immature Granulocytes 0.05 0.00 - 0.07 K/uL    Comment: Performed at [facility], [address], [place], [place] [address]  Basic metabolic panel     Status: Abnormal   Collection Time: 04/22/18  4:37 AM  Result Value Ref Range   Sodium 138 135 - 145 mmol/L   Potassium 3.8 3.5 - 5.1 mmol/L   Chloride 94 (L) 98 - 111 mmol/L   CO2 32 22 - 32 mmol/L   Glucose, Bld 254 (H) 70 - 99 mg/dL   BUN 20 6 -  20 mg/dL   Creatinine, Ser 1.16 0.61 - 1.24 mg/dL   Calcium 8.8 (L) 8.9 - 10.3 mg/dL   GFR calc non Af Amer >60 >60 mL/min   GFR calc Af Amer >60 >60 mL/min    Comment: (NOTE) The eGFR has been calculated using the CKD EPI equation. This calculation has not been validated in all clinical situations. eGFR's persistently <60 mL/min signify possible Chronic Kidney Disease.    Anion gap 12 5 - 15    Comment: Performed at [facility], [address], [place], [place] [address]  Glucose, capillary     Status: Abnormal   Collection Time: 04/22/18  7:32 AM  Result Value Ref Range   Glucose-Capillary 270 (H) 70 - 99 mg/dL   Comment 1 Notify RN    Comment 2 Document in Chart   Glucose, capillary     Status: Abnormal   Collection Time: 04/22/18 11:15 AM  Result Value Ref Range   Glucose-Capillary 255 (H) 70 - 99 mg/dL   Comment 1 Notify RN    Comment 2 Document in Chart   Glucose, capillary     Status: Abnormal   Collection Time: 04/22/18  4:45 PM  Result Value Ref Range   Glucose-Capillary 286 (H) 70 - 99  mg/dL   Comment 1 Notify RN    Comment 2 Document in Chart   Glucose, capillary     Status: Abnormal   Collection Time: 04/22/18  9:27 PM  Result Value Ref Range   Glucose-Capillary 109 (H) 70 - 99 mg/dL  CBC WITH DIFFERENTIAL     Status: Abnormal   Collection Time: 04/23/18  5:03 AM  Result Value Ref Range   WBC 7.1 4.0 - 10.5 K/uL   RBC 4.46 4.22 - 5.81 MIL/uL   Hemoglobin 12.4 (L) 13.0 - 17.0 g/dL   HCT 39.1 39.0 - 52.0 %   MCV 87.7 80.0 - 100.0 fL   MCH 27.8 26.0 - 34.0 pg   MCHC 31.7 30.0 - 36.0 g/dL   RDW 14.0 11.5 - 15.5 %   Platelets 208 150 - 400 K/uL   nRBC 0.0 0.0 - 0.2 %   Neutrophils Relative % 58 %   Neutro Abs 4.1 1.7 - 7.7 K/uL   Lymphocytes Relative 27 %   Lymphs Abs 1.9 0.7 - 4.0 K/uL   Monocytes Relative 10 %   Monocytes Absolute 0.7 0.1 - 1.0 K/uL   Eosinophils Relative 3 %   Eosinophils Absolute 0.2 0.0 - 0.5 K/uL   Basophils Relative 1 %   Basophils Absolute 0.1 0.0 - 0.1 K/uL   Immature Granulocytes 1 %   Abs Immature Granulocytes 0.05 0.00 - 0.07 K/uL    Comment: Performed at [facility], [address], [place], [place] [address]  Glucose, capillary     Status: Abnormal   Collection Time: 04/23/18  7:43 AM  Result Value Ref Range   Glucose-Capillary 242 (H) 70 - 99 mg/dL    ABGS No results for input(s): PHART, PO2ART, TCO2, HCO3 in the last 72 hours.  Invalid input(s): PCO2 CULTURES Recent Results (from the past 240 hour(s))  MRSA PCR Screening     Status: None   Collection Time: 04/20/18  1:07 AM  Result Value Ref Range Status   MRSA by PCR NEGATIVE NEGATIVE Final    Comment:        The GeneXpert MRSA Assay (FDA approved for NASAL specimens only), is one component of a comprehensive MRSA colonization surveillance program.  It is not intended to diagnose MRSA infection nor to guide or monitor treatment for MRSA infections. Performed at [facility], [address] [place], [place] [address]    Studies/Results: Ct Abdomen Pelvis W  Contrast  Result Date: 04/22/2018 CLINICAL DATA:  57 year old male with acute abdominal pain today. EXAM: CT ABDOMEN AND PELVIS WITH CONTRAST TECHNIQUE: Multidetector CT imaging of the abdomen and pelvis was performed using the standard protocol following bolus administration of intravenous contrast. CONTRAST:  130m ISOVUE-300 IOPAMIDOL (ISOVUE-300) INJECTION 61% COMPARISON:  12/09/2017 and prior CTs FINDINGS: Lower chest: Emphysema and mild LEFT basilar scarring again noted. Hepatobiliary: Mild hepatic steatosis noted. No focal hepatic abnormalities are present. The gallbladder is unremarkable. No biliary dilatation. Pancreas: Unremarkable Spleen: Unremarkable Adrenals/Urinary Tract: The kidneys, adrenal glands and bladder are unremarkable. Stomach/Bowel: Stomach is within normal limits except for small hiatal hernia. Appendix appears normal. No evidence of bowel wall thickening, distention, or inflammatory changes. Vascular/Lymphatic: Aortic atherosclerosis. No enlarged abdominal or pelvic lymph nodes. Reproductive: Prostate is unremarkable. Other: No free fluid, focal collection or pneumoperitoneum. LEFT inguinal hernia surgical changes noted. Musculoskeletal: No acute or suspicious bony abnormalities. IMPRESSION: 1. No evidence of acute abnormality or CT findings to suggest a cause for this patient's abdominal pain. 2. Mild hepatic steatosis number 3. Small hiatal hernia 4. Aortic Atherosclerosis (ICD10-I70.0) and Emphysema (ICD10-J43.9). Electronically Signed   By: JMargarette CanadaM.D.   On: 04/22/2018 14:09    Medications:  Prior to Admission:  Medications Prior to Admission  Medication Sig Dispense Refill Last Dose  . albuterol (VENTOLIN HFA) 108 (90 BASE) MCG/ACT inhaler Inhale 2 puffs into the lungs every 6 (six) hours as needed for wheezing or shortness of breath.   04/19/2018 at Unknown time  . azithromycin (ZITHROMAX) 500 MG tablet Take 1 tablet (500 mg total) by mouth daily. 30 tablet 11  04/19/2018 at Unknown time  . baclofen (LIORESAL) 20 MG tablet Take 20 mg by mouth 3 (three) times daily.    04/19/2018 at Unknown time  . benzonatate (TESSALON) 200 MG capsule Take 200 mg by mouth 3 (three) times daily.   04/19/2018 at Unknown time  . budesonide (PULMICORT) 0.25 MG/2ML nebulizer solution Take 2 mLs (0.25 mg total) by nebulization 2 (two) times daily. 60 mL 12 04/19/2018 at Unknown time  . CARTIA XT 120 MG 24 hr capsule Take 1 capsule (120 mg total) by mouth daily. 7 capsule 0 04/19/2018 at Unknown time  . ethambutol (MYAMBUTOL) 400 MG tablet Take 3 tablets (1,200 mg total) by mouth daily. 90 tablet 11 04/19/2018 at Unknown time  . fexofenadine (ALLEGRA) 180 MG tablet Take 180 mg by mouth daily.   04/19/2018 at Unknown time  . Fluticasone-Umeclidin-Vilant (TRELEGY ELLIPTA) 100-62.5-25 MCG/INH AEPB Inhale 1 puff into the lungs daily. Rinse mouth after you use this 1 each 12 04/19/2018 at Unknown time  . furosemide (LASIX) 40 MG tablet Take 1 tablet (40 mg total) by mouth 2 (two) times daily. 60 tablet 12 04/19/2018 at Unknown time  . guaifenesin (HUMIBID E) 400 MG TABS tablet Take 400 mg by mouth every 4 (four) hours.   04/19/2018 at Unknown time  . HUMALOG KWIKPEN 100 UNIT/ML KiwkPen Sliding scale  0 04/19/2018 at Unknown time  . insulin glargine (LANTUS) 100 UNIT/ML injection Inject 0.24 mLs (24 Units total) into the skin daily. 24 units daily (Patient taking differently: Inject 26 Units into the skin daily. 24 units daily) 10 mL 11 04/19/2018 at Unknown time  . ipratropium (ATROVENT)  0.02 % nebulizer solution VVN TID WITH LEVALBUTEROL  5 04/19/2018 at Unknown time  . LORazepam (ATIVAN) 0.5 MG tablet Take 1 tablet (0.5 mg total) by mouth 2 (two) times daily as needed for anxiety or sleep. (Patient taking differently: Take 0.5 mg by mouth 4 (four) times daily as needed for anxiety or sleep. ) 20 tablet 0 04/19/2018 at Unknown time  . metFORMIN (GLUCOPHAGE) 500 MG tablet Take 1 tablet (500 mg total)  by mouth daily with breakfast. 30 tablet 3 04/19/2018 at Unknown time  . montelukast (SINGULAIR) 10 MG tablet Take 10 mg by mouth at bedtime.   Past Week at Unknown time  . nitroGLYCERIN (NITROSTAT) 0.4 MG SL tablet Place 1 tablet (0.4 mg total) under the tongue every 5 (five) minutes as needed for chest pain. 25 tablet 4 unknown  . omeprazole (PRILOSEC) 20 MG capsule Take 20 mg by mouth daily.   04/19/2018 at Unknown time  . oxyCODONE-acetaminophen (PERCOCET) 10-325 MG tablet Take 1 tablet by mouth every 4 (four) hours as needed for pain.   04/19/2018 at Unknown time  . potassium chloride SA (K-DUR,KLOR-CON) 20 MEQ tablet Take 20 mEq by mouth daily as needed (swelling).   04/19/2018 at Unknown time  . predniSONE (DELTASONE) 10 MG tablet Take 2 daily for 1 week then 1 daily (Patient taking differently: Take 10 mg by mouth daily with breakfast. Take 2 daily for 1 week then 1 daily) 60 tablet 5 04/19/2018 at Unknown time  . rifampin (RIFADIN) 300 MG capsule Take 2 capsules (600 mg total) by mouth daily. 60 capsule 11 04/19/2018 at Unknown time  . rosuvastatin (CRESTOR) 10 MG tablet TK 1 T PO QD  11 04/19/2018 at Unknown time  . fluticasone (FLONASE) 50 MCG/ACT nasal spray SHAKE LQ AND U 2 SPRAYS IEN QD  5   . insulin lispro (HUMALOG) 100 UNIT/ML injection Take by sliding scale provided by hospital maximum amount daily is 60 units.  Check blood sugar 4 times a day 10 mL 11 Taking   Scheduled: . azithromycin  500 mg Oral Daily  . baclofen  20 mg Oral TID  . benzonatate  200 mg Oral TID  . diltiazem  120 mg Oral Daily  . ethambutol  1,200 mg Oral Daily  . fluticasone  2 spray Each Nare Daily  . fluticasone furoate-vilanterol  1 puff Inhalation Daily  . furosemide  40 mg Oral BID  . guaiFENesin  1,200 mg Oral BID  . heparin  5,000 Units Subcutaneous Q8H  . insulin aspart  0-20 Units Subcutaneous TID WC  . insulin aspart  4 Units Subcutaneous TID WC  . insulin glargine  32 Units Subcutaneous Daily  .  ipratropium  0.5 mg Nebulization Q6H  . levalbuterol  1.25 mg Nebulization Q6H  . mouth rinse  15 mL Mouth Rinse BID  . metFORMIN  500 mg Oral Q breakfast  . methylPREDNISolone (SOLU-MEDROL) injection  40 mg Intravenous Q12H  . montelukast  10 mg Oral QHS  . pantoprazole  40 mg Oral Daily  . potassium chloride  40 mEq Oral Daily  . rifampin  600 mg Oral Daily   Continuous: . cefTRIAXone (ROCEPHIN)  IV 1 g (04/22/18 1158)   RVU:[id number] **OR** acetaminophen, alum & mag hydroxide-simeth, HYDROcodone-homatropine, HYDROmorphone, levalbuterol, LORazepam, nitroGLYCERIN, ondansetron **OR** ondansetron (ZOFRAN) IV, oxyCODONE, phenol  Assesment: He was admitted with COPD exacerbation.  He is improving.  He has some element of allergies/chronic sinusitis as well.  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  He has coronary disease which is stable  He has diabetes on insulin and his sugars been up because of steroids  He has abdominal pain which I think may be related to his coughing  He has chronic diastolic heart failure but that seems stable now Principal Problem:   COPD with acute exacerbation ([place]) Active Problems:   Hyperlipidemia   GERD with stricture   ASCVD (arteriosclerotic cardiovascular disease)   Type 2 diabetes mellitus without complication (H[facility])   Paroxysmal atrial fibrillation ([place])   MAI (mycobacterium avium-intracellulare) ([place])   Respiratory failure, acute-on-chronic ([place])    Plan: Continue treatments.  Add Flonase.  He is already using saline nasal spray.  Potential discharge next 48 hours    LOS: 3 days   Enya Bureau L 04/23/2018, 8:42 AM

## 2018-04-24 LAB — GLUCOSE, CAPILLARY
GLUCOSE-CAPILLARY: 257 mg/dL — AB (ref 70–99)
GLUCOSE-CAPILLARY: 191 mg/dL — AB (ref 70–99)
Glucose-Capillary: 255 mg/dL — ABNORMAL HIGH (ref 70–99)
Glucose-Capillary: 305 mg/dL — ABNORMAL HIGH (ref 70–99)

## 2018-04-24 LAB — CBC WITH DIFFERENTIAL/PLATELET
Abs Immature Granulocytes: 0.03 10*3/uL (ref 0.00–0.07)
BASOS ABS: 0 10*3/uL (ref 0.0–0.1)
Basophils Relative: 1 %
EOS ABS: 0.3 10*3/uL (ref 0.0–0.5)
EOS PCT: 4 %
HEMATOCRIT: 37.2 % — AB (ref 39.0–52.0)
Hemoglobin: 11.7 g/dL — ABNORMAL LOW (ref 13.0–17.0)
Immature Granulocytes: 1 %
LYMPHS ABS: 1.8 10*3/uL (ref 0.7–4.0)
LYMPHS PCT: 29 %
MCH: 27.5 pg (ref 26.0–34.0)
MCHC: 31.5 g/dL (ref 30.0–36.0)
MCV: 87.3 fL (ref 80.0–100.0)
MONOS PCT: 11 %
Monocytes Absolute: 0.7 10*3/uL (ref 0.1–1.0)
NRBC: 0 % (ref 0.0–0.2)
Neutro Abs: 3.3 10*3/uL (ref 1.7–7.7)
Neutrophils Relative %: 54 %
Platelets: 194 10*3/uL (ref 150–400)
RBC: 4.26 MIL/uL (ref 4.22–5.81)
RDW: 14.2 % (ref 11.5–15.5)
WBC: 6.2 10*3/uL (ref 4.0–10.5)

## 2018-04-24 MED ORDER — INSULIN GLARGINE 100 UNIT/ML ~~LOC~~ SOLN
36.0000 [IU] | Freq: Every day | SUBCUTANEOUS | Status: DC
  Administered 2018-04-24 – 2018-04-25 (×2): 36 [IU] via SUBCUTANEOUS
  Filled 2018-04-24 (×3): qty 0.36

## 2018-04-24 MED ORDER — PREDNISONE 20 MG PO TABS
40.0000 mg | ORAL_TABLET | Freq: Every day | ORAL | Status: DC
  Administered 2018-04-24 – 2018-04-25 (×2): 40 mg via ORAL
  Filled 2018-04-24 (×2): qty 2

## 2018-04-24 NOTE — Progress Notes (Signed)
Subjective: He is generally improving.  He still coughing productively.  He is starting to get a little swelling of his legs.  He is still hyperglycemic but I am going to switch him to oral steroids today.  Still has abdominal pain.  Objective: Vital signs in last 24 hours: Temp:  [98.2 F (36.8 C)-98.5 F (36.9 C)] 98.5 F (36.9 C) (10/12 0458) Pulse Rate:  [76-99] 84 (10/12 0458) Resp:  [18-20] 18 (10/12 0458) BP: (121-134)/(76-94) 121/87 (10/12 0458) SpO2:  [93 %-100 %] 97 % (10/12 0808) Weight change:  Last BM Date: 04/23/18  Intake/Output from previous day: 10/11 0701 - 10/12 0700 In: 1200 [P.O.:1200] Out: 2100 [Urine:2100]  PHYSICAL EXAM General appearance: alert, cooperative and mild distress Resp: rhonchi bilaterally Cardio: regular rate and rhythm, S1, S2 normal, no murmur, click, rub or gallop GI: Still tender in the left lower quadrant no rebound Extremities: Trace edema bilaterally  Lab Results:  Results for orders placed or performed during the hospital encounter of 04/19/18 (from the past 48 hour(s))  Glucose, capillary     Status: Abnormal   Collection Time: 04/22/18 11:15 AM  Result Value Ref Range   Glucose-Capillary 255 (H) 70 - 99 mg/dL   Comment 1 Notify RN    Comment 2 Document in Chart   Glucose, capillary     Status: Abnormal   Collection Time: 04/22/18  4:45 PM  Result Value Ref Range   Glucose-Capillary 286 (H) 70 - 99 mg/dL   Comment 1 Notify RN    Comment 2 Document in Chart   Glucose, capillary     Status: Abnormal   Collection Time: 04/22/18  9:27 PM  Result Value Ref Range   Glucose-Capillary 109 (H) 70 - 99 mg/dL  CBC WITH DIFFERENTIAL     Status: Abnormal   Collection Time: 04/23/18  5:03 AM  Result Value Ref Range   WBC 7.1 4.0 - 10.5 K/uL   RBC 4.46 4.22 - 5.81 MIL/uL   Hemoglobin 12.4 (L) 13.0 - 17.0 g/dL   HCT 39.1 39.0 - 52.0 %   MCV 87.7 80.0 - 100.0 fL   MCH 27.8 26.0 - 34.0 pg   MCHC 31.7 30.0 - 36.0 g/dL   RDW 14.0 11.5  - 15.5 %   Platelets 208 150 - 400 K/uL   nRBC 0.0 0.0 - 0.2 %   Neutrophils Relative % 58 %   Neutro Abs 4.1 1.7 - 7.7 K/uL   Lymphocytes Relative 27 %   Lymphs Abs 1.9 0.7 - 4.0 K/uL   Monocytes Relative 10 %   Monocytes Absolute 0.7 0.1 - 1.0 K/uL   Eosinophils Relative 3 %   Eosinophils Absolute 0.2 0.0 - 0.5 K/uL   Basophils Relative 1 %   Basophils Absolute 0.1 0.0 - 0.1 K/uL   Immature Granulocytes 1 %   Abs Immature Granulocytes 0.05 0.00 - 0.07 K/uL    Comment: Performed at [facility], [address], [place], [place] [address]  Glucose, capillary     Status: Abnormal   Collection Time: 04/23/18  7:43 AM  Result Value Ref Range   Glucose-Capillary 242 (H) 70 - 99 mg/dL  Glucose, capillary     Status: Abnormal   Collection Time: 04/23/18 11:22 AM  Result Value Ref Range   Glucose-Capillary 310 (H) 70 - 99 mg/dL  Glucose, capillary     Status: Abnormal   Collection Time: 04/23/18  4:24 PM  Result Value Ref Range   Glucose-Capillary 193 (H)  70 - 99 mg/dL   Comment 1 Notify RN    Comment 2 Document in Chart   Glucose, capillary     Status: Abnormal   Collection Time: 04/23/18  8:52 PM  Result Value Ref Range   Glucose-Capillary 222 (H) 70 - 99 mg/dL   Comment 1 Notify RN    Comment 2 Document in Chart   CBC WITH DIFFERENTIAL     Status: Abnormal   Collection Time: 04/24/18  6:02 AM  Result Value Ref Range   WBC 6.2 4.0 - 10.5 K/uL   RBC 4.26 4.22 - 5.81 MIL/uL   Hemoglobin 11.7 (L) 13.0 - 17.0 g/dL   HCT 37.2 (L) 39.0 - 52.0 %   MCV 87.3 80.0 - 100.0 fL   MCH 27.5 26.0 - 34.0 pg   MCHC 31.5 30.0 - 36.0 g/dL   RDW 14.2 11.5 - 15.5 %   Platelets 194 150 - 400 K/uL   nRBC 0.0 0.0 - 0.2 %   Neutrophils Relative % 54 %   Neutro Abs 3.3 1.7 - 7.7 K/uL   Lymphocytes Relative 29 %   Lymphs Abs 1.8 0.7 - 4.0 K/uL   Monocytes Relative 11 %   Monocytes Absolute 0.7 0.1 - 1.0 K/uL   Eosinophils Relative 4 %   Eosinophils Absolute 0.3 0.0 - 0.5 K/uL   Basophils  Relative 1 %   Basophils Absolute 0.0 0.0 - 0.1 K/uL   Immature Granulocytes 1 %   Abs Immature Granulocytes 0.03 0.00 - 0.07 K/uL    Comment: Performed at [facility], [address] [place], [place] [address]  Glucose, capillary     Status: Abnormal   Collection Time: 04/24/18  7:49 AM  Result Value Ref Range   Glucose-Capillary 255 (H) 70 - 99 mg/dL    ABGS No results for input(s): PHART, PO2ART, TCO2, HCO3 in the last 72 hours.  Invalid input(s): PCO2 CULTURES Recent Results (from the past 240 hour(s))  MRSA PCR Screening     Status: None   Collection Time: 04/20/18  1:07 AM  Result Value Ref Range Status   MRSA by PCR NEGATIVE NEGATIVE Final    Comment:        The GeneXpert MRSA Assay (FDA approved for NASAL specimens only), is one component of a comprehensive MRSA colonization surveillance program. It is not intended to diagnose MRSA infection nor to guide or monitor treatment for MRSA infections. Performed at [facility], [address], [place], [place] [address]    Studies/Results: Ct Abdomen Pelvis W Contrast  Result Date: 04/22/2018 CLINICAL DATA:  57 year old male with acute abdominal pain today. EXAM: CT ABDOMEN AND PELVIS WITH CONTRAST TECHNIQUE: Multidetector CT imaging of the abdomen and pelvis was performed using the standard protocol following bolus administration of intravenous contrast. CONTRAST:  153mL ISOVUE-300 IOPAMIDOL (ISOVUE-300) INJECTION 61% COMPARISON:  12/09/2017 and prior CTs FINDINGS: Lower chest: Emphysema and mild LEFT basilar scarring again noted. Hepatobiliary: Mild hepatic steatosis noted. No focal hepatic abnormalities are present. The gallbladder is unremarkable. No biliary dilatation. Pancreas: Unremarkable Spleen: Unremarkable Adrenals/Urinary Tract: The kidneys, adrenal glands and bladder are unremarkable. Stomach/Bowel: Stomach is within normal limits except for small hiatal hernia. Appendix appears normal. No evidence of bowel wall  thickening, distention, or inflammatory changes. Vascular/Lymphatic: Aortic atherosclerosis. No enlarged abdominal or pelvic lymph nodes. Reproductive: Prostate is unremarkable. Other: No free fluid, focal collection or pneumoperitoneum. LEFT inguinal hernia surgical changes noted. Musculoskeletal: No acute or suspicious bony abnormalities. IMPRESSION: 1. No  evidence of acute abnormality or CT findings to suggest a cause for this patient's abdominal pain. 2. Mild hepatic steatosis number 3. Small hiatal hernia 4. Aortic Atherosclerosis (ICD10-I70.0) and Emphysema (ICD10-J43.9). Electronically Signed   By: Margarette Canada M.D.   On: 04/22/2018 14:09    Medications:  Prior to Admission:  Medications Prior to Admission  Medication Sig Dispense Refill Last Dose  . albuterol (VENTOLIN HFA) 108 (90 BASE) MCG/ACT inhaler Inhale 2 puffs into the lungs every 6 (six) hours as needed for wheezing or shortness of breath.   04/19/2018 at Unknown time  . azithromycin (ZITHROMAX) 500 MG tablet Take 1 tablet (500 mg total) by mouth daily. 30 tablet 11 04/19/2018 at Unknown time  . baclofen (LIORESAL) 20 MG tablet Take 20 mg by mouth 3 (three) times daily.    04/19/2018 at Unknown time  . benzonatate (TESSALON) 200 MG capsule Take 200 mg by mouth 3 (three) times daily.   04/19/2018 at Unknown time  . budesonide (PULMICORT) 0.25 MG/2ML nebulizer solution Take 2 mLs (0.25 mg total) by nebulization 2 (two) times daily. 60 mL 12 04/19/2018 at Unknown time  . CARTIA XT 120 MG 24 hr capsule Take 1 capsule (120 mg total) by mouth daily. 7 capsule 0 04/19/2018 at Unknown time  . ethambutol (MYAMBUTOL) 400 MG tablet Take 3 tablets (1,200 mg total) by mouth daily. 90 tablet 11 04/19/2018 at Unknown time  . fexofenadine (ALLEGRA) 180 MG tablet Take 180 mg by mouth daily.   04/19/2018 at Unknown time  . Fluticasone-Umeclidin-Vilant (TRELEGY ELLIPTA) 100-62.5-25 MCG/INH AEPB Inhale 1 puff into the lungs daily. Rinse mouth after you use this 1  each 12 04/19/2018 at Unknown time  . furosemide (LASIX) 40 MG tablet Take 1 tablet (40 mg total) by mouth 2 (two) times daily. 60 tablet 12 04/19/2018 at Unknown time  . guaifenesin (HUMIBID E) 400 MG TABS tablet Take 400 mg by mouth every 4 (four) hours.   04/19/2018 at Unknown time  . HUMALOG KWIKPEN 100 UNIT/ML KiwkPen Sliding scale  0 04/19/2018 at Unknown time  . insulin glargine (LANTUS) 100 UNIT/ML injection Inject 0.24 mLs (24 Units total) into the skin daily. 24 units daily (Patient taking differently: Inject 26 Units into the skin daily. 24 units daily) 10 mL 11 04/19/2018 at Unknown time  . ipratropium (ATROVENT) 0.02 % nebulizer solution VVN TID WITH LEVALBUTEROL  5 04/19/2018 at Unknown time  . LORazepam (ATIVAN) 0.5 MG tablet Take 1 tablet (0.5 mg total) by mouth 2 (two) times daily as needed for anxiety or sleep. (Patient taking differently: Take 0.5 mg by mouth 4 (four) times daily as needed for anxiety or sleep. ) 20 tablet 0 04/19/2018 at Unknown time  . metFORMIN (GLUCOPHAGE) 500 MG tablet Take 1 tablet (500 mg total) by mouth daily with breakfast. 30 tablet 3 04/19/2018 at Unknown time  . montelukast (SINGULAIR) 10 MG tablet Take 10 mg by mouth at bedtime.   Past Week at Unknown time  . nitroGLYCERIN (NITROSTAT) 0.4 MG SL tablet Place 1 tablet (0.4 mg total) under the tongue every 5 (five) minutes as needed for chest pain. 25 tablet 4 unknown  . omeprazole (PRILOSEC) 20 MG capsule Take 20 mg by mouth daily.   04/19/2018 at Unknown time  . oxyCODONE-acetaminophen (PERCOCET) 10-325 MG tablet Take 1 tablet by mouth every 4 (four) hours as needed for pain.   04/19/2018 at Unknown time  . potassium chloride SA (K-DUR,KLOR-CON) 20 MEQ tablet Take 20 mEq by  mouth daily as needed (swelling).   04/19/2018 at Unknown time  . predniSONE (DELTASONE) 10 MG tablet Take 2 daily for 1 week then 1 daily (Patient taking differently: Take 10 mg by mouth daily with breakfast. Take 2 daily for 1 week then 1 daily)  60 tablet 5 04/19/2018 at Unknown time  . rifampin (RIFADIN) 300 MG capsule Take 2 capsules (600 mg total) by mouth daily. 60 capsule 11 04/19/2018 at Unknown time  . rosuvastatin (CRESTOR) 10 MG tablet TK 1 T PO QD  11 04/19/2018 at Unknown time  . fluticasone (FLONASE) 50 MCG/ACT nasal spray SHAKE LQ AND U 2 SPRAYS IEN QD  5   . insulin lispro (HUMALOG) 100 UNIT/ML injection Take by sliding scale provided by hospital maximum amount daily is 60 units.  Check blood sugar 4 times a day 10 mL 11 Taking   Scheduled: . azithromycin  500 mg Oral Daily  . baclofen  20 mg Oral TID  . benzonatate  200 mg Oral TID  . diltiazem  120 mg Oral Daily  . ethambutol  1,200 mg Oral Daily  . fluticasone  2 spray Each Nare Daily  . fluticasone furoate-vilanterol  1 puff Inhalation Daily  . furosemide  40 mg Oral BID  . guaiFENesin  1,200 mg Oral BID  . heparin  5,000 Units Subcutaneous Q8H  . insulin aspart  0-20 Units Subcutaneous TID WC  . insulin aspart  4 Units Subcutaneous TID WC  . insulin glargine  32 Units Subcutaneous Daily  . ipratropium  0.5 mg Nebulization Q6H  . levalbuterol  1.25 mg Nebulization Q6H  . mouth rinse  15 mL Mouth Rinse BID  . metFORMIN  500 mg Oral Q breakfast  . montelukast  10 mg Oral QHS  . pantoprazole  40 mg Oral Daily  . potassium chloride  40 mEq Oral Daily  . predniSONE  40 mg Oral Q breakfast  . rifampin  600 mg Oral Daily   Continuous: . cefTRIAXone (ROCEPHIN)  IV 1 g (04/23/18 1226)   OHY:[id number] **OR** acetaminophen, alum & mag hydroxide-simeth, HYDROcodone-homatropine, HYDROmorphone, levalbuterol, LORazepam, nitroGLYCERIN, ondansetron **OR** ondansetron (ZOFRAN) IV, oxyCODONE, phenol  Assesment: He was admitted with COPD exacerbation.  He is clearing but slowly.  He has severe COPD at baseline.  He has mild acute exacerbation of diastolic heart failure and I think that may get better when we get him off of the IV steroids.  He is already on Lasix  He  has diabetes which is not controlled but I am going to switch him from IV steroids to p.o. 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) Active Problems:   Hyperlipidemia   GERD with stricture   ASCVD (arteriosclerotic cardiovascular disease)   Type 2 diabetes mellitus without complication (HCC)   Paroxysmal atrial fibrillation (HCC)   MAI (mycobacterium avium-intracellulare) ([place])   Respiratory failure, acute-on-chronic ([place])    Plan: Continue current treatments.  Discontinue Solu-Medrol.  Start prednisone.  Increase Lantus to 36 units and watch for hypoglycemia    LOS: 4 days   Mieczyslaw Stamas L 04/24/2018, 9:14 AM

## 2018-04-25 DIAGNOSIS — Z23 Encounter for immunization: Secondary | ICD-10-CM | POA: Diagnosis not present

## 2018-04-25 LAB — CBC WITH DIFFERENTIAL/PLATELET
Abs Immature Granulocytes: 0.05 10*3/uL (ref 0.00–0.07)
BASOS ABS: 0.1 10*3/uL (ref 0.0–0.1)
BASOS PCT: 1 %
Eosinophils Absolute: 0.3 10*3/uL (ref 0.0–0.5)
Eosinophils Relative: 6 %
HCT: 34.6 % — ABNORMAL LOW (ref 39.0–52.0)
Hemoglobin: 11 g/dL — ABNORMAL LOW (ref 13.0–17.0)
Immature Granulocytes: 1 %
LYMPHS ABS: 2.2 10*3/uL (ref 0.7–4.0)
LYMPHS PCT: 37 %
MCH: 28.1 pg (ref 26.0–34.0)
MCHC: 31.8 g/dL (ref 30.0–36.0)
MCV: 88.5 fL (ref 80.0–100.0)
Monocytes Absolute: 0.7 10*3/uL (ref 0.1–1.0)
Monocytes Relative: 12 %
Neutro Abs: 2.5 10*3/uL (ref 1.7–7.7)
Neutrophils Relative %: 43 %
PLATELETS: 176 10*3/uL (ref 150–400)
RBC: 3.91 MIL/uL — ABNORMAL LOW (ref 4.22–5.81)
RDW: 13.9 % (ref 11.5–15.5)
WBC: 5.8 10*3/uL (ref 4.0–10.5)
nRBC: 0 % (ref 0.0–0.2)

## 2018-04-25 LAB — GLUCOSE, CAPILLARY
GLUCOSE-CAPILLARY: 212 mg/dL — AB (ref 70–99)
GLUCOSE-CAPILLARY: 278 mg/dL — AB (ref 70–99)

## 2018-04-25 LAB — BASIC METABOLIC PANEL
Anion gap: 10 (ref 5–15)
BUN: 21 mg/dL — AB (ref 6–20)
CO2: 30 mmol/L (ref 22–32)
Calcium: 8.8 mg/dL — ABNORMAL LOW (ref 8.9–10.3)
Chloride: 97 mmol/L — ABNORMAL LOW (ref 98–111)
Creatinine, Ser: 0.96 mg/dL (ref 0.61–1.24)
Glucose, Bld: 233 mg/dL — ABNORMAL HIGH (ref 70–99)
Potassium: 3.4 mmol/L — ABNORMAL LOW (ref 3.5–5.1)
SODIUM: 137 mmol/L (ref 135–145)

## 2018-04-25 MED ORDER — HYDROCODONE-HOMATROPINE 5-1.5 MG/5ML PO SYRP: 5.0000 mL | ORAL_SOLUTION | ORAL | 0 refills | Status: DC | PRN

## 2018-04-25 MED ORDER — LEVOFLOXACIN 500 MG PO TABS: 500.0000 mg | ORAL_TABLET | Freq: Every day | ORAL | 0 refills | Status: AC

## 2018-04-25 NOTE — Progress Notes (Signed)
He feels much better and wants to go home.  Discussed with the patient and his wife at bedside.  His chest is clear now.  I think he is back to baseline and ready for discharge

## 2018-04-25 NOTE — Progress Notes (Signed)
Patient discharged and educated on discharge instructions. IV discontinued and paper prescription given to patient.  Elda Dunkerson Rica Mote, RN

## 2018-04-25 NOTE — Discharge Summary (Addendum)
Physician Discharge Summary  Patient ID: Ryan [name] MRN: [id number] DOB/AGE: 59-Jan-1962 59 y.o. Primary Care Physician:Jianna Drabik, Percell Miller, MD Admit date: 04/19/2018 Discharge date: 04/25/2018    Discharge Diagnoses:   Principal Problem:   COPD with acute exacerbation [facility]) Active Problems:   Hyperlipidemia   GERD with stricture   ASCVD (arteriosclerotic cardiovascular disease)   Type 2 diabetes mellitus without complication (HCC)   Paroxysmal atrial fibrillation (HCC)   MAI (mycobacterium avium-intracellulare) (HCC)   Respiratory failure, acute-on-chronic (HCC) Acute on chronic diastolic heart failure Abdominal pain  Allergies as of 04/25/2018      Reactions   Albuterol Palpitations   Ciprofloxacin Other (See Comments)   Patient experiences vision loss from long-term and short-term use   Influenza Vaccine Live Swelling      Medication List    TAKE these medications   azithromycin 500 MG tablet Commonly known as:  ZITHROMAX Take 1 tablet (500 mg total) by mouth daily.   baclofen 20 MG tablet Commonly known as:  LIORESAL Take 20 mg by mouth 3 (three) times daily.   benzonatate 200 MG capsule Commonly known as:  TESSALON Take 200 mg by mouth 3 (three) times daily.   budesonide 0.25 MG/2ML nebulizer solution Commonly known as:  PULMICORT Take 2 mLs (0.25 mg total) by nebulization 2 (two) times daily.   CARTIA XT 120 MG 24 hr capsule Generic drug:  diltiazem Take 1 capsule (120 mg total) by mouth daily.   ethambutol 400 MG tablet Commonly known as:  MYAMBUTOL Take 3 tablets (1,200 mg total) by mouth daily.   fexofenadine 180 MG tablet Commonly known as:  ALLEGRA Take 180 mg by mouth daily.   fluticasone 50 MCG/ACT nasal spray Commonly known as:  FLONASE SHAKE LQ AND U 2 SPRAYS IEN QD   Fluticasone-Umeclidin-Vilant 100-62.5-25 MCG/INH Aepb Inhale 1 puff into the lungs daily. Rinse mouth after you use this   furosemide 40 MG tablet Commonly known as:   LASIX Take 1 tablet (40 mg total) by mouth 2 (two) times daily.   guaifenesin 400 MG Tabs tablet Commonly known as:  HUMIBID E Take 400 mg by mouth every 4 (four) hours.   HYDROcodone-homatropine 5-1.5 MG/5ML syrup Commonly known as:  HYCODAN Take 5 mLs by mouth every 4 (four) hours as needed for cough.   insulin glargine 100 UNIT/ML injection Commonly known as:  LANTUS Inject 0.24 mLs (24 Units total) into the skin daily. 24 units daily What changed:  how much to take   insulin lispro 100 UNIT/ML injection Commonly known as:  HUMALOG Take by sliding scale provided by hospital maximum amount daily is 60 units.  Check blood sugar 4 times a day   HUMALOG KWIKPEN 100 UNIT/ML KiwkPen Generic drug:  insulin lispro Sliding scale   ipratropium 0.02 % nebulizer solution Commonly known as:  ATROVENT VVN TID WITH LEVALBUTEROL   levofloxacin 500 MG tablet Commonly known as:  LEVAQUIN Take 1 tablet (500 mg total) by mouth daily for 7 days.   LORazepam 0.5 MG tablet Commonly known as:  ATIVAN Take 1 tablet (0.5 mg total) by mouth 2 (two) times daily as needed for anxiety or sleep. What changed:  when to take this   metFORMIN 500 MG tablet Commonly known as:  GLUCOPHAGE Take 1 tablet (500 mg total) by mouth daily with breakfast.   montelukast 10 MG tablet Commonly known as:  SINGULAIR Take 10 mg by mouth at bedtime.   nitroGLYCERIN 0.4 MG SL tablet Commonly known as:  NITROSTAT Place 1 tablet (0.4 mg total) under the tongue every 5 (five) minutes as needed for chest pain.   omeprazole 20 MG capsule Commonly known as:  PRILOSEC Take 20 mg by mouth daily.   oxyCODONE-acetaminophen 10-325 MG tablet Commonly known as:  PERCOCET Take 1 tablet by mouth every 4 (four) hours as needed for pain.   potassium chloride SA 20 MEQ tablet Commonly known as:  K-DUR,KLOR-CON Take 20 mEq by mouth daily as needed (swelling).   predniSONE 10 MG tablet Commonly known as:  DELTASONE Take 2  daily for 1 week then 1 daily What changed:    how much to take  how to take this  when to take this   rifampin 300 MG capsule Commonly known as:  RIFADIN Take 2 capsules (600 mg total) by mouth daily.   rosuvastatin 10 MG tablet Commonly known as:  CRESTOR TK 1 T PO QD   VENTOLIN HFA 108 (90 Base) MCG/ACT inhaler Generic drug:  albuterol Inhale 2 puffs into the lungs every 6 (six) hours as needed for wheezing or shortness of breath.       Discharged Condition: Improved    Consults: None  Significant Diagnostic Studies: Dg Chest 2 View  Result Date: 04/19/2018 CLINICAL DATA:  Shortness of breath EXAM: CHEST - 2 VIEW COMPARISON:  Nov 27, 2017 FINDINGS: There are multiple bullae, most notably in the right upper lobe. There are multiple areas of scarring. There is no edema or consolidation. The heart size is normal. Pulmonary vascularity reflects the underlying bullous disease in the upper lobes. No adenopathy. There is evidence of an old fracture of the anterior left fifth rib. 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 No edema or consolidation. Stable cardiac silhouette. No evident adenopathy. Emphysema (ICD10-J43.9). Electronically Signed   By: Lowella Grip III M.D.   On: 04/19/2018 21:39   Ct Abdomen Pelvis W Contrast  Result Date: 04/22/2018 CLINICAL DATA:  57 year old male with acute abdominal pain today. EXAM: CT ABDOMEN AND PELVIS WITH CONTRAST TECHNIQUE: Multidetector CT imaging of the abdomen and pelvis was performed using the standard protocol following bolus administration of intravenous contrast. CONTRAST:  159mL ISOVUE-300 IOPAMIDOL (ISOVUE-300) INJECTION 61% COMPARISON:  12/09/2017 and prior CTs FINDINGS: Lower chest: Emphysema and mild LEFT basilar scarring again noted. Hepatobiliary: Mild hepatic steatosis noted. No focal hepatic abnormalities are present. The gallbladder is unremarkable. No biliary dilatation. Pancreas: Unremarkable  Spleen: Unremarkable Adrenals/Urinary Tract: The kidneys, adrenal glands and bladder are unremarkable. Stomach/Bowel: Stomach is within normal limits except for small hiatal hernia. Appendix appears normal. No evidence of bowel wall thickening, distention, or inflammatory changes. Vascular/Lymphatic: Aortic atherosclerosis. No enlarged abdominal or pelvic lymph nodes. Reproductive: Prostate is unremarkable. Other: No free fluid, focal collection or pneumoperitoneum. LEFT inguinal hernia surgical changes noted. Musculoskeletal: No acute or suspicious bony abnormalities. IMPRESSION: 1. No evidence of acute abnormality or CT findings to suggest a cause for this patient's abdominal pain. 2. Mild hepatic steatosis number 3. Small hiatal hernia 4. Aortic Atherosclerosis (ICD10-I70.0) and Emphysema (ICD10-J43.9). Electronically Signed   By: Margarette Canada M.D.   On: 04/22/2018 14:09    Lab Results: Basic Metabolic Panel: Recent Labs    04/25/18 0517  NA 137  K 3.4*  CL 97*  CO2 30  GLUCOSE 233*  BUN 21*  CREATININE 0.96  CALCIUM 8.8*   Liver Function Tests: No results for input(s): AST, ALT, ALKPHOS, BILITOT, PROT, ALBUMIN in the last 72 hours.   CBC: Recent Labs  04/24/18 0602 04/25/18 0517  WBC 6.2 5.8  NEUTROABS 3.3 2.5  HGB 11.7* 11.0*  HCT 37.2* 34.6*  MCV 87.3 88.5  PLT 194 176    Recent Results (from the past 240 hour(s))  MRSA PCR Screening     Status: None   Collection Time: 04/20/18  1:07 AM  Result Value Ref Range Status   MRSA by PCR NEGATIVE NEGATIVE Final    Comment:        The GeneXpert MRSA Assay (FDA approved for NASAL specimens only), is one component of a comprehensive MRSA colonization surveillance program. It is not intended to diagnose MRSA infection nor to guide or monitor treatment for MRSA infections. Performed at [facility], [address] [place], [place] [address]      Hospital Course: This is a 57 year old with multiple medical problems  including severe COPD, Mycobacterium avium intracellulare disease, heart failure, and who came to the emergency department with increased shortness of breath.  He was felt to be having COPD exacerbation.  He was treated and improved.  He had an episode of abdominal pain and underwent CT scanning which was negative for any acute problem in the abdomen.  He slowly improved as far as his breathing was concerned.  He had a mild exacerbation of heart failure which was treated.  He was on sliding scale and basal insulin and he knows how to do the sliding scale at home.  His potassium was 3.4 on the day of discharge.  He will likely be able to reduce his Lasix to once a day at home so he will take 2 extra doses of potassium and then will recheck basic metabolic profile in about 5 days  Discharge Exam: Blood pressure 135/86, pulse 77, temperature 97.7 F (36.5 C), temperature source Oral, resp. rate 19, height 5\' 8"  (1.727 m), weight 75.6 kg, SpO2 92 %. He is awake and alert.  His chest is clear.  Heart is regular  Disposition: Home      Signed: Ambreen Tufte L   04/25/2018, 10:46 AM

## 2018-04-26 ENCOUNTER — Other Ambulatory Visit: Payer: Self-pay

## 2018-04-26 ENCOUNTER — Telehealth: Payer: Self-pay

## 2018-04-26 ENCOUNTER — Ambulatory Visit: Payer: Medicare Other | Admitting: Gastroenterology

## 2018-04-26 ENCOUNTER — Encounter: Payer: Self-pay | Admitting: Gastroenterology

## 2018-04-26 VITALS — BP 114/72 | HR 112 | Temp 97.1°F | Ht 68.0 in | Wt 166.2 lb

## 2018-04-26 DIAGNOSIS — D638 Anemia in other chronic diseases classified elsewhere: Secondary | ICD-10-CM | POA: Diagnosis not present

## 2018-04-26 DIAGNOSIS — R1310 Dysphagia, unspecified: Secondary | ICD-10-CM

## 2018-04-26 MED ORDER — OMEPRAZOLE 20 MG PO CPDR: 20.0000 mg | DELAYED_RELEASE_CAPSULE | Freq: Two times a day (BID) | ORAL | 5 refills | Status: DC

## 2018-04-26 NOTE — Telephone Encounter (Signed)
Tried to call pt to inform of pre-op appt 04/30/18 at 12:45pm, call went straight to VM, LMOVM and informed of the appt. Tried to call his wife also, no answer, LMOVM with appt. Letter mailed.

## 2018-04-26 NOTE — Patient Instructions (Addendum)
We have arranged an upper endoscopy with dilation by Dr. Gala Romney in the near future.  Take only 1/2 dose of Lantus the evening prior to the endoscopy. No metformin the morning of the procedure.  I have increased Prilosec to twice a day, 30 minutes before breakfast and dinner.   We will see you in 3-4 months!  It was a pleasure to see you today. I strive to create trusting relationships with patients to provide genuine, compassionate, and quality care. I value your feedback. If you receive a survey regarding your visit,  I greatly appreciate you taking time to fill this out.   Ryan Needs, PhD, ANP-BC [facility]

## 2018-04-26 NOTE — Progress Notes (Signed)
Primary Care Physician:  [name], MD Primary Gastroenterologist:  Dr. Gala Romney   Chief Complaint  Patient presents with  . Dysphagia    HPI:   Ryan Bethel Hrdlicka is a 57 y.o. male presenting today at the request of Dr. Luan Pulling due to dysphagia. He was last seen by [place] in Feb 2017 during admission for pneumonia and respiratory failure in setting of severe COPD at baseline. His Hgb was in the 7 range at that time but no transfusion was required. He was on anticoagulation at that time. . EGD by Dr. Michail Sermon in Oct 2016 with minimal erosive esophagitis and small hiatal hernia. Last colonoscopy at [facility] 2012 normal. Remote history of Barrett's at time of EGD in 2011. No endoscopic evaluation completed in 2017 due to slow improvement from respiratory standpoint. He was lost to follow-up after that hospitalization.   Recurrent dysphagia started again in March/April. Solid food dysphagia. Liquids are ok. Can tolerate mashed potatoes. Difficulty with meats. Has pain in LLQ pain. CT scan done while in hospital unrevealing for etiology for abdominal pain.  BMs are regular. Had episode of 3 weeks' duration diarrhea but now normal ever since. No rectal bleeding. Worsening reflux. Feels like a lot of acid in esophagus. Has to vomit acid at times. Prilosec 20 mg daily.   6 liters O2 continuously. At baseline for respiratory status. History significant for severe COPD, followed by ID and on chronic antibiotics for chronic cavitary lung lesions (first noted many years ago) and pulmonary MAI infection.   Past Medical History:  Diagnosis Date  . Atrial fibrillation ([place])    Not anticoagulated  . Barrett's esophagus   . Borderline diabetes   . Bronchitis   . CAP (community acquired pneumonia) 07/10/2013   04/2015  . 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 Chronic back pain   . Chronic left shoulder pain   . Chronic neck pain   . Chronic respiratory failure ([place])     . Collagen vascular disease ([place])   . COPD (chronic obstructive pulmonary disease) ([place])   . Coronary atherosclerosis of native coronary artery    Mild nonobstructive disease at catheterization 2007  . DDD (degenerative disc disease)    Cervical and thoracic  . GERD (gastroesophageal reflux disease)   . History of pneumonia   . Hypercholesteremia   . Polycythemia   . Seizures ([place])    Last seizure 2 yrs ago  . Smoker   . Type 2 diabetes mellitus ([place])   . Type 2 diabetes mellitus without complication [facility])     Past Surgical History:  Procedure Laterality Date  . COLONOSCOPY  2012   Dr. Posey Pronto: normal  . ESOPHAGOGASTRODUODENOSCOPY N/A 05/04/2015   Dr. Michail Sermon: minimal erosive esophagitis, small hiatal hernia. FOOD PRECLUDED A COMPLETE EXAM. No biopsies taken  . ESOPHAGOGASTRODUODENOSCOPY  2011   [facility]: Barrett's   . LUNG BIOPSY    . Throat biopsy    . VASECTOMY    . VASECTOMY  1987    Current Outpatient Medications  Medication Sig Dispense Refill  . albuterol (VENTOLIN HFA) 108 (90 BASE) MCG/ACT inhaler Inhale 2 puffs into the lungs every 6 (six) hours as needed for wheezing or shortness of breath.    . baclofen (LIORESAL) 20 MG tablet Take 20 mg by mouth 3 (three) times daily.     . benzonatate (TESSALON) 200 MG capsule Take 200 mg by mouth 3 (three) times daily.    [name]  budesonide (PULMICORT) 0.25 MG/2ML nebulizer solution Take 2 mLs (0.25 mg total) by nebulization 2 (two) times daily. 60 mL 12  . CARTIA XT 120 MG 24 hr capsule Take 1 capsule (120 mg total) by mouth daily. 7 capsule 0  . ethambutol (MYAMBUTOL) 400 MG tablet Take 3 tablets (1,200 mg total) by mouth daily. 90 tablet 11  . fexofenadine (ALLEGRA) 180 MG tablet Take 180 mg by mouth daily.    . fluticasone (FLONASE) 50 MCG/ACT nasal spray SHAKE LQ AND U 2 SPRAYS IEN QD  5  . Fluticasone-Umeclidin-Vilant (TRELEGY ELLIPTA) 100-62.5-25 MCG/INH AEPB Inhale 1 puff into the lungs daily. Rinse mouth after you use this  1 each 12  . furosemide (LASIX) 40 MG tablet Take 1 tablet (40 mg total) by mouth 2 (two) times daily. 60 tablet 12  . HUMALOG KWIKPEN 100 UNIT/ML KiwkPen Sliding scale  0  . HYDROcodone-homatropine (HYCODAN) 5-1.5 MG/5ML syrup Take 5 mLs by mouth every 4 (four) hours as needed for cough. 120 mL 0  . insulin glargine (LANTUS) 100 UNIT/ML injection Inject 0.24 mLs (24 Units total) into the skin daily. 24 units daily (Patient taking differently: Inject 26 Units into the skin daily. 24 units daily) 10 mL 11  . insulin lispro (HUMALOG) 100 UNIT/ML injection Take by sliding scale provided by hospital maximum amount daily is 60 units.  Check blood sugar 4 times a day 10 mL 11  . ipratropium (ATROVENT) 0.02 % nebulizer solution VVN TID WITH LEVALBUTEROL  5  . levofloxacin (LEVAQUIN) 500 MG tablet Take 1 tablet (500 mg total) by mouth daily for 7 days. 7 tablet 0  . LORazepam (ATIVAN) 0.5 MG tablet Take 1 tablet (0.5 mg total) by mouth 2 (two) times daily as needed for anxiety or sleep. (Patient taking differently: Take 0.5 mg by mouth 2 (two) times daily. ) 20 tablet 0  . metFORMIN (GLUCOPHAGE) 500 MG tablet Take 1 tablet (500 mg total) by mouth daily with breakfast. 30 tablet 3  . montelukast (SINGULAIR) 10 MG tablet Take 10 mg by mouth at bedtime.    . nitroGLYCERIN (NITROSTAT) 0.4 MG SL tablet Place 1 tablet (0.4 mg total) under the tongue every 5 (five) minutes as needed for chest pain. 25 tablet 4  . omeprazole (PRILOSEC) 20 MG capsule Take 20 mg by mouth daily.    [name] oxyCODONE-acetaminophen (PERCOCET) 10-325 MG tablet Take 1 tablet by mouth every 4 (four) hours as needed for pain.    . potassium chloride SA (K-DUR,KLOR-CON) 20 MEQ tablet Take 20 mEq by mouth daily as needed (swelling).    . predniSONE (DELTASONE) 10 MG tablet Take 2 daily for 1 week then 1 daily (Patient taking differently: Take 10 mg by mouth daily with breakfast. Take 2 daily for 1 week then 1 daily) 60 tablet 5  . rifampin (RIFADIN)  300 MG capsule Take 2 capsules (600 mg total) by mouth daily. 60 capsule 11  . rosuvastatin (CRESTOR) 10 MG tablet TK 1 T PO QD  11  . azithromycin (ZITHROMAX) 500 MG tablet Take 1 tablet (500 mg total) by mouth daily. (Patient not taking: Reported on 04/26/2018) 30 tablet 11  . guaifenesin (HUMIBID E) 400 MG TABS tablet Take 400 mg by mouth every 4 (four) hours.     No current facility-administered medications for this visit.     Allergies as of 04/26/2018 - Review Complete 04/26/2018  Allergen Reaction Noted  . Albuterol Palpitations 05/01/2015  . Ciprofloxacin Other (See Comments) 09/15/2017  .  Influenza vaccine live Swelling 04/24/2011    Family History  Problem Relation Age of Onset  . Hypertension Mother   . Diabetes Mother   . Heart attack Mother   . Heart failure Mother   . Hypertension Sister   . Diabetes Sister   . Heart failure Sister   . Colon cancer Neg Hx     Social History   Socioeconomic History  . Marital status: Married    Spouse name: [name]  . Number of children: 4  . Years of education: 10  . Highest education level: Not on file  Occupational History  . Not on file  Social Needs  . Financial resource strain: Not on file  . Food insecurity:    Worry: Not on file    Inability: Not on file  . Transportation needs:    Medical: Not on file    Non-medical: Not on file  Tobacco Use  . Smoking status: Former Smoker    Packs/day: 0.50    Years: 45.00    Pack years: 22.50    Types: Cigarettes    Last attempt to quit: 05/01/2015    Years since quitting: 2.9  . Smokeless tobacco: Never Used  Substance and Sexual Activity  . Alcohol use: No    Alcohol/week: 0.0 standard drinks  . Drug use: No  . Sexual activity: Yes    Birth control/protection: Surgical  Lifestyle  . Physical activity:    Days per week: Not on file    Minutes per session: Not on file  . Stress: Not on file  Relationships  . Social connections:    Talks on phone: Not on file      Gets together: Not on file    Attends religious service: Not on file    Active member of club or organization: Not on file    Attends meetings of clubs or organizations: Not on file    Relationship status: Not on file  . Intimate partner violence:    Fear of current or ex partner: Not on file    Emotionally abused: Not on file    Physically abused: Not on file    Forced sexual activity: Not on file  Other Topics Concern  . Not on file  Social History Narrative   Patient is married [name]) and lives at home with his wife and one child.   Patient has four children.   Patient is disabled.   Patient has a high school education.   Patient is left-handed.   Patient does not drink any caffeine.    Review of Systems: Gen: Denies any fever, chills, fatigue, weight loss, lack of appetite.  CV: Denies chest pain, heart palpitations, peripheral edema, syncope.  Resp: +cough  GI: see HPI  GU : Denies urinary burning, urinary frequency, urinary hesitancy MS: +muscle spasms  Derm: Denies rash, itching, dry skin Psych: +anxiety  Heme: Denies bruising, bleeding, and enlarged lymph nodes.  Physical Exam: BP 114/72   Pulse (!) 112   Temp (!) 97.1 F (36.2 C) (Oral)   Ht 5\' 8"  (1.727 m)   Wt 166 lb 3.2 oz (75.4 kg)   BMI 25.27 kg/m  General:   Alert and oriented. Pleasant and cooperative. Well-nourished and well-developed.  Head:  Normocephalic and atraumatic. Eyes:  Without icterus, sclera clear and conjunctiva pink.  Ears:  Normal auditory acuity. Nose:  No deformity, discharge,  or lesions. Mouth:  No deformity or lesions, oral mucosa pink.  Lungs:  Scattered rhonchi,  coarse  Heart:  S1, S2 present without murmurs appreciated.  Abdomen:  +BS, distended, umbilical hernia present  Rectal:  Deferred  Msk:  Symmetrical without gross deformities. Normal posture. Pulses:  Normal pulses noted. Extremities:  Without edema. Neurologic:  Alert and  oriented x4;  grossly normal  neurologically. Skin:  Intact without significant lesions or rashes. Psych:  Alert and cooperative. Normal mood and affect.  Lab Results  Component Value Date   WBC 5.8 04/25/2018   HGB 11.0 (L) 04/25/2018   HCT 34.6 (L) 04/25/2018   MCV 88.5 04/25/2018   PLT 176 04/25/2018   Lab Results  Component Value Date   IRON 26 (L) 08/23/2015   TIBC 361 08/23/2015   FERRITIN 94 08/23/2015   Lab Results  Component Value Date   ALT 14 12/30/2017   AST 8 (L) 12/30/2017   ALKPHOS 91 10/17/2017   BILITOT 0.2 12/30/2017

## 2018-04-26 NOTE — H&P (View-Only) (Signed)
Primary Care Physician:  [name], MD Primary Gastroenterologist:  Dr. Gala Romney   Chief Complaint  Patient presents with  . Dysphagia    HPI:   Ryan [name] is a 57 y.o. male presenting today at the request of Dr. Luan Pulling due to dysphagia. He was last seen by [place] in Feb 2017 during admission for pneumonia and respiratory failure in setting of severe COPD at baseline. His Hgb was in the 7 range at that time but no transfusion was required. He was on anticoagulation at that time. . EGD by Dr. Michail Sermon in Oct 2016 with minimal erosive esophagitis and small hiatal hernia. Last colonoscopy at [facility] 2012 normal. Remote history of Barrett's at time of EGD in 2011. No endoscopic evaluation completed in 2017 due to slow improvement from respiratory standpoint. He was lost to follow-up after that hospitalization.   Recurrent dysphagia started again in March/April. Solid food dysphagia. Liquids are ok. Can tolerate mashed potatoes. Difficulty with meats. Has pain in LLQ pain. CT scan done while in hospital unrevealing for etiology for abdominal pain.  BMs are regular. Had episode of 3 weeks' duration diarrhea but now normal ever since. No rectal bleeding. Worsening reflux. Feels like a lot of acid in esophagus. Has to vomit acid at times. Prilosec 20 mg daily.   6 liters O2 continuously. At baseline for respiratory status. History significant for severe COPD, followed by ID and on chronic antibiotics for chronic cavitary lung lesions (first noted many years ago) and pulmonary MAI infection.   Past Medical History:  Diagnosis Date  . Atrial fibrillation ([place])    Not anticoagulated  . Barrett's esophagus   . Borderline diabetes   . Bronchitis   . CAP (community acquired pneumonia) 07/10/2013   04/2015  . 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 Chronic back pain   . Chronic left shoulder pain   . Chronic neck pain   . Chronic respiratory failure ([place])     . Collagen vascular disease ([place])   . COPD (chronic obstructive pulmonary disease) ([place])   . Coronary atherosclerosis of native coronary artery    Mild nonobstructive disease at catheterization 2007  . DDD (degenerative disc disease)    Cervical and thoracic  . GERD (gastroesophageal reflux disease)   . History of pneumonia   . Hypercholesteremia   . Polycythemia   . Seizures ([place])    Last seizure 2 yrs ago  . Smoker   . Type 2 diabetes mellitus ([place])   . Type 2 diabetes mellitus without complication [facility])     Past Surgical History:  Procedure Laterality Date  . COLONOSCOPY  2012   Dr. Posey Pronto: normal  . ESOPHAGOGASTRODUODENOSCOPY N/A 05/04/2015   Dr. Michail Sermon: minimal erosive esophagitis, small hiatal hernia. FOOD PRECLUDED A COMPLETE EXAM. No biopsies taken  . ESOPHAGOGASTRODUODENOSCOPY  2011   [facility]: Barrett's   . LUNG BIOPSY    . Throat biopsy    . VASECTOMY    . VASECTOMY  1987    Current Outpatient Medications  Medication Sig Dispense Refill  . albuterol (VENTOLIN HFA) 108 (90 BASE) MCG/ACT inhaler Inhale 2 puffs into the lungs every 6 (six) hours as needed for wheezing or shortness of breath.    . baclofen (LIORESAL) 20 MG tablet Take 20 mg by mouth 3 (three) times daily.     . benzonatate (TESSALON) 200 MG capsule Take 200 mg by mouth 3 (three) times daily.    [name]  budesonide (PULMICORT) 0.25 MG/2ML nebulizer solution Take 2 mLs (0.25 mg total) by nebulization 2 (two) times daily. 60 mL 12  . CARTIA XT 120 MG 24 hr capsule Take 1 capsule (120 mg total) by mouth daily. 7 capsule 0  . ethambutol (MYAMBUTOL) 400 MG tablet Take 3 tablets (1,200 mg total) by mouth daily. 90 tablet 11  . fexofenadine (ALLEGRA) 180 MG tablet Take 180 mg by mouth daily.    . fluticasone (FLONASE) 50 MCG/ACT nasal spray SHAKE LQ AND U 2 SPRAYS IEN QD  5  . Fluticasone-Umeclidin-Vilant (TRELEGY ELLIPTA) 100-62.5-25 MCG/INH AEPB Inhale 1 puff into the lungs daily. Rinse mouth after you use this  1 each 12  . furosemide (LASIX) 40 MG tablet Take 1 tablet (40 mg total) by mouth 2 (two) times daily. 60 tablet 12  . HUMALOG KWIKPEN 100 UNIT/ML KiwkPen Sliding scale  0  . HYDROcodone-homatropine (HYCODAN) 5-1.5 MG/5ML syrup Take 5 mLs by mouth every 4 (four) hours as needed for cough. 120 mL 0  . insulin glargine (LANTUS) 100 UNIT/ML injection Inject 0.24 mLs (24 Units total) into the skin daily. 24 units daily (Patient taking differently: Inject 26 Units into the skin daily. 24 units daily) 10 mL 11  . insulin lispro (HUMALOG) 100 UNIT/ML injection Take by sliding scale provided by hospital maximum amount daily is 60 units.  Check blood sugar 4 times a day 10 mL 11  . ipratropium (ATROVENT) 0.02 % nebulizer solution VVN TID WITH LEVALBUTEROL  5  . levofloxacin (LEVAQUIN) 500 MG tablet Take 1 tablet (500 mg total) by mouth daily for 7 days. 7 tablet 0  . LORazepam (ATIVAN) 0.5 MG tablet Take 1 tablet (0.5 mg total) by mouth 2 (two) times daily as needed for anxiety or sleep. (Patient taking differently: Take 0.5 mg by mouth 2 (two) times daily. ) 20 tablet 0  . metFORMIN (GLUCOPHAGE) 500 MG tablet Take 1 tablet (500 mg total) by mouth daily with breakfast. 30 tablet 3  . montelukast (SINGULAIR) 10 MG tablet Take 10 mg by mouth at bedtime.    . nitroGLYCERIN (NITROSTAT) 0.4 MG SL tablet Place 1 tablet (0.4 mg total) under the tongue every 5 (five) minutes as needed for chest pain. 25 tablet 4  . omeprazole (PRILOSEC) 20 MG capsule Take 20 mg by mouth daily.    [name] oxyCODONE-acetaminophen (PERCOCET) 10-325 MG tablet Take 1 tablet by mouth every 4 (four) hours as needed for pain.    . potassium chloride SA (K-DUR,KLOR-CON) 20 MEQ tablet Take 20 mEq by mouth daily as needed (swelling).    . predniSONE (DELTASONE) 10 MG tablet Take 2 daily for 1 week then 1 daily (Patient taking differently: Take 10 mg by mouth daily with breakfast. Take 2 daily for 1 week then 1 daily) 60 tablet 5  . rifampin (RIFADIN)  300 MG capsule Take 2 capsules (600 mg total) by mouth daily. 60 capsule 11  . rosuvastatin (CRESTOR) 10 MG tablet TK 1 T PO QD  11  . azithromycin (ZITHROMAX) 500 MG tablet Take 1 tablet (500 mg total) by mouth daily. (Patient not taking: Reported on 04/26/2018) 30 tablet 11  . guaifenesin (HUMIBID E) 400 MG TABS tablet Take 400 mg by mouth every 4 (four) hours.     No current facility-administered medications for this visit.     Allergies as of 04/26/2018 - Review Complete 04/26/2018  Allergen Reaction Noted  . Albuterol Palpitations 05/01/2015  . Ciprofloxacin Other (See Comments) 09/15/2017  .  Influenza vaccine live Swelling 04/24/2011    Family History  Problem Relation Age of Onset  . Hypertension Mother   . Diabetes Mother   . Heart attack Mother   . Heart failure Mother   . Hypertension Sister   . Diabetes Sister   . Heart failure Sister   . Colon cancer Neg Hx     Social History   Socioeconomic History  . Marital status: Married    Spouse name: [name]  . Number of children: 4  . Years of education: 10  . Highest education level: Not on file  Occupational History  . Not on file  Social Needs  . Financial resource strain: Not on file  . Food insecurity:    Worry: Not on file    Inability: Not on file  . Transportation needs:    Medical: Not on file    Non-medical: Not on file  Tobacco Use  . Smoking status: Former Smoker    Packs/day: 0.50    Years: 45.00    Pack years: 22.50    Types: Cigarettes    Last attempt to quit: 05/01/2015    Years since quitting: 2.9  . Smokeless tobacco: Never Used  Substance and Sexual Activity  . Alcohol use: No    Alcohol/week: 0.0 standard drinks  . Drug use: No  . Sexual activity: Yes    Birth control/protection: Surgical  Lifestyle  . Physical activity:    Days per week: Not on file    Minutes per session: Not on file  . Stress: Not on file  Relationships  . Social connections:    Talks on phone: Not on file      Gets together: Not on file    Attends religious service: Not on file    Active member of club or organization: Not on file    Attends meetings of clubs or organizations: Not on file    Relationship status: Not on file  . Intimate partner violence:    Fear of current or ex partner: Not on file    Emotionally abused: Not on file    Physically abused: Not on file    Forced sexual activity: Not on file  Other Topics Concern  . Not on file  Social History Narrative   Patient is married [name]) and lives at home with his wife and one child.   Patient has four children.   Patient is disabled.   Patient has a high school education.   Patient is left-handed.   Patient does not drink any caffeine.    Review of Systems: Gen: Denies any fever, chills, fatigue, weight loss, lack of appetite.  CV: Denies chest pain, heart palpitations, peripheral edema, syncope.  Resp: +cough  GI: see HPI  GU : Denies urinary burning, urinary frequency, urinary hesitancy MS: +muscle spasms  Derm: Denies rash, itching, dry skin Psych: +anxiety  Heme: Denies bruising, bleeding, and enlarged lymph nodes.  Physical Exam: BP 114/72   Pulse (!) 112   Temp (!) 97.1 F (36.2 C) (Oral)   Ht 5\' 8"  (1.727 m)   Wt 166 lb 3.2 oz (75.4 kg)   BMI 25.27 kg/m  General:   Alert and oriented. Pleasant and cooperative. Well-nourished and well-developed.  Head:  Normocephalic and atraumatic. Eyes:  Without icterus, sclera clear and conjunctiva pink.  Ears:  Normal auditory acuity. Nose:  No deformity, discharge,  or lesions. Mouth:  No deformity or lesions, oral mucosa pink.  Lungs:  Scattered rhonchi,  coarse  Heart:  S1, S2 present without murmurs appreciated.  Abdomen:  +BS, distended, umbilical hernia present  Rectal:  Deferred  Msk:  Symmetrical without gross deformities. Normal posture. Pulses:  Normal pulses noted. Extremities:  Without edema. Neurologic:  Alert and  oriented x4;  grossly normal  neurologically. Skin:  Intact without significant lesions or rashes. Psych:  Alert and cooperative. Normal mood and affect.  Lab Results  Component Value Date   WBC 5.8 04/25/2018   HGB 11.0 (L) 04/25/2018   HCT 34.6 (L) 04/25/2018   MCV 88.5 04/25/2018   PLT 176 04/25/2018   Lab Results  Component Value Date   IRON 26 (L) 08/23/2015   TIBC 361 08/23/2015   FERRITIN 94 08/23/2015   Lab Results  Component Value Date   ALT 14 12/30/2017   AST 8 (L) 12/30/2017   ALKPHOS 91 10/17/2017   BILITOT 0.2 12/30/2017

## 2018-04-27 ENCOUNTER — Encounter: Payer: Self-pay | Admitting: Internal Medicine

## 2018-04-28 ENCOUNTER — Other Ambulatory Visit: Payer: Self-pay | Admitting: *Deleted

## 2018-04-28 NOTE — Patient Outreach (Signed)
[place] [facility]) Care Management  04/28/2018  Ryan [name] June 12, 1961 [id number]   EMMI-general discharge  RED ON EMMI ALERT Day # 1 Date: 04/27/18 1618 Red Alert Reason: read discharge instructions? NO know who to call about changes in condition? NO   Outreach attempt # 1 A male answered and stated he was not home. [facility] RN CM left HIPAA compliant voicemail message along with CM's contact info.   Plan: [facility] RN CM sent an unsuccessful outreach letter and scheduled this patient for another call attempt within 4 business days  Shyheem Whitham L. Lavina Hamman, RN, BSN, [place] Coordinator Office number [phone] Mobile number [phone]  Main [facility] number [phone] Fax number [phone]

## 2018-04-29 ENCOUNTER — Other Ambulatory Visit: Payer: Self-pay | Admitting: *Deleted

## 2018-04-29 NOTE — Patient Outreach (Signed)
[facility]) Care Management  04/29/2018  Broughton [place] Terry 1961-07-04 [id number]   EMMI-general discharge  RED ON EMMI ALERT Day # 1 Date: 04/27/18 1618 Red Alert Reason: read discharge instructions? NO know who to call about changes in condition? NO   Outreach attempt # 2 A male answered and stated he was not home. [facility] RN CM left HIPAA compliant voicemail message along with CM's contact info.   Plan: [facility] RN CM  scheduled this patient for another call attempt within 4 business days  Kimberly L. Lavina Hamman, RN, BSN, [place] Coordinator Office number [phone] Mobile number [phone]  Main [facility] number [phone] Fax number [phone]

## 2018-04-30 ENCOUNTER — Encounter (HOSPITAL_COMMUNITY): Payer: Self-pay

## 2018-04-30 ENCOUNTER — Encounter (HOSPITAL_COMMUNITY)
Admission: RE | Admit: 2018-04-30 | Discharge: 2018-04-30 | Disposition: A | Discharge status: Home / Self Care | Payer: Medicare Other | Source: Ambulatory Visit | Attending: Internal Medicine | Admitting: Internal Medicine

## 2018-04-30 ENCOUNTER — Other Ambulatory Visit: Payer: Self-pay | Admitting: *Deleted

## 2018-04-30 ENCOUNTER — Other Ambulatory Visit (HOSPITAL_COMMUNITY): Payer: Medicare Other

## 2018-04-30 NOTE — Patient Outreach (Addendum)
Ryan [name]) Care Management  04/30/2018  Ryan [name] 05/03/1961 [id number]   EMMI-general discharge  RED ON EMMI ALERT Day #1 Date:04/27/18 [place] Reason:read discharge instructions? NO  know who to call about changes in condition? NO   Outreach attempt # 3 successful  Patient is able to verify HIPAA [place] Management RN reviewed and addressed red alert with patient Ryan [name] informed CM the answer to the EMMI questions are not correct He reports his wife read his discharge instructions to him and he knows he is to call Ryan [name], primary MD and pulmonologist for any questions or changes in his condition Today he informs [facility] RN CM that he was informed by the providers at the hospital that "there basically is nothing else they can do for me." [facility] RN CM offered assistance of [place] RN CM and SW but he refused the offer x 2 He also refused the offer for Telephonic RN CM follow up call to him   Social: Ryan [name] lives with his wife and son. He continues on 6 L O2 at all times He denies issues with getting to his medical appointments He receives assistance at home from he wife with all ADLs  DME oxygen, nebulizer   Conditions: severe COPD, Mycobacterium avium intracellulare disease, dysphagia, heart failure, hyperlipidemia, GERD, ASCVD, DM type 2,   Medications: denies concerns with taking medications as prescribed, affording medications, side effects of medications and questions about medications  Appointments: He did follow up with his GI MD on 04/26/18 His GI MD, Ryan [name], wants to schedule him for an upper endoscopy on 05/06/18 with dilation for his dysphasia   Advance Directives: Denies need for assist with advance directives   Consent: [facility] RN CM reviewed [facility]Ryan [name][facility] services with patient. Patient gave verbal consent for services. Advised patient that there will be further automated EMMI- post discharge calls to assess how the patient is doing  following the recent hospitalization Advised the patient that another call may be received from a nurse if any of their responses were abnormal. Patient voiced understanding and was appreciative of f/u call.   Plan: [facility] CM will close this case as Ryan [name] has chosen not to participate in [name] services  Pt encouraged to return a call to [place] CM prn  This note routed to primary MD  [place]. Lavina Hamman, RN, BSN, [place] Coordinator Office number [phone] Mobile number [phone]  [facility] number [phone] Fax number [phone]  \ [place]ly L. Lavina Hamman, RN, BSN, [place] Coordinator Office number [phone] Mobile number [phone]  Main [facility] number [phone] Fax number [phone]

## 2018-05-02 IMAGING — CT CT CHEST W/ CM
2 of 3 series · 15 of 36 positions shown, 18 images · IV contrast (iopamidol)
Comparison: CT of the chest November 28, 2015

CLINICAL DATA: Follow-up lung lesion. Intermittent antibiotics for
lung infection for 18 months. Shortness of breath.

EXAM:
CT CHEST WITH CONTRAST
TECHNIQUE: Multidetector CT imaging of the chest was performed during
intravenous contrast administration.
CONTRAST:  75mL 9MUNK1-4MM IOPAMIDOL (9MUNK1-4MM) INJECTION 61%

[Series 2: axial st · axial · 0.63mm/px · z∈[-331,-59]mm · 12 of 160 slices shown, 15 images]
[im 12/160  mediastinal]
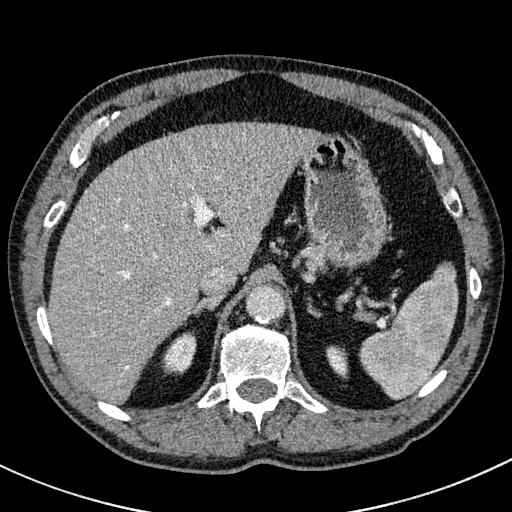
[im 12/160  lung]
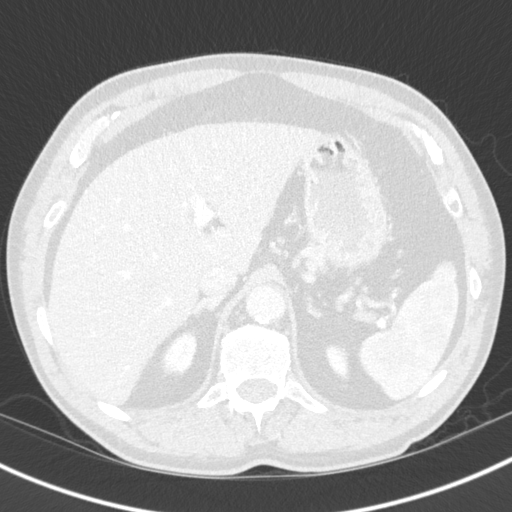
[im 24/160  lung]
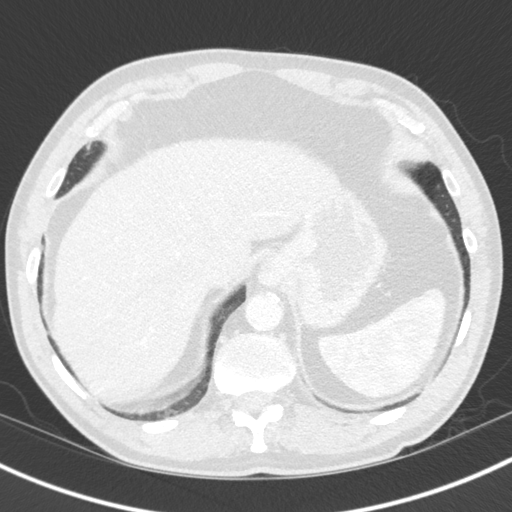
[im 36/160  lung]
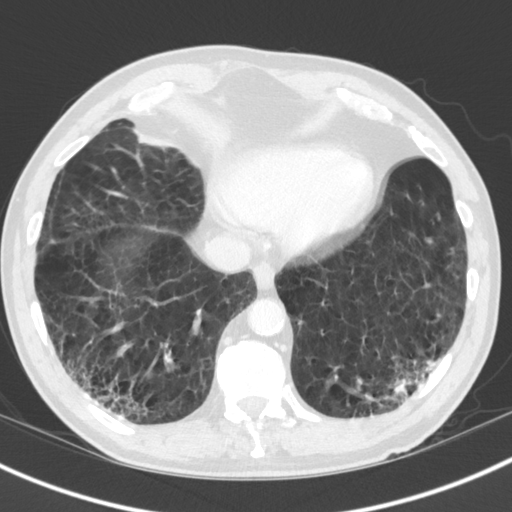
[im 48/160  lung]
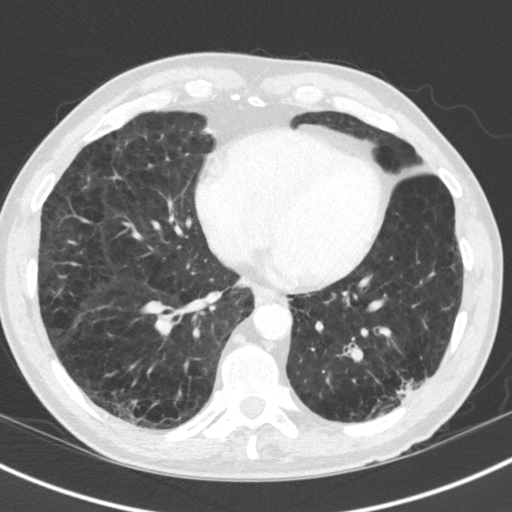
[im 59/160  mediastinal]
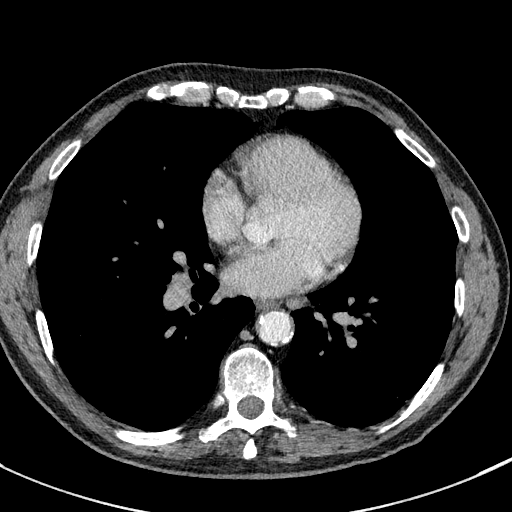
[im 59/160  lung]
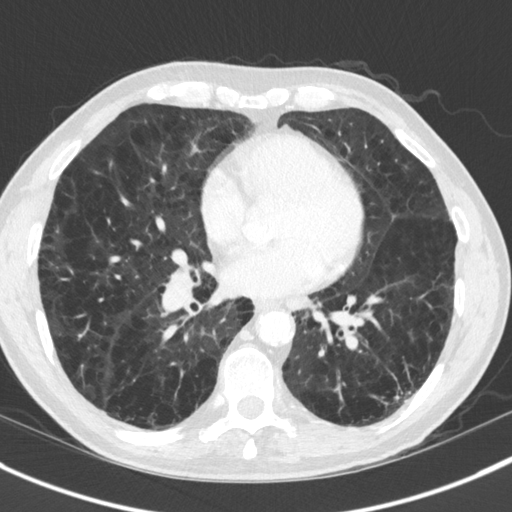
[im 71/160  lung]
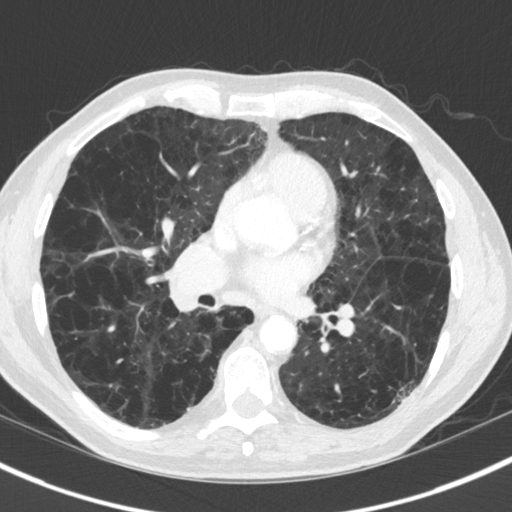
[im 89/160  lung]
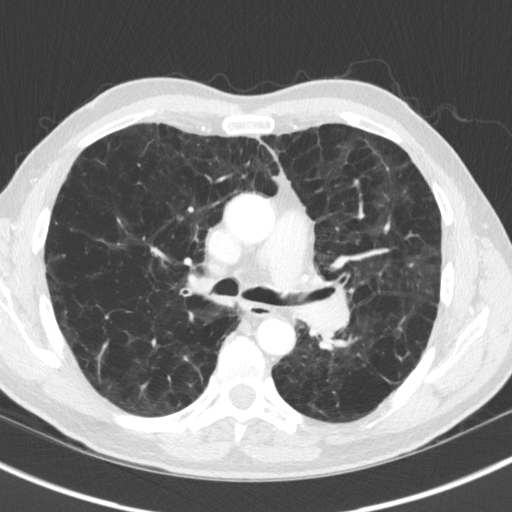
[im 101/160  lung]
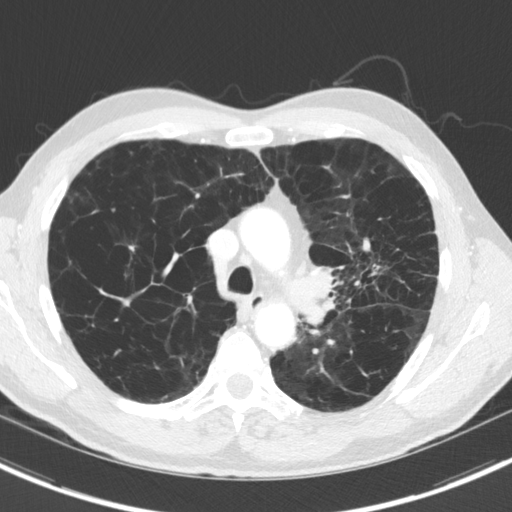
[im 112/160  mediastinal]
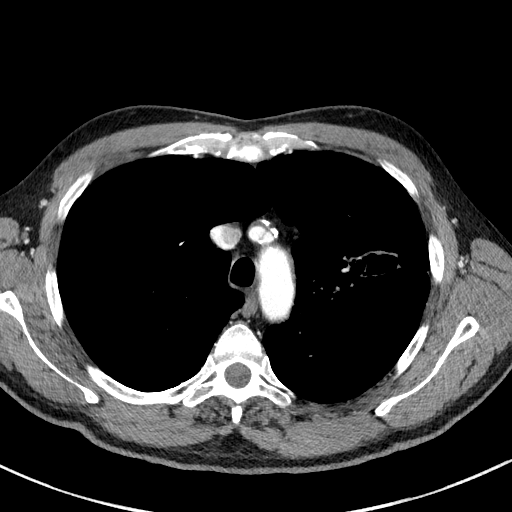
[im 112/160  lung]
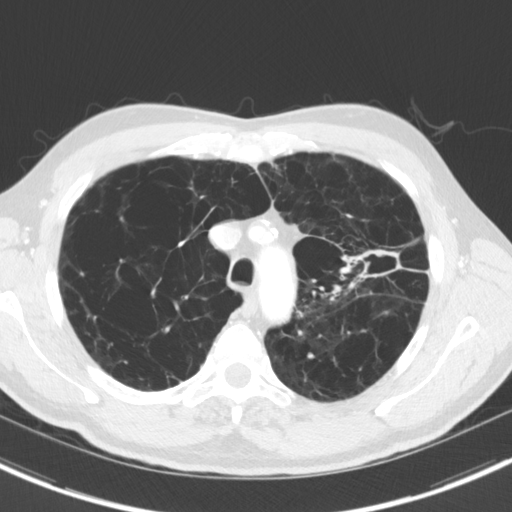
[im 124/160  lung]
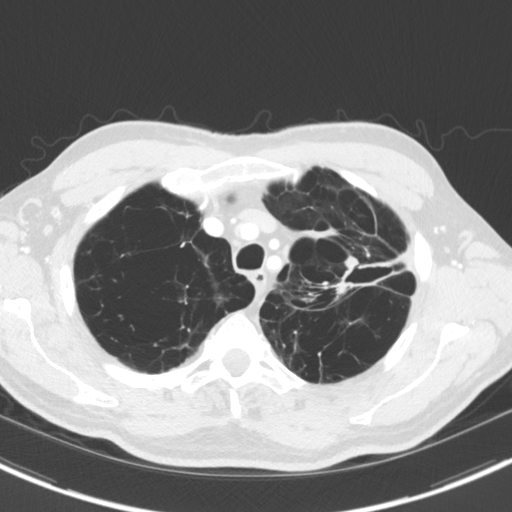
[im 136/160  lung]
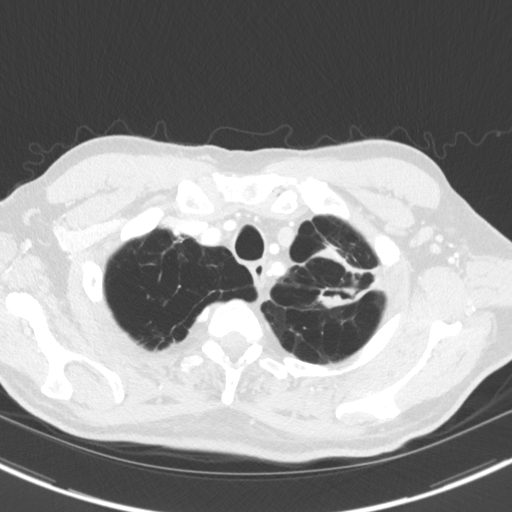
[im 148/160  lung]
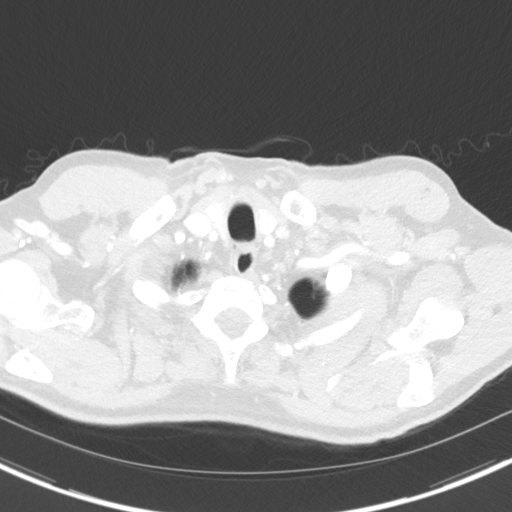

[Series 6: coronal · coronal · 0.65mm/px · 3 of 134 slices shown]
[im 27/134  lung]
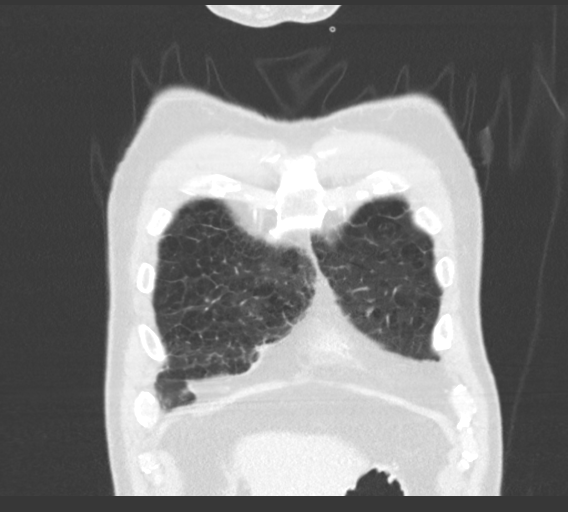
[im 54/134  lung]
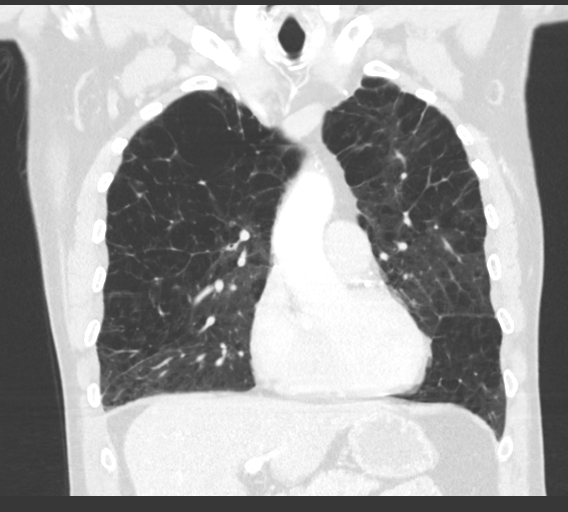
[im 80/134  lung]
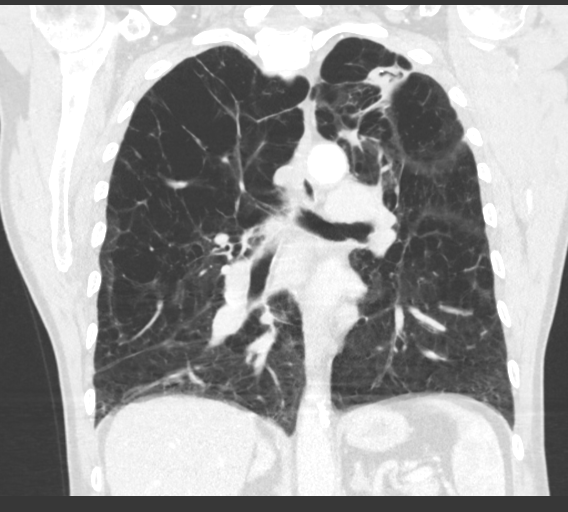

[15 of 36 positions shown; findings below may reference images not displayed]

FINDINGS: Cardiovascular: Coronary artery calcifications are identified. The
heart is unchanged. Atherosclerotic changes seen in the non
aneurysmal thoracic aorta. Pulmonary arteries are stable and
unchanged.

Mediastinum/Nodes: Lymph nodes are stable in the interval with no
suspicious adenopathy.

Lungs/Pleura: Severe emphysematous changes are noted. No
pneumothorax. Central airways are stable. No effusions. Subpleural
honeycomb formation posteriorly in both lower lobes is similar in
the interval, likely due to an interstitial fibrotic process. The
large cavitary a left upper lobe mass remains difficult to measure
given its irregular shape. Measuring the mass on series 4, image 42,
a similar level with previous site of measurement, the mass measures
4.8 x 2.6 by 6.1 cm today versus 5.1 x 3.3 x 6.2 cm previously. The
cavitary component appears larger and the thickened peripheral soft
tissue component appear less thickened in the interval. The separate
nodule seen on series 3, image 42 of the previous study is seen on
image 44 today measuring 17 by a 6 mm today versus 18 x 9 mm
previously. No other nodules or masses. No other change.

Upper Abdomen: No interval changes.

Musculoskeletal: No interval change.
IMPRESSION: 1. The cavitary mass in the left upper lobe is a little smaller in
the interval with continued but improved peripheral wall thickening.
The finding is most consistent with a chronic inflammatory process.
2. The new irregular nodule seen medial to the cavitary upper lobe
mass on the previous study has improved as well. Recommend continued
attention on follow-up.
3. Severe chronic lung disease with emphysema and subpleural
honeycomb formation and both lower lobes.
4. Atherosclerotic change in the thoracic aorta.

Aortic Atherosclerosis ([id number]-UKV.V).

## 2018-05-02 NOTE — Assessment & Plan Note (Signed)
No overt GI bleeding. Last colonoscopy in 2012 normal. No recent iron studies, with last in 2017 noting iron low at 26 and ferritin 94. Multifactorial in this setting. Would recommend completed EGD and addressing upper GI symptoms first, then will reassess when he returns to the office.

## 2018-05-02 NOTE — Assessment & Plan Note (Addendum)
57 year old male with remote history of dysphagia, last EGD by Dr. Michail Sermon in Oct 2016 during an admission with minimal erosive esophagitis and hiatal hernia, remote history of Barrett's on EGD at [facility] 2011. Worsening solid food dysphagia since March/April, tolerating soft foods and liquids. Worsening GERD symptoms as well despite Prilosec 20 mg daily. Although long-standing COPD and history of chronic cavitary lung lesions dating back many years, he is at baseline for respiratory status but requires 6 liters nasal cannula. Due to multiple comorbidities, will have procedure completed with Propofol.   Proceed with upper endoscopy/dilation in the near future with Dr. Gala Romney. The risks, benefits, and alternatives have been discussed in detail with patient. They have stated understanding and desire to proceed.  Propofol  Increase Prilosec to BID Return in 3-4 months

## 2018-05-03 NOTE — Progress Notes (Signed)
CC'D TO PCP °

## 2018-05-04 ENCOUNTER — Telehealth: Payer: Self-pay | Admitting: *Deleted

## 2018-05-04 NOTE — Telephone Encounter (Signed)
Received call from [place] in Endo. Wants to see if patient is willing to move up procedure time in 05/06/18. LMOVM.

## 2018-05-05 NOTE — Telephone Encounter (Signed)
LMOVM

## 2018-05-05 NOTE — Telephone Encounter (Signed)
Called made Ryan [name] aware unable to reach patient

## 2018-05-06 ENCOUNTER — Other Ambulatory Visit: Payer: Self-pay

## 2018-05-06 ENCOUNTER — Ambulatory Visit (HOSPITAL_COMMUNITY): Payer: Medicare Other | Admitting: Anesthesiology

## 2018-05-06 ENCOUNTER — Ambulatory Visit (HOSPITAL_COMMUNITY)
Admission: RE | Admit: 2018-05-06 | Discharge: 2018-05-06 | Disposition: A | Discharge status: Home / Self Care | Payer: Medicare Other | Source: Ambulatory Visit | Attending: Internal Medicine | Admitting: Internal Medicine

## 2018-05-06 ENCOUNTER — Encounter (HOSPITAL_COMMUNITY)
Admission: RE | Disposition: A | Discharge status: Home / Self Care | Payer: Self-pay | Source: Ambulatory Visit | Attending: Internal Medicine

## 2018-05-06 DIAGNOSIS — Z888 Allergy status to other drugs, medicaments and biological substances status: Secondary | ICD-10-CM | POA: Insufficient documentation

## 2018-05-06 DIAGNOSIS — G8929 Other chronic pain: Secondary | ICD-10-CM | POA: Insufficient documentation

## 2018-05-06 DIAGNOSIS — K449 Diaphragmatic hernia without obstruction or gangrene: Secondary | ICD-10-CM | POA: Insufficient documentation

## 2018-05-06 DIAGNOSIS — Z9981 Dependence on supplemental oxygen: Secondary | ICD-10-CM | POA: Diagnosis not present

## 2018-05-06 DIAGNOSIS — Z887 Allergy status to serum and vaccine status: Secondary | ICD-10-CM | POA: Insufficient documentation

## 2018-05-06 DIAGNOSIS — I2510 Atherosclerotic heart disease of native coronary artery without angina pectoris: Secondary | ICD-10-CM | POA: Diagnosis not present

## 2018-05-06 DIAGNOSIS — M5134 Other intervertebral disc degeneration, thoracic region: Secondary | ICD-10-CM | POA: Insufficient documentation

## 2018-05-06 DIAGNOSIS — J449 Chronic obstructive pulmonary disease, unspecified: Secondary | ICD-10-CM | POA: Diagnosis not present

## 2018-05-06 DIAGNOSIS — Z792 Long term (current) use of antibiotics: Secondary | ICD-10-CM | POA: Diagnosis not present

## 2018-05-06 DIAGNOSIS — K222 Esophageal obstruction: Secondary | ICD-10-CM | POA: Diagnosis not present

## 2018-05-06 DIAGNOSIS — R1310 Dysphagia, unspecified: Secondary | ICD-10-CM | POA: Diagnosis not present

## 2018-05-06 DIAGNOSIS — J9610 Chronic respiratory failure, unspecified whether with hypoxia or hypercapnia: Secondary | ICD-10-CM | POA: Diagnosis not present

## 2018-05-06 DIAGNOSIS — Z87891 Personal history of nicotine dependence: Secondary | ICD-10-CM | POA: Diagnosis not present

## 2018-05-06 DIAGNOSIS — K210 Gastro-esophageal reflux disease with esophagitis: Secondary | ICD-10-CM | POA: Diagnosis not present

## 2018-05-06 DIAGNOSIS — Z881 Allergy status to other antibiotic agents status: Secondary | ICD-10-CM | POA: Insufficient documentation

## 2018-05-06 DIAGNOSIS — K2210 Ulcer of esophagus without bleeding: Secondary | ICD-10-CM | POA: Insufficient documentation

## 2018-05-06 DIAGNOSIS — Z8719 Personal history of other diseases of the digestive system: Secondary | ICD-10-CM | POA: Diagnosis not present

## 2018-05-06 DIAGNOSIS — E119 Type 2 diabetes mellitus without complications: Secondary | ICD-10-CM | POA: Diagnosis not present

## 2018-05-06 DIAGNOSIS — M549 Dorsalgia, unspecified: Secondary | ICD-10-CM | POA: Diagnosis not present

## 2018-05-06 DIAGNOSIS — J45909 Unspecified asthma, uncomplicated: Secondary | ICD-10-CM | POA: Diagnosis not present

## 2018-05-06 DIAGNOSIS — E7800 Pure hypercholesterolemia, unspecified: Secondary | ICD-10-CM | POA: Diagnosis not present

## 2018-05-06 DIAGNOSIS — M359 Systemic involvement of connective tissue, unspecified: Secondary | ICD-10-CM | POA: Diagnosis not present

## 2018-05-06 DIAGNOSIS — K209 Esophagitis, unspecified: Secondary | ICD-10-CM

## 2018-05-06 DIAGNOSIS — Z8249 Family history of ischemic heart disease and other diseases of the circulatory system: Secondary | ICD-10-CM | POA: Insufficient documentation

## 2018-05-06 DIAGNOSIS — R569 Unspecified convulsions: Secondary | ICD-10-CM | POA: Diagnosis not present

## 2018-05-06 DIAGNOSIS — D751 Secondary polycythemia: Secondary | ICD-10-CM | POA: Diagnosis not present

## 2018-05-06 DIAGNOSIS — Z79899 Other long term (current) drug therapy: Secondary | ICD-10-CM | POA: Insufficient documentation

## 2018-05-06 DIAGNOSIS — M25519 Pain in unspecified shoulder: Secondary | ICD-10-CM | POA: Diagnosis not present

## 2018-05-06 DIAGNOSIS — M5030 Other cervical disc degeneration, unspecified cervical region: Secondary | ICD-10-CM | POA: Insufficient documentation

## 2018-05-06 DIAGNOSIS — Z7951 Long term (current) use of inhaled steroids: Secondary | ICD-10-CM | POA: Diagnosis not present

## 2018-05-06 DIAGNOSIS — Z833 Family history of diabetes mellitus: Secondary | ICD-10-CM | POA: Insufficient documentation

## 2018-05-06 DIAGNOSIS — I4891 Unspecified atrial fibrillation: Secondary | ICD-10-CM | POA: Insufficient documentation

## 2018-05-06 DIAGNOSIS — Z794 Long term (current) use of insulin: Secondary | ICD-10-CM | POA: Insufficient documentation

## 2018-05-06 HISTORY — PX: ESOPHAGOGASTRODUODENOSCOPY (EGD) WITH PROPOFOL: SHX5813

## 2018-05-06 HISTORY — PX: MALONEY DILATION: SHX5535

## 2018-05-06 LAB — KOH PREP

## 2018-05-06 LAB — GLUCOSE, CAPILLARY
GLUCOSE-CAPILLARY: 314 mg/dL — AB (ref 70–99)
Glucose-Capillary: 288 mg/dL — ABNORMAL HIGH (ref 70–99)

## 2018-05-06 SURGERY — ESOPHAGOGASTRODUODENOSCOPY (EGD) WITH PROPOFOL
Anesthesia: General

## 2018-05-06 MED ORDER — PROMETHAZINE HCL 25 MG/ML IJ SOLN: 6.2500 mg | INTRAMUSCULAR | Status: DC | PRN

## 2018-05-06 MED ORDER — MIDAZOLAM HCL 2 MG/2ML IJ SOLN: 0.5000 mg | Freq: Once | INTRAMUSCULAR | Status: DC | PRN

## 2018-05-06 MED ORDER — HYDROMORPHONE HCL 1 MG/ML IJ SOLN: 0.2500 mg | INTRAMUSCULAR | Status: DC | PRN

## 2018-05-06 MED ORDER — HYDROCODONE-ACETAMINOPHEN 7.5-325 MG PO TABS: 1.0000 | ORAL_TABLET | Freq: Once | ORAL | Status: DC | PRN

## 2018-05-06 MED ORDER — PROPOFOL 500 MG/50ML IV EMUL
INTRAVENOUS | Status: DC | PRN
  Administered 2018-05-06: 150 ug/kg/min via INTRAVENOUS

## 2018-05-06 MED ORDER — LACTATED RINGERS IV SOLN
INTRAVENOUS | Status: DC
  Administered 2018-05-06: 14:00:00 via INTRAVENOUS

## 2018-05-06 MED ORDER — PROPOFOL 10 MG/ML IV BOLUS
INTRAVENOUS | Status: DC | PRN
  Administered 2018-05-06: 15 mg via INTRAVENOUS

## 2018-05-06 MED ORDER — KETAMINE HCL 10 MG/ML IJ SOLN
INTRAMUSCULAR | Status: AC
  Filled 2018-05-06: qty 1

## 2018-05-06 MED ORDER — INSULIN ASPART 100 UNIT/ML ~~LOC~~ SOLN
5.0000 [IU] | Freq: Once | SUBCUTANEOUS | Status: AC
  Administered 2018-05-06: 5 [IU] via SUBCUTANEOUS
  Filled 2018-05-06: qty 0.05

## 2018-05-06 MED ORDER — STERILE WATER FOR IRRIGATION IR SOLN
Status: DC | PRN
  Administered 2018-05-06: 100 mL

## 2018-05-06 MED ORDER — CHLORHEXIDINE GLUCONATE CLOTH 2 % EX PADS: 6.0000 | MEDICATED_PAD | Freq: Once | CUTANEOUS | Status: DC

## 2018-05-06 MED ORDER — KETAMINE HCL 10 MG/ML IJ SOLN
INTRAMUSCULAR | Status: DC | PRN
  Administered 2018-05-06 (×2): 10 mg via INTRAVENOUS

## 2018-05-06 NOTE — Transfer of Care (Signed)
Immediate Anesthesia Transfer of Care Note  Patient: Ryan [name]  Procedure(s) Performed: ESOPHAGOGASTRODUODENOSCOPY (EGD) WITH PROPOFOL (N/A ) MALONEY DILATION (N/A ) ESOPHAGEAL BRUSHING  Patient Location: PACU  Anesthesia Type:General  Level of Consciousness: awake, alert  and patient cooperative  Airway & Oxygen Therapy: Patient Spontanous Breathing and Patient connected to nasal cannula oxygen  Post-op Assessment: Report given to RN and Post -op Vital signs reviewed and stable  Post vital signs: Reviewed and stable  Last Vitals:  Vitals Value Taken Time  BP 114/77 05/06/2018  2:17 PM  Temp 37.1 C 05/06/2018  2:17 PM  Pulse 79 05/06/2018  2:19 PM  Resp 19 05/06/2018  2:19 PM  SpO2 99 % 05/06/2018  2:19 PM  Vitals shown include unvalidated device data.  Last Pain:  Vitals:   05/06/18 1411  TempSrc:   PainSc: 0-No pain      Patients Stated Pain Goal: 5 (07/86/75 4492)  Complications: No apparent anesthesia complications

## 2018-05-06 NOTE — Anesthesia Procedure Notes (Signed)
Procedure Name: MAC Date/Time: 05/06/2018 1:52 PM Performed by: Vista Deck, CRNA Pre-anesthesia Checklist: Patient identified, Emergency Drugs available, Suction available, Timeout performed and Patient being monitored Patient Re-evaluated:Patient Re-evaluated prior to induction Oxygen Delivery Method: Nasal Cannula

## 2018-05-06 NOTE — Anesthesia Preprocedure Evaluation (Addendum)
Anesthesia Evaluation  Patient identified by MRN, date of birth, ID band Patient awake    Reviewed: Allergy & Precautions, NPO status , Patient's Chart, lab work & pertinent test results  Airway Mallampati: I  TM Distance: >3 FB Neck ROM: Full    Dental no notable dental hx. (+) Edentulous Upper, Edentulous Lower   Pulmonary asthma , pneumonia, resolved, COPD,  COPD inhaler and oxygen dependent, former smoker,  On 6l o2 [place]  States former smoker from 9 months ago   Pulmonary exam normal  + decreased breath sounds+ wheezing  rales    Cardiovascular Exercise Tolerance: Poor + CAD and + DOE  Normal cardiovascular exam+ dysrhythmias Atrial Fibrillation II Rhythm:Regular Rate:Normal  Very low ET Denies recent CP Does have episodes of Afib - remains anticoagulated   Neuro/Psych Seizures -, Well Controlled,  negative psych ROS   GI/Hepatic Neg liver ROS, GERD  Medicated and Controlled,  Endo/Other  negative endocrine ROSdiabetes, Well Controlled, Type 1, Insulin DependentGlc -314 states that's  good for him  Renal/GU negative Renal ROS  negative genitourinary   Musculoskeletal  (+) Arthritis , Osteoarthritis,    Abdominal   Peds negative pediatric ROS (+)  Hematology negative hematology ROS (+) anemia ,   Anesthesia Other Findings   Reproductive/Obstetrics negative OB ROS                            Anesthesia Physical Anesthesia Plan  ASA: IV  Anesthesia Plan: General   Post-op Pain Management:    Induction: Intravenous  PONV Risk Score and Plan:   Airway Management Planned: Nasal Cannula and Simple Face Mask  Additional Equipment:   Intra-op Plan:   Post-operative Plan: Extubation in OR  Informed Consent: I have reviewed the patients History and Physical, chart, labs and discussed the procedure including the risks, benefits and alternatives for the proposed anesthesia with the  patient or authorized representative who has indicated his/her understanding and acceptance.   Dental advisory given  Plan Discussed with: CRNA  Anesthesia Plan Comments: (ETT as needed d/w pt)        Anesthesia Quick Evaluation

## 2018-05-06 NOTE — Op Note (Signed)
[facility] Patient Name: Ryan [name] Procedure Date: 05/06/2018 1:32 PM MRN: [id number] Date of Birth: 10-Aug-1960 Attending MD: Norvel Richards , MD CSN: [id number] Age: 57 Admit Type: Outpatient Procedure:                Upper GI endoscopy Indications:              Dysphagia Providers:                Norvel Richards, MD, Rosina Lowenstein, RN, Nelma Rothman, Technician Referring MD:             Jasper Loser. Luan Pulling MD, MD Medicines:                Propofol per Anesthesia Complications:            No immediate complications. Estimated Blood Loss:     Estimated blood loss was minimal. Procedure:                Pre-Anesthesia Assessment:                           - Prior to the procedure, a History and Physical                            was performed, and patient medications and                            allergies were reviewed. The patient's tolerance of                            previous anesthesia was also reviewed. The risks                            and benefits of the procedure and the sedation                            options and risks were discussed with the patient.                            All questions were answered, and informed consent                            was obtained. Prior Anticoagulants: The patient has                            taken no previous anticoagulant or antiplatelet                            agents. ASA Grade Assessment: IV - A patient with                            severe systemic disease that is a constant threat  to life. After reviewing the risks and benefits,                            the patient was deemed in satisfactory condition to                            undergo the procedure.                           After obtaining informed consent, the endoscope was                            passed under direct vision. Throughout the                            procedure, the  patient's blood pressure, pulse, and                            oxygen saturations were monitored continuously. The                            [id number]-[id number] ([id number]) scope was introduced through the                            and advanced to the second part of duodenum. Scope In: 2:01:53 PM Scope Out: 2:08:31 PM Total Procedure Duration: 0 hours 6 minutes 38 seconds  Findings:      Esophagitis was found. 4 quadrant raised linear cream/orange-colored       plaques covering most of the tubular esophagus highly suspicious for       Candida esophagitis. Noncritical Schatzki's ring GE junction. Overlying       distal esophageal erosions. No mass. No Barrett's epithelium seen. Small       hiatal hernia. Normal stomach. Normal D1 and D2. 56 French Maloney       dilator was passed to full insertion with mild resistance. A look back       revealed the ring had been nicely ruptured without apparent complication.      Finally, esophageal mucosa brush for KOH prep. Impression:               - Esophagitis -Erosive reflux and likely Candida in                            origin. Schatzki's ring. Status post dilation and                            brushing. Hiatal hernia.                           - Moderate Sedation:      Moderate (conscious) sedation was personally administered by an       anesthesia professional. The following parameters were monitored: oxygen       saturation, heart rate, blood pressure, respiratory rate, EKG, adequacy       of pulmonary ventilation, and response to care. Recommendation:           -  Patient has a contact number available for                            emergencies. The signs and symptoms of potential                            delayed complications were discussed with the                            patient. Return to normal activities tomorrow.                            Written discharge instructions were provided to the                            patient.                            - Advance diet as tolerated. Continue twice daily                            Prilosec. Further recommendations to follow. Procedure Code(s):        --- Professional ---                           [phone], Esophagogastroduodenoscopy, flexible,                            transoral; diagnostic, including collection of                            specimen(s) by brushing or washing, when performed                            (separate procedure) Diagnosis Code(s):        --- Professional ---                           K20.9, Esophagitis, unspecified                           R13.10, Dysphagia, unspecified CPT copyright 2018 American Medical Association. All rights reserved. The codes documented in this report are preliminary and upon coder review may  be revised to meet current compliance requirements. Cristopher Estimable. Elijan Googe, MD Norvel Richards, MD 05/06/2018 2:20:57 PM This report has been signed electronically. Number of Addenda: 0

## 2018-05-06 NOTE — Anesthesia Postprocedure Evaluation (Signed)
Anesthesia Post Note  Patient: Ryan [name]  Procedure(s) Performed: ESOPHAGOGASTRODUODENOSCOPY (EGD) WITH PROPOFOL (N/A ) MALONEY DILATION (N/A ) ESOPHAGEAL BRUSHING  Patient location during evaluation: PACU Anesthesia Type: General Level of consciousness: awake and alert and patient cooperative Pain management: satisfactory to patient Vital Signs Assessment: post-procedure vital signs reviewed and stable Respiratory status: spontaneous breathing and patient connected to nasal cannula oxygen Cardiovascular status: stable Postop Assessment: no apparent nausea or vomiting Anesthetic complications: no     Last Vitals:  Vitals:   05/06/18 1417 05/06/18 1430  BP: 114/77 123/77  Pulse: 79 74  Resp: (!) 22 16  Temp: 37.1 C   SpO2: 98% 99%    Last Pain:  Vitals:   05/06/18 1430  TempSrc:   PainSc: 0-No pain                 Zykerria Tanton

## 2018-05-06 NOTE — Interval H&P Note (Signed)
History and Physical Interval Note:  05/06/2018 1:29 PM  Ryan [name]  has presented today for surgery, with the diagnosis of dysphagia  The various methods of treatment have been discussed with the patient and family. After consideration of risks, benefits and other options for treatment, the patient has consented to  Procedure(s) with comments: ESOPHAGOGASTRODUODENOSCOPY (EGD) WITH PROPOFOL (N/A) - 2:30pm MALONEY DILATION (N/A) as a surgical intervention .  The patient's history has been reviewed, patient examined, no change in status, stable for surgery.  I have reviewed the patient's chart and labs.  Questions were answered to the patient's satisfaction.     Ryan [name]    No change.  EGD with ED per plan.  The risks, benefits, limitations, alternatives and imponderables have been reviewed with the patient. Potential for esophageal dilation, biopsy, etc. have also been reviewed.  Questions have been answered. All parties agreeable.

## 2018-05-06 NOTE — Discharge Instructions (Signed)
EGD Discharge instructions Please read the instructions outlined below and refer to this sheet in the next few weeks. These discharge instructions provide you with general information on caring for yourself after you leave the hospital. Your doctor may also give you specific instructions. While your treatment has been planned according to the most current medical practices available, unavoidable complications occasionally occur. If you have any problems or questions after discharge, please call your doctor. ACTIVITY  You may resume your regular activity but move at a slower pace for the next 24 hours.   Take frequent rest periods for the next 24 hours.   Walking will help expel (get rid of) the air and reduce the bloated feeling in your abdomen.   No driving for 24 hours (because of the anesthesia (medicine) used during the test).   You may shower.   Do not sign any important legal documents or operate any machinery for 24 hours (because of the anesthesia used during the test).  NUTRITION  Drink plenty of fluids.   You may resume your normal diet.   Begin with a light meal and progress to your normal diet.   Avoid alcoholic beverages for 24 hours or as instructed by your caregiver.  MEDICATIONS  You may resume your normal medications unless your caregiver tells you otherwise.  WHAT YOU CAN EXPECT TODAY  You may experience abdominal discomfort such as a feeling of fullness or gas pains.  FOLLOW-UP  Your doctor will discuss the results of your test with you.  SEEK IMMEDIATE MEDICAL ATTENTION IF ANY OF THE FOLLOWING OCCUR:  Excessive nausea (feeling sick to your stomach) and/or vomiting.   Severe abdominal pain and distention (swelling).   Trouble swallowing.   Temperature over 101 F (37.8 C).   Rectal bleeding or vomiting of blood.    GERD information provided  Continue Prilosec 20 mg twice daily  Further recommendations to follow pending review of laboratory  testing  Office visit with [place] in 3 months     Gastroesophageal Reflux Disease, Adult Normally, food travels down the esophagus and stays in the stomach to be digested. If a person has gastroesophageal reflux disease (GERD), food and stomach acid move back up into the esophagus. When this happens, the esophagus becomes sore and swollen (inflamed). Over time, GERD can make small holes (ulcers) in the lining of the esophagus. Follow these instructions at home: Diet  Follow a diet as told by your doctor. You may need to avoid foods and drinks such as: ? Coffee and tea (with or without caffeine). ? Drinks that contain alcohol. ? Energy drinks and sports drinks. ? Carbonated drinks or sodas. ? Chocolate and cocoa. ? Peppermint and mint flavorings. ? Garlic and onions. ? Horseradish. ? Spicy and acidic foods, such as peppers, chili powder, curry powder, vinegar, hot sauces, and BBQ sauce. ? Citrus fruit juices and citrus fruits, such as oranges, lemons, and limes. ? Tomato-based foods, such as red sauce, chili, salsa, and pizza with red sauce. ? Fried and fatty foods, such as donuts, french fries, potato chips, and high-fat dressings. ? High-fat meats, such as hot dogs, rib eye steak, sausage, ham, and bacon. ? High-fat dairy items, such as whole milk, butter, and cream cheese.  Eat small meals often. Avoid eating large meals.  Avoid drinking large amounts of liquid with your meals.  Avoid eating meals during the 2-3 hours before bedtime.  Avoid lying down right after you eat.  Do not exercise right after you  eat. General instructions  Pay attention to any changes in your symptoms.  Take over-the-counter and prescription medicines only as told by your doctor. Do not take aspirin, ibuprofen, or other NSAIDs unless your doctor says it is okay.  Do not use any tobacco products, including cigarettes, chewing tobacco, and e-cigarettes. If you need help quitting, ask your  doctor.  Wear loose clothes. Do not wear anything tight around your waist.  Raise (elevate) the head of your bed about 6 inches (15 cm).  Try to lower your stress. If you need help doing this, ask your doctor.  If you are overweight, lose an amount of weight that is healthy for you. Ask your doctor about a safe weight loss goal.  Keep all follow-up visits as told by your doctor. This is important. Contact a doctor if:  You have new symptoms.  You lose weight and you do not know why it is happening.  You have trouble swallowing, or it hurts to swallow.  You have wheezing or a cough that keeps happening.  Your symptoms do not get better with treatment.  You have a hoarse voice. Get help right away if:  You have pain in your arms, neck, jaw, teeth, or back.  You feel sweaty, dizzy, or light-headed.  You have chest pain or shortness of breath.  You throw up (vomit) and your throw up looks like blood or coffee grounds.  You pass out (faint).  Your poop (stool) is bloody or black.  You cannot swallow, drink, or eat. This information is not intended to replace advice given to you by your health care provider. Make sure you discuss any questions you have with your health care provider. Document Released: 12/17/2007 Document Revised: 12/06/2015 Document Reviewed: 10/25/2014 Elsevier Interactive Patient Education  2018 [place], Care After These instructions provide you with information about caring for yourself after your procedure. Your health care provider may also give you more specific instructions. Your treatment has been planned according to current medical practices, but problems sometimes occur. Call your health care provider if you have any problems or questions after your procedure. What can I expect after the procedure? After your procedure, it is common to:  Feel sleepy for several hours.  Feel clumsy and have poor balance  for several hours.  Feel forgetful about what happened after the procedure.  Have poor judgment for several hours.  Feel nauseous or vomit.  Have a sore throat if you had a breathing tube during the procedure.  Follow these instructions at home: For at least 24 hours after the procedure:   Do not: ? Participate in activities in which you could fall or become injured. ? Drive. ? Use heavy machinery. ? Drink alcohol. ? Take sleeping pills or medicines that cause drowsiness. ? Make important decisions or sign legal documents. ? Take care of children on your own.  Rest. Eating and drinking  Follow the diet that is recommended by your health care provider.  If you vomit, drink water, juice, or soup when you can drink without vomiting.  Make sure you have little or no nausea before eating solid foods. General instructions  Have a responsible adult stay with you until you are awake and alert.  Take over-the-counter and prescription medicines only as told by your health care provider.  If you smoke, do not smoke without supervision.  Keep all follow-up visits as told by your health care provider. This is important.  Contact a health care provider if:  You keep feeling nauseous or you keep vomiting.  You feel light-headed.  You develop a rash.  You have a fever. Get help right away if:  You have trouble breathing. This information is not intended to replace advice given to you by your health care provider. Make sure you discuss any questions you have with your health care provider. Document Released: 10/21/2015 Document Revised: 02/20/2016 Document Reviewed: 10/21/2015 Elsevier Interactive Patient Education  [employer].

## 2018-05-12 ENCOUNTER — Encounter (HOSPITAL_COMMUNITY): Payer: Self-pay | Admitting: Internal Medicine

## 2018-05-24 DIAGNOSIS — J449 Chronic obstructive pulmonary disease, unspecified: Secondary | ICD-10-CM | POA: Diagnosis not present

## 2018-05-31 DIAGNOSIS — M545 Low back pain: Secondary | ICD-10-CM | POA: Diagnosis not present

## 2018-05-31 DIAGNOSIS — J441 Chronic obstructive pulmonary disease with (acute) exacerbation: Secondary | ICD-10-CM | POA: Diagnosis not present

## 2018-05-31 DIAGNOSIS — I5032 Chronic diastolic (congestive) heart failure: Secondary | ICD-10-CM | POA: Diagnosis not present

## 2018-05-31 DIAGNOSIS — E1165 Type 2 diabetes mellitus with hyperglycemia: Secondary | ICD-10-CM | POA: Diagnosis not present

## 2018-06-19 ENCOUNTER — Other Ambulatory Visit: Payer: Self-pay

## 2018-06-19 ENCOUNTER — Encounter (HOSPITAL_COMMUNITY): Payer: Self-pay | Admitting: *Deleted

## 2018-06-19 ENCOUNTER — Emergency Department (HOSPITAL_COMMUNITY): Payer: Medicare Other

## 2018-06-19 ENCOUNTER — Inpatient Hospital Stay (HOSPITAL_COMMUNITY)
Admission: EM | Admit: 2018-06-19 | Discharge: 2018-06-28 | DRG: 190 | Disposition: A | Discharge status: Home / Self Care | Payer: Medicare Other | Attending: Pulmonary Disease | Admitting: Pulmonary Disease

## 2018-06-19 DIAGNOSIS — R079 Chest pain, unspecified: Secondary | ICD-10-CM

## 2018-06-19 DIAGNOSIS — K219 Gastro-esophageal reflux disease without esophagitis: Secondary | ICD-10-CM | POA: Diagnosis not present

## 2018-06-19 DIAGNOSIS — G8929 Other chronic pain: Secondary | ICD-10-CM | POA: Diagnosis present

## 2018-06-19 DIAGNOSIS — Z881 Allergy status to other antibiotic agents status: Secondary | ICD-10-CM

## 2018-06-19 DIAGNOSIS — Z833 Family history of diabetes mellitus: Secondary | ICD-10-CM

## 2018-06-19 DIAGNOSIS — J9621 Acute and chronic respiratory failure with hypoxia: Secondary | ICD-10-CM | POA: Diagnosis present

## 2018-06-19 DIAGNOSIS — Z7984 Long term (current) use of oral hypoglycemic drugs: Secondary | ICD-10-CM

## 2018-06-19 DIAGNOSIS — J8410 Pulmonary fibrosis, unspecified: Secondary | ICD-10-CM | POA: Diagnosis not present

## 2018-06-19 DIAGNOSIS — Z794 Long term (current) use of insulin: Secondary | ICD-10-CM

## 2018-06-19 DIAGNOSIS — A310 Pulmonary mycobacterial infection: Secondary | ICD-10-CM | POA: Diagnosis not present

## 2018-06-19 DIAGNOSIS — J441 Chronic obstructive pulmonary disease with (acute) exacerbation: Secondary | ICD-10-CM | POA: Diagnosis not present

## 2018-06-19 DIAGNOSIS — R072 Precordial pain: Secondary | ICD-10-CM

## 2018-06-19 DIAGNOSIS — R918 Other nonspecific abnormal finding of lung field: Secondary | ICD-10-CM | POA: Diagnosis present

## 2018-06-19 DIAGNOSIS — Z7951 Long term (current) use of inhaled steroids: Secondary | ICD-10-CM

## 2018-06-19 DIAGNOSIS — Z87891 Personal history of nicotine dependence: Secondary | ICD-10-CM | POA: Diagnosis not present

## 2018-06-19 DIAGNOSIS — J9611 Chronic respiratory failure with hypoxia: Secondary | ICD-10-CM

## 2018-06-19 DIAGNOSIS — Z79899 Other long term (current) drug therapy: Secondary | ICD-10-CM

## 2018-06-19 DIAGNOSIS — I480 Paroxysmal atrial fibrillation: Secondary | ICD-10-CM | POA: Diagnosis not present

## 2018-06-19 DIAGNOSIS — J449 Chronic obstructive pulmonary disease, unspecified: Secondary | ICD-10-CM | POA: Diagnosis not present

## 2018-06-19 DIAGNOSIS — I2510 Atherosclerotic heart disease of native coronary artery without angina pectoris: Secondary | ICD-10-CM | POA: Diagnosis not present

## 2018-06-19 DIAGNOSIS — M545 Low back pain: Secondary | ICD-10-CM | POA: Diagnosis present

## 2018-06-19 DIAGNOSIS — Z79891 Long term (current) use of opiate analgesic: Secondary | ICD-10-CM

## 2018-06-19 DIAGNOSIS — K2270 Barrett's esophagus without dysplasia: Secondary | ICD-10-CM | POA: Diagnosis present

## 2018-06-19 DIAGNOSIS — E119 Type 2 diabetes mellitus without complications: Secondary | ICD-10-CM | POA: Diagnosis not present

## 2018-06-19 DIAGNOSIS — I4891 Unspecified atrial fibrillation: Secondary | ICD-10-CM | POA: Diagnosis present

## 2018-06-19 DIAGNOSIS — Z887 Allergy status to serum and vaccine status: Secondary | ICD-10-CM | POA: Diagnosis not present

## 2018-06-19 DIAGNOSIS — J984 Other disorders of lung: Secondary | ICD-10-CM | POA: Diagnosis not present

## 2018-06-19 DIAGNOSIS — K222 Esophageal obstruction: Secondary | ICD-10-CM | POA: Diagnosis not present

## 2018-06-19 DIAGNOSIS — E7800 Pure hypercholesterolemia, unspecified: Secondary | ICD-10-CM | POA: Diagnosis present

## 2018-06-19 DIAGNOSIS — Z888 Allergy status to other drugs, medicaments and biological substances status: Secondary | ICD-10-CM | POA: Diagnosis not present

## 2018-06-19 DIAGNOSIS — I5043 Acute on chronic combined systolic (congestive) and diastolic (congestive) heart failure: Secondary | ICD-10-CM | POA: Diagnosis not present

## 2018-06-19 DIAGNOSIS — J439 Emphysema, unspecified: Secondary | ICD-10-CM | POA: Diagnosis not present

## 2018-06-19 DIAGNOSIS — Z8701 Personal history of pneumonia (recurrent): Secondary | ICD-10-CM

## 2018-06-19 DIAGNOSIS — Z8249 Family history of ischemic heart disease and other diseases of the circulatory system: Secondary | ICD-10-CM

## 2018-06-19 DIAGNOSIS — F419 Anxiety disorder, unspecified: Secondary | ICD-10-CM | POA: Diagnosis present

## 2018-06-19 LAB — CBC WITH DIFFERENTIAL/PLATELET
Abs Immature Granulocytes: 0.03 10*3/uL (ref 0.00–0.07)
BASOS PCT: 1 %
Basophils Absolute: 0 10*3/uL (ref 0.0–0.1)
EOS ABS: 0.2 10*3/uL (ref 0.0–0.5)
Eosinophils Relative: 4 %
HCT: 39.4 % (ref 39.0–52.0)
Hemoglobin: 12.4 g/dL — ABNORMAL LOW (ref 13.0–17.0)
Immature Granulocytes: 1 %
Lymphocytes Relative: 22 %
Lymphs Abs: 1.3 10*3/uL (ref 0.7–4.0)
MCH: 27.2 pg (ref 26.0–34.0)
MCHC: 31.5 g/dL (ref 30.0–36.0)
MCV: 86.4 fL (ref 80.0–100.0)
MONO ABS: 0.6 10*3/uL (ref 0.1–1.0)
Monocytes Relative: 11 %
Neutro Abs: 3.7 10*3/uL (ref 1.7–7.7)
Neutrophils Relative %: 61 %
PLATELETS: 164 10*3/uL (ref 150–400)
RBC: 4.56 MIL/uL (ref 4.22–5.81)
RDW: 13.4 % (ref 11.5–15.5)
WBC: 5.9 10*3/uL (ref 4.0–10.5)
nRBC: 0 % (ref 0.0–0.2)

## 2018-06-19 LAB — TROPONIN I: Troponin I: <0.03 ng/mL (ref <0.03)

## 2018-06-19 LAB — BASIC METABOLIC PANEL
Anion gap: 9 (ref 5–15)
BUN: 11 mg/dL (ref 6–20)
CALCIUM: 8.9 mg/dL (ref 8.9–10.3)
CO2: 27 mmol/L (ref 22–32)
Chloride: 99 mmol/L (ref 98–111)
Creatinine, Ser: 0.94 mg/dL (ref 0.61–1.24)
GFR calc Af Amer: >60 mL/min (ref >60)
GLUCOSE: 245 mg/dL — AB (ref 70–99)
Potassium: 4.5 mmol/L (ref 3.5–5.1)
Sodium: 135 mmol/L (ref 135–145)

## 2018-06-19 LAB — POCT I-STAT TROPONIN I: Troponin i, poc: 0 ng/mL (ref 0.00–0.08)

## 2018-06-19 LAB — GLUCOSE, CAPILLARY: Glucose-Capillary: 208 mg/dL — ABNORMAL HIGH (ref 70–99)

## 2018-06-19 MED ORDER — LEVALBUTEROL HCL 1.25 MG/0.5ML IN NEBU
INHALATION_SOLUTION | RESPIRATORY_TRACT | Status: AC
  Administered 2018-06-19: 1.25 mg
  Filled 2018-06-19: qty 0.5

## 2018-06-19 MED ORDER — PANTOPRAZOLE SODIUM 40 MG PO TBEC
40.0000 mg | DELAYED_RELEASE_TABLET | Freq: Every day | ORAL | Status: DC
  Administered 2018-06-20 – 2018-06-28 (×9): 40 mg via ORAL
  Filled 2018-06-19 (×9): qty 1

## 2018-06-19 MED ORDER — AZITHROMYCIN 250 MG PO TABS
500.0000 mg | ORAL_TABLET | Freq: Every day | ORAL | Status: DC
  Administered 2018-06-20 – 2018-06-28 (×9): 500 mg via ORAL
  Filled 2018-06-19 (×9): qty 2

## 2018-06-19 MED ORDER — OXYCODONE HCL 5 MG PO TABS
5.0000 mg | ORAL_TABLET | ORAL | Status: DC | PRN
  Administered 2018-06-19 – 2018-06-28 (×39): 5 mg via ORAL
  Filled 2018-06-19 (×39): qty 1

## 2018-06-19 MED ORDER — SODIUM CHLORIDE 0.9 % IV SOLN
INTRAVENOUS | Status: DC
  Administered 2018-06-19: 19:00:00 via INTRAVENOUS

## 2018-06-19 MED ORDER — ALUM & MAG HYDROXIDE-SIMETH 200-200-20 MG/5ML PO SUSP
30.0000 mL | Freq: Once | ORAL | Status: AC
  Administered 2018-06-19: 30 mL via ORAL
  Filled 2018-06-19: qty 30

## 2018-06-19 MED ORDER — ONDANSETRON HCL 4 MG/2ML IJ SOLN
4.0000 mg | Freq: Four times a day (QID) | INTRAMUSCULAR | Status: DC | PRN
  Administered 2018-06-23 – 2018-06-24 (×2): 4 mg via INTRAVENOUS
  Filled 2018-06-19 (×2): qty 2

## 2018-06-19 MED ORDER — KETOROLAC TROMETHAMINE 30 MG/ML IJ SOLN
30.0000 mg | Freq: Four times a day (QID) | INTRAMUSCULAR | Status: AC | PRN
  Administered 2018-06-19 – 2018-06-24 (×9): 30 mg via INTRAVENOUS
  Filled 2018-06-19 (×9): qty 1

## 2018-06-19 MED ORDER — ACETAMINOPHEN 325 MG PO TABS: 650.0000 mg | ORAL_TABLET | ORAL | Status: DC | PRN

## 2018-06-19 MED ORDER — IPRATROPIUM BROMIDE 0.02 % IN SOLN
0.5000 mg | Freq: Two times a day (BID) | RESPIRATORY_TRACT | Status: DC
  Administered 2018-06-20 – 2018-06-21 (×4): 0.5 mg via RESPIRATORY_TRACT
  Filled 2018-06-19 (×2): qty 2.5

## 2018-06-19 MED ORDER — MONTELUKAST SODIUM 10 MG PO TABS
10.0000 mg | ORAL_TABLET | Freq: Every day | ORAL | Status: DC
  Administered 2018-06-19 – 2018-06-27 (×9): 10 mg via ORAL
  Filled 2018-06-19 (×9): qty 1

## 2018-06-19 MED ORDER — ASPIRIN 81 MG PO CHEW
324.0000 mg | CHEWABLE_TABLET | Freq: Once | ORAL | Status: AC
  Administered 2018-06-19: 324 mg via ORAL
  Filled 2018-06-19: qty 4

## 2018-06-19 MED ORDER — MORPHINE SULFATE (PF) 2 MG/ML IV SOLN
2.0000 mg | INTRAVENOUS | Status: DC | PRN
  Administered 2018-06-23 – 2018-06-28 (×10): 2 mg via INTRAVENOUS
  Filled 2018-06-19 (×10): qty 1

## 2018-06-19 MED ORDER — OXYCODONE-ACETAMINOPHEN 5-325 MG PO TABS
1.0000 | ORAL_TABLET | ORAL | Status: DC | PRN
  Administered 2018-06-19 – 2018-06-28 (×39): 1 via ORAL
  Filled 2018-06-19 (×39): qty 1

## 2018-06-19 MED ORDER — LIDOCAINE VISCOUS HCL 2 % MT SOLN
15.0000 mL | Freq: Once | OROMUCOSAL | Status: AC
  Administered 2018-06-19: 15 mL via ORAL
  Filled 2018-06-19: qty 15

## 2018-06-19 MED ORDER — BUDESONIDE 0.25 MG/2ML IN SUSP: 0.2500 mg | Freq: Two times a day (BID) | RESPIRATORY_TRACT | Status: DC

## 2018-06-19 MED ORDER — PREDNISONE 20 MG PO TABS
20.0000 mg | ORAL_TABLET | Freq: Every day | ORAL | Status: DC
  Administered 2018-06-20 – 2018-06-28 (×9): 20 mg via ORAL
  Filled 2018-06-19 (×9): qty 1

## 2018-06-19 MED ORDER — ROSUVASTATIN CALCIUM 10 MG PO TABS
10.0000 mg | ORAL_TABLET | Freq: Every day | ORAL | Status: DC
  Administered 2018-06-20 – 2018-06-28 (×9): 10 mg via ORAL
  Filled 2018-06-19 (×9): qty 1

## 2018-06-19 MED ORDER — LORAZEPAM 0.5 MG PO TABS
0.5000 mg | ORAL_TABLET | Freq: Four times a day (QID) | ORAL | Status: DC | PRN
  Administered 2018-06-19 – 2018-06-28 (×18): 0.5 mg via ORAL
  Filled 2018-06-19 (×18): qty 1

## 2018-06-19 MED ORDER — IOPAMIDOL (ISOVUE-370) INJECTION 76%
100.0000 mL | Freq: Once | INTRAVENOUS | Status: AC | PRN
  Administered 2018-06-19: 75 mL via INTRAVENOUS

## 2018-06-19 MED ORDER — DILTIAZEM HCL ER COATED BEADS 120 MG PO CP24
120.0000 mg | ORAL_CAPSULE | Freq: Every day | ORAL | Status: DC
  Administered 2018-06-20 – 2018-06-28 (×9): 120 mg via ORAL
  Filled 2018-06-19 (×9): qty 1

## 2018-06-19 MED ORDER — IPRATROPIUM-ALBUTEROL 0.5-2.5 (3) MG/3ML IN SOLN
3.0000 mL | Freq: Once | RESPIRATORY_TRACT | Status: DC
  Filled 2018-06-19 (×2): qty 3

## 2018-06-19 MED ORDER — OXYCODONE-ACETAMINOPHEN 10-325 MG PO TABS: 1.0000 | ORAL_TABLET | ORAL | Status: DC | PRN

## 2018-06-19 MED ORDER — LORATADINE 10 MG PO TABS
10.0000 mg | ORAL_TABLET | Freq: Every day | ORAL | Status: DC
  Administered 2018-06-20 – 2018-06-28 (×9): 10 mg via ORAL
  Filled 2018-06-19 (×9): qty 1

## 2018-06-19 MED ORDER — NITROGLYCERIN 0.4 MG SL SUBL
0.4000 mg | SUBLINGUAL_TABLET | SUBLINGUAL | Status: DC | PRN
  Administered 2018-06-19: 0.4 mg via SUBLINGUAL
  Filled 2018-06-19: qty 1

## 2018-06-19 MED ORDER — IPRATROPIUM BROMIDE 0.02 % IN SOLN
RESPIRATORY_TRACT | Status: AC
  Administered 2018-06-19: 0.5 mg
  Filled 2018-06-19: qty 2.5

## 2018-06-19 MED ORDER — ENOXAPARIN SODIUM 40 MG/0.4ML ~~LOC~~ SOLN
40.0000 mg | SUBCUTANEOUS | Status: DC
  Administered 2018-06-19 – 2018-06-27 (×9): 40 mg via SUBCUTANEOUS
  Filled 2018-06-19 (×9): qty 0.4

## 2018-06-19 MED ORDER — INSULIN ASPART 100 UNIT/ML ~~LOC~~ SOLN
0.0000 [IU] | Freq: Three times a day (TID) | SUBCUTANEOUS | Status: DC
  Administered 2018-06-20: 15 [IU] via SUBCUTANEOUS
  Administered 2018-06-20: 3 [IU] via SUBCUTANEOUS
  Administered 2018-06-20: 5 [IU] via SUBCUTANEOUS
  Administered 2018-06-21 (×2): 8 [IU] via SUBCUTANEOUS
  Administered 2018-06-21: 5 [IU] via SUBCUTANEOUS
  Administered 2018-06-22: 3 [IU] via SUBCUTANEOUS
  Administered 2018-06-22: 5 [IU] via SUBCUTANEOUS
  Administered 2018-06-22: 15 [IU] via SUBCUTANEOUS
  Administered 2018-06-23 (×2): 5 [IU] via SUBCUTANEOUS
  Administered 2018-06-23: 8 [IU] via SUBCUTANEOUS
  Administered 2018-06-24: 15 [IU] via SUBCUTANEOUS
  Administered 2018-06-24 (×2): 3 [IU] via SUBCUTANEOUS
  Administered 2018-06-25: 5 [IU] via SUBCUTANEOUS
  Administered 2018-06-26: 15 [IU] via SUBCUTANEOUS
  Administered 2018-06-26 (×2): 8 [IU] via SUBCUTANEOUS
  Administered 2018-06-27: 11 [IU] via SUBCUTANEOUS
  Administered 2018-06-27 – 2018-06-28 (×3): 5 [IU] via SUBCUTANEOUS

## 2018-06-19 MED ORDER — BENZONATATE 100 MG PO CAPS: 200.0000 mg | ORAL_CAPSULE | Freq: Three times a day (TID) | ORAL | Status: DC | PRN

## 2018-06-19 MED ORDER — INSULIN GLARGINE 100 UNIT/ML ~~LOC~~ SOLN
26.0000 [IU] | Freq: Every day | SUBCUTANEOUS | Status: DC
  Administered 2018-06-20 – 2018-06-28 (×9): 26 [IU] via SUBCUTANEOUS
  Filled 2018-06-19 (×10): qty 0.26

## 2018-06-19 MED ORDER — MORPHINE SULFATE (PF) 2 MG/ML IV SOLN
2.0000 mg | Freq: Once | INTRAVENOUS | Status: AC
  Administered 2018-06-19: 2 mg via INTRAVENOUS
  Filled 2018-06-19: qty 1

## 2018-06-19 MED ORDER — INSULIN ASPART 100 UNIT/ML ~~LOC~~ SOLN
0.0000 [IU] | Freq: Every day | SUBCUTANEOUS | Status: DC
  Administered 2018-06-19 – 2018-06-26 (×4): 2 [IU] via SUBCUTANEOUS
  Administered 2018-06-27: 4 [IU] via SUBCUTANEOUS

## 2018-06-19 MED ORDER — ALBUTEROL SULFATE (2.5 MG/3ML) 0.083% IN NEBU: 3.0000 mL | INHALATION_SOLUTION | Freq: Four times a day (QID) | RESPIRATORY_TRACT | Status: DC | PRN

## 2018-06-19 MED ORDER — LEVALBUTEROL HCL 0.63 MG/3ML IN NEBU
0.6300 mg | INHALATION_SOLUTION | Freq: Two times a day (BID) | RESPIRATORY_TRACT | Status: DC
  Administered 2018-06-20 – 2018-06-21 (×4): 0.63 mg via RESPIRATORY_TRACT
  Filled 2018-06-19 (×3): qty 3

## 2018-06-19 MED ORDER — BACLOFEN 10 MG PO TABS
20.0000 mg | ORAL_TABLET | Freq: Three times a day (TID) | ORAL | Status: DC
  Administered 2018-06-19 – 2018-06-28 (×26): 20 mg via ORAL
  Filled 2018-06-19 (×26): qty 2

## 2018-06-19 MED ORDER — FLUTICASONE PROPIONATE 50 MCG/ACT NA SUSP
2.0000 | Freq: Every day | NASAL | Status: DC
  Administered 2018-06-20 – 2018-06-28 (×9): 2 via NASAL
  Filled 2018-06-19: qty 16

## 2018-06-19 MED ORDER — RIFAMPIN 300 MG PO CAPS
600.0000 mg | ORAL_CAPSULE | Freq: Every day | ORAL | Status: DC
  Administered 2018-06-20 – 2018-06-28 (×9): 600 mg via ORAL
  Filled 2018-06-19 (×11): qty 2

## 2018-06-19 MED ORDER — GUAIFENESIN ER 600 MG PO TB12
600.0000 mg | ORAL_TABLET | Freq: Three times a day (TID) | ORAL | Status: DC
  Administered 2018-06-19 – 2018-06-28 (×26): 600 mg via ORAL
  Filled 2018-06-19 (×26): qty 1

## 2018-06-19 MED ORDER — FLUTICASONE-UMECLIDIN-VILANT 100-62.5-25 MCG/INH IN AEPB
1.0000 | INHALATION_SPRAY | Freq: Every day | RESPIRATORY_TRACT | Status: DC
  Filled 2018-06-19 (×2): qty 60

## 2018-06-19 MED ORDER — ETHAMBUTOL HCL 400 MG PO TABS
1200.0000 mg | ORAL_TABLET | Freq: Every day | ORAL | Status: DC
  Administered 2018-06-20 – 2018-06-28 (×9): 1200 mg via ORAL
  Filled 2018-06-19 (×12): qty 3

## 2018-06-19 NOTE — ED Provider Notes (Signed)
[facility] EMERGENCY DEPARTMENT Provider Note   CSN: [id number] Arrival date & time: 06/19/18  1616     History   Chief Complaint Chief Complaint  Patient presents with  . Chest Pain    HPI Ryan [name] is a 57 y.o. male.  Patient with onset of left anterior chest pain started this morning at around 10 or 1030.  Associated with some shortness of breath the patient has COPD and is normally on 6 L of nasal cannula oxygen at all times.  No nausea or vomiting.  The chest pain is been constant.  Does not radiate to the back but is made worse by taking a deep breath.  Does have some low back pain.  No history of coronary artery disease.  The patient has significant risk factors.  Patient has known history of cavitary lesions of the long.  Has known chronic back pain.  Patient does have diabetes and hypercholesterol.     Past Medical History:  Diagnosis Date  . Atrial fibrillation ([place])    Not anticoagulated  . Barrett's esophagus   . Borderline diabetes   . Bronchitis   . CAP (community acquired pneumonia) 07/10/2013   04/2015  . 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 Chronic back pain   . Chronic left shoulder pain   . Chronic neck pain   . Chronic respiratory failure ([place])   . Collagen vascular disease ([place])   . COPD (chronic obstructive pulmonary disease) ([place])   . Coronary atherosclerosis of native coronary artery    Mild nonobstructive disease at catheterization 2007  . DDD (degenerative disc disease)    Cervical and thoracic  . GERD (gastroesophageal reflux disease)   . History of pneumonia   . Hypercholesteremia   . Polycythemia   . Seizures ([place])    Last seizure 2 yrs ago  . Smoker   . Type 2 diabetes mellitus ([place])   . Type 2 diabetes mellitus without complication [facility])     Patient Active Problem List   Diagnosis Date Noted  . Dysphagia 04/26/2018  . Respiratory failure, acute-on-chronic ([place]) 04/20/2018  . Medication  monitoring encounter 12/30/2017  . Vision disorder 12/30/2017  . Umbilical hernia 09/47/0962  . Acute on chronic diastolic heart failure ([place]) 10/24/2017  . COPD with acute exacerbation ([place])   . Acute respiratory failure with hypoxia ([place]) 10/17/2017  . Lower extremity edema 10/17/2017  . Influenza B 09/21/2017  . MAI (mycobacterium avium-intracellulare) ([place]) 08/29/2015  . Edema   . Anemia of chronic disease   . Hypokalemia 08/22/2015  . Elevated liver enzymes   . Absolute anemia   . COPD (chronic obstructive pulmonary disease) ([place]) 08/17/2015  . Sepsis ([place]) 07/26/2015  . Lobar pneumonia ([place]) 07/26/2015  . Pulmonary fibrosis ([place]) 05/10/2015  . Chronic respiratory failure with hypoxia ([place]) 05/10/2015  . Chronic atrial fibrillation   . Foot pain, bilateral   . Pulmonary nodule   . Pain, generalized 05/01/2015  . Laceration 05/01/2015  . Paroxysmal atrial fibrillation ([facility])   . Chronic respiratory failure ([place])   . Hyperglycemia 09/08/2014  . A-fib ([place]) 09/08/2014  . Diabetes ([place]) 05/29/2014  . Protein-calorie malnutrition, severe ([place]) 10/21/2013  . Hemoptysis 10/20/2013  . Atrial fibrillation with rapid ventricular response ([place]) 07/10/2013  . Shoulder pain, left 01/20/2012  . Rotator cuff syndrome of left shoulder 01/20/2012  . Spondylosis, cervical 11/19/2011  . Shoulder pain 11/19/2011  . 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  Cavitary lesion of lung 05/07/2011  . GERD with stricture   . ASCVD (arteriosclerotic cardiovascular disease)   . Type 2 diabetes mellitus without complication ([place])   . TOBACCO ABUSE 05/17/2010  . Asthma 01/20/2008  . Hyperlipidemia 12/27/2007  . 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 COLONOSCOPY  2012   Dr. Posey Pronto: normal  . ESOPHAGOGASTRODUODENOSCOPY N/A 05/04/2015   Dr. Michail Sermon: minimal erosive esophagitis, small hiatal hernia. FOOD  PRECLUDED A COMPLETE EXAM. No biopsies taken  . ESOPHAGOGASTRODUODENOSCOPY  2011   [place]: Barrett's   . ESOPHAGOGASTRODUODENOSCOPY (EGD) WITH PROPOFOL N/A 05/06/2018   Procedure: ESOPHAGOGASTRODUODENOSCOPY (EGD) WITH PROPOFOL;  Surgeon: Daneil Dolin, MD;  Location: [facility] ENDO SUITE;  Service: Endoscopy;  Laterality: N/A;  2:30pm  . LUNG BIOPSY    . MALONEY DILATION N/A 05/06/2018   Procedure: Venia Minks DILATION;  Surgeon: Daneil Dolin, MD;  Location: [facility] ENDO SUITE;  Service: Endoscopy;  Laterality: N/A;  . Throat biopsy    . VASECTOMY    . VASECTOMY  1987        Home Medications    Prior to Admission medications   Medication Sig Start Date End Date Taking? Authorizing Provider  albuterol (VENTOLIN HFA) 108 (90 BASE) MCG/ACT inhaler Inhale 2 puffs into the lungs every 6 (six) hours as needed for wheezing or shortness of breath.   Yes [provider]  azithromycin (ZITHROMAX) 500 MG tablet Take 500 mg by mouth daily.   Yes [provider]  baclofen (LIORESAL) 20 MG tablet Take 20 mg by mouth 3 (three) times daily.  06/05/15  Yes [provider]  benzonatate (TESSALON) 200 MG capsule Take 200 mg by mouth 3 (three) times daily.   Yes [provider]  budesonide (PULMICORT) 0.25 MG/2ML nebulizer solution Take 2 mLs (0.25 mg total) by nebulization 2 (two) times daily. 10/24/17  Yes [name], MD  CARTIA XT 120 MG 24 hr capsule Take 1 capsule (120 mg total) by mouth daily. 01/01/18  Yes Branch, Alphonse Guild, MD  ethambutol (MYAMBUTOL) 400 MG tablet Take 3 tablets (1,200 mg total) by mouth daily. 11/17/17  Yes Comer, Okey Regal, MD  fexofenadine (ALLEGRA) 180 MG tablet Take 180 mg by mouth daily.   Yes [provider]  fluticasone (FLONASE) 50 MCG/ACT nasal spray Place 2 sprays into both nostrils daily.  02/24/18  Yes [provider]  Fluticasone-Umeclidin-Vilant (TRELEGY ELLIPTA) 100-62.5-25 MCG/INH AEPB Inhale 1 puff into the lungs daily.    Yes [provider]  guaiFENesin (MUCUS RELIEF) 600 MG 12 hr tablet Take 600 mg by mouth 3 (three) times daily.   Yes [provider]  insulin glargine (LANTUS) 100 UNIT/ML injection Inject 0.24 mLs (24 Units total) into the skin daily. 24 units daily Patient taking differently: Inject 26 Units into the skin daily.  10/24/17  Yes [name], MD  insulin lispro (HUMALOG) 100 UNIT/ML injection Take by sliding scale provided by hospital maximum amount daily is 60 units.  Check blood sugar 4 times a day Patient taking differently: Inject 0-15 Units into the skin 3 (three) times daily with meals. Take by sliding scale provided by hospital maximum amount daily is 60 units.  Check blood sugar 4 times a day 09/23/17  Yes [name], MD  ipratropium (ATROVENT) 0.02 % nebulizer solution Take 0.5 mg by nebulization 2 (two) times daily.  09/29/17  Yes [provider]  levalbuterol (XOPENEX) 0.63 MG/3ML nebulizer solution Take 0.63 mg by nebulization 2 (two) times daily.   Yes [provider]  LORazepam (ATIVAN) 0.5 MG tablet Take 1 tablet (0.5 mg total) by mouth 2 (two) times daily as needed for anxiety or sleep. Patient taking differently: Take 0.5 mg by mouth 4 (four) times daily.  05/04/15  Yes Mikhail, [place], DO  metFORMIN (GLUCOPHAGE) 500 MG tablet Take 1 tablet (500 mg total) by mouth daily with breakfast. 09/15/14  Yes Rexene Alberts, MD  montelukast (SINGULAIR) 10 MG tablet Take 10 mg by mouth at bedtime.   Yes [provider]  omeprazole (PRILOSEC) 20 MG capsule Take 1 capsule (20 mg total) by mouth 2 (two) times daily before a meal. 04/26/18  Yes Annitta Needs, NP  oxyCODONE-acetaminophen (PERCOCET) 10-325 MG tablet Take 1 tablet by mouth every 4 (four) hours as needed for pain.   Yes [provider]  predniSONE (DELTASONE) 10 MG tablet Take 2 daily for 1 week then 1 daily Patient taking differently: Take 20 mg by mouth daily with breakfast.   10/24/17  Yes [name], MD  rifampin (RIFADIN) 300 MG capsule Take 2 capsules (600 mg total) by mouth daily. 11/17/17  Yes Comer, Okey Regal, MD  rosuvastatin (CRESTOR) 10 MG tablet Take 10 mg by mouth daily.  04/10/18  Yes [provider]  HYDROcodone-homatropine (HYCODAN) 5-1.5 MG/5ML syrup Take 5 mLs by mouth every 4 (four) hours as needed for cough. Patient not taking: Reported on 06/19/2018 04/25/18   [name], MD  nitroGLYCERIN (NITROSTAT) 0.4 MG SL tablet Place 1 tablet (0.4 mg total) under the tongue every 5 (five) minutes as needed for chest pain. 04/22/13   Lendon Colonel, NP    Family History Family History  Problem Relation Age of Onset  . Hypertension Mother   . Diabetes Mother   . Heart attack Mother   . Heart failure Mother   . Hypertension Sister   . Diabetes Sister   . Heart failure Sister   . Colon cancer Neg Hx     Social History Social History   Tobacco Use  . Smoking status: Former Smoker    Packs/day: 0.50    Years: 45.00    Pack years: 22.50    Types: Cigarettes    Last attempt to quit: 05/01/2015    Years since quitting: 3.1  . Smokeless tobacco: Never Used  Substance Use Topics  . Alcohol use: No    Alcohol/week: 0.0 standard drinks  . Drug use: No     Allergies   Albuterol; Ciprofloxacin; and Influenza vaccine live   Review of Systems Review of Systems  Constitutional: Negative for fever.  HENT: Negative for congestion.   Eyes: Negative for visual disturbance.  Respiratory: Positive for shortness of breath. Negative for cough.   Cardiovascular: Positive for chest pain. Negative for leg swelling.  Gastrointestinal: Negative for abdominal pain, nausea and vomiting.  Genitourinary: Negative for dysuria and hematuria.  Musculoskeletal: Positive for back pain.  Skin: Negative for rash.  Neurological: Negative for syncope and headaches.  Hematological: Does not bruise/bleed easily.  Psychiatric/Behavioral: Negative for  confusion.     Physical Exam Updated Vital Signs BP 132/77   Pulse 76   Temp 98.2 F (36.8 C) (Oral)   Resp 17   Ht 1.727 m (5\' 8" )   Wt 77.1 kg   SpO2 96%   BMI 25.85 kg/m   Physical Exam  Constitutional: He is oriented to person, place,  and time. He appears well-developed and well-nourished. No distress.  HENT:  Head: Normocephalic and atraumatic.  Mouth/Throat: Oropharynx is clear and moist.  Eyes: Pupils are equal, round, and reactive to light. Conjunctivae and EOM are normal.  Neck: Neck supple.  Cardiovascular: Normal rate, regular rhythm and normal heart sounds.  Pulmonary/Chest: Effort normal and breath sounds normal. No respiratory distress. He has no wheezes. He exhibits no tenderness.  Abdominal: Soft. Bowel sounds are normal. There is no tenderness.  Musculoskeletal: Normal range of motion. He exhibits no edema.  Neurological: He is alert and oriented to person, place, and time. No cranial nerve deficit or sensory deficit. He exhibits normal muscle tone. Coordination normal.  Skin: Skin is warm. No rash noted.  Nursing note and vitals reviewed.    ED Treatments / Results  Labs (all labs ordered are listed, but only abnormal results are displayed) Labs Reviewed  BASIC METABOLIC PANEL - Abnormal; Notable for the following components:      Result Value   Glucose, Bld 245 (*)    All other components within normal limits  CBC WITH DIFFERENTIAL/PLATELET - Abnormal; Notable for the following components:   Hemoglobin 12.4 (*)    All other components within normal limits  I-STAT TROPONIN, ED  POCT I-STAT TROPONIN I    EKG EKG Interpretation  Date/Time:  Saturday June 19 2018 17:08:54 EST Ventricular Rate:  92 PR Interval:  122 QRS Duration: 84 QT Interval:  364 QTC Calculation: 450 R Axis:   37 Text Interpretation:  Normal sinus rhythm Normal ECG Confirmed by Fredia Sorrow [phone]) on 06/19/2018 6:48:31 PM   Radiology Dg Chest 2 View  Result  Date: 06/19/2018 CLINICAL DATA:  Chest pain today. EXAM: CHEST - 2 VIEW COMPARISON:  04/19/2018 FINDINGS: Heart size is normal.  Aortic atherosclerosis. Moderate to severe emphysema again demonstrated. Scarring is again seen predominately in the left lung, which is unchanged. No evidence of pulmonary infiltrate or edema. No evidence of pneumothorax or pleural effusion. IMPRESSION: Stable emphysema and left lung scarring.  No active disease. Electronically Signed   By: Earle Gell M.D.   On: 06/19/2018 17:48   Ct Angio Chest Pe W/cm &/or Wo Cm  Result Date: 06/19/2018 CLINICAL DATA:  Chest pain EXAM: CT ANGIOGRAPHY CHEST WITH CONTRAST TECHNIQUE: Multidetector CT imaging of the chest was performed using the standard protocol during bolus administration of intravenous contrast. Multiplanar CT image reconstructions and MIPs were obtained to evaluate the vascular anatomy. CONTRAST:  36mL ISOVUE-370 IOPAMIDOL (ISOVUE-370) INJECTION 76% COMPARISON:  Chest radiographs dated 06/19/2018. CT chest dated 09/01/2017. FINDINGS: Cardiovascular: Satisfactory opacification of the bilateral pulmonary arteries to the lobar level. No evidence of pulmonary embolism. No evidence of thoracic aortic aneurysm or dissection. Mild atherosclerotic calcifications of the aortic arch. 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 Mediastinum/Nodes: No suspicious mediastinal lymphadenopathy. Visualized thyroid is unremarkable. Lungs/Pleura: Tracheobronchomalacia. 1.7 x 4.6 cm cavitary left upper lobe lesion (series 7/image 35), previously 2.7 x 5.2 cm, now with a relatively thin wall. Mild wall thickening persists inferiorly (series 7/image 48), but overall the superimposed infection/inflammation has improved. 2.2 x 6.0 cm thick-walled cavitary lesion in the posterior left lower lobe (series 7/image 90), previously 2.7 x 6.0 cm when measured in a similar fashion on the prior study, with persistent but decreased solid component  laterally (series 7/image 82). However, this lesion is progressive when compared to prior CTs from 2017-18. Extensive centrilobular and paraseptal emphysematous changes. Mild scarring/atelectasis in the medial right middle  lobe. No focal consolidation. No new/suspicious pulmonary nodules. No pleural effusion or pneumothorax. Upper Abdomen: Visualized upper abdomen is notable for mild hepatic steatosis. Musculoskeletal: Visualized osseous structures are within normal limits. Review of the MIP images confirms the above findings. IMPRESSION: No evidence of pulmonary embolism. Cavitary lesions in the left upper and lower lobes are stable versus mildly decreased, with decreased superimposed infection/inflammation when compared to recent priors. However, the left lower lobe lesion is progressive when compared to 2017-18. As such, attention on follow-up is suggested. Tracheobronchomalacia. Aortic Atherosclerosis (ICD10-I70.0) and Emphysema (ICD10-J43.9). Electronically Signed   By: Julian Hy M.D.   On: 06/19/2018 20:36    Procedures Procedures (including critical care time)  Medications Ordered in ED Medications  nitroGLYCERIN (NITROSTAT) SL tablet 0.4 mg (has no administration in time range)  0.9 %  sodium chloride infusion ( Intravenous New Bag/Given 06/19/18 1911)  ipratropium-albuterol (DUONEB) 0.5-2.5 (3) MG/3ML nebulizer solution 3 mL (3 mLs Nebulization Not Given 06/19/18 1937)  aspirin chewable tablet 324 mg (324 mg Oral Given 06/19/18 1911)  ipratropium (ATROVENT) 0.02 % nebulizer solution (0.5 mg  Given 06/19/18 1936)  levalbuterol (XOPENEX) 1.25 MG/0.5ML nebulizer solution (1.25 mg  Given 06/19/18 1936)  iopamidol (ISOVUE-370) 76 % injection 100 mL (75 mLs Intravenous Contrast Given 06/19/18 2017)     Initial Impression / Assessment and Plan / ED Course  I have reviewed the triage vital signs and the nursing notes.  Pertinent labs & imaging results that were available during my care of the  patient were reviewed by me and considered in my medical decision making (see chart for details).    Patient with significant coronary artery disease risk factors.  Has had pain since about 10 or 1030 this morning is been constant initial troponin was negative.  However patient still has pain.  Patient received sublingual nitroglycerin and morphine dropped the pain from a 10 out of 10 down to a 6 out of 10.  Patient also given aspirin.  EKG without any acute findings.  Chest x-ray without any significant abnormalities due to the pleuritic nature of the chest pain patient had CT NGO which did not show pulmonary embolism.  Did show his pre-existing cavitary lesions.  Patient will be admitted for chest pain rule out by hospitalist service.   Final Clinical Impressions(s) / ED Diagnoses   Final diagnoses:  Precordial pain    ED Discharge Orders    None       Fredia Sorrow, MD 06/20/18 0008

## 2018-06-19 NOTE — H&P (Signed)
History and Physical  Ryan [name] WJX:[id number] DOB: 11-20-1960 DOA: 06/19/2018  Referring physician: Dr Rogene Houston, ED physician PCP: [name], MD  Outpatient Specialists:   Patient Coming From: home  Chief Complaint: chest pain  HPI: Ryan [name] is a 56 y.o. male with a history of atrial fibrillation not on anticoagulation, GERD with Barrett's esophagus, diabetes, COPD with cavitary lesions of lung, coronary atherosclerosis, GERD, history of seizure disorder, smoker.  Patient seen for left-sided chest pain that started approximately 12 hours ago.  Pain is constant and in left anterior chest.  Reported as a severe ache that is worse with deep inspiration and with coughing.  No palliating factors.  Pain is nonradiating.  It is not improved with oxycodone or Prilosec. No changes with eating or ambulation/exertion.  As it was not getting better, the patient came to the hospital for evaluation.    Emergency Department Course: First troponin negative.  CTA shows stable cavitary lesions on the right side with a increasing lesion on the left.  Review of Systems:   Pt denies any fevers, chills, nausea, vomiting, diarrhea, constipation, abdominal pain, dyspnea on exertion, orthopnea, cough, wheezing, palpitations, headache, vision changes, lightheadedness, dizziness, melena, rectal bleeding.  Review of systems are otherwise negative  Past Medical History:  Diagnosis Date  . Atrial fibrillation ([place])    Not anticoagulated  . Barrett's esophagus   . Borderline diabetes   . Bronchitis   . CAP (community acquired pneumonia) 07/10/2013   04/2015  . 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 Chronic back pain   . Chronic left shoulder pain   . Chronic neck pain   . Chronic respiratory failure ([place])   . Collagen vascular disease ([place])   . COPD (chronic obstructive pulmonary disease) ([place])   . Coronary atherosclerosis of native coronary artery    Mild  nonobstructive disease at catheterization 2007  . DDD (degenerative disc disease)    Cervical and thoracic  . GERD (gastroesophageal reflux disease)   . History of pneumonia   . Hypercholesteremia   . Polycythemia   . Seizures ([place])    Last seizure 2 yrs ago  . Smoker   . Type 2 diabetes mellitus ([place])   . Type 2 diabetes mellitus without complication [facility])    Past Surgical History:  Procedure Laterality Date  . COLONOSCOPY  2012   Dr. Posey Pronto: normal  . ESOPHAGOGASTRODUODENOSCOPY N/A 05/04/2015   Dr. Michail Sermon: minimal erosive esophagitis, small hiatal hernia. FOOD PRECLUDED A COMPLETE EXAM. No biopsies taken  . ESOPHAGOGASTRODUODENOSCOPY  2011   [place]: Barrett's   . ESOPHAGOGASTRODUODENOSCOPY (EGD) WITH PROPOFOL N/A 05/06/2018   Procedure: ESOPHAGOGASTRODUODENOSCOPY (EGD) WITH PROPOFOL;  Surgeon: Daneil Dolin, MD;  Location: [facility];  Service: Endoscopy;  Laterality: N/A;  2:30pm  . LUNG BIOPSY    . MALONEY DILATION N/A 05/06/2018   Procedure: Venia Minks DILATION;  Surgeon: Daneil Dolin, MD;  Location: [facility];  Service: Endoscopy;  Laterality: N/A;  . Throat biopsy    . VASECTOMY    . VASECTOMY  1987   Social History:  reports that he quit smoking about 3 years ago. His smoking use included cigarettes. He has a 22.50 pack-year smoking history. He has never used smokeless tobacco. He reports that he does not drink alcohol or use drugs. Patient lives at home  Allergies  Allergen Reactions  . Albuterol Palpitations  . Ciprofloxacin Other (See Comments)    Patient  experiences vision loss from long-term and short-term use  . Influenza Vaccine Live Swelling    Family History  Problem Relation Age of Onset  . Hypertension Mother   . Diabetes Mother   . Heart attack Mother   . Heart failure Mother   . Hypertension Sister   . Diabetes Sister   . Heart failure Sister   . Colon cancer Neg Hx       Prior to Admission medications   Medication Sig Start Date  End Date Taking? Authorizing Provider  albuterol (VENTOLIN HFA) 108 (90 BASE) MCG/ACT inhaler Inhale 2 puffs into the lungs every 6 (six) hours as needed for wheezing or shortness of breath.   Yes [provider]  azithromycin (ZITHROMAX) 500 MG tablet Take 500 mg by mouth daily.   Yes [provider]  baclofen (LIORESAL) 20 MG tablet Take 20 mg by mouth 3 (three) times daily.  06/05/15  Yes [provider]  benzonatate (TESSALON) 200 MG capsule Take 200 mg by mouth 3 (three) times daily.   Yes [provider]  budesonide (PULMICORT) 0.25 MG/2ML nebulizer solution Take 2 mLs (0.25 mg total) by nebulization 2 (two) times daily. 10/24/17  Yes [name], MD  CARTIA XT 120 MG 24 hr capsule Take 1 capsule (120 mg total) by mouth daily. 01/01/18  Yes Branch, Alphonse Guild, MD  ethambutol (MYAMBUTOL) 400 MG tablet Take 3 tablets (1,200 mg total) by mouth daily. 11/17/17  Yes Comer, Okey Regal, MD  fexofenadine (ALLEGRA) 180 MG tablet Take 180 mg by mouth daily.   Yes [provider]  fluticasone (FLONASE) 50 MCG/ACT nasal spray Place 2 sprays into both nostrils daily.  02/24/18  Yes [provider]  Fluticasone-Umeclidin-Vilant (TRELEGY ELLIPTA) 100-62.5-25 MCG/INH AEPB Inhale 1 puff into the lungs daily.   Yes [provider]  guaiFENesin (MUCUS RELIEF) 600 MG 12 hr tablet Take 600 mg by mouth 3 (three) times daily.   Yes [provider]  insulin glargine (LANTUS) 100 UNIT/ML injection Inject 0.24 mLs (24 Units total) into the skin daily. 24 units daily Patient taking differently: Inject 26 Units into the skin daily.  10/24/17  Yes [name], MD  insulin lispro (HUMALOG) 100 UNIT/ML injection Take by sliding scale provided by hospital maximum amount daily is 60 units.  Check blood sugar 4 times a day Patient taking differently: Inject 0-15 Units into the skin 3 (three) times daily with meals. Take by sliding scale provided by hospital  maximum amount daily is 60 units.  Check blood sugar 4 times a day 09/23/17  Yes [name], MD  ipratropium (ATROVENT) 0.02 % nebulizer solution Take 0.5 mg by nebulization 2 (two) times daily.  09/29/17  Yes [provider]  levalbuterol Penne Lash) 0.63 MG/3ML nebulizer solution Take 0.63 mg by nebulization 2 (two) times daily.   Yes [provider]  LORazepam (ATIVAN) 0.5 MG tablet Take 1 tablet (0.5 mg total) by mouth 2 (two) times daily as needed for anxiety or sleep. Patient taking differently: Take 0.5 mg by mouth 4 (four) times daily.  05/04/15  Yes Mikhail, [place], DO  metFORMIN (GLUCOPHAGE) 500 MG tablet Take 1 tablet (500 mg total) by mouth daily with breakfast. 09/15/14  Yes Rexene Alberts, MD  montelukast (SINGULAIR) 10 MG tablet Take 10 mg by mouth at bedtime.   Yes [provider]  omeprazole (PRILOSEC) 20 MG capsule Take 1 capsule (20 mg total) by mouth 2 (two) times daily before a meal. 04/26/18  Yes Annitta Needs, NP  oxyCODONE-acetaminophen (PERCOCET) 10-325 MG tablet Take 1 tablet by mouth every 4 (four) hours as needed for pain.   Yes [provider]  predniSONE (DELTASONE) 10 MG tablet Take 2 daily for 1 week then 1 daily Patient taking differently: Take 20 mg by mouth daily with breakfast.  10/24/17  Yes [name], MD  rifampin (RIFADIN) 300 MG capsule Take 2 capsules (600 mg total) by mouth daily. 11/17/17  Yes Comer, Okey Regal, MD  rosuvastatin (CRESTOR) 10 MG tablet Take 10 mg by mouth daily.  04/10/18  Yes [provider]  HYDROcodone-homatropine (HYCODAN) 5-1.5 MG/5ML syrup Take 5 mLs by mouth every 4 (four) hours as needed for cough. Patient not taking: Reported on 06/19/2018 04/25/18   [name], MD  nitroGLYCERIN (NITROSTAT) 0.4 MG SL tablet Place 1 tablet (0.4 mg total) under the tongue every 5 (five) minutes as needed for chest pain. 04/22/13   Lendon Colonel, NP    Physical Exam: BP 119/78   Pulse 87    Temp 98.2 F (36.8 C) (Oral)   Resp 17   Ht 5\' 8"  (1.727 m)   Wt 77.1 kg   SpO2 94%   BMI 25.85 kg/m   . General: Middle-aged Caucasian male. Awake and alert and oriented x3. No acute cardiopulmonary distress.  [name] HEENT: Normocephalic atraumatic.  Right and left ears normal in appearance.  Pupils equal, round, reactive to light. Extraocular muscles are intact. Sclerae anicteric and noninjected.  Moist mucosal membranes. No mucosal lesions.  . Neck: Neck supple without lymphadenopathy. No carotid bruits. No masses palpated.  . Cardiovascular: Regular rate with normal S1-S2 sounds. No murmurs, rubs, gallops auscultated. No JVD.  [name] Respiratory: Good respiratory effort with no wheezes, rales, rhonchi. Lungs clear to auscultation bilaterally.  No accessory muscle use. . Abdomen: Soft, nontender, nondistended. Active bowel sounds. No masses or hepatosplenomegaly  . Skin: No rashes, lesions, or ulcerations.  Dry, warm to touch. 2+ dorsalis pedis and radial pulses. . Musculoskeletal: Pain reproducible with palpation.  No calf or leg pain. All major joints not erythematous nontender.  No upper or lower joint deformation.  Good ROM.  No contractures  . Psychiatric: Intact judgment and insight. Pleasant and cooperative. . Neurologic: No focal neurological deficits. Strength is 5/5 and symmetric in upper and lower extremities.  Cranial nerves II through XII are grossly intact.           Labs on Admission: I have personally reviewed following labs and imaging studies  CBC: Recent Labs  Lab 06/19/18 1825  WBC 5.9  NEUTROABS 3.7  HGB 12.4*  HCT 39.4  MCV 86.4  PLT 299   Basic Metabolic Panel: Recent Labs  Lab 06/19/18 1825  NA 135  K 4.5  CL 99  CO2 27  GLUCOSE 245*  BUN 11  CREATININE 0.94  CALCIUM 8.9   GFR: Estimated Creatinine Clearance: 81.9 mL/min (by C-G formula based on SCr of 0.94 mg/dL). Liver Function Tests: No results for input(s): AST, ALT, ALKPHOS, BILITOT, PROT,  ALBUMIN in the last 168 hours. No results for input(s): LIPASE, AMYLASE in the last 168 hours. No results for input(s): AMMONIA in the last 168 hours. Coagulation Profile: No results for input(s): INR, PROTIME in the last 168 hours. Cardiac Enzymes: No results for input(s): CKTOTAL, CKMB, CKMBINDEX, TROPONINI in the last 168 hours. BNP (last 3 results) No results for input(s): PROBNP in the last 8760 hours. HbA1C: No results for input(s): HGBA1C in  the last 72 hours. CBG: No results for input(s): GLUCAP in the last 168 hours. Lipid Profile: No results for input(s): CHOL, HDL, LDLCALC, TRIG, CHOLHDL, LDLDIRECT in the last 72 hours. Thyroid Function Tests: No results for input(s): TSH, T4TOTAL, FREET4, T3FREE, THYROIDAB in the last 72 hours. Anemia Panel: No results for input(s): VITAMINB12, FOLATE, FERRITIN, TIBC, IRON, RETICCTPCT in the last 72 hours. Urine analysis:    Component Value Date/Time   COLORURINE YELLOW 10/17/2017 2205   APPEARANCEUR CLEAR 10/17/2017 2205   LABSPEC 1.010 10/17/2017 2205   PHURINE 5.0 10/17/2017 2205   GLUCOSEU NEGATIVE 10/17/2017 2205   HGBUR SMALL (A) 10/17/2017 2205   BILIRUBINUR NEGATIVE 10/17/2017 2205   KETONESUR NEGATIVE 10/17/2017 2205   PROTEINUR NEGATIVE 10/17/2017 2205   UROBILINOGEN 0.2 09/08/2014 1444   NITRITE NEGATIVE 10/17/2017 2205   LEUKOCYTESUR NEGATIVE 10/17/2017 2205   Sepsis Labs: @LABRCNTIP (procalcitonin:4,lacticidven:4) )No results found for this or any previous visit (from the past 240 hour(s)).   Radiological Exams on Admission: Dg Chest 2 View  Result Date: 06/19/2018 CLINICAL DATA:  Chest pain today. EXAM: CHEST - 2 VIEW COMPARISON:  04/19/2018 FINDINGS: Heart size is normal.  Aortic atherosclerosis. Moderate to severe emphysema again demonstrated. Scarring is again seen predominately in the left lung, which is unchanged. No evidence of pulmonary infiltrate or edema. No evidence of pneumothorax or pleural effusion.  IMPRESSION: Stable emphysema and left lung scarring.  No active disease. Electronically Signed   By: Earle Gell M.D.   On: 06/19/2018 17:48   Ct Angio Chest Pe W/cm &/or Wo Cm  Result Date: 06/19/2018 CLINICAL DATA:  Chest pain EXAM: CT ANGIOGRAPHY CHEST WITH CONTRAST TECHNIQUE: Multidetector CT imaging of the chest was performed using the standard protocol during bolus administration of intravenous contrast. Multiplanar CT image reconstructions and MIPs were obtained to evaluate the vascular anatomy. CONTRAST:  71mL ISOVUE-370 IOPAMIDOL (ISOVUE-370) INJECTION 76% COMPARISON:  Chest radiographs dated 06/19/2018. CT chest dated 09/01/2017. FINDINGS: Cardiovascular: Satisfactory opacification of the bilateral pulmonary arteries to the lobar level. No evidence of pulmonary embolism. No evidence of thoracic aortic aneurysm or dissection. Mild atherosclerotic calcifications of the aortic arch. 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 Mediastinum/Nodes: No suspicious mediastinal lymphadenopathy. Visualized thyroid is unremarkable. Lungs/Pleura: Tracheobronchomalacia. 1.7 x 4.6 cm cavitary left upper lobe lesion (series 7/image 35), previously 2.7 x 5.2 cm, now with a relatively thin wall. Mild wall thickening persists inferiorly (series 7/image 48), but overall the superimposed infection/inflammation has improved. 2.2 x 6.0 cm thick-walled cavitary lesion in the posterior left lower lobe (series 7/image 90), previously 2.7 x 6.0 cm when measured in a similar fashion on the prior study, with persistent but decreased solid component laterally (series 7/image 82). However, this lesion is progressive when compared to prior CTs from 2017-18. Extensive centrilobular and paraseptal emphysematous changes. Mild scarring/atelectasis in the medial right middle lobe. No focal consolidation. No new/suspicious pulmonary nodules. No pleural effusion or pneumothorax. Upper Abdomen: Visualized upper abdomen is  notable for mild hepatic steatosis. Musculoskeletal: Visualized osseous structures are within normal limits. Review of the MIP images confirms the above findings. IMPRESSION: No evidence of pulmonary embolism. Cavitary lesions in the left upper and lower lobes are stable versus mildly decreased, with decreased superimposed infection/inflammation when compared to recent priors. However, the left lower lobe lesion is progressive when compared to 2017-18. As such, attention on follow-up is suggested. Tracheobronchomalacia. Aortic Atherosclerosis (ICD10-I70.0) and Emphysema (ICD10-J43.9). Electronically Signed   By: Henderson Newcomer.D.  On: 06/19/2018 20:36    EKG: Independently reviewed.  Sinus rhythm with normal ST segments.  Assessment/Plan: Principal Problem:   Left-sided chest pain Active Problems:   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  quit smoking 05/01/15    GERD with stricture   ASCVD (arteriosclerotic cardiovascular disease)   Type 2 diabetes mellitus without complication (HCC)   Atrial fibrillation with rapid ventricular response (HCC)   Pulmonary fibrosis (HCC)   Chronic respiratory failure with hypoxia [facility]    This patient was discussed with the ED physician, including pertinent vitals, physical exam findings, labs, and imaging.  We also discussed care given by the ED provider.  1. Left-sided chest pain a. Chest pain not resolved b. Although unlikely to be cardiac in nature, the patient has many risk factors including diabetes, coronary artery disease, tobacco abuse.  Will observe overnight and rule out with serial troponins c. Telemetry monitoring d. As patient not chest pain-free, will place in stepdown e. Toradol IV f. Morphine as needed 2. Pulmonary fibrosis a.  3. COPD a. Continue outpatient regimen: Long-acting bronchodilator with inhaled corticosteroid b. Patient on long-term azithromycin and rifampin for MAI 4. Cavitary lesions of lung 5. Chronic respiratory  failure a. Continue supplemental oxygen 6. Paroxysmal atrial fibrillation a. Currently in sinus rhythm 7. Coronary artery atherosclerosis a. Continue Crestor 8. Type 2 diabetes a. Hold metformin due to CTA b. Continue long-acting insulin with sliding scale insulin and CBGs before meals and nightly 9. Chronic pain a. Continue outpatient regimen  DVT prophylaxis: Lovenox Consultants: None Code Status: Full code Family Communication: None Disposition Plan: Patient to return home following rule out   Truett Mainland, DO [facility] Pager [phone]  If 7PM-7AM, please contact night-coverage [email or web address] Password [email or web address]

## 2018-06-19 NOTE — ED Triage Notes (Signed)
Chest pain onset this am. 

## 2018-06-20 DIAGNOSIS — Z8249 Family history of ischemic heart disease and other diseases of the circulatory system: Secondary | ICD-10-CM | POA: Diagnosis not present

## 2018-06-20 DIAGNOSIS — J441 Chronic obstructive pulmonary disease with (acute) exacerbation: Secondary | ICD-10-CM | POA: Diagnosis not present

## 2018-06-20 DIAGNOSIS — E7800 Pure hypercholesterolemia, unspecified: Secondary | ICD-10-CM | POA: Diagnosis present

## 2018-06-20 DIAGNOSIS — Z7951 Long term (current) use of inhaled steroids: Secondary | ICD-10-CM | POA: Diagnosis not present

## 2018-06-20 DIAGNOSIS — Z833 Family history of diabetes mellitus: Secondary | ICD-10-CM | POA: Diagnosis not present

## 2018-06-20 DIAGNOSIS — M545 Low back pain: Secondary | ICD-10-CM | POA: Diagnosis present

## 2018-06-20 DIAGNOSIS — Z888 Allergy status to other drugs, medicaments and biological substances status: Secondary | ICD-10-CM | POA: Diagnosis not present

## 2018-06-20 DIAGNOSIS — J9621 Acute and chronic respiratory failure with hypoxia: Secondary | ICD-10-CM | POA: Diagnosis not present

## 2018-06-20 DIAGNOSIS — R918 Other nonspecific abnormal finding of lung field: Secondary | ICD-10-CM | POA: Diagnosis present

## 2018-06-20 DIAGNOSIS — Z881 Allergy status to other antibiotic agents status: Secondary | ICD-10-CM | POA: Diagnosis not present

## 2018-06-20 DIAGNOSIS — I5043 Acute on chronic combined systolic (congestive) and diastolic (congestive) heart failure: Secondary | ICD-10-CM | POA: Diagnosis present

## 2018-06-20 DIAGNOSIS — F419 Anxiety disorder, unspecified: Secondary | ICD-10-CM | POA: Diagnosis present

## 2018-06-20 DIAGNOSIS — A310 Pulmonary mycobacterial infection: Secondary | ICD-10-CM | POA: Diagnosis present

## 2018-06-20 DIAGNOSIS — I480 Paroxysmal atrial fibrillation: Secondary | ICD-10-CM | POA: Diagnosis present

## 2018-06-20 DIAGNOSIS — Z8701 Personal history of pneumonia (recurrent): Secondary | ICD-10-CM | POA: Diagnosis not present

## 2018-06-20 DIAGNOSIS — K219 Gastro-esophageal reflux disease without esophagitis: Secondary | ICD-10-CM | POA: Diagnosis present

## 2018-06-20 DIAGNOSIS — I2510 Atherosclerotic heart disease of native coronary artery without angina pectoris: Secondary | ICD-10-CM | POA: Diagnosis present

## 2018-06-20 DIAGNOSIS — Z87891 Personal history of nicotine dependence: Secondary | ICD-10-CM | POA: Diagnosis not present

## 2018-06-20 DIAGNOSIS — R079 Chest pain, unspecified: Secondary | ICD-10-CM | POA: Diagnosis not present

## 2018-06-20 DIAGNOSIS — K2270 Barrett's esophagus without dysplasia: Secondary | ICD-10-CM | POA: Diagnosis present

## 2018-06-20 DIAGNOSIS — J8410 Pulmonary fibrosis, unspecified: Secondary | ICD-10-CM | POA: Diagnosis present

## 2018-06-20 DIAGNOSIS — Z79899 Other long term (current) drug therapy: Secondary | ICD-10-CM | POA: Diagnosis not present

## 2018-06-20 DIAGNOSIS — J449 Chronic obstructive pulmonary disease, unspecified: Secondary | ICD-10-CM | POA: Diagnosis not present

## 2018-06-20 DIAGNOSIS — Z887 Allergy status to serum and vaccine status: Secondary | ICD-10-CM | POA: Diagnosis not present

## 2018-06-20 DIAGNOSIS — G8929 Other chronic pain: Secondary | ICD-10-CM | POA: Diagnosis present

## 2018-06-20 DIAGNOSIS — E119 Type 2 diabetes mellitus without complications: Secondary | ICD-10-CM | POA: Diagnosis present

## 2018-06-20 LAB — GLUCOSE, CAPILLARY
GLUCOSE-CAPILLARY: 184 mg/dL — AB (ref 70–99)
Glucose-Capillary: 404 mg/dL — ABNORMAL HIGH (ref 70–99)
Glucose-Capillary: 231 mg/dL — ABNORMAL HIGH (ref 70–99)
Glucose-Capillary: 167 mg/dL — ABNORMAL HIGH (ref 70–99)

## 2018-06-20 LAB — MRSA PCR SCREENING: MRSA by PCR: NEGATIVE

## 2018-06-20 LAB — TROPONIN I
Troponin I: <0.03 ng/mL (ref <0.03)
Troponin I: <0.03 ng/mL (ref <0.03)

## 2018-06-20 MED ORDER — BUDESONIDE 0.5 MG/2ML IN SUSP
0.5000 mg | Freq: Two times a day (BID) | RESPIRATORY_TRACT | Status: DC
  Administered 2018-06-20 – 2018-06-28 (×16): 0.5 mg via RESPIRATORY_TRACT
  Filled 2018-06-20 (×16): qty 2

## 2018-06-20 MED ORDER — ORAL CARE MOUTH RINSE
15.0000 mL | Freq: Two times a day (BID) | OROMUCOSAL | Status: DC
  Administered 2018-06-20 – 2018-06-28 (×14): 15 mL via OROMUCOSAL

## 2018-06-20 MED ORDER — UMECLIDINIUM-VILANTEROL 62.5-25 MCG/INH IN AEPB
1.0000 | INHALATION_SPRAY | Freq: Every day | RESPIRATORY_TRACT | Status: DC
  Administered 2018-06-20 – 2018-06-28 (×9): 1 via RESPIRATORY_TRACT
  Filled 2018-06-20 (×2): qty 14

## 2018-06-20 NOTE — Progress Notes (Signed)
Subjective: He came into the hospital with chest pain.  The pain is on the left side of his chest but seems to be more pleuritic although he does say it is constant.  It is worse when he takes a deep breath or coughs.  He has ruled out for acute MI.  He says his chest pain started after he had a severe episode of reflux  Objective: Vital signs in last 24 hours: Temp:  [97.1 F (36.2 C)-98.4 F (36.9 C)] 97.1 F (36.2 C) (12/08 0800) Pulse Rate:  [62-89] 71 (12/08 0800) Resp:  [16-24] 16 (12/08 0800) BP: (103-138)/(63-83) 125/81 (12/08 0800) SpO2:  [92 %-100 %] 100 % (12/08 0918) Weight:  [77.1 kg-77.8 kg] 77.8 kg (12/08 0622) Weight change:  Last BM Date: 06/19/18  Intake/Output from previous day: No intake/output data recorded.  PHYSICAL EXAM General appearance: alert, cooperative and mild distress Resp: rhonchi bilaterally Cardio: regular rate and rhythm, S1, S2 normal, no murmur, click, rub or gallop GI: soft, non-tender; bowel sounds normal; no masses,  no organomegaly Extremities: extremities normal, atraumatic, no cyanosis or edema  Lab Results:  Results for orders placed or performed during the hospital encounter of 06/19/18 (from the past 48 hour(s))  Basic metabolic panel     Status: Abnormal   Collection Time: 06/19/18  6:25 PM  Result Value Ref Range   Sodium 135 135 - 145 mmol/L   Potassium 4.5 3.5 - 5.1 mmol/L   Chloride 99 98 - 111 mmol/L   CO2 27 22 - 32 mmol/L   Glucose, Bld 245 (H) 70 - 99 mg/dL   BUN 11 6 - 20 mg/dL   Creatinine, Ser 0.94 0.61 - 1.24 mg/dL   Calcium 8.9 8.9 - 10.3 mg/dL   GFR calc non Af Amer >60 >60 mL/min   GFR calc Af Amer >60 >60 mL/min   Anion gap 9 5 - 15    Comment: Performed at [facility], [address], [place], [place] [address]  CBC with Differential     Status: Abnormal   Collection Time: 06/19/18  6:25 PM  Result Value Ref Range   WBC 5.9 4.0 - 10.5 K/uL   RBC 4.56 4.22 - 5.81 MIL/uL   Hemoglobin 12.4 (L) 13.0 - 17.0  g/dL   HCT 39.4 39.0 - 52.0 %   MCV 86.4 80.0 - 100.0 fL   MCH 27.2 26.0 - 34.0 pg   MCHC 31.5 30.0 - 36.0 g/dL   RDW 13.4 11.5 - 15.5 %   Platelets 164 150 - 400 K/uL   nRBC 0.0 0.0 - 0.2 %   Neutrophils Relative % 61 %   Neutro Abs 3.7 1.7 - 7.7 K/uL   Lymphocytes Relative 22 %   Lymphs Abs 1.3 0.7 - 4.0 K/uL   Monocytes Relative 11 %   Monocytes Absolute 0.6 0.1 - 1.0 K/uL   Eosinophils Relative 4 %   Eosinophils Absolute 0.2 0.0 - 0.5 K/uL   Basophils Relative 1 %   Basophils Absolute 0.0 0.0 - 0.1 K/uL   Immature Granulocytes 1 %   Abs Immature Granulocytes 0.03 0.00 - 0.07 K/uL    Comment: Performed at [facility], [address], [place], [place] [address]  POCT i-Stat troponin I     Status: None   Collection Time: 06/19/18  6:43 PM  Result Value Ref Range   Troponin i, poc 0.00 0.00 - 0.08 ng/mL   Comment 3  Comment: Due to the release kinetics of cTnI, a negative result within the first hours of the onset of symptoms does not rule out myocardial infarction with certainty. If myocardial infarction is still suspected, repeat the test at appropriate intervals.   Troponin I - Now Then Q6H     Status: None   Collection Time: 06/19/18 10:05 PM  Result Value Ref Range   Troponin I <0.03 <0.03 ng/mL    Comment: Performed at [facility], [address], [place], [place] [address]  MRSA PCR Screening     Status: None   Collection Time: 06/19/18 10:06 PM  Result Value Ref Range   MRSA by PCR NEGATIVE NEGATIVE    Comment:        The GeneXpert MRSA Assay (FDA approved for NASAL specimens only), is one component of a comprehensive MRSA colonization surveillance program. It is not intended to diagnose MRSA infection nor to guide or monitor treatment for MRSA infections. Performed at [facility], [address] [place], [place] [address]   Glucose, capillary     Status: Abnormal   Collection Time: 06/19/18 10:23 PM  Result Value Ref Range    Glucose-Capillary 208 (H) 70 - 99 mg/dL  Troponin I - Now Then Q6H     Status: None   Collection Time: 06/20/18  4:00 AM  Result Value Ref Range   Troponin I <0.03 <0.03 ng/mL    Comment: Performed at [facility], [address] [place], [place] [address]  Glucose, capillary     Status: Abnormal   Collection Time: 06/20/18  8:47 AM  Result Value Ref Range   Glucose-Capillary 184 (H) 70 - 99 mg/dL    ABGS No results for input(s): PHART, PO2ART, TCO2, HCO3 in the last 72 hours.  Invalid input(s): PCO2 CULTURES Recent Results (from the past 240 hour(s))  MRSA PCR Screening     Status: None   Collection Time: 06/19/18 10:06 PM  Result Value Ref Range Status   MRSA by PCR NEGATIVE NEGATIVE Final    Comment:        The GeneXpert MRSA Assay (FDA approved for NASAL specimens only), is one component of a comprehensive MRSA colonization surveillance program. It is not intended to diagnose MRSA infection nor to guide or monitor treatment for MRSA infections. Performed at [facility], [address], [place], [place] [address]    Studies/Results: Dg Chest 2 View  Result Date: 06/19/2018 CLINICAL DATA:  Chest pain today. EXAM: CHEST - 2 VIEW COMPARISON:  04/19/2018 FINDINGS: Heart size is normal.  Aortic atherosclerosis. Moderate to severe emphysema again demonstrated. Scarring is again seen predominately in the left lung, which is unchanged. No evidence of pulmonary infiltrate or edema. No evidence of pneumothorax or pleural effusion. IMPRESSION: Stable emphysema and left lung scarring.  No active disease. Electronically Signed   By: Earle Gell M.D.   On: 06/19/2018 17:48   Ct Angio Chest Pe W/cm &/or Wo Cm  Result Date: 06/19/2018 CLINICAL DATA:  Chest pain EXAM: CT ANGIOGRAPHY CHEST WITH CONTRAST TECHNIQUE: Multidetector CT imaging of the chest was performed using the standard protocol during bolus administration of intravenous contrast. Multiplanar CT image reconstructions and MIPs  were obtained to evaluate the vascular anatomy. CONTRAST:  75mL ISOVUE-370 IOPAMIDOL (ISOVUE-370) INJECTION 76% COMPARISON:  Chest radiographs dated 06/19/2018. CT chest dated 09/01/2017. FINDINGS: Cardiovascular: Satisfactory opacification of the bilateral pulmonary arteries to the lobar level. No evidence of pulmonary embolism. No evidence of thoracic aortic aneurysm or dissection. Mild  atherosclerotic calcifications of the aortic arch. 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 Mediastinum/Nodes: No suspicious mediastinal lymphadenopathy. Visualized thyroid is unremarkable. Lungs/Pleura: Tracheobronchomalacia. 1.7 x 4.6 cm cavitary left upper lobe lesion (series 7/image 35), previously 2.7 x 5.2 cm, now with a relatively thin wall. Mild wall thickening persists inferiorly (series 7/image 48), but overall the superimposed infection/inflammation has improved. 2.2 x 6.0 cm thick-walled cavitary lesion in the posterior left lower lobe (series 7/image 90), previously 2.7 x 6.0 cm when measured in a similar fashion on the prior study, with persistent but decreased solid component laterally (series 7/image 82). However, this lesion is progressive when compared to prior CTs from 2017-18. Extensive centrilobular and paraseptal emphysematous changes. Mild scarring/atelectasis in the medial right middle lobe. No focal consolidation. No new/suspicious pulmonary nodules. No pleural effusion or pneumothorax. Upper Abdomen: Visualized upper abdomen is notable for mild hepatic steatosis. Musculoskeletal: Visualized osseous structures are within normal limits. Review of the MIP images confirms the above findings. IMPRESSION: No evidence of pulmonary embolism. Cavitary lesions in the left upper and lower lobes are stable versus mildly decreased, with decreased superimposed infection/inflammation when compared to recent priors. However, the left lower lobe lesion is progressive when compared to 2017-18. As such,  attention on follow-up is suggested. Tracheobronchomalacia. Aortic Atherosclerosis (ICD10-I70.0) and Emphysema (ICD10-J43.9). Electronically Signed   By: Julian Hy M.D.   On: 06/19/2018 20:36    Medications:  Prior to Admission:  Medications Prior to Admission  Medication Sig Dispense Refill Last Dose  . albuterol (VENTOLIN HFA) 108 (90 BASE) MCG/ACT inhaler Inhale 2 puffs into the lungs every 6 (six) hours as needed for wheezing or shortness of breath.   06/19/2018 at Unknown time  . azithromycin (ZITHROMAX) 500 MG tablet Take 500 mg by mouth daily.   06/19/2018 at Unknown time  . baclofen (LIORESAL) 20 MG tablet Take 20 mg by mouth 3 (three) times daily.    06/19/2018 at Unknown time  . benzonatate (TESSALON) 200 MG capsule Take 200 mg by mouth 3 (three) times daily.   06/19/2018 at Unknown time  . budesonide (PULMICORT) 0.25 MG/2ML nebulizer solution Take 2 mLs (0.25 mg total) by nebulization 2 (two) times daily. 60 mL 12 06/19/2018 at Unknown time  . CARTIA XT 120 MG 24 hr capsule Take 1 capsule (120 mg total) by mouth daily. 7 capsule 0 06/19/2018 at Unknown time  . ethambutol (MYAMBUTOL) 400 MG tablet Take 3 tablets (1,200 mg total) by mouth daily. 90 tablet 11 06/19/2018 at Unknown time  . fexofenadine (ALLEGRA) 180 MG tablet Take 180 mg by mouth daily.   06/19/2018 at Unknown time  . fluticasone (FLONASE) 50 MCG/ACT nasal spray Place 2 sprays into both nostrils daily.   5 06/19/2018 at Unknown time  . Fluticasone-Umeclidin-Vilant (TRELEGY ELLIPTA) 100-62.5-25 MCG/INH AEPB Inhale 1 puff into the lungs daily.   06/19/2018 at Unknown time  . guaiFENesin (MUCUS RELIEF) 600 MG 12 hr tablet Take 600 mg by mouth 3 (three) times daily.   06/19/2018 at Unknown time  . insulin glargine (LANTUS) 100 UNIT/ML injection Inject 0.24 mLs (24 Units total) into the skin daily. 24 units daily (Patient taking differently: Inject 26 Units into the skin daily. ) 10 mL 11 06/19/2018 at Unknown time  . insulin lispro  (HUMALOG) 100 UNIT/ML injection Take by sliding scale provided by hospital maximum amount daily is 60 units.  Check blood sugar 4 times a day (Patient taking differently: Inject 0-15 Units into the skin 3 (  three) times daily with meals. Take by sliding scale provided by hospital maximum amount daily is 60 units.  Check blood sugar 4 times a day) 10 mL 11 06/19/2018 at Unknown time  . ipratropium (ATROVENT) 0.02 % nebulizer solution Take 0.5 mg by nebulization 2 (two) times daily.   5 06/19/2018 at Unknown time  . levalbuterol (XOPENEX) 0.63 MG/3ML nebulizer solution Take 0.63 mg by nebulization 2 (two) times daily.   06/19/2018 at Unknown time  . LORazepam (ATIVAN) 0.5 MG tablet Take 1 tablet (0.5 mg total) by mouth 2 (two) times daily as needed for anxiety or sleep. (Patient taking differently: Take 0.5 mg by mouth 4 (four) times daily. ) 20 tablet 0 06/19/2018 at Unknown time  . metFORMIN (GLUCOPHAGE) 500 MG tablet Take 1 tablet (500 mg total) by mouth daily with breakfast. 30 tablet 3 06/19/2018 at Unknown time  . montelukast (SINGULAIR) 10 MG tablet Take 10 mg by mouth at bedtime.   06/18/2018 at Unknown time  . omeprazole (PRILOSEC) 20 MG capsule Take 1 capsule (20 mg total) by mouth 2 (two) times daily before a meal. 60 capsule 5 06/19/2018 at Unknown time  . oxyCODONE-acetaminophen (PERCOCET) 10-325 MG tablet Take 1 tablet by mouth every 4 (four) hours as needed for pain.   06/19/2018 at Unknown time  . predniSONE (DELTASONE) 10 MG tablet Take 2 daily for 1 week then 1 daily (Patient taking differently: Take 20 mg by mouth daily with breakfast. ) 60 tablet 5 06/18/2018 at Unknown time  . rifampin (RIFADIN) 300 MG capsule Take 2 capsules (600 mg total) by mouth daily. 60 capsule 11 06/19/2018 at    . rosuvastatin (CRESTOR) 10 MG tablet Take 10 mg by mouth daily.   11 06/19/2018 at Unknown time  . HYDROcodone-homatropine (HYCODAN) 5-1.5 MG/5ML syrup Take 5 mLs by mouth every 4 (four) hours as needed for cough.  (Patient not taking: Reported on 06/19/2018) 120 mL 0 Not Taking at Unknown time  . nitroGLYCERIN (NITROSTAT) 0.4 MG SL tablet Place 1 tablet (0.4 mg total) under the tongue every 5 (five) minutes as needed for chest pain. 25 tablet 4 unknown   Scheduled: . azithromycin  500 mg Oral Daily  . baclofen  20 mg Oral TID  . budesonide  0.5 mg Nebulization BID  . diltiazem  120 mg Oral Daily  . enoxaparin (LOVENOX) injection  40 mg Subcutaneous Q24H  . ethambutol  1,200 mg Oral Daily  . fluticasone  2 spray Each Nare Daily  . guaiFENesin  600 mg Oral TID  . insulin aspart  0-15 Units Subcutaneous TID WC  . insulin aspart  0-5 Units Subcutaneous QHS  . insulin glargine  26 Units Subcutaneous Daily  . ipratropium  0.5 mg Nebulization BID  . ipratropium-albuterol  3 mL Nebulization Once  . levalbuterol  0.63 mg Nebulization BID  . loratadine  10 mg Oral Daily  . mouth rinse  15 mL Mouth Rinse BID  . montelukast  10 mg Oral QHS  . pantoprazole  40 mg Oral Daily  . predniSONE  20 mg Oral Q breakfast  . rifampin  600 mg Oral Daily  . rosuvastatin  10 mg Oral Daily  . umeclidinium-vilanterol  1 puff Inhalation Daily   Continuous:  XBM:[id number], albuterol, benzonatate, ketorolac, LORazepam, morphine injection, nitroGLYCERIN, ondansetron (ZOFRAN) IV, oxyCODONE-acetaminophen **AND** oxyCODONE  Assesment: He was admitted with left chest pain and has ruled out for acute coronary syndrome.  He has acute on chronic hypoxic respiratory failure  which is multifactorial.  He has severe COPD at baseline.  He has Mycobacterium avium at baseline.  Chest CT that I have personally reviewed shows cavitary lesions 1 of which is larger than in 2017 but actually looks better than the last CAT scan so I think he is improving  He has diabetes which is been doing pretty well at home  He has anxiety on treatment  He has chronic low back pain and that has been stable on pain medication at home  He has reflux  and this may have set off his chest pain Principal Problem:   Left-sided chest pain Active Problems:   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  quit smoking 05/01/15    GERD with stricture   ASCVD (arteriosclerotic cardiovascular disease)   Type 2 diabetes mellitus without complication (HCC)   Atrial fibrillation with rapid ventricular response ([place])   Pulmonary fibrosis ([place])   Chronic respiratory failure with hypoxia ([place])    Plan: Transfer out of stepdown.  Continue other treatments.    LOS: 0 days   Cal Gindlesperger L 06/20/2018, 9:49 AM

## 2018-06-21 LAB — GLUCOSE, CAPILLARY
Glucose-Capillary: 211 mg/dL — ABNORMAL HIGH (ref 70–99)
Glucose-Capillary: 294 mg/dL — ABNORMAL HIGH (ref 70–99)
Glucose-Capillary: 292 mg/dL — ABNORMAL HIGH (ref 70–99)
Glucose-Capillary: 131 mg/dL — ABNORMAL HIGH (ref 70–99)

## 2018-06-21 MED ORDER — IPRATROPIUM BROMIDE 0.02 % IN SOLN
0.5000 mg | Freq: Two times a day (BID) | RESPIRATORY_TRACT | Status: DC
  Administered 2018-06-22 – 2018-06-23 (×2): 0.5 mg via RESPIRATORY_TRACT
  Filled 2018-06-21 (×2): qty 2.5

## 2018-06-21 MED ORDER — LEVALBUTEROL HCL 0.63 MG/3ML IN NEBU
0.6300 mg | INHALATION_SOLUTION | Freq: Two times a day (BID) | RESPIRATORY_TRACT | Status: DC
  Administered 2018-06-22 – 2018-06-23 (×2): 0.63 mg via RESPIRATORY_TRACT
  Filled 2018-06-21 (×2): qty 3

## 2018-06-21 MED ORDER — POLYETHYLENE GLYCOL 3350 17 G PO PACK
17.0000 g | PACK | Freq: Every day | ORAL | Status: DC
  Administered 2018-06-21 – 2018-06-28 (×7): 17 g via ORAL
  Filled 2018-06-21 (×9): qty 1

## 2018-06-21 MED ORDER — BENZONATATE 100 MG PO CAPS
200.0000 mg | ORAL_CAPSULE | Freq: Three times a day (TID) | ORAL | Status: DC
  Administered 2018-06-21 – 2018-06-28 (×22): 200 mg via ORAL
  Filled 2018-06-21 (×22): qty 2

## 2018-06-21 NOTE — Progress Notes (Signed)
Subjective: He feels better but still has chest pain.  No other new complaints.  He is coughing.  His reflux is better.  Objective: Vital signs in last 24 hours: Temp:  [97.7 F (36.5 C)-98.8 F (37.1 C)] 98.8 F (37.1 C) (12/09 0400) Pulse Rate:  [72-90] 84 (12/09 0800) Resp:  [14-22] 18 (12/09 0800) BP: (118-136)/(67-95) 136/95 (12/09 0800) SpO2:  [95 %-100 %] 96 % (12/09 0800) Weight change:  Last BM Date: 06/19/18  Intake/Output from previous day: No intake/output data recorded.  PHYSICAL EXAM General appearance: alert, cooperative and no distress Resp: rhonchi bilaterally Cardio: regular rate and rhythm, S1, S2 normal, no murmur, click, rub or gallop GI: soft, non-tender; bowel sounds normal; no masses,  no organomegaly Extremities: extremities normal, atraumatic, no cyanosis or edema  Lab Results:  Results for orders placed or performed during the hospital encounter of 06/19/18 (from the past 48 hour(s))  Basic metabolic panel     Status: Abnormal   Collection Time: 06/19/18  6:25 PM  Result Value Ref Range   Sodium 135 135 - 145 mmol/L   Potassium 4.5 3.5 - 5.1 mmol/L   Chloride 99 98 - 111 mmol/L   CO2 27 22 - 32 mmol/L   Glucose, Bld 245 (H) 70 - 99 mg/dL   BUN 11 6 - 20 mg/dL   Creatinine, Ser 0.94 0.61 - 1.24 mg/dL   Calcium 8.9 8.9 - 10.3 mg/dL   GFR calc non Af Amer >60 >60 mL/min   GFR calc Af Amer >60 >60 mL/min   Anion gap 9 5 - 15    Comment: Performed at [facility], [address] [place], [place] [address]  CBC with Differential     Status: Abnormal   Collection Time: 06/19/18  6:25 PM  Result Value Ref Range   WBC 5.9 4.0 - 10.5 K/uL   RBC 4.56 4.22 - 5.81 MIL/uL   Hemoglobin 12.4 (L) 13.0 - 17.0 g/dL   HCT 39.4 39.0 - 52.0 %   MCV 86.4 80.0 - 100.0 fL   MCH 27.2 26.0 - 34.0 pg   MCHC 31.5 30.0 - 36.0 g/dL   RDW 13.4 11.5 - 15.5 %   Platelets 164 150 - 400 K/uL   nRBC 0.0 0.0 - 0.2 %   Neutrophils Relative % 61 %   Neutro Abs 3.7 1.7 -  7.7 K/uL   Lymphocytes Relative 22 %   Lymphs Abs 1.3 0.7 - 4.0 K/uL   Monocytes Relative 11 %   Monocytes Absolute 0.6 0.1 - 1.0 K/uL   Eosinophils Relative 4 %   Eosinophils Absolute 0.2 0.0 - 0.5 K/uL   Basophils Relative 1 %   Basophils Absolute 0.0 0.0 - 0.1 K/uL   Immature Granulocytes 1 %   Abs Immature Granulocytes 0.03 0.00 - 0.07 K/uL    Comment: Performed at [facility], [address], [place], [place] [address]  POCT i-Stat troponin I     Status: None   Collection Time: 06/19/18  6:43 PM  Result Value Ref Range   Troponin i, poc 0.00 0.00 - 0.08 ng/mL   Comment 3            Comment: Due to the release kinetics of cTnI, a negative result within the first hours of the onset of symptoms does not rule out myocardial infarction with certainty. If myocardial infarction is still suspected, repeat the test at appropriate intervals.   Troponin I - Now Then Q6H  Status: None   Collection Time: 06/19/18 10:05 PM  Result Value Ref Range   Troponin I <0.03 <0.03 ng/mL    Comment: Performed at [facility], [address], [place], [place] [address]  MRSA PCR Screening     Status: None   Collection Time: 06/19/18 10:06 PM  Result Value Ref Range   MRSA by PCR NEGATIVE NEGATIVE    Comment:        The GeneXpert MRSA Assay (FDA approved for NASAL specimens only), is one component of a comprehensive MRSA colonization surveillance program. It is not intended to diagnose MRSA infection nor to guide or monitor treatment for MRSA infections. Performed at [facility], [address], [place], [place] [address]   Glucose, capillary     Status: Abnormal   Collection Time: 06/19/18 10:23 PM  Result Value Ref Range   Glucose-Capillary 208 (H) 70 - 99 mg/dL  Troponin I - Now Then Q6H     Status: None   Collection Time: 06/20/18  4:00 AM  Result Value Ref Range   Troponin I <0.03 <0.03 ng/mL    Comment: Performed at [facility], [address], [place], [place] [address]   Glucose, capillary     Status: Abnormal   Collection Time: 06/20/18  8:47 AM  Result Value Ref Range   Glucose-Capillary 184 (H) 70 - 99 mg/dL  Troponin I - Now Then Q6H     Status: None   Collection Time: 06/20/18  9:56 AM  Result Value Ref Range   Troponin I <0.03 <0.03 ng/mL    Comment: Performed at [facility], [address], [place], [place] [address]  Glucose, capillary     Status: Abnormal   Collection Time: 06/20/18 11:24 AM  Result Value Ref Range   Glucose-Capillary 404 (H) 70 - 99 mg/dL  Glucose, capillary     Status: Abnormal   Collection Time: 06/20/18  4:55 PM  Result Value Ref Range   Glucose-Capillary 231 (H) 70 - 99 mg/dL  Glucose, capillary     Status: Abnormal   Collection Time: 06/20/18  9:35 PM  Result Value Ref Range   Glucose-Capillary 167 (H) 70 - 99 mg/dL   Comment 1 Notify RN    Comment 2 Document in Chart   Glucose, capillary     Status: Abnormal   Collection Time: 06/21/18  8:26 AM  Result Value Ref Range   Glucose-Capillary 211 (H) 70 - 99 mg/dL   Comment 1 Notify RN     ABGS No results for input(s): PHART, PO2ART, TCO2, HCO3 in the last 72 hours.  Invalid input(s): PCO2 CULTURES Recent Results (from the past 240 hour(s))  MRSA PCR Screening     Status: None   Collection Time: 06/19/18 10:06 PM  Result Value Ref Range Status   MRSA by PCR NEGATIVE NEGATIVE Final    Comment:        The GeneXpert MRSA Assay (FDA approved for NASAL specimens only), is one component of a comprehensive MRSA colonization surveillance program. It is not intended to diagnose MRSA infection nor to guide or monitor treatment for MRSA infections. Performed at [facility], [address], [place],  [address]    Studies/Results: Dg Chest 2 View  Result Date: 06/19/2018 CLINICAL DATA:  Chest pain today. EXAM: CHEST - 2 VIEW COMPARISON:  04/19/2018 FINDINGS: Heart size is normal.  Aortic atherosclerosis. Moderate to severe emphysema again demonstrated.  Scarring is again seen predominately in the left lung, which is unchanged.  No evidence of pulmonary infiltrate or edema. No evidence of pneumothorax or pleural effusion. IMPRESSION: Stable emphysema and left lung scarring.  No active disease. Electronically Signed   By: Earle Gell M.D.   On: 06/19/2018 17:48   Ct Angio Chest Pe W/cm &/or Wo Cm  Result Date: 06/19/2018 CLINICAL DATA:  Chest pain EXAM: CT ANGIOGRAPHY CHEST WITH CONTRAST TECHNIQUE: Multidetector CT imaging of the chest was performed using the standard protocol during bolus administration of intravenous contrast. Multiplanar CT image reconstructions and MIPs were obtained to evaluate the vascular anatomy. CONTRAST:  74mL ISOVUE-370 IOPAMIDOL (ISOVUE-370) INJECTION 76% COMPARISON:  Chest radiographs dated 06/19/2018. CT chest dated 09/01/2017. FINDINGS: Cardiovascular: Satisfactory opacification of the bilateral pulmonary arteries to the lobar level. No evidence of pulmonary embolism. No evidence of thoracic aortic aneurysm or dissection. Mild atherosclerotic calcifications of the aortic arch. 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 Mediastinum/Nodes: No suspicious mediastinal lymphadenopathy. Visualized thyroid is unremarkable. Lungs/Pleura: Tracheobronchomalacia. 1.7 x 4.6 cm cavitary left upper lobe lesion (series 7/image 35), previously 2.7 x 5.2 cm, now with a relatively thin wall. Mild wall thickening persists inferiorly (series 7/image 48), but overall the superimposed infection/inflammation has improved. 2.2 x 6.0 cm thick-walled cavitary lesion in the posterior left lower lobe (series 7/image 90), previously 2.7 x 6.0 cm when measured in a similar fashion on the prior study, with persistent but decreased solid component laterally (series 7/image 82). However, this lesion is progressive when compared to prior CTs from 2017-18. Extensive centrilobular and paraseptal emphysematous changes. Mild scarring/atelectasis in the  medial right middle lobe. No focal consolidation. No new/suspicious pulmonary nodules. No pleural effusion or pneumothorax. Upper Abdomen: Visualized upper abdomen is notable for mild hepatic steatosis. Musculoskeletal: Visualized osseous structures are within normal limits. Review of the MIP images confirms the above findings. IMPRESSION: No evidence of pulmonary embolism. Cavitary lesions in the left upper and lower lobes are stable versus mildly decreased, with decreased superimposed infection/inflammation when compared to recent priors. However, the left lower lobe lesion is progressive when compared to 2017-18. As such, attention on follow-up is suggested. Tracheobronchomalacia. Aortic Atherosclerosis (ICD10-I70.0) and Emphysema (ICD10-J43.9). Electronically Signed   By: Julian Hy M.D.   On: 06/19/2018 20:36    Medications:  Prior to Admission:  Medications Prior to Admission  Medication Sig Dispense Refill Last Dose  . albuterol (VENTOLIN HFA) 108 (90 BASE) MCG/ACT inhaler Inhale 2 puffs into the lungs every 6 (six) hours as needed for wheezing or shortness of breath.   06/19/2018 at Unknown time  . azithromycin (ZITHROMAX) 500 MG tablet Take 500 mg by mouth daily.   06/19/2018 at Unknown time  . baclofen (LIORESAL) 20 MG tablet Take 20 mg by mouth 3 (three) times daily.    06/19/2018 at Unknown time  . benzonatate (TESSALON) 200 MG capsule Take 200 mg by mouth 3 (three) times daily.   06/19/2018 at Unknown time  . budesonide (PULMICORT) 0.25 MG/2ML nebulizer solution Take 2 mLs (0.25 mg total) by nebulization 2 (two) times daily. 60 mL 12 06/19/2018 at Unknown time  . CARTIA XT 120 MG 24 hr capsule Take 1 capsule (120 mg total) by mouth daily. 7 capsule 0 06/19/2018 at Unknown time  . ethambutol (MYAMBUTOL) 400 MG tablet Take 3 tablets (1,200 mg total) by mouth daily. 90 tablet 11 06/19/2018 at Unknown time  . fexofenadine (ALLEGRA) 180 MG tablet Take 180 mg by mouth daily.   06/19/2018 at  Unknown time  . fluticasone (FLONASE) 50  MCG/ACT nasal spray Place 2 sprays into both nostrils daily.   5 06/19/2018 at Unknown time  . Fluticasone-Umeclidin-Vilant (TRELEGY ELLIPTA) 100-62.5-25 MCG/INH AEPB Inhale 1 puff into the lungs daily.   06/19/2018 at Unknown time  . guaiFENesin (MUCUS RELIEF) 600 MG 12 hr tablet Take 600 mg by mouth 3 (three) times daily.   06/19/2018 at Unknown time  . insulin glargine (LANTUS) 100 UNIT/ML injection Inject 0.24 mLs (24 Units total) into the skin daily. 24 units daily (Patient taking differently: Inject 26 Units into the skin daily. ) 10 mL 11 06/19/2018 at Unknown time  . insulin lispro (HUMALOG) 100 UNIT/ML injection Take by sliding scale provided by hospital maximum amount daily is 60 units.  Check blood sugar 4 times a day (Patient taking differently: Inject 0-15 Units into the skin 3 (three) times daily with meals. Take by sliding scale provided by hospital maximum amount daily is 60 units.  Check blood sugar 4 times a day) 10 mL 11 06/19/2018 at Unknown time  . ipratropium (ATROVENT) 0.02 % nebulizer solution Take 0.5 mg by nebulization 2 (two) times daily.   5 06/19/2018 at Unknown time  . levalbuterol (XOPENEX) 0.63 MG/3ML nebulizer solution Take 0.63 mg by nebulization 2 (two) times daily.   06/19/2018 at Unknown time  . LORazepam (ATIVAN) 0.5 MG tablet Take 1 tablet (0.5 mg total) by mouth 2 (two) times daily as needed for anxiety or sleep. (Patient taking differently: Take 0.5 mg by mouth 4 (four) times daily. ) 20 tablet 0 06/19/2018 at Unknown time  . metFORMIN (GLUCOPHAGE) 500 MG tablet Take 1 tablet (500 mg total) by mouth daily with breakfast. 30 tablet 3 06/19/2018 at Unknown time  . montelukast (SINGULAIR) 10 MG tablet Take 10 mg by mouth at bedtime.   06/18/2018 at Unknown time  . omeprazole (PRILOSEC) 20 MG capsule Take 1 capsule (20 mg total) by mouth 2 (two) times daily before a meal. 60 capsule 5 06/19/2018 at Unknown time  . oxyCODONE-acetaminophen  (PERCOCET) 10-325 MG tablet Take 1 tablet by mouth every 4 (four) hours as needed for pain.   06/19/2018 at Unknown time  . predniSONE (DELTASONE) 10 MG tablet Take 2 daily for 1 week then 1 daily (Patient taking differently: Take 20 mg by mouth daily with breakfast. ) 60 tablet 5 06/18/2018 at Unknown time  . rifampin (RIFADIN) 300 MG capsule Take 2 capsules (600 mg total) by mouth daily. 60 capsule 11 06/19/2018 at    . rosuvastatin (CRESTOR) 10 MG tablet Take 10 mg by mouth daily.   11 06/19/2018 at Unknown time  . HYDROcodone-homatropine (HYCODAN) 5-1.5 MG/5ML syrup Take 5 mLs by mouth every 4 (four) hours as needed for cough. (Patient not taking: Reported on 06/19/2018) 120 mL 0 Not Taking at Unknown time  . nitroGLYCERIN (NITROSTAT) 0.4 MG SL tablet Place 1 tablet (0.4 mg total) under the tongue every 5 (five) minutes as needed for chest pain. 25 tablet 4 unknown   Scheduled: . azithromycin  500 mg Oral Daily  . baclofen  20 mg Oral TID  . benzonatate  200 mg Oral TID  . budesonide  0.5 mg Nebulization BID  . diltiazem  120 mg Oral Daily  . enoxaparin (LOVENOX) injection  40 mg Subcutaneous Q24H  . ethambutol  1,200 mg Oral Daily  . fluticasone  2 spray Each Nare Daily  . guaiFENesin  600 mg Oral TID  . insulin aspart  0-15 Units Subcutaneous TID WC  . insulin aspart  0-5 Units Subcutaneous QHS  . insulin glargine  26 Units Subcutaneous Daily  . ipratropium  0.5 mg Nebulization BID  . ipratropium-albuterol  3 mL Nebulization Once  . levalbuterol  0.63 mg Nebulization BID  . loratadine  10 mg Oral Daily  . mouth rinse  15 mL Mouth Rinse BID  . montelukast  10 mg Oral QHS  . pantoprazole  40 mg Oral Daily  . polyethylene glycol  17 g Oral Daily  . predniSONE  20 mg Oral Q breakfast  . rifampin  600 mg Oral Daily  . rosuvastatin  10 mg Oral Daily  . umeclidinium-vilanterol  1 puff Inhalation Daily   Continuous:  [id number], albuterol, ketorolac, LORazepam, morphine injection,  nitroGLYCERIN, ondansetron (ZOFRAN) IV, oxyCODONE-acetaminophen **AND** oxyCODONE  Assesment: He was admitted with left-sided chest pain that seems to be more chest wall pain.  This is still present.  He has COPD at baseline and that still a problem.  He has chronic hypoxic respiratory failure on oxygen stable  He has had episodes of atrial for but nothing recently and he is not anticoagulated because of interference with his antibacterials.   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  His CT which I have personally reviewed shows 2 cavitary lesions and the one in the posterior left lower lobe is smaller than on the most recent CT but larger than on CTs from 2017 and 2018.  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  I have orders for him to move out of stepdown.  He can be.    LOS: 1 day   Annastasia Haskins L 06/21/2018, 8:31 AM

## 2018-06-21 NOTE — Care Management Note (Addendum)
Case Management Note  Patient Details  Name: Ryan [name] MRN: [id number] Date of Birth: 1960-08-22  Subjective/Objective:       Chest pain. Chronic hypoxic respiratory failure history.  Lives with wife and son, independent. He has home oxygen (baseline 6 liters) and neb machine. He has had [facility] in the past.  This is his third admission in six months.   He is listed on[facility] registry but declines services.                      Action/Plan: DC home.   Expected Discharge Date:       06/22/2018           Expected Discharge Plan:  Home/Self Care  In-House Referral:     Discharge planning Services  CM Consult  Post Acute Care Choice:  NA Choice offered to:  NA  DME Arranged:    DME Agency:     HH Arranged:    HH Agency:     Status of Service:  Completed, signed off  If discussed at [employer] of Stay Meetings, dates discussed:    Additional Comments:  Everlene Cunning, Chauncey Reading, RN 06/21/2018, 2:34 PM

## 2018-06-22 LAB — GLUCOSE, CAPILLARY
Glucose-Capillary: 191 mg/dL — ABNORMAL HIGH (ref 70–99)
Glucose-Capillary: 197 mg/dL — ABNORMAL HIGH (ref 70–99)
Glucose-Capillary: 354 mg/dL — ABNORMAL HIGH (ref 70–99)
Glucose-Capillary: 243 mg/dL — ABNORMAL HIGH (ref 70–99)
Glucose-Capillary: 176 mg/dL — ABNORMAL HIGH (ref 70–99)

## 2018-06-22 NOTE — Progress Notes (Signed)
Inpatient Diabetes Program Recommendations  AACE/ADA: New Consensus Statement on Inpatient Glycemic Control (2019)  Target Ranges:  Prepandial:   less than 140 mg/dL      Peak postprandial:   less than 180 mg/dL (1-2 hours)      Critically ill patients:  140 - 180 mg/dL   Results for ELMON, SHADER (MRN [id number]) as of 06/22/2018 08:19  Ref. Range 06/21/2018 08:26 06/21/2018 11:44 06/21/2018 17:09 06/21/2018 22:17  Glucose-Capillary Latest Ref Range: 70 - 99 mg/dL 211 (H) 294 (H) 292 (H) 131 (H)   Review of Glycemic Control  Diabetes history: DM2 Outpatient Diabetes medications: Lantus 26 units daily, Humalog 0-15 units TID with meals, Metformin 500 mg QAM; Prednisone 20 mg daily Current orders for Inpatient glycemic control: Lantus 26 units daily, Novolog 0-15 units TID with meals, Novolog 0-5 units QHS; Prednisone 20 mg QAM  Inpatient Diabetes Program Recommendations:   Insulin - Meal Coverage: Please consider ordering Novolog 4 units TID with meals for meal coverage if patient eats at least 50% of meals.  Thanks, Barnie Alderman, RN, MSN, CDE Diabetes Coordinator Inpatient Diabetes Program [phone] (Team Pager from 8am to 5pm)

## 2018-06-22 NOTE — Progress Notes (Signed)
Subjective: He was admitted with chest discomfort.  He ruled out for acute coronary syndrome but he still having the pain.  This is acute on chronic pain.  He has generalized back pain at baseline.  He says his breathing is okay.  He is not having any swelling of his legs.  Objective: Vital signs in last 24 hours: Temp:  [97.6 F (36.4 C)-98.3 F (36.8 C)] 97.7 F (36.5 C) (12/10 0510) Pulse Rate:  [69-103] 69 (12/10 0510) Resp:  [14-24] 16 (12/10 0510) BP: (108-158)/(61-95) 108/61 (12/10 0510) SpO2:  [95 %-100 %] 100 % (12/10 0510) Weight change:  Last BM Date: 06/21/18  Intake/Output from previous day: 12/09 0701 - 12/10 0700 In: -  Out: 725 [Urine:725]  PHYSICAL EXAM General appearance: alert, cooperative and mild distress Resp: rhonchi bilaterally Cardio: regular rate and rhythm, S1, S2 normal, no murmur, click, rub or gallop GI: soft, non-tender; bowel sounds normal; no masses,  no organomegaly Extremities: extremities normal, atraumatic, no cyanosis or edema  Lab Results:  Results for orders placed or performed during the hospital encounter of 06/19/18 (from the past 48 hour(s))  Glucose, capillary     Status: Abnormal   Collection Time: 06/20/18  8:47 AM  Result Value Ref Range   Glucose-Capillary 184 (H) 70 - 99 mg/dL  Troponin I - Now Then Q6H     Status: None   Collection Time: 06/20/18  9:56 AM  Result Value Ref Range   Troponin I <0.03 <0.03 ng/mL    Comment: Performed at [facility], [address], [place], [place] [address]  Glucose, capillary     Status: Abnormal   Collection Time: 06/20/18 11:24 AM  Result Value Ref Range   Glucose-Capillary 404 (H) 70 - 99 mg/dL  Glucose, capillary     Status: Abnormal   Collection Time: 06/20/18  4:55 PM  Result Value Ref Range   Glucose-Capillary 231 (H) 70 - 99 mg/dL  Glucose, capillary     Status: Abnormal   Collection Time: 06/20/18  9:35 PM  Result Value Ref Range   Glucose-Capillary 167 (H) 70 - 99 mg/dL    Comment 1 Notify RN    Comment 2 Document in Chart   Glucose, capillary     Status: Abnormal   Collection Time: 06/21/18  8:26 AM  Result Value Ref Range   Glucose-Capillary 211 (H) 70 - 99 mg/dL   Comment 1 Notify RN   Glucose, capillary     Status: Abnormal   Collection Time: 06/21/18 11:44 AM  Result Value Ref Range   Glucose-Capillary 294 (H) 70 - 99 mg/dL  Glucose, capillary     Status: Abnormal   Collection Time: 06/21/18  5:09 PM  Result Value Ref Range   Glucose-Capillary 292 (H) 70 - 99 mg/dL  Glucose, capillary     Status: Abnormal   Collection Time: 06/21/18 10:17 PM  Result Value Ref Range   Glucose-Capillary 131 (H) 70 - 99 mg/dL    ABGS No results for input(s): PHART, PO2ART, TCO2, HCO3 in the last 72 hours.  Invalid input(s): PCO2 CULTURES Recent Results (from the past 240 hour(s))  MRSA PCR Screening     Status: None   Collection Time: 06/19/18 10:06 PM  Result Value Ref Range Status   MRSA by PCR NEGATIVE NEGATIVE Final    Comment:        The GeneXpert MRSA Assay (FDA approved for NASAL specimens only), is one component of a comprehensive MRSA colonization surveillance program.  It is not intended to diagnose MRSA infection nor to guide or monitor treatment for MRSA infections. Performed at [facility], [address] [place], [place] [address]    Studies/Results: No results found.  Medications:  Prior to Admission:  Medications Prior to Admission  Medication Sig Dispense Refill Last Dose  . albuterol (VENTOLIN HFA) 108 (90 BASE) MCG/ACT inhaler Inhale 2 puffs into the lungs every 6 (six) hours as needed for wheezing or shortness of breath.   06/19/2018 at Unknown time  . azithromycin (ZITHROMAX) 500 MG tablet Take 500 mg by mouth daily.   06/19/2018 at Unknown time  . baclofen (LIORESAL) 20 MG tablet Take 20 mg by mouth 3 (three) times daily.    06/19/2018 at Unknown time  . benzonatate (TESSALON) 200 MG capsule Take 200 mg by mouth 3 (three) times  daily.   06/19/2018 at Unknown time  . budesonide (PULMICORT) 0.25 MG/2ML nebulizer solution Take 2 mLs (0.25 mg total) by nebulization 2 (two) times daily. 60 mL 12 06/19/2018 at Unknown time  . CARTIA XT 120 MG 24 hr capsule Take 1 capsule (120 mg total) by mouth daily. 7 capsule 0 06/19/2018 at Unknown time  . ethambutol (MYAMBUTOL) 400 MG tablet Take 3 tablets (1,200 mg total) by mouth daily. 90 tablet 11 06/19/2018 at Unknown time  . fexofenadine (ALLEGRA) 180 MG tablet Take 180 mg by mouth daily.   06/19/2018 at Unknown time  . fluticasone (FLONASE) 50 MCG/ACT nasal spray Place 2 sprays into both nostrils daily.   5 06/19/2018 at Unknown time  . Fluticasone-Umeclidin-Vilant (TRELEGY ELLIPTA) 100-62.5-25 MCG/INH AEPB Inhale 1 puff into the lungs daily.   06/19/2018 at Unknown time  . guaiFENesin (MUCUS RELIEF) 600 MG 12 hr tablet Take 600 mg by mouth 3 (three) times daily.   06/19/2018 at Unknown time  . insulin glargine (LANTUS) 100 UNIT/ML injection Inject 0.24 mLs (24 Units total) into the skin daily. 24 units daily (Patient taking differently: Inject 26 Units into the skin daily. ) 10 mL 11 06/19/2018 at Unknown time  . insulin lispro (HUMALOG) 100 UNIT/ML injection Take by sliding scale provided by hospital maximum amount daily is 60 units.  Check blood sugar 4 times a day (Patient taking differently: Inject 0-15 Units into the skin 3 (three) times daily with meals. Take by sliding scale provided by hospital maximum amount daily is 60 units.  Check blood sugar 4 times a day) 10 mL 11 06/19/2018 at Unknown time  . ipratropium (ATROVENT) 0.02 % nebulizer solution Take 0.5 mg by nebulization 2 (two) times daily.   5 06/19/2018 at Unknown time  . levalbuterol (XOPENEX) 0.63 MG/3ML nebulizer solution Take 0.63 mg by nebulization 2 (two) times daily.   06/19/2018 at Unknown time  . LORazepam (ATIVAN) 0.5 MG tablet Take 1 tablet (0.5 mg total) by mouth 2 (two) times daily as needed for anxiety or sleep. (Patient  taking differently: Take 0.5 mg by mouth 4 (four) times daily. ) 20 tablet 0 06/19/2018 at Unknown time  . metFORMIN (GLUCOPHAGE) 500 MG tablet Take 1 tablet (500 mg total) by mouth daily with breakfast. 30 tablet 3 06/19/2018 at Unknown time  . montelukast (SINGULAIR) 10 MG tablet Take 10 mg by mouth at bedtime.   06/18/2018 at Unknown time  . omeprazole (PRILOSEC) 20 MG capsule Take 1 capsule (20 mg total) by mouth 2 (two) times daily before a meal. 60 capsule 5 06/19/2018 at Unknown time  . oxyCODONE-acetaminophen (PERCOCET) 10-325 MG tablet Take  1 tablet by mouth every 4 (four) hours as needed for pain.   06/19/2018 at Unknown time  . predniSONE (DELTASONE) 10 MG tablet Take 2 daily for 1 week then 1 daily (Patient taking differently: Take 20 mg by mouth daily with breakfast. ) 60 tablet 5 06/18/2018 at Unknown time  . rifampin (RIFADIN) 300 MG capsule Take 2 capsules (600 mg total) by mouth daily. 60 capsule 11 06/19/2018 at    . rosuvastatin (CRESTOR) 10 MG tablet Take 10 mg by mouth daily.   11 06/19/2018 at Unknown time  . HYDROcodone-homatropine (HYCODAN) 5-1.5 MG/5ML syrup Take 5 mLs by mouth every 4 (four) hours as needed for cough. (Patient not taking: Reported on 06/19/2018) 120 mL 0 Not Taking at Unknown time  . nitroGLYCERIN (NITROSTAT) 0.4 MG SL tablet Place 1 tablet (0.4 mg total) under the tongue every 5 (five) minutes as needed for chest pain. 25 tablet 4 unknown   Scheduled: . azithromycin  500 mg Oral Daily  . baclofen  20 mg Oral TID  . benzonatate  200 mg Oral TID  . budesonide  0.5 mg Nebulization BID  . diltiazem  120 mg Oral Daily  . enoxaparin (LOVENOX) injection  40 mg Subcutaneous Q24H  . ethambutol  1,200 mg Oral Daily  . fluticasone  2 spray Each Nare Daily  . guaiFENesin  600 mg Oral TID  . insulin aspart  0-15 Units Subcutaneous TID WC  . insulin aspart  0-5 Units Subcutaneous QHS  . insulin glargine  26 Units Subcutaneous Daily  . ipratropium  0.5 mg Nebulization BID   . ipratropium-albuterol  3 mL Nebulization Once  . levalbuterol  0.63 mg Nebulization BID  . loratadine  10 mg Oral Daily  . mouth rinse  15 mL Mouth Rinse BID  . montelukast  10 mg Oral QHS  . pantoprazole  40 mg Oral Daily  . polyethylene glycol  17 g Oral Daily  . predniSONE  20 mg Oral Q breakfast  . rifampin  600 mg Oral Daily  . rosuvastatin  10 mg Oral Daily  . umeclidinium-vilanterol  1 puff Inhalation Daily   Continuous:  BBC:[id number], albuterol, ketorolac, LORazepam, morphine injection, nitroGLYCERIN, ondansetron (ZOFRAN) IV, oxyCODONE-acetaminophen **AND** oxyCODONE  Assesment: He has chest pain which seems to be noncardiac.  He is improving slowly but is still complaining of pain and still requiring intravenous pain medication   He has pretty severe COPD at baseline and that is stable  He has diabetes which is doing pretty well  He has chronic hypoxic respiratory failure with acute exacerbation and he is stable now.  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  Likely discharge tomorrow    LOS: 2 days   Jomaira Darr L 06/22/2018, 7:58 AM

## 2018-06-22 NOTE — Care Management Important Message (Signed)
Important Message  Patient Details  Name: Ryan [name] MRN: [id number] Date of Birth: Mar 30, 1961   Medicare Important Message Given:  Yes    [name] 06/22/2018, 1:15 PM

## 2018-06-23 LAB — GLUCOSE, CAPILLARY
Glucose-Capillary: 222 mg/dL — ABNORMAL HIGH (ref 70–99)
Glucose-Capillary: 284 mg/dL — ABNORMAL HIGH (ref 70–99)
Glucose-Capillary: 212 mg/dL — ABNORMAL HIGH (ref 70–99)
Glucose-Capillary: 163 mg/dL — ABNORMAL HIGH (ref 70–99)

## 2018-06-23 LAB — INFLUENZA PANEL BY PCR (TYPE A & B)
Influenza A By PCR: NEGATIVE
Influenza B By PCR: NEGATIVE

## 2018-06-23 MED ORDER — LEVALBUTEROL HCL 0.63 MG/3ML IN NEBU
0.6300 mg | INHALATION_SOLUTION | Freq: Four times a day (QID) | RESPIRATORY_TRACT | Status: DC
  Administered 2018-06-23 – 2018-06-28 (×20): 0.63 mg via RESPIRATORY_TRACT
  Filled 2018-06-23 (×19): qty 3

## 2018-06-23 MED ORDER — HYDROCODONE-HOMATROPINE 5-1.5 MG/5ML PO SYRP
5.0000 mL | ORAL_SOLUTION | ORAL | Status: DC | PRN
  Administered 2018-06-23 – 2018-06-28 (×13): 5 mL via ORAL
  Filled 2018-06-23 (×13): qty 5

## 2018-06-23 MED ORDER — SODIUM CHLORIDE 0.9 % IV SOLN
1.0000 g | INTRAVENOUS | Status: DC
  Administered 2018-06-23 – 2018-06-28 (×6): 1 g via INTRAVENOUS
  Filled 2018-06-23: qty 1
  Filled 2018-06-23 (×2): qty 10
  Filled 2018-06-23 (×4): qty 1

## 2018-06-23 MED ORDER — MENTHOL 3 MG MT LOZG
1.0000 | LOZENGE | OROMUCOSAL | Status: DC | PRN
  Administered 2018-06-24 – 2018-06-25 (×3): 3 mg via ORAL
  Filled 2018-06-23 (×3): qty 9

## 2018-06-23 NOTE — Progress Notes (Signed)
Subjective: He says he feels much worse today.  He is having sore throat and aching all over like the flu.  He is allergic to flu vaccine so he did not get 1 this year.  He has no other new complaints.  He is not having any swelling.  His chest pain is better.  Objective: Vital signs in last 24 hours: Temp:  [98 F (36.7 C)-98.4 F (36.9 C)] 98 F (36.7 C) (12/11 0700) Pulse Rate:  [70-78] 77 (12/11 0700) Resp:  [18-20] 18 (12/11 0700) BP: (111-134)/(60-98) 111/66 (12/11 0700) SpO2:  [98 %-100 %] 100 % (12/11 0800) Weight change:  Last BM Date: 06/21/18  Intake/Output from previous day: 12/10 0701 - 12/11 0700 In: 360 [P.O.:360] Out: 600 [Urine:600]  PHYSICAL EXAM General appearance: alert, cooperative and mild distress Resp: rhonchi bilaterally Cardio: regular rate and rhythm, S1, S2 normal, no murmur, click, rub or gallop GI: soft, non-tender; bowel sounds normal; no masses,  no organomegaly Extremities: extremities normal, atraumatic, no cyanosis or edema  Lab Results:  Results for orders placed or performed during the hospital encounter of 06/19/18 (from the past 48 hour(s))  Glucose, capillary     Status: Abnormal   Collection Time: 06/21/18 11:44 AM  Result Value Ref Range   Glucose-Capillary 294 (H) 70 - 99 mg/dL  Glucose, capillary     Status: Abnormal   Collection Time: 06/21/18  5:09 PM  Result Value Ref Range   Glucose-Capillary 292 (H) 70 - 99 mg/dL  Glucose, capillary     Status: Abnormal   Collection Time: 06/21/18  8:52 PM  Result Value Ref Range   Glucose-Capillary 191 (H) 70 - 99 mg/dL  Glucose, capillary     Status: Abnormal   Collection Time: 06/21/18 10:17 PM  Result Value Ref Range   Glucose-Capillary 131 (H) 70 - 99 mg/dL  Glucose, capillary     Status: Abnormal   Collection Time: 06/22/18  8:20 AM  Result Value Ref Range   Glucose-Capillary 197 (H) 70 - 99 mg/dL  Glucose, capillary     Status: Abnormal   Collection Time: 06/22/18 11:29 AM   Result Value Ref Range   Glucose-Capillary 354 (H) 70 - 99 mg/dL   Comment 1 Notify RN    Comment 2 Document in Chart   Glucose, capillary     Status: Abnormal   Collection Time: 06/22/18  4:43 PM  Result Value Ref Range   Glucose-Capillary 243 (H) 70 - 99 mg/dL   Comment 1 Notify RN    Comment 2 Document in Chart   Glucose, capillary     Status: Abnormal   Collection Time: 06/22/18 10:26 PM  Result Value Ref Range   Glucose-Capillary 176 (H) 70 - 99 mg/dL  Glucose, capillary     Status: Abnormal   Collection Time: 06/23/18  7:31 AM  Result Value Ref Range   Glucose-Capillary 222 (H) 70 - 99 mg/dL    ABGS No results for input(s): PHART, PO2ART, TCO2, HCO3 in the last 72 hours.  Invalid input(s): PCO2 CULTURES Recent Results (from the past 240 hour(s))  MRSA PCR Screening     Status: None   Collection Time: 06/19/18 10:06 PM  Result Value Ref Range Status   MRSA by PCR NEGATIVE NEGATIVE Final    Comment:        The GeneXpert MRSA Assay (FDA approved for NASAL specimens only), is one component of a comprehensive MRSA colonization surveillance program. It is not intended to diagnose MRSA  infection nor to guide or monitor treatment for MRSA infections. Performed at [facility], [address] [place], [place] [address]    Studies/Results: No results found.  Medications:  Prior to Admission:  Medications Prior to Admission  Medication Sig Dispense Refill Last Dose  . albuterol (VENTOLIN HFA) 108 (90 BASE) MCG/ACT inhaler Inhale 2 puffs into the lungs every 6 (six) hours as needed for wheezing or shortness of breath.   06/19/2018 at Unknown time  . azithromycin (ZITHROMAX) 500 MG tablet Take 500 mg by mouth daily.   06/19/2018 at Unknown time  . baclofen (LIORESAL) 20 MG tablet Take 20 mg by mouth 3 (three) times daily.    06/19/2018 at Unknown time  . benzonatate (TESSALON) 200 MG capsule Take 200 mg by mouth 3 (three) times daily.   06/19/2018 at Unknown time  .  budesonide (PULMICORT) 0.25 MG/2ML nebulizer solution Take 2 mLs (0.25 mg total) by nebulization 2 (two) times daily. 60 mL 12 06/19/2018 at Unknown time  . CARTIA XT 120 MG 24 hr capsule Take 1 capsule (120 mg total) by mouth daily. 7 capsule 0 06/19/2018 at Unknown time  . ethambutol (MYAMBUTOL) 400 MG tablet Take 3 tablets (1,200 mg total) by mouth daily. 90 tablet 11 06/19/2018 at Unknown time  . fexofenadine (ALLEGRA) 180 MG tablet Take 180 mg by mouth daily.   06/19/2018 at Unknown time  . fluticasone (FLONASE) 50 MCG/ACT nasal spray Place 2 sprays into both nostrils daily.   5 06/19/2018 at Unknown time  . Fluticasone-Umeclidin-Vilant (TRELEGY ELLIPTA) 100-62.5-25 MCG/INH AEPB Inhale 1 puff into the lungs daily.   06/19/2018 at Unknown time  . guaiFENesin (MUCUS RELIEF) 600 MG 12 hr tablet Take 600 mg by mouth 3 (three) times daily.   06/19/2018 at Unknown time  . insulin glargine (LANTUS) 100 UNIT/ML injection Inject 0.24 mLs (24 Units total) into the skin daily. 24 units daily (Patient taking differently: Inject 26 Units into the skin daily. ) 10 mL 11 06/19/2018 at Unknown time  . insulin lispro (HUMALOG) 100 UNIT/ML injection Take by sliding scale provided by hospital maximum amount daily is 60 units.  Check blood sugar 4 times a day (Patient taking differently: Inject 0-15 Units into the skin 3 (three) times daily with meals. Take by sliding scale provided by hospital maximum amount daily is 60 units.  Check blood sugar 4 times a day) 10 mL 11 06/19/2018 at Unknown time  . ipratropium (ATROVENT) 0.02 % nebulizer solution Take 0.5 mg by nebulization 2 (two) times daily.   5 06/19/2018 at Unknown time  . levalbuterol (XOPENEX) 0.63 MG/3ML nebulizer solution Take 0.63 mg by nebulization 2 (two) times daily.   06/19/2018 at Unknown time  . LORazepam (ATIVAN) 0.5 MG tablet Take 1 tablet (0.5 mg total) by mouth 2 (two) times daily as needed for anxiety or sleep. (Patient taking differently: Take 0.5 mg by  mouth 4 (four) times daily. ) 20 tablet 0 06/19/2018 at Unknown time  . metFORMIN (GLUCOPHAGE) 500 MG tablet Take 1 tablet (500 mg total) by mouth daily with breakfast. 30 tablet 3 06/19/2018 at Unknown time  . montelukast (SINGULAIR) 10 MG tablet Take 10 mg by mouth at bedtime.   06/18/2018 at Unknown time  . omeprazole (PRILOSEC) 20 MG capsule Take 1 capsule (20 mg total) by mouth 2 (two) times daily before a meal. 60 capsule 5 06/19/2018 at Unknown time  . oxyCODONE-acetaminophen (PERCOCET) 10-325 MG tablet Take 1 tablet by mouth every 4 (four)  hours as needed for pain.   06/19/2018 at Unknown time  . predniSONE (DELTASONE) 10 MG tablet Take 2 daily for 1 week then 1 daily (Patient taking differently: Take 20 mg by mouth daily with breakfast. ) 60 tablet 5 06/18/2018 at Unknown time  . rifampin (RIFADIN) 300 MG capsule Take 2 capsules (600 mg total) by mouth daily. 60 capsule 11 06/19/2018 at    . rosuvastatin (CRESTOR) 10 MG tablet Take 10 mg by mouth daily.   11 06/19/2018 at Unknown time  . HYDROcodone-homatropine (HYCODAN) 5-1.5 MG/5ML syrup Take 5 mLs by mouth every 4 (four) hours as needed for cough. (Patient not taking: Reported on 06/19/2018) 120 mL 0 Not Taking at Unknown time  . nitroGLYCERIN (NITROSTAT) 0.4 MG SL tablet Place 1 tablet (0.4 mg total) under the tongue every 5 (five) minutes as needed for chest pain. 25 tablet 4 unknown   Scheduled: . azithromycin  500 mg Oral Daily  . baclofen  20 mg Oral TID  . benzonatate  200 mg Oral TID  . budesonide  0.5 mg Nebulization BID  . diltiazem  120 mg Oral Daily  . enoxaparin (LOVENOX) injection  40 mg Subcutaneous Q24H  . ethambutol  1,200 mg Oral Daily  . fluticasone  2 spray Each Nare Daily  . guaiFENesin  600 mg Oral TID  . insulin aspart  0-15 Units Subcutaneous TID WC  . insulin aspart  0-5 Units Subcutaneous QHS  . insulin glargine  26 Units Subcutaneous Daily  . ipratropium-albuterol  3 mL Nebulization Once  . levalbuterol  0.63 mg  Nebulization Q6H  . loratadine  10 mg Oral Daily  . mouth rinse  15 mL Mouth Rinse BID  . montelukast  10 mg Oral QHS  . pantoprazole  40 mg Oral Daily  . polyethylene glycol  17 g Oral Daily  . predniSONE  20 mg Oral Q breakfast  . rifampin  600 mg Oral Daily  . rosuvastatin  10 mg Oral Daily  . umeclidinium-vilanterol  1 puff Inhalation Daily   Continuous: . cefTRIAXone (ROCEPHIN)  IV     RJJ:[id number], albuterol, HYDROcodone-homatropine, ketorolac, LORazepam, menthol-cetylpyridinium, morphine injection, nitroGLYCERIN, ondansetron (ZOFRAN) IV, oxyCODONE-acetaminophen **AND** oxyCODONE  Assesment: He was admitted with left-sided chest pain.  He has ruled out for acute coronary syndrome although he does have coronary disease.  He currently has flulike symptoms and he is going to need to be checked because he cannot take the flu vaccine  He has COPD which is pretty severe at baseline and that seems about the same  He has chronic hypoxic respiratory failure and remains on oxygen at about 4 L  He has chronic Mycobacterium avium disease and is on triple antibiotic therapy for that.  He has cavitary lesions on chest CT and they are approximately the same or improved compared to the last CT  He has diabetes on insulin  He has chronic diastolic heart failure and he is not having any swelling at this point. 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  Add Chloraseptic lozenges.  Because he is worse I am  going to add Rocephin.    LOS: 3 days   Mariateresa Batra L 06/23/2018, 8:29 AM

## 2018-06-24 LAB — GLUCOSE, CAPILLARY
GLUCOSE-CAPILLARY: 191 mg/dL — AB (ref 70–99)
Glucose-Capillary: 390 mg/dL — ABNORMAL HIGH (ref 70–99)
Glucose-Capillary: 176 mg/dL — ABNORMAL HIGH (ref 70–99)
Glucose-Capillary: 206 mg/dL — ABNORMAL HIGH (ref 70–99)

## 2018-06-24 MED ORDER — FUROSEMIDE 10 MG/ML IJ SOLN
40.0000 mg | Freq: Every day | INTRAMUSCULAR | Status: DC
  Administered 2018-06-24 – 2018-06-28 (×5): 40 mg via INTRAVENOUS
  Filled 2018-06-24 (×5): qty 4

## 2018-06-24 MED ORDER — POTASSIUM CHLORIDE CRYS ER 20 MEQ PO TBCR
20.0000 meq | EXTENDED_RELEASE_TABLET | Freq: Two times a day (BID) | ORAL | Status: DC
  Administered 2018-06-24 – 2018-06-28 (×9): 20 meq via ORAL
  Filled 2018-06-24 (×9): qty 1

## 2018-06-24 NOTE — Progress Notes (Signed)
Subjective: He does not feel well again today.  He says he feels like he has more trouble with his right lung.  His pain is not as well-controlled as he would like.  No other new complaints except he is starting to have some swelling of his legs  Objective: Vital signs in last 24 hours: Temp:  [97.5 F (36.4 C)-98.3 F (36.8 C)] 97.5 F (36.4 C) (12/12 0817) Pulse Rate:  [77-89] 84 (12/12 0817) Resp:  [18-20] 20 (12/12 0607) BP: (107-123)/(65-83) 119/81 (12/12 0817) SpO2:  [98 %-100 %] 98 % (12/12 0817) Weight change:  Last BM Date: 06/23/18  Intake/Output from previous day: 12/11 0701 - 12/12 0700 In: 816.5 [P.O.:720; IV Piggyback:96.5] Out: 800 [Urine:800]  PHYSICAL EXAM General appearance: alert, cooperative and mild distress Resp: rhonchi bilaterally Cardio: regular rate and rhythm, S1, S2 normal, no murmur, click, rub or gallop GI: soft, non-tender; bowel sounds normal; no masses,  no organomegaly Extremities: 1-2+ edema bilaterally  Lab Results:  Results for orders placed or performed during the hospital encounter of 06/19/18 (from the past 48 hour(s))  Glucose, capillary     Status: Abnormal   Collection Time: 06/22/18 11:29 AM  Result Value Ref Range   Glucose-Capillary 354 (H) 70 - 99 mg/dL   Comment 1 Notify RN    Comment 2 Document in Chart   Glucose, capillary     Status: Abnormal   Collection Time: 06/22/18  4:43 PM  Result Value Ref Range   Glucose-Capillary 243 (H) 70 - 99 mg/dL   Comment 1 Notify RN    Comment 2 Document in Chart   Glucose, capillary     Status: Abnormal   Collection Time: 06/22/18 10:26 PM  Result Value Ref Range   Glucose-Capillary 176 (H) 70 - 99 mg/dL  Glucose, capillary     Status: Abnormal   Collection Time: 06/23/18  7:31 AM  Result Value Ref Range   Glucose-Capillary 222 (H) 70 - 99 mg/dL  Influenza panel by PCR (type A & B)     Status: None   Collection Time: 06/23/18  8:11 AM  Result Value Ref Range   Influenza A By PCR  NEGATIVE NEGATIVE   Influenza B By PCR NEGATIVE NEGATIVE    Comment: (NOTE) The Xpert Xpress Flu assay is intended as an aid in the diagnosis of  influenza and should not be used as a sole basis for treatment.  This  assay is FDA approved for nasopharyngeal swab specimens only. Nasal  washings and aspirates are unacceptable for Xpert Xpress Flu testing. Performed at [facility], [address] [place], [place] [address]   Glucose, capillary     Status: Abnormal   Collection Time: 06/23/18 11:12 AM  Result Value Ref Range   Glucose-Capillary 284 (H) 70 - 99 mg/dL  Glucose, capillary     Status: Abnormal   Collection Time: 06/23/18  4:11 PM  Result Value Ref Range   Glucose-Capillary 212 (H) 70 - 99 mg/dL  Glucose, capillary     Status: Abnormal   Collection Time: 06/23/18  9:48 PM  Result Value Ref Range   Glucose-Capillary 163 (H) 70 - 99 mg/dL   Comment 1 Notify RN    Comment 2 Document in Chart   Glucose, capillary     Status: Abnormal   Collection Time: 06/24/18  7:32 AM  Result Value Ref Range   Glucose-Capillary 191 (H) 70 - 99 mg/dL    ABGS No results for input(s): PHART, PO2ART,  TCO2, HCO3 in the last 72 hours.  Invalid input(s): PCO2 CULTURES Recent Results (from the past 240 hour(s))  MRSA PCR Screening     Status: None   Collection Time: 06/19/18 10:06 PM  Result Value Ref Range Status   MRSA by PCR NEGATIVE NEGATIVE Final    Comment:        The GeneXpert MRSA Assay (FDA approved for NASAL specimens only), is one component of a comprehensive MRSA colonization surveillance program. It is not intended to diagnose MRSA infection nor to guide or monitor treatment for MRSA infections. Performed at [facility], [address], [place], [place] [address]    Studies/Results: No results found.  Medications:  Prior to Admission:  Medications Prior to Admission  Medication Sig Dispense Refill Last Dose  . albuterol (VENTOLIN HFA) 108 (90 BASE) MCG/ACT  inhaler Inhale 2 puffs into the lungs every 6 (six) hours as needed for wheezing or shortness of breath.   06/19/2018 at Unknown time  . azithromycin (ZITHROMAX) 500 MG tablet Take 500 mg by mouth daily.   06/19/2018 at Unknown time  . baclofen (LIORESAL) 20 MG tablet Take 20 mg by mouth 3 (three) times daily.    06/19/2018 at Unknown time  . benzonatate (TESSALON) 200 MG capsule Take 200 mg by mouth 3 (three) times daily.   06/19/2018 at Unknown time  . budesonide (PULMICORT) 0.25 MG/2ML nebulizer solution Take 2 mLs (0.25 mg total) by nebulization 2 (two) times daily. 60 mL 12 06/19/2018 at Unknown time  . CARTIA XT 120 MG 24 hr capsule Take 1 capsule (120 mg total) by mouth daily. 7 capsule 0 06/19/2018 at Unknown time  . ethambutol (MYAMBUTOL) 400 MG tablet Take 3 tablets (1,200 mg total) by mouth daily. 90 tablet 11 06/19/2018 at Unknown time  . fexofenadine (ALLEGRA) 180 MG tablet Take 180 mg by mouth daily.   06/19/2018 at Unknown time  . fluticasone (FLONASE) 50 MCG/ACT nasal spray Place 2 sprays into both nostrils daily.   5 06/19/2018 at Unknown time  . Fluticasone-Umeclidin-Vilant (TRELEGY ELLIPTA) 100-62.5-25 MCG/INH AEPB Inhale 1 puff into the lungs daily.   06/19/2018 at Unknown time  . guaiFENesin (MUCUS RELIEF) 600 MG 12 hr tablet Take 600 mg by mouth 3 (three) times daily.   06/19/2018 at Unknown time  . insulin glargine (LANTUS) 100 UNIT/ML injection Inject 0.24 mLs (24 Units total) into the skin daily. 24 units daily (Patient taking differently: Inject 26 Units into the skin daily. ) 10 mL 11 06/19/2018 at Unknown time  . insulin lispro (HUMALOG) 100 UNIT/ML injection Take by sliding scale provided by hospital maximum amount daily is 60 units.  Check blood sugar 4 times a day (Patient taking differently: Inject 0-15 Units into the skin 3 (three) times daily with meals. Take by sliding scale provided by hospital maximum amount daily is 60 units.  Check blood sugar 4 times a day) 10 mL 11 06/19/2018  at Unknown time  . ipratropium (ATROVENT) 0.02 % nebulizer solution Take 0.5 mg by nebulization 2 (two) times daily.   5 06/19/2018 at Unknown time  . levalbuterol (XOPENEX) 0.63 MG/3ML nebulizer solution Take 0.63 mg by nebulization 2 (two) times daily.   06/19/2018 at Unknown time  . LORazepam (ATIVAN) 0.5 MG tablet Take 1 tablet (0.5 mg total) by mouth 2 (two) times daily as needed for anxiety or sleep. (Patient taking differently: Take 0.5 mg by mouth 4 (four) times daily. ) 20 tablet 0 06/19/2018 at Unknown time  .  metFORMIN (GLUCOPHAGE) 500 MG tablet Take 1 tablet (500 mg total) by mouth daily with breakfast. 30 tablet 3 06/19/2018 at Unknown time  . montelukast (SINGULAIR) 10 MG tablet Take 10 mg by mouth at bedtime.   06/18/2018 at Unknown time  . omeprazole (PRILOSEC) 20 MG capsule Take 1 capsule (20 mg total) by mouth 2 (two) times daily before a meal. 60 capsule 5 06/19/2018 at Unknown time  . oxyCODONE-acetaminophen (PERCOCET) 10-325 MG tablet Take 1 tablet by mouth every 4 (four) hours as needed for pain.   06/19/2018 at Unknown time  . predniSONE (DELTASONE) 10 MG tablet Take 2 daily for 1 week then 1 daily (Patient taking differently: Take 20 mg by mouth daily with breakfast. ) 60 tablet 5 06/18/2018 at Unknown time  . rifampin (RIFADIN) 300 MG capsule Take 2 capsules (600 mg total) by mouth daily. 60 capsule 11 06/19/2018 at    . rosuvastatin (CRESTOR) 10 MG tablet Take 10 mg by mouth daily.   11 06/19/2018 at Unknown time  . HYDROcodone-homatropine (HYCODAN) 5-1.5 MG/5ML syrup Take 5 mLs by mouth every 4 (four) hours as needed for cough. (Patient not taking: Reported on 06/19/2018) 120 mL 0 Not Taking at Unknown time  . nitroGLYCERIN (NITROSTAT) 0.4 MG SL tablet Place 1 tablet (0.4 mg total) under the tongue every 5 (five) minutes as needed for chest pain. 25 tablet 4 unknown   Scheduled: . azithromycin  500 mg Oral Daily  . baclofen  20 mg Oral TID  . benzonatate  200 mg Oral TID  .  budesonide  0.5 mg Nebulization BID  . diltiazem  120 mg Oral Daily  . enoxaparin (LOVENOX) injection  40 mg Subcutaneous Q24H  . ethambutol  1,200 mg Oral Daily  . fluticasone  2 spray Each Nare Daily  . furosemide  40 mg Intravenous Daily  . guaiFENesin  600 mg Oral TID  . insulin aspart  0-15 Units Subcutaneous TID WC  . insulin aspart  0-5 Units Subcutaneous QHS  . insulin glargine  26 Units Subcutaneous Daily  . ipratropium-albuterol  3 mL Nebulization Once  . levalbuterol  0.63 mg Nebulization Q6H  . loratadine  10 mg Oral Daily  . mouth rinse  15 mL Mouth Rinse BID  . montelukast  10 mg Oral QHS  . pantoprazole  40 mg Oral Daily  . polyethylene glycol  17 g Oral Daily  . potassium chloride  20 mEq Oral BID  . predniSONE  20 mg Oral Q breakfast  . rifampin  600 mg Oral Daily  . rosuvastatin  10 mg Oral Daily  . umeclidinium-vilanterol  1 puff Inhalation Daily   Continuous: . cefTRIAXone (ROCEPHIN)  IV Stopped (06/23/18 1033)   DGU:[id number], albuterol, HYDROcodone-homatropine, ketorolac, LORazepam, menthol-cetylpyridinium, morphine injection, nitroGLYCERIN, ondansetron (ZOFRAN) IV, oxyCODONE-acetaminophen **AND** oxyCODONE  Assesment: He was admitted with chest pain and ruled out for acute coronary syndrome.  He has COPD exacerbation and is having more trouble.  He has chronic hypoxic respiratory failure  He has diabetes pretty well controlled.  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  Continue everything else.  Not ready for  discharge.    LOS: 4 days   Ryan [name] 06/24/2018, 9:21 AM

## 2018-06-24 NOTE — Care Management Note (Signed)
Case Management Note  Patient Details  Name: Ryan [name] MRN: [id number] Date of Birth: 12/21/1960  If discussed at Long Length of Stay Meetings, dates discussed:  06/24/18  Additional Comments:  Sherald Barge, RN 06/24/2018, 12:07 PM

## 2018-06-25 LAB — GLUCOSE, CAPILLARY
Glucose-Capillary: 243 mg/dL — ABNORMAL HIGH (ref 70–99)
Glucose-Capillary: 441 mg/dL — ABNORMAL HIGH (ref 70–99)
Glucose-Capillary: 119 mg/dL — ABNORMAL HIGH (ref 70–99)
Glucose-Capillary: 248 mg/dL — ABNORMAL HIGH (ref 70–99)

## 2018-06-25 MED ORDER — INSULIN ASPART 100 UNIT/ML ~~LOC~~ SOLN
20.0000 [IU] | Freq: Once | SUBCUTANEOUS | Status: AC
  Administered 2018-06-25: 20 [IU] via SUBCUTANEOUS

## 2018-06-25 NOTE — Care Management Important Message (Signed)
Important Message  Patient Details  Name: Ryan [name] MRN: [id number] Date of Birth: 03-Mar-1961   Medicare Important Message Given:  Yes    [name] 06/25/2018, 1:55 PM

## 2018-06-25 NOTE — Progress Notes (Signed)
Subjective: He was admitted with left-sided chest pain but ruled out for acute coronary syndrome.  He is now having more right-sided chest pain.  He was more short of breath yesterday but that seems a little better.  His pain is not very well controlled.  He developed some acute on chronic diastolic heart failure yesterday and his swelling is better.  Objective: Vital signs in last 24 hours: Temp:  [97.8 F (36.6 C)-97.9 F (36.6 C)] 97.8 F (36.6 C) (12/12 2125) Pulse Rate:  [81-88] 85 (12/13 0531) Resp:  [20] 20 (12/13 0531) BP: (119-140)/(71-87) 120/71 (12/13 0531) SpO2:  [93 %-100 %] 93 % (12/13 0817) Weight change:  Last BM Date: 06/24/18  Intake/Output from previous day: 12/12 0701 - 12/13 0700 In: 720 [P.O.:720] Out: 1470 [Urine:1470]  PHYSICAL EXAM General appearance: alert, cooperative and no distress Resp: rhonchi bilaterally Cardio: regular rate and rhythm, S1, S2 normal, no murmur, click, rub or gallop GI: soft, non-tender; bowel sounds normal; no masses,  no organomegaly Extremities: extremities normal, atraumatic, no cyanosis or edema  Lab Results:  Results for orders placed or performed during the hospital encounter of 06/19/18 (from the past 48 hour(s))  Glucose, capillary     Status: Abnormal   Collection Time: 06/23/18 11:12 AM  Result Value Ref Range   Glucose-Capillary 284 (H) 70 - 99 mg/dL  Glucose, capillary     Status: Abnormal   Collection Time: 06/23/18  4:11 PM  Result Value Ref Range   Glucose-Capillary 212 (H) 70 - 99 mg/dL  Glucose, capillary     Status: Abnormal   Collection Time: 06/23/18  9:48 PM  Result Value Ref Range   Glucose-Capillary 163 (H) 70 - 99 mg/dL   Comment 1 Notify RN    Comment 2 Document in Chart   Glucose, capillary     Status: Abnormal   Collection Time: 06/24/18  7:32 AM  Result Value Ref Range   Glucose-Capillary 191 (H) 70 - 99 mg/dL  Glucose, capillary     Status: Abnormal   Collection Time: 06/24/18 11:19 AM   Result Value Ref Range   Glucose-Capillary 390 (H) 70 - 99 mg/dL  Glucose, capillary     Status: Abnormal   Collection Time: 06/24/18  4:17 PM  Result Value Ref Range   Glucose-Capillary 176 (H) 70 - 99 mg/dL  Glucose, capillary     Status: Abnormal   Collection Time: 06/24/18  9:27 PM  Result Value Ref Range   Glucose-Capillary 206 (H) 70 - 99 mg/dL   Comment 1 Notify RN    Comment 2 Document in Chart   Glucose, capillary     Status: Abnormal   Collection Time: 06/25/18  7:45 AM  Result Value Ref Range   Glucose-Capillary 243 (H) 70 - 99 mg/dL    ABGS No results for input(s): PHART, PO2ART, TCO2, HCO3 in the last 72 hours.  Invalid input(s): PCO2 CULTURES Recent Results (from the past 240 hour(s))  MRSA PCR Screening     Status: None   Collection Time: 06/19/18 10:06 PM  Result Value Ref Range Status   MRSA by PCR NEGATIVE NEGATIVE Final    Comment:        The GeneXpert MRSA Assay (FDA approved for NASAL specimens only), is one component of a comprehensive MRSA colonization surveillance program. It is not intended to diagnose MRSA infection nor to guide or monitor treatment for MRSA infections. Performed at [facility], [address] [place], [place] [address]  Studies/Results: No results found.  Medications:  Prior to Admission:  Medications Prior to Admission  Medication Sig Dispense Refill Last Dose  . albuterol (VENTOLIN HFA) 108 (90 BASE) MCG/ACT inhaler Inhale 2 puffs into the lungs every 6 (six) hours as needed for wheezing or shortness of breath.   06/19/2018 at Unknown time  . azithromycin (ZITHROMAX) 500 MG tablet Take 500 mg by mouth daily.   06/19/2018 at Unknown time  . baclofen (LIORESAL) 20 MG tablet Take 20 mg by mouth 3 (three) times daily.    06/19/2018 at Unknown time  . benzonatate (TESSALON) 200 MG capsule Take 200 mg by mouth 3 (three) times daily.   06/19/2018 at Unknown time  . budesonide (PULMICORT) 0.25 MG/2ML nebulizer solution Take 2  mLs (0.25 mg total) by nebulization 2 (two) times daily. 60 mL 12 06/19/2018 at Unknown time  . CARTIA XT 120 MG 24 hr capsule Take 1 capsule (120 mg total) by mouth daily. 7 capsule 0 06/19/2018 at Unknown time  . ethambutol (MYAMBUTOL) 400 MG tablet Take 3 tablets (1,200 mg total) by mouth daily. 90 tablet 11 06/19/2018 at Unknown time  . fexofenadine (ALLEGRA) 180 MG tablet Take 180 mg by mouth daily.   06/19/2018 at Unknown time  . fluticasone (FLONASE) 50 MCG/ACT nasal spray Place 2 sprays into both nostrils daily.   5 06/19/2018 at Unknown time  . Fluticasone-Umeclidin-Vilant (TRELEGY ELLIPTA) 100-62.5-25 MCG/INH AEPB Inhale 1 puff into the lungs daily.   06/19/2018 at Unknown time  . guaiFENesin (MUCUS RELIEF) 600 MG 12 hr tablet Take 600 mg by mouth 3 (three) times daily.   06/19/2018 at Unknown time  . insulin glargine (LANTUS) 100 UNIT/ML injection Inject 0.24 mLs (24 Units total) into the skin daily. 24 units daily (Patient taking differently: Inject 26 Units into the skin daily. ) 10 mL 11 06/19/2018 at Unknown time  . insulin lispro (HUMALOG) 100 UNIT/ML injection Take by sliding scale provided by hospital maximum amount daily is 60 units.  Check blood sugar 4 times a day (Patient taking differently: Inject 0-15 Units into the skin 3 (three) times daily with meals. Take by sliding scale provided by hospital maximum amount daily is 60 units.  Check blood sugar 4 times a day) 10 mL 11 06/19/2018 at Unknown time  . ipratropium (ATROVENT) 0.02 % nebulizer solution Take 0.5 mg by nebulization 2 (two) times daily.   5 06/19/2018 at Unknown time  . levalbuterol (XOPENEX) 0.63 MG/3ML nebulizer solution Take 0.63 mg by nebulization 2 (two) times daily.   06/19/2018 at Unknown time  . LORazepam (ATIVAN) 0.5 MG tablet Take 1 tablet (0.5 mg total) by mouth 2 (two) times daily as needed for anxiety or sleep. (Patient taking differently: Take 0.5 mg by mouth 4 (four) times daily. ) 20 tablet 0 06/19/2018 at Unknown  time  . metFORMIN (GLUCOPHAGE) 500 MG tablet Take 1 tablet (500 mg total) by mouth daily with breakfast. 30 tablet 3 06/19/2018 at Unknown time  . montelukast (SINGULAIR) 10 MG tablet Take 10 mg by mouth at bedtime.   06/18/2018 at Unknown time  . omeprazole (PRILOSEC) 20 MG capsule Take 1 capsule (20 mg total) by mouth 2 (two) times daily before a meal. 60 capsule 5 06/19/2018 at Unknown time  . oxyCODONE-acetaminophen (PERCOCET) 10-325 MG tablet Take 1 tablet by mouth every 4 (four) hours as needed for pain.   06/19/2018 at Unknown time  . predniSONE (DELTASONE) 10 MG tablet Take 2 daily for 1 week  then 1 daily (Patient taking differently: Take 20 mg by mouth daily with breakfast. ) 60 tablet 5 06/18/2018 at Unknown time  . rifampin (RIFADIN) 300 MG capsule Take 2 capsules (600 mg total) by mouth daily. 60 capsule 11 06/19/2018 at    . rosuvastatin (CRESTOR) 10 MG tablet Take 10 mg by mouth daily.   11 06/19/2018 at Unknown time  . HYDROcodone-homatropine (HYCODAN) 5-1.5 MG/5ML syrup Take 5 mLs by mouth every 4 (four) hours as needed for cough. (Patient not taking: Reported on 06/19/2018) 120 mL 0 Not Taking at Unknown time  . nitroGLYCERIN (NITROSTAT) 0.4 MG SL tablet Place 1 tablet (0.4 mg total) under the tongue every 5 (five) minutes as needed for chest pain. 25 tablet 4 unknown   Scheduled: . azithromycin  500 mg Oral Daily  . baclofen  20 mg Oral TID  . benzonatate  200 mg Oral TID  . budesonide  0.5 mg Nebulization BID  . diltiazem  120 mg Oral Daily  . enoxaparin (LOVENOX) injection  40 mg Subcutaneous Q24H  . ethambutol  1,200 mg Oral Daily  . fluticasone  2 spray Each Nare Daily  . furosemide  40 mg Intravenous Daily  . guaiFENesin  600 mg Oral TID  . insulin aspart  0-15 Units Subcutaneous TID WC  . insulin aspart  0-5 Units Subcutaneous QHS  . insulin glargine  26 Units Subcutaneous Daily  . ipratropium-albuterol  3 mL Nebulization Once  . levalbuterol  0.63 mg Nebulization Q6H  .  loratadine  10 mg Oral Daily  . mouth rinse  15 mL Mouth Rinse BID  . montelukast  10 mg Oral QHS  . pantoprazole  40 mg Oral Daily  . polyethylene glycol  17 g Oral Daily  . potassium chloride  20 mEq Oral BID  . predniSONE  20 mg Oral Q breakfast  . rifampin  600 mg Oral Daily  . rosuvastatin  10 mg Oral Daily  . umeclidinium-vilanterol  1 puff Inhalation Daily   Continuous: . cefTRIAXone (ROCEPHIN)  IV 1 g (06/24/18 1054)   HER:[id number], albuterol, HYDROcodone-homatropine, LORazepam, menthol-cetylpyridinium, morphine injection, nitroGLYCERIN, ondansetron (ZOFRAN) IV, oxyCODONE-acetaminophen **AND** oxyCODONE  Assesment: He was admitted with chest discomfort but ruled out for acute coronary syndrome.  He has COPD at baseline and is having an exacerbation.  He has acute on chronic diastolic heart failure and is on Lasix  He has chronic hypoxic respiratory failure with acute exacerbation.  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

## 2018-06-26 LAB — GLUCOSE, CAPILLARY
Glucose-Capillary: 265 mg/dL — ABNORMAL HIGH (ref 70–99)
Glucose-Capillary: 381 mg/dL — ABNORMAL HIGH (ref 70–99)
Glucose-Capillary: 299 mg/dL — ABNORMAL HIGH (ref 70–99)
Glucose-Capillary: 249 mg/dL — ABNORMAL HIGH (ref 70–99)

## 2018-06-26 NOTE — Progress Notes (Signed)
Subjective: He says he feels some better.  He is still short of breath.  He still has some chest discomfort but it is better.  Objective: Vital signs in last 24 hours: Temp:  [97.7 F (36.5 C)-98.6 F (37 C)] 98.2 F (36.8 C) (12/14 0941) Pulse Rate:  [77-99] 90 (12/14 0941) Resp:  [18] 18 (12/14 0546) BP: (119-133)/(72-95) 119/72 (12/14 0941) SpO2:  [96 %-100 %] 98 % (12/14 0941) Weight change:  Last BM Date: 06/25/18  Intake/Output from previous day: 12/13 0701 - 12/14 0700 In: 240 [P.O.:240] Out: 1400 [Urine:1400]  PHYSICAL EXAM General appearance: alert, cooperative and no distress Resp: rhonchi bilaterally Cardio: regular rate and rhythm, S1, S2 normal, no murmur, click, rub or gallop GI: soft, non-tender; bowel sounds normal; no masses,  no organomegaly Extremities: extremities normal, atraumatic, no cyanosis or edema  Lab Results:  Results for orders placed or performed during the hospital encounter of 06/19/18 (from the past 48 hour(s))  Glucose, capillary     Status: Abnormal   Collection Time: 06/24/18 11:19 AM  Result Value Ref Range   Glucose-Capillary 390 (H) 70 - 99 mg/dL  Glucose, capillary     Status: Abnormal   Collection Time: 06/24/18  4:17 PM  Result Value Ref Range   Glucose-Capillary 176 (H) 70 - 99 mg/dL  Glucose, capillary     Status: Abnormal   Collection Time: 06/24/18  9:27 PM  Result Value Ref Range   Glucose-Capillary 206 (H) 70 - 99 mg/dL   Comment 1 Notify RN    Comment 2 Document in Chart   Glucose, capillary     Status: Abnormal   Collection Time: 06/25/18  7:45 AM  Result Value Ref Range   Glucose-Capillary 243 (H) 70 - 99 mg/dL  Glucose, capillary     Status: Abnormal   Collection Time: 06/25/18 11:12 AM  Result Value Ref Range   Glucose-Capillary 441 (H) 70 - 99 mg/dL  Glucose, capillary     Status: Abnormal   Collection Time: 06/25/18  4:33 PM  Result Value Ref Range   Glucose-Capillary 119 (H) 70 - 99 mg/dL  Glucose,  capillary     Status: Abnormal   Collection Time: 06/25/18  9:52 PM  Result Value Ref Range   Glucose-Capillary 248 (H) 70 - 99 mg/dL   Comment 1 Notify RN    Comment 2 Document in Chart   Glucose, capillary     Status: Abnormal   Collection Time: 06/26/18  7:31 AM  Result Value Ref Range   Glucose-Capillary 265 (H) 70 - 99 mg/dL    ABGS No results for input(s): PHART, PO2ART, TCO2, HCO3 in the last 72 hours.  Invalid input(s): PCO2 CULTURES Recent Results (from the past 240 hour(s))  MRSA PCR Screening     Status: None   Collection Time: 06/19/18 10:06 PM  Result Value Ref Range Status   MRSA by PCR NEGATIVE NEGATIVE Final    Comment:        The GeneXpert MRSA Assay (FDA approved for NASAL specimens only), is one component of a comprehensive MRSA colonization surveillance program. It is not intended to diagnose MRSA infection nor to guide or monitor treatment for MRSA infections. Performed at [facility], [address] [place], [place] [address]    Studies/Results: No results found.  Medications:  Prior to Admission:  Medications Prior to Admission  Medication Sig Dispense Refill Last Dose  . albuterol (VENTOLIN HFA) 108 (90 BASE) MCG/ACT inhaler Inhale 2 puffs into  the lungs every 6 (six) hours as needed for wheezing or shortness of breath.   06/19/2018 at Unknown time  . azithromycin (ZITHROMAX) 500 MG tablet Take 500 mg by mouth daily.   06/19/2018 at Unknown time  . baclofen (LIORESAL) 20 MG tablet Take 20 mg by mouth 3 (three) times daily.    06/19/2018 at Unknown time  . benzonatate (TESSALON) 200 MG capsule Take 200 mg by mouth 3 (three) times daily.   06/19/2018 at Unknown time  . budesonide (PULMICORT) 0.25 MG/2ML nebulizer solution Take 2 mLs (0.25 mg total) by nebulization 2 (two) times daily. 60 mL 12 06/19/2018 at Unknown time  . CARTIA XT 120 MG 24 hr capsule Take 1 capsule (120 mg total) by mouth daily. 7 capsule 0 06/19/2018 at Unknown time  . ethambutol  (MYAMBUTOL) 400 MG tablet Take 3 tablets (1,200 mg total) by mouth daily. 90 tablet 11 06/19/2018 at Unknown time  . fexofenadine (ALLEGRA) 180 MG tablet Take 180 mg by mouth daily.   06/19/2018 at Unknown time  . fluticasone (FLONASE) 50 MCG/ACT nasal spray Place 2 sprays into both nostrils daily.   5 06/19/2018 at Unknown time  . Fluticasone-Umeclidin-Vilant (TRELEGY ELLIPTA) 100-62.5-25 MCG/INH AEPB Inhale 1 puff into the lungs daily.   06/19/2018 at Unknown time  . guaiFENesin (MUCUS RELIEF) 600 MG 12 hr tablet Take 600 mg by mouth 3 (three) times daily.   06/19/2018 at Unknown time  . insulin glargine (LANTUS) 100 UNIT/ML injection Inject 0.24 mLs (24 Units total) into the skin daily. 24 units daily (Patient taking differently: Inject 26 Units into the skin daily. ) 10 mL 11 06/19/2018 at Unknown time  . insulin lispro (HUMALOG) 100 UNIT/ML injection Take by sliding scale provided by hospital maximum amount daily is 60 units.  Check blood sugar 4 times a day (Patient taking differently: Inject 0-15 Units into the skin 3 (three) times daily with meals. Take by sliding scale provided by hospital maximum amount daily is 60 units.  Check blood sugar 4 times a day) 10 mL 11 06/19/2018 at Unknown time  . ipratropium (ATROVENT) 0.02 % nebulizer solution Take 0.5 mg by nebulization 2 (two) times daily.   5 06/19/2018 at Unknown time  . levalbuterol (XOPENEX) 0.63 MG/3ML nebulizer solution Take 0.63 mg by nebulization 2 (two) times daily.   06/19/2018 at Unknown time  . LORazepam (ATIVAN) 0.5 MG tablet Take 1 tablet (0.5 mg total) by mouth 2 (two) times daily as needed for anxiety or sleep. (Patient taking differently: Take 0.5 mg by mouth 4 (four) times daily. ) 20 tablet 0 06/19/2018 at Unknown time  . metFORMIN (GLUCOPHAGE) 500 MG tablet Take 1 tablet (500 mg total) by mouth daily with breakfast. 30 tablet 3 06/19/2018 at Unknown time  . montelukast (SINGULAIR) 10 MG tablet Take 10 mg by mouth at bedtime.   06/18/2018  at Unknown time  . omeprazole (PRILOSEC) 20 MG capsule Take 1 capsule (20 mg total) by mouth 2 (two) times daily before a meal. 60 capsule 5 06/19/2018 at Unknown time  . oxyCODONE-acetaminophen (PERCOCET) 10-325 MG tablet Take 1 tablet by mouth every 4 (four) hours as needed for pain.   06/19/2018 at Unknown time  . predniSONE (DELTASONE) 10 MG tablet Take 2 daily for 1 week then 1 daily (Patient taking differently: Take 20 mg by mouth daily with breakfast. ) 60 tablet 5 06/18/2018 at Unknown time  . rifampin (RIFADIN) 300 MG capsule Take 2 capsules (600 mg total) by  mouth daily. 60 capsule 11 06/19/2018 at    . rosuvastatin (CRESTOR) 10 MG tablet Take 10 mg by mouth daily.   11 06/19/2018 at Unknown time  . HYDROcodone-homatropine (HYCODAN) 5-1.5 MG/5ML syrup Take 5 mLs by mouth every 4 (four) hours as needed for cough. (Patient not taking: Reported on 06/19/2018) 120 mL 0 Not Taking at Unknown time  . nitroGLYCERIN (NITROSTAT) 0.4 MG SL tablet Place 1 tablet (0.4 mg total) under the tongue every 5 (five) minutes as needed for chest pain. 25 tablet 4 unknown   Scheduled: . azithromycin  500 mg Oral Daily  . baclofen  20 mg Oral TID  . benzonatate  200 mg Oral TID  . budesonide  0.5 mg Nebulization BID  . diltiazem  120 mg Oral Daily  . enoxaparin (LOVENOX) injection  40 mg Subcutaneous Q24H  . ethambutol  1,200 mg Oral Daily  . fluticasone  2 spray Each Nare Daily  . furosemide  40 mg Intravenous Daily  . guaiFENesin  600 mg Oral TID  . insulin aspart  0-15 Units Subcutaneous TID WC  . insulin aspart  0-5 Units Subcutaneous QHS  . insulin glargine  26 Units Subcutaneous Daily  . ipratropium-albuterol  3 mL Nebulization Once  . levalbuterol  0.63 mg Nebulization Q6H  . loratadine  10 mg Oral Daily  . mouth rinse  15 mL Mouth Rinse BID  . montelukast  10 mg Oral QHS  . pantoprazole  40 mg Oral Daily  . polyethylene glycol  17 g Oral Daily  . potassium chloride  20 mEq Oral BID  . predniSONE   20 mg Oral Q breakfast  . rifampin  600 mg Oral Daily  . rosuvastatin  10 mg Oral Daily  . umeclidinium-vilanterol  1 puff Inhalation Daily   Continuous: . cefTRIAXone (ROCEPHIN)  IV 1 g (06/26/18 2706)   CBJ:[id number], albuterol, HYDROcodone-homatropine, LORazepam, menthol-cetylpyridinium, morphine injection, nitroGLYCERIN, ondansetron (ZOFRAN) IV, oxyCODONE-acetaminophen **AND** oxyCODONE  Assesment: He was admitted with left-sided chest pain and that has resolved.  He has developed increasing shortness of breath with COPD exacerbation and that is slowly improving.  He has acute on chronic hypoxic respiratory failure which is about the same.  He is known to have cardiac disease but does not appear to be having any sort of acute coronary syndrome now.  He has diabetes which is stable.  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  Continue other treatments.  Recheck blood in the morning.    LOS: 6 days   Shirle Provencal L 06/26/2018, 9:47 AM

## 2018-06-27 LAB — CBC WITH DIFFERENTIAL/PLATELET
Abs Immature Granulocytes: 0.03 10*3/uL (ref 0.00–0.07)
Basophils Absolute: 0.1 10*3/uL (ref 0.0–0.1)
Basophils Relative: 1 %
Eosinophils Absolute: 0.3 10*3/uL (ref 0.0–0.5)
Eosinophils Relative: 5 %
HCT: 33.8 % — ABNORMAL LOW (ref 39.0–52.0)
Hemoglobin: 10.6 g/dL — ABNORMAL LOW (ref 13.0–17.0)
Immature Granulocytes: 1 %
Lymphocytes Relative: 33 %
Lymphs Abs: 1.7 10*3/uL (ref 0.7–4.0)
MCH: 27.7 pg (ref 26.0–34.0)
MCHC: 31.4 g/dL (ref 30.0–36.0)
MCV: 88.5 fL (ref 80.0–100.0)
Monocytes Absolute: 0.8 10*3/uL (ref 0.1–1.0)
Monocytes Relative: 15 %
Neutro Abs: 2.4 10*3/uL (ref 1.7–7.7)
Neutrophils Relative %: 45 %
Platelets: 159 10*3/uL (ref 150–400)
RBC: 3.82 MIL/uL — AB (ref 4.22–5.81)
RDW: 13.3 % (ref 11.5–15.5)
WBC: 5.2 10*3/uL (ref 4.0–10.5)
nRBC: 0 % (ref 0.0–0.2)

## 2018-06-27 LAB — COMPREHENSIVE METABOLIC PANEL
ALT: 23 U/L (ref 0–44)
ANION GAP: 8 (ref 5–15)
AST: 14 U/L — ABNORMAL LOW (ref 15–41)
Albumin: 3.3 g/dL — ABNORMAL LOW (ref 3.5–5.0)
Alkaline Phosphatase: 45 U/L (ref 38–126)
BUN: 13 mg/dL (ref 6–20)
CO2: 30 mmol/L (ref 22–32)
Calcium: 8.2 mg/dL — ABNORMAL LOW (ref 8.9–10.3)
Chloride: 97 mmol/L — ABNORMAL LOW (ref 98–111)
Creatinine, Ser: 0.94 mg/dL (ref 0.61–1.24)
GFR calc Af Amer: >60 mL/min (ref >60)
GFR calc non Af Amer: >60 mL/min (ref >60)
Glucose, Bld: 275 mg/dL — ABNORMAL HIGH (ref 70–99)
POTASSIUM: 4.3 mmol/L (ref 3.5–5.1)
Sodium: 135 mmol/L (ref 135–145)
Total Bilirubin: 0.4 mg/dL (ref 0.3–1.2)
Total Protein: 6.4 g/dL — ABNORMAL LOW (ref 6.5–8.1)

## 2018-06-27 LAB — GLUCOSE, CAPILLARY
Glucose-Capillary: 247 mg/dL — ABNORMAL HIGH (ref 70–99)
Glucose-Capillary: 225 mg/dL — ABNORMAL HIGH (ref 70–99)

## 2018-06-27 NOTE — Progress Notes (Signed)
Subjective: He still has shortness of breath.  He is still coughing significantly.  He complains of pain on the right side of his chest now.  He feels nauseated  Objective: Vital signs in last 24 hours: Temp:  [97.4 F (36.3 C)-98.2 F (36.8 C)] 97.6 F (36.4 C) (12/15 0831) Pulse Rate:  [78-95] 83 (12/15 0831) Resp:  [18] 18 (12/15 0505) BP: (116-126)/(70-83) 117/70 (12/15 0831) SpO2:  [96 %-100 %] 100 % (12/15 0831) Weight change:  Last BM Date: 06/25/18  Intake/Output from previous day: 12/14 0701 - 12/15 0700 In: 240 [P.O.:240] Out: 2300 [Urine:2300]  PHYSICAL EXAM General appearance: alert, cooperative and mild distress Resp: rhonchi bilaterally Cardio: regular rate and rhythm, S1, S2 normal, no murmur, click, rub or gallop GI: soft, non-tender; bowel sounds normal; no masses,  no organomegaly Extremities: extremities normal, atraumatic, no cyanosis or edema  Lab Results:  Results for orders placed or performed during the hospital encounter of 06/19/18 (from the past 48 hour(s))  Glucose, capillary     Status: Abnormal   Collection Time: 06/25/18 11:12 AM  Result Value Ref Range   Glucose-Capillary 441 (H) 70 - 99 mg/dL  Glucose, capillary     Status: Abnormal   Collection Time: 06/25/18  4:33 PM  Result Value Ref Range   Glucose-Capillary 119 (H) 70 - 99 mg/dL  Glucose, capillary     Status: Abnormal   Collection Time: 06/25/18  9:52 PM  Result Value Ref Range   Glucose-Capillary 248 (H) 70 - 99 mg/dL   Comment 1 Notify RN    Comment 2 Document in Chart   Glucose, capillary     Status: Abnormal   Collection Time: 06/26/18  7:31 AM  Result Value Ref Range   Glucose-Capillary 265 (H) 70 - 99 mg/dL  Glucose, capillary     Status: Abnormal   Collection Time: 06/26/18 11:23 AM  Result Value Ref Range   Glucose-Capillary 381 (H) 70 - 99 mg/dL  Glucose, capillary     Status: Abnormal   Collection Time: 06/26/18  4:49 PM  Result Value Ref Range    Glucose-Capillary 299 (H) 70 - 99 mg/dL  Glucose, capillary     Status: Abnormal   Collection Time: 06/26/18  9:29 PM  Result Value Ref Range   Glucose-Capillary 249 (H) 70 - 99 mg/dL   Comment 1 Notify RN    Comment 2 Document in Chart   CBC with Differential/Platelet     Status: Abnormal   Collection Time: 06/27/18  7:14 AM  Result Value Ref Range   WBC 5.2 4.0 - 10.5 K/uL   RBC 3.82 (L) 4.22 - 5.81 MIL/uL   Hemoglobin 10.6 (L) 13.0 - 17.0 g/dL   HCT 33.8 (L) 39.0 - 52.0 %   MCV 88.5 80.0 - 100.0 fL   MCH 27.7 26.0 - 34.0 pg   MCHC 31.4 30.0 - 36.0 g/dL   RDW 13.3 11.5 - 15.5 %   Platelets 159 150 - 400 K/uL   nRBC 0.0 0.0 - 0.2 %   Neutrophils Relative % 45 %   Neutro Abs 2.4 1.7 - 7.7 K/uL   Lymphocytes Relative 33 %   Lymphs Abs 1.7 0.7 - 4.0 K/uL   Monocytes Relative 15 %   Monocytes Absolute 0.8 0.1 - 1.0 K/uL   Eosinophils Relative 5 %   Eosinophils Absolute 0.3 0.0 - 0.5 K/uL   Basophils Relative 1 %   Basophils Absolute 0.1 0.0 - 0.1 K/uL  Immature Granulocytes 1 %   Abs Immature Granulocytes 0.03 0.00 - 0.07 K/uL    Comment: Performed at [facility], [address] [place], [place] [address]  Comprehensive metabolic panel     Status: Abnormal   Collection Time: 06/27/18  7:14 AM  Result Value Ref Range   Sodium 135 135 - 145 mmol/L   Potassium 4.3 3.5 - 5.1 mmol/L   Chloride 97 (L) 98 - 111 mmol/L   CO2 30 22 - 32 mmol/L   Glucose, Bld 275 (H) 70 - 99 mg/dL   BUN 13 6 - 20 mg/dL   Creatinine, Ser 0.94 0.61 - 1.24 mg/dL   Calcium 8.2 (L) 8.9 - 10.3 mg/dL   Total Protein 6.4 (L) 6.5 - 8.1 g/dL   Albumin 3.3 (L) 3.5 - 5.0 g/dL   AST 14 (L) 15 - 41 U/L   ALT 23 0 - 44 U/L   Alkaline Phosphatase 45 38 - 126 U/L   Total Bilirubin 0.4 0.3 - 1.2 mg/dL   GFR calc non Af Amer >60 >60 mL/min   GFR calc Af Amer >60 >60 mL/min   Anion gap 8 5 - 15    Comment: Performed at [facility], [address], [place], [place] [address]  Glucose, capillary     Status:  Abnormal   Collection Time: 06/27/18  7:34 AM  Result Value Ref Range   Glucose-Capillary 247 (H) 70 - 99 mg/dL    ABGS No results for input(s): PHART, PO2ART, TCO2, HCO3 in the last 72 hours.  Invalid input(s): PCO2 CULTURES Recent Results (from the past 240 hour(s))  MRSA PCR Screening     Status: None   Collection Time: 06/19/18 10:06 PM  Result Value Ref Range Status   MRSA by PCR NEGATIVE NEGATIVE Final    Comment:        The GeneXpert MRSA Assay (FDA approved for NASAL specimens only), is one component of a comprehensive MRSA colonization surveillance program. It is not intended to diagnose MRSA infection nor to guide or monitor treatment for MRSA infections. Performed at [facility], [address], [place], [place] [address]    Studies/Results: No results found.  Medications:  Prior to Admission:  Medications Prior to Admission  Medication Sig Dispense Refill Last Dose  . albuterol (VENTOLIN HFA) 108 (90 BASE) MCG/ACT inhaler Inhale 2 puffs into the lungs every 6 (six) hours as needed for wheezing or shortness of breath.   06/19/2018 at Unknown time  . azithromycin (ZITHROMAX) 500 MG tablet Take 500 mg by mouth daily.   06/19/2018 at Unknown time  . baclofen (LIORESAL) 20 MG tablet Take 20 mg by mouth 3 (three) times daily.    06/19/2018 at Unknown time  . benzonatate (TESSALON) 200 MG capsule Take 200 mg by mouth 3 (three) times daily.   06/19/2018 at Unknown time  . budesonide (PULMICORT) 0.25 MG/2ML nebulizer solution Take 2 mLs (0.25 mg total) by nebulization 2 (two) times daily. 60 mL 12 06/19/2018 at Unknown time  . CARTIA XT 120 MG 24 hr capsule Take 1 capsule (120 mg total) by mouth daily. 7 capsule 0 06/19/2018 at Unknown time  . ethambutol (MYAMBUTOL) 400 MG tablet Take 3 tablets (1,200 mg total) by mouth daily. 90 tablet 11 06/19/2018 at Unknown time  . fexofenadine (ALLEGRA) 180 MG tablet Take 180 mg by mouth daily.   06/19/2018 at Unknown time  . fluticasone  (FLONASE) 50 MCG/ACT nasal spray Place 2 sprays into both nostrils  daily.   5 06/19/2018 at Unknown time  . Fluticasone-Umeclidin-Vilant (TRELEGY ELLIPTA) 100-62.5-25 MCG/INH AEPB Inhale 1 puff into the lungs daily.   06/19/2018 at Unknown time  . guaiFENesin (MUCUS RELIEF) 600 MG 12 hr tablet Take 600 mg by mouth 3 (three) times daily.   06/19/2018 at Unknown time  . insulin glargine (LANTUS) 100 UNIT/ML injection Inject 0.24 mLs (24 Units total) into the skin daily. 24 units daily (Patient taking differently: Inject 26 Units into the skin daily. ) 10 mL 11 06/19/2018 at Unknown time  . insulin lispro (HUMALOG) 100 UNIT/ML injection Take by sliding scale provided by hospital maximum amount daily is 60 units.  Check blood sugar 4 times a day (Patient taking differently: Inject 0-15 Units into the skin 3 (three) times daily with meals. Take by sliding scale provided by hospital maximum amount daily is 60 units.  Check blood sugar 4 times a day) 10 mL 11 06/19/2018 at Unknown time  . ipratropium (ATROVENT) 0.02 % nebulizer solution Take 0.5 mg by nebulization 2 (two) times daily.   5 06/19/2018 at Unknown time  . levalbuterol (XOPENEX) 0.63 MG/3ML nebulizer solution Take 0.63 mg by nebulization 2 (two) times daily.   06/19/2018 at Unknown time  . LORazepam (ATIVAN) 0.5 MG tablet Take 1 tablet (0.5 mg total) by mouth 2 (two) times daily as needed for anxiety or sleep. (Patient taking differently: Take 0.5 mg by mouth 4 (four) times daily. ) 20 tablet 0 06/19/2018 at Unknown time  . metFORMIN (GLUCOPHAGE) 500 MG tablet Take 1 tablet (500 mg total) by mouth daily with breakfast. 30 tablet 3 06/19/2018 at Unknown time  . montelukast (SINGULAIR) 10 MG tablet Take 10 mg by mouth at bedtime.   06/18/2018 at Unknown time  . omeprazole (PRILOSEC) 20 MG capsule Take 1 capsule (20 mg total) by mouth 2 (two) times daily before a meal. 60 capsule 5 06/19/2018 at Unknown time  . oxyCODONE-acetaminophen (PERCOCET) 10-325 MG tablet  Take 1 tablet by mouth every 4 (four) hours as needed for pain.   06/19/2018 at Unknown time  . predniSONE (DELTASONE) 10 MG tablet Take 2 daily for 1 week then 1 daily (Patient taking differently: Take 20 mg by mouth daily with breakfast. ) 60 tablet 5 06/18/2018 at Unknown time  . rifampin (RIFADIN) 300 MG capsule Take 2 capsules (600 mg total) by mouth daily. 60 capsule 11 06/19/2018 at    . rosuvastatin (CRESTOR) 10 MG tablet Take 10 mg by mouth daily.   11 06/19/2018 at Unknown time  . HYDROcodone-homatropine (HYCODAN) 5-1.5 MG/5ML syrup Take 5 mLs by mouth every 4 (four) hours as needed for cough. (Patient not taking: Reported on 06/19/2018) 120 mL 0 Not Taking at Unknown time  . nitroGLYCERIN (NITROSTAT) 0.4 MG SL tablet Place 1 tablet (0.4 mg total) under the tongue every 5 (five) minutes as needed for chest pain. 25 tablet 4 unknown   Scheduled: . azithromycin  500 mg Oral Daily  . baclofen  20 mg Oral TID  . benzonatate  200 mg Oral TID  . budesonide  0.5 mg Nebulization BID  . diltiazem  120 mg Oral Daily  . enoxaparin (LOVENOX) injection  40 mg Subcutaneous Q24H  . ethambutol  1,200 mg Oral Daily  . fluticasone  2 spray Each Nare Daily  . furosemide  40 mg Intravenous Daily  . guaiFENesin  600 mg Oral TID  . insulin aspart  0-15 Units Subcutaneous TID WC  . insulin aspart  0-5 Units Subcutaneous QHS  . insulin glargine  26 Units Subcutaneous Daily  . ipratropium-albuterol  3 mL Nebulization Once  . levalbuterol  0.63 mg Nebulization Q6H  . loratadine  10 mg Oral Daily  . mouth rinse  15 mL Mouth Rinse BID  . montelukast  10 mg Oral QHS  . pantoprazole  40 mg Oral Daily  . polyethylene glycol  17 g Oral Daily  . potassium chloride  20 mEq Oral BID  . predniSONE  20 mg Oral Q breakfast  . rifampin  600 mg Oral Daily  . rosuvastatin  10 mg Oral Daily  . umeclidinium-vilanterol  1 puff Inhalation Daily   Continuous: . cefTRIAXone (ROCEPHIN)  IV 1 g (06/27/18 0909)    VUY:[id number], albuterol, HYDROcodone-homatropine, LORazepam, menthol-cetylpyridinium, morphine injection, nitroGLYCERIN, ondansetron (ZOFRAN) IV, oxyCODONE-acetaminophen **AND** oxyCODONE  Assesment: He was admitted with left-sided chest pain and ruled out for acute coronary syndrome.  He has severe COPD at baseline and he is having an exacerbation.  He has diabetes which is not totally controlled but I think is from being sick and being on the steroids  He has acute on chronic hypoxic respiratory failure  He now has right lung pain.  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  Continue treatments.    LOS: 7 days   Ryan [name] L 06/27/2018, 10:33 AM

## 2018-06-28 LAB — GLUCOSE, CAPILLARY
Glucose-Capillary: 328 mg/dL — ABNORMAL HIGH (ref 70–99)
Glucose-Capillary: 233 mg/dL — ABNORMAL HIGH (ref 70–99)
Glucose-Capillary: 458 mg/dL — ABNORMAL HIGH (ref 70–99)

## 2018-06-28 MED ORDER — INSULIN ASPART 100 UNIT/ML ~~LOC~~ SOLN
20.0000 [IU] | Freq: Once | SUBCUTANEOUS | Status: AC
  Administered 2018-06-28: 20 [IU] via SUBCUTANEOUS

## 2018-06-28 MED ORDER — PREDNISONE 10 MG PO TABS: ORAL_TABLET | ORAL | 3 refills | Status: DC

## 2018-06-28 NOTE — Progress Notes (Signed)
IV removed, 2x2 gauze and tape applied to site, patient tolerated well.  Reviewed AVS with patient and patient's wife, both verbalized understanding.  Patient to be transported home by his wife.

## 2018-06-28 NOTE — Discharge Summary (Signed)
Physician Discharge Summary  Patient ID: Ryan [name] MRN: [id number] DOB/AGE: Jan 03, 1961 57 y.o. 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  quit smoking 05/01/15    GERD with stricture   ASCVD (arteriosclerotic cardiovascular disease)   Type 2 diabetes mellitus without complication (HCC)   Atrial fibrillation with rapid ventricular response (HCC)   Pulmonary fibrosis (HCC)   Chronic respiratory failure with hypoxia (HCC)   Chest pain   Allergies as of 06/28/2018      Reactions   Albuterol Palpitations   Ciprofloxacin Other (See Comments)   Patient experiences vision loss from long-term and short-term use   Influenza Vaccine Live Swelling      Medication List    TAKE these medications   azithromycin 500 MG tablet Commonly known as:  ZITHROMAX Take 500 mg by mouth daily.   baclofen 20 MG tablet Commonly known as:  LIORESAL Take 20 mg by mouth 3 (three) times daily.   benzonatate 200 MG capsule Commonly known as:  TESSALON Take 200 mg by mouth 3 (three) times daily.   budesonide 0.25 MG/2ML nebulizer solution Commonly known as:  PULMICORT Take 2 mLs (0.25 mg total) by nebulization 2 (two) times daily.   CARTIA XT 120 MG 24 hr capsule Generic drug:  diltiazem Take 1 capsule (120 mg total) by mouth daily.   ethambutol 400 MG tablet Commonly known as:  MYAMBUTOL Take 3 tablets (1,200 mg total) by mouth daily.   fexofenadine 180 MG tablet Commonly known as:  ALLEGRA Take 180 mg by mouth daily.   fluticasone 50 MCG/ACT nasal spray Commonly known as:  FLONASE Place 2 sprays into both nostrils daily.   HYDROcodone-homatropine 5-1.5 MG/5ML syrup Commonly known as:  HYCODAN Take 5 mLs by mouth every 4 (four) hours as needed for cough.   insulin glargine 100 UNIT/ML injection Commonly known as:   LANTUS Inject 0.24 mLs (24 Units total) into the skin daily. 24 units daily What changed:    how much to take  additional instructions   insulin lispro 100 UNIT/ML injection Commonly known as:  HUMALOG Take by sliding scale provided by hospital maximum amount daily is 60 units.  Check blood sugar 4 times a day What changed:    how much to take  how to take this  when to take this   ipratropium 0.02 % nebulizer solution Commonly known as:  ATROVENT Take 0.5 mg by nebulization 2 (two) times daily.   levalbuterol 0.63 MG/3ML nebulizer solution Commonly known as:  XOPENEX Take 0.63 mg by nebulization 2 (two) times daily.   LORazepam 0.5 MG tablet Commonly known as:  ATIVAN Take 1 tablet (0.5 mg total) by mouth 2 (two) times daily as needed for anxiety or sleep. What changed:  when to take this   metFORMIN 500 MG tablet Commonly known as:  GLUCOPHAGE Take 1 tablet (500 mg total) by mouth daily with breakfast.   montelukast 10 MG tablet Commonly known as:  SINGULAIR Take 10 mg by mouth at bedtime.   MUCUS RELIEF 600 MG 12 hr tablet Generic drug:  guaiFENesin Take 600 mg by mouth 3 (three) times daily.   nitroGLYCERIN 0.4 MG SL tablet Commonly known as:  NITROSTAT Place 1 tablet (0.4 mg total) under the tongue every 5 (five) minutes as needed for chest pain.   omeprazole 20  MG capsule Commonly known as:  PRILOSEC Take 1 capsule (20 mg total) by mouth 2 (two) times daily before a meal.   oxyCODONE-acetaminophen 10-325 MG tablet Commonly known as:  PERCOCET Take 1 tablet by mouth every 4 (four) hours as needed for pain.   predniSONE 10 MG tablet Commonly known as:  DELTASONE Take 2 daily for 1 week then 1 daily What changed:    how much to take  how to take this  when to take this  additional instructions   predniSONE 10 MG tablet Commonly known as:  DELTASONE 4 daily for 4 days, 3 daily for 4 days then back to 2 daily What changed:  You were already  taking a medication with the same name, and this prescription was added. Make sure you understand how and when to take each.   rifampin 300 MG capsule Commonly known as:  RIFADIN Take 2 capsules (600 mg total) by mouth daily.   rosuvastatin 10 MG tablet Commonly known as:  CRESTOR Take 10 mg by mouth daily.   TRELEGY ELLIPTA 100-62.5-25 MCG/INH Aepb Generic drug:  Fluticasone-Umeclidin-Vilant Inhale 1 puff into the lungs daily.   VENTOLIN HFA 108 (90 Base) MCG/ACT inhaler Generic drug:  albuterol Inhale 2 puffs into the lungs every 6 (six) hours as needed for wheezing or shortness of breath.       Discharged Condition: Improved    Consults: None  Significant Diagnostic Studies: Dg Chest 2 View  Result Date: 06/19/2018 CLINICAL DATA:  Chest pain today. EXAM: CHEST - 2 VIEW COMPARISON:  04/19/2018 FINDINGS: Heart size is normal.  Aortic atherosclerosis. Moderate to severe emphysema again demonstrated. Scarring is again seen predominately in the left lung, which is unchanged. No evidence of pulmonary infiltrate or edema. No evidence of pneumothorax or pleural effusion. IMPRESSION: Stable emphysema and left lung scarring.  No active disease. Electronically Signed   By: Earle Gell M.D.   On: 06/19/2018 17:48   Ct Angio Chest Pe W/cm &/or Wo Cm  Result Date: 06/19/2018 CLINICAL DATA:  Chest pain EXAM: CT ANGIOGRAPHY CHEST WITH CONTRAST TECHNIQUE: Multidetector CT imaging of the chest was performed using the standard protocol during bolus administration of intravenous contrast. Multiplanar CT image reconstructions and MIPs were obtained to evaluate the vascular anatomy. CONTRAST:  10mL ISOVUE-370 IOPAMIDOL (ISOVUE-370) INJECTION 76% COMPARISON:  Chest radiographs dated 06/19/2018. CT chest dated 09/01/2017. FINDINGS: Cardiovascular: Satisfactory opacification of the bilateral pulmonary arteries to the lobar level. No evidence of pulmonary embolism. No evidence of thoracic aortic aneurysm  or dissection. Mild atherosclerotic calcifications of the aortic arch. 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 Mediastinum/Nodes: No suspicious mediastinal lymphadenopathy. Visualized thyroid is unremarkable. Lungs/Pleura: Tracheobronchomalacia. 1.7 x 4.6 cm cavitary left upper lobe lesion (series 7/image 35), previously 2.7 x 5.2 cm, now with a relatively thin wall. Mild wall thickening persists inferiorly (series 7/image 48), but overall the superimposed infection/inflammation has improved. 2.2 x 6.0 cm thick-walled cavitary lesion in the posterior left lower lobe (series 7/image 90), previously 2.7 x 6.0 cm when measured in a similar fashion on the prior study, with persistent but decreased solid component laterally (series 7/image 82). However, this lesion is progressive when compared to prior CTs from 2017-18. Extensive centrilobular and paraseptal emphysematous changes. Mild scarring/atelectasis in the medial right middle lobe. No focal consolidation. No new/suspicious pulmonary nodules. No pleural effusion or pneumothorax. Upper Abdomen: Visualized upper abdomen is notable for mild hepatic steatosis. Musculoskeletal: Visualized osseous structures are within normal limits. Review  of the MIP images confirms the above findings. IMPRESSION: No evidence of pulmonary embolism. Cavitary lesions in the left upper and lower lobes are stable versus mildly decreased, with decreased superimposed infection/inflammation when compared to recent priors. However, the left lower lobe lesion is progressive when compared to 2017-18. As such, attention on follow-up is suggested. Tracheobronchomalacia. Aortic Atherosclerosis (ICD10-I70.0) and Emphysema (ICD10-J43.9). Electronically Signed   By: Julian Hy M.D.   On: 06/19/2018 20:36    Lab Results: Basic Metabolic Panel: Recent Labs    06/27/18 0714  NA 135  K 4.3  CL 97*  CO2 30  GLUCOSE 275*  BUN 13  CREATININE 0.94  CALCIUM 8.2*   Liver  Function Tests: Recent Labs    06/27/18 0714  AST 14*  ALT 23  ALKPHOS 45  BILITOT 0.4  PROT 6.4*  ALBUMIN 3.3*     CBC: Recent Labs    06/27/18 0714  WBC 5.2  NEUTROABS 2.4  HGB 10.6*  HCT 33.8*  MCV 88.5  PLT 159    Recent Results (from the past 240 hour(s))  MRSA PCR Screening     Status: None   Collection Time: 06/19/18 10:06 PM  Result Value Ref Range Status   MRSA by PCR NEGATIVE NEGATIVE Final    Comment:        The GeneXpert MRSA Assay (FDA approved for NASAL specimens only), is one component of a comprehensive MRSA colonization surveillance program. It is not intended to diagnose MRSA infection nor to guide or monitor treatment for MRSA infections. Performed at [facility], [address], [place], [place] [address]      Hospital Course: This is a 57 year old with multiple medical problems who came to the emergency department with left-sided chest pain.  He ruled out for acute coronary syndrome.  He does have known cardiac disease.  At baseline he has severe COPD and also has Mycobacterium avium disease.  His chest pain mostly resolved and he had chest pain on the right side.  He had an exacerbation of COPD which was treated.  By the time of discharge he was back at baseline and ready to go home.  Discharge Exam: Blood pressure 139/84, pulse 81, temperature 98.4 F (36.9 C), temperature source Oral, resp. rate 18, height 5\' 8"  (1.727 m), weight 77.8 kg, SpO2 97 %. He is awake and alert.  Chest is clear.  Heart is regular.  Abdomen soft  Disposition: Home      Signed: Jenika Chiem L   06/28/2018, 8:59 AM

## 2018-06-28 NOTE — Care Management Important Message (Signed)
Important Message  Patient Details  Name: CASIMIRO LIENHARD MRN: [id number] Date of Birth: 06/02/1961   Medicare Important Message Given:  Yes    [name] 06/28/2018, 11:21 AM

## 2018-06-28 NOTE — Progress Notes (Signed)
He still has cough and congestion but says he feels about back at his baseline.  He still has chest discomfort but is not as severe and he understands that he is going to have chronic pain.  He wants to go home today which I think is appropriate.  Exam: He is awake and alert.  He is in no acute distress.  His chest shows rhonchi but back to baseline his heart is regular.  No edema  Assessment: He came in with chest pain and ruled out for acute MI.  He has had more chest pain since that and I think it is all related to his chronic lung disease.  Plan: Discharge home today

## 2018-07-05 LAB — GLUCOSE, CAPILLARY: Glucose-Capillary: 238 mg/dL — ABNORMAL HIGH (ref 70–99)

## 2018-07-24 DIAGNOSIS — J449 Chronic obstructive pulmonary disease, unspecified: Secondary | ICD-10-CM | POA: Diagnosis not present

## 2018-08-04 ENCOUNTER — Encounter: Payer: Self-pay | Admitting: Gastroenterology

## 2018-08-04 ENCOUNTER — Ambulatory Visit: Payer: Medicare Other | Admitting: Gastroenterology

## 2018-08-04 VITALS — BP 148/85 | HR 98 | Temp 97.2°F | Ht 68.0 in | Wt 175.0 lb

## 2018-08-04 DIAGNOSIS — R1310 Dysphagia, unspecified: Secondary | ICD-10-CM

## 2018-08-04 DIAGNOSIS — K2100 Gastro-esophageal reflux disease with esophagitis, without bleeding: Secondary | ICD-10-CM

## 2018-08-04 DIAGNOSIS — K219 Gastro-esophageal reflux disease without esophagitis: Secondary | ICD-10-CM | POA: Insufficient documentation

## 2018-08-04 MED ORDER — OMEPRAZOLE 20 MG PO CPDR: 20.0000 mg | DELAYED_RELEASE_CAPSULE | Freq: Two times a day (BID) | ORAL | 3 refills | Status: DC

## 2018-08-04 NOTE — Patient Instructions (Signed)
I have provided the prescription for omeprazole (Prilosec) twice a day, 30 minutes before breakfast and dinner.  Please call if any recurrent issues swallowing.  We will see you in 6 months or sooner if needed!  I enjoyed seeing you again today! As you know, I value our relationship and want to provide genuine, compassionate, and quality care. I welcome your feedback. If you receive a survey regarding your visit,  I greatly appreciate you taking time to fill this out. See you next time!  Annitta Needs, PhD, ANP-BC [facility]

## 2018-08-04 NOTE — Assessment & Plan Note (Signed)
With esophagitis. PPI BID, prescription provided. Return in 6 months.

## 2018-08-04 NOTE — Progress Notes (Signed)
Referring Provider: [name], MD Primary Care Physician:  [name], MD  Primary GI: Dr. Gala Romney   Chief Complaint  Patient presents with  . Dysphagia    f/u.     HPI:   Ryan [name] is a 58 y.o. male presenting today with a history of dysphagia, s/p EGD/dilation in Oct 2019. Findings of erosive reflux esophagitis and Candida esophagitis, Schatzki's ring s/p dilation, hiatal hernia. 6 liters O2 continuously. At baseline for respiratory status. History significant for severe COPD, followed by ID and on chronic antibiotics for chronic cavitary lung lesions (first noted many years ago) and pulmonary MAI infection.   States he now has issues with vision due to medications. Has had issues with blurred vision and now worsening and was to follow-up with Dr. Luan Pulling after hospitalization. Feels vision is worsening. Will be calling again this week as office is closed. States started after resuming antibiotics.   No dysphagia. Taking omeprazole once a day. Didn't get it for BID from pharmacy. No abdominal pain. No rectal bleeding.     Past Medical History:  Diagnosis Date  . Atrial fibrillation ([place])    Not anticoagulated  . Barrett's esophagus   . Borderline diabetes   . Bronchitis   . CAP (community acquired pneumonia) 07/10/2013   04/2015  . 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 Chronic back pain   . Chronic left shoulder pain   . Chronic neck pain   . Chronic respiratory failure ([place])   . Collagen vascular disease ([place])   . COPD (chronic obstructive pulmonary disease) ([place])   . Coronary atherosclerosis of native coronary artery    Mild nonobstructive disease at catheterization 2007  . DDD (degenerative disc disease)    Cervical and thoracic  . GERD (gastroesophageal reflux disease)   . History of pneumonia   . Hypercholesteremia   . Polycythemia   . Seizures ([place])    Last seizure 2 yrs ago  . Smoker   . Type 2 diabetes  mellitus ([place])   . Type 2 diabetes mellitus without complication [facility])     Past Surgical History:  Procedure Laterality Date  . COLONOSCOPY  2012   Dr. Posey Pronto: normal  . ESOPHAGOGASTRODUODENOSCOPY N/A 05/04/2015   Dr. Michail Sermon: minimal erosive esophagitis, small hiatal hernia. FOOD PRECLUDED A COMPLETE EXAM. No biopsies taken  . ESOPHAGOGASTRODUODENOSCOPY  2011   [place]: Barrett's   . ESOPHAGOGASTRODUODENOSCOPY (EGD) WITH PROPOFOL N/A 05/06/2018   Dr. Gala Romney: erosive reflux esophagitis and +candida, Schatzki's ring s/p dilation, hiatal hernia.  [name] LUNG BIOPSY    . MALONEY DILATION N/A 05/06/2018   Procedure: Venia Minks DILATION;  Surgeon: Daneil Dolin, MD;  Location: [facility] SUITE;  Service: Endoscopy;  Laterality: N/A;  . Throat biopsy    . VASECTOMY    . VASECTOMY  1987    Current Outpatient Medications  Medication Sig Dispense Refill  . albuterol (VENTOLIN HFA) 108 (90 BASE) MCG/ACT inhaler Inhale 2 puffs into the lungs every 6 (six) hours as needed for wheezing or shortness of breath.    [name] azithromycin (ZITHROMAX) 500 MG tablet Take 500 mg by mouth daily.    . baclofen (LIORESAL) 20 MG tablet Take 20 mg by mouth 3 (three) times daily.     . benzonatate (TESSALON) 200 MG capsule Take 200 mg by mouth 3 (three) times daily.    . budesonide (PULMICORT) 0.25 MG/2ML nebulizer solution Take 2 mLs (0.25 mg  total) by nebulization 2 (two) times daily. 60 mL 12  . CARTIA XT 120 MG 24 hr capsule Take 1 capsule (120 mg total) by mouth daily. 7 capsule 0  . ethambutol (MYAMBUTOL) 400 MG tablet Take 3 tablets (1,200 mg total) by mouth daily. 90 tablet 11  . fexofenadine (ALLEGRA) 180 MG tablet Take 180 mg by mouth daily.    . fluticasone (FLONASE) 50 MCG/ACT nasal spray Place 2 sprays into both nostrils daily.   5  . Fluticasone-Umeclidin-Vilant (TRELEGY ELLIPTA) 100-62.5-25 MCG/INH AEPB Inhale 1 puff into the lungs daily.    [name] guaiFENesin (MUCUS RELIEF) 600 MG 12 hr tablet Take 600 mg by mouth  3 (three) times daily.    [name] HYDROcodone-homatropine (HYCODAN) 5-1.5 MG/5ML syrup Take 5 mLs by mouth every 4 (four) hours as needed for cough. 120 mL 0  . insulin glargine (LANTUS) 100 UNIT/ML injection Inject 0.24 mLs (24 Units total) into the skin daily. 24 units daily (Patient taking differently: Inject 26 Units into the skin daily. ) 10 mL 11  . insulin lispro (HUMALOG) 100 UNIT/ML injection Take by sliding scale provided by hospital maximum amount daily is 60 units.  Check blood sugar 4 times a day (Patient taking differently: Inject 0-15 Units into the skin 3 (three) times daily with meals. Take by sliding scale provided by hospital maximum amount daily is 60 units.  Check blood sugar 4 times a day) 10 mL 11  . ipratropium (ATROVENT) 0.02 % nebulizer solution Take 0.5 mg by nebulization 2 (two) times daily.   5  . levalbuterol (XOPENEX) 0.63 MG/3ML nebulizer solution Take 0.63 mg by nebulization 2 (two) times daily.    [name] LORazepam (ATIVAN) 0.5 MG tablet Take 1 tablet (0.5 mg total) by mouth 2 (two) times daily as needed for anxiety or sleep. (Patient taking differently: Take 0.5 mg by mouth 4 (four) times daily. ) 20 tablet 0  . metFORMIN (GLUCOPHAGE) 500 MG tablet Take 1 tablet (500 mg total) by mouth daily with breakfast. 30 tablet 3  . montelukast (SINGULAIR) 10 MG tablet Take 10 mg by mouth at bedtime.    . nitroGLYCERIN (NITROSTAT) 0.4 MG SL tablet Place 1 tablet (0.4 mg total) under the tongue every 5 (five) minutes as needed for chest pain. 25 tablet 4  . omeprazole (PRILOSEC) 20 MG capsule Take 1 capsule (20 mg total) by mouth 2 (two) times daily before a meal. 60 capsule 5  . oxyCODONE-acetaminophen (PERCOCET) 10-325 MG tablet Take 1 tablet by mouth every 4 (four) hours as needed for pain.    . predniSONE (DELTASONE) 10 MG tablet Take 2 daily for 1 week then 1 daily (Patient taking differently: Take 20 mg by mouth daily with breakfast. ) 60 tablet 5  . rifampin (RIFADIN) 300 MG capsule  Take 2 capsules (600 mg total) by mouth daily. 60 capsule 11  . rosuvastatin (CRESTOR) 10 MG tablet Take 10 mg by mouth daily.   11  . predniSONE (DELTASONE) 10 MG tablet 4 daily for 4 days, 3 daily for 4 days then back to 2 daily (Patient not taking: Reported on 08/04/2018) 100 tablet 3   No current facility-administered medications for this visit.     Allergies as of 08/04/2018 - Review Complete 08/04/2018  Allergen Reaction Noted  . Albuterol Palpitations 05/01/2015  . Ciprofloxacin Other (See Comments) 09/15/2017  . Influenza vaccine live Swelling 04/24/2011    Family History  Problem Relation Age of Onset  . Hypertension Mother   .  Diabetes Mother   . Heart attack Mother   . Heart failure Mother   . Hypertension Sister   . Diabetes Sister   . Heart failure Sister   . Colon cancer Neg Hx     Social History   Socioeconomic History  . Marital status: Married    Spouse name: [name]  . Number of children: 4  . Years of education: 10  . Highest education level: Not on file  Occupational History  . Not on file  Social Needs  . Financial resource strain: Not on file  . Food insecurity:    Worry: Not on file    Inability: Not on file  . Transportation needs:    Medical: Not on file    Non-medical: Not on file  Tobacco Use  . Smoking status: Former Smoker    Packs/day: 0.50    Years: 45.00    Pack years: 22.50    Types: Cigarettes    Last attempt to quit: 05/01/2015    Years since quitting: 3.2  . Smokeless tobacco: Never Used  Substance and Sexual Activity  . Alcohol use: No    Alcohol/week: 0.0 standard drinks  . Drug use: No  . Sexual activity: Yes    Birth control/protection: Surgical  Lifestyle  . Physical activity:    Days per week: Not on file    Minutes per session: Not on file  . Stress: Not on file  Relationships  . Social connections:    Talks on phone: Not on file    Gets together: Not on file    Attends religious service: Not on file     Active member of club or organization: Not on file    Attends meetings of clubs or organizations: Not on file    Relationship status: Not on file  Other Topics Concern  . Not on file  Social History Narrative   Patient is married [name]) and lives at home with his wife and one child.   Patient has four children.   Patient is disabled.   Patient has a high school education.   Patient is left-handed.   Patient does not drink any caffeine.    Review of Systems: Gen: Denies fever, chills, anorexia. Denies fatigue, weakness, weight loss.  CV: Denies chest pain, palpitations, syncope, peripheral edema, and claudication. Resp: Denies dyspnea at rest, cough, wheezing, coughing up blood, and pleurisy. GI: see HPI Derm: Denies rash, itching, dry skin Psych: Denies depression, anxiety, memory loss, confusion. No homicidal or suicidal ideation.  Heme: Denies bruising, bleeding, and enlarged lymph nodes.  Physical Exam: BP (!) 148/85   Pulse 98   Temp (!) 97.2 F (36.2 C) (Oral)   Ht 5\' 8"  (1.727 m)   Wt 175 lb (79.4 kg)   BMI 26.61 kg/m  General:   Alert and oriented. No distress noted. Pleasant and cooperative. On 6 liters nasal cannula.  Head:  Normocephalic and atraumatic. Eyes:  Conjuctiva clear without scleral icterus. Mouth:  Oral mucosa pink and moist. Abdomen:  +BS, distended but soft. No rebound or guarding. No HSM or masses noted. Msk:  Symmetrical without gross deformities. Normal posture. Extremities:  Without edema. Neurologic:  Alert and  oriented x4 Psych:  Alert and cooperative. Normal mood and affect.

## 2018-08-04 NOTE — Assessment & Plan Note (Signed)
Recent EGD with erosive esophagitis and Candida, Schatzki ring s/p dilation. Dysphagia now resolved. He did not receive a BID PPI prescription from pharmacy, so I have printed this to hand carry again so there is no confusion. Will hold off on candida treatment UNLESS he becomes symptomatic. At this point, he is on multiple medications and would want to treat only if necessary. Continue Prilosec BID. Return in 6 months.  As of note: he has had chronic vision issues, blurry vision in setting of likely med effect. 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 Now with declining vision over time, and he was to follow-up with PCP this week post-hospitalization but had to postpone. I have asked him to call today and update regarding status.

## 2018-08-05 NOTE — Progress Notes (Signed)
CC'D TO PCP °

## 2018-08-12 ENCOUNTER — Emergency Department (HOSPITAL_COMMUNITY)
Admission: EM | Admit: 2018-08-12 | Discharge: 2018-08-12 | Disposition: A | Discharge status: Home / Self Care | Payer: Medicare Other | Attending: Emergency Medicine | Admitting: Emergency Medicine

## 2018-08-12 ENCOUNTER — Encounter (HOSPITAL_COMMUNITY): Payer: Self-pay | Admitting: Emergency Medicine

## 2018-08-12 ENCOUNTER — Encounter: Payer: Self-pay | Admitting: Internal Medicine

## 2018-08-12 ENCOUNTER — Emergency Department (HOSPITAL_COMMUNITY): Payer: Medicare Other

## 2018-08-12 ENCOUNTER — Other Ambulatory Visit: Payer: Self-pay

## 2018-08-12 DIAGNOSIS — E1165 Type 2 diabetes mellitus with hyperglycemia: Secondary | ICD-10-CM | POA: Diagnosis not present

## 2018-08-12 DIAGNOSIS — R739 Hyperglycemia, unspecified: Secondary | ICD-10-CM

## 2018-08-12 DIAGNOSIS — R1013 Epigastric pain: Secondary | ICD-10-CM | POA: Insufficient documentation

## 2018-08-12 DIAGNOSIS — R079 Chest pain, unspecified: Secondary | ICD-10-CM | POA: Insufficient documentation

## 2018-08-12 DIAGNOSIS — I2510 Atherosclerotic heart disease of native coronary artery without angina pectoris: Secondary | ICD-10-CM | POA: Insufficient documentation

## 2018-08-12 DIAGNOSIS — J449 Chronic obstructive pulmonary disease, unspecified: Secondary | ICD-10-CM | POA: Insufficient documentation

## 2018-08-12 DIAGNOSIS — R0602 Shortness of breath: Secondary | ICD-10-CM | POA: Diagnosis not present

## 2018-08-12 DIAGNOSIS — Z87891 Personal history of nicotine dependence: Secondary | ICD-10-CM | POA: Insufficient documentation

## 2018-08-12 DIAGNOSIS — K449 Diaphragmatic hernia without obstruction or gangrene: Secondary | ICD-10-CM | POA: Diagnosis not present

## 2018-08-12 DIAGNOSIS — K760 Fatty (change of) liver, not elsewhere classified: Secondary | ICD-10-CM | POA: Diagnosis not present

## 2018-08-12 LAB — URINALYSIS, ROUTINE W REFLEX MICROSCOPIC
Bacteria, UA: NONE SEEN
Bilirubin Urine: NEGATIVE
Glucose, UA: >=500 mg/dL — AB
HGB URINE DIPSTICK: NEGATIVE
Ketones, ur: NEGATIVE mg/dL
Leukocytes, UA: NEGATIVE
Nitrite: NEGATIVE
Protein, ur: 30 mg/dL — AB
Specific Gravity, Urine: 1.036 — ABNORMAL HIGH (ref 1.005–1.030)
pH: 6 (ref 5.0–8.0)

## 2018-08-12 LAB — COMPREHENSIVE METABOLIC PANEL
ALT: 42 U/L (ref 0–44)
AST: 26 U/L (ref 15–41)
Albumin: 4 g/dL (ref 3.5–5.0)
Alkaline Phosphatase: 58 U/L (ref 38–126)
Anion gap: 10 (ref 5–15)
BUN: 16 mg/dL (ref 6–20)
CO2: 29 mmol/L (ref 22–32)
Calcium: 9.2 mg/dL (ref 8.9–10.3)
Chloride: 96 mmol/L — ABNORMAL LOW (ref 98–111)
Creatinine, Ser: 1 mg/dL (ref 0.61–1.24)
GFR calc non Af Amer: >60 mL/min (ref >60)
Glucose, Bld: 297 mg/dL — ABNORMAL HIGH (ref 70–99)
Potassium: 4.4 mmol/L (ref 3.5–5.1)
SODIUM: 135 mmol/L (ref 135–145)
Total Bilirubin: 0.4 mg/dL (ref 0.3–1.2)
Total Protein: 7.6 g/dL (ref 6.5–8.1)

## 2018-08-12 LAB — LACTIC ACID, PLASMA
LACTIC ACID, VENOUS: 2.2 mmol/L — AB (ref 0.5–1.9)
Lactic Acid, Venous: 2.8 mmol/L (ref 0.5–1.9)

## 2018-08-12 LAB — BLOOD GAS, VENOUS
Acid-Base Excess: 5.6 mmol/L — ABNORMAL HIGH (ref 0.0–2.0)
Bicarbonate: 27 mmol/L (ref 20.0–28.0)
FIO2: 28
O2 Saturation: 41.9 %
PATIENT TEMPERATURE: 36.6
pCO2, Ven: 50 mmHg (ref 44.0–60.0)
pH, Ven: 7.399 (ref 7.250–7.430)
pO2, Ven: <31 mmHg — CL (ref 32.0–45.0)

## 2018-08-12 LAB — CBG MONITORING, ED: Glucose-Capillary: 293 mg/dL — ABNORMAL HIGH (ref 70–99)

## 2018-08-12 LAB — CBC
HCT: 42.9 % (ref 39.0–52.0)
Hemoglobin: 13.7 g/dL (ref 13.0–17.0)
MCH: 27.1 pg (ref 26.0–34.0)
MCHC: 31.9 g/dL (ref 30.0–36.0)
MCV: 84.8 fL (ref 80.0–100.0)
Platelets: 165 10*3/uL (ref 150–400)
RBC: 5.06 MIL/uL (ref 4.22–5.81)
RDW: 13.2 % (ref 11.5–15.5)
WBC: 6.8 10*3/uL (ref 4.0–10.5)
nRBC: 0 % (ref 0.0–0.2)

## 2018-08-12 LAB — TROPONIN I: Troponin I: <0.03 ng/mL (ref <0.03)

## 2018-08-12 LAB — LIPASE, BLOOD: Lipase: 24 U/L (ref 11–51)

## 2018-08-12 MED ORDER — SODIUM CHLORIDE 0.9 % IV BOLUS
500.0000 mL | Freq: Once | INTRAVENOUS | Status: AC
  Administered 2018-08-12: 500 mL via INTRAVENOUS

## 2018-08-12 MED ORDER — LIDOCAINE VISCOUS HCL 2 % MT SOLN
15.0000 mL | Freq: Once | OROMUCOSAL | Status: AC
  Administered 2018-08-12: 15 mL via ORAL
  Filled 2018-08-12: qty 15

## 2018-08-12 MED ORDER — OXYCODONE HCL 5 MG PO TABS
10.0000 mg | ORAL_TABLET | Freq: Once | ORAL | Status: AC
  Administered 2018-08-12: 10 mg via ORAL
  Filled 2018-08-12: qty 2

## 2018-08-12 MED ORDER — FAMOTIDINE IN NACL 20-0.9 MG/50ML-% IV SOLN
20.0000 mg | Freq: Once | INTRAVENOUS | Status: AC
  Administered 2018-08-12: 20 mg via INTRAVENOUS
  Filled 2018-08-12: qty 50

## 2018-08-12 MED ORDER — IOPAMIDOL (ISOVUE-300) INJECTION 61%
100.0000 mL | Freq: Once | INTRAVENOUS | Status: AC | PRN
  Administered 2018-08-12: 100 mL via INTRAVENOUS

## 2018-08-12 MED ORDER — ALUM & MAG HYDROXIDE-SIMETH 200-200-20 MG/5ML PO SUSP
30.0000 mL | Freq: Once | ORAL | Status: AC
  Administered 2018-08-12: 30 mL via ORAL
  Filled 2018-08-12: qty 30

## 2018-08-12 MED ORDER — SODIUM CHLORIDE 0.9 % IV BOLUS
1000.0000 mL | Freq: Once | INTRAVENOUS | Status: AC
  Administered 2018-08-12: 1000 mL via INTRAVENOUS

## 2018-08-12 NOTE — ED Notes (Signed)
CBG 293 in ED

## 2018-08-12 NOTE — ED Provider Notes (Signed)
[facility] Emergency Department Provider Note MRN:  [id number]  Arrival date & time: 08/12/18     Chief Complaint   Hyperglycemia   History of Present Illness   Ryan [name] is a 58 y.o. year-old male with a history of COPD, CAD, diabetes, polycythemia presenting to the ED with chief complaint of shortness of breath.  Increased shortness of breath for the past 2 days.  Today began to feel lightheaded, noticed that his blood sugar was reading elevated, at 400.  Also endorsing lower chest/epigastric pain, which is moderate in severity.  Denies headache or vision change, no nausea or vomiting, no cough, no fever, no lower abdominal pain, no diarrhea, no rash, no numbness weakness to the arms or legs.  Denies drug or alcohol use.  Review of Systems  A complete 10 system review of systems was obtained and all systems are negative except as noted in the HPI and PMH.   Patient's Health History    Past Medical History:  Diagnosis Date  . Atrial fibrillation ([place])    Not anticoagulated  . Barrett's esophagus   . Borderline diabetes   . Bronchitis   . CAP (community acquired pneumonia) 07/10/2013   04/2015  . 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 Chronic back pain   . Chronic left shoulder pain   . Chronic neck pain   . Chronic respiratory failure ([place])   . Collagen vascular disease ([place])   . COPD (chronic obstructive pulmonary disease) ([place])   . Coronary atherosclerosis of native coronary artery    Mild nonobstructive disease at catheterization 2007  . DDD (degenerative disc disease)    Cervical and thoracic  . GERD (gastroesophageal reflux disease)   . History of pneumonia   . Hypercholesteremia   . Polycythemia   . Seizures ([place])    Last seizure 2 yrs ago  . Smoker   . Type 2 diabetes mellitus ([place])   . Type 2 diabetes mellitus without complication [facility])     Past Surgical History:  Procedure Laterality Date  .  COLONOSCOPY  2012   Dr. Posey Pronto: normal  . ESOPHAGOGASTRODUODENOSCOPY N/A 05/04/2015   Dr. Michail Sermon: minimal erosive esophagitis, small hiatal hernia. FOOD PRECLUDED A COMPLETE EXAM. No biopsies taken  . ESOPHAGOGASTRODUODENOSCOPY  2011   [place]: Barrett's   . ESOPHAGOGASTRODUODENOSCOPY (EGD) WITH PROPOFOL N/A 05/06/2018   Dr. Gala Romney: erosive reflux esophagitis and +candida, Schatzki's ring s/p dilation, hiatal hernia.  [name] LUNG BIOPSY    . MALONEY DILATION N/A 05/06/2018   Procedure: Venia Minks DILATION;  Surgeon: Daneil Dolin, MD;  Location: [facility] ENDO SUITE;  Service: Endoscopy;  Laterality: N/A;  . Throat biopsy    . VASECTOMY    . VASECTOMY  1987    Family History  Problem Relation Age of Onset  . Hypertension Mother   . Diabetes Mother   . Heart attack Mother   . Heart failure Mother   . Hypertension Sister   . Diabetes Sister   . Heart failure Sister   . Colon cancer Neg Hx     Social History   Socioeconomic History  . Marital status: Married    Spouse name: [name]  . Number of children: 4  . Years of education: 10  . Highest education level: Not on file  Occupational History  . Not on file  Social Needs  . Financial resource strain: Not on file  . Food insecurity:    Worry:  Not on file    Inability: Not on file  . Transportation needs:    Medical: Not on file    Non-medical: Not on file  Tobacco Use  . Smoking status: Former Smoker    Packs/day: 0.50    Years: 45.00    Pack years: 22.50    Types: Cigarettes    Last attempt to quit: 05/01/2015    Years since quitting: 3.2  . Smokeless tobacco: Never Used  Substance and Sexual Activity  . Alcohol use: No    Alcohol/week: 0.0 standard drinks  . Drug use: No  . Sexual activity: Yes    Birth control/protection: Surgical  Lifestyle  . Physical activity:    Days per week: Not on file    Minutes per session: Not on file  . Stress: Not on file  Relationships  . Social connections:    Talks on phone: Not on  file    Gets together: Not on file    Attends religious service: Not on file    Active member of club or organization: Not on file    Attends meetings of clubs or organizations: Not on file    Relationship status: Not on file  . Intimate partner violence:    Fear of current or ex partner: Not on file    Emotionally abused: Not on file    Physically abused: Not on file    Forced sexual activity: Not on file  Other Topics Concern  . Not on file  Social History Narrative   Patient is married [name]) and lives at home with his wife and one child.   Patient has four children.   Patient is disabled.   Patient has a high school education.   Patient is left-handed.   Patient does not drink any caffeine.     Physical Exam  Vital Signs and Nursing Notes reviewed Vitals:   08/12/18 1445 08/12/18 1500  BP:  129/72  Pulse: 86 89  Resp: (!) 22   Temp:    SpO2: 96% 98%    CONSTITUTIONAL: Chronically ill appearing, NAD NEURO:  Alert and oriented x 3, no focal deficits EYES:  eyes equal and reactive ENT/NECK:  no LAD, no JVD CARDIO: Regular rate, well-perfused, normal S1 and S2 PULM:  CTAB no wheezing or rhonchi GI/GU:  normal bowel sounds, moderately-distended, moderate epigastric tenderness to palpation MSK/SPINE:  No gross deformities, no edema SKIN:  no rash, atraumatic PSYCH:  Appropriate speech and behavior  Diagnostic and Interventional Summary    EKG Interpretation  Date/Time:  Thursday August 12 2018 12:56:49 EST Ventricular Rate:  92 PR Interval:    QRS Duration: 87 QT Interval:  354 QTC Calculation: 438 R Axis:   7 Text Interpretation:  Sinus rhythm Abnormal R-wave progression, early transition Confirmed by Gerlene Fee [id number]) on 08/12/2018 1:14:24 PM      Labs Reviewed  COMPREHENSIVE METABOLIC PANEL - Abnormal; Notable for the following components:      Result Value   Chloride 96 (*)    Glucose, Bld 297 (*)    All other components within normal limits   LACTIC ACID, PLASMA - Abnormal; Notable for the following components:   Lactic Acid, Venous 2.8 (*)    All other components within normal limits  URINALYSIS, ROUTINE W REFLEX MICROSCOPIC - Abnormal; Notable for the following components:   Color, Urine AMBER (*)    Specific Gravity, Urine 1.036 (*)    Glucose, UA >=500 (*)    Protein, ur  30 (*)    All other components within normal limits  BLOOD GAS, VENOUS - Abnormal; Notable for the following components:   pO2, Ven <31.0 (*)    Acid-Base Excess 5.6 (*)    All other components within normal limits  LACTIC ACID, PLASMA - Abnormal; Notable for the following components:   Lactic Acid, Venous 2.2 (*)    All other components within normal limits  CBG MONITORING, ED - Abnormal; Notable for the following components:   Glucose-Capillary 293 (*)    All other components within normal limits  CBC  LIPASE, BLOOD  TROPONIN I    CT ABDOMEN PELVIS W CONTRAST  Final Result    DG Chest 2 View  Final Result      Medications  oxyCODONE (Oxy IR/ROXICODONE) immediate release tablet 10 mg (has no administration in time range)  sodium chloride 0.9 % bolus 1,000 mL (0 mLs Intravenous Stopped 08/12/18 1450)  sodium chloride 0.9 % bolus 500 mL (0 mLs Intravenous Stopped 08/12/18 1651)  iopamidol (ISOVUE-300) 61 % injection 100 mL (100 mLs Intravenous Contrast Given 08/12/18 1453)  famotidine (PEPCID) IVPB 20 mg premix (0 mg Intravenous Stopped 08/12/18 1650)  alum & mag hydroxide-simeth (MAALOX/MYLANTA) 200-200-20 MG/5ML suspension 30 mL (30 mLs Oral Given 08/12/18 1556)    And  lidocaine (XYLOCAINE) 2 % viscous mouth solution 15 mL (15 mLs Oral Given 08/12/18 1556)     Procedures Critical Care  ED Course and Medical Decision Making  I have reviewed the triage vital signs and the nursing notes.  Pertinent labs & imaging results that were available during my care of the patient were reviewed by me and considered in my medical decision making (see  below for details).  Considering DKA, ACS, cryptitis, COPD exacerbation in this 58 year old male with multiple comorbidities.  Recent echo reveals normal EF.  Will provide IV fluids, await laboratory results.  EKG with no ischemic changes.  Labs are largely reassuring, initial lactate of 2.8 improved to 2.2 after IV fluids.  Glucose improved to high 200s.  Troponin negative.  Chest x-ray unchanged, CT abdomen with no acute process.  Chest/epigastric pain favored to be GERD related to his hiatal hernia, feels better after Pepcid and GI cocktail.  Patient is on his baseline 6 L of oxygen satting 100%.  VBG largely reassuring, oxygenation measured as low but not able to reasonably assess with venous blood.  ABG not indicated given patient's lack of any respiratory symptoms currently, normal O2 sat.  Advised to follow-up with his regular doctor within the next 3 to 5 days for repeat evaluation, no indication for admission at this time.  After the discussed management above, the patient was determined to be safe for discharge.  The patient was in agreement with this plan and all questions regarding their care were answered.  ED return precautions were discussed and the patient will return to the ED with any significant worsening of condition.  Barth Kirks. Sedonia Small, MD [place] [email or web address]  Final Clinical Impressions(s) / ED Diagnoses     ICD-10-CM   1. Hyperglycemia R73.9   2. Chest pain R07.9 DG Chest 2 View    DG Chest 2 View    ED Discharge Orders    None         Maudie Flakes, MD 08/12/18 [phone]

## 2018-08-12 NOTE — ED Notes (Signed)
Date and time results received: 08/12/18 1405 (use smartphrase ".now" to insert current time)  Test: LAC ACID Critical Value: 2.8  Name of Provider Notified: BERO  Orders Received? Or Actions Taken?:  SEE CHART

## 2018-08-12 NOTE — ED Notes (Signed)
Date and time results received: 08/12/18 1:36 PM  Test: pO2  Critical Value: <31.0  Name of Provider Notified: Bero  Orders Received? Or Actions Taken?: no new orders at this time.

## 2018-08-12 NOTE — ED Notes (Signed)
Date and time results received: 08/12/18 1650 (use smartphrase ".now" to insert current time)  Test: lactic acid Critical Value: 2.2  Name of Provider Notified: Dr. Sedonia Small  Orders Received? Or Actions Taken?: none at this time

## 2018-08-12 NOTE — ED Triage Notes (Signed)
Blood sugar was over 400, took Novolog 15 units recheck 2 times BS still over 400, was told to come to ED. Triage BS 293

## 2018-08-12 NOTE — ED Triage Notes (Signed)
Onset 2 days ago SOB,  This morning elevated blood sugar.

## 2018-08-12 NOTE — Discharge Instructions (Addendum)
You were evaluated in the Emergency Department and after careful evaluation, we did not find any emergent condition requiring admission or further testing in the hospital.  Your symptoms today seem to be due to high blood sugar which can cause dehydration.  Keep a close eye on your blood sugars and drink plenty of fluids at home.  Please see your regular doctor within the next 3 to 5 days for reevaluation.  Please return to the Emergency Department if you experience any worsening of your condition.  We encourage you to follow up with a primary care provider.  Thank you for allowing [place] to be a part of your care.

## 2018-08-16 ENCOUNTER — Other Ambulatory Visit: Payer: Self-pay

## 2018-08-16 ENCOUNTER — Emergency Department (HOSPITAL_COMMUNITY): Payer: Medicare Other

## 2018-08-16 ENCOUNTER — Encounter (HOSPITAL_COMMUNITY): Payer: Self-pay | Admitting: *Deleted

## 2018-08-16 ENCOUNTER — Emergency Department (HOSPITAL_COMMUNITY)
Admission: EM | Admit: 2018-08-16 | Discharge: 2018-08-16 | Disposition: A | Discharge status: Home / Self Care | Payer: Medicare Other | Attending: Emergency Medicine | Admitting: Emergency Medicine

## 2018-08-16 DIAGNOSIS — Z87891 Personal history of nicotine dependence: Secondary | ICD-10-CM | POA: Insufficient documentation

## 2018-08-16 DIAGNOSIS — J45909 Unspecified asthma, uncomplicated: Secondary | ICD-10-CM | POA: Diagnosis not present

## 2018-08-16 DIAGNOSIS — R739 Hyperglycemia, unspecified: Secondary | ICD-10-CM

## 2018-08-16 DIAGNOSIS — I2510 Atherosclerotic heart disease of native coronary artery without angina pectoris: Secondary | ICD-10-CM | POA: Diagnosis not present

## 2018-08-16 DIAGNOSIS — E1165 Type 2 diabetes mellitus with hyperglycemia: Secondary | ICD-10-CM | POA: Insufficient documentation

## 2018-08-16 DIAGNOSIS — Z794 Long term (current) use of insulin: Secondary | ICD-10-CM | POA: Insufficient documentation

## 2018-08-16 DIAGNOSIS — R05 Cough: Secondary | ICD-10-CM | POA: Diagnosis not present

## 2018-08-16 DIAGNOSIS — H539 Unspecified visual disturbance: Secondary | ICD-10-CM | POA: Diagnosis not present

## 2018-08-16 DIAGNOSIS — Z79899 Other long term (current) drug therapy: Secondary | ICD-10-CM | POA: Insufficient documentation

## 2018-08-16 DIAGNOSIS — I5032 Chronic diastolic (congestive) heart failure: Secondary | ICD-10-CM | POA: Diagnosis not present

## 2018-08-16 DIAGNOSIS — I482 Chronic atrial fibrillation, unspecified: Secondary | ICD-10-CM | POA: Diagnosis not present

## 2018-08-16 DIAGNOSIS — J9611 Chronic respiratory failure with hypoxia: Secondary | ICD-10-CM | POA: Diagnosis not present

## 2018-08-16 LAB — URINALYSIS, ROUTINE W REFLEX MICROSCOPIC
Bacteria, UA: NONE SEEN
Bilirubin Urine: NEGATIVE
Glucose, UA: >=500 mg/dL — AB
Ketones, ur: NEGATIVE mg/dL
Leukocytes, UA: NEGATIVE
Nitrite: NEGATIVE
Protein, ur: NEGATIVE mg/dL
Specific Gravity, Urine: 1.037 — ABNORMAL HIGH (ref 1.005–1.030)
pH: 5 (ref 5.0–8.0)

## 2018-08-16 LAB — CBC
HCT: 41.5 % (ref 39.0–52.0)
Hemoglobin: 13 g/dL (ref 13.0–17.0)
MCH: 26.7 pg (ref 26.0–34.0)
MCHC: 31.3 g/dL (ref 30.0–36.0)
MCV: 85.4 fL (ref 80.0–100.0)
Platelets: 175 10*3/uL (ref 150–400)
RBC: 4.86 MIL/uL (ref 4.22–5.81)
RDW: 13.2 % (ref 11.5–15.5)
WBC: 7.6 10*3/uL (ref 4.0–10.5)
nRBC: 0 % (ref 0.0–0.2)

## 2018-08-16 LAB — BASIC METABOLIC PANEL
Anion gap: 11 (ref 5–15)
BUN: 14 mg/dL (ref 6–20)
CALCIUM: 8.9 mg/dL (ref 8.9–10.3)
CO2: 25 mmol/L (ref 22–32)
Chloride: 97 mmol/L — ABNORMAL LOW (ref 98–111)
Creatinine, Ser: 0.78 mg/dL (ref 0.61–1.24)
GFR calc Af Amer: >60 mL/min (ref >60)
GFR calc non Af Amer: >60 mL/min (ref >60)
Glucose, Bld: 246 mg/dL — ABNORMAL HIGH (ref 70–99)
Potassium: 3.9 mmol/L (ref 3.5–5.1)
Sodium: 133 mmol/L — ABNORMAL LOW (ref 135–145)

## 2018-08-16 LAB — CBG MONITORING, ED: Glucose-Capillary: 308 mg/dL — ABNORMAL HIGH (ref 70–99)

## 2018-08-16 LAB — TROPONIN I: Troponin I: <0.03 ng/mL (ref <0.03)

## 2018-08-16 MED ORDER — DIPHENHYDRAMINE HCL 50 MG/ML IJ SOLN
25.0000 mg | Freq: Once | INTRAMUSCULAR | Status: DC
  Filled 2018-08-16: qty 1

## 2018-08-16 MED ORDER — PREDNISONE 10 MG PO TABS: 20.0000 mg | ORAL_TABLET | Freq: Every day | ORAL | 0 refills | Status: DC

## 2018-08-16 MED ORDER — DIPHENHYDRAMINE HCL 25 MG PO CAPS
25.0000 mg | ORAL_CAPSULE | Freq: Once | ORAL | Status: AC
  Administered 2018-08-16: 25 mg via ORAL
  Filled 2018-08-16: qty 1

## 2018-08-16 NOTE — Patient Outreach (Signed)
[facility]) Care Management  08/16/2018  Hart Haas [facility] 25-Apr-1961 [id number]   Telephone Screen  Referral Date:08/16/2018 Referral Source: [facility] Dept. Referral Reason: "member states his insulin is expensive-unsure of which one, member blind so wife prepares meds night before and places them in a rolled napkin for daily use" Insurance: [employer]   Outreach attempt # 1 to patient. No answer. RN CM left HIPAA compliant voicemail message along with contact info.     Plan: RN CM will make outreach attempt to patient within 3-4 business days. RN CM will send unsuccessful outreach letter to patient.   Ryan Montgomery, RN,BSN,CCM [place] Management Telephonic Care Management Coordinator Direct Phone: [phone] Toll Free: [phone] Fax: [phone]

## 2018-08-16 NOTE — ED Notes (Signed)
Checked on pt for urine sample,patient can't go right now,will check back in 30 minutes.

## 2018-08-16 NOTE — Discharge Instructions (Addendum)
We saw in the ER for blood sugar elevation and also your vision changes.  I spoke with Dr. Linus Salmons, infectious disease doctor.  He wants you to stop taking the [facility] and for you to call the eye specialist for an appointment.  He wants you to set up an appointment with him anytime after the eye specialist appointment.  See your primary care doctor for your blood sugar management.

## 2018-08-16 NOTE — ED Triage Notes (Signed)
Pt c/o elevated blood sugar that started yesterday with sob

## 2018-08-17 ENCOUNTER — Other Ambulatory Visit: Payer: Self-pay

## 2018-08-17 NOTE — Patient Outreach (Signed)
[facility] [facility]) Care Management  [place]   08/17/2018  Ryan [name] 1961/01/18 [id number]  Reason for referral: Medication Assistance with  insulins and polypharmacy medication review.  Referral source: [facility] Current insurance:[employer]  PMHx includes but not limited to:   ASCVD, atrial fibrillation, asthma, COPD Gold II/III, pulmonary fibrosis, GERD with stricture, type 2 diabetes mellitus, mycobacterium avium and hyperlipidemia  Outreach:  Successful telephone call with Ryan [name] wife, Ryan [name].  HIPAA identifiers verified.   Subjective:  Wife reports that he husband went to the ED yesterday for "mouth swelling".  She states that he received a steroid injection at the PCP's office due to questionable allergic reaction.  She states his glucose has been elevated "staying in the the 300s" lately and she doesn't know why.  Wife states that his dose of Lantus was increased at yesterday's visit.  She states he has been on prednisone since last April.  She states that they have trouble affording his insulins, especially when he goes into the coverage gap.   Objective: Lab Results  Component Value Date   CREATININE 0.78 08/16/2018   CREATININE 1.00 08/12/2018   CREATININE 0.94 06/27/2018    Lab Results  Component Value Date   HGBA1C 8.7 (H) 11/28/2017    Lipid Panel     Component Value Date/Time   CHOL 131 06/23/2011 0555   TRIG 182 (H) 07/26/2015 0440   TRIG 79 05/17/2010 0000   HDL 29 (L) 06/23/2011 0555   CHOLHDL 4.5 06/23/2011 0555   VLDL 17 06/23/2011 0555   LDLCALC 85 06/23/2011 0555   LDLCALC 101 05/17/2010 0000    BP Readings from Last 3 Encounters:  08/16/18 (!) 139/111  08/12/18 131/83  08/04/18 (!) 148/85    Allergies  Allergen Reactions  . Albuterol Palpitations  . 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 Influenza Vaccine Live Swelling     Medications Reviewed Today    Reviewed by Ryan [name], [name] (Pharmacist) on 08/17/18 at 1542  Med List Status: <None>  Medication Order Taking? Sig Documenting Provider Last Dose Status Informant  albuterol (VENTOLIN HFA) 108 (90 BASE) MCG/ACT inhaler [id number] Yes Inhale 2 puffs into the lungs every 6 (six) hours as needed for wheezing or shortness of breath. Provider, Historical, [name] Taking Active Self           Med Note Ryan [name]   Sat Oct 03, 2017 10:23 AM)    azithromycin (ZITHROMAX) 500 MG tablet [id number] Yes Take 500 mg by mouth daily. Provider, Historical, [name] Taking Active Self           Med Note Ryan [name]   Wed Apr 28, 2018 10:29 AM) Long term medication  baclofen (LIORESAL) 20 MG tablet [id number] Yes Take 20 mg by mouth 3 (three) times daily.  Provider, Historical, [name] Taking Active Self           Med Note Ryan [name] Jul 15, 2015  5:15 PM)    benzonatate (TESSALON) 200 MG capsule [id number] Yes Take 200 mg by mouth 3 (three) times daily. Provider, Historical, [name] Taking Active Self  budesonide (PULMICORT) 0.25 MG/2ML nebulizer solution [id number] Yes Take 2 mLs (0.25 mg total) by nebulization 2 (two) times daily. Ryan [name] Taking Active Self  CARTIA XT 120 MG 24 hr capsule [id number] Yes Take 1 capsule (120 mg total) by mouth daily. Ryan [name],  Ryan Guild, [name] Taking Active Self  diphenhydrAMINE (BENADRYL) 25 mg capsule [id number] Yes Take 25 mg by mouth every 8 (eight) hours as needed. Felt mouth swelling at ED visit 08/16/18. Provider, Historical, [name] Taking Active   fexofenadine (ALLEGRA) 180 MG tablet [id number] Yes Take 180 mg by mouth daily. Provider, Historical, [name] Taking Active Self  fluticasone (FLONASE) 50 MCG/ACT nasal spray [id number] Yes Place 2 sprays into both nostrils daily as needed.  Provider, Historical, [name] Taking Active Self  Fluticasone-Umeclidin-Vilant (TRELEGY ELLIPTA) 100-62.5-25 MCG/INH AEPB [id number] No Inhale 1 puff into  the lungs daily. Provider, Historical, [name] Unknown Active Self  insulin glargine (LANTUS) 100 UNIT/ML injection [id number] Yes Inject 0.24 mLs (24 Units total) into the skin daily. 24 units daily  Patient taking differently:  Inject 32 Units into the skin daily.    Ryan [name] Taking Active Self  insulin lispro (HUMALOG) 100 UNIT/ML injection [id number] Yes Take by sliding scale provided by [name] maximum amount daily is 60 units.  Check blood sugar 4 times a day  Patient taking differently:  Inject 0-15 Units into the skin 3 (three) times daily with meals. Take by sliding scale provided by [name] maximum amount daily is 60 units.  Check blood sugar 4 times a day   Ryan [name] Taking Active Self           Med Note Ryan [name] Aug 12, 2018  4:41 PM)    ipratropium (ATROVENT) 0.02 % nebulizer solution [id number] Yes Take 0.5 mg by nebulization 2 (two) times daily.  Provider, Historical, [name] Taking Active Self           Med Note Ryan [name]   Wed Apr 28, 2018 10:35 AM) Used with the levalbuterol  levalbuterol (XOPENEX) 0.63 MG/3ML nebulizer solution [id number] Yes Take 0.63 mg by nebulization 2 (two) times daily. Provider, Historical, [name] Taking Active Self           Med Note Ryan [name]   Wed Apr 28, 2018 10:36 AM) Used with the ipratropium  LORazepam (ATIVAN) 0.5 MG tablet [id number] Yes Take 1 tablet (0.5 mg total) by mouth 2 (two) times daily as needed for anxiety or sleep.  Patient taking differently:  Take 0.5 mg by mouth 4 (four) times daily. Pt takes it three times daily.   Ryan Ford, DO Taking Active Self  metFORMIN (GLUCOPHAGE) 500 MG tablet [id number] Yes Take 1 tablet (500 mg total) by mouth daily with breakfast. Ryan Alberts, [name] Taking Active Self  nitroGLYCERIN (NITROSTAT) 0.4 MG SL tablet [id number] No Place 1 tablet (0.4 mg total) under the tongue every 5 (five) minutes as needed for chest pain.  Patient not taking:  Reported on 08/17/2018    Ryan Colonel, NP Not Taking Active Self           Med Note Ouida Sills Sep 15, 2017 12:20 PM)    omeprazole (PRILOSEC) 20 MG capsule [id number] Yes Take 1 capsule (20 mg total) by mouth 2 (two) times daily before a meal. Annitta Needs, NP Taking Active Self  oxyCODONE-acetaminophen (PERCOCET) 10-325 MG tablet [id number] Yes Take 1 tablet by mouth every 4 (four) hours as needed for pain. Provider, Historical, [name] Taking Active Self  predniSONE (DELTASONE) 10 MG tablet [id number] Yes Take 2 daily for 1 week then 1 daily  Patient taking differently:  Take 20 mg by mouth daily with breakfast. Takes 2 tablets daily.   Ryan [name]  Taking Active Self  predniSONE (DELTASONE) 10 MG tablet [id number] Yes Take 2 tablets (20 mg total) by mouth daily. Varney Biles, [name] Taking Active   rifampin (RIFADIN) 300 MG capsule [id number] Yes Take 2 capsules (600 mg total) by mouth daily. Thayer Headings, [name] Taking Active Self  rosuvastatin (CRESTOR) 10 MG tablet [id number] Yes Take 10 mg by mouth every morning.  Provider, Historical, [name] Taking Active Self          Assessment:  Drugs sorted by system:  Neurologic/Psychologic:lorazepam  Cardiovascular: diltiazem, nitroglycerin, rosuvastatin  Pulmonary/Allergy: albuterol MDI, benzonatate, budesonide neb,  Diphenhydramine, fexofenadine, fluticasone nasal, ? Fluticasone/umeclidin/vilant, ipratropium neb, levalbuterol neb, prednisone  Gastrointestinal: omerprazole  Endocrine: Lantus, Humalog  Pain: oxycodone/APAP  Infectious Diseases: azithromycin, rifampin  Miscellaneous: baclofen  Medication Review Findings:  . Trelegy Ellipta- on profile, but family unsure if he was taking.   Will clarify with PCP.  . Diphenhydramine-patinet taking for potential allergic reaction yesterday.   This is a short term medication.  He usually takes fexofenadine.  . Patient with diabetes and not on an ACE ARB. May provide nephorprotction.    Medication Assistance Findings:  Medication assistance needs identified.   Extra Help:   _0  Already receiving Full Extra Help  _1  Already receiving Partial Extra Help  _2  Eligible based on reported income and assets  _3  Not Eligible based on reported income and assets Unable to complete the online application.  Wife states she was going to the [place] office about her son this week, so she will ask them about applying for Extra Help LIS.  Patient Assistance Programs: 1) Humalog made by [employer] o Income requirement met: _4  Yes _5  No _6  Unknown o Out-of-pocket prescription expenditure met:    _7  Yes _8  No  _9  Unknown  <OTLXBWIOMBTDHRCB>_6<\/LAGTXMIWOEHOZYYQ>_82  Not applicable - Patient has met application requirements to apply for this patient assistance program.          2)  Proventil HFA made by [employer] o Income requirement met: _11  Yes _12  No  _13  Unknown o Out-of-pocket prescription expenditure met:   _14  Yes _15  No   _16  Unknown <NOIBBCWUGQBVQXIH>_0<\/TUUEKCMKLKJZPHXT>_05  Not applicable - Patient has met application requirements to apply for this patient assistance program.           3)  Lantus made by [employer] o Income requirement met: _18  Yes _19  No  _20  Unknown o Out-of-pocket prescription expenditure met:   _21  Yes _22  No   _23  Unknown <WPVXYIAXKPVVZSMO>_7<\/MBEMLJQGBEEFEOFH>_21  Not applicable - Patient has not met application requirements to apply for this patient assistance program.   He will need to spend 2% of household income to be eligible.    Plan: I will route patient assistance letter to [place] technician who will coordinate patient assistance program application process for medications listed above.  [facility] pharmacy technician will assist with obtaining all required documents from both patient and provider(s) and submit application(s) once completed.    Outreach to Dr. Luan Pulling to clarify Trelegy.  Follow up with wife on Monday to see if she went to the [employer] office.   Joetta Manners, PharmD Clinical Pharmacist [place] [phone]

## 2018-08-17 NOTE — Patient Outreach (Signed)
[facility]) Care Management  08/17/2018  Ryan [name] [facility] 1961-04-18 [id number]   Telephone Screen  Referral Date:08/16/2018 Referral Source: [facility] Dept. Referral Reason: "member states his insulin is expensive-unsure of which one, member blind so wife prepares meds night before and places them in a rolled napkin for daily use" Insurance: [employer]   Incoming call from patient's spouse-Sherry(DPR on file). She reports that due to patient's visual impairments he does not answer and handle his calls. Spouse has requested that all [facility] communication be through her at her number [phone]. Patient resides in his home along with his spouse. She reports that he requires assistance with all ADLs/IADLs. Spouse takes patient to appts. Although patient has some visual impairments she voices that he has not had any falls and does not use any assistive devices.   Conditions:  Per chart review, patient has PMH of, but not limited to: COPD,CAD,DM,A-fib, chronic back pain, GERD, HLD, smoker and seizures.Spouse voices that patient has had Diabetes for some time. They are monitoring cbgs 4x/day in the home and voices that they run around the 200s to 300s. Spouse attributes this to patient's long term Prednisone usage.Of note, patient has had several recent ED visits related to hyperglycemia. RN CM offered further nursing education/support in managing Diabetes but she declined. She voiced that they have it under control and know what to do. Last A1C on file 8.7(May 2019).   Medications: Spouse reports that patient is taking about 20 meds. She voices that his most expensive meds are his two insulins-Humalog and Lantus. She reports that Humalog costs her abut $141.00  and Lantus costs about $95.00. Patient is currently not out of any meds. Spouse assists patient with managing/taking meds by placing them in "napkin" for him to take.   Appointments: Spouse reports patient saw PCP on yesterday and had  Lantus dosage increased to improve cbgs.  Consent: [facility] services reviewed and discussed with spouse. Verbal consent for services given.  Plan: RN CM will send [place] referral for polypharmacy med review, possible med assistance and med mgmt.   [name], RN,BSN,CCM [place] Management Telephonic Care Management Coordinator Direct Phone: [phone] Toll Free: [phone] Fax: [phone]

## 2018-08-18 ENCOUNTER — Ambulatory Visit: Payer: Self-pay

## 2018-08-18 ENCOUNTER — Other Ambulatory Visit: Payer: Self-pay | Admitting: Pharmacy Technician

## 2018-08-18 NOTE — ED Provider Notes (Signed)
[facility] EMERGENCY DEPARTMENT Provider Note   CSN: [id number] Arrival date & time: 08/16/18  0146     History   Chief Complaint Chief Complaint  Patient presents with  . Hyperglycemia    HPI Ryan [name] is a 59 y.o. male.  HPI 58 year old male with history of A. fib, CAD, diabetes, pulmonary MA infection comes into the ER with chief complaint of elevated blood sugar and vision changes.  Patient states that his blood sugar was coming back over 500 at home and after taking sliding scale insulin his blood sugar was still over 400 so he came to the ER.  He denies any recent illness-like symptoms, specifically has no new cough, chest pain, shortness of breath, abdominal pain, UTI-like symptoms, rash.  Patient denies any medication changes.  Additionally he reports that he has been having vision changes and therefore he stopped taking his Ethambutol.  Patient was prescribed this medication and is supposed to be taking it long-term because of his pulmonary MAI.  However 1 of the listed side effect is vision disturbance, and he was advised to stop medicine if he starts having any vision changes.  He has not called eye specialist or ID doctor yet.  Finally at the time of discharge when I discussed the findings and results with the patient he informed me that he also had mild lip swelling.  No history of allergies besides albuterol, Cipro and he denies any new exposures.  Past Medical History:  Diagnosis Date  . Atrial fibrillation ([place])    Not anticoagulated  . Barrett's esophagus   . Borderline diabetes   . Bronchitis   . CAP (community acquired pneumonia) 07/10/2013   04/2015  . 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 Chronic back pain   . Chronic left shoulder pain   . Chronic neck pain   . Chronic respiratory failure ([place])   . Collagen vascular disease ([place])   . COPD (chronic obstructive pulmonary disease) ([place])   . Coronary  atherosclerosis of native coronary artery    Mild nonobstructive disease at catheterization 2007  . DDD (degenerative disc disease)    Cervical and thoracic  . GERD (gastroesophageal reflux disease)   . History of pneumonia   . Hypercholesteremia   . Polycythemia   . Seizures ([place])    Last seizure 2 yrs ago  . Smoker   . Type 2 diabetes mellitus ([place])   . Type 2 diabetes mellitus without complication [facility])     Patient Active Problem List   Diagnosis Date Noted  . GERD (gastroesophageal reflux disease) 08/04/2018  . Chest pain 06/20/2018  . Left-sided chest pain 06/19/2018  . Dysphagia 04/26/2018  . Vision disorder 12/30/2017  . Umbilical hernia 40/34/7425  . Lower extremity edema 10/17/2017  . 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 Anemia of chronic disease   . Elevated liver enzymes   . Absolute anemia   . Pulmonary fibrosis ([place]) 05/10/2015  . Chronic respiratory failure with hypoxia ([place]) 05/10/2015  . Pulmonary nodule   . Pain, generalized 05/01/2015  . Laceration 05/01/2015  . Protein-calorie malnutrition, severe ([place]) 10/21/2013  . Hemoptysis 10/20/2013  . Atrial fibrillation with rapid ventricular response ([place]) 07/10/2013  . Rotator cuff syndrome of left shoulder 01/20/2012  . Spondylosis, cervical 11/19/2011  . 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 Cavitary lesion of lung 05/07/2011  . GERD with stricture   . ASCVD (arteriosclerotic cardiovascular disease)   .  Type 2 diabetes mellitus without complication ([place])   . TOBACCO ABUSE 05/17/2010  . Asthma 01/20/2008  . Hyperlipidemia 12/27/2007  . 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 COLONOSCOPY  2012   Dr. Posey Pronto: normal  . ESOPHAGOGASTRODUODENOSCOPY N/A 05/04/2015   Dr. Michail Sermon: minimal erosive esophagitis, small hiatal hernia. FOOD PRECLUDED A COMPLETE EXAM. No biopsies taken  .  ESOPHAGOGASTRODUODENOSCOPY  2011   [place]: Barrett's   . ESOPHAGOGASTRODUODENOSCOPY (EGD) WITH PROPOFOL N/A 05/06/2018   Dr. Gala Romney: erosive reflux esophagitis and +candida, Schatzki's ring s/p dilation, hiatal hernia.  [name] LUNG BIOPSY    . MALONEY DILATION N/A 05/06/2018   Procedure: Venia Minks DILATION;  Surgeon: Daneil Dolin, MD;  Location: [facility] ENDO SUITE;  Service: Endoscopy;  Laterality: N/A;  . Throat biopsy    . VASECTOMY    . VASECTOMY  1987        Home Medications    Prior to Admission medications   Medication Sig Start Date End Date Taking? Authorizing Provider  albuterol (VENTOLIN HFA) 108 (90 BASE) MCG/ACT inhaler Inhale 2 puffs into the lungs every 6 (six) hours as needed for wheezing or shortness of breath.    [provider]  azithromycin (ZITHROMAX) 500 MG tablet Take 500 mg by mouth daily.    [provider]  baclofen (LIORESAL) 20 MG tablet Take 20 mg by mouth 3 (three) times daily.  06/05/15   [provider]  benzonatate (TESSALON) 200 MG capsule Take 200 mg by mouth 3 (three) times daily.    [provider]  budesonide (PULMICORT) 0.25 MG/2ML nebulizer solution Take 2 mLs (0.25 mg total) by nebulization 2 (two) times daily. 10/24/17   [name], MD  CARTIA XT 120 MG 24 hr capsule Take 1 capsule (120 mg total) by mouth daily. 01/01/18   Arnoldo Lenis, MD  diphenhydrAMINE (BENADRYL) 25 mg capsule Take 25 mg by mouth every 8 (eight) hours as needed. Felt mouth swelling at ED visit 08/16/18.    [provider]  fexofenadine (ALLEGRA) 180 MG tablet Take 180 mg by mouth daily.    [provider]  fluticasone (FLONASE) 50 MCG/ACT nasal spray Place 2 sprays into both nostrils daily as needed.  02/24/18   [provider]  Fluticasone-Umeclidin-Vilant (TRELEGY ELLIPTA) 100-62.5-25 MCG/INH AEPB Inhale 1 puff into the lungs daily.    [provider]  insulin glargine (LANTUS) 100 UNIT/ML injection Inject  0.24 mLs (24 Units total) into the skin daily. 24 units daily Patient taking differently: Inject 32 Units into the skin daily.  10/24/17   [name], MD  insulin lispro (HUMALOG) 100 UNIT/ML injection Take by sliding scale provided by hospital maximum amount daily is 60 units.  Check blood sugar 4 times a day Patient taking differently: Inject 0-15 Units into the skin 3 (three) times daily with meals. Take by sliding scale provided by hospital maximum amount daily is 60 units.  Check blood sugar 4 times a day 09/23/17   [name], MD  ipratropium (ATROVENT) 0.02 % nebulizer solution Take 0.5 mg by nebulization 2 (two) times daily.  09/29/17   [provider]  levalbuterol Penne Lash) 0.63 MG/3ML nebulizer solution Take 0.63 mg by nebulization 2 (two) times daily.    [provider]  LORazepam (ATIVAN) 0.5 MG tablet Take 1 tablet (0.5 mg total) by mouth 2 (two) times daily as needed for anxiety or sleep.  Patient taking differently: Take 0.5 mg by mouth 4 (four) times daily. Pt takes it three times daily. 05/04/15   Cristal Ford, DO  metFORMIN (GLUCOPHAGE) 500 MG tablet Take 1 tablet (500 mg total) by mouth daily with breakfast. 09/15/14   Rexene Alberts, MD  nitroGLYCERIN (NITROSTAT) 0.4 MG SL tablet Place 1 tablet (0.4 mg total) under the tongue every 5 (five) minutes as needed for chest pain. Patient not taking: Reported on 08/17/2018 04/22/13   Lendon Colonel, NP  omeprazole (PRILOSEC) 20 MG capsule Take 1 capsule (20 mg total) by mouth 2 (two) times daily before a meal. 08/04/18   Annitta Needs, NP  oxyCODONE-acetaminophen (PERCOCET) 10-325 MG tablet Take 1 tablet by mouth every 4 (four) hours as needed for pain.    [provider]  predniSONE (DELTASONE) 10 MG tablet Take 2 daily for 1 week then 1 daily Patient taking differently: Take 20 mg by mouth daily with breakfast. Takes 2 tablets daily. 10/24/17   [name], MD  predniSONE (DELTASONE) 10 MG tablet  Take 2 tablets (20 mg total) by mouth daily. 08/16/18   Varney Biles, MD  rifampin (RIFADIN) 300 MG capsule Take 2 capsules (600 mg total) by mouth daily. 11/17/17   Comer, Okey Regal, MD  rosuvastatin (CRESTOR) 10 MG tablet Take 10 mg by mouth every morning.  04/10/18   [provider]    Family History Family History  Problem Relation Age of Onset  . Hypertension Mother   . Diabetes Mother   . Heart attack Mother   . Heart failure Mother   . Hypertension Sister   . Diabetes Sister   . Heart failure Sister   . Colon cancer Neg Hx     Social History Social History   Tobacco Use  . Smoking status: Former Smoker    Packs/day: 0.50    Years: 45.00    Pack years: 22.50    Types: Cigarettes    Last attempt to quit: 05/01/2015    Years since quitting: 3.3  . Smokeless tobacco: Never Used  Substance Use Topics  . Alcohol use: No    Alcohol/week: 0.0 standard drinks  . Drug use: No     Allergies   Albuterol; Ciprofloxacin; and Influenza vaccine live   Review of Systems Review of Systems  Constitutional: Positive for activity change.  Respiratory: Negative for shortness of breath.   Cardiovascular: Negative for chest pain.  Skin: Positive for rash.  Allergic/Immunologic: Negative for immunocompromised state.  All other systems reviewed and are negative.    Physical Exam Updated Vital Signs BP (!) 139/111   Pulse 74   Temp 98.2 F (36.8 C) (Oral)   Resp 14   Ht 5\' 8"  (1.727 m)   Wt 78 kg   SpO2 100%   BMI 26.15 kg/m   Physical Exam Vitals signs and nursing note reviewed.  Constitutional:      Appearance: He is well-developed.  HENT:     Head: Normocephalic and atraumatic.  Eyes:     Pupils: Pupils are equal, round, and reactive to light.  Neck:     Musculoskeletal: Neck supple.  Cardiovascular:     Rate and Rhythm: Normal rate.  Pulmonary:     Effort: Pulmonary effort is normal.  Abdominal:     Palpations: Abdomen is soft.     Tenderness:  There is no abdominal tenderness.  Skin:    General: Skin is warm.  Neurological:     Mental Status: He  is alert and oriented to person, place, and time.      ED Treatments / Results  Labs (all labs ordered are listed, but only abnormal results are displayed) Labs Reviewed  BASIC METABOLIC PANEL - Abnormal; Notable for the following components:      Result Value   Sodium 133 (*)    Chloride 97 (*)    Glucose, Bld 246 (*)    All other components within normal limits  URINALYSIS, ROUTINE W REFLEX MICROSCOPIC - Abnormal; Notable for the following components:   Specific Gravity, Urine 1.037 (*)    Glucose, UA >=500 (*)    Hgb urine dipstick SMALL (*)    All other components within normal limits  CBG MONITORING, ED - Abnormal; Notable for the following components:   Glucose-Capillary 308 (*)    All other components within normal limits  CBC  TROPONIN I    EKG EKG Interpretation  Date/Time:  Monday August 16 2018 02:09:14 EST Ventricular Rate:  93 PR Interval:  134 QRS Duration: 84 QT Interval:  346 QTC Calculation: 430 R Axis:   63 Text Interpretation:  Normal sinus rhythm Normal ECG No acute changes No significant change since last tracing Confirmed by Varney Biles ([id number]) on 08/16/2018 7:29:13 AM   Radiology No results found.  Procedures Procedures (including critical care time)  Medications Ordered in ED Medications  diphenhydrAMINE (BENADRYL) capsule 25 mg (25 mg Oral Given 08/16/18 8527)     Initial Impression / Assessment and Plan / ED Course  I have reviewed the triage vital signs and the nursing notes.  Pertinent labs & imaging results that were available during my care of the patient were reviewed by me and considered in my medical decision making (see chart for details).     58 year old male comes in with chief complaint of elevated blood sugar.  With this elevated blood sugar he does not have any evidence of DKA.  It appears that patient's blood  sugars have been labile, and he has been advised to follow-up with his PCP for that.  He does not have any underlying infectious symptoms for [place].  Additionally, patient is also complaining of vision changes.  He is on ethambutol.  Vision changes are binocular, unlikely to be stroke because there is no other focal neurologic deficits.  I have discussed the case with infectious disease doctor, and Dr. De Burrs recommends that patient stop the medicine and follow-up with ophthalmology first and then with him.  Patient has been informed of this discussion and he will make appropriate calls to get appointment.  Finally at the time of discharge patient complained of lip swelling.  On my exam there is no gross lip swelling and at no point did he have any oral mucosal edema or difficulty in breathing.  We will give him oral Benadryl.  Patient is already on prednisone, we have advised him to just increase his prednisone for the next 3 days.  Strict ER return precautions discussed for severe allergic reaction.  Final Clinical Impressions(s) / ED Diagnoses   Final diagnoses:  Hyperglycemia  Vision changes    ED Discharge Orders         Ordered    predniSONE (DELTASONE) 10 MG tablet  Daily     08/16/18 0755           Varney Biles, MD 08/18/18 1916

## 2018-08-18 NOTE — Patient Outreach (Signed)
[facility]) Care Management  08/18/2018  Amay Mijangos Labrada 1960/11/04 [id number]                                                  Medication Assistance Referral  Referral From: [facility] RPh Clearnce Sorrel.  Medication/Company: Humalog / [employer] Patient application portion:  Mailed  Medication/Company: Proventil / [employer] Patient application portion:  Mailed   Also mailed patient [employer] application in case 1 of his current medications is switched to B-I product.  Will fax and mail provider portions of applications to Dr. Luan Pulling once Anderson Malta has conformation from Dr. Luan Pulling on medications to apply for.  Follow up:  Will follow up with patient in 5-7 business days to confirm application(s) have been received.  Maud Deed Chana Bode [place] Certified Pharmacy Technician [place] Management Direct [phone]

## 2018-08-23 ENCOUNTER — Ambulatory Visit: Payer: Self-pay

## 2018-08-23 ENCOUNTER — Other Ambulatory Visit: Payer: Self-pay

## 2018-08-23 NOTE — Patient Outreach (Signed)
[facility]) Care Management  08/23/2018  Ryan [name] Feb 10, 1961 [id number]  Successful outreach to Ryan [name] wife, Ryan [name].  HIPAA identifiers verified.   Medication Assistance: Successfully completed the [employer] online application for Ryan [name] today.  Wife had SSN incorrect during our last attempt.  Informed wife to expect the patient assistance applications in the mail soon.    Plan: Call placed to Dr. Luan Pulling' office to clarify if patient should be on Trelegy Ellipta?  Will follow patient assistance with [facility] CPhT.  Joetta Manners, PharmD Clinical Pharmacist [place] [phone]  Addendum: Incoming call received from [employer] at Dr. Luan Pulling' office.  The doctor wants Ryan [name] on Trelegy Ellipta.  Informed Olivia Mackie that I was not sure he could afford this inhaler and that he will need to spend $600 out of pocket on prescriptions before he can apply for patient assistance.  She states they have samples that he can pick up.  Will inform Ryan [name] that he needs to go pick up the samples.   Joetta Manners, PharmD Clinical Pharmacist [place] [phone]

## 2018-08-24 DIAGNOSIS — J449 Chronic obstructive pulmonary disease, unspecified: Secondary | ICD-10-CM | POA: Diagnosis not present

## 2018-08-26 ENCOUNTER — Emergency Department (HOSPITAL_COMMUNITY): Payer: Medicare Other

## 2018-08-26 ENCOUNTER — Other Ambulatory Visit: Payer: Self-pay

## 2018-08-26 ENCOUNTER — Inpatient Hospital Stay (HOSPITAL_COMMUNITY)
Admission: EM | Admit: 2018-08-26 | Discharge: 2018-09-10 | DRG: 193 | Disposition: A | Discharge status: Home Health | Payer: Medicare Other | Attending: Pulmonary Disease | Admitting: Pulmonary Disease

## 2018-08-26 ENCOUNTER — Encounter (HOSPITAL_COMMUNITY): Payer: Self-pay

## 2018-08-26 DIAGNOSIS — R32 Unspecified urinary incontinence: Secondary | ICD-10-CM | POA: Diagnosis not present

## 2018-08-26 DIAGNOSIS — R069 Unspecified abnormalities of breathing: Secondary | ICD-10-CM | POA: Diagnosis not present

## 2018-08-26 DIAGNOSIS — I480 Paroxysmal atrial fibrillation: Secondary | ICD-10-CM | POA: Diagnosis not present

## 2018-08-26 DIAGNOSIS — J440 Chronic obstructive pulmonary disease with acute lower respiratory infection: Secondary | ICD-10-CM | POA: Diagnosis not present

## 2018-08-26 DIAGNOSIS — M79671 Pain in right foot: Secondary | ICD-10-CM | POA: Diagnosis not present

## 2018-08-26 DIAGNOSIS — R079 Chest pain, unspecified: Secondary | ICD-10-CM | POA: Diagnosis not present

## 2018-08-26 DIAGNOSIS — I5032 Chronic diastolic (congestive) heart failure: Secondary | ICD-10-CM

## 2018-08-26 DIAGNOSIS — R000 Tachycardia, unspecified: Secondary | ICD-10-CM | POA: Diagnosis not present

## 2018-08-26 DIAGNOSIS — Z833 Family history of diabetes mellitus: Secondary | ICD-10-CM | POA: Diagnosis not present

## 2018-08-26 DIAGNOSIS — J9621 Acute and chronic respiratory failure with hypoxia: Secondary | ICD-10-CM | POA: Diagnosis present

## 2018-08-26 DIAGNOSIS — A310 Pulmonary mycobacterial infection: Secondary | ICD-10-CM | POA: Diagnosis not present

## 2018-08-26 DIAGNOSIS — J8410 Pulmonary fibrosis, unspecified: Secondary | ICD-10-CM | POA: Diagnosis present

## 2018-08-26 DIAGNOSIS — E876 Hypokalemia: Secondary | ICD-10-CM | POA: Diagnosis not present

## 2018-08-26 DIAGNOSIS — Z8249 Family history of ischemic heart disease and other diseases of the circulatory system: Secondary | ICD-10-CM

## 2018-08-26 DIAGNOSIS — T371X5S Adverse effect of antimycobacterial drugs, sequela: Secondary | ICD-10-CM | POA: Diagnosis not present

## 2018-08-26 DIAGNOSIS — Z7952 Long term (current) use of systemic steroids: Secondary | ICD-10-CM | POA: Diagnosis not present

## 2018-08-26 DIAGNOSIS — J449 Chronic obstructive pulmonary disease, unspecified: Secondary | ICD-10-CM

## 2018-08-26 DIAGNOSIS — E1165 Type 2 diabetes mellitus with hyperglycemia: Secondary | ICD-10-CM | POA: Diagnosis not present

## 2018-08-26 DIAGNOSIS — J189 Pneumonia, unspecified organism: Secondary | ICD-10-CM

## 2018-08-26 DIAGNOSIS — R0602 Shortness of breath: Secondary | ICD-10-CM | POA: Diagnosis not present

## 2018-08-26 DIAGNOSIS — J441 Chronic obstructive pulmonary disease with (acute) exacerbation: Secondary | ICD-10-CM | POA: Diagnosis not present

## 2018-08-26 DIAGNOSIS — M7989 Other specified soft tissue disorders: Secondary | ICD-10-CM | POA: Diagnosis not present

## 2018-08-26 DIAGNOSIS — I4891 Unspecified atrial fibrillation: Secondary | ICD-10-CM | POA: Diagnosis not present

## 2018-08-26 DIAGNOSIS — J1001 Influenza due to other identified influenza virus with the same other identified influenza virus pneumonia: Secondary | ICD-10-CM | POA: Diagnosis present

## 2018-08-26 DIAGNOSIS — J09X2 Influenza due to identified novel influenza A virus with other respiratory manifestations: Secondary | ICD-10-CM

## 2018-08-26 DIAGNOSIS — J9611 Chronic respiratory failure with hypoxia: Secondary | ICD-10-CM

## 2018-08-26 DIAGNOSIS — I493 Ventricular premature depolarization: Secondary | ICD-10-CM | POA: Diagnosis present

## 2018-08-26 DIAGNOSIS — Z9981 Dependence on supplemental oxygen: Secondary | ICD-10-CM

## 2018-08-26 DIAGNOSIS — H548 Legal blindness, as defined in USA: Secondary | ICD-10-CM | POA: Diagnosis present

## 2018-08-26 DIAGNOSIS — I5033 Acute on chronic diastolic (congestive) heart failure: Secondary | ICD-10-CM | POA: Diagnosis not present

## 2018-08-26 DIAGNOSIS — Z794 Long term (current) use of insulin: Secondary | ICD-10-CM | POA: Diagnosis not present

## 2018-08-26 DIAGNOSIS — E119 Type 2 diabetes mellitus without complications: Secondary | ICD-10-CM

## 2018-08-26 DIAGNOSIS — Z87891 Personal history of nicotine dependence: Secondary | ICD-10-CM

## 2018-08-26 DIAGNOSIS — Y95 Nosocomial condition: Secondary | ICD-10-CM | POA: Diagnosis present

## 2018-08-26 DIAGNOSIS — D649 Anemia, unspecified: Secondary | ICD-10-CM | POA: Diagnosis present

## 2018-08-26 DIAGNOSIS — R0689 Other abnormalities of breathing: Secondary | ICD-10-CM | POA: Diagnosis not present

## 2018-08-26 DIAGNOSIS — J984 Other disorders of lung: Secondary | ICD-10-CM

## 2018-08-26 LAB — CBC WITH DIFFERENTIAL/PLATELET
Abs Immature Granulocytes: 0.02 10*3/uL (ref 0.00–0.07)
Basophils Absolute: 0 10*3/uL (ref 0.0–0.1)
Basophils Relative: 0 %
Eosinophils Absolute: 0.1 10*3/uL (ref 0.0–0.5)
Eosinophils Relative: 2 %
HEMATOCRIT: 36.1 % — AB (ref 39.0–52.0)
Hemoglobin: 11.4 g/dL — ABNORMAL LOW (ref 13.0–17.0)
IMMATURE GRANULOCYTES: 0 %
Lymphocytes Relative: 17 %
Lymphs Abs: 1 10*3/uL (ref 0.7–4.0)
MCH: 27 pg (ref 26.0–34.0)
MCHC: 31.6 g/dL (ref 30.0–36.0)
MCV: 85.5 fL (ref 80.0–100.0)
Monocytes Absolute: 0.7 10*3/uL (ref 0.1–1.0)
Monocytes Relative: 11 %
NEUTROS PCT: 70 %
Neutro Abs: 4.1 10*3/uL (ref 1.7–7.7)
Platelets: 166 10*3/uL (ref 150–400)
RBC: 4.22 MIL/uL (ref 4.22–5.81)
RDW: 13.9 % (ref 11.5–15.5)
WBC: 5.8 10*3/uL (ref 4.0–10.5)
nRBC: 0 % (ref 0.0–0.2)

## 2018-08-26 LAB — COMPREHENSIVE METABOLIC PANEL
ALBUMIN: 3.2 g/dL — AB (ref 3.5–5.0)
ALT: 31 U/L (ref 0–44)
AST: 34 U/L (ref 15–41)
Alkaline Phosphatase: 49 U/L (ref 38–126)
Anion gap: 13 (ref 5–15)
BUN: 16 mg/dL (ref 6–20)
CALCIUM: 8.2 mg/dL — AB (ref 8.9–10.3)
CO2: 23 mmol/L (ref 22–32)
Chloride: 96 mmol/L — ABNORMAL LOW (ref 98–111)
Creatinine, Ser: 1.03 mg/dL (ref 0.61–1.24)
GFR calc Af Amer: >60 mL/min (ref >60)
GFR calc non Af Amer: >60 mL/min (ref >60)
Glucose, Bld: 221 mg/dL — ABNORMAL HIGH (ref 70–99)
Potassium: 3.9 mmol/L (ref 3.5–5.1)
SODIUM: 132 mmol/L — AB (ref 135–145)
Total Bilirubin: 0.5 mg/dL (ref 0.3–1.2)
Total Protein: 7 g/dL (ref 6.5–8.1)

## 2018-08-26 LAB — BRAIN NATRIURETIC PEPTIDE: B Natriuretic Peptide: 18 pg/mL (ref 0.0–100.0)

## 2018-08-26 LAB — TROPONIN I: Troponin I: <0.03 ng/mL (ref <0.03)

## 2018-08-26 LAB — LACTIC ACID, PLASMA: Lactic Acid, Venous: 1.7 mmol/L (ref 0.5–1.9)

## 2018-08-26 LAB — CBG MONITORING, ED: Glucose-Capillary: 231 mg/dL — ABNORMAL HIGH (ref 70–99)

## 2018-08-26 MED ORDER — LORAZEPAM 0.5 MG PO TABS
0.5000 mg | ORAL_TABLET | Freq: Four times a day (QID) | ORAL | Status: DC | PRN
  Administered 2018-08-26 – 2018-09-10 (×27): 0.5 mg via ORAL
  Filled 2018-08-26 (×27): qty 1

## 2018-08-26 MED ORDER — INSULIN GLARGINE 100 UNIT/ML ~~LOC~~ SOLN
25.0000 [IU] | Freq: Every day | SUBCUTANEOUS | Status: DC
  Administered 2018-08-27 – 2018-08-30 (×4): 25 [IU] via SUBCUTANEOUS
  Filled 2018-08-26 (×5): qty 0.25

## 2018-08-26 MED ORDER — INSULIN ASPART 100 UNIT/ML ~~LOC~~ SOLN
0.0000 [IU] | Freq: Three times a day (TID) | SUBCUTANEOUS | Status: DC
  Administered 2018-08-27: 11 [IU] via SUBCUTANEOUS
  Administered 2018-08-27: 5 [IU] via SUBCUTANEOUS
  Administered 2018-08-27 – 2018-08-28 (×4): 8 [IU] via SUBCUTANEOUS
  Administered 2018-08-29 (×3): 11 [IU] via SUBCUTANEOUS
  Administered 2018-08-30: 5 [IU] via SUBCUTANEOUS
  Administered 2018-08-30: 15 [IU] via SUBCUTANEOUS
  Administered 2018-08-30 – 2018-08-31 (×2): 11 [IU] via SUBCUTANEOUS
  Administered 2018-08-31: 15 [IU] via SUBCUTANEOUS
  Administered 2018-08-31 – 2018-09-01 (×2): 5 [IU] via SUBCUTANEOUS
  Administered 2018-09-01: 11 [IU] via SUBCUTANEOUS
  Administered 2018-09-01: 15 [IU] via SUBCUTANEOUS
  Administered 2018-09-02: 8 [IU] via SUBCUTANEOUS
  Administered 2018-09-02: 3 [IU] via SUBCUTANEOUS
  Administered 2018-09-02: 15 [IU] via SUBCUTANEOUS
  Administered 2018-09-03: 5 [IU] via SUBCUTANEOUS
  Administered 2018-09-03: 15 [IU] via SUBCUTANEOUS
  Administered 2018-09-03: 5 [IU] via SUBCUTANEOUS
  Administered 2018-09-04: 3 [IU] via SUBCUTANEOUS
  Administered 2018-09-04 (×2): 11 [IU] via SUBCUTANEOUS
  Administered 2018-09-05: 8 [IU] via SUBCUTANEOUS
  Administered 2018-09-05: 3 [IU] via SUBCUTANEOUS
  Administered 2018-09-05: 5 [IU] via SUBCUTANEOUS
  Administered 2018-09-06: 2 [IU] via SUBCUTANEOUS
  Administered 2018-09-06: 5 [IU] via SUBCUTANEOUS
  Administered 2018-09-06: 11 [IU] via SUBCUTANEOUS
  Administered 2018-09-07: 5 [IU] via SUBCUTANEOUS
  Administered 2018-09-08: 8 [IU] via SUBCUTANEOUS
  Administered 2018-09-08: 2 [IU] via SUBCUTANEOUS
  Administered 2018-09-08: 5 [IU] via SUBCUTANEOUS
  Administered 2018-09-09 (×2): 3 [IU] via SUBCUTANEOUS
  Administered 2018-09-09: 8 [IU] via SUBCUTANEOUS
  Administered 2018-09-10: 5 [IU] via SUBCUTANEOUS
  Administered 2018-09-10: 3 [IU] via SUBCUTANEOUS

## 2018-08-26 MED ORDER — FLUTICASONE FUROATE-VILANTEROL 100-25 MCG/INH IN AEPB
1.0000 | INHALATION_SPRAY | Freq: Every day | RESPIRATORY_TRACT | Status: DC
  Administered 2018-08-27 – 2018-09-10 (×15): 1 via RESPIRATORY_TRACT
  Filled 2018-08-26 (×2): qty 28

## 2018-08-26 MED ORDER — METHYLPREDNISOLONE SODIUM SUCC 125 MG IJ SOLR
60.0000 mg | Freq: Four times a day (QID) | INTRAMUSCULAR | Status: DC
  Administered 2018-08-26 – 2018-09-01 (×22): 60 mg via INTRAVENOUS
  Filled 2018-08-26 (×21): qty 2

## 2018-08-26 MED ORDER — PANTOPRAZOLE SODIUM 40 MG PO TBEC
40.0000 mg | DELAYED_RELEASE_TABLET | Freq: Every day | ORAL | Status: DC
  Administered 2018-08-27 – 2018-09-10 (×15): 40 mg via ORAL
  Filled 2018-08-26 (×15): qty 1

## 2018-08-26 MED ORDER — VANCOMYCIN HCL IN DEXTROSE 1-5 GM/200ML-% IV SOLN
1000.0000 mg | Freq: Once | INTRAVENOUS | Status: AC
  Administered 2018-08-26: 1000 mg via INTRAVENOUS
  Filled 2018-08-26: qty 200

## 2018-08-26 MED ORDER — LEVALBUTEROL HCL 0.63 MG/3ML IN NEBU
0.6300 mg | INHALATION_SOLUTION | Freq: Four times a day (QID) | RESPIRATORY_TRACT | Status: DC | PRN
  Administered 2018-08-26 – 2018-08-27 (×3): 0.63 mg via RESPIRATORY_TRACT
  Filled 2018-08-26 (×3): qty 3

## 2018-08-26 MED ORDER — IPRATROPIUM BROMIDE 0.02 % IN SOLN
0.5000 mg | Freq: Once | RESPIRATORY_TRACT | Status: AC
  Administered 2018-08-26: 0.5 mg via RESPIRATORY_TRACT

## 2018-08-26 MED ORDER — LEVALBUTEROL HCL 1.25 MG/0.5ML IN NEBU: 1.2500 mg | INHALATION_SOLUTION | RESPIRATORY_TRACT | Status: DC

## 2018-08-26 MED ORDER — OXYCODONE-ACETAMINOPHEN 10-325 MG PO TABS: 1.0000 | ORAL_TABLET | ORAL | Status: DC | PRN

## 2018-08-26 MED ORDER — SODIUM CHLORIDE 0.9 % IV SOLN
INTRAVENOUS | Status: DC | PRN
  Administered 2018-08-26 (×2): via INTRAVENOUS

## 2018-08-26 MED ORDER — UMECLIDINIUM BROMIDE 62.5 MCG/INH IN AEPB
1.0000 | INHALATION_SPRAY | Freq: Every day | RESPIRATORY_TRACT | Status: DC
  Administered 2018-08-27 – 2018-09-10 (×14): 1 via RESPIRATORY_TRACT
  Filled 2018-08-26 (×2): qty 7

## 2018-08-26 MED ORDER — LEVALBUTEROL HCL 1.25 MG/0.5ML IN NEBU
INHALATION_SOLUTION | RESPIRATORY_TRACT | Status: AC
  Administered 2018-08-26: 1.25 mg
  Filled 2018-08-26: qty 0.5

## 2018-08-26 MED ORDER — BACLOFEN 10 MG PO TABS
20.0000 mg | ORAL_TABLET | Freq: Three times a day (TID) | ORAL | Status: DC
  Administered 2018-08-26 – 2018-09-10 (×44): 20 mg via ORAL
  Filled 2018-08-26 (×44): qty 2

## 2018-08-26 MED ORDER — BENZONATATE 100 MG PO CAPS
200.0000 mg | ORAL_CAPSULE | Freq: Three times a day (TID) | ORAL | Status: DC
  Administered 2018-08-26 – 2018-09-10 (×44): 200 mg via ORAL
  Filled 2018-08-26 (×45): qty 2

## 2018-08-26 MED ORDER — SODIUM CHLORIDE 0.9 % IV SOLN
2.0000 g | Freq: Two times a day (BID) | INTRAVENOUS | Status: DC
  Administered 2018-08-27 – 2018-09-01 (×12): 2 g via INTRAVENOUS
  Filled 2018-08-26 (×14): qty 2

## 2018-08-26 MED ORDER — ROSUVASTATIN CALCIUM 10 MG PO TABS
10.0000 mg | ORAL_TABLET | Freq: Every day | ORAL | Status: DC
  Administered 2018-08-27 – 2018-09-09 (×14): 10 mg via ORAL
  Filled 2018-08-26 (×17): qty 1

## 2018-08-26 MED ORDER — ENOXAPARIN SODIUM 40 MG/0.4ML ~~LOC~~ SOLN
40.0000 mg | SUBCUTANEOUS | Status: DC
  Administered 2018-08-26 – 2018-09-09 (×15): 40 mg via SUBCUTANEOUS
  Filled 2018-08-26 (×15): qty 0.4

## 2018-08-26 MED ORDER — PIPERACILLIN-TAZOBACTAM 3.375 G IVPB 30 MIN
3.3750 g | Freq: Once | INTRAVENOUS | Status: AC
  Administered 2018-08-26: 3.375 g via INTRAVENOUS
  Filled 2018-08-26: qty 50

## 2018-08-26 MED ORDER — DILTIAZEM HCL ER COATED BEADS 120 MG PO CP24
120.0000 mg | ORAL_CAPSULE | Freq: Every day | ORAL | Status: DC
  Administered 2018-08-27 – 2018-09-10 (×15): 120 mg via ORAL
  Filled 2018-08-26 (×18): qty 1

## 2018-08-26 MED ORDER — VANCOMYCIN HCL IN DEXTROSE 1-5 GM/200ML-% IV SOLN
1000.0000 mg | Freq: Two times a day (BID) | INTRAVENOUS | Status: DC
  Administered 2018-08-27 – 2018-08-29 (×5): 1000 mg via INTRAVENOUS
  Filled 2018-08-26 (×5): qty 200

## 2018-08-26 MED ORDER — INSULIN ASPART 100 UNIT/ML ~~LOC~~ SOLN: 0.0000 [IU] | SUBCUTANEOUS | Status: DC

## 2018-08-26 MED ORDER — OXYCODONE-ACETAMINOPHEN 5-325 MG PO TABS
1.0000 | ORAL_TABLET | ORAL | Status: DC | PRN
  Administered 2018-08-26 – 2018-09-10 (×56): 1 via ORAL
  Filled 2018-08-26 (×57): qty 1

## 2018-08-26 MED ORDER — INSULIN ASPART 100 UNIT/ML ~~LOC~~ SOLN
0.0000 [IU] | Freq: Every day | SUBCUTANEOUS | Status: DC
  Administered 2018-08-26 – 2018-08-29 (×2): 2 [IU] via SUBCUTANEOUS
  Administered 2018-08-30: 3 [IU] via SUBCUTANEOUS
  Administered 2018-08-31 – 2018-09-03 (×3): 5 [IU] via SUBCUTANEOUS
  Administered 2018-09-04 – 2018-09-05 (×2): 3 [IU] via SUBCUTANEOUS
  Administered 2018-09-07: 5 [IU] via SUBCUTANEOUS
  Administered 2018-09-07: 4 [IU] via SUBCUTANEOUS
  Filled 2018-08-26: qty 1

## 2018-08-26 MED ORDER — IPRATROPIUM BROMIDE 0.02 % IN SOLN
0.5000 mg | Freq: Two times a day (BID) | RESPIRATORY_TRACT | Status: DC
  Administered 2018-08-26 – 2018-08-27 (×3): 0.5 mg via RESPIRATORY_TRACT
  Filled 2018-08-26 (×2): qty 2.5

## 2018-08-26 MED ORDER — OXYCODONE HCL 5 MG PO TABS
5.0000 mg | ORAL_TABLET | ORAL | Status: DC | PRN
  Administered 2018-08-27 – 2018-09-10 (×53): 5 mg via ORAL
  Filled 2018-08-26 (×54): qty 1

## 2018-08-26 MED ORDER — SODIUM CHLORIDE 0.9 % IV BOLUS
1000.0000 mL | Freq: Once | INTRAVENOUS | Status: AC
  Administered 2018-08-26: 1000 mL via INTRAVENOUS

## 2018-08-26 MED ORDER — FLUTICASONE-UMECLIDIN-VILANT 100-62.5-25 MCG/INH IN AEPB: 1.0000 | INHALATION_SPRAY | Freq: Every day | RESPIRATORY_TRACT | Status: DC

## 2018-08-26 MED ORDER — SODIUM CHLORIDE 0.9 % IV SOLN
INTRAVENOUS | Status: AC
  Administered 2018-08-26: 22:00:00 via INTRAVENOUS

## 2018-08-26 NOTE — ED Provider Notes (Signed)
[facility] EMERGENCY DEPARTMENT Provider Note   CSN: [id number] Arrival date & time: 08/26/18  1913     History   Chief Complaint Chief Complaint  Patient presents with  . Respiratory Distress    HPI Ryan [name] is a 58 y.o. male.  HPI Patient with history of COPD on 6 L of supplemental oxygen presents with 3 to 4 days of increasing shortness of breath.  States he has had a cough with some sputum production.  Also complains of diffuse abdominal distention and pain.  Denies any nausea or vomiting.  EMS arrived and found patient with O2 sats in the high 80s on his normal 6 L.  Was given Atrovent and Solu-Medrol in route.  Patient reportedly is unable to tolerate albuterol.  Patient was also started on CPAP. Past Medical History:  Diagnosis Date  . Atrial fibrillation ([place])    Not anticoagulated  . Barrett's esophagus   . Borderline diabetes   . Bronchitis   . CAP (community acquired pneumonia) 07/10/2013   04/2015  . 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 Chronic back pain   . Chronic left shoulder pain   . Chronic neck pain   . Chronic respiratory failure ([place])   . Collagen vascular disease ([place])   . COPD (chronic obstructive pulmonary disease) ([place])   . Coronary atherosclerosis of native coronary artery    Mild nonobstructive disease at catheterization 2007  . DDD (degenerative disc disease)    Cervical and thoracic  . GERD (gastroesophageal reflux disease)   . History of pneumonia   . Hypercholesteremia   . Polycythemia   . Seizures ([place])    Last seizure 2 yrs ago  . Smoker   . Type 2 diabetes mellitus ([place])   . Type 2 diabetes mellitus without complication [facility])     Patient Active Problem List   Diagnosis Date Noted  . HCAP (healthcare-associated pneumonia) 08/26/2018  . GERD (gastroesophageal reflux disease) 08/04/2018  . Chest pain 06/20/2018  . Left-sided chest pain 06/19/2018  . Dysphagia 04/26/2018  . Acute on  chronic respiratory failure with hypoxia ([place]) 04/20/2018  . Vision disorder 12/30/2017  . Umbilical hernia 30/01/6225  . Lower extremity edema 10/17/2017  . MAI (mycobacterium avium-intracellulare) ([place]) 08/29/2015  . Anemia of chronic disease   . Elevated liver enzymes   . Absolute anemia   . Pulmonary fibrosis ([place]) 05/10/2015  . Chronic respiratory failure with hypoxia ([place]) 05/10/2015  . Pulmonary nodule   . Pain, generalized 05/01/2015  . Laceration 05/01/2015  . PAF (paroxysmal atrial fibrillation) ([place])   . Protein-calorie malnutrition, severe ([place]) 10/21/2013  . Hemoptysis 10/20/2013  . Atrial fibrillation with rapid ventricular response ([place]) 07/10/2013  . Rotator cuff syndrome of left shoulder 01/20/2012  . Spondylosis, cervical 11/19/2011  . 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 Cavitary lesion of lung 05/07/2011  . GERD with stricture   . ASCVD (arteriosclerotic cardiovascular disease)   . Type 2 diabetes mellitus without complication ([place])   . TOBACCO ABUSE 05/17/2010  . Asthma 01/20/2008  . Hyperlipidemia 12/27/2007  . 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 COLONOSCOPY  2012   Dr. Posey Pronto: normal  . ESOPHAGOGASTRODUODENOSCOPY N/A 05/04/2015   Dr. Michail Sermon: minimal erosive esophagitis, small hiatal hernia. FOOD PRECLUDED A COMPLETE EXAM. No biopsies taken  . ESOPHAGOGASTRODUODENOSCOPY  2011   [place]:  Barrett's   . ESOPHAGOGASTRODUODENOSCOPY (EGD) WITH PROPOFOL N/A 05/06/2018   Dr. Gala Romney: erosive reflux esophagitis and +candida, Schatzki's ring s/p dilation, hiatal hernia.  [name] LUNG BIOPSY    . MALONEY DILATION N/A 05/06/2018   Procedure: Venia Minks DILATION;  Surgeon: Daneil Dolin, MD;  Location: [facility] ENDO SUITE;  Service: Endoscopy;  Laterality: N/A;  . Throat biopsy    . VASECTOMY    . VASECTOMY  1987        Home Medications    Prior to Admission medications    Medication Sig Start Date End Date Taking? Authorizing Provider  albuterol (VENTOLIN HFA) 108 (90 BASE) MCG/ACT inhaler Inhale 2 puffs into the lungs every 6 (six) hours as needed for wheezing or shortness of breath.   Yes [provider]  azithromycin (ZITHROMAX) 500 MG tablet Take 500 mg by mouth daily.   Yes [provider]  baclofen (LIORESAL) 20 MG tablet Take 20 mg by mouth 3 (three) times daily.  06/05/15  Yes [provider]  benzonatate (TESSALON) 200 MG capsule Take 200 mg by mouth 3 (three) times daily.   Yes [provider]  budesonide (PULMICORT) 0.25 MG/2ML nebulizer solution Take 2 mLs (0.25 mg total) by nebulization 2 (two) times daily. 10/24/17  Yes [name], MD  CARTIA XT 120 MG 24 hr capsule Take 1 capsule (120 mg total) by mouth daily. 01/01/18  Yes BranchAlphonse Guild, MD  diphenhydrAMINE (BENADRYL) 25 mg capsule Take 25 mg by mouth every 8 (eight) hours as needed. Felt mouth swelling at ED visit 08/16/18.   Yes [provider]  fexofenadine (ALLEGRA) 180 MG tablet Take 180 mg by mouth daily.   Yes [provider]  fluticasone (FLONASE) 50 MCG/ACT nasal spray Place 2 sprays into both nostrils daily as needed.  02/24/18  Yes [provider]  Fluticasone-Umeclidin-Vilant (TRELEGY ELLIPTA) 100-62.5-25 MCG/INH AEPB Inhale 1 puff into the lungs daily.   Yes [provider]  HYDROcodone-homatropine (HYCODAN) 5-1.5 MG/5ML syrup Take 5 mLs by mouth every 6 (six) hours as needed for cough.   Yes [provider]  insulin glargine (LANTUS) 100 UNIT/ML injection Inject 0.24 mLs (24 Units total) into the skin daily. 24 units daily Patient taking differently: Inject 32 Units into the skin daily.  10/24/17  Yes [name], MD  insulin lispro (HUMALOG) 100 UNIT/ML injection Take by sliding scale provided by hospital maximum amount daily is 60 units.  Check blood sugar 4 times a day Patient taking differently:  Inject 0-15 Units into the skin 3 (three) times daily with meals. Take by sliding scale provided by hospital maximum amount daily is 60 units.  Check blood sugar 4 times a day 09/23/17  Yes [name], MD  ipratropium (ATROVENT) 0.02 % nebulizer solution Take 0.5 mg by nebulization 2 (two) times daily.  09/29/17  Yes [provider]  levalbuterol Penne Lash) 0.63 MG/3ML nebulizer solution Take 0.63 mg by nebulization 2 (two) times daily.   Yes [provider]  levofloxacin (LEVAQUIN) 500 MG tablet Take 500 mg by mouth daily.   Yes [provider]  LORazepam (ATIVAN) 0.5 MG tablet Take 1 tablet (0.5 mg total) by mouth 2 (two) times daily as needed for anxiety or sleep. Patient taking differently: Take 0.5 mg by mouth 4 (four) times daily. Pt takes it three times daily. 05/04/15  Yes Mikhail, Velta Addison, DO  metFORMIN (GLUCOPHAGE) 500 MG tablet Take 1 tablet (500 mg total) by mouth daily with breakfast. 09/15/14  Yes Rexene Alberts, MD  nitroGLYCERIN (NITROSTAT) 0.4 MG SL tablet Place 1 tablet (0.4 mg total) under the tongue every 5 (five) minutes as needed for chest pain. 04/22/13  Yes Lendon Colonel, NP  omeprazole (PRILOSEC) 20 MG capsule Take 1 capsule (20 mg total) by mouth 2 (two) times daily before a meal. 08/04/18  Yes Annitta Needs, NP  oxyCODONE-acetaminophen (PERCOCET) 10-325 MG tablet Take 1 tablet by mouth every 4 (four) hours as needed for pain.   Yes [provider]  predniSONE (DELTASONE) 10 MG tablet Take 2 daily for 1 week then 1 daily Patient taking differently: Take 20 mg by mouth daily with breakfast. Takes 2 tablets daily. 10/24/17  Yes [name], MD  rifampin (RIFADIN) 300 MG capsule Take 2 capsules (600 mg total) by mouth daily. 11/17/17  Yes Comer, Okey Regal, MD  rosuvastatin (CRESTOR) 10 MG tablet Take 10 mg by mouth every morning.  04/10/18  Yes [provider]  predniSONE (DELTASONE) 10 MG tablet Take 2 tablets (20 mg total) by  mouth daily. 08/16/18   Varney Biles, MD    Family History Family History  Problem Relation Age of Onset  . Hypertension Mother   . Diabetes Mother   . Heart attack Mother   . Heart failure Mother   . Hypertension Sister   . Diabetes Sister   . Heart failure Sister   . Colon cancer Neg Hx     Social History Social History   Tobacco Use  . Smoking status: Former Smoker    Packs/day: 0.50    Years: 45.00    Pack years: 22.50    Types: Cigarettes    Last attempt to quit: 05/01/2015    Years since quitting: 3.3  . Smokeless tobacco: Never Used  Substance Use Topics  . Alcohol use: No    Alcohol/week: 0.0 standard drinks  . Drug use: No     Allergies   Albuterol; Ciprofloxacin; and Influenza vaccine live   Review of Systems Review of Systems  Constitutional: Negative for chills and fever.  HENT: Negative for sore throat and trouble swallowing.   Respiratory: Positive for cough and shortness of breath.   Cardiovascular: Negative for chest pain.  Gastrointestinal: Positive for abdominal distention and abdominal pain. Negative for constipation, diarrhea, nausea and vomiting.  Musculoskeletal: Negative for back pain, myalgias, neck pain and neck stiffness.  Skin: Negative for rash and wound.  Neurological: Negative for dizziness, weakness, light-headedness, numbness and headaches.  All other systems reviewed and are negative.    Physical Exam Updated Vital Signs BP (!) 131/94   Pulse (!) 117   Temp 100.2 F (37.9 C)   Resp (!) 27   Ht 5\' 8"  (1.727 m)   Wt 78.9 kg   SpO2 98%   BMI 26.46 kg/m   Physical Exam Vitals signs and nursing note reviewed.  Constitutional:      Appearance: He is well-developed.  HENT:     Head: Normocephalic and atraumatic.     Nose: Nose normal.     Mouth/Throat:     Mouth: Mucous membranes are moist.  Eyes:     Extraocular Movements: Extraocular movements intact.     Pupils: Pupils are equal, round, and reactive to light.   Neck:     Musculoskeletal: Normal range of motion and neck supple. No neck rigidity or muscular tenderness.  Cardiovascular:     Rate and Rhythm: Regular rhythm. Tachycardia present.     Heart sounds: No  murmur. No friction rub. No gallop.   Pulmonary:     Effort: Pulmonary effort is normal.     Breath sounds: Wheezing present.     Comments: Increased work of breathing.  Scattered expiratory wheezes. Abdominal:     General: Bowel sounds are normal. There is distension.     Palpations: Abdomen is soft.     Tenderness: There is abdominal tenderness. There is no guarding or rebound.     Comments: Abdomen is diffusely distended.  Mild diffuse abdominal tenderness to palpation without definite rebound or guarding.  Musculoskeletal: Normal range of motion.        General: No tenderness.     Right lower leg: Edema present.     Left lower leg: Edema present.     Comments: 2+ bilateral lower extremity pitting edema.  Lymphadenopathy:     Cervical: No cervical adenopathy.  Skin:    General: Skin is warm and dry.     Findings: No erythema or rash.  Neurological:     General: No focal deficit present.     Mental Status: He is alert and oriented to person, place, and time.  Psychiatric:        Mood and Affect: Mood normal.        Behavior: Behavior normal.      ED Treatments / Results  Labs (all labs ordered are listed, but only abnormal results are displayed) Labs Reviewed  CBC WITH DIFFERENTIAL/PLATELET - Abnormal; Notable for the following components:      Result Value   Hemoglobin 11.4 (*)    HCT 36.1 (*)    All other components within normal limits  COMPREHENSIVE METABOLIC PANEL - Abnormal; Notable for the following components:   Sodium 132 (*)    Chloride 96 (*)    Glucose, Bld 221 (*)    Calcium 8.2 (*)    Albumin 3.2 (*)    All other components within normal limits  CULTURE, BLOOD (ROUTINE X 2)  CULTURE, BLOOD (ROUTINE X 2)  BRAIN NATRIURETIC PEPTIDE  TROPONIN I   LACTIC ACID, PLASMA    EKG EKG Interpretation  Date/Time:  Thursday August 26 2018 19:16:50 EST Ventricular Rate:  128 PR Interval:    QRS Duration: 81 QT Interval:  312 QTC Calculation: 456 R Axis:   60 Text Interpretation:  Sinus tachycardia Ventricular premature complex Aberrant conduction of SV complex(es) Confirmed by Julianne Rice [id number]) on 08/26/2018 7:30:13 PM   Radiology Dg Chest Port 1 View  Result Date: 08/26/2018 CLINICAL DATA:  Shortness of breath EXAM: PORTABLE CHEST 1 VIEW COMPARISON:  08/16/2018 FINDINGS: Cardiac shadows within normal limits. The lungs are well aerated bilaterally. Emphysematous changes are again seen. Increasing left basilar infiltrate is noted. Superimposed over chronic changes in the bases. No acute bony abnormality is noted. IMPRESSION: Acute left basilar infiltrate superimposed over chronic changes in the bases Electronically Signed   By: Inez Catalina M.D.   On: 08/26/2018 19:50    Procedures Procedures (including critical care time)  Medications Ordered in ED Medications  levalbuterol (XOPENEX) nebulizer solution 1.25 mg ( Nebulization Canceled Entry 08/26/18 1942)  vancomycin (VANCOCIN) IVPB 1000 mg/200 mL premix (has no administration in time range)  piperacillin-tazobactam (ZOSYN) IVPB 3.375 g (has no administration in time range)  sodium chloride 0.9 % bolus 1,000 mL (has no administration in time range)  ipratropium (ATROVENT) nebulizer solution 0.5 mg (0.5 mg Nebulization Given 08/26/18 1943)  levalbuterol (XOPENEX) 1.25 MG/0.5ML nebulizer solution (1.25 mg  Given 08/26/18  1943)   CRITICAL CARE Performed by: Julianne Rice Total critical care time: 25 minutes Critical care time was exclusive of separately billable procedures and treating other patients. Critical care was necessary to treat or prevent imminent or life-threatening deterioration. Critical care was time spent personally by me on the following activities: development  of treatment plan with patient and/or surrogate as well as nursing, discussions with consultants, evaluation of patient's response to treatment, examination of patient, obtaining history from patient or surrogate, ordering and performing treatments and interventions, ordering and review of laboratory studies, ordering and review of radiographic studies, pulse oximetry and re-evaluation of patient's condition.  Initial Impression / Assessment and Plan / ED Course  I have reviewed the triage vital signs and the nursing notes.  Pertinent labs & imaging results that were available during my care of the patient were reviewed by me and considered in my medical decision making (see chart for details).    Patient tolerating BiPAP well.  New left basilar infiltrate.  Low-grade fever.  Normal white blood cell count and lactic acid.  Recent admission in the last 2 months.  Will start on broad-spectrum antibiotics.  Blood cultures drawn.  Discussed with hospitalist will see patient emergency department and admit.  Final Clinical Impressions(s) / ED Diagnoses   Final diagnoses:  HCAP (healthcare-associated pneumonia)  COPD exacerbation [facility])    ED Discharge Orders    None       Julianne Rice, MD 08/26/18 2034

## 2018-08-26 NOTE — ED Notes (Addendum)
Pt reports metal taste in mouth, and pt had a busted blood vessel in left eye

## 2018-08-26 NOTE — ED Triage Notes (Addendum)
Pt arrives via [facility] from home with resp distress.   Pt given xopenex and atrovent neb, 125 solumedrol, and also placed on cpap.

## 2018-08-26 NOTE — ED Notes (Addendum)
Lab in room to collect blood cultures

## 2018-08-26 NOTE — ED Notes (Signed)
Attempted nasal canula trial per hospitalist. Pt placed on 6l high flow nasal canula with respiratory. Pt unable to tolerate, desat to 84% . Placed back on bi-pap. hospitalist notified of the same

## 2018-08-26 NOTE — Progress Notes (Signed)
Pharmacy Antibiotic Note  Courtland Reas Alling is a 58 y.o. male admitted on 08/26/2018 with   pneumonia.  Pharmacy has been consulted for vancomycin and cefepime  dosing.  Plan: Start cefepime 2g IV q12h Loading dose:  Vancomycin 1g IV x1 dose Maintenance dose:  Vancomycin 1g IV q12h Goal vancomycin trough range: 15-20   mcg/mL Pharmacy will continue to monitor renal function, vancomycin troughs as clinically appropriate, cultures and patient progress.  Height: 5\' 8"  (172.7 cm) Weight: 174 lb (78.9 kg) IBW/kg (Calculated) : 68.4  Temp (24hrs), Avg:100.2 F (37.9 C), Min:100.2 F (37.9 C), Max:100.2 F (37.9 C)  Recent Labs  Lab 08/26/18 1921 08/26/18 1955  WBC 5.8  --   CREATININE 1.03  --   LATICACIDVEN  --  1.7    Estimated Creatinine Clearance: 74.7 mL/min (by C-G formula based on SCr of 1.03 mg/dL).    Allergies  Allergen Reactions  . Albuterol Palpitations  . 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 Influenza Vaccine Live Swelling    Antimicrobials this admission: Zosyn 2/13>> 2/13 vancomycin 2/13 >>   cefepime 2/13>>   Microbiology results:  2/13 Sputum:   2/13 MRSA PCR:  ordererd 2/13 Influenza PCR: 2/13 Strep Pneumo UAg: 2/13 Legionella:   Thank you for allowing pharmacy to be a part of this patient's care.  Despina Pole 08/26/2018 9:09 PM

## 2018-08-26 NOTE — H&P (Signed)
History and Physical    Ryan [name] IRW:[id number] DOB: Sep 12, 1960 DOA: 08/26/2018  PCP: [name], MD   Patient coming from: Home   Chief Complaint: Increased cough, wheezing, and SOB   HPI: Ryan [name] is a 58 y.o. male with medical history significant for severe COPD with chronic hypoxic respiratory failure, history of paroxysmal atrial fibrillation not on anticoagulation, insulin-dependent diabetes mellitus, and chronic pain, now presenting to the emergency department for evaluation of increase in his chronic shortness of breath and wheezing, increased productive cough.  Patient reports that his son and wife had been ill recently with respiratory symptoms, patient himself then developed increase in his chronic cough, shortness of breath, and wheezing several days ago.  He has progressively worsened.  He denies any chest pain.  He reports some mild chronic bilateral lower extremity swelling that is unchanged.  He has had a sore throat, but not much rhinorrhea.  He is not sure of his family members were tested for influenza or not.  He called EMS, was found to be saturating in the 80s on his usual 6 L/min supplemental oxygen, treated with Xopenex and 125 mg of IV Solu-Medrol, placed on CPAP, and transported to the ED.  ED Course: Upon arrival to the ED, patient is found to have a temp of 37.9 C, tachypnea, tachycardia in the 120s, stable blood pressure.  He was placed on BiPAP on arrival to the ED.  EKG features sinus tachycardia with rate 128 and PVC.  Chest x-ray is notable for acute left basilar infiltrate superimposed on chronic changes.  Chemistry panel features a glucose of 221 and CBC is notable for mild normocytic anemia.  Troponin is undetectable and BNP is normal.  Blood cultures were collected, 1 L normal saline administered, and the patient was treated with Xopenex, vancomycin, and Zosyn in the ED.  He reports some improvement with these measures and will be admitted for further  evaluation and management of pneumonia with exacerbation in COPD.  Review of Systems:  All other systems reviewed and apart from HPI, are negative.  Past Medical History:  Diagnosis Date  . Atrial fibrillation ([place])    Not anticoagulated  . Barrett's esophagus   . Borderline diabetes   . Bronchitis   . CAP (community acquired pneumonia) 07/10/2013   04/2015  . 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 Chronic back pain   . Chronic left shoulder pain   . Chronic neck pain   . Chronic respiratory failure ([place])   . Collagen vascular disease ([place])   . COPD (chronic obstructive pulmonary disease) ([place])   . Coronary atherosclerosis of native coronary artery    Mild nonobstructive disease at catheterization 2007  . DDD (degenerative disc disease)    Cervical and thoracic  . GERD (gastroesophageal reflux disease)   . History of pneumonia   . Hypercholesteremia   . Polycythemia   . Seizures ([place])    Last seizure 2 yrs ago  . Smoker   . Type 2 diabetes mellitus ([place])   . Type 2 diabetes mellitus without complication [facility])     Past Surgical History:  Procedure Laterality Date  . COLONOSCOPY  2012   Dr. Posey Pronto: normal  . ESOPHAGOGASTRODUODENOSCOPY N/A 05/04/2015   Dr. Michail Sermon: minimal erosive esophagitis, small hiatal hernia. FOOD PRECLUDED A COMPLETE EXAM. No biopsies taken  . ESOPHAGOGASTRODUODENOSCOPY  2011   [place]: Barrett's   . ESOPHAGOGASTRODUODENOSCOPY (EGD) WITH PROPOFOL N/A 05/06/2018  Dr. Gala Romney: erosive reflux esophagitis and +candida, Schatzki's ring s/p dilation, hiatal hernia.  [name] LUNG BIOPSY    . MALONEY DILATION N/A 05/06/2018   Procedure: Venia Minks DILATION;  Surgeon: Daneil Dolin, MD;  Location: [facility];  Service: Endoscopy;  Laterality: N/A;  . Throat biopsy    . VASECTOMY    . VASECTOMY  1987     reports that he quit smoking about 3 years ago. His smoking use included cigarettes. He has a 22.50 pack-year smoking  history. He has never used smokeless tobacco. He reports that he does not drink alcohol or use drugs.  Allergies  Allergen Reactions  . Albuterol Palpitations  . 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 Influenza Vaccine Live Swelling    Family History  Problem Relation Age of Onset  . Hypertension Mother   . Diabetes Mother   . Heart attack Mother   . Heart failure Mother   . Hypertension Sister   . Diabetes Sister   . Heart failure Sister   . Colon cancer Neg Hx      Prior to Admission medications   Medication Sig Start Date End Date Taking? Authorizing Provider  albuterol (VENTOLIN HFA) 108 (90 BASE) MCG/ACT inhaler Inhale 2 puffs into the lungs every 6 (six) hours as needed for wheezing or shortness of breath.   Yes [provider]  azithromycin (ZITHROMAX) 500 MG tablet Take 500 mg by mouth daily.   Yes [provider]  baclofen (LIORESAL) 20 MG tablet Take 20 mg by mouth 3 (three) times daily.  06/05/15  Yes [provider]  benzonatate (TESSALON) 200 MG capsule Take 200 mg by mouth 3 (three) times daily.   Yes [provider]  budesonide (PULMICORT) 0.25 MG/2ML nebulizer solution Take 2 mLs (0.25 mg total) by nebulization 2 (two) times daily. 10/24/17  Yes [name], MD  CARTIA XT 120 MG 24 hr capsule Take 1 capsule (120 mg total) by mouth daily. 01/01/18  Yes BranchAlphonse Guild, MD  diphenhydrAMINE (BENADRYL) 25 mg capsule Take 25 mg by mouth every 8 (eight) hours as needed. Felt mouth swelling at ED visit 08/16/18.   Yes [provider]  fexofenadine (ALLEGRA) 180 MG tablet Take 180 mg by mouth daily.   Yes [provider]  fluticasone (FLONASE) 50 MCG/ACT nasal spray Place 2 sprays into both nostrils daily as needed.  02/24/18  Yes [provider]  Fluticasone-Umeclidin-Vilant (TRELEGY ELLIPTA) 100-62.5-25 MCG/INH AEPB Inhale 1 puff into the  lungs daily.   Yes [provider]  HYDROcodone-homatropine (HYCODAN) 5-1.5 MG/5ML syrup Take 5 mLs by mouth every 6 (six) hours as needed for cough.   Yes [provider]  insulin glargine (LANTUS) 100 UNIT/ML injection Inject 0.24 mLs (24 Units total) into the skin daily. 24 units daily Patient taking differently: Inject 32 Units into the skin daily.  10/24/17  Yes [name], MD  insulin lispro (HUMALOG) 100 UNIT/ML injection Take by sliding scale provided by hospital maximum amount daily is 60 units.  Check blood sugar 4 times a day Patient taking differently: Inject 0-15 Units into the skin 3 (three) times daily with meals. Take by sliding scale provided by hospital maximum amount daily is 60 units.  Check blood sugar 4 times a day 09/23/17  Yes [name], MD  ipratropium (ATROVENT) 0.02 % nebulizer solution Take 0.5 mg by nebulization 2 (two) times daily.  09/29/17  Yes [provider]  levalbuterol (XOPENEX) 0.63 MG/3ML nebulizer solution Take 0.63 mg by nebulization 2 (two) times daily.   Yes [provider]  levofloxacin (LEVAQUIN) 500 MG tablet Take 500 mg by mouth daily.   Yes [provider]  LORazepam (ATIVAN) 0.5 MG tablet Take 1 tablet (0.5 mg total) by mouth 2 (two) times daily as needed for anxiety or sleep. Patient taking differently: Take 0.5 mg by mouth 4 (four) times daily. Pt takes it three times daily. 05/04/15  Yes Mikhail, Dent, DO  metFORMIN (GLUCOPHAGE) 500 MG tablet Take 1 tablet (500 mg total) by mouth daily with breakfast. 09/15/14  Yes Rexene Alberts, MD  nitroGLYCERIN (NITROSTAT) 0.4 MG SL tablet Place 1 tablet (0.4 mg total) under the tongue every 5 (five) minutes as needed for chest pain. 04/22/13  Yes Lendon Colonel, NP  omeprazole (PRILOSEC) 20 MG capsule Take 1 capsule (20 mg total) by mouth 2 (two) times daily before a meal. 08/04/18  Yes Annitta Needs, NP  oxyCODONE-acetaminophen (PERCOCET) 10-325 MG tablet  Take 1 tablet by mouth every 4 (four) hours as needed for pain.   Yes [provider]  predniSONE (DELTASONE) 10 MG tablet Take 2 daily for 1 week then 1 daily Patient taking differently: Take 20 mg by mouth daily with breakfast. Takes 2 tablets daily. 10/24/17  Yes [name], MD  rifampin (RIFADIN) 300 MG capsule Take 2 capsules (600 mg total) by mouth daily. 11/17/17  Yes Comer, Okey Regal, MD  rosuvastatin (CRESTOR) 10 MG tablet Take 10 mg by mouth every morning.  04/10/18  Yes [provider]  predniSONE (DELTASONE) 10 MG tablet Take 2 tablets (20 mg total) by mouth daily. 08/16/18   Varney Biles, MD    Physical Exam: Vitals:   08/26/18 1930 08/26/18 1943 08/26/18 1944 08/26/18 2002  BP: (!) 131/94  (!) 131/94   Pulse: (!) 112  (!) 117   Resp: 18  (!) 27   Temp:    100.2 F (37.9 C)  SpO2: 96% 98% 98%   Weight:      Height:        Constitutional: Tachypneic, increased WOB, no pallor, no diaphoresis  Eyes: PERTLA, lids and conjunctivae normal ENMT: Mucous membranes are moist. Posterior pharynx clear of any exudate or lesions.   Neck: normal, supple, no masses, no thyromegaly Respiratory: Diminished breath sounds. Prolonged expiratory phase with rhonchi and occasional wheeze. Labored respirations. No pallor, no cyanosis.  Cardiovascular: Rate ~120 and regular. Trace pretibial edema.   Abdomen: No distension, no tenderness, soft. Bowel sounds normal.  Musculoskeletal: no clubbing / cyanosis. No joint deformity upper and lower extremities.   Skin: no significant rashes, lesions, ulcers. Warm, dry, well-perfused. Neurologic: No facial asymmetry. Sensation intact. Moving all extremities.  Psychiatric: Alert and oriented x 3. Calm, cooperative.    Labs on Admission: I have personally reviewed following labs and imaging studies  CBC: Recent Labs  Lab 08/26/18 1921  WBC 5.8  NEUTROABS 4.1  HGB 11.4*  HCT 36.1*  MCV 85.5  PLT 081   Basic Metabolic  Panel: Recent Labs  Lab 08/26/18 1921  NA 132*  K 3.9  CL 96*  CO2 23  GLUCOSE 221*  BUN 16  CREATININE 1.03  CALCIUM 8.2*   GFR: Estimated Creatinine Clearance: 74.7 mL/min (by C-G formula based on SCr of 1.03 mg/dL). Liver Function Tests: Recent Labs  Lab 08/26/18 1921  AST 34  ALT 31  ALKPHOS 49  BILITOT  0.5  PROT 7.0  ALBUMIN 3.2*   No results for input(s): LIPASE, AMYLASE in the last 168 hours. No results for input(s): AMMONIA in the last 168 hours. Coagulation Profile: No results for input(s): INR, PROTIME in the last 168 hours. Cardiac Enzymes: Recent Labs  Lab 08/26/18 1921  TROPONINI <0.03   BNP (last 3 results) No results for input(s): PROBNP in the last 8760 hours. HbA1C: No results for input(s): HGBA1C in the last 72 hours. CBG: No results for input(s): GLUCAP in the last 168 hours. Lipid Profile: No results for input(s): CHOL, HDL, LDLCALC, TRIG, CHOLHDL, LDLDIRECT in the last 72 hours. Thyroid Function Tests: No results for input(s): TSH, T4TOTAL, FREET4, T3FREE, THYROIDAB in the last 72 hours. Anemia Panel: No results for input(s): VITAMINB12, FOLATE, FERRITIN, TIBC, IRON, RETICCTPCT in the last 72 hours. Urine analysis:    Component Value Date/Time   COLORURINE YELLOW 08/16/2018 0207   APPEARANCEUR CLEAR 08/16/2018 0207   LABSPEC 1.037 (H) 08/16/2018 0207   PHURINE 5.0 08/16/2018 0207   GLUCOSEU >=500 (A) 08/16/2018 0207   HGBUR SMALL (A) 08/16/2018 0207   BILIRUBINUR NEGATIVE 08/16/2018 0207   KETONESUR NEGATIVE 08/16/2018 0207   PROTEINUR NEGATIVE 08/16/2018 0207   UROBILINOGEN 0.2 09/08/2014 1444   NITRITE NEGATIVE 08/16/2018 0207   LEUKOCYTESUR NEGATIVE 08/16/2018 0207   Sepsis Labs: @LABRCNTIP (procalcitonin:4,lacticidven:4) )No results found for this or any previous visit (from the past 240 hour(s)).   Radiological Exams on Admission: Dg Chest Port 1 View  Result Date: 08/26/2018 CLINICAL DATA:  Shortness of breath EXAM:  PORTABLE CHEST 1 VIEW COMPARISON:  08/16/2018 FINDINGS: Cardiac shadows within normal limits. The lungs are well aerated bilaterally. Emphysematous changes are again seen. Increasing left basilar infiltrate is noted. Superimposed over chronic changes in the bases. No acute bony abnormality is noted. IMPRESSION: Acute left basilar infiltrate superimposed over chronic changes in the bases Electronically Signed   By: Inez Catalina M.D.   On: 08/26/2018 19:50    EKG: Independently reviewed. Sinus tachycardia (rate 128), PVC's.   Assessment/Plan   1. HCAP  - Presents with increased cough, SOB, and wheezing  - Found to have temp 110.2 F with tachycardia, hypoxia on his usual 6 Lpm, rhonchi and wheezes on exam, and new left basilar infiltrate on CXR  - Given his recent hospitalization and recent antibiotic use, he was started on vancomycin and Zosyn in ED  - Follow blood cultures, check sputum culture and strep pneumo antigen  - Continue empiric antibiotics with vancomycin and cefepime, follow cultures and clinical course    2. COPD exacerbation; acute on chronic hypoxic respiratory failure  - Secondary to infection  - Treated with 125 mg IV Solu-Medrol and Xopenex by EMS pta  - Check sputum culture, continue systemic steroids, abx as above, Trelegy, Atrovent, Xopenex, supplemental O2 for goal sat 88-92%    3. Insulin-dependent DM  - A1c was 8.7% last May  - Managed at home with Lantus 32 units qD and Humalog 0-15 units TID  - Check CBG's, continue Lantus and SSI   4. Chronic pain  - Continue home regimen with baclofen, gabapentin, and as-needed Percocet     DVT prophylaxis: Lovenox Code Status: Full  Family Communication: Discussed with patient  Consults called: None Admission status: Inpatient     Vianne Bulls, MD [facility] Pager [phone]  If 7PM-7AM, please contact night-coverage [email or web address] Password [facility]  08/26/2018, 8:43 PM

## 2018-08-27 ENCOUNTER — Encounter (HOSPITAL_COMMUNITY): Payer: Self-pay

## 2018-08-27 LAB — CBC WITH DIFFERENTIAL/PLATELET
Abs Immature Granulocytes: 0.02 10*3/uL (ref 0.00–0.07)
Basophils Absolute: 0 10*3/uL (ref 0.0–0.1)
Basophils Relative: 0 %
Eosinophils Absolute: 0 10*3/uL (ref 0.0–0.5)
Eosinophils Relative: 0 %
HCT: 32.6 % — ABNORMAL LOW (ref 39.0–52.0)
Hemoglobin: 10.2 g/dL — ABNORMAL LOW (ref 13.0–17.0)
Immature Granulocytes: 1 %
Lymphocytes Relative: 20 %
Lymphs Abs: 0.7 10*3/uL (ref 0.7–4.0)
MCH: 26.8 pg (ref 26.0–34.0)
MCHC: 31.3 g/dL (ref 30.0–36.0)
MCV: 85.6 fL (ref 80.0–100.0)
Monocytes Absolute: 0.2 10*3/uL (ref 0.1–1.0)
Monocytes Relative: 7 %
Neutro Abs: 2.3 10*3/uL (ref 1.7–7.7)
Neutrophils Relative %: 72 %
Platelets: 166 10*3/uL (ref 150–400)
RBC: 3.81 MIL/uL — AB (ref 4.22–5.81)
RDW: 13.9 % (ref 11.5–15.5)
WBC: 3.2 10*3/uL — AB (ref 4.0–10.5)
nRBC: 0 % (ref 0.0–0.2)

## 2018-08-27 LAB — BASIC METABOLIC PANEL
Anion gap: 9 (ref 5–15)
BUN: 15 mg/dL (ref 6–20)
CO2: 24 mmol/L (ref 22–32)
Calcium: 7.8 mg/dL — ABNORMAL LOW (ref 8.9–10.3)
Chloride: 102 mmol/L (ref 98–111)
Creatinine, Ser: 0.93 mg/dL (ref 0.61–1.24)
GFR calc Af Amer: >60 mL/min (ref >60)
GLUCOSE: 264 mg/dL — AB (ref 70–99)
Potassium: 4.4 mmol/L (ref 3.5–5.1)
Sodium: 135 mmol/L (ref 135–145)

## 2018-08-27 LAB — GLUCOSE, CAPILLARY
Glucose-Capillary: 282 mg/dL — ABNORMAL HIGH (ref 70–99)
Glucose-Capillary: 135 mg/dL — ABNORMAL HIGH (ref 70–99)

## 2018-08-27 LAB — CBG MONITORING, ED: Glucose-Capillary: 245 mg/dL — ABNORMAL HIGH (ref 70–99)

## 2018-08-27 LAB — STREP PNEUMONIAE URINARY ANTIGEN: Strep Pneumo Urinary Antigen: NEGATIVE

## 2018-08-27 LAB — INFLUENZA PANEL BY PCR (TYPE A & B)
Influenza A By PCR: POSITIVE — AB
Influenza B By PCR: NEGATIVE

## 2018-08-27 LAB — MRSA PCR SCREENING: MRSA BY PCR: NEGATIVE

## 2018-08-27 MED ORDER — HYDROMORPHONE HCL 1 MG/ML IJ SOLN
0.5000 mg | INTRAMUSCULAR | Status: DC | PRN
  Administered 2018-08-27 – 2018-08-29 (×7): 0.5 mg via INTRAVENOUS
  Filled 2018-08-27 (×2): qty 0.5
  Filled 2018-08-27: qty 1
  Filled 2018-08-27 (×4): qty 0.5

## 2018-08-27 MED ORDER — IPRATROPIUM BROMIDE 0.02 % IN SOLN
0.5000 mg | Freq: Four times a day (QID) | RESPIRATORY_TRACT | Status: DC
  Administered 2018-08-28 – 2018-09-02 (×23): 0.5 mg via RESPIRATORY_TRACT
  Filled 2018-08-27 (×23): qty 2.5

## 2018-08-27 MED ORDER — FLUTICASONE PROPIONATE 50 MCG/ACT NA SUSP
1.0000 | Freq: Every day | NASAL | Status: DC
  Administered 2018-08-27 – 2018-08-28 (×2): 1 via NASAL
  Filled 2018-08-27: qty 16

## 2018-08-27 MED ORDER — LEVALBUTEROL HCL 0.63 MG/3ML IN NEBU
0.6300 mg | INHALATION_SOLUTION | Freq: Four times a day (QID) | RESPIRATORY_TRACT | Status: DC
  Administered 2018-08-28 – 2018-09-02 (×23): 0.63 mg via RESPIRATORY_TRACT
  Filled 2018-08-27 (×24): qty 3

## 2018-08-27 MED ORDER — OSELTAMIVIR PHOSPHATE 75 MG PO CAPS
75.0000 mg | ORAL_CAPSULE | Freq: Two times a day (BID) | ORAL | Status: AC
  Administered 2018-08-27 – 2018-08-31 (×10): 75 mg via ORAL
  Filled 2018-08-27 (×10): qty 1

## 2018-08-27 NOTE — ED Notes (Signed)
Pt sitting on sob, family at bedside.  Request a break from bi pap. RT notified, explained to pt to wait for RT to come to room and assess

## 2018-08-27 NOTE — ED Notes (Signed)
RT at the bedside, gave pt full liquid tray. Sitting up to eat, will clean pt up after meal.

## 2018-08-27 NOTE — Progress Notes (Signed)
Subjective: He says he feels a little better.  He is on BiPAP.  He had trouble coming off of BiPAP last night but he wants to try again this morning.  He has pneumonia and influenza A and chronic Mycobacterium avium complex disease.  He also has severe COPD at baseline  Objective: Vital signs in last 24 hours: Temp:  [100.2 F (37.9 C)] 100.2 F (37.9 C) (02/13 2002) Pulse Rate:  [64-128] 80 (02/14 0830) Resp:  [18-27] 20 (02/14 0830) BP: (98-145)/(58-94) 114/68 (02/14 0830) SpO2:  [96 %-100 %] 99 % (02/14 0830) FiO2 (%):  [50 %] 50 % (02/14 0609) Weight:  [78.9 kg] 78.9 kg (02/13 1918) Weight change:     Intake/Output from previous day: 02/13 0701 - 02/14 0700 In: 1250 [IV Piggyback:1250] Out: -   PHYSICAL EXAM General appearance: alert, cooperative, mild distress and On BiPAP Resp: rhonchi bilaterally Cardio: regular rate and rhythm, S1, S2 normal, no murmur, click, rub or gallop GI: soft, non-tender; bowel sounds normal; no masses,  no organomegaly Extremities: extremities normal, atraumatic, no cyanosis or edema  Lab Results:  Results for orders placed or performed during the hospital encounter of 08/26/18 (from the past 48 hour(s))  CBC with Differential/Platelet     Status: Abnormal   Collection Time: 08/26/18  7:21 PM  Result Value Ref Range   WBC 5.8 4.0 - 10.5 K/uL   RBC 4.22 4.22 - 5.81 MIL/uL   Hemoglobin 11.4 (L) 13.0 - 17.0 g/dL   HCT 36.1 (L) 39.0 - 52.0 %   MCV 85.5 80.0 - 100.0 fL   MCH 27.0 26.0 - 34.0 pg   MCHC 31.6 30.0 - 36.0 g/dL   RDW 13.9 11.5 - 15.5 %   Platelets 166 150 - 400 K/uL   nRBC 0.0 0.0 - 0.2 %   Neutrophils Relative % 70 %   Neutro Abs 4.1 1.7 - 7.7 K/uL   Lymphocytes Relative 17 %   Lymphs Abs 1.0 0.7 - 4.0 K/uL   Monocytes Relative 11 %   Monocytes Absolute 0.7 0.1 - 1.0 K/uL   Eosinophils Relative 2 %   Eosinophils Absolute 0.1 0.0 - 0.5 K/uL   Basophils Relative 0 %   Basophils Absolute 0.0 0.0 - 0.1 K/uL   Immature  Granulocytes 0 %   Abs Immature Granulocytes 0.02 0.00 - 0.07 K/uL    Comment: Performed at [facility], [address], [place], [place] [address]  Brain natriuretic peptide     Status: None   Collection Time: 08/26/18  7:21 PM  Result Value Ref Range   B Natriuretic Peptide 18.0 0.0 - 100.0 pg/mL    Comment: Performed at [facility], [address], [place], [place] [address]  Comprehensive metabolic panel     Status: Abnormal   Collection Time: 08/26/18  7:21 PM  Result Value Ref Range   Sodium 132 (L) 135 - 145 mmol/L   Potassium 3.9 3.5 - 5.1 mmol/L   Chloride 96 (L) 98 - 111 mmol/L   CO2 23 22 - 32 mmol/L   Glucose, Bld 221 (H) 70 - 99 mg/dL   BUN 16 6 - 20 mg/dL   Creatinine, Ser 1.03 0.61 - 1.24 mg/dL   Calcium 8.2 (L) 8.9 - 10.3 mg/dL   Total Protein 7.0 6.5 - 8.1 g/dL   Albumin 3.2 (L) 3.5 - 5.0 g/dL   AST 34 15 - 41 U/L   ALT 31 0 - 44 U/L   Alkaline Phosphatase 49  38 - 126 U/L   Total Bilirubin 0.5 0.3 - 1.2 mg/dL   GFR calc non Af Amer >60 >60 mL/min   GFR calc Af Amer >60 >60 mL/min   Anion gap 13 5 - 15    Comment: Performed at [facility], [address], [place], [place] [address]  Troponin I - ONCE - STAT     Status: None   Collection Time: 08/26/18  7:21 PM  Result Value Ref Range   Troponin I <0.03 <0.03 ng/mL    Comment: Performed at [facility], [address] [place], [place] [address]  Lactic acid, plasma     Status: None   Collection Time: 08/26/18  7:55 PM  Result Value Ref Range   Lactic Acid, Venous 1.7 0.5 - 1.9 mmol/L    Comment: Performed at [facility], [address], [place], [place] [address]  Culture, blood (Routine X 2) w Reflex to ID Panel     Status: None (Preliminary result)   Collection Time: 08/26/18  8:34 PM  Result Value Ref Range   Specimen Description BLOOD RIGHT ANTECUBITAL    Special Requests      BOTTLES DRAWN AEROBIC AND ANAEROBIC Blood Culture adequate volume   Culture      NO GROWTH < 12 HOURS Performed at [facility], [address] [place], [place] [address]    Report Status PENDING   Culture, blood (Routine X 2) w Reflex to ID Panel     Status: None (Preliminary result)   Collection Time: 08/26/18  8:38 PM  Result Value Ref Range   Specimen Description BLOOD RIGHT HAND    Special Requests      BOTTLES DRAWN AEROBIC AND ANAEROBIC Blood Culture adequate volume   Culture      NO GROWTH < 12 HOURS Performed at [facility], [address], [place], [place] [address]    Report Status PENDING   CBG monitoring, ED     Status: Abnormal   Collection Time: 08/26/18  9:35 PM  Result Value Ref Range   Glucose-Capillary 231 (H) 70 - 99 mg/dL  Influenza panel by PCR (type A & B)     Status: Abnormal   Collection Time: 08/27/18  4:09 AM  Result Value Ref Range   Influenza A By PCR POSITIVE (A) NEGATIVE   Influenza B By PCR NEGATIVE NEGATIVE    Comment: (NOTE) The Xpert Xpress Flu assay is intended as an aid in the diagnosis of  influenza and should not be used as a sole basis for treatment.  This  assay is FDA approved for nasopharyngeal swab specimens only. Nasal  washings and aspirates are unacceptable for Xpert Xpress Flu testing. Performed at [facility], [address], [place], [place] [address]   CBC WITH DIFFERENTIAL     Status: Abnormal   Collection Time: 08/27/18  5:31 AM  Result Value Ref Range   WBC 3.2 (L) 4.0 - 10.5 K/uL   RBC 3.81 (L) 4.22 - 5.81 MIL/uL   Hemoglobin 10.2 (L) 13.0 - 17.0 g/dL   HCT 32.6 (L) 39.0 - 52.0 %   MCV 85.6 80.0 - 100.0 fL   MCH 26.8 26.0 - 34.0 pg   MCHC 31.3 30.0 - 36.0 g/dL   RDW 13.9 11.5 - 15.5 %   Platelets 166 150 - 400 K/uL   nRBC 0.0 0.0 - 0.2 %   Neutrophils Relative % 72 %   Neutro Abs 2.3 1.7 - 7.7 K/uL  Lymphocytes Relative 20 %   Lymphs Abs 0.7 0.7 - 4.0 K/uL   Monocytes Relative 7 %   Monocytes Absolute 0.2 0.1 - 1.0 K/uL   Eosinophils Relative 0 %   Eosinophils Absolute 0.0 0.0 - 0.5 K/uL   Basophils Relative 0 %   Basophils Absolute  0.0 0.0 - 0.1 K/uL   Immature Granulocytes 1 %   Abs Immature Granulocytes 0.02 0.00 - 0.07 K/uL    Comment: Performed at [facility], [address], [place], [place] [address]  Basic metabolic panel     Status: Abnormal   Collection Time: 08/27/18  5:31 AM  Result Value Ref Range   Sodium 135 135 - 145 mmol/L   Potassium 4.4 3.5 - 5.1 mmol/L   Chloride 102 98 - 111 mmol/L   CO2 24 22 - 32 mmol/L   Glucose, Bld 264 (H) 70 - 99 mg/dL   BUN 15 6 - 20 mg/dL   Creatinine, Ser 0.93 0.61 - 1.24 mg/dL   Calcium 7.8 (L) 8.9 - 10.3 mg/dL   GFR calc non Af Amer >60 >60 mL/min   GFR calc Af Amer >60 >60 mL/min   Anion gap 9 5 - 15    Comment: Performed at [facility], [address] [place], [place] [address]  CBG monitoring, ED     Status: Abnormal   Collection Time: 08/27/18  8:21 AM  Result Value Ref Range   Glucose-Capillary 245 (H) 70 - 99 mg/dL    ABGS No results for input(s): PHART, PO2ART, TCO2, HCO3 in the last 72 hours.  Invalid input(s): PCO2 CULTURES Recent Results (from the past 240 hour(s))  Culture, blood (Routine X 2) w Reflex to ID Panel     Status: None (Preliminary result)   Collection Time: 08/26/18  8:34 PM  Result Value Ref Range Status   Specimen Description BLOOD RIGHT ANTECUBITAL  Final   Special Requests   Final    BOTTLES DRAWN AEROBIC AND ANAEROBIC Blood Culture adequate volume   Culture   Final    NO GROWTH < 12 HOURS Performed at [facility], [address], [place], [place] [address]    Report Status PENDING  Incomplete  Culture, blood (Routine X 2) w Reflex to ID Panel     Status: None (Preliminary result)   Collection Time: 08/26/18  8:38 PM  Result Value Ref Range Status   Specimen Description BLOOD RIGHT HAND  Final   Special Requests   Final    BOTTLES DRAWN AEROBIC AND ANAEROBIC Blood Culture adequate volume   Culture   Final    NO GROWTH < 12 HOURS Performed at [facility], [address] [place],  [address]    Report Status  PENDING  Incomplete   Studies/Results: Dg Chest Port 1 View  Result Date: 08/26/2018 CLINICAL DATA:  Shortness of breath EXAM: PORTABLE CHEST 1 VIEW COMPARISON:  08/16/2018 FINDINGS: Cardiac shadows within normal limits. The lungs are well aerated bilaterally. Emphysematous changes are again seen. Increasing left basilar infiltrate is noted. Superimposed over chronic changes in the bases. No acute bony abnormality is noted. IMPRESSION: Acute left basilar infiltrate superimposed over chronic changes in the bases Electronically Signed   By: Inez Catalina M.D.   On: 08/26/2018 19:50    Medications:  Prior to Admission: (Not in a hospital admission)  Scheduled: . baclofen  20 mg Oral TID  . benzonatate  200 mg Oral TID  . diltiazem  120 mg Oral Daily  .  enoxaparin (LOVENOX) injection  40 mg Subcutaneous Q24H  . fluticasone furoate-vilanterol  1 puff Inhalation Daily   And  . umeclidinium bromide  1 puff Inhalation Daily  . insulin aspart  0-15 Units Subcutaneous TID WC  . insulin aspart  0-5 Units Subcutaneous QHS  . insulin glargine  25 Units Subcutaneous Daily  . ipratropium  0.5 mg Nebulization BID  . methylPREDNISolone (SOLU-MEDROL) injection  60 mg Intravenous Q6H  . pantoprazole  40 mg Oral Daily  . rosuvastatin  10 mg Oral q1800   Continuous: . sodium chloride 10 mL/hr at 08/26/18 2057  . ceFEPime (MAXIPIME) IV 2 g (08/27/18 0407)  . vancomycin 1,000 mg (08/27/18 0203)   JPE:TKKOEC chloride, HYDROmorphone, levalbuterol, LORazepam, oxyCODONE-acetaminophen **AND** oxyCODONE  Assesment: He was admitted with healthcare associated pneumonia and is being treated appropriately.  He has acute on chronic hypoxic respiratory failure and is requiring BiPAP now.  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  He is followed by infectious disease Principal Problem:   HCAP (healthcare-associated pneumonia) Active Problems:   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  quit smoking 05/01/15    Type 2 diabetes mellitus without complication (HCC)   COPD exacerbation (HCC)   Pulmonary fibrosis (HCC)   Acute on chronic respiratory failure with hypoxia ([place])    Plan: Continue current treatments.  Try off BiPAP.    LOS: 1 day   Ryan [name] 08/27/2018, 9:05 AM

## 2018-08-27 NOTE — Progress Notes (Signed)
Inpatient Diabetes Program Recommendations  AACE/ADA: New Consensus Statement on Inpatient Glycemic Control (2015)  Target Ranges:  Prepandial:   less than 140 mg/dL      Peak postprandial:   less than 180 mg/dL (1-2 hours)      Critically ill patients:  140 - 180 mg/dL   Lab Results  Component Value Date   GLUCAP 282 (H) 08/27/2018   HGBA1C 8.7 (H) 11/28/2017    Review of Glycemic Control  Diabetes history: Type 2 Outpatient Diabetes medications: Lantus 32 units daily, Humalog 0-15 units sliding scale 4 times/day Current orders for Inpatient glycemic control: Lantus 25 units daily, Novolog 0-15 units TID & HS  Inpatient Diabetes Program Recommendations:   Noted that blood sugars have been greater than 180 mg/dl.  Recommend increasing Lantus to 30 units daily (home dose is Lantus 32 units daily) and continue Novolog MODERATE correction scale TID & HS.   Harvel Ricks RN BSN CDE Diabetes Coordinator Pager: [phone]  8am-5pm

## 2018-08-28 LAB — CBC WITH DIFFERENTIAL/PLATELET
Abs Immature Granulocytes: 0.02 10*3/uL (ref 0.00–0.07)
BASOS PCT: 0 %
Basophils Absolute: 0 10*3/uL (ref 0.0–0.1)
EOS ABS: 0 10*3/uL (ref 0.0–0.5)
EOS PCT: 0 %
HCT: 39 % (ref 39.0–52.0)
Hemoglobin: 12.1 g/dL — ABNORMAL LOW (ref 13.0–17.0)
Immature Granulocytes: 1 %
Lymphocytes Relative: 17 %
Lymphs Abs: 0.7 10*3/uL (ref 0.7–4.0)
MCH: 26.8 pg (ref 26.0–34.0)
MCHC: 31 g/dL (ref 30.0–36.0)
MCV: 86.3 fL (ref 80.0–100.0)
Monocytes Absolute: 0.3 10*3/uL (ref 0.1–1.0)
Monocytes Relative: 7 %
Neutro Abs: 3.3 10*3/uL (ref 1.7–7.7)
Neutrophils Relative %: 75 %
PLATELETS: 198 10*3/uL (ref 150–400)
RBC: 4.52 MIL/uL (ref 4.22–5.81)
RDW: 13.8 % (ref 11.5–15.5)
WBC: 4.4 10*3/uL (ref 4.0–10.5)
nRBC: 0 % (ref 0.0–0.2)

## 2018-08-28 LAB — BASIC METABOLIC PANEL
Anion gap: 12 (ref 5–15)
BUN: 14 mg/dL (ref 6–20)
CO2: 26 mmol/L (ref 22–32)
CREATININE: 1.09 mg/dL (ref 0.61–1.24)
Calcium: 8.7 mg/dL — ABNORMAL LOW (ref 8.9–10.3)
Chloride: 99 mmol/L (ref 98–111)
GFR calc Af Amer: >60 mL/min (ref >60)
GFR calc non Af Amer: >60 mL/min (ref >60)
Glucose, Bld: 389 mg/dL — ABNORMAL HIGH (ref 70–99)
Potassium: 4.1 mmol/L (ref 3.5–5.1)
Sodium: 137 mmol/L (ref 135–145)

## 2018-08-28 LAB — GLUCOSE, CAPILLARY
GLUCOSE-CAPILLARY: 272 mg/dL — AB (ref 70–99)
GLUCOSE-CAPILLARY: 408 mg/dL — AB (ref 70–99)
Glucose-Capillary: 288 mg/dL — ABNORMAL HIGH (ref 70–99)
Glucose-Capillary: 284 mg/dL — ABNORMAL HIGH (ref 70–99)

## 2018-08-28 LAB — HIV ANTIBODY (ROUTINE TESTING W REFLEX): HIV SCREEN 4TH GENERATION: NONREACTIVE

## 2018-08-28 MED ORDER — INSULIN ASPART 100 UNIT/ML ~~LOC~~ SOLN
6.0000 [IU] | Freq: Once | SUBCUTANEOUS | Status: AC
  Administered 2018-08-28: 6 [IU] via SUBCUTANEOUS

## 2018-08-28 MED ORDER — GUAIFENESIN ER 600 MG PO TB12
1200.0000 mg | ORAL_TABLET | Freq: Two times a day (BID) | ORAL | Status: DC
  Administered 2018-08-28 – 2018-09-10 (×27): 1200 mg via ORAL
  Filled 2018-08-28 (×29): qty 2

## 2018-08-28 MED ORDER — ALUM & MAG HYDROXIDE-SIMETH 200-200-20 MG/5ML PO SUSP
30.0000 mL | ORAL | Status: DC | PRN
  Administered 2018-08-28: 30 mL via ORAL
  Filled 2018-08-28: qty 30

## 2018-08-28 MED ORDER — DIPHENHYDRAMINE HCL 25 MG PO CAPS
25.0000 mg | ORAL_CAPSULE | Freq: Four times a day (QID) | ORAL | Status: DC | PRN
  Administered 2018-08-28 – 2018-09-09 (×4): 25 mg via ORAL
  Filled 2018-08-28 (×4): qty 1

## 2018-08-28 NOTE — Progress Notes (Signed)
Patient has done very well he is on 10 lpm high flow nasal cannula though could probably wean on down. He does have breath sounds with wheezes most of the rhonchi is upper which is audible. Have not pushed to have Him wear BiPAP as he doesn't care for it. He has a heavy beard and doesn't like mask treatments any way BiPAP is available if needed

## 2018-08-29 LAB — CBC WITH DIFFERENTIAL/PLATELET
Abs Immature Granulocytes: 0.03 10*3/uL (ref 0.00–0.07)
BASOS ABS: 0 10*3/uL (ref 0.0–0.1)
Basophils Relative: 0 %
Eosinophils Absolute: 0 10*3/uL (ref 0.0–0.5)
Eosinophils Relative: 0 %
HCT: 34.1 % — ABNORMAL LOW (ref 39.0–52.0)
Hemoglobin: 10.8 g/dL — ABNORMAL LOW (ref 13.0–17.0)
Immature Granulocytes: 1 %
LYMPHS ABS: 0.7 10*3/uL (ref 0.7–4.0)
Lymphocytes Relative: 20 %
MCH: 27.1 pg (ref 26.0–34.0)
MCHC: 31.7 g/dL (ref 30.0–36.0)
MCV: 85.7 fL (ref 80.0–100.0)
Monocytes Absolute: 0.2 10*3/uL (ref 0.1–1.0)
Monocytes Relative: 6 %
Neutro Abs: 2.7 10*3/uL (ref 1.7–7.7)
Neutrophils Relative %: 73 %
Platelets: 203 10*3/uL (ref 150–400)
RBC: 3.98 MIL/uL — ABNORMAL LOW (ref 4.22–5.81)
RDW: 13.5 % (ref 11.5–15.5)
WBC: 3.7 10*3/uL — ABNORMAL LOW (ref 4.0–10.5)
nRBC: 0 % (ref 0.0–0.2)

## 2018-08-29 LAB — BASIC METABOLIC PANEL
ANION GAP: 10 (ref 5–15)
BUN: 15 mg/dL (ref 6–20)
CO2: 28 mmol/L (ref 22–32)
Calcium: 8.3 mg/dL — ABNORMAL LOW (ref 8.9–10.3)
Chloride: 95 mmol/L — ABNORMAL LOW (ref 98–111)
Creatinine, Ser: 0.92 mg/dL (ref 0.61–1.24)
GFR calc Af Amer: >60 mL/min (ref >60)
GFR calc non Af Amer: >60 mL/min (ref >60)
Glucose, Bld: 348 mg/dL — ABNORMAL HIGH (ref 70–99)
Potassium: 4.7 mmol/L (ref 3.5–5.1)
Sodium: 133 mmol/L — ABNORMAL LOW (ref 135–145)

## 2018-08-29 LAB — GLUCOSE, CAPILLARY
GLUCOSE-CAPILLARY: 350 mg/dL — AB (ref 70–99)
GLUCOSE-CAPILLARY: 334 mg/dL — AB (ref 70–99)
Glucose-Capillary: 248 mg/dL — ABNORMAL HIGH (ref 70–99)
Glucose-Capillary: 284 mg/dL — ABNORMAL HIGH (ref 70–99)
Glucose-Capillary: 324 mg/dL — ABNORMAL HIGH (ref 70–99)

## 2018-08-29 LAB — LEGIONELLA PNEUMOPHILA SEROGP 1 UR AG: L. PNEUMOPHILA SEROGP 1 UR AG: NEGATIVE

## 2018-08-29 MED ORDER — HYDROMORPHONE HCL 1 MG/ML IJ SOLN
1.0000 mg | INTRAMUSCULAR | Status: DC | PRN
  Filled 2018-08-29 (×2): qty 1

## 2018-08-29 MED ORDER — SALINE SPRAY 0.65 % NA SOLN
1.0000 | NASAL | Status: DC | PRN
  Administered 2018-09-04: 1 via NASAL
  Filled 2018-08-29: qty 44

## 2018-08-29 MED ORDER — FLUTICASONE PROPIONATE 50 MCG/ACT NA SUSP
2.0000 | Freq: Every day | NASAL | Status: DC
  Administered 2018-08-29 – 2018-09-10 (×12): 2 via NASAL
  Filled 2018-08-29: qty 16

## 2018-08-29 MED ORDER — OXYMETAZOLINE HCL 0.05 % NA SOLN
1.0000 | Freq: Two times a day (BID) | NASAL | Status: AC
  Administered 2018-08-29 – 2018-08-31 (×4): 1 via NASAL
  Filled 2018-08-29: qty 15

## 2018-08-29 NOTE — Progress Notes (Signed)
Subjective: He feels about the same.  He is still very weak.  He says going to the bathroom tires him out.  He is coughing nonproductively.  He is still requiring high flow nasal cannula.  He complains of extreme nasal congestion and says he is having trouble breathing through his nose  Objective: Vital signs in last 24 hours: Temp:  [97.5 F (36.4 C)-98.5 F (36.9 C)] 97.8 F (36.6 C) (02/16 0800) Pulse Rate:  [72-106] 84 (02/16 0800) Resp:  [15-25] 22 (02/16 0800) BP: (101-179)/(56-92) 112/75 (02/16 0610) SpO2:  [92 %-100 %] 95 % (02/16 0800) Weight:  [78.5 kg] 78.5 kg (02/16 0500) Weight change:  Last BM Date: 08/28/18  Intake/Output from previous day: 02/15 0701 - 02/16 0700 In: 1661.6 [P.O.:1080; IV Piggyback:581.6] Out: 3700 [Urine:3700]  PHYSICAL EXAM General appearance: alert, cooperative and mild distress Resp: rhonchi bilaterally Cardio: regular rate and rhythm, S1, S2 normal, no murmur, click, rub or gallop GI: soft, non-tender; bowel sounds normal; no masses,  no organomegaly Extremities: extremities normal, atraumatic, no cyanosis or edema  Lab Results:  Results for orders placed or performed during the hospital encounter of 08/26/18 (from the past 48 hour(s))  Glucose, capillary     Status: Abnormal   Collection Time: 08/27/18 11:04 AM  Result Value Ref Range   Glucose-Capillary 282 (H) 70 - 99 mg/dL  MRSA PCR Screening     Status: None   Collection Time: 08/27/18  4:00 PM  Result Value Ref Range   MRSA by PCR NEGATIVE NEGATIVE    Comment:        The GeneXpert MRSA Assay (FDA approved for NASAL specimens only), is one component of a comprehensive MRSA colonization surveillance program. It is not intended to diagnose MRSA infection nor to guide or monitor treatment for MRSA infections. Performed at [facility], [address] [place], [place] [address]   Glucose, capillary     Status: Abnormal   Collection Time: 08/27/18  9:10 PM  Result Value Ref  Range   Glucose-Capillary 135 (H) 70 - 99 mg/dL   Comment 1 Notify RN    Comment 2 Document in Chart   Glucose, capillary     Status: Abnormal   Collection Time: 08/28/18  7:41 AM  Result Value Ref Range   Glucose-Capillary 288 (H) 70 - 99 mg/dL  CBC with Differential/Platelet     Status: Abnormal   Collection Time: 08/28/18 10:09 AM  Result Value Ref Range   WBC 4.4 4.0 - 10.5 K/uL   RBC 4.52 4.22 - 5.81 MIL/uL   Hemoglobin 12.1 (L) 13.0 - 17.0 g/dL   HCT 39.0 39.0 - 52.0 %   MCV 86.3 80.0 - 100.0 fL   MCH 26.8 26.0 - 34.0 pg   MCHC 31.0 30.0 - 36.0 g/dL   RDW 13.8 11.5 - 15.5 %   Platelets 198 150 - 400 K/uL   nRBC 0.0 0.0 - 0.2 %   Neutrophils Relative % 75 %   Neutro Abs 3.3 1.7 - 7.7 K/uL   Lymphocytes Relative 17 %   Lymphs Abs 0.7 0.7 - 4.0 K/uL   Monocytes Relative 7 %   Monocytes Absolute 0.3 0.1 - 1.0 K/uL   Eosinophils Relative 0 %   Eosinophils Absolute 0.0 0.0 - 0.5 K/uL   Basophils Relative 0 %   Basophils Absolute 0.0 0.0 - 0.1 K/uL   Immature Granulocytes 1 %   Abs Immature Granulocytes 0.02 0.00 - 0.07 K/uL    Comment:  Performed at [facility], [address], [place], [place] [address]  Basic metabolic panel     Status: Abnormal   Collection Time: 08/28/18 10:09 AM  Result Value Ref Range   Sodium 137 135 - 145 mmol/L   Potassium 4.1 3.5 - 5.1 mmol/L   Chloride 99 98 - 111 mmol/L   CO2 26 22 - 32 mmol/L   Glucose, Bld 389 (H) 70 - 99 mg/dL   BUN 14 6 - 20 mg/dL   Creatinine, Ser 1.09 0.61 - 1.24 mg/dL   Calcium 8.7 (L) 8.9 - 10.3 mg/dL   GFR calc non Af Amer >60 >60 mL/min   GFR calc Af Amer >60 >60 mL/min   Anion gap 12 5 - 15    Comment: Performed at [facility], [address] [place], [place] [address]  Glucose, capillary     Status: Abnormal   Collection Time: 08/28/18 11:47 AM  Result Value Ref Range   Glucose-Capillary 284 (H) 70 - 99 mg/dL  Glucose, capillary     Status: Abnormal   Collection Time: 08/28/18  4:26 PM  Result Value Ref  Range   Glucose-Capillary 272 (H) 70 - 99 mg/dL  Glucose, capillary     Status: Abnormal   Collection Time: 08/28/18  8:47 PM  Result Value Ref Range   Glucose-Capillary 408 (H) 70 - 99 mg/dL   Comment 1 Notify RN    Comment 2 Document in Chart   Glucose, capillary     Status: Abnormal   Collection Time: 08/29/18 12:00 AM  Result Value Ref Range   Glucose-Capillary 284 (H) 70 - 99 mg/dL   Comment 1 Notify RN    Comment 2 Document in Chart   CBC with Differential/Platelet     Status: Abnormal   Collection Time: 08/29/18  4:57 AM  Result Value Ref Range   WBC 3.7 (L) 4.0 - 10.5 K/uL   RBC 3.98 (L) 4.22 - 5.81 MIL/uL   Hemoglobin 10.8 (L) 13.0 - 17.0 g/dL   HCT 34.1 (L) 39.0 - 52.0 %   MCV 85.7 80.0 - 100.0 fL   MCH 27.1 26.0 - 34.0 pg   MCHC 31.7 30.0 - 36.0 g/dL   RDW 13.5 11.5 - 15.5 %   Platelets 203 150 - 400 K/uL   nRBC 0.0 0.0 - 0.2 %   Neutrophils Relative % 73 %   Neutro Abs 2.7 1.7 - 7.7 K/uL   Lymphocytes Relative 20 %   Lymphs Abs 0.7 0.7 - 4.0 K/uL   Monocytes Relative 6 %   Monocytes Absolute 0.2 0.1 - 1.0 K/uL   Eosinophils Relative 0 %   Eosinophils Absolute 0.0 0.0 - 0.5 K/uL   Basophils Relative 0 %   Basophils Absolute 0.0 0.0 - 0.1 K/uL   Immature Granulocytes 1 %   Abs Immature Granulocytes 0.03 0.00 - 0.07 K/uL   Reactive, Benign Lymphocytes PRESENT     Comment: Performed at [facility], [address] [place], [place] [address]  Basic metabolic panel     Status: Abnormal   Collection Time: 08/29/18  4:57 AM  Result Value Ref Range   Sodium 133 (L) 135 - 145 mmol/L   Potassium 4.7 3.5 - 5.1 mmol/L   Chloride 95 (L) 98 - 111 mmol/L   CO2 28 22 - 32 mmol/L   Glucose, Bld 348 (H) 70 - 99 mg/dL   BUN 15 6 - 20 mg/dL   Creatinine, Ser 0.92 0.61 - 1.24  mg/dL   Calcium 8.3 (L) 8.9 - 10.3 mg/dL   GFR calc non Af Amer >60 >60 mL/min   GFR calc Af Amer >60 >60 mL/min   Anion gap 10 5 - 15    Comment: Performed at [facility], [address]  [place], [place] [address]  Glucose, capillary     Status: Abnormal   Collection Time: 08/29/18  7:59 AM  Result Value Ref Range   Glucose-Capillary 350 (H) 70 - 99 mg/dL    ABGS No results for input(s): PHART, PO2ART, TCO2, HCO3 in the last 72 hours.  Invalid input(s): PCO2 CULTURES Recent Results (from the past 240 hour(s))  Culture, blood (Routine X 2) w Reflex to ID Panel     Status: None (Preliminary result)   Collection Time: 08/26/18  8:34 PM  Result Value Ref Range Status   Specimen Description BLOOD RIGHT ANTECUBITAL  Final   Special Requests   Final    BOTTLES DRAWN AEROBIC AND ANAEROBIC Blood Culture adequate volume   Culture   Final    NO GROWTH 3 DAYS Performed at [facility], [address] [place], [place] [address]    Report Status PENDING  Incomplete  Culture, blood (Routine X 2) w Reflex to ID Panel     Status: None (Preliminary result)   Collection Time: 08/26/18  8:38 PM  Result Value Ref Range Status   Specimen Description BLOOD RIGHT HAND  Final   Special Requests   Final    BOTTLES DRAWN AEROBIC AND ANAEROBIC Blood Culture adequate volume   Culture   Final    NO GROWTH 3 DAYS Performed at [facility], [address], [place], [place] [address]    Report Status PENDING  Incomplete  MRSA PCR Screening     Status: None   Collection Time: 08/27/18  4:00 PM  Result Value Ref Range Status   MRSA by PCR NEGATIVE NEGATIVE Final    Comment:        The GeneXpert MRSA Assay (FDA approved for NASAL specimens only), is one component of a comprehensive MRSA colonization surveillance program. It is not intended to diagnose MRSA infection nor to guide or monitor treatment for MRSA infections. Performed at [facility], [address] [place], [place] [address]    Studies/Results: No results found.  Medications:  Prior to Admission:  Medications Prior to Admission  Medication Sig Dispense Refill Last Dose  . albuterol (VENTOLIN HFA) 108 (90 BASE) MCG/ACT  inhaler Inhale 2 puffs into the lungs every 6 (six) hours as needed for wheezing or shortness of breath.   08/26/2018  . azithromycin (ZITHROMAX) 500 MG tablet Take 500 mg by mouth daily.   08/26/2018  . baclofen (LIORESAL) 20 MG tablet Take 20 mg by mouth 3 (three) times daily.    08/26/2018  . benzonatate (TESSALON) 200 MG capsule Take 200 mg by mouth 3 (three) times daily.   08/26/2018  . budesonide (PULMICORT) 0.25 MG/2ML nebulizer solution Take 2 mLs (0.25 mg total) by nebulization 2 (two) times daily. 60 mL 12 08/26/2018  . CARTIA XT 120 MG 24 hr capsule Take 1 capsule (120 mg total) by mouth daily. 7 capsule 0 08/26/2018  . diphenhydrAMINE (BENADRYL) 25 mg capsule Take 25 mg by mouth every 8 (eight) hours as needed. Felt mouth swelling at ED visit 08/16/18.   Past Week at Unknown time  . fexofenadine (ALLEGRA) 180 MG tablet Take 180 mg by mouth daily.   Past Week at Unknown time  .  fluticasone (FLONASE) 50 MCG/ACT nasal spray Place 2 sprays into both nostrils daily as needed.   5 08/26/2018  . Fluticasone-Umeclidin-Vilant (TRELEGY ELLIPTA) 100-62.5-25 MCG/INH AEPB Inhale 1 puff into the lungs daily.   08/26/2018  . HYDROcodone-homatropine (HYCODAN) 5-1.5 MG/5ML syrup Take 5 mLs by mouth every 6 (six) hours as needed for cough.   08/26/2018  . insulin glargine (LANTUS) 100 UNIT/ML injection Inject 0.24 mLs (24 Units total) into the skin daily. 24 units daily (Patient taking differently: Inject 32 Units into the skin daily. ) 10 mL 11 08/26/2018  . insulin lispro (HUMALOG) 100 UNIT/ML injection Take by sliding scale provided by hospital maximum amount daily is 60 units.  Check blood sugar 4 times a day (Patient taking differently: Inject 0-15 Units into the skin 3 (three) times daily with meals. Take by sliding scale provided by hospital maximum amount daily is 60 units.  Check blood sugar 4 times a day) 10 mL 11 08/26/2018  . ipratropium (ATROVENT) 0.02 % nebulizer solution Take 0.5 mg by nebulization 2 (two)  times daily.   5 08/26/2018  . levalbuterol (XOPENEX) 0.63 MG/3ML nebulizer solution Take 0.63 mg by nebulization 2 (two) times daily.   08/26/2018  . levofloxacin (LEVAQUIN) 500 MG tablet Take 500 mg by mouth daily.   08/26/2018  . LORazepam (ATIVAN) 0.5 MG tablet Take 1 tablet (0.5 mg total) by mouth 2 (two) times daily as needed for anxiety or sleep. (Patient taking differently: Take 0.5 mg by mouth 4 (four) times daily. Pt takes it three times daily.) 20 tablet 0 08/26/2018  . metFORMIN (GLUCOPHAGE) 500 MG tablet Take 1 tablet (500 mg total) by mouth daily with breakfast. 30 tablet 3 08/26/2018  . nitroGLYCERIN (NITROSTAT) 0.4 MG SL tablet Place 1 tablet (0.4 mg total) under the tongue every 5 (five) minutes as needed for chest pain. 25 tablet 4 08/26/2018  . omeprazole (PRILOSEC) 20 MG capsule Take 1 capsule (20 mg total) by mouth 2 (two) times daily before a meal. 180 capsule 3 08/26/2018  . oxyCODONE-acetaminophen (PERCOCET) 10-325 MG tablet Take 1 tablet by mouth every 4 (four) hours as needed for pain.   08/26/2018  . predniSONE (DELTASONE) 10 MG tablet Take 2 daily for 1 week then 1 daily (Patient taking differently: Take 20 mg by mouth daily with breakfast. Takes 2 tablets daily.) 60 tablet 5 08/26/2018  . rifampin (RIFADIN) 300 MG capsule Take 2 capsules (600 mg total) by mouth daily. 60 capsule 11 08/26/2018  . rosuvastatin (CRESTOR) 10 MG tablet Take 10 mg by mouth every morning.   11 08/26/2018  . predniSONE (DELTASONE) 10 MG tablet Take 2 tablets (20 mg total) by mouth daily. 6 tablet 0 Taking   Scheduled: . baclofen  20 mg Oral TID  . benzonatate  200 mg Oral TID  . diltiazem  120 mg Oral Daily  . enoxaparin (LOVENOX) injection  40 mg Subcutaneous Q24H  . fluticasone  1 spray Each Nare Daily  . fluticasone  2 spray Each Nare Daily  . fluticasone furoate-vilanterol  1 puff Inhalation Daily   And  . umeclidinium bromide  1 puff Inhalation Daily  . guaiFENesin  1,200 mg Oral BID  . insulin  aspart  0-15 Units Subcutaneous TID WC  . insulin aspart  0-5 Units Subcutaneous QHS  . insulin glargine  25 Units Subcutaneous Daily  . ipratropium  0.5 mg Nebulization Q6H  . levalbuterol  0.63 mg Nebulization Q6H  . methylPREDNISolone (SOLU-MEDROL) injection  60 mg  Intravenous Q6H  . oseltamivir  75 mg Oral BID  . oxymetazoline  1 spray Each Nare BID  . pantoprazole  40 mg Oral Daily  . rosuvastatin  10 mg Oral q1800   Continuous: . sodium chloride 90 mL/hr at 08/26/18 2220  . ceFEPime (MAXIPIME) IV Stopped (08/29/18 0321)  . vancomycin Stopped (08/29/18 0109)   KDP:TELMRA chloride, alum & mag hydroxide-simeth, diphenhydrAMINE, HYDROmorphone, LORazepam, oxyCODONE-acetaminophen **AND** oxyCODONE, sodium chloride  Assesment: He was admitted with healthcare associated pneumonia associated with influenza A.  He is on treatment for influenza.  He is on broad-spectrum antibiotics for pneumonia.  He has COPD Gold 3.  He is more short of breath than usual.  He has acute on chronic hypoxic respiratory failure and he is still requiring high flow nasal oxygen  He is weaker than he typically is with hospitalization so I am going to get physical therapy to see him  He has nasal congestion which will be treated  He has chronic pain and says his pain is worse not surprising in the face of influenza  He has diabetes and is on sliding scale  He has pulmonary mycobacterial disease and he has been on treatment for that but developed worsening problems with vision on ethambutol and he is now off that Principal Problem:   HCAP (healthcare-associated pneumonia) Active Problems:   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  quit smoking 05/01/15    Type 2 diabetes mellitus without complication (HCC)   COPD exacerbation ([place])   Pulmonary fibrosis ([place])   Acute on chronic respiratory failure with hypoxia ([place])    Plan: Continue current treatments.  Add medication for nasal congestion.  PT consult.  I do not  think there is really anything else to do.    LOS: 3 days   Alonza Bogus 08/29/2018, 9:37 AM

## 2018-08-29 NOTE — Progress Notes (Signed)
Pharmacy Antibiotic Note  Ryan [name] is a 58 y.o. male admitted on 08/26/2018 with   pneumonia.  Pharmacy has been consulted for vancomycin and cefepime  dosing.  Plan: Cefepime 2g IV q12h Tamiflu 75 mg BID x 5 days Monitor labs, c/s, and patient improvement.  Height: 5\' 8"  (172.7 cm) Weight: 173 lb 1 oz (78.5 kg) IBW/kg (Calculated) : 68.4  Temp (24hrs), Avg:97.9 F (36.6 C), Min:97.5 F (36.4 C), Max:98.5 F (36.9 C)  Recent Labs  Lab 08/26/18 1921 08/26/18 1955 08/27/18 0531 08/28/18 1009 08/29/18 0457  WBC 5.8  --  3.2* 4.4 3.7*  CREATININE 1.03  --  0.93 1.09 0.92  LATICACIDVEN  --  1.7  --   --   --     Estimated Creatinine Clearance: 83.6 mL/min (by C-G formula based on SCr of 0.92 mg/dL).    Allergies  Allergen Reactions  . Albuterol Palpitations  . 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 Influenza Vaccine Live Swelling    Antimicrobials this admission: Zosyn 2/13>> 2/13 vancomycin 2/13 >>  2/16 cefepime 2/13>>   Microbiology results:  2/13 Sputum:  pending 2/13 MRSA PCR:  negative 2/13 Influenza PCR: Influenza A (+) 2/13 Strep Pneumo UAg: negative 2/13 Legionella:  pending  Thank you for allowing pharmacy to be a part of this patient's care.  Ramond Craver 08/29/2018 10:10 AM

## 2018-08-30 LAB — CBC WITH DIFFERENTIAL/PLATELET
Abs Immature Granulocytes: 0.04 10*3/uL (ref 0.00–0.07)
BASOS ABS: 0 10*3/uL (ref 0.0–0.1)
Basophils Relative: 0 %
Eosinophils Absolute: 0 10*3/uL (ref 0.0–0.5)
Eosinophils Relative: 0 %
HEMATOCRIT: 36.8 % — AB (ref 39.0–52.0)
Hemoglobin: 11.5 g/dL — ABNORMAL LOW (ref 13.0–17.0)
Immature Granulocytes: 1 %
LYMPHS PCT: 18 %
Lymphs Abs: 1 10*3/uL (ref 0.7–4.0)
MCH: 26.9 pg (ref 26.0–34.0)
MCHC: 31.3 g/dL (ref 30.0–36.0)
MCV: 86 fL (ref 80.0–100.0)
Monocytes Absolute: 0.3 10*3/uL (ref 0.1–1.0)
Monocytes Relative: 5 %
Neutro Abs: 4.2 10*3/uL (ref 1.7–7.7)
Neutrophils Relative %: 76 %
Platelets: 226 10*3/uL (ref 150–400)
RBC: 4.28 MIL/uL (ref 4.22–5.81)
RDW: 13.7 % (ref 11.5–15.5)
WBC: 5.5 10*3/uL (ref 4.0–10.5)
nRBC: 0 % (ref 0.0–0.2)

## 2018-08-30 LAB — BASIC METABOLIC PANEL
Anion gap: 10 (ref 5–15)
BUN: 21 mg/dL — AB (ref 6–20)
CHLORIDE: 95 mmol/L — AB (ref 98–111)
CO2: 28 mmol/L (ref 22–32)
Calcium: 8.3 mg/dL — ABNORMAL LOW (ref 8.9–10.3)
Creatinine, Ser: 0.95 mg/dL (ref 0.61–1.24)
GFR calc Af Amer: >60 mL/min (ref >60)
GFR calc non Af Amer: >60 mL/min (ref >60)
Glucose, Bld: 387 mg/dL — ABNORMAL HIGH (ref 70–99)
POTASSIUM: 4.9 mmol/L (ref 3.5–5.1)
Sodium: 133 mmol/L — ABNORMAL LOW (ref 135–145)

## 2018-08-30 LAB — GLUCOSE, CAPILLARY
GLUCOSE-CAPILLARY: 252 mg/dL — AB (ref 70–99)
Glucose-Capillary: 326 mg/dL — ABNORMAL HIGH (ref 70–99)
Glucose-Capillary: 343 mg/dL — ABNORMAL HIGH (ref 70–99)
Glucose-Capillary: 403 mg/dL — ABNORMAL HIGH (ref 70–99)
Glucose-Capillary: 204 mg/dL — ABNORMAL HIGH (ref 70–99)

## 2018-08-30 MED ORDER — INSULIN GLARGINE 100 UNIT/ML ~~LOC~~ SOLN
32.0000 [IU] | Freq: Every day | SUBCUTANEOUS | Status: DC
  Administered 2018-08-31 – 2018-09-01 (×2): 32 [IU] via SUBCUTANEOUS
  Filled 2018-08-30 (×4): qty 0.32

## 2018-08-30 MED ORDER — INSULIN ASPART 100 UNIT/ML ~~LOC~~ SOLN
5.0000 [IU] | Freq: Three times a day (TID) | SUBCUTANEOUS | Status: DC
  Administered 2018-08-30 – 2018-08-31 (×6): 5 [IU] via SUBCUTANEOUS

## 2018-08-30 MED ORDER — HYDROMORPHONE HCL 1 MG/ML IJ SOLN
0.5000 mg | INTRAMUSCULAR | Status: DC | PRN
  Administered 2018-08-30 – 2018-09-10 (×38): 0.5 mg via INTRAVENOUS
  Filled 2018-08-30 (×40): qty 0.5

## 2018-08-30 MED ORDER — CHLORHEXIDINE GLUCONATE 0.12 % MT SOLN
15.0000 mL | Freq: Two times a day (BID) | OROMUCOSAL | Status: DC
  Administered 2018-08-30 – 2018-09-10 (×23): 15 mL via OROMUCOSAL
  Filled 2018-08-30 (×21): qty 15

## 2018-08-30 MED ORDER — ORAL CARE MOUTH RINSE
15.0000 mL | Freq: Two times a day (BID) | OROMUCOSAL | Status: DC
  Administered 2018-08-30 – 2018-09-10 (×17): 15 mL via OROMUCOSAL

## 2018-08-30 NOTE — Progress Notes (Signed)
Subjective: He was admitted with healthcare associated pneumonia.  This is associated with influenza A.  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  He initially required BiPAP but has not required BiPAP in the last 48 hours or so.  He says he feels a little better.  He is still weak and still having some trouble getting to the bathroom on his own.  I had increased his pain medication but he says it is too strong so I am going to drop it back.  He has pain in his back chronically and he has pain in his chest because of the pneumonia.  No other new complaints.  Objective: Vital signs in last 24 hours: Temp:  [97.9 F (36.6 C)-98.2 F (36.8 C)] 98 F (36.7 C) (02/17 0738) Pulse Rate:  [52-101] 52 (02/17 0600) Resp:  [13-22] 14 (02/17 0600) BP: (114-149)/(75-117) 131/85 (02/17 0600) SpO2:  [96 %-100 %] 98 % (02/17 0600) Weight:  [78.7 kg] 78.7 kg (02/17 0500) Weight change: 0.2 kg Last BM Date: 08/29/18  Intake/Output from previous day: 02/16 0701 - 02/17 0700 In: 458.4 [P.O.:240; IV Piggyback:218.4] Out: 2100 [Urine:2100]  PHYSICAL EXAM General appearance: alert, cooperative and mild distress Resp: rhonchi bilaterally Cardio: regular rate and rhythm, S1, S2 normal, no murmur, click, rub or gallop GI: soft, non-tender; bowel sounds normal; no masses,  no organomegaly Extremities: extremities normal, atraumatic, no cyanosis or edema  Lab Results:  Results for orders placed or performed during the hospital encounter of 08/26/18 (from the past 48 hour(s))  CBC with Differential/Platelet     Status: Abnormal   Collection Time: 08/28/18 10:09 AM  Result Value Ref Range   WBC 4.4 4.0 - 10.5 K/uL   RBC 4.52 4.22 - 5.81 MIL/uL   Hemoglobin 12.1 (L) 13.0 - 17.0 g/dL   HCT 39.0 39.0 - 52.0 %   MCV 86.3 80.0 - 100.0 fL   MCH 26.8 26.0 - 34.0 pg   MCHC 31.0 30.0 - 36.0 g/dL   RDW 13.8 11.5 - 15.5 %    Platelets 198 150 - 400 K/uL   nRBC 0.0 0.0 - 0.2 %   Neutrophils Relative % 75 %   Neutro Abs 3.3 1.7 - 7.7 K/uL   Lymphocytes Relative 17 %   Lymphs Abs 0.7 0.7 - 4.0 K/uL   Monocytes Relative 7 %   Monocytes Absolute 0.3 0.1 - 1.0 K/uL   Eosinophils Relative 0 %   Eosinophils Absolute 0.0 0.0 - 0.5 K/uL   Basophils Relative 0 %   Basophils Absolute 0.0 0.0 - 0.1 K/uL   Immature Granulocytes 1 %   Abs Immature Granulocytes 0.02 0.00 - 0.07 K/uL    Comment: Performed at [facility], [address] [place], [place] [address]  Basic metabolic panel     Status: Abnormal   Collection Time: 08/28/18 10:09 AM  Result Value Ref Range   Sodium 137 135 - 145 mmol/L   Potassium 4.1 3.5 - 5.1 mmol/L   Chloride 99 98 - 111 mmol/L   CO2 26 22 - 32 mmol/L   Glucose, Bld 389 (H) 70 - 99 mg/dL   BUN 14 6 - 20 mg/dL   Creatinine, Ser 1.09 0.61 - 1.24 mg/dL   Calcium 8.7 (L) 8.9 - 10.3 mg/dL   GFR calc non Af Amer >60 >60 mL/min   GFR calc Af Amer >60 >60 mL/min   Anion gap 12 5 -  15    Comment: Performed at [facility], [address], [place], [place] [address]  Glucose, capillary     Status: Abnormal   Collection Time: 08/28/18 11:47 AM  Result Value Ref Range   Glucose-Capillary 284 (H) 70 - 99 mg/dL  Glucose, capillary     Status: Abnormal   Collection Time: 08/28/18  4:26 PM  Result Value Ref Range   Glucose-Capillary 272 (H) 70 - 99 mg/dL  Glucose, capillary     Status: Abnormal   Collection Time: 08/28/18  8:47 PM  Result Value Ref Range   Glucose-Capillary 408 (H) 70 - 99 mg/dL   Comment 1 Notify RN    Comment 2 Document in Chart   Glucose, capillary     Status: Abnormal   Collection Time: 08/29/18 12:00 AM  Result Value Ref Range   Glucose-Capillary 284 (H) 70 - 99 mg/dL   Comment 1 Notify RN    Comment 2 Document in Chart   CBC with Differential/Platelet     Status: Abnormal   Collection Time: 08/29/18  4:57 AM  Result Value Ref Range   WBC 3.7 (L) 4.0 - 10.5 K/uL    RBC 3.98 (L) 4.22 - 5.81 MIL/uL   Hemoglobin 10.8 (L) 13.0 - 17.0 g/dL   HCT 34.1 (L) 39.0 - 52.0 %   MCV 85.7 80.0 - 100.0 fL   MCH 27.1 26.0 - 34.0 pg   MCHC 31.7 30.0 - 36.0 g/dL   RDW 13.5 11.5 - 15.5 %   Platelets 203 150 - 400 K/uL   nRBC 0.0 0.0 - 0.2 %   Neutrophils Relative % 73 %   Neutro Abs 2.7 1.7 - 7.7 K/uL   Lymphocytes Relative 20 %   Lymphs Abs 0.7 0.7 - 4.0 K/uL   Monocytes Relative 6 %   Monocytes Absolute 0.2 0.1 - 1.0 K/uL   Eosinophils Relative 0 %   Eosinophils Absolute 0.0 0.0 - 0.5 K/uL   Basophils Relative 0 %   Basophils Absolute 0.0 0.0 - 0.1 K/uL   Immature Granulocytes 1 %   Abs Immature Granulocytes 0.03 0.00 - 0.07 K/uL   Reactive, Benign Lymphocytes PRESENT     Comment: Performed at [facility], [address] [place], [place] [address]  Basic metabolic panel     Status: Abnormal   Collection Time: 08/29/18  4:57 AM  Result Value Ref Range   Sodium 133 (L) 135 - 145 mmol/L   Potassium 4.7 3.5 - 5.1 mmol/L   Chloride 95 (L) 98 - 111 mmol/L   CO2 28 22 - 32 mmol/L   Glucose, Bld 348 (H) 70 - 99 mg/dL   BUN 15 6 - 20 mg/dL   Creatinine, Ser 0.92 0.61 - 1.24 mg/dL   Calcium 8.3 (L) 8.9 - 10.3 mg/dL   GFR calc non Af Amer >60 >60 mL/min   GFR calc Af Amer >60 >60 mL/min   Anion gap 10 5 - 15    Comment: Performed at [facility], [address] [place], [place] [address]  Glucose, capillary     Status: Abnormal   Collection Time: 08/29/18  7:59 AM  Result Value Ref Range   Glucose-Capillary 350 (H) 70 - 99 mg/dL  Glucose, capillary     Status: Abnormal   Collection Time: 08/29/18 11:26 AM  Result Value Ref Range   Glucose-Capillary 324 (H) 70 - 99 mg/dL  Glucose, capillary     Status: Abnormal   Collection Time: 08/29/18  4:14 PM  Result Value Ref Range   Glucose-Capillary 334 (H) 70 - 99 mg/dL   Comment 1 Notify RN    Comment 2 Document in Chart   Glucose, capillary     Status: Abnormal   Collection Time: 08/29/18  8:26 PM  Result  Value Ref Range   Glucose-Capillary 248 (H) 70 - 99 mg/dL   Comment 1 Notify RN    Comment 2 Document in Chart   Basic metabolic panel     Status: Abnormal   Collection Time: 08/30/18  4:19 AM  Result Value Ref Range   Sodium 133 (L) 135 - 145 mmol/L   Potassium 4.9 3.5 - 5.1 mmol/L   Chloride 95 (L) 98 - 111 mmol/L   CO2 28 22 - 32 mmol/L   Glucose, Bld 387 (H) 70 - 99 mg/dL   BUN 21 (H) 6 - 20 mg/dL   Creatinine, Ser 0.95 0.61 - 1.24 mg/dL   Calcium 8.3 (L) 8.9 - 10.3 mg/dL   GFR calc non Af Amer >60 >60 mL/min   GFR calc Af Amer >60 >60 mL/min   Anion gap 10 5 - 15    Comment: Performed at [facility], [address] [place], [place] [address]  CBC with Differential/Platelet     Status: Abnormal   Collection Time: 08/30/18  4:19 AM  Result Value Ref Range   WBC 5.5 4.0 - 10.5 K/uL   RBC 4.28 4.22 - 5.81 MIL/uL   Hemoglobin 11.5 (L) 13.0 - 17.0 g/dL   HCT 36.8 (L) 39.0 - 52.0 %   MCV 86.0 80.0 - 100.0 fL   MCH 26.9 26.0 - 34.0 pg   MCHC 31.3 30.0 - 36.0 g/dL   RDW 13.7 11.5 - 15.5 %   Platelets 226 150 - 400 K/uL   nRBC 0.0 0.0 - 0.2 %   Neutrophils Relative % 76 %   Neutro Abs 4.2 1.7 - 7.7 K/uL   Lymphocytes Relative 18 %   Lymphs Abs 1.0 0.7 - 4.0 K/uL   Monocytes Relative 5 %   Monocytes Absolute 0.3 0.1 - 1.0 K/uL   Eosinophils Relative 0 %   Eosinophils Absolute 0.0 0.0 - 0.5 K/uL   Basophils Relative 0 %   Basophils Absolute 0.0 0.0 - 0.1 K/uL   Immature Granulocytes 1 %   Abs Immature Granulocytes 0.04 0.00 - 0.07 K/uL    Comment: Performed at [facility], [address], [place], [place] [address]  Glucose, capillary     Status: Abnormal   Collection Time: 08/30/18  7:40 AM  Result Value Ref Range   Glucose-Capillary 343 (H) 70 - 99 mg/dL    ABGS No results for input(s): PHART, PO2ART, TCO2, HCO3 in the last 72 hours.  Invalid input(s): PCO2 CULTURES Recent Results (from the past 240 hour(s))  Culture, blood (Routine X 2) w Reflex to ID Panel      Status: None (Preliminary result)   Collection Time: 08/26/18  8:34 PM  Result Value Ref Range Status   Specimen Description BLOOD RIGHT ANTECUBITAL  Final   Special Requests   Final    BOTTLES DRAWN AEROBIC AND ANAEROBIC Blood Culture adequate volume   Culture   Final    NO GROWTH 4 DAYS Performed at [facility], [address] [place], [place] [address]    Report Status PENDING  Incomplete  Culture, blood (Routine X 2) w Reflex to ID Panel     Status: None (Preliminary result)  Collection Time: 08/26/18  8:38 PM  Result Value Ref Range Status   Specimen Description BLOOD RIGHT HAND  Final   Special Requests   Final    BOTTLES DRAWN AEROBIC AND ANAEROBIC Blood Culture adequate volume   Culture   Final    NO GROWTH 4 DAYS Performed at [facility], [address], [place], [place] [address]    Report Status PENDING  Incomplete  MRSA PCR Screening     Status: None   Collection Time: 08/27/18  4:00 PM  Result Value Ref Range Status   MRSA by PCR NEGATIVE NEGATIVE Final    Comment:        The GeneXpert MRSA Assay (FDA approved for NASAL specimens only), is one component of a comprehensive MRSA colonization surveillance program. It is not intended to diagnose MRSA infection nor to guide or monitor treatment for MRSA infections. Performed at [facility], [address], [place], [place] [address]    Studies/Results: No results found.  Medications:  Prior to Admission:  Medications Prior to Admission  Medication Sig Dispense Refill Last Dose  . albuterol (VENTOLIN HFA) 108 (90 BASE) MCG/ACT inhaler Inhale 2 puffs into the lungs every 6 (six) hours as needed for wheezing or shortness of breath.   08/26/2018  . azithromycin (ZITHROMAX) 500 MG tablet Take 500 mg by mouth daily.   08/26/2018  . baclofen (LIORESAL) 20 MG tablet Take 20 mg by mouth 3 (three) times daily.    08/26/2018  . benzonatate (TESSALON) 200 MG capsule Take 200 mg by mouth 3 (three) times daily.   08/26/2018   . budesonide (PULMICORT) 0.25 MG/2ML nebulizer solution Take 2 mLs (0.25 mg total) by nebulization 2 (two) times daily. 60 mL 12 08/26/2018  . CARTIA XT 120 MG 24 hr capsule Take 1 capsule (120 mg total) by mouth daily. 7 capsule 0 08/26/2018  . diphenhydrAMINE (BENADRYL) 25 mg capsule Take 25 mg by mouth every 8 (eight) hours as needed. Felt mouth swelling at ED visit 08/16/18.   Past Week at Unknown time  . fexofenadine (ALLEGRA) 180 MG tablet Take 180 mg by mouth daily.   Past Week at Unknown time  . fluticasone (FLONASE) 50 MCG/ACT nasal spray Place 2 sprays into both nostrils daily as needed.   5 08/26/2018  . Fluticasone-Umeclidin-Vilant (TRELEGY ELLIPTA) 100-62.5-25 MCG/INH AEPB Inhale 1 puff into the lungs daily.   08/26/2018  . HYDROcodone-homatropine (HYCODAN) 5-1.5 MG/5ML syrup Take 5 mLs by mouth every 6 (six) hours as needed for cough.   08/26/2018  . insulin glargine (LANTUS) 100 UNIT/ML injection Inject 0.24 mLs (24 Units total) into the skin daily. 24 units daily (Patient taking differently: Inject 32 Units into the skin daily. ) 10 mL 11 08/26/2018  . insulin lispro (HUMALOG) 100 UNIT/ML injection Take by sliding scale provided by hospital maximum amount daily is 60 units.  Check blood sugar 4 times a day (Patient taking differently: Inject 0-15 Units into the skin 3 (three) times daily with meals. Take by sliding scale provided by hospital maximum amount daily is 60 units.  Check blood sugar 4 times a day) 10 mL 11 08/26/2018  . ipratropium (ATROVENT) 0.02 % nebulizer solution Take 0.5 mg by nebulization 2 (two) times daily.   5 08/26/2018  . levalbuterol (XOPENEX) 0.63 MG/3ML nebulizer solution Take 0.63 mg by nebulization 2 (two) times daily.   08/26/2018  . levofloxacin (LEVAQUIN) 500 MG tablet Take 500 mg by mouth daily.   08/26/2018  . LORazepam (  ATIVAN) 0.5 MG tablet Take 1 tablet (0.5 mg total) by mouth 2 (two) times daily as needed for anxiety or sleep. (Patient taking differently: Take  0.5 mg by mouth 4 (four) times daily. Pt takes it three times daily.) 20 tablet 0 08/26/2018  . metFORMIN (GLUCOPHAGE) 500 MG tablet Take 1 tablet (500 mg total) by mouth daily with breakfast. 30 tablet 3 08/26/2018  . nitroGLYCERIN (NITROSTAT) 0.4 MG SL tablet Place 1 tablet (0.4 mg total) under the tongue every 5 (five) minutes as needed for chest pain. 25 tablet 4 08/26/2018  . omeprazole (PRILOSEC) 20 MG capsule Take 1 capsule (20 mg total) by mouth 2 (two) times daily before a meal. 180 capsule 3 08/26/2018  . oxyCODONE-acetaminophen (PERCOCET) 10-325 MG tablet Take 1 tablet by mouth every 4 (four) hours as needed for pain.   08/26/2018  . predniSONE (DELTASONE) 10 MG tablet Take 2 daily for 1 week then 1 daily (Patient taking differently: Take 20 mg by mouth daily with breakfast. Takes 2 tablets daily.) 60 tablet 5 08/26/2018  . rifampin (RIFADIN) 300 MG capsule Take 2 capsules (600 mg total) by mouth daily. 60 capsule 11 08/26/2018  . rosuvastatin (CRESTOR) 10 MG tablet Take 10 mg by mouth every morning.   11 08/26/2018  . predniSONE (DELTASONE) 10 MG tablet Take 2 tablets (20 mg total) by mouth daily. 6 tablet 0 Taking   Scheduled: . baclofen  20 mg Oral TID  . benzonatate  200 mg Oral TID  . chlorhexidine  15 mL Mouth Rinse BID  . diltiazem  120 mg Oral Daily  . enoxaparin (LOVENOX) injection  40 mg Subcutaneous Q24H  . fluticasone  2 spray Each Nare Daily  . fluticasone furoate-vilanterol  1 puff Inhalation Daily   And  . umeclidinium bromide  1 puff Inhalation Daily  . guaiFENesin  1,200 mg Oral BID  . insulin aspart  0-15 Units Subcutaneous TID WC  . insulin aspart  0-5 Units Subcutaneous QHS  . insulin aspart  5 Units Subcutaneous TID WC  . insulin glargine  25 Units Subcutaneous Daily  . ipratropium  0.5 mg Nebulization Q6H  . levalbuterol  0.63 mg Nebulization Q6H  . mouth rinse  15 mL Mouth Rinse q12n4p  . methylPREDNISolone (SOLU-MEDROL) injection  60 mg Intravenous Q6H  .  oseltamivir  75 mg Oral BID  . oxymetazoline  1 spray Each Nare BID  . pantoprazole  40 mg Oral Daily  . rosuvastatin  10 mg Oral q1800   Continuous: . sodium chloride 90 mL/hr at 08/26/18 2220  . ceFEPime (MAXIPIME) IV Stopped (08/30/18 0201)   DTH:YHOOIL chloride, alum & mag hydroxide-simeth, diphenhydrAMINE, HYDROmorphone, LORazepam, oxyCODONE-acetaminophen **AND** oxyCODONE, sodium chloride  Assesment: He was admitted with healthcare associated pneumonia.  He is improving.  His breathing is better.  He remains on cefepime and vancomycin has been discontinued  He has acute on chronic hypoxic respiratory failure and we have been able to decrease his oxygen and is almost back at baseline.  As mentioned he has not required BiPAP  He has influenza A and he is being treated for that  He has severe COPD at baseline and he is on steroids and inhaled bronchodilators and seems to be improving  He has pulmonary mycobacterial avium infection and he had been on triple antibiotics but ethambutol has been discontinued because of his visual problems  He has diabetes and I have added mealtime insulin  He has had trouble with chronic diastolic heart  failure but he is not showing any evidence of heart failure now Principal Problem:   HCAP (healthcare-associated pneumonia) Active Problems:   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  quit smoking 05/01/15    Type 2 diabetes mellitus without complication (HCC)   COPD exacerbation (HCC)   Pulmonary fibrosis (HCC)   Acute on chronic respiratory failure with hypoxia ([place])    Plan: I think he is okay to transfer to telemetry bed.    LOS: 4 days   Alonza Bogus 08/30/2018, 8:11 AM

## 2018-08-30 NOTE — Progress Notes (Signed)
Inpatient Diabetes Program Recommendations  AACE/ADA: New Consensus Statement on Inpatient Glycemic Control (2015)  Target Ranges:  Prepandial:   less than 140 mg/dL      Peak postprandial:   less than 180 mg/dL (1-2 hours)      Critically ill patients:  140 - 180 mg/dL   Lab Results  Component Value Date   GLUCAP 343 (H) 08/30/2018   HGBA1C 8.7 (H) 11/28/2017    Review of Glycemic Control Results for Ryan [name], Ryan [name] (MRN [id number]) as of 08/30/2018 10:11  Ref. Range 08/29/2018 07:59 08/29/2018 11:26 08/29/2018 16:14 08/29/2018 20:26 08/30/2018 07:40  Glucose-Capillary Latest Ref Range: 70 - 99 mg/dL 350 (H) 324 (H) 334 (H) 248 (H) 343 (H)   Outpatient Diabetes medications: Lantus 32 units daily, Humalog 0-15 units sliding scale 4 times/day Current orders for Inpatient glycemic control: Lantus 25 units daily, Novolog 0-15 units TID & HS  Inpatient Diabetes Program Recommendations:   -Increase Lantus to 32 units daily -Increase Novolog meal coverage to 8 units tid  Thank you, Nani Gasser. Tobyn Osgood, RN, MSN, CDE  Diabetes Coordinator Inpatient Glycemic Control Team Team Pager [phone] (8am-5pm) 08/30/2018 10:12 AM\

## 2018-08-30 NOTE — Progress Notes (Signed)
Patient not using BIPAP at night. No unit in room at this time.

## 2018-08-30 NOTE — Plan of Care (Signed)
  Problem: Acute Rehab PT Goals(only PT should resolve) Goal: Pt Will Go Supine/Side To Sit Outcome: Progressing Flowsheets (Taken 08/30/2018 1500) Pt will go Supine/Side to Sit: with modified independence Goal: Patient Will Transfer Sit To/From Stand Outcome: Progressing Flowsheets (Taken 08/30/2018 1500) Patient will transfer sit to/from stand: with supervision Goal: Pt Will Transfer Bed To Chair/Chair To Bed Outcome: Progressing Flowsheets (Taken 08/30/2018 1500) Pt will Transfer Bed to Chair/Chair to Bed: with supervision Goal: Pt Will Ambulate Outcome: Progressing Flowsheets (Taken 08/30/2018 1500) Pt will Ambulate: 25 feet; with min guard assist; with least restrictive assistive device Goal: Pt Will Go Up/Down Stairs Outcome: Progressing Flowsheets (Taken 08/30/2018 1500) Pt will Go Up / Down Stairs: 6-9 stairs; with min guard assist; with rail(s)   3:01 PM, 08/30/18 Talbot Grumbling, DPT Physical Therapist with [facility] [phone] office

## 2018-08-30 NOTE — Progress Notes (Signed)
Pt is still eating breakfast. I will check with pt later to give meds

## 2018-08-30 NOTE — Evaluation (Signed)
Physical Therapy Evaluation Patient Details Name: Ryan [name] MRN: [id number] DOB: 04-23-1961 Today's Date: 08/30/2018   History of Present Illness  Ryan [name] is a 58 y.o. male with medical history significant for severe COPD with chronic hypoxic respiratory failure, history of paroxysmal atrial fibrillation not on anticoagulation, insulin-dependent diabetes mellitus, and chronic pain, now presenting to the emergency department for evaluation of increase in his chronic shortness of breath and wheezing, increased productive cough.  Patient reports that his son and wife had been ill recently with respiratory symptoms, patient himself then developed increase in his chronic cough, shortness of breath, and wheezing several days ago.  He has progressively worsened.  He denies any chest pain.  He reports some mild chronic bilateral lower extremity swelling that is unchanged.  He has had a sore throat, but not much rhinorrhea.  He is not sure of his family members were tested for influenza or not.  He called EMS, was found to be saturating in the 80s on his usual 6 L/min supplemental oxygen, treated with Xopenex and 125 mg of IV Solu-Medrol, placed on CPAP, and transported to the ED.    Clinical Impression  Patient presents seated at EOB with spouse in room and is agreeable to therapy. Patient near baseline for functional mobility and gait, slightly limited secondary to generalized weakness and fatigue with activity. Pt requires min assist with transfers and ambulation using RW, min assist for RW management secondary to blindness, on 6 LPM and O2 sat 90-99% with functional mobility, no loss of balance, and mild shortness of breath noted.Pt limited secondary to generalized weakness and deconditioning. Pt tolerates sitting up in chair with spouse at bedside, call bell in reach, and RT entering room for breathing treatment. Patient will benefit from continued physical therapy in hospital and recommended venue  below to increase strength, balance, endurance for safe ADLs and gait.     Follow Up Recommendations Home health PT;Supervision - Intermittent    Equipment Recommendations  None recommended by PT    Recommendations for Other Services       Precautions / Restrictions Precautions Precautions: Fall Restrictions Weight Bearing Restrictions: No      Mobility  Bed Mobility Overal bed mobility: Needs Assistance Bed Mobility: Supine to Sit;Sit to Supine     Supine to sit: Min guard Sit to supine: Min guard   General bed mobility comments: increased time, elevated HOB because pt reports he never lays "flat"  Transfers Overall transfer level: Needs assistance   Transfers: Sit to/from Stand;Stand Pivot Transfers Sit to Stand: Min assist Stand pivot transfers: Min assist       General transfer comment: increased time, elevated bed, min assist for RW management  Ambulation/Gait Ambulation/Gait assistance: Min assist Gait Distance (Feet): 12 Feet Assistive device: Rolling walker (2 wheeled) Gait Pattern/deviations: Decreased step length - right;Decreased step length - left;Decreased stride length Gait velocity: decreased   General Gait Details: Pt with decreased heel-toe pattern, min assist for RW management to steer safely due to low vision/blind, no loss of balance  Stairs            Wheelchair Mobility    Modified Rankin (Stroke Patients Only)       Balance Overall balance assessment: Needs assistance Sitting-balance support: Feet supported;No upper extremity supported Sitting balance-Leahy Scale: Good Sitting balance - Comments: seated edge of bed   Standing balance support: During functional activity;Bilateral upper extremity supported Standing balance-Leahy Scale: Fair Standing balance comment: with RW  Pertinent Vitals/Pain Pain Assessment: 0-10(Pt reports "I have pain all the time") Pain Location: back Pain  Descriptors / Indicators: Other (Comment)(chronic) Pain Intervention(s): Limited activity within patient's tolerance;Monitored during session;Repositioned    Home Living Family/patient expects to be discharged to:: Private residence Living Arrangements: Spouse/significant other;Children Available Help at Discharge: Family;Available 24 hours/day Type of Home: Mobile home Home Access: Stairs to enter Entrance Stairs-Rails: Right;Left;Can reach both Entrance Stairs-Number of Steps: 6-8 Home Layout: One level Home Equipment: Walker - 2 wheels;Cane - single point;Bedside commode;Wheelchair - manual      Prior Function Level of Independence: Needs assistance   Gait / Transfers Assistance Needed: Pt reports being a limited household ambulator, hand held assist or "furniture walks"  ADL's / Homemaking Assistance Needed: Pt reports spouse assists with all ADLs, drives, and occasional physical assistance for transfers        Hand Dominance        Extremity/Trunk Assessment   Upper Extremity Assessment Upper Extremity Assessment: Overall WFL for tasks assessed    Lower Extremity Assessment Lower Extremity Assessment: Generalized weakness    Cervical / Trunk Assessment Cervical / Trunk Assessment: Normal  Communication   Communication: No difficulties  Cognition Arousal/Alertness: Awake/alert Behavior During Therapy: WFL for tasks assessed/performed Overall Cognitive Status: Within Functional Limits for tasks assessed                                        General Comments      Exercises     Assessment/Plan    PT Assessment Patient needs continued PT services  PT Problem List Decreased strength;Decreased activity tolerance;Decreased balance;Decreased mobility       PT Treatment Interventions Gait training;Stair training;Functional mobility training;Therapeutic activities;Therapeutic exercise;Balance training;Patient/family education    PT Goals  (Current goals can be found in the Care Plan section)  Acute Rehab PT Goals Patient Stated Goal: return home PT Goal Formulation: With patient/family Time For Goal Achievement: 09/04/18 Potential to Achieve Goals: Good    Frequency Min 2X/week   Barriers to discharge        Co-evaluation               AM-PAC PT "6 Clicks" Mobility  Outcome Measure Help needed turning from your back to your side while in a flat bed without using bedrails?: A Little Help needed moving from lying on your back to sitting on the side of a flat bed without using bedrails?: A Little Help needed moving to and from a bed to a chair (including a wheelchair)?: A Little Help needed standing up from a chair using your arms (e.g., wheelchair or bedside chair)?: A Little Help needed to walk in hospital room?: A Little Help needed climbing 3-5 steps with a railing? : A Lot 6 Click Score: 17    End of Session Equipment Utilized During Treatment: Gait belt;Oxygen(6 LPM) Activity Tolerance: Patient tolerated treatment well;No increased pain;Patient limited by pain Patient left: in chair;with call bell/phone within reach;with family/visitor present;Other (comment)(spouse at bedside and RT entering room) Nurse Communication: Mobility status PT Visit Diagnosis: Unsteadiness on feet (R26.81);Other abnormalities of gait and mobility (R26.89);Muscle weakness (generalized) (M62.81)    Time: 0225-0250 PT Time Calculation (min) (ACUTE ONLY): 25 min   Charges:   PT Evaluation $PT Eval Moderate Complexity: 1 Mod PT Treatments $Gait Training: 8-22 mins $Therapeutic Activity: 8-22 mins        3:00 PM,  08/30/18 Talbot Grumbling, DPT Physical Therapist with [facility] [phone] office

## 2018-08-31 ENCOUNTER — Ambulatory Visit: Payer: Medicare Other | Admitting: Internal Medicine

## 2018-08-31 LAB — CULTURE, BLOOD (ROUTINE X 2)
Culture: NO GROWTH
Culture: NO GROWTH
SPECIAL REQUESTS: ADEQUATE
Special Requests: ADEQUATE

## 2018-08-31 LAB — GLUCOSE, CAPILLARY
Glucose-Capillary: 333 mg/dL — ABNORMAL HIGH (ref 70–99)
Glucose-Capillary: 471 mg/dL — ABNORMAL HIGH (ref 70–99)
Glucose-Capillary: 203 mg/dL — ABNORMAL HIGH (ref 70–99)
Glucose-Capillary: 399 mg/dL — ABNORMAL HIGH (ref 70–99)

## 2018-08-31 NOTE — Progress Notes (Signed)
Attempted to give nebulizer treatments but pt is still eating

## 2018-08-31 NOTE — Progress Notes (Signed)
Subjective: He says he feels better.  He was able to get up and walk around in the room a little bit yesterday.  He is still coughing.  No other new complaints.  Objective: Vital signs in last 24 hours: Temp:  [97.8 F (36.6 C)-99.4 F (37.4 C)] 98.9 F (37.2 C) (02/18 0400) Pulse Rate:  [54-112] 84 (02/18 0800) Resp:  [12-23] 19 (02/18 0800) BP: (123-150)/(67-102) 135/102 (02/17 2200) SpO2:  [89 %-100 %] 94 % (02/18 0800) Weight:  [78.9 kg] 78.9 kg (02/18 0500) Weight change: 0.2 kg Last BM Date: 08/29/18  Intake/Output from previous day: 02/17 0701 - 02/18 0700 In: 630 [P.O.:480; IV Piggyback:150] Out: 2050 [Urine:2050]  PHYSICAL EXAM General appearance: alert, cooperative and no distress Resp: He still has rales on the left Cardio: regular rate and rhythm, S1, S2 normal, no murmur, click, rub or gallop GI: soft, non-tender; bowel sounds normal; no masses,  no organomegaly Extremities: extremities normal, atraumatic, no cyanosis or edema  Lab Results:  Results for orders placed or performed during the hospital encounter of 08/26/18 (from the past 48 hour(s))  Glucose, capillary     Status: Abnormal   Collection Time: 08/29/18 11:26 AM  Result Value Ref Range   Glucose-Capillary 324 (H) 70 - 99 mg/dL  Glucose, capillary     Status: Abnormal   Collection Time: 08/29/18  4:14 PM  Result Value Ref Range   Glucose-Capillary 334 (H) 70 - 99 mg/dL   Comment 1 Notify RN    Comment 2 Document in Chart   Glucose, capillary     Status: Abnormal   Collection Time: 08/29/18  8:26 PM  Result Value Ref Range   Glucose-Capillary 248 (H) 70 - 99 mg/dL   Comment 1 Notify RN    Comment 2 Document in Chart   Basic metabolic panel     Status: Abnormal   Collection Time: 08/30/18  4:19 AM  Result Value Ref Range   Sodium 133 (L) 135 - 145 mmol/L   Potassium 4.9 3.5 - 5.1 mmol/L   Chloride 95 (L) 98 - 111 mmol/L   CO2 28 22 - 32 mmol/L   Glucose, Bld 387 (H) 70 - 99 mg/dL   BUN 21  (H) 6 - 20 mg/dL   Creatinine, Ser 0.95 0.61 - 1.24 mg/dL   Calcium 8.3 (L) 8.9 - 10.3 mg/dL   GFR calc non Af Amer >60 >60 mL/min   GFR calc Af Amer >60 >60 mL/min   Anion gap 10 5 - 15    Comment: Performed at [facility], [address] [place], [place] [address]  CBC with Differential/Platelet     Status: Abnormal   Collection Time: 08/30/18  4:19 AM  Result Value Ref Range   WBC 5.5 4.0 - 10.5 K/uL   RBC 4.28 4.22 - 5.81 MIL/uL   Hemoglobin 11.5 (L) 13.0 - 17.0 g/dL   HCT 36.8 (L) 39.0 - 52.0 %   MCV 86.0 80.0 - 100.0 fL   MCH 26.9 26.0 - 34.0 pg   MCHC 31.3 30.0 - 36.0 g/dL   RDW 13.7 11.5 - 15.5 %   Platelets 226 150 - 400 K/uL   nRBC 0.0 0.0 - 0.2 %   Neutrophils Relative % 76 %   Neutro Abs 4.2 1.7 - 7.7 K/uL   Lymphocytes Relative 18 %   Lymphs Abs 1.0 0.7 - 4.0 K/uL   Monocytes Relative 5 %   Monocytes Absolute 0.3 0.1 - 1.0 K/uL  Eosinophils Relative 0 %   Eosinophils Absolute 0.0 0.0 - 0.5 K/uL   Basophils Relative 0 %   Basophils Absolute 0.0 0.0 - 0.1 K/uL   Immature Granulocytes 1 %   Abs Immature Granulocytes 0.04 0.00 - 0.07 K/uL    Comment: Performed at [facility], [address], [place], [place] [address]  Glucose, capillary     Status: Abnormal   Collection Time: 08/30/18  7:40 AM  Result Value Ref Range   Glucose-Capillary 343 (H) 70 - 99 mg/dL  Glucose, capillary     Status: Abnormal   Collection Time: 08/30/18 11:06 AM  Result Value Ref Range   Glucose-Capillary 403 (H) 70 - 99 mg/dL  Glucose, capillary     Status: Abnormal   Collection Time: 08/30/18  4:22 PM  Result Value Ref Range   Glucose-Capillary 204 (H) 70 - 99 mg/dL  Glucose, capillary     Status: Abnormal   Collection Time: 08/30/18  9:21 PM  Result Value Ref Range   Glucose-Capillary 252 (H) 70 - 99 mg/dL  Glucose, capillary     Status: Abnormal   Collection Time: 08/31/18  7:54 AM  Result Value Ref Range   Glucose-Capillary 333 (H) 70 - 99 mg/dL    ABGS No results for  input(s): PHART, PO2ART, TCO2, HCO3 in the last 72 hours.  Invalid input(s): PCO2 CULTURES Recent Results (from the past 240 hour(s))  Culture, blood (Routine X 2) w Reflex to ID Panel     Status: None   Collection Time: 08/26/18  8:34 PM  Result Value Ref Range Status   Specimen Description BLOOD RIGHT ANTECUBITAL  Final   Special Requests   Final    BOTTLES DRAWN AEROBIC AND ANAEROBIC Blood Culture adequate volume   Culture   Final    NO GROWTH 5 DAYS Performed at [facility], [address] [place], [place] [address]    Report Status 08/31/2018 FINAL  Final  Culture, blood (Routine X 2) w Reflex to ID Panel     Status: None   Collection Time: 08/26/18  8:38 PM  Result Value Ref Range Status   Specimen Description BLOOD RIGHT HAND  Final   Special Requests   Final    BOTTLES DRAWN AEROBIC AND ANAEROBIC Blood Culture adequate volume   Culture   Final    NO GROWTH 5 DAYS Performed at [facility], [address] [place], [place] [address]    Report Status 08/31/2018 FINAL  Final  MRSA PCR Screening     Status: None   Collection Time: 08/27/18  4:00 PM  Result Value Ref Range Status   MRSA by PCR NEGATIVE NEGATIVE Final    Comment:        The GeneXpert MRSA Assay (FDA approved for NASAL specimens only), is one component of a comprehensive MRSA colonization surveillance program. It is not intended to diagnose MRSA infection nor to guide or monitor treatment for MRSA infections. Performed at [facility], [address] [place], [place] [address]    Studies/Results: No results found.  Medications:  Prior to Admission:  Medications Prior to Admission  Medication Sig Dispense Refill Last Dose  . albuterol (VENTOLIN HFA) 108 (90 BASE) MCG/ACT inhaler Inhale 2 puffs into the lungs every 6 (six) hours as needed for wheezing or shortness of breath.   08/26/2018  . azithromycin (ZITHROMAX) 500 MG tablet Take 500 mg by mouth daily.   08/26/2018  . baclofen (LIORESAL) 20 MG  tablet Take 20  mg by mouth 3 (three) times daily.    08/26/2018  . benzonatate (TESSALON) 200 MG capsule Take 200 mg by mouth 3 (three) times daily.   08/26/2018  . budesonide (PULMICORT) 0.25 MG/2ML nebulizer solution Take 2 mLs (0.25 mg total) by nebulization 2 (two) times daily. 60 mL 12 08/26/2018  . CARTIA XT 120 MG 24 hr capsule Take 1 capsule (120 mg total) by mouth daily. 7 capsule 0 08/26/2018  . diphenhydrAMINE (BENADRYL) 25 mg capsule Take 25 mg by mouth every 8 (eight) hours as needed. Felt mouth swelling at ED visit 08/16/18.   Past Week at Unknown time  . fexofenadine (ALLEGRA) 180 MG tablet Take 180 mg by mouth daily.   Past Week at Unknown time  . fluticasone (FLONASE) 50 MCG/ACT nasal spray Place 2 sprays into both nostrils daily as needed.   5 08/26/2018  . Fluticasone-Umeclidin-Vilant (TRELEGY ELLIPTA) 100-62.5-25 MCG/INH AEPB Inhale 1 puff into the lungs daily.   08/26/2018  . HYDROcodone-homatropine (HYCODAN) 5-1.5 MG/5ML syrup Take 5 mLs by mouth every 6 (six) hours as needed for cough.   08/26/2018  . insulin glargine (LANTUS) 100 UNIT/ML injection Inject 0.24 mLs (24 Units total) into the skin daily. 24 units daily (Patient taking differently: Inject 32 Units into the skin daily. ) 10 mL 11 08/26/2018  . insulin lispro (HUMALOG) 100 UNIT/ML injection Take by sliding scale provided by hospital maximum amount daily is 60 units.  Check blood sugar 4 times a day (Patient taking differently: Inject 0-15 Units into the skin 3 (three) times daily with meals. Take by sliding scale provided by hospital maximum amount daily is 60 units.  Check blood sugar 4 times a day) 10 mL 11 08/26/2018  . ipratropium (ATROVENT) 0.02 % nebulizer solution Take 0.5 mg by nebulization 2 (two) times daily.   5 08/26/2018  . levalbuterol (XOPENEX) 0.63 MG/3ML nebulizer solution Take 0.63 mg by nebulization 2 (two) times daily.   08/26/2018  . levofloxacin (LEVAQUIN) 500 MG tablet Take 500 mg by mouth daily.   08/26/2018   . LORazepam (ATIVAN) 0.5 MG tablet Take 1 tablet (0.5 mg total) by mouth 2 (two) times daily as needed for anxiety or sleep. (Patient taking differently: Take 0.5 mg by mouth 4 (four) times daily. Pt takes it three times daily.) 20 tablet 0 08/26/2018  . metFORMIN (GLUCOPHAGE) 500 MG tablet Take 1 tablet (500 mg total) by mouth daily with breakfast. 30 tablet 3 08/26/2018  . nitroGLYCERIN (NITROSTAT) 0.4 MG SL tablet Place 1 tablet (0.4 mg total) under the tongue every 5 (five) minutes as needed for chest pain. 25 tablet 4 08/26/2018  . omeprazole (PRILOSEC) 20 MG capsule Take 1 capsule (20 mg total) by mouth 2 (two) times daily before a meal. 180 capsule 3 08/26/2018  . oxyCODONE-acetaminophen (PERCOCET) 10-325 MG tablet Take 1 tablet by mouth every 4 (four) hours as needed for pain.   08/26/2018  . predniSONE (DELTASONE) 10 MG tablet Take 2 daily for 1 week then 1 daily (Patient taking differently: Take 20 mg by mouth daily with breakfast. Takes 2 tablets daily.) 60 tablet 5 08/26/2018  . rifampin (RIFADIN) 300 MG capsule Take 2 capsules (600 mg total) by mouth daily. 60 capsule 11 08/26/2018  . rosuvastatin (CRESTOR) 10 MG tablet Take 10 mg by mouth every morning.   11 08/26/2018  . predniSONE (DELTASONE) 10 MG tablet Take 2 tablets (20 mg total) by mouth daily. 6 tablet 0 Taking   Scheduled: . baclofen  20 mg Oral TID  . benzonatate  200 mg Oral TID  . chlorhexidine  15 mL Mouth Rinse BID  . diltiazem  120 mg Oral Daily  . enoxaparin (LOVENOX) injection  40 mg Subcutaneous Q24H  . fluticasone  2 spray Each Nare Daily  . fluticasone furoate-vilanterol  1 puff Inhalation Daily   And  . umeclidinium bromide  1 puff Inhalation Daily  . guaiFENesin  1,200 mg Oral BID  . insulin aspart  0-15 Units Subcutaneous TID WC  . insulin aspart  0-5 Units Subcutaneous QHS  . insulin aspart  5 Units Subcutaneous TID WC  . insulin glargine  32 Units Subcutaneous Daily  . ipratropium  0.5 mg Nebulization Q6H  .  levalbuterol  0.63 mg Nebulization Q6H  . mouth rinse  15 mL Mouth Rinse q12n4p  . methylPREDNISolone (SOLU-MEDROL) injection  60 mg Intravenous Q6H  . oseltamivir  75 mg Oral BID  . oxymetazoline  1 spray Each Nare BID  . pantoprazole  40 mg Oral Daily  . rosuvastatin  10 mg Oral q1800   Continuous: . sodium chloride 90 mL/hr at 08/26/18 2220  . ceFEPime (MAXIPIME) IV Stopped (08/31/18 0133)   OEH:OZYYQM chloride, alum & mag hydroxide-simeth, diphenhydrAMINE, HYDROmorphone, LORazepam, oxyCODONE-acetaminophen **AND** oxyCODONE, sodium chloride  Assesment: He is admitted with healthcare associated pneumonia.  He is improving.  He also has influenza A and is being treated for that.  His pneumonia is definitely improving  At baseline he has COPD which is severe and he has an exacerbation which is being treated  He has acute on chronic hypoxic respiratory failure but he is approaching his normal oxygen flow rate  He has diabetes and I have adjusted his insulin yesterday.  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  He has transfer orders but there was nowhere to put him yesterday on the floor.  Probable discharge in the next 48 hours    LOS: 5 days   Alonza Bogus 08/31/2018, 9:07 AM

## 2018-08-31 NOTE — Progress Notes (Signed)
Inpatient Diabetes Program Recommendations  AACE/ADA: New Consensus Statement on Inpatient Glycemic Control   Target Ranges:  Prepandial:   less than 140 mg/dL      Peak postprandial:   less than 180 mg/dL (1-2 hours)      Critically ill patients:  140 - 180 mg/dL  Results for Ryan [name], Ryan [name] (MRN [id number]) as of 08/31/2018 09:25  Ref. Range 08/30/2018 07:40 08/30/2018 11:06 08/30/2018 16:22 08/30/2018 21:21 08/31/2018 07:54  Glucose-Capillary Latest Ref Range: 70 - 99 mg/dL 343 (H)  Novolog 16 units  Lantus 25 units 403 (H)  Novolog 20 units 204 (H)  Novolog 10 units 252 (H)  Novolog 3 units 333 (H)  Novolog 16 units    Review of Glycemic Control  Diabetes history: DM2 Outpatient Diabetes medications: Lantus 32 units daily, Humalog 0-15 units QID, Metformin 500 mg QAM; Prednisone 20 mg daily Current orders for Inpatient glycemic control: Lantus 32 units daily, Novolog 5 units TID with meals, Novolog 0-15 units TID with meals, Novolog 0-5 units QHS; Solumedrol 60 mg Q6H  Inpatient Diabetes Program Recommendations:  Insulin - Basal: Noted Lantus increased from 25 to 32 units daily today. Insulin - Meal Coverage: If Solumedrol is continued as ordered, please consider increasing meal coverage to Novolog 10 units TID with meals if patient eats at least 50% of meals.  Thanks,  Barnie Alderman, RN, MSN, CDE Diabetes Coordinator Inpatient Diabetes Program [phone] (Team Pager from 8am to 5pm)

## 2018-08-31 NOTE — Progress Notes (Signed)
Pt is using the bathroom

## 2018-08-31 NOTE — Progress Notes (Signed)
Pt still eating

## 2018-08-31 NOTE — Progress Notes (Addendum)
Pharmacy Antibiotic Note  Ryan [name] is a 58 y.o. male admitted on 08/26/2018 with   pneumonia.  Pharmacy has been consulted for vancomycin and cefepime  dosing. Patient improving, afebrile.  Plan: Continue cefepime 2g IV q12h Monitor renal function, vancomycin troughs as clinically appropriate, cultures and patient progress. F/U LOT  Height: 5\' 8"  (172.7 cm) Weight: 173 lb 15.1 oz (78.9 kg) IBW/kg (Calculated) : 68.4  Temp (24hrs), Avg:98.6 F (37 C), Min:97.8 F (36.6 C), Max:99.4 F (37.4 C)  Recent Labs  Lab 08/26/18 1921 08/26/18 1955 08/27/18 0531 08/28/18 1009 08/29/18 0457 08/30/18 0419  WBC 5.8  --  3.2* 4.4 3.7* 5.5  CREATININE 1.03  --  0.93 1.09 0.92 0.95  LATICACIDVEN  --  1.7  --   --   --   --     Estimated Creatinine Clearance: 81 mL/min (by C-G formula based on SCr of 0.95 mg/dL).    Allergies  Allergen Reactions  . Albuterol Palpitations  . 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 Influenza Vaccine Live Swelling    Antimicrobials this admission: Zosyn 2/13>> 2/13 vancomycin 2/13 >>  2/16 cefepime 2/13>> Tamiflu 2/14 >>2/19   Microbiology results: 2/13 blood cx: ng x4 days 2/13 MRSA PCR:  negative 2/13 Influenza PCR: influenza A positive 2/13 Strep Pneumo Uag: negative 2/13 Legionella: pending  Thank you for allowing pharmacy to be a part of this patient's care.  Cristy Friedlander 08/31/2018 4:13 PM

## 2018-08-31 NOTE — Care Management Note (Signed)
Case Management Note  Patient Details  Name: Ryan [name] MRN: [id number] Date of Birth: 28-Mar-1961   If discussed at Long Length of Stay Meetings, dates discussed:  08/31/18  Additional Comments:  Emauri Krygier, Chauncey Reading, RN 08/31/2018, 2:00 PM

## 2018-08-31 NOTE — Progress Notes (Signed)
Physical Therapy Treatment Patient Details Name: Ryan [name] MRN: [id number] DOB: [birth year] Today's Date: 08/31/2018    History of Present Illness Ryan [name] is a 58 y.o. male with medical history significant for severe COPD with chronic hypoxic respiratory failure, history of paroxysmal atrial fibrillation not on anticoagulation, insulin-dependent diabetes mellitus, and chronic pain, now presenting to the emergency department for evaluation of increase in his chronic shortness of breath and wheezing, increased productive cough.  Patient reports that his son and wife had been ill recently with respiratory symptoms, patient himself then developed increase in his chronic cough, shortness of breath, and wheezing several days ago.  He has progressively worsened.  He denies any chest pain.  He reports some mild chronic bilateral lower extremity swelling that is unchanged.  He has had a sore throat, but not much rhinorrhea.  He is not sure of his family members were tested for influenza or not.  He called EMS, was found to be saturating in the 80s on his usual 6 L/min supplemental oxygen, treated with Xopenex and 125 mg of IV Solu-Medrol, placed on CPAP, and transported to the ED.    PT Comments    Patient presents supine in bed and agreeable to therapy. Pt with improved standing balance with functional mobility, able to ambulate with min guard assist, no loss of balance, and moderate shortness of breath noted. Pt tolerates bil LE strengthening exercises while seated EOB with moderate shortness of breath and requires therapeutic rest breaks for recovery. Pt on 6 LPM O2 and O2 sat 94-100% throughout treatment session with HR 100-120 during seated exercises. Pt reports his family can't find his RW so he needs a new one. Pt continues to be limited secondary to fatigue and generalized weakness. Pt left supine in bed, call bell in hand, and dietary in room. Patient will benefit from continued physical therapy  in hospital and recommended venue below to increase strength, balance, endurance for safe ADLs and gait.    Follow Up Recommendations  Home health PT;Supervision - Intermittent     Equipment Recommendations  Rolling walker with 5" wheels(Pt reports no longer has RW)    Recommendations for Other Services       Precautions / Restrictions Precautions Precautions: Fall Restrictions Weight Bearing Restrictions: No    Mobility  Bed Mobility Overal bed mobility: Needs Assistance Bed Mobility: Supine to Sit;Sit to Supine     Supine to sit: Min guard Sit to supine: Min guard   General bed mobility comments: increased time, use of elevated HOB  Transfers Overall transfer level: Needs assistance Equipment used: Rolling walker (2 wheeled) Transfers: Sit to/from [facility] Sit to Stand: Min guard Stand pivot transfers: Min guard       General transfer comment: increased time, initial unsteadiness upon standing resolves with time  Ambulation/Gait Ambulation/Gait assistance: Min guard Gait Distance (Feet): 10 Feet Assistive device: Rolling walker (2 wheeled) Gait Pattern/deviations: Decreased step length - right;Decreased step length - left;Decreased stride length Gait velocity: decreased   General Gait Details: Pt with decreased heel-toe pattern, min assist for RW management to steer safely due to low blindness, no loss of balance   Stairs             Wheelchair Mobility    Modified Rankin (Stroke Patients Only)       Balance Overall balance assessment: Needs assistance Sitting-balance support: Feet supported;No upper extremity supported Sitting balance-Leahy Scale: Good Sitting balance - Comments: seated edge of bed  Standing balance support: During functional activity;Bilateral upper extremity supported Standing balance-Leahy Scale: Fair Standing balance comment: with RW                            Cognition  Arousal/Alertness: Awake/alert Behavior During Therapy: WFL for tasks assessed/performed Overall Cognitive Status: Within Functional Limits for tasks assessed                                        Exercises General Exercises - Lower Extremity Long Arc Quad: Seated;AROM;Strengthening;Both;10 reps Hip Flexion/Marching: Seated;AROM;Strengthening;Both;10 reps(rest break half way through) Toe Raises: Seated;AROM;Strengthening;Both;10 reps Heel Raises: Seated;AROM;Strengthening;Both;10 reps    General Comments        Pertinent Vitals/Pain Pain Assessment: No/denies pain    Home Living                      Prior Function            PT Goals (current goals can now be found in the care plan section) Acute Rehab PT Goals Patient Stated Goal: return home PT Goal Formulation: With patient/family Time For Goal Achievement: 09/04/18 Potential to Achieve Goals: Good Progress towards PT goals: Progressing toward goals    Frequency    Min 2X/week      PT Plan Current plan remains appropriate    Co-evaluation              AM-PAC PT "6 Clicks" Mobility   Outcome Measure  Help needed turning from your back to your side while in a flat bed without using bedrails?: A Little Help needed moving from lying on your back to sitting on the side of a flat bed without using bedrails?: A Little Help needed moving to and from a bed to a chair (including a wheelchair)?: A Little Help needed standing up from a chair using your arms (e.g., wheelchair or bedside chair)?: A Little Help needed to walk in hospital room?: A Little Help needed climbing 3-5 steps with a railing? : A Lot 6 Click Score: 17    End of Session Equipment Utilized During Treatment: Gait belt;Oxygen(6 LPM) Activity Tolerance: Patient tolerated treatment well;Patient limited by fatigue Patient left: in bed;with call bell/phone within reach Nurse Communication: Mobility status PT Visit  Diagnosis: Unsteadiness on feet (R26.81);Other abnormalities of gait and mobility (R26.89);Muscle weakness (generalized) (M62.81)     Time: 0347-4259 PT Time Calculation (min) (ACUTE ONLY): 26 min  Charges:  $Gait Training: 8-22 mins $Therapeutic Exercise: 8-22 mins                     2:57 PM, 08/31/18 Talbot Grumbling, DPT Physical Therapist with [facility] [phone] office

## 2018-08-31 NOTE — Care Management Note (Signed)
Case Management Note  Patient Details  Name: Ryan [name] MRN: [id number] Date of Birth: [birth year]  Subjective/Objective:   HCAP. From home with wife. Mostly Independent. Wears oxygen at home, baseline 6 liters. Has neb machine. High readmission score, Active with [facility].  Recommended for home health PT. Agreeable. Has had [facility] in the past, elects to use them again.                 Action/Plan: DC home. Vaughan Basta of [facility] notified and will obtain orders via Epic.   Expected Discharge Date:    09/02/18              Expected Discharge Plan:  [place]  In-House Referral:     Discharge planning Services  CM Consult  Post Acute Care Choice:  Home Health Choice offered to:  Patient  DME Arranged:    DME Agency:     HH Arranged:  PT [place]:  [place]  Status of Service:  Completed, signed off  If discussed at [place] of Stay Meetings, dates discussed:    Additional Comments:  Talor Desrosiers, Chauncey Reading, RN 08/31/2018, 9:26 AM

## 2018-09-01 LAB — CBC
HCT: 38.7 % — ABNORMAL LOW (ref 39.0–52.0)
Hemoglobin: 12.2 g/dL — ABNORMAL LOW (ref 13.0–17.0)
MCH: 26.9 pg (ref 26.0–34.0)
MCHC: 31.5 g/dL (ref 30.0–36.0)
MCV: 85.4 fL (ref 80.0–100.0)
Platelets: 300 10*3/uL (ref 150–400)
RBC: 4.53 MIL/uL (ref 4.22–5.81)
RDW: 13.3 % (ref 11.5–15.5)
WBC: 9.8 10*3/uL (ref 4.0–10.5)
nRBC: 0 % (ref 0.0–0.2)

## 2018-09-01 LAB — BASIC METABOLIC PANEL
Anion gap: 10 (ref 5–15)
BUN: 27 mg/dL — ABNORMAL HIGH (ref 6–20)
CO2: 25 mmol/L (ref 22–32)
Calcium: 8.4 mg/dL — ABNORMAL LOW (ref 8.9–10.3)
Chloride: 99 mmol/L (ref 98–111)
Creatinine, Ser: 0.83 mg/dL (ref 0.61–1.24)
GFR calc Af Amer: >60 mL/min (ref >60)
GFR calc non Af Amer: >60 mL/min (ref >60)
Glucose, Bld: 274 mg/dL — ABNORMAL HIGH (ref 70–99)
Potassium: 4.4 mmol/L (ref 3.5–5.1)
Sodium: 134 mmol/L — ABNORMAL LOW (ref 135–145)

## 2018-09-01 LAB — GLUCOSE, CAPILLARY
GLUCOSE-CAPILLARY: 412 mg/dL — AB (ref 70–99)
Glucose-Capillary: 306 mg/dL — ABNORMAL HIGH (ref 70–99)
Glucose-Capillary: 202 mg/dL — ABNORMAL HIGH (ref 70–99)
Glucose-Capillary: 438 mg/dL — ABNORMAL HIGH (ref 70–99)

## 2018-09-01 MED ORDER — INSULIN ASPART 100 UNIT/ML ~~LOC~~ SOLN
12.0000 [IU] | Freq: Once | SUBCUTANEOUS | Status: AC
  Administered 2018-09-01: 12 [IU] via SUBCUTANEOUS

## 2018-09-01 MED ORDER — SODIUM CHLORIDE 0.9 % IV SOLN
2.0000 g | Freq: Two times a day (BID) | INTRAVENOUS | Status: AC
  Administered 2018-09-02: 2 g via INTRAVENOUS
  Filled 2018-09-01: qty 2

## 2018-09-01 MED ORDER — INSULIN ASPART 100 UNIT/ML ~~LOC~~ SOLN
8.0000 [IU] | Freq: Three times a day (TID) | SUBCUTANEOUS | Status: DC
  Administered 2018-09-01 – 2018-09-10 (×29): 8 [IU] via SUBCUTANEOUS

## 2018-09-01 MED ORDER — METHYLPREDNISOLONE SODIUM SUCC 40 MG IJ SOLR
40.0000 mg | Freq: Two times a day (BID) | INTRAMUSCULAR | Status: DC
  Administered 2018-09-01 – 2018-09-07 (×12): 40 mg via INTRAVENOUS
  Filled 2018-09-01 (×12): qty 1

## 2018-09-01 NOTE — Progress Notes (Signed)
Subjective: He says he feels better.  He has no new complaints.  He is still coughing but it is mostly nonproductive.  He is weak but he is able to get around in the room.  He has now finished treatment for the flu  Objective: Vital signs in last 24 hours: Temp:  [97.7 F (36.5 C)-98.7 F (37.1 C)] 98.2 F (36.8 C) (02/19 0538) Pulse Rate:  [54-96] 54 (02/19 0700) Resp:  [14-19] 14 (02/19 0538) BP: (91-123)/(69-83) 121/71 (02/19 0700) SpO2:  [92 %-100 %] 96 % (02/19 0700) Weight:  [79.2 kg] 79.2 kg (02/19 0500) Weight change: 0.3 kg Last BM Date: 08/29/18  Intake/Output from previous day: 02/18 0701 - 02/19 0700 In: 1196.8 [P.O.:530; I.V.:490.1; IV Piggyback:176.7] Out: 1725 [Urine:1725]  PHYSICAL EXAM General appearance: alert, cooperative and mild distress Resp: rhonchi bilaterally Cardio: regular rate and rhythm, S1, S2 normal, no murmur, click, rub or gallop GI: soft, non-tender; bowel sounds normal; no masses,  no organomegaly Extremities: extremities normal, atraumatic, no cyanosis or edema  Lab Results:  Results for orders placed or performed during the hospital encounter of 08/26/18 (from the past 48 hour(s))  Glucose, capillary     Status: Abnormal   Collection Time: 08/30/18 11:06 AM  Result Value Ref Range   Glucose-Capillary 403 (H) 70 - 99 mg/dL  Glucose, capillary     Status: Abnormal   Collection Time: 08/30/18  4:22 PM  Result Value Ref Range   Glucose-Capillary 204 (H) 70 - 99 mg/dL  Glucose, capillary     Status: Abnormal   Collection Time: 08/30/18  9:21 PM  Result Value Ref Range   Glucose-Capillary 252 (H) 70 - 99 mg/dL  Glucose, capillary     Status: Abnormal   Collection Time: 08/31/18  7:54 AM  Result Value Ref Range   Glucose-Capillary 333 (H) 70 - 99 mg/dL  Glucose, capillary     Status: Abnormal   Collection Time: 08/31/18 11:37 AM  Result Value Ref Range   Glucose-Capillary 471 (H) 70 - 99 mg/dL  Glucose, capillary     Status: Abnormal    Collection Time: 08/31/18  4:17 PM  Result Value Ref Range   Glucose-Capillary 203 (H) 70 - 99 mg/dL   Comment 1 Notify RN    Comment 2 Document in Chart   Glucose, capillary     Status: Abnormal   Collection Time: 08/31/18  9:44 PM  Result Value Ref Range   Glucose-Capillary 399 (H) 70 - 99 mg/dL   Comment 1 Notify RN    Comment 2 Document in Chart   CBC     Status: Abnormal   Collection Time: 09/01/18  4:14 AM  Result Value Ref Range   WBC 9.8 4.0 - 10.5 K/uL   RBC 4.53 4.22 - 5.81 MIL/uL   Hemoglobin 12.2 (L) 13.0 - 17.0 g/dL   HCT 38.7 (L) 39.0 - 52.0 %   MCV 85.4 80.0 - 100.0 fL   MCH 26.9 26.0 - 34.0 pg   MCHC 31.5 30.0 - 36.0 g/dL   RDW 13.3 11.5 - 15.5 %   Platelets 300 150 - 400 K/uL   nRBC 0.0 0.0 - 0.2 %    Comment: Performed at [facility], [address] [place], [place] [address]  Basic metabolic panel     Status: Abnormal   Collection Time: 09/01/18  4:14 AM  Result Value Ref Range   Sodium 134 (L) 135 - 145 mmol/L   Potassium 4.4 3.5 - 5.1  mmol/L   Chloride 99 98 - 111 mmol/L   CO2 25 22 - 32 mmol/L   Glucose, Bld 274 (H) 70 - 99 mg/dL   BUN 27 (H) 6 - 20 mg/dL   Creatinine, Ser 0.83 0.61 - 1.24 mg/dL   Calcium 8.4 (L) 8.9 - 10.3 mg/dL   GFR calc non Af Amer >60 >60 mL/min   GFR calc Af Amer >60 >60 mL/min   Anion gap 10 5 - 15    Comment: Performed at [facility], [address] [place], [place] [address]  Glucose, capillary     Status: Abnormal   Collection Time: 09/01/18  7:36 AM  Result Value Ref Range   Glucose-Capillary 306 (H) 70 - 99 mg/dL    ABGS No results for input(s): PHART, PO2ART, TCO2, HCO3 in the last 72 hours.  Invalid input(s): PCO2 CULTURES Recent Results (from the past 240 hour(s))  Culture, blood (Routine X 2) w Reflex to ID Panel     Status: None   Collection Time: 08/26/18  8:34 PM  Result Value Ref Range Status   Specimen Description BLOOD RIGHT ANTECUBITAL  Final   Special Requests   Final    BOTTLES DRAWN AEROBIC  AND ANAEROBIC Blood Culture adequate volume   Culture   Final    NO GROWTH 5 DAYS Performed at [facility], [address] [place], [place] [address]    Report Status 08/31/2018 FINAL  Final  Culture, blood (Routine X 2) w Reflex to ID Panel     Status: None   Collection Time: 08/26/18  8:38 PM  Result Value Ref Range Status   Specimen Description BLOOD RIGHT HAND  Final   Special Requests   Final    BOTTLES DRAWN AEROBIC AND ANAEROBIC Blood Culture adequate volume   Culture   Final    NO GROWTH 5 DAYS Performed at [facility], [address], [place], [place] [address]    Report Status 08/31/2018 FINAL  Final  MRSA PCR Screening     Status: None   Collection Time: 08/27/18  4:00 PM  Result Value Ref Range Status   MRSA by PCR NEGATIVE NEGATIVE Final    Comment:        The GeneXpert MRSA Assay (FDA approved for NASAL specimens only), is one component of a comprehensive MRSA colonization surveillance program. It is not intended to diagnose MRSA infection nor to guide or monitor treatment for MRSA infections. Performed at [facility], [address] [place], [place] [address]    Studies/Results: No results found.  Medications:  Prior to Admission:  Medications Prior to Admission  Medication Sig Dispense Refill Last Dose  . albuterol (VENTOLIN HFA) 108 (90 BASE) MCG/ACT inhaler Inhale 2 puffs into the lungs every 6 (six) hours as needed for wheezing or shortness of breath.   08/26/2018  . azithromycin (ZITHROMAX) 500 MG tablet Take 500 mg by mouth daily.   08/26/2018  . baclofen (LIORESAL) 20 MG tablet Take 20 mg by mouth 3 (three) times daily.    08/26/2018  . benzonatate (TESSALON) 200 MG capsule Take 200 mg by mouth 3 (three) times daily.   08/26/2018  . budesonide (PULMICORT) 0.25 MG/2ML nebulizer solution Take 2 mLs (0.25 mg total) by nebulization 2 (two) times daily. 60 mL 12 08/26/2018  . CARTIA XT 120 MG 24 hr capsule Take 1 capsule (120 mg total) by mouth daily. 7  capsule 0 08/26/2018  . diphenhydrAMINE (BENADRYL) 25 mg capsule Take 25 mg  by mouth every 8 (eight) hours as needed. Felt mouth swelling at ED visit 08/16/18.   Past Week at Unknown time  . fexofenadine (ALLEGRA) 180 MG tablet Take 180 mg by mouth daily.   Past Week at Unknown time  . fluticasone (FLONASE) 50 MCG/ACT nasal spray Place 2 sprays into both nostrils daily as needed.   5 08/26/2018  . Fluticasone-Umeclidin-Vilant (TRELEGY ELLIPTA) 100-62.5-25 MCG/INH AEPB Inhale 1 puff into the lungs daily.   08/26/2018  . HYDROcodone-homatropine (HYCODAN) 5-1.5 MG/5ML syrup Take 5 mLs by mouth every 6 (six) hours as needed for cough.   08/26/2018  . insulin glargine (LANTUS) 100 UNIT/ML injection Inject 0.24 mLs (24 Units total) into the skin daily. 24 units daily (Patient taking differently: Inject 32 Units into the skin daily. ) 10 mL 11 08/26/2018  . insulin lispro (HUMALOG) 100 UNIT/ML injection Take by sliding scale provided by hospital maximum amount daily is 60 units.  Check blood sugar 4 times a day (Patient taking differently: Inject 0-15 Units into the skin 3 (three) times daily with meals. Take by sliding scale provided by hospital maximum amount daily is 60 units.  Check blood sugar 4 times a day) 10 mL 11 08/26/2018  . ipratropium (ATROVENT) 0.02 % nebulizer solution Take 0.5 mg by nebulization 2 (two) times daily.   5 08/26/2018  . levalbuterol (XOPENEX) 0.63 MG/3ML nebulizer solution Take 0.63 mg by nebulization 2 (two) times daily.   08/26/2018  . levofloxacin (LEVAQUIN) 500 MG tablet Take 500 mg by mouth daily.   08/26/2018  . LORazepam (ATIVAN) 0.5 MG tablet Take 1 tablet (0.5 mg total) by mouth 2 (two) times daily as needed for anxiety or sleep. (Patient taking differently: Take 0.5 mg by mouth 4 (four) times daily. Pt takes it three times daily.) 20 tablet 0 08/26/2018  . metFORMIN (GLUCOPHAGE) 500 MG tablet Take 1 tablet (500 mg total) by mouth daily with breakfast. 30 tablet 3 08/26/2018  .  nitroGLYCERIN (NITROSTAT) 0.4 MG SL tablet Place 1 tablet (0.4 mg total) under the tongue every 5 (five) minutes as needed for chest pain. 25 tablet 4 08/26/2018  . omeprazole (PRILOSEC) 20 MG capsule Take 1 capsule (20 mg total) by mouth 2 (two) times daily before a meal. 180 capsule 3 08/26/2018  . oxyCODONE-acetaminophen (PERCOCET) 10-325 MG tablet Take 1 tablet by mouth every 4 (four) hours as needed for pain.   08/26/2018  . predniSONE (DELTASONE) 10 MG tablet Take 2 daily for 1 week then 1 daily (Patient taking differently: Take 20 mg by mouth daily with breakfast. Takes 2 tablets daily.) 60 tablet 5 08/26/2018  . rifampin (RIFADIN) 300 MG capsule Take 2 capsules (600 mg total) by mouth daily. 60 capsule 11 08/26/2018  . rosuvastatin (CRESTOR) 10 MG tablet Take 10 mg by mouth every morning.   11 08/26/2018  . predniSONE (DELTASONE) 10 MG tablet Take 2 tablets (20 mg total) by mouth daily. 6 tablet 0 Taking   Scheduled: . baclofen  20 mg Oral TID  . benzonatate  200 mg Oral TID  . chlorhexidine  15 mL Mouth Rinse BID  . diltiazem  120 mg Oral Daily  . enoxaparin (LOVENOX) injection  40 mg Subcutaneous Q24H  . fluticasone  2 spray Each Nare Daily  . fluticasone furoate-vilanterol  1 puff Inhalation Daily   And  . umeclidinium bromide  1 puff Inhalation Daily  . guaiFENesin  1,200 mg Oral BID  . insulin aspart  0-15 Units Subcutaneous TID  WC  . insulin aspart  0-5 Units Subcutaneous QHS  . insulin aspart  8 Units Subcutaneous TID WC  . insulin glargine  32 Units Subcutaneous Daily  . ipratropium  0.5 mg Nebulization Q6H  . levalbuterol  0.63 mg Nebulization Q6H  . mouth rinse  15 mL Mouth Rinse q12n4p  . methylPREDNISolone (SOLU-MEDROL) injection  40 mg Intravenous Q12H  . oxymetazoline  1 spray Each Nare BID  . pantoprazole  40 mg Oral Daily  . rosuvastatin  10 mg Oral q1800   Continuous: . sodium chloride 90 mL/hr at 08/26/18 2220  . ceFEPime (MAXIPIME) IV 2 g (09/01/18 0110)    KTC:CEQFDV chloride, alum & mag hydroxide-simeth, diphenhydrAMINE, HYDROmorphone, LORazepam, oxyCODONE-acetaminophen **AND** oxyCODONE, sodium chloride  Assesment: He was admitted with healthcare associated pneumonia and he is on antibiotics and improving.  He had influenza A and has finished treatment for that.  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  He had visual problems associated with ethambutol and he is now off that and he is essentially blind  He has chronic diastolic heart failure and frequently gets problems with exacerbation when he is in the hospital but that has not happened this admission thus far  He has chronic pain on pain medication which is been long-term  He has diabetes and I have adjusted his insulin but I am also cutting back on his Solu-Medrol so hopefully control will improve Principal Problem:   HCAP (healthcare-associated pneumonia) Active Problems:   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  quit smoking 05/01/15    Type 2 diabetes mellitus without complication (HCC)   COPD exacerbation (HCC)   Pulmonary fibrosis ([place])   Acute on chronic respiratory failure with hypoxia ([place])    Plan: Adjust his insulin.  Continue other treatments.  He still okay to transfer to the floor.    LOS: 6 days   Alonza Bogus 09/01/2018, 8:01 AM

## 2018-09-01 NOTE — Progress Notes (Signed)
Physical Therapy Treatment Patient Details Name: Ryan [name] MRN: [id number] DOB: 08/11/1960 Today's Date: 09/01/2018    History of Present Illness Ryan [name] is a 58 y.o. male with medical history significant for severe COPD with chronic hypoxic respiratory failure, history of paroxysmal atrial fibrillation not on anticoagulation, insulin-dependent diabetes mellitus, and chronic pain, now presenting to the emergency department for evaluation of increase in his chronic shortness of breath and wheezing, increased productive cough.  Patient reports that his son and wife had been ill recently with respiratory symptoms, patient himself then developed increase in his chronic cough, shortness of breath, and wheezing several days ago.  He has progressively worsened.  He denies any chest pain.  He reports some mild chronic bilateral lower extremity swelling that is unchanged.  He has had a sore throat, but not much rhinorrhea.  He is not sure of his family members were tested for influenza or not.  He called EMS, was found to be saturating in the 80s on his usual 6 L/min supplemental oxygen, treated with Xopenex and 125 mg of IV Solu-Medrol, placed on CPAP, and transported to the ED.    PT Comments    Patient states he normally uses 6 LPM O2 at home, demonstrates increased endurance/distance for gait training, able to ambulate up to 10-20 feet with only Min guard assist mostly due to blindness, once fatigued had to lean on nearby objects for support with one episode of near loss of balance due to slipping off counter top, on 6 LPM with O2 saturation between 92-95%, but once fatigued required use of RW to make it back to room with O2 saturation dropping to 88% and O2 increased to 10 LPM to bring saturation above 92%, later put back on 7 LPM once sitting at bedside - RN notified.  Patient will benefit from continued physical therapy in hospital and recommended venue below to increase strength, balance,  endurance for safe ADLs and gait.   Follow Up Recommendations  Home health PT;Supervision - Intermittent     Equipment Recommendations  Rolling walker with 5" wheels    Recommendations for Other Services       Precautions / Restrictions Precautions Precautions: Fall Restrictions Weight Bearing Restrictions: No    Mobility  Bed Mobility Overal bed mobility: Modified Independent             General bed mobility comments: head of bed slightly elevated  Transfers Overall transfer level: Needs assistance Equipment used: None;1 person hand held assist;Rolling walker (2 wheeled) Transfers: Sit to/from [facility] Sit to Stand: Supervision Stand pivot transfers: Supervision       General transfer comment: good return for sit to stands without using AD, but has to lean on nearby objects for support mostly due to blindness  Ambulation/Gait Ambulation/Gait assistance: Min assist;Min guard Gait Distance (Feet): 40 Feet Assistive device: Rolling walker (2 wheeled);None Gait Pattern/deviations: Decreased step length - right;Decreased step length - left;Decreased stance time - left Gait velocity: decreased   General Gait Details: slow labored cadence with frequent leaning on nearby objects for support, had near loss of balance due to slipping off counter top, became SOB and required RW to make it back to room, on 6-10 LPM   Stairs             Wheelchair Mobility    Modified Rankin (Stroke Patients Only)       Balance Overall balance assessment: Needs assistance Sitting-balance support: Feet supported;No upper extremity supported Sitting  balance-Leahy Scale: Good     Standing balance support: No upper extremity supported;During functional activity Standing balance-Leahy Scale: Fair Standing balance comment: fair/good with RW                            Cognition Arousal/Alertness: Awake/alert Behavior During Therapy: WFL for  tasks assessed/performed Overall Cognitive Status: Within Functional Limits for tasks assessed                                        Exercises General Exercises - Lower Extremity Long Arc Quad: Seated;AROM;Strengthening;Both;10 reps Hip Flexion/Marching: Seated;AROM;Strengthening;Both;10 reps Toe Raises: Seated;AROM;Strengthening;Both;10 reps Heel Raises: Seated;AROM;Strengthening;Both;10 reps    General Comments        Pertinent Vitals/Pain Pain Assessment: No/denies pain    Home Living                      Prior Function            PT Goals (current goals can now be found in the care plan section) Acute Rehab PT Goals Patient Stated Goal: return home Time For Goal Achievement: 09/04/18 Potential to Achieve Goals: Good Progress towards PT goals: Progressing toward goals    Frequency    Min 3X/week      PT Plan Current plan remains appropriate    Co-evaluation              AM-PAC PT "6 Clicks" Mobility   Outcome Measure  Help needed turning from your back to your side while in a flat bed without using bedrails?: None Help needed moving from lying on your back to sitting on the side of a flat bed without using bedrails?: None Help needed moving to and from a bed to a chair (including a wheelchair)?: A Little Help needed standing up from a chair using your arms (e.g., wheelchair or bedside chair)?: A Little Help needed to walk in hospital room?: A Little Help needed climbing 3-5 steps with a railing? : A Lot 6 Click Score: 19    End of Session Equipment Utilized During Treatment: Oxygen Activity Tolerance: Patient tolerated treatment well;Patient limited by fatigue Patient left: in bed;with call bell/phone within reach(seated at bedside) Nurse Communication: Mobility status PT Visit Diagnosis: Unsteadiness on feet (R26.81);Other abnormalities of gait and mobility (R26.89);Muscle weakness (generalized) (M62.81)     Time:  8088-1103 PT Time Calculation (min) (ACUTE ONLY): 27 min  Charges:  $Gait Training: 8-22 mins $Therapeutic Exercise: 8-22 mins                     2:32 PM, 09/01/18 Lonell Grandchild, MPT Physical Therapist with [facility] [phone] office [phone] mobile phone

## 2018-09-01 NOTE — Progress Notes (Signed)
Notified Dr. Luan Pulling of (Time 1111) CBG result 412. Patient asymptomatic, alert and oriented with no complaints. Per Dr. Luan Pulling did not want a lab draw and stated to give the sliding scale and baseline insulin that he put in this morning.

## 2018-09-01 NOTE — Progress Notes (Signed)
Received from ICU via wheelchair to room 305. Assisted to bed and positioned for comfort. Oriented to room, bed and unit.

## 2018-09-02 ENCOUNTER — Inpatient Hospital Stay (HOSPITAL_COMMUNITY): Payer: Medicare Other

## 2018-09-02 LAB — COMPREHENSIVE METABOLIC PANEL
ALT: 52 U/L — ABNORMAL HIGH (ref 0–44)
AST: 22 U/L (ref 15–41)
Albumin: 3 g/dL — ABNORMAL LOW (ref 3.5–5.0)
Alkaline Phosphatase: 37 U/L — ABNORMAL LOW (ref 38–126)
Anion gap: 10 (ref 5–15)
BUN: 24 mg/dL — ABNORMAL HIGH (ref 6–20)
CO2: 25 mmol/L (ref 22–32)
Calcium: 8 mg/dL — ABNORMAL LOW (ref 8.9–10.3)
Chloride: 98 mmol/L (ref 98–111)
Creatinine, Ser: 0.91 mg/dL (ref 0.61–1.24)
GFR calc Af Amer: >60 mL/min (ref >60)
GFR calc non Af Amer: >60 mL/min (ref >60)
Glucose, Bld: 295 mg/dL — ABNORMAL HIGH (ref 70–99)
Potassium: 4 mmol/L (ref 3.5–5.1)
Sodium: 133 mmol/L — ABNORMAL LOW (ref 135–145)
Total Bilirubin: 0.3 mg/dL (ref 0.3–1.2)
Total Protein: 6.4 g/dL — ABNORMAL LOW (ref 6.5–8.1)

## 2018-09-02 LAB — GLUCOSE, CAPILLARY
GLUCOSE-CAPILLARY: 261 mg/dL — AB (ref 70–99)
GLUCOSE-CAPILLARY: 353 mg/dL — AB (ref 70–99)
Glucose-Capillary: 165 mg/dL — ABNORMAL HIGH (ref 70–99)
Glucose-Capillary: 357 mg/dL — ABNORMAL HIGH (ref 70–99)

## 2018-09-02 LAB — CBC WITH DIFFERENTIAL/PLATELET
Abs Immature Granulocytes: 0.34 10*3/uL — ABNORMAL HIGH (ref 0.00–0.07)
Basophils Absolute: 0 10*3/uL (ref 0.0–0.1)
Basophils Relative: 0 %
Eosinophils Absolute: 0 10*3/uL (ref 0.0–0.5)
Eosinophils Relative: 0 %
HCT: 37.1 % — ABNORMAL LOW (ref 39.0–52.0)
Hemoglobin: 11.5 g/dL — ABNORMAL LOW (ref 13.0–17.0)
Immature Granulocytes: 4 %
Lymphocytes Relative: 18 %
Lymphs Abs: 1.7 10*3/uL (ref 0.7–4.0)
MCH: 26.6 pg (ref 26.0–34.0)
MCHC: 31 g/dL (ref 30.0–36.0)
MCV: 85.9 fL (ref 80.0–100.0)
MONO ABS: 0.9 10*3/uL (ref 0.1–1.0)
Monocytes Relative: 9 %
Neutro Abs: 6.7 10*3/uL (ref 1.7–7.7)
Neutrophils Relative %: 69 %
Platelets: 289 10*3/uL (ref 150–400)
RBC: 4.32 MIL/uL (ref 4.22–5.81)
RDW: 13.5 % (ref 11.5–15.5)
WBC: 9.7 10*3/uL (ref 4.0–10.5)
nRBC: 0.2 % (ref 0.0–0.2)

## 2018-09-02 LAB — URINALYSIS, ROUTINE W REFLEX MICROSCOPIC
Bacteria, UA: NONE SEEN
Bilirubin Urine: NEGATIVE
Glucose, UA: >=500 mg/dL — AB
Ketones, ur: NEGATIVE mg/dL
Leukocytes,Ua: NEGATIVE
NITRITE: NEGATIVE
Protein, ur: 30 mg/dL — AB
Specific Gravity, Urine: 1.027 (ref 1.005–1.030)
pH: 5 (ref 5.0–8.0)

## 2018-09-02 LAB — CREATININE, SERUM
Creatinine, Ser: 0.89 mg/dL (ref 0.61–1.24)
GFR calc Af Amer: >60 mL/min (ref >60)
GFR calc non Af Amer: >60 mL/min (ref >60)

## 2018-09-02 IMAGING — MR MR HEAD WO/W CM
7 of 14 series · 27 of 48 positions shown · IV contrast (multihance)
Comparison: CT HEAD June 09, 2017.

CLINICAL DATA: Vision difficulties for 2 months. No known injury.
History of atrial fibrillation, polycythemia, hypercholesterolemia,
diabetes.

EXAM:
MRI HEAD WITHOUT AND WITH CONTRAST
TECHNIQUE: Multiplanar, multiecho pulse sequences of the brain and surrounding
structures were obtained without and with intravenous contrast.
CONTRAST:  13mL MULTIHANCE GADOBENATE DIMEGLUMINE 529 MG/ML IV SOLN

[Series 4: DWI · axial · 3.0mm · 0.82mm/px · z∈[-71,+70]mm · 4 of 50 slices shown (1 of 4)]
[im 1/50]
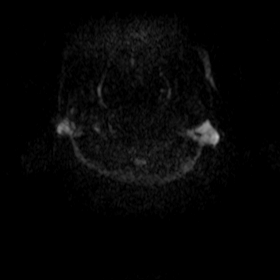
[im 17/50]
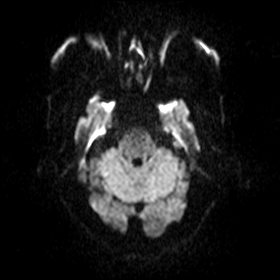
[im 33/50]
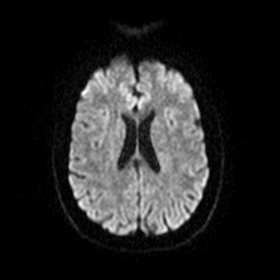
[im 50/50]
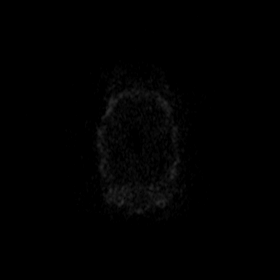

[Series 5: DWI · axial · 3.0mm · 0.82mm/px · z∈[-71,+70]mm · 4 of 50 slices shown (2 of 4)]
[im 1/50]
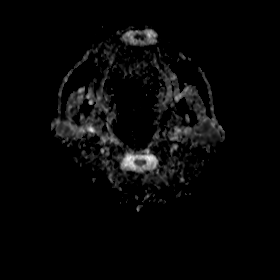
[im 17/50]
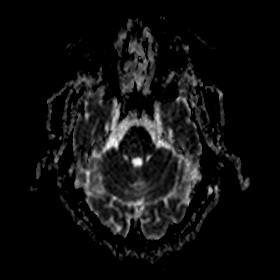
[im 33/50]
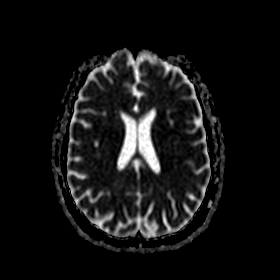
[im 50/50]
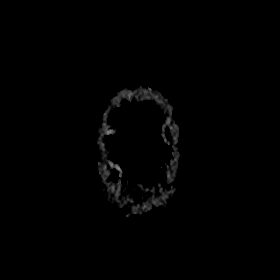

[Series 6: DWI · coronal · 5.0mm · 0.48mm/px · 3 of 30 slices shown (3 of 4)]
[im 1/30]
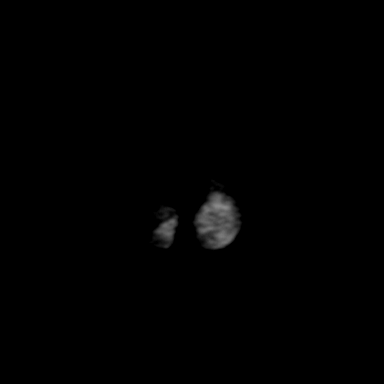
[im 15/30]
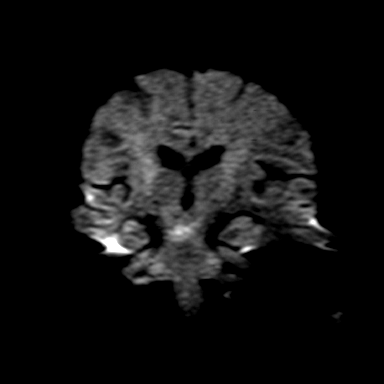
[im 30/30]
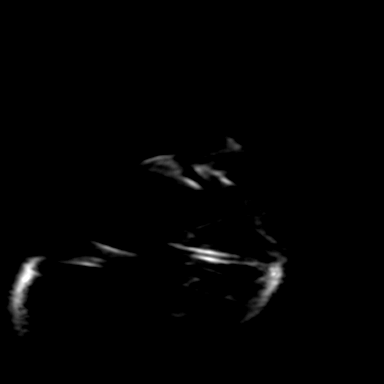

[Series 7: DWI · coronal · 5.0mm · 0.48mm/px · 3 of 30 slices shown (4 of 4)]
[im 1/30]
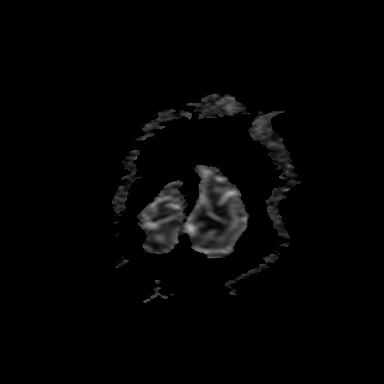
[im 15/30]
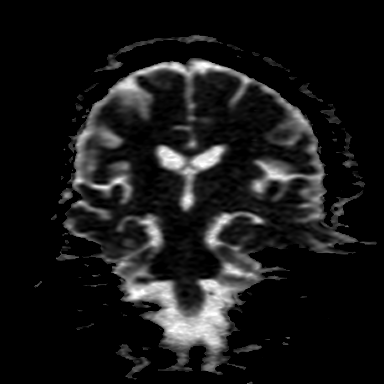
[im 30/30]
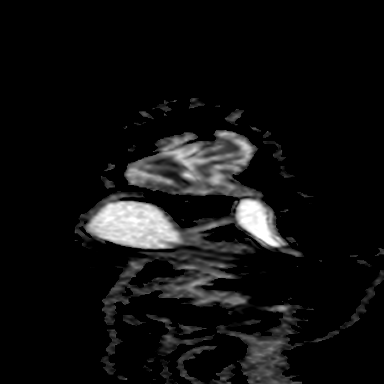

[Series 9: FLAIR · axial · 3.0mm · 0.32mm/px · z∈[-70,+63]mm · 4 of 47 slices shown]
[im 1/47]
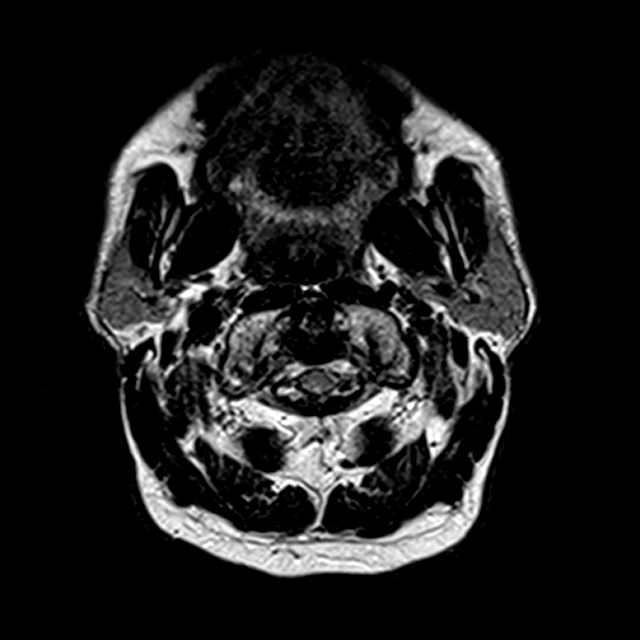
[im 16/47]
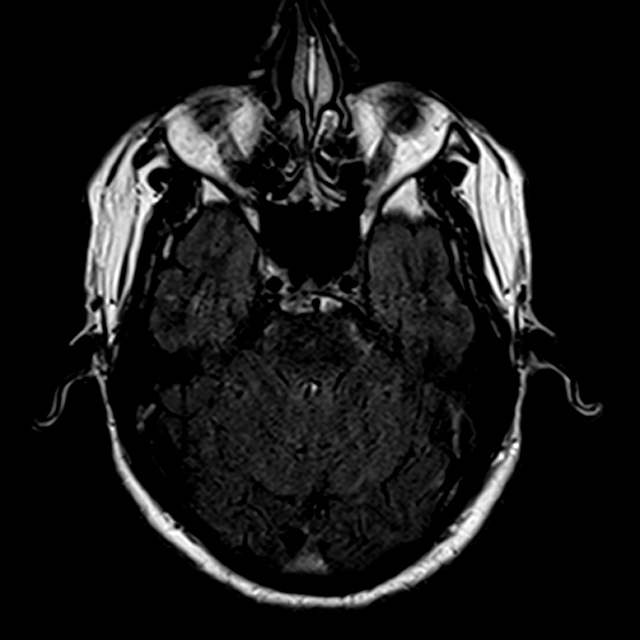
[im 31/47]
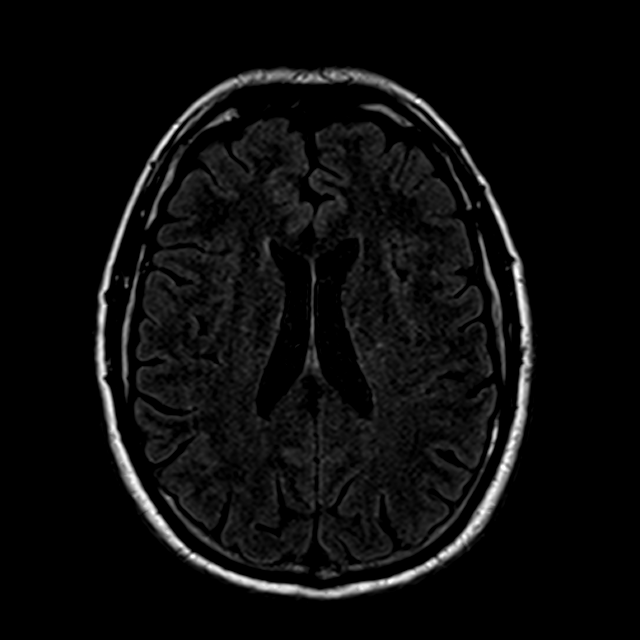
[im 47/47]
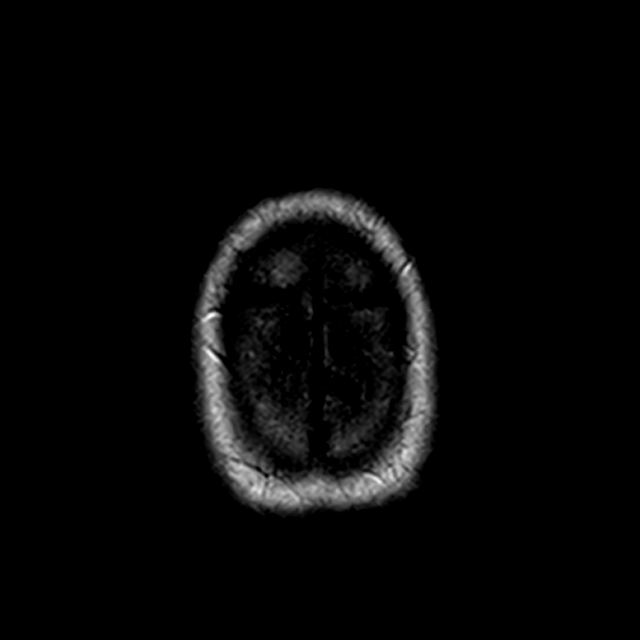

[Series 14: T1 post-contrast · axial · 2.0mm · 0.45mm/px · z∈[-81,+99]mm · 8 of 95 slices shown (1 of 2)]
[im 1/95]
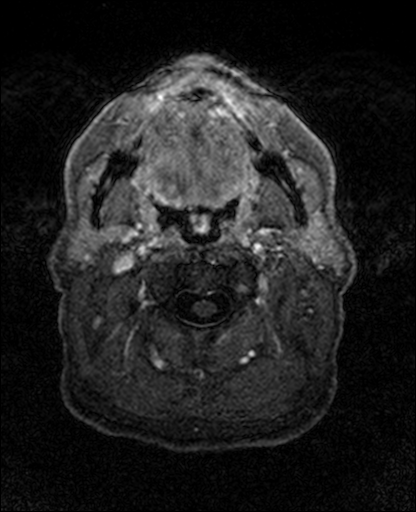
[im 14/95]
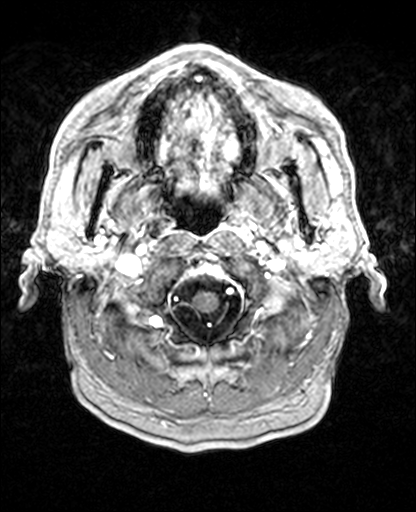
[im 27/95]
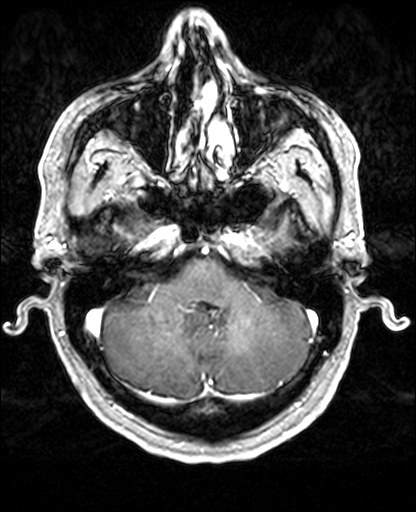
[im 41/95]
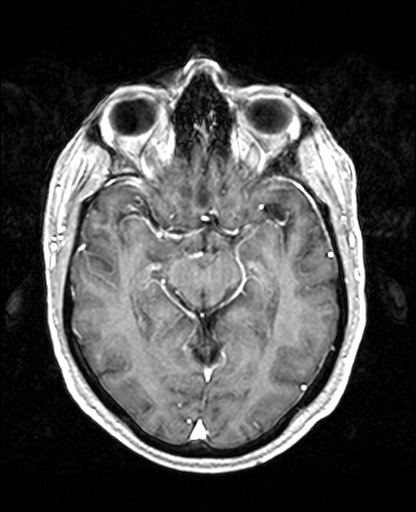
[im 54/95]
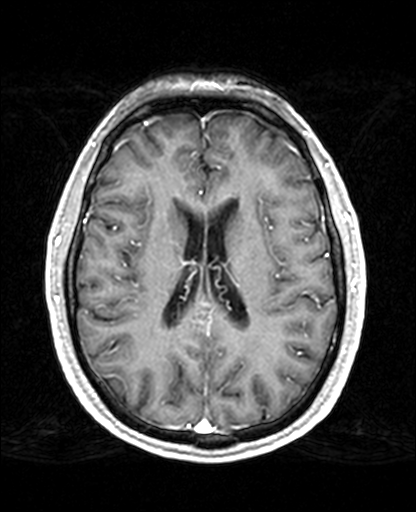
[im 68/95]
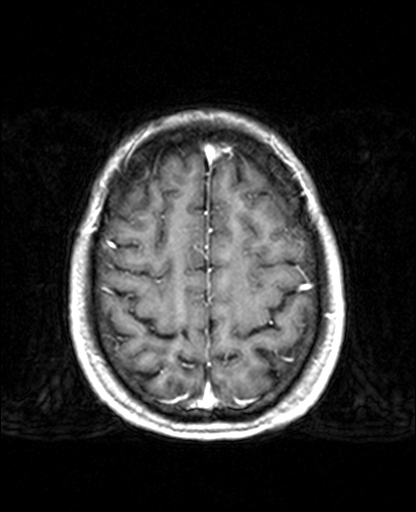
[im 81/95]
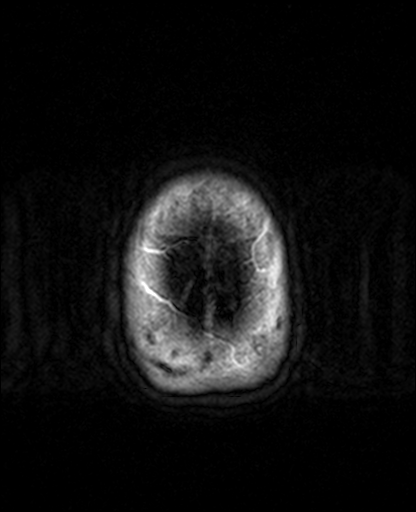
[im 95/95]
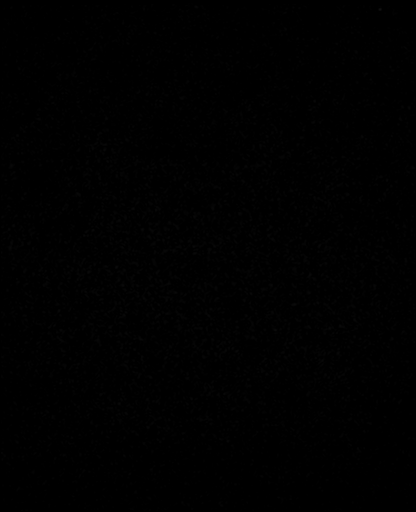

[Series 15: T1 post-contrast · coronal · 5.0mm · 0.40mm/px · 1 of 24 slices shown (2 of 2)]
[im 1/24]
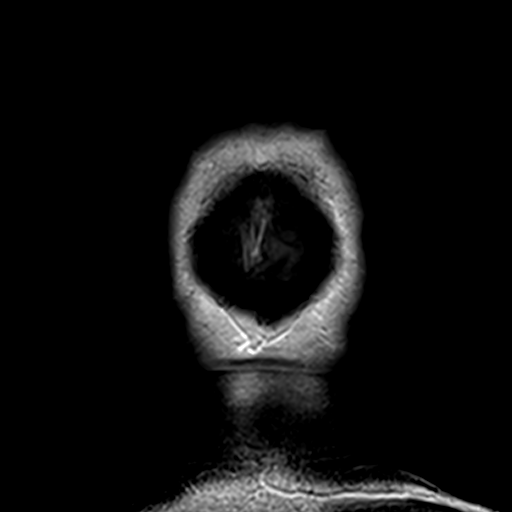

[27 of 48 positions shown; findings below may reference images not displayed]

FINDINGS: INTRACRANIAL CONTENTS: No reduced diffusion to suggest acute
ischemia. No susceptibility artifact to suggest hemorrhage. The
ventricles and sulci are normal for patient's age. No suspicious
parenchymal signal, masses, mass effect. No abnormal
intraparenchymal or extra-axial enhancement. No abnormal extra-axial
fluid collections. No extra-axial masses.

VASCULAR: Normal major intracranial vascular flow voids present at
skull base.

SKULL AND UPPER CERVICAL SPINE: No abnormal sellar expansion. No
suspicious calvarial bone marrow signal. Craniocervical junction
maintained.

SINUSES/ORBITS: Trace mastoid effusions. Mildly atretic LEFT
maxillary sinus associated with chronic sinusitis with mild mucosal
thickening. The included ocular globes and orbital contents are
non-suspicious. Status post RIGHT ocular lens implant.

OTHER: Patient is edentulous.
IMPRESSION: Normal MRI of the brain with and without contrast.

## 2018-09-02 MED ORDER — LEVALBUTEROL HCL 0.63 MG/3ML IN NEBU
0.6300 mg | INHALATION_SOLUTION | Freq: Four times a day (QID) | RESPIRATORY_TRACT | Status: DC
  Administered 2018-09-03 – 2018-09-10 (×27): 0.63 mg via RESPIRATORY_TRACT
  Filled 2018-09-02 (×30): qty 3

## 2018-09-02 MED ORDER — INSULIN GLARGINE 100 UNIT/ML ~~LOC~~ SOLN
35.0000 [IU] | Freq: Every day | SUBCUTANEOUS | Status: DC
  Administered 2018-09-02: 35 [IU] via SUBCUTANEOUS
  Filled 2018-09-02 (×6): qty 0.35

## 2018-09-02 MED ORDER — IPRATROPIUM BROMIDE 0.02 % IN SOLN
0.5000 mg | Freq: Four times a day (QID) | RESPIRATORY_TRACT | Status: DC
  Administered 2018-09-03 – 2018-09-10 (×27): 0.5 mg via RESPIRATORY_TRACT
  Filled 2018-09-02 (×30): qty 2.5

## 2018-09-02 MED ORDER — IOPAMIDOL (ISOVUE-370) INJECTION 76%
100.0000 mL | Freq: Once | INTRAVENOUS | Status: AC | PRN
  Administered 2018-09-02: 100 mL via INTRAVENOUS

## 2018-09-02 NOTE — Progress Notes (Signed)
Physical Therapy Treatment Patient Details Name: Ryan [name] MRN: [id number] DOB: [birth year] Today's Date: 09/02/2018    History of Present Illness Ryan [name] is a 58 y.o. male with medical history significant for severe COPD with chronic hypoxic respiratory failure, history of paroxysmal atrial fibrillation not on anticoagulation, insulin-dependent diabetes mellitus, and chronic pain, now presenting to the emergency department for evaluation of increase in his chronic shortness of breath and wheezing, increased productive cough.  Patient reports that his son and wife had been ill recently with respiratory symptoms, patient himself then developed increase in his chronic cough, shortness of breath, and wheezing several days ago.  He has progressively worsened.  He denies any chest pain.  He reports some mild chronic bilateral lower extremity swelling that is unchanged.  He has had a sore throat, but not much rhinorrhea.  He is not sure of his family members were tested for influenza or not.  He called EMS, was found to be saturating in the 80s on his usual 6 L/min supplemental oxygen, treated with Xopenex and 125 mg of IV Solu-Medrol, placed on CPAP, and transported to the ED.    PT Comments    Pt sitting on EOB and willing to participate with therapy today.  Utilized RW with gait training, pt able to increase distance to 200 feet with no LOB or c/o SOB.  Pt at 7L O2 at rest sitting, requested to increased to 8L when initially stood prior gait.  O2 saturation stayed between 96-100%.  Pt did required seated rest break per fatigue and requested increase to 10L O2 A for last 50 feet of gait even though O2 sat at 96%.  EOS pt sitting on EOB, call bell within reach and no reports of pain or SOB.      Follow Up Recommendations  Home health PT;Supervision - Intermittent     Equipment Recommendations  Rolling walker with 5" wheels    Recommendations for Other Services       Precautions /  Restrictions Precautions Precautions: Fall    Mobility  Bed Mobility               General bed mobility comments: pt sitting on EOB at therapist entrance  Transfers Overall transfer level: Modified independent Equipment used: Rolling walker (2 wheeled) Transfers: Sit to/from Stand Sit to Stand: Supervision         General transfer comment: Cueing for hand placement to assist with STS safely  Ambulation/Gait Ambulation/Gait assistance: Min guard Gait Distance (Feet): 200 Feet Assistive device: Rolling walker (2 wheeled) Gait Pattern/deviations: Decreased step length - right;Decreased step length - left;Decreased stance time - left Gait velocity: decreased   General Gait Details: Slow cadence and frequently checking on O2 saturation average 96-100% wiht 8L O2 A; requested to increased to 10L last 50 feet of gait even though O2 sat at 98%.  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  No c/o SOB thorugh session.     Stairs             Wheelchair Mobility    Modified Rankin (Stroke Patients Only)       Balance                                            Cognition Arousal/Alertness: Awake/alert Behavior During Therapy: WFL for  tasks assessed/performed Overall Cognitive Status: Within Functional Limits for tasks assessed                                        Exercises      General Comments        Pertinent Vitals/Pain Pain Assessment: No/denies pain    Home Living                      Prior Function            PT Goals (current goals can now be found in the care plan section)      Frequency    Min 3X/week      PT Plan Current plan remains appropriate    Co-evaluation              AM-PAC PT "6 Clicks" Mobility   Outcome Measure  Help needed turning from your back to your side while in a flat bed without using bedrails?: None Help needed  moving from lying on your back to sitting on the side of a flat bed without using bedrails?: None Help needed moving to and from a bed to a chair (including a wheelchair)?: A Little Help needed standing up from a chair using your arms (e.g., wheelchair or bedside chair)?: A Little Help needed to walk in hospital room?: A Little Help needed climbing 3-5 steps with a railing? : A Lot 6 Click Score: 19    End of Session Equipment Utilized During Treatment: Oxygen;Gait belt Activity Tolerance: Patient tolerated treatment well;Patient limited by fatigue Patient left: in bed;with call bell/phone within reach(sitting on EOB wiht call bell within reach) Nurse Communication: Mobility status       Time: 3790-2409 PT Time Calculation (min) (ACUTE ONLY): 27 min  Charges:  $Therapeutic Activity: 23-37 mins                     [address], LPTA; [place] Aldona Lento 09/02/2018, 4:37 PM

## 2018-09-02 NOTE — Progress Notes (Signed)
Subjective: He says he feels terrible this morning.  He is coughing more.  He is more short of breath.  He is incontinent of urine.  He feels weaker  Objective: Vital signs in last 24 hours: Temp:  [97.7 F (36.5 C)-98.3 F (36.8 C)] 97.7 F (36.5 C) (02/20 0512) Pulse Rate:  [72-99] 74 (02/20 0512) Resp:  [16-20] 19 (02/20 0512) BP: (119-143)/(65-88) 143/88 (02/20 0512) SpO2:  [97 %-100 %] 100 % (02/20 0808) Weight change:  Last BM Date: 08/29/18  Intake/Output from previous day: 02/19 0701 - 02/20 0700 In: 2460 [P.O.:2160; IV Piggyback:300] Out: 1500 [Urine:1500]  PHYSICAL EXAM General appearance: alert, cooperative, moderate distress and Acutely and chronically sick in appearance Resp: rhonchi bilaterally Cardio: regular rate and rhythm, S1, S2 normal, no murmur, click, rub or gallop GI: soft, non-tender; bowel sounds normal; no masses,  no organomegaly Extremities: extremities normal, atraumatic, no cyanosis or edema  Lab Results:  Results for orders placed or performed during the hospital encounter of 08/26/18 (from the past 48 hour(s))  Glucose, capillary     Status: Abnormal   Collection Time: 08/31/18 11:37 AM  Result Value Ref Range   Glucose-Capillary 471 (H) 70 - 99 mg/dL  Glucose, capillary     Status: Abnormal   Collection Time: 08/31/18  4:17 PM  Result Value Ref Range   Glucose-Capillary 203 (H) 70 - 99 mg/dL   Comment 1 Notify RN    Comment 2 Document in Chart   Glucose, capillary     Status: Abnormal   Collection Time: 08/31/18  9:44 PM  Result Value Ref Range   Glucose-Capillary 399 (H) 70 - 99 mg/dL   Comment 1 Notify RN    Comment 2 Document in Chart   CBC     Status: Abnormal   Collection Time: 09/01/18  4:14 AM  Result Value Ref Range   WBC 9.8 4.0 - 10.5 K/uL   RBC 4.53 4.22 - 5.81 MIL/uL   Hemoglobin 12.2 (L) 13.0 - 17.0 g/dL   HCT 38.7 (L) 39.0 - 52.0 %   MCV 85.4 80.0 - 100.0 fL   MCH 26.9 26.0 - 34.0 pg   MCHC 31.5 30.0 - 36.0 g/dL   RDW 13.3 11.5 - 15.5 %   Platelets 300 150 - 400 K/uL   nRBC 0.0 0.0 - 0.2 %    Comment: Performed at [facility], [address] [place], [place] [address]  Basic metabolic panel     Status: Abnormal   Collection Time: 09/01/18  4:14 AM  Result Value Ref Range   Sodium 134 (L) 135 - 145 mmol/L   Potassium 4.4 3.5 - 5.1 mmol/L   Chloride 99 98 - 111 mmol/L   CO2 25 22 - 32 mmol/L   Glucose, Bld 274 (H) 70 - 99 mg/dL   BUN 27 (H) 6 - 20 mg/dL   Creatinine, Ser 0.83 0.61 - 1.24 mg/dL   Calcium 8.4 (L) 8.9 - 10.3 mg/dL   GFR calc non Af Amer >60 >60 mL/min   GFR calc Af Amer >60 >60 mL/min   Anion gap 10 5 - 15    Comment: Performed at [facility], [address], [place], [place] [address]  Glucose, capillary     Status: Abnormal   Collection Time: 09/01/18  7:36 AM  Result Value Ref Range   Glucose-Capillary 306 (H) 70 - 99 mg/dL  Glucose, capillary     Status: Abnormal   Collection Time: 09/01/18 11:11 AM  Result Value Ref Range   Glucose-Capillary 412 (H) 70 - 99 mg/dL  Glucose, capillary     Status: Abnormal   Collection Time: 09/01/18  4:01 PM  Result Value Ref Range   Glucose-Capillary 202 (H) 70 - 99 mg/dL   Comment 1 Notify RN    Comment 2 Document in Chart   Glucose, capillary     Status: Abnormal   Collection Time: 09/01/18 10:39 PM  Result Value Ref Range   Glucose-Capillary 438 (H) 70 - 99 mg/dL   Comment 1 Notify RN    Comment 2 Document in Chart   Creatinine, serum     Status: None   Collection Time: 09/02/18  4:16 AM  Result Value Ref Range   Creatinine, Ser 0.89 0.61 - 1.24 mg/dL   GFR calc non Af Amer >60 >60 mL/min   GFR calc Af Amer >60 >60 mL/min    Comment: Performed at [facility], [address] [place], [place] [address]  Glucose, capillary     Status: Abnormal   Collection Time: 09/02/18  7:31 AM  Result Value Ref Range   Glucose-Capillary 261 (H) 70 - 99 mg/dL    ABGS No results for input(s): PHART, PO2ART, TCO2, HCO3 in the last 72  hours.  Invalid input(s): PCO2 CULTURES Recent Results (from the past 240 hour(s))  Culture, blood (Routine X 2) w Reflex to ID Panel     Status: None   Collection Time: 08/26/18  8:34 PM  Result Value Ref Range Status   Specimen Description BLOOD RIGHT ANTECUBITAL  Final   Special Requests   Final    BOTTLES DRAWN AEROBIC AND ANAEROBIC Blood Culture adequate volume   Culture   Final    NO GROWTH 5 DAYS Performed at [facility], [address], [place], [place] [address]    Report Status 08/31/2018 FINAL  Final  Culture, blood (Routine X 2) w Reflex to ID Panel     Status: None   Collection Time: 08/26/18  8:38 PM  Result Value Ref Range Status   Specimen Description BLOOD RIGHT HAND  Final   Special Requests   Final    BOTTLES DRAWN AEROBIC AND ANAEROBIC Blood Culture adequate volume   Culture   Final    NO GROWTH 5 DAYS Performed at [facility], [address] [place], [place] [address]    Report Status 08/31/2018 FINAL  Final  MRSA PCR Screening     Status: None   Collection Time: 08/27/18  4:00 PM  Result Value Ref Range Status   MRSA by PCR NEGATIVE NEGATIVE Final    Comment:        The GeneXpert MRSA Assay (FDA approved for NASAL specimens only), is one component of a comprehensive MRSA colonization surveillance program. It is not intended to diagnose MRSA infection nor to guide or monitor treatment for MRSA infections. Performed at [facility], [address], [place], [place] [address]    Studies/Results: No results found.  Medications:  Prior to Admission:  Medications Prior to Admission  Medication Sig Dispense Refill Last Dose  . albuterol (VENTOLIN HFA) 108 (90 BASE) MCG/ACT inhaler Inhale 2 puffs into the lungs every 6 (six) hours as needed for wheezing or shortness of breath.   08/26/2018  . azithromycin (ZITHROMAX) 500 MG tablet Take 500 mg by mouth daily.   08/26/2018  . baclofen (LIORESAL) 20 MG tablet Take 20 mg by mouth 3 (three) times daily.     08/26/2018  .  benzonatate (TESSALON) 200 MG capsule Take 200 mg by mouth 3 (three) times daily.   08/26/2018  . budesonide (PULMICORT) 0.25 MG/2ML nebulizer solution Take 2 mLs (0.25 mg total) by nebulization 2 (two) times daily. 60 mL 12 08/26/2018  . CARTIA XT 120 MG 24 hr capsule Take 1 capsule (120 mg total) by mouth daily. 7 capsule 0 08/26/2018  . diphenhydrAMINE (BENADRYL) 25 mg capsule Take 25 mg by mouth every 8 (eight) hours as needed. Felt mouth swelling at ED visit 08/16/18.   Past Week at Unknown time  . fexofenadine (ALLEGRA) 180 MG tablet Take 180 mg by mouth daily.   Past Week at Unknown time  . fluticasone (FLONASE) 50 MCG/ACT nasal spray Place 2 sprays into both nostrils daily as needed.   5 08/26/2018  . Fluticasone-Umeclidin-Vilant (TRELEGY ELLIPTA) 100-62.5-25 MCG/INH AEPB Inhale 1 puff into the lungs daily.   08/26/2018  . HYDROcodone-homatropine (HYCODAN) 5-1.5 MG/5ML syrup Take 5 mLs by mouth every 6 (six) hours as needed for cough.   08/26/2018  . insulin glargine (LANTUS) 100 UNIT/ML injection Inject 0.24 mLs (24 Units total) into the skin daily. 24 units daily (Patient taking differently: Inject 32 Units into the skin daily. ) 10 mL 11 08/26/2018  . insulin lispro (HUMALOG) 100 UNIT/ML injection Take by sliding scale provided by hospital maximum amount daily is 60 units.  Check blood sugar 4 times a day (Patient taking differently: Inject 0-15 Units into the skin 3 (three) times daily with meals. Take by sliding scale provided by hospital maximum amount daily is 60 units.  Check blood sugar 4 times a day) 10 mL 11 08/26/2018  . ipratropium (ATROVENT) 0.02 % nebulizer solution Take 0.5 mg by nebulization 2 (two) times daily.   5 08/26/2018  . levalbuterol (XOPENEX) 0.63 MG/3ML nebulizer solution Take 0.63 mg by nebulization 2 (two) times daily.   08/26/2018  . levofloxacin (LEVAQUIN) 500 MG tablet Take 500 mg by mouth daily.   08/26/2018  . LORazepam (ATIVAN) 0.5 MG tablet Take 1 tablet  (0.5 mg total) by mouth 2 (two) times daily as needed for anxiety or sleep. (Patient taking differently: Take 0.5 mg by mouth 4 (four) times daily. Pt takes it three times daily.) 20 tablet 0 08/26/2018  . metFORMIN (GLUCOPHAGE) 500 MG tablet Take 1 tablet (500 mg total) by mouth daily with breakfast. 30 tablet 3 08/26/2018  . nitroGLYCERIN (NITROSTAT) 0.4 MG SL tablet Place 1 tablet (0.4 mg total) under the tongue every 5 (five) minutes as needed for chest pain. 25 tablet 4 08/26/2018  . omeprazole (PRILOSEC) 20 MG capsule Take 1 capsule (20 mg total) by mouth 2 (two) times daily before a meal. 180 capsule 3 08/26/2018  . oxyCODONE-acetaminophen (PERCOCET) 10-325 MG tablet Take 1 tablet by mouth every 4 (four) hours as needed for pain.   08/26/2018  . predniSONE (DELTASONE) 10 MG tablet Take 2 daily for 1 week then 1 daily (Patient taking differently: Take 20 mg by mouth daily with breakfast. Takes 2 tablets daily.) 60 tablet 5 08/26/2018  . rifampin (RIFADIN) 300 MG capsule Take 2 capsules (600 mg total) by mouth daily. 60 capsule 11 08/26/2018  . rosuvastatin (CRESTOR) 10 MG tablet Take 10 mg by mouth every morning.   11 08/26/2018  . predniSONE (DELTASONE) 10 MG tablet Take 2 tablets (20 mg total) by mouth daily. 6 tablet 0 Taking   Scheduled: . baclofen  20 mg Oral TID  . benzonatate  200 mg Oral TID  .  chlorhexidine  15 mL Mouth Rinse BID  . diltiazem  120 mg Oral Daily  . enoxaparin (LOVENOX) injection  40 mg Subcutaneous Q24H  . fluticasone  2 spray Each Nare Daily  . fluticasone furoate-vilanterol  1 puff Inhalation Daily   And  . umeclidinium bromide  1 puff Inhalation Daily  . guaiFENesin  1,200 mg Oral BID  . insulin aspart  0-15 Units Subcutaneous TID WC  . insulin aspart  0-5 Units Subcutaneous QHS  . insulin aspart  8 Units Subcutaneous TID WC  . insulin glargine  32 Units Subcutaneous Daily  . ipratropium  0.5 mg Nebulization Q6H  . levalbuterol  0.63 mg Nebulization Q6H  . mouth  rinse  15 mL Mouth Rinse q12n4p  . methylPREDNISolone (SOLU-MEDROL) injection  40 mg Intravenous Q12H  . pantoprazole  40 mg Oral Daily  . rosuvastatin  10 mg Oral q1800   Continuous: . sodium chloride 90 mL/hr at 08/26/18 2220   OYD:XAJOIN chloride, alum & mag hydroxide-simeth, diphenhydrAMINE, HYDROmorphone, LORazepam, oxyCODONE-acetaminophen **AND** oxyCODONE, sodium chloride  Assesment: He was admitted with healthcare associated pneumonia.  He had been improving but today says he feels terrible and is more short of breath and having more problem with chest congestion.  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  He has diabetes and his blood sugar has been up but I have cut his steroids back and increased his insulin.  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  He may need more antibiotics.    LOS: 7 days   Alonza Bogus 09/02/2018, 8:45 AM

## 2018-09-02 NOTE — Progress Notes (Signed)
Patient refusing to allow staff to turn on bed alarm, reviewed the safety reasons that the bed alarm is needed, patient verbalized understanding, but still refused.

## 2018-09-03 LAB — CBC
HCT: 34.6 % — ABNORMAL LOW (ref 39.0–52.0)
Hemoglobin: 11.2 g/dL — ABNORMAL LOW (ref 13.0–17.0)
MCH: 27.6 pg (ref 26.0–34.0)
MCHC: 32.4 g/dL (ref 30.0–36.0)
MCV: 85.2 fL (ref 80.0–100.0)
Platelets: 258 10*3/uL (ref 150–400)
RBC: 4.06 MIL/uL — ABNORMAL LOW (ref 4.22–5.81)
RDW: 13.5 % (ref 11.5–15.5)
WBC: 8.2 10*3/uL (ref 4.0–10.5)
nRBC: 0.6 % — ABNORMAL HIGH (ref 0.0–0.2)

## 2018-09-03 LAB — GLUCOSE, CAPILLARY
Glucose-Capillary: 248 mg/dL — ABNORMAL HIGH (ref 70–99)
Glucose-Capillary: 354 mg/dL — ABNORMAL HIGH (ref 70–99)
Glucose-Capillary: 229 mg/dL — ABNORMAL HIGH (ref 70–99)
Glucose-Capillary: 386 mg/dL — ABNORMAL HIGH (ref 70–99)

## 2018-09-03 LAB — URINE CULTURE: Culture: <10000 — AB

## 2018-09-03 MED ORDER — INSULIN GLARGINE 100 UNIT/ML ~~LOC~~ SOLN
40.0000 [IU] | Freq: Every day | SUBCUTANEOUS | Status: DC
  Administered 2018-09-03: 40 [IU] via SUBCUTANEOUS
  Filled 2018-09-03 (×4): qty 0.4

## 2018-09-03 MED ORDER — RIFAMPIN 300 MG PO CAPS
600.0000 mg | ORAL_CAPSULE | Freq: Every day | ORAL | Status: DC
  Administered 2018-09-03 – 2018-09-10 (×8): 600 mg via ORAL
  Filled 2018-09-03 (×12): qty 2

## 2018-09-03 MED ORDER — AZITHROMYCIN 250 MG PO TABS
500.0000 mg | ORAL_TABLET | Freq: Every day | ORAL | Status: DC
  Administered 2018-09-03 – 2018-09-10 (×8): 500 mg via ORAL
  Filled 2018-09-03 (×8): qty 2

## 2018-09-03 MED ORDER — DOXYCYCLINE HYCLATE 100 MG PO TABS
100.0000 mg | ORAL_TABLET | Freq: Two times a day (BID) | ORAL | Status: DC
  Administered 2018-09-03 – 2018-09-10 (×15): 100 mg via ORAL
  Filled 2018-09-03 (×15): qty 1

## 2018-09-03 NOTE — Progress Notes (Signed)
Subjective: He feels better than yesterday.  He is still coughing but less.  He was able to walk to the nurses station yesterday.  I had extensive testing done yesterday because he was worse but none of that really shows anything much different.  His urinalysis did show a lot of glucose in his urine and I think that is the source of his urinary incontinence.  His blood sugar is better.  Objective: Vital signs in last 24 hours: Temp:  [97.5 F (36.4 C)-98.3 F (36.8 C)] 98.3 F (36.8 C) (02/21 0529) Pulse Rate:  [64-81] 64 (02/21 0529) Resp:  [20] 20 (02/21 0529) BP: (103-137)/(63-84) 103/63 (02/21 0529) SpO2:  [96 %-100 %] 96 % (02/21 0806) Weight change:  Last BM Date: 08/29/18  Intake/Output from previous day: 02/20 0701 - 02/21 0700 In: 1440 [P.O.:1440] Out: 500 [Urine:500]  PHYSICAL EXAM General appearance: alert, cooperative and mild distress Resp: rhonchi bilaterally Cardio: regular rate and rhythm, S1, S2 normal, no murmur, click, rub or gallop GI: soft, non-tender; bowel sounds normal; no masses,  no organomegaly Extremities: extremities normal, atraumatic, no cyanosis or edema  Lab Results:  Results for orders placed or performed during the hospital encounter of 08/26/18 (from the past 48 hour(s))  Glucose, capillary     Status: Abnormal   Collection Time: 09/01/18 11:11 AM  Result Value Ref Range   Glucose-Capillary 412 (H) 70 - 99 mg/dL  Glucose, capillary     Status: Abnormal   Collection Time: 09/01/18  4:01 PM  Result Value Ref Range   Glucose-Capillary 202 (H) 70 - 99 mg/dL   Comment 1 Notify RN    Comment 2 Document in Chart   Glucose, capillary     Status: Abnormal   Collection Time: 09/01/18 10:39 PM  Result Value Ref Range   Glucose-Capillary 438 (H) 70 - 99 mg/dL   Comment 1 Notify RN    Comment 2 Document in Chart   Creatinine, serum     Status: None   Collection Time: 09/02/18  4:16 AM  Result Value Ref Range   Creatinine, Ser 0.89 0.61 - 1.24  mg/dL   GFR calc non Af Amer >60 >60 mL/min   GFR calc Af Amer >60 >60 mL/min    Comment: Performed at [facility], [address] [place], [place] [address]  CBC with Differential/Platelet     Status: Abnormal   Collection Time: 09/02/18  5:55 AM  Result Value Ref Range   WBC 9.7 4.0 - 10.5 K/uL   RBC 4.32 4.22 - 5.81 MIL/uL   Hemoglobin 11.5 (L) 13.0 - 17.0 g/dL   HCT 37.1 (L) 39.0 - 52.0 %   MCV 85.9 80.0 - 100.0 fL   MCH 26.6 26.0 - 34.0 pg   MCHC 31.0 30.0 - 36.0 g/dL   RDW 13.5 11.5 - 15.5 %   Platelets 289 150 - 400 K/uL   nRBC 0.2 0.0 - 0.2 %   Neutrophils Relative % 69 %   Neutro Abs 6.7 1.7 - 7.7 K/uL   Lymphocytes Relative 18 %   Lymphs Abs 1.7 0.7 - 4.0 K/uL   Monocytes Relative 9 %   Monocytes Absolute 0.9 0.1 - 1.0 K/uL   Eosinophils Relative 0 %   Eosinophils Absolute 0.0 0.0 - 0.5 K/uL   Basophils Relative 0 %   Basophils Absolute 0.0 0.0 - 0.1 K/uL   Immature Granulocytes 4 %   Abs Immature Granulocytes 0.34 (H) 0.00 - 0.07 K/uL  Reactive, Benign Lymphocytes PRESENT     Comment: Performed at [facility], [address] [place], [place] [address]  Comprehensive metabolic panel     Status: Abnormal   Collection Time: 09/02/18  5:55 AM  Result Value Ref Range   Sodium 133 (L) 135 - 145 mmol/L   Potassium 4.0 3.5 - 5.1 mmol/L   Chloride 98 98 - 111 mmol/L   CO2 25 22 - 32 mmol/L   Glucose, Bld 295 (H) 70 - 99 mg/dL   BUN 24 (H) 6 - 20 mg/dL   Creatinine, Ser 0.91 0.61 - 1.24 mg/dL   Calcium 8.0 (L) 8.9 - 10.3 mg/dL   Total Protein 6.4 (L) 6.5 - 8.1 g/dL   Albumin 3.0 (L) 3.5 - 5.0 g/dL   AST 22 15 - 41 U/L   ALT 52 (H) 0 - 44 U/L   Alkaline Phosphatase 37 (L) 38 - 126 U/L   Total Bilirubin 0.3 0.3 - 1.2 mg/dL   GFR calc non Af Amer >60 >60 mL/min   GFR calc Af Amer >60 >60 mL/min   Anion gap 10 5 - 15    Comment: Performed at [facility], [address], [place], [place] [address]  Glucose, capillary     Status: Abnormal   Collection Time: 09/02/18   7:31 AM  Result Value Ref Range   Glucose-Capillary 261 (H) 70 - 99 mg/dL  Urinalysis, Routine w reflex microscopic     Status: Abnormal   Collection Time: 09/02/18  8:50 AM  Result Value Ref Range   Color, Urine YELLOW YELLOW   APPearance CLEAR CLEAR   Specific Gravity, Urine 1.027 1.005 - 1.030   pH 5.0 5.0 - 8.0   Glucose, UA >=500 (A) NEGATIVE mg/dL   Hgb urine dipstick MODERATE (A) NEGATIVE   Bilirubin Urine NEGATIVE NEGATIVE   Ketones, ur NEGATIVE NEGATIVE mg/dL   Protein, ur 30 (A) NEGATIVE mg/dL   Nitrite NEGATIVE NEGATIVE   Leukocytes,Ua NEGATIVE NEGATIVE   RBC / HPF 0-5 0 - 5 RBC/hpf   WBC, UA 0-5 0 - 5 WBC/hpf   Bacteria, UA NONE SEEN NONE SEEN   Mucus PRESENT    Uric Acid Crys, UA PRESENT     Comment: Performed at [facility], [address] [place], [place] [address]  Glucose, capillary     Status: Abnormal   Collection Time: 09/02/18 11:45 AM  Result Value Ref Range   Glucose-Capillary 353 (H) 70 - 99 mg/dL  Glucose, capillary     Status: Abnormal   Collection Time: 09/02/18  4:40 PM  Result Value Ref Range   Glucose-Capillary 165 (H) 70 - 99 mg/dL  Glucose, capillary     Status: Abnormal   Collection Time: 09/02/18  9:10 PM  Result Value Ref Range   Glucose-Capillary 357 (H) 70 - 99 mg/dL   Comment 1 Notify RN    Comment 2 Document in Chart   CBC     Status: Abnormal   Collection Time: 09/03/18  5:16 AM  Result Value Ref Range   WBC 8.2 4.0 - 10.5 K/uL   RBC 4.06 (L) 4.22 - 5.81 MIL/uL   Hemoglobin 11.2 (L) 13.0 - 17.0 g/dL   HCT 34.6 (L) 39.0 - 52.0 %   MCV 85.2 80.0 - 100.0 fL   MCH 27.6 26.0 - 34.0 pg   MCHC 32.4 30.0 - 36.0 g/dL   RDW 13.5 11.5 - 15.5 %   Platelets 258 150 - 400 K/uL  nRBC 0.6 (H) 0.0 - 0.2 %    Comment: Performed at [facility], [address], [place], [place] [address]  Glucose, capillary     Status: Abnormal   Collection Time: 09/03/18  7:33 AM  Result Value Ref Range   Glucose-Capillary 248 (H) 70 - 99 mg/dL     ABGS No results for input(s): PHART, PO2ART, TCO2, HCO3 in the last 72 hours.  Invalid input(s): PCO2 CULTURES Recent Results (from the past 240 hour(s))  Culture, blood (Routine X 2) w Reflex to ID Panel     Status: None   Collection Time: 08/26/18  8:34 PM  Result Value Ref Range Status   Specimen Description BLOOD RIGHT ANTECUBITAL  Final   Special Requests   Final    BOTTLES DRAWN AEROBIC AND ANAEROBIC Blood Culture adequate volume   Culture   Final    NO GROWTH 5 DAYS Performed at [facility], [address], [place], [place] [address]    Report Status 08/31/2018 FINAL  Final  Culture, blood (Routine X 2) w Reflex to ID Panel     Status: None   Collection Time: 08/26/18  8:38 PM  Result Value Ref Range Status   Specimen Description BLOOD RIGHT HAND  Final   Special Requests   Final    BOTTLES DRAWN AEROBIC AND ANAEROBIC Blood Culture adequate volume   Culture   Final    NO GROWTH 5 DAYS Performed at [facility], [address] [place], [place] [address]    Report Status 08/31/2018 FINAL  Final  MRSA PCR Screening     Status: None   Collection Time: 08/27/18  4:00 PM  Result Value Ref Range Status   MRSA by PCR NEGATIVE NEGATIVE Final    Comment:        The GeneXpert MRSA Assay (FDA approved for NASAL specimens only), is one component of a comprehensive MRSA colonization surveillance program. It is not intended to diagnose MRSA infection nor to guide or monitor treatment for MRSA infections. Performed at [facility], [address], [place], [place] [address]    Studies/Results: Ct Angio Chest Pe W Or Wo Contrast  Result Date: 09/02/2018 CLINICAL DATA:  Chronic dyspnea, shortness of breath, COPD, on oxygen therapy, difficulty breathing worse supine, worsening breathing since December 2019; history hypertension, type II diabetes mellitus, atrial fibrillation, collagen vascular disease, former smoker EXAM: CT ANGIOGRAPHY CHEST WITH CONTRAST TECHNIQUE:  Multidetector CT imaging of the chest was performed using the standard protocol during bolus administration of intravenous contrast. Multiplanar CT image reconstructions and MIPs were obtained to evaluate the vascular anatomy. CONTRAST:  120mL ISOVUE-370 IOPAMIDOL (ISOVUE-370) INJECTION 76% IV COMPARISON:  06/19/2018, 02/12/2017 FINDINGS: Cardiovascular: Atherosclerotic calcifications of thoracic aorta and coronary arteries. Aorta normal caliber. No aneurysm or dissection. Pulmonary arteries well opacified and patent. No evidence of pulmonary embolism. No pericardial effusion. Mediastinum/Nodes: Esophagus unremarkable. Tiny hiatal hernia. Base of cervical region normal appearance. No thoracic adenopathy. Lungs/Pleura: Severe emphysematous changes with bullet the apices. LEFT upper lobe scarring. Again identified slightly thicker walled cavitary focus at LEFT apex unchanged, with an area of dependent density or nodularity along the posterior aspect unchanged since previous exam as well as an earlier study from 2018. Additional parenchymal scarring in LEFT lower lobe, likely resolving infected bulla with thinner wall and less fluid/thickening than on previous exam. Minimal dependent RIGHT basilar atelectasis. No acute infiltrate, pleural effusion or pneumothorax. No definite pulmonary mass/nodule. Upper Abdomen: Unremarkable Musculoskeletal: Demineralized, otherwise unremarkable Review of the  MIP images confirms the above findings. IMPRESSION: No evidence pulmonary embolism. Scattered atherosclerotic calcifications aorta and coronary arteries. COPD changes with bullous disease at apices and at RIGHT base with scattered areas of parenchymal scarring and atelectasis grossly stable since prior study. Chronic thickening of the walls of a bulla/cavitary focus at the LEFT apex appears unchanged since 2018, recommend attention on follow-up imaging. Aortic Atherosclerosis (ICD10-I70.0) and Emphysema (ICD10-J43.9).  Electronically Signed   By: Lavonia Dana M.D.   On: 09/02/2018 12:15    Medications:  Prior to Admission:  Medications Prior to Admission  Medication Sig Dispense Refill Last Dose  . albuterol (VENTOLIN HFA) 108 (90 BASE) MCG/ACT inhaler Inhale 2 puffs into the lungs every 6 (six) hours as needed for wheezing or shortness of breath.   08/26/2018  . azithromycin (ZITHROMAX) 500 MG tablet Take 500 mg by mouth daily.   08/26/2018  . baclofen (LIORESAL) 20 MG tablet Take 20 mg by mouth 3 (three) times daily.    08/26/2018  . benzonatate (TESSALON) 200 MG capsule Take 200 mg by mouth 3 (three) times daily.   08/26/2018  . budesonide (PULMICORT) 0.25 MG/2ML nebulizer solution Take 2 mLs (0.25 mg total) by nebulization 2 (two) times daily. 60 mL 12 08/26/2018  . CARTIA XT 120 MG 24 hr capsule Take 1 capsule (120 mg total) by mouth daily. 7 capsule 0 08/26/2018  . diphenhydrAMINE (BENADRYL) 25 mg capsule Take 25 mg by mouth every 8 (eight) hours as needed. Felt mouth swelling at ED visit 08/16/18.   Past Week at Unknown time  . fexofenadine (ALLEGRA) 180 MG tablet Take 180 mg by mouth daily.   Past Week at Unknown time  . fluticasone (FLONASE) 50 MCG/ACT nasal spray Place 2 sprays into both nostrils daily as needed.   5 08/26/2018  . Fluticasone-Umeclidin-Vilant (TRELEGY ELLIPTA) 100-62.5-25 MCG/INH AEPB Inhale 1 puff into the lungs daily.   08/26/2018  . HYDROcodone-homatropine (HYCODAN) 5-1.5 MG/5ML syrup Take 5 mLs by mouth every 6 (six) hours as needed for cough.   08/26/2018  . insulin glargine (LANTUS) 100 UNIT/ML injection Inject 0.24 mLs (24 Units total) into the skin daily. 24 units daily (Patient taking differently: Inject 32 Units into the skin daily. ) 10 mL 11 08/26/2018  . insulin lispro (HUMALOG) 100 UNIT/ML injection Take by sliding scale provided by hospital maximum amount daily is 60 units.  Check blood sugar 4 times a day (Patient taking differently: Inject 0-15 Units into the skin 3 (three) times  daily with meals. Take by sliding scale provided by hospital maximum amount daily is 60 units.  Check blood sugar 4 times a day) 10 mL 11 08/26/2018  . ipratropium (ATROVENT) 0.02 % nebulizer solution Take 0.5 mg by nebulization 2 (two) times daily.   5 08/26/2018  . levalbuterol (XOPENEX) 0.63 MG/3ML nebulizer solution Take 0.63 mg by nebulization 2 (two) times daily.   08/26/2018  . levofloxacin (LEVAQUIN) 500 MG tablet Take 500 mg by mouth daily.   08/26/2018  . LORazepam (ATIVAN) 0.5 MG tablet Take 1 tablet (0.5 mg total) by mouth 2 (two) times daily as needed for anxiety or sleep. (Patient taking differently: Take 0.5 mg by mouth 4 (four) times daily. Pt takes it three times daily.) 20 tablet 0 08/26/2018  . metFORMIN (GLUCOPHAGE) 500 MG tablet Take 1 tablet (500 mg total) by mouth daily with breakfast. 30 tablet 3 08/26/2018  . nitroGLYCERIN (NITROSTAT) 0.4 MG SL tablet Place 1 tablet (0.4 mg total) under the tongue  every 5 (five) minutes as needed for chest pain. 25 tablet 4 08/26/2018  . omeprazole (PRILOSEC) 20 MG capsule Take 1 capsule (20 mg total) by mouth 2 (two) times daily before a meal. 180 capsule 3 08/26/2018  . oxyCODONE-acetaminophen (PERCOCET) 10-325 MG tablet Take 1 tablet by mouth every 4 (four) hours as needed for pain.   08/26/2018  . predniSONE (DELTASONE) 10 MG tablet Take 2 daily for 1 week then 1 daily (Patient taking differently: Take 20 mg by mouth daily with breakfast. Takes 2 tablets daily.) 60 tablet 5 08/26/2018  . rifampin (RIFADIN) 300 MG capsule Take 2 capsules (600 mg total) by mouth daily. 60 capsule 11 08/26/2018  . rosuvastatin (CRESTOR) 10 MG tablet Take 10 mg by mouth every morning.   11 08/26/2018  . predniSONE (DELTASONE) 10 MG tablet Take 2 tablets (20 mg total) by mouth daily. 6 tablet 0 Taking   Scheduled: . baclofen  20 mg Oral TID  . benzonatate  200 mg Oral TID  . chlorhexidine  15 mL Mouth Rinse BID  . diltiazem  120 mg Oral Daily  . enoxaparin (LOVENOX)  injection  40 mg Subcutaneous Q24H  . fluticasone  2 spray Each Nare Daily  . fluticasone furoate-vilanterol  1 puff Inhalation Daily   And  . umeclidinium bromide  1 puff Inhalation Daily  . guaiFENesin  1,200 mg Oral BID  . insulin aspart  0-15 Units Subcutaneous TID WC  . insulin aspart  0-5 Units Subcutaneous QHS  . insulin aspart  8 Units Subcutaneous TID WC  . insulin glargine  40 Units Subcutaneous Daily  . ipratropium  0.5 mg Nebulization Q6H WA  . levalbuterol  0.63 mg Nebulization Q6H WA  . mouth rinse  15 mL Mouth Rinse q12n4p  . methylPREDNISolone (SOLU-MEDROL) injection  40 mg Intravenous Q12H  . pantoprazole  40 mg Oral Daily  . rosuvastatin  10 mg Oral q1800   Continuous: . sodium chloride 90 mL/hr at 08/26/18 2220   YIR:SWNIOE chloride, alum & mag hydroxide-simeth, diphenhydrAMINE, HYDROmorphone, LORazepam, oxyCODONE-acetaminophen **AND** oxyCODONE, sodium chloride  Assesment: He was admitted with healthcare associated pneumonia that is associated with influenza A.  He had been improving steadily and then yesterday had a setback.  I rechecked CT because of concerns about his myco bacterial infection and also to be sure that he did not have pulmonary embolus and none of that showed.  He still does show cavitary lesions on his CT but these are chronic and basically unchanged  He has severe COPD at baseline and is being treated for COPD exacerbation  He has diabetes and his blood sugars under better control  He has incontinence of urine that I think is related to his diabetes and I expect that should improve  He had influenza A and has finished his treatment for that  He has finished 7 days of IV antibiotics but considering his worsening yesterday I am going to put him on oral doxycycline Principal Problem:   HCAP (healthcare-associated pneumonia) Active Problems:   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  quit smoking 05/01/15    Type 2 diabetes mellitus without complication  (HCC)   COPD exacerbation ([place])   Pulmonary fibrosis ([place])   Acute on chronic respiratory failure with hypoxia ([place])    Plan: Continue treatments.  Continue physical therapy    LOS: 8 days   Alonza Bogus 09/03/2018, 8:07 AM

## 2018-09-03 NOTE — Care Management Important Message (Signed)
Important Message  Patient Details  Name: MONROE QIN MRN: [id number] Date of Birth: 10-Sep-1960   Medicare Important Message Given:  Yes    Agustin Swatek, Chauncey [place], RN 09/03/2018, 11:57 AM

## 2018-09-03 NOTE — Progress Notes (Signed)
PT Cancellation Note  Patient Details Name: Ryan [name] MRN: [id number] DOB: 03/21/1961   Cancelled Treatment:    Reason Eval/Treat Not Completed: Patient declined, no reason specified Attempted PT session, pt supine in bed and resting.  Pt requested to hold therapy currently but might attempt later today.  Assured call bell within reach prior leaving room.  [address], LPTA; [place] Aldona Lento 09/03/2018, 9:30 AM

## 2018-09-03 NOTE — Plan of Care (Signed)
  Problem: Clinical Measurements: Goal: Ability to maintain clinical measurements within normal limits will improve Outcome: Progressing Goal: Will remain free from infection Outcome: Progressing Goal: Diagnostic test results will improve Outcome: Progressing Goal: Respiratory complications will improve Outcome: Progressing   Problem: Activity: Goal: Risk for activity intolerance will decrease Outcome: Progressing   Problem: Coping: Goal: Level of anxiety will decrease Outcome: Progressing   Problem: Pain Managment: Goal: General experience of comfort will improve Outcome: Progressing   Problem: Safety: Goal: Ability to remain free from injury will improve Outcome: Progressing   Problem: Activity: Goal: Ability to tolerate increased activity will improve Outcome: Progressing

## 2018-09-04 LAB — GLUCOSE, CAPILLARY
GLUCOSE-CAPILLARY: 160 mg/dL — AB (ref 70–99)
Glucose-Capillary: 305 mg/dL — ABNORMAL HIGH (ref 70–99)
Glucose-Capillary: 312 mg/dL — ABNORMAL HIGH (ref 70–99)
Glucose-Capillary: 242 mg/dL — ABNORMAL HIGH (ref 70–99)
Glucose-Capillary: 251 mg/dL — ABNORMAL HIGH (ref 70–99)

## 2018-09-04 MED ORDER — INSULIN GLARGINE 100 UNIT/ML ~~LOC~~ SOLN
45.0000 [IU] | Freq: Every day | SUBCUTANEOUS | Status: DC
  Administered 2018-09-04 – 2018-09-10 (×7): 45 [IU] via SUBCUTANEOUS
  Filled 2018-09-04 (×11): qty 0.45

## 2018-09-04 NOTE — Progress Notes (Signed)
Pt is asleep

## 2018-09-04 NOTE — Progress Notes (Signed)
Subjective: He feels a little bit better but he is still very weak.  More short of breath than usual.  He has been able to get his oxygenation okay at 6 L which is his home dose.  He still has hyperglycemia.  Objective: Vital signs in last 24 hours: Temp:  [97.6 F (36.4 C)-98.2 F (36.8 C)] 97.6 F (36.4 C) (02/22 0537) Pulse Rate:  [80-90] 80 (02/22 0537) Resp:  [19-20] 20 (02/22 0537) BP: (111-128)/(72-88) 128/88 (02/22 0537) SpO2:  [95 %-99 %] 96 % (02/22 0815) Weight change:  Last BM Date: 08/29/18  Intake/Output from previous day: 02/21 0701 - 02/22 0700 In: 840 [P.O.:840] Out: 350 [Urine:350]  PHYSICAL EXAM General appearance: alert, cooperative and mild distress Resp: He has rales in the left base and bilateral rhonchi mostly in the lower lobes Cardio: regular rate and rhythm, S1, S2 normal, no murmur, click, rub or gallop GI: soft, non-tender; bowel sounds normal; no masses,  no organomegaly Extremities: extremities normal, atraumatic, no cyanosis or edema  Lab Results:  Results for orders placed or performed during the hospital encounter of 08/26/18 (from the past 48 hour(s))  Glucose, capillary     Status: Abnormal   Collection Time: 09/02/18 11:45 AM  Result Value Ref Range   Glucose-Capillary 353 (H) 70 - 99 mg/dL  Glucose, capillary     Status: Abnormal   Collection Time: 09/02/18  4:40 PM  Result Value Ref Range   Glucose-Capillary 165 (H) 70 - 99 mg/dL  Glucose, capillary     Status: Abnormal   Collection Time: 09/02/18  9:10 PM  Result Value Ref Range   Glucose-Capillary 357 (H) 70 - 99 mg/dL   Comment 1 Notify RN    Comment 2 Document in Chart   CBC     Status: Abnormal   Collection Time: 09/03/18  5:16 AM  Result Value Ref Range   WBC 8.2 4.0 - 10.5 K/uL   RBC 4.06 (L) 4.22 - 5.81 MIL/uL   Hemoglobin 11.2 (L) 13.0 - 17.0 g/dL   HCT 34.6 (L) 39.0 - 52.0 %   MCV 85.2 80.0 - 100.0 fL   MCH 27.6 26.0 - 34.0 pg   MCHC 32.4 30.0 - 36.0 g/dL   RDW  13.5 11.5 - 15.5 %   Platelets 258 150 - 400 K/uL   nRBC 0.6 (H) 0.0 - 0.2 %    Comment: Performed at [facility], [address] [place], [place] [address]  Glucose, capillary     Status: Abnormal   Collection Time: 09/03/18  7:33 AM  Result Value Ref Range   Glucose-Capillary 248 (H) 70 - 99 mg/dL  Glucose, capillary     Status: Abnormal   Collection Time: 09/03/18 11:45 AM  Result Value Ref Range   Glucose-Capillary 354 (H) 70 - 99 mg/dL  Glucose, capillary     Status: Abnormal   Collection Time: 09/03/18  4:35 PM  Result Value Ref Range   Glucose-Capillary 229 (H) 70 - 99 mg/dL  Glucose, capillary     Status: Abnormal   Collection Time: 09/03/18 10:12 PM  Result Value Ref Range   Glucose-Capillary 386 (H) 70 - 99 mg/dL  Glucose, capillary     Status: Abnormal   Collection Time: 09/04/18  8:06 AM  Result Value Ref Range   Glucose-Capillary 305 (H) 70 - 99 mg/dL   Comment 1 Notify RN    Comment 2 Document in Chart     ABGS No results for input(s):  PHART, PO2ART, TCO2, HCO3 in the last 72 hours.  Invalid input(s): PCO2 CULTURES Recent Results (from the past 240 hour(s))  Culture, blood (Routine X 2) w Reflex to ID Panel     Status: None   Collection Time: 08/26/18  8:34 PM  Result Value Ref Range Status   Specimen Description BLOOD RIGHT ANTECUBITAL  Final   Special Requests   Final    BOTTLES DRAWN AEROBIC AND ANAEROBIC Blood Culture adequate volume   Culture   Final    NO GROWTH 5 DAYS Performed at [facility], [address], [place], [place] [address]    Report Status 08/31/2018 FINAL  Final  Culture, blood (Routine X 2) w Reflex to ID Panel     Status: None   Collection Time: 08/26/18  8:38 PM  Result Value Ref Range Status   Specimen Description BLOOD RIGHT HAND  Final   Special Requests   Final    BOTTLES DRAWN AEROBIC AND ANAEROBIC Blood Culture adequate volume   Culture   Final    NO GROWTH 5 DAYS Performed at [facility], [address],  [place], [place] [address]    Report Status 08/31/2018 FINAL  Final  MRSA PCR Screening     Status: None   Collection Time: 08/27/18  4:00 PM  Result Value Ref Range Status   MRSA by PCR NEGATIVE NEGATIVE Final    Comment:        The GeneXpert MRSA Assay (FDA approved for NASAL specimens only), is one component of a comprehensive MRSA colonization surveillance program. It is not intended to diagnose MRSA infection nor to guide or monitor treatment for MRSA infections. Performed at [facility], [address], [place], [place] [address]   Urine Culture     Status: Abnormal   Collection Time: 09/02/18  8:50 AM  Result Value Ref Range Status   Specimen Description   Final    URINE, CLEAN CATCH Performed at [facility], [address] [place], [place] [address]    Special Requests   Final    NONE Performed at [facility], [address] [place], [place] [address]    Culture (A)  Final    <10,000 COLONIES/mL INSIGNIFICANT GROWTH Performed at [facility] [address], [place], [place] [address]    Report Status 09/03/2018 FINAL  Final   Studies/Results: Ct Angio Chest Pe W Or Wo Contrast  Result Date: 09/02/2018 CLINICAL DATA:  Chronic dyspnea, shortness of breath, COPD, on oxygen therapy, difficulty breathing worse supine, worsening breathing since December 2019; history hypertension, type II diabetes mellitus, atrial fibrillation, collagen vascular disease, former smoker EXAM: CT ANGIOGRAPHY CHEST WITH CONTRAST TECHNIQUE: Multidetector CT imaging of the chest was performed using the standard protocol during bolus administration of intravenous contrast. Multiplanar CT image reconstructions and MIPs were obtained to evaluate the vascular anatomy. CONTRAST:  174mL ISOVUE-370 IOPAMIDOL (ISOVUE-370) INJECTION 76% IV COMPARISON:  06/19/2018, 02/12/2017 FINDINGS: Cardiovascular: Atherosclerotic calcifications of thoracic aorta and coronary arteries. Aorta normal caliber. No aneurysm  or dissection. Pulmonary arteries well opacified and patent. No evidence of pulmonary embolism. No pericardial effusion. Mediastinum/Nodes: Esophagus unremarkable. Tiny hiatal hernia. Base of cervical region normal appearance. No thoracic adenopathy. Lungs/Pleura: Severe emphysematous changes with bullet the apices. LEFT upper lobe scarring. Again identified slightly thicker walled cavitary focus at LEFT apex unchanged, with an area of dependent density or nodularity along the posterior aspect unchanged since previous exam as well as an earlier study from 2018. Additional parenchymal  scarring in LEFT lower lobe, likely resolving infected bulla with thinner wall and less fluid/thickening than on previous exam. Minimal dependent RIGHT basilar atelectasis. No acute infiltrate, pleural effusion or pneumothorax. No definite pulmonary mass/nodule. Upper Abdomen: Unremarkable Musculoskeletal: Demineralized, otherwise unremarkable Review of the MIP images confirms the above findings. IMPRESSION: No evidence pulmonary embolism. Scattered atherosclerotic calcifications aorta and coronary arteries. COPD changes with bullous disease at apices and at RIGHT base with scattered areas of parenchymal scarring and atelectasis grossly stable since prior study. Chronic thickening of the walls of a bulla/cavitary focus at the LEFT apex appears unchanged since 2018, recommend attention on follow-up imaging. Aortic Atherosclerosis (ICD10-I70.0) and Emphysema (ICD10-J43.9). Electronically Signed   By: Lavonia Dana M.D.   On: 09/02/2018 12:15    Medications:  Prior to Admission:  Medications Prior to Admission  Medication Sig Dispense Refill Last Dose  . albuterol (VENTOLIN HFA) 108 (90 BASE) MCG/ACT inhaler Inhale 2 puffs into the lungs every 6 (six) hours as needed for wheezing or shortness of breath.   08/26/2018  . azithromycin (ZITHROMAX) 500 MG tablet Take 500 mg by mouth daily.   08/26/2018  . baclofen (LIORESAL) 20 MG tablet  Take 20 mg by mouth 3 (three) times daily.    08/26/2018  . benzonatate (TESSALON) 200 MG capsule Take 200 mg by mouth 3 (three) times daily.   08/26/2018  . budesonide (PULMICORT) 0.25 MG/2ML nebulizer solution Take 2 mLs (0.25 mg total) by nebulization 2 (two) times daily. 60 mL 12 08/26/2018  . CARTIA XT 120 MG 24 hr capsule Take 1 capsule (120 mg total) by mouth daily. 7 capsule 0 08/26/2018  . diphenhydrAMINE (BENADRYL) 25 mg capsule Take 25 mg by mouth every 8 (eight) hours as needed. Felt mouth swelling at ED visit 08/16/18.   Past Week at Unknown time  . fexofenadine (ALLEGRA) 180 MG tablet Take 180 mg by mouth daily.   Past Week at Unknown time  . fluticasone (FLONASE) 50 MCG/ACT nasal spray Place 2 sprays into both nostrils daily as needed.   5 08/26/2018  . Fluticasone-Umeclidin-Vilant (TRELEGY ELLIPTA) 100-62.5-25 MCG/INH AEPB Inhale 1 puff into the lungs daily.   08/26/2018  . HYDROcodone-homatropine (HYCODAN) 5-1.5 MG/5ML syrup Take 5 mLs by mouth every 6 (six) hours as needed for cough.   08/26/2018  . insulin glargine (LANTUS) 100 UNIT/ML injection Inject 0.24 mLs (24 Units total) into the skin daily. 24 units daily (Patient taking differently: Inject 32 Units into the skin daily. ) 10 mL 11 08/26/2018  . insulin lispro (HUMALOG) 100 UNIT/ML injection Take by sliding scale provided by hospital maximum amount daily is 60 units.  Check blood sugar 4 times a day (Patient taking differently: Inject 0-15 Units into the skin 3 (three) times daily with meals. Take by sliding scale provided by hospital maximum amount daily is 60 units.  Check blood sugar 4 times a day) 10 mL 11 08/26/2018  . ipratropium (ATROVENT) 0.02 % nebulizer solution Take 0.5 mg by nebulization 2 (two) times daily.   5 08/26/2018  . levalbuterol (XOPENEX) 0.63 MG/3ML nebulizer solution Take 0.63 mg by nebulization 2 (two) times daily.   08/26/2018  . levofloxacin (LEVAQUIN) 500 MG tablet Take 500 mg by mouth daily.   08/26/2018  .  LORazepam (ATIVAN) 0.5 MG tablet Take 1 tablet (0.5 mg total) by mouth 2 (two) times daily as needed for anxiety or sleep. (Patient taking differently: Take 0.5 mg by mouth 4 (four) times daily. Pt takes it three  times daily.) 20 tablet 0 08/26/2018  . metFORMIN (GLUCOPHAGE) 500 MG tablet Take 1 tablet (500 mg total) by mouth daily with breakfast. 30 tablet 3 08/26/2018  . nitroGLYCERIN (NITROSTAT) 0.4 MG SL tablet Place 1 tablet (0.4 mg total) under the tongue every 5 (five) minutes as needed for chest pain. 25 tablet 4 08/26/2018  . omeprazole (PRILOSEC) 20 MG capsule Take 1 capsule (20 mg total) by mouth 2 (two) times daily before a meal. 180 capsule 3 08/26/2018  . oxyCODONE-acetaminophen (PERCOCET) 10-325 MG tablet Take 1 tablet by mouth every 4 (four) hours as needed for pain.   08/26/2018  . predniSONE (DELTASONE) 10 MG tablet Take 2 daily for 1 week then 1 daily (Patient taking differently: Take 20 mg by mouth daily with breakfast. Takes 2 tablets daily.) 60 tablet 5 08/26/2018  . rifampin (RIFADIN) 300 MG capsule Take 2 capsules (600 mg total) by mouth daily. 60 capsule 11 08/26/2018  . rosuvastatin (CRESTOR) 10 MG tablet Take 10 mg by mouth every morning.   11 08/26/2018  . predniSONE (DELTASONE) 10 MG tablet Take 2 tablets (20 mg total) by mouth daily. 6 tablet 0 Taking   Scheduled: . azithromycin  500 mg Oral Daily  . baclofen  20 mg Oral TID  . benzonatate  200 mg Oral TID  . chlorhexidine  15 mL Mouth Rinse BID  . diltiazem  120 mg Oral Daily  . doxycycline  100 mg Oral Q12H  . enoxaparin (LOVENOX) injection  40 mg Subcutaneous Q24H  . fluticasone  2 spray Each Nare Daily  . fluticasone furoate-vilanterol  1 puff Inhalation Daily   And  . umeclidinium bromide  1 puff Inhalation Daily  . guaiFENesin  1,200 mg Oral BID  . insulin aspart  0-15 Units Subcutaneous TID WC  . insulin aspart  0-5 Units Subcutaneous QHS  . insulin aspart  8 Units Subcutaneous TID WC  . insulin glargine  45  Units Subcutaneous Daily  . ipratropium  0.5 mg Nebulization Q6H WA  . levalbuterol  0.63 mg Nebulization Q6H WA  . mouth rinse  15 mL Mouth Rinse q12n4p  . methylPREDNISolone (SOLU-MEDROL) injection  40 mg Intravenous Q12H  . pantoprazole  40 mg Oral Daily  . rifampin  600 mg Oral Daily  . rosuvastatin  10 mg Oral q1800   Continuous: . sodium chloride 90 mL/hr at 08/26/18 2220   DGL:OVFIEP chloride, alum & mag hydroxide-simeth, diphenhydrAMINE, HYDROmorphone, LORazepam, oxyCODONE-acetaminophen **AND** oxyCODONE, sodium chloride  Assesment: He was admitted with healthcare associated pneumonia that was associated with influenza A.  At baseline he has severe COPD.  He has taken much longer to recuperate than he typically does from a COPD exacerbation and I think that is mostly because of the influenza.  He remains very weak and more short of breath with minimal exertion than usual.  His oxygenation has improved.  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 Principal Problem:   HCAP (healthcare-associated pneumonia) Active Problems:   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  quit smoking 05/01/15    Type 2 diabetes mellitus without complication (HCC)   COPD exacerbation (HCC)   Pulmonary fibrosis (HCC)   Acute on chronic respiratory failure with hypoxia ([place])    Plan: Continue treatments.  Anticipate continued slow improvement I think if I send him home now he will be back within 48 hours    LOS: 9 days   Alonza Bogus 09/04/2018, 9:32 AM

## 2018-09-05 ENCOUNTER — Inpatient Hospital Stay (HOSPITAL_COMMUNITY): Payer: Medicare Other

## 2018-09-05 DIAGNOSIS — J09X2 Influenza due to identified novel influenza A virus with other respiratory manifestations: Secondary | ICD-10-CM | POA: Diagnosis present

## 2018-09-05 LAB — GLUCOSE, CAPILLARY
GLUCOSE-CAPILLARY: 247 mg/dL — AB (ref 70–99)
Glucose-Capillary: 261 mg/dL — ABNORMAL HIGH (ref 70–99)
Glucose-Capillary: 184 mg/dL — ABNORMAL HIGH (ref 70–99)
Glucose-Capillary: 260 mg/dL — ABNORMAL HIGH (ref 70–99)

## 2018-09-05 MED ORDER — POTASSIUM CHLORIDE CRYS ER 20 MEQ PO TBCR
20.0000 meq | EXTENDED_RELEASE_TABLET | Freq: Two times a day (BID) | ORAL | Status: DC
  Administered 2018-09-05 – 2018-09-10 (×11): 20 meq via ORAL
  Filled 2018-09-05 (×11): qty 1

## 2018-09-05 MED ORDER — FUROSEMIDE 10 MG/ML IJ SOLN
40.0000 mg | Freq: Two times a day (BID) | INTRAMUSCULAR | Status: DC
  Administered 2018-09-05 – 2018-09-10 (×11): 40 mg via INTRAVENOUS
  Filled 2018-09-05 (×11): qty 4

## 2018-09-05 NOTE — Progress Notes (Signed)
Subjective: He says he feels better.  He is was able to ambulate better.  However he is complaining of pain and swelling of his right greater than left foot.  No known injury but he is on chronic steroids so is at high risk of osteoporotic stress fracture.  Objective: Vital signs in last 24 hours: Temp:  [97.6 F (36.4 C)-98.1 F (36.7 C)] 98 F (36.7 C) (02/23 0515) Pulse Rate:  [85-107] 88 (02/23 0515) Resp:  [20] 20 (02/23 0515) BP: (115-137)/(72-84) 137/78 (02/23 0515) SpO2:  [96 %-99 %] 98 % (02/23 0742) Weight change:  Last BM Date: 09/04/18  Intake/Output from previous day: 02/22 0701 - 02/23 0700 In: 1798 [P.O.:1798] Out: 325 [Urine:325]  PHYSICAL EXAM General appearance: alert, cooperative and no distress Resp: rhonchi bilaterally Cardio: regular rate and rhythm, S1, S2 normal, no murmur, click, rub or gallop GI: soft, non-tender; bowel sounds normal; no masses,  no organomegaly Extremities: His right foot is swollen and tender but without a lot of erythema.  He has 1 plus edema of both legs right is a little bit worse  Lab Results:  Results for orders placed or performed during the hospital encounter of 08/26/18 (from the past 48 hour(s))  Glucose, capillary     Status: Abnormal   Collection Time: 09/03/18 11:45 AM  Result Value Ref Range   Glucose-Capillary 354 (H) 70 - 99 mg/dL  Glucose, capillary     Status: Abnormal   Collection Time: 09/03/18  4:35 PM  Result Value Ref Range   Glucose-Capillary 229 (H) 70 - 99 mg/dL  Glucose, capillary     Status: Abnormal   Collection Time: 09/03/18 10:12 PM  Result Value Ref Range   Glucose-Capillary 386 (H) 70 - 99 mg/dL  Glucose, capillary     Status: Abnormal   Collection Time: 09/04/18  8:06 AM  Result Value Ref Range   Glucose-Capillary 305 (H) 70 - 99 mg/dL   Comment 1 Notify RN    Comment 2 Document in Chart   Glucose, capillary     Status: Abnormal   Collection Time: 09/04/18 11:25 AM  Result Value Ref Range    Glucose-Capillary 312 (H) 70 - 99 mg/dL   Comment 1 Notify RN    Comment 2 Document in Chart   Glucose, capillary     Status: Abnormal   Collection Time: 09/04/18  4:40 PM  Result Value Ref Range   Glucose-Capillary 160 (H) 70 - 99 mg/dL   Comment 1 Notify RN    Comment 2 Document in Chart   Glucose, capillary     Status: Abnormal   Collection Time: 09/04/18  8:48 PM  Result Value Ref Range   Glucose-Capillary 242 (H) 70 - 99 mg/dL   Comment 1 Notify RN    Comment 2 Document in Chart   Glucose, capillary     Status: Abnormal   Collection Time: 09/04/18 10:27 PM  Result Value Ref Range   Glucose-Capillary 251 (H) 70 - 99 mg/dL  Glucose, capillary     Status: Abnormal   Collection Time: 09/05/18  7:40 AM  Result Value Ref Range   Glucose-Capillary 247 (H) 70 - 99 mg/dL   Comment 1 Notify RN    Comment 2 Document in Chart     ABGS No results for input(s): PHART, PO2ART, TCO2, HCO3 in the last 72 hours.  Invalid input(s): PCO2 CULTURES Recent Results (from the past 240 hour(s))  Culture, blood (Routine X 2) w Reflex to ID  Panel     Status: None   Collection Time: 08/26/18  8:34 PM  Result Value Ref Range Status   Specimen Description BLOOD RIGHT ANTECUBITAL  Final   Special Requests   Final    BOTTLES DRAWN AEROBIC AND ANAEROBIC Blood Culture adequate volume   Culture   Final    NO GROWTH 5 DAYS Performed at [facility], [address], [place], [place] [address]    Report Status 08/31/2018 FINAL  Final  Culture, blood (Routine X 2) w Reflex to ID Panel     Status: None   Collection Time: 08/26/18  8:38 PM  Result Value Ref Range Status   Specimen Description BLOOD RIGHT HAND  Final   Special Requests   Final    BOTTLES DRAWN AEROBIC AND ANAEROBIC Blood Culture adequate volume   Culture   Final    NO GROWTH 5 DAYS Performed at [facility], [address], [place], [place] [address]    Report Status 08/31/2018 FINAL  Final  MRSA PCR Screening     Status: None    Collection Time: 08/27/18  4:00 PM  Result Value Ref Range Status   MRSA by PCR NEGATIVE NEGATIVE Final    Comment:        The GeneXpert MRSA Assay (FDA approved for NASAL specimens only), is one component of a comprehensive MRSA colonization surveillance program. It is not intended to diagnose MRSA infection nor to guide or monitor treatment for MRSA infections. Performed at [facility], [address], [place], [place] [address]   Urine Culture     Status: Abnormal   Collection Time: 09/02/18  8:50 AM  Result Value Ref Range Status   Specimen Description   Final    URINE, CLEAN CATCH Performed at [facility], [address] [place], [place] [address]    Special Requests   Final    NONE Performed at [facility], [address] [place], [place] [address]    Culture (A)  Final    <10,000 COLONIES/mL INSIGNIFICANT GROWTH Performed at [place] [address], [place], [place] [address]    Report Status 09/03/2018 FINAL  Final   Studies/Results: No results found.  Medications:  Prior to Admission:  Medications Prior to Admission  Medication Sig Dispense Refill Last Dose  . albuterol (VENTOLIN HFA) 108 (90 BASE) MCG/ACT inhaler Inhale 2 puffs into the lungs every 6 (six) hours as needed for wheezing or shortness of breath.   08/26/2018  . azithromycin (ZITHROMAX) 500 MG tablet Take 500 mg by mouth daily.   08/26/2018  . baclofen (LIORESAL) 20 MG tablet Take 20 mg by mouth 3 (three) times daily.    08/26/2018  . benzonatate (TESSALON) 200 MG capsule Take 200 mg by mouth 3 (three) times daily.   08/26/2018  . budesonide (PULMICORT) 0.25 MG/2ML nebulizer solution Take 2 mLs (0.25 mg total) by nebulization 2 (two) times daily. 60 mL 12 08/26/2018  . CARTIA XT 120 MG 24 hr capsule Take 1 capsule (120 mg total) by mouth daily. 7 capsule 0 08/26/2018  . diphenhydrAMINE (BENADRYL) 25 mg capsule Take 25 mg by mouth every 8 (eight) hours as needed. Felt mouth swelling at ED visit  08/16/18.   Past Week at Unknown time  . fexofenadine (ALLEGRA) 180 MG tablet Take 180 mg by mouth daily.   Past Week at Unknown time  . fluticasone (FLONASE) 50 MCG/ACT nasal spray Place 2 sprays into both nostrils daily as needed.  5 08/26/2018  . Fluticasone-Umeclidin-Vilant (TRELEGY ELLIPTA) 100-62.5-25 MCG/INH AEPB Inhale 1 puff into the lungs daily.   08/26/2018  . HYDROcodone-homatropine (HYCODAN) 5-1.5 MG/5ML syrup Take 5 mLs by mouth every 6 (six) hours as needed for cough.   08/26/2018  . insulin glargine (LANTUS) 100 UNIT/ML injection Inject 0.24 mLs (24 Units total) into the skin daily. 24 units daily (Patient taking differently: Inject 32 Units into the skin daily. ) 10 mL 11 08/26/2018  . insulin lispro (HUMALOG) 100 UNIT/ML injection Take by sliding scale provided by hospital maximum amount daily is 60 units.  Check blood sugar 4 times a day (Patient taking differently: Inject 0-15 Units into the skin 3 (three) times daily with meals. Take by sliding scale provided by hospital maximum amount daily is 60 units.  Check blood sugar 4 times a day) 10 mL 11 08/26/2018  . ipratropium (ATROVENT) 0.02 % nebulizer solution Take 0.5 mg by nebulization 2 (two) times daily.   5 08/26/2018  . levalbuterol (XOPENEX) 0.63 MG/3ML nebulizer solution Take 0.63 mg by nebulization 2 (two) times daily.   08/26/2018  . levofloxacin (LEVAQUIN) 500 MG tablet Take 500 mg by mouth daily.   08/26/2018  . LORazepam (ATIVAN) 0.5 MG tablet Take 1 tablet (0.5 mg total) by mouth 2 (two) times daily as needed for anxiety or sleep. (Patient taking differently: Take 0.5 mg by mouth 4 (four) times daily. Pt takes it three times daily.) 20 tablet 0 08/26/2018  . metFORMIN (GLUCOPHAGE) 500 MG tablet Take 1 tablet (500 mg total) by mouth daily with breakfast. 30 tablet 3 08/26/2018  . nitroGLYCERIN (NITROSTAT) 0.4 MG SL tablet Place 1 tablet (0.4 mg total) under the tongue every 5 (five) minutes as needed for chest pain. 25 tablet 4  08/26/2018  . omeprazole (PRILOSEC) 20 MG capsule Take 1 capsule (20 mg total) by mouth 2 (two) times daily before a meal. 180 capsule 3 08/26/2018  . oxyCODONE-acetaminophen (PERCOCET) 10-325 MG tablet Take 1 tablet by mouth every 4 (four) hours as needed for pain.   08/26/2018  . predniSONE (DELTASONE) 10 MG tablet Take 2 daily for 1 week then 1 daily (Patient taking differently: Take 20 mg by mouth daily with breakfast. Takes 2 tablets daily.) 60 tablet 5 08/26/2018  . rifampin (RIFADIN) 300 MG capsule Take 2 capsules (600 mg total) by mouth daily. 60 capsule 11 08/26/2018  . rosuvastatin (CRESTOR) 10 MG tablet Take 10 mg by mouth every morning.   11 08/26/2018  . predniSONE (DELTASONE) 10 MG tablet Take 2 tablets (20 mg total) by mouth daily. 6 tablet 0 Taking   Scheduled: . azithromycin  500 mg Oral Daily  . baclofen  20 mg Oral TID  . benzonatate  200 mg Oral TID  . chlorhexidine  15 mL Mouth Rinse BID  . diltiazem  120 mg Oral Daily  . doxycycline  100 mg Oral Q12H  . enoxaparin (LOVENOX) injection  40 mg Subcutaneous Q24H  . fluticasone  2 spray Each Nare Daily  . fluticasone furoate-vilanterol  1 puff Inhalation Daily   And  . umeclidinium bromide  1 puff Inhalation Daily  . furosemide  40 mg Intravenous Q12H  . guaiFENesin  1,200 mg Oral BID  . insulin aspart  0-15 Units Subcutaneous TID WC  . insulin aspart  0-5 Units Subcutaneous QHS  . insulin aspart  8 Units Subcutaneous TID WC  . insulin glargine  45 Units Subcutaneous Daily  . ipratropium  0.5 mg Nebulization Q6H  WA  . levalbuterol  0.63 mg Nebulization Q6H WA  . mouth rinse  15 mL Mouth Rinse q12n4p  . methylPREDNISolone (SOLU-MEDROL) injection  40 mg Intravenous Q12H  . pantoprazole  40 mg Oral Daily  . potassium chloride  20 mEq Oral BID  . rifampin  600 mg Oral Daily  . rosuvastatin  10 mg Oral q1800   Continuous: . sodium chloride 90 mL/hr at 08/26/18 2220   XTK:WIOXBD chloride, alum & mag hydroxide-simeth,  diphenhydrAMINE, HYDROmorphone, LORazepam, oxyCODONE-acetaminophen **AND** oxyCODONE, sodium chloride  Assesment: He was admitted with healthcare associated pneumonia that was associated with influenza A.  He is better.  At baseline he has severe COPD and he is having exacerbation which is being treated  He has pulmonary mycobacterial infection and is being treated for that  He has diabetes with much better control  He has acute on chronic hypoxic respiratory failure and he is back at his baseline oxygen  He has acute on chronic diastolic heart failure and that will be treated with Lasix  He has foot pain and will have x-ray of his foot Principal Problem:   HCAP (healthcare-associated pneumonia) Active Problems:   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  quit smoking 05/01/15    Type 2 diabetes mellitus without complication (HCC)   COPD exacerbation ([place])   Pulmonary fibrosis ([place])   Acute on chronic respiratory failure with hypoxia ([place])    Plan: X-ray his foot.  Add Lasix.  Still potential for discharge tomorrow    LOS: 10 days   Alonza Bogus 09/05/2018, 9:29 AM

## 2018-09-06 LAB — GLUCOSE, CAPILLARY
GLUCOSE-CAPILLARY: 374 mg/dL — AB (ref 70–99)
Glucose-Capillary: 248 mg/dL — ABNORMAL HIGH (ref 70–99)
Glucose-Capillary: 338 mg/dL — ABNORMAL HIGH (ref 70–99)
Glucose-Capillary: 144 mg/dL — ABNORMAL HIGH (ref 70–99)

## 2018-09-06 NOTE — Progress Notes (Signed)
Subjective: He still complains of pain in his feet.  The right is worse.  He has somewhat less edema.  His x-ray was negative for fracture.  Objective: Vital signs in last 24 hours: Temp:  [97.5 F (36.4 C)-98.9 F (37.2 C)] 97.5 F (36.4 C) (02/24 0531) Pulse Rate:  [89-98] 89 (02/24 0531) Resp:  [18-19] 18 (02/24 0531) BP: (105-121)/(70-84) 113/81 (02/24 0531) SpO2:  [97 %-98 %] 98 % (02/24 0748) Weight change:  Last BM Date: 09/05/18  Intake/Output from previous day: 02/23 0701 - 02/24 0700 In: 1320 [P.O.:1320] Out: 1775 [Urine:1775]  PHYSICAL EXAM General appearance: alert, cooperative and no distress Resp: rhonchi bilaterally Cardio: regular rate and rhythm, S1, S2 normal, no murmur, click, rub or gallop GI: soft, non-tender; bowel sounds normal; no masses,  no organomegaly Extremities: He still has pitting edema and still has significant swelling of his right foot  Lab Results:  Results for orders placed or performed during the hospital encounter of 08/26/18 (from the past 48 hour(s))  Glucose, capillary     Status: Abnormal   Collection Time: 09/04/18 11:25 AM  Result Value Ref Range   Glucose-Capillary 312 (H) 70 - 99 mg/dL   Comment 1 Notify RN    Comment 2 Document in Chart   Glucose, capillary     Status: Abnormal   Collection Time: 09/04/18  4:40 PM  Result Value Ref Range   Glucose-Capillary 160 (H) 70 - 99 mg/dL   Comment 1 Notify RN    Comment 2 Document in Chart   Glucose, capillary     Status: Abnormal   Collection Time: 09/04/18  8:48 PM  Result Value Ref Range   Glucose-Capillary 242 (H) 70 - 99 mg/dL   Comment 1 Notify RN    Comment 2 Document in Chart   Glucose, capillary     Status: Abnormal   Collection Time: 09/04/18 10:27 PM  Result Value Ref Range   Glucose-Capillary 251 (H) 70 - 99 mg/dL  Glucose, capillary     Status: Abnormal   Collection Time: 09/05/18  7:40 AM  Result Value Ref Range   Glucose-Capillary 247 (H) 70 - 99 mg/dL   Comment 1 Notify RN    Comment 2 Document in Chart   Glucose, capillary     Status: Abnormal   Collection Time: 09/05/18 11:45 AM  Result Value Ref Range   Glucose-Capillary 261 (H) 70 - 99 mg/dL   Comment 1 Notify RN    Comment 2 Document in Chart   Glucose, capillary     Status: Abnormal   Collection Time: 09/05/18  4:09 PM  Result Value Ref Range   Glucose-Capillary 184 (H) 70 - 99 mg/dL   Comment 1 Notify RN    Comment 2 Document in Chart   Glucose, capillary     Status: Abnormal   Collection Time: 09/05/18 10:11 PM  Result Value Ref Range   Glucose-Capillary 260 (H) 70 - 99 mg/dL   Comment 1 Notify RN    Comment 2 Document in Chart   Glucose, capillary     Status: Abnormal   Collection Time: 09/06/18  7:22 AM  Result Value Ref Range   Glucose-Capillary 248 (H) 70 - 99 mg/dL   Comment 1 Notify RN    Comment 2 Document in Chart     ABGS No results for input(s): PHART, PO2ART, TCO2, HCO3 in the last 72 hours.  Invalid input(s): PCO2 CULTURES Recent Results (from the past 240 hour(s))  MRSA  PCR Screening     Status: None   Collection Time: 08/27/18  4:00 PM  Result Value Ref Range Status   MRSA by PCR NEGATIVE NEGATIVE Final    Comment:        The GeneXpert MRSA Assay (FDA approved for NASAL specimens only), is one component of a comprehensive MRSA colonization surveillance program. It is not intended to diagnose MRSA infection nor to guide or monitor treatment for MRSA infections. Performed at [facility], [address] [place], [place] [address]   Urine Culture     Status: Abnormal   Collection Time: 09/02/18  8:50 AM  Result Value Ref Range Status   Specimen Description   Final    URINE, CLEAN CATCH Performed at [facility], [address] [place], [place] [address]    Special Requests   Final    NONE Performed at [facility], [address], [place], [place] [address]    Culture (A)  Final    <10,000 COLONIES/mL INSIGNIFICANT GROWTH Performed at  [facility] [address] [place], [place] [address]    Report Status 09/03/2018 FINAL  Final   Studies/Results: Dg Foot Complete Right  Result Date: 09/05/2018 CLINICAL DATA:  Right foot pain and swelling. EXAM: RIGHT FOOT COMPLETE - 3+ VIEW COMPARISON:  05/01/2015 FINDINGS: Soft tissue swelling noted at the midfoot. No underlying fracture or dislocation evident. No suspicious lytic or sclerotic osseous abnormality. No evidence for radiopaque soft tissue foreign body. IMPRESSION: Soft tissue swelling without acute bony abnormality. Electronically Signed   By: Misty Stanley M.D.   On: 09/05/2018 14:18    Medications:  Prior to Admission:  Medications Prior to Admission  Medication Sig Dispense Refill Last Dose  . albuterol (VENTOLIN HFA) 108 (90 BASE) MCG/ACT inhaler Inhale 2 puffs into the lungs every 6 (six) hours as needed for wheezing or shortness of breath.   08/26/2018  . azithromycin (ZITHROMAX) 500 MG tablet Take 500 mg by mouth daily.   08/26/2018  . baclofen (LIORESAL) 20 MG tablet Take 20 mg by mouth 3 (three) times daily.    08/26/2018  . benzonatate (TESSALON) 200 MG capsule Take 200 mg by mouth 3 (three) times daily.   08/26/2018  . budesonide (PULMICORT) 0.25 MG/2ML nebulizer solution Take 2 mLs (0.25 mg total) by nebulization 2 (two) times daily. 60 mL 12 08/26/2018  . CARTIA XT 120 MG 24 hr capsule Take 1 capsule (120 mg total) by mouth daily. 7 capsule 0 08/26/2018  . diphenhydrAMINE (BENADRYL) 25 mg capsule Take 25 mg by mouth every 8 (eight) hours as needed. Felt mouth swelling at ED visit 08/16/18.   Past Week at Unknown time  . fexofenadine (ALLEGRA) 180 MG tablet Take 180 mg by mouth daily.   Past Week at Unknown time  . fluticasone (FLONASE) 50 MCG/ACT nasal spray Place 2 sprays into both nostrils daily as needed.   5 08/26/2018  . Fluticasone-Umeclidin-Vilant (TRELEGY ELLIPTA) 100-62.5-25 MCG/INH AEPB Inhale 1 puff into the lungs daily.   08/26/2018  .  HYDROcodone-homatropine (HYCODAN) 5-1.5 MG/5ML syrup Take 5 mLs by mouth every 6 (six) hours as needed for cough.   08/26/2018  . insulin glargine (LANTUS) 100 UNIT/ML injection Inject 0.24 mLs (24 Units total) into the skin daily. 24 units daily (Patient taking differently: Inject 32 Units into the skin daily. ) 10 mL 11 08/26/2018  . insulin lispro (HUMALOG) 100 UNIT/ML injection Take by sliding scale provided by hospital maximum amount daily is 60  units.  Check blood sugar 4 times a day (Patient taking differently: Inject 0-15 Units into the skin 3 (three) times daily with meals. Take by sliding scale provided by hospital maximum amount daily is 60 units.  Check blood sugar 4 times a day) 10 mL 11 08/26/2018  . ipratropium (ATROVENT) 0.02 % nebulizer solution Take 0.5 mg by nebulization 2 (two) times daily.   5 08/26/2018  . levalbuterol (XOPENEX) 0.63 MG/3ML nebulizer solution Take 0.63 mg by nebulization 2 (two) times daily.   08/26/2018  . levofloxacin (LEVAQUIN) 500 MG tablet Take 500 mg by mouth daily.   08/26/2018  . LORazepam (ATIVAN) 0.5 MG tablet Take 1 tablet (0.5 mg total) by mouth 2 (two) times daily as needed for anxiety or sleep. (Patient taking differently: Take 0.5 mg by mouth 4 (four) times daily. Pt takes it three times daily.) 20 tablet 0 08/26/2018  . metFORMIN (GLUCOPHAGE) 500 MG tablet Take 1 tablet (500 mg total) by mouth daily with breakfast. 30 tablet 3 08/26/2018  . nitroGLYCERIN (NITROSTAT) 0.4 MG SL tablet Place 1 tablet (0.4 mg total) under the tongue every 5 (five) minutes as needed for chest pain. 25 tablet 4 08/26/2018  . omeprazole (PRILOSEC) 20 MG capsule Take 1 capsule (20 mg total) by mouth 2 (two) times daily before a meal. 180 capsule 3 08/26/2018  . oxyCODONE-acetaminophen (PERCOCET) 10-325 MG tablet Take 1 tablet by mouth every 4 (four) hours as needed for pain.   08/26/2018  . predniSONE (DELTASONE) 10 MG tablet Take 2 daily for 1 week then 1 daily (Patient taking  differently: Take 20 mg by mouth daily with breakfast. Takes 2 tablets daily.) 60 tablet 5 08/26/2018  . rifampin (RIFADIN) 300 MG capsule Take 2 capsules (600 mg total) by mouth daily. 60 capsule 11 08/26/2018  . rosuvastatin (CRESTOR) 10 MG tablet Take 10 mg by mouth every morning.   11 08/26/2018  . predniSONE (DELTASONE) 10 MG tablet Take 2 tablets (20 mg total) by mouth daily. 6 tablet 0 Taking   Scheduled: . azithromycin  500 mg Oral Daily  . baclofen  20 mg Oral TID  . benzonatate  200 mg Oral TID  . chlorhexidine  15 mL Mouth Rinse BID  . diltiazem  120 mg Oral Daily  . doxycycline  100 mg Oral Q12H  . enoxaparin (LOVENOX) injection  40 mg Subcutaneous Q24H  . fluticasone  2 spray Each Nare Daily  . fluticasone furoate-vilanterol  1 puff Inhalation Daily   And  . umeclidinium bromide  1 puff Inhalation Daily  . furosemide  40 mg Intravenous Q12H  . guaiFENesin  1,200 mg Oral BID  . insulin aspart  0-15 Units Subcutaneous TID WC  . insulin aspart  0-5 Units Subcutaneous QHS  . insulin aspart  8 Units Subcutaneous TID WC  . insulin glargine  45 Units Subcutaneous Daily  . ipratropium  0.5 mg Nebulization Q6H WA  . levalbuterol  0.63 mg Nebulization Q6H WA  . mouth rinse  15 mL Mouth Rinse q12n4p  . methylPREDNISolone (SOLU-MEDROL) injection  40 mg Intravenous Q12H  . pantoprazole  40 mg Oral Daily  . potassium chloride  20 mEq Oral BID  . rifampin  600 mg Oral Daily  . rosuvastatin  10 mg Oral q1800   Continuous: . sodium chloride 90 mL/hr at 08/26/18 2220   XTK:WIOXBD chloride, alum & mag hydroxide-simeth, diphenhydrAMINE, HYDROmorphone, LORazepam, oxyCODONE-acetaminophen **AND** oxyCODONE, sodium chloride  Assesment: Healthcare associated pneumonia associated with influenza A.  He had acute on chronic hypoxic respiratory failure and that is better.  He seems to be better as far as the pneumonia is concerned.  He has severe COPD at baseline and is on treatment  He has  acute on chronic diastolic heart failure which frequently occurs when he is in the hospital and is being treated with IV diuresis but he still has significant edema.  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 Principal Problem:   HCAP (healthcare-associated pneumonia) Active Problems:   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  quit smoking 05/01/15    Type 2 diabetes mellitus without complication ([place])   Cavitary lesion of lung   COPD exacerbation (HCC)   Pulmonary fibrosis (HCC)   Chronic respiratory failure with hypoxia ([place])   Acute on chronic diastolic heart failure (HCC)   Acute on chronic respiratory failure with hypoxia ([place])   Influenza due to identified novel influenza A virus with other respiratory manifestations    Plan: Another day of IV diuretics    LOS: 11 days   Alonza Bogus 09/06/2018, 8:38 AM

## 2018-09-06 NOTE — Progress Notes (Signed)
Physical Therapy Treatment Patient Details Name: Ryan [name] MRN: [id number] DOB: [birth year] Today's Date: 09/06/2018    History of Present Illness Ryan [name] is a 58 y.o. male with medical history significant for severe COPD with chronic hypoxic respiratory failure, history of paroxysmal atrial fibrillation not on anticoagulation, insulin-dependent diabetes mellitus, and chronic pain, now presenting to the emergency department for evaluation of increase in his chronic shortness of breath and wheezing, increased productive cough.  Patient reports that his son and wife had been ill recently with respiratory symptoms, patient himself then developed increase in his chronic cough, shortness of breath, and wheezing several days ago.  He has progressively worsened.  He denies any chest pain.  He reports some mild chronic bilateral lower extremity swelling that is unchanged.  He has had a sore throat, but not much rhinorrhea.  He is not sure of his family members were tested for influenza or not.  He called EMS, was found to be saturating in the 80s on his usual 6 L/min supplemental oxygen, treated with Xopenex and 125 mg of IV Solu-Medrol, placed on CPAP, and transported to the ED.    PT Comments    Patient sitting on BSC on entry; agreeable to participating in therapy today. Transfers and ambulation with RW and 8 LPM O2 400 feet with slow steady gait with supervision assistance and cues for upcoming obstacles; patient able to see objects but no details. Change in light intensity makes vision and therefore ambulation more difficult.  Patient would continue to benefit from skilled physical therapy in current environment and next venue to continue return to prior function and increase strength, endurance, balance, coordination, and functional mobility and gait skills.    Follow Up Recommendations  Home health PT;Supervision - Intermittent     Equipment Recommendations  Rolling walker with 5" wheels    Recommendations for Other Services       Precautions / Restrictions Precautions Precautions: Fall Restrictions Weight Bearing Restrictions: No    Mobility  Bed Mobility               General bed mobility comments: pt sitting on BSC at therapist entrance  Transfers Overall transfer level: Modified independent Equipment used: Rolling walker (2 wheeled) Transfers: Sit to/from [facility] Sit to Stand: Supervision Stand pivot transfers: Supervision          Ambulation/Gait Ambulation/Gait assistance: Min guard Gait Distance (Feet): 400 Feet Assistive device: Rolling walker (2 wheeled) Gait Pattern/deviations: Decreased step length - right;Decreased step length - left;Decreased stride length Gait velocity: decreased   General Gait Details: Slow cadence with 8L O2. 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  No c/o SOB through session.     Stairs             Wheelchair Mobility    Modified Rankin (Stroke Patients Only)       Balance Overall balance assessment: Needs assistance Sitting-balance support: Feet supported;No upper extremity supported Sitting balance-Leahy Scale: Good     Standing balance support: No upper extremity supported;During functional activity Standing balance-Leahy Scale: Fair Standing balance comment: fair/good with RW                            Cognition Arousal/Alertness: Awake/alert Behavior During Therapy: WFL for tasks assessed/performed Overall Cognitive Status: Within Functional Limits for tasks assessed  Exercises      General Comments        Pertinent Vitals/Pain Pain Assessment: Faces Faces Pain Scale: Hurts whole lot Pain Location: Rt foot Pain Intervention(s): Limited activity within patient's tolerance;Monitored during session;Premedicated before session    Home Living                       Prior Function            PT Goals (current goals can now be found in the care plan section) Acute Rehab PT Goals Patient Stated Goal: return home Time For Goal Achievement: 09/04/18 Potential to Achieve Goals: Good Progress towards PT goals: Progressing toward goals    Frequency    Min 3X/week      PT Plan Current plan remains appropriate    Co-evaluation              AM-PAC PT "6 Clicks" Mobility   Outcome Measure  Help needed turning from your back to your side while in a flat bed without using bedrails?: None Help needed moving from lying on your back to sitting on the side of a flat bed without using bedrails?: None Help needed moving to and from a bed to a chair (including a wheelchair)?: A Little Help needed standing up from a chair using your arms (e.g., wheelchair or bedside chair)?: A Little Help needed to walk in hospital room?: A Little Help needed climbing 3-5 steps with a railing? : A Lot 6 Click Score: 19    End of Session Equipment Utilized During Treatment: Oxygen;Gait belt Activity Tolerance: Patient tolerated treatment well;Patient limited by fatigue Patient left: in bed;with call bell/phone within reach;with nursing/sitter in room(sitting on EOB wiht call bell within reach) Nurse Communication: Mobility status PT Visit Diagnosis: Unsteadiness on feet (R26.81);Other abnormalities of gait and mobility (R26.89);Muscle weakness (generalized) (M62.81)     Time: 1245-1310 PT Time Calculation (min) (ACUTE ONLY): 25 min  Charges:  $Gait Training: 8-22 mins $Therapeutic Activity: 8-22 mins                     Floria Raveling. Hartnett-Rands, MS, PT Per [place] [phone] 09/06/2018, 1:20 PM

## 2018-09-06 NOTE — Progress Notes (Signed)
Nutrition Brief Note  Patient identified on the length of stay report. He initially presented with HCAP related to influenza A. Hx of COPD, DM-2 and pulmonary fibrosis. Per MD note pt is nearing being ready for discharge.  Wt Readings from Last 15 Encounters:  09/01/18 79.2 kg  08/16/18 78 kg  08/12/18 78.9 kg  08/04/18 79.4 kg  06/20/18 77.8 kg  05/06/18 75.4 kg  04/26/18 75.4 kg  04/20/18 75.6 kg  12/30/17 69.9 kg  11/27/17 66.9 kg  11/17/17 70.4 kg  10/30/17 65.2 kg  10/24/17 67.8 kg  10/04/17 65.9 kg  09/15/17 63.5 kg    Body mass index is 26.55 kg/m. Patient meets criteria for overweight based on current BMI. His weight noted above shows a steady-significant gain of 24% over the past year.  His appetite is good. Current diet order is CHO modified, patient is consuming approximately 100% of meals at this time. Feeds himself.  Labs and medications reviewed.  BMP Latest Ref Rng & Units 09/02/2018 09/02/2018 09/01/2018  Glucose 70 - 99 mg/dL 295(H) - 274(H)  BUN 6 - 20 mg/dL 24(H) - 27(H)  Creatinine 0.61 - 1.24 mg/dL 0.91 0.89 0.83  BUN/Creat Ratio 6 - 22 (calc) - - -  Sodium 135 - 145 mmol/L 133(L) - 134(L)  Potassium 3.5 - 5.1 mmol/L 4.0 - 4.4  Chloride 98 - 111 mmol/L 98 - 99  CO2 22 - 32 mmol/L 25 - 25  Calcium 8.9 - 10.3 mg/dL 8.0(L) - 8.4(L)     No nutrition interventions warranted at this time. If nutrition issues arise, please consult RD.    Colman Cater MS,RD,CSG,LDN Office: [phone] Pager: [phone]

## 2018-09-06 NOTE — Care Management Important Message (Signed)
Important Message  Patient Details  Name: Ryan [name] MRN: [id number] Date of Birth: 01-16-1961   Medicare Important Message Given:  Yes    Jream Broyles, Chauncey Reading, RN 09/06/2018, 2:03 PM

## 2018-09-07 ENCOUNTER — Ambulatory Visit: Payer: Medicare Other | Admitting: Internal Medicine

## 2018-09-07 LAB — GLUCOSE, CAPILLARY
GLUCOSE-CAPILLARY: 208 mg/dL — AB (ref 70–99)
Glucose-Capillary: 82 mg/dL (ref 70–99)
Glucose-Capillary: 113 mg/dL — ABNORMAL HIGH (ref 70–99)
Glucose-Capillary: 329 mg/dL — ABNORMAL HIGH (ref 70–99)

## 2018-09-07 MED ORDER — PREDNISONE 20 MG PO TABS
40.0000 mg | ORAL_TABLET | Freq: Every day | ORAL | Status: DC
  Administered 2018-09-07 – 2018-09-10 (×4): 40 mg via ORAL
  Filled 2018-09-07 (×4): qty 2

## 2018-09-07 NOTE — Progress Notes (Signed)
Subjective: He says he feels a little bit better.  No new complaints.  His breathing is okay.  He still has a lot of swelling of his right more than his left leg and of his foot.  X-rays were negative.  Objective: Vital signs in last 24 hours: Temp:  [97.9 F (36.6 C)-98.8 F (37.1 C)] 97.9 F (36.6 C) (02/25 0558) Pulse Rate:  [75-113] 75 (02/25 0558) Resp:  [18-20] 20 (02/25 0558) BP: (95-124)/(68-86) 95/68 (02/25 0558) SpO2:  [96 %-100 %] 98 % (02/25 0742) Weight change:  Last BM Date: 09/05/18  Intake/Output from previous day: 02/24 0701 - 02/25 0700 In: 1200 [P.O.:1200] Out: 2500 [Urine:2500]  PHYSICAL EXAM General appearance: alert, cooperative and mild distress Resp: rhonchi bilaterally Cardio: regular rate and rhythm, S1, S2 normal, no murmur, click, rub or gallop GI: soft, non-tender; bowel sounds normal; no masses,  no organomegaly Extremities: Still edema as noted  Lab Results:  Results for orders placed or performed during the hospital encounter of 08/26/18 (from the past 48 hour(s))  Glucose, capillary     Status: Abnormal   Collection Time: 09/05/18 11:45 AM  Result Value Ref Range   Glucose-Capillary 261 (H) 70 - 99 mg/dL   Comment 1 Notify RN    Comment 2 Document in Chart   Glucose, capillary     Status: Abnormal   Collection Time: 09/05/18  4:09 PM  Result Value Ref Range   Glucose-Capillary 184 (H) 70 - 99 mg/dL   Comment 1 Notify RN    Comment 2 Document in Chart   Glucose, capillary     Status: Abnormal   Collection Time: 09/05/18 10:11 PM  Result Value Ref Range   Glucose-Capillary 260 (H) 70 - 99 mg/dL   Comment 1 Notify RN    Comment 2 Document in Chart   Glucose, capillary     Status: Abnormal   Collection Time: 09/06/18  7:22 AM  Result Value Ref Range   Glucose-Capillary 248 (H) 70 - 99 mg/dL   Comment 1 Notify RN    Comment 2 Document in Chart   Glucose, capillary     Status: Abnormal   Collection Time: 09/06/18 11:21 AM  Result  Value Ref Range   Glucose-Capillary 338 (H) 70 - 99 mg/dL   Comment 1 Notify RN    Comment 2 Document in Chart   Glucose, capillary     Status: Abnormal   Collection Time: 09/06/18  4:30 PM  Result Value Ref Range   Glucose-Capillary 144 (H) 70 - 99 mg/dL   Comment 1 Notify RN    Comment 2 Document in Chart   Glucose, capillary     Status: Abnormal   Collection Time: 09/06/18 10:20 PM  Result Value Ref Range   Glucose-Capillary 374 (H) 70 - 99 mg/dL  Glucose, capillary     Status: None   Collection Time: 09/07/18  7:21 AM  Result Value Ref Range   Glucose-Capillary 82 70 - 99 mg/dL    ABGS No results for input(s): PHART, PO2ART, TCO2, HCO3 in the last 72 hours.  Invalid input(s): PCO2 CULTURES Recent Results (from the past 240 hour(s))  Urine Culture     Status: Abnormal   Collection Time: 09/02/18  8:50 AM  Result Value Ref Range Status   Specimen Description   Final    URINE, CLEAN CATCH Performed at [facility], [address], [place], [place] [address]    Special Requests   Final  NONE Performed at [facility], [address] [place], [place] [address]    Culture (A)  Final    <10,000 COLONIES/mL INSIGNIFICANT GROWTH Performed at [facility] [address], [place], [place] [address]    Report Status 09/03/2018 FINAL  Final   Studies/Results: Dg Foot Complete Right  Result Date: 09/05/2018 CLINICAL DATA:  Right foot pain and swelling. EXAM: RIGHT FOOT COMPLETE - 3+ VIEW COMPARISON:  05/01/2015 FINDINGS: Soft tissue swelling noted at the midfoot. No underlying fracture or dislocation evident. No suspicious lytic or sclerotic osseous abnormality. No evidence for radiopaque soft tissue foreign body. IMPRESSION: Soft tissue swelling without acute bony abnormality. Electronically Signed   By: Misty Stanley M.D.   On: 09/05/2018 14:18    Medications:  Prior to Admission:  Medications Prior to Admission  Medication Sig Dispense Refill Last Dose  .  albuterol (VENTOLIN HFA) 108 (90 BASE) MCG/ACT inhaler Inhale 2 puffs into the lungs every 6 (six) hours as needed for wheezing or shortness of breath.   08/26/2018  . azithromycin (ZITHROMAX) 500 MG tablet Take 500 mg by mouth daily.   08/26/2018  . baclofen (LIORESAL) 20 MG tablet Take 20 mg by mouth 3 (three) times daily.    08/26/2018  . benzonatate (TESSALON) 200 MG capsule Take 200 mg by mouth 3 (three) times daily.   08/26/2018  . budesonide (PULMICORT) 0.25 MG/2ML nebulizer solution Take 2 mLs (0.25 mg total) by nebulization 2 (two) times daily. 60 mL 12 08/26/2018  . CARTIA XT 120 MG 24 hr capsule Take 1 capsule (120 mg total) by mouth daily. 7 capsule 0 08/26/2018  . diphenhydrAMINE (BENADRYL) 25 mg capsule Take 25 mg by mouth every 8 (eight) hours as needed. Felt mouth swelling at ED visit 08/16/18.   Past Week at Unknown time  . fexofenadine (ALLEGRA) 180 MG tablet Take 180 mg by mouth daily.   Past Week at Unknown time  . fluticasone (FLONASE) 50 MCG/ACT nasal spray Place 2 sprays into both nostrils daily as needed.   5 08/26/2018  . Fluticasone-Umeclidin-Vilant (TRELEGY ELLIPTA) 100-62.5-25 MCG/INH AEPB Inhale 1 puff into the lungs daily.   08/26/2018  . HYDROcodone-homatropine (HYCODAN) 5-1.5 MG/5ML syrup Take 5 mLs by mouth every 6 (six) hours as needed for cough.   08/26/2018  . insulin glargine (LANTUS) 100 UNIT/ML injection Inject 0.24 mLs (24 Units total) into the skin daily. 24 units daily (Patient taking differently: Inject 32 Units into the skin daily. ) 10 mL 11 08/26/2018  . insulin lispro (HUMALOG) 100 UNIT/ML injection Take by sliding scale provided by hospital maximum amount daily is 60 units.  Check blood sugar 4 times a day (Patient taking differently: Inject 0-15 Units into the skin 3 (three) times daily with meals. Take by sliding scale provided by hospital maximum amount daily is 60 units.  Check blood sugar 4 times a day) 10 mL 11 08/26/2018  . ipratropium (ATROVENT) 0.02 %  nebulizer solution Take 0.5 mg by nebulization 2 (two) times daily.   5 08/26/2018  . levalbuterol (XOPENEX) 0.63 MG/3ML nebulizer solution Take 0.63 mg by nebulization 2 (two) times daily.   08/26/2018  . levofloxacin (LEVAQUIN) 500 MG tablet Take 500 mg by mouth daily.   08/26/2018  . LORazepam (ATIVAN) 0.5 MG tablet Take 1 tablet (0.5 mg total) by mouth 2 (two) times daily as needed for anxiety or sleep. (Patient taking differently: Take 0.5 mg by mouth 4 (four) times daily. Pt takes it three times  daily.) 20 tablet 0 08/26/2018  . metFORMIN (GLUCOPHAGE) 500 MG tablet Take 1 tablet (500 mg total) by mouth daily with breakfast. 30 tablet 3 08/26/2018  . nitroGLYCERIN (NITROSTAT) 0.4 MG SL tablet Place 1 tablet (0.4 mg total) under the tongue every 5 (five) minutes as needed for chest pain. 25 tablet 4 08/26/2018  . omeprazole (PRILOSEC) 20 MG capsule Take 1 capsule (20 mg total) by mouth 2 (two) times daily before a meal. 180 capsule 3 08/26/2018  . oxyCODONE-acetaminophen (PERCOCET) 10-325 MG tablet Take 1 tablet by mouth every 4 (four) hours as needed for pain.   08/26/2018  . predniSONE (DELTASONE) 10 MG tablet Take 2 daily for 1 week then 1 daily (Patient taking differently: Take 20 mg by mouth daily with breakfast. Takes 2 tablets daily.) 60 tablet 5 08/26/2018  . rifampin (RIFADIN) 300 MG capsule Take 2 capsules (600 mg total) by mouth daily. 60 capsule 11 08/26/2018  . rosuvastatin (CRESTOR) 10 MG tablet Take 10 mg by mouth every morning.   11 08/26/2018  . predniSONE (DELTASONE) 10 MG tablet Take 2 tablets (20 mg total) by mouth daily. 6 tablet 0 Taking   Scheduled: . azithromycin  500 mg Oral Daily  . baclofen  20 mg Oral TID  . benzonatate  200 mg Oral TID  . chlorhexidine  15 mL Mouth Rinse BID  . diltiazem  120 mg Oral Daily  . doxycycline  100 mg Oral Q12H  . enoxaparin (LOVENOX) injection  40 mg Subcutaneous Q24H  . fluticasone  2 spray Each Nare Daily  . fluticasone furoate-vilanterol  1  puff Inhalation Daily   And  . umeclidinium bromide  1 puff Inhalation Daily  . furosemide  40 mg Intravenous Q12H  . guaiFENesin  1,200 mg Oral BID  . insulin aspart  0-15 Units Subcutaneous TID WC  . insulin aspart  0-5 Units Subcutaneous QHS  . insulin aspart  8 Units Subcutaneous TID WC  . insulin glargine  45 Units Subcutaneous Daily  . ipratropium  0.5 mg Nebulization Q6H WA  . levalbuterol  0.63 mg Nebulization Q6H WA  . mouth rinse  15 mL Mouth Rinse q12n4p  . methylPREDNISolone (SOLU-MEDROL) injection  40 mg Intravenous Q12H  . pantoprazole  40 mg Oral Daily  . potassium chloride  20 mEq Oral BID  . rifampin  600 mg Oral Daily  . rosuvastatin  10 mg Oral q1800   Continuous: . sodium chloride 90 mL/hr at 08/26/18 2220   XMI:WOEHOZ chloride, alum & mag hydroxide-simeth, diphenhydrAMINE, HYDROmorphone, LORazepam, oxyCODONE-acetaminophen **AND** oxyCODONE, sodium chloride  Assesment: He was admitted with healthcare associated pneumonia and influenza A.  He has severe COPD at baseline with chronic hypoxic respiratory failure.  He has acute on chronic diastolic heart failure he is better but is not clear Principal Problem:   HCAP (healthcare-associated pneumonia) Active Problems:   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  quit smoking 05/01/15    Type 2 diabetes mellitus without complication (HCC)   Cavitary lesion of lung   COPD exacerbation (HCC)   Pulmonary fibrosis (HCC)   Chronic respiratory failure with hypoxia (HCC)   Acute on chronic diastolic heart failure (HCC)   Acute on chronic respiratory failure with hypoxia (HCC)   Influenza due to identified novel influenza A virus with other respiratory manifestations    Plan: Another 24 hours of IV Lasix    LOS: 12 days   Alonza Bogus 09/07/2018, 9:06 AM

## 2018-09-07 NOTE — Progress Notes (Signed)
Inpatient Diabetes Program Recommendations  AACE/ADA: New Consensus Statement on Inpatient Glycemic Control   Target Ranges:  Prepandial:   less than 140 mg/dL      Peak postprandial:   less than 180 mg/dL (1-2 hours)      Critically ill patients:  140 - 180 mg/dL   Results for LYNDEN, FLEMMER (MRN [id number]) as of 09/07/2018 07:39  Ref. Range 09/06/2018 07:22 09/06/2018 11:21 09/06/2018 16:30 09/06/2018 22:20 09/07/2018 07:21  Glucose-Capillary Latest Ref Range: 70 - 99 mg/dL 248 (H)  Novolog 13 units  Lantus 45 units 338 (H)  Novolog 19 units 144 (H)  Novolog 10 units 374 (H)  Novolog 5 units @ 00:20 82   Review of Glycemic Control  Current orders for Inpatient glycemic control: Lantus 45 units QHS, Novolog 8 units TID with meals, Novolog 0-15 units TID with meals, Novolog 0-5 units QHS; Solumedrol 40 mg Q12H  Inpatient Diabetes Program Recommendations:   Insulin - Meal Coverage: If Solumedrol is continued as ordered, please consider increasing meal coverage to Novolog 13 units TID with meals.  Thanks, Barnie Alderman, RN, MSN, CDE Diabetes Coordinator Inpatient Diabetes Program [phone] (Team Pager from 8am to 5pm)

## 2018-09-08 LAB — GLUCOSE, CAPILLARY
Glucose-Capillary: 144 mg/dL — ABNORMAL HIGH (ref 70–99)
Glucose-Capillary: 286 mg/dL — ABNORMAL HIGH (ref 70–99)
Glucose-Capillary: 210 mg/dL — ABNORMAL HIGH (ref 70–99)
Glucose-Capillary: 105 mg/dL — ABNORMAL HIGH (ref 70–99)

## 2018-09-08 LAB — BASIC METABOLIC PANEL
Anion gap: 14 (ref 5–15)
BUN: 29 mg/dL — ABNORMAL HIGH (ref 6–20)
CALCIUM: 8.5 mg/dL — AB (ref 8.9–10.3)
CO2: 30 mmol/L (ref 22–32)
Chloride: 91 mmol/L — ABNORMAL LOW (ref 98–111)
Creatinine, Ser: 1.03 mg/dL (ref 0.61–1.24)
GFR calc Af Amer: >60 mL/min (ref >60)
GFR calc non Af Amer: >60 mL/min (ref >60)
GLUCOSE: 224 mg/dL — AB (ref 70–99)
Potassium: 3.1 mmol/L — ABNORMAL LOW (ref 3.5–5.1)
Sodium: 135 mmol/L (ref 135–145)

## 2018-09-08 LAB — URIC ACID: URIC ACID, SERUM: 5 mg/dL (ref 3.7–8.6)

## 2018-09-08 MED ORDER — NITROGLYCERIN 0.4 MG SL SUBL
SUBLINGUAL_TABLET | SUBLINGUAL | Status: AC
  Administered 2018-09-08: 22:00:00
  Filled 2018-09-08: qty 1

## 2018-09-08 MED ORDER — NITROGLYCERIN 0.4 MG SL SUBL
SUBLINGUAL_TABLET | SUBLINGUAL | Status: AC
  Administered 2018-09-08: 23:00:00
  Filled 2018-09-08: qty 1

## 2018-09-08 NOTE — Progress Notes (Signed)
Physical Therapy Treatment Patient Details Name: Ryan [name] MRN: [id number] DOB: 01/01/1961 Today's Date: 09/08/2018    History of Present Illness Ryan [name] is a 58 y.o. male with medical history significant for severe COPD with chronic hypoxic respiratory failure, history of paroxysmal atrial fibrillation not on anticoagulation, insulin-dependent diabetes mellitus, and chronic pain, now presenting to the emergency department for evaluation of increase in his chronic shortness of breath and wheezing, increased productive cough.  Patient reports that his son and wife had been ill recently with respiratory symptoms, patient himself then developed increase in his chronic cough, shortness of breath, and wheezing several days ago.  He has progressively worsened.  He denies any chest pain.  He reports some mild chronic bilateral lower extremity swelling that is unchanged.  He has had a sore throat, but not much rhinorrhea.  He is not sure of his family members were tested for influenza or not.  He called EMS, was found to be saturating in the 80s on his usual 6 L/min supplemental oxygen, treated with Xopenex and 125 mg of IV Solu-Medrol, placed on CPAP, and transported to the ED.    PT Comments    Patient requires less assistance for sit to stands and transfers, presented sitting up on BSC, requested O2 increased to 8 LPM during gait training, demonstrated good tolerance for ambulation with a couple of standing rest breaks due to fatigue, no loss of balance and tolerated sitting up at bedside after therapy.  Patient will benefit from continued physical therapy in hospital and recommended venue below to increase strength, balance, endurance for safe ADLs and gait.   Follow Up Recommendations  Home health PT;Supervision - Intermittent     Equipment Recommendations  Rolling walker with 5" wheels    Recommendations for Other Services       Precautions / Restrictions Precautions Precautions:  Fall Restrictions Weight Bearing Restrictions: No    Mobility  Bed Mobility               General bed mobility comments: presetns seated on BSC  Transfers Overall transfer level: Modified independent   Transfers: Sit to/from Stand;Stand Pivot Transfers Sit to Stand: Modified independent (Device/Increase time) Stand pivot transfers: Modified independent (Device/Increase time)       General transfer comment: increased time  Ambulation/Gait Ambulation/Gait assistance: Supervision Gait Distance (Feet): 200 Feet Assistive device: Rolling walker (2 wheeled) Gait Pattern/deviations: Decreased step length - right;Decreased step length - left;Decreased stride length Gait velocity: decreased   General Gait Details: slightly labored slow cadence without loss of balance, occasional standing rest breaks once fatigued, on 8 LPM O2 with O2 saturation at 97%   Stairs             Wheelchair Mobility    Modified Rankin (Stroke Patients Only)       Balance Overall balance assessment: Needs assistance Sitting-balance support: Feet supported;No upper extremity supported Sitting balance-Leahy Scale: Good     Standing balance support: No upper extremity supported;During functional activity Standing balance-Leahy Scale: Fair Standing balance comment: fair/good with RW                            Cognition Arousal/Alertness: Awake/alert Behavior During Therapy: WFL for tasks assessed/performed Overall Cognitive Status: Within Functional Limits for tasks assessed  Exercises General Exercises - Lower Extremity Long Arc Quad: Seated;AROM;Strengthening;Both;10 reps Hip Flexion/Marching: Seated;AROM;Strengthening;Both;10 reps Toe Raises: Seated;AROM;Strengthening;Both;10 reps Heel Raises: Seated;AROM;Strengthening;Both;10 reps    General Comments        Pertinent Vitals/Pain Pain Assessment:  Faces Faces Pain Scale: Hurts a little bit Pain Location: right foot Pain Descriptors / Indicators: Sore Pain Intervention(s): Limited activity within patient's tolerance;Monitored during session    Home Living                      Prior Function            PT Goals (current goals can now be found in the care plan section) Acute Rehab PT Goals Patient Stated Goal: return home PT Goal Formulation: With patient/family Time For Goal Achievement: 09/15/18 Potential to Achieve Goals: Good Progress towards PT goals: Progressing toward goals    Frequency    Min 3X/week      PT Plan Current plan remains appropriate    Co-evaluation              AM-PAC PT "6 Clicks" Mobility   Outcome Measure  Help needed turning from your back to your side while in a flat bed without using bedrails?: None Help needed moving from lying on your back to sitting on the side of a flat bed without using bedrails?: None Help needed moving to and from a bed to a chair (including a wheelchair)?: None Help needed standing up from a chair using your arms (e.g., wheelchair or bedside chair)?: None Help needed to walk in hospital room?: A Little Help needed climbing 3-5 steps with a railing? : A Lot 6 Click Score: 21    End of Session Equipment Utilized During Treatment: Oxygen Activity Tolerance: Patient tolerated treatment well;Patient limited by fatigue Patient left: in bed;with call bell/phone within reach(seated at bedside) Nurse Communication: Mobility status PT Visit Diagnosis: Unsteadiness on feet (R26.81);Other abnormalities of gait and mobility (R26.89);Muscle weakness (generalized) (M62.81)     Time: 1450-1520 PT Time Calculation (min) (ACUTE ONLY): 30 min  Charges:  $Gait Training: 8-22 mins $Therapeutic Exercise: 8-22 mins                     3:28 PM, 09/08/18 Lonell Grandchild, MPT Physical Therapist with [facility] [phone] office [phone]  mobile phone

## 2018-09-08 NOTE — Progress Notes (Signed)
Subjective: He feels better except for the severe pain in his foot and the swelling in both legs.  He is still short of breath.  His breathing is better than on admission.  Objective: Vital signs in last 24 hours: Temp:  [98.2 F (36.8 C)-98.8 F (37.1 C)] 98.7 F (37.1 C) (02/26 0606) Pulse Rate:  [85-104] 85 (02/26 0606) Resp:  [20] 20 (02/26 0606) BP: (119-141)/(68-78) 119/68 (02/26 0606) SpO2:  [96 %-100 %] 98 % (02/26 0606) Weight change:  Last BM Date: 09/05/18  Intake/Output from previous day: 02/25 0701 - 02/26 0700 In: 960 [P.O.:960] Out: 400 [Urine:400]  PHYSICAL EXAM General appearance: alert, cooperative and mild distress Resp: rhonchi bilaterally Cardio: regular rate and rhythm, S1, S2 normal, no murmur, click, rub or gallop GI: soft, non-tender; bowel sounds normal; no masses,  no organomegaly Extremities: 2+ edema right leg more than left and his foot is still very tender  Lab Results:  Results for orders placed or performed during the hospital encounter of 08/26/18 (from the past 48 hour(s))  Glucose, capillary     Status: Abnormal   Collection Time: 09/06/18 11:21 AM  Result Value Ref Range   Glucose-Capillary 338 (H) 70 - 99 mg/dL   Comment 1 Notify RN    Comment 2 Document in Chart   Glucose, capillary     Status: Abnormal   Collection Time: 09/06/18  4:30 PM  Result Value Ref Range   Glucose-Capillary 144 (H) 70 - 99 mg/dL   Comment 1 Notify RN    Comment 2 Document in Chart   Glucose, capillary     Status: Abnormal   Collection Time: 09/06/18 10:20 PM  Result Value Ref Range   Glucose-Capillary 374 (H) 70 - 99 mg/dL  Glucose, capillary     Status: None   Collection Time: 09/07/18  7:21 AM  Result Value Ref Range   Glucose-Capillary 82 70 - 99 mg/dL  Glucose, capillary     Status: Abnormal   Collection Time: 09/07/18 11:23 AM  Result Value Ref Range   Glucose-Capillary 208 (H) 70 - 99 mg/dL  Glucose, capillary     Status: Abnormal   Collection Time: 09/07/18  4:02 PM  Result Value Ref Range   Glucose-Capillary 113 (H) 70 - 99 mg/dL  Glucose, capillary     Status: Abnormal   Collection Time: 09/07/18 10:37 PM  Result Value Ref Range   Glucose-Capillary 329 (H) 70 - 99 mg/dL  Glucose, capillary     Status: Abnormal   Collection Time: 09/08/18  7:39 AM  Result Value Ref Range   Glucose-Capillary 144 (H) 70 - 99 mg/dL    ABGS No results for input(s): PHART, PO2ART, TCO2, HCO3 in the last 72 hours.  Invalid input(s): PCO2 CULTURES Recent Results (from the past 240 hour(s))  Urine Culture     Status: Abnormal   Collection Time: 09/02/18  8:50 AM  Result Value Ref Range Status   Specimen Description   Final    URINE, CLEAN CATCH Performed at [facility], [address] [place], [place] [address]    Special Requests   Final    NONE Performed at [facility], [address], [place], [place] [address]    Culture (A)  Final    <10,000 COLONIES/mL INSIGNIFICANT GROWTH Performed at [facility], [place] [address] [place], [place] [address]    Report Status 09/03/2018 FINAL  Final   Studies/Results: No results found.  Medications:  Prior to Admission:  Medications Prior to Admission  Medication Sig Dispense Refill Last Dose  . albuterol (VENTOLIN HFA) 108 (90 BASE) MCG/ACT inhaler Inhale 2 puffs into the lungs every 6 (six) hours as needed for wheezing or shortness of breath.   08/26/2018  . azithromycin (ZITHROMAX) 500 MG tablet Take 500 mg by mouth daily.   08/26/2018  . baclofen (LIORESAL) 20 MG tablet Take 20 mg by mouth 3 (three) times daily.    08/26/2018  . benzonatate (TESSALON) 200 MG capsule Take 200 mg by mouth 3 (three) times daily.   08/26/2018  . budesonide (PULMICORT) 0.25 MG/2ML nebulizer solution Take 2 mLs (0.25 mg total) by nebulization 2 (two) times daily. 60 mL 12 08/26/2018  . CARTIA XT 120 MG 24 hr capsule Take 1 capsule (120 mg total) by mouth daily. 7 capsule 0 08/26/2018  .  diphenhydrAMINE (BENADRYL) 25 mg capsule Take 25 mg by mouth every 8 (eight) hours as needed. Felt mouth swelling at ED visit 08/16/18.   Past Week at Unknown time  . fexofenadine (ALLEGRA) 180 MG tablet Take 180 mg by mouth daily.   Past Week at Unknown time  . fluticasone (FLONASE) 50 MCG/ACT nasal spray Place 2 sprays into both nostrils daily as needed.   5 08/26/2018  . Fluticasone-Umeclidin-Vilant (TRELEGY ELLIPTA) 100-62.5-25 MCG/INH AEPB Inhale 1 puff into the lungs daily.   08/26/2018  . HYDROcodone-homatropine (HYCODAN) 5-1.5 MG/5ML syrup Take 5 mLs by mouth every 6 (six) hours as needed for cough.   08/26/2018  . insulin glargine (LANTUS) 100 UNIT/ML injection Inject 0.24 mLs (24 Units total) into the skin daily. 24 units daily (Patient taking differently: Inject 32 Units into the skin daily. ) 10 mL 11 08/26/2018  . insulin lispro (HUMALOG) 100 UNIT/ML injection Take by sliding scale provided by hospital maximum amount daily is 60 units.  Check blood sugar 4 times a day (Patient taking differently: Inject 0-15 Units into the skin 3 (three) times daily with meals. Take by sliding scale provided by hospital maximum amount daily is 60 units.  Check blood sugar 4 times a day) 10 mL 11 08/26/2018  . ipratropium (ATROVENT) 0.02 % nebulizer solution Take 0.5 mg by nebulization 2 (two) times daily.   5 08/26/2018  . levalbuterol (XOPENEX) 0.63 MG/3ML nebulizer solution Take 0.63 mg by nebulization 2 (two) times daily.   08/26/2018  . levofloxacin (LEVAQUIN) 500 MG tablet Take 500 mg by mouth daily.   08/26/2018  . LORazepam (ATIVAN) 0.5 MG tablet Take 1 tablet (0.5 mg total) by mouth 2 (two) times daily as needed for anxiety or sleep. (Patient taking differently: Take 0.5 mg by mouth 4 (four) times daily. Pt takes it three times daily.) 20 tablet 0 08/26/2018  . metFORMIN (GLUCOPHAGE) 500 MG tablet Take 1 tablet (500 mg total) by mouth daily with breakfast. 30 tablet 3 08/26/2018  . nitroGLYCERIN (NITROSTAT) 0.4  MG SL tablet Place 1 tablet (0.4 mg total) under the tongue every 5 (five) minutes as needed for chest pain. 25 tablet 4 08/26/2018  . omeprazole (PRILOSEC) 20 MG capsule Take 1 capsule (20 mg total) by mouth 2 (two) times daily before a meal. 180 capsule 3 08/26/2018  . oxyCODONE-acetaminophen (PERCOCET) 10-325 MG tablet Take 1 tablet by mouth every 4 (four) hours as needed for pain.   08/26/2018  . predniSONE (DELTASONE) 10 MG tablet Take 2 daily for 1 week then 1 daily (Patient taking differently: Take 20 mg by mouth daily with breakfast. Takes 2 tablets daily.)  60 tablet 5 08/26/2018  . rifampin (RIFADIN) 300 MG capsule Take 2 capsules (600 mg total) by mouth daily. 60 capsule 11 08/26/2018  . rosuvastatin (CRESTOR) 10 MG tablet Take 10 mg by mouth every morning.   11 08/26/2018  . predniSONE (DELTASONE) 10 MG tablet Take 2 tablets (20 mg total) by mouth daily. 6 tablet 0 Taking   Scheduled: . azithromycin  500 mg Oral Daily  . baclofen  20 mg Oral TID  . benzonatate  200 mg Oral TID  . chlorhexidine  15 mL Mouth Rinse BID  . diltiazem  120 mg Oral Daily  . doxycycline  100 mg Oral Q12H  . enoxaparin (LOVENOX) injection  40 mg Subcutaneous Q24H  . fluticasone  2 spray Each Nare Daily  . fluticasone furoate-vilanterol  1 puff Inhalation Daily   And  . umeclidinium bromide  1 puff Inhalation Daily  . furosemide  40 mg Intravenous Q12H  . guaiFENesin  1,200 mg Oral BID  . insulin aspart  0-15 Units Subcutaneous TID WC  . insulin aspart  0-5 Units Subcutaneous QHS  . insulin aspart  8 Units Subcutaneous TID WC  . insulin glargine  45 Units Subcutaneous Daily  . ipratropium  0.5 mg Nebulization Q6H WA  . levalbuterol  0.63 mg Nebulization Q6H WA  . mouth rinse  15 mL Mouth Rinse q12n4p  . pantoprazole  40 mg Oral Daily  . potassium chloride  20 mEq Oral BID  . predniSONE  40 mg Oral Q breakfast  . rifampin  600 mg Oral Daily  . rosuvastatin  10 mg Oral q1800   Continuous: . sodium  chloride 90 mL/hr at 08/26/18 2220   XBD:ZHGDJM chloride, alum & mag hydroxide-simeth, diphenhydrAMINE, HYDROmorphone, LORazepam, oxyCODONE-acetaminophen **AND** oxyCODONE, sodium chloride  Assesment: He was admitted with healthcare associated pneumonia with influenza A.  He is better from that.  He has COPD exacerbation and that seems to have generally settled  He has chronic hypoxic respiratory failure and he is back to his baseline oxygen requirement  He has pulmonary mycobacterial disease and is back on his medications  He has acute on chronic diastolic heart failure and still has significant fluid in his legs and has complaints of pain in his right foot.  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  Check labs today including uric acid although I do not think this is gout.    LOS: 13 days   Alonza Bogus 09/08/2018, 7:53 AM

## 2018-09-08 NOTE — Progress Notes (Signed)
Giving PRN Ativan now, Patient states pain is gone.

## 2018-09-08 NOTE — Progress Notes (Addendum)
Patient complains of chest pain, says right arm hurting too. Patient was up on the commode in bed now. Patient heart rate 104, B/P:138/83.  Patient given Nitro will recheck in 5 minutes. Patient no longer having pain

## 2018-09-08 NOTE — Progress Notes (Addendum)
Patient is having anxiety stating wife is mad he is in hospital, none of his family will come up here to be with him. Patient is very anxious. Patient is aggravated. Patient is yelling at wife for not being up here with him.

## 2018-09-08 NOTE — Progress Notes (Addendum)
Patient c/o pain in the middle of chest, second nitro given, 12 lead ordered stat. Patient B/P 115/77, pulse 88.

## 2018-09-09 LAB — BASIC METABOLIC PANEL
Anion gap: 12 (ref 5–15)
BUN: 34 mg/dL — ABNORMAL HIGH (ref 6–20)
CO2: 31 mmol/L (ref 22–32)
Calcium: 8.6 mg/dL — ABNORMAL LOW (ref 8.9–10.3)
Chloride: 91 mmol/L — ABNORMAL LOW (ref 98–111)
Creatinine, Ser: 1.2 mg/dL (ref 0.61–1.24)
GFR calc Af Amer: >60 mL/min (ref >60)
GFR calc non Af Amer: >60 mL/min (ref >60)
Glucose, Bld: 196 mg/dL — ABNORMAL HIGH (ref 70–99)
Potassium: 3.1 mmol/L — ABNORMAL LOW (ref 3.5–5.1)
Sodium: 134 mmol/L — ABNORMAL LOW (ref 135–145)

## 2018-09-09 LAB — GLUCOSE, CAPILLARY
Glucose-Capillary: 177 mg/dL — ABNORMAL HIGH (ref 70–99)
Glucose-Capillary: 288 mg/dL — ABNORMAL HIGH (ref 70–99)
Glucose-Capillary: 191 mg/dL — ABNORMAL HIGH (ref 70–99)
Glucose-Capillary: 142 mg/dL — ABNORMAL HIGH (ref 70–99)

## 2018-09-09 LAB — CREATININE, SERUM
Creatinine, Ser: 1.14 mg/dL (ref 0.61–1.24)
GFR calc Af Amer: >60 mL/min (ref >60)
GFR calc non Af Amer: >60 mL/min (ref >60)

## 2018-09-09 NOTE — Progress Notes (Signed)
Subjective: He had an episode of chest pain last night.  This was in the area of the substernal region but also in his right shoulder.  He is still coughing and coughing up sputum.  He does not feel as short of breath.  The chest pain resolved.  EKG reassuring.  He still has swelling and pain in his right more than his left leg.  TED hose seem to help  Objective: Vital signs in last 24 hours: Temp:  [98.1 F (36.7 C)-99.1 F (37.3 C)] 98.7 F (37.1 C) (02/27 0503) Pulse Rate:  [80-104] 80 (02/27 0503) Resp:  [19-20] 20 (02/27 0503) BP: (103-138)/(66-96) 106/66 (02/27 0503) SpO2:  [95 %-100 %] 98 % (02/27 9147) Weight change:  Last BM Date: 09/07/18  Intake/Output from previous day: 02/26 0701 - 02/27 0700 In: 480 [P.O.:480] Out: 1850 [Urine:1850]  PHYSICAL EXAM General appearance: alert, cooperative and mild distress Resp: rhonchi bilaterally Cardio: regular rate and rhythm, S1, S2 normal, no murmur, click, rub or gallop GI: soft, non-tender; bowel sounds normal; no masses,  no organomegaly Extremities: The edema of his right leg is better but not gone.  The pain in his right foot is better but not gone  Lab Results:  Results for orders placed or performed during the hospital encounter of 08/26/18 (from the past 48 hour(s))  Glucose, capillary     Status: Abnormal   Collection Time: 09/07/18 11:23 AM  Result Value Ref Range   Glucose-Capillary 208 (H) 70 - 99 mg/dL  Glucose, capillary     Status: Abnormal   Collection Time: 09/07/18  4:02 PM  Result Value Ref Range   Glucose-Capillary 113 (H) 70 - 99 mg/dL  Glucose, capillary     Status: Abnormal   Collection Time: 09/07/18 10:37 PM  Result Value Ref Range   Glucose-Capillary 329 (H) 70 - 99 mg/dL  Glucose, capillary     Status: Abnormal   Collection Time: 09/08/18  7:39 AM  Result Value Ref Range   Glucose-Capillary 144 (H) 70 - 99 mg/dL  Basic metabolic panel     Status: Abnormal   Collection Time: 09/08/18  9:17 AM   Result Value Ref Range   Sodium 135 135 - 145 mmol/L   Potassium 3.1 (L) 3.5 - 5.1 mmol/L   Chloride 91 (L) 98 - 111 mmol/L   CO2 30 22 - 32 mmol/L   Glucose, Bld 224 (H) 70 - 99 mg/dL   BUN 29 (H) 6 - 20 mg/dL   Creatinine, Ser 1.03 0.61 - 1.24 mg/dL   Calcium 8.5 (L) 8.9 - 10.3 mg/dL   GFR calc non Af Amer >60 >60 mL/min   GFR calc Af Amer >60 >60 mL/min   Anion gap 14 5 - 15    Comment: Performed at [facility], [address], [place], [place] [address]  Uric acid     Status: None   Collection Time: 09/08/18  9:17 AM  Result Value Ref Range   Uric Acid, Serum 5.0 3.7 - 8.6 mg/dL    Comment: Performed at [facility], [address], [place], [place] [address]  Glucose, capillary     Status: Abnormal   Collection Time: 09/08/18 11:09 AM  Result Value Ref Range   Glucose-Capillary 286 (H) 70 - 99 mg/dL  Glucose, capillary     Status: Abnormal   Collection Time: 09/08/18  4:04 PM  Result Value Ref Range   Glucose-Capillary 210 (H) 70 - 99 mg/dL  Glucose, capillary  Status: Abnormal   Collection Time: 09/08/18 11:11 PM  Result Value Ref Range   Glucose-Capillary 105 (H) 70 - 99 mg/dL  Creatinine, serum     Status: None   Collection Time: 09/09/18  4:30 AM  Result Value Ref Range   Creatinine, Ser 1.14 0.61 - 1.24 mg/dL   GFR calc non Af Amer >60 >60 mL/min   GFR calc Af Amer >60 >60 mL/min    Comment: Performed at [facility], [address], [place], [place] [address]  Basic metabolic panel     Status: Abnormal   Collection Time: 09/09/18  4:30 AM  Result Value Ref Range   Sodium 134 (L) 135 - 145 mmol/L   Potassium 3.1 (L) 3.5 - 5.1 mmol/L   Chloride 91 (L) 98 - 111 mmol/L   CO2 31 22 - 32 mmol/L   Glucose, Bld 196 (H) 70 - 99 mg/dL   BUN 34 (H) 6 - 20 mg/dL   Creatinine, Ser 1.20 0.61 - 1.24 mg/dL   Calcium 8.6 (L) 8.9 - 10.3 mg/dL   GFR calc non Af Amer >60 >60 mL/min   GFR calc Af Amer >60 >60 mL/min   Anion gap 12 5 - 15    Comment: Performed at [facility], [address] [place], [place] [address]  Glucose, capillary     Status: Abnormal   Collection Time: 09/09/18  8:22 AM  Result Value Ref Range   Glucose-Capillary 177 (H) 70 - 99 mg/dL    ABGS No results for input(s): PHART, PO2ART, TCO2, HCO3 in the last 72 hours.  Invalid input(s): PCO2 CULTURES Recent Results (from the past 240 hour(s))  Urine Culture     Status: Abnormal   Collection Time: 09/02/18  8:50 AM  Result Value Ref Range Status   Specimen Description   Final    URINE, CLEAN CATCH Performed at [facility], [address] [place], [place] [address]    Special Requests   Final    NONE Performed at [facility], [address], [place], [place] [address]    Culture (A)  Final    <10,000 COLONIES/mL INSIGNIFICANT GROWTH Performed at [facility], [place] [address] [place], [place] [address]    Report Status 09/03/2018 FINAL  Final   Studies/Results: No results found.  Medications:  Prior to Admission:  Medications Prior to Admission  Medication Sig Dispense Refill Last Dose  . albuterol (VENTOLIN HFA) 108 (90 BASE) MCG/ACT inhaler Inhale 2 puffs into the lungs every 6 (six) hours as needed for wheezing or shortness of breath.   08/26/2018  . azithromycin (ZITHROMAX) 500 MG tablet Take 500 mg by mouth daily.   08/26/2018  . baclofen (LIORESAL) 20 MG tablet Take 20 mg by mouth 3 (three) times daily.    08/26/2018  . benzonatate (TESSALON) 200 MG capsule Take 200 mg by mouth 3 (three) times daily.   08/26/2018  . budesonide (PULMICORT) 0.25 MG/2ML nebulizer solution Take 2 mLs (0.25 mg total) by nebulization 2 (two) times daily. 60 mL 12 08/26/2018  . CARTIA XT 120 MG 24 hr capsule Take 1 capsule (120 mg total) by mouth daily. 7 capsule 0 08/26/2018  . diphenhydrAMINE (BENADRYL) 25 mg capsule Take 25 mg by mouth every 8 (eight) hours as needed. Felt mouth swelling at ED visit 08/16/18.   Past Week at Unknown time  . fexofenadine (ALLEGRA) 180 MG tablet Take 180 mg by  mouth daily.   Past Week at Unknown  time  . fluticasone (FLONASE) 50 MCG/ACT nasal spray Place 2 sprays into both nostrils daily as needed.   5 08/26/2018  . Fluticasone-Umeclidin-Vilant (TRELEGY ELLIPTA) 100-62.5-25 MCG/INH AEPB Inhale 1 puff into the lungs daily.   08/26/2018  . HYDROcodone-homatropine (HYCODAN) 5-1.5 MG/5ML syrup Take 5 mLs by mouth every 6 (six) hours as needed for cough.   08/26/2018  . insulin glargine (LANTUS) 100 UNIT/ML injection Inject 0.24 mLs (24 Units total) into the skin daily. 24 units daily (Patient taking differently: Inject 32 Units into the skin daily. ) 10 mL 11 08/26/2018  . insulin lispro (HUMALOG) 100 UNIT/ML injection Take by sliding scale provided by hospital maximum amount daily is 60 units.  Check blood sugar 4 times a day (Patient taking differently: Inject 0-15 Units into the skin 3 (three) times daily with meals. Take by sliding scale provided by hospital maximum amount daily is 60 units.  Check blood sugar 4 times a day) 10 mL 11 08/26/2018  . ipratropium (ATROVENT) 0.02 % nebulizer solution Take 0.5 mg by nebulization 2 (two) times daily.   5 08/26/2018  . levalbuterol (XOPENEX) 0.63 MG/3ML nebulizer solution Take 0.63 mg by nebulization 2 (two) times daily.   08/26/2018  . levofloxacin (LEVAQUIN) 500 MG tablet Take 500 mg by mouth daily.   08/26/2018  . LORazepam (ATIVAN) 0.5 MG tablet Take 1 tablet (0.5 mg total) by mouth 2 (two) times daily as needed for anxiety or sleep. (Patient taking differently: Take 0.5 mg by mouth 4 (four) times daily. Pt takes it three times daily.) 20 tablet 0 08/26/2018  . metFORMIN (GLUCOPHAGE) 500 MG tablet Take 1 tablet (500 mg total) by mouth daily with breakfast. 30 tablet 3 08/26/2018  . nitroGLYCERIN (NITROSTAT) 0.4 MG SL tablet Place 1 tablet (0.4 mg total) under the tongue every 5 (five) minutes as needed for chest pain. 25 tablet 4 08/26/2018  . omeprazole (PRILOSEC) 20 MG capsule Take 1 capsule (20 mg total) by mouth 2 (two)  times daily before a meal. 180 capsule 3 08/26/2018  . oxyCODONE-acetaminophen (PERCOCET) 10-325 MG tablet Take 1 tablet by mouth every 4 (four) hours as needed for pain.   08/26/2018  . predniSONE (DELTASONE) 10 MG tablet Take 2 daily for 1 week then 1 daily (Patient taking differently: Take 20 mg by mouth daily with breakfast. Takes 2 tablets daily.) 60 tablet 5 08/26/2018  . rifampin (RIFADIN) 300 MG capsule Take 2 capsules (600 mg total) by mouth daily. 60 capsule 11 08/26/2018  . rosuvastatin (CRESTOR) 10 MG tablet Take 10 mg by mouth every morning.   11 08/26/2018  . predniSONE (DELTASONE) 10 MG tablet Take 2 tablets (20 mg total) by mouth daily. 6 tablet 0 Taking   Scheduled: . azithromycin  500 mg Oral Daily  . baclofen  20 mg Oral TID  . benzonatate  200 mg Oral TID  . chlorhexidine  15 mL Mouth Rinse BID  . diltiazem  120 mg Oral Daily  . doxycycline  100 mg Oral Q12H  . enoxaparin (LOVENOX) injection  40 mg Subcutaneous Q24H  . fluticasone  2 spray Each Nare Daily  . fluticasone furoate-vilanterol  1 puff Inhalation Daily   And  . umeclidinium bromide  1 puff Inhalation Daily  . furosemide  40 mg Intravenous Q12H  . guaiFENesin  1,200 mg Oral BID  . insulin aspart  0-15 Units Subcutaneous TID WC  . insulin aspart  0-5 Units Subcutaneous QHS  . insulin aspart  8 Units  Subcutaneous TID WC  . insulin glargine  45 Units Subcutaneous Daily  . ipratropium  0.5 mg Nebulization Q6H WA  . levalbuterol  0.63 mg Nebulization Q6H WA  . mouth rinse  15 mL Mouth Rinse q12n4p  . pantoprazole  40 mg Oral Daily  . potassium chloride  20 mEq Oral BID  . predniSONE  40 mg Oral Q breakfast  . rifampin  600 mg Oral Daily  . rosuvastatin  10 mg Oral q1800   Continuous: . sodium chloride 90 mL/hr at 08/26/18 2220   BSJ:GGEZMO chloride, alum & mag hydroxide-simeth, diphenhydrAMINE, HYDROmorphone, LORazepam, oxyCODONE-acetaminophen **AND** oxyCODONE, sodium chloride  Assesment: He was admitted  with healthcare associated pneumonia related to influenza.  He has finished treatment.  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  He had chest pain last night but it does not appear that he had acute coronary syndrome.  He has had episodes of atrial fib and is not anticoagulated because of interactions between his rifampin and anticoagulants and the fact that he has basically stayed in sinus rhythm for the last year or 2  He has diabetes on insulin and that is doing better. 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  I feel that he would return very quickly.  Continue IV diuresis at least another 24 hours    LOS: 14 days   Alonza Bogus 09/09/2018, 8:43 AM

## 2018-09-10 LAB — GLUCOSE, CAPILLARY
Glucose-Capillary: 174 mg/dL — ABNORMAL HIGH (ref 70–99)
Glucose-Capillary: 246 mg/dL — ABNORMAL HIGH (ref 70–99)

## 2018-09-10 MED ORDER — FUROSEMIDE 40 MG PO TABS: 40.0000 mg | ORAL_TABLET | Freq: Every day | ORAL | 11 refills | Status: DC

## 2018-09-10 MED ORDER — POTASSIUM CHLORIDE CRYS ER 20 MEQ PO TBCR: EXTENDED_RELEASE_TABLET | ORAL | 2 refills | Status: DC

## 2018-09-10 NOTE — Discharge Summary (Signed)
Physician Discharge Summary  Patient ID: Ryan [name] MRN: [id number] DOB/AGE: [birth year] 60 y.o. 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  quit smoking 05/01/15    Type 2 diabetes mellitus without complication (HCC)   Cavitary lesion of lung   COPD exacerbation (HCC)   Pulmonary fibrosis (HCC)   Chronic respiratory failure with hypoxia (HCC)   Acute on chronic diastolic heart failure (HCC)   Acute on chronic respiratory failure with hypoxia (HCC)   Influenza due to identified novel influenza A virus with other respiratory manifestations   Allergies as of 09/10/2018      Reactions   Albuterol Palpitations   Ciprofloxacin Other (See Comments)   Patient experiences vision loss from long-term and short-term use   Influenza Vaccine Live Swelling      Medication List    STOP taking these medications   baclofen 20 MG tablet Commonly known as:  LIORESAL   HYDROcodone-homatropine 5-1.5 MG/5ML syrup Commonly known as:  HYCODAN   levofloxacin 500 MG tablet Commonly known as:  LEVAQUIN     TAKE these medications   azithromycin 500 MG tablet Commonly known as:  ZITHROMAX Take 500 mg by mouth daily.   benzonatate 200 MG capsule Commonly known as:  TESSALON Take 200 mg by mouth 3 (three) times daily.   budesonide 0.25 MG/2ML nebulizer solution Commonly known as:  PULMICORT Take 2 mLs (0.25 mg total) by nebulization 2 (two) times daily.   CARTIA XT 120 MG 24 hr capsule Generic drug:  diltiazem Take 1 capsule (120 mg total) by mouth daily.   diphenhydrAMINE 25 mg capsule Commonly known as:  BENADRYL Take 25 mg by mouth every 8 (eight) hours as needed. Felt mouth swelling at ED visit 08/16/18.   fexofenadine 180 MG tablet Commonly known as:  ALLEGRA Take 180 mg by mouth daily.    fluticasone 50 MCG/ACT nasal spray Commonly known as:  FLONASE Place 2 sprays into both nostrils daily as needed.   furosemide 40 MG tablet Commonly known as:  LASIX Take 1 tablet (40 mg total) by mouth daily. As needed for swelling   insulin glargine 100 UNIT/ML injection Commonly known as:  LANTUS Inject 0.24 mLs (24 Units total) into the skin daily. 24 units daily What changed:    how much to take  additional instructions   insulin lispro 100 UNIT/ML injection Commonly known as:  HUMALOG Take by sliding scale provided by hospital maximum amount daily is 60 units.  Check blood sugar 4 times a day What changed:    how much to take  how to take this  when to take this   ipratropium 0.02 % nebulizer solution Commonly known as:  ATROVENT Take 0.5 mg by nebulization 2 (two) times daily.   levalbuterol 0.63 MG/3ML nebulizer solution Commonly known as:  XOPENEX Take 0.63 mg by nebulization 2 (two) times daily.   LORazepam 0.5 MG tablet Commonly known as:  ATIVAN Take 1 tablet (0.5 mg total) by mouth 2 (two) times daily as needed for anxiety or sleep. What changed:    when to take this  additional instructions   metFORMIN 500 MG tablet Commonly known as:  GLUCOPHAGE Take 1 tablet (500 mg total) by mouth daily with breakfast.   nitroGLYCERIN 0.4 MG SL tablet Commonly known as:  NITROSTAT Place 1 tablet (0.4 mg  total) under the tongue every 5 (five) minutes as needed for chest pain.   omeprazole 20 MG capsule Commonly known as:  PRILOSEC Take 1 capsule (20 mg total) by mouth 2 (two) times daily before a meal.   oxyCODONE-acetaminophen 10-325 MG tablet Commonly known as:  PERCOCET Take 1 tablet by mouth every 4 (four) hours as needed for pain.   potassium chloride SA 20 MEQ tablet Commonly known as:  K-DUR,KLOR-CON Take 1 tablet twice daily while on Lasix   predniSONE 10 MG tablet Commonly known as:  DELTASONE Take 2 daily for 1 week then 1 daily What  changed:    how much to take  how to take this  when to take this  additional instructions  Another medication with the same name was removed. Continue taking this medication, and follow the directions you see here.   rifampin 300 MG capsule Commonly known as:  RIFADIN Take 2 capsules (600 mg total) by mouth daily.   rosuvastatin 10 MG tablet Commonly known as:  CRESTOR Take 10 mg by mouth every morning.   TRELEGY ELLIPTA 100-62.5-25 MCG/INH Aepb Generic drug:  Fluticasone-Umeclidin-Vilant Inhale 1 puff into the lungs daily.   VENTOLIN HFA 108 (90 Base) MCG/ACT inhaler Generic drug:  albuterol Inhale 2 puffs into the lungs every 6 (six) hours as needed for wheezing or shortness of breath.            Durable Medical Equipment  (From admission, onward)         Start     Ordered   09/03/18 0822  For home use only DME Walker rolling  Once    Question:  Patient needs a walker to treat with the following condition  Answer:  Weakness generalized   09/03/18 0109          Discharged Condition: Improved    Consults: None  Significant Diagnostic Studies: Dg Chest 2 View  Result Date: 08/16/2018 CLINICAL DATA:  Cough and congestion for a few days. History of pneumonia. EXAM: CHEST - 2 VIEW COMPARISON:  Chest radiograph August 12, 2018 FINDINGS: Cardiomediastinal silhouette is normal. Calcified aortic arch. Similar LEFT mid lung zone scarring with chronic interstitial changes and apical bulla. Mild hyperinflation. No pleural effusion or focal consolidation. No pneumothorax. Soft tissue planes and included osseous structures are unchanged. IMPRESSION: 1. Stable chronic changes without acute cardiopulmonary process. 2. Emphysema (ICD10-J43.9). 3.  Aortic Atherosclerosis (ICD10-I70.0). Electronically Signed   By: Elon Alas M.D.   On: 08/16/2018 04:55   Dg Chest 2 View  Result Date: 08/12/2018 CLINICAL DATA:  Shortness of breath. EXAM: CHEST - 2 VIEW COMPARISON:  CT  scan June 19, 2018 and chest x-ray from the same day. FINDINGS: The patient has known left-sided cavitary lesions are better assessed on previous CT imaging. Opacity in left mid lung has improved but persists. The heart, hila, mediastinum, and pleura are otherwise unremarkable. Mild opacity in the medial right lung base is chronic. No acute infiltrate. IMPRESSION: No acute interval changes. The patient's known left-sided cavitary lesions are better assessed with CT imaging. Electronically Signed   By: Dorise Bullion III M.D   On: 08/12/2018 13:48   Ct Angio Chest Pe W Or Wo Contrast  Result Date: 09/02/2018 CLINICAL DATA:  Chronic dyspnea, shortness of breath, COPD, on oxygen therapy, difficulty breathing worse supine, worsening breathing since December 2019; history hypertension, type II diabetes mellitus, atrial fibrillation, collagen vascular disease, former smoker EXAM: CT ANGIOGRAPHY CHEST WITH CONTRAST TECHNIQUE: Multidetector CT  imaging of the chest was performed using the standard protocol during bolus administration of intravenous contrast. Multiplanar CT image reconstructions and MIPs were obtained to evaluate the vascular anatomy. CONTRAST:  140mL ISOVUE-370 IOPAMIDOL (ISOVUE-370) INJECTION 76% IV COMPARISON:  06/19/2018, 02/12/2017 FINDINGS: Cardiovascular: Atherosclerotic calcifications of thoracic aorta and coronary arteries. Aorta normal caliber. No aneurysm or dissection. Pulmonary arteries well opacified and patent. No evidence of pulmonary embolism. No pericardial effusion. Mediastinum/Nodes: Esophagus unremarkable. Tiny hiatal hernia. Base of cervical region normal appearance. No thoracic adenopathy. Lungs/Pleura: Severe emphysematous changes with bullet the apices. LEFT upper lobe scarring. Again identified slightly thicker walled cavitary focus at LEFT apex unchanged, with an area of dependent density or nodularity along the posterior aspect unchanged since previous exam as well as an  earlier study from 2018. Additional parenchymal scarring in LEFT lower lobe, likely resolving infected bulla with thinner wall and less fluid/thickening than on previous exam. Minimal dependent RIGHT basilar atelectasis. No acute infiltrate, pleural effusion or pneumothorax. No definite pulmonary mass/nodule. Upper Abdomen: Unremarkable Musculoskeletal: Demineralized, otherwise unremarkable Review of the MIP images confirms the above findings. IMPRESSION: No evidence pulmonary embolism. Scattered atherosclerotic calcifications aorta and coronary arteries. COPD changes with bullous disease at apices and at RIGHT base with scattered areas of parenchymal scarring and atelectasis grossly stable since prior study. Chronic thickening of the walls of a bulla/cavitary focus at the LEFT apex appears unchanged since 2018, recommend attention on follow-up imaging. Aortic Atherosclerosis (ICD10-I70.0) and Emphysema (ICD10-J43.9). Electronically Signed   By: Lavonia Dana M.D.   On: 09/02/2018 12:15   Ct Abdomen Pelvis W Contrast  Result Date: 08/12/2018 CLINICAL DATA:  Left-sided abdominal pain. EXAM: CT ABDOMEN AND PELVIS WITH CONTRAST TECHNIQUE: Multidetector CT imaging of the abdomen and pelvis was performed using the standard protocol following bolus administration of intravenous contrast. CONTRAST:  135mL ISOVUE-300 IOPAMIDOL (ISOVUE-300) INJECTION 61% COMPARISON:  04/22/2018 FINDINGS: Lower chest: Stable scarring at the left lung base. Hepatobiliary: Stable diffuse hepatic steatosis without overt cirrhotic morphology of the liver. No hepatic masses or biliary ductal dilatation. The gallbladder appears unremarkable. Pancreas: Unremarkable. No pancreatic ductal dilatation or surrounding inflammatory changes. Spleen: Normal in size without focal abnormality. Adrenals/Urinary Tract: Adrenal glands are unremarkable. Kidneys are normal, without renal calculi, focal lesion, or hydronephrosis. Bladder is unremarkable.  Stomach/Bowel: Probable small hiatal hernia. No evidence of bowel obstruction, ileus, inflammation or lesion. No free air or focal abscess identified. Vascular/Lymphatic: Calcified plaque of the abdominal aorta and iliac arteries without evidence of aneurysm or significant stenosis. Reproductive: Prostate is unremarkable. Other: No ascites. Umbilical hernia contains a loop of small bowel without evidence of incarceration. Musculoskeletal: No acute or significant osseous findings. IMPRESSION: 1. Stable hepatic steatosis. 2. Umbilical hernia containing a loop of small bowel without evidence of incarceration. 3. Small hiatal hernia. Electronically Signed   By: Aletta Edouard M.D.   On: 08/12/2018 15:18   Dg Chest Port 1 View  Result Date: 08/26/2018 CLINICAL DATA:  Shortness of breath EXAM: PORTABLE CHEST 1 VIEW COMPARISON:  08/16/2018 FINDINGS: Cardiac shadows within normal limits. The lungs are well aerated bilaterally. Emphysematous changes are again seen. Increasing left basilar infiltrate is noted. Superimposed over chronic changes in the bases. No acute bony abnormality is noted. IMPRESSION: Acute left basilar infiltrate superimposed over chronic changes in the bases Electronically Signed   By: Inez Catalina M.D.   On: 08/26/2018 19:50   Dg Foot Complete Right  Result Date: 09/05/2018 CLINICAL DATA:  Right foot pain and swelling. EXAM: RIGHT  FOOT COMPLETE - 3+ VIEW COMPARISON:  05/01/2015 FINDINGS: Soft tissue swelling noted at the midfoot. No underlying fracture or dislocation evident. No suspicious lytic or sclerotic osseous abnormality. No evidence for radiopaque soft tissue foreign body. IMPRESSION: Soft tissue swelling without acute bony abnormality. Electronically Signed   By: Misty Stanley M.D.   On: 09/05/2018 14:18    Lab Results: Basic Metabolic Panel: Recent Labs    09/08/18 0917 09/09/18 0430  NA 135 134*  K 3.1* 3.1*  CL 91* 91*  CO2 30 31  GLUCOSE 224* 196*  BUN 29* 34*   CREATININE 1.03 1.20  1.14  CALCIUM 8.5* 8.6*   Liver Function Tests: No results for input(s): AST, ALT, ALKPHOS, BILITOT, PROT, ALBUMIN in the last 72 hours.   CBC: No results for input(s): WBC, NEUTROABS, HGB, HCT, MCV, PLT in the last 72 hours.  Recent Results (from the past 240 hour(s))  Urine Culture     Status: Abnormal   Collection Time: 09/02/18  8:50 AM  Result Value Ref Range Status   Specimen Description   Final    URINE, CLEAN CATCH Performed at [facility], [address] [place], [place] [address]    Special Requests   Final    NONE Performed at [facility], [address], [place], [place] [address]    Culture (A)  Final    <10,000 COLONIES/mL INSIGNIFICANT GROWTH Performed at [facility] [address], [place], [place] [address]    Report Status 09/03/2018 FINAL  Final     Hospital Course: This is a 58 year old who has severe COPD at baseline and who has chronic hypoxic respiratory failure.  He came to the emergency department because of increasing shortness of breath and he was found to have healthcare associated pneumonia and influenza A.  He was started on treatment and improved slowly.  He had trouble with acute on chronic diastolic heart failure that delayed his discharge but that has improved.  He had some chest pain that delayed his discharge but did not have acute coronary syndrome.  At the time of discharge she was back in baseline with the exception of still having some edema of both legs but this has improved markedly.  He is back at his baseline oxygen level.  Discharge Exam: Blood pressure 93/62, pulse 83, temperature 98.1 F (36.7 C), temperature source Oral, resp. rate 20, height 5\' 8"  (1.727 m), weight 78.3 kg, SpO2 98 %. He is awake and alert.  Chest is clear.  Heart is regular.  He still has 1+ edema of his right leg trace of his left  Disposition: Home with home health services  Discharge Instructions    Face-to-face encounter  (required for Medicare/Medicaid patients)   Complete by:  As directed    I Alonza Bogus certify that this patient is under my care and that I, or a nurse practitioner or physician's assistant working with me, had a face-to-face encounter that meets the physician face-to-face encounter requirements with this patient on 09/10/2018. The encounter with the patient was in whole, or in part for the following medical condition(s) which is the primary reason for home health care (List medical condition): Healthcare associated pneumonia/influenza A   The encounter with the patient was in whole, or in part, for the following medical condition, which is the primary reason for home health care:  Healthcare associated pneumonia/influenza A   I certify that, based on my findings, the following services are medically necessary home  health services:  Nursing   Reason for Medically Necessary Home Health Services:  Skilled Nursing- Change/Decline in Patient Status   My clinical findings support the need for the above services:  Unable to leave home safely without assistance and/or assistive device   Further, I certify that my clinical findings support that this patient is homebound due to:  Unable to leave home safely without assistance   Home Health   Complete by:  As directed    To provide the following care/treatments:  RN   Please do basic metabolic profile on 12/19/2895      Follow-up [facility].   Why:  home health PT Contact information: [address] [place]          Signed: Alonza Bogus   09/10/2018, 7:52 AM

## 2018-09-10 NOTE — Progress Notes (Signed)
Subjective: He says he feels better and wants to go home.  He still has some swelling of his leg but it is much less.  He has not had any more chest pain.  His breathing is pretty good.  Objective: Vital signs in last 24 hours: Temp:  [98 F (36.7 C)-98.1 F (36.7 C)] 98.1 F (36.7 C) (02/28 0623) Pulse Rate:  [83-91] 83 (02/28 0623) Resp:  [20] 20 (02/27 2123) BP: (93-129)/(62-77) 93/62 (02/28 0623) SpO2:  [92 %-99 %] 98 % (02/28 0623) Weight:  [78.3 kg] 78.3 kg (02/28 0623) Weight change:  Last BM Date: 09/07/18  Intake/Output from previous day: 02/27 0701 - 02/28 0700 In: 480 [P.O.:480] Out: 1600 [Urine:1600]  PHYSICAL EXAM General appearance: alert, cooperative and no distress Resp: clear to auscultation bilaterally Cardio: regular rate and rhythm, S1, S2 normal, no murmur, click, rub or gallop GI: soft, non-tender; bowel sounds normal; no masses,  no organomegaly Extremities: extremities normal, atraumatic, no cyanosis or edema  Lab Results:  Results for orders placed or performed during the hospital encounter of 08/26/18 (from the past 48 hour(s))  Basic metabolic panel     Status: Abnormal   Collection Time: 09/08/18  9:17 AM  Result Value Ref Range   Sodium 135 135 - 145 mmol/L   Potassium 3.1 (L) 3.5 - 5.1 mmol/L   Chloride 91 (L) 98 - 111 mmol/L   CO2 30 22 - 32 mmol/L   Glucose, Bld 224 (H) 70 - 99 mg/dL   BUN 29 (H) 6 - 20 mg/dL   Creatinine, Ser 1.03 0.61 - 1.24 mg/dL   Calcium 8.5 (L) 8.9 - 10.3 mg/dL   GFR calc non Af Amer >60 >60 mL/min   GFR calc Af Amer >60 >60 mL/min   Anion gap 14 5 - 15    Comment: Performed at [facility], [address], [place], [place] [address]  Uric acid     Status: None   Collection Time: 09/08/18  9:17 AM  Result Value Ref Range   Uric Acid, Serum 5.0 3.7 - 8.6 mg/dL    Comment: Performed at [facility], [address], [place], [place] [address]  Glucose, capillary     Status: Abnormal   Collection Time: 09/08/18  11:09 AM  Result Value Ref Range   Glucose-Capillary 286 (H) 70 - 99 mg/dL  Glucose, capillary     Status: Abnormal   Collection Time: 09/08/18  4:04 PM  Result Value Ref Range   Glucose-Capillary 210 (H) 70 - 99 mg/dL  Glucose, capillary     Status: Abnormal   Collection Time: 09/08/18 11:11 PM  Result Value Ref Range   Glucose-Capillary 105 (H) 70 - 99 mg/dL  Creatinine, serum     Status: None   Collection Time: 09/09/18  4:30 AM  Result Value Ref Range   Creatinine, Ser 1.14 0.61 - 1.24 mg/dL   GFR calc non Af Amer >60 >60 mL/min   GFR calc Af Amer >60 >60 mL/min    Comment: Performed at [facility], [address] [place], [place] [address]  Basic metabolic panel     Status: Abnormal   Collection Time: 09/09/18  4:30 AM  Result Value Ref Range   Sodium 134 (L) 135 - 145 mmol/L   Potassium 3.1 (L) 3.5 - 5.1 mmol/L   Chloride 91 (L) 98 - 111 mmol/L   CO2 31 22 - 32 mmol/L   Glucose, Bld 196 (H) 70 - 99 mg/dL  BUN 34 (H) 6 - 20 mg/dL   Creatinine, Ser 1.20 0.61 - 1.24 mg/dL   Calcium 8.6 (L) 8.9 - 10.3 mg/dL   GFR calc non Af Amer >60 >60 mL/min   GFR calc Af Amer >60 >60 mL/min   Anion gap 12 5 - 15    Comment: Performed at [facility], [address], [place], [place] [address]  Glucose, capillary     Status: Abnormal   Collection Time: 09/09/18  8:22 AM  Result Value Ref Range   Glucose-Capillary 177 (H) 70 - 99 mg/dL  Glucose, capillary     Status: Abnormal   Collection Time: 09/09/18 11:40 AM  Result Value Ref Range   Glucose-Capillary 288 (H) 70 - 99 mg/dL  Glucose, capillary     Status: Abnormal   Collection Time: 09/09/18  5:00 PM  Result Value Ref Range   Glucose-Capillary 191 (H) 70 - 99 mg/dL  Glucose, capillary     Status: Abnormal   Collection Time: 09/09/18 10:08 PM  Result Value Ref Range   Glucose-Capillary 142 (H) 70 - 99 mg/dL   Comment 1 Notify RN    Comment 2 Document in Chart     ABGS No results for input(s): PHART, PO2ART, TCO2, HCO3 in  the last 72 hours.  Invalid input(s): PCO2 CULTURES Recent Results (from the past 240 hour(s))  Urine Culture     Status: Abnormal   Collection Time: 09/02/18  8:50 AM  Result Value Ref Range Status   Specimen Description   Final    URINE, CLEAN CATCH Performed at [facility], [address], [place], [place] [address]    Special Requests   Final    NONE Performed at [facility], [address] [place], [place] [address]    Culture (A)  Final    <10,000 COLONIES/mL INSIGNIFICANT GROWTH Performed at [facility], [place] [address], [place], [place] [address]    Report Status 09/03/2018 FINAL  Final   Studies/Results: No results found.  Medications:  Prior to Admission:  Medications Prior to Admission  Medication Sig Dispense Refill Last Dose  . albuterol (VENTOLIN HFA) 108 (90 BASE) MCG/ACT inhaler Inhale 2 puffs into the lungs every 6 (six) hours as needed for wheezing or shortness of breath.   08/26/2018  . azithromycin (ZITHROMAX) 500 MG tablet Take 500 mg by mouth daily.   08/26/2018  . baclofen (LIORESAL) 20 MG tablet Take 20 mg by mouth 3 (three) times daily.    08/26/2018  . benzonatate (TESSALON) 200 MG capsule Take 200 mg by mouth 3 (three) times daily.   08/26/2018  . budesonide (PULMICORT) 0.25 MG/2ML nebulizer solution Take 2 mLs (0.25 mg total) by nebulization 2 (two) times daily. 60 mL 12 08/26/2018  . CARTIA XT 120 MG 24 hr capsule Take 1 capsule (120 mg total) by mouth daily. 7 capsule 0 08/26/2018  . diphenhydrAMINE (BENADRYL) 25 mg capsule Take 25 mg by mouth every 8 (eight) hours as needed. Felt mouth swelling at ED visit 08/16/18.   Past Week at Unknown time  . fexofenadine (ALLEGRA) 180 MG tablet Take 180 mg by mouth daily.   Past Week at Unknown time  . fluticasone (FLONASE) 50 MCG/ACT nasal spray Place 2 sprays into both nostrils daily as needed.   5 08/26/2018  . Fluticasone-Umeclidin-Vilant (TRELEGY ELLIPTA) 100-62.5-25 MCG/INH AEPB Inhale 1 puff into the  lungs daily.   08/26/2018  . HYDROcodone-homatropine (HYCODAN) 5-1.5 MG/5ML syrup Take 5 mLs by mouth  every 6 (six) hours as needed for cough.   08/26/2018  . insulin glargine (LANTUS) 100 UNIT/ML injection Inject 0.24 mLs (24 Units total) into the skin daily. 24 units daily (Patient taking differently: Inject 32 Units into the skin daily. ) 10 mL 11 08/26/2018  . insulin lispro (HUMALOG) 100 UNIT/ML injection Take by sliding scale provided by hospital maximum amount daily is 60 units.  Check blood sugar 4 times a day (Patient taking differently: Inject 0-15 Units into the skin 3 (three) times daily with meals. Take by sliding scale provided by hospital maximum amount daily is 60 units.  Check blood sugar 4 times a day) 10 mL 11 08/26/2018  . ipratropium (ATROVENT) 0.02 % nebulizer solution Take 0.5 mg by nebulization 2 (two) times daily.   5 08/26/2018  . levalbuterol (XOPENEX) 0.63 MG/3ML nebulizer solution Take 0.63 mg by nebulization 2 (two) times daily.   08/26/2018  . levofloxacin (LEVAQUIN) 500 MG tablet Take 500 mg by mouth daily.   08/26/2018  . LORazepam (ATIVAN) 0.5 MG tablet Take 1 tablet (0.5 mg total) by mouth 2 (two) times daily as needed for anxiety or sleep. (Patient taking differently: Take 0.5 mg by mouth 4 (four) times daily. Pt takes it three times daily.) 20 tablet 0 08/26/2018  . metFORMIN (GLUCOPHAGE) 500 MG tablet Take 1 tablet (500 mg total) by mouth daily with breakfast. 30 tablet 3 08/26/2018  . nitroGLYCERIN (NITROSTAT) 0.4 MG SL tablet Place 1 tablet (0.4 mg total) under the tongue every 5 (five) minutes as needed for chest pain. 25 tablet 4 08/26/2018  . omeprazole (PRILOSEC) 20 MG capsule Take 1 capsule (20 mg total) by mouth 2 (two) times daily before a meal. 180 capsule 3 08/26/2018  . oxyCODONE-acetaminophen (PERCOCET) 10-325 MG tablet Take 1 tablet by mouth every 4 (four) hours as needed for pain.   08/26/2018  . predniSONE (DELTASONE) 10 MG tablet Take 2 daily for 1 week then 1  daily (Patient taking differently: Take 20 mg by mouth daily with breakfast. Takes 2 tablets daily.) 60 tablet 5 08/26/2018  . rifampin (RIFADIN) 300 MG capsule Take 2 capsules (600 mg total) by mouth daily. 60 capsule 11 08/26/2018  . rosuvastatin (CRESTOR) 10 MG tablet Take 10 mg by mouth every morning.   11 08/26/2018  . predniSONE (DELTASONE) 10 MG tablet Take 2 tablets (20 mg total) by mouth daily. 6 tablet 0 Taking   Scheduled: . azithromycin  500 mg Oral Daily  . baclofen  20 mg Oral TID  . benzonatate  200 mg Oral TID  . chlorhexidine  15 mL Mouth Rinse BID  . diltiazem  120 mg Oral Daily  . doxycycline  100 mg Oral Q12H  . enoxaparin (LOVENOX) injection  40 mg Subcutaneous Q24H  . fluticasone  2 spray Each Nare Daily  . fluticasone furoate-vilanterol  1 puff Inhalation Daily   And  . umeclidinium bromide  1 puff Inhalation Daily  . furosemide  40 mg Intravenous Q12H  . guaiFENesin  1,200 mg Oral BID  . insulin aspart  0-15 Units Subcutaneous TID WC  . insulin aspart  0-5 Units Subcutaneous QHS  . insulin aspart  8 Units Subcutaneous TID WC  . insulin glargine  45 Units Subcutaneous Daily  . ipratropium  0.5 mg Nebulization Q6H WA  . levalbuterol  0.63 mg Nebulization Q6H WA  . mouth rinse  15 mL Mouth Rinse q12n4p  . pantoprazole  40 mg Oral Daily  . potassium chloride  20 mEq Oral BID  . predniSONE  40 mg Oral Q breakfast  . rifampin  600 mg Oral Daily  . rosuvastatin  10 mg Oral q1800   Continuous: . sodium chloride 90 mL/hr at 08/26/18 2220   BEM:LJQGBE chloride, alum & mag hydroxide-simeth, diphenhydrAMINE, HYDROmorphone, LORazepam, oxyCODONE-acetaminophen **AND** oxyCODONE, sodium chloride  Assesment: He was admitted with healthcare associated pneumonia associated with influenza A.  He was slow to respond but is better.  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  He was hypokalemic and his potassium replacement has been increased.  He has diabetes and that is better controlled now. Principal Problem:   HCAP (healthcare-associated pneumonia) Active Problems:   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  quit smoking 05/01/15    Type 2 diabetes mellitus without complication (HCC)   Cavitary lesion of lung   COPD exacerbation (HCC)   Pulmonary fibrosis (HCC)   Chronic respiratory failure with hypoxia (HCC)   Acute on chronic diastolic heart failure (HCC)   Acute on chronic respiratory failure with hypoxia ([place])   Influenza due to identified novel influenza A virus with other respiratory manifestations    Plan: Discharge home today with home health services    LOS: 15 days   Ryan [name] 09/10/2018, 7:40 AM

## 2018-09-10 NOTE — Care Management Note (Signed)
Case Management Note  Patient Details  Name: PACEY ALTIZER MRN: [id number] Date of Birth: 10/14/1960  Subjective/Objective:                    Action/Plan: Discharging home today. Vaughan Basta of [facility] notified. BMET draw by home health on 09/13/18. Asked attending to add PT to orders.   Expected Discharge Date:  09/10/18               Expected Discharge Plan:  [place]  In-House Referral:     Discharge planning Services  CM Consult  Post Acute Care Choice:  Home Health Choice offered to:  Patient  DME Arranged:    DME Agency:     HH Arranged:  PT, RN [place] Agency:  [place]  Status of Service:  Completed, signed off  If discussed at [place] of Stay Meetings, dates discussed:    Additional Comments:  Hudsen Fei, Chauncey [place], RN 09/10/2018, 11:34 AM

## 2018-09-10 NOTE — Care Management Important Message (Signed)
Important Message  Patient Details  Name: Ryan [name] MRN: [id number] Date of Birth: [birth year]   Medicare Important Message Given:  Yes    Jojo Geving, Chauncey [place], RN 09/10/2018, 11:35 AM

## 2018-09-12 DIAGNOSIS — J9621 Acute and chronic respiratory failure with hypoxia: Secondary | ICD-10-CM | POA: Diagnosis not present

## 2018-09-12 DIAGNOSIS — Z9981 Dependence on supplemental oxygen: Secondary | ICD-10-CM | POA: Diagnosis not present

## 2018-09-12 DIAGNOSIS — J441 Chronic obstructive pulmonary disease with (acute) exacerbation: Secondary | ICD-10-CM | POA: Diagnosis not present

## 2018-09-12 DIAGNOSIS — I5033 Acute on chronic diastolic (congestive) heart failure: Secondary | ICD-10-CM | POA: Diagnosis not present

## 2018-09-12 DIAGNOSIS — J8410 Pulmonary fibrosis, unspecified: Secondary | ICD-10-CM | POA: Diagnosis not present

## 2018-09-12 DIAGNOSIS — Z794 Long term (current) use of insulin: Secondary | ICD-10-CM | POA: Diagnosis not present

## 2018-09-12 DIAGNOSIS — Z7951 Long term (current) use of inhaled steroids: Secondary | ICD-10-CM | POA: Diagnosis not present

## 2018-09-12 DIAGNOSIS — Z5181 Encounter for therapeutic drug level monitoring: Secondary | ICD-10-CM | POA: Diagnosis not present

## 2018-09-12 DIAGNOSIS — E119 Type 2 diabetes mellitus without complications: Secondary | ICD-10-CM | POA: Diagnosis not present

## 2018-09-12 DIAGNOSIS — J440 Chronic obstructive pulmonary disease with acute lower respiratory infection: Secondary | ICD-10-CM | POA: Diagnosis not present

## 2018-09-12 DIAGNOSIS — J189 Pneumonia, unspecified organism: Secondary | ICD-10-CM | POA: Diagnosis not present

## 2018-09-13 ENCOUNTER — Other Ambulatory Visit: Payer: Self-pay

## 2018-09-13 DIAGNOSIS — I5020 Unspecified systolic (congestive) heart failure: Secondary | ICD-10-CM | POA: Diagnosis not present

## 2018-09-13 DIAGNOSIS — J189 Pneumonia, unspecified organism: Secondary | ICD-10-CM | POA: Diagnosis not present

## 2018-09-13 DIAGNOSIS — Z5181 Encounter for therapeutic drug level monitoring: Secondary | ICD-10-CM | POA: Diagnosis not present

## 2018-09-13 DIAGNOSIS — J441 Chronic obstructive pulmonary disease with (acute) exacerbation: Secondary | ICD-10-CM | POA: Diagnosis not present

## 2018-09-13 DIAGNOSIS — Z794 Long term (current) use of insulin: Secondary | ICD-10-CM | POA: Diagnosis not present

## 2018-09-13 DIAGNOSIS — J440 Chronic obstructive pulmonary disease with acute lower respiratory infection: Secondary | ICD-10-CM | POA: Diagnosis not present

## 2018-09-13 DIAGNOSIS — Z9981 Dependence on supplemental oxygen: Secondary | ICD-10-CM | POA: Diagnosis not present

## 2018-09-13 DIAGNOSIS — Z7951 Long term (current) use of inhaled steroids: Secondary | ICD-10-CM | POA: Diagnosis not present

## 2018-09-13 DIAGNOSIS — J8410 Pulmonary fibrosis, unspecified: Secondary | ICD-10-CM | POA: Diagnosis not present

## 2018-09-13 DIAGNOSIS — I5033 Acute on chronic diastolic (congestive) heart failure: Secondary | ICD-10-CM | POA: Diagnosis not present

## 2018-09-13 DIAGNOSIS — J9621 Acute and chronic respiratory failure with hypoxia: Secondary | ICD-10-CM | POA: Diagnosis not present

## 2018-09-13 DIAGNOSIS — E119 Type 2 diabetes mellitus without complications: Secondary | ICD-10-CM | POA: Diagnosis not present

## 2018-09-13 NOTE — Patient Outreach (Signed)
[facility]) Care Management  09/13/2018  Ryan [name] 10/18/1960 [id number]    EMMI-General Discharge RED ON EMMI ALERT Day # 1 Date: 09/12/2018 Red Alert Reason: "Scheduled follow up appt? No"   Outreach attempt #1 to patient. No answer. RN CM left HIPAA compliant voicemail message along with contact info.     Plan: RN CM will make outreach attempt to patient within 3-4 business days. RN CM will send unsuccessful outreach letter to patient.  [name], RN,BSN,CCM [place] Management Telephonic Care Management Coordinator Direct Phone: [phone] Toll Free: [phone] Fax: [phone]

## 2018-09-14 ENCOUNTER — Other Ambulatory Visit: Payer: Self-pay

## 2018-09-14 ENCOUNTER — Ambulatory Visit: Payer: Self-pay

## 2018-09-14 NOTE — Patient Outreach (Signed)
[place] [facility]) Care Management  09/14/2018  Javonni Macke [place] 01/07/1961 [id number]      EMMI-General Discharge RED ON EMMI ALERT Day # 1 Date: 09/12/2018 Red Alert Reason: "Scheduled follow up appt? No"    Outreach attempt #3 to patient. No answer at present.     Plan: RN CM will make outreach attempt to patient within 3-4 business days.    [name], RN,BSN,CCM [place] Management Telephonic Care Management Coordinator Direct Phone: [phone] Toll Free: [phone] Fax: [phone]

## 2018-09-14 NOTE — Patient Outreach (Signed)
[place] [facility]) Care Management  [place]   09/14/2018  Ryan [name] 1961/01/08 [id number]  Reason for referral: Medication Reconciliation Post Discharge  Current insurance:[employer]  PMHx includes but not limited to:   ASCVD, atrial fibrillation, asthma, COPD Gold II/III, GERD, type II diabetes mellitus, mycobacterium avium and hyperlipidemia  Outreach:  Successful telephone call with Ryan [name]. HIPAA identifiers verified.   Subjective:  Wife reports that Ryan [name] is still having some swelling in his legs.  She states that he is taking 40 mg of lasix twice daily.  Wife reports that she is going to see Dr. Luan Pulling' nurse this afternoon to discuss his medications and take his CBG log.  She reports his CBGs as 342, 141, 285, 492 on 3/1 and 186, 197, ?, 427 on 3/2. (TIDAC and HS). His morning CBG today was 275 mg/dL.  She reports that his bedtime value is always high.  She states that she will need to reschedule a lot of his doctors's appointments that he missed while hospitalized.  Ryan [name] states that she received a letter saying he was approved for Extra Help LIS.    Objective: Lab Results  Component Value Date   CREATININE 1.14 09/09/2018   CREATININE 1.20 09/09/2018   CREATININE 1.03 09/08/2018    Lab Results  Component Value Date   HGBA1C 8.7 (H) 11/28/2017    Lipid Panel     Component Value Date/Time   CHOL 131 06/23/2011 0555   TRIG 182 (H) 07/26/2015 0440   TRIG 79 05/17/2010 0000   HDL 29 (L) 06/23/2011 0555   CHOLHDL 4.5 06/23/2011 0555   VLDL 17 06/23/2011 0555   LDLCALC 85 06/23/2011 0555   LDLCALC 101 05/17/2010 0000    BP Readings from Last 3 Encounters:  09/10/18 93/62  08/16/18 (!) 139/111  08/12/18 131/83    Allergies  Allergen Reactions  . Albuterol Palpitations  . 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 Influenza Vaccine Live Swelling     Medications Reviewed Today    Reviewed by Dionne Milo, [facility] (Pharmacist) on 09/14/18 at 1428  Med List Status: <None>  Medication Order Taking? Sig Documenting Provider Last Dose Status Informant  albuterol (VENTOLIN HFA) 108 (90 BASE) MCG/ACT inhaler [id number] Yes Inhale 2 puffs into the lungs every 6 (six) hours as needed for wheezing or shortness of breath. [provider] Taking Active Self           Med Note Chuck Hint   Sat Oct 03, 2017 10:23 AM)    azithromycin (ZITHROMAX) 500 MG tablet [id number] Yes Take 500 mg by mouth daily. [provider] Taking Active Self           Med Note Nat Christen   Wed Apr 28, 2018 10:29 AM) Long term medication  benzonatate (TESSALON) 200 MG capsule [id number] Yes Take 200 mg by mouth 3 (three) times daily. [provider] Taking Active Self  budesonide (PULMICORT) 0.25 MG/2ML nebulizer solution [id number] Yes Take 2 mLs (0.25 mg total) by nebulization 2 (two) times daily. [name], MD Taking Active Self  CARTIA XT 120 MG 24 hr capsule [id number] Yes Take 1 capsule (120 mg total) by mouth daily. Arnoldo Lenis, MD Taking Active Self  diphenhydrAMINE (BENADRYL) 25 mg capsule [id number] Yes Take 25 mg by mouth every 8 (eight) hours as needed. Felt mouth swelling at ED visit 08/16/18.  [provider] Taking Active Self  fexofenadine (ALLEGRA) 180 MG tablet [id number] Yes Take 180 mg by mouth daily. [provider] Taking Active Self  fluticasone (FLONASE) 50 MCG/ACT nasal spray [id number] Yes Place 2 sprays into both nostrils daily as needed.  [provider] Taking Active Self  Fluticasone-Umeclidin-Vilant (TRELEGY ELLIPTA) 100-62.5-25 MCG/INH AEPB [id number] Yes Inhale 1 puff into the lungs daily. [provider] Taking Active Self  furosemide (LASIX) 40 MG tablet [id number] Yes Take 1 tablet (40 mg total) by mouth daily. As needed for swelling  Patient taking  differently:  Take 40 mg by mouth daily. As needed for swelling Taking 1 tablet by mouth twice daily.   [name], MD Taking Active   insulin glargine (LANTUS) 100 UNIT/ML injection [id number] Yes Inject 0.24 mLs (24 Units total) into the skin daily. 24 units daily  Patient taking differently:  Inject 40 Units into the skin daily.    [name], MD Taking Active Self  insulin lispro (HUMALOG) 100 UNIT/ML injection [id number] Yes Take by sliding scale provided by hospital maximum amount daily is 60 units.  Check blood sugar 4 times a day  Patient taking differently:  Inject 0-15 Units into the skin 3 (three) times daily with meals. Take by sliding scale provided by hospital maximum amount daily is 60 units.  Check blood sugar 4 times a day   [name], MD Taking Active Self           Med Note Trinidad Curet, [place] Aug 12, 2018  4:41 PM)    ipratropium (ATROVENT) 0.02 % nebulizer solution [id number] Yes Take 0.5 mg by nebulization 2 (two) times daily.  [provider] Taking Active Self           Med Note Nat Christen   Wed Apr 28, 2018 10:35 AM) Used with the levalbuterol  levalbuterol (XOPENEX) 0.63 MG/3ML nebulizer solution [id number] Yes Take 0.63 mg by nebulization 2 (two) times daily. [provider] Taking Active Self           Med Note Nat Christen   Wed Apr 28, 2018 10:36 AM) Used with the ipratropium  LORazepam (ATIVAN) 0.5 MG tablet [id number] Yes Take 1 tablet (0.5 mg total) by mouth 2 (two) times daily as needed for anxiety or sleep.  Patient taking differently:  Take 0.5 mg by mouth 4 (four) times daily. Pt takes it three times daily.   Cristal Ford, DO Taking Active Self  metFORMIN (GLUCOPHAGE) 500 MG tablet [id number] Yes Take 1 tablet (500 mg total) by mouth daily with breakfast. Rexene Alberts, MD Taking Active Self  nitroGLYCERIN (NITROSTAT) 0.4 MG SL tablet [id number] Yes Place 1 tablet (0.4 mg total) under the tongue every 5 (five)  minutes as needed for chest pain. Lendon Colonel, NP Taking Active Self           Med Note Ouida Sills Sep 15, 2017 12:20 PM)    omeprazole (PRILOSEC) 20 MG capsule [id number] Yes Take 1 capsule (20 mg total) by mouth 2 (two) times daily before a meal. Annitta Needs, NP Taking Active Self  oxyCODONE-acetaminophen (PERCOCET) 10-325 MG tablet [id number] Yes Take 1 tablet by mouth every 4 (four) hours as needed for pain. [provider] Taking Active Self  potassium chloride SA (K-DUR,KLOR-CON) 20 MEQ tablet [id number] Yes Take 1 tablet twice daily while on Lasix [name], MD Taking Active   predniSONE (DELTASONE) 10 MG tablet [id number]  Yes Take 2 daily for 1 week then 1 daily  Patient taking differently:  Take 20 mg by mouth daily with breakfast. Takes 2 tablets daily.   [name], MD Taking Active Self  rifampin (RIFADIN) 300 MG capsule [id number] Yes Take 2 capsules (600 mg total) by mouth daily. Thayer Headings, MD Taking Active Self  rosuvastatin (CRESTOR) 10 MG tablet [id number] Yes Take 10 mg by mouth every morning.  [provider] Taking Active Self         ASSESSMENT: Date Discharged from Hospital: 09/10/18 Date Medication Reconciliation Performed: 09/14/2018  Medications:  New at Discharge: . Furosemide . Potassium chloride  Discontinued at Discharge:  . Levofloxacin . Baclofen  Hycodan syrup   Patient was recently discharged from hospital and all medications have been reviewed.  Assessment:  Drugs sorted by system:  Neurologic/Psychologic: lorazepam  Cardiovascular: diltiazem, furosemide, nitroglycerin, potassium chloride, rosuvastatin  Pulmonary/Allergy: albuterol MDI, benzonatate, budesonide neb, diphenhydramine, fexofenadine, fluticasone/umeclidin/vilant, ipratropium, levalbuterol, prednisone  Gastrointestinal: omeprazole  Endocrine: Lantus, Humalog, metformin  Pain: oxycodone/APAP  Infectious Diseases:  azithromycin, rifampin  Medication Review Findings:  . Baclofen- wife reports he is still taking, despite it being discontinued at discharge. . Trelegy refill called into his pharmacy.  Medication Assistance Findings:  No medication assistance needs identified  Extra Help: Patient was approved for Full Extra Help LIS this February 2020.  Test claim verified through his pharmacy.   [x]  Already receiving Full Extra Help  []  Already receiving Partial Extra Help  []  Eligible based on reported income and assets  []  Not Eligible based on reported income and assets  Wife states they have no further medication questions or concerns at this time.  Informed her that I will close his [place] case.   Plan: Route discipline closure letter to PCP, Dr. Luan Pulling.   Joetta Manners, PharmD Clinical Pharmacist [place] [phone]

## 2018-09-15 ENCOUNTER — Ambulatory Visit: Payer: Self-pay

## 2018-09-15 ENCOUNTER — Other Ambulatory Visit: Payer: Self-pay

## 2018-09-15 NOTE — Patient Outreach (Signed)
[facility]) Care Management  09/15/2018  Ryan [name] 02-Mar-1961 [id number]   EMMI-General Discharge RED ON EMMI ALERT Day #1 Date:09/12/2018 Red Alert Reason:"Scheduled follow up appt? No"   Outreach attempt #3 to patient.No answer at present.    Plan: RN CM will close case if no response from letter mailed to patient.    [name], RN,BSN,CCM [place] Management Telephonic Care Management Coordinator Direct Phone: [phone] Toll Free: [phone] Fax: [phone]

## 2018-09-17 ENCOUNTER — Emergency Department (HOSPITAL_COMMUNITY): Payer: Medicare Other

## 2018-09-17 ENCOUNTER — Other Ambulatory Visit: Payer: Self-pay

## 2018-09-17 ENCOUNTER — Encounter (HOSPITAL_COMMUNITY): Payer: Self-pay | Admitting: *Deleted

## 2018-09-17 ENCOUNTER — Observation Stay (HOSPITAL_COMMUNITY)
Admission: EM | Admit: 2018-09-17 | Discharge: 2018-09-19 | Disposition: A | Discharge status: Home / Self Care | Payer: Medicare Other | Attending: Family Medicine | Admitting: Family Medicine

## 2018-09-17 DIAGNOSIS — R319 Hematuria, unspecified: Secondary | ICD-10-CM | POA: Diagnosis not present

## 2018-09-17 DIAGNOSIS — E785 Hyperlipidemia, unspecified: Secondary | ICD-10-CM | POA: Diagnosis not present

## 2018-09-17 DIAGNOSIS — R0602 Shortness of breath: Secondary | ICD-10-CM | POA: Diagnosis not present

## 2018-09-17 DIAGNOSIS — I5033 Acute on chronic diastolic (congestive) heart failure: Secondary | ICD-10-CM | POA: Insufficient documentation

## 2018-09-17 DIAGNOSIS — R109 Unspecified abdominal pain: Secondary | ICD-10-CM | POA: Diagnosis not present

## 2018-09-17 DIAGNOSIS — J441 Chronic obstructive pulmonary disease with (acute) exacerbation: Secondary | ICD-10-CM | POA: Diagnosis not present

## 2018-09-17 DIAGNOSIS — I110 Hypertensive heart disease with heart failure: Secondary | ICD-10-CM | POA: Diagnosis not present

## 2018-09-17 DIAGNOSIS — J181 Lobar pneumonia, unspecified organism: Secondary | ICD-10-CM | POA: Diagnosis not present

## 2018-09-17 DIAGNOSIS — G8929 Other chronic pain: Secondary | ICD-10-CM

## 2018-09-17 DIAGNOSIS — Z79899 Other long term (current) drug therapy: Secondary | ICD-10-CM | POA: Insufficient documentation

## 2018-09-17 DIAGNOSIS — J9621 Acute and chronic respiratory failure with hypoxia: Secondary | ICD-10-CM | POA: Diagnosis not present

## 2018-09-17 DIAGNOSIS — Z794 Long term (current) use of insulin: Secondary | ICD-10-CM | POA: Insufficient documentation

## 2018-09-17 DIAGNOSIS — I480 Paroxysmal atrial fibrillation: Secondary | ICD-10-CM | POA: Diagnosis not present

## 2018-09-17 DIAGNOSIS — R1084 Generalized abdominal pain: Secondary | ICD-10-CM | POA: Insufficient documentation

## 2018-09-17 DIAGNOSIS — I2510 Atherosclerotic heart disease of native coronary artery without angina pectoris: Secondary | ICD-10-CM | POA: Insufficient documentation

## 2018-09-17 DIAGNOSIS — F419 Anxiety disorder, unspecified: Secondary | ICD-10-CM

## 2018-09-17 DIAGNOSIS — I5032 Chronic diastolic (congestive) heart failure: Secondary | ICD-10-CM

## 2018-09-17 DIAGNOSIS — E119 Type 2 diabetes mellitus without complications: Secondary | ICD-10-CM | POA: Diagnosis not present

## 2018-09-17 DIAGNOSIS — R339 Retention of urine, unspecified: Secondary | ICD-10-CM | POA: Insufficient documentation

## 2018-09-17 DIAGNOSIS — R0789 Other chest pain: Secondary | ICD-10-CM | POA: Diagnosis not present

## 2018-09-17 DIAGNOSIS — J9611 Chronic respiratory failure with hypoxia: Secondary | ICD-10-CM | POA: Diagnosis present

## 2018-09-17 HISTORY — DX: Unspecified diastolic (congestive) heart failure: I50.30

## 2018-09-17 HISTORY — DX: Dependence on supplemental oxygen: Z99.81

## 2018-09-17 HISTORY — DX: Chronic diastolic (congestive) heart failure: I50.32

## 2018-09-17 LAB — COMPREHENSIVE METABOLIC PANEL WITH GFR
ALT: 47 U/L — ABNORMAL HIGH (ref 0–44)
AST: 21 U/L (ref 15–41)
Albumin: 3.2 g/dL — ABNORMAL LOW (ref 3.5–5.0)
Alkaline Phosphatase: 55 U/L (ref 38–126)
Anion gap: 11 (ref 5–15)
BUN: 14 mg/dL (ref 6–20)
CO2: 30 mmol/L (ref 22–32)
Calcium: 8.7 mg/dL — ABNORMAL LOW (ref 8.9–10.3)
Chloride: 94 mmol/L — ABNORMAL LOW (ref 98–111)
Creatinine, Ser: 0.92 mg/dL (ref 0.61–1.24)
GFR calc Af Amer: >60 mL/min
GFR calc non Af Amer: >60 mL/min
Glucose, Bld: 280 mg/dL — ABNORMAL HIGH (ref 70–99)
Potassium: 3.9 mmol/L (ref 3.5–5.1)
Sodium: 135 mmol/L (ref 135–145)
Total Bilirubin: 0.3 mg/dL (ref 0.3–1.2)
Total Protein: 7 g/dL (ref 6.5–8.1)

## 2018-09-17 LAB — CBC WITH DIFFERENTIAL/PLATELET
ABS IMMATURE GRANULOCYTES: 0.02 10*3/uL (ref 0.00–0.07)
BASOS PCT: 1 %
Basophils Absolute: 0 10*3/uL (ref 0.0–0.1)
Eosinophils Absolute: 0.5 10*3/uL (ref 0.0–0.5)
Eosinophils Relative: 7 %
HCT: 38 % — ABNORMAL LOW (ref 39.0–52.0)
Hemoglobin: 11.8 g/dL — ABNORMAL LOW (ref 13.0–17.0)
Immature Granulocytes: 0 %
Lymphocytes Relative: 22 %
Lymphs Abs: 1.5 10*3/uL (ref 0.7–4.0)
MCH: 26.8 pg (ref 26.0–34.0)
MCHC: 31.1 g/dL (ref 30.0–36.0)
MCV: 86.2 fL (ref 80.0–100.0)
Monocytes Absolute: 0.7 10*3/uL (ref 0.1–1.0)
Monocytes Relative: 10 %
Neutro Abs: 4 10*3/uL (ref 1.7–7.7)
Neutrophils Relative %: 60 %
Platelets: 142 10*3/uL — ABNORMAL LOW (ref 150–400)
RBC: 4.41 MIL/uL (ref 4.22–5.81)
RDW: 14.4 % (ref 11.5–15.5)
WBC: 6.6 10*3/uL (ref 4.0–10.5)
nRBC: 0 % (ref 0.0–0.2)

## 2018-09-17 LAB — URINALYSIS, ROUTINE W REFLEX MICROSCOPIC
Bacteria, UA: NONE SEEN
Bilirubin Urine: NEGATIVE
Glucose, UA: >=500 mg/dL — AB
Hgb urine dipstick: NEGATIVE
Ketones, ur: NEGATIVE mg/dL
Leukocytes,Ua: NEGATIVE
Nitrite: NEGATIVE
Protein, ur: NEGATIVE mg/dL
Specific Gravity, Urine: 1.021 (ref 1.005–1.030)
pH: 7 (ref 5.0–8.0)

## 2018-09-17 LAB — TROPONIN I: Troponin I: <0.03 ng/mL

## 2018-09-17 LAB — LACTIC ACID, PLASMA
Lactic Acid, Venous: 1.3 mmol/L (ref 0.5–1.9)
Lactic Acid, Venous: 1.4 mmol/L (ref 0.5–1.9)

## 2018-09-17 LAB — LIPASE, BLOOD: Lipase: 17 U/L (ref 11–51)

## 2018-09-17 LAB — GLUCOSE, CAPILLARY
GLUCOSE-CAPILLARY: 357 mg/dL — AB (ref 70–99)
Glucose-Capillary: 281 mg/dL — ABNORMAL HIGH (ref 70–99)

## 2018-09-17 LAB — BRAIN NATRIURETIC PEPTIDE: B Natriuretic Peptide: 24 pg/mL (ref 0.0–100.0)

## 2018-09-17 MED ORDER — OXYCODONE-ACETAMINOPHEN 10-325 MG PO TABS: 1.0000 | ORAL_TABLET | ORAL | Status: DC | PRN

## 2018-09-17 MED ORDER — LEVALBUTEROL HCL 0.63 MG/3ML IN NEBU
0.6300 mg | INHALATION_SOLUTION | Freq: Once | RESPIRATORY_TRACT | Status: AC
  Administered 2018-09-17: 0.63 mg via RESPIRATORY_TRACT
  Filled 2018-09-17: qty 3

## 2018-09-17 MED ORDER — ORAL CARE MOUTH RINSE
15.0000 mL | Freq: Two times a day (BID) | OROMUCOSAL | Status: DC
  Administered 2018-09-18 (×2): 15 mL via OROMUCOSAL

## 2018-09-17 MED ORDER — INSULIN GLARGINE 100 UNIT/ML ~~LOC~~ SOLN
30.0000 [IU] | Freq: Every day | SUBCUTANEOUS | Status: DC
  Administered 2018-09-17 – 2018-09-18 (×2): 30 [IU] via SUBCUTANEOUS
  Filled 2018-09-17 (×3): qty 0.3

## 2018-09-17 MED ORDER — FLUTICASONE-UMECLIDIN-VILANT 100-62.5-25 MCG/INH IN AEPB: 1.0000 | INHALATION_SPRAY | Freq: Every day | RESPIRATORY_TRACT | Status: DC

## 2018-09-17 MED ORDER — INSULIN ASPART 100 UNIT/ML ~~LOC~~ SOLN
0.0000 [IU] | Freq: Three times a day (TID) | SUBCUTANEOUS | Status: DC
  Administered 2018-09-18: 2 [IU] via SUBCUTANEOUS
  Administered 2018-09-18: 5 [IU] via SUBCUTANEOUS
  Administered 2018-09-18 – 2018-09-19 (×2): 11 [IU] via SUBCUTANEOUS
  Administered 2018-09-19: 3 [IU] via SUBCUTANEOUS

## 2018-09-17 MED ORDER — IPRATROPIUM BROMIDE 0.02 % IN SOLN
0.5000 mg | Freq: Once | RESPIRATORY_TRACT | Status: AC
  Administered 2018-09-17: 0.5 mg via RESPIRATORY_TRACT
  Filled 2018-09-17: qty 2.5

## 2018-09-17 MED ORDER — IPRATROPIUM BROMIDE 0.02 % IN SOLN
0.5000 mg | Freq: Two times a day (BID) | RESPIRATORY_TRACT | Status: DC
  Administered 2018-09-17 – 2018-09-19 (×4): 0.5 mg via RESPIRATORY_TRACT
  Filled 2018-09-17 (×4): qty 2.5

## 2018-09-17 MED ORDER — BUDESONIDE 0.25 MG/2ML IN SUSP
0.2500 mg | Freq: Two times a day (BID) | RESPIRATORY_TRACT | Status: DC
  Administered 2018-09-17 – 2018-09-19 (×4): 0.25 mg via RESPIRATORY_TRACT
  Filled 2018-09-17 (×4): qty 2

## 2018-09-17 MED ORDER — PANTOPRAZOLE SODIUM 40 MG PO TBEC
40.0000 mg | DELAYED_RELEASE_TABLET | Freq: Every day | ORAL | Status: DC
  Administered 2018-09-17 – 2018-09-19 (×3): 40 mg via ORAL
  Filled 2018-09-17 (×4): qty 1

## 2018-09-17 MED ORDER — ROSUVASTATIN CALCIUM 10 MG PO TABS
10.0000 mg | ORAL_TABLET | Freq: Every day | ORAL | Status: DC
  Administered 2018-09-18: 10 mg via ORAL
  Filled 2018-09-17: qty 1

## 2018-09-17 MED ORDER — ACETAMINOPHEN 650 MG RE SUPP: 650.0000 mg | Freq: Four times a day (QID) | RECTAL | Status: DC | PRN

## 2018-09-17 MED ORDER — BENZONATATE 100 MG PO CAPS
200.0000 mg | ORAL_CAPSULE | Freq: Three times a day (TID) | ORAL | Status: DC
  Administered 2018-09-17 – 2018-09-19 (×5): 200 mg via ORAL
  Filled 2018-09-17 (×6): qty 2

## 2018-09-17 MED ORDER — AZITHROMYCIN 250 MG PO TABS
500.0000 mg | ORAL_TABLET | Freq: Every day | ORAL | Status: DC
  Administered 2018-09-17 – 2018-09-19 (×3): 500 mg via ORAL
  Filled 2018-09-17 (×4): qty 2

## 2018-09-17 MED ORDER — DILTIAZEM HCL ER COATED BEADS 120 MG PO CP24
120.0000 mg | ORAL_CAPSULE | Freq: Every day | ORAL | Status: DC
  Administered 2018-09-17 – 2018-09-19 (×3): 120 mg via ORAL
  Filled 2018-09-17 (×6): qty 1

## 2018-09-17 MED ORDER — IOHEXOL 350 MG/ML SOLN
100.0000 mL | Freq: Once | INTRAVENOUS | Status: AC | PRN
  Administered 2018-09-17: 100 mL via INTRAVENOUS

## 2018-09-17 MED ORDER — ACETAMINOPHEN 325 MG PO TABS: 650.0000 mg | ORAL_TABLET | Freq: Four times a day (QID) | ORAL | Status: DC | PRN

## 2018-09-17 MED ORDER — INSULIN ASPART 100 UNIT/ML ~~LOC~~ SOLN
0.0000 [IU] | Freq: Every day | SUBCUTANEOUS | Status: DC
  Administered 2018-09-17: 5 [IU] via SUBCUTANEOUS

## 2018-09-17 MED ORDER — FLUTICASONE FUROATE-VILANTEROL 100-25 MCG/INH IN AEPB
1.0000 | INHALATION_SPRAY | Freq: Every day | RESPIRATORY_TRACT | Status: DC
  Administered 2018-09-18 – 2018-09-19 (×2): 1 via RESPIRATORY_TRACT
  Filled 2018-09-17: qty 28

## 2018-09-17 MED ORDER — LEVALBUTEROL HCL 0.63 MG/3ML IN NEBU
0.6300 mg | INHALATION_SOLUTION | Freq: Two times a day (BID) | RESPIRATORY_TRACT | Status: DC
  Administered 2018-09-17 – 2018-09-18 (×2): 0.63 mg via RESPIRATORY_TRACT
  Filled 2018-09-17 (×3): qty 3

## 2018-09-17 MED ORDER — OXYCODONE HCL 5 MG PO TABS
5.0000 mg | ORAL_TABLET | ORAL | Status: DC | PRN
  Administered 2018-09-17 – 2018-09-19 (×9): 5 mg via ORAL
  Filled 2018-09-17 (×9): qty 1

## 2018-09-17 MED ORDER — PREDNISONE 20 MG PO TABS
40.0000 mg | ORAL_TABLET | Freq: Every day | ORAL | Status: DC
  Administered 2018-09-18 – 2018-09-19 (×2): 40 mg via ORAL
  Filled 2018-09-17 (×3): qty 2

## 2018-09-17 MED ORDER — OXYCODONE-ACETAMINOPHEN 5-325 MG PO TABS
1.0000 | ORAL_TABLET | ORAL | Status: DC | PRN
  Administered 2018-09-17 – 2018-09-19 (×9): 1 via ORAL
  Filled 2018-09-17 (×9): qty 1

## 2018-09-17 MED ORDER — METHYLPREDNISOLONE SODIUM SUCC 125 MG IJ SOLR
125.0000 mg | Freq: Once | INTRAMUSCULAR | Status: AC
  Administered 2018-09-17: 125 mg via INTRAVENOUS
  Filled 2018-09-17: qty 2

## 2018-09-17 MED ORDER — ENOXAPARIN SODIUM 40 MG/0.4ML ~~LOC~~ SOLN
40.0000 mg | SUBCUTANEOUS | Status: DC
  Administered 2018-09-17 – 2018-09-18 (×2): 40 mg via SUBCUTANEOUS
  Filled 2018-09-17 (×2): qty 0.4

## 2018-09-17 MED ORDER — LEVALBUTEROL HCL 0.63 MG/3ML IN NEBU
0.6300 mg | INHALATION_SOLUTION | Freq: Four times a day (QID) | RESPIRATORY_TRACT | Status: DC | PRN
  Administered 2018-09-18 – 2018-09-19 (×2): 0.63 mg via RESPIRATORY_TRACT
  Filled 2018-09-17 (×2): qty 3

## 2018-09-17 MED ORDER — LORAZEPAM 0.5 MG PO TABS
0.5000 mg | ORAL_TABLET | Freq: Three times a day (TID) | ORAL | Status: DC | PRN
  Administered 2018-09-17 – 2018-09-19 (×4): 0.5 mg via ORAL
  Filled 2018-09-17 (×4): qty 1

## 2018-09-17 MED ORDER — UMECLIDINIUM BROMIDE 62.5 MCG/INH IN AEPB
1.0000 | INHALATION_SPRAY | Freq: Every day | RESPIRATORY_TRACT | Status: DC
  Administered 2018-09-18 – 2018-09-19 (×2): 1 via RESPIRATORY_TRACT
  Filled 2018-09-17: qty 7

## 2018-09-17 NOTE — ED Notes (Signed)
Pt is distressed due to his being in rm 305  Informed that this is the only bed available and that he might speak to CN and ask for another should one become available   He reports he was is this room for  Weeks and that it is far from the nursing station

## 2018-09-17 NOTE — ED Triage Notes (Addendum)
Pt c/o difficulty breathing, cold chills, lightheadedness, dizziness, congested cough, right side pain with breathing, swelling to bilateral feet x 2 days. Pt was discharged last week due to pneumonia and flu. Denies fever. Pt wears O2 at 6L via [place] continuously.

## 2018-09-17 NOTE — ED Notes (Signed)
Patient states he was dizzy upon standing.

## 2018-09-17 NOTE — H&P (Signed)
History and Physical    Ryan [name] QMG:[id number] DOB: 15-Dec-1960 DOA: 09/17/2018  PCP: [name], MD  Patient coming from: Home  I have personally briefly reviewed patient's old medical records in [place]  Chief Complaint: Shortness of breath, urinary retention  HPI: Ryan [name] is a 58 y.o. male with medical history significant for severe COPD with chronic hypoxic respiratory failure on 6 L O2 via [place] at home at all times, atrial fibrillation not on anticoagulation, chronic diastolic CHF EF 32-67%, insulin-dependent type 2 diabetes, hyperlipidemia, nonobstructive CAD, chronic pain, and anxiety who presents to the ED with several days of shortness of breath, cough, and lightheadedness.  He also reports 3 days of urinary retention.  He reports occasional chills and hot spells.  He denies any chest pain.  Patient was recently admitted from 2/13-2/28/2020 at which time he was treated for influenza A, pneumonia, and acute on chronic diastolic CHF exacerbation.  He was discharged on Lasix 40 mg daily as needed for lower extremity swelling, but says he has been taking this daily.  Despite diuretic use he has not had urine output for 3 days prior to admission.  He denies any previous history of difficulty with urination or dysuria.  ED Course:  Initial vitals in the ED showed BP 115/70, pulse 84, RR 18, temp 97.9 Fahrenheit, SPO2 100% on 6 L O2 via [place].  Labs are notable for serum glucose 280, BUN 14, creatinine 0.92, lipase 17, BNP 24, troponin I<0.03, lactic acid 1.3, WBC 6.6, hemoglobin 11.8, platelets 142, urinalysis negative for UTI.  Portable chest x-ray showed patchy consolidation at the medial right lung base.  CTA chest and abdomen/pelvis with contrast was obtained which was negative for PE.  Severe bullous emphysema with unchanged cavitation in the left upper and lower lobes worse noted.  Markedly distended urinary bladder was seen.  Patient was given Atrovent and Xopenex  breathing treatments, IV Solu-Medrol 125 mg, and a Foley catheter was placed with removal of 1400 cc.  The hospital service was consulted admit for further management.  Review of Systems: As per HPI otherwise 10 point review of systems negative.    Past Medical History:  Diagnosis Date  . Atrial fibrillation ([place])    Not anticoagulated  . Barrett's esophagus   . Borderline diabetes   . Bronchitis   . CAP (community acquired pneumonia) 07/10/2013   04/2015  . 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 Chronic back pain   . Chronic diastolic CHF (congestive heart failure) ([place])   . Chronic left shoulder pain   . Chronic neck pain   . Chronic respiratory failure ([place])   . Collagen vascular disease ([place])   . COPD (chronic obstructive pulmonary disease) ([place])   . Coronary atherosclerosis of native coronary artery    Mild nonobstructive disease at catheterization 2007  . DDD (degenerative disc disease)    Cervical and thoracic  . Diastolic heart failure ([place])   . GERD (gastroesophageal reflux disease)   . History of pneumonia   . Hypercholesteremia   . On home O2    6L N/C  . Polycythemia   . Seizures ([place])    Last seizure 2 yrs ago  . Smoker   . Type 2 diabetes mellitus ([place])   . Type 2 diabetes mellitus without complication [facility])     Past Surgical History:  Procedure Laterality Date  . COLONOSCOPY  2012   Dr. Posey Pronto: normal  .  ESOPHAGOGASTRODUODENOSCOPY N/A 05/04/2015   Dr. Michail Sermon: minimal erosive esophagitis, small hiatal hernia. FOOD PRECLUDED A COMPLETE EXAM. No biopsies taken  . ESOPHAGOGASTRODUODENOSCOPY  2011   [place]: Barrett's   . ESOPHAGOGASTRODUODENOSCOPY (EGD) WITH PROPOFOL N/A 05/06/2018   Dr. Gala Romney: erosive reflux esophagitis and +candida, Schatzki's ring s/p dilation, hiatal hernia.  [name] LUNG BIOPSY    . MALONEY DILATION N/A 05/06/2018   Procedure: Venia Minks DILATION;  Surgeon: Daneil Dolin, MD;  Location: [facility];   Service: Endoscopy;  Laterality: N/A;  . Throat biopsy    . VASECTOMY    . VASECTOMY  1987     reports that he quit smoking about 3 years ago. His smoking use included cigarettes. He has a 22.50 pack-year smoking history. He has never used smokeless tobacco. He reports that he does not drink alcohol or use drugs.  Allergies  Allergen Reactions  . Albuterol Palpitations  . 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 Influenza Vaccine Live Swelling    Family History  Problem Relation Age of Onset  . Hypertension Mother   . Diabetes Mother   . Heart attack Mother   . Heart failure Mother   . Hypertension Sister   . Diabetes Sister   . Heart failure Sister   . Colon cancer Neg Hx      Prior to Admission medications   Medication Sig Start Date End Date Taking? Authorizing Provider  albuterol (VENTOLIN HFA) 108 (90 BASE) MCG/ACT inhaler Inhale 2 puffs into the lungs every 6 (six) hours as needed for wheezing or shortness of breath.   Yes [provider]  azithromycin (ZITHROMAX) 500 MG tablet Take 500 mg by mouth daily.   Yes [provider]  benzonatate (TESSALON) 200 MG capsule Take 200 mg by mouth 3 (three) times daily.   Yes [provider]  budesonide (PULMICORT) 0.25 MG/2ML nebulizer solution Take 2 mLs (0.25 mg total) by nebulization 2 (two) times daily. 10/24/17  Yes [name], MD  CARTIA XT 120 MG 24 hr capsule Take 1 capsule (120 mg total) by mouth daily. 01/01/18  Yes BranchAlphonse Guild, MD  diphenhydrAMINE (BENADRYL) 25 mg capsule Take 25 mg by mouth every 8 (eight) hours as needed. Felt mouth swelling at ED visit 08/16/18.   Yes [provider]  fexofenadine (ALLEGRA) 180 MG tablet Take 180 mg by mouth daily.   Yes [provider]  fluticasone (FLONASE) 50 MCG/ACT nasal spray Place 2 sprays into both nostrils daily as needed.  02/24/18  Yes [provider]   Fluticasone-Umeclidin-Vilant (TRELEGY ELLIPTA) 100-62.5-25 MCG/INH AEPB Inhale 1 puff into the lungs daily.   Yes [provider]  furosemide (LASIX) 40 MG tablet Take 1 tablet (40 mg total) by mouth daily. As needed for swelling Patient taking differently: Take 40 mg by mouth daily. As needed for swelling Taking 1 tablet by mouth twice daily. 09/10/18 09/10/19 Yes [name], MD  insulin glargine (LANTUS) 100 UNIT/ML injection Inject 0.24 mLs (24 Units total) into the skin daily. 24 units daily Patient taking differently: Inject 40 Units into the skin daily.  10/24/17  Yes [name], MD  insulin lispro (HUMALOG) 100 UNIT/ML injection Take by sliding scale provided by hospital maximum amount daily is 60 units.  Check blood sugar 4 times a day Patient taking differently: Inject 0-15 Units into the skin 3 (three) times daily with meals. Take by sliding scale provided by hospital  maximum amount daily is 60 units.  Check blood sugar 4 times a day 09/23/17  Yes [name], MD  ipratropium (ATROVENT) 0.02 % nebulizer solution Take 0.5 mg by nebulization 2 (two) times daily.  09/29/17  Yes [provider]  levalbuterol Penne Lash) 0.63 MG/3ML nebulizer solution Take 0.63 mg by nebulization 2 (two) times daily.   Yes [provider]  LORazepam (ATIVAN) 0.5 MG tablet Take 1 tablet (0.5 mg total) by mouth 2 (two) times daily as needed for anxiety or sleep. Patient taking differently: Take 0.5 mg by mouth 4 (four) times daily. Pt takes it three times daily. 05/04/15  Yes Mikhail, Maryann, DO  magnesium 30 MG tablet Take 30 mg by mouth daily.   Yes [provider]  metFORMIN (GLUCOPHAGE) 500 MG tablet Take 1 tablet (500 mg total) by mouth daily with breakfast. 09/15/14  Yes Rexene Alberts, MD  nitroGLYCERIN (NITROSTAT) 0.4 MG SL tablet Place 1 tablet (0.4 mg total) under the tongue every 5 (five) minutes as needed for chest pain. 04/22/13  Yes Lendon Colonel, NP   omeprazole (PRILOSEC) 20 MG capsule Take 1 capsule (20 mg total) by mouth 2 (two) times daily before a meal. 08/04/18  Yes Annitta Needs, NP  oxyCODONE-acetaminophen (PERCOCET) 10-325 MG tablet Take 1 tablet by mouth every 4 (four) hours as needed for pain.   Yes [provider]  potassium chloride SA (K-DUR,KLOR-CON) 20 MEQ tablet Take 1 tablet twice daily while on Lasix 09/10/18  Yes [name], MD  predniSONE (DELTASONE) 10 MG tablet Take 2 daily for 1 week then 1 daily Patient taking differently: Take 20 mg by mouth daily with breakfast. Takes 2 tablets daily. 10/24/17  Yes [name], MD  rifampin (RIFADIN) 300 MG capsule Take 2 capsules (600 mg total) by mouth daily. 11/17/17  Yes Comer, Okey Regal, MD  rosuvastatin (CRESTOR) 10 MG tablet Take 10 mg by mouth every morning.  04/10/18  Yes [provider]    Physical Exam: Vitals:   09/17/18 1804 09/17/18 1815 09/17/18 2025 09/17/18 2043  BP:   133/85   Pulse:  92 91   Resp:  (!) 26 (!) 21   Temp:   99.2 F (37.3 C)   TempSrc:   Oral   SpO2: 98% 100% 96% 98%  Weight:      Height:        Constitutional: Obese man resting supine in bed, NAD, calm, comfortable Eyes: PERRL, lids and conjunctivae normal ENMT: Mucous membranes are moist. Posterior pharynx clear of any exudate or lesions.Normal dentition.  Neck: normal, supple, no masses. Respiratory: Distant breath sounds, clear to auscultation bilaterally, no wheezing, no crackles. Normal respiratory effort. No accessory muscle use.  Cardiovascular: Regular rate and rhythm, no murmurs / rubs / gallops.  +1 pitting edema both legs.  Abdomen: Mild suprapubic tenderness. No hepatosplenomegaly. Bowel sounds positive.  GU: Foley catheter in place Musculoskeletal: no clubbing / cyanosis. No joint deformity upper and lower extremities. Good ROM, no contractures. Normal muscle tone.  Skin: no rashes, lesions, ulcers. No induration Neurologic: CN 2-12 grossly intact.  Sensation intact, Strength 5/5 in all 4.  Psychiatric: Normal judgment and insight. Alert and oriented x 3. Normal mood.   Labs on Admission: I have personally reviewed following labs and imaging studies  CBC: Recent Labs  Lab 09/17/18 1255  WBC 6.6  NEUTROABS 4.0  HGB 11.8*  HCT 38.0*  MCV 86.2  PLT 629*   Basic Metabolic Panel: Recent Labs  Lab 09/17/18 1255  NA 135  K 3.9  CL 94*  CO2 30  GLUCOSE 280*  BUN 14  CREATININE 0.92  CALCIUM 8.7*   GFR: Estimated Creatinine Clearance: 83.6 mL/min (by C-G formula based on SCr of 0.92 mg/dL). Liver Function Tests: Recent Labs  Lab 09/17/18 1255  AST 21  ALT 47*  ALKPHOS 55  BILITOT 0.3  PROT 7.0  ALBUMIN 3.2*   Recent Labs  Lab 09/17/18 1255  LIPASE 17   No results for input(s): AMMONIA in the last 168 hours. Coagulation Profile: No results for input(s): INR, PROTIME in the last 168 hours. Cardiac Enzymes: Recent Labs  Lab 09/17/18 1255  TROPONINI <0.03   BNP (last 3 results) No results for input(s): PROBNP in the last 8760 hours. HbA1C: No results for input(s): HGBA1C in the last 72 hours. CBG: Recent Labs  Lab 09/17/18 1758 09/17/18 2027  GLUCAP 281* 357*   Lipid Profile: No results for input(s): CHOL, HDL, LDLCALC, TRIG, CHOLHDL, LDLDIRECT in the last 72 hours. Thyroid Function Tests: No results for input(s): TSH, T4TOTAL, FREET4, T3FREE, THYROIDAB in the last 72 hours. Anemia Panel: No results for input(s): VITAMINB12, FOLATE, FERRITIN, TIBC, IRON, RETICCTPCT in the last 72 hours. Urine analysis:    Component Value Date/Time   COLORURINE YELLOW 09/17/2018 1307   APPEARANCEUR CLEAR 09/17/2018 1307   LABSPEC 1.021 09/17/2018 1307   PHURINE 7.0 09/17/2018 1307   GLUCOSEU >=500 (A) 09/17/2018 1307   HGBUR NEGATIVE 09/17/2018 1307   BILIRUBINUR NEGATIVE 09/17/2018 1307   KETONESUR NEGATIVE 09/17/2018 1307   PROTEINUR NEGATIVE 09/17/2018 1307   UROBILINOGEN 0.2 09/08/2014 1444   NITRITE  NEGATIVE 09/17/2018 [place] 09/17/2018 1307    Radiological Exams on Admission: Ct Angio Chest Pe W/cm &/or Wo Cm  Result Date: 09/17/2018 CLINICAL DATA:  Right-sided chest pain with shortness of breath. Lower extremity edema. Abdominal pain. EXAM: CT ANGIOGRAPHY CHEST CT ABDOMEN AND PELVIS WITH CONTRAST TECHNIQUE: Multidetector CT imaging of the chest was performed using the standard protocol during bolus administration of intravenous contrast. Multiplanar CT image reconstructions and MIPs were obtained to evaluate the vascular anatomy. Multidetector CT imaging of the abdomen and pelvis was performed using the standard protocol during bolus administration of intravenous contrast. CONTRAST:  142mL OMNIPAQUE IOHEXOL 350 MG/ML SOLN COMPARISON:  CTA chest 06/19/2018 CT abdomen pelvis 08/12/2018 FINDINGS: CTA CHEST FINDINGS Cardiovascular: Contrast injection is sufficient to demonstrate satisfactory opacification of the pulmonary arteries to the segmental level. There is no pulmonary embolus. The main pulmonary artery is within normal limits for size. There is no CT evidence of acute right heart strain. There is moderate calcific aortic atherosclerosis. No aortic aneurysm or dissection. Proximal arch vessels are widely patent. There is a normal 3-vessel arch branching pattern. Heart size is normal, without pericardial effusion. Mediastinum/Nodes: No mediastinal, hilar or axillary lymphadenopathy. The visualized thyroid and thoracic esophageal course are unremarkable. Lungs/Pleura: There is bullous emphysema of both lungs. Chronic cavitation within the left upper and lower lobes is unchanged. No masses or pulmonary nodules. No pleural effusion or pneumothorax. Bibasilar atelectasis. Musculoskeletal: No chest wall abnormality. No acute or significant osseous findings. Review of the MIP images confirms the above findings. CT ABDOMEN and PELVIS FINDINGS Hepatobiliary: Normal hepatic contours and  density. No visible biliary dilatation. Normal gallbladder. Pancreas: Normal contours without ductal dilatation. No peripancreatic fluid collection. Spleen: Normal. Adrenals/Urinary Tract: --Adrenal glands: Normal. --Right kidney/ureter: No hydronephrosis or perinephric stranding. No nephrolithiasis. No obstructing ureteral stones. --Left  kidney/ureter: No hydronephrosis or perinephric stranding. No nephrolithiasis. No obstructing ureteral stones. --Urinary bladder: Markedly distended. Stomach/Bowel: --Stomach/Duodenum: No hiatal hernia or other gastric abnormality. Normal duodenal course and caliber. --Small bowel: No dilatation or inflammation. --Colon: No focal abnormality. --Appendix: Normal. Vascular/Lymphatic: Atherosclerotic calcification is present within the non-aneurysmal abdominal aorta, without hemodynamically significant stenosis. No abdominal or pelvic lymphadenopathy. Reproductive: Normal prostate and seminal vesicles. Musculoskeletal. No bony spinal canal stenosis or focal osseous abnormality. Other: None. IMPRESSION: 1. No pulmonary embolus or acute aortic syndrome. 2. Severe bullous emphysema with unchanged areas of cavitation in the left upper and lower lobes. 3. No acute abnormality of the abdomen or pelvis. 4. Markedly distended urinary bladder. Correlate for signs of urinary retention. 5. Aortic Atherosclerosis (ICD10-I70.0) and Emphysema (ICD10-J43.9). Electronically Signed   By: Ulyses Jarred M.D.   On: 09/17/2018 14:45   Ct Abdomen Pelvis W Contrast  Result Date: 09/17/2018 CLINICAL DATA:  Right-sided chest pain with shortness of breath. Lower extremity edema. Abdominal pain. EXAM: CT ANGIOGRAPHY CHEST CT ABDOMEN AND PELVIS WITH CONTRAST TECHNIQUE: Multidetector CT imaging of the chest was performed using the standard protocol during bolus administration of intravenous contrast. Multiplanar CT image reconstructions and MIPs were obtained to evaluate the vascular anatomy. Multidetector CT  imaging of the abdomen and pelvis was performed using the standard protocol during bolus administration of intravenous contrast. CONTRAST:  123mL OMNIPAQUE IOHEXOL 350 MG/ML SOLN COMPARISON:  CTA chest 06/19/2018 CT abdomen pelvis 08/12/2018 FINDINGS: CTA CHEST FINDINGS Cardiovascular: Contrast injection is sufficient to demonstrate satisfactory opacification of the pulmonary arteries to the segmental level. There is no pulmonary embolus. The main pulmonary artery is within normal limits for size. There is no CT evidence of acute right heart strain. There is moderate calcific aortic atherosclerosis. No aortic aneurysm or dissection. Proximal arch vessels are widely patent. There is a normal 3-vessel arch branching pattern. Heart size is normal, without pericardial effusion. Mediastinum/Nodes: No mediastinal, hilar or axillary lymphadenopathy. The visualized thyroid and thoracic esophageal course are unremarkable. Lungs/Pleura: There is bullous emphysema of both lungs. Chronic cavitation within the left upper and lower lobes is unchanged. No masses or pulmonary nodules. No pleural effusion or pneumothorax. Bibasilar atelectasis. Musculoskeletal: No chest wall abnormality. No acute or significant osseous findings. Review of the MIP images confirms the above findings. CT ABDOMEN and PELVIS FINDINGS Hepatobiliary: Normal hepatic contours and density. No visible biliary dilatation. Normal gallbladder. Pancreas: Normal contours without ductal dilatation. No peripancreatic fluid collection. Spleen: Normal. Adrenals/Urinary Tract: --Adrenal glands: Normal. --Right kidney/ureter: No hydronephrosis or perinephric stranding. No nephrolithiasis. No obstructing ureteral stones. --Left kidney/ureter: No hydronephrosis or perinephric stranding. No nephrolithiasis. No obstructing ureteral stones. --Urinary bladder: Markedly distended. Stomach/Bowel: --Stomach/Duodenum: No hiatal hernia or other gastric abnormality. Normal duodenal  course and caliber. --Small bowel: No dilatation or inflammation. --Colon: No focal abnormality. --Appendix: Normal. Vascular/Lymphatic: Atherosclerotic calcification is present within the non-aneurysmal abdominal aorta, without hemodynamically significant stenosis. No abdominal or pelvic lymphadenopathy. Reproductive: Normal prostate and seminal vesicles. Musculoskeletal. No bony spinal canal stenosis or focal osseous abnormality. Other: None. IMPRESSION: 1. No pulmonary embolus or acute aortic syndrome. 2. Severe bullous emphysema with unchanged areas of cavitation in the left upper and lower lobes. 3. No acute abnormality of the abdomen or pelvis. 4. Markedly distended urinary bladder. Correlate for signs of urinary retention. 5. Aortic Atherosclerosis (ICD10-I70.0) and Emphysema (ICD10-J43.9). Electronically Signed   By: Ulyses Jarred M.D.   On: 09/17/2018 14:45   Dg Chest Port 1 View  Result Date:  09/17/2018 CLINICAL DATA:  Difficulty breathing and cough EXAM: PORTABLE CHEST 1 VIEW COMPARISON:  August 26, 2018 FINDINGS: The heart size and mediastinal contours are within normal limits. Patchy consolidation of right lung base is identified. There is no pulmonary edema or pleural effusion. The visualized skeletal structures are stable. IMPRESSION: Patchy consolidation of medial right lung base suspicious for pneumonia. Electronically Signed   By: Abelardo Diesel M.D.   On: 09/17/2018 13:41    EKG: Independently reviewed. Sinus rhythm, early R wave transition.  Assessment/Plan Principal Problem:   COPD exacerbation (HCC) Active Problems:   Hyperlipidemia   Type 2 diabetes mellitus without complication (HCC)   PAF (paroxysmal atrial fibrillation) (HCC)   Chronic respiratory failure with hypoxia (HCC)   Chronic diastolic CHF (congestive heart failure) (HCC)   Urinary retention   Chronic pain   Anxiety  Yoan Sallade Clay is a 58 y.o. male with medical history significant for severe COPD with chronic  hypoxic respiratory failure on 6 L O2 via [place] at home at all times, atrial fibrillation not on anticoagulation, chronic diastolic CHF EF 28-63%, insulin-dependent type 2 diabetes, hyperlipidemia, nonobstructive CAD, chronic pain, and anxiety who is admitted with COPD exacerbation and acute urinary retention.  COPD exacerbation/chronic respiratory failure with hypoxia: Improving after initial treatment.  No obvious pneumonia. -Continue bronchodilators and azithromycin -Transition to oral prednisone 40 mg daily -Continue supplemental oxygen  Acute urinary retention: Significant bladder distention noted on CT, Foley catheter placed in the ED with 1.4 L output.  Renal function remains intact without hydronephrosis, perinephric stranding, nephrolithiasis, or obstructing ureteral stones seen on CT imaging.  Potential cause includes medication side effect from chronic opioid use for chronic pain, Benadryl. -Continue Foley care -Continue chronic pain medication for now  Insulin-dependent type 2 diabetes: -Reduced home Lantus 30 units nightly, SSI while inpatient  Paroxysmal atrial fibrillation: In sinus rhythm on admission.  He is not on anticoagulation. -Continue to monitor  Chronic diastolic CHF: EF 81-77% with grade 1 diastolic dysfunction by echocardiogram 10/18/2017.  Has slight peripheral edema without obvious exacerbation of CHF. -Holding Lasix with acute urinary retention, may be able to resume tomorrow -Strict I/O's  Hyperlipidemia: -Continue Crestor  Anxiety: -Continue home Ativan  Chronic pain: -Continue home Percocet  DVT prophylaxis: Lovenox Code Status: Full code, confirmed the patient Family Communication: None present at bedside on admission Disposition Plan: Likely discharge home pending clinical progress Consults called: None Admission status: Observation   Zada Finders MD [facility] Pager [phone]  If 7PM-7AM, please contact  night-coverage [email or web address]  09/17/2018, 11:18 PM

## 2018-09-17 NOTE — ED Notes (Signed)
Patient unable to ambulate in hallway.  Patient became very dizzy and unstable on feet just moving from bed to bedside commode.

## 2018-09-17 NOTE — ED Notes (Signed)
Pt released last week after several week stay here for pneumonia  Home with increased shortness of breath   Per caregiver coughing up blood earlier in the week No call toPCP

## 2018-09-17 NOTE — ED Notes (Signed)
Bladder scanner indicates "greater than 535 fl"

## 2018-09-17 NOTE — ED Notes (Signed)
Report to Janelle, RN.

## 2018-09-17 NOTE — ED Notes (Signed)
Patient transported to CT 

## 2018-09-17 NOTE — ED Provider Notes (Signed)
[facility] EMERGENCY DEPARTMENT Provider Note   CSN: [id number] Arrival date & time: 09/17/18  1223    History   Chief Complaint Chief Complaint  Patient presents with  . Shortness of Breath    HPI Ryan [name] is a 58 y.o. male.     HPI  Pt was seen at 1300. Per pt, c/o gradual onset and worsening of persistent SOB and cough for the past 2 days. Has been associated with lightheadedness and right sided chest "pains." Pt states he has been taking his lasix as prescribed, but his "legs are still swollen" and he "isn't urinating a lot." Pt states he was discharged from the hospital last week for dx pneumonia and influenza. Denies palpitations, no syncope, no N/V/D, no back pain, no fevers, no rash, no focal motor weakness.     Past Medical History:  Diagnosis Date  . Atrial fibrillation ([place])    Not anticoagulated  . Barrett's esophagus   . Borderline diabetes   . Bronchitis   . CAP (community acquired pneumonia) 07/10/2013   04/2015  . 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 Chronic back pain   . Chronic left shoulder pain   . Chronic neck pain   . Chronic respiratory failure ([place])   . Collagen vascular disease ([place])   . COPD (chronic obstructive pulmonary disease) ([place])   . Coronary atherosclerosis of native coronary artery    Mild nonobstructive disease at catheterization 2007  . DDD (degenerative disc disease)    Cervical and thoracic  . Diastolic heart failure ([place])   . GERD (gastroesophageal reflux disease)   . History of pneumonia   . Hypercholesteremia   . On home O2    6L N/C  . Polycythemia   . Seizures ([place])    Last seizure 2 yrs ago  . Smoker   . Type 2 diabetes mellitus ([place])   . Type 2 diabetes mellitus without complication [facility])     Patient Active Problem List   Diagnosis Date Noted  . Influenza due to identified novel influenza A virus with other respiratory manifestations 09/05/2018  . HCAP  (healthcare-associated pneumonia) 08/26/2018  . GERD (gastroesophageal reflux disease) 08/04/2018  . Chest pain 06/20/2018  . Left-sided chest pain 06/19/2018  . Dysphagia 04/26/2018  . Acute on chronic respiratory failure with hypoxia ([place]) 04/20/2018  . Vision disorder 12/30/2017  . Umbilical hernia 02/72/5366  . Acute on chronic diastolic heart failure ([place]) 10/24/2017  . Lower extremity edema 10/17/2017  . MAI (mycobacterium avium-intracellulare) ([place]) 08/29/2015  . Anemia of chronic disease   . Elevated liver enzymes   . Absolute anemia   . Pulmonary fibrosis ([place]) 05/10/2015  . Chronic respiratory failure with hypoxia ([place]) 05/10/2015  . Pulmonary nodule   . Pain, generalized 05/01/2015  . Laceration 05/01/2015  . PAF (paroxysmal atrial fibrillation) ([place])   . COPD exacerbation ([place])   . Protein-calorie malnutrition, severe ([place]) 10/21/2013  . Hemoptysis 10/20/2013  . Atrial fibrillation with rapid ventricular response ([place]) 07/10/2013  . Rotator cuff syndrome of left shoulder 01/20/2012  . Spondylosis, cervical 11/19/2011  . 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 Cavitary lesion of lung 05/07/2011  . GERD with stricture   . ASCVD (arteriosclerotic cardiovascular disease)   . Type 2 diabetes mellitus without complication ([place])   . TOBACCO ABUSE 05/17/2010  . Asthma 01/20/2008  . Hyperlipidemia 12/27/2007  . 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 COLONOSCOPY  2012   Dr. Posey Pronto: normal  . ESOPHAGOGASTRODUODENOSCOPY N/A 05/04/2015   Dr. Michail Sermon: minimal erosive esophagitis, small hiatal hernia. FOOD PRECLUDED A COMPLETE EXAM. No biopsies taken  . ESOPHAGOGASTRODUODENOSCOPY  2011   [place]: Barrett's   . ESOPHAGOGASTRODUODENOSCOPY (EGD) WITH PROPOFOL N/A 05/06/2018   Dr. Gala Romney: erosive reflux esophagitis and +candida, Schatzki's ring s/p dilation, hiatal hernia.  [name] LUNG  BIOPSY    . MALONEY DILATION N/A 05/06/2018   Procedure: Venia Minks DILATION;  Surgeon: Daneil Dolin, MD;  Location: [facility] ENDO SUITE;  Service: Endoscopy;  Laterality: N/A;  . Throat biopsy    . VASECTOMY    . VASECTOMY  1987        Home Medications    Prior to Admission medications   Medication Sig Start Date End Date Taking? Authorizing Provider  albuterol (VENTOLIN HFA) 108 (90 BASE) MCG/ACT inhaler Inhale 2 puffs into the lungs every 6 (six) hours as needed for wheezing or shortness of breath.    [provider]  azithromycin (ZITHROMAX) 500 MG tablet Take 500 mg by mouth daily.    [provider]  benzonatate (TESSALON) 200 MG capsule Take 200 mg by mouth 3 (three) times daily.    [provider]  budesonide (PULMICORT) 0.25 MG/2ML nebulizer solution Take 2 mLs (0.25 mg total) by nebulization 2 (two) times daily. 10/24/17   [name], MD  CARTIA XT 120 MG 24 hr capsule Take 1 capsule (120 mg total) by mouth daily. 01/01/18   Arnoldo Lenis, MD  diphenhydrAMINE (BENADRYL) 25 mg capsule Take 25 mg by mouth every 8 (eight) hours as needed. Felt mouth swelling at ED visit 08/16/18.    [provider]  fexofenadine (ALLEGRA) 180 MG tablet Take 180 mg by mouth daily.    [provider]  fluticasone (FLONASE) 50 MCG/ACT nasal spray Place 2 sprays into both nostrils daily as needed.  02/24/18   [provider]  Fluticasone-Umeclidin-Vilant (TRELEGY ELLIPTA) 100-62.5-25 MCG/INH AEPB Inhale 1 puff into the lungs daily.    [provider]  furosemide (LASIX) 40 MG tablet Take 1 tablet (40 mg total) by mouth daily. As needed for swelling Patient taking differently: Take 40 mg by mouth daily. As needed for swelling Taking 1 tablet by mouth twice daily. 09/10/18 09/10/19  [name], MD  insulin glargine (LANTUS) 100 UNIT/ML injection Inject 0.24 mLs (24 Units total) into the skin daily. 24 units daily Patient taking differently:  Inject 40 Units into the skin daily.  10/24/17   [name], MD  insulin lispro (HUMALOG) 100 UNIT/ML injection Take by sliding scale provided by hospital maximum amount daily is 60 units.  Check blood sugar 4 times a day Patient taking differently: Inject 0-15 Units into the skin 3 (three) times daily with meals. Take by sliding scale provided by hospital maximum amount daily is 60 units.  Check blood sugar 4 times a day 09/23/17   [name], MD  ipratropium (ATROVENT) 0.02 % nebulizer solution Take 0.5 mg by nebulization 2 (two) times daily.  09/29/17   [provider]  levalbuterol Penne Lash) 0.63 MG/3ML nebulizer solution Take 0.63 mg by nebulization 2 (two) times daily.    [provider]  LORazepam (ATIVAN) 0.5 MG tablet Take 1 tablet (0.5 mg total) by mouth 2 (two) times daily as needed for anxiety or sleep. Patient taking differently: Take 0.5 mg by mouth 4 (four) times  daily. Pt takes it three times daily. 05/04/15   Cristal Ford, DO  metFORMIN (GLUCOPHAGE) 500 MG tablet Take 1 tablet (500 mg total) by mouth daily with breakfast. 09/15/14   Rexene Alberts, MD  nitroGLYCERIN (NITROSTAT) 0.4 MG SL tablet Place 1 tablet (0.4 mg total) under the tongue every 5 (five) minutes as needed for chest pain. 04/22/13   Lendon Colonel, NP  omeprazole (PRILOSEC) 20 MG capsule Take 1 capsule (20 mg total) by mouth 2 (two) times daily before a meal. 08/04/18   Annitta Needs, NP  oxyCODONE-acetaminophen (PERCOCET) 10-325 MG tablet Take 1 tablet by mouth every 4 (four) hours as needed for pain.    [provider]  potassium chloride SA (K-DUR,KLOR-CON) 20 MEQ tablet Take 1 tablet twice daily while on Lasix 09/10/18   [name], MD  predniSONE (DELTASONE) 10 MG tablet Take 2 daily for 1 week then 1 daily Patient taking differently: Take 20 mg by mouth daily with breakfast. Takes 2 tablets daily. 10/24/17   [name], MD  rifampin (RIFADIN) 300 MG capsule Take 2  capsules (600 mg total) by mouth daily. 11/17/17   Comer, Okey Regal, MD  rosuvastatin (CRESTOR) 10 MG tablet Take 10 mg by mouth every morning.  04/10/18   [provider]    Family History Family History  Problem Relation Age of Onset  . Hypertension Mother   . Diabetes Mother   . Heart attack Mother   . Heart failure Mother   . Hypertension Sister   . Diabetes Sister   . Heart failure Sister   . Colon cancer Neg Hx     Social History Social History   Tobacco Use  . Smoking status: Former Smoker    Packs/day: 0.50    Years: 45.00    Pack years: 22.50    Types: Cigarettes    Last attempt to quit: 05/01/2015    Years since quitting: 3.3  . Smokeless tobacco: Never Used  Substance Use Topics  . Alcohol use: No    Alcohol/week: 0.0 standard drinks  . Drug use: No     Allergies   Albuterol; Ciprofloxacin; and Influenza vaccine live   Review of Systems Review of Systems ROS: Statement: All systems negative except as marked or noted in the HPI; Constitutional: Negative for fever and chills. ; ; Eyes: Negative for eye pain, redness and discharge. ; ; ENMT: Negative for ear pain, hoarseness, nasal congestion, sinus pressure and sore throat. ; ; Cardiovascular: Negative for palpitations, diaphoresis, +right sided chest pain, dyspnea and peripheral edema. ; ; Respiratory: +cough. Negative for wheezing and stridor. ; ; Gastrointestinal: Negative for nausea, vomiting, diarrhea, abdominal pain, blood in stool, hematemesis, jaundice and rectal bleeding. . ; ; Genitourinary: +"decreased urination." Negative for dysuria, flank pain and hematuria. ; ; Musculoskeletal: Negative for back pain and neck pain. Negative for swelling and trauma.; ; Skin: Negative for pruritus, rash, abrasions, blisters, bruising and skin lesion.; ; Neuro: +lightheadedness. Negative for headache and neck stiffness. Negative for weakness, altered level of consciousness, altered mental status, extremity weakness,  paresthesias, involuntary movement, seizure and syncope.       Physical Exam Updated Vital Signs BP 109/86   Pulse 91   Temp 97.9 F (36.6 C) (Oral)   Resp 16   Ht 5\' 8"  (1.727 m)   Wt 77.4 kg   SpO2 100%   BMI 25.94 kg/m    Patient Vitals for the past 24 hrs:  BP Temp Temp src  Pulse Resp SpO2 Height Weight  09/17/18 1815 - - - 92 (!) 26 100 % - -  09/17/18 1804 - - - - - 98 % - -  09/17/18 1753 - - - 88 19 98 % - -  09/17/18 1712 - - - (!) 103 (!) 21 96 % - -  09/17/18 1643 134/78 - - 98 16 97 % - -  09/17/18 1600 (!) 145/83 - - 89 18 95 % - -  09/17/18 1530 133/90 - - 95 19 92 % - -  09/17/18 1500 136/81 - - 88 17 93 % - -  09/17/18 1430 (!) 113/91 - - (!) 110 18 (!) 76 % - -  09/17/18 1345 - - - 77 18 100 % - -  09/17/18 1341 - - - - - 98 % - -  09/17/18 1330 138/87 - - - 18 - - -  09/17/18 1325 125/77 - - 77 20 96 % - -  09/17/18 1300 115/70 - - 84 18 93 % - -  09/17/18 1231 109/86 97.9 F (36.6 C) Oral 91 16 100 % - -  09/17/18 1229 - - - - - - 5\' 8"  (1.727 m) 77.4 kg     13:24 Orthostatic Vital Signs VP  Orthostatic Lying   BP- Lying: 125/77  Pulse- Lying: 77      Orthostatic Sitting  BP- Sitting: 138/78  Pulse- Sitting: 84      Orthostatic Standing at 0 minutes  BP- Standing at 0 minutes: 112/69  Pulse- Standing at 0 minutes: 89     Physical Exam 1305: Physical examination:  Nursing notes reviewed; Vital signs and O2 SAT reviewed;  Constitutional: Well developed, Well nourished, Well hydrated, In no acute distress; Head:  Normocephalic, atraumatic; Eyes: EOMI, PERRL, No scleral icterus; ENMT: Mouth and pharynx normal, Mucous membranes moist; Neck: Supple, Full range of motion, No lymphadenopathy; Cardiovascular: Regular rate and rhythm, No gallop; Respiratory: Breath sounds diminished & equal bilaterally, faint scattered wheezes.  +moist cough. Speaking full sentences with ease, Normal respiratory effort/excursion; Chest: Nontender, Movement normal;  Abdomen: Soft, +diffuse tenderness to palp, +distended. Normal bowel sounds; Genitourinary: No CVA tenderness; Extremities: Peripheral pulses normal, No tenderness, +1 pedal edema bilat. No calf asymmetry.; Neuro: AA&Ox3, Major CN grossly intact.  Speech clear. No gross focal motor or sensory deficits in extremities.; Skin: Color normal, Warm, Dry.   ED Treatments / Results  Labs (all labs ordered are listed, but only abnormal results are displayed)   EKG EKG Interpretation  Date/Time:  Friday September 17 2018 12:43:46 EST Ventricular Rate:  86 PR Interval:    QRS Duration: 85 QT Interval:  358 QTC Calculation: 429 R Axis:   55 Text Interpretation:  Sinus rhythm Borderline short PR interval Abnormal R-wave progression, early transition When compared with ECG of 09/08/2018 Rate slower Otherwise no significant change Confirmed by Francine Graven [phone]) on 09/17/2018 1:14:17 PM   Radiology   Procedures Procedures (including critical care time)  Medications Ordered in ED Medications  levalbuterol (XOPENEX) nebulizer solution 0.63 mg (has no administration in time range)  ipratropium (ATROVENT) nebulizer solution 0.5 mg (has no administration in time range)     Initial Impression / Assessment and Plan / ED Course  I have reviewed the triage vital signs and the nursing notes.  Pertinent labs & imaging results that were available during my care of the patient were reviewed by me and considered in my medical decision making (see chart for details).  MDM Reviewed: previous chart, nursing note and vitals Reviewed previous: labs and ECG Interpretation: labs, ECG, x-ray and CT scan   Results for orders placed or performed during the hospital encounter of 09/17/18  Comprehensive metabolic panel  Result Value Ref Range   Sodium 135 135 - 145 mmol/L   Potassium 3.9 3.5 - 5.1 mmol/L   Chloride 94 (L) 98 - 111 mmol/L   CO2 30 22 - 32 mmol/L   Glucose, Bld 280 (H) 70 - 99 mg/dL    BUN 14 6 - 20 mg/dL   Creatinine, Ser 0.92 0.61 - 1.24 mg/dL   Calcium 8.7 (L) 8.9 - 10.3 mg/dL   Total Protein 7.0 6.5 - 8.1 g/dL   Albumin 3.2 (L) 3.5 - 5.0 g/dL   AST 21 15 - 41 U/L   ALT 47 (H) 0 - 44 U/L   Alkaline Phosphatase 55 38 - 126 U/L   Total Bilirubin 0.3 0.3 - 1.2 mg/dL   GFR calc non Af Amer >60 >60 mL/min   GFR calc Af Amer >60 >60 mL/min   Anion gap 11 5 - 15  Brain natriuretic peptide  Result Value Ref Range   B Natriuretic Peptide 24.0 0.0 - 100.0 pg/mL  Lipase, blood  Result Value Ref Range   Lipase 17 11 - 51 U/L  Troponin I - Once  Result Value Ref Range   Troponin I <0.03 <0.03 ng/mL  Lactic acid, plasma  Result Value Ref Range   Lactic Acid, Venous 1.3 0.5 - 1.9 mmol/L  Lactic acid, plasma  Result Value Ref Range   Lactic Acid, Venous 1.4 0.5 - 1.9 mmol/L  CBC with Differential  Result Value Ref Range   WBC 6.6 4.0 - 10.5 K/uL   RBC 4.41 4.22 - 5.81 MIL/uL   Hemoglobin 11.8 (L) 13.0 - 17.0 g/dL   HCT 38.0 (L) 39.0 - 52.0 %   MCV 86.2 80.0 - 100.0 fL   MCH 26.8 26.0 - 34.0 pg   MCHC 31.1 30.0 - 36.0 g/dL   RDW 14.4 11.5 - 15.5 %   Platelets 142 (L) 150 - 400 K/uL   nRBC 0.0 0.0 - 0.2 %   Neutrophils Relative % 60 %   Neutro Abs 4.0 1.7 - 7.7 K/uL   Lymphocytes Relative 22 %   Lymphs Abs 1.5 0.7 - 4.0 K/uL   Monocytes Relative 10 %   Monocytes Absolute 0.7 0.1 - 1.0 K/uL   Eosinophils Relative 7 %   Eosinophils Absolute 0.5 0.0 - 0.5 K/uL   Basophils Relative 1 %   Basophils Absolute 0.0 0.0 - 0.1 K/uL   Immature Granulocytes 0 %   Abs Immature Granulocytes 0.02 0.00 - 0.07 K/uL  Urinalysis, Routine w reflex microscopic  Result Value Ref Range   Color, Urine YELLOW YELLOW   APPearance CLEAR CLEAR   Specific Gravity, Urine 1.021 1.005 - 1.030   pH 7.0 5.0 - 8.0   Glucose, UA >=500 (A) NEGATIVE mg/dL   Hgb urine dipstick NEGATIVE NEGATIVE   Bilirubin Urine NEGATIVE NEGATIVE   Ketones, ur NEGATIVE NEGATIVE mg/dL   Protein, ur NEGATIVE  NEGATIVE mg/dL   Nitrite NEGATIVE NEGATIVE   Leukocytes,Ua NEGATIVE NEGATIVE   RBC / HPF 0-5 0 - 5 RBC/hpf   WBC, UA 0-5 0 - 5 WBC/hpf   Bacteria, UA NONE SEEN NONE SEEN  Glucose, capillary  Result Value Ref Range   Glucose-Capillary 281 (H) 70 - 99 mg/dL   Ct Angio Chest  Pe W/cm &/or Wo Cm Result Date: 09/17/2018 CLINICAL DATA:  Right-sided chest pain with shortness of breath. Lower extremity edema. Abdominal pain. EXAM: CT ANGIOGRAPHY CHEST CT ABDOMEN AND PELVIS WITH CONTRAST TECHNIQUE: Multidetector CT imaging of the chest was performed using the standard protocol during bolus administration of intravenous contrast. Multiplanar CT image reconstructions and MIPs were obtained to evaluate the vascular anatomy. Multidetector CT imaging of the abdomen and pelvis was performed using the standard protocol during bolus administration of intravenous contrast. CONTRAST:  121mL OMNIPAQUE IOHEXOL 350 MG/ML SOLN COMPARISON:  CTA chest 06/19/2018 CT abdomen pelvis 08/12/2018 FINDINGS: CTA CHEST FINDINGS Cardiovascular: Contrast injection is sufficient to demonstrate satisfactory opacification of the pulmonary arteries to the segmental level. There is no pulmonary embolus. The main pulmonary artery is within normal limits for size. There is no CT evidence of acute right heart strain. There is moderate calcific aortic atherosclerosis. No aortic aneurysm or dissection. Proximal arch vessels are widely patent. There is a normal 3-vessel arch branching pattern. Heart size is normal, without pericardial effusion. Mediastinum/Nodes: No mediastinal, hilar or axillary lymphadenopathy. The visualized thyroid and thoracic esophageal course are unremarkable. Lungs/Pleura: There is bullous emphysema of both lungs. Chronic cavitation within the left upper and lower lobes is unchanged. No masses or pulmonary nodules. No pleural effusion or pneumothorax. Bibasilar atelectasis. Musculoskeletal: No chest wall abnormality. No acute or  significant osseous findings. Review of the MIP images confirms the above findings. CT ABDOMEN and PELVIS FINDINGS Hepatobiliary: Normal hepatic contours and density. No visible biliary dilatation. Normal gallbladder. Pancreas: Normal contours without ductal dilatation. No peripancreatic fluid collection. Spleen: Normal. Adrenals/Urinary Tract: --Adrenal glands: Normal. --Right kidney/ureter: No hydronephrosis or perinephric stranding. No nephrolithiasis. No obstructing ureteral stones. --Left kidney/ureter: No hydronephrosis or perinephric stranding. No nephrolithiasis. No obstructing ureteral stones. --Urinary bladder: Markedly distended. Stomach/Bowel: --Stomach/Duodenum: No hiatal hernia or other gastric abnormality. Normal duodenal course and caliber. --Small bowel: No dilatation or inflammation. --Colon: No focal abnormality. --Appendix: Normal. Vascular/Lymphatic: Atherosclerotic calcification is present within the non-aneurysmal abdominal aorta, without hemodynamically significant stenosis. No abdominal or pelvic lymphadenopathy. Reproductive: Normal prostate and seminal vesicles. Musculoskeletal. No bony spinal canal stenosis or focal osseous abnormality. Other: None. IMPRESSION: 1. No pulmonary embolus or acute aortic syndrome. 2. Severe bullous emphysema with unchanged areas of cavitation in the left upper and lower lobes. 3. No acute abnormality of the abdomen or pelvis. 4. Markedly distended urinary bladder. Correlate for signs of urinary retention. 5. Aortic Atherosclerosis (ICD10-I70.0) and Emphysema (ICD10-J43.9). Electronically Signed   By: Ulyses Jarred M.D.   On: 09/17/2018 14:45     Ct Abdomen Pelvis W Contrast Result Date: 09/17/2018 CLINICAL DATA:  Right-sided chest pain with shortness of breath. Lower extremity edema. Abdominal pain. EXAM: CT ANGIOGRAPHY CHEST CT ABDOMEN AND PELVIS WITH CONTRAST TECHNIQUE: Multidetector CT imaging of the chest was performed using the standard protocol  during bolus administration of intravenous contrast. Multiplanar CT image reconstructions and MIPs were obtained to evaluate the vascular anatomy. Multidetector CT imaging of the abdomen and pelvis was performed using the standard protocol during bolus administration of intravenous contrast. CONTRAST:  130mL OMNIPAQUE IOHEXOL 350 MG/ML SOLN COMPARISON:  CTA chest 06/19/2018 CT abdomen pelvis 08/12/2018 FINDINGS: CTA CHEST FINDINGS Cardiovascular: Contrast injection is sufficient to demonstrate satisfactory opacification of the pulmonary arteries to the segmental level. There is no pulmonary embolus. The main pulmonary artery is within normal limits for size. There is no CT evidence of acute right heart strain. There is moderate calcific aortic atherosclerosis.  No aortic aneurysm or dissection. Proximal arch vessels are widely patent. There is a normal 3-vessel arch branching pattern. Heart size is normal, without pericardial effusion. Mediastinum/Nodes: No mediastinal, hilar or axillary lymphadenopathy. The visualized thyroid and thoracic esophageal course are unremarkable. Lungs/Pleura: There is bullous emphysema of both lungs. Chronic cavitation within the left upper and lower lobes is unchanged. No masses or pulmonary nodules. No pleural effusion or pneumothorax. Bibasilar atelectasis. Musculoskeletal: No chest wall abnormality. No acute or significant osseous findings. Review of the MIP images confirms the above findings. CT ABDOMEN and PELVIS FINDINGS Hepatobiliary: Normal hepatic contours and density. No visible biliary dilatation. Normal gallbladder. Pancreas: Normal contours without ductal dilatation. No peripancreatic fluid collection. Spleen: Normal. Adrenals/Urinary Tract: --Adrenal glands: Normal. --Right kidney/ureter: No hydronephrosis or perinephric stranding. No nephrolithiasis. No obstructing ureteral stones. --Left kidney/ureter: No hydronephrosis or perinephric stranding. No nephrolithiasis. No  obstructing ureteral stones. --Urinary bladder: Markedly distended. Stomach/Bowel: --Stomach/Duodenum: No hiatal hernia or other gastric abnormality. Normal duodenal course and caliber. --Small bowel: No dilatation or inflammation. --Colon: No focal abnormality. --Appendix: Normal. Vascular/Lymphatic: Atherosclerotic calcification is present within the non-aneurysmal abdominal aorta, without hemodynamically significant stenosis. No abdominal or pelvic lymphadenopathy. Reproductive: Normal prostate and seminal vesicles. Musculoskeletal. No bony spinal canal stenosis or focal osseous abnormality. Other: None. IMPRESSION: 1. No pulmonary embolus or acute aortic syndrome. 2. Severe bullous emphysema with unchanged areas of cavitation in the left upper and lower lobes. 3. No acute abnormality of the abdomen or pelvis. 4. Markedly distended urinary bladder. Correlate for signs of urinary retention. 5. Aortic Atherosclerosis (ICD10-I70.0) and Emphysema (ICD10-J43.9). Electronically Signed   By: Ulyses Jarred M.D.   On: 09/17/2018 14:45   Dg Chest Port 1 View Result Date: 09/17/2018 CLINICAL DATA:  Difficulty breathing and cough EXAM: PORTABLE CHEST 1 VIEW COMPARISON:  August 26, 2018 FINDINGS: The heart size and mediastinal contours are within normal limits. Patchy consolidation of right lung base is identified. There is no pulmonary edema or pleural effusion. The visualized skeletal structures are stable. IMPRESSION: Patchy consolidation of medial right lung base suspicious for pneumonia. Electronically Signed   By: Abelardo Diesel M.D.   On: 09/17/2018 13:41     6468:  Bladder scan ordered before CT scan but not completed until afterwards: +urinary retention, and Foley catheter was placed. Multiple short nebs given (xopenex) because pt states he cannot tolerate albuterol. Pt unable to take more than a few steps from bed to bedside commode without c/o increasing SOB, generalized weakness and lightheadedness. Dx and  testing d/w pt and family.  Questions answered.  Verb understanding, agreeable to admit. T/C returned from [facility] Dr. Posey Pronto, case discussed, including:  HPI, pertinent PM/SHx, VS/PE, dx testing, ED course and treatment:  Agreeable to admit.       Final Clinical Impressions(s) / ED Diagnoses   Final diagnoses:  None    ED Discharge Orders    None       Francine Graven, DO 09/18/18 2126

## 2018-09-18 ENCOUNTER — Other Ambulatory Visit: Payer: Self-pay | Admitting: Internal Medicine

## 2018-09-18 DIAGNOSIS — J441 Chronic obstructive pulmonary disease with (acute) exacerbation: Secondary | ICD-10-CM | POA: Diagnosis not present

## 2018-09-18 LAB — BASIC METABOLIC PANEL
Anion gap: 10 (ref 5–15)
BUN: 12 mg/dL (ref 6–20)
CO2: 29 mmol/L (ref 22–32)
Calcium: 8.8 mg/dL — ABNORMAL LOW (ref 8.9–10.3)
Chloride: 95 mmol/L — ABNORMAL LOW (ref 98–111)
Creatinine, Ser: 0.83 mg/dL (ref 0.61–1.24)
GFR calc Af Amer: >60 mL/min (ref >60)
GFR calc non Af Amer: >60 mL/min (ref >60)
Glucose, Bld: 155 mg/dL — ABNORMAL HIGH (ref 70–99)
Potassium: 3.9 mmol/L (ref 3.5–5.1)
Sodium: 134 mmol/L — ABNORMAL LOW (ref 135–145)

## 2018-09-18 LAB — GLUCOSE, CAPILLARY
Glucose-Capillary: 141 mg/dL — ABNORMAL HIGH (ref 70–99)
Glucose-Capillary: 245 mg/dL — ABNORMAL HIGH (ref 70–99)
Glucose-Capillary: 325 mg/dL — ABNORMAL HIGH (ref 70–99)
Glucose-Capillary: 176 mg/dL — ABNORMAL HIGH (ref 70–99)

## 2018-09-18 LAB — CBC
HCT: 34 % — ABNORMAL LOW (ref 39.0–52.0)
Hemoglobin: 10.6 g/dL — ABNORMAL LOW (ref 13.0–17.0)
MCH: 27 pg (ref 26.0–34.0)
MCHC: 31.2 g/dL (ref 30.0–36.0)
MCV: 86.5 fL (ref 80.0–100.0)
NRBC: 0 % (ref 0.0–0.2)
Platelets: 142 10*3/uL — ABNORMAL LOW (ref 150–400)
RBC: 3.93 MIL/uL — ABNORMAL LOW (ref 4.22–5.81)
RDW: 14.4 % (ref 11.5–15.5)
WBC: 7.1 10*3/uL (ref 4.0–10.5)

## 2018-09-18 MED ORDER — LEVALBUTEROL HCL 0.63 MG/3ML IN NEBU
0.6300 mg | INHALATION_SOLUTION | Freq: Two times a day (BID) | RESPIRATORY_TRACT | Status: DC
  Administered 2018-09-18 – 2018-09-19 (×2): 0.63 mg via RESPIRATORY_TRACT
  Filled 2018-09-18: qty 3

## 2018-09-18 MED ORDER — TAMSULOSIN HCL 0.4 MG PO CAPS
0.4000 mg | ORAL_CAPSULE | Freq: Every day | ORAL | Status: DC
  Administered 2018-09-18: 0.4 mg via ORAL
  Filled 2018-09-18: qty 1

## 2018-09-18 MED ORDER — TAMSULOSIN HCL 0.4 MG PO CAPS
0.4000 mg | ORAL_CAPSULE | Freq: Two times a day (BID) | ORAL | Status: DC
  Administered 2018-09-18 – 2018-09-19 (×2): 0.4 mg via ORAL
  Filled 2018-09-18 (×3): qty 1

## 2018-09-18 NOTE — Progress Notes (Signed)
Patient Demographics:    Ryan [name], is a 58 y.o. male, DOB - 26-Mar-1961, XKG:[id number]  Admit date - 09/17/2018   Admitting Physician Lenore Cordia, MD  Outpatient Primary MD for the patient is [name], MD  LOS - 0   Chief Complaint  Patient presents with  . Shortness of Breath        Subjective:    Ryan [name] today has no fevers, no emesis,  No chest pain, complains of inability to void, cough and wheezing noted,  Assessment  & Plan :    Principal Problem:   COPD exacerbation (HCC) Active Problems:   Hyperlipidemia   Type 2 diabetes mellitus without complication (HCC)   PAF (paroxysmal atrial fibrillation) (HCC)   Chronic respiratory failure with hypoxia (HCC)   Chronic diastolic CHF (congestive heart failure) (HCC)   Urinary retention   Chronic pain   Anxiety  Brief Summary:- 58 y.o. male with medical history significant for severe COPD with chronic hypoxic respiratory failure on 6 L O2 via [place] at home at all times, atrial fibrillation not on anticoagulation, chronic diastolic CHF EF 70-26%, insulin-dependent type 2 diabetes, hyperlipidemia, nonobstructive CAD, chronic pain, and anxiety   Plan:- 1)Urinary Retention----1400 mL of urine removed with in and out catheterization on admission, suspect urinary retention due to anticholinergic effects of bronchodilators and opiates with some underlying BPH with LUTs--- treat empirically with Flomax, bladder scans from time to time if persistent urinary retention may place Foley, CT abdomen without evidence of obstructive uropathy, no hydronephrosis, no nephrolithiasis, no perinephric stranding  2) acute COPD exacerbation----okay to continue prednisone 40 mg daily, along with bronchodilators, mucolytics and azithromycin, supplemental oxygen as ordered  3)DM2-last A1c 8.7, fleeting poor diabetic control, continue Lantus insulin 30 units daily  along with sliding scale coverage , hold metformin due to contrast study  4) paroxysmal atrial fibrillation----...  is currently in sinus rhythm, CHA2DS2- VASc score   is = 3 (dCHF, HTN, DM)   Which is  equal to = 3.2 % annual risk of stroke , patient states he was told not to be on anticoagulation will defer decision to PCP, continue Cardizem for rate control   Disposition/Need for in-Hospital Stay- patient unable to be discharged at this time due to urinary retention may require further urological intervention  Code Status : Full  Family Communication:   na   Disposition Plan  : home  Consults  :  na  DVT Prophylaxis  :  Lovenox   Lab Results  Component Value Date   PLT 142 (L) 09/18/2018    Inpatient Medications  Scheduled Meds: . azithromycin  500 mg Oral Daily  . benzonatate  200 mg Oral TID  . budesonide  0.25 mg Nebulization BID  . diltiazem  120 mg Oral Daily  . enoxaparin (LOVENOX) injection  40 mg Subcutaneous Q24H  . fluticasone furoate-vilanterol  1 puff Inhalation Daily   And  . umeclidinium bromide  1 puff Inhalation Daily  . insulin aspart  0-15 Units Subcutaneous TID WC  . insulin aspart  0-5 Units Subcutaneous QHS  . insulin glargine  30 Units Subcutaneous QHS  . ipratropium  0.5 mg Nebulization BID  . levalbuterol  0.63 mg Nebulization BID  .  mouth rinse  15 mL Mouth Rinse BID  . pantoprazole  40 mg Oral Daily  . predniSONE  40 mg Oral Q breakfast  . rosuvastatin  10 mg Oral q1800  . tamsulosin  0.4 mg Oral BID   Continuous Infusions: PRN Meds:.acetaminophen **OR** acetaminophen, levalbuterol, LORazepam, oxyCODONE-acetaminophen **AND** oxyCODONE    Anti-infectives (From admission, onward)   Start     Dose/Rate Route Frequency Ordered Stop   09/17/18 2000  azithromycin (ZITHROMAX) tablet 500 mg     500 mg Oral Daily 09/17/18 1953          Objective:   Vitals:   09/18/18 0634 09/18/18 0732 09/18/18 0909 09/18/18 1332  BP: 118/75   135/75   Pulse: 87   (!) 104  Resp: 18   20  Temp: 98.3 F (36.8 C)   98.3 F (36.8 C)  TempSrc: Oral   Oral  SpO2: 96% 97% 96% 95%  Weight:      Height:        Wt Readings from Last 3 Encounters:  09/18/18 76.4 kg  09/10/18 78.3 kg  08/16/18 78 kg     Intake/Output Summary (Last 24 hours) at 09/18/2018 1339 Last data filed at 09/18/2018 1338 Gross per 24 hour  Intake 720 ml  Output 2101 ml  Net -1381 ml     Physical Exam Patient is examined daily including today on 09/18/18 , exams remain the same as of yesterday except that has changed   Gen:- Awake Alert,  In no apparent distress   HEENT:- [place].AT, No sclera icterus Nose- [place] 2 L/min Neck-Supple Neck,No JVD,.  Lungs-patient bases, few scattered wheezes  CV- S1, S2 normal, regular (history of PA. Fib) Abd-  +ve B.Sounds, Abd Soft, No tenderness,    Extremity/Skin:- No  edema, pedal pulses present  Psych-affect is appropriate, oriented x3 Neuro-no new focal deficits, no tremors   Data Review:   Micro Results No results found for this or any previous visit (from the past 240 hour(s)).  Radiology Reports Ct Angio Chest Pe W/cm &/or Wo Cm  Result Date: 09/17/2018 CLINICAL DATA:  Right-sided chest pain with shortness of breath. Lower extremity edema. Abdominal pain. EXAM: CT ANGIOGRAPHY CHEST CT ABDOMEN AND PELVIS WITH CONTRAST TECHNIQUE: Multidetector CT imaging of the chest was performed using the standard protocol during bolus administration of intravenous contrast. Multiplanar CT image reconstructions and MIPs were obtained to evaluate the vascular anatomy. Multidetector CT imaging of the abdomen and pelvis was performed using the standard protocol during bolus administration of intravenous contrast. CONTRAST:  114mL OMNIPAQUE IOHEXOL 350 MG/ML SOLN COMPARISON:  CTA chest 06/19/2018 CT abdomen pelvis 08/12/2018 FINDINGS: CTA CHEST FINDINGS Cardiovascular: Contrast injection is sufficient to demonstrate satisfactory opacification of  the pulmonary arteries to the segmental level. There is no pulmonary embolus. The main pulmonary artery is within normal limits for size. There is no CT evidence of acute right heart strain. There is moderate calcific aortic atherosclerosis. No aortic aneurysm or dissection. Proximal arch vessels are widely patent. There is a normal 3-vessel arch branching pattern. Heart size is normal, without pericardial effusion. Mediastinum/Nodes: No mediastinal, hilar or axillary lymphadenopathy. The visualized thyroid and thoracic esophageal course are unremarkable. Lungs/Pleura: There is bullous emphysema of both lungs. Chronic cavitation within the left upper and lower lobes is unchanged. No masses or pulmonary nodules. No pleural effusion or pneumothorax. Bibasilar atelectasis. Musculoskeletal: No chest wall abnormality. No acute or significant osseous findings. Review of the MIP images confirms the above findings.  CT ABDOMEN and PELVIS FINDINGS Hepatobiliary: Normal hepatic contours and density. No visible biliary dilatation. Normal gallbladder. Pancreas: Normal contours without ductal dilatation. No peripancreatic fluid collection. Spleen: Normal. Adrenals/Urinary Tract: --Adrenal glands: Normal. --Right kidney/ureter: No hydronephrosis or perinephric stranding. No nephrolithiasis. No obstructing ureteral stones. --Left kidney/ureter: No hydronephrosis or perinephric stranding. No nephrolithiasis. No obstructing ureteral stones. --Urinary bladder: Markedly distended. Stomach/Bowel: --Stomach/Duodenum: No hiatal hernia or other gastric abnormality. Normal duodenal course and caliber. --Small bowel: No dilatation or inflammation. --Colon: No focal abnormality. --Appendix: Normal. Vascular/Lymphatic: Atherosclerotic calcification is present within the non-aneurysmal abdominal aorta, without hemodynamically significant stenosis. No abdominal or pelvic lymphadenopathy. Reproductive: Normal prostate and seminal vesicles.  Musculoskeletal. No bony spinal canal stenosis or focal osseous abnormality. Other: None. IMPRESSION: 1. No pulmonary embolus or acute aortic syndrome. 2. Severe bullous emphysema with unchanged areas of cavitation in the left upper and lower lobes. 3. No acute abnormality of the abdomen or pelvis. 4. Markedly distended urinary bladder. Correlate for signs of urinary retention. 5. Aortic Atherosclerosis (ICD10-I70.0) and Emphysema (ICD10-J43.9). Electronically Signed   By: Ulyses Jarred M.D.   On: 09/17/2018 14:45   Ct Angio Chest Pe W Or Wo Contrast  Result Date: 09/02/2018 CLINICAL DATA:  Chronic dyspnea, shortness of breath, COPD, on oxygen therapy, difficulty breathing worse supine, worsening breathing since December 2019; history hypertension, type II diabetes mellitus, atrial fibrillation, collagen vascular disease, former smoker EXAM: CT ANGIOGRAPHY CHEST WITH CONTRAST TECHNIQUE: Multidetector CT imaging of the chest was performed using the standard protocol during bolus administration of intravenous contrast. Multiplanar CT image reconstructions and MIPs were obtained to evaluate the vascular anatomy. CONTRAST:  168mL ISOVUE-370 IOPAMIDOL (ISOVUE-370) INJECTION 76% IV COMPARISON:  06/19/2018, 02/12/2017 FINDINGS: Cardiovascular: Atherosclerotic calcifications of thoracic aorta and coronary arteries. Aorta normal caliber. No aneurysm or dissection. Pulmonary arteries well opacified and patent. No evidence of pulmonary embolism. No pericardial effusion. Mediastinum/Nodes: Esophagus unremarkable. Tiny hiatal hernia. Base of cervical region normal appearance. No thoracic adenopathy. Lungs/Pleura: Severe emphysematous changes with bullet the apices. LEFT upper lobe scarring. Again identified slightly thicker walled cavitary focus at LEFT apex unchanged, with an area of dependent density or nodularity along the posterior aspect unchanged since previous exam as well as an earlier study from 2018. Additional  parenchymal scarring in LEFT lower lobe, likely resolving infected bulla with thinner wall and less fluid/thickening than on previous exam. Minimal dependent RIGHT basilar atelectasis. No acute infiltrate, pleural effusion or pneumothorax. No definite pulmonary mass/nodule. Upper Abdomen: Unremarkable Musculoskeletal: Demineralized, otherwise unremarkable Review of the MIP images confirms the above findings. IMPRESSION: No evidence pulmonary embolism. Scattered atherosclerotic calcifications aorta and coronary arteries. COPD changes with bullous disease at apices and at RIGHT base with scattered areas of parenchymal scarring and atelectasis grossly stable since prior study. Chronic thickening of the walls of a bulla/cavitary focus at the LEFT apex appears unchanged since 2018, recommend attention on follow-up imaging. Aortic Atherosclerosis (ICD10-I70.0) and Emphysema (ICD10-J43.9). Electronically Signed   By: Lavonia Dana M.D.   On: 09/02/2018 12:15   Ct Abdomen Pelvis W Contrast  Result Date: 09/17/2018 CLINICAL DATA:  Right-sided chest pain with shortness of breath. Lower extremity edema. Abdominal pain. EXAM: CT ANGIOGRAPHY CHEST CT ABDOMEN AND PELVIS WITH CONTRAST TECHNIQUE: Multidetector CT imaging of the chest was performed using the standard protocol during bolus administration of intravenous contrast. Multiplanar CT image reconstructions and MIPs were obtained to evaluate the vascular anatomy. Multidetector CT imaging of the abdomen and pelvis was performed using the standard protocol  during bolus administration of intravenous contrast. CONTRAST:  178mL OMNIPAQUE IOHEXOL 350 MG/ML SOLN COMPARISON:  CTA chest 06/19/2018 CT abdomen pelvis 08/12/2018 FINDINGS: CTA CHEST FINDINGS Cardiovascular: Contrast injection is sufficient to demonstrate satisfactory opacification of the pulmonary arteries to the segmental level. There is no pulmonary embolus. The main pulmonary artery is within normal limits for size.  There is no CT evidence of acute right heart strain. There is moderate calcific aortic atherosclerosis. No aortic aneurysm or dissection. Proximal arch vessels are widely patent. There is a normal 3-vessel arch branching pattern. Heart size is normal, without pericardial effusion. Mediastinum/Nodes: No mediastinal, hilar or axillary lymphadenopathy. The visualized thyroid and thoracic esophageal course are unremarkable. Lungs/Pleura: There is bullous emphysema of both lungs. Chronic cavitation within the left upper and lower lobes is unchanged. No masses or pulmonary nodules. No pleural effusion or pneumothorax. Bibasilar atelectasis. Musculoskeletal: No chest wall abnormality. No acute or significant osseous findings. Review of the MIP images confirms the above findings. CT ABDOMEN and PELVIS FINDINGS Hepatobiliary: Normal hepatic contours and density. No visible biliary dilatation. Normal gallbladder. Pancreas: Normal contours without ductal dilatation. No peripancreatic fluid collection. Spleen: Normal. Adrenals/Urinary Tract: --Adrenal glands: Normal. --Right kidney/ureter: No hydronephrosis or perinephric stranding. No nephrolithiasis. No obstructing ureteral stones. --Left kidney/ureter: No hydronephrosis or perinephric stranding. No nephrolithiasis. No obstructing ureteral stones. --Urinary bladder: Markedly distended. Stomach/Bowel: --Stomach/Duodenum: No hiatal hernia or other gastric abnormality. Normal duodenal course and caliber. --Small bowel: No dilatation or inflammation. --Colon: No focal abnormality. --Appendix: Normal. Vascular/Lymphatic: Atherosclerotic calcification is present within the non-aneurysmal abdominal aorta, without hemodynamically significant stenosis. No abdominal or pelvic lymphadenopathy. Reproductive: Normal prostate and seminal vesicles. Musculoskeletal. No bony spinal canal stenosis or focal osseous abnormality. Other: None. IMPRESSION: 1. No pulmonary embolus or acute aortic  syndrome. 2. Severe bullous emphysema with unchanged areas of cavitation in the left upper and lower lobes. 3. No acute abnormality of the abdomen or pelvis. 4. Markedly distended urinary bladder. Correlate for signs of urinary retention. 5. Aortic Atherosclerosis (ICD10-I70.0) and Emphysema (ICD10-J43.9). Electronically Signed   By: Ulyses Jarred M.D.   On: 09/17/2018 14:45   Dg Chest Port 1 View  Result Date: 09/17/2018 CLINICAL DATA:  Difficulty breathing and cough EXAM: PORTABLE CHEST 1 VIEW COMPARISON:  August 26, 2018 FINDINGS: The heart size and mediastinal contours are within normal limits. Patchy consolidation of right lung base is identified. There is no pulmonary edema or pleural effusion. The visualized skeletal structures are stable. IMPRESSION: Patchy consolidation of medial right lung base suspicious for pneumonia. Electronically Signed   By: Abelardo Diesel M.D.   On: 09/17/2018 13:41   Dg Chest Port 1 View  Result Date: 08/26/2018 CLINICAL DATA:  Shortness of breath EXAM: PORTABLE CHEST 1 VIEW COMPARISON:  08/16/2018 FINDINGS: Cardiac shadows within normal limits. The lungs are well aerated bilaterally. Emphysematous changes are again seen. Increasing left basilar infiltrate is noted. Superimposed over chronic changes in the bases. No acute bony abnormality is noted. IMPRESSION: Acute left basilar infiltrate superimposed over chronic changes in the bases Electronically Signed   By: Inez Catalina M.D.   On: 08/26/2018 19:50   Dg Foot Complete Right  Result Date: 09/05/2018 CLINICAL DATA:  Right foot pain and swelling. EXAM: RIGHT FOOT COMPLETE - 3+ VIEW COMPARISON:  05/01/2015 FINDINGS: Soft tissue swelling noted at the midfoot. No underlying fracture or dislocation evident. No suspicious lytic or sclerotic osseous abnormality. No evidence for radiopaque soft tissue foreign body. IMPRESSION: Soft tissue swelling without acute bony  abnormality. Electronically Signed   By: Misty Stanley M.D.    On: 09/05/2018 14:18     CBC Recent Labs  Lab 09/17/18 1255 09/18/18 0613  WBC 6.6 7.1  HGB 11.8* 10.6*  HCT 38.0* 34.0*  PLT 142* 142*  MCV 86.2 86.5  MCH 26.8 27.0  MCHC 31.1 31.2  RDW 14.4 14.4  LYMPHSABS 1.5  --   MONOABS 0.7  --   EOSABS 0.5  --   BASOSABS 0.0  --     Chemistries  Recent Labs  Lab 09/17/18 1255 09/18/18 0613  NA 135 134*  K 3.9 3.9  CL 94* 95*  CO2 30 29  GLUCOSE 280* 155*  BUN 14 12  CREATININE 0.92 0.83  CALCIUM 8.7* 8.8*  AST 21  --   ALT 47*  --   ALKPHOS 55  --   BILITOT 0.3  --    ------------------------------------------------------------------------------------------------------------------ No results for input(s): CHOL, HDL, LDLCALC, TRIG, CHOLHDL, LDLDIRECT in the last 72 hours.  Lab Results  Component Value Date   HGBA1C 8.7 (H) 11/28/2017   ------------------------------------------------------------------------------------------------------------------ No results for input(s): TSH, T4TOTAL, T3FREE, THYROIDAB in the last 72 hours.  Invalid input(s): FREET3 ------------------------------------------------------------------------------------------------------------------ No results for input(s): VITAMINB12, FOLATE, FERRITIN, TIBC, IRON, RETICCTPCT in the last 72 hours.  Coagulation profile No results for input(s): INR, PROTIME in the last 168 hours.  No results for input(s): DDIMER in the last 72 hours.  Cardiac Enzymes Recent Labs  Lab 09/17/18 1255  TROPONINI <0.03   ------------------------------------------------------------------------------------------------------------------    Component Value Date/Time   BNP 24.0 09/17/2018 1309     Seville Brick M.D on 09/18/2018 at 1:39 PM  Go to [email or web address] - for contact info  [facility] - Office  [phone]

## 2018-09-19 DIAGNOSIS — J441 Chronic obstructive pulmonary disease with (acute) exacerbation: Secondary | ICD-10-CM | POA: Diagnosis not present

## 2018-09-19 LAB — GLUCOSE, CAPILLARY
Glucose-Capillary: 153 mg/dL — ABNORMAL HIGH (ref 70–99)
Glucose-Capillary: 310 mg/dL — ABNORMAL HIGH (ref 70–99)

## 2018-09-19 LAB — URINE CULTURE: Culture: NO GROWTH

## 2018-09-19 MED ORDER — ACETAMINOPHEN 325 MG PO TABS: 650.0000 mg | ORAL_TABLET | Freq: Four times a day (QID) | ORAL | 1 refills | Status: AC | PRN

## 2018-09-19 MED ORDER — AZITHROMYCIN 500 MG PO TABS: ORAL_TABLET | ORAL | 5 refills | Status: DC

## 2018-09-19 MED ORDER — RIFAMPIN 300 MG PO CAPS: 600.0000 mg | ORAL_CAPSULE | Freq: Every day | ORAL | 11 refills | Status: DC

## 2018-09-19 MED ORDER — INSULIN GLARGINE 100 UNIT/ML ~~LOC~~ SOLN: 40.0000 [IU] | Freq: Every day | SUBCUTANEOUS | 2 refills | Status: DC

## 2018-09-19 MED ORDER — TAMSULOSIN HCL 0.4 MG PO CAPS: 0.4000 mg | ORAL_CAPSULE | Freq: Two times a day (BID) | ORAL | 2 refills | Status: AC

## 2018-09-19 NOTE — Progress Notes (Addendum)
Patient is urinating without difficulty at this time.  Bladder scan completed per order and 81 ml residual urine noted.  Foley catheter not inserted at this time due to bladder scan volume being less than 200 ml.  RN will continue to monitor patient and make MD aware of any changes.  P.J. Linus Mako, RN

## 2018-09-19 NOTE — Discharge Instructions (Signed)
1) take Flomax/tamsulosin 0.4 mg twice a day to help you with urination 2) have Dr. Luan Pulling refer you to a urologist to further evaluate your problems with urination 3) you are taking multiple/different bronchodilators to help you breathe please review your breathing medications with Dr. 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 Luan Pulling

## 2018-09-19 NOTE — Discharge Summary (Signed)
Ryan [name], is a 58 y.o. male  DOB 12/23/1960  MRN [id number].  Admission date:  09/17/2018  Admitting Physician  Lenore Cordia, MD  Discharge Date:  09/19/2018   Primary MD  [name], MD  Recommendations for primary care physician for things to follow:   1) take Flomax/tamsulosin 0.4 mg twice a day to help you with urination 2) have Dr. Luan Pulling refer you to a urologist to further evaluate your problems with urination 3) you are taking multiple/different bronchodilators to help you breathe please review your breathing medications with Dr. 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 Luan Pulling   Admission Diagnosis  Shortness of breath [R06.02] Urinary retention [R33.9] COPD with acute exacerbation ([place]) [J44.1]   Discharge Diagnosis  Shortness of breath [R06.02] Urinary retention [R33.9] COPD with acute exacerbation ([place]) [J44.1]    Principal Problem:   COPD exacerbation ([place]) Active Problems:   Hyperlipidemia   Type 2 diabetes mellitus without complication (HCC)   PAF (paroxysmal atrial fibrillation) (HCC)   Chronic respiratory failure with hypoxia (HCC)   Chronic diastolic CHF (congestive heart failure) (HCC)   Urinary retention   Chronic pain   Anxiety      Past Medical History:  Diagnosis Date  . Atrial fibrillation ([place])    Not anticoagulated  . Barrett's esophagus   . Borderline diabetes   . Bronchitis   . CAP (community acquired pneumonia) 07/10/2013   04/2015  . 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 Chronic back pain   . Chronic diastolic CHF (congestive heart failure) ([place])   . Chronic left shoulder pain   . Chronic neck pain   . Chronic respiratory failure ([place])   . Collagen vascular disease ([place])   . COPD (chronic obstructive pulmonary disease) ([place])   . Coronary  atherosclerosis of native coronary artery    Mild nonobstructive disease at catheterization 2007  . DDD (degenerative disc disease)    Cervical and thoracic  . Diastolic heart failure ([place])   . GERD (gastroesophageal reflux disease)   . History of pneumonia   . Hypercholesteremia   . On home O2    6L N/C  . Polycythemia   . Seizures ([place])    Last seizure 2 yrs ago  . Smoker   . Type 2 diabetes mellitus ([place])   . Type 2 diabetes mellitus without complication [facility])     Past Surgical History:  Procedure Laterality Date  . COLONOSCOPY  2012   Dr. Posey Pronto: normal  . ESOPHAGOGASTRODUODENOSCOPY N/A 05/04/2015   Dr. Michail Sermon: minimal erosive esophagitis, small hiatal hernia. FOOD PRECLUDED A COMPLETE EXAM. No biopsies taken  . ESOPHAGOGASTRODUODENOSCOPY  2011   [place]: Barrett's   . ESOPHAGOGASTRODUODENOSCOPY (EGD) WITH PROPOFOL N/A 05/06/2018   Dr. Gala Romney: erosive reflux esophagitis and +candida, Schatzki's ring s/p dilation, hiatal hernia.  [name] LUNG BIOPSY    . MALONEY DILATION N/A 05/06/2018   Procedure: MALONEY DILATION;  Surgeon: Gala Romney,  Cristopher Estimable, MD;  Location: [facility];  Service: Endoscopy;  Laterality: N/A;  . Throat biopsy    . VASECTOMY    . VASECTOMY  1987     HPI  from the history and physical done on the day of admission:   Chief Complaint: Shortness of breath, urinary retention  HPI: Ryan [name] is a 58 y.o. male with medical history significant for severe COPD with chronic hypoxic respiratory failure on 6 L O2 via [place] at home at all times, atrial fibrillation not on anticoagulation, chronic diastolic CHF EF 01-75%, insulin-dependent type 2 diabetes, hyperlipidemia, nonobstructive CAD, chronic pain, and anxiety who presents to the ED with several days of shortness of breath, cough, and lightheadedness.  He also reports 3 days of urinary retention.  He reports occasional chills and hot spells.  He denies any chest pain.  Patient was recently admitted from  2/13-2/28/2020 at which time he was treated for influenza A, pneumonia, and acute on chronic diastolic CHF exacerbation.  He was discharged on Lasix 40 mg daily as needed for lower extremity swelling, but says he has been taking this daily.  Despite diuretic use he has not had urine output for 3 days prior to admission.  He denies any previous history of difficulty with urination or dysuria.  ED Course:  Initial vitals in the ED showed BP 115/70, pulse 84, RR 18, temp 97.9 Fahrenheit, SPO2 100% on 6 L O2 via [place].  Labs are notable for serum glucose 280, BUN 14, creatinine 0.92, lipase 17, BNP 24, troponin I<0.03, lactic acid 1.3, WBC 6.6, hemoglobin 11.8, platelets 142, urinalysis negative for UTI.  Portable chest x-ray showed patchy consolidation at the medial right lung base.  CTA chest and abdomen/pelvis with contrast was obtained which was negative for PE.  Severe bullous emphysema with unchanged cavitation in the left upper and lower lobes worse noted.  Markedly distended urinary bladder was seen.  Patient was given Atrovent and Xopenex breathing treatments, IV Solu-Medrol 125 mg, and a Foley catheter was placed with removal of 1400 cc.  The hospital service was consulted admit for further management.    Hospital Course:   Brief Summary:- 58 y.o.malewith medical history significant forsevere COPD with chronic hypoxic respiratory failure on 6 L O2 via [place] at home at all times, atrial fibrillation not on anticoagulation, chronic diastolic CHF EF 10-25%, insulin-dependent type 2 diabetes, hyperlipidemia, nonobstructive CAD, chronic pain, and anxiety , patient declined further catheterization, started to void well with Flomax.   Plan:- 1)Urinary Retention----1400 mL of urine removed with in and out catheterization on admission, suspect urinary retention due to anticholinergic effects of bronchodilators and opiates with some underlying BPH with LUTs--- overall much improved with Flomax,  bladder scans from time to time with minimal postvoid residual, CT abdomen without evidence of obstructive uropathy, no hydronephrosis, no nephrolithiasis, no perinephric stranding----continue Flomax, follow-up with Dr. Luan Pulling for referral to urology as outpatient  2) acute COPD exacerbation----overall improved, continue prednisone  along with bronchodilators, mucolytics and, at baseline patient uses 6 L of oxygen continuously at home, O2 sats in the low to mid 90s here on 5 L of oxygen, c/n  supplemental oxygen as ordered  3)DM2-last A1c 8.7,  reflecting poor diabetic control, continue Lantus insulinalong with sliding scale coverage ,  okay to restart metformin   4) paroxysmal atrial fibrillation----... is currently in sinus rhythm, CHA2DS2- VASc score   is = 3 (dCHF, HTN, DM)   Which is  equal to =  3.2 % annual risk of stroke , patient states he was told not to be on anticoagulation will defer decision to PCP, continue Cardizem for rate control   Code Status : Full  Family Communication:   na   Disposition Plan  : home  Consults  :  na  Discharge Condition: stable  Follow UP--- Dr. Luan Pulling on 09/22/2018 for reevaluation and referral to urology  Diet and Activity recommendation:  As advised  Discharge Instructions    Discharge Instructions    Call MD for:  difficulty breathing, headache or visual disturbances   Complete by:  As directed    Call MD for:  persistant dizziness or light-headedness   Complete by:  As directed    Call MD for:  persistant nausea and vomiting   Complete by:  As directed    Call MD for:  severe uncontrolled pain   Complete by:  As directed    Call MD for:  temperature >100.4   Complete by:  As directed    Diet - low sodium heart healthy   Complete by:  As directed    Discharge instructions   Complete by:  As directed    1) take Flomax/tamsulosin 0.4 mg twice a day to help you with urination 2) have Dr. Luan Pulling refer you to a urologist to  further evaluate your problems with urination 3) you are taking multiple/different bronchodilators to help you breathe please review your breathing medications with Dr. 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 Luan Pulling   Increase activity slowly   Complete by:  As directed         Discharge Medications     Allergies as of 09/19/2018      Reactions   Albuterol Palpitations   Ciprofloxacin Other (See Comments)   Patient experiences vision loss from long-term and short-term use   Influenza Vaccine Live Swelling      Medication List    TAKE these medications   acetaminophen 325 MG tablet Commonly known as:  TYLENOL Take 2 tablets (650 mg total) by mouth every 6 (six) hours as needed for mild pain, moderate pain, fever or headache (or Fever >/= 101).   azithromycin 500 MG tablet Commonly known as:  ZITHROMAX 1 Tab daily per Dr Ivan Anchors medication Start taking on:  September 20, 2018 What changed:    how much to take  how to take this  when to take this  additional instructions   benzonatate 200 MG capsule Commonly known as:  TESSALON Take 200 mg by mouth 3 (three) times daily.   budesonide 0.25 MG/2ML nebulizer solution Commonly known as:  PULMICORT Take 2 mLs (0.25 mg total) by nebulization 2 (two) times daily.   Cartia XT 120 MG 24 hr capsule Generic drug:  diltiazem Take 1 capsule (120 mg total) by mouth daily.   diphenhydrAMINE 25 mg capsule Commonly known as:  BENADRYL Take 25 mg by mouth every 8 (eight) hours as needed. Felt mouth swelling at ED visit 08/16/18.   fexofenadine 180 MG tablet Commonly known as:  ALLEGRA Take 180 mg by mouth daily.   fluticasone 50 MCG/ACT nasal spray Commonly known as:  FLONASE Place 2 sprays into both nostrils daily as needed.   furosemide 40 MG tablet Commonly known as:  Lasix Take 1 tablet (40 mg total) by mouth daily. As needed for swelling What  changed:  additional instructions   insulin glargine 100 UNIT/ML injection Commonly known  as:  LANTUS Inject 0.4 mLs (40 Units total) into the skin daily. 24 units daily What changed:  how much to take   insulin lispro 100 UNIT/ML injection Commonly known as:  HumaLOG Take by sliding scale provided by hospital maximum amount daily is 60 units.  Check blood sugar 4 times a day What changed:    how much to take  how to take this  when to take this   ipratropium 0.02 % nebulizer solution Commonly known as:  ATROVENT Take 0.5 mg by nebulization 2 (two) times daily.   levalbuterol 0.63 MG/3ML nebulizer solution Commonly known as:  XOPENEX Take 0.63 mg by nebulization 2 (two) times daily.   LORazepam 0.5 MG tablet Commonly known as:  ATIVAN Take 1 tablet (0.5 mg total) by mouth 2 (two) times daily as needed for anxiety or sleep. What changed:    when to take this  additional instructions   magnesium 30 MG tablet Take 30 mg by mouth daily.   metFORMIN 500 MG tablet Commonly known as:  GLUCOPHAGE Take 1 tablet (500 mg total) by mouth daily with breakfast.   nitroGLYCERIN 0.4 MG SL tablet Commonly known as:  Nitrostat Place 1 tablet (0.4 mg total) under the tongue every 5 (five) minutes as needed for chest pain.   omeprazole 20 MG capsule Commonly known as:  PRILOSEC Take 1 capsule (20 mg total) by mouth 2 (two) times daily before a meal.   oxyCODONE-acetaminophen 10-325 MG tablet Commonly known as:  PERCOCET Take 1 tablet by mouth every 4 (four) hours as needed for pain.   potassium chloride SA 20 MEQ tablet Commonly known as:  K-DUR,KLOR-CON Take 1 tablet twice daily while on Lasix   predniSONE 10 MG tablet Commonly known as:  DELTASONE Take 2 daily for 1 week then 1 daily What changed:    how much to take  how to take this  when to take this  additional instructions   rifampin 300 MG capsule Commonly known as:  RIFADIN Take 2 capsules (600 mg total)  by mouth daily. Long-term medication What changed:  additional instructions   rosuvastatin 10 MG tablet Commonly known as:  CRESTOR Take 10 mg by mouth every morning.   tamsulosin 0.4 MG Caps capsule Commonly known as:  FLOMAX Take 1 capsule (0.4 mg total) by mouth 2 (two) times daily.   Trelegy Ellipta 100-62.5-25 MCG/INH Aepb Generic drug:  Fluticasone-Umeclidin-Vilant Inhale 1 puff into the lungs daily.   Ventolin HFA 108 (90 Base) MCG/ACT inhaler Generic drug:  albuterol Inhale 2 puffs into the lungs every 6 (six) hours as needed for wheezing or shortness of breath.      Ct Angio Chest Pe W/cm &/or Wo Cm  Result Date: 09/17/2018 CLINICAL DATA:  Right-sided chest pain with shortness of breath. Lower extremity edema. Abdominal pain. EXAM: CT ANGIOGRAPHY CHEST CT ABDOMEN AND PELVIS WITH CONTRAST TECHNIQUE: Multidetector CT imaging of the chest was performed using the standard protocol during bolus administration of intravenous contrast. Multiplanar CT image reconstructions and MIPs were obtained to evaluate the vascular anatomy. Multidetector CT imaging of the abdomen and pelvis was performed using the standard protocol during bolus administration of intravenous contrast. CONTRAST:  123mL OMNIPAQUE IOHEXOL 350 MG/ML SOLN COMPARISON:  CTA chest 06/19/2018 CT abdomen pelvis 08/12/2018 FINDINGS: CTA CHEST FINDINGS Cardiovascular: Contrast injection is sufficient to demonstrate satisfactory opacification of the pulmonary arteries to the segmental level. There is no pulmonary embolus. The main pulmonary artery is within normal limits for  size. There is no CT evidence of acute right heart strain. There is moderate calcific aortic atherosclerosis. No aortic aneurysm or dissection. Proximal arch vessels are widely patent. There is a normal 3-vessel arch branching pattern. Heart size is normal, without pericardial effusion. Mediastinum/Nodes: No mediastinal, hilar or axillary lymphadenopathy. The  visualized thyroid and thoracic esophageal course are unremarkable. Lungs/Pleura: There is bullous emphysema of both lungs. Chronic cavitation within the left upper and lower lobes is unchanged. No masses or pulmonary nodules. No pleural effusion or pneumothorax. Bibasilar atelectasis. Musculoskeletal: No chest wall abnormality. No acute or significant osseous findings. Review of the MIP images confirms the above findings. CT ABDOMEN and PELVIS FINDINGS Hepatobiliary: Normal hepatic contours and density. No visible biliary dilatation. Normal gallbladder. Pancreas: Normal contours without ductal dilatation. No peripancreatic fluid collection. Spleen: Normal. Adrenals/Urinary Tract: --Adrenal glands: Normal. --Right kidney/ureter: No hydronephrosis or perinephric stranding. No nephrolithiasis. No obstructing ureteral stones. --Left kidney/ureter: No hydronephrosis or perinephric stranding. No nephrolithiasis. No obstructing ureteral stones. --Urinary bladder: Markedly distended. Stomach/Bowel: --Stomach/Duodenum: No hiatal hernia or other gastric abnormality. Normal duodenal course and caliber. --Small bowel: No dilatation or inflammation. --Colon: No focal abnormality. --Appendix: Normal. Vascular/Lymphatic: Atherosclerotic calcification is present within the non-aneurysmal abdominal aorta, without hemodynamically significant stenosis. No abdominal or pelvic lymphadenopathy. Reproductive: Normal prostate and seminal vesicles. Musculoskeletal. No bony spinal canal stenosis or focal osseous abnormality. Other: None. IMPRESSION: 1. No pulmonary embolus or acute aortic syndrome. 2. Severe bullous emphysema with unchanged areas of cavitation in the left upper and lower lobes. 3. No acute abnormality of the abdomen or pelvis. 4. Markedly distended urinary bladder. Correlate for signs of urinary retention. 5. Aortic Atherosclerosis (ICD10-I70.0) and Emphysema (ICD10-J43.9). Electronically Signed   By: Ulyses Jarred M.D.    On: 09/17/2018 14:45   Ct Angio Chest Pe W Or Wo Contrast  Result Date: 09/02/2018 CLINICAL DATA:  Chronic dyspnea, shortness of breath, COPD, on oxygen therapy, difficulty breathing worse supine, worsening breathing since December 2019; history hypertension, type II diabetes mellitus, atrial fibrillation, collagen vascular disease, former smoker EXAM: CT ANGIOGRAPHY CHEST WITH CONTRAST TECHNIQUE: Multidetector CT imaging of the chest was performed using the standard protocol during bolus administration of intravenous contrast. Multiplanar CT image reconstructions and MIPs were obtained to evaluate the vascular anatomy. CONTRAST:  174mL ISOVUE-370 IOPAMIDOL (ISOVUE-370) INJECTION 76% IV COMPARISON:  06/19/2018, 02/12/2017 FINDINGS: Cardiovascular: Atherosclerotic calcifications of thoracic aorta and coronary arteries. Aorta normal caliber. No aneurysm or dissection. Pulmonary arteries well opacified and patent. No evidence of pulmonary embolism. No pericardial effusion. Mediastinum/Nodes: Esophagus unremarkable. Tiny hiatal hernia. Base of cervical region normal appearance. No thoracic adenopathy. Lungs/Pleura: Severe emphysematous changes with bullet the apices. LEFT upper lobe scarring. Again identified slightly thicker walled cavitary focus at LEFT apex unchanged, with an area of dependent density or nodularity along the posterior aspect unchanged since previous exam as well as an earlier study from 2018. Additional parenchymal scarring in LEFT lower lobe, likely resolving infected bulla with thinner wall and less fluid/thickening than on previous exam. Minimal dependent RIGHT basilar atelectasis. No acute infiltrate, pleural effusion or pneumothorax. No definite pulmonary mass/nodule. Upper Abdomen: Unremarkable Musculoskeletal: Demineralized, otherwise unremarkable Review of the MIP images confirms the above findings. IMPRESSION: No evidence pulmonary embolism. Scattered atherosclerotic calcifications aorta  and coronary arteries. COPD changes with bullous disease at apices and at RIGHT base with scattered areas of parenchymal scarring and atelectasis grossly stable since prior study. Chronic thickening of the walls of a bulla/cavitary focus at the LEFT apex  appears unchanged since 2018, recommend attention on follow-up imaging. Aortic Atherosclerosis (ICD10-I70.0) and Emphysema (ICD10-J43.9). Electronically Signed   By: Lavonia Dana M.D.   On: 09/02/2018 12:15   Ct Abdomen Pelvis W Contrast  Result Date: 09/17/2018 CLINICAL DATA:  Right-sided chest pain with shortness of breath. Lower extremity edema. Abdominal pain. EXAM: CT ANGIOGRAPHY CHEST CT ABDOMEN AND PELVIS WITH CONTRAST TECHNIQUE: Multidetector CT imaging of the chest was performed using the standard protocol during bolus administration of intravenous contrast. Multiplanar CT image reconstructions and MIPs were obtained to evaluate the vascular anatomy. Multidetector CT imaging of the abdomen and pelvis was performed using the standard protocol during bolus administration of intravenous contrast. CONTRAST:  14mL OMNIPAQUE IOHEXOL 350 MG/ML SOLN COMPARISON:  CTA chest 06/19/2018 CT abdomen pelvis 08/12/2018 FINDINGS: CTA CHEST FINDINGS Cardiovascular: Contrast injection is sufficient to demonstrate satisfactory opacification of the pulmonary arteries to the segmental level. There is no pulmonary embolus. The main pulmonary artery is within normal limits for size. There is no CT evidence of acute right heart strain. There is moderate calcific aortic atherosclerosis. No aortic aneurysm or dissection. Proximal arch vessels are widely patent. There is a normal 3-vessel arch branching pattern. Heart size is normal, without pericardial effusion. Mediastinum/Nodes: No mediastinal, hilar or axillary lymphadenopathy. The visualized thyroid and thoracic esophageal course are unremarkable. Lungs/Pleura: There is bullous emphysema of both lungs. Chronic cavitation  within the left upper and lower lobes is unchanged. No masses or pulmonary nodules. No pleural effusion or pneumothorax. Bibasilar atelectasis. Musculoskeletal: No chest wall abnormality. No acute or significant osseous findings. Review of the MIP images confirms the above findings. CT ABDOMEN and PELVIS FINDINGS Hepatobiliary: Normal hepatic contours and density. No visible biliary dilatation. Normal gallbladder. Pancreas: Normal contours without ductal dilatation. No peripancreatic fluid collection. Spleen: Normal. Adrenals/Urinary Tract: --Adrenal glands: Normal. --Right kidney/ureter: No hydronephrosis or perinephric stranding. No nephrolithiasis. No obstructing ureteral stones. --Left kidney/ureter: No hydronephrosis or perinephric stranding. No nephrolithiasis. No obstructing ureteral stones. --Urinary bladder: Markedly distended. Stomach/Bowel: --Stomach/Duodenum: No hiatal hernia or other gastric abnormality. Normal duodenal course and caliber. --Small bowel: No dilatation or inflammation. --Colon: No focal abnormality. --Appendix: Normal. Vascular/Lymphatic: Atherosclerotic calcification is present within the non-aneurysmal abdominal aorta, without hemodynamically significant stenosis. No abdominal or pelvic lymphadenopathy. Reproductive: Normal prostate and seminal vesicles. Musculoskeletal. No bony spinal canal stenosis or focal osseous abnormality. Other: None. IMPRESSION: 1. No pulmonary embolus or acute aortic syndrome. 2. Severe bullous emphysema with unchanged areas of cavitation in the left upper and lower lobes. 3. No acute abnormality of the abdomen or pelvis. 4. Markedly distended urinary bladder. Correlate for signs of urinary retention. 5. Aortic Atherosclerosis (ICD10-I70.0) and Emphysema (ICD10-J43.9). Electronically Signed   By: Ulyses Jarred M.D.   On: 09/17/2018 14:45   Dg Chest Port 1 View  Result Date: 09/17/2018 CLINICAL DATA:  Difficulty breathing and cough EXAM: PORTABLE CHEST 1  VIEW COMPARISON:  August 26, 2018 FINDINGS: The heart size and mediastinal contours are within normal limits. Patchy consolidation of right lung base is identified. There is no pulmonary edema or pleural effusion. The visualized skeletal structures are stable. IMPRESSION: Patchy consolidation of medial right lung base suspicious for pneumonia. Electronically Signed   By: Abelardo Diesel M.D.   On: 09/17/2018 13:41   Dg Chest Port 1 View  Result Date: 08/26/2018 CLINICAL DATA:  Shortness of breath EXAM: PORTABLE CHEST 1 VIEW COMPARISON:  08/16/2018 FINDINGS: Cardiac shadows within normal limits. The lungs are well aerated bilaterally. Emphysematous changes are  again seen. Increasing left basilar infiltrate is noted. Superimposed over chronic changes in the bases. No acute bony abnormality is noted. IMPRESSION: Acute left basilar infiltrate superimposed over chronic changes in the bases Electronically Signed   By: Inez Catalina M.D.   On: 08/26/2018 19:50   Dg Foot Complete Right  Result Date: 09/05/2018 CLINICAL DATA:  Right foot pain and swelling. EXAM: RIGHT FOOT COMPLETE - 3+ VIEW COMPARISON:  05/01/2015 FINDINGS: Soft tissue swelling noted at the midfoot. No underlying fracture or dislocation evident. No suspicious lytic or sclerotic osseous abnormality. No evidence for radiopaque soft tissue foreign body. IMPRESSION: Soft tissue swelling without acute bony abnormality. Electronically Signed   By: Misty Stanley M.D.   On: 09/05/2018 14:18    Micro Results   Recent Results (from the past 240 hour(s))  Urine culture     Status: None   Collection Time: 09/17/18  1:07 PM  Result Value Ref Range Status   Specimen Description   Final    URINE, CATHETERIZED Performed at [facility], [address] [place], [place] [address]    Special Requests   Final    NONE Performed at [facility], [address] [place], [place] [address]    Culture   Final    NO GROWTH Performed at [place], [place] [address] [place], [place] [address]    Report Status 09/19/2018 FINAL  Final       Today   Subjective    Ryan [name] today has no new concerns, voiding much better, no dysuria or hematuria          Patient has been seen and examined prior to discharge   Objective   Blood pressure 113/72, pulse 79, temperature 98.3 F (36.8 C), temperature source Oral, resp. rate 20, height 5\' 8"  (1.727 m), weight 76 kg, SpO2 94 %.   Intake/Output Summary (Last 24 hours) at 09/19/2018 1214 Last data filed at 09/19/2018 0735 Gross per 24 hour  Intake 1320 ml  Output 1875 ml  Net -555 ml    Exam  Gen:- Awake Alert,  In no apparent distress   HEENT:- [place].AT, No sclera icterus Eyes--- patient is legally blind Nose- [place] 5 L/min Neck-Supple Neck,No JVD,.  Lungs-patient bases, few scattered wheezes  CV- S1, S2 normal, regular (history of PA. Fib) Abd-  +ve B.Sounds, Abd Soft, No tenderness,    Extremity/Skin:- No  edema, pedal pulses present  Psych-affect is appropriate, oriented x3 Neuro-no new focal deficits, no tremors   Data Review   CBC w Diff:  Lab Results  Component Value Date   WBC 7.1 09/18/2018   HGB 10.6 (L) 09/18/2018   HCT 34.0 (L) 09/18/2018   PLT 142 (L) 09/18/2018   LYMPHOPCT 22 09/17/2018   BANDSPCT 2 07/25/2015   MONOPCT 10 09/17/2018   EOSPCT 7 09/17/2018   BASOPCT 1 09/17/2018    CMP:  Lab Results  Component Value Date   NA 134 (L) 09/18/2018   K 3.9 09/18/2018   CL 95 (L) 09/18/2018   CO2 29 09/18/2018   BUN 12 09/18/2018   CREATININE 0.83 09/18/2018   CREATININE 0.94 12/30/2017   PROT 7.0 09/17/2018   ALBUMIN 3.2 (L) 09/17/2018   BILITOT 0.3 09/17/2018   ALKPHOS 55 09/17/2018   AST 21 09/17/2018   ALT 47 (H) 09/17/2018  .   Total Discharge time is about 33 minutes  Roxan Hockey M.D on 09/19/2018 at 12:14 PM  Go to [email or web address] -  for contact info  [facility] - Office  [phone]

## 2018-09-19 NOTE — Progress Notes (Signed)
Nsg Discharge Note  Admit Date:  09/17/2018 Discharge date: 09/19/2018   Ryan [name] to be D/C'd Home per MD order.  AVS completed.  Copy for chart, and copy for patient signed, and dated. Patient/caregiver able to verbalize understanding.  Discharge Medication: Allergies as of 09/19/2018      Reactions   Albuterol Palpitations   Ciprofloxacin Other (See Comments)   Patient experiences vision loss from long-term and short-term use   Influenza Vaccine Live Swelling      Medication List    TAKE these medications   acetaminophen 325 MG tablet Commonly known as:  TYLENOL Take 2 tablets (650 mg total) by mouth every 6 (six) hours as needed for mild pain, moderate pain, fever or headache (or Fever >/= 101).   azithromycin 500 MG tablet Commonly known as:  ZITHROMAX 1 Tab daily per Dr Ivan Anchors medication Start taking on:  September 20, 2018 What changed:    how much to take  how to take this  when to take this  additional instructions   benzonatate 200 MG capsule Commonly known as:  TESSALON Take 200 mg by mouth 3 (three) times daily.   budesonide 0.25 MG/2ML nebulizer solution Commonly known as:  PULMICORT Take 2 mLs (0.25 mg total) by nebulization 2 (two) times daily.   Cartia XT 120 MG 24 hr capsule Generic drug:  diltiazem Take 1 capsule (120 mg total) by mouth daily.   diphenhydrAMINE 25 mg capsule Commonly known as:  BENADRYL Take 25 mg by mouth every 8 (eight) hours as needed. Felt mouth swelling at ED visit 08/16/18.   fexofenadine 180 MG tablet Commonly known as:  ALLEGRA Take 180 mg by mouth daily.   fluticasone 50 MCG/ACT nasal spray Commonly known as:  FLONASE Place 2 sprays into both nostrils daily as needed.   furosemide 40 MG tablet Commonly known as:  Lasix Take 1 tablet (40 mg total) by mouth daily. As needed for swelling What changed:  additional instructions   insulin glargine 100 UNIT/ML injection Commonly known as:  LANTUS Inject 0.4  mLs (40 Units total) into the skin daily. 24 units daily What changed:  how much to take   insulin lispro 100 UNIT/ML injection Commonly known as:  HumaLOG Take by sliding scale provided by hospital maximum amount daily is 60 units.  Check blood sugar 4 times a day What changed:    how much to take  how to take this  when to take this   ipratropium 0.02 % nebulizer solution Commonly known as:  ATROVENT Take 0.5 mg by nebulization 2 (two) times daily.   levalbuterol 0.63 MG/3ML nebulizer solution Commonly known as:  XOPENEX Take 0.63 mg by nebulization 2 (two) times daily.   LORazepam 0.5 MG tablet Commonly known as:  ATIVAN Take 1 tablet (0.5 mg total) by mouth 2 (two) times daily as needed for anxiety or sleep. What changed:    when to take this  additional instructions   magnesium 30 MG tablet Take 30 mg by mouth daily.   metFORMIN 500 MG tablet Commonly known as:  GLUCOPHAGE Take 1 tablet (500 mg total) by mouth daily with breakfast.   nitroGLYCERIN 0.4 MG SL tablet Commonly known as:  Nitrostat Place 1 tablet (0.4 mg total) under the tongue every 5 (five) minutes as needed for chest pain.   omeprazole 20 MG capsule Commonly known as:  PRILOSEC Take 1 capsule (20 mg total) by mouth 2 (two) times daily before a meal.  oxyCODONE-acetaminophen 10-325 MG tablet Commonly known as:  PERCOCET Take 1 tablet by mouth every 4 (four) hours as needed for pain.   potassium chloride SA 20 MEQ tablet Commonly known as:  K-DUR,KLOR-CON Take 1 tablet twice daily while on Lasix   predniSONE 10 MG tablet Commonly known as:  DELTASONE Take 2 daily for 1 week then 1 daily What changed:    how much to take  how to take this  when to take this  additional instructions   rifampin 300 MG capsule Commonly known as:  RIFADIN Take 2 capsules (600 mg total) by mouth daily. Long-term medication What changed:  additional instructions   rosuvastatin 10 MG tablet Commonly  known as:  CRESTOR Take 10 mg by mouth every morning.   tamsulosin 0.4 MG Caps capsule Commonly known as:  FLOMAX Take 1 capsule (0.4 mg total) by mouth 2 (two) times daily.   Trelegy Ellipta 100-62.5-25 MCG/INH Aepb Generic drug:  Fluticasone-Umeclidin-Vilant Inhale 1 puff into the lungs daily.   Ventolin HFA 108 (90 Base) MCG/ACT inhaler Generic drug:  albuterol Inhale 2 puffs into the lungs every 6 (six) hours as needed for wheezing or shortness of breath.       Discharge Assessment: Vitals:   09/19/18 0736 09/19/18 1002  BP:    Pulse:    Resp:    Temp:    SpO2: 96% 94%   Skin clean, dry and intact without evidence of skin break down, no evidence of skin tears noted. IV catheter discontinued intact. Site without signs and symptoms of complications - no redness or edema noted at insertion site, patient denies c/o pain - only slight tenderness at site.  Dressing with slight pressure applied.  D/c Instructions-Education: Discharge instructions given to patient/family with verbalized understanding. D/c education completed with patient/family including follow up instructions, medication list, d/c activities limitations if indicated, with other d/c instructions as indicated by MD - patient able to verbalize understanding, all questions fully answered. Patient instructed to return to ED, call 911, or call MD for any changes in condition.  Patient escorted via [place], and D/C home via private auto.  Loa Socks, RN 09/19/2018 12:29 PM

## 2018-09-20 ENCOUNTER — Ambulatory Visit: Payer: Medicare Other

## 2018-09-20 IMAGING — DX DG CHEST 2V
2 series · 2 of 2 positions shown · non-contrast
Comparison: CT 02/12/2017, radiographs [DATE] [DATE], 05/30/2016

CLINICAL DATA: Shortness of breath, productive cough

EXAM:
CHEST  2 VIEW

[chest pa]
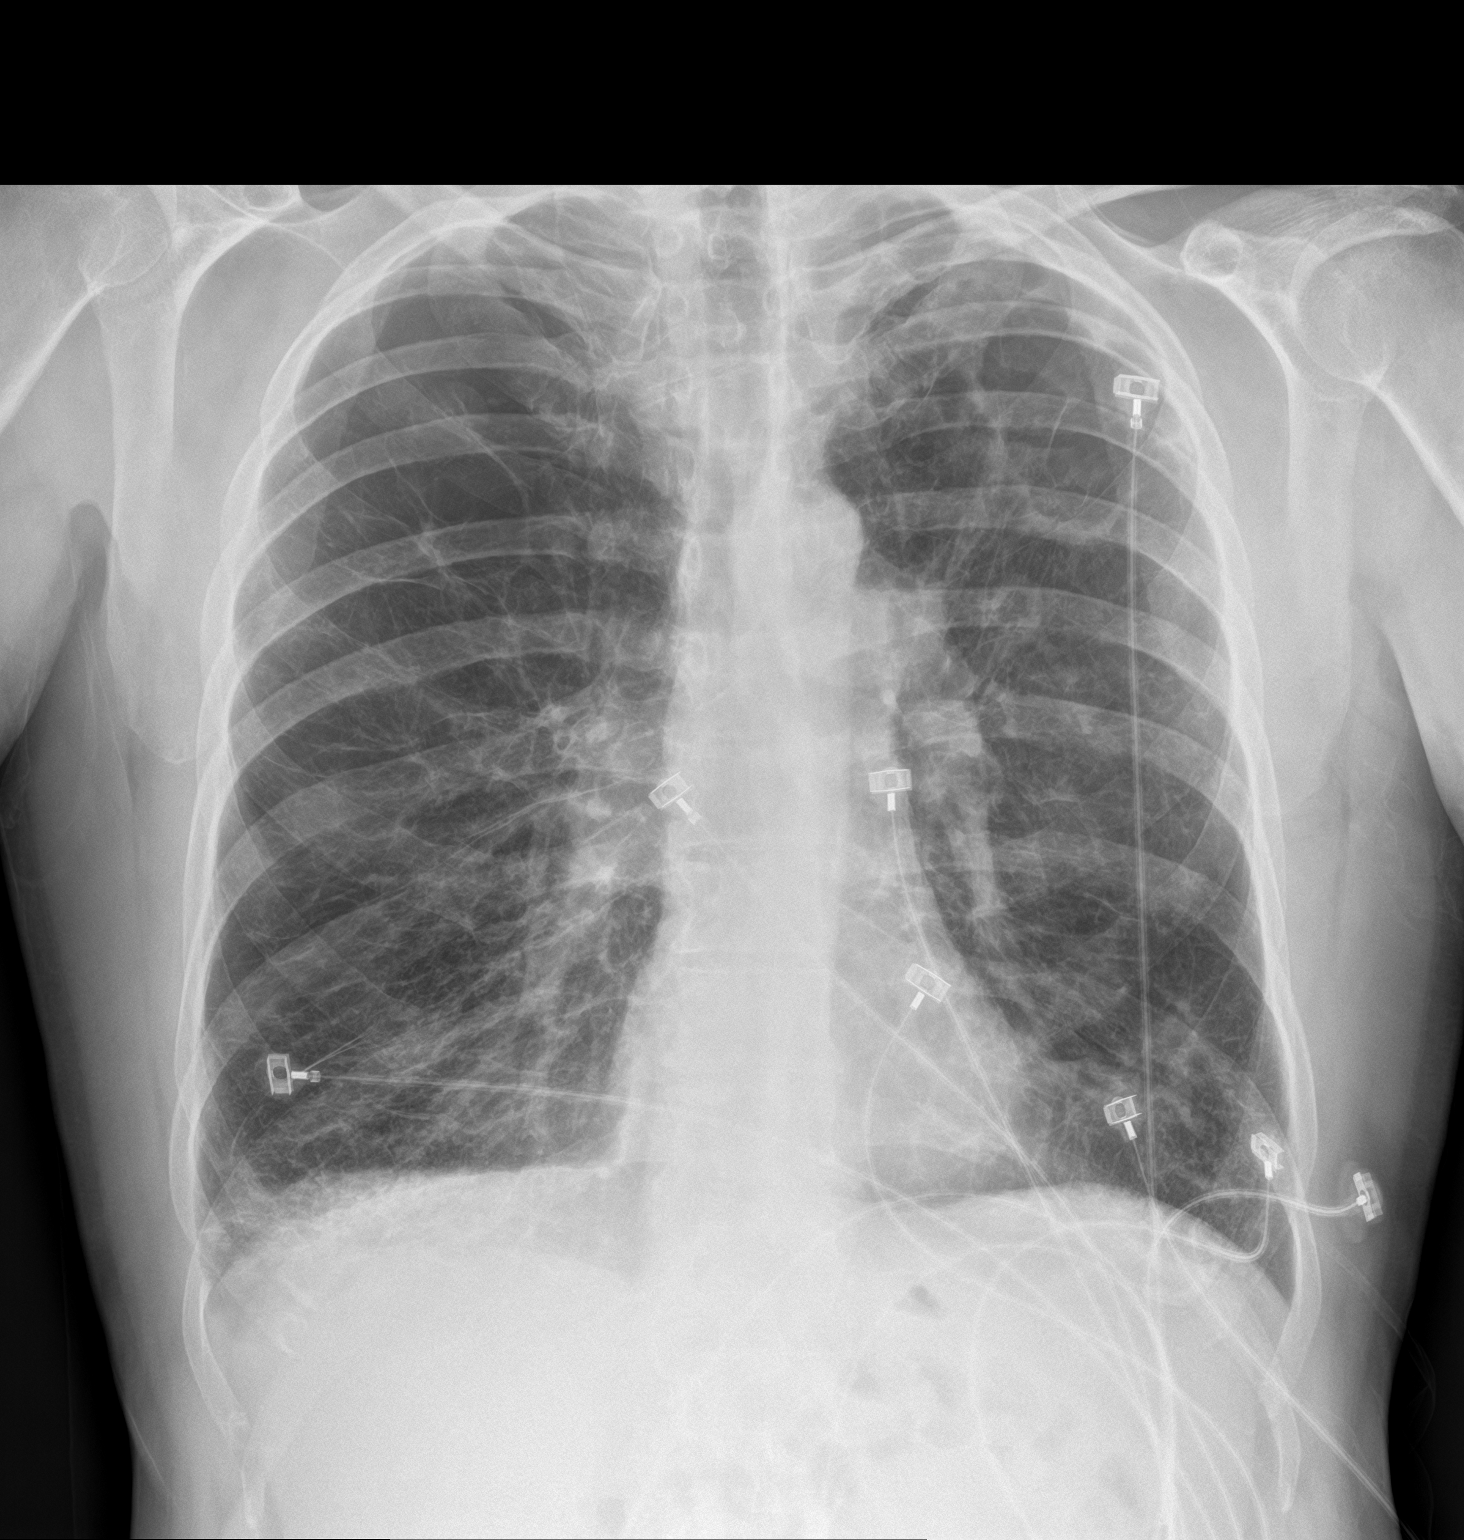

[chest lat]
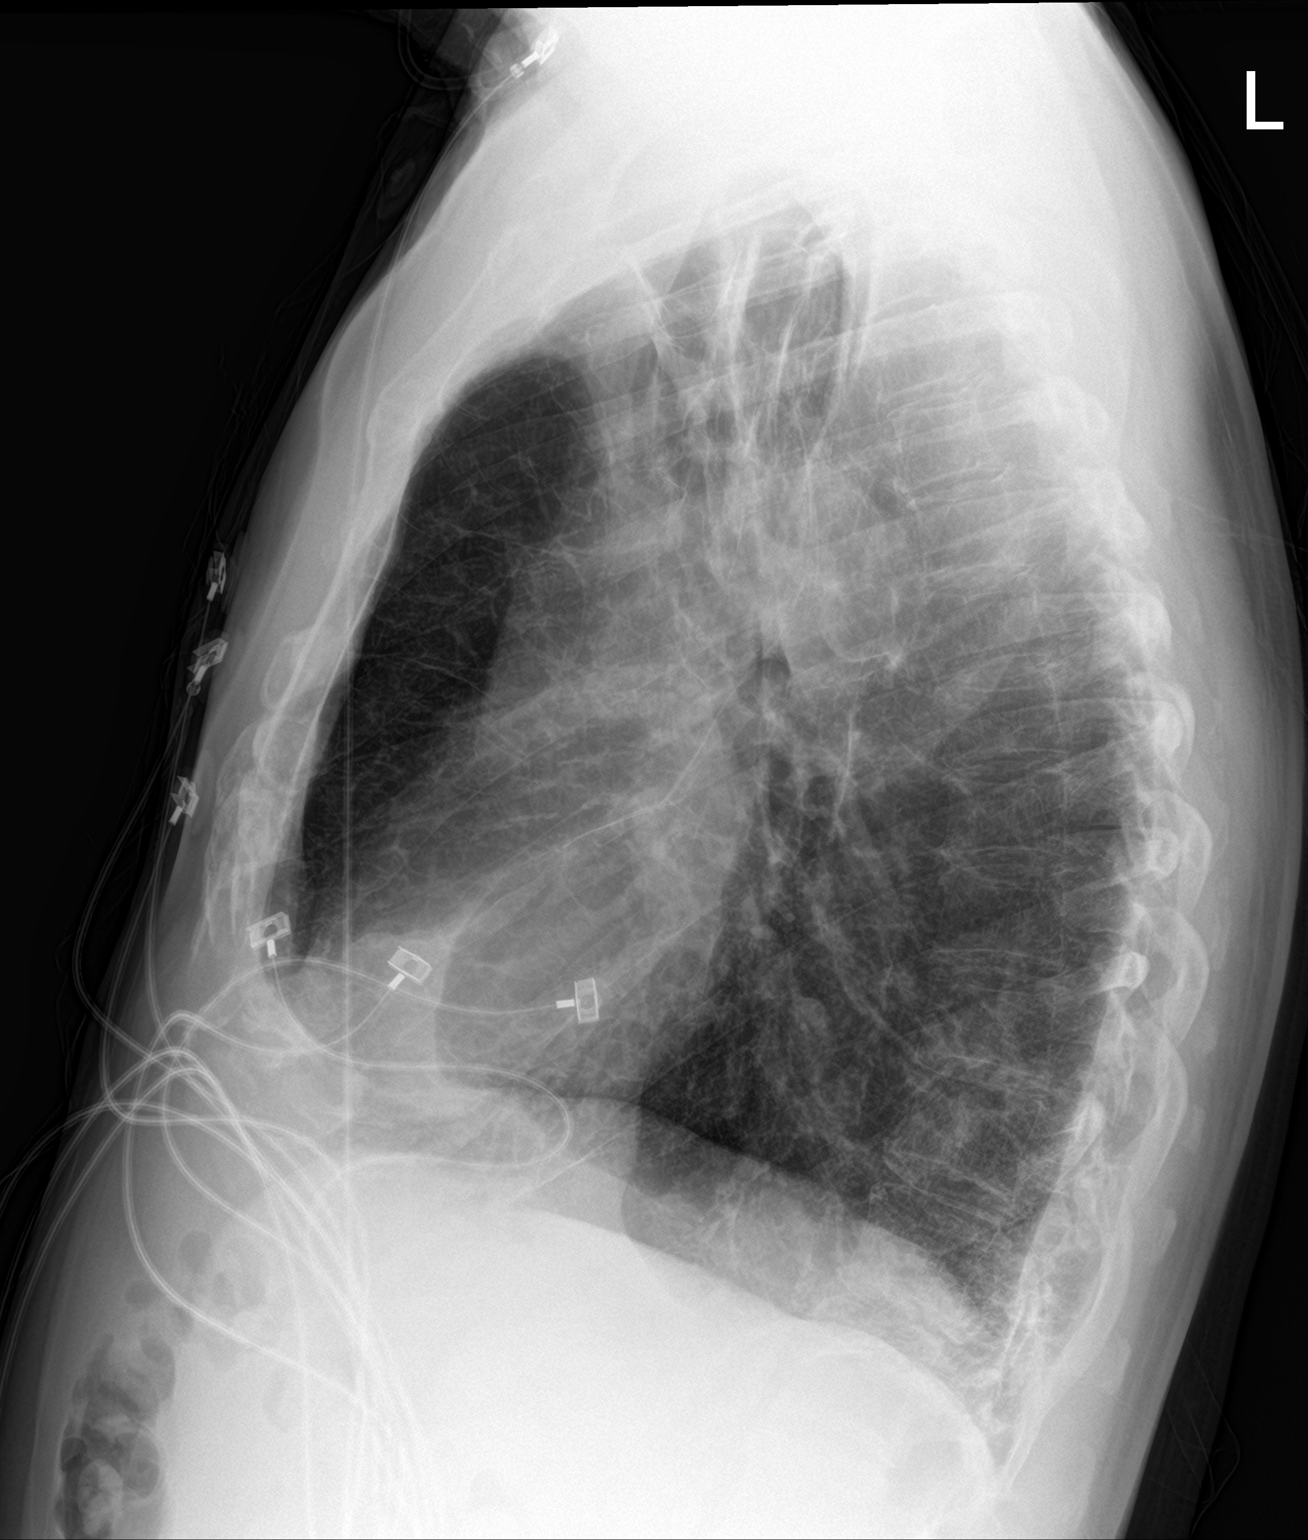

[2 of 2 positions shown; findings below may reference images not displayed]

FINDINGS: Hyperinflation with re- demonstrated extensive bullous emphysematous
disease. A large cyst or cavity in the left upper lobe remains. No
acute consolidation or effusion is seen. Stable scarring at the lung
bases. Stable cardiomediastinal silhouette. No pneumothorax
IMPRESSION: No active cardiopulmonary disease. Hyperinflation with severe
emphysematous bullous changes and scarring at the bases

## 2018-09-21 DIAGNOSIS — J189 Pneumonia, unspecified organism: Secondary | ICD-10-CM | POA: Diagnosis not present

## 2018-09-21 DIAGNOSIS — Z5181 Encounter for therapeutic drug level monitoring: Secondary | ICD-10-CM | POA: Diagnosis not present

## 2018-09-21 DIAGNOSIS — J440 Chronic obstructive pulmonary disease with acute lower respiratory infection: Secondary | ICD-10-CM | POA: Diagnosis not present

## 2018-09-21 DIAGNOSIS — J9621 Acute and chronic respiratory failure with hypoxia: Secondary | ICD-10-CM | POA: Diagnosis not present

## 2018-09-21 DIAGNOSIS — J441 Chronic obstructive pulmonary disease with (acute) exacerbation: Secondary | ICD-10-CM | POA: Diagnosis not present

## 2018-09-21 DIAGNOSIS — Z794 Long term (current) use of insulin: Secondary | ICD-10-CM | POA: Diagnosis not present

## 2018-09-21 DIAGNOSIS — I5033 Acute on chronic diastolic (congestive) heart failure: Secondary | ICD-10-CM | POA: Diagnosis not present

## 2018-09-21 DIAGNOSIS — Z7951 Long term (current) use of inhaled steroids: Secondary | ICD-10-CM | POA: Diagnosis not present

## 2018-09-21 DIAGNOSIS — E119 Type 2 diabetes mellitus without complications: Secondary | ICD-10-CM | POA: Diagnosis not present

## 2018-09-21 DIAGNOSIS — J8410 Pulmonary fibrosis, unspecified: Secondary | ICD-10-CM | POA: Diagnosis not present

## 2018-09-21 DIAGNOSIS — Z9981 Dependence on supplemental oxygen: Secondary | ICD-10-CM | POA: Diagnosis not present

## 2018-09-22 DIAGNOSIS — J9611 Chronic respiratory failure with hypoxia: Secondary | ICD-10-CM | POA: Diagnosis not present

## 2018-09-22 DIAGNOSIS — J449 Chronic obstructive pulmonary disease, unspecified: Secondary | ICD-10-CM | POA: Diagnosis not present

## 2018-09-22 DIAGNOSIS — E119 Type 2 diabetes mellitus without complications: Secondary | ICD-10-CM | POA: Diagnosis not present

## 2018-09-22 DIAGNOSIS — R339 Retention of urine, unspecified: Secondary | ICD-10-CM | POA: Diagnosis not present

## 2018-09-24 ENCOUNTER — Encounter (HOSPITAL_COMMUNITY): Payer: Self-pay | Admitting: Emergency Medicine

## 2018-09-24 ENCOUNTER — Other Ambulatory Visit: Payer: Self-pay

## 2018-09-24 ENCOUNTER — Emergency Department (HOSPITAL_COMMUNITY)
Admission: EM | Admit: 2018-09-24 | Discharge: 2018-09-24 | Disposition: A | Discharge status: Home / Self Care | Payer: Medicare Other | Attending: Emergency Medicine | Admitting: Emergency Medicine

## 2018-09-24 DIAGNOSIS — R338 Other retention of urine: Secondary | ICD-10-CM | POA: Diagnosis not present

## 2018-09-24 DIAGNOSIS — Z79899 Other long term (current) drug therapy: Secondary | ICD-10-CM | POA: Insufficient documentation

## 2018-09-24 DIAGNOSIS — I5032 Chronic diastolic (congestive) heart failure: Secondary | ICD-10-CM | POA: Diagnosis not present

## 2018-09-24 DIAGNOSIS — I110 Hypertensive heart disease with heart failure: Secondary | ICD-10-CM | POA: Diagnosis not present

## 2018-09-24 DIAGNOSIS — E119 Type 2 diabetes mellitus without complications: Secondary | ICD-10-CM | POA: Insufficient documentation

## 2018-09-24 DIAGNOSIS — R000 Tachycardia, unspecified: Secondary | ICD-10-CM | POA: Diagnosis not present

## 2018-09-24 DIAGNOSIS — R0602 Shortness of breath: Secondary | ICD-10-CM | POA: Diagnosis not present

## 2018-09-24 DIAGNOSIS — Z794 Long term (current) use of insulin: Secondary | ICD-10-CM | POA: Insufficient documentation

## 2018-09-24 DIAGNOSIS — R339 Retention of urine, unspecified: Secondary | ICD-10-CM | POA: Diagnosis not present

## 2018-09-24 DIAGNOSIS — Z87891 Personal history of nicotine dependence: Secondary | ICD-10-CM | POA: Diagnosis not present

## 2018-09-24 LAB — BASIC METABOLIC PANEL
Anion gap: 11 (ref 5–15)
BUN: 12 mg/dL (ref 6–20)
CHLORIDE: 97 mmol/L — AB (ref 98–111)
CO2: 26 mmol/L (ref 22–32)
CREATININE: 0.98 mg/dL (ref 0.61–1.24)
Calcium: 8.9 mg/dL (ref 8.9–10.3)
GFR calc Af Amer: >60 mL/min (ref >60)
GFR calc non Af Amer: >60 mL/min (ref >60)
Glucose, Bld: 310 mg/dL — ABNORMAL HIGH (ref 70–99)
Potassium: 4.5 mmol/L (ref 3.5–5.1)
SODIUM: 134 mmol/L — AB (ref 135–145)

## 2018-09-24 LAB — URINALYSIS, ROUTINE W REFLEX MICROSCOPIC
BILIRUBIN URINE: NEGATIVE
Glucose, UA: >=500 mg/dL — AB
Ketones, ur: NEGATIVE mg/dL
Leukocytes,Ua: NEGATIVE
NITRITE: NEGATIVE
Protein, ur: NEGATIVE mg/dL
SPECIFIC GRAVITY, URINE: 1.025 (ref 1.005–1.030)
pH: 5 (ref 5.0–8.0)

## 2018-09-24 LAB — DIFFERENTIAL
Basophils Absolute: 0.1 10*3/uL (ref 0.0–0.1)
Basophils Relative: 1 %
Eosinophils Absolute: 0.2 10*3/uL (ref 0.0–0.5)
Eosinophils Relative: 2 %
Lymphocytes Relative: 15 %
Lymphs Abs: 1.3 10*3/uL (ref 0.7–4.0)
Monocytes Absolute: 0.7 10*3/uL (ref 0.1–1.0)
Monocytes Relative: 8 %
Neutro Abs: 6.3 10*3/uL (ref 1.7–7.7)
Neutrophils Relative %: 72 %

## 2018-09-24 LAB — HEPATIC FUNCTION PANEL
ALT: 44 U/L (ref 0–44)
AST: 22 U/L (ref 15–41)
Albumin: 3.2 g/dL — ABNORMAL LOW (ref 3.5–5.0)
Alkaline Phosphatase: 55 U/L (ref 38–126)
Bilirubin, Direct: <0.1 mg/dL (ref 0.0–0.2)
Total Bilirubin: 0.2 mg/dL — ABNORMAL LOW (ref 0.3–1.2)
Total Protein: 7 g/dL (ref 6.5–8.1)

## 2018-09-24 LAB — CBC
HCT: 39.3 % (ref 39.0–52.0)
Hemoglobin: 12.3 g/dL — ABNORMAL LOW (ref 13.0–17.0)
MCH: 27.4 pg (ref 26.0–34.0)
MCHC: 31.3 g/dL (ref 30.0–36.0)
MCV: 87.5 fL (ref 80.0–100.0)
Platelets: 274 10*3/uL (ref 150–400)
RBC: 4.49 MIL/uL (ref 4.22–5.81)
RDW: 14.3 % (ref 11.5–15.5)
WBC: 8.9 10*3/uL (ref 4.0–10.5)
nRBC: 0 % (ref 0.0–0.2)

## 2018-09-24 NOTE — ED Provider Notes (Signed)
[facility] Provider Note   CSN: [id number] Arrival date & time: 09/24/18  1341    History   Chief Complaint Chief Complaint  Patient presents with  . Shortness of Breath    HPI Ryan [name] is a 58 y.o. male.     Pt states he cannot urinate for two days  The history is provided by the patient.  Abdominal Pain  Pain location:  Suprapubic Pain quality: aching   Pain radiates to:  Does not radiate Pain severity:  Moderate Onset quality:  Sudden Timing:  Constant Progression:  Worsening Chronicity:  New Context: not alcohol use   Relieved by:  Nothing Worsened by:  Nothing Associated symptoms: no chest pain, no cough, no diarrhea, no fatigue and no hematuria     Past Medical History:  Diagnosis Date  . Atrial fibrillation ([place])    Not anticoagulated  . Barrett's esophagus   . Borderline diabetes   . Bronchitis   . CAP (community acquired pneumonia) 07/10/2013   04/2015  . 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 Chronic back pain   . Chronic diastolic CHF (congestive heart failure) ([place])   . Chronic left shoulder pain   . Chronic neck pain   . Chronic respiratory failure ([place])   . Collagen vascular disease ([place])   . COPD (chronic obstructive pulmonary disease) ([place])   . Coronary atherosclerosis of native coronary artery    Mild nonobstructive disease at catheterization 2007  . DDD (degenerative disc disease)    Cervical and thoracic  . Diastolic heart failure ([place])   . GERD (gastroesophageal reflux disease)   . History of pneumonia   . Hypercholesteremia   . On home O2    6L N/C  . Polycythemia   . Seizures ([place])    Last seizure 2 yrs ago  . Smoker   . Type 2 diabetes mellitus ([place])   . Type 2 diabetes mellitus without complication [facility])     Patient Active Problem List   Diagnosis Date Noted  . Urinary retention 09/17/2018  . Chronic pain 09/17/2018  . Anxiety 09/17/2018  . Influenza due  to identified novel influenza A virus with other respiratory manifestations 09/05/2018  . HCAP (healthcare-associated pneumonia) 08/26/2018  . GERD (gastroesophageal reflux disease) 08/04/2018  . Chest pain 06/20/2018  . Left-sided chest pain 06/19/2018  . Dysphagia 04/26/2018  . Acute on chronic respiratory failure with hypoxia ([place]) 04/20/2018  . Vision disorder 12/30/2017  . Umbilical hernia 81/27/5170  . Chronic diastolic CHF (congestive heart failure) ([place]) 10/24/2017  . Lower extremity edema 10/17/2017  . MAI (mycobacterium avium-intracellulare) ([place]) 08/29/2015  . Anemia of chronic disease   . Elevated liver enzymes   . Absolute anemia   . Pulmonary fibrosis ([place]) 05/10/2015  . Chronic respiratory failure with hypoxia ([place]) 05/10/2015  . Pulmonary nodule   . Pain, generalized 05/01/2015  . Laceration 05/01/2015  . PAF (paroxysmal atrial fibrillation) ([place])   . COPD exacerbation ([place])   . Protein-calorie malnutrition, severe ([place]) 10/21/2013  . Hemoptysis 10/20/2013  . Atrial fibrillation with rapid ventricular response ([place]) 07/10/2013  . Rotator cuff syndrome of left shoulder 01/20/2012  . Spondylosis, cervical 11/19/2011  . 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 Cavitary lesion of lung 05/07/2011  . GERD with stricture   . ASCVD (arteriosclerotic cardiovascular disease)   . Type 2 diabetes mellitus without complication ([place])   . TOBACCO ABUSE 05/17/2010  . Asthma  01/20/2008  . Hyperlipidemia 12/27/2007  . 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 COLONOSCOPY  2012   Dr. Posey Pronto: normal  . ESOPHAGOGASTRODUODENOSCOPY N/A 05/04/2015   Dr. Michail Sermon: minimal erosive esophagitis, small hiatal hernia. FOOD PRECLUDED A COMPLETE EXAM. No biopsies taken  . ESOPHAGOGASTRODUODENOSCOPY  2011   [place]: Barrett's   . ESOPHAGOGASTRODUODENOSCOPY (EGD) WITH PROPOFOL N/A 05/06/2018   Dr.  Gala Romney: erosive reflux esophagitis and +candida, Schatzki's ring s/p dilation, hiatal hernia.  [name] LUNG BIOPSY    . MALONEY DILATION N/A 05/06/2018   Procedure: Venia Minks DILATION;  Surgeon: Daneil Dolin, MD;  Location: [facility];  Service: Endoscopy;  Laterality: N/A;  . Throat biopsy    . VASECTOMY    . VASECTOMY  1987        Home Medications    Prior to Admission medications   Medication Sig Start Date End Date Taking? Authorizing Provider  acetaminophen (TYLENOL) 325 MG tablet Take 2 tablets (650 mg total) by mouth every 6 (six) hours as needed for mild pain, moderate pain, fever or headache (or Fever >/= 101). 09/19/18   Roxan Hockey, MD  albuterol (VENTOLIN HFA) 108 (90 BASE) MCG/ACT inhaler Inhale 2 puffs into the lungs every 6 (six) hours as needed for wheezing or shortness of breath.    [provider]  azithromycin (ZITHROMAX) 500 MG tablet 1 Tab daily per Dr Ivan Anchors medication 09/20/18   Roxan Hockey, MD  benzonatate (TESSALON) 200 MG capsule Take 200 mg by mouth 3 (three) times daily.    [provider]  budesonide (PULMICORT) 0.25 MG/2ML nebulizer solution Take 2 mLs (0.25 mg total) by nebulization 2 (two) times daily. 10/24/17   [name], MD  CARTIA XT 120 MG 24 hr capsule Take 1 capsule (120 mg total) by mouth daily. 01/01/18   Arnoldo Lenis, MD  diphenhydrAMINE (BENADRYL) 25 mg capsule Take 25 mg by mouth every 8 (eight) hours as needed. Felt mouth swelling at ED visit 08/16/18.    [provider]  fexofenadine (ALLEGRA) 180 MG tablet Take 180 mg by mouth daily.    [provider]  fluticasone (FLONASE) 50 MCG/ACT nasal spray Place 2 sprays into both nostrils daily as needed.  02/24/18   [provider]  Fluticasone-Umeclidin-Vilant (TRELEGY ELLIPTA) 100-62.5-25 MCG/INH AEPB Inhale 1 puff into the lungs daily.    [provider]  furosemide (LASIX) 40 MG tablet Take 1 tablet (40 mg total) by mouth  daily. As needed for swelling Patient taking differently: Take 40 mg by mouth daily. As needed for swelling Taking 1 tablet by mouth twice daily. 09/10/18 09/10/19  [name], MD  insulin glargine (LANTUS) 100 UNIT/ML injection Inject 0.4 mLs (40 Units total) into the skin daily. 24 units daily 09/19/18   Roxan Hockey, MD  insulin lispro (HUMALOG) 100 UNIT/ML injection Take by sliding scale provided by hospital maximum amount daily is 60 units.  Check blood sugar 4 times a day Patient taking differently: Inject 0-15 Units into the skin 3 (three) times daily with meals. Take by sliding scale provided by hospital maximum amount daily is 60 units.  Check blood sugar 4 times a day 09/23/17   [name], MD  ipratropium (ATROVENT) 0.02 % nebulizer solution Take 0.5 mg by nebulization 2 (two) times daily.  09/29/17   [provider]  levalbuterol Penne Lash) 0.63 MG/3ML nebulizer solution Take 0.63 mg by  nebulization 2 (two) times daily.    [provider]  LORazepam (ATIVAN) 0.5 MG tablet Take 1 tablet (0.5 mg total) by mouth 2 (two) times daily as needed for anxiety or sleep. Patient taking differently: Take 0.5 mg by mouth 4 (four) times daily. Pt takes it three times daily. 05/04/15   Mikhail, Velta Addison, DO  magnesium 30 MG tablet Take 30 mg by mouth daily.    [provider]  metFORMIN (GLUCOPHAGE) 500 MG tablet Take 1 tablet (500 mg total) by mouth daily with breakfast. 09/15/14   Rexene Alberts, MD  nitroGLYCERIN (NITROSTAT) 0.4 MG SL tablet Place 1 tablet (0.4 mg total) under the tongue every 5 (five) minutes as needed for chest pain. 04/22/13   Lendon Colonel, NP  omeprazole (PRILOSEC) 20 MG capsule Take 1 capsule (20 mg total) by mouth 2 (two) times daily before a meal. 08/04/18   Annitta Needs, NP  oxyCODONE-acetaminophen (PERCOCET) 10-325 MG tablet Take 1 tablet by mouth every 4 (four) hours as needed for pain.    [provider]  potassium chloride SA  (K-DUR,KLOR-CON) 20 MEQ tablet Take 1 tablet twice daily while on Lasix 09/10/18   [name], MD  predniSONE (DELTASONE) 10 MG tablet Take 2 daily for 1 week then 1 daily Patient taking differently: Take 20 mg by mouth daily with breakfast. Takes 2 tablets daily. 10/24/17   [name], MD  rifampin (RIFADIN) 300 MG capsule Take 2 capsules (600 mg total) by mouth daily. Long-term medication 09/19/18   Roxan Hockey, MD  rosuvastatin (CRESTOR) 10 MG tablet Take 10 mg by mouth every morning.  04/10/18   [provider]  tamsulosin (FLOMAX) 0.4 MG CAPS capsule Take 1 capsule (0.4 mg total) by mouth 2 (two) times daily. 09/19/18   Roxan Hockey, MD    Family History Family History  Problem Relation Age of Onset  . Hypertension Mother   . Diabetes Mother   . Heart attack Mother   . Heart failure Mother   . Hypertension Sister   . Diabetes Sister   . Heart failure Sister   . Colon cancer Neg Hx     Social History Social History   Tobacco Use  . Smoking status: Former Smoker    Packs/day: 0.50    Years: 45.00    Pack years: 22.50    Types: Cigarettes    Last attempt to quit: 05/01/2015    Years since quitting: 3.4  . Smokeless tobacco: Never Used  Substance Use Topics  . Alcohol use: No    Alcohol/week: 0.0 standard drinks  . Drug use: No     Allergies   Albuterol; Ciprofloxacin; and Influenza vaccine live   Review of Systems Review of Systems  Constitutional: Negative for appetite change and fatigue.  HENT: Negative for congestion, ear discharge and sinus pressure.   Eyes: Negative for discharge.  Respiratory: Negative for cough.   Cardiovascular: Negative for chest pain.  Gastrointestinal: Positive for abdominal pain. Negative for diarrhea.  Genitourinary: Negative for frequency and hematuria.       Urinary retension  Musculoskeletal: Negative for back pain.  Skin: Negative for rash.  Neurological: Negative for seizures and headaches.   Psychiatric/Behavioral: Negative for hallucinations.     Physical Exam Updated Vital Signs BP 112/81   Pulse (!) 103   Temp 97.8 F (36.6 C) (Oral)   Resp (!) 22   Ht 5\' 8"  (1.727 m)   Wt 77 kg   SpO2 94%  BMI 25.81 kg/m   Physical Exam Vitals signs and nursing note reviewed.  Constitutional:      Appearance: He is well-developed.  HENT:     Head: Normocephalic.     Nose: Nose normal.  Eyes:     General: No scleral icterus.    Conjunctiva/sclera: Conjunctivae normal.  Neck:     Musculoskeletal: Neck supple.     Thyroid: No thyromegaly.  Cardiovascular:     Rate and Rhythm: Normal rate and regular rhythm.     Heart sounds: No murmur. No friction rub. No gallop.   Pulmonary:     Breath sounds: No stridor. No wheezing or rales.  Chest:     Chest wall: No tenderness.  Abdominal:     General: There is distension.     Tenderness: There is abdominal tenderness. There is no rebound.  Musculoskeletal: Normal range of motion.  Lymphadenopathy:     Cervical: No cervical adenopathy.  Skin:    Findings: No erythema or rash.  Neurological:     Mental Status: He is oriented to person, place, and time.     Motor: No abnormal muscle tone.     Coordination: Coordination normal.  Psychiatric:        Behavior: Behavior normal.      ED Treatments / Results  Labs (all labs ordered are listed, but only abnormal results are displayed) Labs Reviewed  BASIC METABOLIC PANEL - Abnormal; Notable for the following components:      Result Value   Sodium 134 (*)    Chloride 97 (*)    Glucose, Bld 310 (*)    All other components within normal limits  CBC - Abnormal; Notable for the following components:   Hemoglobin 12.3 (*)    All other components within normal limits  HEPATIC FUNCTION PANEL - Abnormal; Notable for the following components:   Albumin 3.2 (*)    Total Bilirubin 0.2 (*)    All other components within normal limits  URINALYSIS, ROUTINE W REFLEX MICROSCOPIC -  Abnormal; Notable for the following components:   Glucose, UA >=500 (*)    Hgb urine dipstick SMALL (*)    Bacteria, UA RARE (*)    All other components within normal limits  DIFFERENTIAL    EKG EKG Interpretation  Date/Time:  Friday September 24 2018 13:59:42 EDT Ventricular Rate:  109 PR Interval:    QRS Duration: 85 QT Interval:  333 QTC Calculation: 449 R Axis:   49 Text Interpretation:  Sinus tachycardia Abnormal R-wave progression, early transition Nonspecific T abnormalities, lateral leads Confirmed by Milton Ferguson [phone]) on 09/24/2018 5:51:40 PM   Radiology No results found.  Procedures Procedures (including critical care time)  Medications Ordered in ED Medications - No data to display   Initial Impression / Assessment and Plan / ED Course  I have reviewed the triage vital signs and the nursing notes.  Pertinent labs & imaging results that were available during my care of the patient were reviewed by me and considered in my medical decision making (see chart for details).       Pt with urinary retention.  Foley placed with 1300 cc drained.   Pt will be sent home with a foley and urology referral Final Clinical Impressions(s) / ED Diagnoses   Final diagnoses:  Acute urinary retention    ED Discharge Orders    None       Milton Ferguson, MD 09/24/18 1800

## 2018-09-24 NOTE — Patient Outreach (Signed)
[facility] [place] [facility]) Care Management  09/24/2018  Ryan [name] 06-Jun-1961 [id number]   EMMI-General Discharge RED ON EMMI ALERT Day #1 Date:09/12/2018 [place] Reason:"Scheduled follow up appt? No"    Multiple attempts to establish contact with patient without success. No response from letter mailed to patient. Case is being closed at this time.     Plan: RN CM will close case at this time.   [name], RN,BSN,CCM [facility] Telephonic Care Management Coordinator Direct Phone: [phone] Toll Free: [phone] Fax: [phone]

## 2018-09-24 NOTE — Discharge Instructions (Signed)
Follow up with [facility] next week °

## 2018-09-24 NOTE — ED Notes (Signed)
Pt states he is normally on 6L of O2 at home. Pt states he has had to use his inhaler more over the last few days.

## 2018-09-24 NOTE — ED Notes (Signed)
636ml bladder scan.

## 2018-09-24 NOTE — ED Triage Notes (Signed)
Discharged last Friday for SOB Decreased urination  Here today for same   Reports called his physician who sent him here  Former smoker Home O2, 6L

## 2018-09-25 DIAGNOSIS — Z5181 Encounter for therapeutic drug level monitoring: Secondary | ICD-10-CM | POA: Diagnosis not present

## 2018-09-25 DIAGNOSIS — Z7951 Long term (current) use of inhaled steroids: Secondary | ICD-10-CM | POA: Diagnosis not present

## 2018-09-25 DIAGNOSIS — Z794 Long term (current) use of insulin: Secondary | ICD-10-CM | POA: Diagnosis not present

## 2018-09-25 DIAGNOSIS — J189 Pneumonia, unspecified organism: Secondary | ICD-10-CM | POA: Diagnosis not present

## 2018-09-25 DIAGNOSIS — E119 Type 2 diabetes mellitus without complications: Secondary | ICD-10-CM | POA: Diagnosis not present

## 2018-09-25 DIAGNOSIS — I5033 Acute on chronic diastolic (congestive) heart failure: Secondary | ICD-10-CM | POA: Diagnosis not present

## 2018-09-25 DIAGNOSIS — J440 Chronic obstructive pulmonary disease with acute lower respiratory infection: Secondary | ICD-10-CM | POA: Diagnosis not present

## 2018-09-25 DIAGNOSIS — Z9981 Dependence on supplemental oxygen: Secondary | ICD-10-CM | POA: Diagnosis not present

## 2018-09-25 DIAGNOSIS — J8410 Pulmonary fibrosis, unspecified: Secondary | ICD-10-CM | POA: Diagnosis not present

## 2018-09-25 DIAGNOSIS — J9621 Acute and chronic respiratory failure with hypoxia: Secondary | ICD-10-CM | POA: Diagnosis not present

## 2018-09-25 DIAGNOSIS — J441 Chronic obstructive pulmonary disease with (acute) exacerbation: Secondary | ICD-10-CM | POA: Diagnosis not present

## 2018-09-28 DIAGNOSIS — J441 Chronic obstructive pulmonary disease with (acute) exacerbation: Secondary | ICD-10-CM | POA: Diagnosis not present

## 2018-09-28 DIAGNOSIS — Z9981 Dependence on supplemental oxygen: Secondary | ICD-10-CM | POA: Diagnosis not present

## 2018-09-28 DIAGNOSIS — Z5181 Encounter for therapeutic drug level monitoring: Secondary | ICD-10-CM | POA: Diagnosis not present

## 2018-09-28 DIAGNOSIS — Z794 Long term (current) use of insulin: Secondary | ICD-10-CM | POA: Diagnosis not present

## 2018-09-28 DIAGNOSIS — Z7951 Long term (current) use of inhaled steroids: Secondary | ICD-10-CM | POA: Diagnosis not present

## 2018-09-28 DIAGNOSIS — I5033 Acute on chronic diastolic (congestive) heart failure: Secondary | ICD-10-CM | POA: Diagnosis not present

## 2018-09-28 DIAGNOSIS — E119 Type 2 diabetes mellitus without complications: Secondary | ICD-10-CM | POA: Diagnosis not present

## 2018-09-28 DIAGNOSIS — J189 Pneumonia, unspecified organism: Secondary | ICD-10-CM | POA: Diagnosis not present

## 2018-09-28 DIAGNOSIS — J8410 Pulmonary fibrosis, unspecified: Secondary | ICD-10-CM | POA: Diagnosis not present

## 2018-09-28 DIAGNOSIS — J9621 Acute and chronic respiratory failure with hypoxia: Secondary | ICD-10-CM | POA: Diagnosis not present

## 2018-09-28 DIAGNOSIS — J440 Chronic obstructive pulmonary disease with acute lower respiratory infection: Secondary | ICD-10-CM | POA: Diagnosis not present

## 2018-09-30 DIAGNOSIS — Z794 Long term (current) use of insulin: Secondary | ICD-10-CM | POA: Diagnosis not present

## 2018-09-30 DIAGNOSIS — E119 Type 2 diabetes mellitus without complications: Secondary | ICD-10-CM | POA: Diagnosis not present

## 2018-09-30 DIAGNOSIS — J440 Chronic obstructive pulmonary disease with acute lower respiratory infection: Secondary | ICD-10-CM | POA: Diagnosis not present

## 2018-09-30 DIAGNOSIS — I5033 Acute on chronic diastolic (congestive) heart failure: Secondary | ICD-10-CM | POA: Diagnosis not present

## 2018-09-30 DIAGNOSIS — J441 Chronic obstructive pulmonary disease with (acute) exacerbation: Secondary | ICD-10-CM | POA: Diagnosis not present

## 2018-09-30 DIAGNOSIS — Z9981 Dependence on supplemental oxygen: Secondary | ICD-10-CM | POA: Diagnosis not present

## 2018-09-30 DIAGNOSIS — Z7951 Long term (current) use of inhaled steroids: Secondary | ICD-10-CM | POA: Diagnosis not present

## 2018-09-30 DIAGNOSIS — J9621 Acute and chronic respiratory failure with hypoxia: Secondary | ICD-10-CM | POA: Diagnosis not present

## 2018-09-30 DIAGNOSIS — J8410 Pulmonary fibrosis, unspecified: Secondary | ICD-10-CM | POA: Diagnosis not present

## 2018-09-30 DIAGNOSIS — Z5181 Encounter for therapeutic drug level monitoring: Secondary | ICD-10-CM | POA: Diagnosis not present

## 2018-09-30 DIAGNOSIS — J189 Pneumonia, unspecified organism: Secondary | ICD-10-CM | POA: Diagnosis not present

## 2018-10-01 DIAGNOSIS — E119 Type 2 diabetes mellitus without complications: Secondary | ICD-10-CM | POA: Diagnosis not present

## 2018-10-05 DIAGNOSIS — R338 Other retention of urine: Secondary | ICD-10-CM | POA: Diagnosis not present

## 2018-10-06 ENCOUNTER — Ambulatory Visit: Payer: Medicare Other | Admitting: Internal Medicine

## 2018-10-06 ENCOUNTER — Encounter: Payer: Self-pay | Admitting: Internal Medicine

## 2018-10-06 ENCOUNTER — Other Ambulatory Visit: Payer: Self-pay

## 2018-10-06 VITALS — Ht 68.0 in | Wt 170.0 lb

## 2018-10-06 DIAGNOSIS — J984 Other disorders of lung: Secondary | ICD-10-CM | POA: Diagnosis not present

## 2018-10-06 DIAGNOSIS — A310 Pulmonary mycobacterial infection: Secondary | ICD-10-CM

## 2018-10-06 DIAGNOSIS — F17200 Nicotine dependence, unspecified, uncomplicated: Secondary | ICD-10-CM

## 2018-10-07 ENCOUNTER — Other Ambulatory Visit: Payer: Self-pay

## 2018-10-07 ENCOUNTER — Other Ambulatory Visit (HOSPITAL_COMMUNITY)
Admission: RE | Admit: 2018-10-07 | Discharge: 2018-10-07 | Disposition: A | Discharge status: Home / Self Care | Payer: Medicare Other | Source: Ambulatory Visit | Attending: Internal Medicine | Admitting: Internal Medicine

## 2018-10-07 DIAGNOSIS — J984 Other disorders of lung: Secondary | ICD-10-CM

## 2018-10-07 DIAGNOSIS — J8410 Pulmonary fibrosis, unspecified: Secondary | ICD-10-CM | POA: Diagnosis not present

## 2018-10-07 DIAGNOSIS — J441 Chronic obstructive pulmonary disease with (acute) exacerbation: Secondary | ICD-10-CM | POA: Diagnosis not present

## 2018-10-07 DIAGNOSIS — A310 Pulmonary mycobacterial infection: Secondary | ICD-10-CM | POA: Insufficient documentation

## 2018-10-07 DIAGNOSIS — Z7951 Long term (current) use of inhaled steroids: Secondary | ICD-10-CM | POA: Diagnosis not present

## 2018-10-07 DIAGNOSIS — E119 Type 2 diabetes mellitus without complications: Secondary | ICD-10-CM | POA: Diagnosis not present

## 2018-10-07 DIAGNOSIS — Z9981 Dependence on supplemental oxygen: Secondary | ICD-10-CM | POA: Diagnosis not present

## 2018-10-07 DIAGNOSIS — J189 Pneumonia, unspecified organism: Secondary | ICD-10-CM | POA: Diagnosis not present

## 2018-10-07 DIAGNOSIS — Z794 Long term (current) use of insulin: Secondary | ICD-10-CM | POA: Diagnosis not present

## 2018-10-07 DIAGNOSIS — J9621 Acute and chronic respiratory failure with hypoxia: Secondary | ICD-10-CM | POA: Diagnosis not present

## 2018-10-07 DIAGNOSIS — J440 Chronic obstructive pulmonary disease with acute lower respiratory infection: Secondary | ICD-10-CM | POA: Diagnosis not present

## 2018-10-07 DIAGNOSIS — I5033 Acute on chronic diastolic (congestive) heart failure: Secondary | ICD-10-CM | POA: Diagnosis not present

## 2018-10-07 DIAGNOSIS — Z5181 Encounter for therapeutic drug level monitoring: Secondary | ICD-10-CM | POA: Diagnosis not present

## 2018-10-07 NOTE — Assessment & Plan Note (Signed)
He is only on 2 drug therapy at this time due to possible side effect of ethambutol, though he was having eye issues prior to this.   I am going to recheck his sputum and will need to add something else if it is persistent.  I would favor starting IV amikacin 3 times per week vs clofazamine.

## 2018-10-07 NOTE — Assessment & Plan Note (Signed)
Remains tobacco free and I congratulated him on this.

## 2018-10-07 NOTE — Assessment & Plan Note (Signed)
He is going to continue with daily therapy due to cavitary lesions.

## 2018-10-07 NOTE — Progress Notes (Signed)
   Subjective:    Patient ID: Ryan [name], male    DOB: 11/23/1960, 58 y.o.   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  I saw him in May and then June 2019 by referral from Dr. Luan Pulling for treatment recommendations with his history of potential eye issues with cipro.  I started him on rifampin, ethambutol and azithromycin.  He started to have some eye issues and went to his ophthalmologist who felt it was related to the ethambutol, so this was stopped.  He continue on two drug daily therapy for his cavitary lesion. His recent CT during a hospitalization was unchanged for the cavitary lesions.   Overall he feels like he has had some clinical improvement.  No night sweats, continued sputum but not worse.  He has been hospitalized 4 times since I last saw him.     Review of Systems  Constitutional: Negative for fever.  Gastrointestinal: Negative for diarrhea and nausea.  Skin: Negative for rash.       Objective:   Physical Exam Constitutional:      Appearance: Normal appearance.  Eyes:     General: No scleral icterus. Cardiovascular:     Rate and Rhythm: Normal rate and regular rhythm.  Pulmonary:     Effort: Pulmonary effort is normal.     Breath sounds: No wheezing.     Comments: Distant breath sounds.  Skin:    Findings: No rash.  Neurological:     Mental Status: He is alert.   SH: no tobacco for 4 years        Assessment & Plan:

## 2018-10-08 LAB — ACID FAST SMEAR (AFB, MYCOBACTERIA): Acid Fast Smear: NEGATIVE

## 2018-10-12 DIAGNOSIS — J449 Chronic obstructive pulmonary disease, unspecified: Secondary | ICD-10-CM | POA: Diagnosis not present

## 2018-10-12 DIAGNOSIS — A310 Pulmonary mycobacterial infection: Secondary | ICD-10-CM | POA: Diagnosis not present

## 2018-10-15 DIAGNOSIS — J189 Pneumonia, unspecified organism: Secondary | ICD-10-CM | POA: Diagnosis not present

## 2018-10-15 DIAGNOSIS — J9621 Acute and chronic respiratory failure with hypoxia: Secondary | ICD-10-CM | POA: Diagnosis not present

## 2018-10-15 DIAGNOSIS — Z5181 Encounter for therapeutic drug level monitoring: Secondary | ICD-10-CM | POA: Diagnosis not present

## 2018-10-15 DIAGNOSIS — J441 Chronic obstructive pulmonary disease with (acute) exacerbation: Secondary | ICD-10-CM | POA: Diagnosis not present

## 2018-10-15 DIAGNOSIS — E119 Type 2 diabetes mellitus without complications: Secondary | ICD-10-CM | POA: Diagnosis not present

## 2018-10-15 DIAGNOSIS — J440 Chronic obstructive pulmonary disease with acute lower respiratory infection: Secondary | ICD-10-CM | POA: Diagnosis not present

## 2018-10-15 DIAGNOSIS — I5033 Acute on chronic diastolic (congestive) heart failure: Secondary | ICD-10-CM | POA: Diagnosis not present

## 2018-10-15 DIAGNOSIS — J8410 Pulmonary fibrosis, unspecified: Secondary | ICD-10-CM | POA: Diagnosis not present

## 2018-10-15 DIAGNOSIS — Z9981 Dependence on supplemental oxygen: Secondary | ICD-10-CM | POA: Diagnosis not present

## 2018-10-15 DIAGNOSIS — Z7951 Long term (current) use of inhaled steroids: Secondary | ICD-10-CM | POA: Diagnosis not present

## 2018-10-15 DIAGNOSIS — Z794 Long term (current) use of insulin: Secondary | ICD-10-CM | POA: Diagnosis not present

## 2018-10-23 DIAGNOSIS — J449 Chronic obstructive pulmonary disease, unspecified: Secondary | ICD-10-CM | POA: Diagnosis not present

## 2018-10-26 DIAGNOSIS — J449 Chronic obstructive pulmonary disease, unspecified: Secondary | ICD-10-CM | POA: Diagnosis not present

## 2018-10-26 DIAGNOSIS — J9611 Chronic respiratory failure with hypoxia: Secondary | ICD-10-CM | POA: Diagnosis not present

## 2018-10-26 DIAGNOSIS — E1165 Type 2 diabetes mellitus with hyperglycemia: Secondary | ICD-10-CM | POA: Diagnosis not present

## 2018-10-26 DIAGNOSIS — A318 Other mycobacterial infections: Secondary | ICD-10-CM | POA: Diagnosis not present

## 2018-11-19 IMAGING — CT CT CHEST W/O CM
2 of 3 series · 15 of 36 positions shown, 18 images · non-contrast
Comparison: Multiple exams, including 02/12/2017

CLINICAL DATA: Pneumonia. Pulmonary lesion follow up. Shortness of
breath. COPD. Emphysema.

EXAM:
CT CHEST WITHOUT CONTRAST
TECHNIQUE: Multidetector CT imaging of the chest was performed following the
standard protocol without IV contrast.

[Series 2: thorax · axial · 0.65mm/px · z∈[+1281,+1549]mm · 12 of 158 slices shown, 15 images]
[im 12/158  mediastinal]
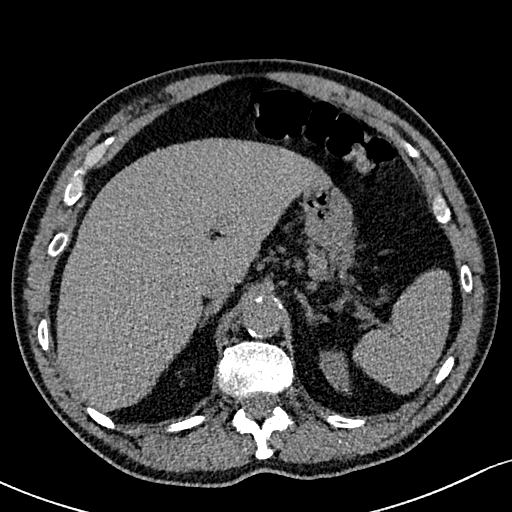
[im 12/158  lung]
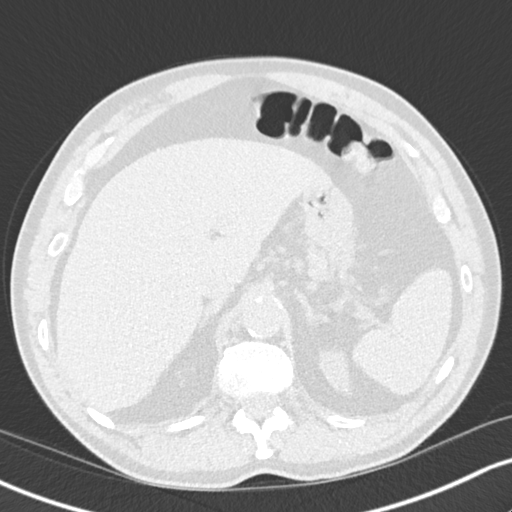
[im 24/158  lung]
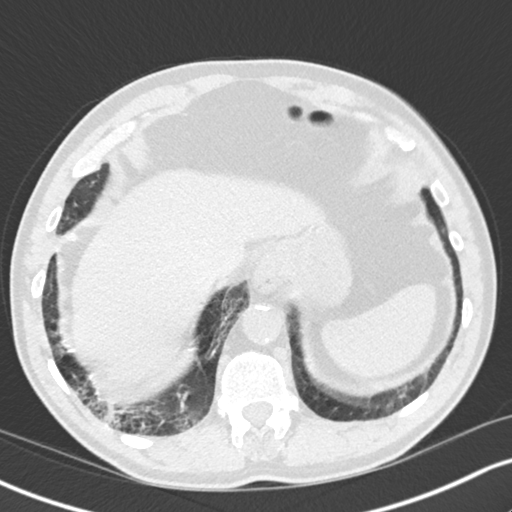
[im 35/158  lung]
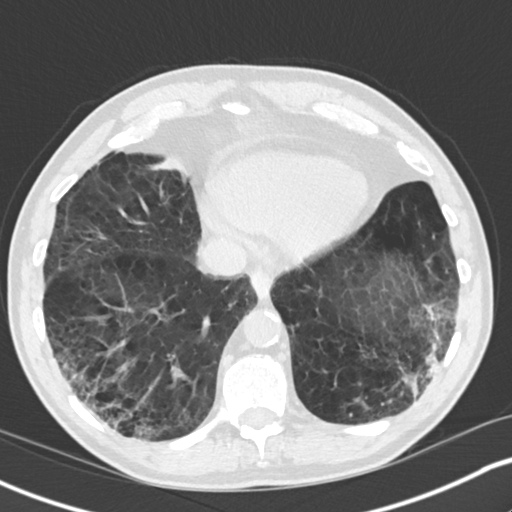
[im 47/158  lung]
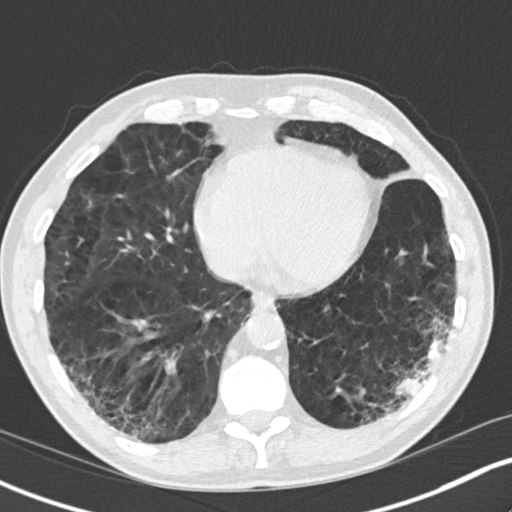
[im 59/158  mediastinal]
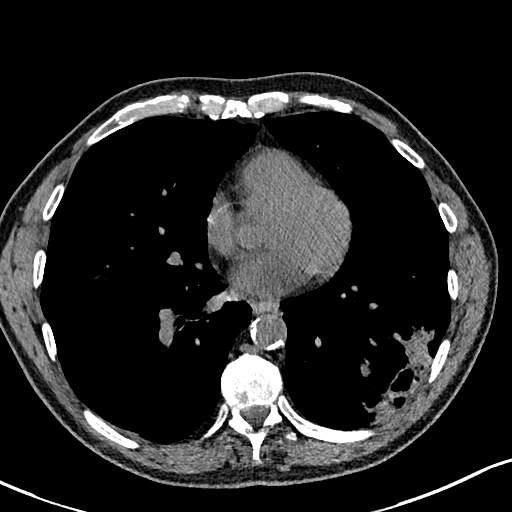
[im 59/158  lung]
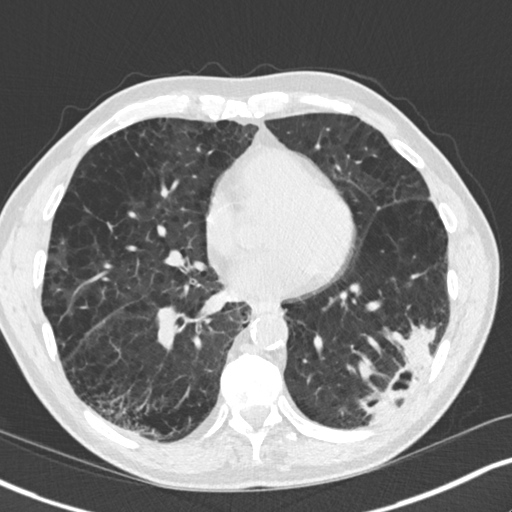
[im 70/158  lung]
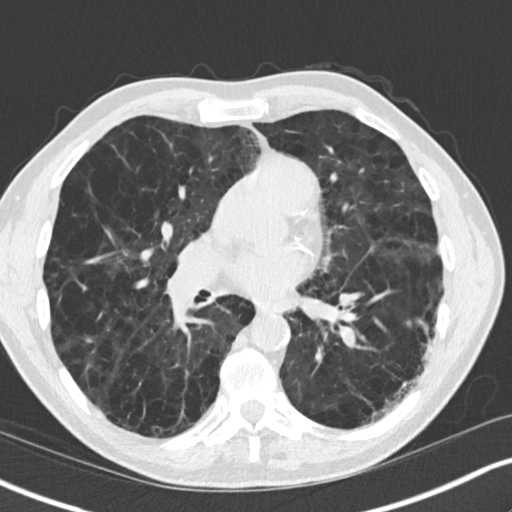
[im 88/158  lung]
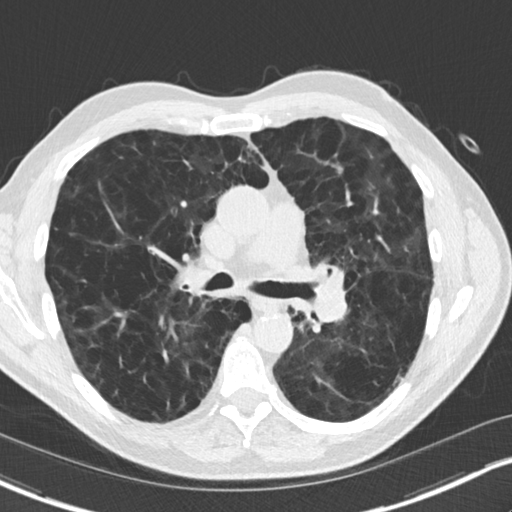
[im 99/158  lung]
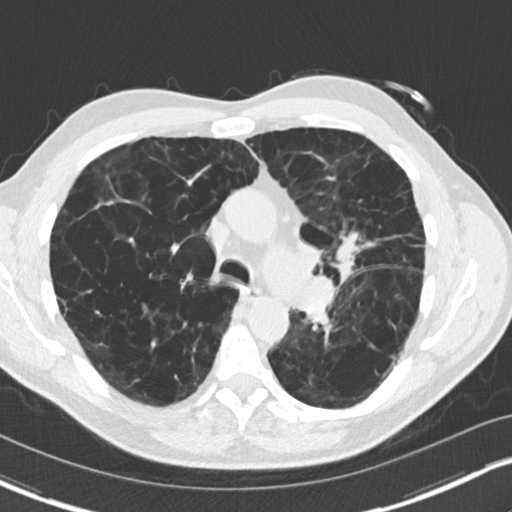
[im 111/158  mediastinal]
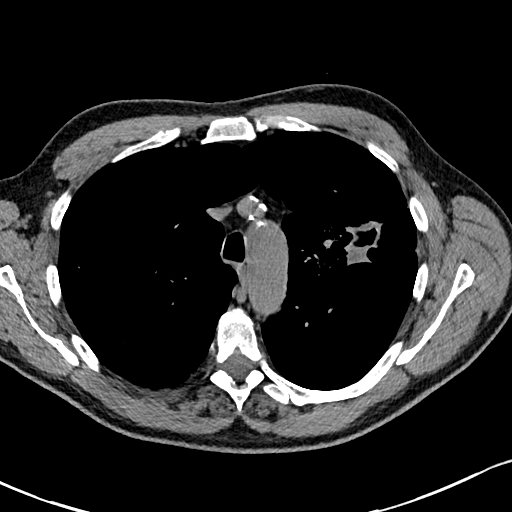
[im 111/158  lung]
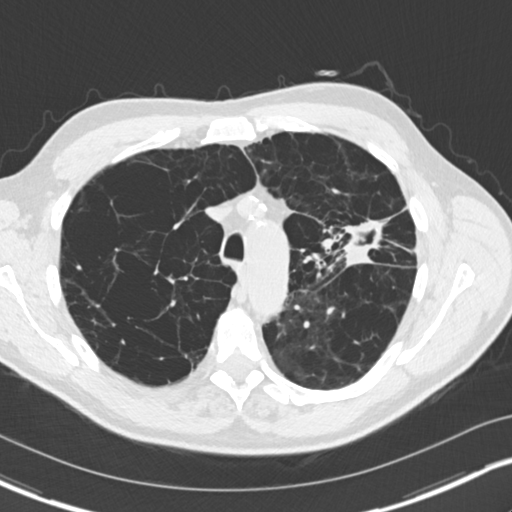
[im 123/158  lung]
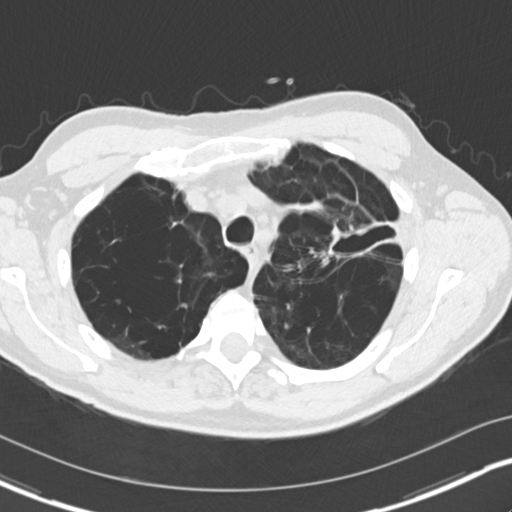
[im 134/158  lung]
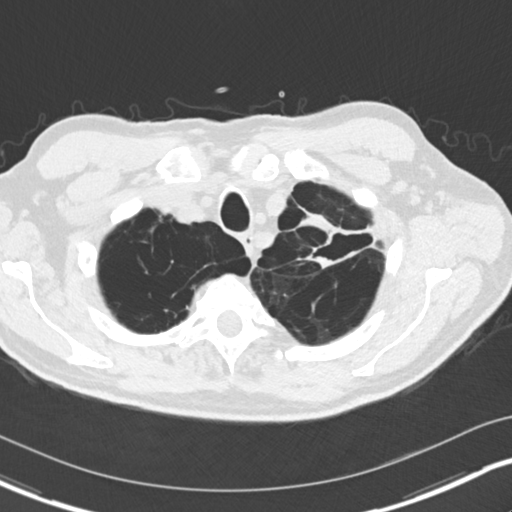
[im 146/158  lung]
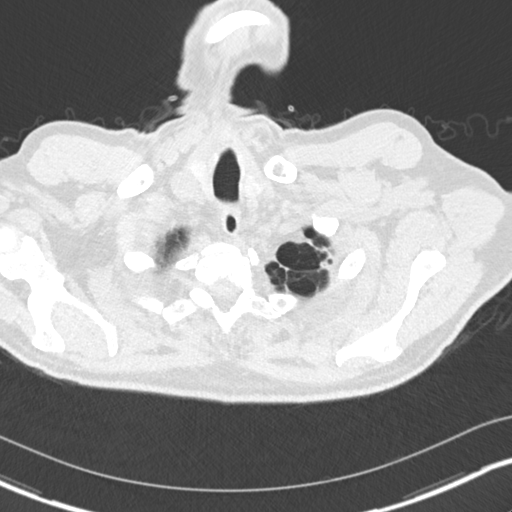

[Series 5: coronal · coronal · 0.63mm/px · 3 of 151 slices shown]
[im 31/151  lung]
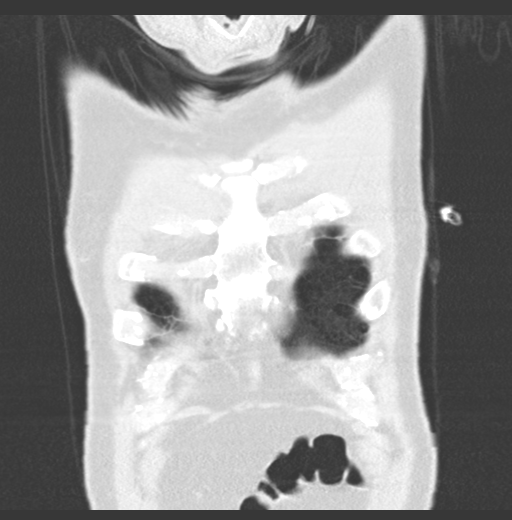
[im 61/151  lung]
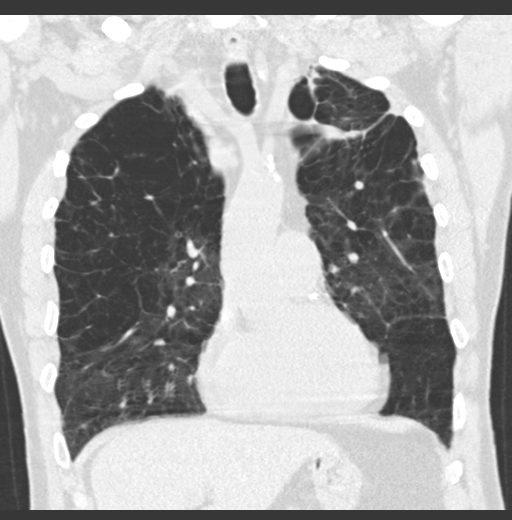
[im 91/151  lung]
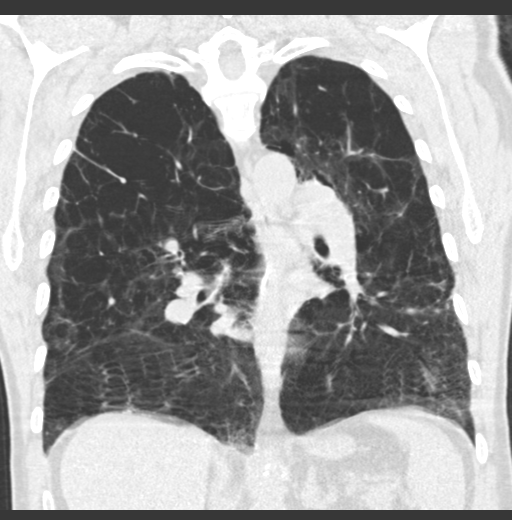

[15 of 36 positions shown; findings below may reference images not displayed]

FINDINGS: Cardiovascular: Coronary, aortic arch, and branch vessel
atherosclerotic vascular disease.

Mediastinum/Nodes: Right hilar lymph node 1.2 cm in short axis on
image 86/2, stable. Additional notable bilateral hilar nodal tissue.
These nodes are difficult to measure due to the lack of IV contrast
causing than to be of the same density is adjacent vessels.

Small type 1 hiatal hernia.

Lungs/Pleura: Markedly severe emphysema. Chronic interstitial
accentuation posteriorly in the right lower lobe, coarsely
irregular.

Cavitary left upper lobe lesion measuring 5.2 by 2.7 by 7.0 cm has a
band of internal density and wall irregularity with variable
thickness. There is increase in airspace opacity along the posterior
inferior margin of this lesion on image 130/6 compared to the prior
exam. This could be from atelectasis or pneumonia adjacent to the
lesion. Otherwise the morphology is similar to prior. This lesion
has had a slow evolution and shape back from October 2013 but also has
a lot of similarities back through October 2013.

The medial nodularity in the left upper lobe on image 41/4 appears
stable measuring 1.7 by 0.6 cm with a small amount of internal gas
density as before.

A lingular nodule measures 0.9 by 0.6 cm. Back on 10/20/2013 this
nodule measured 0.8 by 0.5 cm by my measurement.

There is significantly increased peripheral airspace opacity in the
left lower lobe with enlargement of the cavitary lesion at that
site, the total process measures 5.2 by 3.0 cm on image 97/4, and
previously measured 2.7 by 1.5 cm on 02/12/2017.

Airway thickening is present, suggesting bronchitis or reactive
airways disease.

Upper Abdomen: Unremarkable

Musculoskeletal: Mild thoracic spondylosis.
IMPRESSION: 1. Chronic cavitary left upper lobe lesion has demonstrated slow
evolution in appearance over the last 3 years. This lesion has wall
irregularity of variable thickness probably representing chronic
smoldering infection, fungal infection is not excluded. On today's
exam there is some increase in airspace opacity along the inferior
margin of the lesion.
2. There is also increase in airspace opacity in the left lower lobe
surrounding a cavitary lesion or bulla, progressive compared to
02/12/2017.
3.  Emphysema ([id number]).
4. Chronic interstitial accentuation at the lung bases, right
greater than left.
5. Other imaging findings of potential clinical significance: Aortic
Atherosclerosis ([id number]-VM1.1). Coronary atherosclerosis. Airway
thickening is present, suggesting bronchitis or reactive airways
disease. Small type 1 hiatal hernia.

## 2018-11-22 DIAGNOSIS — J449 Chronic obstructive pulmonary disease, unspecified: Secondary | ICD-10-CM | POA: Diagnosis not present

## 2018-11-24 DIAGNOSIS — M545 Low back pain: Secondary | ICD-10-CM | POA: Diagnosis not present

## 2018-11-24 DIAGNOSIS — J9611 Chronic respiratory failure with hypoxia: Secondary | ICD-10-CM | POA: Diagnosis not present

## 2018-11-24 DIAGNOSIS — J441 Chronic obstructive pulmonary disease with (acute) exacerbation: Secondary | ICD-10-CM | POA: Diagnosis not present

## 2018-11-24 DIAGNOSIS — E1165 Type 2 diabetes mellitus with hyperglycemia: Secondary | ICD-10-CM | POA: Diagnosis not present

## 2018-11-24 LAB — ACID FAST CULTURE WITH REFLEXED SENSITIVITIES (MYCOBACTERIA): Acid Fast Culture: NEGATIVE

## 2018-12-02 DIAGNOSIS — J9611 Chronic respiratory failure with hypoxia: Secondary | ICD-10-CM | POA: Diagnosis not present

## 2018-12-02 DIAGNOSIS — J441 Chronic obstructive pulmonary disease with (acute) exacerbation: Secondary | ICD-10-CM | POA: Diagnosis not present

## 2018-12-02 DIAGNOSIS — I5032 Chronic diastolic (congestive) heart failure: Secondary | ICD-10-CM | POA: Diagnosis not present

## 2018-12-02 DIAGNOSIS — A318 Other mycobacterial infections: Secondary | ICD-10-CM | POA: Diagnosis not present

## 2018-12-03 IMAGING — CR DG CHEST 1V PORT
1 series · 1 of 1 positions shown · non-contrast
Comparison: Chest CT 09/01/2017

CLINICAL DATA: Shortness of breath.

EXAM:
PORTABLE CHEST 1 VIEW

[portable]
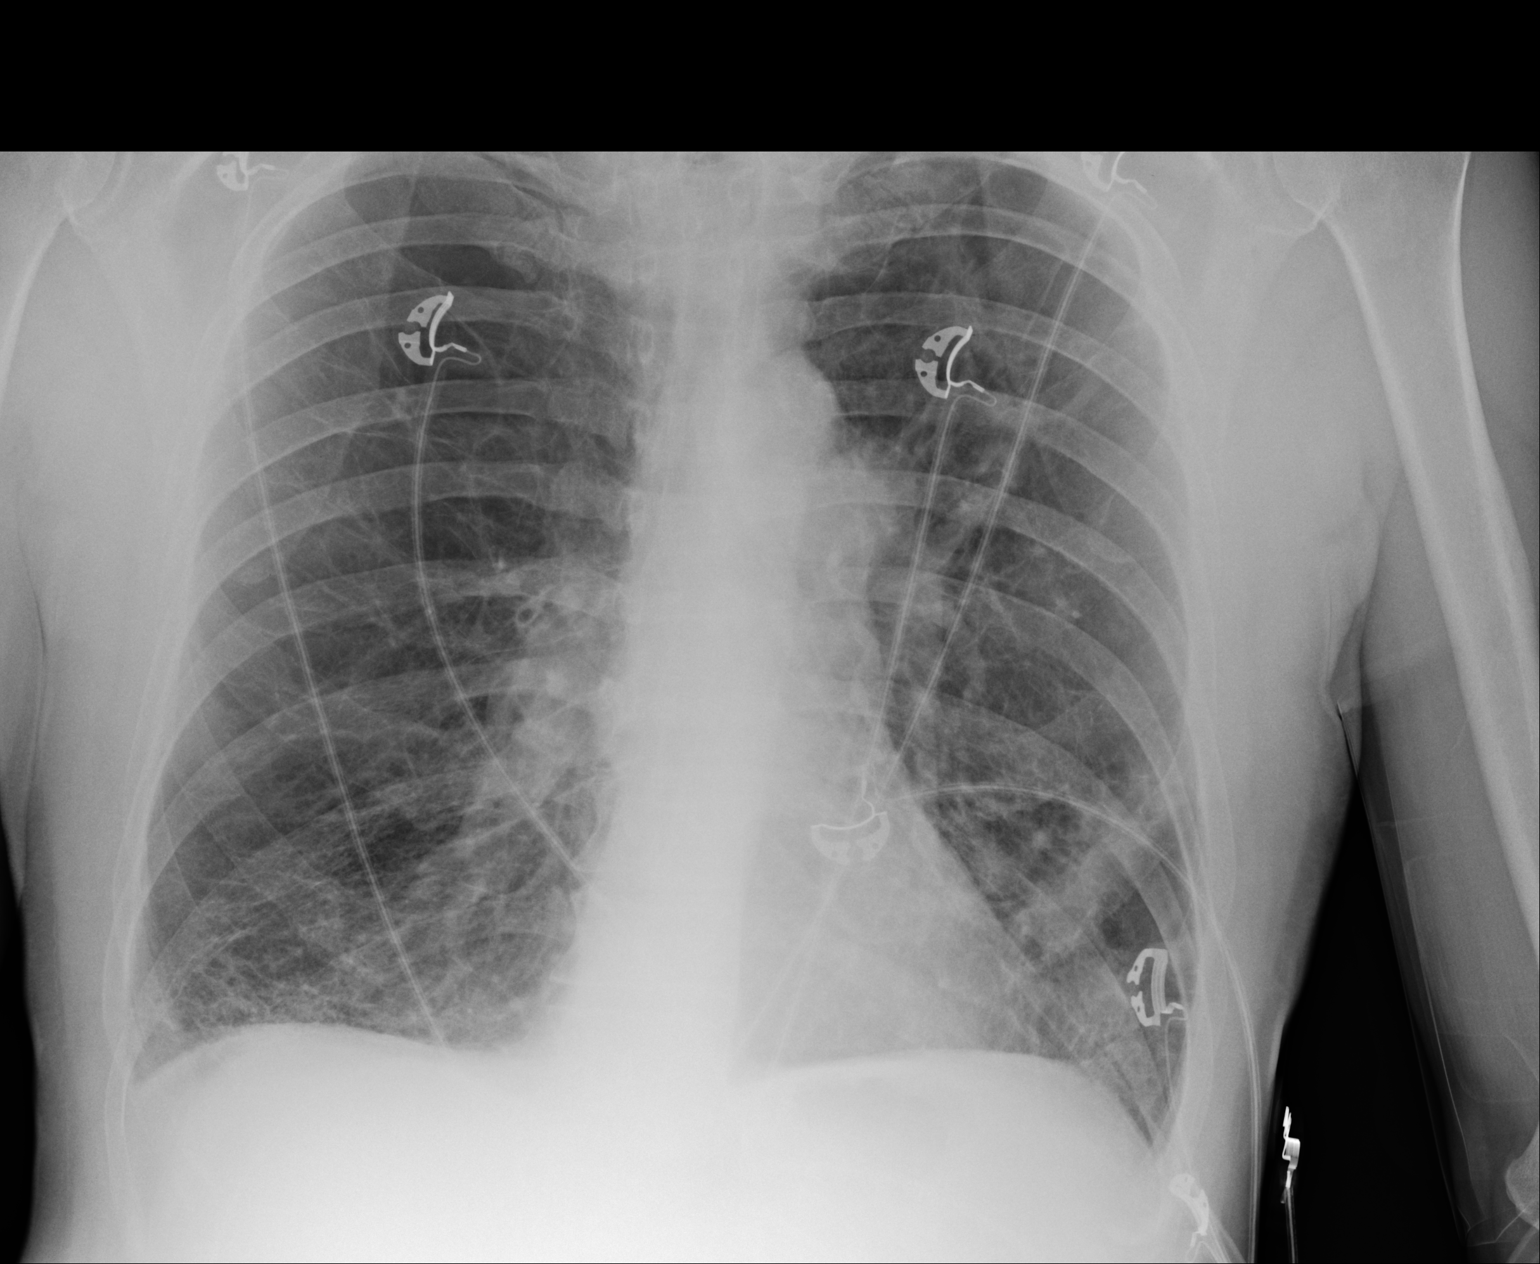

[1 of 1 positions shown; findings below may reference images not displayed]

FINDINGS: Advanced emphysema and hyperinflation. Peripheral left lower lobe
cavitary lesion with surrounding soft tissue density, better
assessed on prior MRI. The left upper lobe cavitary lesion on prior
CT is only faintly visualized radiographically. Normal heart size.
Unchanged mediastinal contours. Chronic bronchial thickening. No
pulmonary edema, pleural effusion or pneumothorax.
IMPRESSION: 1. Advanced emphysema with cavitary left lung lesions, assessed on
recent CT.
2. No evidence of acute abnormality.

## 2018-12-16 DIAGNOSIS — E1165 Type 2 diabetes mellitus with hyperglycemia: Secondary | ICD-10-CM | POA: Diagnosis not present

## 2018-12-16 DIAGNOSIS — J449 Chronic obstructive pulmonary disease, unspecified: Secondary | ICD-10-CM | POA: Diagnosis not present

## 2018-12-16 DIAGNOSIS — J9611 Chronic respiratory failure with hypoxia: Secondary | ICD-10-CM | POA: Diagnosis not present

## 2018-12-16 DIAGNOSIS — I5032 Chronic diastolic (congestive) heart failure: Secondary | ICD-10-CM | POA: Diagnosis not present

## 2018-12-23 DIAGNOSIS — J449 Chronic obstructive pulmonary disease, unspecified: Secondary | ICD-10-CM | POA: Diagnosis not present

## 2019-01-04 IMAGING — DX DG CHEST 2V
2 series · 2 of 2 positions shown · non-contrast
Comparison: 09/15/2017.  CT chest 09/01/2017.

CLINICAL DATA: Deep cough with chest pain

EXAM:
CHEST - 2 VIEW

[chest ap]
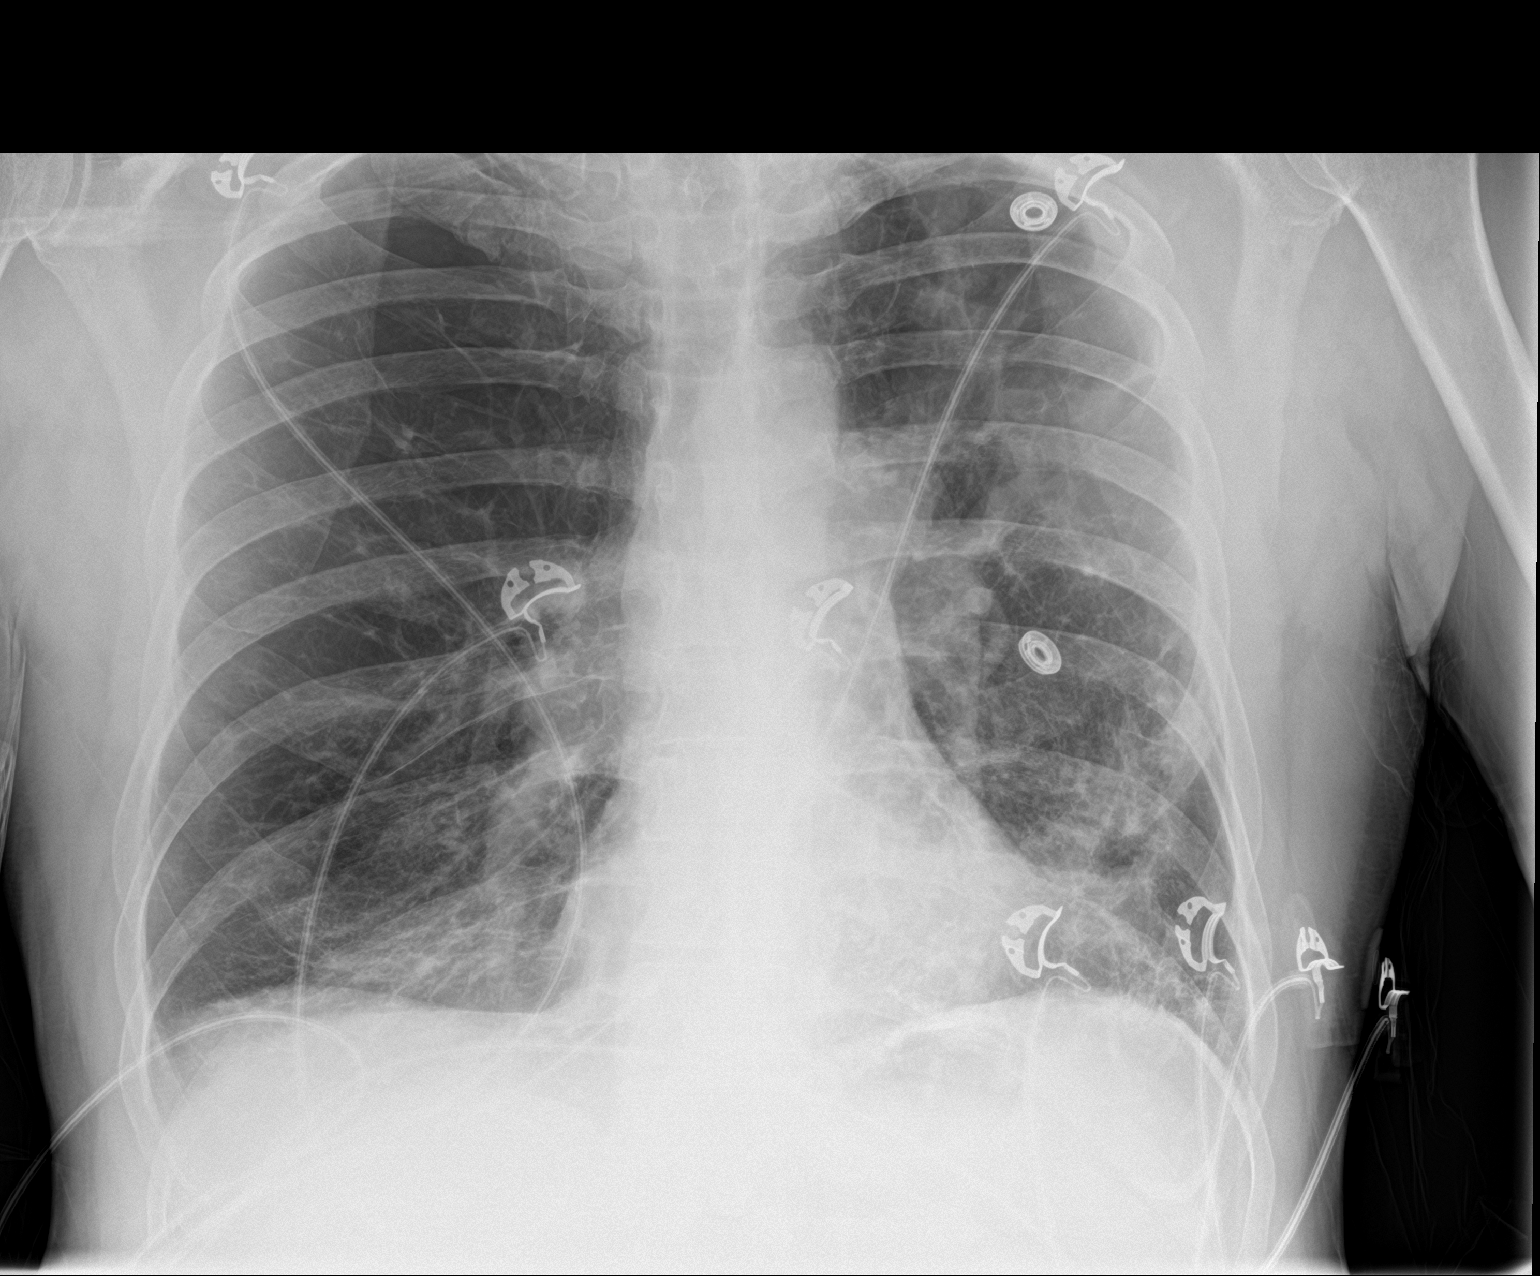

[chest lat]
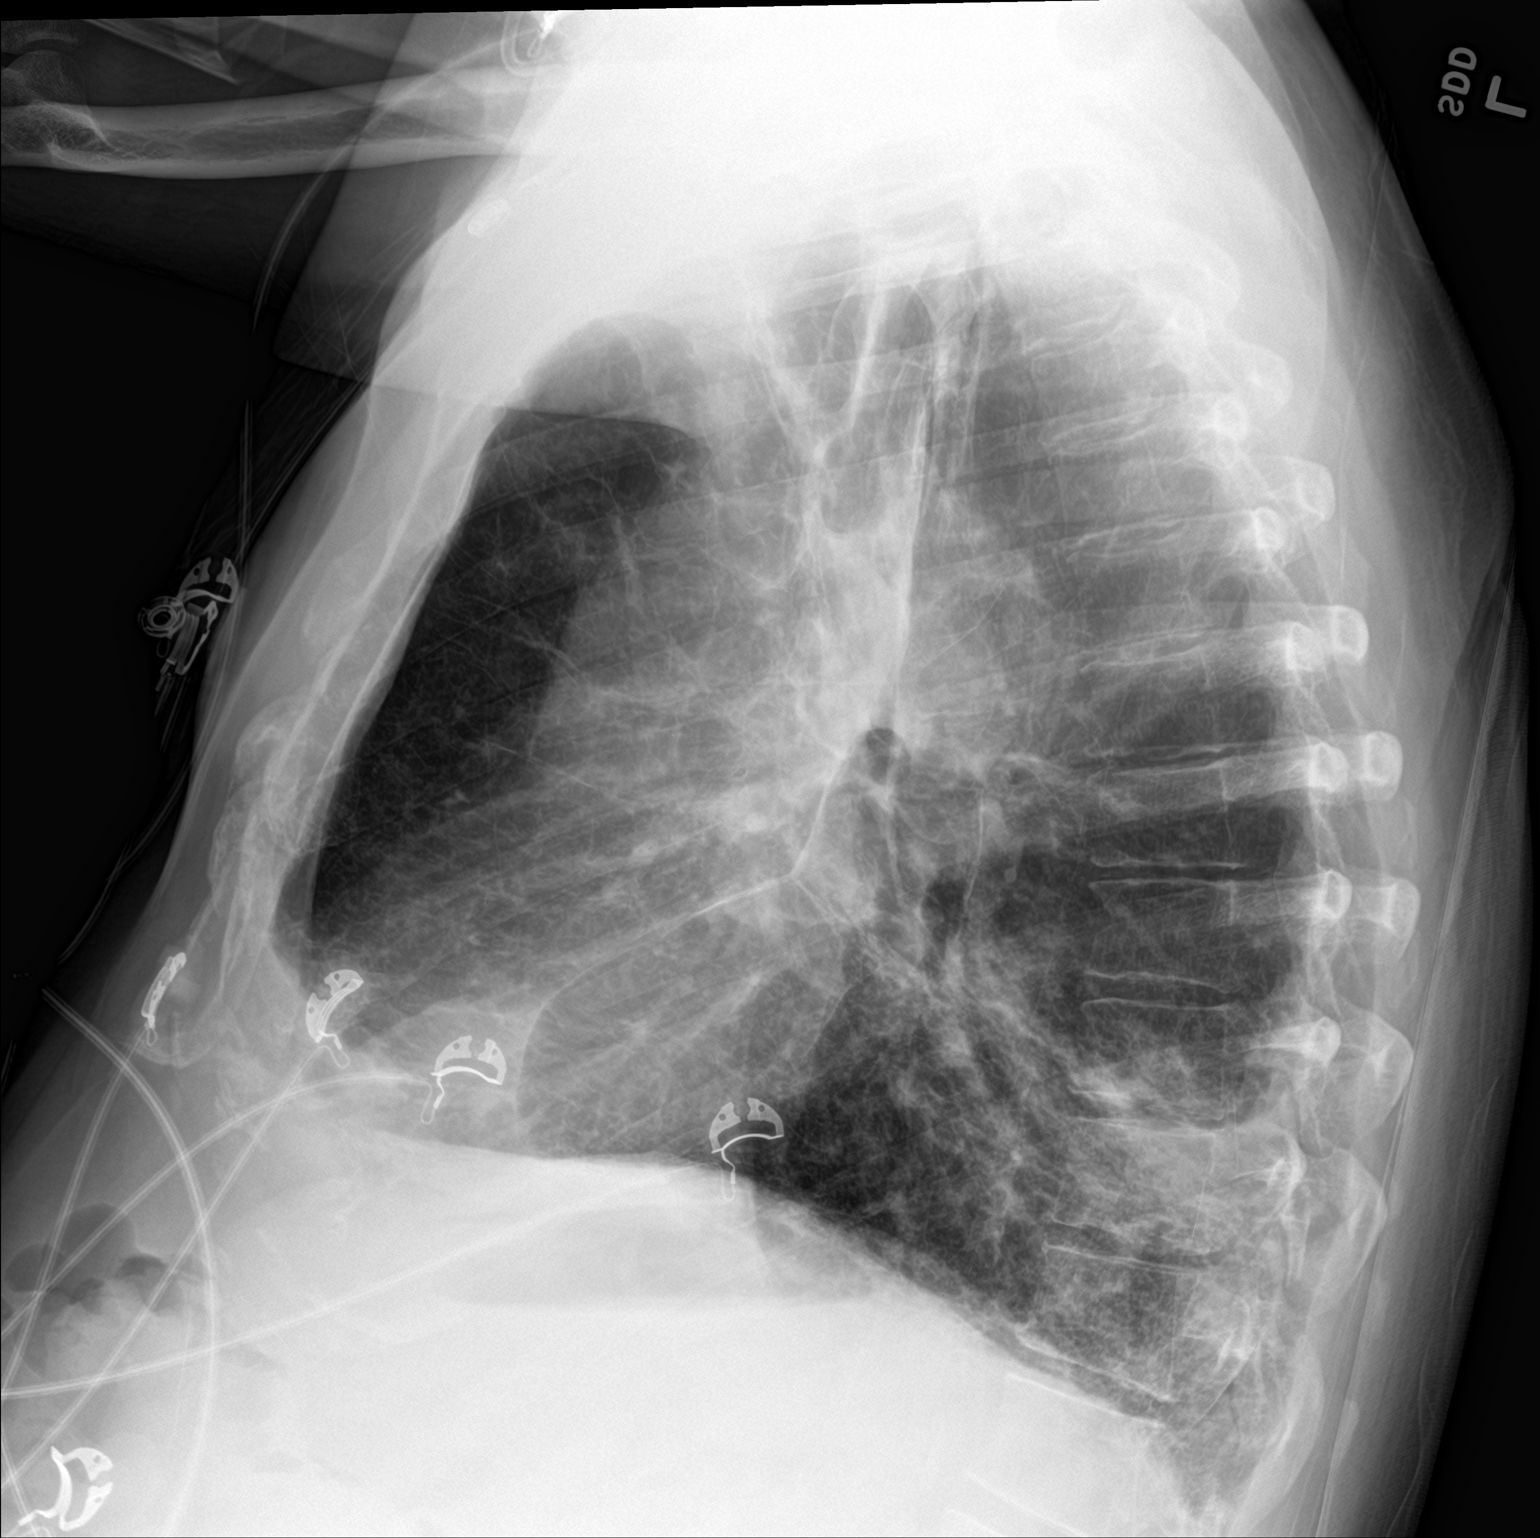

[2 of 2 positions shown; findings below may reference images not displayed]

FINDINGS: Lungs are hyperexpanded. Chronic interstitial changes noted right
lung base with bullous change in the right apex. Cavitary lesions
again noted in the left mid and posterior lower lung. The
cardiopericardial silhouette is within normal limits for size. The
visualized bony structures of the thorax are intact. Telemetry leads
overlie the chest.
IMPRESSION: Marked hyperexpansion with cavitary lesions in the left mid and
posterior lower lung, better seen on previous CT..

## 2019-01-07 ENCOUNTER — Other Ambulatory Visit: Payer: Self-pay

## 2019-01-07 ENCOUNTER — Other Ambulatory Visit (HOSPITAL_COMMUNITY): Payer: Self-pay | Admitting: Pulmonary Disease

## 2019-01-07 ENCOUNTER — Ambulatory Visit (HOSPITAL_COMMUNITY)
Admission: RE | Admit: 2019-01-07 | Discharge: 2019-01-07 | Disposition: A | Discharge status: Home / Self Care | Payer: Medicare Other | Source: Ambulatory Visit | Attending: Pulmonary Disease | Admitting: Pulmonary Disease

## 2019-01-07 ENCOUNTER — Other Ambulatory Visit: Payer: Self-pay | Admitting: Pulmonary Disease

## 2019-01-07 DIAGNOSIS — M79605 Pain in left leg: Secondary | ICD-10-CM | POA: Diagnosis not present

## 2019-01-07 DIAGNOSIS — R2242 Localized swelling, mass and lump, left lower limb: Secondary | ICD-10-CM | POA: Diagnosis not present

## 2019-01-10 ENCOUNTER — Other Ambulatory Visit: Payer: Self-pay

## 2019-01-10 ENCOUNTER — Ambulatory Visit (INDEPENDENT_AMBULATORY_CARE_PROVIDER_SITE_OTHER): Payer: Medicare Other | Admitting: Internal Medicine

## 2019-01-10 ENCOUNTER — Encounter: Payer: Self-pay | Admitting: Internal Medicine

## 2019-01-10 VITALS — BP 147/67 | HR 125 | Temp 97.7°F | Wt 188.0 lb

## 2019-01-10 DIAGNOSIS — J984 Other disorders of lung: Secondary | ICD-10-CM

## 2019-01-10 DIAGNOSIS — A310 Pulmonary mycobacterial infection: Secondary | ICD-10-CM

## 2019-01-10 DIAGNOSIS — H539 Unspecified visual disturbance: Secondary | ICD-10-CM | POA: Diagnosis not present

## 2019-01-10 NOTE — Assessment & Plan Note (Signed)
Tolerating 2 drug therapy and repeat sputum is negative.  Regardless, I will see if we can get clofazimine or inhaled amikacin as additional therapy.

## 2019-01-10 NOTE — Assessment & Plan Note (Signed)
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   Recent CT done in March noted no changes in cavitary lesions.

## 2019-01-10 NOTE — Progress Notes (Signed)
   Subjective:    Patient ID: Ryan [name], male    DOB: January 04, 1961, 58 y.o.   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  I saw him in May and then June 2019 by referral from Dr. Luan Pulling for treatment recommendations with his history of potential eye issues with cipro.  I started him on rifampin, ethambutol and azithromycin.  He started to have some eye issues again and went to his ophthalmologist who felt it was related to the ethambutol, so this was stopped.  He has continued on two drug daily therapy for his cavitary lesion. He did get a sputum after his last visit and it has remained negative for MAI, now over 6 weeks.  Overall he feels like he has continued to have some clinical improvement.  No night sweats, continued sputum but not worse.  He did see a pulmonologist at [facility] who recommended stopping the MAI treatment and to start pulmonary rehab.  He currently is having to use 6 liter O2 by nasal cannula continuously.  He has now been on treatment for just over 1 year.  He states he feels much better with doxycycline, feels it helps him 'bring things up'.  No weight loss.     Review of Systems  Constitutional: Negative for fever.  Gastrointestinal: Negative for diarrhea and nausea.  Skin: Negative for rash.       Objective:   Physical Exam Constitutional:      Appearance: Normal appearance.  Eyes:     General: No scleral icterus. Cardiovascular:     Rate and Rhythm: Normal rate and regular rhythm.  Pulmonary:     Effort: Pulmonary effort is normal.     Breath sounds: No wheezing.     Comments: Distant breath sounds.  Skin:    Findings: No rash.  Neurological:     Mental Status: He is alert.   SH: no tobacco for 5 years        Assessment & Plan:

## 2019-01-10 NOTE — Assessment & Plan Note (Signed)
Resolved and no issues.  Will avoid ethambutol

## 2019-01-11 LAB — COMPLETE METABOLIC PANEL WITH GFR
AG Ratio: 1.3 (calc) (ref 1.0–2.5)
ALT: 71 U/L — ABNORMAL HIGH (ref 9–46)
AST: 35 U/L (ref 10–35)
Albumin: 3.7 g/dL (ref 3.6–5.1)
Alkaline phosphatase (APISO): 49 U/L (ref 35–144)
BUN: 17 mg/dL (ref 7–25)
CO2: 29 mmol/L (ref 20–32)
Calcium: 8.7 mg/dL (ref 8.6–10.3)
Chloride: 94 mmol/L — ABNORMAL LOW (ref 98–110)
Creat: 1.2 mg/dL (ref 0.70–1.25)
GFR, Est African American: 76 mL/min/{1.73_m2} (ref >=60)
GFR, Est Non African American: 65 mL/min/{1.73_m2} (ref >=60)
Globulin: 2.8 g/dL (calc) (ref 1.9–3.7)
Glucose, Bld: 398 mg/dL — ABNORMAL HIGH (ref 65–99)
Potassium: 4.6 mmol/L (ref 3.5–5.3)
Sodium: 135 mmol/L (ref 135–146)
Total Bilirubin: 0.3 mg/dL (ref 0.2–1.2)
Total Protein: 6.5 g/dL (ref 6.1–8.1)

## 2019-01-12 ENCOUNTER — Telehealth: Payer: Self-pay | Admitting: Pharmacist

## 2019-01-12 DIAGNOSIS — A310 Pulmonary mycobacterial infection: Secondary | ICD-10-CM

## 2019-01-12 NOTE — Telephone Encounter (Signed)
Completed and sent clofazimine application for patient to [employer]. Will update Dr. Linus Salmons when approval status has been decided.

## 2019-01-13 MED ORDER — AMBULATORY NON FORMULARY MEDICATION: 100.0000 mg | Freq: Every day | 11 refills | Status: DC

## 2019-01-13 NOTE — Telephone Encounter (Addendum)
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  Drug supply/shipment form was send today and medication should arrive to clinic sometime next week or the week after.  Will call patient and schedule him to meet with me for pick up and counseling once medication has arrived.  Will update Dr. Linus Salmons.

## 2019-01-13 NOTE — Addendum Note (Signed)
Addended by: Darletta Moll on: 01/13/2019 01:57 PM   Modules accepted: Orders

## 2019-01-19 DIAGNOSIS — J9611 Chronic respiratory failure with hypoxia: Secondary | ICD-10-CM | POA: Diagnosis not present

## 2019-01-19 DIAGNOSIS — I5032 Chronic diastolic (congestive) heart failure: Secondary | ICD-10-CM | POA: Diagnosis not present

## 2019-01-19 DIAGNOSIS — E1165 Type 2 diabetes mellitus with hyperglycemia: Secondary | ICD-10-CM | POA: Diagnosis not present

## 2019-01-19 DIAGNOSIS — J441 Chronic obstructive pulmonary disease with (acute) exacerbation: Secondary | ICD-10-CM | POA: Diagnosis not present

## 2019-01-22 DIAGNOSIS — J449 Chronic obstructive pulmonary disease, unspecified: Secondary | ICD-10-CM | POA: Diagnosis not present

## 2019-01-24 NOTE — Telephone Encounter (Signed)
Great.  Let's go ahead and start it if he hasn't already.   thanks

## 2019-01-24 NOTE — Telephone Encounter (Signed)
Sounds good. It has arrived. I will call him to come in and see me for pick up and counseling.

## 2019-01-25 MED ORDER — AMBULATORY NON FORMULARY MEDICATION: 100.0000 mg | Freq: Every day | 11 refills | Status: DC

## 2019-01-25 NOTE — Addendum Note (Signed)
Addended by: Magda Kiel L on: 01/25/2019 11:07 AM   Modules accepted: Orders

## 2019-01-25 NOTE — Addendum Note (Signed)
Addended by: Magda Kiel L on: 01/25/2019 10:26 AM   Modules accepted: Orders

## 2019-01-25 NOTE — Telephone Encounter (Signed)
Patient called back and asked if his wife could pick up the medication for him.  I told him that she could and she will come tomorrow morning around 10am.  I counseled him over the phone on how to take clofazimine (2 capsules once daily with breakfast) and any side effects that can occur.  He will call if he has any questions or concerns once he receives the medication and starts taking it.

## 2019-01-25 NOTE — Telephone Encounter (Signed)
Called patient to let him know that he has been approved for clofazimine and to schedule him an appointment for pick up and counseling.  Patient states that he is under the weather and has been "for months". He said it "almost killed" him to come see Dr. Linus Salmons in June.  I explained the importance of getting him started on this and that Dr. Linus Salmons wanted him to take it with his other medications but he is refusing to come to office for pick up or to even leave the house.  I told him to call when he can com in and he stated he will.

## 2019-01-31 IMAGING — US US EXTREM LOW VENOUS BILAT
1 series · 13 of 24 positions shown · non-contrast
Comparison: None.

CLINICAL DATA: 56-year-old male with bilateral lower extremity
edema for the past 3 days



[Series 1: us extrem low venous bilat · 0.08mm/px · 13 of 69 slices shown]
[im 1/69]
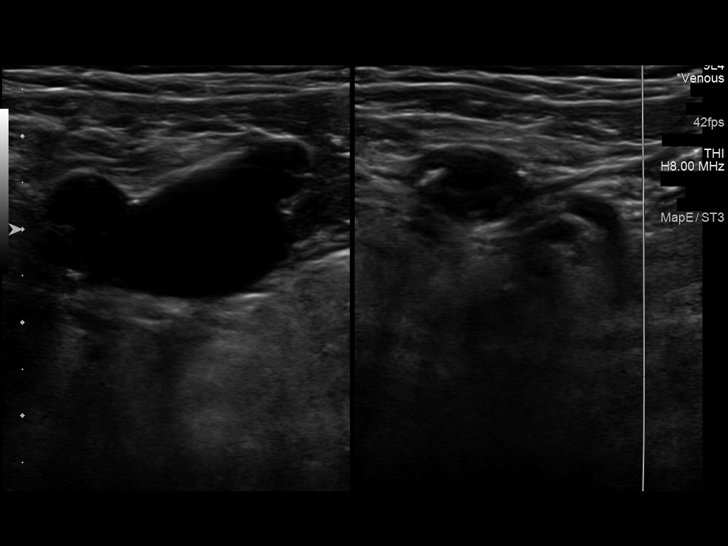
[im 6/69]
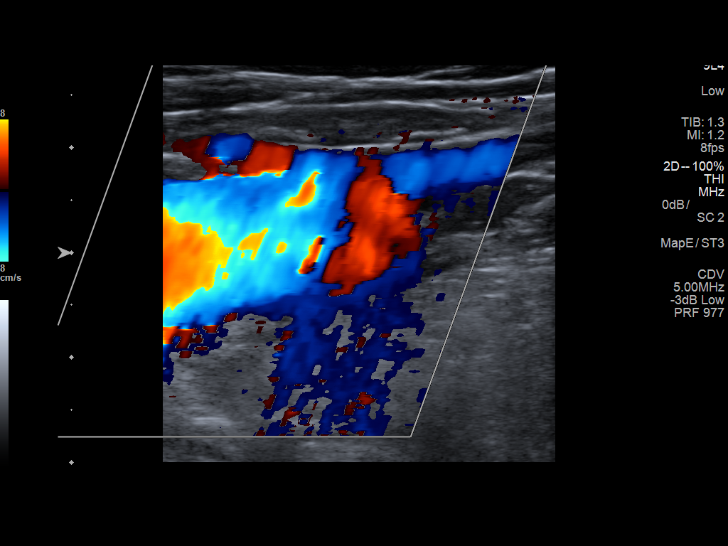
[im 12/69]
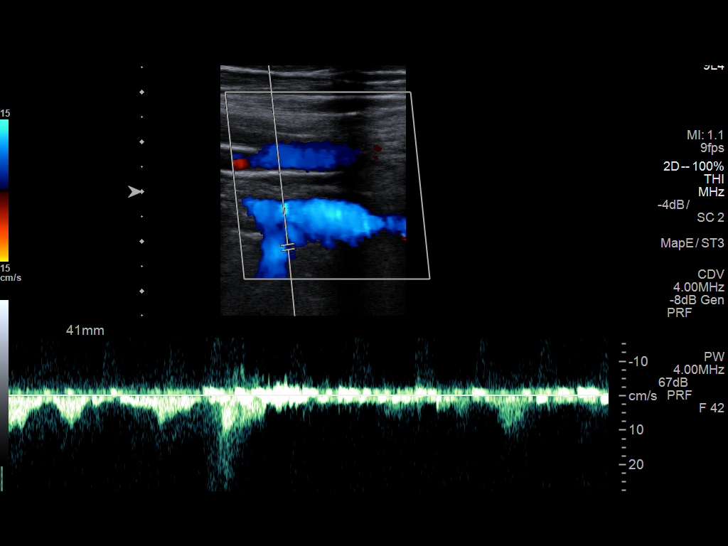
[im 18/69]
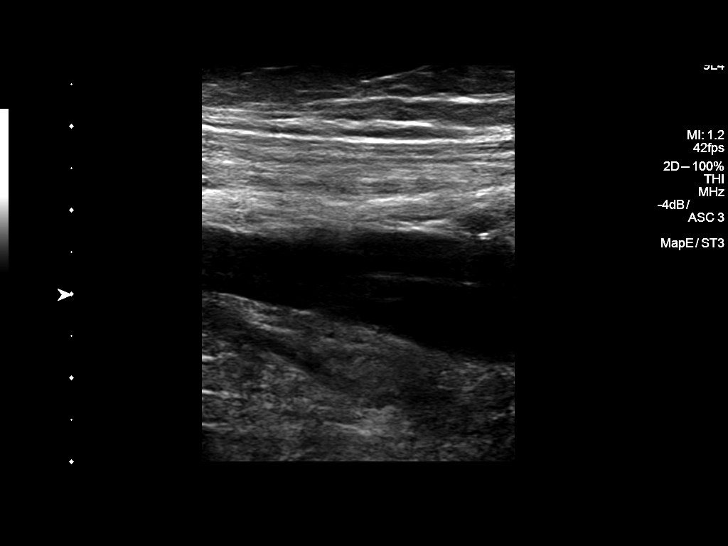
[im 24/69]
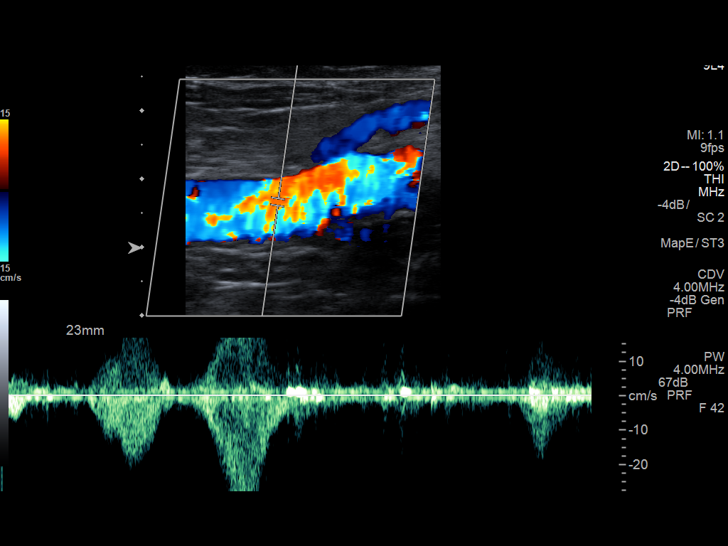
[im 30/69]
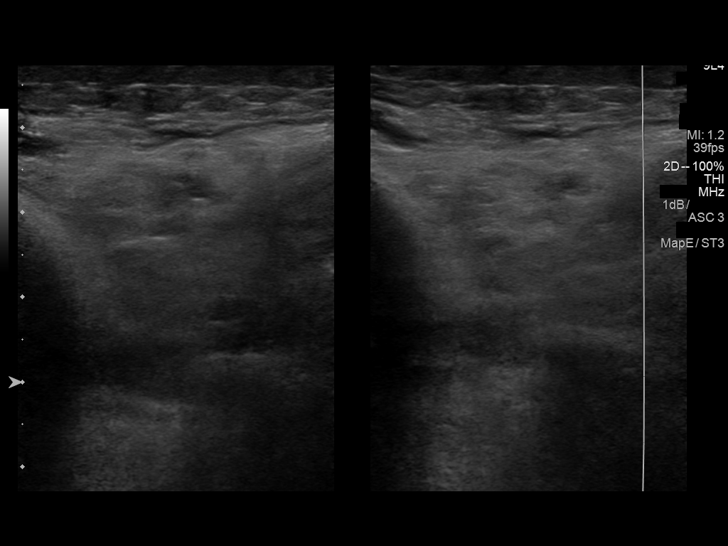
[im 36/69]
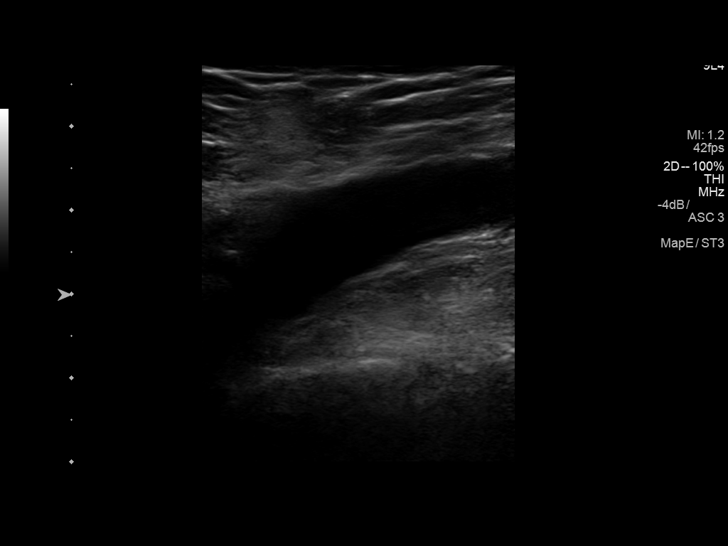
[im 39/69]
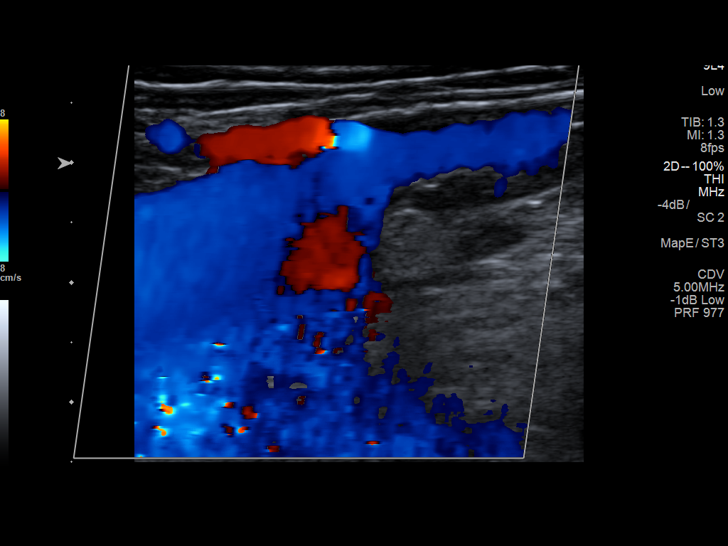
[im 45/69]
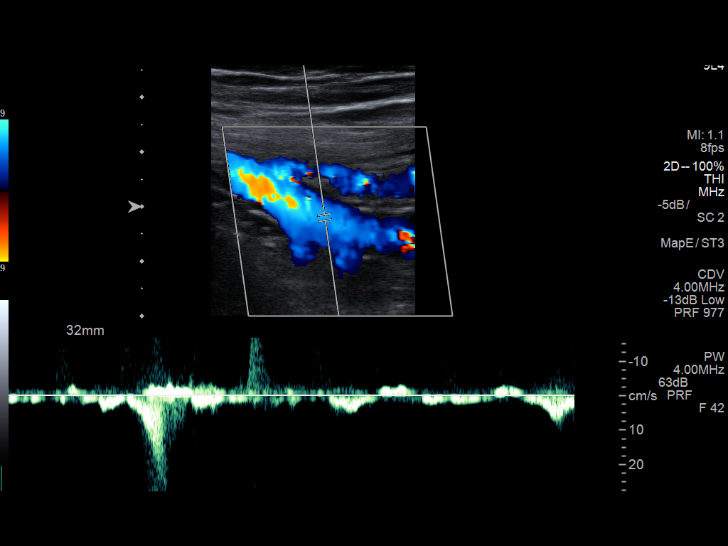
[im 51/69]
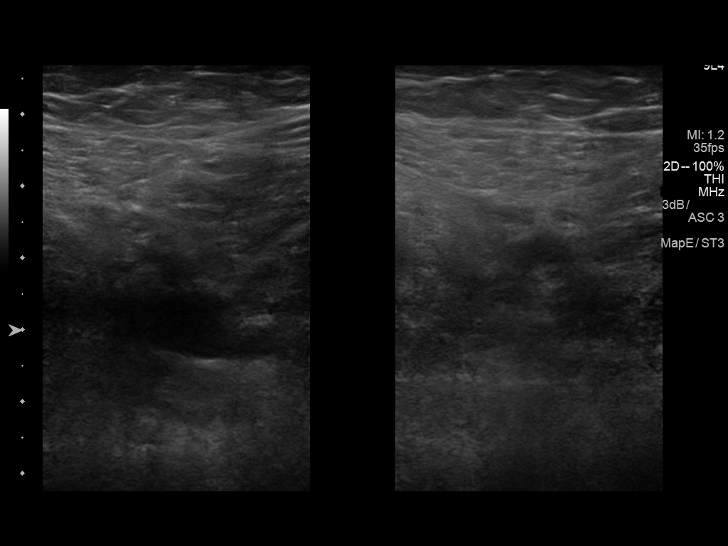
[im 57/69]
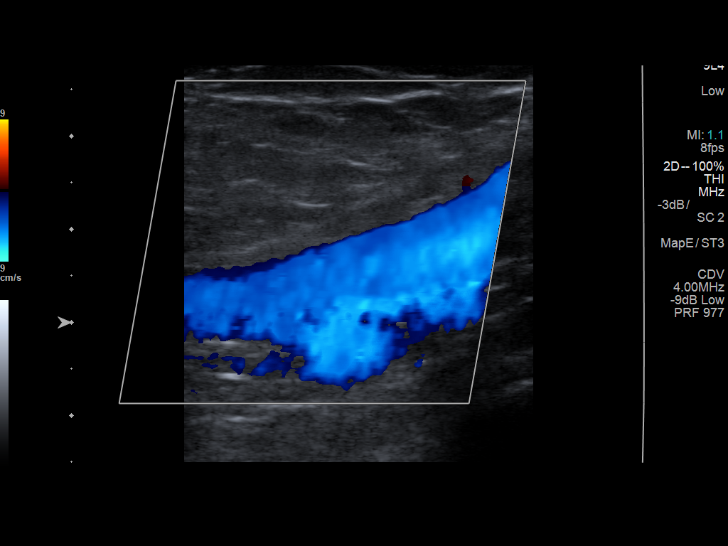
[im 63/69]
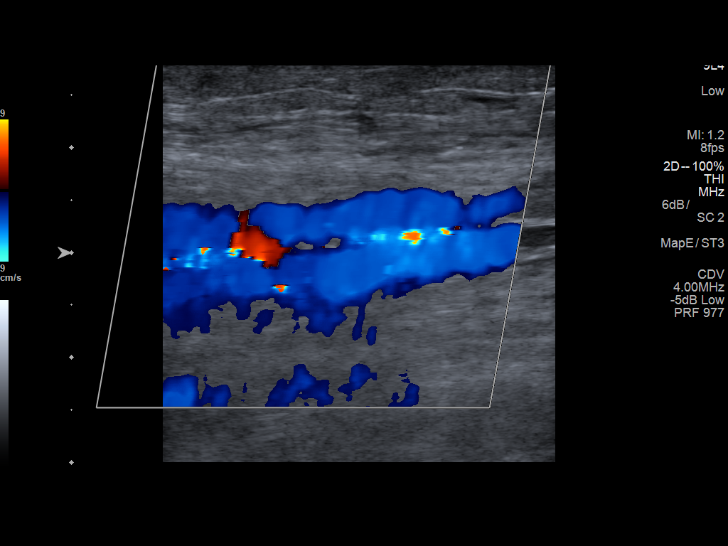
[im 69/69]
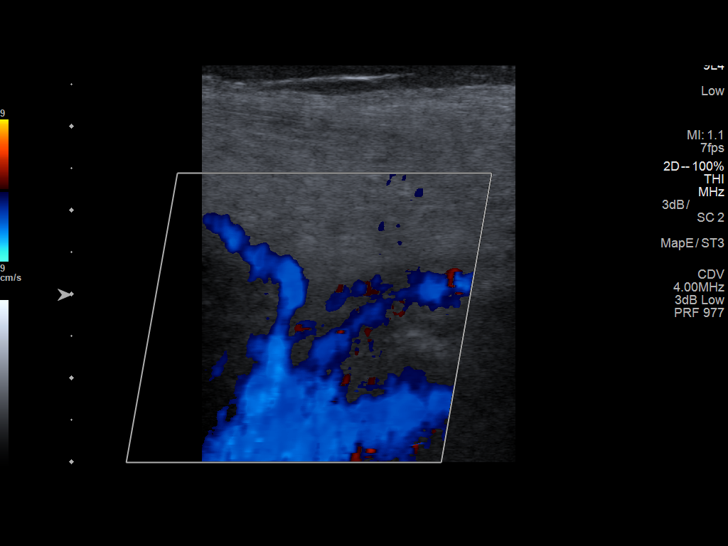

[13 of 24 positions shown; findings below may reference images not displayed]

FINDINGS: RIGHT LOWER EXTREMITY

Common Femoral Vein: No evidence of thrombus. Normal
compressibility, respiratory phasicity and response to augmentation.

Saphenofemoral Junction: No evidence of thrombus. Normal
compressibility and flow on color Doppler imaging.

Profunda Femoral Vein: No evidence of thrombus. Normal
compressibility and flow on color Doppler imaging.

Femoral Vein: No evidence of thrombus. Normal compressibility,
respiratory phasicity and response to augmentation.

Popliteal Vein: No evidence of thrombus. Normal compressibility,
respiratory phasicity and response to augmentation.

Calf Veins: No evidence of thrombus. Normal compressibility and flow
on color Doppler imaging.

Superficial Great Saphenous Vein: No evidence of thrombus. Normal
compressibility.

Venous Reflux:  None.

Other Findings:  None.

LEFT LOWER EXTREMITY

Common Femoral Vein: No evidence of thrombus. Normal
compressibility, respiratory phasicity and response to augmentation.

Saphenofemoral Junction: No evidence of thrombus. Normal
compressibility and flow on color Doppler imaging.

Profunda Femoral Vein: No evidence of thrombus. Normal
compressibility and flow on color Doppler imaging.

Femoral Vein: No evidence of thrombus. Normal compressibility,
respiratory phasicity and response to augmentation.

Popliteal Vein: No evidence of thrombus. Normal compressibility,
respiratory phasicity and response to augmentation.

Calf Veins: No evidence of thrombus. Normal compressibility and flow
on color Doppler imaging.

Superficial Great Saphenous Vein: No evidence of thrombus. Normal
compressibility.

Venous Reflux:  None.

Other Findings:  None.
IMPRESSION: No evidence of deep venous thrombosis.

## 2019-01-31 NOTE — Telephone Encounter (Signed)
Patient's wife came and picked up his clofazimine on Thursday 7/16.

## 2019-02-02 ENCOUNTER — Ambulatory Visit: Payer: Medicare Other | Admitting: Nurse Practitioner

## 2019-02-02 ENCOUNTER — Ambulatory Visit: Payer: Medicare Other | Admitting: Gastroenterology

## 2019-02-14 IMAGING — CR DG CHEST 1V PORT
1 series · 1 of 1 positions shown · non-contrast
Comparison: Radiographs October 17, 2017.

CLINICAL DATA: Cough, shortness of breath, fever.

EXAM:
PORTABLE CHEST 1 VIEW

[portable]
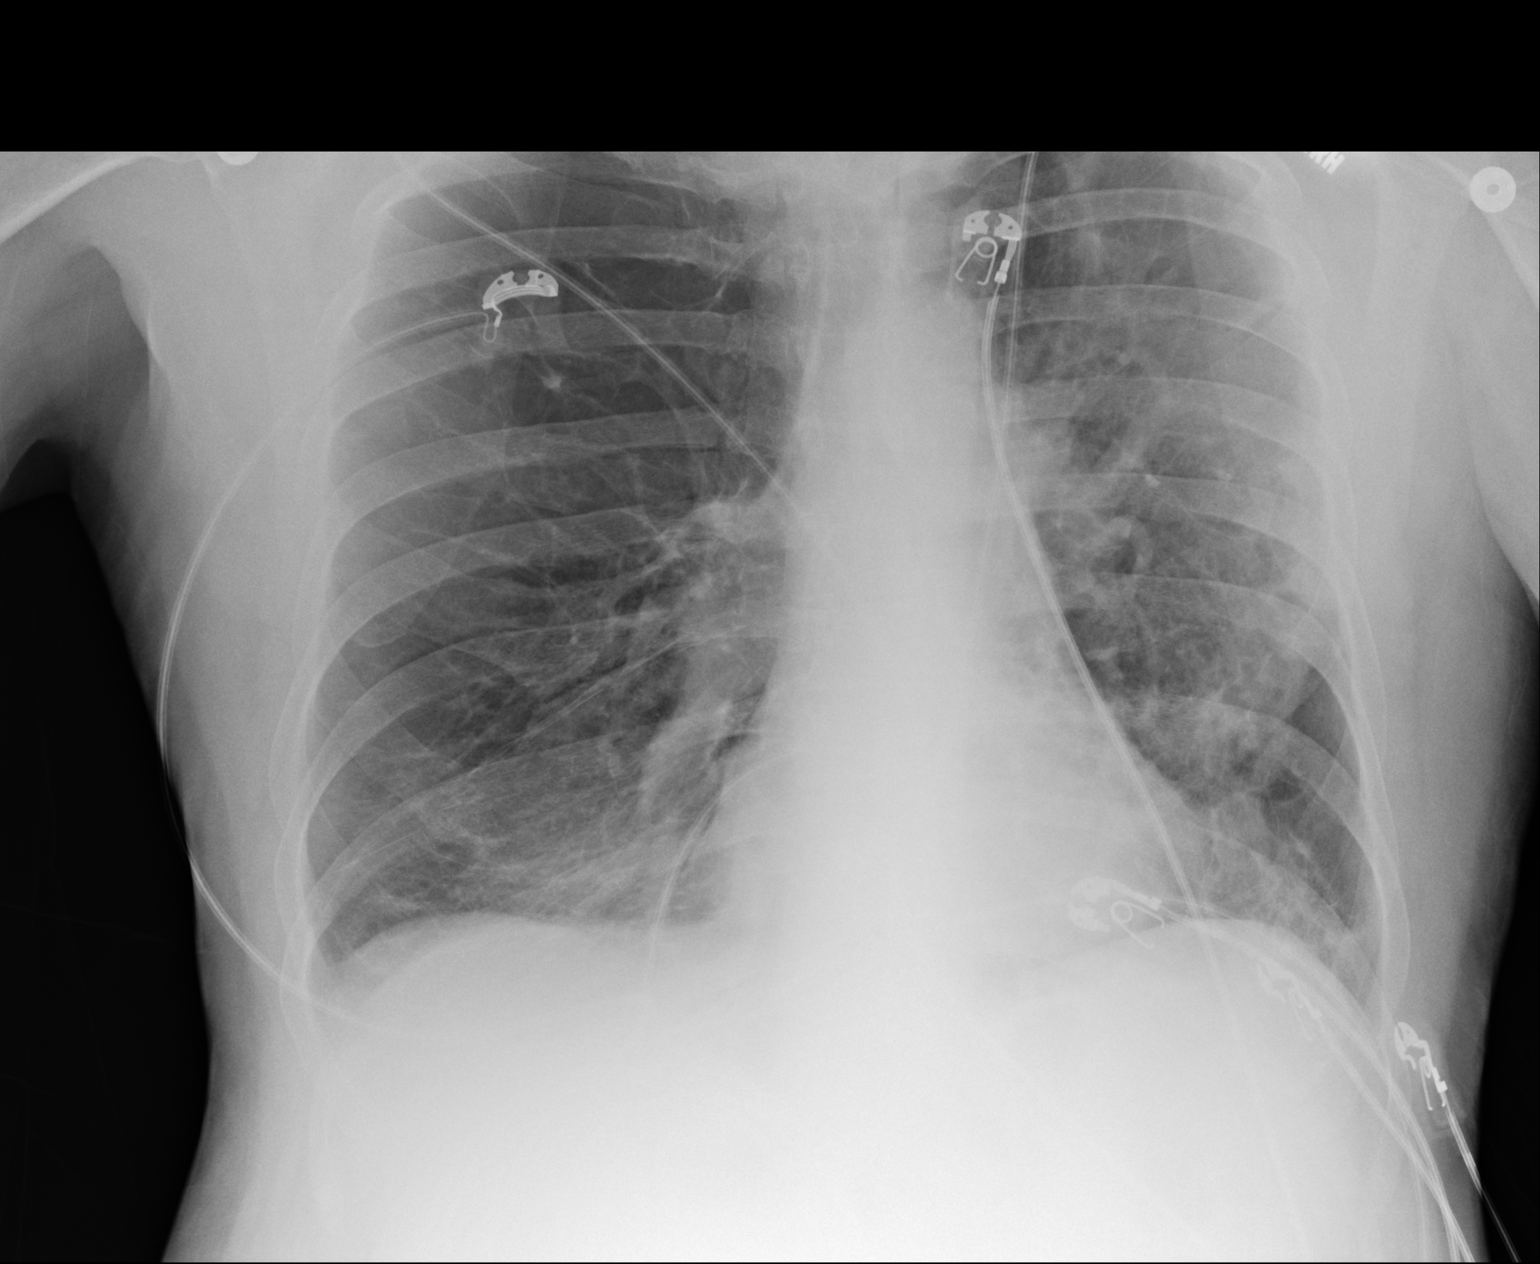

[1 of 1 positions shown; findings below may reference images not displayed]

FINDINGS: The heart size and mediastinal contours are within normal limits.
Stable emphysematous disease is noted in both upper lobes.. Stable
bibasilar opacities are noted concerning for atelectasis or
scarring. Cavitary lesions noted on prior exam are not well
visualized currently. No pneumothorax or significant pleural
effusion is noted. The visualized skeletal structures are
unremarkable.
IMPRESSION: Stable bibasilar scarring or atelectasis is noted. Stable severe
emphysematous disease is noted in the upper lobes. Cavitary lesions
in left lung are not well visualized on this study, and may be
better evaluated with CT scan.

Emphysema ([id number]).

## 2019-02-22 DIAGNOSIS — J449 Chronic obstructive pulmonary disease, unspecified: Secondary | ICD-10-CM | POA: Diagnosis not present

## 2019-02-26 IMAGING — CT CT ABD-PELV W/ CM
2 of 5 series · 17 of 46 positions shown, 19 images · IV contrast (iopamidol)
Comparison: 05/01/2015

CLINICAL DATA: Right-sided abdominal pain.

EXAM:
CT ABDOMEN AND PELVIS WITH CONTRAST
TECHNIQUE: Multidetector CT imaging of the abdomen and pelvis was performed
using the standard protocol following bolus administration of
intravenous contrast.
CONTRAST:  100mL BWE1R7-5RR IOPAMIDOL ([id number]) INJECTION 61%

[Series 2: axial st · axial · 0.68mm/px · z∈[+652,+1036]mm · 14 of 87 slices shown, 16 images]
[im 5/87  soft-tissue]
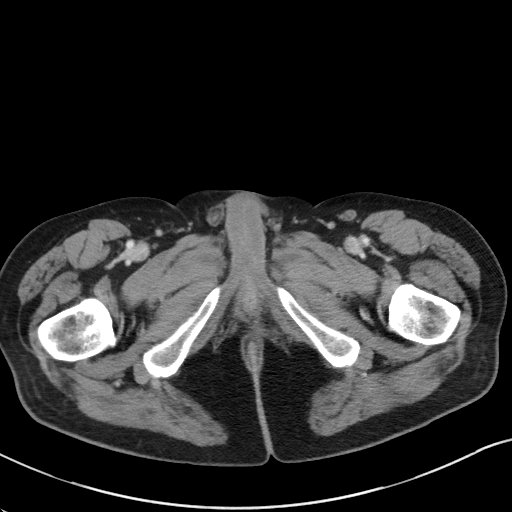
[im 5/87  bone]
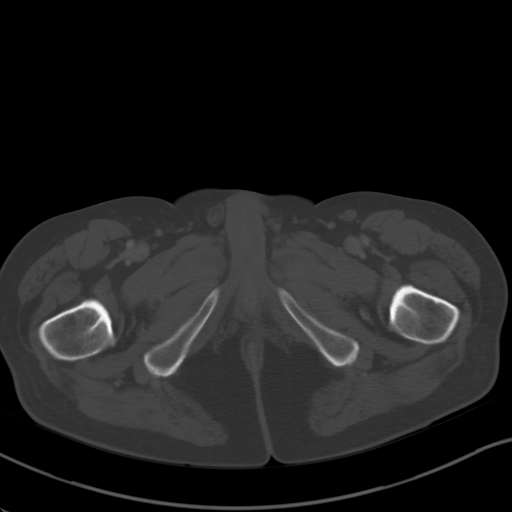
[im 10/87  soft-tissue]
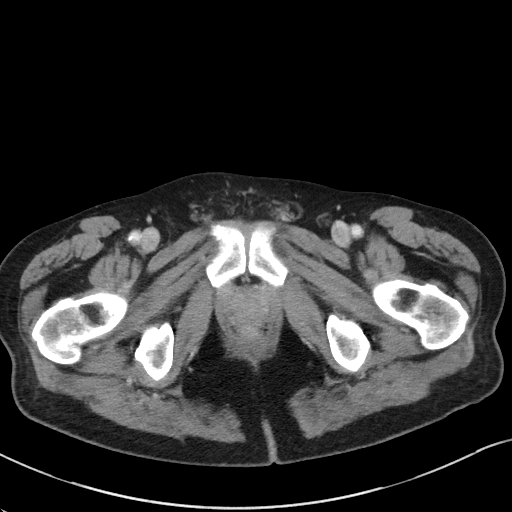
[im 20/87  soft-tissue]
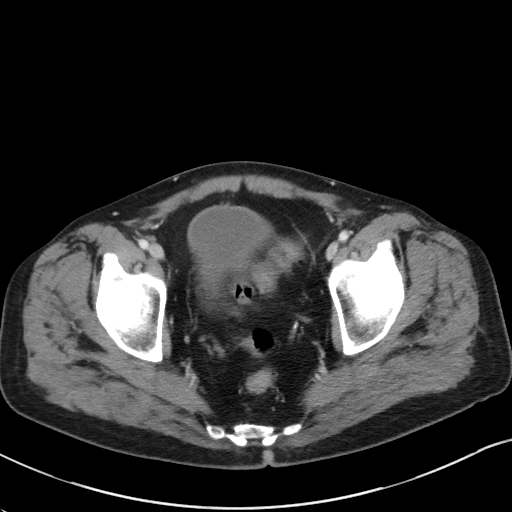
[im 24/87  soft-tissue]
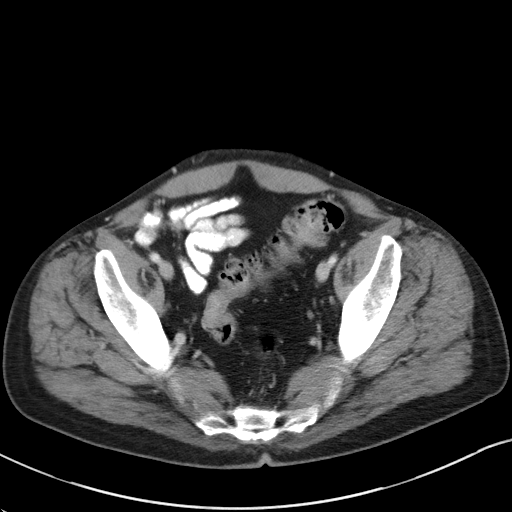
[im 29/87  soft-tissue]
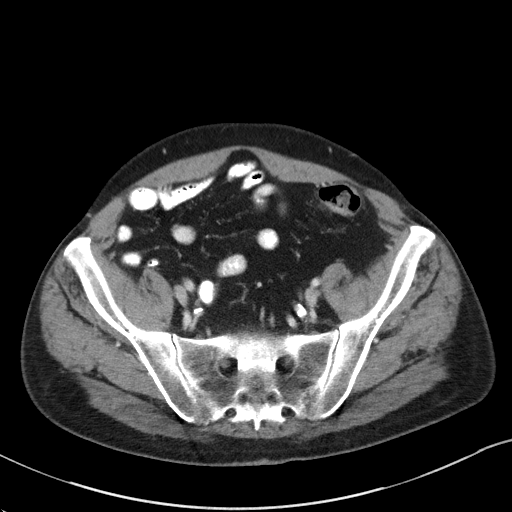
[im 34/87  soft-tissue]
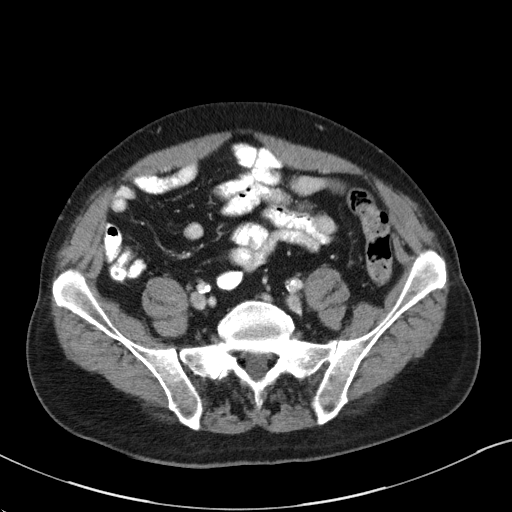
[im 39/87  soft-tissue]
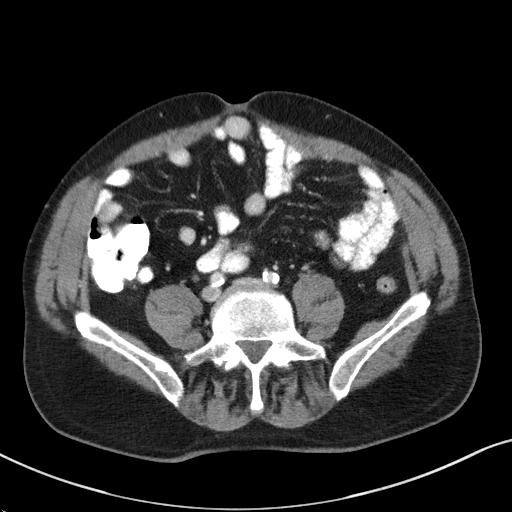
[im 48/87  soft-tissue]
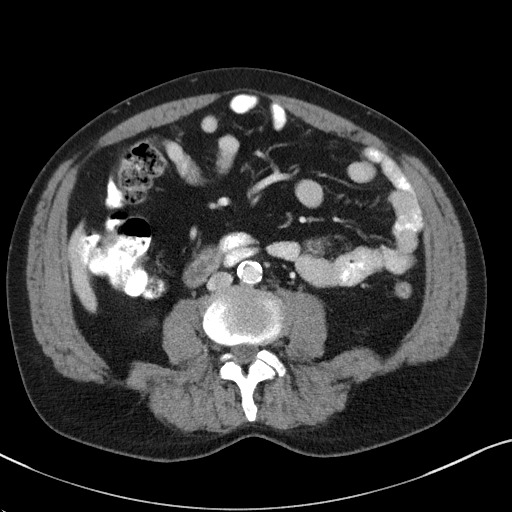
[im 53/87  soft-tissue]
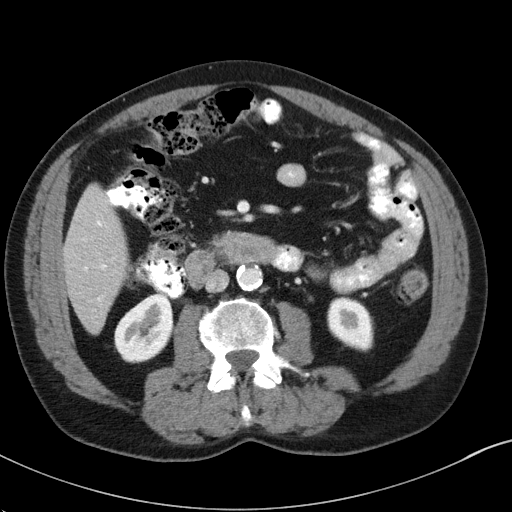
[im 53/87  bone]
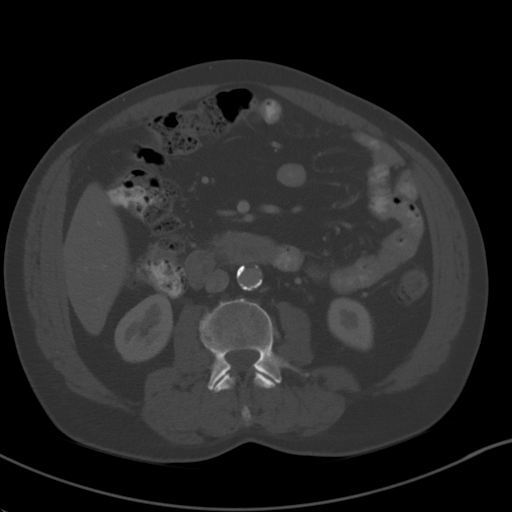
[im 58/87  soft-tissue]
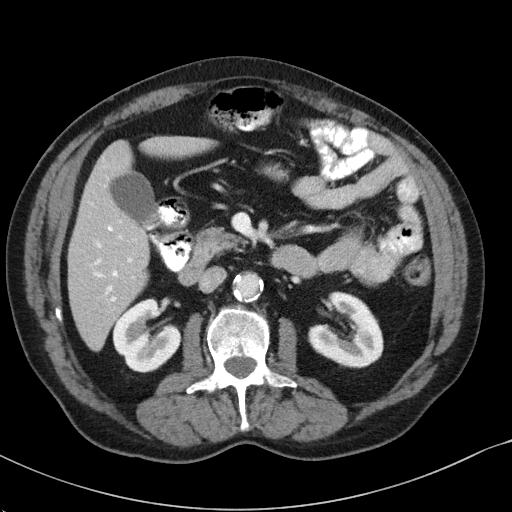
[im 63/87  soft-tissue]
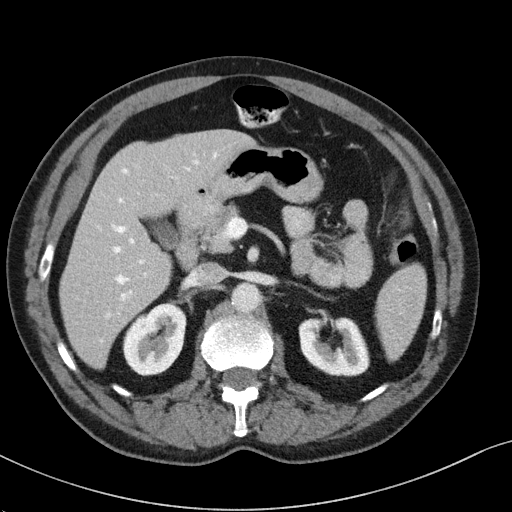
[im 67/87  soft-tissue]
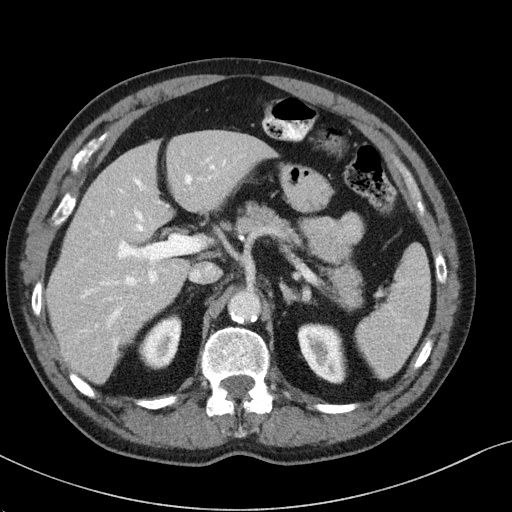
[im 77/87  soft-tissue]
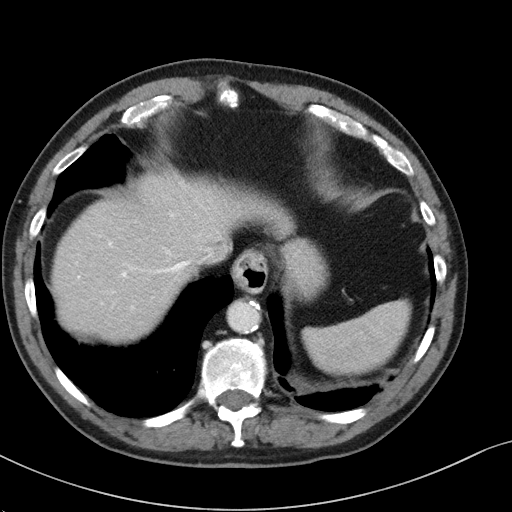
[im 82/87  soft-tissue]
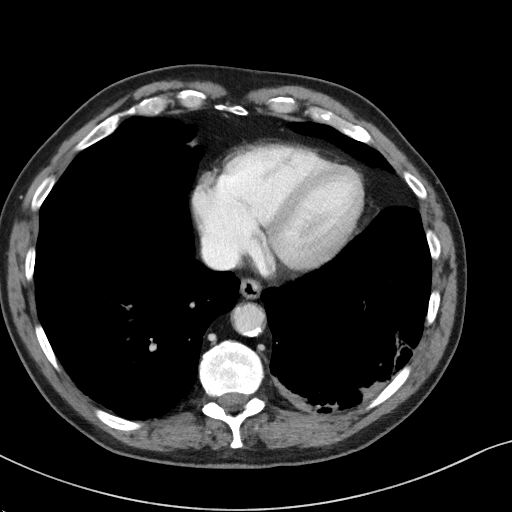

[Series 6: coronal st · coronal · 0.76mm/px · 3 of 102 slices shown]
[im 34/102  soft-tissue]
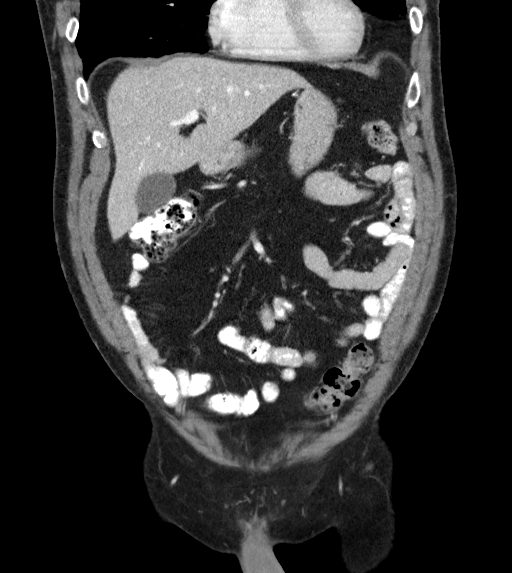
[im 45/102  soft-tissue]
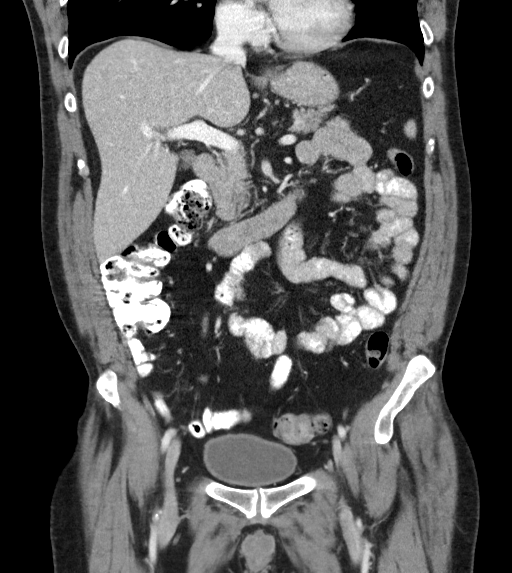
[im 57/102  soft-tissue]
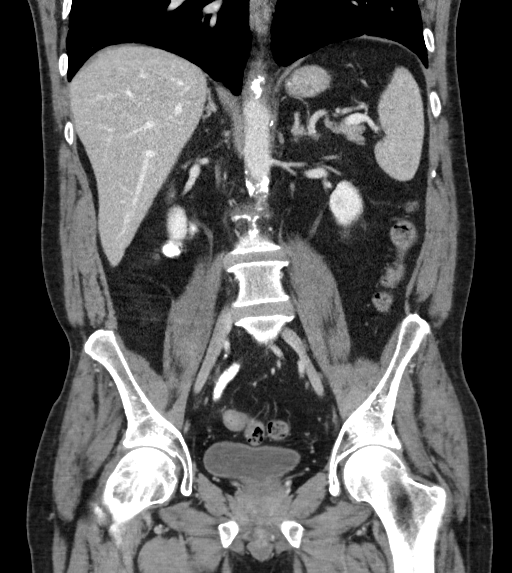

[17 of 46 positions shown; findings below may reference images not displayed]

FINDINGS: Lower chest: Left lower lobe airspace disease concerning for
scarring. Centrilobular and paraseptal emphysema.

Hepatobiliary: No focal liver abnormality is seen. No gallstones,
gallbladder wall thickening, or biliary dilatation.

Pancreas: Unremarkable. No pancreatic ductal dilatation or
surrounding inflammatory changes.

Spleen: Normal in size without focal abnormality.

Adrenals/Urinary Tract: Adrenal glands are unremarkable. Kidneys are
normal, without renal calculi, focal lesion, or hydronephrosis.
Bladder is unremarkable.

Stomach/Bowel: Small hiatal hernia. Appendix appears normal. No
evidence of bowel wall thickening, distention, or inflammatory
changes. Diverticulosis without evidence of diverticulitis.

Vascular/Lymphatic: Abdominal aortic atherosclerosis. Normal caliber
abdominal aorta. No lymphadenopathy.

Reproductive: Prostate is unremarkable.

Other: No abdominal wall hernia or abnormality. No abdominopelvic
ascites.

Musculoskeletal: No acute osseous abnormality. Mild osteoarthritis
of bilateral sacroiliac joints.
IMPRESSION: 1. No acute abdominal or pelvic pathology.
2.  Aortic Atherosclerosis ([id number].L).

## 2019-03-25 DIAGNOSIS — J449 Chronic obstructive pulmonary disease, unspecified: Secondary | ICD-10-CM | POA: Diagnosis not present

## 2019-04-11 ENCOUNTER — Telehealth: Payer: Self-pay | Admitting: Pharmacist

## 2019-04-11 NOTE — Telephone Encounter (Signed)
Sent in refill application for clofazimine. Medication should arrive to clinic in 3-5 business days. Will update encounter when medication arrives.  Patient has a follow up appointment with Dr. Linus Salmons on 10/5.

## 2019-04-13 NOTE — Telephone Encounter (Signed)
Patient's clofazimine has arrived to clinic (3 month supply). Will be in pharmacist's office. Patient comes to see Dr. Linus Salmons on 10/5.

## 2019-04-18 ENCOUNTER — Ambulatory Visit: Payer: Medicare Other | Admitting: Internal Medicine

## 2019-04-18 NOTE — Telephone Encounter (Signed)
Dang.

## 2019-04-18 NOTE — Telephone Encounter (Signed)
Nephrology Clinic Note    History of Present Illness:  Ryan [name] presents today for a follow-up evaluation for CKD3b.     Ryan [name] is a Black 58 year old man with past medical history notable for uncontrolled hypertension, uncontrolled IDDM 1 with diabetic retinopathy/nephropathy, recent episode of hypertensive emergency, demand ischemia, history of medication noncompliance.      His last appointment with me was on 04/19/2022 for a decreased kidney function. Initial work-up was notable for elevated K/L ratio without M-spike. ANCA/C3/C4/PLA2R antibody were unrevealing. A kidney biopsy was offerred but declined by the patient.  A kidney ultrasound reviewed evidence of medical renal disease. Lokelma 5 g and ferrous sulfate were started.  A course of therapeutic vitamin D was started.  Nifedipine dose was lowered.  Currently not on ACE/ARB.    Later he was hospitalized between 12/4-12/7 for SOB iso influenza A requiring BiPAP. Then again between 12/21-12/22 for HTN emergency with a  flash pulmonary edema along with an AKI on CKD.    Review of Systems:   As per HPI. All other systems reviewed are negative.    Home Medication:  Patient's Medications   New Prescriptions    No medications on file   Previous Medications    ASPIRIN 81 MG CHEWABLE TABLET    Take 1 tablet by mouth in the morning.    CARVEDILOL (COREG) 25 MG TABLET    Take 1 tablet by mouth in the morning and 1 tablet in the evening. Take with meals.    CONTINUOUS BLOOD GLUC SENSOR (DEXCOM G6 SENSOR) MISC    Use 1 each As Directed See Admin Instructions Use for 10 days    CONTINUOUS BLOOD GLUC TRANSMIT (DEXCOM G6 TRANSMITTER) MISC    Use 1 each As Directed See Admin Instructions Use for 3 mos    DOXAZOSIN (CARDURA) 1 MG TABLET    Take 1 tablet by mouth at bedtime    ERGOCALCIFEROL (VITAMIN D2) 50000 UNIT CAPSULE    Take 1 capsule by mouth once a week  for 12 doses    FERROUS SULFATE 325 (65 FE) MG EC TABLET    Take 1 tablet by mouth every  other day    HUMALOG 100 UNIT/ML INJECTION    Take up to 100 units daily through insulin pump    INSULIN ASPART (NOVOLOG) 100 UNIT/ML INJECTION    Inject up to 100 units daily via insulin pump    INSULIN DEGLUDEC (TRESIBA FLEXTOUCH) 100 UNIT/ML INJECTION    28 units nightly    INSULIN DISPOSABLE PUMP (OMNIPOD 5 G6 INTRO, GEN 5,) KIT    Use 1 each As Directed every third day    INSULIN DISPOSABLE PUMP (OMNIPOD 5 G6 POD, GEN 5,) MISC    Use 1 each As Directed every third day Change pods every 72 hours    INSULIN PEN NEEDLE (PEN NEEDLES) 32G X 4 MM MISC    Use 1 each As Directed 4 (four) times daily    NIFEDIPINE (ADALAT CC) 90 MG 24 HR TABLET    Take 1 tablet by mouth in the morning.    ROSUVASTATIN (CRESTOR) 10 MG TABLET    Take 1 tablet by mouth in the morning.    SODIUM ZIRCONIUM CYCLOSILICATE (LOKELMA) 5 G PACK    Take 1 packet by mouth in the morning.    TORSEMIDE 40 MG TABS    Take 40 mg by mouth in the morning.   Modified Medications      No medications on file   Discontinued Medications    No medications on file       Physical Exam:   Vital Signs: There were no vitals taken for this visit.    GENERAL: Normal appearance, well-developed  HEENT: Grossly within normal limit  LUNGS: Clear to auscultation  CARDIAC: Regular S1, S2, no gallop or murmur or rub  ABDOMEN: Soft, non tender, BS+, no CVA tenderness, no guarding or rebound tenderness.  EXTREMITIES: No edema  NEUROLOGIC: Grossly intact, no focal neurodeficits    Recent labs: Reviewed  Lab Results   Component Value Date    NA 144 07/04/2022    K 5.0 07/04/2022    CL 112 (H) 07/04/2022    CO2 21 07/04/2022    BUN 63 (H) 07/04/2022    CREAT 4.2 (H) 07/04/2022    GFR 18 (L) 07/04/2022    GLUCOSER 114 07/04/2022    CA 8.4 (L) 07/04/2022    PHOS 4.6 07/04/2022    ALBUMIN 2.3 (L) 07/03/2022    PTHINTACTWC 102 (H) 04/18/2022    VITD25H 19 (L) 07/03/2022       Lab Results   Component Value Date    WBC 6.1 07/04/2022    HGB 9.6 (L) 07/04/2022    HCT 29.5 (L) 07/04/2022     PLTA 308 07/04/2022       Urine Analysis: Reviewed  No results for input(s): "UACOL", "UACLA", "UAGLU", "UABIL", "UAKET", "SPEGRAVURINE", "UAOCC", "UAPH", "UAPRO", "UANIT", "LEUKOCYTES", "ALBCREATR" in the last 72 hours.    Imaging: Reviewed  No imaging results were found within the past 3 days.    Impression and Recommendations:    #CKD 3B  #Prolonged uncontrolled IDDM 1 with diabetic nephropathy and retinopathy  The most likely etiology of his CKD is likely due to prolonged poorly controlled diabetes and hypertension. There is possibly a significant microvascular disease as evidenced by highly fluctuating kidney function, possibly volume related. He also has evidence of diabetic retinopathy/nephropathy. His hemoglobin A1c remains highly elevated.  Currently on insulin.  Initially, I plan to start RAAS blockade/SGLT2 inhibitor which are contraindicated due to hyperkalemia/low GFR, respectively.  However, with his creatinine trending up consistently, lately, a thorough work-up should be pursued. Differential is broad including secondary FSGS/IgAN/paraprotein/ANCA.  - To get a renal ultrasound  - BMP today showed uptrending Cr, 2.9 with mildy elevated K, 5.4  - need a close follow up of his Cr, plan to repeat Cr in 2 weeks  -Urine showed coarse granular casts, no cellular casts, low concern for active GN. Possibly volume related Cr fluctuation, low BP noted today.   -UPCR 1.6g, UACR 0.2g, mainly non-albumin proteinuria, further w/u is warranted  -to check ANA, dsDNA ab, ANCA, SPEP/UPEP/FLC,C3,C4, and PLA2R ab, low threshold to get a kidney biopsy, will discuss with the patient with initial w/u results.     #Hyperkalemia  Possibly due to decreased kidney function/increase intake  - Low potassium diet  - to start Lokelma 5 g daily     #Hypertension, BP is controlled  - Previously on nifedipine 30 mg daily, now 60 mg daily. With elevated Cr and low BP, plan to lower the dose back to 30 mg daily  -the patient was  advised to monitor BP at home closely. He was advised to go back to 60 mg daily if his BP remains above 130/90, consistently.  - Low-salt diet     #Anemia  Iron 67, ferritin 172, transferring sat 28%, Hb 10  -to start   ferrous sulfate 325 mg every other day     #Lower extremity edema  Possible side effect of nifedipine and proteinuria  - To continue to monitor  - The patient was advised to elevate lower extremity when he is able to  - Low-salt diet  -to lower nifedipine to 30 mg     #Mineral Bone disease  #Hyperphosphatemia, 5.1  #Vitamin D deficiency, Vit D <6  #secondary hyperparathyroidism, PTH 102  -Low phosphorus diet  -start vit D 50,000 units weekly for 12 weeks then followed by 2,000 units daily thereafter     #Follow-up: ***    Voravech Nissaisorakarn, MD  Division of Nephrology    [facility] [address]  [address] [place] [address]  [phone]  07/10/2022        Answering Service: [phone]

## 2019-04-24 DIAGNOSIS — J449 Chronic obstructive pulmonary disease, unspecified: Secondary | ICD-10-CM | POA: Diagnosis not present

## 2019-05-04 ENCOUNTER — Inpatient Hospital Stay (HOSPITAL_COMMUNITY)
Admission: EM | Admit: 2019-05-04 | Discharge: 2019-05-29 | DRG: 291 | Disposition: A | Discharge status: Home Health | Payer: Medicare Other | Attending: Pulmonary Disease | Admitting: Pulmonary Disease

## 2019-05-04 ENCOUNTER — Other Ambulatory Visit: Payer: Self-pay

## 2019-05-04 ENCOUNTER — Encounter (HOSPITAL_COMMUNITY): Payer: Self-pay | Admitting: *Deleted

## 2019-05-04 ENCOUNTER — Emergency Department (HOSPITAL_COMMUNITY): Payer: Medicare Other

## 2019-05-04 DIAGNOSIS — Z7951 Long term (current) use of inhaled steroids: Secondary | ICD-10-CM | POA: Diagnosis not present

## 2019-05-04 DIAGNOSIS — K429 Umbilical hernia without obstruction or gangrene: Secondary | ICD-10-CM | POA: Diagnosis not present

## 2019-05-04 DIAGNOSIS — Z87891 Personal history of nicotine dependence: Secondary | ICD-10-CM

## 2019-05-04 DIAGNOSIS — J441 Chronic obstructive pulmonary disease with (acute) exacerbation: Secondary | ICD-10-CM | POA: Diagnosis not present

## 2019-05-04 DIAGNOSIS — J189 Pneumonia, unspecified organism: Secondary | ICD-10-CM | POA: Diagnosis not present

## 2019-05-04 DIAGNOSIS — G8929 Other chronic pain: Secondary | ICD-10-CM | POA: Diagnosis present

## 2019-05-04 DIAGNOSIS — I2510 Atherosclerotic heart disease of native coronary artery without angina pectoris: Secondary | ICD-10-CM | POA: Diagnosis present

## 2019-05-04 DIAGNOSIS — I5033 Acute on chronic diastolic (congestive) heart failure: Secondary | ICD-10-CM | POA: Diagnosis not present

## 2019-05-04 DIAGNOSIS — I110 Hypertensive heart disease with heart failure: Secondary | ICD-10-CM | POA: Diagnosis not present

## 2019-05-04 DIAGNOSIS — Z515 Encounter for palliative care: Secondary | ICD-10-CM

## 2019-05-04 DIAGNOSIS — J440 Chronic obstructive pulmonary disease with acute lower respiratory infection: Secondary | ICD-10-CM | POA: Diagnosis present

## 2019-05-04 DIAGNOSIS — R338 Other retention of urine: Secondary | ICD-10-CM | POA: Diagnosis not present

## 2019-05-04 DIAGNOSIS — Z794 Long term (current) use of insulin: Secondary | ICD-10-CM | POA: Diagnosis not present

## 2019-05-04 DIAGNOSIS — I480 Paroxysmal atrial fibrillation: Secondary | ICD-10-CM | POA: Diagnosis present

## 2019-05-04 DIAGNOSIS — D649 Anemia, unspecified: Secondary | ICD-10-CM | POA: Diagnosis not present

## 2019-05-04 DIAGNOSIS — Z833 Family history of diabetes mellitus: Secondary | ICD-10-CM | POA: Diagnosis not present

## 2019-05-04 DIAGNOSIS — E7800 Pure hypercholesterolemia, unspecified: Secondary | ICD-10-CM | POA: Diagnosis present

## 2019-05-04 DIAGNOSIS — J9611 Chronic respiratory failure with hypoxia: Secondary | ICD-10-CM | POA: Diagnosis not present

## 2019-05-04 DIAGNOSIS — K828 Other specified diseases of gallbladder: Secondary | ICD-10-CM | POA: Diagnosis not present

## 2019-05-04 DIAGNOSIS — F419 Anxiety disorder, unspecified: Secondary | ICD-10-CM | POA: Diagnosis present

## 2019-05-04 DIAGNOSIS — Z7189 Other specified counseling: Secondary | ICD-10-CM | POA: Diagnosis not present

## 2019-05-04 DIAGNOSIS — E119 Type 2 diabetes mellitus without complications: Secondary | ICD-10-CM

## 2019-05-04 DIAGNOSIS — K219 Gastro-esophageal reflux disease without esophagitis: Secondary | ICD-10-CM | POA: Diagnosis not present

## 2019-05-04 DIAGNOSIS — J449 Chronic obstructive pulmonary disease, unspecified: Secondary | ICD-10-CM | POA: Diagnosis not present

## 2019-05-04 DIAGNOSIS — R0989 Other specified symptoms and signs involving the circulatory and respiratory systems: Secondary | ICD-10-CM

## 2019-05-04 DIAGNOSIS — Z9981 Dependence on supplemental oxygen: Secondary | ICD-10-CM

## 2019-05-04 DIAGNOSIS — M7989 Other specified soft tissue disorders: Secondary | ICD-10-CM | POA: Diagnosis not present

## 2019-05-04 DIAGNOSIS — K4090 Unilateral inguinal hernia, without obstruction or gangrene, not specified as recurrent: Secondary | ICD-10-CM | POA: Diagnosis not present

## 2019-05-04 DIAGNOSIS — Z20828 Contact with and (suspected) exposure to other viral communicable diseases: Secondary | ICD-10-CM | POA: Diagnosis present

## 2019-05-04 DIAGNOSIS — I878 Other specified disorders of veins: Secondary | ICD-10-CM | POA: Diagnosis present

## 2019-05-04 DIAGNOSIS — R0789 Other chest pain: Secondary | ICD-10-CM | POA: Diagnosis not present

## 2019-05-04 DIAGNOSIS — I4891 Unspecified atrial fibrillation: Secondary | ICD-10-CM | POA: Diagnosis not present

## 2019-05-04 DIAGNOSIS — J8410 Pulmonary fibrosis, unspecified: Secondary | ICD-10-CM | POA: Diagnosis present

## 2019-05-04 DIAGNOSIS — H547 Unspecified visual loss: Secondary | ICD-10-CM | POA: Diagnosis present

## 2019-05-04 DIAGNOSIS — E785 Hyperlipidemia, unspecified: Secondary | ICD-10-CM | POA: Diagnosis present

## 2019-05-04 DIAGNOSIS — R0602 Shortness of breath: Principal | ICD-10-CM

## 2019-05-04 DIAGNOSIS — R069 Unspecified abnormalities of breathing: Secondary | ICD-10-CM | POA: Diagnosis not present

## 2019-05-04 DIAGNOSIS — N401 Enlarged prostate with lower urinary tract symptoms: Secondary | ICD-10-CM | POA: Diagnosis present

## 2019-05-04 DIAGNOSIS — E1165 Type 2 diabetes mellitus with hyperglycemia: Secondary | ICD-10-CM | POA: Diagnosis present

## 2019-05-04 DIAGNOSIS — K921 Melena: Secondary | ICD-10-CM | POA: Diagnosis present

## 2019-05-04 DIAGNOSIS — I509 Heart failure, unspecified: Secondary | ICD-10-CM

## 2019-05-04 DIAGNOSIS — Z79899 Other long term (current) drug therapy: Secondary | ICD-10-CM | POA: Diagnosis not present

## 2019-05-04 DIAGNOSIS — R109 Unspecified abdominal pain: Secondary | ICD-10-CM | POA: Diagnosis not present

## 2019-05-04 DIAGNOSIS — B370 Candidal stomatitis: Secondary | ICD-10-CM | POA: Diagnosis not present

## 2019-05-04 DIAGNOSIS — R05 Cough: Secondary | ICD-10-CM | POA: Diagnosis not present

## 2019-05-04 DIAGNOSIS — J9621 Acute and chronic respiratory failure with hypoxia: Secondary | ICD-10-CM | POA: Diagnosis not present

## 2019-05-04 DIAGNOSIS — R609 Edema, unspecified: Secondary | ICD-10-CM

## 2019-05-04 DIAGNOSIS — R0600 Dyspnea, unspecified: Secondary | ICD-10-CM | POA: Diagnosis present

## 2019-05-04 DIAGNOSIS — R600 Localized edema: Secondary | ICD-10-CM | POA: Diagnosis present

## 2019-05-04 DIAGNOSIS — A310 Pulmonary mycobacterial infection: Secondary | ICD-10-CM | POA: Diagnosis present

## 2019-05-04 DIAGNOSIS — R32 Unspecified urinary incontinence: Secondary | ICD-10-CM | POA: Diagnosis present

## 2019-05-04 DIAGNOSIS — H539 Unspecified visual disturbance: Secondary | ICD-10-CM

## 2019-05-04 DIAGNOSIS — T380X5A Adverse effect of glucocorticoids and synthetic analogues, initial encounter: Secondary | ICD-10-CM | POA: Diagnosis present

## 2019-05-04 DIAGNOSIS — R000 Tachycardia, unspecified: Secondary | ICD-10-CM | POA: Diagnosis not present

## 2019-05-04 DIAGNOSIS — I5032 Chronic diastolic (congestive) heart failure: Secondary | ICD-10-CM | POA: Diagnosis not present

## 2019-05-04 LAB — CBC WITH DIFFERENTIAL/PLATELET
Abs Immature Granulocytes: 0.05 10*3/uL (ref 0.00–0.07)
Basophils Absolute: 0 10*3/uL (ref 0.0–0.1)
Basophils Relative: 1 %
Eosinophils Absolute: 0.5 10*3/uL (ref 0.0–0.5)
Eosinophils Relative: 7 %
HCT: 38.3 % — ABNORMAL LOW (ref 39.0–52.0)
Hemoglobin: 11.9 g/dL — ABNORMAL LOW (ref 13.0–17.0)
Immature Granulocytes: 1 %
Lymphocytes Relative: 18 %
Lymphs Abs: 1.2 10*3/uL (ref 0.7–4.0)
MCH: 27.7 pg (ref 26.0–34.0)
MCHC: 31.1 g/dL (ref 30.0–36.0)
MCV: 89.1 fL (ref 80.0–100.0)
Monocytes Absolute: 0.5 10*3/uL (ref 0.1–1.0)
Monocytes Relative: 8 %
Neutro Abs: 4.6 10*3/uL (ref 1.7–7.7)
Neutrophils Relative %: 65 %
Platelets: 263 10*3/uL (ref 150–400)
RBC: 4.3 MIL/uL (ref 4.22–5.81)
RDW: 14.6 % (ref 11.5–15.5)
WBC: 6.9 10*3/uL (ref 4.0–10.5)
nRBC: 0 % (ref 0.0–0.2)

## 2019-05-04 LAB — COMPREHENSIVE METABOLIC PANEL
ALT: 55 U/L — ABNORMAL HIGH (ref 0–44)
AST: 28 U/L (ref 15–41)
Albumin: 2.9 g/dL — ABNORMAL LOW (ref 3.5–5.0)
Alkaline Phosphatase: 65 U/L (ref 38–126)
Anion gap: 8 (ref 5–15)
BUN: 12 mg/dL (ref 6–20)
CO2: 30 mmol/L (ref 22–32)
Calcium: 8.3 mg/dL — ABNORMAL LOW (ref 8.9–10.3)
Chloride: 99 mmol/L (ref 98–111)
Creatinine, Ser: 1.5 mg/dL — ABNORMAL HIGH (ref 0.61–1.24)
GFR calc Af Amer: 58 mL/min — ABNORMAL LOW (ref >60)
GFR calc non Af Amer: 50 mL/min — ABNORMAL LOW (ref >60)
Glucose, Bld: 210 mg/dL — ABNORMAL HIGH (ref 70–99)
Potassium: 3.7 mmol/L (ref 3.5–5.1)
Sodium: 137 mmol/L (ref 135–145)
Total Bilirubin: 0.4 mg/dL (ref 0.3–1.2)
Total Protein: 7 g/dL (ref 6.5–8.1)

## 2019-05-04 LAB — SARS CORONAVIRUS 2 (TAT 6-24 HRS): SARS Coronavirus 2: NEGATIVE

## 2019-05-04 LAB — GLUCOSE, CAPILLARY
Glucose-Capillary: 282 mg/dL — ABNORMAL HIGH (ref 70–99)
Glucose-Capillary: 403 mg/dL — ABNORMAL HIGH (ref 70–99)

## 2019-05-04 LAB — TROPONIN I (HIGH SENSITIVITY)
Troponin I (High Sensitivity): 2 ng/L (ref <18)
Troponin I (High Sensitivity): 2 ng/L (ref <18)

## 2019-05-04 LAB — BRAIN NATRIURETIC PEPTIDE: B Natriuretic Peptide: 40 pg/mL (ref 0.0–100.0)

## 2019-05-04 LAB — POC OCCULT BLOOD, ED: Fecal Occult Bld: POSITIVE — AB

## 2019-05-04 LAB — HEMOGLOBIN A1C
Hgb A1c MFr Bld: 9.3 % — ABNORMAL HIGH (ref 4.8–5.6)
Mean Plasma Glucose: 220.21 mg/dL

## 2019-05-04 LAB — CBG MONITORING, ED
Glucose-Capillary: 279 mg/dL — ABNORMAL HIGH (ref 70–99)
Glucose-Capillary: 283 mg/dL — ABNORMAL HIGH (ref 70–99)
Glucose-Capillary: 313 mg/dL — ABNORMAL HIGH (ref 70–99)

## 2019-05-04 MED ORDER — DILTIAZEM HCL ER COATED BEADS 120 MG PO CP24
120.0000 mg | ORAL_CAPSULE | Freq: Every day | ORAL | Status: DC
  Administered 2019-05-04 – 2019-05-29 (×26): 120 mg via ORAL
  Filled 2019-05-04 (×28): qty 1

## 2019-05-04 MED ORDER — ALBUTEROL SULFATE HFA 108 (90 BASE) MCG/ACT IN AERS
8.0000 | INHALATION_SPRAY | Freq: Once | RESPIRATORY_TRACT | Status: AC
  Administered 2019-05-04: 8 via RESPIRATORY_TRACT

## 2019-05-04 MED ORDER — LORAZEPAM 0.5 MG PO TABS
0.5000 mg | ORAL_TABLET | Freq: Two times a day (BID) | ORAL | Status: DC | PRN
  Administered 2019-05-04 – 2019-05-09 (×8): 0.5 mg via ORAL
  Filled 2019-05-04 (×8): qty 1

## 2019-05-04 MED ORDER — POLYETHYLENE GLYCOL 3350 17 G PO PACK: 17.0000 g | PACK | Freq: Every day | ORAL | Status: DC | PRN

## 2019-05-04 MED ORDER — LORATADINE 10 MG PO TABS
10.0000 mg | ORAL_TABLET | Freq: Every day | ORAL | Status: DC
  Administered 2019-05-04 – 2019-05-29 (×26): 10 mg via ORAL
  Filled 2019-05-04 (×26): qty 1

## 2019-05-04 MED ORDER — POTASSIUM CHLORIDE CRYS ER 20 MEQ PO TBCR
20.0000 meq | EXTENDED_RELEASE_TABLET | Freq: Every day | ORAL | Status: DC
  Administered 2019-05-04 – 2019-05-07 (×4): 20 meq via ORAL
  Filled 2019-05-04 (×4): qty 1

## 2019-05-04 MED ORDER — TAMSULOSIN HCL 0.4 MG PO CAPS
0.4000 mg | ORAL_CAPSULE | Freq: Two times a day (BID) | ORAL | Status: DC
  Administered 2019-05-04 – 2019-05-29 (×51): 0.4 mg via ORAL
  Filled 2019-05-04 (×51): qty 1

## 2019-05-04 MED ORDER — FUROSEMIDE 10 MG/ML IJ SOLN
40.0000 mg | Freq: Two times a day (BID) | INTRAMUSCULAR | Status: DC
  Administered 2019-05-04 – 2019-05-07 (×8): 40 mg via INTRAVENOUS
  Filled 2019-05-04 (×8): qty 4

## 2019-05-04 MED ORDER — NITROGLYCERIN 0.4 MG SL SUBL: 0.4000 mg | SUBLINGUAL_TABLET | SUBLINGUAL | Status: DC | PRN

## 2019-05-04 MED ORDER — ACETAMINOPHEN 325 MG PO TABS: 650.0000 mg | ORAL_TABLET | Freq: Four times a day (QID) | ORAL | Status: DC | PRN

## 2019-05-04 MED ORDER — HEPARIN SODIUM (PORCINE) 5000 UNIT/ML IJ SOLN
5000.0000 [IU] | Freq: Three times a day (TID) | INTRAMUSCULAR | Status: DC
  Administered 2019-05-04 – 2019-05-29 (×75): 5000 [IU] via SUBCUTANEOUS
  Filled 2019-05-04 (×76): qty 1

## 2019-05-04 MED ORDER — PREDNISONE 20 MG PO TABS
20.0000 mg | ORAL_TABLET | Freq: Every day | ORAL | Status: DC
  Administered 2019-05-04: 20 mg via ORAL
  Filled 2019-05-04: qty 1

## 2019-05-04 MED ORDER — ALBUTEROL SULFATE HFA 108 (90 BASE) MCG/ACT IN AERS
4.0000 | INHALATION_SPRAY | Freq: Once | RESPIRATORY_TRACT | Status: AC
  Administered 2019-05-04: 4 via RESPIRATORY_TRACT

## 2019-05-04 MED ORDER — SODIUM CHLORIDE 0.9 % IV SOLN
1.0000 g | INTRAVENOUS | Status: DC
  Administered 2019-05-05 – 2019-05-12 (×8): 1 g via INTRAVENOUS
  Filled 2019-05-04 (×8): qty 10

## 2019-05-04 MED ORDER — ROSUVASTATIN CALCIUM 10 MG PO TABS
10.0000 mg | ORAL_TABLET | Freq: Every morning | ORAL | Status: DC
  Administered 2019-05-04 – 2019-05-29 (×26): 10 mg via ORAL
  Filled 2019-05-04 (×28): qty 1

## 2019-05-04 MED ORDER — MAGNESIUM OXIDE 400 (241.3 MG) MG PO TABS
200.0000 mg | ORAL_TABLET | Freq: Every day | ORAL | Status: DC
  Administered 2019-05-04 – 2019-05-29 (×26): 200 mg via ORAL
  Filled 2019-05-04 (×27): qty 1

## 2019-05-04 MED ORDER — METHYLPREDNISOLONE SODIUM SUCC 40 MG IJ SOLR
40.0000 mg | Freq: Two times a day (BID) | INTRAMUSCULAR | Status: DC
  Administered 2019-05-04 – 2019-05-10 (×12): 40 mg via INTRAVENOUS
  Filled 2019-05-04 (×12): qty 1

## 2019-05-04 MED ORDER — PANTOPRAZOLE SODIUM 40 MG PO TBEC
40.0000 mg | DELAYED_RELEASE_TABLET | Freq: Every day | ORAL | Status: DC
  Administered 2019-05-04: 40 mg via ORAL
  Filled 2019-05-04: qty 1

## 2019-05-04 MED ORDER — SODIUM CHLORIDE 0.9 % IV SOLN: 250.0000 mL | INTRAVENOUS | Status: DC | PRN

## 2019-05-04 MED ORDER — SODIUM CHLORIDE 0.9 % IV SOLN
1.0000 g | Freq: Once | INTRAVENOUS | Status: AC
  Administered 2019-05-04: 1 g via INTRAVENOUS
  Filled 2019-05-04: qty 10

## 2019-05-04 MED ORDER — INSULIN ASPART 100 UNIT/ML ~~LOC~~ SOLN: 0.0000 [IU] | Freq: Three times a day (TID) | SUBCUTANEOUS | Status: DC

## 2019-05-04 MED ORDER — GUAIFENESIN-CODEINE 100-10 MG/5ML PO SOLN
10.0000 mL | ORAL | Status: DC | PRN
  Administered 2019-05-04 – 2019-05-05 (×4): 10 mL via ORAL
  Filled 2019-05-04 (×4): qty 10

## 2019-05-04 MED ORDER — IPRATROPIUM-ALBUTEROL 0.5-2.5 (3) MG/3ML IN SOLN
3.0000 mL | Freq: Four times a day (QID) | RESPIRATORY_TRACT | Status: DC
  Administered 2019-05-04 – 2019-05-05 (×2): 3 mL via RESPIRATORY_TRACT
  Filled 2019-05-04 (×2): qty 3

## 2019-05-04 MED ORDER — ALFUZOSIN HCL ER 10 MG PO TB24
10.0000 mg | ORAL_TABLET | Freq: Every day | ORAL | Status: DC
  Administered 2019-05-04: 10 mg via ORAL
  Filled 2019-05-04 (×3): qty 1

## 2019-05-04 MED ORDER — OXYCODONE-ACETAMINOPHEN 5-325 MG PO TABS
2.0000 | ORAL_TABLET | ORAL | Status: DC | PRN
  Administered 2019-05-04 – 2019-05-29 (×135): 2 via ORAL
  Filled 2019-05-04 (×138): qty 2

## 2019-05-04 MED ORDER — ONDANSETRON HCL 4 MG/2ML IJ SOLN
4.0000 mg | Freq: Four times a day (QID) | INTRAMUSCULAR | Status: DC | PRN
  Administered 2019-05-04 – 2019-05-22 (×2): 4 mg via INTRAVENOUS
  Filled 2019-05-04 (×2): qty 2

## 2019-05-04 MED ORDER — MOMETASONE FURO-FORMOTEROL FUM 200-5 MCG/ACT IN AERO
2.0000 | INHALATION_SPRAY | Freq: Two times a day (BID) | RESPIRATORY_TRACT | Status: DC
  Administered 2019-05-04 – 2019-05-29 (×50): 2 via RESPIRATORY_TRACT
  Filled 2019-05-04 (×2): qty 8.8

## 2019-05-04 MED ORDER — INSULIN ASPART 100 UNIT/ML ~~LOC~~ SOLN
0.0000 [IU] | Freq: Every day | SUBCUTANEOUS | Status: DC
  Administered 2019-05-05: 3 [IU] via SUBCUTANEOUS
  Administered 2019-05-05: 5 [IU] via SUBCUTANEOUS

## 2019-05-04 MED ORDER — RIFAMPIN 300 MG PO CAPS
600.0000 mg | ORAL_CAPSULE | Freq: Every day | ORAL | Status: DC
  Administered 2019-05-04 – 2019-05-29 (×26): 600 mg via ORAL
  Filled 2019-05-04 (×28): qty 2

## 2019-05-04 MED ORDER — SODIUM CHLORIDE 0.9 % IV SOLN
500.0000 mg | Freq: Once | INTRAVENOUS | Status: AC
  Administered 2019-05-04: 500 mg via INTRAVENOUS
  Filled 2019-05-04: qty 500

## 2019-05-04 MED ORDER — SODIUM CHLORIDE 0.9% FLUSH
3.0000 mL | Freq: Two times a day (BID) | INTRAVENOUS | Status: DC
  Administered 2019-05-04 – 2019-05-29 (×46): 3 mL via INTRAVENOUS

## 2019-05-04 MED ORDER — TRAZODONE HCL 50 MG PO TABS
50.0000 mg | ORAL_TABLET | Freq: Every evening | ORAL | Status: DC | PRN
  Administered 2019-05-06 – 2019-05-19 (×2): 50 mg via ORAL
  Filled 2019-05-04 (×5): qty 1

## 2019-05-04 MED ORDER — ALBUTEROL SULFATE (2.5 MG/3ML) 0.083% IN NEBU: 2.5000 mg | INHALATION_SOLUTION | RESPIRATORY_TRACT | Status: DC | PRN

## 2019-05-04 MED ORDER — FLUTICASONE-UMECLIDIN-VILANT 100-62.5-25 MCG/INH IN AEPB: 1.0000 | INHALATION_SPRAY | Freq: Every day | RESPIRATORY_TRACT | Status: DC

## 2019-05-04 MED ORDER — BENZONATATE 100 MG PO CAPS
200.0000 mg | ORAL_CAPSULE | Freq: Three times a day (TID) | ORAL | Status: DC
  Administered 2019-05-04 – 2019-05-29 (×76): 200 mg via ORAL
  Filled 2019-05-04 (×77): qty 2

## 2019-05-04 MED ORDER — UMECLIDINIUM-VILANTEROL 62.5-25 MCG/INH IN AEPB
1.0000 | INHALATION_SPRAY | Freq: Every day | RESPIRATORY_TRACT | Status: DC
  Administered 2019-05-06 – 2019-05-29 (×24): 1 via RESPIRATORY_TRACT
  Filled 2019-05-04 (×4): qty 14

## 2019-05-04 MED ORDER — ONDANSETRON HCL 4 MG PO TABS: 4.0000 mg | ORAL_TABLET | Freq: Four times a day (QID) | ORAL | Status: DC | PRN

## 2019-05-04 MED ORDER — METFORMIN HCL 500 MG PO TABS
500.0000 mg | ORAL_TABLET | Freq: Every day | ORAL | Status: DC
  Administered 2019-05-04 – 2019-05-29 (×26): 500 mg via ORAL
  Filled 2019-05-04 (×26): qty 1

## 2019-05-04 MED ORDER — FUROSEMIDE 10 MG/ML IJ SOLN
80.0000 mg | Freq: Once | INTRAMUSCULAR | Status: AC
  Administered 2019-05-04: 80 mg via INTRAVENOUS
  Filled 2019-05-04: qty 8

## 2019-05-04 MED ORDER — GUAIFENESIN ER 600 MG PO TB12
600.0000 mg | ORAL_TABLET | Freq: Two times a day (BID) | ORAL | Status: DC
  Administered 2019-05-04 – 2019-05-29 (×51): 600 mg via ORAL
  Filled 2019-05-04 (×51): qty 1

## 2019-05-04 MED ORDER — AZITHROMYCIN 250 MG PO TABS
500.0000 mg | ORAL_TABLET | Freq: Every day | ORAL | Status: DC
  Administered 2019-05-04 – 2019-05-29 (×27): 500 mg via ORAL
  Filled 2019-05-04 (×26): qty 2

## 2019-05-04 MED ORDER — SODIUM CHLORIDE 0.9% FLUSH: 3.0000 mL | INTRAVENOUS | Status: DC | PRN

## 2019-05-04 MED ORDER — ALBUTEROL SULFATE HFA 108 (90 BASE) MCG/ACT IN AERS
4.0000 | INHALATION_SPRAY | Freq: Once | RESPIRATORY_TRACT | Status: AC
  Administered 2019-05-04: 4 via RESPIRATORY_TRACT
  Filled 2019-05-04: qty 6.7

## 2019-05-04 MED ORDER — DIPHENHYDRAMINE HCL 25 MG PO CAPS
25.0000 mg | ORAL_CAPSULE | Freq: Three times a day (TID) | ORAL | Status: DC | PRN
  Administered 2019-05-06: 22:00:00 25 mg via ORAL
  Filled 2019-05-04: qty 1

## 2019-05-04 MED ORDER — ACETAMINOPHEN 650 MG RE SUPP
650.0000 mg | Freq: Four times a day (QID) | RECTAL | Status: DC | PRN
  Filled 2019-05-04: qty 1

## 2019-05-04 MED ORDER — INSULIN ASPART 100 UNIT/ML ~~LOC~~ SOLN
0.0000 [IU] | Freq: Three times a day (TID) | SUBCUTANEOUS | Status: DC
  Administered 2019-05-04: 7 [IU] via SUBCUTANEOUS
  Administered 2019-05-04 – 2019-05-05 (×3): 5 [IU] via SUBCUTANEOUS
  Administered 2019-05-05: 9 [IU] via SUBCUTANEOUS
  Administered 2019-05-05: 2 [IU] via SUBCUTANEOUS
  Administered 2019-05-06: 5 [IU] via SUBCUTANEOUS
  Filled 2019-05-04 (×2): qty 1

## 2019-05-04 MED ORDER — IPRATROPIUM BROMIDE 0.02 % IN SOLN
0.5000 mg | Freq: Two times a day (BID) | RESPIRATORY_TRACT | Status: DC
  Administered 2019-05-04 – 2019-05-05 (×3): 0.5 mg via RESPIRATORY_TRACT
  Filled 2019-05-04 (×3): qty 2.5

## 2019-05-04 MED ORDER — INSULIN GLARGINE 100 UNIT/ML ~~LOC~~ SOLN
40.0000 [IU] | Freq: Every day | SUBCUTANEOUS | Status: DC
  Administered 2019-05-04 – 2019-05-05 (×2): 40 [IU] via SUBCUTANEOUS
  Filled 2019-05-04 (×4): qty 0.4

## 2019-05-04 MED ORDER — BUDESONIDE 0.25 MG/2ML IN SUSP
0.2500 mg | Freq: Two times a day (BID) | RESPIRATORY_TRACT | Status: DC
  Administered 2019-05-04 – 2019-05-05 (×3): 0.25 mg via RESPIRATORY_TRACT
  Filled 2019-05-04 (×3): qty 2

## 2019-05-04 NOTE — H&P (Signed)
Patient Demographics:    Ryan [name], is a 58 y.o. male  MRN: [id number]   DOB - May 25, 1961  Admit Date - 05/04/2019  Outpatient Primary MD for the patient is [name], MD   Assessment & Plan:    Active Problems:   Dyspnea   Acute exacerbation of CHF (congestive heart failure) ([place])    1) presumed right-sided pneumonia--treat empirically with Rocephin and azithromycin, bronchodilators mucolytics as ordered --Continue supplemental oxygen  2)HFpEF--last known EF 55 to 60%, patient with evidence of congestion on chest x-ray, worsening hypoxia, worsening bilateral lower extremity edema, dyspnea at rest and dyspnea on exertion -BNP noted, treat empirically with IV Lasix 40 mg every 12 hours,, daily weight and fluid input and output monitoring as ordered  3) acute exacerbation COPD and pulmonary fibrosis--antibiotics as above #1, Solu-Medrol mucolytics compression Alythia/ordered  4) acute on chronic hypoxic respiratory failure secondary to combination of #1, #2 #3 above -Treat as above  5)DM2-A1c is 9.3 reflecting uncontrolled diabetes, anticipate worsening hyperglycemia with steroids, Lantus insulin 40 units daily along with sliding scale NovoLog  6) mild anemia--- hemoglobin is 11.9, asymptomatic Eliza, stool was somewhat dark and heme positive, -Continue Protonix -Monitor H&H if H&H continues to drop consider inpatient GI consult otherwise deferred to outpatient as patient current cardiopulmonary status precludes endoluminal evaluation at this time   With History of - Reviewed by me  Past Medical History:  Diagnosis Date  . Atrial fibrillation ([place])    Not anticoagulated  . Barrett's esophagus   . Borderline diabetes   . Bronchitis   . CAP (community acquired pneumonia) 07/10/2013   04/2015   . 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 Chronic back pain   . Chronic diastolic CHF (congestive heart failure) ([place])   . Chronic left shoulder pain   . Chronic neck pain   . Chronic respiratory failure ([place])   . Collagen vascular disease ([place])   . COPD (chronic obstructive pulmonary disease) ([place])   . Coronary atherosclerosis of native coronary artery    Mild nonobstructive disease at catheterization 2007  . DDD (degenerative disc disease)    Cervical and thoracic  . Diastolic heart failure ([place])   . GERD (gastroesophageal reflux disease)   . History of pneumonia   . Hypercholesteremia   . On home O2    6L N/C  . Polycythemia   . Seizures ([place])    Last seizure 2 yrs ago  . Smoker   . Type 2 diabetes mellitus ([place])   . Type 2 diabetes mellitus without complication [facility])       Past Surgical History:  Procedure Laterality Date  . COLONOSCOPY  2012   Dr. Posey Pronto: normal  . ESOPHAGOGASTRODUODENOSCOPY N/A 05/04/2015   Dr. Michail Sermon: minimal erosive esophagitis, small hiatal hernia. FOOD PRECLUDED A COMPLETE EXAM. No biopsies taken  . ESOPHAGOGASTRODUODENOSCOPY  2011   [place]: Barrett's   . ESOPHAGOGASTRODUODENOSCOPY (  EGD) WITH PROPOFOL N/A 05/06/2018   Dr. Gala Romney: erosive reflux esophagitis and +candida, Schatzki's ring s/p dilation, hiatal hernia.  [name] LUNG BIOPSY    . MALONEY DILATION N/A 05/06/2018   Procedure: Venia Minks DILATION;  Surgeon: Daneil Dolin, MD;  Location: [facility];  Service: Endoscopy;  Laterality: N/A;  . Throat biopsy    . VASECTOMY    . [place]      Chief Complaint  Patient presents with  . Shortness of Breath      HPI:    Ryan [name]  is a 58 y.o. male with past medical history relevant for chronic hypoxic respiratory failure on 6 L of oxygen at home, DM, HTN, COPD, CHF, HLD and GERD who presents to the ED with worsening shortness of breath and cough -worsening hypoxia, worsening bilateral lower  extremity edema, dyspnea at rest and dyspnea on exertion  --By EMS patient required nonrebreather bag due to persistent hypoxia at home, he received IV Solu-Medrol from EMS  - Covid19 negative -Chest x-ray with pulmonary venous congestion as well as concerns about possible right lung base pneumonia with effusion  BNP is only 40, previous baseline between 17 and 21 -Troponin is not elevated -No fevers no chills, WBC 6.9    Review of systems:    In addition to the HPI above,   A full Review of  Systems was done, all other systems reviewed are negative except as noted above in HPI , .    Social History:  Reviewed by me    Social History   Tobacco Use  . Smoking status: Former Smoker    Packs/day: 0.50    Years: 45.00    Pack years: 22.50    Types: Cigarettes    Quit date: 05/01/2015    Years since quitting: 4.0  . Smokeless tobacco: Never Used  Substance Use Topics  . Alcohol use: No    Alcohol/week: 0.0 standard drinks       Family History :  Reviewed by me    Family History  Problem Relation Age of Onset  . Hypertension Mother   . Diabetes Mother   . Heart attack Mother   . Heart failure Mother   . Hypertension Sister   . Diabetes Sister   . Heart failure Sister   . Colon cancer Neg Hx     Home Medications:   Prior to Admission medications   Medication Sig Start Date End Date Taking? Authorizing Provider  albuterol (VENTOLIN HFA) 108 (90 BASE) MCG/ACT inhaler Inhale 2 puffs into the lungs every 6 (six) hours as needed for wheezing or shortness of breath.   Yes [provider]  alfuzosin (UROXATRAL) 10 MG 24 hr tablet Take 10 mg by mouth daily with breakfast.   Yes [provider]  azithromycin (ZITHROMAX) 500 MG tablet 1 Tab daily per Dr Ivan Anchors medication 09/20/18  Yes Ardis Fullwood, MD  benzonatate (TESSALON) 200 MG capsule Take 200 mg by mouth 3 (three) times daily.   Yes [provider]  budesonide (PULMICORT)  0.25 MG/2ML nebulizer solution Take 2 mLs (0.25 mg total) by nebulization 2 (two) times daily. 10/24/17  Yes [name], MD  CARTIA XT 120 MG 24 hr capsule Take 1 capsule (120 mg total) by mouth daily. 01/01/18  Yes BranchAlphonse Guild, MD  diphenhydrAMINE (BENADRYL) 25 mg capsule Take 25 mg by mouth every 8 (eight) hours as needed. Felt mouth swelling at ED visit 08/16/18.   Yes [provider]  doxycycline (VIBRAMYCIN) 100 MG capsule TK 1 C PO BID 09/22/18  Yes [provider]  fexofenadine (ALLEGRA) 180 MG tablet Take 180 mg by mouth daily.   Yes [provider]  fluticasone (FLONASE) 50 MCG/ACT nasal spray Place 2 sprays into both nostrils daily as needed.  02/24/18  Yes [provider]  Fluticasone-Umeclidin-Vilant (TRELEGY ELLIPTA) 100-62.5-25 MCG/INH AEPB Inhale 1 puff into the lungs daily.   Yes [provider]  furosemide (LASIX) 40 MG tablet Take 1 tablet (40 mg total) by mouth daily. As needed for swelling 09/10/18 09/10/19 Yes [name], MD  insulin glargine (LANTUS) 100 UNIT/ML injection Inject 0.4 mLs (40 Units total) into the skin daily. 24 units daily 09/19/18  Yes Vidhi Delellis, MD  insulin lispro (HUMALOG) 100 UNIT/ML injection Take by sliding scale provided by hospital maximum amount daily is 60 units.  Check blood sugar 4 times a day Patient taking differently: Inject 0-15 Units into the skin 3 (three) times daily with meals. Take by sliding scale provided by hospital maximum amount daily is 60 units.  Check blood sugar 4 times a day 09/23/17  Yes [name], MD  ipratropium (ATROVENT) 0.02 % nebulizer solution Take 0.5 mg by nebulization 2 (two) times daily.  09/29/17  Yes [provider]  levalbuterol Penne Lash) 0.63 MG/3ML nebulizer solution Take 0.63 mg by nebulization 2 (two) times daily.   Yes [provider]  LORazepam (ATIVAN) 0.5 MG tablet Take 1 tablet (0.5 mg total) by mouth 2 (two) times daily as needed  for anxiety or sleep. Patient taking differently: Take 0.5 mg by mouth 4 (four) times daily. Pt takes it three times daily. 05/04/15  Yes Mikhail, Maryann, DO  magnesium 30 MG tablet Take 30 mg by mouth daily.   Yes [provider]  metFORMIN (GLUCOPHAGE) 500 MG tablet Take 1 tablet (500 mg total) by mouth daily with breakfast. 09/15/14  Yes Rexene Alberts, MD  montelukast (SINGULAIR) 10 MG tablet Take 1 tablet by mouth daily. 02/17/19  Yes [provider]  nitroGLYCERIN (NITROSTAT) 0.4 MG SL tablet Place 1 tablet (0.4 mg total) under the tongue every 5 (five) minutes as needed for chest pain. 04/22/13  Yes Lendon Colonel, NP  omeprazole (PRILOSEC) 20 MG capsule Take 1 capsule (20 mg total) by mouth 2 (two) times daily before a meal. 08/04/18  Yes Annitta Needs, NP  oxyCODONE-acetaminophen (PERCOCET) 10-325 MG tablet Take 1 tablet by mouth every 4 (four) hours as needed for pain.   Yes [provider]  potassium chloride SA (K-DUR,KLOR-CON) 20 MEQ tablet Take 1 tablet twice daily while on Lasix 09/10/18  Yes [name], MD  predniSONE (DELTASONE) 10 MG tablet Take 2 daily for 1 week then 1 daily Patient taking differently: Take 20 mg by mouth daily with breakfast. Takes 2 tablets daily. 10/24/17  Yes [name], MD  rifampin (RIFADIN) 300 MG capsule Take 2 capsules (600 mg total) by mouth daily. Long-term medication 09/19/18  Yes Kahner Yanik, MD  rosuvastatin (CRESTOR) 10 MG tablet Take 10 mg by mouth every morning.  04/10/18  Yes [provider]  SYMBICORT 160-4.5 MCG/ACT inhaler INL 2 PFS PO BID 09/23/18  Yes [provider]  tamsulosin (FLOMAX) 0.4 MG CAPS capsule Take 1 capsule (0.4 mg total) by mouth 2 (two) times daily. 09/19/18  Yes Roxan Hockey, MD  acetaminophen (TYLENOL) 325 MG tablet Take 2 tablets (650 mg total) by mouth every 6 (six) hours as needed for  mild pain, moderate pain, fever or headache (or Fever >/= 101). Patient not  taking: Reported on 05/04/2019 09/19/18   Roxan Hockey, MD     Allergies:     Allergies  Allergen Reactions  . Albuterol Palpitations  . 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 Influenza Vaccine Live Swelling     Physical Exam:   Vitals  Blood pressure 127/67, pulse 100, temperature 98 F (36.7 C), temperature source Oral, resp. rate (!) 22, height 5\' 8"  (1.727 m), weight 81.6 kg, SpO2 99 %.  Physical Examination: General appearance -awake, able to speak in short sentences  mental status - alert, oriented to person, place, and time,  Eyes - sclera anicteric Nose- High flow oxygen Neck - supple, no JVD elevation , Chest -diminished bilaterally, scattered wheezes, right-sided rhonchi Heart - S1 and S2 normal, regular  Abdomen - soft, nontender, nondistended, no masses or organomegaly Neurological - screening mental status exam normal, neck supple without rigidity, cranial nerves II through XII intact, DTR's normal and symmetric Extremities -2-3+ pitting pedal edema noted, intact peripheral pulses  Skin - warm, dry     Data Review:    CBC Recent Labs  Lab 05/04/19 0227  WBC 6.9  HGB 11.9*  HCT 38.3*  PLT 263  MCV 89.1  MCH 27.7  MCHC 31.1  RDW 14.6  LYMPHSABS 1.2  MONOABS 0.5  EOSABS 0.5  BASOSABS 0.0   ------------------------------------------------------------------------------------------------------------------  Chemistries  Recent Labs  Lab 05/04/19 0227  NA 137  K 3.7  CL 99  CO2 30  GLUCOSE 210*  BUN 12  CREATININE 1.50*  CALCIUM 8.3*  AST 28  ALT 55*  ALKPHOS 65  BILITOT 0.4   ------------------------------------------------------------------------------------------------------------------ estimated creatinine clearance is 50.7 mL/min (A) (by C-G formula based on SCr of 1.5 mg/dL  (H)). ------------------------------------------------------------------------------------------------------------------ No results for input(s): TSH, T4TOTAL, T3FREE, THYROIDAB in the last 72 hours.  Invalid input(s): FREET3   Coagulation profile No results for input(s): INR, PROTIME in the last 168 hours. ------------------------------------------------------------------------------------------------------------------- No results for input(s): DDIMER in the last 72 hours. -------------------------------------------------------------------------------------------------------------------  Cardiac Enzymes No results for input(s): CKMB, TROPONINI, MYOGLOBIN in the last 168 hours.  Invalid input(s): CK ------------------------------------------------------------------------------------------------------------------    Component Value Date/Time   BNP 40.0 05/04/2019 0227     ---------------------------------------------------------------------------------------------------------------  Urinalysis    Component Value Date/Time   COLORURINE YELLOW 09/24/2018 1426   APPEARANCEUR CLEAR 09/24/2018 1426   LABSPEC 1.025 09/24/2018 1426   PHURINE 5.0 09/24/2018 1426   GLUCOSEU >=500 (A) 09/24/2018 1426   HGBUR SMALL (A) 09/24/2018 1426   BILIRUBINUR NEGATIVE 09/24/2018 1426   KETONESUR NEGATIVE 09/24/2018 1426   PROTEINUR NEGATIVE 09/24/2018 1426   UROBILINOGEN 0.2 09/08/2014 1444   NITRITE NEGATIVE 09/24/2018 1426   LEUKOCYTESUR NEGATIVE 09/24/2018 1426    ----------------------------------------------------------------------------------------------------------------   Imaging Results:    Dg Chest Port 1 View  Result Date: 05/04/2019 CLINICAL DATA:  Worsening shortness of breath EXAM: PORTABLE CHEST 1 VIEW COMPARISON:  September 17, 2018 FINDINGS: The heart size and mediastinal contours are within normal limits. There is pulmonary vascular congestion. Hazy airspace opacity seen at  the right lung base. Again noted is left apical pleural thickening. There is a emphysematous changes seen at the upper lungs. IMPRESSION: Pulmonary vascular congestion. Hazy airspace opacity at the right lung base which could be due to small layering effusion atelectasis and/or early infectious etiology. Electronically Signed   By: Ebony Cargo.D.  On: 05/04/2019 02:11    Radiological Exams on Admission: Dg Chest Port 1 View  Result Date: 05/04/2019 CLINICAL DATA:  Worsening shortness of breath EXAM: PORTABLE CHEST 1 VIEW COMPARISON:  September 17, 2018 FINDINGS: The heart size and mediastinal contours are within normal limits. There is pulmonary vascular congestion. Hazy airspace opacity seen at the right lung base. Again noted is left apical pleural thickening. There is a emphysematous changes seen at the upper lungs. IMPRESSION: Pulmonary vascular congestion. Hazy airspace opacity at the right lung base which could be due to small layering effusion atelectasis and/or early infectious etiology. Electronically Signed   By: Prudencio Pair M.D.   On: 05/04/2019 02:11    DVT Prophylaxis -SCD /heparin AM Labs Ordered, also please review Full Orders  Family Communication: Admission, patients condition and plan of care including tests being ordered have been discussed with the patient  who indicate understanding and agree with the plan   Code Status - Full Code  Likely DC to  Home   Condition   stable  Roxan Hockey M.D on 05/04/2019 at 6:44 PM Go to [email or web address] -  for contact info  [facility] - Office  [phone]

## 2019-05-04 NOTE — ED Provider Notes (Addendum)
[facility] EMERGENCY DEPARTMENT Provider Note   CSN: [id number] Arrival date & time: 05/04/19  0110   Time seen 1:30 AM  History   Chief Complaint Chief Complaint  Patient presents with  . Shortness of Breath    HPI Ryan [name] is a 58 y.o. male.     HPI when I asked patient when he started getting short of breath he states "a while".  He states he has been getting worsening shortness of breath over the past 3 to 4 months and it has gotten worse over the past 30 days.  He states tonight that he had to walk 25 feet to his bathroom and he just barely made it due to his breathing being bagged.  That is what prompted him to call EMS tonight.  Patient has been on oxygen at 6 L/min nasal cannula for at least the past 4 years.  EMS reports he was at 6 his 6 L and his pulse ox was 90%.  They put him on a nonrebreather mask and his pulse ox improved to 100%.  They also gave him Solu-Medrol 125 mg IV.  His CBG was 218.  Patient states he has not used his nebulizers tonight or recently because he felt like they are not helping.  He states he is having a cough with brown-yellow sputum without fever or chills.  He is having yellow-green rhinorrhea.  He denies sore throat.  He denies loss of taste or smell although he did have that a few weeks ago.  He has also been having swelling of his lower extremities and has been elevating his legs on 4 pillows.  He has never been tested for Covid.  He denies being around anybody else who is ill.  He states his son who lives with him was tested for Covid and was negative.  He states he quit smoking 3 years ago however his wife continues to smoke in the house.  PCP [name], MD   Past Medical History:  Diagnosis Date  . Atrial fibrillation ([place])    Not anticoagulated  . Barrett's esophagus   . Borderline diabetes   . Bronchitis   . CAP (community acquired pneumonia) 07/10/2013   04/2015  . Cavitary lesion of lung 05/07/2011   Cultures grew MAI,  tx antibiotics  . Chronic back pain   . Chronic diastolic CHF (congestive heart failure) ([place])   . Chronic left shoulder pain   . Chronic neck pain   . Chronic respiratory failure ([place])   . Collagen vascular disease ([place])   . COPD (chronic obstructive pulmonary disease) ([place])   . Coronary atherosclerosis of native coronary artery    Mild nonobstructive disease at catheterization 2007  . DDD (degenerative disc disease)    Cervical and thoracic  . Diastolic heart failure ([place])   . GERD (gastroesophageal reflux disease)   . History of pneumonia   . Hypercholesteremia   . On home O2    6L N/C  . Polycythemia   . Seizures ([place])    Last seizure 2 yrs ago  . Smoker   . Type 2 diabetes mellitus ([place])   . Type 2 diabetes mellitus without complication [facility])     Patient Active Problem List   Diagnosis Date Noted  . Dyspnea 05/04/2019  . Urinary retention 09/17/2018  . Chronic pain 09/17/2018  . Anxiety 09/17/2018  . Influenza due to identified novel influenza A virus with other respiratory manifestations 09/05/2018  . HCAP (healthcare-associated pneumonia) 08/26/2018  .  GERD (gastroesophageal reflux disease) 08/04/2018  . Chest pain 06/20/2018  . Left-sided chest pain 06/19/2018  . Dysphagia 04/26/2018  . Acute on chronic respiratory failure with hypoxia ([place]) 04/20/2018  . Vision disorder 12/30/2017  . Umbilical hernia 26/83/4196  . Chronic diastolic CHF (congestive heart failure) ([place]) 10/24/2017  . Lower extremity edema 10/17/2017  . MAI (mycobacterium avium-intracellulare) ([place]) 08/29/2015  . Anemia of chronic disease   . Elevated liver enzymes   . Absolute anemia   . Pulmonary fibrosis ([place]) 05/10/2015  . Chronic respiratory failure with hypoxia ([place]) 05/10/2015  . Pulmonary nodule   . Pain, generalized 05/01/2015  . Laceration 05/01/2015  . PAF (paroxysmal atrial fibrillation) ([place])   . COPD exacerbation ([place])   . Protein-calorie malnutrition, severe ([place]) 10/21/2013   . Hemoptysis 10/20/2013  . Atrial fibrillation with rapid ventricular response ([place]) 07/10/2013  . Rotator cuff syndrome of left shoulder 01/20/2012  . Spondylosis, cervical 11/19/2011  . 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 Cavitary lesion of lung 05/07/2011  . GERD with stricture   . ASCVD (arteriosclerotic cardiovascular disease)   . Type 2 diabetes mellitus without complication ([place])   . TOBACCO ABUSE 05/17/2010  . Asthma 01/20/2008  . Hyperlipidemia 12/27/2007  . 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 COLONOSCOPY  2012   Dr. Posey Pronto: normal  . ESOPHAGOGASTRODUODENOSCOPY N/A 05/04/2015   Dr. Michail Sermon: minimal erosive esophagitis, small hiatal hernia. FOOD PRECLUDED A COMPLETE EXAM. No biopsies taken  . ESOPHAGOGASTRODUODENOSCOPY  2011   [place]: Barrett's   . ESOPHAGOGASTRODUODENOSCOPY (EGD) WITH PROPOFOL N/A 05/06/2018   Dr. Gala Romney: erosive reflux esophagitis and +candida, Schatzki's ring s/p dilation, hiatal hernia.  [name] LUNG BIOPSY    . MALONEY DILATION N/A 05/06/2018   Procedure: Venia Minks DILATION;  Surgeon: Daneil Dolin, MD;  Location: [facility] ENDO SUITE;  Service: Endoscopy;  Laterality: N/A;  . Throat biopsy    . VASECTOMY    . VASECTOMY  1987        Home Medications    Prior to Admission medications   Medication Sig Start Date End Date Taking? Authorizing Provider  acetaminophen (TYLENOL) 325 MG tablet Take 2 tablets (650 mg total) by mouth every 6 (six) hours as needed for mild pain, moderate pain, fever or headache (or Fever >/= 101). 09/19/18   Roxan Hockey, MD  albuterol (VENTOLIN HFA) 108 (90 BASE) MCG/ACT inhaler Inhale 2 puffs into the lungs every 6 (six) hours as needed for wheezing or shortness of breath.    [provider]  alfuzosin (UROXATRAL) 10 MG 24 hr tablet Take 10 mg by mouth daily with breakfast.    [provider]  AMBULATORY NON  FORMULARY MEDICATION Take 100 mg by mouth daily. Medication Name: Clofazimine 01/25/19   Kuppelweiser, Cassie L, RPH-CPP  azithromycin (ZITHROMAX) 500 MG tablet 1 Tab daily per Dr Ivan Anchors medication 09/20/18   Roxan Hockey, MD  benzonatate (TESSALON) 200 MG capsule Take 200 mg by mouth 3 (three) times daily.    [provider]  budesonide (PULMICORT) 0.25 MG/2ML nebulizer solution Take 2 mLs (0.25 mg total) by nebulization 2 (two) times daily. 10/24/17   [name], MD  CARTIA XT 120 MG 24 hr capsule Take 1 capsule (120 mg total) by mouth daily. 01/01/18   Arnoldo Lenis, MD  diphenhydrAMINE (BENADRYL) 25 mg capsule Take 25 mg by mouth every 8 (eight) hours as needed.  Felt mouth swelling at ED visit 08/16/18.    [provider]  doxycycline (VIBRAMYCIN) 100 MG capsule TK 1 C PO BID 09/22/18   [provider]  fexofenadine (ALLEGRA) 180 MG tablet Take 180 mg by mouth daily.    [provider]  fluticasone (FLONASE) 50 MCG/ACT nasal spray Place 2 sprays into both nostrils daily as needed.  02/24/18   [provider]  Fluticasone-Umeclidin-Vilant (TRELEGY ELLIPTA) 100-62.5-25 MCG/INH AEPB Inhale 1 puff into the lungs daily.    [provider]  furosemide (LASIX) 40 MG tablet Take 1 tablet (40 mg total) by mouth daily. As needed for swelling 09/10/18 09/10/19  [name], MD  insulin glargine (LANTUS) 100 UNIT/ML injection Inject 0.4 mLs (40 Units total) into the skin daily. 24 units daily 09/19/18   Roxan Hockey, MD  insulin lispro (HUMALOG) 100 UNIT/ML injection Take by sliding scale provided by hospital maximum amount daily is 60 units.  Check blood sugar 4 times a day Patient taking differently: Inject 0-15 Units into the skin 3 (three) times daily with meals. Take by sliding scale provided by hospital maximum amount daily is 60 units.  Check blood sugar 4 times a day 09/23/17   [name], MD  ipratropium (ATROVENT) 0.02 %  nebulizer solution Take 0.5 mg by nebulization 2 (two) times daily.  09/29/17   [provider]  levalbuterol Penne Lash) 0.63 MG/3ML nebulizer solution Take 0.63 mg by nebulization 2 (two) times daily.    [provider]  LORazepam (ATIVAN) 0.5 MG tablet Take 1 tablet (0.5 mg total) by mouth 2 (two) times daily as needed for anxiety or sleep. Patient taking differently: Take 0.5 mg by mouth 4 (four) times daily. Pt takes it three times daily. 05/04/15   Mikhail, Velta Addison, DO  magnesium 30 MG tablet Take 30 mg by mouth daily.    [provider]  metFORMIN (GLUCOPHAGE) 500 MG tablet Take 1 tablet (500 mg total) by mouth daily with breakfast. 09/15/14   Rexene Alberts, MD  nitroGLYCERIN (NITROSTAT) 0.4 MG SL tablet Place 1 tablet (0.4 mg total) under the tongue every 5 (five) minutes as needed for chest pain. 04/22/13   Lendon Colonel, NP  omeprazole (PRILOSEC) 20 MG capsule Take 1 capsule (20 mg total) by mouth 2 (two) times daily before a meal. 08/04/18   Annitta Needs, NP  oxyCODONE-acetaminophen (PERCOCET) 10-325 MG tablet Take 1 tablet by mouth every 4 (four) hours as needed for pain.    [provider]  potassium chloride SA (K-DUR,KLOR-CON) 20 MEQ tablet Take 1 tablet twice daily while on Lasix 09/10/18   [name], MD  predniSONE (DELTASONE) 10 MG tablet Take 2 daily for 1 week then 1 daily Patient taking differently: Take 20 mg by mouth daily with breakfast. Takes 2 tablets daily. 10/24/17   [name], MD  rifampin (RIFADIN) 300 MG capsule Take 2 capsules (600 mg total) by mouth daily. Long-term medication 09/19/18   Roxan Hockey, MD  rosuvastatin (CRESTOR) 10 MG tablet Take 10 mg by mouth every morning.  04/10/18   [provider]  SYMBICORT 160-4.5 MCG/ACT inhaler INL 2 PFS PO BID 09/23/18   [provider]  tamsulosin (FLOMAX) 0.4 MG CAPS capsule Take 1 capsule (0.4 mg total) by mouth 2 (two) times daily. 09/19/18   Roxan Hockey, MD    Family History Family History  Problem Relation Age of Onset  . Hypertension Mother   . Diabetes Mother   . Heart  attack Mother   . Heart failure Mother   . Hypertension Sister   . Diabetes Sister   . Heart failure Sister   . Colon cancer Neg Hx     Social History Social History   Tobacco Use  . Smoking status: Former Smoker    Packs/day: 0.50    Years: 45.00    Pack years: 22.50    Types: Cigarettes    Quit date: 05/01/2015    Years since quitting: 4.0  . Smokeless tobacco: Never Used  Substance Use Topics  . Alcohol use: No    Alcohol/week: 0.0 standard drinks  . Drug use: No  lives with spouse On home oxygen 6 lpm [place]   Allergies   Albuterol, Ciprofloxacin, and Influenza vaccine live   Review of Systems Review of Systems  All other systems reviewed and are negative.    Physical Exam Updated Vital Signs BP 112/74   Pulse (!) 102   Temp 98 F (36.7 C) (Oral)   Resp 20   Ht 5\' 8"  (1.727 m)   Wt 81.6 kg   SpO2 99%   BMI 27.37 kg/m   Physical Exam Vitals signs and nursing note reviewed.  Constitutional:      Appearance: Normal appearance. He is obese.  HENT:     Head: Normocephalic and atraumatic.     Nose: Nose normal.  Eyes:     Extraocular Movements: Extraocular movements intact.     Conjunctiva/sclera: Conjunctivae normal.     Pupils: Pupils are equal, round, and reactive to light.  Neck:     Musculoskeletal: Normal range of motion.  Cardiovascular:     Rate and Rhythm: Regular rhythm. Tachycardia present.  Pulmonary:     Effort: Tachypnea, accessory muscle usage, prolonged expiration and respiratory distress present.     Breath sounds: Decreased air movement present.     Comments: Patient has audible rales Abdominal:     General: There is distension.     Tenderness: There is abdominal tenderness.  Musculoskeletal:     Right lower leg: Edema present.     Left lower leg: Edema present.     Comments: Patient has 1-2+  pitting edema of his right lower extremity below the knee and the foot, he has 1/2-1+ edema on the left.  Skin:    General: Skin is warm and dry.  Neurological:     General: No focal deficit present.     Mental Status: He is alert and oriented to person, place, and time.     Cranial Nerves: No cranial nerve deficit.  Psychiatric:        Mood and Affect: Mood normal.        Behavior: Behavior normal.        Thought Content: Thought content normal.      ED Treatments / Results  Labs (all labs ordered are listed, but only abnormal results are displayed)  Results for orders placed or performed during the hospital encounter of 05/04/19  Comprehensive metabolic panel  Result Value Ref Range   Sodium 137 135 - 145 mmol/L   Potassium 3.7 3.5 - 5.1 mmol/L   Chloride 99 98 - 111 mmol/L   CO2 30 22 - 32 mmol/L   Glucose, Bld 210 (H) 70 - 99 mg/dL   BUN 12 6 - 20 mg/dL   Creatinine, Ser 1.50 (H) 0.61 - 1.24 mg/dL   Calcium 8.3 (L) 8.9 - 10.3 mg/dL   Total Protein 7.0 6.5 - 8.1 g/dL  Albumin 2.9 (L) 3.5 - 5.0 g/dL   AST 28 15 - 41 U/L   ALT 55 (H) 0 - 44 U/L   Alkaline Phosphatase 65 38 - 126 U/L   Total Bilirubin 0.4 0.3 - 1.2 mg/dL   GFR calc non Af Amer 50 (L) >60 mL/min   GFR calc Af Amer 58 (L) >60 mL/min   Anion gap 8 5 - 15  Brain natriuretic peptide  Result Value Ref Range   B Natriuretic Peptide 40.0 0.0 - 100.0 pg/mL  CBC with Differential  Result Value Ref Range   WBC 6.9 4.0 - 10.5 K/uL   RBC 4.30 4.22 - 5.81 MIL/uL   Hemoglobin 11.9 (L) 13.0 - 17.0 g/dL   HCT 38.3 (L) 39.0 - 52.0 %   MCV 89.1 80.0 - 100.0 fL   MCH 27.7 26.0 - 34.0 pg   MCHC 31.1 30.0 - 36.0 g/dL   RDW 14.6 11.5 - 15.5 %   Platelets 263 150 - 400 K/uL   nRBC 0.0 0.0 - 0.2 %   Neutrophils Relative % 65 %   Neutro Abs 4.6 1.7 - 7.7 K/uL   Lymphocytes Relative 18 %   Lymphs Abs 1.2 0.7 - 4.0 K/uL   Monocytes Relative 8 %   Monocytes Absolute 0.5 0.1 - 1.0 K/uL   Eosinophils Relative 7 %    Eosinophils Absolute 0.5 0.0 - 0.5 K/uL   Basophils Relative 1 %   Basophils Absolute 0.0 0.0 - 0.1 K/uL   Immature Granulocytes 1 %   Abs Immature Granulocytes 0.05 0.00 - 0.07 K/uL  Troponin I (High Sensitivity)  Result Value Ref Range   Troponin I (High Sensitivity) 2 <18 ng/L  Troponin I (High Sensitivity)  Result Value Ref Range   Troponin I (High Sensitivity) 2 <18 ng/L   Laboratory interpretation all normal except renal insufficiency, stable mild anemia negative delta troponin   EKG EKG Interpretation  Date/Time:  Wednesday May 04 2019 01:29:13 EDT Ventricular Rate:  110 PR Interval:    QRS Duration: 86 QT Interval:  341 QTC Calculation: 462 R Axis:   50 Text Interpretation:  Sinus tachycardia Low voltage, extremity and precordial leads Electrode noise No significant change since last tracing 24 Sep 2018 Confirmed by Rolland Porter [phone]) on 05/04/2019 1:36:22 AM   Radiology Dg Chest Port 1 View  Result Date: 05/04/2019 CLINICAL DATA:  Worsening shortness of breath EXAM: PORTABLE CHEST 1 VIEW COMPARISON:  September 17, 2018 FINDINGS: The heart size and mediastinal contours are within normal limits. There is pulmonary vascular congestion. Hazy airspace opacity seen at the right lung base. Again noted is left apical pleural thickening. There is a emphysematous changes seen at the upper lungs. IMPRESSION: Pulmonary vascular congestion. Hazy airspace opacity at the right lung base which could be due to small layering effusion atelectasis and/or early infectious etiology. Electronically Signed   By: Prudencio Pair M.D.   On: 05/04/2019 02:11    Procedures .Critical Care Performed by: Rolland Porter, MD Authorized by: Rolland Porter, MD   Critical care provider statement:    Critical care time (minutes):  34   Critical care was necessary to treat or prevent imminent or life-threatening deterioration of the following conditions:  Respiratory failure   Critical care was time spent  personally by me on the following activities:  Discussions with consultants, examination of patient, obtaining history from patient or surrogate, ordering and review of laboratory studies, ordering and review of radiographic studies, pulse oximetry  and re-evaluation of patient's condition   (including critical care time)  Medications Ordered in ED Medications  azithromycin (ZITHROMAX) 500 mg in sodium chloride 0.9 % 250 mL IVPB (500 mg Intravenous New Bag/Given 05/04/19 0637)  furosemide (LASIX) injection 80 mg (80 mg Intravenous Given 05/04/19 0144)  albuterol (VENTOLIN HFA) 108 (90 Base) MCG/ACT inhaler 4 puff (4 puffs Inhalation Given 05/04/19 0152)  albuterol (VENTOLIN HFA) 108 (90 Base) MCG/ACT inhaler 8 puff (8 puffs Inhalation Given 05/04/19 0458)  cefTRIAXone (ROCEPHIN) 1 g in sodium chloride 0.9 % 100 mL IVPB (0 g Intravenous Stopped 05/04/19 8242)     Initial Impression / Assessment and Plan / ED Course  I have reviewed the triage vital signs and the nursing notes.  Pertinent labs & imaging results that were available during my care of the patient were reviewed by me and considered in my medical decision making (see chart for details).        Patient was given Lasix 80 mg IV.  He was given albuterol 4 puffs, he had a warning that it causes tachycardia.  Recheck at 4:55 AM he states he is feeling better.  He has had almost 400 cc urinary output.  When I listen to him now he has much better air movement and he still has some rhonchi and rare end expiratory wheezes.  His Covid test is still pending, he was given albuterol 8 puffs inhaler.  Recheck at 5:50 AM patient is having diffuse and sometimes audible rales again.  I feel like he is going to need to be admitted.  His Covid test is still pending.  Patient's exam is consistent with congestive heart failure however his BNP is normal.  Patient had some mild diuresis after Lasix.  He still has lots of rales after the albuterol  inhalations.  6:01AM Dr. Darrick Meigs, hospitalist will admit.  His Covid test is still pending.  Ryan [name] was evaluated in Emergency Department on 05/04/2019 for the symptoms described in the history of present illness. He was evaluated in the context of the global COVID-19 pandemic, which necessitated consideration that the patient might be at risk for infection with the SARS-CoV-2 virus that causes COVID-19. Institutional protocols and algorithms that pertain to the evaluation of patients at risk for COVID-19 are in a state of rapid change based on information released by regulatory bodies including the CDC and federal and state organizations. These policies and algorithms were followed during the patient's care in the ED.   Final Clinical Impressions(s) / ED Diagnoses   Final diagnoses:  Shortness of breath  Extremity edema  Chest rales    Plan admission  Rolland Porter, MD, Barbette Or, MD 05/04/19 [place], [place], MD 05/04/19 [phone]

## 2019-05-04 NOTE — ED Notes (Signed)
Pt is going to take his pain medication from home. Dr. Tomi Bamberger aware.

## 2019-05-04 NOTE — ED Notes (Signed)
Pt had large dark brown stool, pt reports his stool is usually not that dark.  Hospitalist notified.

## 2019-05-04 NOTE — ED Triage Notes (Signed)
Pt brought in by [facility] for c/o sob that has gotten worse over the last couple of days; it has been going on for 3 months; pt states he has been coughing up yellow/brown sputum; pt is on O2 6L via [place] all the time and tonight when ems arrived pt was at 90%; they applied a NRB at 15 L of O2 and pt increased to 100%; pt denies any pain; cbg 218; pt was given 125mg  of solumedrol en route by ems

## 2019-05-04 NOTE — ED Notes (Signed)
Patient requested BGL check before he ate breakfast.

## 2019-05-05 ENCOUNTER — Inpatient Hospital Stay (HOSPITAL_COMMUNITY): Payer: Medicare Other

## 2019-05-05 DIAGNOSIS — I4891 Unspecified atrial fibrillation: Secondary | ICD-10-CM

## 2019-05-05 LAB — ECHOCARDIOGRAM COMPLETE
Height: 68 in
Weight: 2941.82 oz

## 2019-05-05 LAB — GLUCOSE, CAPILLARY
Glucose-Capillary: 287 mg/dL — ABNORMAL HIGH (ref 70–99)
Glucose-Capillary: 391 mg/dL — ABNORMAL HIGH (ref 70–99)
Glucose-Capillary: 158 mg/dL — ABNORMAL HIGH (ref 70–99)
Glucose-Capillary: 262 mg/dL — ABNORMAL HIGH (ref 70–99)

## 2019-05-05 LAB — CBC
HCT: 37 % — ABNORMAL LOW (ref 39.0–52.0)
Hemoglobin: 11.5 g/dL — ABNORMAL LOW (ref 13.0–17.0)
MCH: 27.8 pg (ref 26.0–34.0)
MCHC: 31.1 g/dL (ref 30.0–36.0)
MCV: 89.6 fL (ref 80.0–100.0)
Platelets: 279 10*3/uL (ref 150–400)
RBC: 4.13 MIL/uL — ABNORMAL LOW (ref 4.22–5.81)
RDW: 15.1 % (ref 11.5–15.5)
WBC: 7.6 10*3/uL (ref 4.0–10.5)
nRBC: 0.3 % — ABNORMAL HIGH (ref 0.0–0.2)

## 2019-05-05 LAB — BASIC METABOLIC PANEL
Anion gap: 12 (ref 5–15)
BUN: 14 mg/dL (ref 6–20)
CO2: 28 mmol/L (ref 22–32)
Calcium: 8.6 mg/dL — ABNORMAL LOW (ref 8.9–10.3)
Chloride: 98 mmol/L (ref 98–111)
Creatinine, Ser: 1.17 mg/dL (ref 0.61–1.24)
GFR calc Af Amer: >60 mL/min (ref >60)
GFR calc non Af Amer: >60 mL/min (ref >60)
Glucose, Bld: 265 mg/dL — ABNORMAL HIGH (ref 70–99)
Potassium: 3.5 mmol/L (ref 3.5–5.1)
Sodium: 138 mmol/L (ref 135–145)

## 2019-05-05 LAB — PROTIME-INR
INR: 1.1 (ref 0.8–1.2)
Prothrombin Time: 13.7 seconds (ref 11.4–15.2)

## 2019-05-05 LAB — GLUCOSE, RANDOM: Glucose, Bld: 374 mg/dL — ABNORMAL HIGH (ref 70–99)

## 2019-05-05 MED ORDER — LEVALBUTEROL HCL 0.63 MG/3ML IN NEBU
0.6300 mg | INHALATION_SOLUTION | Freq: Four times a day (QID) | RESPIRATORY_TRACT | Status: DC | PRN
  Filled 2019-05-05 (×3): qty 3

## 2019-05-05 MED ORDER — INSULIN ASPART 100 UNIT/ML ~~LOC~~ SOLN
6.0000 [IU] | Freq: Three times a day (TID) | SUBCUTANEOUS | Status: DC
  Administered 2019-05-05 – 2019-05-09 (×12): 6 [IU] via SUBCUTANEOUS

## 2019-05-05 MED ORDER — LEVALBUTEROL HCL 1.25 MG/0.5ML IN NEBU: 1.2500 mg | INHALATION_SOLUTION | Freq: Four times a day (QID) | RESPIRATORY_TRACT | Status: DC

## 2019-05-05 MED ORDER — MAGNESIUM SULFATE 2 GM/50ML IV SOLN
2.0000 g | Freq: Once | INTRAVENOUS | Status: AC
  Administered 2019-05-05: 2 g via INTRAVENOUS
  Filled 2019-05-05: qty 50

## 2019-05-05 MED ORDER — HYDROCODONE-HOMATROPINE 5-1.5 MG/5ML PO SYRP
5.0000 mL | ORAL_SOLUTION | ORAL | Status: DC | PRN
  Administered 2019-05-05 – 2019-05-25 (×99): 5 mL via ORAL
  Filled 2019-05-05 (×100): qty 5

## 2019-05-05 MED ORDER — INSULIN GLARGINE 100 UNIT/ML ~~LOC~~ SOLN
50.0000 [IU] | Freq: Every day | SUBCUTANEOUS | Status: DC
  Filled 2019-05-05 (×4): qty 0.5

## 2019-05-05 MED ORDER — INSULIN GLARGINE 100 UNIT/ML ~~LOC~~ SOLN
10.0000 [IU] | Freq: Once | SUBCUTANEOUS | Status: AC
  Administered 2019-05-05: 10 [IU] via SUBCUTANEOUS
  Filled 2019-05-05: qty 0.1

## 2019-05-05 MED ORDER — KETOROLAC TROMETHAMINE 30 MG/ML IJ SOLN
30.0000 mg | Freq: Once | INTRAMUSCULAR | Status: AC
  Administered 2019-05-05: 30 mg via INTRAVENOUS
  Filled 2019-05-05: qty 1

## 2019-05-05 MED ORDER — LEVALBUTEROL HCL 1.25 MG/0.5ML IN NEBU
1.2500 mg | INHALATION_SOLUTION | Freq: Four times a day (QID) | RESPIRATORY_TRACT | Status: DC
  Administered 2019-05-05: 10:00:00 1.25 mg via RESPIRATORY_TRACT
  Filled 2019-05-05: qty 0.5

## 2019-05-05 MED ORDER — LEVALBUTEROL HCL 1.25 MG/0.5ML IN NEBU
1.2500 mg | INHALATION_SOLUTION | Freq: Four times a day (QID) | RESPIRATORY_TRACT | Status: DC
  Administered 2019-05-05 – 2019-05-29 (×97): 1.25 mg via RESPIRATORY_TRACT
  Filled 2019-05-05 (×95): qty 0.5

## 2019-05-05 MED ORDER — BUDESONIDE 0.25 MG/2ML IN SUSP
0.2500 mg | Freq: Two times a day (BID) | RESPIRATORY_TRACT | Status: DC
  Administered 2019-05-06 – 2019-05-29 (×47): 0.25 mg via RESPIRATORY_TRACT
  Filled 2019-05-05 (×47): qty 2

## 2019-05-05 MED ORDER — PANTOPRAZOLE SODIUM 40 MG IV SOLR
40.0000 mg | Freq: Two times a day (BID) | INTRAVENOUS | Status: DC
  Administered 2019-05-05 – 2019-05-09 (×9): 40 mg via INTRAVENOUS
  Filled 2019-05-05 (×9): qty 40

## 2019-05-05 MED ORDER — IPRATROPIUM BROMIDE 0.02 % IN SOLN
0.5000 mg | Freq: Two times a day (BID) | RESPIRATORY_TRACT | Status: DC
  Administered 2019-05-06 – 2019-05-29 (×47): 0.5 mg via RESPIRATORY_TRACT
  Filled 2019-05-05 (×48): qty 2.5

## 2019-05-05 MED ORDER — METOPROLOL TARTRATE 5 MG/5ML IV SOLN
5.0000 mg | Freq: Once | INTRAVENOUS | Status: AC
  Administered 2019-05-05: 05:00:00 5 mg via INTRAVENOUS

## 2019-05-05 MED ORDER — METOPROLOL TARTRATE 5 MG/5ML IV SOLN
INTRAVENOUS | Status: AC
  Administered 2019-05-05: 5 mg via INTRAVENOUS
  Filled 2019-05-05: qty 5

## 2019-05-05 MED ORDER — SODIUM CHLORIDE 0.9 % IV BOLUS: 1000.0000 mL | Freq: Once | INTRAVENOUS | Status: DC

## 2019-05-05 MED ORDER — POTASSIUM CHLORIDE CRYS ER 20 MEQ PO TBCR
40.0000 meq | EXTENDED_RELEASE_TABLET | Freq: Once | ORAL | Status: AC
  Administered 2019-05-05: 40 meq via ORAL
  Filled 2019-05-05: qty 2

## 2019-05-05 NOTE — Progress Notes (Signed)
*  PRELIMINARY RESULTS* Echocardiogram 2D Echocardiogram has been performed.  Leavy Cella 05/05/2019, 3:54 PM

## 2019-05-05 NOTE — Progress Notes (Signed)
[facility] night coverage note.  The patient received an albuterol neb treatment earlier.  He has a history of palpitations and paroxysmal atrial fibrillation related to albuterol use.  His heart rate was in the 150s to 180s.  He denied having chest pain, dizziness, but stated he had some palpitations.  He was given metoprolol 5 mg IVP, magnesium sulfate 2 g IVPB and K-Dur 40 mEq p.o. x1 dose.  An echocardiogram was ordered.  No anticoagulation was given due to guaiac positive stools.  Protonix was switched to 40 mg IVP every 12 hours.  The patient also had bilateral lower extremity edema, but particularly more pronounced on the right lower extremity.  Right lower extremity duplex was ordered.  Tennis Must, MD.

## 2019-05-05 NOTE — Progress Notes (Addendum)
Inpatient Diabetes Program Recommendations  AACE/ADA: New Consensus Statement on Inpatient Glycemic Control (2015)  Target Ranges:  Prepandial:   less than 140 mg/dL      Peak postprandial:   less than 180 mg/dL (1-2 hours)      Critically ill patients:  140 - 180 mg/dL   Lab Results  Component Value Date   GLUCAP 287 (H) 05/05/2019   HGBA1C 9.3 (H) 05/04/2019    Review of Glycemic Control Results for Ryan [name], Ryan [name] (MRN [id number]) as of 05/05/2019 10:35  Ref. Range 05/04/2019 12:21 05/04/2019 17:45 05/04/2019 20:29 05/04/2019 22:54 05/05/2019 07:32  Glucose-Capillary Latest Ref Range: 70 - 99 mg/dL 283 (H) 313 (H) 282 (H) 403 (H) 287 (H)   Diabetes history: DM 2 Outpatient Diabetes medications: Lantus 40 units, Humalog 0-15 units tid, Metformin 500 mg Daily  Current orders for Inpatient glycemic control:  Lantus 40 units, Novolog 0-9 units tid + hs, Metformin 500 mg Daily  Solumedrol 40 mg Q12 started.  Inpatient Diabetes Program Recommendations:    Fasting elevated in addition to glucose trends increase into the 400's after meals.  While on solumedrol consider:  -  Increase Lantus to 50 units to start tomorrow (patient to get extra 10 units this am) -  Add Novolog 6 units tid meal coverage if pt eats >50% of meals.  Called Dr. Luan Pulling about patient and recs. Notified RN with update in addition to pharmacy.  Thanks,  Tama Headings RN, MSN, BC-ADM Inpatient Diabetes Coordinator Team Pager [phone] (8a-5p)

## 2019-05-05 NOTE — Progress Notes (Signed)
  Dr. [name] assumed care this am -  [facility] will not round on patient   Transferred to [place] (Dr. Luan Pulling Team)  Dr. Luan Pulling confirmed that this is his Patient  Roxan Hockey, MD

## 2019-05-05 NOTE — Progress Notes (Signed)
Subjective: He was admitted yesterday with congestive heart failure COPD exacerbation acute on chronic hypoxic respiratory failure and pneumonia.  He developed A. fib with RVR last night after getting an albuterol nebulizer treatment.  He is complaining of pain in his right side.  He is coughing productively and bringing up a lot of sputum.  He is still short of breath.  He had heme positive stool in the emergency department  Objective: Vital signs in last 24 hours: Temp:  [97.6 F (36.4 C)-98.2 F (36.8 C)] 97.6 F (36.4 C) (10/22 0419) Pulse Rate:  [92-142] 110 (10/22 0459) Resp:  [17-23] 20 (10/22 0459) BP: (109-139)/(65-101) 116/101 (10/22 0459) SpO2:  [93 %-100 %] 96 % (10/22 0459) Weight:  [81.1 kg-83.4 kg] 83.4 kg (10/22 0500) Weight change: -0.499 kg Last BM Date: 05/04/19  Intake/Output from previous day: 10/21 0701 - 10/22 0700 In: 757.8 [P.O.:480; IV Piggyback:277.8] Out: 1540 [Urine:1540]  PHYSICAL EXAM General appearance: alert, cooperative and mild distress Resp: rhonchi bilaterally Cardio: Heart sounds are largely obscured by airway noises GI: soft, non-tender; bowel sounds normal; no masses,  no organomegaly Extremities: He has swelling of both legs right more than left  Lab Results:  Results for orders placed or performed during the hospital encounter of 05/04/19 (from the past 48 hour(s))  SARS CORONAVIRUS 2 (TAT 6-24 HRS) Nasopharyngeal Nasopharyngeal Swab     Status: None   Collection Time: 05/04/19  1:43 AM   Specimen: Nasopharyngeal Swab  Result Value Ref Range   SARS Coronavirus 2 NEGATIVE NEGATIVE    Comment: (NOTE) SARS-CoV-2 target nucleic acids are NOT DETECTED. The SARS-CoV-2 RNA is generally detectable in upper and lower respiratory specimens during the acute phase of infection. Negative results do not preclude SARS-CoV-2 infection, do not rule out co-infections with other pathogens, and should not be used as the sole basis for treatment or  other patient management decisions. Negative results must be combined with clinical observations, patient history, and epidemiological information. The expected result is Negative. Fact Sheet for Patients: SugarRoll.be Fact Sheet for Healthcare Providers: https://www.woods-mathews.com/ This test is not yet approved or cleared by the [place] FDA and  has been authorized for detection and/or diagnosis of SARS-CoV-2 by FDA under an Emergency Use Authorization (EUA). This EUA will remain  in effect (meaning this test can be used) for the duration of the COVID-19 declaration under Section 56 4(b)(1) of the Act, 21 U.S.C. section 360bbb-3(b)(1), unless the authorization is terminated or revoked sooner. Performed at [facility], [place] [address], [place], [place] [address]   Comprehensive metabolic panel     Status: Abnormal   Collection Time: 05/04/19  2:27 AM  Result Value Ref Range   Sodium 137 135 - 145 mmol/L   Potassium 3.7 3.5 - 5.1 mmol/L   Chloride 99 98 - 111 mmol/L   CO2 30 22 - 32 mmol/L   Glucose, Bld 210 (H) 70 - 99 mg/dL   BUN 12 6 - 20 mg/dL   Creatinine, Ser 1.50 (H) 0.61 - 1.24 mg/dL   Calcium 8.3 (L) 8.9 - 10.3 mg/dL   Total Protein 7.0 6.5 - 8.1 g/dL   Albumin 2.9 (L) 3.5 - 5.0 g/dL   AST 28 15 - 41 U/L   ALT 55 (H) 0 - 44 U/L   Alkaline Phosphatase 65 38 - 126 U/L   Total Bilirubin 0.4 0.3 - 1.2 mg/dL   GFR calc non Af Amer 50 (L) >60 mL/min   GFR calc  Af Amer 58 (L) >60 mL/min   Anion gap 8 5 - 15    Comment: Performed at [facility], [address], [place], [place] [address]  Brain natriuretic peptide     Status: None   Collection Time: 05/04/19  2:27 AM  Result Value Ref Range   B Natriuretic Peptide 40.0 0.0 - 100.0 pg/mL    Comment: Performed at [facility], [address], [place], [place] [address]  Troponin I (High Sensitivity)     Status: None   Collection Time: 05/04/19  2:27 AM  Result Value  Ref Range   Troponin I (High Sensitivity) 2 <18 ng/L    Comment: (NOTE) Elevated high sensitivity troponin I (hsTnI) values and significant  changes across serial measurements may suggest ACS but many other  chronic and acute conditions are known to elevate hsTnI results.  Refer to the "Links" section for chest pain algorithms and additional  guidance. Performed at [facility], [address] [place], [place] [address]   CBC with Differential     Status: Abnormal   Collection Time: 05/04/19  2:27 AM  Result Value Ref Range   WBC 6.9 4.0 - 10.5 K/uL   RBC 4.30 4.22 - 5.81 MIL/uL   Hemoglobin 11.9 (L) 13.0 - 17.0 g/dL   HCT 38.3 (L) 39.0 - 52.0 %   MCV 89.1 80.0 - 100.0 fL   MCH 27.7 26.0 - 34.0 pg   MCHC 31.1 30.0 - 36.0 g/dL   RDW 14.6 11.5 - 15.5 %   Platelets 263 150 - 400 K/uL   nRBC 0.0 0.0 - 0.2 %   Neutrophils Relative % 65 %   Neutro Abs 4.6 1.7 - 7.7 K/uL   Lymphocytes Relative 18 %   Lymphs Abs 1.2 0.7 - 4.0 K/uL   Monocytes Relative 8 %   Monocytes Absolute 0.5 0.1 - 1.0 K/uL   Eosinophils Relative 7 %   Eosinophils Absolute 0.5 0.0 - 0.5 K/uL   Basophils Relative 1 %   Basophils Absolute 0.0 0.0 - 0.1 K/uL   Immature Granulocytes 1 %   Abs Immature Granulocytes 0.05 0.00 - 0.07 K/uL    Comment: Performed at [facility], [address], [place], [place] [address]  Hemoglobin A1c     Status: Abnormal   Collection Time: 05/04/19  2:27 AM  Result Value Ref Range   Hgb A1c MFr Bld 9.3 (H) 4.8 - 5.6 %    Comment: (NOTE) Pre diabetes:          5.7%-6.4% Diabetes:              >6.4% Glycemic control for   <7.0% adults with diabetes    Mean Plasma Glucose 220.21 mg/dL    Comment: Performed at [facility], [place] [address], [place], [place] [address]  Troponin I (High Sensitivity)     Status: None   Collection Time: 05/04/19  4:00 AM  Result Value Ref Range   Troponin I (High Sensitivity) 2 <18 ng/L    Comment: (NOTE) Elevated high sensitivity troponin I  (hsTnI) values and significant  changes across serial measurements may suggest ACS but many other  chronic and acute conditions are known to elevate hsTnI results.  Refer to the "Links" section for chest pain algorithms and additional  guidance. Performed at [facility], [address], [place], [place] [address]   CBG monitoring, ED     Status: Abnormal   Collection Time: 05/04/19  9:14 AM  Result Value Ref Range   Glucose-Capillary 279 (H) 70 - 99 mg/dL  POC occult blood, ED     Status: Abnormal   Collection Time: 05/04/19 10:29 AM  Result Value Ref Range   Fecal Occult Bld POSITIVE (A) NEGATIVE  CBG monitoring, ED     Status: Abnormal   Collection Time: 05/04/19 12:21 PM  Result Value Ref Range   Glucose-Capillary 283 (H) 70 - 99 mg/dL  CBG monitoring, ED     Status: Abnormal   Collection Time: 05/04/19  5:45 PM  Result Value Ref Range   Glucose-Capillary 313 (H) 70 - 99 mg/dL  Glucose, capillary     Status: Abnormal   Collection Time: 05/04/19  8:29 PM  Result Value Ref Range   Glucose-Capillary 282 (H) 70 - 99 mg/dL  Glucose, capillary     Status: Abnormal   Collection Time: 05/04/19 10:54 PM  Result Value Ref Range   Glucose-Capillary 403 (H) 70 - 99 mg/dL   Comment 1 Notify RN    Comment 2 Document in Chart   Glucose, random     Status: Abnormal   Collection Time: 05/04/19 11:49 PM  Result Value Ref Range   Glucose, Bld 374 (H) 70 - 99 mg/dL    Comment: Performed at [facility], [address] [place], [place] [address]  Basic metabolic panel     Status: Abnormal   Collection Time: 05/05/19  6:27 AM  Result Value Ref Range   Sodium 138 135 - 145 mmol/L   Potassium 3.5 3.5 - 5.1 mmol/L   Chloride 98 98 - 111 mmol/L   CO2 28 22 - 32 mmol/L   Glucose, Bld 265 (H) 70 - 99 mg/dL   BUN 14 6 - 20 mg/dL   Creatinine, Ser 1.17 0.61 - 1.24 mg/dL   Calcium 8.6 (L) 8.9 - 10.3 mg/dL   GFR calc non Af Amer >60 >60 mL/min   GFR calc Af Amer >60 >60 mL/min   Anion gap 12 5  - 15    Comment: Performed at [facility] [address], [place], [place] [address]  CBC     Status: Abnormal   Collection Time: 05/05/19  6:27 AM  Result Value Ref Range   WBC 7.6 4.0 - 10.5 K/uL   RBC 4.13 (L) 4.22 - 5.81 MIL/uL   Hemoglobin 11.5 (L) 13.0 - 17.0 g/dL   HCT 37.0 (L) 39.0 - 52.0 %   MCV 89.6 80.0 - 100.0 fL   MCH 27.8 26.0 - 34.0 pg   MCHC 31.1 30.0 - 36.0 g/dL   RDW 15.1 11.5 - 15.5 %   Platelets 279 150 - 400 K/uL   nRBC 0.3 (H) 0.0 - 0.2 %    Comment: Performed at [facility], [address], [place], [place] [address]  Protime-INR     Status: None   Collection Time: 05/05/19  6:27 AM  Result Value Ref Range   Prothrombin Time 13.7 11.4 - 15.2 seconds   INR 1.1 0.8 - 1.2    Comment: (NOTE) INR goal varies based on device and disease states. Performed at [facility], [address] [place], [place] [address]   Glucose, capillary     Status: Abnormal   Collection Time: 05/05/19  7:32 AM  Result Value Ref Range   Glucose-Capillary 287 (H) 70 - 99 mg/dL    ABGS No results for input(s): PHART, PO2ART, TCO2, HCO3 in the last 72 hours.  Invalid input(s): PCO2 CULTURES Recent  Results (from the past 240 hour(s))  SARS CORONAVIRUS 2 (TAT 6-24 HRS) Nasopharyngeal Nasopharyngeal Swab     Status: None   Collection Time: 05/04/19  1:43 AM   Specimen: Nasopharyngeal Swab  Result Value Ref Range Status   SARS Coronavirus 2 NEGATIVE NEGATIVE Final    Comment: (NOTE) SARS-CoV-2 target nucleic acids are NOT DETECTED. The SARS-CoV-2 RNA is generally detectable in upper and lower respiratory specimens during the acute phase of infection. Negative results do not preclude SARS-CoV-2 infection, do not rule out co-infections with other pathogens, and should not be used as the sole basis for treatment or other patient management decisions. Negative results must be combined with clinical observations, patient history, and epidemiological information. The expected result  is Negative. Fact Sheet for Patients: SugarRoll.be Fact Sheet for Healthcare Providers: https://www.woods-mathews.com/ This test is not yet approved or cleared by the [place] FDA and  has been authorized for detection and/or diagnosis of SARS-CoV-2 by FDA under an Emergency Use Authorization (EUA). This EUA will remain  in effect (meaning this test can be used) for the duration of the COVID-19 declaration under Section 56 4(b)(1) of the Act, 21 U.S.C. section 360bbb-3(b)(1), unless the authorization is terminated or revoked sooner. Performed at [facility], [place] [address], [place], [place] [address]    Studies/Results: Dg Chest Port 1 View  Result Date: 05/04/2019 CLINICAL DATA:  Worsening shortness of breath EXAM: PORTABLE CHEST 1 VIEW COMPARISON:  September 17, 2018 FINDINGS: The heart size and mediastinal contours are within normal limits. There is pulmonary vascular congestion. Hazy airspace opacity seen at the right lung base. Again noted is left apical pleural thickening. There is a emphysematous changes seen at the upper lungs. IMPRESSION: Pulmonary vascular congestion. Hazy airspace opacity at the right lung base which could be due to small layering effusion atelectasis and/or early infectious etiology. Electronically Signed   By: Prudencio Pair M.D.   On: 05/04/2019 02:11    Medications:  Prior to Admission:  Medications Prior to Admission  Medication Sig Dispense Refill Last Dose  . albuterol (VENTOLIN HFA) 108 (90 BASE) MCG/ACT inhaler Inhale 2 puffs into the lungs every 6 (six) hours as needed for wheezing or shortness of breath.     . alfuzosin (UROXATRAL) 10 MG 24 hr tablet Take 10 mg by mouth daily with breakfast.   05/03/2019 at Unknown time  . azithromycin (ZITHROMAX) 500 MG tablet 1 Tab daily per Dr Ivan Anchors medication 30 tablet 5 05/03/2019 at Unknown time  . benzonatate (TESSALON) 200 MG capsule Take 200 mg by  mouth 3 (three) times daily.   05/03/2019 at Unknown time  . budesonide (PULMICORT) 0.25 MG/2ML nebulizer solution Take 2 mLs (0.25 mg total) by nebulization 2 (two) times daily. 60 mL 12 05/03/2019 at Unknown time  . CARTIA XT 120 MG 24 hr capsule Take 1 capsule (120 mg total) by mouth daily. 7 capsule 0 05/03/2019 at Unknown time  . diphenhydrAMINE (BENADRYL) 25 mg capsule Take 25 mg by mouth every 8 (eight) hours as needed. Felt mouth swelling at ED visit 08/16/18.     [name] doxycycline (VIBRAMYCIN) 100 MG capsule TK 1 C PO BID   05/03/2019 at Unknown time  . fexofenadine (ALLEGRA) 180 MG tablet Take 180 mg by mouth daily.   05/03/2019 at Unknown time  . fluticasone (FLONASE) 50 MCG/ACT nasal spray Place 2 sprays into both nostrils daily as needed.   5   . Fluticasone-Umeclidin-Vilant (TRELEGY ELLIPTA) 100-62.5-25 MCG/INH AEPB Inhale  1 puff into the lungs daily.   05/03/2019 at Unknown time  . furosemide (LASIX) 40 MG tablet Take 1 tablet (40 mg total) by mouth daily. As needed for swelling 30 tablet 11 05/03/2019 at Unknown time  . insulin glargine (LANTUS) 100 UNIT/ML injection Inject 0.4 mLs (40 Units total) into the skin daily. 24 units daily 10 mL 2 05/03/2019 at Unknown time  . insulin lispro (HUMALOG) 100 UNIT/ML injection Take by sliding scale provided by hospital maximum amount daily is 60 units.  Check blood sugar 4 times a day (Patient taking differently: Inject 0-15 Units into the skin 3 (three) times daily with meals. Take by sliding scale provided by hospital maximum amount daily is 60 units.  Check blood sugar 4 times a day) 10 mL 11 05/03/2019 at Unknown time  . ipratropium (ATROVENT) 0.02 % nebulizer solution Take 0.5 mg by nebulization 2 (two) times daily.   5 05/03/2019 at Unknown time  . levalbuterol (XOPENEX) 0.63 MG/3ML nebulizer solution Take 0.63 mg by nebulization 2 (two) times daily.   05/03/2019 at Unknown time  . LORazepam (ATIVAN) 0.5 MG tablet Take 1 tablet (0.5 mg total) by  mouth 2 (two) times daily as needed for anxiety or sleep. (Patient taking differently: Take 0.5 mg by mouth 4 (four) times daily. Pt takes it three times daily.) 20 tablet 0 05/03/2019 at Unknown time  . magnesium 30 MG tablet Take 30 mg by mouth daily.   05/03/2019 at Unknown time  . metFORMIN (GLUCOPHAGE) 500 MG tablet Take 1 tablet (500 mg total) by mouth daily with breakfast. 30 tablet 3 05/03/2019 at Unknown time  . montelukast (SINGULAIR) 10 MG tablet Take 1 tablet by mouth daily.   05/03/2019 at Unknown time  . nitroGLYCERIN (NITROSTAT) 0.4 MG SL tablet Place 1 tablet (0.4 mg total) under the tongue every 5 (five) minutes as needed for chest pain. 25 tablet 4   . omeprazole (PRILOSEC) 20 MG capsule Take 1 capsule (20 mg total) by mouth 2 (two) times daily before a meal. 180 capsule 3 05/03/2019 at Unknown time  . oxyCODONE-acetaminophen (PERCOCET) 10-325 MG tablet Take 1 tablet by mouth every 4 (four) hours as needed for pain.   05/03/2019 at Unknown time  . potassium chloride SA (K-DUR,KLOR-CON) 20 MEQ tablet Take 1 tablet twice daily while on Lasix 60 tablet 2 05/03/2019 at Unknown time  . predniSONE (DELTASONE) 10 MG tablet Take 2 daily for 1 week then 1 daily (Patient taking differently: Take 20 mg by mouth daily with breakfast. Takes 2 tablets daily.) 60 tablet 5 05/03/2019 at Unknown time  . rifampin (RIFADIN) 300 MG capsule Take 2 capsules (600 mg total) by mouth daily. Long-term medication 60 capsule 11 05/03/2019 at Unknown time  . rosuvastatin (CRESTOR) 10 MG tablet Take 10 mg by mouth every morning.   11 05/03/2019 at Unknown time  . SYMBICORT 160-4.5 MCG/ACT inhaler INL 2 PFS PO BID   05/03/2019 at Unknown time  . tamsulosin (FLOMAX) 0.4 MG CAPS capsule Take 1 capsule (0.4 mg total) by mouth 2 (two) times daily. 60 capsule 2 05/03/2019 at Unknown time  . acetaminophen (TYLENOL) 325 MG tablet Take 2 tablets (650 mg total) by mouth every 6 (six) hours as needed for mild pain, moderate  pain, fever or headache (or Fever >/= 101). (Patient not taking: Reported on 05/04/2019) 12 tablet 1 Not Taking at Unknown time   Scheduled: . alfuzosin  10 mg Oral Q breakfast  . azithromycin  500  mg Oral Daily  . benzonatate  200 mg Oral TID  . budesonide  0.25 mg Nebulization BID  . diltiazem  120 mg Oral Daily  . furosemide  40 mg Intravenous Q12H  . guaiFENesin  600 mg Oral BID  . heparin  5,000 Units Subcutaneous Q8H  . insulin aspart  0-5 Units Subcutaneous QHS  . insulin aspart  0-9 Units Subcutaneous TID WC  . insulin glargine  40 Units Subcutaneous Daily  . ipratropium  0.5 mg Nebulization BID  . levalbuterol  1.25 mg Nebulization Q6H  . loratadine  10 mg Oral Daily  . magnesium oxide  200 mg Oral Daily  . metFORMIN  500 mg Oral Q breakfast  . methylPREDNISolone (SOLU-MEDROL) injection  40 mg Intravenous Q12H  . mometasone-formoterol  2 puff Inhalation BID  . pantoprazole (PROTONIX) IV  40 mg Intravenous Q12H  . potassium chloride SA  20 mEq Oral Daily  . rifampin  600 mg Oral Daily  . rosuvastatin  10 mg Oral q morning - 10a  . sodium chloride flush  3 mL Intravenous Q12H  . tamsulosin  0.4 mg Oral BID  . umeclidinium-vilanterol  1 puff Inhalation Daily   Continuous: . sodium chloride    . cefTRIAXone (ROCEPHIN)  IV     YQI:HKVQQV chloride, acetaminophen **OR** acetaminophen, diphenhydrAMINE, guaiFENesin-codeine, levalbuterol, LORazepam, nitroGLYCERIN, ondansetron **OR** ondansetron (ZOFRAN) IV, oxyCODONE-acetaminophen, polyethylene glycol, sodium chloride flush, traZODone  Assesment: He was admitted with COPD exacerbation, pneumonia and acute on chronic heart failure.  He is better but still very short of breath.  He developed atrial fib with RVR last night after a nebulizer treatment.  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  I will discuss with his infectious disease doctor  He has diabetes and is on sliding scale  He has heme positive stool and that will need to be evaluated probably as an outpatient Active Problems:   Dyspnea   Acute exacerbation of CHF (congestive heart failure) ([place])    Plan: Continue treatments.  He is to have a Doppler of the legs today.  He will have echocardiogram.  Continue Lasix continue antibiotics continue oxygen and steroids    LOS: 1 day   Ryan [name] 05/05/2019, 8:37 AM

## 2019-05-06 ENCOUNTER — Telehealth: Payer: Self-pay | Admitting: Pharmacist

## 2019-05-06 LAB — GLUCOSE, CAPILLARY
Glucose-Capillary: 278 mg/dL — ABNORMAL HIGH (ref 70–99)
Glucose-Capillary: 507 mg/dL (ref 70–99)
Glucose-Capillary: 143 mg/dL — ABNORMAL HIGH (ref 70–99)
Glucose-Capillary: 214 mg/dL — ABNORMAL HIGH (ref 70–99)

## 2019-05-06 LAB — CBC WITH DIFFERENTIAL/PLATELET
Abs Immature Granulocytes: 0.07 10*3/uL (ref 0.00–0.07)
Basophils Absolute: 0.1 10*3/uL (ref 0.0–0.1)
Basophils Relative: 1 %
Eosinophils Absolute: 0.4 10*3/uL (ref 0.0–0.5)
Eosinophils Relative: 6 %
HCT: 35.5 % — ABNORMAL LOW (ref 39.0–52.0)
Hemoglobin: 11 g/dL — ABNORMAL LOW (ref 13.0–17.0)
Immature Granulocytes: 1 %
Lymphocytes Relative: 29 %
Lymphs Abs: 2.1 10*3/uL (ref 0.7–4.0)
MCH: 27.6 pg (ref 26.0–34.0)
MCHC: 31 g/dL (ref 30.0–36.0)
MCV: 89 fL (ref 80.0–100.0)
Monocytes Absolute: 0.8 10*3/uL (ref 0.1–1.0)
Monocytes Relative: 11 %
Neutro Abs: 3.7 10*3/uL (ref 1.7–7.7)
Neutrophils Relative %: 52 %
Platelets: 244 10*3/uL (ref 150–400)
RBC: 3.99 MIL/uL — ABNORMAL LOW (ref 4.22–5.81)
RDW: 15.1 % (ref 11.5–15.5)
WBC: 7.1 10*3/uL (ref 4.0–10.5)
nRBC: 0.3 % — ABNORMAL HIGH (ref 0.0–0.2)

## 2019-05-06 LAB — BASIC METABOLIC PANEL
Anion gap: 12 (ref 5–15)
BUN: 16 mg/dL (ref 6–20)
CO2: 28 mmol/L (ref 22–32)
Calcium: 8.4 mg/dL — ABNORMAL LOW (ref 8.9–10.3)
Chloride: 95 mmol/L — ABNORMAL LOW (ref 98–111)
Creatinine, Ser: 1.06 mg/dL (ref 0.61–1.24)
GFR calc Af Amer: >60 mL/min (ref >60)
GFR calc non Af Amer: >60 mL/min (ref >60)
Glucose, Bld: 286 mg/dL — ABNORMAL HIGH (ref 70–99)
Potassium: 3.7 mmol/L (ref 3.5–5.1)
Sodium: 135 mmol/L (ref 135–145)

## 2019-05-06 LAB — GLUCOSE, RANDOM: Glucose, Bld: 437 mg/dL — ABNORMAL HIGH (ref 70–99)

## 2019-05-06 MED ORDER — INSULIN ASPART 100 UNIT/ML ~~LOC~~ SOLN
0.0000 [IU] | Freq: Every day | SUBCUTANEOUS | Status: DC
  Administered 2019-05-06 – 2019-05-07 (×2): 2 [IU] via SUBCUTANEOUS
  Administered 2019-05-08: 3 [IU] via SUBCUTANEOUS
  Administered 2019-05-09: 22:00:00 4 [IU] via SUBCUTANEOUS
  Administered 2019-05-10: 3 [IU] via SUBCUTANEOUS
  Administered 2019-05-12 – 2019-05-15 (×2): 2 [IU] via SUBCUTANEOUS
  Administered 2019-05-16: 23:00:00 3 [IU] via SUBCUTANEOUS
  Administered 2019-05-19 – 2019-05-23 (×2): 2 [IU] via SUBCUTANEOUS
  Administered 2019-05-25: 1 [IU] via SUBCUTANEOUS

## 2019-05-06 MED ORDER — CLOFAZIMINE POWD
100.0000 mg | Freq: Every day | Status: DC
  Administered 2019-05-06 – 2019-05-29 (×24): 100 mg
  Filled 2019-05-06: qty 1

## 2019-05-06 MED ORDER — INSULIN GLARGINE 100 UNIT/ML ~~LOC~~ SOLN
55.0000 [IU] | Freq: Every day | SUBCUTANEOUS | Status: DC
  Administered 2019-05-06 – 2019-05-09 (×4): 55 [IU] via SUBCUTANEOUS
  Filled 2019-05-06 (×5): qty 0.55

## 2019-05-06 MED ORDER — INSULIN ASPART 100 UNIT/ML ~~LOC~~ SOLN
0.0000 [IU] | Freq: Three times a day (TID) | SUBCUTANEOUS | Status: DC
  Administered 2019-05-06: 3 [IU] via SUBCUTANEOUS
  Administered 2019-05-07: 4 [IU] via SUBCUTANEOUS
  Administered 2019-05-07: 7 [IU] via SUBCUTANEOUS
  Administered 2019-05-07: 15 [IU] via SUBCUTANEOUS
  Administered 2019-05-08: 10:00:00 11 [IU] via SUBCUTANEOUS
  Administered 2019-05-09: 08:00:00 7 [IU] via SUBCUTANEOUS
  Administered 2019-05-09 – 2019-05-10 (×2): 20 [IU] via SUBCUTANEOUS
  Administered 2019-05-10 (×2): 11 [IU] via SUBCUTANEOUS
  Administered 2019-05-11: 15 [IU] via SUBCUTANEOUS
  Administered 2019-05-11: 7 [IU] via SUBCUTANEOUS
  Administered 2019-05-11: 14 [IU] via SUBCUTANEOUS
  Administered 2019-05-12: 20 [IU] via SUBCUTANEOUS
  Administered 2019-05-12: 7 [IU] via SUBCUTANEOUS
  Administered 2019-05-12: 11 [IU] via SUBCUTANEOUS
  Administered 2019-05-13: 15 [IU] via SUBCUTANEOUS
  Administered 2019-05-13: 09:00:00 7 [IU] via SUBCUTANEOUS
  Administered 2019-05-13: 15 [IU] via SUBCUTANEOUS
  Administered 2019-05-14 (×2): 11 [IU] via SUBCUTANEOUS
  Administered 2019-05-14: 20 [IU] via SUBCUTANEOUS
  Administered 2019-05-15: 11 [IU] via SUBCUTANEOUS
  Administered 2019-05-15: 4 [IU] via SUBCUTANEOUS
  Administered 2019-05-15: 13:00:00 11 [IU] via SUBCUTANEOUS
  Administered 2019-05-16: 20 [IU] via SUBCUTANEOUS
  Administered 2019-05-16 (×2): 7 [IU] via SUBCUTANEOUS
  Administered 2019-05-17: 20 [IU] via SUBCUTANEOUS
  Administered 2019-05-17 – 2019-05-18 (×3): 4 [IU] via SUBCUTANEOUS
  Administered 2019-05-18: 11 [IU] via SUBCUTANEOUS
  Administered 2019-05-18: 16:00:00 7 [IU] via SUBCUTANEOUS
  Administered 2019-05-19: 11 [IU] via SUBCUTANEOUS
  Administered 2019-05-19: 3 [IU] via SUBCUTANEOUS
  Administered 2019-05-19 – 2019-05-20 (×2): 4 [IU] via SUBCUTANEOUS
  Administered 2019-05-20: 7 [IU] via SUBCUTANEOUS
  Administered 2019-05-20 – 2019-05-21 (×3): 4 [IU] via SUBCUTANEOUS
  Administered 2019-05-21: 7 [IU] via SUBCUTANEOUS
  Administered 2019-05-22: 11 [IU] via SUBCUTANEOUS
  Administered 2019-05-22: 4 [IU] via SUBCUTANEOUS
  Administered 2019-05-23: 11 [IU] via SUBCUTANEOUS
  Administered 2019-05-23: 7 [IU] via SUBCUTANEOUS
  Administered 2019-05-24: 11 [IU] via SUBCUTANEOUS
  Administered 2019-05-24: 4 [IU] via SUBCUTANEOUS
  Administered 2019-05-24: 7 [IU] via SUBCUTANEOUS
  Administered 2019-05-25 (×2): 4 [IU] via SUBCUTANEOUS
  Administered 2019-05-25 – 2019-05-26 (×2): 7 [IU] via SUBCUTANEOUS
  Administered 2019-05-26: 3 [IU] via SUBCUTANEOUS
  Administered 2019-05-26: 7 [IU] via SUBCUTANEOUS
  Administered 2019-05-27: 15 [IU] via SUBCUTANEOUS
  Administered 2019-05-27: 09:00:00 7 [IU] via SUBCUTANEOUS
  Administered 2019-05-28: 12:00:00 11 [IU] via SUBCUTANEOUS
  Administered 2019-05-28: 09:00:00 7 [IU] via SUBCUTANEOUS
  Administered 2019-05-29: 17:00:00 4 [IU] via SUBCUTANEOUS
  Administered 2019-05-29: 08:00:00 11 [IU] via SUBCUTANEOUS
  Administered 2019-05-29: 14:00:00 4 [IU] via SUBCUTANEOUS

## 2019-05-06 MED ORDER — INSULIN ASPART 100 UNIT/ML ~~LOC~~ SOLN
20.0000 [IU] | Freq: Once | SUBCUTANEOUS | Status: AC
  Administered 2019-05-06: 20 [IU] via SUBCUTANEOUS

## 2019-05-06 NOTE — Progress Notes (Signed)
Subjective: He says he feels better.  He is still short of breath.  Still having coughing episodes.  He is back in sinus rhythm.  He had echocardiogram yesterday that shows pretty good left ventricular function.  Objective: Vital signs in last 24 hours: Temp:  [97.6 F (36.4 C)-98.3 F (36.8 C)] 98.3 F (36.8 C) (10/23 0525) Pulse Rate:  [96-111] 96 (10/23 0525) Resp:  [17-20] 17 (10/23 0525) BP: (105-128)/(68-72) 113/72 (10/23 0525) SpO2:  [94 %-99 %] 98 % (10/23 0525) FiO2 (%):  [0 %] 0 % (10/22 2040) Weight:  [85.3 kg] 85.3 kg (10/23 0500) Weight change: 4.152 kg Last BM Date: 05/05/19  Intake/Output from previous day: 10/22 0701 - 10/23 0700 In: 1080 [P.O.:1080] Out: 650 [Urine:650]  PHYSICAL EXAM General appearance: alert, cooperative and mild distress Resp: rhonchi bilaterally Cardio: regular rate and rhythm, S1, S2 normal, no murmur, click, rub or gallop GI: soft, non-tender; bowel sounds normal; no masses,  no organomegaly Extremities: extremities normal, atraumatic, no cyanosis or edema  Lab Results:  Results for orders placed or performed during the hospital encounter of 05/04/19 (from the past 48 hour(s))  CBG monitoring, ED     Status: Abnormal   Collection Time: 05/04/19  9:14 AM  Result Value Ref Range   Glucose-Capillary 279 (H) 70 - 99 mg/dL  POC occult blood, ED     Status: Abnormal   Collection Time: 05/04/19 10:29 AM  Result Value Ref Range   Fecal Occult Bld POSITIVE (A) NEGATIVE  CBG monitoring, ED     Status: Abnormal   Collection Time: 05/04/19 12:21 PM  Result Value Ref Range   Glucose-Capillary 283 (H) 70 - 99 mg/dL  CBG monitoring, ED     Status: Abnormal   Collection Time: 05/04/19  5:45 PM  Result Value Ref Range   Glucose-Capillary 313 (H) 70 - 99 mg/dL  Glucose, capillary     Status: Abnormal   Collection Time: 05/04/19  8:29 PM  Result Value Ref Range   Glucose-Capillary 282 (H) 70 - 99 mg/dL  Glucose, capillary     Status: Abnormal    Collection Time: 05/04/19 10:54 PM  Result Value Ref Range   Glucose-Capillary 403 (H) 70 - 99 mg/dL   Comment 1 Notify RN    Comment 2 Document in Chart   Glucose, random     Status: Abnormal   Collection Time: 05/04/19 11:49 PM  Result Value Ref Range   Glucose, Bld 374 (H) 70 - 99 mg/dL    Comment: Performed at [facility], [address] [place], [place] [address]  Basic metabolic panel     Status: Abnormal   Collection Time: 05/05/19  6:27 AM  Result Value Ref Range   Sodium 138 135 - 145 mmol/L   Potassium 3.5 3.5 - 5.1 mmol/L   Chloride 98 98 - 111 mmol/L   CO2 28 22 - 32 mmol/L   Glucose, Bld 265 (H) 70 - 99 mg/dL   BUN 14 6 - 20 mg/dL   Creatinine, Ser 1.17 0.61 - 1.24 mg/dL   Calcium 8.6 (L) 8.9 - 10.3 mg/dL   GFR calc non Af Amer >60 >60 mL/min   GFR calc Af Amer >60 >60 mL/min   Anion gap 12 5 - 15    Comment: Performed at [facility], [address], [place], [place] [address]  CBC     Status: Abnormal   Collection Time: 05/05/19  6:27 AM  Result Value Ref Range  WBC 7.6 4.0 - 10.5 K/uL   RBC 4.13 (L) 4.22 - 5.81 MIL/uL   Hemoglobin 11.5 (L) 13.0 - 17.0 g/dL   HCT 37.0 (L) 39.0 - 52.0 %   MCV 89.6 80.0 - 100.0 fL   MCH 27.8 26.0 - 34.0 pg   MCHC 31.1 30.0 - 36.0 g/dL   RDW 15.1 11.5 - 15.5 %   Platelets 279 150 - 400 K/uL   nRBC 0.3 (H) 0.0 - 0.2 %    Comment: Performed at [facility], [address] [place], [place] [address]  Protime-INR     Status: None   Collection Time: 05/05/19  6:27 AM  Result Value Ref Range   Prothrombin Time 13.7 11.4 - 15.2 seconds   INR 1.1 0.8 - 1.2    Comment: (NOTE) INR goal varies based on device and disease states. Performed at [facility], [address] [place], [place] [address]   Glucose, capillary     Status: Abnormal   Collection Time: 05/05/19  7:32 AM  Result Value Ref Range   Glucose-Capillary 287 (H) 70 - 99 mg/dL  Glucose, capillary     Status: Abnormal   Collection Time: 05/05/19 11:50 AM  Result  Value Ref Range   Glucose-Capillary 391 (H) 70 - 99 mg/dL  Glucose, capillary     Status: Abnormal   Collection Time: 05/05/19  4:54 PM  Result Value Ref Range   Glucose-Capillary 158 (H) 70 - 99 mg/dL  Glucose, capillary     Status: Abnormal   Collection Time: 05/05/19  8:32 PM  Result Value Ref Range   Glucose-Capillary 262 (H) 70 - 99 mg/dL   Comment 1 Notify RN   CBC with Differential/Platelet     Status: Abnormal   Collection Time: 05/06/19  6:21 AM  Result Value Ref Range   WBC 7.1 4.0 - 10.5 K/uL   RBC 3.99 (L) 4.22 - 5.81 MIL/uL   Hemoglobin 11.0 (L) 13.0 - 17.0 g/dL   HCT 35.5 (L) 39.0 - 52.0 %   MCV 89.0 80.0 - 100.0 fL   MCH 27.6 26.0 - 34.0 pg   MCHC 31.0 30.0 - 36.0 g/dL   RDW 15.1 11.5 - 15.5 %   Platelets 244 150 - 400 K/uL   nRBC 0.3 (H) 0.0 - 0.2 %   Neutrophils Relative % 52 %   Neutro Abs 3.7 1.7 - 7.7 K/uL   Lymphocytes Relative 29 %   Lymphs Abs 2.1 0.7 - 4.0 K/uL   Monocytes Relative 11 %   Monocytes Absolute 0.8 0.1 - 1.0 K/uL   Eosinophils Relative 6 %   Eosinophils Absolute 0.4 0.0 - 0.5 K/uL   Basophils Relative 1 %   Basophils Absolute 0.1 0.0 - 0.1 K/uL   Immature Granulocytes 1 %   Abs Immature Granulocytes 0.07 0.00 - 0.07 K/uL    Comment: Performed at [facility], [address], [place], [place] [address]  Basic metabolic panel     Status: Abnormal   Collection Time: 05/06/19  6:21 AM  Result Value Ref Range   Sodium 135 135 - 145 mmol/L   Potassium 3.7 3.5 - 5.1 mmol/L   Chloride 95 (L) 98 - 111 mmol/L   CO2 28 22 - 32 mmol/L   Glucose, Bld 286 (H) 70 - 99 mg/dL   BUN 16 6 - 20 mg/dL   Creatinine, Ser 1.06 0.61 - 1.24 mg/dL   Calcium 8.4 (L) 8.9 - 10.3 mg/dL   GFR  calc non Af Amer >60 >60 mL/min   GFR calc Af Amer >60 >60 mL/min   Anion gap 12 5 - 15    Comment: Performed at [facility], [address], [place], [place] [address]  Glucose, capillary     Status: Abnormal   Collection Time: 05/06/19  7:44 AM  Result Value Ref Range    Glucose-Capillary 278 (H) 70 - 99 mg/dL    ABGS No results for input(s): PHART, PO2ART, TCO2, HCO3 in the last 72 hours.  Invalid input(s): PCO2 CULTURES Recent Results (from the past 240 hour(s))  SARS CORONAVIRUS 2 (TAT 6-24 HRS) Nasopharyngeal Nasopharyngeal Swab     Status: None   Collection Time: 05/04/19  1:43 AM   Specimen: Nasopharyngeal Swab  Result Value Ref Range Status   SARS Coronavirus 2 NEGATIVE NEGATIVE Final    Comment: (NOTE) SARS-CoV-2 target nucleic acids are NOT DETECTED. The SARS-CoV-2 RNA is generally detectable in upper and lower respiratory specimens during the acute phase of infection. Negative results do not preclude SARS-CoV-2 infection, do not rule out co-infections with other pathogens, and should not be used as the sole basis for treatment or other patient management decisions. Negative results must be combined with clinical observations, patient history, and epidemiological information. The expected result is Negative. Fact Sheet for Patients: SugarRoll.be Fact Sheet for Healthcare Providers: https://www.woods-mathews.com/ This test is not yet approved or cleared by the [place] FDA and  has been authorized for detection and/or diagnosis of SARS-CoV-2 by FDA under an Emergency Use Authorization (EUA). This EUA will remain  in effect (meaning this test can be used) for the duration of the COVID-19 declaration under Section 56 4(b)(1) of the Act, 21 U.S.C. section 360bbb-3(b)(1), unless the authorization is terminated or revoked sooner. Performed at [facility], [place] [address] [place], [place] [address]    Studies/Results: US Venous Img Lower Unilateral Right  Result Date: 05/05/2019 CLINICAL DATA:  Swelling, pain EXAM: RIGHT LOWER EXTREMITY VENOUS DOPPLER ULTRASOUND TECHNIQUE: Gray-scale sonography with compression, as well as color and duplex ultrasound, were performed to evaluate the  deep venous system from the level of the common femoral vein through the popliteal and proximal calf veins. COMPARISON:  01/07/2019 FINDINGS: Normal compressibility of the common femoral, superficial femoral, and popliteal veins, as well as the proximal calf veins. No filling defects to suggest DVT on grayscale or color Doppler imaging. Doppler waveforms show normal direction of venous flow, normal respiratory phasicity and response to augmentation. Subcutaneous edema in the calf. Survey views of the contralateral common femoral vein are unremarkable. IMPRESSION: No femoropopliteal and no calf DVT in the visualized calf veins. If clinical symptoms are inconsistent or if there are persistent or worsening symptoms, further imaging (possibly involving the iliac veins) may be warranted. Electronically Signed   By: Lucrezia Europe M.D.   On: 05/05/2019 15:08    Medications:  Prior to Admission:  Medications Prior to Admission  Medication Sig Dispense Refill Last Dose  . albuterol (VENTOLIN HFA) 108 (90 BASE) MCG/ACT inhaler Inhale 2 puffs into the lungs every 6 (six) hours as needed for wheezing or shortness of breath.     . alfuzosin (UROXATRAL) 10 MG 24 hr tablet Take 10 mg by mouth daily with breakfast.   05/03/2019 at Unknown time  . azithromycin (ZITHROMAX) 500 MG tablet 1 Tab daily per Dr Ivan Anchors medication 30 tablet 5 05/03/2019 at Unknown time  . benzonatate (TESSALON) 200 MG capsule Take 200 mg by mouth 3 (  three) times daily.   05/03/2019 at Unknown time  . budesonide (PULMICORT) 0.25 MG/2ML nebulizer solution Take 2 mLs (0.25 mg total) by nebulization 2 (two) times daily. 60 mL 12 05/03/2019 at Unknown time  . CARTIA XT 120 MG 24 hr capsule Take 1 capsule (120 mg total) by mouth daily. 7 capsule 0 05/03/2019 at Unknown time  . diphenhydrAMINE (BENADRYL) 25 mg capsule Take 25 mg by mouth every 8 (eight) hours as needed. Felt mouth swelling at ED visit 08/16/18.     [name] doxycycline (VIBRAMYCIN)  100 MG capsule TK 1 C PO BID   05/03/2019 at Unknown time  . fexofenadine (ALLEGRA) 180 MG tablet Take 180 mg by mouth daily.   05/03/2019 at Unknown time  . fluticasone (FLONASE) 50 MCG/ACT nasal spray Place 2 sprays into both nostrils daily as needed.   5   . Fluticasone-Umeclidin-Vilant (TRELEGY ELLIPTA) 100-62.5-25 MCG/INH AEPB Inhale 1 puff into the lungs daily.   05/03/2019 at Unknown time  . furosemide (LASIX) 40 MG tablet Take 1 tablet (40 mg total) by mouth daily. As needed for swelling 30 tablet 11 05/03/2019 at Unknown time  . insulin glargine (LANTUS) 100 UNIT/ML injection Inject 0.4 mLs (40 Units total) into the skin daily. 24 units daily 10 mL 2 05/03/2019 at Unknown time  . insulin lispro (HUMALOG) 100 UNIT/ML injection Take by sliding scale provided by hospital maximum amount daily is 60 units.  Check blood sugar 4 times a day (Patient taking differently: Inject 0-15 Units into the skin 3 (three) times daily with meals. Take by sliding scale provided by hospital maximum amount daily is 60 units.  Check blood sugar 4 times a day) 10 mL 11 05/03/2019 at Unknown time  . ipratropium (ATROVENT) 0.02 % nebulizer solution Take 0.5 mg by nebulization 2 (two) times daily.   5 05/03/2019 at Unknown time  . levalbuterol (XOPENEX) 0.63 MG/3ML nebulizer solution Take 0.63 mg by nebulization 2 (two) times daily.   05/03/2019 at Unknown time  . LORazepam (ATIVAN) 0.5 MG tablet Take 1 tablet (0.5 mg total) by mouth 2 (two) times daily as needed for anxiety or sleep. (Patient taking differently: Take 0.5 mg by mouth 4 (four) times daily. Pt takes it three times daily.) 20 tablet 0 05/03/2019 at Unknown time  . magnesium 30 MG tablet Take 30 mg by mouth daily.   05/03/2019 at Unknown time  . metFORMIN (GLUCOPHAGE) 500 MG tablet Take 1 tablet (500 mg total) by mouth daily with breakfast. 30 tablet 3 05/03/2019 at Unknown time  . montelukast (SINGULAIR) 10 MG tablet Take 1 tablet by mouth daily.   05/03/2019  at Unknown time  . nitroGLYCERIN (NITROSTAT) 0.4 MG SL tablet Place 1 tablet (0.4 mg total) under the tongue every 5 (five) minutes as needed for chest pain. 25 tablet 4   . omeprazole (PRILOSEC) 20 MG capsule Take 1 capsule (20 mg total) by mouth 2 (two) times daily before a meal. 180 capsule 3 05/03/2019 at Unknown time  . oxyCODONE-acetaminophen (PERCOCET) 10-325 MG tablet Take 1 tablet by mouth every 4 (four) hours as needed for pain.   05/03/2019 at Unknown time  . potassium chloride SA (K-DUR,KLOR-CON) 20 MEQ tablet Take 1 tablet twice daily while on Lasix 60 tablet 2 05/03/2019 at Unknown time  . predniSONE (DELTASONE) 10 MG tablet Take 2 daily for 1 week then 1 daily (Patient taking differently: Take 20 mg by mouth daily with breakfast. Takes 2 tablets daily.) 60 tablet 5 05/03/2019  at Unknown time  . rifampin (RIFADIN) 300 MG capsule Take 2 capsules (600 mg total) by mouth daily. Long-term medication 60 capsule 11 05/03/2019 at Unknown time  . rosuvastatin (CRESTOR) 10 MG tablet Take 10 mg by mouth every morning.   11 05/03/2019 at Unknown time  . SYMBICORT 160-4.5 MCG/ACT inhaler INL 2 PFS PO BID   05/03/2019 at Unknown time  . tamsulosin (FLOMAX) 0.4 MG CAPS capsule Take 1 capsule (0.4 mg total) by mouth 2 (two) times daily. 60 capsule 2 05/03/2019 at Unknown time  . acetaminophen (TYLENOL) 325 MG tablet Take 2 tablets (650 mg total) by mouth every 6 (six) hours as needed for mild pain, moderate pain, fever or headache (or Fever >/= 101). (Patient not taking: Reported on 05/04/2019) 12 tablet 1 Not Taking at Unknown time   Scheduled: . azithromycin  500 mg Oral Daily  . benzonatate  200 mg Oral TID  . budesonide  0.25 mg Nebulization BID  . diltiazem  120 mg Oral Daily  . furosemide  40 mg Intravenous Q12H  . guaiFENesin  600 mg Oral BID  . heparin  5,000 Units Subcutaneous Q8H  . insulin aspart  0-5 Units Subcutaneous QHS  . insulin aspart  0-9 Units Subcutaneous TID WC  . insulin  aspart  6 Units Subcutaneous TID WC  . insulin glargine  55 Units Subcutaneous Daily  . ipratropium  0.5 mg Nebulization BID  . levalbuterol  1.25 mg Nebulization Q6H  . loratadine  10 mg Oral Daily  . magnesium oxide  200 mg Oral Daily  . metFORMIN  500 mg Oral Q breakfast  . methylPREDNISolone (SOLU-MEDROL) injection  40 mg Intravenous Q12H  . mometasone-formoterol  2 puff Inhalation BID  . pantoprazole (PROTONIX) IV  40 mg Intravenous Q12H  . potassium chloride SA  20 mEq Oral Daily  . rifampin  600 mg Oral Daily  . rosuvastatin  10 mg Oral q morning - 10a  . sodium chloride flush  3 mL Intravenous Q12H  . tamsulosin  0.4 mg Oral BID  . umeclidinium-vilanterol  1 puff Inhalation Daily   Continuous: . sodium chloride    . cefTRIAXone (ROCEPHIN)  IV 1 g (05/05/19 0858)   ZHY:QMVHQI chloride, acetaminophen **OR** acetaminophen, diphenhydrAMINE, HYDROcodone-homatropine, levalbuterol, LORazepam, nitroGLYCERIN, ondansetron **OR** ondansetron (ZOFRAN) IV, oxyCODONE-acetaminophen, polyethylene glycol, sodium chloride flush, traZODone  Assesment: He was admitted with shortness of breath which is multifactorial.  He appears to have pneumonia.  He has exacerbation of CHF but he has left ventricular systolic function looked pretty good.  He still has some swelling of his legs but his last.  He was negative for DVT.  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  He gets this [facility] the [facility] and I have called him this morning to see if we can get him more.  He says he only has 2 days worth.  He has chronic pain which is not changed  He has COPD at baseline and he is on steroids and inhaled bronchodilators  He has BPH and has had trouble with urinary retention but that is stable  He has diminished vision from taking ethambutol   Active Problems:   Dyspnea   Acute exacerbation of CHF  (congestive heart failure) ([place])    Plan: Continue current treatments he is improving    LOS: 2 days   Ryan [name] 05/06/2019, 8:06 AM

## 2019-05-06 NOTE — TOC Initial Note (Signed)
Transition of Care [facility]) - Initial/Assessment Note    Patient Details  Name: Ryan [name] MRN: [id number] Date of Birth: 17-Sep-1960  [facility] CM/SW Contact:    Shade Flood, LCSW Phone Number: 05/06/2019, 11:15 AM  Clinical Narrative:                  Pt admitted from home. He is high risk for readmission. Spoke with pt today to assess. Per pt, he lives with his wife at home and he plans to return home at dc. Pt states that he has been independent in ADLs prior to admission. Pt does not use any DME for ambulation but he states that he does have a cane and a walker if needed. Pt is on continuous O2 at home.  Per pt, he does not have any difficulty getting to appointments or obtaining medications. He does not expect to have any new TOC needs at dc.  TOC will follow and assist if needs arise.   Expected Discharge Plan: Home/Self Care Barriers to Discharge: Continued Medical Work up   Patient Goals and CMS Choice Patient states their goals for this hospitalization and ongoing recovery are:: Return home      Expected Discharge Plan and Services Expected Discharge Plan: Home/Self Care In-house Referral: Clinical Social Work     Living arrangements for the past 2 months: Mobile Home                                      Prior Living Arrangements/Services Living arrangements for the past 2 months: Mobile Home Lives with:: Spouse Patient language and need for interpreter reviewed:: Yes        Need for Family Participation in Patient Care: Yes (Comment) Care giver support system in place?: Yes (comment) Current home services: DME Criminal Activity/Legal Involvement Pertinent to Current Situation/Hospitalization: No - Comment as needed  Activities of Daily Living Home Assistive Devices/Equipment: Eyeglasses, Cane (specify quad or straight) ADL Screening (condition at time of admission) Patient's cognitive ability adequate to safely complete daily  activities?: Yes Is the patient deaf or have difficulty hearing?: No Does the patient have difficulty seeing, even when wearing glasses/contacts?: No Does the patient have difficulty concentrating, remembering, or making decisions?: No Patient able to express need for assistance with ADLs?: Yes Does the patient have difficulty dressing or bathing?: No Independently performs ADLs?: No Communication: Independent Dressing (OT): Needs assistance Is this a change from baseline?: Pre-admission baseline Grooming: Needs assistance Is this a change from baseline?: Pre-admission baseline Feeding: Independent Bathing: Needs assistance Is this a change from baseline?: Pre-admission baseline Toileting: Independent In/Out Bed: Independent Walks in Home: Independent Does the patient have difficulty walking or climbing stairs?: Yes Weakness of Legs: None Weakness of Arms/Hands: None  Permission Sought/Granted                  Emotional Assessment Appearance:: Appears stated age Attitude/Demeanor/Rapport: Engaged Affect (typically observed): Pleasant Orientation: : Oriented to Self, Oriented to  Time, Oriented to Situation, Oriented to Place Alcohol / Substance Use: Not Applicable Psych Involvement: No (comment)  Admission diagnosis:  Shortness of breath [R06.02] Extremity edema [R60.0] Chest rales [R09.89] Acute exacerbation of CHF (congestive heart failure) ([place]) [I50.9] Patient Active Problem List   Diagnosis Date Noted  . Dyspnea 05/04/2019  . Acute exacerbation of CHF (congestive heart failure) ([place]) 05/04/2019  . Urinary retention 09/17/2018  .  Chronic pain 09/17/2018  . Anxiety 09/17/2018  . Influenza due to identified novel influenza A virus with other respiratory manifestations 09/05/2018  . HCAP (healthcare-associated pneumonia) 08/26/2018  . GERD (gastroesophageal reflux disease) 08/04/2018  . Chest pain 06/20/2018  . Left-sided chest pain 06/19/2018  . Dysphagia  04/26/2018  . Acute on chronic respiratory failure with hypoxia ([place]) 04/20/2018  . Vision disorder 12/30/2017  . Umbilical hernia 63/89/3734  . Chronic diastolic CHF (congestive heart failure) ([place]) 10/24/2017  . Lower extremity edema 10/17/2017  . MAI (mycobacterium avium-intracellulare) ([place]) 08/29/2015  . Anemia of chronic disease   . Elevated liver enzymes   . Absolute anemia   . Pulmonary fibrosis ([place]) 05/10/2015  . Chronic respiratory failure with hypoxia ([place]) 05/10/2015  . Pulmonary nodule   . Pain, generalized 05/01/2015  . Laceration 05/01/2015  . PAF (paroxysmal atrial fibrillation) ([place])   . COPD exacerbation ([place])   . Protein-calorie malnutrition, severe ([place]) 10/21/2013  . Hemoptysis 10/20/2013  . Atrial fibrillation with rapid ventricular response ([place]) 07/10/2013  . Rotator cuff syndrome of left shoulder 01/20/2012  . Spondylosis, cervical 11/19/2011  . 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 Cavitary lesion of lung 05/07/2011  . GERD with stricture   . ASCVD (arteriosclerotic cardiovascular disease)   . Type 2 diabetes mellitus without complication ([employer])   . TOBACCO ABUSE 05/17/2010  . Asthma 01/20/2008  . Hyperlipidemia 12/27/2007  . 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  quit smoking 05/01/15  12/27/2007   PCP:  [name], MD Pharmacy:   [facility] [place], [place] [address] [place] [address] [place] [address] Phone: [phone] Fax: [phone]     Social Determinants of Health (SDOH) Interventions    Readmission Risk Interventions Readmission Risk Prevention Plan 05/06/2019  Transportation Screening Complete  Medication Review (RN Care Manager) Complete  PCP or Specialist appointment within 3-5 days of discharge Not Complete  PCP/Specialist Appt Not Complete comments Dr. Luan Pulling staff schedule their own appointments for hospital dc follow up  [place] Not Applicable  Some recent data might be hidden

## 2019-05-06 NOTE — Telephone Encounter (Signed)
Dr. Velvet Bathe called me this morning about patient's clofazimine.  He is taking azithromycin, rifampin, and clofazimine for his MAC lung infection. Patient is currently hospitalized and has not been taking clofazimine while in the hospital. He has 2 doses left at home (he missed his f/u with Dr. Linus Salmons earlier this month).   Dr. Luan Pulling asked about restarting medication in hospital which I advised him to do if possible.  I have 2 bottles (3 months worth) here in clinic for him.  I will coordinate couriering the medication to [facility] so patient can begin taking it while admitted and take it home with him when he is discharged. I spoke with Donna Christen Coffee, clinical pharmacist, at [facility] and notified him of medication coming to pharmacy.

## 2019-05-06 NOTE — Care Management Important Message (Signed)
Important Message  Patient Details  Name: Ryan [name] MRN: [id number] Date of Birth: 01/28/1961   Medicare Important Message Given:  Yes     [name] 05/06/2019, 12:10 PM

## 2019-05-07 LAB — GLUCOSE, CAPILLARY
Glucose-Capillary: 221 mg/dL — ABNORMAL HIGH (ref 70–99)
Glucose-Capillary: 332 mg/dL — ABNORMAL HIGH (ref 70–99)
Glucose-Capillary: 199 mg/dL — ABNORMAL HIGH (ref 70–99)
Glucose-Capillary: 201 mg/dL — ABNORMAL HIGH (ref 70–99)

## 2019-05-07 NOTE — Plan of Care (Signed)
  Problem: Education: Goal: Knowledge of General Education information will improve Description Including pain rating scale, medication(s)/side effects and non-pharmacologic comfort measures Outcome: Progressing   Problem: Health Behavior/Discharge Planning: Goal: Ability to manage health-related needs will improve Outcome: Progressing   

## 2019-05-07 NOTE — Progress Notes (Signed)
Subjective: He says he feels better.  He has less fluid in his legs.  He is still coughing.  His blood sugar has been up and I have adjusted his insulin.  I discussed his situation yesterday with the pharmacist [facility], Cassie Kuppelweiser and she was able to send up his clofazimine so he is back on that.  Objective: Vital signs in last 24 hours: Temp:  [97.8 F (36.6 C)-98.1 F (36.7 C)] 98 F (36.7 C) (10/24 0505) Pulse Rate:  [84-118] 84 (10/24 0505) Resp:  [18-22] 18 (10/24 0505) BP: (114-161)/(65-97) 114/65 (10/24 0505) SpO2:  [92 %-98 %] 94 % (10/24 0736) Weight:  [87.5 kg] (P) 87.5 kg (10/24 0505) Weight change:  Last BM Date: 05/05/19  Intake/Output from previous day: 10/23 0701 - 10/24 0700 In: 1300 [P.O.:1200; IV Piggyback:100] Out: 1500 [Urine:1500]  PHYSICAL EXAM General appearance: alert, cooperative and mild distress Resp: rhonchi bilaterally Cardio: regular rate and rhythm, S1, S2 normal, no murmur, click, rub or gallop GI: soft, non-tender; bowel sounds normal; no masses,  no organomegaly Extremities: extremities normal, atraumatic, no cyanosis or edema  Lab Results:  Results for orders placed or performed during the hospital encounter of 05/04/19 (from the past 48 hour(s))  Glucose, capillary     Status: Abnormal   Collection Time: 05/05/19 11:50 AM  Result Value Ref Range   Glucose-Capillary 391 (H) 70 - 99 mg/dL  Glucose, capillary     Status: Abnormal   Collection Time: 05/05/19  4:54 PM  Result Value Ref Range   Glucose-Capillary 158 (H) 70 - 99 mg/dL  Glucose, capillary     Status: Abnormal   Collection Time: 05/05/19  8:32 PM  Result Value Ref Range   Glucose-Capillary 262 (H) 70 - 99 mg/dL   Comment 1 Notify RN   CBC with Differential/Platelet     Status: Abnormal   Collection Time: 05/06/19  6:21 AM  Result Value Ref Range   WBC 7.1 4.0 - 10.5 K/uL   RBC 3.99 (L) 4.22 - 5.81 MIL/uL   Hemoglobin 11.0 (L)  13.0 - 17.0 g/dL   HCT 35.5 (L) 39.0 - 52.0 %   MCV 89.0 80.0 - 100.0 fL   MCH 27.6 26.0 - 34.0 pg   MCHC 31.0 30.0 - 36.0 g/dL   RDW 15.1 11.5 - 15.5 %   Platelets 244 150 - 400 K/uL   nRBC 0.3 (H) 0.0 - 0.2 %   Neutrophils Relative % 52 %   Neutro Abs 3.7 1.7 - 7.7 K/uL   Lymphocytes Relative 29 %   Lymphs Abs 2.1 0.7 - 4.0 K/uL   Monocytes Relative 11 %   Monocytes Absolute 0.8 0.1 - 1.0 K/uL   Eosinophils Relative 6 %   Eosinophils Absolute 0.4 0.0 - 0.5 K/uL   Basophils Relative 1 %   Basophils Absolute 0.1 0.0 - 0.1 K/uL   Immature Granulocytes 1 %   Abs Immature Granulocytes 0.07 0.00 - 0.07 K/uL    Comment: Performed at [facility], [address], [place], [place] [address]  Basic metabolic panel     Status: Abnormal   Collection Time: 05/06/19  6:21 AM  Result Value Ref Range   Sodium 135 135 - 145 mmol/L   Potassium 3.7 3.5 - 5.1 mmol/L   Chloride 95 (L) 98 - 111 mmol/L   CO2 28 22 - 32 mmol/L   Glucose, Bld 286 (H) 70 - 99 mg/dL   BUN 16  6 - 20 mg/dL   Creatinine, Ser 1.06 0.61 - 1.24 mg/dL   Calcium 8.4 (L) 8.9 - 10.3 mg/dL   GFR calc non Af Amer >60 >60 mL/min   GFR calc Af Amer >60 >60 mL/min   Anion gap 12 5 - 15    Comment: Performed at [facility], [address], [place], [place] [address]  Glucose, capillary     Status: Abnormal   Collection Time: 05/06/19  7:44 AM  Result Value Ref Range   Glucose-Capillary 278 (H) 70 - 99 mg/dL  Glucose, capillary     Status: Abnormal   Collection Time: 05/06/19 10:58 AM  Result Value Ref Range   Glucose-Capillary 507 (HH) 70 - 99 mg/dL   Comment 1 Notify RN   Glucose, random     Status: Abnormal   Collection Time: 05/06/19 11:38 AM  Result Value Ref Range   Glucose, Bld 437 (H) 70 - 99 mg/dL    Comment: Performed at [facility], [address] [place], [place] [address]  Glucose, capillary     Status: Abnormal   Collection Time: 05/06/19  4:46 PM  Result Value Ref Range   Glucose-Capillary 143 (H) 70 - 99  mg/dL   Comment 1 Notify RN    Comment 2 Document in Chart   Glucose, capillary     Status: Abnormal   Collection Time: 05/06/19  9:21 PM  Result Value Ref Range   Glucose-Capillary 214 (H) 70 - 99 mg/dL   Comment 1 Notify RN   Glucose, capillary     Status: Abnormal   Collection Time: 05/07/19  7:39 AM  Result Value Ref Range   Glucose-Capillary 221 (H) 70 - 99 mg/dL    ABGS No results for input(s): PHART, PO2ART, TCO2, HCO3 in the last 72 hours.  Invalid input(s): PCO2 CULTURES Recent Results (from the past 240 hour(s))  SARS CORONAVIRUS 2 (TAT 6-24 HRS) Nasopharyngeal Nasopharyngeal Swab     Status: None   Collection Time: 05/04/19  1:43 AM   Specimen: Nasopharyngeal Swab  Result Value Ref Range Status   SARS Coronavirus 2 NEGATIVE NEGATIVE Final    Comment: (NOTE) SARS-CoV-2 target nucleic acids are NOT DETECTED. The SARS-CoV-2 RNA is generally detectable in upper and lower respiratory specimens during the acute phase of infection. Negative results do not preclude SARS-CoV-2 infection, do not rule out co-infections with other pathogens, and should not be used as the sole basis for treatment or other patient management decisions. Negative results must be combined with clinical observations, patient history, and epidemiological information. The expected result is Negative. Fact Sheet for Patients: SugarRoll.be Fact Sheet for Healthcare Providers: https://www.woods-mathews.com/ This test is not yet approved or cleared by the [place] FDA and  has been authorized for detection and/or diagnosis of SARS-CoV-2 by FDA under an Emergency Use Authorization (EUA). This EUA will remain  in effect (meaning this test can be used) for the duration of the COVID-19 declaration under Section 56 4(b)(1) of the Act, 21 U.S.C. section 360bbb-3(b)(1), unless the authorization is terminated or revoked sooner. Performed at [facility], [place] [address] [place], [place] [address]    Studies/Results: US Venous Img Lower Unilateral Right  Result Date: 05/05/2019 CLINICAL DATA:  Swelling, pain EXAM: RIGHT LOWER EXTREMITY VENOUS DOPPLER ULTRASOUND TECHNIQUE: Gray-scale sonography with compression, as well as color and duplex ultrasound, were performed to evaluate the deep venous system from the level of the common femoral vein through the popliteal and proximal  calf veins. COMPARISON:  01/07/2019 FINDINGS: Normal compressibility of the common femoral, superficial femoral, and popliteal veins, as well as the proximal calf veins. No filling defects to suggest DVT on grayscale or color Doppler imaging. Doppler waveforms show normal direction of venous flow, normal respiratory phasicity and response to augmentation. Subcutaneous edema in the calf. Survey views of the contralateral common femoral vein are unremarkable. IMPRESSION: No femoropopliteal and no calf DVT in the visualized calf veins. If clinical symptoms are inconsistent or if there are persistent or worsening symptoms, further imaging (possibly involving the iliac veins) may be warranted. Electronically Signed   By: Lucrezia Europe M.D.   On: 05/05/2019 15:08    Medications:  Prior to Admission:  Medications Prior to Admission  Medication Sig Dispense Refill Last Dose  . albuterol (VENTOLIN HFA) 108 (90 BASE) MCG/ACT inhaler Inhale 2 puffs into the lungs every 6 (six) hours as needed for wheezing or shortness of breath.     . alfuzosin (UROXATRAL) 10 MG 24 hr tablet Take 10 mg by mouth daily with breakfast.   05/03/2019 at Unknown time  . azithromycin (ZITHROMAX) 500 MG tablet 1 Tab daily per Dr Ivan Anchors medication 30 tablet 5 05/03/2019 at Unknown time  . benzonatate (TESSALON) 200 MG capsule Take 200 mg by mouth 3 (three) times daily.   05/03/2019 at Unknown time  . budesonide (PULMICORT) 0.25 MG/2ML nebulizer solution Take 2 mLs (0.25 mg total) by nebulization 2 (two)  times daily. 60 mL 12 05/03/2019 at Unknown time  . CARTIA XT 120 MG 24 hr capsule Take 1 capsule (120 mg total) by mouth daily. 7 capsule 0 05/03/2019 at Unknown time  . diphenhydrAMINE (BENADRYL) 25 mg capsule Take 25 mg by mouth every 8 (eight) hours as needed. Felt mouth swelling at ED visit 08/16/18.     [name] doxycycline (VIBRAMYCIN) 100 MG capsule TK 1 C PO BID   05/03/2019 at Unknown time  . fexofenadine (ALLEGRA) 180 MG tablet Take 180 mg by mouth daily.   05/03/2019 at Unknown time  . fluticasone (FLONASE) 50 MCG/ACT nasal spray Place 2 sprays into both nostrils daily as needed.   5   . Fluticasone-Umeclidin-Vilant (TRELEGY ELLIPTA) 100-62.5-25 MCG/INH AEPB Inhale 1 puff into the lungs daily.   05/03/2019 at Unknown time  . furosemide (LASIX) 40 MG tablet Take 1 tablet (40 mg total) by mouth daily. As needed for swelling 30 tablet 11 05/03/2019 at Unknown time  . insulin glargine (LANTUS) 100 UNIT/ML injection Inject 0.4 mLs (40 Units total) into the skin daily. 24 units daily 10 mL 2 05/03/2019 at Unknown time  . insulin lispro (HUMALOG) 100 UNIT/ML injection Take by sliding scale provided by hospital maximum amount daily is 60 units.  Check blood sugar 4 times a day (Patient taking differently: Inject 0-15 Units into the skin 3 (three) times daily with meals. Take by sliding scale provided by hospital maximum amount daily is 60 units.  Check blood sugar 4 times a day) 10 mL 11 05/03/2019 at Unknown time  . ipratropium (ATROVENT) 0.02 % nebulizer solution Take 0.5 mg by nebulization 2 (two) times daily.   5 05/03/2019 at Unknown time  . levalbuterol (XOPENEX) 0.63 MG/3ML nebulizer solution Take 0.63 mg by nebulization 2 (two) times daily.   05/03/2019 at Unknown time  . LORazepam (ATIVAN) 0.5 MG tablet Take 1 tablet (0.5 mg total) by mouth 2 (two) times daily as needed for anxiety or sleep. (Patient taking differently: Take 0.5 mg by mouth 4 (  four) times daily. Pt takes it three times daily.) 20  tablet 0 05/03/2019 at Unknown time  . magnesium 30 MG tablet Take 30 mg by mouth daily.   05/03/2019 at Unknown time  . metFORMIN (GLUCOPHAGE) 500 MG tablet Take 1 tablet (500 mg total) by mouth daily with breakfast. 30 tablet 3 05/03/2019 at Unknown time  . montelukast (SINGULAIR) 10 MG tablet Take 1 tablet by mouth daily.   05/03/2019 at Unknown time  . nitroGLYCERIN (NITROSTAT) 0.4 MG SL tablet Place 1 tablet (0.4 mg total) under the tongue every 5 (five) minutes as needed for chest pain. 25 tablet 4   . omeprazole (PRILOSEC) 20 MG capsule Take 1 capsule (20 mg total) by mouth 2 (two) times daily before a meal. 180 capsule 3 05/03/2019 at Unknown time  . oxyCODONE-acetaminophen (PERCOCET) 10-325 MG tablet Take 1 tablet by mouth every 4 (four) hours as needed for pain.   05/03/2019 at Unknown time  . potassium chloride SA (K-DUR,KLOR-CON) 20 MEQ tablet Take 1 tablet twice daily while on Lasix 60 tablet 2 05/03/2019 at Unknown time  . predniSONE (DELTASONE) 10 MG tablet Take 2 daily for 1 week then 1 daily (Patient taking differently: Take 20 mg by mouth daily with breakfast. Takes 2 tablets daily.) 60 tablet 5 05/03/2019 at Unknown time  . rifampin (RIFADIN) 300 MG capsule Take 2 capsules (600 mg total) by mouth daily. Long-term medication 60 capsule 11 05/03/2019 at Unknown time  . rosuvastatin (CRESTOR) 10 MG tablet Take 10 mg by mouth every morning.   11 05/03/2019 at Unknown time  . SYMBICORT 160-4.5 MCG/ACT inhaler INL 2 PFS PO BID   05/03/2019 at Unknown time  . tamsulosin (FLOMAX) 0.4 MG CAPS capsule Take 1 capsule (0.4 mg total) by mouth 2 (two) times daily. 60 capsule 2 05/03/2019 at Unknown time  . acetaminophen (TYLENOL) 325 MG tablet Take 2 tablets (650 mg total) by mouth every 6 (six) hours as needed for mild pain, moderate pain, fever or headache (or Fever >/= 101). (Patient not taking: Reported on 05/04/2019) 12 tablet 1 Not Taking at Unknown time   Scheduled: . azithromycin  500 mg  Oral Daily  . benzonatate  200 mg Oral TID  . budesonide  0.25 mg Nebulization BID  . Clofazimine  100 mg Does not apply Daily  . diltiazem  120 mg Oral Daily  . furosemide  40 mg Intravenous Q12H  . guaiFENesin  600 mg Oral BID  . heparin  5,000 Units Subcutaneous Q8H  . insulin aspart  0-20 Units Subcutaneous TID WC  . insulin aspart  0-5 Units Subcutaneous QHS  . insulin aspart  6 Units Subcutaneous TID WC  . insulin glargine  55 Units Subcutaneous Daily  . ipratropium  0.5 mg Nebulization BID  . levalbuterol  1.25 mg Nebulization Q6H  . loratadine  10 mg Oral Daily  . magnesium oxide  200 mg Oral Daily  . metFORMIN  500 mg Oral Q breakfast  . methylPREDNISolone (SOLU-MEDROL) injection  40 mg Intravenous Q12H  . mometasone-formoterol  2 puff Inhalation BID  . pantoprazole (PROTONIX) IV  40 mg Intravenous Q12H  . potassium chloride SA  20 mEq Oral Daily  . rifampin  600 mg Oral Daily  . rosuvastatin  10 mg Oral q morning - 10a  . sodium chloride flush  3 mL Intravenous Q12H  . tamsulosin  0.4 mg Oral BID  . umeclidinium-vilanterol  1 puff Inhalation Daily   Continuous: . sodium  chloride    . cefTRIAXone (ROCEPHIN)  IV 1 g (05/06/19 0932)   AQT:MAUQJF chloride, acetaminophen **OR** acetaminophen, diphenhydrAMINE, HYDROcodone-homatropine, levalbuterol, LORazepam, nitroGLYCERIN, ondansetron **OR** ondansetron (ZOFRAN) IV, oxyCODONE-acetaminophen, polyethylene glycol, sodium chloride flush, traZODone  Assesment: He was admitted with increasing shortness of breath.  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  He is slowly improving.  He has less edema.  His breathing is better.  He still has significant cough.  Blood sugar has been elevated and I have adjusted his insulin and it seems to be doing better now.  Part of this is because he is on steroids Active Problems:   Dyspnea   Acute exacerbation of  CHF (congestive heart failure) ([place])    Plan: Continue current treatments.  He is improving.    LOS: 3 days   Alonza Bogus 05/07/2019, 8:58 AM

## 2019-05-08 LAB — BASIC METABOLIC PANEL
Anion gap: 9 (ref 5–15)
BUN: 22 mg/dL — ABNORMAL HIGH (ref 6–20)
CO2: 30 mmol/L (ref 22–32)
Calcium: 8.6 mg/dL — ABNORMAL LOW (ref 8.9–10.3)
Chloride: 96 mmol/L — ABNORMAL LOW (ref 98–111)
Creatinine, Ser: 1.06 mg/dL (ref 0.61–1.24)
GFR calc Af Amer: >60 mL/min (ref >60)
GFR calc non Af Amer: >60 mL/min (ref >60)
Glucose, Bld: 268 mg/dL — ABNORMAL HIGH (ref 70–99)
Potassium: 4.1 mmol/L (ref 3.5–5.1)
Sodium: 135 mmol/L (ref 135–145)

## 2019-05-08 LAB — GLUCOSE, CAPILLARY
Glucose-Capillary: 299 mg/dL — ABNORMAL HIGH (ref 70–99)
Glucose-Capillary: 423 mg/dL — ABNORMAL HIGH (ref 70–99)
Glucose-Capillary: 82 mg/dL (ref 70–99)
Glucose-Capillary: 283 mg/dL — ABNORMAL HIGH (ref 70–99)

## 2019-05-08 MED ORDER — INSULIN ASPART 100 UNIT/ML ~~LOC~~ SOLN
20.0000 [IU] | Freq: Once | SUBCUTANEOUS | Status: AC
  Administered 2019-05-08: 20 [IU] via SUBCUTANEOUS

## 2019-05-08 MED ORDER — POTASSIUM CHLORIDE CRYS ER 20 MEQ PO TBCR
40.0000 meq | EXTENDED_RELEASE_TABLET | Freq: Every day | ORAL | Status: DC
  Administered 2019-05-08 – 2019-05-29 (×22): 40 meq via ORAL
  Filled 2019-05-08 (×24): qty 2

## 2019-05-08 MED ORDER — FUROSEMIDE 10 MG/ML IJ SOLN
80.0000 mg | Freq: Two times a day (BID) | INTRAMUSCULAR | Status: DC
  Administered 2019-05-08 – 2019-05-19 (×23): 80 mg via INTRAVENOUS
  Filled 2019-05-08 (×24): qty 8

## 2019-05-08 MED ORDER — KETOROLAC TROMETHAMINE 15 MG/ML IJ SOLN
15.0000 mg | Freq: Three times a day (TID) | INTRAMUSCULAR | Status: AC | PRN
  Administered 2019-05-08 – 2019-05-13 (×7): 15 mg via INTRAVENOUS
  Filled 2019-05-08 (×6): qty 1

## 2019-05-08 MED ORDER — SALINE SPRAY 0.65 % NA SOLN
1.0000 | NASAL | Status: DC | PRN
  Filled 2019-05-08: qty 44

## 2019-05-08 NOTE — Progress Notes (Signed)
Subjective: He says he feels like he has a little more swelling of his legs.  His breathing is doing better.  He still has severe cough.  Objective: Vital signs in last 24 hours: Temp:  [97.8 F (36.6 C)-98.7 F (37.1 C)] 97.8 F (36.6 C) (10/25 0609) Pulse Rate:  [95-112] 96 (10/25 0609) Resp:  [18-20] 20 (10/25 0609) BP: (108-122)/(76-81) 108/80 (10/25 0609) SpO2:  [96 %-99 %] 98 % (10/25 0609) Weight:  [86.1 kg] 86.1 kg (10/25 0627) Weight change: -1.4 kg Last BM Date: 05/05/19  Intake/Output from previous day: 10/24 0701 - 10/25 0700 In: 720 [P.O.:720] Out: 1900 [Urine:1900]  PHYSICAL EXAM General appearance: alert, cooperative and no distress Resp: rhonchi bilaterally Cardio: regular rate and rhythm, S1, S2 normal, no murmur, click, rub or gallop GI: soft, non-tender; bowel sounds normal; no masses,  no organomegaly Extremities: Still has 2+ edema  Lab Results:  Results for orders placed or performed during the hospital encounter of 05/04/19 (from the past 48 hour(s))  Glucose, capillary     Status: Abnormal   Collection Time: 05/06/19 10:58 AM  Result Value Ref Range   Glucose-Capillary 507 (HH) 70 - 99 mg/dL   Comment 1 Notify RN   Glucose, random     Status: Abnormal   Collection Time: 05/06/19 11:38 AM  Result Value Ref Range   Glucose, Bld 437 (H) 70 - 99 mg/dL    Comment: Performed at [facility], [address] [place], [place] [address]  Glucose, capillary     Status: Abnormal   Collection Time: 05/06/19  4:46 PM  Result Value Ref Range   Glucose-Capillary 143 (H) 70 - 99 mg/dL   Comment 1 Notify RN    Comment 2 Document in Chart   Glucose, capillary     Status: Abnormal   Collection Time: 05/06/19  9:21 PM  Result Value Ref Range   Glucose-Capillary 214 (H) 70 - 99 mg/dL   Comment 1 Notify RN   Glucose, capillary     Status: Abnormal   Collection Time: 05/07/19  7:39 AM  Result Value Ref Range   Glucose-Capillary 221 (H) 70 - 99 mg/dL  Glucose,  capillary     Status: Abnormal   Collection Time: 05/07/19 11:22 AM  Result Value Ref Range   Glucose-Capillary 332 (H) 70 - 99 mg/dL  Glucose, capillary     Status: Abnormal   Collection Time: 05/07/19  3:53 PM  Result Value Ref Range   Glucose-Capillary 199 (H) 70 - 99 mg/dL  Glucose, capillary     Status: Abnormal   Collection Time: 05/07/19  9:41 PM  Result Value Ref Range   Glucose-Capillary 201 (H) 70 - 99 mg/dL   Comment 1 Notify RN    Comment 2 Document in Chart   Glucose, capillary     Status: Abnormal   Collection Time: 05/08/19  8:19 AM  Result Value Ref Range   Glucose-Capillary 299 (H) 70 - 99 mg/dL    ABGS No results for input(s): PHART, PO2ART, TCO2, HCO3 in the last 72 hours.  Invalid input(s): PCO2 CULTURES Recent Results (from the past 240 hour(s))  SARS CORONAVIRUS 2 (TAT 6-24 HRS) Nasopharyngeal Nasopharyngeal Swab     Status: None   Collection Time: 05/04/19  1:43 AM   Specimen: Nasopharyngeal Swab  Result Value Ref Range Status   SARS Coronavirus 2 NEGATIVE NEGATIVE Final    Comment: (NOTE) SARS-CoV-2 target nucleic acids are NOT DETECTED. The SARS-CoV-2 RNA is generally detectable  in upper and lower respiratory specimens during the acute phase of infection. Negative results do not preclude SARS-CoV-2 infection, do not rule out co-infections with other pathogens, and should not be used as the sole basis for treatment or other patient management decisions. Negative results must be combined with clinical observations, patient history, and epidemiological information. The expected result is Negative. Fact Sheet for Patients: SugarRoll.be Fact Sheet for Healthcare Providers: https://www.woods-mathews.com/ This test is not yet approved or cleared by the [place] FDA and  has been authorized for detection and/or diagnosis of SARS-CoV-2 by FDA under an Emergency Use Authorization (EUA). This EUA will remain   in effect (meaning this test can be used) for the duration of the COVID-19 declaration under Section 56 4(b)(1) of the Act, 21 U.S.C. section 360bbb-3(b)(1), unless the authorization is terminated or revoked sooner. Performed at [facility],  [address], [place], [place] [address]    Studies/Results: No results found.  Medications:  Prior to Admission:  Medications Prior to Admission  Medication Sig Dispense Refill Last Dose  . albuterol (VENTOLIN HFA) 108 (90 BASE) MCG/ACT inhaler Inhale 2 puffs into the lungs every 6 (six) hours as needed for wheezing or shortness of breath.     . alfuzosin (UROXATRAL) 10 MG 24 hr tablet Take 10 mg by mouth daily with breakfast.   05/03/2019 at Unknown time  . azithromycin (ZITHROMAX) 500 MG tablet 1 Tab daily per Dr Ivan Anchors medication 30 tablet 5 05/03/2019 at Unknown time  . benzonatate (TESSALON) 200 MG capsule Take 200 mg by mouth 3 (three) times daily.   05/03/2019 at Unknown time  . budesonide (PULMICORT) 0.25 MG/2ML nebulizer solution Take 2 mLs (0.25 mg total) by nebulization 2 (two) times daily. 60 mL 12 05/03/2019 at Unknown time  . CARTIA XT 120 MG 24 hr capsule Take 1 capsule (120 mg total) by mouth daily. 7 capsule 0 05/03/2019 at Unknown time  . diphenhydrAMINE (BENADRYL) 25 mg capsule Take 25 mg by mouth every 8 (eight) hours as needed. Felt mouth swelling at ED visit 08/16/18.     [name] doxycycline (VIBRAMYCIN) 100 MG capsule TK 1 C PO BID   05/03/2019 at Unknown time  . fexofenadine (ALLEGRA) 180 MG tablet Take 180 mg by mouth daily.   05/03/2019 at Unknown time  . fluticasone (FLONASE) 50 MCG/ACT nasal spray Place 2 sprays into both nostrils daily as needed.   5   . Fluticasone-Umeclidin-Vilant (TRELEGY ELLIPTA) 100-62.5-25 MCG/INH AEPB Inhale 1 puff into the lungs daily.   05/03/2019 at Unknown time  . furosemide (LASIX) 40 MG tablet Take 1 tablet (40 mg total) by mouth daily. As needed for swelling 30 tablet 11  05/03/2019 at Unknown time  . insulin glargine (LANTUS) 100 UNIT/ML injection Inject 0.4 mLs (40 Units total) into the skin daily. 24 units daily 10 mL 2 05/03/2019 at Unknown time  . insulin lispro (HUMALOG) 100 UNIT/ML injection Take by sliding scale provided by hospital maximum amount daily is 60 units.  Check blood sugar 4 times a day (Patient taking differently: Inject 0-15 Units into the skin 3 (three) times daily with meals. Take by sliding scale provided by hospital maximum amount daily is 60 units.  Check blood sugar 4 times a day) 10 mL 11 05/03/2019 at Unknown time  . ipratropium (ATROVENT) 0.02 % nebulizer solution Take 0.5 mg by nebulization 2 (two) times daily.   5 05/03/2019 at Unknown time  . levalbuterol (XOPENEX) 0.63 MG/3ML nebulizer solution Take 0.63  mg by nebulization 2 (two) times daily.   05/03/2019 at Unknown time  . LORazepam (ATIVAN) 0.5 MG tablet Take 1 tablet (0.5 mg total) by mouth 2 (two) times daily as needed for anxiety or sleep. (Patient taking differently: Take 0.5 mg by mouth 4 (four) times daily. Pt takes it three times daily.) 20 tablet 0 05/03/2019 at Unknown time  . magnesium 30 MG tablet Take 30 mg by mouth daily.   05/03/2019 at Unknown time  . metFORMIN (GLUCOPHAGE) 500 MG tablet Take 1 tablet (500 mg total) by mouth daily with breakfast. 30 tablet 3 05/03/2019 at Unknown time  . montelukast (SINGULAIR) 10 MG tablet Take 1 tablet by mouth daily.   05/03/2019 at Unknown time  . nitroGLYCERIN (NITROSTAT) 0.4 MG SL tablet Place 1 tablet (0.4 mg total) under the tongue every 5 (five) minutes as needed for chest pain. 25 tablet 4   . omeprazole (PRILOSEC) 20 MG capsule Take 1 capsule (20 mg total) by mouth 2 (two) times daily before a meal. 180 capsule 3 05/03/2019 at Unknown time  . oxyCODONE-acetaminophen (PERCOCET) 10-325 MG tablet Take 1 tablet by mouth every 4 (four) hours as needed for pain.   05/03/2019 at Unknown time  . potassium chloride SA (K-DUR,KLOR-CON)  20 MEQ tablet Take 1 tablet twice daily while on Lasix 60 tablet 2 05/03/2019 at Unknown time  . predniSONE (DELTASONE) 10 MG tablet Take 2 daily for 1 week then 1 daily (Patient taking differently: Take 20 mg by mouth daily with breakfast. Takes 2 tablets daily.) 60 tablet 5 05/03/2019 at Unknown time  . rifampin (RIFADIN) 300 MG capsule Take 2 capsules (600 mg total) by mouth daily. Long-term medication 60 capsule 11 05/03/2019 at Unknown time  . rosuvastatin (CRESTOR) 10 MG tablet Take 10 mg by mouth every morning.   11 05/03/2019 at Unknown time  . SYMBICORT 160-4.5 MCG/ACT inhaler INL 2 PFS PO BID   05/03/2019 at Unknown time  . tamsulosin (FLOMAX) 0.4 MG CAPS capsule Take 1 capsule (0.4 mg total) by mouth 2 (two) times daily. 60 capsule 2 05/03/2019 at Unknown time  . acetaminophen (TYLENOL) 325 MG tablet Take 2 tablets (650 mg total) by mouth every 6 (six) hours as needed for mild pain, moderate pain, fever or headache (or Fever >/= 101). (Patient not taking: Reported on 05/04/2019) 12 tablet 1 Not Taking at Unknown time   Scheduled: . azithromycin  500 mg Oral Daily  . benzonatate  200 mg Oral TID  . budesonide  0.25 mg Nebulization BID  . Clofazimine  100 mg Does not apply Daily  . diltiazem  120 mg Oral Daily  . furosemide  80 mg Intravenous Q12H  . guaiFENesin  600 mg Oral BID  . heparin  5,000 Units Subcutaneous Q8H  . insulin aspart  0-20 Units Subcutaneous TID WC  . insulin aspart  0-5 Units Subcutaneous QHS  . insulin aspart  6 Units Subcutaneous TID WC  . insulin glargine  55 Units Subcutaneous Daily  . ipratropium  0.5 mg Nebulization BID  . levalbuterol  1.25 mg Nebulization Q6H  . loratadine  10 mg Oral Daily  . magnesium oxide  200 mg Oral Daily  . metFORMIN  500 mg Oral Q breakfast  . methylPREDNISolone (SOLU-MEDROL) injection  40 mg Intravenous Q12H  . mometasone-formoterol  2 puff Inhalation BID  . pantoprazole (PROTONIX) IV  40 mg Intravenous Q12H  . potassium  chloride SA  40 mEq Oral Daily  . rifampin  600 mg Oral Daily  . rosuvastatin  10 mg Oral q morning - 10a  . sodium chloride flush  3 mL Intravenous Q12H  . tamsulosin  0.4 mg Oral BID  . umeclidinium-vilanterol  1 puff Inhalation Daily   Continuous: . sodium chloride    . cefTRIAXone (ROCEPHIN)  IV 1 g (05/07/19 1023)   OIB:BCWUGQ chloride, acetaminophen **OR** acetaminophen, diphenhydrAMINE, HYDROcodone-homatropine, ketorolac, levalbuterol, LORazepam, nitroGLYCERIN, ondansetron **OR** ondansetron (ZOFRAN) IV, oxyCODONE-acetaminophen, polyethylene glycol, sodium chloride flush, traZODone  Assesment: He was admitted with acute exacerbation of CHF and he is doing better but he still does have fluid.  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  Continue other treatments.    LOS: 4 days   Ryan [name] 05/08/2019, 8:41 AM

## 2019-05-09 LAB — BASIC METABOLIC PANEL
Anion gap: 14 (ref 5–15)
BUN: 22 mg/dL — ABNORMAL HIGH (ref 6–20)
CO2: 27 mmol/L (ref 22–32)
Calcium: 8.6 mg/dL — ABNORMAL LOW (ref 8.9–10.3)
Chloride: 95 mmol/L — ABNORMAL LOW (ref 98–111)
Creatinine, Ser: 1.1 mg/dL (ref 0.61–1.24)
GFR calc Af Amer: >60 mL/min (ref >60)
GFR calc non Af Amer: >60 mL/min (ref >60)
Glucose, Bld: 243 mg/dL — ABNORMAL HIGH (ref 70–99)
Potassium: 3.6 mmol/L (ref 3.5–5.1)
Sodium: 136 mmol/L (ref 135–145)

## 2019-05-09 LAB — CBC WITH DIFFERENTIAL/PLATELET
Abs Immature Granulocytes: 0.2 10*3/uL — ABNORMAL HIGH (ref 0.00–0.07)
Basophils Absolute: 0.1 10*3/uL (ref 0.0–0.1)
Basophils Relative: 1 %
Eosinophils Absolute: 0.5 10*3/uL (ref 0.0–0.5)
Eosinophils Relative: 6 %
HCT: 36.6 % — ABNORMAL LOW (ref 39.0–52.0)
Hemoglobin: 11.4 g/dL — ABNORMAL LOW (ref 13.0–17.0)
Immature Granulocytes: 2 %
Lymphocytes Relative: 26 %
Lymphs Abs: 2.3 10*3/uL (ref 0.7–4.0)
MCH: 27.8 pg (ref 26.0–34.0)
MCHC: 31.1 g/dL (ref 30.0–36.0)
MCV: 89.3 fL (ref 80.0–100.0)
Monocytes Absolute: 1.1 10*3/uL — ABNORMAL HIGH (ref 0.1–1.0)
Monocytes Relative: 12 %
Neutro Abs: 4.8 10*3/uL (ref 1.7–7.7)
Neutrophils Relative %: 53 %
Platelets: 280 10*3/uL (ref 150–400)
RBC: 4.1 MIL/uL — ABNORMAL LOW (ref 4.22–5.81)
RDW: 15.6 % — ABNORMAL HIGH (ref 11.5–15.5)
WBC: 8.9 10*3/uL (ref 4.0–10.5)
nRBC: 0.6 % — ABNORMAL HIGH (ref 0.0–0.2)

## 2019-05-09 LAB — GLUCOSE, CAPILLARY
Glucose-Capillary: 249 mg/dL — ABNORMAL HIGH (ref 70–99)
Glucose-Capillary: 422 mg/dL — ABNORMAL HIGH (ref 70–99)
Glucose-Capillary: 114 mg/dL — ABNORMAL HIGH (ref 70–99)
Glucose-Capillary: 301 mg/dL — ABNORMAL HIGH (ref 70–99)

## 2019-05-09 LAB — GLUCOSE, RANDOM: Glucose, Bld: 427 mg/dL — ABNORMAL HIGH (ref 70–99)

## 2019-05-09 MED ORDER — METHOCARBAMOL 500 MG PO TABS
500.0000 mg | ORAL_TABLET | Freq: Three times a day (TID) | ORAL | Status: DC | PRN
  Administered 2019-05-09 – 2019-05-29 (×28): 500 mg via ORAL
  Filled 2019-05-09 (×28): qty 1

## 2019-05-09 MED ORDER — INSULIN ASPART 100 UNIT/ML ~~LOC~~ SOLN
9.0000 [IU] | Freq: Three times a day (TID) | SUBCUTANEOUS | Status: DC
  Administered 2019-05-10 – 2019-05-13 (×11): 9 [IU] via SUBCUTANEOUS

## 2019-05-09 MED ORDER — LORAZEPAM 0.5 MG PO TABS
0.5000 mg | ORAL_TABLET | Freq: Four times a day (QID) | ORAL | Status: DC | PRN
  Administered 2019-05-09 – 2019-05-16 (×20): 0.5 mg via ORAL
  Filled 2019-05-09 (×21): qty 1

## 2019-05-09 MED ORDER — PANTOPRAZOLE SODIUM 40 MG PO TBEC
40.0000 mg | DELAYED_RELEASE_TABLET | Freq: Two times a day (BID) | ORAL | Status: DC
  Administered 2019-05-09 – 2019-05-29 (×40): 40 mg via ORAL
  Filled 2019-05-09 (×41): qty 1

## 2019-05-09 MED ORDER — INSULIN GLARGINE 100 UNIT/ML ~~LOC~~ SOLN
60.0000 [IU] | Freq: Every day | SUBCUTANEOUS | Status: DC
  Administered 2019-05-10 – 2019-05-13 (×4): 60 [IU] via SUBCUTANEOUS
  Filled 2019-05-09 (×7): qty 0.6

## 2019-05-09 NOTE — Progress Notes (Addendum)
Inpatient Diabetes Program Recommendations  AACE/ADA: New Consensus Statement on Inpatient Glycemic Control (2015)  Target Ranges:  Prepandial:   less than 140 mg/dL      Peak postprandial:   less than 180 mg/dL (1-2 hours)      Critically ill patients:  140 - 180 mg/dL   Lab Results  Component Value Date   GLUCAP 422 (H) 05/09/2019   HGBA1C 9.3 (H) 05/04/2019    Review of Glycemic Control Results for GALILEO, COLELLO (MRN [id number]) as of 05/09/2019 11:47  Ref. Range 05/08/2019 08:19 05/08/2019 11:11 05/08/2019 16:12 05/08/2019 21:57 05/09/2019 07:27 05/09/2019 11:27  Glucose-Capillary Latest Ref Range: 70 - 99 mg/dL 299 (H) 423 (H) 82 283 (H) 249 (H) 422 (H)   Diabetes history: DM 2 Outpatient Diabetes medications: Lantus 40 units, Humalog 0-15 units tid, Metformin 500 mg Daily  Current orders for Inpatient glycemic control:  Lantus 55 units Daily Novolog 0-20 units tid + hs Novolog 6 units tid meal coverage Metformin 500 mg Daily  Solumedrol 40 mg Q12 started.  Inpatient Diabetes Program Recommendations:    Fasting elevated in addition to glucose trends increase into the 400's after meals.  While on solumedrol consider:  Fasting 249 this am. Glucose increase significantly after meals.  Increase Novolog meal coverage to 10-12 units tid.  Paged Dr. Luan Pulling about plan of care for patient, waiting for call back.  Thanks,  Tama Headings RN, MSN, BC-ADM Inpatient Diabetes Coordinator Team Pager [phone] (8a-5p)

## 2019-05-09 NOTE — Progress Notes (Signed)
Subjective: He says he feels okay.  He still has swelling in his legs.  He is still coughing.  Objective: Vital signs in last 24 hours: Temp:  [97.8 F (36.6 C)-98.4 F (36.9 C)] 97.8 F (36.6 C) (10/26 0554) Pulse Rate:  [98-109] 98 (10/26 0554) Resp:  [20-22] 21 (10/26 0554) BP: (124-126)/(79-90) 124/79 (10/26 0554) SpO2:  [96 %-100 %] 96 % (10/26 0726) Weight:  [83.3 kg] 83.3 kg (10/26 0500) Weight change: -2.774 kg Last BM Date: 05/09/19  Intake/Output from previous day: 10/25 0701 - 10/26 0700 In: 720 [P.O.:720] Out: 775 [Urine:775]  PHYSICAL EXAM General appearance: alert, cooperative and mild distress Resp: rhonchi bilaterally Cardio: regular rate and rhythm, S1, S2 normal, no murmur, click, rub or gallop GI: soft, non-tender; bowel sounds normal; no masses,  no organomegaly Extremities: He still has edema of both legs but it is better  Lab Results:  Results for orders placed or performed during the hospital encounter of 05/04/19 (from the past 48 hour(s))  Glucose, capillary     Status: Abnormal   Collection Time: 05/07/19 11:22 AM  Result Value Ref Range   Glucose-Capillary 332 (H) 70 - 99 mg/dL  Glucose, capillary     Status: Abnormal   Collection Time: 05/07/19  3:53 PM  Result Value Ref Range   Glucose-Capillary 199 (H) 70 - 99 mg/dL  Glucose, capillary     Status: Abnormal   Collection Time: 05/07/19  9:41 PM  Result Value Ref Range   Glucose-Capillary 201 (H) 70 - 99 mg/dL   Comment 1 Notify RN    Comment 2 Document in Chart   Glucose, capillary     Status: Abnormal   Collection Time: 05/08/19  8:19 AM  Result Value Ref Range   Glucose-Capillary 299 (H) 70 - 99 mg/dL  Basic metabolic panel     Status: Abnormal   Collection Time: 05/08/19  8:38 AM  Result Value Ref Range   Sodium 135 135 - 145 mmol/L   Potassium 4.1 3.5 - 5.1 mmol/L   Chloride 96 (L) 98 - 111 mmol/L   CO2 30 22 - 32 mmol/L   Glucose, Bld 268 (H) 70 - 99 mg/dL   BUN 22 (H) 6 - 20  mg/dL   Creatinine, Ser 1.06 0.61 - 1.24 mg/dL   Calcium 8.6 (L) 8.9 - 10.3 mg/dL   GFR calc non Af Amer >60 >60 mL/min   GFR calc Af Amer >60 >60 mL/min   Anion gap 9 5 - 15    Comment: Performed at [facility], [address] [place], [place] [address]  Glucose, capillary     Status: Abnormal   Collection Time: 05/08/19 11:11 AM  Result Value Ref Range   Glucose-Capillary 423 (H) 70 - 99 mg/dL  Glucose, capillary     Status: None   Collection Time: 05/08/19  4:12 PM  Result Value Ref Range   Glucose-Capillary 82 70 - 99 mg/dL  Glucose, capillary     Status: Abnormal   Collection Time: 05/08/19  9:57 PM  Result Value Ref Range   Glucose-Capillary 283 (H) 70 - 99 mg/dL  CBC with Differential/Platelet     Status: Abnormal   Collection Time: 05/09/19  6:20 AM  Result Value Ref Range   WBC 8.9 4.0 - 10.5 K/uL   RBC 4.10 (L) 4.22 - 5.81 MIL/uL   Hemoglobin 11.4 (L) 13.0 - 17.0 g/dL   HCT 36.6 (L) 39.0 - 52.0 %   MCV 89.3 80.0 -  100.0 fL   MCH 27.8 26.0 - 34.0 pg   MCHC 31.1 30.0 - 36.0 g/dL   RDW 15.6 (H) 11.5 - 15.5 %   Platelets 280 150 - 400 K/uL   nRBC 0.6 (H) 0.0 - 0.2 %   Neutrophils Relative % 53 %   Neutro Abs 4.8 1.7 - 7.7 K/uL   Lymphocytes Relative 26 %   Lymphs Abs 2.3 0.7 - 4.0 K/uL   Monocytes Relative 12 %   Monocytes Absolute 1.1 (H) 0.1 - 1.0 K/uL   Eosinophils Relative 6 %   Eosinophils Absolute 0.5 0.0 - 0.5 K/uL   Basophils Relative 1 %   Basophils Absolute 0.1 0.0 - 0.1 K/uL   Immature Granulocytes 2 %   Abs Immature Granulocytes 0.20 (H) 0.00 - 0.07 K/uL    Comment: Performed at [facility], [address], [place], [place] [address]  Basic metabolic panel     Status: Abnormal   Collection Time: 05/09/19  6:20 AM  Result Value Ref Range   Sodium 136 135 - 145 mmol/L   Potassium 3.6 3.5 - 5.1 mmol/L   Chloride 95 (L) 98 - 111 mmol/L   CO2 27 22 - 32 mmol/L   Glucose, Bld 243 (H) 70 - 99 mg/dL   BUN 22 (H) 6 - 20 mg/dL   Creatinine, Ser 1.10 0.61  - 1.24 mg/dL   Calcium 8.6 (L) 8.9 - 10.3 mg/dL   GFR calc non Af Amer >60 >60 mL/min   GFR calc Af Amer >60 >60 mL/min   Anion gap 14 5 - 15    Comment: Performed at [facility], [address], [place], [place] [address]  Glucose, capillary     Status: Abnormal   Collection Time: 05/09/19  7:27 AM  Result Value Ref Range   Glucose-Capillary 249 (H) 70 - 99 mg/dL    ABGS No results for input(s): PHART, PO2ART, TCO2, HCO3 in the last 72 hours.  Invalid input(s): PCO2 CULTURES Recent Results (from the past 240 hour(s))  SARS CORONAVIRUS 2 (TAT 6-24 HRS) Nasopharyngeal Nasopharyngeal Swab     Status: None   Collection Time: 05/04/19  1:43 AM   Specimen: Nasopharyngeal Swab  Result Value Ref Range Status   SARS Coronavirus 2 NEGATIVE NEGATIVE Final    Comment: (NOTE) SARS-CoV-2 target nucleic acids are NOT DETECTED. The SARS-CoV-2 RNA is generally detectable in upper and lower respiratory specimens during the acute phase of infection. Negative results do not preclude SARS-CoV-2 infection, do not rule out co-infections with other pathogens, and should not be used as the sole basis for treatment or other patient management decisions. Negative results must be combined with clinical observations, patient history, and epidemiological information. The expected result is Negative. Fact Sheet for Patients: SugarRoll.be Fact Sheet for Healthcare Providers: https://www.woods-mathews.com/ This test is not yet approved or cleared by the [place] FDA and  has been authorized for detection and/or diagnosis of SARS-CoV-2 by FDA under an Emergency Use Authorization (EUA). This EUA will remain  in effect (meaning this test can be used) for the duration of the COVID-19 declaration under Section 56 4(b)(1) of the Act, 21 U.S.C. section 360bbb-3(b)(1), unless the authorization is terminated or revoked sooner. Performed at [facility],  [place] [address], [place], [place] [address]    Studies/Results: No results found.  Medications:  Prior to Admission:  Medications Prior to Admission  Medication Sig Dispense Refill Last Dose  . albuterol (VENTOLIN HFA) 108 (90 BASE)  MCG/ACT inhaler Inhale 2 puffs into the lungs every 6 (six) hours as needed for wheezing or shortness of breath.     . alfuzosin (UROXATRAL) 10 MG 24 hr tablet Take 10 mg by mouth daily with breakfast.   05/03/2019 at Unknown time  . azithromycin (ZITHROMAX) 500 MG tablet 1 Tab daily per Dr Ivan Anchors medication 30 tablet 5 05/03/2019 at Unknown time  . benzonatate (TESSALON) 200 MG capsule Take 200 mg by mouth 3 (three) times daily.   05/03/2019 at Unknown time  . budesonide (PULMICORT) 0.25 MG/2ML nebulizer solution Take 2 mLs (0.25 mg total) by nebulization 2 (two) times daily. 60 mL 12 05/03/2019 at Unknown time  . CARTIA XT 120 MG 24 hr capsule Take 1 capsule (120 mg total) by mouth daily. 7 capsule 0 05/03/2019 at Unknown time  . diphenhydrAMINE (BENADRYL) 25 mg capsule Take 25 mg by mouth every 8 (eight) hours as needed. Felt mouth swelling at ED visit 08/16/18.     [name] doxycycline (VIBRAMYCIN) 100 MG capsule TK 1 C PO BID   05/03/2019 at Unknown time  . fexofenadine (ALLEGRA) 180 MG tablet Take 180 mg by mouth daily.   05/03/2019 at Unknown time  . fluticasone (FLONASE) 50 MCG/ACT nasal spray Place 2 sprays into both nostrils daily as needed.   5   . Fluticasone-Umeclidin-Vilant (TRELEGY ELLIPTA) 100-62.5-25 MCG/INH AEPB Inhale 1 puff into the lungs daily.   05/03/2019 at Unknown time  . furosemide (LASIX) 40 MG tablet Take 1 tablet (40 mg total) by mouth daily. As needed for swelling 30 tablet 11 05/03/2019 at Unknown time  . insulin glargine (LANTUS) 100 UNIT/ML injection Inject 0.4 mLs (40 Units total) into the skin daily. 24 units daily 10 mL 2 05/03/2019 at Unknown time  . insulin lispro (HUMALOG) 100 UNIT/ML injection Take by sliding scale provided by  hospital maximum amount daily is 60 units.  Check blood sugar 4 times a day (Patient taking differently: Inject 0-15 Units into the skin 3 (three) times daily with meals. Take by sliding scale provided by hospital maximum amount daily is 60 units.  Check blood sugar 4 times a day) 10 mL 11 05/03/2019 at Unknown time  . ipratropium (ATROVENT) 0.02 % nebulizer solution Take 0.5 mg by nebulization 2 (two) times daily.   5 05/03/2019 at Unknown time  . levalbuterol (XOPENEX) 0.63 MG/3ML nebulizer solution Take 0.63 mg by nebulization 2 (two) times daily.   05/03/2019 at Unknown time  . LORazepam (ATIVAN) 0.5 MG tablet Take 1 tablet (0.5 mg total) by mouth 2 (two) times daily as needed for anxiety or sleep. (Patient taking differently: Take 0.5 mg by mouth 4 (four) times daily. Pt takes it three times daily.) 20 tablet 0 05/03/2019 at Unknown time  . magnesium 30 MG tablet Take 30 mg by mouth daily.   05/03/2019 at Unknown time  . metFORMIN (GLUCOPHAGE) 500 MG tablet Take 1 tablet (500 mg total) by mouth daily with breakfast. 30 tablet 3 05/03/2019 at Unknown time  . montelukast (SINGULAIR) 10 MG tablet Take 1 tablet by mouth daily.   05/03/2019 at Unknown time  . nitroGLYCERIN (NITROSTAT) 0.4 MG SL tablet Place 1 tablet (0.4 mg total) under the tongue every 5 (five) minutes as needed for chest pain. 25 tablet 4   . omeprazole (PRILOSEC) 20 MG capsule Take 1 capsule (20 mg total) by mouth 2 (two) times daily before a meal. 180 capsule 3 05/03/2019 at Unknown time  . oxyCODONE-acetaminophen (PERCOCET) 10-325 MG  tablet Take 1 tablet by mouth every 4 (four) hours as needed for pain.   05/03/2019 at Unknown time  . potassium chloride SA (K-DUR,KLOR-CON) 20 MEQ tablet Take 1 tablet twice daily while on Lasix 60 tablet 2 05/03/2019 at Unknown time  . predniSONE (DELTASONE) 10 MG tablet Take 2 daily for 1 week then 1 daily (Patient taking differently: Take 20 mg by mouth daily with breakfast. Takes 2 tablets daily.)  60 tablet 5 05/03/2019 at Unknown time  . rifampin (RIFADIN) 300 MG capsule Take 2 capsules (600 mg total) by mouth daily. Long-term medication 60 capsule 11 05/03/2019 at Unknown time  . rosuvastatin (CRESTOR) 10 MG tablet Take 10 mg by mouth every morning.   11 05/03/2019 at Unknown time  . SYMBICORT 160-4.5 MCG/ACT inhaler INL 2 PFS PO BID   05/03/2019 at Unknown time  . tamsulosin (FLOMAX) 0.4 MG CAPS capsule Take 1 capsule (0.4 mg total) by mouth 2 (two) times daily. 60 capsule 2 05/03/2019 at Unknown time  . acetaminophen (TYLENOL) 325 MG tablet Take 2 tablets (650 mg total) by mouth every 6 (six) hours as needed for mild pain, moderate pain, fever or headache (or Fever >/= 101). (Patient not taking: Reported on 05/04/2019) 12 tablet 1 Not Taking at Unknown time   Scheduled: . azithromycin  500 mg Oral Daily  . benzonatate  200 mg Oral TID  . budesonide  0.25 mg Nebulization BID  . Clofazimine  100 mg Does not apply Daily  . diltiazem  120 mg Oral Daily  . furosemide  80 mg Intravenous Q12H  . guaiFENesin  600 mg Oral BID  . heparin  5,000 Units Subcutaneous Q8H  . insulin aspart  0-20 Units Subcutaneous TID WC  . insulin aspart  0-5 Units Subcutaneous QHS  . insulin aspart  6 Units Subcutaneous TID WC  . insulin glargine  55 Units Subcutaneous Daily  . ipratropium  0.5 mg Nebulization BID  . levalbuterol  1.25 mg Nebulization Q6H  . loratadine  10 mg Oral Daily  . magnesium oxide  200 mg Oral Daily  . metFORMIN  500 mg Oral Q breakfast  . methylPREDNISolone (SOLU-MEDROL) injection  40 mg Intravenous Q12H  . mometasone-formoterol  2 puff Inhalation BID  . pantoprazole (PROTONIX) IV  40 mg Intravenous Q12H  . potassium chloride SA  40 mEq Oral Daily  . rifampin  600 mg Oral Daily  . rosuvastatin  10 mg Oral q morning - 10a  . sodium chloride flush  3 mL Intravenous Q12H  . tamsulosin  0.4 mg Oral BID  . umeclidinium-vilanterol  1 puff Inhalation Daily   Continuous: . sodium  chloride    . cefTRIAXone (ROCEPHIN)  IV 1 g (05/08/19 1004)   MOQ:HUTMLY chloride, acetaminophen **OR** acetaminophen, diphenhydrAMINE, HYDROcodone-homatropine, ketorolac, levalbuterol, LORazepam, nitroGLYCERIN, ondansetron **OR** ondansetron (ZOFRAN) IV, oxyCODONE-acetaminophen, polyethylene glycol, sodium chloride, sodium chloride flush, traZODone  Assesment: He was admitted with acute exacerbation of CHF.  He has exacerbation of COPD as well.  He is being treated for all of this.  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  Continue IV Lasix    LOS:  5 days   Ryan [name] 05/09/2019, 8:41 AM

## 2019-05-09 NOTE — Progress Notes (Signed)
PHARMACIST - PHYSICIAN COMMUNICATION  DR:  Luan Pulling  CONCERNING: IV- to -Oral Route Change Policy  RECOMMENDATION: This patient is receiving  pantoprazole by the intravenous route.  Based on criteria approved by the Pharmacy and Therapeutics Committee, the intravenous medication(s) is/are being converted to the equivalent oral dose form(s).   DESCRIPTION: These criteria include:  The patient is eating (either orally or via tube) and/or has been taking other orally administered medications for a least 24 hours  The patient has no evidence of active gastrointestinal bleeding or impaired GI absorption (gastrectomy, short bowel, patient on TNA or NPO).  If you have questions about this conversion, please contact the Pharmacy Department  [x]   [phone] )  [facility] []   [phone] )  [facility] []   [phone] )  [facility] []   [phone] )  [facility] []   [phone] )  [place], [facility] 05/09/2019 11:37 AM

## 2019-05-09 NOTE — Care Management Important Message (Signed)
Important Message  Patient Details  Name: Ryan [name] MRN: [id number] Date of Birth: Dec 07, 1960   Medicare Important Message Given:  Yes     [name] 05/09/2019, 1:31 PM

## 2019-05-10 LAB — GLUCOSE, CAPILLARY
Glucose-Capillary: 261 mg/dL — ABNORMAL HIGH (ref 70–99)
Glucose-Capillary: 424 mg/dL — ABNORMAL HIGH (ref 70–99)
Glucose-Capillary: 287 mg/dL — ABNORMAL HIGH (ref 70–99)
Glucose-Capillary: 287 mg/dL — ABNORMAL HIGH (ref 70–99)

## 2019-05-10 LAB — CBC WITH DIFFERENTIAL/PLATELET
Abs Immature Granulocytes: 0.18 10*3/uL — ABNORMAL HIGH (ref 0.00–0.07)
Basophils Absolute: 0.1 10*3/uL (ref 0.0–0.1)
Basophils Relative: 1 %
Eosinophils Absolute: 0.4 10*3/uL (ref 0.0–0.5)
Eosinophils Relative: 5 %
HCT: 35.9 % — ABNORMAL LOW (ref 39.0–52.0)
Hemoglobin: 11.1 g/dL — ABNORMAL LOW (ref 13.0–17.0)
Immature Granulocytes: 2 %
Lymphocytes Relative: 27 %
Lymphs Abs: 2.3 10*3/uL (ref 0.7–4.0)
MCH: 27.9 pg (ref 26.0–34.0)
MCHC: 30.9 g/dL (ref 30.0–36.0)
MCV: 90.2 fL (ref 80.0–100.0)
Monocytes Absolute: 0.9 10*3/uL (ref 0.1–1.0)
Monocytes Relative: 11 %
Neutro Abs: 4.8 10*3/uL (ref 1.7–7.7)
Neutrophils Relative %: 54 %
Platelets: 269 10*3/uL (ref 150–400)
RBC: 3.98 MIL/uL — ABNORMAL LOW (ref 4.22–5.81)
RDW: 15.7 % — ABNORMAL HIGH (ref 11.5–15.5)
WBC: 8.7 10*3/uL (ref 4.0–10.5)
nRBC: 0.6 % — ABNORMAL HIGH (ref 0.0–0.2)

## 2019-05-10 LAB — BASIC METABOLIC PANEL
Anion gap: 10 (ref 5–15)
BUN: 23 mg/dL — ABNORMAL HIGH (ref 6–20)
CO2: 29 mmol/L (ref 22–32)
Calcium: 8.5 mg/dL — ABNORMAL LOW (ref 8.9–10.3)
Chloride: 96 mmol/L — ABNORMAL LOW (ref 98–111)
Creatinine, Ser: 1.18 mg/dL (ref 0.61–1.24)
GFR calc Af Amer: >60 mL/min (ref >60)
GFR calc non Af Amer: >60 mL/min (ref >60)
Glucose, Bld: 290 mg/dL — ABNORMAL HIGH (ref 70–99)
Potassium: 3.5 mmol/L (ref 3.5–5.1)
Sodium: 135 mmol/L (ref 135–145)

## 2019-05-10 LAB — GLUCOSE, RANDOM: Glucose, Bld: 409 mg/dL — ABNORMAL HIGH (ref 70–99)

## 2019-05-10 MED ORDER — PREDNISONE 20 MG PO TABS
40.0000 mg | ORAL_TABLET | Freq: Every day | ORAL | Status: DC
  Administered 2019-05-10 – 2019-05-19 (×10): 40 mg via ORAL
  Filled 2019-05-10 (×10): qty 2

## 2019-05-10 NOTE — Progress Notes (Signed)
Inpatient Diabetes Program Recommendations  AACE/ADA: New Consensus Statement on Inpatient Glycemic Control (2015)  Target Ranges:  Prepandial:   less than 140 mg/dL      Peak postprandial:   less than 180 mg/dL (1-2 hours)      Critically ill patients:  140 - 180 mg/dL   Lab Results  Component Value Date   GLUCAP 261 (H) 05/10/2019   HGBA1C 9.3 (H) 05/04/2019    Review of Glycemic Control  Diabetes history: DM 2 Outpatient Diabetes medications: Lantus 40 units, Humalog 0-15 units tid, Metformin 500 mg Daily  Current orders for Inpatient glycemic control:  Lantus 60 units Daily Novolog 0-20 units tid + hs Novolog 9 units tid meal coverage Metformin 500 mg Daily  Solumedrol transitioned to PO Prednisone 40 mg Daily  Inpatient Diabetes Program Recommendations:    Noted Lantus 60 units, Novolog meal coverage increased. Will watch trends for today. Glucose trends should start to decrease.  Thanks,  Tama Headings RN, MSN, BC-ADM Inpatient Diabetes Coordinator Team Pager [phone] (8a-5p)

## 2019-05-10 NOTE — Progress Notes (Signed)
Subjective: He says he feels better.  His blood sugar was up again yesterday.  I am going to reduce his steroids.  He is still coughing but not as much.  He has less fluid now.  Objective: Vital signs in last 24 hours: Temp:  [97.4 F (36.3 C)-98.7 F (37.1 C)] 97.6 F (36.4 C) (10/27 0814) Pulse Rate:  [85-105] 85 (10/27 0814) Resp:  [14-21] 14 (10/27 0814) BP: (113-122)/(70-75) 113/70 (10/27 0814) SpO2:  [95 %-100 %] 100 % (10/27 0814) Weight change:  Last BM Date: 05/09/19  Intake/Output from previous day: 10/26 0701 - 10/27 0700 In: 723 [P.O.:720; I.V.:3] Out: 3325 [Urine:3325]  PHYSICAL EXAM General appearance: alert, cooperative and no distress Resp: rhonchi bilaterally Cardio: regular rate and rhythm, S1, S2 normal, no murmur, click, rub or gallop GI: soft, non-tender; bowel sounds normal; no masses,  no organomegaly Extremities: Still edema of both legs but much improved  Lab Results:  Results for orders placed or performed during the hospital encounter of 05/04/19 (from the past 48 hour(s))  Glucose, capillary     Status: Abnormal   Collection Time: 05/08/19 11:11 AM  Result Value Ref Range   Glucose-Capillary 423 (H) 70 - 99 mg/dL  Glucose, capillary     Status: None   Collection Time: 05/08/19  4:12 PM  Result Value Ref Range   Glucose-Capillary 82 70 - 99 mg/dL  Glucose, capillary     Status: Abnormal   Collection Time: 05/08/19  9:57 PM  Result Value Ref Range   Glucose-Capillary 283 (H) 70 - 99 mg/dL  CBC with Differential/Platelet     Status: Abnormal   Collection Time: 05/09/19  6:20 AM  Result Value Ref Range   WBC 8.9 4.0 - 10.5 K/uL   RBC 4.10 (L) 4.22 - 5.81 MIL/uL   Hemoglobin 11.4 (L) 13.0 - 17.0 g/dL   HCT 36.6 (L) 39.0 - 52.0 %   MCV 89.3 80.0 - 100.0 fL   MCH 27.8 26.0 - 34.0 pg   MCHC 31.1 30.0 - 36.0 g/dL   RDW 15.6 (H) 11.5 - 15.5 %   Platelets 280 150 - 400 K/uL   nRBC 0.6 (H) 0.0 - 0.2 %   Neutrophils Relative % 53 %   Neutro Abs  4.8 1.7 - 7.7 K/uL   Lymphocytes Relative 26 %   Lymphs Abs 2.3 0.7 - 4.0 K/uL   Monocytes Relative 12 %   Monocytes Absolute 1.1 (H) 0.1 - 1.0 K/uL   Eosinophils Relative 6 %   Eosinophils Absolute 0.5 0.0 - 0.5 K/uL   Basophils Relative 1 %   Basophils Absolute 0.1 0.0 - 0.1 K/uL   Immature Granulocytes 2 %   Abs Immature Granulocytes 0.20 (H) 0.00 - 0.07 K/uL    Comment: Performed at [facility], [address] [place],  [address]  Basic metabolic panel     Status: Abnormal   Collection Time: 05/09/19  6:20 AM  Result Value Ref Range   Sodium 136 135 - 145 mmol/L   Potassium 3.6 3.5 - 5.1 mmol/L   Chloride 95 (L) 98 - 111 mmol/L   CO2 27 22 - 32 mmol/L   Glucose, Bld 243 (H) 70 - 99 mg/dL   BUN 22 (H) 6 - 20 mg/dL   Creatinine, Ser 1.10 0.61 - 1.24 mg/dL   Calcium 8.6 (L) 8.9 - 10.3 mg/dL   GFR calc non Af Amer >60 >60 mL/min   GFR calc Af Amer >60 >60 mL/min  Anion gap 14 5 - 15    Comment: Performed at [facility], [address] [place], [place] [address]  Glucose, capillary     Status: Abnormal   Collection Time: 05/09/19  7:27 AM  Result Value Ref Range   Glucose-Capillary 249 (H) 70 - 99 mg/dL  Glucose, capillary     Status: Abnormal   Collection Time: 05/09/19 11:27 AM  Result Value Ref Range   Glucose-Capillary 422 (H) 70 - 99 mg/dL  Glucose, random     Status: Abnormal   Collection Time: 05/09/19 11:40 AM  Result Value Ref Range   Glucose, Bld 427 (H) 70 - 99 mg/dL    Comment: Performed at [facility], [address], [place], [place] [address]  Glucose, capillary     Status: Abnormal   Collection Time: 05/09/19  4:41 PM  Result Value Ref Range   Glucose-Capillary 114 (H) 70 - 99 mg/dL  Glucose, capillary     Status: Abnormal   Collection Time: 05/09/19  9:17 PM  Result Value Ref Range   Glucose-Capillary 301 (H) 70 - 99 mg/dL   Comment 1 Notify RN    Comment 2 Document in Chart   CBC with Differential/Platelet     Status: Abnormal   Collection  Time: 05/10/19  6:46 AM  Result Value Ref Range   WBC 8.7 4.0 - 10.5 K/uL   RBC 3.98 (L) 4.22 - 5.81 MIL/uL   Hemoglobin 11.1 (L) 13.0 - 17.0 g/dL   HCT 35.9 (L) 39.0 - 52.0 %   MCV 90.2 80.0 - 100.0 fL   MCH 27.9 26.0 - 34.0 pg   MCHC 30.9 30.0 - 36.0 g/dL   RDW 15.7 (H) 11.5 - 15.5 %   Platelets 269 150 - 400 K/uL   nRBC 0.6 (H) 0.0 - 0.2 %   Neutrophils Relative % 54 %   Neutro Abs 4.8 1.7 - 7.7 K/uL   Lymphocytes Relative 27 %   Lymphs Abs 2.3 0.7 - 4.0 K/uL   Monocytes Relative 11 %   Monocytes Absolute 0.9 0.1 - 1.0 K/uL   Eosinophils Relative 5 %   Eosinophils Absolute 0.4 0.0 - 0.5 K/uL   Basophils Relative 1 %   Basophils Absolute 0.1 0.0 - 0.1 K/uL   Immature Granulocytes 2 %   Abs Immature Granulocytes 0.18 (H) 0.00 - 0.07 K/uL    Comment: Performed at [facility], [address], [place], [place] [address]  Basic metabolic panel     Status: Abnormal   Collection Time: 05/10/19  6:46 AM  Result Value Ref Range   Sodium 135 135 - 145 mmol/L   Potassium 3.5 3.5 - 5.1 mmol/L   Chloride 96 (L) 98 - 111 mmol/L   CO2 29 22 - 32 mmol/L   Glucose, Bld 290 (H) 70 - 99 mg/dL   BUN 23 (H) 6 - 20 mg/dL   Creatinine, Ser 1.18 0.61 - 1.24 mg/dL   Calcium 8.5 (L) 8.9 - 10.3 mg/dL   GFR calc non Af Amer >60 >60 mL/min   GFR calc Af Amer >60 >60 mL/min   Anion gap 10 5 - 15    Comment: Performed at [facility], [address], [place], [place] [address]  Glucose, capillary     Status: Abnormal   Collection Time: 05/10/19  8:09 AM  Result Value Ref Range   Glucose-Capillary 261 (H) 70 - 99 mg/dL    ABGS No results for input(s): PHART, PO2ART, TCO2, HCO3  in the last 72 hours.  Invalid input(s): PCO2 CULTURES Recent Results (from the past 240 hour(s))  SARS CORONAVIRUS 2 (TAT 6-24 HRS) Nasopharyngeal Nasopharyngeal Swab     Status: None   Collection Time: 05/04/19  1:43 AM   Specimen: Nasopharyngeal Swab  Result Value Ref Range Status   SARS Coronavirus 2 NEGATIVE  NEGATIVE Final    Comment: (NOTE) SARS-CoV-2 target nucleic acids are NOT DETECTED. The SARS-CoV-2 RNA is generally detectable in upper and lower respiratory specimens during the acute phase of infection. Negative results do not preclude SARS-CoV-2 infection, do not rule out co-infections with other pathogens, and should not be used as the sole basis for treatment or other patient management decisions. Negative results must be combined with clinical observations, patient history, and epidemiological information. The expected result is Negative. Fact Sheet for Patients: SugarRoll.be Fact Sheet for Healthcare Providers: https://www.woods-mathews.com/ This test is not yet approved or cleared by the [place] FDA and  has been authorized for detection and/or diagnosis of SARS-CoV-2 by FDA under an Emergency Use Authorization (EUA). This EUA will remain  in effect (meaning this test can be used) for the duration of the COVID-19 declaration under Section 56 4(b)(1) of the Act, 21 U.S.C. section 360bbb-3(b)(1), unless the authorization is terminated or revoked sooner. Performed at [facility], [place] [address] [place], [place] [address]    Studies/Results: No results found.  Medications:  Prior to Admission:  Medications Prior to Admission  Medication Sig Dispense Refill Last Dose  . albuterol (VENTOLIN HFA) 108 (90 BASE) MCG/ACT inhaler Inhale 2 puffs into the lungs every 6 (six) hours as needed for wheezing or shortness of breath.     . alfuzosin (UROXATRAL) 10 MG 24 hr tablet Take 10 mg by mouth daily with breakfast.   05/03/2019 at Unknown time  . azithromycin (ZITHROMAX) 500 MG tablet 1 Tab daily per Dr Ivan Anchors medication 30 tablet 5 05/03/2019 at Unknown time  . benzonatate (TESSALON) 200 MG capsule Take 200 mg by mouth 3 (three) times daily.   05/03/2019 at Unknown time  . budesonide (PULMICORT) 0.25 MG/2ML nebulizer  solution Take 2 mLs (0.25 mg total) by nebulization 2 (two) times daily. 60 mL 12 05/03/2019 at Unknown time  . CARTIA XT 120 MG 24 hr capsule Take 1 capsule (120 mg total) by mouth daily. 7 capsule 0 05/03/2019 at Unknown time  . diphenhydrAMINE (BENADRYL) 25 mg capsule Take 25 mg by mouth every 8 (eight) hours as needed. Felt mouth swelling at ED visit 08/16/18.     [name] doxycycline (VIBRAMYCIN) 100 MG capsule TK 1 C PO BID   05/03/2019 at Unknown time  . fexofenadine (ALLEGRA) 180 MG tablet Take 180 mg by mouth daily.   05/03/2019 at Unknown time  . fluticasone (FLONASE) 50 MCG/ACT nasal spray Place 2 sprays into both nostrils daily as needed.   5   . Fluticasone-Umeclidin-Vilant (TRELEGY ELLIPTA) 100-62.5-25 MCG/INH AEPB Inhale 1 puff into the lungs daily.   05/03/2019 at Unknown time  . furosemide (LASIX) 40 MG tablet Take 1 tablet (40 mg total) by mouth daily. As needed for swelling 30 tablet 11 05/03/2019 at Unknown time  . insulin glargine (LANTUS) 100 UNIT/ML injection Inject 0.4 mLs (40 Units total) into the skin daily. 24 units daily 10 mL 2 05/03/2019 at Unknown time  . insulin lispro (HUMALOG) 100 UNIT/ML injection Take by sliding scale provided by hospital maximum amount daily is 60 units.  Check blood sugar 4 times a  day (Patient taking differently: Inject 0-15 Units into the skin 3 (three) times daily with meals. Take by sliding scale provided by hospital maximum amount daily is 60 units.  Check blood sugar 4 times a day) 10 mL 11 05/03/2019 at Unknown time  . ipratropium (ATROVENT) 0.02 % nebulizer solution Take 0.5 mg by nebulization 2 (two) times daily.   5 05/03/2019 at Unknown time  . levalbuterol (XOPENEX) 0.63 MG/3ML nebulizer solution Take 0.63 mg by nebulization 2 (two) times daily.   05/03/2019 at Unknown time  . LORazepam (ATIVAN) 0.5 MG tablet Take 1 tablet (0.5 mg total) by mouth 2 (two) times daily as needed for anxiety or sleep. (Patient taking differently: Take 0.5 mg by mouth  4 (four) times daily. Pt takes it three times daily.) 20 tablet 0 05/03/2019 at Unknown time  . magnesium 30 MG tablet Take 30 mg by mouth daily.   05/03/2019 at Unknown time  . metFORMIN (GLUCOPHAGE) 500 MG tablet Take 1 tablet (500 mg total) by mouth daily with breakfast. 30 tablet 3 05/03/2019 at Unknown time  . montelukast (SINGULAIR) 10 MG tablet Take 1 tablet by mouth daily.   05/03/2019 at Unknown time  . nitroGLYCERIN (NITROSTAT) 0.4 MG SL tablet Place 1 tablet (0.4 mg total) under the tongue every 5 (five) minutes as needed for chest pain. 25 tablet 4   . omeprazole (PRILOSEC) 20 MG capsule Take 1 capsule (20 mg total) by mouth 2 (two) times daily before a meal. 180 capsule 3 05/03/2019 at Unknown time  . oxyCODONE-acetaminophen (PERCOCET) 10-325 MG tablet Take 1 tablet by mouth every 4 (four) hours as needed for pain.   05/03/2019 at Unknown time  . potassium chloride SA (K-DUR,KLOR-CON) 20 MEQ tablet Take 1 tablet twice daily while on Lasix 60 tablet 2 05/03/2019 at Unknown time  . predniSONE (DELTASONE) 10 MG tablet Take 2 daily for 1 week then 1 daily (Patient taking differently: Take 20 mg by mouth daily with breakfast. Takes 2 tablets daily.) 60 tablet 5 05/03/2019 at Unknown time  . rifampin (RIFADIN) 300 MG capsule Take 2 capsules (600 mg total) by mouth daily. Long-term medication 60 capsule 11 05/03/2019 at Unknown time  . rosuvastatin (CRESTOR) 10 MG tablet Take 10 mg by mouth every morning.   11 05/03/2019 at Unknown time  . SYMBICORT 160-4.5 MCG/ACT inhaler INL 2 PFS PO BID   05/03/2019 at Unknown time  . tamsulosin (FLOMAX) 0.4 MG CAPS capsule Take 1 capsule (0.4 mg total) by mouth 2 (two) times daily. 60 capsule 2 05/03/2019 at Unknown time  . acetaminophen (TYLENOL) 325 MG tablet Take 2 tablets (650 mg total) by mouth every 6 (six) hours as needed for mild pain, moderate pain, fever or headache (or Fever >/= 101). (Patient not taking: Reported on 05/04/2019) 12 tablet 1 Not  Taking at Unknown time   Scheduled: . azithromycin  500 mg Oral Daily  . benzonatate  200 mg Oral TID  . budesonide  0.25 mg Nebulization BID  . Clofazimine  100 mg Does not apply Daily  . diltiazem  120 mg Oral Daily  . furosemide  80 mg Intravenous Q12H  . guaiFENesin  600 mg Oral BID  . heparin  5,000 Units Subcutaneous Q8H  . insulin aspart  0-20 Units Subcutaneous TID WC  . insulin aspart  0-5 Units Subcutaneous QHS  . insulin aspart  9 Units Subcutaneous TID WC  . insulin glargine  60 Units Subcutaneous Daily  . ipratropium  0.5  mg Nebulization BID  . levalbuterol  1.25 mg Nebulization Q6H  . loratadine  10 mg Oral Daily  . magnesium oxide  200 mg Oral Daily  . metFORMIN  500 mg Oral Q breakfast  . methylPREDNISolone (SOLU-MEDROL) injection  40 mg Intravenous Q12H  . mometasone-formoterol  2 puff Inhalation BID  . pantoprazole  40 mg Oral BID AC  . potassium chloride SA  40 mEq Oral Daily  . rifampin  600 mg Oral Daily  . rosuvastatin  10 mg Oral q morning - 10a  . sodium chloride flush  3 mL Intravenous Q12H  . tamsulosin  0.4 mg Oral BID  . umeclidinium-vilanterol  1 puff Inhalation Daily   Continuous: . sodium chloride    . cefTRIAXone (ROCEPHIN)  IV 1 g (05/09/19 0842)   VGC:YOYOOJ chloride, acetaminophen **OR** acetaminophen, diphenhydrAMINE, HYDROcodone-homatropine, ketorolac, levalbuterol, LORazepam, methocarbamol, nitroGLYCERIN, ondansetron **OR** ondansetron (ZOFRAN) IV, oxyCODONE-acetaminophen, polyethylene glycol, sodium chloride, sodium chloride flush, traZODone  Assesment: He was admitted with acute exacerbation of heart failure.  He also has pneumonia and that is improving.  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  He is slowly improving.  He has diabetes and his blood sugar still elevated Active Problems:   Dyspnea   Acute exacerbation of CHF (congestive heart failure) (HCC)    Plan: Switch to prednisone.  Continue  other treatments.    LOS: 6 days   Alonza Bogus 05/10/2019, 8:40 AM

## 2019-05-11 LAB — BASIC METABOLIC PANEL
Anion gap: 11 (ref 5–15)
BUN: 23 mg/dL — ABNORMAL HIGH (ref 6–20)
CO2: 29 mmol/L (ref 22–32)
Calcium: 8.5 mg/dL — ABNORMAL LOW (ref 8.9–10.3)
Chloride: 96 mmol/L — ABNORMAL LOW (ref 98–111)
Creatinine, Ser: 1.24 mg/dL (ref 0.61–1.24)
GFR calc Af Amer: >60 mL/min (ref >60)
GFR calc non Af Amer: >60 mL/min (ref >60)
Glucose, Bld: 232 mg/dL — ABNORMAL HIGH (ref 70–99)
Potassium: 3.7 mmol/L (ref 3.5–5.1)
Sodium: 136 mmol/L (ref 135–145)

## 2019-05-11 LAB — GLUCOSE, CAPILLARY
Glucose-Capillary: 212 mg/dL — ABNORMAL HIGH (ref 70–99)
Glucose-Capillary: 410 mg/dL — ABNORMAL HIGH (ref 70–99)
Glucose-Capillary: 337 mg/dL — ABNORMAL HIGH (ref 70–99)
Glucose-Capillary: 174 mg/dL — ABNORMAL HIGH (ref 70–99)

## 2019-05-11 LAB — CBC WITH DIFFERENTIAL/PLATELET
Abs Immature Granulocytes: 0.27 10*3/uL — ABNORMAL HIGH (ref 0.00–0.07)
Basophils Absolute: 0.1 10*3/uL (ref 0.0–0.1)
Basophils Relative: 1 %
Eosinophils Absolute: 0.4 10*3/uL (ref 0.0–0.5)
Eosinophils Relative: 4 %
HCT: 35.5 % — ABNORMAL LOW (ref 39.0–52.0)
Hemoglobin: 10.9 g/dL — ABNORMAL LOW (ref 13.0–17.0)
Immature Granulocytes: 3 %
Lymphocytes Relative: 27 %
Lymphs Abs: 2.5 10*3/uL (ref 0.7–4.0)
MCH: 27.7 pg (ref 26.0–34.0)
MCHC: 30.7 g/dL (ref 30.0–36.0)
MCV: 90.1 fL (ref 80.0–100.0)
Monocytes Absolute: 1.3 10*3/uL — ABNORMAL HIGH (ref 0.1–1.0)
Monocytes Relative: 13 %
Neutro Abs: 5 10*3/uL (ref 1.7–7.7)
Neutrophils Relative %: 52 %
Platelets: 258 10*3/uL (ref 150–400)
RBC: 3.94 MIL/uL — ABNORMAL LOW (ref 4.22–5.81)
RDW: 15.7 % — ABNORMAL HIGH (ref 11.5–15.5)
WBC: 9.4 10*3/uL (ref 4.0–10.5)
nRBC: 1.1 % — ABNORMAL HIGH (ref 0.0–0.2)

## 2019-05-11 MED ORDER — ENSURE ENLIVE PO LIQD
237.0000 mL | Freq: Two times a day (BID) | ORAL | Status: DC
  Administered 2019-05-12 – 2019-05-28 (×24): 237 mL via ORAL

## 2019-05-11 NOTE — Progress Notes (Signed)
Nutrition Brief Note  Patient seen for length of stay Patient sitting along the side of the bed eating lunch at RD visit. He endorses good appetite and intake during admission and stated "it's been great thanks to the steroids" Current diet order is HH, patient is consuming 75-100% of meals at this time.  Patient reports decreased appetite/intake prior to admission related to SOB and not feeling well. He endorses daily Ensure at home, RD encouraged patient to have small frequent meals verses 3 large meals/day and to continue with nutrition supplement. RD will provide Ensure per patient request.   Medications and labs reviewed  Body mass index is 27.99 kg/m. Patient meets criteria for overweight based on current BMI.   No nutrition interventions warranted at this time. If nutrition issues arise, please consult RD.   Ryan [name], [place], [place] Clinical Nutrition Office [phone] After Hours/Weekend Pager: [phone]

## 2019-05-11 NOTE — TOC Progression Note (Signed)
Transition of Care [facility]) - Progression Note    Patient Details  Name: Ryan [name] MRN: [id number] Date of Birth: [birth year]  [facility] CM/SW Contact  Shade Flood, LCSW Phone Number: 05/11/2019, 9:21 AM  Clinical Narrative:     TOC following with chart review. Per MD notes, pt not yet medically stable for dc. Pt had previously indicated that he was not interested in [facility] at dc. TOC will follow up with pt once he progresses towards dc to offer again. No other TOC needs at this time.  Expected Discharge Plan: Home/Self Care Barriers to Discharge: Continued Medical Work up  Expected Discharge Plan and Services Expected Discharge Plan: Home/Self Care In-house Referral: Clinical Social Work     Living arrangements for the past 2 months: Mobile Home                                       Social Determinants of Health (SDOH) Interventions    Readmission Risk Interventions Readmission Risk Prevention Plan 05/06/2019  Transportation Screening Complete  Medication Review Press photographer) Complete  PCP or Specialist appointment within 3-5 days of discharge Not Complete  PCP/Specialist Appt Not Complete comments Dr. Luan Pulling staff schedule their own appointments for hospital dc follow up  [place] or [place] Patient refused  SW Recovery Care/Counseling Consult Complete  Skilled [place] Not Applicable  Some recent data might be hidden

## 2019-05-11 NOTE — Progress Notes (Signed)
Subjective: He says he feels okay.  He is still coughing mostly nonproductively.  Blood sugar still in the 200s yesterday and this morning.  We will see if switching to prednisone and the changes in his insulin will start to allow blood sugar to trend downward.  He still has swelling of both legs.  Objective: Vital signs in last 24 hours: Temp:  [98.1 F (36.7 C)-98.2 F (36.8 C)] 98.1 F (36.7 C) (10/28 0507) Pulse Rate:  [101-111] 101 (10/28 0507) Resp:  [18-20] 18 (10/28 0507) BP: (109-117)/(69-76) 109/76 (10/28 0507) SpO2:  [97 %-99 %] 97 % (10/28 0757) Weight:  [83.5 kg] 83.5 kg (10/28 0500) Weight change:  Last BM Date: 05/10/19  Intake/Output from previous day: 10/27 0701 - 10/28 0700 In: 400 [P.O.:400] Out: 2250 [Urine:2250]  PHYSICAL EXAM General appearance: alert, cooperative and no distress Resp: rhonchi bilaterally Cardio: regular rate and rhythm, S1, S2 normal, no murmur, click, rub or gallop GI: soft, non-tender; bowel sounds normal; no masses,  no organomegaly Extremities: He still has significant edema of both legs  Lab Results:  Results for orders placed or performed during the hospital encounter of 05/04/19 (from the past 48 hour(s))  Glucose, capillary     Status: Abnormal   Collection Time: 05/09/19 11:27 AM  Result Value Ref Range   Glucose-Capillary 422 (H) 70 - 99 mg/dL  Glucose, random     Status: Abnormal   Collection Time: 05/09/19 11:40 AM  Result Value Ref Range   Glucose, Bld 427 (H) 70 - 99 mg/dL    Comment: Performed at [facility], [address] [place], [place] [address]  Glucose, capillary     Status: Abnormal   Collection Time: 05/09/19  4:41 PM  Result Value Ref Range   Glucose-Capillary 114 (H) 70 - 99 mg/dL  Glucose, capillary     Status: Abnormal   Collection Time: 05/09/19  9:17 PM  Result Value Ref Range   Glucose-Capillary 301 (H) 70 - 99 mg/dL   Comment 1 Notify RN    Comment 2 Document in Chart   CBC with  Differential/Platelet     Status: Abnormal   Collection Time: 05/10/19  6:46 AM  Result Value Ref Range   WBC 8.7 4.0 - 10.5 K/uL   RBC 3.98 (L) 4.22 - 5.81 MIL/uL   Hemoglobin 11.1 (L) 13.0 - 17.0 g/dL   HCT 35.9 (L) 39.0 - 52.0 %   MCV 90.2 80.0 - 100.0 fL   MCH 27.9 26.0 - 34.0 pg   MCHC 30.9 30.0 - 36.0 g/dL   RDW 15.7 (H) 11.5 - 15.5 %   Platelets 269 150 - 400 K/uL   nRBC 0.6 (H) 0.0 - 0.2 %   Neutrophils Relative % 54 %   Neutro Abs 4.8 1.7 - 7.7 K/uL   Lymphocytes Relative 27 %   Lymphs Abs 2.3 0.7 - 4.0 K/uL   Monocytes Relative 11 %   Monocytes Absolute 0.9 0.1 - 1.0 K/uL   Eosinophils Relative 5 %   Eosinophils Absolute 0.4 0.0 - 0.5 K/uL   Basophils Relative 1 %   Basophils Absolute 0.1 0.0 - 0.1 K/uL   Immature Granulocytes 2 %   Abs Immature Granulocytes 0.18 (H) 0.00 - 0.07 K/uL    Comment: Performed at [facility], [address], [place], [place] [address]  Basic metabolic panel     Status: Abnormal   Collection Time: 05/10/19  6:46 AM  Result Value Ref Range   Sodium  135 135 - 145 mmol/L   Potassium 3.5 3.5 - 5.1 mmol/L   Chloride 96 (L) 98 - 111 mmol/L   CO2 29 22 - 32 mmol/L   Glucose, Bld 290 (H) 70 - 99 mg/dL   BUN 23 (H) 6 - 20 mg/dL   Creatinine, Ser 1.18 0.61 - 1.24 mg/dL   Calcium 8.5 (L) 8.9 - 10.3 mg/dL   GFR calc non Af Amer >60 >60 mL/min   GFR calc Af Amer >60 >60 mL/min   Anion gap 10 5 - 15    Comment: Performed at [facility], [address], [place], [place] [address]  Glucose, capillary     Status: Abnormal   Collection Time: 05/10/19  8:09 AM  Result Value Ref Range   Glucose-Capillary 261 (H) 70 - 99 mg/dL  Glucose, capillary     Status: Abnormal   Collection Time: 05/10/19 11:18 AM  Result Value Ref Range   Glucose-Capillary 424 (H) 70 - 99 mg/dL  Glucose, random     Status: Abnormal   Collection Time: 05/10/19 11:58 AM  Result Value Ref Range   Glucose, Bld 409 (H) 70 - 99 mg/dL    Comment: Performed at [facility],  [address], [place], [place] [address]  Glucose, capillary     Status: Abnormal   Collection Time: 05/10/19  5:05 PM  Result Value Ref Range   Glucose-Capillary 287 (H) 70 - 99 mg/dL  Glucose, capillary     Status: Abnormal   Collection Time: 05/10/19  8:17 PM  Result Value Ref Range   Glucose-Capillary 287 (H) 70 - 99 mg/dL   Comment 1 Notify RN   CBC with Differential/Platelet     Status: Abnormal   Collection Time: 05/11/19  6:22 AM  Result Value Ref Range   WBC 9.4 4.0 - 10.5 K/uL   RBC 3.94 (L) 4.22 - 5.81 MIL/uL   Hemoglobin 10.9 (L) 13.0 - 17.0 g/dL   HCT 35.5 (L) 39.0 - 52.0 %   MCV 90.1 80.0 - 100.0 fL   MCH 27.7 26.0 - 34.0 pg   MCHC 30.7 30.0 - 36.0 g/dL   RDW 15.7 (H) 11.5 - 15.5 %   Platelets 258 150 - 400 K/uL   nRBC 1.1 (H) 0.0 - 0.2 %   Neutrophils Relative % 52 %   Neutro Abs 5.0 1.7 - 7.7 K/uL   Lymphocytes Relative 27 %   Lymphs Abs 2.5 0.7 - 4.0 K/uL   Monocytes Relative 13 %   Monocytes Absolute 1.3 (H) 0.1 - 1.0 K/uL   Eosinophils Relative 4 %   Eosinophils Absolute 0.4 0.0 - 0.5 K/uL   Basophils Relative 1 %   Basophils Absolute 0.1 0.0 - 0.1 K/uL   Immature Granulocytes 3 %   Abs Immature Granulocytes 0.27 (H) 0.00 - 0.07 K/uL    Comment: Performed at [facility], [address], [place], [place] [address]  Basic metabolic panel     Status: Abnormal   Collection Time: 05/11/19  6:22 AM  Result Value Ref Range   Sodium 136 135 - 145 mmol/L   Potassium 3.7 3.5 - 5.1 mmol/L   Chloride 96 (L) 98 - 111 mmol/L   CO2 29 22 - 32 mmol/L   Glucose, Bld 232 (H) 70 - 99 mg/dL   BUN 23 (H) 6 - 20 mg/dL   Creatinine, Ser 1.24 0.61 - 1.24 mg/dL   Calcium 8.5 (L) 8.9 - 10.3 mg/dL   GFR calc  non Af Amer >60 >60 mL/min   GFR calc Af Amer >60 >60 mL/min   Anion gap 11 5 - 15    Comment: Performed at [facility], [address] [place], [place] [address]  Glucose, capillary     Status: Abnormal   Collection Time: 05/11/19  7:52 AM  Result Value Ref Range    Glucose-Capillary 212 (H) 70 - 99 mg/dL   Comment 1 Notify RN    Comment 2 Document in Chart     ABGS No results for input(s): PHART, PO2ART, TCO2, HCO3 in the last 72 hours.  Invalid input(s): PCO2 CULTURES Recent Results (from the past 240 hour(s))  SARS CORONAVIRUS 2 (TAT 6-24 HRS) Nasopharyngeal Nasopharyngeal Swab     Status: None   Collection Time: 05/04/19  1:43 AM   Specimen: Nasopharyngeal Swab  Result Value Ref Range Status   SARS Coronavirus 2 NEGATIVE NEGATIVE Final    Comment: (NOTE) SARS-CoV-2 target nucleic acids are NOT DETECTED. The SARS-CoV-2 RNA is generally detectable in upper and lower respiratory specimens during the acute phase of infection. Negative results do not preclude SARS-CoV-2 infection, do not rule out co-infections with other pathogens, and should not be used as the sole basis for treatment or other patient management decisions. Negative results must be combined with clinical observations, patient history, and epidemiological information. The expected result is Negative. Fact Sheet for Patients: SugarRoll.be Fact Sheet for Healthcare Providers: https://www.woods-mathews.com/ This test is not yet approved or cleared by the [place] FDA and  has been authorized for detection and/or diagnosis of SARS-CoV-2 by FDA under an Emergency Use Authorization (EUA). This EUA will remain  in effect (meaning this test can be used) for the duration of the COVID-19 declaration under Section 56 4(b)(1) of the Act, 21 U.S.C. section 360bbb-3(b)(1), unless the authorization is terminated or revoked sooner. Performed at [facility], [place] [address] [place], [place] [address]    Studies/Results: No results found.  Medications:  Prior to Admission:  Medications Prior to Admission  Medication Sig Dispense Refill Last Dose  . albuterol (VENTOLIN HFA) 108 (90 BASE) MCG/ACT inhaler Inhale 2 puffs into the lungs  every 6 (six) hours as needed for wheezing or shortness of breath.     . alfuzosin (UROXATRAL) 10 MG 24 hr tablet Take 10 mg by mouth daily with breakfast.   05/03/2019 at Unknown time  . azithromycin (ZITHROMAX) 500 MG tablet 1 Tab daily per Dr Ivan Anchors medication 30 tablet 5 05/03/2019 at Unknown time  . benzonatate (TESSALON) 200 MG capsule Take 200 mg by mouth 3 (three) times daily.   05/03/2019 at Unknown time  . budesonide (PULMICORT) 0.25 MG/2ML nebulizer solution Take 2 mLs (0.25 mg total) by nebulization 2 (two) times daily. 60 mL 12 05/03/2019 at Unknown time  . CARTIA XT 120 MG 24 hr capsule Take 1 capsule (120 mg total) by mouth daily. 7 capsule 0 05/03/2019 at Unknown time  . diphenhydrAMINE (BENADRYL) 25 mg capsule Take 25 mg by mouth every 8 (eight) hours as needed. Felt mouth swelling at ED visit 08/16/18.     [name] doxycycline (VIBRAMYCIN) 100 MG capsule TK 1 C PO BID   05/03/2019 at Unknown time  . fexofenadine (ALLEGRA) 180 MG tablet Take 180 mg by mouth daily.   05/03/2019 at Unknown time  . fluticasone (FLONASE) 50 MCG/ACT nasal spray Place 2 sprays into both nostrils daily as needed.   5   . Fluticasone-Umeclidin-Vilant (TRELEGY ELLIPTA) 100-62.5-25 MCG/INH AEPB  Inhale 1 puff into the lungs daily.   05/03/2019 at Unknown time  . furosemide (LASIX) 40 MG tablet Take 1 tablet (40 mg total) by mouth daily. As needed for swelling 30 tablet 11 05/03/2019 at Unknown time  . insulin glargine (LANTUS) 100 UNIT/ML injection Inject 0.4 mLs (40 Units total) into the skin daily. 24 units daily 10 mL 2 05/03/2019 at Unknown time  . insulin lispro (HUMALOG) 100 UNIT/ML injection Take by sliding scale provided by hospital maximum amount daily is 60 units.  Check blood sugar 4 times a day (Patient taking differently: Inject 0-15 Units into the skin 3 (three) times daily with meals. Take by sliding scale provided by hospital maximum amount daily is 60 units.  Check blood sugar 4 times a day)  10 mL 11 05/03/2019 at Unknown time  . ipratropium (ATROVENT) 0.02 % nebulizer solution Take 0.5 mg by nebulization 2 (two) times daily.   5 05/03/2019 at Unknown time  . levalbuterol (XOPENEX) 0.63 MG/3ML nebulizer solution Take 0.63 mg by nebulization 2 (two) times daily.   05/03/2019 at Unknown time  . LORazepam (ATIVAN) 0.5 MG tablet Take 1 tablet (0.5 mg total) by mouth 2 (two) times daily as needed for anxiety or sleep. (Patient taking differently: Take 0.5 mg by mouth 4 (four) times daily. Pt takes it three times daily.) 20 tablet 0 05/03/2019 at Unknown time  . magnesium 30 MG tablet Take 30 mg by mouth daily.   05/03/2019 at Unknown time  . metFORMIN (GLUCOPHAGE) 500 MG tablet Take 1 tablet (500 mg total) by mouth daily with breakfast. 30 tablet 3 05/03/2019 at Unknown time  . montelukast (SINGULAIR) 10 MG tablet Take 1 tablet by mouth daily.   05/03/2019 at Unknown time  . nitroGLYCERIN (NITROSTAT) 0.4 MG SL tablet Place 1 tablet (0.4 mg total) under the tongue every 5 (five) minutes as needed for chest pain. 25 tablet 4   . omeprazole (PRILOSEC) 20 MG capsule Take 1 capsule (20 mg total) by mouth 2 (two) times daily before a meal. 180 capsule 3 05/03/2019 at Unknown time  . oxyCODONE-acetaminophen (PERCOCET) 10-325 MG tablet Take 1 tablet by mouth every 4 (four) hours as needed for pain.   05/03/2019 at Unknown time  . potassium chloride SA (K-DUR,KLOR-CON) 20 MEQ tablet Take 1 tablet twice daily while on Lasix 60 tablet 2 05/03/2019 at Unknown time  . predniSONE (DELTASONE) 10 MG tablet Take 2 daily for 1 week then 1 daily (Patient taking differently: Take 20 mg by mouth daily with breakfast. Takes 2 tablets daily.) 60 tablet 5 05/03/2019 at Unknown time  . rifampin (RIFADIN) 300 MG capsule Take 2 capsules (600 mg total) by mouth daily. Long-term medication 60 capsule 11 05/03/2019 at Unknown time  . rosuvastatin (CRESTOR) 10 MG tablet Take 10 mg by mouth every morning.   11 05/03/2019 at  Unknown time  . SYMBICORT 160-4.5 MCG/ACT inhaler INL 2 PFS PO BID   05/03/2019 at Unknown time  . tamsulosin (FLOMAX) 0.4 MG CAPS capsule Take 1 capsule (0.4 mg total) by mouth 2 (two) times daily. 60 capsule 2 05/03/2019 at Unknown time  . acetaminophen (TYLENOL) 325 MG tablet Take 2 tablets (650 mg total) by mouth every 6 (six) hours as needed for mild pain, moderate pain, fever or headache (or Fever >/= 101). (Patient not taking: Reported on 05/04/2019) 12 tablet 1 Not Taking at Unknown time   Scheduled: . azithromycin  500 mg Oral Daily  . benzonatate  200  mg Oral TID  . budesonide  0.25 mg Nebulization BID  . Clofazimine  100 mg Does not apply Daily  . diltiazem  120 mg Oral Daily  . furosemide  80 mg Intravenous Q12H  . guaiFENesin  600 mg Oral BID  . heparin  5,000 Units Subcutaneous Q8H  . insulin aspart  0-20 Units Subcutaneous TID WC  . insulin aspart  0-5 Units Subcutaneous QHS  . insulin aspart  9 Units Subcutaneous TID WC  . insulin glargine  60 Units Subcutaneous Daily  . ipratropium  0.5 mg Nebulization BID  . levalbuterol  1.25 mg Nebulization Q6H  . loratadine  10 mg Oral Daily  . magnesium oxide  200 mg Oral Daily  . metFORMIN  500 mg Oral Q breakfast  . mometasone-formoterol  2 puff Inhalation BID  . pantoprazole  40 mg Oral BID AC  . potassium chloride SA  40 mEq Oral Daily  . predniSONE  40 mg Oral Q breakfast  . rifampin  600 mg Oral Daily  . rosuvastatin  10 mg Oral q morning - 10a  . sodium chloride flush  3 mL Intravenous Q12H  . tamsulosin  0.4 mg Oral BID  . umeclidinium-vilanterol  1 puff Inhalation Daily   Continuous: . sodium chloride    . cefTRIAXone (ROCEPHIN)  IV 1 g (05/10/19 0916)   XTG:GYIRSW chloride, acetaminophen **OR** acetaminophen, diphenhydrAMINE, HYDROcodone-homatropine, ketorolac, levalbuterol, LORazepam, methocarbamol, nitroGLYCERIN, ondansetron **OR** ondansetron (ZOFRAN) IV, oxyCODONE-acetaminophen, polyethylene glycol, sodium  chloride, sodium chloride flush, traZODone  Assesment: He was admitted with congestive heart failure exacerbation and pneumonia.  He has acute on chronic hypoxic respiratory failure.  He is diuresing and getting rid of some of the fluid but he still has significant edema.  He remains on oxygen.  He has COPD at baseline which is pretty severe.  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  Not ready for discharge    LOS: 7 days   Alonza Bogus 05/11/2019, 8:20 AM

## 2019-05-11 NOTE — Progress Notes (Signed)
Inpatient Diabetes Program Recommendations  AACE/ADA: New Consensus Statement on Inpatient Glycemic Control (2015)  Target Ranges:  Prepandial:   less than 140 mg/dL      Peak postprandial:   less than 180 mg/dL (1-2 hours)      Critically ill patients:  140 - 180 mg/dL   Lab Results  Component Value Date   GLUCAP 212 (H) 05/11/2019   HGBA1C 9.3 (H) 05/04/2019    Review of Glycemic Control Results for RAISTLIN, GUM (MRN [id number]) as of 05/11/2019 10:08  Ref. Range 05/10/2019 11:18 05/10/2019 17:05 05/10/2019 20:17 05/11/2019 07:52  Glucose-Capillary Latest Ref Range: 70 - 99 mg/dL 424 (H) 287 (H) 287 (H) 212 (H)   Diabetes history:DM 2 Outpatient Diabetes medications: Lantus 40 units, Humalog 0-15 units tid, Metformin 500 mg Daily  Current orders for Inpatient glycemic control:  Lantus60units Daily Novolog 0-20units tid + hs Novolog 9 units tid meal coverage Metformin 500 mg Daily  Solumedrol transitioned to PO Prednisone 40 mg Daily  Inpatient Diabetes Program Recommendations:    In the setting of steroids, consider increasing Lantus 65 units QD and meal coverage to Novolog 12 units TID (assuming patient continues to consume >50% of meal).  Thanks, Bronson Curb, MSN, RNC-OB Diabetes Coordinator [phone] (8a-5p)

## 2019-05-11 NOTE — Plan of Care (Signed)
  Problem: Education: Goal: Knowledge of General Education information will improve Description Including pain rating scale, medication(s)/side effects and non-pharmacologic comfort measures Outcome: Progressing   Problem: Health Behavior/Discharge Planning: Goal: Ability to manage health-related needs will improve Outcome: Progressing   

## 2019-05-12 LAB — GLUCOSE, CAPILLARY
Glucose-Capillary: 216 mg/dL — ABNORMAL HIGH (ref 70–99)
Glucose-Capillary: 387 mg/dL — ABNORMAL HIGH (ref 70–99)
Glucose-Capillary: 299 mg/dL — ABNORMAL HIGH (ref 70–99)
Glucose-Capillary: 244 mg/dL — ABNORMAL HIGH (ref 70–99)

## 2019-05-12 LAB — TROPONIN I (HIGH SENSITIVITY): Troponin I (High Sensitivity): 3 ng/L (ref <18)

## 2019-05-12 NOTE — Care Management Important Message (Signed)
Important Message  Patient Details  Name: Ryan [name] MRN: [id number] Date of Birth: 1961-06-07   Medicare Important Message Given:  Yes     [name] 05/12/2019, 10:25 AM

## 2019-05-12 NOTE — Progress Notes (Signed)
Pt c/o chest pain, stating it has been sore on his left side since his Echo completed 10/22. VS stable, EKG completed, NSR. MD Kim notified, troponin levels ordered to be taken at 0500. Will continue to monitor patient.

## 2019-05-12 NOTE — Progress Notes (Signed)
Inpatient Diabetes Program Recommendations  AACE/ADA: New Consensus Statement on Inpatient Glycemic Control   Target Ranges:  Prepandial:   less than 140 mg/dL      Peak postprandial:   less than 180 mg/dL (1-2 hours)      Critically ill patients:  140 - 180 mg/dL   Results for KINGSTEN, ENFIELD (MRN [id number]) as of 05/12/2019 11:46  Ref. Range 05/11/2019 07:52 05/11/2019 12:01 05/11/2019 16:10 05/11/2019 21:10 05/12/2019 07:19 05/12/2019 10:53  Glucose-Capillary Latest Ref Range: 70 - 99 mg/dL 212 (H) 410 (H) 337 (H) 174 (H) 216 (H) 387 (H)   Review of Glycemic Control  Diabetes history: DM2 Outpatient Diabetes medications: Lantus 40 units daily, Humalog 0-15 units TID with meals, Metformin 500 mg QAM Current orders for Inpatient glycemic control: Lantus 60 units daily, Novolog 9 units TID with  Meals, Novolog 0-20 units TID with meals, Novolog 0-5 units QHS, Metformin 500 mg QAM; Prednisone 40 mg QAM  Inpatient Diabetes Program Recommendations:   Insulin-Basal: If steroids are continued, please consider increasing Lantus to 65 units daily.  Insulin-Meal Coverage: If steroids are continued, please consider increasing meal coverage to Novolog 12 units TID with meals if patient eats at least 50% of meals.  Thanks, Barnie Alderman, RN, MSN, CDE Diabetes Coordinator Inpatient Diabetes Program [phone] (Team Pager from 8am to 5pm)

## 2019-05-12 NOTE — Progress Notes (Signed)
Subjective: He says he feels better.  He still has significant swelling of his right greater than left leg.  No other new complaints.  His breathing is improved  Objective: Vital signs in last 24 hours: Temp:  [98.2 F (36.8 C)-98.5 F (36.9 C)] 98.5 F (36.9 C) (10/29 0510) Pulse Rate:  [88-107] 88 (10/29 0510) Resp:  [18-22] 18 (10/29 0510) BP: (98-123)/(63-92) 113/63 (10/29 0801) SpO2:  [94 %-100 %] 98 % (10/29 0722) Weight:  [84.5 kg] 84.5 kg (10/29 0500) Weight change: 1 kg Last BM Date: 05/11/19  Intake/Output from previous day: 10/28 0701 - 10/29 0700 In: 486 [P.O.:480; I.V.:6] Out: 1450 [Urine:1450]  PHYSICAL EXAM General appearance: alert, cooperative and no distress Resp: rhonchi bilaterally Cardio: regular rate and rhythm, S1, S2 normal, no murmur, click, rub or gallop GI: soft, non-tender; bowel sounds normal; no masses,  no organomegaly Extremities: extremities normal, atraumatic, no cyanosis or edema Extremities exam is in error.  He actually has 2+ edema of the right leg and 1+ of the left Lab Results:  Results for orders placed or performed during the hospital encounter of 05/04/19 (from the past 48 hour(s))  Glucose, capillary     Status: Abnormal   Collection Time: 05/10/19 11:18 AM  Result Value Ref Range   Glucose-Capillary 424 (H) 70 - 99 mg/dL  Glucose, random     Status: Abnormal   Collection Time: 05/10/19 11:58 AM  Result Value Ref Range   Glucose, Bld 409 (H) 70 - 99 mg/dL    Comment: Performed at [facility], [address], [place], [place] [address]  Glucose, capillary     Status: Abnormal   Collection Time: 05/10/19  5:05 PM  Result Value Ref Range   Glucose-Capillary 287 (H) 70 - 99 mg/dL  Glucose, capillary     Status: Abnormal   Collection Time: 05/10/19  8:17 PM  Result Value Ref Range   Glucose-Capillary 287 (H) 70 - 99 mg/dL   Comment 1 Notify RN   CBC with Differential/Platelet     Status: Abnormal   Collection Time: 05/11/19   6:22 AM  Result Value Ref Range   WBC 9.4 4.0 - 10.5 K/uL   RBC 3.94 (L) 4.22 - 5.81 MIL/uL   Hemoglobin 10.9 (L) 13.0 - 17.0 g/dL   HCT 35.5 (L) 39.0 - 52.0 %   MCV 90.1 80.0 - 100.0 fL   MCH 27.7 26.0 - 34.0 pg   MCHC 30.7 30.0 - 36.0 g/dL   RDW 15.7 (H) 11.5 - 15.5 %   Platelets 258 150 - 400 K/uL   nRBC 1.1 (H) 0.0 - 0.2 %   Neutrophils Relative % 52 %   Neutro Abs 5.0 1.7 - 7.7 K/uL   Lymphocytes Relative 27 %   Lymphs Abs 2.5 0.7 - 4.0 K/uL   Monocytes Relative 13 %   Monocytes Absolute 1.3 (H) 0.1 - 1.0 K/uL   Eosinophils Relative 4 %   Eosinophils Absolute 0.4 0.0 - 0.5 K/uL   Basophils Relative 1 %   Basophils Absolute 0.1 0.0 - 0.1 K/uL   Immature Granulocytes 3 %   Abs Immature Granulocytes 0.27 (H) 0.00 - 0.07 K/uL    Comment: Performed at [facility], [address] [place], [place] [address]  Basic metabolic panel     Status: Abnormal   Collection Time: 05/11/19  6:22 AM  Result Value Ref Range   Sodium 136 135 - 145 mmol/L   Potassium 3.7 3.5 - 5.1 mmol/L  Chloride 96 (L) 98 - 111 mmol/L   CO2 29 22 - 32 mmol/L   Glucose, Bld 232 (H) 70 - 99 mg/dL   BUN 23 (H) 6 - 20 mg/dL   Creatinine, Ser 1.24 0.61 - 1.24 mg/dL   Calcium 8.5 (L) 8.9 - 10.3 mg/dL   GFR calc non Af Amer >60 >60 mL/min   GFR calc Af Amer >60 >60 mL/min   Anion gap 11 5 - 15    Comment: Performed at [facility], [address], [place], [place] [address]  Glucose, capillary     Status: Abnormal   Collection Time: 05/11/19  7:52 AM  Result Value Ref Range   Glucose-Capillary 212 (H) 70 - 99 mg/dL   Comment 1 Notify RN    Comment 2 Document in Chart   Glucose, capillary     Status: Abnormal   Collection Time: 05/11/19 12:01 PM  Result Value Ref Range   Glucose-Capillary 410 (H) 70 - 99 mg/dL   Comment 1 Notify RN    Comment 2 Document in Chart   Glucose, capillary     Status: Abnormal   Collection Time: 05/11/19  4:10 PM  Result Value Ref Range   Glucose-Capillary 337 (H) 70 - 99  mg/dL  Glucose, capillary     Status: Abnormal   Collection Time: 05/11/19  9:10 PM  Result Value Ref Range   Glucose-Capillary 174 (H) 70 - 99 mg/dL   Comment 1 Notify RN   Troponin I (High Sensitivity)     Status: None   Collection Time: 05/12/19  5:13 AM  Result Value Ref Range   Troponin I (High Sensitivity) 3 <18 ng/L    Comment: (NOTE) Elevated high sensitivity troponin I (hsTnI) values and significant  changes across serial measurements may suggest ACS but many other  chronic and acute conditions are known to elevate hsTnI results.  Refer to the "Links" section for chest pain algorithms and additional  guidance. Performed at [facility], [address] [place], [place] [address]   Glucose, capillary     Status: Abnormal   Collection Time: 05/12/19  7:19 AM  Result Value Ref Range   Glucose-Capillary 216 (H) 70 - 99 mg/dL    ABGS No results for input(s): PHART, PO2ART, TCO2, HCO3 in the last 72 hours.  Invalid input(s): PCO2 CULTURES Recent Results (from the past 240 hour(s))  SARS CORONAVIRUS 2 (TAT 6-24 HRS) Nasopharyngeal Nasopharyngeal Swab     Status: None   Collection Time: 05/04/19  1:43 AM   Specimen: Nasopharyngeal Swab  Result Value Ref Range Status   SARS Coronavirus 2 NEGATIVE NEGATIVE Final    Comment: (NOTE) SARS-CoV-2 target nucleic acids are NOT DETECTED. The SARS-CoV-2 RNA is generally detectable in upper and lower respiratory specimens during the acute phase of infection. Negative results do not preclude SARS-CoV-2 infection, do not rule out co-infections with other pathogens, and should not be used as the sole basis for treatment or other patient management decisions. Negative results must be combined with clinical observations, patient history, and epidemiological information. The expected result is Negative. Fact Sheet for Patients: SugarRoll.be Fact Sheet for Healthcare  Providers: https://www.woods-mathews.com/ This test is not yet approved or cleared by the [place] FDA and  has been authorized for detection and/or diagnosis of SARS-CoV-2 by FDA under an Emergency Use Authorization (EUA). This EUA will remain  in effect (meaning this test can be used) for the duration of the COVID-19 declaration under Section 56 4(b)(1)  of the Act, 21 U.S.C. section 360bbb-3(b)(1), unless the authorization is terminated or revoked sooner. Performed at [facility], [place] [address], [place], [place] [address]    Studies/Results: No results found.  Medications:  Prior to Admission:  Medications Prior to Admission  Medication Sig Dispense Refill Last Dose  . albuterol (VENTOLIN HFA) 108 (90 BASE) MCG/ACT inhaler Inhale 2 puffs into the lungs every 6 (six) hours as needed for wheezing or shortness of breath.     . alfuzosin (UROXATRAL) 10 MG 24 hr tablet Take 10 mg by mouth daily with breakfast.   05/03/2019 at Unknown time  . azithromycin (ZITHROMAX) 500 MG tablet 1 Tab daily per Dr Ivan Anchors medication 30 tablet 5 05/03/2019 at Unknown time  . benzonatate (TESSALON) 200 MG capsule Take 200 mg by mouth 3 (three) times daily.   05/03/2019 at Unknown time  . budesonide (PULMICORT) 0.25 MG/2ML nebulizer solution Take 2 mLs (0.25 mg total) by nebulization 2 (two) times daily. 60 mL 12 05/03/2019 at Unknown time  . CARTIA XT 120 MG 24 hr capsule Take 1 capsule (120 mg total) by mouth daily. 7 capsule 0 05/03/2019 at Unknown time  . diphenhydrAMINE (BENADRYL) 25 mg capsule Take 25 mg by mouth every 8 (eight) hours as needed. Felt mouth swelling at ED visit 08/16/18.     [name] doxycycline (VIBRAMYCIN) 100 MG capsule TK 1 C PO BID   05/03/2019 at Unknown time  . fexofenadine (ALLEGRA) 180 MG tablet Take 180 mg by mouth daily.   05/03/2019 at Unknown time  . fluticasone (FLONASE) 50 MCG/ACT nasal spray Place 2 sprays into both nostrils daily as needed.    5   . Fluticasone-Umeclidin-Vilant (TRELEGY ELLIPTA) 100-62.5-25 MCG/INH AEPB Inhale 1 puff into the lungs daily.   05/03/2019 at Unknown time  . furosemide (LASIX) 40 MG tablet Take 1 tablet (40 mg total) by mouth daily. As needed for swelling 30 tablet 11 05/03/2019 at Unknown time  . insulin glargine (LANTUS) 100 UNIT/ML injection Inject 0.4 mLs (40 Units total) into the skin daily. 24 units daily 10 mL 2 05/03/2019 at Unknown time  . insulin lispro (HUMALOG) 100 UNIT/ML injection Take by sliding scale provided by hospital maximum amount daily is 60 units.  Check blood sugar 4 times a day (Patient taking differently: Inject 0-15 Units into the skin 3 (three) times daily with meals. Take by sliding scale provided by hospital maximum amount daily is 60 units.  Check blood sugar 4 times a day) 10 mL 11 05/03/2019 at Unknown time  . ipratropium (ATROVENT) 0.02 % nebulizer solution Take 0.5 mg by nebulization 2 (two) times daily.   5 05/03/2019 at Unknown time  . levalbuterol (XOPENEX) 0.63 MG/3ML nebulizer solution Take 0.63 mg by nebulization 2 (two) times daily.   05/03/2019 at Unknown time  . LORazepam (ATIVAN) 0.5 MG tablet Take 1 tablet (0.5 mg total) by mouth 2 (two) times daily as needed for anxiety or sleep. (Patient taking differently: Take 0.5 mg by mouth 4 (four) times daily. Pt takes it three times daily.) 20 tablet 0 05/03/2019 at Unknown time  . magnesium 30 MG tablet Take 30 mg by mouth daily.   05/03/2019 at Unknown time  . metFORMIN (GLUCOPHAGE) 500 MG tablet Take 1 tablet (500 mg total) by mouth daily with breakfast. 30 tablet 3 05/03/2019 at Unknown time  . montelukast (SINGULAIR) 10 MG tablet Take 1 tablet by mouth daily.   05/03/2019 at Unknown time  . nitroGLYCERIN (NITROSTAT)  0.4 MG SL tablet Place 1 tablet (0.4 mg total) under the tongue every 5 (five) minutes as needed for chest pain. 25 tablet 4   . omeprazole (PRILOSEC) 20 MG capsule Take 1 capsule (20 mg total) by mouth 2 (two)  times daily before a meal. 180 capsule 3 05/03/2019 at Unknown time  . oxyCODONE-acetaminophen (PERCOCET) 10-325 MG tablet Take 1 tablet by mouth every 4 (four) hours as needed for pain.   05/03/2019 at Unknown time  . potassium chloride SA (K-DUR,KLOR-CON) 20 MEQ tablet Take 1 tablet twice daily while on Lasix 60 tablet 2 05/03/2019 at Unknown time  . predniSONE (DELTASONE) 10 MG tablet Take 2 daily for 1 week then 1 daily (Patient taking differently: Take 20 mg by mouth daily with breakfast. Takes 2 tablets daily.) 60 tablet 5 05/03/2019 at Unknown time  . rifampin (RIFADIN) 300 MG capsule Take 2 capsules (600 mg total) by mouth daily. Long-term medication 60 capsule 11 05/03/2019 at Unknown time  . rosuvastatin (CRESTOR) 10 MG tablet Take 10 mg by mouth every morning.   11 05/03/2019 at Unknown time  . SYMBICORT 160-4.5 MCG/ACT inhaler INL 2 PFS PO BID   05/03/2019 at Unknown time  . tamsulosin (FLOMAX) 0.4 MG CAPS capsule Take 1 capsule (0.4 mg total) by mouth 2 (two) times daily. 60 capsule 2 05/03/2019 at Unknown time  . acetaminophen (TYLENOL) 325 MG tablet Take 2 tablets (650 mg total) by mouth every 6 (six) hours as needed for mild pain, moderate pain, fever or headache (or Fever >/= 101). (Patient not taking: Reported on 05/04/2019) 12 tablet 1 Not Taking at Unknown time   Scheduled: . azithromycin  500 mg Oral Daily  . benzonatate  200 mg Oral TID  . budesonide  0.25 mg Nebulization BID  . Clofazimine  100 mg Does not apply Daily  . diltiazem  120 mg Oral Daily  . feeding supplement (ENSURE ENLIVE)  237 mL Oral BID BM  . furosemide  80 mg Intravenous Q12H  . guaiFENesin  600 mg Oral BID  . heparin  5,000 Units Subcutaneous Q8H  . insulin aspart  0-20 Units Subcutaneous TID WC  . insulin aspart  0-5 Units Subcutaneous QHS  . insulin aspart  9 Units Subcutaneous TID WC  . insulin glargine  60 Units Subcutaneous Daily  . ipratropium  0.5 mg Nebulization BID  . levalbuterol  1.25 mg  Nebulization Q6H  . loratadine  10 mg Oral Daily  . magnesium oxide  200 mg Oral Daily  . metFORMIN  500 mg Oral Q breakfast  . mometasone-formoterol  2 puff Inhalation BID  . pantoprazole  40 mg Oral BID AC  . potassium chloride SA  40 mEq Oral Daily  . predniSONE  40 mg Oral Q breakfast  . rifampin  600 mg Oral Daily  . rosuvastatin  10 mg Oral q morning - 10a  . sodium chloride flush  3 mL Intravenous Q12H  . tamsulosin  0.4 mg Oral BID  . umeclidinium-vilanterol  1 puff Inhalation Daily   Continuous: . sodium chloride    . cefTRIAXone (ROCEPHIN)  IV 1 g (05/11/19 0835)   PQD:IYMEBR chloride, acetaminophen **OR** acetaminophen, diphenhydrAMINE, HYDROcodone-homatropine, ketorolac, levalbuterol, LORazepam, methocarbamol, nitroGLYCERIN, ondansetron **OR** ondansetron (ZOFRAN) IV, oxyCODONE-acetaminophen, polyethylene glycol, sodium chloride, sodium chloride flush, traZODone  Assesment: He was admitted with acute exacerbation of CHF.  He is improving but still has significant swelling of his legs.  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

## 2019-05-13 LAB — BASIC METABOLIC PANEL
Anion gap: 12 (ref 5–15)
BUN: 24 mg/dL — ABNORMAL HIGH (ref 6–20)
CO2: 29 mmol/L (ref 22–32)
Calcium: 8.6 mg/dL — ABNORMAL LOW (ref 8.9–10.3)
Chloride: 94 mmol/L — ABNORMAL LOW (ref 98–111)
Creatinine, Ser: 1.16 mg/dL (ref 0.61–1.24)
GFR calc Af Amer: >60 mL/min (ref >60)
GFR calc non Af Amer: >60 mL/min (ref >60)
Glucose, Bld: 240 mg/dL — ABNORMAL HIGH (ref 70–99)
Potassium: 3.6 mmol/L (ref 3.5–5.1)
Sodium: 135 mmol/L (ref 135–145)

## 2019-05-13 LAB — GLUCOSE, CAPILLARY
Glucose-Capillary: 213 mg/dL — ABNORMAL HIGH (ref 70–99)
Glucose-Capillary: 350 mg/dL — ABNORMAL HIGH (ref 70–99)
Glucose-Capillary: 325 mg/dL — ABNORMAL HIGH (ref 70–99)
Glucose-Capillary: 200 mg/dL — ABNORMAL HIGH (ref 70–99)

## 2019-05-13 LAB — CBC WITH DIFFERENTIAL/PLATELET
Abs Immature Granulocytes: 0.29 10*3/uL — ABNORMAL HIGH (ref 0.00–0.07)
Basophils Absolute: 0 10*3/uL (ref 0.0–0.1)
Basophils Relative: 0 %
Eosinophils Absolute: 0.6 10*3/uL — ABNORMAL HIGH (ref 0.0–0.5)
Eosinophils Relative: 6 %
HCT: 35.9 % — ABNORMAL LOW (ref 39.0–52.0)
Hemoglobin: 11.2 g/dL — ABNORMAL LOW (ref 13.0–17.0)
Immature Granulocytes: 3 %
Lymphocytes Relative: 22 %
Lymphs Abs: 2 10*3/uL (ref 0.7–4.0)
MCH: 28.4 pg (ref 26.0–34.0)
MCHC: 31.2 g/dL (ref 30.0–36.0)
MCV: 90.9 fL (ref 80.0–100.0)
Monocytes Absolute: 1.1 10*3/uL — ABNORMAL HIGH (ref 0.1–1.0)
Monocytes Relative: 12 %
Neutro Abs: 5.1 10*3/uL (ref 1.7–7.7)
Neutrophils Relative %: 57 %
Platelets: 253 10*3/uL (ref 150–400)
RBC: 3.95 MIL/uL — ABNORMAL LOW (ref 4.22–5.81)
RDW: 15.7 % — ABNORMAL HIGH (ref 11.5–15.5)
WBC: 9 10*3/uL (ref 4.0–10.5)
nRBC: 1 % — ABNORMAL HIGH (ref 0.0–0.2)

## 2019-05-13 NOTE — Progress Notes (Signed)
Patient refused ECG. RN notified.

## 2019-05-13 NOTE — Care Management Important Message (Signed)
Important Message  Patient Details  Name: Ryan [name] MRN: [id number] Date of Birth: 08-Aug-1960   Medicare Important Message Given:  Yes     [name] 05/13/2019, 12:19 PM

## 2019-05-13 NOTE — Progress Notes (Signed)
Subjective: Events noted.  He has apologized.  He says he feels very anxious.  He still has swelling of his legs  Objective: Vital signs in last 24 hours: Temp:  [97.9 F (36.6 C)-98.4 F (36.9 C)] 97.9 F (36.6 C) (10/30 4656) Pulse Rate:  [97-107] 97 (10/30 0613) Resp:  [18-19] 18 (10/30 0613) BP: (97-125)/(63-81) 97/80 (10/30 0613) SpO2:  [86 %-96 %] 93 % (10/30 0735) Weight change:  Last BM Date: 05/12/19  Intake/Output from previous day: 10/29 0701 - 10/30 0700 In: 726 [P.O.:720; I.V.:6] Out: 1300 [Urine:1300]  PHYSICAL EXAM General appearance: alert and no distress Resp: clear to auscultation bilaterally Cardio: regular rate and rhythm, S1, S2 normal, no murmur, click, rub or gallop GI: Still 2-3+ edema right leg 1+ on the left. Extremities: Still 2-3+ edema right leg and 1+ on the left  Lab Results:  Results for orders placed or performed during the hospital encounter of 05/04/19 (from the past 48 hour(s))  Glucose, capillary     Status: Abnormal   Collection Time: 05/11/19  7:52 AM  Result Value Ref Range   Glucose-Capillary 212 (H) 70 - 99 mg/dL   Comment 1 Notify RN    Comment 2 Document in Chart   Glucose, capillary     Status: Abnormal   Collection Time: 05/11/19 12:01 PM  Result Value Ref Range   Glucose-Capillary 410 (H) 70 - 99 mg/dL   Comment 1 Notify RN    Comment 2 Document in Chart   Glucose, capillary     Status: Abnormal   Collection Time: 05/11/19  4:10 PM  Result Value Ref Range   Glucose-Capillary 337 (H) 70 - 99 mg/dL  Glucose, capillary     Status: Abnormal   Collection Time: 05/11/19  9:10 PM  Result Value Ref Range   Glucose-Capillary 174 (H) 70 - 99 mg/dL   Comment 1 Notify RN   Troponin I (High Sensitivity)     Status: None   Collection Time: 05/12/19  5:13 AM  Result Value Ref Range   Troponin I (High Sensitivity) 3 <18 ng/L    Comment: (NOTE) Elevated high sensitivity troponin I (hsTnI) values and significant  changes across  serial measurements may suggest ACS but many other  chronic and acute conditions are known to elevate hsTnI results.  Refer to the "Links" section for chest pain algorithms and additional  guidance. Performed at [facility], [address], [place], [place] [address]   Glucose, capillary     Status: Abnormal   Collection Time: 05/12/19  7:19 AM  Result Value Ref Range   Glucose-Capillary 216 (H) 70 - 99 mg/dL  Glucose, capillary     Status: Abnormal   Collection Time: 05/12/19 10:53 AM  Result Value Ref Range   Glucose-Capillary 387 (H) 70 - 99 mg/dL  Glucose, capillary     Status: Abnormal   Collection Time: 05/12/19  4:02 PM  Result Value Ref Range   Glucose-Capillary 299 (H) 70 - 99 mg/dL  Glucose, capillary     Status: Abnormal   Collection Time: 05/12/19  9:38 PM  Result Value Ref Range   Glucose-Capillary 244 (H) 70 - 99 mg/dL   Comment 1 Notify RN   CBC with Differential/Platelet     Status: Abnormal   Collection Time: 05/13/19  6:34 AM  Result Value Ref Range   WBC 9.0 4.0 - 10.5 K/uL   RBC 3.95 (L) 4.22 - 5.81 MIL/uL   Hemoglobin 11.2 (L) 13.0 - 17.0  g/dL   HCT 35.9 (L) 39.0 - 52.0 %   MCV 90.9 80.0 - 100.0 fL   MCH 28.4 26.0 - 34.0 pg   MCHC 31.2 30.0 - 36.0 g/dL   RDW 15.7 (H) 11.5 - 15.5 %   Platelets 253 150 - 400 K/uL   nRBC 1.0 (H) 0.0 - 0.2 %   Neutrophils Relative % 57 %   Neutro Abs 5.1 1.7 - 7.7 K/uL   Lymphocytes Relative 22 %   Lymphs Abs 2.0 0.7 - 4.0 K/uL   Monocytes Relative 12 %   Monocytes Absolute 1.1 (H) 0.1 - 1.0 K/uL   Eosinophils Relative 6 %   Eosinophils Absolute 0.6 (H) 0.0 - 0.5 K/uL   Basophils Relative 0 %   Basophils Absolute 0.0 0.0 - 0.1 K/uL   Immature Granulocytes 3 %   Abs Immature Granulocytes 0.29 (H) 0.00 - 0.07 K/uL    Comment: Performed at [facility], [address], [place], [place] [address]  Basic metabolic panel     Status: Abnormal   Collection Time: 05/13/19  6:34 AM  Result Value Ref Range   Sodium 135 135 -  145 mmol/L   Potassium 3.6 3.5 - 5.1 mmol/L   Chloride 94 (L) 98 - 111 mmol/L   CO2 29 22 - 32 mmol/L   Glucose, Bld 240 (H) 70 - 99 mg/dL   BUN 24 (H) 6 - 20 mg/dL   Creatinine, Ser 1.16 0.61 - 1.24 mg/dL   Calcium 8.6 (L) 8.9 - 10.3 mg/dL   GFR calc non Af Amer >60 >60 mL/min   GFR calc Af Amer >60 >60 mL/min   Anion gap 12 5 - 15    Comment: Performed at [facility], [address] [place], [place] [address]  Glucose, capillary     Status: Abnormal   Collection Time: 05/13/19  7:25 AM  Result Value Ref Range   Glucose-Capillary 213 (H) 70 - 99 mg/dL    ABGS No results for input(s): PHART, PO2ART, TCO2, HCO3 in the last 72 hours.  Invalid input(s): PCO2 CULTURES Recent Results (from the past 240 hour(s))  SARS CORONAVIRUS 2 (TAT 6-24 HRS) Nasopharyngeal Nasopharyngeal Swab     Status: None   Collection Time: 05/04/19  1:43 AM   Specimen: Nasopharyngeal Swab  Result Value Ref Range Status   SARS Coronavirus 2 NEGATIVE NEGATIVE Final    Comment: (NOTE) SARS-CoV-2 target nucleic acids are NOT DETECTED. The SARS-CoV-2 RNA is generally detectable in upper and lower respiratory specimens during the acute phase of infection. Negative results do not preclude SARS-CoV-2 infection, do not rule out co-infections with other pathogens, and should not be used as the sole basis for treatment or other patient management decisions. Negative results must be combined with clinical observations, patient history, and epidemiological information. The expected result is Negative. Fact Sheet for Patients: SugarRoll.be Fact Sheet for Healthcare Providers: https://www.woods-mathews.com/ This test is not yet approved or cleared by the [place] FDA and  has been authorized for detection and/or diagnosis of SARS-CoV-2 by FDA under an Emergency Use Authorization (EUA). This EUA will remain  in effect (meaning this test can be used) for the duration of  the COVID-19 declaration under Section 56 4(b)(1) of the Act, 21 U.S.C. section 360bbb-3(b)(1), unless the authorization is terminated or revoked sooner. Performed at [facility], [place] [address], [place], [place] [address]    Studies/Results: No results found.  Medications:  Prior to Admission:  Medications Prior  to Admission  Medication Sig Dispense Refill Last Dose  . albuterol (VENTOLIN HFA) 108 (90 BASE) MCG/ACT inhaler Inhale 2 puffs into the lungs every 6 (six) hours as needed for wheezing or shortness of breath.     . alfuzosin (UROXATRAL) 10 MG 24 hr tablet Take 10 mg by mouth daily with breakfast.   05/03/2019 at Unknown time  . azithromycin (ZITHROMAX) 500 MG tablet 1 Tab daily per Dr Ivan Anchors medication 30 tablet 5 05/03/2019 at Unknown time  . benzonatate (TESSALON) 200 MG capsule Take 200 mg by mouth 3 (three) times daily.   05/03/2019 at Unknown time  . budesonide (PULMICORT) 0.25 MG/2ML nebulizer solution Take 2 mLs (0.25 mg total) by nebulization 2 (two) times daily. 60 mL 12 05/03/2019 at Unknown time  . CARTIA XT 120 MG 24 hr capsule Take 1 capsule (120 mg total) by mouth daily. 7 capsule 0 05/03/2019 at Unknown time  . diphenhydrAMINE (BENADRYL) 25 mg capsule Take 25 mg by mouth every 8 (eight) hours as needed. Felt mouth swelling at ED visit 08/16/18.     [name] doxycycline (VIBRAMYCIN) 100 MG capsule TK 1 C PO BID   05/03/2019 at Unknown time  . fexofenadine (ALLEGRA) 180 MG tablet Take 180 mg by mouth daily.   05/03/2019 at Unknown time  . fluticasone (FLONASE) 50 MCG/ACT nasal spray Place 2 sprays into both nostrils daily as needed.   5   . Fluticasone-Umeclidin-Vilant (TRELEGY ELLIPTA) 100-62.5-25 MCG/INH AEPB Inhale 1 puff into the lungs daily.   05/03/2019 at Unknown time  . furosemide (LASIX) 40 MG tablet Take 1 tablet (40 mg total) by mouth daily. As needed for swelling 30 tablet 11 05/03/2019 at Unknown time  . insulin glargine (LANTUS) 100 UNIT/ML  injection Inject 0.4 mLs (40 Units total) into the skin daily. 24 units daily 10 mL 2 05/03/2019 at Unknown time  . insulin lispro (HUMALOG) 100 UNIT/ML injection Take by sliding scale provided by hospital maximum amount daily is 60 units.  Check blood sugar 4 times a day (Patient taking differently: Inject 0-15 Units into the skin 3 (three) times daily with meals. Take by sliding scale provided by hospital maximum amount daily is 60 units.  Check blood sugar 4 times a day) 10 mL 11 05/03/2019 at Unknown time  . ipratropium (ATROVENT) 0.02 % nebulizer solution Take 0.5 mg by nebulization 2 (two) times daily.   5 05/03/2019 at Unknown time  . levalbuterol (XOPENEX) 0.63 MG/3ML nebulizer solution Take 0.63 mg by nebulization 2 (two) times daily.   05/03/2019 at Unknown time  . LORazepam (ATIVAN) 0.5 MG tablet Take 1 tablet (0.5 mg total) by mouth 2 (two) times daily as needed for anxiety or sleep. (Patient taking differently: Take 0.5 mg by mouth 4 (four) times daily. Pt takes it three times daily.) 20 tablet 0 05/03/2019 at Unknown time  . magnesium 30 MG tablet Take 30 mg by mouth daily.   05/03/2019 at Unknown time  . metFORMIN (GLUCOPHAGE) 500 MG tablet Take 1 tablet (500 mg total) by mouth daily with breakfast. 30 tablet 3 05/03/2019 at Unknown time  . montelukast (SINGULAIR) 10 MG tablet Take 1 tablet by mouth daily.   05/03/2019 at Unknown time  . nitroGLYCERIN (NITROSTAT) 0.4 MG SL tablet Place 1 tablet (0.4 mg total) under the tongue every 5 (five) minutes as needed for chest pain. 25 tablet 4   . omeprazole (PRILOSEC) 20 MG capsule Take 1 capsule (20 mg total) by mouth 2 (two) times  daily before a meal. 180 capsule 3 05/03/2019 at Unknown time  . oxyCODONE-acetaminophen (PERCOCET) 10-325 MG tablet Take 1 tablet by mouth every 4 (four) hours as needed for pain.   05/03/2019 at Unknown time  . potassium chloride SA (K-DUR,KLOR-CON) 20 MEQ tablet Take 1 tablet twice daily while on Lasix 60 tablet 2  05/03/2019 at Unknown time  . predniSONE (DELTASONE) 10 MG tablet Take 2 daily for 1 week then 1 daily (Patient taking differently: Take 20 mg by mouth daily with breakfast. Takes 2 tablets daily.) 60 tablet 5 05/03/2019 at Unknown time  . rifampin (RIFADIN) 300 MG capsule Take 2 capsules (600 mg total) by mouth daily. Long-term medication 60 capsule 11 05/03/2019 at Unknown time  . rosuvastatin (CRESTOR) 10 MG tablet Take 10 mg by mouth every morning.   11 05/03/2019 at Unknown time  . SYMBICORT 160-4.5 MCG/ACT inhaler INL 2 PFS PO BID   05/03/2019 at Unknown time  . tamsulosin (FLOMAX) 0.4 MG CAPS capsule Take 1 capsule (0.4 mg total) by mouth 2 (two) times daily. 60 capsule 2 05/03/2019 at Unknown time  . acetaminophen (TYLENOL) 325 MG tablet Take 2 tablets (650 mg total) by mouth every 6 (six) hours as needed for mild pain, moderate pain, fever or headache (or Fever >/= 101). (Patient not taking: Reported on 05/04/2019) 12 tablet 1 Not Taking at Unknown time   Scheduled: . azithromycin  500 mg Oral Daily  . benzonatate  200 mg Oral TID  . budesonide  0.25 mg Nebulization BID  . Clofazimine  100 mg Does not apply Daily  . diltiazem  120 mg Oral Daily  . feeding supplement (ENSURE ENLIVE)  237 mL Oral BID BM  . furosemide  80 mg Intravenous Q12H  . guaiFENesin  600 mg Oral BID  . heparin  5,000 Units Subcutaneous Q8H  . insulin aspart  0-20 Units Subcutaneous TID WC  . insulin aspart  0-5 Units Subcutaneous QHS  . insulin aspart  9 Units Subcutaneous TID WC  . insulin glargine  60 Units Subcutaneous Daily  . ipratropium  0.5 mg Nebulization BID  . levalbuterol  1.25 mg Nebulization Q6H  . loratadine  10 mg Oral Daily  . magnesium oxide  200 mg Oral Daily  . metFORMIN  500 mg Oral Q breakfast  . mometasone-formoterol  2 puff Inhalation BID  . pantoprazole  40 mg Oral BID AC  . potassium chloride SA  40 mEq Oral Daily  . predniSONE  40 mg Oral Q breakfast  . rifampin  600 mg Oral Daily   . rosuvastatin  10 mg Oral q morning - 10a  . sodium chloride flush  3 mL Intravenous Q12H  . tamsulosin  0.4 mg Oral BID  . umeclidinium-vilanterol  1 puff Inhalation Daily   Continuous: . sodium chloride     GMW:NUUVOZ chloride, acetaminophen **OR** acetaminophen, diphenhydrAMINE, HYDROcodone-homatropine, ketorolac, levalbuterol, LORazepam, methocarbamol, nitroGLYCERIN, ondansetron **OR** ondansetron (ZOFRAN) IV, oxyCODONE-acetaminophen, polyethylene glycol, sodium chloride, sodium chloride flush, traZODone  Assesment: He was admitted with acute exacerbation of CHF.  He still has significant swelling of his legs.  Some of this may be venous stasis.  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  Continue Ace wraps    LOS: 9 days   Ryan [name] 05/13/2019, 7:48 AM

## 2019-05-13 NOTE — Progress Notes (Signed)
Inpatient Diabetes Program Recommendations  AACE/ADA: New Consensus Statement on Inpatient Glycemic Control (2015)  Target Ranges:  Prepandial:   less than 140 mg/dL      Peak postprandial:   less than 180 mg/dL (1-2 hours)      Critically ill patients:  140 - 180 mg/dL   Lab Results  Component Value Date   GLUCAP 350 (H) 05/13/2019   HGBA1C 9.3 (H) 05/04/2019    Review of Glycemic Control Results for ARSHIA, RONDON (MRN [id number]) as of 05/13/2019 12:27  Ref. Range 05/12/2019 16:02 05/12/2019 21:38 05/13/2019 07:25 05/13/2019 10:56  Glucose-Capillary Latest Ref Range: 70 - 99 mg/dL 299 (H) 244 (H) 213 (H) 350 (H)  Diabetes history: DM2 Outpatient Diabetes medications: Lantus 40 units daily, Humalog 0-15 units TID with meals, Metformin 500 mg QAM Current orders for Inpatient glycemic control: Lantus 60 units daily, Novolog 9 units TID with  Meals, Novolog 0-20 units TID with meals, Novolog 0-5 units QHS, Metformin 500 mg QAM; Prednisone 40 mg QAM Inpatient Diabetes Program Recommendations:    Please increase Lantus to 65 units daily and increase Novolog to 12 units tid with meals.   Thanks  Adah Perl, RN, BC-ADM Inpatient Diabetes Coordinator Pager [phone] (8a-5p)

## 2019-05-13 NOTE — Progress Notes (Signed)
Pt called regarding chest pain. When entering room, pt became explosive, demanding I increase his O2 to 10L, that if I did not, I would force him into Afib. He then demanded more pain medicine than I could give per MD orders/MAR while stating, "if you dont give me my pain medicine I will call your nursing supervisor". I told him per chest pain protocol, an EKG will be obtained as well as VS. When the RT arrived, he adamantly refused the EKG and VS to be taken. After providing education of why it was needed, he still refused. Shortly after, pt expressed an apology, attempting to rationalize his actions and behavior. Will continue to monitor pt.

## 2019-05-14 LAB — GLUCOSE, CAPILLARY
Glucose-Capillary: 270 mg/dL — ABNORMAL HIGH (ref 70–99)
Glucose-Capillary: 362 mg/dL — ABNORMAL HIGH (ref 70–99)
Glucose-Capillary: 187 mg/dL — ABNORMAL HIGH (ref 70–99)

## 2019-05-14 MED ORDER — INSULIN GLARGINE 100 UNIT/ML ~~LOC~~ SOLN
65.0000 [IU] | Freq: Every day | SUBCUTANEOUS | Status: DC
  Administered 2019-05-14 – 2019-05-29 (×16): 65 [IU] via SUBCUTANEOUS
  Filled 2019-05-14 (×17): qty 0.65

## 2019-05-14 MED ORDER — INSULIN ASPART 100 UNIT/ML ~~LOC~~ SOLN
12.0000 [IU] | Freq: Three times a day (TID) | SUBCUTANEOUS | Status: DC
  Administered 2019-05-14 – 2019-05-17 (×10): 12 [IU] via SUBCUTANEOUS

## 2019-05-14 NOTE — Progress Notes (Signed)
Subjective: He says he feels better.  He still has swelling of his right greater than left leg.  No other new complaints.  His breathing is improving.  His blood sugar has still been up and I have adjusted his insulin again  Objective: Vital signs in last 24 hours: Temp:  [98.1 F (36.7 C)-98.2 F (36.8 C)] 98.2 F (36.8 C) (10/31 0530) Pulse Rate:  [108-119] 108 (10/31 0530) Resp:  [22] 22 (10/31 0530) BP: (120-131)/(70-76) 120/70 (10/31 0530) SpO2:  [91 %-99 %] 91 % (10/31 0723) Weight:  [84.1 kg] 84.1 kg (10/31 0530) Weight change:  Last BM Date: 05/13/19  Intake/Output from previous day: 10/30 0701 - 10/31 0700 In: 846 [P.O.:840; I.V.:6] Out: 1250 [Urine:1250]  PHYSICAL EXAM General appearance: alert, cooperative and no distress Resp: clear to auscultation bilaterally Cardio: regular rate and rhythm, S1, S2 normal, no murmur, click, rub or gallop GI: He has some tenderness in the right lower quadrant area but it looks like it is a  hematoma from his Lovenox injections Extremities: His left leg edema is almost totally resolved the right is still present  Lab Results:  Results for orders placed or performed during the hospital encounter of 05/04/19 (from the past 48 hour(s))  Glucose, capillary     Status: Abnormal   Collection Time: 05/12/19 10:53 AM  Result Value Ref Range   Glucose-Capillary 387 (H) 70 - 99 mg/dL  Glucose, capillary     Status: Abnormal   Collection Time: 05/12/19  4:02 PM  Result Value Ref Range   Glucose-Capillary 299 (H) 70 - 99 mg/dL  Glucose, capillary     Status: Abnormal   Collection Time: 05/12/19  9:38 PM  Result Value Ref Range   Glucose-Capillary 244 (H) 70 - 99 mg/dL   Comment 1 Notify RN   CBC with Differential/Platelet     Status: Abnormal   Collection Time: 05/13/19  6:34 AM  Result Value Ref Range   WBC 9.0 4.0 - 10.5 K/uL   RBC 3.95 (L) 4.22 - 5.81 MIL/uL   Hemoglobin 11.2 (L) 13.0 - 17.0 g/dL   HCT 35.9 (L) 39.0 - 52.0 %    MCV 90.9 80.0 - 100.0 fL   MCH 28.4 26.0 - 34.0 pg   MCHC 31.2 30.0 - 36.0 g/dL   RDW 15.7 (H) 11.5 - 15.5 %   Platelets 253 150 - 400 K/uL   nRBC 1.0 (H) 0.0 - 0.2 %   Neutrophils Relative % 57 %   Neutro Abs 5.1 1.7 - 7.7 K/uL   Lymphocytes Relative 22 %   Lymphs Abs 2.0 0.7 - 4.0 K/uL   Monocytes Relative 12 %   Monocytes Absolute 1.1 (H) 0.1 - 1.0 K/uL   Eosinophils Relative 6 %   Eosinophils Absolute 0.6 (H) 0.0 - 0.5 K/uL   Basophils Relative 0 %   Basophils Absolute 0.0 0.0 - 0.1 K/uL   Immature Granulocytes 3 %   Abs Immature Granulocytes 0.29 (H) 0.00 - 0.07 K/uL    Comment: Performed at [facility], [address], [place], [place] [address]  Basic metabolic panel     Status: Abnormal   Collection Time: 05/13/19  6:34 AM  Result Value Ref Range   Sodium 135 135 - 145 mmol/L   Potassium 3.6 3.5 - 5.1 mmol/L   Chloride 94 (L) 98 - 111 mmol/L   CO2 29 22 - 32 mmol/L   Glucose, Bld 240 (H) 70 - 99 mg/dL   BUN  24 (H) 6 - 20 mg/dL   Creatinine, Ser 1.16 0.61 - 1.24 mg/dL   Calcium 8.6 (L) 8.9 - 10.3 mg/dL   GFR calc non Af Amer >60 >60 mL/min   GFR calc Af Amer >60 >60 mL/min   Anion gap 12 5 - 15    Comment: Performed at [facility], [address], [place], [place] [address]  Glucose, capillary     Status: Abnormal   Collection Time: 05/13/19  7:25 AM  Result Value Ref Range   Glucose-Capillary 213 (H) 70 - 99 mg/dL  Glucose, capillary     Status: Abnormal   Collection Time: 05/13/19 10:56 AM  Result Value Ref Range   Glucose-Capillary 350 (H) 70 - 99 mg/dL  Glucose, capillary     Status: Abnormal   Collection Time: 05/13/19  4:09 PM  Result Value Ref Range   Glucose-Capillary 325 (H) 70 - 99 mg/dL  Glucose, capillary     Status: Abnormal   Collection Time: 05/13/19  9:21 PM  Result Value Ref Range   Glucose-Capillary 200 (H) 70 - 99 mg/dL   Comment 1 Notify RN    Comment 2 Document in Chart   Glucose, capillary     Status: Abnormal   Collection Time:  05/14/19  7:46 AM  Result Value Ref Range   Glucose-Capillary 270 (H) 70 - 99 mg/dL   Comment 1 Notify RN    Comment 2 Document in Chart     ABGS No results for input(s): PHART, PO2ART, TCO2, HCO3 in the last 72 hours.  Invalid input(s): PCO2 CULTURES No results found for this or any previous visit (from the past 240 hour(s)). Studies/Results: No results found.  Medications:  Prior to Admission:  Medications Prior to Admission  Medication Sig Dispense Refill Last Dose  . albuterol (VENTOLIN HFA) 108 (90 BASE) MCG/ACT inhaler Inhale 2 puffs into the lungs every 6 (six) hours as needed for wheezing or shortness of breath.     . alfuzosin (UROXATRAL) 10 MG 24 hr tablet Take 10 mg by mouth daily with breakfast.   05/03/2019 at Unknown time  . azithromycin (ZITHROMAX) 500 MG tablet 1 Tab daily per Dr Ivan Anchors medication 30 tablet 5 05/03/2019 at Unknown time  . benzonatate (TESSALON) 200 MG capsule Take 200 mg by mouth 3 (three) times daily.   05/03/2019 at Unknown time  . budesonide (PULMICORT) 0.25 MG/2ML nebulizer solution Take 2 mLs (0.25 mg total) by nebulization 2 (two) times daily. 60 mL 12 05/03/2019 at Unknown time  . CARTIA XT 120 MG 24 hr capsule Take 1 capsule (120 mg total) by mouth daily. 7 capsule 0 05/03/2019 at Unknown time  . diphenhydrAMINE (BENADRYL) 25 mg capsule Take 25 mg by mouth every 8 (eight) hours as needed. Felt mouth swelling at ED visit 08/16/18.     [name] doxycycline (VIBRAMYCIN) 100 MG capsule TK 1 C PO BID   05/03/2019 at Unknown time  . fexofenadine (ALLEGRA) 180 MG tablet Take 180 mg by mouth daily.   05/03/2019 at Unknown time  . fluticasone (FLONASE) 50 MCG/ACT nasal spray Place 2 sprays into both nostrils daily as needed.   5   . Fluticasone-Umeclidin-Vilant (TRELEGY ELLIPTA) 100-62.5-25 MCG/INH AEPB Inhale 1 puff into the lungs daily.   05/03/2019 at Unknown time  . furosemide (LASIX) 40 MG tablet Take 1 tablet (40 mg total) by mouth daily. As  needed for swelling 30 tablet 11 05/03/2019 at Unknown time  . insulin glargine (LANTUS) 100 UNIT/ML  injection Inject 0.4 mLs (40 Units total) into the skin daily. 24 units daily 10 mL 2 05/03/2019 at Unknown time  . insulin lispro (HUMALOG) 100 UNIT/ML injection Take by sliding scale provided by hospital maximum amount daily is 60 units.  Check blood sugar 4 times a day (Patient taking differently: Inject 0-15 Units into the skin 3 (three) times daily with meals. Take by sliding scale provided by hospital maximum amount daily is 60 units.  Check blood sugar 4 times a day) 10 mL 11 05/03/2019 at Unknown time  . ipratropium (ATROVENT) 0.02 % nebulizer solution Take 0.5 mg by nebulization 2 (two) times daily.   5 05/03/2019 at Unknown time  . levalbuterol (XOPENEX) 0.63 MG/3ML nebulizer solution Take 0.63 mg by nebulization 2 (two) times daily.   05/03/2019 at Unknown time  . LORazepam (ATIVAN) 0.5 MG tablet Take 1 tablet (0.5 mg total) by mouth 2 (two) times daily as needed for anxiety or sleep. (Patient taking differently: Take 0.5 mg by mouth 4 (four) times daily. Pt takes it three times daily.) 20 tablet 0 05/03/2019 at Unknown time  . magnesium 30 MG tablet Take 30 mg by mouth daily.   05/03/2019 at Unknown time  . metFORMIN (GLUCOPHAGE) 500 MG tablet Take 1 tablet (500 mg total) by mouth daily with breakfast. 30 tablet 3 05/03/2019 at Unknown time  . montelukast (SINGULAIR) 10 MG tablet Take 1 tablet by mouth daily.   05/03/2019 at Unknown time  . nitroGLYCERIN (NITROSTAT) 0.4 MG SL tablet Place 1 tablet (0.4 mg total) under the tongue every 5 (five) minutes as needed for chest pain. 25 tablet 4   . omeprazole (PRILOSEC) 20 MG capsule Take 1 capsule (20 mg total) by mouth 2 (two) times daily before a meal. 180 capsule 3 05/03/2019 at Unknown time  . oxyCODONE-acetaminophen (PERCOCET) 10-325 MG tablet Take 1 tablet by mouth every 4 (four) hours as needed for pain.   05/03/2019 at Unknown time  .  potassium chloride SA (K-DUR,KLOR-CON) 20 MEQ tablet Take 1 tablet twice daily while on Lasix 60 tablet 2 05/03/2019 at Unknown time  . predniSONE (DELTASONE) 10 MG tablet Take 2 daily for 1 week then 1 daily (Patient taking differently: Take 20 mg by mouth daily with breakfast. Takes 2 tablets daily.) 60 tablet 5 05/03/2019 at Unknown time  . rifampin (RIFADIN) 300 MG capsule Take 2 capsules (600 mg total) by mouth daily. Long-term medication 60 capsule 11 05/03/2019 at Unknown time  . rosuvastatin (CRESTOR) 10 MG tablet Take 10 mg by mouth every morning.   11 05/03/2019 at Unknown time  . SYMBICORT 160-4.5 MCG/ACT inhaler INL 2 PFS PO BID   05/03/2019 at Unknown time  . tamsulosin (FLOMAX) 0.4 MG CAPS capsule Take 1 capsule (0.4 mg total) by mouth 2 (two) times daily. 60 capsule 2 05/03/2019 at Unknown time  . acetaminophen (TYLENOL) 325 MG tablet Take 2 tablets (650 mg total) by mouth every 6 (six) hours as needed for mild pain, moderate pain, fever or headache (or Fever >/= 101). (Patient not taking: Reported on 05/04/2019) 12 tablet 1 Not Taking at Unknown time   Scheduled: . azithromycin  500 mg Oral Daily  . benzonatate  200 mg Oral TID  . budesonide  0.25 mg Nebulization BID  . Clofazimine  100 mg Does not apply Daily  . diltiazem  120 mg Oral Daily  . feeding supplement (ENSURE ENLIVE)  237 mL Oral BID BM  . furosemide  80 mg Intravenous  Q12H  . guaiFENesin  600 mg Oral BID  . heparin  5,000 Units Subcutaneous Q8H  . insulin aspart  0-20 Units Subcutaneous TID WC  . insulin aspart  0-5 Units Subcutaneous QHS  . insulin aspart  12 Units Subcutaneous TID WC  . insulin glargine  65 Units Subcutaneous Daily  . ipratropium  0.5 mg Nebulization BID  . levalbuterol  1.25 mg Nebulization Q6H  . loratadine  10 mg Oral Daily  . magnesium oxide  200 mg Oral Daily  . metFORMIN  500 mg Oral Q breakfast  . mometasone-formoterol  2 puff Inhalation BID  . pantoprazole  40 mg Oral BID AC  .  potassium chloride SA  40 mEq Oral Daily  . predniSONE  40 mg Oral Q breakfast  . rifampin  600 mg Oral Daily  . rosuvastatin  10 mg Oral q morning - 10a  . sodium chloride flush  3 mL Intravenous Q12H  . tamsulosin  0.4 mg Oral BID  . umeclidinium-vilanterol  1 puff Inhalation Daily   Continuous: . sodium chloride     OEU:MPNTIR chloride, acetaminophen **OR** acetaminophen, diphenhydrAMINE, HYDROcodone-homatropine, levalbuterol, LORazepam, methocarbamol, nitroGLYCERIN, ondansetron **OR** ondansetron (ZOFRAN) IV, oxyCODONE-acetaminophen, polyethylene glycol, sodium chloride, sodium chloride flush, traZODone  Assesment: He is admitted with an acute exacerbation of CHF.  He is very slow to resolve his leg edema.  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  He is getting closer to discharge but not ready yet    LOS: 10 days   Alonza Bogus 05/14/2019, 9:23 AM

## 2019-05-14 NOTE — Plan of Care (Signed)

## 2019-05-15 LAB — GLUCOSE, CAPILLARY
Glucose-Capillary: 184 mg/dL — ABNORMAL HIGH (ref 70–99)
Glucose-Capillary: 264 mg/dL — ABNORMAL HIGH (ref 70–99)

## 2019-05-15 MED ORDER — KETOROLAC TROMETHAMINE 15 MG/ML IJ SOLN
15.0000 mg | Freq: Three times a day (TID) | INTRAMUSCULAR | Status: AC | PRN
  Administered 2019-05-15 – 2019-05-19 (×7): 15 mg via INTRAVENOUS
  Filled 2019-05-15 (×6): qty 1

## 2019-05-15 MED ORDER — BACITRACIN-NEOMYCIN-POLYMYXIN OINTMENT TUBE
TOPICAL_OINTMENT | Freq: Three times a day (TID) | CUTANEOUS | Status: DC
  Administered 2019-05-15 (×2): via TOPICAL
  Administered 2019-05-16: 1 via TOPICAL
  Administered 2019-05-16 – 2019-05-24 (×21): via TOPICAL
  Administered 2019-05-24: 1 via TOPICAL
  Administered 2019-05-25 – 2019-05-29 (×11): via TOPICAL
  Filled 2019-05-15: qty 14.17

## 2019-05-15 MED ORDER — BACITRACIN-NEOMYCIN-POLYMYXIN OINTMENT TUBE: TOPICAL_OINTMENT | Freq: Three times a day (TID) | CUTANEOUS | Status: DC

## 2019-05-15 NOTE — Plan of Care (Signed)

## 2019-05-15 NOTE — Progress Notes (Signed)
Subjective: He is still complaining of pain.  He has torn his fingernail and that will need some topical treatment.  His pain is mostly in his back and in his chest.  He is still coughing but not as much.  His legs are better.  Objective: Vital signs in last 24 hours: Temp:  [97.6 F (36.4 C)-98.2 F (36.8 C)] 98.2 F (36.8 C) (11/01 0551) Pulse Rate:  [96-110] 101 (11/01 0551) Resp:  [20] 20 (11/01 0551) BP: (106-127)/(66-82) 127/82 (11/01 0551) SpO2:  [93 %-99 %] 95 % (11/01 0730) Weight change:  Last BM Date: 05/14/19  Intake/Output from previous day: 10/31 0701 - 11/01 0700 In: 963 [P.O.:960; I.V.:3] Out: 2450 [Urine:2450]  PHYSICAL EXAM General appearance: alert, cooperative and no distress Resp: clear to auscultation bilaterally Cardio: regular rate and rhythm, S1, S2 normal, no murmur, click, rub or gallop GI: soft, non-tender; bowel sounds normal; no masses,  no organomegaly Extremities: His left leg is almost completely cleared of edema his right still shows 1-2+  Lab Results:  Results for orders placed or performed during the hospital encounter of 05/04/19 (from the past 48 hour(s))  Glucose, capillary     Status: Abnormal   Collection Time: 05/13/19 10:56 AM  Result Value Ref Range   Glucose-Capillary 350 (H) 70 - 99 mg/dL  Glucose, capillary     Status: Abnormal   Collection Time: 05/13/19  4:09 PM  Result Value Ref Range   Glucose-Capillary 325 (H) 70 - 99 mg/dL  Glucose, capillary     Status: Abnormal   Collection Time: 05/13/19  9:21 PM  Result Value Ref Range   Glucose-Capillary 200 (H) 70 - 99 mg/dL   Comment 1 Notify RN    Comment 2 Document in Chart   Glucose, capillary     Status: Abnormal   Collection Time: 05/14/19  7:46 AM  Result Value Ref Range   Glucose-Capillary 270 (H) 70 - 99 mg/dL   Comment 1 Notify RN    Comment 2 Document in Chart   Glucose, capillary     Status: Abnormal   Collection Time: 05/14/19 10:47 AM  Result Value Ref Range    Glucose-Capillary 362 (H) 70 - 99 mg/dL   Comment 1 Notify RN    Comment 2 Document in Chart   Glucose, capillary     Status: Abnormal   Collection Time: 05/14/19  9:48 PM  Result Value Ref Range   Glucose-Capillary 187 (H) 70 - 99 mg/dL   Comment 1 Notify RN    Comment 2 Document in Chart   Glucose, capillary     Status: Abnormal   Collection Time: 05/15/19  7:39 AM  Result Value Ref Range   Glucose-Capillary 184 (H) 70 - 99 mg/dL   Comment 1 Notify RN    Comment 2 Document in Chart     ABGS No results for input(s): PHART, PO2ART, TCO2, HCO3 in the last 72 hours.  Invalid input(s): PCO2 CULTURES No results found for this or any previous visit (from the past 240 hour(s)). Studies/Results: No results found.  Medications:  Prior to Admission:  Medications Prior to Admission  Medication Sig Dispense Refill Last Dose  . albuterol (VENTOLIN HFA) 108 (90 BASE) MCG/ACT inhaler Inhale 2 puffs into the lungs every 6 (six) hours as needed for wheezing or shortness of breath.     . alfuzosin (UROXATRAL) 10 MG 24 hr tablet Take 10 mg by mouth daily with breakfast.   05/03/2019 at Unknown time  .  azithromycin (ZITHROMAX) 500 MG tablet 1 Tab daily per Dr Ivan Anchors medication 30 tablet 5 05/03/2019 at Unknown time  . benzonatate (TESSALON) 200 MG capsule Take 200 mg by mouth 3 (three) times daily.   05/03/2019 at Unknown time  . budesonide (PULMICORT) 0.25 MG/2ML nebulizer solution Take 2 mLs (0.25 mg total) by nebulization 2 (two) times daily. 60 mL 12 05/03/2019 at Unknown time  . CARTIA XT 120 MG 24 hr capsule Take 1 capsule (120 mg total) by mouth daily. 7 capsule 0 05/03/2019 at Unknown time  . diphenhydrAMINE (BENADRYL) 25 mg capsule Take 25 mg by mouth every 8 (eight) hours as needed. Felt mouth swelling at ED visit 08/16/18.     [name] doxycycline (VIBRAMYCIN) 100 MG capsule TK 1 C PO BID   05/03/2019 at Unknown time  . fexofenadine (ALLEGRA) 180 MG tablet Take 180 mg by mouth  daily.   05/03/2019 at Unknown time  . fluticasone (FLONASE) 50 MCG/ACT nasal spray Place 2 sprays into both nostrils daily as needed.   5   . Fluticasone-Umeclidin-Vilant (TRELEGY ELLIPTA) 100-62.5-25 MCG/INH AEPB Inhale 1 puff into the lungs daily.   05/03/2019 at Unknown time  . furosemide (LASIX) 40 MG tablet Take 1 tablet (40 mg total) by mouth daily. As needed for swelling 30 tablet 11 05/03/2019 at Unknown time  . insulin glargine (LANTUS) 100 UNIT/ML injection Inject 0.4 mLs (40 Units total) into the skin daily. 24 units daily 10 mL 2 05/03/2019 at Unknown time  . insulin lispro (HUMALOG) 100 UNIT/ML injection Take by sliding scale provided by hospital maximum amount daily is 60 units.  Check blood sugar 4 times a day (Patient taking differently: Inject 0-15 Units into the skin 3 (three) times daily with meals. Take by sliding scale provided by hospital maximum amount daily is 60 units.  Check blood sugar 4 times a day) 10 mL 11 05/03/2019 at Unknown time  . ipratropium (ATROVENT) 0.02 % nebulizer solution Take 0.5 mg by nebulization 2 (two) times daily.   5 05/03/2019 at Unknown time  . levalbuterol (XOPENEX) 0.63 MG/3ML nebulizer solution Take 0.63 mg by nebulization 2 (two) times daily.   05/03/2019 at Unknown time  . LORazepam (ATIVAN) 0.5 MG tablet Take 1 tablet (0.5 mg total) by mouth 2 (two) times daily as needed for anxiety or sleep. (Patient taking differently: Take 0.5 mg by mouth 4 (four) times daily. Pt takes it three times daily.) 20 tablet 0 05/03/2019 at Unknown time  . magnesium 30 MG tablet Take 30 mg by mouth daily.   05/03/2019 at Unknown time  . metFORMIN (GLUCOPHAGE) 500 MG tablet Take 1 tablet (500 mg total) by mouth daily with breakfast. 30 tablet 3 05/03/2019 at Unknown time  . montelukast (SINGULAIR) 10 MG tablet Take 1 tablet by mouth daily.   05/03/2019 at Unknown time  . nitroGLYCERIN (NITROSTAT) 0.4 MG SL tablet Place 1 tablet (0.4 mg total) under the tongue every 5  (five) minutes as needed for chest pain. 25 tablet 4   . omeprazole (PRILOSEC) 20 MG capsule Take 1 capsule (20 mg total) by mouth 2 (two) times daily before a meal. 180 capsule 3 05/03/2019 at Unknown time  . oxyCODONE-acetaminophen (PERCOCET) 10-325 MG tablet Take 1 tablet by mouth every 4 (four) hours as needed for pain.   05/03/2019 at Unknown time  . potassium chloride SA (K-DUR,KLOR-CON) 20 MEQ tablet Take 1 tablet twice daily while on Lasix 60 tablet 2 05/03/2019 at Unknown time  . predniSONE (  DELTASONE) 10 MG tablet Take 2 daily for 1 week then 1 daily (Patient taking differently: Take 20 mg by mouth daily with breakfast. Takes 2 tablets daily.) 60 tablet 5 05/03/2019 at Unknown time  . rifampin (RIFADIN) 300 MG capsule Take 2 capsules (600 mg total) by mouth daily. Long-term medication 60 capsule 11 05/03/2019 at Unknown time  . rosuvastatin (CRESTOR) 10 MG tablet Take 10 mg by mouth every morning.   11 05/03/2019 at Unknown time  . SYMBICORT 160-4.5 MCG/ACT inhaler INL 2 PFS PO BID   05/03/2019 at Unknown time  . tamsulosin (FLOMAX) 0.4 MG CAPS capsule Take 1 capsule (0.4 mg total) by mouth 2 (two) times daily. 60 capsule 2 05/03/2019 at Unknown time  . acetaminophen (TYLENOL) 325 MG tablet Take 2 tablets (650 mg total) by mouth every 6 (six) hours as needed for mild pain, moderate pain, fever or headache (or Fever >/= 101). (Patient not taking: Reported on 05/04/2019) 12 tablet 1 Not Taking at Unknown time   Scheduled: . azithromycin  500 mg Oral Daily  . benzonatate  200 mg Oral TID  . budesonide  0.25 mg Nebulization BID  . Clofazimine  100 mg Does not apply Daily  . diltiazem  120 mg Oral Daily  . feeding supplement (ENSURE ENLIVE)  237 mL Oral BID BM  . furosemide  80 mg Intravenous Q12H  . guaiFENesin  600 mg Oral BID  . heparin  5,000 Units Subcutaneous Q8H  . insulin aspart  0-20 Units Subcutaneous TID WC  . insulin aspart  0-5 Units Subcutaneous QHS  . insulin aspart  12  Units Subcutaneous TID WC  . insulin glargine  65 Units Subcutaneous Daily  . ipratropium  0.5 mg Nebulization BID  . levalbuterol  1.25 mg Nebulization Q6H  . loratadine  10 mg Oral Daily  . magnesium oxide  200 mg Oral Daily  . metFORMIN  500 mg Oral Q breakfast  . mometasone-formoterol  2 puff Inhalation BID  . neomycin-bacitracin-polymyxin   Topical TID  . pantoprazole  40 mg Oral BID AC  . potassium chloride SA  40 mEq Oral Daily  . predniSONE  40 mg Oral Q breakfast  . rifampin  600 mg Oral Daily  . rosuvastatin  10 mg Oral q morning - 10a  . sodium chloride flush  3 mL Intravenous Q12H  . tamsulosin  0.4 mg Oral BID  . umeclidinium-vilanterol  1 puff Inhalation Daily   Continuous: . sodium chloride     YQM:VHQION chloride, acetaminophen **OR** acetaminophen, diphenhydrAMINE, HYDROcodone-homatropine, ketorolac, levalbuterol, LORazepam, methocarbamol, nitroGLYCERIN, ondansetron **OR** ondansetron (ZOFRAN) IV, oxyCODONE-acetaminophen, polyethylene glycol, sodium chloride, sodium chloride flush, traZODone  Assesment: He was admitted with acute exacerbation of CHF and he is clearing it.  He had a burst of atrial fib related to taking albuterol and that resolved  He has COPD which is severe at baseline  He has acute on chronic hypoxic respiratory failure  He has pulmonary MAC infection and is on treatment  He has chronic pain mostly in his back and is having some pain in his chest now that seems musculoskeletal  He has anxiety which is worse Active Problems:   Type 2 diabetes mellitus without complication (HCC)   COPD exacerbation (HCC)   PAF (paroxysmal atrial fibrillation) (HCC)   Chronic respiratory failure with hypoxia (HCC)   MAI (mycobacterium avium-intracellulare) (HCC)   Lower extremity edema   Vision disorder   Acute on chronic respiratory failure with hypoxia ([place])  GERD (gastroesophageal reflux disease)   Chronic pain   Anxiety   Dyspnea   Acute  exacerbation of CHF (congestive heart failure) ([place])    Plan: Continue current treatments.  He is approaching discharge    LOS: 11 days   Ryan [name] 05/15/2019, 8:28 AM

## 2019-05-16 LAB — GLUCOSE, CAPILLARY
Glucose-Capillary: 273 mg/dL — ABNORMAL HIGH (ref 70–99)
Glucose-Capillary: 269 mg/dL — ABNORMAL HIGH (ref 70–99)
Glucose-Capillary: 221 mg/dL — ABNORMAL HIGH (ref 70–99)
Glucose-Capillary: 245 mg/dL — ABNORMAL HIGH (ref 70–99)
Glucose-Capillary: 374 mg/dL — ABNORMAL HIGH (ref 70–99)
Glucose-Capillary: 213 mg/dL — ABNORMAL HIGH (ref 70–99)
Glucose-Capillary: 277 mg/dL — ABNORMAL HIGH (ref 70–99)

## 2019-05-16 MED ORDER — LORAZEPAM 0.5 MG PO TABS
0.5000 mg | ORAL_TABLET | Freq: Four times a day (QID) | ORAL | Status: DC | PRN
  Administered 2019-05-16 – 2019-05-29 (×34): 0.5 mg via ORAL
  Filled 2019-05-16 (×35): qty 1

## 2019-05-16 MED ORDER — DIPHENHYDRAMINE HCL 25 MG PO CAPS: 25.0000 mg | ORAL_CAPSULE | Freq: Three times a day (TID) | ORAL | Status: DC | PRN

## 2019-05-16 NOTE — Plan of Care (Signed)
  Problem: Acute Rehab PT Goals(only PT should resolve) Goal: Pt Will Go Supine/Side To Sit Flowsheets (Taken 05/16/2019 1232) Pt will go Supine/Side to Sit: Independently Goal: Patient Will Transfer Sit To/From Stand Flowsheets (Taken 05/16/2019 1232) Patient will transfer sit to/from stand: Independently Goal: Pt Will Transfer Bed To Chair/Chair To Bed Flowsheets (Taken 05/16/2019 1232) Pt will Transfer Bed to Chair/Chair to Bed: Independently Goal: Pt Will Ambulate Flowsheets (Taken 05/16/2019 1232) Pt will Ambulate:  75 feet  with supervision  12:34 PM, 05/16/19 Josue Hector PT DPT  Physical Therapist with [facility]  [phone]

## 2019-05-16 NOTE — Plan of Care (Signed)

## 2019-05-16 NOTE — TOC Progression Note (Signed)
[place] [phone]  [place] my Favorites Quality of Patient Care Rating 4  out of 5 stars Patient Survey Summary Rating 5 out of 5 stars [facility] [phone]  [place] my Favorites Quality of Patient Care Rating 3 out of 5 stars Patient Survey Summary Rating 5 out of [place] [phone]  [place] my Favorites Quality of Patient Care Rating 2  out of 5 stars Patient Survey Summary Rating 4 out of [place] [phone]  [place] my Favorites Quality of Patient Care Rating 4  out of 5 stars Patient Survey Summary Rating 4 out of 5 stars [place] [phone]) [phone]  Add [facility] my Favorites Quality of Patient Care Rating 4  out of 5 stars Patient Survey Summary Rating 5 out of 5 stars [place] [phone]  [place], [facility] my Favorites Quality of Patient Care Rating 4 out of 5 stars Patient Survey Summary Rating 4 out of 5 stars [place] [phone]  [place] my Favorites Quality of Patient Care Rating 4 out of 5 stars Patient Survey Summary Rating 4 out of 5 stars [facility] AGE [phone]  [place] my Favorites Quality of Patient Care Rating 2  out of 5 stars Patient Survey Summary Rating 5 out of 5 stars [facility] [place] [phone]  Add [facility] [place] my Favorites Quality of Patient Care Rating 3  out of 5 stars Patient Survey Summary Rating 4 out of 5 stars [place] [phone]  [place] my Favorites Quality of Patient Care Rating 3 out of 5 stars Patient Survey Summary Rating 4 out of 5 stars [facility] [phone]  Add [facility] my Favorites Quality of Patient Care Rating 4  out of 5 stars Patient Survey Summary Rating 3 out of [place] [phone]  [place] my Favorites Quality of Patient Care Rating

## 2019-05-16 NOTE — Care Management Important Message (Signed)
Important Message  Patient Details  Name: Ryan [name] MRN: [id number] Date of Birth: Nov 29, 1960   Medicare Important Message Given:  Yes     [name] 05/16/2019, 11:41 AM

## 2019-05-16 NOTE — Evaluation (Signed)
Physical Therapy Evaluation Patient Details Name: Ryan [name] MRN: [id number] DOB: 1960/08/15 Today's Date: 05/16/2019   History of Present Illness  Ryan [name]  is a 58 y.o. male with past medical history relevant for chronic hypoxic respiratory failure on 6 L of oxygen at home, DM, HTN, COPD, CHF, HLD and GERD who presents to the ED with worsening shortness of breath and cough-worsening hypoxia, worsening bilateral lower extremity edema, dyspnea at rest and dyspnea on exertion    Clinical Impression  Patient severely limited due to fatigue/ desaturation with functional activity. Patient was able to perform bed mobility and transfers Mod I, but with noted increased fatigue. Patient tolerated sitting at EOB fairly well and was able to maintain baseline SpO2 at 96% at rest. Patient able to perform sit to stand Mod I and demos fair static standing balance, but with noted desaturation and increased fatigue. Patient ambulated 10 feet from bed to door and return to bed with no AD, no LOB, but with significant drop in Sp02 from 96 to 84% on 6L. Patient able to return to baseline (94-96%) within 3-4 minutes of rest and O2 increased to 8L. Patient ambulated an additional 10 feet from bed to door with O2 at 8L, but became unsteady and fatigued. Patient required standing rest, and RW at midway to return safely to bedside. Patient Sp02 at 88-90%. Patient required 4-5 minutes rest at 8L to return to baseline before O2 was reduced to 6L. Patient returned to bed with call bell and phone in reach, and instructed to ring bell for assistance with transfers and ambulation due to severe fatigueability. Patient verbalized agreement and understanding. Patient will benefit from continued physical therapy in hospital and recommended venue below to increase strength, balance, endurance for safe ADLs and gait.      Follow Up Recommendations SNF;Supervision/Assistance - 24 hour;Supervision for mobility/OOB    Equipment  Recommendations       Recommendations for Other Services       Precautions / Restrictions Precautions Precautions: Fall Restrictions Weight Bearing Restrictions: No      Mobility  Bed Mobility Overal bed mobility: Independent             General bed mobility comments: Labored movement  Transfers Overall transfer level: Modified independent               General transfer comment: slightly slowed, labored movement  Ambulation/Gait Ambulation/Gait assistance: Supervision Gait Distance (Feet): 10 Feet Assistive device: Rolling walker (2 wheeled) Gait Pattern/deviations: Decreased step length - left;Decreased step length - right Gait velocity: Decreased   General Gait Details: Patient able to ambulate 10 feet x 2 in room; 10 feet with no AD, fairly steady, but significant desaturation, Sp02 dropped from 96% to 84% on 6L, increased to 8L. Patient returned to baseline O2 within 3-4 minutes at rest. Second 10 feet attempted with no AD, with O2 at 8L, patient noted increased fatigue midway and required RW for return to bedside. Patient O2 returned to baseline (94-96%). Patient returned to 6L O2 at rest with basline SpO2.  Stairs            Wheelchair Mobility    Modified Rankin (Stroke Patients Only)       Balance Overall balance assessment: Needs assistance Sitting-balance support: No upper extremity supported;Feet supported Sitting balance-Leahy Scale: Good Sitting balance - Comments: Seated at EOB   Standing balance support: No upper extremity supported Standing balance-Leahy Scale: Fair Standing balance comment: Standing, no AD, but fatigues  quickly                             Pertinent Vitals/Pain Pain Assessment: 0-10 Pain Score: 2  Pain Location: Chest Pain Descriptors / Indicators: Discomfort Pain Intervention(s): Limited activity within patient's tolerance;Monitored during session    Home Living Family/patient expects to be  discharged to:: Private residence Living Arrangements: Spouse/significant other;Children Available Help at Discharge: Family;Available 24 hours/day Type of Home: House Home Access: Stairs to enter Entrance Stairs-Rails: Right;Left;Can reach both Entrance Stairs-Number of Steps: 6 Home Layout: One level Home Equipment: Walker - 2 wheels;Cane - single point;Tub bench;Bedside commode      Prior Function Level of Independence: Independent         Comments: Patient reports he is independant with ambulation in home. Patient reports using 6 L oxygen continuous. Patient states he does not ambulate in community due to being high risk for COVID-19     Hand Dominance        Extremity/Trunk Assessment   Upper Extremity Assessment Upper Extremity Assessment: Generalized weakness    Lower Extremity Assessment Lower Extremity Assessment: Generalized weakness    Cervical / Trunk Assessment Cervical / Trunk Assessment: Normal  Communication   Communication: No difficulties  Cognition Arousal/Alertness: Awake/alert Behavior During Therapy: WFL for tasks assessed/performed;Agitated Overall Cognitive Status: Within Functional Limits for tasks assessed                                        General Comments      Exercises     Assessment/Plan    PT Assessment Patient needs continued PT services  PT Problem List Decreased strength;Decreased activity tolerance;Decreased balance;Decreased mobility;Cardiopulmonary status limiting activity       PT Treatment Interventions DME instruction;Gait training;Functional mobility training;Therapeutic activities;Therapeutic exercise;Patient/family education    PT Goals (Current goals can be found in the Care Plan section)  Acute Rehab PT Goals Patient Stated Goal: Return home PT Goal Formulation: With patient Time For Goal Achievement: 05/30/19 Potential to Achieve Goals: Good    Frequency Min 3X/week   Barriers to  discharge        Co-evaluation               AM-PAC PT "6 Clicks" Mobility  Outcome Measure Help needed turning from your back to your side while in a flat bed without using bedrails?: None Help needed moving from lying on your back to sitting on the side of a flat bed without using bedrails?: None Help needed moving to and from a bed to a chair (including a wheelchair)?: A Little Help needed standing up from a chair using your arms (e.g., wheelchair or bedside chair)?: A Little Help needed to walk in hospital room?: A Lot Help needed climbing 3-5 steps with a railing? : A Lot 6 Click Score: 18    End of Session Equipment Utilized During Treatment: Oxygen Activity Tolerance: Patient limited by fatigue Patient left: in bed;with call bell/phone within reach Nurse Communication: Mobility status PT Visit Diagnosis: Unsteadiness on feet (R26.81);Other abnormalities of gait and mobility (R26.89);Muscle weakness (generalized) (M62.81);Difficulty in walking, not elsewhere classified (R26.2)    Time: 1610-9604 PT Time Calculation (min) (ACUTE ONLY): 40 min   Charges:   PT Evaluation $PT Eval Moderate Complexity: 1 Mod PT Treatments $Therapeutic Activity: 8-22 mins       12:29  PM, 05/16/19 Josue Hector PT DPT  Physical Therapist with [facility]  [phone]

## 2019-05-16 NOTE — Progress Notes (Signed)
Inpatient Diabetes Program Recommendations  AACE/ADA: New Consensus Statement on Inpatient Glycemic Control   Target Ranges:  Prepandial:   less than 140 mg/dL      Peak postprandial:   less than 180 mg/dL (1-2 hours)      Critically ill patients:  140 - 180 mg/dL   Results for ESPEN, BETHEL (MRN [id number]) as of 05/16/2019 12:04  Ref. Range 05/15/2019 07:39 05/15/2019 11:25 05/15/2019 15:38 05/15/2019 21:02 05/16/2019 07:32 05/16/2019 10:59  Glucose-Capillary Latest Ref Range: 70 - 99 mg/dL 184 (H) 269 (H) 264 (H) 221 (H) 245 (H) 374 (H)   Review of Glycemic Control  Diabetes history: DM2 Outpatient Diabetes medications: Lantus 40 units daily, Humalog 0-15 units TID with meals, Metformin 500 mg QAM Current orders for Inpatient glycemic control: Lantus 65 units daily, Novolog 12 units TID with  Meals, Novolog 0-20 units TID with meals, Novolog 0-5 units QHS, Metformin 500 mg QAM; Prednisone 40 mg QAM  Inpatient Diabetes Program Recommendations:   Insulin-Basal: If steroids are continued, please consider increasing Lantus to 68 units daily.  Insulin-Meal Coverage: If steroids are continued, please consider increasing meal coverage to Novolog 18 units TID with meals if patient eats at least 50% of meals.  Thanks, Barnie Alderman, RN, MSN, CDE Diabetes Coordinator Inpatient Diabetes Program [phone] (Team Pager from 8am to 5pm)

## 2019-05-16 NOTE — TOC Progression Note (Signed)
Transition of Care [facility]) - Progression Note    Patient Details  Name: Ryan [name] MRN: [id number] Date of Birth: [birth year]  [facility] CM/SW Contact  Boneta Lucks, RN Phone Number: 05/16/2019, 2:36 PM  Clinical Narrative:   PT recommending SNF. Spoke with patient and wife at the bedside. Patient does not remember discussing Home health last week. Patient refusing SNF due to no visitors allowed.  Patient lives with his wife and Son states he will wants to go home with Home Health. [facility] with [facility] for PT/RN. TOC to follow, pt is not medically ready.   Expected Discharge Plan: [place] Barriers to Discharge: Continued Medical Work up  Expected Discharge Plan and Services Expected Discharge Plan: [place] In-house Referral: Clinical Social Work     Living arrangements for the past 2 months: Mobile Home                    HH Arranged: PT, RN [facility] Agency: [place] ([facility]) Date HH Agency Contacted: 05/16/19 Time [place]: 1435 Representative spoke with at [place]: [place]   Readmission Risk Interventions Readmission Risk Prevention Plan 05/06/2019  Transportation Screening Complete  Medication Review Press photographer) Complete  PCP or Specialist appointment within 3-5 days of discharge Not Complete  PCP/Specialist Appt Not Complete comments Dr. Luan Pulling staff schedule their own appointments for hospital dc follow up  [place] or [place] Patient refused  SW Recovery Care/Counseling Consult Complete  Skilled [place] Not Applicable  Some recent data might be hidden

## 2019-05-16 NOTE — Progress Notes (Signed)
Subjective: He says he feels about the same.  Still having some chest pain that he relates to having an echocardiogram.  His swelling is better but not gone  Objective: Vital signs in last 24 hours: Temp:  [98 F (36.7 C)-98.7 F (37.1 C)] 98.7 F (37.1 C) (11/02 0506) Pulse Rate:  [99-102] 99 (11/02 0506) Resp:  [20-22] 22 (11/02 0506) BP: (120-133)/(71-90) 133/71 (11/02 0506) SpO2:  [94 %-99 %] 94 % (11/02 0506) Weight change:  Last BM Date: 05/15/19  Intake/Output from previous day: 11/01 0701 - 11/02 0700 In: 720 [P.O.:720] Out: 1000 [Urine:1000]  PHYSICAL EXAM General appearance: alert, cooperative and no distress Resp: clear to auscultation bilaterally Cardio: regular rate and rhythm, S1, S2 normal, no murmur, click, rub or gallop GI: soft, non-tender; bowel sounds normal; no masses,  no organomegaly Extremities: Trace edema left leg 1+ on the right  Lab Results:  Results for orders placed or performed during the hospital encounter of 05/04/19 (from the past 48 hour(s))  Glucose, capillary     Status: Abnormal   Collection Time: 05/14/19 10:47 AM  Result Value Ref Range   Glucose-Capillary 362 (H) 70 - 99 mg/dL   Comment 1 Notify RN    Comment 2 Document in Chart   Glucose, capillary     Status: Abnormal   Collection Time: 05/14/19  9:48 PM  Result Value Ref Range   Glucose-Capillary 187 (H) 70 - 99 mg/dL   Comment 1 Notify RN    Comment 2 Document in Chart   Glucose, capillary     Status: Abnormal   Collection Time: 05/15/19  7:39 AM  Result Value Ref Range   Glucose-Capillary 184 (H) 70 - 99 mg/dL   Comment 1 Notify RN    Comment 2 Document in Chart   Glucose, capillary     Status: Abnormal   Collection Time: 05/15/19  3:38 PM  Result Value Ref Range   Glucose-Capillary 264 (H) 70 - 99 mg/dL  Glucose, capillary     Status: Abnormal   Collection Time: 05/15/19  9:02 PM  Result Value Ref Range   Glucose-Capillary 221 (H) 70 - 99 mg/dL   Comment 1 QC Due     Comment 2 Notify RN    Comment 3 Document in Chart   Glucose, capillary     Status: Abnormal   Collection Time: 05/16/19  7:32 AM  Result Value Ref Range   Glucose-Capillary 245 (H) 70 - 99 mg/dL   Comment 1 Notify RN    Comment 2 Document in Chart     ABGS No results for input(s): PHART, PO2ART, TCO2, HCO3 in the last 72 hours.  Invalid input(s): PCO2 CULTURES No results found for this or any previous visit (from the past 240 hour(s)). Studies/Results: No results found.  Medications:  Prior to Admission:  Medications Prior to Admission  Medication Sig Dispense Refill Last Dose  . albuterol (VENTOLIN HFA) 108 (90 BASE) MCG/ACT inhaler Inhale 2 puffs into the lungs every 6 (six) hours as needed for wheezing or shortness of breath.     . alfuzosin (UROXATRAL) 10 MG 24 hr tablet Take 10 mg by mouth daily with breakfast.   05/03/2019 at Unknown time  . azithromycin (ZITHROMAX) 500 MG tablet 1 Tab daily per Dr Ivan Anchors medication 30 tablet 5 05/03/2019 at Unknown time  . benzonatate (TESSALON) 200 MG capsule Take 200 mg by mouth 3 (three) times daily.   05/03/2019 at Unknown time  . budesonide (PULMICORT) 0.25  MG/2ML nebulizer solution Take 2 mLs (0.25 mg total) by nebulization 2 (two) times daily. 60 mL 12 05/03/2019 at Unknown time  . CARTIA XT 120 MG 24 hr capsule Take 1 capsule (120 mg total) by mouth daily. 7 capsule 0 05/03/2019 at Unknown time  . diphenhydrAMINE (BENADRYL) 25 mg capsule Take 25 mg by mouth every 8 (eight) hours as needed. Felt mouth swelling at ED visit 08/16/18.     [name] doxycycline (VIBRAMYCIN) 100 MG capsule TK 1 C PO BID   05/03/2019 at Unknown time  . fexofenadine (ALLEGRA) 180 MG tablet Take 180 mg by mouth daily.   05/03/2019 at Unknown time  . fluticasone (FLONASE) 50 MCG/ACT nasal spray Place 2 sprays into both nostrils daily as needed.   5   . Fluticasone-Umeclidin-Vilant (TRELEGY ELLIPTA) 100-62.5-25 MCG/INH AEPB Inhale 1 puff into the lungs  daily.   05/03/2019 at Unknown time  . furosemide (LASIX) 40 MG tablet Take 1 tablet (40 mg total) by mouth daily. As needed for swelling 30 tablet 11 05/03/2019 at Unknown time  . insulin glargine (LANTUS) 100 UNIT/ML injection Inject 0.4 mLs (40 Units total) into the skin daily. 24 units daily 10 mL 2 05/03/2019 at Unknown time  . insulin lispro (HUMALOG) 100 UNIT/ML injection Take by sliding scale provided by hospital maximum amount daily is 60 units.  Check blood sugar 4 times a day (Patient taking differently: Inject 0-15 Units into the skin 3 (three) times daily with meals. Take by sliding scale provided by hospital maximum amount daily is 60 units.  Check blood sugar 4 times a day) 10 mL 11 05/03/2019 at Unknown time  . ipratropium (ATROVENT) 0.02 % nebulizer solution Take 0.5 mg by nebulization 2 (two) times daily.   5 05/03/2019 at Unknown time  . levalbuterol (XOPENEX) 0.63 MG/3ML nebulizer solution Take 0.63 mg by nebulization 2 (two) times daily.   05/03/2019 at Unknown time  . LORazepam (ATIVAN) 0.5 MG tablet Take 1 tablet (0.5 mg total) by mouth 2 (two) times daily as needed for anxiety or sleep. (Patient taking differently: Take 0.5 mg by mouth 4 (four) times daily. Pt takes it three times daily.) 20 tablet 0 05/03/2019 at Unknown time  . magnesium 30 MG tablet Take 30 mg by mouth daily.   05/03/2019 at Unknown time  . metFORMIN (GLUCOPHAGE) 500 MG tablet Take 1 tablet (500 mg total) by mouth daily with breakfast. 30 tablet 3 05/03/2019 at Unknown time  . montelukast (SINGULAIR) 10 MG tablet Take 1 tablet by mouth daily.   05/03/2019 at Unknown time  . nitroGLYCERIN (NITROSTAT) 0.4 MG SL tablet Place 1 tablet (0.4 mg total) under the tongue every 5 (five) minutes as needed for chest pain. 25 tablet 4   . omeprazole (PRILOSEC) 20 MG capsule Take 1 capsule (20 mg total) by mouth 2 (two) times daily before a meal. 180 capsule 3 05/03/2019 at Unknown time  . oxyCODONE-acetaminophen (PERCOCET)  10-325 MG tablet Take 1 tablet by mouth every 4 (four) hours as needed for pain.   05/03/2019 at Unknown time  . potassium chloride SA (K-DUR,KLOR-CON) 20 MEQ tablet Take 1 tablet twice daily while on Lasix 60 tablet 2 05/03/2019 at Unknown time  . predniSONE (DELTASONE) 10 MG tablet Take 2 daily for 1 week then 1 daily (Patient taking differently: Take 20 mg by mouth daily with breakfast. Takes 2 tablets daily.) 60 tablet 5 05/03/2019 at Unknown time  . rifampin (RIFADIN) 300 MG capsule Take 2 capsules (600  mg total) by mouth daily. Long-term medication 60 capsule 11 05/03/2019 at Unknown time  . rosuvastatin (CRESTOR) 10 MG tablet Take 10 mg by mouth every morning.   11 05/03/2019 at Unknown time  . SYMBICORT 160-4.5 MCG/ACT inhaler INL 2 PFS PO BID   05/03/2019 at Unknown time  . tamsulosin (FLOMAX) 0.4 MG CAPS capsule Take 1 capsule (0.4 mg total) by mouth 2 (two) times daily. 60 capsule 2 05/03/2019 at Unknown time  . acetaminophen (TYLENOL) 325 MG tablet Take 2 tablets (650 mg total) by mouth every 6 (six) hours as needed for mild pain, moderate pain, fever or headache (or Fever >/= 101). (Patient not taking: Reported on 05/04/2019) 12 tablet 1 Not Taking at Unknown time   Scheduled: . azithromycin  500 mg Oral Daily  . benzonatate  200 mg Oral TID  . budesonide  0.25 mg Nebulization BID  . Clofazimine  100 mg Does not apply Daily  . diltiazem  120 mg Oral Daily  . feeding supplement (ENSURE ENLIVE)  237 mL Oral BID BM  . furosemide  80 mg Intravenous Q12H  . guaiFENesin  600 mg Oral BID  . heparin  5,000 Units Subcutaneous Q8H  . insulin aspart  0-20 Units Subcutaneous TID WC  . insulin aspart  0-5 Units Subcutaneous QHS  . insulin aspart  12 Units Subcutaneous TID WC  . insulin glargine  65 Units Subcutaneous Daily  . ipratropium  0.5 mg Nebulization BID  . levalbuterol  1.25 mg Nebulization Q6H  . loratadine  10 mg Oral Daily  . magnesium oxide  200 mg Oral Daily  . metFORMIN  500  mg Oral Q breakfast  . mometasone-formoterol  2 puff Inhalation BID  . neomycin-bacitracin-polymyxin   Topical TID  . pantoprazole  40 mg Oral BID AC  . potassium chloride SA  40 mEq Oral Daily  . predniSONE  40 mg Oral Q breakfast  . rifampin  600 mg Oral Daily  . rosuvastatin  10 mg Oral q morning - 10a  . sodium chloride flush  3 mL Intravenous Q12H  . tamsulosin  0.4 mg Oral BID  . umeclidinium-vilanterol  1 puff Inhalation Daily   Continuous: . sodium chloride     WUJ:WJXBJY chloride, acetaminophen **OR** acetaminophen, diphenhydrAMINE, HYDROcodone-homatropine, ketorolac, levalbuterol, LORazepam, methocarbamol, nitroGLYCERIN, ondansetron **OR** ondansetron (ZOFRAN) IV, oxyCODONE-acetaminophen, polyethylene glycol, sodium chloride, sodium chloride flush, traZODone  Assesment: He was admitted with acute exacerbation of heart failure and he is slowly improving.  He has COPD which is being treated with inhaled bronchodilators and steroids  He has acute on chronic hypoxic respiratory failure  He has paroxysmal atrial fib and had a episode during this hospitalization it was related to receiving albuterol  He has diabetes on sliding scale   Active Problems:   Type 2 diabetes mellitus without complication (HCC)   COPD exacerbation (HCC)   PAF (paroxysmal atrial fibrillation) (HCC)   Chronic respiratory failure with hypoxia (HCC)   MAI (mycobacterium avium-intracellulare) (HCC)   Lower extremity edema   Vision disorder   Acute on chronic respiratory failure with hypoxia (HCC)   GERD (gastroesophageal reflux disease)   Chronic pain   Anxiety   Dyspnea   Acute exacerbation of CHF (congestive heart failure) ([place])    Plan: Continue treatments continue IV diuresis.    LOS: 12 days   Alonza Bogus 05/16/2019, 8:49 AM

## 2019-05-17 LAB — CBC WITH DIFFERENTIAL/PLATELET
Abs Immature Granulocytes: 0.13 10*3/uL — ABNORMAL HIGH (ref 0.00–0.07)
Basophils Absolute: 0 10*3/uL (ref 0.0–0.1)
Basophils Relative: 0 %
Eosinophils Absolute: 0.7 10*3/uL — ABNORMAL HIGH (ref 0.0–0.5)
Eosinophils Relative: 8 %
HCT: 34.3 % — ABNORMAL LOW (ref 39.0–52.0)
Hemoglobin: 10.6 g/dL — ABNORMAL LOW (ref 13.0–17.0)
Immature Granulocytes: 2 %
Lymphocytes Relative: 23 %
Lymphs Abs: 1.9 10*3/uL (ref 0.7–4.0)
MCH: 28.2 pg (ref 26.0–34.0)
MCHC: 30.9 g/dL (ref 30.0–36.0)
MCV: 91.2 fL (ref 80.0–100.0)
Monocytes Absolute: 1 10*3/uL (ref 0.1–1.0)
Monocytes Relative: 12 %
Neutro Abs: 4.6 10*3/uL (ref 1.7–7.7)
Neutrophils Relative %: 55 %
Platelets: 219 10*3/uL (ref 150–400)
RBC: 3.76 MIL/uL — ABNORMAL LOW (ref 4.22–5.81)
RDW: 16.2 % — ABNORMAL HIGH (ref 11.5–15.5)
WBC: 8.2 10*3/uL (ref 4.0–10.5)
nRBC: 0.5 % — ABNORMAL HIGH (ref 0.0–0.2)

## 2019-05-17 LAB — GLUCOSE, CAPILLARY
Glucose-Capillary: 151 mg/dL — ABNORMAL HIGH (ref 70–99)
Glucose-Capillary: 403 mg/dL — ABNORMAL HIGH (ref 70–99)
Glucose-Capillary: 190 mg/dL — ABNORMAL HIGH (ref 70–99)
Glucose-Capillary: 166 mg/dL — ABNORMAL HIGH (ref 70–99)

## 2019-05-17 LAB — BASIC METABOLIC PANEL
Anion gap: 13 (ref 5–15)
BUN: 21 mg/dL — ABNORMAL HIGH (ref 6–20)
CO2: 25 mmol/L (ref 22–32)
Calcium: 8.3 mg/dL — ABNORMAL LOW (ref 8.9–10.3)
Chloride: 95 mmol/L — ABNORMAL LOW (ref 98–111)
Creatinine, Ser: 1.14 mg/dL (ref 0.61–1.24)
GFR calc Af Amer: >60 mL/min (ref >60)
GFR calc non Af Amer: >60 mL/min (ref >60)
Glucose, Bld: 362 mg/dL — ABNORMAL HIGH (ref 70–99)
Potassium: 3.9 mmol/L (ref 3.5–5.1)
Sodium: 133 mmol/L — ABNORMAL LOW (ref 135–145)

## 2019-05-17 MED ORDER — MAGIC MOUTHWASH
5.0000 mL | Freq: Three times a day (TID) | ORAL | Status: DC
  Administered 2019-05-17 – 2019-05-29 (×49): 5 mL via ORAL
  Filled 2019-05-17 (×49): qty 5

## 2019-05-17 MED ORDER — INSULIN ASPART 100 UNIT/ML ~~LOC~~ SOLN
18.0000 [IU] | Freq: Three times a day (TID) | SUBCUTANEOUS | Status: DC
  Administered 2019-05-17 – 2019-05-29 (×37): 18 [IU] via SUBCUTANEOUS

## 2019-05-17 MED ORDER — MUPIROCIN 2 % EX OINT
TOPICAL_OINTMENT | Freq: Two times a day (BID) | CUTANEOUS | Status: DC
  Administered 2019-05-17 – 2019-05-29 (×24): via NASAL
  Filled 2019-05-17 (×2): qty 22

## 2019-05-17 NOTE — Progress Notes (Addendum)
Physical Therapy Treatment Patient Details Name: Ryan [name] MRN: [id number] DOB: [birth year] Today's Date: 05/17/2019    History of Present Illness Ryan [name]  is a 58 y.o. male with past medical history relevant for chronic hypoxic respiratory failure on 6 L of oxygen at home, DM, HTN, COPD, CHF, HLD and GERD who presents to the ED with worsening shortness of breath and cough-worsening hypoxia, worsening bilateral lower extremity edema, dyspnea at rest and dyspnea on exertion    PT Comments    Patient presents seated at bedside, agreeable for therapy and on 6 LPM.  Patient requires frequent rest breaks in between completing functional tasks and exercises, able to ambulate just outside of room, but had to take a standing rest break by leaning on side rail for approximately 6-8 minutes before walking back to bedside with SpO2 dropping below 90% while on 8 LPM, after returning bedside and sitting, SpO2 back up to 100% while on 6 LPM.  Patient will benefit from continued physical therapy in hospital and recommended venue below to increase strength, balance, endurance for safe ADLs and gait.    Follow Up Recommendations  SNF;Supervision for mobility/OOB;Supervision - Intermittent     Equipment Recommendations  None recommended by PT    Recommendations for Other Services       Precautions / Restrictions Precautions Precautions: Fall Precaution Comments: Home O2 dependent 6 LPM Restrictions Weight Bearing Restrictions: No    Mobility  Bed Mobility               General bed mobility comments: Presents seated at bedside  Transfers Overall transfer level: Needs assistance Equipment used: Rolling walker (2 wheeled);None;1 person hand held assist Transfers: Sit to/from [facility] Sit to Stand: Min guard Stand pivot transfers: Min guard       General transfer comment: slow labored movement, frequent rest breaks due to difficulty  breathing  Ambulation/Gait Ambulation/Gait assistance: Supervision;Min guard Gait Distance (Feet): 20 Feet Assistive device: Rolling walker (2 wheeled);1 person hand held assist;None Gait Pattern/deviations: Decreased step length - right;Decreased step length - left;Decreased stride length Gait velocity: Decreased   General Gait Details: slow labored cadence with frequent standing rest breaks due to c/o SOB with SpO2 dropping from 95% to 85% while on 8 LPM   Stairs             Wheelchair Mobility    Modified Rankin (Stroke Patients Only)       Balance Overall balance assessment: Needs assistance Sitting-balance support: No upper extremity supported Sitting balance-Leahy Scale: Good Sitting balance - Comments: Seated at EOB   Standing balance support: During functional activity;No upper extremity supported Standing balance-Leahy Scale: Fair Standing balance comment: fair/poor without AD, fair with hand held assist or using RW                            Cognition Arousal/Alertness: Awake/alert Behavior During Therapy: WFL for tasks assessed/performed;Agitated Overall Cognitive Status: Within Functional Limits for tasks assessed                                        Exercises General Exercises - Lower Extremity Long Arc Quad: Seated;AROM;Strengthening;Both;5 reps Hip Flexion/Marching: Seated;AROM;Both;5 reps Toe Raises: Seated;AROM;Strengthening;Both;10 reps Heel Raises: Seated;AROM;Strengthening;Both;10 reps    General Comments        Pertinent Vitals/Pain Pain Assessment: No/denies pain  Home Living                      Prior Function            PT Goals (current goals can now be found in the care plan section) Acute Rehab PT Goals Patient Stated Goal: Return home PT Goal Formulation: With patient Time For Goal Achievement: 05/30/19 Potential to Achieve Goals: Good Progress towards PT goals: Progressing  toward goals    Frequency    Min 3X/week      PT Plan Current plan remains appropriate    Co-evaluation              AM-PAC PT "6 Clicks" Mobility   Outcome Measure  Help needed turning from your back to your side while in a flat bed without using bedrails?: None Help needed moving from lying on your back to sitting on the side of a flat bed without using bedrails?: None Help needed moving to and from a bed to a chair (including a wheelchair)?: A Little Help needed standing up from a chair using your arms (e.g., wheelchair or bedside chair)?: A Little Help needed to walk in hospital room?: A Little Help needed climbing 3-5 steps with a railing? : A Lot 6 Click Score: 19    End of Session Equipment Utilized During Treatment: Oxygen Activity Tolerance: Patient tolerated treatment well;Patient limited by fatigue Patient left: in bed;with call bell/phone within reach(seated at bedside) Nurse Communication: Mobility status PT Visit Diagnosis: Unsteadiness on feet (R26.81);Other abnormalities of gait and mobility (R26.89);Muscle weakness (generalized) (M62.81)     Time: 9728-2060 PT Time Calculation (min) (ACUTE ONLY): 32 min  Charges:  $Therapeutic Exercise: 8-22 mins $Therapeutic Activity: 8-22 mins                     4:12 PM, 05/17/19 Lonell Grandchild, MPT Physical Therapist with [facility] 336 [phone] office [phone] mobile phone

## 2019-05-17 NOTE — Progress Notes (Signed)
Inpatient Diabetes Program Recommendations  AACE/ADA: New Consensus Statement on Inpatient Glycemic Control   Target Ranges:  Prepandial:   less than 140 mg/dL      Peak postprandial:   less than 180 mg/dL (1-2 hours)      Critically ill patients:  140 - 180 mg/dL   Results for Ryan [name], Ryan [name] (MRN [id number]) as of 05/17/2019 11:12  Ref. Range 05/17/2019 11:09  Glucose-Capillary Latest Ref Range: 70 - 99 mg/dL 403 (H)   Results for Ryan [name], Ryan [name] (MRN [id number]) as of 05/17/2019 11:09  Ref. Range 05/16/2019 07:32 05/16/2019 10:59 05/16/2019 16:51 05/16/2019 21:15 05/17/2019 07:31 05/17/2019 10:23  Glucose-Capillary Latest Ref Range: 70 - 99 mg/dL 245 (H)  Novolog 19 units  374 (H)  Novolog 32 units  Lantus 65 units 213 (H)  Novolog 19 units 277 (H)  Novolog 3 units 151 (H)  Novolog 16 units      Lantus 65 units   Review of Glycemic Control  Diabetes history:DM2 Outpatient Diabetes medications:Lantus 40 units daily, Humalog 0-15 units TID with meals, Metformin 500 mg QAM Current orders for Inpatient glycemic control:Lantus 65 units daily, Novolog 12 units TID with Meals, Novolog 0-20 units TID with meals, Novolog 0-5 units QHS, Metformin 500 mg QAM; Prednisone 40 mg QAM  Inpatient Diabetes Program Recommendations:  Insulin-Meal Coverage:If steroids are continued, please consider increasing meal coverage to Novolog 20 units TID with meals if patient eats at least 50% of meals.  Thanks, Barnie Alderman, RN, MSN, CDE Diabetes Coordinator Inpatient Diabetes Program [phone] (Team Pager from 8am to 5pm)

## 2019-05-17 NOTE — Progress Notes (Signed)
Subjective: He is having trouble with what looks like some early blisters on his feet.  He still has marked swelling of his right leg.  He has thrush in his mouth now.  Objective: Vital signs in last 24 hours: Temp:  [97.4 F (36.3 C)-98 F (36.7 C)] 97.4 F (36.3 C) (11/03 0518) Pulse Rate:  [95-112] 95 (11/03 0518) Resp:  [18-20] 20 (11/03 0518) BP: (120-134)/(65-96) 130/71 (11/03 0518) SpO2:  [95 %-99 %] 99 % (11/03 0751) Weight:  [89.5 kg] 89.5 kg (11/03 0500) Weight change:  Last BM Date: 05/16/19  Intake/Output from previous day: 11/02 0701 - 11/03 0700 In: 1923 [P.O.:1920; I.V.:3] Out: 1550 [Urine:1550]  PHYSICAL EXAM General appearance: alert, cooperative and mild distress Resp: rhonchi bilaterally Cardio: regular rate and rhythm, S1, S2 normal, no murmur, click, rub or gallop GI: abdomen soft. Extremities: He has swelling of the left leg which is much improved and swelling of the right which is about the same.  He has some erythema of the toes.  Lab Results:  Results for orders placed or performed during the hospital encounter of 05/04/19 (from the past 48 hour(s))  Glucose, capillary     Status: Abnormal   Collection Time: 05/15/19 11:25 AM  Result Value Ref Range   Glucose-Capillary 269 (H) 70 - 99 mg/dL   Comment 1 Notify RN    Comment 2 Document in Chart   Glucose, capillary     Status: Abnormal   Collection Time: 05/15/19  3:38 PM  Result Value Ref Range   Glucose-Capillary 264 (H) 70 - 99 mg/dL  Glucose, capillary     Status: Abnormal   Collection Time: 05/15/19  9:02 PM  Result Value Ref Range   Glucose-Capillary 221 (H) 70 - 99 mg/dL   Comment 1 QC Due    Comment 2 Notify RN    Comment 3 Document in Chart   Glucose, capillary     Status: Abnormal   Collection Time: 05/16/19  7:32 AM  Result Value Ref Range   Glucose-Capillary 245 (H) 70 - 99 mg/dL   Comment 1 Notify RN    Comment 2 Document in Chart   Glucose, capillary     Status: Abnormal   Collection Time: 05/16/19 10:59 AM  Result Value Ref Range   Glucose-Capillary 374 (H) 70 - 99 mg/dL   Comment 1 Notify RN    Comment 2 Document in Chart   Glucose, capillary     Status: Abnormal   Collection Time: 05/16/19  4:51 PM  Result Value Ref Range   Glucose-Capillary 213 (H) 70 - 99 mg/dL   Comment 1 Notify RN    Comment 2 Document in Chart   Glucose, capillary     Status: Abnormal   Collection Time: 05/16/19  9:15 PM  Result Value Ref Range   Glucose-Capillary 277 (H) 70 - 99 mg/dL  Glucose, capillary     Status: Abnormal   Collection Time: 05/17/19  7:31 AM  Result Value Ref Range   Glucose-Capillary 151 (H) 70 - 99 mg/dL    ABGS No results for input(s): PHART, PO2ART, TCO2, HCO3 in the last 72 hours.  Invalid input(s): PCO2 CULTURES No results found for this or any previous visit (from the past 240 hour(s)). Studies/Results: No results found.  Medications:  Prior to Admission:  Medications Prior to Admission  Medication Sig Dispense Refill Last Dose  . albuterol (VENTOLIN HFA) 108 (90 BASE) MCG/ACT inhaler Inhale 2 puffs into the lungs every 6 (  six) hours as needed for wheezing or shortness of breath.     . alfuzosin (UROXATRAL) 10 MG 24 hr tablet Take 10 mg by mouth daily with breakfast.   05/03/2019 at Unknown time  . azithromycin (ZITHROMAX) 500 MG tablet 1 Tab daily per Dr Ivan Anchors medication 30 tablet 5 05/03/2019 at Unknown time  . benzonatate (TESSALON) 200 MG capsule Take 200 mg by mouth 3 (three) times daily.   05/03/2019 at Unknown time  . budesonide (PULMICORT) 0.25 MG/2ML nebulizer solution Take 2 mLs (0.25 mg total) by nebulization 2 (two) times daily. 60 mL 12 05/03/2019 at Unknown time  . CARTIA XT 120 MG 24 hr capsule Take 1 capsule (120 mg total) by mouth daily. 7 capsule 0 05/03/2019 at Unknown time  . diphenhydrAMINE (BENADRYL) 25 mg capsule Take 25 mg by mouth every 8 (eight) hours as needed. Felt mouth swelling at ED visit 08/16/18.      [name] doxycycline (VIBRAMYCIN) 100 MG capsule TK 1 C PO BID   05/03/2019 at Unknown time  . fexofenadine (ALLEGRA) 180 MG tablet Take 180 mg by mouth daily.   05/03/2019 at Unknown time  . fluticasone (FLONASE) 50 MCG/ACT nasal spray Place 2 sprays into both nostrils daily as needed.   5   . Fluticasone-Umeclidin-Vilant (TRELEGY ELLIPTA) 100-62.5-25 MCG/INH AEPB Inhale 1 puff into the lungs daily.   05/03/2019 at Unknown time  . furosemide (LASIX) 40 MG tablet Take 1 tablet (40 mg total) by mouth daily. As needed for swelling 30 tablet 11 05/03/2019 at Unknown time  . insulin glargine (LANTUS) 100 UNIT/ML injection Inject 0.4 mLs (40 Units total) into the skin daily. 24 units daily 10 mL 2 05/03/2019 at Unknown time  . insulin lispro (HUMALOG) 100 UNIT/ML injection Take by sliding scale provided by hospital maximum amount daily is 60 units.  Check blood sugar 4 times a day (Patient taking differently: Inject 0-15 Units into the skin 3 (three) times daily with meals. Take by sliding scale provided by hospital maximum amount daily is 60 units.  Check blood sugar 4 times a day) 10 mL 11 05/03/2019 at Unknown time  . ipratropium (ATROVENT) 0.02 % nebulizer solution Take 0.5 mg by nebulization 2 (two) times daily.   5 05/03/2019 at Unknown time  . levalbuterol (XOPENEX) 0.63 MG/3ML nebulizer solution Take 0.63 mg by nebulization 2 (two) times daily.   05/03/2019 at Unknown time  . LORazepam (ATIVAN) 0.5 MG tablet Take 1 tablet (0.5 mg total) by mouth 2 (two) times daily as needed for anxiety or sleep. (Patient taking differently: Take 0.5 mg by mouth 4 (four) times daily. Pt takes it three times daily.) 20 tablet 0 05/03/2019 at Unknown time  . magnesium 30 MG tablet Take 30 mg by mouth daily.   05/03/2019 at Unknown time  . metFORMIN (GLUCOPHAGE) 500 MG tablet Take 1 tablet (500 mg total) by mouth daily with breakfast. 30 tablet 3 05/03/2019 at Unknown time  . montelukast (SINGULAIR) 10 MG tablet Take 1 tablet  by mouth daily.   05/03/2019 at Unknown time  . nitroGLYCERIN (NITROSTAT) 0.4 MG SL tablet Place 1 tablet (0.4 mg total) under the tongue every 5 (five) minutes as needed for chest pain. 25 tablet 4   . omeprazole (PRILOSEC) 20 MG capsule Take 1 capsule (20 mg total) by mouth 2 (two) times daily before a meal. 180 capsule 3 05/03/2019 at Unknown time  . oxyCODONE-acetaminophen (PERCOCET) 10-325 MG tablet Take 1 tablet by mouth every 4 (four)  hours as needed for pain.   05/03/2019 at Unknown time  . potassium chloride SA (K-DUR,KLOR-CON) 20 MEQ tablet Take 1 tablet twice daily while on Lasix 60 tablet 2 05/03/2019 at Unknown time  . predniSONE (DELTASONE) 10 MG tablet Take 2 daily for 1 week then 1 daily (Patient taking differently: Take 20 mg by mouth daily with breakfast. Takes 2 tablets daily.) 60 tablet 5 05/03/2019 at Unknown time  . rifampin (RIFADIN) 300 MG capsule Take 2 capsules (600 mg total) by mouth daily. Long-term medication 60 capsule 11 05/03/2019 at Unknown time  . rosuvastatin (CRESTOR) 10 MG tablet Take 10 mg by mouth every morning.   11 05/03/2019 at Unknown time  . SYMBICORT 160-4.5 MCG/ACT inhaler INL 2 PFS PO BID   05/03/2019 at Unknown time  . tamsulosin (FLOMAX) 0.4 MG CAPS capsule Take 1 capsule (0.4 mg total) by mouth 2 (two) times daily. 60 capsule 2 05/03/2019 at Unknown time  . acetaminophen (TYLENOL) 325 MG tablet Take 2 tablets (650 mg total) by mouth every 6 (six) hours as needed for mild pain, moderate pain, fever or headache (or Fever >/= 101). (Patient not taking: Reported on 05/04/2019) 12 tablet 1 Not Taking at Unknown time   Scheduled: . azithromycin  500 mg Oral Daily  . benzonatate  200 mg Oral TID  . budesonide  0.25 mg Nebulization BID  . Clofazimine  100 mg Does not apply Daily  . diltiazem  120 mg Oral Daily  . feeding supplement (ENSURE ENLIVE)  237 mL Oral BID BM  . furosemide  80 mg Intravenous Q12H  . guaiFENesin  600 mg Oral BID  . heparin  5,000  Units Subcutaneous Q8H  . insulin aspart  0-20 Units Subcutaneous TID WC  . insulin aspart  0-5 Units Subcutaneous QHS  . insulin aspart  12 Units Subcutaneous TID WC  . insulin glargine  65 Units Subcutaneous Daily  . ipratropium  0.5 mg Nebulization BID  . levalbuterol  1.25 mg Nebulization Q6H  . loratadine  10 mg Oral Daily  . magic mouthwash  5 mL Oral TID PC & HS  . magnesium oxide  200 mg Oral Daily  . metFORMIN  500 mg Oral Q breakfast  . mometasone-formoterol  2 puff Inhalation BID  . mupirocin ointment   Nasal BID  . neomycin-bacitracin-polymyxin   Topical TID  . pantoprazole  40 mg Oral BID AC  . potassium chloride SA  40 mEq Oral Daily  . predniSONE  40 mg Oral Q breakfast  . rifampin  600 mg Oral Daily  . rosuvastatin  10 mg Oral q morning - 10a  . sodium chloride flush  3 mL Intravenous Q12H  . tamsulosin  0.4 mg Oral BID  . umeclidinium-vilanterol  1 puff Inhalation Daily   Continuous: . sodium chloride     ZOX:WRUEAV chloride, acetaminophen **OR** acetaminophen, diphenhydrAMINE, HYDROcodone-homatropine, ketorolac, levalbuterol, LORazepam, methocarbamol, nitroGLYCERIN, ondansetron **OR** ondansetron (ZOFRAN) IV, oxyCODONE-acetaminophen, polyethylene glycol, sodium chloride, sodium chloride flush, traZODone  Assesment: He was admitted with acute exacerbation of heart failure.  He is improving but slowly.  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  He now has some irritation of the toes and that is going to need to be treated.  He has thrush and that is going to need to be treated. Active Problems:   Type 2 diabetes mellitus without complication (HCC)   COPD exacerbation (HCC)   PAF (  paroxysmal atrial fibrillation) (HCC)   Chronic respiratory failure with hypoxia (HCC)   MAI (mycobacterium avium-intracellulare) (HCC)   Lower extremity edema   Vision disorder   Acute on chronic  respiratory failure with hypoxia (HCC)   GERD (gastroesophageal reflux disease)   Chronic pain   Anxiety   Dyspnea   Acute exacerbation of CHF (congestive heart failure) ([place])    Plan: Start mupirocin on his feet.  Start Magic mouthwash.  Continue other treatments.  Check labs today.    LOS: 13 days   Ryan [name] 05/17/2019, 8:47 AM

## 2019-05-18 LAB — GLUCOSE, CAPILLARY
Glucose-Capillary: 166 mg/dL — ABNORMAL HIGH (ref 70–99)
Glucose-Capillary: 295 mg/dL — ABNORMAL HIGH (ref 70–99)
Glucose-Capillary: 250 mg/dL — ABNORMAL HIGH (ref 70–99)
Glucose-Capillary: 143 mg/dL — ABNORMAL HIGH (ref 70–99)

## 2019-05-18 NOTE — Progress Notes (Signed)
Patient requesting that his leg dressing be removed.  Told the patient the importance of dressings and asked if loosening the dressings would be okay.  Patient refused and insisted that they be removed.  Dressings removed.  Will continue to monitor the patient.

## 2019-05-18 NOTE — Care Management Important Message (Signed)
Important Message  Patient Details  Name: Ryan [name] MRN: [id number] Date of Birth: 1960-08-19   Medicare Important Message Given:  Yes     [name] 05/18/2019, 11:57 AM

## 2019-05-18 NOTE — Progress Notes (Signed)
Subjective: He says he feels better.  He has less swelling of his legs and less short of breath.  Thrush may be better  Objective: Vital signs in last 24 hours: Temp:  [97.5 F (36.4 C)-98.2 F (36.8 C)] 97.5 F (36.4 C) (11/04 0536) Pulse Rate:  [105-113] 106 (11/04 0536) Resp:  [20] 20 (11/04 0536) BP: (110-137)/(69-82) 137/69 (11/04 0536) SpO2:  [94 %-100 %] 94 % (11/04 0536) Weight:  [88.9 kg] 88.9 kg (11/04 0500) Weight change: -0.6 kg Last BM Date: 05/18/19  Intake/Output from previous day: 11/03 0701 - 11/04 0700 In: 1440 [P.O.:1440] Out: 1225 [Urine:1225]  PHYSICAL EXAM General appearance: alert, cooperative and no distress Resp: rhonchi bilaterally Cardio: regular rate and rhythm, S1, S2 normal, no murmur, click, rub or gallop GI: soft, non-tender; bowel sounds normal; no masses,  no organomegaly Extremities: He now has essentially no edema of his left leg and much less of his right  Lab Results:  Results for orders placed or performed during the hospital encounter of 05/04/19 (from the past 48 hour(s))  Glucose, capillary     Status: Abnormal   Collection Time: 05/16/19 10:59 AM  Result Value Ref Range   Glucose-Capillary 374 (H) 70 - 99 mg/dL   Comment 1 Notify RN    Comment 2 Document in Chart   Glucose, capillary     Status: Abnormal   Collection Time: 05/16/19  4:51 PM  Result Value Ref Range   Glucose-Capillary 213 (H) 70 - 99 mg/dL   Comment 1 Notify RN    Comment 2 Document in Chart   Glucose, capillary     Status: Abnormal   Collection Time: 05/16/19  9:15 PM  Result Value Ref Range   Glucose-Capillary 277 (H) 70 - 99 mg/dL  Glucose, capillary     Status: Abnormal   Collection Time: 05/17/19  7:31 AM  Result Value Ref Range   Glucose-Capillary 151 (H) 70 - 99 mg/dL  CBC with Differential/Platelet     Status: Abnormal   Collection Time: 05/17/19  9:04 AM  Result Value Ref Range   WBC 8.2 4.0 - 10.5 K/uL   RBC 3.76 (L) 4.22 - 5.81 MIL/uL   Hemoglobin 10.6 (L) 13.0 - 17.0 g/dL   HCT 34.3 (L) 39.0 - 52.0 %   MCV 91.2 80.0 - 100.0 fL   MCH 28.2 26.0 - 34.0 pg   MCHC 30.9 30.0 - 36.0 g/dL   RDW 16.2 (H) 11.5 - 15.5 %   Platelets 219 150 - 400 K/uL   nRBC 0.5 (H) 0.0 - 0.2 %   Neutrophils Relative % 55 %   Neutro Abs 4.6 1.7 - 7.7 K/uL   Lymphocytes Relative 23 %   Lymphs Abs 1.9 0.7 - 4.0 K/uL   Monocytes Relative 12 %   Monocytes Absolute 1.0 0.1 - 1.0 K/uL   Eosinophils Relative 8 %   Eosinophils Absolute 0.7 (H) 0.0 - 0.5 K/uL   Basophils Relative 0 %   Basophils Absolute 0.0 0.0 - 0.1 K/uL   Immature Granulocytes 2 %   Abs Immature Granulocytes 0.13 (H) 0.00 - 0.07 K/uL    Comment: Performed at [facility], [address], [place], [place] [address]  Basic metabolic panel     Status: Abnormal   Collection Time: 05/17/19  9:04 AM  Result Value Ref Range   Sodium 133 (L) 135 - 145 mmol/L   Potassium 3.9 3.5 - 5.1 mmol/L   Chloride 95 (L) 98 - 111  mmol/L   CO2 25 22 - 32 mmol/L   Glucose, Bld 362 (H) 70 - 99 mg/dL   BUN 21 (H) 6 - 20 mg/dL   Creatinine, Ser 1.14 0.61 - 1.24 mg/dL   Calcium 8.3 (L) 8.9 - 10.3 mg/dL   GFR calc non Af Amer >60 >60 mL/min   GFR calc Af Amer >60 >60 mL/min   Anion gap 13 5 - 15    Comment: Performed at [facility], [address], [place], [place] [address]  Glucose, capillary     Status: Abnormal   Collection Time: 05/17/19 11:09 AM  Result Value Ref Range   Glucose-Capillary 403 (H) 70 - 99 mg/dL  Glucose, capillary     Status: Abnormal   Collection Time: 05/17/19  4:14 PM  Result Value Ref Range   Glucose-Capillary 190 (H) 70 - 99 mg/dL   Comment 1 Notify RN    Comment 2 Document in Chart   Glucose, capillary     Status: Abnormal   Collection Time: 05/17/19  9:27 PM  Result Value Ref Range   Glucose-Capillary 166 (H) 70 - 99 mg/dL  Glucose, capillary     Status: Abnormal   Collection Time: 05/18/19  7:28 AM  Result Value Ref Range   Glucose-Capillary 166 (H) 70 - 99  mg/dL    ABGS No results for input(s): PHART, PO2ART, TCO2, HCO3 in the last 72 hours.  Invalid input(s): PCO2 CULTURES No results found for this or any previous visit (from the past 240 hour(s)). Studies/Results: No results found.  Medications:  Prior to Admission:  Medications Prior to Admission  Medication Sig Dispense Refill Last Dose  . albuterol (VENTOLIN HFA) 108 (90 BASE) MCG/ACT inhaler Inhale 2 puffs into the lungs every 6 (six) hours as needed for wheezing or shortness of breath.     . alfuzosin (UROXATRAL) 10 MG 24 hr tablet Take 10 mg by mouth daily with breakfast.   05/03/2019 at Unknown time  . azithromycin (ZITHROMAX) 500 MG tablet 1 Tab daily per Dr Ivan Anchors medication 30 tablet 5 05/03/2019 at Unknown time  . benzonatate (TESSALON) 200 MG capsule Take 200 mg by mouth 3 (three) times daily.   05/03/2019 at Unknown time  . budesonide (PULMICORT) 0.25 MG/2ML nebulizer solution Take 2 mLs (0.25 mg total) by nebulization 2 (two) times daily. 60 mL 12 05/03/2019 at Unknown time  . CARTIA XT 120 MG 24 hr capsule Take 1 capsule (120 mg total) by mouth daily. 7 capsule 0 05/03/2019 at Unknown time  . diphenhydrAMINE (BENADRYL) 25 mg capsule Take 25 mg by mouth every 8 (eight) hours as needed. Felt mouth swelling at ED visit 08/16/18.     [name] doxycycline (VIBRAMYCIN) 100 MG capsule TK 1 C PO BID   05/03/2019 at Unknown time  . fexofenadine (ALLEGRA) 180 MG tablet Take 180 mg by mouth daily.   05/03/2019 at Unknown time  . fluticasone (FLONASE) 50 MCG/ACT nasal spray Place 2 sprays into both nostrils daily as needed.   5   . Fluticasone-Umeclidin-Vilant (TRELEGY ELLIPTA) 100-62.5-25 MCG/INH AEPB Inhale 1 puff into the lungs daily.   05/03/2019 at Unknown time  . furosemide (LASIX) 40 MG tablet Take 1 tablet (40 mg total) by mouth daily. As needed for swelling 30 tablet 11 05/03/2019 at Unknown time  . insulin glargine (LANTUS) 100 UNIT/ML injection Inject 0.4 mLs (40 Units  total) into the skin daily. 24 units daily 10 mL 2 05/03/2019 at Unknown time  . insulin  lispro (HUMALOG) 100 UNIT/ML injection Take by sliding scale provided by hospital maximum amount daily is 60 units.  Check blood sugar 4 times a day (Patient taking differently: Inject 0-15 Units into the skin 3 (three) times daily with meals. Take by sliding scale provided by hospital maximum amount daily is 60 units.  Check blood sugar 4 times a day) 10 mL 11 05/03/2019 at Unknown time  . ipratropium (ATROVENT) 0.02 % nebulizer solution Take 0.5 mg by nebulization 2 (two) times daily.   5 05/03/2019 at Unknown time  . levalbuterol (XOPENEX) 0.63 MG/3ML nebulizer solution Take 0.63 mg by nebulization 2 (two) times daily.   05/03/2019 at Unknown time  . LORazepam (ATIVAN) 0.5 MG tablet Take 1 tablet (0.5 mg total) by mouth 2 (two) times daily as needed for anxiety or sleep. (Patient taking differently: Take 0.5 mg by mouth 4 (four) times daily. Pt takes it three times daily.) 20 tablet 0 05/03/2019 at Unknown time  . magnesium 30 MG tablet Take 30 mg by mouth daily.   05/03/2019 at Unknown time  . metFORMIN (GLUCOPHAGE) 500 MG tablet Take 1 tablet (500 mg total) by mouth daily with breakfast. 30 tablet 3 05/03/2019 at Unknown time  . montelukast (SINGULAIR) 10 MG tablet Take 1 tablet by mouth daily.   05/03/2019 at Unknown time  . nitroGLYCERIN (NITROSTAT) 0.4 MG SL tablet Place 1 tablet (0.4 mg total) under the tongue every 5 (five) minutes as needed for chest pain. 25 tablet 4   . omeprazole (PRILOSEC) 20 MG capsule Take 1 capsule (20 mg total) by mouth 2 (two) times daily before a meal. 180 capsule 3 05/03/2019 at Unknown time  . oxyCODONE-acetaminophen (PERCOCET) 10-325 MG tablet Take 1 tablet by mouth every 4 (four) hours as needed for pain.   05/03/2019 at Unknown time  . potassium chloride SA (K-DUR,KLOR-CON) 20 MEQ tablet Take 1 tablet twice daily while on Lasix 60 tablet 2 05/03/2019 at Unknown time  .  predniSONE (DELTASONE) 10 MG tablet Take 2 daily for 1 week then 1 daily (Patient taking differently: Take 20 mg by mouth daily with breakfast. Takes 2 tablets daily.) 60 tablet 5 05/03/2019 at Unknown time  . rifampin (RIFADIN) 300 MG capsule Take 2 capsules (600 mg total) by mouth daily. Long-term medication 60 capsule 11 05/03/2019 at Unknown time  . rosuvastatin (CRESTOR) 10 MG tablet Take 10 mg by mouth every morning.   11 05/03/2019 at Unknown time  . SYMBICORT 160-4.5 MCG/ACT inhaler INL 2 PFS PO BID   05/03/2019 at Unknown time  . tamsulosin (FLOMAX) 0.4 MG CAPS capsule Take 1 capsule (0.4 mg total) by mouth 2 (two) times daily. 60 capsule 2 05/03/2019 at Unknown time  . acetaminophen (TYLENOL) 325 MG tablet Take 2 tablets (650 mg total) by mouth every 6 (six) hours as needed for mild pain, moderate pain, fever or headache (or Fever >/= 101). (Patient not taking: Reported on 05/04/2019) 12 tablet 1 Not Taking at Unknown time   Scheduled: . azithromycin  500 mg Oral Daily  . benzonatate  200 mg Oral TID  . budesonide  0.25 mg Nebulization BID  . Clofazimine  100 mg Does not apply Daily  . diltiazem  120 mg Oral Daily  . feeding supplement (ENSURE ENLIVE)  237 mL Oral BID BM  . furosemide  80 mg Intravenous Q12H  . guaiFENesin  600 mg Oral BID  . heparin  5,000 Units Subcutaneous Q8H  . insulin aspart  0-20 Units  Subcutaneous TID WC  . insulin aspart  0-5 Units Subcutaneous QHS  . insulin aspart  18 Units Subcutaneous TID WC  . insulin glargine  65 Units Subcutaneous Daily  . ipratropium  0.5 mg Nebulization BID  . levalbuterol  1.25 mg Nebulization Q6H  . loratadine  10 mg Oral Daily  . magic mouthwash  5 mL Oral TID PC & HS  . magnesium oxide  200 mg Oral Daily  . metFORMIN  500 mg Oral Q breakfast  . mometasone-formoterol  2 puff Inhalation BID  . mupirocin ointment   Nasal BID  . neomycin-bacitracin-polymyxin   Topical TID  . pantoprazole  40 mg Oral BID AC  . potassium  chloride SA  40 mEq Oral Daily  . predniSONE  40 mg Oral Q breakfast  . rifampin  600 mg Oral Daily  . rosuvastatin  10 mg Oral q morning - 10a  . sodium chloride flush  3 mL Intravenous Q12H  . tamsulosin  0.4 mg Oral BID  . umeclidinium-vilanterol  1 puff Inhalation Daily   Continuous: . sodium chloride     YOV:ZCHYIF chloride, acetaminophen **OR** acetaminophen, diphenhydrAMINE, HYDROcodone-homatropine, ketorolac, levalbuterol, LORazepam, methocarbamol, nitroGLYCERIN, ondansetron **OR** ondansetron (ZOFRAN) IV, oxyCODONE-acetaminophen, polyethylene glycol, sodium chloride, sodium chloride flush, traZODone  Assesment: He was admitted with acute exacerbation of CHF.  He is improving slowly.  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  He has chronic pain which is stable  He has diabetes and I adjusted his insulin yesterday Active Problems:   Type 2 diabetes mellitus without complication (HCC)   COPD exacerbation (HCC)   PAF (paroxysmal atrial fibrillation) (HCC)   Chronic respiratory failure with hypoxia (HCC)   MAI (mycobacterium avium-intracellulare) (HCC)   Lower extremity edema   Vision disorder   Acute on chronic respiratory failure with hypoxia (HCC)   GERD (gastroesophageal reflux disease)   Chronic pain   Anxiety   Dyspnea   Acute exacerbation of CHF (congestive heart failure) ([place])    Plan: Continue current treatments.  He is definitely better today    LOS: 14 days   Alonza Bogus 05/18/2019, 8:46 AM

## 2019-05-18 NOTE — Progress Notes (Signed)
Nutrition Follow-up  Nutrition Brief Note   Per chart review, less bilateral lower extremity edema and acute CHF exacerbation slowly improving; on 6L O2. Plans to continue current treatments. PT recommending SNF at discharge.    Patient endorses continued good appetite and intake  He is on a heart healthy diet; consistently eating 75-100% of meals and drinking nutrition supplement. Will continue to follow.   Current wt 88.9 kg (196 lb) +4 RLE; +2 LLE edema per review of RN assessment on 11/03 Admit wt 83.4 kg (183.9 lb) Weight trends likely due to fluid status Labs and medications reviewed.   Lajuan Lines, [place], [place] [facility] [phone] After Hours/Weekend Pager: [phone]

## 2019-05-19 LAB — GLUCOSE, CAPILLARY
Glucose-Capillary: 135 mg/dL — ABNORMAL HIGH (ref 70–99)
Glucose-Capillary: 268 mg/dL — ABNORMAL HIGH (ref 70–99)
Glucose-Capillary: 177 mg/dL — ABNORMAL HIGH (ref 70–99)
Glucose-Capillary: 227 mg/dL — ABNORMAL HIGH (ref 70–99)

## 2019-05-19 MED ORDER — LORAZEPAM 2 MG/ML IJ SOLN
0.5000 mg | Freq: Once | INTRAMUSCULAR | Status: AC
  Administered 2019-05-19: 0.5 mg via INTRAVENOUS
  Filled 2019-05-19: qty 1

## 2019-05-19 MED ORDER — PREDNISONE 20 MG PO TABS
30.0000 mg | ORAL_TABLET | Freq: Every day | ORAL | Status: DC
  Administered 2019-05-20 – 2019-05-26 (×7): 30 mg via ORAL
  Filled 2019-05-19 (×7): qty 1

## 2019-05-19 MED ORDER — FUROSEMIDE 80 MG PO TABS
80.0000 mg | ORAL_TABLET | Freq: Two times a day (BID) | ORAL | Status: DC
  Administered 2019-05-19 – 2019-05-29 (×21): 80 mg via ORAL
  Filled 2019-05-19 (×21): qty 1

## 2019-05-19 NOTE — Progress Notes (Signed)
Rapid Response Event Note  Overview:  Rapid response called. Patient was trying to get up and use the restroom. Pt reports sudden onset of SOB. Patient was able to flag down environmental services who alerted nursing staff. Izora Gala, RN, Caryl Pina, RN, and Jonelle Sidle, SWOT RN at bedside.   Initial Focused Assessment: Patient sitting on side of bed in tripod position with noted respiratory distress. RR 42 with audible wheezing noted and use of chest and abdominal excessory muscles. Patient able to speak only 1-2 words at a time with great effort. Vital signs obtained at 1400 during initial assessment are as follows: BP 166/123, RR 42, HR 145, Oxygen Sats 92% on 6 L [place].  Interventions:  Pt given xopenex nebulizer treatment started by RT 1410.  Ativan 0.5 mg given 1414 by Orie Rout, RN per Dr Denton Brick verbal order  Plan of Care (if not transferred): Continue to monitor per orders. No new medication orders at this time.   Event Summary:  Rapid response called at   1357    Dr Denton Brick and RT at bedside at  [place]   Dr Luan Pulling page at [phone] and responded at 1407.    After ativan and xopenex, pt respiratory rate decreased to 21 and WOB decreased significantly. Pt able to speak full sentences and reports feeling much better. Vital signs obtained 1409 and are as followed, BP 121/73, HR 125, RR 21, Oxygen saturations 99% 6 L [place].   Jeanice Lim M

## 2019-05-19 NOTE — Progress Notes (Signed)
Subjective: He is overall about the same.  Problem with the Ace wraps noted.  He agrees to wear them today  Objective: Vital signs in last 24 hours: Temp:  [97.7 F (36.5 C)-98.1 F (36.7 C)] 98 F (36.7 C) (11/05 0520) Pulse Rate:  [96-117] 96 (11/05 0520) Resp:  [20-22] 20 (11/05 0520) BP: (109-123)/(73-80) 109/77 (11/05 0520) SpO2:  [92 %-99 %] 95 % (11/05 0723) Weight:  [89.8 kg] 89.8 kg (11/05 0500) Weight change: 0.9 kg Last BM Date: 05/18/19  Intake/Output from previous day: 11/04 0701 - 11/05 0700 In: 1200 [P.O.:1200] Out: 3400 [Urine:3400]  PHYSICAL EXAM General appearance: alert and no distress Resp: clear to auscultation bilaterally Cardio: regular rate and rhythm, S1, S2 normal, no murmur, click, rub or gallop GI: soft, non-tender; bowel sounds normal; no masses,  no organomegaly Extremities: He still has swelling mostly of his foot on the right  Lab Results:  Results for orders placed or performed during the hospital encounter of 05/04/19 (from the past 48 hour(s))  CBC with Differential/Platelet     Status: Abnormal   Collection Time: 05/17/19  9:04 AM  Result Value Ref Range   WBC 8.2 4.0 - 10.5 K/uL   RBC 3.76 (L) 4.22 - 5.81 MIL/uL   Hemoglobin 10.6 (L) 13.0 - 17.0 g/dL   HCT 34.3 (L) 39.0 - 52.0 %   MCV 91.2 80.0 - 100.0 fL   MCH 28.2 26.0 - 34.0 pg   MCHC 30.9 30.0 - 36.0 g/dL   RDW 16.2 (H) 11.5 - 15.5 %   Platelets 219 150 - 400 K/uL   nRBC 0.5 (H) 0.0 - 0.2 %   Neutrophils Relative % 55 %   Neutro Abs 4.6 1.7 - 7.7 K/uL   Lymphocytes Relative 23 %   Lymphs Abs 1.9 0.7 - 4.0 K/uL   Monocytes Relative 12 %   Monocytes Absolute 1.0 0.1 - 1.0 K/uL   Eosinophils Relative 8 %   Eosinophils Absolute 0.7 (H) 0.0 - 0.5 K/uL   Basophils Relative 0 %   Basophils Absolute 0.0 0.0 - 0.1 K/uL   Immature Granulocytes 2 %   Abs Immature Granulocytes 0.13 (H) 0.00 - 0.07 K/uL    Comment: Performed at [facility], [address] [place], [place] [address]   Basic metabolic panel     Status: Abnormal   Collection Time: 05/17/19  9:04 AM  Result Value Ref Range   Sodium 133 (L) 135 - 145 mmol/L   Potassium 3.9 3.5 - 5.1 mmol/L   Chloride 95 (L) 98 - 111 mmol/L   CO2 25 22 - 32 mmol/L   Glucose, Bld 362 (H) 70 - 99 mg/dL   BUN 21 (H) 6 - 20 mg/dL   Creatinine, Ser 1.14 0.61 - 1.24 mg/dL   Calcium 8.3 (L) 8.9 - 10.3 mg/dL   GFR calc non Af Amer >60 >60 mL/min   GFR calc Af Amer >60 >60 mL/min   Anion gap 13 5 - 15    Comment: Performed at [facility], [address], [place], [place] [address]  Glucose, capillary     Status: Abnormal   Collection Time: 05/17/19 11:09 AM  Result Value Ref Range   Glucose-Capillary 403 (H) 70 - 99 mg/dL  Glucose, capillary     Status: Abnormal   Collection Time: 05/17/19  4:14 PM  Result Value Ref Range   Glucose-Capillary 190 (H) 70 - 99 mg/dL   Comment 1 Notify RN    Comment  2 Document in Chart   Glucose, capillary     Status: Abnormal   Collection Time: 05/17/19  9:27 PM  Result Value Ref Range   Glucose-Capillary 166 (H) 70 - 99 mg/dL  Glucose, capillary     Status: Abnormal   Collection Time: 05/18/19  7:28 AM  Result Value Ref Range   Glucose-Capillary 166 (H) 70 - 99 mg/dL  Glucose, capillary     Status: Abnormal   Collection Time: 05/18/19 11:12 AM  Result Value Ref Range   Glucose-Capillary 295 (H) 70 - 99 mg/dL  Glucose, capillary     Status: Abnormal   Collection Time: 05/18/19  4:11 PM  Result Value Ref Range   Glucose-Capillary 250 (H) 70 - 99 mg/dL   Comment 1 Notify RN    Comment 2 Document in Chart   Glucose, capillary     Status: Abnormal   Collection Time: 05/18/19  8:32 PM  Result Value Ref Range   Glucose-Capillary 143 (H) 70 - 99 mg/dL  Glucose, capillary     Status: Abnormal   Collection Time: 05/19/19  7:47 AM  Result Value Ref Range   Glucose-Capillary 135 (H) 70 - 99 mg/dL    ABGS No results for input(s): PHART, PO2ART, TCO2, HCO3 in the last 72  hours.  Invalid input(s): PCO2 CULTURES No results found for this or any previous visit (from the past 240 hour(s)). Studies/Results: No results found.  Medications:  Prior to Admission:  Medications Prior to Admission  Medication Sig Dispense Refill Last Dose  . albuterol (VENTOLIN HFA) 108 (90 BASE) MCG/ACT inhaler Inhale 2 puffs into the lungs every 6 (six) hours as needed for wheezing or shortness of breath.     . alfuzosin (UROXATRAL) 10 MG 24 hr tablet Take 10 mg by mouth daily with breakfast.   05/03/2019 at Unknown time  . azithromycin (ZITHROMAX) 500 MG tablet 1 Tab daily per Dr Ivan Anchors medication 30 tablet 5 05/03/2019 at Unknown time  . benzonatate (TESSALON) 200 MG capsule Take 200 mg by mouth 3 (three) times daily.   05/03/2019 at Unknown time  . budesonide (PULMICORT) 0.25 MG/2ML nebulizer solution Take 2 mLs (0.25 mg total) by nebulization 2 (two) times daily. 60 mL 12 05/03/2019 at Unknown time  . CARTIA XT 120 MG 24 hr capsule Take 1 capsule (120 mg total) by mouth daily. 7 capsule 0 05/03/2019 at Unknown time  . diphenhydrAMINE (BENADRYL) 25 mg capsule Take 25 mg by mouth every 8 (eight) hours as needed. Felt mouth swelling at ED visit 08/16/18.     [name] doxycycline (VIBRAMYCIN) 100 MG capsule TK 1 C PO BID   05/03/2019 at Unknown time  . fexofenadine (ALLEGRA) 180 MG tablet Take 180 mg by mouth daily.   05/03/2019 at Unknown time  . fluticasone (FLONASE) 50 MCG/ACT nasal spray Place 2 sprays into both nostrils daily as needed.   5   . Fluticasone-Umeclidin-Vilant (TRELEGY ELLIPTA) 100-62.5-25 MCG/INH AEPB Inhale 1 puff into the lungs daily.   05/03/2019 at Unknown time  . furosemide (LASIX) 40 MG tablet Take 1 tablet (40 mg total) by mouth daily. As needed for swelling 30 tablet 11 05/03/2019 at Unknown time  . insulin glargine (LANTUS) 100 UNIT/ML injection Inject 0.4 mLs (40 Units total) into the skin daily. 24 units daily 10 mL 2 05/03/2019 at Unknown time  .  insulin lispro (HUMALOG) 100 UNIT/ML injection Take by sliding scale provided by hospital maximum amount daily is 60 units.  Check blood  sugar 4 times a day (Patient taking differently: Inject 0-15 Units into the skin 3 (three) times daily with meals. Take by sliding scale provided by hospital maximum amount daily is 60 units.  Check blood sugar 4 times a day) 10 mL 11 05/03/2019 at Unknown time  . ipratropium (ATROVENT) 0.02 % nebulizer solution Take 0.5 mg by nebulization 2 (two) times daily.   5 05/03/2019 at Unknown time  . levalbuterol (XOPENEX) 0.63 MG/3ML nebulizer solution Take 0.63 mg by nebulization 2 (two) times daily.   05/03/2019 at Unknown time  . LORazepam (ATIVAN) 0.5 MG tablet Take 1 tablet (0.5 mg total) by mouth 2 (two) times daily as needed for anxiety or sleep. (Patient taking differently: Take 0.5 mg by mouth 4 (four) times daily. Pt takes it three times daily.) 20 tablet 0 05/03/2019 at Unknown time  . magnesium 30 MG tablet Take 30 mg by mouth daily.   05/03/2019 at Unknown time  . metFORMIN (GLUCOPHAGE) 500 MG tablet Take 1 tablet (500 mg total) by mouth daily with breakfast. 30 tablet 3 05/03/2019 at Unknown time  . montelukast (SINGULAIR) 10 MG tablet Take 1 tablet by mouth daily.   05/03/2019 at Unknown time  . nitroGLYCERIN (NITROSTAT) 0.4 MG SL tablet Place 1 tablet (0.4 mg total) under the tongue every 5 (five) minutes as needed for chest pain. 25 tablet 4   . omeprazole (PRILOSEC) 20 MG capsule Take 1 capsule (20 mg total) by mouth 2 (two) times daily before a meal. 180 capsule 3 05/03/2019 at Unknown time  . oxyCODONE-acetaminophen (PERCOCET) 10-325 MG tablet Take 1 tablet by mouth every 4 (four) hours as needed for pain.   05/03/2019 at Unknown time  . potassium chloride SA (K-DUR,KLOR-CON) 20 MEQ tablet Take 1 tablet twice daily while on Lasix 60 tablet 2 05/03/2019 at Unknown time  . predniSONE (DELTASONE) 10 MG tablet Take 2 daily for 1 week then 1 daily (Patient  taking differently: Take 20 mg by mouth daily with breakfast. Takes 2 tablets daily.) 60 tablet 5 05/03/2019 at Unknown time  . rifampin (RIFADIN) 300 MG capsule Take 2 capsules (600 mg total) by mouth daily. Long-term medication 60 capsule 11 05/03/2019 at Unknown time  . rosuvastatin (CRESTOR) 10 MG tablet Take 10 mg by mouth every morning.   11 05/03/2019 at Unknown time  . SYMBICORT 160-4.5 MCG/ACT inhaler INL 2 PFS PO BID   05/03/2019 at Unknown time  . tamsulosin (FLOMAX) 0.4 MG CAPS capsule Take 1 capsule (0.4 mg total) by mouth 2 (two) times daily. 60 capsule 2 05/03/2019 at Unknown time  . acetaminophen (TYLENOL) 325 MG tablet Take 2 tablets (650 mg total) by mouth every 6 (six) hours as needed for mild pain, moderate pain, fever or headache (or Fever >/= 101). (Patient not taking: Reported on 05/04/2019) 12 tablet 1 Not Taking at Unknown time   Scheduled: . azithromycin  500 mg Oral Daily  . benzonatate  200 mg Oral TID  . budesonide  0.25 mg Nebulization BID  . Clofazimine  100 mg Does not apply Daily  . diltiazem  120 mg Oral Daily  . feeding supplement (ENSURE ENLIVE)  237 mL Oral BID BM  . furosemide  80 mg Oral BID  . guaiFENesin  600 mg Oral BID  . heparin  5,000 Units Subcutaneous Q8H  . insulin aspart  0-20 Units Subcutaneous TID WC  . insulin aspart  0-5 Units Subcutaneous QHS  . insulin aspart  18 Units Subcutaneous TID  WC  . insulin glargine  65 Units Subcutaneous Daily  . ipratropium  0.5 mg Nebulization BID  . levalbuterol  1.25 mg Nebulization Q6H  . loratadine  10 mg Oral Daily  . magic mouthwash  5 mL Oral TID PC & HS  . magnesium oxide  200 mg Oral Daily  . metFORMIN  500 mg Oral Q breakfast  . mometasone-formoterol  2 puff Inhalation BID  . mupirocin ointment   Nasal BID  . neomycin-bacitracin-polymyxin   Topical TID  . pantoprazole  40 mg Oral BID AC  . potassium chloride SA  40 mEq Oral Daily  . predniSONE  40 mg Oral Q breakfast  . rifampin  600 mg Oral  Daily  . rosuvastatin  10 mg Oral q morning - 10a  . sodium chloride flush  3 mL Intravenous Q12H  . tamsulosin  0.4 mg Oral BID  . umeclidinium-vilanterol  1 puff Inhalation Daily   Continuous: . sodium chloride     TKP:TWSFKC chloride, acetaminophen **OR** acetaminophen, diphenhydrAMINE, HYDROcodone-homatropine, ketorolac, levalbuterol, LORazepam, methocarbamol, nitroGLYCERIN, ondansetron **OR** ondansetron (ZOFRAN) IV, oxyCODONE-acetaminophen, polyethylene glycol, sodium chloride, sodium chloride flush, traZODone  Assesment: He was admitted with acute exacerbation of congestive heart failure.  He is improving.  He mostly has edema of his foot now and I do not think he has any pulmonary edema.  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  Reduce prednisone.    LOS: 15 days   Alonza Bogus 05/19/2019, 8:34 AM

## 2019-05-19 NOTE — Progress Notes (Signed)
Around 1400 had respiratory distress stating that he got up rapidly to use bathroom.  Rapid response called and received neb treatment as well as prn ativan.  Vitals stabilized and patient was talking and interacting with staff after receiving interventions.  Dr. Luan Pulling called back and was made aware of situation.  Dr. Denton Brick was present for rapid.

## 2019-05-19 NOTE — Progress Notes (Signed)
Events noted. My thanks to Dr. Denton Brick. Seems to be back to baseline

## 2019-05-20 LAB — GLUCOSE, CAPILLARY
Glucose-Capillary: 191 mg/dL — ABNORMAL HIGH (ref 70–99)
Glucose-Capillary: 229 mg/dL — ABNORMAL HIGH (ref 70–99)
Glucose-Capillary: 174 mg/dL — ABNORMAL HIGH (ref 70–99)
Glucose-Capillary: 84 mg/dL (ref 70–99)

## 2019-05-20 NOTE — Progress Notes (Signed)
Subjective: He says he feels better than yesterday.  He had rapid response called because of shortness of breath.  He was standing up to urinate and developed increased shortness of breath tachycardia and tachypnea.  He feels basically back to baseline now.  Objective: Vital signs in last 24 hours: Temp:  [98.1 F (36.7 C)-98.5 F (36.9 C)] 98.5 F (36.9 C) (11/06 0624) Pulse Rate:  [77-106] 102 (11/06 0624) Resp:  [18-20] 20 (11/05 2100) BP: (103-115)/(59-67) 114/65 (11/06 0624) SpO2:  [95 %-98 %] 95 % (11/06 0729) Weight:  [90.2 kg] 90.2 kg (11/06 0624) Weight change: 0.4 kg Last BM Date: 05/19/19  Intake/Output from previous day: 11/05 0701 - 11/06 0700 In: 480 [P.O.:480] Out: 650 [Urine:650]  PHYSICAL EXAM General appearance: alert, cooperative and no distress Resp: He has more rhonchi than yesterday Cardio: regular rate and rhythm, S1, S2 normal, no murmur, click, rub or gallop GI: soft, non-tender; bowel sounds normal; no masses,  no organomegaly Extremities: He still has swelling of his legs but it is better  Lab Results:  Results for orders placed or performed during the hospital encounter of 05/04/19 (from the past 48 hour(s))  Glucose, capillary     Status: Abnormal   Collection Time: 05/18/19 11:12 AM  Result Value Ref Range   Glucose-Capillary 295 (H) 70 - 99 mg/dL  Glucose, capillary     Status: Abnormal   Collection Time: 05/18/19  4:11 PM  Result Value Ref Range   Glucose-Capillary 250 (H) 70 - 99 mg/dL   Comment 1 Notify RN    Comment 2 Document in Chart   Glucose, capillary     Status: Abnormal   Collection Time: 05/18/19  8:32 PM  Result Value Ref Range   Glucose-Capillary 143 (H) 70 - 99 mg/dL  Glucose, capillary     Status: Abnormal   Collection Time: 05/19/19  7:47 AM  Result Value Ref Range   Glucose-Capillary 135 (H) 70 - 99 mg/dL  Glucose, capillary     Status: Abnormal   Collection Time: 05/19/19 11:52 AM  Result Value Ref Range    Glucose-Capillary 268 (H) 70 - 99 mg/dL  Glucose, capillary     Status: Abnormal   Collection Time: 05/19/19  4:13 PM  Result Value Ref Range   Glucose-Capillary 177 (H) 70 - 99 mg/dL  Glucose, capillary     Status: Abnormal   Collection Time: 05/19/19  8:54 PM  Result Value Ref Range   Glucose-Capillary 227 (H) 70 - 99 mg/dL   Comment 1 Notify RN    Comment 2 Document in Chart     ABGS No results for input(s): PHART, PO2ART, TCO2, HCO3 in the last 72 hours.  Invalid input(s): PCO2 CULTURES No results found for this or any previous visit (from the past 240 hour(s)). Studies/Results: No results found.  Medications:  Prior to Admission:  Medications Prior to Admission  Medication Sig Dispense Refill Last Dose  . albuterol (VENTOLIN HFA) 108 (90 BASE) MCG/ACT inhaler Inhale 2 puffs into the lungs every 6 (six) hours as needed for wheezing or shortness of breath.     . alfuzosin (UROXATRAL) 10 MG 24 hr tablet Take 10 mg by mouth daily with breakfast.   05/03/2019 at Unknown time  . azithromycin (ZITHROMAX) 500 MG tablet 1 Tab daily per Dr Ivan Anchors medication 30 tablet 5 05/03/2019 at Unknown time  . benzonatate (TESSALON) 200 MG capsule Take 200 mg by mouth 3 (three) times daily.   05/03/2019 at Unknown  time  . budesonide (PULMICORT) 0.25 MG/2ML nebulizer solution Take 2 mLs (0.25 mg total) by nebulization 2 (two) times daily. 60 mL 12 05/03/2019 at Unknown time  . CARTIA XT 120 MG 24 hr capsule Take 1 capsule (120 mg total) by mouth daily. 7 capsule 0 05/03/2019 at Unknown time  . diphenhydrAMINE (BENADRYL) 25 mg capsule Take 25 mg by mouth every 8 (eight) hours as needed. Felt mouth swelling at ED visit 08/16/18.     [name] doxycycline (VIBRAMYCIN) 100 MG capsule TK 1 C PO BID   05/03/2019 at Unknown time  . fexofenadine (ALLEGRA) 180 MG tablet Take 180 mg by mouth daily.   05/03/2019 at Unknown time  . fluticasone (FLONASE) 50 MCG/ACT nasal spray Place 2 sprays into both  nostrils daily as needed.   5   . Fluticasone-Umeclidin-Vilant (TRELEGY ELLIPTA) 100-62.5-25 MCG/INH AEPB Inhale 1 puff into the lungs daily.   05/03/2019 at Unknown time  . furosemide (LASIX) 40 MG tablet Take 1 tablet (40 mg total) by mouth daily. As needed for swelling 30 tablet 11 05/03/2019 at Unknown time  . insulin glargine (LANTUS) 100 UNIT/ML injection Inject 0.4 mLs (40 Units total) into the skin daily. 24 units daily 10 mL 2 05/03/2019 at Unknown time  . insulin lispro (HUMALOG) 100 UNIT/ML injection Take by sliding scale provided by hospital maximum amount daily is 60 units.  Check blood sugar 4 times a day (Patient taking differently: Inject 0-15 Units into the skin 3 (three) times daily with meals. Take by sliding scale provided by hospital maximum amount daily is 60 units.  Check blood sugar 4 times a day) 10 mL 11 05/03/2019 at Unknown time  . ipratropium (ATROVENT) 0.02 % nebulizer solution Take 0.5 mg by nebulization 2 (two) times daily.   5 05/03/2019 at Unknown time  . levalbuterol (XOPENEX) 0.63 MG/3ML nebulizer solution Take 0.63 mg by nebulization 2 (two) times daily.   05/03/2019 at Unknown time  . LORazepam (ATIVAN) 0.5 MG tablet Take 1 tablet (0.5 mg total) by mouth 2 (two) times daily as needed for anxiety or sleep. (Patient taking differently: Take 0.5 mg by mouth 4 (four) times daily. Pt takes it three times daily.) 20 tablet 0 05/03/2019 at Unknown time  . magnesium 30 MG tablet Take 30 mg by mouth daily.   05/03/2019 at Unknown time  . metFORMIN (GLUCOPHAGE) 500 MG tablet Take 1 tablet (500 mg total) by mouth daily with breakfast. 30 tablet 3 05/03/2019 at Unknown time  . montelukast (SINGULAIR) 10 MG tablet Take 1 tablet by mouth daily.   05/03/2019 at Unknown time  . nitroGLYCERIN (NITROSTAT) 0.4 MG SL tablet Place 1 tablet (0.4 mg total) under the tongue every 5 (five) minutes as needed for chest pain. 25 tablet 4   . omeprazole (PRILOSEC) 20 MG capsule Take 1 capsule  (20 mg total) by mouth 2 (two) times daily before a meal. 180 capsule 3 05/03/2019 at Unknown time  . oxyCODONE-acetaminophen (PERCOCET) 10-325 MG tablet Take 1 tablet by mouth every 4 (four) hours as needed for pain.   05/03/2019 at Unknown time  . potassium chloride SA (K-DUR,KLOR-CON) 20 MEQ tablet Take 1 tablet twice daily while on Lasix 60 tablet 2 05/03/2019 at Unknown time  . predniSONE (DELTASONE) 10 MG tablet Take 2 daily for 1 week then 1 daily (Patient taking differently: Take 20 mg by mouth daily with breakfast. Takes 2 tablets daily.) 60 tablet 5 05/03/2019 at Unknown time  . rifampin (RIFADIN) 300  MG capsule Take 2 capsules (600 mg total) by mouth daily. Long-term medication 60 capsule 11 05/03/2019 at Unknown time  . rosuvastatin (CRESTOR) 10 MG tablet Take 10 mg by mouth every morning.   11 05/03/2019 at Unknown time  . SYMBICORT 160-4.5 MCG/ACT inhaler INL 2 PFS PO BID   05/03/2019 at Unknown time  . tamsulosin (FLOMAX) 0.4 MG CAPS capsule Take 1 capsule (0.4 mg total) by mouth 2 (two) times daily. 60 capsule 2 05/03/2019 at Unknown time  . acetaminophen (TYLENOL) 325 MG tablet Take 2 tablets (650 mg total) by mouth every 6 (six) hours as needed for mild pain, moderate pain, fever or headache (or Fever >/= 101). (Patient not taking: Reported on 05/04/2019) 12 tablet 1 Not Taking at Unknown time   Scheduled: . azithromycin  500 mg Oral Daily  . benzonatate  200 mg Oral TID  . budesonide  0.25 mg Nebulization BID  . Clofazimine  100 mg Does not apply Daily  . diltiazem  120 mg Oral Daily  . feeding supplement (ENSURE ENLIVE)  237 mL Oral BID BM  . furosemide  80 mg Oral BID  . guaiFENesin  600 mg Oral BID  . heparin  5,000 Units Subcutaneous Q8H  . insulin aspart  0-20 Units Subcutaneous TID WC  . insulin aspart  0-5 Units Subcutaneous QHS  . insulin aspart  18 Units Subcutaneous TID WC  . insulin glargine  65 Units Subcutaneous Daily  . ipratropium  0.5 mg Nebulization BID  .  levalbuterol  1.25 mg Nebulization Q6H  . loratadine  10 mg Oral Daily  . magic mouthwash  5 mL Oral TID PC & HS  . magnesium oxide  200 mg Oral Daily  . metFORMIN  500 mg Oral Q breakfast  . mometasone-formoterol  2 puff Inhalation BID  . mupirocin ointment   Nasal BID  . neomycin-bacitracin-polymyxin   Topical TID  . pantoprazole  40 mg Oral BID AC  . potassium chloride SA  40 mEq Oral Daily  . predniSONE  30 mg Oral Q breakfast  . rifampin  600 mg Oral Daily  . rosuvastatin  10 mg Oral q morning - 10a  . sodium chloride flush  3 mL Intravenous Q12H  . tamsulosin  0.4 mg Oral BID  . umeclidinium-vilanterol  1 puff Inhalation Daily   Continuous: . sodium chloride     ZDG:LOVFIE chloride, acetaminophen **OR** acetaminophen, diphenhydrAMINE, HYDROcodone-homatropine, levalbuterol, LORazepam, methocarbamol, nitroGLYCERIN, ondansetron **OR** ondansetron (ZOFRAN) IV, oxyCODONE-acetaminophen, polyethylene glycol, sodium chloride, sodium chloride flush, traZODone  Assesment: He was admitted with acute exacerbation of CHF and acute on chronic hypoxic respiratory failure.  He had rapid response called yesterday because of increased shortness of breath.  He is back to baseline now.  He has severe COPD at baseline and is on steroids and inhaled bronchodilators  He has pulmonary Mycobacterium and is on treatment for that Active Problems:   Type 2 diabetes mellitus without complication (HCC)   COPD exacerbation (HCC)   PAF (paroxysmal atrial fibrillation) (HCC)   Chronic respiratory failure with hypoxia (HCC)   MAI (mycobacterium avium-intracellulare) (HCC)   Lower extremity edema   Vision disorder   Acute on chronic respiratory failure with hypoxia (HCC)   GERD (gastroesophageal reflux disease)   Chronic pain   Anxiety   Dyspnea   Acute exacerbation of CHF (congestive heart failure) ([place])    Plan: Continue treatments.  Not ready for discharge    LOS: 16 days   Percell Miller  L  Pratyush Ammon 05/20/2019, 8:46 AM

## 2019-05-20 NOTE — TOC Progression Note (Signed)
Transition of Care [facility]) - Progression Note    Patient Details  Name: Ryan [name] MRN: [id number] Date of Birth: 1960-10-17  [facility] CM/SW Contact  Ihor Gully, LCSW Phone Number: 05/20/2019, 1:20 PM  Clinical Narrative:    TOC continues to follow patient.   Expected Discharge Plan: [place] Barriers to Discharge: Continued Medical Work up  Expected Discharge Plan and Services Expected Discharge Plan: [place] In-house Referral: Clinical Social Work     Living arrangements for the past 2 months: Mobile Home                           HH Arranged: PT, RN [facility] Agency: [place] ([facility]) Date HH Agency Contacted: 05/16/19 Time [place]: [place] Representative spoke with at [place]: [place] ([place]) Interventions    Readmission Risk Interventions Readmission Risk Prevention Plan 05/06/2019  Transportation Screening Complete  Medication Review [employer] photographer) Complete  PCP or Specialist appointment within 3-5 days of discharge Not Complete  PCP/Specialist Appt Not Complete comments Dr. Luan Pulling staff schedule their own appointments for hospital dc follow up  [place] or [place] Patient refused  SW Recovery Care/Counseling Consult Complete  Skilled [place] Not Applicable  Some recent data might be hidden

## 2019-05-20 NOTE — Care Management Important Message (Signed)
Important Message  Patient Details  Name: Ryan [name] MRN: [id number] Date of Birth: August 14, 1960   Medicare Important Message Given:  Yes     [name] 05/20/2019, 3:22 PM

## 2019-05-20 NOTE — Progress Notes (Signed)
PT Cancellation Note  Patient Details Name: Ryan [name] MRN: [id number] DOB: 10/11/1960   Cancelled Treatment:    Reason Eval/Treat Not Completed: Patient declined, no reason specified.  Patient declined therapy secondary to c/o difficulty breathing.   11:46 AM, 05/20/19 Lonell Grandchild, MPT Physical Therapist with [facility] 336 [phone] office [phone] mobile phone

## 2019-05-21 LAB — GLUCOSE, CAPILLARY
Glucose-Capillary: 164 mg/dL — ABNORMAL HIGH (ref 70–99)
Glucose-Capillary: 239 mg/dL — ABNORMAL HIGH (ref 70–99)
Glucose-Capillary: 163 mg/dL — ABNORMAL HIGH (ref 70–99)
Glucose-Capillary: 97 mg/dL (ref 70–99)

## 2019-05-21 NOTE — Progress Notes (Signed)
Thigh high ted hose applied to right leg per MD order. Leg cleaned, mupirocin ointment applied to abraided areas, leg then wrapped with casting padding, then ted hose applied.Pt tolerated well.

## 2019-05-21 NOTE — Progress Notes (Signed)
Subjective: He says he feels fair.  He had another episode of being more short of breath last night.  No other new complaints.  He still has swelling of his right leg and I think that may be venous insufficiency rather than so much his heart failure now.  He did better when he was consistently getting the Ace wraps.  We discussed trying a compression stocking but he said they broke him out before.  Were going to try to wrap him with gauze and then place thigh-high TED hose and see if that will help.  Objective: Vital signs in last 24 hours: Temp:  [98.2 F (36.8 C)-98.5 F (36.9 C)] 98.2 F (36.8 C) (11/07 0539) Pulse Rate:  [104-106] 106 (11/07 0539) Resp:  [20] 20 (11/07 0539) BP: (101-105)/(64-74) 105/74 (11/07 0539) SpO2:  [95 %-100 %] 100 % (11/07 0828) Weight:  [86.6 kg] 86.6 kg (11/07 0539) Weight change: -3.6 kg Last BM Date: 05/19/19  Intake/Output from previous day: 11/06 0701 - 11/07 0700 In: 320 [P.O.:320] Out: 2550 [Urine:2550]  PHYSICAL EXAM General appearance: alert, cooperative and no distress Resp: rhonchi bilaterally Cardio: regular rate and rhythm, S1, S2 normal, no murmur, click, rub or gallop GI: soft, non-tender; bowel sounds normal; no masses,  no organomegaly Extremities: He still has edema of his right greater than left leg  Lab Results:  Results for orders placed or performed during the hospital encounter of 05/04/19 (from the past 48 hour(s))  Glucose, capillary     Status: Abnormal   Collection Time: 05/19/19 11:52 AM  Result Value Ref Range   Glucose-Capillary 268 (H) 70 - 99 mg/dL  Glucose, capillary     Status: Abnormal   Collection Time: 05/19/19  4:13 PM  Result Value Ref Range   Glucose-Capillary 177 (H) 70 - 99 mg/dL  Glucose, capillary     Status: Abnormal   Collection Time: 05/19/19  8:54 PM  Result Value Ref Range   Glucose-Capillary 227 (H) 70 - 99 mg/dL   Comment 1 Notify RN    Comment 2 Document in Chart   Glucose, capillary      Status: Abnormal   Collection Time: 05/20/19  8:46 AM  Result Value Ref Range   Glucose-Capillary 191 (H) 70 - 99 mg/dL  Glucose, capillary     Status: Abnormal   Collection Time: 05/20/19 12:49 PM  Result Value Ref Range   Glucose-Capillary 229 (H) 70 - 99 mg/dL  Glucose, capillary     Status: Abnormal   Collection Time: 05/20/19  4:34 PM  Result Value Ref Range   Glucose-Capillary 174 (H) 70 - 99 mg/dL  Glucose, capillary     Status: None   Collection Time: 05/20/19  9:23 PM  Result Value Ref Range   Glucose-Capillary 84 70 - 99 mg/dL  Glucose, capillary     Status: Abnormal   Collection Time: 05/21/19  7:22 AM  Result Value Ref Range   Glucose-Capillary 164 (H) 70 - 99 mg/dL    ABGS No results for input(s): PHART, PO2ART, TCO2, HCO3 in the last 72 hours.  Invalid input(s): PCO2 CULTURES No results found for this or any previous visit (from the past 240 hour(s)). Studies/Results: No results found.  Medications:  Prior to Admission:  Medications Prior to Admission  Medication Sig Dispense Refill Last Dose  . albuterol (VENTOLIN HFA) 108 (90 BASE) MCG/ACT inhaler Inhale 2 puffs into the lungs every 6 (six) hours as needed for wheezing or shortness of breath.     [name]  alfuzosin (UROXATRAL) 10 MG 24 hr tablet Take 10 mg by mouth daily with breakfast.   05/03/2019 at Unknown time  . azithromycin (ZITHROMAX) 500 MG tablet 1 Tab daily per Dr Ivan Anchors medication 30 tablet 5 05/03/2019 at Unknown time  . benzonatate (TESSALON) 200 MG capsule Take 200 mg by mouth 3 (three) times daily.   05/03/2019 at Unknown time  . budesonide (PULMICORT) 0.25 MG/2ML nebulizer solution Take 2 mLs (0.25 mg total) by nebulization 2 (two) times daily. 60 mL 12 05/03/2019 at Unknown time  . CARTIA XT 120 MG 24 hr capsule Take 1 capsule (120 mg total) by mouth daily. 7 capsule 0 05/03/2019 at Unknown time  . diphenhydrAMINE (BENADRYL) 25 mg capsule Take 25 mg by mouth every 8 (eight) hours as  needed. Felt mouth swelling at ED visit 08/16/18.     [name] doxycycline (VIBRAMYCIN) 100 MG capsule TK 1 C PO BID   05/03/2019 at Unknown time  . fexofenadine (ALLEGRA) 180 MG tablet Take 180 mg by mouth daily.   05/03/2019 at Unknown time  . fluticasone (FLONASE) 50 MCG/ACT nasal spray Place 2 sprays into both nostrils daily as needed.   5   . Fluticasone-Umeclidin-Vilant (TRELEGY ELLIPTA) 100-62.5-25 MCG/INH AEPB Inhale 1 puff into the lungs daily.   05/03/2019 at Unknown time  . furosemide (LASIX) 40 MG tablet Take 1 tablet (40 mg total) by mouth daily. As needed for swelling 30 tablet 11 05/03/2019 at Unknown time  . insulin glargine (LANTUS) 100 UNIT/ML injection Inject 0.4 mLs (40 Units total) into the skin daily. 24 units daily 10 mL 2 05/03/2019 at Unknown time  . insulin lispro (HUMALOG) 100 UNIT/ML injection Take by sliding scale provided by hospital maximum amount daily is 60 units.  Check blood sugar 4 times a day (Patient taking differently: Inject 0-15 Units into the skin 3 (three) times daily with meals. Take by sliding scale provided by hospital maximum amount daily is 60 units.  Check blood sugar 4 times a day) 10 mL 11 05/03/2019 at Unknown time  . ipratropium (ATROVENT) 0.02 % nebulizer solution Take 0.5 mg by nebulization 2 (two) times daily.   5 05/03/2019 at Unknown time  . levalbuterol (XOPENEX) 0.63 MG/3ML nebulizer solution Take 0.63 mg by nebulization 2 (two) times daily.   05/03/2019 at Unknown time  . LORazepam (ATIVAN) 0.5 MG tablet Take 1 tablet (0.5 mg total) by mouth 2 (two) times daily as needed for anxiety or sleep. (Patient taking differently: Take 0.5 mg by mouth 4 (four) times daily. Pt takes it three times daily.) 20 tablet 0 05/03/2019 at Unknown time  . magnesium 30 MG tablet Take 30 mg by mouth daily.   05/03/2019 at Unknown time  . metFORMIN (GLUCOPHAGE) 500 MG tablet Take 1 tablet (500 mg total) by mouth daily with breakfast. 30 tablet 3 05/03/2019 at Unknown time  .  montelukast (SINGULAIR) 10 MG tablet Take 1 tablet by mouth daily.   05/03/2019 at Unknown time  . nitroGLYCERIN (NITROSTAT) 0.4 MG SL tablet Place 1 tablet (0.4 mg total) under the tongue every 5 (five) minutes as needed for chest pain. 25 tablet 4   . omeprazole (PRILOSEC) 20 MG capsule Take 1 capsule (20 mg total) by mouth 2 (two) times daily before a meal. 180 capsule 3 05/03/2019 at Unknown time  . oxyCODONE-acetaminophen (PERCOCET) 10-325 MG tablet Take 1 tablet by mouth every 4 (four) hours as needed for pain.   05/03/2019 at Unknown time  . potassium chloride  SA (K-DUR,KLOR-CON) 20 MEQ tablet Take 1 tablet twice daily while on Lasix 60 tablet 2 05/03/2019 at Unknown time  . predniSONE (DELTASONE) 10 MG tablet Take 2 daily for 1 week then 1 daily (Patient taking differently: Take 20 mg by mouth daily with breakfast. Takes 2 tablets daily.) 60 tablet 5 05/03/2019 at Unknown time  . rifampin (RIFADIN) 300 MG capsule Take 2 capsules (600 mg total) by mouth daily. Long-term medication 60 capsule 11 05/03/2019 at Unknown time  . rosuvastatin (CRESTOR) 10 MG tablet Take 10 mg by mouth every morning.   11 05/03/2019 at Unknown time  . SYMBICORT 160-4.5 MCG/ACT inhaler INL 2 PFS PO BID   05/03/2019 at Unknown time  . tamsulosin (FLOMAX) 0.4 MG CAPS capsule Take 1 capsule (0.4 mg total) by mouth 2 (two) times daily. 60 capsule 2 05/03/2019 at Unknown time  . acetaminophen (TYLENOL) 325 MG tablet Take 2 tablets (650 mg total) by mouth every 6 (six) hours as needed for mild pain, moderate pain, fever or headache (or Fever >/= 101). (Patient not taking: Reported on 05/04/2019) 12 tablet 1 Not Taking at Unknown time   Scheduled: . azithromycin  500 mg Oral Daily  . benzonatate  200 mg Oral TID  . budesonide  0.25 mg Nebulization BID  . Clofazimine  100 mg Does not apply Daily  . diltiazem  120 mg Oral Daily  . feeding supplement (ENSURE ENLIVE)  237 mL Oral BID BM  . furosemide  80 mg Oral BID  .  guaiFENesin  600 mg Oral BID  . heparin  5,000 Units Subcutaneous Q8H  . insulin aspart  0-20 Units Subcutaneous TID WC  . insulin aspart  0-5 Units Subcutaneous QHS  . insulin aspart  18 Units Subcutaneous TID WC  . insulin glargine  65 Units Subcutaneous Daily  . ipratropium  0.5 mg Nebulization BID  . levalbuterol  1.25 mg Nebulization Q6H  . loratadine  10 mg Oral Daily  . magic mouthwash  5 mL Oral TID PC & HS  . magnesium oxide  200 mg Oral Daily  . metFORMIN  500 mg Oral Q breakfast  . mometasone-formoterol  2 puff Inhalation BID  . mupirocin ointment   Nasal BID  . neomycin-bacitracin-polymyxin   Topical TID  . pantoprazole  40 mg Oral BID AC  . potassium chloride SA  40 mEq Oral Daily  . predniSONE  30 mg Oral Q breakfast  . rifampin  600 mg Oral Daily  . rosuvastatin  10 mg Oral q morning - 10a  . sodium chloride flush  3 mL Intravenous Q12H  . tamsulosin  0.4 mg Oral BID  . umeclidinium-vilanterol  1 puff Inhalation Daily   Continuous: . sodium chloride     LXB:WIOMBT chloride, acetaminophen **OR** acetaminophen, diphenhydrAMINE, HYDROcodone-homatropine, levalbuterol, LORazepam, methocarbamol, nitroGLYCERIN, ondansetron **OR** ondansetron (ZOFRAN) IV, oxyCODONE-acetaminophen, polyethylene glycol, sodium chloride, sodium chloride flush, traZODone  Assesment: He was admitted with acute exacerbation of CHF.  He is better but still has significant swelling of his right leg.  This may be more of a problem from venous stasis.  He is still having bouts of being extremely short of breath.  He does not have any other new complaints now.  He did have some blood in his stool yesterday.  He has severe COPD at baseline.  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  He has anxiety which causes him some  problems with his breathing  He has diabetes and is on insulin Active Problems:   Type 2 diabetes mellitus without complication  (HCC)   COPD exacerbation (HCC)   PAF (paroxysmal atrial fibrillation) (HCC)   Chronic respiratory failure with hypoxia (HCC)   MAI (mycobacterium avium-intracellulare) (HCC)   Lower extremity edema   Vision disorder   Acute on chronic respiratory failure with hypoxia (HCC)   GERD (gastroesophageal reflux disease)   Chronic pain   Anxiety   Dyspnea   Acute exacerbation of CHF (congestive heart failure) (HCC)    Plan: Try the TED hose.  Continue everything else.    LOS: 17 days   Ryan [name] 05/21/2019, 8:30 AM

## 2019-05-22 LAB — CBC WITH DIFFERENTIAL/PLATELET
Abs Immature Granulocytes: 0.07 10*3/uL (ref 0.00–0.07)
Basophils Absolute: 0 10*3/uL (ref 0.0–0.1)
Basophils Relative: 0 %
Eosinophils Absolute: 0.6 10*3/uL — ABNORMAL HIGH (ref 0.0–0.5)
Eosinophils Relative: 9 %
HCT: 31.7 % — ABNORMAL LOW (ref 39.0–52.0)
Hemoglobin: 9.7 g/dL — ABNORMAL LOW (ref 13.0–17.0)
Immature Granulocytes: 1 %
Lymphocytes Relative: 22 %
Lymphs Abs: 1.5 10*3/uL (ref 0.7–4.0)
MCH: 28.1 pg (ref 26.0–34.0)
MCHC: 30.6 g/dL (ref 30.0–36.0)
MCV: 91.9 fL (ref 80.0–100.0)
Monocytes Absolute: 1 10*3/uL (ref 0.1–1.0)
Monocytes Relative: 15 %
Neutro Abs: 3.6 10*3/uL (ref 1.7–7.7)
Neutrophils Relative %: 53 %
Platelets: 231 10*3/uL (ref 150–400)
RBC: 3.45 MIL/uL — ABNORMAL LOW (ref 4.22–5.81)
RDW: 16 % — ABNORMAL HIGH (ref 11.5–15.5)
WBC: 6.7 10*3/uL (ref 4.0–10.5)
nRBC: 0.4 % — ABNORMAL HIGH (ref 0.0–0.2)

## 2019-05-22 LAB — COMPREHENSIVE METABOLIC PANEL
ALT: 31 U/L (ref 0–44)
AST: 18 U/L (ref 15–41)
Albumin: 2.7 g/dL — ABNORMAL LOW (ref 3.5–5.0)
Alkaline Phosphatase: 51 U/L (ref 38–126)
Anion gap: 12 (ref 5–15)
BUN: 25 mg/dL — ABNORMAL HIGH (ref 6–20)
CO2: 32 mmol/L (ref 22–32)
Calcium: 8.6 mg/dL — ABNORMAL LOW (ref 8.9–10.3)
Chloride: 94 mmol/L — ABNORMAL LOW (ref 98–111)
Creatinine, Ser: 1.07 mg/dL (ref 0.61–1.24)
GFR calc Af Amer: >60 mL/min (ref >60)
GFR calc non Af Amer: >60 mL/min (ref >60)
Glucose, Bld: 193 mg/dL — ABNORMAL HIGH (ref 70–99)
Potassium: 3.6 mmol/L (ref 3.5–5.1)
Sodium: 138 mmol/L (ref 135–145)
Total Bilirubin: 0.5 mg/dL (ref 0.3–1.2)
Total Protein: 6.5 g/dL (ref 6.5–8.1)

## 2019-05-22 LAB — GLUCOSE, CAPILLARY
Glucose-Capillary: 189 mg/dL — ABNORMAL HIGH (ref 70–99)
Glucose-Capillary: 262 mg/dL — ABNORMAL HIGH (ref 70–99)
Glucose-Capillary: 90 mg/dL (ref 70–99)
Glucose-Capillary: 92 mg/dL (ref 70–99)

## 2019-05-22 NOTE — Progress Notes (Signed)
Subjective: He says he feels better.  The use of the TED hose with soft material underneath has worked pretty well he has less swelling of his leg  Objective: Vital signs in last 24 hours: Temp:  [98.3 F (36.8 C)-98.8 F (37.1 C)] 98.3 F (36.8 C) (11/08 0530) Pulse Rate:  [96-106] 102 (11/08 0530) Resp:  [18-20] 18 (11/08 0530) BP: (109-127)/(60-80) 109/60 (11/08 0530) SpO2:  [94 %-100 %] 100 % (11/08 0806) Weight:  [88.6 kg] 88.6 kg (11/08 0530) Weight change: 2 kg Last BM Date: 05/19/19  Intake/Output from previous day: 11/07 0701 - 11/08 0700 In: 723 [P.O.:720; I.V.:3] Out: 1750 [Urine:1750]  PHYSICAL EXAM General appearance: alert, cooperative and no distress Resp: rhonchi bilaterally Cardio: regular rate and rhythm, S1, S2 normal, no murmur, click, rub or gallop GI: soft, non-tender; bowel sounds normal; no masses,  no organomegaly Extremities: Leg swelling is definitely better but not gone  Lab Results:  Results for orders placed or performed during the hospital encounter of 05/04/19 (from the past 48 hour(s))  Glucose, capillary     Status: Abnormal   Collection Time: 05/20/19 12:49 PM  Result Value Ref Range   Glucose-Capillary 229 (H) 70 - 99 mg/dL  Glucose, capillary     Status: Abnormal   Collection Time: 05/20/19  4:34 PM  Result Value Ref Range   Glucose-Capillary 174 (H) 70 - 99 mg/dL  Glucose, capillary     Status: None   Collection Time: 05/20/19  9:23 PM  Result Value Ref Range   Glucose-Capillary 84 70 - 99 mg/dL  Glucose, capillary     Status: Abnormal   Collection Time: 05/21/19  7:22 AM  Result Value Ref Range   Glucose-Capillary 164 (H) 70 - 99 mg/dL  Glucose, capillary     Status: Abnormal   Collection Time: 05/21/19 10:58 AM  Result Value Ref Range   Glucose-Capillary 239 (H) 70 - 99 mg/dL  Glucose, capillary     Status: Abnormal   Collection Time: 05/21/19  3:56 PM  Result Value Ref Range   Glucose-Capillary 163 (H) 70 - 99 mg/dL   Glucose, capillary     Status: None   Collection Time: 05/21/19 10:21 PM  Result Value Ref Range   Glucose-Capillary 97 70 - 99 mg/dL  CBC with Differential     Status: Abnormal   Collection Time: 05/22/19  6:14 AM  Result Value Ref Range   WBC 6.7 4.0 - 10.5 K/uL   RBC 3.45 (L) 4.22 - 5.81 MIL/uL   Hemoglobin 9.7 (L) 13.0 - 17.0 g/dL   HCT 31.7 (L) 39.0 - 52.0 %   MCV 91.9 80.0 - 100.0 fL   MCH 28.1 26.0 - 34.0 pg   MCHC 30.6 30.0 - 36.0 g/dL   RDW 16.0 (H) 11.5 - 15.5 %   Platelets 231 150 - 400 K/uL   nRBC 0.4 (H) 0.0 - 0.2 %   Neutrophils Relative % 53 %   Neutro Abs 3.6 1.7 - 7.7 K/uL   Lymphocytes Relative 22 %   Lymphs Abs 1.5 0.7 - 4.0 K/uL   Monocytes Relative 15 %   Monocytes Absolute 1.0 0.1 - 1.0 K/uL   Eosinophils Relative 9 %   Eosinophils Absolute 0.6 (H) 0.0 - 0.5 K/uL   Basophils Relative 0 %   Basophils Absolute 0.0 0.0 - 0.1 K/uL   Immature Granulocytes 1 %   Abs Immature Granulocytes 0.07 0.00 - 0.07 K/uL    Comment: Performed at [facility], [address], [place], [place] [address]  Comprehensive metabolic panel tomorrow     Status: Abnormal   Collection Time: 05/22/19  6:14 AM  Result Value Ref Range   Sodium 138 135 - 145 mmol/L   Potassium 3.6 3.5 - 5.1 mmol/L   Chloride 94 (L) 98 - 111 mmol/L   CO2 32 22 - 32 mmol/L   Glucose, Bld 193 (H) 70 - 99 mg/dL   BUN 25 (H) 6 - 20 mg/dL   Creatinine, Ser 1.07 0.61 - 1.24 mg/dL   Calcium 8.6 (L) 8.9 - 10.3 mg/dL   Total Protein 6.5 6.5 - 8.1 g/dL   Albumin 2.7 (L) 3.5 - 5.0 g/dL   AST 18 15 - 41 U/L   ALT 31 0 - 44 U/L   Alkaline Phosphatase 51 38 - 126 U/L   Total Bilirubin 0.5 0.3 - 1.2 mg/dL   GFR calc non Af Amer >60 >60 mL/min   GFR calc Af Amer >60 >60 mL/min   Anion gap 12 5 - 15    Comment: Performed at [facility], [address] [place], [place] [address]  Glucose, capillary     Status: Abnormal   Collection Time: 05/22/19  7:45 AM  Result Value Ref Range   Glucose-Capillary 189 (H)  70 - 99 mg/dL    ABGS No results for input(s): PHART, PO2ART, TCO2, HCO3 in the last 72 hours.  Invalid input(s): PCO2 CULTURES No results found for this or any previous visit (from the past 240 hour(s)). Studies/Results: No results found.  Medications:  Prior to Admission:  Medications Prior to Admission  Medication Sig Dispense Refill Last Dose  . albuterol (VENTOLIN HFA) 108 (90 BASE) MCG/ACT inhaler Inhale 2 puffs into the lungs every 6 (six) hours as needed for wheezing or shortness of breath.     . alfuzosin (UROXATRAL) 10 MG 24 hr tablet Take 10 mg by mouth daily with breakfast.   05/03/2019 at Unknown time  . azithromycin (ZITHROMAX) 500 MG tablet 1 Tab daily per Dr Ivan Anchors medication 30 tablet 5 05/03/2019 at Unknown time  . benzonatate (TESSALON) 200 MG capsule Take 200 mg by mouth 3 (three) times daily.   05/03/2019 at Unknown time  . budesonide (PULMICORT) 0.25 MG/2ML nebulizer solution Take 2 mLs (0.25 mg total) by nebulization 2 (two) times daily. 60 mL 12 05/03/2019 at Unknown time  . CARTIA XT 120 MG 24 hr capsule Take 1 capsule (120 mg total) by mouth daily. 7 capsule 0 05/03/2019 at Unknown time  . diphenhydrAMINE (BENADRYL) 25 mg capsule Take 25 mg by mouth every 8 (eight) hours as needed. Felt mouth swelling at ED visit 08/16/18.     [name] doxycycline (VIBRAMYCIN) 100 MG capsule TK 1 C PO BID   05/03/2019 at Unknown time  . fexofenadine (ALLEGRA) 180 MG tablet Take 180 mg by mouth daily.   05/03/2019 at Unknown time  . fluticasone (FLONASE) 50 MCG/ACT nasal spray Place 2 sprays into both nostrils daily as needed.   5   . Fluticasone-Umeclidin-Vilant (TRELEGY ELLIPTA) 100-62.5-25 MCG/INH AEPB Inhale 1 puff into the lungs daily.   05/03/2019 at Unknown time  . furosemide (LASIX) 40 MG tablet Take 1 tablet (40 mg total) by mouth daily. As needed for swelling 30 tablet 11 05/03/2019 at Unknown time  . insulin glargine (LANTUS) 100 UNIT/ML injection Inject 0.4 mLs (40  Units total) into the skin daily. 24 units daily 10 mL 2 05/03/2019 at Unknown time  .  insulin lispro (HUMALOG) 100 UNIT/ML injection Take by sliding scale provided by hospital maximum amount daily is 60 units.  Check blood sugar 4 times a day (Patient taking differently: Inject 0-15 Units into the skin 3 (three) times daily with meals. Take by sliding scale provided by hospital maximum amount daily is 60 units.  Check blood sugar 4 times a day) 10 mL 11 05/03/2019 at Unknown time  . ipratropium (ATROVENT) 0.02 % nebulizer solution Take 0.5 mg by nebulization 2 (two) times daily.   5 05/03/2019 at Unknown time  . levalbuterol (XOPENEX) 0.63 MG/3ML nebulizer solution Take 0.63 mg by nebulization 2 (two) times daily.   05/03/2019 at Unknown time  . LORazepam (ATIVAN) 0.5 MG tablet Take 1 tablet (0.5 mg total) by mouth 2 (two) times daily as needed for anxiety or sleep. (Patient taking differently: Take 0.5 mg by mouth 4 (four) times daily. Pt takes it three times daily.) 20 tablet 0 05/03/2019 at Unknown time  . magnesium 30 MG tablet Take 30 mg by mouth daily.   05/03/2019 at Unknown time  . metFORMIN (GLUCOPHAGE) 500 MG tablet Take 1 tablet (500 mg total) by mouth daily with breakfast. 30 tablet 3 05/03/2019 at Unknown time  . montelukast (SINGULAIR) 10 MG tablet Take 1 tablet by mouth daily.   05/03/2019 at Unknown time  . nitroGLYCERIN (NITROSTAT) 0.4 MG SL tablet Place 1 tablet (0.4 mg total) under the tongue every 5 (five) minutes as needed for chest pain. 25 tablet 4   . omeprazole (PRILOSEC) 20 MG capsule Take 1 capsule (20 mg total) by mouth 2 (two) times daily before a meal. 180 capsule 3 05/03/2019 at Unknown time  . oxyCODONE-acetaminophen (PERCOCET) 10-325 MG tablet Take 1 tablet by mouth every 4 (four) hours as needed for pain.   05/03/2019 at Unknown time  . potassium chloride SA (K-DUR,KLOR-CON) 20 MEQ tablet Take 1 tablet twice daily while on Lasix 60 tablet 2 05/03/2019 at Unknown time  .  predniSONE (DELTASONE) 10 MG tablet Take 2 daily for 1 week then 1 daily (Patient taking differently: Take 20 mg by mouth daily with breakfast. Takes 2 tablets daily.) 60 tablet 5 05/03/2019 at Unknown time  . rifampin (RIFADIN) 300 MG capsule Take 2 capsules (600 mg total) by mouth daily. Long-term medication 60 capsule 11 05/03/2019 at Unknown time  . rosuvastatin (CRESTOR) 10 MG tablet Take 10 mg by mouth every morning.   11 05/03/2019 at Unknown time  . SYMBICORT 160-4.5 MCG/ACT inhaler INL 2 PFS PO BID   05/03/2019 at Unknown time  . tamsulosin (FLOMAX) 0.4 MG CAPS capsule Take 1 capsule (0.4 mg total) by mouth 2 (two) times daily. 60 capsule 2 05/03/2019 at Unknown time  . acetaminophen (TYLENOL) 325 MG tablet Take 2 tablets (650 mg total) by mouth every 6 (six) hours as needed for mild pain, moderate pain, fever or headache (or Fever >/= 101). (Patient not taking: Reported on 05/04/2019) 12 tablet 1 Not Taking at Unknown time   Scheduled: . azithromycin  500 mg Oral Daily  . benzonatate  200 mg Oral TID  . budesonide  0.25 mg Nebulization BID  . Clofazimine  100 mg Does not apply Daily  . diltiazem  120 mg Oral Daily  . feeding supplement (ENSURE ENLIVE)  237 mL Oral BID BM  . furosemide  80 mg Oral BID  . guaiFENesin  600 mg Oral BID  . heparin  5,000 Units Subcutaneous Q8H  . insulin aspart  0-20  Units Subcutaneous TID WC  . insulin aspart  0-5 Units Subcutaneous QHS  . insulin aspart  18 Units Subcutaneous TID WC  . insulin glargine  65 Units Subcutaneous Daily  . ipratropium  0.5 mg Nebulization BID  . levalbuterol  1.25 mg Nebulization Q6H  . loratadine  10 mg Oral Daily  . magic mouthwash  5 mL Oral TID PC & HS  . magnesium oxide  200 mg Oral Daily  . metFORMIN  500 mg Oral Q breakfast  . mometasone-formoterol  2 puff Inhalation BID  . mupirocin ointment   Nasal BID  . neomycin-bacitracin-polymyxin   Topical TID  . pantoprazole  40 mg Oral BID AC  . potassium chloride SA   40 mEq Oral Daily  . predniSONE  30 mg Oral Q breakfast  . rifampin  600 mg Oral Daily  . rosuvastatin  10 mg Oral q morning - 10a  . sodium chloride flush  3 mL Intravenous Q12H  . tamsulosin  0.4 mg Oral BID  . umeclidinium-vilanterol  1 puff Inhalation Daily   Continuous: . sodium chloride     GBM:SXJDBZ chloride, acetaminophen **OR** acetaminophen, diphenhydrAMINE, HYDROcodone-homatropine, levalbuterol, LORazepam, methocarbamol, nitroGLYCERIN, ondansetron **OR** ondansetron (ZOFRAN) IV, oxyCODONE-acetaminophen, polyethylene glycol, sodium chloride, sodium chloride flush, traZODone  Assesment: He was admitted with acute exacerbation of heart failure.  He is better and still has some swelling of his right greater than left leg.  He is still short of breath with minimal exertion but that is pretty much baseline for him although I think he is a little bit worse.  He has had 2 severe episodes of shortness of breath associated with getting up and moving around  It has been recommended that he go to skilled care facility for rehab but he does not want to do that  He has acute on chronic hypoxic respiratory failure and he remains on oxygen  He has diabetes with better control  He has pulmonary MAC infection and he is on medication Active Problems:   Type 2 diabetes mellitus without complication (HCC)   COPD exacerbation (HCC)   PAF (paroxysmal atrial fibrillation) (HCC)   Chronic respiratory failure with hypoxia ([place])   MAI (mycobacterium avium-intracellulare) ([place])   Lower extremity edema   Vision disorder   Acute on chronic respiratory failure with hypoxia (HCC)   GERD (gastroesophageal reflux disease)   Chronic pain   Anxiety   Dyspnea   Acute exacerbation of CHF (congestive heart failure) ([place])    Plan: Continue treatments.  Use the TED hose again today.  He is approaching discharge    LOS: 18 days   Ryan [name] 05/22/2019, 9:10 AM

## 2019-05-22 NOTE — Progress Notes (Signed)
Went in to room to assess patient in the beginning of shift.  Patient requested ted hose and material underneath taken off. Ted house was taken off per patient request.  Will continue to monitor patient.

## 2019-05-22 NOTE — Progress Notes (Signed)
Pt states that he can't breathe, asking for O2 to be increased. SalO2 95% on 6 lpm, breath sounds much clearer than yesterday with productive cough expectorating thick tan colored sputum. Reassured pt that his breathing was normal (respirations counted at 21/min). Called RT to come and administer scheduled treatments. Stayed with patient for several minutes till he calmed down and began talking about his anniversary yesterday. RT in to admin neb.

## 2019-05-23 ENCOUNTER — Inpatient Hospital Stay (HOSPITAL_COMMUNITY): Payer: Medicare Other

## 2019-05-23 LAB — GLUCOSE, CAPILLARY
Glucose-Capillary: 255 mg/dL — ABNORMAL HIGH (ref 70–99)
Glucose-Capillary: 86 mg/dL (ref 70–99)
Glucose-Capillary: 217 mg/dL — ABNORMAL HIGH (ref 70–99)
Glucose-Capillary: 231 mg/dL — ABNORMAL HIGH (ref 70–99)

## 2019-05-23 MED ORDER — IOHEXOL 300 MG/ML  SOLN
100.0000 mL | Freq: Once | INTRAMUSCULAR | Status: AC | PRN
  Administered 2019-05-23: 100 mL via INTRAVENOUS

## 2019-05-23 NOTE — Progress Notes (Signed)
Mr Blowers appears about at base line with his respiratory status. Hopefully other health problems will resolve soon.

## 2019-05-23 NOTE — Progress Notes (Signed)
Subjective: He feels better.  He still has swelling of his right greater than left leg.  He does not have any other new complaints.  He is still coughing.  He is still short of breath.  Objective: Vital signs in last 24 hours: Temp:  [98.1 F (36.7 C)-98.5 F (36.9 C)] 98.4 F (36.9 C) (11/09 0645) Pulse Rate:  [99-114] 109 (11/09 0645) Resp:  [18-20] 20 (11/08 2140) BP: (122-158)/(71-98) 127/71 (11/09 0645) SpO2:  [93 %-98 %] 93 % (11/09 0755) Weight change:  Last BM Date: 05/22/19  Intake/Output from previous day: 11/08 0701 - 11/09 0700 In: 723 [P.O.:720; I.V.:3] Out: 1450 [Urine:1450]  PHYSICAL EXAM General appearance: alert, cooperative and no distress Resp: rhonchi bilaterally Cardio: regular rate and rhythm, S1, S2 normal, no murmur, click, rub or gallop GI: soft, non-tender; bowel sounds normal; no masses,  no organomegaly Extremities: Still with substantial edema right leg more than left  Lab Results:  Results for orders placed or performed during the hospital encounter of 05/04/19 (from the past 48 hour(s))  Glucose, capillary     Status: Abnormal   Collection Time: 05/21/19 10:58 AM  Result Value Ref Range   Glucose-Capillary 239 (H) 70 - 99 mg/dL  Glucose, capillary     Status: Abnormal   Collection Time: 05/21/19  3:56 PM  Result Value Ref Range   Glucose-Capillary 163 (H) 70 - 99 mg/dL  Glucose, capillary     Status: None   Collection Time: 05/21/19 10:21 PM  Result Value Ref Range   Glucose-Capillary 97 70 - 99 mg/dL  CBC with Differential     Status: Abnormal   Collection Time: 05/22/19  6:14 AM  Result Value Ref Range   WBC 6.7 4.0 - 10.5 K/uL   RBC 3.45 (L) 4.22 - 5.81 MIL/uL   Hemoglobin 9.7 (L) 13.0 - 17.0 g/dL   HCT 31.7 (L) 39.0 - 52.0 %   MCV 91.9 80.0 - 100.0 fL   MCH 28.1 26.0 - 34.0 pg   MCHC 30.6 30.0 - 36.0 g/dL   RDW 16.0 (H) 11.5 - 15.5 %   Platelets 231 150 - 400 K/uL   nRBC 0.4 (H) 0.0 - 0.2 %   Neutrophils Relative % 53 %   Neutro Abs 3.6 1.7 - 7.7 K/uL   Lymphocytes Relative 22 %   Lymphs Abs 1.5 0.7 - 4.0 K/uL   Monocytes Relative 15 %   Monocytes Absolute 1.0 0.1 - 1.0 K/uL   Eosinophils Relative 9 %   Eosinophils Absolute 0.6 (H) 0.0 - 0.5 K/uL   Basophils Relative 0 %   Basophils Absolute 0.0 0.0 - 0.1 K/uL   Immature Granulocytes 1 %   Abs Immature Granulocytes 0.07 0.00 - 0.07 K/uL    Comment: Performed at [facility], [address] [place], [place] [address]  Comprehensive metabolic panel tomorrow     Status: Abnormal   Collection Time: 05/22/19  6:14 AM  Result Value Ref Range   Sodium 138 135 - 145 mmol/L   Potassium 3.6 3.5 - 5.1 mmol/L   Chloride 94 (L) 98 - 111 mmol/L   CO2 32 22 - 32 mmol/L   Glucose, Bld 193 (H) 70 - 99 mg/dL   BUN 25 (H) 6 - 20 mg/dL   Creatinine, Ser 1.07 0.61 - 1.24 mg/dL   Calcium 8.6 (L) 8.9 - 10.3 mg/dL   Total Protein 6.5 6.5 - 8.1 g/dL   Albumin 2.7 (L) 3.5 - 5.0 g/dL  AST 18 15 - 41 U/L   ALT 31 0 - 44 U/L   Alkaline Phosphatase 51 38 - 126 U/L   Total Bilirubin 0.5 0.3 - 1.2 mg/dL   GFR calc non Af Amer >60 >60 mL/min   GFR calc Af Amer >60 >60 mL/min   Anion gap 12 5 - 15    Comment: Performed at [facility], [address] [place], [place] [address]  Glucose, capillary     Status: Abnormal   Collection Time: 05/22/19  7:45 AM  Result Value Ref Range   Glucose-Capillary 189 (H) 70 - 99 mg/dL  Glucose, capillary     Status: Abnormal   Collection Time: 05/22/19 11:00 AM  Result Value Ref Range   Glucose-Capillary 262 (H) 70 - 99 mg/dL  Glucose, capillary     Status: None   Collection Time: 05/22/19  4:56 PM  Result Value Ref Range   Glucose-Capillary 90 70 - 99 mg/dL  Glucose, capillary     Status: None   Collection Time: 05/22/19  9:37 PM  Result Value Ref Range   Glucose-Capillary 92 70 - 99 mg/dL  Glucose, capillary     Status: Abnormal   Collection Time: 05/23/19  7:50 AM  Result Value Ref Range   Glucose-Capillary 255 (H) 70 - 99 mg/dL     ABGS No results for input(s): PHART, PO2ART, TCO2, HCO3 in the last 72 hours.  Invalid input(s): PCO2 CULTURES No results found for this or any previous visit (from the past 240 hour(s)). Studies/Results: No results found.  Medications:  Prior to Admission:  Medications Prior to Admission  Medication Sig Dispense Refill Last Dose  . albuterol (VENTOLIN HFA) 108 (90 BASE) MCG/ACT inhaler Inhale 2 puffs into the lungs every 6 (six) hours as needed for wheezing or shortness of breath.     . alfuzosin (UROXATRAL) 10 MG 24 hr tablet Take 10 mg by mouth daily with breakfast.   05/03/2019 at Unknown time  . azithromycin (ZITHROMAX) 500 MG tablet 1 Tab daily per Dr Ivan Anchors medication 30 tablet 5 05/03/2019 at Unknown time  . benzonatate (TESSALON) 200 MG capsule Take 200 mg by mouth 3 (three) times daily.   05/03/2019 at Unknown time  . budesonide (PULMICORT) 0.25 MG/2ML nebulizer solution Take 2 mLs (0.25 mg total) by nebulization 2 (two) times daily. 60 mL 12 05/03/2019 at Unknown time  . CARTIA XT 120 MG 24 hr capsule Take 1 capsule (120 mg total) by mouth daily. 7 capsule 0 05/03/2019 at Unknown time  . diphenhydrAMINE (BENADRYL) 25 mg capsule Take 25 mg by mouth every 8 (eight) hours as needed. Felt mouth swelling at ED visit 08/16/18.     [name] doxycycline (VIBRAMYCIN) 100 MG capsule TK 1 C PO BID   05/03/2019 at Unknown time  . fexofenadine (ALLEGRA) 180 MG tablet Take 180 mg by mouth daily.   05/03/2019 at Unknown time  . fluticasone (FLONASE) 50 MCG/ACT nasal spray Place 2 sprays into both nostrils daily as needed.   5   . Fluticasone-Umeclidin-Vilant (TRELEGY ELLIPTA) 100-62.5-25 MCG/INH AEPB Inhale 1 puff into the lungs daily.   05/03/2019 at Unknown time  . furosemide (LASIX) 40 MG tablet Take 1 tablet (40 mg total) by mouth daily. As needed for swelling 30 tablet 11 05/03/2019 at Unknown time  . insulin glargine (LANTUS) 100 UNIT/ML injection Inject 0.4 mLs (40 Units total)  into the skin daily. 24 units daily 10 mL 2 05/03/2019 at Unknown time  . insulin  lispro (HUMALOG) 100 UNIT/ML injection Take by sliding scale provided by hospital maximum amount daily is 60 units.  Check blood sugar 4 times a day (Patient taking differently: Inject 0-15 Units into the skin 3 (three) times daily with meals. Take by sliding scale provided by hospital maximum amount daily is 60 units.  Check blood sugar 4 times a day) 10 mL 11 05/03/2019 at Unknown time  . ipratropium (ATROVENT) 0.02 % nebulizer solution Take 0.5 mg by nebulization 2 (two) times daily.   5 05/03/2019 at Unknown time  . levalbuterol (XOPENEX) 0.63 MG/3ML nebulizer solution Take 0.63 mg by nebulization 2 (two) times daily.   05/03/2019 at Unknown time  . LORazepam (ATIVAN) 0.5 MG tablet Take 1 tablet (0.5 mg total) by mouth 2 (two) times daily as needed for anxiety or sleep. (Patient taking differently: Take 0.5 mg by mouth 4 (four) times daily. Pt takes it three times daily.) 20 tablet 0 05/03/2019 at Unknown time  . magnesium 30 MG tablet Take 30 mg by mouth daily.   05/03/2019 at Unknown time  . metFORMIN (GLUCOPHAGE) 500 MG tablet Take 1 tablet (500 mg total) by mouth daily with breakfast. 30 tablet 3 05/03/2019 at Unknown time  . montelukast (SINGULAIR) 10 MG tablet Take 1 tablet by mouth daily.   05/03/2019 at Unknown time  . nitroGLYCERIN (NITROSTAT) 0.4 MG SL tablet Place 1 tablet (0.4 mg total) under the tongue every 5 (five) minutes as needed for chest pain. 25 tablet 4   . omeprazole (PRILOSEC) 20 MG capsule Take 1 capsule (20 mg total) by mouth 2 (two) times daily before a meal. 180 capsule 3 05/03/2019 at Unknown time  . oxyCODONE-acetaminophen (PERCOCET) 10-325 MG tablet Take 1 tablet by mouth every 4 (four) hours as needed for pain.   05/03/2019 at Unknown time  . potassium chloride SA (K-DUR,KLOR-CON) 20 MEQ tablet Take 1 tablet twice daily while on Lasix 60 tablet 2 05/03/2019 at Unknown time  . predniSONE  (DELTASONE) 10 MG tablet Take 2 daily for 1 week then 1 daily (Patient taking differently: Take 20 mg by mouth daily with breakfast. Takes 2 tablets daily.) 60 tablet 5 05/03/2019 at Unknown time  . rifampin (RIFADIN) 300 MG capsule Take 2 capsules (600 mg total) by mouth daily. Long-term medication 60 capsule 11 05/03/2019 at Unknown time  . rosuvastatin (CRESTOR) 10 MG tablet Take 10 mg by mouth every morning.   11 05/03/2019 at Unknown time  . SYMBICORT 160-4.5 MCG/ACT inhaler INL 2 PFS PO BID   05/03/2019 at Unknown time  . tamsulosin (FLOMAX) 0.4 MG CAPS capsule Take 1 capsule (0.4 mg total) by mouth 2 (two) times daily. 60 capsule 2 05/03/2019 at Unknown time  . acetaminophen (TYLENOL) 325 MG tablet Take 2 tablets (650 mg total) by mouth every 6 (six) hours as needed for mild pain, moderate pain, fever or headache (or Fever >/= 101). (Patient not taking: Reported on 05/04/2019) 12 tablet 1 Not Taking at Unknown time   Scheduled: . azithromycin  500 mg Oral Daily  . benzonatate  200 mg Oral TID  . budesonide  0.25 mg Nebulization BID  . Clofazimine  100 mg Does not apply Daily  . diltiazem  120 mg Oral Daily  . feeding supplement (ENSURE ENLIVE)  237 mL Oral BID BM  . furosemide  80 mg Oral BID  . guaiFENesin  600 mg Oral BID  . heparin  5,000 Units Subcutaneous Q8H  . insulin aspart  0-20 Units  Subcutaneous TID WC  . insulin aspart  0-5 Units Subcutaneous QHS  . insulin aspart  18 Units Subcutaneous TID WC  . insulin glargine  65 Units Subcutaneous Daily  . ipratropium  0.5 mg Nebulization BID  . levalbuterol  1.25 mg Nebulization Q6H  . loratadine  10 mg Oral Daily  . magic mouthwash  5 mL Oral TID PC & HS  . magnesium oxide  200 mg Oral Daily  . metFORMIN  500 mg Oral Q breakfast  . mometasone-formoterol  2 puff Inhalation BID  . mupirocin ointment   Nasal BID  . neomycin-bacitracin-polymyxin   Topical TID  . pantoprazole  40 mg Oral BID AC  . potassium chloride SA  40 mEq Oral  Daily  . predniSONE  30 mg Oral Q breakfast  . rifampin  600 mg Oral Daily  . rosuvastatin  10 mg Oral q morning - 10a  . sodium chloride flush  3 mL Intravenous Q12H  . tamsulosin  0.4 mg Oral BID  . umeclidinium-vilanterol  1 puff Inhalation Daily   Continuous: . sodium chloride     TDH:RCBULA chloride, acetaminophen **OR** acetaminophen, diphenhydrAMINE, HYDROcodone-homatropine, levalbuterol, LORazepam, methocarbamol, nitroGLYCERIN, ondansetron **OR** ondansetron (ZOFRAN) IV, oxyCODONE-acetaminophen, polyethylene glycol, sodium chloride, sodium chloride flush, traZODone  Assesment: He was admitted with acute exacerbation of CHF.  He is still swelling in his legs.  There was concern that he might have had DVT and he does not in the visualized areas but I am concerned about why he is not responding to diuresis and I wonder if he has some clots further up.  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 Thornton Papas in radiology and will plan to have him get a CT abdomen and pelvis with contrast which should allow [place] to be sure about iliac veins    LOS: 19 days   Alonza Bogus 05/23/2019, 8:21 AM

## 2019-05-23 NOTE — Progress Notes (Signed)
Pt sleeping. 

## 2019-05-23 NOTE — Care Management Important Message (Signed)
Important Message  Patient Details  Name: Ryan [name] MRN: [id number] Date of Birth: 1961-01-29   Medicare Important Message Given:  Yes     [name] 05/23/2019, 2:11 PM

## 2019-05-24 LAB — GLUCOSE, CAPILLARY
Glucose-Capillary: 189 mg/dL — ABNORMAL HIGH (ref 70–99)
Glucose-Capillary: 267 mg/dL — ABNORMAL HIGH (ref 70–99)
Glucose-Capillary: 214 mg/dL — ABNORMAL HIGH (ref 70–99)
Glucose-Capillary: 146 mg/dL — ABNORMAL HIGH (ref 70–99)

## 2019-05-24 NOTE — Progress Notes (Signed)
Attempted to apply refused to have TED hose placed on. Patient stated that he was comfortable at this time and did not want to be disturbed and that he wanted to wait until his wife got her this afternoon to bathe him.

## 2019-05-24 NOTE — Progress Notes (Signed)
Physical Therapy Note  Patient Details  Name: Ryan [name] MRN: [id number] Date of Birth: 10-11-1960 Today's Date: 05/24/2019    Pt refused stating he was too tired.    Teena Irani, PTA/CLT [phone]    Roseanne Reno B 05/24/2019, 1:25 PM

## 2019-05-24 NOTE — Progress Notes (Signed)
Subjective: He continues to have swelling of his right leg.  I had him do CT abdomen and pelvis looking for clots in the iliacs and he did not have that.  He did not get the TED hose placed yesterday.  Objective: Vital signs in last 24 hours: Temp:  [98 F (36.7 C)-98.7 F (37.1 C)] 98 F (36.7 C) (11/10 0932) Pulse Rate:  [96-109] 96 (11/10 0635) Resp:  [20] 20 (11/09 2041) BP: (110-130)/(64-102) 121/71 (11/10 0635) SpO2:  [91 %-100 %] 94 % (11/10 0814) Weight:  [85.7 kg] 85.7 kg (11/10 0635) Weight change:  Last BM Date: 05/22/19  Intake/Output from previous day: 11/09 0701 - 11/10 0700 In: 240 [P.O.:240] Out: 2072 [Urine:2072]  PHYSICAL EXAM General appearance: alert, cooperative and no distress Resp: rhonchi bilaterally Cardio: regular rate and rhythm, S1, S2 normal, no murmur, click, rub or gallop GI: soft, non-tender; bowel sounds normal; no masses,  no organomegaly Extremities: 2+ edema right leg 1+ of the left  Lab Results:  Results for orders placed or performed during the hospital encounter of 05/04/19 (from the past 48 hour(s))  Glucose, capillary     Status: Abnormal   Collection Time: 05/22/19 11:00 AM  Result Value Ref Range   Glucose-Capillary 262 (H) 70 - 99 mg/dL  Glucose, capillary     Status: None   Collection Time: 05/22/19  4:56 PM  Result Value Ref Range   Glucose-Capillary 90 70 - 99 mg/dL  Glucose, capillary     Status: None   Collection Time: 05/22/19  9:37 PM  Result Value Ref Range   Glucose-Capillary 92 70 - 99 mg/dL  Glucose, capillary     Status: Abnormal   Collection Time: 05/23/19  7:50 AM  Result Value Ref Range   Glucose-Capillary 255 (H) 70 - 99 mg/dL  Glucose, capillary     Status: None   Collection Time: 05/23/19 11:20 AM  Result Value Ref Range   Glucose-Capillary 86 70 - 99 mg/dL   Comment 1 Notify RN    Comment 2 Document in Chart   Glucose, capillary     Status: Abnormal   Collection Time: 05/23/19  4:21 PM  Result Value  Ref Range   Glucose-Capillary 217 (H) 70 - 99 mg/dL   Comment 1 Notify RN    Comment 2 Document in Chart   Glucose, capillary     Status: Abnormal   Collection Time: 05/23/19  8:36 PM  Result Value Ref Range   Glucose-Capillary 231 (H) 70 - 99 mg/dL   Comment 1 Notify RN    Comment 2 Document in Chart   Glucose, capillary     Status: Abnormal   Collection Time: 05/24/19  7:58 AM  Result Value Ref Range   Glucose-Capillary 189 (H) 70 - 99 mg/dL    ABGS No results for input(s): PHART, PO2ART, TCO2, HCO3 in the last 72 hours.  Invalid input(s): PCO2 CULTURES No results found for this or any previous visit (from the past 240 hour(s)). Studies/Results: Ct Abdomen Pelvis W Contrast  Result Date: 05/23/2019 CLINICAL DATA:  Persistent leg swelling, asymmetric, negative venous ultrasound of the lower extremities, question iliac vein thrombosis EXAM: CT ABDOMEN AND PELVIS WITH CONTRAST TECHNIQUE: Multidetector CT imaging of the abdomen and pelvis was performed using the standard protocol following bolus administration of intravenous contrast. Sagittal and coronal MPR images reconstructed from axial data set. CONTRAST:  19m OMNIPAQUE IOHEXOL 300 MG/ML SOLN IV. No oral contrast administered. COMPARISON:  09/17/2018 FINDINGS: Lower chest:  Emphysematous changes with bibasilar atelectasis greater on LEFT and central peribronchial thickening. Nodular density in lingula 7 mm diameter unchanged. Tiny LEFT pleural effusion. Hepatobiliary: Contracted gallbladder.  Liver unremarkable. Pancreas: Normal appearance Spleen: Normal appearance Adrenals/Urinary Tract: Adrenal glands, kidneys, ureters, and bladder normal appearance Stomach/Bowel: Appendix not visualized. Nonobstructed small bowel loop extends into a small umbilical hernia. Stomach and bowel loops otherwise normal appearance. Vascular/Lymphatic: Atherosclerotic calcifications aorta and iliac arteries as well as coronary arteries. Aorta normal caliber.  No adenopathy. IVC, common iliac, external iliac, and visualized common femoral veins patent through common femoral vein bifurcation. No deep venous thrombosis identified. Reproductive: Unremarkable Other: No free air or free fluid. In addition to small umbilical hernia containing a nonobstructed small bowel loop, a small LEFT inguinal hernia is seen containing fat. No inflammatory process. Musculoskeletal: Unremarkable IMPRESSION: No evidence of pelvic deep venous thrombosis. Small umbilical hernia containing a nonobstructed small bowel loop. Small LEFT inguinal hernia containing fat. Stable changes of COPD and scarring at lung bases with stable 7 mm lingular nodule. No acute abnormalities. Electronically Signed   By: Lavonia Dana M.D.   On: 05/23/2019 10:13    Medications:  Prior to Admission:  Medications Prior to Admission  Medication Sig Dispense Refill Last Dose  . albuterol (VENTOLIN HFA) 108 (90 BASE) MCG/ACT inhaler Inhale 2 puffs into the lungs every 6 (six) hours as needed for wheezing or shortness of breath.     . alfuzosin (UROXATRAL) 10 MG 24 hr tablet Take 10 mg by mouth daily with breakfast.   05/03/2019 at Unknown time  . azithromycin (ZITHROMAX) 500 MG tablet 1 Tab daily per Dr Ivan Anchors medication 30 tablet 5 05/03/2019 at Unknown time  . benzonatate (TESSALON) 200 MG capsule Take 200 mg by mouth 3 (three) times daily.   05/03/2019 at Unknown time  . budesonide (PULMICORT) 0.25 MG/2ML nebulizer solution Take 2 mLs (0.25 mg total) by nebulization 2 (two) times daily. 60 mL 12 05/03/2019 at Unknown time  . CARTIA XT 120 MG 24 hr capsule Take 1 capsule (120 mg total) by mouth daily. 7 capsule 0 05/03/2019 at Unknown time  . diphenhydrAMINE (BENADRYL) 25 mg capsule Take 25 mg by mouth every 8 (eight) hours as needed. Felt mouth swelling at ED visit 08/16/18.     [name] doxycycline (VIBRAMYCIN) 100 MG capsule TK 1 C PO BID   05/03/2019 at Unknown time  . fexofenadine (ALLEGRA) 180 MG  tablet Take 180 mg by mouth daily.   05/03/2019 at Unknown time  . fluticasone (FLONASE) 50 MCG/ACT nasal spray Place 2 sprays into both nostrils daily as needed.   5   . Fluticasone-Umeclidin-Vilant (TRELEGY ELLIPTA) 100-62.5-25 MCG/INH AEPB Inhale 1 puff into the lungs daily.   05/03/2019 at Unknown time  . furosemide (LASIX) 40 MG tablet Take 1 tablet (40 mg total) by mouth daily. As needed for swelling 30 tablet 11 05/03/2019 at Unknown time  . insulin glargine (LANTUS) 100 UNIT/ML injection Inject 0.4 mLs (40 Units total) into the skin daily. 24 units daily 10 mL 2 05/03/2019 at Unknown time  . insulin lispro (HUMALOG) 100 UNIT/ML injection Take by sliding scale provided by hospital maximum amount daily is 60 units.  Check blood sugar 4 times a day (Patient taking differently: Inject 0-15 Units into the skin 3 (three) times daily with meals. Take by sliding scale provided by hospital maximum amount daily is 60 units.  Check blood sugar 4 times a day) 10 mL 11 05/03/2019 at Unknown time  .  ipratropium (ATROVENT) 0.02 % nebulizer solution Take 0.5 mg by nebulization 2 (two) times daily.   5 05/03/2019 at Unknown time  . levalbuterol (XOPENEX) 0.63 MG/3ML nebulizer solution Take 0.63 mg by nebulization 2 (two) times daily.   05/03/2019 at Unknown time  . LORazepam (ATIVAN) 0.5 MG tablet Take 1 tablet (0.5 mg total) by mouth 2 (two) times daily as needed for anxiety or sleep. (Patient taking differently: Take 0.5 mg by mouth 4 (four) times daily. Pt takes it three times daily.) 20 tablet 0 05/03/2019 at Unknown time  . magnesium 30 MG tablet Take 30 mg by mouth daily.   05/03/2019 at Unknown time  . metFORMIN (GLUCOPHAGE) 500 MG tablet Take 1 tablet (500 mg total) by mouth daily with breakfast. 30 tablet 3 05/03/2019 at Unknown time  . montelukast (SINGULAIR) 10 MG tablet Take 1 tablet by mouth daily.   05/03/2019 at Unknown time  . nitroGLYCERIN (NITROSTAT) 0.4 MG SL tablet Place 1 tablet (0.4 mg  total) under the tongue every 5 (five) minutes as needed for chest pain. 25 tablet 4   . omeprazole (PRILOSEC) 20 MG capsule Take 1 capsule (20 mg total) by mouth 2 (two) times daily before a meal. 180 capsule 3 05/03/2019 at Unknown time  . oxyCODONE-acetaminophen (PERCOCET) 10-325 MG tablet Take 1 tablet by mouth every 4 (four) hours as needed for pain.   05/03/2019 at Unknown time  . potassium chloride SA (K-DUR,KLOR-CON) 20 MEQ tablet Take 1 tablet twice daily while on Lasix 60 tablet 2 05/03/2019 at Unknown time  . predniSONE (DELTASONE) 10 MG tablet Take 2 daily for 1 week then 1 daily (Patient taking differently: Take 20 mg by mouth daily with breakfast. Takes 2 tablets daily.) 60 tablet 5 05/03/2019 at Unknown time  . rifampin (RIFADIN) 300 MG capsule Take 2 capsules (600 mg total) by mouth daily. Long-term medication 60 capsule 11 05/03/2019 at Unknown time  . rosuvastatin (CRESTOR) 10 MG tablet Take 10 mg by mouth every morning.   11 05/03/2019 at Unknown time  . SYMBICORT 160-4.5 MCG/ACT inhaler INL 2 PFS PO BID   05/03/2019 at Unknown time  . tamsulosin (FLOMAX) 0.4 MG CAPS capsule Take 1 capsule (0.4 mg total) by mouth 2 (two) times daily. 60 capsule 2 05/03/2019 at Unknown time  . acetaminophen (TYLENOL) 325 MG tablet Take 2 tablets (650 mg total) by mouth every 6 (six) hours as needed for mild pain, moderate pain, fever or headache (or Fever >/= 101). (Patient not taking: Reported on 05/04/2019) 12 tablet 1 Not Taking at Unknown time   Scheduled: . azithromycin  500 mg Oral Daily  . benzonatate  200 mg Oral TID  . budesonide  0.25 mg Nebulization BID  . Clofazimine  100 mg Does not apply Daily  . diltiazem  120 mg Oral Daily  . feeding supplement (ENSURE ENLIVE)  237 mL Oral BID BM  . furosemide  80 mg Oral BID  . guaiFENesin  600 mg Oral BID  . heparin  5,000 Units Subcutaneous Q8H  . insulin aspart  0-20 Units Subcutaneous TID WC  . insulin aspart  0-5 Units Subcutaneous QHS   . insulin aspart  18 Units Subcutaneous TID WC  . insulin glargine  65 Units Subcutaneous Daily  . ipratropium  0.5 mg Nebulization BID  . levalbuterol  1.25 mg Nebulization Q6H  . loratadine  10 mg Oral Daily  . magic mouthwash  5 mL Oral TID PC & HS  . magnesium  oxide  200 mg Oral Daily  . metFORMIN  500 mg Oral Q breakfast  . mometasone-formoterol  2 puff Inhalation BID  . mupirocin ointment   Nasal BID  . neomycin-bacitracin-polymyxin   Topical TID  . pantoprazole  40 mg Oral BID AC  . potassium chloride SA  40 mEq Oral Daily  . predniSONE  30 mg Oral Q breakfast  . rifampin  600 mg Oral Daily  . rosuvastatin  10 mg Oral q morning - 10a  . sodium chloride flush  3 mL Intravenous Q12H  . tamsulosin  0.4 mg Oral BID  . umeclidinium-vilanterol  1 puff Inhalation Daily   Continuous: . sodium chloride     FTD:DUKGUR chloride, acetaminophen **OR** acetaminophen, diphenhydrAMINE, HYDROcodone-homatropine, levalbuterol, LORazepam, methocarbamol, nitroGLYCERIN, ondansetron **OR** ondansetron (ZOFRAN) IV, oxyCODONE-acetaminophen, polyethylene glycol, sodium chloride, sodium chloride flush, traZODone  Assesment: He was admitted with acute exacerbation of CHF.  He still has significant swelling of his right leg.  He otherwise looks more euvolemic.  He did not have a clot in the iliac  He has COPD exacerbation which is improving  He has pulmonary MAC which is still being treated  He has acute on chronic hypoxic respiratory failure which is better Active Problems:   Type 2 diabetes mellitus without complication (HCC)   COPD exacerbation (HCC)   PAF (paroxysmal atrial fibrillation) (HCC)   Chronic respiratory failure with hypoxia (HCC)   MAI (mycobacterium avium-intracellulare) (HCC)   Lower extremity edema   Vision disorder   Acute on chronic respiratory failure with hypoxia (HCC)   GERD (gastroesophageal reflux disease)   Chronic pain   Anxiety   Dyspnea   Acute exacerbation of  CHF (congestive heart failure) ([place])    Plan: Restart the TED hose.    LOS: 20 days   Alonza Bogus 05/24/2019, 8:49 AM

## 2019-05-25 LAB — GLUCOSE, CAPILLARY
Glucose-Capillary: 174 mg/dL — ABNORMAL HIGH (ref 70–99)
Glucose-Capillary: 245 mg/dL — ABNORMAL HIGH (ref 70–99)
Glucose-Capillary: 179 mg/dL — ABNORMAL HIGH (ref 70–99)
Glucose-Capillary: 129 mg/dL — ABNORMAL HIGH (ref 70–99)

## 2019-05-25 MED ORDER — HYDROCOD POLST-CPM POLST ER 10-8 MG/5ML PO SUER
5.0000 mL | Freq: Two times a day (BID) | ORAL | Status: DC | PRN
  Administered 2019-05-25 – 2019-05-29 (×7): 5 mL via ORAL
  Filled 2019-05-25 (×7): qty 5

## 2019-05-25 NOTE — Plan of Care (Signed)

## 2019-05-25 NOTE — Progress Notes (Signed)
Subjective: He had another episode last night where he became much more short of breath and ended up being incontinent of urine had to have his oxygen turned up.  His leg looks better but he is more short of breath this morning.  Objective: Vital signs in last 24 hours: Temp:  [97.9 F (36.6 C)-98.5 F (36.9 C)] 97.9 F (36.6 C) (11/11 0637) Pulse Rate:  [98-106] 98 (11/11 0637) Resp:  [19-20] 20 (11/10 2100) BP: (120-128)/(66-80) 127/73 (11/11 0637) SpO2:  [92 %-100 %] 95 % (11/11 0826) Weight change:  Last BM Date: 05/22/19  Intake/Output from previous day: 11/10 0701 - 11/11 0700 In: 720 [P.O.:720] Out: 1120 [Urine:1120]  PHYSICAL EXAM General appearance: alert, cooperative and mild distress Resp: rhonchi bilaterally and wheezes bilaterally Cardio: regular rate and rhythm, S1, S2 normal, no murmur, click, rub or gallop GI: soft, non-tender; bowel sounds normal; no masses,  no organomegaly Extremities: His leg swelling is substantially better  Lab Results:  Results for orders placed or performed during the hospital encounter of 05/04/19 (from the past 48 hour(s))  Glucose, capillary     Status: None   Collection Time: 05/23/19 11:20 AM  Result Value Ref Range   Glucose-Capillary 86 70 - 99 mg/dL   Comment 1 Notify RN    Comment 2 Document in Chart   Glucose, capillary     Status: Abnormal   Collection Time: 05/23/19  4:21 PM  Result Value Ref Range   Glucose-Capillary 217 (H) 70 - 99 mg/dL   Comment 1 Notify RN    Comment 2 Document in Chart   Glucose, capillary     Status: Abnormal   Collection Time: 05/23/19  8:36 PM  Result Value Ref Range   Glucose-Capillary 231 (H) 70 - 99 mg/dL   Comment 1 Notify RN    Comment 2 Document in Chart   Glucose, capillary     Status: Abnormal   Collection Time: 05/24/19  7:58 AM  Result Value Ref Range   Glucose-Capillary 189 (H) 70 - 99 mg/dL  Glucose, capillary     Status: Abnormal   Collection Time: 05/24/19 11:33 AM   Result Value Ref Range   Glucose-Capillary 267 (H) 70 - 99 mg/dL  Glucose, capillary     Status: Abnormal   Collection Time: 05/24/19  4:24 PM  Result Value Ref Range   Glucose-Capillary 214 (H) 70 - 99 mg/dL   Comment 1 Notify RN    Comment 2 Document in Chart   Glucose, capillary     Status: Abnormal   Collection Time: 05/24/19  9:22 PM  Result Value Ref Range   Glucose-Capillary 146 (H) 70 - 99 mg/dL  Glucose, capillary     Status: Abnormal   Collection Time: 05/25/19  7:48 AM  Result Value Ref Range   Glucose-Capillary 174 (H) 70 - 99 mg/dL    ABGS No results for input(s): PHART, PO2ART, TCO2, HCO3 in the last 72 hours.  Invalid input(s): PCO2 CULTURES No results found for this or any previous visit (from the past 240 hour(s)). Studies/Results: Ct Abdomen Pelvis W Contrast  Result Date: 05/23/2019 CLINICAL DATA:  Persistent leg swelling, asymmetric, negative venous ultrasound of the lower extremities, question iliac vein thrombosis EXAM: CT ABDOMEN AND PELVIS WITH CONTRAST TECHNIQUE: Multidetector CT imaging of the abdomen and pelvis was performed using the standard protocol following bolus administration of intravenous contrast. Sagittal and coronal MPR images reconstructed from axial data set. CONTRAST:  163m OMNIPAQUE IOHEXOL 300 MG/ML  SOLN IV. No oral contrast administered. COMPARISON:  09/17/2018 FINDINGS: Lower chest: Emphysematous changes with bibasilar atelectasis greater on LEFT and central peribronchial thickening. Nodular density in lingula 7 mm diameter unchanged. Tiny LEFT pleural effusion. Hepatobiliary: Contracted gallbladder.  Liver unremarkable. Pancreas: Normal appearance Spleen: Normal appearance Adrenals/Urinary Tract: Adrenal glands, kidneys, ureters, and bladder normal appearance Stomach/Bowel: Appendix not visualized. Nonobstructed small bowel loop extends into a small umbilical hernia. Stomach and bowel loops otherwise normal appearance. Vascular/Lymphatic:  Atherosclerotic calcifications aorta and iliac arteries as well as coronary arteries. Aorta normal caliber. No adenopathy. IVC, common iliac, external iliac, and visualized common femoral veins patent through common femoral vein bifurcation. No deep venous thrombosis identified. Reproductive: Unremarkable Other: No free air or free fluid. In addition to small umbilical hernia containing a nonobstructed small bowel loop, a small LEFT inguinal hernia is seen containing fat. No inflammatory process. Musculoskeletal: Unremarkable IMPRESSION: No evidence of pelvic deep venous thrombosis. Small umbilical hernia containing a nonobstructed small bowel loop. Small LEFT inguinal hernia containing fat. Stable changes of COPD and scarring at lung bases with stable 7 mm lingular nodule. No acute abnormalities. Electronically Signed   By: Lavonia Dana M.D.   On: 05/23/2019 10:13    Medications:  Prior to Admission:  Medications Prior to Admission  Medication Sig Dispense Refill Last Dose  . albuterol (VENTOLIN HFA) 108 (90 BASE) MCG/ACT inhaler Inhale 2 puffs into the lungs every 6 (six) hours as needed for wheezing or shortness of breath.     . alfuzosin (UROXATRAL) 10 MG 24 hr tablet Take 10 mg by mouth daily with breakfast.   05/03/2019 at Unknown time  . azithromycin (ZITHROMAX) 500 MG tablet 1 Tab daily per Dr Ivan Anchors medication 30 tablet 5 05/03/2019 at Unknown time  . benzonatate (TESSALON) 200 MG capsule Take 200 mg by mouth 3 (three) times daily.   05/03/2019 at Unknown time  . budesonide (PULMICORT) 0.25 MG/2ML nebulizer solution Take 2 mLs (0.25 mg total) by nebulization 2 (two) times daily. 60 mL 12 05/03/2019 at Unknown time  . CARTIA XT 120 MG 24 hr capsule Take 1 capsule (120 mg total) by mouth daily. 7 capsule 0 05/03/2019 at Unknown time  . diphenhydrAMINE (BENADRYL) 25 mg capsule Take 25 mg by mouth every 8 (eight) hours as needed. Felt mouth swelling at ED visit 08/16/18.     [name]  doxycycline (VIBRAMYCIN) 100 MG capsule TK 1 C PO BID   05/03/2019 at Unknown time  . fexofenadine (ALLEGRA) 180 MG tablet Take 180 mg by mouth daily.   05/03/2019 at Unknown time  . fluticasone (FLONASE) 50 MCG/ACT nasal spray Place 2 sprays into both nostrils daily as needed.   5   . Fluticasone-Umeclidin-Vilant (TRELEGY ELLIPTA) 100-62.5-25 MCG/INH AEPB Inhale 1 puff into the lungs daily.   05/03/2019 at Unknown time  . furosemide (LASIX) 40 MG tablet Take 1 tablet (40 mg total) by mouth daily. As needed for swelling 30 tablet 11 05/03/2019 at Unknown time  . insulin glargine (LANTUS) 100 UNIT/ML injection Inject 0.4 mLs (40 Units total) into the skin daily. 24 units daily 10 mL 2 05/03/2019 at Unknown time  . insulin lispro (HUMALOG) 100 UNIT/ML injection Take by sliding scale provided by hospital maximum amount daily is 60 units.  Check blood sugar 4 times a day (Patient taking differently: Inject 0-15 Units into the skin 3 (three) times daily with meals. Take by sliding scale provided by hospital maximum amount daily is 60 units.  Check blood  sugar 4 times a day) 10 mL 11 05/03/2019 at Unknown time  . ipratropium (ATROVENT) 0.02 % nebulizer solution Take 0.5 mg by nebulization 2 (two) times daily.   5 05/03/2019 at Unknown time  . levalbuterol (XOPENEX) 0.63 MG/3ML nebulizer solution Take 0.63 mg by nebulization 2 (two) times daily.   05/03/2019 at Unknown time  . LORazepam (ATIVAN) 0.5 MG tablet Take 1 tablet (0.5 mg total) by mouth 2 (two) times daily as needed for anxiety or sleep. (Patient taking differently: Take 0.5 mg by mouth 4 (four) times daily. Pt takes it three times daily.) 20 tablet 0 05/03/2019 at Unknown time  . magnesium 30 MG tablet Take 30 mg by mouth daily.   05/03/2019 at Unknown time  . metFORMIN (GLUCOPHAGE) 500 MG tablet Take 1 tablet (500 mg total) by mouth daily with breakfast. 30 tablet 3 05/03/2019 at Unknown time  . montelukast (SINGULAIR) 10 MG tablet Take 1 tablet by  mouth daily.   05/03/2019 at Unknown time  . nitroGLYCERIN (NITROSTAT) 0.4 MG SL tablet Place 1 tablet (0.4 mg total) under the tongue every 5 (five) minutes as needed for chest pain. 25 tablet 4   . omeprazole (PRILOSEC) 20 MG capsule Take 1 capsule (20 mg total) by mouth 2 (two) times daily before a meal. 180 capsule 3 05/03/2019 at Unknown time  . oxyCODONE-acetaminophen (PERCOCET) 10-325 MG tablet Take 1 tablet by mouth every 4 (four) hours as needed for pain.   05/03/2019 at Unknown time  . potassium chloride SA (K-DUR,KLOR-CON) 20 MEQ tablet Take 1 tablet twice daily while on Lasix 60 tablet 2 05/03/2019 at Unknown time  . predniSONE (DELTASONE) 10 MG tablet Take 2 daily for 1 week then 1 daily (Patient taking differently: Take 20 mg by mouth daily with breakfast. Takes 2 tablets daily.) 60 tablet 5 05/03/2019 at Unknown time  . rifampin (RIFADIN) 300 MG capsule Take 2 capsules (600 mg total) by mouth daily. Long-term medication 60 capsule 11 05/03/2019 at Unknown time  . rosuvastatin (CRESTOR) 10 MG tablet Take 10 mg by mouth every morning.   11 05/03/2019 at Unknown time  . SYMBICORT 160-4.5 MCG/ACT inhaler INL 2 PFS PO BID   05/03/2019 at Unknown time  . tamsulosin (FLOMAX) 0.4 MG CAPS capsule Take 1 capsule (0.4 mg total) by mouth 2 (two) times daily. 60 capsule 2 05/03/2019 at Unknown time  . acetaminophen (TYLENOL) 325 MG tablet Take 2 tablets (650 mg total) by mouth every 6 (six) hours as needed for mild pain, moderate pain, fever or headache (or Fever >/= 101). (Patient not taking: Reported on 05/04/2019) 12 tablet 1 Not Taking at Unknown time   Scheduled: . azithromycin  500 mg Oral Daily  . benzonatate  200 mg Oral TID  . budesonide  0.25 mg Nebulization BID  . Clofazimine  100 mg Does not apply Daily  . diltiazem  120 mg Oral Daily  . feeding supplement (ENSURE ENLIVE)  237 mL Oral BID BM  . furosemide  80 mg Oral BID  . guaiFENesin  600 mg Oral BID  . heparin  5,000 Units  Subcutaneous Q8H  . insulin aspart  0-20 Units Subcutaneous TID WC  . insulin aspart  0-5 Units Subcutaneous QHS  . insulin aspart  18 Units Subcutaneous TID WC  . insulin glargine  65 Units Subcutaneous Daily  . ipratropium  0.5 mg Nebulization BID  . levalbuterol  1.25 mg Nebulization Q6H  . loratadine  10 mg Oral Daily  .  magic mouthwash  5 mL Oral TID PC & HS  . magnesium oxide  200 mg Oral Daily  . metFORMIN  500 mg Oral Q breakfast  . mometasone-formoterol  2 puff Inhalation BID  . mupirocin ointment   Nasal BID  . neomycin-bacitracin-polymyxin   Topical TID  . pantoprazole  40 mg Oral BID AC  . potassium chloride SA  40 mEq Oral Daily  . predniSONE  30 mg Oral Q breakfast  . rifampin  600 mg Oral Daily  . rosuvastatin  10 mg Oral q morning - 10a  . sodium chloride flush  3 mL Intravenous Q12H  . tamsulosin  0.4 mg Oral BID  . umeclidinium-vilanterol  1 puff Inhalation Daily   Continuous: . sodium chloride     QIH:KVQQVZ chloride, acetaminophen **OR** acetaminophen, diphenhydrAMINE, HYDROcodone-homatropine, levalbuterol, LORazepam, methocarbamol, nitroGLYCERIN, ondansetron **OR** ondansetron (ZOFRAN) IV, oxyCODONE-acetaminophen, polyethylene glycol, sodium chloride, sodium chloride flush, traZODone  Assesment: He was admitted with acute exacerbation of congestive heart failure.  He has had persistent swelling of his right leg but that is better.  He has COPD exacerbation and is having more trouble.  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  He has chronic pain on pain meds  He has chronic anxiety which is worse since he has been in the hospital Active Problems:   Type 2 diabetes mellitus without complication (HCC)   COPD exacerbation (HCC)   PAF (paroxysmal atrial fibrillation) (HCC)   Chronic respiratory failure with  hypoxia (HCC)   MAI (mycobacterium avium-intracellulare) ([place])   Lower extremity edema   Vision disorder   Acute on chronic respiratory failure with hypoxia (HCC)   GERD (gastroesophageal reflux disease)   Chronic pain   Anxiety   Dyspnea   Acute exacerbation of CHF (congestive heart failure) ([place])    Plan: Clearly not ready for discharge.  It has been recommended that he go to a skilled care facility but he does not want to do that because of concerns about Covid and being isolated from his family.    LOS: 21 days   Ryan [name] 05/25/2019, 8:41 AM

## 2019-05-25 NOTE — Care Management Important Message (Signed)
Important Message  Patient Details  Name: Ryan [name] MRN: [id number] Date of Birth: [birth year]   Medicare Important Message Given:  Yes     [name] 05/25/2019, 4:03 PM

## 2019-05-25 NOTE — TOC Progression Note (Signed)
Transition of Care [facility]) - Progression Note    Patient Details  Name: Ryan [name] MRN: [id number] Date of Birth: 08/30/1960  Transition of Care [facility]) CM/SW Contact  Shade Flood, LCSW Phone Number: 05/25/2019, 10:25 AM  Clinical Narrative:     TOC following with daily chart review. Pt not yet stable for dc. Plan remains for return to home with [facility] to follow. PT has recommended SNF but pt has not been agreeable to that idea.  TOC will follow and continue to assist with dc planning.  Expected Discharge Plan: [place] Barriers to Discharge: Continued Medical Work up  Expected Discharge Plan and Services Expected Discharge Plan: [place] In-house Referral: Clinical Social Work     Living arrangements for the past 2 months: Mobile Home                           HH Arranged: PT, RN [facility] Agency: [place] ([facility]) Date HH Agency Contacted: 05/16/19 Time [place]: [place] Representative spoke with at [place]: [place] ([place]) Interventions    Readmission Risk Interventions Readmission Risk Prevention Plan 05/06/2019  Transportation Screening Complete  Medication Review [employer] photographer) Complete  PCP or Specialist appointment within 3-5 days of discharge Not Complete  PCP/Specialist Appt Not Complete comments Dr. Luan Pulling staff schedule their own appointments for hospital dc follow up  [place] or [place] Patient refused  SW Recovery Care/Counseling Consult Complete  Skilled [place] Not Applicable  Some recent data might be hidden

## 2019-05-25 NOTE — Care Management Important Message (Signed)
Important Message  Patient Details  Name: Ryan [name] MRN: [id number] Date of Birth: 09-Feb-1961   Medicare Important Message Given:  Yes     [name] 05/25/2019, 12:01 PM

## 2019-05-26 ENCOUNTER — Encounter (HOSPITAL_COMMUNITY): Payer: Self-pay | Admitting: Primary Care

## 2019-05-26 DIAGNOSIS — J441 Chronic obstructive pulmonary disease with (acute) exacerbation: Secondary | ICD-10-CM

## 2019-05-26 DIAGNOSIS — Z7189 Other specified counseling: Secondary | ICD-10-CM

## 2019-05-26 DIAGNOSIS — J9611 Chronic respiratory failure with hypoxia: Secondary | ICD-10-CM

## 2019-05-26 DIAGNOSIS — G8929 Other chronic pain: Secondary | ICD-10-CM

## 2019-05-26 DIAGNOSIS — Z515 Encounter for palliative care: Secondary | ICD-10-CM

## 2019-05-26 LAB — GLUCOSE, CAPILLARY
Glucose-Capillary: 127 mg/dL — ABNORMAL HIGH (ref 70–99)
Glucose-Capillary: 217 mg/dL — ABNORMAL HIGH (ref 70–99)
Glucose-Capillary: 249 mg/dL — ABNORMAL HIGH (ref 70–99)
Glucose-Capillary: 164 mg/dL — ABNORMAL HIGH (ref 70–99)

## 2019-05-26 MED ORDER — METHYLPREDNISOLONE SODIUM SUCC 40 MG IJ SOLR
40.0000 mg | Freq: Two times a day (BID) | INTRAMUSCULAR | Status: DC
  Administered 2019-05-26 – 2019-05-29 (×7): 40 mg via INTRAVENOUS
  Filled 2019-05-26 (×7): qty 1

## 2019-05-26 NOTE — Plan of Care (Signed)

## 2019-05-26 NOTE — Progress Notes (Signed)
Subjective: He had another episode of severe shortness of breath hypoxia and loss of control of his bladder.  Objective: Vital signs in last 24 hours: Temp:  [98.4 F (36.9 C)-98.5 F (36.9 C)] 98.4 F (36.9 C) (11/12 0600) Pulse Rate:  [98-102] 98 (11/12 0600) Resp:  [18] 18 (11/12 0600) BP: (119-126)/(70-84) 126/84 (11/12 0600) SpO2:  [94 %-96 %] 96 % (11/12 0600) Weight change:  Last BM Date: 05/22/19  Intake/Output from previous day: 11/11 0701 - 11/12 0700 In: 240 [P.O.:240] Out: 1450 [Urine:1450]  PHYSICAL EXAM General appearance: alert, cooperative and mild distress Resp: rhonchi bilaterally Cardio: regular rate and rhythm, S1, S2 normal, no murmur, click, rub or gallop GI: soft, non-tender; bowel sounds normal; no masses,  no organomegaly Extremities: Still has swelling of his right leg more than left but it is slowly improving  Lab Results:  Results for orders placed or performed during the hospital encounter of 05/04/19 (from the past 48 hour(s))  Glucose, capillary     Status: Abnormal   Collection Time: 05/24/19 11:33 AM  Result Value Ref Range   Glucose-Capillary 267 (H) 70 - 99 mg/dL  Glucose, capillary     Status: Abnormal   Collection Time: 05/24/19  4:24 PM  Result Value Ref Range   Glucose-Capillary 214 (H) 70 - 99 mg/dL   Comment 1 Notify RN    Comment 2 Document in Chart   Glucose, capillary     Status: Abnormal   Collection Time: 05/24/19  9:22 PM  Result Value Ref Range   Glucose-Capillary 146 (H) 70 - 99 mg/dL  Glucose, capillary     Status: Abnormal   Collection Time: 05/25/19  7:48 AM  Result Value Ref Range   Glucose-Capillary 174 (H) 70 - 99 mg/dL  Glucose, capillary     Status: Abnormal   Collection Time: 05/25/19 11:07 AM  Result Value Ref Range   Glucose-Capillary 245 (H) 70 - 99 mg/dL  Glucose, capillary     Status: Abnormal   Collection Time: 05/25/19  4:31 PM  Result Value Ref Range   Glucose-Capillary 179 (H) 70 - 99 mg/dL   Glucose, capillary     Status: Abnormal   Collection Time: 05/25/19  9:52 PM  Result Value Ref Range   Glucose-Capillary 129 (H) 70 - 99 mg/dL   Comment 1 Notify RN    Comment 2 Document in Chart     ABGS No results for input(s): PHART, PO2ART, TCO2, HCO3 in the last 72 hours.  Invalid input(s): PCO2 CULTURES No results found for this or any previous visit (from the past 240 hour(s)). Studies/Results: No results found.  Medications:  Prior to Admission:  Medications Prior to Admission  Medication Sig Dispense Refill Last Dose  . albuterol (VENTOLIN HFA) 108 (90 BASE) MCG/ACT inhaler Inhale 2 puffs into the lungs every 6 (six) hours as needed for wheezing or shortness of breath.     . alfuzosin (UROXATRAL) 10 MG 24 hr tablet Take 10 mg by mouth daily with breakfast.   05/03/2019 at Unknown time  . azithromycin (ZITHROMAX) 500 MG tablet 1 Tab daily per Dr Ivan Anchors medication 30 tablet 5 05/03/2019 at Unknown time  . benzonatate (TESSALON) 200 MG capsule Take 200 mg by mouth 3 (three) times daily.   05/03/2019 at Unknown time  . budesonide (PULMICORT) 0.25 MG/2ML nebulizer solution Take 2 mLs (0.25 mg total) by nebulization 2 (two) times daily. 60 mL 12 05/03/2019 at Unknown time  . CARTIA XT 120 MG  24 hr capsule Take 1 capsule (120 mg total) by mouth daily. 7 capsule 0 05/03/2019 at Unknown time  . diphenhydrAMINE (BENADRYL) 25 mg capsule Take 25 mg by mouth every 8 (eight) hours as needed. Felt mouth swelling at ED visit 08/16/18.     [name] doxycycline (VIBRAMYCIN) 100 MG capsule TK 1 C PO BID   05/03/2019 at Unknown time  . fexofenadine (ALLEGRA) 180 MG tablet Take 180 mg by mouth daily.   05/03/2019 at Unknown time  . fluticasone (FLONASE) 50 MCG/ACT nasal spray Place 2 sprays into both nostrils daily as needed.   5   . Fluticasone-Umeclidin-Vilant (TRELEGY ELLIPTA) 100-62.5-25 MCG/INH AEPB Inhale 1 puff into the lungs daily.   05/03/2019 at Unknown time  . furosemide (LASIX)  40 MG tablet Take 1 tablet (40 mg total) by mouth daily. As needed for swelling 30 tablet 11 05/03/2019 at Unknown time  . insulin glargine (LANTUS) 100 UNIT/ML injection Inject 0.4 mLs (40 Units total) into the skin daily. 24 units daily 10 mL 2 05/03/2019 at Unknown time  . insulin lispro (HUMALOG) 100 UNIT/ML injection Take by sliding scale provided by hospital maximum amount daily is 60 units.  Check blood sugar 4 times a day (Patient taking differently: Inject 0-15 Units into the skin 3 (three) times daily with meals. Take by sliding scale provided by hospital maximum amount daily is 60 units.  Check blood sugar 4 times a day) 10 mL 11 05/03/2019 at Unknown time  . ipratropium (ATROVENT) 0.02 % nebulizer solution Take 0.5 mg by nebulization 2 (two) times daily.   5 05/03/2019 at Unknown time  . levalbuterol (XOPENEX) 0.63 MG/3ML nebulizer solution Take 0.63 mg by nebulization 2 (two) times daily.   05/03/2019 at Unknown time  . LORazepam (ATIVAN) 0.5 MG tablet Take 1 tablet (0.5 mg total) by mouth 2 (two) times daily as needed for anxiety or sleep. (Patient taking differently: Take 0.5 mg by mouth 4 (four) times daily. Pt takes it three times daily.) 20 tablet 0 05/03/2019 at Unknown time  . magnesium 30 MG tablet Take 30 mg by mouth daily.   05/03/2019 at Unknown time  . metFORMIN (GLUCOPHAGE) 500 MG tablet Take 1 tablet (500 mg total) by mouth daily with breakfast. 30 tablet 3 05/03/2019 at Unknown time  . montelukast (SINGULAIR) 10 MG tablet Take 1 tablet by mouth daily.   05/03/2019 at Unknown time  . nitroGLYCERIN (NITROSTAT) 0.4 MG SL tablet Place 1 tablet (0.4 mg total) under the tongue every 5 (five) minutes as needed for chest pain. 25 tablet 4   . omeprazole (PRILOSEC) 20 MG capsule Take 1 capsule (20 mg total) by mouth 2 (two) times daily before a meal. 180 capsule 3 05/03/2019 at Unknown time  . oxyCODONE-acetaminophen (PERCOCET) 10-325 MG tablet Take 1 tablet by mouth every 4 (four)  hours as needed for pain.   05/03/2019 at Unknown time  . potassium chloride SA (K-DUR,KLOR-CON) 20 MEQ tablet Take 1 tablet twice daily while on Lasix 60 tablet 2 05/03/2019 at Unknown time  . predniSONE (DELTASONE) 10 MG tablet Take 2 daily for 1 week then 1 daily (Patient taking differently: Take 20 mg by mouth daily with breakfast. Takes 2 tablets daily.) 60 tablet 5 05/03/2019 at Unknown time  . rifampin (RIFADIN) 300 MG capsule Take 2 capsules (600 mg total) by mouth daily. Long-term medication 60 capsule 11 05/03/2019 at Unknown time  . rosuvastatin (CRESTOR) 10 MG tablet Take 10 mg by mouth every morning.  11 05/03/2019 at Unknown time  . SYMBICORT 160-4.5 MCG/ACT inhaler INL 2 PFS PO BID   05/03/2019 at Unknown time  . tamsulosin (FLOMAX) 0.4 MG CAPS capsule Take 1 capsule (0.4 mg total) by mouth 2 (two) times daily. 60 capsule 2 05/03/2019 at Unknown time  . acetaminophen (TYLENOL) 325 MG tablet Take 2 tablets (650 mg total) by mouth every 6 (six) hours as needed for mild pain, moderate pain, fever or headache (or Fever >/= 101). (Patient not taking: Reported on 05/04/2019) 12 tablet 1 Not Taking at Unknown time   Scheduled: . azithromycin  500 mg Oral Daily  . benzonatate  200 mg Oral TID  . budesonide  0.25 mg Nebulization BID  . Clofazimine  100 mg Does not apply Daily  . diltiazem  120 mg Oral Daily  . feeding supplement (ENSURE ENLIVE)  237 mL Oral BID BM  . furosemide  80 mg Oral BID  . guaiFENesin  600 mg Oral BID  . heparin  5,000 Units Subcutaneous Q8H  . insulin aspart  0-20 Units Subcutaneous TID WC  . insulin aspart  0-5 Units Subcutaneous QHS  . insulin aspart  18 Units Subcutaneous TID WC  . insulin glargine  65 Units Subcutaneous Daily  . ipratropium  0.5 mg Nebulization BID  . levalbuterol  1.25 mg Nebulization Q6H  . loratadine  10 mg Oral Daily  . magic mouthwash  5 mL Oral TID PC & HS  . magnesium oxide  200 mg Oral Daily  . metFORMIN  500 mg Oral Q breakfast   . methylPREDNISolone (SOLU-MEDROL) injection  40 mg Intravenous Q12H  . mometasone-formoterol  2 puff Inhalation BID  . mupirocin ointment   Nasal BID  . neomycin-bacitracin-polymyxin   Topical TID  . pantoprazole  40 mg Oral BID AC  . potassium chloride SA  40 mEq Oral Daily  . rifampin  600 mg Oral Daily  . rosuvastatin  10 mg Oral q morning - 10a  . sodium chloride flush  3 mL Intravenous Q12H  . tamsulosin  0.4 mg Oral BID  . umeclidinium-vilanterol  1 puff Inhalation Daily   Continuous: . sodium chloride     GLO:VFIEPP chloride, acetaminophen **OR** acetaminophen, chlorpheniramine-HYDROcodone, diphenhydrAMINE, levalbuterol, LORazepam, methocarbamol, nitroGLYCERIN, ondansetron **OR** ondansetron (ZOFRAN) IV, oxyCODONE-acetaminophen, polyethylene glycol, sodium chloride, sodium chloride flush, traZODone  Assesment: He was admitted with acute exacerbation of heart failure.  He is improving but still has swelling of his leg.  I suspect that some of this is related to venous insufficiency although I could not demonstrate that.  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  He has chronic pain and is on pain medication  He has anxiety which complicates his situation  He has diabetes which has been difficult to control with steroids on board Active Problems:   Type 2 diabetes mellitus without complication (HCC)   COPD exacerbation (HCC)   PAF (paroxysmal atrial fibrillation) (HCC)   Chronic respiratory failure with hypoxia (HCC)   MAI (mycobacterium avium-intracellulare) ([place])   Lower extremity edema   Vision disorder   Acute on chronic respiratory failure with hypoxia (HCC)   GERD (gastroesophageal reflux disease)   Chronic pain   Anxiety   Dyspnea   Acute exacerbation of CHF (congestive  heart failure) ([place])  Plan: I would restart IV steroids.  Request palliative medicine consult.    LOS: 22 days   Alonza Bogus 05/26/2019, 9:00 AM

## 2019-05-26 NOTE — Consult Note (Signed)
Consultation Note Date: 05/26/2019   Patient Name: Ryan [name]  DOB: 10/26/1960  MRN: [id number]  Age / Sex: 58 y.o., male  PCP: [name], MD Referring Physician: [name], MD  Reason for Consultation: Establishing goals of care and Psychosocial/spiritual support  HPI/Patient Profile: 58 y.o. male  with past medical history of COPD on home O2, heart failure, A. fib, CAD, degenerative disc disease, GERD, cavitary lesion of lung, Barrett's esophagus, smoker, type 2 diabetes, admitted on 05/04/2019 with acute exacerbation of heart failure.   Clinical Assessment and Goals of Care: I have reviewed medical records including EPIC notes, labs and imaging, received report from bedside nursing staff, assessed the patient and then met at the bedside along with wife Ryan [name] via phone to discuss diagnosis prognosis, [place], EOL wishes, disposition and options.  I introduced Palliative Medicine as specialized medical care for people living with serious illness. It focuses on providing relief from the symptoms and stress of a serious illness. The goal is to improve quality of life for both the patient and the family.  We discussed a brief life review of the patient.  Ryan [name] is originally from [place], he and his wife have been married for 30+ years.  They moved down here when their children were young.  He has a son Ryan [name] who lives in the home, and a daughter Ryan [name].  As far as functional and nutritional status, Ryan [name] states that he is able to complete his own ADLs, his son Ryan [name] is available to help around the house with chores.  Wife Ryan [name] is working at [employer].  We discussed his current illness and what it means in the larger context of on-going co-morbidities.  Natural disease trajectory and expectations at EOL were discussed.  I share a diagram of the chronic illness pathway, what is normal  and expected including recurrent illness and weakness.  I attempted to elicit values and goals of care important to the patient.  Ryan [name] tells me that he prefers to go home, he feels that he has the help he needs at home.  The difference between aggressive medical intervention and comfort care was considered in light of the patient's goals of care.  We talked about healthcare power of attorney, see below we talked about CODE STATUS, see below.  We talked about the benefits of at home, treat the treatable hospice.  We talked about support through aide to help with bathing, registered nurse evaluations regularly, building a trusting relationship.  We also talked about the benefits of symptom management that hospice can provide.   I believe Ryan [name] would benefit from low-dose by mouth morphine liquid 2.6 mg every 4-6 hours as needed for breathlessness.  He is agreeable, but I share the difficulties in prescribing morphine if he is continuing to desire full code/full scope.  We discussed liberalization of symptom management if he chooses hospice.  Advanced directives, concepts specific to code status, artifical feeding and hydration, and rehospitalization were considered and discussed.  Ryan [name]  and his wife are considering future choices.  Hospice and Palliative Care services outpatient were explained and offered.  Ryan [name] would likely qualify for hospice at this time.  Questions and concerns were addressed.  The family was encouraged to call with questions or concerns.   HCPOA     NEXT OF KIN - spouse Ryan [name].    SUMMARY OF RECOMMENDATIONS   Continue to treat the treatable Goal is to return home, not rehab Rehospitalize as needed  Code Status/Advance Care Planning:  Full code -we talked about the concept of "treat the treatable, but allowing natural death".  We talked about the realities of CPR including survival rates.  I asked Ryan [name] to consider, if he were to be on life support,  for how long?  2 weeks, 2 months, would he want tracheostomy/PEG tube.  Wife is on phone to hear this conversation.  Symptom Management:   Per attending, no additional needs at this time.  I believe Ryan [name] would benefit from low-dose by mouth morphine liquid 2.6 mg every 4-6 hours as needed for breathlessness.  He is agreeable, but I share the difficulties in prescribing morphine if he is continuing to desire full code/full scope.  We discussed liberalization of symptom management if he chooses hospice.  Palliative Prophylaxis:   Frequent Pain Assessment and Oral Care  Additional Recommendations (Limitations, Scope, Preferences):  Full Scope Treatment  Psycho-social/Spiritual:   Desire for further Chaplaincy support:no  Additional Recommendations: Caregiving  Support/Resources and Education on Hospice  Prognosis:   Unable to determine, based on outcomes.  Discharge Planning: To be determined, based on outcomes.  Ryan [name] states that he prefers to go home with home health.      Primary Diagnoses: Present on Admission: . Dyspnea . PAF (paroxysmal atrial fibrillation) ([place]) . MAI (mycobacterium avium-intracellulare) ([facility]) . Lower extremity edema . GERD (gastroesophageal reflux disease) . COPD exacerbation ([place]) . Chronic respiratory failure with hypoxia ([place]) . Chronic pain . Anxiety . Acute on chronic respiratory failure with hypoxia ([place])   I have reviewed the medical record, interviewed the patient and family, and examined the patient. The following aspects are pertinent.  Past Medical History:  Diagnosis Date  . Atrial fibrillation ([place])    Not anticoagulated  . Barrett's esophagus   . Borderline diabetes   . Bronchitis   . CAP (community acquired pneumonia) 07/10/2013   04/2015  . 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 Chronic back pain   . Chronic diastolic CHF (congestive heart failure) ([place])   . Chronic left  shoulder pain   . Chronic neck pain   . Chronic respiratory failure ([place])   . Collagen vascular disease ([place])   . COPD (chronic obstructive pulmonary disease) ([place])   . Coronary atherosclerosis of native coronary artery    Mild nonobstructive disease at catheterization 2007  . DDD (degenerative disc disease)    Cervical and thoracic  . Diastolic heart failure ([place])   . GERD (gastroesophageal reflux disease)   . History of pneumonia   . Hypercholesteremia   . On home O2    6L N/C  . Polycythemia   . Seizures ([place])    Last seizure 2 yrs ago  . Smoker   . Type 2 diabetes mellitus ([place])   . Type 2 diabetes mellitus without complication ([facility])    Social History   Socioeconomic History  . Marital status: Married    Spouse name: Ryan [name]  .  Number of children: 4  . Years of education: 10  . Highest education level: Not on file  Occupational History  . Not on file  Social Needs  . Financial resource strain: Not on file  . Food insecurity    Worry: Not on file    Inability: Not on file  . Transportation needs    Medical: Not on file    Non-medical: Not on file  Tobacco Use  . Smoking status: Former Smoker    Packs/day: 0.50    Years: 45.00    Pack years: 22.50    Types: Cigarettes    Quit date: 05/01/2015    Years since quitting: 4.0  . Smokeless tobacco: Never Used  Substance and Sexual Activity  . Alcohol use: No    Alcohol/week: 0.0 standard drinks  . Drug use: No  . Sexual activity: Yes    Birth control/protection: Surgical  Lifestyle  . Physical activity    Days per week: Not on file    Minutes per session: Not on file  . Stress: Not on file  Relationships  . Social Herbalist on phone: Not on file    Gets together: Not on file    Attends religious service: Not on file    Active member of club or organization: Not on file    Attends meetings of clubs or organizations: Not on file    Relationship status: Not on file  Other Topics Concern  . Not  on file  Social History Narrative   Patient is married Ryan [name]) and lives at home with his wife and one child.   Patient has four children.   Patient is disabled.   Patient has a high school education.   Patient is left-handed.   Patient does not drink any caffeine.   Family History  Problem Relation Age of Onset  . Hypertension Mother   . Diabetes Mother   . Heart attack Mother   . Heart failure Mother   . Hypertension Sister   . Diabetes Sister   . Heart failure Sister   . Colon cancer Neg Hx    Scheduled Meds: . azithromycin  500 mg Oral Daily  . benzonatate  200 mg Oral TID  . budesonide  0.25 mg Nebulization BID  . Clofazimine  100 mg Does not apply Daily  . diltiazem  120 mg Oral Daily  . feeding supplement (ENSURE ENLIVE)  237 mL Oral BID BM  . furosemide  80 mg Oral BID  . guaiFENesin  600 mg Oral BID  . heparin  5,000 Units Subcutaneous Q8H  . insulin aspart  0-20 Units Subcutaneous TID WC  . insulin aspart  0-5 Units Subcutaneous QHS  . insulin aspart  18 Units Subcutaneous TID WC  . insulin glargine  65 Units Subcutaneous Daily  . ipratropium  0.5 mg Nebulization BID  . levalbuterol  1.25 mg Nebulization Q6H  . loratadine  10 mg Oral Daily  . magic mouthwash  5 mL Oral TID PC & HS  . magnesium oxide  200 mg Oral Daily  . metFORMIN  500 mg Oral Q breakfast  . methylPREDNISolone (SOLU-MEDROL) injection  40 mg Intravenous Q12H  . mometasone-formoterol  2 puff Inhalation BID  . mupirocin ointment   Nasal BID  . neomycin-bacitracin-polymyxin   Topical TID  . pantoprazole  40 mg Oral BID AC  . potassium chloride SA  40 mEq Oral Daily  . rifampin  600 mg Oral Daily  .  rosuvastatin  10 mg Oral q morning - 10a  . sodium chloride flush  3 mL Intravenous Q12H  . tamsulosin  0.4 mg Oral BID  . umeclidinium-vilanterol  1 puff Inhalation Daily   Continuous Infusions: . sodium chloride     PRN Meds:.sodium chloride, acetaminophen **OR** acetaminophen,  chlorpheniramine-HYDROcodone, diphenhydrAMINE, levalbuterol, LORazepam, methocarbamol, nitroGLYCERIN, ondansetron **OR** ondansetron (ZOFRAN) IV, oxyCODONE-acetaminophen, polyethylene glycol, sodium chloride, sodium chloride flush, traZODone Medications Prior to Admission:  Prior to Admission medications   Medication Sig Start Date End Date Taking? Authorizing Provider  albuterol (VENTOLIN HFA) 108 (90 BASE) MCG/ACT inhaler Inhale 2 puffs into the lungs every 6 (six) hours as needed for wheezing or shortness of breath.   Yes [provider]  alfuzosin (UROXATRAL) 10 MG 24 hr tablet Take 10 mg by mouth daily with breakfast.   Yes [provider]  azithromycin (ZITHROMAX) 500 MG tablet 1 Tab daily per Dr Ivan Anchors medication 09/20/18  Yes Emokpae, Courage, MD  benzonatate (TESSALON) 200 MG capsule Take 200 mg by mouth 3 (three) times daily.   Yes [provider]  budesonide (PULMICORT) 0.25 MG/2ML nebulizer solution Take 2 mLs (0.25 mg total) by nebulization 2 (two) times daily. 10/24/17  Yes [name], MD  CARTIA XT 120 MG 24 hr capsule Take 1 capsule (120 mg total) by mouth daily. 01/01/18  Yes BranchAlphonse Guild, MD  diphenhydrAMINE (BENADRYL) 25 mg capsule Take 25 mg by mouth every 8 (eight) hours as needed. Felt mouth swelling at ED visit 08/16/18.   Yes [provider]  doxycycline (VIBRAMYCIN) 100 MG capsule TK 1 C PO BID 09/22/18  Yes [provider]  fexofenadine (ALLEGRA) 180 MG tablet Take 180 mg by mouth daily.   Yes [provider]  fluticasone (FLONASE) 50 MCG/ACT nasal spray Place 2 sprays into both nostrils daily as needed.  02/24/18  Yes [provider]  Fluticasone-Umeclidin-Vilant (TRELEGY ELLIPTA) 100-62.5-25 MCG/INH AEPB Inhale 1 puff into the lungs daily.   Yes [provider]  furosemide (LASIX) 40 MG tablet Take 1 tablet (40 mg total) by mouth daily. As needed for swelling 09/10/18 09/10/19 Yes [name], MD  insulin glargine (LANTUS) 100 UNIT/ML injection Inject 0.4 mLs (40 Units total) into the skin daily. 24 units daily 09/19/18  Yes Emokpae, Courage, MD  insulin lispro (HUMALOG) 100 UNIT/ML injection Take by sliding scale provided by hospital maximum amount daily is 60 units.  Check blood sugar 4 times a day Patient taking differently: Inject 0-15 Units into the skin 3 (three) times daily with meals. Take by sliding scale provided by hospital maximum amount daily is 60 units.  Check blood sugar 4 times a day 09/23/17  Yes [name], MD  ipratropium (ATROVENT) 0.02 % nebulizer solution Take 0.5 mg by nebulization 2 (two) times daily.  09/29/17  Yes [provider]  levalbuterol Penne Lash) 0.63 MG/3ML nebulizer solution Take 0.63 mg by nebulization 2 (two) times daily.   Yes [provider]  LORazepam (ATIVAN) 0.5 MG tablet Take 1 tablet (0.5 mg total) by mouth 2 (two) times daily as needed for anxiety or sleep. Patient taking differently: Take 0.5 mg by mouth 4 (four) times daily. Pt takes it three times daily. 05/04/15  Yes Mikhail, Maryann, DO  magnesium 30 MG tablet Take 30 mg by mouth daily.   Yes [provider]  metFORMIN (GLUCOPHAGE) 500 MG tablet Take 1 tablet (500 mg total) by mouth daily with breakfast. 09/15/14  Yes Rexene Alberts, MD  montelukast (SINGULAIR) 10 MG tablet Take 1 tablet by mouth daily. 02/17/19  Yes [provider]  nitroGLYCERIN (NITROSTAT) 0.4 MG SL tablet Place 1 tablet (0.4 mg total) under the tongue every 5 (five) minutes as needed for chest pain. 04/22/13  Yes Lendon Colonel, NP  omeprazole (PRILOSEC) 20 MG capsule Take 1 capsule (20 mg total) by mouth 2 (two) times daily before a meal. 08/04/18  Yes Annitta Needs, NP  oxyCODONE-acetaminophen (PERCOCET) 10-325 MG tablet Take 1 tablet by mouth every 4 (four) hours as needed for pain.   Yes [provider]  potassium chloride SA (K-DUR,KLOR-CON) 20 MEQ tablet Take 1  tablet twice daily while on Lasix 09/10/18  Yes [name], MD  predniSONE (DELTASONE) 10 MG tablet Take 2 daily for 1 week then 1 daily Patient taking differently: Take 20 mg by mouth daily with breakfast. Takes 2 tablets daily. 10/24/17  Yes [name], MD  rifampin (RIFADIN) 300 MG capsule Take 2 capsules (600 mg total) by mouth daily. Long-term medication 09/19/18  Yes Emokpae, Courage, MD  rosuvastatin (CRESTOR) 10 MG tablet Take 10 mg by mouth every morning.  04/10/18  Yes [provider]  SYMBICORT 160-4.5 MCG/ACT inhaler INL 2 PFS PO BID 09/23/18  Yes [provider]  tamsulosin (FLOMAX) 0.4 MG CAPS capsule Take 1 capsule (0.4 mg total) by mouth 2 (two) times daily. 09/19/18  Yes Roxan Hockey, MD  acetaminophen (TYLENOL) 325 MG tablet Take 2 tablets (650 mg total) by mouth every 6 (six) hours as needed for mild pain, moderate pain, fever or headache (or Fever >/= 101). Patient not taking: Reported on 05/04/2019 09/19/18   Roxan Hockey, MD   Allergies  Allergen Reactions  . Albuterol Palpitations  . 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 Influenza Vaccine Live Swelling   Review of Systems  Unable to perform ROS: Acuity of condition    Physical Exam Vitals signs and nursing note reviewed.  Constitutional:      General: He is not in acute distress.    Appearance: He is normal weight. He is ill-appearing.  HENT:     Head: Normocephalic and atraumatic.  Cardiovascular:     Rate and Rhythm: Normal rate.  Pulmonary:     Effort: Pulmonary effort is normal. No accessory muscle usage.  Musculoskeletal:     Right lower leg: Edema present.     Left lower leg: Edema present.  Skin:    General: Skin is warm and dry.     Comments: Multiple small wound in various stages of healing BL arms.   Neurological:     Mental Status: He is alert and oriented to person, place, and time.  Psychiatric:         Mood and Affect: Mood is not anxious.        Behavior: Behavior is not agitated.     Vital Signs: BP 112/69 (BP Location: Right Arm)   Pulse (!) 106   Temp 98.7 F (37.1 C) (Oral)   Resp (!) 24   Ht '5\' 8"'  (1.727 m)   Wt 85.7 kg   SpO2 96%   BMI 28.74 kg/m  Pain Scale: 0-10 POSS *See Group Information*: 1-Acceptable,Awake and alert Pain Score: 8    SpO2: SpO2: 96 % O2 Device:SpO2: 96 % O2 Flow Rate: .O2 Flow Rate (L/min): 9 L/min  IO: Intake/output summary:   Intake/Output Summary (Last 24 hours) at 05/26/2019  [place] filed at 05/26/2019 1300 Gross per 24 hour  Intake 758 ml  Output 2351 ml  Net -1593 ml    LBM: Last BM Date: 05/22/19 Baseline Weight: Weight: 81.6 kg Most recent weight: Weight: 85.7 kg     Palliative Assessment/Data:   Flowsheet Rows     Most Recent Value  Intake Tab  Referral Department  Hospitalist  Unit at Time of Referral  Cardiac/Telemetry Unit  Palliative Care Primary Diagnosis  Pulmonary  Date Notified  05/26/19  Palliative Care Type  New Palliative care  Reason for referral  Clarify Goals of Care  Date of Admission  05/04/19  Date first seen by Palliative Care  05/26/19  # of days Palliative referral response time  0 Day(s)  # of days IP prior to Palliative referral  22  Clinical Assessment  Palliative Performance Scale Score  50%  Pain Max last 24 hours  Not able to report  Pain Min Last 24 hours  Not able to report  Dyspnea Max Last 24 Hours  Not able to report  Dyspnea Min Last 24 hours  Not able to report  Psychosocial & Spiritual Assessment  Palliative Care Outcomes      Time In: 0840 Time Out: 0950  Time Total: 70 minutes  Greater than 50%  of this time was spent counseling and coordinating care related to the above assessment and plan.  Signed by: Drue Novel, NP   Please contact Palliative Medicine Team phone at [phone] for questions and concerns.  For individual provider: See Shea Evans

## 2019-05-26 NOTE — Progress Notes (Signed)
Physical Therapy Treatment Patient Details Name: Ryan [name] MRN: [id number] DOB: [birth year] Today's Date: 05/26/2019    History of Present Illness Ryan [name]  is a 58 y.o. male with past medical history relevant for chronic hypoxic respiratory failure on 6 L of oxygen at home, DM, HTN, COPD, CHF, HLD and GERD who presents to the ED with worsening shortness of breath and cough-worsening hypoxia, worsening bilateral lower extremity edema, dyspnea at rest and dyspnea on exertion    PT Comments    Patient agreeable for therapy in PM and has visitor in room.  Patient demonstrates good return for completing BLE ROM/strengthening exercises while seated at bedside, requires frequent rest breaks and O2 increased due to SOB and declined to attempt sit to stands or walking due to fatigue/SOB.  Patient tolerated sitting up at bedside with visitor present in room after therapy.  Patient will benefit from continued physical therapy in hospital and recommended venue below to increase strength, balance, endurance for safe ADLs and gait.   Follow Up Recommendations  SNF;Supervision for mobility/OOB;Supervision - Intermittent     Equipment Recommendations  None recommended by PT    Recommendations for Other Services       Precautions / Restrictions Precautions Precautions: Fall Precaution Comments: Home O2 dependent 6 LPM Restrictions Weight Bearing Restrictions: No    Mobility  Bed Mobility Overal bed mobility: Independent                Transfers                    Ambulation/Gait                 Stairs             Wheelchair Mobility    Modified Rankin (Stroke Patients Only)       Balance Overall balance assessment: Needs assistance Sitting-balance support: Feet supported;No upper extremity supported Sitting balance-Leahy Scale: Good Sitting balance - Comments: Seated at EOB                                    Cognition  Arousal/Alertness: Awake/alert Behavior During Therapy: WFL for tasks assessed/performed Overall Cognitive Status: Within Functional Limits for tasks assessed                                        Exercises General Exercises - Lower Extremity Long Arc Quad: Seated;AROM;Strengthening;Both;10 reps Hip Flexion/Marching: Seated;AROM;Strengthening;Both;10 reps Toe Raises: Seated;AROM;Strengthening;Both;10 reps Heel Raises: Seated;AROM;Strengthening;Both;10 reps    General Comments        Pertinent Vitals/Pain Pain Assessment: Faces Faces Pain Scale: Hurts little more Pain Location: right foot Pain Descriptors / Indicators: Sore Pain Intervention(s): Limited activity within patient's tolerance;Monitored during session    Home Living                      Prior Function            PT Goals (current goals can now be found in the care plan section) Acute Rehab PT Goals Patient Stated Goal: Return home PT Goal Formulation: With patient Time For Goal Achievement: 05/30/19 Potential to Achieve Goals: Good Progress towards PT goals: Not progressing toward goals - comment(Patient limited secondary to SOB)    Frequency    Min 2X/week  PT Plan Current plan remains appropriate    Co-evaluation              AM-PAC PT "6 Clicks" Mobility   Outcome Measure  Help needed turning from your back to your side while in a flat bed without using bedrails?: None Help needed moving from lying on your back to sitting on the side of a flat bed without using bedrails?: None Help needed moving to and from a bed to a chair (including a wheelchair)?: A Little Help needed standing up from a chair using your arms (e.g., wheelchair or bedside chair)?: A Little Help needed to walk in hospital room?: A Little Help needed climbing 3-5 steps with a railing? : A Lot 6 Click Score: 19    End of Session Equipment Utilized During Treatment: Oxygen Activity  Tolerance: Patient tolerated treatment well;Patient limited by fatigue Patient left: in bed;with call bell/phone within reach;with family/visitor present Nurse Communication: Mobility status PT Visit Diagnosis: Unsteadiness on feet (R26.81);Other abnormalities of gait and mobility (R26.89);Muscle weakness (generalized) (M62.81)     Time: 2334-3568 PT Time Calculation (min) (ACUTE ONLY): 19 min  Charges:  $Therapeutic Exercise: 8-22 mins                     4:03 PM, 05/26/19 Lonell Grandchild, MPT Physical Therapist with [facility] [phone] office [phone] mobile phone

## 2019-05-26 NOTE — Progress Notes (Signed)
PT Cancellation Note  Patient Details Name: Ryan [name] MRN: [id number] DOB: 1960-10-05   Cancelled Treatment:    Reason Eval/Treat Not Completed: Patient declined, no reason specified.  Patient declined therapy in AM secondary to c/o fatigue.  Will check back in PM if schedule permits.    11:31 AM, 05/26/19 Lonell Grandchild, MPT Physical Therapist with [facility] [phone] office [phone] mobile phone

## 2019-05-26 NOTE — Progress Notes (Signed)
Nutrition Follow-up  Nutrition Brief Note RD working remotely.  Per chart review, patient had another episode of severe SOB secondary to COPD exacerbation. Currently on 8L O2, increased from 6L baseline O2. MD noted that situation is complicated by his anxiety; palliative medicine consult pending.    Patient continues with good po intake during admission. He is on a heart healthy diet, consistently eating 75-100% of meals and drinking nutrition supplement. Will continue to follow.  Current wt 85.7 kg (188.5 lbs) +3 RLE; +2 LLE edema per review of RN edema assessment. Admit wt 83.4 kg (183.9 lb)  Medications reviewed and include: Zithromax, Ensure, Lasix, SSI, Novolog 18 units TID, Lantus 65 units daily, Metformin, Mag-ox, Methylprednisolone, Protonix, KCl Labs: CBGS 129-217   Lajuan Lines, RD, LDN Clinical Nutrition [facility] Telephone [phone] After Hours/Weekend Pager: [phone]

## 2019-05-27 LAB — GLUCOSE, CAPILLARY
Glucose-Capillary: 222 mg/dL — ABNORMAL HIGH (ref 70–99)
Glucose-Capillary: 321 mg/dL — ABNORMAL HIGH (ref 70–99)
Glucose-Capillary: 117 mg/dL — ABNORMAL HIGH (ref 70–99)
Glucose-Capillary: 115 mg/dL — ABNORMAL HIGH (ref 70–99)

## 2019-05-27 MED ORDER — CITALOPRAM HYDROBROMIDE 20 MG PO TABS
20.0000 mg | ORAL_TABLET | Freq: Every day | ORAL | Status: DC
  Administered 2019-05-27 – 2019-05-29 (×3): 20 mg via ORAL
  Filled 2019-05-27 (×3): qty 1

## 2019-05-27 NOTE — Care Management Important Message (Signed)
Important Message  Patient Details  Name: Ryan [name] MRN: [id number] Date of Birth: 1960-10-16   Medicare Important Message Given:  Yes     [name] 05/27/2019, 3:14 PM

## 2019-05-27 NOTE — Progress Notes (Signed)
Inpatient Diabetes Program Recommendations  AACE/ADA: New Consensus Statement on Inpatient Glycemic Control (2015)  Target Ranges:  Prepandial:   less than 140 mg/dL      Peak postprandial:   less than 180 mg/dL (1-2 hours)      Critically ill patients:  140 - 180 mg/dL   Lab Results  Component Value Date   GLUCAP 321 (H) 05/27/2019   HGBA1C 9.3 (H) 05/04/2019    Review of Glycemic Control  Results for ZYRELL, CARMEAN (MRN [id number]) as of 05/27/2019 13:15  Ref. Range 05/26/2019 21:46 05/27/2019 07:36 05/27/2019 11:19  Glucose-Capillary Latest Ref Range: 70 - 99 mg/dL 164 (H) 222 (H) 321 (H)   Diabetes history:DM2 Outpatient Diabetes medications:Lantus 40 units daily, Humalog 0-15 units TID with meals, Metformin 500 mg QAM Current orders for Inpatient glycemic control:Lantus65units daily, Novolog 12units TID with Meals, Novolog 0-20 units TID with meals, Novolog 0-5 units QHS, Metformin 500 mg QAM; Prednisone 40 mg QAM  Inpatient Diabetes Program Recommendations:  If steroids are continued, consider: - increasing meal coverage to Novolog20units TID with meals if patient eats at least 50% of meals. - Increasing Lantus to 70 units QD.   Thanks, Bronson Curb, MSN, RNC-OB Diabetes Coordinator [phone] (8a-5p)

## 2019-05-27 NOTE — Progress Notes (Signed)
Subjective: He is having another episode of shortness of breath.  He did not lose continence of urine this time.  I started him back on IV steroids yesterday.  Palliative care consult noted and appreciated.  He wants to continue full measures.  Objective: Vital signs in last 24 hours: Temp:  [97.7 F (36.5 C)-98.7 F (37.1 C)] 98 F (36.7 C) (11/13 0524) Pulse Rate:  [90-106] 90 (11/13 0524) Resp:  [20-24] 20 (11/13 0524) BP: (112-150)/(69-71) 127/70 (11/13 0524) SpO2:  [96 %-100 %] 100 % (11/13 0802) Weight change:  Last BM Date: 05/22/19  Intake/Output from previous day: 11/12 0701 - 11/13 0700 In: 918 [P.O.:918] Out: 2152 [Urine:2150; Stool:2]  PHYSICAL EXAM General appearance: alert and moderate distress Resp: rhonchi bilaterally Cardio: regular rate and rhythm, S1, S2 normal, no murmur, click, rub or gallop GI: soft, non-tender; bowel sounds normal; no masses,  no organomegaly Extremities: His leg edema is much better  Lab Results:  Results for orders placed or performed during the hospital encounter of 05/04/19 (from the past 48 hour(s))  Glucose, capillary     Status: Abnormal   Collection Time: 05/25/19 11:07 AM  Result Value Ref Range   Glucose-Capillary 245 (H) 70 - 99 mg/dL  Glucose, capillary     Status: Abnormal   Collection Time: 05/25/19  4:31 PM  Result Value Ref Range   Glucose-Capillary 179 (H) 70 - 99 mg/dL  Glucose, capillary     Status: Abnormal   Collection Time: 05/25/19  9:52 PM  Result Value Ref Range   Glucose-Capillary 129 (H) 70 - 99 mg/dL   Comment 1 Notify RN    Comment 2 Document in Chart   Glucose, capillary     Status: Abnormal   Collection Time: 05/26/19  7:46 AM  Result Value Ref Range   Glucose-Capillary 127 (H) 70 - 99 mg/dL  Glucose, capillary     Status: Abnormal   Collection Time: 05/26/19 11:52 AM  Result Value Ref Range   Glucose-Capillary 217 (H) 70 - 99 mg/dL  Glucose, capillary     Status: Abnormal   Collection Time:  05/26/19  4:36 PM  Result Value Ref Range   Glucose-Capillary 249 (H) 70 - 99 mg/dL  Glucose, capillary     Status: Abnormal   Collection Time: 05/26/19  9:46 PM  Result Value Ref Range   Glucose-Capillary 164 (H) 70 - 99 mg/dL  Glucose, capillary     Status: Abnormal   Collection Time: 05/27/19  7:36 AM  Result Value Ref Range   Glucose-Capillary 222 (H) 70 - 99 mg/dL    ABGS No results for input(s): PHART, PO2ART, TCO2, HCO3 in the last 72 hours.  Invalid input(s): PCO2 CULTURES No results found for this or any previous visit (from the past 240 hour(s)). Studies/Results: No results found.  Medications:  Prior to Admission:  Medications Prior to Admission  Medication Sig Dispense Refill Last Dose  . albuterol (VENTOLIN HFA) 108 (90 BASE) MCG/ACT inhaler Inhale 2 puffs into the lungs every 6 (six) hours as needed for wheezing or shortness of breath.     . alfuzosin (UROXATRAL) 10 MG 24 hr tablet Take 10 mg by mouth daily with breakfast.   05/03/2019 at Unknown time  . azithromycin (ZITHROMAX) 500 MG tablet 1 Tab daily per Dr Ivan Anchors medication 30 tablet 5 05/03/2019 at Unknown time  . benzonatate (TESSALON) 200 MG capsule Take 200 mg by mouth 3 (three) times daily.   05/03/2019 at Unknown time  .  budesonide (PULMICORT) 0.25 MG/2ML nebulizer solution Take 2 mLs (0.25 mg total) by nebulization 2 (two) times daily. 60 mL 12 05/03/2019 at Unknown time  . CARTIA XT 120 MG 24 hr capsule Take 1 capsule (120 mg total) by mouth daily. 7 capsule 0 05/03/2019 at Unknown time  . diphenhydrAMINE (BENADRYL) 25 mg capsule Take 25 mg by mouth every 8 (eight) hours as needed. Felt mouth swelling at ED visit 08/16/18.     [name] doxycycline (VIBRAMYCIN) 100 MG capsule TK 1 C PO BID   05/03/2019 at Unknown time  . fexofenadine (ALLEGRA) 180 MG tablet Take 180 mg by mouth daily.   05/03/2019 at Unknown time  . fluticasone (FLONASE) 50 MCG/ACT nasal spray Place 2 sprays into both nostrils daily as  needed.   5   . Fluticasone-Umeclidin-Vilant (TRELEGY ELLIPTA) 100-62.5-25 MCG/INH AEPB Inhale 1 puff into the lungs daily.   05/03/2019 at Unknown time  . furosemide (LASIX) 40 MG tablet Take 1 tablet (40 mg total) by mouth daily. As needed for swelling 30 tablet 11 05/03/2019 at Unknown time  . insulin glargine (LANTUS) 100 UNIT/ML injection Inject 0.4 mLs (40 Units total) into the skin daily. 24 units daily 10 mL 2 05/03/2019 at Unknown time  . insulin lispro (HUMALOG) 100 UNIT/ML injection Take by sliding scale provided by hospital maximum amount daily is 60 units.  Check blood sugar 4 times a day (Patient taking differently: Inject 0-15 Units into the skin 3 (three) times daily with meals. Take by sliding scale provided by hospital maximum amount daily is 60 units.  Check blood sugar 4 times a day) 10 mL 11 05/03/2019 at Unknown time  . ipratropium (ATROVENT) 0.02 % nebulizer solution Take 0.5 mg by nebulization 2 (two) times daily.   5 05/03/2019 at Unknown time  . levalbuterol (XOPENEX) 0.63 MG/3ML nebulizer solution Take 0.63 mg by nebulization 2 (two) times daily.   05/03/2019 at Unknown time  . LORazepam (ATIVAN) 0.5 MG tablet Take 1 tablet (0.5 mg total) by mouth 2 (two) times daily as needed for anxiety or sleep. (Patient taking differently: Take 0.5 mg by mouth 4 (four) times daily. Pt takes it three times daily.) 20 tablet 0 05/03/2019 at Unknown time  . magnesium 30 MG tablet Take 30 mg by mouth daily.   05/03/2019 at Unknown time  . metFORMIN (GLUCOPHAGE) 500 MG tablet Take 1 tablet (500 mg total) by mouth daily with breakfast. 30 tablet 3 05/03/2019 at Unknown time  . montelukast (SINGULAIR) 10 MG tablet Take 1 tablet by mouth daily.   05/03/2019 at Unknown time  . nitroGLYCERIN (NITROSTAT) 0.4 MG SL tablet Place 1 tablet (0.4 mg total) under the tongue every 5 (five) minutes as needed for chest pain. 25 tablet 4   . omeprazole (PRILOSEC) 20 MG capsule Take 1 capsule (20 mg total) by  mouth 2 (two) times daily before a meal. 180 capsule 3 05/03/2019 at Unknown time  . oxyCODONE-acetaminophen (PERCOCET) 10-325 MG tablet Take 1 tablet by mouth every 4 (four) hours as needed for pain.   05/03/2019 at Unknown time  . potassium chloride SA (K-DUR,KLOR-CON) 20 MEQ tablet Take 1 tablet twice daily while on Lasix 60 tablet 2 05/03/2019 at Unknown time  . predniSONE (DELTASONE) 10 MG tablet Take 2 daily for 1 week then 1 daily (Patient taking differently: Take 20 mg by mouth daily with breakfast. Takes 2 tablets daily.) 60 tablet 5 05/03/2019 at Unknown time  . rifampin (RIFADIN) 300 MG capsule Take  2 capsules (600 mg total) by mouth daily. Long-term medication 60 capsule 11 05/03/2019 at Unknown time  . rosuvastatin (CRESTOR) 10 MG tablet Take 10 mg by mouth every morning.   11 05/03/2019 at Unknown time  . SYMBICORT 160-4.5 MCG/ACT inhaler INL 2 PFS PO BID   05/03/2019 at Unknown time  . tamsulosin (FLOMAX) 0.4 MG CAPS capsule Take 1 capsule (0.4 mg total) by mouth 2 (two) times daily. 60 capsule 2 05/03/2019 at Unknown time  . acetaminophen (TYLENOL) 325 MG tablet Take 2 tablets (650 mg total) by mouth every 6 (six) hours as needed for mild pain, moderate pain, fever or headache (or Fever >/= 101). (Patient not taking: Reported on 05/04/2019) 12 tablet 1 Not Taking at Unknown time   Scheduled: . azithromycin  500 mg Oral Daily  . benzonatate  200 mg Oral TID  . budesonide  0.25 mg Nebulization BID  . Clofazimine  100 mg Does not apply Daily  . diltiazem  120 mg Oral Daily  . feeding supplement (ENSURE ENLIVE)  237 mL Oral BID BM  . furosemide  80 mg Oral BID  . guaiFENesin  600 mg Oral BID  . heparin  5,000 Units Subcutaneous Q8H  . insulin aspart  0-20 Units Subcutaneous TID WC  . insulin aspart  0-5 Units Subcutaneous QHS  . insulin aspart  18 Units Subcutaneous TID WC  . insulin glargine  65 Units Subcutaneous Daily  . ipratropium  0.5 mg Nebulization BID  . levalbuterol   1.25 mg Nebulization Q6H  . loratadine  10 mg Oral Daily  . magic mouthwash  5 mL Oral TID PC & HS  . magnesium oxide  200 mg Oral Daily  . metFORMIN  500 mg Oral Q breakfast  . methylPREDNISolone (SOLU-MEDROL) injection  40 mg Intravenous Q12H  . mometasone-formoterol  2 puff Inhalation BID  . mupirocin ointment   Nasal BID  . neomycin-bacitracin-polymyxin   Topical TID  . pantoprazole  40 mg Oral BID AC  . potassium chloride SA  40 mEq Oral Daily  . rifampin  600 mg Oral Daily  . rosuvastatin  10 mg Oral q morning - 10a  . sodium chloride flush  3 mL Intravenous Q12H  . tamsulosin  0.4 mg Oral BID  . umeclidinium-vilanterol  1 puff Inhalation Daily   Continuous: . sodium chloride     VZD:GLOVFI chloride, acetaminophen **OR** acetaminophen, chlorpheniramine-HYDROcodone, diphenhydrAMINE, levalbuterol, LORazepam, methocarbamol, nitroGLYCERIN, ondansetron **OR** ondansetron (ZOFRAN) IV, oxyCODONE-acetaminophen, polyethylene glycol, sodium chloride, sodium chloride flush, traZODone  Assesment: He was admitted with acute exacerbation of heart failure.  He still has some swelling of his right greater than left leg but it is much better.  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  He has severe anxiety which makes things worse Active Problems:   Type 2 diabetes mellitus without complication (HCC)   COPD exacerbation (HCC)   PAF (paroxysmal atrial fibrillation) (HCC)   Chronic respiratory failure with hypoxia (HCC)   MAI (mycobacterium avium-intracellulare) ([place])   Lower extremity edema   Vision disorder   Acute on chronic respiratory  failure with hypoxia (HCC)   GERD (gastroesophageal reflux disease)  Chronic pain   Anxiety   Dyspnea   Acute exacerbation of CHF (congestive heart failure) ([place])   Goals of care, counseling/discussion   Palliative care by specialist   DNR (do not resuscitate) discussion    Plan: Continue treatments including IV steroids for now.  The TED hose on his legs is helping    LOS: 23 days   Ryan [name] 05/27/2019, 8:08 AM

## 2019-05-28 LAB — GLUCOSE, CAPILLARY
Glucose-Capillary: 215 mg/dL — ABNORMAL HIGH (ref 70–99)
Glucose-Capillary: 256 mg/dL — ABNORMAL HIGH (ref 70–99)
Glucose-Capillary: 92 mg/dL (ref 70–99)
Glucose-Capillary: 104 mg/dL — ABNORMAL HIGH (ref 70–99)

## 2019-05-28 NOTE — Progress Notes (Signed)
Subjective: He feels a little bit better.  He did not have the TED hose placed yesterday but his legs still a little bit less swollen  Objective: Vital signs in last 24 hours: Temp:  [98.1 F (36.7 C)-98.2 F (36.8 C)] 98.1 F (36.7 C) (11/14 0601) Pulse Rate:  [88-110] 105 (11/14 0804) Resp:  [18-24] 20 (11/14 0804) BP: (114-121)/(67-81) 114/81 (11/14 0601) SpO2:  [91 %-99 %] 99 % (11/14 0813) Weight:  [84.9 kg] 84.9 kg (11/14 0620) Weight change:  Last BM Date: 05/27/19  Intake/Output from previous day: 11/13 0701 - 11/14 0700 In: 960 [P.O.:960] Out: 2075 [Urine:2075]  PHYSICAL EXAM General appearance: alert, cooperative and no distress Resp: rhonchi bilaterally Cardio: regular rate and rhythm, S1, S2 normal, no murmur, click, rub or gallop GI: soft, non-tender; bowel sounds normal; no masses,  no organomegaly Extremities: He still has edema of the right greater than left leg but it is improving  Lab Results:  Results for orders placed or performed during the hospital encounter of 05/04/19 (from the past 48 hour(s))  Glucose, capillary     Status: Abnormal   Collection Time: 05/26/19 11:52 AM  Result Value Ref Range   Glucose-Capillary 217 (H) 70 - 99 mg/dL  Glucose, capillary     Status: Abnormal   Collection Time: 05/26/19  4:36 PM  Result Value Ref Range   Glucose-Capillary 249 (H) 70 - 99 mg/dL  Glucose, capillary     Status: Abnormal   Collection Time: 05/26/19  9:46 PM  Result Value Ref Range   Glucose-Capillary 164 (H) 70 - 99 mg/dL  Glucose, capillary     Status: Abnormal   Collection Time: 05/27/19  7:36 AM  Result Value Ref Range   Glucose-Capillary 222 (H) 70 - 99 mg/dL  Glucose, capillary     Status: Abnormal   Collection Time: 05/27/19 11:19 AM  Result Value Ref Range   Glucose-Capillary 321 (H) 70 - 99 mg/dL  Glucose, capillary     Status: Abnormal   Collection Time: 05/27/19  4:08 PM  Result Value Ref Range   Glucose-Capillary 117 (H) 70 - 99  mg/dL   Comment 1 Notify RN    Comment 2 Document in Chart   Glucose, capillary     Status: Abnormal   Collection Time: 05/27/19  9:47 PM  Result Value Ref Range   Glucose-Capillary 115 (H) 70 - 99 mg/dL  Glucose, capillary     Status: Abnormal   Collection Time: 05/28/19  7:36 AM  Result Value Ref Range   Glucose-Capillary 215 (H) 70 - 99 mg/dL    ABGS No results for input(s): PHART, PO2ART, TCO2, HCO3 in the last 72 hours.  Invalid input(s): PCO2 CULTURES No results found for this or any previous visit (from the past 240 hour(s)). Studies/Results: No results found.  Medications:  Prior to Admission:  Medications Prior to Admission  Medication Sig Dispense Refill Last Dose  . albuterol (VENTOLIN HFA) 108 (90 BASE) MCG/ACT inhaler Inhale 2 puffs into the lungs every 6 (six) hours as needed for wheezing or shortness of breath.     . alfuzosin (UROXATRAL) 10 MG 24 hr tablet Take 10 mg by mouth daily with breakfast.   05/03/2019 at Unknown time  . azithromycin (ZITHROMAX) 500 MG tablet 1 Tab daily per Dr Ivan Anchors medication 30 tablet 5 05/03/2019 at Unknown time  . benzonatate (TESSALON) 200 MG capsule Take 200 mg by mouth 3 (three) times daily.   05/03/2019 at Unknown time  .  budesonide (PULMICORT) 0.25 MG/2ML nebulizer solution Take 2 mLs (0.25 mg total) by nebulization 2 (two) times daily. 60 mL 12 05/03/2019 at Unknown time  . CARTIA XT 120 MG 24 hr capsule Take 1 capsule (120 mg total) by mouth daily. 7 capsule 0 05/03/2019 at Unknown time  . diphenhydrAMINE (BENADRYL) 25 mg capsule Take 25 mg by mouth every 8 (eight) hours as needed. Felt mouth swelling at ED visit 08/16/18.     [name] doxycycline (VIBRAMYCIN) 100 MG capsule TK 1 C PO BID   05/03/2019 at Unknown time  . fexofenadine (ALLEGRA) 180 MG tablet Take 180 mg by mouth daily.   05/03/2019 at Unknown time  . fluticasone (FLONASE) 50 MCG/ACT nasal spray Place 2 sprays into both nostrils daily as needed.   5   .  Fluticasone-Umeclidin-Vilant (TRELEGY ELLIPTA) 100-62.5-25 MCG/INH AEPB Inhale 1 puff into the lungs daily.   05/03/2019 at Unknown time  . furosemide (LASIX) 40 MG tablet Take 1 tablet (40 mg total) by mouth daily. As needed for swelling 30 tablet 11 05/03/2019 at Unknown time  . insulin glargine (LANTUS) 100 UNIT/ML injection Inject 0.4 mLs (40 Units total) into the skin daily. 24 units daily 10 mL 2 05/03/2019 at Unknown time  . insulin lispro (HUMALOG) 100 UNIT/ML injection Take by sliding scale provided by hospital maximum amount daily is 60 units.  Check blood sugar 4 times a day (Patient taking differently: Inject 0-15 Units into the skin 3 (three) times daily with meals. Take by sliding scale provided by hospital maximum amount daily is 60 units.  Check blood sugar 4 times a day) 10 mL 11 05/03/2019 at Unknown time  . ipratropium (ATROVENT) 0.02 % nebulizer solution Take 0.5 mg by nebulization 2 (two) times daily.   5 05/03/2019 at Unknown time  . levalbuterol (XOPENEX) 0.63 MG/3ML nebulizer solution Take 0.63 mg by nebulization 2 (two) times daily.   05/03/2019 at Unknown time  . LORazepam (ATIVAN) 0.5 MG tablet Take 1 tablet (0.5 mg total) by mouth 2 (two) times daily as needed for anxiety or sleep. (Patient taking differently: Take 0.5 mg by mouth 4 (four) times daily. Pt takes it three times daily.) 20 tablet 0 05/03/2019 at Unknown time  . magnesium 30 MG tablet Take 30 mg by mouth daily.   05/03/2019 at Unknown time  . metFORMIN (GLUCOPHAGE) 500 MG tablet Take 1 tablet (500 mg total) by mouth daily with breakfast. 30 tablet 3 05/03/2019 at Unknown time  . montelukast (SINGULAIR) 10 MG tablet Take 1 tablet by mouth daily.   05/03/2019 at Unknown time  . nitroGLYCERIN (NITROSTAT) 0.4 MG SL tablet Place 1 tablet (0.4 mg total) under the tongue every 5 (five) minutes as needed for chest pain. 25 tablet 4   . omeprazole (PRILOSEC) 20 MG capsule Take 1 capsule (20 mg total) by mouth 2 (two) times  daily before a meal. 180 capsule 3 05/03/2019 at Unknown time  . oxyCODONE-acetaminophen (PERCOCET) 10-325 MG tablet Take 1 tablet by mouth every 4 (four) hours as needed for pain.   05/03/2019 at Unknown time  . potassium chloride SA (K-DUR,KLOR-CON) 20 MEQ tablet Take 1 tablet twice daily while on Lasix 60 tablet 2 05/03/2019 at Unknown time  . predniSONE (DELTASONE) 10 MG tablet Take 2 daily for 1 week then 1 daily (Patient taking differently: Take 20 mg by mouth daily with breakfast. Takes 2 tablets daily.) 60 tablet 5 05/03/2019 at Unknown time  . rifampin (RIFADIN) 300 MG capsule Take  2 capsules (600 mg total) by mouth daily. Long-term medication 60 capsule 11 05/03/2019 at Unknown time  . rosuvastatin (CRESTOR) 10 MG tablet Take 10 mg by mouth every morning.   11 05/03/2019 at Unknown time  . SYMBICORT 160-4.5 MCG/ACT inhaler INL 2 PFS PO BID   05/03/2019 at Unknown time  . tamsulosin (FLOMAX) 0.4 MG CAPS capsule Take 1 capsule (0.4 mg total) by mouth 2 (two) times daily. 60 capsule 2 05/03/2019 at Unknown time  . acetaminophen (TYLENOL) 325 MG tablet Take 2 tablets (650 mg total) by mouth every 6 (six) hours as needed for mild pain, moderate pain, fever or headache (or Fever >/= 101). (Patient not taking: Reported on 05/04/2019) 12 tablet 1 Not Taking at Unknown time   Scheduled: . azithromycin  500 mg Oral Daily  . benzonatate  200 mg Oral TID  . budesonide  0.25 mg Nebulization BID  . citalopram  20 mg Oral Daily  . Clofazimine  100 mg Does not apply Daily  . diltiazem  120 mg Oral Daily  . feeding supplement (ENSURE ENLIVE)  237 mL Oral BID BM  . furosemide  80 mg Oral BID  . guaiFENesin  600 mg Oral BID  . heparin  5,000 Units Subcutaneous Q8H  . insulin aspart  0-20 Units Subcutaneous TID WC  . insulin aspart  0-5 Units Subcutaneous QHS  . insulin aspart  18 Units Subcutaneous TID WC  . insulin glargine  65 Units Subcutaneous Daily  . ipratropium  0.5 mg Nebulization BID  .  levalbuterol  1.25 mg Nebulization Q6H  . loratadine  10 mg Oral Daily  . magic mouthwash  5 mL Oral TID PC & HS  . magnesium oxide  200 mg Oral Daily  . metFORMIN  500 mg Oral Q breakfast  . methylPREDNISolone (SOLU-MEDROL) injection  40 mg Intravenous Q12H  . mometasone-formoterol  2 puff Inhalation BID  . mupirocin ointment   Nasal BID  . neomycin-bacitracin-polymyxin   Topical TID  . pantoprazole  40 mg Oral BID AC  . potassium chloride SA  40 mEq Oral Daily  . rifampin  600 mg Oral Daily  . rosuvastatin  10 mg Oral q morning - 10a  . sodium chloride flush  3 mL Intravenous Q12H  . tamsulosin  0.4 mg Oral BID  . umeclidinium-vilanterol  1 puff Inhalation Daily   Continuous: . sodium chloride     CVE:LFYBOF chloride, acetaminophen **OR** acetaminophen, chlorpheniramine-HYDROcodone, diphenhydrAMINE, levalbuterol, LORazepam, methocarbamol, nitroGLYCERIN, ondansetron **OR** ondansetron (ZOFRAN) IV, oxyCODONE-acetaminophen, polyethylene glycol, sodium chloride, sodium chloride flush, traZODone  Assesment: He was admitted with acute on chronic CHF.  He is improved but still has edema of his legs  He has acute on chronic hypoxic respiratory failure  He has COPD exacerbation and is on steroids  He has pulmonary MAC infection which complicates his situation  He has chronic pain on pain meds  He has severe anxiety which is complicated situation Active Problems:   Type 2 diabetes mellitus without complication (HCC)   COPD exacerbation (HCC)   PAF (paroxysmal atrial fibrillation) (HCC)   Chronic respiratory failure with hypoxia (HCC)   MAI (mycobacterium avium-intracellulare) (HCC)   Lower extremity edema   Vision disorder   Acute on chronic respiratory failure with hypoxia (HCC)   GERD (gastroesophageal reflux disease)   Chronic pain   Anxiety   Dyspnea   Acute exacerbation of CHF (congestive heart failure) (HCC)   Goals of care, counseling/discussion   Palliative care  by  specialist   DNR (do not resuscitate) discussion    Plan: Replace the TED hose today with probable discharge in the morning    LOS: 24 days   Alonza Bogus 05/28/2019, 9:06 AM

## 2019-05-29 ENCOUNTER — Inpatient Hospital Stay (HOSPITAL_COMMUNITY): Payer: Medicare Other

## 2019-05-29 LAB — BASIC METABOLIC PANEL
Anion gap: 13 (ref 5–15)
BUN: 20 mg/dL (ref 6–20)
CO2: 27 mmol/L (ref 22–32)
Calcium: 8.6 mg/dL — ABNORMAL LOW (ref 8.9–10.3)
Chloride: 96 mmol/L — ABNORMAL LOW (ref 98–111)
Creatinine, Ser: 1.15 mg/dL (ref 0.61–1.24)
GFR calc Af Amer: >60 mL/min (ref >60)
GFR calc non Af Amer: >60 mL/min (ref >60)
Glucose, Bld: 344 mg/dL — ABNORMAL HIGH (ref 70–99)
Potassium: 3.6 mmol/L (ref 3.5–5.1)
Sodium: 136 mmol/L (ref 135–145)

## 2019-05-29 LAB — GLUCOSE, CAPILLARY
Glucose-Capillary: 297 mg/dL — ABNORMAL HIGH (ref 70–99)
Glucose-Capillary: 151 mg/dL — ABNORMAL HIGH (ref 70–99)
Glucose-Capillary: 191 mg/dL — ABNORMAL HIGH (ref 70–99)

## 2019-05-29 MED ORDER — IOHEXOL 300 MG/ML  SOLN
100.0000 mL | Freq: Once | INTRAMUSCULAR | Status: AC | PRN
  Administered 2019-05-29: 14:00:00 100 mL via INTRAVENOUS

## 2019-05-29 MED ORDER — IOHEXOL 9 MG/ML PO SOLN
ORAL | Status: AC
  Filled 2019-05-29: qty 1000

## 2019-05-29 MED ORDER — FUROSEMIDE 40 MG PO TABS: 40.0000 mg | ORAL_TABLET | Freq: Two times a day (BID) | ORAL | 11 refills | Status: AC

## 2019-05-29 MED ORDER — INSULIN GLARGINE 100 UNIT/ML ~~LOC~~ SOLN: 60.0000 [IU] | Freq: Every day | SUBCUTANEOUS | 2 refills | Status: AC

## 2019-05-29 MED ORDER — CITALOPRAM HYDROBROMIDE 20 MG PO TABS: 20.0000 mg | ORAL_TABLET | Freq: Every day | ORAL | 12 refills | Status: AC

## 2019-05-29 NOTE — Progress Notes (Signed)
IV, telemetry removed. D/C instructions reviewed with patient, verbalized understanding. To be transported to private vehicle via wheelchair with O2.

## 2019-05-29 NOTE — Progress Notes (Signed)
He complains of right lower quadrant abdominal pain.  This has been present off and on during his hospitalization.  He wants to go home but is concerned about this pain.  I am going to plan to have him get CT abdomen and pelvis with contrast to be sure he does not have some sort of an intra-abdominal problem.  If he does not I think he could probably still be discharged

## 2019-05-30 ENCOUNTER — Other Ambulatory Visit: Payer: Self-pay

## 2019-05-30 ENCOUNTER — Other Ambulatory Visit: Payer: Self-pay | Admitting: *Deleted

## 2019-05-30 DIAGNOSIS — J441 Chronic obstructive pulmonary disease with (acute) exacerbation: Secondary | ICD-10-CM

## 2019-05-30 NOTE — Patient Outreach (Signed)
Telephone outreach for care management services was unsuccessful but I was able to leave a message and requested a return call.  [name], MSN, [facility] Gerontological Nurse Practitioner [facility] Care Management [phone]

## 2019-05-30 NOTE — Discharge Summary (Signed)
Physician Discharge Summary  Patient ID: Ryan [name] MRN: [id number] DOB/AGE: 02/03/1961 58 y.o. Primary Care Physician:Azjah Pardo, Percell Miller, MD Admit date: 05/04/2019 Discharge date: 05/30/2019    Discharge Diagnoses:   Active Problems:   Type 2 diabetes mellitus without complication (HCC)   COPD exacerbation (HCC)   PAF (paroxysmal atrial fibrillation) (HCC)   Chronic respiratory failure with hypoxia (HCC)   MAI (mycobacterium avium-intracellulare) (HCC)   Lower extremity edema   Vision disorder   Acute on chronic respiratory failure with hypoxia (HCC)   GERD (gastroesophageal reflux disease)   Chronic pain   Anxiety   Dyspnea   Acute exacerbation of CHF (congestive heart failure) (HCC)   Goals of care, counseling/discussion   Palliative care by specialist   DNR (do not resuscitate) discussion   Allergies as of 05/29/2019      Reactions   Albuterol Palpitations   Ciprofloxacin Other (See Comments)   Patient experiences vision loss from long-term and short-term use   Influenza Vaccine Live Swelling      Medication List    STOP taking these medications   doxycycline 100 MG capsule Commonly known as: VIBRAMYCIN     TAKE these medications   acetaminophen 325 MG tablet Commonly known as: TYLENOL Take 2 tablets (650 mg total) by mouth every 6 (six) hours as needed for mild pain, moderate pain, fever or headache (or Fever >/= 101).   alfuzosin 10 MG 24 hr tablet Commonly known as: UROXATRAL Take 10 mg by mouth daily with breakfast.   azithromycin 500 MG tablet Commonly known as: ZITHROMAX 1 Tab daily per Dr Ivan Anchors medication   benzonatate 200 MG capsule Commonly known as: TESSALON Take 200 mg by mouth 3 (three) times daily.   budesonide 0.25 MG/2ML nebulizer solution Commonly known as: PULMICORT Take 2 mLs (0.25 mg total) by nebulization 2 (two) times daily.   Cartia XT 120 MG 24 hr capsule Generic drug: diltiazem Take 1 capsule (120 mg total)  by mouth daily.   citalopram 20 MG tablet Commonly known as: CELEXA Take 1 tablet (20 mg total) by mouth daily.   diphenhydrAMINE 25 mg capsule Commonly known as: BENADRYL Take 25 mg by mouth every 8 (eight) hours as needed. Felt mouth swelling at ED visit 08/16/18.   fexofenadine 180 MG tablet Commonly known as: ALLEGRA Take 180 mg by mouth daily.   fluticasone 50 MCG/ACT nasal spray Commonly known as: FLONASE Place 2 sprays into both nostrils daily as needed.   furosemide 40 MG tablet Commonly known as: Lasix Take 1 tablet (40 mg total) by mouth 2 (two) times daily. As needed for swelling What changed: when to take this   insulin glargine 100 UNIT/ML injection Commonly known as: LANTUS Inject 0.6 mLs (60 Units total) into the skin daily. 24 units daily What changed: how much to take   insulin lispro 100 UNIT/ML injection Commonly known as: HumaLOG Take by sliding scale provided by hospital maximum amount daily is 60 units.  Check blood sugar 4 times a day What changed:   how much to take  how to take this  when to take this   ipratropium 0.02 % nebulizer solution Commonly known as: ATROVENT Take 0.5 mg by nebulization 2 (two) times daily.   levalbuterol 0.63 MG/3ML nebulizer solution Commonly known as: XOPENEX Take 0.63 mg by nebulization 2 (two) times daily.   LORazepam 0.5 MG tablet Commonly known as: ATIVAN Take 1 tablet (0.5 mg total) by mouth 2 (two) times daily as needed  for anxiety or sleep. What changed:   when to take this  additional instructions   magnesium 30 MG tablet Take 30 mg by mouth daily.   metFORMIN 500 MG tablet Commonly known as: GLUCOPHAGE Take 1 tablet (500 mg total) by mouth daily with breakfast.   montelukast 10 MG tablet Commonly known as: SINGULAIR Take 1 tablet by mouth daily.   nitroGLYCERIN 0.4 MG SL tablet Commonly known as: Nitrostat Place 1 tablet (0.4 mg total) under the tongue every 5 (five) minutes as needed for  chest pain.   omeprazole 20 MG capsule Commonly known as: PRILOSEC Take 1 capsule (20 mg total) by mouth 2 (two) times daily before a meal.   oxyCODONE-acetaminophen 10-325 MG tablet Commonly known as: PERCOCET Take 1 tablet by mouth every 4 (four) hours as needed for pain.   potassium chloride SA 20 MEQ tablet Commonly known as: KLOR-CON Take 1 tablet twice daily while on Lasix   predniSONE 10 MG tablet Commonly known as: DELTASONE Take 2 daily for 1 week then 1 daily What changed:   how much to take  how to take this  when to take this  additional instructions   rifampin 300 MG capsule Commonly known as: RIFADIN Take 2 capsules (600 mg total) by mouth daily. Long-term medication   rosuvastatin 10 MG tablet Commonly known as: CRESTOR Take 10 mg by mouth every morning.   Symbicort 160-4.5 MCG/ACT inhaler Generic drug: budesonide-formoterol INL 2 PFS PO BID   tamsulosin 0.4 MG Caps capsule Commonly known as: FLOMAX Take 1 capsule (0.4 mg total) by mouth 2 (two) times daily.   Trelegy Ellipta 100-62.5-25 MCG/INH Aepb Generic drug: Fluticasone-Umeclidin-Vilant Inhale 1 puff into the lungs daily.   Ventolin HFA 108 (90 Base) MCG/ACT inhaler Generic drug: albuterol Inhale 2 puffs into the lungs every 6 (six) hours as needed for wheezing or shortness of breath.       Discharged Condition: Improved    Consults: Palliative care  Significant Diagnostic Studies: Ct Abdomen Pelvis W Contrast  Result Date: 05/29/2019 CLINICAL DATA:  Right lower quadrant abdominal pain EXAM: CT ABDOMEN AND PELVIS WITH CONTRAST TECHNIQUE: Multidetector CT imaging of the abdomen and pelvis was performed using the standard protocol following bolus administration of intravenous contrast. CONTRAST:  11m OMNIPAQUE IOHEXOL 300 MG/ML SOLN, additional oral enteric contrast COMPARISON:  05/23/2019 FINDINGS: Lower chest: Severe emphysema. Scarring or atelectasis of the left lung base.  Hepatobiliary: No solid liver abnormality is seen. Hepatic steatosis. No gallstones, gallbladder wall thickening, or biliary dilatation. Pancreas: Unremarkable. No pancreatic ductal dilatation or surrounding inflammatory changes. Spleen: Normal in size without significant abnormality. Adrenals/Urinary Tract: Adrenal glands are unremarkable. Kidneys are normal, without renal calculi, solid lesion, or hydronephrosis. Bladder is unremarkable. Stomach/Bowel: Stomach is within normal limits. Appendix appears normal. No evidence of bowel wall thickening, distention, or inflammatory changes. Vascular/Lymphatic: Severe aortic atherosclerosis. No enlarged abdominal or pelvic lymph nodes. Reproductive: No mass or other significant abnormality. Other: Small umbilical hernia which contains a single, nonobstructed loop of mid small bowel (series 2, image 62). No abdominopelvic ascites. Musculoskeletal: No acute or significant osseous findings. IMPRESSION: 1. No acute CT findings of the abdomen or pelvis to explain right lower quadrant abdominal pain. Normal appendix. 2.  Hepatic steatosis. 3. Small umbilical hernia which contains a single, nonobstructed loop of mid small bowel (series 2, image 62). 4.  Aortic Atherosclerosis (ICD10-I70.0). 5.  Emphysema (ICD10-J43.9). Electronically Signed   By: AEddie CandleM.D.   On: 05/29/2019 14:22  Ct Abdomen Pelvis W Contrast  Result Date: 05/23/2019 CLINICAL DATA:  Persistent leg swelling, asymmetric, negative venous ultrasound of the lower extremities, question iliac vein thrombosis EXAM: CT ABDOMEN AND PELVIS WITH CONTRAST TECHNIQUE: Multidetector CT imaging of the abdomen and pelvis was performed using the standard protocol following bolus administration of intravenous contrast. Sagittal and coronal MPR images reconstructed from axial data set. CONTRAST:  179m OMNIPAQUE IOHEXOL 300 MG/ML SOLN IV. No oral contrast administered. COMPARISON:  09/17/2018 FINDINGS: Lower chest:  Emphysematous changes with bibasilar atelectasis greater on LEFT and central peribronchial thickening. Nodular density in lingula 7 mm diameter unchanged. Tiny LEFT pleural effusion. Hepatobiliary: Contracted gallbladder.  Liver unremarkable. Pancreas: Normal appearance Spleen: Normal appearance Adrenals/Urinary Tract: Adrenal glands, kidneys, ureters, and bladder normal appearance Stomach/Bowel: Appendix not visualized. Nonobstructed small bowel loop extends into a small umbilical hernia. Stomach and bowel loops otherwise normal appearance. Vascular/Lymphatic: Atherosclerotic calcifications aorta and iliac arteries as well as coronary arteries. Aorta normal caliber. No adenopathy. IVC, common iliac, external iliac, and visualized common femoral veins patent through common femoral vein bifurcation. No deep venous thrombosis identified. Reproductive: Unremarkable Other: No free air or free fluid. In addition to small umbilical hernia containing a nonobstructed small bowel loop, a small LEFT inguinal hernia is seen containing fat. No inflammatory process. Musculoskeletal: Unremarkable IMPRESSION: No evidence of pelvic deep venous thrombosis. Small umbilical hernia containing a nonobstructed small bowel loop. Small LEFT inguinal hernia containing fat. Stable changes of COPD and scarring at lung bases with stable 7 mm lingular nodule. No acute abnormalities. Electronically Signed   By: MLavonia DanaM.D.   On: 05/23/2019 10:13   UKoreaVenous Img Lower Unilateral Right  Result Date: 05/05/2019 CLINICAL DATA:  Swelling, pain EXAM: RIGHT LOWER EXTREMITY VENOUS DOPPLER ULTRASOUND TECHNIQUE: Gray-scale sonography with compression, as well as color and duplex ultrasound, were performed to evaluate the deep venous system from the level of the common femoral vein through the popliteal and proximal calf veins. COMPARISON:  01/07/2019 FINDINGS: Normal compressibility of the common femoral, superficial femoral, and popliteal  veins, as well as the proximal calf veins. No filling defects to suggest DVT on grayscale or color Doppler imaging. Doppler waveforms show normal direction of venous flow, normal respiratory phasicity and response to augmentation. Subcutaneous edema in the calf. Survey views of the contralateral common femoral vein are unremarkable. IMPRESSION: No femoropopliteal and no calf DVT in the visualized calf veins. If clinical symptoms are inconsistent or if there are persistent or worsening symptoms, further imaging (possibly involving the iliac veins) may be warranted. Electronically Signed   By: DLucrezia EuropeM.D.   On: 05/05/2019 15:08   Dg Chest Port 1 View  Result Date: 05/04/2019 CLINICAL DATA:  Worsening shortness of breath EXAM: PORTABLE CHEST 1 VIEW COMPARISON:  September 17, 2018 FINDINGS: The heart size and mediastinal contours are within normal limits. There is pulmonary vascular congestion. Hazy airspace opacity seen at the right lung base. Again noted is left apical pleural thickening. There is a emphysematous changes seen at the upper lungs. IMPRESSION: Pulmonary vascular congestion. Hazy airspace opacity at the right lung base which could be due to small layering effusion atelectasis and/or early infectious etiology. Electronically Signed   By: BPrudencio PairM.D.   On: 05/04/2019 02:11    Lab Results: Basic Metabolic Panel: Recent Labs    05/29/19 0952  NA 136  K 3.6  CL 96*  CO2 27  GLUCOSE 344*  BUN 20  CREATININE 1.15  CALCIUM  8.6*   Liver Function Tests: No results for input(s): AST, ALT, ALKPHOS, BILITOT, PROT, ALBUMIN in the last 72 hours.   CBC: No results for input(s): WBC, NEUTROABS, HGB, HCT, MCV, PLT in the last 72 hours.  No results found for this or any previous visit (from the past 240 hour(s)).   Hospital Course: This is a 58 year old who has severe COPD chronic diastolic heart failure pulmonary Mycobacterium avium complex infection.  He came to the emergency department  because of increasing swelling.  He was felt to have acute on chronic CHF and was started on diuresis.  He had COPD exacerbation as well.  He had acute on chronic hypoxic respiratory failure.  He was very slow to respond.  Because of his very slow response palliative care was consulted for goalsetting and he does want full measures.  He has diabetes and his blood sugar was difficult to control because of him being on steroids.  He eventually got back approximately to baseline and was ready for discharge.  He complained of abdominal pain so he had a CT abdomen that did not show any acute abnormalities.  Discharge Exam: Blood pressure 121/67, pulse (!) 105, temperature 97.9 F (36.6 C), temperature source Oral, resp. rate 20, height _0  (1.727 m), weight 84.8 kg, SpO2 92 %. He is awake and alert.  He has 1+ edema of his right leg.  Chest is fairly clear.  Heart is regular.  Disposition: Home with home health services he is very high risk for return       Signed: Alonza Bogus   05/30/2019, 8:54 AM

## 2019-05-31 ENCOUNTER — Other Ambulatory Visit: Payer: Self-pay | Admitting: *Deleted

## 2019-05-31 NOTE — Patient Outreach (Signed)
Telephone outreach #2 for care management servcies. Have left messages at home and mobile numbers. If I do not hear back from pt today, I will send an unsuccessful letter.  [name], MSN, GNP-BC Gerontological Nurse Practitioner [facility] Care Management [phone]   Did not receive a return call from Mr. Russett. I am sending him an unsuccessful out reach letter.  [name], MSN, [facility] Gerontological Nurse Practitioner [facility] Care Management [phone]

## 2019-06-01 ENCOUNTER — Encounter: Payer: Self-pay | Admitting: *Deleted

## 2019-06-01 ENCOUNTER — Other Ambulatory Visit: Payer: Self-pay | Admitting: *Deleted

## 2019-06-01 NOTE — Patient Outreach (Signed)
New pt called for intiital evaluation for care management.  Spoke with pt and wife today. Advised of my role with [facility] Care Management. I will be available for calls, educate about controlling chronic disease sxs. Will be an advocate to help provide best quality of life. Will assist to plan for end of life  Pt and wife are in agreement to participate. I will call again next week. Have advised them to call today for a hospital follow up appt. Discuss goals of care with Dr. Luan Pulling.  Patient was recently discharged from hospital and all medications have been reviewed. Outpatient Encounter Medications as of 06/01/2019  Medication Sig Note  . acetaminophen (TYLENOL) 325 MG tablet Take 2 tablets (650 mg total) by mouth every 6 (six) hours as needed for mild pain, moderate pain, fever or headache (or Fever >/= 101).   [name] alfuzosin (UROXATRAL) 10 MG 24 hr tablet Take 10 mg by mouth daily with breakfast.   . azithromycin (ZITHROMAX) 500 MG tablet 1 Tab daily per Dr Ivan Anchors medication   . benzonatate (TESSALON) 200 MG capsule Take 200 mg by mouth 3 (three) times daily.   . budesonide (PULMICORT) 0.25 MG/2ML nebulizer solution Take 2 mLs (0.25 mg total) by nebulization 2 (two) times daily.   [name] CARTIA XT 120 MG 24 hr capsule Take 1 capsule (120 mg total) by mouth daily.   . citalopram (CELEXA) 20 MG tablet Take 1 tablet (20 mg total) by mouth daily.   . diphenhydrAMINE (BENADRYL) 25 mg capsule Take 25 mg by mouth every 8 (eight) hours as needed. Felt mouth swelling at ED visit 08/16/18.   . fexofenadine (ALLEGRA) 180 MG tablet Take 180 mg by mouth daily.   . fluticasone (FLONASE) 50 MCG/ACT nasal spray Place 2 sprays into both nostrils daily as needed.    . Fluticasone-Umeclidin-Vilant (TRELEGY ELLIPTA) 100-62.5-25 MCG/INH AEPB Inhale 1 puff into the lungs daily.   . furosemide (LASIX) 40 MG tablet Take 1 tablet (40 mg total) by mouth 2 (two) times daily. As needed for swelling   . insulin  glargine (LANTUS) 100 UNIT/ML injection Inject 0.6 mLs (60 Units total) into the skin daily. 24 units daily   . insulin lispro (HUMALOG) 100 UNIT/ML injection Take by sliding scale provided by hospital maximum amount daily is 60 units.  Check blood sugar 4 times a day (Patient taking differently: Inject 0-15 Units into the skin 3 (three) times daily with meals. Take by sliding scale provided by hospital maximum amount daily is 60 units.  Check blood sugar 4 times a day)   . ipratropium (ATROVENT) 0.02 % nebulizer solution Take 0.5 mg by nebulization 2 (two) times daily.  04/28/2018: Used with the levalbuterol  . levalbuterol (XOPENEX) 0.63 MG/3ML nebulizer solution Take 0.63 mg by nebulization 2 (two) times daily. 04/28/2018: Used with the ipratropium  . LORazepam (ATIVAN) 0.5 MG tablet Take 1 tablet (0.5 mg total) by mouth 2 (two) times daily as needed for anxiety or sleep. (Patient taking differently: Take 0.5 mg by mouth 4 (four) times daily. Pt takes it three times daily.)   . magnesium 30 MG tablet Take 30 mg by mouth daily.   . metFORMIN (GLUCOPHAGE) 500 MG tablet Take 1 tablet (500 mg total) by mouth daily with breakfast.   . montelukast (SINGULAIR) 10 MG tablet Take 1 tablet by mouth daily.   . nitroGLYCERIN (NITROSTAT) 0.4 MG SL tablet Place 1 tablet (0.4 mg total) under the tongue every 5 (five) minutes as needed for  chest pain.   [name] omeprazole (PRILOSEC) 20 MG capsule Take 1 capsule (20 mg total) by mouth 2 (two) times daily before a meal.   . oxyCODONE-acetaminophen (PERCOCET) 10-325 MG tablet Take 1 tablet by mouth every 4 (four) hours as needed for pain.   . potassium chloride SA (K-DUR,KLOR-CON) 20 MEQ tablet Take 1 tablet twice daily while on Lasix   . predniSONE (DELTASONE) 10 MG tablet Take 2 daily for 1 week then 1 daily (Patient taking differently: Take 20 mg by mouth daily with breakfast. Takes 2 tablets daily.)   . rifampin (RIFADIN) 300 MG capsule Take 2 capsules (600 mg total) by  mouth daily. Long-term medication   . rosuvastatin (CRESTOR) 10 MG tablet Take 10 mg by mouth every morning.    . SYMBICORT 160-4.5 MCG/ACT inhaler INL 2 PFS PO BID   . tamsulosin (FLOMAX) 0.4 MG CAPS capsule Take 1 capsule (0.4 mg total) by mouth 2 (two) times daily.   . [DISCONTINUED] albuterol (VENTOLIN HFA) 108 (90 BASE) MCG/ACT inhaler Inhale 2 puffs into the lungs every 6 (six) hours as needed for wheezing or shortness of breath.    No facility-administered encounter medications on file as of 06/01/2019.    Fall Risk  06/01/2019 08/17/2018 12/30/2017 11/17/2017 09/03/2016  Falls in the past year? 0 0 No No No  Number falls in past yr: 0 0 - - -  Injury with Fall? 0 0 - - -  Risk for fall due to : Medication side effect;Other (Comment) Impaired vision;Medication side effect - - -  Risk for fall due to: Comment Very Frail, wears O2. - - - -  Follow up Falls evaluation completed;Education provided;Falls prevention discussed Falls evaluation completed - - -   Depression screen [facility] 2/9 04/30/2018 12/30/2017 11/17/2017 10/30/2017 09/03/2016  Decreased Interest 0 0 0 0 0  Down, Depressed, Hopeless 0 0 0 0 0  PHQ - 2 Score 0 0 0 0 0  Altered sleeping - - - - 3  Tired, decreased energy - - - - 3  Change in appetite - - - - 0  Feeling bad or failure about yourself  - - - - 0  Trouble concentrating - - - - 0  Moving slowly or fidgety/restless - - - - 0  Suicidal thoughts - - - - 0  PHQ-9 Score - - - - 6  Difficult doing work/chores - - - - Not difficult at all  Some recent data might be hidden   [facility] CM Care Plan Problem One     Most Recent Value  Care Plan Problem One  Pt needs to optimize his self management of his COPD for best quality of life over the next 31 days.  Role Documenting the Problem One  Care Management Coordinator  Care Plan for Problem One  Active  [facility] Long Term Goal   Pt will demonstrate that he has learned actions to optimize his COPD self management and quality of life by self  and wife report over the next 31 days.  [facility]N Long Term Goal Start Date  06/01/19  Interventions for Problem One Long Term Goal  Discussed need for faithful med administration, O2 use, calling MD if problem right away over the next 31 days.  [facility]N CM Short Term Goal #1   Pt will talk with NP on each scheduled call to update and discuss problems over the next 30 days.  THN CM Short Term Goal #1 Start Date  06/01/19  Interventions  for Short Term Goal #1  Educated pt and wife on care management services and advoacy for quality of life.    [facility] CM Care Plan Problem Two     Most Recent Value  Care Plan Problem Two  No Advanced Directives  Role Documenting the Problem Two  Care Management Coordinator  Care Plan for Problem Two  Active  THN CM Short Term Goal #1   Pt will discuss advanced directives with NP, wife, MD. Pt will voice his concerns and his wishes over the next 30 days.  [facility]N CM Short Term Goal #1 Start Date  06/01/19  Interventions for Short Term Goal #2   Discussed at length the importance of these documents. Discussed pt condition and disease expectations.  THN CM Short Term Goal #2   Pt will be provided the documents and will complete them by the end of 30 days.  [facility]N CM Short Term Goal #2 Start Date  06/01/19  Interventions for Short Term Goal #2  Dicussed need to have written end of life wishes in chart so his care team can know them.     [name], MSN, [facility] Gerontological Nurse Practitioner [facility] Care Management [phone]

## 2019-06-02 DIAGNOSIS — J441 Chronic obstructive pulmonary disease with (acute) exacerbation: Secondary | ICD-10-CM | POA: Diagnosis not present

## 2019-06-02 DIAGNOSIS — A310 Pulmonary mycobacterial infection: Secondary | ICD-10-CM | POA: Diagnosis not present

## 2019-06-02 DIAGNOSIS — I2510 Atherosclerotic heart disease of native coronary artery without angina pectoris: Secondary | ICD-10-CM | POA: Diagnosis not present

## 2019-06-02 DIAGNOSIS — I110 Hypertensive heart disease with heart failure: Secondary | ICD-10-CM | POA: Diagnosis not present

## 2019-06-02 DIAGNOSIS — E1165 Type 2 diabetes mellitus with hyperglycemia: Secondary | ICD-10-CM | POA: Diagnosis not present

## 2019-06-02 DIAGNOSIS — D649 Anemia, unspecified: Secondary | ICD-10-CM | POA: Diagnosis not present

## 2019-06-02 DIAGNOSIS — K219 Gastro-esophageal reflux disease without esophagitis: Secondary | ICD-10-CM | POA: Diagnosis not present

## 2019-06-02 DIAGNOSIS — I5033 Acute on chronic diastolic (congestive) heart failure: Secondary | ICD-10-CM | POA: Diagnosis not present

## 2019-06-02 DIAGNOSIS — K2270 Barrett's esophagus without dysplasia: Secondary | ICD-10-CM | POA: Diagnosis not present

## 2019-06-02 DIAGNOSIS — G8929 Other chronic pain: Secondary | ICD-10-CM | POA: Diagnosis not present

## 2019-06-02 DIAGNOSIS — J9621 Acute and chronic respiratory failure with hypoxia: Secondary | ICD-10-CM | POA: Diagnosis not present

## 2019-06-02 DIAGNOSIS — J8410 Pulmonary fibrosis, unspecified: Secondary | ICD-10-CM | POA: Diagnosis not present

## 2019-06-02 DIAGNOSIS — I480 Paroxysmal atrial fibrillation: Secondary | ICD-10-CM | POA: Diagnosis not present

## 2019-06-04 DIAGNOSIS — A310 Pulmonary mycobacterial infection: Secondary | ICD-10-CM | POA: Diagnosis not present

## 2019-06-04 DIAGNOSIS — K2270 Barrett's esophagus without dysplasia: Secondary | ICD-10-CM | POA: Diagnosis not present

## 2019-06-04 DIAGNOSIS — J441 Chronic obstructive pulmonary disease with (acute) exacerbation: Secondary | ICD-10-CM | POA: Diagnosis not present

## 2019-06-04 DIAGNOSIS — I5033 Acute on chronic diastolic (congestive) heart failure: Secondary | ICD-10-CM | POA: Diagnosis not present

## 2019-06-04 DIAGNOSIS — E1165 Type 2 diabetes mellitus with hyperglycemia: Secondary | ICD-10-CM | POA: Diagnosis not present

## 2019-06-04 DIAGNOSIS — I480 Paroxysmal atrial fibrillation: Secondary | ICD-10-CM | POA: Diagnosis not present

## 2019-06-04 DIAGNOSIS — J8410 Pulmonary fibrosis, unspecified: Secondary | ICD-10-CM | POA: Diagnosis not present

## 2019-06-04 DIAGNOSIS — J9621 Acute and chronic respiratory failure with hypoxia: Secondary | ICD-10-CM | POA: Diagnosis not present

## 2019-06-04 DIAGNOSIS — K219 Gastro-esophageal reflux disease without esophagitis: Secondary | ICD-10-CM | POA: Diagnosis not present

## 2019-06-04 DIAGNOSIS — D649 Anemia, unspecified: Secondary | ICD-10-CM | POA: Diagnosis not present

## 2019-06-04 DIAGNOSIS — I2510 Atherosclerotic heart disease of native coronary artery without angina pectoris: Secondary | ICD-10-CM | POA: Diagnosis not present

## 2019-06-04 DIAGNOSIS — I110 Hypertensive heart disease with heart failure: Secondary | ICD-10-CM | POA: Diagnosis not present

## 2019-06-04 DIAGNOSIS — G8929 Other chronic pain: Secondary | ICD-10-CM | POA: Diagnosis not present

## 2019-06-06 ENCOUNTER — Other Ambulatory Visit: Payer: Self-pay | Admitting: *Deleted

## 2019-06-06 DIAGNOSIS — I2510 Atherosclerotic heart disease of native coronary artery without angina pectoris: Secondary | ICD-10-CM | POA: Diagnosis not present

## 2019-06-06 DIAGNOSIS — K219 Gastro-esophageal reflux disease without esophagitis: Secondary | ICD-10-CM | POA: Diagnosis not present

## 2019-06-06 DIAGNOSIS — K2270 Barrett's esophagus without dysplasia: Secondary | ICD-10-CM | POA: Diagnosis not present

## 2019-06-06 DIAGNOSIS — I480 Paroxysmal atrial fibrillation: Secondary | ICD-10-CM | POA: Diagnosis not present

## 2019-06-06 DIAGNOSIS — J9621 Acute and chronic respiratory failure with hypoxia: Secondary | ICD-10-CM | POA: Diagnosis not present

## 2019-06-06 DIAGNOSIS — D649 Anemia, unspecified: Secondary | ICD-10-CM | POA: Diagnosis not present

## 2019-06-06 DIAGNOSIS — J441 Chronic obstructive pulmonary disease with (acute) exacerbation: Secondary | ICD-10-CM | POA: Diagnosis not present

## 2019-06-06 DIAGNOSIS — E1165 Type 2 diabetes mellitus with hyperglycemia: Secondary | ICD-10-CM | POA: Diagnosis not present

## 2019-06-06 DIAGNOSIS — J8410 Pulmonary fibrosis, unspecified: Secondary | ICD-10-CM | POA: Diagnosis not present

## 2019-06-06 DIAGNOSIS — I110 Hypertensive heart disease with heart failure: Secondary | ICD-10-CM | POA: Diagnosis not present

## 2019-06-06 DIAGNOSIS — A310 Pulmonary mycobacterial infection: Secondary | ICD-10-CM | POA: Diagnosis not present

## 2019-06-06 DIAGNOSIS — I5033 Acute on chronic diastolic (congestive) heart failure: Secondary | ICD-10-CM | POA: Diagnosis not present

## 2019-06-06 DIAGNOSIS — G8929 Other chronic pain: Secondary | ICD-10-CM | POA: Diagnosis not present

## 2019-06-06 NOTE — Patient Outreach (Signed)
[facility]) Care Management  06/06/2019  Ayyub Krall Rodriques [birth year] [id number]   Telephone outreach unsuccessful. Left messages on pt cell and home number, requesting a return call.  [name], MSN, GNP-BC Gerontological Nurse Practitioner [facility] Care Management [phone]  Did not receive return call. Will call pt again tomorrow.  [name], MSN, [facility] Gerontological Nurse Practitioner [facility] Care Management [phone]

## 2019-06-07 ENCOUNTER — Telehealth: Payer: Self-pay | Admitting: Pharmacist

## 2019-06-07 ENCOUNTER — Other Ambulatory Visit: Payer: Self-pay | Admitting: *Deleted

## 2019-06-07 DIAGNOSIS — A310 Pulmonary mycobacterial infection: Secondary | ICD-10-CM

## 2019-06-07 MED ORDER — AMBULATORY NON FORMULARY MEDICATION: 100.0000 mg | Freq: Every day | 11 refills | Status: DC

## 2019-06-07 NOTE — Patient Outreach (Signed)
Telephone assessment successful.  Ryan [name] was available for a discussion today. He reports he is doing pretty well. He has not had any exacerbating sxs for COPD and CHF. He was able to tell me when he needs to call the doctor.  His wife is keeping track of his appts.  He reports he has not received the Advanced Directives materials that we sent out last week at this time.  Reminded that pt can call and discuss questions or problems with me, not to hesitate.  I will call him again in one week.  [name], MSN, [facility] Gerontological Nurse Practitioner [facility] Care Management [phone]

## 2019-06-07 NOTE — Telephone Encounter (Signed)
I have 2 bottles of clofazimine in the pharmacy clinic for patient.  He should not need a refill until mid-January.

## 2019-06-10 DIAGNOSIS — G8929 Other chronic pain: Secondary | ICD-10-CM | POA: Diagnosis not present

## 2019-06-10 DIAGNOSIS — I2510 Atherosclerotic heart disease of native coronary artery without angina pectoris: Secondary | ICD-10-CM | POA: Diagnosis not present

## 2019-06-10 DIAGNOSIS — A310 Pulmonary mycobacterial infection: Secondary | ICD-10-CM | POA: Diagnosis not present

## 2019-06-10 DIAGNOSIS — K219 Gastro-esophageal reflux disease without esophagitis: Secondary | ICD-10-CM | POA: Diagnosis not present

## 2019-06-10 DIAGNOSIS — K2270 Barrett's esophagus without dysplasia: Secondary | ICD-10-CM | POA: Diagnosis not present

## 2019-06-10 DIAGNOSIS — J9621 Acute and chronic respiratory failure with hypoxia: Secondary | ICD-10-CM | POA: Diagnosis not present

## 2019-06-10 DIAGNOSIS — I480 Paroxysmal atrial fibrillation: Secondary | ICD-10-CM | POA: Diagnosis not present

## 2019-06-10 DIAGNOSIS — J441 Chronic obstructive pulmonary disease with (acute) exacerbation: Secondary | ICD-10-CM | POA: Diagnosis not present

## 2019-06-10 DIAGNOSIS — I5033 Acute on chronic diastolic (congestive) heart failure: Secondary | ICD-10-CM | POA: Diagnosis not present

## 2019-06-10 DIAGNOSIS — I110 Hypertensive heart disease with heart failure: Secondary | ICD-10-CM | POA: Diagnosis not present

## 2019-06-10 DIAGNOSIS — E1165 Type 2 diabetes mellitus with hyperglycemia: Secondary | ICD-10-CM | POA: Diagnosis not present

## 2019-06-10 DIAGNOSIS — D649 Anemia, unspecified: Secondary | ICD-10-CM | POA: Diagnosis not present

## 2019-06-10 DIAGNOSIS — J8410 Pulmonary fibrosis, unspecified: Secondary | ICD-10-CM | POA: Diagnosis not present

## 2019-06-14 ENCOUNTER — Other Ambulatory Visit: Payer: Self-pay | Admitting: *Deleted

## 2019-06-14 NOTE — Patient Outreach (Signed)
Telephone outreach.  Spoke to Ryan [name] this evening. She reports her husband is doing OK. His weight is stable, 176 today. No edema now. Occasional SOB, relieved by rescue inhaler, O2 and rest.  They have received the Advanced Directives documents and have glanced at them but not discussed them. Requested that they both review these and discuss together their wishes so that we can discuss it next week. Advised I would like to document in the chart his goals of care and complete a MOST form. Advised the other documents needs to be completed and signed with a notary and witness.  I will call again in one week.  [name], MSN, [facility] Gerontological Nurse Practitioner [facility] Care Management [phone]

## 2019-06-15 DIAGNOSIS — I5033 Acute on chronic diastolic (congestive) heart failure: Secondary | ICD-10-CM | POA: Diagnosis not present

## 2019-06-15 DIAGNOSIS — A310 Pulmonary mycobacterial infection: Secondary | ICD-10-CM | POA: Diagnosis not present

## 2019-06-15 DIAGNOSIS — J8410 Pulmonary fibrosis, unspecified: Secondary | ICD-10-CM | POA: Diagnosis not present

## 2019-06-15 DIAGNOSIS — J9621 Acute and chronic respiratory failure with hypoxia: Secondary | ICD-10-CM | POA: Diagnosis not present

## 2019-06-15 DIAGNOSIS — I110 Hypertensive heart disease with heart failure: Secondary | ICD-10-CM | POA: Diagnosis not present

## 2019-06-15 DIAGNOSIS — I2510 Atherosclerotic heart disease of native coronary artery without angina pectoris: Secondary | ICD-10-CM | POA: Diagnosis not present

## 2019-06-15 DIAGNOSIS — I480 Paroxysmal atrial fibrillation: Secondary | ICD-10-CM | POA: Diagnosis not present

## 2019-06-15 DIAGNOSIS — E1165 Type 2 diabetes mellitus with hyperglycemia: Secondary | ICD-10-CM | POA: Diagnosis not present

## 2019-06-15 DIAGNOSIS — K2270 Barrett's esophagus without dysplasia: Secondary | ICD-10-CM | POA: Diagnosis not present

## 2019-06-15 DIAGNOSIS — D649 Anemia, unspecified: Secondary | ICD-10-CM | POA: Diagnosis not present

## 2019-06-15 DIAGNOSIS — K219 Gastro-esophageal reflux disease without esophagitis: Secondary | ICD-10-CM | POA: Diagnosis not present

## 2019-06-15 DIAGNOSIS — J441 Chronic obstructive pulmonary disease with (acute) exacerbation: Secondary | ICD-10-CM | POA: Diagnosis not present

## 2019-06-15 DIAGNOSIS — G8929 Other chronic pain: Secondary | ICD-10-CM | POA: Diagnosis not present

## 2019-06-16 DIAGNOSIS — J8410 Pulmonary fibrosis, unspecified: Secondary | ICD-10-CM | POA: Diagnosis not present

## 2019-06-16 DIAGNOSIS — K219 Gastro-esophageal reflux disease without esophagitis: Secondary | ICD-10-CM | POA: Diagnosis not present

## 2019-06-16 DIAGNOSIS — G8929 Other chronic pain: Secondary | ICD-10-CM | POA: Diagnosis not present

## 2019-06-16 DIAGNOSIS — I5033 Acute on chronic diastolic (congestive) heart failure: Secondary | ICD-10-CM | POA: Diagnosis not present

## 2019-06-16 DIAGNOSIS — I480 Paroxysmal atrial fibrillation: Secondary | ICD-10-CM | POA: Diagnosis not present

## 2019-06-16 DIAGNOSIS — E1165 Type 2 diabetes mellitus with hyperglycemia: Secondary | ICD-10-CM | POA: Diagnosis not present

## 2019-06-16 DIAGNOSIS — I110 Hypertensive heart disease with heart failure: Secondary | ICD-10-CM | POA: Diagnosis not present

## 2019-06-16 DIAGNOSIS — D649 Anemia, unspecified: Secondary | ICD-10-CM | POA: Diagnosis not present

## 2019-06-16 DIAGNOSIS — J441 Chronic obstructive pulmonary disease with (acute) exacerbation: Secondary | ICD-10-CM | POA: Diagnosis not present

## 2019-06-16 DIAGNOSIS — J9621 Acute and chronic respiratory failure with hypoxia: Secondary | ICD-10-CM | POA: Diagnosis not present

## 2019-06-16 DIAGNOSIS — K2270 Barrett's esophagus without dysplasia: Secondary | ICD-10-CM | POA: Diagnosis not present

## 2019-06-16 DIAGNOSIS — A310 Pulmonary mycobacterial infection: Secondary | ICD-10-CM | POA: Diagnosis not present

## 2019-06-16 DIAGNOSIS — I2510 Atherosclerotic heart disease of native coronary artery without angina pectoris: Secondary | ICD-10-CM | POA: Diagnosis not present

## 2019-06-18 DIAGNOSIS — K2270 Barrett's esophagus without dysplasia: Secondary | ICD-10-CM | POA: Diagnosis not present

## 2019-06-18 DIAGNOSIS — I110 Hypertensive heart disease with heart failure: Secondary | ICD-10-CM | POA: Diagnosis not present

## 2019-06-18 DIAGNOSIS — J441 Chronic obstructive pulmonary disease with (acute) exacerbation: Secondary | ICD-10-CM | POA: Diagnosis not present

## 2019-06-18 DIAGNOSIS — I480 Paroxysmal atrial fibrillation: Secondary | ICD-10-CM | POA: Diagnosis not present

## 2019-06-18 DIAGNOSIS — G8929 Other chronic pain: Secondary | ICD-10-CM | POA: Diagnosis not present

## 2019-06-18 DIAGNOSIS — A310 Pulmonary mycobacterial infection: Secondary | ICD-10-CM | POA: Diagnosis not present

## 2019-06-18 DIAGNOSIS — K219 Gastro-esophageal reflux disease without esophagitis: Secondary | ICD-10-CM | POA: Diagnosis not present

## 2019-06-18 DIAGNOSIS — J9621 Acute and chronic respiratory failure with hypoxia: Secondary | ICD-10-CM | POA: Diagnosis not present

## 2019-06-18 DIAGNOSIS — I5033 Acute on chronic diastolic (congestive) heart failure: Secondary | ICD-10-CM | POA: Diagnosis not present

## 2019-06-18 DIAGNOSIS — D649 Anemia, unspecified: Secondary | ICD-10-CM | POA: Diagnosis not present

## 2019-06-18 DIAGNOSIS — J8410 Pulmonary fibrosis, unspecified: Secondary | ICD-10-CM | POA: Diagnosis not present

## 2019-06-18 DIAGNOSIS — I2510 Atherosclerotic heart disease of native coronary artery without angina pectoris: Secondary | ICD-10-CM | POA: Diagnosis not present

## 2019-06-18 DIAGNOSIS — E1165 Type 2 diabetes mellitus with hyperglycemia: Secondary | ICD-10-CM | POA: Diagnosis not present

## 2019-06-21 ENCOUNTER — Encounter: Payer: Self-pay | Admitting: *Deleted

## 2019-06-21 ENCOUNTER — Other Ambulatory Visit: Payer: Self-pay | Admitting: *Deleted

## 2019-06-21 DIAGNOSIS — R05 Cough: Secondary | ICD-10-CM | POA: Diagnosis not present

## 2019-06-21 NOTE — Patient Outreach (Signed)
Telephone outreach:  Pt is sleeping and wife talked with me today. She reports pt went into the yellow zone this week. They called his MD who prescribed an antibiotic for his COPD exacerbation. He is feeling better now.  She reports that they have talked about end of life wishes. She says he has stated that he would like all interventions initially and then if there is no hope of recovery, his wife may discontinue life support. She has given me permission to document this in the EMR.  She reports she did not get the actual HCPOA and Living Will forms. Will send those again.  Ryan [name]. Myrtie Neither, MSN, [facility] Gerontological Nurse Practitioner [facility] Care Management [phone]

## 2019-06-24 DIAGNOSIS — K219 Gastro-esophageal reflux disease without esophagitis: Secondary | ICD-10-CM | POA: Diagnosis not present

## 2019-06-24 DIAGNOSIS — I2510 Atherosclerotic heart disease of native coronary artery without angina pectoris: Secondary | ICD-10-CM | POA: Diagnosis not present

## 2019-06-24 DIAGNOSIS — J441 Chronic obstructive pulmonary disease with (acute) exacerbation: Secondary | ICD-10-CM | POA: Diagnosis not present

## 2019-06-24 DIAGNOSIS — I110 Hypertensive heart disease with heart failure: Secondary | ICD-10-CM | POA: Diagnosis not present

## 2019-06-24 DIAGNOSIS — J8410 Pulmonary fibrosis, unspecified: Secondary | ICD-10-CM | POA: Diagnosis not present

## 2019-06-24 DIAGNOSIS — I5033 Acute on chronic diastolic (congestive) heart failure: Secondary | ICD-10-CM | POA: Diagnosis not present

## 2019-06-24 DIAGNOSIS — J9621 Acute and chronic respiratory failure with hypoxia: Secondary | ICD-10-CM | POA: Diagnosis not present

## 2019-06-24 DIAGNOSIS — K2270 Barrett's esophagus without dysplasia: Secondary | ICD-10-CM | POA: Diagnosis not present

## 2019-06-24 DIAGNOSIS — D649 Anemia, unspecified: Secondary | ICD-10-CM | POA: Diagnosis not present

## 2019-06-24 DIAGNOSIS — E1165 Type 2 diabetes mellitus with hyperglycemia: Secondary | ICD-10-CM | POA: Diagnosis not present

## 2019-06-24 DIAGNOSIS — G8929 Other chronic pain: Secondary | ICD-10-CM | POA: Diagnosis not present

## 2019-06-24 DIAGNOSIS — A310 Pulmonary mycobacterial infection: Secondary | ICD-10-CM | POA: Diagnosis not present

## 2019-06-24 DIAGNOSIS — I480 Paroxysmal atrial fibrillation: Secondary | ICD-10-CM | POA: Diagnosis not present

## 2019-06-24 DIAGNOSIS — J449 Chronic obstructive pulmonary disease, unspecified: Secondary | ICD-10-CM | POA: Diagnosis not present

## 2019-06-25 DIAGNOSIS — J9621 Acute and chronic respiratory failure with hypoxia: Secondary | ICD-10-CM | POA: Diagnosis not present

## 2019-06-25 DIAGNOSIS — A310 Pulmonary mycobacterial infection: Secondary | ICD-10-CM | POA: Diagnosis not present

## 2019-06-25 DIAGNOSIS — I110 Hypertensive heart disease with heart failure: Secondary | ICD-10-CM | POA: Diagnosis not present

## 2019-06-25 DIAGNOSIS — I2510 Atherosclerotic heart disease of native coronary artery without angina pectoris: Secondary | ICD-10-CM | POA: Diagnosis not present

## 2019-06-25 DIAGNOSIS — I480 Paroxysmal atrial fibrillation: Secondary | ICD-10-CM | POA: Diagnosis not present

## 2019-06-25 DIAGNOSIS — G8929 Other chronic pain: Secondary | ICD-10-CM | POA: Diagnosis not present

## 2019-06-25 DIAGNOSIS — K2270 Barrett's esophagus without dysplasia: Secondary | ICD-10-CM | POA: Diagnosis not present

## 2019-06-25 DIAGNOSIS — J441 Chronic obstructive pulmonary disease with (acute) exacerbation: Secondary | ICD-10-CM | POA: Diagnosis not present

## 2019-06-25 DIAGNOSIS — E1165 Type 2 diabetes mellitus with hyperglycemia: Secondary | ICD-10-CM | POA: Diagnosis not present

## 2019-06-25 DIAGNOSIS — K219 Gastro-esophageal reflux disease without esophagitis: Secondary | ICD-10-CM | POA: Diagnosis not present

## 2019-06-25 DIAGNOSIS — D649 Anemia, unspecified: Secondary | ICD-10-CM | POA: Diagnosis not present

## 2019-06-25 DIAGNOSIS — I5033 Acute on chronic diastolic (congestive) heart failure: Secondary | ICD-10-CM | POA: Diagnosis not present

## 2019-06-25 DIAGNOSIS — J8410 Pulmonary fibrosis, unspecified: Secondary | ICD-10-CM | POA: Diagnosis not present

## 2019-06-28 ENCOUNTER — Other Ambulatory Visit: Payer: Self-pay | Admitting: *Deleted

## 2019-06-28 NOTE — Patient Outreach (Signed)
Telephone outreach intitial at 9:15 am unsuccessfull. Left a message and requested a return call.  [name], MSN, [facility] Gerontological Nurse Practitioner [facility] Care Management [phone]

## 2019-06-29 DIAGNOSIS — J9621 Acute and chronic respiratory failure with hypoxia: Secondary | ICD-10-CM | POA: Diagnosis not present

## 2019-06-29 DIAGNOSIS — J9611 Chronic respiratory failure with hypoxia: Secondary | ICD-10-CM | POA: Diagnosis not present

## 2019-06-29 DIAGNOSIS — I110 Hypertensive heart disease with heart failure: Secondary | ICD-10-CM | POA: Diagnosis not present

## 2019-06-29 DIAGNOSIS — J8410 Pulmonary fibrosis, unspecified: Secondary | ICD-10-CM | POA: Diagnosis not present

## 2019-06-29 DIAGNOSIS — I5033 Acute on chronic diastolic (congestive) heart failure: Secondary | ICD-10-CM | POA: Diagnosis not present

## 2019-06-29 DIAGNOSIS — J449 Chronic obstructive pulmonary disease, unspecified: Secondary | ICD-10-CM | POA: Diagnosis not present

## 2019-06-29 DIAGNOSIS — A310 Pulmonary mycobacterial infection: Secondary | ICD-10-CM | POA: Diagnosis not present

## 2019-06-29 DIAGNOSIS — I2510 Atherosclerotic heart disease of native coronary artery without angina pectoris: Secondary | ICD-10-CM | POA: Diagnosis not present

## 2019-06-29 DIAGNOSIS — K219 Gastro-esophageal reflux disease without esophagitis: Secondary | ICD-10-CM | POA: Diagnosis not present

## 2019-06-29 DIAGNOSIS — D649 Anemia, unspecified: Secondary | ICD-10-CM | POA: Diagnosis not present

## 2019-06-29 DIAGNOSIS — G8929 Other chronic pain: Secondary | ICD-10-CM | POA: Diagnosis not present

## 2019-06-29 DIAGNOSIS — K2270 Barrett's esophagus without dysplasia: Secondary | ICD-10-CM | POA: Diagnosis not present

## 2019-06-29 DIAGNOSIS — J441 Chronic obstructive pulmonary disease with (acute) exacerbation: Secondary | ICD-10-CM | POA: Diagnosis not present

## 2019-06-29 DIAGNOSIS — E1165 Type 2 diabetes mellitus with hyperglycemia: Secondary | ICD-10-CM | POA: Diagnosis not present

## 2019-06-29 DIAGNOSIS — I480 Paroxysmal atrial fibrillation: Secondary | ICD-10-CM | POA: Diagnosis not present

## 2019-06-29 NOTE — Patient Outreach (Signed)
Telephone outreach for chronic disease management.  No answer, left a message for a return call.  Ryan [name]. Ryan Neither, MSN, [facility] Gerontological Nurse Practitioner [facility] Care Management [phone]

## 2019-07-01 ENCOUNTER — Encounter: Payer: Self-pay | Admitting: *Deleted

## 2019-07-04 ENCOUNTER — Other Ambulatory Visit: Payer: Self-pay | Admitting: Internal Medicine

## 2019-07-04 ENCOUNTER — Encounter: Payer: Self-pay | Admitting: *Deleted

## 2019-07-04 NOTE — Patient Outreach (Signed)
Called but unable to contact pt or his wife. Left a message and asked for a return call.  [name], MSN, [facility] Gerontological Nurse Practitioner [facility] Care Management [phone]

## 2019-07-05 DIAGNOSIS — I5033 Acute on chronic diastolic (congestive) heart failure: Secondary | ICD-10-CM | POA: Diagnosis not present

## 2019-07-05 DIAGNOSIS — I2510 Atherosclerotic heart disease of native coronary artery without angina pectoris: Secondary | ICD-10-CM | POA: Diagnosis not present

## 2019-07-05 DIAGNOSIS — J441 Chronic obstructive pulmonary disease with (acute) exacerbation: Secondary | ICD-10-CM | POA: Diagnosis not present

## 2019-07-05 DIAGNOSIS — G8929 Other chronic pain: Secondary | ICD-10-CM | POA: Diagnosis not present

## 2019-07-05 DIAGNOSIS — K2270 Barrett's esophagus without dysplasia: Secondary | ICD-10-CM | POA: Diagnosis not present

## 2019-07-05 DIAGNOSIS — K219 Gastro-esophageal reflux disease without esophagitis: Secondary | ICD-10-CM | POA: Diagnosis not present

## 2019-07-05 DIAGNOSIS — J8410 Pulmonary fibrosis, unspecified: Secondary | ICD-10-CM | POA: Diagnosis not present

## 2019-07-05 DIAGNOSIS — D649 Anemia, unspecified: Secondary | ICD-10-CM | POA: Diagnosis not present

## 2019-07-05 DIAGNOSIS — J9621 Acute and chronic respiratory failure with hypoxia: Secondary | ICD-10-CM | POA: Diagnosis not present

## 2019-07-05 DIAGNOSIS — I110 Hypertensive heart disease with heart failure: Secondary | ICD-10-CM | POA: Diagnosis not present

## 2019-07-05 DIAGNOSIS — A310 Pulmonary mycobacterial infection: Secondary | ICD-10-CM | POA: Diagnosis not present

## 2019-07-05 DIAGNOSIS — I480 Paroxysmal atrial fibrillation: Secondary | ICD-10-CM | POA: Diagnosis not present

## 2019-07-05 DIAGNOSIS — E1165 Type 2 diabetes mellitus with hyperglycemia: Secondary | ICD-10-CM | POA: Diagnosis not present

## 2019-07-06 ENCOUNTER — Other Ambulatory Visit: Payer: Self-pay

## 2019-07-06 ENCOUNTER — Telehealth: Payer: Self-pay

## 2019-07-06 MED ORDER — AZITHROMYCIN 500 MG PO TABS: ORAL_TABLET | ORAL | 0 refills | Status: DC

## 2019-07-06 NOTE — Telephone Encounter (Signed)
Yes, ok to refill and follow up evisit in Jan. thanks

## 2019-07-06 NOTE — Telephone Encounter (Signed)
Patient's wife called office this morning requesting office fill Azithromycin. States that Dr.Hawkins has retired and did not renew prescription before leaving office. Has an appointment to establish primary care this upcoming Monday. Would also like to know if patient could reschedule missed appointment as an evisit. Will forward message to MD. Aundria Rud

## 2019-07-07 IMAGING — DX DG CHEST 2V
2 series · 2 of 2 positions shown · non-contrast
Comparison: November 27, 2017

CLINICAL DATA: Shortness of breath

EXAM:
CHEST - 2 VIEW

[chest pa]
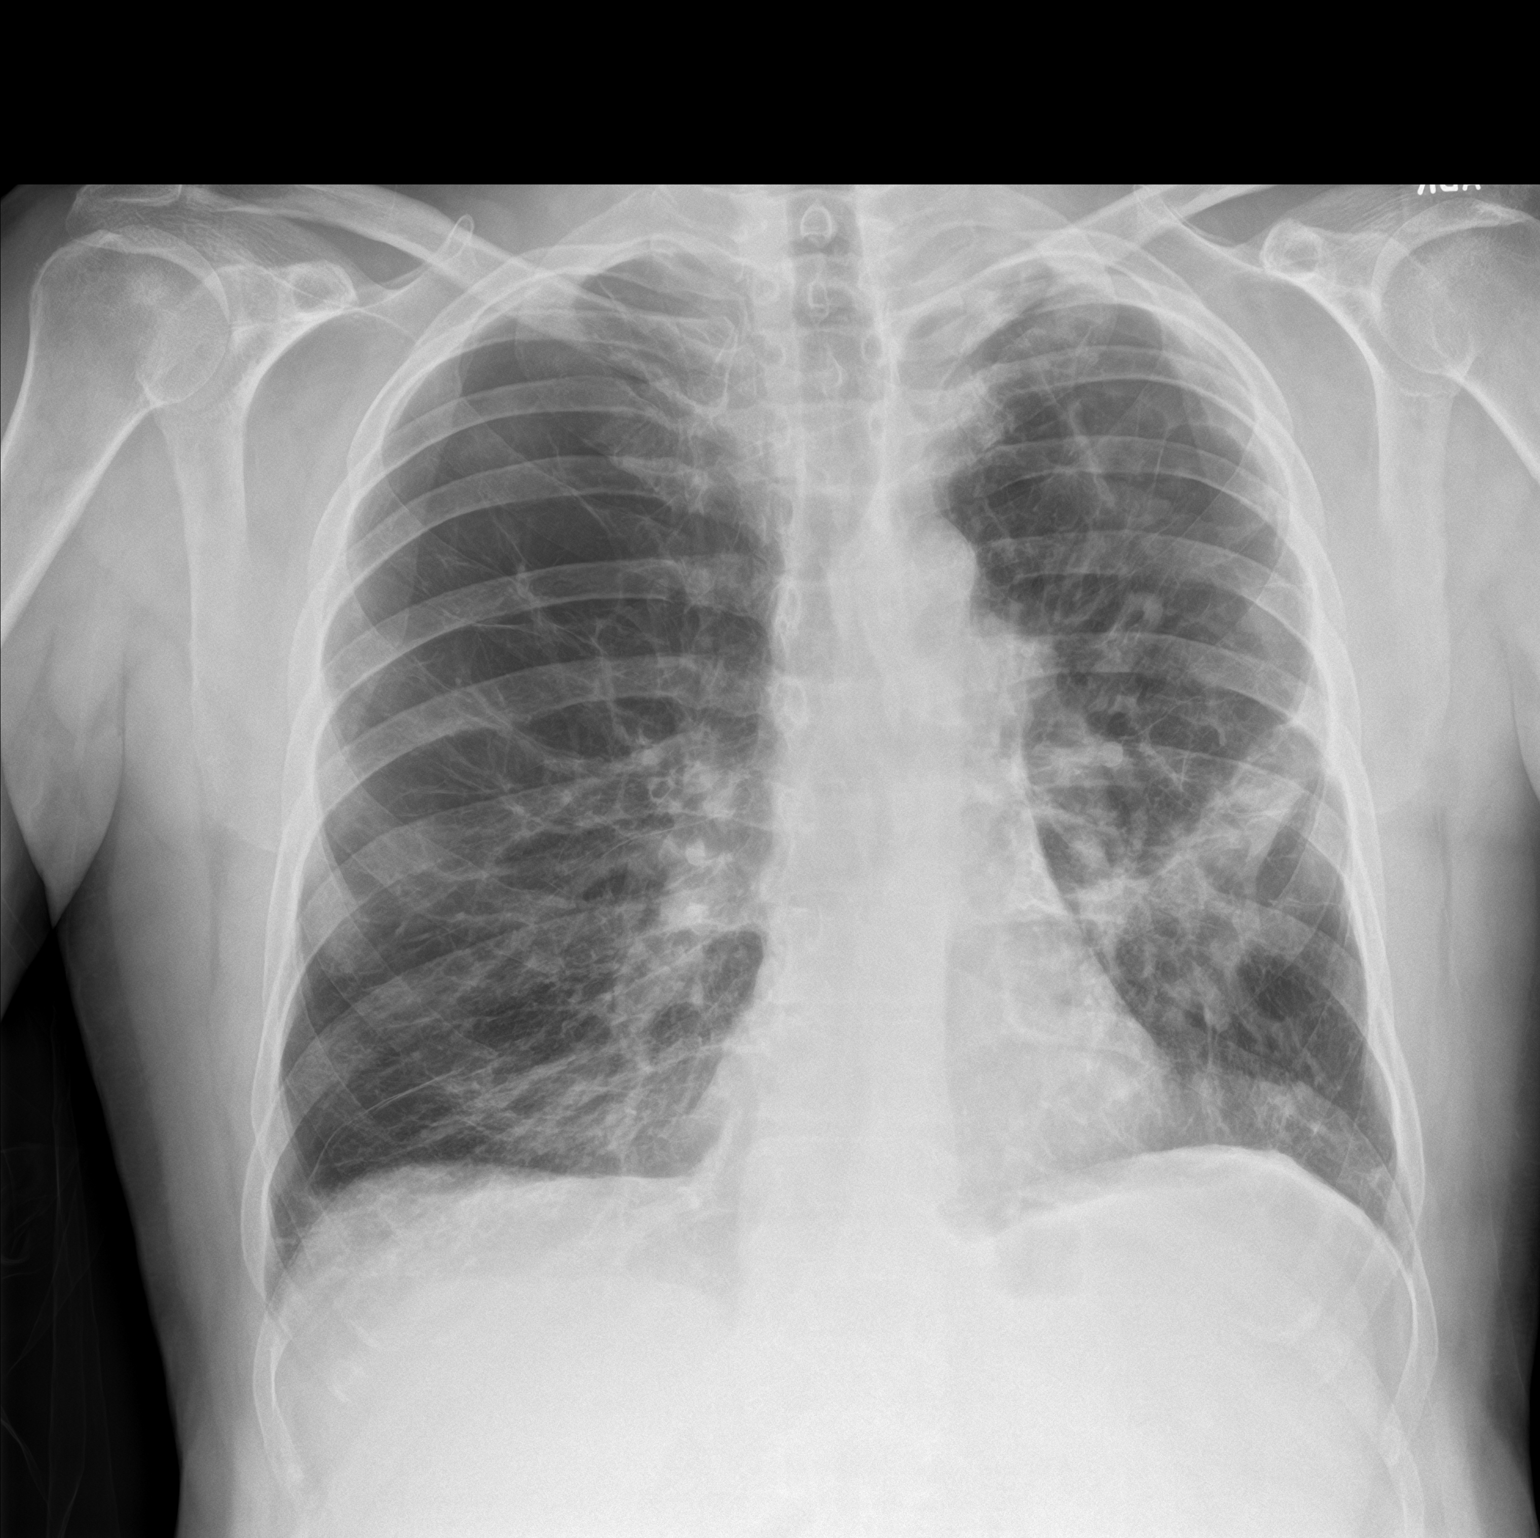

[chest lat]
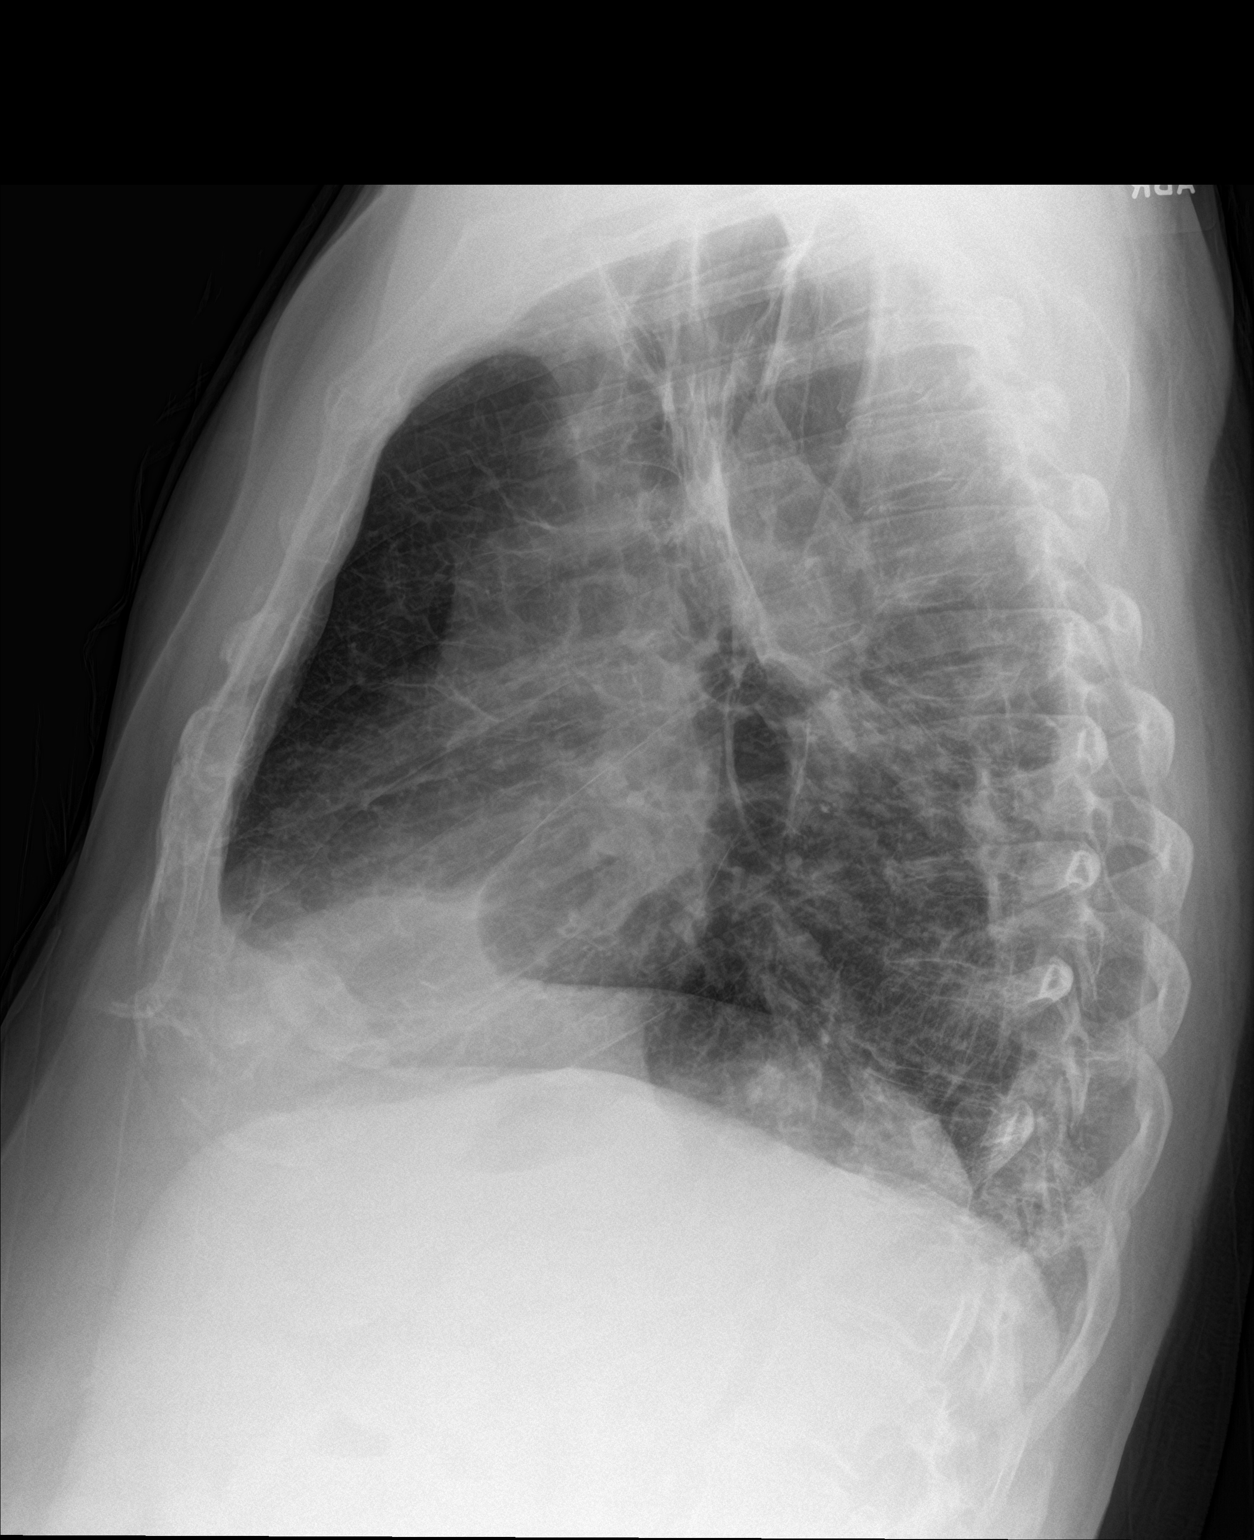

[2 of 2 positions shown; findings below may reference images not displayed]

FINDINGS: There are multiple bullae, most notably in the right upper lobe.
There are multiple areas of scarring. There is no edema or
consolidation. The heart size is normal. Pulmonary vascularity
reflects the underlying bullous disease in the upper lobes. No
adenopathy. There is evidence of an old fracture of the anterior
left fifth rib.
IMPRESSION: Bullous emphysematous change with areas of scarring. No edema or
consolidation. Stable cardiac silhouette. No evident adenopathy.

Emphysema ([id number]).

## 2019-07-10 IMAGING — CT CT ABD-PELV W/ CM
2 of 5 series · 17 of 46 positions shown, 19 images · IV contrast (Isovue)
Comparison: 12/09/2017 and prior CTs

CLINICAL DATA: 57-year-old male with acute abdominal pain today.

EXAM:
CT ABDOMEN AND PELVIS WITH CONTRAST
TECHNIQUE: Multidetector CT imaging of the abdomen and pelvis was performed
using the standard protocol following bolus administration of
intravenous contrast.
CONTRAST:  100mL DG0SB5-ZMM IOPAMIDOL (DG0SB5-ZMM) INJECTION 61%

[Series 2: axial st · axial · 0.75mm/px · z∈[+902,+1317]mm · 14 of 93 slices shown, 16 images]
[im 5/93  soft-tissue]
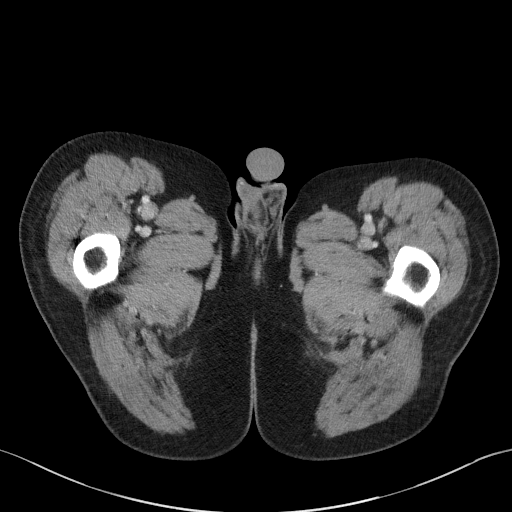
[im 5/93  bone]
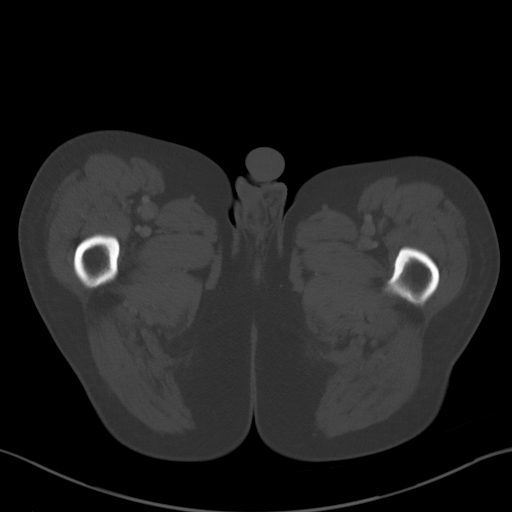
[im 10/93  soft-tissue]
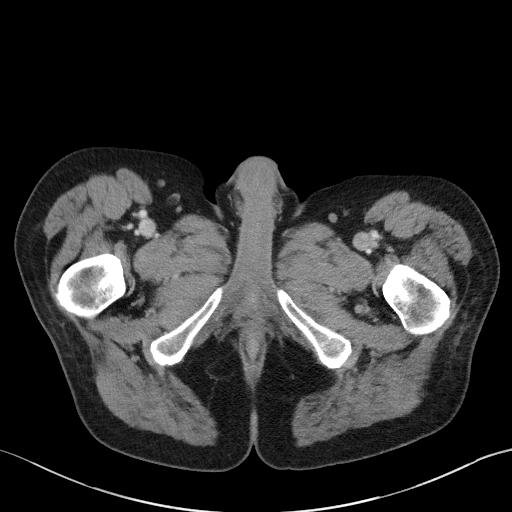
[im 20/93  soft-tissue]
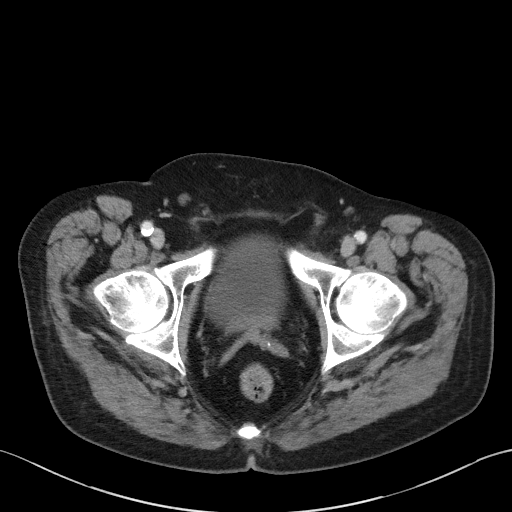
[im 25/93  soft-tissue]
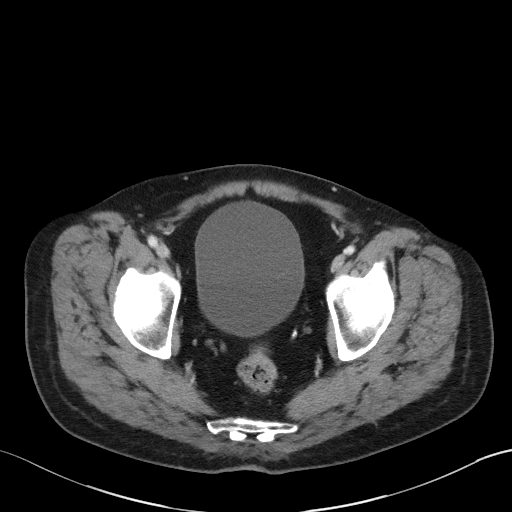
[im 30/93  soft-tissue]
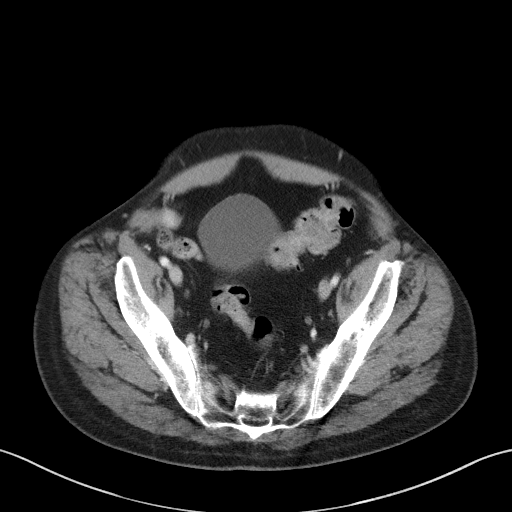
[im 39/93  soft-tissue]
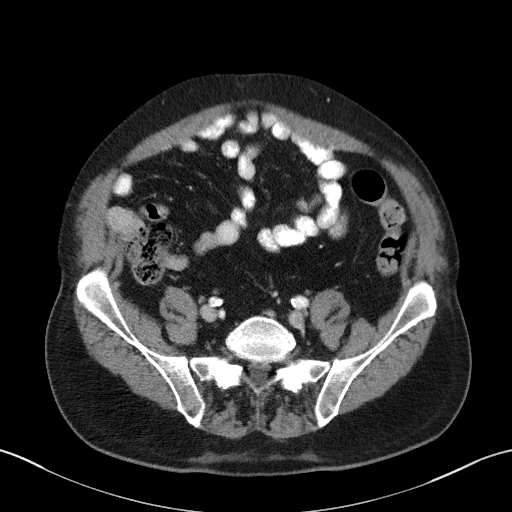
[im 44/93  soft-tissue]
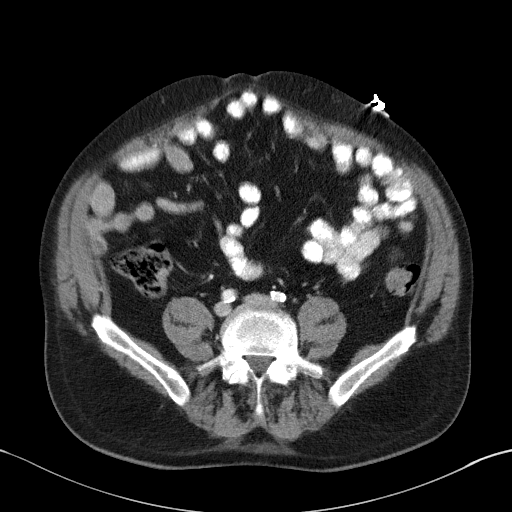
[im 49/93  soft-tissue]
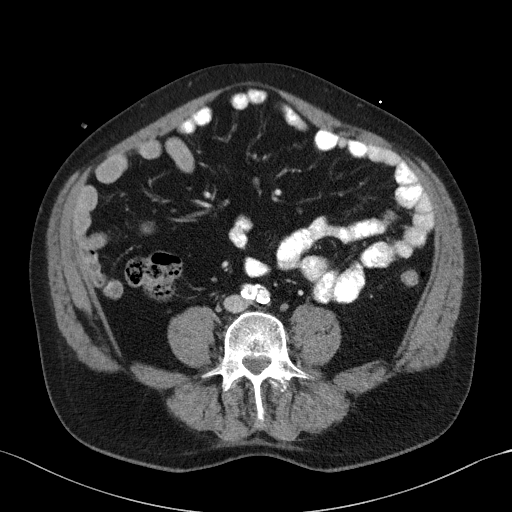
[im 54/93  soft-tissue]
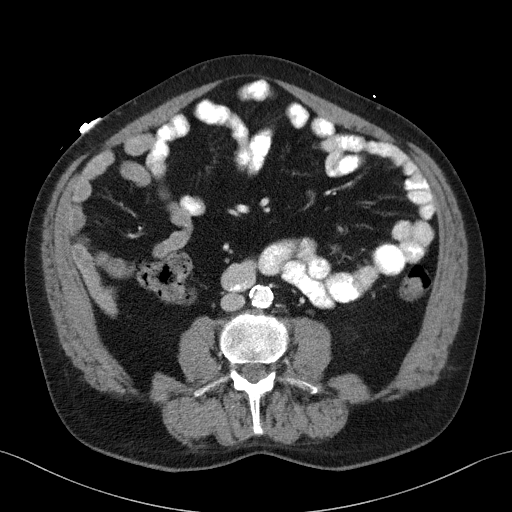
[im 54/93  bone]
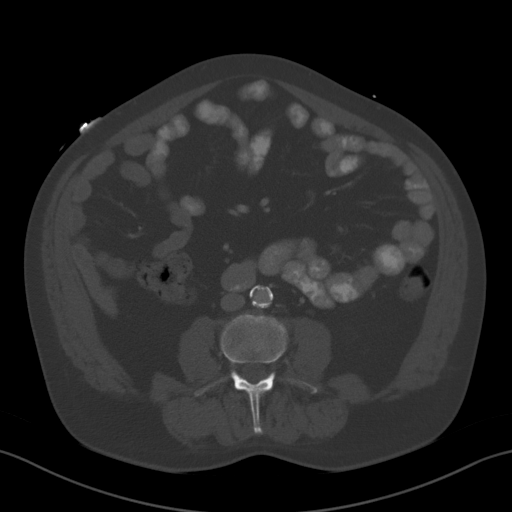
[im 63/93  soft-tissue]
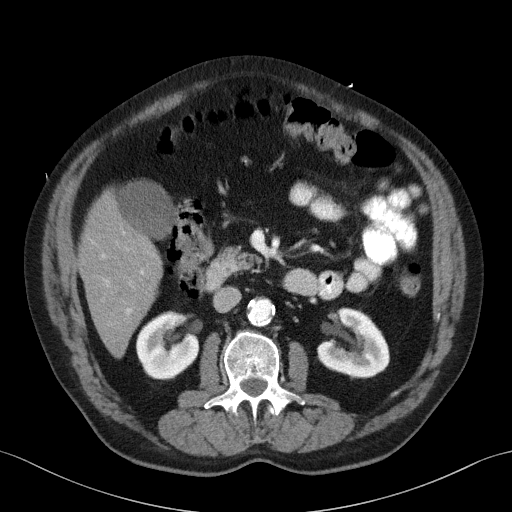
[im 68/93  soft-tissue]
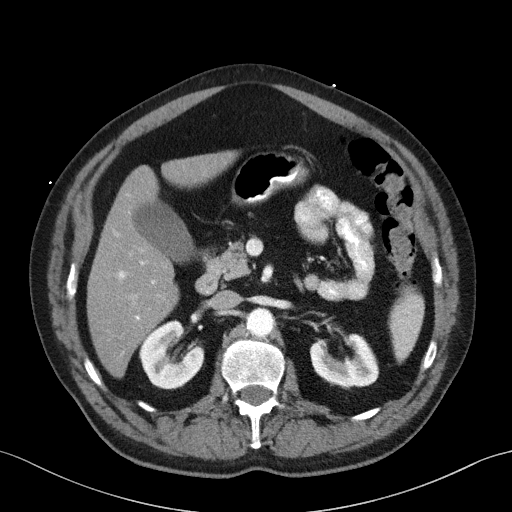
[im 73/93  soft-tissue]
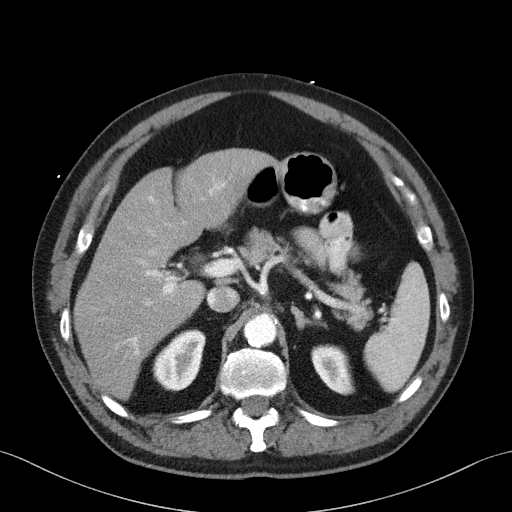
[im 83/93  soft-tissue]
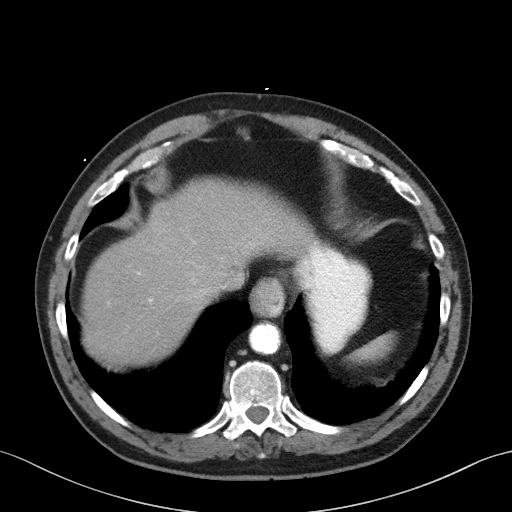
[im 88/93  soft-tissue]
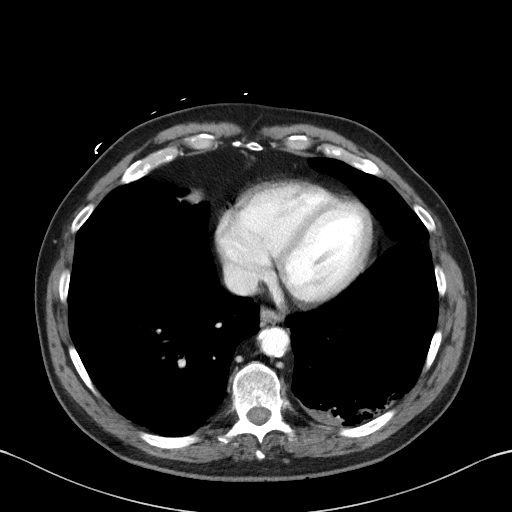

[Series 6: coronal st · coronal · 0.81mm/px · 3 of 115 slices shown]
[im 39/115  soft-tissue]
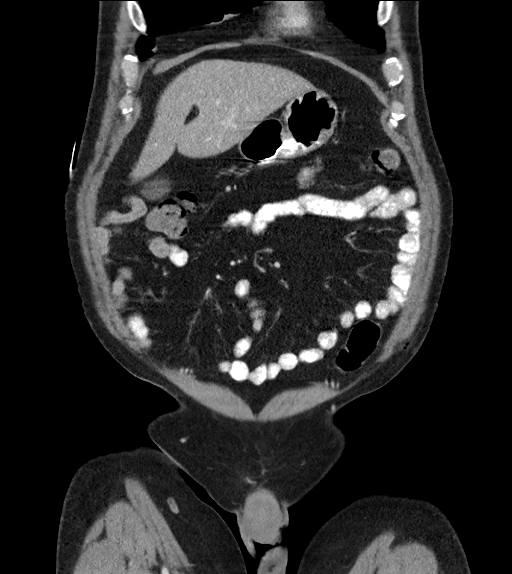
[im 51/115  soft-tissue]
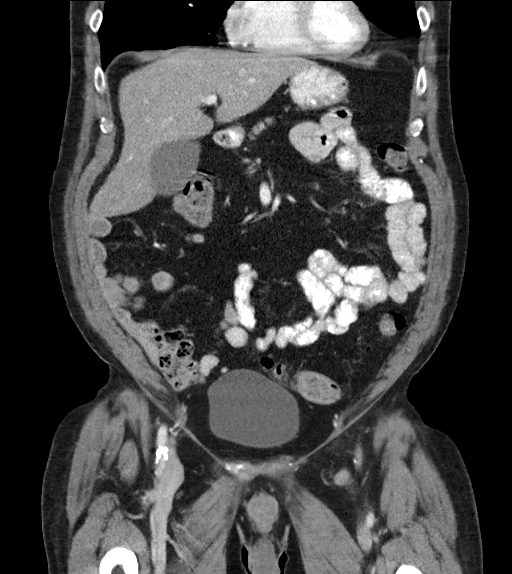
[im 64/115  soft-tissue]
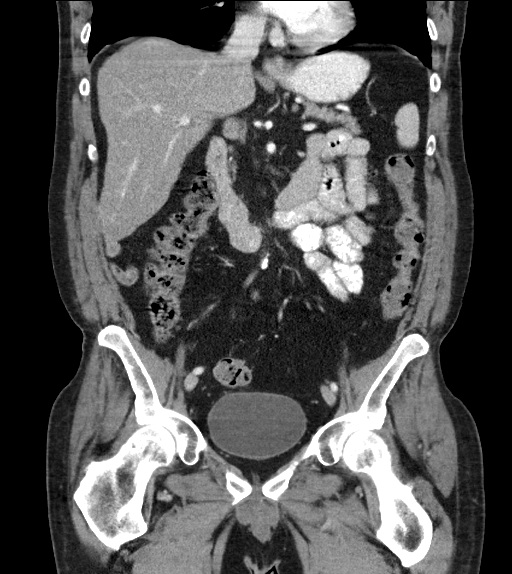

[17 of 46 positions shown; findings below may reference images not displayed]

FINDINGS: Lower chest: Emphysema and mild LEFT basilar scarring again noted.

Hepatobiliary: Mild hepatic steatosis noted. No focal hepatic
abnormalities are present. The gallbladder is unremarkable. No
biliary dilatation.

Pancreas: Unremarkable

Spleen: Unremarkable

Adrenals/Urinary Tract: The kidneys, adrenal glands and bladder are
unremarkable.

Stomach/Bowel: Stomach is within normal limits except for small
hiatal hernia.. Appendix appears normal. No evidence of bowel wall
thickening, distention, or inflammatory changes.

Vascular/Lymphatic: Aortic atherosclerosis. No enlarged abdominal or
pelvic lymph nodes.

Reproductive: Prostate is unremarkable.

Other: No free fluid, focal collection or pneumoperitoneum. LEFT
inguinal hernia surgical changes noted.

Musculoskeletal: No acute or suspicious bony abnormalities.
IMPRESSION: 1. No evidence of acute abnormality or CT findings to suggest a
cause for this patient's abdominal pain.
2. Mild hepatic steatosis number
3. Small hiatal hernia
4. Aortic Atherosclerosis ([id number].B) and Emphysema ([id number]).

## 2019-07-12 DIAGNOSIS — J441 Chronic obstructive pulmonary disease with (acute) exacerbation: Secondary | ICD-10-CM | POA: Diagnosis not present

## 2019-07-12 DIAGNOSIS — G8929 Other chronic pain: Secondary | ICD-10-CM | POA: Diagnosis not present

## 2019-07-12 DIAGNOSIS — D649 Anemia, unspecified: Secondary | ICD-10-CM | POA: Diagnosis not present

## 2019-07-12 DIAGNOSIS — A310 Pulmonary mycobacterial infection: Secondary | ICD-10-CM | POA: Diagnosis not present

## 2019-07-12 DIAGNOSIS — K2270 Barrett's esophagus without dysplasia: Secondary | ICD-10-CM | POA: Diagnosis not present

## 2019-07-12 DIAGNOSIS — K219 Gastro-esophageal reflux disease without esophagitis: Secondary | ICD-10-CM | POA: Diagnosis not present

## 2019-07-12 DIAGNOSIS — I480 Paroxysmal atrial fibrillation: Secondary | ICD-10-CM | POA: Diagnosis not present

## 2019-07-12 DIAGNOSIS — I2510 Atherosclerotic heart disease of native coronary artery without angina pectoris: Secondary | ICD-10-CM | POA: Diagnosis not present

## 2019-07-12 DIAGNOSIS — J9621 Acute and chronic respiratory failure with hypoxia: Secondary | ICD-10-CM | POA: Diagnosis not present

## 2019-07-12 DIAGNOSIS — J8410 Pulmonary fibrosis, unspecified: Secondary | ICD-10-CM | POA: Diagnosis not present

## 2019-07-12 DIAGNOSIS — E1165 Type 2 diabetes mellitus with hyperglycemia: Secondary | ICD-10-CM | POA: Diagnosis not present

## 2019-07-12 DIAGNOSIS — I5033 Acute on chronic diastolic (congestive) heart failure: Secondary | ICD-10-CM | POA: Diagnosis not present

## 2019-07-12 DIAGNOSIS — I110 Hypertensive heart disease with heart failure: Secondary | ICD-10-CM | POA: Diagnosis not present

## 2019-07-19 ENCOUNTER — Other Ambulatory Visit: Payer: Self-pay

## 2019-07-19 ENCOUNTER — Telehealth: Payer: Self-pay

## 2019-07-19 ENCOUNTER — Other Ambulatory Visit: Payer: Self-pay | Admitting: *Deleted

## 2019-07-19 ENCOUNTER — Encounter: Payer: Self-pay | Admitting: Internal Medicine

## 2019-07-19 ENCOUNTER — Ambulatory Visit (INDEPENDENT_AMBULATORY_CARE_PROVIDER_SITE_OTHER): Payer: Medicare Other | Admitting: Internal Medicine

## 2019-07-19 DIAGNOSIS — A310 Pulmonary mycobacterial infection: Secondary | ICD-10-CM

## 2019-07-19 MED ORDER — AZITHROMYCIN 500 MG PO TABS: ORAL_TABLET | ORAL | 11 refills | Status: DC

## 2019-07-19 MED ORDER — RIFAMPIN 300 MG PO CAPS: 600.0000 mg | ORAL_CAPSULE | Freq: Every day | ORAL | 11 refills | Status: DC

## 2019-07-19 NOTE — Telephone Encounter (Signed)
Spoke with Ms. Spinks about patient's care. Will contact patient's wife to coordinate care and testing.   Dorethia Jeanmarie Lorita Officer, RN

## 2019-07-19 NOTE — Telephone Encounter (Signed)
[place] to find out if patient is under their care and coordinate a sputum sample collection with his nurse visits. Was provided patient's care coordinator phone # Arbie Cookey Spinx). Left HIPAA compliant voicemail requesting call back to discuss options for sample collection.   Katy Brickell Lorita Officer, RN

## 2019-07-19 NOTE — Progress Notes (Signed)
   Subjective:    Patient ID: Ryan [name], male    DOB: March 26, 1961, 59 y.o.   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   I connected with  Ryan [name] on 07/19/19 by phone and verified that I am speaking with the correct person using two identifiers.   I discussed the limitations of evaluation and management by telemedicine. The patient expressed understanding and agreed to proceed.  I saw him in May and then June 2019 by referral from Dr. Luan Pulling for treatment recommendations with his history of potential eye issues with cipro.  I started him on rifampin, ethambutol and azithromycin.  He started to have some eye issues again and went to his ophthalmologist who felt it was related to the ethambutol, so this was stopped.  He has continued on two drug daily therapy for his cavitary lesion. He did get a sputum after his last visit and it has remained negative for MAI in March of 2020 and I then added clofazamine to his regimen in July of 2020.  He is tolerating all well.  He though continues to have significant cough and sob and continues on 6L O2 by nasal cannula continuously.  Has been on intermittent Levaquin as well.  Needs refills of the azithromycin. Is establishing with a new PCP since Dr. Luan Pulling retired.  No fever.      Review of Systems  Constitutional: Negative for fever.  Gastrointestinal: Negative for diarrhea and nausea.  Skin: Negative for rash.       Objective:   Physical Exam Pulmonary:     Comments: Speaking in full sentences over phone          Assessment & Plan:

## 2019-07-19 NOTE — Telephone Encounter (Signed)
Contacted Ms. Brem to inform her that orders have been placed for sputum collection and that she can retrieve a collection cup from [place] lab. She verbalized understanding and stated she would go tomorrow or the day after.   Kuper Rennels Lorita Officer, RN

## 2019-07-19 NOTE — Assessment & Plan Note (Signed)
He has been on treatment now over 1 year and on clofazamine about 5 months.  I will have him continue with the 3 drug regimen  I will repeat the sputum again, last check in march was negative for MAI on smear and culture.   Will plan another 6 months or so of treatment.  Most of his issues at this point are likely related to his end stage lung disease rather than infection.

## 2019-07-20 ENCOUNTER — Other Ambulatory Visit: Payer: Self-pay | Admitting: *Deleted

## 2019-07-20 ENCOUNTER — Encounter: Payer: Self-pay | Admitting: *Deleted

## 2019-07-20 DIAGNOSIS — K219 Gastro-esophageal reflux disease without esophagitis: Secondary | ICD-10-CM | POA: Diagnosis not present

## 2019-07-20 DIAGNOSIS — K2270 Barrett's esophagus without dysplasia: Secondary | ICD-10-CM | POA: Diagnosis not present

## 2019-07-20 DIAGNOSIS — I480 Paroxysmal atrial fibrillation: Secondary | ICD-10-CM | POA: Diagnosis not present

## 2019-07-20 DIAGNOSIS — I5033 Acute on chronic diastolic (congestive) heart failure: Secondary | ICD-10-CM | POA: Diagnosis not present

## 2019-07-20 DIAGNOSIS — J8410 Pulmonary fibrosis, unspecified: Secondary | ICD-10-CM | POA: Diagnosis not present

## 2019-07-20 DIAGNOSIS — I2510 Atherosclerotic heart disease of native coronary artery without angina pectoris: Secondary | ICD-10-CM | POA: Diagnosis not present

## 2019-07-20 DIAGNOSIS — E1165 Type 2 diabetes mellitus with hyperglycemia: Secondary | ICD-10-CM | POA: Diagnosis not present

## 2019-07-20 DIAGNOSIS — G8929 Other chronic pain: Secondary | ICD-10-CM | POA: Diagnosis not present

## 2019-07-20 DIAGNOSIS — A310 Pulmonary mycobacterial infection: Secondary | ICD-10-CM | POA: Diagnosis not present

## 2019-07-20 DIAGNOSIS — D649 Anemia, unspecified: Secondary | ICD-10-CM | POA: Diagnosis not present

## 2019-07-20 DIAGNOSIS — I110 Hypertensive heart disease with heart failure: Secondary | ICD-10-CM | POA: Diagnosis not present

## 2019-07-20 DIAGNOSIS — J9621 Acute and chronic respiratory failure with hypoxia: Secondary | ICD-10-CM | POA: Diagnosis not present

## 2019-07-20 DIAGNOSIS — J441 Chronic obstructive pulmonary disease with (acute) exacerbation: Secondary | ICD-10-CM | POA: Diagnosis not present

## 2019-07-20 NOTE — Patient Outreach (Signed)
Case closed due to inability to maintain contact with pt after several calls and no answer to letter sent. Last call placed to pt to advise of case closure made. Encouraged participation or calls are welcome at any time in the future.  Ryan [name]. Myrtie Neither, MSN, [facility] Gerontological Nurse Practitioner [facility] Care Management [phone]

## 2019-07-25 DIAGNOSIS — J449 Chronic obstructive pulmonary disease, unspecified: Secondary | ICD-10-CM | POA: Diagnosis not present

## 2019-07-27 DIAGNOSIS — E559 Vitamin D deficiency, unspecified: Secondary | ICD-10-CM | POA: Diagnosis not present

## 2019-07-27 DIAGNOSIS — R5383 Other fatigue: Secondary | ICD-10-CM | POA: Diagnosis not present

## 2019-07-27 DIAGNOSIS — Z79899 Other long term (current) drug therapy: Secondary | ICD-10-CM | POA: Diagnosis not present

## 2019-07-27 DIAGNOSIS — M545 Low back pain: Secondary | ICD-10-CM | POA: Diagnosis not present

## 2019-07-27 DIAGNOSIS — R209 Unspecified disturbances of skin sensation: Secondary | ICD-10-CM | POA: Diagnosis not present

## 2019-07-28 DIAGNOSIS — J441 Chronic obstructive pulmonary disease with (acute) exacerbation: Secondary | ICD-10-CM | POA: Diagnosis not present

## 2019-07-28 DIAGNOSIS — K219 Gastro-esophageal reflux disease without esophagitis: Secondary | ICD-10-CM | POA: Diagnosis not present

## 2019-07-28 DIAGNOSIS — J9621 Acute and chronic respiratory failure with hypoxia: Secondary | ICD-10-CM | POA: Diagnosis not present

## 2019-07-28 DIAGNOSIS — G8929 Other chronic pain: Secondary | ICD-10-CM | POA: Diagnosis not present

## 2019-07-28 DIAGNOSIS — I480 Paroxysmal atrial fibrillation: Secondary | ICD-10-CM | POA: Diagnosis not present

## 2019-07-28 DIAGNOSIS — D649 Anemia, unspecified: Secondary | ICD-10-CM | POA: Diagnosis not present

## 2019-07-28 DIAGNOSIS — E1165 Type 2 diabetes mellitus with hyperglycemia: Secondary | ICD-10-CM | POA: Diagnosis not present

## 2019-07-28 DIAGNOSIS — I110 Hypertensive heart disease with heart failure: Secondary | ICD-10-CM | POA: Diagnosis not present

## 2019-07-28 DIAGNOSIS — I5033 Acute on chronic diastolic (congestive) heart failure: Secondary | ICD-10-CM | POA: Diagnosis not present

## 2019-07-28 DIAGNOSIS — I2510 Atherosclerotic heart disease of native coronary artery without angina pectoris: Secondary | ICD-10-CM | POA: Diagnosis not present

## 2019-07-28 DIAGNOSIS — A310 Pulmonary mycobacterial infection: Secondary | ICD-10-CM | POA: Diagnosis not present

## 2019-07-28 DIAGNOSIS — J8410 Pulmonary fibrosis, unspecified: Secondary | ICD-10-CM | POA: Diagnosis not present

## 2019-07-28 DIAGNOSIS — K2270 Barrett's esophagus without dysplasia: Secondary | ICD-10-CM | POA: Diagnosis not present

## 2019-08-08 ENCOUNTER — Inpatient Hospital Stay (HOSPITAL_COMMUNITY): Payer: Medicare Other

## 2019-08-08 ENCOUNTER — Inpatient Hospital Stay (HOSPITAL_COMMUNITY)
Admission: EM | Admit: 2019-08-08 | Discharge: 2019-08-14 | DRG: 177 | Disposition: A | Discharge status: Home / Self Care | Payer: Medicare Other | Attending: Family Medicine | Admitting: Family Medicine

## 2019-08-08 ENCOUNTER — Encounter (HOSPITAL_COMMUNITY): Payer: Self-pay | Admitting: *Deleted

## 2019-08-08 ENCOUNTER — Other Ambulatory Visit: Payer: Self-pay

## 2019-08-08 ENCOUNTER — Emergency Department (HOSPITAL_COMMUNITY): Payer: Medicare Other

## 2019-08-08 DIAGNOSIS — Z7189 Other specified counseling: Secondary | ICD-10-CM | POA: Diagnosis not present

## 2019-08-08 DIAGNOSIS — E119 Type 2 diabetes mellitus without complications: Secondary | ICD-10-CM

## 2019-08-08 DIAGNOSIS — Z833 Family history of diabetes mellitus: Secondary | ICD-10-CM

## 2019-08-08 DIAGNOSIS — E1165 Type 2 diabetes mellitus with hyperglycemia: Secondary | ICD-10-CM | POA: Diagnosis not present

## 2019-08-08 DIAGNOSIS — J441 Chronic obstructive pulmonary disease with (acute) exacerbation: Secondary | ICD-10-CM | POA: Diagnosis not present

## 2019-08-08 DIAGNOSIS — F410 Panic disorder [episodic paroxysmal anxiety] without agoraphobia: Secondary | ICD-10-CM | POA: Diagnosis present

## 2019-08-08 DIAGNOSIS — I5032 Chronic diastolic (congestive) heart failure: Secondary | ICD-10-CM | POA: Diagnosis not present

## 2019-08-08 DIAGNOSIS — I10 Essential (primary) hypertension: Secondary | ICD-10-CM | POA: Diagnosis not present

## 2019-08-08 DIAGNOSIS — Z515 Encounter for palliative care: Secondary | ICD-10-CM | POA: Diagnosis not present

## 2019-08-08 DIAGNOSIS — J9620 Acute and chronic respiratory failure, unspecified whether with hypoxia or hypercapnia: Secondary | ICD-10-CM | POA: Diagnosis present

## 2019-08-08 DIAGNOSIS — I700 Atherosclerosis of aorta: Secondary | ICD-10-CM | POA: Diagnosis present

## 2019-08-08 DIAGNOSIS — J449 Chronic obstructive pulmonary disease, unspecified: Secondary | ICD-10-CM | POA: Diagnosis present

## 2019-08-08 DIAGNOSIS — N400 Enlarged prostate without lower urinary tract symptoms: Secondary | ICD-10-CM | POA: Diagnosis present

## 2019-08-08 DIAGNOSIS — B9689 Other specified bacterial agents as the cause of diseases classified elsewhere: Secondary | ICD-10-CM | POA: Diagnosis not present

## 2019-08-08 DIAGNOSIS — J9622 Acute and chronic respiratory failure with hypercapnia: Secondary | ICD-10-CM

## 2019-08-08 DIAGNOSIS — R0902 Hypoxemia: Secondary | ICD-10-CM | POA: Diagnosis not present

## 2019-08-08 DIAGNOSIS — B371 Pulmonary candidiasis: Secondary | ICD-10-CM | POA: Diagnosis not present

## 2019-08-08 DIAGNOSIS — E785 Hyperlipidemia, unspecified: Secondary | ICD-10-CM | POA: Diagnosis not present

## 2019-08-08 DIAGNOSIS — Z79899 Other long term (current) drug therapy: Secondary | ICD-10-CM

## 2019-08-08 DIAGNOSIS — Z20822 Contact with and (suspected) exposure to covid-19: Secondary | ICD-10-CM | POA: Diagnosis not present

## 2019-08-08 DIAGNOSIS — Z8701 Personal history of pneumonia (recurrent): Secondary | ICD-10-CM

## 2019-08-08 DIAGNOSIS — Z7951 Long term (current) use of inhaled steroids: Secondary | ICD-10-CM

## 2019-08-08 DIAGNOSIS — J984 Other disorders of lung: Secondary | ICD-10-CM | POA: Diagnosis not present

## 2019-08-08 DIAGNOSIS — Z1623 Resistance to quinolones and fluoroquinolones: Secondary | ICD-10-CM | POA: Diagnosis present

## 2019-08-08 DIAGNOSIS — R0689 Other abnormalities of breathing: Secondary | ICD-10-CM | POA: Diagnosis not present

## 2019-08-08 DIAGNOSIS — I2510 Atherosclerotic heart disease of native coronary artery without angina pectoris: Secondary | ICD-10-CM | POA: Diagnosis not present

## 2019-08-08 DIAGNOSIS — I480 Paroxysmal atrial fibrillation: Secondary | ICD-10-CM | POA: Diagnosis not present

## 2019-08-08 DIAGNOSIS — J439 Emphysema, unspecified: Secondary | ICD-10-CM | POA: Diagnosis present

## 2019-08-08 DIAGNOSIS — J9602 Acute respiratory failure with hypercapnia: Secondary | ICD-10-CM | POA: Diagnosis not present

## 2019-08-08 DIAGNOSIS — R600 Localized edema: Secondary | ICD-10-CM | POA: Diagnosis present

## 2019-08-08 DIAGNOSIS — A310 Pulmonary mycobacterial infection: Principal | ICD-10-CM | POA: Diagnosis present

## 2019-08-08 DIAGNOSIS — D638 Anemia in other chronic diseases classified elsewhere: Secondary | ICD-10-CM | POA: Diagnosis present

## 2019-08-08 DIAGNOSIS — J9601 Acute respiratory failure with hypoxia: Secondary | ICD-10-CM | POA: Diagnosis not present

## 2019-08-08 DIAGNOSIS — F411 Generalized anxiety disorder: Secondary | ICD-10-CM | POA: Diagnosis present

## 2019-08-08 DIAGNOSIS — M5030 Other cervical disc degeneration, unspecified cervical region: Secondary | ICD-10-CM | POA: Diagnosis present

## 2019-08-08 DIAGNOSIS — I110 Hypertensive heart disease with heart failure: Secondary | ICD-10-CM | POA: Diagnosis present

## 2019-08-08 DIAGNOSIS — R05 Cough: Secondary | ICD-10-CM | POA: Diagnosis not present

## 2019-08-08 DIAGNOSIS — J80 Acute respiratory distress syndrome: Secondary | ICD-10-CM | POA: Diagnosis not present

## 2019-08-08 DIAGNOSIS — M5134 Other intervertebral disc degeneration, thoracic region: Secondary | ICD-10-CM | POA: Diagnosis not present

## 2019-08-08 DIAGNOSIS — A498 Other bacterial infections of unspecified site: Secondary | ICD-10-CM | POA: Diagnosis present

## 2019-08-08 DIAGNOSIS — Z792 Long term (current) use of antibiotics: Secondary | ICD-10-CM

## 2019-08-08 DIAGNOSIS — Z8249 Family history of ischemic heart disease and other diseases of the circulatory system: Secondary | ICD-10-CM

## 2019-08-08 DIAGNOSIS — K219 Gastro-esophageal reflux disease without esophagitis: Secondary | ICD-10-CM | POA: Diagnosis present

## 2019-08-08 DIAGNOSIS — R069 Unspecified abnormalities of breathing: Secondary | ICD-10-CM | POA: Diagnosis not present

## 2019-08-08 DIAGNOSIS — Z9981 Dependence on supplemental oxygen: Secondary | ICD-10-CM

## 2019-08-08 DIAGNOSIS — F329 Major depressive disorder, single episode, unspecified: Secondary | ICD-10-CM | POA: Diagnosis present

## 2019-08-08 DIAGNOSIS — Z87891 Personal history of nicotine dependence: Secondary | ICD-10-CM

## 2019-08-08 DIAGNOSIS — J9621 Acute and chronic respiratory failure with hypoxia: Secondary | ICD-10-CM | POA: Diagnosis not present

## 2019-08-08 DIAGNOSIS — M7989 Other specified soft tissue disorders: Secondary | ICD-10-CM

## 2019-08-08 DIAGNOSIS — R0602 Shortness of breath: Secondary | ICD-10-CM | POA: Diagnosis not present

## 2019-08-08 DIAGNOSIS — Z7952 Long term (current) use of systemic steroids: Secondary | ICD-10-CM

## 2019-08-08 DIAGNOSIS — K2270 Barrett's esophagus without dysplasia: Secondary | ICD-10-CM | POA: Diagnosis present

## 2019-08-08 DIAGNOSIS — Z794 Long term (current) use of insulin: Secondary | ICD-10-CM

## 2019-08-08 DIAGNOSIS — R000 Tachycardia, unspecified: Secondary | ICD-10-CM | POA: Diagnosis not present

## 2019-08-08 LAB — CBC
HCT: 32 % — ABNORMAL LOW (ref 39.0–52.0)
HCT: 31.8 % — ABNORMAL LOW (ref 39.0–52.0)
Hemoglobin: 9.9 g/dL — ABNORMAL LOW (ref 13.0–17.0)
Hemoglobin: 9.8 g/dL — ABNORMAL LOW (ref 13.0–17.0)
MCH: 25.6 pg — ABNORMAL LOW (ref 26.0–34.0)
MCH: 25.6 pg — ABNORMAL LOW (ref 26.0–34.0)
MCHC: 30.9 g/dL (ref 30.0–36.0)
MCHC: 30.8 g/dL (ref 30.0–36.0)
MCV: 82.7 fL (ref 80.0–100.0)
MCV: 83 fL (ref 80.0–100.0)
Platelets: 243 10*3/uL (ref 150–400)
Platelets: 238 10*3/uL (ref 150–400)
RBC: 3.87 MIL/uL — ABNORMAL LOW (ref 4.22–5.81)
RBC: 3.83 MIL/uL — ABNORMAL LOW (ref 4.22–5.81)
RDW: 15.6 % — ABNORMAL HIGH (ref 11.5–15.5)
RDW: 15.7 % — ABNORMAL HIGH (ref 11.5–15.5)
WBC: 5.8 10*3/uL (ref 4.0–10.5)
WBC: 5.4 10*3/uL (ref 4.0–10.5)
nRBC: 0 % (ref 0.0–0.2)
nRBC: 0 % (ref 0.0–0.2)

## 2019-08-08 LAB — COMPREHENSIVE METABOLIC PANEL
ALT: 24 U/L (ref 0–44)
AST: 24 U/L (ref 15–41)
Albumin: 2.7 g/dL — ABNORMAL LOW (ref 3.5–5.0)
Alkaline Phosphatase: 60 U/L (ref 38–126)
Anion gap: 13 (ref 5–15)
BUN: 12 mg/dL (ref 6–20)
CO2: 28 mmol/L (ref 22–32)
Calcium: 8.4 mg/dL — ABNORMAL LOW (ref 8.9–10.3)
Chloride: 93 mmol/L — ABNORMAL LOW (ref 98–111)
Creatinine, Ser: 0.96 mg/dL (ref 0.61–1.24)
GFR calc Af Amer: >60 mL/min (ref >60)
GFR calc non Af Amer: >60 mL/min (ref >60)
Glucose, Bld: 141 mg/dL — ABNORMAL HIGH (ref 70–99)
Potassium: 4.1 mmol/L (ref 3.5–5.1)
Sodium: 134 mmol/L — ABNORMAL LOW (ref 135–145)
Total Bilirubin: 0.5 mg/dL (ref 0.3–1.2)
Total Protein: 7.4 g/dL (ref 6.5–8.1)

## 2019-08-08 LAB — BLOOD GAS, VENOUS
Acid-Base Excess: 5.7 mmol/L — ABNORMAL HIGH (ref 0.0–2.0)
Bicarbonate: 29.2 mmol/L — ABNORMAL HIGH (ref 20.0–28.0)
FIO2: 80
O2 Saturation: 97.8 %
Patient temperature: 37
pCO2, Ven: 48.1 mmHg (ref 44.0–60.0)
pH, Ven: 7.414 (ref 7.250–7.430)
pO2, Ven: 108 mmHg — ABNORMAL HIGH (ref 32.0–45.0)

## 2019-08-08 LAB — CREATININE, SERUM
Creatinine, Ser: 0.95 mg/dL (ref 0.61–1.24)
GFR calc Af Amer: >60 mL/min (ref >60)
GFR calc non Af Amer: >60 mL/min (ref >60)

## 2019-08-08 LAB — TROPONIN I (HIGH SENSITIVITY)
Troponin I (High Sensitivity): 3 ng/L (ref <18)
Troponin I (High Sensitivity): 3 ng/L (ref <18)

## 2019-08-08 LAB — PROCALCITONIN: Procalcitonin: <0.1 ng/mL

## 2019-08-08 LAB — GLUCOSE, CAPILLARY: Glucose-Capillary: 218 mg/dL — ABNORMAL HIGH (ref 70–99)

## 2019-08-08 LAB — CBG MONITORING, ED: Glucose-Capillary: 300 mg/dL — ABNORMAL HIGH (ref 70–99)

## 2019-08-08 LAB — BLOOD GAS, ARTERIAL
Acid-Base Excess: 6.3 mmol/L — ABNORMAL HIGH (ref 0.0–2.0)
Bicarbonate: 29.7 mmol/L — ABNORMAL HIGH (ref 20.0–28.0)
FIO2: 68
O2 Saturation: 98 %
Patient temperature: 37.2
pCO2 arterial: 50.7 mmHg — ABNORMAL HIGH (ref 32.0–48.0)
pH, Arterial: 7.404 (ref 7.350–7.450)
pO2, Arterial: 117 mmHg — ABNORMAL HIGH (ref 83.0–108.0)

## 2019-08-08 LAB — BRAIN NATRIURETIC PEPTIDE: B Natriuretic Peptide: 63 pg/mL (ref 0.0–100.0)

## 2019-08-08 LAB — HIV ANTIBODY (ROUTINE TESTING W REFLEX): HIV Screen 4th Generation wRfx: NONREACTIVE

## 2019-08-08 LAB — HEMOGLOBIN A1C
Hgb A1c MFr Bld: 8.3 % — ABNORMAL HIGH (ref 4.8–5.6)
Mean Plasma Glucose: 191.51 mg/dL

## 2019-08-08 LAB — EXPECTORATED SPUTUM ASSESSMENT W GRAM STAIN, RFLX TO RESP C

## 2019-08-08 LAB — RESPIRATORY PANEL BY RT PCR (FLU A&B, COVID)
Influenza A by PCR: NEGATIVE
Influenza B by PCR: NEGATIVE
SARS Coronavirus 2 by RT PCR: NEGATIVE

## 2019-08-08 MED ORDER — LORAZEPAM 0.5 MG PO TABS
0.5000 mg | ORAL_TABLET | Freq: Three times a day (TID) | ORAL | Status: DC | PRN
  Administered 2019-08-08 – 2019-08-14 (×12): 0.5 mg via ORAL
  Filled 2019-08-08 (×12): qty 1

## 2019-08-08 MED ORDER — CHLORHEXIDINE GLUCONATE CLOTH 2 % EX PADS
6.0000 | MEDICATED_PAD | Freq: Every day | CUTANEOUS | Status: DC
  Administered 2019-08-12: 6 via TOPICAL

## 2019-08-08 MED ORDER — FLUTICASONE PROPIONATE 50 MCG/ACT NA SUSP
2.0000 | Freq: Every day | NASAL | Status: DC | PRN
  Filled 2019-08-08: qty 16

## 2019-08-08 MED ORDER — OXYCODONE-ACETAMINOPHEN 5-325 MG PO TABS
1.0000 | ORAL_TABLET | Freq: Three times a day (TID) | ORAL | Status: DC | PRN
  Administered 2019-08-08 – 2019-08-10 (×5): 1 via ORAL
  Filled 2019-08-08 (×5): qty 1

## 2019-08-08 MED ORDER — CLOFAZIMINE POWD
100.0000 mg | Freq: Every day | Status: DC
  Administered 2019-08-08 – 2019-08-14 (×6): 100 mg
  Filled 2019-08-08 (×3): qty 1

## 2019-08-08 MED ORDER — PANTOPRAZOLE SODIUM 40 MG PO TBEC
40.0000 mg | DELAYED_RELEASE_TABLET | Freq: Two times a day (BID) | ORAL | Status: DC
  Administered 2019-08-08 – 2019-08-14 (×12): 40 mg via ORAL
  Filled 2019-08-08 (×12): qty 1

## 2019-08-08 MED ORDER — ORAL CARE MOUTH RINSE
15.0000 mL | Freq: Two times a day (BID) | OROMUCOSAL | Status: DC
  Administered 2019-08-08 – 2019-08-14 (×8): 15 mL via OROMUCOSAL

## 2019-08-08 MED ORDER — BUDESONIDE 0.5 MG/2ML IN SUSP
RESPIRATORY_TRACT | Status: AC
  Administered 2019-08-08: 0.5 mg via RESPIRATORY_TRACT
  Filled 2019-08-08: qty 2

## 2019-08-08 MED ORDER — ALBUTEROL SULFATE HFA 108 (90 BASE) MCG/ACT IN AERS
2.0000 | INHALATION_SPRAY | RESPIRATORY_TRACT | Status: AC
  Administered 2019-08-08 (×3): 2 via RESPIRATORY_TRACT
  Filled 2019-08-08: qty 6.7

## 2019-08-08 MED ORDER — TAMSULOSIN HCL 0.4 MG PO CAPS
0.4000 mg | ORAL_CAPSULE | Freq: Two times a day (BID) | ORAL | Status: DC
  Administered 2019-08-08 – 2019-08-14 (×13): 0.4 mg via ORAL
  Filled 2019-08-08 (×13): qty 1

## 2019-08-08 MED ORDER — ARFORMOTEROL TARTRATE 15 MCG/2ML IN NEBU
15.0000 ug | INHALATION_SOLUTION | Freq: Two times a day (BID) | RESPIRATORY_TRACT | Status: DC
  Administered 2019-08-08 – 2019-08-14 (×12): 15 ug via RESPIRATORY_TRACT
  Filled 2019-08-08 (×12): qty 2

## 2019-08-08 MED ORDER — PHENOL 1.4 % MT LIQD
1.0000 | OROMUCOSAL | Status: DC | PRN
  Administered 2019-08-08: 1 via OROMUCOSAL
  Filled 2019-08-08: qty 177

## 2019-08-08 MED ORDER — FUROSEMIDE 40 MG PO TABS
40.0000 mg | ORAL_TABLET | Freq: Two times a day (BID) | ORAL | Status: DC
  Administered 2019-08-08 – 2019-08-14 (×13): 40 mg via ORAL
  Filled 2019-08-08 (×13): qty 1

## 2019-08-08 MED ORDER — INSULIN ASPART 100 UNIT/ML ~~LOC~~ SOLN
0.0000 [IU] | Freq: Three times a day (TID) | SUBCUTANEOUS | Status: DC
  Administered 2019-08-08 – 2019-08-09 (×3): 8 [IU] via SUBCUTANEOUS
  Administered 2019-08-09: 2 [IU] via SUBCUTANEOUS
  Administered 2019-08-09 – 2019-08-10 (×2): 11 [IU] via SUBCUTANEOUS
  Administered 2019-08-10: 8 [IU] via SUBCUTANEOUS
  Administered 2019-08-10: 3 [IU] via SUBCUTANEOUS
  Administered 2019-08-11: 11 [IU] via SUBCUTANEOUS
  Administered 2019-08-11: 5 [IU] via SUBCUTANEOUS
  Administered 2019-08-11: 3 [IU] via SUBCUTANEOUS
  Filled 2019-08-08: qty 1

## 2019-08-08 MED ORDER — OXYCODONE HCL 5 MG PO TABS
10.0000 mg | ORAL_TABLET | Freq: Once | ORAL | Status: AC
  Administered 2019-08-09: 10 mg via ORAL
  Filled 2019-08-08: qty 2

## 2019-08-08 MED ORDER — LORATADINE 10 MG PO TABS
10.0000 mg | ORAL_TABLET | Freq: Every day | ORAL | Status: DC
  Administered 2019-08-08 – 2019-08-14 (×7): 10 mg via ORAL
  Filled 2019-08-08 (×7): qty 1

## 2019-08-08 MED ORDER — OXYCODONE HCL 5 MG PO TABS
5.0000 mg | ORAL_TABLET | Freq: Three times a day (TID) | ORAL | Status: DC | PRN
  Administered 2019-08-08 – 2019-08-09 (×4): 5 mg via ORAL
  Filled 2019-08-08 (×4): qty 1

## 2019-08-08 MED ORDER — IPRATROPIUM-ALBUTEROL 0.5-2.5 (3) MG/3ML IN SOLN
3.0000 mL | Freq: Once | RESPIRATORY_TRACT | Status: AC
  Administered 2019-08-08: 3 mL via RESPIRATORY_TRACT

## 2019-08-08 MED ORDER — LEVALBUTEROL HCL 0.63 MG/3ML IN NEBU
0.6300 mg | INHALATION_SOLUTION | Freq: Four times a day (QID) | RESPIRATORY_TRACT | Status: DC
  Administered 2019-08-08 – 2019-08-14 (×25): 0.63 mg via RESPIRATORY_TRACT
  Filled 2019-08-08 (×26): qty 3

## 2019-08-08 MED ORDER — METHYLPREDNISOLONE SODIUM SUCC 125 MG IJ SOLR
60.0000 mg | Freq: Three times a day (TID) | INTRAMUSCULAR | Status: DC
  Administered 2019-08-08 – 2019-08-10 (×7): 60 mg via INTRAVENOUS
  Filled 2019-08-08 (×7): qty 2

## 2019-08-08 MED ORDER — SODIUM CHLORIDE 0.9 % IV SOLN
1.0000 g | Freq: Once | INTRAVENOUS | Status: AC
  Administered 2019-08-08: 1 g via INTRAVENOUS
  Filled 2019-08-08: qty 10

## 2019-08-08 MED ORDER — MAGNESIUM 30 MG PO TABS: 30.0000 mg | ORAL_TABLET | Freq: Every day | ORAL | Status: DC

## 2019-08-08 MED ORDER — BENZONATATE 100 MG PO CAPS
200.0000 mg | ORAL_CAPSULE | Freq: Three times a day (TID) | ORAL | Status: DC
  Administered 2019-08-08 – 2019-08-14 (×19): 200 mg via ORAL
  Filled 2019-08-08 (×19): qty 2

## 2019-08-08 MED ORDER — IPRATROPIUM BROMIDE 0.02 % IN SOLN
0.5000 mg | Freq: Four times a day (QID) | RESPIRATORY_TRACT | Status: DC
  Administered 2019-08-08 – 2019-08-14 (×25): 0.5 mg via RESPIRATORY_TRACT
  Filled 2019-08-08 (×25): qty 2.5

## 2019-08-08 MED ORDER — ROSUVASTATIN CALCIUM 10 MG PO TABS
10.0000 mg | ORAL_TABLET | Freq: Every morning | ORAL | Status: DC
  Administered 2019-08-08 – 2019-08-14 (×7): 10 mg via ORAL
  Filled 2019-08-08 (×9): qty 1

## 2019-08-08 MED ORDER — ENOXAPARIN SODIUM 40 MG/0.4ML ~~LOC~~ SOLN
40.0000 mg | SUBCUTANEOUS | Status: DC
  Administered 2019-08-08 – 2019-08-14 (×7): 40 mg via SUBCUTANEOUS
  Filled 2019-08-08 (×7): qty 0.4

## 2019-08-08 MED ORDER — SODIUM CHLORIDE 0.9% FLUSH
3.0000 mL | Freq: Two times a day (BID) | INTRAVENOUS | Status: DC
  Administered 2019-08-08 – 2019-08-14 (×10): 3 mL via INTRAVENOUS

## 2019-08-08 MED ORDER — ALFUZOSIN HCL ER 10 MG PO TB24: 10.0000 mg | ORAL_TABLET | Freq: Every day | ORAL | Status: DC

## 2019-08-08 MED ORDER — INSULIN ASPART 100 UNIT/ML ~~LOC~~ SOLN
0.0000 [IU] | Freq: Every day | SUBCUTANEOUS | Status: DC
  Administered 2019-08-08: 2 [IU] via SUBCUTANEOUS
  Administered 2019-08-10: 3 [IU] via SUBCUTANEOUS

## 2019-08-08 MED ORDER — ALBUTEROL (5 MG/ML) CONTINUOUS INHALATION SOLN: 15.0000 mg | INHALATION_SOLUTION | Freq: Once | RESPIRATORY_TRACT | Status: DC

## 2019-08-08 MED ORDER — SODIUM CHLORIDE 0.9% FLUSH: 3.0000 mL | INTRAVENOUS | Status: DC | PRN

## 2019-08-08 MED ORDER — PANTOPRAZOLE SODIUM 40 MG PO TBEC
40.0000 mg | DELAYED_RELEASE_TABLET | Freq: Every day | ORAL | Status: DC
  Administered 2019-08-08: 40 mg via ORAL
  Filled 2019-08-08: qty 1

## 2019-08-08 MED ORDER — SODIUM CHLORIDE 0.9 % IV SOLN: 250.0000 mL | INTRAVENOUS | Status: DC | PRN

## 2019-08-08 MED ORDER — INSULIN GLARGINE 100 UNIT/ML ~~LOC~~ SOLN
40.0000 [IU] | Freq: Every day | SUBCUTANEOUS | Status: DC
  Administered 2019-08-08 – 2019-08-09 (×2): 40 [IU] via SUBCUTANEOUS
  Filled 2019-08-08 (×3): qty 0.4

## 2019-08-08 MED ORDER — GUAIFENESIN ER 600 MG PO TB12
1200.0000 mg | ORAL_TABLET | Freq: Two times a day (BID) | ORAL | Status: DC
  Administered 2019-08-08 – 2019-08-12 (×7): 1200 mg via ORAL
  Filled 2019-08-08 (×7): qty 2

## 2019-08-08 MED ORDER — RIFAMPIN 300 MG PO CAPS
600.0000 mg | ORAL_CAPSULE | Freq: Every day | ORAL | Status: DC
  Administered 2019-08-08 – 2019-08-14 (×7): 600 mg via ORAL
  Filled 2019-08-08 (×8): qty 2

## 2019-08-08 MED ORDER — METHYLPREDNISOLONE SODIUM SUCC 125 MG IJ SOLR: 125.0000 mg | Freq: Once | INTRAMUSCULAR | Status: DC

## 2019-08-08 MED ORDER — BUDESONIDE 0.5 MG/2ML IN SUSP
0.5000 mg | Freq: Two times a day (BID) | RESPIRATORY_TRACT | Status: DC
  Administered 2019-08-08 – 2019-08-14 (×12): 0.5 mg via RESPIRATORY_TRACT
  Filled 2019-08-08 (×12): qty 2

## 2019-08-08 MED ORDER — SODIUM CHLORIDE 0.9 % IV SOLN: 500.0000 mg | INTRAVENOUS | Status: DC

## 2019-08-08 MED ORDER — ACETAMINOPHEN 325 MG PO TABS: 650.0000 mg | ORAL_TABLET | Freq: Four times a day (QID) | ORAL | Status: DC | PRN

## 2019-08-08 MED ORDER — AZITHROMYCIN 250 MG PO TABS
500.0000 mg | ORAL_TABLET | Freq: Every day | ORAL | Status: DC
  Administered 2019-08-08 – 2019-08-14 (×7): 500 mg via ORAL
  Filled 2019-08-08 (×7): qty 2

## 2019-08-08 MED ORDER — POTASSIUM CHLORIDE CRYS ER 20 MEQ PO TBCR
40.0000 meq | EXTENDED_RELEASE_TABLET | Freq: Every day | ORAL | Status: DC
  Administered 2019-08-08 – 2019-08-14 (×7): 40 meq via ORAL
  Filled 2019-08-08 (×7): qty 2

## 2019-08-08 MED ORDER — LEVOFLOXACIN 500 MG PO TABS: 500.0000 mg | ORAL_TABLET | Freq: Every day | ORAL | Status: DC

## 2019-08-08 MED ORDER — MONTELUKAST SODIUM 10 MG PO TABS
10.0000 mg | ORAL_TABLET | Freq: Every day | ORAL | Status: DC
  Administered 2019-08-08 – 2019-08-13 (×6): 10 mg via ORAL
  Filled 2019-08-08 (×6): qty 1

## 2019-08-08 MED ORDER — CITALOPRAM HYDROBROMIDE 20 MG PO TABS
20.0000 mg | ORAL_TABLET | Freq: Every day | ORAL | Status: DC
  Administered 2019-08-08 – 2019-08-14 (×7): 20 mg via ORAL
  Filled 2019-08-08 (×9): qty 1

## 2019-08-08 MED ORDER — DILTIAZEM HCL ER COATED BEADS 120 MG PO CP24
120.0000 mg | ORAL_CAPSULE | Freq: Every day | ORAL | Status: DC
  Administered 2019-08-08 – 2019-08-14 (×7): 120 mg via ORAL
  Filled 2019-08-08 (×9): qty 1

## 2019-08-08 MED ORDER — OXYCODONE-ACETAMINOPHEN 10-325 MG PO TABS: 1.0000 | ORAL_TABLET | Freq: Three times a day (TID) | ORAL | Status: DC | PRN

## 2019-08-08 NOTE — ED Notes (Signed)
Pt ambulated in the room with oxygen. O2 sats went down to 92% and patient began to get SOB.

## 2019-08-08 NOTE — ED Provider Notes (Addendum)
[facility] Emergency Department Provider Note MRN:  [id number]  Arrival date & time: 08/08/19     Chief Complaint   Shortness of Breath   History of Present Illness   Ryan [name] is a 59 y.o. year-old male with a history of A. fib, CHF, COPD presenting to the ED with chief complaint of shortness of breath.  4 days of shortness of breath, gradual onset, progressively worsening.  EMS was called and they found the patient wearing 2 nasal cannulas, both at 10 L.  Patient explained that his baseline is just a single nasal cannula at 6 L.  Mild increased cough, no fever, chest pain 2 days ago but none since, no abdominal pain, no sick contacts.  Review of Systems  A complete 10 system review of systems was obtained and all systems are negative except as noted in the HPI and PMH.   Patient's Health History    Past Medical History:  Diagnosis Date  . Atrial fibrillation ([place])    Not anticoagulated  . Barrett's esophagus   . Borderline diabetes   . Bronchitis   . CAP (community acquired pneumonia) 07/10/2013   04/2015  . 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 Chronic back pain   . Chronic diastolic CHF (congestive heart failure) ([place])   . Chronic left shoulder pain   . Chronic neck pain   . Chronic respiratory failure ([place])   . Collagen vascular disease ([place])   . COPD (chronic obstructive pulmonary disease) ([place])   . Coronary atherosclerosis of native coronary artery    Mild nonobstructive disease at catheterization 2007  . DDD (degenerative disc disease)    Cervical and thoracic  . Diastolic heart failure ([place])   . GERD (gastroesophageal reflux disease)   . History of pneumonia   . Hypercholesteremia   . On home O2    6L N/C  . Polycythemia   . Seizures ([place])    Last seizure 2 yrs ago  . Smoker   . Type 2 diabetes mellitus ([place])   . Type 2 diabetes mellitus without complication [facility])     Past Surgical  History:  Procedure Laterality Date  . COLONOSCOPY  2012   Dr. Posey Pronto: normal  . ESOPHAGOGASTRODUODENOSCOPY N/A 05/04/2015   Dr. Michail Sermon: minimal erosive esophagitis, small hiatal hernia. FOOD PRECLUDED A COMPLETE EXAM. No biopsies taken  . ESOPHAGOGASTRODUODENOSCOPY  2011   [place]: Barrett's   . ESOPHAGOGASTRODUODENOSCOPY (EGD) WITH PROPOFOL N/A 05/06/2018   Dr. Gala Romney: erosive reflux esophagitis and +candida, Schatzki's ring s/p dilation, hiatal hernia.  [name] LUNG BIOPSY    . MALONEY DILATION N/A 05/06/2018   Procedure: Venia Minks DILATION;  Surgeon: Daneil Dolin, MD;  Location: [facility] ENDO SUITE;  Service: Endoscopy;  Laterality: N/A;  . Throat biopsy    . VASECTOMY    . VASECTOMY  1987    Family History  Problem Relation Age of Onset  . Hypertension Mother   . Diabetes Mother   . Heart attack Mother   . Heart failure Mother   . Hypertension Sister   . Diabetes Sister   . Heart failure Sister   . Colon cancer Neg Hx     Social History   Socioeconomic History  . Marital status: Married    Spouse name: [name]  . Number of children: 4  . Years of education: 10  . Highest education level: Not on file  Occupational History  . Not on file  Tobacco Use  . Smoking status: Former Smoker    Packs/day: 0.50    Years: 45.00    Pack years: 22.50    Types: Cigarettes    Quit date: 05/01/2015    Years since quitting: 4.2  . Smokeless tobacco: Never Used  Substance and Sexual Activity  . Alcohol use: No    Alcohol/week: 0.0 standard drinks  . Drug use: No  . Sexual activity: Yes    Birth control/protection: Surgical  Other Topics Concern  . Not on file  Social History Narrative   Patient is married [name]) and lives at home with his wife and one child.   Patient has four children.   Patient is disabled.   Patient has a high school education.   Patient is left-handed.   Patient does not drink any caffeine.   Social Determinants of Health   Financial Resource Strain:   .  Difficulty of Paying Living Expenses: Not on file  Food Insecurity:   . Worried About Charity fundraiser in the Last Year: Not on file  . Ran Out of Food in the Last Year: Not on file  Transportation Needs:   . Lack of Transportation (Medical): Not on file  . Lack of Transportation (Non-Medical): Not on file  Physical Activity:   . Days of Exercise per Week: Not on file  . Minutes of Exercise per Session: Not on file  Stress:   . Feeling of Stress : Not on file  Social Connections:   . Frequency of Communication with Friends and Family: Not on file  . Frequency of Social Gatherings with Friends and Family: Not on file  . Attends Religious Services: Not on file  . Active Member of Clubs or Organizations: Not on file  . Attends Archivist Meetings: Not on file  . Marital Status: Not on file  Intimate Partner Violence:   . Fear of Current or Ex-Partner: Not on file  . Emotionally Abused: Not on file  . Physically Abused: Not on file  . Sexually Abused: Not on file     Physical Exam   Vitals:   08/08/19 0611 08/08/19 0613  BP: (!) 142/77   Pulse: (!) 117   Resp: (!) 34   Temp:  98.9 F (37.2 C)  SpO2: 98%     CONSTITUTIONAL: Chronically ill-appearing, NAD NEURO:  Alert and oriented x 3, no focal deficits EYES:  eyes equal and reactive ENT/NECK:  no LAD, no JVD CARDIO: Tachycardic rate, well-perfused, normal S1 and S2 PULM: Diffuse wheezing and poor air movement, tachypneic, increased work of breathing, accessory muscle use GI/GU:  normal bowel sounds, non-distended, non-tender MSK/SPINE:  No gross deformities, 2+ pitting edema to bilateral lower extremities SKIN:  no rash, atraumatic PSYCH:  Appropriate speech and behavior  *Additional and/or pertinent findings included in MDM below  Diagnostic and Interventional Summary    EKG Interpretation  Date/Time:  Monday August 08 2019 06:08:50 EST Ventricular Rate:  122 PR Interval:    QRS Duration: 79 QT  Interval:  321 QTC Calculation: 458 R Axis:   76 Text Interpretation: Sinus tachycardia Confirmed by Gerlene Fee [phone]) on 08/08/2019 6:15:09 AM      Cardiac Monitoring Interpretation:  Labs Reviewed  RESPIRATORY PANEL BY RT PCR (FLU A&B, COVID)  CBC  COMPREHENSIVE METABOLIC PANEL  BRAIN NATRIURETIC PEPTIDE  BLOOD GAS, VENOUS  TROPONIN I (HIGH SENSITIVITY)    DG Chest Port 1 View    (Results Pending)  Medications  albuterol (VENTOLIN HFA) 108 (90 Base) MCG/ACT inhaler 2 puff (2 puffs Inhalation Given 08/08/19 9678)     Procedures  /  Critical Care .Critical Care Performed by: Maudie Flakes, MD Authorized by: Maudie Flakes, MD   Critical care provider statement:    Critical care time (minutes):  37   Critical care was necessary to treat or prevent imminent or life-threatening deterioration of the following conditions:  Respiratory failure   Critical care was time spent personally by me on the following activities:  Discussions with consultants, evaluation of patient's response to treatment, examination of patient, ordering and performing treatments and interventions, ordering and review of laboratory studies, ordering and review of radiographic studies, pulse oximetry, re-evaluation of patient's condition, obtaining history from patient or surrogate and review of old charts    ED Course and Medical Decision Making  I have reviewed the triage vital signs, the nursing notes, and pertinent available records from the EMR.  Pertinent labs & imaging results that were available during my care of the patient were reviewed by me and considered in my medical decision making (see below for details).     Favoring COPD exacerbation, though patient does have pitting edema to the lower extremities so CHF exacerbation is also possible.  Also considering COVID-19.  Patient was reportedly in the 70s at home on 10 L nasal cannula, patient is currently 100% on nonrebreather, will  transition to high flow nasal cannula.  Had considered noninvasive positive pressure ventilation to assist patient, but will rule out COVID-19 first.  Will need admission.  Signed out to oncoming provider at shift change.    Barth Kirks. Sedonia Small, [place] [email or web address]  Final Clinical Impressions(s) / ED Diagnoses     ICD-10-CM   1. Acute respiratory failure with hypoxia ([place])  J96.01     ED Discharge Orders    None       Discharge Instructions Discussed with and Provided to Patient:   Discharge Instructions   None       Maudie Flakes, MD 08/08/19 9381    Maudie Flakes, MD 08/08/19 0630

## 2019-08-08 NOTE — ED Notes (Signed)
Pt eating lunch

## 2019-08-08 NOTE — Consult Note (Signed)
PULMONARY / CRITICAL CARE MEDICINE   NAME:  Ryan [name], MRN:  [id number], DOB:  1961-03-27, LOS: 0 ADMISSION DATE:  08/08/2019, CONSULTATION DATE:  1/25 REFERRING MD:  Triad , CHIEF COMPLAINT:  Sob   BRIEF HISTORY:    57 yowm seen in 2016 in pulmonary clinic by [facility] with GOLD II copd spirometry /02 dep  then @ 3lpm NP and followed by Luan Pulling since with Dx MAI  Admit 1/25 with one week h/o progressive resting sob/ cough congestion in resp distress on arrival  and pccm service asked to evaluate  By Triad.  HISTORY OF PRESENT ILLNESS   58 yowm 02 dep, severe copd with baseline doe x 100 ft = MMRC3 = can't walk 100 yards even at a slow pace at a flat grade s stopping due to sob  Acutely worse x one week with progressive sob/cough/ congestion with thick green sputum and leg swelling R > L  "lke always" admit thru er am 1/25.  Typically now stays on 6pm NP and uses levaquin refillable for flares per Luan Pulling last instructions and started another course 3 d PTA   No obvious patterns in terms of resp symptoms in terms of  day to day or daytime variability or assoc  mucus plugs or hemoptysis or  chest tightness, subjective wheeze or overt sinus or hb symptoms.    Also denies any obvious fluctuation of symptoms with weather or environmental changes or other aggravating or alleviating factors except as outlined above   No unusual exposure hx or h/o childhood pna/ asthma or knowledge of premature birth.  Current Allergies, Complete Past Medical History, Past Surgical History, Family History, and Social History were reviewed in [employer] record.  ROS  The following are not active complaints unless bolded Hoarseness, sore throat, dysphagia, dental problems, itching, sneezing,  nasal congestion or discharge of excess mucus or purulent secretions, ear ache,   fever, chills, sweats, unintended wt loss or wt gain, classically pleuritic or exertional cp,  orthopnea pnd or arm/hand swelling   or leg swelling R> L pattern recurrent , presyncope, palpitations, abdominal pain, anorexia, nausea, vomiting, diarrhea  or change in bowel habits or change in bladder habits, change in stools or change in urine, dysuria, hematuria,  rash, arthralgias, visual complaints, headache, numbness, weakness or ataxia or problems with walking or coordination,  change in mood or  memory.            SIGNIFICANT PAST MEDICAL HISTORY   COPD  Documented  Severe GOLD II / borderline III  quit smoking 04/2015 PFT's  03/02/08  FEV1 1.78 (52 % ) ratio 39  p no % improvement from saba with DLCO  46 % corrects to 42 % for alv volume      SIGNIFICANT EVENTS:   STUDIES:    CULTURES:  Sputum 1/25  Mod wbc, rare GPCs in clusters Covid PCR 1/25 neg   ANTIBIOTICS:  Rocephin 1/25 >>> Zpak 1/25 >>>   LINES/TUBES:    CONSULTANTS:  PCCM 1/25   SUBJECTIVE:  Moderate increased wob at rest sitting   CONSTITUTIONAL: BP 112/60   Pulse (!) 104   Temp 98.9 F (37.2 C) (Oral)   Resp (!) 35   Ht 5\' 8"  (1.727 m)   Wt 79.4 kg   SpO2 96%   BMI 26.61 kg/m  on 8 lpm NP   No intake/output data recorded.        PHYSICAL EXAM: General:  Sitting on BSC on  my arrival  Neuro:  Alert, approp no deficits Cardiovascular:  RRR no s3  - sinus tach on ekg  Lungs:  Pan exp distant wheeze  Abdomen:  Mod distended not tender Musculoskeletal:  Warm s calf tenderness  2+ pitting or R and 1+ on L  Skin: no breakdown    I personally reviewed images and agree with radiology impression as follows:   Chest HRCT  1/25 1. Thick-walled large cavitary lesion with internal debris in the posterior left hemithorax, markedly progressive from 09/02/2018. A chronic infectious/inflammatory etiology, including mycetoma, can have this appearance. Malignancy cannot be excluded. 2. Trace left pleural effusion. 3. Significant respiratory motion degrades image quality. No findings worrisome for interstitial lung disease. 4. Aortic  atherosclerosis (ICD10-I70.0). Coronary artery calcification. 5. Enlarged pulmonic trunk, indicative of pulmonary arterial hypertension. 6. 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 L chest  by CT 1/25  - very limited options for w/u or rx at this point but at least needs sputum for afb and Quant GOLD TB  And check sputum also for fungus as this could be aspergilloma/ mycetoma with bronchogenic ca in the ddx as well   4) chronic / recurrent LE edema R>> L  -pt says flared 1 week PTA > needs venous dopplers   4) probable anemia of chronic dz  - track hgb on PPI empirically    SUMMARY OF TODAY'S PLAN:  Admit to ICU/ rx copd and collect sputum / Bipap prn     LABS  Glucose Recent Labs  Lab 08/08/19 1318  GLUCAP 300*    BMET Recent Labs  Lab 08/08/19 0614 08/08/19 0828  NA 134*  --   K 4.1  --   CL 93*  --   CO2 28  --   BUN 12  --   CREATININE 0.96 0.95  GLUCOSE 141*  --     Liver Enzymes Recent Labs  Lab 08/08/19 0614  AST 24  ALT 24  ALKPHOS 60  BILITOT 0.5  ALBUMIN 2.7*    Electrolytes Recent Labs  Lab 08/08/19 0614  CALCIUM 8.4*    CBC Recent Labs  Lab 08/08/19 0614 08/08/19 0828  WBC 5.8 5.4  HGB 9.9* 9.8*  HCT 32.0* 31.8*  PLT 243 238    ABG Recent Labs  Lab 08/08/19 0815  PHART 7.404  PCO2ART 50.7*  PO2ART 117*    Coag's No results for input(s): APTT, INR in the last 168  hours.  Sepsis Markers Recent Labs  Lab 08/08/19 0828  PROCALCITON <0.10    Cardiac Enzymes No results for input(s): TROPONINI, PROBNP in the last 168 hours.  PAST MEDICAL HISTORY :   He  has a past medical history of Atrial fibrillation ([place]), Barrett's esophagus, Borderline diabetes, Bronchitis, CAP (community acquired pneumonia) (07/10/2013), Cavitary lesion of lung (05/07/2011), Chronic back pain, Chronic  diastolic CHF (congestive heart failure) (HCC), Chronic left shoulder pain, Chronic neck pain, Chronic respiratory failure (HCC), Collagen vascular disease ([place]), COPD (chronic obstructive pulmonary disease) ([place]), Coronary atherosclerosis of native coronary artery, DDD (degenerative disc disease), Diastolic heart failure ([place]), GERD (gastroesophageal reflux disease), History of pneumonia, Hypercholesteremia, On home O2, Polycythemia, Seizures ([place]), Smoker, Type 2 diabetes mellitus ([place]), and Type 2 diabetes mellitus without complication ([place]).  PAST SURGICAL HISTORY:  He  has a past surgical history that includes Colonoscopy (2012); Vasectomy; Throat biopsy; Lung biopsy; Vasectomy (1987); Esophagogastroduodenoscopy (N/A, 05/04/2015); Esophagogastroduodenoscopy (2011); Esophagogastroduodenoscopy (egd) with propofol (N/A, 05/06/2018); and maloney dilation (N/A, 05/06/2018).  Allergies  Allergen Reactions  . Albuterol Palpitations    Causes pt to have a-fib   . 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 Influenza Vaccine Live Swelling    No current facility-administered medications on file prior to encounter.   Current Outpatient Medications on File Prior to Encounter  Medication Sig  . alfuzosin (UROXATRAL) 10 MG 24 hr tablet Take 10 mg by mouth daily with breakfast.  . AMBULATORY NON FORMULARY MEDICATION Take 100 mg by mouth daily. Medication Name: Clofazimine  . azithromycin (ZITHROMAX) 500 MG tablet 1 Tab daily   . BD PEN NEEDLE NANO 2ND GEN 32G X 4 MM MISC INJECT UP TO 5 TIMES DAILY  . benzonatate (TESSALON) 200 MG capsule Take 200 mg by mouth 3 (three) times daily.  . budesonide (PULMICORT) 0.25 MG/2ML nebulizer solution Take 2 mLs (0.25 mg total) by nebulization 2 (two) times daily. (Patient taking differently: Take 0.25 mg by nebulization 4 (four) times daily. )  . CARTIA XT 120 MG 24 hr capsule Take 1 capsule (120 mg total) by mouth daily.  . citalopram (CELEXA) 20 MG tablet Take 1 tablet (20 mg total) by mouth daily.  . diphenhydrAMINE (BENADRYL) 25 mg capsule Take 25 mg by mouth every 8 (eight) hours as needed. Felt mouth swelling at ED visit 08/16/18.  . fexofenadine (ALLEGRA) 180 MG tablet Take 180 mg by mouth daily.  . fluticasone (FLONASE) 50 MCG/ACT nasal spray Place 2 sprays into both nostrils daily as needed.   . Fluticasone-Umeclidin-Vilant (TRELEGY ELLIPTA) 100-62.5-25 MCG/INH AEPB Inhale 1 puff into the lungs daily.  . furosemide (LASIX) 40 MG tablet Take 1 tablet (40 mg total) by mouth 2 (two) times daily. As needed for swelling  . guaiFENesin (MUCINEX) 600 MG 12 hr tablet Take 600 mg by mouth 2 (two) times daily.  [name] HYDROcodone-homatropine (HYCODAN) 5-1.5 MG/5ML syrup Take 5 mLs by mouth every 6 (six) hours as needed for cough.  . insulin glargine (LANTUS) 100 UNIT/ML injection Inject 0.6 mLs (60 Units total) into the skin daily. 24 units daily (Patient taking differently: Inject 58 Units into the skin daily. 24 units daily)  . insulin lispro (HUMALOG) 100 UNIT/ML injection Take by sliding scale provided by hospital maximum amount daily is 60 units.  Check blood sugar 4 times a day (Patient taking differently: Inject 0-15 Units into the skin 3 (three) times daily with meals. Take by sliding scale provided by hospital maximum amount daily is 60 units.  Check blood sugar 4 times a day)  . ipratropium (ATROVENT) 0.02 % nebulizer solution Take 0.5 mg by nebulization every 6 (six) hours as needed.    . levalbuterol (XOPENEX) 0.63 MG/3ML nebulizer solution Take 0.63 mg by nebulization every 6 (six) hours as needed.   [name] levofloxacin (LEVAQUIN) 500 MG tablet Take  500 mg by mouth daily. Started levaquin on 08/05/2019. Spouse stated dr Luan Pulling gave patient multiple prescriptions for this before he retired. He is not on this by doctors orders. When asked spouse why he is taking antibiotic, she states it is to make him feel better. She didn't give any symptoms to why he is taking it.  [name] LORazepam (ATIVAN) 0.5 MG tablet Take 1 tablet (0.5 mg total) by mouth 2 (two) times daily as needed for anxiety or sleep. (Patient taking differently: Take 0.5 mg by mouth 4 (four) times daily. )  . metFORMIN (GLUCOPHAGE) 500 MG tablet Take 1 tablet (500 mg total) by mouth daily with breakfast.  . methocarbamol (ROBAXIN) 500 MG tablet Take 500 mg by mouth 2 (two) times daily.  . montelukast (SINGULAIR) 10 MG tablet Take 1 tablet by mouth daily.  . nitroGLYCERIN (NITROSTAT) 0.4 MG SL tablet Place 1 tablet (0.4 mg total) under the tongue every 5 (five) minutes as needed for chest pain.  [name] omeprazole (PRILOSEC) 20 MG capsule Take 1 capsule (20 mg total) by mouth 2 (two) times daily before a meal.  . oxyCODONE-acetaminophen (PERCOCET) 10-325 MG tablet Take 1 tablet by mouth every 4 (four) hours as needed for pain.  [name] oxymetazoline (AFRIN) 0.05 % nasal spray Place 1 spray into both nostrils 2 (two) times daily.  . potassium chloride SA (K-DUR,KLOR-CON) 20 MEQ tablet Take 1 tablet twice daily while on Lasix  . predniSONE (DELTASONE) 10 MG tablet Take 2 daily for 1 week then 1 daily (Patient taking differently: Take 20 mg by mouth daily with breakfast. Takes 2 tablets daily.)  . rifampin (RIFADIN) 300 MG capsule Take 2 capsules (600 mg total) by mouth daily. Long-term medication  . rosuvastatin (CRESTOR) 10 MG tablet Take 10 mg by mouth every morning.   . SYMBICORT 160-4.5 MCG/ACT inhaler INL 2 PFS PO BID  . tamsulosin  (FLOMAX) 0.4 MG CAPS capsule Take 1 capsule (0.4 mg total) by mouth 2 (two) times daily.  [name] acetaminophen (TYLENOL) 325 MG tablet Take 2 tablets (650 mg total) by mouth every 6 (six) hours as needed for mild pain, moderate pain, fever or headache (or Fever >/= 101). (Patient not taking: Reported on 08/08/2019)    FAMILY HISTORY:   His family history includes Diabetes in his mother and sister; Heart attack in his mother; Heart failure in his mother and sister; Hypertension in his mother and sister. There is no history of Colon cancer.  SOCIAL HISTORY:  He  reports that he quit smoking about 4 years ago. His smoking use included cigarettes. He has a 22.50 pack-year smoking history. He has never used smokeless tobacco. He reports that he does not drink alcohol or use drugs.     Christinia Gully, MD Pulmonary and [place] [phone] After 5:30 PM or weekends, use [facility] [phone]

## 2019-08-08 NOTE — ED Notes (Signed)
Pt's contact information  [place] [phone]

## 2019-08-08 NOTE — ED Provider Notes (Signed)
Blood pressure (!) 142/77, pulse (!) 117, temperature 98.9 F (37.2 C), temperature source Oral, resp. rate (!) 34, height 5\' 8"  (1.727 m), weight 79.4 kg, SpO2 100 %.  Assuming care from Dr. Sedonia Small.  In short, Ryan [name] is a 59 y.o. male with a chief complaint of Shortness of Breath .  Refer to the original H&P for additional details.  The current plan of care is to f/u on COVID test and admit.   07:09 AM  Covid testing is negative by PCR.  No significant CO2 retention on VBG.  Chest x-ray with possible infiltrate.  Starting Rocephin and azithromycin and will give DuoNeb.  Patient on 15 L high flow nasal cannula and looking more comfortable.  Baseline 6 L at home.  Plan for admit.  Discussed patient's case with [facility], Dr. Dyann Kief to request admission. Patient and family (if present) updated with plan. Care transferred to [facility] service.  I reviewed all nursing notes, vitals, pertinent old records, EKGs, labs, imaging (as available).   EKG Interpretation  Date/Time:  Monday August 08 2019 06:08:50 EST Ventricular Rate:  122 PR Interval:    QRS Duration: 79 QT Interval:  321 QTC Calculation: 458 R Axis:   76 Text Interpretation: Sinus tachycardia Confirmed by Gerlene Fee [phone]) on 08/08/2019 6:15:09 AM         Naydelin Ziegler, [facility], MD 08/08/19 [phone]

## 2019-08-08 NOTE — ED Triage Notes (Addendum)
Pt c/o sob that has gotten worse over the past few days, pt admits to cough that is productive with yellow sputum, denies any fever, ems reports that pt had on two nasal cannulas upon their arrival to pt, they changed pt to one cannnula at 6lpm with drop of pulse ox to 76% pt arrived to the er on NRB with pulse ox of 98% Pt was given 125 mg solu medrol IM by ems

## 2019-08-08 NOTE — Therapy (Signed)
PT Cancellation Note  Patient Details Name: Ryan [name] MRN: [id number] DOB: October 29, 1960   Cancelled Treatment:     Upon arrival, pt on 13LPM O2 and reports "extremely short of breath" lying in bed, O2 sat 97-98%. Pt able to perform supine ankle pumps and heel slides with O2 sat 97-98% with increased SOB with activity. Pt refuses to stand or ambulate, reports "I walked with the nurse and my oxygen dropped and they made me sit down". Pt began coughing, grabbed R abdomen and reports extreme pain, O2 sat decreased to 92%. Pt's O2 sat 94%, on 13LPM O2, and pt resting in bed with call bell in hand when therapist exited room. RN notified of pt's pain and O2 decrease with coughing and verbalized understanding. Will complete evaluation tomorrow if appropriate.   Talbot Grumbling PT, DPT 08/08/19, 4:18 PM [phone]

## 2019-08-08 NOTE — H&P (Signed)
History and Physical    Ryan [name] PTW:[id number] DOB: 11-25-1960 DOA: 08/08/2019  Referring MD/NP/PA: Dr. Laverta [name] PCP: Ryan Du, MD  Patient coming from: Home  Chief Complaint: Productive cough, worsening shortness of breath and hypoxia  HPI: Ryan [name] is a 59 y.o. 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  Patient reports that for the last 4 days her breathing has decompensates and he has required higher levels oxygen at home.  He reports no fever, no chills, no chest pain, no nausea, no vomiting, no abdominal pain.  Positive intermittent productive coughing spells appreciated despite increasing his oxygen to 10 [name] experience.  Limited exertion capability and noted his O2 sat to be below 80%. In the ED patient required initially nonrebreather mask and subsequently high flow nasal cannula supplementation 15 [name]; chest x-ray 8 demonstrating diffuse emphysematous changes and bronchitic changes.  Negative Covid test; normal WBCs and otherwise stable vital signs.  Patient receiving nebulizer treatment, started on IV Solu-Medrol and TRH consulted to admit patient for further evaluation and management.  ABG and CT scan of the chest were ordered.  Past Medical/Surgical History: Past Medical History:  Diagnosis Date  . Atrial fibrillation ([place])    Not anticoagulated  . Barrett's esophagus   . Borderline diabetes   . Bronchitis   . CAP (community acquired pneumonia) 07/10/2013   04/2015  . Cavitary lesion of lung 05/07/2011   Cultures grew MAI, tx  antibiotics  . Chronic back pain   . Chronic diastolic CHF (congestive heart failure) ([place])   . Chronic left shoulder pain   . Chronic neck pain   . Chronic respiratory failure ([place])   . Collagen vascular disease ([place])   . COPD (chronic obstructive pulmonary disease) ([place])   . Coronary atherosclerosis of native coronary artery    Mild nonobstructive disease at catheterization 2007  . DDD (degenerative disc disease)    Cervical and thoracic  . Diastolic heart failure ([place])   . GERD (gastroesophageal reflux disease)   . History of pneumonia   . Hypercholesteremia   . On home O2    6L N/C  . Polycythemia   . Seizures ([place])    Last seizure 2 yrs ago  . Smoker   . Type 2 diabetes mellitus ([place])   . Type 2 diabetes mellitus without complication [facility])     Past Surgical History:  Procedure Laterality Date  . COLONOSCOPY  2012   Dr. Posey [name]: normal  . ESOPHAGOGASTRODUODENOSCOPY N/A 05/04/2015   Dr. Michail [name]: minimal erosive esophagitis, small hiatal hernia. FOOD PRECLUDED A COMPLETE EXAM. No biopsies taken  . ESOPHAGOGASTRODUODENOSCOPY  2011   [place]: Barrett's   . ESOPHAGOGASTRODUODENOSCOPY (EGD) WITH PROPOFOL N/A 05/06/2018   Dr. Gala [name]: erosive reflux esophagitis and +candida, Schatzki's ring s/p dilation, hiatal hernia.  [name] LUNG BIOPSY    . MALONEY DILATION N/A 05/06/2018   Procedure: Ryan [name] DILATION;  Surgeon: Ryan Dolin, MD;  Location: [facility];  Service: Endoscopy;  Laterality: N/A;  . Throat biopsy    . VASECTOMY    . VASECTOMY  1987    Social History:  reports that he quit smoking about 4 years ago. His  smoking use included cigarettes. He has a 22.50 pack-year smoking history. He has never used smokeless tobacco. He reports that he does not drink alcohol or use drugs.  Allergies: Allergies  Allergen Reactions  . Albuterol Palpitations    Causes pt to have a-fib   . Ciprofloxacin Other (See Comments)    Patient experiences vision loss from long-term and  short-term use  . Influenza Vaccine Live Swelling    Family History:  Family History  Problem Relation Age of Onset  . Hypertension Mother   . Diabetes Mother   . Heart attack Mother   . Heart failure Mother   . Hypertension Sister   . Diabetes Sister   . Heart failure Sister   . Colon cancer Neg Hx     Prior to Admission medications   Medication Sig Start Date End Date Taking? Authorizing Provider  alfuzosin (UROXATRAL) 10 MG 24 hr tablet Take 10 mg by mouth daily with breakfast.   Yes [provider]  AMBULATORY NON FORMULARY MEDICATION Take 100 mg by mouth daily. Medication Name: Clofazimine 06/07/19  Yes Ryan [name], RPH-CPP  azithromycin (ZITHROMAX) 500 MG tablet 1 Tab daily 07/19/19  Yes Comer, Ryan Regal, MD  BD PEN NEEDLE NANO 2ND GEN 32G X 4 MM MISC INJECT UP TO 5 TIMES DAILY 06/29/19  Yes [provider]  benzonatate (TESSALON) 200 MG capsule Take 200 mg by mouth 3 (three) times daily.   Yes [provider]  budesonide (PULMICORT) 0.25 MG/2ML nebulizer solution Take 2 mLs (0.25 mg total) by nebulization 2 (two) times daily. Patient taking differently: Take 0.25 mg by nebulization 4 (four) times daily.  10/24/17  Yes Ryan Du, MD  CARTIA XT 120 MG 24 hr capsule Take 1 capsule (120 mg total) by mouth daily. 01/01/18  Yes Branch, Ryan Guild, MD  citalopram (CELEXA) 20 MG tablet Take 1 tablet (20 mg total) by mouth daily. 05/30/19  Yes Ryan Du, MD  diphenhydrAMINE (BENADRYL) 25 mg capsule Take 25 mg by mouth every 8 (eight) hours as needed. Felt mouth swelling at ED visit 08/16/18.   Yes [provider]  fexofenadine (ALLEGRA) 180 MG tablet Take 180 mg by mouth daily.   Yes [provider]  fluticasone (FLONASE) 50 MCG/ACT nasal spray Place 2 sprays into both nostrils daily as needed.  02/24/18  Yes [provider]  Fluticasone-Umeclidin-Vilant (TRELEGY ELLIPTA) 100-62.5-25 MCG/INH AEPB Inhale 1 puff into the  lungs daily.   Yes [provider]  furosemide (LASIX) 40 MG tablet Take 1 tablet (40 mg total) by mouth 2 (two) times daily. As needed for swelling 05/29/19 05/28/20 Yes Ryan Du, MD  guaiFENesin (MUCINEX) 600 MG 12 hr tablet Take 600 mg by mouth 2 (two) times daily.   Yes [provider]  HYDROcodone-homatropine (HYCODAN) 5-1.5 MG/5ML syrup Take 5 mLs by mouth every 6 (six) hours as needed for cough.   Yes [provider]  insulin glargine (LANTUS) 100 UNIT/ML injection Inject 0.6 mLs (60 Units total) into the skin daily. 24 units daily Patient taking differently: Inject 58 Units into the skin daily. 24 units daily 05/29/19  Yes Ryan Du, MD  insulin lispro (HUMALOG) 100 UNIT/ML injection Take by sliding scale provided by hospital maximum amount daily is 60 units.  Check blood sugar 4 times a day Patient taking differently: Inject 0-15 Units into the skin 3 (three) times daily with meals. Take by sliding scale provided by hospital maximum amount daily is 60 units.  Check blood sugar 4 times a day 09/23/17  Yes Ryan Du, MD  ipratropium (ATROVENT) 0.02 % nebulizer solution Take 0.5 mg by nebulization every 6 (six) hours as needed.  09/29/17  Yes [provider]  levalbuterol Penne Lash) 0.63 MG/3ML nebulizer solution Take 0.63 mg by nebulization every 6 (six) hours as needed.    Yes [provider]  levofloxacin (LEVAQUIN) 500 MG tablet Take 500 mg by mouth daily. Started levaquin on 08/05/2019. Spouse stated dr Luan Pulling gave patient multiple prescriptions for this before he retired. He is not on this by doctors orders. When asked spouse why he is taking antibiotic, she states it is to make him feel better. She didn't give any symptoms to why he is taking it. 07/07/19  Yes [provider]  LORazepam (ATIVAN) 0.5 MG tablet Take 1 tablet (0.5 mg total) by mouth 2 (two) times daily as needed for anxiety or sleep. Patient taking  differently: Take 0.5 mg by mouth 4 (four) times daily.  05/04/15  Yes Mikhail, [place], DO  metFORMIN (GLUCOPHAGE) 500 MG tablet Take 1 tablet (500 mg total) by mouth daily with breakfast. 09/15/14  Yes Rexene Alberts, MD  methocarbamol (ROBAXIN) 500 MG tablet Take 500 mg by mouth 2 (two) times daily. 06/24/19  Yes [provider]  montelukast (SINGULAIR) 10 MG tablet Take 1 tablet by mouth daily. 02/17/19  Yes [provider]  nitroGLYCERIN (NITROSTAT) 0.4 MG SL tablet Place 1 tablet (0.4 mg total) under the tongue every 5 (five) minutes as needed for chest pain. 04/22/13  Yes Lendon Colonel, NP  omeprazole (PRILOSEC) 20 MG capsule Take 1 capsule (20 mg total) by mouth 2 (two) times daily before a meal. 08/04/18  Yes Annitta Needs, NP  oxyCODONE-acetaminophen (PERCOCET) 10-325 MG tablet Take 1 tablet by mouth every 4 (four) hours as needed for pain.   Yes [provider]  oxymetazoline (AFRIN) 0.05 % nasal spray Place 1 spray into both nostrils 2 (two) times daily.   Yes [provider]  potassium chloride SA (K-DUR,KLOR-CON) 20 MEQ tablet Take 1 tablet twice daily while on Lasix 09/10/18  Yes Ryan Du, MD  predniSONE (DELTASONE) 10 MG tablet Take 2 daily for 1 week then 1 daily Patient taking differently: Take 20 mg by mouth daily with breakfast. Takes 2 tablets daily. 10/24/17  Yes Ryan Du, MD  rifampin (RIFADIN) 300 MG capsule Take 2 capsules (600 mg total) by mouth daily. Long-term medication 07/19/19  Yes Comer, Ryan Regal, MD  rosuvastatin (CRESTOR) 10 MG tablet Take 10 mg by mouth every morning.  04/10/18  Yes [provider]  SYMBICORT 160-4.5 MCG/ACT inhaler INL 2 PFS PO BID 09/23/18  Yes [provider]  tamsulosin (FLOMAX) 0.4 MG CAPS capsule Take 1 capsule (0.4 mg total) by mouth 2 (two) times daily. 09/19/18  Yes Roxan Hockey, MD  acetaminophen (TYLENOL) 325 MG tablet Take 2 tablets (650 mg total) by mouth every 6 (six)  hours as needed for mild pain, moderate pain, fever or headache (or Fever >/= 101). Patient not taking: Reported on 08/08/2019 09/19/18   Roxan Hockey, MD    Review of Systems:  Negative except as otherwise mentioned in HPI.   Physical Exam: Vitals:   08/08/19 1300 08/08/19 1330 08/08/19 1400 08/08/19 1430  BP: 123/80 126/77 (!) 87/72 112/60  Pulse: 95 87 98 (!) 104  Resp: (!) 25 (!) 21 (!) 27 (!) 35  Temp:      TempSrc:  SpO2: 100% 100% 98% 96%  Weight:      Height:       Constitutional: In mild distress secondary to shortness of breath, tachypnea and increased oxygen supplementation need.  Denies chest pain, fever, chills, nausea, vomiting, abdominal pain or any other complaints.   Eyes: PERRL, lids and conjunctivae normal, no icterus. ENMT: Mucous membranes are moist. Posterior pharynx clear of any exudate or lesions. Neck: normal, supple, no masses, no thyromegaly, no JVD. Respiratory: Diffuse rhonchi right, positive tachypnea, requiring high flow nasal cannula supplementation 15 [name] to maintain O2 sat above 92%; mild use of accessory muscles fluids appreciated on arrival to the ED.  Positive expiratory wheezing. Cardiovascular: Regular rate and rhythm, no rubs, no gallops.  1+ lower extremity edema bilaterally appreciated on exam. Abdomen: no tenderness, no masses palpated. No hepatosplenomegaly. Bowel sounds positive.  Musculoskeletal: no clubbing / cyanosis. No joint deformity upper and lower extremities. Good ROM, no contractures. Normal muscle tone.  Skin: no rashes, lesions, ulcers. No induration Neurologic: CN 2-12 grossly intact. Sensation intact, DTR normal. Strength 4/5 in all 4 limbs in the setting of poor effort and increased shortness of breath..  Psychiatric: Normal judgment and insight. Alert and oriented x 3. Normal mood.    Labs on Admission: I have personally reviewed the following labs and imaging studies  CBC: Recent Labs  Lab 08/08/19 0614  08/08/19 0828  WBC 5.8 5.4  HGB 9.9* 9.8*  HCT 32.0* 31.8*  MCV 82.7 83.0  PLT 243 527   Basic Metabolic Panel: Recent Labs  Lab 08/08/19 0614 08/08/19 0828  NA 134*  --   K 4.1  --   CL 93*  --   CO2 28  --   GLUCOSE 141*  --   BUN 12  --   CREATININE 0.96 0.95  CALCIUM 8.4*  --    GFR: Estimated Creatinine Clearance: 80 mL/min (by C-G formula based on SCr of 0.95 mg/dL).   Liver Function Tests: Recent Labs  Lab 08/08/19 0614  AST 24  ALT 24  ALKPHOS 60  BILITOT 0.5  PROT 7.4  ALBUMIN 2.7*   HbA1C: Recent Labs    08/08/19 0828  HGBA1C 8.3*   CBG: Recent Labs  Lab 08/08/19 1318  GLUCAP 300*   Urine analysis:    Component Value Date/Time   COLORURINE YELLOW 09/24/2018 1426   APPEARANCEUR CLEAR 09/24/2018 1426   LABSPEC 1.025 09/24/2018 1426   PHURINE 5.0 09/24/2018 1426   GLUCOSEU >=500 (A) 09/24/2018 1426   HGBUR SMALL (A) 09/24/2018 1426   BILIRUBINUR NEGATIVE 09/24/2018 1426   KETONESUR NEGATIVE 09/24/2018 1426   PROTEINUR NEGATIVE 09/24/2018 1426   UROBILINOGEN 0.2 09/08/2014 1444   NITRITE NEGATIVE 09/24/2018 1426   LEUKOCYTESUR NEGATIVE 09/24/2018 1426   Sepsis Labs: @LABRCNTIP (procalcitonin:4,lacticidven:4) ) Recent Results (from the past 240 hour(s))  Respiratory Panel by RT PCR (Flu A&B, Covid) - Nasopharyngeal Swab     Status: None   Collection Time: 08/08/19  6:16 AM   Specimen: Nasopharyngeal Swab  Result Value Ref Range Status   SARS Coronavirus 2 by RT PCR NEGATIVE NEGATIVE Final    Comment: (NOTE) SARS-CoV-2 target nucleic acids are NOT DETECTED. The SARS-CoV-2 RNA is generally detectable in upper respiratoy specimens during the acute phase of infection. The lowest concentration of SARS-CoV-2 viral copies this assay can detect is 131 copies/mL. A negative result does not preclude SARS-Cov-2 infection and should not be used as the sole basis for treatment or other patient management decisions.  A negative result may occur  with  improper specimen collection/handling, submission of specimen other than nasopharyngeal swab, presence of viral mutation(s) within the areas targeted by this assay, and inadequate number of viral copies (<131 copies/mL). A negative result must be combined with clinical observations, patient history, and epidemiological information. The expected result is Negative. Fact Sheet for Patients:  PinkCheek.be Fact Sheet for Healthcare Providers:  GravelBags.it This test is not yet ap proved or cleared by the [place] FDA and  has been authorized for detection and/or diagnosis of SARS-CoV-2 by FDA under an Emergency Use Authorization (EUA). This EUA will remain  in effect (meaning this test can be used) for the duration of the COVID-19 declaration under Section 564(b)(1) of the Act, 21 U.S.C. section 360bbb-3(b)(1), unless the authorization is terminated or revoked sooner.    Influenza A by PCR NEGATIVE NEGATIVE Final   Influenza B by PCR NEGATIVE NEGATIVE Final    Comment: (NOTE) The Xpert Xpress SARS-CoV-2/FLU/RSV assay is intended as an aid in  the diagnosis of influenza from Nasopharyngeal swab specimens and  should not be used as a sole basis for treatment. Nasal washings and  aspirates are unacceptable for Xpert Xpress SARS-CoV-2/FLU/RSV  testing. Fact Sheet for Patients: PinkCheek.be Fact Sheet for Healthcare Providers: GravelBags.it This test is not yet approved or cleared by the [place] FDA and  has been authorized for detection and/or diagnosis of SARS-CoV-2 by  FDA under an Emergency Use Authorization (EUA). This EUA will remain  in effect (meaning this test can be used) for the duration of the  Covid-19 declaration under Section 564(b)(1) of the Act, 21  U.S.C. section 360bbb-3(b)(1), unless the authorization is  terminated or revoked. Performed  at [facility], [address], [place], [place] [address]   Culture, sputum-assessment     Status: None   Collection Time: 08/08/19  8:17 AM   Specimen: Expectorated Sputum  Result Value Ref Range Status   Specimen Description EXPECTORATED SPUTUM  Final   Special Requests NONE  Final   Sputum evaluation   Final    THIS SPECIMEN IS ACCEPTABLE FOR SPUTUM CULTURE Performed at [facility], [address], [place], [place] [address]    Report Status 08/08/2019 FINAL  Final  Culture, respiratory     Status: None (Preliminary result)   Collection Time: 08/08/19  8:17 AM  Result Value Ref Range Status   Specimen Description   Final    EXPECTORATED SPUTUM Performed at [facility], [address], [place], [place] [address]    Special Requests   Final    NONE Reflexed from [id number] Performed at [facility], [address] [place], [place] [address]    Gram Stain   Final    MODERATE WBC PRESENT,BOTH PMN AND MONONUCLEAR RARE GRAM POSITIVE COCCI IN CLUSTERS Performed at [facility], [place] [address] [place], [place] [address]    Culture PENDING  Incomplete   Report Status PENDING  Incomplete     Radiological Exams on Admission: CT Chest High Resolution  Result Date: 08/08/2019 CLINICAL DATA:  Shortness of breath, productive cough. EXAM: CT CHEST WITHOUT CONTRAST TECHNIQUE: Multidetector CT imaging of the chest was performed following the standard protocol without intravenous contrast. High resolution imaging of the lungs, as well as inspiratory and expiratory imaging, was performed. COMPARISON:  09/02/2018, 09/01/2017 and 11/28/2015. FINDINGS: Cardiovascular: Atherosclerotic calcification of the aorta and coronary arteries. Pulmonic trunk is enlarged. Heart size normal. No pericardial effusion. Mediastinum/Nodes: No pathologically enlarged  mediastinal or axillary lymph nodes. Hilar regions are difficult to evaluate without IV contrast. Esophagus is unremarkable. Lungs/Pleura: Image  quality is degraded by respiratory motion. Severe bullous emphysema. Thick-walled cavity with internal debris in the posterior left hemithorax measures approximately 5.3 x 8.5 cm (5/43) and has enlarged significantly from prior exams. Dependent atelectasis in the left lower lobe. Lingular nodule measures 7 mm (5/88), unchanged from 09/01/2017 and considered benign. Trace left pleural fluid. Airway is unremarkable. Upper Abdomen: Visualized portions of the liver, adrenal glands, kidneys, spleen, pancreas, stomach and bowel are grossly unremarkable with the exception of a small hiatal hernia. No upper abdominal adenopathy. Musculoskeletal: No worrisome lytic or sclerotic lesions. IMPRESSION: 1. Thick-walled large cavitary lesion with internal debris in the posterior left hemithorax, markedly progressive from 09/02/2018. A chronic infectious/inflammatory etiology, including mycetoma, can have this appearance. Malignancy cannot be excluded. 2. Trace left pleural effusion. 3. Significant respiratory motion degrades image quality. No findings worrisome for interstitial lung disease. 4. Aortic atherosclerosis (ICD10-I70.0). Coronary artery calcification. 5. Enlarged pulmonic trunk, indicative of pulmonary arterial hypertension. 6. Severe bullous Emphysema (ICD10-J43.9). Electronically Signed   By: Lorin Picket M.D.   On: 08/08/2019 10:04   DG Chest Port 1 View  Result Date: 08/08/2019 CLINICAL DATA:  Increasing shortness of breath with productive cough EXAM: PORTABLE CHEST 1 VIEW COMPARISON:  Radiograph 05/04/2019, CT 09/02/2018 FINDINGS: Patchy opacities present in the right lung base as well as in the left infrahilar lung and periphery of the left upper lobe. Findings are on a background of diffuse biapical scarring and severe emphysematous changes with architectural distortion most pronounced in the left upper lung, better visualized on comparison cross-sectional imaging. Cardiomediastinal contours are similar to  prior counting for differences in technique. Question some left pleural fluid tracking within the fissures. No clear pneumothorax is seen. Portion of the right costophrenic sulcus is collimated from view. No acute osseous or soft tissue abnormality. IMPRESSION: Patchy opacities in the right lung base as well as in the left infrahilar lung and periphery of the left upper lobe could reflect acute infectious consolidation. Background of severe emphysematous changes with widespread scarring and architectural distortion most pronounced in the left upper lung. Electronically Signed   By: Lovena Le M.D.   On: 08/08/2019 06:40    EKG: Independently reviewed.  No acute ischemic changes appreciated.  Assessment/Plan 1-acute on chronic respiratory failure ([place]) -With what appears to be acute on chronic COPD exacerbation -Patient with underlying history of MAI and actively receiving treatment for it and follow by ID. -no fever, normal WBC's, reported to be compliant with medications and not longer smoking. -big concerns for further progression of end stage lung disease -using 15 [name] high flow nasal cannula supplementation currently. -Patient will be started on steroids, Brovana, Pulmicort, and continue the use of Zithromax, rifampin. -Pulmonology service has been consulted and will follow recommendations. -ABG and CT scan has been ordered -Palliative care has been involved -Continue the use of Mucinex twice daily and as needed antitussive for excessive coughing spells.. -Covid test negative. -Continue Singulair. -Follow sputum culture.  2-type 2 diabetes with chronic use of insulin -Will hold oral hypoglycemic agents -Check A1c -Continue sliding scale insulin and long-acting insulin.  3-essential hypertension -Continue current antihypertensive regimen -Blood pressure stable.  4-anxiety/depression -No suicidal ideation or hallucination -Continue current use of as needed benzodiazepines and  Celexa.  5-GERD/GI prophylaxis -continue PPI.  6-history of BPH -Continue Flomax  -No urinary retention symptoms reported.  7-chronic diastolic heart failure -  Appears to be compensated currently -Continue current use of Lasix -Follow daily weights and strict I's and O's. -Heart healthy diet has been ordered.  8-hyperlipidemia -Continue statins.  9-chronic paroxysmal atrial fibrillation -No chronically on anticoagulation -Continue Cardizem -Rate controlled and currently sinus..  DVT prophylaxis: Lovenox Code Status: Full Code.  Family Communication: No family at bedside. Disposition Plan: To be determined at time of discharge once respiratory status is stabilized. Consults called: Pulmonology service, palliative care. Admission status: Stepdown, inpatient, length of stay more than 2 midnights.   Time Spent: 70 minutes  Barton Dubois MD [facility] Pager [phone]   08/08/2019, 3:56 PM

## 2019-08-08 NOTE — Progress Notes (Signed)
Initial Nutrition Assessment  DOCUMENTATION CODES:      INTERVENTION:  Glucerna Shake po BID, each supplement provides 220 kcal and 10 grams of protein   Snack qhs   NUTRITION DIAGNOSIS:   Increased nutrient needs related to chronic illness COPD-(emphysema), large cavitary lesion as evidenced by estimated needs.  GOAL:  Patient will meet greater than or equal to 90% of their needs   MONITOR:   PO intake, Supplement acceptance, Labs, Weight trends, I & O's ASSESSMENT: Patient is a 59 yo male with COPD (baseline 6 liter [place]), DM2, GERD, CHF, Barrett's esophagus. Presents short of breath, productive cough and required NRB.   Chest x-ray-emphysematous and bronchitic changes. CT-Large cavitary lesion, severe bullous emphysema Sputum afb-Rule out TB-pending Sputum fungus-pending    Very good appetite. Meal intake:100% of multiple meals. Patient complaining that he is still hungry and relates this to the steroid treatment.Discussed CHO modified options with him for increasing nutrition intake between meals. CBG's currently elevated.   Review of hospital records indicate pt weight is down 7.7% (6.5 kg) compared to 2 months ago. Patient reports usual wt of 175 lb (79.5 kg). He currently has moderate pitting edema to BLE.  Intake/Output Summary (Last 24 hours) at 08/12/2019 1229 Last data filed at 08/11/2019 2100 Gross per 24 hour  Intake 480 ml  Output 800 ml  Net -320 ml   Since admission -570 ml   Labs reviewed: 1/25-A1C-8.3% BMP Latest Ref Rng & Units 08/12/2019 08/09/2019 08/08/2019  Glucose 70 - 99 mg/dL 334(H) 256(H) -  BUN 6 - 20 mg/dL 17 21(H) -  Creatinine 0.61 - 1.24 mg/dL 1.11 1.13 0.95  BUN/Creat Ratio 6 - 22 (calc) - - -  Sodium 135 - 145 mmol/L 135 136 -  Potassium 3.5 - 5.1 mmol/L 4.2 3.9 -  Chloride 98 - 111 mmol/L 92(L) 93(L) -  CO2 22 - 32 mmol/L 33(H) 30 -  Calcium 8.9 - 10.3 mg/dL 8.3(L) 8.4(L) -     Medications reviewed and include: lasix, klor-con, insulin,  solumedrol, protonix.  NUTRITION - FOCUSED PHYSICAL EXAM: Not performed due to pending TB test.    Diet Order:   Diet Order            Diet Carb Modified Fluid consistency: Thin; Room service appropriate? Yes  Diet effective now              EDUCATION NEEDS:   Education needs have been addressed Skin:  Skin Assessment: Reviewed RN Assessment  Last BM:  1/27  Height:   Ht Readings from Last 1 Encounters:  08/08/19 5\' 8"  (1.727 m)    Weight:   Wt Readings from Last 1 Encounters:  08/10/19 78.3 kg    Ideal Body Weight:   70 kg  BMI:  Body mass index is 26.25 kg/m.  Estimated Nutritional Needs:   Kcal:  2300-2400  Protein:  101-109 gr  Fluid:  <2 liters daily   Colman Cater MS,RD,CSG,LDN Office: [phone] Pager: [phone]

## 2019-08-09 ENCOUNTER — Encounter (HOSPITAL_COMMUNITY): Payer: Self-pay | Admitting: Internal Medicine

## 2019-08-09 DIAGNOSIS — Z515 Encounter for palliative care: Secondary | ICD-10-CM

## 2019-08-09 DIAGNOSIS — J449 Chronic obstructive pulmonary disease, unspecified: Secondary | ICD-10-CM

## 2019-08-09 DIAGNOSIS — Z7189 Other specified counseling: Secondary | ICD-10-CM

## 2019-08-09 LAB — CBC
HCT: 33 % — ABNORMAL LOW (ref 39.0–52.0)
Hemoglobin: 10.1 g/dL — ABNORMAL LOW (ref 13.0–17.0)
MCH: 25.5 pg — ABNORMAL LOW (ref 26.0–34.0)
MCHC: 30.6 g/dL (ref 30.0–36.0)
MCV: 83.3 fL (ref 80.0–100.0)
Platelets: 268 10*3/uL (ref 150–400)
RBC: 3.96 MIL/uL — ABNORMAL LOW (ref 4.22–5.81)
RDW: 15.5 % (ref 11.5–15.5)
WBC: 3.9 10*3/uL — ABNORMAL LOW (ref 4.0–10.5)
nRBC: 0 % (ref 0.0–0.2)

## 2019-08-09 LAB — GLUCOSE, CAPILLARY
Glucose-Capillary: 249 mg/dL — ABNORMAL HIGH (ref 70–99)
Glucose-Capillary: 257 mg/dL — ABNORMAL HIGH (ref 70–99)
Glucose-Capillary: 361 mg/dL — ABNORMAL HIGH (ref 70–99)
Glucose-Capillary: 144 mg/dL — ABNORMAL HIGH (ref 70–99)
Glucose-Capillary: 278 mg/dL — ABNORMAL HIGH (ref 70–99)

## 2019-08-09 LAB — BASIC METABOLIC PANEL
Anion gap: 13 (ref 5–15)
BUN: 21 mg/dL — ABNORMAL HIGH (ref 6–20)
CO2: 30 mmol/L (ref 22–32)
Calcium: 8.4 mg/dL — ABNORMAL LOW (ref 8.9–10.3)
Chloride: 93 mmol/L — ABNORMAL LOW (ref 98–111)
Creatinine, Ser: 1.13 mg/dL (ref 0.61–1.24)
GFR calc Af Amer: >60 mL/min (ref >60)
GFR calc non Af Amer: >60 mL/min (ref >60)
Glucose, Bld: 256 mg/dL — ABNORMAL HIGH (ref 70–99)
Potassium: 3.9 mmol/L (ref 3.5–5.1)
Sodium: 136 mmol/L (ref 135–145)

## 2019-08-09 LAB — MRSA PCR SCREENING: MRSA by PCR: NEGATIVE

## 2019-08-09 MED ORDER — LEVALBUTEROL HCL 0.63 MG/3ML IN NEBU
0.6300 mg | INHALATION_SOLUTION | Freq: Four times a day (QID) | RESPIRATORY_TRACT | Status: DC | PRN
  Administered 2019-08-09: 0.63 mg via RESPIRATORY_TRACT
  Filled 2019-08-09: qty 3

## 2019-08-09 NOTE — Consult Note (Signed)
Consultation Note Date: 08/09/2019   Patient Name: Ryan [name]  DOB: 01/22/1961  MRN: [id number]  Age / Sex: 59 y.o., male  PCP: [name], MD Referring Physician: Roxan Hockey, MD  Reason for Consultation: Establishing goals of care and Non pain symptom management  HPI/Patient Profile: 59 y.o. male  with past medical history of COPD on home O2, heart failure, A. fib, CAD, degenerative disc disease, GERD, cavitary lesion of lung, Barrett's esophagus, smoker, type 2 diabetes admitted on 08/08/2019 with Acute on chronic hypoxic respiratory failure--- suspect due to COPD exacerbation.   Clinical Assessment and Goals of Care:  [name] is reviewed through the glass door of the ICU.  He is sitting on the edge of the bed, he appears acutely/chronically ill and frail.  He is being ruled out for TB  Call to Mr. Armijo via phone to discuss goals of care, no answer at this time.  PMT to continue to follow.   Conference with bedside nursing staff and attending related to patient condition, needs.  HCPOA    NEXT OF KIN -  Spouse Ryan [name]    SUMMARY OF RECOMMENDATIONS   At this point, full scope/full code  Continue codes status discussions  Code Status/Advance Care Planning:  Full code  Symptom Management:   Per attending, no additional needs at this time.  I believe [name] would benefit from low-dose by mouth morphine liquid 2.6 mg every 4-6 hours as needed for breathlessness.  He is agreeable, but I share the difficulties in prescribing morphine if he is continuing to desire full code/full scope.  We discussed liberalization of symptom management if he chooses hospice.  Palliative Prophylaxis:   symptom management for breathlessness   Additional Recommendations (Limitations, Scope, Preferences):  Full Scope Treatment  Psycho-social/Spiritual:   Desire for further Chaplaincy  support:no  Additional Recommendations: Caregiving  Support/Resources and Education on Hospice  Prognosis:   Unable to determine, based on outcomes. Chronic lung diease is notoriously difficult for prognostication. 6 months or less would not be surprising.   Discharge Planning: anticipate home wiht [place] as needed.       Primary Diagnoses: Present on Admission: . Acute on chronic respiratory failure ([place]) . 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  quit smoking 05/01/15  . COPD exacerbation ([employer]) . Anemia of chronic disease . MAI (mycobacterium avium-intracellulare) ([place]) . Lower extremity edema . Acute respiratory failure with hypoxia and hypercapnia (HCC)   I have reviewed the medical record, interviewed the patient and family, and examined the patient. The following aspects are pertinent.  Past Medical History:  Diagnosis Date  . Atrial fibrillation ([place])    Not anticoagulated  . Barrett's esophagus   . Borderline diabetes   . Bronchitis   . CAP (community acquired pneumonia) 07/10/2013   04/2015  . 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 Chronic back pain   . Chronic diastolic CHF (congestive heart failure) ([place])   . Chronic left shoulder pain   .  Chronic neck pain   . Chronic respiratory failure ([place])   . Collagen vascular disease ([place])   . COPD (chronic obstructive pulmonary disease) ([place])   . Coronary atherosclerosis of native coronary artery    Mild nonobstructive disease at catheterization 2007  . DDD (degenerative disc disease)    Cervical and thoracic  . Diastolic heart failure ([place])   . GERD (gastroesophageal reflux disease)   . History of pneumonia   . Hypercholesteremia   . On home O2    6L N/C  . Polycythemia   . Seizures ([place])    Last seizure 2 yrs ago  . Smoker   . Type 2 diabetes mellitus ([place])   . Type 2 diabetes mellitus without complication (HCC)    Social History   Socioeconomic History  . Marital status: Married     Spouse name: Ryan [name]  . Number of children: 4  . Years of education: 10  . Highest education level: Not on file  Occupational History  . Not on file  Tobacco Use  . Smoking status: Former Smoker    Packs/day: 0.50    Years: 45.00    Pack years: 22.50    Types: Cigarettes    Quit date: 05/01/2015    Years since quitting: 4.2  . Smokeless tobacco: Never Used  Substance and Sexual Activity  . Alcohol use: No    Alcohol/week: 0.0 standard drinks  . Drug use: No  . Sexual activity: Yes    Birth control/protection: Surgical  Other Topics Concern  . Not on file  Social History Narrative   Patient is married Ryan [name]) and lives at home with his wife and one child.   Patient has four children.   Patient is disabled.   Patient has a high school education.   Patient is left-handed.   Patient does not drink any caffeine.   Social Determinants of Health   Financial Resource Strain:   . Difficulty of Paying Living Expenses: Not on file  Food Insecurity:   . Worried About Charity fundraiser in the Last Year: Not on file  . Ran Out of Food in the Last Year: Not on file  Transportation Needs:   . Lack of Transportation (Medical): Not on file  . Lack of Transportation (Non-Medical): Not on file  Physical Activity:   . Days of Exercise per Week: Not on file  . Minutes of Exercise per Session: Not on file  Stress:   . Feeling of Stress : Not on file  Social Connections:   . Frequency of Communication with Friends and Family: Not on file  . Frequency of Social Gatherings with Friends and Family: Not on file  . Attends Religious Services: Not on file  . Active Member of Clubs or Organizations: Not on file  . Attends Archivist Meetings: Not on file  . Marital Status: Not on file   Family History  Problem Relation Age of Onset  . Hypertension Mother   . Diabetes Mother   . Heart attack Mother   . Heart failure Mother   . Hypertension Sister   . Diabetes Sister   .  Heart failure Sister   . Colon cancer Neg Hx    Scheduled Meds: . arformoterol  15 mcg Nebulization BID  . azithromycin  500 mg Oral Daily  . benzonatate  200 mg Oral TID  . budesonide (PULMICORT) nebulizer solution  0.5 mg Nebulization BID  . Chlorhexidine Gluconate Cloth  6 each Topical Q0600  .  citalopram  20 mg Oral Daily  . Clofazimine  100 mg Does not apply Daily  . diltiazem  120 mg Oral Daily  . enoxaparin (LOVENOX) injection  40 mg Subcutaneous Q24H  . furosemide  40 mg Oral BID  . guaiFENesin  1,200 mg Oral BID  . insulin aspart  0-15 Units Subcutaneous TID WC  . insulin aspart  0-5 Units Subcutaneous QHS  . insulin glargine  40 Units Subcutaneous QHS  . ipratropium  0.5 mg Nebulization Q6H  . levalbuterol  0.63 mg Nebulization Q6H  . loratadine  10 mg Oral Daily  . mouth rinse  15 mL Mouth Rinse BID  . methylPREDNISolone (SOLU-MEDROL) injection  60 mg Intravenous Q8H  . montelukast  10 mg Oral Daily  . pantoprazole  40 mg Oral BID AC  . potassium chloride SA  40 mEq Oral Daily  . rifampin  600 mg Oral Daily  . rosuvastatin  10 mg Oral q morning - 10a  . sodium chloride flush  3 mL Intravenous Q12H  . tamsulosin  0.4 mg Oral BID   Continuous Infusions: . sodium chloride     PRN Meds:.sodium chloride, acetaminophen, fluticasone, levalbuterol, LORazepam, oxyCODONE-acetaminophen **AND** oxyCODONE, phenol, sodium chloride flush Medications Prior to Admission:  Prior to Admission medications   Medication Sig Start Date End Date Taking? Authorizing Provider  alfuzosin (UROXATRAL) 10 MG 24 hr tablet Take 10 mg by mouth daily with breakfast.   Yes [provider]  AMBULATORY NON FORMULARY MEDICATION Take 100 mg by mouth daily. Medication Name: Clofazimine 06/07/19  Yes Kuppelweiser, Cassie L, RPH-CPP  azithromycin (ZITHROMAX) 500 MG tablet 1 Tab daily 07/19/19  Yes Comer, Okey Regal, MD  BD PEN NEEDLE NANO 2ND GEN 32G X 4 MM MISC INJECT UP TO 5 TIMES DAILY 06/29/19   Yes [provider]  benzonatate (TESSALON) 200 MG capsule Take 200 mg by mouth 3 (three) times daily.   Yes [provider]  budesonide (PULMICORT) 0.25 MG/2ML nebulizer solution Take 2 mLs (0.25 mg total) by nebulization 2 (two) times daily. Patient taking differently: Take 0.25 mg by nebulization 4 (four) times daily.  10/24/17  Yes [name], MD  CARTIA XT 120 MG 24 hr capsule Take 1 capsule (120 mg total) by mouth daily. 01/01/18  Yes Branch, Alphonse Guild, MD  citalopram (CELEXA) 20 MG tablet Take 1 tablet (20 mg total) by mouth daily. 05/30/19  Yes [name], MD  diphenhydrAMINE (BENADRYL) 25 mg capsule Take 25 mg by mouth every 8 (eight) hours as needed. Felt mouth swelling at ED visit 08/16/18.   Yes [provider]  fexofenadine (ALLEGRA) 180 MG tablet Take 180 mg by mouth daily.   Yes [provider]  fluticasone (FLONASE) 50 MCG/ACT nasal spray Place 2 sprays into both nostrils daily as needed.  02/24/18  Yes [provider]  Fluticasone-Umeclidin-Vilant (TRELEGY ELLIPTA) 100-62.5-25 MCG/INH AEPB Inhale 1 puff into the lungs daily.   Yes [provider]  furosemide (LASIX) 40 MG tablet Take 1 tablet (40 mg total) by mouth 2 (two) times daily. As needed for swelling 05/29/19 05/28/20 Yes [name], MD  guaiFENesin (MUCINEX) 600 MG 12 hr tablet Take 600 mg by mouth 2 (two) times daily.   Yes [provider]  HYDROcodone-homatropine (HYCODAN) 5-1.5 MG/5ML syrup Take 5 mLs by mouth every 6 (six) hours as needed for cough.   Yes [provider]  insulin glargine (LANTUS) 100 UNIT/ML injection Inject 0.6 mLs (60 Units total) into  the skin daily. 24 units daily Patient taking differently: Inject 58 Units into the skin daily. 24 units daily 05/29/19  Yes [name], MD  insulin lispro (HUMALOG) 100 UNIT/ML injection Take by sliding scale provided by hospital maximum amount daily is 60 units.  Check blood sugar 4  times a day Patient taking differently: Inject 0-15 Units into the skin 3 (three) times daily with meals. Take by sliding scale provided by hospital maximum amount daily is 60 units.  Check blood sugar 4 times a day 09/23/17  Yes [name], MD  ipratropium (ATROVENT) 0.02 % nebulizer solution Take 0.5 mg by nebulization every 6 (six) hours as needed.  09/29/17  Yes [provider]  levalbuterol Penne Lash) 0.63 MG/3ML nebulizer solution Take 0.63 mg by nebulization every 6 (six) hours as needed.    Yes [provider]  levofloxacin (LEVAQUIN) 500 MG tablet Take 500 mg by mouth daily. Started levaquin on 08/05/2019. Spouse stated dr Luan Pulling gave patient multiple prescriptions for this before he retired. He is not on this by doctors orders. When asked spouse why he is taking antibiotic, she states it is to make him feel better. She didn't give any symptoms to why he is taking it. 07/07/19  Yes [provider]  LORazepam (ATIVAN) 0.5 MG tablet Take 1 tablet (0.5 mg total) by mouth 2 (two) times daily as needed for anxiety or sleep. Patient taking differently: Take 0.5 mg by mouth 4 (four) times daily.  05/04/15  Yes Mikhail, [place], DO  metFORMIN (GLUCOPHAGE) 500 MG tablet Take 1 tablet (500 mg total) by mouth daily with breakfast. 09/15/14  Yes Rexene Alberts, MD  methocarbamol (ROBAXIN) 500 MG tablet Take 500 mg by mouth 2 (two) times daily. 06/24/19  Yes [provider]  montelukast (SINGULAIR) 10 MG tablet Take 1 tablet by mouth daily. 02/17/19  Yes [provider]  nitroGLYCERIN (NITROSTAT) 0.4 MG SL tablet Place 1 tablet (0.4 mg total) under the tongue every 5 (five) minutes as needed for chest pain. 04/22/13  Yes Lendon Colonel, NP  omeprazole (PRILOSEC) 20 MG capsule Take 1 capsule (20 mg total) by mouth 2 (two) times daily before a meal. 08/04/18  Yes Annitta Needs, NP  oxyCODONE-acetaminophen (PERCOCET) 10-325 MG tablet Take 1 tablet by mouth every 4  (four) hours as needed for pain.   Yes [provider]  oxymetazoline (AFRIN) 0.05 % nasal spray Place 1 spray into both nostrils 2 (two) times daily.   Yes [provider]  potassium chloride SA (K-DUR,KLOR-CON) 20 MEQ tablet Take 1 tablet twice daily while on Lasix 09/10/18  Yes [name], MD  predniSONE (DELTASONE) 10 MG tablet Take 2 daily for 1 week then 1 daily Patient taking differently: Take 20 mg by mouth daily with breakfast. Takes 2 tablets daily. 10/24/17  Yes [name], MD  rifampin (RIFADIN) 300 MG capsule Take 2 capsules (600 mg total) by mouth daily. Long-term medication 07/19/19  Yes Comer, Okey Regal, MD  rosuvastatin (CRESTOR) 10 MG tablet Take 10 mg by mouth every morning.  04/10/18  Yes [provider]  SYMBICORT 160-4.5 MCG/ACT inhaler INL 2 PFS PO BID 09/23/18  Yes [provider]  tamsulosin (FLOMAX) 0.4 MG CAPS capsule Take 1 capsule (0.4 mg total) by mouth 2 (two) times daily. 09/19/18  Yes Roxan Hockey, MD  acetaminophen (TYLENOL) 325 MG tablet Take 2 tablets (650 mg total) by mouth every 6 (six) hours as needed for mild pain, moderate pain, fever  or headache (or Fever >/= 101). Patient not taking: Reported on 08/08/2019 09/19/18   Roxan Hockey, MD   Allergies  Allergen Reactions  . Albuterol Palpitations    Causes pt to have a-fib   . 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 Influenza Vaccine Live Swelling   Review of Systems  Unable to perform ROS: Acuity of condition    Physical Exam Vitals and nursing note reviewed.  Constitutional:      General: He is not in acute distress.    Appearance: He is ill-appearing.  Cardiovascular:     Rate and Rhythm: Normal rate.  Pulmonary:     Effort: Tachypnea present. No accessory muscle usage.  Skin:    General: Skin is warm and dry.  Neurological:     Mental Status: He is alert and oriented to person,  place, and time.     Vital Signs: BP 115/66   Pulse 79   Temp 97.8 F (36.6 C) (Oral)   Resp (!) 22   Ht 5\' 8"  (1.727 m)   Wt 79 kg   SpO2 99%   BMI 26.48 kg/m  Pain Scale: 0-10 POSS *See Group Information*: 1-Acceptable,Awake and alert Pain Score: 3    SpO2: SpO2: 99 % O2 Device:SpO2: 99 % O2 Flow Rate: .O2 Flow Rate (L/min): 15 L/min  IO: Intake/output summary:   Intake/Output Summary (Last 24 hours) at 08/09/2019 1407 Last data filed at 08/09/2019 0800 Gross per 24 hour  Intake 360 ml  Output 50 ml  Net 310 ml    LBM:   Baseline Weight: Weight: 79.4 kg Most recent weight: Weight: 79 kg     Palliative Assessment/Data:   Flowsheet Rows     Most Recent Value  Intake Tab  Referral Department  Hospitalist  Unit at Time of Referral  Intermediate Care Unit  Palliative Care Primary Diagnosis  Pulmonary  Date Notified  08/08/19  Palliative Care Type  Return patient Palliative Care  Reason for referral  Clarify Goals of Care, Non-pain Symptom  Date of Admission  08/08/19  Date first seen by Palliative Care  08/09/19  # of days Palliative referral response time  1 Day(s)  # of days IP prior to Palliative referral  0  Clinical Assessment  Palliative Performance Scale Score  50%  Pain Max last 24 hours  Not able to report  Pain Min Last 24 hours  Not able to report  Dyspnea Max Last 24 Hours  Not able to report  Dyspnea Min Last 24 hours  Not able to report  Psychosocial & Spiritual Assessment  Palliative Care Outcomes      Time In: 1505 Time Out: 1535 Time Total: 30 minutes  Greater than 50%  of this time was spent counseling and coordinating care related to the above assessment and plan.  Signed by: Drue Novel, NP   Please contact Palliative Medicine Team phone at [phone] for questions and concerns.  For individual provider: See Shea Evans

## 2019-08-09 NOTE — Plan of Care (Signed)
  Problem: Acute Rehab PT Goals(only PT should resolve) Goal: Patient Will Transfer Sit To/From Stand Outcome: Progressing Flowsheets (Taken 08/09/2019 [phone]) Patient will transfer sit to/from stand: with modified independence Goal: Pt Will Transfer Bed To Chair/Chair To Bed Outcome: Progressing Flowsheets (Taken 08/09/2019 0824) Pt will Transfer Bed to Chair/Chair to Bed: with modified independence Goal: Pt Will Ambulate Outcome: Progressing Flowsheets (Taken 08/09/2019 0824) Pt will Ambulate:  25 feet  with modified independence  with cane  with rolling walker   8:26 AM, 08/09/19 Lonell Grandchild, MPT Physical Therapist with [facility] [phone] office [phone] mobile phone

## 2019-08-09 NOTE — Progress Notes (Signed)
Patient Demographics:    Ryan [name], is a 59 y.o. male, DOB - 02-19-1961, RCB:[id number]  Admit date - 08/08/2019   Admitting Physician Barton Dubois, MD  Outpatient Primary MD for the patient is [name], MD  LOS - 1   Chief Complaint  Patient presents with  . 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 male with past medical history relevant for chronic hypoxic respiratory failure on 6 L of oxygen at home, DM2, HTN, COPD, HFpEF/dCHF, HLD and GERD/Barret's, PAFib, MAI infection (chronically followed by ID and receiving antibiotics admitted on 08/08/2019 with worsening shortness of breath and cough, -worsening hypoxia, worsening bilateral lower extremity edema, dyspnea at rest and dyspnea on exertion--- found to have abnormal CT chest -Covid19 negative  A/P 1)Acute on chronic hypoxic respiratory failure--- suspect due to COPD exacerbation---pulm consult appreciated  -At baseline patient uses 6 L of nasal cannula  On admission was requiring high flow nasal cannula, -Continue to wean down oxygen to his baseline -Continue bronchodilators (level of albuterol preferred due to propensity towards tachycardia and mucolytics -IV Solu-Medrol 60 mg every 8 hours -Patient's hypoxia is not new but his hypercapnia appears to be new  2)HFpEF--last known EF 55 to 60%,  -Appears  euvolemic and compensated at this time -Continue Lasix 40 mg twice daily  3)H/o MAI with  abnormal CT chest finding---Thick-walled large cavitary lesion with internal debris in the posterior left hemithorax, markedly progressive from 09/02/2018. --- Differential diagnosis includes chronic infectious/inflammatory etiology, including mycetoma, can have this appearance. Malignancy cannot be excluded. -May be due to underlying MAI-- Cannot rule out ILD or malignancy or aspergilloma/ mycetoma -Sputum for AFB, Quant gold TB and sputum for fungus requested -Pulmonology consult appreciated -Continue azithromycin 500 mg daily and rifampin 600 mg daily  4)PAFib--- PTA was not on anticoagulation, tends to have RVR with bronchodilators -Continue Cardizem CD 120 mg daily for rate control   5)DM2-A1c is 8.3 reflecting uncontrolled diabetes, anticipate worsening hyperglycemia with steroids, Lantus insulin 40 units daily along with sliding scale NovoLog  6)Mild Anemia--- hemoglobin is 10.1, asymptomatic , s -Continue Protonix -Monitor H&H if H&H continues to drop consider inpatient GI consult otherwise deferred to outpatient as patient current cardiopulmonary status precludes endoluminal evaluation at this time  7)BPH --- stable, continue Flomax  8)Anxiety/Depression-stable, continue Celexa and lorazepam  9)GERD/Barrett's --- continue Protonix 40 mg twice daily  10) social/ethics--palliative care consult requested currently full code  Disposition/Need for in-Hospital Stay- patient unable to be discharged at this time due to --- acute on chronic hypoxic respiratory failure requiring further stabilization--becomes very tachycardic and dyspneic with minimal activity  with worsening Hypoxia  Code Status : Full  Family Communication:   NA (patient is alert, awake and coherent)   Disposition Plan  : TBD after improvement in respiratory status  Consults  : Pulmonology  DVT Prophylaxis  :  Lovenox - -  SCDs   Lab Results  Component Value Date   PLT 268 08/09/2019    Inpatient Medications  Scheduled Meds: . arformoterol  15 mcg Nebulization BID  . azithromycin  500 mg Oral Daily  . benzonatate  200 mg Oral TID  . budesonide (PULMICORT) nebulizer solution  0.5 mg Nebulization BID  . Chlorhexidine Gluconate Cloth  6 each Topical Q0600  . citalopram  20 mg Oral Daily  . Clofazimine  100 mg Does not apply Daily  . diltiazem  120 mg Oral Daily  . enoxaparin (LOVENOX) injection  40 mg Subcutaneous Q24H  . furosemide  40 mg Oral BID  . guaiFENesin  1,200 mg Oral BID  . insulin aspart  0-15 Units Subcutaneous TID WC  . insulin aspart  0-5 Units Subcutaneous QHS  . insulin glargine  40 Units Subcutaneous QHS  . ipratropium  0.5 mg Nebulization Q6H  . levalbuterol  0.63 mg Nebulization Q6H  . loratadine  10 mg Oral Daily  . mouth rinse  15 mL Mouth Rinse BID  . methylPREDNISolone (SOLU-MEDROL) injection  60 mg Intravenous Q8H  . montelukast  10 mg Oral Daily  . pantoprazole  40 mg Oral BID AC  . potassium chloride SA  40 mEq Oral Daily  . rifampin  600 mg Oral Daily  . rosuvastatin  10 mg Oral q morning - 10a  . sodium chloride flush  3 mL Intravenous Q12H  . tamsulosin  0.4 mg Oral BID   Continuous Infusions: . sodium chloride     PRN Meds:.sodium chloride, acetaminophen, fluticasone, levalbuterol, LORazepam, oxyCODONE-acetaminophen **AND** oxyCODONE, phenol, sodium chloride flush    Anti-infectives (From admission, onward)   Start     Dose/Rate Route Frequency Ordered Stop   08/08/19 1200  azithromycin (ZITHROMAX) tablet 500 mg    Note to Pharmacy: 1 Tab daily     500 mg Oral Daily 08/08/19 0825     08/08/19 1200  rifampin (RIFADIN) capsule 600 mg    Note to Pharmacy: Long-term medication     600 mg Oral Daily 08/08/19 0825     08/08/19 1000  levofloxacin (LEVAQUIN) tablet 500 mg  Status:  Discontinued     500 mg Oral Daily 08/08/19 0825 08/08/19 1156   08/08/19 0715   azithromycin (ZITHROMAX) 500 mg in sodium chloride 0.9 % 250 mL IVPB  Status:  Discontinued     500 mg 250 mL/hr over 60 Minutes Intravenous Every 24 hours 08/08/19 0706 08/08/19 0828   08/08/19 0715  cefTRIAXone (ROCEPHIN) 1 g in sodium chloride 0.9 % 100 mL IVPB     1 g 200 mL/hr over 30 Minutes Intravenous  Once 08/08/19 0706 08/08/19 0836        Objective:   Vitals:   08/09/19 1200 08/09/19 1300 08/09/19 1400 08/09/19 1503  BP: 125/72     Pulse: 98 (!) 117 93   Resp: (!) 22 (!) 24 (!) 24   Temp:      TempSrc:      SpO2: (!) 88% (!) 74% 96% 100%  Weight:      Height:        Wt Readings from Last 3 Encounters:  08/09/19 79 kg  05/29/19 84.8 kg  01/10/19 85.3 kg     Intake/Output Summary (Last 24 hours) at 08/09/2019 1559 Last data filed at 08/09/2019 0800 Gross per 24 hour  Intake 360 ml  Output 50 ml  Net 310 ml     Physical Exam  Gen:- Awake Alert, some conversational dyspnea HEENT:- [place].AT, No sclera icterus Nose- [place] 6L/min Neck-Supple Neck,No JVD,.  Lungs-diminished in bases, scattered wheezing  CV- S1, S2 normal, irregularly irregular becomes very tachycardic with minimal activity  abd-  +ve B.Sounds, Abd Soft, No tenderness,    Extremity/Skin:-Chronic trace to +1 edema, pedal pulses present  Psych-affect is appropriate, oriented x3 Neuro-no new focal deficits, no tremors   Data Review:   Micro Results Recent Results (from the past 240 hour(s))  Respiratory Panel by RT PCR (Flu A&B, Covid) - Nasopharyngeal Swab     Status: None   Collection Time: 08/08/19  6:16 AM   Specimen: Nasopharyngeal Swab  Result Value Ref Range Status   SARS Coronavirus 2 by RT PCR NEGATIVE NEGATIVE Final    Comment: (NOTE) SARS-CoV-2 target nucleic acids are NOT DETECTED. The SARS-CoV-2 RNA is generally detectable in upper respiratoy specimens during the acute phase of infection. The lowest concentration of SARS-CoV-2 viral copies this assay can detect is 131 copies/mL. A  negative result does not preclude SARS-Cov-2 infection and should not be used as the sole basis for treatment or other patient management decisions. A negative result may occur with  improper specimen collection/handling, submission of specimen other than nasopharyngeal swab, presence of viral mutation(s) within the areas targeted by this assay, and inadequate number of viral copies (<131 copies/mL). A negative result must be combined with clinical observations, patient history, and epidemiological information. The expected result is Negative. Fact Sheet for Patients:  PinkCheek.be Fact Sheet for Healthcare Providers:  GravelBags.it This test is not yet ap proved or cleared by the [place] FDA and  has been authorized for detection and/or diagnosis of SARS-CoV-2 by FDA under an Emergency Use Authorization (EUA). This EUA will remain  in effect (meaning this test can be used) for the duration of the COVID-19 declaration under Section 564(b)(1) of the Act, 21 U.S.C. section 360bbb-3(b)(1), unless the authorization is terminated or revoked sooner.    Influenza A by PCR NEGATIVE NEGATIVE Final   Influenza B by PCR NEGATIVE NEGATIVE Final    Comment: (NOTE) The Xpert Xpress SARS-CoV-2/FLU/RSV assay is intended as an aid in  the diagnosis of influenza from Nasopharyngeal swab specimens and  should not be used as a sole basis for treatment. Nasal washings and  aspirates are unacceptable for Xpert Xpress SARS-CoV-2/FLU/RSV  testing. Fact Sheet for Patients: PinkCheek.be Fact Sheet for Healthcare Providers: GravelBags.it This test is not yet approved or cleared by the [place] FDA and  has been authorized for detection and/or diagnosis of SARS-CoV-2 by  FDA under an Emergency Use Authorization (EUA). This EUA will remain  in effect (meaning this test can be used) for  the duration of the  Covid-19 declaration under Section 564(b)(1) of the Act, 21  U.S.C. section 360bbb-3(b)(1), unless the authorization is  terminated or revoked. Performed at [facility], [address] [place], [place] [address]   Culture, sputum-assessment     Status: None   Collection Time: 08/08/19  8:17 AM   Specimen: Expectorated Sputum  Result Value Ref Range Status   Specimen Description EXPECTORATED SPUTUM  Final   Special Requests NONE  Final   Sputum evaluation   Final  THIS SPECIMEN IS ACCEPTABLE FOR SPUTUM CULTURE Performed at [facility], [address], [place], [place] [address]    Report Status 08/08/2019 FINAL  Final  Culture, respiratory     Status: None (Preliminary result)   Collection Time: 08/08/19  8:17 AM  Result Value Ref Range Status   Specimen Description   Final    EXPECTORATED SPUTUM Performed at [facility], [address], [place], [place] [address]    Special Requests   Final    NONE Reflexed from [id number] Performed at [facility], [address] [place], [place] [address]    Gram Stain   Final    MODERATE WBC PRESENT,BOTH PMN AND MONONUCLEAR RARE GRAM POSITIVE COCCI IN CLUSTERS    Culture   Final    CULTURE REINCUBATED FOR BETTER GROWTH Performed at [facility], [place] [address], [place], [place] [address]    Report Status PENDING  Incomplete  MRSA PCR Screening     Status: None   Collection Time: 08/08/19  4:48 PM   Specimen: Nasopharyngeal  Result Value Ref Range Status   MRSA by PCR NEGATIVE NEGATIVE Final    Comment:        The GeneXpert MRSA Assay (FDA approved for NASAL specimens only), is one component of a comprehensive MRSA colonization surveillance program. It is not intended to diagnose MRSA infection nor to guide or monitor treatment for MRSA infections. Performed at [facility], [address], [place], [place] [address]     Radiology Reports CT Chest High Resolution  Result Date: 08/08/2019 CLINICAL  DATA:  Shortness of breath, productive cough. EXAM: CT CHEST WITHOUT CONTRAST TECHNIQUE: Multidetector CT imaging of the chest was performed following the standard protocol without intravenous contrast. High resolution imaging of the lungs, as well as inspiratory and expiratory imaging, was performed. COMPARISON:  09/02/2018, 09/01/2017 and 11/28/2015. FINDINGS: Cardiovascular: Atherosclerotic calcification of the aorta and coronary arteries. Pulmonic trunk is enlarged. Heart size normal. No pericardial effusion. Mediastinum/Nodes: No pathologically enlarged mediastinal or axillary lymph nodes. Hilar regions are difficult to evaluate without IV contrast. Esophagus is unremarkable. Lungs/Pleura: Image quality is degraded by respiratory motion. Severe bullous emphysema. Thick-walled cavity with internal debris in the posterior left hemithorax measures approximately 5.3 x 8.5 cm (5/43) and has enlarged significantly from prior exams. Dependent atelectasis in the left lower lobe. Lingular nodule measures 7 mm (5/88), unchanged from 09/01/2017 and considered benign. Trace left pleural fluid. Airway is unremarkable. Upper Abdomen: Visualized portions of the liver, adrenal glands, kidneys, spleen, pancreas, stomach and bowel are grossly unremarkable with the exception of a small hiatal hernia. No upper abdominal adenopathy. Musculoskeletal: No worrisome lytic or sclerotic lesions. IMPRESSION: 1. Thick-walled large cavitary lesion with internal debris in the posterior left hemithorax, markedly progressive from 09/02/2018. A chronic infectious/inflammatory etiology, including mycetoma, can have this appearance. Malignancy cannot be excluded. 2. Trace left pleural effusion. 3. Significant respiratory motion degrades image quality. No findings worrisome for interstitial lung disease. 4. Aortic atherosclerosis (ICD10-I70.0). Coronary artery calcification. 5. Enlarged pulmonic trunk, indicative of pulmonary arterial  hypertension. 6. Severe bullous Emphysema (ICD10-J43.9). Electronically Signed   By: Lorin Picket M.D.   On: 08/08/2019 10:04   US Venous Img Lower Bilateral (DVT)  Result Date: 08/08/2019 CLINICAL DATA:  Leg swelling x1 month. EXAM: BILATERAL LOWER EXTREMITY VENOUS DOPPLER ULTRASOUND TECHNIQUE: Gray-scale sonography with compression, as well as color and duplex ultrasound, were performed to evaluate the deep venous system(s) from the level of the common femoral vein  through the popliteal and proximal calf veins. COMPARISON:  None. FINDINGS: VENOUS Normal compressibility of the common femoral, superficial femoral, and popliteal veins, as well as the visualized calf veins. Visualized portions of profunda femoral vein and great saphenous vein unremarkable. No filling defects to suggest DVT on grayscale or color Doppler imaging. Doppler waveforms show normal direction of venous flow, normal respiratory phasicity and response to augmentation. Limited views of the contralateral common femoral vein are unremarkable. OTHER None. Limitations: none IMPRESSION: No femoropopliteal DVT nor evidence of DVT within the visualized calf veins. If clinical symptoms are inconsistent or if there are persistent or worsening symptoms, further imaging (possibly involving the iliac veins) may be warranted. Electronically Signed   By: Virgina Norfolk M.D.   On: 08/08/2019 17:15   DG Chest Port 1 View  Result Date: 08/08/2019 CLINICAL DATA:  Increasing shortness of breath with productive cough EXAM: PORTABLE CHEST 1 VIEW COMPARISON:  Radiograph 05/04/2019, CT 09/02/2018 FINDINGS: Patchy opacities present in the right lung base as well as in the left infrahilar lung and periphery of the left upper lobe. Findings are on a background of diffuse biapical scarring and severe emphysematous changes with architectural distortion most pronounced in the left upper lung, better visualized on comparison cross-sectional imaging.  Cardiomediastinal contours are similar to prior counting for differences in technique. Question some left pleural fluid tracking within the fissures. No clear pneumothorax is seen. Portion of the right costophrenic sulcus is collimated from view. No acute osseous or soft tissue abnormality. IMPRESSION: Patchy opacities in the right lung base as well as in the left infrahilar lung and periphery of the left upper lobe could reflect acute infectious consolidation. Background of severe emphysematous changes with widespread scarring and architectural distortion most pronounced in the left upper lung. Electronically Signed   By: Lovena Le M.D.   On: 08/08/2019 06:40     CBC Recent Labs  Lab 08/08/19 0614 08/08/19 0828 08/09/19 0420  WBC 5.8 5.4 3.9*  HGB 9.9* 9.8* 10.1*  HCT 32.0* 31.8* 33.0*  PLT 243 238 268  MCV 82.7 83.0 83.3  MCH 25.6* 25.6* 25.5*  MCHC 30.9 30.8 30.6  RDW 15.6* 15.7* 15.5    Chemistries  Recent Labs  Lab 08/08/19 0614 08/08/19 0828 08/09/19 0420  NA 134*  --  136  K 4.1  --  3.9  CL 93*  --  93*  CO2 28  --  30  GLUCOSE 141*  --  256*  BUN 12  --  21*  CREATININE 0.96 0.95 1.13  CALCIUM 8.4*  --  8.4*  AST 24  --   --   ALT 24  --   --   ALKPHOS 60  --   --   BILITOT 0.5  --   --    ------------------------------------------------------------------------------------------------------------------ No results for input(s): CHOL, HDL, LDLCALC, TRIG, CHOLHDL, LDLDIRECT in the last 72 hours.  Lab Results  Component Value Date   HGBA1C 8.3 (H) 08/08/2019   ------------------------------------------------------------------------------------------------------------------ No results for input(s): TSH, T4TOTAL, T3FREE, THYROIDAB in the last 72 hours.  Invalid input(s): FREET3 ------------------------------------------------------------------------------------------------------------------ No results for input(s): VITAMINB12, FOLATE, FERRITIN, TIBC, IRON,  RETICCTPCT in the last 72 hours.  Coagulation profile No results for input(s): INR, PROTIME in the last 168 hours.  No results for input(s): DDIMER in the last 72 hours.  Cardiac Enzymes No results for input(s): CKMB, TROPONINI, MYOGLOBIN in the last 168 hours.  Invalid input(s): CK ------------------------------------------------------------------------------------------------------------------    Component Value Date/Time  BNP 63.0 08/08/2019 9861     Roxan Hockey M.D on 08/09/2019 at 3:59 PM  Go to [email or web address] - for contact info  [facility] - Office  [phone]

## 2019-08-09 NOTE — Progress Notes (Signed)
PULMONARY / CRITICAL CARE MEDICINE   NAME:  Ryan [name], MRN:  [id number], DOB:  1961-02-05, LOS: 1 ADMISSION DATE:  08/08/2019, CONSULTATION DATE:  1/25 REFERRING MD:  Triad , CHIEF COMPLAINT:  Sob   BRIEF HISTORY:    58 yowm seen in 2016 in pulmonary clinic by [place] with GOLD II copd spirometry /02 dep  then @ 3lpm NP and followed by Luan Pulling since then on floor of pred 10 mg daily and prn levaquin/ pred 40 for flares  with Dx MAI followed by [place]  Admit 1/25 with one week h/o progressive resting sob/ cough congestion in resp distress on arrival  and pccm service asked to evaluate  By Triad pm 1/25             SIGNIFICANT PAST MEDICAL HISTORY   COPD  Documented  Severe GOLD II / borderline III  quit smoking 04/2015 PFT's  03/02/08  FEV1 1.78 (52 % ) ratio 39  p no % improvement from saba with DLCO  46 % corrects to 42 % for alv volume      SIGNIFICANT EVENTS:    STUDIES:   Venous dopplers 1/25 > neg bilaterally    CULTURES:  Sputum 1/25  Mod wbc, rare GPCs in clusters>>> MRSA pcr  1/25  Neg Covid PCR 1/25 neg  Sputum AFB 1/25 >>> Sputum fungus 1/25 >>>   ANTIBIOTICS:  Rocephin 1/25 only  Zpak/rifampin chronically continued 1/25 >>>   LINES/TUBES:    CONSULTANTS:  PCCM 1/25  Scheduled Meds: . arformoterol  15 mcg Nebulization BID  . azithromycin  500 mg Oral Daily  . benzonatate  200 mg Oral TID  . budesonide (PULMICORT) nebulizer solution  0.5 mg Nebulization BID  . Chlorhexidine Gluconate Cloth  6 each Topical Q0600  . citalopram  20 mg Oral Daily  . Clofazimine  100 mg Does not apply Daily  . diltiazem  120 mg Oral Daily  . enoxaparin (LOVENOX) injection  40 mg Subcutaneous Q24H  . furosemide  40 mg Oral BID  . guaiFENesin  1,200 mg Oral BID  . insulin aspart  0-15 Units Subcutaneous TID WC  . insulin aspart  0-5 Units Subcutaneous QHS  . insulin glargine  40 Units Subcutaneous QHS  . ipratropium  0.5 mg Nebulization Q6H  . levalbuterol  0.63 mg Nebulization  Q6H  . loratadine  10 mg Oral Daily  . mouth rinse  15 mL Mouth Rinse BID  . methylPREDNISolone (SOLU-MEDROL) injection  60 mg Intravenous Q8H  . montelukast  10 mg Oral Daily  . pantoprazole  40 mg Oral BID AC  . potassium chloride SA  40 mEq Oral Daily  . rifampin  600 mg Oral Daily  . rosuvastatin  10 mg Oral q morning - 10a  . sodium chloride flush  3 mL Intravenous Q12H  . tamsulosin  0.4 mg Oral BID   Continuous Infusions: . sodium chloride     PRN Meds:.sodium chloride, acetaminophen, fluticasone, levalbuterol, LORazepam, oxyCODONE-acetaminophen **AND** oxyCODONE, phenol, sodium chloride flush   SUBJECTIVE:      CONSTITUTIONAL: BP 125/72   Pulse 93   Temp 97.8 F (36.6 C) (Oral)   Resp (!) 24   Ht 5\' 8"  (1.727 m)   Wt 79 kg   SpO2 96%   BMI 26.48 kg/m  on 8 lpm NP   I/O last 3 completed shifts: In: -  Out: 50 [Urine:50]        PHYSICAL EXAM:  Hoarse wm sitting  on side of bed c/o "hurting all over"   HEENT : pt wearing mask not removed for exam due to covid -19 concerns.    NECK :  without JVD/Nodes/TM/ nl carotid upstrokes bilaterally   LUNGS: no acc muscle use,  Mod barrel  contour chest wall with bilateral  Distant bs s audible wheeze and  without cough on insp or exp maneuvers and mod  Hyperresonant  to  percussion bilaterally     CV:  RRR  no s3 or murmur or increase in P2, and no edema   ABD:  soft and nontender with pos mid insp Hoover's  in the supine position. No bruits or organomegaly appreciated, bowel sounds nl  MS:     ext warm without deformities, calf tenderness, cyanosis  - Moderate clubbing No obvious joint restrictions   SKIN: warm and dry without lesions    NEURO:  alert, approp, nl sensorium with  no motor or cerebellar deficits apparent.            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 airflow on prior spirometry by hx / 02 dep/steroid dep so techically GOLD IV with MMRC = 3 on  "best days" >>> rx duobneb/steroids/ ppi / flutter and mucinex dm  >>> started End of life discussions with pt/ agree with palliative care consultation and note is listed as DNR previously  >>> discussed "fork in the road" in his care with the following caveat:  Though somewhat paradoxic, when the lung fails to clear C02 properly and pC02 rises the lung then becomes a more efficient scavenger of C02 allowing lower work of breathing and  better C02 clearance albeit at a higher serum pC02 level - this is why pts can look a lot better than their ABG's would suggest and why it's so difficult to prognosticate endstage dz.  It's also why I strongly rec DNI status (ventilating pts down to a nl pC02 adversely affects this compensatory mechanism)  - he is not retaining much C02 yet but paradoxically would breathe a lot easier if we used a palliative approach here and allowed his pC02 to risk but this should only be done in a DNR setting.   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 L chest  by CT 1/25  - very limited options for w/u or rx at this point but at least needs sputum for afb and Quant GOLD TB  And check sputum also for fungus as this could be aspergilloma/ mycetoma with bronchogenic ca in the ddx as well   4) chronic / recurrent LE edema R>> L  -neg venous dopplers so this is probably mild cor pulmonale with venous insff component causing the asymmetery   4) probable anemia of chronic dz    Lab Results  Component Value Date   HGB 10.1 (L) 08/09/2019   HGB 9.8 (L) 08/08/2019   HGB 9.9 (L) 08/08/2019    Improved slightly on gerd rx empirically    SUMMARY OF TODAY'S PLAN:    Collect sputum, continue eol discusssions     LABS  Glucose Recent Labs  Lab 08/08/19 1318 08/08/19 1722 08/08/19 2119 08/09/19 0744  08/09/19 1139  GLUCAP 300* 249* 218* 257* 361*  BMET Recent Labs  Lab 08/08/19 0614 08/08/19 0828 08/09/19 0420  NA 134*  --  136  K 4.1  --  3.9  CL 93*  --  93*  CO2 28  --  30  BUN 12  --  21*  CREATININE 0.96 0.95 1.13  GLUCOSE 141*  --  256*    Liver Enzymes Recent Labs  Lab 08/08/19 0614  AST 24  ALT 24  ALKPHOS 60  BILITOT 0.5  ALBUMIN 2.7*    Electrolytes Recent Labs  Lab 08/08/19 0614 08/09/19 0420  CALCIUM 8.4* 8.4*    CBC Recent Labs  Lab 08/08/19 0614 08/08/19 0828 08/09/19 0420  WBC 5.8 5.4 3.9*  HGB 9.9* 9.8* 10.1*  HCT 32.0* 31.8* 33.0*  PLT 243 238 268    ABG Recent Labs  Lab 08/08/19 0815  PHART 7.404  PCO2ART 50.7*  PO2ART 117*    Coag's No results for input(s): APTT, INR in the last 168 hours.  Sepsis Markers Recent Labs  Lab 08/08/19 0828  PROCALCITON <0.10    Cardiac Enzymes No results for input(s): TROPONINI, PROBNP in the last 168 hours.    Christinia Gully, MD Pulmonary and [place] [phone] After 5:30 PM or weekends, use [facility] [phone]

## 2019-08-09 NOTE — Progress Notes (Signed)
OT Cancellation Note  Patient Details Name: ALFREDO SPONG MRN: [id number] DOB: [birth year]   Cancelled Treatment:    Reason Eval/Treat Not Completed: OT screened, no needs identified, will sign off;Other (comment). Pt screened for OT needs via observation of PT evaluation and discussion of functioning with PT. Pt limited in ADLs and functional mobility at baseline due to chronic SOB. Pt with assistance from family at home, not interested in rehab stay.  Pt appears close to baseline functioning, has been educated on energy conservation. Recommend HHOT evaluation to assess pt functioning in home environment, assist from family as needed. No further acute OT services required at this time.   Guadelupe Sabin, OTR/L  [phone] 08/09/2019, 8:41 AM

## 2019-08-09 NOTE — Progress Notes (Signed)
Patient refused SCD and TED hose stating he was allergic to material they are made of.

## 2019-08-09 NOTE — Evaluation (Signed)
Physical Therapy Evaluation Patient Details Name: Ryan [name] MRN: [id number] DOB: 1961-03-11 Today's Date: 08/09/2019   History of Present Illness  Ryan [name] is a 59 y.o. male with extensive past medical history including atrial fibrillation, chronic diastolic heart failure, COPD with chronic respiratory failure (at baseline using 6 L nasal cannula supplementation), gastroesophageal reflux disease/Barrett's esophagus, hyperlipidemia, type 2 diabetes mellitus and MAI infection (chronically followed by ID and receiving antibiotics for it); who presented to the emergency department secondary to worsening shortness of breath, hypoxia and increased wheezing.  Patient reports that for the last 4 days her breathing has decompensates and he has required higher levels oxygen at home.  He reports no fever, no chills, no chest pain, no nausea, no vomiting, no abdominal pain.  Positive intermittent productive coughing spells appreciated despite increasing his oxygen to 10 L experience.  Limited exertion capability and noted his O2 sat to be below 80%.    Clinical Impression  Patient functioning near baseline for functional mobility and gait, limited for ambulation mostly due to SOB with exertion and tolerated sitting up at bedside to eat breakfast after physical therapy.  Patient will benefit from continued physical therapy in hospital and recommended venue below to increase strength, balance, endurance for safe ADLs and gait.     Follow Up Recommendations Home health PT;Supervision - Intermittent    Equipment Recommendations  None recommended by PT    Recommendations for Other Services       Precautions / Restrictions Precautions Precautions: Fall Restrictions Weight Bearing Restrictions: No      Mobility  Bed Mobility Overal bed mobility: Modified Independent             General bed mobility comments: head of bed raised, slightly labored movement  Transfers Overall transfer  level: Needs assistance Equipment used: None;1 person hand held assist Transfers: Stand Pivot Transfers;Sit to/from Stand Sit to Stand: Min guard Stand pivot transfers: Min guard       General transfer comment: increased time, slightly labored  Ambulation/Gait Ambulation/Gait assistance: Min guard Gait Distance (Feet): 12 Feet Assistive device: None Gait Pattern/deviations: Decreased step length - right;Decreased step length - left;Decreased stride length Gait velocity: decreased   General Gait Details: limited to 12-13 steps at bedside, slightly unsteady, no loss of balance, limited secondary to SOB  Stairs            Wheelchair Mobility    Modified Rankin (Stroke Patients Only)       Balance Overall balance assessment: Mild deficits observed, not formally tested                                           Pertinent Vitals/Pain Pain Assessment: No/denies pain    Home Living Family/patient expects to be discharged to:: Private residence   Available Help at Discharge: Family;Available PRN/intermittently Type of Home: House Home Access: Stairs to enter Entrance Stairs-Rails: Right;Left;Can reach both Entrance Stairs-Number of Steps: 6 Home Layout: One level Home Equipment: Walker - 2 wheels;Cane - single point;Tub bench;Bedside commode      Prior Function Level of Independence: Independent         Comments: household and short distanced community ambulator, on 6 LPM O2 continuous     Hand Dominance   Dominant Hand: Left    Extremity/Trunk Assessment   Upper Extremity Assessment Upper Extremity Assessment: Defer to OT evaluation  Lower Extremity Assessment Lower Extremity Assessment: Generalized weakness    Cervical / Trunk Assessment Cervical / Trunk Assessment: Kyphotic  Communication   Communication: No difficulties  Cognition Arousal/Alertness: Awake/alert Behavior During Therapy: WFL for tasks  assessed/performed Overall Cognitive Status: Within Functional Limits for tasks assessed                                        General Comments      Exercises     Assessment/Plan    PT Assessment Patient needs continued PT services  PT Problem List Decreased strength;Decreased activity tolerance;Decreased balance;Decreased mobility       PT Treatment Interventions Balance training;Gait training;Stair training;Functional mobility training;Therapeutic activities;Therapeutic exercise;Patient/family education    PT Goals (Current goals can be found in the Care Plan section)  Acute Rehab PT Goals Patient Stated Goal: return home with family to assist PT Goal Formulation: With patient Time For Goal Achievement: 08/16/19 Potential to Achieve Goals: Good    Frequency Min 3X/week   Barriers to discharge        Co-evaluation               AM-PAC PT "6 Clicks" Mobility  Outcome Measure Help needed turning from your back to your side while in a flat bed without using bedrails?: None Help needed moving from lying on your back to sitting on the side of a flat bed without using bedrails?: A Little(head of bed raised, unable to lie flat due to difficulty breathing) Help needed moving to and from a bed to a chair (including a wheelchair)?: A Little Help needed standing up from a chair using your arms (e.g., wheelchair or bedside chair)?: A Little Help needed to walk in hospital room?: A Little Help needed climbing 3-5 steps with a railing? : A Lot 6 Click Score: 18    End of Session Equipment Utilized During Treatment: Oxygen Activity Tolerance: Patient tolerated treatment well;Patient limited by fatigue Patient left: in bed;with call bell/phone within reach;Other (comment)(seated at bedside) Nurse Communication: Mobility status PT Visit Diagnosis: Unsteadiness on feet (R26.81);Other abnormalities of gait and mobility (R26.89);Muscle weakness (generalized)  (M62.81)    Time: 4081-4481 PT Time Calculation (min) (ACUTE ONLY): 22 min   Charges:   PT Evaluation $PT Eval Moderate Complexity: 1 Mod PT Treatments $Therapeutic Activity: 8-22 mins        8:22 AM, 08/09/19 Lonell Grandchild, MPT Physical Therapist with [facility] [phone] office [phone] mobile phone

## 2019-08-10 LAB — GLUCOSE, CAPILLARY
Glucose-Capillary: 350 mg/dL — ABNORMAL HIGH (ref 70–99)
Glucose-Capillary: 178 mg/dL — ABNORMAL HIGH (ref 70–99)
Glucose-Capillary: 296 mg/dL — ABNORMAL HIGH (ref 70–99)
Glucose-Capillary: 257 mg/dL — ABNORMAL HIGH (ref 70–99)

## 2019-08-10 LAB — QUANTIFERON-TB GOLD PLUS: QuantiFERON-TB Gold Plus: NEGATIVE

## 2019-08-10 LAB — ACID FAST SMEAR (AFB, MYCOBACTERIA): Acid Fast Smear: NEGATIVE

## 2019-08-10 LAB — QUANTIFERON-TB GOLD PLUS (RQFGPL)
QuantiFERON Mitogen Value: 1.29 IU/mL
QuantiFERON Nil Value: 0.03 IU/mL
QuantiFERON TB1 Ag Value: 0.05 IU/mL
QuantiFERON TB2 Ag Value: 0.04 IU/mL

## 2019-08-10 MED ORDER — METHYLPREDNISOLONE SODIUM SUCC 40 MG IJ SOLR
40.0000 mg | Freq: Two times a day (BID) | INTRAMUSCULAR | Status: DC
  Administered 2019-08-10 – 2019-08-14 (×8): 40 mg via INTRAVENOUS
  Filled 2019-08-10 (×9): qty 1

## 2019-08-10 MED ORDER — HYDROCODONE-HOMATROPINE 5-1.5 MG/5ML PO SYRP
5.0000 mL | ORAL_SOLUTION | Freq: Four times a day (QID) | ORAL | Status: DC | PRN
  Administered 2019-08-10 – 2019-08-14 (×13): 5 mL via ORAL
  Filled 2019-08-10 (×13): qty 5

## 2019-08-10 MED ORDER — OXYCODONE HCL 5 MG PO TABS
5.0000 mg | ORAL_TABLET | Freq: Four times a day (QID) | ORAL | Status: DC | PRN
  Administered 2019-08-10 – 2019-08-14 (×16): 5 mg via ORAL
  Filled 2019-08-10 (×16): qty 1

## 2019-08-10 MED ORDER — INSULIN GLARGINE 100 UNIT/ML ~~LOC~~ SOLN
48.0000 [IU] | Freq: Every day | SUBCUTANEOUS | Status: DC
  Administered 2019-08-10: 48 [IU] via SUBCUTANEOUS
  Filled 2019-08-10 (×2): qty 0.48

## 2019-08-10 MED ORDER — OXYCODONE-ACETAMINOPHEN 5-325 MG PO TABS
1.0000 | ORAL_TABLET | Freq: Four times a day (QID) | ORAL | Status: DC | PRN
  Administered 2019-08-10 – 2019-08-14 (×16): 1 via ORAL
  Filled 2019-08-10 (×16): qty 1

## 2019-08-10 NOTE — Progress Notes (Signed)
Patient Demographics:    Ryan [name], is a 59 y.o. male, DOB - 18-Sep-1960, DJM:[id number]  Admit date - 08/08/2019   Admitting Physician Barton Dubois, MD  Outpatient Primary MD for the patient is [name], MD  LOS - 2   Chief Complaint  Patient presents with   Shortness of Breath        Subjective:    Ryan [name] today has no fevers, no emesis,  No chest pain,  --becomes very tachycardic and dyspneic with minimal activity -Wife had numerous questions, questions answered  -Patient overall very unhappy, -Patient would like to have Dr. Luan Pulling back, advised patient and his wife that Dr. 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 male with past medical history relevant for chronic hypoxic respiratory failure on 6 L of oxygen at home, DM2, HTN, COPD, HFpEF/dCHF, HLD and GERD/Barret's, PAFib, MAI infection (chronically followed by ID and receiving antibiotics admitted on 08/08/2019 with worsening shortness of breath and cough, -worsening hypoxia, worsening bilateral lower extremity edema, dyspnea at rest and dyspnea on exertion--- found to have abnormal CT chest -Covid19 negative  A/P 1)Acute on chronic hypoxic respiratory failure--- suspect due to COPD exacerbation---pulm consult appreciated  -At baseline patient uses 6 L of nasal cannula  On admission was requiring high flow nasal cannula, -Continue to wean down oxygen to his baseline -Continue bronchodilators (level of albuterol preferred due to propensity towards  tachycardia and mucolytics -IV Solu-Medrol 60 mg every 8 hours -Patient's hypoxia is not new but his hypercapnia appears to be new  2)HFpEF--last known EF 55 to 60%,  -Appears euvolemic and compensated at this time -Continue Lasix 40 mg twice daily  3)H/o MAI with  abnormal CT chest finding---Thick-walled large cavitary lesion with internal debris in the posterior left hemithorax, markedly progressive from 09/02/2018. --- Differential diagnosis includes chronic infectious/inflammatory etiology, including mycetoma, can have this appearance. Malignancy cannot be excluded. -May be due to underlying MAI-- Cannot rule out ILD or malignancy or aspergilloma/ mycetoma -Sputum for AFB, Quant gold TB and sputum for fungus requested -Pulmonology consult appreciated -Continue azithromycin 500 mg daily and rifampin 600 mg daily  4)PAFib--- PTA was not on anticoagulation, tends to have RVR with bronchodilators -Continue Cardizem CD 120 mg daily for rate control   5)DM2-A1c is 8.3 reflecting uncontrolled diabetes, anticipate worsening hyperglycemia with steroids, Lantus insulin 40 units daily along with sliding scale NovoLog  6)Mild Anemia--- hemoglobin is 10.1, asymptomatic , s -Continue Protonix -Monitor H&H if H&H continues to drop consider inpatient GI consult otherwise deferred to outpatient as patient current cardiopulmonary status precludes endoluminal evaluation at this time  7)BPH --- stable, continue Flomax  8)Anxiety/Depression-stable, continue Celexa and lorazepam  9)GERD/Barrett's --- continue Protonix 40 mg twice daily  10) social/ethics--palliative care consult requested currently full code --Patient would like  to have Dr. Luan Pulling back, advised patient and his wife that Dr. Luan Pulling has retired   Disposition/Need for in-Hospital Stay- patient unable to be discharged at this time due to --- acute on chronic hypoxic respiratory failure requiring further stabilization--becomes very  tachycardic and dyspneic with minimal activity with worsening Hypoxia  Code Status : Full  Family Communication:  (patient is alert, awake and coherent) -Discussed with his wife  Disposition Plan  : TBD after improvement in respiratory status  Consults  : Pulmonology  DVT Prophylaxis  :  Lovenox - - SCDs   Lab Results  Component Value Date   PLT 268 08/09/2019    Inpatient Medications  Scheduled Meds:  arformoterol  15 mcg Nebulization BID   azithromycin  500 mg Oral Daily   benzonatate  200 mg Oral TID   budesonide (PULMICORT) nebulizer solution  0.5 mg Nebulization BID   Chlorhexidine Gluconate Cloth  6 each Topical Q0600   citalopram  20 mg Oral Daily   Clofazimine  100 mg Does not apply Daily   diltiazem  120 mg Oral Daily   enoxaparin (LOVENOX) injection  40 mg Subcutaneous Q24H   furosemide  40 mg Oral BID   guaiFENesin  1,200 mg Oral BID   insulin aspart  0-15 Units Subcutaneous TID WC   insulin aspart  0-5 Units Subcutaneous QHS   insulin glargine  48 Units Subcutaneous QHS   ipratropium  0.5 mg Nebulization Q6H   levalbuterol  0.63 mg Nebulization Q6H   loratadine  10 mg Oral Daily   mouth rinse  15 mL Mouth Rinse BID   methylPREDNISolone (SOLU-MEDROL) injection  40 mg Intravenous Q12H   montelukast  10 mg Oral Daily   pantoprazole  40 mg Oral BID AC   potassium chloride SA  40 mEq Oral Daily   rifampin  600 mg Oral Daily   rosuvastatin  10 mg Oral q morning - 10a   sodium chloride flush  3 mL Intravenous Q12H   tamsulosin  0.4 mg Oral BID   Continuous Infusions:  sodium chloride     PRN Meds:.sodium chloride, acetaminophen, fluticasone, HYDROcodone-homatropine, levalbuterol, LORazepam, oxyCODONE-acetaminophen **AND** oxyCODONE, phenol, sodium chloride flush    Anti-infectives (From admission, onward)   Start     Dose/Rate Route Frequency Ordered Stop   08/08/19 1200  azithromycin (ZITHROMAX) tablet 500 mg    Note to  Pharmacy: 1 Tab daily     500 mg Oral Daily 08/08/19 0825     08/08/19 1200  rifampin (RIFADIN) capsule 600 mg    Note to Pharmacy: Long-term medication     600 mg Oral Daily 08/08/19 0825     08/08/19 1000  levofloxacin (LEVAQUIN) tablet 500 mg  Status:  Discontinued     500 mg Oral Daily 08/08/19 0825 08/08/19 1156   08/08/19 0715  azithromycin (ZITHROMAX) 500 mg in sodium chloride 0.9 % 250 mL IVPB  Status:  Discontinued     500 mg 250 mL/hr over 60 Minutes Intravenous Every 24 hours 08/08/19 0706 08/08/19 0828   08/08/19 0715  cefTRIAXone (ROCEPHIN) 1 g in sodium chloride 0.9 % 100 mL IVPB     1 g 200 mL/hr over 30 Minutes Intravenous  Once 08/08/19 0706 08/08/19 0836        Objective:   Vitals:   08/10/19 0703 08/10/19 0758 08/10/19 1310 08/10/19 1533  BP: 112/65  112/65   Pulse: 86  (!) 102   Resp: (!) 22  (!) 22  Temp:   98.1 F (36.7 C)   TempSrc:   Oral   SpO2: 100% 98% 100% 100%  Weight:      Height:        Wt Readings from Last 3 Encounters:  08/10/19 78.3 kg  05/29/19 84.8 kg  01/10/19 85.3 kg     Intake/Output Summary (Last 24 hours) at 08/10/2019 1904 Last data filed at 08/10/2019 0000 Gross per 24 hour  Intake --  Output 200 ml  Net -200 ml     Physical Exam  Gen:- Awake Alert, some conversational dyspnea HEENT:- [place].AT, No sclera icterus Nose- [place] 6L/min Neck-Supple Neck,No JVD,.  Lungs-diminished in bases, scattered wheezing  CV- S1, S2 normal, irregularly irregular becomes very tachycardic with minimal activity  abd-  +ve B.Sounds, Abd Soft, No tenderness,    Extremity/Skin:-Chronic trace to +1 edema, pedal pulses present  Psych-affect is appropriate, oriented x3 Neuro-no new focal deficits, no tremors   Data Review:   Micro Results Recent Results (from the past 240 hour(s))  Respiratory Panel by RT PCR (Flu A&B, Covid) - Nasopharyngeal Swab     Status: None   Collection Time: 08/08/19  6:16 AM   Specimen: Nasopharyngeal Swab  Result  Value Ref Range Status   SARS Coronavirus 2 by RT PCR NEGATIVE NEGATIVE Final    Comment: (NOTE) SARS-CoV-2 target nucleic acids are NOT DETECTED. The SARS-CoV-2 RNA is generally detectable in upper respiratoy specimens during the acute phase of infection. The lowest concentration of SARS-CoV-2 viral copies this assay can detect is 131 copies/mL. A negative result does not preclude SARS-Cov-2 infection and should not be used as the sole basis for treatment or other patient management decisions. A negative result may occur with  improper specimen collection/handling, submission of specimen other than nasopharyngeal swab, presence of viral mutation(s) within the areas targeted by this assay, and inadequate number of viral copies (<131 copies/mL). A negative result must be combined with clinical observations, patient history, and epidemiological information. The expected result is Negative. Fact Sheet for Patients:  PinkCheek.be Fact Sheet for Healthcare Providers:  GravelBags.it This test is not yet ap proved or cleared by the [place] FDA and  has been authorized for detection and/or diagnosis of SARS-CoV-2 by FDA under an Emergency Use Authorization (EUA). This EUA will remain  in effect (meaning this test can be used) for the duration of the COVID-19 declaration under Section 564(b)(1) of the Act, 21 U.S.C. section 360bbb-3(b)(1), unless the authorization is terminated or revoked sooner.    Influenza A by PCR NEGATIVE NEGATIVE Final   Influenza B by PCR NEGATIVE NEGATIVE Final    Comment: (NOTE) The Xpert Xpress SARS-CoV-2/FLU/RSV assay is intended as an aid in  the diagnosis of influenza from Nasopharyngeal swab specimens and  should not be used as a sole basis for treatment. Nasal washings and  aspirates are unacceptable for Xpert Xpress SARS-CoV-2/FLU/RSV  testing. Fact Sheet for  Patients: PinkCheek.be Fact Sheet for Healthcare Providers: GravelBags.it This test is not yet approved or cleared by the [place] FDA and  has been authorized for detection and/or diagnosis of SARS-CoV-2 by  FDA under an Emergency Use Authorization (EUA). This EUA will remain  in effect (meaning this test can be used) for the duration of the  Covid-19 declaration under Section 564(b)(1) of the Act, 21  U.S.C. section 360bbb-3(b)(1), unless the authorization is  terminated or revoked. Performed at [facility], [address], [place], [place] [address]   Culture, sputum-assessment  Status: None   Collection Time: 08/08/19  8:17 AM   Specimen: Expectorated Sputum  Result Value Ref Range Status   Specimen Description EXPECTORATED SPUTUM  Final   Special Requests NONE  Final   Sputum evaluation   Final    THIS SPECIMEN IS ACCEPTABLE FOR SPUTUM CULTURE Performed at [facility], [address] [place], [place] [address]    Report Status 08/08/2019 FINAL  Final  Culture, respiratory     Status: None (Preliminary result)   Collection Time: 08/08/19  8:17 AM  Result Value Ref Range Status   Specimen Description   Final    EXPECTORATED SPUTUM Performed at [facility], [address] [place], [place] [address]    Special Requests   Final    NONE Reflexed from [id number] Performed at [facility], [address] [place], [place] [address]    Gram Stain   Final    MODERATE WBC PRESENT,BOTH PMN AND MONONUCLEAR RARE GRAM POSITIVE COCCI IN CLUSTERS    Culture   Final    FEW STENOTROPHOMONAS MALTOPHILIA CULTURE REINCUBATED FOR BETTER GROWTH SUSCEPTIBILITIES TO FOLLOW Performed at [facility], [place] [address], [place], [place] [address]    Report Status PENDING  Incomplete  Acid Fast Smear (AFB)     Status: None   Collection Time: 08/08/19  4:06 PM   Specimen: Sputum  Result Value Ref Range Status   AFB Specimen  Processing Concentration  Final   Acid Fast Smear Negative  Final    Comment: (NOTE) Performed At: [facility] [address] [place], [place] [id number] Rush Farmer MD QQ:[id number]    Source (AFB) SPUTUM  Final    Comment: Performed at [facility], [address], [place], [place] [address]  MRSA PCR Screening     Status: None   Collection Time: 08/08/19  4:48 PM   Specimen: Nasopharyngeal  Result Value Ref Range Status   MRSA by PCR NEGATIVE NEGATIVE Final    Comment:        The GeneXpert MRSA Assay (FDA approved for NASAL specimens only), is one component of a comprehensive MRSA colonization surveillance program. It is not intended to diagnose MRSA infection nor to guide or monitor treatment for MRSA infections. Performed at [facility], [address] [place], [place] [address]     Radiology Reports CT Chest High Resolution  Result Date: 08/08/2019 CLINICAL DATA:  Shortness of breath, productive cough. EXAM: CT CHEST WITHOUT CONTRAST TECHNIQUE: Multidetector CT imaging of the chest was performed following the standard protocol without intravenous contrast. High resolution imaging of the lungs, as well as inspiratory and expiratory imaging, was performed. COMPARISON:  09/02/2018, 09/01/2017 and 11/28/2015. FINDINGS: Cardiovascular: Atherosclerotic calcification of the aorta and coronary arteries. Pulmonic trunk is enlarged. Heart size normal. No pericardial effusion. Mediastinum/Nodes: No pathologically enlarged mediastinal or axillary lymph nodes. Hilar regions are difficult to evaluate without IV contrast. Esophagus is unremarkable. Lungs/Pleura: Image quality is degraded by respiratory motion. Severe bullous emphysema. Thick-walled cavity with internal debris in the posterior left hemithorax measures approximately 5.3 x 8.5 cm (5/43) and has enlarged significantly from prior exams. Dependent atelectasis in the left lower lobe. Lingular nodule measures 7 mm (5/88),  unchanged from 09/01/2017 and considered benign. Trace left pleural fluid. Airway is unremarkable. Upper Abdomen: Visualized portions of the liver, adrenal glands, kidneys, spleen, pancreas, stomach and bowel are grossly unremarkable with the exception of a small hiatal hernia. No upper abdominal adenopathy. Musculoskeletal: No worrisome lytic or sclerotic lesions.  IMPRESSION: 1. Thick-walled large cavitary lesion with internal debris in the posterior left hemithorax, markedly progressive from 09/02/2018. A chronic infectious/inflammatory etiology, including mycetoma, can have this appearance. Malignancy cannot be excluded. 2. Trace left pleural effusion. 3. Significant respiratory motion degrades image quality. No findings worrisome for interstitial lung disease. 4. Aortic atherosclerosis (ICD10-I70.0). Coronary artery calcification. 5. Enlarged pulmonic trunk, indicative of pulmonary arterial hypertension. 6. Severe bullous Emphysema (ICD10-J43.9). Electronically Signed   By: Lorin Picket M.D.   On: 08/08/2019 10:04   US Venous Img Lower Bilateral (DVT)  Result Date: 08/08/2019 CLINICAL DATA:  Leg swelling x1 month. EXAM: BILATERAL LOWER EXTREMITY VENOUS DOPPLER ULTRASOUND TECHNIQUE: Gray-scale sonography with compression, as well as color and duplex ultrasound, were performed to evaluate the deep venous system(s) from the level of the common femoral vein through the popliteal and proximal calf veins. COMPARISON:  None. FINDINGS: VENOUS Normal compressibility of the common femoral, superficial femoral, and popliteal veins, as well as the visualized calf veins. Visualized portions of profunda femoral vein and great saphenous vein unremarkable. No filling defects to suggest DVT on grayscale or color Doppler imaging. Doppler waveforms show normal direction of venous flow, normal respiratory phasicity and response to augmentation. Limited views of the contralateral common femoral vein are unremarkable. OTHER  None. Limitations: none IMPRESSION: No femoropopliteal DVT nor evidence of DVT within the visualized calf veins. If clinical symptoms are inconsistent or if there are persistent or worsening symptoms, further imaging (possibly involving the iliac veins) may be warranted. Electronically Signed   By: Virgina Norfolk M.D.   On: 08/08/2019 17:15   DG Chest Port 1 View  Result Date: 08/08/2019 CLINICAL DATA:  Increasing shortness of breath with productive cough EXAM: PORTABLE CHEST 1 VIEW COMPARISON:  Radiograph 05/04/2019, CT 09/02/2018 FINDINGS: Patchy opacities present in the right lung base as well as in the left infrahilar lung and periphery of the left upper lobe. Findings are on a background of diffuse biapical scarring and severe emphysematous changes with architectural distortion most pronounced in the left upper lung, better visualized on comparison cross-sectional imaging. Cardiomediastinal contours are similar to prior counting for differences in technique. Question some left pleural fluid tracking within the fissures. No clear pneumothorax is seen. Portion of the right costophrenic sulcus is collimated from view. No acute osseous or soft tissue abnormality. IMPRESSION: Patchy opacities in the right lung base as well as in the left infrahilar lung and periphery of the left upper lobe could reflect acute infectious consolidation. Background of severe emphysematous changes with widespread scarring and architectural distortion most pronounced in the left upper lung. Electronically Signed   By: Lovena Le M.D.   On: 08/08/2019 06:40  CBC Recent Labs  Lab 08/08/19 0614 08/08/19 0828 08/09/19 0420  WBC 5.8 5.4 3.9*  HGB 9.9* 9.8* 10.1*  HCT 32.0* 31.8* 33.0*  PLT 243  238 268  MCV 82.7 83.0 83.3  MCH 25.6* 25.6* 25.5*  MCHC 30.9 30.8 30.6  RDW 15.6* 15.7* 15.5    Chemistries  Recent Labs  Lab 08/08/19 0614 08/08/19 0828 08/09/19 0420  NA 134*  --  136  K 4.1  --  3.9  CL 93*  --  93*  CO2 28  --  30  GLUCOSE 141*  --  256*  BUN 12  --  21*  CREATININE 0.96 0.95 1.13  CALCIUM 8.4*  --  8.4*  AST 24  --   --   ALT 24  --   --   ALKPHOS 60  --   --   BILITOT 0.5  --   --    ------------------------------------------------------------------------------------------------------------------ No results for input(s): CHOL, HDL, LDLCALC, TRIG, CHOLHDL, LDLDIRECT in the last 72 hours.  Lab Results  Component Value Date   HGBA1C 8.3 (H) 08/08/2019   ------------------------------------------------------------------------------------------------------------------ No results for input(s): TSH, T4TOTAL, T3FREE, THYROIDAB in the last 72 hours.  Invalid input(s): FREET3 ------------------------------------------------------------------------------------------------------------------ No results for input(s): VITAMINB12, FOLATE, FERRITIN, TIBC, IRON, RETICCTPCT in the last 72 hours.  Coagulation profile No results for input(s): INR, PROTIME in the last 168 hours.  No results for input(s): DDIMER in the last 72 hours.  Cardiac Enzymes No results for input(s): CKMB, TROPONINI, MYOGLOBIN in the last 168 hours.  Invalid input(s): CK ------------------------------------------------------------------------------------------------------------------    Component Value Date/Time   BNP 63.0 08/08/2019 7253   Roxan Hockey M.D on 08/10/2019 at 7:04 PM  Go to [email or web address] - for contact info  [facility] - Office  [phone]

## 2019-08-10 NOTE — Progress Notes (Signed)
PULMONARY / CRITICAL CARE MEDICINE   NAME:  Ryan [name], MRN:  [id number], DOB:  [birth year], LOS: 2 ADMISSION DATE:  08/08/2019, CONSULTATION DATE:  1/25 REFERRING MD:  Triad , CHIEF COMPLAINT:  Sob   BRIEF HISTORY:    58 yowm quit smoking 04/2015 ip seen then in pulmonary clinic by [place] with GOLD II copd spirometry /02 dep  then @ 3lpm NP and followed by Luan Pulling since then on floor of pred 10 mg daily and prn levaquin/ pred 40 for flares  with Dx MAI followed by [facility]  Admit 1/25 with one week h/o progressive resting sob/ cough congestion in resp distress on arrival  and pccm service asked to evaluate  By Triad pm 1/25             SIGNIFICANT PAST MEDICAL HISTORY   COPD  Documented  Severe GOLD II / borderline III  quit smoking 04/2015 PFT's  03/02/08  FEV1 1.78 (52 % ) ratio 39  p no % improvement from saba with DLCO  46 % corrects to 42 % for alv volume      SIGNIFICANT EVENTS:    STUDIES:   Venous dopplers 1/25 > neg bilaterally    CULTURES:  Sputum 1/25  Mod wbc, rare GPCs in clusters>>> stenotrophomanas maltophilia  MRSA pcr  1/25  Neg Covid PCR 1/25 neg  Sputum AFB 1/25 >>> neg smear >>>  Sputum fungus 1/25 >>>   ANTIBIOTICS:  Rocephin 1/25 only  Zpak/rifampin chronically continued 1/25 >>>   LINES/TUBES:    CONSULTANTS:  PCCM 1/25  Scheduled Meds: . arformoterol  15 mcg Nebulization BID  . azithromycin  500 mg Oral Daily  . benzonatate  200 mg Oral TID  . budesonide (PULMICORT) nebulizer solution  0.5 mg Nebulization BID  . Chlorhexidine Gluconate Cloth  6 each Topical Q0600  . citalopram  20 mg Oral Daily  . Clofazimine  100 mg Does not apply Daily  . diltiazem  120 mg Oral Daily  . enoxaparin (LOVENOX) injection  40 mg Subcutaneous Q24H  . furosemide  40 mg Oral BID  . guaiFENesin  1,200 mg Oral BID  . insulin aspart  0-15 Units Subcutaneous TID WC  . insulin aspart  0-5 Units Subcutaneous QHS  . insulin glargine  40 Units Subcutaneous QHS  .  ipratropium  0.5 mg Nebulization Q6H  . levalbuterol  0.63 mg Nebulization Q6H  . loratadine  10 mg Oral Daily  . mouth rinse  15 mL Mouth Rinse BID  . methylPREDNISolone (SOLU-MEDROL) injection  60 mg Intravenous Q8H  . montelukast  10 mg Oral Daily  . pantoprazole  40 mg Oral BID AC  . potassium chloride SA  40 mEq Oral Daily  . rifampin  600 mg Oral Daily  . rosuvastatin  10 mg Oral q morning - 10a  . sodium chloride flush  3 mL Intravenous Q12H  . tamsulosin  0.4 mg Oral BID   Continuous Infusions: . sodium chloride     PRN Meds:.sodium chloride, acetaminophen, fluticasone, HYDROcodone-homatropine, levalbuterol, LORazepam, oxyCODONE-acetaminophen **AND** oxyCODONE, phenol, sodium chloride flush   SUBJECTIVE:      CONSTITUTIONAL: BP 112/65 (BP Location: Left Arm)   Pulse 86   Temp 97.9 F (36.6 C) (Oral)   Resp (!) 22   Ht 5\' 8"  (1.727 m)   Wt 78.3 kg   SpO2 98%   BMI 26.25 kg/m     I/O last 3 completed shifts: In: 360 [P.O.:360] Out: 200 [Urine:200]  PHYSICAL EXAM:  Hoarse chronically ill wm nad at rest/ 10 lpm NP     HEENT : pt wearing mask not removed for exam due to covid -19 concerns.    NECK :  without JVD/Nodes/TM/ nl carotid upstrokes bilaterally   LUNGS: no acc muscle use,  Mod barrel  contour chest wall with bilateral  Distant bs s audible wheeze and  without cough on insp or exp maneuvers and mod  Hyperresonant  to  percussion bilaterally     CV:  RRR  no s3 or murmur or increase in P2, and no edema   ABD:  soft and nontender with pos mid insp Hoover's  in the supine position. No bruits or organomegaly appreciated, bowel sounds nl  MS:     ext warm without deformities, calf tenderness, cyanosis - Moderate clubbing No obvious joint restrictions   SKIN: warm and dry without lesions    NEURO:  alert, approp, nl sensorium with  no motor or cerebellar deficits apparent.                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   severe airflow on prior spirometry by hx / 02 dep/steroid dep so techically GOLD IV with MMRC = 3 on "best days" >>> rx duobneb/steroids/ ppi / flutter and mucinex dm  >>> started End of life discussions with pt/ agree with palliative care consultation and note is listed as DNR previously > palliative care input reviewed/ appreciated      2) acute hypercarbic and hypoxemic resp failure secondary to # 1 with nl baseline bicarb so this is not likely much of a baseline C02 retainer- titrate 02 to keep sats low 90s  - note appears to be using 02 to palliate sob, advised against this and instead titrate to low 90s   3)  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 L chest  by CT 1/25  - very limited options for w/u or rx at this point but at least needs sputum for afb and Quant GOLD TB  And check sputum also for fungus as this could be aspergilloma/ mycetoma with bronchogenic ca in the ddx as well  - so far only pos sputum is for sten multiphia, likely resistant to FQ since uses chronically and likely a colonizer so hold off on rx for now unless more convincing infection present  4) chronic / recurrent LE edema R>> L  -neg venous dopplers 1/15  so this is probably mild cor pulmonale with venous insff component causing the asymmetery   4) probable anemia of chronic dz    Lab Results  Component Value Date   HGB 10.1 (L) 08/09/2019   HGB 9.8 (L) 08/08/2019   HGB 9.9 (L) 08/08/2019    No gross GIB/ on PPi    SUMMARY OF TODAY'S PLAN:  Await sputum cultures/ wean steroids and 02 as tol    LABS  Glucose Recent Labs  Lab 08/09/19 0744 08/09/19 1139 08/09/19 1605 08/09/19 2159 08/10/19 0747 08/10/19 1120  GLUCAP 257* 361* 144* 278* 350* 178*    BMET Recent Labs  Lab 08/08/19 0614 08/08/19 0828 08/09/19 0420  NA 134*  --  136  K 4.1  --  3.9  CL 93*  --  93*  CO2 28  --  30  BUN 12  --  21*  CREATININE 0.96 0.95 1.13  GLUCOSE 141*  --  256*  Liver Enzymes Recent Labs  Lab 08/08/19 0614  AST 24  ALT 24  ALKPHOS 60  BILITOT 0.5  ALBUMIN 2.7*    Electrolytes Recent Labs  Lab 08/08/19 0614 08/09/19 0420  CALCIUM 8.4* 8.4*    CBC Recent Labs  Lab 08/08/19 0614 08/08/19 0828 08/09/19 0420  WBC 5.8 5.4 3.9*  HGB 9.9* 9.8* 10.1*  HCT 32.0* 31.8* 33.0*  PLT 243 238 268    ABG Recent Labs  Lab 08/08/19 0815  PHART 7.404  PCO2ART 50.7*  PO2ART 117*    Coag's No results for input(s): APTT, INR in the last 168 hours.  Sepsis Markers Recent Labs  Lab 08/08/19 0828  PROCALCITON <0.10    Cardiac Enzymes No results for input(s): TROPONINI, PROBNP in the last 168 hours.    Christinia Gully, MD [facility]monary and [place] [phone] After 5:30 PM or weekends, use Beeper [phone]

## 2019-08-10 NOTE — Progress Notes (Signed)
Inpatient Diabetes Program Recommendations  AACE/ADA: New Consensus Statement on Inpatient Glycemic Control (2015)  Target Ranges:  Prepandial:   less than 140 mg/dL      Peak postprandial:   less than 180 mg/dL (1-2 hours)      Critically ill patients:  140 - 180 mg/dL   Lab Results  Component Value Date   GLUCAP 178 (H) 08/10/2019   HGBA1C 8.3 (H) 08/08/2019    Review of Glycemic Control  Diabetes history: DM2 Outpatient Diabetes medications: Lantus 58 units QD, Novolog 0-15 units tidwc Current orders for Inpatient glycemic control: Lantus 40 units QHS, Novolog 0-15 units tidwc and 0-5 units QHS  HgbA1C - 8.3%    Blood sugars 350, 178 mg/dL  Inpatient Diabetes Program Recommendations:     Increase Lantus to 48 units QHS  Will continue to follow.   Thank you. Lorenda Peck, RD, LDN, CDE Inpatient Diabetes Coordinator [phone]

## 2019-08-10 NOTE — Progress Notes (Signed)
Palliative: Ryan [name] is sitting on the edge of the bed in his room.  He will make and somewhat keep eye contact.  He appears weak and frail, older than stated age.  His wife Ryan [name] is at bedside at this time.  We talk about symptom management and the medical team's concern about providing additional pain/anxiety/breathlessness medication as he remains a full code.  We also talked about his doctors, who helps manage his health.  He and Ryan [name] share that he now has a PCP, they are unsure of who will be his pulmonologist, they also share that he has a "[facility] Dr[facility] who manages cavitary lung disease, giving experimental medication.  We talked about CODE STATUS.  At this point Ryan [name] wants to remain full scope/full code.  He shares that he feels that when he comes into the hospital, due to his age of 69, people just want him to "go ahead and die".  His wife states, "he wants to be resuscitated".  We talked about "ATTEMPTED resuscitation", and the realities of CPR and intubation.    We talked about the benefits of outpatient hospice for treat the treatable care.  I encouraged Ryan [name] and his wife to consider what he wants this time to look like, feel like.  I encourage them to have a face-to-face meeting with outpatient hospice provider, hear from them what they can and cannot do for him as far as care and symptom management.  I share that PCP can set this up for them outpatient.  Plan: At this point continue full scope/full code.  Follow-up outpatient with "[facility] Dr[facility] related to cavitary lung disease.   69 minutes Ryan Axe, NP Palliative Medicine Team Team Phone # [phone] Greater than 50% of this time was spent counseling and coordinating care related to the above assessment and plan.

## 2019-08-11 LAB — CULTURE, RESPIRATORY W GRAM STAIN

## 2019-08-11 LAB — GLUCOSE, CAPILLARY
Glucose-Capillary: 148 mg/dL — ABNORMAL HIGH (ref 70–99)
Glucose-Capillary: 239 mg/dL — ABNORMAL HIGH (ref 70–99)
Glucose-Capillary: 310 mg/dL — ABNORMAL HIGH (ref 70–99)
Glucose-Capillary: 286 mg/dL — ABNORMAL HIGH (ref 70–99)

## 2019-08-11 MED ORDER — INSULIN ASPART 100 UNIT/ML ~~LOC~~ SOLN
0.0000 [IU] | Freq: Three times a day (TID) | SUBCUTANEOUS | Status: DC
  Administered 2019-08-12: 4 [IU] via SUBCUTANEOUS
  Administered 2019-08-12: 20 [IU] via SUBCUTANEOUS
  Administered 2019-08-12: 11 [IU] via SUBCUTANEOUS
  Administered 2019-08-13: 3 [IU] via SUBCUTANEOUS
  Administered 2019-08-13: 11 [IU] via SUBCUTANEOUS
  Administered 2019-08-13: 7 [IU] via SUBCUTANEOUS
  Administered 2019-08-14: 20 [IU] via SUBCUTANEOUS
  Administered 2019-08-14: 3 [IU] via SUBCUTANEOUS

## 2019-08-11 MED ORDER — FLUCONAZOLE 100 MG PO TABS
200.0000 mg | ORAL_TABLET | Freq: Every day | ORAL | Status: DC
  Administered 2019-08-11 – 2019-08-14 (×4): 200 mg via ORAL
  Filled 2019-08-11 (×4): qty 2

## 2019-08-11 MED ORDER — INSULIN ASPART 100 UNIT/ML ~~LOC~~ SOLN
0.0000 [IU] | Freq: Every day | SUBCUTANEOUS | Status: DC
  Administered 2019-08-11 – 2019-08-12 (×2): 3 [IU] via SUBCUTANEOUS
  Administered 2019-08-13: 4 [IU] via SUBCUTANEOUS

## 2019-08-11 MED ORDER — INSULIN ASPART 100 UNIT/ML ~~LOC~~ SOLN
6.0000 [IU] | Freq: Three times a day (TID) | SUBCUTANEOUS | Status: DC
  Administered 2019-08-12 – 2019-08-14 (×8): 6 [IU] via SUBCUTANEOUS

## 2019-08-11 MED ORDER — INSULIN GLARGINE 100 UNIT/ML ~~LOC~~ SOLN
50.0000 [IU] | Freq: Every day | SUBCUTANEOUS | Status: DC
  Administered 2019-08-11 – 2019-08-13 (×3): 50 [IU] via SUBCUTANEOUS
  Filled 2019-08-11 (×4): qty 0.5

## 2019-08-11 MED ORDER — SULFAMETHOXAZOLE-TRIMETHOPRIM 800-160 MG PO TABS
1.0000 | ORAL_TABLET | Freq: Two times a day (BID) | ORAL | Status: DC
  Administered 2019-08-11 – 2019-08-14 (×6): 1 via ORAL
  Filled 2019-08-11 (×6): qty 1

## 2019-08-11 NOTE — Progress Notes (Signed)
      Sputum culture from 08/08/2019 with STENOTROPHOMONAS MALTOPHILIA --- Dr. [name] recommends Bactrim DS 1 twice daily for 20 days with outpatient repeat sputum culture    Roxan Hockey, MD

## 2019-08-11 NOTE — Progress Notes (Signed)
Patient Demographics:    Ryan [name], is a 59 y.o. male, DOB - [birth year], XTK:[id number]  Admit date - 08/08/2019   Admitting Physician Ryan Dubois, MD  Outpatient Primary MD for the patient is Ryan Du, MD  LOS - 3   Chief Complaint  Patient presents with  . Shortness of Breath        Subjective:    Ryan [name] today has no fevers, no emesis,  No chest pain,    -O2 sats typically drops when patient gets his coughing spells or anxiety and panic attacks -Patient remains unhappy with work-up to rule out fungus and TB -Continues to question the need for sputum collection and current work-up as recommended by pulmonologist Dr. Melvyn [name] --- Patient continues to remind me that Dr. 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 male with past medical history relevant for chronic hypoxic respiratory failure on 6 L of oxygen at home, DM2, HTN, COPD, HFpEF/dCHF, HLD and GERD/Barret's, PAFib, MAI infection (chronically followed by ID and receiving antibiotics admitted on 08/08/2019 with worsening shortness of breath and cough, -worsening hypoxia, worsening bilateral lower extremity edema, dyspnea at rest and dyspnea on exertion--- found to have abnormal CT chest -Covid19 negative -Sputum for fungus and AFB in progress   A/P 1)Acute on chronic hypoxic respiratory  failure--- suspect due to COPD exacerbation---pulm consult appreciated  -At baseline patient uses 6 L of nasal cannula  On admission was requiring high flow nasal cannula, -Continue to wean down oxygen to his baseline (currently fluctuating between 8 to 10 L of oxygen per minute to keep O2 sats in the low 90s) -Continue bronchodilators (level of albuterol preferred due to propensity towards tachycardia) and mucolytics -IV Solu-Medrol 40 mg every 12 hours -Patient's hypoxia is not new but his hypercapnia appears to be new -Be judicious with benzos and sedatives due to worsening hypoxia and "new" hypercapnia -Repeat ABG in am -Sputum culture from 08/08/2019 with STENOTROPHOMONAS MALTOPHILIA as well as Pseudohyphae and yeasts--- awaiting further input from pulmonologist Dr. Melvyn [name]  2)HFpEF--last known EF 55 to 60%,  -Appears euvolemic and compensated at this time -Continue Lasix 40 mg twice daily -Pt Tells me  he is allergic to Ryan [name] stockings  3)H/o MAI with  abnormal CT chest finding---Thick-walled large cavitary lesion with internal debris in the posterior left hemithorax, markedly progressive from 09/02/2018. --- Differential diagnosis includes chronic infectious/inflammatory etiology, including mycetoma, can have this appearance. Malignancy cannot be excluded. -May be due to underlying MAI-- Cannot rule out ILD or malignancy or aspergilloma/ mycetoma -Sputum for AFB, Quant gold TB and sputum for fungus requested -Pulmonology consult appreciated -Continue azithromycin 500 mg daily and rifampin  600 mg daily  4)PAFib--- PTA was not on anticoagulation, tends to have RVR with bronchodilators -Continue Cardizem CD 120 mg daily for rate control   5)DM2-A1c is 8.3 reflecting uncontrolled diabetes, anticipate worsening hyperglycemia with steroids, increase Lantus insulin to 50  units daily along with sliding scale NovoLog  6)Mild Anemia--- hemoglobin is 10.1, asymptomatic , s -Continue  Protonix -Monitor H&H if H&H continues to drop consider inpatient GI consult otherwise deferred to outpatient as patient current cardiopulmonary status precludes endoluminal evaluation at this time  7)BPH --- stable, continue Flomax  8)Anxiety/Depression-stable, continue Celexa and lorazepam -Patient and his wife unhappy with my reluctance to escalate is benzos, opiates and sedatives, they both tell me that Dr. Luan [name] used to be more "generous" with benzos and sedatives --I reminded them that we needed to be  judicious with benzos, opiates and sedatives due to worsening hypoxia and "new" hypercapnia  9)GERD/Barrett's --- continue Protonix 40 mg twice daily  10)Social/ethics--palliative care consult requested currently full code- --Patient speaks of his nostalgia about Dr. Luan [name] , advised patient and his wife that Dr. Luan [name] has retired  Disposition/Need for in-Hospital Stay- patient unable to be discharged at this time due to --- acute on chronic hypoxic respiratory failure requiring further stabilization--becomes very tachycardic and dyspneic with minimal activity with worsening Hypoxia  Code Status : Full  Family Communication:  (patient is alert, awake and coherent) -Discussed with his wife who is at bedside  Disposition Plan  : TBD after improvement in respiratory status  Consults  : Pulmonology  DVT Prophylaxis  :  Lovenox - - SCDs   Lab Results  Component Value Date   PLT 268 08/09/2019    Inpatient Medications  Scheduled Meds: . arformoterol  15 mcg Nebulization BID  . azithromycin  500 mg Oral Daily  . benzonatate  200 mg Oral TID  . budesonide (PULMICORT) nebulizer solution  0.5 mg Nebulization BID  . Chlorhexidine Gluconate Cloth  6 each Topical Q0600  . citalopram  20 mg Oral Daily  . Clofazimine  100 mg Does not apply Daily  . diltiazem  120 mg Oral Daily  . enoxaparin (LOVENOX) injection  40 mg Subcutaneous Q24H  . furosemide  40 mg Oral BID  . guaiFENesin   1,200 mg Oral BID  . insulin aspart  0-15 Units Subcutaneous TID WC  . insulin aspart  0-5 Units Subcutaneous QHS  . insulin glargine  50 Units Subcutaneous QHS  . ipratropium  0.5 mg Nebulization Q6H  . levalbuterol  0.63 mg Nebulization Q6H  . loratadine  10 mg Oral Daily  . mouth rinse  15 mL Mouth Rinse BID  . methylPREDNISolone (SOLU-MEDROL) injection  40 mg Intravenous Q12H  . montelukast  10 mg Oral Daily  . pantoprazole  40 mg Oral BID AC  . potassium chloride SA  40 mEq Oral Daily  . rifampin  600 mg Oral Daily  . rosuvastatin  10 mg Oral q morning - 10a  . sodium chloride flush  3 mL Intravenous Q12H  . tamsulosin  0.4 mg Oral BID   Continuous Infusions: . sodium chloride     PRN Meds:.sodium chloride, acetaminophen, fluticasone, HYDROcodone-homatropine, levalbuterol, LORazepam, oxyCODONE-acetaminophen **AND** oxyCODONE, phenol, sodium chloride flush    Anti-infectives (From admission, onward)   Start     Dose/Rate Route Frequency Ordered Stop   08/08/19 1200  azithromycin (ZITHROMAX) tablet 500 mg    Note to Pharmacy: 1 Tab daily     500 mg Oral Daily  08/08/19 0825     08/08/19 1200  rifampin (RIFADIN) capsule 600 mg    Note to Pharmacy: Long-term medication     600 mg Oral Daily 08/08/19 0825     08/08/19 1000  levofloxacin (LEVAQUIN) tablet 500 mg  Status:  Discontinued     500 mg Oral Daily 08/08/19 0825 08/08/19 1156   08/08/19 0715  azithromycin (ZITHROMAX) 500 mg in sodium chloride 0.9 % 250 mL IVPB  Status:  Discontinued     500 mg 250 mL/hr over 60 Minutes Intravenous Every 24 hours 08/08/19 0706 08/08/19 0828   08/08/19 0715  cefTRIAXone (ROCEPHIN) 1 g in sodium chloride 0.9 % 100 mL IVPB     1 g 200 mL/hr over 30 Minutes Intravenous  Once 08/08/19 0706 08/08/19 0836        Objective:   Vitals:   08/11/19 0442 08/11/19 0806 08/11/19 1406 08/11/19 1503  BP: 115/71  135/70   Pulse: 83  98   Resp: 18  (!) 22   Temp: 98.1 F (36.7 C)  97.8 F (36.6  C)   TempSrc: Oral  Oral   SpO2: 96% 96% 98% 94%  Weight:      Height:        Wt Readings from Last 3 Encounters:  08/10/19 78.3 kg  05/29/19 84.8 kg  01/10/19 85.3 kg     Intake/Output Summary (Last 24 hours) at 08/11/2019 1726 Last data filed at 08/11/2019 0900 Gross per 24 hour  Intake 240 ml  Output 600 ml  Net -360 ml     Physical Exam  Gen:- Awake Alert, some conversational dyspnea HEENT:- [place].AT, No sclera icterus Nose- [place] 10L/min Neck-Supple Neck,No JVD,.  Lungs-diminished in bases, scattered wheezing  CV- S1, S2 normal, irregularly irregular becomes very tachycardic with minimal activity  abd-  +ve B.Sounds, Abd Soft, No tenderness,    Extremity/Skin:-Chronic trace to +1 edema, pedal pulses present (RT > Lt) Psych-affect is anxious, oriented x3 Neuro-no new focal deficits, no tremors   Data Review:   Micro Results Recent Results (from the past 240 hour(s))  Respiratory Panel by RT PCR (Flu A&B, Covid) - Nasopharyngeal Swab     Status: None   Collection Time: 08/08/19  6:16 AM   Specimen: Nasopharyngeal Swab  Result Value Ref Range Status   SARS Coronavirus 2 by RT PCR NEGATIVE NEGATIVE Final    Comment: (NOTE) SARS-CoV-2 target nucleic acids are NOT DETECTED. The SARS-CoV-2 RNA is generally detectable in upper respiratoy specimens during the acute phase of infection. The lowest concentration of SARS-CoV-2 viral copies this assay can detect is 131 copies/mL. A negative result does not preclude SARS-Cov-2 infection and should not be used as the sole basis for treatment or other patient management decisions. A negative result may occur with  improper specimen collection/handling, submission of specimen other than nasopharyngeal swab, presence of viral mutation(s) within the areas targeted by this assay, and inadequate number of viral copies (<131 copies/mL). A negative result must be combined with clinical observations, patient history, and epidemiological  information. The expected result is Negative. Fact Sheet for Patients:  PinkCheek.be Fact Sheet for Healthcare Providers:  GravelBags.it This test is not yet ap proved or cleared by the [place] FDA and  has been authorized for detection and/or diagnosis of SARS-CoV-2 by FDA under an Emergency Use Authorization (EUA). This EUA will remain  in effect (meaning this test can be used) for the duration of the COVID-19 declaration under Section 564(b)(1) of the  Act, 21 U.S.C. section 360bbb-3(b)(1), unless the authorization is terminated or revoked sooner.    Influenza A by PCR NEGATIVE NEGATIVE Final   Influenza B by PCR NEGATIVE NEGATIVE Final    Comment: (NOTE) The Xpert Xpress SARS-CoV-2/FLU/RSV assay is intended as an aid in  the diagnosis of influenza from Nasopharyngeal swab specimens and  should not be used as a sole basis for treatment. Nasal washings and  aspirates are unacceptable for Xpert Xpress SARS-CoV-2/FLU/RSV  testing. Fact Sheet for Patients: PinkCheek.be Fact Sheet for Healthcare Providers: GravelBags.it This test is not yet approved or cleared by the [place] FDA and  has been authorized for detection and/or diagnosis of SARS-CoV-2 by  FDA under an Emergency Use Authorization (EUA). This EUA will remain  in effect (meaning this test can be used) for the duration of the  Covid-19 declaration under Section 564(b)(1) of the Act, 21  U.S.C. section 360bbb-3(b)(1), unless the authorization is  terminated or revoked. Performed at [facility], [address] [place], [place] [address]   Culture, sputum-assessment     Status: None   Collection Time: 08/08/19  8:17 AM   Specimen: Expectorated Sputum  Result Value Ref Range Status   Specimen Description EXPECTORATED SPUTUM  Final   Special Requests NONE  Final   Sputum evaluation   Final    THIS  SPECIMEN IS ACCEPTABLE FOR SPUTUM CULTURE Performed at [facility], [address], [place], [place] [address]    Report Status 08/08/2019 FINAL  Final  Culture, respiratory     Status: None   Collection Time: 08/08/19  8:17 AM  Result Value Ref Range Status   Specimen Description   Final    EXPECTORATED SPUTUM Performed at [facility], [address], [place], [place] [address]    Special Requests   Final    NONE Reflexed from [id number] Performed at [facility], [address] [place], [place] [address]    Gram Stain   Final    MODERATE WBC PRESENT,BOTH PMN AND MONONUCLEAR RARE GRAM POSITIVE COCCI IN CLUSTERS Performed at [facility], [place] [address], [place], [place] [address]    Culture FEW STENOTROPHOMONAS MALTOPHILIA  Final   Report Status 08/11/2019 FINAL  Final   Organism ID, Bacteria STENOTROPHOMONAS MALTOPHILIA  Final      Susceptibility   Stenotrophomonas maltophilia - MIC*    LEVOFLOXACIN >=8 RESISTANT Resistant     TRIMETH/SULFA <=20 SENSITIVE Sensitive     * FEW STENOTROPHOMONAS MALTOPHILIA  Acid Fast Smear (AFB)     Status: None   Collection Time: 08/08/19  4:06 PM   Specimen: Sputum  Result Value Ref Range Status   AFB Specimen Processing Concentration  Final   Acid Fast Smear Negative  Final    Comment: (NOTE) Performed At: [facility] [place], [place] [id number] Rush Farmer MD [id number]    Source (AFB) SPUTUM  Final    Comment: Performed at [facility], [address] [place],  [address]  Fungus Culture With Stain     Status: Abnormal (Preliminary result)   Collection Time: 08/08/19  4:06 PM   Specimen: Sputum  Result Value Ref Range Status   Fungus Stain Final report (A)  Final    Comment: (NOTE) Performed At: [facility] [place], [place] [id number] Rush Farmer MD [id number]    Fungus (Mycology) Culture PENDING  Incomplete   Fungal Source SPUTUM  Final    Comment: Performed  at [facility], [address], [place], [place] [address]  Fungus Culture Result     Status: Abnormal   Collection Time: 08/08/19  4:06 PM  Result Value Ref Range Status   Result 1 Comment (A)  Final    Comment: (NOTE) Pseudohyphae and yeasts observed Performed At: [facility] [place], [place] [id number] Rush Farmer MD [id number]   MRSA PCR Screening     Status: None   Collection Time: 08/08/19  4:48 PM   Specimen: Nasopharyngeal  Result Value Ref Range Status   MRSA by PCR NEGATIVE NEGATIVE Final    Comment:        The GeneXpert MRSA Assay (FDA approved for NASAL specimens only), is one component of a comprehensive MRSA colonization surveillance program. It is not intended to diagnose MRSA infection nor to guide or monitor treatment for MRSA infections. Performed at [facility], [address], [place], [place] [address]     Radiology Reports CT Chest High Resolution  Result Date: 08/08/2019 CLINICAL DATA:  Shortness of breath, productive cough. EXAM: CT CHEST WITHOUT CONTRAST TECHNIQUE: Multidetector CT imaging of the chest was performed following the standard protocol without intravenous contrast. High resolution imaging of the lungs, as well as inspiratory and expiratory imaging, was performed. COMPARISON:  09/02/2018, 09/01/2017 and 11/28/2015. FINDINGS: Cardiovascular: Atherosclerotic calcification of the aorta and coronary arteries. Pulmonic trunk is enlarged. Heart size normal. No pericardial effusion. Mediastinum/Nodes: No pathologically enlarged mediastinal or axillary lymph nodes. Hilar regions are difficult to evaluate without IV contrast. Esophagus is unremarkable. Lungs/Pleura: Image quality is degraded by respiratory motion. Severe bullous emphysema. Thick-walled cavity with internal debris in the posterior left hemithorax measures approximately 5.3 x 8.5 cm (5/43) and has enlarged significantly from prior exams. Dependent atelectasis in  the left lower lobe. Lingular nodule measures 7 mm (5/88), unchanged from 09/01/2017 and considered benign. Trace left pleural fluid. Airway is unremarkable. Upper Abdomen: Visualized portions of the liver, adrenal glands, kidneys, spleen, pancreas, stomach and bowel are grossly unremarkable with the exception of a small hiatal hernia. No upper abdominal adenopathy. Musculoskeletal: No worrisome lytic or sclerotic lesions. IMPRESSION: 1. Thick-walled large cavitary lesion with internal debris in the posterior left hemithorax, markedly progressive from 09/02/2018. A chronic infectious/inflammatory etiology, including mycetoma, can have this appearance. Malignancy cannot be excluded. 2. Trace left pleural effusion. 3. Significant respiratory motion degrades image quality. No findings worrisome for interstitial lung disease. 4. Aortic atherosclerosis (ICD10-I70.0). Coronary artery calcification. 5. Enlarged pulmonic trunk, indicative of pulmonary arterial hypertension. 6. Severe bullous Emphysema (ICD10-J43.9). Electronically Signed   By: Lorin Picket M.D.   On: 08/08/2019 10:04   US Venous Img Lower Bilateral (DVT)  Result Date: 08/08/2019 CLINICAL DATA:  Leg swelling x1 month. EXAM: BILATERAL LOWER EXTREMITY VENOUS DOPPLER ULTRASOUND TECHNIQUE: Gray-scale sonography with compression, as well as color and duplex ultrasound, were performed to evaluate the deep venous system(s) from the level of the common femoral vein through the popliteal and proximal calf veins. COMPARISON:  None. FINDINGS: VENOUS Normal compressibility of the common femoral, superficial femoral, and popliteal veins, as well as the visualized calf veins. Visualized portions of profunda femoral vein and great saphenous vein unremarkable. No filling defects to suggest DVT on grayscale or color Doppler imaging. Doppler waveforms show normal direction of venous flow, normal respiratory phasicity and response to augmentation. Limited views of the  contralateral common femoral vein are unremarkable. OTHER None. Limitations: none IMPRESSION: No femoropopliteal DVT nor evidence of DVT within the visualized calf veins.  If clinical symptoms are inconsistent or if there are persistent or worsening symptoms, further imaging (possibly involving the iliac veins) may be warranted. Electronically Signed   By: Virgina Norfolk M.D.   On: 08/08/2019 17:15   DG Chest Port 1 View  Result Date: 08/08/2019 CLINICAL DATA:  Increasing shortness of breath with productive cough EXAM: PORTABLE CHEST 1 VIEW COMPARISON:  Radiograph 05/04/2019, CT 09/02/2018 FINDINGS: Patchy opacities present in the right lung base as well as in the left infrahilar lung and periphery of the left upper lobe. Findings are on a background of diffuse biapical scarring and severe emphysematous changes with architectural distortion most pronounced in the left upper lung, better visualized on comparison cross-sectional imaging. Cardiomediastinal contours are similar to prior counting for differences in technique. Question some left pleural fluid tracking within the fissures. No clear pneumothorax is seen. Portion of the right costophrenic sulcus is collimated from view. No acute osseous or soft tissue abnormality. IMPRESSION: Patchy opacities in the right lung base as well as in the left infrahilar lung and periphery of the left upper lobe could reflect acute infectious consolidation. Background of severe emphysematous changes with widespread scarring and architectural distortion most pronounced in the left upper lung. Electronically Signed   By: Lovena Le M.D.   On: 08/08/2019 06:40                                                                                                                                                                                                                                            CBC Recent Labs  Lab 08/08/19 0614 08/08/19 0828 08/09/19 0420  WBC 5.8 5.4  3.9*  HGB 9.9* 9.8* 10.1*  HCT 32.0* 31.8* 33.0*  PLT 243 238 268  MCV 82.7 83.0 83.3  MCH 25.6* 25.6* 25.5*  MCHC 30.9 30.8 30.6  RDW 15.6* 15.7* 15.5    Chemistries  Recent Labs  Lab 08/08/19 0614 08/08/19 0828 08/09/19 0420  NA 134*  --  136  K 4.1  --  3.9  CL 93*  --  93*  CO2 28  --  30  GLUCOSE 141*  --  256*  BUN 12  --  21*  CREATININE 0.96 0.95 1.13  CALCIUM 8.4*  --  8.4*  AST 24  --   --   ALT 24  --   --   ALKPHOS  60  --   --   BILITOT 0.5  --   --    ------------------------------------------------------------------------------------------------------------------ No results for input(s): CHOL, HDL, LDLCALC, TRIG, CHOLHDL, LDLDIRECT in the last 72 hours.  Lab Results  Component Value Date   HGBA1C 8.3 (H) 08/08/2019   ------------------------------------------------------------------------------------------------------------------ No results for input(s): TSH, T4TOTAL, T3FREE, THYROIDAB in the last 72 hours.  Invalid input(s): FREET3 ------------------------------------------------------------------------------------------------------------------ No results for input(s): VITAMINB12, FOLATE, FERRITIN, TIBC, IRON, RETICCTPCT in the last 72 hours.  Coagulation profile No results for input(s): INR, PROTIME in the last 168 hours.  No results for input(s): DDIMER in the last 72 hours.  Cardiac Enzymes No results for input(s): CKMB, TROPONINI, MYOGLOBIN in the last 168 hours.  Invalid input(s): CK ------------------------------------------------------------------------------------------------------------------    Component Value Date/Time   BNP 63.0 08/08/2019 0301   Roxan Hockey M.D on 08/11/2019 at 5:26 PM  Go to [email or web address] - for contact info  [facility] - Office  [phone]

## 2019-08-12 DIAGNOSIS — A498 Other bacterial infections of unspecified site: Secondary | ICD-10-CM | POA: Diagnosis present

## 2019-08-12 LAB — BLOOD GAS, ARTERIAL
Acid-Base Excess: 7.4 mmol/L — ABNORMAL HIGH (ref 0.0–2.0)
Bicarbonate: 30.6 mmol/L — ABNORMAL HIGH (ref 20.0–28.0)
Drawn by: 27407
FIO2: 60
O2 Saturation: 82 %
Patient temperature: 37
pCO2 arterial: 47.6 mmHg (ref 32.0–48.0)
pH, Arterial: 7.439 (ref 7.350–7.450)
pO2, Arterial: 52.8 mmHg — ABNORMAL LOW (ref 83.0–108.0)

## 2019-08-12 LAB — ACID FAST SMEAR (AFB, MYCOBACTERIA)
Acid Fast Smear: NEGATIVE
Acid Fast Smear: NEGATIVE

## 2019-08-12 LAB — BASIC METABOLIC PANEL
Anion gap: 10 (ref 5–15)
BUN: 17 mg/dL (ref 6–20)
CO2: 33 mmol/L — ABNORMAL HIGH (ref 22–32)
Calcium: 8.3 mg/dL — ABNORMAL LOW (ref 8.9–10.3)
Chloride: 92 mmol/L — ABNORMAL LOW (ref 98–111)
Creatinine, Ser: 1.11 mg/dL (ref 0.61–1.24)
GFR calc Af Amer: >60 mL/min (ref >60)
GFR calc non Af Amer: >60 mL/min (ref >60)
Glucose, Bld: 334 mg/dL — ABNORMAL HIGH (ref 70–99)
Potassium: 4.2 mmol/L (ref 3.5–5.1)
Sodium: 135 mmol/L (ref 135–145)

## 2019-08-12 LAB — CBC
HCT: 32.7 % — ABNORMAL LOW (ref 39.0–52.0)
Hemoglobin: 9.9 g/dL — ABNORMAL LOW (ref 13.0–17.0)
MCH: 25.2 pg — ABNORMAL LOW (ref 26.0–34.0)
MCHC: 30.3 g/dL (ref 30.0–36.0)
MCV: 83.2 fL (ref 80.0–100.0)
Platelets: 284 10*3/uL (ref 150–400)
RBC: 3.93 MIL/uL — ABNORMAL LOW (ref 4.22–5.81)
RDW: 15.8 % — ABNORMAL HIGH (ref 11.5–15.5)
WBC: 6 10*3/uL (ref 4.0–10.5)
nRBC: 0.3 % — ABNORMAL HIGH (ref 0.0–0.2)

## 2019-08-12 LAB — GLUCOSE, CAPILLARY
Glucose-Capillary: 268 mg/dL — ABNORMAL HIGH (ref 70–99)
Glucose-Capillary: 165 mg/dL — ABNORMAL HIGH (ref 70–99)
Glucose-Capillary: 394 mg/dL — ABNORMAL HIGH (ref 70–99)
Glucose-Capillary: 280 mg/dL — ABNORMAL HIGH (ref 70–99)

## 2019-08-12 MED ORDER — DM-GUAIFENESIN ER 30-600 MG PO TB12
2.0000 | ORAL_TABLET | Freq: Two times a day (BID) | ORAL | Status: DC
  Administered 2019-08-12 – 2019-08-14 (×5): 2 via ORAL
  Filled 2019-08-12 (×5): qty 2

## 2019-08-12 MED ORDER — GLUCERNA SHAKE PO LIQD
237.0000 mL | Freq: Two times a day (BID) | ORAL | Status: DC
  Administered 2019-08-12 – 2019-08-14 (×4): 237 mL via ORAL

## 2019-08-12 NOTE — Telephone Encounter (Signed)
Clofazimine was mailed to patient on 1/11.

## 2019-08-12 NOTE — Plan of Care (Signed)
  Problem: Education: Goal: Knowledge of General Education information will improve Description: Including pain rating scale, medication(s)/side effects and non-pharmacologic comfort measures Outcome: Progressing   Problem: Health Behavior/Discharge Planning: Goal: Ability to manage health-related needs will improve Outcome: Progressing   Problem: Clinical Measurements: Goal: Will remain free from infection Outcome: Progressing   

## 2019-08-12 NOTE — Progress Notes (Addendum)
PULMONARY / CRITICAL CARE MEDICINE   NAME:  Ryan [name], MRN:  [id number], DOB:  Feb 25, 1961, LOS: 4 ADMISSION DATE:  08/08/2019, CONSULTATION DATE:  1/25 REFERRING MD:  Triad , CHIEF COMPLAINT:  Sob   BRIEF HISTORY:    58 yowm quit smoking 04/2015 ip seen then in pulmonary clinic by Wetzel with GOLD II copd spirometry /02 dep  then @ 3lpm NP and followed by Luan Pulling since then on floor of pred 10 mg daily and prn levaquin/ pred 40 for flares  with Dx MAI followed by Comer  Admit 1/25 with one week h/o progressive resting sob/ cough congestion in resp distress on arrival  and pccm service asked to evaluate  By Triad pm 1/25             SIGNIFICANT PAST MEDICAL HISTORY   COPD  Documented  Severe GOLD II / borderline III  quit smoking 04/2015 PFT's  03/02/08  FEV1 1.78 (52 % ) ratio 39  p no % improvement from saba with DLCO  46 % corrects to 42 % for alv volume      SIGNIFICANT EVENTS:    STUDIES:   Venous dopplers 1/25 > neg bilaterally    CULTURES:  Sputum 1/25  Mod wbc, rare GPCs in clusters>>> stenotrophomanas maltophilia sensitive to bactrim, resistant to leviquin Quant GOLD TB 1/25  Neg  MRSA pcr  1/25  Neg Covid PCR 1/25 neg  Sputum AFB 1/25 >>> neg smear >>>  Sputum fungus 1/25 >>> yeast only on smear >>>   ANTIBIOTICS:  Rocephin 1/25 only  Zpak/rifampin chronically per Comer continued 1/25 >>>  Bactrim 1/28 x 20 days planned then ov with cxr and ? FOB then ? (doubtfull unless breathing much improved   LINES/TUBES:    CONSULTANTS:  PCCM 1/25  Scheduled Meds: . arformoterol  15 mcg Nebulization BID  . azithromycin  500 mg Oral Daily  . benzonatate  200 mg Oral TID  . budesonide (PULMICORT) nebulizer solution  0.5 mg Nebulization BID  . Chlorhexidine Gluconate Cloth  6 each Topical Q0600  . citalopram  20 mg Oral Daily  . Clofazimine  100 mg Does not apply Daily  . diltiazem  120 mg Oral Daily  . enoxaparin (LOVENOX) injection  40 mg Subcutaneous Q24H  . feeding  supplement (GLUCERNA SHAKE)  237 mL Oral BID BM  . fluconazole  200 mg Oral Daily  . furosemide  40 mg Oral BID  . guaiFENesin  1,200 mg Oral BID  . insulin aspart  0-20 Units Subcutaneous TID WC  . insulin aspart  0-5 Units Subcutaneous QHS  . insulin aspart  6 Units Subcutaneous TID WC  . insulin glargine  50 Units Subcutaneous QHS  . ipratropium  0.5 mg Nebulization Q6H  . levalbuterol  0.63 mg Nebulization Q6H  . loratadine  10 mg Oral Daily  . mouth rinse  15 mL Mouth Rinse BID  . methylPREDNISolone (SOLU-MEDROL) injection  40 mg Intravenous Q12H  . montelukast  10 mg Oral Daily  . pantoprazole  40 mg Oral BID AC  . potassium chloride SA  40 mEq Oral Daily  . rifampin  600 mg Oral Daily  . rosuvastatin  10 mg Oral q morning - 10a  . sodium chloride flush  3 mL Intravenous Q12H  . sulfamethoxazole-trimethoprim  1 tablet Oral BID  . tamsulosin  0.4 mg Oral BID   Continuous Infusions: . sodium chloride     PRN Meds:.sodium chloride, acetaminophen, fluticasone, HYDROcodone-homatropine,  levalbuterol, LORazepam, oxyCODONE-acetaminophen **AND** oxyCODONE, phenol, sodium chloride flush   SUBJECTIVE:      CONSTITUTIONAL: BP 106/70   Pulse 88   Temp 97.6 F (36.4 C) (Oral)   Resp 20   Ht 5\' 8"  (1.727 m)   Wt 78.3 kg   SpO2 (!) 80%   BMI 26.25 kg/m     I/O last 3 completed shifts: In: 81 [P.O.:720] Out: 1400 [Urine:1400]        PHYSICAL EXAM:  Hoarse bm nad  HEENT : pt wearing mask not removed for exam due to covid -19 concerns.    NECK :  without JVD/Nodes/TM/ nl carotid upstrokes bilaterally   LUNGS: no acc muscle use,  Mod barrel contour chest wall with bilateral  distant wheeze and  without cough on insp or exp maneuvers and mod  Hyperresonant  to  percussion bilaterally.    CV:  RRR  no s3 or murmur or increase in P2, and no edema   ABD:  soft and nontender with pos mid insp Hoover's  in the supine position. No bruits or organomegaly appreciated, bowel  sounds nl  MS:     ext warm without deformities, calf tenderness, cyanosis or clubbing No obvious joint restrictions   SKIN: warm and dry without lesions    NEURO:  alert, approp, nl sensorium with  no motor or cerebellar deficits apparent.          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 L chest  by CT 1/25  - very limited options for w/u or rx at this point but at least needs sputum for afb and fungus cultures check w/a    as this could be aspergilloma/ mycetoma with bronchogenic ca in the ddx as well  - so far only pos sputum is for sten multiphia rx with bactrim x 20 days then f/u as outpt  4) chronic / recurrent LE edema R>> L  -neg venous dopplers 1/15  so this is probably mild cor pulmonale with venous insff component causing the asymmetery   5) probable anemia of chronic dz    Lab Results  Component Value Date   HGB 9.9 (L) 08/12/2019   HGB 10.1 (L) 08/09/2019   HGB 9.8 (L) 08/08/2019    No gross GIB/ on PPi / continue to monitor   SUMMARY OF TODAY'S PLAN:  Wean steroids and 02 as tol     LABS  Glucose Recent Labs  Lab 08/11/19 0859 08/11/19 1248 08/11/19 1711 08/11/19 2228 08/12/19 0846 08/12/19 1251  GLUCAP 148* 239* 310* 286* 268* 165*    BMET Recent Labs  Lab 08/08/19 0614 08/08/19 0614 08/08/19 0828 08/09/19 0420 08/12/19 0711  NA 134*  --   --  136 135  K 4.1   --   --  3.9 4.2  CL 93*  --   --  93* 92*  CO2 28  --   --  30 33*  BUN 12  --   --  21* 17  CREATININE 0.96   < > 0.95 1.13 1.11  GLUCOSE 141*  --   --  256* 334*   < > = values in this interval not displayed.    Liver Enzymes Recent Labs  Lab 08/08/19 0614  AST 24  ALT 24  ALKPHOS 60  BILITOT 0.5  ALBUMIN 2.7*    Electrolytes Recent Labs  Lab 08/08/19 0614 08/09/19 0420 08/12/19 0711  CALCIUM 8.4* 8.4* 8.3*    CBC Recent Labs  Lab 08/08/19 0828 08/09/19 0420 08/12/19 0711  WBC 5.4 3.9* 6.0  HGB 9.8* 10.1* 9.9*  HCT 31.8* 33.0* 32.7*  PLT 238 268 284    ABG Recent Labs  Lab 08/08/19 0815 08/12/19 0500  PHART 7.404 7.439  PCO2ART 50.7* 47.6  PO2ART 117* 52.8*    Coag's No results for input(s): APTT, INR in the last 168 hours.  Sepsis Markers Recent Labs  Lab 08/08/19 0828  PROCALCITON <0.10    Cardiac Enzymes No results for input(s): TROPONINI, PROBNP in the last 168 hours.     We will see again first of week to arrange outpt f/u   Christinia Gully, MD [facility] [place] [phone] After 5:30 PM or weekends, use [facility] [phone]

## 2019-08-12 NOTE — Progress Notes (Signed)
PT Cancellation Note  Patient Details Name: Ryan [name] MRN: [id number] DOB: [birth year]   Cancelled Treatment:   Patient declined therapy secondary to c/o fatigue and difficulty breathing with exertion.   12:17 PM, 08/12/19 Lonell Grandchild, MPT Physical Therapist with [facility] [phone] office [phone] mobile phone

## 2019-08-12 NOTE — Progress Notes (Signed)
Patient Demographics:    Ryan [name], is a 59 y.o. male, DOB - [birth year], DJT:[id number]  Admit date - 08/08/2019   Admitting Physician Barton Dubois, MD  Outpatient Primary MD for the patient is [name], MD  LOS - 4   Chief Complaint  Patient presents with  . 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 male with past medical history relevant for chronic hypoxic respiratory failure on 6 L of oxygen at home, DM2, HTN, COPD, HFpEF/dCHF, HLD and GERD/Barret's, PAFib, MAI infection (chronically followed by ID and receiving antibiotics admitted on 08/08/2019 with worsening shortness of breath and cough, -worsening hypoxia, worsening bilateral lower extremity edema, dyspnea at rest and dyspnea on exertion--- found to have abnormal CT chest -Covid19 negative -Sputum for fungus and AFB in progress   A/P 1)Acute on chronic hypoxic respiratory failure--- suspect due to COPD exacerbation---pulm and infectious disease consult appreciated  -At baseline patient uses 6 L of nasal cannula  On admission was requiring high flow nasal  cannula, -Continue to wean down oxygen to his baseline (currently fluctuating between 8 to 10 L of oxygen per minute to keep O2 sats in the low 90s) -Continue bronchodilators (levoalbuterol preferred due to propensity towards tachycardia) and mucolytics -IV Solu-Medrol 40 mg every 12 hours -Patient's hypoxia is not new but his hypercapnia appears to be new -Be judicious with benzos and sedatives due to worsening hypoxia and "new" hypercapnia -Repeat ABG on 60% FiO2 on 08/12/2019 with hypoxia but resolved hypercapnia   2)HFpEF--last known EF 55 to 60%,  -Appears euvolemic and compensated at this time -Continue Lasix 40 mg twice daily -Pt Tells me  he is allergic to Ted stockings  3)H/o MAI with  abnormal CT chest finding---Thick-walled large cavitary lesion with internal debris in the posterior left hemithorax, markedly progressive from 09/02/2018. --- Differential diagnosis includes chronic infectious/inflammatory etiology, including mycetoma, can have this appearance. Malignancy cannot be excluded. -May be due to underlying MAI-- Cannot rule out ILD or malignancy or aspergilloma/ mycetoma -Sputum for AFB, Quant gold TB and sputum for fungus requested -Pulmonology consult appreciated -Continue azithromycin 500 mg daily, Clofazimine 100mg  daily  and rifampin 600 mg daily for MAI -On 08/12/2019 I had conference with pulmonologist Dr. [name] and infectious disease specialist Dr. Marcellina Millin pulmonologist and ID physician agreeable with current plan of care -Sputum 1/25  Mod wbc, rare GPCs in clusters>>> stenotrophomanas  maltophilia sensitive to bactrim, resistant to levaquin--plan is to treat Stenotrophomonas maltophilia with Bactrim DS 1 twice daily for 20 days with repeat sputum sample as outpatient in 3 weeks Quant GOLD TB 1/25  Neg  MRSA pcr  1/25  Neg Covid PCR 1/25 neg  Sputum AFB 1/25 >>> neg smear >>>  -Sputum AFB 1/27 ->>> Neg smear Sputum AFB 1/28>>>> pending Sputum fungus 1/25 >>>  yeast only on smear >>>--- okay to treat yeast with Diflucan   4)PAFib--- PTA was not on anticoagulation, tends to have RVR with bronchodilators -Continue Cardizem CD 120 mg daily for rate control   5)DM2-A1c is 8.3 reflecting uncontrolled diabetes, anticipate worsening hyperglycemia with steroids, increase Lantus insulin to 50  units daily along with sliding scale NovoLog  6)Mild Anemia--- hemoglobin is 10.1, asymptomatic ,  -Continue Protonix -Monitor H&H if H&H continues to drop consider inpatient GI consult otherwise deferred to outpatient as patient current cardiopulmonary status precludes endoluminal evaluation at this time  7)BPH --- stable, continue Flomax  8)Anxiety/Depression-stable, continue Celexa and lorazepam -Patient and his wife unhappy with my reluctance to escalate is benzos, opiates and sedatives, they both tell me that Dr. Luan Pulling used to be more "generous" with benzos and sedatives --I reminded them that we needed to be  judicious with benzos, opiates and sedatives due to worsening hypoxia and "new" hypercapnia  9)GERD/Barrett's --- continue Protonix 40 mg twice daily  10)Social/ethics--palliative care consult requested currently full code- --Patient speaks of his nostalgia about Dr. Luan Pulling , advised patient and his wife that Dr. Luan Pulling has retired -Patient continues to have a list of complaints and Grievances:- 1) he is unhappy about carb restricted diet--His  A1c is 8.3 and sugars are running over 300 due to steroids--patient states that Dr.  Luan Pulling used to give him what he ask for and cannot understand why I have him on a carb restricted diet -He took out his frustration on the kitchen staff who brought his lunch--- I again pleaded with patient and reminded him that kitchen staff cannot change his diet that physicians responsible for dietary orders  2) patient requesting that his narcotics and benzos be increased--- I explained to him that given his tenuous  respiratory status with increased oxygen requirement--it is recommended that his opiates and benzos not be escalated at this time especially given his hypercarbia on admission with risk for further CO2 retention--- patient tells me that Dr. Luan Pulling used to give him more pain medications -Patient's wife also requested that his pain medications and benzos be increased -I again explained the rationale for my reluctance to escalate his opiates and benzos at this time to both patient and his wife  3) patient states that in the past we have given him albuterol and almost killed him (apparently had tachyarrhythmia )--- he also states that he does not trust anybody but Dr. Luan Pulling -I again reminded patient that he is currently on Xopenex, also reminded him that Dr. Luan Pulling has retired  4) patient unhappy about being in airborne isolation room for TB rule out--- patient's wife requesting that Airbone isolation be discontinued -I again explained to both patient and his wife that the pulmonologist has recommended TB rule out pending AFB x3 - test we have to keep patient in airborne isolation -Patient remains unhappy about this  5)Patient is also unhappy about the timing and scheduling of his medications and meals/snacks --In summary patient tells me he has been coming here for 20 years and Dr. Luan Pulling typically do things  differently -Patient tells me that the hospital doctors including pulmonology team do not know what we are talking about -Patient remains unhappy despite multiple attempts to explain the rationale for the care provided  Disposition/Need for in-Hospital Stay- patient unable to be discharged at this time due to --- acute on chronic hypoxic respiratory failure requiring further stabilization--becomes very tachycardic and dyspneic with minimal activity with worsening Hypoxia  Code Status : Full  Family Communication:  (patient is alert, awake and coherent) -Discussed with his wife who is at  bedside (called pt's wife on the phone in pt's room)  Disposition Plan  : TBD after improvement in respiratory status  Consults  : Pulmonology/ID (Dr Linus Salmons)  DVT Prophylaxis  :  Lovenox - - SCDs   Lab Results  Component Value Date   PLT 284 08/12/2019    Inpatient Medications  Scheduled Meds: . arformoterol  15 mcg Nebulization BID  . azithromycin  500 mg Oral Daily  . benzonatate  200 mg Oral TID  . budesonide (PULMICORT) nebulizer solution  0.5 mg Nebulization BID  . Chlorhexidine Gluconate Cloth  6 each Topical Q0600  . citalopram  20 mg Oral Daily  . Clofazimine  100 mg Does not apply Daily  . dextromethorphan-guaiFENesin  2 tablet Oral BID  . diltiazem  120 mg Oral Daily  . enoxaparin (LOVENOX) injection  40 mg Subcutaneous Q24H  . feeding supplement (GLUCERNA SHAKE)  237 mL Oral BID BM  . fluconazole  200 mg Oral Daily  . furosemide  40 mg Oral BID  . insulin aspart  0-20 Units Subcutaneous TID WC  . insulin aspart  0-5 Units Subcutaneous QHS  . insulin aspart  6 Units Subcutaneous TID WC  . insulin glargine  50 Units Subcutaneous QHS  . ipratropium  0.5 mg Nebulization Q6H  . levalbuterol  0.63 mg Nebulization Q6H  . loratadine  10 mg Oral Daily  . mouth rinse  15 mL Mouth Rinse BID  . methylPREDNISolone (SOLU-MEDROL) injection  40 mg Intravenous Q12H  . montelukast  10 mg Oral Daily  . pantoprazole  40 mg Oral BID AC  . potassium chloride SA  40 mEq Oral Daily  . rifampin  600 mg Oral Daily  . rosuvastatin  10 mg Oral q morning - 10a  . sodium chloride flush  3 mL Intravenous Q12H  . sulfamethoxazole-trimethoprim  1 tablet Oral BID  . tamsulosin  0.4 mg Oral BID   Continuous Infusions: . sodium chloride     PRN Meds:.sodium chloride, acetaminophen, fluticasone, HYDROcodone-homatropine, levalbuterol, LORazepam, oxyCODONE-acetaminophen **AND** oxyCODONE, phenol, sodium chloride flush    Anti-infectives (From admission, onward)   Start     Dose/Rate Route  Frequency Ordered Stop   08/11/19 2200  sulfamethoxazole-trimethoprim (BACTRIM DS) 800-160 MG per tablet 1 tablet     1 tablet Oral 2 times daily 08/11/19 1903     08/11/19 1915  fluconazole (DIFLUCAN) tablet 200 mg     200 mg Oral Daily 08/11/19 1903     08/08/19 1200  azithromycin (ZITHROMAX) tablet 500 mg    Note to Pharmacy: 1 Tab daily     500 mg Oral Daily 08/08/19 0825     08/08/19 1200  rifampin (RIFADIN) capsule 600 mg    Note to Pharmacy: Long-term medication     600 mg Oral Daily 08/08/19 0825     08/08/19 1000  levofloxacin (LEVAQUIN) tablet 500 mg  Status:  Discontinued     500 mg Oral Daily 08/08/19  0825 08/08/19 1156   08/08/19 0715  azithromycin (ZITHROMAX) 500 mg in sodium chloride 0.9 % 250 mL IVPB  Status:  Discontinued     500 mg 250 mL/hr over 60 Minutes Intravenous Every 24 hours 08/08/19 0706 08/08/19 0828   08/08/19 0715  cefTRIAXone (ROCEPHIN) 1 g in sodium chloride 0.9 % 100 mL IVPB     1 g 200 mL/hr over 30 Minutes Intravenous  Once 08/08/19 0706 08/08/19 0836        Objective:   Vitals:   08/11/19 2232 08/12/19 0228 08/12/19 0824 08/12/19 1432  BP: 106/70     Pulse: 88     Resp: 20     Temp: 97.6 F (36.4 C)     TempSrc: Oral     SpO2: 99% 98% (!) 80% 96%  Weight:      Height:        Wt Readings from Last 3 Encounters:  08/10/19 78.3 kg  05/29/19 84.8 kg  01/10/19 85.3 kg     Intake/Output Summary (Last 24 hours) at 08/12/2019 1801 Last data filed at 08/11/2019 2100 Gross per 24 hour  Intake 240 ml  Output 300 ml  Net -60 ml     Physical Exam  Gen:- Awake Alert, some conversational dyspnea HEENT:- [place].AT, No sclera icterus Nose- [place] 10L/min Neck-Supple Neck,No JVD,.  Lungs-diminished in bases, scattered wheezing  CV- S1, S2 normal, irregularly irregular becomes very tachycardic with minimal activity  abd-  +ve B.Sounds, Abd Soft, No tenderness,    Extremity/Skin:-Chronic trace to +1 edema, pedal pulses present (RT >  Lt) Psych-affect is anxious, oriented x3 Neuro-no new focal deficits, no tremors   Data Review:   Micro Results Recent Results (from the past 240 hour(s))  Respiratory Panel by RT PCR (Flu A&B, Covid) - Nasopharyngeal Swab     Status: None   Collection Time: 08/08/19  6:16 AM   Specimen: Nasopharyngeal Swab  Result Value Ref Range Status   SARS Coronavirus 2 by RT PCR NEGATIVE NEGATIVE Final    Comment: (NOTE) SARS-CoV-2 target nucleic acids are NOT DETECTED. The SARS-CoV-2 RNA is generally detectable in upper respiratoy specimens during the acute phase of infection. The lowest concentration of SARS-CoV-2 viral copies this assay can detect is 131 copies/mL. A negative result does not preclude SARS-Cov-2 infection and should not be used as the sole basis for treatment or other patient management decisions. A negative result may occur with  improper specimen collection/handling, submission of specimen other than nasopharyngeal swab, presence of viral mutation(s) within the areas targeted by this assay, and inadequate number of viral copies (<131 copies/mL). A negative result must be combined with clinical observations, patient history, and epidemiological information. The expected result is Negative. Fact Sheet for Patients:  PinkCheek.be Fact Sheet for Healthcare Providers:  GravelBags.it This test is not yet ap proved or cleared by the [place] FDA and  has been authorized for detection and/or diagnosis of SARS-CoV-2 by FDA under an Emergency Use Authorization (EUA). This EUA will remain  in effect (meaning this test can be used) for the duration of the COVID-19 declaration under Section 564(b)(1) of the Act, 21 U.S.C. section 360bbb-3(b)(1), unless the authorization is terminated or revoked sooner.    Influenza A by PCR NEGATIVE NEGATIVE Final   Influenza B by PCR NEGATIVE NEGATIVE Final    Comment: (NOTE) The  Xpert Xpress SARS-CoV-2/FLU/RSV assay is intended as an aid in  the diagnosis of influenza from Nasopharyngeal swab specimens and  should  not be used as a sole basis for treatment. Nasal washings and  aspirates are unacceptable for Xpert Xpress SARS-CoV-2/FLU/RSV  testing. Fact Sheet for Patients: PinkCheek.be Fact Sheet for Healthcare Providers: GravelBags.it This test is not yet approved or cleared by the [place] FDA and  has been authorized for detection and/or diagnosis of SARS-CoV-2 by  FDA under an Emergency Use Authorization (EUA). This EUA will remain  in effect (meaning this test can be used) for the duration of the  Covid-19 declaration under Section 564(b)(1) of the Act, 21  U.S.C. section 360bbb-3(b)(1), unless the authorization is  terminated or revoked. Performed at [facility], [address] [place], [place] [address]   Culture, sputum-assessment     Status: None   Collection Time: 08/08/19  8:17 AM   Specimen: Expectorated Sputum  Result Value Ref Range Status   Specimen Description EXPECTORATED SPUTUM  Final   Special Requests NONE  Final   Sputum evaluation   Final    THIS SPECIMEN IS ACCEPTABLE FOR SPUTUM CULTURE Performed at [facility], [address] [place], [place] [address]    Report Status 08/08/2019 FINAL  Final  Culture, respiratory     Status: None   Collection Time: 08/08/19  8:17 AM  Result Value Ref Range Status   Specimen Description   Final    EXPECTORATED SPUTUM Performed at [facility], [address] [place], [place] [address]    Special Requests   Final    NONE Reflexed from [id number] Performed at [facility], [address] [place], [place] [address]    Gram Stain   Final    MODERATE WBC PRESENT,BOTH PMN AND MONONUCLEAR RARE GRAM POSITIVE COCCI IN CLUSTERS Performed at [facility], [place] [address] [place], [place] [address]    Culture FEW STENOTROPHOMONAS  MALTOPHILIA  Final   Report Status 08/11/2019 FINAL  Final   Organism ID, Bacteria STENOTROPHOMONAS MALTOPHILIA  Final      Susceptibility   Stenotrophomonas maltophilia - MIC*    LEVOFLOXACIN >=8 RESISTANT Resistant     TRIMETH/SULFA <=20 SENSITIVE Sensitive     * FEW STENOTROPHOMONAS MALTOPHILIA  Acid Fast Smear (AFB)     Status: None   Collection Time: 08/08/19  4:06 PM   Specimen: Sputum  Result Value Ref Range Status   AFB Specimen Processing Concentration  Final   Acid Fast Smear Negative  Final    Comment: (NOTE) Performed At: [facility] [address] [place], [place] [id number] Rush Farmer MD OX:[id number]    Source (AFB) SPUTUM  Final    Comment: Performed at [facility], [address] [place], [place] [address]  Fungus Culture With Stain     Status: Abnormal (Preliminary result)   Collection Time: 08/08/19  4:06 PM   Specimen: Sputum  Result Value Ref Range Status   Fungus Stain Final report (A)  Final    Comment: (NOTE) Performed At: [facility] [place], [place] [id number] Rush Farmer MD [id number]    Fungus (Mycology) Culture PENDING  Incomplete   Fungal Source SPUTUM  Final    Comment: Performed at [facility], [address], [place], [place] [address]  Fungus Culture Result     Status: Abnormal   Collection Time: 08/08/19  4:06 PM  Result Value Ref Range Status   Result 1 Comment (A)  Final    Comment: (NOTE) Pseudohyphae and yeasts observed Performed At: [facility] [address] [place], [place] [id number] Perlie Gold  Derinda Late MD VV:[id number]   MRSA PCR Screening     Status: None   Collection Time: 08/08/19  4:48 PM   Specimen: Nasopharyngeal  Result Value Ref Range Status   MRSA by PCR NEGATIVE NEGATIVE Final    Comment:        The GeneXpert MRSA Assay (FDA approved for NASAL specimens only), is one component of a comprehensive MRSA colonization surveillance program. It is not intended to  diagnose MRSA infection nor to guide or monitor treatment for MRSA infections. Performed at [facility], [address], [place], [place] [address]   Acid Fast Smear (AFB)     Status: None   Collection Time: 08/10/19  9:11 AM   Specimen: Sputum  Result Value Ref Range Status   AFB Specimen Processing Concentration  Final   Acid Fast Smear Negative  Final    Comment: (NOTE) Performed At: [facility] [address] [place], [place] [id number] Rush Farmer MD [id number]    Source (AFB) SPUTUM  Final    Comment: Performed at [facility], [address], [place], [place] [address]    Radiology Reports CT Chest High Resolution  Result Date: 08/08/2019 CLINICAL DATA:  Shortness of breath, productive cough. EXAM: CT CHEST WITHOUT CONTRAST TECHNIQUE: Multidetector CT imaging of the chest was performed following the standard protocol without intravenous contrast. High resolution imaging of the lungs, as well as inspiratory and expiratory imaging, was performed. COMPARISON:  09/02/2018, 09/01/2017 and 11/28/2015. FINDINGS: Cardiovascular: Atherosclerotic calcification of the aorta and coronary arteries. Pulmonic trunk is enlarged. Heart size normal. No pericardial effusion. Mediastinum/Nodes: No pathologically enlarged mediastinal or axillary lymph nodes. Hilar regions are difficult to evaluate without IV contrast. Esophagus is unremarkable. Lungs/Pleura: Image quality is degraded by respiratory motion. Severe bullous emphysema. Thick-walled cavity with internal debris in the posterior left hemithorax measures approximately 5.3 x 8.5 cm (5/43) and has enlarged significantly from prior exams. Dependent atelectasis in the left lower lobe. Lingular nodule measures 7 mm (5/88), unchanged from 09/01/2017 and considered benign. Trace left pleural fluid. Airway is unremarkable. Upper Abdomen: Visualized portions of the liver, adrenal glands, kidneys, spleen, pancreas, stomach and bowel are  grossly unremarkable with the exception of a small hiatal hernia. No upper abdominal adenopathy. Musculoskeletal: No worrisome lytic or sclerotic lesions. IMPRESSION: 1. Thick-walled large cavitary lesion with internal debris in the posterior left hemithorax, markedly progressive from 09/02/2018. A chronic infectious/inflammatory etiology, including mycetoma, can have this appearance. Malignancy cannot be excluded. 2. Trace left pleural effusion. 3. Significant respiratory motion degrades image quality. No findings worrisome for interstitial lung disease. 4. Aortic atherosclerosis (ICD10-I70.0). Coronary artery calcification. 5. Enlarged pulmonic trunk, indicative of pulmonary arterial hypertension. 6. Severe bullous Emphysema (ICD10-J43.9). Electronically Signed   By: Lorin Picket M.D.   On: 08/08/2019 10:04   US Venous Img Lower Bilateral (DVT)  Result Date: 08/08/2019 CLINICAL DATA:  Leg swelling x1 month. EXAM: BILATERAL LOWER EXTREMITY VENOUS DOPPLER ULTRASOUND TECHNIQUE: Gray-scale sonography with compression, as well as color and duplex ultrasound, were performed to evaluate the deep venous system(s) from the level of the common femoral vein through the popliteal and proximal calf veins. COMPARISON:  None. FINDINGS: VENOUS Normal compressibility of the common femoral, superficial femoral, and popliteal veins, as well as the visualized calf veins. Visualized portions of profunda femoral vein and great saphenous vein unremarkable. No filling defects to suggest DVT on grayscale or color Doppler imaging. Doppler waveforms show normal direction of venous flow, normal respiratory phasicity and response to augmentation.  Limited views of the contralateral common femoral vein are unremarkable. OTHER None. Limitations: none IMPRESSION: No femoropopliteal DVT nor evidence of DVT within the visualized calf veins. If clinical symptoms are inconsistent or if there are persistent or worsening symptoms, further  imaging (possibly involving the iliac veins) may be warranted. Electronically Signed   By: Virgina Norfolk M.D.   On: 08/08/2019 17:15   DG Chest Port 1 View  Result Date: 08/08/2019 CLINICAL DATA:  Increasing shortness of breath with productive cough EXAM: PORTABLE CHEST 1 VIEW COMPARISON:  Radiograph 05/04/2019, CT 09/02/2018 FINDINGS: Patchy opacities present in the right lung base as well as in the left infrahilar lung and periphery of the left upper lobe. Findings are on a background of diffuse biapical scarring and severe emphysematous changes with architectural distortion most pronounced in the left upper lung, better visualized on comparison cross-sectional imaging. Cardiomediastinal contours are similar to prior counting for differences in technique. Question some left pleural fluid tracking within the fissures. No clear pneumothorax is seen. Portion of the right costophrenic sulcus is collimated from view. No acute osseous or soft tissue abnormality. IMPRESSION: Patchy opacities in the right lung base as well as in the left infrahilar lung and periphery of the left upper lobe could reflect acute infectious consolidation. Background of severe emphysematous changes with widespread scarring and architectural distortion most pronounced in the left upper lung. Electronically Signed   By: Lovena Le M.D.   On: 08/08/2019 06:40                                                                                                                                                                                                                                            CBC Recent Labs  Lab 08/08/19 0614 08/08/19 0828 08/09/19 0420 08/12/19 0711  WBC 5.8 5.4 3.9* 6.0  HGB 9.9* 9.8* 10.1* 9.9*  HCT 32.0* 31.8* 33.0* 32.7*  PLT 243 238 268 284  MCV 82.7 83.0 83.3 83.2  MCH 25.6* 25.6* 25.5* 25.2*  MCHC 30.9 30.8 30.6 30.3  RDW 15.6* 15.7* 15.5 15.8*    Chemistries  Recent Labs  Lab 08/08/19 0614  08/08/19 0828 08/09/19 0420 08/12/19 0711  NA 134*  --  136 135  K 4.1  --  3.9 4.2  CL 93*  --  93* 92*  CO2 28  --  30 33*  GLUCOSE  141*  --  256* 334*  BUN 12  --  21* 17  CREATININE 0.96 0.95 1.13 1.11  CALCIUM 8.4*  --  8.4* 8.3*  AST 24  --   --   --   ALT 24  --   --   --   ALKPHOS 60  --   --   --   BILITOT 0.5  --   --   --    ------------------------------------------------------------------------------------------------------------------ No results for input(s): CHOL, HDL, LDLCALC, TRIG, CHOLHDL, LDLDIRECT in the last 72 hours.  Lab Results  Component Value Date   HGBA1C 8.3 (H) 08/08/2019   ------------------------------------------------------------------------------------------------------------------ No results for input(s): TSH, T4TOTAL, T3FREE, THYROIDAB in the last 72 hours.  Invalid input(s): FREET3 ------------------------------------------------------------------------------------------------------------------ No results for input(s): VITAMINB12, FOLATE, FERRITIN, TIBC, IRON, RETICCTPCT in the last 72 hours.  Coagulation profile No results for input(s): INR, PROTIME in the last 168 hours.  No results for input(s): DDIMER in the last 72 hours.  Cardiac Enzymes No results for input(s): CKMB, TROPONINI, MYOGLOBIN in the last 168 hours.  Invalid input(s): CK ------------------------------------------------------------------------------------------------------------------    Component Value Date/Time   BNP 63.0 08/08/2019 3893   Roxan Hockey M.D on 08/12/2019 at 6:01 PM  Go to [email or web address] - for contact info  [facility] - Office  [phone]

## 2019-08-13 LAB — GLUCOSE, CAPILLARY
Glucose-Capillary: 209 mg/dL — ABNORMAL HIGH (ref 70–99)
Glucose-Capillary: 315 mg/dL — ABNORMAL HIGH (ref 70–99)
Glucose-Capillary: 273 mg/dL — ABNORMAL HIGH (ref 70–99)
Glucose-Capillary: 126 mg/dL — ABNORMAL HIGH (ref 70–99)
Glucose-Capillary: 316 mg/dL — ABNORMAL HIGH (ref 70–99)

## 2019-08-13 NOTE — Progress Notes (Signed)
Patient Demographics:    Ryan [name], is a 59 y.o. male, DOB - 1961/06/19, UXY:[id number]  Admit date - 08/08/2019   Admitting Physician Barton Dubois, MD  Outpatient Primary MD for the patient is [name], MD  LOS - 5   Chief Complaint  Patient presents with   Shortness of Breath        Subjective:    [name] today has no fevers, No chest pain,    - Patient said he slept poorly, requesting increasing opiates and benzos--- -I again told him that I am reluctant to do this, patient remains unhappy -Cough is improving   Assessment  & Plan :    Principal Problem:   Cavitary lesion of lung Active Problems:   COPD exacerbation ([place])   MAI (mycobacterium avium-intracellulare) ([place])   Acute respiratory failure with hypoxia and hypercapnia ([place])   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 male with past medical history relevant for chronic hypoxic respiratory failure on 6 L of oxygen at home, DM2, HTN, COPD, HFpEF/dCHF, HLD and GERD/Barret's, PAFib, MAI infection (chronically followed by ID and receiving antibiotics admitted on 08/08/2019 with worsening shortness of breath and cough, -worsening hypoxia, worsening bilateral lower extremity edema, dyspnea at rest and dyspnea on exertion--- found to have abnormal CT chest -Covid19 negative -Sputum for fungus and AFB in progress -possible discharge in 1 to 2 days if continues to improve from a respiratory standpoint   A/P 1)Acute on chronic hypoxic respiratory failure--- suspect due to COPD exacerbation---pulm and infectious disease consult appreciated  -Hypercapnia on admission noted repeat ABG shows improvement in pCO2 -At baseline  patient uses 6 L of nasal cannula  On admission was requiring high flow nasal cannula, -Continue to wean down oxygen to his baseline (currently fluctuating between 8 to 10 L of oxygen per minute to keep O2 sats in the low 90s) -Continue bronchodilators (levoalbuterol preferred due to propensity towards tachycardia) and mucolytics -Continue IV Solu-Medrol 40 mg every 12 hours - -Be judicious with benzos and sedatives due to worsening hypoxia and "new" hypercapnia -Repeat ABG on 60% FiO2 on 08/12/2019 with hypoxia but resolved hypercapnia   2)HFpEF--last known EF 55 to 60%,  -Appears euvolemic and compensated at this time -Continue Lasix 40 mg twice daily -Pt Tells me  he is allergic to Ted stockings -After much persuasion is agreeable to try Ace wraps for lower extremities  3)H/o MAI with  abnormal CT chest finding---Thick-walled large cavitary lesion with internal debris in the posterior left hemithorax, markedly progressive from 09/02/2018. --- Differential diagnosis includes chronic infectious/inflammatory etiology, including mycetoma, can have this appearance. Malignancy cannot be excluded. -May be due to underlying MAI-- Cannot rule out ILD or malignancy or aspergilloma/ mycetoma -Sputum for AFB, Quant gold TB and sputum for fungus requested -Pulmonology consult appreciated -Continue azithromycin 500 mg daily, Clofazimine 100mg  daily  and rifampin 600 mg daily for MAI -On 08/12/2019 I had conference with pulmonologist Dr. [name] and infectious disease specialist Dr. Marcellina Millin pulmonologist and ID  physician agreeable with current plan of care -Sputum 1/25  Mod wbc, rare GPCs in clusters>>> stenotrophomanas maltophilia sensitive to bactrim, resistant to levaquin--plan is to treat Stenotrophomonas maltophilia with Bactrim DS 1 twice daily for 20 days with repeat sputum sample as outpatient in 3 weeks Quant GOLD TB 1/25  Neg  MRSA pcr  1/25  Neg Covid PCR 1/25 neg  Sputum AFB 1/25 >>> neg  smear >>>  -Sputum AFB 1/27 ->>> Neg smear Sputum AFB 1/28>>>> pending Sputum fungus 1/25 >>> yeast only on smear >>>--- okay to treat yeast with Diflucan   4)PAFib--- PTA was not on anticoagulation, tends to have RVR with bronchodilators -Continue Cardizem CD 120 mg daily for rate control   5)DM2-A1c is 8.3 reflecting uncontrolled diabetes, anticipate worsening hyperglycemia with steroids, increase Lantus insulin to 50  units daily along with sliding scale NovoLog  6)Mild Anemia--- hemoglobin is 10.1, asymptomatic ,  -Continue Protonix -Monitor H&H if H&H continues to drop consider inpatient GI consult otherwise deferred to outpatient as patient current cardiopulmonary status precludes endoluminal evaluation at this time  7)BPH --- stable, continue Flomax  8)Anxiety/Depression-stable, continue Celexa and lorazepam -Patient and his wife unhappy with my reluctance to escalate is benzos, opiates and sedatives, they both tell me that Dr. Luan Pulling used to be more "generous" with benzos and sedatives --I reminded them that we needed to be  judicious with benzos, opiates and sedatives due to worsening hypoxia and "new" hypercapnia  9)GERD/Barrett's --- continue Protonix 40 mg twice daily  10)Social/ethics--palliative care consult requested currently full code- --Patient speaks of his nostalgia about Dr. Luan Pulling , advised patient and his wife that Dr. Luan Pulling has retired -Patient continues to have a list of complaints and Grievances:- 1) he is unhappy about carb restricted diet--His  A1c is 8.3 and sugars are running over 300 due to steroids--patient states that Dr.  Luan Pulling used to give him what he ask for and cannot understand why I have him on a carb restricted diet -He took out his frustration on the kitchen staff who brought his lunch--- I again pleaded with patient and reminded him that kitchen staff cannot change his diet that physicians responsible for dietary orders  2) patient  requesting that his narcotics and benzos be increased--- I explained to him that given his tenuous respiratory status with increased oxygen requirement--it is recommended that his opiates and benzos not be escalated at this time especially given his hypercarbia on admission with risk for further CO2 retention--- patient tells me that Dr. Luan Pulling used to give him more pain medications -Patient's wife also requested that his pain medications and benzos be increased -I again explained the rationale for my reluctance to escalate his opiates and benzos at this time to both patient and his wife  3) patient states that in the past we have given him albuterol and almost killed him (apparently had tachyarrhythmia )--- he also states that he does not trust anybody but Dr. Luan Pulling -I again reminded patient that he is currently on Xopenex, also reminded him that Dr. Luan Pulling has retired  4) patient unhappy about being in airborne isolation room for TB rule out--- patient's wife requesting that Airbone isolation be discontinued -I again explained to both patient and his wife that the pulmonologist has recommended TB rule out pending AFB x3 - test we have to keep patient in airborne isolation -Patient remains unhappy about this  5)Patient is also unhappy about the timing and scheduling of his medications and meals/snacks --In summary  patient tells me he has been coming here for 20 years and Dr. Luan Pulling typically do things differently -Patient tells me that the hospital doctors including pulmonology team do not know what we are talking about -Patient remains unhappy despite multiple attempts to explain the rationale for the care provided  --Patient continues to make "inappropriate", sexually suggestive comments towards male staff (told RN that if she is tired is because she has been running through his mind all day, says he wishes that they did not have to have a gown and PPE on so he can see their scrubs  better   Disposition/Need for in-Hospital Stay- patient unable to be discharged at this time due to --- acute on chronic hypoxic respiratory failure requiring further stabilization--becomes very tachycardic and dyspneic with minimal activity with worsening Hypoxia  - possible discharge in 1 to 2 days if continues to improve from a respiratory standpoint  Code Status : Full  Family Communication:  (patient is alert, awake and coherent) -Discussed with his wife who is at bedside (called pt's wife on the phone in pt's room)  Disposition Plan  : TBD after improvement in respiratory status--- possible discharge in 1 to 2 days if continues to improve from a respiratory standpoint  Consults  : Pulmonology/ID (Dr Linus Salmons)  DVT Prophylaxis  :  Lovenox - - SCDs   Lab Results  Component Value Date   PLT 284 08/12/2019    Inpatient Medications  Scheduled Meds:  arformoterol  15 mcg Nebulization BID   azithromycin  500 mg Oral Daily   benzonatate  200 mg Oral TID   budesonide (PULMICORT) nebulizer solution  0.5 mg Nebulization BID   Chlorhexidine Gluconate Cloth  6 each Topical Q0600   citalopram  20 mg Oral Daily   Clofazimine  100 mg Does not apply Daily   dextromethorphan-guaiFENesin  2 tablet Oral BID   diltiazem  120 mg Oral Daily   enoxaparin (LOVENOX) injection  40 mg Subcutaneous Q24H   feeding supplement (GLUCERNA SHAKE)  237 mL Oral BID BM   fluconazole  200 mg Oral Daily   furosemide  40 mg Oral BID   insulin aspart  0-20 Units Subcutaneous TID WC   insulin aspart  0-5 Units Subcutaneous QHS   insulin aspart  6 Units Subcutaneous TID WC   insulin glargine  50 Units Subcutaneous QHS   ipratropium  0.5 mg Nebulization Q6H   levalbuterol  0.63 mg Nebulization Q6H   loratadine  10 mg Oral Daily   mouth rinse  15 mL Mouth Rinse BID   methylPREDNISolone (SOLU-MEDROL) injection  40 mg Intravenous Q12H   montelukast  10 mg Oral Daily   pantoprazole  40 mg  Oral BID AC   potassium chloride SA  40 mEq Oral Daily   rifampin  600 mg Oral Daily   rosuvastatin  10 mg Oral q morning - 10a   sodium chloride flush  3 mL Intravenous Q12H   sulfamethoxazole-trimethoprim  1 tablet Oral BID   tamsulosin  0.4 mg Oral BID   Continuous Infusions:  sodium chloride     PRN Meds:.sodium chloride, acetaminophen, fluticasone, HYDROcodone-homatropine, levalbuterol, LORazepam, oxyCODONE-acetaminophen **AND** oxyCODONE, phenol, sodium chloride flush    Anti-infectives (From admission, onward)   Start     Dose/Rate Route Frequency Ordered Stop   08/11/19 2200  sulfamethoxazole-trimethoprim (BACTRIM DS) 800-160 MG per tablet 1 tablet     1 tablet Oral 2 times daily 08/11/19 1903     08/11/19 1915  fluconazole (DIFLUCAN) tablet 200 mg     200 mg Oral Daily 08/11/19 1903     08/08/19 1200  azithromycin (ZITHROMAX) tablet 500 mg    Note to Pharmacy: 1 Tab daily     500 mg Oral Daily 08/08/19 0825     08/08/19 1200  rifampin (RIFADIN) capsule 600 mg    Note to Pharmacy: Long-term medication     600 mg Oral Daily 08/08/19 0825     08/08/19 1000  levofloxacin (LEVAQUIN) tablet 500 mg  Status:  Discontinued     500 mg Oral Daily 08/08/19 0825 08/08/19 1156   08/08/19 0715  azithromycin (ZITHROMAX) 500 mg in sodium chloride 0.9 % 250 mL IVPB  Status:  Discontinued     500 mg 250 mL/hr over 60 Minutes Intravenous Every 24 hours 08/08/19 0706 08/08/19 0828   08/08/19 0715  cefTRIAXone (ROCEPHIN) 1 g in sodium chloride 0.9 % 100 mL IVPB     1 g 200 mL/hr over 30 Minutes Intravenous  Once 08/08/19 0706 08/08/19 0836        Objective:   Vitals:   08/13/19 1041 08/13/19 1045 08/13/19 1410 08/13/19 1414  BP: 107/61 107/61 134/75   Pulse: 85  86   Resp: 20  20   Temp: 98.3 F (36.8 C)     TempSrc: Oral     SpO2: 93%  100% 99%  Weight:      Height:        Wt Readings from Last 3 Encounters:  08/10/19 78.3 kg  05/29/19 84.8 kg  01/10/19 85.3 kg      Intake/Output Summary (Last 24 hours) at 08/13/2019 1758 Last data filed at 08/13/2019 1410 Gross per 24 hour  Intake 1430 ml  Output 2550 ml  Net -1120 ml     Physical Exam  Gen:- Awake Alert, dyspnea on exertion persist, able to speak in sentences at rest HEENT:- [place].AT, No sclera icterus Nose- [place] 8L/min Neck-Supple Neck,No JVD,.  Lungs- Air movement is somewhat improving , much less wheezing  CV- S1, S2 normal, irregularly irregular becomes very tachycardic with minimal activity  abd-  +ve B.Sounds, Abd Soft, No tenderness,    Extremity/Skin:-Chronic trace to +1 edema, pedal pulses present (RT > Lt) Psych-affect is anxious from time to time, oriented x3 Neuro-no new focal deficits, no tremors   Data Review:   Micro Results Recent Results (from the past 240 hour(s))  Respiratory Panel by RT PCR (Flu A&B, Covid) - Nasopharyngeal Swab     Status: None   Collection Time: 08/08/19  6:16 AM   Specimen: Nasopharyngeal Swab  Result Value Ref Range Status   SARS Coronavirus 2 by RT PCR NEGATIVE NEGATIVE Final    Comment: (NOTE) SARS-CoV-2 target nucleic acids are NOT DETECTED. The SARS-CoV-2 RNA is generally detectable in upper respiratoy specimens during the acute phase of infection. The lowest concentration of SARS-CoV-2 viral copies this assay can detect is 131 copies/mL. A negative result does not preclude SARS-Cov-2 infection and should not be used as the sole basis for treatment or other patient management decisions. A negative result may occur with  improper specimen collection/handling, submission of specimen other than nasopharyngeal swab, presence of viral mutation(s) within the areas targeted by this assay, and inadequate number of viral copies (<131 copies/mL). A negative result must be combined with clinical observations, patient history, and epidemiological information. The expected result is Negative. Fact Sheet for Patients:   PinkCheek.be Fact Sheet for Healthcare Providers:  GravelBags.it This  test is not yet ap proved or cleared by the [place] and  has been authorized for detection and/or diagnosis of SARS-CoV-2 by FDA under an Emergency Use Authorization (EUA). This EUA will remain  in effect (meaning this test can be used) for the duration of the COVID-19 declaration under Section 564(b)(1) of the Act, 21 U.S.C. section 360bbb-3(b)(1), unless the authorization is terminated or revoked sooner.    Influenza A by PCR NEGATIVE NEGATIVE Final   Influenza B by PCR NEGATIVE NEGATIVE Final    Comment: (NOTE) The Xpert Xpress SARS-CoV-2/FLU/RSV assay is intended as an aid in  the diagnosis of influenza from Nasopharyngeal swab specimens and  should not be used as a sole basis for treatment. Nasal washings and  aspirates are unacceptable for Xpert Xpress SARS-CoV-2/FLU/RSV  testing. Fact Sheet for Patients: PinkCheek.be Fact Sheet for Healthcare Providers: GravelBags.it This test is not yet approved or cleared by the [place] FDA and  has been authorized for detection and/or diagnosis of SARS-CoV-2 by  FDA under an Emergency Use Authorization (EUA). This EUA will remain  in effect (meaning this test can be used) for the duration of the  Covid-19 declaration under Section 564(b)(1) of the Act, 21  U.S.C. section 360bbb-3(b)(1), unless the authorization is  terminated or revoked. Performed at [facility], [address] [place], [place] [address]   Culture, sputum-assessment     Status: None   Collection Time: 08/08/19  8:17 AM   Specimen: Expectorated Sputum  Result Value Ref Range Status   Specimen Description EXPECTORATED SPUTUM  Final   Special Requests NONE  Final   Sputum evaluation   Final    THIS SPECIMEN IS ACCEPTABLE FOR SPUTUM CULTURE Performed at [facility], [address], [place], [place] [address]    Report Status 08/08/2019 FINAL  Final  Culture, respiratory     Status: None   Collection Time: 08/08/19  8:17 AM  Result Value Ref Range Status   Specimen Description   Final    EXPECTORATED SPUTUM Performed at [facility], [address], [place], [place] [address]    Special Requests   Final    NONE Reflexed from [id number] Performed at [facility], [address], [place], [place] [address]    Gram Stain   Final    MODERATE WBC PRESENT,BOTH PMN AND MONONUCLEAR RARE GRAM POSITIVE COCCI IN CLUSTERS Performed at [facility], [place] [address] [place], [place] [address]    Culture FEW STENOTROPHOMONAS MALTOPHILIA  Final   Report Status 08/11/2019 FINAL  Final   Organism ID, Bacteria STENOTROPHOMONAS MALTOPHILIA  Final      Susceptibility   Stenotrophomonas maltophilia - MIC*    LEVOFLOXACIN >=8 RESISTANT Resistant     TRIMETH/SULFA <=20 SENSITIVE Sensitive     * FEW STENOTROPHOMONAS MALTOPHILIA  Acid Fast Smear (AFB)     Status: None   Collection Time: 08/08/19  4:06 PM   Specimen: Sputum  Result Value Ref Range Status   AFB Specimen Processing Concentration  Final   Acid Fast Smear Negative  Final    Comment: (NOTE) Performed At: [facility] [place], [place] [id number] Rush Farmer MD [id number]    Source (AFB) SPUTUM  Final    Comment: Performed at [facility], [address], [place], [place] [address]  Fungus Culture With Stain     Status: Abnormal (Preliminary result)   Collection Time: 08/08/19  4:06 PM   Specimen: Sputum  Result  Value Ref Range Status   Fungus Stain Final report (A)  Final    Comment: (NOTE) Performed At: [facility] [place], [place] [id number] Rush Farmer MD [id number]    Fungus (Mycology) Culture PENDING  Incomplete   Fungal Source SPUTUM  Final    Comment: Performed at [facility], [address] [place], [place] [address]  Fungus  Culture Result     Status: Abnormal   Collection Time: 08/08/19  4:06 PM  Result Value Ref Range Status   Result 1 Comment (A)  Final    Comment: (NOTE) Pseudohyphae and yeasts observed Performed At: [facility] [place], [place] [id number] Rush Farmer MD [id number]   MRSA PCR Screening     Status: None   Collection Time: 08/08/19  4:48 PM   Specimen: Nasopharyngeal  Result Value Ref Range Status   MRSA by PCR NEGATIVE NEGATIVE Final    Comment:        The GeneXpert MRSA Assay (FDA approved for NASAL specimens only), is one component of a comprehensive MRSA colonization surveillance program. It is not intended to diagnose MRSA infection nor to guide or monitor treatment for MRSA infections. Performed at [facility], [address], [place], [place] [address]   Acid Fast Smear (AFB)     Status: None   Collection Time: 08/10/19  9:11 AM   Specimen: Sputum  Result Value Ref Range Status   AFB Specimen Processing Concentration  Final   Acid Fast Smear Negative  Final    Comment: (NOTE) Performed At: [facility] [address] [place], [place] [id number] Rush Farmer MD [id number]    Source (AFB) SPUTUM  Final    Comment: Performed at [facility], [address], [place], [place] [address]  Acid Fast Smear (AFB)     Status: None   Collection Time: 08/11/19 12:37 PM   Specimen: Sputum  Result Value Ref Range Status   AFB Specimen Processing Concentration  Final   Acid Fast Smear Negative  Final    Comment: (NOTE) Performed At: [facility] [place], [place] [id number] Rush Farmer MD [id number]    Source (AFB) SPUTUM  Final    Comment: Performed at [facility], [address], [place], [place] [address]    Radiology Reports CT Chest High Resolution  Result Date: 08/08/2019 CLINICAL DATA:  Shortness of breath, productive cough. EXAM: CT CHEST WITHOUT CONTRAST TECHNIQUE: Multidetector CT imaging of  the chest was performed following the standard protocol without intravenous contrast. High resolution imaging of the lungs, as well as inspiratory and expiratory imaging, was performed. COMPARISON:  09/02/2018, 09/01/2017 and 11/28/2015. FINDINGS: Cardiovascular: Atherosclerotic calcification of the aorta and coronary arteries. Pulmonic trunk is enlarged. Heart size normal. No pericardial effusion. Mediastinum/Nodes: No pathologically enlarged mediastinal or axillary lymph nodes. Hilar regions are difficult to evaluate without IV contrast. Esophagus is unremarkable. Lungs/Pleura: Image quality is degraded by respiratory motion. Severe bullous emphysema. Thick-walled cavity with internal debris in the posterior left hemithorax measures approximately 5.3 x 8.5 cm (5/43) and has enlarged significantly from prior exams. Dependent atelectasis in the left lower lobe. Lingular nodule measures 7 mm (5/88), unchanged from 09/01/2017 and considered benign. Trace left pleural fluid. Airway is unremarkable. Upper Abdomen: Visualized portions of the liver, adrenal glands, kidneys, spleen, pancreas, stomach and bowel are grossly unremarkable with the exception of a small hiatal hernia. No upper abdominal adenopathy. Musculoskeletal: No worrisome lytic or sclerotic lesions. IMPRESSION: 1. Thick-walled large  cavitary lesion with internal debris in the posterior left hemithorax, markedly progressive from 09/02/2018. A chronic infectious/inflammatory etiology, including mycetoma, can have this appearance. Malignancy cannot be excluded. 2. Trace left pleural effusion. 3. Significant respiratory motion degrades image quality. No findings worrisome for interstitial lung disease. 4. Aortic atherosclerosis (ICD10-I70.0). Coronary artery calcification. 5. Enlarged pulmonic trunk, indicative of pulmonary arterial hypertension. 6. Severe bullous Emphysema (ICD10-J43.9). Electronically Signed   By: Lorin Picket M.D.   On: 08/08/2019 10:04     US Venous Img Lower Bilateral (DVT)  Result Date: 08/08/2019 CLINICAL DATA:  Leg swelling x1 month. EXAM: BILATERAL LOWER EXTREMITY VENOUS DOPPLER ULTRASOUND TECHNIQUE: Gray-scale sonography with compression, as well as color and duplex ultrasound, were performed to evaluate the deep venous system(s) from the level of the common femoral vein through the popliteal and proximal calf veins. COMPARISON:  None. FINDINGS: VENOUS Normal compressibility of the common femoral, superficial femoral, and popliteal veins, as well as the visualized calf veins. Visualized portions of profunda femoral vein and great saphenous vein unremarkable. No filling defects to suggest DVT on grayscale or color Doppler imaging. Doppler waveforms show normal direction of venous flow, normal respiratory phasicity and response to augmentation. Limited views of the contralateral common femoral vein are unremarkable. OTHER None. Limitations: none IMPRESSION: No femoropopliteal DVT nor evidence of DVT within the visualized calf veins. If clinical symptoms are inconsistent or if there are persistent or worsening symptoms, further imaging (possibly involving the iliac veins) may be warranted. Electronically Signed   By: Virgina Norfolk M.D.   On: 08/08/2019 17:15   DG Chest Port 1 View  Result Date: 08/08/2019 CLINICAL DATA:  Increasing shortness of breath with productive cough EXAM: PORTABLE CHEST 1 VIEW COMPARISON:  Radiograph 05/04/2019, CT 09/02/2018 FINDINGS: Patchy opacities present in the right lung base as well as in the left infrahilar lung and periphery of the left upper lobe. Findings are on a background of diffuse biapical scarring and severe emphysematous changes with architectural distortion most pronounced in the left upper lung, better visualized on comparison cross-sectional imaging. Cardiomediastinal contours are similar to prior counting for differences in technique. Question some left pleural fluid tracking within the  fissures. No clear pneumothorax is seen. Portion of the right costophrenic sulcus is collimated from view. No acute osseous or soft tissue abnormality. IMPRESSION: Patchy opacities in the right lung base as well as in the left infrahilar lung and periphery of the left upper lobe could reflect acute infectious consolidation. Background of severe emphysematous changes with widespread scarring and architectural distortion most pronounced in the left upper lung. Electronically Signed   By: Lovena Le M.D.   On: 08/08/2019 06:40  CBC Recent Labs  Lab 08/08/19 0614 08/08/19 0828 08/09/19 0420 08/12/19 0711  WBC 5.8 5.4 3.9* 6.0  HGB 9.9* 9.8* 10.1* 9.9*  HCT 32.0* 31.8* 33.0* 32.7*  PLT 243 238 268 284  MCV 82.7 83.0 83.3 83.2  MCH 25.6* 25.6* 25.5* 25.2*  MCHC 30.9 30.8 30.6 30.3  RDW 15.6* 15.7* 15.5 15.8*    Chemistries  Recent Labs  Lab 08/08/19 0614 08/08/19 0828 08/09/19 0420 08/12/19 0711  NA 134*  --  136 135  K 4.1  --  3.9 4.2  CL 93*  --  93* 92*  CO2 28  --  30 33*  GLUCOSE 141*  --  256* 334*  BUN 12  --  21* 17  CREATININE 0.96 0.95 1.13 1.11  CALCIUM 8.4*  --  8.4* 8.3*  AST 24  --   --   --   ALT 24  --   --   --   ALKPHOS 60  --   --   --   BILITOT 0.5  --   --   --    ------------------------------------------------------------------------------------------------------------------ No results for input(s): CHOL, HDL, LDLCALC, TRIG, CHOLHDL, LDLDIRECT in the last 72 hours.  Lab Results  Component Value Date   HGBA1C 8.3 (H) 08/08/2019   ------------------------------------------------------------------------------------------------------------------ No results for input(s): TSH, T4TOTAL, T3FREE, THYROIDAB in the last 72 hours.  Invalid input(s):  FREET3 ------------------------------------------------------------------------------------------------------------------ No results for input(s): VITAMINB12, FOLATE, FERRITIN, TIBC, IRON, RETICCTPCT in the last 72 hours.  Coagulation profile No results for input(s): INR, PROTIME in the last 168 hours.  No results for input(s): DDIMER in the last 72 hours.  Cardiac Enzymes No results for input(s): CKMB, TROPONINI, MYOGLOBIN in the last 168 hours.  Invalid input(s): CK ------------------------------------------------------------------------------------------------------------------    Component Value Date/Time   BNP 63.0 08/08/2019 2336   Roxan Hockey M.D on 08/13/2019 at 5:58 PM  Go to [email or web address] - for contact info  [facility] - Office  [phone]

## 2019-08-14 LAB — GLUCOSE, CAPILLARY
Glucose-Capillary: 372 mg/dL — ABNORMAL HIGH (ref 70–99)
Glucose-Capillary: 134 mg/dL — ABNORMAL HIGH (ref 70–99)

## 2019-08-14 MED ORDER — PREDNISONE 10 MG PO TABS: 10.0000 mg | ORAL_TABLET | ORAL | 1 refills | Status: AC

## 2019-08-14 MED ORDER — SULFAMETHOXAZOLE-TRIMETHOPRIM 800-160 MG PO TABS: 1.0000 | ORAL_TABLET | Freq: Two times a day (BID) | ORAL | 0 refills | Status: AC

## 2019-08-14 MED ORDER — POTASSIUM CHLORIDE CRYS ER 20 MEQ PO TBCR: EXTENDED_RELEASE_TABLET | ORAL | 2 refills | Status: AC

## 2019-08-14 NOTE — Discharge Summary (Signed)
Ryan [name], is a 59 y.o. male  DOB [birth year]  MRN [id number].  Admission date:  08/08/2019  Admitting Physician  Barton Dubois, MD  Discharge Date:  08/14/2019   Primary MD  [name], MD  Recommendations for primary care physician for things to follow:   1) prednisone taper as prescribed ---take 40 mg with breakfast daily for 4 days then 30 mg daily for 4 days then 20 mg daily for 4 days, then 10 mg daily indefinitely-always take with food 2) please reduce potassium chloride supplements to 20 mEq once a day from twice a day while you are on Bactrim to avoid your potassium being too high 3) please check BMP blood test every Wednesday starting 08/17/2019 while you are on Bactrim as Bactrim can affect your kidney function and your potassium levels  4)Please Follow up with pulmonologist Dr. Christinia Gully as outpatient in about 2 to 3  weeks for recheck and reevaluation -Christinia Gully, MD at [facility] Address: [address], [place], [place] [address] Phone: [phone] 5) outpatient follow-up with your infectious disease specialist Dr,.Comer in about 3 weeks or so advised, you probably need a repeat sputum test 6)Avoid ibuprofen/Advil/Aleve/Motrin/Goody Powders/Naproxen/BC powders/Meloxicam/Diclofenac/Indomethacin and other Nonsteroidal anti-inflammatory medications as these will make you more likely to bleed and can cause stomach ulcers, can also cause Kidney problems.   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 Atrial fibrillation ([place])    Not anticoagulated  . Barrett's esophagus   . Borderline diabetes   . Bronchitis   . CAP (community acquired pneumonia) 07/10/2013   04/2015  . 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 Chronic back pain   . Chronic diastolic CHF (congestive heart failure) ([place])   . Chronic left shoulder pain   . Chronic neck pain   . Chronic respiratory failure ([place])   . Collagen vascular disease ([place])   . COPD (chronic obstructive pulmonary disease) ([place])   . Coronary atherosclerosis of native coronary artery    Mild nonobstructive disease at catheterization 2007  . DDD (degenerative disc disease)    Cervical and thoracic  . Diastolic heart failure ([place])   . GERD (gastroesophageal reflux disease)   . History of pneumonia   .  Hypercholesteremia   . On home O2    6L N/C  . Polycythemia   . Seizures ([place])    Last seizure 2 yrs ago  . Smoker   . Type 2 diabetes mellitus ([place])   . Type 2 diabetes mellitus without complication [facility])     Past Surgical History:  Procedure Laterality Date  . COLONOSCOPY  2012   Dr. Posey Pronto: normal  . ESOPHAGOGASTRODUODENOSCOPY N/A 05/04/2015   Dr. Michail Sermon: minimal erosive esophagitis, small hiatal hernia. FOOD PRECLUDED A COMPLETE EXAM. No biopsies taken  . ESOPHAGOGASTRODUODENOSCOPY  2011   [place]: Barrett's   . ESOPHAGOGASTRODUODENOSCOPY (EGD) WITH PROPOFOL N/A 05/06/2018   Dr. Gala Romney: erosive reflux esophagitis and +candida, Schatzki's ring s/p dilation, hiatal hernia.  [name] LUNG BIOPSY    . MALONEY  DILATION N/A 05/06/2018   Procedure: Venia Minks DILATION;  Surgeon: Daneil Dolin, MD;  Location: [facility];  Service: Endoscopy;  Laterality: N/A;  . Throat biopsy    . VASECTOMY    . VASECTOMY  1987     HPI  from the history and physical done on the day of admission:    Chief Complaint: Productive cough, worsening shortness of breath and hypoxia  HPI: Ryan [name] is a 59 y.o. male with extensive past medical history including atrial fibrillation, chronic diastolic heart failure, COPD with chronic respiratory failure (at baseline using 6 L nasal cannula supplementation), gastroesophageal reflux disease/Barrett's esophagus, hyperlipidemia, type 2 diabetes mellitus and MAI infection (chronically followed by ID and receiving antibiotics for it); who presented to the emergency department secondary to worsening shortness of breath, hypoxia and increased wheezing.  Patient reports that for the last 4 days her breathing has decompensates and he has required higher levels oxygen at home.  He reports no fever, no chills, no chest pain, no nausea, no vomiting, no abdominal pain.  Positive intermittent productive coughing spells appreciated despite increasing his oxygen to 10 L experience.  Limited exertion capability and noted his O2 sat to be below 80%. In the ED patient required initially nonrebreather mask and subsequently high flow nasal cannula supplementation 15 L; chest x-ray 8 demonstrating diffuse emphysematous changes and bronchitic changes.  Negative Covid test; normal WBCs and otherwise stable vital signs.  Patient receiving nebulizer treatment, started on IV Solu-Medrol and TRH consulted to admit patient for further evaluation and management.  ABG and CT scan of the chest were ordered.   Hospital Course:   Brief Summary:- [name] a58 y.o.malewith past medical history relevant for chronic hypoxic respiratory failure on 6 L of oxygen at home, DM2, HTN, COPD, HFpEF/dCHF, HLD and  GERD/Barret's, PAFib, MAIinfection (chronically followed by ID and receiving antibiotics admitted on 08/08/2019 with worsening shortness of breath and cough, -worsening hypoxia, worsening bilateral lower extremity edema, dyspnea at rest and dyspnea on exertion--- found to have abnormal CT chest -Covid19 negative -Sputum for fungus  and AFB sent    A/P 1)Acute on chronic hypoxic respiratory failure--- suspect due to COPD exacerbation---pulmonology and infectious disease consult appreciated -Hypercapnia on admission noted, repeat ABG shows improvement in pCO2 -At baseline patient uses 6 L of nasal cannula  On admission was requiring high flow nasal cannula, -Continue to wean down oxygen to his baseline (currently fluctuating between 8 to 10 L of oxygen per minute to keep O2 sats in the low 90s) -Treated with bronchodilators (levoalbuterol preferred due to propensity towards tachycardia) and mucolytics -Treated with IV Solu-Medrol  - -We were  judicious with benzos and sedatives due to  worsening hypoxia and "new" hypercapnia -Repeat ABG on 60% FiO2 on 08/12/2019 with hypoxia but resolved hypercapnia -Okay to discharge on p.o. prednisone   2)HFpEF--last known EF 55 to 60%,  -Appears euvolemic and compensated at this time -Continue Lasix 40 mg twice daily -Pt Tells me  he is allergic to Ted stockings -After much persuasion is agreeable to try Ace wraps for lower extremities  3)H/o MAI with  abnormal CT chest finding---Thick-walled large cavitary lesion with internal debris in the posterior left hemithorax, markedly progressive from 09/02/2018. --- Differential diagnosis includes chronic infectious/inflammatory etiology, including mycetoma, can have this appearance. Malignancy cannot be excluded. -May be due to underlying MAI-- Cannot rule out ILD or malignancy or aspergilloma/ mycetoma -Sputum for AFB, Quant gold TB and sputum for fungus requested -Pulmonology consult  appreciated -Continue PTA azithromycin 500 mg daily, Clofazimine 100mg  daily  and rifampin 600 mg daily for MAI -On 08/12/2019 I had conference with pulmonologist Dr. [name] and infectious disease specialist Dr. Marcellina Millin pulmonologist and ID physician agreeable with current plan of care -Sputum 1/25 Mod wbc, rare GPCs in clusters>>>stenotrophomanas maltophilia sensitive to bactrim, resistant to levaquin--plan is to treat Stenotrophomonas maltophilia with Bactrim DS 1 twice daily for 20 days with repeat sputum sample as outpatient in 3 weeks Quant GOLD TB 1/25 Neg  MRSA pcr 1/25 Neg Covid PCR 1/25 neg  Sputum AFB 1/25 >>>neg smear >>> -Sputum AFB 1/27 ->>> Neg smear Sputum AFB 1/28>>>> negative smear -AFB cultures are pending Sputum fungus 1/25 >>>yeast only on smear >>>---  treated yeast with Diflucan -Overall respiratory status improved significantly patient's oxygen requirement is back to baseline of 5 to 6 L /min -No fevers, no increased work of breathing respiratory status appears baseline  4)PAFib--- PTA was not on anticoagulation, tends to have RVR with bronchodilators -Continue Cardizem CD 120 mg daily for rate control   5)DM2-A1c is 8.3 reflecting uncontrolled diabetes, anticipate worsening hyperglycemia with steroids, Lantus insulin 60  units daily along with sliding scale NovoLog  6)Mild Anemia---hemoglobin is 10.1, asymptomatic ,  -Continue Protonix -Monitor H&H if H&H continues to drop consider inpatient GI consult otherwise deferred to outpatient as patient current cardiopulmonary status precludesendoluminal evaluation at this time  7)BPH --- stable, continue Flomax  8)Anxiety/Depression-stable, continue Celexa and lorazepam -Patient and his wife were unhappy with my reluctance to escalate is benzos, opiates and sedatives, they both tell me that Dr. Luan Pulling used to be more "generous" with benzos and sedatives --I reminded them that we needed to be  judicious  with benzos, opiates and sedatives due to worsening hypoxia and "new" hypercapnia  9)GERD/Barrett's --- continue Protonix 40 mg twice daily  10)Social/ethics--palliative care consult requested currently full code- --Patient speaks of his nostalgia about Dr. Luan Pulling who was retired  -Patient continues to have a list of complaints and Grievances:- 1) he is unhappy about carb restricted diet--His  A1c is 8.3 and sugars are running over 300 due to steroids--patient states that Dr.  Luan Pulling used to give him what he ask for and cannot understand why I have him on a carb restricted diet -He took out his frustration on the kitchen staff who brought his lunch--- I again pleaded with patient and reminded him that kitchen staff cannot change his diet that physicians responsible for dietary orders  2) patient requesting that his narcotics and benzos be increased--- I explained to him that given his tenuous respiratory status with increased oxygen requirement--it is recommended that his opiates and benzos not be escalated at this  time especially given his hypercarbia on admission with risk for further CO2 retention--- patient tells me that Dr. Luan Pulling used to give him more pain medications -Patient's wife also requested that his pain medications and benzos be increased -I again explained the rationale for my reluctance to escalate his opiates and benzos at this time to both patient and his wife  3) patient states that in the past we have given him albuterol and almost killed him (apparently had tachyarrhythmia )--- he also states that he does not trust anybody but Dr. Luan Pulling -I again reminded patient that he is currently on Xopenex, also reminded him that Dr. Luan Pulling has retired  4) patient unhappy about being in airborne isolation room for TB rule out--- patient's wife requesting that Airbone isolation be discontinued while TB rule out was still in progress -I again explained to both patient and his wife  that the pulmonologist has recommended TB rule out pending AFB x3 - test we have to keep patient in airborne isolation -Patient remains unhappy about this  5)Patient is also unhappy about the timing and scheduling of his medications and meals/snacks --In summary patient tells me he has been coming here for 20 years and Dr. Luan Pulling typically do things differently -Patient tells me that the hospital doctors including pulmonology team do not know what we are talking about -Patient remains unhappy despite multiple attempts to explain the rationale for the care provided  --Patient continues to make "inappropriate", sexually suggestive comments towards male staff (told RN that if she is tired is because she has been running through his mind all day, says he wishes that they did not have to have a gown and PPE on so he can see their scrubs better --RN told patient that his blood sugars are high this a.m.--patient proceeded to tell RN that his girlfriend came into his room middle of the night to give him some "sugar"  -Patient continues to make sexually inappropriate suggestive comments to male members of staff  -Also continues to yell and take out his frustration  on  ancillary staff including the kitchen staff  --Patient gets very frustrated when he doesn't get what he wants when he wants it and how he wants it  Disposition -discharge home with his wife  Code Status : Full  Family Communication:  (patient is alert, awake and coherent) -Discussed with his wife who is at bedside (called pt's wife on the phone in pt's room)  Disposition Plan  : -Discharge home with his wife  Consults  : Pulmonology/ID (Dr Linus Salmons  Discharge Condition: -Overall respiratory status improved significantly patient's oxygen requirement is back to baseline of 5 to 6 L  -No fevers, no increased work of breathing respiratory status appears baseline  Follow UP--- outpatient follow-up with infectious disease doctor  Dr. Linus Salmons and pulmonologist Dr. Christinia Gully -as outlined in discharge instructions   Diet and Activity recommendation:  As advised  Discharge Instructions    Discharge Instructions    Call MD for:  difficulty breathing, headache or visual disturbances   Complete by: As directed    Call MD for:  extreme fatigue   Complete by: As directed    Call MD for:  persistant dizziness or light-headedness   Complete by: As directed    Call MD for:  persistant nausea and vomiting   Complete by: As directed    Call MD for:  severe uncontrolled pain   Complete by: As directed    Call MD for:  temperature >100.4   Complete by: As directed    Diet - low sodium heart healthy   Complete by: As directed    Diet Carb Modified   Complete by: As directed    Discharge instructions   Complete by: As directed    1) prednisone taper as prescribed ---take 40 mg with breakfast daily for 4 days then 30 mg daily for 4 days then 20 mg daily for 4 days, then 10 mg daily indefinitely-always take with food 2) please reduce potassium chloride supplements to 20 mEq once a day from twice a day while you are on Bactrim to avoid your potassium being too high 3) please check BMP blood test every Wednesday starting 08/17/2019 while you are on Bactrim as Bactrim can affect your kidney function and your potassium levels  4)Please Follow up with pulmonologist Dr. Christinia Gully as outpatient in about 2 to 3  weeks for recheck and reevaluation -Christinia Gully, MD at [facility] Address: [address], [place], [place] [address] Phone: [phone] 5) outpatient follow-up with your infectious disease specialist Dr,.Comer in about 3 weeks or so advised, you probably need a repeat sputum test 6)Avoid ibuprofen/Advil/Aleve/Motrin/Goody Powders/Naproxen/BC powders/Meloxicam/Diclofenac/Indomethacin and other Nonsteroidal anti-inflammatory medications as these will make you more likely to bleed and can cause stomach ulcers, can  also cause Kidney problems.   Increase activity slowly   Complete by: As directed         Discharge Medications     Allergies as of 08/14/2019      Reactions   Albuterol Palpitations   Causes pt to have a-fib    Ciprofloxacin Other (See Comments)   Patient experiences vision loss from long-term and short-term use   Influenza Vaccine Live Swelling      Medication List    STOP taking these medications   levofloxacin 500 MG tablet Commonly known as: LEVAQUIN     TAKE these medications   acetaminophen 325 MG tablet Commonly known as: TYLENOL Take 2 tablets (650 mg total) by mouth every 6 (six) hours as needed for mild pain, moderate pain, fever or headache (or Fever >/= 101).   alfuzosin 10 MG 24 hr tablet Commonly known as: UROXATRAL Take 10 mg by mouth daily with breakfast.   AMBULATORY NON FORMULARY MEDICATION Take 100 mg by mouth daily. Medication Name: Clofazimine   azithromycin 500 MG tablet Commonly known as: ZITHROMAX 1 Tab daily   BD Pen Needle Nano 2nd Gen 32G X 4 MM Misc Generic drug: Insulin Pen Needle INJECT UP TO 5 TIMES DAILY   benzonatate 200 MG capsule Commonly known as: TESSALON Take 200 mg by mouth 3 (three) times daily.   budesonide 0.25 MG/2ML nebulizer solution Commonly known as: PULMICORT Take 2 mLs (0.25 mg total) by nebulization 2 (two) times daily. What changed: when to take this   Cartia XT 120 MG 24 hr capsule Generic drug: diltiazem Take 1 capsule (120 mg total) by mouth daily.   citalopram 20 MG tablet Commonly known as: CELEXA Take 1 tablet (20 mg total) by mouth daily.   diphenhydrAMINE 25 mg capsule Commonly known as: BENADRYL Take 25 mg by mouth every 8 (eight) hours as needed. Felt mouth swelling at ED visit 08/16/18.   fexofenadine 180 MG tablet Commonly known as: ALLEGRA Take 180 mg by mouth daily.   fluticasone 50 MCG/ACT nasal spray Commonly known as: FLONASE Place 2 sprays into both nostrils daily as needed.    furosemide 40 MG tablet Commonly  known as: Lasix Take 1 tablet (40 mg total) by mouth 2 (two) times daily. As needed for swelling   guaiFENesin 600 MG 12 hr tablet Commonly known as: MUCINEX Take 600 mg by mouth 2 (two) times daily.   HYDROcodone-homatropine 5-1.5 MG/5ML syrup Commonly known as: HYCODAN Take 5 mLs by mouth every 6 (six) hours as needed for cough.   insulin glargine 100 UNIT/ML injection Commonly known as: LANTUS Inject 0.6 mLs (60 Units total) into the skin daily. 24 units daily What changed: how much to take   insulin lispro 100 UNIT/ML injection Commonly known as: HumaLOG Take by sliding scale provided by hospital maximum amount daily is 60 units.  Check blood sugar 4 times a day What changed:   how much to take  how to take this  when to take this   ipratropium 0.02 % nebulizer solution Commonly known as: ATROVENT Take 0.5 mg by nebulization every 6 (six) hours as needed.   levalbuterol 0.63 MG/3ML nebulizer solution Commonly known as: XOPENEX Take 0.63 mg by nebulization every 6 (six) hours as needed.   LORazepam 0.5 MG tablet Commonly known as: ATIVAN Take 1 tablet (0.5 mg total) by mouth 2 (two) times daily as needed for anxiety or sleep. What changed: when to take this   metFORMIN 500 MG tablet Commonly known as: GLUCOPHAGE Take 1 tablet (500 mg total) by mouth daily with breakfast.   methocarbamol 500 MG tablet Commonly known as: ROBAXIN Take 500 mg by mouth 2 (two) times daily.   montelukast 10 MG tablet Commonly known as: SINGULAIR Take 1 tablet by mouth daily.   nitroGLYCERIN 0.4 MG SL tablet Commonly known as: Nitrostat Place 1 tablet (0.4 mg total) under the tongue every 5 (five) minutes as needed for chest pain.   omeprazole 20 MG capsule Commonly known as: PRILOSEC Take 1 capsule (20 mg total) by mouth 2 (two) times daily before a meal.   oxyCODONE-acetaminophen 10-325 MG tablet Commonly known as: PERCOCET Take 1 tablet  by mouth every 4 (four) hours as needed for pain.   oxymetazoline 0.05 % nasal spray Commonly known as: AFRIN Place 1 spray into both nostrils 2 (two) times daily.   potassium chloride SA 20 MEQ tablet Commonly known as: KLOR-CON Take 1 tablet once daily while on Lasix What changed: additional instructions   predniSONE 10 MG tablet Commonly known as: DELTASONE Take 1 tablet (10 mg total) by mouth See admin instructions. Take 40 mg with breakfast daily for 4 days then 30 mg daily for 4 days then 20 mg daily for 4 days 10 mg daily indefinitely-always take with food What changed:   how much to take  how to take this  when to take this  additional instructions   rifampin 300 MG capsule Commonly known as: RIFADIN Take 2 capsules (600 mg total) by mouth daily. Long-term medication   rosuvastatin 10 MG tablet Commonly known as: CRESTOR Take 10 mg by mouth every morning.   sulfamethoxazole-trimethoprim 800-160 MG tablet Commonly known as: BACTRIM DS Take 1 tablet by mouth 2 (two) times daily for 19 days.   Symbicort 160-4.5 MCG/ACT inhaler Generic drug: budesonide-formoterol INL 2 PFS PO BID   tamsulosin 0.4 MG Caps capsule Commonly known as: FLOMAX Take 1 capsule (0.4 mg total) by mouth 2 (two) times daily.   Trelegy Ellipta 100-62.5-25 MCG/INH Aepb Generic drug: Fluticasone-Umeclidin-Vilant Inhale 1 puff into the lungs daily.      Major procedures and Radiology Reports - PLEASE review  detailed and final reports for all details, in brief -   CT Chest High Resolution  Result Date: 08/08/2019 CLINICAL DATA:  Shortness of breath, productive cough. EXAM: CT CHEST WITHOUT CONTRAST TECHNIQUE: Multidetector CT imaging of the chest was performed following the standard protocol without intravenous contrast. High resolution imaging of the lungs, as well as inspiratory and expiratory imaging, was performed. COMPARISON:  09/02/2018, 09/01/2017 and 11/28/2015. FINDINGS:  Cardiovascular: Atherosclerotic calcification of the aorta and coronary arteries. Pulmonic trunk is enlarged. Heart size normal. No pericardial effusion. Mediastinum/Nodes: No pathologically enlarged mediastinal or axillary lymph nodes. Hilar regions are difficult to evaluate without IV contrast. Esophagus is unremarkable. Lungs/Pleura: Image quality is degraded by respiratory motion. Severe bullous emphysema. Thick-walled cavity with internal debris in the posterior left hemithorax measures approximately 5.3 x 8.5 cm (5/43) and has enlarged significantly from prior exams. Dependent atelectasis in the left lower lobe. Lingular nodule measures 7 mm (5/88), unchanged from 09/01/2017 and considered benign. Trace left pleural fluid. Airway is unremarkable. Upper Abdomen: Visualized portions of the liver, adrenal glands, kidneys, spleen, pancreas, stomach and bowel are grossly unremarkable with the exception of a small hiatal hernia. No upper abdominal adenopathy. Musculoskeletal: No worrisome lytic or sclerotic lesions. IMPRESSION: 1. Thick-walled large cavitary lesion with internal debris in the posterior left hemithorax, markedly progressive from 09/02/2018. A chronic infectious/inflammatory etiology, including mycetoma, can have this appearance. Malignancy cannot be excluded. 2. Trace left pleural effusion. 3. Significant respiratory motion degrades image quality. No findings worrisome for interstitial lung disease. 4. Aortic atherosclerosis (ICD10-I70.0). Coronary artery calcification. 5. Enlarged pulmonic trunk, indicative of pulmonary arterial hypertension. 6. Severe bullous Emphysema (ICD10-J43.9). Electronically Signed   By: Lorin Picket M.D.   On: 08/08/2019 10:04   US Venous Img Lower Bilateral (DVT)  Result Date: 08/08/2019 CLINICAL DATA:  Leg swelling x1 month. EXAM: BILATERAL LOWER EXTREMITY VENOUS DOPPLER ULTRASOUND TECHNIQUE: Gray-scale sonography with compression, as well as color and duplex  ultrasound, were performed to evaluate the deep venous system(s) from the level of the common femoral vein through the popliteal and proximal calf veins. COMPARISON:  None. FINDINGS: VENOUS Normal compressibility of the common femoral, superficial femoral, and popliteal veins, as well as the visualized calf veins. Visualized portions of profunda femoral vein and great saphenous vein unremarkable. No filling defects to suggest DVT on grayscale or color Doppler imaging. Doppler waveforms show normal direction of venous flow, normal respiratory phasicity and response to augmentation. Limited views of the contralateral common femoral vein are unremarkable. OTHER None. Limitations: none IMPRESSION: No femoropopliteal DVT nor evidence of DVT within the visualized calf veins. If clinical symptoms are inconsistent or if there are persistent or worsening symptoms, further imaging (possibly involving the iliac veins) may be warranted. Electronically Signed   By: Virgina Norfolk M.D.   On: 08/08/2019 17:15   DG Chest Port 1 View  Result Date: 08/08/2019 CLINICAL DATA:  Increasing shortness of breath with productive cough EXAM: PORTABLE CHEST 1 VIEW COMPARISON:  Radiograph 05/04/2019, CT 09/02/2018 FINDINGS: Patchy opacities present in the right lung base as well as in the left infrahilar lung and periphery of the left upper lobe. Findings are on a background of diffuse biapical scarring and severe emphysematous changes with architectural distortion most pronounced in the left upper lung, better visualized on comparison cross-sectional imaging. Cardiomediastinal contours are similar to prior counting for differences in technique. Question some left pleural fluid tracking within the fissures. No clear pneumothorax is seen. Portion of the right costophrenic sulcus is  collimated from view. No acute osseous or soft tissue abnormality. IMPRESSION: Patchy opacities in the right lung base as well as in the left infrahilar lung  and periphery of the left upper lobe could reflect acute infectious consolidation. Background of severe emphysematous changes with widespread scarring and architectural distortion most pronounced in the left upper lung. Electronically Signed   By: Lovena Le M.D.   On: 08/08/2019 06:40    Micro Results   Recent Results (from the past 240 hour(s))  Respiratory Panel by RT PCR (Flu A&B, Covid) - Nasopharyngeal Swab     Status: None   Collection Time: 08/08/19  6:16 AM   Specimen: Nasopharyngeal Swab  Result Value Ref Range Status   SARS Coronavirus 2 by RT PCR NEGATIVE NEGATIVE Final    Comment: (NOTE) SARS-CoV-2 target nucleic acids are NOT DETECTED. The SARS-CoV-2 RNA is generally detectable in upper respiratoy specimens during the acute phase of infection. The lowest concentration of SARS-CoV-2 viral copies this assay can detect is 131 copies/mL. A negative result does not preclude SARS-Cov-2 infection and should not be used as the sole basis for treatment or other patient management decisions. A negative result may occur with  improper specimen collection/handling, submission of specimen other than nasopharyngeal swab, presence of viral mutation(s) within the areas targeted by this assay, and inadequate number of viral copies (<131 copies/mL). A negative result must be combined with clinical observations, patient history, and epidemiological information. The expected result is Negative. Fact Sheet for Patients:  PinkCheek.be Fact Sheet for Healthcare Providers:  GravelBags.it This test is not yet ap proved or cleared by the [place] FDA and  has been authorized for detection and/or diagnosis of SARS-CoV-2 by FDA under an Emergency Use Authorization (EUA). This EUA will remain  in effect (meaning this test can be used) for the duration of the COVID-19 declaration under Section 564(b)(1) of the Act, 21 U.S.C. section  360bbb-3(b)(1), unless the authorization is terminated or revoked sooner.    Influenza A by PCR NEGATIVE NEGATIVE Final   Influenza B by PCR NEGATIVE NEGATIVE Final    Comment: (NOTE) The Xpert Xpress SARS-CoV-2/FLU/RSV assay is intended as an aid in  the diagnosis of influenza from Nasopharyngeal swab specimens and  should not be used as a sole basis for treatment. Nasal washings and  aspirates are unacceptable for Xpert Xpress SARS-CoV-2/FLU/RSV  testing. Fact Sheet for Patients: PinkCheek.be Fact Sheet for Healthcare Providers: GravelBags.it This test is not yet approved or cleared by the [place] FDA and  has been authorized for detection and/or diagnosis of SARS-CoV-2 by  FDA under an Emergency Use Authorization (EUA). This EUA will remain  in effect (meaning this test can be used) for the duration of the  Covid-19 declaration under Section 564(b)(1) of the Act, 21  U.S.C. section 360bbb-3(b)(1), unless the authorization is  terminated or revoked. Performed at [facility], [address] [place], [place] [address]   Culture, sputum-assessment     Status: None   Collection Time: 08/08/19  8:17 AM   Specimen: Expectorated Sputum  Result Value Ref Range Status   Specimen Description EXPECTORATED SPUTUM  Final   Special Requests NONE  Final   Sputum evaluation   Final    THIS SPECIMEN IS ACCEPTABLE FOR SPUTUM CULTURE Performed at [facility], [address] [place],  [address]    Report Status 08/08/2019 FINAL  Final  Culture, respiratory     Status: None   Collection Time: 08/08/19  8:17  AM  Result Value Ref Range Status   Specimen Description   Final    EXPECTORATED SPUTUM Performed at [facility], [address], [place], [place] [address]    Special Requests   Final    NONE Reflexed from [id number] Performed at [facility], [address], [place], [place] [address]    Gram Stain   Final    MODERATE  WBC PRESENT,BOTH PMN AND MONONUCLEAR RARE GRAM POSITIVE COCCI IN CLUSTERS Performed at [facility], [place] [address], [place], [place] [address]    Culture FEW STENOTROPHOMONAS MALTOPHILIA  Final   Report Status 08/11/2019 FINAL  Final   Organism ID, Bacteria STENOTROPHOMONAS MALTOPHILIA  Final      Susceptibility   Stenotrophomonas maltophilia - MIC*    LEVOFLOXACIN >=8 RESISTANT Resistant     TRIMETH/SULFA <=20 SENSITIVE Sensitive     * FEW STENOTROPHOMONAS MALTOPHILIA  Acid Fast Smear (AFB)     Status: None   Collection Time: 08/08/19  4:06 PM   Specimen: Sputum  Result Value Ref Range Status   AFB Specimen Processing Concentration  Final   Acid Fast Smear Negative  Final    Comment: (NOTE) Performed At: [facility] [address] [place], [place] [id number] Rush Farmer MD [id number]    Source (AFB) SPUTUM  Final    Comment: Performed at [facility], [address], [place], [place] [address]  Fungus Culture With Stain     Status: Abnormal (Preliminary result)   Collection Time: 08/08/19  4:06 PM   Specimen: Sputum  Result Value Ref Range Status   Fungus Stain Final report (A)  Final    Comment: (NOTE) Performed At: [facility] [place], [place] [id number] Rush Farmer MD [id number]    Fungus (Mycology) Culture PENDING  Incomplete   Fungal Source SPUTUM  Final    Comment: Performed at [facility], [address], [place], [place] [address]  Fungus Culture Result     Status: Abnormal   Collection Time: 08/08/19  4:06 PM  Result Value Ref Range Status   Result 1 Comment (A)  Final    Comment: (NOTE) Pseudohyphae and yeasts observed Performed At: [facility] [place], [place] [id number] Rush Farmer MD [id number]   MRSA PCR Screening     Status: None   Collection Time: 08/08/19  4:48 PM   Specimen: Nasopharyngeal  Result Value Ref Range Status   MRSA by PCR NEGATIVE NEGATIVE Final     Comment:        The GeneXpert MRSA Assay (FDA approved for NASAL specimens only), is one component of a comprehensive MRSA colonization surveillance program. It is not intended to diagnose MRSA infection nor to guide or monitor treatment for MRSA infections. Performed at [facility], [address], [place], [place] [address]   Acid Fast Smear (AFB)     Status: None   Collection Time: 08/10/19  9:11 AM   Specimen: Sputum  Result Value Ref Range Status   AFB Specimen Processing Concentration  Final   Acid Fast Smear Negative  Final    Comment: (NOTE) Performed At: [facility] [address] [place], [place] [id number] Rush Farmer MD [id number]    Source (AFB) SPUTUM  Final    Comment: Performed at [facility], [address] [place], [place] [address]  Acid Fast Smear (AFB)     Status: None   Collection Time: 08/11/19 12:37 PM   Specimen: Sputum  Result Value  Ref Range Status   AFB Specimen Processing Concentration  Final   Acid Fast Smear Negative  Final    Comment: (NOTE) Performed At: [facility] [place], [place] [id number] Rush Farmer MD [id number]    Source (AFB) SPUTUM  Final    Comment: Performed at [facility], [address] [place], [place] [address]       Today   [place] today has no new medical complaints  --Overall respiratory status improved significantly patient's oxygen requirement is back to baseline of 5 to 6 L  -No fevers, no increased work of breathing respiratory status appears baseline          Patient has been seen and examined prior to discharge   Objective   Blood pressure 120/75, pulse 93, temperature 98.1 F (36.7 C), temperature source Oral, resp. rate 20, height 5\' 8"  (1.727 m), weight 78.3 kg, SpO2 94 %.   Intake/Output Summary (Last 24 hours) at 08/14/2019 1436 Last data filed at 08/14/2019 1341 Gross per 24 hour  Intake 480 ml  Output 2540 ml  Net -2060 ml      Exam Gen:- Awake Alert, no acute distress, speaking in sentences HEENT:- [place].AT, No sclera icterus Nose- [place] 5L/min Neck-Supple Neck,No JVD,.  Lungs-improved air movement, no wheezing CV- S1, S2 normal, regular Abd-  +ve B.Sounds, Abd Soft, No tenderness,    Extremity/Skin:-Improved lower extremity edema after  Ace wrap , good pulses Psych-affect is appropriate, oriented x3 Neuro-no new focal deficits, no tremors    Data Review   CBC w Diff:  Lab Results  Component Value Date   WBC 6.0 08/12/2019   HGB 9.9 (L) 08/12/2019   HCT 32.7 (L) 08/12/2019   PLT 284 08/12/2019   LYMPHOPCT 22 05/22/2019   BANDSPCT 2 07/25/2015   MONOPCT 15 05/22/2019   EOSPCT 9 05/22/2019   BASOPCT 0 05/22/2019    CMP:  Lab Results  Component Value Date   NA 135 08/12/2019   K 4.2 08/12/2019   CL 92 (L) 08/12/2019   CO2 33 (H) 08/12/2019   BUN 17 08/12/2019   CREATININE 1.11 08/12/2019   CREATININE 1.20 01/10/2019   PROT 7.4 08/08/2019   ALBUMIN 2.7 (L) 08/08/2019   BILITOT 0.5 08/08/2019   ALKPHOS 60 08/08/2019   AST 24 08/08/2019   ALT 24 08/08/2019  .   Total Discharge time is about 33 minutes  Roxan Hockey M.D on 08/14/2019 at 2:36 PM  Go to [email or web address] -  for contact info  [facility] - Office  [phone]

## 2019-08-14 NOTE — Plan of Care (Signed)

## 2019-08-14 NOTE — Plan of Care (Signed)
  Problem: Education: Goal: Knowledge of General Education information will improve Description: Including pain rating scale, medication(s)/side effects and non-pharmacologic comfort measures 08/14/2019 1459 by Santa Lighter, RN Outcome: Adequate for Discharge 08/14/2019 1458 by Santa Lighter, RN Outcome: Progressing   Problem: Health Behavior/Discharge Planning: Goal: Ability to manage health-related needs will improve 08/14/2019 1459 by Santa Lighter, RN Outcome: Adequate for Discharge 08/14/2019 1458 by Santa Lighter, RN Outcome: Progressing   Problem: Clinical Measurements: Goal: Ability to maintain clinical measurements within normal limits will improve 08/14/2019 1459 by Santa Lighter, RN Outcome: Adequate for Discharge 08/14/2019 1458 by Santa Lighter, RN Outcome: Progressing Goal: Will remain free from infection Outcome: Adequate for Discharge Goal: Diagnostic test results will improve Outcome: Adequate for Discharge Goal: Respiratory complications will improve 08/14/2019 1459 by Santa Lighter, RN Outcome: Adequate for Discharge 08/14/2019 1458 by Santa Lighter, RN Outcome: Progressing Goal: Cardiovascular complication will be avoided Outcome: Adequate for Discharge   Problem: Activity: Goal: Risk for activity intolerance will decrease Outcome: Adequate for Discharge   Problem: Nutrition: Goal: Adequate nutrition will be maintained Outcome: Adequate for Discharge   Problem: Coping: Goal: Level of anxiety will decrease Outcome: Adequate for Discharge   Problem: Elimination: Goal: Will not experience complications related to bowel motility Outcome: Adequate for Discharge Goal: Will not experience complications related to urinary retention Outcome: Adequate for Discharge   Problem: Pain Managment: Goal: General experience of comfort will improve Outcome: Adequate for Discharge   Problem: Safety: Goal: Ability to remain free from injury will  improve Outcome: Adequate for Discharge   Problem: Skin Integrity: Goal: Risk for impaired skin integrity will decrease Outcome: Adequate for Discharge

## 2019-08-14 NOTE — Discharge Instructions (Signed)
1) prednisone taper as prescribed ---take 40 mg with breakfast daily for 4 days then 30 mg daily for 4 days then 20 mg daily for 4 days, then 10 mg daily indefinitely-always take with food 2) please reduce potassium chloride supplements to 20 mEq once a day from twice a day while you are on Bactrim to avoid your potassium being too high 3) please check BMP blood test every Wednesday starting 08/17/2019 while you are on Bactrim as Bactrim can affect your kidney function and your potassium levels  4)Please Follow up with pulmonologist Dr. Christinia Gully as outpatient in about 2 to 3  weeks for recheck and reevaluation -Christinia Gully, MD at [facility] Address: [address], [place], [place] [address] Phone: [phone] 5) outpatient follow-up with your infectious disease specialist Dr,.Comer in about 3 weeks or so advised, you probably need a repeat sputum test 6)Avoid ibuprofen/Advil/Aleve/Motrin/Goody Powders/Naproxen/BC powders/Meloxicam/Diclofenac/Indomethacin and other Nonsteroidal anti-inflammatory medications as these will make you more likely to bleed and can cause stomach ulcers, can also cause Kidney problems.

## 2019-08-14 NOTE — Progress Notes (Signed)
Nsg Discharge Note  Admit Date:  08/08/2019 Discharge date: 08/14/2019   Ryan [name] to be D/C'd Home per MD order.  AVS completed.  Copy for chart, and copy for patient signed, and dated. Removed IV-clean, dry, intact. Tiffany Osbourne reviewed d/c paperwork with patient and wife. Stable patient and belongings were wheeled to short stay entrance where he was picked up by his wife. Patient/caregiver able to verbalize understanding.  Discharge Medication: Allergies as of 08/14/2019      Reactions   Albuterol Palpitations   Causes pt to have a-fib    Ciprofloxacin Other (See Comments)   Patient experiences vision loss from long-term and short-term use   Influenza Vaccine Live Swelling      Medication List    STOP taking these medications   levofloxacin 500 MG tablet Commonly known as: LEVAQUIN     TAKE these medications   acetaminophen 325 MG tablet Commonly known as: TYLENOL Take 2 tablets (650 mg total) by mouth every 6 (six) hours as needed for mild pain, moderate pain, fever or headache (or Fever >/= 101).   alfuzosin 10 MG 24 hr tablet Commonly known as: UROXATRAL Take 10 mg by mouth daily with breakfast.   AMBULATORY NON FORMULARY MEDICATION Take 100 mg by mouth daily. Medication Name: Clofazimine   azithromycin 500 MG tablet Commonly known as: ZITHROMAX 1 Tab daily   BD Pen Needle Nano 2nd Gen 32G X 4 MM Misc Generic drug: Insulin Pen Needle INJECT UP TO 5 TIMES DAILY   benzonatate 200 MG capsule Commonly known as: TESSALON Take 200 mg by mouth 3 (three) times daily.   budesonide 0.25 MG/2ML nebulizer solution Commonly known as: PULMICORT Take 2 mLs (0.25 mg total) by nebulization 2 (two) times daily. What changed: when to take this   Cartia XT 120 MG 24 hr capsule Generic drug: diltiazem Take 1 capsule (120 mg total) by mouth daily.   citalopram 20 MG tablet Commonly known as: CELEXA Take 1 tablet (20 mg total) by mouth daily.   diphenhydrAMINE 25 mg  capsule Commonly known as: BENADRYL Take 25 mg by mouth every 8 (eight) hours as needed. Felt mouth swelling at ED visit 08/16/18.   fexofenadine 180 MG tablet Commonly known as: ALLEGRA Take 180 mg by mouth daily.   fluticasone 50 MCG/ACT nasal spray Commonly known as: FLONASE Place 2 sprays into both nostrils daily as needed.   furosemide 40 MG tablet Commonly known as: Lasix Take 1 tablet (40 mg total) by mouth 2 (two) times daily. As needed for swelling   guaiFENesin 600 MG 12 hr tablet Commonly known as: MUCINEX Take 600 mg by mouth 2 (two) times daily.   HYDROcodone-homatropine 5-1.5 MG/5ML syrup Commonly known as: HYCODAN Take 5 mLs by mouth every 6 (six) hours as needed for cough.   insulin glargine 100 UNIT/ML injection Commonly known as: LANTUS Inject 0.6 mLs (60 Units total) into the skin daily. 24 units daily What changed: how much to take   insulin lispro 100 UNIT/ML injection Commonly known as: HumaLOG Take by sliding scale provided by hospital maximum amount daily is 60 units.  Check blood sugar 4 times a day What changed:   how much to take  how to take this  when to take this   ipratropium 0.02 % nebulizer solution Commonly known as: ATROVENT Take 0.5 mg by nebulization every 6 (six) hours as needed.   levalbuterol 0.63 MG/3ML nebulizer solution Commonly known as: XOPENEX Take 0.63 mg by  nebulization every 6 (six) hours as needed.   LORazepam 0.5 MG tablet Commonly known as: ATIVAN Take 1 tablet (0.5 mg total) by mouth 2 (two) times daily as needed for anxiety or sleep. What changed: when to take this   metFORMIN 500 MG tablet Commonly known as: GLUCOPHAGE Take 1 tablet (500 mg total) by mouth daily with breakfast.   methocarbamol 500 MG tablet Commonly known as: ROBAXIN Take 500 mg by mouth 2 (two) times daily.   montelukast 10 MG tablet Commonly known as: SINGULAIR Take 1 tablet by mouth daily.   nitroGLYCERIN 0.4 MG SL  tablet Commonly known as: Nitrostat Place 1 tablet (0.4 mg total) under the tongue every 5 (five) minutes as needed for chest pain.   omeprazole 20 MG capsule Commonly known as: PRILOSEC Take 1 capsule (20 mg total) by mouth 2 (two) times daily before a meal.   oxyCODONE-acetaminophen 10-325 MG tablet Commonly known as: PERCOCET Take 1 tablet by mouth every 4 (four) hours as needed for pain.   oxymetazoline 0.05 % nasal spray Commonly known as: AFRIN Place 1 spray into both nostrils 2 (two) times daily.   potassium chloride SA 20 MEQ tablet Commonly known as: KLOR-CON Take 1 tablet once daily while on Lasix What changed: additional instructions   predniSONE 10 MG tablet Commonly known as: DELTASONE Take 1 tablet (10 mg total) by mouth See admin instructions. Take 40 mg with breakfast daily for 4 days then 30 mg daily for 4 days then 20 mg daily for 4 days 10 mg daily indefinitely-always take with food What changed:   how much to take  how to take this  when to take this  additional instructions   rifampin 300 MG capsule Commonly known as: RIFADIN Take 2 capsules (600 mg total) by mouth daily. Long-term medication   rosuvastatin 10 MG tablet Commonly known as: CRESTOR Take 10 mg by mouth every morning.   sulfamethoxazole-trimethoprim 800-160 MG tablet Commonly known as: BACTRIM DS Take 1 tablet by mouth 2 (two) times daily for 19 days.   Symbicort 160-4.5 MCG/ACT inhaler Generic drug: budesonide-formoterol INL 2 PFS PO BID   tamsulosin 0.4 MG Caps capsule Commonly known as: FLOMAX Take 1 capsule (0.4 mg total) by mouth 2 (two) times daily.   Trelegy Ellipta 100-62.5-25 MCG/INH Aepb Generic drug: Fluticasone-Umeclidin-Vilant Inhale 1 puff into the lungs daily.       Discharge Assessment: Vitals:   08/14/19 1340 08/14/19 1424  BP: 120/75   Pulse: 93   Resp: 20   Temp:    SpO2: 93% 94%   Skin clean, dry and intact without evidence of skin break down,  no evidence of skin tears noted. IV catheter discontinued intact. Site without signs and symptoms of complications - no redness or edema noted at insertion site, patient denies c/o pain - only slight tenderness at site.  Dressing with slight pressure applied.  D/c Instructions-Education: Discharge instructions given to patient/family with verbalized understanding. D/c education completed with patient/family including follow up instructions, medication list, d/c activities limitations if indicated, with other d/c instructions as indicated by MD - patient able to verbalize understanding, all questions fully answered. Patient instructed to return to ED, call 911, or call MD for any changes in condition.  Patient escorted via [place], and D/C home via private auto.  Santa Lighter, RN 08/14/2019 8:03 PM

## 2019-08-17 ENCOUNTER — Telehealth: Payer: Self-pay

## 2019-08-17 NOTE — Telephone Encounter (Signed)
Patient's wife called office today stating patient was prescribed Bactrim DS 160 mg during last visit at [facility]. States that patient was able to tolerate medication until Monday around 8 pm. States patient was been hallucinating, talking in his sleep, jerking, and looks "drugged up".  Denies any medication changes besides bactrim. Is not sure if the medicine is causing altered mental state or if COPD is.  [place]

## 2019-08-17 NOTE — Telephone Encounter (Signed)
I don't think that is the Bactrim and I would like him to try to continue to take it based on the bacteria that was found at [facility].  His symptoms sound mostly like his oxygen levels getting low.

## 2019-08-18 NOTE — Telephone Encounter (Signed)
Spoke with patient's wife in regards to message from yesterday. Relayed information from MD and instructed patient to continue medication as directed.  Requested they keep an eye on patient's O2 levels. Sherri reports o2 has been running between 93-94. Ryan [name]

## 2019-08-24 DIAGNOSIS — Z79891 Long term (current) use of opiate analgesic: Secondary | ICD-10-CM | POA: Diagnosis not present

## 2019-08-24 DIAGNOSIS — Z79899 Other long term (current) drug therapy: Secondary | ICD-10-CM | POA: Diagnosis not present

## 2019-08-24 DIAGNOSIS — R5383 Other fatigue: Secondary | ICD-10-CM | POA: Diagnosis not present

## 2019-08-24 DIAGNOSIS — Z1159 Encounter for screening for other viral diseases: Secondary | ICD-10-CM | POA: Diagnosis not present

## 2019-08-25 DIAGNOSIS — J449 Chronic obstructive pulmonary disease, unspecified: Secondary | ICD-10-CM | POA: Diagnosis not present

## 2019-09-06 IMAGING — DX DG CHEST 2V
2 series · 2 of 2 positions shown · non-contrast
Comparison: 04/19/2018

CLINICAL DATA: Chest pain today.

EXAM:
CHEST - 2 VIEW

[chest pa]
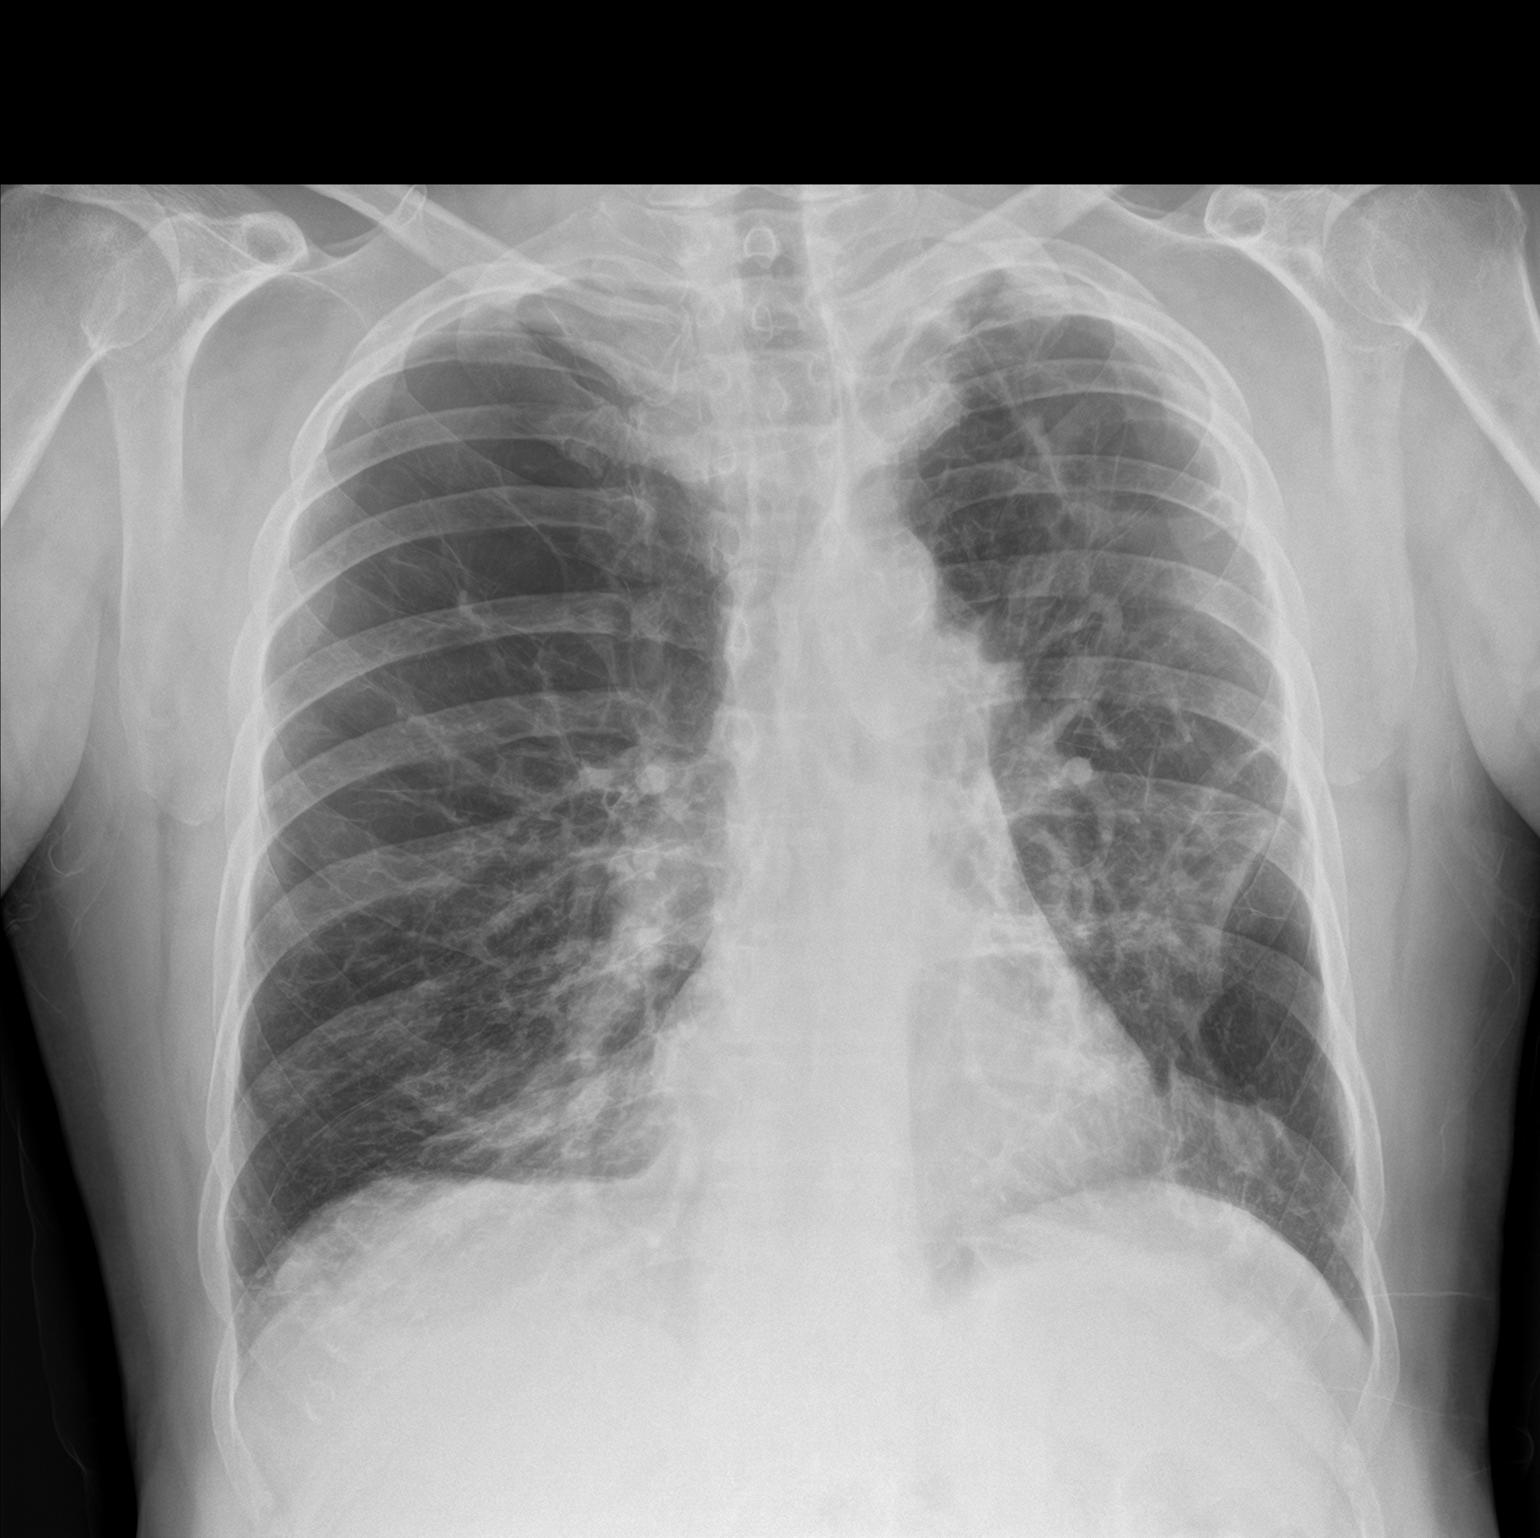

[chest lat]
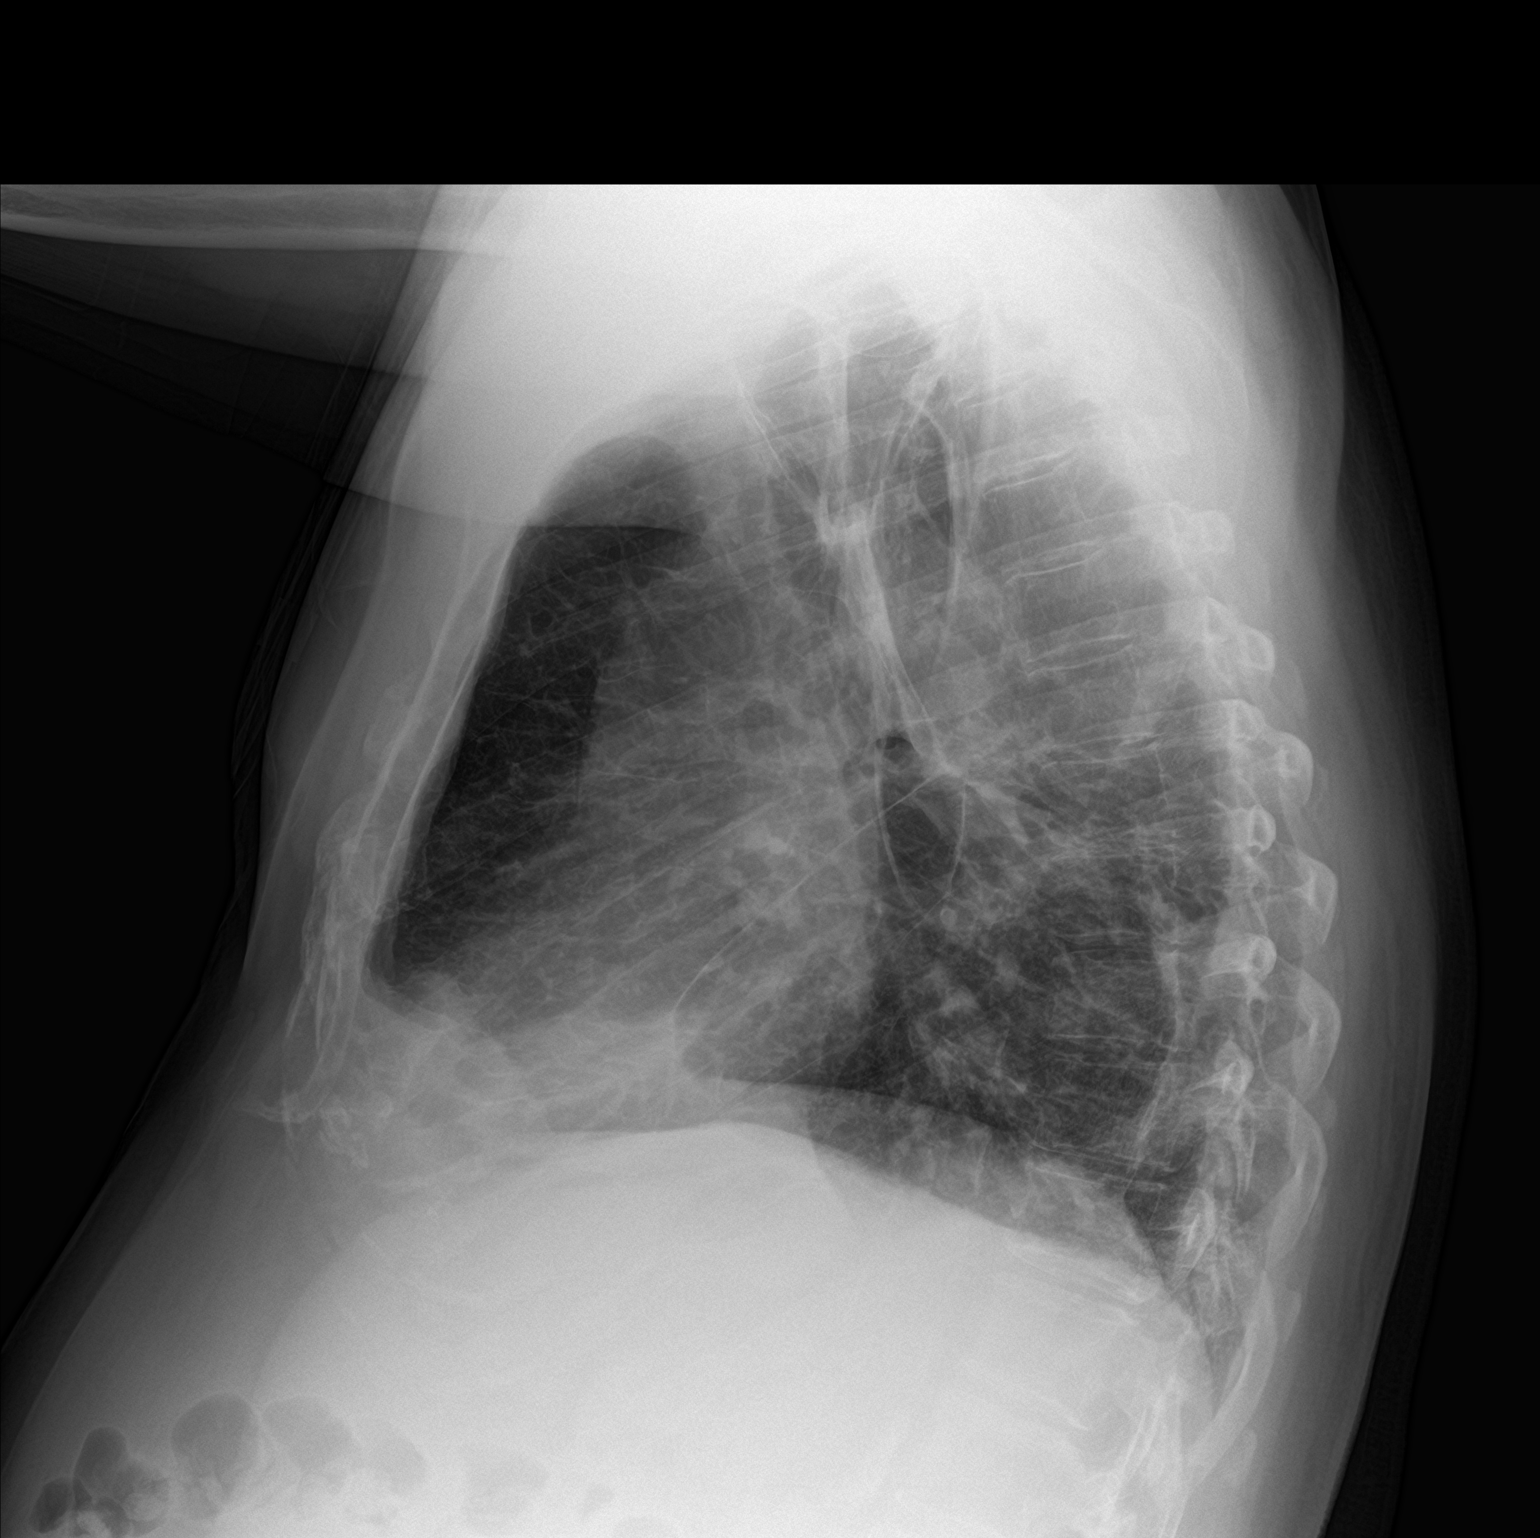

[2 of 2 positions shown; findings below may reference images not displayed]

FINDINGS: Heart size is normal.  Aortic atherosclerosis.

Moderate to severe emphysema again demonstrated. Scarring is again
seen predominately in the left lung, which is unchanged. No evidence
of pulmonary infiltrate or edema. No evidence of pneumothorax or
pleural effusion.
IMPRESSION: Stable emphysema and left lung scarring.  No active disease.

## 2019-09-06 IMAGING — CT CT ANGIO CHEST
2 of 6 series · 17 of 46 positions shown · IV contrast (Isovue)
Comparison: Chest radiographs dated 06/19/2018. CT chest dated
09/01/2017.

CLINICAL DATA: Chest pain

EXAM:
CT ANGIOGRAPHY CHEST WITH CONTRAST
TECHNIQUE: Multidetector CT imaging of the chest was performed using the
standard protocol during bolus administration of intravenous
contrast. Multiplanar CT image reconstructions and MIPs were
obtained to evaluate the vascular anatomy.
CONTRAST:  75mL 7NCJTC-0HP IOPAMIDOL (7NCJTC-0HP) INJECTION 76%

[Series 6: thins · axial · 0.69mm/px · z∈[+1138,+1411]mm · 14 of 299 slices shown]
[im 13/299  lung]
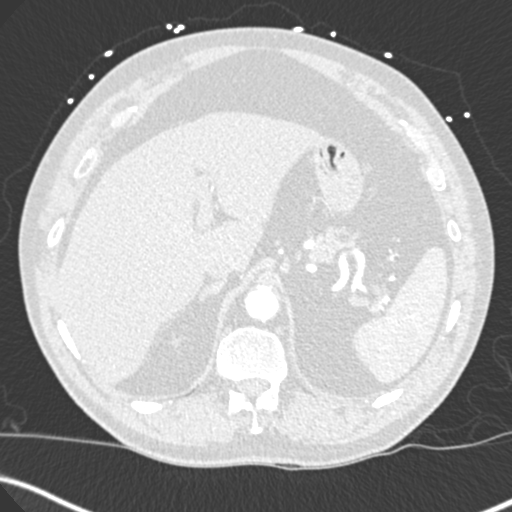
[im 39/299  soft-tissue]
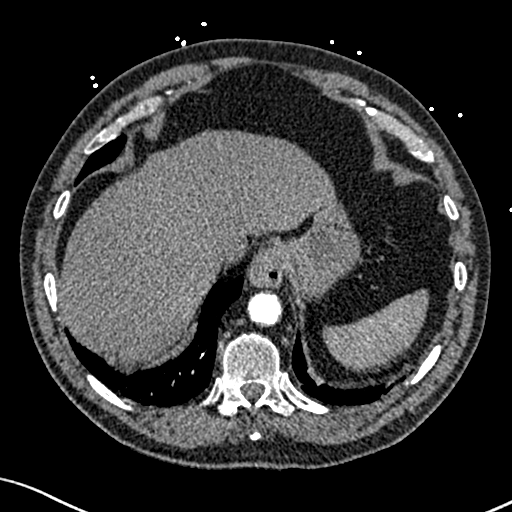
[im 52/299  lung]
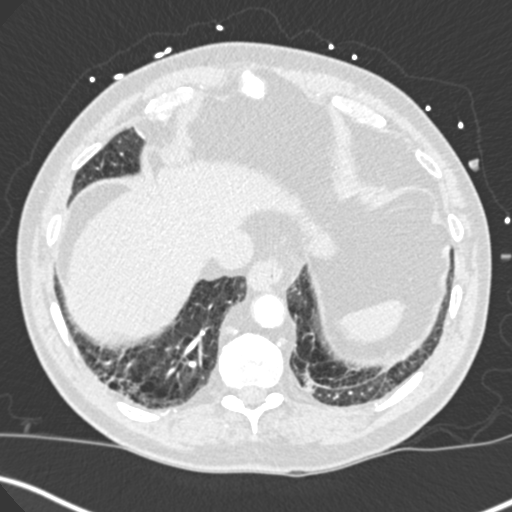
[im 78/299  soft-tissue]
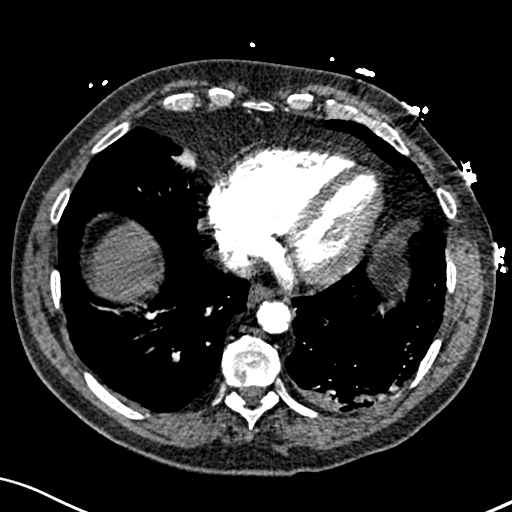
[im 104/299  lung]
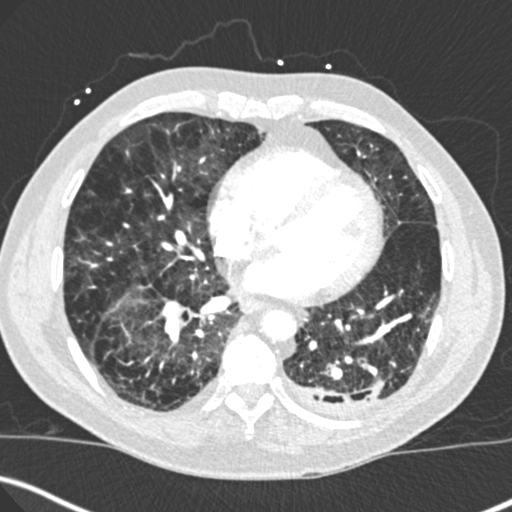
[im 117/299  soft-tissue]
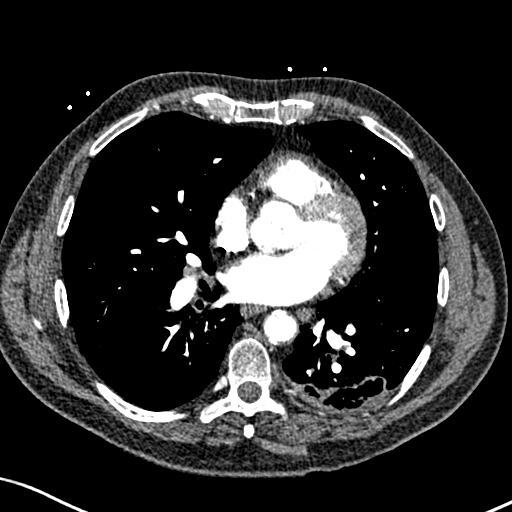
[im 143/299  lung]
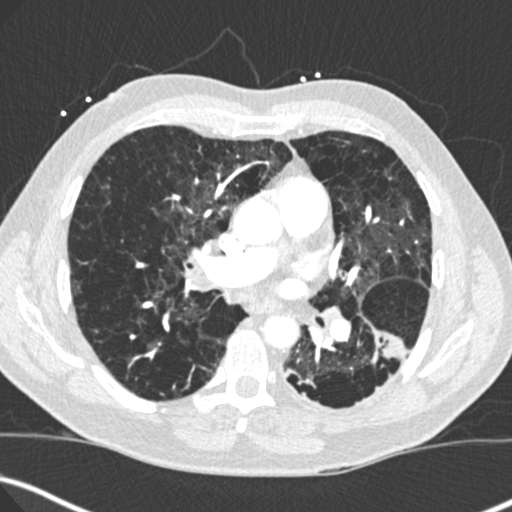
[im 156/299  soft-tissue]
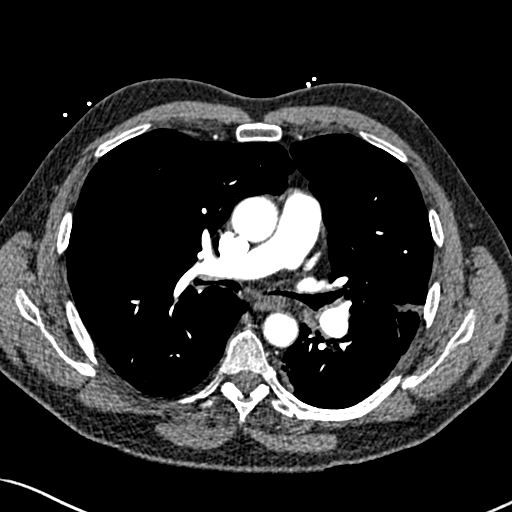
[im 182/299  lung]
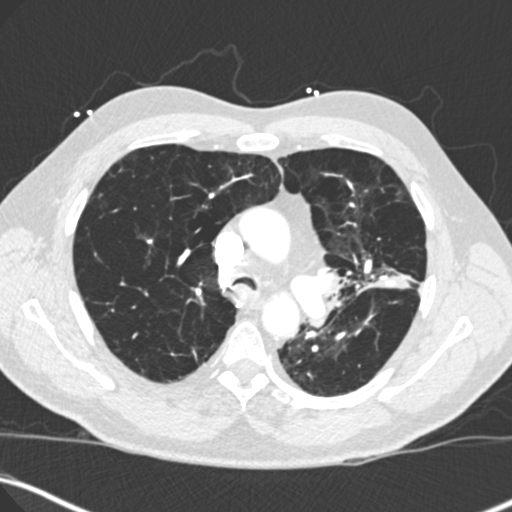
[im 195/299  soft-tissue]
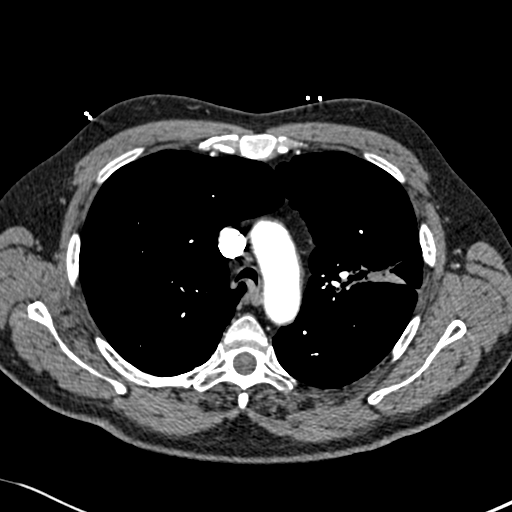
[im 221/299  lung]
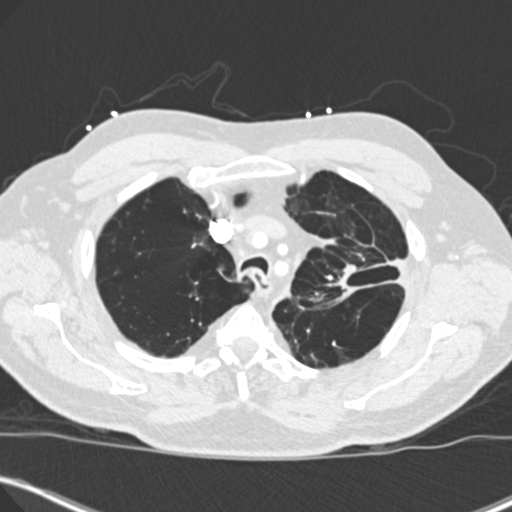
[im 247/299  soft-tissue]
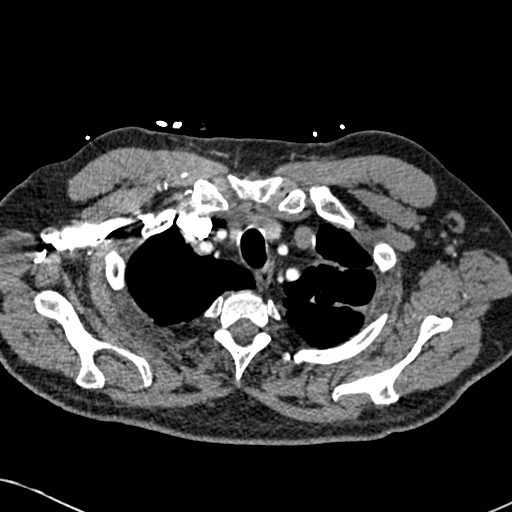
[im 260/299  lung]
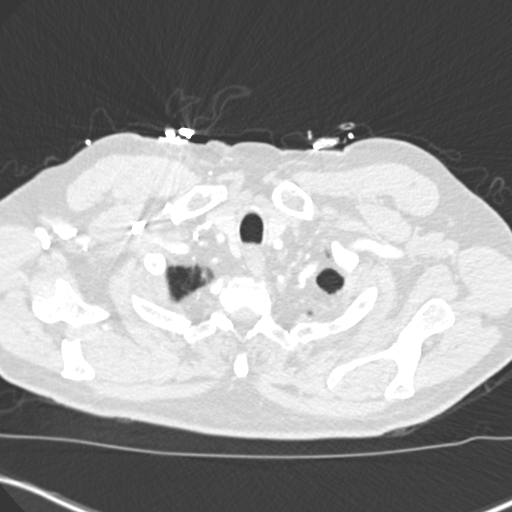
[im 286/299  soft-tissue]
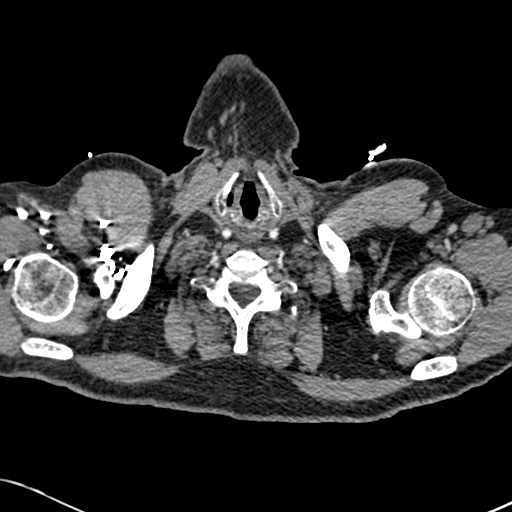

[Series 8: coronal mpr · coronal · 0.59mm/px · 3 of 157 slices shown]
[im 40/157  soft-tissue]
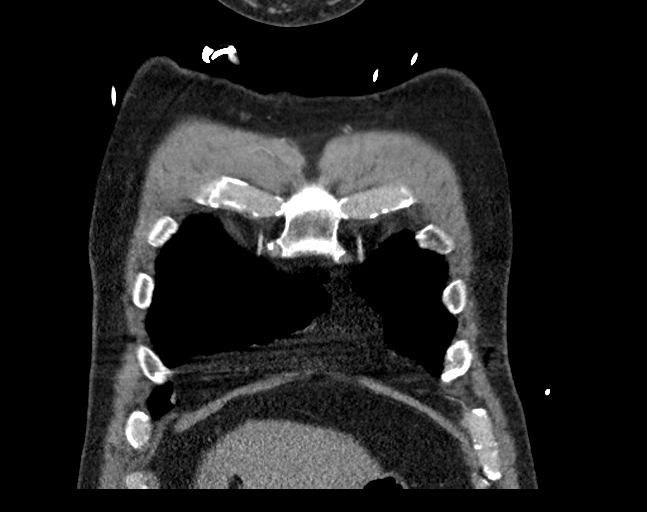
[im 79/157  soft-tissue]
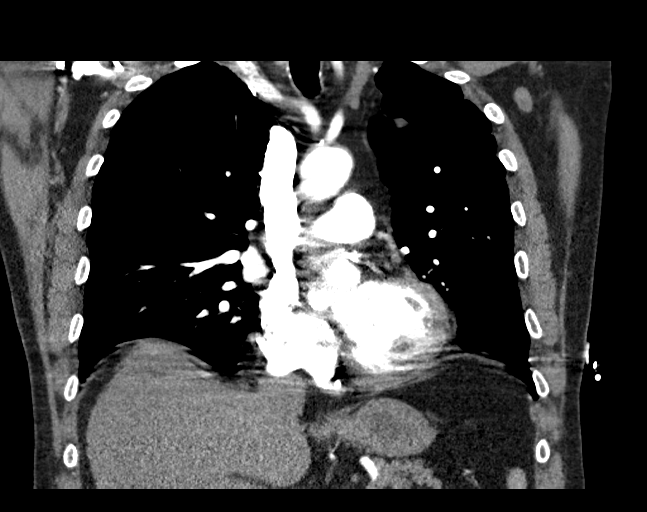
[im 118/157  soft-tissue]
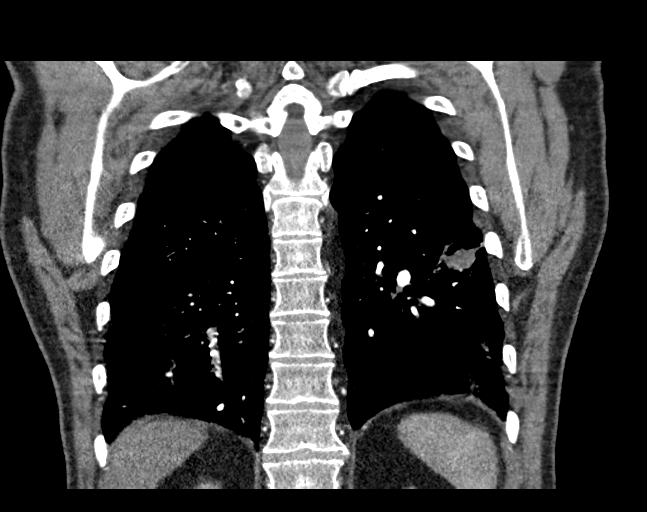

[17 of 46 positions shown; findings below may reference images not displayed]

FINDINGS: Cardiovascular: Satisfactory opacification of the bilateral
pulmonary arteries to the lobar level. No evidence of pulmonary
embolism.

No evidence of thoracic aortic aneurysm or dissection. Mild
atherosclerotic calcifications of the aortic arch.

Mild cord atherosclerosis of the LAD and left circumflex.

Mediastinum/Nodes: No suspicious mediastinal lymphadenopathy.

Visualized thyroid is unremarkable.

Lungs/Pleura: Tracheobronchomalacia.

1.7 x 4.6 cm cavitary left upper lobe lesion (series 7/image 35),
previously 2.7 x 5.2 cm, now with a relatively thin wall. Mild wall
thickening persists inferiorly (series 7/image 48), but overall the
superimposed infection/inflammation has improved.

2.2 x 6.0 cm thick-walled cavitary lesion in the posterior left
lower lobe (series 7/image 90), previously 2.7 x 6.0 cm when
measured in a similar fashion on the prior study, with persistent
but decreased solid component laterally (series 7/image 82).
However, this lesion is progressive when compared to prior CTs from

Extensive centrilobular and paraseptal emphysematous changes.

Mild scarring/atelectasis in the medial right middle lobe. No focal
consolidation.

No new/suspicious pulmonary nodules.

No pleural effusion or pneumothorax.

Upper Abdomen: Visualized upper abdomen is notable for mild hepatic
steatosis.

Musculoskeletal: Visualized osseous structures are within normal
limits.

Review of the MIP images confirms the above findings.
IMPRESSION: No evidence of pulmonary embolism.

Cavitary lesions in the left upper and lower lobes are stable versus
mildly decreased, with decreased superimposed infection/inflammation
when compared to recent priors. However, the left lower lobe lesion
is progressive when compared to 2336-18. As such, attention on
follow-up is suggested.

Tracheobronchomalacia.

Aortic Atherosclerosis (5M8VV-[id number].N) and Emphysema (5M8VV-[id number].O).

## 2019-09-07 ENCOUNTER — Other Ambulatory Visit: Payer: Self-pay

## 2019-09-07 ENCOUNTER — Ambulatory Visit (INDEPENDENT_AMBULATORY_CARE_PROVIDER_SITE_OTHER): Payer: Medicare Other | Admitting: Internal Medicine

## 2019-09-07 ENCOUNTER — Encounter: Payer: Self-pay | Admitting: Internal Medicine

## 2019-09-07 DIAGNOSIS — J9611 Chronic respiratory failure with hypoxia: Secondary | ICD-10-CM

## 2019-09-07 DIAGNOSIS — A310 Pulmonary mycobacterial infection: Secondary | ICD-10-CM | POA: Diagnosis not present

## 2019-09-07 NOTE — Progress Notes (Signed)
   Subjective:    Patient ID: Ryan [name], male    DOB: [birth year], 59 y.o.   MRN: [id number]  HPI I connected with  Ryan [name] on 09/07/19 by phone and verified that I am speaking with the correct person using two identifiers.   I discussed the limitations of evaluation and management by telemedicine. The patient expressed understanding and agreed to proceed.  He was recently hospitalized with an exacerbation of his lung disease and culture from the sputum grew Stenotrophomonas.  He was given a course of bactrim and now completed.  He continues on 3 drug regimen for MAI with clofazimine, azithromycin and rifampin.  He continues to have issues with SOB, cough.  He remains on oxygen therapy at 6L continuous.  He is waiting for an office visit with Dr. [name] when he has an office in [place].  He continues with prednisone 10 mg twice a day which was his baseline previously by Dr. Luan Pulling.  He is starting to feels more sob and getting more anxiety, particularly with coughing fits.  No fever.      Review of Systems  Constitutional: Positive for fatigue. Negative for chills and fever.  Respiratory: Positive for cough and shortness of breath.   Skin: Negative for rash.       Objective:   Physical Exam        Assessment & Plan:

## 2019-09-07 NOTE — Assessment & Plan Note (Addendum)
Now on full nasal cannula with 6 L.  He is feeling a bit worse on his baseline prednisone.  I advised he get into Dr. Gustavus Bryant office, possibly with a televisit if he can't make it in.  I also advised that if he continues to feel poorly with increased cough and sob, to increase the prednisone to 20/10 or 20/20 if needed unless otherwise advised by Dr. [name].   25 minutes spent with on call with the patient and his wife.  rtc 3 months

## 2019-09-07 NOTE — Assessment & Plan Note (Signed)
Repeat sputum from this January remains negative and smears negative.  I will have him continue with the 3 drug regimen through June then stop.   rtc 3 months.

## 2019-09-09 LAB — FUNGUS CULTURE WITH STAIN

## 2019-09-09 LAB — FUNGUS CULTURE RESULT

## 2019-09-09 LAB — FUNGAL ORGANISM REFLEX

## 2019-09-10 DIAGNOSIS — J449 Chronic obstructive pulmonary disease, unspecified: Secondary | ICD-10-CM | POA: Diagnosis not present

## 2019-09-10 DIAGNOSIS — Z760 Encounter for issue of repeat prescription: Secondary | ICD-10-CM | POA: Diagnosis not present

## 2019-09-10 DIAGNOSIS — I5032 Chronic diastolic (congestive) heart failure: Secondary | ICD-10-CM | POA: Diagnosis not present

## 2019-09-22 DIAGNOSIS — J449 Chronic obstructive pulmonary disease, unspecified: Secondary | ICD-10-CM | POA: Diagnosis not present

## 2019-09-24 ENCOUNTER — Other Ambulatory Visit: Payer: Self-pay | Admitting: Gastroenterology

## 2019-09-29 DIAGNOSIS — Z9189 Other specified personal risk factors, not elsewhere classified: Secondary | ICD-10-CM | POA: Diagnosis not present

## 2019-09-29 DIAGNOSIS — M545 Low back pain: Secondary | ICD-10-CM | POA: Diagnosis not present

## 2019-09-29 DIAGNOSIS — Z515 Encounter for palliative care: Secondary | ICD-10-CM | POA: Diagnosis not present

## 2019-09-30 LAB — ACID FAST CULTURE WITH REFLEXED SENSITIVITIES (MYCOBACTERIA)
Acid Fast Culture: NEGATIVE
Acid Fast Culture: NEGATIVE
Acid Fast Culture: NEGATIVE

## 2019-10-23 DIAGNOSIS — J449 Chronic obstructive pulmonary disease, unspecified: Secondary | ICD-10-CM | POA: Diagnosis not present

## 2019-10-30 DIAGNOSIS — M545 Low back pain: Secondary | ICD-10-CM | POA: Diagnosis not present

## 2019-10-30 DIAGNOSIS — Z79891 Long term (current) use of opiate analgesic: Secondary | ICD-10-CM | POA: Diagnosis not present

## 2019-10-30 DIAGNOSIS — Z79899 Other long term (current) drug therapy: Secondary | ICD-10-CM | POA: Diagnosis not present

## 2019-10-30 DIAGNOSIS — R5383 Other fatigue: Secondary | ICD-10-CM | POA: Diagnosis not present

## 2019-10-30 IMAGING — DX DG CHEST 2V
2 series · 2 of 2 positions shown · non-contrast
Comparison: CT scan June 19, 2018 and chest x-ray from the same
day.

CLINICAL DATA: Shortness of breath.

EXAM:
CHEST - 2 VIEW

[chest pa]
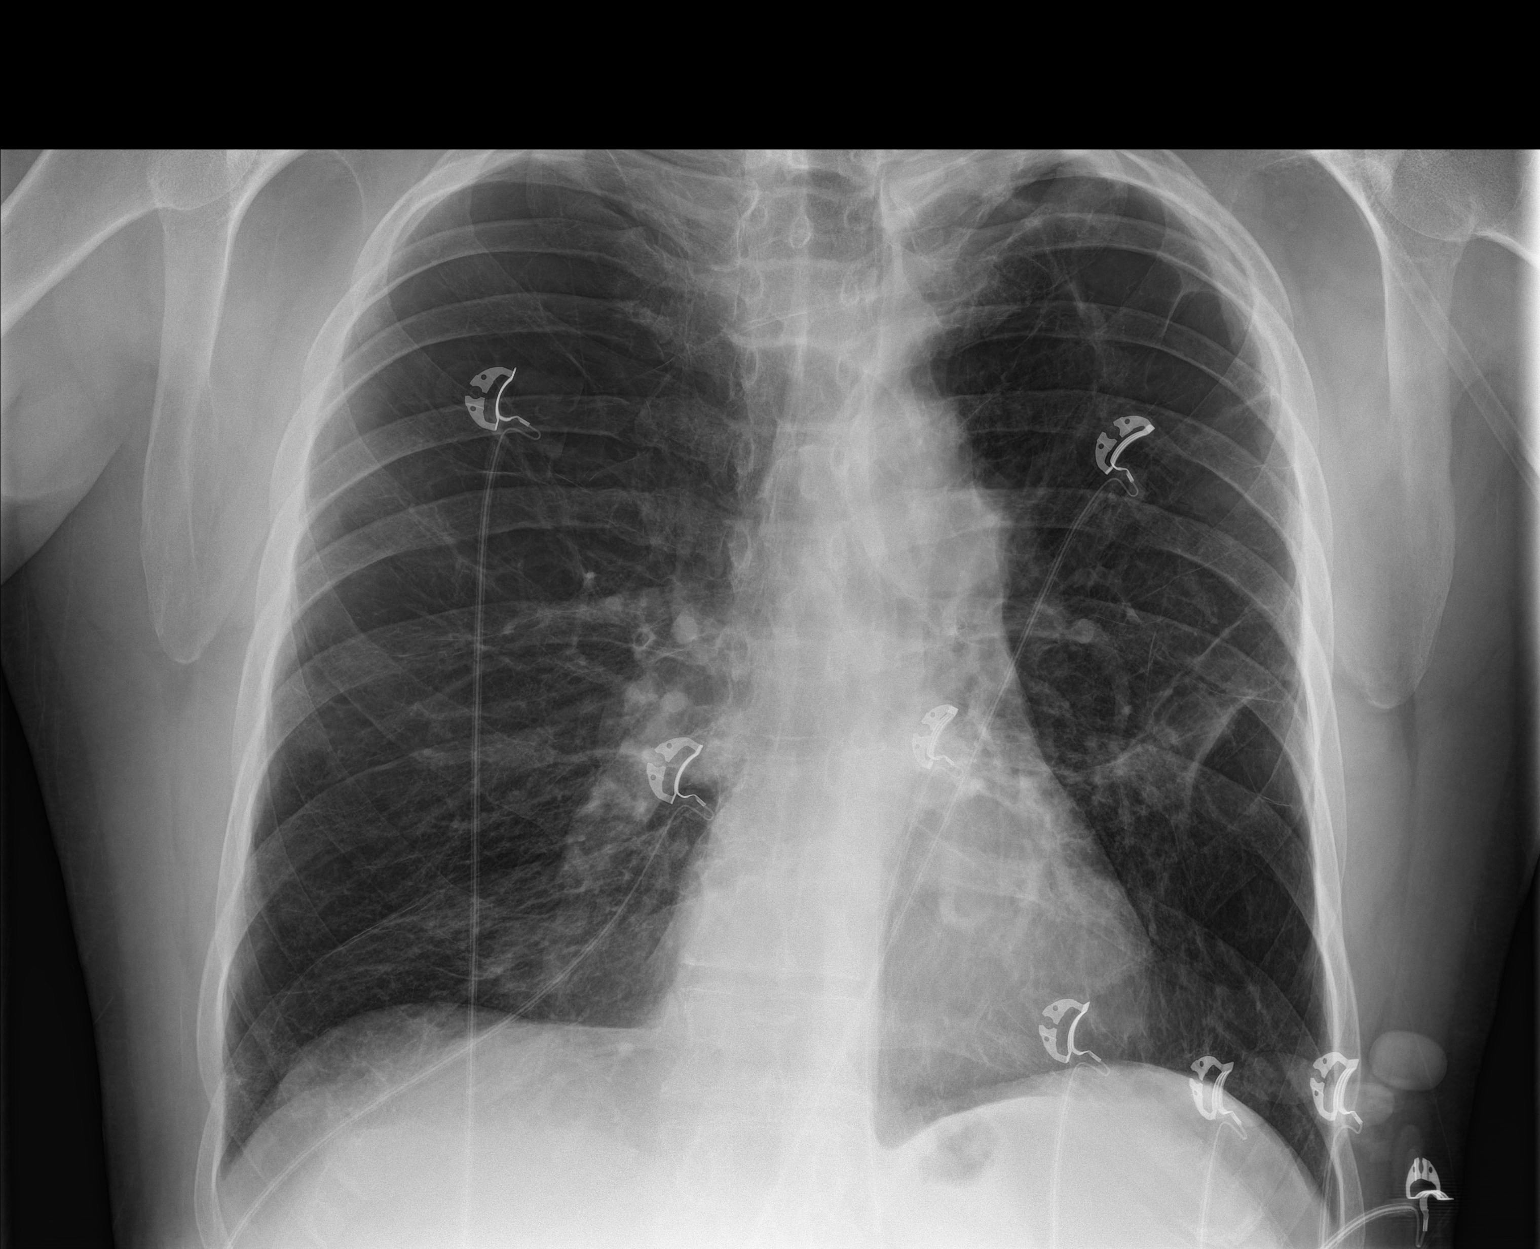

[chest lat]
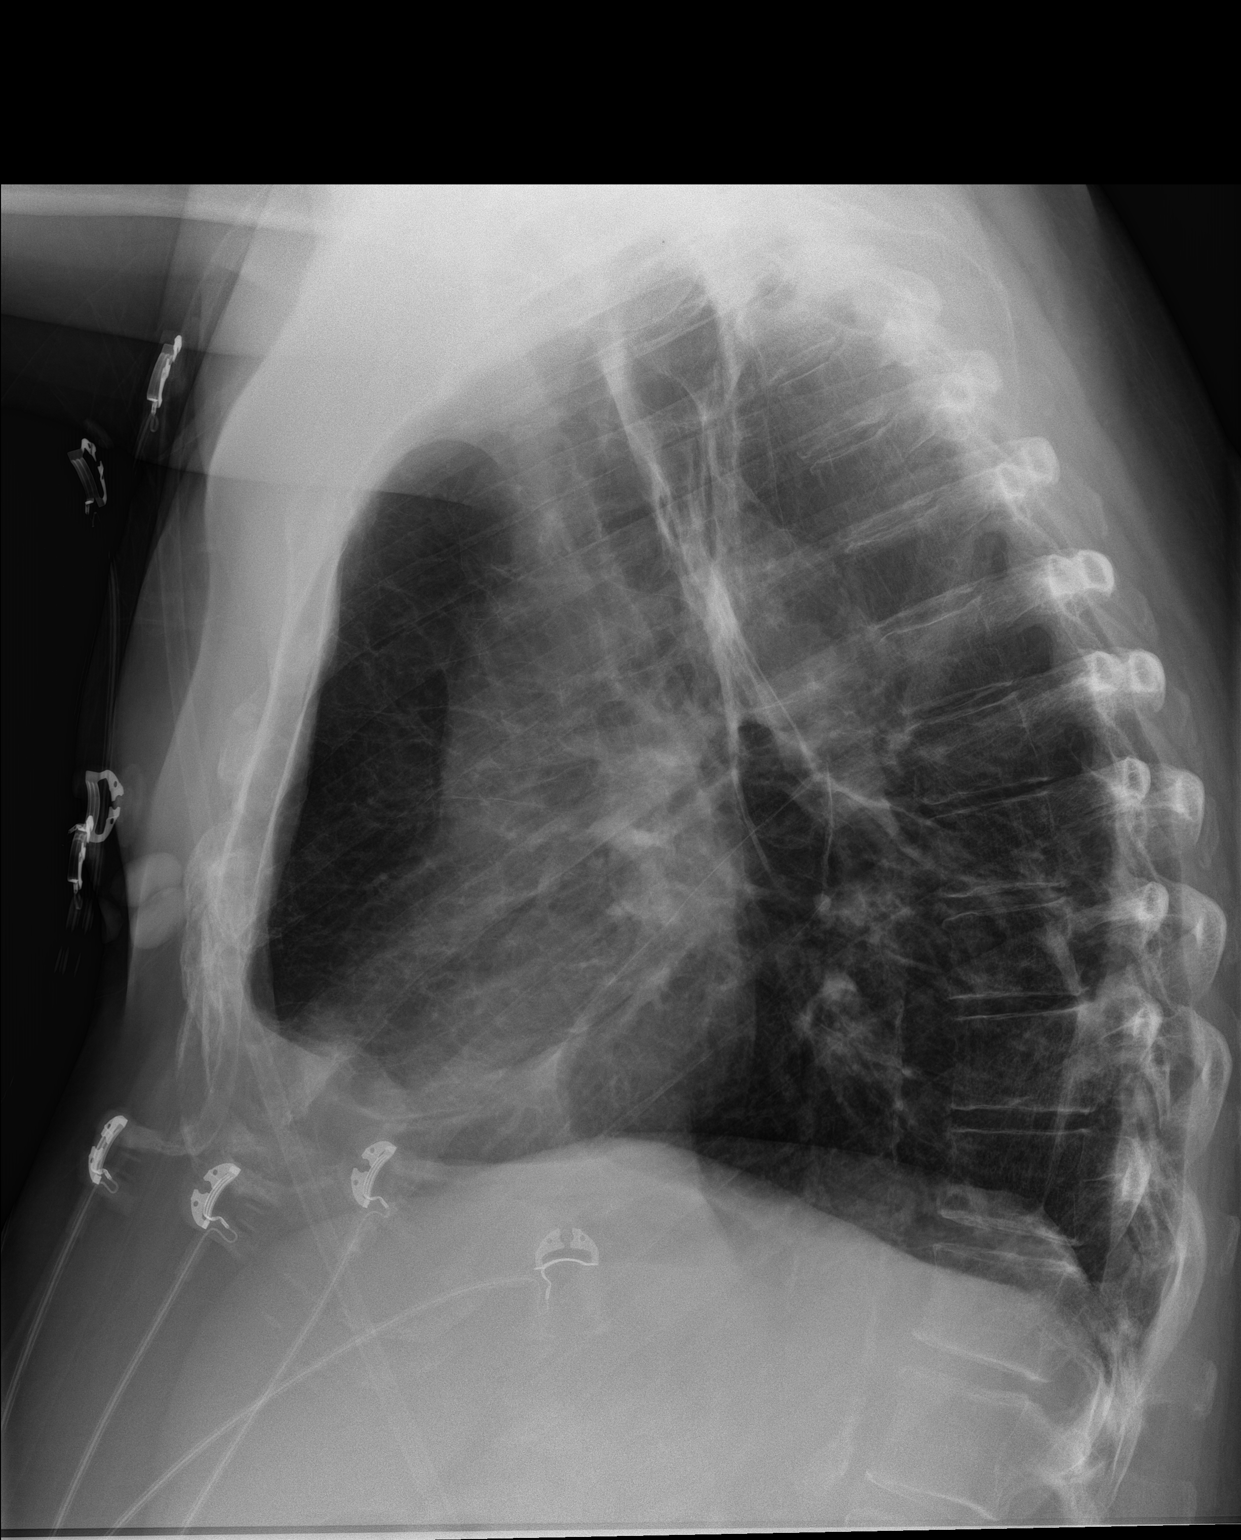

[2 of 2 positions shown; findings below may reference images not displayed]

FINDINGS: The patient has known left-sided cavitary lesions are better
assessed on previous CT imaging. Opacity in left mid lung has
improved but persists. The heart, hila, mediastinum, and pleura are
otherwise unremarkable. Mild opacity in the medial right lung base
is chronic. No acute infiltrate.
IMPRESSION: No acute interval changes. The patient's known left-sided cavitary
lesions are better assessed with CT imaging.

## 2019-10-30 IMAGING — CT CT ABD-PELV W/ CM
2 of 5 series · 16 of 46 positions shown, 18 images · IV contrast (Isovue)
Comparison: 04/22/2018

CLINICAL DATA: Left-sided abdominal pain.

EXAM:
CT ABDOMEN AND PELVIS WITH CONTRAST
TECHNIQUE: Multidetector CT imaging of the abdomen and pelvis was performed
using the standard protocol following bolus administration of
intravenous contrast.
CONTRAST:  100mL [id number] IOPAMIDOL (4ELQJ9-033) INJECTION 61%

[Series 2: axial st · axial · 0.81mm/px · z∈[+832,+1242]mm · 13 of 94 slices shown, 15 images]
[im 6/94  soft-tissue]
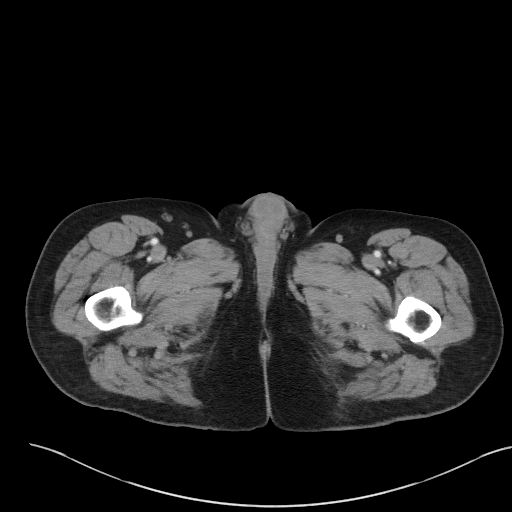
[im 6/94  bone]
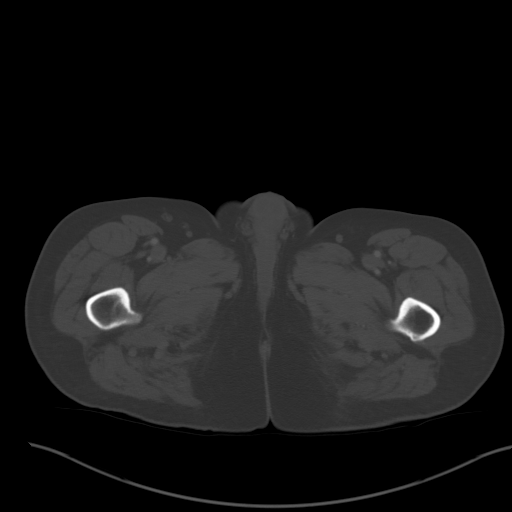
[im 11/94  soft-tissue]
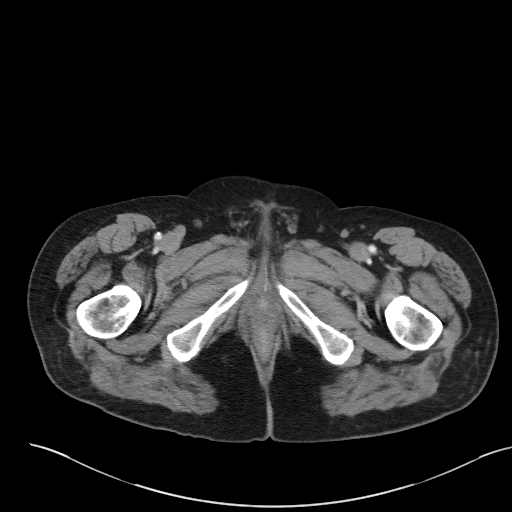
[im 21/94  soft-tissue]
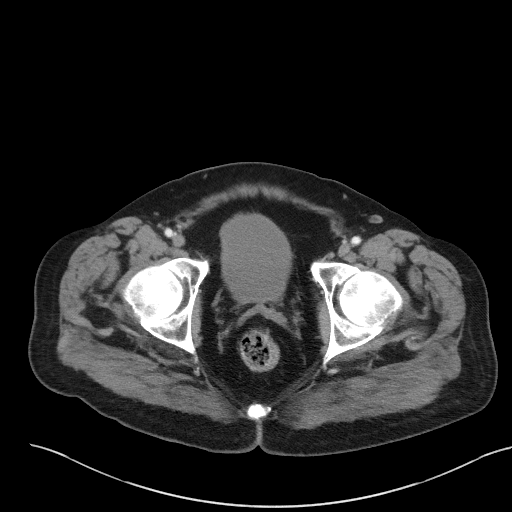
[im 26/94  soft-tissue]
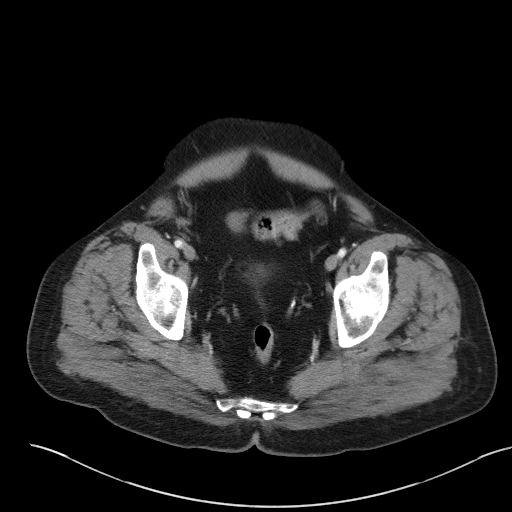
[im 32/94  soft-tissue]
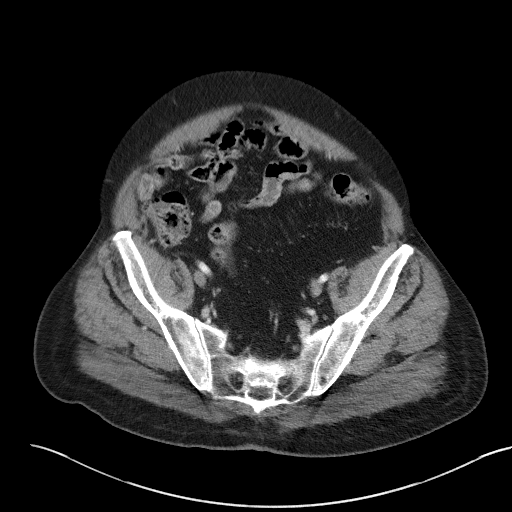
[im 42/94  soft-tissue]
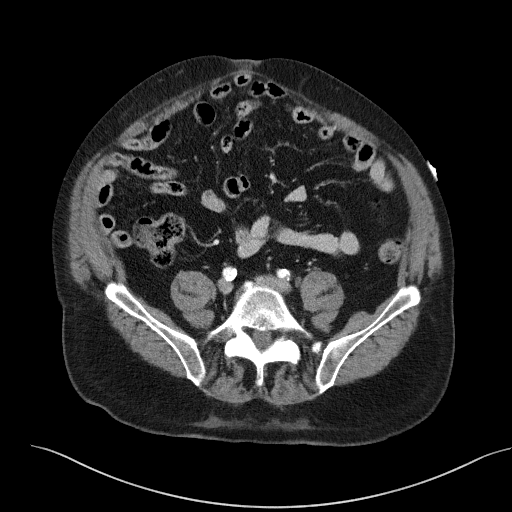
[im 47/94  soft-tissue]
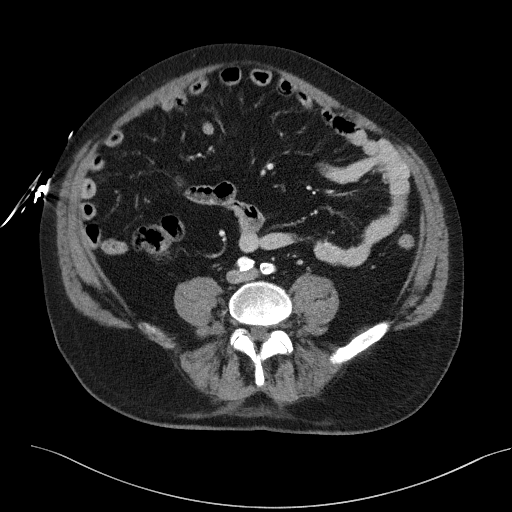
[im 52/94  soft-tissue]
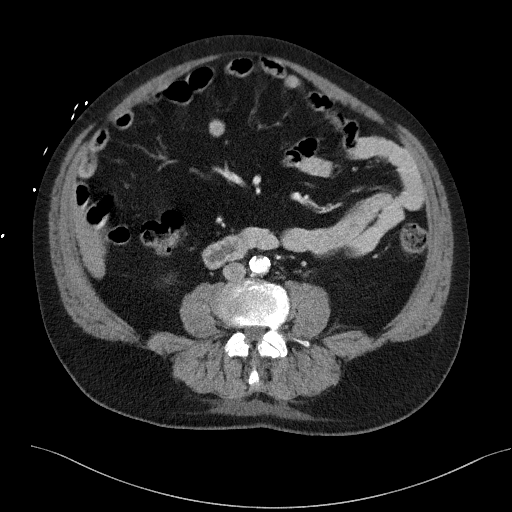
[im 63/94  soft-tissue]
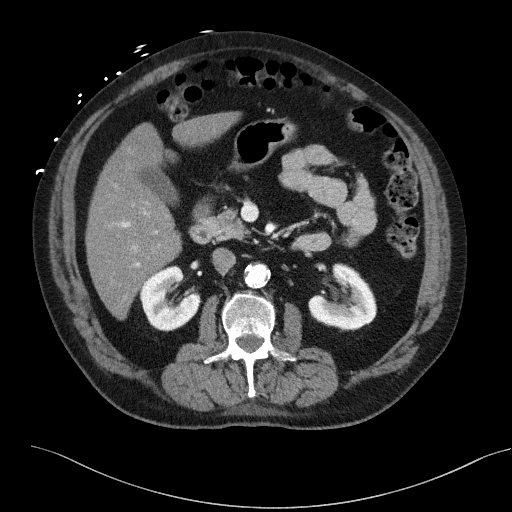
[im 63/94  bone]
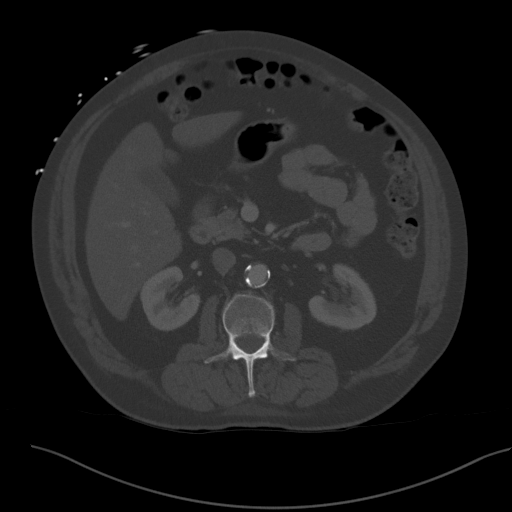
[im 68/94  soft-tissue]
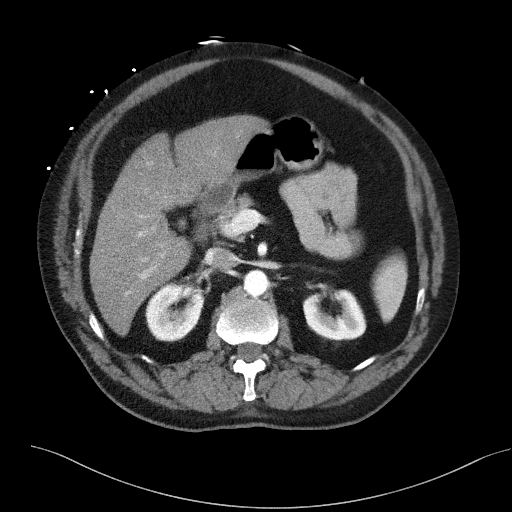
[im 73/94  soft-tissue]
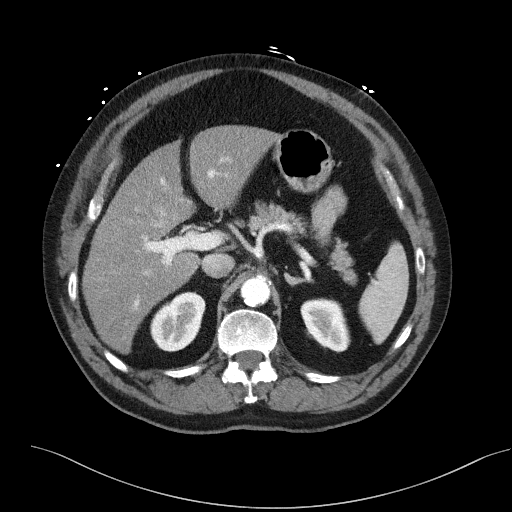
[im 83/94  soft-tissue]
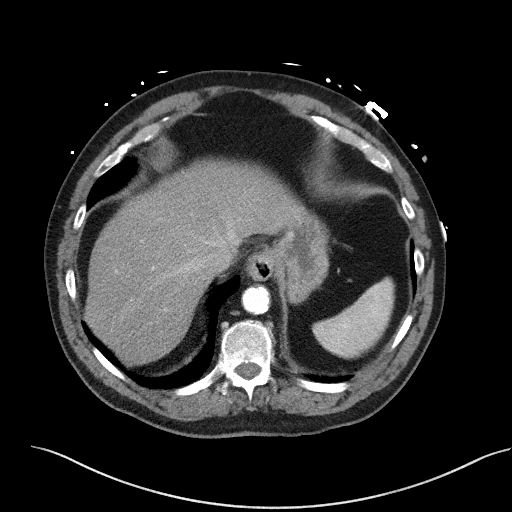
[im 88/94  soft-tissue]
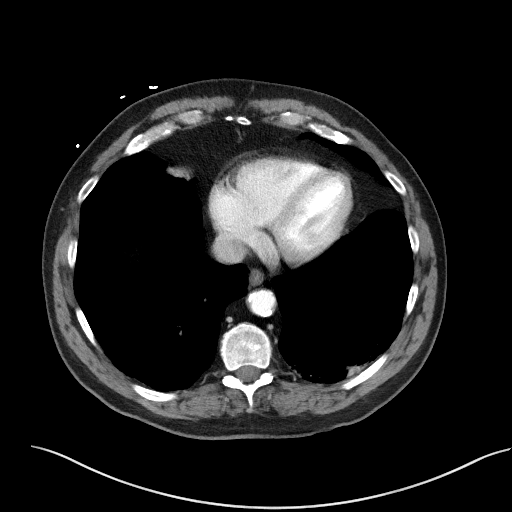

[Series 6: coronal st · coronal · 0.65mm/px · 3 of 103 slices shown]
[im 35/103  soft-tissue]
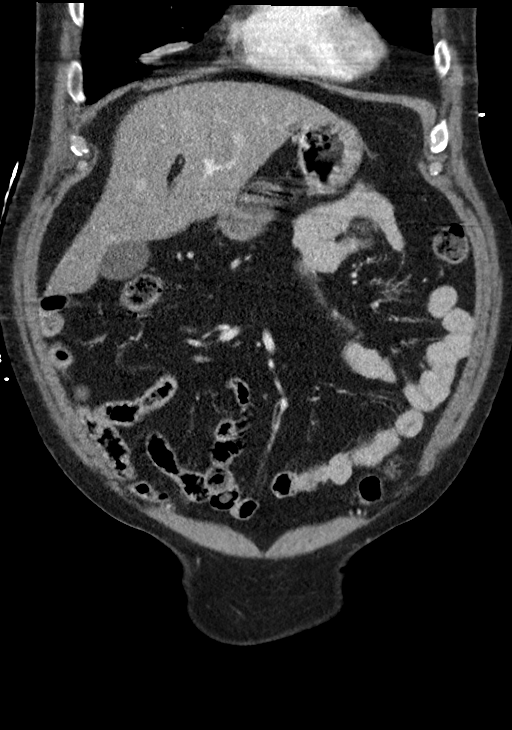
[im 46/103  soft-tissue]
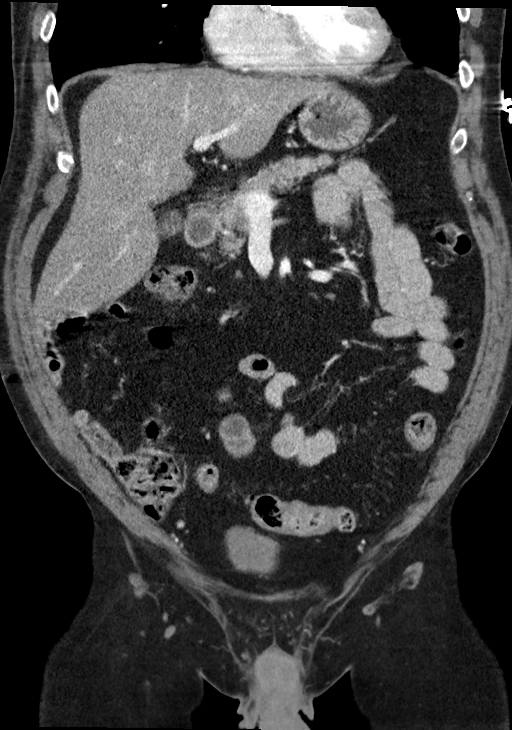
[im 57/103  soft-tissue]
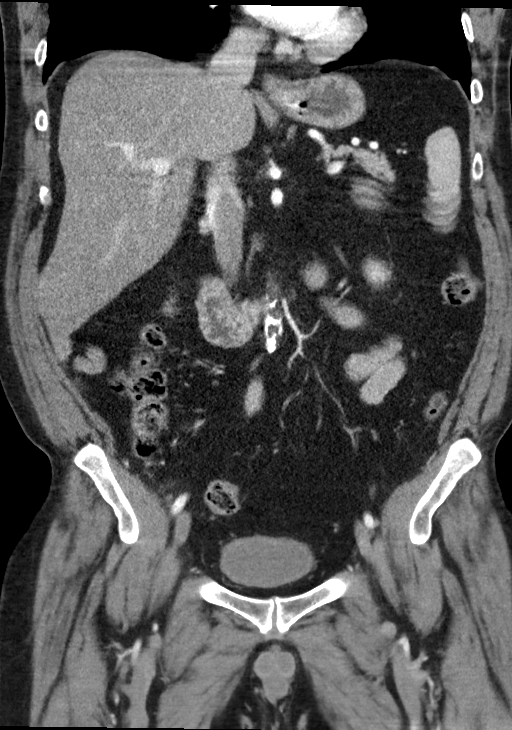

[16 of 46 positions shown; findings below may reference images not displayed]

FINDINGS: Lower chest: Stable scarring at the left lung base.

Hepatobiliary: Stable diffuse hepatic steatosis without overt
cirrhotic morphology of the liver. No hepatic masses or biliary
ductal dilatation. The gallbladder appears unremarkable.

Pancreas: Unremarkable. No pancreatic ductal dilatation or
surrounding inflammatory changes.

Spleen: Normal in size without focal abnormality.

Adrenals/Urinary Tract: Adrenal glands are unremarkable. Kidneys are
normal, without renal calculi, focal lesion, or hydronephrosis.
Bladder is unremarkable.

Stomach/Bowel: Probable small hiatal hernia. No evidence of bowel
obstruction, ileus, inflammation or lesion. No free air or focal
abscess identified.

Vascular/Lymphatic: Calcified plaque of the abdominal aorta and
iliac arteries without evidence of aneurysm or significant stenosis.

Reproductive: Prostate is unremarkable.

Other: No ascites. Umbilical hernia contains a loop of small bowel
without evidence of incarceration.

Musculoskeletal: No acute or significant osseous findings.
IMPRESSION: 1. Stable hepatic steatosis.
2. Umbilical hernia containing a loop of small bowel without
evidence of incarceration.
3. Small hiatal hernia.

## 2019-11-03 IMAGING — DX DG CHEST 2V
2 series · 2 of 2 positions shown · non-contrast
Comparison: Chest radiograph August 12, 2018

CLINICAL DATA: Cough and congestion for a few days. History of
pneumonia.

EXAM:
CHEST - 2 VIEW

[chest pa]
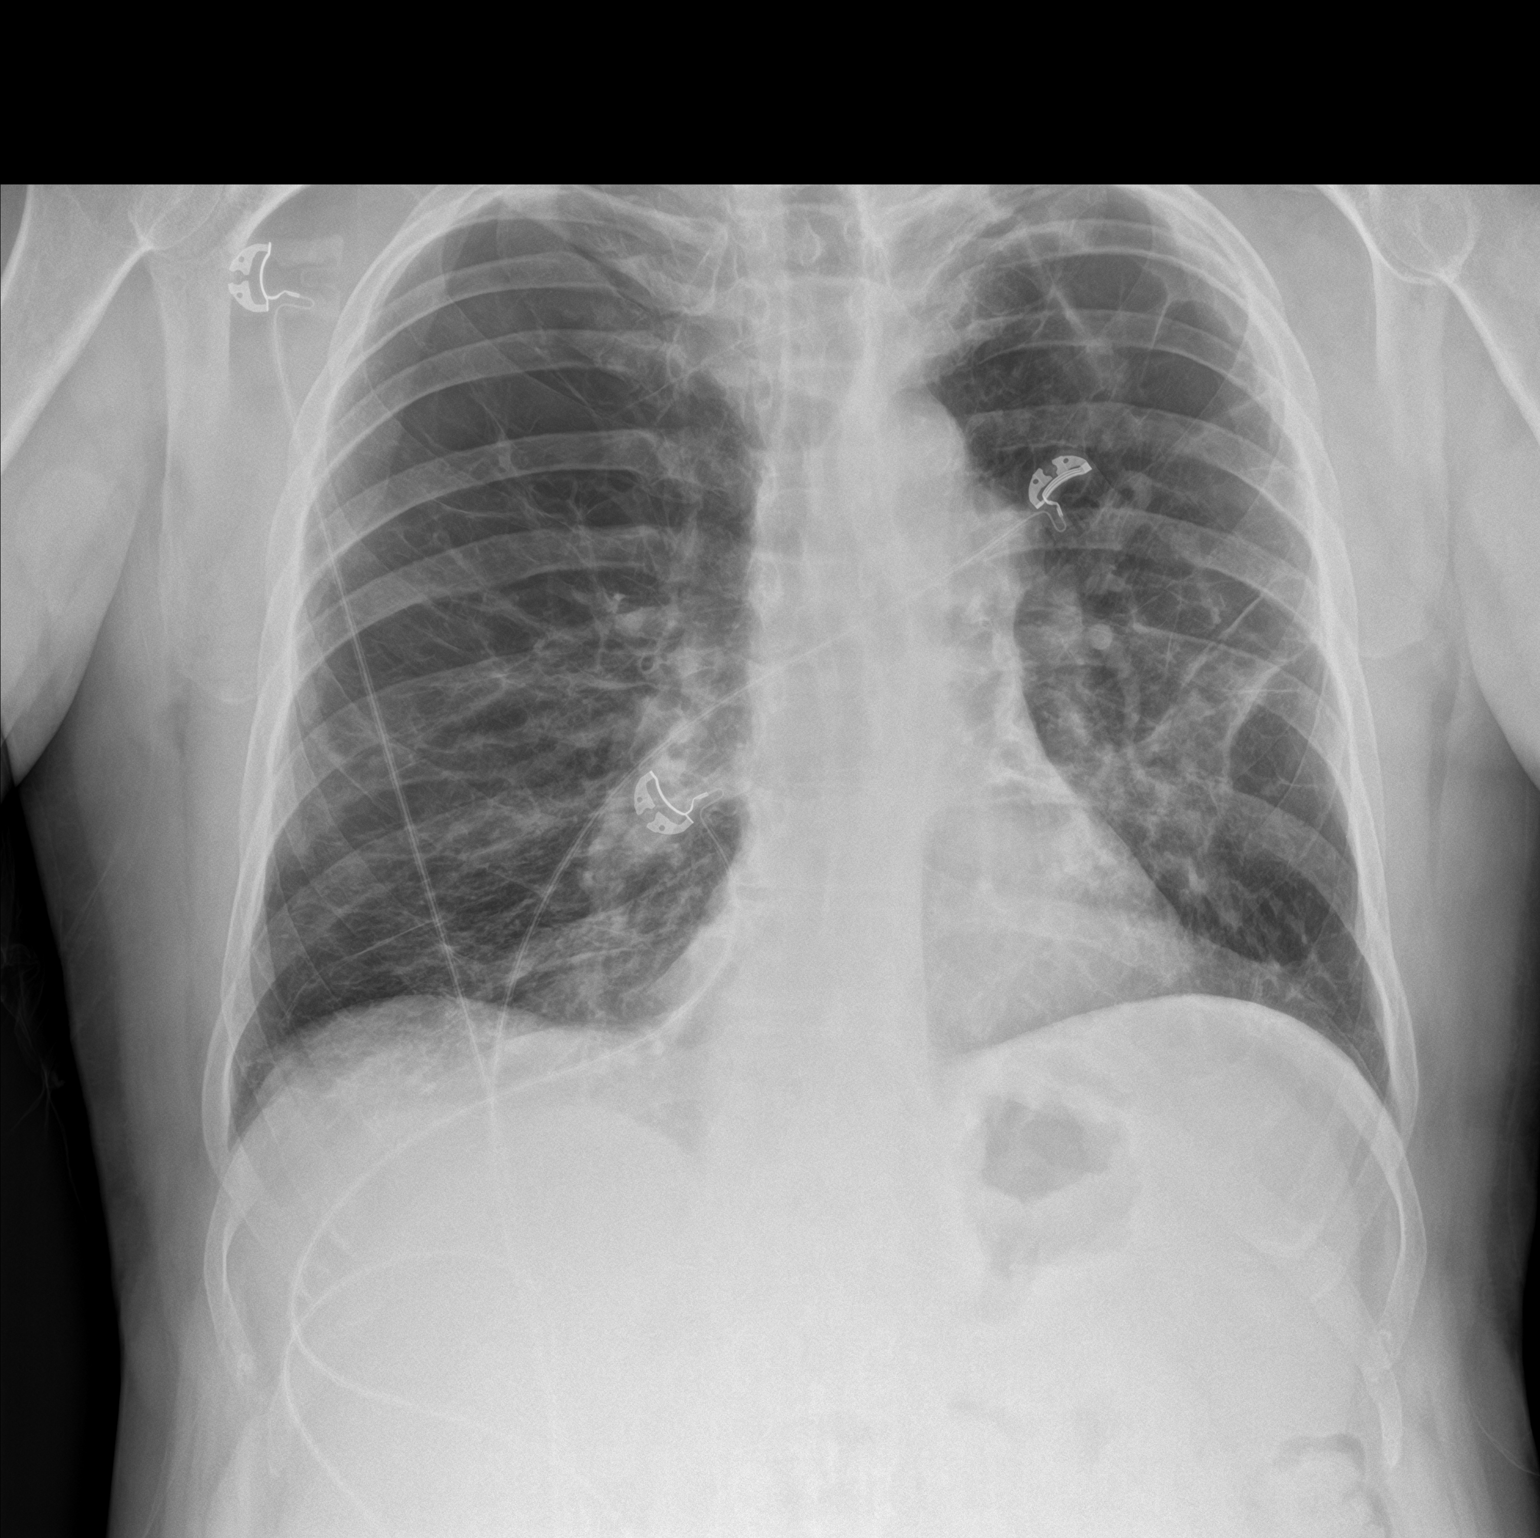

[chest lat]
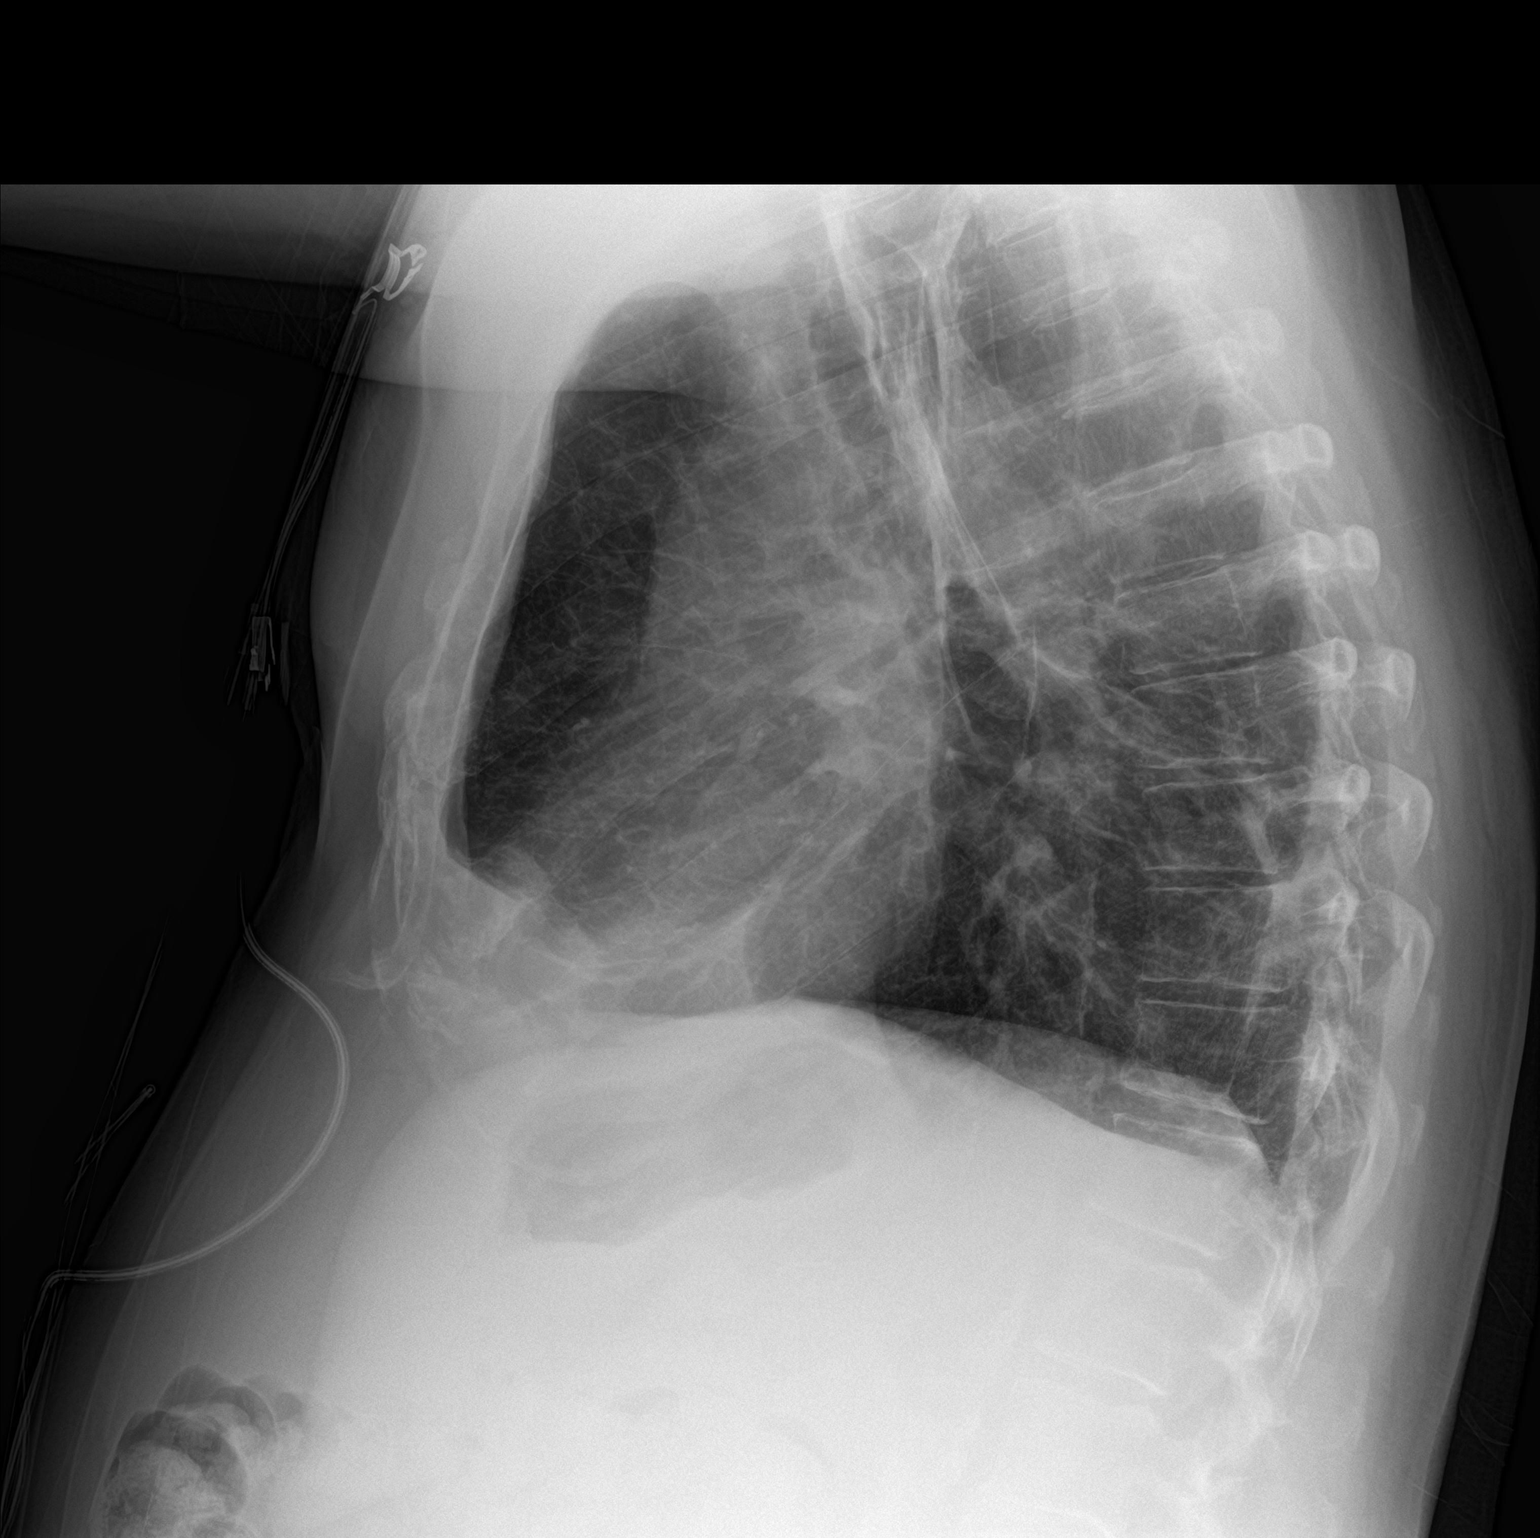

[2 of 2 positions shown; findings below may reference images not displayed]

FINDINGS: Cardiomediastinal silhouette is normal. Calcified aortic arch.
Similar LEFT mid lung zone scarring with chronic interstitial
changes and apical bulla. Mild hyperinflation. No pleural effusion
or focal consolidation. No pneumothorax. Soft tissue planes and
included osseous structures are unchanged.
IMPRESSION: 1. Stable chronic changes without acute cardiopulmonary process.
2. Emphysema (S2LEP-1RB.W).
3.  Aortic Atherosclerosis (S2LEP-TPL.L).

## 2019-11-07 ENCOUNTER — Telehealth: Payer: Self-pay | Admitting: Pharmacist

## 2019-11-07 NOTE — Telephone Encounter (Signed)
Called patient regarding clofazimine refill as Dr. Henreitta Leber last note states for him to be on it until June. No answer, no VM. I have clofazimine here for him.

## 2019-11-20 IMAGING — CT CT ANGIO CHEST
2 of 6 series · 18 of 46 positions shown · IV contrast (Isovue)
Comparison: 06/19/2018, 02/12/2017

CLINICAL DATA: Chronic dyspnea, shortness of breath, COPD, on
oxygen therapy, difficulty breathing worse supine, worsening
breathing since June 2018; history hypertension, type II
diabetes mellitus, atrial fibrillation, collagen vascular disease,
former smoker

EXAM:
CT ANGIOGRAPHY CHEST WITH CONTRAST
TECHNIQUE: Multidetector CT imaging of the chest was performed using the
standard protocol during bolus administration of intravenous
contrast. Multiplanar CT image reconstructions and MIPs were
obtained to evaluate the vascular anatomy.
CONTRAST:  100mL [id number] IOPAMIDOL ([id number]) INJECTION 76% IV

[Series 5: thins · axial · 0.67mm/px · z∈[+1321,+1621]mm · 15 of 330 slices shown]
[im 15/330  lung]
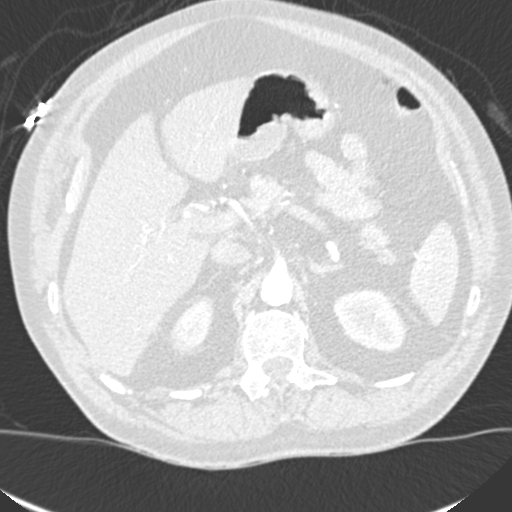
[im 43/330  soft-tissue]
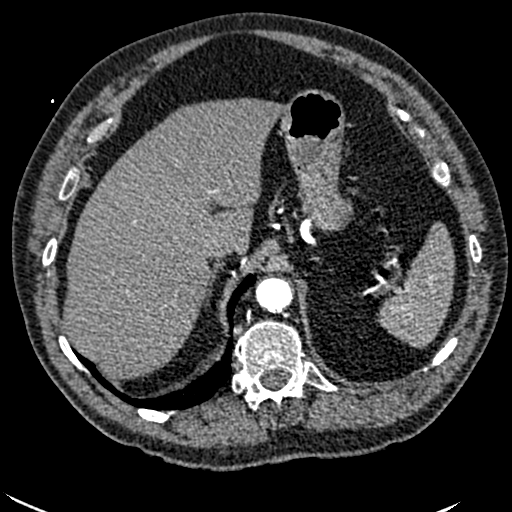
[im 58/330  lung]
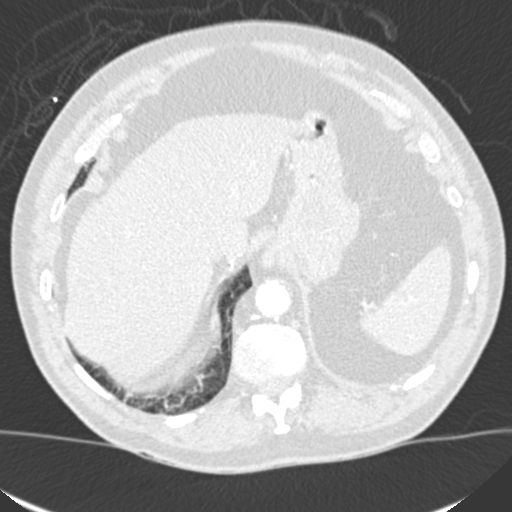
[im 86/330  soft-tissue]
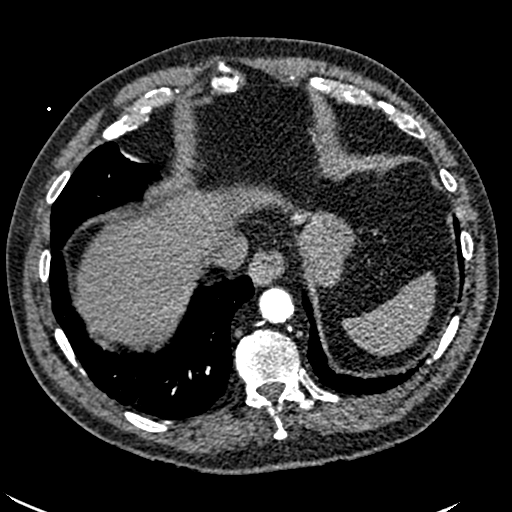
[im 101/330  lung]
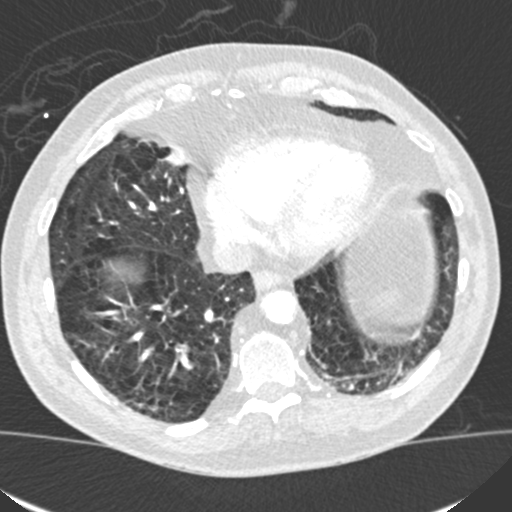
[im 129/330  soft-tissue]
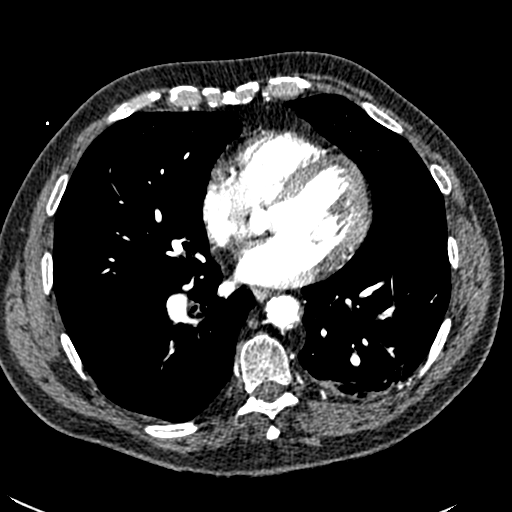
[im 144/330  lung]
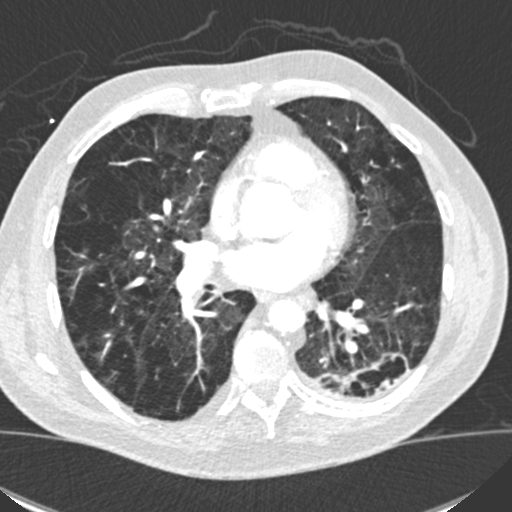
[im 172/330  soft-tissue]
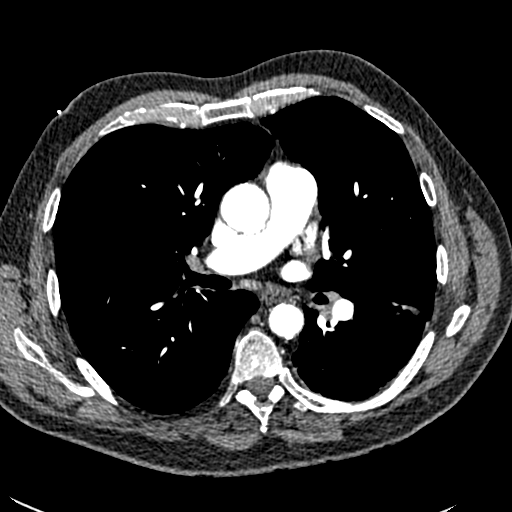
[im 186/330  lung]
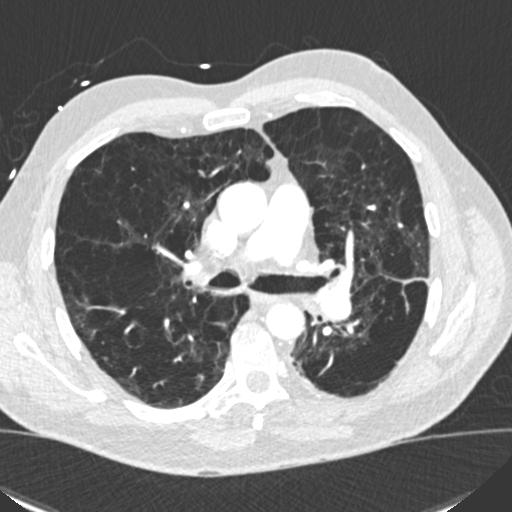
[im 201/330  soft-tissue]
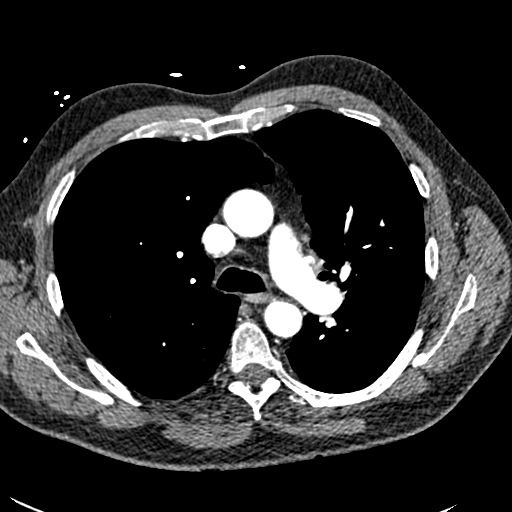
[im 229/330  lung]
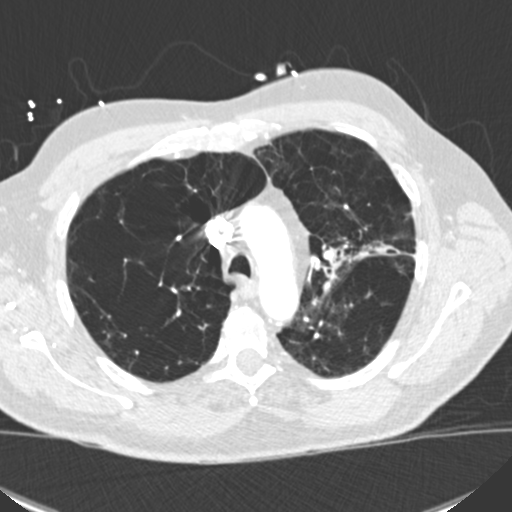
[im 244/330  soft-tissue]
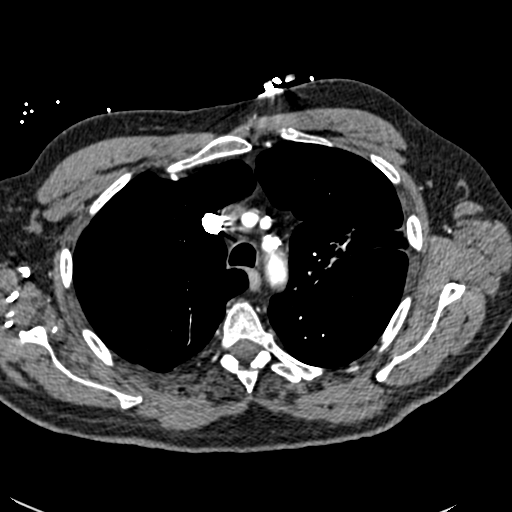
[im 272/330  lung]
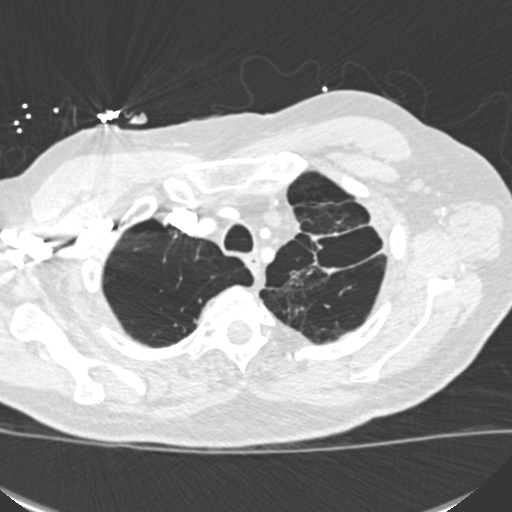
[im 287/330  soft-tissue]
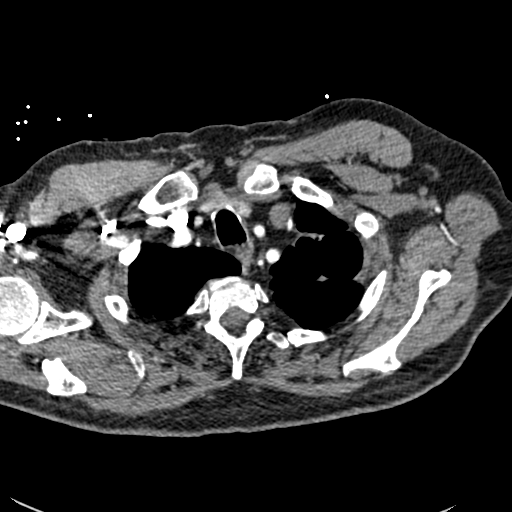
[im 315/330  lung]
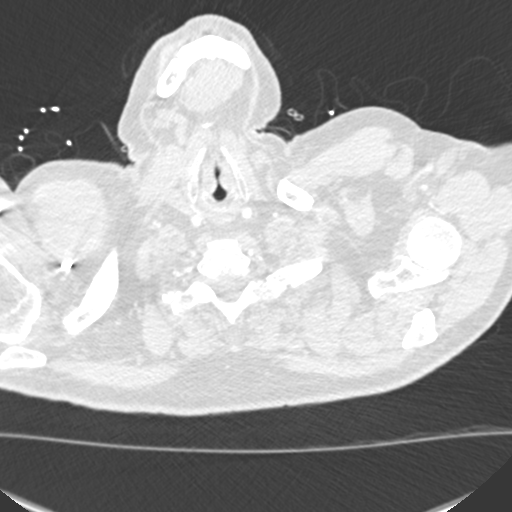

[Series 7: coronal mpr · coronal · 0.64mm/px · 3 of 151 slices shown]
[im 38/151  soft-tissue]
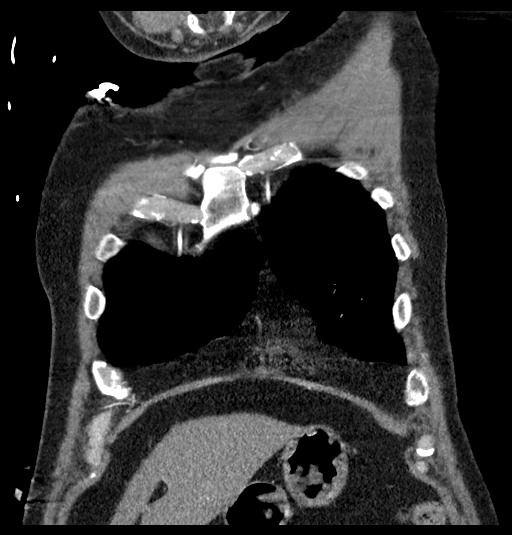
[im 76/151  soft-tissue]
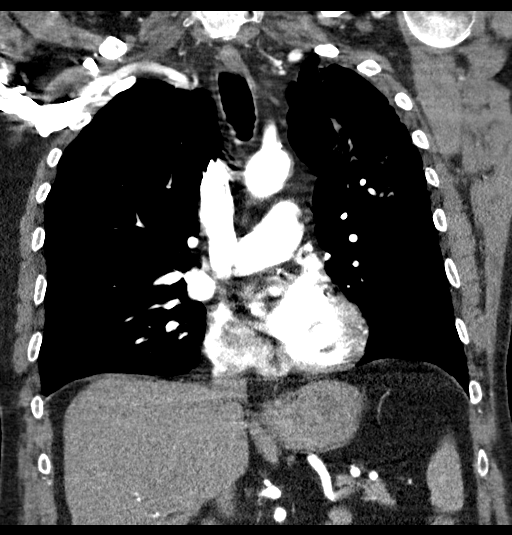
[im 113/151  soft-tissue]
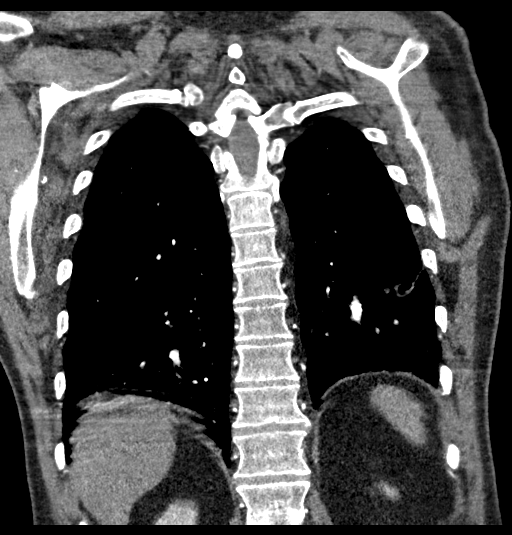

[18 of 46 positions shown; findings below may reference images not displayed]

FINDINGS: Cardiovascular: Atherosclerotic calcifications of thoracic aorta and
coronary arteries. Aorta normal caliber. No aneurysm or dissection.
Pulmonary arteries well opacified and patent. No evidence of
pulmonary embolism. No pericardial effusion.

Mediastinum/Nodes: Esophagus unremarkable. Tiny hiatal hernia. Base
of cervical region normal appearance. No thoracic adenopathy.

Lungs/Pleura: Severe emphysematous changes with bullet the apices.
LEFT upper lobe scarring. Again identified slightly thicker walled
cavitary focus at LEFT apex unchanged, with an area of dependent
density or nodularity along the posterior aspect unchanged since
previous exam as well as an earlier study from 6150. Additional
parenchymal scarring in LEFT lower lobe, likely resolving infected
bulla with thinner wall and less fluid/thickening than on previous
exam. Minimal dependent RIGHT basilar atelectasis. No acute
infiltrate, pleural effusion or pneumothorax. No definite pulmonary
mass/nodule.

Upper Abdomen: Unremarkable

Musculoskeletal: Demineralized, otherwise unremarkable

Review of the MIP images confirms the above findings.
IMPRESSION: No evidence pulmonary embolism.

Scattered atherosclerotic calcifications aorta and coronary
arteries.

COPD changes with bullous disease at apices and at RIGHT base with
scattered areas of parenchymal scarring and atelectasis grossly
stable since prior study.

Chronic thickening of the walls of a bulla/cavitary focus at the
LEFT apex appears unchanged since 6150, recommend attention on
follow-up imaging.

Aortic Atherosclerosis (T6SY4-RZQ.Q) and Emphysema (T6SY4-NYA.Q).

## 2019-11-21 NOTE — Telephone Encounter (Signed)
Patient's wife wants to pick up Clofazamine medication Tuesday morning 11/21/2019.  She was made aware that at the same time, they need to make an appointment with Dr. Linus Salmons.  Venida Jarvis. Nadara Mustard [place] Patient [facility] Phone: [phone] Fax:  [phone]

## 2019-11-22 ENCOUNTER — Telehealth: Payer: Self-pay | Admitting: Pharmacist

## 2019-11-22 DIAGNOSIS — J449 Chronic obstructive pulmonary disease, unspecified: Secondary | ICD-10-CM | POA: Diagnosis not present

## 2019-11-22 NOTE — Telephone Encounter (Signed)
Patient's wife stopped by the clinic today and picked up 2 bottles of clofazimine for the patient. I reminded her that he needs a follow up with Dr. Linus Salmons and she scheduled one for 5/26.

## 2019-11-23 IMAGING — CR DG FOOT COMPLETE 3+V*R*
1 series · 3 of 3 positions shown · non-contrast
Comparison: 05/01/2015

CLINICAL DATA: Right foot pain and swelling.

EXAM:
RIGHT FOOT COMPLETE - 3+ VIEW

[Series 1: ap · 0.17mm/px · 3 of 3 slices shown]
[im 1/3]
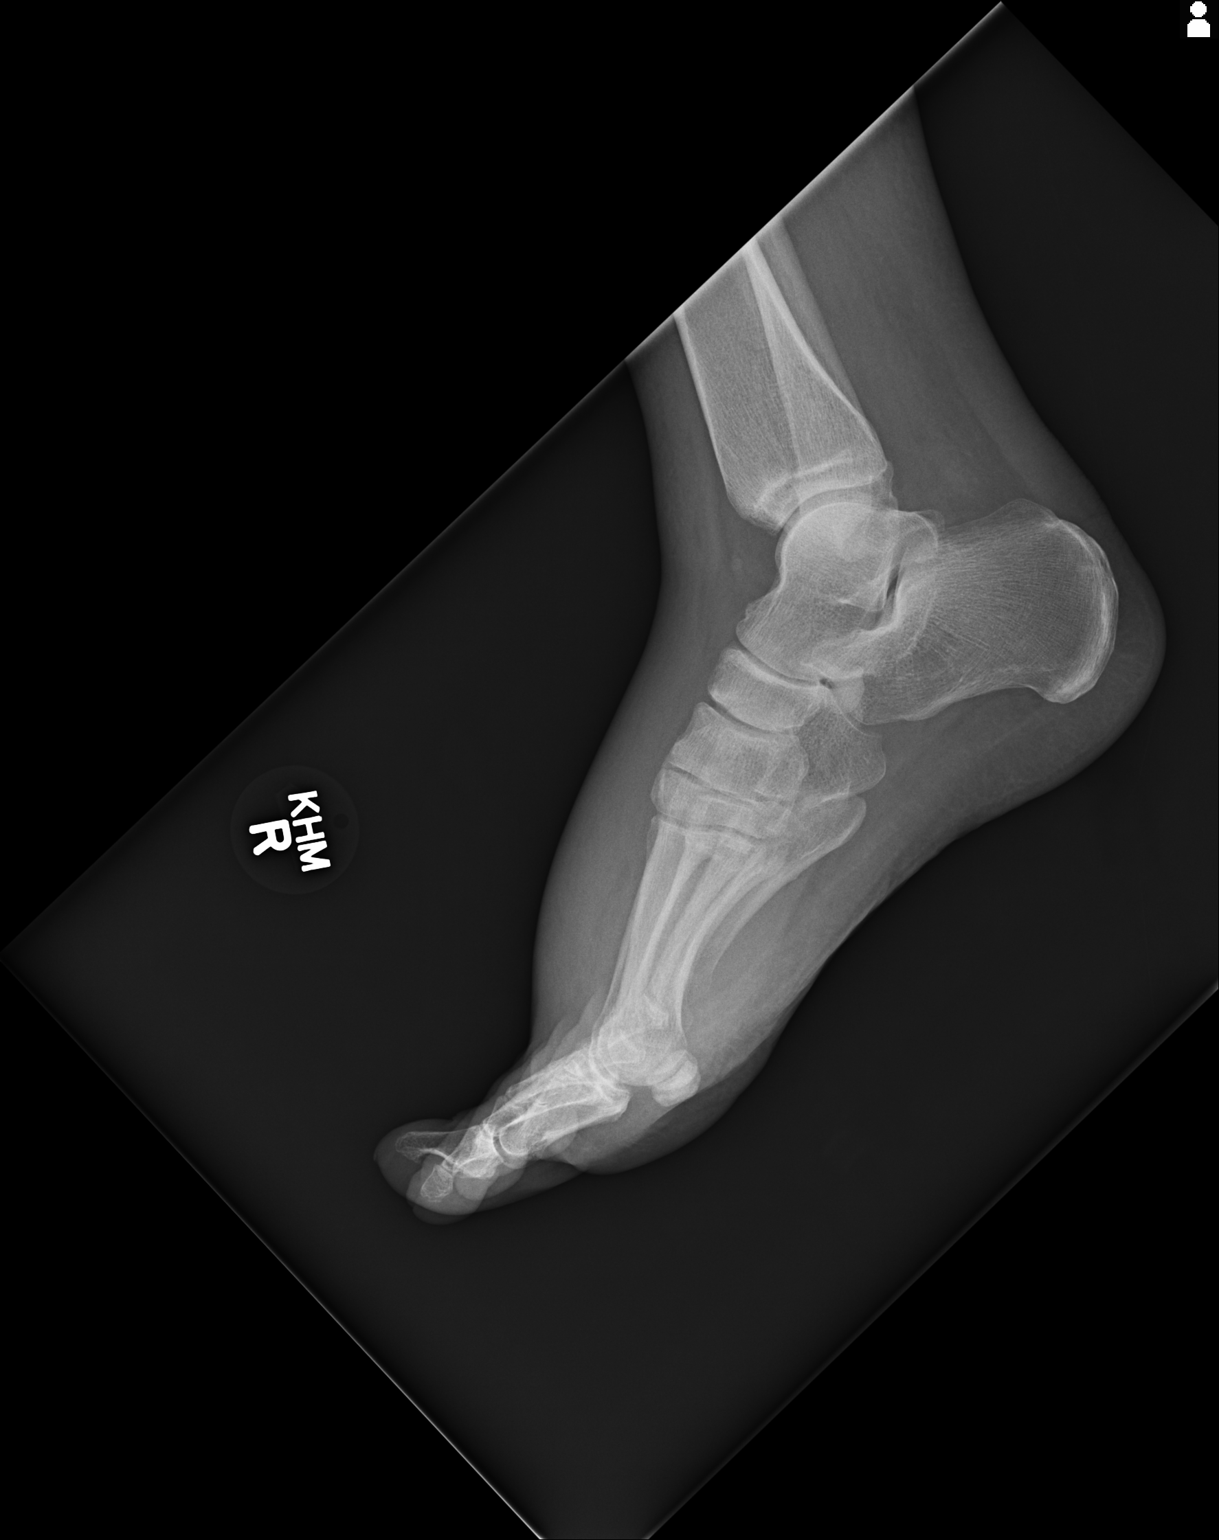
[im 2/3]
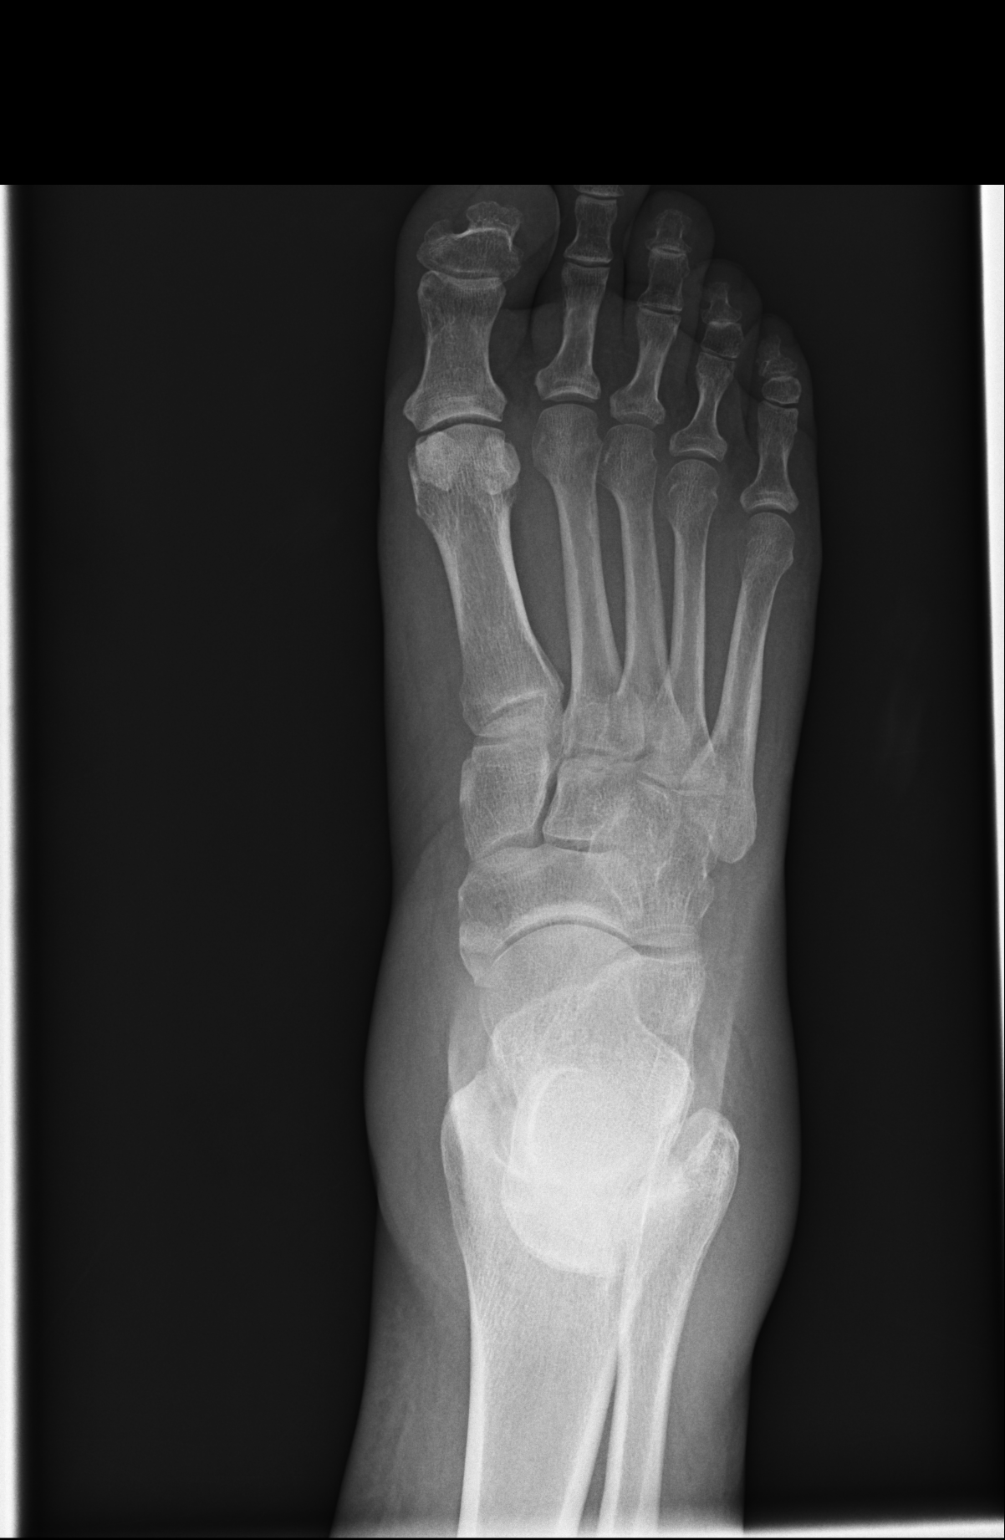
[im 3/3]
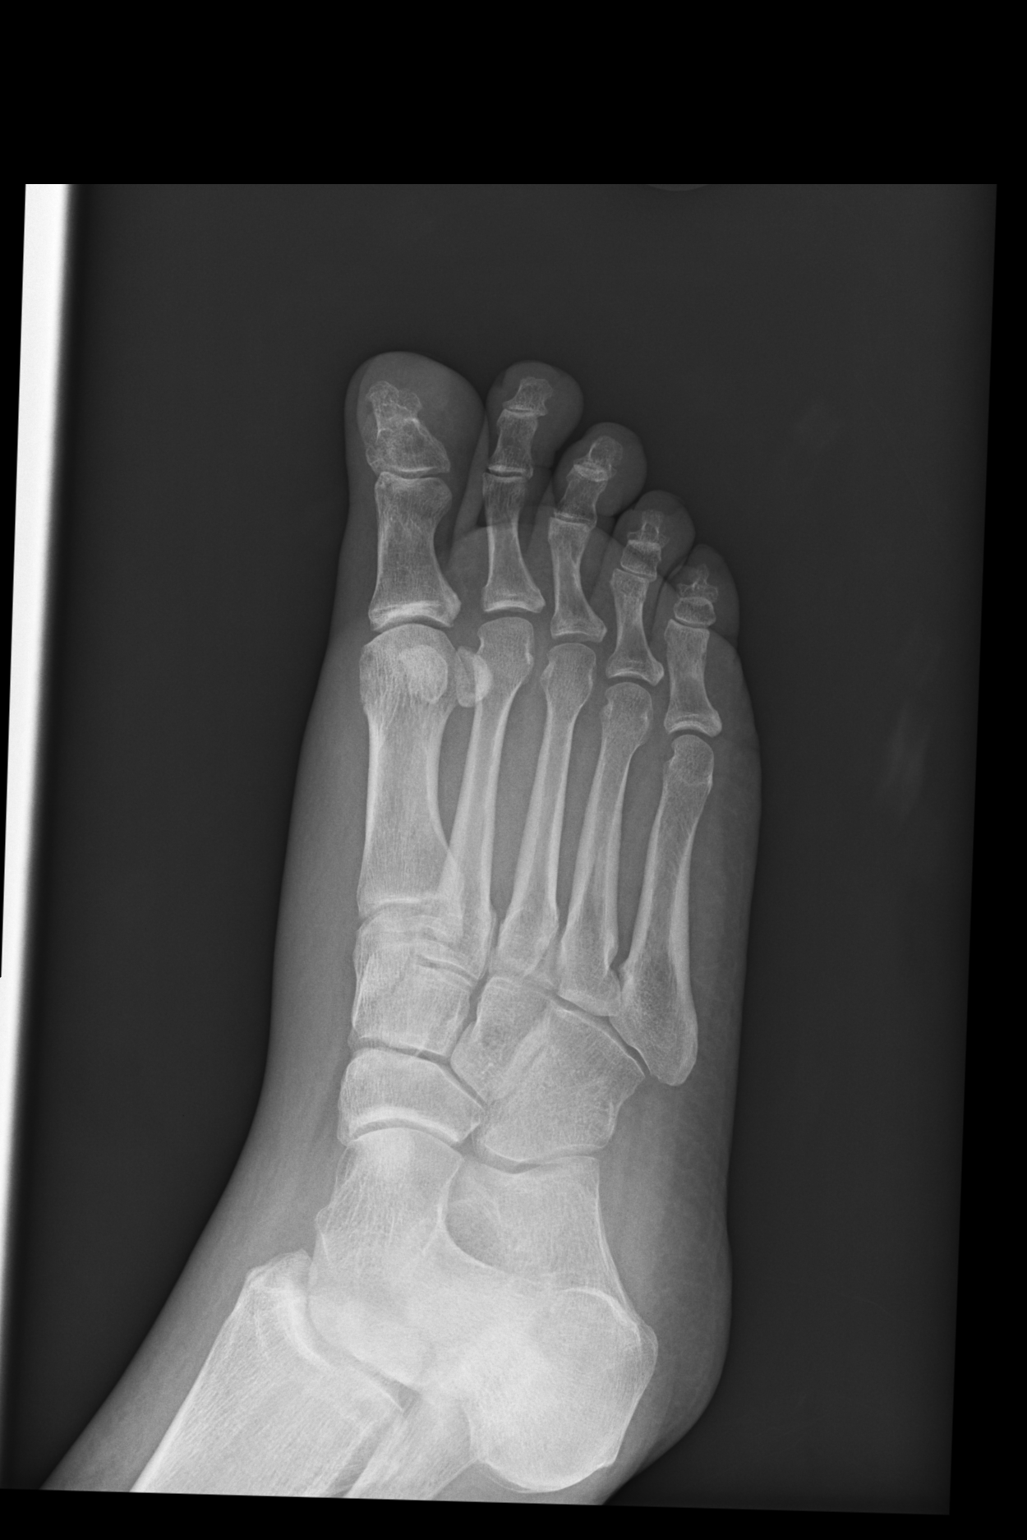

[3 of 3 positions shown; findings below may reference images not displayed]

FINDINGS: Soft tissue swelling noted at the midfoot. No underlying fracture or
dislocation evident. No suspicious lytic or sclerotic osseous
abnormality. No evidence for radiopaque soft tissue foreign body.
IMPRESSION: Soft tissue swelling without acute bony abnormality.

## 2019-11-27 DIAGNOSIS — M545 Low back pain: Secondary | ICD-10-CM | POA: Diagnosis not present

## 2019-11-27 DIAGNOSIS — Z79899 Other long term (current) drug therapy: Secondary | ICD-10-CM | POA: Diagnosis not present

## 2019-11-27 DIAGNOSIS — Z9189 Other specified personal risk factors, not elsewhere classified: Secondary | ICD-10-CM | POA: Diagnosis not present

## 2019-11-27 DIAGNOSIS — Z79891 Long term (current) use of opiate analgesic: Secondary | ICD-10-CM | POA: Diagnosis not present

## 2019-12-05 IMAGING — CT CT ABD-PELV W/ CM
3 of 10 series · 12 of 46 positions shown, 17 images · IV contrast (Isovue)
Comparison: CTA chest 06/19/2018

CT abdomen pelvis 08/12/2018

CLINICAL DATA: Right-sided chest pain with shortness of breath.
Lower extremity edema. Abdominal pain.

EXAM:
CT ANGIOGRAPHY CHEST
CT ABDOMEN AND PELVIS WITH CONTRAST
TECHNIQUE: Multidetector CT imaging of the chest was performed using the
standard protocol during bolus administration of intravenous
contrast. Multiplanar CT image reconstructions and MIPs were
obtained to evaluate the vascular anatomy. Multidetector CT imaging
of the abdomen and pelvis was performed using the standard protocol
during bolus administration of intravenous contrast.
CONTRAST:  100mL OMNIPAQUE IOHEXOL 350 MG/ML SOLN

[Series 5: thins · axial · 0.71mm/px · z∈[+1177,+1439]mm · 8 of 319 slices shown]
[im 38/319  soft-tissue]
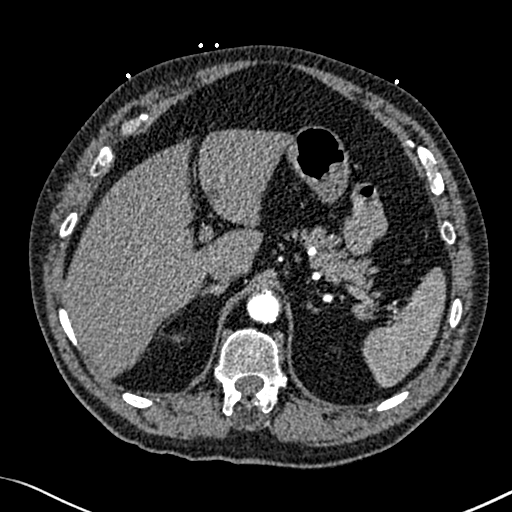
[im 75/319  soft-tissue]
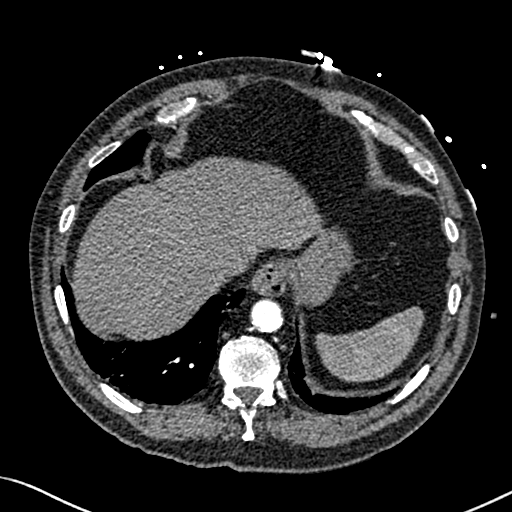
[im 113/319  soft-tissue]
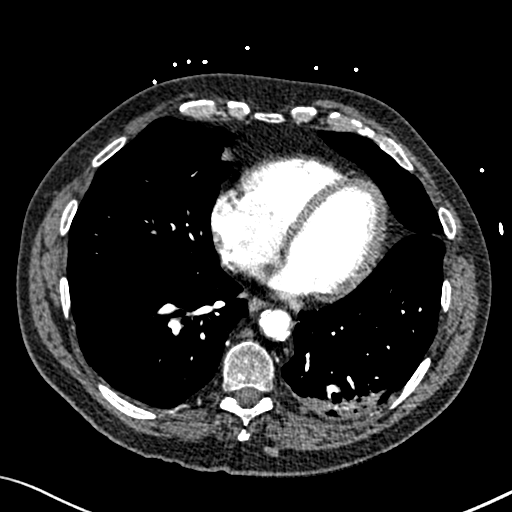
[im 150/319  soft-tissue]
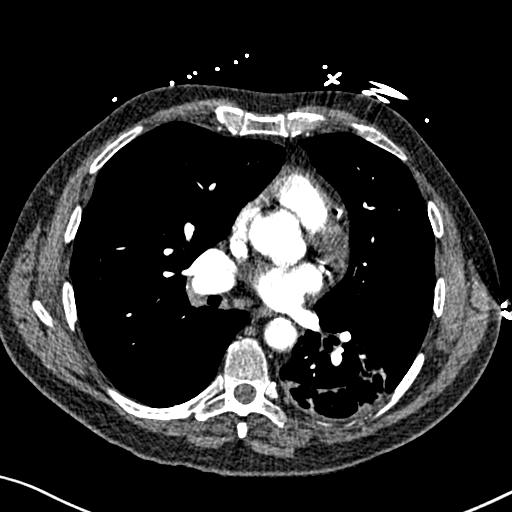
[im 188/319  soft-tissue]
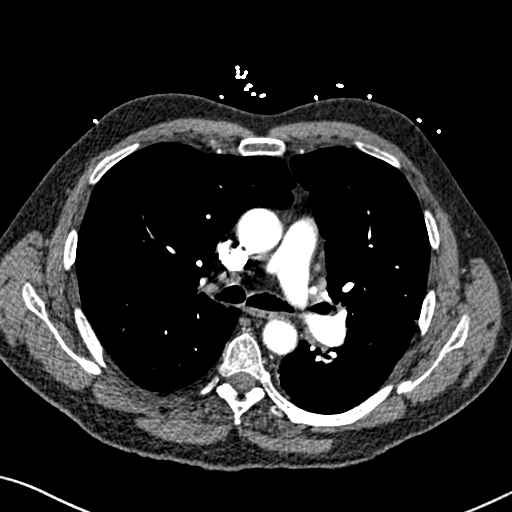
[im 225/319  soft-tissue]
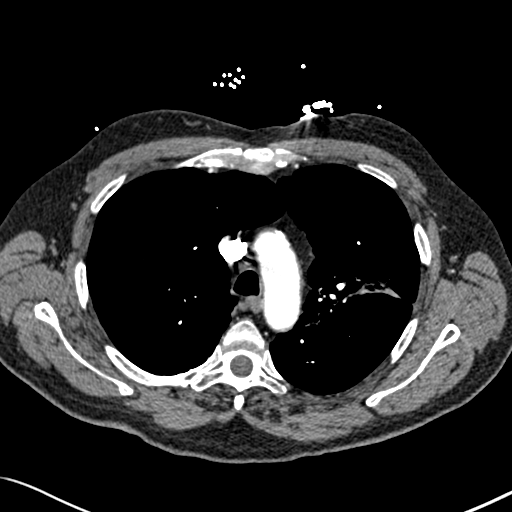
[im 262/319  soft-tissue]
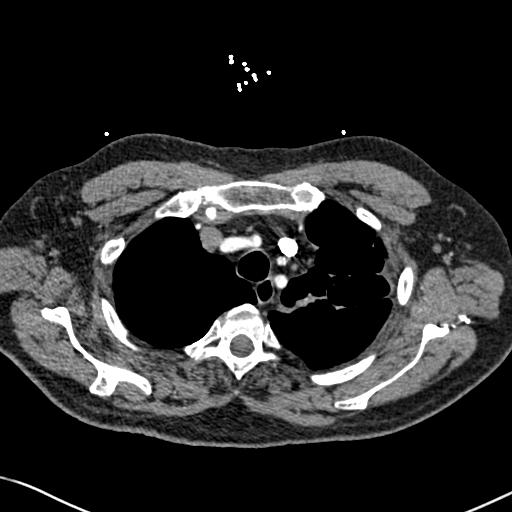
[im 300/319  soft-tissue]
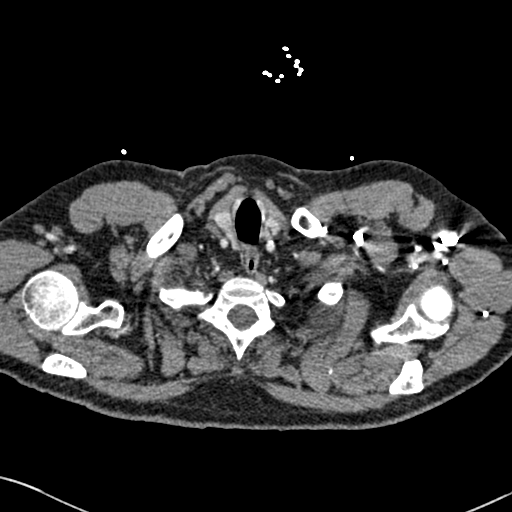

[Series 7: coronal mpr · coronal · 0.62mm/px · 1 of 148 slices shown, 2 images]
[im 74/148  soft-tissue]
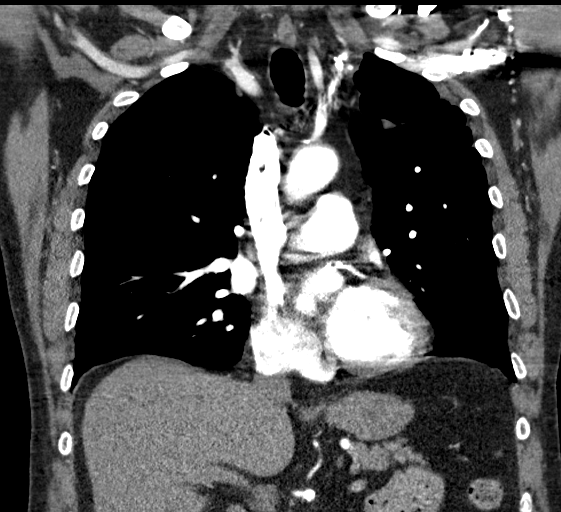
[im 74/148  bone]
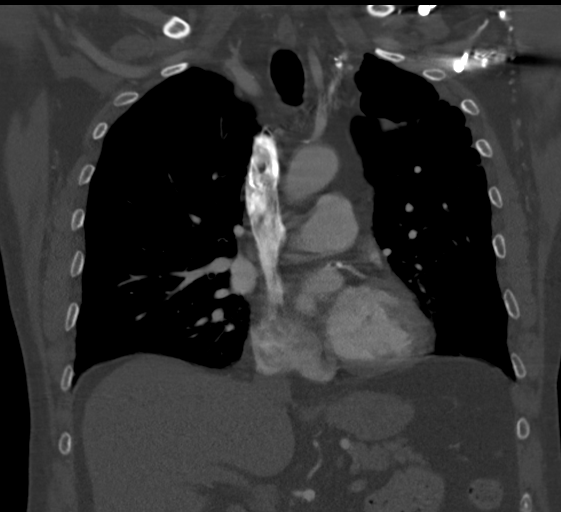

[Series 12: axial st · axial · 0.74mm/px · z∈[+922,+1157]mm · 3 of 95 slices shown, 7 images]
[im 24/95  soft-tissue]
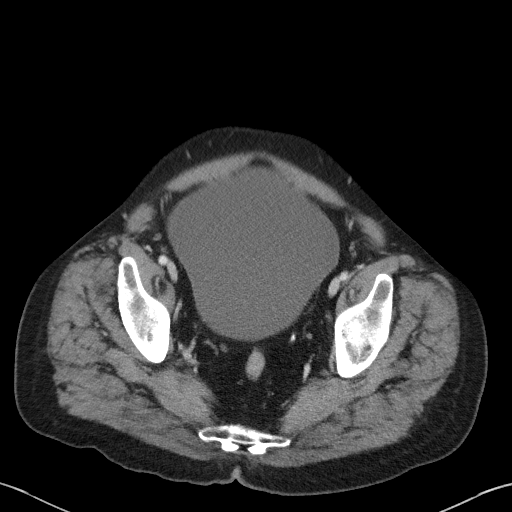
[im 24/95  lung]
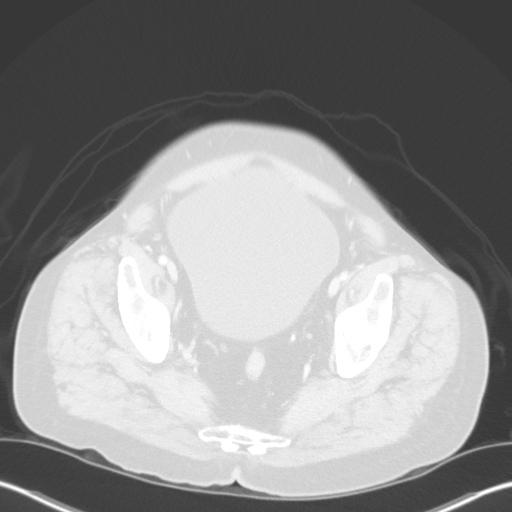
[im 24/95  bone]
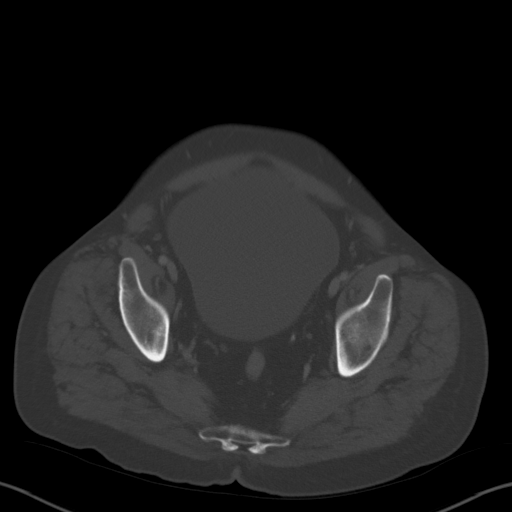
[im 48/95  soft-tissue]
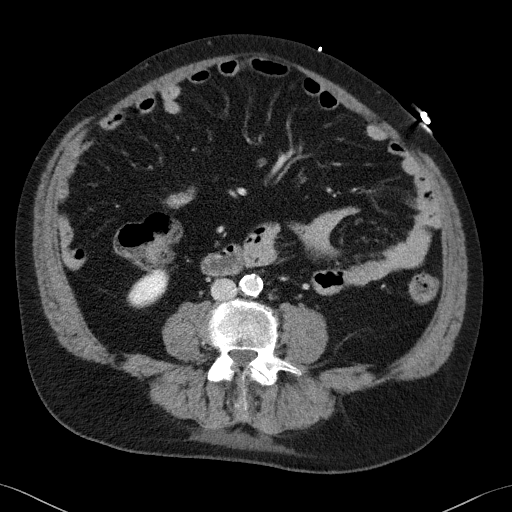
[im 48/95  lung]
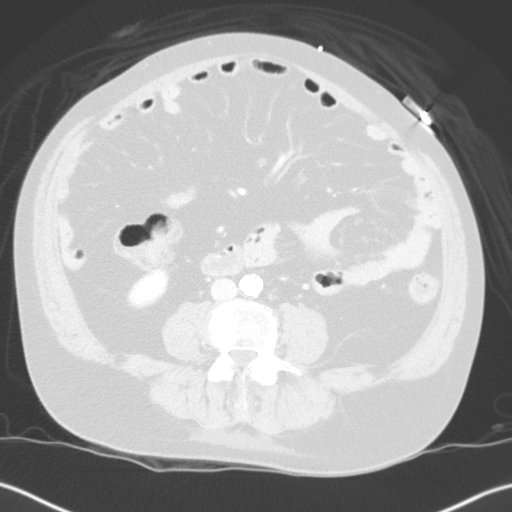
[im 71/95  soft-tissue]
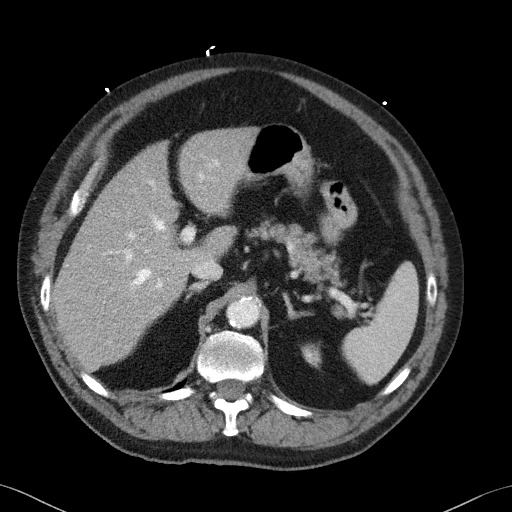
[im 71/95  lung]
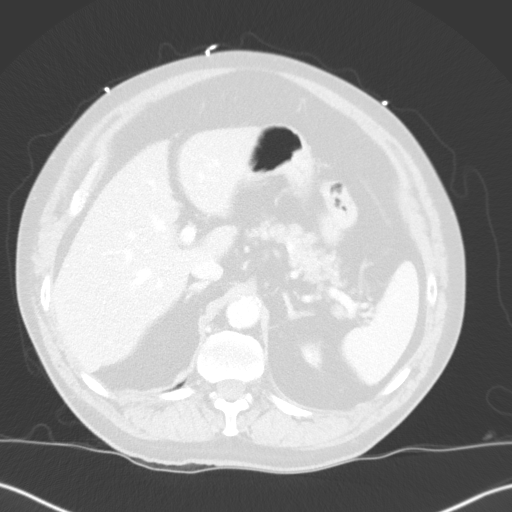

[12 of 46 positions shown; findings below may reference images not displayed]

FINDINGS: CTA CHEST FINDINGS

Cardiovascular: Contrast injection is sufficient to demonstrate
satisfactory opacification of the pulmonary arteries to the
segmental level. There is no pulmonary embolus. The main pulmonary
artery is within normal limits for size. There is no CT evidence of
acute right heart strain. There is moderate calcific aortic
atherosclerosis. No aortic aneurysm or dissection. Proximal arch
vessels are widely patent. There is a normal 3-vessel arch branching
pattern. Heart size is normal, without pericardial effusion.

Mediastinum/Nodes: No mediastinal, hilar or axillary
lymphadenopathy. The visualized thyroid and thoracic esophageal
course are unremarkable.

Lungs/Pleura: There is bullous emphysema of both lungs. Chronic
cavitation within the left upper and lower lobes is unchanged. No
masses or pulmonary nodules. No pleural effusion or pneumothorax.
Bibasilar atelectasis.

Musculoskeletal: No chest wall abnormality. No acute or significant
osseous findings.

Review of the MIP images confirms the above findings.

CT ABDOMEN and PELVIS FINDINGS

Hepatobiliary: Normal hepatic contours and density. No visible
biliary dilatation. Normal gallbladder.

Pancreas: Normal contours without ductal dilatation. No
peripancreatic fluid collection.

Spleen: Normal.

Adrenals/Urinary Tract:

--Adrenal glands: Normal.

--Right kidney/ureter: No hydronephrosis or perinephric stranding.
No nephrolithiasis. No obstructing ureteral stones.

--Left kidney/ureter: No hydronephrosis or perinephric stranding. No
nephrolithiasis. No obstructing ureteral stones.

--Urinary bladder: Markedly distended.

Stomach/Bowel:

--Stomach/Duodenum: No hiatal hernia or other gastric abnormality.
Normal duodenal course and caliber.

--Small bowel: No dilatation or inflammation.

--Colon: No focal abnormality.

--Appendix: Normal.

Vascular/Lymphatic: Atherosclerotic calcification is present within
the non-aneurysmal abdominal aorta, without hemodynamically
significant stenosis. No abdominal or pelvic lymphadenopathy.

Reproductive: Normal prostate and seminal vesicles.

Musculoskeletal. No bony spinal canal stenosis or focal osseous
abnormality.

Other: None.
IMPRESSION: 1. No pulmonary embolus or acute aortic syndrome.
2. Severe bullous emphysema with unchanged areas of cavitation in
the left upper and lower lobes.
3. No acute abnormality of the abdomen or pelvis.
4. Markedly distended urinary bladder. Correlate for signs of
urinary retention.
5. Aortic Atherosclerosis (LP71T-TB3.3) and Emphysema (LP71T-Y9Q.H).

## 2019-12-07 ENCOUNTER — Telehealth (INDEPENDENT_AMBULATORY_CARE_PROVIDER_SITE_OTHER): Payer: Medicare Other | Admitting: Internal Medicine

## 2019-12-07 ENCOUNTER — Telehealth: Payer: Self-pay | Admitting: Pulmonary Disease

## 2019-12-07 ENCOUNTER — Telehealth: Payer: Self-pay | Admitting: *Deleted

## 2019-12-07 ENCOUNTER — Other Ambulatory Visit: Payer: Self-pay

## 2019-12-07 DIAGNOSIS — A310 Pulmonary mycobacterial infection: Secondary | ICD-10-CM

## 2019-12-07 NOTE — Telephone Encounter (Signed)
Spoke with patient's wife regarding prior message. Patient is new to our practice and was asking for a refill on prednisone 10 mg stage 4 COPD . Advised patient's wife that they needed to call his PCP to get a refill .Patient wife stated she contacted [facility] several time and had the pharmacy send over refill request to [facility]. I advised patient's wife I would call and try to help patient get his refill . Called and spoke to [facility] NP nurse and advised her patient is going to need a refill on his prednisone 10mg  can she ask Tyler,TerryNP for refill for patient. Advised patient's wife I spoke with Tyler,Terry NP nurse and she sent a message to his pcp for a refill and would contact him today. Advised patient' wife I will check up tomorrow on this . Her voice was understanding.

## 2019-12-07 NOTE — Progress Notes (Signed)
No show

## 2019-12-07 NOTE — Telephone Encounter (Signed)
Patient's wife returned missed call at 80:22 for patient's evisit with Dr Linus Salmons scheduled at 11:30 this morning.  Message not retrieved from triage voicemail until after the visit window. Rescheduled for 6/23 1:45.  They are having difficulty getting refills on the prednisone 10mg  daily from primary care.  They will contact Pulmonology as well, as they did not set up hospital follow up after discharge. Landis Gandy, RN

## 2019-12-09 NOTE — Telephone Encounter (Signed)
Spoke with patient's wife(Sherry) regarding f/u on his medication. [name] stated that she has to go to [facility] to speak to the office manager regarding several attempts from patient's pharmacy and several phone call's for refill. Sheyy spoke with the officemanager and was told patient's PCP was going to send the script in to his pharmacy. [name] thanked me for trying to help. Advised sherry we will see patient in our office on 01/24/20. Sherry's voice was understanding. Nothing else further needed.

## 2019-12-09 NOTE — Telephone Encounter (Signed)
He should contact pulm for steroids and his PCP for other things.  I had logged on to the video visit at 11:30.   thanks

## 2019-12-23 DIAGNOSIS — J449 Chronic obstructive pulmonary disease, unspecified: Secondary | ICD-10-CM | POA: Diagnosis not present

## 2019-12-29 DIAGNOSIS — M545 Low back pain: Secondary | ICD-10-CM | POA: Diagnosis not present

## 2019-12-29 DIAGNOSIS — J9611 Chronic respiratory failure with hypoxia: Secondary | ICD-10-CM | POA: Diagnosis not present

## 2019-12-29 DIAGNOSIS — Z515 Encounter for palliative care: Secondary | ICD-10-CM | POA: Diagnosis not present

## 2020-01-04 ENCOUNTER — Telehealth (INDEPENDENT_AMBULATORY_CARE_PROVIDER_SITE_OTHER): Payer: Medicare Other | Admitting: Internal Medicine

## 2020-01-04 ENCOUNTER — Other Ambulatory Visit: Payer: Self-pay

## 2020-01-04 DIAGNOSIS — A310 Pulmonary mycobacterial infection: Secondary | ICD-10-CM

## 2020-01-04 NOTE — Progress Notes (Signed)
No show

## 2020-01-22 DIAGNOSIS — J449 Chronic obstructive pulmonary disease, unspecified: Secondary | ICD-10-CM | POA: Diagnosis not present

## 2020-01-24 ENCOUNTER — Institutional Professional Consult (permissible substitution): Payer: Medicare Other | Admitting: Pulmonary Disease

## 2020-01-27 DIAGNOSIS — Z79891 Long term (current) use of opiate analgesic: Secondary | ICD-10-CM | POA: Diagnosis not present

## 2020-01-27 DIAGNOSIS — M545 Low back pain: Secondary | ICD-10-CM | POA: Diagnosis not present

## 2020-01-27 DIAGNOSIS — Z515 Encounter for palliative care: Secondary | ICD-10-CM | POA: Diagnosis not present

## 2020-01-27 DIAGNOSIS — Z79899 Other long term (current) drug therapy: Secondary | ICD-10-CM | POA: Diagnosis not present

## 2020-02-17 ENCOUNTER — Emergency Department (HOSPITAL_COMMUNITY): Payer: Medicare Other

## 2020-02-17 ENCOUNTER — Emergency Department (HOSPITAL_COMMUNITY)
Admission: EM | Admit: 2020-02-17 | Discharge: 2020-02-17 | Disposition: A | Discharge status: Home / Self Care | Payer: Medicare Other | Attending: Emergency Medicine | Admitting: Emergency Medicine

## 2020-02-17 ENCOUNTER — Other Ambulatory Visit: Payer: Self-pay

## 2020-02-17 ENCOUNTER — Encounter (HOSPITAL_COMMUNITY): Payer: Self-pay

## 2020-02-17 DIAGNOSIS — I700 Atherosclerosis of aorta: Secondary | ICD-10-CM | POA: Diagnosis not present

## 2020-02-17 DIAGNOSIS — R0789 Other chest pain: Secondary | ICD-10-CM | POA: Diagnosis not present

## 2020-02-17 DIAGNOSIS — I5033 Acute on chronic diastolic (congestive) heart failure: Secondary | ICD-10-CM | POA: Insufficient documentation

## 2020-02-17 DIAGNOSIS — J9611 Chronic respiratory failure with hypoxia: Secondary | ICD-10-CM | POA: Insufficient documentation

## 2020-02-17 DIAGNOSIS — E119 Type 2 diabetes mellitus without complications: Secondary | ICD-10-CM | POA: Insufficient documentation

## 2020-02-17 DIAGNOSIS — J441 Chronic obstructive pulmonary disease with (acute) exacerbation: Secondary | ICD-10-CM | POA: Insufficient documentation

## 2020-02-17 DIAGNOSIS — Z79899 Other long term (current) drug therapy: Secondary | ICD-10-CM | POA: Diagnosis not present

## 2020-02-17 DIAGNOSIS — Z87891 Personal history of nicotine dependence: Secondary | ICD-10-CM | POA: Insufficient documentation

## 2020-02-17 DIAGNOSIS — R05 Cough: Secondary | ICD-10-CM | POA: Diagnosis not present

## 2020-02-17 DIAGNOSIS — R042 Hemoptysis: Secondary | ICD-10-CM | POA: Diagnosis not present

## 2020-02-17 DIAGNOSIS — Z20822 Contact with and (suspected) exposure to covid-19: Secondary | ICD-10-CM | POA: Diagnosis not present

## 2020-02-17 DIAGNOSIS — R079 Chest pain, unspecified: Secondary | ICD-10-CM | POA: Diagnosis not present

## 2020-02-17 DIAGNOSIS — Z743 Need for continuous supervision: Secondary | ICD-10-CM | POA: Diagnosis not present

## 2020-02-17 DIAGNOSIS — I2510 Atherosclerotic heart disease of native coronary artery without angina pectoris: Secondary | ICD-10-CM | POA: Diagnosis not present

## 2020-02-17 DIAGNOSIS — K449 Diaphragmatic hernia without obstruction or gangrene: Secondary | ICD-10-CM | POA: Diagnosis not present

## 2020-02-17 DIAGNOSIS — R58 Hemorrhage, not elsewhere classified: Secondary | ICD-10-CM | POA: Diagnosis not present

## 2020-02-17 DIAGNOSIS — J439 Emphysema, unspecified: Secondary | ICD-10-CM | POA: Diagnosis not present

## 2020-02-17 LAB — COMPREHENSIVE METABOLIC PANEL
ALT: 18 U/L (ref 0–44)
AST: 12 U/L — ABNORMAL LOW (ref 15–41)
Albumin: 3 g/dL — ABNORMAL LOW (ref 3.5–5.0)
Alkaline Phosphatase: 50 U/L (ref 38–126)
Anion gap: 10 (ref 5–15)
BUN: 14 mg/dL (ref 8–23)
CO2: 28 mmol/L (ref 22–32)
Calcium: 8.4 mg/dL — ABNORMAL LOW (ref 8.9–10.3)
Chloride: 97 mmol/L — ABNORMAL LOW (ref 98–111)
Creatinine, Ser: 0.89 mg/dL (ref 0.61–1.24)
GFR calc Af Amer: >60 mL/min (ref >60)
GFR calc non Af Amer: >60 mL/min (ref >60)
Glucose, Bld: 250 mg/dL — ABNORMAL HIGH (ref 70–99)
Potassium: 3.8 mmol/L (ref 3.5–5.1)
Sodium: 135 mmol/L (ref 135–145)
Total Bilirubin: 0.2 mg/dL — ABNORMAL LOW (ref 0.3–1.2)
Total Protein: 6.9 g/dL (ref 6.5–8.1)

## 2020-02-17 LAB — CBC WITH DIFFERENTIAL/PLATELET
Abs Immature Granulocytes: 0.05 10*3/uL (ref 0.00–0.07)
Basophils Absolute: 0 10*3/uL (ref 0.0–0.1)
Basophils Relative: 1 %
Eosinophils Absolute: 0.3 10*3/uL (ref 0.0–0.5)
Eosinophils Relative: 5 %
HCT: 32.8 % — ABNORMAL LOW (ref 39.0–52.0)
Hemoglobin: 9.9 g/dL — ABNORMAL LOW (ref 13.0–17.0)
Immature Granulocytes: 1 %
Lymphocytes Relative: 23 %
Lymphs Abs: 1.7 10*3/uL (ref 0.7–4.0)
MCH: 23.4 pg — ABNORMAL LOW (ref 26.0–34.0)
MCHC: 30.2 g/dL (ref 30.0–36.0)
MCV: 77.5 fL — ABNORMAL LOW (ref 80.0–100.0)
Monocytes Absolute: 1 10*3/uL (ref 0.1–1.0)
Monocytes Relative: 14 %
Neutro Abs: 4.1 10*3/uL (ref 1.7–7.7)
Neutrophils Relative %: 56 %
Platelets: 253 10*3/uL (ref 150–400)
RBC: 4.23 MIL/uL (ref 4.22–5.81)
RDW: 18.4 % — ABNORMAL HIGH (ref 11.5–15.5)
WBC: 7.2 10*3/uL (ref 4.0–10.5)
nRBC: 0 % (ref 0.0–0.2)

## 2020-02-17 LAB — TROPONIN I (HIGH SENSITIVITY)
Troponin I (High Sensitivity): 3 ng/L (ref <18)
Troponin I (High Sensitivity): 3 ng/L (ref <18)

## 2020-02-17 LAB — PROTIME-INR
INR: 0.9 (ref 0.8–1.2)
Prothrombin Time: 12.1 seconds (ref 11.4–15.2)

## 2020-02-17 LAB — SARS CORONAVIRUS 2 BY RT PCR (HOSPITAL ORDER, PERFORMED IN ~~LOC~~ HOSPITAL LAB): SARS Coronavirus 2: NEGATIVE

## 2020-02-17 MED ORDER — IOHEXOL 350 MG/ML SOLN
100.0000 mL | Freq: Once | INTRAVENOUS | Status: AC | PRN
  Administered 2020-02-17: 100 mL via INTRAVENOUS

## 2020-02-17 NOTE — ED Provider Notes (Signed)
Emergency Department Provider Note   I have reviewed the triage vital signs and the nursing notes.   HISTORY  Chief Complaint Chest Pain and Hemoptysis   HPI Ryan [name] is a 59 y.o. male with PMH reviewed below including h/o of stage 4 COPD on 6L [place] O2 and MAI with cavitary lung lesion currently on abx presents to the ED with hemoptysis over the last 2 days.  Patient notes that he has baseline pink tinge to his sputum but that over the last 2 days he has had episodes of more bright red blood with forceful coughing.  He is not experiencing any chest pain or shortness of breath worse than his baseline.  He has been compliant with his home medications including 20 mg of prednisone daily and antibiotics.  He is followed by his PCP, pulmonology, infectious disease for his MAI and severe COPD.  He is not experiencing vomiting blood or black/bloody diarrhea.  No abdominal or back pain.   Past Medical History:  Diagnosis Date  . Atrial fibrillation ([place])    Not anticoagulated  . Barrett's esophagus   . Borderline diabetes   . Bronchitis   . CAP (community acquired pneumonia) 07/10/2013   04/2015  . 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 Chronic back pain   . Chronic diastolic CHF (congestive heart failure) ([place])   . Chronic left shoulder pain   . Chronic neck pain   . Chronic respiratory failure ([place])   . Collagen vascular disease ([place])   . COPD (chronic obstructive pulmonary disease) ([place])   . Coronary atherosclerosis of native coronary artery    Mild nonobstructive disease at catheterization 2007  . DDD (degenerative disc disease)    Cervical and thoracic  . Diastolic heart failure ([place])   . GERD (gastroesophageal reflux disease)   . History of pneumonia   . Hypercholesteremia   . On home O2    6L [place]  . Polycythemia   . Seizures ([place])    Last seizure 2 yrs ago  . Smoker   . Type 2 diabetes mellitus ([place])   . Type 2 diabetes  mellitus without complication [facility])     Patient Active Problem List   Diagnosis Date Noted  . 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 Acute on chronic respiratory failure ([place]) 08/08/2019  . Acute respiratory failure with hypoxia and hypercapnia ([place]) 08/08/2019  . Goals of care, counseling/discussion   . Palliative care by specialist   . DNR (do not resuscitate) discussion   . Dyspnea 05/04/2019  . Acute exacerbation of CHF (congestive heart failure) ([place]) 05/04/2019  . Urinary retention 09/17/2018  . Chronic pain 09/17/2018  . Anxiety 09/17/2018  . Influenza due to identified novel influenza A virus with other respiratory manifestations 09/05/2018  . HCAP (healthcare-associated pneumonia) 08/26/2018  . GERD (gastroesophageal reflux disease) 08/04/2018  . Chest pain 06/20/2018  . Left-sided chest pain 06/19/2018  . Dysphagia 04/26/2018  . Acute on chronic respiratory failure with hypoxia ([place]) 04/20/2018  . Vision disorder 12/30/2017  . Umbilical hernia 49/44/9675  . Chronic diastolic CHF (congestive heart failure) ([place]) 10/24/2017  . Lower extremity edema 10/17/2017  . MAI (mycobacterium avium-intracellulare) ([place]) 08/29/2015  . Anemia of chronic disease   . Elevated liver enzymes   . Absolute anemia   . Pulmonary fibrosis ([place]) 05/10/2015  . Chronic respiratory failure with hypoxia ([place]) 05/10/2015  . Pulmonary nodule   . Pain, generalized  05/01/2015  . Laceration 05/01/2015  . PAF (paroxysmal atrial fibrillation) ([place])   . COPD exacerbation ([place])   . Protein-calorie malnutrition, severe ([place]) 10/21/2013  . Hemoptysis 10/20/2013  . Atrial fibrillation with rapid ventricular response ([place]) 07/10/2013  . Rotator cuff syndrome of left shoulder 01/20/2012  . Spondylosis, cervical 11/19/2011  . 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 Cavitary lesion of lung 05/07/2011  . GERD with stricture   . ASCVD (arteriosclerotic cardiovascular  disease)   . Type 2 diabetes mellitus without complication ([place])   . TOBACCO ABUSE 05/17/2010  . Asthma 01/20/2008  . Hyperlipidemia 12/27/2007  . 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 COLONOSCOPY  2012   Dr. Posey Pronto: normal  . ESOPHAGOGASTRODUODENOSCOPY N/A 05/04/2015   Dr. Michail Sermon: minimal erosive esophagitis, small hiatal hernia. FOOD PRECLUDED A COMPLETE EXAM. No biopsies taken  . ESOPHAGOGASTRODUODENOSCOPY  2011   [place]: Barrett's   . ESOPHAGOGASTRODUODENOSCOPY (EGD) WITH PROPOFOL N/A 05/06/2018   Dr. Gala Romney: erosive reflux esophagitis and +candida, Schatzki's ring s/p dilation, hiatal hernia.  [name] LUNG BIOPSY    . MALONEY DILATION N/A 05/06/2018   Procedure: Venia Minks DILATION;  Surgeon: Daneil Dolin, MD;  Location: [facility];  Service: Endoscopy;  Laterality: N/A;  . Throat biopsy    . VASECTOMY    . VASECTOMY  1987    Allergies Albuterol, Ciprofloxacin, and Influenza vaccine live  Family History  Problem Relation Age of Onset  . Hypertension Mother   . Diabetes Mother   . Heart attack Mother   . Heart failure Mother   . Hypertension Sister   . Diabetes Sister   . Heart failure Sister   . Colon cancer Neg Hx     Social History Social History   Tobacco Use  . Smoking status: Former Smoker    Packs/day: 0.50    Years: 45.00    Pack years: 22.50    Types: Cigarettes    Quit date: 05/01/2015    Years since quitting: 4.8  . Smokeless tobacco: Never Used  Vaping Use  . Vaping Use: Never used  Substance Use Topics  . Alcohol use: No    Alcohol/week: 0.0 standard drinks  . Drug use: No    Review of Systems  Constitutional: No fever/chills Eyes: No visual changes. ENT: No sore throat. Cardiovascular: Denies chest pain. Respiratory: Denies new shortness of breath. Positive hemoptysis.  Gastrointestinal: No abdominal pain.  Genitourinary: Negative for  dysuria. Musculoskeletal: Negative for back pain. Skin: Negative for rash. Neurological: Negative for headaches.  10-point ROS otherwise negative.  ____________________________________________   PHYSICAL EXAM:  VITAL SIGNS: ED Triage Vitals  Enc Vitals Group     BP 02/17/20 1113 115/79     Pulse Rate 02/17/20 1113 (!) 107     Resp 02/17/20 1113 (!) 22     Temp 02/17/20 1113 98.4 F (36.9 C)     Temp Source 02/17/20 1113 Oral     SpO2 02/17/20 1113 96 %     Weight 02/17/20 1114 175 lb (79.4 kg)     Height 02/17/20 1114 5\' 8"  (1.727 m)   Constitutional: Alert and oriented. Well appearing and in no acute distress. Eyes: Conjunctivae are normal. Head: Atraumatic. Nose: No congestion/rhinnorhea. Mouth/Throat: Mucous membranes are moist.   Neck: No stridor.  Cardiovascular: Normal rate, regular rhythm. Good peripheral circulation. Grossly normal heart sounds.   Respiratory: Normal respiratory  effort.  No retractions. Lungs with rhonchi at the bases bilaterally with very faint end-expiratory wheezing. No rales.  Gastrointestinal: No tenderness to diffuse palpation. No distention.  Musculoskeletal: No gross deformities of extremities. Neurologic:  Normal speech and language.  Skin:  Skin is warm, dry and intact. No rash noted.  ____________________________________________   LABS (all labs ordered are listed, but only abnormal results are displayed)  Labs Reviewed  COMPREHENSIVE METABOLIC PANEL - Abnormal; Notable for the following components:      Result Value   Chloride 97 (*)    Glucose, Bld 250 (*)    Calcium 8.4 (*)    Albumin 3.0 (*)    AST 12 (*)    Total Bilirubin 0.2 (*)    All other components within normal limits  CBC WITH DIFFERENTIAL/PLATELET - Abnormal; Notable for the following components:   Hemoglobin 9.9 (*)    HCT 32.8 (*)    MCV 77.5 (*)    MCH 23.4 (*)    RDW 18.4 (*)    All other components within normal limits  SARS CORONAVIRUS 2 BY RT PCR  (HOSPITAL ORDER, [facility])  PROTIME-INR  TROPONIN I (HIGH SENSITIVITY)  TROPONIN I (HIGH SENSITIVITY)   ____________________________________________  EKG   EKG Interpretation  Date/Time:  Friday February 17 2020 11:09:11 EDT Ventricular Rate:  105 PR Interval:    QRS Duration: 83 QT Interval:  340 QTC Calculation: 450 R Axis:   55 Text Interpretation: Sinus tachycardia No STEMI Confirmed by Nanda Quinton [phone]) on 02/17/2020 12:28:05 PM       ____________________________________________  RADIOLOGY  CT Angio Chest PE W and/or Wo Contrast  Result Date: 02/17/2020 CLINICAL DATA:  Chest pain, hemoptysis EXAM: CT ANGIOGRAPHY CHEST WITH CONTRAST TECHNIQUE: Multidetector CT imaging of the chest was performed using the standard protocol during bolus administration of intravenous contrast. Multiplanar CT image reconstructions and MIPs were obtained to evaluate the vascular anatomy. CONTRAST:  123mL OMNIPAQUE IOHEXOL 350 MG/ML SOLN COMPARISON:  CT 08/08/2019 FINDINGS: Cardiovascular: Satisfactory opacification of the pulmonary arteries to the segmental level. Respiratory motion mildly degrades evaluation within the lung bases. No evidence of pulmonary embolism. Normal heart size. No pericardial effusion. Thoracic aorta is nonaneurysmal. Atherosclerotic calcifications of the aorta and coronary arteries. Mediastinum/Nodes: Mildly enlarged bilateral hilar lymph nodes measuring 10 mm on the left and 9 mm on the right (series 4, images 45 and 54). No axillary or mediastinal lymphadenopathy. Thyroid gland within normal limits. Small sliding-type hiatal hernia. Esophagus otherwise unremarkable. Trachea unremarkable. Lungs/Pleura: Severe bullous emphysema redemonstration of thick walled cavity with internal debris in the posterior left hemithorax now measuring approximately 6.1 x 3.0 cm trans axially (series 6, image 49), previously measured approximately 8.5 x 5.3 cm. Cranial  caudal extent measuring approximately 17.8 cm (series 8, image 128), previously approximately 20 cm. Stable 0.8 cm nodule within the lingula (series 6, image 91), benign. Trace left pleural fluid. Right lung is clear. No pneumothorax. Upper Abdomen: No acute findings within the visualized portion of the upper abdomen. Musculoskeletal: No chest wall abnormality. No acute or significant osseous findings. Review of the MIP images confirms the above findings. IMPRESSION: 1. No evidence of acute pulmonary embolism. 2. Slight interval decrease in size of a thick-walled cavity with internal debris in the posterior left hemithorax. Findings favor a chronic infectious/inflammatory etiology such as a mycetoma. 3. Severe bullous emphysema (ICD10-J43.9). 4. Mildly enlarged bilateral hilar lymph nodes, likely reactive. 5. Small hiatal hernia. 6. Aortic and  coronary artery atherosclerosis (ICD10-I70.0). Electronically Signed   By: Davina Poke D.O.   On: 02/17/2020 13:50   DG Chest Portable 1 View  Result Date: 02/17/2020 CLINICAL DATA:  Chest pain and hemoptysis EXAM: PORTABLE CHEST 1 VIEW COMPARISON:  August 08, 2019 chest radiograph and chest CT FINDINGS: There is volume loss on the left with fibrosis and soft tissue thickening throughout the left upper lobe involving the left apex and apicolateral regions. There has been progression of volume loss in this area compared to the previous study. Elsewhere there is underlying emphysematous change with bullous disease in the upper lobes. There is interstitial thickening in the lung base regions which is likely in part due to fibrosis and in part due to redistribution of blood flow to viable lung segments. There is atelectatic change in the left base. Heart size is normal. There is distortion of pulmonary vascularity on the left. Diminished vascularity in the upper lobes is indicative of the bullous disease. No adenopathy is appreciable by radiography. No bone lesions.  IMPRESSION: Extensive underlying emphysematous change with areas of scarring. Increase in fibrosis in the left upper lobe with progressive volume loss and pleural thickening. Cavitation in this area is suspected, better seen on prior CT. Given progression of volume loss in the left upper lobe and hemoptysis, correlation with chest CT at this time to directly compare with prior CT is felt to be advisable. Heart size normal. No adenopathy evident by radiography. Note that there is now mild shift of the mediastinum toward the left upper lobe given the volume loss in this area. Electronically Signed   By: Lowella Grip III M.D.   On: 02/17/2020 11:47    ____________________________________________   PROCEDURES  Procedure(s) performed:   Procedures  None  ____________________________________________   INITIAL IMPRESSION / ASSESSMENT AND PLAN / ED COURSE  Pertinent labs & imaging results that were available during my care of the patient were reviewed by me and considered in my medical decision making (see chart for details).   Patient presents to the emergency department for evaluation of acute on chronic hemoptysis over the past 2 days.  He has active MAI with cavitary lesion and COPD.  He is on his baseline home oxygen here in the emergency department and is in no acute respiratory distress.  Normal mental status. CXR with findings reviewed as above.  Plan for CTA of the chest but overall suspicion for PE is low.  Will better evaluate abnormalities on chest x-ray with CT as well.  Labs showed no severe anemia or need for blood transfusion.  Patient does not require intubation or other respiratory treatment at this time.   No large volume hemoptysis here in the ED. CT reviewed and discussed with patient. Labs reviewed. Plan for continued abx at home and close f/u with pulmonology and ID. Patient to call this afternoon to schedule f/u appts.  ____________________________________________  FINAL  CLINICAL IMPRESSION(S) / ED DIAGNOSES  Final diagnoses:  Cough with hemoptysis    MEDICATIONS GIVEN DURING THIS VISIT:  Medications  iohexol (OMNIPAQUE) 350 MG/ML injection 100 mL (100 mLs Intravenous Contrast Given 02/17/20 1319)    Note:  This document was prepared using Dragon voice recognition software and may include unintentional dictation errors.  Nanda Quinton, MD, [facility] Emergency Medicine    Darthula Desa, [facility], MD 02/18/20 [phone]

## 2020-02-17 NOTE — ED Triage Notes (Signed)
Pt to er room number 8 via ems per ems pt is here for chest pain and also coughing up blood.  States that the chest pain started this am, states that he woke up with light headedness and dizziness, states that this is when the chest pain started.  Pt talking in full sentences, pt states that he is normally on 8L O2 via [place] at home.  States that the coughing up blood started wednesday

## 2020-02-17 NOTE — ED Notes (Signed)
Pt [place] turned down to 6 L O2, pt still satting 95%

## 2020-02-17 NOTE — Discharge Instructions (Signed)
You were seen in the emergency room today with coughing up blood.  Your CT scan shows no blood clots in the lungs, obvious bleeding, and shows that your area of infection is decreasing in size overall.  Please continue all your home medications and call your primary care doctor as well as your pulmonary and infectious disease doctors this afternoon.  Please try and schedule appointments for next week as follow-up from your ED visit.  If you develop shortness of breath, chest pain, or develop additional coughing with severe bleeding please return to the emergency department immediately and/or call 911.

## 2020-02-22 DIAGNOSIS — J449 Chronic obstructive pulmonary disease, unspecified: Secondary | ICD-10-CM | POA: Diagnosis not present

## 2020-02-23 ENCOUNTER — Telehealth: Payer: Self-pay | Admitting: Pharmacist

## 2020-02-23 NOTE — Telephone Encounter (Signed)
A refill of clofazimine has arrived to clinic and is located in the pharmacy office when patient needs it.

## 2020-02-27 NOTE — Telephone Encounter (Signed)
Sounds good. I will put it up front for her. Thank you!

## 2020-02-28 DIAGNOSIS — Z515 Encounter for palliative care: Secondary | ICD-10-CM | POA: Diagnosis not present

## 2020-02-28 DIAGNOSIS — J449 Chronic obstructive pulmonary disease, unspecified: Secondary | ICD-10-CM | POA: Diagnosis not present

## 2020-02-28 DIAGNOSIS — M545 Low back pain: Secondary | ICD-10-CM | POA: Diagnosis not present

## 2020-02-28 DIAGNOSIS — Z79891 Long term (current) use of opiate analgesic: Secondary | ICD-10-CM | POA: Diagnosis not present

## 2020-03-24 DIAGNOSIS — J449 Chronic obstructive pulmonary disease, unspecified: Secondary | ICD-10-CM | POA: Diagnosis not present

## 2020-03-26 ENCOUNTER — Telehealth (INDEPENDENT_AMBULATORY_CARE_PROVIDER_SITE_OTHER): Payer: Medicare Other | Admitting: Internal Medicine

## 2020-03-26 ENCOUNTER — Other Ambulatory Visit: Payer: Self-pay

## 2020-03-26 DIAGNOSIS — A310 Pulmonary mycobacterial infection: Secondary | ICD-10-CM

## 2020-03-26 IMAGING — US VENOUS DOPPLER ULTRASOUND OF LEFT LOWER EXTREMITY
1 series · 13 of 24 positions shown · non-contrast
Comparison: None.

CLINICAL DATA: 58-year-old male with left groin pain for the past 2
weeks



[Series 1: venous doppler ultrasound of left lower extremity · 13 of 43 slices shown]
[im 1/43]
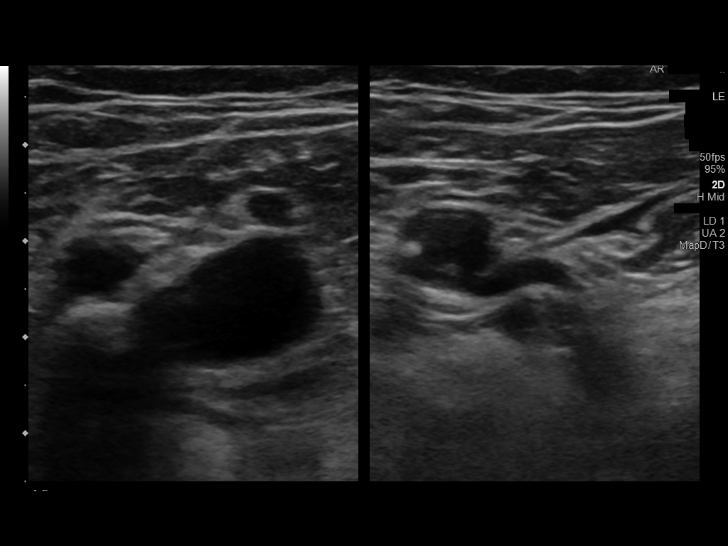
[im 4/43]
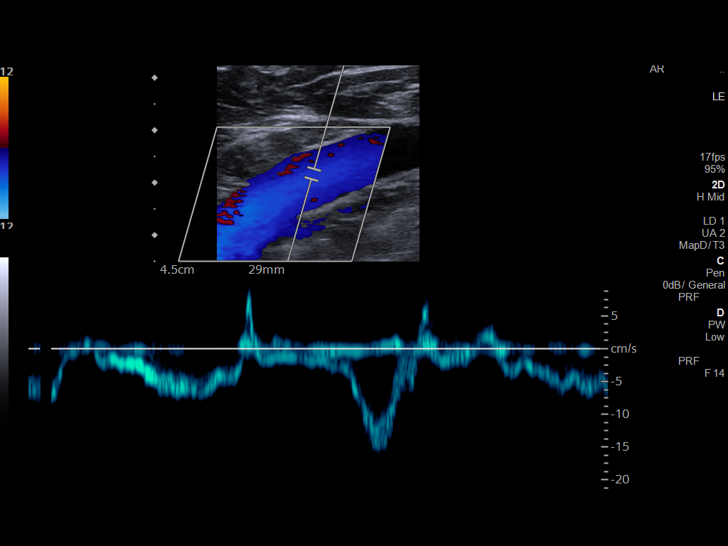
[im 8/43]
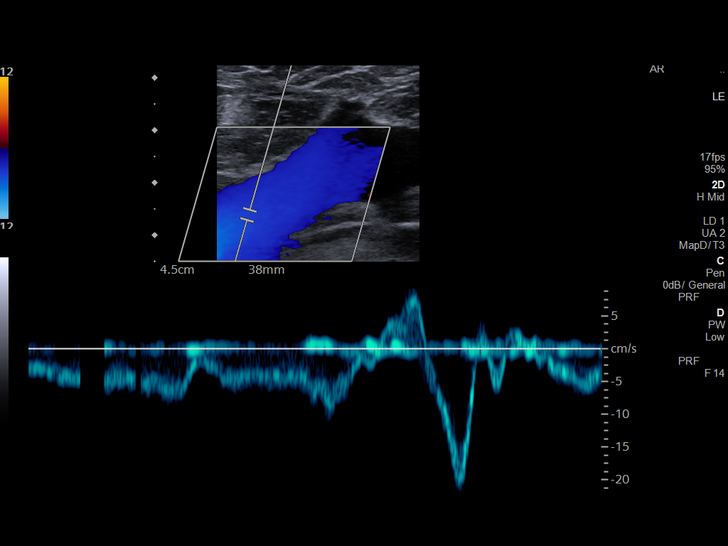
[im 11/43]
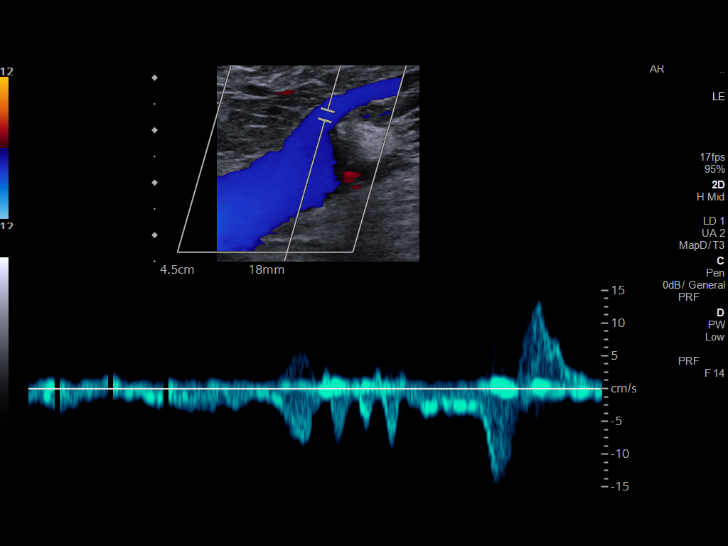
[im 15/43]
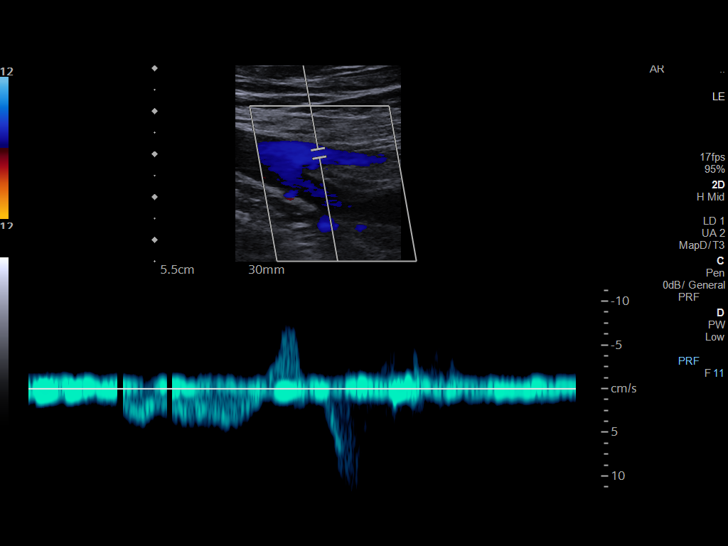
[im 19/43]
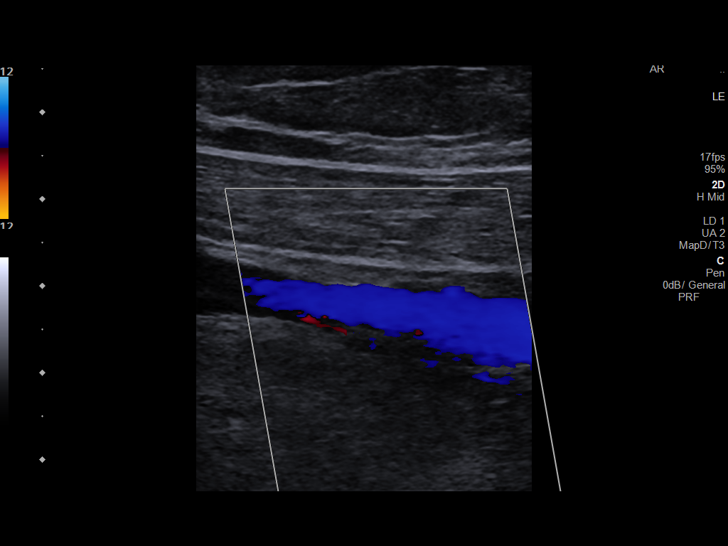
[im 22/43]
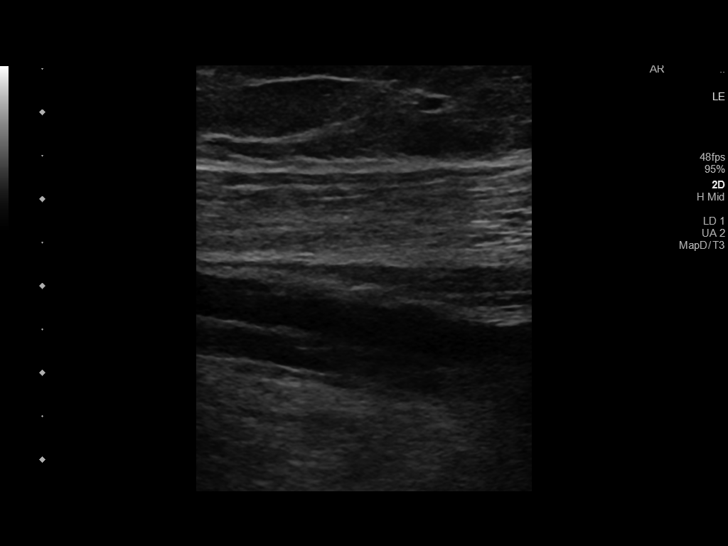
[im 24/43]
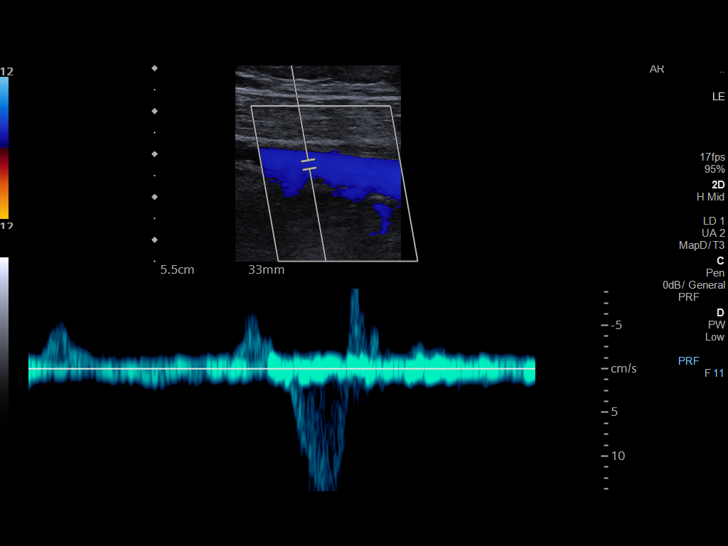
[im 28/43]
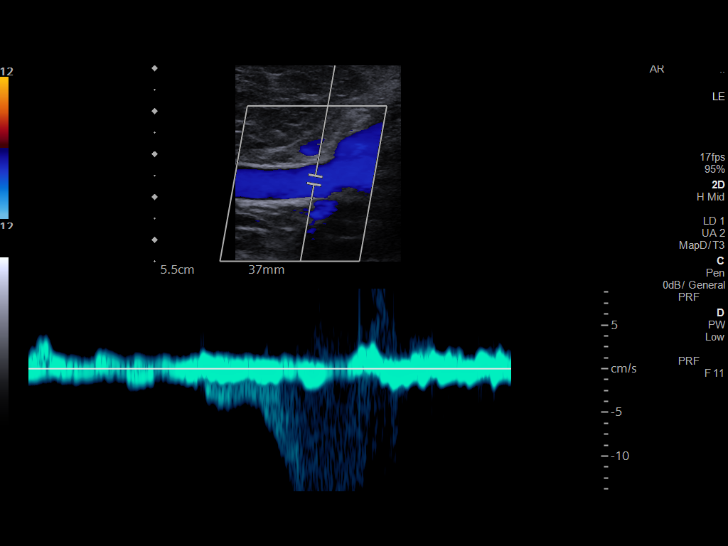
[im 32/43]
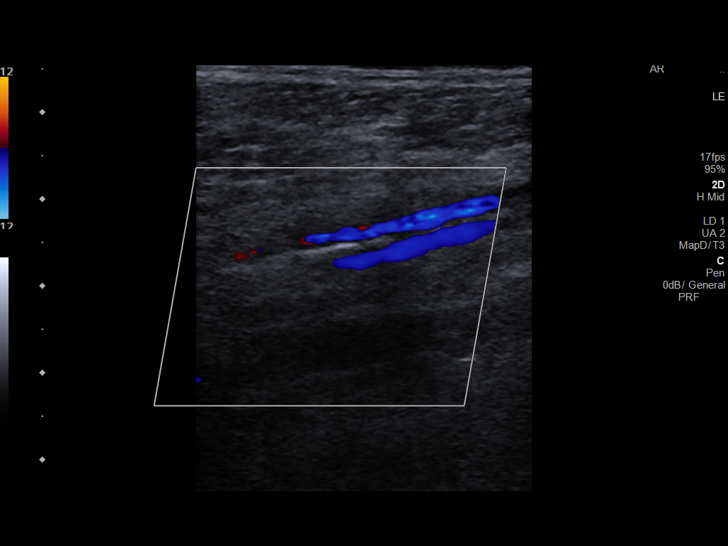
[im 35/43]
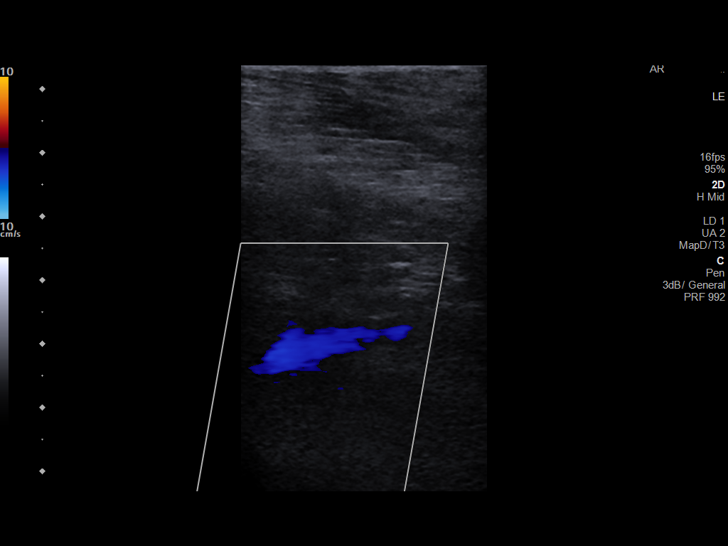
[im 39/43]
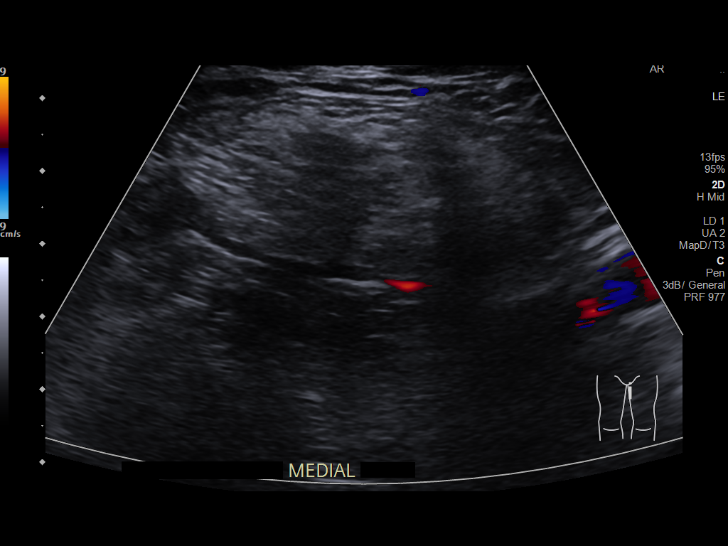
[im 43/43]
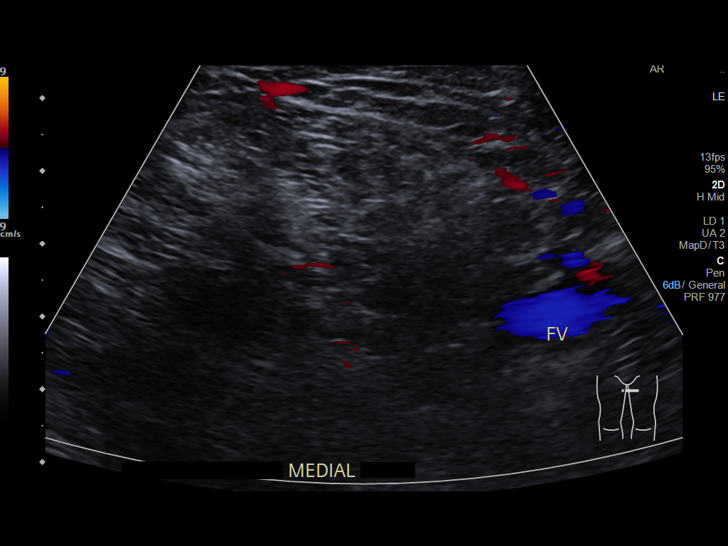

[13 of 24 positions shown; findings below may reference images not displayed]

FINDINGS: Contralateral Common Femoral Vein: Respiratory phasicity is normal
and symmetric with the symptomatic side. No evidence of thrombus.
Normal compressibility.

Common Femoral Vein: No evidence of thrombus. Normal
compressibility, respiratory phasicity and response to augmentation.

Saphenofemoral Junction: No evidence of thrombus. Normal
compressibility and flow on color Doppler imaging.

Profunda Femoral Vein: No evidence of thrombus. Normal
compressibility and flow on color Doppler imaging.

Femoral Vein: No evidence of thrombus. Normal compressibility,
respiratory phasicity and response to augmentation.

Popliteal Vein: No evidence of thrombus. Normal compressibility,
respiratory phasicity and response to augmentation.

Calf Veins: No evidence of thrombus. Normal compressibility and flow
on color Doppler imaging.

Superficial Great Saphenous Vein: No evidence of thrombus. Normal
compressibility.

Venous Reflux:  None.

Other Findings: Additional sonographic interrogation of the medial
upper thigh in the region of pain demonstrates a subtle ovoid region
of heterogeneous echogenicity. No evidence of internal color flow on
color Doppler imaging. No adenopathy visualized.
IMPRESSION: 1. No evidence of deep venous thrombosis within the left lower
extremity.
2. Sonographic interrogation of the region of the painful lump in
the medial upper thigh demonstrates an ovoid fatty lesion without
evidence of internal vascularity. This may represent a lipoma.

## 2020-03-26 NOTE — Progress Notes (Signed)
No show for phone visit.  He has missed 3 appointments and completed a long course of antibiotics for MAI infection and will have him stop after he completes his current fill of medications.   I have removed the medications from his list.  Thayer Headings, MD

## 2020-04-03 DIAGNOSIS — Z515 Encounter for palliative care: Secondary | ICD-10-CM | POA: Diagnosis not present

## 2020-04-03 DIAGNOSIS — Z9189 Other specified personal risk factors, not elsewhere classified: Secondary | ICD-10-CM | POA: Diagnosis not present

## 2020-04-03 DIAGNOSIS — Z760 Encounter for issue of repeat prescription: Secondary | ICD-10-CM | POA: Diagnosis not present

## 2020-04-03 DIAGNOSIS — M545 Low back pain: Secondary | ICD-10-CM | POA: Diagnosis not present

## 2020-04-23 DIAGNOSIS — J449 Chronic obstructive pulmonary disease, unspecified: Secondary | ICD-10-CM | POA: Diagnosis not present

## 2020-05-04 DIAGNOSIS — Z9189 Other specified personal risk factors, not elsewhere classified: Secondary | ICD-10-CM | POA: Diagnosis not present

## 2020-05-04 DIAGNOSIS — Z515 Encounter for palliative care: Secondary | ICD-10-CM | POA: Diagnosis not present

## 2020-05-04 DIAGNOSIS — Z79899 Other long term (current) drug therapy: Secondary | ICD-10-CM | POA: Diagnosis not present

## 2020-05-04 DIAGNOSIS — M545 Low back pain, unspecified: Secondary | ICD-10-CM | POA: Diagnosis not present

## 2020-05-24 DIAGNOSIS — J449 Chronic obstructive pulmonary disease, unspecified: Secondary | ICD-10-CM | POA: Diagnosis not present

## 2020-06-04 DIAGNOSIS — M545 Low back pain, unspecified: Secondary | ICD-10-CM | POA: Diagnosis not present

## 2020-06-04 DIAGNOSIS — Z79891 Long term (current) use of opiate analgesic: Secondary | ICD-10-CM | POA: Diagnosis not present

## 2020-06-04 DIAGNOSIS — Z79899 Other long term (current) drug therapy: Secondary | ICD-10-CM | POA: Diagnosis not present

## 2020-06-04 DIAGNOSIS — J9611 Chronic respiratory failure with hypoxia: Secondary | ICD-10-CM | POA: Diagnosis not present

## 2020-06-04 DIAGNOSIS — Z515 Encounter for palliative care: Secondary | ICD-10-CM | POA: Diagnosis not present

## 2020-06-18 ENCOUNTER — Telehealth: Payer: Self-pay | Admitting: Pharmacist

## 2020-06-18 MED ORDER — AMBULATORY NON FORMULARY MEDICATION: 100.0000 mg | Freq: Every day | 11 refills | Status: AC

## 2020-06-18 NOTE — Telephone Encounter (Signed)
Patient's wife picked up 2 bottles of clofazimine on 11/29.

## 2020-06-23 DIAGNOSIS — J449 Chronic obstructive pulmonary disease, unspecified: Secondary | ICD-10-CM | POA: Diagnosis not present

## 2020-06-28 DIAGNOSIS — E1121 Type 2 diabetes mellitus with diabetic nephropathy: Secondary | ICD-10-CM | POA: Diagnosis not present

## 2020-06-28 DIAGNOSIS — M545 Low back pain, unspecified: Secondary | ICD-10-CM | POA: Diagnosis not present

## 2020-06-28 DIAGNOSIS — Z515 Encounter for palliative care: Secondary | ICD-10-CM | POA: Diagnosis not present

## 2020-07-21 IMAGING — CR DG CHEST 1V PORT
1 series · 1 of 1 positions shown · non-contrast
Comparison: September 17, 2018

CLINICAL DATA: Worsening shortness of breath

EXAM:
PORTABLE CHEST 1 VIEW

[portable]
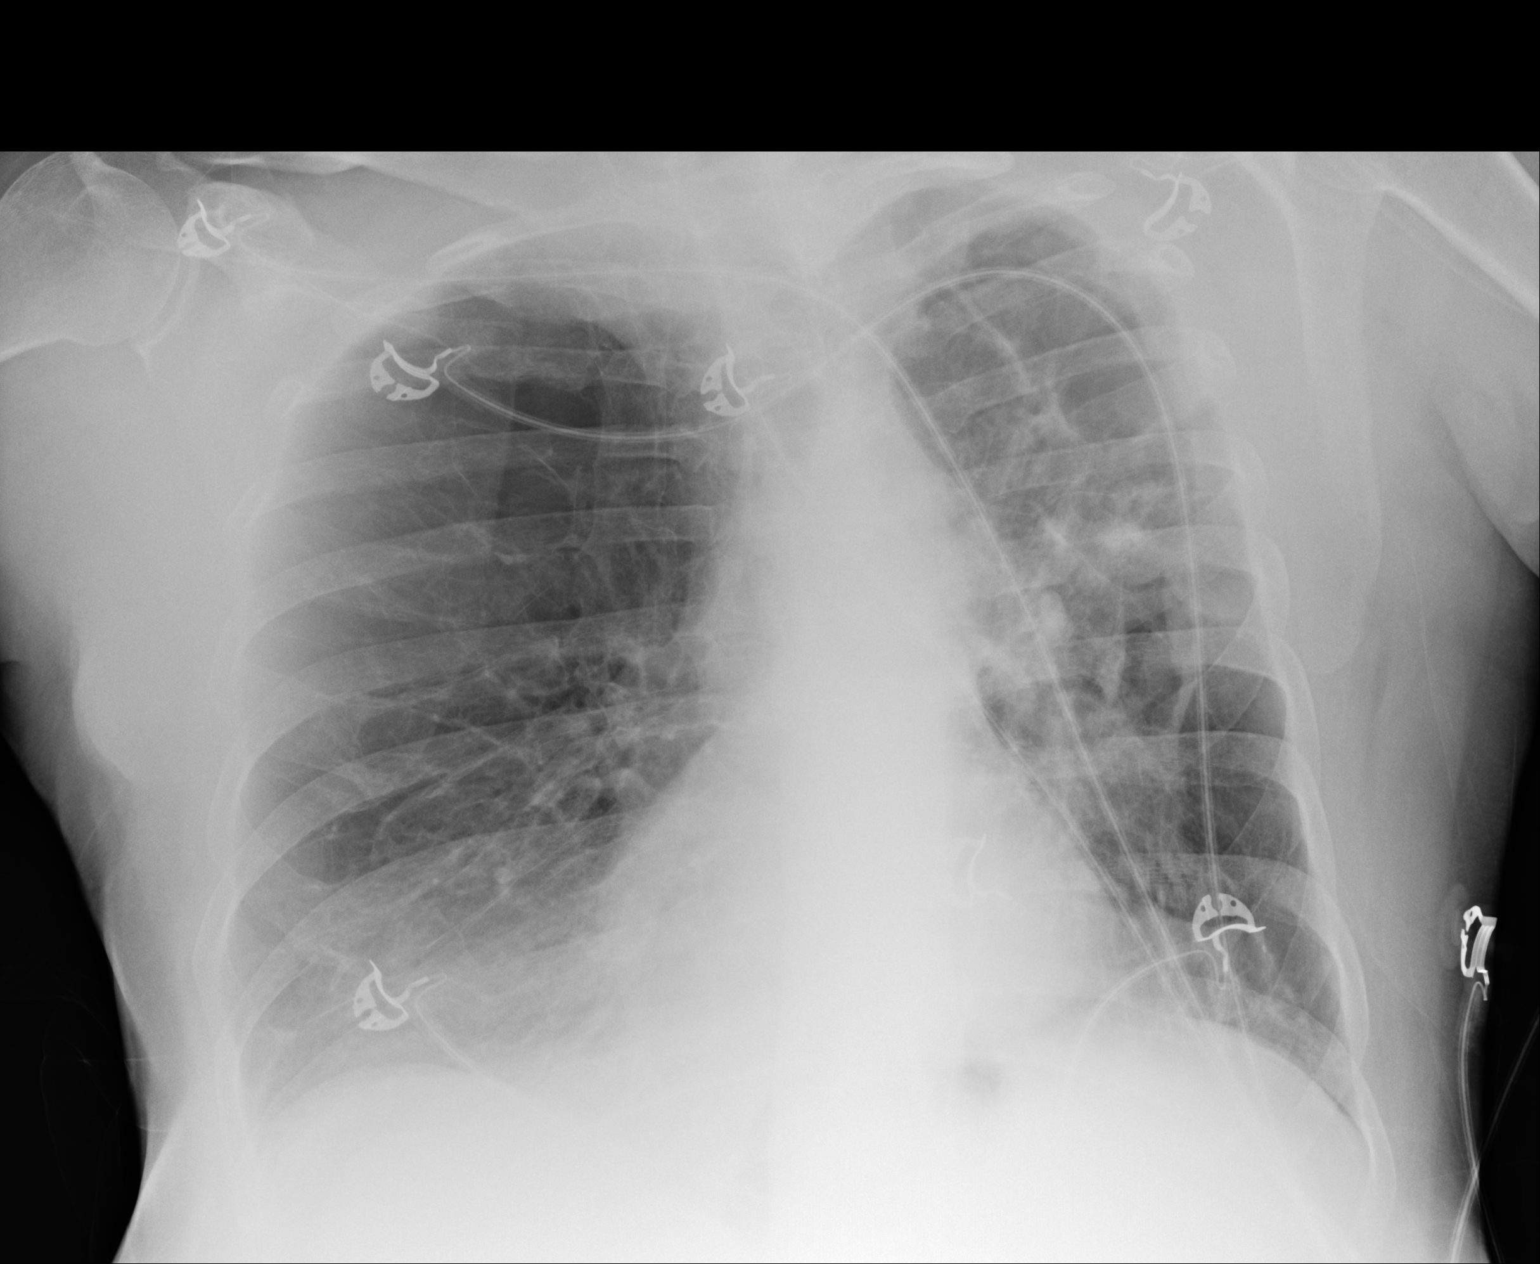

[1 of 1 positions shown; findings below may reference images not displayed]

FINDINGS: The heart size and mediastinal contours are within normal limits.
There is pulmonary vascular congestion. Hazy airspace opacity seen
at the right lung base. Again noted is left apical pleural
thickening. There is a emphysematous changes seen at the upper
lungs.
IMPRESSION: Pulmonary vascular congestion.

Hazy airspace opacity at the right lung base which could be due to
small layering effusion atelectasis and/or early infectious
etiology.

## 2020-07-22 IMAGING — US US EXTREM LOW VENOUS*R*
1 series · 14 of 24 positions shown · non-contrast
Comparison: 01/07/2019

CLINICAL DATA: Swelling, pain

EXAM:
RIGHT LOWER EXTREMITY VENOUS DOPPLER ULTRASOUND
TECHNIQUE: Gray-scale sonography with compression, as well as color and duplex
ultrasound, were performed to evaluate the deep venous system from
the level of the common femoral vein through the popliteal and
proximal calf veins.

[Series 2: us extrem low venous*right* · 0.10mm/px · 14 of 35 slices shown]
[im 1/35]
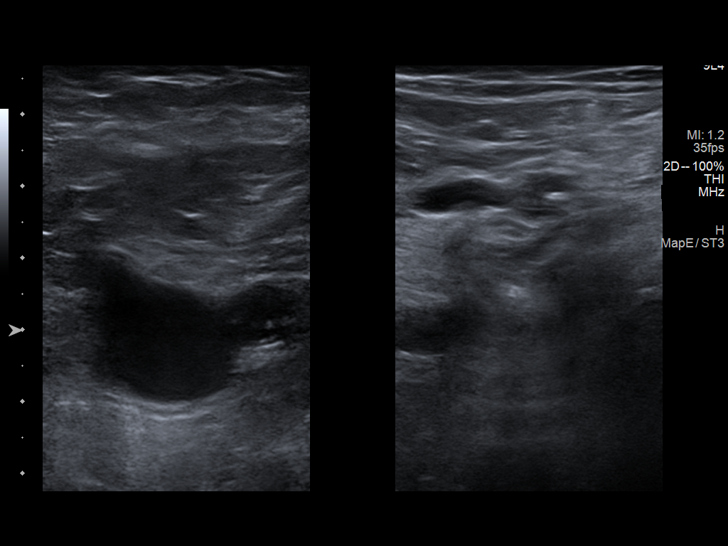
[im 3/35]
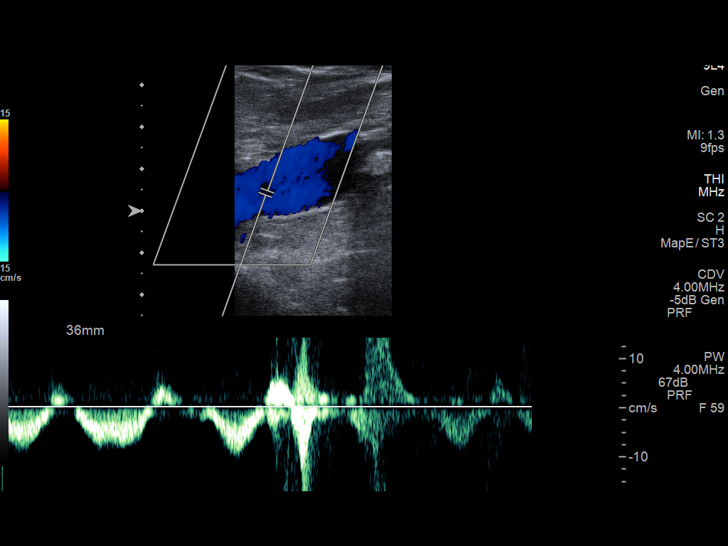
[im 6/35]
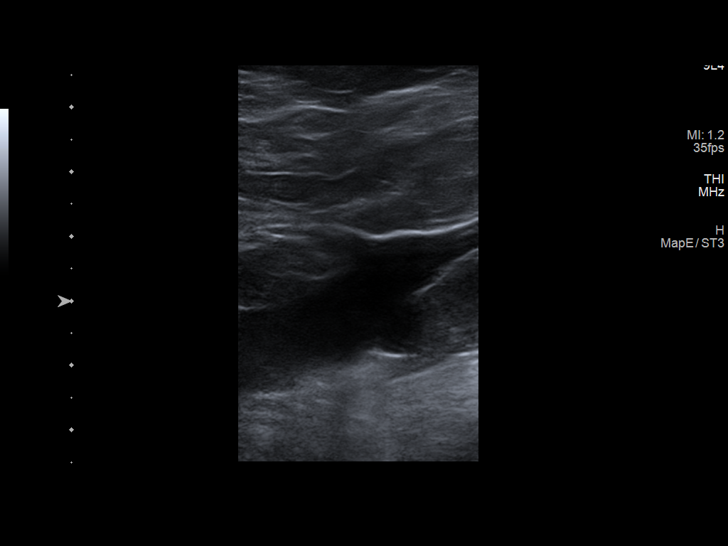
[im 9/35]
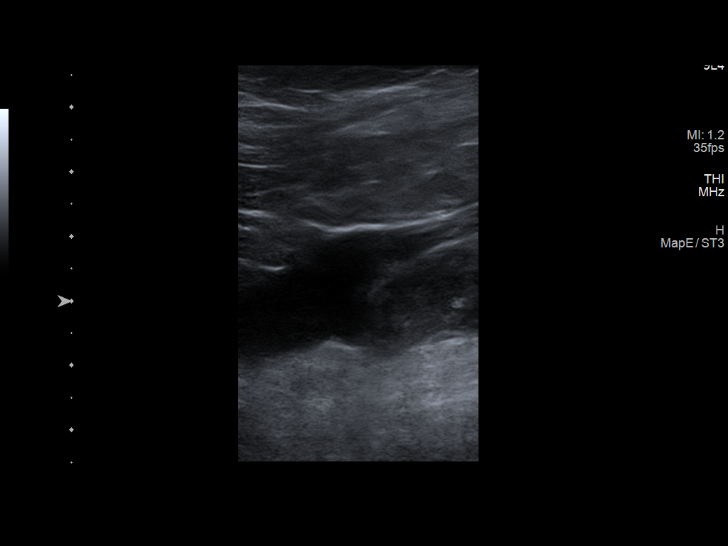
[im 11/35]
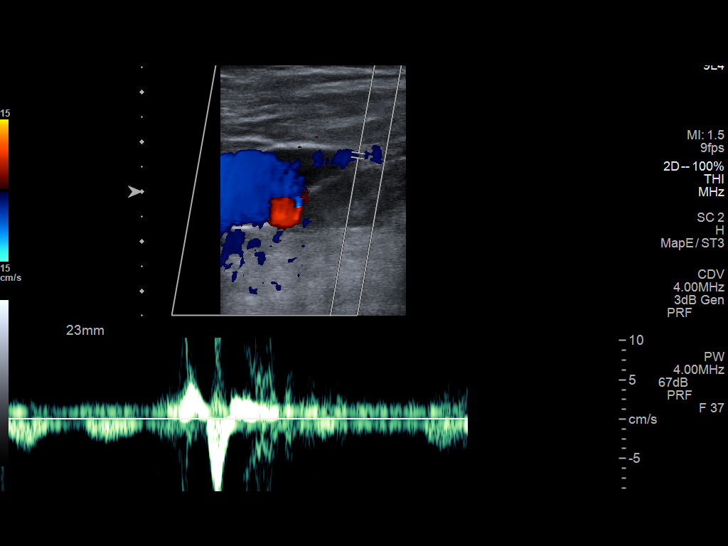
[im 14/35]
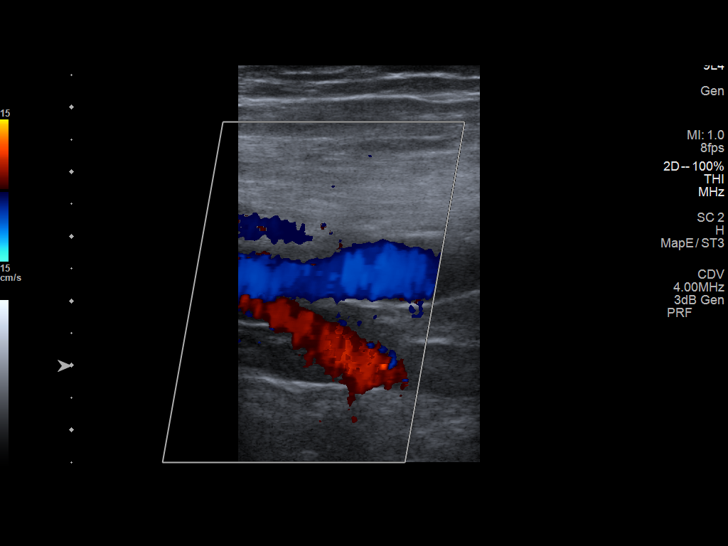
[im 17/35]
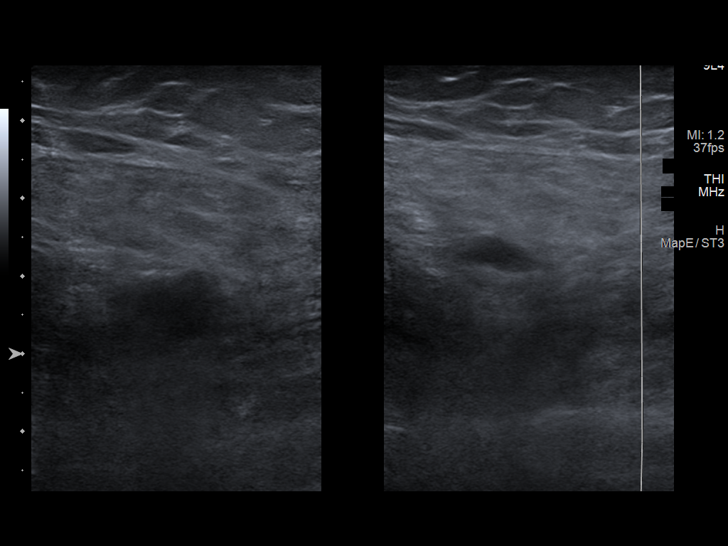
[im 18/35]
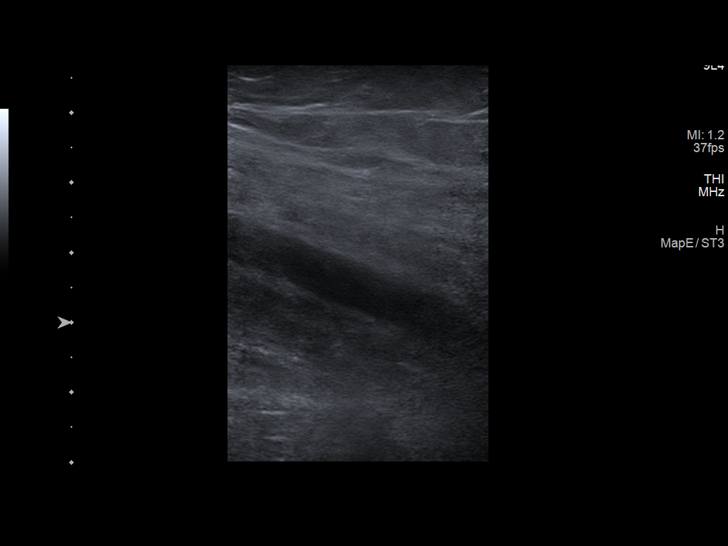
[im 21/35]
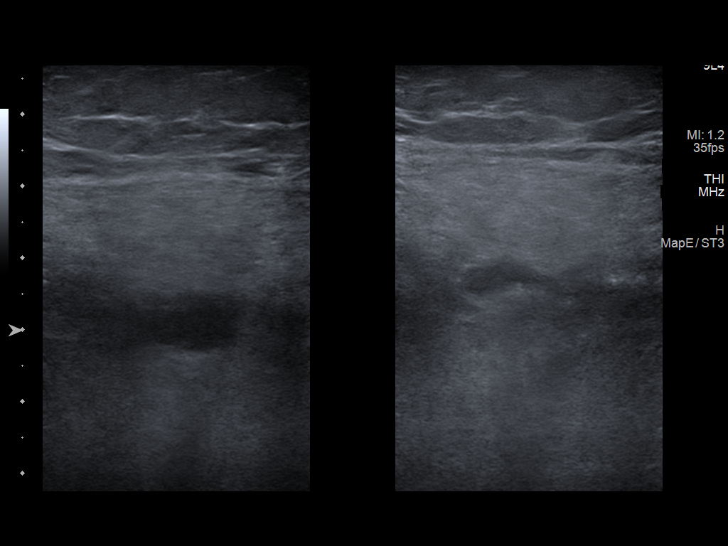
[im 24/35]
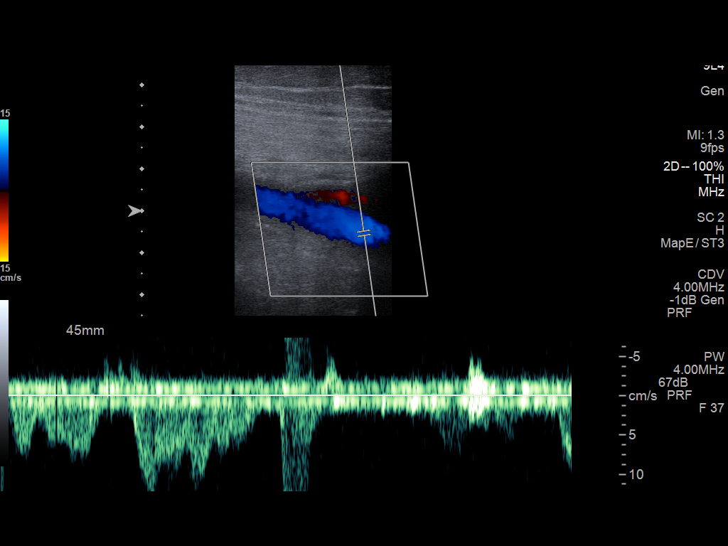
[im 27/35]
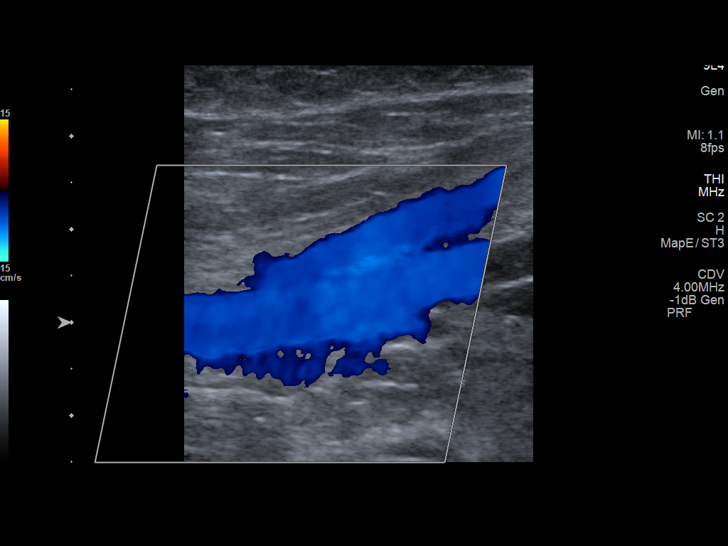
[im 29/35]
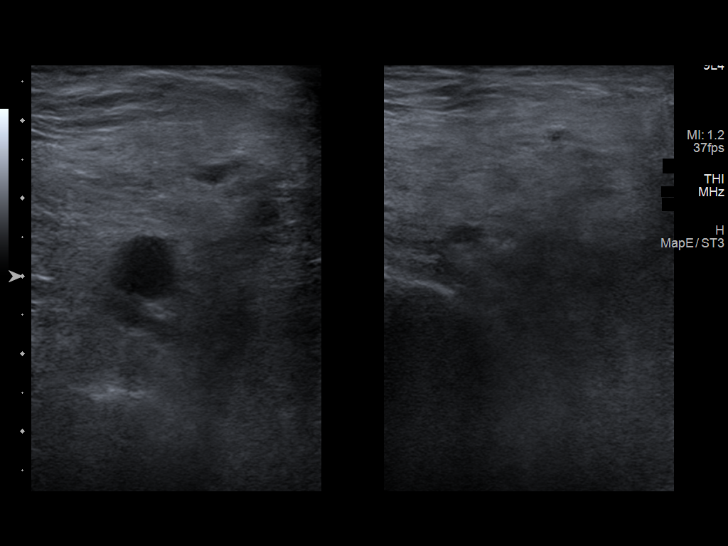
[im 32/35]
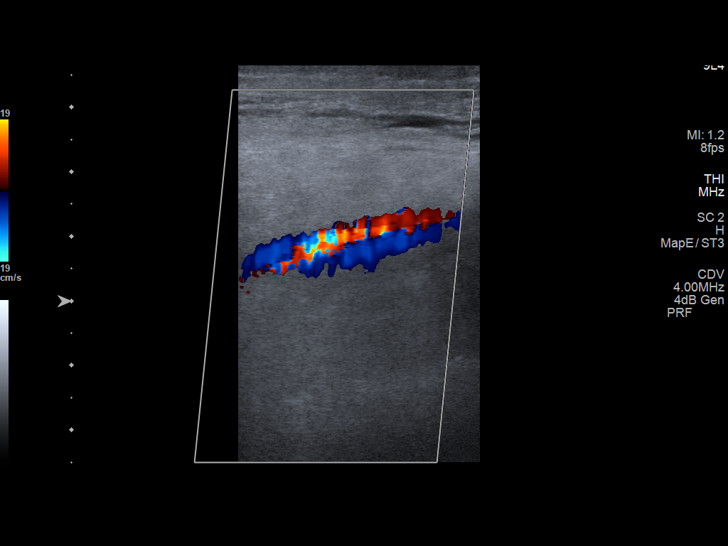
[im 35/35]
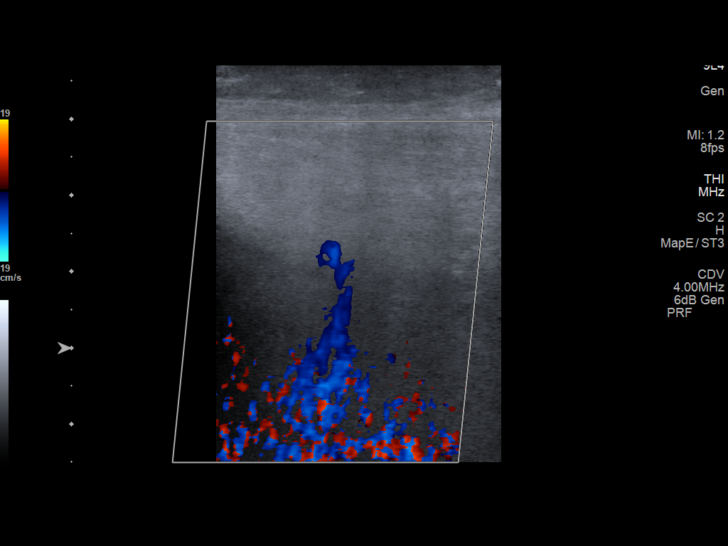

[14 of 24 positions shown; findings below may reference images not displayed]

FINDINGS: Normal compressibility of the common femoral, superficial femoral,
and popliteal veins, as well as the proximal calf veins. No filling
defects to suggest DVT on grayscale or color Doppler imaging.
Doppler waveforms show normal direction of venous flow, normal
respiratory phasicity and response to augmentation.

Subcutaneous edema in the calf.

Survey views of the contralateral common femoral vein are
unremarkable.
IMPRESSION: No femoropopliteal and no calf DVT in the visualized calf veins. If
clinical symptoms are inconsistent or if there are persistent or
worsening symptoms, further imaging (possibly involving the iliac
veins) may be warranted.

## 2020-07-24 DIAGNOSIS — J449 Chronic obstructive pulmonary disease, unspecified: Secondary | ICD-10-CM | POA: Diagnosis not present

## 2020-08-01 ENCOUNTER — Other Ambulatory Visit: Payer: Self-pay | Admitting: Internal Medicine

## 2020-08-02 DIAGNOSIS — Z9189 Other specified personal risk factors, not elsewhere classified: Secondary | ICD-10-CM | POA: Diagnosis not present

## 2020-08-02 DIAGNOSIS — Z515 Encounter for palliative care: Secondary | ICD-10-CM | POA: Diagnosis not present

## 2020-08-02 DIAGNOSIS — M545 Low back pain, unspecified: Secondary | ICD-10-CM | POA: Diagnosis not present

## 2020-08-03 ENCOUNTER — Encounter (HOSPITAL_COMMUNITY): Payer: Self-pay

## 2020-08-03 ENCOUNTER — Emergency Department (HOSPITAL_COMMUNITY): Payer: Medicare Other

## 2020-08-03 ENCOUNTER — Inpatient Hospital Stay (HOSPITAL_COMMUNITY)
Admission: EM | Admit: 2020-08-03 | Discharge: 2020-08-14 | DRG: 208 | Disposition: E | Payer: Medicare Other | Attending: Internal Medicine | Admitting: Internal Medicine

## 2020-08-03 ENCOUNTER — Other Ambulatory Visit: Payer: Self-pay

## 2020-08-03 DIAGNOSIS — Z8249 Family history of ischemic heart disease and other diseases of the circulatory system: Secondary | ICD-10-CM

## 2020-08-03 DIAGNOSIS — J690 Pneumonitis due to inhalation of food and vomit: Secondary | ICD-10-CM | POA: Diagnosis not present

## 2020-08-03 DIAGNOSIS — Z66 Do not resuscitate: Secondary | ICD-10-CM | POA: Diagnosis not present

## 2020-08-03 DIAGNOSIS — Z881 Allergy status to other antibiotic agents status: Secondary | ICD-10-CM

## 2020-08-03 DIAGNOSIS — Z79899 Other long term (current) drug therapy: Secondary | ICD-10-CM

## 2020-08-03 DIAGNOSIS — D638 Anemia in other chronic diseases classified elsewhere: Secondary | ICD-10-CM | POA: Diagnosis present

## 2020-08-03 DIAGNOSIS — Z7951 Long term (current) use of inhaled steroids: Secondary | ICD-10-CM

## 2020-08-03 DIAGNOSIS — Z20822 Contact with and (suspected) exposure to covid-19: Secondary | ICD-10-CM | POA: Diagnosis present

## 2020-08-03 DIAGNOSIS — M549 Dorsalgia, unspecified: Secondary | ICD-10-CM | POA: Diagnosis not present

## 2020-08-03 DIAGNOSIS — K2100 Gastro-esophageal reflux disease with esophagitis, without bleeding: Secondary | ICD-10-CM | POA: Diagnosis not present

## 2020-08-03 DIAGNOSIS — R1110 Vomiting, unspecified: Secondary | ICD-10-CM

## 2020-08-03 DIAGNOSIS — Z781 Physical restraint status: Secondary | ICD-10-CM

## 2020-08-03 DIAGNOSIS — I480 Paroxysmal atrial fibrillation: Secondary | ICD-10-CM | POA: Diagnosis present

## 2020-08-03 DIAGNOSIS — E440 Moderate protein-calorie malnutrition: Secondary | ICD-10-CM | POA: Diagnosis present

## 2020-08-03 DIAGNOSIS — J9622 Acute and chronic respiratory failure with hypercapnia: Secondary | ICD-10-CM | POA: Diagnosis not present

## 2020-08-03 DIAGNOSIS — Z9981 Dependence on supplemental oxygen: Secondary | ICD-10-CM | POA: Diagnosis not present

## 2020-08-03 DIAGNOSIS — Z794 Long term (current) use of insulin: Secondary | ICD-10-CM

## 2020-08-03 DIAGNOSIS — Z888 Allergy status to other drugs, medicaments and biological substances status: Secondary | ICD-10-CM

## 2020-08-03 DIAGNOSIS — R5381 Other malaise: Secondary | ICD-10-CM | POA: Diagnosis present

## 2020-08-03 DIAGNOSIS — Z515 Encounter for palliative care: Secondary | ICD-10-CM

## 2020-08-03 DIAGNOSIS — I499 Cardiac arrhythmia, unspecified: Secondary | ICD-10-CM | POA: Diagnosis not present

## 2020-08-03 DIAGNOSIS — N400 Enlarged prostate without lower urinary tract symptoms: Secondary | ICD-10-CM | POA: Diagnosis present

## 2020-08-03 DIAGNOSIS — J9621 Acute and chronic respiratory failure with hypoxia: Secondary | ICD-10-CM | POA: Diagnosis present

## 2020-08-03 DIAGNOSIS — E7800 Pure hypercholesterolemia, unspecified: Secondary | ICD-10-CM | POA: Diagnosis present

## 2020-08-03 DIAGNOSIS — M47816 Spondylosis without myelopathy or radiculopathy, lumbar region: Secondary | ICD-10-CM | POA: Diagnosis not present

## 2020-08-03 DIAGNOSIS — E119 Type 2 diabetes mellitus without complications: Secondary | ICD-10-CM | POA: Diagnosis present

## 2020-08-03 DIAGNOSIS — Z8701 Personal history of pneumonia (recurrent): Secondary | ICD-10-CM

## 2020-08-03 DIAGNOSIS — I5032 Chronic diastolic (congestive) heart failure: Secondary | ICD-10-CM | POA: Diagnosis not present

## 2020-08-03 DIAGNOSIS — R911 Solitary pulmonary nodule: Secondary | ICD-10-CM | POA: Diagnosis not present

## 2020-08-03 DIAGNOSIS — K3189 Other diseases of stomach and duodenum: Secondary | ICD-10-CM | POA: Diagnosis not present

## 2020-08-03 DIAGNOSIS — G8929 Other chronic pain: Secondary | ICD-10-CM | POA: Diagnosis not present

## 2020-08-03 DIAGNOSIS — R140 Abdominal distension (gaseous): Secondary | ICD-10-CM | POA: Diagnosis not present

## 2020-08-03 DIAGNOSIS — I2510 Atherosclerotic heart disease of native coronary artery without angina pectoris: Secondary | ICD-10-CM | POA: Diagnosis present

## 2020-08-03 DIAGNOSIS — D751 Secondary polycythemia: Secondary | ICD-10-CM | POA: Diagnosis present

## 2020-08-03 DIAGNOSIS — R6889 Other general symptoms and signs: Secondary | ICD-10-CM | POA: Diagnosis not present

## 2020-08-03 DIAGNOSIS — J441 Chronic obstructive pulmonary disease with (acute) exacerbation: Principal | ICD-10-CM | POA: Diagnosis present

## 2020-08-03 DIAGNOSIS — J9601 Acute respiratory failure with hypoxia: Secondary | ICD-10-CM | POA: Diagnosis not present

## 2020-08-03 DIAGNOSIS — J449 Chronic obstructive pulmonary disease, unspecified: Secondary | ICD-10-CM | POA: Diagnosis present

## 2020-08-03 DIAGNOSIS — Z833 Family history of diabetes mellitus: Secondary | ICD-10-CM

## 2020-08-03 DIAGNOSIS — R0603 Acute respiratory distress: Secondary | ICD-10-CM | POA: Diagnosis not present

## 2020-08-03 DIAGNOSIS — Z7189 Other specified counseling: Secondary | ICD-10-CM | POA: Diagnosis not present

## 2020-08-03 DIAGNOSIS — F419 Anxiety disorder, unspecified: Secondary | ICD-10-CM | POA: Diagnosis present

## 2020-08-03 DIAGNOSIS — Z87891 Personal history of nicotine dependence: Secondary | ICD-10-CM

## 2020-08-03 DIAGNOSIS — Z743 Need for continuous supervision: Secondary | ICD-10-CM | POA: Diagnosis not present

## 2020-08-03 DIAGNOSIS — R0602 Shortness of breath: Secondary | ICD-10-CM | POA: Diagnosis not present

## 2020-08-03 DIAGNOSIS — R000 Tachycardia, unspecified: Secondary | ICD-10-CM | POA: Diagnosis not present

## 2020-08-03 DIAGNOSIS — K219 Gastro-esophageal reflux disease without esophagitis: Secondary | ICD-10-CM | POA: Diagnosis present

## 2020-08-03 DIAGNOSIS — R0689 Other abnormalities of breathing: Secondary | ICD-10-CM | POA: Diagnosis not present

## 2020-08-03 DIAGNOSIS — D509 Iron deficiency anemia, unspecified: Secondary | ICD-10-CM | POA: Diagnosis present

## 2020-08-03 DIAGNOSIS — J439 Emphysema, unspecified: Secondary | ICD-10-CM | POA: Diagnosis not present

## 2020-08-03 DIAGNOSIS — R0600 Dyspnea, unspecified: Secondary | ICD-10-CM | POA: Diagnosis not present

## 2020-08-03 DIAGNOSIS — E8809 Other disorders of plasma-protein metabolism, not elsewhere classified: Secondary | ICD-10-CM | POA: Diagnosis present

## 2020-08-03 DIAGNOSIS — J9620 Acute and chronic respiratory failure, unspecified whether with hypoxia or hypercapnia: Secondary | ICD-10-CM

## 2020-08-03 DIAGNOSIS — R069 Unspecified abnormalities of breathing: Secondary | ICD-10-CM | POA: Diagnosis not present

## 2020-08-03 DIAGNOSIS — Z7984 Long term (current) use of oral hypoglycemic drugs: Secondary | ICD-10-CM

## 2020-08-03 DIAGNOSIS — E785 Hyperlipidemia, unspecified: Secondary | ICD-10-CM | POA: Diagnosis present

## 2020-08-03 HISTORY — DX: Solitary pulmonary nodule: R91.1

## 2020-08-03 LAB — CBG MONITORING, ED: Glucose-Capillary: 248 mg/dL — ABNORMAL HIGH (ref 70–99)

## 2020-08-03 LAB — CBC WITH DIFFERENTIAL/PLATELET
Abs Immature Granulocytes: 0.04 10*3/uL (ref 0.00–0.07)
Basophils Absolute: 0 10*3/uL (ref 0.0–0.1)
Basophils Relative: 0 %
Eosinophils Absolute: 0 10*3/uL (ref 0.0–0.5)
Eosinophils Relative: 0 %
HCT: 30.1 % — ABNORMAL LOW (ref 39.0–52.0)
Hemoglobin: 9.2 g/dL — ABNORMAL LOW (ref 13.0–17.0)
Immature Granulocytes: 0 %
Lymphocytes Relative: 4 %
Lymphs Abs: 0.3 10*3/uL — ABNORMAL LOW (ref 0.7–4.0)
MCH: 24.7 pg — ABNORMAL LOW (ref 26.0–34.0)
MCHC: 30.6 g/dL (ref 30.0–36.0)
MCV: 80.9 fL (ref 80.0–100.0)
Monocytes Absolute: 0.4 10*3/uL (ref 0.1–1.0)
Monocytes Relative: 5 %
Neutro Abs: 8.2 10*3/uL — ABNORMAL HIGH (ref 1.7–7.7)
Neutrophils Relative %: 91 %
Platelets: 255 10*3/uL (ref 150–400)
RBC: 3.72 MIL/uL — ABNORMAL LOW (ref 4.22–5.81)
RDW: 16.6 % — ABNORMAL HIGH (ref 11.5–15.5)
WBC: 9.1 10*3/uL (ref 4.0–10.5)
nRBC: 0 % (ref 0.0–0.2)

## 2020-08-03 LAB — COMPREHENSIVE METABOLIC PANEL
ALT: 19 U/L (ref 0–44)
AST: 18 U/L (ref 15–41)
Albumin: 2.9 g/dL — ABNORMAL LOW (ref 3.5–5.0)
Alkaline Phosphatase: 48 U/L (ref 38–126)
Anion gap: 12 (ref 5–15)
BUN: 15 mg/dL (ref 8–23)
CO2: 28 mmol/L (ref 22–32)
Calcium: 8 mg/dL — ABNORMAL LOW (ref 8.9–10.3)
Chloride: 93 mmol/L — ABNORMAL LOW (ref 98–111)
Creatinine, Ser: 1.01 mg/dL (ref 0.61–1.24)
GFR, Estimated: >60 mL/min (ref >60)
Glucose, Bld: 263 mg/dL — ABNORMAL HIGH (ref 70–99)
Potassium: 4.4 mmol/L (ref 3.5–5.1)
Sodium: 133 mmol/L — ABNORMAL LOW (ref 135–145)
Total Bilirubin: 0.2 mg/dL — ABNORMAL LOW (ref 0.3–1.2)
Total Protein: 6.6 g/dL (ref 6.5–8.1)

## 2020-08-03 LAB — LIPASE, BLOOD: Lipase: 16 U/L (ref 11–51)

## 2020-08-03 LAB — BLOOD GAS, ARTERIAL
Acid-Base Excess: 3.4 mmol/L — ABNORMAL HIGH (ref 0.0–2.0)
Bicarbonate: 26.9 mmol/L (ref 20.0–28.0)
FIO2: 40
O2 Saturation: 97.6 %
Patient temperature: 36.9
pCO2 arterial: 54.5 mmHg — ABNORMAL HIGH (ref 32.0–48.0)
pH, Arterial: 7.341 — ABNORMAL LOW (ref 7.350–7.450)
pO2, Arterial: 115 mmHg — ABNORMAL HIGH (ref 83.0–108.0)

## 2020-08-03 LAB — TROPONIN I (HIGH SENSITIVITY)
Troponin I (High Sensitivity): 5 ng/L (ref <18)
Troponin I (High Sensitivity): 4 ng/L (ref <18)

## 2020-08-03 LAB — BRAIN NATRIURETIC PEPTIDE: B Natriuretic Peptide: 74 pg/mL (ref 0.0–100.0)

## 2020-08-03 LAB — SARS CORONAVIRUS 2 BY RT PCR (HOSPITAL ORDER, PERFORMED IN ~~LOC~~ HOSPITAL LAB): SARS Coronavirus 2: NEGATIVE

## 2020-08-03 MED ORDER — DILTIAZEM HCL ER COATED BEADS 120 MG PO CP24
120.0000 mg | ORAL_CAPSULE | Freq: Every day | ORAL | Status: DC
  Administered 2020-08-04: 120 mg via ORAL
  Filled 2020-08-03: qty 1

## 2020-08-03 MED ORDER — PROCHLORPERAZINE EDISYLATE 10 MG/2ML IJ SOLN
5.0000 mg | INTRAMUSCULAR | Status: DC | PRN
  Administered 2020-08-04 (×2): 5 mg via INTRAVENOUS
  Filled 2020-08-03 (×2): qty 2

## 2020-08-03 MED ORDER — GUAIFENESIN ER 600 MG PO TB12: 600.0000 mg | ORAL_TABLET | Freq: Two times a day (BID) | ORAL | Status: DC

## 2020-08-03 MED ORDER — LEVALBUTEROL HCL 0.63 MG/3ML IN NEBU
0.6300 mg | INHALATION_SOLUTION | Freq: Four times a day (QID) | RESPIRATORY_TRACT | Status: DC | PRN
  Administered 2020-08-04 – 2020-08-05 (×3): 0.63 mg via RESPIRATORY_TRACT
  Filled 2020-08-03 (×3): qty 3

## 2020-08-03 MED ORDER — FUROSEMIDE 40 MG PO TABS
40.0000 mg | ORAL_TABLET | Freq: Two times a day (BID) | ORAL | Status: DC
  Administered 2020-08-04 – 2020-08-07 (×6): 40 mg via ORAL
  Filled 2020-08-03 (×6): qty 1

## 2020-08-03 MED ORDER — ACETAMINOPHEN 325 MG PO TABS: 650.0000 mg | ORAL_TABLET | Freq: Four times a day (QID) | ORAL | Status: DC | PRN

## 2020-08-03 MED ORDER — OXYCODONE-ACETAMINOPHEN 5-325 MG PO TABS: 1.0000 | ORAL_TABLET | ORAL | Status: DC | PRN

## 2020-08-03 MED ORDER — DIPHENHYDRAMINE HCL 25 MG PO CAPS: 25.0000 mg | ORAL_CAPSULE | Freq: Three times a day (TID) | ORAL | Status: DC | PRN

## 2020-08-03 MED ORDER — SODIUM CHLORIDE 0.9 % IV SOLN: INTRAVENOUS | Status: DC

## 2020-08-03 MED ORDER — METHOCARBAMOL 500 MG PO TABS
750.0000 mg | ORAL_TABLET | Freq: Four times a day (QID) | ORAL | Status: DC
  Administered 2020-08-04 – 2020-08-07 (×9): 750 mg via ORAL
  Filled 2020-08-03 (×9): qty 2

## 2020-08-03 MED ORDER — LORATADINE 10 MG PO TABS
10.0000 mg | ORAL_TABLET | Freq: Every day | ORAL | Status: DC
  Administered 2020-08-04: 10 mg via ORAL
  Filled 2020-08-03: qty 1

## 2020-08-03 MED ORDER — BENZONATATE 100 MG PO CAPS
200.0000 mg | ORAL_CAPSULE | Freq: Three times a day (TID) | ORAL | Status: DC
Start: 2020-08-03 — End: 2020-08-06
  Administered 2020-08-04 (×2): 200 mg via ORAL
  Filled 2020-08-03 (×3): qty 2

## 2020-08-03 MED ORDER — POTASSIUM CHLORIDE CRYS ER 20 MEQ PO TBCR
20.0000 meq | EXTENDED_RELEASE_TABLET | Freq: Every day | ORAL | Status: DC
  Administered 2020-08-04: 20 meq via ORAL
  Filled 2020-08-03: qty 1

## 2020-08-03 MED ORDER — MONTELUKAST SODIUM 10 MG PO TABS
10.0000 mg | ORAL_TABLET | Freq: Every day | ORAL | Status: DC
  Administered 2020-08-04 – 2020-08-07 (×4): 10 mg via ORAL
  Filled 2020-08-03 (×4): qty 1

## 2020-08-03 MED ORDER — OXYCODONE HCL 5 MG PO TABS
5.0000 mg | ORAL_TABLET | ORAL | Status: DC | PRN
Start: 2020-08-03 — End: 2020-08-06
  Administered 2020-08-03 – 2020-08-04 (×4): 5 mg via ORAL
  Filled 2020-08-03 (×4): qty 1

## 2020-08-03 MED ORDER — NITROGLYCERIN 0.4 MG SL SUBL: 0.4000 mg | SUBLINGUAL_TABLET | SUBLINGUAL | Status: DC | PRN

## 2020-08-03 MED ORDER — INSULIN GLARGINE 100 UNIT/ML ~~LOC~~ SOLN
60.0000 [IU] | Freq: Every day | SUBCUTANEOUS | Status: DC
  Administered 2020-08-03 – 2020-08-04 (×2): 60 [IU] via SUBCUTANEOUS
  Filled 2020-08-03 (×4): qty 0.6

## 2020-08-03 MED ORDER — METFORMIN HCL 500 MG PO TABS
500.0000 mg | ORAL_TABLET | Freq: Every day | ORAL | Status: DC
  Administered 2020-08-04: 500 mg via ORAL
  Filled 2020-08-03 (×3): qty 1

## 2020-08-03 MED ORDER — PREDNISONE 20 MG PO TABS: 40.0000 mg | ORAL_TABLET | Freq: Every day | ORAL | Status: DC

## 2020-08-03 MED ORDER — IPRATROPIUM BROMIDE 0.02 % IN SOLN
0.5000 mg | Freq: Four times a day (QID) | RESPIRATORY_TRACT | Status: DC
  Administered 2020-08-03 – 2020-08-08 (×19): 0.5 mg via RESPIRATORY_TRACT
  Filled 2020-08-03 (×19): qty 2.5

## 2020-08-03 MED ORDER — PANTOPRAZOLE SODIUM 40 MG PO TBEC
40.0000 mg | DELAYED_RELEASE_TABLET | Freq: Every day | ORAL | Status: DC
  Administered 2020-08-04 – 2020-08-06 (×2): 40 mg via ORAL
  Filled 2020-08-03 (×2): qty 1

## 2020-08-03 MED ORDER — LORAZEPAM 1 MG PO TABS
1.0000 mg | ORAL_TABLET | Freq: Four times a day (QID) | ORAL | Status: DC | PRN
  Administered 2020-08-04: 1 mg via ORAL
  Filled 2020-08-03: qty 1

## 2020-08-03 MED ORDER — METHYLPREDNISOLONE SODIUM SUCC 40 MG IJ SOLR
40.0000 mg | Freq: Three times a day (TID) | INTRAMUSCULAR | Status: AC
  Administered 2020-08-03 – 2020-08-04 (×3): 40 mg via INTRAVENOUS
  Filled 2020-08-03 (×3): qty 1

## 2020-08-03 MED ORDER — OXYCODONE-ACETAMINOPHEN 10-325 MG PO TABS: 1.0000 | ORAL_TABLET | ORAL | Status: DC | PRN

## 2020-08-03 MED ORDER — RIFAMPIN 300 MG PO CAPS
600.0000 mg | ORAL_CAPSULE | Freq: Every day | ORAL | Status: DC
  Administered 2020-08-04 – 2020-08-07 (×4): 600 mg via ORAL
  Filled 2020-08-03 (×5): qty 2

## 2020-08-03 MED ORDER — TAMSULOSIN HCL 0.4 MG PO CAPS
0.4000 mg | ORAL_CAPSULE | Freq: Two times a day (BID) | ORAL | Status: DC
  Administered 2020-08-04: 0.4 mg via ORAL
  Filled 2020-08-03 (×2): qty 1

## 2020-08-03 MED ORDER — CITALOPRAM HYDROBROMIDE 20 MG PO TABS
20.0000 mg | ORAL_TABLET | Freq: Every day | ORAL | Status: DC
  Administered 2020-08-04 – 2020-08-06 (×3): 20 mg via ORAL
  Filled 2020-08-03 (×3): qty 1

## 2020-08-03 MED ORDER — ENOXAPARIN SODIUM 40 MG/0.4ML ~~LOC~~ SOLN
40.0000 mg | SUBCUTANEOUS | Status: DC
  Administered 2020-08-03 – 2020-08-05 (×3): 40 mg via SUBCUTANEOUS
  Filled 2020-08-03 (×3): qty 0.4

## 2020-08-03 MED ORDER — ACETAMINOPHEN 650 MG RE SUPP: 650.0000 mg | Freq: Four times a day (QID) | RECTAL | Status: DC | PRN

## 2020-08-03 MED ORDER — INSULIN ASPART 100 UNIT/ML ~~LOC~~ SOLN
0.0000 [IU] | Freq: Three times a day (TID) | SUBCUTANEOUS | Status: DC
  Administered 2020-08-04: 3 [IU] via SUBCUTANEOUS

## 2020-08-03 NOTE — ED Notes (Signed)
Called [facility] for lantus

## 2020-08-03 NOTE — Progress Notes (Signed)
Tried patient off Bipap and put on HFNC ([place]) for a minute.  Patient BS were wheezing and were more audible.  Had to place patient back on Bipap but changed mask from Large to Medium and fit seems to be better.  Explained to patient that we should let him get his other breathing treatment at 2 am and then maybe later we can try taking him off again.  Have [place] set up on counter to try when patient comes off.  Patient stated that he is on 8L at home.

## 2020-08-03 NOTE — H&P (Signed)
History and Physical    Ryan [name] ZOX:[id number] DOB: 05-Jun-1961 DOA: 07/22/2020  PCP: Emelia Loron, NP . Patient coming from: Home.  I have personally briefly reviewed patient's old medical records in [place]  Chief Complaint: Shortness of breath.  HPI: Ryan [name] is a 60 y.o. male with medical history significant of paroxysmal atrial fibrillation not on anticoagulation, barrettes esophagus, bronchitis, CAP, cavitary lesion of lung growing MAI, chronic back pain, chronic diastolic heart failure, chronic left shoulder and neck pain, collagenous vascular disease, CAD, GERD, hyperlipidemia, polycythemia, remote seizures, type 2 diabetes mellitus, former smoker, pulmonary nodule, pulmonary fibrosis, COPD on home oxygen who is coming to the emergency department due to progressively worse dyspnea for the past 2 days.  According to EMS the patient had already used bowel 8 oh his home oxygen tanks during the day before they arrived.  They treated him with NRB oxygen, 2 DuoNebs, magnesium sulfate 2 g and Solu-Medrol 125 mg IVP.  The patient was placed on BiPAP ventilation and unable to provide full information while wearing BiPAP.  He denies fever, chills, renal area, headache, sore throat, chest pain, but complains of feeling very fatigued and having epigastric/RUQ pain since earlier in the day.  He denies diarrhea, constipation, melena or hematochezia.  No dysuria, frequency or hematuria.  No polyuria, polydipsia, polyphagia or blurred vision.  ED Course: Initial vital signs were temperature 98.4 F, pulse 119, respirations 32, BP 154/122 mmHg O2 sat 92% on NRB oxygen.  The patient was started on BiPAP ventilation in the ED.  Lab work: ABG showed pH of 7.341, PCO2 54.5 and PO2 150 mmHg.  Coronavirus PCR was negative.  CBC showed a white count 9.1, hemoglobin 9.2 g/dL (baseline 9-10) and platelets 255.  Troponin was negative twice.  BNP was 74.0 pg/mL.  Lipase was normal.  CMP showed a  sodium 133 and chloride 93 mmol/L.  Glucose 263, calcium 8.0 and total bilirubin 0.2 mg/dL.  Albumin was 2.9 g/dL.  The rest of the CMP values were normal.  Imaging: A one-view portable chest radiograph showed chronic scarring in the left upper lobe and bases, but no acute infiltrate was seen.  Please see image and full radiology report for further detail.  Review of Systems: As per HPI otherwise all other systems reviewed and are negative.  Past Medical History:  Diagnosis Date  . Atrial fibrillation ([place])    Not anticoagulated  . Barrett's esophagus   . Borderline diabetes   . Bronchitis   . CAP (community acquired pneumonia) 07/10/2013   04/2015  . 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 Chronic back pain   . Chronic diastolic CHF (congestive heart failure) ([place])   . Chronic left shoulder pain   . Chronic neck pain   . Chronic respiratory failure ([place])   . Collagen vascular disease ([place])   . COPD (chronic obstructive pulmonary disease) ([place])   . Coronary atherosclerosis of native coronary artery    Mild nonobstructive disease at catheterization 2007  . DDD (degenerative disc disease)    Cervical and thoracic  . Diastolic heart failure ([place])   . GERD (gastroesophageal reflux disease)   . History of pneumonia   . Hypercholesteremia   . On home O2    6L N/C  . Polycythemia   . Pulmonary fibrosis ([place]) 05/10/2015   For f/u HRCT 11/01/14    . Pulmonary nodule    F/u CT ordered for 11/01/15    .  Seizures ([place])    Last seizure 2 yrs ago  . Smoker   . Type 2 diabetes mellitus ([place])   . Type 2 diabetes mellitus without complication [facility])    Past Surgical History:  Procedure Laterality Date  . COLONOSCOPY  2012   Dr. Posey Pronto: normal  . ESOPHAGOGASTRODUODENOSCOPY N/A 05/04/2015   Dr. Michail Sermon: minimal erosive esophagitis, small hiatal hernia. FOOD PRECLUDED A COMPLETE EXAM. No biopsies taken  . ESOPHAGOGASTRODUODENOSCOPY  2011   [place]:  Barrett's   . ESOPHAGOGASTRODUODENOSCOPY (EGD) WITH PROPOFOL N/A 05/06/2018   Dr. Gala Romney: erosive reflux esophagitis and +candida, Schatzki's ring s/p dilation, hiatal hernia.  [name] LUNG BIOPSY    . MALONEY DILATION N/A 05/06/2018   Procedure: Venia Minks DILATION;  Surgeon: Daneil Dolin, MD;  Location: [facility]O SUITE;  Service: Endoscopy;  Laterality: N/A;  . Throat biopsy    . VASECTOMY    . VASECTOMY  70   Social History  reports that he quit smoking about 5 years ago. His smoking use included cigarettes. He has a 22.50 pack-year smoking history. He has never used smokeless tobacco. He reports that he does not drink alcohol and does not use drugs.  Allergies  Allergen Reactions  . Albuterol Palpitations    Causes pt to have a-fib   . 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 Influenza Vaccine Live Swelling    Family History  Problem Relation Age of Onset  . Hypertension Mother   . Diabetes Mother   . Heart attack Mother   . Heart failure Mother   . Hypertension Sister   . Diabetes Sister   . Heart failure Sister   . Colon cancer Neg Hx    Prior to Admission medications   Medication Sig Start Date End Date Taking? Authorizing Provider  alfuzosin (UROXATRAL) 10 MG 24 hr tablet Take 10 mg by mouth daily with breakfast.   Yes [provider]  AMBULATORY NON FORMULARY MEDICATION Take 100 mg by mouth daily. Medication Name: clofazimine Patient taking differently: Take 200 mg by mouth daily. Medication Name: clofazimine 06/18/20  Yes Kuppelweiser, Cassie L, RPH-CPP  azithromycin (ZITHROMAX) 250 MG tablet Take 250 mg by mouth as directed. 08/02/20  Yes [provider]  budesonide (PULMICORT) 0.25 MG/2ML nebulizer solution Take 2 mLs (0.25 mg total) by nebulization 2 (two) times daily. Patient taking differently: Take 0.25 mg by nebulization 4 (four) times daily. 10/24/17  Yes [name], MD   citalopram (CELEXA) 20 MG tablet Take 1 tablet (20 mg total) by mouth daily. 05/30/19  Yes [name], MD  diltiazem [facility]) 180 MG 24 hr capsule Take 180 mg by mouth daily.   Yes [provider]  diphenhydrAMINE (BENADRYL) 25 mg capsule Take 25 mg by mouth every 8 (eight) hours as needed. Felt mouth swelling at ED visit 08/16/18.   Yes [provider]  fexofenadine (ALLEGRA) 180 MG tablet Take 180 mg by mouth daily.   Yes [provider]  fluticasone (FLONASE) 50 MCG/ACT nasal spray Place 2 sprays into both nostrils daily as needed.  02/24/18  Yes [provider]  Fluticasone-Umeclidin-Vilant 100-62.5-25 MCG/INH AEPB Inhale 1 puff into the lungs daily.   Yes [provider]  furosemide (LASIX) 40 MG tablet Take 1 tablet (40 mg total) by mouth 2 (two) times daily. As needed for swelling 05/29/19 05/28/20 Yes [name], MD  guaiFENesin (MUCINEX) 600 MG 12 hr tablet Take 600 mg by  mouth 2 (two) times daily.   Yes [provider]  HYDROcodone-homatropine (HYCODAN) 5-1.5 MG/5ML syrup Take 5 mLs by mouth every 6 (six) hours as needed for cough.   Yes [provider]  insulin glargine (LANTUS) 100 UNIT/ML injection Inject 0.6 mLs (60 Units total) into the skin daily. 24 units daily 05/29/19  Yes [name], MD  insulin lispro (HUMALOG) 100 UNIT/ML injection Take by sliding scale provided by hospital maximum amount daily is 60 units.  Check blood sugar 4 times a day Patient taking differently: Inject 0-15 Units into the skin 3 (three) times daily with meals. Take by sliding scale provided by hospital maximum amount daily is 60 units.  Check blood sugar 4 times a day 09/23/17  Yes [name], MD  ipratropium (ATROVENT) 0.02 % nebulizer solution Take 0.5 mg by nebulization every 6 (six) hours as needed.  09/29/17  Yes [provider]  levalbuterol Penne Lash) 0.63 MG/3ML nebulizer solution Take 0.63 mg by nebulization every  6 (six) hours as needed.    Yes [provider]  LORazepam (ATIVAN) 1 MG tablet Take 1 mg by mouth 4 (four) times daily as needed. 07/19/20  Yes [provider]  metFORMIN (GLUCOPHAGE) 500 MG tablet Take 1 tablet (500 mg total) by mouth daily with breakfast. 09/15/14  Yes Rexene Alberts, MD  methocarbamol (ROBAXIN) 750 MG tablet Take 750 mg by mouth 4 (four) times daily. 07/10/20  Yes [provider]  montelukast (SINGULAIR) 10 MG tablet Take 1 tablet by mouth daily. 02/17/19  Yes [provider]  nitroGLYCERIN (NITROSTAT) 0.4 MG SL tablet Place 1 tablet (0.4 mg total) under the tongue every 5 (five) minutes as needed for chest pain. 04/22/13  Yes Lendon Colonel, NP  omeprazole (PRILOSEC) 20 MG capsule TAKE 1 CAPSULE BY MOUTH 2 TIMES DAILY BEFORE A MEAL 09/28/19  Yes Mahala Menghini, PA-C  oxyCODONE-acetaminophen (PERCOCET) 10-325 MG tablet Take 1 tablet by mouth every 4 (four) hours as needed for pain.   Yes [provider]  potassium chloride SA (KLOR-CON) 20 MEQ tablet Take 1 tablet once daily while on Lasix 08/14/19  Yes Emokpae, Courage, MD  predniSONE (DELTASONE) 10 MG tablet Take 1 tablet (10 mg total) by mouth See admin instructions. Take 40 mg with breakfast daily for 4 days then 30 mg daily for 4 days then 20 mg daily for 4 days 10 mg daily indefinitely-always take with food 08/14/19  Yes Emokpae, Courage, MD  rifampin (RIFADIN) 300 MG capsule Take 600 mg by mouth daily. 07/16/20  Yes [provider]  tamsulosin (FLOMAX) 0.4 MG CAPS capsule Take 1 capsule (0.4 mg total) by mouth 2 (two) times daily. 09/19/18  Yes Roxan Hockey, MD  acetaminophen (TYLENOL) 325 MG tablet Take 2 tablets (650 mg total) by mouth every 6 (six) hours as needed for mild pain, moderate pain, fever or headache (or Fever >/= 101). Patient not taking: Reported on 08/13/2020 09/19/18   Roxan Hockey, MD  BD PEN NEEDLE NANO 2ND GEN 32G X 4 MM MISC INJECT UP TO 5 TIMES DAILY  06/29/19   [provider]  benzonatate (TESSALON) 200 MG capsule Take 200 mg by mouth 3 (three) times daily.    [provider]  CARTIA XT 120 MG 24 hr capsule Take 1 capsule (120 mg total) by mouth daily. 01/01/18   Arnoldo Lenis, MD  LORazepam (ATIVAN) 0.5 MG tablet Take 1 tablet (0.5 mg total) by mouth 2 (two) times daily as  needed for anxiety or sleep. Patient not taking: Reported on 08/07/2020 05/04/15   Cristal Ford, DO  oxymetazoline (AFRIN) 0.05 % nasal spray Place 1 spray into both nostrils 2 (two) times daily. Patient not taking: Reported on 08/06/2020    [provider]  rosuvastatin (CRESTOR) 10 MG tablet Take 10 mg by mouth every morning.  Patient not taking: Reported on 07/29/2020 04/10/18   [provider]  SYMBICORT 160-4.5 MCG/ACT inhaler INL 2 PFS PO BID Patient not taking: Reported on 07/15/2020 09/23/18   [provider]    Physical Exam: Vitals:   07/19/2020 2130 07/22/2020 2200 08/07/2020 2230 08/13/2020 2232  BP: 129/87 131/80 (!) 133/92   Pulse: 86 83 88 95  Resp:    (!) 24  Temp:      TempSrc:      SpO2: 100% 100% 96% 99%  Weight:      Height:        Constitutional: Looks chronically ill, but in NAD, calm, comfortable Eyes: PERRL, lids and conjunctivae normal ENMT: BiPAP mask on.  Mucous membranes are dry. Posterior pharynx clear of any exudate or lesions. Neck: normal, supple, no masses, no thyromegaly Respiratory: Mildly tachypneic with decreased breath sounds, bilateral wheezing.  No crackles. No accessory muscle use.  Cardiovascular: Regular rate and rhythm, no murmurs / rubs / gallops. No extremity edema. 2+ pedal pulses. No carotid bruits.  Abdomen: Obese, no distention.  Bowel sounds positive.  No tenderness, no masses palpated. No hepatosplenomegaly. Musculoskeletal: Generalized weakness.  No clubbing / cyanosis.  Good ROM, no contractures. Normal muscle tone.  Skin: no rashes, lesions, ulcers on limited  dermatological examination. Neurologic: CN 2-12 grossly intact. Sensation intact, DTR normal. Strength 5/5 in all 4.  Psychiatric: Normal judgment and insight. Alert and oriented x 3.  Mildly anxious mood.   Labs on Admission: I have personally reviewed following labs and imaging studies  CBC: Recent Labs  Lab 07/19/2020 1831  WBC 9.1  NEUTROABS 8.2*  HGB 9.2*  HCT 30.1*  MCV 80.9  PLT 330    Basic Metabolic Panel: Recent Labs  Lab 07/31/2020 1831  NA 133*  K 4.4  CL 93*  CO2 28  GLUCOSE 263*  BUN 15  CREATININE 1.01  CALCIUM 8.0*    GFR: Estimated Creatinine Clearance: 74.3 mL/min (by C-G formula based on SCr of 1.01 mg/dL).  Liver Function Tests: Recent Labs  Lab 07/30/2020 1831  AST 18  ALT 19  ALKPHOS 48  BILITOT 0.2*  PROT 6.6  ALBUMIN 2.9*    Radiological Exams on Admission: DG Chest Port 1 View  Result Date: 07/17/2020 CLINICAL DATA:  Shortness of breath for 1 day EXAM: PORTABLE CHEST 1 VIEW COMPARISON:  02/17/2020 FINDINGS: Cardiac shadow is stable. Chronic scarring in the left mid and upper lung is seen and improved. No focal infiltrate or sizable effusion is seen. Mild scarring is noted in the bases right greater than left stable from the prior study. No acute bony abnormality is seen. IMPRESSION: Chronic scarring in the left upper lobe but improved from the prior exam. Bibasilar scarring is noted right greater than left. No acute infiltrate is seen. Electronically Signed   By: Inez Catalina M.D.   On: 08/10/2020 17:43    EKG: Independently reviewed.  Vent. rate 121 BPM PR interval * ms QRS duration 87 ms QT/QTc 312/443 ms P-R-T axes 102 69 79 Sinus tachycardia RSR' in V1 or V2, right VCD or RVH  Assessment/Plan Principal Problem:  Acute on chronic respiratory failure with hypoxia and hypercapnia (HCC) Observation/stepdown. Continue supplemental oxygen. I-S and FV. Continue BiPAP ventilation. Xopenex neb treatments. Continue Solu-Medrol  followed by prednisone taper.  Active Problems:   PAF (paroxysmal atrial fibrillation) (HCC) CHA?DS?-VASc Score of at least 2. Not on anticoagulation. Declined reinitiation. Continue diltiazem 120 mg p.o. daily.    Hypochromic anemia Hemoglobin at baseline. Monitor hematocrit and hemoglobin.    Chronic diastolic CHF (congestive heart failure) (HCC) No signs of volume overload. Edema likely due to hypoalbuminemia.    GERD (gastroesophageal reflux disease) Continue daily PPI.    Moderate protein malnutrition ([place]) Consider nutritional services evaluation.    Hyperlipidemia Not on statin.    DVT prophylaxis: Lovenox SQ. Code Status:   Full code. Family Communication: Disposition Plan:   Patient is from:  Home.  Anticipated DC to:  Home.  Anticipated DC date:  08/05/2020.  Anticipated DC barriers: Clinical status.  Consults called: Admission status:  Observation/telemetry.   Severity of Illness: High due to acute on chronic respiratory failure secondary to COPD/pulmonary fibrosis exacerbation requiring increased supplemental oxygen, bronchodilators, parenteral glucocorticoids and BiPAP ventilation mode.  The patient will need to remain in the hospital for 24 to 48 hours given his new oxygen requirement and depletion of his home oxygen supply.  Reubin Milan MD [facility]  How to contact the [facility] Attending or Consulting provider [place] or covering provider during after hours [place], for this patient?   1. Check the care team in [facility] and look for a) attending/consulting [facility] provider listed and b) the [facility] team listed 2. Log into [email or web address] and use [place]'s universal password to access. If you do not have the password, please contact the hospital operator. 3. Locate the [facility] provider you are looking for under [facility] and page to a number that you can be directly reached. 4. If you still have difficulty reaching the provider, please page the [facility]  (Director on Call) for the Hospitalists listed on amion for assistance.  07/19/2020, 11:45 PM   This document was prepared using Dragon voice recognition software may contain some unintended transcription errors.

## 2020-08-03 NOTE — ED Provider Notes (Signed)
[facility] EMERGENCY DEPARTMENT Provider Note  CSN: [id number] Arrival date & time: 08/12/2020 1713    History Chief Complaint  Patient presents with  . Shortness of Breath    HPI  Ryan [name] is a 60 y.o. male with history of COPD, CHF, PAF brought to the ED for evaluation of progressively worsening SOB since earlier today, associated with cough. He was doing nebs and supplemental oxygen at home with initial SpO2 in the 70s. Reportedly ran through 8 oxygen tanks at home. He was given steroids, duoneb x 2 and magnesium by EMS and placed on NRB at 10L with some improvement. He has not had a fever. Has not had Covid vaccine.    Patient also complaining of epigastric/RUQ pain onset today. No reported vomiting.    Past Medical History:  Diagnosis Date  . Atrial fibrillation ([place])    Not anticoagulated  . Barrett's esophagus   . Borderline diabetes   . Bronchitis   . CAP (community acquired pneumonia) 07/10/2013   04/2015  . 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 Chronic back pain   . Chronic diastolic CHF (congestive heart failure) ([place])   . Chronic left shoulder pain   . Chronic neck pain   . Chronic respiratory failure ([place])   . Collagen vascular disease ([place])   . COPD (chronic obstructive pulmonary disease) ([place])   . Coronary atherosclerosis of native coronary artery    Mild nonobstructive disease at catheterization 2007  . DDD (degenerative disc disease)    Cervical and thoracic  . Diastolic heart failure ([place])   . GERD (gastroesophageal reflux disease)   . History of pneumonia   . Hypercholesteremia   . On home O2    6L N/C  . Polycythemia   . Seizures ([place])    Last seizure 2 yrs ago  . Smoker   . Type 2 diabetes mellitus ([place])   . Type 2 diabetes mellitus without complication [facility])     Past Surgical History:  Procedure Laterality Date  . COLONOSCOPY  2012   Dr. Posey Pronto: normal  . ESOPHAGOGASTRODUODENOSCOPY N/A  05/04/2015   Dr. Michail Sermon: minimal erosive esophagitis, small hiatal hernia. FOOD PRECLUDED A COMPLETE EXAM. No biopsies taken  . ESOPHAGOGASTRODUODENOSCOPY  2011   [place]: Barrett's   . ESOPHAGOGASTRODUODENOSCOPY (EGD) WITH PROPOFOL N/A 05/06/2018   Dr. Gala Romney: erosive reflux esophagitis and +candida, Schatzki's ring s/p dilation, hiatal hernia.  [name] LUNG BIOPSY    . MALONEY DILATION N/A 05/06/2018   Procedure: Venia Minks DILATION;  Surgeon: Daneil Dolin, MD;  Location: [facility] ENDO SUITE;  Service: Endoscopy;  Laterality: N/A;  . Throat biopsy    . VASECTOMY    . VASECTOMY  1987    Family History  Problem Relation Age of Onset  . Hypertension Mother   . Diabetes Mother   . Heart attack Mother   . Heart failure Mother   . Hypertension Sister   . Diabetes Sister   . Heart failure Sister   . Colon cancer Neg Hx     Social History   Tobacco Use  . Smoking status: Former Smoker    Packs/day: 0.50    Years: 45.00    Pack years: 22.50    Types: Cigarettes    Quit date: 05/01/2015    Years since quitting: 5.2  . Smokeless tobacco: Never Used  Vaping Use  . Vaping Use: Never used  Substance Use Topics  . Alcohol use: No  Alcohol/week: 0.0 standard drinks  . Drug use: No     Home Medications Prior to Admission medications   Medication Sig Start Date End Date Taking? Authorizing Provider  acetaminophen (TYLENOL) 325 MG tablet Take 2 tablets (650 mg total) by mouth every 6 (six) hours as needed for mild pain, moderate pain, fever or headache (or Fever >/= 101). 09/19/18   Roxan Hockey, MD  alfuzosin (UROXATRAL) 10 MG 24 hr tablet Take 10 mg by mouth daily with breakfast.    [provider]  AMBULATORY NON FORMULARY MEDICATION Take 100 mg by mouth daily. Medication Name: clofazimine 06/18/20   Kuppelweiser, Cassie L, RPH-CPP  BD PEN NEEDLE NANO 2ND GEN 32G X 4 MM MISC INJECT UP TO 5 TIMES DAILY 06/29/19   [provider]  benzonatate (TESSALON) 200 MG capsule  Take 200 mg by mouth 3 (three) times daily.    [provider]  budesonide (PULMICORT) 0.25 MG/2ML nebulizer solution Take 2 mLs (0.25 mg total) by nebulization 2 (two) times daily. Patient taking differently: Take 0.25 mg by nebulization 4 (four) times daily.  10/24/17   [name], MD  CARTIA XT 120 MG 24 hr capsule Take 1 capsule (120 mg total) by mouth daily. 01/01/18   Arnoldo Lenis, MD  citalopram (CELEXA) 20 MG tablet Take 1 tablet (20 mg total) by mouth daily. 05/30/19   [name], MD  diphenhydrAMINE (BENADRYL) 25 mg capsule Take 25 mg by mouth every 8 (eight) hours as needed. Felt mouth swelling at ED visit 08/16/18.    [provider]  fexofenadine (ALLEGRA) 180 MG tablet Take 180 mg by mouth daily.    [provider]  fluticasone (FLONASE) 50 MCG/ACT nasal spray Place 2 sprays into both nostrils daily as needed.  02/24/18   [provider]  Fluticasone-Umeclidin-Vilant (TRELEGY ELLIPTA) 100-62.5-25 MCG/INH AEPB Inhale 1 puff into the lungs daily.    [provider]  furosemide (LASIX) 40 MG tablet Take 1 tablet (40 mg total) by mouth 2 (two) times daily. As needed for swelling 05/29/19 05/28/20  [name], MD  guaiFENesin (MUCINEX) 600 MG 12 hr tablet Take 600 mg by mouth 2 (two) times daily.    [provider]  HYDROcodone-homatropine (HYCODAN) 5-1.5 MG/5ML syrup Take 5 mLs by mouth every 6 (six) hours as needed for cough.    [provider]  insulin glargine (LANTUS) 100 UNIT/ML injection Inject 0.6 mLs (60 Units total) into the skin daily. 24 units daily Patient taking differently: Inject 58 Units into the skin daily. 24 units daily 05/29/19   [name], MD  insulin lispro (HUMALOG) 100 UNIT/ML injection Take by sliding scale provided by hospital maximum amount daily is 60 units.  Check blood sugar 4 times a day Patient taking differently: Inject 0-15 Units into the skin 3 (three) times daily with  meals. Take by sliding scale provided by hospital maximum amount daily is 60 units.  Check blood sugar 4 times a day 09/23/17   [name], MD  ipratropium (ATROVENT) 0.02 % nebulizer solution Take 0.5 mg by nebulization every 6 (six) hours as needed.  09/29/17   [provider]  levalbuterol Penne Lash) 0.63 MG/3ML nebulizer solution Take 0.63 mg by nebulization every 6 (six) hours as needed.     [provider]  LORazepam (ATIVAN) 0.5 MG tablet Take 1 tablet (0.5 mg total) by mouth 2 (two) times daily as needed for anxiety or sleep. Patient taking differently: Take 0.5 mg by mouth 4 (  four) times daily.  05/04/15   Cristal Ford, DO  metFORMIN (GLUCOPHAGE) 500 MG tablet Take 1 tablet (500 mg total) by mouth daily with breakfast. 09/15/14   Rexene Alberts, MD  montelukast (SINGULAIR) 10 MG tablet Take 1 tablet by mouth daily. 02/17/19   [provider]  nitroGLYCERIN (NITROSTAT) 0.4 MG SL tablet Place 1 tablet (0.4 mg total) under the tongue every 5 (five) minutes as needed for chest pain. 04/22/13   Lendon Colonel, NP  omeprazole (PRILOSEC) 20 MG capsule TAKE 1 CAPSULE BY MOUTH 2 TIMES DAILY BEFORE A MEAL 09/28/19   Mahala Menghini, PA-C  oxyCODONE-acetaminophen (PERCOCET) 10-325 MG tablet Take 1 tablet by mouth every 4 (four) hours as needed for pain.    [provider]  oxymetazoline (AFRIN) 0.05 % nasal spray Place 1 spray into both nostrils 2 (two) times daily.    [provider]  potassium chloride SA (KLOR-CON) 20 MEQ tablet Take 1 tablet once daily while on Lasix 08/14/19   Emokpae, Courage, MD  predniSONE (DELTASONE) 10 MG tablet Take 1 tablet (10 mg total) by mouth See admin instructions. Take 40 mg with breakfast daily for 4 days then 30 mg daily for 4 days then 20 mg daily for 4 days 10 mg daily indefinitely-always take with food 08/14/19   Roxan Hockey, MD  rosuvastatin (CRESTOR) 10 MG tablet Take 10 mg by mouth every morning.  04/10/18    [provider]  SYMBICORT 160-4.5 MCG/ACT inhaler INL 2 PFS PO BID 09/23/18   [provider]  tamsulosin (FLOMAX) 0.4 MG CAPS capsule Take 1 capsule (0.4 mg total) by mouth 2 (two) times daily. 09/19/18   Roxan Hockey, MD     Allergies    Albuterol, Ciprofloxacin, and Influenza vaccine live   Review of Systems   Review of Systems A comprehensive review of systems was completed and negative except as noted in HPI.    Physical Exam BP 111/73   Pulse 94   Temp 98.4 F (36.9 C) (Oral)   Resp (!) 24   Ht 5\' 8"  (1.727 m)   Wt 79.4 kg   SpO2 99%   BMI 26.61 kg/m   Physical Exam Vitals and nursing note reviewed.  Constitutional:      Appearance: Normal appearance.  HENT:     Head: Normocephalic and atraumatic.     Nose: Nose normal.     Mouth/Throat:     Mouth: Mucous membranes are moist.  Eyes:     Extraocular Movements: Extraocular movements intact.     Conjunctiva/sclera: Conjunctivae normal.  Cardiovascular:     Rate and Rhythm: Normal rate.  Pulmonary:     Effort: Accessory muscle usage and respiratory distress present.     Breath sounds: Decreased breath sounds and wheezing present.  Abdominal:     General: Abdomen is flat.     Palpations: Abdomen is soft.     Tenderness: There is abdominal tenderness (epigastric). There is no guarding.     Comments: Easily reducible umbilical hernia  Musculoskeletal:        General: No swelling. Normal range of motion.     Cervical back: Neck supple.     Right lower leg: Edema present.     Left lower leg: Edema present.  Skin:    General: Skin is warm and dry.  Neurological:     General: No focal deficit present.     Mental Status: He is alert.  Psychiatric:  Mood and Affect: Mood normal.      ED Results / Procedures / Treatments   Labs (all labs ordered are listed, but only abnormal results are displayed) Labs Reviewed  COMPREHENSIVE METABOLIC PANEL - Abnormal; Notable for the following  components:      Result Value   Sodium 133 (*)    Chloride 93 (*)    Glucose, Bld 263 (*)    Calcium 8.0 (*)    Albumin 2.9 (*)    Total Bilirubin 0.2 (*)    All other components within normal limits  CBC WITH DIFFERENTIAL/PLATELET - Abnormal; Notable for the following components:   RBC 3.72 (*)    Hemoglobin 9.2 (*)    HCT 30.1 (*)    MCH 24.7 (*)    RDW 16.6 (*)    Neutro Abs 8.2 (*)    Lymphs Abs 0.3 (*)    All other components within normal limits  BLOOD GAS, ARTERIAL - Abnormal; Notable for the following components:   pH, Arterial 7.341 (*)    pCO2 arterial 54.5 (*)    pO2, Arterial 115 (*)    Acid-Base Excess 3.4 (*)    All other components within normal limits  SARS CORONAVIRUS 2 BY RT PCR (HOSPITAL ORDER, [place] [facility])  LIPASE, BLOOD  BRAIN NATRIURETIC PEPTIDE  TROPONIN I (HIGH SENSITIVITY)  TROPONIN I (HIGH SENSITIVITY)    EKG EKG Interpretation  Date/Time:  Friday August 03 2020 17:17:40 EST Ventricular Rate:  121 PR Interval:    QRS Duration: 87 QT Interval:  312 QTC Calculation: 443 R Axis:   69 Text Interpretation: Sinus tachycardia RSR' in V1 or V2, right VCD or RVH No significant change since last tracing Confirmed by Calvert Cantor [phone]) on 07/27/2020 5:32:01 PM   Radiology DG Chest Port 1 View  Result Date: 07/27/2020 CLINICAL DATA:  Shortness of breath for 1 day EXAM: PORTABLE CHEST 1 VIEW COMPARISON:  02/17/2020 FINDINGS: Cardiac shadow is stable. Chronic scarring in the left mid and upper lung is seen and improved. No focal infiltrate or sizable effusion is seen. Mild scarring is noted in the bases right greater than left stable from the prior study. No acute bony abnormality is seen. IMPRESSION: Chronic scarring in the left upper lobe but improved from the prior exam. Bibasilar scarring is noted right greater than left. No acute infiltrate is seen. Electronically Signed   By: Inez Catalina M.D.   On: 08/11/2020 17:43     Procedures .Critical Care Performed by: Truddie Hidden, MD Authorized by: Truddie Hidden, MD   Critical care provider statement:    Critical care time (minutes):  45   Critical care time was exclusive of:  Separately billable procedures and treating other patients   Critical care was necessary to treat or prevent imminent or life-threatening deterioration of the following conditions:  Respiratory failure   Critical care was time spent personally by me on the following activities:  Discussions with consultants, evaluation of patient's response to treatment, examination of patient, ordering and performing treatments and interventions, ordering and review of laboratory studies, ordering and review of radiographic studies, pulse oximetry, re-evaluation of patient's condition, obtaining history from patient or surrogate and review of old charts   Care discussed with: admitting provider      Medications Ordered in the ED Medications - No data to display   MDM Rules/Calculators/A&P MDM Patient with SOB, respiratory failure, still with hypoxia and increased WOB on NRB. Will trial  BiPAP, patient is full code and amenable to intubation if necessary. Check labs for signs of CHF or Covid.   ED Course  I have reviewed the triage vital signs and the nursing notes.  Pertinent labs & imaging results that were available during my care of the patient were reviewed by me and considered in my medical decision making (see chart for details).  Clinical Course as of 07/27/2020 2028  Fri Aug 03, 2020  1750 CXR with old scar, no acute infiltrates or edema. [CS]  1813 WOB and hypoxia improved on BiPAP.  [CS]  4445 ABG without significant acidosis, O2 sat is normal.  [CS]  1828 Covid is neg.  [CS]  1951 CBC shows mild anemia, normal WBC.  [CS]  2017 CMP, lipase and Trop are unremarkable. Patient resting comfortably now. Will discuss admission with Hospitalist.  [CS]  2027 Spoke with Dr. Olevia Bowens,  Hospitalist, who will evaluate for admission.  [CS]    Clinical Course User Index [CS] Truddie Hidden, MD    Final Clinical Impression(s) / ED Diagnoses Final diagnoses:  COPD exacerbation ([place])  Acute respiratory failure with hypoxia [facility])    Rx / DC Orders ED Discharge Orders    None       Truddie Hidden, MD 08/04/2020 2019

## 2020-08-03 NOTE — ED Triage Notes (Signed)
Pt presents to ED via [facility] for increased SOB since 0900. Pt has had 2 duonebs, 2 g Mag PTA, Solumedrol 125 mg PTA. Pt with hx end stage COPD. Per EMS, pt has went through 8 home 02 tanks today.

## 2020-08-04 ENCOUNTER — Inpatient Hospital Stay (HOSPITAL_COMMUNITY): Payer: Medicare Other

## 2020-08-04 DIAGNOSIS — F419 Anxiety disorder, unspecified: Secondary | ICD-10-CM | POA: Diagnosis present

## 2020-08-04 DIAGNOSIS — M549 Dorsalgia, unspecified: Secondary | ICD-10-CM | POA: Diagnosis present

## 2020-08-04 DIAGNOSIS — Z9981 Dependence on supplemental oxygen: Secondary | ICD-10-CM | POA: Diagnosis not present

## 2020-08-04 DIAGNOSIS — E119 Type 2 diabetes mellitus without complications: Secondary | ICD-10-CM | POA: Diagnosis present

## 2020-08-04 DIAGNOSIS — R911 Solitary pulmonary nodule: Secondary | ICD-10-CM | POA: Diagnosis present

## 2020-08-04 DIAGNOSIS — Z20822 Contact with and (suspected) exposure to covid-19: Secondary | ICD-10-CM | POA: Diagnosis present

## 2020-08-04 DIAGNOSIS — Z66 Do not resuscitate: Secondary | ICD-10-CM | POA: Diagnosis not present

## 2020-08-04 DIAGNOSIS — M47816 Spondylosis without myelopathy or radiculopathy, lumbar region: Secondary | ICD-10-CM | POA: Diagnosis not present

## 2020-08-04 DIAGNOSIS — E440 Moderate protein-calorie malnutrition: Secondary | ICD-10-CM | POA: Diagnosis present

## 2020-08-04 DIAGNOSIS — J449 Chronic obstructive pulmonary disease, unspecified: Secondary | ICD-10-CM | POA: Diagnosis not present

## 2020-08-04 DIAGNOSIS — K2100 Gastro-esophageal reflux disease with esophagitis, without bleeding: Secondary | ICD-10-CM

## 2020-08-04 DIAGNOSIS — R1110 Vomiting, unspecified: Secondary | ICD-10-CM | POA: Diagnosis not present

## 2020-08-04 DIAGNOSIS — J9621 Acute and chronic respiratory failure with hypoxia: Secondary | ICD-10-CM

## 2020-08-04 DIAGNOSIS — Z87891 Personal history of nicotine dependence: Secondary | ICD-10-CM | POA: Diagnosis not present

## 2020-08-04 DIAGNOSIS — Z515 Encounter for palliative care: Secondary | ICD-10-CM | POA: Diagnosis not present

## 2020-08-04 DIAGNOSIS — I5032 Chronic diastolic (congestive) heart failure: Secondary | ICD-10-CM

## 2020-08-04 DIAGNOSIS — K3189 Other diseases of stomach and duodenum: Secondary | ICD-10-CM | POA: Diagnosis not present

## 2020-08-04 DIAGNOSIS — G8929 Other chronic pain: Secondary | ICD-10-CM | POA: Diagnosis present

## 2020-08-04 DIAGNOSIS — J441 Chronic obstructive pulmonary disease with (acute) exacerbation: Principal | ICD-10-CM

## 2020-08-04 DIAGNOSIS — K219 Gastro-esophageal reflux disease without esophagitis: Secondary | ICD-10-CM | POA: Diagnosis present

## 2020-08-04 DIAGNOSIS — J439 Emphysema, unspecified: Secondary | ICD-10-CM | POA: Diagnosis not present

## 2020-08-04 DIAGNOSIS — R140 Abdominal distension (gaseous): Secondary | ICD-10-CM | POA: Diagnosis not present

## 2020-08-04 DIAGNOSIS — N400 Enlarged prostate without lower urinary tract symptoms: Secondary | ICD-10-CM | POA: Diagnosis present

## 2020-08-04 DIAGNOSIS — I480 Paroxysmal atrial fibrillation: Secondary | ICD-10-CM

## 2020-08-04 DIAGNOSIS — J9622 Acute and chronic respiratory failure with hypercapnia: Secondary | ICD-10-CM | POA: Diagnosis not present

## 2020-08-04 DIAGNOSIS — J690 Pneumonitis due to inhalation of food and vomit: Secondary | ICD-10-CM | POA: Diagnosis present

## 2020-08-04 DIAGNOSIS — Z888 Allergy status to other drugs, medicaments and biological substances status: Secondary | ICD-10-CM | POA: Diagnosis not present

## 2020-08-04 DIAGNOSIS — Z8249 Family history of ischemic heart disease and other diseases of the circulatory system: Secondary | ICD-10-CM | POA: Diagnosis not present

## 2020-08-04 DIAGNOSIS — D751 Secondary polycythemia: Secondary | ICD-10-CM | POA: Diagnosis present

## 2020-08-04 DIAGNOSIS — Z881 Allergy status to other antibiotic agents status: Secondary | ICD-10-CM | POA: Diagnosis not present

## 2020-08-04 DIAGNOSIS — E7800 Pure hypercholesterolemia, unspecified: Secondary | ICD-10-CM | POA: Diagnosis present

## 2020-08-04 DIAGNOSIS — R0603 Acute respiratory distress: Secondary | ICD-10-CM | POA: Diagnosis not present

## 2020-08-04 DIAGNOSIS — Z7189 Other specified counseling: Secondary | ICD-10-CM | POA: Diagnosis not present

## 2020-08-04 DIAGNOSIS — R0600 Dyspnea, unspecified: Secondary | ICD-10-CM | POA: Diagnosis not present

## 2020-08-04 LAB — BLOOD GAS, ARTERIAL
Acid-Base Excess: 7.9 mmol/L — ABNORMAL HIGH (ref 0.0–2.0)
Bicarbonate: 30.1 mmol/L — ABNORMAL HIGH (ref 20.0–28.0)
FIO2: 100
O2 Saturation: 99.3 %
Patient temperature: 37
pCO2 arterial: 64.5 mmHg — ABNORMAL HIGH (ref 32.0–48.0)
pH, Arterial: 7.338 — ABNORMAL LOW (ref 7.350–7.450)
pO2, Arterial: 250 mmHg — ABNORMAL HIGH (ref 83.0–108.0)

## 2020-08-04 LAB — GLUCOSE, CAPILLARY
Glucose-Capillary: 121 mg/dL — ABNORMAL HIGH (ref 70–99)
Glucose-Capillary: 72 mg/dL (ref 70–99)
Glucose-Capillary: 90 mg/dL (ref 70–99)

## 2020-08-04 LAB — CBG MONITORING, ED
Glucose-Capillary: 100 mg/dL — ABNORMAL HIGH (ref 70–99)
Glucose-Capillary: 62 mg/dL — ABNORMAL LOW (ref 70–99)
Glucose-Capillary: 76 mg/dL (ref 70–99)

## 2020-08-04 LAB — HEMOGLOBIN A1C
Hgb A1c MFr Bld: 9.4 % — ABNORMAL HIGH (ref 4.8–5.6)
Mean Plasma Glucose: 223.08 mg/dL

## 2020-08-04 MED ORDER — FENTANYL 2500MCG IN NS 250ML (10MCG/ML) PREMIX INFUSION
50.0000 ug/h | INTRAVENOUS | Status: DC
  Administered 2020-08-04: 50 ug/h via INTRAVENOUS
  Administered 2020-08-05: 200 ug/h via INTRAVENOUS
  Administered 2020-08-05 – 2020-08-06 (×2): 150 ug/h via INTRAVENOUS
  Filled 2020-08-04 (×4): qty 250

## 2020-08-04 MED ORDER — LEVALBUTEROL HCL 1.25 MG/0.5ML IN NEBU
1.2500 mg | INHALATION_SOLUTION | Freq: Four times a day (QID) | RESPIRATORY_TRACT | Status: DC
  Administered 2020-08-04 – 2020-08-08 (×16): 1.25 mg via RESPIRATORY_TRACT
  Filled 2020-08-04 (×16): qty 0.5

## 2020-08-04 MED ORDER — ENSURE ENLIVE PO LIQD: 237.0000 mL | Freq: Three times a day (TID) | ORAL | Status: DC

## 2020-08-04 MED ORDER — ORAL CARE MOUTH RINSE
15.0000 mL | OROMUCOSAL | Status: DC
  Administered 2020-08-04 – 2020-08-08 (×30): 15 mL via OROMUCOSAL

## 2020-08-04 MED ORDER — LEVALBUTEROL HCL 1.25 MG/0.5ML IN NEBU: 1.2500 mg | INHALATION_SOLUTION | Freq: Four times a day (QID) | RESPIRATORY_TRACT | Status: DC

## 2020-08-04 MED ORDER — BUDESONIDE 0.25 MG/2ML IN SUSP
0.2500 mg | Freq: Two times a day (BID) | RESPIRATORY_TRACT | Status: DC
  Administered 2020-08-04 – 2020-08-08 (×9): 0.25 mg via RESPIRATORY_TRACT
  Filled 2020-08-04 (×9): qty 2

## 2020-08-04 MED ORDER — MIDAZOLAM HCL 2 MG/2ML IJ SOLN
2.0000 mg | INTRAMUSCULAR | Status: AC | PRN
  Administered 2020-08-04 – 2020-08-06 (×3): 2 mg via INTRAVENOUS
  Filled 2020-08-04 (×3): qty 2

## 2020-08-04 MED ORDER — CHLORHEXIDINE GLUCONATE CLOTH 2 % EX PADS
6.0000 | MEDICATED_PAD | Freq: Every day | CUTANEOUS | Status: DC
  Administered 2020-08-04 – 2020-08-06 (×2): 6 via TOPICAL

## 2020-08-04 MED ORDER — SODIUM CHLORIDE 0.9 % IV SOLN
250.0000 mL | INTRAVENOUS | Status: DC
  Administered 2020-08-04: 250 mL via INTRAVENOUS

## 2020-08-04 MED ORDER — LORAZEPAM 2 MG/ML IJ SOLN
1.0000 mg | Freq: Four times a day (QID) | INTRAMUSCULAR | Status: DC | PRN
  Administered 2020-08-04 – 2020-08-06 (×2): 1 mg via INTRAVENOUS
  Filled 2020-08-04 (×2): qty 1

## 2020-08-04 MED ORDER — FENTANYL BOLUS VIA INFUSION
50.0000 ug | INTRAVENOUS | Status: DC | PRN
  Administered 2020-08-04 – 2020-08-06 (×4): 50 ug via INTRAVENOUS
  Filled 2020-08-04: qty 50

## 2020-08-04 MED ORDER — INSULIN GLARGINE 100 UNIT/ML ~~LOC~~ SOLN
40.0000 [IU] | Freq: Every day | SUBCUTANEOUS | Status: DC
  Administered 2020-08-06: 40 [IU] via SUBCUTANEOUS
  Filled 2020-08-04 (×3): qty 0.4

## 2020-08-04 MED ORDER — DM-GUAIFENESIN ER 30-600 MG PO TB12
1.0000 | ORAL_TABLET | Freq: Two times a day (BID) | ORAL | Status: DC
  Administered 2020-08-04 – 2020-08-06 (×4): 1 via ORAL
  Filled 2020-08-04 (×4): qty 1

## 2020-08-04 MED ORDER — PROPOFOL 1000 MG/100ML IV EMUL
5.0000 ug/kg/min | INTRAVENOUS | Status: DC
  Administered 2020-08-04: 5 ug/kg/min via INTRAVENOUS
  Administered 2020-08-05: 40 ug/kg/min via INTRAVENOUS
  Administered 2020-08-05: 25 ug/kg/min via INTRAVENOUS
  Administered 2020-08-05: 20 ug/kg/min via INTRAVENOUS
  Administered 2020-08-06 (×2): 40 ug/kg/min via INTRAVENOUS
  Administered 2020-08-06 (×2): 30 ug/kg/min via INTRAVENOUS
  Administered 2020-08-06: 40 ug/kg/min via INTRAVENOUS
  Administered 2020-08-07 (×2): 30 ug/kg/min via INTRAVENOUS
  Filled 2020-08-04 (×11): qty 100

## 2020-08-04 MED ORDER — ADULT MULTIVITAMIN W/MINERALS CH
1.0000 | ORAL_TABLET | Freq: Every day | ORAL | Status: DC
  Administered 2020-08-05 – 2020-08-06 (×2): 1 via ORAL
  Filled 2020-08-04 (×2): qty 1

## 2020-08-04 MED ORDER — POLYETHYLENE GLYCOL 3350 17 G PO PACK
17.0000 g | PACK | Freq: Every day | ORAL | Status: DC
  Administered 2020-08-05 – 2020-08-07 (×3): 17 g
  Filled 2020-08-04 (×4): qty 1

## 2020-08-04 MED ORDER — METHYLPREDNISOLONE SODIUM SUCC 125 MG IJ SOLR
60.0000 mg | Freq: Four times a day (QID) | INTRAMUSCULAR | Status: DC
  Administered 2020-08-04 – 2020-08-07 (×11): 60 mg via INTRAVENOUS
  Filled 2020-08-04 (×11): qty 2

## 2020-08-04 MED ORDER — ALUM & MAG HYDROXIDE-SIMETH 200-200-20 MG/5ML PO SUSP
30.0000 mL | Freq: Four times a day (QID) | ORAL | Status: DC | PRN
  Administered 2020-08-04: 30 mL via ORAL
  Filled 2020-08-04: qty 30

## 2020-08-04 MED ORDER — MIDAZOLAM HCL 2 MG/2ML IJ SOLN
2.0000 mg | INTRAMUSCULAR | Status: DC | PRN
  Administered 2020-08-04 (×2): 2 mg via INTRAVENOUS
  Filled 2020-08-04 (×4): qty 2

## 2020-08-04 MED ORDER — DOCUSATE SODIUM 50 MG/5ML PO LIQD
100.0000 mg | Freq: Two times a day (BID) | ORAL | Status: DC
  Administered 2020-08-05 – 2020-08-07 (×5): 100 mg
  Filled 2020-08-04 (×5): qty 10

## 2020-08-04 MED ORDER — SODIUM CHLORIDE 0.9 % IV SOLN
1.0000 g | INTRAVENOUS | Status: DC
  Administered 2020-08-04: 1 g via INTRAVENOUS
  Filled 2020-08-04: qty 10

## 2020-08-04 MED ORDER — SODIUM CHLORIDE 0.9 % IV SOLN
3.0000 g | Freq: Four times a day (QID) | INTRAVENOUS | Status: DC
  Administered 2020-08-04 – 2020-08-06 (×7): 3 g via INTRAVENOUS
  Filled 2020-08-04 (×2): qty 8
  Filled 2020-08-04: qty 3
  Filled 2020-08-04 (×5): qty 8
  Filled 2020-08-04: qty 3
  Filled 2020-08-04: qty 8
  Filled 2020-08-04: qty 3
  Filled 2020-08-04 (×2): qty 8

## 2020-08-04 MED ORDER — HYDROCODONE-HOMATROPINE 5-1.5 MG/5ML PO SYRP: 5.0000 mL | ORAL_SOLUTION | ORAL | Status: DC | PRN

## 2020-08-04 MED ORDER — CHLORHEXIDINE GLUCONATE 0.12% ORAL RINSE (MEDLINE KIT)
15.0000 mL | Freq: Two times a day (BID) | OROMUCOSAL | Status: DC
  Administered 2020-08-04 – 2020-08-07 (×7): 15 mL via OROMUCOSAL

## 2020-08-04 MED ORDER — FENTANYL CITRATE (PF) 100 MCG/2ML IJ SOLN
50.0000 ug | Freq: Once | INTRAMUSCULAR | Status: AC
  Administered 2020-08-04: 50 ug via INTRAVENOUS
  Filled 2020-08-04: qty 2

## 2020-08-04 NOTE — ED Notes (Signed)
PT had a BM.

## 2020-08-04 NOTE — Progress Notes (Signed)
PROGRESS NOTE    Ryan [name]  DJS:[id number] DOB: 1960-10-26 DOA: 07/16/2020 PCP: Emelia Loron, NP    Brief Narrative:  60 year old male with a history of paroxysmal atrial fibrillation, cavitary lesion growing MAI, diastolic heart failure, COPD on home oxygen, presents to the emergency room with worsening shortness of breath.  Found to have COPD exacerbation.  Initially required nonrebreather and BiPAP in the emergency room.  Started on steroids, antibiotics and bronchodilators.   Assessment & Plan:   Principal Problem:   Acute on chronic respiratory failure with hypoxia and hypercapnia (HCC) Active Problems:   Hyperlipidemia   COPD exacerbation (HCC)   PAF (paroxysmal atrial fibrillation) (HCC)   Hypochromic anemia   Chronic diastolic CHF (congestive heart failure) (HCC)   GERD (gastroesophageal reflux disease)   Moderate protein malnutrition (HCC)   Acute on chronic respiratory failure with hypoxia and hypercapnia -Patient was initially placed on nonrebreather mask and subsequently BiPAP in the emergency room -He was just recently removed from BiPAP, but still appears short of breath -Chest x-ray does not show any evidence of pneumonia -Likely related to COPD exacerbation -Continue to wean down oxygen as tolerated  COPD exacerbation -Currently on intravenous steroids -Continue bronchodilators/inhaled steroids -Continue pulmonary hygiene -Started on ceftriaxone  Paroxysmal atrial fibrillation -CHA2DS2-VASc score of at least 2 -On diltiazem for rate control -He has refused anticoagulation  Chronic diastolic congestive heart failure -Currently appears compensated -Continue to follow volume status  GERD -Continue on PPI  Anxiety -Continue home dose of benzodiazepines  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  BPH -Continue on Flomax twice daily  Diabetes -Anticipate blood sugars will be higher in the setting of steroids -Continue on basal insulin  and sliding scale -Blood sugars currently stable   DVT prophylaxis: enoxaparin (LOVENOX) injection 40 mg Start: 08/02/2020 2200  Code Status: Full code Family Communication: left VM for wife on 1/22 Disposition Plan: Status is: Inpatient  Remains inpatient appropriate because:Inpatient level of care appropriate due to severity of illness   Dispo: The patient is from: Home              Anticipated d/c is to: TBD              Anticipated d/c date is: 3 days              Patient currently is not medically stable to d/c.   Difficult to place patient No         Consultants:     Procedures:     Antimicrobials:   Ceftriaxone 1/22 >   Subjective: Patient has cough.  He is producing sputum, but has difficulty bringing it up.  He is still wheezing.  Still feels short of breath.  Objective: Vitals:   08/04/20 0915 08/04/20 1000 08/04/20 1030 08/04/20 1044  BP:  124/67 129/67   Pulse:  93 (!) 105   Resp:  (!) 22 (!) 24   Temp:      TempSrc:      SpO2: 94% 98% 97% 98%  Weight:      Height:       No intake or output data in the 24 hours ending 08/04/20 1149 Filed Weights   08/06/2020 1719  Weight: 79.4 kg    Examination:  General exam: Appears calm and comfortable  Respiratory system: Bilateral wheezes with increased respiratory effort Cardiovascular system: S1 & S2 heard, RRR. No JVD, murmurs, rubs, gallops or clicks. No pedal edema. Gastrointestinal system: Abdomen is nondistended, soft  and nontender. No organomegaly or masses felt. Normal bowel sounds heard. Central nervous system: Alert and oriented. No focal neurological deficits. Extremities: Symmetric 5 x 5 power. Skin: No rashes, lesions or ulcers Psychiatry: Judgement and insight appear normal. Mood & affect appropriate.     Data Reviewed: I have personally reviewed following labs and imaging studies  CBC: Recent Labs  Lab 07/28/2020 1831  WBC 9.1  NEUTROABS 8.2*  HGB 9.2*  HCT 30.1*  MCV 80.9   PLT 332   Basic Metabolic Panel: Recent Labs  Lab 07/23/2020 1831  NA 133*  K 4.4  CL 93*  CO2 28  GLUCOSE 263*  BUN 15  CREATININE 1.01  CALCIUM 8.0*   GFR: Estimated Creatinine Clearance: 74.3 mL/min (by C-G formula based on SCr of 1.01 mg/dL). Liver Function Tests: Recent Labs  Lab 08/07/2020 1831  AST 18  ALT 19  ALKPHOS 48  BILITOT 0.2*  PROT 6.6  ALBUMIN 2.9*   Recent Labs  Lab 08/04/2020 1831  LIPASE 16   No results for input(s): AMMONIA in the last 168 hours. Coagulation Profile: No results for input(s): INR, PROTIME in the last 168 hours. Cardiac Enzymes: No results for input(s): CKTOTAL, CKMB, CKMBINDEX, TROPONINI in the last 168 hours. BNP (last 3 results) No results for input(s): PROBNP in the last 8760 hours. HbA1C: No results for input(s): HGBA1C in the last 72 hours. CBG: Recent Labs  Lab 08/02/2020 2152 08/04/20 0834  GLUCAP 248* 100*   Lipid Profile: No results for input(s): CHOL, HDL, LDLCALC, TRIG, CHOLHDL, LDLDIRECT in the last 72 hours. Thyroid Function Tests: No results for input(s): TSH, T4TOTAL, FREET4, T3FREE, THYROIDAB in the last 72 hours. Anemia Panel: No results for input(s): VITAMINB12, FOLATE, FERRITIN, TIBC, IRON, RETICCTPCT in the last 72 hours. Sepsis Labs: No results for input(s): PROCALCITON, LATICACIDVEN in the last 168 hours.  Recent Results (from the past 240 hour(s))  SARS Coronavirus 2 by RT PCR (hospital order, performed in [facility] hospital lab) Nasopharyngeal Nasopharyngeal Swab     Status: None   Collection Time: 08/06/2020  5:25 PM   Specimen: Nasopharyngeal Swab  Result Value Ref Range Status   SARS Coronavirus 2 NEGATIVE NEGATIVE Final    Comment: (NOTE) SARS-CoV-2 target nucleic acids are NOT DETECTED.  The SARS-CoV-2 RNA is generally detectable in upper and lower respiratory specimens during the acute phase of infection. The lowest concentration of SARS-CoV-2 viral copies this assay can detect is  250 copies / mL. A negative result does not preclude SARS-CoV-2 infection and should not be used as the sole basis for treatment or other patient management decisions.  A negative result may occur with improper specimen collection / handling, submission of specimen other than nasopharyngeal swab, presence of viral mutation(s) within the areas targeted by this assay, and inadequate number of viral copies (<250 copies / mL). A negative result must be combined with clinical observations, patient history, and epidemiological information.  Fact Sheet for Patients:   StrictlyIdeas.no  Fact Sheet for Healthcare Providers: BankingDealers.co.za  This test is not yet approved or  cleared by the [place] FDA and has been authorized for detection and/or diagnosis of SARS-CoV-2 by FDA under an Emergency Use Authorization (EUA).  This EUA will remain in effect (meaning this test can be used) for the duration of the COVID-19 declaration under Section 564(b)(1) of the Act, 21 U.S.C. section 360bbb-3(b)(1), unless the authorization is terminated or revoked sooner.  Performed at [facility], [address] [place],  [place] [address]          Radiology Studies: [facility] Chest Port 1 View  Result Date: 07/19/2020 CLINICAL DATA:  Shortness of breath for 1 day EXAM: PORTABLE CHEST 1 VIEW COMPARISON:  02/17/2020 FINDINGS: Cardiac shadow is stable. Chronic scarring in the left mid and upper lung is seen and improved. No focal infiltrate or sizable effusion is seen. Mild scarring is noted in the bases right greater than left stable from the prior study. No acute bony abnormality is seen. IMPRESSION: Chronic scarring in the left upper lobe but improved from the prior exam. Bibasilar scarring is noted right greater than left. No acute infiltrate is seen. Electronically Signed   By: Inez Catalina M.D.   On: 07/16/2020 17:43        Scheduled Meds: .  benzonatate  200 mg Oral TID  . budesonide (PULMICORT) nebulizer solution  0.25 mg Nebulization BID  . citalopram  20 mg Oral Daily  . dextromethorphan-guaiFENesin  1 tablet Oral BID  . diltiazem  120 mg Oral Daily  . enoxaparin (LOVENOX) injection  40 mg Subcutaneous Q24H  . furosemide  40 mg Oral BID  . insulin aspart  0-20 Units Subcutaneous TID WC  . insulin glargine  60 Units Subcutaneous Daily  . ipratropium  0.5 mg Nebulization Q6H  . levalbuterol  1.25 mg Nebulization Q6H  . loratadine  10 mg Oral Daily  . metFORMIN  500 mg Oral Q breakfast  . methocarbamol  750 mg Oral QID  . methylPREDNISolone (SOLU-MEDROL) injection  40 mg Intravenous Q8H   Followed by  . [START ON 08/05/2020] predniSONE  40 mg Oral Q breakfast  . montelukast  10 mg Oral Daily  . pantoprazole  40 mg Oral Daily  . potassium chloride SA  20 mEq Oral Daily  . rifampin  600 mg Oral Daily  . tamsulosin  0.4 mg Oral BID   Continuous Infusions:   LOS: 0 days    Time spent: 59mns    JKathie Dike MD [facility]   If 7PM-7AM, please contact night-coverage [email or web address]  08/04/2020, 11:49 AM

## 2020-08-04 NOTE — ED Provider Notes (Signed)
Called to ICU for patient with worsening WOB, hypoxia. Had been on BIPAP overnight but had an episode of vomiting into the mask earlier and not deemed safe to continue. I am familiar with this patient from his ED visit yesterday during which time we had discussed intubation. He understands the need for intubation now and gives verbal consent. Dr. Roderic Palau, Hospitalist also at the bedside and will resume care of the patient post intubation.   Procedure Name: Intubation Date/Time: 08/04/2020 7:41 PM Performed by: Truddie Hidden, MD Pre-anesthesia Checklist: Patient identified, Patient being monitored, Emergency Drugs available, Timeout performed and Suction available Oxygen Delivery Method: Non-rebreather mask Preoxygenation: Pre-oxygenation with 100% oxygen Induction Type: Rapid sequence Ventilation: Mask ventilation without difficulty Laryngoscope Size: Bronchoscope and 3 Grade View: Grade I Tube size: 7.5 mm Number of attempts: 1 Placement Confirmation: ETT inserted through vocal cords under direct vision,  CO2 detector and Breath sounds checked- equal and bilateral Secured at: 24 cm Tube secured with: ETT holder Comments: Good color change, tube seen in the trachea on Glidescope, easy to bag with chest rise and improved SpO2. Confirmatory CXR ordered.          Truddie Hidden, MD 08/04/20 [id number]

## 2020-08-04 NOTE — Plan of Care (Signed)
  Problem: Acute Rehab PT Goals(only PT should resolve) Goal: Pt Will Transfer Bed To Chair/Chair To Bed Outcome: Progressing Flowsheets (Taken 08/04/2020 1033) Pt will Transfer Bed to Chair/Chair to Bed: with supervision Goal: Pt Will Ambulate Outcome: Progressing Flowsheets (Taken 08/04/2020 1033) Pt will Ambulate:  25 feet  with min guard assist  with rolling walker Goal: Pt/caregiver will Perform Home Exercise Program Outcome: Progressing Flowsheets (Taken 08/04/2020 1033) Pt/caregiver will Perform Home Exercise Program:  For increased strengthening  With Supervision, verbal cues required/provided   10:33 AM, 08/04/20 M. Sherlyn Lees, PT, DPT Physical Therapist- [place] Office Number: [phone]

## 2020-08-04 NOTE — ED Notes (Signed)
Pt bed linens changed due to blood and urine. Pt cleaned up and put into a new gown.

## 2020-08-04 NOTE — ED Notes (Signed)
pts CBG 62. Orange juice provided for patient.

## 2020-08-04 NOTE — Progress Notes (Signed)
Patient transported from ER#6  to ICU#3 on BiPAP without incident. Respiratory therapist will continue to monitor.

## 2020-08-04 NOTE — ED Notes (Signed)
Pt work of breathing has increased. Respiratory called to place pt back on BI-Pap. MD made aware. Pt placed on 15L NRB on top of 15L high flow. Sats 100% at this time.

## 2020-08-04 NOTE — Evaluation (Signed)
Physical Therapy Evaluation Patient Details Name: Ryan [name] MRN: [id number] DOB: 09-28-1960 Today's Date: 08/04/2020   History of Present Illness  Ryan [name] is a 60 y.o. male with medical history significant of paroxysmal atrial fibrillation not on anticoagulation, barrettes esophagus, bronchitis, CAP, cavitary lesion of lung growing MAI, chronic back pain, chronic diastolic heart failure, chronic left shoulder and neck pain, collagenous vascular disease, CAD, GERD, hyperlipidemia, polycythemia, remote seizures, type 2 diabetes mellitus, former smoker, pulmonary nodule, pulmonary fibrosis, COPD on home oxygen who is coming to the emergency department due to progressively worse dyspnea for the past 2 days.  According to EMS the patient had already used bowel 8 oh his home oxygen tanks during the day before they arrived.  They treated him with NRB oxygen, 2 DuoNebs, magnesium sulfate 2 g and Solu-Medrol 125 mg IVP.  The patient was placed on BiPAP ventilation and unable to provide full information while wearing BiPAP.  He denies fever, chills, renal area, headache, sore throat, chest pain, but complains of feeling very fatigued and having epigastric/RUQ pain since earlier in the day.  He denies diarrhea, constipation, melena or hematochezia.  No dysuria, frequency or hematuria.  No polyuria, polydipsia, polyphagia or blurred vision   Clinical Impression   Patient exhibits significant functional activity tolerance deficits with mobility requiring frequent therapeutic rest periods with position change/mobility due to SOB even with BiPAP maintained and requires cues in pacing/breathing techniques with increase in HR from 90 bpm at rest to 120 bpm with position change.  Modified independent bed mobility with increased time and task segmentation.  Min A for transfers to standing due to generalized weakness and unsteadiness on feet with poor activity tolerance noted.  Limited ambulation as a result  requiring physical support for steadying and only tolerating 45-60 sec in standing.  Able to initiate steps at bedside with BiPAP maintained but poor tolerance with increase in HR and patient requesting cessation of activity.  Patient has caregiver assistance available at home and would benefit from continued PT services while hospitalized and additional services via home health services once D/C to home environment.    Follow Up Recommendations Home health PT    Equipment Recommendations  Rolling walker with 5" wheels    Recommendations for Other Services OT consult     Precautions / Restrictions        Mobility  Bed Mobility Overal bed mobility: Modified Independent             General bed mobility comments: increased time, rest periods between position change    Transfers Overall transfer level: Needs assistance Equipment used: 1 person hand held assist Transfers: Sit to/from Stand;Stand Pivot Transfers Sit to Stand: Min assist Stand pivot transfers: Min assist       General transfer comment: unsteadiness with initial rising  Ambulation/Gait Ambulation/Gait assistance: Min guard Gait Distance (Feet): 8 Feet Assistive device: 1 person hand held assist Gait Pattern/deviations: Shuffle Gait velocity: decreased      Stairs            Wheelchair Mobility    Modified Rankin (Stroke Patients Only)       Balance Overall balance assessment: Mild deficits observed, not formally tested                                           Pertinent Vitals/Pain Pain Assessment: 0-10 Pain Score:  5  Pain Location: "all over" Pain Intervention(s): Limited activity within patient's tolerance;Monitored during session;Repositioned    Home Living Family/patient expects to be discharged to:: Private residence Living Arrangements: Spouse/significant other;Children Available Help at Discharge: Family;Available 24 hours/day Type of Home: House Home  Access: Ramped entrance     Home Layout: One level Home Equipment: Bedside commode;Wheelchair - manual      Prior Function Level of Independence: Independent         Comments: household ambulator, on 6-8L O2. W/C for community excursions     Hand Dominance   Dominant Hand: Left    Extremity/Trunk Assessment        Lower Extremity Assessment Lower Extremity Assessment: Generalized weakness       Communication   Communication: No difficulties  Cognition Arousal/Alertness: Awake/alert Behavior During Therapy: WFL for tasks assessed/performed Overall Cognitive Status: Within Functional Limits for tasks assessed                                        General Comments      Exercises     Assessment/Plan    PT Assessment Patient needs continued PT services  PT Problem List Decreased strength;Decreased activity tolerance;Decreased balance;Decreased mobility;Cardiopulmonary status limiting activity       PT Treatment Interventions DME instruction;Gait training;Functional mobility training;Therapeutic activities;Therapeutic exercise;Balance training;Neuromuscular re-education;Patient/family education;Wheelchair mobility training;Manual techniques    PT Goals (Current goals can be found in the Care Plan section)  Acute Rehab PT Goals Patient Stated Goal: Improve stamina PT Goal Formulation: With patient Time For Goal Achievement: 08/18/20 Potential to Achieve Goals: Good    Frequency Min 3X/week   Barriers to discharge Other (comment) (severity of activity tolerance deficits)      Co-evaluation               AM-PAC PT "6 Clicks" Mobility  Outcome Measure Help needed turning from your back to your side while in a flat bed without using bedrails?: None Help needed moving from lying on your back to sitting on the side of a flat bed without using bedrails?: None Help needed moving to and from a bed to a chair (including a wheelchair)?: A  Little Help needed standing up from a chair using your arms (e.g., wheelchair or bedside chair)?: A Little Help needed to walk in hospital room?: A Lot Help needed climbing 3-5 steps with a railing? : A Lot 6 Click Score: 18    End of Session   Activity Tolerance: Patient limited by fatigue (maintained on BiPAP throughout due to SOB)   Nurse Communication: Mobility status PT Visit Diagnosis: Unsteadiness on feet (R26.81);Muscle weakness (generalized) (M62.81);Difficulty in walking, not elsewhere classified (R26.2)    Time: 0569-7948 PT Time Calculation (min) (ACUTE ONLY): 25 min   Charges:   PT Evaluation $PT Eval Low Complexity: 1 Low PT Treatments $Therapeutic Activity: 8-22 mins       10:31 AM, 08/04/20 M. Sherlyn Lees, PT, DPT Physical Therapist- [place] Office Number: [phone]

## 2020-08-04 NOTE — ED Notes (Signed)
Pt placed on bedpan

## 2020-08-04 NOTE — Progress Notes (Signed)
I came back to evaluate patient. Staff reported that he had vomited earlier while on bipap. He is currently on HFNC at 15L. Patient noted to have increased work of breathing and respiratory rate in the 40s. He has bilateral wheezing and rhonchi. Abdomen is distended.  A/P:  Acute on chronicrespiratory failure with hypoxia and hypercapnia -Initially, patient was placed on bipap, but began having vomiting and failed bipap -patient intubated for increased work of breath and to secure airway -repeat abg in 1 hour -discussed with E link to help with vent adjustments overnight -appreciate EDP assistance with intubation -started on fentanyl infusion and prn versed for sedation  Vomiting -abdomen appears distended -abdominal xray has been ordered -may need G tube decompression if any signs of SBO  Aspiration into airway -patient noted to have vomitted in bipap mask -change antibiotics to unasyn  Copd exacerbation -continue on steroids and antibiotics  Updated patient's wife over the phone who agreed with plan of care  Critical care procedure note Authorized and performed by: Kathie Dike Total critical care time: Approximately 40 minutes Due to high probability of clinically significant, life-threatening deterioration, the patient required my highest level of preparedness to intervene emergently and I personally spent this critical care time directly and personally managing the patient.  The critical care time included obtaining a history, examining the patient, pulse oximetry, ordering and review of studies, arranging urgent treatment with development of a management plan, evaluation of patient's response to treatment, frequent reassessment, discussions with other providers.  Critical care time was performed to assess and manage the high probability of imminent, life-threatening deterioration that could result in multiorgan failure.  It was exclusive of separate billable procedures and  treating other patients and teaching time.  Please see MDM section and the rest of the of note for further information on patient assessment and treatment

## 2020-08-04 NOTE — Progress Notes (Signed)
Initial Nutrition Assessment  DOCUMENTATION CODES:   Not applicable  INTERVENTION:  Ensure Enlive po TID, each supplement provides 350 kcal and 20 grams of protein  MVI with minerals daily  Will order weight  NUTRITION DIAGNOSIS:   Increased nutrient needs related to acute illness,chronic illness (acute on chronic respiratory failure in the setting of COPD and pulmonary fibrosis) as evidenced by estimated needs.    GOAL:   Patient will meet greater than or equal to 90% of their needs    MONITOR:   Labs,I & O's,Supplement acceptance,PO intake,Weight trends  REASON FOR ASSESSMENT:   Consult Assessment of nutrition requirement/status  ASSESSMENT:    60 year old male admitted with acute on chronic respiratory failure with hypoxia and hypercapnia presented with progressively worsening dyspnea for the past 2 days. Past medical history significant of paroxysmal atrial fibrillation, Barrettes esophagus, bronchitis, CAP, cavitary lesion of lung growing MAI, chronic back pain, chronic dCHF, collagenous vascular disease, CAD, GERD, HLD, polycythemia, remote seizures, DM2, pulmonary fibrosis, and COPD on home oxygen.  RD working remotely.  Pt initially required NRB and BiPAP in ED which was later removed, but still appeared short of breath. This afternoon noted increased work of breathing and placed back on BiPAP. Currently on 15L NRB + 15L HF, sats 100%  Pt currently in ED, unable to obtain nutrition history at this time. Suspect meal intake has been poor pta given progressive SOB, with use of 8 tanks of home O2 at home prior to EMS arrival as well as  complaints of feeling very fatigued. No documented meals at this time, suspect very poor intake given current respiratory status. Pt with increased needs secondary to COPD as well as pulmonary fibrosis and would greatly benefit from nutrient dense supplement. One Ensure Enlive supplement provides 350 kcals, 20 grams protein, and 44-45  grams of carbohydrate vs one Glucerna shake supplement, which provides 220 kcals, 10 grams of protein, and 26 grams of carbohydrate. Given pt's hx of DM, RD will reassess adequacy of PO intake, CBGS, and adjust supplement regimen as appropriate at follow-up.    No recent wt history for review. Per chart, he weighed 79.4 kg (174.68 lbs) on 02/17/20 which is also his current weight. Suspect current wt is from prior encounter. Will order weight and adjust estimated needs as appropriate.   Medications reviewed and include: Lasix 40 mg twice daily, SSI, Lantus 60 units daily, Methylprednisolone, Protonix, Klor-con, Rifadin, Rocephin  Labs: CBGs 76,62,100,248, Na 133 (L), Hgb 9.2 (L), HCT 30.1 (L)  A1c 8.3 (H) on 08/08/19 -consider checking   NUTRITION - FOCUSED PHYSICAL EXAM:  Unable to complete at this time  Diet Order:   Diet Order            Diet heart healthy/carb modified Room service appropriate? Yes; Fluid consistency: Thin  Diet effective now                 EDUCATION NEEDS:   Not appropriate for education at this time  Skin:  Skin Assessment: Reviewed RN Assessment  Last BM:  unknown  Height:   Ht Readings from Last 1 Encounters:  08/11/2020 5\' 8"  (1.727 m)    Weight:   Wt Readings from Last 1 Encounters:  07/24/2020 79.4 kg    BMI:  Body mass index is 26.61 kg/m.  Estimated Nutritional Needs:   Kcal:  2200-2450  Protein:  120-135  Fluid:  >/= 2.2 L   Lajuan Lines, RD, LDN Clinical Nutrition After Hours/Weekend Pager #  [phone]

## 2020-08-05 ENCOUNTER — Inpatient Hospital Stay (HOSPITAL_COMMUNITY): Payer: Medicare Other

## 2020-08-05 DIAGNOSIS — J9621 Acute and chronic respiratory failure with hypoxia: Secondary | ICD-10-CM | POA: Diagnosis not present

## 2020-08-05 DIAGNOSIS — K2100 Gastro-esophageal reflux disease with esophagitis, without bleeding: Secondary | ICD-10-CM | POA: Diagnosis not present

## 2020-08-05 DIAGNOSIS — I5032 Chronic diastolic (congestive) heart failure: Secondary | ICD-10-CM | POA: Diagnosis not present

## 2020-08-05 DIAGNOSIS — J441 Chronic obstructive pulmonary disease with (acute) exacerbation: Secondary | ICD-10-CM | POA: Diagnosis not present

## 2020-08-05 DIAGNOSIS — E7800 Pure hypercholesterolemia, unspecified: Secondary | ICD-10-CM

## 2020-08-05 LAB — GLUCOSE, CAPILLARY
Glucose-Capillary: 53 mg/dL — ABNORMAL LOW (ref 70–99)
Glucose-Capillary: 103 mg/dL — ABNORMAL HIGH (ref 70–99)
Glucose-Capillary: 74 mg/dL (ref 70–99)
Glucose-Capillary: 63 mg/dL — ABNORMAL LOW (ref 70–99)
Glucose-Capillary: 68 mg/dL — ABNORMAL LOW (ref 70–99)
Glucose-Capillary: 101 mg/dL — ABNORMAL HIGH (ref 70–99)
Glucose-Capillary: 138 mg/dL — ABNORMAL HIGH (ref 70–99)

## 2020-08-05 LAB — CBC
HCT: 30.9 % — ABNORMAL LOW (ref 39.0–52.0)
Hemoglobin: 9.2 g/dL — ABNORMAL LOW (ref 13.0–17.0)
MCH: 23.9 pg — ABNORMAL LOW (ref 26.0–34.0)
MCHC: 29.8 g/dL — ABNORMAL LOW (ref 30.0–36.0)
MCV: 80.3 fL (ref 80.0–100.0)
Platelets: 252 10*3/uL (ref 150–400)
RBC: 3.85 MIL/uL — ABNORMAL LOW (ref 4.22–5.81)
RDW: 16.6 % — ABNORMAL HIGH (ref 11.5–15.5)
WBC: 5.8 10*3/uL (ref 4.0–10.5)
nRBC: 0 % (ref 0.0–0.2)

## 2020-08-05 LAB — BASIC METABOLIC PANEL
Anion gap: 11 (ref 5–15)
BUN: 19 mg/dL (ref 8–23)
CO2: 30 mmol/L (ref 22–32)
Calcium: 8.3 mg/dL — ABNORMAL LOW (ref 8.9–10.3)
Chloride: 98 mmol/L (ref 98–111)
Creatinine, Ser: 1.15 mg/dL (ref 0.61–1.24)
GFR, Estimated: >60 mL/min (ref >60)
Glucose, Bld: 105 mg/dL — ABNORMAL HIGH (ref 70–99)
Potassium: 4 mmol/L (ref 3.5–5.1)
Sodium: 139 mmol/L (ref 135–145)

## 2020-08-05 LAB — TRIGLYCERIDES: Triglycerides: 176 mg/dL — ABNORMAL HIGH (ref <150)

## 2020-08-05 MED ORDER — DEXTROSE 50 % IV SOLN
25.0000 g | INTRAVENOUS | Status: AC
  Administered 2020-08-05: 25 g via INTRAVENOUS

## 2020-08-05 MED ORDER — INSULIN ASPART 100 UNIT/ML ~~LOC~~ SOLN
0.0000 [IU] | SUBCUTANEOUS | Status: DC
  Administered 2020-08-06 (×3): 1 [IU] via SUBCUTANEOUS
  Administered 2020-08-06: 2 [IU] via SUBCUTANEOUS

## 2020-08-05 MED ORDER — AZITHROMYCIN 250 MG PO TABS
250.0000 mg | ORAL_TABLET | Freq: Every day | ORAL | Status: DC
  Administered 2020-08-05 – 2020-08-07 (×3): 250 mg
  Filled 2020-08-05 (×3): qty 1

## 2020-08-05 MED ORDER — DEXTROSE 50 % IV SOLN
INTRAVENOUS | Status: AC
  Filled 2020-08-05: qty 50

## 2020-08-05 MED ORDER — DEXTROSE-NACL 5-0.45 % IV SOLN: INTRAVENOUS | Status: DC

## 2020-08-05 MED ORDER — IOHEXOL 300 MG/ML  SOLN
100.0000 mL | Freq: Once | INTRAMUSCULAR | Status: AC | PRN
  Administered 2020-08-05: 100 mL via INTRAVENOUS

## 2020-08-05 MED ORDER — DILTIAZEM HCL 30 MG PO TABS
30.0000 mg | ORAL_TABLET | Freq: Four times a day (QID) | ORAL | Status: DC
  Administered 2020-08-05 – 2020-08-07 (×2): 30 mg
  Filled 2020-08-05 (×3): qty 1

## 2020-08-05 MED ORDER — IOHEXOL 9 MG/ML PO SOLN
ORAL | Status: AC
  Administered 2020-08-05: 500 mL
  Filled 2020-08-05: qty 1000

## 2020-08-05 NOTE — Progress Notes (Signed)
Increased propofol to 30 due to patient being very awake and anxious.

## 2020-08-05 NOTE — Progress Notes (Signed)
PROGRESS NOTE    Ryan [name]  LYY:[id number] DOB: 06-Sep-1960 DOA: 07/20/2020 PCP: Emelia Loron, NP    Brief Narrative:  60 year old male with a history of paroxysmal atrial fibrillation, cavitary lesion growing MAI, diastolic heart failure, COPD on home oxygen, presents to the emergency room with worsening shortness of breath.  Found to have COPD exacerbation.  Initially required nonrebreather and BiPAP in the emergency room.  Started on steroids, antibiotics and bronchodilators. Overall respiratory status deteriorated and he was intubated on 1/22.   Assessment & Plan:   Principal Problem:   Acute on chronic respiratory failure with hypoxia and hypercapnia (HCC) Active Problems:   Hyperlipidemia   COPD exacerbation (HCC)   PAF (paroxysmal atrial fibrillation) (HCC)   Hypochromic anemia   Chronic diastolic CHF (congestive heart failure) (HCC)   GERD (gastroesophageal reflux disease)   Moderate protein malnutrition (HCC)   Acute on chronic respiratory failure with hypoxia and hypercapnia -Wife reports that he is chronically on 8L oxygen at home via concentrator -Patient was initially placed on nonrebreather mask and subsequently BiPAP in the emergency room -Unfortunately, his condition began to decline -he began vomiting while having the bipap mask on -overall wheezing and shortness of breath worsened -he was intubated for respiratory distress and to protect airway -Likely related to COPD exacerbation -will consult pulmonology to assist with vent management -currently on FiO2 of 75%, so no weaning today  COPD exacerbation -Currently on intravenous steroids -Continue bronchodilators/inhaled steroids -Continue pulmonary hygiene -Currently on IV unasyn  Vomiting -patient noted to have significant abdominal distention -he did have a small BM yesterday, none today -abd xray yesterday did not show any signs of obstruction -will check CT abdomen today  Possible aspiration  during vomiting episodes -started on unasyn -respiratory is suctioning thick secretions from ET tube  Paroxysmal atrial fibrillation -CHA2DS2-VASc score of at least 2 -On diltiazem for rate control -He has refused anticoagulation  Chronic diastolic congestive heart failure -Currently appears compensated -on home dose of lasix -Continue to follow volume status  GERD -Continue on PPI  Anxiety -Continue prn benzodiazepines  History of pulmonary MAI -Chronically on rifampin, azithromycin and clofazimine -wife reports he has been on these meds for about 2 years -will discuss with Dr. Linus Salmons, Infectious Disease regarding continuing this regimen  BPH -Continue on Flomax twice daily  Diabetes -Patient having low blood sugars -reduced basal insulin dosing  Goals of care -Patient has advanced lung disease and overall prognosis is poor -will consult palliative care to help address goals of care   DVT prophylaxis: enoxaparin (LOVENOX) injection 40 mg Start: 08/12/2020 2200  Code Status: Full code Family Communication: discussed with wife at the bedside Disposition Plan: Status is: Inpatient  Remains inpatient appropriate because:Inpatient level of care appropriate due to severity of illness   Dispo: The patient is from: Home              Anticipated d/c is to: TBD              Anticipated d/c date is: 3 days              Patient currently is not medically stable to d/c.   Difficult to place patient No         Consultants:     Procedures:   ETT 1/22>  Antimicrobials:   Unasyn 1/22 >   Subjective: Patient intubated yesterday for progressive shortness of breath, wheezing, not tolerating bipap, vomiting. Currently he appears to be stable. He has  been difficult to sedate overnight. Currently on fentanyl and propofol infusions  Objective: Vitals:   08/05/20 0742 08/05/20 0800 08/05/20 0845 08/05/20 0900  BP:  (!) 144/75 132/77 127/66  Pulse:  87 90 85  Resp:   _0 Temp: 98.7 F (37.1 C)     TempSrc: Oral     SpO2:  98% 99% 97%  Weight:      Height:        Intake/Output Summary (Last 24 hours) at 08/05/2020 1120 Last data filed at 08/05/2020 0900 Gross per 24 hour  Intake 689.34 ml  Output 950 ml  Net -260.66 ml   Filed Weights   08/06/2020 1719 08/04/20 1523 08/05/20 0345  Weight: 79.4 kg 80.7 kg 80.1 kg    Examination:  General exam: intubated, awake, no distress Respiratory system: bilateral wheezing. Respiratory effort normal. Cardiovascular system:RRR. No murmurs, rubs, gallops. Gastrointestinal system: Abdomen is distended, soft and nontender. No organomegaly or masses felt. Normal bowel sounds heard. Central nervous system:  No focal neurological deficits. Extremities: No C/C/E, +pedal pulses Skin: No rashes, lesions or ulcers Psychiatry: intubated, cannot assess    Data Reviewed: I have personally reviewed following labs and imaging studies  CBC: Recent Labs  Lab 07/27/2020 1831 08/05/20 0444  WBC 9.1 5.8  NEUTROABS 8.2*  --   HGB 9.2* 9.2*  HCT 30.1* 30.9*  MCV 80.9 80.3  PLT 255 416   Basic Metabolic Panel: Recent Labs  Lab 07/20/2020 1831 08/05/20 0444  NA 133* 139  K 4.4 4.0  CL 93* 98  CO2 28 30  GLUCOSE 263* 105*  BUN 15 19  CREATININE 1.01 1.15  CALCIUM 8.0* 8.3*   GFR: Estimated Creatinine Clearance: 65.3 mL/min (by C-G formula based on SCr of 1.15 mg/dL). Liver Function Tests: Recent Labs  Lab 07/14/2020 1831  AST 18  ALT 19  ALKPHOS 48  BILITOT 0.2*  PROT 6.6  ALBUMIN 2.9*   Recent Labs  Lab 07/20/2020 1831  LIPASE 16   No results for input(s): AMMONIA in the last 168 hours. Coagulation Profile: No results for input(s): INR, PROTIME in the last 168 hours. Cardiac Enzymes: No results for input(s): CKTOTAL, CKMB, CKMBINDEX, TROPONINI in the last 168 hours. BNP (last 3 results) No results for input(s): PROBNP in the last 8760 hours. HbA1C: Recent Labs    08/04/20 0613   HGBA1C 9.4*   CBG: Recent Labs  Lab 08/04/20 2019 08/04/20 2334 08/05/20 0359 08/05/20 0451 08/05/20 0743  GLUCAP 72 90 53* 103* 74   Lipid Profile: Recent Labs    08/05/20 0444  TRIG 176*   Thyroid Function Tests: No results for input(s): TSH, T4TOTAL, FREET4, T3FREE, THYROIDAB in the last 72 hours. Anemia Panel: No results for input(s): VITAMINB12, FOLATE, FERRITIN, TIBC, IRON, RETICCTPCT in the last 72 hours. Sepsis Labs: No results for input(s): PROCALCITON, LATICACIDVEN in the last 168 hours.  Recent Results (from the past 240 hour(s))  SARS Coronavirus 2 by RT PCR (hospital order, performed in [facility] hospital lab) Nasopharyngeal Nasopharyngeal Swab     Status: None   Collection Time: 08/11/2020  5:25 PM   Specimen: Nasopharyngeal Swab  Result Value Ref Range Status   SARS Coronavirus 2 NEGATIVE NEGATIVE Final    Comment: (NOTE) SARS-CoV-2 target nucleic acids are NOT DETECTED.  The SARS-CoV-2 RNA is generally detectable in upper and lower respiratory specimens during the acute phase of infection. The lowest concentration of SARS-CoV-2 viral copies this assay can detect is 250  copies / mL. A negative result does not preclude SARS-CoV-2 infection and should not be used as the sole basis for treatment or other patient management decisions.  A negative result may occur with improper specimen collection / handling, submission of specimen other than nasopharyngeal swab, presence of viral mutation(s) within the areas targeted by this assay, and inadequate number of viral copies (<250 copies / mL). A negative result must be combined with clinical observations, patient history, and epidemiological information.  Fact Sheet for Patients:   StrictlyIdeas.no  Fact Sheet for Healthcare Providers: BankingDealers.co.za  This test is not yet approved or  cleared by the [place] FDA and has been authorized for detection  and/or diagnosis of SARS-CoV-2 by FDA under an Emergency Use Authorization (EUA).  This EUA will remain in effect (meaning this test can be used) for the duration of the COVID-19 declaration under Section 564(b)(1) of the Act, 21 U.S.C. section 360bbb-3(b)(1), unless the authorization is terminated or revoked sooner.  Performed at [facility], [address] [place], [place] [address]          Radiology Studies: DG Abd 1 View  Result Date: 08/04/2020 CLINICAL DATA:  Vomiting EXAM: ABDOMEN - 1 VIEW COMPARISON:  05/29/2019 CT FINDINGS: Moderate gaseous distention of the stomach. This could represent air swallowing, gastroparesis, or outlet obstruction. Scattered gas and stool in the colon. No small or large bowel distention. No radiopaque stones. Visualized bones appear intact. Degenerative changes in the lumbar spine. IMPRESSION: Moderate gaseous distention of the stomach. No evidence of small bowel obstruction. Electronically Signed   By: Lucienne Capers M.D.   On: 08/04/2020 19:43   DG Chest Port 1 View  Result Date: 08/04/2020 CLINICAL DATA:  Intubated EXAM: PORTABLE CHEST 1 VIEW COMPARISON:  07/14/2020 FINDINGS: 2 frontal views of the chest demonstrate endotracheal tube overlying tracheal air column tip well above carina. Enteric catheter passes below diaphragm tip and side port project over gastric fundus. Cardiac silhouette is stable. Chronic scarring and volume loss within the left chest. Diffuse emphysema. No airspace disease, effusion, or pneumothorax. IMPRESSION: 1. No complication after intubation. 2. Stable emphysema and bilateral scarring. Electronically Signed   By: Randa Ngo M.D.   On: 08/04/2020 20:06   DG Chest Port 1 View  Result Date: 08/12/2020 CLINICAL DATA:  Shortness of breath for 1 day EXAM: PORTABLE CHEST 1 VIEW COMPARISON:  02/17/2020 FINDINGS: Cardiac shadow is stable. Chronic scarring in the left mid and upper lung is seen and improved. No focal infiltrate  or sizable effusion is seen. Mild scarring is noted in the bases right greater than left stable from the prior study. No acute bony abnormality is seen. IMPRESSION: Chronic scarring in the left upper lobe but improved from the prior exam. Bibasilar scarring is noted right greater than left. No acute infiltrate is seen. Electronically Signed   By: Inez Catalina M.D.   On: 07/26/2020 17:43        Scheduled Meds: . azithromycin  250 mg Per Tube Daily  . benzonatate  200 mg Oral TID  . budesonide (PULMICORT) nebulizer solution  0.25 mg Nebulization BID  . chlorhexidine gluconate (MEDLINE KIT)  15 mL Mouth Rinse BID  . Chlorhexidine Gluconate Cloth  6 each Topical Daily  . citalopram  20 mg Oral Daily  . dextromethorphan-guaiFENesin  1 tablet Oral BID  . diltiazem  30 mg Per Tube Q6H  . docusate  100 mg Per Tube BID  . enoxaparin (LOVENOX) injection  40 mg  Subcutaneous Q24H  . feeding supplement  237 mL Oral TID BM  . furosemide  40 mg Oral BID  . insulin aspart  0-9 Units Subcutaneous Q4H  . insulin glargine  40 Units Subcutaneous Daily  . ipratropium  0.5 mg Nebulization Q6H  . levalbuterol  1.25 mg Nebulization Q6H  . mouth rinse  15 mL Mouth Rinse 10 times per day  . methocarbamol  750 mg Oral QID  . methylPREDNISolone (SOLU-MEDROL) injection  60 mg Intravenous Q6H  . montelukast  10 mg Oral Daily  . multivitamin with minerals  1 tablet Oral Daily  . pantoprazole  40 mg Oral Daily  . polyethylene glycol  17 g Per Tube Daily  . rifampin  600 mg Oral Daily  . tamsulosin  0.4 mg Oral BID   Continuous Infusions: . sodium chloride 250 mL (08/04/20 2313)  . ampicillin-sulbactam (UNASYN) IV 3 g (08/05/20 0845)  . dextrose 5 % and 0.45% NaCl    . fentaNYL infusion INTRAVENOUS 200 mcg/hr (08/05/20 0601)  . propofol (DIPRIVAN) infusion 30 mcg/kg/min (08/05/20 1008)     LOS: 1 day    Critical care procedure note Authorized and performed by: Kathie Dike Total critical care time:  Approximately 40 minutes Due to high probability of clinically significant, life-threatening deterioration, the patient required my highest level of preparedness to intervene emergently and I personally spent this critical care time directly and personally managing the patient.  The critical care time included obtaining a history, examining the patient, pulse oximetry, ordering and review of studies, arranging urgent treatment with development of a management plan, evaluation of patient's response to treatment, frequent reassessment, discussions with other providers.  Critical care time was performed to assess and manage the high probability of imminent, life-threatening deterioration that could result in multiorgan failure.  It was exclusive of separate billable procedures and treating other patients and teaching time.  Please see MDM section and the rest of the of note for further information on patient assessment and treatment     Kathie Dike, MD [facility]   If 7PM-7AM, please contact night-coverage [email or web address]  08/05/2020, 11:20 AM

## 2020-08-05 NOTE — Progress Notes (Signed)
10:48PM 08/04/20 RN called due to patient fighting the vent despite Versed pushes and continuous fentanyl drip IV propofol started; temporal peripheral IV Levophed will be started if patient's BP continues to be in hypotensive range while on propofol.

## 2020-08-05 NOTE — Progress Notes (Signed)
Patient is extremely hard to sedate while on vent. He has long history of prescribed pain rx.  Nursing has done well. Have etsx large amount of tan looking secretions through out night. Had decreased oxygen down to 60 percent but have slowly had to increase oxygen back up to 75. Have given extra neb through vent as patients lung sounds are very decreased with wheezes. Patient has tried numerous times to extubate himself and will do so if not watched especially on weaning in the next few days. He does have anxiety issues.

## 2020-08-06 ENCOUNTER — Inpatient Hospital Stay (HOSPITAL_COMMUNITY): Payer: Medicare Other

## 2020-08-06 DIAGNOSIS — J9621 Acute and chronic respiratory failure with hypoxia: Secondary | ICD-10-CM | POA: Diagnosis not present

## 2020-08-06 DIAGNOSIS — E440 Moderate protein-calorie malnutrition: Secondary | ICD-10-CM | POA: Diagnosis not present

## 2020-08-06 DIAGNOSIS — I5032 Chronic diastolic (congestive) heart failure: Secondary | ICD-10-CM | POA: Diagnosis not present

## 2020-08-06 DIAGNOSIS — Z7189 Other specified counseling: Secondary | ICD-10-CM

## 2020-08-06 DIAGNOSIS — J441 Chronic obstructive pulmonary disease with (acute) exacerbation: Secondary | ICD-10-CM | POA: Diagnosis not present

## 2020-08-06 DIAGNOSIS — Z515 Encounter for palliative care: Secondary | ICD-10-CM | POA: Diagnosis not present

## 2020-08-06 DIAGNOSIS — J449 Chronic obstructive pulmonary disease, unspecified: Secondary | ICD-10-CM

## 2020-08-06 LAB — GLUCOSE, CAPILLARY
Glucose-Capillary: 132 mg/dL — ABNORMAL HIGH (ref 70–99)
Glucose-Capillary: 132 mg/dL — ABNORMAL HIGH (ref 70–99)
Glucose-Capillary: 158 mg/dL — ABNORMAL HIGH (ref 70–99)
Glucose-Capillary: 186 mg/dL — ABNORMAL HIGH (ref 70–99)
Glucose-Capillary: 223 mg/dL — ABNORMAL HIGH (ref 70–99)
Glucose-Capillary: 234 mg/dL — ABNORMAL HIGH (ref 70–99)

## 2020-08-06 LAB — CBC
HCT: 30.9 % — ABNORMAL LOW (ref 39.0–52.0)
Hemoglobin: 9 g/dL — ABNORMAL LOW (ref 13.0–17.0)
MCH: 23.8 pg — ABNORMAL LOW (ref 26.0–34.0)
MCHC: 29.1 g/dL — ABNORMAL LOW (ref 30.0–36.0)
MCV: 81.7 fL (ref 80.0–100.0)
Platelets: 222 10*3/uL (ref 150–400)
RBC: 3.78 MIL/uL — ABNORMAL LOW (ref 4.22–5.81)
RDW: 17 % — ABNORMAL HIGH (ref 11.5–15.5)
WBC: 8.4 10*3/uL (ref 4.0–10.5)
nRBC: 0 % (ref 0.0–0.2)

## 2020-08-06 LAB — COMPREHENSIVE METABOLIC PANEL
ALT: 20 U/L (ref 0–44)
AST: 30 U/L (ref 15–41)
Albumin: 2.6 g/dL — ABNORMAL LOW (ref 3.5–5.0)
Alkaline Phosphatase: 42 U/L (ref 38–126)
Anion gap: 10 (ref 5–15)
BUN: 15 mg/dL (ref 8–23)
CO2: 31 mmol/L (ref 22–32)
Calcium: 7.9 mg/dL — ABNORMAL LOW (ref 8.9–10.3)
Chloride: 95 mmol/L — ABNORMAL LOW (ref 98–111)
Creatinine, Ser: 1 mg/dL (ref 0.61–1.24)
GFR, Estimated: >60 mL/min (ref >60)
Glucose, Bld: 120 mg/dL — ABNORMAL HIGH (ref 70–99)
Potassium: 3.9 mmol/L (ref 3.5–5.1)
Sodium: 136 mmol/L (ref 135–145)
Total Bilirubin: 0.4 mg/dL (ref 0.3–1.2)
Total Protein: 6.6 g/dL (ref 6.5–8.1)

## 2020-08-06 LAB — BLOOD GAS, ARTERIAL
Acid-Base Excess: 6.7 mmol/L — ABNORMAL HIGH (ref 0.0–2.0)
Bicarbonate: 30.4 mmol/L — ABNORMAL HIGH (ref 20.0–28.0)
FIO2: 65
O2 Saturation: 94.4 %
Patient temperature: 37
pCO2 arterial: 43.9 mmHg (ref 32.0–48.0)
pH, Arterial: 7.459 — ABNORMAL HIGH (ref 7.350–7.450)
pO2, Arterial: 77.1 mmHg — ABNORMAL LOW (ref 83.0–108.0)

## 2020-08-06 MED ORDER — LORAZEPAM 2 MG/ML IJ SOLN
1.0000 mg | INTRAMUSCULAR | Status: DC
  Administered 2020-08-06 – 2020-08-07 (×5): 1 mg via INTRAVENOUS
  Filled 2020-08-06 (×6): qty 1

## 2020-08-06 MED ORDER — POLYVINYL ALCOHOL 1.4 % OP SOLN: 1.0000 [drp] | Freq: Four times a day (QID) | OPHTHALMIC | Status: DC | PRN

## 2020-08-06 MED ORDER — GLYCOPYRROLATE 0.2 MG/ML IJ SOLN
0.2000 mg | INTRAMUSCULAR | Status: DC | PRN
  Administered 2020-08-07: 0.2 mg via INTRAVENOUS
  Filled 2020-08-06 (×2): qty 1

## 2020-08-06 MED ORDER — DIPHENHYDRAMINE HCL 50 MG/ML IJ SOLN: 12.5000 mg | Freq: Three times a day (TID) | INTRAMUSCULAR | Status: DC | PRN

## 2020-08-06 MED ORDER — MORPHINE 100MG IN NS 100ML (1MG/ML) PREMIX INFUSION
4.0000 mg/h | INTRAVENOUS | Status: DC
  Administered 2020-08-06 – 2020-08-07 (×2): 4 mg/h via INTRAVENOUS
  Filled 2020-08-06 (×2): qty 100

## 2020-08-06 MED ORDER — MORPHINE BOLUS VIA INFUSION
2.0000 mg | INTRAVENOUS | Status: DC | PRN
  Administered 2020-08-06: 4 mg via INTRAVENOUS
  Filled 2020-08-06: qty 4

## 2020-08-06 MED ORDER — GLYCOPYRROLATE 0.2 MG/ML IJ SOLN: 0.2000 mg | INTRAMUSCULAR | Status: DC | PRN

## 2020-08-06 MED ORDER — LORAZEPAM 2 MG/ML IJ SOLN
1.0000 mg | INTRAMUSCULAR | Status: DC | PRN
  Administered 2020-08-07: 2 mg via INTRAVENOUS
  Filled 2020-08-06: qty 1

## 2020-08-06 MED ORDER — GLYCOPYRROLATE 1 MG PO TABS: 1.0000 mg | ORAL_TABLET | ORAL | Status: DC | PRN

## 2020-08-06 NOTE — Progress Notes (Signed)
Bilateral restraints order discontinued as patient does not meet criteria anymore for them as he is comfort care and moderately sedated. Pt has multiple family members at bedside and would notify RN if patient was pulling at anything or in any distress. Will continue to monitor.

## 2020-08-06 NOTE — Consult Note (Signed)
Consultation Note Date: 08/06/2020   Patient Name: Ryan [name]  DOB: 1960/12/03  MRN: [id number]  Age / Sex: 60 y.o., male  PCP: Ryan Loron, NP Referring Physician: Kathie Dike, MD  Reason for Consultation: Establishing goals of care  HPI/Patient Profile: 60 y.o. male  with past medical history of atrial fibrillation not on anticoagulation, diastolic CHF, CAD, HLD, Barrett's esophagus, CAP, cavitary lesion of lung growing MAI, COPD on home oxygen, diabetes, chronic back pain, GERD, remote seizures admitted on 07/26/2020 with shortness of breath due to COPD exacerbation and requiring BiPAP initially and he vomited on BiPAP and required intubation 08/04/20. Per notes he has ben difficult to sedate on ventilator and attempts at self extubation and still requiring 65% FiO2. Noted GOC document in [place] completed by Ryan [name] noting conversation with family regarding desire for full aggressive care with hopes of more time with family and "if doctors determine that he will not recover to a reasonable quality of life he would want his wife to make decisions at that time to continue or discontinue life support."   Clinical Assessment and Goals of Care: I met today with Ryan [name] wife, Ryan [name]. Ryan [name] has had a chance to speak with the doctors and with pulmonologist. She shares with me today that they have known that he had end stage COPD for over a year now. They were recommended hospice care a year ago as well. She reports that [name] has always said that he wants everything done and we discussed that we have done everything but his lungs are critically ill and nothing more we can do to recover them. Ryan [name] understands. Given his terminal condition she makes decision for DNR at this time. She wishes for family to be able to see him and visit with him before we liberalize him from ventilator. He has family in  [place] that cannot come today due to weather but hopeful to get here in the next 1-2 days. She would like to give them opportunity to visit and then proceed with one way extubation. In the meantime we will liberalize medications to make him more comfortable and restful on ventilator and she agrees with morphine use at this time. He has history of significant anxiety that has only worsened with progression of COPD and fear of death.   All questions/concerns addressed. Emotional support provided.   Primary Decision Maker NEXT OF KIN wife Ryan [name]     SUMMARY OF RECOMMENDATIONS   - DNR.  - Focus on comfort.  - One way extubation once family arrive and able to visit. Likely over the next 1-2 days.   Code Status/Advance Care Planning:  DNR   Symptom Management:   Transition from fentanyl infusion to morphine infusion to better ensure comfort. Titrate down/off propofol as able once morphine optimized.  Anxiety: Ativan 1 mg every 4 hours scheduled and 1-2 mg every 2 hours as needed.   Palliative Prophylaxis:   Aspiration, Bowel Regimen, Eye Care, Frequent Pain Assessment, Oral Care, Palliative Wound Care and Turn  Reposition  Additional Recommendations (Limitations, Scope, Preferences):  Full Comfort Care  Psycho-social/Spiritual:   Desire for further Chaplaincy support:yes  Additional Recommendations: Grief/Bereavement Support  Prognosis:   Hours - Days once extubated  Discharge Planning: Anticipated Hospital Death       Primary Diagnoses: Present on Admission: . Acute on chronic respiratory failure with hypoxia and hypercapnia (HCC) . Chronic diastolic CHF (congestive heart failure) ([place]) . GERD (gastroesophageal reflux disease) . Hyperlipidemia . PAF (paroxysmal atrial fibrillation) ([place]) . Hypochromic anemia . Moderate protein malnutrition ([place]) . COPD exacerbation ([place])   I have reviewed the medical record, interviewed the patient and family, and examined the  patient. The following aspects are pertinent.  Past Medical History:  Diagnosis Date  . Atrial fibrillation ([place])    Not anticoagulated  . Barrett's esophagus   . Borderline diabetes   . Bronchitis   . CAP (community acquired pneumonia) 07/10/2013   04/2015  . 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 Chronic back pain   . Chronic diastolic CHF (congestive heart failure) ([place])   . Chronic left shoulder pain   . Chronic neck pain   . Chronic respiratory failure ([place])   . Collagen vascular disease ([place])   . COPD (chronic obstructive pulmonary disease) ([place])   . Coronary atherosclerosis of native coronary artery    Mild nonobstructive disease at catheterization 2007  . DDD (degenerative disc disease)    Cervical and thoracic  . Diastolic heart failure ([place])   . GERD (gastroesophageal reflux disease)   . History of pneumonia   . Hypercholesteremia   . On home O2    6L N/C  . Polycythemia   . Pulmonary fibrosis ([place]) 05/10/2015   For f/u HRCT 11/01/14    . Pulmonary nodule    F/u CT ordered for 11/01/15    . Seizures ([place])    Last seizure 2 yrs ago  . Smoker   . Type 2 diabetes mellitus ([place])   . Type 2 diabetes mellitus without complication (HCC)    Social History   Socioeconomic History  . Marital status: Married    Spouse name: Ryan [name]  . Number of children: 4  . Years of education: 10  . Highest education level: Not on file  Occupational History  . Not on file  Tobacco Use  . Smoking status: Former Smoker    Packs/day: 0.50    Years: 45.00    Pack years: 22.50    Types: Cigarettes    Quit date: 05/01/2015    Years since quitting: 5.2  . Smokeless tobacco: Never Used  Vaping Use  . Vaping Use: Never used  Substance and Sexual Activity  . Alcohol use: No    Alcohol/week: 0.0 standard drinks  . Drug use: No  . Sexual activity: Yes    Birth control/protection: Surgical  Other Topics Concern  . Not on file  Social History  Narrative   Patient is married Ryan [name]) and lives at home with his wife and one child.   Patient has four children.   Patient is disabled.   Patient has a high school education.   Patient is left-handed.   Patient does not drink any caffeine.   Social Determinants of Health   Financial Resource Strain: Not on file  Food Insecurity: Not on file  Transportation Needs: Not on file  Physical Activity: Not on file  Stress: Not on file  Social Connections: Not on file   Family  History  Problem Relation Age of Onset  . Hypertension Mother   . Diabetes Mother   . Heart attack Mother   . Heart failure Mother   . Hypertension Sister   . Diabetes Sister   . Heart failure Sister   . Colon cancer Neg Hx    Scheduled Meds: . azithromycin  250 mg Per Tube Daily  . benzonatate  200 mg Oral TID  . budesonide (PULMICORT) nebulizer solution  0.25 mg Nebulization BID  . chlorhexidine gluconate (MEDLINE KIT)  15 mL Mouth Rinse BID  . Chlorhexidine Gluconate Cloth  6 each Topical Daily  . citalopram  20 mg Oral Daily  . dextromethorphan-guaiFENesin  1 tablet Oral BID  . diltiazem  30 mg Per Tube Q6H  . docusate  100 mg Per Tube BID  . enoxaparin (LOVENOX) injection  40 mg Subcutaneous Q24H  . feeding supplement  237 mL Oral TID BM  . furosemide  40 mg Oral BID  . insulin aspart  0-9 Units Subcutaneous Q4H  . insulin glargine  40 Units Subcutaneous Daily  . ipratropium  0.5 mg Nebulization Q6H  . levalbuterol  1.25 mg Nebulization Q6H  . mouth rinse  15 mL Mouth Rinse 10 times per day  . methocarbamol  750 mg Oral QID  . methylPREDNISolone (SOLU-MEDROL) injection  60 mg Intravenous Q6H  . montelukast  10 mg Oral Daily  . multivitamin with minerals  1 tablet Oral Daily  . pantoprazole  40 mg Oral Daily  . polyethylene glycol  17 g Per Tube Daily  . rifampin  600 mg Oral Daily  . tamsulosin  0.4 mg Oral BID   Continuous Infusions: . sodium chloride 250 mL (08/04/20 2313)  .  ampicillin-sulbactam (UNASYN) IV 3 g (08/06/20 0246)  . dextrose 5 % and 0.45% NaCl 75 mL/hr at 08/06/20 0325  . fentaNYL infusion INTRAVENOUS 150 mcg/hr (08/05/20 1850)  . propofol (DIPRIVAN) infusion 40 mcg/kg/min (08/06/20 0558)   PRN Meds:.acetaminophen **OR** acetaminophen, alum & mag hydroxide-simeth, diphenhydrAMINE, fentaNYL, HYDROcodone-homatropine, levalbuterol, LORazepam, midazolam, nitroGLYCERIN, oxyCODONE-acetaminophen **AND** oxyCODONE, prochlorperazine Allergies  Allergen Reactions  . Albuterol Palpitations    Causes pt to have a-fib   . 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 Influenza Vaccine Live Swelling   Review of Systems  Unable to perform ROS: Intubated    Physical Exam Vitals and nursing note reviewed.  Constitutional:      General: He is not in acute distress.    Appearance: He is ill-appearing.     Interventions: He is sedated and intubated.  Cardiovascular:     Rate and Rhythm: Normal rate.  Pulmonary:     Effort: No tachypnea, accessory muscle usage or respiratory distress. He is intubated.  Abdominal:     General: There is distension.  Neurological:     Comments: Sedated - did not attempt to awaken     Vital Signs: BP 114/62   Pulse 98   Temp (!) 97.5 F (36.4 C) (Axillary)   Resp (!) 26   Ht $R'5\' 8"'Gj$  (1.727 m)   Wt 66.9 kg   SpO2 98%   BMI 22.43 kg/m  Pain Scale: CPOT POSS *See Group Information*: 2-Acceptable,Slightly drowsy, easily aroused Pain Score: 2    SpO2: SpO2: 98 % O2 Device:SpO2: 98 % O2 Flow Rate: .O2 Flow Rate (L/min): 15 L/min  IO: Intake/output summary:   Intake/Output Summary (Last 24 hours) at 08/06/2020 0834 Last  data filed at 08/06/2020 0350 Gross per 24 hour  Intake 2825.33 ml  Output 1850 ml  Net 975.33 ml    LBM:   Baseline Weight: Weight: 79.4 kg Most recent weight: Weight: 66.9 kg     Palliative Assessment/Data:     Time In:  1110 Time Out: 1200 Time Total: 50 min Greater than 50%  of this time was spent counseling and coordinating care related to the above assessment and plan.  Signed by: Vinie Sill, NP Palliative Medicine Team Pager # [phone] (M-F 8a-5p) Team Phone # [phone] (Nights/Weekends)

## 2020-08-06 NOTE — Progress Notes (Signed)
OT Cancellation Note  Patient Details Name: Ryan [name] MRN: [id number] DOB: Mar 17, 1961   Cancelled Treatment:    Reason Eval/Treat Not Completed: Fatigue/lethargy limiting ability to participate;Patient's level of consciousness. Unable to complete OT evaluation this morning. Patient is currently ventilated and sedated. Will continue to monitor and complete OT evaluation when patient is appropriate.    Ailene Ravel, OTR/L,CBIS  [phone]  08/06/2020, 9:15 AM

## 2020-08-06 NOTE — Progress Notes (Signed)
PROGRESS NOTE    Ryan [name]  YHC:[id number] DOB: 05-Aug-1960 DOA: 07/20/2020 PCP: Emelia Loron, NP    Brief Narrative:  60 year old male with a history of paroxysmal atrial fibrillation, cavitary lesion growing MAI, diastolic heart failure, COPD on home oxygen, presents to the emergency room with worsening shortness of breath.  Found to have COPD exacerbation.  Initially required nonrebreather and BiPAP in the emergency room.  Started on steroids, antibiotics and bronchodilators. Overall respiratory status deteriorated and he was intubated on 1/22.   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  quit smoking 05/01/15    COPD exacerbation (HCC)   PAF (paroxysmal atrial fibrillation) (HCC)   Hypochromic anemia   Chronic diastolic CHF (congestive heart failure) (HCC)   GERD (gastroesophageal reflux disease)   Moderate protein malnutrition (HCC)   Acute on chronic respiratory failure with hypoxia and hypercapnia -Wife reports that he is chronically on 8L oxygen at home via concentrator -Patient was initially placed on nonrebreather mask and subsequently BiPAP in the emergency room -Unfortunately, his condition began to decline -he began vomiting while having the bipap mask on -overall wheezing and shortness of breath worsened -he was intubated for respiratory distress and to protect airway 1/22 -Likely related to COPD exacerbation -Appreciate pulmonology assistance with vent management -Recommendations were for palliative care/hospice since he is end-stage COPD  COPD exacerbation -Currently on intravenous steroids -Continue bronchodilators/inhaled steroids -Continue pulmonary hygiene -Currently on IV unasyn  Vomiting -patient noted to have significant abdominal distention -abd xray yesterday did not show any signs of obstruction -CT abdomen does  not show any evidence of obstruction  Possible aspiration during vomiting episodes -started on unasyn -respiratory is suctioning thick secretions from ET tube  Paroxysmal atrial fibrillation -CHA2DS2-VASc score of at least 2 -On diltiazem for rate control -He has refused anticoagulation  Chronic diastolic congestive heart failure -Currently appears compensated -on home dose of lasix -Continue to follow volume status  GERD -Continue on PPI  Anxiety -Continue prn benzodiazepines  History of pulmonary MAI -Chronically on rifampin, azithromycin and clofazimine -wife reports he has been on these meds for about 2 years  BPH -Continue on Flomax twice daily  Diabetes -Patient having low blood sugars -reduced basal insulin dosing  Goals of care -Patient has advanced lung disease and overall prognosis is poor -Palliative care met with patient's wife to discuss goals of care -Patient is now DNR and plans are for one-way extubation once remainder family has arrived -We will continue supportive measures for now -We will discuss with patient's wife regarding discontinuing medications not related to discomfort   DVT prophylaxis:   Code Status: Full code Family Communication: Left voicemail for wife 1/24 Disposition Plan: Status is: Inpatient  Remains inpatient appropriate because:Inpatient level of care appropriate due to severity of illness   Dispo: The patient is from: Home              Anticipated d/c is to: TBD              Anticipated d/c date is: 3 days              Patient currently is not medically stable to d/c.   Difficult to place patient No     Consultants:   Pulmonology  Palliative care  Procedures:   ETT 1/22>  Antimicrobials:   Unasyn 1/22 >  Subjective: Patient remains intubated and sedated, currently appears comfortable  Objective: Vitals:   08/06/20 1544 08/06/20 1600 08/06/20 1643 08/06/20 1645  BP:    100/63  Pulse:  (!) 49 (!) 53  (!) 50  Resp:  (!) 24 (!) 24 (!) 24  Temp:      TempSrc:      SpO2: 99% 99% 97% 98%  Weight:      Height:        Intake/Output Summary (Last 24 hours) at 08/06/2020 1808 Last data filed at 08/06/2020 1740 Gross per 24 hour  Intake 2704.19 ml  Output 3525 ml  Net -820.81 ml   Filed Weights   08/04/20 1523 08/05/20 0345 08/06/20 0349  Weight: 80.7 kg 80.1 kg 66.9 kg    Examination: General exam: Intubated and sedated Respiratory system: Clear to auscultation. Respiratory effort normal. Cardiovascular system:RRR. No murmurs, rubs, gallops. Gastrointestinal system: Abdomen is distended, soft and nontender. No organomegaly or masses felt. Normal bowel sounds heard. Central nervous system: Unable to assess due to sedation Extremities: No C/C/E, +pedal pulses Skin: No rashes, lesions or ulcers Psychiatry: Unable to assess due to sedation     Data Reviewed: I have personally reviewed following labs and imaging studies  CBC: Recent Labs  Lab 07/22/2020 1831 08/05/20 0444 08/06/20 0731  WBC 9.1 5.8 8.4  NEUTROABS 8.2*  --   --   HGB 9.2* 9.2* 9.0*  HCT 30.1* 30.9* 30.9*  MCV 80.9 80.3 81.7  PLT 255 252 952   Basic Metabolic Panel: Recent Labs  Lab 07/17/2020 1831 08/05/20 0444 08/06/20 0731  NA 133* 139 136  K 4.4 4.0 3.9  CL 93* 98 95*  CO2 '28 30 31  ' GLUCOSE 263* 105* 120*  BUN '15 19 15  ' CREATININE 1.01 1.15 1.00  CALCIUM 8.0* 8.3* 7.9*   GFR: Estimated Creatinine Clearance: 73.4 mL/min (by C-G formula based on SCr of 1 mg/dL). Liver Function Tests: Recent Labs  Lab 08/11/2020 1831 08/06/20 0731  AST 18 30  ALT 19 20  ALKPHOS 48 42  BILITOT 0.2* 0.4  PROT 6.6 6.6  ALBUMIN 2.9* 2.6*   Recent Labs  Lab 08/10/2020 1831  LIPASE 16   No results for input(s): AMMONIA in the last 168 hours. Coagulation Profile: No results for input(s): INR, PROTIME in the last 168 hours. Cardiac Enzymes: No results for input(s): CKTOTAL, CKMB, CKMBINDEX, TROPONINI in the  last 168 hours. BNP (last 3 results) No results for input(s): PROBNP in the last 8760 hours. HbA1C: Recent Labs    08/04/20 0613  HGBA1C 9.4*   CBG: Recent Labs  Lab 08/05/20 2325 08/06/20 0330 08/06/20 0747 08/06/20 1134 08/06/20 1654  GLUCAP 138* 158* 132* 132* 186*   Lipid Profile: Recent Labs    08/05/20 0444  TRIG 176*   Thyroid Function Tests: No results for input(s): TSH, T4TOTAL, FREET4, T3FREE, THYROIDAB in the last 72 hours. Anemia Panel: No results for input(s): VITAMINB12, FOLATE, FERRITIN, TIBC, IRON, RETICCTPCT in the last 72 hours. Sepsis Labs: No results for input(s): PROCALCITON, LATICACIDVEN in the last 168 hours.  Recent Results (from the past 240 hour(s))  SARS Coronavirus 2 by RT PCR (hospital order, performed in [facility] hospital lab) Nasopharyngeal Nasopharyngeal Swab     Status: None   Collection Time: 07/19/2020  5:25 PM   Specimen: Nasopharyngeal Swab  Result Value Ref Range Status   SARS Coronavirus 2 NEGATIVE NEGATIVE Final    Comment: (NOTE) SARS-CoV-2 target nucleic acids are NOT DETECTED.  The SARS-CoV-2 RNA is generally detectable in upper and lower respiratory specimens during the acute phase of infection. The lowest concentration of SARS-CoV-2 viral copies this assay can detect is 250 copies / mL. A negative result does not preclude SARS-CoV-2 infection and should not be used as the sole basis for treatment or other patient management decisions.  A negative result may occur with improper specimen collection / handling, submission of specimen other than nasopharyngeal swab, presence of viral mutation(s) within the areas targeted by this assay, and inadequate number of viral copies (<250 copies / mL). A negative result must be combined with clinical observations, patient history, and epidemiological information.  Fact Sheet for Patients:   StrictlyIdeas.no  Fact Sheet for Healthcare  Providers: BankingDealers.co.za  This test is not yet approved or  cleared by the [place] FDA and has been authorized for detection and/or diagnosis of SARS-CoV-2 by FDA under an Emergency Use Authorization (EUA).  This EUA will remain in effect (meaning this test can be used) for the duration of the COVID-19 declaration under Section 564(b)(1) of the Act, 21 U.S.C. section 360bbb-3(b)(1), unless the authorization is terminated or revoked sooner.  Performed at [facility], [address] [place], [place] [address]          Radiology Studies: DG Abd 1 View  Result Date: 08/04/2020 CLINICAL DATA:  Vomiting EXAM: ABDOMEN - 1 VIEW COMPARISON:  05/29/2019 CT FINDINGS: Moderate gaseous distention of the stomach. This could represent air swallowing, gastroparesis, or outlet obstruction. Scattered gas and stool in the colon. No small or large bowel distention. No radiopaque stones. Visualized bones appear intact. Degenerative changes in the lumbar spine. IMPRESSION: Moderate gaseous distention of the stomach. No evidence of small bowel obstruction. Electronically Signed   By: Lucienne Capers M.D.   On: 08/04/2020 19:43   CT ABDOMEN PELVIS W CONTRAST  Result Date: 08/05/2020 CLINICAL DATA:  60 year old male with abdominal distention, nausea vomiting. Concern for bowel obstruction. EXAM: CT ABDOMEN AND PELVIS WITH CONTRAST TECHNIQUE: Multidetector CT imaging of the abdomen and pelvis was performed using the standard protocol following bolus administration of intravenous contrast. CONTRAST:  150m OMNIPAQUE IOHEXOL 300 MG/ML  SOLN COMPARISON:  CT abdomen pelvis dated 05/29/2019. FINDINGS: Lower chest: Emphysema with areas of atelectasis/scarring. Faint diffuse parenchymal densities may represent atelectasis or sequela of prior atypical infection. No intra-abdominal free air or free fluid. Hepatobiliary: Probable mild fatty liver. No intrahepatic biliary dilatation. The  gallbladder is unremarkable. Pancreas: Unremarkable. No pancreatic ductal dilatation or surrounding inflammatory changes. Spleen: Normal in size without focal abnormality. Adrenals/Urinary Tract: The adrenal glands unremarkable. Mild bilateral renal parenchyma atrophy. There is no hydronephrosis on either side. There is symmetric enhancement and excretion of contrast by both kidneys. The visualized ureters appear unremarkable. The urinary bladder is decompressed around a Foley catheter. Stomach/Bowel: An enteric tube is noted with tip in the body of the stomach. There are small scattered sigmoid diverticula without active inflammatory changes. There is no bowel obstruction or active inflammation. The appendix is normal. Vascular/Lymphatic: Advanced aortoiliac atherosclerotic disease. The IVC is unremarkable. No portal venous gas. There is no adenopathy. Reproductive: The prostate and seminal vesicles are grossly unremarkable. Other: Small umbilical hernia containing a short segment of small bowel without evidence of obstruction or inflammation. Musculoskeletal: Mild degenerative changes of the spine. No acute osseous pathology. IMPRESSION: 1. No acute intra-abdominal or pelvic pathology. No bowel obstruction. Normal appendix. 2. Small scattered sigmoid diverticula. 3. Aortic Atherosclerosis (ICD10-I70.0) and Emphysema (ICD10-J43.9). Electronically Signed  By: Anner Crete M.D.   On: 08/05/2020 15:35   DG CHEST PORT 1 VIEW  Result Date: 08/06/2020 CLINICAL DATA:  Progressive dyspnea EXAM: PORTABLE CHEST 1 VIEW COMPARISON:  08/04/2020, CT 02/17/2020 FINDINGS: Endotracheal tube seen 2.9 cm above the carina. Nasogastric tube extends into the upper abdomen beyond the margin of the examination. Changes of severe centrilobular emphysema are again identified. Parenchymal scarring within the left apex is unchanged. Left suprahilar rounded opacity appears stable from a immediate prior examination, however, appears  progressed from a prior examination of 02/17/2020 and may represent residual opacity related to the cavitary lesion noted on a prior CT examination. This is not well assessed on this examination. No pneumothorax or pleural effusion. Cardiac size within normal limits. IMPRESSION: Support tubes in expected position. Severe emphysema. Left suprahilar mass, possibly the residua of prior cavitary pneumonia, but indeterminate on this examination. This could be further assessed with CT imaging once the patient's acute issues have resolved. Electronically Signed   By: Fidela Salisbury MD   On: 08/06/2020 05:43   DG Chest Port 1 View  Result Date: 08/04/2020 CLINICAL DATA:  Intubated EXAM: PORTABLE CHEST 1 VIEW COMPARISON:  07/17/2020 FINDINGS: 2 frontal views of the chest demonstrate endotracheal tube overlying tracheal air column tip well above carina. Enteric catheter passes below diaphragm tip and side port project over gastric fundus. Cardiac silhouette is stable. Chronic scarring and volume loss within the left chest. Diffuse emphysema. No airspace disease, effusion, or pneumothorax. IMPRESSION: 1. No complication after intubation. 2. Stable emphysema and bilateral scarring. Electronically Signed   By: Randa Ngo M.D.   On: 08/04/2020 20:06        Scheduled Meds: . azithromycin  250 mg Per Tube Daily  . budesonide (PULMICORT) nebulizer solution  0.25 mg Nebulization BID  . chlorhexidine gluconate (MEDLINE KIT)  15 mL Mouth Rinse BID  . Chlorhexidine Gluconate Cloth  6 each Topical Daily  . dextromethorphan-guaiFENesin  1 tablet Oral BID  . diltiazem  30 mg Per Tube Q6H  . docusate  100 mg Per Tube BID  . furosemide  40 mg Oral BID  . ipratropium  0.5 mg Nebulization Q6H  . levalbuterol  1.25 mg Nebulization Q6H  . LORazepam  1 mg Intravenous Q4H  . mouth rinse  15 mL Mouth Rinse 10 times per day  . methocarbamol  750 mg Oral QID  . methylPREDNISolone (SOLU-MEDROL) injection  60 mg Intravenous  Q6H  . montelukast  10 mg Oral Daily  . pantoprazole  40 mg Oral Daily  . polyethylene glycol  17 g Per Tube Daily  . rifampin  600 mg Oral Daily  . tamsulosin  0.4 mg Oral BID   Continuous Infusions: . sodium chloride 250 mL (08/04/20 2313)  . dextrose 5 % and 0.45% NaCl 75 mL/hr at 08/06/20 1737  . morphine 4 mg/hr (08/06/20 1422)  . propofol (DIPRIVAN) infusion 30 mcg/kg/min (08/06/20 1515)     LOS: 2 days    Critical care procedure note Authorized and performed by: Kathie Dike Total critical care time: Approximately 30 minutes Due to high probability of clinically significant, life-threatening deterioration, the patient required my highest level of preparedness to intervene emergently and I personally spent this critical care time directly and personally managing the patient.  The critical care time included obtaining a history, examining the patient, pulse oximetry, ordering and review of studies, arranging urgent treatment with development of a management plan, evaluation of patient's response to treatment, frequent  reassessment, discussions with other providers.  Critical care time was performed to assess and manage the high probability of imminent, life-threatening deterioration that could result in multiorgan failure.  It was exclusive of separate billable procedures and treating other patients and teaching time.  Please see MDM section and the rest of the of note for further information on patient assessment and treatment     Kathie Dike, MD [facility]   If 7PM-7AM, please contact night-coverage [email or web address]  08/06/2020, 6:08 PM

## 2020-08-06 NOTE — Progress Notes (Signed)
[facility] night shift.  Soft wrist restraints were renewed.  The patient is currently sedated while receiving mechanical ventilation and seems to be in no distress.[name] His most recent vital signs temperature 98.5 F, pulse 61, respirations 24, BP 93/53 mmHg and O2 sat 97% while mechanically ventilated on 65% of FiO2.  Lungs had subtle bilateral wheezing.  Heart S1-S2, RRR, no murmurs.  Abdomen is distended, but soft and nontender.  Extremities, no edema, clubbing or cyanosis.  Tennis Must, MD.

## 2020-08-06 NOTE — Consult Note (Signed)
NAME:  Ryan [name], MRN:  [id number], DOB:  [birth year], LOS: 2 ADMISSION DATE:  07/22/2020, CONSULTATION DATE:  08/06/20 REFERRING MD:  Roderic Palau, [facility], CHIEF COMPLAINT:  Vent dep resp failure   Brief History:  4 yowm last seen by our service when  quit smoking in 2016 with then GOLD II/III copd c/b chronic 02 dep resp failure @ 3lpm  and MAI followed by Dr Linus Salmons and admitted 1/21 with acute on chronic hypoxemic/ hypercarbic resp failure and tried on BIPAP but vomited and required intubation 1/22 pm and PCCM service consulted am 1/24 for vent management   History of Present Illness:  60 y.o. male with medical history significant of paroxysmal atrial fibrillation not on anticoagulation, barrett's  esophagus, bronchitis, CAP, cavitary lesion of lung growing MAI, chronic back pain, chronic diastolic heart failure, chronic left shoulder and neck pain, collagenous vascular disease, CAD, GERD, hyperlipidemia, polycythemia, remote seizures, type 2 diabetes mellitus, former smoker, pulmonary nodule, pulmonary fibrosis, COPD on home oxygen who is coming to the emergency department due to progressively worse dyspnea for the past 2 days.  According to EMS the patient had already used 8 bottles home oxygen tanks during the day before they arrived.  They treated him with NRB oxygen, 2 DuoNebs, magnesium sulfate 2 g and Solu-Medrol 125 mg IVP.  The patient was placed on BiPAP ventilation and unable to provide full information while wearing BiPAP.  He denied fever, chills, renal area, headache, sore throat, chest pain, but c/o   feeling very fatigued and having epigastric/RUQ pain since earlier in the day.  He denied diarrhea, constipation, melena or hematochezia.  No dysuria, frequency or hematuria.  No polyuria, polydipsia, polyphagia or blurred vision.  ED Course: Initial vital signs were temperature 98.4 F, pulse 119, respirations 32, BP 154/122 mmHg O2 sat 92% on NRB oxygen.  The patient was started on BiPAP  ventilation in the ED.  Lab work: ABG showed pH of 7.341, PCO2 54.5 and PO2 150 mmHg.  Coronavirus PCR was negative.  CBC showed a white count 9.1, hemoglobin 9.2 g/dL (baseline 9-10) and platelets 255.  Troponin was negative twice.  BNP was 74.0 pg/mL.  Lipase was normal.  CMP showed a sodium 133 and chloride 93 mmol/L.  Glucose 263, calcium 8.0 and total bilirubin 0.2 mg/dL.  Albumin was 2.9 g/dL.  The rest of the CMP values were normal.  Imaging: A one-view portable chest radiograph showed chronic scarring in the left upper lobe and bases, but no acute infiltrate was seen  Past Medical History:   Past Medical History:  Diagnosis Date  . Atrial fibrillation ([place])    Not anticoagulated  . Barrett's esophagus   . Borderline diabetes   . Bronchitis   . CAP (community acquired pneumonia) 07/10/2013   04/2015  . 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 Chronic back pain   . Chronic diastolic CHF (congestive heart failure) ([place])   . Chronic left shoulder pain   . Chronic neck pain   . Chronic respiratory failure ([place])   . Collagen vascular disease ([place])   . COPD (chronic obstructive pulmonary disease) ([place])   . Coronary atherosclerosis of native coronary artery    Mild nonobstructive disease at catheterization 2007  . DDD (degenerative disc disease)    Cervical and thoracic  . Diastolic heart failure ([place])   . GERD (gastroesophageal reflux disease)   . History of pneumonia   . Hypercholesteremia   .  On home O2    6L N/C  . Polycythemia   . Pulmonary fibrosis ([place]) 05/10/2015   For f/u HRCT 11/01/14    . Pulmonary nodule    F/u CT ordered for 11/01/15    . Seizures ([place])    Last seizure 2 yrs ago  . Smoker   . Type 2 diabetes mellitus ([place])   . Type 2 diabetes mellitus without complication ([place])      Significant Hospital Events:     Consults:  PCCM 1/24 Palliative care 1/24   Procedures:  ET  1/22 >>>  Significant Diagnostic Tests:   CT abd and pelvis with contrast 1/23 1. No acute intra-abdominal or pelvic pathology. No bowel obstruction. Normal appendix. 2. Small scattered sigmoid diverticula. 3. Aortic Atherosclerosis (ICD10-I70.0) and Emphysema (ICD10-J43.9  Micro Data:  Covid 19 PCR 1/21 neg   Antimicrobials:   Rocphin 1/21 only Unasyn 1/21 >>> Rifampin 1/21 >>> Zmax  1/23 >>>   Scheduled Meds: . azithromycin  250 mg Per Tube Daily  . benzonatate  200 mg Oral TID  . budesonide (PULMICORT) nebulizer solution  0.25 mg Nebulization BID  . chlorhexidine gluconate (MEDLINE KIT)  15 mL Mouth Rinse BID  . Chlorhexidine Gluconate Cloth  6 each Topical Daily  . citalopram  20 mg Oral Daily  . dextromethorphan-guaiFENesin  1 tablet Oral BID  . diltiazem  30 mg Per Tube Q6H  . docusate  100 mg Per Tube BID  . enoxaparin (LOVENOX) injection  40 mg Subcutaneous Q24H  . feeding supplement  237 mL Oral TID BM  . furosemide  40 mg Oral BID  . insulin aspart  0-9 Units Subcutaneous Q4H  . insulin glargine  40 Units Subcutaneous Daily  . ipratropium  0.5 mg Nebulization Q6H  . levalbuterol  1.25 mg Nebulization Q6H  . mouth rinse  15 mL Mouth Rinse 10 times per day  . methocarbamol  750 mg Oral QID  . methylPREDNISolone (SOLU-MEDROL) injection  60 mg Intravenous Q6H  . montelukast  10 mg Oral Daily  . multivitamin with minerals  1 tablet Oral Daily  . pantoprazole  40 mg Oral Daily  . polyethylene glycol  17 g Per Tube Daily  . rifampin  600 mg Oral Daily  . tamsulosin  0.4 mg Oral BID   Continuous Infusions: . sodium chloride 250 mL (08/04/20 2313)  . ampicillin-sulbactam (UNASYN) IV 3 g (08/06/20 0246)  . dextrose 5 % and 0.45% NaCl 75 mL/hr at 08/06/20 0325  . fentaNYL infusion INTRAVENOUS 150 mcg/hr (08/05/20 1850)  . propofol (DIPRIVAN) infusion 40 mcg/kg/min (08/06/20 0558)   PRN Meds:.acetaminophen **OR** acetaminophen, alum & mag hydroxide-simeth, diphenhydrAMINE, fentaNYL, HYDROcodone-homatropine,  levalbuterol, LORazepam, midazolam, nitroGLYCERIN, oxyCODONE-acetaminophen **AND** oxyCODONE, prochlorperazine   Interim History / Subjective:  Very uncomfortable on vent despite fent/diprovan drips  Objective   Blood pressure 114/62, pulse 98, temperature (!) 97.5 F (36.4 C), temperature source Axillary, resp. rate (!) 26, height 5' 8" (1.727 m), weight 66.9 kg, SpO2 98 %.    Vent Mode: PRVC FiO2 (%):  [65 %-75 %] 65 % Set Rate:  [24 bmp] 24 bmp Vt Set:  [24 mL-540 mL] 540 mL PEEP:  [5 cmH20] 5 cmH20 Plateau Pressure:  [10 cmH20-30 cmH20] 28 cmH20   Intake/Output Summary (Last 24 hours) at 08/06/2020 0845 Last data filed at 08/06/2020 0350 Gross per 24 hour  Intake 2825.33 ml  Output 1850 ml  Net 975.33 ml   Filed Weights   08/04/20 1523 08/05/20  0345 08/06/20 0349  Weight: 80.7 kg 80.1 kg 66.9 kg    Examination: Tmax 98.9  General: acute and chronically ill appearing HENT: og/ et Lungs: very distant rhonchi, clearly air trapping on vent graphics Cardiovascular: RRR no s3 Abdomen: mod/severely distended, gen tenderness, decreased bs Extremities: warm, trace pitting LE Neuro: sedated   I personally reviewed images and   impression as follows:  CXR:   Portable 08/06/2020 ET ok, no acute as dz   Resolved Hospital Problem list      Assessment & Plan:  1)  Acute on chronic hypoxemic and hypercarbic resp failure in setting of likely endstage copd c/b MAI and now likely asp pna and severe air trapping on vent  - Clearly the high likelihood of prolonging suffering from pulmonary interventions vastly outweighs any reasonable chance of benefit from offering anything else because medical science has done all it can here to restore health.  Therefore  I don't have any additional recs  except to consider hospice sooner rather than later - paradoxically many patients with respiratory diseases live longer and better once a palliative approach is used in this setting.   >>>  palliative care consult pending    2) Abd distention with neg CT abd c/w ileus, problematic for the use of opioids  >> ? precedex trial worth considering ?    3) likely anemia of chronic dz   4) protein calorie malnutrition with low albumin / chronically w/c dependent with muscle atrophy > very debilitated      Best practice (evaluated daily)  Diet : Per [facility]  Pain/Anxiety/Delirium protocol (if indicated): per [facility] VAP protocol (if indicated):  DVT prophylaxis: per [facility] GI prophylaxis: per triad Glucose control: per triad Mobility:  SBR Disposition:ICU  Goals of Care:  Last date of multidisciplinary goals of care discussion:per triad  Family and staff present: wife at bedside Summary of discussion:  Reviewed limits of what medical science can do here. Her goal is to let him die at home. Follow up goals of care discussion due:  Code Status: Full code for now but strongly favor ncb/ full comfort mode before attempting wean  Labs   CBC: Recent Labs  Lab 08/01/2020 1831 08/05/20 0444 08/06/20 0731  WBC 9.1 5.8 8.4  NEUTROABS 8.2*  --   --   HGB 9.2* 9.2* 9.0*  HCT 30.1* 30.9* 30.9*  MCV 80.9 80.3 81.7  PLT 255 252 700    Basic Metabolic Panel: Recent Labs  Lab 08/05/2020 1831 08/05/20 0444 08/06/20 0731  NA 133* 139 136  K 4.4 4.0 3.9  CL 93* 98 95*  CO2 _0 GLUCOSE 263* 105* 120*  BUN _1 CREATININE 1.01 1.15 1.00  CALCIUM 8.0* 8.3* 7.9*   GFR: Estimated Creatinine Clearance: 73.4 mL/min (by C-G formula based on SCr of 1 mg/dL). Recent Labs  Lab 07/15/2020 1831 08/05/20 0444 08/06/20 0731  WBC 9.1 5.8 8.4    Liver Function Tests: Recent Labs  Lab 08/05/2020 1831 08/06/20 0731  AST 18 30  ALT 19 20  ALKPHOS 48 42  BILITOT 0.2* 0.4  PROT 6.6 6.6  ALBUMIN 2.9* 2.6*   Recent Labs  Lab 08/06/2020 1831  LIPASE 16   No results for input(s): AMMONIA in the last 168 hours.  ABG    Component Value Date/Time   PHART 7.459 (H)  08/06/2020 0227   PCO2ART 43.9 08/06/2020 0227   PO2ART 77.1 (L) 08/06/2020 0227   HCO3 30.4 (H)  08/06/2020 0227   TCO2 23 08/17/2015 1118   ACIDBASEDEF 0.4 09/09/2014 2154   O2SAT 94.4 08/06/2020 0227     Coagulation Profile: No results for input(s): INR, PROTIME in the last 168 hours.  Cardiac Enzymes: No results for input(s): CKTOTAL, CKMB, CKMBINDEX, TROPONINI in the last 168 hours.  HbA1C: Hgb A1C (fingerstick)  Date/Time Value Ref Range Status  05/29/2014 12:17 PM 6.4 (H) <5.7 % Final    Comment:                                                                           According to the ADA Clinical Practice Recommendations for 2011, when HbA1c is used as a screening test:     >=6.5%   Diagnostic of Diabetes Mellitus            (if abnormal result is confirmed)   5.7-6.4%   Increased risk of developing Diabetes Mellitus   References:Diagnosis and Classification of Diabetes Mellitus,Diabetes ZOXW,9604,54(UJWJX 1):S62-S69 and Standards of Medical Care in         Diabetes - 2011,Diabetes BJYN,8295,62 (Suppl 1):S11-S61.      Hgb A1c MFr Bld  Date/Time Value Ref Range Status  08/04/2020 06:13 AM 9.4 (H) 4.8 - 5.6 % Final    Comment:    (NOTE) Pre diabetes:          5.7%-6.4%  Diabetes:              >6.4%  Glycemic control for   <7.0% adults with diabetes   08/08/2019 08:28 AM 8.3 (H) 4.8 - 5.6 % Final    Comment:    (NOTE) Pre diabetes:          5.7%-6.4% Diabetes:              >6.4% Glycemic control for   <7.0% adults with diabetes     CBG: Recent Labs  Lab 08/05/20 1639 08/05/20 1928 08/05/20 2325 08/06/20 0330 08/06/20 0747  GLUCAP 68* 101* 138* 158* 132*       Past Medical History:  He,  has a past medical history of Atrial fibrillation ([place]), Barrett's esophagus, Borderline diabetes, Bronchitis, CAP (community acquired pneumonia) (07/10/2013), Cavitary lesion of lung (05/07/2011), Chronic back pain, Chronic diastolic CHF (congestive heart  failure) ([place]), Chronic left shoulder pain, Chronic neck pain, Chronic respiratory failure ([place]), Collagen vascular disease ([place]), COPD (chronic obstructive pulmonary disease) ([place]), Coronary atherosclerosis of native coronary artery, DDD (degenerative disc disease), Diastolic heart failure ([place]), GERD (gastroesophageal reflux disease), History of pneumonia, Hypercholesteremia, On home O2, Polycythemia, Pulmonary fibrosis ([place]) (05/10/2015), Pulmonary nodule, Seizures ([place]), Smoker, Type 2 diabetes mellitus ([place]), and Type 2 diabetes mellitus without complication ([place]).   Surgical History:   Past Surgical History:  Procedure Laterality Date  . COLONOSCOPY  2012   Dr. Posey Pronto: normal  . ESOPHAGOGASTRODUODENOSCOPY N/A 05/04/2015   Dr. Michail Sermon: minimal erosive esophagitis, small hiatal hernia. FOOD PRECLUDED A COMPLETE EXAM. No biopsies taken  . ESOPHAGOGASTRODUODENOSCOPY  2011   [place]: Barrett's   . ESOPHAGOGASTRODUODENOSCOPY (EGD) WITH PROPOFOL N/A 05/06/2018   Dr. Gala Romney: erosive reflux esophagitis and +candida, Schatzki's ring s/p dilation, hiatal hernia.  [name] LUNG BIOPSY    . MALONEY DILATION N/A 05/06/2018   Procedure:  MALONEY DILATION;  Surgeon: Daneil Dolin, MD;  Location: [facility];  Service: Endoscopy;  Laterality: N/A;  . Throat biopsy    . VASECTOMY    . VASECTOMY  1987     Social History:   reports that he quit smoking about 5 years ago. His smoking use included cigarettes. He has a 22.50 pack-year smoking history. He has never used smokeless tobacco. He reports that he does not drink alcohol and does not use drugs.   Family History:  His family history includes Diabetes in his mother and sister; Heart attack in his mother; Heart failure in his mother and sister; Hypertension in his mother and sister. There is no history of Colon cancer.   Allergies Allergies  Allergen Reactions  . Albuterol Palpitations    Causes pt to have a-fib   . Ciprofloxacin Other (See  Comments)    Patient experiences vision loss from long-term and short-term use  . Influenza Vaccine Live Swelling     Home Medications  Prior to Admission medications - - NOTE:   Unable to verify as accurately reflecting what pt takes     Medication Sig Start Date End Date Taking? Authorizing Provider  alfuzosin (UROXATRAL) 10 MG 24 hr tablet Take 10 mg by mouth daily with breakfast.   Yes [provider]  AMBULATORY NON FORMULARY MEDICATION Take 100 mg by mouth daily. Medication Name: clofazimine Patient taking differently: Take 200 mg by mouth daily. Medication Name: clofazimine 06/18/20  Yes Kuppelweiser, Cassie L, RPH-CPP  azithromycin (ZITHROMAX) 250 MG tablet Take 250 mg by mouth as directed. 08/02/20  Yes [provider]  budesonide (PULMICORT) 0.25 MG/2ML nebulizer solution Take 2 mLs (0.25 mg total) by nebulization 2 (two) times daily. Patient taking differently: Take 0.25 mg by nebulization 4 (four) times daily. 10/24/17  Yes [name], MD  citalopram (CELEXA) 20 MG tablet Take 1 tablet (20 mg total) by mouth daily. 05/30/19  Yes [name], MD  diltiazem [facility]) 180 MG 24 hr capsule Take 180 mg by mouth daily.   Yes [provider]  diphenhydrAMINE (BENADRYL) 25 mg capsule Take 25 mg by mouth every 8 (eight) hours as needed. Felt mouth swelling at ED visit 08/16/18.   Yes [provider]  fexofenadine (ALLEGRA) 180 MG tablet Take 180 mg by mouth daily.   Yes [provider]  fluticasone (FLONASE) 50 MCG/ACT nasal spray Place 2 sprays into both nostrils daily as needed.  02/24/18  Yes [provider]  Fluticasone-Umeclidin-Vilant 100-62.5-25 MCG/INH AEPB Inhale 1 puff into the lungs daily.   Yes [provider]  furosemide (LASIX) 40 MG tablet Take 1 tablet (40 mg total) by mouth 2 (two) times daily. As needed for swelling 05/29/19 05/28/20 Yes [name], MD  guaiFENesin (MUCINEX) 600 MG 12 hr tablet Take 600  mg by mouth 2 (two) times daily.   Yes [provider]  HYDROcodone-homatropine (HYCODAN) 5-1.5 MG/5ML syrup Take 5 mLs by mouth every 6 (six) hours as needed for cough.   Yes [provider]  insulin glargine (LANTUS) 100 UNIT/ML injection Inject 0.6 mLs (60 Units total) into the skin daily. 24 units daily 05/29/19  Yes [name], MD  insulin lispro (HUMALOG) 100 UNIT/ML injection Take by sliding scale provided by hospital maximum amount daily is 60 units.  Check blood sugar 4 times a day Patient taking differently: Inject 0-15 Units into the skin 3 (three) times daily with meals. Take by sliding scale provided by hospital  maximum amount daily is 60 units.  Check blood sugar 4 times a day 09/23/17  Yes [name], MD  ipratropium (ATROVENT) 0.02 % nebulizer solution Take 0.5 mg by nebulization every 6 (six) hours as needed.  09/29/17  Yes [provider]  levalbuterol Penne Lash) 0.63 MG/3ML nebulizer solution Take 0.63 mg by nebulization every 6 (six) hours as needed.    Yes [provider]  LORazepam (ATIVAN) 1 MG tablet Take 1 mg by mouth 4 (four) times daily as needed. 07/19/20  Yes [provider]  metFORMIN (GLUCOPHAGE) 500 MG tablet Take 1 tablet (500 mg total) by mouth daily with breakfast. 09/15/14  Yes Rexene Alberts, MD  methocarbamol (ROBAXIN) 750 MG tablet Take 750 mg by mouth 4 (four) times daily. 07/10/20  Yes [provider]  montelukast (SINGULAIR) 10 MG tablet Take 1 tablet by mouth daily. 02/17/19  Yes [provider]  nitroGLYCERIN (NITROSTAT) 0.4 MG SL tablet Place 1 tablet (0.4 mg total) under the tongue every 5 (five) minutes as needed for chest pain. 04/22/13  Yes Lendon Colonel, NP  omeprazole (PRILOSEC) 20 MG capsule TAKE 1 CAPSULE BY MOUTH 2 TIMES DAILY BEFORE A MEAL 09/28/19  Yes Mahala Menghini, PA-C  oxyCODONE-acetaminophen (PERCOCET) 10-325 MG tablet Take 1 tablet by mouth every 4 (four) hours as needed  for pain.   Yes [provider]  potassium chloride SA (KLOR-CON) 20 MEQ tablet Take 1 tablet once daily while on Lasix 08/14/19  Yes Emokpae, Courage, MD  predniSONE (DELTASONE) 10 MG tablet Take 1 tablet (10 mg total) by mouth See admin instructions. Take 40 mg with breakfast daily for 4 days then 30 mg daily for 4 days then 20 mg daily for 4 days 10 mg daily indefinitely-always take with food 08/14/19  Yes Emokpae, Courage, MD  rifampin (RIFADIN) 300 MG capsule Take 600 mg by mouth daily. 07/16/20  Yes [provider]  tamsulosin (FLOMAX) 0.4 MG CAPS capsule Take 1 capsule (0.4 mg total) by mouth 2 (two) times daily. 09/19/18  Yes Roxan Hockey, MD  acetaminophen (TYLENOL) 325 MG tablet Take 2 tablets (650 mg total) by mouth every 6 (six) hours as needed for mild pain, moderate pain, fever or headache (or Fever >/= 101). Patient not taking: Reported on 07/20/2020 09/19/18   Roxan Hockey, MD  BD PEN NEEDLE NANO 2ND GEN 32G X 4 MM MISC INJECT UP TO 5 TIMES DAILY 06/29/19   [provider]  benzonatate (TESSALON) 200 MG capsule Take 200 mg by mouth 3 (three) times daily.    [provider]  CARTIA XT 120 MG 24 hr capsule Take 1 capsule (120 mg total) by mouth daily. 01/01/18   Arnoldo Lenis, MD  LORazepam (ATIVAN) 0.5 MG tablet Take 1 tablet (0.5 mg total) by mouth 2 (two) times daily as needed for anxiety or sleep. Patient not taking: Reported on 07/15/2020 05/04/15   Cristal Ford, DO  oxymetazoline (AFRIN) 0.05 % nasal spray Place 1 spray into both nostrils 2 (two) times daily. Patient not taking: Reported on 07/24/2020    [provider]  rosuvastatin (CRESTOR) 10 MG tablet Take 10 mg by mouth every morning.  Patient not taking: Reported on 08/10/2020 04/10/18   [provider]  SYMBICORT 160-4.5 MCG/ACT inhaler INL 2 PFS PO BID Patient not taking: Reported on 07/31/2020 09/23/18   [provider]      The patient is critically ill  with multiple organ systems failure and requires high  complexity decision making for assessment and support, frequent evaluation and titration of therapies, application of advanced monitoring technologies and extensive interpretation of multiple databases. Critical Care Time devoted to patient care services described in this note is 45 minutes.   Christinia Gully, MD Pulmonary and [place] [phone]   After 7:00 pm call Elink  [phone]

## 2020-08-07 DIAGNOSIS — Z515 Encounter for palliative care: Secondary | ICD-10-CM | POA: Diagnosis not present

## 2020-08-07 DIAGNOSIS — J441 Chronic obstructive pulmonary disease with (acute) exacerbation: Secondary | ICD-10-CM | POA: Diagnosis not present

## 2020-08-07 DIAGNOSIS — R0603 Acute respiratory distress: Secondary | ICD-10-CM | POA: Diagnosis not present

## 2020-08-07 DIAGNOSIS — E440 Moderate protein-calorie malnutrition: Secondary | ICD-10-CM | POA: Diagnosis not present

## 2020-08-07 DIAGNOSIS — I5032 Chronic diastolic (congestive) heart failure: Secondary | ICD-10-CM | POA: Diagnosis not present

## 2020-08-07 DIAGNOSIS — J9622 Acute and chronic respiratory failure with hypercapnia: Secondary | ICD-10-CM | POA: Diagnosis not present

## 2020-08-07 DIAGNOSIS — J9621 Acute and chronic respiratory failure with hypoxia: Secondary | ICD-10-CM | POA: Diagnosis not present

## 2020-08-07 LAB — GLUCOSE, CAPILLARY: Glucose-Capillary: 160 mg/dL — ABNORMAL HIGH (ref 70–99)

## 2020-08-07 MED ORDER — PROPOFOL 1000 MG/100ML IV EMUL
2.0000 ug/kg/min | INTRAVENOUS | Status: DC
  Administered 2020-08-07: 1 ug/kg/min via INTRAVENOUS
  Administered 2020-08-08: 2 ug/kg/min via INTRAVENOUS
  Filled 2020-08-07: qty 100

## 2020-08-07 MED ORDER — HYDROMORPHONE BOLUS VIA INFUSION
2.0000 mg | INTRAVENOUS | Status: DC | PRN
  Filled 2020-08-07: qty 2

## 2020-08-07 MED ORDER — LORAZEPAM 2 MG/ML IJ SOLN
4.0000 mg | INTRAMUSCULAR | Status: DC | PRN
  Administered 2020-08-07: 4 mg via INTRAVENOUS
  Filled 2020-08-07: qty 2

## 2020-08-07 MED ORDER — LORAZEPAM 2 MG/ML IJ SOLN
2.0000 mg | INTRAMUSCULAR | Status: DC | PRN
  Administered 2020-08-07: 2 mg via INTRAVENOUS

## 2020-08-07 MED ORDER — HYDROMORPHONE HCL PF 10 MG/ML IJ SOLN
INTRAMUSCULAR | Status: AC
  Filled 2020-08-07: qty 5

## 2020-08-07 MED ORDER — LORAZEPAM 2 MG/ML IJ SOLN: 2.0000 mg | INTRAMUSCULAR | Status: DC

## 2020-08-07 MED ORDER — GLYCOPYRROLATE 0.2 MG/ML IJ SOLN
0.4000 mg | INTRAMUSCULAR | Status: AC
  Administered 2020-08-07: 0.4 mg via INTRAVENOUS

## 2020-08-07 MED ORDER — HYDROMORPHONE HCL 1 MG/ML IJ SOLN
2.0000 mg | INTRAMUSCULAR | Status: DC | PRN
  Administered 2020-08-07: 2 mg via INTRAVENOUS
  Filled 2020-08-07 (×2): qty 2

## 2020-08-07 MED ORDER — METHYLPREDNISOLONE SODIUM SUCC 125 MG IJ SOLR: 60.0000 mg | Freq: Two times a day (BID) | INTRAMUSCULAR | Status: DC

## 2020-08-07 MED ORDER — LORAZEPAM 2 MG/ML IJ SOLN
4.0000 mg | INTRAMUSCULAR | Status: DC
  Administered 2020-08-07 – 2020-08-08 (×4): 4 mg via INTRAVENOUS
  Filled 2020-08-07 (×5): qty 2

## 2020-08-07 MED ORDER — SODIUM CHLORIDE 0.9 % IV SOLN
3.0000 mg/h | INTRAVENOUS | Status: DC
  Administered 2020-08-07 – 2020-08-08 (×3): 4 mg/h via INTRAVENOUS
  Filled 2020-08-07: qty 2.5

## 2020-08-07 MED ORDER — GLYCOPYRROLATE 0.2 MG/ML IJ SOLN
0.6000 mg | INTRAMUSCULAR | Status: DC
  Administered 2020-08-07 – 2020-08-08 (×3): 0.6 mg via INTRAVENOUS
  Filled 2020-08-07 (×3): qty 3

## 2020-08-07 MED ORDER — PROPOFOL 1000 MG/100ML IV EMUL: 2.0000 ug/kg/min | INTRAVENOUS | Status: DC

## 2020-08-07 NOTE — Progress Notes (Signed)
Palliative:  HPI: 59 y.o. male  with past medical history of atrial fibrillation not on anticoagulation, diastolic CHF, CAD, HLD, Barrett's esophagus, CAP, cavitary lesion of lung growing MAI, COPD on home oxygen, diabetes, chronic back pain, GERD, remote seizures admitted on 08/02/2020 with shortness of breath due to COPD exacerbation and requiring BiPAP initially and he vomited on BiPAP and required intubation 08/04/20. Per notes he has ben difficult to sedate on ventilator and attempts at self extubation and still requiring 65% FiO2. Noted GOC document in [facility] completed by Carroll Spinks noting conversation with family regarding desire for full aggressive care with hopes of more time with family and "if doctors determine that he will not recover to a reasonable quality of life he would want his wife to make decisions at that time to continue or discontinue life support."   I met today at Mr. Pegg's bedside but no family present. He is resting comfortably on ventilator. I was later on unit when Mr. Ketchum had self extubated. He was having acute distress that with minimal relief from high dose morphine and ativan boluses. He had bolus of dilaudid that seemed to provide better relief but still very labored although RR down from 40s to 20s and he appears more sleepy. I added back small dose of propofol as he has tightness in breathing and lungs are providing little to no effort. Will transition morphine infusion to dilaudid infusion as this seemed more effective and continue low dose propofol.   Wife was called immediately after extubation. I was unable to reach her at first but eventually we were able to reach her and she came to bedside while we were still working to get him more comfortable. Emotional support provided. Full comfort care at this stage with expectation that prognosis is hours to 1-2 days most likely. Daughter now at bedside when I returned later and he does appear more comfortable. Other family  expected to arrive later. Discussed prognosis with family and further emotional support.   All questions/concerns addressed. Emotional support provided. Discussed with Dr. Memon and RN Lawrence.   Exam: Self extubation and on HFNC. Extremely labored breathing with accessory muscle use and distress - eventually with some relief with high dose medication. Tachycardic 120-130s. Warm to touch.   Plan: - Self extubated with full comfort care.  - Respiratory distress:  - Transition morphine 10 mg/hr to dialudid 3 mg/hr for improved relief.   - Continue propofol at low dose 2 mcg/kg/min. May increase to max 5 mcg/kg/min if needed as this also seemed to provide most relief along with dilaudid.   - Continue scheduled and as needed Ativan.   75 min  Alicia Parker, NP Palliative Medicine Team Pager [phone] (Please see [email or web address] for schedule) Team Phone [phone]    Greater than 50%  of this time was spent counseling and coordinating care related to the above assessment and plan  

## 2020-08-07 NOTE — Progress Notes (Signed)
Spoke with daughter who was in room with patient at time of me giving patient his breathing treatment.  I asked daughter if the family still wanted patient to continue to receive treatments, and daughter shook her head yes.

## 2020-08-07 NOTE — Progress Notes (Signed)
PROGRESS NOTE    Ryan [name]  FGH:[id number] DOB: 26-Oct-1960 DOA: 07/30/2020 PCP: Emelia Loron, NP    Brief Narrative:  60 year old male with a history of paroxysmal atrial fibrillation, cavitary lesion growing MAI, diastolic heart failure, COPD on home oxygen, presents to the emergency room with worsening shortness of breath.  Found to have COPD exacerbation.  Initially required nonrebreather and BiPAP in the emergency room.  Started on steroids, antibiotics and bronchodilators. Overall respiratory status deteriorated and he was intubated on 1/22.   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  quit smoking 05/01/15    COPD exacerbation (HCC)   PAF (paroxysmal atrial fibrillation) (HCC)   Hypochromic anemia   Chronic diastolic CHF (congestive heart failure) (HCC)   GERD (gastroesophageal reflux disease)   Moderate protein malnutrition (HCC)   Acute on chronic respiratory failure with hypoxia and hypercapnia -Wife reports that he is chronically on 8L oxygen at home via concentrator -Patient was initially placed on nonrebreather mask and subsequently BiPAP in the emergency room -Unfortunately, his condition began to decline -he began vomiting while having the bipap mask on -overall wheezing and shortness of breath worsened -he was intubated for respiratory distress and to protect airway 1/22 -Likely related to COPD exacerbation -Appreciate pulmonology assistance with vent management -Recommendations were for palliative care/hospice since he is end-stage COPD -seen by palliative care and after [place] discussion with family, decision was made to extubate patient once remainder of family arrives  COPD exacerbation -Currently on intravenous steroids -Continue bronchodilators/inhaled steroids -Continue pulmonary hygiene   Vomiting -patient noted to  have significant abdominal distention -abd xray yesterday did not show any signs of obstruction -CT abdomen does not show any evidence of obstruction  Possible aspiration during vomiting episodes -He was initially started on unasyn, now discontinued after [place] conversation -respiratory is suctioning thick secretions from ET tube  Paroxysmal atrial fibrillation -CHA2DS2-VASc score of at least 2 -On diltiazem for rate control -He has refused anticoagulation  Chronic diastolic congestive heart failure -Currently appears compensated  GERD -Continue on PPI  Anxiety -Continue prn benzodiazepines  History of pulmonary MAI -Chronically on rifampin, azithromycin and clofazimine -wife reports he has been on these meds for about 2 years  BPH -Continue on Flomax twice daily  Diabetes -will avoid checking blood sugars since patient is on comfort measures  Goals of care -Patient has advanced lung disease and overall prognosis is poor -Palliative care met with patient's wife to discuss goals of care -Patient is now DNR and plans are for one-way extubation once remainder family has arrived from out of town -We will continue supportive measures for now   DVT prophylaxis:   Code Status: Full code Family Communication: discussed with wife on 1/25 Disposition Plan: Status is: Inpatient  Remains inpatient appropriate because:Inpatient level of care appropriate due to severity of illness   Dispo: The patient is from: Home              Anticipated d/c is to: TBD              Anticipated d/c date is: 3 days              Patient currently is not medically stable to d/c.   Difficult to place patient No     Consultants:   Pulmonology  Palliative care  Procedures:  ETT 1/22>  Antimicrobials:   Unasyn 1/22 >1/24   Subjective: Patient remains intubated and sedated  Objective: Vitals:   08/07/20 0700 08/07/20 0800 08/07/20 0804 08/07/20 1109  BP:      Pulse: (!) 53      Resp: (!) 24     Temp:      TempSrc:      SpO2: 96% 96% 96% 96%  Weight:      Height:        Intake/Output Summary (Last 24 hours) at 08/07/2020 1147 Last data filed at 08/07/2020 0700 Gross per 24 hour  Intake 1486.47 ml  Output 3000 ml  Net -1513.53 ml   Filed Weights   08/04/20 1523 08/05/20 0345 08/06/20 0349  Weight: 80.7 kg 80.1 kg 66.9 kg    Examination: General exam: intubated and sedated Respiratory system: Clear to auscultation. Respiratory effort normal. Cardiovascular system:RRR. No murmurs, rubs, gallops. Gastrointestinal system: Abdomen is nondistended, soft and nontender. No organomegaly or masses felt. Normal bowel sounds heard. Central nervous system: unable to assess due to sedation Extremities: No C/C/E, +pedal pulses Skin: No rashes, lesions or ulcers Psychiatry: unable to assess due to sedation      Data Reviewed: I have personally reviewed following labs and imaging studies  CBC: Recent Labs  Lab 08/11/2020 1831 08/05/20 0444 08/06/20 0731  WBC 9.1 5.8 8.4  NEUTROABS 8.2*  --   --   HGB 9.2* 9.2* 9.0*  HCT 30.1* 30.9* 30.9*  MCV 80.9 80.3 81.7  PLT 255 252 024   Basic Metabolic Panel: Recent Labs  Lab 08/01/2020 1831 08/05/20 0444 08/06/20 0731  NA 133* 139 136  K 4.4 4.0 3.9  CL 93* 98 95*  CO2 '28 30 31  ' GLUCOSE 263* 105* 120*  BUN '15 19 15  ' CREATININE 1.01 1.15 1.00  CALCIUM 8.0* 8.3* 7.9*   GFR: Estimated Creatinine Clearance: 73.4 mL/min (by C-G formula based on SCr of 1 mg/dL). Liver Function Tests: Recent Labs  Lab 08/02/2020 1831 08/06/20 0731  AST 18 30  ALT 19 20  ALKPHOS 48 42  BILITOT 0.2* 0.4  PROT 6.6 6.6  ALBUMIN 2.9* 2.6*   Recent Labs  Lab 07/21/2020 1831  LIPASE 16   No results for input(s): AMMONIA in the last 168 hours. Coagulation Profile: No results for input(s): INR, PROTIME in the last 168 hours. Cardiac Enzymes: No results for input(s): CKTOTAL, CKMB, CKMBINDEX, TROPONINI in the last 168  hours. BNP (last 3 results) No results for input(s): PROBNP in the last 8760 hours. HbA1C: No results for input(s): HGBA1C in the last 72 hours. CBG: Recent Labs  Lab 08/06/20 1134 08/06/20 1654 08/06/20 1959 08/06/20 2350 08/07/20 0454  GLUCAP 132* 186* 234* 223* 160*   Lipid Profile: Recent Labs    08/05/20 0444  TRIG 176*   Thyroid Function Tests: No results for input(s): TSH, T4TOTAL, FREET4, T3FREE, THYROIDAB in the last 72 hours. Anemia Panel: No results for input(s): VITAMINB12, FOLATE, FERRITIN, TIBC, IRON, RETICCTPCT in the last 72 hours. Sepsis Labs: No results for input(s): PROCALCITON, LATICACIDVEN in the last 168 hours.  Recent Results (from the past 240 hour(s))  SARS Coronavirus 2 by RT PCR (hospital order, performed in [facility] hospital lab) Nasopharyngeal Nasopharyngeal Swab     Status: None   Collection Time: 07/25/2020  5:25 PM   Specimen: Nasopharyngeal Swab  Result Value Ref Range Status   SARS Coronavirus 2 NEGATIVE NEGATIVE Final    Comment: (NOTE) SARS-CoV-2 target nucleic acids  are NOT DETECTED.  The SARS-CoV-2 RNA is generally detectable in upper and lower respiratory specimens during the acute phase of infection. The lowest concentration of SARS-CoV-2 viral copies this assay can detect is 250 copies / mL. A negative result does not preclude SARS-CoV-2 infection and should not be used as the sole basis for treatment or other patient management decisions.  A negative result may occur with improper specimen collection / handling, submission of specimen other than nasopharyngeal swab, presence of viral mutation(s) within the areas targeted by this assay, and inadequate number of viral copies (<250 copies / mL). A negative result must be combined with clinical observations, patient history, and epidemiological information.  Fact Sheet for Patients:   StrictlyIdeas.no  Fact Sheet for Healthcare  Providers: BankingDealers.co.za  This test is not yet approved or  cleared by the [place] FDA and has been authorized for detection and/or diagnosis of SARS-CoV-2 by FDA under an Emergency Use Authorization (EUA).  This EUA will remain in effect (meaning this test can be used) for the duration of the COVID-19 declaration under Section 564(b)(1) of the Act, 21 U.S.C. section 360bbb-3(b)(1), unless the authorization is terminated or revoked sooner.  Performed at [facility], [address], [place], [place] [address]          Radiology Studies: CT ABDOMEN PELVIS W CONTRAST  Result Date: 08/05/2020 CLINICAL DATA:  60 year old male with abdominal distention, nausea vomiting. Concern for bowel obstruction. EXAM: CT ABDOMEN AND PELVIS WITH CONTRAST TECHNIQUE: Multidetector CT imaging of the abdomen and pelvis was performed using the standard protocol following bolus administration of intravenous contrast. CONTRAST:  147m OMNIPAQUE IOHEXOL 300 MG/ML  SOLN COMPARISON:  CT abdomen pelvis dated 05/29/2019. FINDINGS: Lower chest: Emphysema with areas of atelectasis/scarring. Faint diffuse parenchymal densities may represent atelectasis or sequela of prior atypical infection. No intra-abdominal free air or free fluid. Hepatobiliary: Probable mild fatty liver. No intrahepatic biliary dilatation. The gallbladder is unremarkable. Pancreas: Unremarkable. No pancreatic ductal dilatation or surrounding inflammatory changes. Spleen: Normal in size without focal abnormality. Adrenals/Urinary Tract: The adrenal glands unremarkable. Mild bilateral renal parenchyma atrophy. There is no hydronephrosis on either side. There is symmetric enhancement and excretion of contrast by both kidneys. The visualized ureters appear unremarkable. The urinary bladder is decompressed around a Foley catheter. Stomach/Bowel: An enteric tube is noted with tip in the body of the stomach. There are small  scattered sigmoid diverticula without active inflammatory changes. There is no bowel obstruction or active inflammation. The appendix is normal. Vascular/Lymphatic: Advanced aortoiliac atherosclerotic disease. The IVC is unremarkable. No portal venous gas. There is no adenopathy. Reproductive: The prostate and seminal vesicles are grossly unremarkable. Other: Small umbilical hernia containing a short segment of small bowel without evidence of obstruction or inflammation. Musculoskeletal: Mild degenerative changes of the spine. No acute osseous pathology. IMPRESSION: 1. No acute intra-abdominal or pelvic pathology. No bowel obstruction. Normal appendix. 2. Small scattered sigmoid diverticula. 3. Aortic Atherosclerosis (ICD10-I70.0) and Emphysema (ICD10-J43.9). Electronically Signed   By: AAnner CreteM.D.   On: 08/05/2020 15:35   DG CHEST PORT 1 VIEW  Result Date: 08/06/2020 CLINICAL DATA:  Progressive dyspnea EXAM: PORTABLE CHEST 1 VIEW COMPARISON:  08/04/2020, CT 02/17/2020 FINDINGS: Endotracheal tube seen 2.9 cm above the carina. Nasogastric tube extends into the upper abdomen beyond the margin of the examination. Changes of severe centrilobular emphysema are again identified. Parenchymal scarring within the left apex is unchanged. Left suprahilar rounded opacity appears stable from a immediate prior examination, however,  appears progressed from a prior examination of 02/17/2020 and may represent residual opacity related to the cavitary lesion noted on a prior CT examination. This is not well assessed on this examination. No pneumothorax or pleural effusion. Cardiac size within normal limits. IMPRESSION: Support tubes in expected position. Severe emphysema. Left suprahilar mass, possibly the residua of prior cavitary pneumonia, but indeterminate on this examination. This could be further assessed with CT imaging once the patient's acute issues have resolved. Electronically Signed   By: Fidela Salisbury MD    On: 08/06/2020 05:43        Scheduled Meds: . budesonide (PULMICORT) nebulizer solution  0.25 mg Nebulization BID  . chlorhexidine gluconate (MEDLINE KIT)  15 mL Mouth Rinse BID  . Chlorhexidine Gluconate Cloth  6 each Topical Daily  . dextromethorphan-guaiFENesin  1 tablet Oral BID  . diltiazem  30 mg Per Tube Q6H  . ipratropium  0.5 mg Nebulization Q6H  . levalbuterol  1.25 mg Nebulization Q6H  . LORazepam  1 mg Intravenous Q4H  . mouth rinse  15 mL Mouth Rinse 10 times per day  . methylPREDNISolone (SOLU-MEDROL) injection  60 mg Intravenous Q12H  . polyethylene glycol  17 g Per Tube Daily  . tamsulosin  0.4 mg Oral BID   Continuous Infusions: . sodium chloride 250 mL (08/04/20 2313)  . morphine 4 mg/hr (08/07/20 0456)  . propofol (DIPRIVAN) infusion 30 mcg/kg/min (08/07/20 0851)     LOS: 3 days    Critical care procedure note Authorized and performed by: Kathie Dike Total critical care time: Approximately 30 minutes Due to high probability of clinically significant, life-threatening deterioration, the patient required my highest level of preparedness to intervene emergently and I personally spent this critical care time directly and personally managing the patient.  The critical care time included obtaining a history, examining the patient, pulse oximetry, ordering and review of studies, arranging urgent treatment with development of a management plan, evaluation of patient's response to treatment, frequent reassessment, discussions with other providers.  Critical care time was performed to assess and manage the high probability of imminent, life-threatening deterioration that could result in multiorgan failure.  It was exclusive of separate billable procedures and treating other patients and teaching time.  Please see MDM section and the rest of the of note for further information on patient assessment and treatment     Kathie Dike, MD [facility]   If  7PM-7AM, please contact night-coverage [email or web address]  08/07/2020, 11:47 AM

## 2020-08-07 NOTE — Progress Notes (Signed)
Patient self-extubated. Primary RN and this RN at bedside. Respiratory notified and now at bedside. Palliative NP at bedside to assist with comfort measures. Wife called to come to hospital. Staff at bedside to comfort patient.

## 2020-08-07 NOTE — Progress Notes (Signed)
With Palative care nurses help and orders and Dr. Blythe Stanford consent patient is full comfort care. Family at bedside. Patients has multiple comfort sedative drips given and seems to be resting comfortably. On high flow nasal cannula for comfort.

## 2020-08-08 DIAGNOSIS — I5032 Chronic diastolic (congestive) heart failure: Secondary | ICD-10-CM | POA: Diagnosis not present

## 2020-08-08 DIAGNOSIS — J9621 Acute and chronic respiratory failure with hypoxia: Secondary | ICD-10-CM | POA: Diagnosis not present

## 2020-08-08 DIAGNOSIS — I480 Paroxysmal atrial fibrillation: Secondary | ICD-10-CM | POA: Diagnosis not present

## 2020-08-08 DIAGNOSIS — J441 Chronic obstructive pulmonary disease with (acute) exacerbation: Secondary | ICD-10-CM | POA: Diagnosis not present

## 2020-08-09 IMAGING — CT CT ABD-PELV W/ CM
3 of 5 series · 16 of 46 positions shown, 18 images · IV contrast (Omnipaque or Isovue)
Comparison: 09/17/2018

CLINICAL DATA: Persistent leg swelling, asymmetric, negative venous
ultrasound of the lower extremities, question iliac vein thrombosis

EXAM:
CT ABDOMEN AND PELVIS WITH CONTRAST
TECHNIQUE: Multidetector CT imaging of the abdomen and pelvis was performed
using the standard protocol following bolus administration of
intravenous contrast. Sagittal and coronal MPR images reconstructed
from axial data set.
CONTRAST:  100mL OMNIPAQUE IOHEXOL 300 MG/ML SOLN IV. No oral
contrast administered.

[Series 2: axial st · axial · 0.88mm/px · z∈[-432,-27]mm · 10 of 99 slices shown, 12 images]
[im 9/99  soft-tissue]
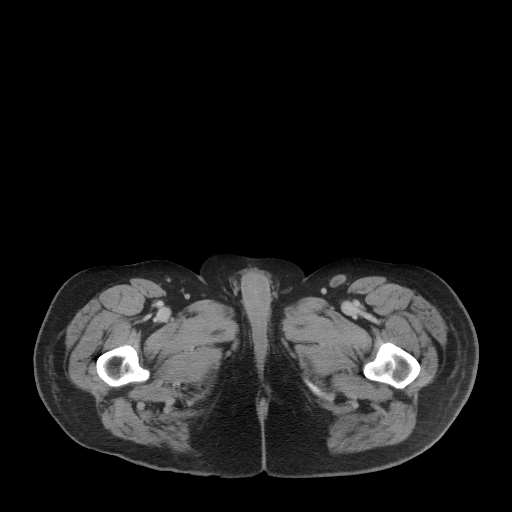
[im 9/99  bone]
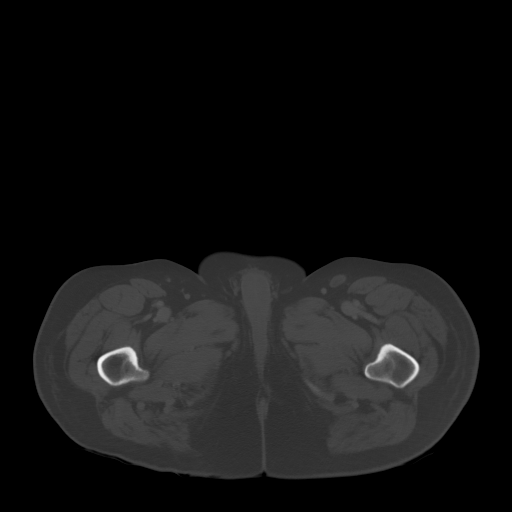
[im 18/99  soft-tissue]
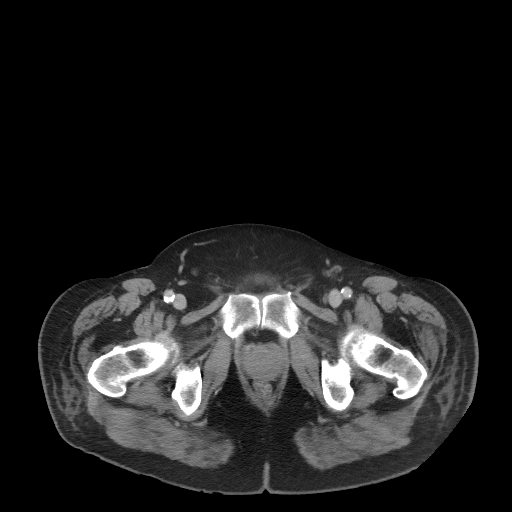
[im 27/99  soft-tissue]
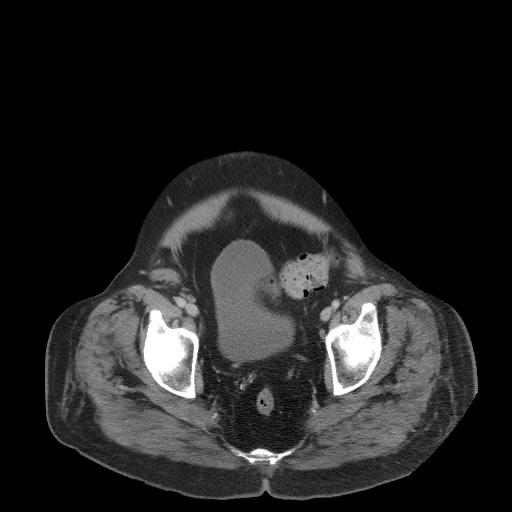
[im 36/99  soft-tissue]
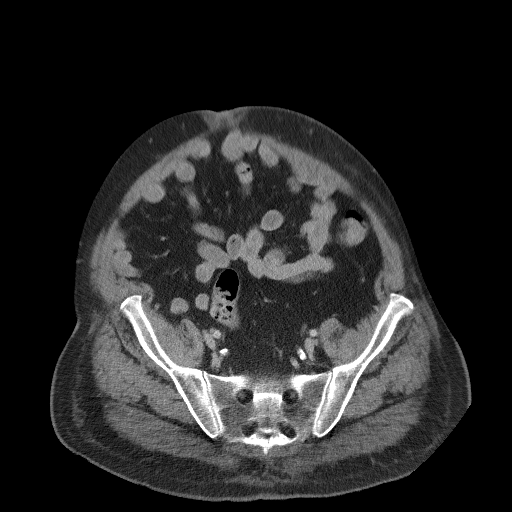
[im 45/99  soft-tissue]
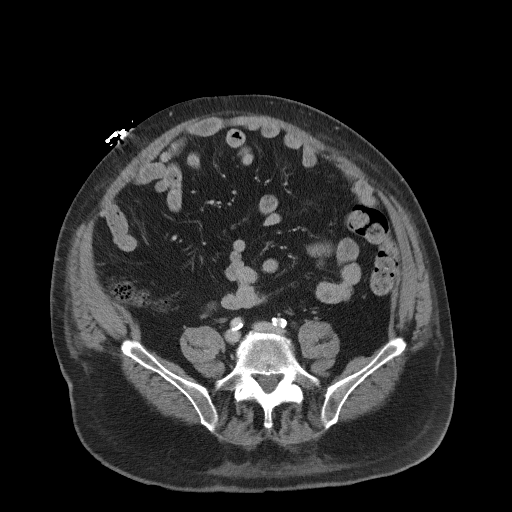
[im 54/99  soft-tissue]
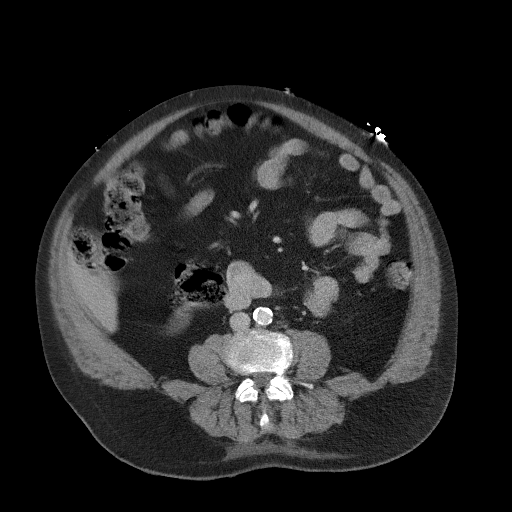
[im 63/99  soft-tissue]
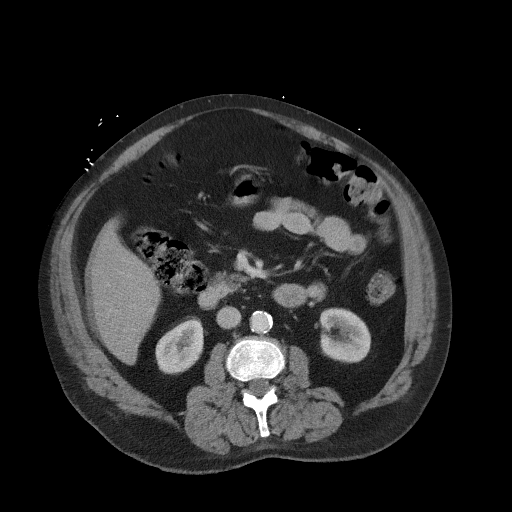
[im 72/99  soft-tissue]
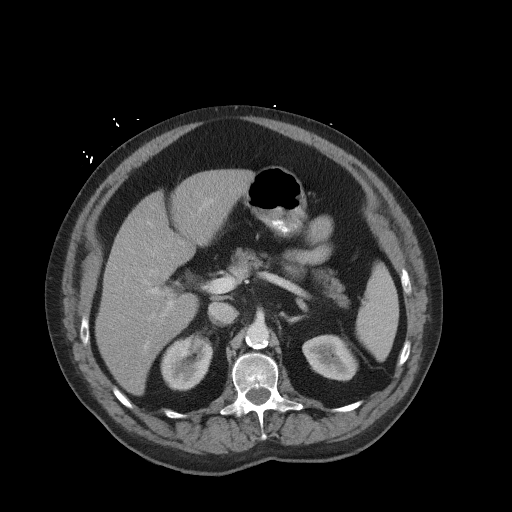
[im 81/99  soft-tissue]
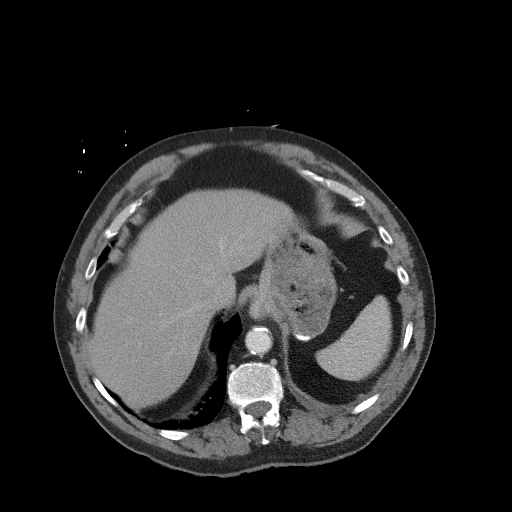
[im 81/99  bone]
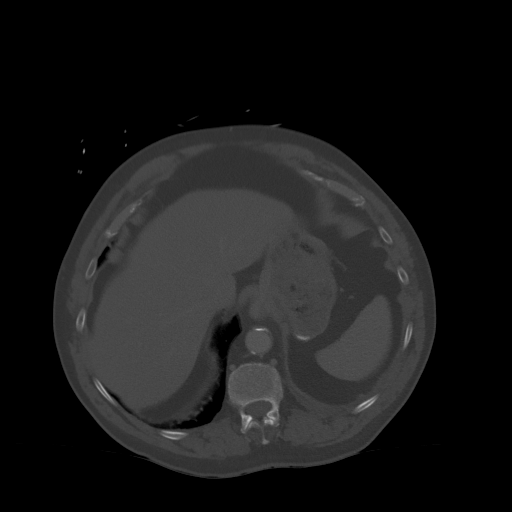
[im 90/99  soft-tissue]
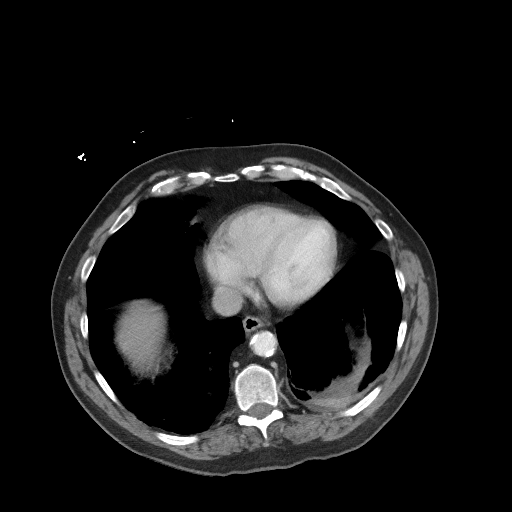

[Series 4: lung bases · axial · 0.88mm/px · z∈[-128,-92]mm · 3 of 83 slices shown]
[im 10/83  bone]
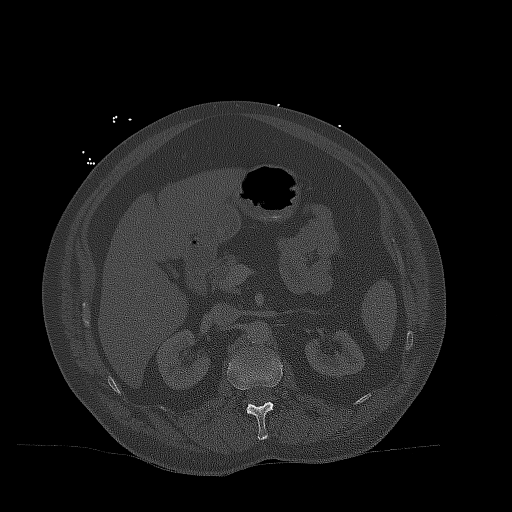
[im 19/83  bone]
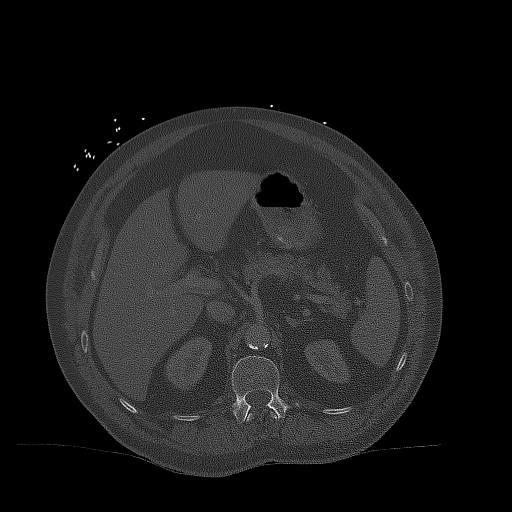
[im 28/83  bone]
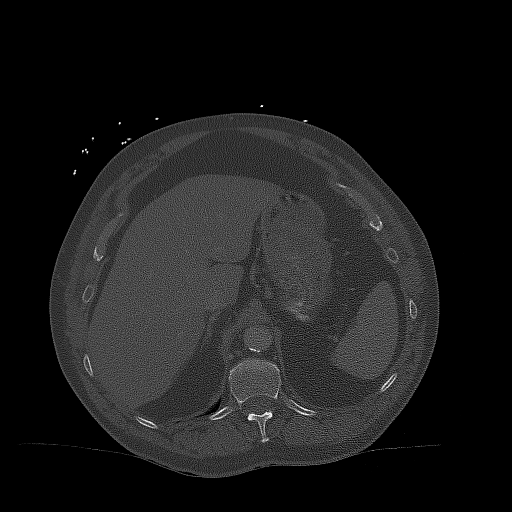

[Series 5: coronal st · coronal · 0.77mm/px · 3 of 130 slices shown]
[im 44/130  soft-tissue]
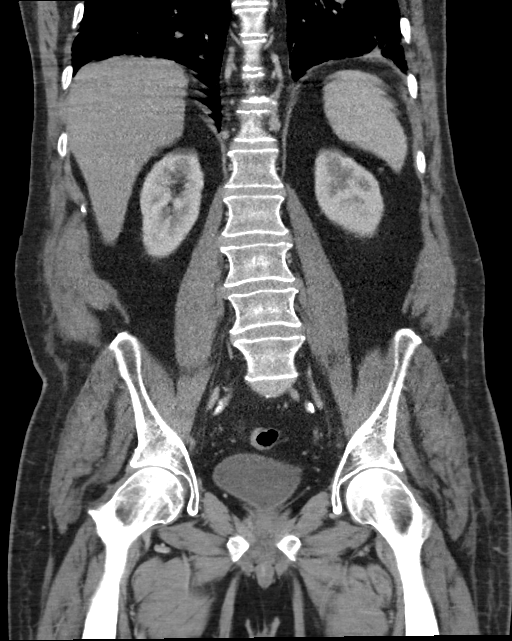
[im 58/130  soft-tissue]
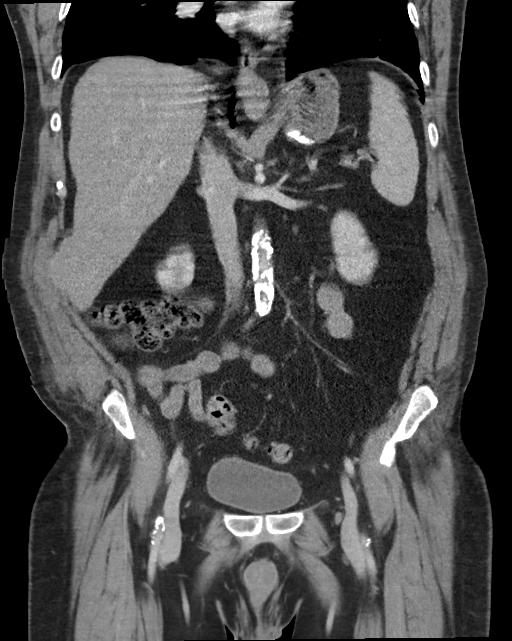
[im 72/130  soft-tissue]
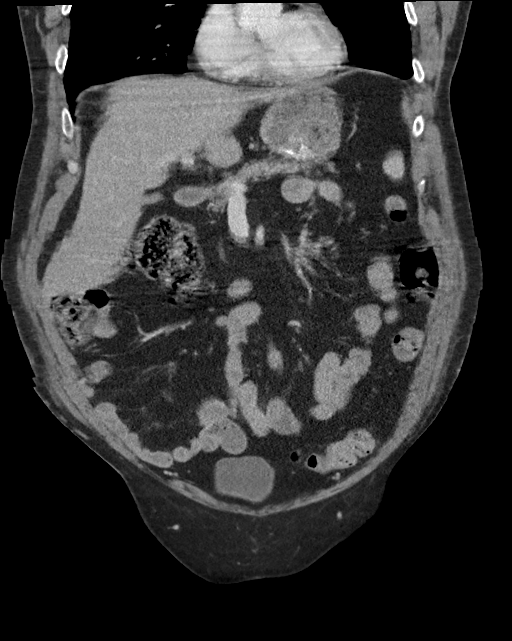

[16 of 46 positions shown; findings below may reference images not displayed]

FINDINGS: Lower chest: Emphysematous changes with bibasilar atelectasis
greater on LEFT and central peribronchial thickening. Nodular
density in lingula 7 mm diameter unchanged. Tiny LEFT pleural
effusion.

Hepatobiliary: Contracted gallbladder.  Liver unremarkable.

Pancreas: Normal appearance

Spleen: Normal appearance

Adrenals/Urinary Tract: Adrenal glands, kidneys, ureters, and
bladder normal appearance

Stomach/Bowel: Appendix not visualized. Nonobstructed small bowel
loop extends into a small umbilical hernia. Stomach and bowel loops
otherwise normal appearance.

Vascular/Lymphatic: Atherosclerotic calcifications aorta and iliac
arteries as well as coronary arteries. Aorta normal caliber. No
adenopathy. IVC, common iliac, external iliac, and visualized common
femoral veins patent through common femoral vein bifurcation. No
deep venous thrombosis identified.

Reproductive: Unremarkable

Other: No free air or free fluid. In addition to small umbilical
hernia containing a nonobstructed small bowel loop, a small LEFT
inguinal hernia is seen containing fat. No inflammatory process.

Musculoskeletal: Unremarkable
IMPRESSION: No evidence of pelvic deep venous thrombosis.

Small umbilical hernia containing a nonobstructed small bowel loop.

Small LEFT inguinal hernia containing fat.

Stable changes of COPD and scarring at lung bases with stable 7 mm
lingular nodule.

No acute abnormalities.

## 2020-08-14 NOTE — Progress Notes (Signed)
TOD called at 1139 with Sherry Ruffing, RN. 25 mls of Dilaudid wasted in Stericycle in medroom with Sherry Ruffing, RN. Family at bedside with patient.

## 2020-08-14 NOTE — Progress Notes (Signed)
Pt resting comfortably with eyes closed. IV is patent. HR 114 SPO2 89 RR 20. Family declines Blood Pressure monitoring at this time. Pt has coarse crackles throughout all lung fields. Wife is at bedside.

## 2020-08-14 NOTE — Progress Notes (Signed)
Present with Mr Ryan [name], daughter, brother and son for spiritual support after his death.

## 2020-08-14 NOTE — Death Summary Note (Signed)
DEATH SUMMARY   Patient Details  Name: Ryan [name] MRN: [id number] DOB: [birth year]  Admission/Discharge Information   Admit Date:  02-Sep-2020  Date of Death: Date of Death: 09-07-2020  Time of Death: Time of Death: October 14, 1137  Length of Stay: 4  Referring Physician: Emelia Loron, NP   Reason(s) for Hospitalization  sob  Diagnoses  Preliminary cause of death: COPD exacerbation Secondary Diagnoses (including complications and co-morbidities):  Acute on chronic respiratory failure with hypoxia and hypercapnia -Wife reports that he is chronically on 8L oxygen at home via concentrator -Patient was initially placed on nonrebreather mask and subsequently BiPAP in the emergency room -Unfortunately, his condition began to decline -he began vomiting while having the bipap mask on -overall wheezing and shortness of breath worsened -he was intubated for respiratory distress and to protect airway 1/22 -Likely related to COPD exacerbation -Appreciate pulmonology assistance with vent management -Recommendations were for palliative care/hospice since he is end-stage COPD -seen by palliative care and after [place] discussion with family, decision was made to extubate patient once remainder of family arrives -however, patient self - extubated in afternoon 08/07/20 -he was kept on full comfort measures  COPD exacerbation -Currently on intravenous steroids -Continue bronchodilators/inhaled steroids -Continue pulmonary hygiene   Vomiting -patient noted to have significant abdominal distention -abd xray yesterday did not show any signs of obstruction -CT abdomen does not show any evidence of obstruction  Aspiration pneumonitis during vomiting episodes -He was initially started on unasyn, now discontinued after [place] conversation -respiratory is suctioning thick secretions from ET tube  Paroxysmal atrial fibrillation -CHA2DS2-VASc score of at least 2 -On diltiazem for rate control -He has  refused anticoagulation  Chronic diastolic congestive heart failure -Currently appears compensated  GERD -Continue on PPI  Anxiety -Continue prn benzodiazepines  History of pulmonary MAI -Chronically on rifampin, azithromycin and clofazimine -wife reports he has been on these meds for about 2 years  BPH -Continue on Flomax twice daily  Diabetes -will avoid checking blood sugars since patient is on comfort measures  Goals of care -Patient has advanced lung disease and overall prognosis is poor -Palliative care met with patient's wife to discuss goals of care -Patient is now DNR and plans are for one-way extubation once remainder family has arrived from out of town -We will continue supportive measures for now -however, patient self - extubated in afternoon 08/07/20 -he was kept on full comfort measures and expired on 09-07-2020 at [facility] (including significant findings, care, treatment, and services provided and events leading to death)  Ryan [name] is a 60 y.o. male with medical history significant of paroxysmal atrial fibrillation not on anticoagulation, barrettes esophagus, bronchitis, CAP, cavitary lesion of lung growing MAI, chronic back pain, chronic diastolic heart failure, chronic left shoulder and neck pain, collagenous vascular disease, CAD, GERD, hyperlipidemia, polycythemia, remote seizures, type 2 diabetes mellitus, former smoker, pulmonary nodule, pulmonary fibrosis, COPD on home oxygen who is coming to the emergency department due to progressively worse dyspnea for the past 2 days.  According to EMS the patient had already used bowel 8 oh his home oxygen tanks during the day before they arrived.  They treated him with NRB oxygen, 2 DuoNebs, magnesium sulfate 2 g and Solu-Medrol 125 mg IVP.  The patient was placed on BiPAP ventilation   ABG showed pH of 7.341, PCO2 54.5 and PO2 150 mmHg. Coronavirus PCR was negative. CBC showed a white count  9.1, hemoglobin 9.2 g/dL (baseline 9-10) and platelets  255. Troponin was negative twice. BNP was 74.0 pg/mL. Lipase was normal. CMP showed a sodium 133 and chloride 93 mmol/L. Glucose 263, calcium 8.0 and total bilirubin 0.2 mg/dL. Albumin was 2.9 g/dL. The rest of the CMP values were normal.  Pertinent Labs and Studies  Significant Diagnostic Studies DG Abd 1 View  Result Date: 08/04/2020 CLINICAL DATA:  Vomiting EXAM: ABDOMEN - 1 VIEW COMPARISON:  05/29/2019 CT FINDINGS: Moderate gaseous distention of the stomach. This could represent air swallowing, gastroparesis, or outlet obstruction. Scattered gas and stool in the colon. No small or large bowel distention. No radiopaque stones. Visualized bones appear intact. Degenerative changes in the lumbar spine. IMPRESSION: Moderate gaseous distention of the stomach. No evidence of small bowel obstruction. Electronically Signed   By: Lucienne Capers M.D.   On: 08/04/2020 19:43   CT ABDOMEN PELVIS W CONTRAST  Result Date: 08/05/2020 CLINICAL DATA:  60 year old male with abdominal distention, nausea vomiting. Concern for bowel obstruction. EXAM: CT ABDOMEN AND PELVIS WITH CONTRAST TECHNIQUE: Multidetector CT imaging of the abdomen and pelvis was performed using the standard protocol following bolus administration of intravenous contrast. CONTRAST:  140m OMNIPAQUE IOHEXOL 300 MG/ML  SOLN COMPARISON:  CT abdomen pelvis dated 05/29/2019. FINDINGS: Lower chest: Emphysema with areas of atelectasis/scarring. Faint diffuse parenchymal densities may represent atelectasis or sequela of prior atypical infection. No intra-abdominal free air or free fluid. Hepatobiliary: Probable mild fatty liver. No intrahepatic biliary dilatation. The gallbladder is unremarkable. Pancreas: Unremarkable. No pancreatic ductal dilatation or surrounding inflammatory changes. Spleen: Normal in size without focal abnormality. Adrenals/Urinary Tract: The adrenal glands unremarkable. Mild  bilateral renal parenchyma atrophy. There is no hydronephrosis on either side. There is symmetric enhancement and excretion of contrast by both kidneys. The visualized ureters appear unremarkable. The urinary bladder is decompressed around a Foley catheter. Stomach/Bowel: An enteric tube is noted with tip in the body of the stomach. There are small scattered sigmoid diverticula without active inflammatory changes. There is no bowel obstruction or active inflammation. The appendix is normal. Vascular/Lymphatic: Advanced aortoiliac atherosclerotic disease. The IVC is unremarkable. No portal venous gas. There is no adenopathy. Reproductive: The prostate and seminal vesicles are grossly unremarkable. Other: Small umbilical hernia containing a short segment of small bowel without evidence of obstruction or inflammation. Musculoskeletal: Mild degenerative changes of the spine. No acute osseous pathology. IMPRESSION: 1. No acute intra-abdominal or pelvic pathology. No bowel obstruction. Normal appendix. 2. Small scattered sigmoid diverticula. 3. Aortic Atherosclerosis (ICD10-I70.0) and Emphysema (ICD10-J43.9). Electronically Signed   By: AAnner CreteM.D.   On: 08/05/2020 15:35   DG CHEST PORT 1 VIEW  Result Date: 08/06/2020 CLINICAL DATA:  Progressive dyspnea EXAM: PORTABLE CHEST 1 VIEW COMPARISON:  08/04/2020, CT 02/17/2020 FINDINGS: Endotracheal tube seen 2.9 cm above the carina. Nasogastric tube extends into the upper abdomen beyond the margin of the examination. Changes of severe centrilobular emphysema are again identified. Parenchymal scarring within the left apex is unchanged. Left suprahilar rounded opacity appears stable from a immediate prior examination, however, appears progressed from a prior examination of 02/17/2020 and may represent residual opacity related to the cavitary lesion noted on a prior CT examination. This is not well assessed on this examination. No pneumothorax or pleural effusion.  Cardiac size within normal limits. IMPRESSION: Support tubes in expected position. Severe emphysema. Left suprahilar mass, possibly the residua of prior cavitary pneumonia, but indeterminate on this examination. This could be further assessed with CT imaging once the patient's acute issues have resolved. Electronically Signed  By: Fidela Salisbury MD   On: 08/06/2020 05:43   DG Chest Port 1 View  Result Date: 08/04/2020 CLINICAL DATA:  Intubated EXAM: PORTABLE CHEST 1 VIEW COMPARISON:  07/15/2020 FINDINGS: 2 frontal views of the chest demonstrate endotracheal tube overlying tracheal air column tip well above carina. Enteric catheter passes below diaphragm tip and side port project over gastric fundus. Cardiac silhouette is stable. Chronic scarring and volume loss within the left chest. Diffuse emphysema. No airspace disease, effusion, or pneumothorax. IMPRESSION: 1. No complication after intubation. 2. Stable emphysema and bilateral scarring. Electronically Signed   By: Randa Ngo M.D.   On: 08/04/2020 20:06   DG Chest Port 1 View  Result Date: 08/06/2020 CLINICAL DATA:  Shortness of breath for 1 day EXAM: PORTABLE CHEST 1 VIEW COMPARISON:  02/17/2020 FINDINGS: Cardiac shadow is stable. Chronic scarring in the left mid and upper lung is seen and improved. No focal infiltrate or sizable effusion is seen. Mild scarring is noted in the bases right greater than left stable from the prior study. No acute bony abnormality is seen. IMPRESSION: Chronic scarring in the left upper lobe but improved from the prior exam. Bibasilar scarring is noted right greater than left. No acute infiltrate is seen. Electronically Signed   By: Inez Catalina M.D.   On: 08/09/2020 17:43    Microbiology Recent Results (from the past 240 hour(s))  SARS Coronavirus 2 by RT PCR (hospital order, performed in [facility] hospital lab) Nasopharyngeal Nasopharyngeal Swab     Status: None   Collection Time: 07/20/2020  5:25 PM   Specimen:  Nasopharyngeal Swab  Result Value Ref Range Status   SARS Coronavirus 2 NEGATIVE NEGATIVE Final    Comment: (NOTE) SARS-CoV-2 target nucleic acids are NOT DETECTED.  The SARS-CoV-2 RNA is generally detectable in upper and lower respiratory specimens during the acute phase of infection. The lowest concentration of SARS-CoV-2 viral copies this assay can detect is 250 copies / mL. A negative result does not preclude SARS-CoV-2 infection and should not be used as the sole basis for treatment or other patient management decisions.  A negative result may occur with improper specimen collection / handling, submission of specimen other than nasopharyngeal swab, presence of viral mutation(s) within the areas targeted by this assay, and inadequate number of viral copies (<250 copies / mL). A negative result must be combined with clinical observations, patient history, and epidemiological information.  Fact Sheet for Patients:   StrictlyIdeas.no  Fact Sheet for Healthcare Providers: BankingDealers.co.za  This test is not yet approved or  cleared by the [place] FDA and has been authorized for detection and/or diagnosis of SARS-CoV-2 by FDA under an Emergency Use Authorization (EUA).  This EUA will remain in effect (meaning this test can be used) for the duration of the COVID-19 declaration under Section 564(b)(1) of the Act, 21 U.S.C. section 360bbb-3(b)(1), unless the authorization is terminated or revoked sooner.  Performed at [facility], [address] [place], [place] [address]     Lab Basic Metabolic Panel: Recent Labs  Lab 07/16/2020 1831 08/05/20 0444 08/06/20 0731  NA 133* 139 136  K 4.4 4.0 3.9  CL 93* 98 95*  CO2 '28 30 31  ' GLUCOSE 263* 105* 120*  BUN '15 19 15  ' CREATININE 1.01 1.15 1.00  CALCIUM 8.0* 8.3* 7.9*   Liver Function Tests: Recent Labs  Lab 07/15/2020 1831 08/06/20 0731  AST 18 30  ALT 19 20  ALKPHOS  48 42  BILITOT  0.2* 0.4  PROT 6.6 6.6  ALBUMIN 2.9* 2.6*   Recent Labs  Lab 07/27/2020 1831  LIPASE 16   No results for input(s): AMMONIA in the last 168 hours. CBC: Recent Labs  Lab 07/31/2020 1831 08/05/20 0444 08/06/20 0731  WBC 9.1 5.8 8.4  NEUTROABS 8.2*  --   --   HGB 9.2* 9.2* 9.0*  HCT 30.1* 30.9* 30.9*  MCV 80.9 80.3 81.7  PLT 255 252 222   Cardiac Enzymes: No results for input(s): CKTOTAL, CKMB, CKMBINDEX, TROPONINI in the last 168 hours. Sepsis Labs: Recent Labs  Lab 07/27/2020 1831 08/05/20 0444 08/06/20 0731  WBC 9.1 5.8 8.4    Procedures/Operations  Intubation 08/04/20   Shanon Brow Ellouise Mcwhirter 08-12-20, 5:48 PM

## 2020-08-14 DEATH — deceased

## 2020-08-15 IMAGING — CT CT ABD-PELV W/ CM
2 of 5 series · 16 of 46 positions shown, 18 images · IV contrast (Omnipaque or Isovue)
Comparison: 05/23/2019

CLINICAL DATA: Right lower quadrant abdominal pain

EXAM:
CT ABDOMEN AND PELVIS WITH CONTRAST
TECHNIQUE: Multidetector CT imaging of the abdomen and pelvis was performed
using the standard protocol following bolus administration of
intravenous contrast.
CONTRAST:  100mL OMNIPAQUE IOHEXOL 300 MG/ML SOLN, additional oral
enteric contrast

[Series 2: axial st · axial · 0.71mm/px · z∈[-480,-90]mm · 13 of 92 slices shown, 15 images]
[im 7/92  soft-tissue]
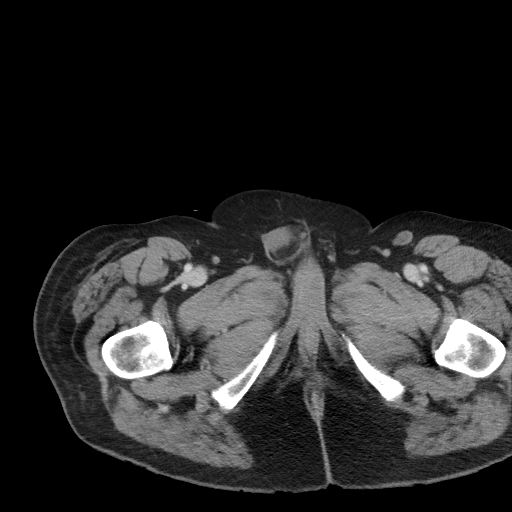
[im 7/92  bone]
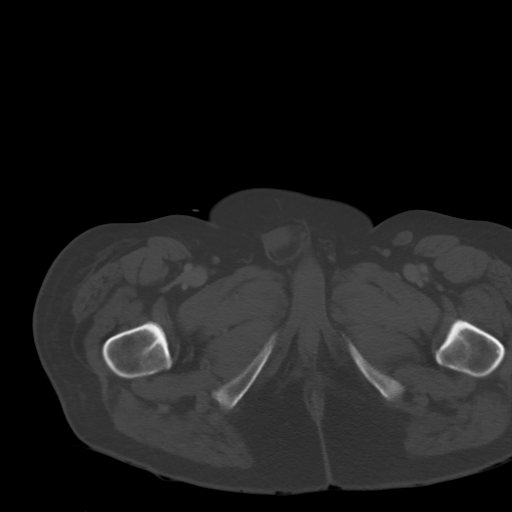
[im 13/92  soft-tissue]
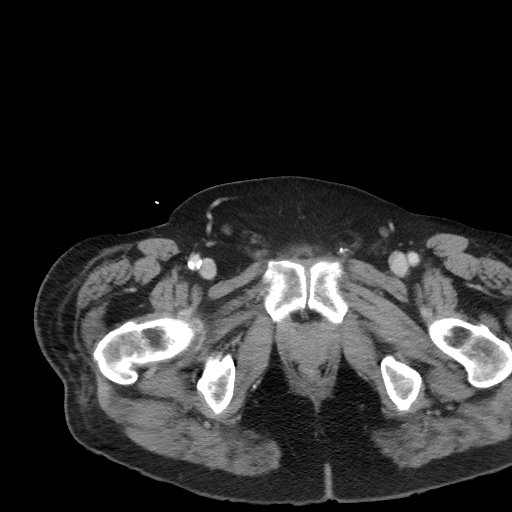
[im 19/92  soft-tissue]
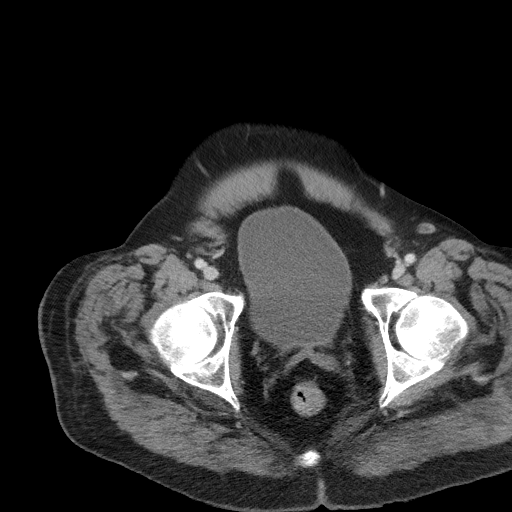
[im 25/92  soft-tissue]
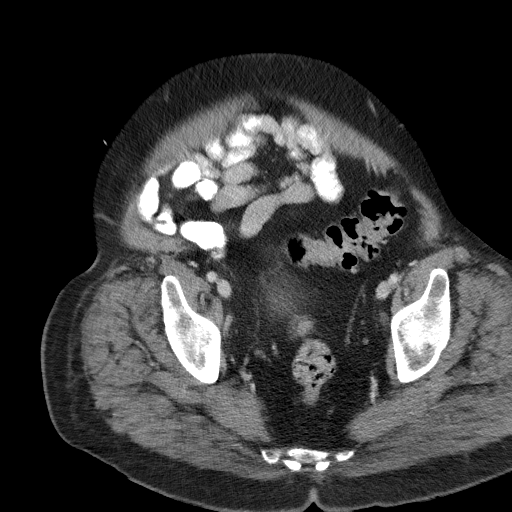
[im 31/92  soft-tissue]
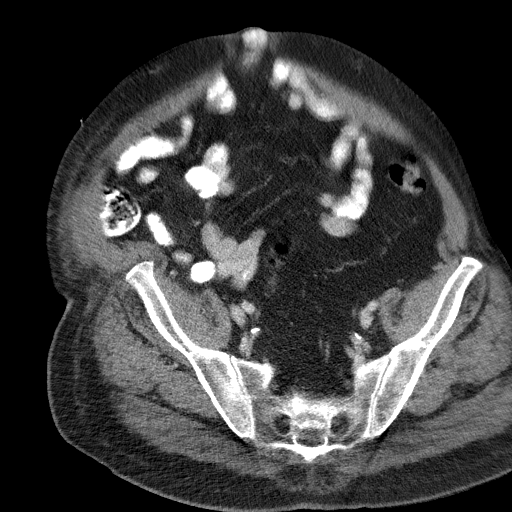
[im 37/92  soft-tissue]
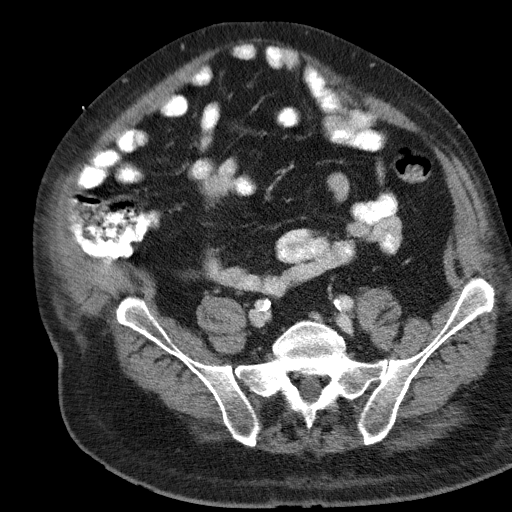
[im 49/92  soft-tissue]
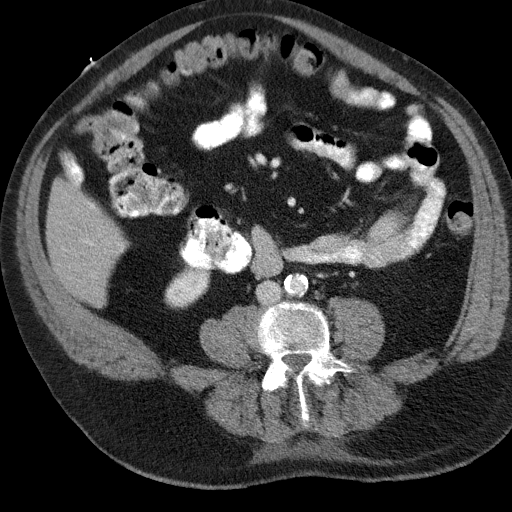
[im 55/92  soft-tissue]
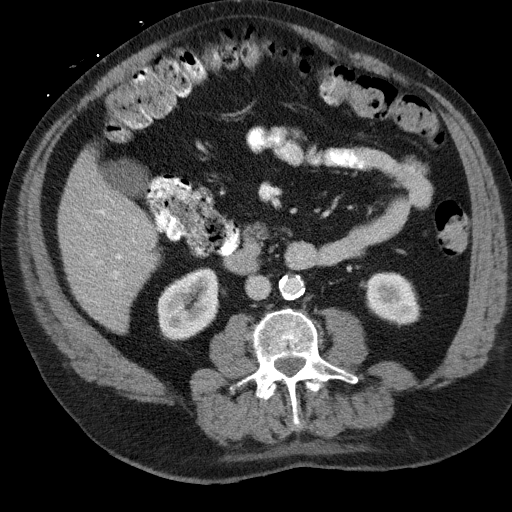
[im 61/92  soft-tissue]
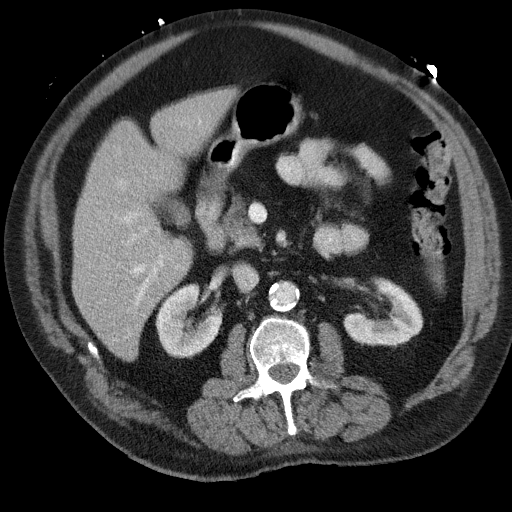
[im 61/92  bone]
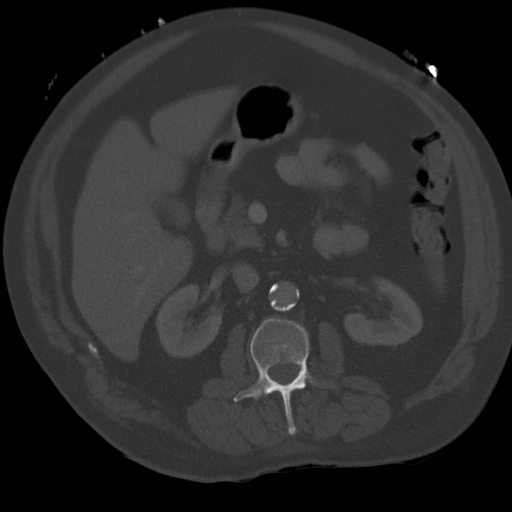
[im 67/92  soft-tissue]
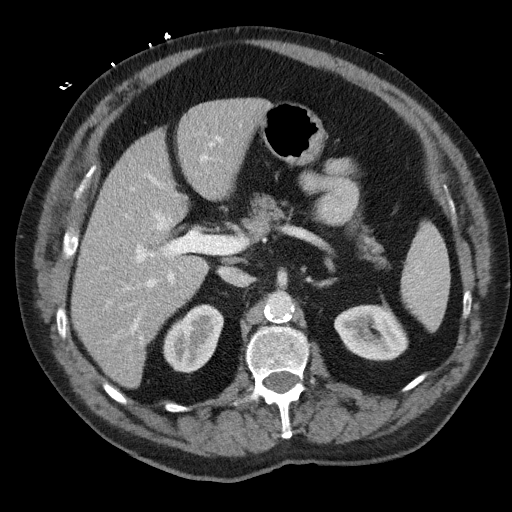
[im 73/92  soft-tissue]
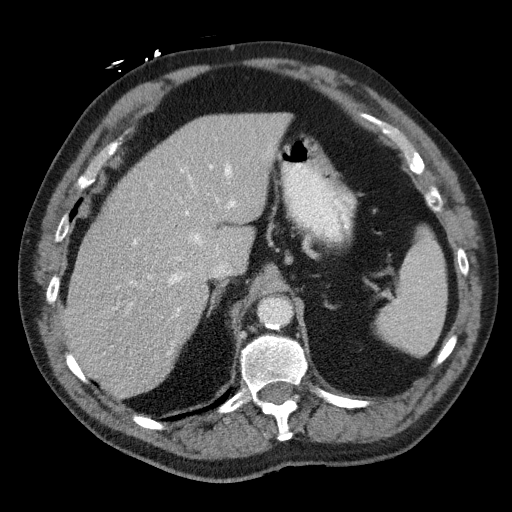
[im 79/92  soft-tissue]
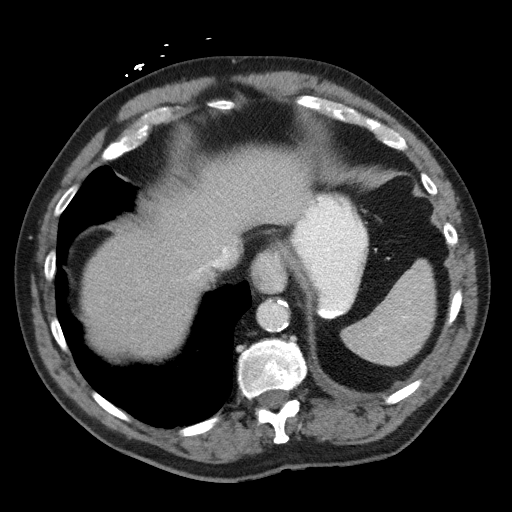
[im 85/92  soft-tissue]
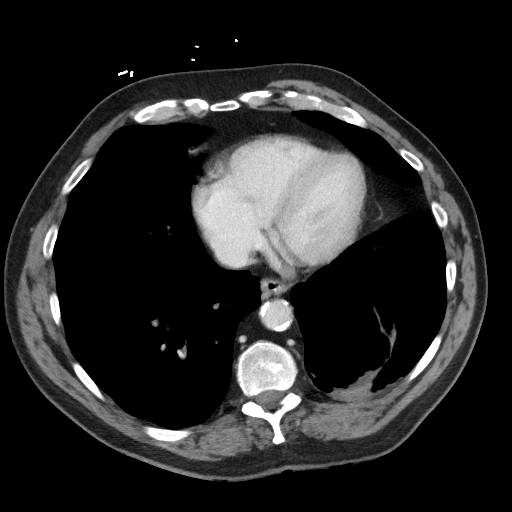

[Series 6: coronal st · coronal · 0.80mm/px · 3 of 115 slices shown]
[im 39/115  soft-tissue]
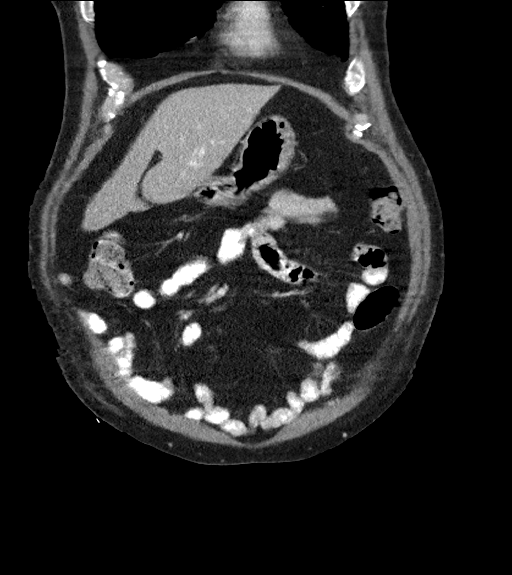
[im 51/115  soft-tissue]
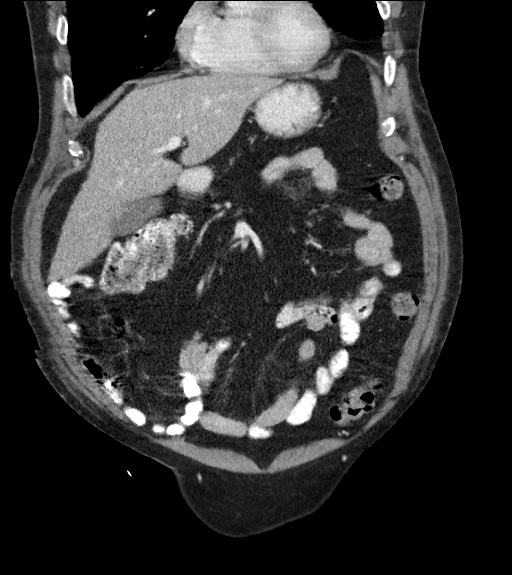
[im 64/115  soft-tissue]
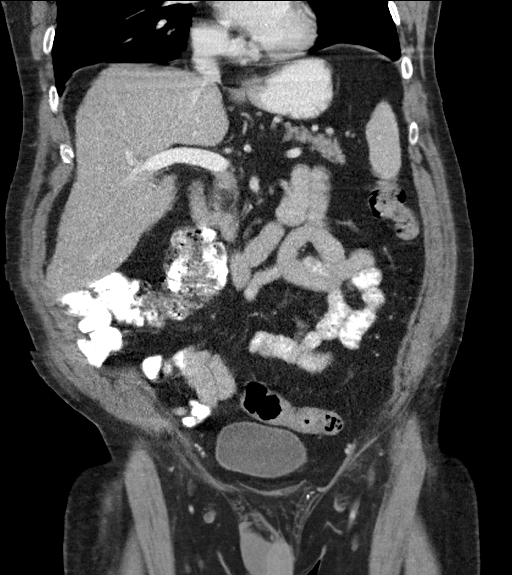

[16 of 46 positions shown; findings below may reference images not displayed]

FINDINGS: Lower chest: Severe emphysema. Scarring or atelectasis of the left
lung base.

Hepatobiliary: No solid liver abnormality is seen. Hepatic
steatosis. No gallstones, gallbladder wall thickening, or biliary
dilatation.

Pancreas: Unremarkable. No pancreatic ductal dilatation or
surrounding inflammatory changes.

Spleen: Normal in size without significant abnormality.

Adrenals/Urinary Tract: Adrenal glands are unremarkable. Kidneys are
normal, without renal calculi, solid lesion, or hydronephrosis.
Bladder is unremarkable.

Stomach/Bowel: Stomach is within normal limits. Appendix appears
normal. No evidence of bowel wall thickening, distention, or
inflammatory changes.

Vascular/Lymphatic: Severe aortic atherosclerosis. No enlarged
abdominal or pelvic lymph nodes.

Reproductive: No mass or other significant abnormality.

Other: Small umbilical hernia which contains a single, nonobstructed
loop of mid small bowel (series 2, image 62). No abdominopelvic
ascites.

Musculoskeletal: No acute or significant osseous findings.
IMPRESSION: 1. No acute CT findings of the abdomen or pelvis to explain right
lower quadrant abdominal pain. Normal appendix.

2.  Hepatic steatosis.

3. Small umbilical hernia which contains a single, nonobstructed
loop of mid small bowel (series 2, image 62).

4.  Aortic Atherosclerosis (AFLRR-G06.6).

5.  Emphysema (AFLRR-UOT.8).

## 2020-10-25 IMAGING — CT CT CHEST HIGH RESOLUTION W/O CM
3 of 5 series · 14 of 36 positions shown, 15 images · non-contrast
Comparison: 09/02/2018, 09/01/2017 and 11/28/2015.

CLINICAL DATA: Shortness of breath, productive cough.

EXAM:
CT CHEST WITHOUT CONTRAST
TECHNIQUE: Multidetector CT imaging of the chest was performed following the
standard protocol without intravenous contrast. High resolution
imaging of the lungs, as well as inspiratory and expiratory imaging,
was performed.

[Series 3: standard chest · axial · 0.72mm/px · z∈[+1206,+1438]mm · 8 of 144 slices shown]
[im 14/144  mediastinal]
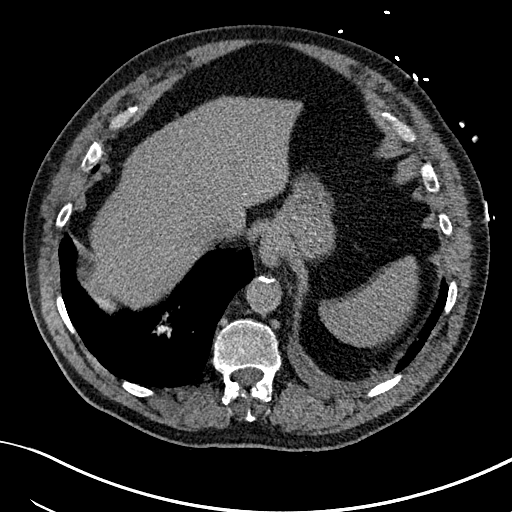
[im 28/144  mediastinal]
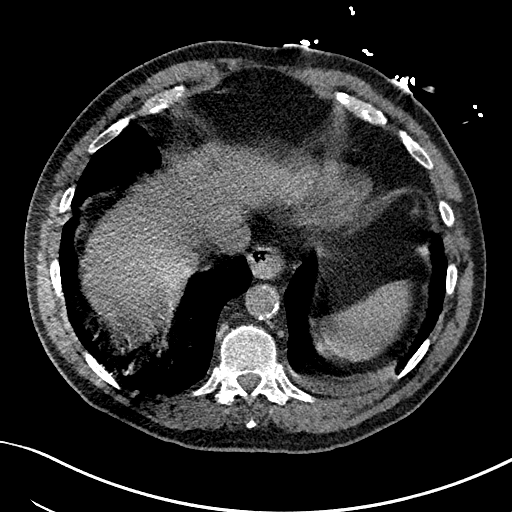
[im 48/144  mediastinal]
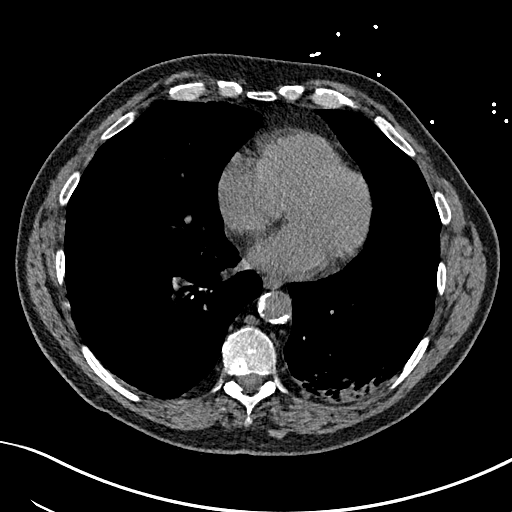
[im 62/144  mediastinal]
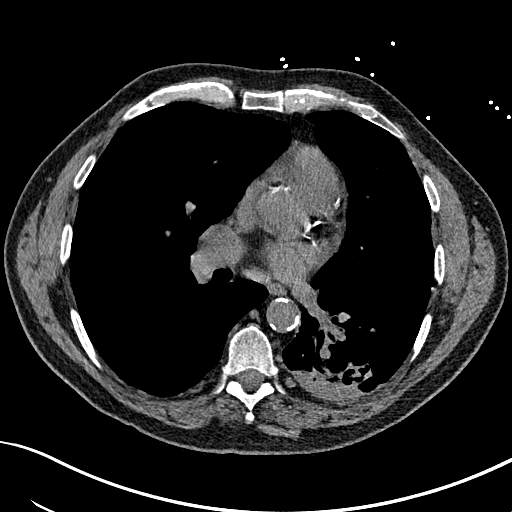
[im 82/144  mediastinal]
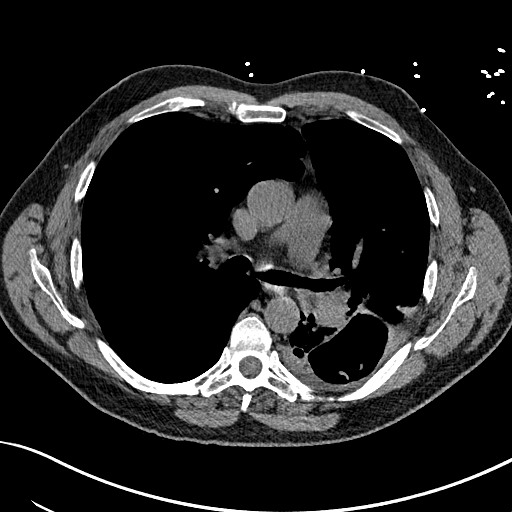
[im 96/144  mediastinal]
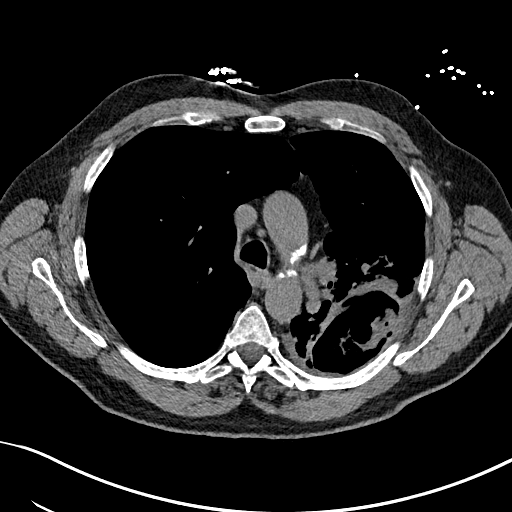
[im 116/144  mediastinal]
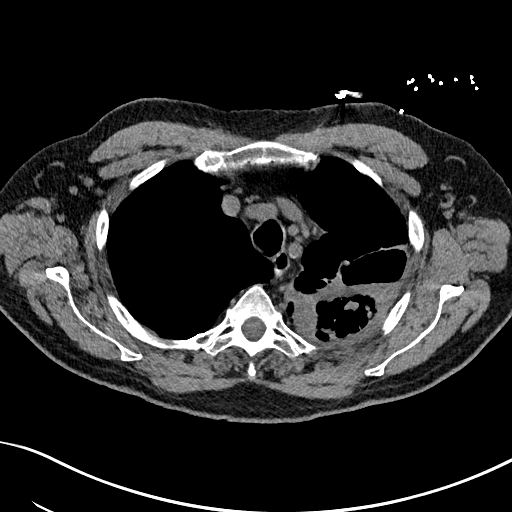
[im 130/144  mediastinal]
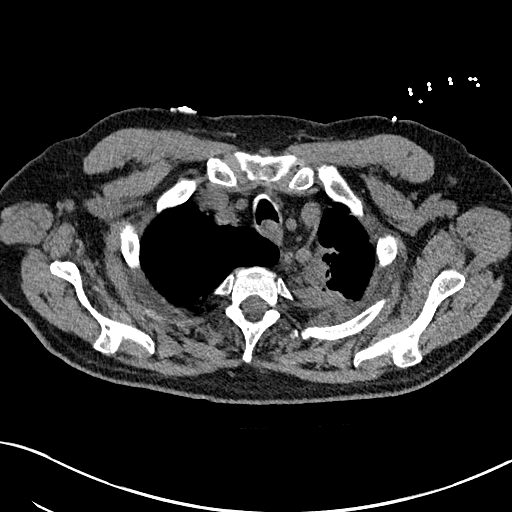

[Series 7: coronal · coronal · 0.65mm/px · 3 of 162 slices shown]
[im 33/162  lung]
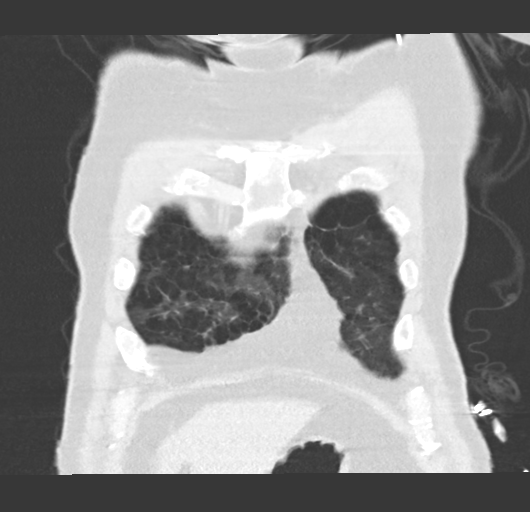
[im 65/162  lung]
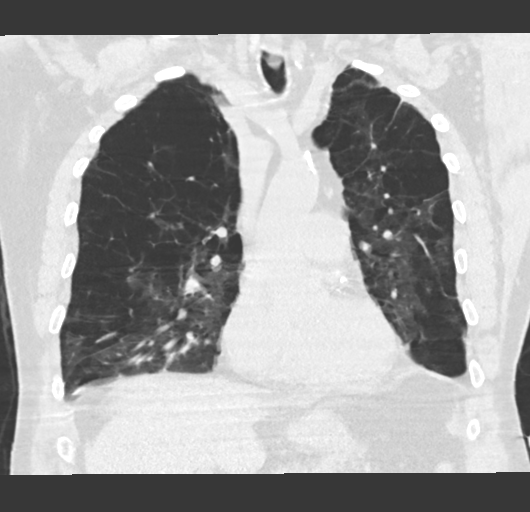
[im 97/162  lung]
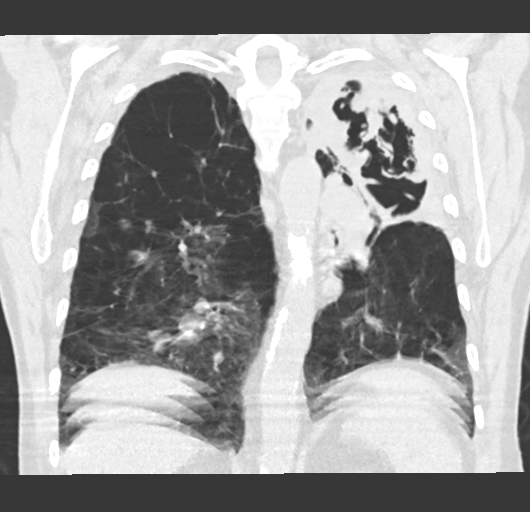

[Series 9: high res insp · axial · 0.77mm/px · z∈[+1195,+1466]mm · 3 of 20 slices shown, 4 images]
[im 1/20  mediastinal]
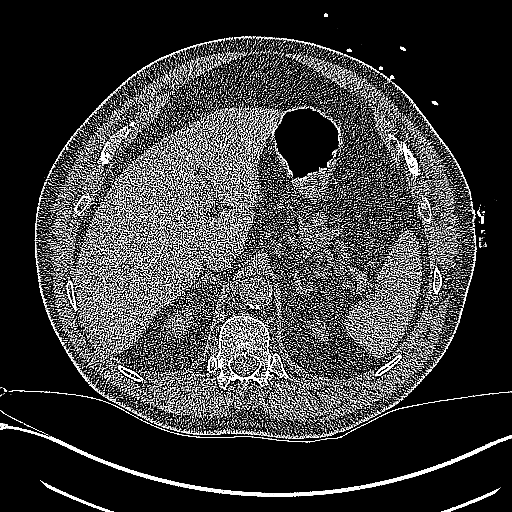
[im 1/20  lung]
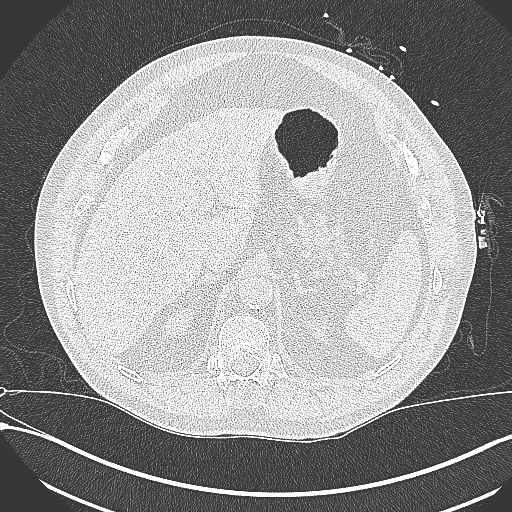
[im 10/20  lung]
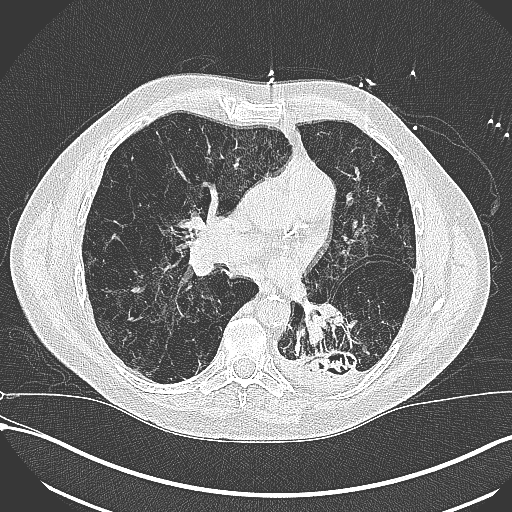
[im 20/20  lung]
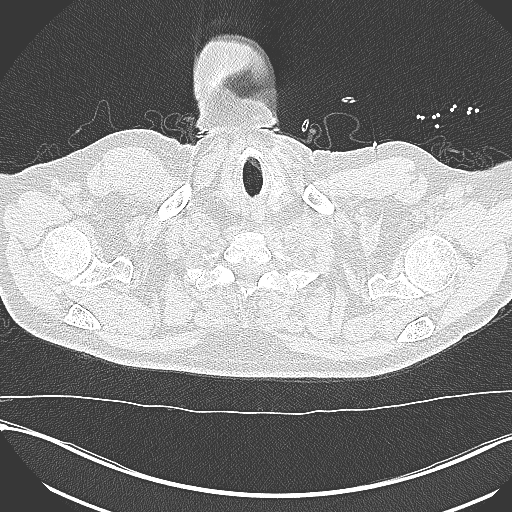

[14 of 36 positions shown; findings below may reference images not displayed]

FINDINGS: Cardiovascular: Atherosclerotic calcification of the aorta and
coronary arteries. Pulmonic trunk is enlarged. Heart size normal. No
pericardial effusion.

Mediastinum/Nodes: No pathologically enlarged mediastinal or
axillary lymph nodes. Hilar regions are difficult to evaluate
without IV contrast. Esophagus is unremarkable.

Lungs/Pleura: Image quality is degraded by respiratory motion.
Severe bullous emphysema. Thick-walled cavity with internal debris
in the posterior left hemithorax measures approximately 5.3 x 8.5 cm
(5/43) and has enlarged significantly from prior exams. Dependent
atelectasis in the left lower lobe. Lingular nodule measures 7 mm
(5/88), unchanged from 09/01/2017 and considered benign. Trace left
pleural fluid. Airway is unremarkable.

Upper Abdomen: Visualized portions of the liver, adrenal glands,
kidneys, spleen, pancreas, stomach and bowel are grossly
unremarkable with the exception of a small hiatal hernia. No upper
abdominal adenopathy.

Musculoskeletal: No worrisome lytic or sclerotic lesions.
IMPRESSION: 1. Thick-walled large cavitary lesion with internal debris in the
posterior left hemithorax, markedly progressive from 09/02/2018. A
chronic infectious/inflammatory etiology, including mycetoma, can
have this appearance. Malignancy cannot be excluded.
2. Trace left pleural effusion.
3. Significant respiratory motion degrades image quality. No
findings worrisome for interstitial lung disease.
4. Aortic atherosclerosis (IUO61-ZSF.F). Coronary artery
calcification.
5. Enlarged pulmonic trunk, indicative of pulmonary arterial
hypertension.
6. Severe bullous Emphysema (IUO61-FDF.V).

## 2020-10-25 IMAGING — DX DG CHEST 1V PORT
1 series · 1 of 1 positions shown · non-contrast
Comparison: Radiograph 05/04/2019, CT 09/02/2018

CLINICAL DATA: Increasing shortness of breath with productive cough

EXAM:
PORTABLE CHEST 1 VIEW

[chest ap]
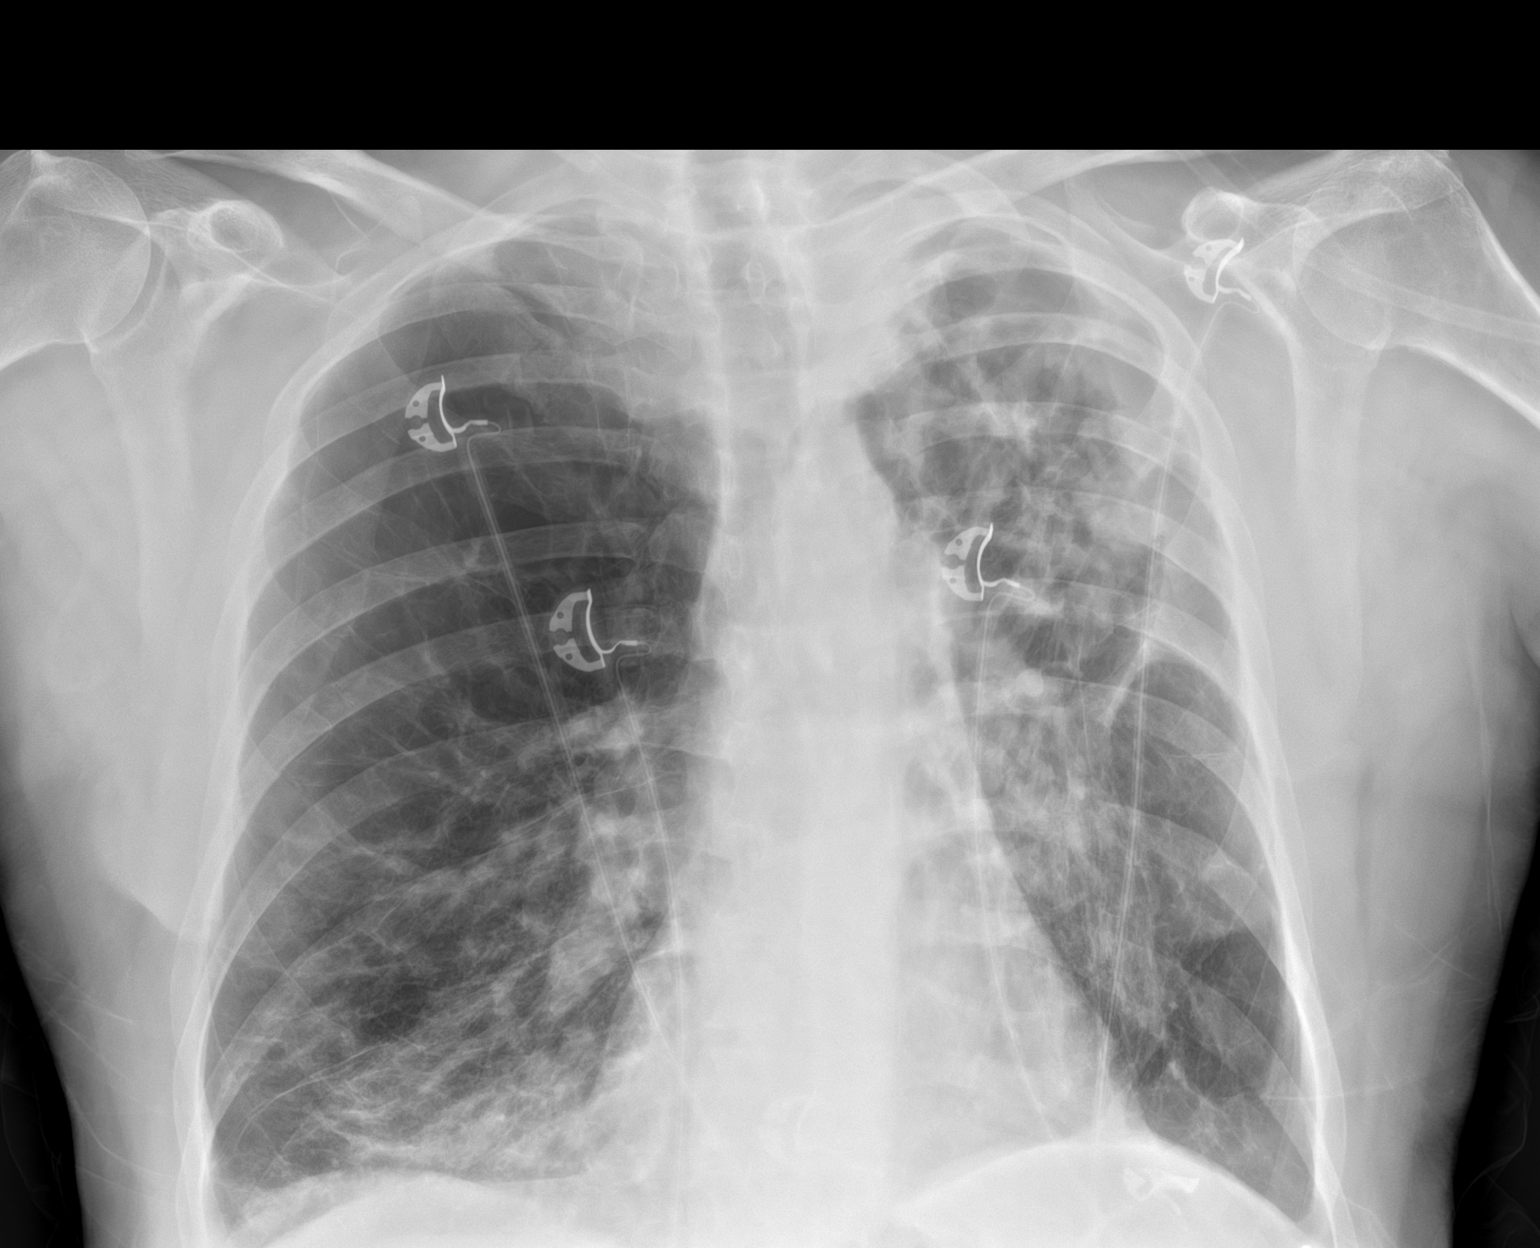

[1 of 1 positions shown; findings below may reference images not displayed]

FINDINGS: Patchy opacities present in the right lung base as well as in the
left infrahilar lung and periphery of the left upper lobe. Findings
are on a background of diffuse biapical scarring and severe
emphysematous changes with architectural distortion most pronounced
in the left upper lung, better visualized on comparison
cross-sectional imaging. Cardiomediastinal contours are similar to
prior counting for differences in technique. Question some left
pleural fluid tracking within the fissures. No clear pneumothorax is
seen. Portion of the right costophrenic sulcus is collimated from
view. No acute osseous or soft tissue abnormality.
IMPRESSION: Patchy opacities in the right lung base as well as in the left
infrahilar lung and periphery of the left upper lobe could reflect
acute infectious consolidation.

Background of severe emphysematous changes with widespread scarring
and architectural distortion most pronounced in the left upper lung.

## 2020-10-25 IMAGING — US US EXTREM LOW VENOUS
1 series · 14 of 24 positions shown · non-contrast
Comparison: None.

CLINICAL DATA: Leg swelling x1 month.

EXAM:
BILATERAL LOWER EXTREMITY VENOUS DOPPLER ULTRASOUND
TECHNIQUE: Gray-scale sonography with compression, as well as color and duplex
ultrasound, were performed to evaluate the deep venous system(s)
from the level of the common femoral vein through the popliteal and
proximal calf veins.

[Series 1: us extrem low venous · 0.08mm/px · 14 of 62 slices shown]
[im 1/62]
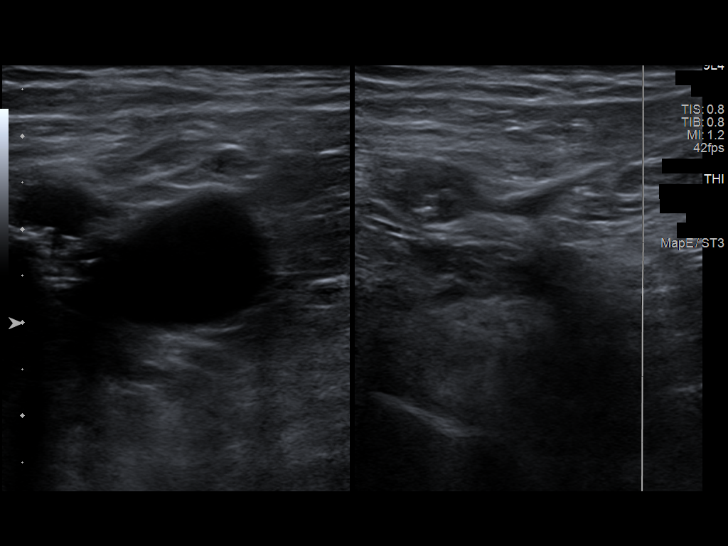
[im 6/62]
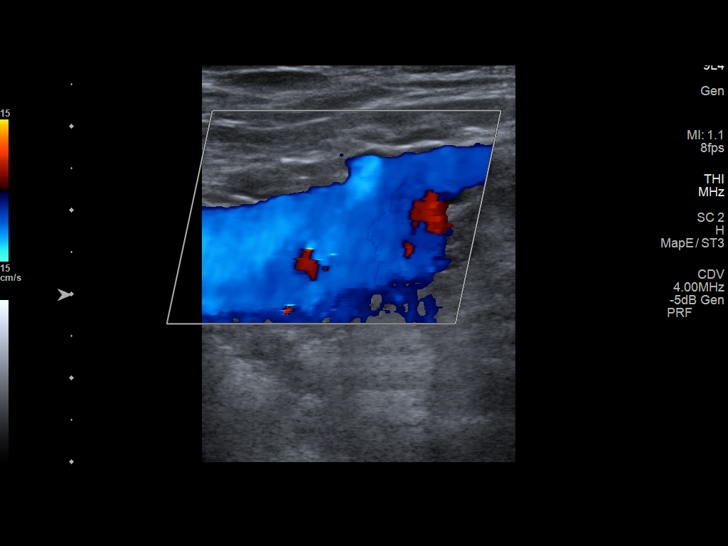
[im 11/62]
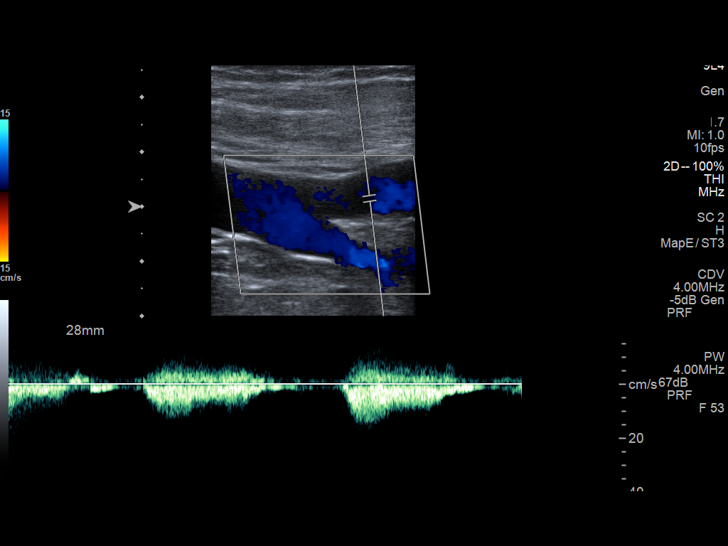
[im 16/62]
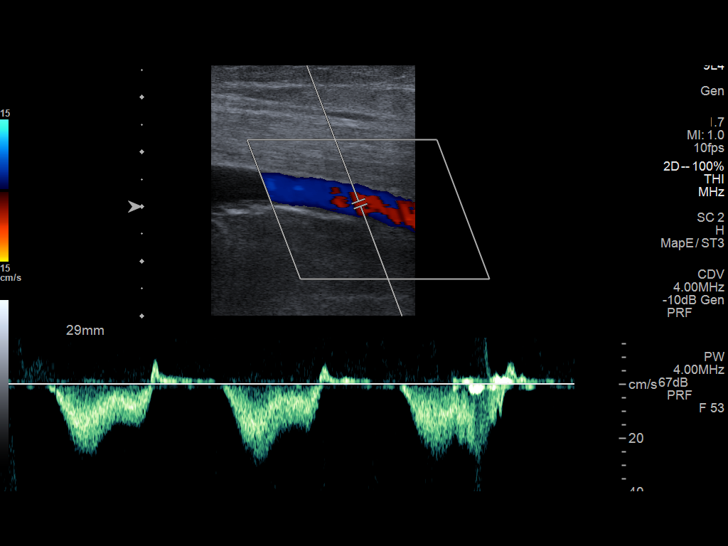
[im 19/62]
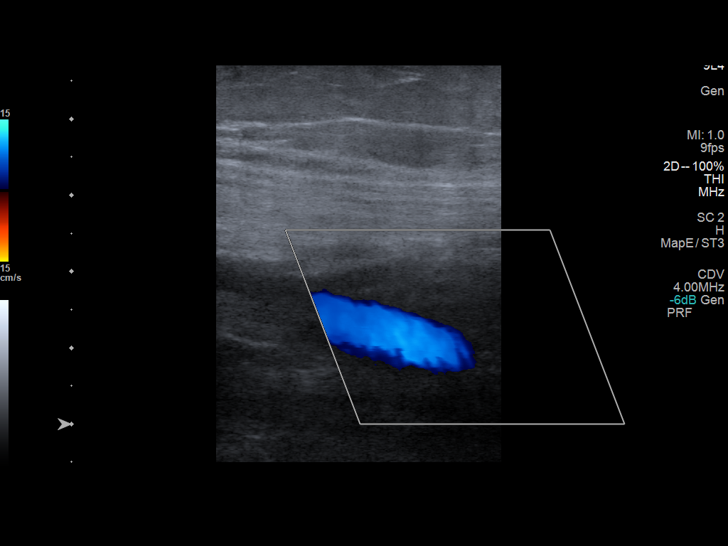
[im 24/62]
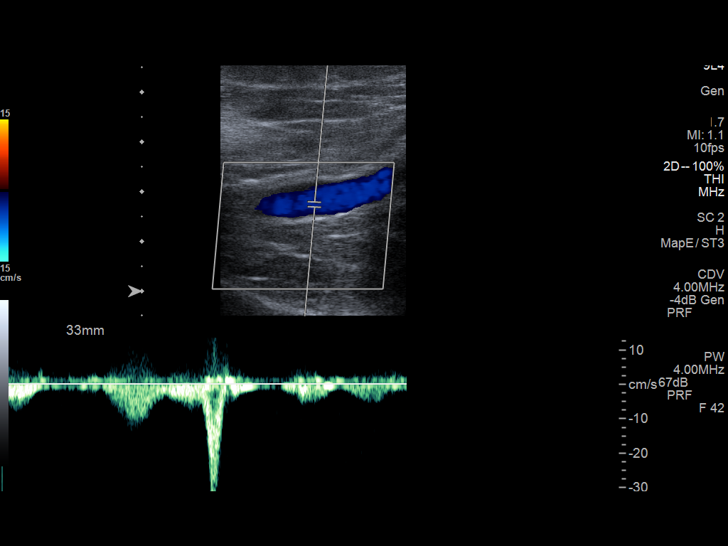
[im 30/62]
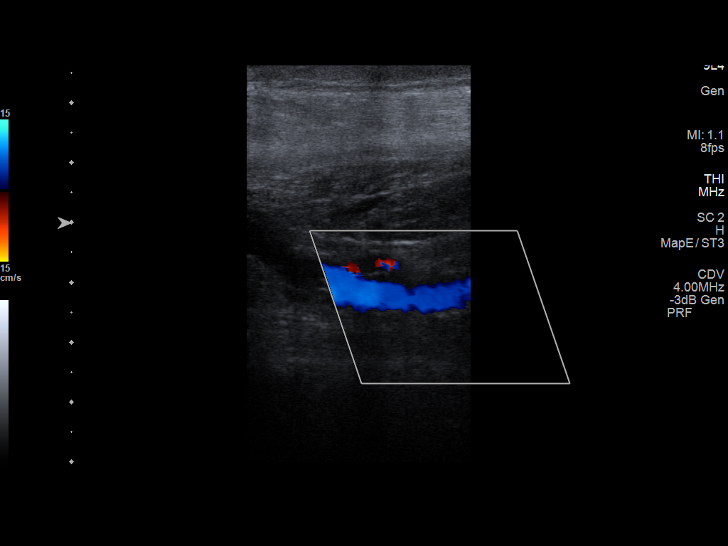
[im 32/62]
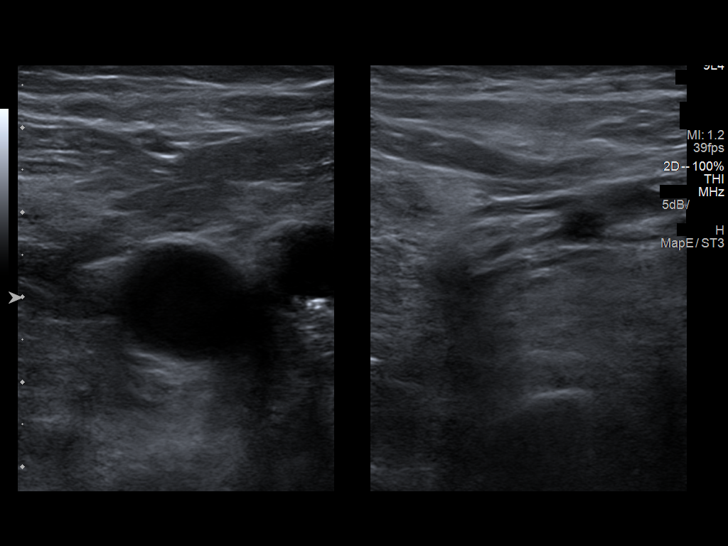
[im 38/62]
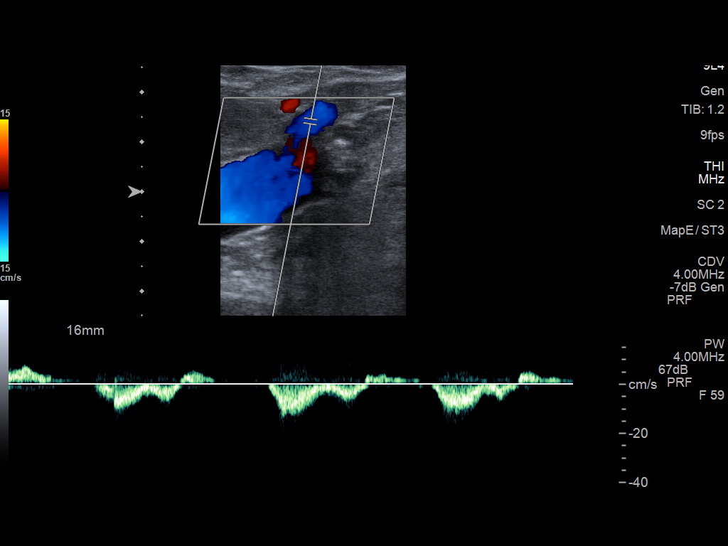
[im 43/62]
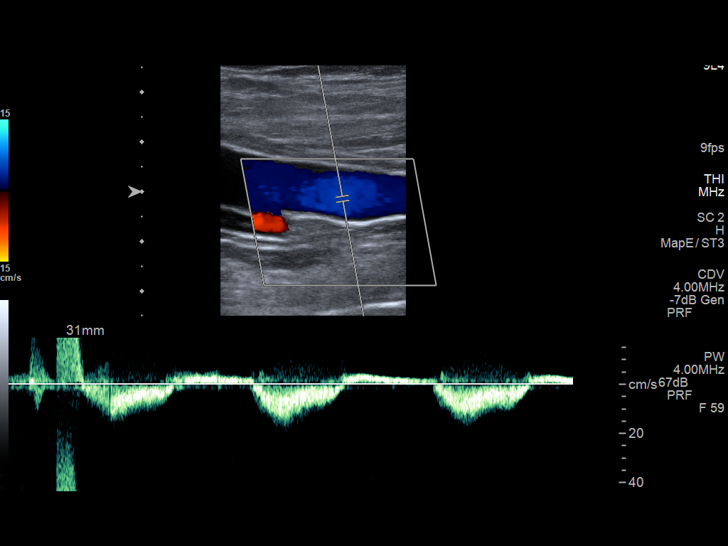
[im 48/62]
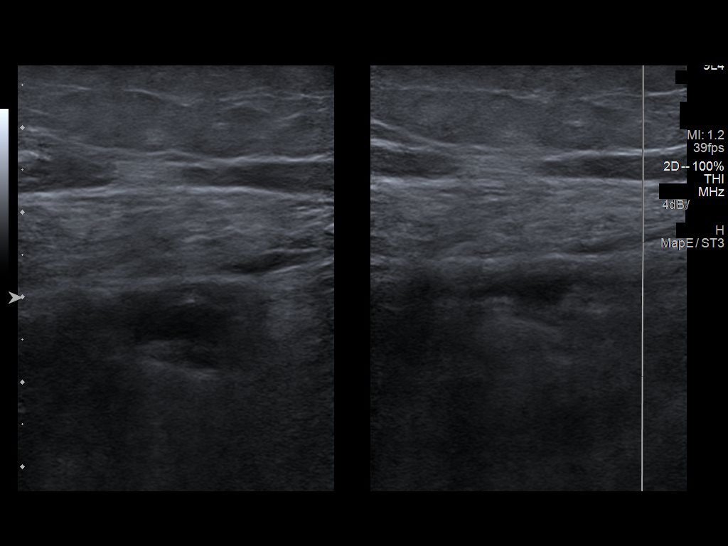
[im 51/62]
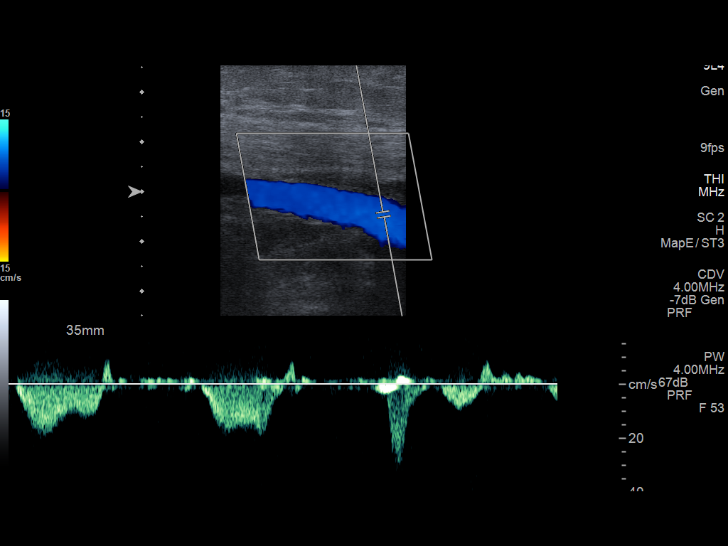
[im 56/62]
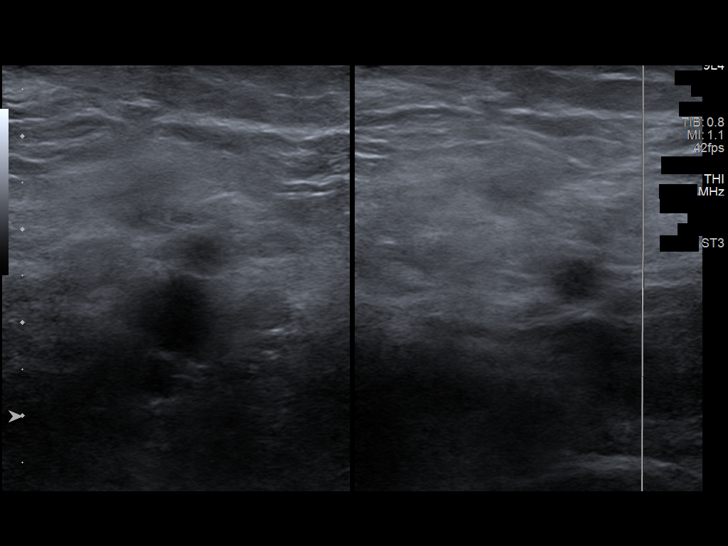
[im 62/62]
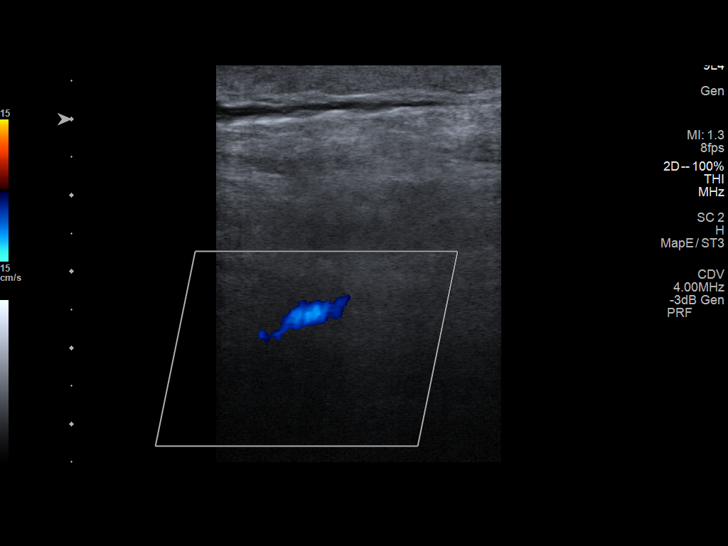

[14 of 24 positions shown; findings below may reference images not displayed]

FINDINGS: VENOUS

Normal compressibility of the common femoral, superficial femoral,
and popliteal veins, as well as the visualized calf veins.
Visualized portions of profunda femoral vein and great saphenous
vein unremarkable. No filling defects to suggest DVT on grayscale or
color Doppler imaging. Doppler waveforms show normal direction of
venous flow, normal respiratory phasicity and response to
augmentation.

Limited views of the contralateral common femoral vein are
unremarkable.

OTHER

None.

Limitations: none
IMPRESSION: No femoropopliteal DVT nor evidence of DVT within the visualized
calf veins.

If clinical symptoms are inconsistent or if there are persistent or
worsening symptoms, further imaging (possibly involving the iliac
veins) may be warranted.

## 2021-05-06 IMAGING — CT CT ANGIO CHEST
2 of 6 series · 18 of 46 positions shown · IV contrast (Omnipaque or Isovue)
Comparison: CT 08/08/2019

CLINICAL DATA: Chest pain, hemoptysis

EXAM:
CT ANGIOGRAPHY CHEST WITH CONTRAST
TECHNIQUE: Multidetector CT imaging of the chest was performed using the
standard protocol during bolus administration of intravenous
contrast. Multiplanar CT image reconstructions and MIPs were
obtained to evaluate the vascular anatomy.
CONTRAST:  100mL OMNIPAQUE IOHEXOL 350 MG/ML SOLN

[Series 5: pe axial thins · axial · 0.83mm/px · z∈[+882,+1184]mm · 15 of 332 slices shown]
[im 15/332  lung]
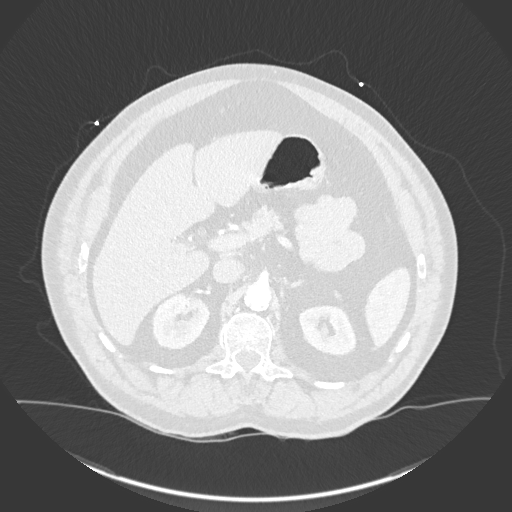
[im 44/332  soft-tissue]
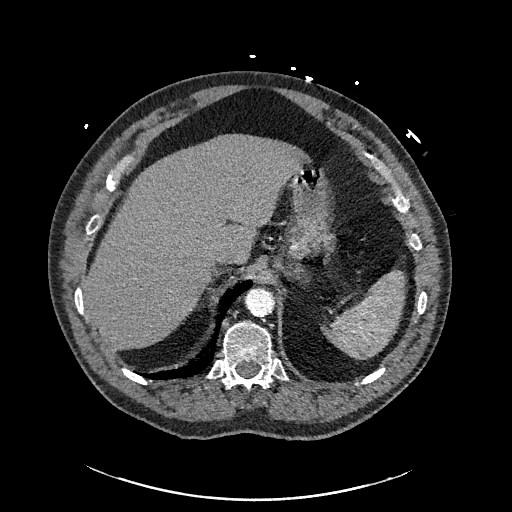
[im 58/332  lung]
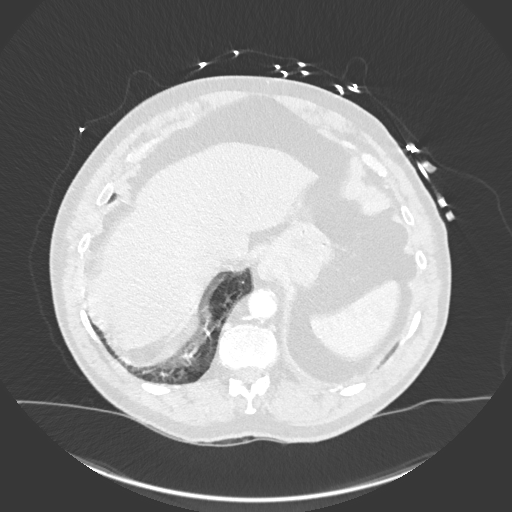
[im 87/332  soft-tissue]
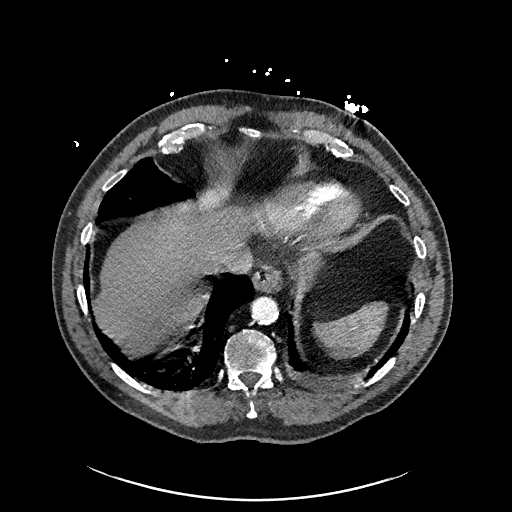
[im 101/332  lung]
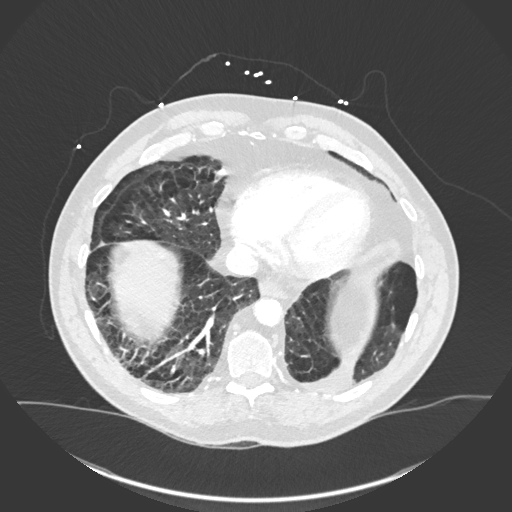
[im 130/332  soft-tissue]
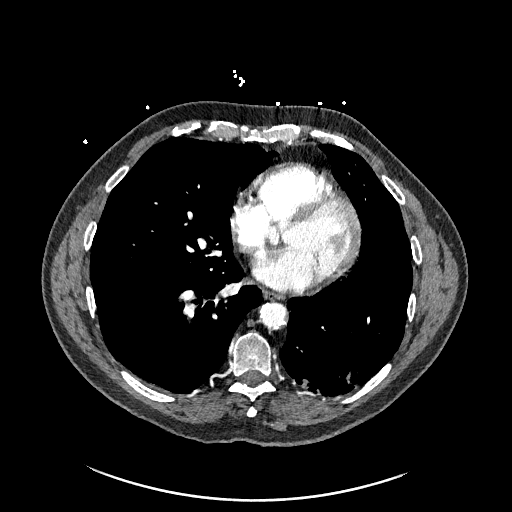
[im 144/332  lung]
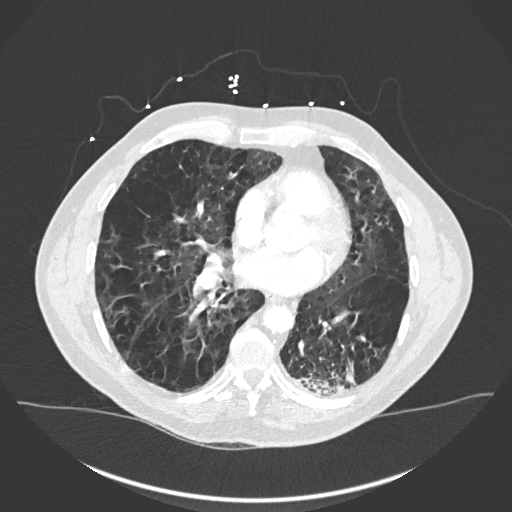
[im 173/332  soft-tissue]
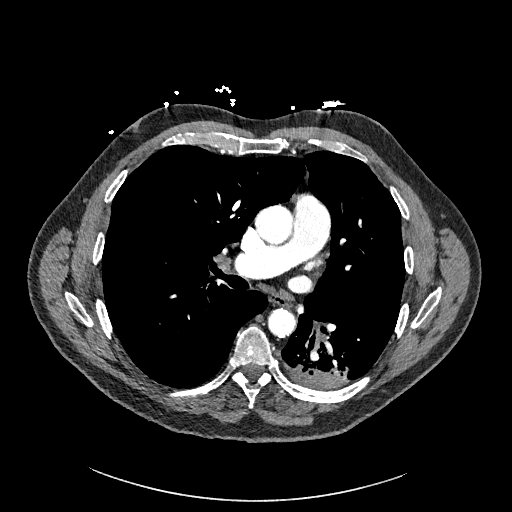
[im 188/332  lung]
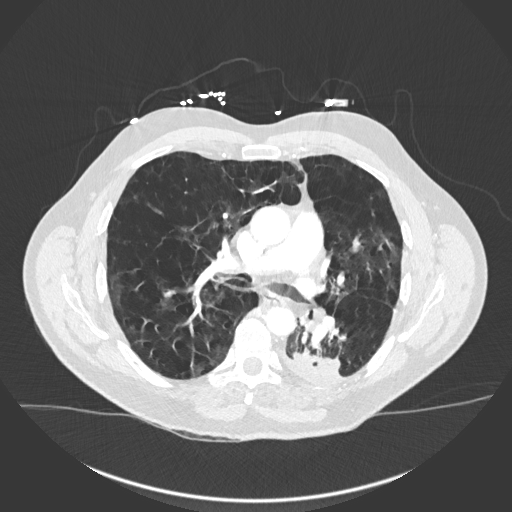
[im 202/332  soft-tissue]
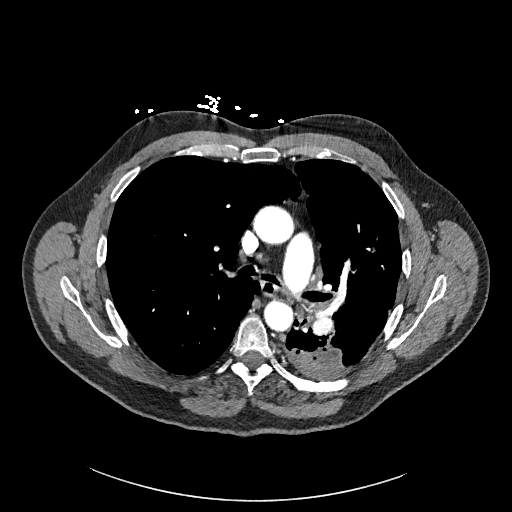
[im 231/332  lung]
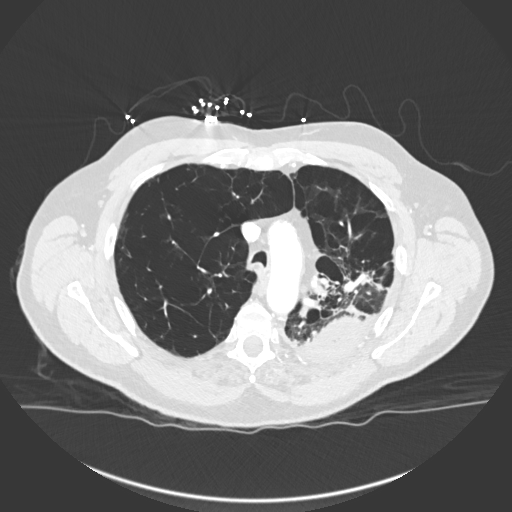
[im 245/332  soft-tissue]
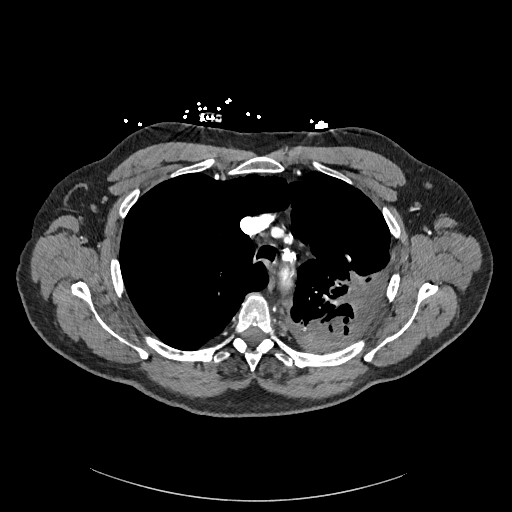
[im 274/332  lung]
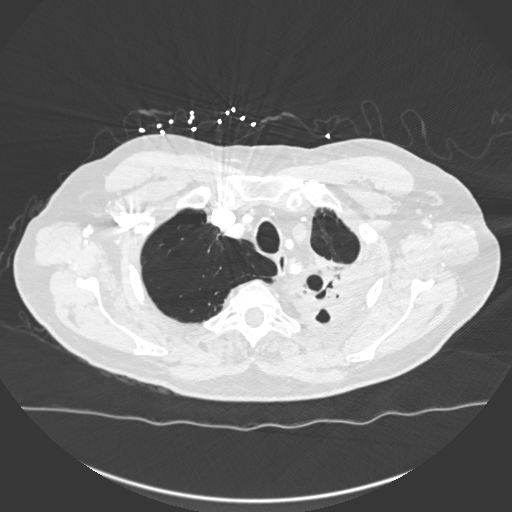
[im 288/332  soft-tissue]
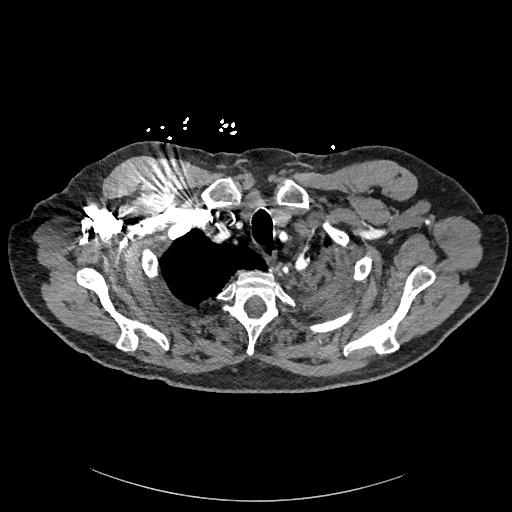
[im 317/332  lung]
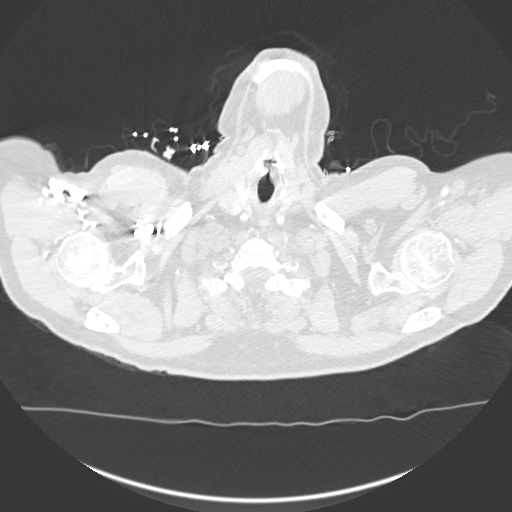

[Series 7: cor soft · coronal · 0.68mm/px · 3 of 168 slices shown]
[im 42/168  soft-tissue]
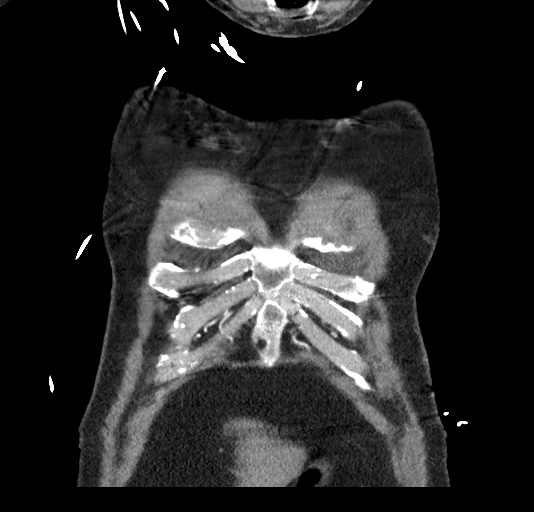
[im 84/168  soft-tissue]
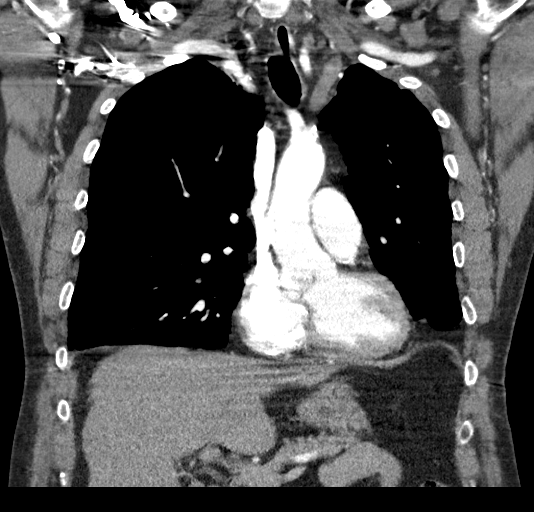
[im 126/168  soft-tissue]
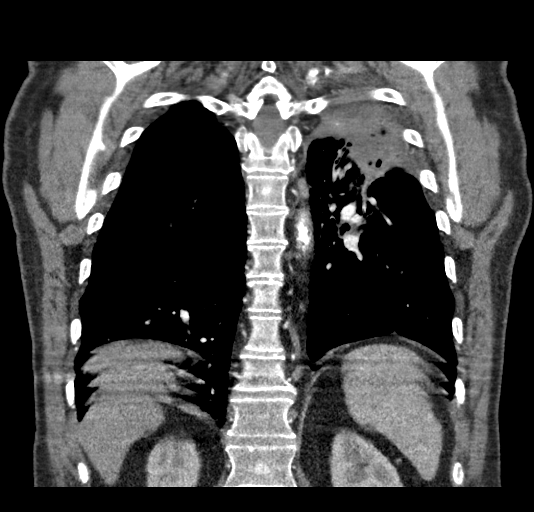

[18 of 46 positions shown; findings below may reference images not displayed]

FINDINGS: Cardiovascular: Satisfactory opacification of the pulmonary arteries
to the segmental level. Respiratory motion mildly degrades
evaluation within the lung bases. No evidence of pulmonary embolism.
Normal heart size. No pericardial effusion. Thoracic aorta is
nonaneurysmal. Atherosclerotic calcifications of the aorta and
coronary arteries.

Mediastinum/Nodes: Mildly enlarged bilateral hilar lymph nodes
measuring 10 mm on the left and 9 mm on the right (series 4, images
45 and 54). No axillary or mediastinal lymphadenopathy. Thyroid
gland within normal limits. Small sliding-type hiatal hernia.
Esophagus otherwise unremarkable. Trachea unremarkable.

Lungs/Pleura: Severe bullous emphysema redemonstration of thick
walled cavity with internal debris in the posterior left hemithorax
now measuring approximately 6.1 x 3.0 cm trans axially (series 6,
image 49), previously measured approximately 8.5 x 5.3 cm. Cranial
caudal extent measuring approximately 17.8 cm (series 8, image 128),
previously approximately 20 cm. Stable 0.8 cm nodule within the
lingula (series 6, image 91), benign. Trace left pleural fluid.
Right lung is clear. No pneumothorax.

Upper Abdomen: No acute findings within the visualized portion of
the upper abdomen.

Musculoskeletal: No chest wall abnormality. No acute or significant
osseous findings.

Review of the MIP images confirms the above findings.
IMPRESSION: 1. No evidence of acute pulmonary embolism.
2. Slight interval decrease in size of a thick-walled cavity with
internal debris in the posterior left hemithorax. Findings favor a
chronic infectious/inflammatory etiology such as a mycetoma.
3. Severe bullous emphysema ([id number].L).
4. Mildly enlarged bilateral hilar lymph nodes, likely reactive.
5. Small hiatal hernia.
6. Aortic and coronary artery atherosclerosis ([id number]-[id number].T).

## 2021-05-06 IMAGING — DX DG CHEST 1V PORT
1 series · 1 of 1 positions shown · non-contrast
Comparison: August 08, 2019 chest radiograph and chest CT

CLINICAL DATA: Chest pain and hemoptysis

EXAM:
PORTABLE CHEST 1 VIEW

[chest ap]
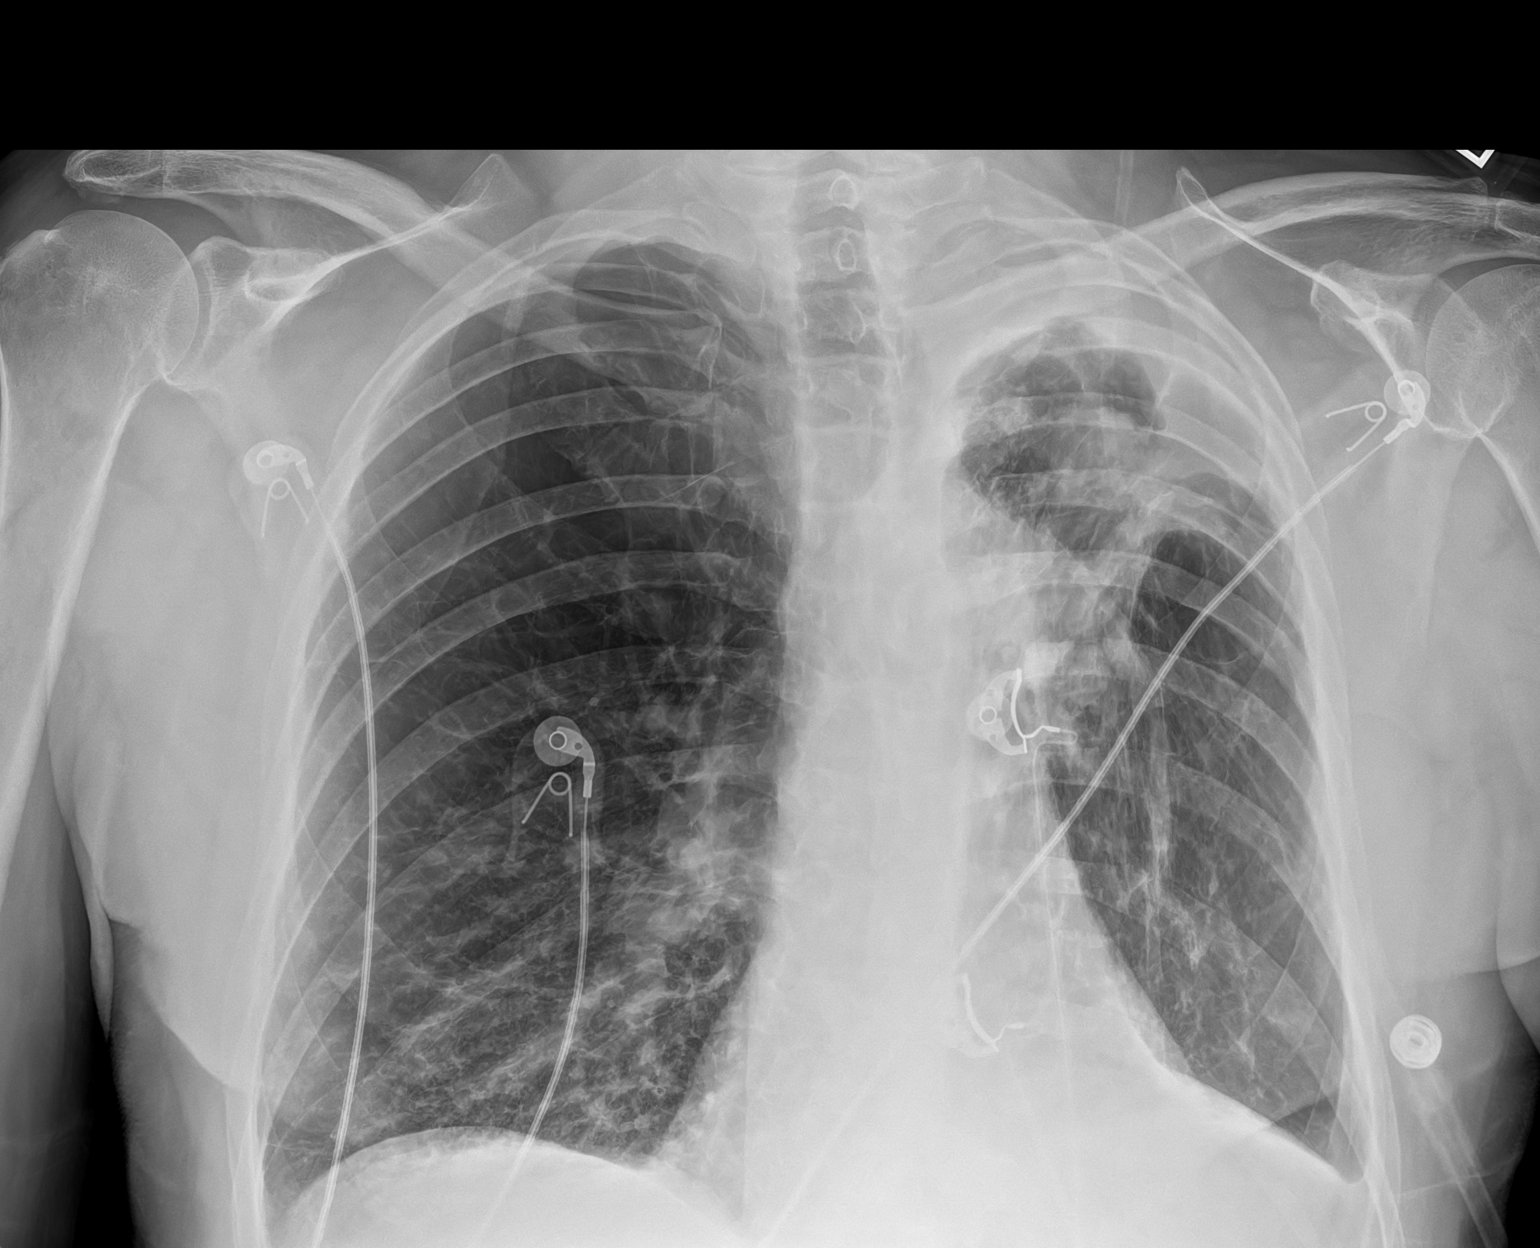

[1 of 1 positions shown; findings below may reference images not displayed]

FINDINGS: There is volume loss on the left with fibrosis and soft tissue
thickening throughout the left upper lobe involving the left apex
and apicolateral regions. There has been progression of volume loss
in this area compared to the previous study.

Elsewhere there is underlying emphysematous change with bullous
disease in the upper lobes. There is interstitial thickening in the
lung base regions which is likely in part due to fibrosis and in
part due to redistribution of blood flow to viable lung segments.
There is atelectatic change in the left base.

Heart size is normal. There is distortion of pulmonary vascularity
on the left. Diminished vascularity in the upper lobes is indicative
of the bullous disease. No adenopathy is appreciable by radiography.
No bone lesions.
IMPRESSION: Extensive underlying emphysematous change with areas of scarring.
Increase in fibrosis in the left upper lobe with progressive volume
loss and pleural thickening. Cavitation in this area is suspected,
better seen on prior CT.

Given progression of volume loss in the left upper lobe and
hemoptysis, correlation with chest CT at this time to directly
compare with prior CT is felt to be advisable.

Heart size normal. No adenopathy evident by radiography. Note that
there is now mild shift of the mediastinum toward the left upper
lobe given the volume loss in this area.

## 2021-10-21 IMAGING — DX DG CHEST 1V PORT
1 series · 1 of 1 positions shown · non-contrast
Comparison: 02/17/2020

CLINICAL DATA: Shortness of breath for 1 day

EXAM:
PORTABLE CHEST 1 VIEW

[chest ap]
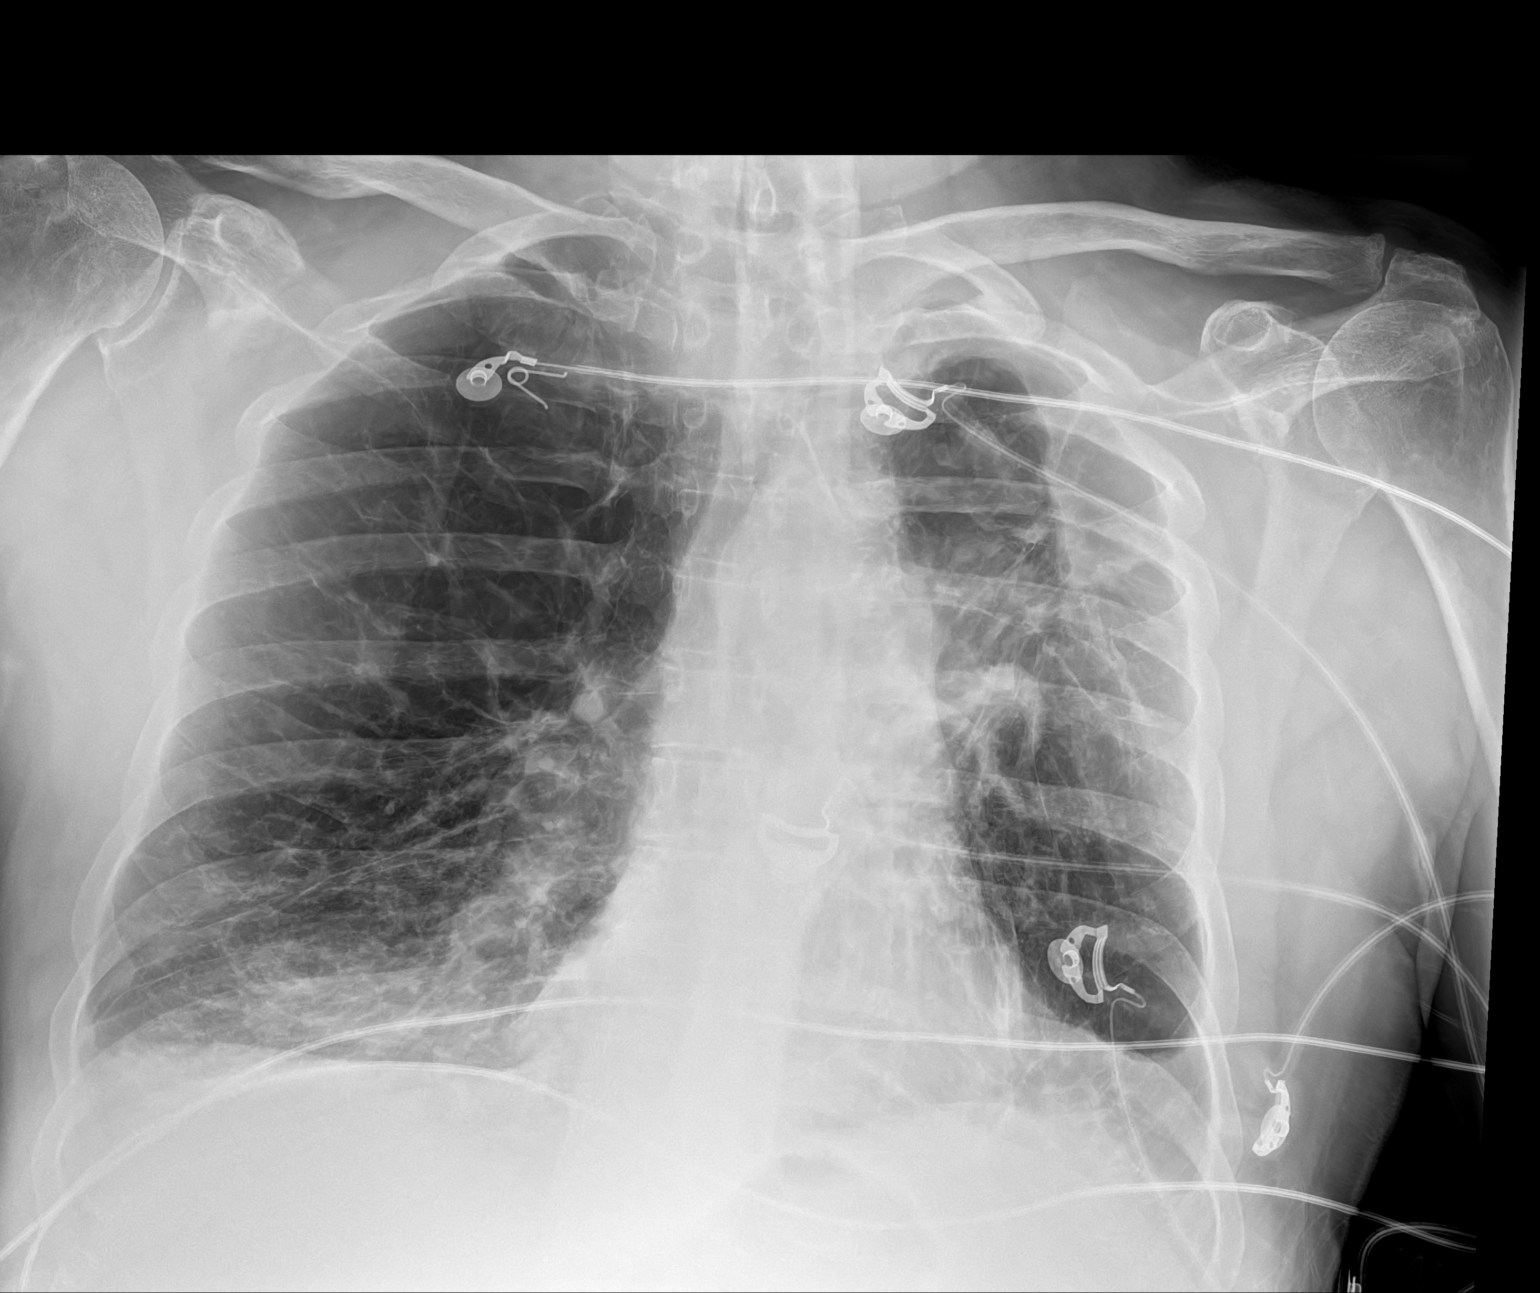

[1 of 1 positions shown; findings below may reference images not displayed]

FINDINGS: Cardiac shadow is stable. Chronic scarring in the left mid and upper
lung is seen and improved. No focal infiltrate or sizable effusion
is seen. Mild scarring is noted in the bases right greater than left
stable from the prior study. No acute bony abnormality is seen.
IMPRESSION: Chronic scarring in the left upper lobe but improved from the prior
exam.

Bibasilar scarring is noted right greater than left. No acute
infiltrate is seen.

## 2021-10-22 IMAGING — DX DG CHEST 1V PORT
2 series · 2 of 2 positions shown · non-contrast
Comparison: 08/03/2020

CLINICAL DATA: Intubated

EXAM:
PORTABLE CHEST 1 VIEW

[chest ap (1 of 2)]
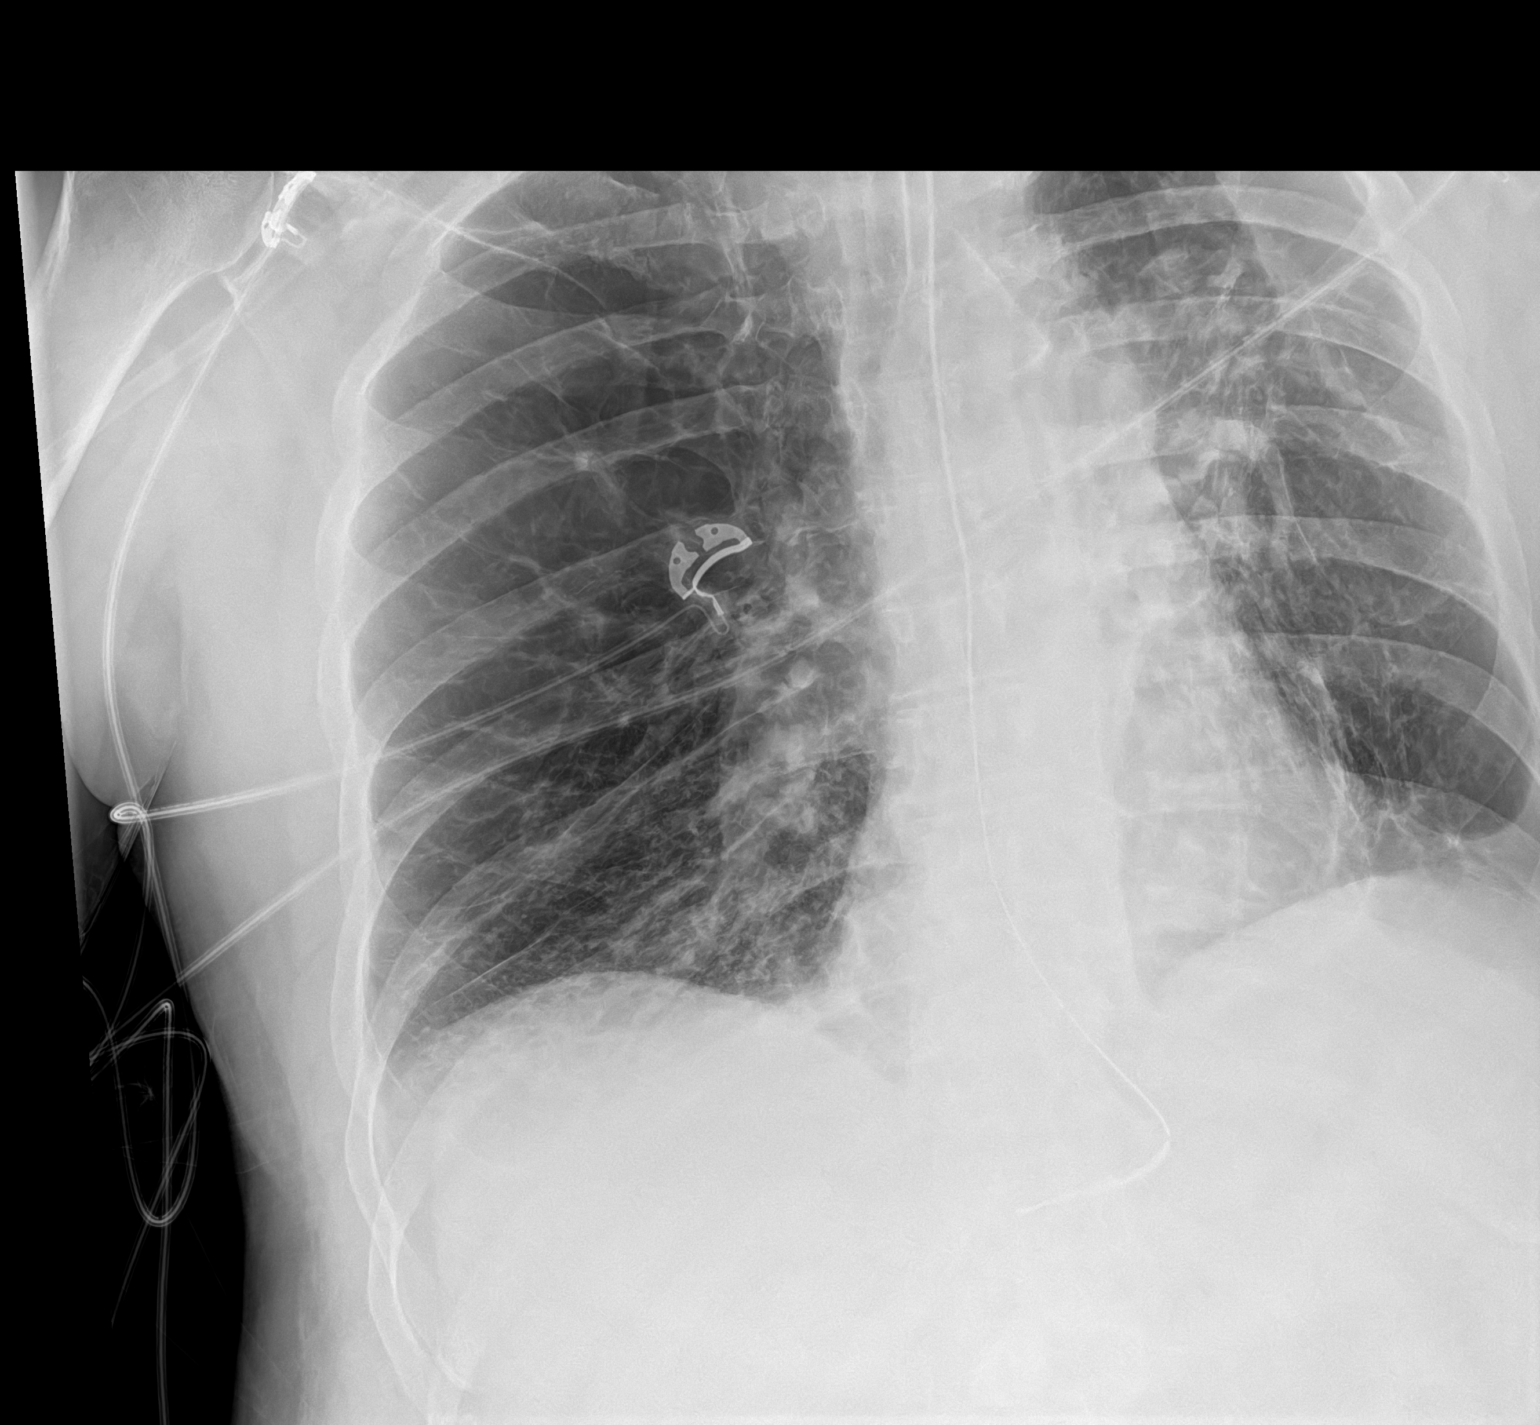

[chest ap (2 of 2)]
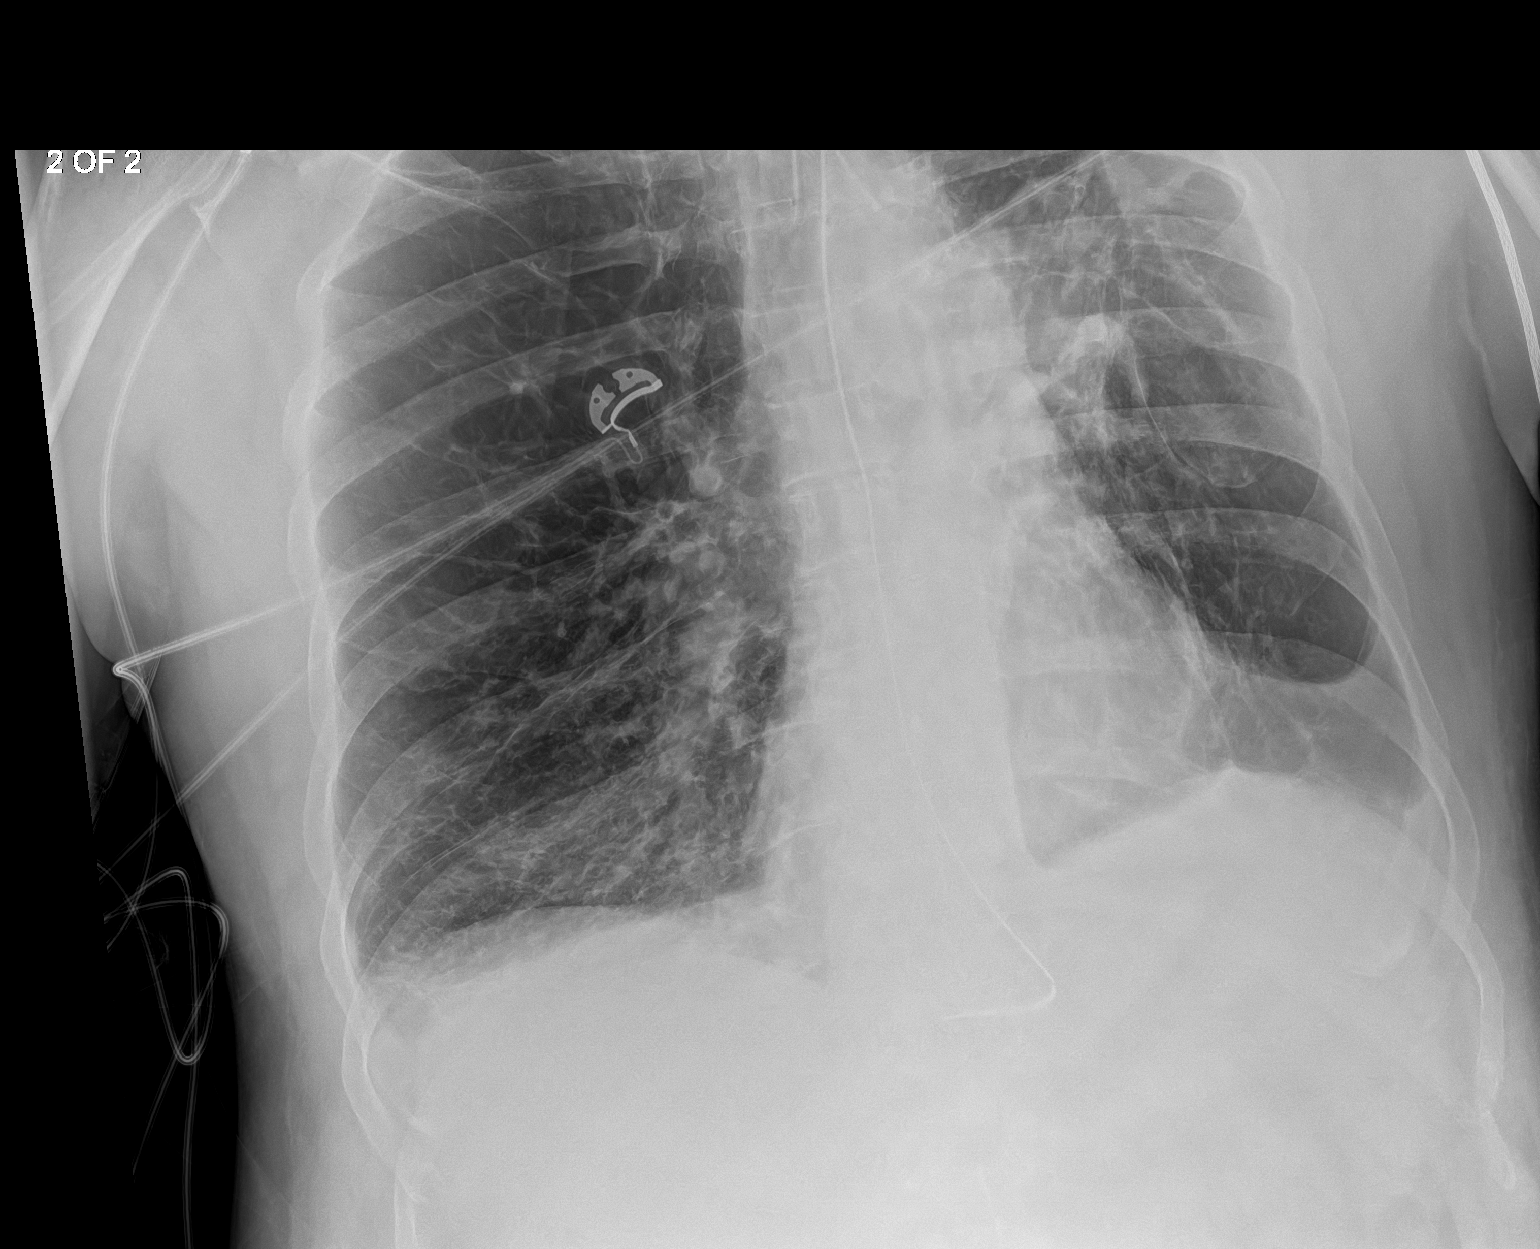

[2 of 2 positions shown; findings below may reference images not displayed]

FINDINGS: 2 frontal views of the chest demonstrate endotracheal tube overlying
tracheal air column tip well above carina. Enteric catheter passes
below diaphragm tip and side port project over gastric fundus.
Cardiac silhouette is stable. Chronic scarring and volume loss
within the left chest. Diffuse emphysema. No airspace disease,
effusion, or pneumothorax.
IMPRESSION: 1. No complication after intubation.
2. Stable emphysema and bilateral scarring.

## 2021-10-22 IMAGING — DX DG ABDOMEN 1V
1 series · 1 of 1 positions shown · non-contrast
Comparison: 05/29/2019 CT

CLINICAL DATA: Vomiting

EXAM:
ABDOMEN - 1 VIEW

[abdomen supine]
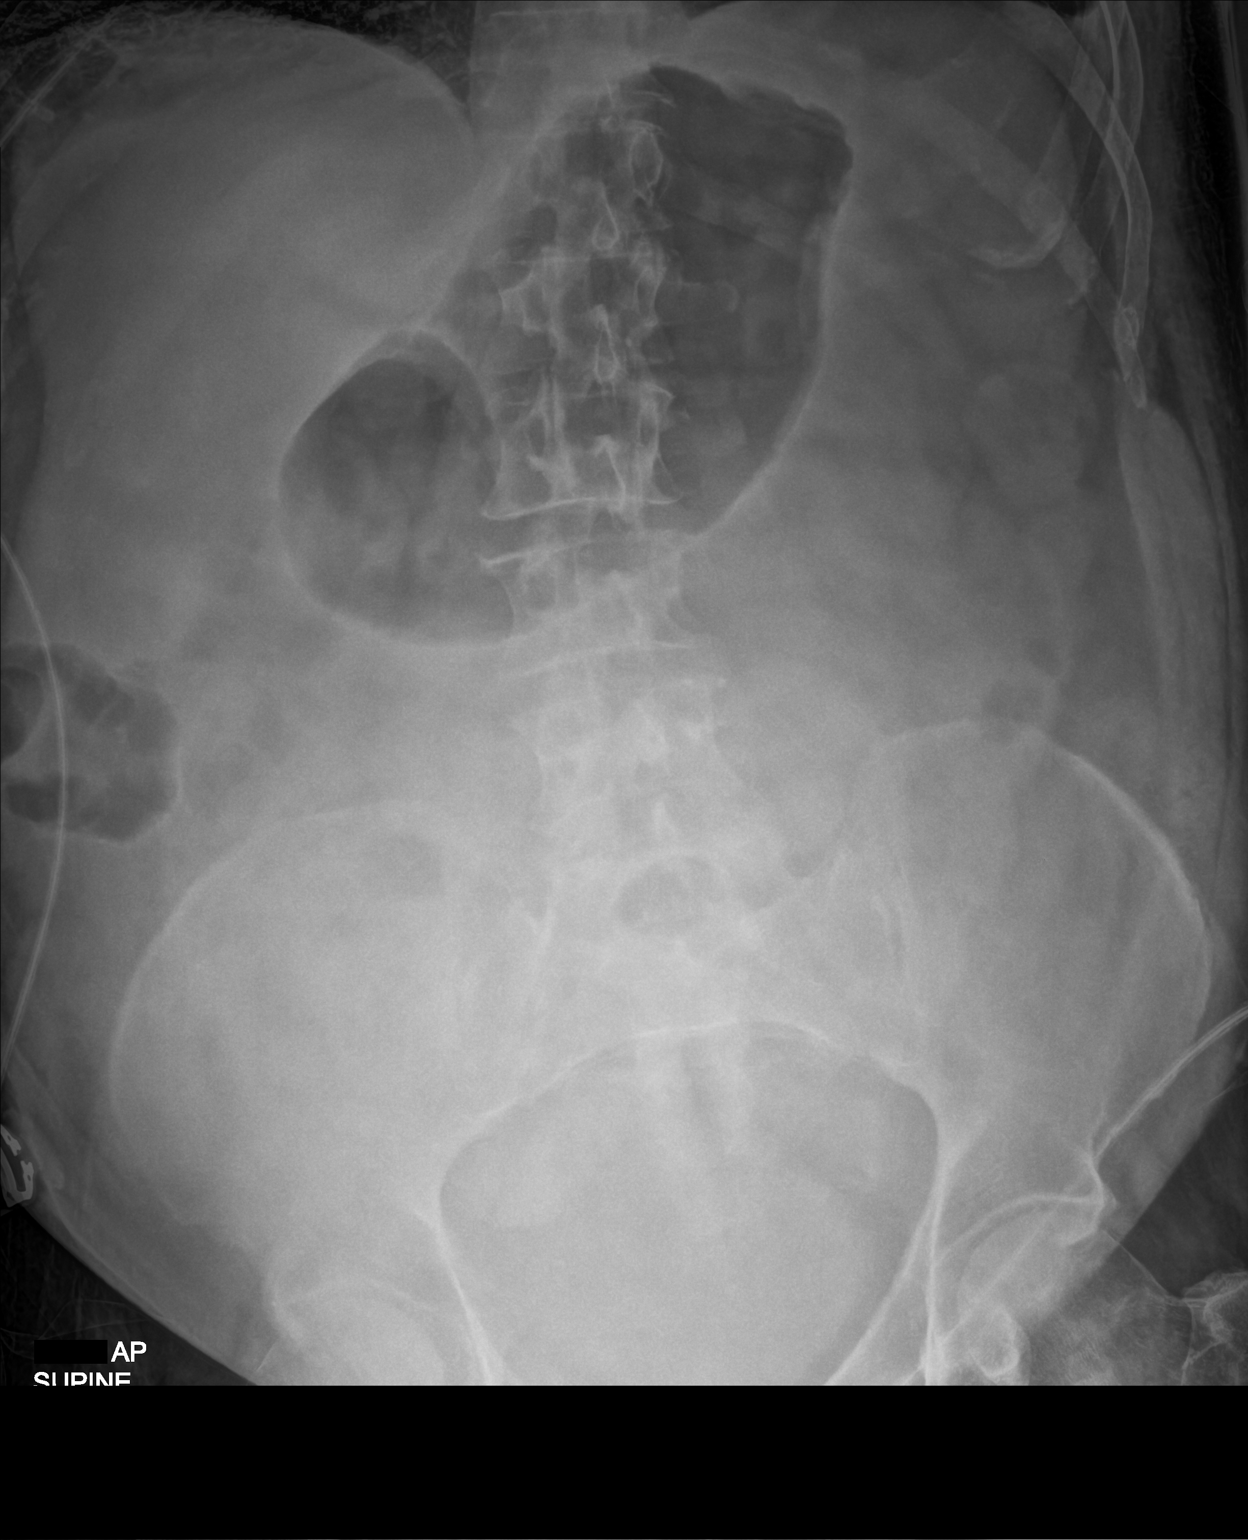

[1 of 1 positions shown; findings below may reference images not displayed]

FINDINGS: Moderate gaseous distention of the stomach. This could represent air
swallowing, gastroparesis, or outlet obstruction. Scattered gas and
stool in the colon. No small or large bowel distention. No
radiopaque stones. Visualized bones appear intact. Degenerative
changes in the lumbar spine.
IMPRESSION: Moderate gaseous distention of the stomach. No evidence of small
bowel obstruction.

## 2021-10-23 IMAGING — CT CT ABD-PELV W/ CM
2 of 5 series · 16 of 46 positions shown, 18 images · IV contrast (Omnipaque or Isovue)
Comparison: CT abdomen pelvis dated 05/29/2019.

CLINICAL DATA: 59-year-old male with abdominal distention, nausea
vomiting. Concern for bowel obstruction.

EXAM:
CT ABDOMEN AND PELVIS WITH CONTRAST
TECHNIQUE: Multidetector CT imaging of the abdomen and pelvis was performed
using the standard protocol following bolus administration of
intravenous contrast.
CONTRAST:  100mL OMNIPAQUE IOHEXOL 300 MG/ML  SOLN

[Series 2: axial st · axial · 0.87mm/px · z∈[+759,+1169]mm · 13 of 92 slices shown, 15 images]
[im 5/92  soft-tissue]
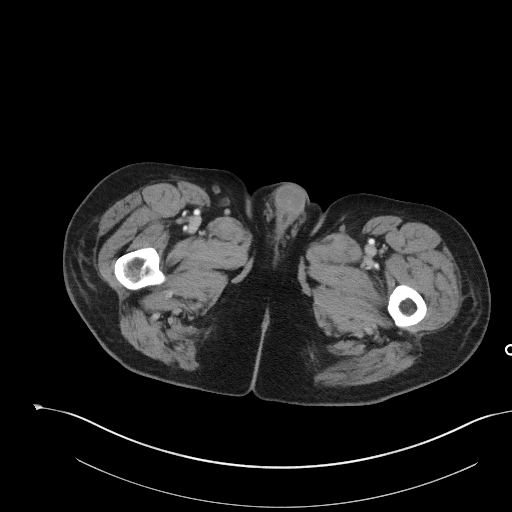
[im 5/92  bone]
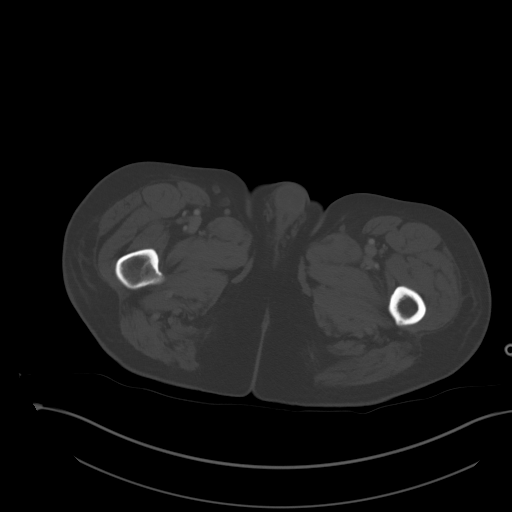
[im 15/92  soft-tissue]
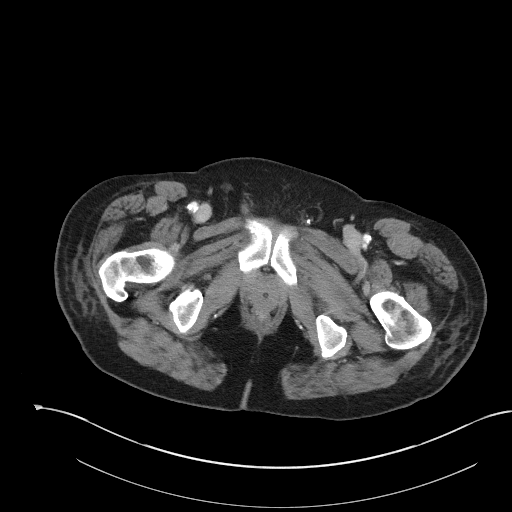
[im 20/92  soft-tissue]
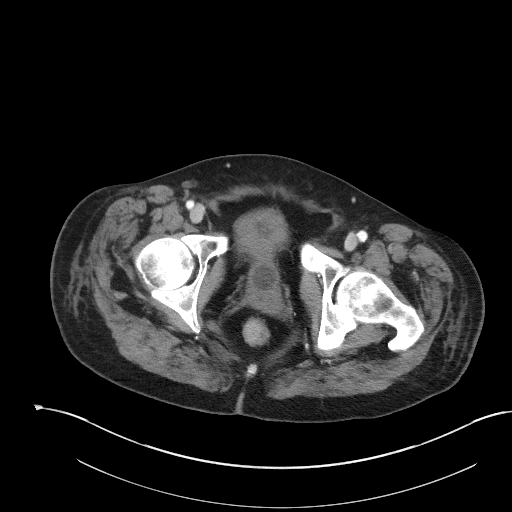
[im 24/92  soft-tissue]
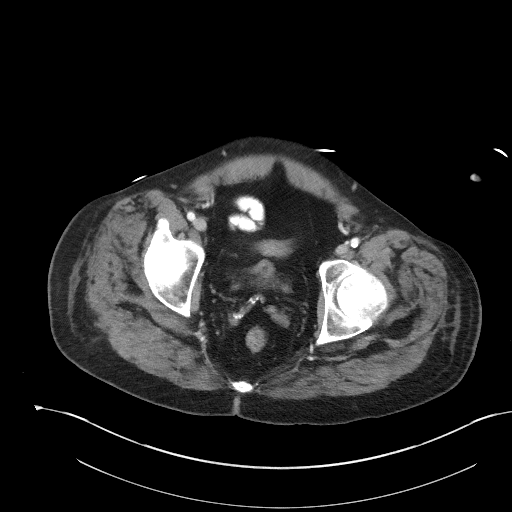
[im 34/92  soft-tissue]
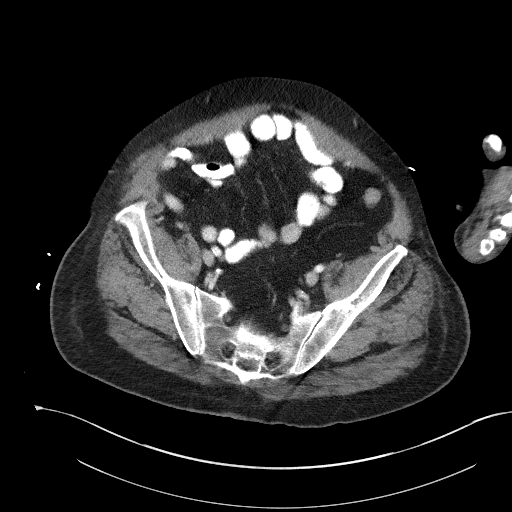
[im 39/92  soft-tissue]
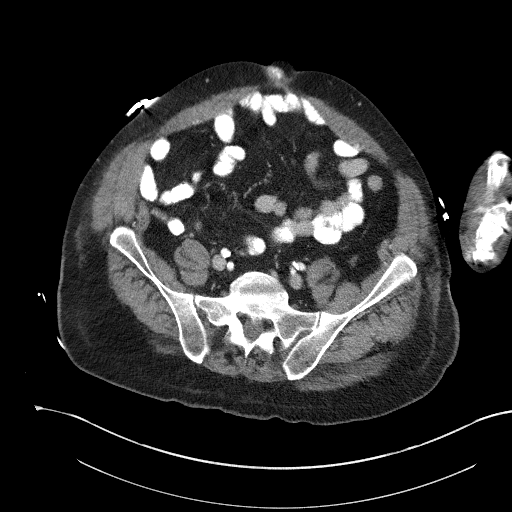
[im 48/92  soft-tissue]
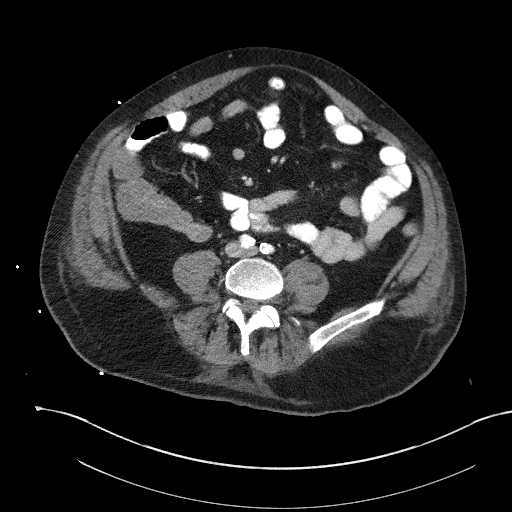
[im 53/92  soft-tissue]
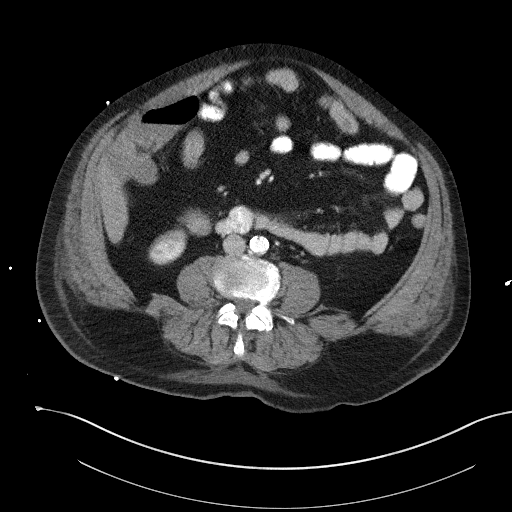
[im 58/92  soft-tissue]
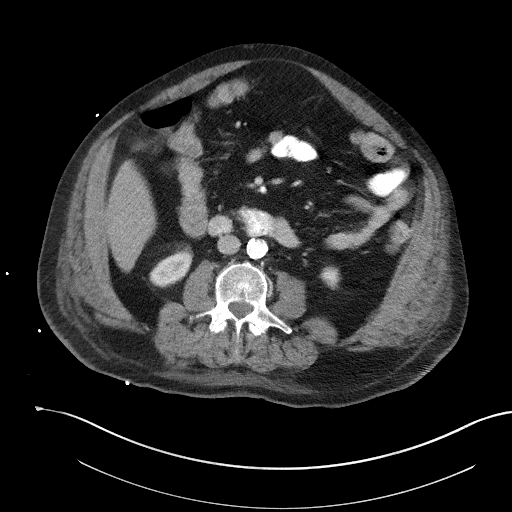
[im 58/92  bone]
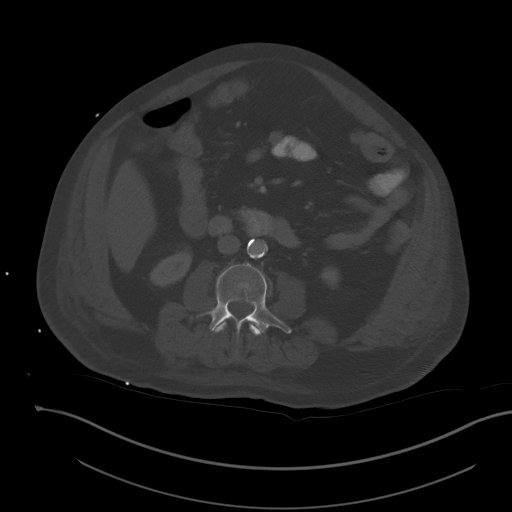
[im 68/92  soft-tissue]
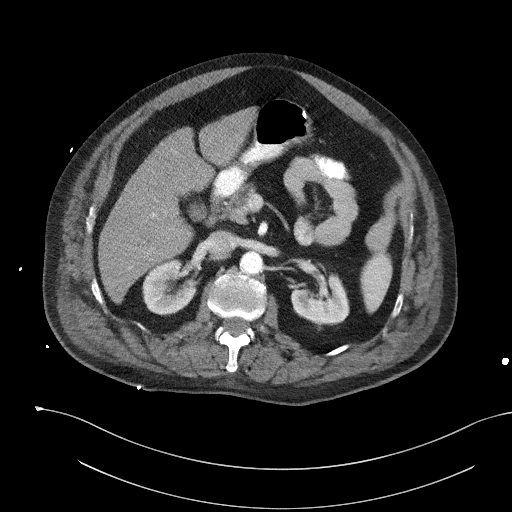
[im 72/92  soft-tissue]
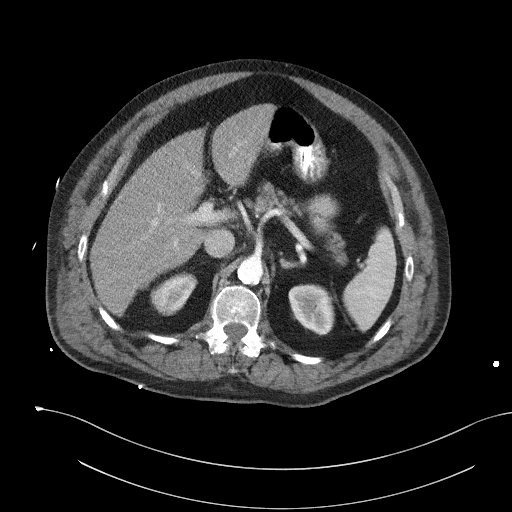
[im 77/92  soft-tissue]
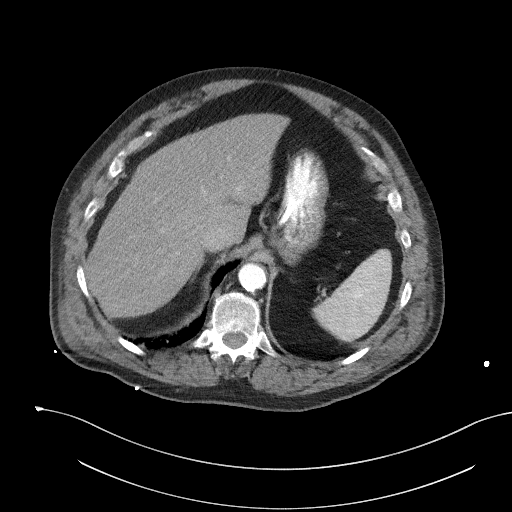
[im 87/92  soft-tissue]
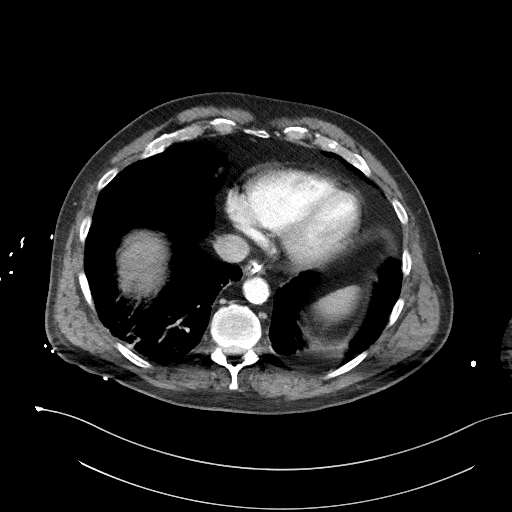

[Series 5: coronal st · coronal · 0.80mm/px · 3 of 119 slices shown]
[im 40/119  soft-tissue]
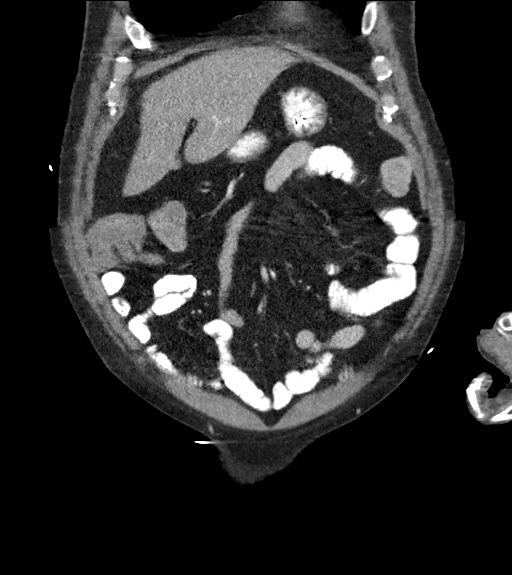
[im 53/119  soft-tissue]
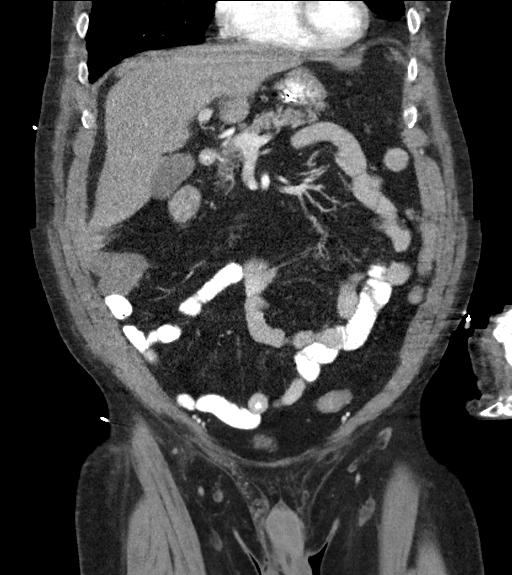
[im 66/119  soft-tissue]
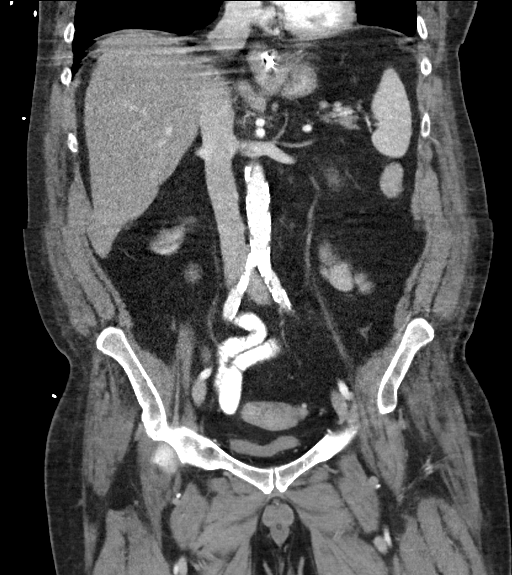

[16 of 46 positions shown; findings below may reference images not displayed]

FINDINGS: Lower chest: Emphysema with areas of atelectasis/scarring. Faint
diffuse parenchymal densities may represent atelectasis or sequela
of prior atypical infection.

No intra-abdominal free air or free fluid.

Hepatobiliary: Probable mild fatty liver. No intrahepatic biliary
dilatation. The gallbladder is unremarkable.

Pancreas: Unremarkable. No pancreatic ductal dilatation or
surrounding inflammatory changes.

Spleen: Normal in size without focal abnormality.

Adrenals/Urinary Tract: The adrenal glands unremarkable. Mild
bilateral renal parenchyma atrophy. There is no hydronephrosis on
either side. There is symmetric enhancement and excretion of
contrast by both kidneys. The visualized ureters appear
unremarkable. The urinary bladder is decompressed around a Foley
catheter.

Stomach/Bowel: An enteric tube is noted with tip in the body of the
stomach. There are small scattered sigmoid diverticula without
active inflammatory changes. There is no bowel obstruction or active
inflammation. The appendix is normal.

Vascular/Lymphatic: Advanced aortoiliac atherosclerotic disease. The
IVC is unremarkable. No portal venous gas. There is no adenopathy.

Reproductive: The prostate and seminal vesicles are grossly
unremarkable.

Other: Small umbilical hernia containing a short segment of small
bowel without evidence of obstruction or inflammation.

Musculoskeletal: Mild degenerative changes of the spine. No acute
osseous pathology.
IMPRESSION: 1. No acute intra-abdominal or pelvic pathology. No bowel
obstruction. Normal appendix.
2. Small scattered sigmoid diverticula.
3. Aortic Atherosclerosis ([id number]) and Emphysema ([id number].U).

## 2021-10-24 IMAGING — DX DG CHEST 1V PORT
1 series · 1 of 1 positions shown · non-contrast
Comparison: 08/04/2020, CT 02/17/2020

CLINICAL DATA: Progressive dyspnea

EXAM:
PORTABLE CHEST 1 VIEW

[chest ap]
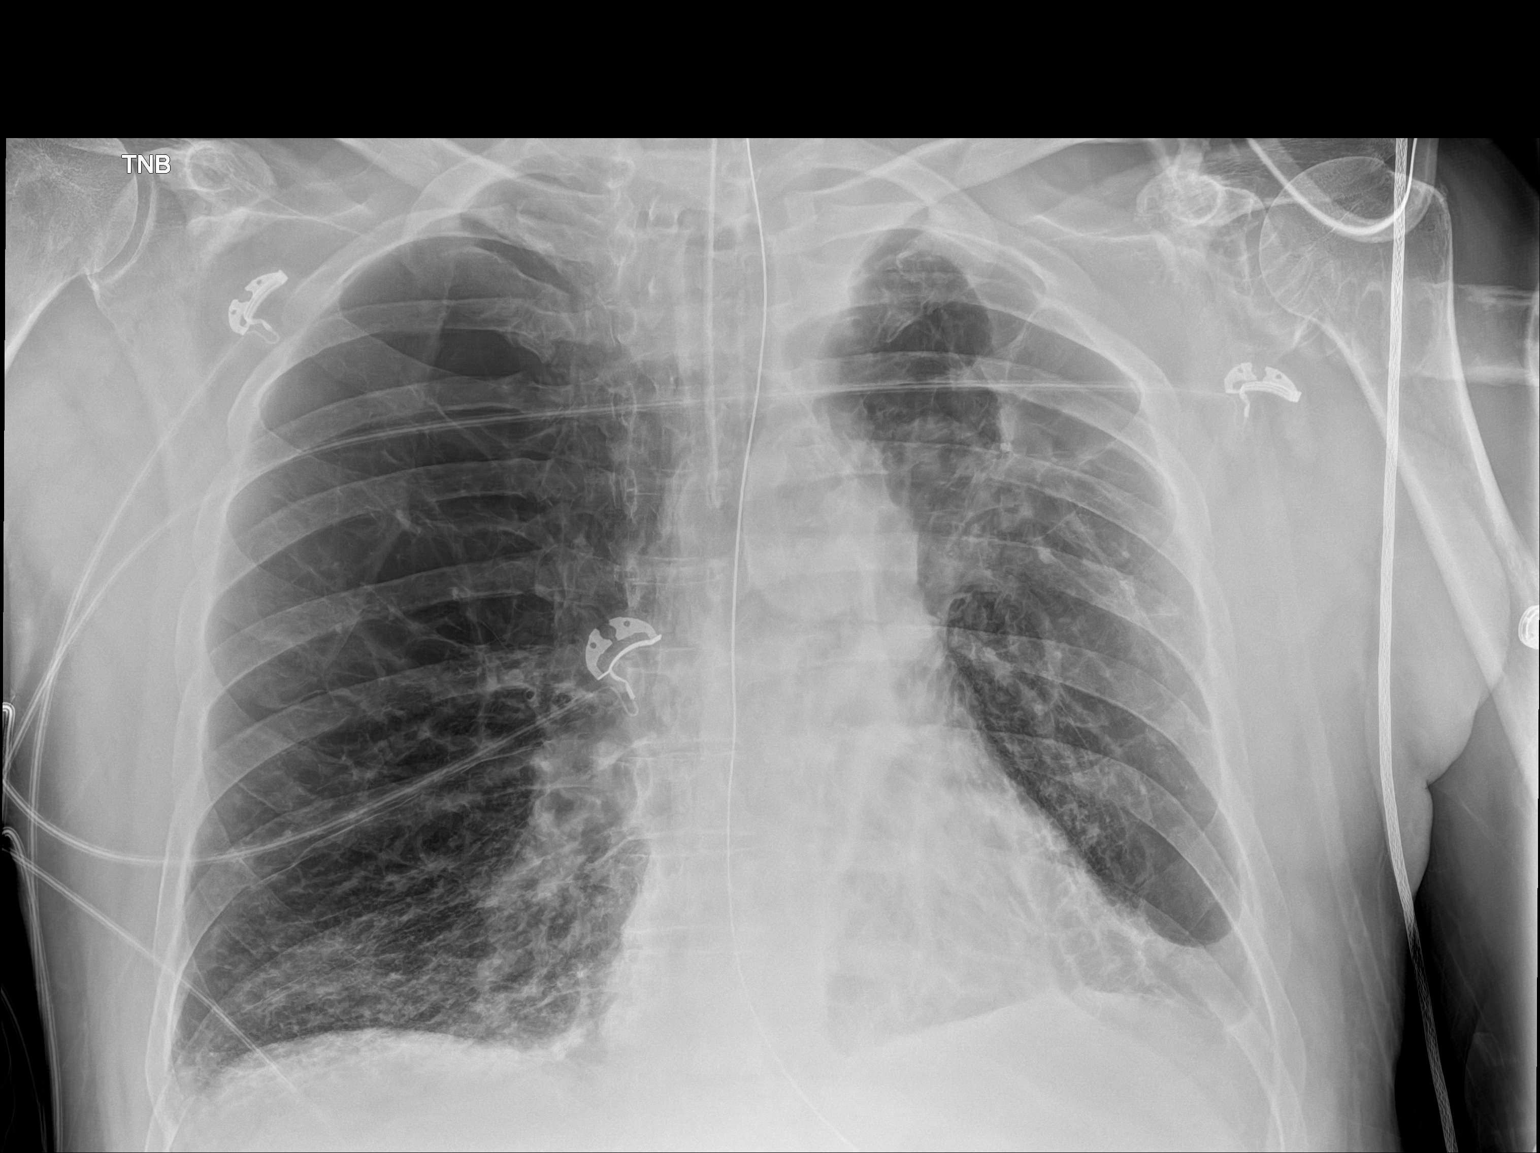

[1 of 1 positions shown; findings below may reference images not displayed]

FINDINGS: Endotracheal tube seen 2.9 cm above the carina. Nasogastric tube
extends into the upper abdomen beyond the margin of the examination.
Changes of severe centrilobular emphysema are again identified.
Parenchymal scarring within the left apex is unchanged. Left
suprahilar rounded opacity appears stable from a immediate prior
examination, however, appears progressed from a prior examination of
02/17/2020 and may represent residual opacity related to the
cavitary lesion noted on a prior CT examination. This is not well
assessed on this examination. No pneumothorax or pleural effusion.
Cardiac size within normal limits.
IMPRESSION: Support tubes in expected position.

Severe emphysema.

Left suprahilar mass, possibly the residua of prior cavitary
pneumonia, but indeterminate on this examination. This could be
further assessed with CT imaging once the patient's acute issues
have resolved.
